# Patient Record
Sex: Male | Born: 1940 | Race: White | Hispanic: No | Smoking: Former smoker
Health system: Southern US, Community
[De-identification: ages and names within clinical notes are randomized; demographics above are authoritative.]

## PROBLEM LIST (undated history)

## (undated) DIAGNOSIS — I480 Paroxysmal atrial fibrillation: Secondary | ICD-10-CM

## (undated) DIAGNOSIS — E11319 Type 2 diabetes mellitus with unspecified diabetic retinopathy without macular edema: Secondary | ICD-10-CM

## (undated) DIAGNOSIS — I2510 Atherosclerotic heart disease of native coronary artery without angina pectoris: Secondary | ICD-10-CM

## (undated) DIAGNOSIS — I4891 Unspecified atrial fibrillation: Secondary | ICD-10-CM

## (undated) DIAGNOSIS — J986 Disorders of diaphragm: Secondary | ICD-10-CM

## (undated) DIAGNOSIS — N200 Calculus of kidney: Secondary | ICD-10-CM

## (undated) DIAGNOSIS — Z8739 Personal history of other diseases of the musculoskeletal system and connective tissue: Secondary | ICD-10-CM

## (undated) DIAGNOSIS — Z9989 Dependence on other enabling machines and devices: Secondary | ICD-10-CM

## (undated) DIAGNOSIS — I428 Other cardiomyopathies: Secondary | ICD-10-CM

## (undated) DIAGNOSIS — C61 Malignant neoplasm of prostate: Secondary | ICD-10-CM

## (undated) DIAGNOSIS — I10 Essential (primary) hypertension: Secondary | ICD-10-CM

## (undated) DIAGNOSIS — H269 Unspecified cataract: Secondary | ICD-10-CM

## (undated) DIAGNOSIS — C434 Malignant melanoma of scalp and neck: Secondary | ICD-10-CM

## (undated) DIAGNOSIS — M1990 Unspecified osteoarthritis, unspecified site: Secondary | ICD-10-CM

## (undated) DIAGNOSIS — E119 Type 2 diabetes mellitus without complications: Secondary | ICD-10-CM

## (undated) DIAGNOSIS — E785 Hyperlipidemia, unspecified: Secondary | ICD-10-CM

## (undated) DIAGNOSIS — G4733 Obstructive sleep apnea (adult) (pediatric): Secondary | ICD-10-CM

## (undated) DIAGNOSIS — H35039 Hypertensive retinopathy, unspecified eye: Secondary | ICD-10-CM

## (undated) DIAGNOSIS — I5032 Chronic diastolic (congestive) heart failure: Secondary | ICD-10-CM

## (undated) HISTORY — PX: LAPAROSCOPIC CHOLECYSTECTOMY: SUR755

## (undated) HISTORY — DX: Dependence on other enabling machines and devices: Z99.89

## (undated) HISTORY — DX: Type 2 diabetes mellitus with unspecified diabetic retinopathy without macular edema: E11.319

## (undated) HISTORY — PX: FINE NEEDLE ASPIRATION: SHX406

## (undated) HISTORY — DX: Chronic diastolic (congestive) heart failure: I50.32

## (undated) HISTORY — DX: Obstructive sleep apnea (adult) (pediatric): G47.33

## (undated) HISTORY — DX: Essential (primary) hypertension: I10

## (undated) HISTORY — PX: ABDOMINAL HERNIA REPAIR: SHX539

## (undated) HISTORY — PX: MELANOMA EXCISION: SHX5266

## (undated) HISTORY — DX: Unspecified cataract: H26.9

## (undated) HISTORY — DX: Hypertensive retinopathy, unspecified eye: H35.039

## (undated) HISTORY — PX: PROSTATECTOMY: SHX69

## (undated) HISTORY — PX: SHOULDER OPEN ROTATOR CUFF REPAIR: SHX2407

## (undated) HISTORY — PX: EXCISIONAL HEMORRHOIDECTOMY: SHX1541

## (undated) HISTORY — PX: HERNIA REPAIR: SHX51

## (undated) HISTORY — DX: Paroxysmal atrial fibrillation: I48.0

## (undated) HISTORY — DX: Unspecified atrial fibrillation: I48.91

---

## 2001-06-12 ENCOUNTER — Ambulatory Visit (HOSPITAL_COMMUNITY): Admission: RE | Admit: 2001-06-12 | Discharge: 2001-06-12 | Payer: Self-pay | Admitting: Orthopaedic Surgery

## 2001-06-12 ENCOUNTER — Encounter: Payer: Self-pay | Admitting: Orthopaedic Surgery

## 2003-03-15 ENCOUNTER — Encounter (HOSPITAL_COMMUNITY): Admission: RE | Admit: 2003-03-15 | Discharge: 2003-04-14 | Payer: Self-pay | Admitting: Orthopaedic Surgery

## 2003-03-15 ENCOUNTER — Encounter: Payer: Self-pay | Admitting: Orthopaedic Surgery

## 2003-09-28 ENCOUNTER — Other Ambulatory Visit: Admission: RE | Admit: 2003-09-28 | Discharge: 2003-09-28 | Payer: Self-pay | Admitting: Urology

## 2005-01-18 ENCOUNTER — Other Ambulatory Visit: Admission: RE | Admit: 2005-01-18 | Discharge: 2005-01-18 | Payer: Self-pay | Admitting: Urology

## 2005-09-20 ENCOUNTER — Other Ambulatory Visit: Admission: RE | Admit: 2005-09-20 | Discharge: 2005-09-20 | Payer: Self-pay | Admitting: Urology

## 2005-11-07 ENCOUNTER — Ambulatory Visit (HOSPITAL_COMMUNITY): Admission: RE | Admit: 2005-11-07 | Discharge: 2005-11-07 | Payer: Self-pay | Admitting: Family Medicine

## 2006-04-25 ENCOUNTER — Ambulatory Visit (HOSPITAL_COMMUNITY): Admission: RE | Admit: 2006-04-25 | Discharge: 2006-04-25 | Payer: Self-pay | Admitting: Family Medicine

## 2006-10-21 ENCOUNTER — Ambulatory Visit (HOSPITAL_COMMUNITY): Admission: RE | Admit: 2006-10-21 | Discharge: 2006-10-22 | Payer: Self-pay | Admitting: Surgery

## 2008-09-17 HISTORY — PX: OTHER SURGICAL HISTORY: SHX169

## 2009-05-25 HISTORY — PX: DOPPLER ECHOCARDIOGRAPHY: SHX263

## 2010-03-27 ENCOUNTER — Ambulatory Visit (HOSPITAL_COMMUNITY): Admission: RE | Admit: 2010-03-27 | Discharge: 2010-03-27 | Payer: Self-pay | Admitting: Family Medicine

## 2010-10-15 DIAGNOSIS — G4733 Obstructive sleep apnea (adult) (pediatric): Secondary | ICD-10-CM

## 2010-10-15 HISTORY — DX: Obstructive sleep apnea (adult) (pediatric): G47.33

## 2011-02-22 ENCOUNTER — Ambulatory Visit (HOSPITAL_COMMUNITY)
Admission: RE | Admit: 2011-02-22 | Discharge: 2011-02-22 | Disposition: A | Discharge status: Home / Self Care | Payer: Medicare Other | Source: Ambulatory Visit | Attending: Family Medicine | Admitting: Family Medicine

## 2011-02-22 ENCOUNTER — Other Ambulatory Visit (HOSPITAL_COMMUNITY): Payer: Self-pay | Admitting: Family Medicine

## 2011-02-22 DIAGNOSIS — W19XXXA Unspecified fall, initial encounter: Secondary | ICD-10-CM

## 2011-02-22 DIAGNOSIS — S2249XA Multiple fractures of ribs, unspecified side, initial encounter for closed fracture: Secondary | ICD-10-CM | POA: Insufficient documentation

## 2011-02-22 DIAGNOSIS — R0789 Other chest pain: Secondary | ICD-10-CM | POA: Insufficient documentation

## 2011-02-22 DIAGNOSIS — X58XXXA Exposure to other specified factors, initial encounter: Secondary | ICD-10-CM | POA: Insufficient documentation

## 2011-03-02 NOTE — Op Note (Signed)
NAME:  Lucas Dudley, Lucas Dudley NO.:  [id number]   MEDICAL RECORD NO.:  [id number]          PATIENT TYPE:  AMB   LOCATION:  DAY                          FACILITY:  [facility]   PHYSICIAN:  Velora Heckler, MD      DATE OF BIRTH:  [birth year]   DATE OF PROCEDURE:  10/21/2006  DATE OF DISCHARGE:                               OPERATIVE REPORT   PREOPERATIVE DIAGNOSIS:  Ventral incisional hernia.   POSTOPERATIVE DIAGNOSIS:  Ventral incisional hernia.   PROCEDURE:  Repair ventral incisional hernia with the Ethicon Proceed  mesh.   SURGEON:  Velora Heckler, MD, FACS   ASSISTANT:  Claud Kelp, MD, FACS   ANESTHESIA:  General.   ESTIMATED BLOOD LOSS:  Minimal.   PREPARATION:  Betadine.   COMPLICATIONS:  None.   INDICATIONS:  Patient is 70 year old white male from [place], [place].  The patient had undergone robotic prostatectomy in [place], [place], in [place].  He developed an incisional hernia  above the level of the umbilicus.  This has gradually enlarged.  He now  comes to surgery for repair.   DESCRIPTION OF PROCEDURE:  Procedure is done in OR #11 at the [facility].  The patient is brought to the operating room,  placed in supine position on the operating room table.  Following  administration of general anesthesia, the patient is prepped and draped  in the usual strict aseptic fashion.  After ascertaining that an  adequate level of anesthesia been obtained, a incision is made in the  left upper quadrant with a #15 blade.  Using an OptiVu port and a 10 mm  fiberoptic scope, the peritoneal cavity is entered without complication.  Abdomen is then insufflated with carbon dioxide.  Laparoscope was  reintroduced through the trocar and into the peritoneal cavity.  There  are adhesions to the site of hernia in the mid abdominal wall.  There  are adhesions in the left lower quadrant.  A 5 mm port is placed in the  left lower quadrant.   A 10 mm port is placed in the right lower quadrant  and a 5 mm port is placed in the right upper quadrant during the course  of the procedure.  Adhesions are taken down with blunt dissection and  hemostasis obtained with the electrocautery.  Entire abdominal wall is  cleared of adhesions.  Fascial defect is above the level of the  umbilicus.  The margins of the defect are marked and then a 4 cm overlap  is accounted for.  A 20 x 15 cm sheet of Ethicon Proceed mesh is  selected.  Eight 0 Novofil sutures are placed circumferentially.  Corresponding incisions are made on the skin.  Mesh is rolled and  inserted through a 10 mm trocar into the peritoneal cavity.  It is  unrolled and oriented properly.  Sutures are retrieved through stab  wounds at eight positions circumferentially around the margins of the  mesh.  Sutures are then pulled taut elevating the mesh against the  anterior abdominal wall with  nice approximation and no significant  redundancy.  Sutures are all tied securely and suture tails excised.  Mesh is secured to the abdominal wall with two concentric circles of  titanium tacks.  Good hemostasis is noted.  Ports are removed under  direct vision.  Good hemostasis is noted at port sites.  Pneumoperitoneum is released.  All ports are removed.  Port sites are  anesthetized with local anesthetic.  Port site wounds are closed with  interrupted 4-0 Monocryl subcuticular sutures.  All wounds are washed  and dried and Benzoin and Steri-Strips are applied to all wounds.  Dressings are applied.  The patient is awakened from anesthesia and  brought to the recovery room in stable condition.  The patient tolerated  the procedure well.      Velora Heckler, MD  Electronically Signed     TMG/MEDQ  D:  10/21/2006  T:  10/21/2006  Job:  [id number]   cc:   Marcha Solders, M.D.   Mila Homer. Sudie Bailey, M.D.  Fax: [phone]

## 2011-09-17 ENCOUNTER — Other Ambulatory Visit (HOSPITAL_COMMUNITY): Payer: Self-pay | Admitting: Family Medicine

## 2011-09-17 ENCOUNTER — Ambulatory Visit (HOSPITAL_COMMUNITY)
Admission: RE | Admit: 2011-09-17 | Discharge: 2011-09-17 | Disposition: A | Discharge status: Home / Self Care | Payer: Medicare Other | Source: Ambulatory Visit | Attending: Family Medicine | Admitting: Family Medicine

## 2011-09-17 DIAGNOSIS — M25529 Pain in unspecified elbow: Secondary | ICD-10-CM

## 2011-09-17 DIAGNOSIS — M25429 Effusion, unspecified elbow: Secondary | ICD-10-CM | POA: Insufficient documentation

## 2012-01-28 ENCOUNTER — Emergency Department (HOSPITAL_COMMUNITY)
Admission: EM | Admit: 2012-01-28 | Discharge: 2012-01-28 | Disposition: A | Discharge status: Home / Self Care | Payer: Medicare Other | Attending: Emergency Medicine | Admitting: Emergency Medicine

## 2012-01-28 ENCOUNTER — Encounter (HOSPITAL_COMMUNITY): Payer: Self-pay | Admitting: *Deleted

## 2012-01-28 DIAGNOSIS — H409 Unspecified glaucoma: Secondary | ICD-10-CM | POA: Insufficient documentation

## 2012-01-28 DIAGNOSIS — I10 Essential (primary) hypertension: Secondary | ICD-10-CM | POA: Insufficient documentation

## 2012-01-28 DIAGNOSIS — E119 Type 2 diabetes mellitus without complications: Secondary | ICD-10-CM | POA: Insufficient documentation

## 2012-01-28 DIAGNOSIS — E785 Hyperlipidemia, unspecified: Secondary | ICD-10-CM | POA: Insufficient documentation

## 2012-01-28 DIAGNOSIS — R0789 Other chest pain: Secondary | ICD-10-CM

## 2012-01-28 DIAGNOSIS — R071 Chest pain on breathing: Secondary | ICD-10-CM | POA: Insufficient documentation

## 2012-01-28 DIAGNOSIS — I252 Old myocardial infarction: Secondary | ICD-10-CM | POA: Insufficient documentation

## 2012-01-28 HISTORY — DX: Hyperlipidemia, unspecified: E78.5

## 2012-01-28 HISTORY — DX: Essential (primary) hypertension: I10

## 2012-01-28 LAB — CBC
MCH: 29 pg (ref 26.0–34.0)
MCHC: 33.1 g/dL (ref 30.0–36.0)
MCV: 87.8 fL (ref 78.0–100.0)
Platelets: 254 10*3/uL (ref 150–400)
RBC: 4.51 MIL/uL (ref 4.22–5.81)
RDW: 13.1 % (ref 11.5–15.5)

## 2012-01-28 LAB — DIFFERENTIAL
Basophils Relative: 0 % (ref 0–1)
Eosinophils Absolute: 0.2 10*3/uL (ref 0.0–0.7)
Eosinophils Relative: 3 % (ref 0–5)
Lymphs Abs: 1.6 10*3/uL (ref 0.7–4.0)

## 2012-01-28 LAB — POCT I-STAT TROPONIN I

## 2012-01-28 LAB — BASIC METABOLIC PANEL
Calcium: 9.3 mg/dL (ref 8.4–10.5)
GFR calc non Af Amer: 71 mL/min — ABNORMAL LOW (ref >90)
Glucose, Bld: 104 mg/dL — ABNORMAL HIGH (ref 70–99)
Sodium: 135 mEq/L (ref 135–145)

## 2012-01-28 MED ORDER — NITROGLYCERIN 0.4 MG SL SUBL
0.4000 mg | SUBLINGUAL_TABLET | Freq: Once | SUBLINGUAL | Status: AC
  Administered 2012-01-28: 0.4 mg via SUBLINGUAL

## 2012-01-28 MED ORDER — MORPHINE SULFATE 2 MG/ML IJ SOLN
2.0000 mg | Freq: Once | INTRAMUSCULAR | Status: DC
  Filled 2012-01-28: qty 1

## 2012-01-28 MED ORDER — NITROGLYCERIN 0.4 MG SL SUBL
0.4000 mg | SUBLINGUAL_TABLET | Freq: Once | SUBLINGUAL | Status: AC
  Administered 2012-01-28: 0.4 mg via SUBLINGUAL
  Filled 2012-01-28: qty 25

## 2012-01-28 MED ORDER — ONDANSETRON HCL 4 MG/2ML IJ SOLN
4.0000 mg | Freq: Once | INTRAMUSCULAR | Status: DC
  Filled 2012-01-28: qty 2

## 2012-01-28 NOTE — ED Notes (Signed)
Pt stated the pain is the same after ntg, when he presses a certain area on right chest wall, can make pain go away

## 2012-01-28 NOTE — ED Notes (Signed)
Pt rates pain 2/10 on scale.

## 2012-01-28 NOTE — ED Notes (Signed)
Pt declined wheelchair to car.

## 2012-01-28 NOTE — ED Notes (Signed)
Per patient's request (nurse approved) - given ice chips

## 2012-01-28 NOTE — ED Provider Notes (Addendum)
History     CSN: [id number]  Arrival date & time 01/28/12  1324   First MD Initiated Contact with Patient 01/28/12 1338      Chief Complaint  Patient presents with  . Chest Pain    (Consider location/radiation/quality/duration/timing/severity/associated sxs/prior treatment) HPI Lucas Dudley is a 71 y.o. male who presents to the Emergency Department complaining of  Chest pain x 2 days. States he has had pain across his anterior chest for two days intermittently. It is worse after eating.Denies fever, chills, nausea, vomiting, shortness of breath. He takes 2 aspirin daily and has taken his daily dose today.  PCP Dr. Sudie Bailey Past Medical History  Diagnosis Date  . Myocardial infarct   . Diabetes mellitus   . Hypertension   . Glaucoma   . Hyperlipidemia     Past Surgical History  Procedure Date  . Cholecystectomy   . Hernia repair   . Shoulder surgery     History reviewed. No pertinent family history.  History  Substance Use Topics  . Smoking status: Former Games developer  . Smokeless tobacco: Not on file  . Alcohol Use: No      Review of Systems  Constitutional: Negative for fever.       10 Systems reviewed and are negative for acute change except as noted in the HPI.  HENT: Negative for congestion.   Eyes: Negative for discharge and redness.  Respiratory: Negative for cough and shortness of breath.   Cardiovascular: Positive for chest pain.  Gastrointestinal: Negative for vomiting and abdominal pain.  Musculoskeletal: Negative for back pain.  Skin: Negative for rash.  Neurological: Negative for syncope, numbness and headaches.  Psychiatric/Behavioral:       No behavior change.    Allergies  Tape  Home Medications  No current outpatient prescriptions on file.  BP 143/70  Pulse 61  Temp(Src) 98.2 F (36.8 C) (Oral)  Resp 18  Ht 5\' 11"  (1.803 m)  Wt 152 lb (68.947 kg)  BMI 21.20 kg/m2  SpO2 96%  Physical Exam  Nursing note and vitals  reviewed. Constitutional:       Awake, alert, nontoxic appearance.  HENT:  Head: Atraumatic.  Eyes: Right eye exhibits no discharge. Left eye exhibits no discharge.  Neck: Neck supple.  Cardiovascular: Normal rate, normal heart sounds and intact distal pulses.   Pulmonary/Chest: Effort normal. He exhibits no tenderness.  Abdominal: Soft. There is no tenderness. There is no rebound.  Musculoskeletal: He exhibits no tenderness.       Baseline ROM, no obvious new focal weakness.  Neurological:       Mental status and motor strength appears baseline for patient and situation.  Skin: No rash noted.  Psychiatric: He has a normal mood and affect.    ED Course  Procedures (including critical care time) Results for orders placed during the hospital encounter of 01/28/12  CBC      Component Value Range   WBC 7.1  4.0 - 10.5 (K/uL)   RBC 4.51  4.22 - 5.81 (MIL/uL)   Hemoglobin 13.1  13.0 - 17.0 (g/dL)   HCT 40.9  81.1 - 91.4 (%)   MCV 87.8  78.0 - 100.0 (fL)   MCH 29.0  26.0 - 34.0 (pg)   MCHC 33.1  30.0 - 36.0 (g/dL)   RDW 78.2  95.6 - 21.3 (%)   Platelets 254  150 - 400 (K/uL)  DIFFERENTIAL      Component Value Range   Neutrophils Relative 67  43 - 77 (%)   Neutro Abs 4.7  1.7 - 7.7 (K/uL)   Lymphocytes Relative 23  12 - 46 (%)   Lymphs Abs 1.6  0.7 - 4.0 (K/uL)   Monocytes Relative 7  3 - 12 (%)   Monocytes Absolute 0.5  0.1 - 1.0 (K/uL)   Eosinophils Relative 3  0 - 5 (%)   Eosinophils Absolute 0.2  0.0 - 0.7 (K/uL)   Basophils Relative 0  0 - 1 (%)   Basophils Absolute 0.0  0.0 - 0.1 (K/uL)  BASIC METABOLIC PANEL      Component Value Range   Sodium 135  135 - 145 (mEq/L)   Potassium 4.6  3.5 - 5.1 (mEq/L)   Chloride 96  96 - 112 (mEq/L)   CO2 29  19 - 32 (mEq/L)   Glucose, Bld 104 (*) 70 - 99 (mg/dL)   BUN 26 (*) 6 - 23 (mg/dL)   Creatinine, Ser 0.45  0.50 - 1.35 (mg/dL)   Calcium 9.3  8.4 - 40.9 (mg/dL)   GFR calc non Af Amer 71 (*) >90 (mL/min)   GFR calc Af Amer 83  (*) >90 (mL/min)  POCT I-STAT TROPONIN I      Component Value Range   Troponin i, poc 0.01  0.00 - 0.08 (ng/mL)   Comment 3             No results found.  Date: 01/28/2012  1336  Rate: 58  Rhythm: sinus bradycardia and 1st degree AV block  QRS Axis: left  Intervals: normal  ST/T Wave abnormalities: nonspecific ST/T changes  Conduction Disutrbances:first-degree A-V block  and right bundle branch block  Narrative Interpretation:   Old EKG Reviewed: none available   MDM  Patient with 2 days of anterior chest pain. Pain has been worse with eating. No response to nitroglycerin while here in the ER.Patient refused morphine. Pain has remained a 1-2/10 since arrival. EKG unremarkable, troponin negative. Labs unremarkable. Discussed results with the patient. He will continue to use aleve for pain. He will follow up with his PCP, Dr. Sudie Bailey.Pt stable in ED with no significant deterioration in condition.The patient appears reasonably screened and/or stabilized for discharge and I doubt any other medical condition or other [facility] requiring further screening, evaluation, or treatment in the ED at this time prior to discharge.  MDM Reviewed: nursing note and vitals Interpretation: labs and ECG          Nicoletta Dress. Colon Branch, MD 01/28/12 1751  Nicoletta Dress. Colon Branch, MD 03/04/12 [phone]

## 2012-01-28 NOTE — ED Notes (Signed)
Pt refused meds ordered for pain and nausea, stated he was "ok and didn't need all that"

## 2012-01-28 NOTE — Discharge Instructions (Signed)
Your blood work, EKG, heart numbers were normal here tonight.You may apply heat for comfort, and continue to use Aleve. If you have recurrent pain or develop other symptoms such as fever chills nausea vomiting diarrhea, return to the emergency room or see Dr. Sudie Bailey.   Chest Wall Pain Chest wall pain is pain in or around the bones and muscles of your chest. It may take up to 6 weeks to get better. It may take longer if you must stay physically active in your work and activities.  CAUSES  Chest wall pain may happen on its own. However, it may be caused by:  A viral illness like the flu.   Injury.   Coughing.   Exercise.   Arthritis.   Fibromyalgia.   Shingles.  HOME CARE INSTRUCTIONS   Avoid overtiring physical activity. Try not to strain or perform activities that cause pain. This includes any activities using your chest or your abdominal and side muscles, especially if heavy weights are used.   Put ice on the sore area.   Put ice in a plastic bag.   Place a towel between your skin and the bag.   Leave the ice on for 15 to 20 minutes per hour while awake for the first 2 days.   Only take over-the-counter or prescription medicines for pain, discomfort, or fever as directed by your caregiver.  SEEK IMMEDIATE MEDICAL CARE IF:   Your pain increases, or you are very uncomfortable.   You have a fever.   Your chest pain becomes worse.   You have new, unexplained symptoms.   You have nausea or vomiting.   You feel sweaty or lightheaded.   You have a cough with phlegm (sputum), or you cough up blood.  MAKE SURE YOU:   Understand these instructions.   Will watch your condition.   Will get help right away if you are not doing well or get worse.  Document Released: 10/01/2005 Document Revised: 09/20/2011 Document Reviewed: 05/28/2011 [facility] Patient Information 2012 [place], [place].

## 2012-01-28 NOTE — ED Notes (Signed)
Chest pain for 2 days , worse today,No sob, No N/v

## 2012-02-21 HISTORY — PX: NM MYOCAR PERF WALL MOTION: HXRAD629

## 2012-05-07 ENCOUNTER — Telehealth (INDEPENDENT_AMBULATORY_CARE_PROVIDER_SITE_OTHER): Payer: Self-pay | Admitting: *Deleted

## 2012-05-07 NOTE — Telephone Encounter (Signed)
Lucas Dudley is asking when his next Colonoscopy is? He saw his PCP and he feels that the patient should have another one. Lucas Dudley may be reached at [phone]

## 2012-05-07 NOTE — Telephone Encounter (Signed)
TCS due 08/2016, patient aware

## 2012-12-29 ENCOUNTER — Ambulatory Visit (HOSPITAL_COMMUNITY)
Admission: RE | Admit: 2012-12-29 | Discharge: 2012-12-29 | Disposition: A | Discharge status: Home / Self Care | Payer: Medicare Other | Source: Ambulatory Visit | Attending: Family Medicine | Admitting: Family Medicine

## 2012-12-29 ENCOUNTER — Other Ambulatory Visit (HOSPITAL_COMMUNITY): Payer: Self-pay | Admitting: Family Medicine

## 2012-12-29 DIAGNOSIS — R059 Cough, unspecified: Secondary | ICD-10-CM | POA: Insufficient documentation

## 2012-12-29 DIAGNOSIS — J209 Acute bronchitis, unspecified: Secondary | ICD-10-CM

## 2013-07-09 ENCOUNTER — Encounter: Payer: Self-pay | Admitting: *Deleted

## 2013-07-10 ENCOUNTER — Ambulatory Visit (INDEPENDENT_AMBULATORY_CARE_PROVIDER_SITE_OTHER): Payer: Medicare Other | Admitting: Cardiovascular Disease

## 2013-07-10 ENCOUNTER — Encounter: Payer: Self-pay | Admitting: Cardiovascular Disease

## 2013-07-10 VITALS — BP 140/70 | HR 69 | Ht 71.0 in | Wt 244.0 lb

## 2013-07-10 DIAGNOSIS — E785 Hyperlipidemia, unspecified: Secondary | ICD-10-CM | POA: Insufficient documentation

## 2013-07-10 DIAGNOSIS — I10 Essential (primary) hypertension: Secondary | ICD-10-CM | POA: Insufficient documentation

## 2013-07-10 DIAGNOSIS — E119 Type 2 diabetes mellitus without complications: Secondary | ICD-10-CM

## 2013-07-10 NOTE — Assessment & Plan Note (Signed)
On statin therapy followed by his PCP 

## 2013-07-10 NOTE — Assessment & Plan Note (Signed)
Under good control on current medications 

## 2013-07-10 NOTE — Patient Instructions (Addendum)
Your physician wants you to follow-up in: 1 year with Dr Berry. You will receive a reminder letter in the mail two months in advance. If you don't receive a letter, please call our office to schedule the follow-up appointment.  

## 2013-07-10 NOTE — Progress Notes (Signed)
07/10/2013 Lucas Dudley   [birth year]  [id number]  Primary Physician Lucas Obey, MD Primary Cardiologist: Lucas Gess MD Lucas Dudley   HPI:  The patient is a 72 year old, mild to mildly overweight, married Caucasian male, father of 1, grandfather to 2 grandchildren who I last saw in the office in September of last year. He is retired from [employer]. His risk factors include hypertension, hyperlipidemia, as well as diabetes and remote tobacco abuse having quit 25 years ago. His mother did die of an MI at age 57. He has never had a heart attack or stroke. His last stress test was performed approximately 3 years ago and was nonischemic. He was recently seen in the ER with chest pain, rule out MI. It was thought that his pain was non-cardiac, though he had had chest pain for several days prior to that as well. He does have a history of having had a cholecystectomy in the past.  I saw him in the office 01/31/12. He's been completely asymptomatic since that time.   Current Outpatient Prescriptions  Medication Sig Dispense Refill  . aspirin EC 325 MG tablet Take 325 mg by mouth 2 (two) times daily with a meal.      . fish oil-omega-3 fatty acids 1000 MG capsule Take 1 g by mouth daily.      [name] gemfibrozil (LOPID) 600 MG tablet Take 600 mg by mouth 2 (two) times daily.      [name] glimepiride (AMARYL) 2 MG tablet Take 1 mg by mouth 2 (two) times daily.      [name] latanoprost (XALATAN) 0.005 % ophthalmic solution Place 1 drop into both eyes at bedtime.      [name] lisinopril (PRINIVIL,ZESTRIL) 20 MG tablet Take 20 mg by mouth daily.      . metFORMIN (GLUCOPHAGE) 500 MG tablet Take 1,000 mg by mouth 2 (two) times daily.      . propafenone (RYTHMOL SR) 325 MG 12 hr capsule Take 325 mg by mouth 2 (two) times daily.       No current facility-administered medications for this visit.    Allergies  Allergen Reactions  . Tape Other (See Comments)    Causes skin redness    History   Social  History  . Marital Status: Married    Spouse Name: N/A    Number of Children: N/A  . Years of Education: N/A   Occupational History  . Not on file.   Social History Main Topics  . Smoking status: Former Games developer  . Smokeless tobacco: Not on file  . Alcohol Use: No  . Drug Use: No  . Sexual Activity: Not on file   Other Topics Concern  . Not on file   Social History Narrative  . No narrative on file     Review of Systems: General: negative for chills, fever, night sweats or weight changes.  Cardiovascular: negative for chest pain, dyspnea on exertion, edema, orthopnea, palpitations, paroxysmal nocturnal dyspnea or shortness of breath Dermatological: negative for rash Respiratory: negative for cough or wheezing Urologic: negative for hematuria Abdominal: negative for nausea, vomiting, diarrhea, bright red blood per rectum, melena, or hematemesis Neurologic: negative for visual changes, syncope, or dizziness All other systems reviewed and are otherwise negative except as noted above.    Blood pressure 140/70, pulse 69, height 5\' 11"  (1.803 m), weight 244 lb (110.678 kg).  General appearance: alert and no distress Neck: no adenopathy, no carotid bruit, no JVD, supple, symmetrical, trachea midline and thyroid  not enlarged, symmetric, no tenderness/mass/nodules Lungs: clear to auscultation bilaterally Heart: regular rate and rhythm, S1, S2 normal, no murmur, click, rub or gallop  EKG sinus rhythm at 69 with incomplete right bundle branch block and left anterior fascicular block  ASSESSMENT AND PLAN:   Essential hypertension Under good control on current medications  Hyperlipidemia On statin therapy followed by his PCP      Lucas Gess MD [facility], [facility] 07/10/2013 2:10 PM

## 2013-07-13 ENCOUNTER — Ambulatory Visit: Payer: Medicare Other | Admitting: Cardiovascular Disease

## 2013-09-25 ENCOUNTER — Encounter: Payer: Self-pay | Admitting: Cardiovascular Disease

## 2013-10-28 ENCOUNTER — Telehealth (INDEPENDENT_AMBULATORY_CARE_PROVIDER_SITE_OTHER): Payer: Self-pay | Admitting: *Deleted

## 2013-10-28 NOTE — Telephone Encounter (Signed)
I have called the patient's home number, [phone], and left a message asking that he call me , so that I can get more information about his bleeding. I also called his cell number @ [phone] ,no answer and no message left.

## 2013-10-28 NOTE — Telephone Encounter (Signed)
His next Colonoscopy is sue 09/03/16.

## 2013-10-28 NOTE — Telephone Encounter (Signed)
Red Blood coming from rectum. Patient advised not apt available until April. Refused to see Terri. His return phone number is [phone].

## 2013-10-29 NOTE — Telephone Encounter (Signed)
Mr. Whidbee states that he sees blood when he has a bowel movement. It is in the toilet bowl and on the tissue paper. He states there has been no changes in his bowel pattern as he has never been regular, at times constipation.  Then he states that when he does go the first time , he will go  3-4 more times after that. Denies any abdominal pain , just states that there are times he feels uneasy on his stomach. Forwarded to Dr.Rehman to advise about office visit with him.

## 2013-10-29 NOTE — Telephone Encounter (Signed)
Patient was called and made aware to be here 10/30/13 @ 9:30 am.

## 2013-10-29 NOTE — Telephone Encounter (Signed)
OV in am. Please arrange.

## 2013-10-30 ENCOUNTER — Ambulatory Visit (INDEPENDENT_AMBULATORY_CARE_PROVIDER_SITE_OTHER): Payer: Medicare Other | Admitting: Internal Medicine

## 2013-10-30 ENCOUNTER — Encounter (INDEPENDENT_AMBULATORY_CARE_PROVIDER_SITE_OTHER): Payer: Self-pay | Admitting: Internal Medicine

## 2013-10-30 VITALS — BP 128/72 | HR 74 | Temp 98.2°F | Resp 18 | Ht 71.0 in | Wt 243.7 lb

## 2013-10-30 DIAGNOSIS — K921 Melena: Secondary | ICD-10-CM | POA: Insufficient documentation

## 2013-10-30 MED ORDER — DOCUSATE SODIUM 100 MG PO CAPS: 200.0000 mg | ORAL_CAPSULE | Freq: Every day | ORAL | Status: DC

## 2013-10-30 NOTE — Patient Instructions (Signed)
High fiber diet. Keep symptom diary as to frequency of rectal bleeding. Progress report in one month.

## 2013-10-30 NOTE — Progress Notes (Signed)
Presenting complaint;  Rectal bleeding.  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 He reports passing blood with his bowel movement 2 days ago. He recalls that his stool was hard and he strained. He noted blood on the tissue and also in the commode. It was bright red in color. He noted some anorectal pain but denies abdominal pain. He did not have a BM yesterday. He has intermittent constipation. Following this hemorrhoidectomy over 30 years ago he took Medrol for several years but stopped over 10 years ago as advised by Dr. Karie Kirks. He uses Dulcolax on an as-needed basis. When he takes 1 pill he generally has 3-4 bowel movements. He reports smaller caliber of stools and somewhat flat but assessment is patent for several years. He has good appetite and denies weight loss.   Current Medications: Current Outpatient Prescriptions  Medication Sig Dispense Refill  . aspirin EC 325 MG tablet Take 325 mg by mouth daily.       [name] docusate sodium (COLACE) 100 MG capsule Take 100 mg by mouth as needed for mild constipation.      [name] gemfibrozil (LOPID) 600 MG tablet Take 600 mg by mouth 2 (two) times daily.      [name] glimepiride (AMARYL) 2 MG tablet Take 1 mg by mouth 2 (two) times daily.      [name] latanoprost (XALATAN) 0.005 % ophthalmic solution Place 1 drop into both eyes at bedtime.      [name] lisinopril (PRINIVIL,ZESTRIL) 20 MG tablet Take 20 mg by mouth daily.      . metFORMIN (GLUCOPHAGE) 500 MG tablet Take 1,000 mg by mouth 2 (two) times daily.      . propafenone (RYTHMOL SR) 325 MG 12 hr capsule Take 325 mg by mouth 2 (two) times daily.       No current facility-administered medications for this visit.     Objective: Blood pressure 128/72, pulse 74, temperature 98.2 F (36.8 C),  temperature source Oral, resp. rate 18, height 5\' 11"  (1.803 m), weight 243 lb 11.2 oz (110.542 kg). Patient is alert and in no acute distress. Conjunctiva is pink. Sclera is nonicteric Oropharyngeal mucosa is normal. No neck masses or thyromegaly noted. Cardiac exam with regular rhythm normal S1 and S2. No murmur or gallop noted. Lungs are clear to auscultation. Abdomen is full but soft and nontender without organomegaly or masses. Rectal examination reveals no external abnormality. Similarly digital exam does not reveal anal stenosis or tenderness. Stool is brown and guaiac negative. No LE edema or clubbing noted.    Assessment:  #1. Recent episode of hematochezia most likely secondary to hemorrhoids in the setting of constipation. His last colonoscopy was in November 2010 as noted above. If hematochezia persist despite therapy for constipation will consider further evaluation.   Plan:  High-fiber diet. Patient advised not to take Dulcolax. He should take Colace 200 mg by mouth each bedtime. He will call with progress report in 4 weeks.

## 2014-06-14 DIAGNOSIS — C61 Malignant neoplasm of prostate: Secondary | ICD-10-CM | POA: Insufficient documentation

## 2014-06-14 DIAGNOSIS — N200 Calculus of kidney: Secondary | ICD-10-CM | POA: Insufficient documentation

## 2014-06-14 DIAGNOSIS — R109 Unspecified abdominal pain: Secondary | ICD-10-CM | POA: Insufficient documentation

## 2014-06-14 DIAGNOSIS — N529 Male erectile dysfunction, unspecified: Secondary | ICD-10-CM | POA: Insufficient documentation

## 2014-06-14 DIAGNOSIS — R319 Hematuria, unspecified: Secondary | ICD-10-CM | POA: Insufficient documentation

## 2014-06-14 DIAGNOSIS — E1169 Type 2 diabetes mellitus with other specified complication: Secondary | ICD-10-CM | POA: Insufficient documentation

## 2014-06-14 DIAGNOSIS — E119 Type 2 diabetes mellitus without complications: Secondary | ICD-10-CM | POA: Insufficient documentation

## 2014-06-14 DIAGNOSIS — E7800 Pure hypercholesterolemia, unspecified: Secondary | ICD-10-CM | POA: Insufficient documentation

## 2014-06-14 DIAGNOSIS — I10 Essential (primary) hypertension: Secondary | ICD-10-CM | POA: Insufficient documentation

## 2014-07-06 ENCOUNTER — Encounter: Payer: Self-pay | Admitting: Cardiovascular Disease

## 2014-07-06 ENCOUNTER — Ambulatory Visit (INDEPENDENT_AMBULATORY_CARE_PROVIDER_SITE_OTHER): Payer: Medicare Other | Admitting: Cardiovascular Disease

## 2014-07-06 VITALS — BP 132/83 | HR 68 | Ht 71.0 in | Wt 241.7 lb

## 2014-07-06 DIAGNOSIS — I480 Paroxysmal atrial fibrillation: Secondary | ICD-10-CM | POA: Insufficient documentation

## 2014-07-06 DIAGNOSIS — I4891 Unspecified atrial fibrillation: Secondary | ICD-10-CM

## 2014-07-06 DIAGNOSIS — I10 Essential (primary) hypertension: Secondary | ICD-10-CM

## 2014-07-06 DIAGNOSIS — E785 Hyperlipidemia, unspecified: Secondary | ICD-10-CM

## 2014-07-06 NOTE — Patient Instructions (Signed)
Dr Berry wants you to follow-up in 1 year . You will receive a reminder letter in the mail two months in advance. If you don't receive a letter, please call our office to schedule the follow-up appointment. 

## 2014-07-06 NOTE — Assessment & Plan Note (Signed)
In sinus rhythm controlled on Rythmol

## 2014-07-06 NOTE — Assessment & Plan Note (Signed)
On statin therapy followed by his PCP 

## 2014-07-06 NOTE — Progress Notes (Signed)
07/06/2014 Lucas Dudley   [birth year]  [id number]  Primary Physician Robert Bellow, MD Primary Cardiologist: Lorretta Harp MD [name]   HPI:  The patient is a 73 year old, mild to mildly overweight, married Caucasian male, father of 70, grandfather to 2 grandchildren who I last saw in the office in September of last year. He is retired from [employer]. His risk factors include hypertension, hyperlipidemia, as well as diabetes and remote tobacco abuse having quit 25 years ago. His mother did die of an MI at age 60. He has never had a heart attack or stroke. His last stress test was performed approximately 2 years ago was nonischemic. Since I saw him a year ago he denies chest pain or shortness of breath. Dr. Karie Kirks follows his lipid profile closely.   Current Outpatient Prescriptions  Medication Sig Dispense Refill  . aspirin EC 325 MG tablet Take 325 mg by mouth daily.       [name] docusate sodium (COLACE) 100 MG capsule Take 2 capsules (200 mg total) by mouth at bedtime.  10 capsule    . gemfibrozil (LOPID) 600 MG tablet Take 600 mg by mouth 2 (two) times daily.      [name] glimepiride (AMARYL) 2 MG tablet Take 1 mg by mouth 2 (two) times daily.      [name] latanoprost (XALATAN) 0.005 % ophthalmic solution Place 1 drop into both eyes at bedtime.      [name] lisinopril (PRINIVIL,ZESTRIL) 20 MG tablet Take 20 mg by mouth daily.      . metFORMIN (GLUCOPHAGE) 500 MG tablet Take 1,000 mg by mouth 2 (two) times daily.      . Omega-3 Fatty Acids (FISH OIL) 1000 MG CAPS Take 1,000 mg by mouth 2 (two) times daily.      . propafenone (RYTHMOL SR) 325 MG 12 hr capsule Take 325 mg by mouth 2 (two) times daily.       No current facility-administered medications for this visit.    Allergies  Allergen Reactions  . Tape Other (See Comments) and Rash    Causes skin redness    History   Social History  . Marital Status: Married    Spouse Name: N/A    Number of Children: N/A  . Years of  Education: N/A   Occupational History  . Not on file.   Social History Main Topics  . Smoking status: Former Smoker    Types: Cigarettes    Quit date: 10/31/1983  . Smokeless tobacco: Not on file     Comment: Patient smoked for 35 years,smoked 1 ppk to 1 1/2 pack a day  . Alcohol Use: No  . Drug Use: No  . Sexual Activity: Not on file   Other Topics Concern  . Not on file   Social History Narrative  . No narrative on file     Review of Systems: General: negative for chills, fever, night sweats or weight changes.  Cardiovascular: negative for chest pain, dyspnea on exertion, edema, orthopnea, palpitations, paroxysmal nocturnal dyspnea or shortness of breath Dermatological: negative for rash Respiratory: negative for cough or wheezing Urologic: negative for hematuria Abdominal: negative for nausea, vomiting, diarrhea, bright red blood per rectum, melena, or hematemesis Neurologic: negative for visual changes, syncope, or dizziness All other systems reviewed and are otherwise negative except as noted above.    Blood pressure 132/83, pulse 68, height 5\' 11"  (1.803 m), weight 241 lb 11.2 oz (109.634 kg).  General appearance: alert and no  distress Neck: no adenopathy, no carotid bruit, no JVD, supple, symmetrical, trachea midline and thyroid not enlarged, symmetric, no tenderness/mass/nodules Lungs: clear to auscultation bilaterally Heart: regular rate and rhythm, S1, S2 normal, no murmur, click, rub or gallop Extremities: extremities normal, atraumatic, no cyanosis or edema  EKG normal sinus rhythm at 68 without ST or T wave changes  ASSESSMENT AND PLAN:   Essential hypertension Controlled on current medications  Hyperlipidemia On statin therapy followed by his PCP  Paroxysmal atrial fibrillation In sinus rhythm controlled on Rythmol      Lorretta Harp MD [facility], [facility] 07/06/2014 2:01 PM

## 2014-07-06 NOTE — Assessment & Plan Note (Signed)
Controlled on current medications 

## 2014-12-08 ENCOUNTER — Telehealth: Payer: Self-pay | Admitting: Cardiovascular Disease

## 2014-12-08 DIAGNOSIS — Z79899 Other long term (current) drug therapy: Secondary | ICD-10-CM

## 2014-12-08 NOTE — Telephone Encounter (Signed)
Lasix 20 mg daily as needed for dyspea/edema; fu with Dr Gwenlyn Found; bmet 2 weeks with results to Dr Helayne Seminole

## 2014-12-08 NOTE — Telephone Encounter (Signed)
Mr. Burchill is calling because he is having some bad shortness of breath . Some days are better than other and when he lays down at night , that is when it bothers him the worst . Please call   Thanks

## 2014-12-08 NOTE — Telephone Encounter (Signed)
LMTCB

## 2014-12-08 NOTE — Telephone Encounter (Addendum)
Pt reports dyspnea x~1 month, typically worse at night. Asked him about recent weight gain, he denies. However, he does report swelling around legs during the day that resolves overnight that has been occurrent for "a long time".  He thinks historically he was on lisinopril-HCTZ but unsure. - currently not, taking just the lisinopril.  He is not on diuretic - when asked, states "if i took a fluid pill, i'd have to pee all the time". I did state that yes, this was the intended effect if he has fluid gain.  Denies cough. He did take ABs recently that were provided "a while back". Thinks these helped, but unsure. He has no recent PCP visit but has f/u this coming Monday.  Recommended he keep PCP appt and if he gets worse to call us. He did not express interest in taking diuretics.   Please advise.

## 2014-12-09 MED ORDER — FUROSEMIDE 20 MG PO TABS: 20.0000 mg | ORAL_TABLET | Freq: Every day | ORAL | Status: DC | PRN

## 2014-12-09 MED ORDER — FUROSEMIDE 20 MG PO TABS: 20.0000 mg | ORAL_TABLET | Freq: Every day | ORAL | Status: DC

## 2014-12-09 NOTE — Telephone Encounter (Signed)
Called patient, set up f/u appt w/ Dr. Gwenlyn Found, ordered Lasix 20mg  daily PRN, ordered BMET for 2 weeks out at [facility]. Pt voiced understanding of given instructions, told to call for any needs.

## 2015-01-18 ENCOUNTER — Encounter: Payer: Self-pay | Admitting: Cardiovascular Disease

## 2015-01-18 ENCOUNTER — Ambulatory Visit (INDEPENDENT_AMBULATORY_CARE_PROVIDER_SITE_OTHER): Payer: Medicare Other | Admitting: Cardiovascular Disease

## 2015-01-18 VITALS — BP 140/80 | HR 97 | Ht 70.0 in | Wt 242.0 lb

## 2015-01-18 DIAGNOSIS — R0609 Other forms of dyspnea: Secondary | ICD-10-CM | POA: Insufficient documentation

## 2015-01-18 DIAGNOSIS — R0602 Shortness of breath: Secondary | ICD-10-CM | POA: Diagnosis not present

## 2015-01-18 DIAGNOSIS — I480 Paroxysmal atrial fibrillation: Secondary | ICD-10-CM | POA: Diagnosis not present

## 2015-01-18 DIAGNOSIS — I10 Essential (primary) hypertension: Secondary | ICD-10-CM

## 2015-01-18 DIAGNOSIS — E785 Hyperlipidemia, unspecified: Secondary | ICD-10-CM

## 2015-01-18 MED ORDER — METOPROLOL SUCCINATE ER 25 MG PO TB24: 25.0000 mg | ORAL_TABLET | Freq: Every day | ORAL | Status: DC

## 2015-01-18 MED ORDER — APIXABAN 5 MG PO TABS
5.0000 mg | ORAL_TABLET | Freq: Two times a day (BID) | ORAL | Status: DC
Start: 2015-01-18 — End: 2015-01-19

## 2015-01-18 NOTE — Assessment & Plan Note (Signed)
History of hypertension blood pressure measures 140/80. He is on lisinopril. Continue current meds at current dosing

## 2015-01-18 NOTE — Assessment & Plan Note (Addendum)
History of PAF . He is in atrial fibrillation today on Rythmol. This may be the cause of shortness of breath. We discussed coagulation as well.  He will be started on Eliquis He may need to be cardioverted as an outpatient.

## 2015-01-18 NOTE — Assessment & Plan Note (Signed)
Recent history of shortness of breath several weeks ago as well as orthopnea improved with the addition of oral diuretics. I'm going to obtain a 2-D echo and exercise Myoview to rule out an ischemic etiology.

## 2015-01-18 NOTE — Progress Notes (Addendum)
01/18/2015 [name]   1941/09/09  [id number]  Primary Physician Robert Bellow, MD Primary Cardiologist: Lorretta Harp MD [name]   HPI:  The patient is a 74 year old, mild to mildly overweight, married Caucasian male, father of 73, grandfather to 2 grandchildren who I last saw in the office in September of last year. He is retired from [employer]. His risk factors include hypertension, hyperlipidemia, as well as diabetes and remote tobacco abuse having quit 25 years ago. His mother did die of an MI at age 42. He has never had a heart attack or stroke. His last stress test was performed approximately 3 years ago was nonischemic. Since I saw him 6 months ago he developed dyspnea on exertion and some orthopnea several weeks ago which has ultimately improved with the addition of an oral diuretic. He does get occasional chest tightness.   Current Outpatient Prescriptions  Medication Sig Dispense Refill  . aspirin EC 325 MG tablet Take 325 mg by mouth daily.     [name] docusate sodium (COLACE) 100 MG capsule Take 2 capsules (200 mg total) by mouth at bedtime. 10 capsule   . furosemide (LASIX) 20 MG tablet Take 1 tablet (20 mg total) by mouth daily as needed (for dyspnea or edema). (Patient taking differently: Take 20 mg by mouth daily. ) 30 tablet 3  . gemfibrozil (LOPID) 600 MG tablet Take 600 mg by mouth at bedtime.     [name] glimepiride (AMARYL) 2 MG tablet Take 1 mg by mouth 2 (two) times daily.    [name] latanoprost (XALATAN) 0.005 % ophthalmic solution Place 1 drop into both eyes at bedtime.    [name] lisinopril (PRINIVIL,ZESTRIL) 20 MG tablet Take 20 mg by mouth daily.    . metFORMIN (GLUCOPHAGE) 500 MG tablet Take 1,000 mg by mouth 2 (two) times daily.    . Omega-3 Fatty Acids (FISH OIL) 1000 MG CAPS Take 1,000 mg by mouth daily.     . propafenone (RYTHMOL SR) 325 MG 12 hr capsule Take 325 mg by mouth 2 (two) times daily.     No current facility-administered medications for this  visit.    Allergies  Allergen Reactions  . Tape Other (See Comments) and Rash    Causes skin redness    History   Social History  . Marital Status: Married    Spouse Name: N/A  . Number of Children: N/A  . Years of Education: N/A   Occupational History  . Not on file.   Social History Main Topics  . Smoking status: Former Smoker    Types: Cigarettes    Quit date: 10/31/1983  . Smokeless tobacco: Not on file     Comment: Patient smoked for 35 years,smoked 1 ppk to 1 1/2 pack a day  . Alcohol Use: No  . Drug Use: No  . Sexual Activity: Not on file   Other Topics Concern  . Not on file   Social History Narrative     Review of Systems: General: negative for chills, fever, night sweats or weight changes.  Cardiovascular: negative for chest pain, dyspnea on exertion, edema, orthopnea, palpitations, paroxysmal nocturnal dyspnea or shortness of breath Dermatological: negative for rash Respiratory: negative for cough or wheezing Urologic: negative for hematuria Abdominal: negative for nausea, vomiting, diarrhea, bright red blood per rectum, melena, or hematemesis Neurologic: negative for visual changes, syncope, or dizziness All other systems reviewed and are otherwise negative except as noted above.    Blood pressure 140/80,  pulse 97, height 5\' 10"  (1.778 m), weight 242 lb (109.77 kg).  General appearance: alert and no distress Neck: no adenopathy, no carotid bruit, no JVD, supple, symmetrical, trachea midline and thyroid not enlarged, symmetric, no tenderness/mass/nodules Lungs: clear to auscultation bilaterally Heart: regular rate and rhythm, S1, S2 normal, no murmur, click, rub or gallop Extremities: extremities normal, atraumatic, no cyanosis or edema  EKG AFIB with a VR of 97 within the lipoma branch block and left anterior fascicular block. I personally reviewed this EKG  ASSESSMENT AND PLAN:   Paroxysmal atrial fibrillation History of PAF maintaining sinus  rhythm on Rythmol   Hyperlipidemia History of hyperlipidemia on gemfibrozil followed by his PCP   Essential hypertension History of hypertension blood pressure measures 140/80. He is on lisinopril. Continue current meds at current dosing   Shortness of breath Recent history of shortness of breath several weeks ago as well as orthopnea improved with the addition of oral diuretics. I'm going to obtain a 2-D echo and exercise Myoview to rule out an ischemic etiology.       Lorretta Harp MD FACP,FACC,FAHA, [facility] 01/18/2015 4:24 PM

## 2015-01-18 NOTE — Patient Instructions (Addendum)
We will see you back in follow up in 4-6 weeks with Dr Gwenlyn Found.  Dr Gwenlyn Found has ordered: 1. Lexiscan Myoview- this is a test that looks at the blood flow to your heart muscle.  It takes approximately 2 1/2 hours. Please follow instruction sheet, as given.   2.  Echocardiogram. Echocardiography is a painless test that uses sound waves to create images of your heart. It provides your doctor with information about the size and shape of your heart and how well your heart's chambers and valves are working. This procedure takes approximately one hour. There are no restrictions for this procedure.   3. Start Eliquis 5mg  twice a day (the prescription has been sent to the pharmacy for you)  4. Have blood work done today on your way out.  5. Start Toprol XL 25mg  daily (the prescription has been sent to the pharmacy for you)  6. Your Doctor has ordered you to wear a heart monitor. You will wear this for 30 days.   TIPS -  REMINDERS 1. The sensor is the lanyard that is worn around your neck every day - this is powered by a battery that needs to be changed every day 2. The monitor is the device that allows you to record symptoms - this will need to be charged daily 3. The sensor & monitor need to be within 100 feet of each other at all times 4. The sensor connects to the electrodes (stickers) - these should be changed every 24-48 hours (you do not have to remove them when you bathe, just make sure they are dry when you connect it back to the sensor 5. If you need more supplies (electrodes, batteries), please call the 1-800 # on the back of the pamphlet and [employer] will mail you more supplies 6. If your skin becomes sensitive, please try the sample pack of sensitive skin electrodes (the white packet in your silver box) and call [employer] to have them mail you more of these type of electrodes 7. When you are finish wearing the monitor, please place all supplies back in the silver box, place the silver box  in the pre-packaged UPS bag and drop off at [employer] or call them so they can come pick it up   Cardiac Event Monitoring A cardiac event monitor is a small recording device used to help detect abnormal heart rhythms (arrhythmias). The monitor is used to record heart rhythm when noticeable symptoms such as the following occur:  Fast heartbeats (palpitations), such as heart racing or fluttering.  Dizziness.  Fainting or light-headedness.  Unexplained weakness. The monitor is wired to two electrodes placed on your chest. Electrodes are flat, sticky disks that attach to your skin. The monitor can be worn for up to 30 days. You will wear the monitor at all times, except when bathing.  HOW TO USE YOUR CARDIAC EVENT MONITOR A technician will prepare your chest for the electrode placement. The technician will show you how to place the electrodes, how to work the monitor, and how to replace the batteries. Take time to practice using the monitor before you leave the office. Make sure you understand how to send the information from the monitor to your health care provider. This requires a telephone with a landline, not a cell phone. You need to:  Wear your monitor at all times, except when you are in water:  Do not get the monitor wet.  Take the monitor off when bathing. Do not swim or use  a hot tub with it on.  Keep your skin clean. Do not put body lotion or moisturizer on your chest.  Change the electrodes daily or any time they stop sticking to your skin. You might need to use tape to keep them on.  It is possible that your skin under the electrodes could become irritated. To keep this from happening, try to put the electrodes in slightly different places on your chest. However, they must remain in the area under your left breast and in the upper right section of your chest.  Make sure the monitor is safely clipped to your clothing or in a location close to your body that your health care provider  recommends.  Press the button to record when you feel symptoms of heart trouble, such as dizziness, weakness, light-headedness, palpitations, thumping, shortness of breath, unexplained weakness, or a fluttering or racing heart. The monitor is always on and records what happened slightly before you pressed the button, so do not worry about being too late to get good information.  Keep a diary of your activities, such as walking, doing chores, and taking medicine. It is especially important to note what you were doing when you pushed the button to record your symptoms. This will help your health care provider determine what might be contributing to your symptoms. The information stored in your monitor will be reviewed by your health care provider alongside your diary entries.  Send the recorded information as recommended by your health care provider. It is important to understand that it will take some time for your health care provider to process the results.  Change the batteries as recommended by your health care provider. SEEK IMMEDIATE MEDICAL CARE IF:   You have chest pain.  You have extreme difficulty breathing or shortness of breath.  You develop a very fast heartbeat that persists.  You develop dizziness that does not go away.  You faint or constantly feel you are about to faint. Document Released: 07/10/2008 Document Revised: 02/15/2014 Document Reviewed: 03/30/2013 [facility] Patient Information 2015 [place], [place]. This information is not intended to replace advice given to you by your health care provider. Make sure you discuss any questions you have with your health care provider.

## 2015-01-18 NOTE — Assessment & Plan Note (Signed)
History of hyperlipidemia on gemfibrozil followed by his PCP

## 2015-01-19 ENCOUNTER — Telehealth: Payer: Self-pay | Admitting: Cardiovascular Disease

## 2015-01-19 MED ORDER — APIXABAN 5 MG PO TABS: 5.0000 mg | ORAL_TABLET | Freq: Two times a day (BID) | ORAL | Status: DC

## 2015-01-19 MED ORDER — METOPROLOL SUCCINATE ER 25 MG PO TB24: 25.0000 mg | ORAL_TABLET | Freq: Every day | ORAL | Status: DC

## 2015-01-19 NOTE — Telephone Encounter (Signed)
Pt calling back again,he says he wants his medicine called in to another pharmacy. He wants it called to [facility]-[phone], he needs new prescriptions for Eliquis and Metoprolol. He wants #90 and refills please.Please call him when you complete this.

## 2015-01-19 NOTE — Telephone Encounter (Signed)
E-sent to [facility] MAIL ORDER PATIENT AWARE

## 2015-01-19 NOTE — Telephone Encounter (Signed)
Patient needs to speak with a nurse regarding his medications----"I need to get these straightened out".

## 2015-01-20 ENCOUNTER — Other Ambulatory Visit: Payer: Self-pay | Admitting: Cardiovascular Disease

## 2015-01-21 LAB — BASIC METABOLIC PANEL
BUN/Creatinine Ratio: 27 — ABNORMAL HIGH (ref 10–22)
BUN: 37 mg/dL — ABNORMAL HIGH (ref 8–27)
CHLORIDE: 95 mmol/L — AB (ref 97–108)
CO2: 28 mmol/L (ref 18–29)
Calcium: 9.4 mg/dL (ref 8.6–10.2)
Creatinine, Ser: 1.35 mg/dL — ABNORMAL HIGH (ref 0.76–1.27)
GFR calc Af Amer: 60 mL/min/{1.73_m2} (ref >59)
GFR calc non Af Amer: 52 mL/min/{1.73_m2} — ABNORMAL LOW (ref >59)
GLUCOSE: 234 mg/dL — AB (ref 65–99)
POTASSIUM: 4.7 mmol/L (ref 3.5–5.2)
Sodium: 137 mmol/L (ref 134–144)

## 2015-02-02 ENCOUNTER — Telehealth: Payer: Self-pay | Admitting: Cardiovascular Disease

## 2015-02-02 NOTE — Telephone Encounter (Signed)
Spoke with Audrea Muscat who is trying to appeal the denial for the patient's event monitor. They need reason why a traditional holter was not ordered and Audrea Muscat was informed that since patient has PAF an event monitor was ordered as it has the flexibility of a longer duration to capture date of a extended period of time.

## 2015-02-02 NOTE — Telephone Encounter (Signed)
Lucas Dudley will get this information to their review team and contact patient/office with outcome

## 2015-02-02 NOTE — Telephone Encounter (Signed)
Lucas Dudley called in stating the his insurance denied his Cardiac Event Monitor . She needs some additional information in order to have this approved. Please call  Thanks

## 2015-02-04 ENCOUNTER — Telehealth: Payer: Self-pay | Admitting: Cardiovascular Disease

## 2015-02-04 ENCOUNTER — Telehealth (HOSPITAL_COMMUNITY): Payer: Self-pay

## 2015-02-04 NOTE — Telephone Encounter (Signed)
Maryann from [employer] called back regarding Card[employer] monitor.  Per her report, initial appeal denied, this is automatically elevated to next level of appeal processing for overturning/approval.  She explains pt would not be responsible for cost of monitor regardless as Card[employer] placed prior to approval.

## 2015-02-04 NOTE — Telephone Encounter (Signed)
Audrea Muscat is calling in to give the appeal outcome for the pt's cardiac event monitor. Please f/u with her   Thanks

## 2015-02-04 NOTE — Telephone Encounter (Signed)
Left message requesting  call back.

## 2015-02-04 NOTE — Telephone Encounter (Signed)
Encounter complete. 

## 2015-02-08 ENCOUNTER — Telehealth: Payer: Self-pay | Admitting: *Deleted

## 2015-02-08 ENCOUNTER — Telehealth (HOSPITAL_COMMUNITY): Payer: Self-pay

## 2015-02-08 NOTE — Telephone Encounter (Signed)
Re: appeal r/t monitor cost - coverage by insurance  Phone (Incoming) Lucas Dudley- Medicare Appeals (Other) [phone]   Lucas Dudley left message on my machine w/ further instructions r/t last note. States the appeal process has been elevated to outside agency - for considered coverage of monitor, they are needing pt's medical records from 10-15-14 to most recent date of service, & a signed & dated statement of medical necessity.  They request this before EOB on Friday.  Discussed w/ Lucas Dudley, she will draft letter, I will ready other documents.  Return Lucas Dudley's call to obtain fax number. [phone]

## 2015-02-08 NOTE — Telephone Encounter (Signed)
Encounter complete. 

## 2015-02-09 ENCOUNTER — Ambulatory Visit (HOSPITAL_COMMUNITY)
Admission: RE | Admit: 2015-02-09 | Discharge: 2015-02-09 | Disposition: A | Discharge status: Home / Self Care | Payer: Medicare Other | Source: Ambulatory Visit | Attending: Cardiology | Admitting: Cardiology

## 2015-02-09 DIAGNOSIS — E119 Type 2 diabetes mellitus without complications: Secondary | ICD-10-CM | POA: Diagnosis not present

## 2015-02-09 DIAGNOSIS — R0602 Shortness of breath: Secondary | ICD-10-CM

## 2015-02-09 DIAGNOSIS — I480 Paroxysmal atrial fibrillation: Secondary | ICD-10-CM | POA: Diagnosis not present

## 2015-02-09 DIAGNOSIS — I10 Essential (primary) hypertension: Secondary | ICD-10-CM

## 2015-02-09 DIAGNOSIS — Z87891 Personal history of nicotine dependence: Secondary | ICD-10-CM | POA: Insufficient documentation

## 2015-02-09 DIAGNOSIS — R0600 Dyspnea, unspecified: Secondary | ICD-10-CM | POA: Diagnosis present

## 2015-02-09 DIAGNOSIS — E785 Hyperlipidemia, unspecified: Secondary | ICD-10-CM

## 2015-02-09 MED ORDER — TECHNETIUM TC 99M SESTAMIBI GENERIC - CARDIOLITE
32.0000 | Freq: Once | INTRAVENOUS | Status: AC | PRN
  Administered 2015-02-09: 32 via INTRAVENOUS

## 2015-02-09 MED ORDER — TECHNETIUM TC 99M SESTAMIBI GENERIC - CARDIOLITE
10.8000 | Freq: Once | INTRAVENOUS | Status: AC | PRN
  Administered 2015-02-09: 11 via INTRAVENOUS

## 2015-02-09 MED ORDER — REGADENOSON 0.4 MG/5ML IV SOLN
0.4000 mg | Freq: Once | INTRAVENOUS | Status: AC
  Administered 2015-02-09: 0.4 mg via INTRAVENOUS

## 2015-02-09 NOTE — Procedures (Addendum)
[facility] [facility] [facility] [address] [place] [address] [phone]  Cardiology Nuclear Med Study  Lucas Dudley is a 74 y.o. male     MRN : [id number]     DOB: Nov 11, 1940  Procedure Date: 02/09/2015  Nuclear Med Background Indication for Stress Test:  Evaluation for Ischemia and Abnormal EKG History:  Asthma and CAD;MI;Last NUC MPI on 02/21/2012-normal;EF=61%;AFIB; Cardiac Risk Factors: Family History - CAD, History of Smoking, Hypertension, Lipids, NIDDM and Obesity  Symptoms:  Chest Pain, Dizziness, DOE, Fatigue, Light-Headedness and SOB   Nuclear Pre-Procedure Caffeine/Decaff Intake:  9:30pm NPO After: 5:30am   IV Site: R Forearm  IV 0.9% NS with Angio Cath:  22g  Chest Size (in):  46" IV Started by: Larene Beach, RN  Height: 5\' 10"  (1.778 m)  Cup Size: n/a  BMI:  Body mass index is 34.72 kg/(m^2). Weight:  242 lb (109.77 kg)   Tech Comments:  n/a    Nuclear Med Study 1 or 2 day study: 1 day  Stress Test Type:  [place] Provider:  Quay Burow, MD   Resting Radionuclide: Technetium 8m Sestamibi  Resting Radionuclide Dose: 10.8 mCi   Stress Radionuclide:  Technetium 48m Sestamibi  Stress Radionuclide Dose: 32.0 mCi           Stress Protocol Rest HR: 100 Stress HR: 110  Rest BP: 117/92 Stress BP: 116/92  Exercise Time (min): n/a METS: n/a          Dose of Adenosine (mg):  n/a Dose of Lexiscan: 0.4 mg  Dose of Atropine (mg): n/a Dose of Dobutamine: n/a mcg/kg/min (at max HR)  Stress Test Technologist: Leane Para, CCT Nuclear Technologist:Elizabeth Young,CNMT   Rest Procedure:  Myocardial perfusion imaging was performed at rest 45 minutes following the intravenous administration of Technetium 16m Sestamibi. Stress Procedure:  The patient received IV Lexiscan 0.4 mg over 15-seconds.  Technetium 21m Sestamibi injected IV at 30-seconds.  There were no significant changes with Lexiscan.   Quantitative spect images were obtained after a 45 minute delay.  Transient Ischemic Dilatation (Normal <1.22):  1.20  LV Ejection Fraction: Study not gated    Rest ECG: Atrial Fibrilliation; cannot R/O prior septal MI  Stress ECG: No significant ST segment change suggestive of ischemia.  QPS Raw Data Images:  Acquisition technically good; LVE. Stress Images:  There is decreased uptake in the inferior wall. Rest Images:  There is decreased uptake in the inferior wall, less prominent compared to the stress images. 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  Impression Exercise Capacity:  Lexiscan with no exercise. BP Response:  Normal blood pressure response. Clinical Symptoms:  There is dyspnea. ECG Impression:  No significant ST segment change suggestive of ischemia. Comparison with Prior Nuclear Study: Compared to 02/21/12, inferior infarct and ischemia new.  Overall Impression:  Low risk stress nuclear study with a large, severe, partially reversible inferior defect consistent with prior inferior MI and mild peri-infarct ischemia.  LV Wall Motion:  Study not gated due to atrial fibrillation.   Kirk Ruths, MD  02/09/2015 5:18 PM

## 2015-02-09 NOTE — Progress Notes (Signed)
2D Echo Performed 02/09/2015    Marygrace Drought, RCS

## 2015-02-10 NOTE — Telephone Encounter (Signed)
Letter created and awaiting Dr Kennon Holter signature

## 2015-02-10 NOTE — Progress Notes (Signed)
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

## 2015-02-11 NOTE — Telephone Encounter (Signed)
Letter and records faxed per request

## 2015-02-11 NOTE — Telephone Encounter (Signed)
Lucas Dudley is calling to check the status of the Medical Necessities form. Fax number is - [phone] Thanks

## 2015-02-14 ENCOUNTER — Telehealth: Payer: Self-pay | Admitting: Cardiovascular Disease

## 2015-02-14 MED ORDER — APIXABAN 5 MG PO TABS: 5.0000 mg | ORAL_TABLET | Freq: Two times a day (BID) | ORAL | Status: DC

## 2015-02-14 NOTE — Telephone Encounter (Signed)
Refill submitted to patient's preferred pharmacy. Informed patient. Pt voiced understanding, no other stated concerns at this time.  

## 2015-02-14 NOTE — Telephone Encounter (Signed)
Returning somebody's call from today,think it was about his test results.

## 2015-02-14 NOTE — Telephone Encounter (Signed)
Pt said his Eliquis was sent in for 45 days and it should have been 90 days. Please call this in again to [facility].[name]

## 2015-02-15 NOTE — Telephone Encounter (Signed)
I spoke with patient and reviewed test results.

## 2015-02-16 ENCOUNTER — Other Ambulatory Visit: Payer: Self-pay

## 2015-02-16 ENCOUNTER — Ambulatory Visit (INDEPENDENT_AMBULATORY_CARE_PROVIDER_SITE_OTHER): Payer: Medicare Other

## 2015-02-16 DIAGNOSIS — I480 Paroxysmal atrial fibrillation: Secondary | ICD-10-CM

## 2015-03-09 ENCOUNTER — Ambulatory Visit (INDEPENDENT_AMBULATORY_CARE_PROVIDER_SITE_OTHER): Payer: Medicare Other | Admitting: Cardiovascular Disease

## 2015-03-09 ENCOUNTER — Encounter: Payer: Self-pay | Admitting: Cardiovascular Disease

## 2015-03-09 VITALS — BP 146/88 | HR 83 | Ht 70.5 in | Wt 237.5 lb

## 2015-03-09 DIAGNOSIS — R9439 Abnormal result of other cardiovascular function study: Secondary | ICD-10-CM

## 2015-03-09 DIAGNOSIS — D689 Coagulation defect, unspecified: Secondary | ICD-10-CM

## 2015-03-09 DIAGNOSIS — R5383 Other fatigue: Secondary | ICD-10-CM

## 2015-03-09 DIAGNOSIS — I10 Essential (primary) hypertension: Secondary | ICD-10-CM

## 2015-03-09 DIAGNOSIS — R5381 Other malaise: Secondary | ICD-10-CM

## 2015-03-09 DIAGNOSIS — E785 Hyperlipidemia, unspecified: Secondary | ICD-10-CM

## 2015-03-09 DIAGNOSIS — Z01818 Encounter for other preprocedural examination: Secondary | ICD-10-CM | POA: Diagnosis not present

## 2015-03-09 NOTE — Assessment & Plan Note (Signed)
History of hypertension with blood pressure measured today at 146/88. He is on lisinopril and metoprolol. Continue current meds at current dosing

## 2015-03-09 NOTE — Assessment & Plan Note (Addendum)
History of PAF. He is on Rythmol and Eliquis . Recent event monitor showed sick sinus syndrome with PAF and positive up to 2.5 sec.

## 2015-03-09 NOTE — Addendum Note (Signed)
Addended by: Diana Eves on: 03/09/2015 05:58 PM   Modules accepted: Orders

## 2015-03-09 NOTE — Patient Instructions (Addendum)
Your physician has requested that you have a cardiac catheterization. Cardiac catheterization is used to diagnose and/or treat various heart conditions. Doctors may recommend this procedure for a number of different reasons. The most common reason is to evaluate chest pain. Chest pain can be a symptom of coronary artery disease (CAD), and cardiac catheterization can show whether plaque is narrowing or blocking your heart's arteries. This procedure is also used to evaluate the valves, as well as measure the blood flow and oxygen levels in different parts of your heart. For further information please visit [email or web address].   Following your catheterization, you will not be allowed to drive for 3 days.  No lifting, pushing, or pulling greater that 10 pounds is allowed for 1 week.  You will be required to have the following tests prior to the procedure:  1. Blood work-the blood work can be done no more than 7 days prior to the procedure.  It can be done at any [facility] lab.  There is one downstairs on the first floor of this building and one in the [place] ([address])  2. Chest Xray-the chest xray order has already been placed at the [facility].     **STOP ELIQUIS 2 DAYS PRIOR TO PROCEDURE**   Right radial  Medication samples have been provided to the patient. Drug name: Eliquis 2.5 mg Qty: 28 tabs LOT: [id number] Exp.Date: 08/2015  Donivan Scull 4:16 PM 03/09/2015

## 2015-03-09 NOTE — Assessment & Plan Note (Signed)
Lucas Dudley has had increasing dyspnea on exertion. Recent 2-D echo showed an EF of 40% which represents a decline it is EF from his 2010 echo which was 50 of 55%. In addition, his Myoview shows inferior scar with peri-infarct ischemia. Based on this, I decided to proceed with outpatient diagnostic coronary arteriography via the right radial approach. I thoroughly discussed the risks and benefits including stroke, heart attack and death.

## 2015-03-09 NOTE — Progress Notes (Signed)
03/09/2015 Lucas Dudley   1941-01-29  [id number]  Primary Physician Lucas Bellow, MD Primary Cardiologist: Lorretta Harp MD [name]   HPI:   The patient is a 74 year old, mild to mildly overweight, married Caucasian male, father of 53, grandfather to 2 grandchildren who I last saw in the office /5/16. He is retired from [employer]. His risk factors include hypertension, hyperlipidemia, as well as diabetes and remote tobacco abuse having quit 25 years ago. His mother did die of an MI at age 77. He has never had a heart attack or stroke. His last stress test was performed approximately 3 years ago was nonischemic. Since I saw him 6 months ago he developed dyspnea on exertion and some orthopnea several weeks ago which has ultimately improved with the addition of an oral diuretic. He does get occasional chest tightness. Recent Myoview stress test performed 02/09/15 showed inferior scar with moderate peri-infarct ischemia and a 2-D echo showed an EF of 40% which represents a decline from 50-55% by echo back in 2010. An event Monitor showed sick sinus syndrome/PAF with pauses up to 2.5 seconds.   Current Outpatient Prescriptions  Medication Sig Dispense Refill  . apixaban (ELIQUIS) 5 MG TABS tablet Take 1 tablet (5 mg total) by mouth 2 (two) times daily. 180 tablet 3  . aspirin EC 325 MG tablet Take 325 mg by mouth daily.     [name] docusate sodium (COLACE) 100 MG capsule Take 2 capsules (200 mg total) by mouth at bedtime. 10 capsule   . furosemide (LASIX) 20 MG tablet Take 1 tablet (20 mg total) by mouth daily as needed (for dyspnea or edema). (Patient taking differently: Take 20 mg by mouth daily. ) 30 tablet 3  . gemfibrozil (LOPID) 600 MG tablet Take 600 mg by mouth at bedtime.     [name] glimepiride (AMARYL) 2 MG tablet Take 1 mg by mouth 2 (two) times daily.    [name] latanoprost (XALATAN) 0.005 % ophthalmic solution Place 1 drop into both eyes at bedtime.    [name] lisinopril  (PRINIVIL,ZESTRIL) 20 MG tablet Take 20 mg by mouth daily.    . metFORMIN (GLUCOPHAGE) 500 MG tablet Take 1,000 mg by mouth 2 (two) times daily.    . metoprolol succinate (TOPROL XL) 25 MG 24 hr tablet Take 1 tablet (25 mg total) by mouth daily. 90 tablet 3  . Omega-3 Fatty Acids (FISH OIL) 1000 MG CAPS Take 1,000 mg by mouth daily.     . propafenone (RYTHMOL SR) 325 MG 12 hr capsule Take 325 mg by mouth 2 (two) times daily.     No current facility-administered medications for this visit.    Allergies  Allergen Reactions  . Tape Other (See Comments) and Rash    Causes skin redness    History   Social History  . Marital Status: Married    Spouse Name: N/A  . Number of Children: N/A  . Years of Education: N/A   Occupational History  . Not on file.   Social History Main Topics  . Smoking status: Former Smoker    Types: Cigarettes    Quit date: 10/31/1983  . Smokeless tobacco: Not on file     Comment: Patient smoked for 35 years,smoked 1 ppk to 1 1/2 pack a day  . Alcohol Use: No  . Drug Use: No  . Sexual Activity: Not on file   Other Topics Concern  . Not on file   Social History Narrative  Review of Systems: General: negative for chills, fever, night sweats or weight changes.  Cardiovascular: negative for chest pain, dyspnea on exertion, edema, orthopnea, palpitations, paroxysmal nocturnal dyspnea or shortness of breath Dermatological: negative for rash Respiratory: negative for cough or wheezing Urologic: negative for hematuria Abdominal: negative for nausea, vomiting, diarrhea, bright red blood per rectum, melena, or hematemesis Neurologic: negative for visual changes, syncope, or dizziness All other systems reviewed and are otherwise negative except as noted above.    Blood pressure 146/88, pulse 83, height 5' 10.5" (1.791 m), weight 237 lb 8 oz (107.729 kg).  General appearance: alert and no distress Neck: no adenopathy, no carotid bruit, no JVD, supple,  symmetrical, trachea midline and thyroid not enlarged, symmetric, no tenderness/mass/nodules Lungs: clear to auscultation bilaterally Heart: irregularly irregular rhythm Extremities: extremities normal, atraumatic, no cyanosis or edema  EKG not performed today  ASSESSMENT AND PLAN:   Shortness of breath [name] has had increasing dyspnea on exertion. Recent 2-D echo showed an EF of 40% which represents a decline it is EF from his 2010 echo which was 50 of 55%. In addition, his Myoview shows inferior scar with peri-infarct ischemia. Based on this, I decided to proceed with outpatient diagnostic coronary arteriography via the right radial approach. I thoroughly discussed the risks and benefits including stroke, heart attack and death.   Paroxysmal atrial fibrillation History of PAF. He is on Rythmol and Eliquis . Recent event monitor showed sick sinus syndrome with PAF and positive up to 2.5 sec.    Hyperlipidemia History of hyperlipidemia on gemfibrozil followed by his PCP   Essential hypertension History of hypertension with blood pressure measured today at 146/88. He is on lisinopril and metoprolol. Continue current meds at current dosing       Lorretta Harp MD [facility], [facility] 03/09/2015 4:05 PM

## 2015-03-09 NOTE — Assessment & Plan Note (Signed)
History of hyperlipidemia on gemfibrozil followed by his PCP

## 2015-03-10 ENCOUNTER — Other Ambulatory Visit: Payer: Self-pay | Admitting: *Deleted

## 2015-03-10 ENCOUNTER — Encounter: Payer: Self-pay | Admitting: Cardiovascular Disease

## 2015-03-10 DIAGNOSIS — Z01818 Encounter for other preprocedural examination: Secondary | ICD-10-CM

## 2015-03-10 DIAGNOSIS — R9439 Abnormal result of other cardiovascular function study: Secondary | ICD-10-CM

## 2015-03-12 LAB — BASIC METABOLIC PANEL
BUN: 26 mg/dL — AB (ref 6–23)
CO2: 28 mEq/L (ref 19–32)
Calcium: 9.1 mg/dL (ref 8.4–10.5)
Chloride: 98 mEq/L (ref 96–112)
Creat: 1.09 mg/dL (ref 0.50–1.35)
GLUCOSE: 201 mg/dL — AB (ref 70–99)
Potassium: 4.3 mEq/L (ref 3.5–5.3)
Sodium: 137 mEq/L (ref 135–145)

## 2015-03-12 LAB — TSH: TSH: 2.663 u[IU]/mL (ref 0.350–4.500)

## 2015-03-12 LAB — CBC
HEMATOCRIT: 42.9 % (ref 39.0–52.0)
HEMOGLOBIN: 14.7 g/dL (ref 13.0–17.0)
MCH: 29.5 pg (ref 26.0–34.0)
MCHC: 34.3 g/dL (ref 30.0–36.0)
MCV: 86.1 fL (ref 78.0–100.0)
MPV: 10 fL (ref 8.6–12.4)
Platelets: 233 10*3/uL (ref 150–400)
RBC: 4.98 MIL/uL (ref 4.22–5.81)
RDW: 14.6 % (ref 11.5–15.5)
WBC: 7.2 10*3/uL (ref 4.0–10.5)

## 2015-03-12 LAB — APTT: aPTT: 34 seconds (ref 24–37)

## 2015-03-12 LAB — PROTIME-INR
INR: 1.15 (ref <1.50)
Prothrombin Time: 14.7 seconds (ref 11.6–15.2)

## 2015-03-13 ENCOUNTER — Encounter: Payer: Self-pay | Admitting: *Deleted

## 2015-03-15 ENCOUNTER — Ambulatory Visit
Admission: RE | Admit: 2015-03-15 | Discharge: 2015-03-15 | Disposition: A | Discharge status: Home / Self Care | Payer: Medicare Other | Source: Ambulatory Visit | Attending: Cardiovascular Disease | Admitting: Cardiovascular Disease

## 2015-03-15 DIAGNOSIS — Z01818 Encounter for other preprocedural examination: Secondary | ICD-10-CM

## 2015-03-17 ENCOUNTER — Ambulatory Visit (HOSPITAL_COMMUNITY)
Admission: RE | Admit: 2015-03-17 | Discharge: 2015-03-17 | Disposition: A | Discharge status: Home / Self Care | Payer: Medicare Other | Source: Ambulatory Visit | Attending: Cardiology | Admitting: Cardiology

## 2015-03-17 ENCOUNTER — Encounter (HOSPITAL_COMMUNITY)
Admission: RE | Disposition: A | Discharge status: Home / Self Care | Payer: Medicare Other | Source: Ambulatory Visit | Attending: Cardiology

## 2015-03-17 DIAGNOSIS — E785 Hyperlipidemia, unspecified: Secondary | ICD-10-CM | POA: Insufficient documentation

## 2015-03-17 DIAGNOSIS — I10 Essential (primary) hypertension: Secondary | ICD-10-CM | POA: Diagnosis not present

## 2015-03-17 DIAGNOSIS — Z01818 Encounter for other preprocedural examination: Secondary | ICD-10-CM

## 2015-03-17 DIAGNOSIS — R0602 Shortness of breath: Secondary | ICD-10-CM | POA: Diagnosis present

## 2015-03-17 DIAGNOSIS — I2510 Atherosclerotic heart disease of native coronary artery without angina pectoris: Secondary | ICD-10-CM

## 2015-03-17 DIAGNOSIS — E119 Type 2 diabetes mellitus without complications: Secondary | ICD-10-CM | POA: Diagnosis not present

## 2015-03-17 DIAGNOSIS — Z87891 Personal history of nicotine dependence: Secondary | ICD-10-CM | POA: Diagnosis not present

## 2015-03-17 DIAGNOSIS — R9439 Abnormal result of other cardiovascular function study: Secondary | ICD-10-CM | POA: Insufficient documentation

## 2015-03-17 DIAGNOSIS — R0609 Other forms of dyspnea: Secondary | ICD-10-CM | POA: Diagnosis present

## 2015-03-17 DIAGNOSIS — I2582 Chronic total occlusion of coronary artery: Secondary | ICD-10-CM | POA: Diagnosis not present

## 2015-03-17 DIAGNOSIS — R931 Abnormal findings on diagnostic imaging of heart and coronary circulation: Secondary | ICD-10-CM | POA: Diagnosis present

## 2015-03-17 HISTORY — PX: CARDIAC CATHETERIZATION: SHX172

## 2015-03-17 LAB — GLUCOSE, CAPILLARY
GLUCOSE-CAPILLARY: 259 mg/dL — AB (ref 65–99)
Glucose-Capillary: 202 mg/dL — ABNORMAL HIGH (ref 65–99)

## 2015-03-17 SURGERY — LEFT HEART CATH AND CORONARY ANGIOGRAPHY
Anesthesia: LOCAL

## 2015-03-17 MED ORDER — SODIUM CHLORIDE 0.9 % IJ SOLN: 3.0000 mL | INTRAMUSCULAR | Status: DC | PRN

## 2015-03-17 MED ORDER — HEPARIN (PORCINE) IN NACL 2-0.9 UNIT/ML-% IJ SOLN
INTRAMUSCULAR | Status: AC
  Filled 2015-03-17: qty 1000

## 2015-03-17 MED ORDER — LIDOCAINE HCL (PF) 1 % IJ SOLN
INTRAMUSCULAR | Status: DC | PRN
  Administered 2015-03-17: 5 mL via INTRADERMAL

## 2015-03-17 MED ORDER — VERAPAMIL HCL 2.5 MG/ML IV SOLN
INTRAVENOUS | Status: AC
  Filled 2015-03-17: qty 2

## 2015-03-17 MED ORDER — SODIUM CHLORIDE 0.9 % WEIGHT BASED INFUSION: 1.0000 mL/kg/h | INTRAVENOUS | Status: DC

## 2015-03-17 MED ORDER — ASPIRIN 81 MG PO CHEW: 81.0000 mg | CHEWABLE_TABLET | ORAL | Status: DC

## 2015-03-17 MED ORDER — SODIUM CHLORIDE 0.9 % IJ SOLN: 3.0000 mL | Freq: Two times a day (BID) | INTRAMUSCULAR | Status: DC

## 2015-03-17 MED ORDER — SODIUM CHLORIDE 0.9 % WEIGHT BASED INFUSION: 3.0000 mL/kg/h | INTRAVENOUS | Status: DC

## 2015-03-17 MED ORDER — LIDOCAINE HCL (PF) 1 % IJ SOLN
INTRAMUSCULAR | Status: AC
  Filled 2015-03-17: qty 30

## 2015-03-17 MED ORDER — MORPHINE SULFATE 2 MG/ML IJ SOLN: 2.0000 mg | INTRAMUSCULAR | Status: DC | PRN

## 2015-03-17 MED ORDER — ONDANSETRON HCL 4 MG/2ML IJ SOLN: 4.0000 mg | Freq: Four times a day (QID) | INTRAMUSCULAR | Status: DC | PRN

## 2015-03-17 MED ORDER — NITROGLYCERIN 1 MG/10 ML FOR IR/CATH LAB
INTRA_ARTERIAL | Status: AC
  Filled 2015-03-17: qty 10

## 2015-03-17 MED ORDER — SODIUM CHLORIDE 0.9 % WEIGHT BASED INFUSION
3.0000 mL/kg/h | INTRAVENOUS | Status: DC
  Administered 2015-03-17: 3 mL/kg/h via INTRAVENOUS

## 2015-03-17 MED ORDER — HEPARIN SODIUM (PORCINE) 1000 UNIT/ML IJ SOLN
INTRAMUSCULAR | Status: DC | PRN
  Administered 2015-03-17: 5000 [IU] via INTRAVENOUS

## 2015-03-17 MED ORDER — ACETAMINOPHEN 325 MG PO TABS: 650.0000 mg | ORAL_TABLET | ORAL | Status: DC | PRN

## 2015-03-17 MED ORDER — SODIUM CHLORIDE 0.9 % IV SOLN: 250.0000 mL | INTRAVENOUS | Status: DC | PRN

## 2015-03-17 MED ORDER — IOHEXOL 350 MG/ML SOLN
INTRAVENOUS | Status: DC | PRN
  Administered 2015-03-17: 55 mL via INTRAVENOUS

## 2015-03-17 MED ORDER — HEPARIN SODIUM (PORCINE) 1000 UNIT/ML IJ SOLN
INTRAMUSCULAR | Status: AC
  Filled 2015-03-17: qty 1

## 2015-03-17 MED ORDER — VERAPAMIL HCL 2.5 MG/ML IV SOLN
INTRAVENOUS | Status: DC | PRN
  Administered 2015-03-17: 12:00:00 via INTRA_ARTERIAL

## 2015-03-17 SURGICAL SUPPLY — 12 items
CATH INFINITI 5FR ANG PIGTAIL (CATHETERS) ×2 IMPLANT
CATH OPTITORQUE TIG 4.0 5F (CATHETERS) ×2 IMPLANT
DEVICE RAD COMP TR BAND LRG (VASCULAR PRODUCTS) ×2 IMPLANT
GLIDESHEATH SLEND A-KIT 6F 22G (SHEATH) ×2 IMPLANT
KIT HEART LEFT (KITS) ×2 IMPLANT
PACK CARDIAC CATHETERIZATION (CUSTOM PROCEDURE TRAY) ×2 IMPLANT
SYR MEDRAD MARK V 150ML (SYRINGE) ×2 IMPLANT
TRANSDUCER W/STOPCOCK (MISCELLANEOUS) ×2 IMPLANT
TUBING CIL FLEX 10 FLL-RA (TUBING) ×2 IMPLANT
WIRE .035 3MM-J 145CM (WIRE) IMPLANT
WIRE HI TORQ VERSACORE-J 145CM (WIRE) ×2 IMPLANT
WIRE SAFE-T 1.5MM-J .035X260CM (WIRE) ×2 IMPLANT

## 2015-03-17 NOTE — Discharge Instructions (Signed)
Radial Site Care  NO METFORMIN FOR 2 DAYS   Refer to this sheet in the next few weeks. These instructions provide you with information on caring for yourself after your procedure. Your caregiver may also give you more specific instructions. Your treatment has been planned according to current medical practices, but problems sometimes occur. Call your caregiver if you have any problems or questions after your procedure. HOME CARE INSTRUCTIONS  You may shower the day after the procedure.Remove the bandage (dressing) and gently wash the site with plain soap and water.Gently pat the site dry.  Do not apply powder or lotion to the site.  Do not submerge the affected site in water for 3 to 5 days.  Inspect the site at least twice daily.  Do not flex or bend the affected arm for 24 hours.  No lifting over 5 pounds (2.3 kg) for 5 days after your procedure.  Do not drive home if you are discharged the same day of the procedure. Have someone else drive you.  You may drive 24 hours after the procedure unless otherwise instructed by your caregiver.  Do not operate machinery or power tools for 24 hours.  A responsible adult should be with you for the first 24 hours after you arrive home. What to expect:  Any bruising will usually fade within 1 to 2 weeks.  Blood that collects in the tissue (hematoma) may be painful to the touch. It should usually decrease in size and tenderness within 1 to 2 weeks. SEEK IMMEDIATE MEDICAL CARE IF:  You have unusual pain at the radial site.  You have redness, warmth, swelling, or pain at the radial site.  You have drainage (other than a small amount of blood on the dressing).  You have chills.  You have a fever or persistent symptoms for more than 72 hours.  You have a fever and your symptoms suddenly get worse.  Your arm becomes pale, cool, tingly, or numb.  You have heavy bleeding from the site. Hold pressure on the site. Document Released:  11/03/2010 Document Revised: 12/24/2011 Document Reviewed: 11/03/2010 [facility] Patient Information 2015 [place], [place]. This information is not intended to replace advice given to you by your health care provider. Make sure you discuss any questions you have with your health care provider.

## 2015-03-18 ENCOUNTER — Encounter (HOSPITAL_COMMUNITY): Payer: Self-pay | Admitting: Cardiovascular Disease

## 2015-03-18 MED FILL — Heparin Sodium (Porcine) 2 Unit/ML in Sodium Chloride 0.9%: INTRAMUSCULAR | Qty: 1000 | Status: AC

## 2015-03-24 ENCOUNTER — Encounter: Payer: Self-pay | Admitting: *Deleted

## 2015-03-28 ENCOUNTER — Encounter: Payer: Self-pay | Admitting: Nurse Practitioner

## 2015-03-28 ENCOUNTER — Ambulatory Visit (INDEPENDENT_AMBULATORY_CARE_PROVIDER_SITE_OTHER): Payer: Medicare Other | Admitting: Nurse Practitioner

## 2015-03-28 VITALS — BP 112/70 | HR 64 | Ht 71.0 in | Wt 236.6 lb

## 2015-03-28 DIAGNOSIS — I428 Other cardiomyopathies: Secondary | ICD-10-CM

## 2015-03-28 DIAGNOSIS — R9439 Abnormal result of other cardiovascular function study: Secondary | ICD-10-CM | POA: Diagnosis not present

## 2015-03-28 DIAGNOSIS — I429 Cardiomyopathy, unspecified: Secondary | ICD-10-CM

## 2015-03-28 DIAGNOSIS — I10 Essential (primary) hypertension: Secondary | ICD-10-CM | POA: Diagnosis not present

## 2015-03-28 DIAGNOSIS — Z9889 Other specified postprocedural states: Secondary | ICD-10-CM

## 2015-03-28 LAB — BASIC METABOLIC PANEL
BUN: 28 mg/dL — ABNORMAL HIGH (ref 6–23)
CO2: 30 mEq/L (ref 19–32)
Calcium: 9.5 mg/dL (ref 8.4–10.5)
Chloride: 94 mEq/L — ABNORMAL LOW (ref 96–112)
Creatinine, Ser: 1.22 mg/dL (ref 0.40–1.50)
GFR: 62.01 mL/min (ref >60.00)
Glucose, Bld: 270 mg/dL — ABNORMAL HIGH (ref 70–99)
Potassium: 4.8 mEq/L (ref 3.5–5.1)
Sodium: 133 mEq/L — ABNORMAL LOW (ref 135–145)

## 2015-03-28 MED ORDER — ASPIRIN EC 81 MG PO TBEC: 81.0000 mg | DELAYED_RELEASE_TABLET | Freq: Every day | ORAL | Status: DC

## 2015-03-28 MED ORDER — LISINOPRIL 20 MG PO TABS: 30.0000 mg | ORAL_TABLET | Freq: Every day | ORAL | Status: DC

## 2015-03-28 NOTE — Progress Notes (Signed)
CARDIOLOGY OFFICE NOTE  Date:  03/28/2015    Lucas Dudley Date of Birth: 1940/12/02 Medical Record #[id number]  PCP:  Robert Bellow, MD  Cardiologist:  Gwenlyn Found    Chief Complaint  Patient presents with  . Post cardiac cath visit    Seen for Dr. Gwenlyn Found    History of Present Illness: Lucas Dudley is a 74 y.o. male who presents today for a post cath visit. Seen for Dr. Gwenlyn Found. He has hypertension, hyperlipidemia, PAF - on Rythmol and Eliquis, as well as diabetes and remote tobacco abuse having quit 25 years ago.   He has developed dyspnea on exertion and some orthopnea several weeks ago which has ultimately improved with the addition of an oral diuretic. He does get occasional chest tightness. Recent Myoview stress test performed 02/09/15 showed inferior scar with moderate peri-infarct ischemia and a 2-D echo showed an EF of 40% which represents a decline from 50-55% by echo back in 2010. An event Monitor showed sick sinus syndrome/PAF with pauses up to 2.5 seconds.   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   Comes in today. Here with his wife and daughter joins [place] later for this visit. He is upset about being here in this office - wanting to know where Dr. Gwenlyn Found is. Wanting to know what "the plan" is for him. Says he thought he was seeing someone about his atrial fib. His breathing seems to be better. No chest pain. Not dizzy or lightheaded. Eats out most meals - probably gets too much salt.   Past Medical History  Diagnosis Date  . Myocardial infarct   . Diabetes mellitus   . Hypertension   . Glaucoma   . Hyperlipidemia   . Arrhythmia     paroxysmal afib on warfarin,rythmol  . OSA on CPAP 2012  . Chronic constipation   . Paroxysmal atrial fibrillation   . Abnormal nuclear stress test     Past Surgical History  Procedure Laterality Date  .  Cholecystectomy    . Hernia repair    . Shoulder surgery    . Nm myocar perf wall motion  02/21/2012    EF 61% ,EXERCISE 7 METS. exercise stopped due to wheezing and shortness of breathe  . Doppler echocardiography  05/25/2009    EF 50-55%,LA mildly dilated, LV function normal  . Carotid doppler  09/17/2008    rigt and left ICAs 0-49%;mildly  abnormal  . Cardiac catheterization N/A 03/17/2015    Procedure: Left Heart Cath and Coronary Angiography;  Surgeon: Lorretta Harp, MD;  Location: [facility];  Service: Cardiovascular;  Laterality: N/A;     Medications: Current Outpatient Prescriptions  Medication Sig Dispense Refill  . apixaban (ELIQUIS) 5 MG TABS tablet Take 1 tablet (5 mg total) by mouth 2 (two) times daily. 180 tablet 3  . docusate sodium (COLACE) 100 MG capsule Take 2 capsules (200 mg total) by mouth at bedtime. (Patient taking differently: Take 100 mg by mouth 2 (two) times daily. ) 10 capsule   . furosemide (LASIX) 20 MG tablet Take 1 tablet (20 mg total) by mouth daily as needed (for dyspnea or edema). (Patient taking differently: Take 20 mg by mouth daily. ) 30 tablet 3  . gemfibrozil (LOPID) 600 MG tablet Take 600 mg by mouth at bedtime.     [name]  glimepiride (AMARYL) 2 MG tablet Take 2 mg by mouth 2 (two) times daily.     [name] latanoprost (XALATAN) 0.005 % ophthalmic solution Place 1 drop into both eyes at bedtime.    [name] lisinopril (PRINIVIL,ZESTRIL) 20 MG tablet Take 1.5 tablets (30 mg total) by mouth daily. 135 tablet 3  . metFORMIN (GLUCOPHAGE) 500 MG tablet Take 1,000 mg by mouth 2 (two) times daily.    . metoprolol succinate (TOPROL XL) 25 MG 24 hr tablet Take 1 tablet (25 mg total) by mouth daily. 90 tablet 3  . Omega-3 Fatty Acids (FISH OIL) 1000 MG CAPS Take 1,000 mg by mouth daily.     . propafenone (RYTHMOL SR) 325 MG 12 hr capsule Take 325 mg by mouth 2 (two) times daily.    [name] aspirin EC 81 MG tablet Take 1 tablet (81 mg total) by mouth daily. 90 tablet 3    No current facility-administered medications for this visit.    Allergies: Allergies  Allergen Reactions  . Tape Other (See Comments) and Rash    Causes skin redness    Social History: The patient  reports that he quit smoking about 31 years ago. His smoking use included Cigarettes. He does not have any smokeless tobacco history on file. He reports that he does not drink alcohol or use illicit drugs.   Family History: The patient's family history includes Diabetes in his father; Healthy in his daughter; Heart attack (age of onset: 80) in his mother; Heart disease in his father and mother; Lung cancer in his mother; Stroke in his brother.   Review of Systems: Please see the history of present illness.   Otherwise, the review of systems is positive for none.   All other systems are reviewed and negative.   Physical Exam: VS:  BP 112/70 mmHg  Pulse 64  Ht 5\' 11"  (1.803 m)  Wt 236 lb 9.6 oz (107.321 kg)  BMI 33.01 kg/m2  SpO2 94% .  BMI Body mass index is 33.01 kg/(m^2).  Wt Readings from Last 3 Encounters:  03/28/15 236 lb 9.6 oz (107.321 kg)  03/17/15 243 lb (110.224 kg)  03/09/15 237 lb 8 oz (107.729 kg)    General: Pleasant. Well developed, well nourished and in no acute distress.  HEENT: Normal. Neck: Supple, no JVD, carotid bruits, or masses noted.  Cardiac: Irregular irregular today. No murmurs, rubs, or gallops. No edema.  Respiratory:  Lungs are clear to auscultation bilaterally with normal work of breathing.  GI: Soft and nontender.  MS: No deformity or atrophy. Gait and ROM intact. Skin: Warm and dry. Color is normal.  Neuro:  Strength and sensation are intact and no gross focal deficits noted.  Psych: Alert, appropriate and with normal affect.   LABORATORY DATA:  EKG:  EKG is not ordered today.  Lab Results  Component Value Date   WBC 7.2 03/11/2015   HGB 14.7 03/11/2015   HCT 42.9 03/11/2015   PLT 233 03/11/2015   GLUCOSE 201* 03/11/2015   NA 137  03/11/2015   K 4.3 03/11/2015   CL 98 03/11/2015   CREATININE 1.09 03/11/2015   BUN 26* 03/11/2015   CO2 28 03/11/2015   TSH 2.663 03/11/2015   INR 1.15 03/11/2015    BNP (last 3 results) No results for input(s): BNP in the last 8760 hours.  ProBNP (last 3 results) No results for input(s): PROBNP in the last 8760 hours.   Other Studies Reviewed Today:  Cardiac Cath Coronary Findings  Dominance: Right   Left Anterior Descending   . Prox LAD to Mid LAD lesion, 50% stenosed. The lesion is type non-C, tubular . The lesion was not previously treated. Pressure wire/FFR was not performed on the lesionIVUS was not performed on the lesion.   . First Diagonal Branch   . Ost 1st Diag lesion, 100% stenosed.     Right Coronary Artery   . Right Posterior Descending Artery   . RPDA lesion, 40% stenosed.     Left Ventricle The left ventricle is enlarged. There is severe left ventricular systolic dysfunction. The left ventricular ejection fraction is 25-35% by visual estimate. There are wall motion abnormalities in the left ventricle.  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 I think that his inferior perfusion abnormality was artifactual. I do not think his moderate proximal LAD lesion is hemostatically significant. 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 The patient received 5000 units of heparin intravenously. He received radial cocktail. The SideArm sheath. A total of 55 mL of contrast was used for the case. The sheath was removed and a TR band was placed on the right wrist to achieve patent hemostasis. The patient left the lab in stable condition. He'll be discharged home in 2 hours and will follow-up with me in the office in several weeks.  Quay Burow. MD, [facility] 03/17/2015 12:46 PM   Echo Study Conclusions from 01/2015  - Left ventricle: The cavity size was mildly dilated. Wall thickness was increased  in a pattern of mild LVH. The estimated ejection fraction was 40%. Diffuse hypokinesis. Indeterminant diastolic function (atrial fibrillation). - Aortic valve: There was no stenosis. - Mitral valve: There was trivial regurgitation. - Left atrium: The atrium was mildly dilated. - Right ventricle: The cavity size was normal. Systolic function was normal. - Pulmonary arteries: PA peak pressure: 15 mm Hg (S). - Inferior vena cava: The vessel was normal in size. The respirophasic diameter changes were in the normal range (= 50%), consistent with normal central venous pressure.  Impressions:  - The patient was in atrial fibrillation with RVR. Mildly dilated LV with mild LV hypertrophy. EF 40%, diffuse hypokinesis. Normal RV size and systolic function. No significant valvular abnormalities.  Event Monitor Conclusion     Paroxysmal atrial fib  The patient was in atrial fib for a significant amount of the monitored time. Many days he had atrial fib for almost the entire day.  He had several pauses of > 2 seconds.  No prolonged pauses that would cause syncope        Assessment/Plan: 1. S/P cardiac cath - non critical CAD - to manage medically. Cutting aspirin back to 81 mg.   2. NICM - needs titration of his HF medicines. Would plan on repeat echo in 3 months to recheck his EF - if remains </= to 40% - would refer on for ICD implant. Increasing ACE today. Needs to restrict his salt - this will be problematic due to eating out most meals. Check BMET today. May consider aldactone in the future.   3. HTN - BP looks good on current regimen. Hopefully he will tolerate increase in his ACE.   4. HLD  5. Systolic HF - EF of 62% by echo; 25 to 35% by cath - increasing ACE today.   6. PAF - he tells me he was to have an appointment in the AF clinic - I do not see that this has been  arranged and I do not see notation of this as well - nevertheless - his AF may be  contributing to his lower EF - will refer on today. His daughter has seen Dr. Lovena Le in the past. He remains on his Eliquis.   7. OSA - never treated - given his AF - this will compromise maintaining NSR - they will discuss with Dr. Gwenlyn Found - his last sleep study was 2 years ago per the patient.   Current medicines are reviewed with the patient today.  The patient does not have concerns regarding medicines other than what has been noted above.  The following changes have been made:  See above.  Labs/ tests ordered today include:    Orders Placed This Encounter  Procedures  . Basic metabolic panel     Disposition:   FU with Dr. Gwenlyn Found as planned. Plan as above.   Patient is agreeable to this plan and will call if any problems develop in the interim.   Signed: Burtis Junes, RN, ANP-C 03/28/2015 9:36 AM  [facility] [address] [place], [place]  [address] Phone: [phone] Fax: [phone]

## 2015-03-28 NOTE — Patient Instructions (Addendum)
We will be checking the following labs today - BMET   Medication Instructions:    Continue with your current medicines BUT  I am cutting the aspirin to 81 mg a day   I am increasing the Lisinopril to 30 mg a day - this is a pill and a half  - this has been sent to your mail order.     Testing/Procedures To Be Arranged:  N/A  Follow-Up:   See Dr. Gwenlyn Found in one month  Referring to the Atrial Fib [facility]/Dr. Allred - ? Friday pm  Call Dr. Gwenlyn Found about your sleep apnea - need to get your CPAP placed.     Other Special Instructions:   Salt restriction  Daily weights  Use extra Lasix if weight goes up 3 or more pounds overnight  Call the [facility] office at [phone] if you have any questions, problems or concerns.

## 2015-03-31 ENCOUNTER — Encounter (HOSPITAL_COMMUNITY): Payer: Self-pay | Admitting: Nurse Practitioner

## 2015-03-31 ENCOUNTER — Ambulatory Visit (HOSPITAL_COMMUNITY)
Admission: RE | Admit: 2015-03-31 | Discharge: 2015-03-31 | Disposition: A | Discharge status: Home / Self Care | Payer: Medicare Other | Source: Ambulatory Visit | Attending: Nurse Practitioner | Admitting: Nurse Practitioner

## 2015-03-31 VITALS — BP 130/82 | HR 90 | Ht 70.5 in | Wt 240.4 lb

## 2015-03-31 DIAGNOSIS — I4891 Unspecified atrial fibrillation: Secondary | ICD-10-CM | POA: Insufficient documentation

## 2015-03-31 DIAGNOSIS — I480 Paroxysmal atrial fibrillation: Secondary | ICD-10-CM

## 2015-03-31 DIAGNOSIS — I4510 Unspecified right bundle-branch block: Secondary | ICD-10-CM | POA: Insufficient documentation

## 2015-04-01 ENCOUNTER — Other Ambulatory Visit (HOSPITAL_COMMUNITY): Payer: Self-pay | Admitting: *Deleted

## 2015-04-01 ENCOUNTER — Encounter (HOSPITAL_COMMUNITY): Payer: Self-pay | Admitting: Nurse Practitioner

## 2015-04-01 NOTE — Progress Notes (Signed)
Patient ID: ISAIHA ASARE, male   DOB: 1941-03-06, 74 y.o.   MRN: [id number]     Primary Care Physician: Robert Bellow, MD Referring Physician: Dr. Gwenlyn Found Primary Cardiologist:Dr. Verlene Mayer is a 74 y.o. male  for evaluation in the afib clinic with a h/o CAD, NICM, EF 25-35% by recent LHC, DM, HTN,PAF x 4-5 years treated with propafenone, metoprolol and apixaban with a chadsvasc score of at least 5 . Recent holter monitor shows high afib burden with evidence for SSS with several pauses over 2 seconds but no prolonged pauses.Pt is in afib today but does not seem to be symptomatic with it. It is thought that pt may be benefit in the long run if he could maintain sinus rhythm for improvement in EF/CHF.   He denies alcohol use, no tobacco use,  has sleep apnea, not using cpap for intolerance of mask, does not exercise, obesity.  Today, he denies symptoms of palpitations, chest pain, shortness of breath, orthopnea, PND, lower extremity edema, dizziness, presyncope, syncope, or neurologic sequela. The patient is tolerating medications without difficulties and is otherwise without complaint today.   Past Medical History  Diagnosis Date  . Myocardial infarct   . Diabetes mellitus   . Hypertension   . Glaucoma   . Hyperlipidemia   . Arrhythmia     paroxysmal afib on warfarin,rythmol  . OSA on CPAP 2012  . Chronic constipation   . Paroxysmal atrial fibrillation   . Abnormal nuclear stress test    Past Surgical History  Procedure Laterality Date  . Cholecystectomy    . Hernia repair    . Shoulder surgery    . Nm myocar perf wall motion  02/21/2012    EF 61% ,EXERCISE 7 METS. exercise stopped due to wheezing and shortness of breathe  . Doppler echocardiography  05/25/2009    EF 50-55%,LA mildly dilated, LV function normal  . Carotid doppler  09/17/2008    rigt and left ICAs 0-49%;mildly  abnormal  . Cardiac catheterization N/A 03/17/2015    Procedure: Left Heart Cath and  Coronary Angiography;  Surgeon: Lorretta Harp, MD;  Location: [facility];  Service: Cardiovascular;  Laterality: N/A;    Current Outpatient Prescriptions  Medication Sig Dispense Refill  . apixaban (ELIQUIS) 5 MG TABS tablet Take 1 tablet (5 mg total) by mouth 2 (two) times daily. 180 tablet 3  . aspirin EC 325 MG tablet Take 325 mg by mouth daily.    [name] docusate sodium (COLACE) 100 MG capsule Take 2 capsules (200 mg total) by mouth at bedtime. (Patient taking differently: Take 100 mg by mouth 2 (two) times daily. ) 10 capsule   . furosemide (LASIX) 20 MG tablet Take 1 tablet (20 mg total) by mouth daily as needed (for dyspnea or edema). (Patient taking differently: Take 20 mg by mouth daily. ) 30 tablet 3  . gemfibrozil (LOPID) 600 MG tablet Take 600 mg by mouth at bedtime.     [name] glimepiride (AMARYL) 2 MG tablet Take 2 mg by mouth 2 (two) times daily.     [name] latanoprost (XALATAN) 0.005 % ophthalmic solution Place 1 drop into both eyes at bedtime.    [name] lisinopril (PRINIVIL,ZESTRIL) 20 MG tablet Take 1.5 tablets (30 mg total) by mouth daily. 135 tablet 3  . metFORMIN (GLUCOPHAGE) 500 MG tablet Take 1,000 mg by mouth 2 (two) times daily.    . metoprolol succinate (TOPROL XL) 25 MG 24 hr tablet  Take 1 tablet (25 mg total) by mouth daily. 90 tablet 3  . Omega-3 Fatty Acids (FISH OIL) 1000 MG CAPS Take 1,000 mg by mouth daily.     . propafenone (RYTHMOL SR) 325 MG 12 hr capsule Take 325 mg by mouth 2 (two) times daily.     No current facility-administered medications for this encounter.    Allergies  Allergen Reactions  . Tape Other (See Comments) and Rash    Causes skin redness    History   Social History  . Marital Status: Married    Spouse Name: N/A  . Number of Children: N/A  . Years of Education: N/A   Occupational History  . Not on file.   Social History Main Topics  . Smoking status: Former Smoker    Types: Cigarettes    Quit date: 10/31/1983  . Smokeless tobacco:  Not on file     Comment: Patient smoked for 35 years,smoked 1 ppk to 1 1/2 pack a day  . Alcohol Use: No  . Drug Use: No  . Sexual Activity: Not on file   Other Topics Concern  . Not on file   Social History Narrative    Family History  Problem Relation Age of Onset  . Heart disease Mother   . Lung cancer Mother   . Healthy Daughter   . Diabetes Father   . Heart disease Father   . Stroke Brother   . Heart attack Mother 87    ROS- All systems are reviewed and negative except as per the HPI above  Physical Exam: Filed Vitals:   03/31/15 1530  BP: 130/82  Pulse: 90  Height: 5' 10.5" (1.791 m)  Weight: 240 lb 6.4 oz (109.045 kg)    GEN- The patient is well appearing, alert and oriented x 3 today.   Head- normocephalic, atraumatic Eyes-  Sclera clear, conjunctiva pink Ears- hearing intact Oropharynx- clear Neck- supple, no JVP Lymph- no cervical lymphadenopathy Lungs- Clear to ausculation bilaterally, normal work of breathing Heart- Irregular rate and rhythm, no murmurs, rubs or gallops, PMI not laterally displaced GI- soft, NT, ND, + BS Extremities- no clubbing, cyanosis, or edema MS- no significant deformity or atrophy Skin- no rash or lesion Psych- euthymic mood, full affect Neuro- strength and sensation are intact  EKG- Afib with with LAD, IRBBB septal infarct, age undetermined, possible inferior infarct, age undetermined. QTS 108 ms, QTc 445 ms.  Echo-- Left ventricle: The cavity size was mildly dilated. Wall thickness was increased in a pattern of mild LVH. The estimated ejection fraction was 40%. Diffuse hypokinesis. Indeterminant diastolic function (atrial fibrillation). - Aortic valve: There was no stenosis. - Mitral valve: There was trivial regurgitation. - Left atrium: The atrium was mildly dilated. - Right ventricle: The cavity size was normal. Systolic function was normal. - Pulmonary arteries: PA peak pressure: 15 mm Hg (S). - Inferior  vena cava: The vessel was normal in size. The respirophasic diameter changes were in the normal range (= 50%), consistent with normal central venous pressure. Impressions: - The patient was in atrial fibrillation with RVR. Mildly dilated LV with mild LV hypertrophy. EF 40%, diffuse hypokinesis. Normal RV size and systolic function. No significant valvular abnormalities.  LHC-03/17/15 IMPRESSION:Mr. Elsass has noncritical CAD with severe LV dysfunction. I think that his inferior perfusion abnormality was artifactual. I do not think his moderate proximal LAD lesion is hemostatically significant. 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 The patient  received 5000 units of heparin intravenously. He received radial cocktail. The SideArm sheath. A total of 55 mL of contrast was used for the case. The sheath was removed and a TR band was placed on the right wrist to achieve patent hemostasis. The patient left the lab in stable condition. He'll be discharged home in 2 hours and will follow-up with me in the office in several weeks.  Cardiac monitor 03/02/15  Paroxysmal atrial fib  The patient was in atrial fib for a significant amount of the monitored time. Many days he had atrial fib for almost the entire day.  He had several pauses of > 2 seconds.  No prolonged pauses that would cause syncope   4d ago  3wk ago  7mo ago  37yr ago        Sodium 135 - 145 mEq/L 133 (L) 137 137R 135    Potassium 3.5 - 5.1 mEq/L 4.8 4.3R 4.7R 4.6    Chloride 96 - 112 mEq/L 94 (L) 98 95 (L)R 96    CO2 19 - 32 mEq/L 30 28 28R 29    Glucose, Bld 70 - 99 mg/dL 270 (H) 201 (H) 234 (H)R 104 (H)    BUN 6 - 23 mg/dL 28 (H) 26 (H) 37 (H)R 26 (H)    Creatinine, Ser 0.40 - 1.50 mg/dL 1.22 1.09R 1.35 (H)R 1.04R    Calcium 8.4 - 10.5 mg/dL 9.5 9.1 9.4R 9.3    GFR >60.00 mL/min 62.01              Assessment and Plan: 1. PAF Has failed propafenone and  this is not the most appropriate choice of antiarrythmic now with known CAD and LVD Will need an AAD that will not contribute to bradycardia with recent holter monitor showing early SSS with pauses.That would basically be tikosyn and this drug choice was discussed with pt as well as PVI. At this time, pt would like to pursue an appointment with Elberta Leatherwood, PharmD. He will need to stop propafenone to allow for wash out. He would like to arrange for hospitalization early next week because daughter will be leaving for vacation for two weeks on the 25th. Appointment has been made for 5/20 at 10:30 am.   2. HTN Stable  3. Sleep apnea Pt is not wearing cpap due to intolerance of mask. He was encouraged to revisit  Physician handling this.  4. Obesity Obtain weight loss

## 2015-04-04 ENCOUNTER — Ambulatory Visit (INDEPENDENT_AMBULATORY_CARE_PROVIDER_SITE_OTHER): Payer: PRIVATE HEALTH INSURANCE | Admitting: Pharmacist

## 2015-04-04 ENCOUNTER — Observation Stay (HOSPITAL_COMMUNITY)
Admission: AD | Admit: 2015-04-04 | Discharge: 2015-04-05 | DRG: 310 | Disposition: A | Discharge status: Home / Self Care | Payer: Medicare Other | Source: Ambulatory Visit | Attending: Cardiovascular Disease | Admitting: Cardiovascular Disease

## 2015-04-04 ENCOUNTER — Encounter (HOSPITAL_COMMUNITY): Payer: Self-pay | Admitting: General Practice

## 2015-04-04 DIAGNOSIS — I481 Persistent atrial fibrillation: Secondary | ICD-10-CM | POA: Diagnosis present

## 2015-04-04 DIAGNOSIS — G4733 Obstructive sleep apnea (adult) (pediatric): Secondary | ICD-10-CM | POA: Diagnosis not present

## 2015-04-04 DIAGNOSIS — I429 Cardiomyopathy, unspecified: Secondary | ICD-10-CM | POA: Diagnosis present

## 2015-04-04 DIAGNOSIS — I480 Paroxysmal atrial fibrillation: Principal | ICD-10-CM | POA: Diagnosis present

## 2015-04-04 DIAGNOSIS — Z79899 Other long term (current) drug therapy: Secondary | ICD-10-CM

## 2015-04-04 DIAGNOSIS — Z833 Family history of diabetes mellitus: Secondary | ICD-10-CM | POA: Diagnosis not present

## 2015-04-04 DIAGNOSIS — H409 Unspecified glaucoma: Secondary | ICD-10-CM | POA: Diagnosis present

## 2015-04-04 DIAGNOSIS — Z7982 Long term (current) use of aspirin: Secondary | ICD-10-CM

## 2015-04-04 DIAGNOSIS — I2510 Atherosclerotic heart disease of native coronary artery without angina pectoris: Secondary | ICD-10-CM | POA: Diagnosis present

## 2015-04-04 DIAGNOSIS — E119 Type 2 diabetes mellitus without complications: Secondary | ICD-10-CM | POA: Diagnosis not present

## 2015-04-04 DIAGNOSIS — Z8249 Family history of ischemic heart disease and other diseases of the circulatory system: Secondary | ICD-10-CM | POA: Diagnosis not present

## 2015-04-04 DIAGNOSIS — I252 Old myocardial infarction: Secondary | ICD-10-CM | POA: Diagnosis not present

## 2015-04-04 DIAGNOSIS — I10 Essential (primary) hypertension: Secondary | ICD-10-CM | POA: Diagnosis present

## 2015-04-04 DIAGNOSIS — Z7901 Long term (current) use of anticoagulants: Secondary | ICD-10-CM

## 2015-04-04 DIAGNOSIS — E785 Hyperlipidemia, unspecified: Secondary | ICD-10-CM | POA: Diagnosis present

## 2015-04-04 DIAGNOSIS — I4891 Unspecified atrial fibrillation: Secondary | ICD-10-CM | POA: Diagnosis not present

## 2015-04-04 DIAGNOSIS — I4819 Other persistent atrial fibrillation: Secondary | ICD-10-CM | POA: Diagnosis present

## 2015-04-04 HISTORY — DX: Other cardiomyopathies: I42.8

## 2015-04-04 HISTORY — DX: Atherosclerotic heart disease of native coronary artery without angina pectoris: I25.10

## 2015-04-04 LAB — MAGNESIUM
Magnesium: 1.6 mg/dL (ref 1.5–2.5)
Magnesium: 2 mg/dL (ref 1.7–2.4)

## 2015-04-04 LAB — BASIC METABOLIC PANEL
BUN: 26 mg/dL — ABNORMAL HIGH (ref 6–23)
CHLORIDE: 97 meq/L (ref 96–112)
CO2: 31 meq/L (ref 19–32)
CREATININE: 1.03 mg/dL (ref 0.40–1.50)
Calcium: 9.5 mg/dL (ref 8.4–10.5)
GFR: 75.39 mL/min (ref >60.00)
GLUCOSE: 205 mg/dL — AB (ref 70–99)
POTASSIUM: 4.4 meq/L (ref 3.5–5.1)
Sodium: 134 mEq/L — ABNORMAL LOW (ref 135–145)

## 2015-04-04 LAB — GLUCOSE, CAPILLARY
GLUCOSE-CAPILLARY: 198 mg/dL — AB (ref 65–99)
Glucose-Capillary: 190 mg/dL — ABNORMAL HIGH (ref 65–99)

## 2015-04-04 MED ORDER — METFORMIN HCL 500 MG PO TABS
1000.0000 mg | ORAL_TABLET | Freq: Two times a day (BID) | ORAL | Status: DC
  Administered 2015-04-04 – 2015-04-05 (×2): 1000 mg via ORAL
  Filled 2015-04-04 (×4): qty 2

## 2015-04-04 MED ORDER — INSULIN ASPART 100 UNIT/ML ~~LOC~~ SOLN
0.0000 [IU] | Freq: Three times a day (TID) | SUBCUTANEOUS | Status: DC
  Administered 2015-04-04: 2 [IU] via SUBCUTANEOUS
  Administered 2015-04-05: 5 [IU] via SUBCUTANEOUS

## 2015-04-04 MED ORDER — GLIMEPIRIDE 2 MG PO TABS
2.0000 mg | ORAL_TABLET | Freq: Two times a day (BID) | ORAL | Status: DC
  Administered 2015-04-04 – 2015-04-05 (×2): 2 mg via ORAL
  Filled 2015-04-04 (×4): qty 1

## 2015-04-04 MED ORDER — METOPROLOL SUCCINATE ER 25 MG PO TB24
25.0000 mg | ORAL_TABLET | Freq: Every day | ORAL | Status: DC
  Administered 2015-04-05: 25 mg via ORAL
  Filled 2015-04-04 (×2): qty 1

## 2015-04-04 MED ORDER — LATANOPROST 0.005 % OP SOLN
1.0000 [drp] | Freq: Every day | OPHTHALMIC | Status: DC
  Administered 2015-04-04: 1 [drp] via OPHTHALMIC
  Filled 2015-04-04: qty 2.5

## 2015-04-04 MED ORDER — ASPIRIN EC 81 MG PO TBEC
81.0000 mg | DELAYED_RELEASE_TABLET | Freq: Every day | ORAL | Status: DC
  Administered 2015-04-05: 81 mg via ORAL
  Filled 2015-04-04: qty 1

## 2015-04-04 MED ORDER — APIXABAN 5 MG PO TABS
5.0000 mg | ORAL_TABLET | Freq: Two times a day (BID) | ORAL | Status: DC
  Administered 2015-04-04 – 2015-04-05 (×2): 5 mg via ORAL
  Filled 2015-04-04 (×3): qty 1

## 2015-04-04 MED ORDER — GEMFIBROZIL 600 MG PO TABS
600.0000 mg | ORAL_TABLET | Freq: Every day | ORAL | Status: DC
  Administered 2015-04-04: 600 mg via ORAL
  Filled 2015-04-04 (×2): qty 1

## 2015-04-04 MED ORDER — LISINOPRIL 20 MG PO TABS
30.0000 mg | ORAL_TABLET | Freq: Every day | ORAL | Status: DC
  Administered 2015-04-05: 30 mg via ORAL
  Filled 2015-04-04: qty 1

## 2015-04-04 MED ORDER — DOFETILIDE 500 MCG PO CAPS: 500.0000 ug | ORAL_CAPSULE | Freq: Two times a day (BID) | ORAL | Status: DC

## 2015-04-04 MED ORDER — OMEGA-3-ACID ETHYL ESTERS 1 G PO CAPS
1.0000 g | ORAL_CAPSULE | Freq: Every day | ORAL | Status: DC
  Administered 2015-04-05: 1 g via ORAL
  Filled 2015-04-04: qty 1

## 2015-04-04 MED ORDER — INSULIN ASPART 100 UNIT/ML ~~LOC~~ SOLN: 0.0000 [IU] | Freq: Every day | SUBCUTANEOUS | Status: DC

## 2015-04-04 MED ORDER — SODIUM CHLORIDE 0.9 % IV SOLN: 250.0000 mL | INTRAVENOUS | Status: DC | PRN

## 2015-04-04 MED ORDER — SODIUM CHLORIDE 0.9 % IJ SOLN
3.0000 mL | Freq: Two times a day (BID) | INTRAMUSCULAR | Status: DC
  Administered 2015-04-04 (×2): 3 mL via INTRAVENOUS

## 2015-04-04 MED ORDER — MAGNESIUM SULFATE 2 GM/50ML IV SOLN
2.0000 g | Freq: Once | INTRAVENOUS | Status: AC
  Administered 2015-04-04: 2 g via INTRAVENOUS
  Filled 2015-04-04: qty 50

## 2015-04-04 MED ORDER — DOCUSATE SODIUM 100 MG PO CAPS
100.0000 mg | ORAL_CAPSULE | Freq: Two times a day (BID) | ORAL | Status: DC
  Administered 2015-04-04 – 2015-04-05 (×2): 100 mg via ORAL
  Filled 2015-04-04 (×4): qty 1

## 2015-04-04 MED ORDER — SODIUM CHLORIDE 0.9 % IJ SOLN: 3.0000 mL | INTRAMUSCULAR | Status: DC | PRN

## 2015-04-04 NOTE — Progress Notes (Signed)
Pharmacy Review for Dofetilide (Tikosyn) Initiation  Admit Complaint: 74 y.o. male admitted 04/04/2015 with atrial fibrillation to be initiated on dofetilide.   Assessment:  Patient Exclusion Criteria: If any screening criteria checked as "Yes", then  patient  should NOT receive dofetilide until criteria item is corrected. If "Yes" please indicate correction plan.  YES  NO Patient  Exclusion Criteria Correction Plan  []  [x]  Baseline QTc interval is greater than or equal to 440 msec. IF above YES box checked dofetilide contraindicated unless patient has ICD; then may proceed if QTc 500-550 msec or with known ventricular conduction abnormalities may proceed with QTc 550-600 msec. QTc = 397, per Dr. York Cerise review of EKG   [x]  []  Magnesium level is less than 1.8 mEq/l : Last magnesium:  Lab Results  Component Value Date   MG 1.6 04/04/2015       Patient is currently odered 2g of Mg with a plan to recheck Mg prior to tikosyn initiation  []  [x]  Potassium level is less than 4 mEq/l : Last potassium:  Lab Results  Component Value Date   K 4.4 04/04/2015         []  [x]  Patient is known or suspected to have a digoxin level greater than 2 ng/ml: No results found for: DIGOXIN    []  [x]  Creatinine clearance less than 20 ml/min (calculated using Cockcroft-Gault, actual body weight and serum creatinine): Estimated Creatinine Clearance: 80.2 mL/min (by C-G formula based on Cr of 1.03).    []  [x]  Patient has received drugs known to prolong the QT intervals within the last 48 hours (phenothiazines, tricyclics or tetracyclic antidepressants, erythromycin, H-1 antihistamines, cisapride, fluoroquinolones, azithromycin). Drugs not listed above may have an, as yet, undetected potential to prolong the QT interval, updated information on QT prolonging agents is available at this website:QT prolonging agents   []  [x]  Patient received a dose of hydrochlorothiazide (Oretic) alone or in any combination  including triamterene (Dyazide, Maxzide) in the last 48 hours.   []  [x]  Patient received a medication known to increase dofetilide plasma concentrations prior to initial dofetilide dose:  . Trimethoprim (Primsol, Proloprim) in the last 36 hours . Verapamil (Calan, Verelan) in the last 36 hours or a sustained release dose in the last 72 hours . Megestrol (Megace) in the last 5 days  . Cimetidine (Tagamet) in the last 6 hours . Ketoconazole (Nizoral) in the last 24 hours . Itraconazole (Sporanox) in the last 48 hours  . Prochlorperazine (Compazine) in the last 36 hours    []  [x]  Patient is known to have a history of torsades de pointes; congenital or acquired long QT syndromes.   []  [x]  Patient has received a Class 1 antiarrhythmic with less than 2 half-lives since last dose. (Disopyramide, Quinidine, Procainamide, Lidocaine, Mexiletine, Flecainide, Propafenone)  He took his last dose of propafenone in the AM on 6/17.   []  [x]  Patient has received amiodarone therapy in the past 3 months or amiodarone level is greater than 0.3 ng/ml.    Patient has been appropriately anticoagulated with Apixaban.  Ordering provider was confirmed at [email or web address] if they are not listed on the [facility] Prescribers list.  Goal of Therapy: Follow renal function, electrolytes, potential drug interactions, and dose adjustment. Provide education and 1 week supply at discharge Plan:  [x]   Physician selected initial dose within range recommended for patients level of renal function - will monitor for response.  []   Physician selected initial dose outside of range recommended for patients level  of renal function - will discuss if the dose should be altered at this time.   Select One Calculated CrCl  Dose q12h  [x]  > 60 ml/min 500 mcg  []  40-60 ml/min 250 mcg  []  20-40 ml/min 125 mcg   2. Follow up QTc after the first 5 doses, renal function, electrolytes (K & Mg) daily x 3     days, dose adjustment,  success of initiation and facilitate 1 week discharge supply as     clinically indicated.  3. Initiate Tikosyn education video (Call [phone] and ask for video # 116).  4. Place Enrollment Form on the chart for discharge supply of dofetilide.  Thank you for allowing pharmacy to be a part of this patients care team.  Rowe Robert Pharm.D., BCPS, AQ-Cardiology Clinical Pharmacist 04/04/2015 3:18 PM Pager: [phone] Phone: [phone]

## 2015-04-04 NOTE — Progress Notes (Signed)
Patient ID: Lucas Dudley, male   DOB: [birth year], 74 y.o.   MRN: [id number]     Primary Care Physician: Robert Bellow, MD Primary Cardiologist:Dr. Verlene Mayer is a 74 y.o. male seen today for Tikosyn initiation.  He has a h/o CAD, NICM, EF 25-35% by recent LHC, DM, HTN,PAF x 4-5 years treated with propafenone, metoprolol and apixaban with a chadsvasc score of at least 5 . He was evaluated in the afib clinic on 6/17 after wearing a monitor.  Recent holter monitor showed high afib burden with evidence for SSS with several pauses over 2 seconds but no prolonged pauses.  Pt does not seem to be symptomatic with his afib, but it is thought that pt may be benefit in the long run if he could maintain sinus rhythm for improvement in EF/CHF.  Given his history of CAD and cardiomyopathy, propafenone was stopped with plans to switch to Tikosyn given his lack of effect on HR compared to other class III agents.    Discussed potential side effects of Tikosyn with patient including QTc prolongation.  He is aware to call the office if he misses more than 2 doses in a row.  Reviewed pt's medication list.  He is currently not taking any QTc prolongating or contraindicated medications.  He took his last dose of propafenone in the AM on 6/17.  He is anticoagulated appropriately with Eliquis.    EKG reviewed by Dr. [name].  Atrial fibrillation with vent rate of 75 bpm. QTc 397 msec.   Past Medical History  Diagnosis Date  . Myocardial infarct   . Diabetes mellitus   . Hypertension   . Glaucoma   . Hyperlipidemia   . Arrhythmia     paroxysmal afib on warfarin,rythmol  . OSA on CPAP 2012  . Chronic constipation   . Paroxysmal atrial fibrillation   . Abnormal nuclear stress test    Past Surgical History  Procedure Laterality Date  . Cholecystectomy    . Hernia repair    . Shoulder surgery    . Nm myocar perf wall motion  02/21/2012    EF 61% ,EXERCISE 7 METS. exercise stopped due to wheezing  and shortness of breathe  . Doppler echocardiography  05/25/2009    EF 50-55%,LA mildly dilated, LV function normal  . Carotid doppler  09/17/2008    rigt and left ICAs 0-49%;mildly  abnormal  . Cardiac catheterization N/A 03/17/2015    Procedure: Left Heart Cath and Coronary Angiography;  Surgeon: Lorretta Harp, MD;  Location: [facility];  Service: Cardiovascular;  Laterality: N/A;    Current Outpatient Prescriptions  Medication Sig Dispense Refill  . apixaban (ELIQUIS) 5 MG TABS tablet Take 1 tablet (5 mg total) by mouth 2 (two) times daily. 180 tablet 3  . aspirin EC 325 MG tablet Take 325 mg by mouth daily.    [name] docusate sodium (COLACE) 100 MG capsule Take 2 capsules (200 mg total) by mouth at bedtime. (Patient taking differently: Take 100 mg by mouth 2 (two) times daily. ) 10 capsule   . furosemide (LASIX) 20 MG tablet Take 1 tablet (20 mg total) by mouth daily as needed (for dyspnea or edema). (Patient taking differently: Take 20 mg by mouth daily. ) 30 tablet 3  . gemfibrozil (LOPID) 600 MG tablet Take 600 mg by mouth at bedtime.     [name] glimepiride (AMARYL) 2 MG tablet Take 2 mg by mouth 2 (two) times daily.     [name]  latanoprost (XALATAN) 0.005 % ophthalmic solution Place 1 drop into both eyes at bedtime.    [name] lisinopril (PRINIVIL,ZESTRIL) 20 MG tablet Take 1.5 tablets (30 mg total) by mouth daily. 135 tablet 3  . metFORMIN (GLUCOPHAGE) 500 MG tablet Take 1,000 mg by mouth 2 (two) times daily.    . metoprolol succinate (TOPROL XL) 25 MG 24 hr tablet Take 1 tablet (25 mg total) by mouth daily. 90 tablet 3  . Omega-3 Fatty Acids (FISH OIL) 1000 MG CAPS Take 1,000 mg by mouth daily.      No current facility-administered medications for this visit.    Allergies  Allergen Reactions  . Tape Other (See Comments) and Rash    Causes skin redness    Assessment and Plan: 1. PAF Pt's QTc acceptable to start Tikosyn.  K good at 4.4 but Mg low at 1.6.  Discussed options with patient.   He is on a slight time crunch with his daughter going out of town next week.  Unsure that oral Mg would raise level enough overnight.  He is agreeable to go ahead and be admitted today and get IV mag this afternoon before starting Tikosyn.  He is aware to report to admitting.

## 2015-04-04 NOTE — Progress Notes (Signed)
Setup cpap and tried patient with a nasal mask; machine on for less than one minute and patient states he cannot wear it, he feels too uncomfortable. Encouraged pt to give it a few minutes and try to relax and adjust, pt refused and took the mask off. Encouraged to call if he wanted to try again at any point tonight. He communicates understanding.

## 2015-04-04 NOTE — H&P (Signed)
History and Physical   Patient ID: ADREAN HEITZ MRN: [id number], DOB/AGE: Nov 09, 1940 73 y.o. Date of Encounter: 04/04/2015  Primary Physician: Robert Bellow, MD Primary Cardiologist: Dr Gwenlyn Found  Chief Complaint:  Atrial fib  HPI: Lucas Dudley is a 74 y.o. male with a history of CAD, NICM, EF 25-35% by recent LHC, DM, HTN,PAF x 4-5 years treated with propafenone, metoprolol and apixaban with a chadsvasc score of at least 5.  Lucas Dudley has a poor activity level, but does not feel he has any recent change in his dyspnea on exertion. He denies palpitations and has not had shortness of breath, orthopnea, edema, presyncope or syncope. He wakes up in the night feeling that he needs to take a deep breath.   He had carries a diagnosis of sleep apnea but has not been able to tolerate the mask and so does use CPap. He has not had chest pain.   He was curious about his recent chest x-ray and was shown the images, in comparison with an old image showing that his elevated right hemidiaphragm has been there a long time. He wonders if this is secondary to previous abdominal surgeries.  Past Medical History  Diagnosis Date  . Myocardial infarct   . Hypertension   . Glaucoma   . Hyperlipidemia   . Arrhythmia     paroxysmal afib on warfarin,rythmol  . OSA on CPAP 2012  . Chronic constipation   . Paroxysmal atrial fibrillation   . Abnormal nuclear stress test   . Heart murmur   . Diabetes mellitus     TYPE 2  . Cancer     PROSTATE CANCER  . Chronic anticoagulation     Eliquis    Surgical History:  Past Surgical History  Procedure Laterality Date  . Cholecystectomy    . Hernia repair    . Shoulder surgery    . Nm myocar perf wall motion  02/21/2012    EF 61% ,EXERCISE 7 METS. exercise stopped due to wheezing and shortness of breathe  . Doppler echocardiography  05/25/2009    EF 50-55%,LA mildly dilated, LV function normal  . Carotid doppler  09/17/2008    rigt and left ICAs  0-49%;mildly  abnormal  . Cardiac catheterization N/A 03/17/2015    Procedure: Left Heart Cath and Coronary Angiography;  Surgeon: Lorretta Harp, MD;  Location: [facility];  Service: Cardiovascular;  Laterality: N/A;  . Prostatectomy       I have reviewed the patient's current medications. Medication Sig  apixaban (ELIQUIS) 5 MG TABS tablet Take 1 tablet (5 mg total) by mouth 2 (two) times daily.  aspirin EC 325 MG tablet Take 325 mg by mouth daily.  docusate sodium (COLACE) 100 MG capsule Take 2 capsules (200 mg total) by mouth at bedtime. Patient taking differently: Take 100 mg by mouth 2 (two) times daily.   furosemide (LASIX) 20 MG tablet Take 1 tablet (20 mg total) by mouth daily as needed (for dyspnea or edema). Patient taking differently: Take 20 mg by mouth daily.   gemfibrozil (LOPID) 600 MG tablet Take 600 mg by mouth at bedtime.   glimepiride (AMARYL) 2 MG tablet Take 2 mg by mouth 2 (two) times daily.   latanoprost (XALATAN) 0.005 % ophthalmic solution Place 1 drop into both eyes at bedtime.  lisinopril (PRINIVIL,ZESTRIL) 20 MG tablet Take 1.5 tablets (30 mg total) by mouth daily.  metFORMIN (GLUCOPHAGE) 500 MG tablet Take 1,000 mg by  mouth 2 (two) times daily.  metoprolol succinate (TOPROL XL) 25 MG 24 hr tablet Take 1 tablet (25 mg total) by mouth daily.  Omega-3 Fatty Acids (FISH OIL) 1000 MG CAPS Take 1,000 mg by mouth daily.    Allergies:  Allergies  Allergen Reactions  . Tape Other (See Comments) and Rash    Causes skin redness    History   Social History  . Marital Status: Married    Spouse Name: N/A  . Number of Children: N/A  . Years of Education: N/A   Occupational History  . Retired    Social History Main Topics  . Smoking status: Former Smoker    Types: Cigarettes    Quit date: 10/31/1983  . Smokeless tobacco: Never Used     Comment: Patient smoked for 35 years,smoked 1 ppk to 1 1/2 pack a day  . Alcohol Use: No  . Drug Use: No  .  Sexual Activity: Not on file   Other Topics Concern  . Not on file   Social History Narrative   Lives in [place], [place] with wife.    Family History  Problem Relation Age of Onset  . Heart disease Mother   . Lung cancer Mother   . Healthy Daughter   . Diabetes Father   . Heart disease Father   . Stroke Brother   . Heart attack Mother 45   Family Status  Relation Status Death Age  . Mother Deceased 40    MI  . Daughter Alive   . Sister Alive     Multiple health problems  . Brother Alive     Review of Systems:   Full 14-point review of systems otherwise negative except as noted above.  Physical Exam: Blood pressure 136/88, pulse 56, resp. rate 20, weight 236 lb 12.4 oz (107.4 kg), SpO2 97 %. General: Well developed, well nourished,male in no acute distress. Head: Normocephalic, atraumatic, sclera non-icteric, no xanthomas, nares are without discharge. Dentition: Poor Neck: No carotid bruits. JVD not elevated. No thyromegally Lungs: Good expansion bilaterally. without wheezes or rhonchi. Few dry rales Heart: IRRegular rate and rhythm with S1 S2.  No S3 or S4.  No murmur, no rubs, or gallops appreciated. Abdomen: Soft, non-tender, non-distended with normoactive bowel sounds. No hepatomegaly. No rebound/guarding. No obvious abdominal masses. Msk:  Strength and tone appear normal for age. No joint deformities or effusions, no spine or costo-vertebral angle tenderness. Extremities: No clubbing or cyanosis. No edema.  Distal pedal pulses are 2+ in 4 extrem Neuro: Alert and oriented X 3. Moves all extremities spontaneously. No focal deficits noted. Psych:  Responds to questions appropriately with a normal affect. Skin: No rashes or lesions noted. Chronic changes noted and some small areas of ecchymosis.  Labs:   Lab Results  Component Value Date   WBC 7.2 03/11/2015   HGB 14.7 03/11/2015   HCT 42.9 03/11/2015   MCV 86.1 03/11/2015   PLT 233 03/11/2015      Recent Labs Lab  04/04/15 1120  NA 134*  K 4.4  CL 97  CO2 31  BUN 26*  CREATININE 1.03  CALCIUM 9.5  GLUCOSE 205*   MAGNESIUM  Date Value Ref Range Status  04/04/2015 1.6 1.5 - 2.5 mg/dL Final    Cardiac Cath: 03/17/2015  Ost 1st Diag lesion, 100% stenosed.  Prox LAD to Mid LAD lesion, 50% stenosed.  RPDA lesion, 40% stenosed  The left ventricle is enlarged. There is severe left ventricular systolic dysfunction. The  left ventricular ejection fraction is 25-35% by visual estimate. There are wall motion abnormalities in the left ventricle IMPRESSION:Mr. Greenawalt has noncritical CAD with severe LV dysfunction. I think that his inferior perfusion abnormality was artifactual. I do not think his moderate proximal LAD lesion is hemostatically significant. 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  Echo: 02/09/2015 Study Conclusions - Left ventricle: The cavity size was mildly dilated. Wall thickness was increased in a pattern of mild LVH. The estimated ejection fraction was 40%. Diffuse hypokinesis. Indeterminant diastolic function (atrial fibrillation). - Aortic valve: There was no stenosis. - Mitral valve: There was trivial regurgitation. - Left atrium: The atrium was mildly dilated, 47 mm - Right ventricle: The cavity size was normal. Systolic function was normal. - Pulmonary arteries: PA peak pressure: 15 mm Hg (S). - Inferior vena cava: The vessel was normal in size. The respirophasic diameter changes were in the normal range (= 50%), consistent with normal central venous pressure. Impressions: - The patient was in atrial fibrillation with RVR. Mildly dilated LV with mild LV hypertrophy. EF 40%, diffuse hypokinesis. Normal RV size and systolic function. No significant valvular abnormalities.  ECG: 03/31/2015 Vent. rate 90 BPM PR interval * ms QRS duration 108 ms QT/QTc 364/445 ms (QTC 01/18/2015 was 446 ms) P-R-T axes *  -62 -10  ASSESSMENT AND PLAN:  Principal Problem:   Persistent atrial fibrillation - pt seen by D. Kayleen Memos, NP 06/17, seen by J. [name], PharmD, today - Mg is too low, supplement with 2 g IV and recheck this pm  - Consider DC CV   Otherwise, continue home meds plus SSI Active Problems:   Essential hypertension   Diabetes mellitus type 2, noninsulin dependent   Chronic anticoagulation - This patients CHA2DS2-VASc Score and unadjusted Ischemic Stroke Rate (% per year) is equal to 7.2 % stroke rate/year from a score of 5. Above score calculated as 1 point each if present [CHF, HTN, DM, Vascular=MI/PAD/Aortic Plaque, Age if 65-74, or Male], 2 points each if present [Age > 75, or Stroke/TIA/TE]   Lucas Dudley 04/04/2015 2:32 PM Beeper [phone]   The patient is unaware of his afib.  There is modest DOE but he notes no changes over months and this is different from before when he felt his atrial fibrillation presumably associated with a rapid rate. He also notes that he has never been cardioverted. ECG 9/15 was sinus.  His event recorder was concerning for pauses. 75% of them occurred during sleeping hours and the bulk of the rest of them occurred around his lunch time or his supper time. He apparently tends to fall asleep frequently during meals and watching television.  I don't think the pauses are of consequence out side of the need for driving him towards triggers for his sleep apnea.  I would recommend that we undertake cardioversion without drug support initially to make a determination as to whether the restoration of sinus rhythm no that he is well rate controlled is in fact associated with improvement in functional status.  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  I may be wrong about all of the above, but it seems that it is reasonable to pursue  cardioversion in the absence of drug first we will assess his symptoms thereafter to see if we can identify improved functional status.  We will  continue him on Ace inhibitors and beta blockers;  he would be reasonable to try him on Aldactone given his cardiomyopathy. He might also be a candidate for [employer].  In the event that his ejection fraction does not improve following restoration of sinus rhythm, I would recommend that we consider ICD implantation for primary prevention.  Also recommend he follow-up with a sleep physician as he is amenable to attempting sleep therapy as long as is not a full face mask

## 2015-04-05 ENCOUNTER — Encounter (HOSPITAL_COMMUNITY)
Admission: AD | Disposition: A | Discharge status: Home / Self Care | Payer: Self-pay | Source: Ambulatory Visit | Attending: Cardiovascular Disease

## 2015-04-05 ENCOUNTER — Encounter (HOSPITAL_COMMUNITY): Payer: Self-pay | Admitting: Physician Assistant

## 2015-04-05 DIAGNOSIS — I10 Essential (primary) hypertension: Secondary | ICD-10-CM

## 2015-04-05 DIAGNOSIS — I481 Persistent atrial fibrillation: Secondary | ICD-10-CM

## 2015-04-05 DIAGNOSIS — I480 Paroxysmal atrial fibrillation: Secondary | ICD-10-CM | POA: Diagnosis not present

## 2015-04-05 HISTORY — PX: ELECTROPHYSIOLOGIC STUDY: SHX172A

## 2015-04-05 LAB — GLUCOSE, CAPILLARY
GLUCOSE-CAPILLARY: 146 mg/dL — AB (ref 65–99)
Glucose-Capillary: 224 mg/dL — ABNORMAL HIGH (ref 65–99)
Glucose-Capillary: 133 mg/dL — ABNORMAL HIGH (ref 65–99)

## 2015-04-05 LAB — BASIC METABOLIC PANEL
ANION GAP: 9 (ref 5–15)
BUN: 23 mg/dL — ABNORMAL HIGH (ref 6–20)
CALCIUM: 8.9 mg/dL (ref 8.9–10.3)
CO2: 28 mmol/L (ref 22–32)
Chloride: 100 mmol/L — ABNORMAL LOW (ref 101–111)
Creatinine, Ser: 0.87 mg/dL (ref 0.61–1.24)
GLUCOSE: 214 mg/dL — AB (ref 65–99)
POTASSIUM: 4.7 mmol/L (ref 3.5–5.1)
Sodium: 137 mmol/L (ref 135–145)

## 2015-04-05 LAB — MAGNESIUM: Magnesium: 1.8 mg/dL (ref 1.7–2.4)

## 2015-04-05 SURGERY — CARDIOVERSION (CATH LAB)

## 2015-04-05 MED ORDER — MIDAZOLAM HCL 2 MG/2ML IJ SOLN
INTRAMUSCULAR | Status: AC
  Filled 2015-04-05: qty 2

## 2015-04-05 MED ORDER — SODIUM CHLORIDE 0.9 % IJ SOLN: 3.0000 mL | INTRAMUSCULAR | Status: DC | PRN

## 2015-04-05 MED ORDER — MAGNESIUM OXIDE 400 (241.3 MG) MG PO TABS
400.0000 mg | ORAL_TABLET | Freq: Every day | ORAL | Status: DC
  Administered 2015-04-05: 400 mg via ORAL
  Filled 2015-04-05: qty 1

## 2015-04-05 MED ORDER — DOCUSATE SODIUM 100 MG PO CAPS: 100.0000 mg | ORAL_CAPSULE | Freq: Two times a day (BID) | ORAL | Status: DC

## 2015-04-05 MED ORDER — SODIUM CHLORIDE 0.9 % IV SOLN: 250.0000 mL | INTRAVENOUS | Status: DC

## 2015-04-05 MED ORDER — SODIUM CHLORIDE 0.9 % IJ SOLN
3.0000 mL | Freq: Two times a day (BID) | INTRAMUSCULAR | Status: DC
  Administered 2015-04-05: 3 mL via INTRAVENOUS

## 2015-04-05 MED ORDER — MIDAZOLAM HCL 5 MG/5ML IJ SOLN
INTRAMUSCULAR | Status: DC | PRN
Start: 2015-04-05 — End: 2015-04-05
  Administered 2015-04-05 (×2): 2 mg via INTRAVENOUS

## 2015-04-05 MED ORDER — MAGNESIUM OXIDE 400 MG PO TABS: 400.0000 mg | ORAL_TABLET | Freq: Every day | ORAL | Status: DC

## 2015-04-05 MED ORDER — FENTANYL CITRATE (PF) 100 MCG/2ML IJ SOLN
INTRAMUSCULAR | Status: DC | PRN
  Administered 2015-04-05: 50 ug via INTRAVENOUS

## 2015-04-05 MED ORDER — FENTANYL CITRATE (PF) 100 MCG/2ML IJ SOLN
INTRAMUSCULAR | Status: AC
  Filled 2015-04-05: qty 2

## 2015-04-05 SURGICAL SUPPLY — 1 items: PAD DEFIB LIFELINK (PAD) ×2 IMPLANT

## 2015-04-05 NOTE — Progress Notes (Signed)
Report from cath lab.  Patient successfully cardioverted to normal sinus rhythm 78. BP=127/67.  Patient going to recovery area to be monitored. Payton Emerald, RN

## 2015-04-05 NOTE — Discharge Summary (Signed)
Discharge Summary   Patient ID: Lucas Dudley MRN: [id number], DOB/AGE: 11-15-1940 74 y.o. Admit date: 04/04/2015 D/C date:     04/05/2015  Primary Care Provider: Robert Bellow, MD Primary Cardiologist: Dr. Gwenlyn Found, seen by Dr. Caryl Comes this admission for EP  Primary Discharge Diagnoses:  1. Paroxysmal atrial fibrillation - s/p DCCV this admission 2. Borderline hypomagnesemia 3. NICM EF 25-35%  PMH:  Past Medical History  Diagnosis Date  . Myocardial infarct   . Hypertension   . Glaucoma   . Hyperlipidemia   . OSA on CPAP 2012  . Chronic constipation   . Paroxysmal atrial fibrillation   . Abnormal nuclear stress test   . Heart murmur   . Diabetes mellitus     TYPE 2  . Cancer     PROSTATE CANCER  . Chronic anticoagulation     Eliquis  . CAD (coronary artery disease)     a. Cath 03/17/15 showing 100% ostial D1, 50% prox LAD to mid LAD, 40% RPDA stenosis. Med rx.  [name] NICM (nonischemic cardiomyopathy)      Hospital Course: Lucas Dudley is a 74 y.o. male with a history of CAD, NICM, EF 25-35% by recent LHC, DM, HTN, PAF x 4-5 years, sleep apnea who presented to [facility] with atrial fibrillation.   Per history, he recently was noted to have decline in EF to 40% by echo which represented a decline from 50-55% by echo back in 2010. He underwent cath 03/17/15 showing 100% ostial D1, 50% prox LAD to mid LAD, 40% RPDA stenosis, EF 25-35%. Dr. Gwenlyn Found did not think his moderate prox LAD lesion was hemostatistically significant. He felt the patient had a NICM. He was evaluated in the afib clinic on 6/17 after wearing a monitor.An event Monitor showed sick sinus syndrome/PAF with pauses up to 2.5 seconds.Originally, given his history of CAD and cardiomyopathy, propafenone was stopped with plans to switch to Tikosyn given his lack of effect on HR compared to other class III agents.   He presented for admission and reported poor activity level, but did not feel he has any recent change  in his dyspnea on exertion. He denies palpitations and has not had shortness of breath, orthopnea, edema, presyncope or syncope. He wakes up in the night feeling that he needs to take a deep breath. He had carries a diagnosis of sleep apnea but has not been able to tolerate the mask. He has not had chest pain. He has been generally unaware of his atrial fibrillation. Dr. Caryl Comes reviewed the patient's chart. He noted that 75% of the pauses occurred during sleeping hours and the bulk of the rest of them occurred around his lunch time or his supper time. He apparently tends to fall asleep frequently during meals and watching television. Dr. Caryl Comes did not the pauses were of consequence outside of the need for driving him towards treatment for his sleep apnea. The patient reported he had never been cardioverted before. Dr. Caryl Comes recommended to undertake cardioversion without drug support initially to make a determination as to whether the restoration of sinus rhythm was associated with improvement in functional status. The patient did have periodic jumping of his HR to the 140s with activity, associated with palpitations; 70s-90s at rest. The decision was made to pursue DCCV which was successful today. The patient maintained NSR thereafter and felt better. Dr. Rayann Heman has seen and examined the patient today and feels he is stable for discharge. He recommended to continue home med  regimen, except discontinued the full dose aspirin he was on prior to admission since he is on Eliquis.   MagOx was added due to recent Mag levels that were low. The patient received 2g mag sulfate for a level of 1.6-2.0 on admission, and it was 1.8 today. He does not think his mail order will pay for his MagOx so they are aware they can pick this up OTC as long as they pay attention to the type of mag. I sent the rx in, just in case they will pay. Dr. Rayann Heman would like the patient to follow back up with Roderic Palau in 1 week and with Dr.  Caryl Comes in 4 weeks. I have sent a message to our [facility] office's scheduler requesting a follow-up appointment, and our office will call the patient with this information. Recommend recheck Mg level at f/u.  Discharge Vitals: Blood pressure 118/84, pulse 71, temperature 97.8 F (36.6 C), temperature source Oral, resp. rate 20, weight 236 lb 12.4 oz (107.4 kg), SpO2 94 %.  Labs: Lab Results  Component Value Date   WBC 7.2 03/11/2015   HGB 14.7 03/11/2015   HCT 42.9 03/11/2015   MCV 86.1 03/11/2015   PLT 233 03/11/2015    Recent Labs Lab 04/05/15 0352  NA 137  K 4.7  CL 100*  CO2 28  BUN 23*  CREATININE 0.87  CALCIUM 8.9  GLUCOSE 214*    Diagnostic Studies/Procedures   Dg Chest 2 View  03/15/2015   CLINICAL DATA:  Shortness of breath preop heart catheterization, former smoker  EXAM: CHEST  2 VIEW  COMPARISON:  12/29/2012  FINDINGS: Elevated right diaphragm similar to prior study. Moderate cardiac enlargement. Vascular pattern normal. No consolidation or effusion.  IMPRESSION: No active cardiopulmonary disease.   Electronically Signed   By: Skipper Cliche M.D.   On: 03/15/2015 17:47   DCCV. See report.  Discharge Medications   Current Discharge Medication List    START taking these medications   Details  magnesium oxide (MAG-OX) 400 MG tablet Take 1 tablet (400 mg total) by mouth daily. Qty: 30 tablet, Refills: 2      CONTINUE these medications which have CHANGED   Details  docusate sodium (COLACE) 100 MG capsule Take 1 capsule (100 mg total) by mouth 2 (two) times daily. This is how patient was taking at home.      CONTINUE these medications which have NOT CHANGED   Details  apixaban (ELIQUIS) 5 MG TABS tablet Take 1 tablet (5 mg total) by mouth 2 (two) times daily.     furosemide (LASIX) 20 MG tablet Take 1 tablet (20 mg total) by mouth daily as needed (for dyspnea or edema).     gemfibrozil (LOPID) 600 MG tablet Take 600 mg by mouth at bedtime.       glimepiride (AMARYL) 2 MG tablet Take 2 mg by mouth 2 (two) times daily.     latanoprost (XALATAN) 0.005 % ophthalmic solution Place 1 drop into both eyes at bedtime.    lisinopril (PRINIVIL,ZESTRIL) 20 MG tablet Take 1.5 tablets (30 mg total) by mouth daily.     metFORMIN (GLUCOPHAGE) 500 MG tablet Take 1,000 mg by mouth 2 (two) times daily.    metoprolol succinate (TOPROL XL) 25 MG 24 hr tablet Take 1 tablet (25 mg total) by mouth daily.     Omega-3 Fatty Acids (FISH OIL) 1000 MG CAPS Take 1,000 mg by mouth daily.       STOP taking these  medications     aspirin EC 325 MG tablet         Disposition   The patient will be discharged in stable condition to home. Discharge Instructions    Diet - low sodium heart healthy    Complete by:  As directed      Increase activity slowly    Complete by:  As directed   Stop taking aspirin. Start taking magnesium oxide (prescription).          Follow-up Information    Follow up with [facility].   Specialty:  Cardiology   Why:  Office will call you for your followup appointment. Call office if you have not heard back in 3 days.   Contact information:   [address], [place] [phone]        Duration of Discharge Encounter: Greater than 30 minutes including physician and PA time.  Raechel Ache PA-C 04/05/2015, 6:31 PM   Thompson Grayer MD, [facility] 04/05/2015 11:37 PM

## 2015-04-05 NOTE — Care Management Note (Signed)
Case Management Note  Patient Details  Name: Lucas Dudley MRN: [id number] Date of Birth: [birth year]  Subjective/Objective:   Pt admitted for persistent atrial fibrillation                 Action/Plan:  Pt is from home and is independent.  CM will continue to monitor for disposition needs.  Pt on Eliquis prior to admit   Expected Discharge Date:                  Expected Discharge Plan:  Home/Self Care  In-House Referral:     Discharge planning Services  CM Consult  Post Acute Care Choice:    Choice offered to:     DME Arranged:    DME Agency:     HH Arranged:    HH Agency:     Status of Service:  In process, will continue to follow  Medicare Important Message Given:  No Date Medicare IM Given:    Medicare IM give by:    Date Additional Medicare IM Given:    Additional Medicare Important Message give by:     If discussed at [place] of Stay Meetings, dates discussed:    Additional Comments:  Maryclare Labrador, RN 04/05/2015, 11:25 AM

## 2015-04-05 NOTE — Progress Notes (Signed)
Called to Trish to make aware that patient and family at bedside concerned about cardioversion.  Pt/wife were told it was scheduled for 13:30.  Pt states he is "starving".  Wannetta Sender said she will take care of this. Patient and family made aware. Pt resting with call bell within reach.  Will continue to monitor. Payton Emerald, RN

## 2015-04-05 NOTE — Progress Notes (Signed)
Patient Name: Lucas Dudley Date of Encounter: 04/05/2015   SUBJECTIVE  Denies chest pain, palpitation or SOB. Wife is afraid of cardioversion, I have given reassurance.   CURRENT MEDS . apixaban  5 mg Oral BID  . aspirin EC  81 mg Oral Daily  . docusate sodium  100 mg Oral BID  . gemfibrozil  600 mg Oral QHS  . glimepiride  2 mg Oral BID WC  . insulin aspart  0-15 Units Subcutaneous TID WC  . insulin aspart  0-5 Units Subcutaneous QHS  . latanoprost  1 drop Both Eyes QHS  . lisinopril  30 mg Oral Daily  . metFORMIN  1,000 mg Oral BID WC  . metoprolol succinate  25 mg Oral Q breakfast  . omega-3 acid ethyl esters  1 g Oral Daily  . sodium chloride  3 mL Intravenous Q12H    OBJECTIVE  Filed Vitals:   04/04/15 1351 04/04/15 1943 04/05/15 0359  BP: 136/88 124/97 125/61  Pulse: 56 78 49  Temp:  97.8 F (36.6 C) 97.8 F (36.6 C)  TempSrc:  Oral Oral  Resp: 20 18 18   Weight: 236 lb 12.4 oz (107.4 kg)    SpO2: 97% 93% 91%    Intake/Output Summary (Last 24 hours) at 04/05/15 0752 Last data filed at 04/04/15 1806  Gross per 24 hour  Intake    240 ml  Output    375 ml  Net   -135 ml   Filed Weights   04/04/15 1351  Weight: 236 lb 12.4 oz (107.4 kg)    PHYSICAL EXAM  General: Pleasant, NAD. Neuro: Alert and oriented X 3. Moves all extremities spontaneously. Psych: Normal affect. HEENT:  Normal  Neck: Supple without bruits or JVD. Lungs:  Resp regular and unlabored, CTA. Heart:irregularly rate and rhythm. no s3, s4, or murmurs. Abdomen: Soft, non-tender, non-distended, BS + x 4.  Extremities: No clubbing, cyanosis or edema. DP/PT/Radials 2+ and equal bilaterally.  Accessory Clinical Findings  CBC Basic Metabolic Panel  Recent Labs  04/04/15 1120 04/04/15 2008 04/05/15 0352  NA 134*  --  137  K 4.4  --  4.7  CL 97  --  100*  CO2 31  --  28  GLUCOSE 205*  --  214*  BUN 26*  --  23*  CREATININE 1.03  --  0.87  CALCIUM 9.5  --  8.9  MG 1.6 2.0 1.8     TELE  Afib at rate of 70s-100s. Occasional spike to 130s. Few missed beats, longest 2.25sec.   Radiology/Studies  Dg Chest 2 View  03/15/2015   CLINICAL DATA:  Shortness of breath preop heart catheterization, former smoker  EXAM: CHEST  2 VIEW  COMPARISON:  12/29/2012  FINDINGS: Elevated right diaphragm similar to prior study. Moderate cardiac enlargement. Vascular pattern normal. No consolidation or effusion.  IMPRESSION: No active cardiopulmonary disease.   Electronically Signed   By: Skipper Cliche M.D.   On: 03/15/2015 17:47    ASSESSMENT AND PLAN Principal Problem:   Persistent atrial fibrillation : CHA2DS2-VASc score is 5. Plan for cardioversion today. Rate jumps to 130-140s with activity. Started on Toprol xl 25mg  early morning around 5mg . At time rate still in 120s with activity. Rate in 70s-90s with rest. I have advised him to rest. Consider increasing to 50mg  if rate is still high. Continue ASA, eliquis, lisinopril.    Active Problems:   Essential hypertension   Diabetes mellitus type 2, noninsulin dependent   Chronic  anticoagulation      Signed, Bhagat,Bhavinkumar PA-C Pager [phone]  I have seen, examined the patient, and reviewed the above assessment and plan.  Changes to above are made where necessary.  Proceed with cardioversion later today.  Further management pending results of cardioversion.  Co Sign: Thompson Grayer, MD 04/05/2015 8:26 AM

## 2015-04-05 NOTE — Progress Notes (Addendum)
MD paged regarding pt HR intermittently jumping into 140's with activity. Orders given to administer metroprolol early. Pt states he feels his heart is racing. Pt educated and instructed to stay in bed.  Will continue to monitor.  Raliegh Ip RN

## 2015-04-05 NOTE — Progress Notes (Signed)
Loleta Dicker in cath lab to get update on patient coming for cardioversion.  Gaspar Bidding stated patient not on schedule but would find out and call back. Called Trish to ask that someone come and speak to family.  Family upset that patient was not cardioverted.  Pt resting with call bell within reach.  Will continue to monitor.

## 2015-04-05 NOTE — Progress Notes (Signed)
Pt discharged home with family per W/C. IV removed. Skin clean, dry and intact. Discharge instructions reviewed and pt verbalized understanding.   Raliegh Ip RN

## 2015-04-05 NOTE — Op Note (Signed)
   Successful DC cardioversion  Procedure: Electrical Cardioversion Indications: Atrial Fibrillation  Procedure Details:  Consent: Risks of procedure as well as the alternatives and risks of each were explained to the (patient/caregiver). Consent for procedure obtained.  Time Out: Verified patient identification, verified procedure, site/side was marked, verified correct patient position, special equipment/implants available, medications/allergies/relevent history reviewed, required imaging and test results available. Performed  Patient placed on cardiac monitor, pulse oximetry, supplemental oxygen as necessary.  Sedation given: Versed 4 mg and Fentanyl 50 mcg IV Pacer pads placed anterior and posterior chest.  Cardioverted 1 time(s).  Cardioversion with synchronized biphasic 150J shock.  Evaluation: Findings: Post procedure EKG shows: NSR Complications: None Patient did tolerate procedure well.  Time Spent Directly with the Patient:  60 minutes   Maliah Pyles 04/05/2015, 5:17 PM

## 2015-04-06 ENCOUNTER — Encounter (HOSPITAL_COMMUNITY): Payer: Self-pay | Admitting: Cardiovascular Disease

## 2015-04-06 ENCOUNTER — Encounter (HOSPITAL_COMMUNITY): Payer: Self-pay

## 2015-04-06 ENCOUNTER — Ambulatory Visit (HOSPITAL_COMMUNITY): Admit: 2015-04-06 | Payer: Self-pay | Admitting: Cardiology

## 2015-04-06 ENCOUNTER — Telehealth: Payer: Self-pay | Admitting: Cardiovascular Disease

## 2015-04-06 SURGERY — CARDIOVERSION
Anesthesia: Monitor Anesthesia Care

## 2015-04-06 NOTE — Telephone Encounter (Signed)
SPOKE TO PATIENT PATIENT AWARE OF APPOINTMENT WITH DR BERRY  Patient contacted regarding discharge from [facility] on 6/21//16.  Patient understands to follow up with provider -- [place] Patient understands discharge instructions? yes Patient understands medications and regiment? yes Patient understands to bring all medications to this visit? yes    PATIENT HAD QUESTION IF METOPROLOL NEED TO BE CONTINUE FROM HOSPITALIZATION. PER DISCHARGE SUMMARY - YES CONTINUE TAKING METOPROLOL  PER DISCHARGE SUMMARY FROM 04/05/15 - PATIENT NEEDS AN APPOINTMENT WITH DONNA CARROLL IN 1 WEEK AND DR Caryl Comes IN 4 WEEKS FORWARD TO SCHEDULER PATIENT WILL NEED ANOTHER TCM CALL

## 2015-04-06 NOTE — Telephone Encounter (Signed)
Pt needs a post hospital f/u call He will be following up with Dr. Gwenlyn Found on 7/27 at 3:00pm   Thanks

## 2015-04-08 ENCOUNTER — Encounter: Payer: Self-pay | Admitting: *Deleted

## 2015-04-13 ENCOUNTER — Encounter (HOSPITAL_COMMUNITY): Payer: Self-pay | Admitting: Nurse Practitioner

## 2015-04-13 ENCOUNTER — Ambulatory Visit (HOSPITAL_COMMUNITY)
Admission: RE | Admit: 2015-04-13 | Discharge: 2015-04-13 | Disposition: A | Discharge status: Home / Self Care | Payer: BLUE CROSS/BLUE SHIELD | Source: Ambulatory Visit | Attending: Nurse Practitioner | Admitting: Nurse Practitioner

## 2015-04-13 ENCOUNTER — Telehealth: Payer: Self-pay | Admitting: Cardiovascular Disease

## 2015-04-13 VITALS — BP 128/82 | HR 85 | Ht 70.5 in | Wt 239.6 lb

## 2015-04-13 DIAGNOSIS — I2510 Atherosclerotic heart disease of native coronary artery without angina pectoris: Secondary | ICD-10-CM | POA: Diagnosis not present

## 2015-04-13 DIAGNOSIS — I481 Persistent atrial fibrillation: Secondary | ICD-10-CM | POA: Insufficient documentation

## 2015-04-13 DIAGNOSIS — I4819 Other persistent atrial fibrillation: Secondary | ICD-10-CM

## 2015-04-13 DIAGNOSIS — I480 Paroxysmal atrial fibrillation: Secondary | ICD-10-CM

## 2015-04-13 DIAGNOSIS — G4730 Sleep apnea, unspecified: Secondary | ICD-10-CM | POA: Diagnosis not present

## 2015-04-13 DIAGNOSIS — I10 Essential (primary) hypertension: Secondary | ICD-10-CM | POA: Diagnosis not present

## 2015-04-13 NOTE — Progress Notes (Signed)
Patient ID: Lucas Dudley, male   DOB: 1941/05/06, 74 y.o.   MRN: [id number]      Primary Care Physician: Robert Bellow, MD Referring Physician: Dr. Gwenlyn Found Primary Cardiologist:Dr. Verlene Mayer is a 74 y.o. male  for evaluation in the afib clinic with a h/o CAD, NICM, EF 25-35% by recent LHC, DM, HTN,PAF x 4-5 years treated with propafenone, metoprolol and apixaban with a chadsvasc score of at least 5 . Recent holter monitor shows high afib burden with evidence for SSS with several pauses over 2 seconds but no prolonged pauses.Pt is in afib today but does not seem to be symptomatic with it. It is thought that pt may be benefit in the long run if he could maintain sinus rhythm for improvement in EF/CHF. He denies alcohol use, no tobacco use,  has sleep apnea, not using cpap for intolerance of mask, does not exercise, obesity.  Pt was admitted to the hospital recently for tikosyn load but it was thought that pt should be cardioverted  only to see if he felt better in SR. Pt did have a successful cardioversion but is back in afib today, rate controlled. He can not tell me when he returned to afib. He is off propafenone and just rate controlled at this point. ASA was stopped due to concerns with bleeding,  pt resumed ASA due to having chest pain when he is off ASA. States he has been taking ASA x 40 years and has not bothered his stomach. He does have sleep apnea, does not wear cpap, has an appointment to further discuss with Dr. Claiborne Billings 7/11. Is pending an appointment with Dr. Caryl Comes to further discuss LVD and persistent afib.  Today, he denies symptoms of palpitations, chest pain,  mild shortness of breath at times, orthopnea, PND, lower extremity edema, dizziness, presyncope, syncope, or neurologic sequela. The patient is tolerating medications without difficulties and is otherwise without complaint today.   Past Medical History  Diagnosis Date  . Myocardial infarct   . Hypertension   .  Glaucoma   . Hyperlipidemia   . OSA on CPAP 2012  . Chronic constipation   . Paroxysmal atrial fibrillation   . Abnormal nuclear stress test   . Heart murmur   . Diabetes mellitus     TYPE 2  . Cancer     PROSTATE CANCER  . Chronic anticoagulation     Eliquis  . CAD (coronary artery disease)     a. Cath 03/17/15 showing 100% ostial D1, 50% prox LAD to mid LAD, 40% RPDA stenosis. Med rx.  [name] NICM (nonischemic cardiomyopathy)    Past Surgical History  Procedure Laterality Date  . Cholecystectomy    . Hernia repair    . Shoulder surgery    . Nm myocar perf wall motion  02/21/2012    EF 61% ,EXERCISE 7 METS. exercise stopped due to wheezing and shortness of breathe  . Doppler echocardiography  05/25/2009    EF 50-55%,LA mildly dilated, LV function normal  . Carotid doppler  09/17/2008    rigt and left ICAs 0-49%;mildly  abnormal  . Cardiac catheterization N/A 03/17/2015    Procedure: Left Heart Cath and Coronary Angiography;  Surgeon: Lorretta Harp, MD;  Location: [facility];  Service: Cardiovascular;  Laterality: N/A;  . Prostatectomy    . Electrophysiologic study N/A 04/05/2015    Procedure: Cardioversion;  Surgeon: Sanda Klein, MD;  Location: [facility];  Service: Cardiovascular;  Laterality: N/A;    Current Outpatient Prescriptions  Medication Sig Dispense Refill  . apixaban (ELIQUIS) 5 MG TABS tablet Take 1 tablet (5 mg total) by mouth 2 (two) times daily. 180 tablet 3  . aspirin EC 325 MG tablet Take 325 mg by mouth daily.    [name] docusate sodium (COLACE) 100 MG capsule Take 1 capsule (100 mg total) by mouth 2 (two) times daily.    . furosemide (LASIX) 20 MG tablet Take 1 tablet (20 mg total) by mouth daily as needed (for dyspnea or edema). (Patient taking differently: Take 20 mg by mouth daily. ) 30 tablet 3  . gemfibrozil (LOPID) 600 MG tablet Take 600 mg by mouth at bedtime.     [name] glimepiride (AMARYL) 2 MG tablet Take 2 mg by mouth 2 (two) times daily.       [name] latanoprost (XALATAN) 0.005 % ophthalmic solution Place 1 drop into both eyes at bedtime.    [name] lisinopril (PRINIVIL,ZESTRIL) 20 MG tablet Take 1.5 tablets (30 mg total) by mouth daily. 135 tablet 3  . magnesium oxide (MAG-OX) 400 MG tablet Take 1 tablet (400 mg total) by mouth daily. 30 tablet 2  . metFORMIN (GLUCOPHAGE) 500 MG tablet Take 1,000 mg by mouth 2 (two) times daily.    . metoprolol succinate (TOPROL XL) 25 MG 24 hr tablet Take 1 tablet (25 mg total) by mouth daily. 90 tablet 3  . Omega-3 Fatty Acids (FISH OIL) 1000 MG CAPS Take 1,000 mg by mouth daily.      No current facility-administered medications for this encounter.    Allergies  Allergen Reactions  . Tape Other (See Comments) and Rash    Causes skin redness    History   Social History  . Marital Status: Married    Spouse Name: N/A  . Number of Children: N/A  . Years of Education: N/A   Occupational History  . Retired    Social History Main Topics  . Smoking status: Former Smoker    Types: Cigarettes    Quit date: 10/31/1983  . Smokeless tobacco: Never Used     Comment: Patient smoked for 35 years,smoked 1 ppk to 1 1/2 pack a day  . Alcohol Use: No  . Drug Use: No  . Sexual Activity: Not on file   Other Topics Concern  . Not on file   Social History Narrative   Lives in [place], [place] with wife.    Family History  Problem Relation Age of Onset  . Heart disease Mother   . Lung cancer Mother   . Healthy Daughter   . Diabetes Father   . Heart disease Father   . Stroke Brother   . Heart attack Mother 103    ROS- All systems are reviewed and negative except as per the HPI above  Physical Exam: Filed Vitals:   04/13/15 1342  BP: 128/82  Pulse: 85  Height: 5' 10.5" (1.791 m)  Weight: 239 lb 9.6 oz (108.682 kg)    GEN- The patient is well appearing, alert and oriented x 3 today.   Head- normocephalic, atraumatic Eyes-  Sclera clear, conjunctiva pink Ears- hearing intact Oropharynx-  clear Neck- supple, no JVP Lymph- no cervical lymphadenopathy Lungs- Clear to ausculation bilaterally, normal work of breathing Heart- Irregular rate and rhythm, no murmurs, rubs or gallops, PMI not laterally displaced GI- soft, NT, ND, + BS Extremities- no clubbing, cyanosis, or edema MS- no significant deformity or atrophy Skin- no rash or lesion  Psych- euthymic mood, full affect Neuro- strength and sensation are intact  EKG- Afib with with LAD, IRBBB septal infarct, age undetermined, QRS 90 ms, QTc 435 ms.  Echo-- Left ventricle: The cavity size was mildly dilated. Wall thickness was increased in a pattern of mild LVH. The estimated ejection fraction was 40%. Diffuse hypokinesis. Indeterminant diastolic function (atrial fibrillation). - Aortic valve: There was no stenosis. - Mitral valve: There was trivial regurgitation. - Left atrium: The atrium was mildly dilated. - Right ventricle: The cavity size was normal. Systolic function was normal. - Pulmonary arteries: PA peak pressure: 15 mm Hg (S). - Inferior vena cava: The vessel was normal in size. The respirophasic diameter changes were in the normal range (= 50%), consistent with normal central venous pressure. Impressions: - The patient was in atrial fibrillation with RVR. Mildly dilated LV with mild LV hypertrophy. EF 40%, diffuse hypokinesis. Normal RV size and systolic function. No significant valvular abnormalities.  LHC-03/17/15 IMPRESSION:Mr. Desa has noncritical CAD with severe LV dysfunction. I think that his inferior perfusion abnormality was artifactual. I do not think his moderate proximal LAD lesion is hemostatically significant. 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 The patient received 5000 units of heparin intravenously. He received radial cocktail. The SideArm sheath. A total of 55 mL of contrast was used for the case. The sheath was removed and a  TR band was placed on the right wrist to achieve patent hemostasis. The patient left the lab in stable condition. He'll be discharged home in 2 hours and will follow-up with me in the office in several weeks.  Cardiac monitor 03/02/15  Paroxysmal atrial fib  The patient was in atrial fib for a significant amount of the monitored time. Many days he had atrial fib for almost the entire day.  He had several pauses of > 2 seconds.  No prolonged pauses that would cause syncope     Assessment and Plan: 1. PAF Has failed propafenone and recent cardioversion with ERAF. F/u with Dr. Caryl Comes in 3 weeks as previously scheduled to further evaluate.  2. HTN Stable  3. Sleep apnea Pt is not wearing cpap due to intolerance of mask. He was encouraged to keep appointment with Dr. Claiborne Billings to further discuss management   4. CAD Stable  5. LVD 40 % by echo, severe LV dysfunction by LHC Can be further evaluated by Dr. Caryl Comes  F/u with Dr. Gwenlyn Found as scheduled 7/27 and afib clinic as needed

## 2015-04-13 NOTE — Patient Instructions (Signed)
Dr. Caryl Comes scheduler will call you with appt.

## 2015-04-13 NOTE — Telephone Encounter (Signed)
Records for appointment on 04/25/15 with Dr Claiborne Billings.  Records given to [facility] (medical records) for Dr Evette Georges schedule on 04/25/15.

## 2015-04-22 ENCOUNTER — Ambulatory Visit (INDEPENDENT_AMBULATORY_CARE_PROVIDER_SITE_OTHER): Payer: Medicare Other | Admitting: Internal Medicine

## 2015-04-22 ENCOUNTER — Encounter: Payer: Self-pay | Admitting: Internal Medicine

## 2015-04-22 VITALS — BP 104/70 | HR 63 | Ht 70.0 in | Wt 238.8 lb

## 2015-04-22 DIAGNOSIS — I481 Persistent atrial fibrillation: Secondary | ICD-10-CM | POA: Diagnosis not present

## 2015-04-22 DIAGNOSIS — I4819 Other persistent atrial fibrillation: Secondary | ICD-10-CM

## 2015-04-22 MED ORDER — LISINOPRIL 20 MG PO TABS: 20.0000 mg | ORAL_TABLET | Freq: Every day | ORAL | Status: DC

## 2015-04-22 NOTE — Progress Notes (Signed)
ELECTROPHYSIOLOGY Progress NOTE  Patient ID: Lucas Dudley, MRN: [id number], DOB/AGE: 1941-09-11 74 y.o. Admit date: (Not on file) Date of Consult: 04/22/2015  Primary Physician: Robert Bellow, MD Primary Cardiologist: [facility]  Chief Complaint: Afib   HPI Lucas Dudley is a 74 y.o. male  Seen following a hospital evaluation for atrial fibrillation at which time he been admitted for Tikosyn initiation. There were no clearly attributable symptoms and was elected to undertake drug-free cardioversion. He reverted shortly thereafter to atrial fibrillation. He has no palpitations. However, looking back over the last year, both he and his wife make the observation that he has less energy. He also has been sleeping in a chair because of orthopnea. He has not had peripheral edema.   Catheterization 6/16 demonstrated nonobstructive coronary disease apart from a single lesion in D1. Ejection fraction 25-35%. Myoview demonstrated ejection fraction of 40% some months earlier. Ejection fraction 2013 had been 55-60%   Left atrial dimension 47/1.99/31.7  Past Medical History  Diagnosis Date  . Myocardial infarct   . Hypertension   . Glaucoma   . Hyperlipidemia   . OSA on CPAP 2012  . Chronic constipation   . Paroxysmal atrial fibrillation   . Abnormal nuclear stress test   . Heart murmur   . Diabetes mellitus     TYPE 2  . Cancer     PROSTATE CANCER  . Chronic anticoagulation     Eliquis  . CAD (coronary artery disease)     a. Cath 03/17/15 showing 100% ostial D1, 50% prox LAD to mid LAD, 40% RPDA stenosis. Med rx.  [name] NICM (nonischemic cardiomyopathy)       Surgical History:  Past Surgical History  Procedure Laterality Date  . Cholecystectomy    . Hernia repair    . Shoulder surgery    . Nm myocar perf wall motion  02/21/2012    EF 61% ,EXERCISE 7 METS. exercise stopped due to wheezing and shortness of breathe  . Doppler echocardiography  05/25/2009    EF 50-55%,LA mildly dilated,  LV function normal  . Carotid doppler  09/17/2008    rigt and left ICAs 0-49%;mildly  abnormal  . Cardiac catheterization N/A 03/17/2015    Procedure: Left Heart Cath and Coronary Angiography;  Surgeon: Lorretta Harp, MD;  Location: [facility];  Service: Cardiovascular;  Laterality: N/A;  . Prostatectomy    . Electrophysiologic study N/A 04/05/2015    Procedure: Cardioversion;  Surgeon: Sanda Klein, MD;  Location: [facility];  Service: Cardiovascular;  Laterality: N/A;     Home Meds: Prior to Admission medications   Medication Sig Start Date End Date Taking? Authorizing Provider  apixaban (ELIQUIS) 5 MG TABS tablet Take 1 tablet (5 mg total) by mouth 2 (two) times daily. 02/14/15  Yes Lorretta Harp, MD  aspirin EC 325 MG tablet Take 325 mg by mouth daily.   Yes Historical Provider, MD  docusate sodium (COLACE) 100 MG capsule Take 1 capsule (100 mg total) by mouth 2 (two) times daily. 04/05/15  Yes Dayna N Dunn, PA-C  furosemide (LASIX) 20 MG tablet Take 20 mg by mouth daily.   Yes Historical Provider, MD  gemfibrozil (LOPID) 600 MG tablet Take 600 mg by mouth at bedtime.    Yes Historical Provider, MD  glimepiride (AMARYL) 2 MG tablet Take 2 mg by mouth 2 (two) times daily.    Yes Historical Provider, MD  latanoprost (XALATAN) 0.005 % ophthalmic solution Place  1 drop into both eyes at bedtime.   Yes Historical Provider, MD  lisinopril (PRINIVIL,ZESTRIL) 20 MG tablet Take 1.5 tablets (30 mg total) by mouth daily. 03/28/15  Yes Burtis Junes, NP  magnesium oxide (MAG-OX) 400 MG tablet Take 1 tablet (400 mg total) by mouth daily. 04/05/15  Yes Dayna N Dunn, PA-C  metFORMIN (GLUCOPHAGE) 500 MG tablet Take 1,000 mg by mouth 2 (two) times daily.   Yes Historical Provider, MD  metoprolol succinate (TOPROL XL) 25 MG 24 hr tablet Take 1 tablet (25 mg total) by mouth daily. 01/19/15  Yes Lorretta Harp, MD  Omega-3 Fatty Acids (FISH OIL) 1000 MG CAPS Take 1,000 mg by mouth daily.     Yes Historical Provider, MD      Allergies:  Allergies  Allergen Reactions  . Tape Other (See Comments) and Rash    Causes skin redness    History   Social History  . Marital Status: Married    Spouse Name: N/A  . Number of Children: N/A  . Years of Education: N/A   Occupational History  . Retired    Social History Main Topics  . Smoking status: Former Smoker    Types: Cigarettes    Quit date: 10/31/1983  . Smokeless tobacco: Never Used     Comment: Patient smoked for 35 years,smoked 1 ppk to 1 1/2 pack a day  . Alcohol Use: No  . Drug Use: No  . Sexual Activity: Not on file   Other Topics Concern  . Not on file   Social History Narrative   Lives in [place], [place] with wife.     Family History  Problem Relation Age of Onset  . Heart disease Mother   . Lung cancer Mother   . Healthy Daughter   . Diabetes Father   . Heart disease Father   . Stroke Brother   . Heart attack Mother 62     ROS:  Please see the history of present illness.     All other systems reviewed and negative.    Physical Exam:   Blood pressure 104/70, pulse 63, height 5\' 10"  (1.778 m), weight 238 lb 12.8 oz (108.319 kg). General: Well developed, well nourished male in no acute distress. Head: Normocephalic, atraumatic, sclera non-icteric, no xanthomas, nares are without discharge. EENT: normal Lymph Nodes:  none Back: without scoliosis/kyphosis  , no CVA tendersness Neck: Negative for carotid bruits. JVD not elevated. Lungs: Clear bilaterally to auscultation without wheezes, rales, or rhonchi. Breathing is unlabored. Heart:  Irregularly irregular rate and rhythm without murmur , rubs, or gallops appreciated. Abdomen: Soft, non-tender, non-distended with normoactive bowel sounds. No hepatomegaly. No rebound/guarding. No obvious abdominal masses. Msk:  Strength and tone appear normal for age. Extremities: No clubbing or cyanosis. 1+edema.  Distal pedal pulses are 2+ and equal  bilaterally. Skin: Warm and Dry Neuro: Alert and oriented X 3. CN III-XII intact Grossly normal sensory and motor function . Psych:  Responds to questions appropriately with a normal affect.      Labs: Cardiac Enzymes No results for input(s): CKTOTAL, CKMB, TROPONINI in the last 72 hours. CBC Lab Results  Component Value Date   WBC 7.2 03/11/2015   HGB 14.7 03/11/2015   HCT 42.9 03/11/2015   MCV 86.1 03/11/2015   PLT 233 03/11/2015   PROTIME: No results for input(s): LABPROT, INR in the last 72 hours. Chemistry No results for input(s): NA, K, CL, CO2, BUN, CREATININE, CALCIUM, PROT, BILITOT, ALKPHOS, ALT,  AST, GLUCOSE in the last 168 hours.  Invalid input(s): LABALBU Lipids No results found for: CHOL, HDL, LDLCALC, TRIG BNP No results found for: PROBNP Thyroid Function Tests: No results for input(s): TSH, T4TOTAL, T3FREE, THYROIDAB in the last 72 hours.  Invalid input(s): FREET3    Miscellaneous No results found for: DDIMER  Radiology/Studies:  No results found.  EKG: Afib   6/29  Heart rate 85 Intervals-/09/36 Axis left -45   Assessment and Plan:  Persistent atrial fibrillation  Nonischemic cardiac myopathy  Coronary artery disease  Nocturnal bradycardia  Sleep apnea  Orthostatic lightheadedness  Isolated coronary disease  The patient has persistent atrial fibrillation. It is increasingly clear that there are symptoms l ikely  attributable to his atrial fibrillation, hence, it is reasonableto undertake antiarrhythmics drug supported cardioversion. This was the intention at the last visit in the hospital; however, it was no ones impression including mine that there were clearly attributable symptoms.  We will begin him on dofetilide  We will discontinue his aspirin. We will maintain him on apixoban  He has nocturnal bradycardia;  This may be aggravated by amiodarone. Hopefully will be ameliorated by treatment of his sleep apnea.  As relates to his  orthostatic lightheadedness, we will decrease his lisinopril from 30--20 and had him take his dose at night.  We will need to reassess left ventricular function following restoration of sinus rhythm; if it remains depressed, we can consider ICD implantation  There is evidence of volume overload; we will increase his Lasix from 20--40 mg for one week.      Virl Axe

## 2015-04-22 NOTE — Patient Instructions (Addendum)
Medication Instructions:  Your physician has recommended you make the following change in your medication:  1) DECREASE Lisinopril to 20 mg daily 2) INCREASE Furosemide to 40 mg daily for one week -- then return to normal dosing of 20 mg daily  Labwork: None ordered  Testing/Procedures: Your physician has recommended that you be admitted to the hospital for Tikosyn loading. Gay Filler, our pharmacist, will contact you to arrange this.  Follow-Up: To be determined after Tikosyn loading.  Any Other Special Instructions Will Be Listed Below (If Applicable). Thank you for choosing [facility]!!

## 2015-04-25 ENCOUNTER — Telehealth: Payer: Self-pay | Admitting: Internal Medicine

## 2015-04-25 ENCOUNTER — Encounter: Payer: Self-pay | Admitting: Cardiovascular Disease

## 2015-04-25 ENCOUNTER — Ambulatory Visit (INDEPENDENT_AMBULATORY_CARE_PROVIDER_SITE_OTHER): Payer: PRIVATE HEALTH INSURANCE | Admitting: Cardiovascular Disease

## 2015-04-25 VITALS — BP 144/84 | HR 81 | Ht 70.5 in | Wt 235.0 lb

## 2015-04-25 DIAGNOSIS — I10 Essential (primary) hypertension: Secondary | ICD-10-CM | POA: Diagnosis not present

## 2015-04-25 DIAGNOSIS — I480 Paroxysmal atrial fibrillation: Secondary | ICD-10-CM | POA: Diagnosis not present

## 2015-04-25 DIAGNOSIS — Z7901 Long term (current) use of anticoagulants: Secondary | ICD-10-CM

## 2015-04-25 DIAGNOSIS — I481 Persistent atrial fibrillation: Secondary | ICD-10-CM

## 2015-04-25 DIAGNOSIS — I4819 Other persistent atrial fibrillation: Secondary | ICD-10-CM

## 2015-04-25 DIAGNOSIS — G4733 Obstructive sleep apnea (adult) (pediatric): Secondary | ICD-10-CM | POA: Diagnosis not present

## 2015-04-25 NOTE — Telephone Encounter (Signed)
New message     Pt is wanting to speak with Elberta Leatherwood regarding upcoming test.  Pt want to know if appt has been set up at the hospital.  Please call to advise

## 2015-04-25 NOTE — Telephone Encounter (Signed)
Informed patient Gay Filler out of the office today and she will call back when she returns. He understands.

## 2015-04-25 NOTE — Patient Instructions (Signed)
Your physician has recommended that you have a sleep study. This test records several body functions during sleep, including: brain activity, eye movement, oxygen and carbon dioxide blood levels, heart rate and rhythm, breathing rate and rhythm, the flow of air through your mouth and nose, snoring, body muscle movements, and chest and belly movement.   This will be scheduled at [facility].

## 2015-04-25 NOTE — Progress Notes (Signed)
Patient ID: Lucas Dudley, male   DOB: Jan 17, 1941, 74 y.o.   MRN: [id number]     HPI: Lucas Dudley, is a 74 y.o. male has a history of cardiomyopathy and persistent atrial fibrillation who is referred by Dr. Gwenlyn Dudley for a sleep evaluation.  Lucas Dudley has established, the artery disease and underwent recent cardiac catheterization by Dr. Gwenlyn Dudley on 03/17/2015 which showed 100% ostial diagonal 1 occlusion, 50% proximal LAD to mid LAD stenosis and 40% PDA stenosis for which medical therapy was recommended.  The patient has an ejection fraction of 25-35% and medical therapy was recommended.  He had issues with recurrent paroxysmal atrial fibrillation and recently underwent DC cardioversion which initially was successful.  Subsequent to the patient has developed recurrent atrial fibrillation.  He has seen Dr. Caryl Dudley in follow-up and will be scheduled to undergo [facility] therapy.  During his hospitalization was noted that 75% of his pulses occurred during the sleeping hours.  The patient has been previously diagnosed as having severe obstructive sleep apnea.  In April 2012.  A diagnostic polysomnogram demonstrated an HI of approximately 90/h.  He had severe positional component.  AHI during supine sleep at 111 per hour.  He had significant oxygen desaturation to 71% with non-REM sleep and 53% with ramp sleep and had evidence for loud snoring.  The patient apparently underwent a CPAP titration trial, which I do not have the results available presently.  He states he only very briefly attempted CPAP but had difficulty with the mask at the time and stop using treatment.  Presently, he oftentimes has to sleep in a recliner in order to sleep.  He typically goes to bed at approximately 10 PM and wakes up at 7 AM, but often is up 3-4 times per night.  At times when his wife wakes up.  She notices that he is in the living room because he is unable to sleep in the bed.  He does have witnessed apnea and snores loudly.  At  times he also notes his heart pounding at nighttime.  An Epworth Sleepiness Scale today was calculated and disc endorsed at 10 shown below.    Epworth Sleepiness Scale: Situation   Chance of Dozing/Sleeping (0 = never , 1 = slight chance , 2 = moderate chance , 3 = high chance )   sitting and reading 3   watching TV 2   sitting inactive in a public place 0   being a passenger in a motor vehicle for an hour or more 2   lying down in the afternoon 0   sitting and talking to someone 1   sitting quietly after lunch (no alcohol) 2   while stopped for a few minutes in traffic as the driver 0   Total Score  10    Past Medical History  Diagnosis Date  . Myocardial infarct   . Hypertension   . Glaucoma   . Hyperlipidemia   . OSA on CPAP 2012  . Chronic constipation   . Paroxysmal atrial fibrillation   . Abnormal nuclear stress test   . Heart murmur   . Diabetes mellitus     TYPE 2  . Cancer     PROSTATE CANCER  . Chronic anticoagulation     Eliquis  . CAD (coronary artery disease)     a. Cath 03/17/15 showing 100% ostial D1, 50% prox LAD to mid LAD, 40% RPDA stenosis. Med rx.  [name] NICM (nonischemic cardiomyopathy)  Past Surgical History  Procedure Laterality Date  . Cholecystectomy    . Hernia repair    . Shoulder surgery    . Nm myocar perf wall motion  02/21/2012    EF 61% ,EXERCISE 7 METS. exercise stopped due to wheezing and shortness of breathe  . Doppler echocardiography  05/25/2009    EF 50-55%,LA mildly dilated, LV function normal  . Carotid doppler  09/17/2008    rigt and left ICAs 0-49%;mildly  abnormal  . Cardiac catheterization N/A 03/17/2015    Procedure: Left Heart Cath and Coronary Angiography;  Surgeon: Lucas Harp, MD;  Location: [facility];  Service: Cardiovascular;  Laterality: N/A;  . Prostatectomy    . Electrophysiologic study N/A 04/05/2015    Procedure: Cardioversion;  Surgeon: Lucas Klein, MD;  Location: [facility];  Service:  Cardiovascular;  Laterality: N/A;    Allergies  Allergen Reactions  . Tape Other (See Comments) and Rash    Causes skin redness    Current Outpatient Prescriptions  Medication Sig Dispense Refill  . apixaban (ELIQUIS) 5 MG TABS tablet Take 1 tablet (5 mg total) by mouth 2 (two) times daily. 180 tablet 3  . docusate sodium (COLACE) 100 MG capsule Take 1 capsule (100 mg total) by mouth 2 (two) times daily.    . furosemide (LASIX) 20 MG tablet Take 20 mg by mouth daily.    [name] gemfibrozil (LOPID) 600 MG tablet Take 600 mg by mouth at bedtime.     [name] glimepiride (AMARYL) 2 MG tablet Take 2 mg by mouth 2 (two) times daily.     [name] latanoprost (XALATAN) 0.005 % ophthalmic solution Place 1 drop into both eyes at bedtime.    [name] lisinopril (PRINIVIL,ZESTRIL) 20 MG tablet Take 1 tablet (20 mg total) by mouth daily. 60 tablet 3  . magnesium oxide (MAG-OX) 400 MG tablet Take 1 tablet (400 mg total) by mouth daily. 30 tablet 2  . metFORMIN (GLUCOPHAGE) 500 MG tablet Take 1,000 mg by mouth 2 (two) times daily.    . metoprolol succinate (TOPROL XL) 25 MG 24 hr tablet Take 1 tablet (25 mg total) by mouth daily. 90 tablet 3  . Omega-3 Fatty Acids (FISH OIL) 1000 MG CAPS Take 1,000 mg by mouth daily.      No current facility-administered medications for this visit.    History   Social History  . Marital Status: Married    Spouse Name: N/A  . Number of Children: N/A  . Years of Education: N/A   Occupational History  . Retired    Social History Main Topics  . Smoking status: Former Smoker    Types: Cigarettes    Quit date: 10/31/1983  . Smokeless tobacco: Never Used     Comment: Patient smoked for 35 years,smoked 1 ppk to 1 1/2 pack a day  . Alcohol Use: No  . Drug Use: No  . Sexual Activity: Not on file   Other Topics Concern  . Not on file   Social History Narrative   Lives in [place], [place] with wife.    Family History  Problem Relation Age of Onset  . Heart disease Mother   . Lung  cancer Mother   . Healthy Daughter   . Diabetes Father   . Heart disease Father   . Stroke Brother   . Heart attack Mother 87     ROS General: Negative; No fevers, chills, or night sweats HEENT: Negative; No changes in vision  or hearing, sinus congestion, difficulty swallowing Pulmonary: Negative; No cough, wheezing, shortness of breath, hemoptysis Cardiovascular: Negative; No chest pain, presyncope, syncope, palpatations GI: Negative; No nausea, vomiting, diarrhea, or abdominal pain GU: Negative; No dysuria, hematuria, or difficulty voiding Musculoskeletal: Negative; no myalgias, joint pain, or weakness Hematologic: Negative; no easy bruising, bleeding Endocrine: Negative; no heat/cold intolerance Neuro: Negative; no changes in balance, headaches Skin: Negative; No rashes or skin lesions Psychiatric: Negative; No behavioral problems, depression Sleep: Positive for severe, currently untreated sleep apnea and daytime sleepiness; no bruxism, restless legs, hypnogognic hallucinations, no cataplexy   Physical Exam BP 144/84 mmHg  Pulse 81  Ht 5' 10.5" (1.791 m)  Wt 235 lb (106.595 kg)  BMI 33.23 kg/m2  Wt Readings from Last 3 Encounters:  04/25/15 235 lb (106.595 kg)  04/22/15 238 lb 12.8 oz (108.319 kg)  04/13/15 239 lb 9.6 oz (108.682 kg)   General: Alert, oriented, no distress.  Skin: normal turgor, no rashes HEENT: Normocephalic, atraumatic. Pupils round and reactive; sclera anicteric; extraocular muscles intact; Fundi without hemorrhages or exudates  Nose without nasal septal hypertrophy Mouth/Parynx benign; Mallinpatti scale 3 Neck: Thick neck No JVD, no carotid briuts Lungs: clear to ausculatation and percussion; no wheezing or rales  Chest wall: No tenderness to palpation Heart: Irregular regular rhythm in the 80s, compatible with atrial fibrillation.  Abdomen: soft, nontender; no hepatosplenomehaly, BS+; abdominal aorta nontender and not dilated by palpation. Back:  No CVA tenderness Pulses 2+ Extremities: Trace edema;  no clubbinbg cyanosis, Homan's sign negative  Neurologic: grossly nonfocal; cranial nerves intact. Psychological: Normal affect and mood.  ECG (independently read by me): Atrial fibrillation, 81 bpm.  Incomplete right bundle branch block.  Poor R wave progression  LABS:  BMP Latest Ref Rng 04/05/2015 04/04/2015 03/28/2015  Glucose 65 - 99 mg/dL 214(H) 205(H) 270(H)  BUN 6 - 20 mg/dL 23(H) 26(H) 28(H)  Creatinine 0.61 - 1.24 mg/dL 0.87 1.03 1.22  BUN/Creat Ratio 10 - 22 - - -  Sodium 135 - 145 mmol/L 137 134(L) 133(L)  Potassium 3.5 - 5.1 mmol/L 4.7 4.4 4.8  Chloride 101 - 111 mmol/L 100(L) 97 94(L)  CO2 22 - 32 mmol/L 28 31 30   Calcium 8.9 - 10.3 mg/dL 8.9 9.5 9.5    No flowsheet data Dudley.   CBC Latest Ref Rng 03/11/2015 01/28/2012  WBC 4.0 - 10.5 K/uL 7.2 7.1  Hemoglobin 13.0 - 17.0 g/dL 14.7 13.1  Hematocrit 39.0 - 52.0 % 42.9 39.6  Platelets 150 - 400 K/uL 233 254   Lab Results  Component Value Date   TSH 2.663 03/11/2015     Lipid Panel  No results Dudley for: CHOL, TRIG, HDL, CHOLHDL, VLDL, LDLCALC, LDLDIRECT   RADIOLOGY: No results Dudley.    ASSESSMENT AND PLAN: Mr. Rohnan Bartleson is a 74 year old male who has a history of coronary artery disease as documented at his recent catheterization and reduced LV function, felt to be a nonischemic cardiomyopathy.  He has had a history of paroxysmal atrial fibrillation which now is persistent with recurrent AF following recent DC cardioversion.  He was on eliquis anticoagulation and there are plans to initiate Tikosyn loading.  The patient has a history of severe obstructive sleep apnea which has been not treated over the past 4 years.  His initial sleep study in April 2012 demonstrated a severe sleep apnea with significant oxygen desaturation to a nadir of 53% during REM sleep.  He also has a history of hypertension, hyperlipidemia, type 2 diabetes  mellitus and recently has  had issues with hypomagnesemia.  I feel his sleep apnea has been a significant contributor to his recurrent atrial fibrillation and cardiovascular issues.  I reviewed his initial sleep study with him from 2012 at length and discussed with him the adverse consequences of untreated severe sleep apnea on his cardiovascular health.  I do not have available a CPAP titration trial, which  was done at that time.  I have recommended an expeditious scheduling of a split-night protocol to be done at [place].  Hopefully cancellation will occur, such that he can be worked in sooner rather than later since at present there is a 2 month waiting list.  I discussed with him the recent advancement in technology's in both the CPAP units, as well as the multiple mask types.  I will see him in follow-up of his sleep study in a sleep clinic for compliance and follow-up evaluation.    Troy Sine, MD, [facility]  04/25/2015 12:53 PM

## 2015-04-26 ENCOUNTER — Encounter: Payer: Self-pay | Admitting: Cardiovascular Disease

## 2015-04-26 NOTE — Telephone Encounter (Signed)
LMOM for pt to set up Tikosyn load.

## 2015-04-27 NOTE — Telephone Encounter (Signed)
Spoke with pt.  Will be admitted on 7/20 for Tikosyn.

## 2015-05-04 ENCOUNTER — Ambulatory Visit (INDEPENDENT_AMBULATORY_CARE_PROVIDER_SITE_OTHER): Payer: PRIVATE HEALTH INSURANCE | Admitting: Pharmacist

## 2015-05-04 ENCOUNTER — Encounter (HOSPITAL_COMMUNITY): Payer: Self-pay | Admitting: General Practice

## 2015-05-04 ENCOUNTER — Inpatient Hospital Stay (HOSPITAL_COMMUNITY)
Admission: AD | Admit: 2015-05-04 | Discharge: 2015-05-07 | DRG: 310 | Disposition: A | Discharge status: Home / Self Care | Payer: Medicare Other | Source: Ambulatory Visit | Attending: Internal Medicine | Admitting: Internal Medicine

## 2015-05-04 DIAGNOSIS — M109 Gout, unspecified: Secondary | ICD-10-CM | POA: Diagnosis present

## 2015-05-04 DIAGNOSIS — I420 Dilated cardiomyopathy: Secondary | ICD-10-CM

## 2015-05-04 DIAGNOSIS — E785 Hyperlipidemia, unspecified: Secondary | ICD-10-CM | POA: Diagnosis present

## 2015-05-04 DIAGNOSIS — Z9049 Acquired absence of other specified parts of digestive tract: Secondary | ICD-10-CM | POA: Diagnosis present

## 2015-05-04 DIAGNOSIS — I480 Paroxysmal atrial fibrillation: Secondary | ICD-10-CM

## 2015-05-04 DIAGNOSIS — I2510 Atherosclerotic heart disease of native coronary artery without angina pectoris: Secondary | ICD-10-CM | POA: Diagnosis present

## 2015-05-04 DIAGNOSIS — M1990 Unspecified osteoarthritis, unspecified site: Secondary | ICD-10-CM | POA: Diagnosis present

## 2015-05-04 DIAGNOSIS — I10 Essential (primary) hypertension: Secondary | ICD-10-CM | POA: Diagnosis present

## 2015-05-04 DIAGNOSIS — Z91048 Other nonmedicinal substance allergy status: Secondary | ICD-10-CM | POA: Diagnosis not present

## 2015-05-04 DIAGNOSIS — G8929 Other chronic pain: Secondary | ICD-10-CM | POA: Diagnosis present

## 2015-05-04 DIAGNOSIS — Z85820 Personal history of malignant melanoma of skin: Secondary | ICD-10-CM

## 2015-05-04 DIAGNOSIS — I481 Persistent atrial fibrillation: Principal | ICD-10-CM | POA: Diagnosis present

## 2015-05-04 DIAGNOSIS — G4733 Obstructive sleep apnea (adult) (pediatric): Secondary | ICD-10-CM | POA: Diagnosis present

## 2015-05-04 DIAGNOSIS — J42 Unspecified chronic bronchitis: Secondary | ICD-10-CM | POA: Diagnosis present

## 2015-05-04 DIAGNOSIS — H409 Unspecified glaucoma: Secondary | ICD-10-CM | POA: Diagnosis present

## 2015-05-04 DIAGNOSIS — E119 Type 2 diabetes mellitus without complications: Secondary | ICD-10-CM

## 2015-05-04 DIAGNOSIS — Z87442 Personal history of urinary calculi: Secondary | ICD-10-CM

## 2015-05-04 DIAGNOSIS — Z87891 Personal history of nicotine dependence: Secondary | ICD-10-CM | POA: Diagnosis not present

## 2015-05-04 DIAGNOSIS — I4510 Unspecified right bundle-branch block: Secondary | ICD-10-CM | POA: Diagnosis present

## 2015-05-04 DIAGNOSIS — M545 Low back pain: Secondary | ICD-10-CM | POA: Diagnosis present

## 2015-05-04 DIAGNOSIS — Z7901 Long term (current) use of anticoagulants: Secondary | ICD-10-CM

## 2015-05-04 DIAGNOSIS — Z8546 Personal history of malignant neoplasm of prostate: Secondary | ICD-10-CM

## 2015-05-04 DIAGNOSIS — Z9079 Acquired absence of other genital organ(s): Secondary | ICD-10-CM | POA: Diagnosis present

## 2015-05-04 DIAGNOSIS — I252 Old myocardial infarction: Secondary | ICD-10-CM

## 2015-05-04 DIAGNOSIS — I4819 Other persistent atrial fibrillation: Secondary | ICD-10-CM | POA: Diagnosis present

## 2015-05-04 HISTORY — DX: Malignant neoplasm of prostate: C61

## 2015-05-04 HISTORY — DX: Malignant melanoma of scalp and neck: C43.4

## 2015-05-04 HISTORY — DX: Calculus of kidney: N20.0

## 2015-05-04 HISTORY — DX: Type 2 diabetes mellitus without complications: E11.9

## 2015-05-04 HISTORY — DX: Unspecified osteoarthritis, unspecified site: M19.90

## 2015-05-04 HISTORY — DX: Personal history of other diseases of the musculoskeletal system and connective tissue: Z87.39

## 2015-05-04 LAB — GLUCOSE, CAPILLARY
GLUCOSE-CAPILLARY: 189 mg/dL — AB (ref 65–99)
Glucose-Capillary: 231 mg/dL — ABNORMAL HIGH (ref 65–99)
Glucose-Capillary: 192 mg/dL — ABNORMAL HIGH (ref 65–99)

## 2015-05-04 LAB — BASIC METABOLIC PANEL
BUN: 24 mg/dL — AB (ref 6–23)
CALCIUM: 9.4 mg/dL (ref 8.4–10.5)
CHLORIDE: 98 meq/L (ref 96–112)
CO2: 30 mEq/L (ref 19–32)
Creatinine, Ser: 1.02 mg/dL (ref 0.40–1.50)
GFR: 76.23 mL/min (ref >60.00)
GLUCOSE: 187 mg/dL — AB (ref 70–99)
Potassium: 4.5 mEq/L (ref 3.5–5.1)
SODIUM: 137 meq/L (ref 135–145)

## 2015-05-04 LAB — MAGNESIUM: MAGNESIUM: 1.8 mg/dL (ref 1.5–2.5)

## 2015-05-04 MED ORDER — SODIUM CHLORIDE 0.9 % IJ SOLN
3.0000 mL | Freq: Two times a day (BID) | INTRAMUSCULAR | Status: DC
Start: 2015-05-04 — End: 2015-05-07
  Administered 2015-05-04 – 2015-05-07 (×5): 3 mL via INTRAVENOUS

## 2015-05-04 MED ORDER — METOPROLOL SUCCINATE ER 25 MG PO TB24
25.0000 mg | ORAL_TABLET | Freq: Every day | ORAL | Status: DC
Start: 2015-05-05 — End: 2015-05-07
  Administered 2015-05-05 – 2015-05-07 (×3): 25 mg via ORAL
  Filled 2015-05-04 (×3): qty 1

## 2015-05-04 MED ORDER — LISINOPRIL 20 MG PO TABS
20.0000 mg | ORAL_TABLET | Freq: Every day | ORAL | Status: DC
  Administered 2015-05-04 – 2015-05-06 (×3): 20 mg via ORAL
  Filled 2015-05-04 (×3): qty 1

## 2015-05-04 MED ORDER — LATANOPROST 0.005 % OP SOLN
1.0000 [drp] | Freq: Every day | OPHTHALMIC | Status: DC
  Administered 2015-05-04 – 2015-05-06 (×3): 1 [drp] via OPHTHALMIC
  Filled 2015-05-04: qty 2.5

## 2015-05-04 MED ORDER — APIXABAN 5 MG PO TABS
5.0000 mg | ORAL_TABLET | Freq: Two times a day (BID) | ORAL | Status: DC
  Administered 2015-05-04 – 2015-05-07 (×6): 5 mg via ORAL
  Filled 2015-05-04 (×6): qty 1

## 2015-05-04 MED ORDER — FUROSEMIDE 20 MG PO TABS
20.0000 mg | ORAL_TABLET | Freq: Every day | ORAL | Status: DC
  Administered 2015-05-05 – 2015-05-07 (×3): 20 mg via ORAL
  Filled 2015-05-04 (×3): qty 1

## 2015-05-04 MED ORDER — SODIUM CHLORIDE 0.9 % IV SOLN: 250.0000 mL | INTRAVENOUS | Status: DC | PRN

## 2015-05-04 MED ORDER — OMEGA-3-ACID ETHYL ESTERS 1 G PO CAPS
1.0000 g | ORAL_CAPSULE | Freq: Every day | ORAL | Status: DC
  Administered 2015-05-05 – 2015-05-07 (×3): 1 g via ORAL
  Filled 2015-05-04 (×3): qty 1

## 2015-05-04 MED ORDER — GLIMEPIRIDE 1 MG PO TABS
2.0000 mg | ORAL_TABLET | Freq: Two times a day (BID) | ORAL | Status: DC
  Administered 2015-05-05 – 2015-05-07 (×4): 2 mg via ORAL
  Filled 2015-05-04 (×4): qty 2

## 2015-05-04 MED ORDER — MAGNESIUM OXIDE 400 (241.3 MG) MG PO TABS: 400.0000 mg | ORAL_TABLET | Freq: Every day | ORAL | Status: DC

## 2015-05-04 MED ORDER — SODIUM CHLORIDE 0.9 % IJ SOLN: 3.0000 mL | INTRAMUSCULAR | Status: DC | PRN

## 2015-05-04 MED ORDER — DOCUSATE SODIUM 100 MG PO CAPS
100.0000 mg | ORAL_CAPSULE | Freq: Every day | ORAL | Status: DC
  Administered 2015-05-05 – 2015-05-07 (×3): 100 mg via ORAL
  Filled 2015-05-04 (×3): qty 1

## 2015-05-04 MED ORDER — SALINE SPRAY 0.65 % NA SOLN
1.0000 | Freq: Every day | NASAL | Status: DC
  Administered 2015-05-04 – 2015-05-06 (×3): 1 via NASAL
  Filled 2015-05-04: qty 44

## 2015-05-04 MED ORDER — METFORMIN HCL 500 MG PO TABS
1000.0000 mg | ORAL_TABLET | Freq: Two times a day (BID) | ORAL | Status: DC
  Administered 2015-05-05 – 2015-05-07 (×4): 1000 mg via ORAL
  Filled 2015-05-04 (×4): qty 2

## 2015-05-04 MED ORDER — GEMFIBROZIL 600 MG PO TABS
600.0000 mg | ORAL_TABLET | Freq: Every day | ORAL | Status: DC
  Administered 2015-05-04 – 2015-05-06 (×3): 600 mg via ORAL
  Filled 2015-05-04 (×5): qty 1

## 2015-05-04 MED ORDER — INSULIN ASPART 100 UNIT/ML ~~LOC~~ SOLN
0.0000 [IU] | Freq: Three times a day (TID) | SUBCUTANEOUS | Status: DC
  Administered 2015-05-05: 1 [IU] via SUBCUTANEOUS
  Administered 2015-05-05: 7 [IU] via SUBCUTANEOUS
  Administered 2015-05-06: 2 [IU] via SUBCUTANEOUS

## 2015-05-04 MED ORDER — DOFETILIDE 500 MCG PO CAPS
500.0000 ug | ORAL_CAPSULE | Freq: Two times a day (BID) | ORAL | Status: DC
  Administered 2015-05-04: 500 ug via ORAL
  Filled 2015-05-04: qty 1

## 2015-05-04 NOTE — Progress Notes (Signed)
Patient ID: Lucas Dudley, male DOB: [birth year], 75 y.o. MRN: [id number]     Primary Care Physician: Robert Bellow, MD Primary Cardiologist: Dr. Verlene Mayer is a 74 y.o. male seen today for Tikosyn initiation. He has a h/o CAD, NICM, EF 25-35% by recent LHC, DM, HTN, PAF x 4-5 years treated with propafenone, metoprolol and apixaban with a chadsvasc score of at least 5 . He was evaluated in the afib clinic on 6/17 after wearing a monitor. Recent holter monitor showed high afib burden with evidence for SSS with several pauses over 2 seconds but no prolonged pauses. Pt does not seem to be symptomatic with his afib, but it is thought that pt may be benefit in the long run if he could maintain sinus rhythm for improvement in EF/CHF. Given his history of CAD and cardiomyopathy, propafenone was stopped with plans to switch to Tikosyn given his lack of effect on HR compared to other class III agents.Patient was admitted to the hospital on 6/20 for TIkosyn initiation. However, there were no clearly attributable symptoms and underwent cardioversion instead. He reverted shortly thereafter to atrial fibrillation and today complains of shortness of breath.  Discussed potential side effects of Tikosyn with patient including QTc prolongation. He is aware to call the office if he misses more than 2 doses in a row. Reviewed pt's medication list. He is currently not taking any QTc prolongating or contraindicated medications, was advised to avoid Benadryl. He is anticoagulated appropriately with Eliquis and reports no missed doses within the last 30 days.  EKG reviewed by Dr. Caryl Comes. Atrial fibrillation with RVR and vent rate of 104 bpm. QTc 462 msec.   Past Medical History  Diagnosis Date  . Myocardial infarct   . Hypertension   . Glaucoma   . Hyperlipidemia   . OSA on CPAP 2012  . Chronic constipation   . Paroxysmal atrial fibrillation   . Abnormal nuclear stress test   . Heart murmur     . Diabetes mellitus     TYPE 2  . Cancer     PROSTATE CANCER  . Chronic anticoagulation     Eliquis  . CAD (coronary artery disease)     a. Cath 03/17/15 showing 100% ostial D1, 50% prox LAD to mid LAD, 40% RPDA stenosis. Med rx.  [name] NICM (nonischemic cardiomyopathy)    Current Outpatient Prescriptions on File Prior to Visit  Medication Sig Dispense Refill  . apixaban (ELIQUIS) 5 MG TABS tablet Take 1 tablet (5 mg total) by mouth 2 (two) times daily. 180 tablet 3  . docusate sodium (COLACE) 100 MG capsule Take 1 capsule (100 mg total) by mouth 2 (two) times daily.    . furosemide (LASIX) 20 MG tablet Take 20 mg by mouth daily.    [name] gemfibrozil (LOPID) 600 MG tablet Take 600 mg by mouth at bedtime.     [name] glimepiride (AMARYL) 2 MG tablet Take 2 mg by mouth 2 (two) times daily.     [name] latanoprost (XALATAN) 0.005 % ophthalmic solution Place 1 drop into both eyes at bedtime.    [name] lisinopril (PRINIVIL,ZESTRIL) 20 MG tablet Take 1 tablet (20 mg total) by mouth daily. 60 tablet 3  . magnesium oxide (MAG-OX) 400 MG tablet Take 1 tablet (400 mg total) by mouth daily. 30 tablet 2  . metFORMIN (GLUCOPHAGE) 500 MG tablet Take 1,000 mg by mouth 2 (two) times daily.    . metoprolol succinate (TOPROL XL) 25 MG 24  hr tablet Take 1 tablet (25 mg total) by mouth daily. 90 tablet 3  . Omega-3 Fatty Acids (FISH OIL) 1000 MG CAPS Take 1,000 mg by mouth daily.      No current facility-administered medications on file prior to visit.   Allergies  Allergen Reactions  . Tape Other (See Comments) and Rash    Causes skin redness   Assessment and Plan: 1. PAF: Patient's QTc acceptable to start Tikosyn. K 4.5 and Mg 1.8, SCr 1.02 with CrCl 66mL/min . Anticipate starting dose of Tikosyn 550mcg BID. Patient is aware to report to admitting.

## 2015-05-04 NOTE — Progress Notes (Signed)
Pharmacy Review for Dofetilide (Tikosyn) Initiation  Admit Complaint: 74 y.o. male admitted 05/04/2015 with atrial fibrillation to be initiated on dofetilide.   Assessment:  Patient Exclusion Criteria: If any screening criteria checked as "Yes", then  patient  should NOT receive dofetilide until criteria item is corrected. If "Yes" please indicate correction plan.  YES  NO Patient  Exclusion Criteria Correction Plan  []  [x]  Baseline QTc interval is greater than or equal to 440 msec. IF above YES box checked dofetilide contraindicated unless patient has ICD; then may proceed if QTc 500-550 msec or with known ventricular conduction abnormalities may proceed with QTc 550-600 msec. QTc = 462   []  [x]  Magnesium level is less than 1.8 mEq/l : Last magnesium:  Lab Results  Component Value Date   MG 1.8 05/04/2015         []  [x]  Potassium level is less than 4 mEq/l : Last potassium:  Lab Results  Component Value Date   K 4.5 05/04/2015         []  [x]  Patient is known or suspected to have a digoxin level greater than 2 ng/ml: No results found for: DIGOXIN    []  [x]  Creatinine clearance less than 20 ml/min (calculated using Cockcroft-Gault, actual body weight and serum creatinine): Estimated Creatinine Clearance: 79.5 mL/min (by C-G formula based on Cr of 1.02).    []  [x]  Patient has received drugs known to prolong the QT intervals within the last 48 hours (phenothiazines, tricyclics or tetracyclic antidepressants, erythromycin, H-1 antihistamines, cisapride, fluoroquinolones, azithromycin). Drugs not listed above may have an, as yet, undetected potential to prolong the QT interval, updated information on QT prolonging agents is available at this website:QT prolonging agents   []  [x]  Patient received a dose of hydrochlorothiazide (Oretic) alone or in any combination including triamterene (Dyazide, Maxzide) in the last 48 hours.   []  [x]  Patient received a medication known to increase  dofetilide plasma concentrations prior to initial dofetilide dose:  . Trimethoprim (Primsol, Proloprim) in the last 36 hours . Verapamil (Calan, Verelan) in the last 36 hours or a sustained release dose in the last 72 hours . Megestrol (Megace) in the last 5 days  . Cimetidine (Tagamet) in the last 6 hours . Ketoconazole (Nizoral) in the last 24 hours . Itraconazole (Sporanox) in the last 48 hours  . Prochlorperazine (Compazine) in the last 36 hours    []  [x]  Patient is known to have a history of torsades de pointes; congenital or acquired long QT syndromes.   []  [x]  Patient has received a Class 1 antiarrhythmic with less than 2 half-lives since last dose. (Disopyramide, Quinidine, Procainamide, Lidocaine, Mexiletine, Flecainide, Propafenone)   []  [x]  Patient has received amiodarone therapy in the past 3 months or amiodarone level is greater than 0.3 ng/ml.    Patient has been appropriately anticoagulated with apixaban.  Ordering provider was confirmed at [email or web address] if they are not listed on the [place] Prescribers list.  Goal of Therapy: Follow renal function, electrolytes, potential drug interactions, and dose adjustment. Provide education and 1 week supply at discharge.  Plan:  [x]   Physician selected initial dose within range recommended for patients level of renal function - will monitor for response.  []   Physician selected initial dose outside of range recommended for patients level of renal function - will discuss if the dose should be altered at this time.   [facility] One Calculated CrCl  Dose q12h  [x]  > 60 ml/min 500 mcg  []  40-60  ml/min 250 mcg  []  20-40 ml/min 125 mcg   2. Follow up QTc after the first 5 doses, renal function, electrolytes (K & Mg) daily x 3     days, dose adjustment, success of initiation and facilitate 1 week discharge supply as     clinically indicated.  3. Initiate Tikosyn education video (Call [phone] and ask for video # 116).  4.  Place Enrollment Form on the chart for discharge supply of dofetilide.   Erin Hearing PharmD., BCPS Clinical Pharmacist Pager [phone] 05/04/2015 5:32 PM

## 2015-05-04 NOTE — H&P (Signed)
Lucas Dudley is an 74 y.o. male.    Primary Cardiologist: Dr. Gwenlyn Found Sleep: Dr. Claiborne Billings EP: Dr. Caryl Comes  OJJ:[id number] D, MD  Chief Complaint: persistent a fib with recurrent a fib after drug free DCCV, here for Tikosyn load.   HPI:  Lucas Dudley is a 74 y.o. male seen by Dr. Caryl Comes following a hospital evaluation for atrial fibrillation at which time he had been admitted for Tikosyn initiation. There were no clearly attributable symptoms and was elected to undertake drug-free cardioversion. He reverted shortly thereafter to atrial fibrillation. He has no palpitations. However, looking back over the last year, both he and his wife make the observation that he has less energy. He also has been sleeping in a chair because of orthopnea. He has not had peripheral edema.  Additional Hx. DM, HTN, PAF x 4-5 years treated with propafenone, metoprolol and apixaban with a chadsvasc score of at least 5 . He was evaluated in the afib clinic on 6/17 after wearing a monitor. Recent holter monitor showed high afib burden with evidence for SSS with several pauses over 2 seconds but no prolonged pauses.   Catheterization 6/16 demonstrated nonobstructive coronary disease apart from a single lesion in D1. Ejection fraction 25-35%. Myoview demonstrated ejection fraction of 40% some months earlier. Ejection fraction 2013 had been 55-60%   Left atrial dimension 47/1.99/31.7  He has been dx with sleep apnea and plans for a split study and to retry CPAP.  Some SOB mostly on exertion.  1 episode of chest pain brief and he took ASA.   Today K+ 4.5, mag 1.8  EKG reviewed by Dr. Lovena Le with a fib with RVR at 104, LAD, incomplete RBBB, QTc 462 ms    Past Medical History  Diagnosis Date  . Hypertension   . Glaucoma   . Hyperlipidemia   . OSA on CPAP 2012  . Chronic constipation   . Paroxysmal atrial fibrillation   . Abnormal nuclear stress test   . Chronic anticoagulation     Eliquis  .  CAD (coronary artery disease)     a. Cath 03/17/15 showing 100% ostial D1, 50% prox LAD to mid LAD, 40% RPDA stenosis. Med rx.  [name] NICM (nonischemic cardiomyopathy)   . Kidney stones     "found during prostate OR; still in there as far as I know" (05/04/2015)  . Prostate cancer   . 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 Heart murmur     "all my life"  . Myocardial infarct     "I was told 5-10 yr ago I'd had a MI in my 20's; now they say I didn't" (05/04/2015)  . Childhood asthma   . Pneumonia 1950's X 1  . Chronic bronchitis     "had it several times til I quit smoking"  . Type II diabetes mellitus   . Arthritis     "hands probably" (05/04/2015)  . Chronic lower back pain   . History of gout     Past Surgical History  Procedure Laterality Date  . Shoulder open rotator cuff repair Right X 2  . Nm myocar perf wall motion  02/21/2012    EF 61% ,EXERCISE 7 METS. exercise stopped due to wheezing and shortness of breathe  . Doppler echocardiography  05/25/2009    EF 50-55%,LA mildly dilated, LV function normal  . Carotid doppler  09/17/2008    rigt and  left ICAs 0-49%;mildly  abnormal  . Cardiac catheterization N/A 03/17/2015    Procedure: Left Heart Cath and Coronary Angiography;  Surgeon: Lorretta Harp, MD;  Location: [facility];  Service: Cardiovascular;  Laterality: N/A;  . Prostatectomy    . Electrophysiologic study N/A 04/05/2015    Procedure: Cardioversion;  Surgeon: Sanda Klein, MD;  Location: [facility];  Service: Cardiovascular;  Laterality: N/A;  . Laparoscopic cholecystectomy    . Hernia repair    . Abdominal hernia repair      w/mesh  . Excisional hemorrhoidectomy      "inside and out"  . Fine needle aspiration Right     knee; "drew ~ 1 quart off"  . Melanoma excision Right     "neck"    Family History  Problem Relation Age of Onset  . Heart disease Mother   . Lung cancer Mother   . Healthy Daughter   . Diabetes Father   .  Heart disease Father   . Stroke Brother   . Heart attack Mother 81   Social History:  reports that he quit smoking about 31 years ago. His smoking use included Cigarettes. He has a 54 pack-year smoking history. He has never used smokeless tobacco. He reports that he drinks alcohol. He reports that he does not use illicit drugs.  Allergies:  Allergies  Allergen Reactions  . Tape Other (See Comments) and Rash    Causes skin redness    Medications Prior to Admission  Medication Sig Dispense Refill  . apixaban (ELIQUIS) 5 MG TABS tablet Take 1 tablet (5 mg total) by mouth 2 (two) times daily. 180 tablet 3  . docusate sodium (COLACE) 100 MG capsule Take 1 capsule (100 mg total) by mouth 2 (two) times daily.    . furosemide (LASIX) 20 MG tablet Take 20 mg by mouth daily.    [name] gemfibrozil (LOPID) 600 MG tablet Take 600 mg by mouth at bedtime.     [name] glimepiride (AMARYL) 2 MG tablet Take 2 mg by mouth 2 (two) times daily.     [name] latanoprost (XALATAN) 0.005 % ophthalmic solution Place 1 drop into both eyes at bedtime.    [name] lisinopril (PRINIVIL,ZESTRIL) 20 MG tablet Take 1 tablet (20 mg total) by mouth daily. 60 tablet 3  . magnesium oxide (MAG-OX) 400 MG tablet Take 1 tablet (400 mg total) by mouth daily. 30 tablet 2  . metFORMIN (GLUCOPHAGE) 500 MG tablet Take 1,000 mg by mouth 2 (two) times daily.    . metoprolol succinate (TOPROL XL) 25 MG 24 hr tablet Take 1 tablet (25 mg total) by mouth daily. 90 tablet 3  . Omega-3 Fatty Acids (FISH OIL) 1000 MG CAPS Take 1,000 mg by mouth daily.       Results for orders placed or performed during the hospital encounter of 05/04/15 (from the past 48 hour(s))  Glucose, capillary     Status: Abnormal   Collection Time: 05/04/15  2:25 PM  Result Value Ref Range   Glucose-Capillary 231 (H) 65 - 99 mg/dL   No results found.  ROS: General:no colds or fevers, no weight changes Skin:no rashes or ulcers HEENT:no blurred vision, no congestion CV:see  HPI PUL:see HPI GI:no diarrhea constipation or melena, no indigestion GU:no hematuria, no dysuria MS:no joint pain, no claudication, some lower ext edema Neuro:no syncope, no lightheadedness Endo:no diabetes, no thyroid disease  Blood pressure 148/95, pulse 102, temperature 97.8 F (36.6 C), temperature source Oral, resp.  rate 16, height 5\' 10"  (1.778 m), weight 232 lb 1.6 oz (105.28 kg), SpO2 95 %. PE: General:Pleasant affect, NAD Skin:Warm and dry, brisk capillary refill HEENT:normocephalic, sclera clear, mucus membranes moist Neck:supple, no JVD, no bruits  Heart:S1S2 RRR without murmur, gallup, rub or click Lungs:clear without rales, rhonchi, or wheezes BMW:UXLK, non tender, + BS, do not palpate liver spleen or masses Ext:no lower ext edema, 2+ pedal pulses, 2+ radial pulses Neuro:alert and oriented, MAE, follows commands, + facial symmetry    Assessment/Plan Principal Problem:   Persistent atrial fibrillation Active Problems:   Essential hypertension   Diabetes mellitus type 2, noninsulin dependent   Chronic anticoagulation   Severe obstructive sleep apnea   Cardiomyopathy, dilated, nonischemic   Plan to begin Tikosyn, no missed dose of Eliquis. K+ and Mg+ are stable.  Dr. Caryl Comes to see.   Cr. Cl 97.44 mL/min, will begin Tikosyn 500 mg BID - will review with Dr. Caryl Comes.  He will need repeat echo with restoration of SR.   Cecilie Kicks R Nurse Practitioner Certified [place] Pager [phone] or after 5pm or weekends call [phone] 05/04/2015, 4:06 PM     As above  anticapte DCCV Friday if not cardioverted spontaneously  Risks reviewed  Sleep study pending as outpt

## 2015-05-05 ENCOUNTER — Encounter (HOSPITAL_COMMUNITY): Payer: Self-pay | Admitting: Certified Registered Nurse Anesthetist

## 2015-05-05 LAB — GLUCOSE, CAPILLARY
GLUCOSE-CAPILLARY: 146 mg/dL — AB (ref 65–99)
GLUCOSE-CAPILLARY: 106 mg/dL — AB (ref 65–99)
GLUCOSE-CAPILLARY: 177 mg/dL — AB (ref 65–99)
Glucose-Capillary: 315 mg/dL — ABNORMAL HIGH (ref 65–99)

## 2015-05-05 LAB — BASIC METABOLIC PANEL
ANION GAP: 9 (ref 5–15)
BUN: 21 mg/dL — ABNORMAL HIGH (ref 6–20)
CO2: 30 mmol/L (ref 22–32)
CREATININE: 1.04 mg/dL (ref 0.61–1.24)
Calcium: 9.3 mg/dL (ref 8.9–10.3)
Chloride: 101 mmol/L (ref 101–111)
GFR calc non Af Amer: >60 mL/min (ref >60)
GLUCOSE: 176 mg/dL — AB (ref 65–99)
POTASSIUM: 4.5 mmol/L (ref 3.5–5.1)
Sodium: 140 mmol/L (ref 135–145)

## 2015-05-05 LAB — MAGNESIUM: MAGNESIUM: 1.7 mg/dL (ref 1.7–2.4)

## 2015-05-05 MED ORDER — MAGNESIUM SULFATE 2 GM/50ML IV SOLN
2.0000 g | Freq: Once | INTRAVENOUS | Status: AC
  Administered 2015-05-05: 2 g via INTRAVENOUS
  Filled 2015-05-05: qty 50

## 2015-05-05 MED ORDER — MAGNESIUM SULFATE IN D5W 10-5 MG/ML-% IV SOLN: 1.0000 g | Freq: Once | INTRAVENOUS | Status: DC

## 2015-05-05 MED ORDER — DOFETILIDE 250 MCG PO CAPS
375.0000 ug | ORAL_CAPSULE | Freq: Two times a day (BID) | ORAL | Status: DC
  Administered 2015-05-05 – 2015-05-07 (×5): 375 ug via ORAL
  Filled 2015-05-05 (×9): qty 1

## 2015-05-05 MED ORDER — MAGNESIUM OXIDE 400 (241.3 MG) MG PO TABS
400.0000 mg | ORAL_TABLET | Freq: Two times a day (BID) | ORAL | Status: DC
  Administered 2015-05-05 – 2015-05-07 (×4): 400 mg via ORAL
  Filled 2015-05-05 (×4): qty 1

## 2015-05-05 NOTE — Discharge Instructions (Addendum)

## 2015-05-05 NOTE — Progress Notes (Signed)
SUBJECTIVE: The patient is doing well today.  At this time, he denies chest pain, shortness of breath, or any new concerns.  CURRENT MEDICATIONS: . apixaban  5 mg Oral BID  . docusate sodium  100 mg Oral Daily  . dofetilide  375 mcg Oral BID  . furosemide  20 mg Oral Daily  . gemfibrozil  600 mg Oral QHS  . glimepiride  2 mg Oral BID WC  . insulin aspart  0-9 Units Subcutaneous TID WC  . latanoprost  1 drop Both Eyes QHS  . lisinopril  20 mg Oral QHS  . magnesium oxide  400 mg Oral BID  . metFORMIN  1,000 mg Oral BID WC  . metoprolol succinate  25 mg Oral Daily  . omega-3 acid ethyl esters  1 g Oral Daily  . sodium chloride  1 spray Each Nare QHS  . sodium chloride  3 mL Intravenous Q12H      OBJECTIVE: Physical Exam: Filed Vitals:   05/04/15 1426 05/04/15 2033 05/05/15 0459  BP: 148/95 143/75 162/96  Pulse: 102 73   Temp: 97.8 F (36.6 C) 97.8 F (36.6 C) 98.4 F (36.9 C)  TempSrc: Oral Oral Oral  Resp: 16    Height: 5\' 10"  (1.778 m)    Weight: 232 lb 1.6 oz (105.28 kg)  233 lb (105.688 kg)  SpO2: 95% 97% 93%    Intake/Output Summary (Last 24 hours) at 05/05/15 0844 Last data filed at 05/04/15 1755  Gross per 24 hour  Intake    240 ml  Output      0 ml  Net    240 ml    Telemetry reveals atrial fibrillation  GEN- The patient is well appearing, alert and oriented x 3 today.   Head- normocephalic, atraumatic Eyes-  Sclera clear, conjunctiva pink Ears- hearing intact Oropharynx- clear Neck- supple  Lungs- Clear to ausculation bilaterally, normal work of breathing Heart- Irregular rate and rhythm  GI- soft, NT, ND, + BS Extremities- no clubbing, cyanosis, or edema Skin- no rash or lesion Psych- euthymic mood, full affect Neuro- strength and sensation are intact  LABS: Basic Metabolic Panel:  Recent Labs  05/04/15 1106 05/05/15 0326  NA 137 140  K 4.5 4.5  CL 98 101  CO2 30 30  GLUCOSE 187* 176*  BUN 24* 21*  CREATININE 1.02 1.04  CALCIUM  9.4 9.3  MG 1.8 1.7    ASSESSMENT AND PLAN:  Principal Problem:   Persistent atrial fibrillation Active Problems:   Essential hypertension   Diabetes mellitus type 2, noninsulin dependent   Chronic anticoagulation   Severe obstructive sleep apnea   Cardiomyopathy, dilated, nonischemic  1.  Persistent atrial fibrillation The patient has persistent atrial fibrillation and presented 05/04/15 for Tikosyn loading Mg low this morning and 2grams IV were given - will increase po Mag-ox at home to 400mg  twice daily Keep K >3.9, Mg>1.8 QTc a little long this morning.  Will decrease Tikosyn to 351mcg twice daily until reviewed by Dr Caryl Comes later today. Will need DCCV tomorrow if he remains in AF - I will arrange, NPO after midnight tonight Continue Eliquis for CHADS2VASC of at least 5  2.  Sleep apnea Scheduled for repeat split night sleep study 06/2015  3.  HTN BP elevated this morning Anti-hypertensives recently decreased 2/2 orthostasis Will defer to Dr Caryl Comes medication adjustment  4.  Non ischemic cardiomyopathy ?tachycardia mediated Will need reassessment of EF after 3 months of SR  Anticipate discharge  on Saturday - will need 1 and 4 week follow up in AF clinic and follow up with Dr Caryl Comes in 3 months (appts entered in AVS).   Chanetta Marshall, NP 05/05/2015 8:44 AM

## 2015-05-05 NOTE — Progress Notes (Signed)
Ekg at 2300 showed pt's Qtc increased from 482 to 505. On call MD notified Will continue to monitor.

## 2015-05-05 NOTE — Progress Notes (Signed)
Pt's EKG at 0000 showed pt's QTc increased from 418 to 503. Spoke with Dr. Jeffie Pollock; received orders to hold pt's morning Tikosyn dose until EP rounds.

## 2015-05-05 NOTE — Care Management Note (Addendum)
Case Management Note  Patient Details  Name: Lucas Dudley MRN: [id number] Date of Birth: [birth year]  Subjective/Objective:     Pt admitted for Tikosyn Load.                Action/Plan: Pt will need Rx for 7 day supply of Tikosyn no refills. CM will assist with the 7 day supply of Tikosyn once pt stable for d/c. Pt uses mail order supply via [facility] for 90 day supply. Pt would like a Rx for 90 day supply with refills.  Mail Order Rx's usually take 7-10 days for delivery. Local Pharamcy for first Rx for 30 day [facility] [place]. [facility] and medication can be [facility], none available in store.      Expected Discharge Date:                  Expected Discharge Plan:  Home/Self Care  In-House Referral:  NA  Discharge planning Services  CM Consult  Post Acute Care Choice:  NA Choice offered to:  NA  DME Arranged:  N/A DME Agency:  NA  HH Arranged:  NA HH Agency:  NA  Status of Service:  Completed, signed off  Medicare Important Message Given:    Date Medicare IM Given:    Medicare IM give by:    Date Additional Medicare IM Given:    Additional Medicare Important Message give by:     If discussed at [place] of Stay Meetings, dates discussed:    Additional Comments: 1035 05-06-15 Jacqlyn Krauss, RN, BSN [phone] S/W JESSICA @ [facility] # [phone]   TIKOSYN 500 MCG 30 DAY SUPPLY  COVER- YES  CO-PAY - $25.00 FOR 30 DAY          $ 75.00 FOR 90 DAY SUPPLY  PRIOR APPROVAL -NO  PHARMACY- [facility]   TIKOSYN 250 MCG , 125 MCG  SAME   TIKSOYN : 375 MCG -NOT ON FORMULARY    DOFETILIDE 500 MCG , 250 MCG , 125 MCG    CO- PAY - $ 30.00 FOR 90 DAY SUPPLY           $ 10.00 FOR 30 DAY SUPPLY  375 MCG ALSO NOT ON Gwyneth Sprout, RN 05/05/2015, 12:20 PM

## 2015-05-06 ENCOUNTER — Ambulatory Visit: Payer: PRIVATE HEALTH INSURANCE | Admitting: Pharmacist

## 2015-05-06 ENCOUNTER — Encounter (HOSPITAL_COMMUNITY)
Admission: AD | Disposition: A | Discharge status: Home / Self Care | Payer: Self-pay | Source: Ambulatory Visit | Attending: Internal Medicine

## 2015-05-06 ENCOUNTER — Encounter (HOSPITAL_COMMUNITY): Payer: Self-pay | Admitting: *Deleted

## 2015-05-06 LAB — GLUCOSE, CAPILLARY
GLUCOSE-CAPILLARY: 179 mg/dL — AB (ref 65–99)
GLUCOSE-CAPILLARY: 153 mg/dL — AB (ref 65–99)
GLUCOSE-CAPILLARY: 188 mg/dL — AB (ref 65–99)
Glucose-Capillary: 208 mg/dL — ABNORMAL HIGH (ref 65–99)

## 2015-05-06 LAB — MAGNESIUM: Magnesium: 1.8 mg/dL (ref 1.7–2.4)

## 2015-05-06 LAB — BASIC METABOLIC PANEL
ANION GAP: 10 (ref 5–15)
Anion gap: 8 (ref 5–15)
BUN: 16 mg/dL (ref 6–20)
BUN: 16 mg/dL (ref 6–20)
CALCIUM: 9.1 mg/dL (ref 8.9–10.3)
CO2: 27 mmol/L (ref 22–32)
CO2: 27 mmol/L (ref 22–32)
Calcium: 9.1 mg/dL (ref 8.9–10.3)
Chloride: 100 mmol/L — ABNORMAL LOW (ref 101–111)
Chloride: 101 mmol/L (ref 101–111)
Creatinine, Ser: 0.99 mg/dL (ref 0.61–1.24)
Creatinine, Ser: 1.02 mg/dL (ref 0.61–1.24)
GFR calc Af Amer: >60 mL/min (ref >60)
GLUCOSE: 157 mg/dL — AB (ref 65–99)
Glucose, Bld: 195 mg/dL — ABNORMAL HIGH (ref 65–99)
Potassium: 4.8 mmol/L (ref 3.5–5.1)
Potassium: 4.6 mmol/L (ref 3.5–5.1)
SODIUM: 136 mmol/L (ref 135–145)
Sodium: 137 mmol/L (ref 135–145)

## 2015-05-06 LAB — PROTIME-INR
INR: 1.3 (ref 0.00–1.49)
Prothrombin Time: 16.3 seconds — ABNORMAL HIGH (ref 11.6–15.2)

## 2015-05-06 SURGERY — CARDIOVERSION
Anesthesia: Monitor Anesthesia Care

## 2015-05-06 SURGERY — CARDIOVERSION (CATH LAB)

## 2015-05-06 MED ORDER — SODIUM CHLORIDE 0.9 % IJ SOLN: 3.0000 mL | Freq: Two times a day (BID) | INTRAMUSCULAR | Status: DC

## 2015-05-06 MED ORDER — ZOLPIDEM TARTRATE 5 MG PO TABS: 5.0000 mg | ORAL_TABLET | Freq: Every evening | ORAL | Status: DC | PRN

## 2015-05-06 MED ORDER — SODIUM CHLORIDE 0.9 % IV SOLN: 250.0000 mL | INTRAVENOUS | Status: DC

## 2015-05-06 MED ORDER — ONDANSETRON HCL 4 MG/2ML IJ SOLN: 4.0000 mg | Freq: Four times a day (QID) | INTRAMUSCULAR | Status: DC | PRN

## 2015-05-06 MED ORDER — SODIUM CHLORIDE 0.9 % IV SOLN
INTRAVENOUS | Status: AC
  Administered 2015-05-06: 10:00:00 via INTRAVENOUS

## 2015-05-06 MED ORDER — ALPRAZOLAM 0.25 MG PO TABS: 0.2500 mg | ORAL_TABLET | Freq: Two times a day (BID) | ORAL | Status: DC | PRN

## 2015-05-06 MED ORDER — ACETAMINOPHEN 325 MG PO TABS
650.0000 mg | ORAL_TABLET | ORAL | Status: DC | PRN
Start: 2015-05-06 — End: 2015-05-07
  Administered 2015-05-06: 650 mg via ORAL
  Filled 2015-05-06: qty 2

## 2015-05-06 MED ORDER — SODIUM CHLORIDE 0.9 % IJ SOLN: 3.0000 mL | INTRAMUSCULAR | Status: DC | PRN

## 2015-05-06 NOTE — Progress Notes (Signed)
SUBJECTIVE: The patient is doing well today.  At this time, he denies chest pain, shortness of breath, or any new concerns.  CURRENT MEDICATIONS: . apixaban  5 mg Oral BID  . docusate sodium  100 mg Oral Daily  . dofetilide  375 mcg Oral BID  . furosemide  20 mg Oral Daily  . gemfibrozil  600 mg Oral QHS  . glimepiride  2 mg Oral BID WC  . insulin aspart  0-9 Units Subcutaneous TID WC  . latanoprost  1 drop Both Eyes QHS  . lisinopril  20 mg Oral QHS  . magnesium oxide  400 mg Oral BID  . metFORMIN  1,000 mg Oral BID WC  . metoprolol succinate  25 mg Oral Daily  . omega-3 acid ethyl esters  1 g Oral Daily  . sodium chloride  1 spray Each Nare QHS  . sodium chloride  3 mL Intravenous Q12H  . sodium chloride  3 mL Intravenous Q12H   . sodium chloride    . sodium chloride      OBJECTIVE: Physical Exam: Filed Vitals:   05/05/15 1808 05/05/15 2031 05/06/15 0531 05/06/15 0816  BP: 132/89 136/87 115/80 117/83  Pulse: 74 44 48 122  Temp: 98.5 F (36.9 C) 97.8 F (36.6 C) 98.4 F (36.9 C)   TempSrc:  Oral Oral   Resp:      Height:      Weight:   231 lb 14.4 oz (105.189 kg)   SpO2: 94% 97% 96% 96%   No intake or output data in the 24 hours ending 05/06/15 0935  Telemetry reveals atrial fibrillation  GEN- The patient is well appearing, alert and oriented x 3 today.   Head- normocephalic, atraumatic Eyes-  Sclera clear, conjunctiva pink Ears- hearing intact Oropharynx- clear Neck- supple  Lungs- Clear to ausculation bilaterally, normal work of breathing Heart- Irregular rate and rhythm  GI- soft, NT, ND, + BS Extremities- no clubbing, cyanosis, or edema Skin- no rash or lesion Psych- euthymic mood, full affect Neuro- strength and sensation are intact  LABS: Basic Metabolic Panel:  Recent Labs  05/05/15 0326 05/06/15 0301  NA 140 137  K 4.5 4.8  CL 101 100*  CO2 30 27  GLUCOSE 176* 157*  BUN 21* 16  CREATININE 1.04 0.99  CALCIUM 9.3 9.1  MG 1.7 1.8      ASSESSMENT AND PLAN:  Principal Problem:   Persistent atrial fibrillation Active Problems:   Essential hypertension   Diabetes mellitus type 2, noninsulin dependent   Chronic anticoagulation   Severe obstructive sleep apnea   Cardiomyopathy, dilated, nonischemic  1.  Persistent atrial fibrillation The patient has persistent atrial fibrillation and presented 05/04/15 for Tikosyn loading Mg low this morning and 2grams IV were given - will increase po Mag-ox at home to 400mg  twice daily Keep K >3.9, Mg>1.8 QTc a little long this morning.    Tikosyn to 331mcg twice daily   Continue Eliquis for CHADS2VASC of at least 5  2.  Sleep apnea Scheduled for repeat split night sleep study 06/2015  Have tried to get changed to earlier date at [facility]  3.  HTN BP better    4.  Non ischemic cardiomyopathy ?tachycardia mediated Will need reassessment of EF after 3 months of SR  Anticipate discharge on Saturday - will need 1 and 4 week follow up in AF clinic and follow up with Dr Caryl Comes in 3 months (appts entered in AVS).   Chanetta Marshall,  NP 05/06/2015 9:35 AM  For cardioversion today Home in am if in sinus

## 2015-05-06 NOTE — Progress Notes (Signed)
Pt converted to NSR (w/1st degree block) at 1024 today. HR=60's.   Pt states he feels a bit better.  Family at bedside.  [name], [place] notified.

## 2015-05-07 ENCOUNTER — Encounter (HOSPITAL_COMMUNITY): Payer: Self-pay | Admitting: Nurse Practitioner

## 2015-05-07 LAB — BASIC METABOLIC PANEL
Anion gap: 8 (ref 5–15)
BUN: 17 mg/dL (ref 6–20)
CO2: 29 mmol/L (ref 22–32)
Calcium: 9 mg/dL (ref 8.9–10.3)
Chloride: 98 mmol/L — ABNORMAL LOW (ref 101–111)
Creatinine, Ser: 0.97 mg/dL (ref 0.61–1.24)
GFR calc non Af Amer: >60 mL/min (ref >60)
Glucose, Bld: 166 mg/dL — ABNORMAL HIGH (ref 65–99)
Potassium: 4.6 mmol/L (ref 3.5–5.1)
Sodium: 135 mmol/L (ref 135–145)

## 2015-05-07 LAB — GLUCOSE, CAPILLARY: Glucose-Capillary: 206 mg/dL — ABNORMAL HIGH (ref 65–99)

## 2015-05-07 LAB — MAGNESIUM: MAGNESIUM: 1.9 mg/dL (ref 1.7–2.4)

## 2015-05-07 MED ORDER — DOFETILIDE 125 MCG PO CAPS: 375.0000 ug | ORAL_CAPSULE | Freq: Two times a day (BID) | ORAL | Status: DC

## 2015-05-07 MED ORDER — MAGNESIUM OXIDE 400 MG PO TABS: 400.0000 mg | ORAL_TABLET | Freq: Two times a day (BID) | ORAL | Status: DC

## 2015-05-07 NOTE — Progress Notes (Signed)
Patient ID: RHET RORKE, male   DOB: March 16, 1941, 74 y.o.   MRN: [id number]    SUBJECTIVE: The patient is doing well today.  At this time, he denies chest pain, shortness of breath, or any new concerns.  [name] apixaban  5 mg Oral BID  . docusate sodium  100 mg Oral Daily  . dofetilide  375 mcg Oral BID  . furosemide  20 mg Oral Daily  . gemfibrozil  600 mg Oral QHS  . glimepiride  2 mg Oral BID WC  . insulin aspart  0-9 Units Subcutaneous TID WC  . latanoprost  1 drop Both Eyes QHS  . lisinopril  20 mg Oral QHS  . magnesium oxide  400 mg Oral BID  . metFORMIN  1,000 mg Oral BID WC  . metoprolol succinate  25 mg Oral Daily  . omega-3 acid ethyl esters  1 g Oral Daily  . sodium chloride  1 spray Each Nare QHS  . sodium chloride  3 mL Intravenous Q12H  . sodium chloride  3 mL Intravenous Q12H   . sodium chloride      OBJECTIVE: Physical Exam: Filed Vitals:   05/06/15 1006 05/06/15 2055 05/07/15 0536 05/07/15 0827  BP: 114/88 121/69 115/59 121/53  Pulse: 86 59 61 63  Temp:  97.6 F (36.4 C) 98.6 F (37 C)   TempSrc:  Oral Oral   Resp:  18 18   Height:      Weight:   233 lb (105.688 kg)   SpO2:  96% 93%     Intake/Output Summary (Last 24 hours) at 05/07/15 3300 Last data filed at 05/06/15 1031  Gross per 24 hour  Intake     10 ml  Output      0 ml  Net     10 ml    Telemetry reveals sinus rhythm  GEN- The patient is well appearing, alert and oriented x 3 today.   Neck- supple, no JVP Lungs- Clear to ausculation bilaterally, normal work of breathing Heart- Regular rate and rhythm, no murmurs, rubs or gallops, PMI not laterally displaced GI- soft, NT, ND, + BS Extremities- no clubbing, cyanosis, or edema Skin- no rash or lesion Psych- euthymic mood, full affect Neuro- strength and sensation are intact  LABS: Basic Metabolic Panel:  Recent Labs  05/06/15 0301 05/06/15 1040 05/07/15 0432  NA 137 136 135  K 4.8 4.6 4.6  CL 100* 101 98*  CO2 27 27 29   GLUCOSE  157* 195* 166*  BUN 16 16 17   CREATININE 0.99 1.02 0.97  CALCIUM 9.1 9.1 9.0  MG 1.8  --  1.9   Liver Function Tests: No results for input(s): AST, ALT, ALKPHOS, BILITOT, PROT, ALBUMIN in the last 72 hours. No results for input(s): LIPASE, AMYLASE in the last 72 hours. CBC: No results for input(s): WBC, NEUTROABS, HGB, HCT, MCV, PLT in the last 72 hours. Cardiac Enzymes: No results for input(s): CKTOTAL, CKMB, CKMBINDEX, TROPONINI in the last 72 hours. BNP: Invalid input(s): POCBNP D-Dimer: No results for input(s): DDIMER in the last 72 hours. Hemoglobin A1C: No results for input(s): HGBA1C in the last 72 hours. Fasting Lipid Panel: No results for input(s): CHOL, HDL, LDLCALC, TRIG, CHOLHDL, LDLDIRECT in the last 72 hours. Thyroid Function Tests: No results for input(s): TSH, T4TOTAL, T3FREE, THYROIDAB in the last 72 hours.  Invalid input(s): FREET3 Anemia Panel: No results for input(s): VITAMINB12, FOLATE, FERRITIN, TIBC, IRON, RETICCTPCT in the last 72 hours.  RADIOLOGY: No  results found.  ASSESSMENT AND PLAN:  Principal Problem:   Persistent atrial fibrillation Active Problems:   Essential hypertension   Diabetes mellitus type 2, noninsulin dependent   Chronic anticoagulation   Severe obstructive sleep apnea   Cardiomyopathy, dilated, nonischemic Admit for Tikosyn load Rec: he is maintaining NSR. He will continue his current medications. 12 lead ECG from last night ok. Tikosyn 375 q 12 at discharge and he will need a followup in a week with a 12 lead ECG and BMP and Mg level.  Cristopher Peru, MD 05/07/2015 9:22 AM

## 2015-05-07 NOTE — Discharge Summary (Signed)
Discharge Summary   Patient ID: ZAE KIRTZ,  MRN: [id number], DOB/AGE: 1941/06/19 74 y.o.  Admit date: 05/04/2015 Discharge date: 05/07/2015  Primary Care Provider: Robert Bellow Primary Cardiologist: Adora Fridge, MD / S. Caryl Comes, MD   Discharge Diagnoses Principal Problem:   Persistent atrial fibrillation  **s/p tikosyn loading and DCCV this admission.  Active Problems:   Essential hypertension   Diabetes mellitus type 2, noninsulin dependent   Cardiomyopathy, dilated, nonischemic   Hyperlipidemia   Chronic anticoagulation   Severe obstructive sleep apnea   Allergies Allergies  Allergen Reactions  . Tape Rash and Other (See Comments)    Causes skin redness, Use paper tape only.    Procedures  Direct Current Cardioversion 7.21.2016  Cardioversion with synchronized biphasic 150J shock.  Evaluation: Findings: Post procedure EKG shows: NSR Complications: None Patient did tolerate procedure well. _____________   History of Present Illness  74 year old male with a prior history of CAD and ICM, with an EF of 40%.  He also has a h/o PAF and was previously treated with propafenone.  He is on chronic anticoagulation with Eliquis.  He recently underwent DCCV, w/o antiarrhythmic support, and subsequently reverted back to Afib.  He was seen by Dr. Caryl Comes in clinic on July 20, and it given high A. fib burden noted on recent Holter monitoring, it was determined that he would benefit from Tikosyn loading and repeat cardioversion.  Hospital Course  Patient presented to [facility] on 7/20 for admission.  K and Mg were stable and based on creatinine clearance, he was placed on Tikosyn 500 mcg q 12h.  following his first dose of 57, his QTC widened to 503 ms. His dose was reduced from 500 g to 375 pg every 12 hours. On that dose, his QTC remained stable though he remained in atrial fibrillation. Due to hypomagnesemia, his oral magnesium supplementation dose was increased.  On 7/21, he  underwent successful DCCV with restoration of sinus rhythm.  Post-DCCV, he has remained in sinus rhythm and QTc remains stable.  He will be discharged home today in good condition with f/u arranged in Afib clinic for 8/1.  Discharge Vitals Blood pressure 121/53, pulse 63, temperature 98.6 F (37 C), temperature source Oral, resp. rate 18, height 5\' 10"  (1.778 m), weight 233 lb (105.688 kg), SpO2 93 %.  Filed Weights   05/05/15 0459 05/06/15 0531 05/07/15 0536  Weight: 233 lb (105.688 kg) 231 lb 14.4 oz (105.189 kg) 233 lb (105.688 kg)    Labs  Basic Metabolic Panel  Recent Labs  05/06/15 0301 05/06/15 1040 05/07/15 0432  NA 137 136 135  K 4.8 4.6 4.6  CL 100* 101 98*  CO2 27 27 29   GLUCOSE 157* 195* 166*  BUN 16 16 17   CREATININE 0.99 1.02 0.97  CALCIUM 9.1 9.1 9.0  MG 1.8  --  1.9   Disposition  Pt is being discharged home today in good condition.  Follow-up Plans & Appointments  Follow-up Information    Follow up with [facility] On 05/16/2015.   Why:  at [facility] for labs and EKG   Contact information:   [place] [phone]      Follow up with [facility] On 06/06/2015.   Why:  at 9:30AM   Contact information:   [place] [phone]      Follow up with Virl Axe, MD On 08/08/2015.   Specialty:  Cardiology   Why:  at 9:30AM   Contact information:   [address] [address] [phone]       Discharge Medications    Medication List    TAKE these medications        apixaban 5 MG Tabs tablet  Commonly known as:  ELIQUIS  Take 1 tablet (5 mg total) by mouth 2 (two) times daily.     docusate sodium 100 MG capsule  Commonly known as:  COLACE  Take 1 capsule (100 mg total) by mouth 2 (two) times daily.     dofetilide 125 MCG capsule  Commonly known as:  TIKOSYN  Take 3 capsules (375 mcg total) by mouth 2 (two) times  daily.     Fish Oil 1000 MG Caps  Take 1,000 mg by mouth daily.     furosemide 20 MG tablet  Commonly known as:  LASIX  Take 20 mg by mouth daily.     gemfibrozil 600 MG tablet  Commonly known as:  LOPID  Take 600 mg by mouth at bedtime.     glimepiride 2 MG tablet  Commonly known as:  AMARYL  Take 2 mg by mouth 2 (two) times daily.     latanoprost 0.005 % ophthalmic solution  Commonly known as:  XALATAN  Place 1 drop into both eyes at bedtime.     lisinopril 20 MG tablet  Commonly known as:  PRINIVIL,ZESTRIL  Take 1 tablet (20 mg total) by mouth daily.     magnesium oxide 400 MG tablet  Commonly known as:  MAG-OX  Take 1 tablet (400 mg total) by mouth 2 (two) times daily.     metFORMIN 500 MG tablet  Commonly known as:  GLUCOPHAGE  Take 1,000 mg by mouth 2 (two) times daily.     metoprolol succinate 25 MG 24 hr tablet  Commonly known as:  TOPROL XL  Take 1 tablet (25 mg total) by mouth daily.     sodium chloride 0.65 % Soln nasal spray  Commonly known as:  OCEAN  Place 1 spray into both nostrils at bedtime.       Outstanding Labs/Studies  Mg, BMET, and ECG on 8/1.  Duration of Discharge Encounter   Greater than 30 minutes including physician time.  Signed, Murray Hodgkins NP 05/07/2015, 10:49 AM    EP Attending  Patient seen and examined. Agree with above exam, assessment and plan. [place] for discharge home with followup as outlined above.  Mikle Bosworth.D.

## 2015-05-11 ENCOUNTER — Encounter: Payer: Self-pay | Admitting: Cardiovascular Disease

## 2015-05-11 ENCOUNTER — Ambulatory Visit (INDEPENDENT_AMBULATORY_CARE_PROVIDER_SITE_OTHER): Payer: Medicare Other | Admitting: Cardiovascular Disease

## 2015-05-11 VITALS — BP 132/98 | HR 80 | Ht 70.5 in | Wt 234.5 lb

## 2015-05-11 DIAGNOSIS — E785 Hyperlipidemia, unspecified: Secondary | ICD-10-CM

## 2015-05-11 DIAGNOSIS — I10 Essential (primary) hypertension: Secondary | ICD-10-CM | POA: Diagnosis not present

## 2015-05-11 DIAGNOSIS — G4733 Obstructive sleep apnea (adult) (pediatric): Secondary | ICD-10-CM | POA: Diagnosis not present

## 2015-05-11 DIAGNOSIS — I429 Cardiomyopathy, unspecified: Secondary | ICD-10-CM

## 2015-05-11 DIAGNOSIS — I481 Persistent atrial fibrillation: Secondary | ICD-10-CM | POA: Diagnosis not present

## 2015-05-11 DIAGNOSIS — I4819 Other persistent atrial fibrillation: Secondary | ICD-10-CM

## 2015-05-11 DIAGNOSIS — I420 Dilated cardiomyopathy: Secondary | ICD-10-CM

## 2015-05-11 MED ORDER — CARVEDILOL 6.25 MG PO TABS: 6.2500 mg | ORAL_TABLET | Freq: Two times a day (BID) | ORAL | Status: DC

## 2015-05-11 NOTE — Assessment & Plan Note (Signed)
History of nonischemic myopathy with recent cardiac catheterization performed by myself 03/17/15 revealing minimal CAD with EF of 25-35%.

## 2015-05-11 NOTE — Assessment & Plan Note (Signed)
History of hypertension with blood pressure measured today at 132/98. He is on metoprolol and lisinopril. Because of his significant LV dysfunction I'm going to transition him from metoprolol to carvedilol and will titrate him as an outpatient up to the maximum tolerated dose

## 2015-05-11 NOTE — Progress Notes (Signed)
05/11/2015 Lucas Dudley   05-20-1941  [id number]  Primary Physician Robert Bellow, MD Primary Cardiologist: Lorretta Harp MD [name]   HPI:  The patient is a 74 year old, mild to mildly overweight, married Caucasian male, father of 20, grandfather to 2 grandchildren who I last saw in the office  03/09/15.Lucas Dudley He is retired from [employer]. His risk factors include hypertension, hyperlipidemia, as well as diabetes and remote tobacco abuse having quit 25 years ago. His mother did die of an MI at age 76. He has never had a heart attack or stroke. His last stress test was performed approximately 3 years ago was nonischemic. Since I saw him 6 months ago he developed dyspnea on exertion and some orthopnea several weeks ago which has ultimately improved with the addition of an oral diuretic. He does get occasional chest tightness. Recent Myoview stress test performed 02/09/15 showed inferior scar with moderate peri-infarct ischemia and a 2-D echo showed an EF of 40% which represents a decline from 50-55% by echo back in 2010. An event Monitor showed sick sinus syndrome/PAF with pauses up to 2.5 seconds.. He underwent outpatient diagnostic coronary arteriography via the right radial approach by myself on 03/17/15 revealing minimal CAD with an EF of 25-35%. He was recently admitted for tachycardia slowed and he was shocked successfully to sinus rhythm which held briefly. He currently is in A. Fib with a ventricular response of 100.   Current Outpatient Prescriptions  Medication Sig Dispense Refill  . apixaban (ELIQUIS) 5 MG TABS tablet Take 1 tablet (5 mg total) by mouth 2 (two) times daily. 180 tablet 3  . docusate sodium (COLACE) 100 MG capsule Take 1 capsule (100 mg total) by mouth 2 (two) times daily. (Patient taking differently: Take 100 mg by mouth daily. )    . dofetilide (TIKOSYN) 125 MCG capsule Take 3 capsules (375 mcg total) by mouth 2 (two) times daily. 180 capsule 6  .  furosemide (LASIX) 20 MG tablet Take 20 mg by mouth daily.    Lucas Dudley gemfibrozil (LOPID) 600 MG tablet Take 600 mg by mouth at bedtime.     Lucas Dudley glimepiride (AMARYL) 2 MG tablet Take 2 mg by mouth 2 (two) times daily.     Lucas Dudley latanoprost (XALATAN) 0.005 % ophthalmic solution Place 1 drop into both eyes at bedtime.    Lucas Dudley lisinopril (PRINIVIL,ZESTRIL) 20 MG tablet Take 1 tablet (20 mg total) by mouth daily. (Patient taking differently: Take 20 mg by mouth at bedtime. ) 60 tablet 3  . magnesium oxide (MAG-OX) 400 MG tablet Take 1 tablet (400 mg total) by mouth 2 (two) times daily. 60 tablet 6  . metFORMIN (GLUCOPHAGE) 500 MG tablet Take 1,000 mg by mouth 2 (two) times daily.    . metoprolol succinate (TOPROL XL) 25 MG 24 hr tablet Take 1 tablet (25 mg total) by mouth daily. 90 tablet 3  . Omega-3 Fatty Acids (FISH OIL) 1000 MG CAPS Take 1,000 mg by mouth daily.     . sodium chloride (OCEAN) 0.65 % SOLN nasal spray Place 1 spray into both nostrils at bedtime.     No current facility-administered medications for this visit.    Allergies  Allergen Reactions  . Tape Rash and Other (See Comments)    Causes skin redness, Use paper tape only.    History   Social History  . Marital Status: Married    Spouse Name: N/A  . Number of Children: N/A  . Years of  Education: N/A   Occupational History  . Retired    Social History Main Topics  . Smoking status: Former Smoker -- 2.00 packs/day for 27 years    Types: Cigarettes    Quit date: 10/31/1983  . Smokeless tobacco: Never Used  . Alcohol Use: Yes     Comment: "used to drink; stopped ~ 2008"  . Drug Use: No  . Sexual Activity: Not Currently   Other Topics Concern  . Not on file   Social History Narrative   Lives in [place], [place] with wife.     Review of Systems: General: negative for chills, fever, night sweats or weight changes.  Cardiovascular: negative for chest pain, dyspnea on exertion, edema, orthopnea, palpitations, paroxysmal nocturnal  dyspnea or shortness of breath Dermatological: negative for rash Respiratory: negative for cough or wheezing Urologic: negative for hematuria Abdominal: negative for nausea, vomiting, diarrhea, bright red blood per rectum, melena, or hematemesis Neurologic: negative for visual changes, syncope, or dizziness All other systems reviewed and are otherwise negative except as noted above.    Blood pressure 132/98, pulse 80, height 5' 10.5" (1.791 m), weight 234 lb 8 oz (106.369 kg).  General appearance: alert and no distress Neck: no adenopathy, no carotid bruit, no JVD, supple, symmetrical, trachea midline and thyroid not enlarged, symmetric, no tenderness/mass/nodules Lungs: clear to auscultation bilaterally Heart: irregularly irregular rhythm Extremities: extremities normal, atraumatic, no cyanosis or edema  EKG A. Fib with ventricular response of 102 and nonspecific ST and T-wave changes. A 70 7 right bundle branch block with left axis deviation. I personally reviewed this EKG  ASSESSMENT AND PLAN:   Severe obstructive sleep apnea Scheduled to see Dr. Claiborne Billings for evaluation and outpatient sleep study  Persistent atrial fibrillation The patient is currently on an oral anticoagulant. He has A. Fib with mildly elevated ventricular response on metoprolol. He recently was admitted for Tikosyn load and had DC cardioversion which was transiently successful. He is currently in A. Fib. He says he feels better in sinus rhythm. He scheduled to see Roderic Palau in the A. Fib clinic on Monday. He may be a candidate for A. Fib ablation.  Hyperlipidemia History of hyperlipidemia on Lopid.  Essential hypertension History of hypertension with blood pressure measured today at 132/98. He is on metoprolol and lisinopril. Because of his significant LV dysfunction I'm going to transition him from metoprolol to carvedilol and will titrate him as an outpatient up to the maximum tolerated dose  Cardiomyopathy,  dilated, nonischemic History of nonischemic myopathy with recent cardiac catheterization performed by myself 03/17/15 revealing minimal CAD with EF of 25-35%.      Lorretta Harp MD FACP,FACC,FAHA, [facility] 05/11/2015 3:53 PM

## 2015-05-11 NOTE — Assessment & Plan Note (Signed)
History of hyperlipidemia on Lopid.

## 2015-05-11 NOTE — Assessment & Plan Note (Signed)
The patient is currently on an oral anticoagulant. He has A. Fib with mildly elevated ventricular response on metoprolol. He recently was admitted for Tikosyn load and had DC cardioversion which was transiently successful. He is currently in A. Fib. He says he feels better in sinus rhythm. He scheduled to see Roderic Palau in the A. Fib clinic on Monday. He may be a candidate for A. Fib ablation.

## 2015-05-11 NOTE — Assessment & Plan Note (Signed)
Scheduled to see Dr. Claiborne Billings for evaluation and outpatient sleep study

## 2015-05-11 NOTE — Patient Instructions (Signed)
  We will see you back in follow up in 2 weeks with Lucas Dudley for medication titration, an extender in 3 months and Dr Gwenlyn Found in 6 months.   Dr Gwenlyn Found has ordered: 1. Stop metoprolol  2. Start Coreg 6.25mg  twice a day

## 2015-05-16 ENCOUNTER — Encounter (HOSPITAL_COMMUNITY): Payer: PRIVATE HEALTH INSURANCE | Admitting: Nurse Practitioner

## 2015-05-16 ENCOUNTER — Encounter (HOSPITAL_COMMUNITY): Payer: Self-pay | Admitting: Nurse Practitioner

## 2015-05-16 ENCOUNTER — Ambulatory Visit (HOSPITAL_COMMUNITY)
Admission: RE | Admit: 2015-05-16 | Discharge: 2015-05-16 | Disposition: A | Discharge status: Home / Self Care | Payer: Medicare Other | Source: Ambulatory Visit | Attending: Nurse Practitioner | Admitting: Nurse Practitioner

## 2015-05-16 VITALS — BP 116/68 | HR 59 | Ht 70.0 in | Wt 235.0 lb

## 2015-05-16 DIAGNOSIS — I2510 Atherosclerotic heart disease of native coronary artery without angina pectoris: Secondary | ICD-10-CM | POA: Diagnosis not present

## 2015-05-16 DIAGNOSIS — I519 Heart disease, unspecified: Secondary | ICD-10-CM | POA: Diagnosis not present

## 2015-05-16 DIAGNOSIS — I481 Persistent atrial fibrillation: Secondary | ICD-10-CM | POA: Insufficient documentation

## 2015-05-16 DIAGNOSIS — Z87891 Personal history of nicotine dependence: Secondary | ICD-10-CM | POA: Insufficient documentation

## 2015-05-16 DIAGNOSIS — Z8546 Personal history of malignant neoplasm of prostate: Secondary | ICD-10-CM | POA: Diagnosis not present

## 2015-05-16 DIAGNOSIS — E119 Type 2 diabetes mellitus without complications: Secondary | ICD-10-CM | POA: Diagnosis not present

## 2015-05-16 DIAGNOSIS — Z7902 Long term (current) use of antithrombotics/antiplatelets: Secondary | ICD-10-CM | POA: Insufficient documentation

## 2015-05-16 DIAGNOSIS — G8929 Other chronic pain: Secondary | ICD-10-CM | POA: Diagnosis not present

## 2015-05-16 DIAGNOSIS — Z8249 Family history of ischemic heart disease and other diseases of the circulatory system: Secondary | ICD-10-CM | POA: Insufficient documentation

## 2015-05-16 DIAGNOSIS — Z79899 Other long term (current) drug therapy: Secondary | ICD-10-CM | POA: Diagnosis not present

## 2015-05-16 DIAGNOSIS — I10 Essential (primary) hypertension: Secondary | ICD-10-CM | POA: Insufficient documentation

## 2015-05-16 DIAGNOSIS — M545 Low back pain: Secondary | ICD-10-CM | POA: Insufficient documentation

## 2015-05-16 DIAGNOSIS — G4733 Obstructive sleep apnea (adult) (pediatric): Secondary | ICD-10-CM | POA: Insufficient documentation

## 2015-05-16 DIAGNOSIS — I429 Cardiomyopathy, unspecified: Secondary | ICD-10-CM | POA: Insufficient documentation

## 2015-05-16 DIAGNOSIS — E785 Hyperlipidemia, unspecified: Secondary | ICD-10-CM | POA: Insufficient documentation

## 2015-05-16 DIAGNOSIS — Z833 Family history of diabetes mellitus: Secondary | ICD-10-CM | POA: Diagnosis not present

## 2015-05-16 DIAGNOSIS — Z85820 Personal history of malignant melanoma of skin: Secondary | ICD-10-CM | POA: Diagnosis not present

## 2015-05-16 DIAGNOSIS — I4819 Other persistent atrial fibrillation: Secondary | ICD-10-CM

## 2015-05-16 LAB — BASIC METABOLIC PANEL
Anion gap: 6 (ref 5–15)
BUN: 27 mg/dL — ABNORMAL HIGH (ref 6–20)
CALCIUM: 9.5 mg/dL (ref 8.9–10.3)
CHLORIDE: 100 mmol/L — AB (ref 101–111)
CO2: 31 mmol/L (ref 22–32)
CREATININE: 1.06 mg/dL (ref 0.61–1.24)
GFR calc Af Amer: >60 mL/min (ref >60)
GFR calc non Af Amer: >60 mL/min (ref >60)
GLUCOSE: 212 mg/dL — AB (ref 65–99)
Potassium: 4.9 mmol/L (ref 3.5–5.1)
SODIUM: 137 mmol/L (ref 135–145)

## 2015-05-16 LAB — MAGNESIUM: MAGNESIUM: 1.7 mg/dL (ref 1.7–2.4)

## 2015-05-16 NOTE — Progress Notes (Signed)
Patient ID: Lucas Dudley, male   DOB: 21-Nov-1940, 74 y.o.   MRN: [id number]     Primary Care Physician: Robert Bellow, MD Referring Physician:[facility] F/U   Lucas Dudley is a 74 y.o. male with a h/o persistent afib that was in the hospital, 7/20-7/23, for tikosyn loading. Pt did have prolonged QTc and dose was reduced to 125 mcg q 12 hours. Qtc remained stable on that dose. QTc today by EKG at 443 ms, and SR. He did see Dr. Alvester Chou after d/c from hospital and he was in afib. He had eaten a big plate of chinese food the night before. He does feel improved energy and less dyspnea with exertion. Metoprolol was changed to carvedilol per Dr. Gwenlyn Found. He has no complaints today.  Today, he denies symptoms of palpitations, chest pain, shortness of breath, orthopnea, PND, lower extremity edema, dizziness, presyncope, syncope, or neurologic sequela. The patient is tolerating medications without difficulties and is otherwise without complaint today.   Past Medical History  Diagnosis Date  . Hypertension   . Glaucoma   . Hyperlipidemia   . OSA on CPAP 2012  . Chronic constipation   . Paroxysmal atrial fibrillation     a. 04/2015 Tikosyn loading, DCCV x 1;  b. CHA2DS2VASc = 5-->Eliquis.  . Abnormal nuclear stress test   . Chronic anticoagulation     Eliquis  . CAD (coronary artery disease)     a. Cath 03/17/15 showing 100% ostial D1, 50% prox LAD to mid LAD, 40% RPDA stenosis. Med rx.  [name] NICM (nonischemic cardiomyopathy)   . Kidney stones     "found during prostate OR; still in there as far as I know" (05/04/2015)  . Prostate cancer   . 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 Heart murmur     "all my life"  . Myocardial infarct     "I was told 5-10 yr ago I'd had a MI in my 20's; now they say I didn't" (05/04/2015)  . Childhood asthma   . Pneumonia 1950's X 1  . Chronic bronchitis     "had it several times til I quit smoking"  . Type II diabetes mellitus   . Arthritis     "hands  probably" (05/04/2015)  . Chronic lower back pain   . History of gout    Past Surgical History  Procedure Laterality Date  . Shoulder open rotator cuff repair Right X 2  . Nm myocar perf wall motion  02/21/2012    EF 61% ,EXERCISE 7 METS. exercise stopped due to wheezing and shortness of breathe  . Doppler echocardiography  05/25/2009    EF 50-55%,LA mildly dilated, LV function normal  . Carotid doppler  09/17/2008    rigt and left ICAs 0-49%;mildly  abnormal  . Cardiac catheterization N/A 03/17/2015    Procedure: Left Heart Cath and Coronary Angiography;  Surgeon: Lorretta Harp, MD;  Location: [facility];  Service: Cardiovascular;  Laterality: N/A;  . Prostatectomy    . Electrophysiologic study N/A 04/05/2015    Procedure: Cardioversion;  Surgeon: Sanda Klein, MD;  Location: [facility];  Service: Cardiovascular;  Laterality: N/A;  . Laparoscopic cholecystectomy    . Hernia repair    . Abdominal hernia repair      w/mesh  . Excisional hemorrhoidectomy      "inside and out"  . Fine needle aspiration Right     knee; "drew ~ 1  quart off"  . Melanoma excision Right     "neck"    Current Outpatient Prescriptions  Medication Sig Dispense Refill  . apixaban (ELIQUIS) 5 MG TABS tablet Take 1 tablet (5 mg total) by mouth 2 (two) times daily. 180 tablet 3  . carvedilol (COREG) 6.25 MG tablet Take 1 tablet (6.25 mg total) by mouth 2 (two) times daily. 60 tablet 6  . docusate sodium (COLACE) 100 MG capsule Take 1 capsule (100 mg total) by mouth 2 (two) times daily. (Patient taking differently: Take 100 mg by mouth daily. )    . dofetilide (TIKOSYN) 125 MCG capsule Take 3 capsules (375 mcg total) by mouth 2 (two) times daily. 180 capsule 6  . furosemide (LASIX) 20 MG tablet Take 20 mg by mouth daily.    [name] gemfibrozil (LOPID) 600 MG tablet Take 600 mg by mouth at bedtime.     [name] glimepiride (AMARYL) 2 MG tablet Take 2 mg by mouth 2 (two) times daily.     [name] latanoprost  (XALATAN) 0.005 % ophthalmic solution Place 1 drop into both eyes at bedtime.    [name] lisinopril (PRINIVIL,ZESTRIL) 20 MG tablet Take 1 tablet (20 mg total) by mouth daily. (Patient taking differently: Take 20 mg by mouth at bedtime. ) 60 tablet 3  . magnesium oxide (MAG-OX) 400 MG tablet Take 1 tablet (400 mg total) by mouth 2 (two) times daily. 60 tablet 6  . metFORMIN (GLUCOPHAGE) 500 MG tablet Take 1,000 mg by mouth 2 (two) times daily.    . Omega-3 Fatty Acids (FISH OIL) 1000 MG CAPS Take 1,000 mg by mouth daily.     . sodium chloride (OCEAN) 0.65 % SOLN nasal spray Place 1 spray into both nostrils at bedtime.     No current facility-administered medications for this encounter.    Allergies  Allergen Reactions  . Tape Rash and Other (See Comments)    Causes skin redness, Use paper tape only.    History   Social History  . Marital Status: Married    Spouse Name: N/A  . Number of Children: N/A  . Years of Education: N/A   Occupational History  . Retired    Social History Main Topics  . Smoking status: Former Smoker -- 2.00 packs/day for 27 years    Types: Cigarettes    Quit date: 10/31/1983  . Smokeless tobacco: Never Used  . Alcohol Use: Yes     Comment: "used to drink; stopped ~ 2008"  . Drug Use: No  . Sexual Activity: Not Currently   Other Topics Concern  . Not on file   Social History Narrative   Lives in [place], [place] with wife.    Family History  Problem Relation Age of Onset  . Heart disease Mother   . Lung cancer Mother   . Healthy Daughter   . Diabetes Father   . Heart disease Father   . Stroke Brother   . Heart attack Mother 12    ROS- All systems are reviewed and negative except as per the HPI above  Physical Exam: Filed Vitals:   05/16/15 1334  BP: 116/68  Pulse: 59  Height: 5\' 10"  (1.778 m)  Weight: 235 lb (106.595 kg)    GEN- The patient is well appearing, alert and oriented x 3 today.   Head- normocephalic, atraumatic Eyes-  Sclera  clear, conjunctiva pink Ears- hearing intact Oropharynx- clear Neck- supple, no JVP Lymph- no cervical lymphadenopathy Lungs- Clear to ausculation bilaterally, normal work  of breathing Heart- Regular rate and rhythm, no murmurs, rubs or gallops, PMI not laterally displaced GI- soft, NT, ND, + BS Extremities- no clubbing, cyanosis, or edema MS- no significant deformity or atrophy Skin- no rash or lesion Psych- euthymic mood, full affect Neuro- strength and sensation are intact  EKG-EKG sinus brady with first degree av block at 59 bpm, IRBBB, LAFB, PR 232 ms, QRS, 106 bpm, QTc ms, 443 ms. Epic records reviewed.   Assessment and Plan: 1. Persistent AFIB Maintaining SR with tikosyn Bmet/mag today QTc acceptable  2. LV dysfunction Continue carvedilol/lisinopril May benefit from repeat Echo after maintaining SR for several months to see if any improvement   He will f/u with Kristen pharmD as scheduled 9/11.  Afib clinic 8/22 Dr. Caryl Comes as scheduled 10/24

## 2015-05-17 ENCOUNTER — Other Ambulatory Visit (HOSPITAL_COMMUNITY): Payer: Self-pay | Admitting: *Deleted

## 2015-05-17 ENCOUNTER — Telehealth (HOSPITAL_COMMUNITY): Payer: Self-pay | Admitting: Nurse Practitioner

## 2015-05-17 DIAGNOSIS — I4819 Other persistent atrial fibrillation: Secondary | ICD-10-CM

## 2015-05-17 NOTE — Telephone Encounter (Signed)
Pt called back re low magnesium. He states he has a capsule of magnesium and he has noticed that often times it comes thru in the stool still intact. He went to the drugstore, and got a  magnesium tablet. He is going to start using this. He follows up with Cyril Mourning PharmD [facility] in 10 days and can have mag rechecked at that time.

## 2015-05-26 ENCOUNTER — Encounter: Payer: Self-pay | Admitting: Pharmacist Clinician (PhC)/ Clinical Pharmacy Specialist

## 2015-05-26 ENCOUNTER — Other Ambulatory Visit: Payer: Self-pay | Admitting: Cardiovascular Disease

## 2015-05-26 ENCOUNTER — Ambulatory Visit (INDEPENDENT_AMBULATORY_CARE_PROVIDER_SITE_OTHER): Payer: Medicare Other | Admitting: Pharmacist Clinician (PhC)/ Clinical Pharmacy Specialist

## 2015-05-26 VITALS — BP 112/62 | HR 66 | Ht 70.5 in | Wt 236.0 lb

## 2015-05-26 DIAGNOSIS — I420 Dilated cardiomyopathy: Secondary | ICD-10-CM

## 2015-05-26 DIAGNOSIS — I429 Cardiomyopathy, unspecified: Secondary | ICD-10-CM

## 2015-05-26 DIAGNOSIS — I4891 Unspecified atrial fibrillation: Secondary | ICD-10-CM | POA: Diagnosis not present

## 2015-05-26 DIAGNOSIS — I4819 Other persistent atrial fibrillation: Secondary | ICD-10-CM

## 2015-05-26 DIAGNOSIS — I481 Persistent atrial fibrillation: Secondary | ICD-10-CM

## 2015-05-26 LAB — MAGNESIUM: Magnesium: 1.8 mg/dL (ref 1.5–2.5)

## 2015-05-26 MED ORDER — CARVEDILOL 12.5 MG PO TABS: 12.5000 mg | ORAL_TABLET | Freq: Two times a day (BID) | ORAL | Status: DC

## 2015-05-26 NOTE — Progress Notes (Signed)
05/26/2015 Lucas Dudley 04-Jul-1941 [id number]   HPI:  Lucas Dudley is a 74 y.o. male patient of Dr Gwenlyn Found, who presents today for medication titration.  He has systolic HF with an EF of 25-35%.  He was recently started on TIkosyn and is doing well with it.  When he saw Dr. Gwenlyn Found on July 27, he was switched from metoprolol to carvedilol.  Today he is here for dose titration.  He has had no problems with the transition and is feeling well.  He has no SOB, edema, nausea, fatigue or noticeable change in HR.  His weight is consistent with last office visit.  He was seen in the A Fib clinic last week and noted to Lucas Dudley that he was passing full magnesium capsules in his stool.  His magnesium level was low, so she switched him to tablets.  It's possible that he was absorbing the magnesium and the capsules were filling with liquid waste from the body.  She asked that we send him to the lab today for a repeat magnesium level, as he did just start the Tikosyn   Current Outpatient Prescriptions  Medication Sig Dispense Refill  . apixaban (ELIQUIS) 5 MG TABS tablet Take 1 tablet (5 mg total) by mouth 2 (two) times daily. 180 tablet 3  . carvedilol (COREG) 12.5 MG tablet Take 1 tablet (12.5 mg total) by mouth 2 (two) times daily. 60 tablet 1  . docusate sodium (COLACE) 100 MG capsule Take 1 capsule (100 mg total) by mouth 2 (two) times daily. (Patient taking differently: Take 100 mg by mouth daily. )    . dofetilide (TIKOSYN) 125 MCG capsule Take 3 capsules (375 mcg total) by mouth 2 (two) times daily. 180 capsule 6  . furosemide (LASIX) 20 MG tablet Take 20 mg by mouth daily.    [name] gemfibrozil (LOPID) 600 MG tablet Take 600 mg by mouth at bedtime.     [name] glimepiride (AMARYL) 2 MG tablet Take 2 mg by mouth 2 (two) times daily.     [name] latanoprost (XALATAN) 0.005 % ophthalmic solution Place 1 drop into both eyes at bedtime.    [name] lisinopril (PRINIVIL,ZESTRIL) 20 MG tablet Take 1 tablet (20 mg total) by mouth  daily. (Patient taking differently: Take 20 mg by mouth at bedtime. ) 60 tablet 3  . magnesium oxide (MAG-OX) 400 MG tablet Take 1 tablet (400 mg total) by mouth 2 (two) times daily. 60 tablet 6  . metFORMIN (GLUCOPHAGE) 500 MG tablet Take 1,000 mg by mouth 2 (two) times daily.    . Omega-3 Fatty Acids (FISH OIL) 1000 MG CAPS Take 1,000 mg by mouth daily.     . sodium chloride (OCEAN) 0.65 % SOLN nasal spray Place 1 spray into both nostrils at bedtime.     No current facility-administered medications for this visit.    Allergies  Allergen Reactions  . Tape Rash and Other (See Comments)    Causes skin redness, Use paper tape only.    Past Medical History  Diagnosis Date  . Hypertension   . Glaucoma   . Hyperlipidemia   . OSA on CPAP 2012  . Chronic constipation   . Paroxysmal atrial fibrillation     a. 04/2015 Tikosyn loading, DCCV x 1;  b. CHA2DS2VASc = 5-->Eliquis.  . Abnormal nuclear stress test   . Chronic anticoagulation     Eliquis  . CAD (coronary artery disease)     a. Cath 03/17/15 showing 100% ostial D1,  50% prox LAD to mid LAD, 40% RPDA stenosis. Med rx.  [name] NICM (nonischemic cardiomyopathy)   . Kidney stones     "found during prostate OR; still in there as far as I know" (05/04/2015)  . Prostate cancer   . 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 Heart murmur     "all my life"  . Myocardial infarct     "I was told 5-10 yr ago I'd had a MI in my 20's; now they say I didn't" (05/04/2015)  . Childhood asthma   . Pneumonia 1950's X 1  . Chronic bronchitis     "had it several times til I quit smoking"  . Type II diabetes mellitus   . Arthritis     "hands probably" (05/04/2015)  . Chronic lower back pain   . History of gout     Blood pressure 112/62, pulse 66, height 5' 10.5" (1.791 m), weight 236 lb (107.049 kg).   ASSESSMENT AND PLAN:  [name] PharmD CPP [facility]

## 2015-05-26 NOTE — Patient Instructions (Signed)
Increase the carvedilol to 12.5 mg twice daily - take 2 tablets of your 6.25 mg carvedilol twice daily until it it gone  Get your magnesium level checked today.  Follow up with Roderic Palau on Aug 22

## 2015-05-26 NOTE — Assessment & Plan Note (Signed)
Today his BP and heart rate look good.  He is feeling well, so I will increase his carvedilol to 12.5 mg twice daily.  He has another appointment in the AF clinic in about 3 weeks.  I will have them increase his carvedilol to 25 mg twice daily, should he still be doing well.  I have advised him that if that appointment is cancelled, he will need to call me and we will see him again.  He understands the importance of getting this to the maximum tolerated dose.    Will also send him to the lab today for a repeat magnesium level.

## 2015-05-30 ENCOUNTER — Telehealth: Payer: Self-pay | Admitting: Pharmacist

## 2015-05-31 NOTE — Telephone Encounter (Signed)
Received phone call from patient with report that he's had more swelling in his feet since the dose of his Coreg was increased a few days ago. He also reports that he's up 4 lbs. I advised him to take an extra Lasix pill for the next few days to see if that helps to bring the swelling back down and routed the message to [name], PharmD who recently saw him in afib clinic for dose titration of beta blocker. She will contact him later this week for f/u.

## 2015-06-03 ENCOUNTER — Telehealth (HOSPITAL_COMMUNITY): Payer: Self-pay | Admitting: *Deleted

## 2015-06-03 ENCOUNTER — Telehealth: Payer: Self-pay | Admitting: Cardiovascular Disease

## 2015-06-03 NOTE — Telephone Encounter (Signed)
Patient c/o intermittent shortness of breath at rest.  When he gets up and walks around he is okay.  The symptom of shortness of breath started after doubling up on Coreg.  Weight today is 235 LB  Weight OV 8/11 236  Blood pressure and Pulse unknown, he doesn't feel his device is accurate.  No increase with night time elevation of head to sleep  Will route to [place] and Dr. Gwenlyn Found since he has had medication changes with Coreg and has been recently started on Tikosyn

## 2015-06-03 NOTE — Telephone Encounter (Signed)
Patient complains of SOB and fatigue with increase in dose of carvedilol.  States comes and goes, does not matter if he is sitting or active.   I asked that he decrease dose back to 12.5 mg bid.  I will contact him in another week to see if symptoms have resolved.  He has an appointment in the AF clinic on Monday, I advised him to let them know how he is feeling.

## 2015-06-03 NOTE — Telephone Encounter (Signed)
Pt called in stating he has been short of breath ever since increasing coreg. States taking extra lasix had no effect on his shortness of breath but he did report his weight normalizing after taking the extra dose.  Discussed with Roderic Palau, NP and since the coreg was being titrated by pharmacy at [facility] she encouraged patient to call and discuss issue with them.  Patient verbalized understanding and will call over to [facility] and speak with pharmacist in the office.

## 2015-06-03 NOTE — Telephone Encounter (Signed)
New message      Pt c/o medication issue:  1. Name of Medication: coreg  2. How are you currently taking this medication (dosage and times per day)? 12.5 mg bid 3. Are you having a reaction (difficulty breathing--STAT)? no 4. What is your medication issue?  Pt states 2-3 days after starting new dosage he get sob.  It is not all of the time.  He is not sob now.  Could it be from the medication?

## 2015-06-06 ENCOUNTER — Encounter (HOSPITAL_COMMUNITY): Payer: PRIVATE HEALTH INSURANCE | Admitting: Nurse Practitioner

## 2015-06-06 ENCOUNTER — Ambulatory Visit (HOSPITAL_COMMUNITY)
Admission: RE | Admit: 2015-06-06 | Discharge: 2015-06-06 | Disposition: A | Discharge status: Home / Self Care | Payer: Medicare Other | Source: Ambulatory Visit | Attending: Nurse Practitioner | Admitting: Nurse Practitioner

## 2015-06-06 VITALS — BP 128/80 | HR 102 | Ht 70.0 in | Wt 236.2 lb

## 2015-06-06 DIAGNOSIS — Z833 Family history of diabetes mellitus: Secondary | ICD-10-CM | POA: Insufficient documentation

## 2015-06-06 DIAGNOSIS — Z85820 Personal history of malignant melanoma of skin: Secondary | ICD-10-CM | POA: Insufficient documentation

## 2015-06-06 DIAGNOSIS — I519 Heart disease, unspecified: Secondary | ICD-10-CM | POA: Insufficient documentation

## 2015-06-06 DIAGNOSIS — I481 Persistent atrial fibrillation: Secondary | ICD-10-CM

## 2015-06-06 DIAGNOSIS — I10 Essential (primary) hypertension: Secondary | ICD-10-CM | POA: Diagnosis not present

## 2015-06-06 DIAGNOSIS — Z79899 Other long term (current) drug therapy: Secondary | ICD-10-CM | POA: Insufficient documentation

## 2015-06-06 DIAGNOSIS — I429 Cardiomyopathy, unspecified: Secondary | ICD-10-CM | POA: Insufficient documentation

## 2015-06-06 DIAGNOSIS — E785 Hyperlipidemia, unspecified: Secondary | ICD-10-CM | POA: Diagnosis not present

## 2015-06-06 DIAGNOSIS — I2510 Atherosclerotic heart disease of native coronary artery without angina pectoris: Secondary | ICD-10-CM | POA: Insufficient documentation

## 2015-06-06 DIAGNOSIS — Z87891 Personal history of nicotine dependence: Secondary | ICD-10-CM | POA: Insufficient documentation

## 2015-06-06 DIAGNOSIS — Z8546 Personal history of malignant neoplasm of prostate: Secondary | ICD-10-CM | POA: Diagnosis not present

## 2015-06-06 DIAGNOSIS — E119 Type 2 diabetes mellitus without complications: Secondary | ICD-10-CM | POA: Insufficient documentation

## 2015-06-06 DIAGNOSIS — I4819 Other persistent atrial fibrillation: Secondary | ICD-10-CM

## 2015-06-06 DIAGNOSIS — Z8249 Family history of ischemic heart disease and other diseases of the circulatory system: Secondary | ICD-10-CM | POA: Insufficient documentation

## 2015-06-06 DIAGNOSIS — G4733 Obstructive sleep apnea (adult) (pediatric): Secondary | ICD-10-CM | POA: Insufficient documentation

## 2015-06-06 DIAGNOSIS — Z7902 Long term (current) use of antithrombotics/antiplatelets: Secondary | ICD-10-CM | POA: Insufficient documentation

## 2015-06-07 ENCOUNTER — Encounter (HOSPITAL_COMMUNITY): Payer: Self-pay | Admitting: Nurse Practitioner

## 2015-06-07 NOTE — Progress Notes (Signed)
Patient ID: Lucas Dudley, male   DOB: February 02, 1941, 74 y.o.   MRN: [id number]     Primary Care Physician: Lucas Bellow, MD Referring Physician: Dr. Verlene Dudley is a 74 y.o. male with a h/o persistent afib that was in the hospital, 7/20-7/23, for tikosyn loading. Pt did have prolonged QTc and dose was reduced to 125 mcg q 12 hours. Qtc remained stable on that dose. QTc today by EKG at 443 ms, and SR. He did see Lucas Dudley after d/c from hospital and he was in afib. He had eaten a big plate of chinese food the night before. He does feel improved energy and less dyspnea with exertion. Metoprolol was changed to carvedilol per Lucas Dudley. He has been on an up titration of carvedilol for LV dysfunction but after placed on 12.5 mg bid he c/o of feeling bad and more short of breath. He was reduced back to 6.25 mg bid with improvement of symptoms.   Today in the afib clinic, he states he has not felt up to par all day. EKG shows afib at 102 bpm. With more rest, HR was down to 70-80 beats pr minute. He has been compliant with tikosyn. Pt feels he was in SR yesterday.  He denies symptoms of palpitations, slightly more short of breath, fatigue today, no chest pain, orthopnea, PND, lower extremity edema, dizziness, presyncope, syncope, or neurologic sequela.  Past Medical History  Diagnosis Date  . Hypertension   . Glaucoma   . Hyperlipidemia   . OSA on CPAP 2012  . Chronic constipation   . Paroxysmal atrial fibrillation     a. 04/2015 Tikosyn loading, DCCV x 1;  b. CHA2DS2VASc = 5-->Eliquis.  . Abnormal nuclear stress test   . Chronic anticoagulation     Eliquis  . CAD (coronary artery disease)     a. Cath 03/17/15 showing 100% ostial D1, 50% prox LAD to mid LAD, 40% RPDA stenosis. Med rx.  [name] NICM (nonischemic cardiomyopathy)   . Kidney stones     "Dudley during prostate OR; still in there as far as I know" (05/04/2015)  . Prostate cancer   . Melanoma of neck     "right; came back IV; no  chemo"  . Heart murmur     "all my life"  . Myocardial infarct     "I was told 5-10 yr ago I'd had a MI in my 20's; now they say I didn't" (05/04/2015)  . Childhood asthma   . Pneumonia 1950's X 1  . Chronic bronchitis     "had it several times til I quit smoking"  . Type II diabetes mellitus   . Arthritis     "hands probably" (05/04/2015)  . Chronic lower back pain   . History of gout    Past Surgical History  Procedure Laterality Date  . Shoulder open rotator cuff repair Right X 2  . Nm myocar perf wall motion  02/21/2012    EF 61% ,EXERCISE 7 METS. exercise stopped due to wheezing and shortness of breathe  . Doppler echocardiography  05/25/2009    EF 50-55%,LA mildly dilated, LV function normal  . Carotid doppler  09/17/2008    rigt and left ICAs 0-49%;mildly  abnormal  . Cardiac catheterization N/A 03/17/2015    Procedure: Left Heart Cath and Coronary Angiography;  Surgeon: Lucas Harp, MD;  Location: [place] [facility] [facility];  Service: Cardiovascular;  Laterality: N/A;  . Prostatectomy    .  Electrophysiologic study N/A 04/05/2015    Procedure: Cardioversion;  Surgeon: Lucas Klein, MD;  Location: [place] [facility];  Service: Cardiovascular;  Laterality: N/A;  . Laparoscopic cholecystectomy    . Hernia repair    . Abdominal hernia repair      w/mesh  . Excisional hemorrhoidectomy      "inside and out"  . Fine needle aspiration Right     knee; "drew ~ 1 quart off"  . Melanoma excision Right     "neck"    Current Outpatient Prescriptions  Medication Sig Dispense Refill  . apixaban (ELIQUIS) 5 MG TABS tablet Take 1 tablet (5 mg total) by mouth 2 (two) times daily. 180 tablet 3  . carvedilol (COREG) 12.5 MG tablet Take 1 tablet (12.5 mg total) by mouth 2 (two) times daily. 60 tablet 1  . docusate sodium (COLACE) 100 MG capsule Take 1 capsule (100 mg total) by mouth 2 (two) times daily. (Patient taking differently: Take 100 mg by mouth daily. )    . dofetilide (TIKOSYN) 125  MCG capsule Take 3 capsules (375 mcg total) by mouth 2 (two) times daily. 180 capsule 6  . furosemide (LASIX) 20 MG tablet Take 20 mg by mouth daily.    [name] gemfibrozil (LOPID) 600 MG tablet Take 600 mg by mouth at bedtime.     [name] glimepiride (AMARYL) 2 MG tablet Take 2 mg by mouth 2 (two) times daily.     [name] latanoprost (XALATAN) 0.005 % ophthalmic solution Place 1 drop into both eyes at bedtime.    [name] lisinopril (PRINIVIL,ZESTRIL) 20 MG tablet Take 1 tablet (20 mg total) by mouth daily. (Patient taking differently: Take 20 mg by mouth at bedtime. ) 60 tablet 3  . magnesium oxide (MAG-OX) 400 MG tablet Take 1 tablet (400 mg total) by mouth 2 (two) times daily. 60 tablet 6  . metFORMIN (GLUCOPHAGE) 500 MG tablet Take 1,000 mg by mouth 2 (two) times daily.    . Omega-3 Fatty Acids (FISH OIL) 1000 MG CAPS Take 1,000 mg by mouth daily.     . sodium chloride (OCEAN) 0.65 % SOLN nasal spray Place 1 spray into both nostrils at bedtime.     No current facility-administered medications for this encounter.    Allergies  Allergen Reactions  . Tape Rash and Other (See Comments)    Causes skin redness, Use paper tape only.    Social History   Social History  . Marital Status: Married    Spouse Name: N/A  . Number of Children: N/A  . Years of Education: N/A   Occupational History  . Retired    Social History Main Topics  . Smoking status: Former Smoker -- 2.00 packs/day for 27 years    Types: Cigarettes    Quit date: 10/31/1983  . Smokeless tobacco: Never Used  . Alcohol Use: Yes     Comment: "used to drink; stopped ~ 2008"  . Drug Use: No  . Sexual Activity: Not Currently   Other Topics Concern  . Not on file   Social History Narrative   Lives in [place], [place] with wife.    Family History  Problem Relation Age of Onset  . Heart disease Mother   . Lung cancer Mother   . Healthy Daughter   . Diabetes Father   . Heart disease Father   . Stroke Brother   . Heart attack Mother 15     ROS- All systems are reviewed and negative except as per  the HPI above  Physical Exam: Filed Vitals:   06/06/15 1451  BP: 128/80  Pulse: 102  Height: 5\' 10"  (1.778 m)  Weight: 236 lb 3.2 oz (107.14 kg)    GEN- The patient is well appearing, alert and oriented x 3 today.   Head- normocephalic, atraumatic Eyes-  Sclera clear, conjunctiva pink Ears- hearing intact Oropharynx- clear Neck- supple, no JVP Lymph- no cervical lymphadenopathy Lungs- Clear to ausculation bilaterally, normal work of breathing Heart- Irregular rate and rhythm, no murmurs, rubs or gallops, PMI not laterally displaced GI- soft, NT, ND, + BS Extremities- no clubbing, cyanosis, or edema MS- no significant deformity or atrophy Skin- no rash or lesion Psych- euthymic mood, full affect Neuro- strength and sensation are intact  EKG-Afib with RVR, IRBBB,LAFB 102 bpm Mag at 1.8 8/13. Kt 4.9 8/1, Creat1.09  Assessment and Plan: 1. Persistent AFIB Maintaining SR with tikosyn, but in afib today QTc acceptable  2. LV dysfunction Continue carvedilol/lisinopril Did not tolerate higher carvedilol dose, keep at 6.25 mg bid for now. May benefit from repeat Echo after maintaining SR for several months to see if any improvement   F/u with me in one week to further assess rhythm, pt will benefit going forward to maintain SR due to LV dysfunction May need appointment with Dr. Rayann Dudley to discuss PVI.  Geroge Baseman Brianny Soulliere, [facility] [address] [place], [place] [address] [phone]

## 2015-06-08 ENCOUNTER — Institutional Professional Consult (permissible substitution): Payer: Medicare Other | Admitting: Internal Medicine

## 2015-06-14 ENCOUNTER — Ambulatory Visit (HOSPITAL_COMMUNITY)
Admission: RE | Admit: 2015-06-14 | Discharge: 2015-06-14 | Disposition: A | Discharge status: Home / Self Care | Payer: Medicare Other | Source: Ambulatory Visit | Attending: Nurse Practitioner | Admitting: Nurse Practitioner

## 2015-06-14 VITALS — BP 128/80 | HR 92 | Ht 71.0 in | Wt 237.4 lb

## 2015-06-14 DIAGNOSIS — Z79899 Other long term (current) drug therapy: Secondary | ICD-10-CM | POA: Insufficient documentation

## 2015-06-14 DIAGNOSIS — Z833 Family history of diabetes mellitus: Secondary | ICD-10-CM | POA: Diagnosis not present

## 2015-06-14 DIAGNOSIS — G4733 Obstructive sleep apnea (adult) (pediatric): Secondary | ICD-10-CM | POA: Diagnosis not present

## 2015-06-14 DIAGNOSIS — I2510 Atherosclerotic heart disease of native coronary artery without angina pectoris: Secondary | ICD-10-CM | POA: Insufficient documentation

## 2015-06-14 DIAGNOSIS — E119 Type 2 diabetes mellitus without complications: Secondary | ICD-10-CM | POA: Insufficient documentation

## 2015-06-14 DIAGNOSIS — I4819 Other persistent atrial fibrillation: Secondary | ICD-10-CM

## 2015-06-14 DIAGNOSIS — Z7902 Long term (current) use of antithrombotics/antiplatelets: Secondary | ICD-10-CM | POA: Diagnosis not present

## 2015-06-14 DIAGNOSIS — I481 Persistent atrial fibrillation: Secondary | ICD-10-CM | POA: Diagnosis not present

## 2015-06-14 DIAGNOSIS — Z85820 Personal history of malignant melanoma of skin: Secondary | ICD-10-CM | POA: Diagnosis not present

## 2015-06-14 DIAGNOSIS — Z87891 Personal history of nicotine dependence: Secondary | ICD-10-CM | POA: Diagnosis not present

## 2015-06-14 DIAGNOSIS — Z8249 Family history of ischemic heart disease and other diseases of the circulatory system: Secondary | ICD-10-CM | POA: Diagnosis not present

## 2015-06-14 DIAGNOSIS — I519 Heart disease, unspecified: Secondary | ICD-10-CM | POA: Diagnosis not present

## 2015-06-14 DIAGNOSIS — E785 Hyperlipidemia, unspecified: Secondary | ICD-10-CM | POA: Insufficient documentation

## 2015-06-14 DIAGNOSIS — Z8639 Personal history of other endocrine, nutritional and metabolic disease: Secondary | ICD-10-CM | POA: Insufficient documentation

## 2015-06-14 DIAGNOSIS — I4891 Unspecified atrial fibrillation: Secondary | ICD-10-CM | POA: Diagnosis present

## 2015-06-14 DIAGNOSIS — I10 Essential (primary) hypertension: Secondary | ICD-10-CM | POA: Insufficient documentation

## 2015-06-14 DIAGNOSIS — Z8546 Personal history of malignant neoplasm of prostate: Secondary | ICD-10-CM | POA: Insufficient documentation

## 2015-06-14 MED ORDER — FUROSEMIDE 20 MG PO TABS: 20.0000 mg | ORAL_TABLET | Freq: Two times a day (BID) | ORAL | Status: DC

## 2015-06-14 MED ORDER — CARVEDILOL 12.5 MG PO TABS
ORAL_TABLET | ORAL | Status: DC
Start: 2015-06-14 — End: 2015-07-01

## 2015-06-14 NOTE — Patient Instructions (Signed)
Your physician has recommended you make the following change in your medication:  1)Increase coreg to 25mg  (2 tablets of the 12.5mg ) in the morning and 12.5mg  (1 tablet) in the evening.

## 2015-06-15 ENCOUNTER — Encounter (HOSPITAL_COMMUNITY): Payer: Self-pay | Admitting: Nurse Practitioner

## 2015-06-15 NOTE — Progress Notes (Signed)
Patient ID: Lucas Dudley, male   DOB: 1941-08-18, 74 y.o.   MRN: [id number]     Primary Care Physician: Robert Bellow, MD Referring Physician: Dr. Verlene Mayer is a 74 y.o. male with a h/o persistent afib that was in the hospital, 7/20-7/23, for tikosyn loading. Pt did have prolonged QTc and dose was reduced to 125 mcg q 12 hours. Qtc remained stable on that dose. [name] He did see Dr. Gwenlyn Found after d/c from hospital and he was in afib. [name] He does feel improved energy and less dyspnea with exertion in SR.. Metoprolol was changed to carvedilol per Dr. Leonard Downing LV dysfunction.. He has been on an up titration of carvedilol for LV dysfunction but after placed on 12.5 mg bid he c/o of feeling bad and more short of breath. He was reduced back to 6.25 mg bid with improvement of symptoms.   Last week in clinic, he states he has not felt up to par all day. EKG shows afib at 102 bpm. With more rest, HR was down to 70-80 beats pr minute. He has been compliant with tikosyn. Pt feels he was in SR yesterday.   He was brought back today to assess rhythm since he was found to be in afib last week with pt reporting in and out of rhythm. He remains in afib and feels like he has been in afib for several days. Pt was offered a cardioversion but he declines. He has an appointment set up within 2 weeks to see Dr. Rayann Heman to discuss possibility of ablation since he appears to be failing tikosyn. He now is on 12.5 mg carvedilol and tolerating and will increase am dose to 25 mg for better rate control.  He denies symptoms of palpitations, slightly more short of breath, fatigue today, no chest pain, orthopnea, PND, lower extremity edema, dizziness, presyncope, syncope, or neurologic sequela.  Past Medical History  Diagnosis Date  . Hypertension   . Glaucoma   . Hyperlipidemia   . OSA on CPAP 2012  . Chronic constipation   . Paroxysmal atrial fibrillation     a. 04/2015 Tikosyn loading, DCCV x 1;  b. CHA2DS2VASc =  5-->Eliquis.  . Abnormal nuclear stress test   . Chronic anticoagulation     Eliquis  . CAD (coronary artery disease)     a. Cath 03/17/15 showing 100% ostial D1, 50% prox LAD to mid LAD, 40% RPDA stenosis. Med rx.  [name] NICM (nonischemic cardiomyopathy)   . Kidney stones     "found during prostate OR; still in there as far as I know" (05/04/2015)  . Prostate cancer   . 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 Heart murmur     "all my life"  . Myocardial infarct     "I was told 5-10 yr ago I'd had a MI in my 20's; now they say I didn't" (05/04/2015)  . Childhood asthma   . Pneumonia 1950's X 1  . Chronic bronchitis     "had it several times til I quit smoking"  . Type II diabetes mellitus   . Arthritis     "hands probably" (05/04/2015)  . Chronic lower back pain   . History of gout    Past Surgical History  Procedure Laterality Date  . Shoulder open rotator cuff repair Right X 2  . Nm myocar perf wall motion  02/21/2012    EF 61% ,EXERCISE 7 METS. exercise  stopped due to wheezing and shortness of breathe  . Doppler echocardiography  05/25/2009    EF 50-55%,LA mildly dilated, LV function normal  . Carotid doppler  09/17/2008    rigt and left ICAs 0-49%;mildly  abnormal  . Cardiac catheterization N/A 03/17/2015    Procedure: Left Heart Cath and Coronary Angiography;  Surgeon: Lorretta Harp, MD;  Location: [facility];  Service: Cardiovascular;  Laterality: N/A;  . Prostatectomy    . Electrophysiologic study N/A 04/05/2015    Procedure: Cardioversion;  Surgeon: Sanda Klein, MD;  Location: [place] [facility];  Service: Cardiovascular;  Laterality: N/A;  . Laparoscopic cholecystectomy    . Hernia repair    . Abdominal hernia repair      w/mesh  . Excisional hemorrhoidectomy      "inside and out"  . Fine needle aspiration Right     knee; "drew ~ 1 quart off"  . Melanoma excision Right     "neck"    Current Outpatient Prescriptions  Medication Sig  Dispense Refill  . apixaban (ELIQUIS) 5 MG TABS tablet Take 1 tablet (5 mg total) by mouth 2 (two) times daily. 180 tablet 3  . carvedilol (COREG) 12.5 MG tablet Take 25mg  (2 tablets) in the AM and 12.5mg  (1 tablet) in the PM 60 tablet 1  . docusate sodium (COLACE) 100 MG capsule Take 1 capsule (100 mg total) by mouth 2 (two) times daily. (Patient taking differently: Take 100 mg by mouth daily. )    . dofetilide (TIKOSYN) 125 MCG capsule Take 3 capsules (375 mcg total) by mouth 2 (two) times daily. 180 capsule 6  . furosemide (LASIX) 20 MG tablet Take 1 tablet (20 mg total) by mouth 2 (two) times daily. 60 tablet 6  . gemfibrozil (LOPID) 600 MG tablet Take 600 mg by mouth at bedtime.     [name] glimepiride (AMARYL) 2 MG tablet Take 2 mg by mouth 2 (two) times daily.     [name] latanoprost (XALATAN) 0.005 % ophthalmic solution Place 1 drop into both eyes at bedtime.    [name] lisinopril (PRINIVIL,ZESTRIL) 20 MG tablet Take 1 tablet (20 mg total) by mouth daily. (Patient taking differently: Take 20 mg by mouth at bedtime. ) 60 tablet 3  . magnesium oxide (MAG-OX) 400 MG tablet Take 1 tablet (400 mg total) by mouth 2 (two) times daily. 60 tablet 6  . metFORMIN (GLUCOPHAGE) 500 MG tablet Take 1,000 mg by mouth 2 (two) times daily.    . Omega-3 Fatty Acids (FISH OIL) 1000 MG CAPS Take 1,000 mg by mouth daily.     . sodium chloride (OCEAN) 0.65 % SOLN nasal spray Place 1 spray into both nostrils at bedtime.     No current facility-administered medications for this encounter.    Allergies  Allergen Reactions  . Tape Rash and Other (See Comments)    Causes skin redness, Use paper tape only.    Social History   Social History  . Marital Status: Married    Spouse Name: N/A  . Number of Children: N/A  . Years of Education: N/A   Occupational History  . Retired    Social History Main Topics  . Smoking status: Former Smoker -- 2.00 packs/day for 27 years    Types: Cigarettes    Quit date: 10/31/1983  .  Smokeless tobacco: Never Used  . Alcohol Use: Yes     Comment: "used to drink; stopped ~ 2008"  . Drug Use: No  .  Sexual Activity: Not Currently   Other Topics Concern  . Not on file   Social History Narrative   Lives in [place], [place] with wife.    Family History  Problem Relation Age of Onset  . Heart disease Mother   . Lung cancer Mother   . Healthy Daughter   . Diabetes Father   . Heart disease Father   . Stroke Brother   . Heart attack Mother 60    ROS- All systems are reviewed and negative except as per the HPI above  Physical Exam: Filed Vitals:   06/14/15 1417  BP: 128/80  Pulse: 92  Height: 5\' 11"  (1.803 m)  Weight: 237 lb 6.4 oz (107.684 kg)    GEN- The patient is well appearing, alert and oriented x 3 today.   Head- normocephalic, atraumatic Eyes-  Sclera clear, conjunctiva pink Ears- hearing intact Oropharynx- clear Neck- supple, no JVP Lymph- no cervical lymphadenopathy Lungs- Clear to ausculation bilaterally, normal work of breathing Heart- Irregular rate and rhythm, no murmurs, rubs or gallops, PMI not laterally displaced GI- soft, NT, ND, + BS Extremities- no clubbing, cyanosis, or edema MS- no significant deformity or atrophy Skin- no rash or lesion Psych- euthymic mood, full affect Neuro- strength and sensation are intact  EKG-Afib at 92 bpm,, IRBBB,LAD 13 r/o old inferior/anteroseptal MI. Mag at 1.8 8/13. Kt 4.9 8/1, Creat1.09  Assessment and Plan: 1. Persistent AFIB Appears to be failing Tikosyn  QTc acceptable   2. LV dysfunction Continue carvedilol/lisinopril Currently tolerating carvedilol at 12.5 mg bid Increase am dose to 25 mg, for better rate control, continue pm dose for now 12.5 mg Would benefit pt to be in SR for improvement in LV dysfunction  Stay on tikosyn for now and f/u with Dr. Rayann Heman 9/16 to discuss possibility of ablation.   Geroge Baseman Talib Headley, [facility] [address] [place], [place] [address] [phone]

## 2015-07-01 ENCOUNTER — Ambulatory Visit (INDEPENDENT_AMBULATORY_CARE_PROVIDER_SITE_OTHER): Payer: Medicare Other | Admitting: Internal Medicine

## 2015-07-01 ENCOUNTER — Encounter: Payer: Self-pay | Admitting: *Deleted

## 2015-07-01 ENCOUNTER — Encounter: Payer: Self-pay | Admitting: Internal Medicine

## 2015-07-01 VITALS — BP 116/64 | HR 46 | Ht 70.5 in | Wt 238.8 lb

## 2015-07-01 DIAGNOSIS — I480 Paroxysmal atrial fibrillation: Secondary | ICD-10-CM

## 2015-07-01 DIAGNOSIS — Z7901 Long term (current) use of anticoagulants: Secondary | ICD-10-CM

## 2015-07-01 DIAGNOSIS — I519 Heart disease, unspecified: Secondary | ICD-10-CM

## 2015-07-01 DIAGNOSIS — I481 Persistent atrial fibrillation: Secondary | ICD-10-CM | POA: Diagnosis not present

## 2015-07-01 DIAGNOSIS — I10 Essential (primary) hypertension: Secondary | ICD-10-CM | POA: Diagnosis not present

## 2015-07-01 DIAGNOSIS — I4819 Other persistent atrial fibrillation: Secondary | ICD-10-CM

## 2015-07-01 NOTE — Patient Instructions (Signed)
Medication Instructions:  Your physician recommends that you continue on your current medications as directed. Please refer to the Current Medication list given to you today.   Labwork: Your physician recommends that you return for lab work on 07/28/15 at 10:00am   Testing/Procedures: Your physician has requested that you have a TEE. During a TEE, sound waves are used to create images of your heart. It provides your doctor with information about the size and shape of your heart and how well your heart's chambers and valves are working. In this test, a transducer is attached to the end of a flexible tube that's guided down your throat and into your esophagus (the tube leading from you mouth to your stomach) to get a more detailed image of your heart. You are not awake for the procedure. Please see the instruction sheet given to you today. For further information please visit [email or web address].    Your physician has recommended that you have an ablation. Catheter ablation is a medical procedure used to treat some cardiac arrhythmias (irregular heartbeats). During catheter ablation, a long, thin, flexible tube is put into a blood vessel in your groin (upper thigh), or neck. This tube is called an ablation catheter. It is then guided to your heart through the blood vessel. Radio frequency waves destroy small areas of heart tissue where abnormal heartbeats may cause an arrhythmia to start. Please see the instruction sheet given to you today.    Follow-Up: Your physician recommends that you schedule a follow-up appointment in: 4 weeks from 08/05/15 with Roderic Palau, NP and 3 months with Dr Rayann Heman   Any Other Special Instructions Will Be Listed Below (If Applicable).

## 2015-07-02 DIAGNOSIS — I519 Heart disease, unspecified: Secondary | ICD-10-CM | POA: Insufficient documentation

## 2015-07-02 NOTE — Progress Notes (Signed)
Electrophysiology Office Note   Date:  07/02/2015   ID:  Lucas Dudley, DOB 09-30-1941, MRN [id number]  PCP:  Robert Bellow, MD  Cardiologist:  Dr Gwenlyn Found Primary Electrophysiologist: Thompson Grayer, MD    Chief Complaint  Patient presents with  . Persistent AFIB     History of Present Illness: Lucas Dudley is a 74 y.o. male who presents today for electrophysiology evaluation.  He has a h/o persistent afib that was in the hospital, 7/20-7/23, for tikosyn loading. Pt did have prolonged QTc and dose was reduced to 125 mcg q 12 hours. Qtc remained stable on that dose. [name] He did see Dr. Gwenlyn Found after d/c from hospital and he was in afib.  He does feel improved energy and less dyspnea with exertion in SR. Metoprolol was changed to carvedilol per Dr. Leonard Downing LV dysfunction.. He has been on an up titration of carvedilol for LV dysfunction but after placed on 12.5 mg bid he c/o of feeling bad and more short of breath. He was reduced back to 6.25 mg bid with improvement of symptoms.   V rates remain elevated at times.  His EF is reduced.  With AF, he has SOB and fatigue.   Today, he denies symptoms of palpitations, chest pain,  orthopnea, PND, lower extremity edema, claudication, dizziness, presyncope, syncope, bleeding, or neurologic sequela. The patient is tolerating medications without difficulties and is otherwise without complaint today.    Past Medical History  Diagnosis Date  . Hypertension   . Glaucoma   . Hyperlipidemia   . OSA on CPAP 2012  . Chronic constipation   . Paroxysmal atrial fibrillation     a. 04/2015 Tikosyn loading, DCCV x 1;  b. CHA2DS2VASc = 5-->Eliquis.  . Abnormal nuclear stress test   . Chronic anticoagulation     Eliquis  . CAD (coronary artery disease)     a. Cath 03/17/15 showing 100% ostial D1, 50% prox LAD to mid LAD, 40% RPDA stenosis. Med rx.  [name] NICM (nonischemic cardiomyopathy)   . Kidney stones     "found during prostate OR; still in there as far as I  know" (05/04/2015)  . Prostate cancer   . 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 Heart murmur     "all my life"  . Myocardial infarct     "I was told 5-10 yr ago I'd had a MI in my 20's; now they say I didn't" (05/04/2015)  . Childhood asthma   . Pneumonia 1950's X 1  . Chronic bronchitis     "had it several times til I quit smoking"  . Type II diabetes mellitus   . Arthritis     "hands probably" (05/04/2015)  . Chronic lower back pain   . History of gout    Past Surgical History  Procedure Laterality Date  . Shoulder open rotator cuff repair Right X 2  . Nm myocar perf wall motion  02/21/2012    EF 61% ,EXERCISE 7 METS. exercise stopped due to wheezing and shortness of breathe  . Doppler echocardiography  05/25/2009    EF 50-55%,LA mildly dilated, LV function normal  . Carotid doppler  09/17/2008    rigt and left ICAs 0-49%;mildly  abnormal  . Cardiac catheterization N/A 03/17/2015    Procedure: Left Heart Cath and Coronary Angiography;  Surgeon: Lorretta Harp, MD;  Location: [facility];  Service: Cardiovascular;  Laterality: N/A;  . Prostatectomy    .  Electrophysiologic study N/A 04/05/2015    Procedure: Cardioversion;  Surgeon: Sanda Klein, MD;  Location: [facility];  Service: Cardiovascular;  Laterality: N/A;  . Laparoscopic cholecystectomy    . Hernia repair    . Abdominal hernia repair      w/mesh  . Excisional hemorrhoidectomy      "inside and out"  . Fine needle aspiration Right     knee; "drew ~ 1 quart off"  . Melanoma excision Right     "neck"     Current Outpatient Prescriptions  Medication Sig Dispense Refill  . apixaban (ELIQUIS) 5 MG TABS tablet Take 1 tablet (5 mg total) by mouth 2 (two) times daily. 180 tablet 3  . carvedilol (COREG) 12.5 MG tablet Take 25mg  (2 tablets) by mouth in the AM and 12.5mg  (1 tablet) in the PM    . docusate sodium (COLACE) 100 MG capsule Take 100 mg by mouth daily as needed (CONSTIPATION).      [name] dofetilide (TIKOSYN) 125 MCG capsule Take 3 capsules (375 mcg total) by mouth 2 (two) times daily. 180 capsule 6  . furosemide (LASIX) 20 MG tablet Take 1 tablet (20 mg total) by mouth 2 (two) times daily. 60 tablet 6  . gemfibrozil (LOPID) 600 MG tablet Take 600 mg by mouth at bedtime.     [name] glimepiride (AMARYL) 2 MG tablet Take 2 mg by mouth 2 (two) times daily.     [name] latanoprost (XALATAN) 0.005 % ophthalmic solution Place 1 drop into both eyes at bedtime.    [name] lisinopril (PRINIVIL,ZESTRIL) 20 MG tablet Take 20 mg by mouth at bedtime.    . magnesium oxide (MAG-OX) 400 MG tablet Take 1 tablet (400 mg total) by mouth 2 (two) times daily. 60 tablet 6  . metFORMIN (GLUCOPHAGE) 500 MG tablet Take 1,000 mg by mouth 2 (two) times daily.    . OMEGA-3 FATTY ACIDS PO Take 1,200 mg by mouth daily.    . sodium chloride (OCEAN) 0.65 % SOLN nasal spray Place 1 spray into both nostrils at bedtime as needed for congestion.      No current facility-administered medications for this visit.    Allergies:   Tape   Social History:  The patient  reports that he quit smoking about 31 years ago. His smoking use included Cigarettes. He has a 54 pack-year smoking history. He has never used smokeless tobacco. He reports that he drinks alcohol. He reports that he does not use illicit drugs.   Family History:  The patient's  family history includes Diabetes in his father; Healthy in his daughter; Heart attack (age of onset: 95) in his mother; Heart disease in his father and mother; Lung cancer in his mother; Stroke in his brother.    ROS:  Please see the history of present illness.   All other systems are reviewed and negative.    PHYSICAL EXAM: VS:  BP 116/64 mmHg  Pulse 46  Ht 5' 10.5" (1.791 m)  Wt 238 lb 12.8 oz (108.319 kg)  BMI 33.77 kg/m2 , BMI Body mass index is 33.77 kg/(m^2). GEN: Well nourished, well developed, in no acute distress HEENT: normal Neck: no JVD, carotid bruits, or masses Cardiac:  RRR; no murmurs, rubs, or gallops,no edema  Respiratory:  clear to auscultation bilaterally, normal work of breathing GI: soft, nontender, nondistended, + BS MS: no deformity or atrophy Skin: warm and dry  Neuro:  Strength and sensation are intact Psych: euthymic mood, full affect  EKG:  EKG today is reviewed  Recent Labs: 03/11/2015: Hemoglobin 14.7; Platelets 233; TSH 2.663 05/16/2015: BUN 27*; Creatinine, Ser 1.06; Potassium 4.9; Sodium 137 05/26/2015: Magnesium 1.8    Lipid Panel  No results found for: CHOL, TRIG, HDL, CHOLHDL, VLDL, LDLCALC, LDLDIRECT   Wt Readings from Last 3 Encounters:  07/01/15 238 lb 12.8 oz (108.319 kg)  06/14/15 237 lb 6.4 oz (107.684 kg)  06/06/15 236 lb 3.2 oz (107.14 kg)      Other studies Reviewed: Additional studies/ records that were reviewed today include: AF clinic notes, Dr Rachel Bo notes, prior cath and echo   ASSESSMENT AND PLAN:  1.  Persistent atrial fibrillation The patient has symptomatic atrial fibrillation.  He has failed medical therapy with tikosyn.  He has a depressed EF and will likely do better with sinus rhythm long term.  His chads2vasc score is at least 5.  He is appropriately anticoagulated with eliquis at this time. Therapeutic strategies for afib including medicine and ablation were discussed in detail with the patient and his wife/ daughter today. Risk, benefits, and alternatives to EP study and radiofrequency ablation for afib were also discussed in detail today. These risks include but are not limited to stroke, bleeding, vascular damage, tamponade, perforation, damage to the esophagus, lungs, and other structures, pulmonary vein stenosis, worsening renal function, and death. The patient understands these risk and wishes to proceed.  We will therefore proceed with catheter ablation at the next available time.  2. Nonischemic CM/ chronic systolic dysfunction Would favor ongoing medical therapy and then to reassess EF once in  sinus post ablation.  Would like to avoid ICD implantation if possible Continue current medical therapy  3. HTN Stable No change required today  4. CAD No ischemic symptoms  5. OSA Followed by Dr Claiborne Billings  Current medicines are reviewed at length with the patient today.   The patient does not have concerns regarding his medicines.  The following changes were made today:  none  Labs/ tests ordered today include:  Orders Placed This Encounter  Procedures  . Basic metabolic panel  . CBC with Differential  . EKG 12-Lead     Signed, Thompson Grayer, MD  07/02/2015 10:58 PM     [place] [address] [phone] (office) [phone] (fax)

## 2015-07-12 ENCOUNTER — Ambulatory Visit (HOSPITAL_BASED_OUTPATIENT_CLINIC_OR_DEPARTMENT_OTHER): Payer: Medicare Other | Attending: Cardiovascular Disease | Admitting: Radiology

## 2015-07-12 DIAGNOSIS — I493 Ventricular premature depolarization: Secondary | ICD-10-CM | POA: Diagnosis not present

## 2015-07-12 DIAGNOSIS — R0683 Snoring: Secondary | ICD-10-CM | POA: Diagnosis not present

## 2015-07-12 DIAGNOSIS — Z6834 Body mass index (BMI) 34.0-34.9, adult: Secondary | ICD-10-CM | POA: Diagnosis not present

## 2015-07-12 DIAGNOSIS — I10 Essential (primary) hypertension: Secondary | ICD-10-CM | POA: Diagnosis not present

## 2015-07-12 DIAGNOSIS — G4733 Obstructive sleep apnea (adult) (pediatric): Secondary | ICD-10-CM | POA: Diagnosis not present

## 2015-07-12 DIAGNOSIS — E669 Obesity, unspecified: Secondary | ICD-10-CM | POA: Diagnosis not present

## 2015-07-12 DIAGNOSIS — R5383 Other fatigue: Secondary | ICD-10-CM | POA: Insufficient documentation

## 2015-07-21 ENCOUNTER — Telehealth: Payer: Self-pay | Admitting: *Deleted

## 2015-07-21 NOTE — Telephone Encounter (Signed)
Spoke with patient and he only wants Dr Rayann Heman to do his procedure.  He is going to reschedule to 09/06/15 at 5:30am/7:30 for the ablation and the TEE on 09/05/15 with Dr Radford Pax at 9:00/10:30.  I will re do letter with different dates and get to him   Labs will be 08/30/15

## 2015-07-25 NOTE — Sleep Study (Signed)
Patient Name: Lucas Dudley, Lucas Dudley Date: 07/12/2015 Gender: Male D.O.B: September 19, 1941 Age (years): 74 Referring Provider: Shelva Majestic MD, ABSM Height (inches): 70 Interpreting Physician: Shelva Majestic MD, ABSM Weight (lbs): 236 RPSGT: Laren Everts BMI: 34 MRN: [id number] Neck Size: 17.50 CLINICAL INFORMATION Sleep Study Type: Split Night CPAP  Indication for sleep study: Excessive Daytime Sleepiness, Fatigue, Hypertension, Obesity, OSA, Re-Evaluation, Snoring, Witnessed Apneas  Epworth Sleepiness Score: 14  SLEEP STUDY TECHNIQUE As per the AASM Manual for the Scoring of Sleep and Associated Events v2.3 (April 2016) with a hypopnea requiring 4% desaturations.  The channels recorded and monitored were frontal, central and occipital EEG, electrooculogram (EOG), submentalis EMG (chin), nasal and oral airflow, thoracic and abdominal wall motion, anterior tibialis EMG, snore microphone, electrocardiogram, and pulse oximetry. Continuous positive airway pressure (CPAP) was initiated when the patient met split night criteria and was titrated according to treat sleep-disordered breathing.  MEDICATIONS  apixaban (ELIQUIS) 5 MG TABS tablet 5 mg, 2 times daily     carvedilol (COREG) 12.5 MG tablet      docusate sodium (COLACE) 100 MG capsule 100 mg, Daily PRN     dofetilide (TIKOSYN) 125 MCG capsule 375 mcg, 2 times daily     furosemide (LASIX) 20 MG tablet 20 mg, 2 times daily     gemfibrozil (LOPID) 600 MG tablet 600 mg, Daily at bedtime     glimepiride (AMARYL) 2 MG tablet 2 mg, 2 times daily     latanoprost (XALATAN) 0.005 % ophthalmic solution 1 drop, Daily at bedtime     lisinopril (PRINIVIL,ZESTRIL) 20 MG tablet 20 mg, Daily at bedtime     magnesium oxide (MAG-OX) 400 MG tablet 400 mg, 2 times daily     metFORMIN (GLUCOPHAGE) 500 MG tablet 1,000 mg, 2 times daily     OMEGA-3 FATTY ACIDS PO 1,200 mg, Daily     sodium chloride (OCEAN) 0.65 %   Medications administered by  patient during sleep study : No sleep medicine administered.  RESPIRATORY PARAMETERS Diagnostic  Total AHI (/hr): 71.5 RDI (/hr): 79.4 OA Index (/hr): 1.3 CA Index (/hr): 0.0 REM AHI (/hr): 72.0 NREM AHI (/hr): 71.5 Supine AHI (/hr): N/A Non-supine AHI (/hr): 71.50 Min O2 Sat (%): 83.00 Mean O2 (%): 92.11 Time below 88% (min): 18.3   Titration  Optimal Pressure (cm):  AHI at Optimal Pressure (/hr): N/A Min O2 at Optimal Pressure (%): 83.00 Supine % at Optimal (%): N/A Sleep % at Optimal (%): N/A    SLEEP ARCHITECTURE The recording time for the entire night was 381.7 minutes.  During a baseline period of 182.5 minutes, the patient slept for 143.5 minutes in REM and nonREM, yielding a sleep efficiency of 78.6%. Sleep onset after lights out was 4.2 minutes with a REM latency of 159.0 minutes. Wake after sleep onset (WASO) was 34.8 minutes. The patient spent 22.30% of the night in stage N1 sleep, 75.96% in stage N2 sleep, 0.00% in stage N3 and 1.74% in REM.  During the titration period of 194.0 minutes, the patient slept for 151.0 minutes in REM and nonREM, yielding a sleep efficiency of 77.9%. Sleep onset after CPAP initiation was 21.9 minutes with a REM latency of 54.5 minutes. The patient spent 15.23% of the night in stage N1 sleep, 48.34% in stage N2 sleep, 0.00% in stage N3 and 36.42% in REM.  The patient was titrated up to 10 cm water pressure.  Supine sleep was not achieved both on the diagnostic and CPAP titration  evaluation.    CARDIAC DATA The 2 lead EKG demonstrated sinus rhythm. The mean heart rate was 51.17 beats per minute. Other EKG findings include: PVCs.  LEG MOVEMENT DATA The total Periodic Limb Movements of Sleep (PLMS) were 211. The PLMS index was 42.99 .  IMPRESSIONS Severe obstructive sleep apnea occurred during the diagnostic portion of the study (AHI = 71.5/hour).  No significant central sleep apnea occurred during the diagnostic portion of the study (CAI =  0.0/hour). Abnormal sleep architecture with reduction in slow-wave sleep and prolonged latency to REM sleep. Moderate oxygen desaturation during the diagnostic portion of the study with an oxygen saturation nadir of 83.00%. Moderate snoring. EKG findings include PVCs. The arousal index was severely abnormal Periodic limb movements during sleep with an index of 42.99.  DIAGNOSIS Obstructive Sleep Apnea (327.23 [G47.33 ICD-10])  RECOMMENDATIONS Recommend initial CPAP Auto with C-Flex/EPR of 3 at 8-20 cm water pressure with heated humidification. Patient preferred a nasal pillow type mask; oral venting was noted and chin strap is recommended.  Avoid alcohol, sedatives and other CNS depressants that may worsen sleep apnea and disrupt normal sleep architecture. Sleep hygiene should be reviewed to assess factors that may improve sleep quality. Weight management and regular exercise should be initiated or continued. Recommend download in 30 days and sleep clinic evaluation.    Troy Sine, MD, [place], American Board of Sleep Medicine  ELECTRONICALLY SIGNED ON:  07/25/2015, 9:49 AM [place] PH: [phone]   FX: [phone] [place]

## 2015-07-26 ENCOUNTER — Telehealth: Payer: Self-pay | Admitting: *Deleted

## 2015-07-26 NOTE — Telephone Encounter (Signed)
-----   Message from Troy Sine, MD sent at 07/25/2015 10:22 AM EDT ----- Mariann Laster set up with DME for CPAP Auto and f/u sleep clinic

## 2015-07-26 NOTE — Telephone Encounter (Signed)
Left message to return a call to discuss sleep study results. 

## 2015-07-27 ENCOUNTER — Telehealth: Payer: Self-pay | Admitting: *Deleted

## 2015-07-27 NOTE — Telephone Encounter (Signed)
Called and notified patient of sleep study results and recommendations. Patient voiced understanding . Referral will be done to DME company.

## 2015-07-27 NOTE — Progress Notes (Signed)
CPAP set up referral sent to [facility].

## 2015-07-27 NOTE — Telephone Encounter (Signed)
-----   Message from Troy Sine, MD sent at 07/25/2015 10:22 AM EDT ----- Mariann Laster set up with DME for CPAP Auto and f/u sleep clinic

## 2015-07-28 ENCOUNTER — Other Ambulatory Visit: Payer: Medicare Other

## 2015-07-30 ENCOUNTER — Other Ambulatory Visit: Payer: Self-pay | Admitting: Cardiovascular Disease

## 2015-08-01 NOTE — Telephone Encounter (Signed)
Rx request sent to pharmacy.  

## 2015-08-03 NOTE — Telephone Encounter (Addendum)
Spoke with patient and will redo his letter for procedure and mail to him  Gave all times and dates on the phone

## 2015-08-03 NOTE — Telephone Encounter (Signed)
New Message  Pt request a call back to discuss when his bloodwork will be completed so that he can 'get it done" He says that it is for the ablation. Request a call back to discuss the ablation and the steps he will take leading up to it. Pt states that he was looking for it in the mail. Have not received it yet. Please call.

## 2015-08-08 ENCOUNTER — Encounter: Payer: PRIVATE HEALTH INSURANCE | Admitting: Internal Medicine

## 2015-08-10 ENCOUNTER — Encounter: Payer: Self-pay | Admitting: *Deleted

## 2015-08-10 ENCOUNTER — Telehealth: Payer: Self-pay | Admitting: Internal Medicine

## 2015-08-10 NOTE — Telephone Encounter (Signed)
Letter has been mailed today

## 2015-08-10 NOTE — Telephone Encounter (Signed)
New problem   Pt stated he still haven't received the paperwork that was suppose to be mailed to him concerning his ablation. Please advise pt.

## 2015-08-30 ENCOUNTER — Other Ambulatory Visit (INDEPENDENT_AMBULATORY_CARE_PROVIDER_SITE_OTHER): Payer: Medicare Other | Admitting: *Deleted

## 2015-08-30 DIAGNOSIS — I480 Paroxysmal atrial fibrillation: Secondary | ICD-10-CM

## 2015-08-30 DIAGNOSIS — I10 Essential (primary) hypertension: Secondary | ICD-10-CM | POA: Diagnosis not present

## 2015-08-30 DIAGNOSIS — E785 Hyperlipidemia, unspecified: Secondary | ICD-10-CM | POA: Diagnosis not present

## 2015-08-30 DIAGNOSIS — I4891 Unspecified atrial fibrillation: Secondary | ICD-10-CM

## 2015-08-30 DIAGNOSIS — I4819 Other persistent atrial fibrillation: Secondary | ICD-10-CM

## 2015-08-30 DIAGNOSIS — I481 Persistent atrial fibrillation: Secondary | ICD-10-CM

## 2015-08-30 LAB — CBC WITH DIFFERENTIAL/PLATELET
BASOS ABS: 0 10*3/uL (ref 0.0–0.1)
Basophils Relative: 0 % (ref 0–1)
EOS ABS: 0.1 10*3/uL (ref 0.0–0.7)
Eosinophils Relative: 2 % (ref 0–5)
HEMATOCRIT: 43.1 % (ref 39.0–52.0)
Hemoglobin: 15 g/dL (ref 13.0–17.0)
LYMPHS PCT: 27 % (ref 12–46)
Lymphs Abs: 1.8 10*3/uL (ref 0.7–4.0)
MCH: 30.5 pg (ref 26.0–34.0)
MCHC: 34.8 g/dL (ref 30.0–36.0)
MCV: 87.6 fL (ref 78.0–100.0)
MPV: 10.4 fL (ref 8.6–12.4)
Monocytes Absolute: 0.5 10*3/uL (ref 0.1–1.0)
Monocytes Relative: 7 % (ref 3–12)
Neutro Abs: 4.4 10*3/uL (ref 1.7–7.7)
Neutrophils Relative %: 64 % (ref 43–77)
PLATELETS: 233 10*3/uL (ref 150–400)
RBC: 4.92 MIL/uL (ref 4.22–5.81)
RDW: 13.9 % (ref 11.5–15.5)
WBC: 6.8 10*3/uL (ref 4.0–10.5)

## 2015-08-30 LAB — BASIC METABOLIC PANEL
BUN: 25 mg/dL (ref 7–25)
CO2: 28 mmol/L (ref 20–31)
Calcium: 9.2 mg/dL (ref 8.6–10.3)
Chloride: 96 mmol/L — ABNORMAL LOW (ref 98–110)
Creat: 1 mg/dL (ref 0.70–1.18)
Glucose, Bld: 274 mg/dL — ABNORMAL HIGH (ref 65–99)
POTASSIUM: 4.5 mmol/L (ref 3.5–5.3)
Sodium: 132 mmol/L — ABNORMAL LOW (ref 135–146)

## 2015-08-30 LAB — MAGNESIUM: MAGNESIUM: 1.6 mg/dL (ref 1.5–2.5)

## 2015-08-30 NOTE — Addendum Note (Signed)
Addended by: Eulis Foster on: 08/30/2015 10:05 AM   Modules accepted: Orders

## 2015-09-05 ENCOUNTER — Encounter (HOSPITAL_COMMUNITY): Payer: Self-pay | Admitting: *Deleted

## 2015-09-05 ENCOUNTER — Telehealth: Payer: Self-pay | Admitting: Internal Medicine

## 2015-09-05 ENCOUNTER — Ambulatory Visit (HOSPITAL_COMMUNITY)
Admission: RE | Admit: 2015-09-05 | Discharge: 2015-09-05 | Disposition: A | Discharge status: Home / Self Care | Payer: Medicare Other | Source: Ambulatory Visit | Attending: Cardiology | Admitting: Cardiology

## 2015-09-05 ENCOUNTER — Encounter (HOSPITAL_COMMUNITY)
Admission: RE | Disposition: A | Discharge status: Home / Self Care | Payer: Self-pay | Source: Ambulatory Visit | Attending: Cardiology

## 2015-09-05 ENCOUNTER — Ambulatory Visit (HOSPITAL_COMMUNITY): Admission: RE | Admit: 2015-09-05 | Payer: Medicare Other | Source: Ambulatory Visit | Admitting: Cardiology

## 2015-09-05 DIAGNOSIS — I428 Other cardiomyopathies: Secondary | ICD-10-CM | POA: Insufficient documentation

## 2015-09-05 DIAGNOSIS — I252 Old myocardial infarction: Secondary | ICD-10-CM | POA: Diagnosis not present

## 2015-09-05 DIAGNOSIS — Z7984 Long term (current) use of oral hypoglycemic drugs: Secondary | ICD-10-CM | POA: Insufficient documentation

## 2015-09-05 DIAGNOSIS — E785 Hyperlipidemia, unspecified: Secondary | ICD-10-CM | POA: Insufficient documentation

## 2015-09-05 DIAGNOSIS — Z8249 Family history of ischemic heart disease and other diseases of the circulatory system: Secondary | ICD-10-CM | POA: Diagnosis not present

## 2015-09-05 DIAGNOSIS — E119 Type 2 diabetes mellitus without complications: Secondary | ICD-10-CM | POA: Insufficient documentation

## 2015-09-05 DIAGNOSIS — Z833 Family history of diabetes mellitus: Secondary | ICD-10-CM | POA: Diagnosis not present

## 2015-09-05 DIAGNOSIS — Z85820 Personal history of malignant melanoma of skin: Secondary | ICD-10-CM | POA: Diagnosis not present

## 2015-09-05 DIAGNOSIS — I480 Paroxysmal atrial fibrillation: Secondary | ICD-10-CM | POA: Diagnosis not present

## 2015-09-05 DIAGNOSIS — Z823 Family history of stroke: Secondary | ICD-10-CM | POA: Diagnosis not present

## 2015-09-05 DIAGNOSIS — Z7901 Long term (current) use of anticoagulants: Secondary | ICD-10-CM | POA: Insufficient documentation

## 2015-09-05 DIAGNOSIS — Z79899 Other long term (current) drug therapy: Secondary | ICD-10-CM | POA: Insufficient documentation

## 2015-09-05 DIAGNOSIS — G8929 Other chronic pain: Secondary | ICD-10-CM | POA: Insufficient documentation

## 2015-09-05 DIAGNOSIS — J45909 Unspecified asthma, uncomplicated: Secondary | ICD-10-CM | POA: Diagnosis not present

## 2015-09-05 DIAGNOSIS — Z87891 Personal history of nicotine dependence: Secondary | ICD-10-CM | POA: Insufficient documentation

## 2015-09-05 DIAGNOSIS — Z8546 Personal history of malignant neoplasm of prostate: Secondary | ICD-10-CM | POA: Insufficient documentation

## 2015-09-05 DIAGNOSIS — I2510 Atherosclerotic heart disease of native coronary artery without angina pectoris: Secondary | ICD-10-CM | POA: Insufficient documentation

## 2015-09-05 DIAGNOSIS — M545 Low back pain: Secondary | ICD-10-CM | POA: Diagnosis not present

## 2015-09-05 DIAGNOSIS — M1990 Unspecified osteoarthritis, unspecified site: Secondary | ICD-10-CM | POA: Diagnosis not present

## 2015-09-05 DIAGNOSIS — G4733 Obstructive sleep apnea (adult) (pediatric): Secondary | ICD-10-CM | POA: Insufficient documentation

## 2015-09-05 DIAGNOSIS — K5909 Other constipation: Secondary | ICD-10-CM | POA: Diagnosis not present

## 2015-09-05 DIAGNOSIS — I10 Essential (primary) hypertension: Secondary | ICD-10-CM | POA: Insufficient documentation

## 2015-09-05 HISTORY — PX: TEE WITHOUT CARDIOVERSION: SHX5443

## 2015-09-05 LAB — GLUCOSE, CAPILLARY: GLUCOSE-CAPILLARY: 212 mg/dL — AB (ref 65–99)

## 2015-09-05 SURGERY — ECHOCARDIOGRAM, TRANSESOPHAGEAL
Anesthesia: Moderate Sedation

## 2015-09-05 MED ORDER — SODIUM CHLORIDE 0.9 % IV SOLN: INTRAVENOUS | Status: DC

## 2015-09-05 MED ORDER — MIDAZOLAM HCL 10 MG/2ML IJ SOLN
INTRAMUSCULAR | Status: DC | PRN
  Administered 2015-09-05 (×2): 2 mg via INTRAVENOUS

## 2015-09-05 MED ORDER — SODIUM CHLORIDE 0.9 % IV SOLN
INTRAVENOUS | Status: DC
  Administered 2015-09-05: 500 mL via INTRAVENOUS

## 2015-09-05 MED ORDER — FENTANYL CITRATE (PF) 100 MCG/2ML IJ SOLN
INTRAMUSCULAR | Status: AC
  Filled 2015-09-05: qty 2

## 2015-09-05 MED ORDER — FENTANYL CITRATE (PF) 100 MCG/2ML IJ SOLN
INTRAMUSCULAR | Status: DC | PRN
  Administered 2015-09-05: 12.5 ug via INTRAVENOUS
  Administered 2015-09-05: 25 ug via INTRAVENOUS

## 2015-09-05 MED ORDER — MIDAZOLAM HCL 5 MG/ML IJ SOLN
INTRAMUSCULAR | Status: AC
  Filled 2015-09-05: qty 2

## 2015-09-05 MED ORDER — BUTAMBEN-TETRACAINE-BENZOCAINE 2-2-14 % EX AERO
INHALATION_SPRAY | CUTANEOUS | Status: DC | PRN
  Administered 2015-09-05: 2 via TOPICAL

## 2015-09-05 NOTE — Progress Notes (Signed)
TEE procedure cancelled by Dr. Fransico Him secondary to "strong gag reflex" and drop in O2 Sats with light sedation secondary to OSA

## 2015-09-05 NOTE — H&P (Signed)
Expand All Collapse All         Date: 09/05/2015   ID: [name], DOB [birth year], MRN [id number]  PCP: Lucas Bellow, MD Cardiologist: Dr Gwenlyn Found Primary Electrophysiologist: Thompson Grayer, MD   Chief Complaint  Patient presents with  . Persistent AFIB    History of Present Illness: Lucas Dudley is a 74 y.o. male who presents today for elective TEE prior to undergoing afib ablation. He has a h/o persistent afib that was in the hospital, 7/20-7/23, for tikosyn loading. Pt did have prolonged QTc and dose was reduced to 125 mcg q 12 hours. Qtc remained stable on that dose. [name] He did see Dr. Gwenlyn Found after d/c from hospital and he was in afib. . Metoprolol was changed to carvedilol per Dr. Gwenlyn Found for LV dysfunction.[name] He was on an up titration of carvedilol for LV dysfunction but after placed on 12.5 mg bid he c/o of feeling bad and more short of breath. He was reduced back to 6.25 mg bid with improvement of symptoms. V rates remain elevated at times. His EF is reduced. With AF, he has SOB and fatigue.  He was recently evaluated by Dr. Rayann Heman for afib ablation and after evaluation and discussion of risks and benefits, decided to proceed. He is here today for TEE prior to undergoing afib ablation.  Past Medical History  Diagnosis Date  . Hypertension   . Glaucoma   . Hyperlipidemia   . OSA on CPAP 2012  . Chronic constipation   . Paroxysmal atrial fibrillation     a. 04/2015 Tikosyn loading, DCCV x 1; b. CHA2DS2VASc = 5-->Eliquis.  . Abnormal nuclear stress test   . Chronic anticoagulation     Eliquis  . CAD (coronary artery disease)     a. Cath 03/17/15 showing 100% ostial D1, 50% prox LAD to mid LAD, 40% RPDA stenosis. Med rx.  [name] NICM (nonischemic cardiomyopathy)   . Kidney stones     "found during prostate OR; still in there as far as I know" (05/04/2015)  . Prostate cancer   . 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 Heart murmur     "all my life"  . Myocardial infarct     "I was told 5-10 yr ago I'd had a MI in my 20's; now they say I didn't" (05/04/2015)  . Childhood asthma   . Pneumonia 1950's X 1  . Chronic bronchitis     "had it several times til I quit smoking"  . Type II diabetes mellitus   . Arthritis     "hands probably" (05/04/2015)  . Chronic lower back pain   . History of gout    Past Surgical History  Procedure Laterality Date  . Shoulder open rotator cuff repair Right X 2  . Nm myocar perf wall motion  02/21/2012    EF 61% ,EXERCISE 7 METS. exercise stopped due to wheezing and shortness of breathe  . Doppler echocardiography  05/25/2009    EF 50-55%,LA mildly dilated, LV function normal  . Carotid doppler  09/17/2008    rigt and left ICAs 0-49%;mildly abnormal  . Cardiac catheterization N/A 03/17/2015    Procedure: Left Heart Cath and Coronary Angiography; Surgeon: Lorretta Harp, MD; Location: [facility]; Service: Cardiovascular; Laterality: N/A;  . Prostatectomy    . Electrophysiologic study N/A 04/05/2015    Procedure: Cardioversion; Surgeon: Sanda Klein, MD; Location: [facility]; Service: Cardiovascular;  Laterality: N/A;  . Laparoscopic cholecystectomy    . Hernia repair    . Abdominal hernia repair      w/mesh  . Excisional hemorrhoidectomy      "inside and out"  . Fine needle aspiration Right     knee; "drew ~ 1 quart off"  . Melanoma excision Right     "neck"     Current Outpatient Prescriptions  Medication Sig Dispense Refill  . apixaban (ELIQUIS) 5 MG TABS tablet Take 1 tablet (5 mg total) by mouth 2 (two) times daily. 180 tablet 3  . carvedilol (COREG) 12.5 MG tablet Take 25mg  (2 tablets) by mouth in the AM and 12.5mg  (1 tablet) in the PM    .  docusate sodium (COLACE) 100 MG capsule Take 100 mg by mouth daily as needed (CONSTIPATION).     [name] dofetilide (TIKOSYN) 125 MCG capsule Take 3 capsules (375 mcg total) by mouth 2 (two) times daily. 180 capsule 6  . furosemide (LASIX) 20 MG tablet Take 1 tablet (20 mg total) by mouth 2 (two) times daily. 60 tablet 6  . gemfibrozil (LOPID) 600 MG tablet Take 600 mg by mouth at bedtime.     [name] glimepiride (AMARYL) 2 MG tablet Take 2 mg by mouth 2 (two) times daily.     [name] latanoprost (XALATAN) 0.005 % ophthalmic solution Place 1 drop into both eyes at bedtime.    [name] lisinopril (PRINIVIL,ZESTRIL) 20 MG tablet Take 20 mg by mouth at bedtime.    . magnesium oxide (MAG-OX) 400 MG tablet Take 1 tablet (400 mg total) by mouth 2 (two) times daily. 60 tablet 6  . metFORMIN (GLUCOPHAGE) 500 MG tablet Take 1,000 mg by mouth 2 (two) times daily.    . OMEGA-3 FATTY ACIDS PO Take 1,200 mg by mouth daily.    . sodium chloride (OCEAN) 0.65 % SOLN nasal spray Place 1 spray into both nostrils at bedtime as needed for congestion.      No current facility-administered medications for this visit.    Allergies: Tape   Social History: The patient  reports that he quit smoking about 31 years ago. His smoking use included Cigarettes. He has a 54 pack-year smoking history. He has never used smokeless tobacco. He reports that he drinks alcohol. He reports that he does not use illicit drugs.   Family History: The patient's family history includes Diabetes in his father; Healthy in his daughter; Heart attack (age of onset: 27) in his mother; Heart disease in his father and mother; Lung cancer in his mother; Stroke in his brother.    ROS: Please see the history of present illness. All other systems are reviewed and negative.    PHYSICAL EXAM: GEN: Well nourished, well developed, in no acute distress  HEENT: normal  Neck: no JVD, carotid bruits, or  masses Cardiac: irregularly irreguar; no murmurs, rubs, or gallops,no edema  Respiratory: clear to auscultation bilaterally, normal work of breathing GI: soft, nontender, nondistended, + BS MS: no deformity or atrophy  Skin: warm and dry  Neuro: Strength and sensation are intact Psych: euthymic mood, full affect   Recent Labs: 03/11/2015: Hemoglobin 14.7; Platelets 233; TSH 2.663 05/16/2015: BUN 27*; Creatinine, Ser 1.06; Potassium 4.9; Sodium 137 05/26/2015: Magnesium 1.8    Lipid Panel   Labs (Brief)    No results found for: CHOL, TRIG, HDL, CHOLHDL, VLDL, LDLCALC, LDLDIRECT     Wt Readings from Last 3 Encounters:  07/01/15 238 lb 12.8 oz (108.319 kg)  06/14/15 237 lb 6.4 oz (107.684 kg)  06/06/15 236 lb 3.2 oz (107.14 kg)      Other studies Reviewed: Additional studies/ records that were reviewed today include: AF clinic notes, Dr Rachel Bo notes, prior cath and echo   ASSESSMENT AND PLAN:  1. Persistent atrial fibrillation The patient has symptomatic atrial fibrillation. He has failed medical therapy with tikosyn. He has a depressed EF and will likely do better with sinus rhythm long term. His chads2vasc score is at least 5. He is appropriately anticoagulated with eliquis at this time.   2. Nonischemic CM/ chronic systolic dysfunction Would favor ongoing medical therapy and then to reassess EF once in sinus post ablation. Continue current medical therapy  3. HTN Stable No change required today  4. CAD No ischemic symptoms  5. OSA Followed by Dr Claiborne Billings  Current medicines are reviewed at length with the patient today.  The patient does not have concerns regarding his medicines. The following changes were made today: none        Signed: Fransico Him, MD [facility] 09/05/2015

## 2015-09-05 NOTE — Telephone Encounter (Signed)
Spoke with daughter and let her know Eliquis tonight and not tomorrow morning

## 2015-09-05 NOTE — CV Procedure (Signed)
    PROCEDURE NOTE:  Procedure:  Transesophageal echocardiogram Operator:  Fransico Him, MD Indications:  Atrial fibrillation pre ablation Complications: None IV Meds:Versed 4mg , Fentanyl 37.5mg  IV  Results: The patient was sedated to the degree that he was snoring.  He had episodes of oxygen desaturations as low as 90% on Oxygen at 2L [place].  Attempts at esophageal intubation were attempted but the patient had a very strong gag reflex and became extremely agitated.  His oxygen sats dropped to 88%.  The probe was removed and patient's saturations improved.  He was given another 12.5mg  of Fentayl and patient was sedated again snoring.  Oxygen saturations were in the low 90's.  Attempts as esophageal intubation were attempted again but patient became agitated again and could not pass probe.  Anesthesia was called to assist with sedation but were not available.  The procedure was stopped and plans for TEE in am under anesthesia prior to ablation.  Discussed with Dr. Rayann Heman.   The patient was transferred back to their room in stable condition.  Signed: Fransico Him, MD [facility]

## 2015-09-05 NOTE — Interval H&P Note (Signed)
History and Physical Interval Note:  09/05/2015 9:46 AM  Lucas Dudley  has presented today for surgery, with the diagnosis of AFIB  The various methods of treatment have been discussed with the patient and family. After consideration of risks, benefits and other options for treatment, the patient has consented to  Procedure(s): TRANSESOPHAGEAL ECHOCARDIOGRAM (TEE) (N/A) as a surgical intervention .  The patient's history has been reviewed, patient examined, no change in status, stable for surgery.  I have reviewed the patient's chart and labs.  Questions were answered to the patient's satisfaction.     Odai Wimmer R

## 2015-09-05 NOTE — Discharge Instructions (Signed)
Moderate Conscious Sedation, Adult, Care After  Refer to this sheet in the next few weeks. These instructions provide you with information on caring for yourself after your procedure. Your health care provider may also give you more specific instructions. Your treatment has been planned according to current medical practices, but problems sometimes occur. Call your health care provider if you have any problems or questions after your procedure.  WHAT TO EXPECT AFTER THE PROCEDURE  After your procedure:  You may feel sleepy, clumsy, and have poor balance for several hours.  HOME CARE INSTRUCTIONS  Do not participate in any activities where you could become injured for at least 24 hours. Do not:  Drive.  Swim.  Ride a bicycle.  Operate heavy machinery.  Cook.  Use power tools.  Climb ladders.  Work from a high place.  Do not make important decisions or sign legal documents until you are improved.  If you vomit, drink water, juice, or soup when you can drink without vomiting. Make sure you have little or no nausea before eating solid foods.  Only take over-the-counter or prescription medicines for pain, discomfort, or fever as directed by your health care provider.  Make sure you and your family fully understand everything about the medicines given to you, including what side effects may occur.  You should not drink alcohol, take sleeping pills, or take medicines that cause drowsiness for at least 24 hours.  If you smoke, do not smoke without supervision.  If you are feeling better, you may resume normal activities 24 hours after you were sedated.  Keep all appointments with your health care provider. SEEK MEDICAL CARE IF:  Your skin is pale or bluish in color.  You continue to feel nauseous or vomit.  Your pain is getting worse and is not helped by medicine.  You have bleeding or swelling.  You are still sleepy or feeling clumsy after 24 hours. SEEK IMMEDIATE  MEDICAL CARE IF:  You develop a rash.  You have difficulty breathing.  You develop any type of allergic problem.  You have a fever. MAKE SURE YOU:  Understand these instructions.  Will watch your condition.  Will get help right away if you are not doing well or get worse.   This information is not intended to replace advice given to you by your health care provider. Make sure you discuss any questions you have with your health care provider.   Document Released: 07/22/2013 Document Revised: 10/22/2014 Document Reviewed: 07/22/2013 Elsevier Interactive Patient Education [employer].

## 2015-09-05 NOTE — Anesthesia Preprocedure Evaluation (Addendum)
Anesthesia Evaluation  Patient identified by MRN, date of birth, ID band Patient awake    Reviewed: Allergy & Precautions, NPO status , Patient's Chart, lab work & pertinent test results  Airway Mallampati: II  TM Distance: <3 FB Neck ROM: Full    Dental no notable dental hx. (+) Edentulous Upper, Dental Advisory Given, Upper Dentures, Lower Dentures   Pulmonary neg pulmonary ROS, former smoker,    Pulmonary exam normal breath sounds clear to auscultation       Cardiovascular hypertension, Pt. on medications + CAD and + Past MI  Normal cardiovascular exam+ Valvular Problems/Murmurs  Rhythm:Regular Rate:Normal  Echo 01/2015 - Left ventricle: The cavity size was mildly dilated. Wall thickness was increased in a pattern of mild LVH. The estimatedejection fraction was 40%. Diffuse hypokinesis. Indeterminantdiastolic function (atrial fibrillation). - Aortic valve: There was no stenosis. - Mitral valve: There was trivial regurgitation. - Left atrium: The atrium was mildly dilated. - Right ventricle: The cavity size was normal. Systolic functionwas normal. - Pulmonary arteries: PA peak pressure: 15 mm Hg (S). - Inferior vena cava: The vessel was normal in size. The respirophasic diameter changes were in the normal range (= 50%),consistent with normal central venous pressure.  Impressions: - The patient was in atrial fibrillation with RVR. Mildly dilatedLV with mild LV hypertrophy. EF 40%, diffuse hypokinesis. NormalRV size and systolic function. No significant valvularabnormalities.    Neuro/Psych negative neurological ROS  negative psych ROS   GI/Hepatic negative GI ROS, Neg liver ROS,   Endo/Other  diabetes, Type 2  Renal/GU Renal disease  negative genitourinary   Musculoskeletal negative musculoskeletal ROS (+)   Abdominal   Peds  Hematology negative hematology ROS (+)   Anesthesia Other Findings    Reproductive/Obstetrics negative OB ROS                          Anesthesia Physical Anesthesia Plan  ASA: III  Anesthesia Plan: MAC   Post-op Pain Management:    Induction: Intravenous  Airway Management Planned:   Additional Equipment:   Intra-op Plan:   Post-operative Plan:   Informed Consent: I have reviewed the patients History and Physical, chart, labs and discussed the procedure including the risks, benefits and alternatives for the proposed anesthesia with the patient or authorized representative who has indicated his/her understanding and acceptance.   Dental advisory given  Plan Discussed with: CRNA  Anesthesia Plan Comments:         Anesthesia Quick Evaluation

## 2015-09-05 NOTE — Telephone Encounter (Signed)
New Message   What medications do the pt need to take tonight and in the am pertaining to his procedure

## 2015-09-06 ENCOUNTER — Ambulatory Visit (HOSPITAL_BASED_OUTPATIENT_CLINIC_OR_DEPARTMENT_OTHER): Payer: Medicare Other

## 2015-09-06 ENCOUNTER — Encounter (HOSPITAL_COMMUNITY)
Admission: RE | Disposition: A | Discharge status: Home / Self Care | Payer: Self-pay | Source: Ambulatory Visit | Attending: Internal Medicine

## 2015-09-06 ENCOUNTER — Encounter (HOSPITAL_COMMUNITY): Payer: Self-pay | Admitting: Anesthesiology

## 2015-09-06 ENCOUNTER — Ambulatory Visit (HOSPITAL_COMMUNITY): Payer: Medicare Other | Admitting: Anesthesiology

## 2015-09-06 ENCOUNTER — Ambulatory Visit (HOSPITAL_BASED_OUTPATIENT_CLINIC_OR_DEPARTMENT_OTHER)
Admission: RE | Admit: 2015-09-06 | Discharge: 2015-09-07 | Disposition: A | Discharge status: Home / Self Care | Payer: Medicare Other | Source: Ambulatory Visit | Attending: Internal Medicine | Admitting: Internal Medicine

## 2015-09-06 DIAGNOSIS — I340 Nonrheumatic mitral (valve) insufficiency: Secondary | ICD-10-CM | POA: Diagnosis not present

## 2015-09-06 DIAGNOSIS — I10 Essential (primary) hypertension: Secondary | ICD-10-CM | POA: Diagnosis not present

## 2015-09-06 DIAGNOSIS — I480 Paroxysmal atrial fibrillation: Secondary | ICD-10-CM | POA: Diagnosis not present

## 2015-09-06 DIAGNOSIS — I481 Persistent atrial fibrillation: Secondary | ICD-10-CM | POA: Diagnosis not present

## 2015-09-06 DIAGNOSIS — G4733 Obstructive sleep apnea (adult) (pediatric): Secondary | ICD-10-CM | POA: Diagnosis present

## 2015-09-06 DIAGNOSIS — I4819 Other persistent atrial fibrillation: Secondary | ICD-10-CM | POA: Diagnosis present

## 2015-09-06 DIAGNOSIS — E785 Hyperlipidemia, unspecified: Secondary | ICD-10-CM | POA: Diagnosis not present

## 2015-09-06 HISTORY — PX: ELECTROPHYSIOLOGIC STUDY: SHX172A

## 2015-09-06 LAB — BASIC METABOLIC PANEL
Anion gap: 8 (ref 5–15)
BUN: 15 mg/dL (ref 6–20)
CALCIUM: 8.6 mg/dL — AB (ref 8.9–10.3)
CO2: 28 mmol/L (ref 22–32)
CREATININE: 0.96 mg/dL (ref 0.61–1.24)
Chloride: 105 mmol/L (ref 101–111)
GFR calc non Af Amer: >60 mL/min (ref >60)
GLUCOSE: 168 mg/dL — AB (ref 65–99)
Potassium: 4.5 mmol/L (ref 3.5–5.1)
Sodium: 141 mmol/L (ref 135–145)

## 2015-09-06 LAB — MRSA PCR SCREENING: MRSA by PCR: NEGATIVE

## 2015-09-06 LAB — GLUCOSE, CAPILLARY
GLUCOSE-CAPILLARY: 139 mg/dL — AB (ref 65–99)
GLUCOSE-CAPILLARY: 165 mg/dL — AB (ref 65–99)
Glucose-Capillary: 158 mg/dL — ABNORMAL HIGH (ref 65–99)

## 2015-09-06 LAB — POCT ACTIVATED CLOTTING TIME
ACTIVATED CLOTTING TIME: 177 s
Activated Clotting Time: 239 seconds
Activated Clotting Time: 264 seconds

## 2015-09-06 LAB — MAGNESIUM: Magnesium: 1.6 mg/dL — ABNORMAL LOW (ref 1.7–2.4)

## 2015-09-06 SURGERY — ATRIAL FIBRILLATION ABLATION
Anesthesia: Monitor Anesthesia Care

## 2015-09-06 MED ORDER — SUCCINYLCHOLINE CHLORIDE 20 MG/ML IJ SOLN
INTRAMUSCULAR | Status: DC | PRN
  Administered 2015-09-06: 120 mg via INTRAVENOUS

## 2015-09-06 MED ORDER — PROMETHAZINE HCL 25 MG/ML IJ SOLN: 6.2500 mg | INTRAMUSCULAR | Status: DC | PRN

## 2015-09-06 MED ORDER — LISINOPRIL 20 MG PO TABS
20.0000 mg | ORAL_TABLET | Freq: Every day | ORAL | Status: DC
  Administered 2015-09-06: 20 mg via ORAL
  Filled 2015-09-06: qty 1

## 2015-09-06 MED ORDER — EPHEDRINE SULFATE 50 MG/ML IJ SOLN
INTRAMUSCULAR | Status: DC | PRN
  Administered 2015-09-06: 10 mg via INTRAVENOUS
  Administered 2015-09-06: 5 mg via INTRAVENOUS
  Administered 2015-09-06: 10 mg via INTRAVENOUS
  Administered 2015-09-06: 5 mg via INTRAVENOUS
  Administered 2015-09-06 (×2): 10 mg via INTRAVENOUS

## 2015-09-06 MED ORDER — HEPARIN SODIUM (PORCINE) 1000 UNIT/ML IJ SOLN
INTRAMUSCULAR | Status: DC | PRN
  Administered 2015-09-06 (×2): 4000 [IU] via INTRAVENOUS

## 2015-09-06 MED ORDER — HEPARIN SODIUM (PORCINE) 1000 UNIT/ML IJ SOLN
INTRAMUSCULAR | Status: AC
  Filled 2015-09-06: qty 1

## 2015-09-06 MED ORDER — SODIUM CHLORIDE 0.9 % IV SOLN: 250.0000 mL | INTRAVENOUS | Status: DC | PRN

## 2015-09-06 MED ORDER — DOFETILIDE 250 MCG PO CAPS
375.0000 ug | ORAL_CAPSULE | Freq: Two times a day (BID) | ORAL | Status: DC
  Administered 2015-09-06 – 2015-09-07 (×2): 375 ug via ORAL
  Filled 2015-09-06 (×4): qty 1

## 2015-09-06 MED ORDER — BUPIVACAINE HCL (PF) 0.25 % IJ SOLN
INTRAMUSCULAR | Status: AC
Start: 2015-09-06 — End: 2015-09-06
  Filled 2015-09-06: qty 30

## 2015-09-06 MED ORDER — LACTATED RINGERS IV SOLN
INTRAVENOUS | Status: DC | PRN
  Administered 2015-09-06: 11:00:00 via INTRAVENOUS

## 2015-09-06 MED ORDER — PROTAMINE SULFATE 10 MG/ML IV SOLN
INTRAVENOUS | Status: DC | PRN
  Administered 2015-09-06 (×3): 10 mg via INTRAVENOUS

## 2015-09-06 MED ORDER — LATANOPROST 0.005 % OP SOLN
1.0000 [drp] | Freq: Every day | OPHTHALMIC | Status: DC
  Administered 2015-09-06: 1 [drp] via OPHTHALMIC
  Filled 2015-09-06: qty 2.5

## 2015-09-06 MED ORDER — PROPOFOL 10 MG/ML IV BOLUS
INTRAVENOUS | Status: DC | PRN
  Administered 2015-09-06: 150 mg via INTRAVENOUS
  Administered 2015-09-06 (×2): 20 mg via INTRAVENOUS

## 2015-09-06 MED ORDER — HEPARIN SODIUM (PORCINE) 1000 UNIT/ML IJ SOLN
INTRAMUSCULAR | Status: DC | PRN
  Administered 2015-09-06: 12000 [IU] via INTRAVENOUS

## 2015-09-06 MED ORDER — SODIUM CHLORIDE 0.9 % IV SOLN
INTRAVENOUS | Status: DC
Start: 2015-09-06 — End: 2015-09-06
  Administered 2015-09-06 (×2): via INTRAVENOUS

## 2015-09-06 MED ORDER — MEPERIDINE HCL 25 MG/ML IJ SOLN: 6.2500 mg | INTRAMUSCULAR | Status: DC | PRN

## 2015-09-06 MED ORDER — HYDROMORPHONE HCL 1 MG/ML IJ SOLN: 0.2500 mg | INTRAMUSCULAR | Status: DC | PRN

## 2015-09-06 MED ORDER — ACETAMINOPHEN 325 MG PO TABS
650.0000 mg | ORAL_TABLET | ORAL | Status: DC | PRN
  Administered 2015-09-07: 650 mg via ORAL
  Filled 2015-09-06: qty 2

## 2015-09-06 MED ORDER — PHENYLEPHRINE HCL 10 MG/ML IJ SOLN
INTRAMUSCULAR | Status: DC | PRN
  Administered 2015-09-06: 40 ug via INTRAVENOUS

## 2015-09-06 MED ORDER — BUPIVACAINE HCL (PF) 0.25 % IJ SOLN
INTRAMUSCULAR | Status: DC | PRN
  Administered 2015-09-06: 15 mL

## 2015-09-06 MED ORDER — ONDANSETRON HCL 4 MG/2ML IJ SOLN
INTRAMUSCULAR | Status: DC | PRN
  Administered 2015-09-06: 4 mg via INTRAVENOUS

## 2015-09-06 MED ORDER — LIDOCAINE HCL (CARDIAC) 20 MG/ML IV SOLN
INTRAVENOUS | Status: DC | PRN
  Administered 2015-09-06: 60 mg via INTRAVENOUS

## 2015-09-06 MED ORDER — MIDAZOLAM HCL 5 MG/5ML IJ SOLN
INTRAMUSCULAR | Status: DC | PRN
  Administered 2015-09-06: 2 mg via INTRAVENOUS

## 2015-09-06 MED ORDER — ARTIFICIAL TEARS OP OINT
TOPICAL_OINTMENT | OPHTHALMIC | Status: DC | PRN
  Administered 2015-09-06: 1 via OPHTHALMIC

## 2015-09-06 MED ORDER — FUROSEMIDE 20 MG PO TABS
20.0000 mg | ORAL_TABLET | Freq: Two times a day (BID) | ORAL | Status: DC
  Administered 2015-09-06 – 2015-09-07 (×2): 20 mg via ORAL
  Filled 2015-09-06 (×2): qty 1

## 2015-09-06 MED ORDER — ONDANSETRON HCL 4 MG/2ML IJ SOLN: 4.0000 mg | Freq: Four times a day (QID) | INTRAMUSCULAR | Status: DC | PRN

## 2015-09-06 MED ORDER — APIXABAN 5 MG PO TABS
5.0000 mg | ORAL_TABLET | Freq: Two times a day (BID) | ORAL | Status: DC
  Administered 2015-09-06 – 2015-09-07 (×2): 5 mg via ORAL
  Filled 2015-09-06 (×2): qty 1

## 2015-09-06 MED ORDER — SODIUM CHLORIDE 0.9 % IJ SOLN
3.0000 mL | Freq: Two times a day (BID) | INTRAMUSCULAR | Status: DC
  Administered 2015-09-06: 3 mL via INTRAVENOUS

## 2015-09-06 MED ORDER — IOHEXOL 350 MG/ML SOLN
INTRAVENOUS | Status: DC | PRN
  Administered 2015-09-06: 114 mL via INTRACARDIAC

## 2015-09-06 MED ORDER — MAGNESIUM OXIDE 400 (241.3 MG) MG PO TABS
400.0000 mg | ORAL_TABLET | Freq: Two times a day (BID) | ORAL | Status: DC
  Administered 2015-09-06 – 2015-09-07 (×2): 400 mg via ORAL
  Filled 2015-09-06 (×2): qty 1

## 2015-09-06 MED ORDER — CARVEDILOL 12.5 MG PO TABS
12.5000 mg | ORAL_TABLET | Freq: Two times a day (BID) | ORAL | Status: DC
  Administered 2015-09-07: 12.5 mg via ORAL
  Filled 2015-09-06: qty 1

## 2015-09-06 MED ORDER — SODIUM CHLORIDE 0.9 % IJ SOLN: 3.0000 mL | INTRAMUSCULAR | Status: DC | PRN

## 2015-09-06 MED ORDER — GLIMEPIRIDE 2 MG PO TABS
2.0000 mg | ORAL_TABLET | Freq: Two times a day (BID) | ORAL | Status: DC
  Administered 2015-09-06 – 2015-09-07 (×2): 2 mg via ORAL
  Filled 2015-09-06 (×3): qty 1

## 2015-09-06 MED ORDER — FENTANYL CITRATE (PF) 100 MCG/2ML IJ SOLN
INTRAMUSCULAR | Status: DC | PRN
  Administered 2015-09-06 (×4): 50 ug via INTRAVENOUS

## 2015-09-06 MED ORDER — HYDROCODONE-ACETAMINOPHEN 5-325 MG PO TABS: 1.0000 | ORAL_TABLET | ORAL | Status: DC | PRN

## 2015-09-06 MED ORDER — MAGNESIUM SULFATE 2 GM/50ML IV SOLN
2.0000 g | Freq: Once | INTRAVENOUS | Status: AC
  Administered 2015-09-06: 2 g via INTRAVENOUS
  Filled 2015-09-06: qty 50

## 2015-09-06 SURGICAL SUPPLY — 20 items
BAG SNAP BAND KOVER 36X36 (MISCELLANEOUS) ×4 IMPLANT
BLANKET WARM UNDERBOD FULL ACC (MISCELLANEOUS) ×2 IMPLANT
CATH DIAG 6FR PIGTAIL (CATHETERS) ×2 IMPLANT
CATH NAVISTAR SMARTTOUCH DF (ABLATOR) ×2 IMPLANT
CATH VARIABLE LASSO NAV 2515 (CATHETERS) ×2 IMPLANT
CATH WEBSTER BI DIR CS D-F CRV (CATHETERS) ×2 IMPLANT
COVER SWIFTLINK CONNECTOR (BAG) ×2 IMPLANT
INTRODUCER SWARTZ SL2 8F (SHEATH) ×2 IMPLANT
NEEDLE TRANSEP BRK 71CM 407200 (NEEDLE) ×2 IMPLANT
PACK EP LATEX FREE (CUSTOM PROCEDURE TRAY) ×1
PACK EP LF (CUSTOM PROCEDURE TRAY) ×1 IMPLANT
PAD DEFIB LIFELINK (PAD) ×2 IMPLANT
PATCH CARTO3 (PAD) ×2 IMPLANT
SHEATH AVANTI 11F 11CM (SHEATH) ×2 IMPLANT
SHEATH PINNACLE 7F 10CM (SHEATH) ×4 IMPLANT
SHEATH PINNACLE 9F 10CM (SHEATH) ×2 IMPLANT
SHIELD RADPAD SCOOP 12X17 (MISCELLANEOUS) ×2 IMPLANT
SYR MEDRAD MARK V 150ML (SYRINGE) ×2 IMPLANT
TUBING CONTRAST HIGH PRESS 48 (TUBING) ×2 IMPLANT
TUBING SMART ABLATE COOLFLOW (TUBING) ×2 IMPLANT

## 2015-09-06 NOTE — Anesthesia Procedure Notes (Signed)
Procedure Name: Intubation Date/Time: 09/06/2015 7:40 AM Performed by: Suzy Bouchard Pre-anesthesia Checklist: Patient identified, Timeout performed, Emergency Drugs available, Suction available and Patient being monitored Patient Re-evaluated:Patient Re-evaluated prior to inductionOxygen Delivery Method: Circle system utilized Preoxygenation: Pre-oxygenation with 100% oxygen Intubation Type: IV induction Ventilation: Mask ventilation without difficulty Laryngoscope Size: Miller and 2 Grade View: Grade I Tube type: Oral Tube size: 7.5 mm Number of attempts: 1 Airway Equipment and Method: Stylet Secured at: 22 cm Tube secured with: Tape Dental Injury: Teeth and Oropharynx as per pre-operative assessment

## 2015-09-06 NOTE — Discharge Summary (Signed)
ELECTROPHYSIOLOGY PROCEDURE DISCHARGE SUMMARY    Patient ID: Lucas Dudley,  MRN: [id number], DOB/AGE: 1941/07/05 74 y.o.  Admit date: 09/06/2015 Discharge date: 09/07/2015  Primary Care Physician: Robert Bellow, MD Primary Cardiologist: Gwenlyn Found Electrophysiologist: Thomas Hoff, MD  Primary Discharge Diagnosis:  Persistent atrial fibrillation status post ablation this admission  Secondary Discharge Diagnosis:  1.  Hypertension 2.  CAD 3.  Prior NICM 4.  OSA 5.  Hyperlipidemia  Procedures This Admission:  1.  Electrophysiology study and radiofrequency catheter ablation on 09-06-15 by Dr Thompson Grayer.  This study demonstrated sinus rhythm upon presentation; rotational Angiography reveals a moderate sized left atrium with four separate pulmonary veins without evidence of pulmonary vein stenosis; successful electrical isolation and anatomical encircling of all four pulmonary veins with radiofrequency current. A WACA approach was used; additional left atrial ablation was performed to isolate the posterior wall of the left atrium; atrial fibrillation unsuccessfully cardioverted post ablation; no early apparent complications.    Brief HPI: Lucas Dudley is a 74 y.o. male with a history of persistent atrial fibrillation.  They have failed medical therapy with Tikosyn. Risks, benefits, and alternatives to catheter ablation of atrial fibrillation were reviewed with the patient who wished to proceed.  The patient underwent TEE prior to the procedure which demonstrated normal LV function and no LAA thrombus.    Hospital Course:  The patient was admitted and underwent EPS/RFCA of atrial fibrillation with details as outlined above.  They were monitored on telemetry overnight which demonstrated afib which has converted to sinus rhythm this morning.  Groin was without complication on the day of discharge.  The patient was examined and considered to be stable for discharge.  Wound care and  restrictions were reviewed with the patient.  The patient will be seen back by Roderic Palau, NP in 4 weeks and Dr Rayann Heman in 12 weeks for post ablation follow up.   This patients CHA2DS2-VASc Score and unadjusted Ischemic Stroke Rate (% per year) is equal to 4.8 % stroke rate/year from a score of 4 Above score calculated as 1 point each if present [CHF, HTN, DM, Vascular=MI/PAD/Aortic Plaque, Age if 65-74, or Male] Above score calculated as 2 points each if present [Age > 75, or Stroke/TIA/TE]   Physical Exam: Filed Vitals:   09/07/15 0330 09/07/15 0343 09/07/15 0400 09/07/15 0745  BP: 114/69   110/66  Pulse: 55 81  57  Temp:   98.7 F (37.1 C) 98.1 F (36.7 C)  TempSrc:   Oral Oral  Resp:      SpO2: 92% 95%  98%    GEN- The patient is well appearing, alert and oriented x 3 today.   HEENT: normocephalic, atraumatic; sclera clear, conjunctiva pink; hearing intact; oropharynx clear; neck supple  Lungs- Clear to ausculation bilaterally, normal work of breathing.  No wheezes, rales, rhonchi Heart- Regular rate and rhythm, no murmurs, rubs or gallops  GI- soft, non-tender, non-distended, bowel sounds present  Extremities- no clubbing, cyanosis, or edema; DP/PT/radial pulses 2+ bilaterally, groin without hematoma/bruit MS- no significant deformity or atrophy Skin- warm and dry, no rash or lesion Psych- euthymic mood, full affect Neuro- strength and sensation are intact   Labs:   Lab Results  Component Value Date   WBC 6.8 08/30/2015   HGB 15.0 08/30/2015   HCT 43.1 08/30/2015   MCV 87.6 08/30/2015   PLT 233 08/30/2015     Recent Labs Lab 09/07/15 0253  NA 139  K 3.8  CL  102  CO2 28  BUN 13  CREATININE 0.93  CALCIUM 8.5*  GLUCOSE 160*     Discharge Medications:    Medication List    TAKE these medications        apixaban 5 MG Tabs tablet  Commonly known as:  ELIQUIS  Take 1 tablet (5 mg total) by mouth 2 (two) times daily.     carvedilol 12.5 MG tablet    Commonly known as:  COREG  TAKE 1 TABLET (12.5 MG TOTAL) BY MOUTH 2 (TWO) TIMES DAILY.     docusate sodium 100 MG capsule  Commonly known as:  COLACE  Take 100 mg by mouth daily as needed (CONSTIPATION).     dofetilide 125 MCG capsule  Commonly known as:  TIKOSYN  Take 3 capsules (375 mcg total) by mouth 2 (two) times daily.     furosemide 20 MG tablet  Commonly known as:  LASIX  Take 1 tablet (20 mg total) by mouth 2 (two) times daily.     gemfibrozil 600 MG tablet  Commonly known as:  LOPID  Take 600 mg by mouth at bedtime.     glimepiride 2 MG tablet  Commonly known as:  AMARYL  Take 2 mg by mouth 2 (two) times daily.     latanoprost 0.005 % ophthalmic solution  Commonly known as:  XALATAN  Place 1 drop into both eyes at bedtime.     lisinopril 20 MG tablet  Commonly known as:  PRINIVIL,ZESTRIL  Take 20 mg by mouth at bedtime.     magnesium oxide 400 MG tablet  Commonly known as:  MAG-OX  Take 1 tablet (400 mg total) by mouth 2 (two) times daily.     metFORMIN 500 MG tablet  Commonly known as:  GLUCOPHAGE  Take 1,000 mg by mouth 2 (two) times daily.     OMEGA-3 FATTY ACIDS PO  Take 1,200 mg by mouth daily.     pantoprazole 40 MG tablet  Commonly known as:  PROTONIX  Take 1 tablet (40 mg total) by mouth daily.     sodium chloride 0.65 % Soln nasal spray  Commonly known as:  OCEAN  Place 1 spray into both nostrils at bedtime as needed for congestion.        Disposition:   Follow-up Information    Follow up with [facility] On 10/11/2015.   Specialty:  Cardiology   Why:  at 9:30AM   Contact information:   [id number] [place] [phone]      Follow up with Thompson Grayer, MD On 12/07/2015.   Specialty:  Cardiology   Why:  at 9:45AM   Contact information:   [place] [address] [phone]       Duration of Discharge Encounter: Greater than  30 minutes including physician time.  Army Fossa MD  09/07/2015 7:55 AM

## 2015-09-06 NOTE — Interval H&P Note (Signed)
History and Physical Interval Note:  09/06/2015 8:07 AM  Lucas Dudley  has presented today for surgery, with the diagnosis of afib  The various methods of treatment have been discussed with the patient and family. After consideration of risks, benefits and other options for treatment, the patient has consented to  Procedure(s): Atrial Fibrillation Ablation (N/A) as a surgical intervention .  The patient's history has been reviewed, patient examined, no change in status, stable for surgery.  I have reviewed the patient's chart and labs.  Questions were answered to the patient's satisfaction.     [place] [employer]

## 2015-09-06 NOTE — CV Procedure (Signed)
Procedure: TEE  Indication: Pre-atrial fibrillation ablation.  Sedation: General anesthesia  Findings: Please see echo section for full report.  Normal LV size with mild LV hypertrophy.  EF 55%.  There was mild left atrial enlargement.  No LA appendage thrombus.  Normal RV size and systolic function.  Normal right atrial size.  Mild MR, trivial TR, trivial PI.  The aortic valve was trileaflet with no AS or AI.  The aorta was normal in caliber with mild plaque in the descending thoracic aorta.   Will proceed on with ablation.   Loralie Champagne 09/06/2015 8:21 AM

## 2015-09-06 NOTE — Progress Notes (Signed)
Site area: 3 rt fv sheaths pulled and pressure held by [employer] Site Prior to Removal:  Level  0 Pressure Applied For:  20 minutes Manual:   yes Patient Status During Pull:  stable Post Pull Site:  Level  0 Post Pull Instructions Given:  yes Post Pull Pulses Present: yes Dressing Applied:  yes Bedrest begins @  1300 Comments:  IV saline locked

## 2015-09-06 NOTE — Progress Notes (Signed)
  Echocardiogram 2D Echocardiogram has been performed.  Lucas Dudley 09/06/2015, 9:18 AM

## 2015-09-06 NOTE — H&P (Signed)
Chief Complaint  Patient presents with  . Persistent AFIB    History of Present Illness: Lucas Dudley is a 74 y.o. male who presents today for afib ablation. He has a h/o persistent afib that was in the hospital, 7/20-7/23, for tikosyn loading. Pt did have prolonged QTc and dose was reduced to 125 mcg q 12 hours. Qtc remained stable on that dose. [name] He did see Dr. Gwenlyn Found after d/c from hospital and he was in afib. He does feel improved energy and less dyspnea with exertion in SR. Metoprolol was changed to carvedilol per Dr. Leonard Downing LV dysfunction.. He has been on an up titration of carvedilol for LV dysfunction but after placed on 12.5 mg bid he c/o of feeling bad and more short of breath. He was reduced back to 6.25 mg bid with improvement of symptoms. V rates remain elevated at times. His EF is reduced. With AF, he has SOB and fatigue. TEE was attempted yesterday but due to severe OSA and difficulty with sedation, the procedure could not be completed.  Today, he denies symptoms of palpitations, chest pain, orthopnea, PND, lower extremity edema, claudication, dizziness, presyncope, syncope, bleeding, or neurologic sequela. The patient is tolerating medications without difficulties and is otherwise without complaint today.    Past Medical History  Diagnosis Date  . Hypertension   . Glaucoma   . Hyperlipidemia   . OSA on CPAP 2012  . Chronic constipation   . Paroxysmal atrial fibrillation     a. 04/2015 Tikosyn loading, DCCV x 1; b. CHA2DS2VASc = 5-->Eliquis.  . Abnormal nuclear stress test   . Chronic anticoagulation     Eliquis  . CAD (coronary artery disease)     a. Cath 03/17/15 showing 100% ostial D1, 50% prox LAD to mid LAD, 40% RPDA stenosis. Med rx.  [name] NICM (nonischemic cardiomyopathy)   . Kidney stones     "found during prostate OR; still in there as far as I know" (05/04/2015)  . Prostate cancer   . 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 Heart murmur     "all my life"  . Myocardial infarct     "I was told 5-10 yr ago I'd had a MI in my 20's; now they say I didn't" (05/04/2015)  . Childhood asthma   . Pneumonia 1950's X 1  . Chronic bronchitis     "had it several times til I quit smoking"  . Type II diabetes mellitus   . Arthritis     "hands probably" (05/04/2015)  . Chronic lower back pain   . History of gout    Past Surgical History  Procedure Laterality Date  . Shoulder open rotator cuff repair Right X 2  . Nm myocar perf wall motion  02/21/2012    EF 61% ,EXERCISE 7 METS. exercise stopped due to wheezing and shortness of breathe  . Doppler echocardiography  05/25/2009    EF 50-55%,LA mildly dilated, LV function normal  . Carotid doppler  09/17/2008    rigt and left ICAs 0-49%;mildly abnormal  . Cardiac catheterization N/A 03/17/2015    Procedure: Left Heart Cath and Coronary Angiography; Surgeon: Lorretta Harp, MD; Location: [facility]; Service: Cardiovascular; Laterality: N/A;  . Prostatectomy    . Electrophysiologic study N/A 04/05/2015    Procedure: Cardioversion; Surgeon: Sanda Klein, MD; Location: [facility]; Service: Cardiovascular; Laterality: N/A;  . Laparoscopic cholecystectomy    . Hernia repair    .  Abdominal hernia repair      w/mesh  . Excisional hemorrhoidectomy      "inside and out"  . Fine needle aspiration Right     knee; "drew ~ 1 quart off"  . Melanoma excision Right     "neck"     Current Outpatient Prescriptions  Medication Sig Dispense Refill  . apixaban (ELIQUIS) 5 MG TABS tablet Take 1 tablet (5 mg total) by mouth 2 (two) times daily. 180 tablet 3  . carvedilol (COREG) 12.5 MG tablet Take 25mg  (2 tablets) by mouth in the AM and 12.5mg  (1 tablet) in the PM    .  docusate sodium (COLACE) 100 MG capsule Take 100 mg by mouth daily as needed (CONSTIPATION).     [name] dofetilide (TIKOSYN) 125 MCG capsule Take 3 capsules (375 mcg total) by mouth 2 (two) times daily. 180 capsule 6  . furosemide (LASIX) 20 MG tablet Take 1 tablet (20 mg total) by mouth 2 (two) times daily. 60 tablet 6  . gemfibrozil (LOPID) 600 MG tablet Take 600 mg by mouth at bedtime.     [name] glimepiride (AMARYL) 2 MG tablet Take 2 mg by mouth 2 (two) times daily.     [name] latanoprost (XALATAN) 0.005 % ophthalmic solution Place 1 drop into both eyes at bedtime.    [name] lisinopril (PRINIVIL,ZESTRIL) 20 MG tablet Take 20 mg by mouth at bedtime.    . magnesium oxide (MAG-OX) 400 MG tablet Take 1 tablet (400 mg total) by mouth 2 (two) times daily. 60 tablet 6  . metFORMIN (GLUCOPHAGE) 500 MG tablet Take 1,000 mg by mouth 2 (two) times daily.    . OMEGA-3 FATTY ACIDS PO Take 1,200 mg by mouth daily.    . sodium chloride (OCEAN) 0.65 % SOLN nasal spray Place 1 spray into both nostrils at bedtime as needed for congestion.      No current facility-administered medications for this visit.    Allergies: Tape   Social History: The patient  reports that he quit smoking about 31 years ago. His smoking use included Cigarettes. He has a 54 pack-year smoking history. He has never used smokeless tobacco. He reports that he drinks alcohol. He reports that he does not use illicit drugs.   Family History: The patient's family history includes Diabetes in his father; Healthy in his daughter; Heart attack (age of onset: 46) in his mother; Heart disease in his father and mother; Lung cancer in his mother; Stroke in his brother.    ROS: Please see the history of present illness. All other systems are reviewed and negative.    PHYSICAL EXAM: Vitals are pending at time of note GEN: Well nourished, well developed, in no acute distress  HEENT: normal  Neck: no  JVD, carotid bruits, or masses Cardiac: RRR; no murmurs, rubs, or gallops,no edema  Respiratory: clear to auscultation bilaterally, normal work of breathing GI: soft, nontender, nondistended, + BS MS: no deformity or atrophy  Skin: warm and dry  Neuro: Strength and sensation are intact Psych: euthymic mood, full affect   Lipid Panel   Labs (Brief)    No results found for: CHOL, TRIG, HDL, CHOLHDL, VLDL, LDLCALC, LDLDIRECT   ASSESSMENT AND PLAN:  1. Persistent atrial fibrillation The patient has symptomatic atrial fibrillation. He has failed medical therapy with tikosyn. He has a depressed EF and will likely do better with sinus rhythm long term. His chads2vasc score is 5. He is appropriately anticoagulated with eliquis at this time.  TEE  could not be performed yesterday due to severe OSA and difficulty with sedation.  He reports compliance with eliquis without interruption. Risk, benefits, and alternatives to Transesophageal echocardiogram (intra op) as well as EP study and radiofrequency ablation for afib were also discussed in detail today. These risks include but are not limited to stroke, bleeding, vascular damage, tamponade, perforation, damage to the esophagus, lungs, and other structures, pulmonary vein stenosis, worsening renal function, and death. The patient understands these risk and wishes to proceed.   2. Severe OSA Compliance with CPAP encouraged  Thompson Grayer MD, [facility] 09/06/2015 7:18 AM

## 2015-09-06 NOTE — Transfer of Care (Signed)
Immediate Anesthesia Transfer of Care Note  Patient: Lucas Dudley  Procedure(s) Performed: Procedure(s): Atrial Fibrillation Ablation (N/A)  Patient Location: Cath Lab  Anesthesia Type:General  Level of Consciousness: awake, alert  and oriented  Airway & Oxygen Therapy: Patient Spontanous Breathing and Patient connected to face mask oxygen  Post-op Assessment: Report given to RN, Post -op Vital signs reviewed and stable and Patient moving all extremities  Post vital signs: Reviewed and stable  Last Vitals: There were no vitals filed for this visit.  Complications: No apparent anesthesia complications

## 2015-09-06 NOTE — Anesthesia Postprocedure Evaluation (Signed)
Anesthesia Post Note  Patient: Lucas Dudley  Procedure(s) Performed: Procedure(s) (LRB): Atrial Fibrillation Ablation (N/A)  Patient location during evaluation: PACU Anesthesia Type: General Level of consciousness: sedated and patient cooperative Pain management: pain level controlled Vital Signs Assessment: post-procedure vital signs reviewed and stable Respiratory status: spontaneous breathing Cardiovascular status: stable Anesthetic complications: no    Last Vitals: There were no vitals filed for this visit.  Last Pain: There were no vitals filed for this visit.               Nolon Nations

## 2015-09-06 NOTE — Discharge Instructions (Signed)
You have an appointment set up with the Atrial Fibrillation Clinic.  Multiple studies have shown that being followed by a dedicated atrial fibrillation clinic in addition to the standard care you receive from your other physicians improves health. We believe that enrollment in the atrial fibrillation clinic will allow us to better care for you.  ° °The phone number to the Atrial Fibrillation Clinic is [phone]. The clinic is staffed Monday through Friday from 8:30am to 5pm. ° °Parking Directions: The clinic is located in the Heart and Vascular Building connected to [facility]. °1)From [address] turn on to [address] and go to the 3rd entrance  (Heart and Vascular entrance) on the right. °2)Look to the right for Heart &Vascular Parking Garage. °3)A code for the entrance is required please call the clinic to receive this.   °4)Take the elevators to the 1st floor. Registration is in the room with the glass walls at the end of the hallway. ° °If you have any trouble parking or locating the clinic, please don’t hesitate to call [phone]. ° °No driving for 4 days. No lifting over 5 lbs for 1 week. No sexual activity for 1 week. You may return to work in 1 week. Keep procedure site clean & dry. If you notice increased pain, swelling, bleeding or pus, call/return!  You may shower, but no soaking baths/hot tubs/pools for 1 week.  ° °

## 2015-09-07 DIAGNOSIS — E785 Hyperlipidemia, unspecified: Secondary | ICD-10-CM | POA: Diagnosis not present

## 2015-09-07 DIAGNOSIS — I481 Persistent atrial fibrillation: Secondary | ICD-10-CM

## 2015-09-07 DIAGNOSIS — I10 Essential (primary) hypertension: Secondary | ICD-10-CM | POA: Diagnosis not present

## 2015-09-07 DIAGNOSIS — I480 Paroxysmal atrial fibrillation: Secondary | ICD-10-CM | POA: Diagnosis not present

## 2015-09-07 DIAGNOSIS — G4733 Obstructive sleep apnea (adult) (pediatric): Secondary | ICD-10-CM | POA: Diagnosis not present

## 2015-09-07 LAB — BASIC METABOLIC PANEL
Anion gap: 9 (ref 5–15)
BUN: 13 mg/dL (ref 6–20)
CALCIUM: 8.5 mg/dL — AB (ref 8.9–10.3)
CO2: 28 mmol/L (ref 22–32)
CREATININE: 0.93 mg/dL (ref 0.61–1.24)
Chloride: 102 mmol/L (ref 101–111)
GFR calc Af Amer: >60 mL/min (ref >60)
GLUCOSE: 160 mg/dL — AB (ref 65–99)
Potassium: 3.8 mmol/L (ref 3.5–5.1)
SODIUM: 139 mmol/L (ref 135–145)

## 2015-09-07 LAB — GLUCOSE, CAPILLARY: Glucose-Capillary: 177 mg/dL — ABNORMAL HIGH (ref 65–99)

## 2015-09-07 LAB — MAGNESIUM: MAGNESIUM: 1.9 mg/dL (ref 1.7–2.4)

## 2015-09-07 MED ORDER — PANTOPRAZOLE SODIUM 40 MG PO TBEC: 40.0000 mg | DELAYED_RELEASE_TABLET | Freq: Every day | ORAL | Status: DC

## 2015-09-07 NOTE — Progress Notes (Signed)
Pt aox4, vss, no c/o of pain, instructions given to pt, wife and pt daughter to include meds, diet, site care, and fallow-up instructions. Pt and family verbalize understanding and agreement with d/c instructions.  Pt release to care of family in no apparent distress.

## 2015-09-19 ENCOUNTER — Telehealth (HOSPITAL_COMMUNITY): Payer: Self-pay | Admitting: *Deleted

## 2015-09-19 NOTE — Telephone Encounter (Signed)
Pt called in stating he was not feeling well "feels like he's been shot" he has no energy.  States his HR is staying in the 40s where it is normally 50-60s when in NSR.  States BP is 130/78 right now.  States he is taking coreg 12.5mg  in the AM and 6.25mg  in the evening (this is different than what is listed on MAR so I had patient read bottle to me over phone).  Instructed him to decrease coreg to 6.25mg  in the morning then call tomorrow afternoon to report symptoms, he will keep track of his BP/HR. Patient verbalized understanding.

## 2015-09-21 ENCOUNTER — Telehealth (HOSPITAL_COMMUNITY): Payer: Self-pay | Admitting: *Deleted

## 2015-09-21 MED ORDER — CARVEDILOL 12.5 MG PO TABS: 6.2500 mg | ORAL_TABLET | Freq: Two times a day (BID) | ORAL | Status: DC

## 2015-09-21 NOTE — Telephone Encounter (Signed)
Pt called back in today stating since decreasing dose of AM coreg to 6.25mg  (taking 6.25mg  bid) he has noticed feeling better - energy and shortness of breath are improved. He does note he went back into afib HR 80-105. He just took morning medications about 15 mins ago. He will call back tomorrow afternoon with trend of BP/HR and report  - as medications may need be adjusted in the interim while in recovery phase of ablation. Pt agreeable.

## 2015-10-11 ENCOUNTER — Ambulatory Visit (HOSPITAL_COMMUNITY)
Admission: RE | Admit: 2015-10-11 | Discharge: 2015-10-11 | Disposition: A | Discharge status: Home / Self Care | Payer: Medicare Other | Source: Ambulatory Visit | Attending: Nurse Practitioner | Admitting: Nurse Practitioner

## 2015-10-11 ENCOUNTER — Encounter (HOSPITAL_COMMUNITY): Payer: Self-pay | Admitting: Nurse Practitioner

## 2015-10-11 VITALS — BP 148/84 | HR 51 | Ht 70.0 in | Wt 243.0 lb

## 2015-10-11 DIAGNOSIS — R001 Bradycardia, unspecified: Secondary | ICD-10-CM | POA: Insufficient documentation

## 2015-10-11 DIAGNOSIS — I4891 Unspecified atrial fibrillation: Secondary | ICD-10-CM | POA: Diagnosis present

## 2015-10-11 DIAGNOSIS — I481 Persistent atrial fibrillation: Secondary | ICD-10-CM

## 2015-10-11 DIAGNOSIS — I4819 Other persistent atrial fibrillation: Secondary | ICD-10-CM

## 2015-10-11 LAB — BASIC METABOLIC PANEL
ANION GAP: 10 (ref 5–15)
BUN: 16 mg/dL (ref 6–20)
CO2: 31 mmol/L (ref 22–32)
Calcium: 9.1 mg/dL (ref 8.9–10.3)
Chloride: 99 mmol/L — ABNORMAL LOW (ref 101–111)
Creatinine, Ser: 1.12 mg/dL (ref 0.61–1.24)
GFR calc Af Amer: >60 mL/min (ref >60)
GFR calc non Af Amer: >60 mL/min (ref >60)
GLUCOSE: 243 mg/dL — AB (ref 65–99)
POTASSIUM: 4.1 mmol/L (ref 3.5–5.1)
Sodium: 140 mmol/L (ref 135–145)

## 2015-10-11 LAB — MAGNESIUM: Magnesium: 1.6 mg/dL — ABNORMAL LOW (ref 1.7–2.4)

## 2015-10-11 NOTE — Progress Notes (Signed)
Patient ID: Lucas Dudley, male   DOB: 10-21-1940, 75 y.o.   MRN: [id number]    Primary Care Physician: Robert Bellow, MD Referring Physician: Dr. Genevie Ann is a 74 y.o. male with a h/o persistent afib on tikosyn and  for one month f/u s/p ablation 11/22. He reports that he has good days and bad days, does not necessarily correlate with SR/afib.He is in SR today but last week called to the office because he was in SR with HR in the low 40's. Carvedilol was decreased and his heart rate has been in the 50's since then. He has had intermittent days of afib. No dysphagia or rt groin issues. No fluid retention issues.   Today, he denies symptoms of palpitations, chest pain, shortness of breath, orthopnea, PND, lower extremity edema, dizziness, presyncope, syncope, or neurologic sequela. The patient is tolerating medications without difficulties and is otherwise without complaint today.   Past Medical History  Diagnosis Date  . Hypertension   . Glaucoma   . Hyperlipidemia   . OSA on CPAP 2012  . Chronic constipation   . Paroxysmal atrial fibrillation (HCC)     a. 04/2015 Tikosyn loading, DCCV x 1;  b. CHA2DS2VASc = 5-->Eliquis.  [name] CAD (coronary artery disease)     a. Cath 03/17/15 showing 100% ostial D1, 50% prox LAD to mid LAD, 40% RPDA stenosis. Med rx.  [name] NICM (nonischemic cardiomyopathy) ([facility])   . Kidney stones     "found during prostate OR; still in there as far as I know" (05/04/2015)  . Prostate cancer ([place])   . Melanoma of neck ([facility])     "right; came back IV; no chemo"  . Childhood asthma   . Chronic bronchitis ([place])     "had it several times til I quit smoking"  . Type II diabetes mellitus ([place])   . Arthritis     "hands probably" (05/04/2015)  . Chronic lower back pain   . History of gout    Past Surgical History  Procedure Laterality Date  . Shoulder open rotator cuff repair Right X 2  . Nm myocar perf wall motion  02/21/2012    EF 61% ,EXERCISE 7 METS.  exercise stopped due to wheezing and shortness of breathe  . Doppler echocardiography  05/25/2009    EF 50-55%,LA mildly dilated, LV function normal  . Carotid doppler  09/17/2008    rigt and left ICAs 0-49%;mildly  abnormal  . Cardiac catheterization N/A 03/17/2015    Procedure: Left Heart Cath and Coronary Angiography;  Surgeon: Lorretta Harp, MD;  Location: [facility];  Service: Cardiovascular;  Laterality: N/A;  . Prostatectomy    . Electrophysiologic study N/A 04/05/2015    Procedure: Cardioversion;  Surgeon: Sanda Klein, MD;  Location: [facility];  Service: Cardiovascular;  Laterality: N/A;  . Laparoscopic cholecystectomy    . Hernia repair    . Abdominal hernia repair      w/mesh  . Excisional hemorrhoidectomy      "inside and out"  . Fine needle aspiration Right     knee; "drew ~ 1 quart off"  . Melanoma excision Right     "neck"  . Tee without cardioversion N/A 09/05/2015    Procedure: TRANSESOPHAGEAL ECHOCARDIOGRAM (TEE);  Surgeon: Sueanne Margarita, MD;  Location: [facility] ENDOSCOPY;  Service: Cardiovascular;  Laterality: N/A;  . Electrophysiologic study N/A 09/06/2015    Procedure: Atrial Fibrillation Ablation;  Surgeon: Thompson Grayer, MD;  Location: [facility];  Service: Cardiovascular;  Laterality: N/A;    Current Outpatient Prescriptions  Medication Sig Dispense Refill  . apixaban (ELIQUIS) 5 MG TABS tablet Take 1 tablet (5 mg total) by mouth 2 (two) times daily. 180 tablet 3  . carvedilol (COREG) 12.5 MG tablet Take 0.5 tablets (6.25 mg total) by mouth 2 (two) times daily with a meal. 60 tablet 2  . docusate sodium (COLACE) 100 MG capsule Take 100 mg by mouth daily as needed (CONSTIPATION).     [name] dofetilide (TIKOSYN) 125 MCG capsule Take 3 capsules (375 mcg total) by mouth 2 (two) times daily. 180 capsule 6  . furosemide (LASIX) 20 MG tablet Take 1 tablet (20 mg total) by mouth 2 (two) times daily. 60 tablet 6  . gemfibrozil (LOPID) 600 MG tablet Take  600 mg by mouth at bedtime.     [name] glimepiride (AMARYL) 2 MG tablet Take 2 mg by mouth 2 (two) times daily.     [name] latanoprost (XALATAN) 0.005 % ophthalmic solution Place 1 drop into both eyes at bedtime.    [name] lisinopril (PRINIVIL,ZESTRIL) 20 MG tablet Take 20 mg by mouth at bedtime.    . magnesium oxide (MAG-OX) 400 MG tablet Take 1 tablet (400 mg total) by mouth 2 (two) times daily. 60 tablet 6  . metFORMIN (GLUCOPHAGE) 500 MG tablet Take 1,000 mg by mouth 2 (two) times daily.    . OMEGA-3 FATTY ACIDS PO Take 1,200 mg by mouth daily.    . pantoprazole (PROTONIX) 40 MG tablet Take 1 tablet (40 mg total) by mouth daily. 45 tablet 0  . sodium chloride (OCEAN) 0.65 % SOLN nasal spray Place 1 spray into both nostrils at bedtime as needed for congestion.      No current facility-administered medications for this encounter.    Allergies  Allergen Reactions  . Tape Rash and Other (See Comments)    Causes skin redness, Use paper tape only.    Social History   Social History  . Marital Status: Married    Spouse Name: N/A  . Number of Children: N/A  . Years of Education: N/A   Occupational History  . Retired    Social History Main Topics  . Smoking status: Former Smoker -- 2.00 packs/day for 27 years    Types: Cigarettes    Quit date: 10/31/1983  . Smokeless tobacco: Never Used  . Alcohol Use: No     Comment: "used to drink; stopped ~ 2008"  . Drug Use: No  . Sexual Activity: Not Currently   Other Topics Concern  . Not on file   Social History Narrative   Lives in [place], [place] with wife.    Family History  Problem Relation Age of Onset  . Heart disease Mother   . Lung cancer Mother   . Healthy Daughter   . Diabetes Father   . Heart disease Father   . Stroke Brother   . Heart attack Mother 19    ROS- All systems are reviewed and negative except as per the HPI above  Physical Exam: Filed Vitals:   10/11/15 1345 10/11/15 1542  BP: 166/86 148/84  Pulse: 51   Height: 5'  10" (1.778 m)   Weight: 243 lb (110.224 kg)     GEN- The patient is well appearing, alert and oriented x 3 today.   Head- normocephalic, atraumatic Eyes-  Sclera clear, conjunctiva pink Ears- hearing intact Oropharynx- clear Neck- supple, no JVP Lymph- no  cervical lymphadenopathy Lungs- Clear to ausculation bilaterally, normal work of breathing Heart- Regular rate and rhythm, no murmurs, rubs or gallops, PMI not laterally displaced GI- soft, NT, ND, + BS Extremities- no clubbing, cyanosis, or edema MS- no significant deformity or atrophy Skin- no rash or lesion Psych- euthymic mood, full affect Neuro- strength and sensation are intact  EKG- Sinus brady at 51 bpm, with first degree AV block, LAFB, pr int 216 ms, QRS int 102 ms, Qtc 451 ms Epic records reviewed  Assessment and Plan: 1. Afib S/p ablation  Continue tikosyn Continue apixaban Bmet/mag today  2. Bradycardia Carvedilol decreased to 6.25 mg bid, imporved  F/u with Er. Allred as scheduled 2/22  Geroge Baseman. [place], [facility] [address] [place], [place] [address] [phone]

## 2015-10-20 ENCOUNTER — Ambulatory Visit (HOSPITAL_COMMUNITY)
Admission: RE | Admit: 2015-10-20 | Discharge: 2015-10-20 | Disposition: A | Discharge status: Home / Self Care | Payer: Medicare Other | Source: Ambulatory Visit | Attending: Nurse Practitioner | Admitting: Nurse Practitioner

## 2015-10-20 DIAGNOSIS — I480 Paroxysmal atrial fibrillation: Secondary | ICD-10-CM | POA: Diagnosis not present

## 2015-10-20 DIAGNOSIS — E119 Type 2 diabetes mellitus without complications: Secondary | ICD-10-CM | POA: Diagnosis present

## 2015-10-20 LAB — MAGNESIUM: Magnesium: 1.7 mg/dL (ref 1.7–2.4)

## 2015-10-28 ENCOUNTER — Ambulatory Visit (HOSPITAL_COMMUNITY)
Admission: RE | Admit: 2015-10-28 | Discharge: 2015-10-28 | Disposition: A | Discharge status: Home / Self Care | Payer: Medicare Other | Source: Ambulatory Visit | Attending: Nurse Practitioner | Admitting: Nurse Practitioner

## 2015-10-28 DIAGNOSIS — I480 Paroxysmal atrial fibrillation: Secondary | ICD-10-CM

## 2015-10-28 LAB — MAGNESIUM: Magnesium: 1.5 mg/dL — ABNORMAL LOW (ref 1.7–2.4)

## 2015-11-08 ENCOUNTER — Ambulatory Visit (HOSPITAL_COMMUNITY)
Admission: RE | Admit: 2015-11-08 | Discharge: 2015-11-08 | Disposition: A | Discharge status: Home / Self Care | Payer: Medicare Other | Source: Ambulatory Visit | Attending: Nurse Practitioner | Admitting: Nurse Practitioner

## 2015-11-08 DIAGNOSIS — I480 Paroxysmal atrial fibrillation: Secondary | ICD-10-CM | POA: Diagnosis not present

## 2015-11-08 LAB — MAGNESIUM: MAGNESIUM: 1.5 mg/dL — AB (ref 1.7–2.4)

## 2015-11-16 ENCOUNTER — Ambulatory Visit (INDEPENDENT_AMBULATORY_CARE_PROVIDER_SITE_OTHER): Payer: Medicare Other | Admitting: Internal Medicine

## 2015-11-16 ENCOUNTER — Encounter: Payer: Self-pay | Admitting: Internal Medicine

## 2015-11-16 VITALS — BP 128/82 | HR 64 | Ht 71.0 in | Wt 236.0 lb

## 2015-11-16 DIAGNOSIS — I420 Dilated cardiomyopathy: Secondary | ICD-10-CM

## 2015-11-16 DIAGNOSIS — I519 Heart disease, unspecified: Secondary | ICD-10-CM | POA: Diagnosis not present

## 2015-11-16 DIAGNOSIS — I481 Persistent atrial fibrillation: Secondary | ICD-10-CM

## 2015-11-16 DIAGNOSIS — I4819 Other persistent atrial fibrillation: Secondary | ICD-10-CM

## 2015-11-16 DIAGNOSIS — I429 Cardiomyopathy, unspecified: Secondary | ICD-10-CM | POA: Diagnosis not present

## 2015-11-16 NOTE — Progress Notes (Signed)
Electrophysiology Office Note   Date:  11/16/2015   ID:  Lucas Dudley, DOB 1940-10-29, MRN [id number]  PCP:  Robert Bellow, MD  Cardiologist:  Dr Gwenlyn Found Primary Electrophysiologist: Thompson Grayer, MD    Chief Complaint  Patient presents with  . Atrial Fibrillation     History of Present Illness: Lucas Dudley is a 75 y.o. male who presents today for electrophysiology evaluation.   He has done well since his recent ablation.  Unfortunately, he has returned to afib.  Not sure if it is intermittent or more persistent.  He was in sinus in the AF clinic in December.  Denies procedure related complications. Feels "pretty good" in afib.  Today, he denies symptoms of palpitations, chest pain,  orthopnea, PND, lower extremity edema, claudication, dizziness, presyncope, syncope, bleeding, or neurologic sequela. The patient is tolerating medications without difficulties and is otherwise without complaint today.    Past Medical History  Diagnosis Date  . Hypertension   . Glaucoma   . Hyperlipidemia   . OSA on CPAP 2012  . Chronic constipation   . Paroxysmal atrial fibrillation (HCC)     a. 04/2015 Tikosyn loading, DCCV x 1;  b. CHA2DS2VASc = 5-->Eliquis.  [name] CAD (coronary artery disease)     a. Cath 03/17/15 showing 100% ostial D1, 50% prox LAD to mid LAD, 40% RPDA stenosis. Med rx.  [name] NICM (nonischemic cardiomyopathy) ([place])   . Kidney stones     "found during prostate OR; still in there as far as I know" (05/04/2015)  . Prostate cancer ([place])   . Melanoma of neck ([place])     "right; came back IV; no chemo"  . Childhood asthma   . Chronic bronchitis ([place])     "had it several times til I quit smoking"  . Type II diabetes mellitus ([place])   . Arthritis     "hands probably" (05/04/2015)  . Chronic lower back pain   . History of gout    Past Surgical History  Procedure Laterality Date  . Shoulder open rotator cuff repair Right X 2  . Nm myocar perf wall motion  02/21/2012    EF 61%  ,EXERCISE 7 METS. exercise stopped due to wheezing and shortness of breathe  . Doppler echocardiography  05/25/2009    EF 50-55%,LA mildly dilated, LV function normal  . Carotid doppler  09/17/2008    rigt and left ICAs 0-49%;mildly  abnormal  . Cardiac catheterization N/A 03/17/2015    Procedure: Left Heart Cath and Coronary Angiography;  Surgeon: Lorretta Harp, MD;  Location: [facility];  Service: Cardiovascular;  Laterality: N/A;  . Prostatectomy    . Electrophysiologic study N/A 04/05/2015    Procedure: Cardioversion;  Surgeon: Sanda Klein, MD;  Location: [facility];  Service: Cardiovascular;  Laterality: N/A;  . Laparoscopic cholecystectomy    . Hernia repair    . Abdominal hernia repair      w/mesh  . Excisional hemorrhoidectomy      "inside and out"  . Fine needle aspiration Right     knee; "drew ~ 1 quart off"  . Melanoma excision Right     "neck"  . Tee without cardioversion N/A 09/05/2015    Procedure: TRANSESOPHAGEAL ECHOCARDIOGRAM (TEE);  Surgeon: Sueanne Margarita, MD;  Location: [facility] ENDOSCOPY;  Service: Cardiovascular;  Laterality: N/A;  . Electrophysiologic study N/A 09/06/2015    Procedure: Atrial Fibrillation Ablation;  Surgeon: Thompson Grayer, MD;  Location: [facility];  Service: Cardiovascular;  Laterality: N/A;     Current Outpatient Prescriptions  Medication Sig Dispense Refill  . apixaban (ELIQUIS) 5 MG TABS tablet Take 1 tablet (5 mg total) by mouth 2 (two) times daily. 180 tablet 3  . carvedilol (COREG) 12.5 MG tablet Take 0.5 tablets (6.25 mg total) by mouth 2 (two) times daily with a meal. 60 tablet 2  . docusate sodium (COLACE) 100 MG capsule Take 100 mg by mouth daily as needed (CONSTIPATION).     [name] dofetilide (TIKOSYN) 125 MCG capsule Take 3 capsules (375 mcg total) by mouth 2 (two) times daily. 180 capsule 6  . furosemide (LASIX) 20 MG tablet Take 20 mg by mouth daily.    [name] gemfibrozil (LOPID) 600 MG tablet Take 600 mg by mouth at  bedtime.     [name] glimepiride (AMARYL) 2 MG tablet Take 2 mg by mouth 2 (two) times daily.     [name] latanoprost (XALATAN) 0.005 % ophthalmic solution Place 1 drop into both eyes at bedtime.    [name] lisinopril (PRINIVIL,ZESTRIL) 20 MG tablet Take 20 mg by mouth at bedtime.    . magnesium oxide (MAG-OX) 400 MG tablet Take 1 tablet (400 mg total) by mouth 2 (two) times daily. 60 tablet 6  . metFORMIN (GLUCOPHAGE) 500 MG tablet Take 1,000 mg by mouth 2 (two) times daily.    . OMEGA-3 FATTY ACIDS PO Take 1,200 mg by mouth daily.    . pantoprazole (PROTONIX) 40 MG tablet Take 1 tablet (40 mg total) by mouth daily. 45 tablet 0   No current facility-administered medications for this visit.    Allergies:   Tape   Social History:  The patient  reports that he quit smoking about 32 years ago. His smoking use included Cigarettes. He has a 54 pack-year smoking history. He has never used smokeless tobacco. He reports that he does not drink alcohol or use illicit drugs.   Family History:  The patient's  family history includes Diabetes in his father; Healthy in his daughter; Heart attack (age of onset: 31) in his mother; Heart disease in his father and mother; Lung cancer in his mother; Stroke in his brother.    ROS:  Please see the history of present illness.   All other systems are reviewed and negative.    PHYSICAL EXAM: VS:  BP 128/82 mmHg  Pulse 64  Ht 5\' 11"  (1.803 m)  Wt 236 lb (107.049 kg)  BMI 32.93 kg/m2 , BMI Body mass index is 32.93 kg/(m^2). GEN: Well nourished, well developed, in no acute distress HEENT: normal Neck: no JVD, carotid bruits, or masses Cardiac: iRRR; no murmurs, rubs, or gallops,no edema  Respiratory:  clear to auscultation bilaterally, normal work of breathing GI: soft, nontender, nondistended, + BS MS: no deformity or atrophy Skin: warm and dry  Neuro:  Strength and sensation are intact Psych: euthymic mood, full affect  EKG:  EKG today reveals afib, V rate 104  bpm  Recent Labs: 03/11/2015: TSH 2.663 08/30/2015: Hemoglobin 15.0; Platelets 233 10/11/2015: BUN 16; Creatinine, Ser 1.12; Potassium 4.1; Sodium 140 11/08/2015: Magnesium 1.5*    Lipid Panel  No results found for: CHOL, TRIG, HDL, CHOLHDL, VLDL, LDLCALC, LDLDIRECT   Wt Readings from Last 3 Encounters:  11/16/15 236 lb (107.049 kg)  10/11/15 243 lb (110.224 kg)  09/05/15 238 lb (107.956 kg)      Other studies Reviewed: Additional studies/ records that were reviewed today include: AF clinic notes, prior TEE (EF 55%)  ASSESSMENT AND PLAN:  1.  Persistent atrial fibrillation Doing well s/p ablation Has had ERAF Not sure if this is persistent or intermittent Will have him return to the AF clinic next week for an ekg.  If still in AF, would consider amiodarone and cardioversion. I will see again in 6 weeks to determine whether he would benefit from additional ablation vs ongoing medical management  2. Nonischemic CM/ chronic systolic dysfunction Would favor ongoing medical therapy and then to reassess EF once in sinus.  Would like to avoid ICD implantation if possible Continue current medical therapy  3. HTN Stable No change required today  4. CAD No ischemic symptoms  5. OSA Followed by Dr Claiborne Billings  Current medicines are reviewed at length with the patient today.   The patient does not have concerns regarding his medicines.  The following changes were made today:  none  Signed, Thompson Grayer, MD  11/16/2015 9:36 AM     [facility] [address] [place] [address] [phone] (office) [phone] (fax)

## 2015-11-16 NOTE — Patient Instructions (Signed)
Medication Instructions:  Your physician recommends that you continue on your current medications as directed. Please refer to the Current Medication list given to you today.  Labwork: None ordered  Testing/Procedures: None  ordered  Follow-Up: Your physician recommends that you schedule a follow-up appointment with Roderic Palau, NP in the AFib Clinic on Tuesday 2/7 @ 2:00 pm. You will need an EKG at this visit.  Your physician recommends that you schedule a follow-up appointment in: 6 weeks with Dr. Rayann Heman.  If you need a refill on your cardiac medications before your next appointment, please call your pharmacy.  Thank you for choosing [facility]!!

## 2015-11-22 ENCOUNTER — Ambulatory Visit (HOSPITAL_COMMUNITY)
Admission: RE | Admit: 2015-11-22 | Discharge: 2015-11-22 | Disposition: A | Discharge status: Home / Self Care | Payer: Medicare Other | Source: Ambulatory Visit | Attending: Nurse Practitioner | Admitting: Nurse Practitioner

## 2015-11-22 VITALS — BP 120/82 | HR 111 | Ht 70.5 in | Wt 236.0 lb

## 2015-11-22 DIAGNOSIS — Z85820 Personal history of malignant melanoma of skin: Secondary | ICD-10-CM | POA: Insufficient documentation

## 2015-11-22 DIAGNOSIS — I2510 Atherosclerotic heart disease of native coronary artery without angina pectoris: Secondary | ICD-10-CM | POA: Insufficient documentation

## 2015-11-22 DIAGNOSIS — Z833 Family history of diabetes mellitus: Secondary | ICD-10-CM | POA: Insufficient documentation

## 2015-11-22 DIAGNOSIS — I428 Other cardiomyopathies: Secondary | ICD-10-CM | POA: Insufficient documentation

## 2015-11-22 DIAGNOSIS — Z79899 Other long term (current) drug therapy: Secondary | ICD-10-CM | POA: Insufficient documentation

## 2015-11-22 DIAGNOSIS — Z8546 Personal history of malignant neoplasm of prostate: Secondary | ICD-10-CM | POA: Diagnosis not present

## 2015-11-22 DIAGNOSIS — I10 Essential (primary) hypertension: Secondary | ICD-10-CM | POA: Insufficient documentation

## 2015-11-22 DIAGNOSIS — Z823 Family history of stroke: Secondary | ICD-10-CM | POA: Insufficient documentation

## 2015-11-22 DIAGNOSIS — Z8249 Family history of ischemic heart disease and other diseases of the circulatory system: Secondary | ICD-10-CM | POA: Insufficient documentation

## 2015-11-22 DIAGNOSIS — E785 Hyperlipidemia, unspecified: Secondary | ICD-10-CM | POA: Insufficient documentation

## 2015-11-22 DIAGNOSIS — Z7902 Long term (current) use of antithrombotics/antiplatelets: Secondary | ICD-10-CM | POA: Insufficient documentation

## 2015-11-22 DIAGNOSIS — G4733 Obstructive sleep apnea (adult) (pediatric): Secondary | ICD-10-CM | POA: Diagnosis not present

## 2015-11-22 DIAGNOSIS — Z87891 Personal history of nicotine dependence: Secondary | ICD-10-CM | POA: Diagnosis not present

## 2015-11-22 DIAGNOSIS — I481 Persistent atrial fibrillation: Secondary | ICD-10-CM

## 2015-11-22 DIAGNOSIS — Z7984 Long term (current) use of oral hypoglycemic drugs: Secondary | ICD-10-CM | POA: Insufficient documentation

## 2015-11-22 DIAGNOSIS — H409 Unspecified glaucoma: Secondary | ICD-10-CM | POA: Insufficient documentation

## 2015-11-22 DIAGNOSIS — E119 Type 2 diabetes mellitus without complications: Secondary | ICD-10-CM | POA: Diagnosis not present

## 2015-11-22 DIAGNOSIS — I4819 Other persistent atrial fibrillation: Secondary | ICD-10-CM

## 2015-11-22 LAB — BASIC METABOLIC PANEL
ANION GAP: 10 (ref 5–15)
BUN: 16 mg/dL (ref 6–20)
CHLORIDE: 98 mmol/L — AB (ref 101–111)
CO2: 29 mmol/L (ref 22–32)
Calcium: 9.2 mg/dL (ref 8.9–10.3)
Creatinine, Ser: 1.11 mg/dL (ref 0.61–1.24)
GFR calc non Af Amer: >60 mL/min (ref >60)
Glucose, Bld: 254 mg/dL — ABNORMAL HIGH (ref 65–99)
POTASSIUM: 4.3 mmol/L (ref 3.5–5.1)
Sodium: 137 mmol/L (ref 135–145)

## 2015-11-22 LAB — MAGNESIUM: MAGNESIUM: 1.7 mg/dL (ref 1.7–2.4)

## 2015-11-23 ENCOUNTER — Encounter (HOSPITAL_COMMUNITY): Payer: Self-pay | Admitting: Nurse Practitioner

## 2015-11-23 NOTE — Progress Notes (Signed)
Patient ID: Lucas Dudley, male   DOB: [birth year], 75 y.o.   MRN: [id number]      Primary Care Physician: Robert Bellow, MD Referring Physician: Dr. Genevie Ann is a 75 y.o. male with a h/o persistent afib, CAD, OSA, HTN, s/p ablation tikosyn and abaltion 11/16, He was seen by Dr. Rayann Heman few weeks ago, was in afib, fearing he is in afib more than SR, and was asked to f/u here. He continues to be in afib. Discussed options, cardioversion on tikosyn, stopping tikosyn and starting amiodarone with f/u cardioversion, if drug did not chemically convert to SR, or repeat ablation.   The pt states that he cannot tell when he is in afib and when he is in [place]. He feels about the same when he is in afib. He is opposed to anymore cardioversions or ablations. He would prefer to come off tiksoyn and be rate controlled. He did not like the possible side effects associated with amiodarone. His EF  echo 4/16 did show reduced EF at 40%, but EF with TEE at time of ablation was 55%.  Today, he denies symptoms of palpitations, chest pain, shortness of breath, orthopnea, PND, lower extremity edema, dizziness, presyncope, syncope, or neurologic sequela. The patient is tolerating medications without difficulties and is otherwise without complaint today.   Past Medical History  Diagnosis Date  . Hypertension   . Glaucoma   . Hyperlipidemia   . OSA on CPAP 2012  . Chronic constipation   . Paroxysmal atrial fibrillation (HCC)     a. 04/2015 Tikosyn loading, DCCV x 1;  b. CHA2DS2VASc = 5-->Eliquis.  [name] CAD (coronary artery disease)     a. Cath 03/17/15 showing 100% ostial D1, 50% prox LAD to mid LAD, 40% RPDA stenosis. Med rx.  [name] NICM (nonischemic cardiomyopathy) ([place])   . Kidney stones     "found during prostate OR; still in there as far as I know" (05/04/2015)  . Prostate cancer ([place])   . Melanoma of neck ([place])     "right; came back IV; no chemo"  . Childhood asthma   . Chronic bronchitis ([place])    "had it several times til I quit smoking"  . Type II diabetes mellitus ([place])   . Arthritis     "hands probably" (05/04/2015)  . Chronic lower back pain   . History of gout    Past Surgical History  Procedure Laterality Date  . Shoulder open rotator cuff repair Right X 2  . Nm myocar perf wall motion  02/21/2012    EF 61% ,EXERCISE 7 METS. exercise stopped due to wheezing and shortness of breathe  . Doppler echocardiography  05/25/2009    EF 50-55%,LA mildly dilated, LV function normal  . Carotid doppler  09/17/2008    rigt and left ICAs 0-49%;mildly  abnormal  . Cardiac catheterization N/A 03/17/2015    Procedure: Left Heart Cath and Coronary Angiography;  Surgeon: Lorretta Harp, MD;  Location: [facility];  Service: Cardiovascular;  Laterality: N/A;  . Prostatectomy    . Electrophysiologic study N/A 04/05/2015    Procedure: Cardioversion;  Surgeon: Sanda Klein, MD;  Location: [facility];  Service: Cardiovascular;  Laterality: N/A;  . Laparoscopic cholecystectomy    . Hernia repair    . Abdominal hernia repair      w/mesh  . Excisional hemorrhoidectomy      "inside and out"  . Fine needle aspiration Right  knee; "drew ~ 1 quart off"  . Melanoma excision Right     "neck"  . Tee without cardioversion N/A 09/05/2015    Procedure: TRANSESOPHAGEAL ECHOCARDIOGRAM (TEE);  Surgeon: Sueanne Margarita, MD;  Location: [facility] END[facility];  Service: Cardiovascular;  Laterality: N/A;  . Electrophysiologic study N/A 09/06/2015    Procedure: Atrial Fibrillation Ablation;  Surgeon: Thompson Grayer, MD;  Location: [place] [facility];  Service: Cardiovascular;  Laterality: N/A;    Current Outpatient Prescriptions  Medication Sig Dispense Refill  . apixaban (ELIQUIS) 5 MG TABS tablet Take 1 tablet (5 mg total) by mouth 2 (two) times daily. 180 tablet 3  . carvedilol (COREG) 12.5 MG tablet Take 0.5 tablets (6.25 mg total) by mouth 2 (two) times daily with a meal. 60 tablet 2  . docusate  sodium (COLACE) 100 MG capsule Take 100 mg by mouth daily as needed (CONSTIPATION).     [name] dofetilide (TIKOSYN) 125 MCG capsule Take 3 capsules (375 mcg total) by mouth 2 (two) times daily. 180 capsule 6  . furosemide (LASIX) 20 MG tablet Take 20 mg by mouth daily.    [name] gemfibrozil (LOPID) 600 MG tablet Take 600 mg by mouth at bedtime.     [name] glimepiride (AMARYL) 2 MG tablet Take 2 mg by mouth 2 (two) times daily.     [name] latanoprost (XALATAN) 0.005 % ophthalmic solution Place 1 drop into both eyes at bedtime.    [name] lisinopril (PRINIVIL,ZESTRIL) 20 MG tablet Take 20 mg by mouth at bedtime.    . magnesium oxide (MAG-OX) 400 MG tablet Take 1 tablet (400 mg total) by mouth 2 (two) times daily. 60 tablet 6  . metFORMIN (GLUCOPHAGE) 500 MG tablet Take 1,000 mg by mouth 2 (two) times daily.    . OMEGA-3 FATTY ACIDS PO Take 1,200 mg by mouth daily.    . pantoprazole (PROTONIX) 40 MG tablet Take 1 tablet (40 mg total) by mouth daily. 45 tablet 0   No current facility-administered medications for this encounter.    Allergies  Allergen Reactions  . Tape Rash and Other (See Comments)    Causes skin redness, Use paper tape only.    Social History   Social History  . Marital Status: Married    Spouse Name: N/A  . Number of Children: N/A  . Years of Education: N/A   Occupational History  . Retired    Social History Main Topics  . Smoking status: Former Smoker -- 2.00 packs/day for 27 years    Types: Cigarettes    Quit date: 10/31/1983  . Smokeless tobacco: Never Used  . Alcohol Use: No     Comment: "used to drink; stopped ~ 2008"  . Drug Use: No  . Sexual Activity: Not Currently   Other Topics Concern  . Not on file   Social History Narrative   Lives in [place], [place] with wife.    Family History  Problem Relation Age of Onset  . Heart disease Mother   . Lung cancer Mother   . Healthy Daughter   . Diabetes Father   . Heart disease Father   . Stroke Brother   . Heart attack Mother 61     ROS- All systems are reviewed and negative except as per the HPI above  Physical Exam: Filed Vitals:   11/22/15 1408  BP: 120/82  Pulse: 111  Height: 5' 10.5" (1.791 m)  Weight: 236 lb (107.049 kg)    GEN- The patient is well appearing, alert  and oriented x 3 today.   Head- normocephalic, atraumatic Eyes-  Sclera clear, conjunctiva pink Ears- hearing intact Oropharynx- clear Neck- supple, no JVP Lymph- no cervical lymphadenopathy Lungs- Clear to ausculation bilaterally, normal work of breathing Heart- Regular rate and rhythm, no murmurs, rubs or gallops, PMI not laterally displaced GI- soft, NT, ND, + BS Extremities- no clubbing, cyanosis, or edema MS- no significant deformity or atrophy Skin- no rash or lesion Psych- euthymic mood, full affect Neuro- strength and sensation are intact  EKG- Afib with v rate of 111 bpm, qrs int 94, qtc 495 ms, IRBBB Epic records reviewed  Assessment and Plan: 1. Discussed with Dr. Rayann Heman and let him know of pt's wishes of stopping tikosyn and being rate controlled. Dr. Rayann Heman is ok with this. Stop tikosyn Increase carvedilol to 18.75  mg bid Can stop magnesium supplement which pt believes to be causing diarrhea Continue apixaban  F/u with Dr. Rayann Heman 3/16 as scheduled afib clinic as needed  Butch Penny C. Naryiah Schley, [facility] [address] [place], [place] [address] [phone]

## 2015-11-24 ENCOUNTER — Telehealth (HOSPITAL_COMMUNITY): Payer: Self-pay | Admitting: *Deleted

## 2015-11-24 ENCOUNTER — Other Ambulatory Visit (HOSPITAL_COMMUNITY): Payer: Self-pay | Admitting: *Deleted

## 2015-11-24 MED ORDER — CARVEDILOL 12.5 MG PO TABS: 18.7500 mg | ORAL_TABLET | Freq: Two times a day (BID) | ORAL | Status: DC

## 2015-11-24 NOTE — Telephone Encounter (Signed)
Pt notified to stop tikosyn (per Dr. Rayann Heman recommendations as pt would prefer this) can also stop magnesium supplement. Increase Coreg to 18.75mg  twice a day. Patient verbalized understanding -- will call back if having difficulty tolerating increased dose of coreg

## 2015-11-24 NOTE — Progress Notes (Signed)
Pt notified to stop tikosyn (per Dr. Rayann Heman recommendations as pt would prefer this) can also stop magnesium supplement.  Increase Coreg to 18.75mg  twice a day. Patient verbalized understanding -- will call back if having difficulty tolerating increased dose of coreg.

## 2015-11-28 ENCOUNTER — Other Ambulatory Visit (HOSPITAL_COMMUNITY): Payer: Self-pay | Admitting: *Deleted

## 2015-11-28 MED ORDER — CARVEDILOL 12.5 MG PO TABS: 18.7500 mg | ORAL_TABLET | Freq: Two times a day (BID) | ORAL | Status: DC

## 2015-12-04 ENCOUNTER — Other Ambulatory Visit: Payer: Self-pay | Admitting: Nurse Practitioner

## 2015-12-05 NOTE — Telephone Encounter (Signed)
REFILL 

## 2015-12-07 ENCOUNTER — Encounter: Payer: Medicare Other | Admitting: Internal Medicine

## 2015-12-21 ENCOUNTER — Telehealth (INDEPENDENT_AMBULATORY_CARE_PROVIDER_SITE_OTHER): Payer: Self-pay | Admitting: *Deleted

## 2015-12-21 NOTE — Telephone Encounter (Signed)
Per Dr.Rehman - patient may have an appointment with him this month. Patient was called and told what he may do prior to his appointment.  Reminder to [employer].

## 2015-12-22 NOTE — Telephone Encounter (Signed)
Lucas Dudley is scheduled with Dr. Laural Golden on March 14th, 2017 at 1:45pm. Thank you.

## 2015-12-22 NOTE — Telephone Encounter (Signed)
Noted. Patient was also called and made aware that he could take Imodium 1-2 a day as needed for diarrhea.

## 2015-12-27 ENCOUNTER — Ambulatory Visit (INDEPENDENT_AMBULATORY_CARE_PROVIDER_SITE_OTHER): Payer: Medicare Other | Admitting: Internal Medicine

## 2015-12-27 ENCOUNTER — Encounter (INDEPENDENT_AMBULATORY_CARE_PROVIDER_SITE_OTHER): Payer: Self-pay | Admitting: Internal Medicine

## 2015-12-27 VITALS — BP 114/70 | HR 60 | Temp 97.8°F | Resp 18 | Ht 71.0 in | Wt 239.3 lb

## 2015-12-27 DIAGNOSIS — K589 Irritable bowel syndrome without diarrhea: Secondary | ICD-10-CM

## 2015-12-27 MED ORDER — BENEFIBER DRINK MIX PO PACK: 4.0000 g | PACK | Freq: Every day | ORAL | Status: DC

## 2015-12-27 MED ORDER — DOCUSATE SODIUM 100 MG PO CAPS: 100.0000 mg | ORAL_CAPSULE | Freq: Every day | ORAL | Status: DC

## 2015-12-27 NOTE — Patient Instructions (Signed)
Can increase Colace to 200 mg at bedtime after 2 weeks if no benefit noted with 100 mg daily. Call office with progress report in 2 months.

## 2015-12-27 NOTE — Progress Notes (Signed)
Presenting complaint;  Bowel problems.  Subjective:  Patient is 75 year old Caucasian male was here for scheduled visit. He was last seen in January 2015. He presents with irregular bowel movements. He states first bowel movement is hard and he may have to strain somewhat. First bowel movement generally occurs before breakfast. Second bowel movement may occur before or after breakfast and occasionally he has a third bowel movement later in the day. Most of his stools are thin caliber but every now and then he has normal stool. He denies abdominal pain melena or rectal bleeding or sense of incomplete evacuation. He has good appetite. He has lost 4 pounds since his visit over 2 years ago. He states he is been on metformin for more than 20 years. He believes he eats enough fiber in his diet. Last colonoscopy was in November 2010 revealing sigmoid colon diverticulosis external hemorrhoids and single small tubular adenoma. He is advised to have colonoscopy and 72 is which would be this fall.  Current Medications: Outpatient Encounter Prescriptions as of 12/27/2015  Medication Sig  . apixaban (ELIQUIS) 5 MG TABS tablet Take 1 tablet (5 mg total) by mouth 2 (two) times daily.  . calcium carbonate (TUMS) 500 MG chewable tablet Chew 1 tablet by mouth daily. Patient states that he uses as needed.  . carvedilol (COREG) 12.5 MG tablet Take 1.5 tablets (18.75 mg total) by mouth 2 (two) times daily with a meal.  . furosemide (LASIX) 20 MG tablet Take 20 mg by mouth daily.  [name] gemfibrozil (LOPID) 600 MG tablet Take 600 mg by mouth at bedtime.   [name] glimepiride (AMARYL) 2 MG tablet Take 2 mg by mouth 2 (two) times daily.   [name] latanoprost (XALATAN) 0.005 % ophthalmic solution Place 1 drop into both eyes at bedtime.  [name] lisinopril (PRINIVIL,ZESTRIL) 20 MG tablet Take 20 mg by mouth at bedtime.  . metFORMIN (GLUCOPHAGE) 500 MG tablet Take 1,000 mg by mouth 2 (two) times daily.  . OMEGA-3 FATTY ACIDS PO Take 1,200 mg  by mouth daily.  . [DISCONTINUED] docusate sodium (COLACE) 100 MG capsule Take 100 mg by mouth daily as needed (CONSTIPATION). Reported on 12/27/2015  . [DISCONTINUED] dofetilide (TIKOSYN) 125 MCG capsule TAKE 3 CAPSULES (375 MCG TOTAL) BY MOUTH 2 (TWO) TIMES DAILY. (Patient not taking: Reported on 12/27/2015)  . [DISCONTINUED] pantoprazole (PROTONIX) 40 MG tablet Take 1 tablet (40 mg total) by mouth daily. (Patient not taking: Reported on 12/27/2015)   No facility-administered encounter medications on file as of 12/27/2015.   Past Medical History  Diagnosis Date  . Hypertension   . Glaucoma   . Hyperlipidemia   . OSA on CPAP 2012  . Chronic constipation   . Paroxysmal atrial fibrillation (HCC)     a. 04/2015 Tikosyn loading, DCCV x 1;  b. CHA2DS2VASc = 5-->Eliquis.  [name] CAD (coronary artery disease)     a. Cath 03/17/15 showing 100% ostial D1, 50% prox LAD to mid LAD, 40% RPDA stenosis. Med rx.  [name] NICM (nonischemic cardiomyopathy) ([place])   . Kidney stones     "found during prostate OR; still in there as far as I know" (05/04/2015)  . Prostate cancer ([place])   . Melanoma of neck ([place])     "right; came back IV; no chemo"  . Childhood asthma   . Chronic bronchitis ([place])     "had it several times til I quit smoking"  . Type II diabetes mellitus ([place])   . Arthritis     "hands  probably" (05/04/2015)  . Chronic lower back pain   . History of gout    Past Surgical History  Procedure Laterality Date  . Shoulder open rotator cuff repair Right X 2  . Nm myocar perf wall motion  02/21/2012    EF 61% ,EXERCISE 7 METS. exercise stopped due to wheezing and shortness of breathe  . Doppler echocardiography  05/25/2009    EF 50-55%,LA mildly dilated, LV function normal  . Carotid doppler  09/17/2008    rigt and left ICAs 0-49%;mildly  abnormal  . Cardiac catheterization N/A 03/17/2015    Procedure: Left Heart Cath and Coronary Angiography;  Surgeon: Lorretta Harp, MD;  Location: [facility];   Service: Cardiovascular;  Laterality: N/A;  . Prostatectomy    . Electrophysiologic study N/A 04/05/2015    Procedure: Cardioversion;  Surgeon: Sanda Klein, MD;  Location: [facility];  Service: Cardiovascular;  Laterality: N/A;  . Laparoscopic cholecystectomy    . Hernia repair    . Abdominal hernia repair      w/mesh  . Excisional hemorrhoidectomy      "inside and out"  . Fine needle aspiration Right     knee; "drew ~ 1 quart off"  . Melanoma excision Right     "neck"  . Tee without cardioversion N/A 09/05/2015    Procedure: TRANSESOPHAGEAL ECHOCARDIOGRAM (TEE);  Surgeon: Sueanne Margarita, MD;  Location: [facility] ENDOSCOPY;  Service: Cardiovascular;  Laterality: N/A;  . Electrophysiologic study N/A 09/06/2015    Procedure: Atrial Fibrillation Ablation;  Surgeon: Thompson Grayer, MD;  Location: [facility];  Service: Cardiovascular;  Laterality: N/A;     Objective: Blood pressure 114/70, pulse 60, temperature 97.8 F (36.6 C), temperature source Oral, resp. rate 18, height 5\' 11"  (1.803 m), weight 239 lb 4.8 oz (108.546 kg). Patient is alert and in no acute distress. Conjunctiva is pink. Sclera is nonicteric Oropharyngeal mucosa is normal. No neck masses or thyromegaly noted. Cardiac exam with regular rhythm normal S1 and S2. No murmur or gallop noted. Lungs are clear to auscultation. Abdomen is protuberant. He has small umbilicus hernia. Bowel sounds are normal. On palpation abdomen is soft and nontender without organomegaly or masses. Rectal examination reveals normal sphincter tone. He has soft stool in the rectum and it is guaiac negative. He has trace to 1+ pitting edema around left ankle.   Assessment:  #1. 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 He had similar symptomatology when he underwent colonoscopy back in November 2010. If he does not respond to therapy may consider colonoscopy ahead of schedule.   Plan:  Colace 100-200 mg by mouth daily at  bedtime. Benefiber 4 g by mouth daily at bedtime. Patient will call with progress report in 2 months. If he does not feel better will proceed with colonoscopy otherwise she could wait until fall of this year.

## 2015-12-28 ENCOUNTER — Telehealth (INDEPENDENT_AMBULATORY_CARE_PROVIDER_SITE_OTHER): Payer: Self-pay | Admitting: *Deleted

## 2015-12-28 NOTE — Telephone Encounter (Signed)
   Diagnosis:    Result(s)   Card 1:  Negative:            Completed by:    HEMOCCULT SENSA DEVELOPER: LOT#: [id number]  EXPIRATION DATE: 9-17   HEMOCCULT SENSA CARD:  LOT#:  [id number] EXPIRATION DATE: 07-18   CARD CONTROL RESULTS:  POSITIVE: Positive NEGATIVE: Negative    ADDITIONAL COMMENTS: Patient is aware of the results, as test was preformed at time of office visit 12/27/2015.

## 2015-12-29 ENCOUNTER — Encounter: Payer: Self-pay | Admitting: Internal Medicine

## 2015-12-29 ENCOUNTER — Ambulatory Visit (INDEPENDENT_AMBULATORY_CARE_PROVIDER_SITE_OTHER): Payer: Medicare Other | Admitting: Internal Medicine

## 2015-12-29 VITALS — BP 144/72 | HR 46 | Ht 71.0 in | Wt 243.2 lb

## 2015-12-29 DIAGNOSIS — I10 Essential (primary) hypertension: Secondary | ICD-10-CM | POA: Diagnosis not present

## 2015-12-29 DIAGNOSIS — I481 Persistent atrial fibrillation: Secondary | ICD-10-CM | POA: Diagnosis not present

## 2015-12-29 DIAGNOSIS — I519 Heart disease, unspecified: Secondary | ICD-10-CM | POA: Diagnosis not present

## 2015-12-29 DIAGNOSIS — I4819 Other persistent atrial fibrillation: Secondary | ICD-10-CM

## 2015-12-29 DIAGNOSIS — R0602 Shortness of breath: Secondary | ICD-10-CM | POA: Diagnosis not present

## 2015-12-29 MED ORDER — CARVEDILOL 12.5 MG PO TABS: 12.5000 mg | ORAL_TABLET | Freq: Two times a day (BID) | ORAL | Status: DC

## 2015-12-29 NOTE — Patient Instructions (Signed)
Medication Instructions:  Your physician has recommended you make the following change in your medication:  1) Decrease Carvedilol to 12.5 mg twice daily   Labwork: None ordered   Testing/Procedures: Your physician has requested that you have an echocardiogram. Echocardiography is a painless test that uses sound waves to create images of your heart. It provides your doctor with information about the size and shape of your heart and how well your heart's chambers and valves are working. This procedure takes approximately one hour. There are no restrictions for this procedure.    Follow-Up: Your physician recommends that you schedule a follow-up appointment in: 3 months with Dr Rayann Heman   Any Other Special Instructions Will Be Listed Below (If Applicable).     If you need a refill on your cardiac medications before your next appointment, please call your pharmacy.

## 2015-12-29 NOTE — Progress Notes (Signed)
Electrophysiology Office Note   Date:  12/29/2015   ID:  Lucas Dudley, DOB [birth year], MRN [id number]  PCP:  Robert Bellow, MD  Cardiologist:  Dr Gwenlyn Found Primary Electrophysiologist: Thompson Grayer, MD    Chief Complaint  Patient presents with  . Atrial Fibrillation     History of Present Illness: Lucas Dudley is a 75 y.o. male who presents today for electrophysiology evaluation.  Back in sinus.  Unaware of when he is in afib.  Continues to have some SOB. Today, he denies symptoms of palpitations, chest pain,  orthopnea, PND, lower extremity edema, claudication, dizziness, presyncope, syncope, bleeding, or neurologic sequela. The patient is tolerating medications without difficulties and is otherwise without complaint today.    Past Medical History  Diagnosis Date  . Hypertension   . Glaucoma   . Hyperlipidemia   . OSA on CPAP 2012  . Chronic constipation   . Paroxysmal atrial fibrillation (HCC)     a. 04/2015 Tikosyn loading, DCCV x 1;  b. CHA2DS2VASc = 5-->Eliquis.  [name] CAD (coronary artery disease)     a. Cath 03/17/15 showing 100% ostial D1, 50% prox LAD to mid LAD, 40% RPDA stenosis. Med rx.  [name] NICM (nonischemic cardiomyopathy) ([place])   . Kidney stones     "found during prostate OR; still in there as far as I know" (05/04/2015)  . Prostate cancer ([place])   . Melanoma of neck ([place])     "right; came back IV; no chemo"  . Childhood asthma   . Chronic bronchitis ([place])     "had it several times til I quit smoking"  . Type II diabetes mellitus ([place])   . Arthritis     "hands probably" (05/04/2015)  . Chronic lower back pain   . History of gout    Past Surgical History  Procedure Laterality Date  . Shoulder open rotator cuff repair Right X 2  . Nm myocar perf wall motion  02/21/2012    EF 61% ,EXERCISE 7 METS. exercise stopped due to wheezing and shortness of breathe  . Doppler echocardiography  05/25/2009    EF 50-55%,LA mildly dilated, LV function normal  . Carotid  doppler  09/17/2008    rigt and left ICAs 0-49%;mildly  abnormal  . Cardiac catheterization N/A 03/17/2015    Procedure: Left Heart Cath and Coronary Angiography;  Surgeon: Lorretta Harp, MD;  Location: [facility];  Service: Cardiovascular;  Laterality: N/A;  . Prostatectomy    . Electrophysiologic study N/A 04/05/2015    Procedure: Cardioversion;  Surgeon: Sanda Klein, MD;  Location: [facility];  Service: Cardiovascular;  Laterality: N/A;  . Laparoscopic cholecystectomy    . Hernia repair    . Abdominal hernia repair      w/mesh  . Excisional hemorrhoidectomy      "inside and out"  . Fine needle aspiration Right     knee; "drew ~ 1 quart off"  . Melanoma excision Right     "neck"  . Tee without cardioversion N/A 09/05/2015    Procedure: TRANSESOPHAGEAL ECHOCARDIOGRAM (TEE);  Surgeon: Sueanne Margarita, MD;  Location: [facility] ENDOSCOPY;  Service: Cardiovascular;  Laterality: N/A;  . Electrophysiologic study N/A 09/06/2015    Procedure: Atrial Fibrillation Ablation;  Surgeon: Thompson Grayer, MD;  Location: [facility];  Service: Cardiovascular;  Laterality: N/A;     Current Outpatient Prescriptions  Medication Sig Dispense Refill  . apixaban (ELIQUIS) 5 MG TABS tablet Take 1 tablet (5 mg  total) by mouth 2 (two) times daily. 180 tablet 3  . calcium carbonate (TUMS) 500 MG chewable tablet Chew 1 tablet by mouth daily. Patient states that he uses as needed.    . carvedilol (COREG) 12.5 MG tablet Take 1.5 tablets (18.75 mg total) by mouth 2 (two) times daily with a meal. 270 tablet 3  . docusate sodium (COLACE) 100 MG capsule Take 1 capsule (100 mg total) by mouth at bedtime. 10 capsule 0  . furosemide (LASIX) 20 MG tablet Take 20 mg by mouth daily.    [name] gemfibrozil (LOPID) 600 MG tablet Take 600 mg by mouth at bedtime.     [name] glimepiride (AMARYL) 2 MG tablet Take 2 mg by mouth 2 (two) times daily.     [name] latanoprost (XALATAN) 0.005 % ophthalmic solution Place 1 drop into  both eyes at bedtime.    [name] lisinopril (PRINIVIL,ZESTRIL) 20 MG tablet Take 20 mg by mouth at bedtime.    . metFORMIN (GLUCOPHAGE) 500 MG tablet Take 1,000 mg by mouth 2 (two) times daily.    . OMEGA-3 FATTY ACIDS PO Take 1,200 mg by mouth daily.    . Wheat Dextrin (BENEFIBER) CHEW Chew 2 each by mouth at bedtime.     No current facility-administered medications for this visit.    Allergies:   Tape   Social History:  The patient  reports that he quit smoking about 32 years ago. His smoking use included Cigarettes. He has a 54 pack-year smoking history. He has never used smokeless tobacco. He reports that he does not drink alcohol or use illicit drugs.   Family History:  The patient's  family history includes Diabetes in his father; Healthy in his daughter; Heart attack (age of onset: 82) in his mother; Heart disease in his father and mother; Lung cancer in his mother; Stroke in his brother.    ROS:  Please see the history of present illness.   All other systems are reviewed and negative.    PHYSICAL EXAM: VS:  BP 144/72 mmHg  Pulse 46  Ht 5\' 11"  (1.803 m)  Wt 243 lb 3.2 oz (110.315 kg)  BMI 33.93 kg/m2 , BMI Body mass index is 33.93 kg/(m^2). GEN: Well nourished, well developed, in no acute distress HEENT: normal Neck: no JVD, carotid bruits, or masses Cardiac: RRR; no murmurs, rubs, or gallops,no edema  Respiratory:  clear to auscultation bilaterally, normal work of breathing GI: soft, nontender, nondistended, + BS MS: no deformity or atrophy Skin: warm and dry  Neuro:  Strength and sensation are intact Psych: euthymic mood, full affect  EKG:  EKG today reveals sinus rhythm 48 bpm, PR 240 msec, incomplete RBBB  Recent Labs: 03/11/2015: TSH 2.663 08/30/2015: Hemoglobin 15.0; Platelets 233 11/22/2015: BUN 16; Creatinine, Ser 1.11; Magnesium 1.7; Potassium 4.3; Sodium 137    Lipid Panel  No results found for: CHOL, TRIG, HDL, CHOLHDL, VLDL, LDLCALC, LDLDIRECT   Wt Readings  from Last 3 Encounters:  12/29/15 243 lb 3.2 oz (110.315 kg)  12/27/15 239 lb 4.8 oz (108.546 kg)  11/22/15 236 lb (107.049 kg)     ASSESSMENT AND PLAN:  1.  Persistent atrial fibrillation Now in sinus off of AAD therapy post ablation, though he was in AF last visit Will evaluate for GENETIC AF (he is very interested)  2. Nonischemic CM/ chronic systolic dysfunction Would favor ongoing medical therapy and then to reassess EF once in sinus.  Would like to avoid ICD implantation if possible Continue current  medical therapy GENETIC AF as above Reduce coreg today due to bradycardia Repeat echo at this time  3. HTN Stable No change required today  4. CAD No ischemic symptoms  5. OSA Followed by Dr Claiborne Billings  Current medicines are reviewed at length with the patient today.   The patient does not have concerns regarding his medicines.  The following changes were made today:  none  Signed, Thompson Grayer, MD  12/29/2015 2:06 PM     [place] [address] [phone] (office) [phone] (fax)

## 2016-01-04 ENCOUNTER — Ambulatory Visit (HOSPITAL_COMMUNITY): Payer: Medicare Other | Attending: Cardiology

## 2016-01-04 ENCOUNTER — Other Ambulatory Visit: Payer: Self-pay

## 2016-01-04 DIAGNOSIS — I340 Nonrheumatic mitral (valve) insufficiency: Secondary | ICD-10-CM | POA: Diagnosis not present

## 2016-01-04 DIAGNOSIS — I2510 Atherosclerotic heart disease of native coronary artery without angina pectoris: Secondary | ICD-10-CM | POA: Insufficient documentation

## 2016-01-04 DIAGNOSIS — I4891 Unspecified atrial fibrillation: Secondary | ICD-10-CM | POA: Insufficient documentation

## 2016-01-04 DIAGNOSIS — R0602 Shortness of breath: Secondary | ICD-10-CM | POA: Diagnosis not present

## 2016-01-04 DIAGNOSIS — I119 Hypertensive heart disease without heart failure: Secondary | ICD-10-CM | POA: Diagnosis not present

## 2016-01-04 DIAGNOSIS — R0600 Dyspnea, unspecified: Secondary | ICD-10-CM | POA: Diagnosis present

## 2016-01-05 NOTE — Addendum Note (Signed)
Addended by: Freada Bergeron on: 01/05/2016 05:42 PM   Modules accepted: Orders

## 2016-01-30 ENCOUNTER — Other Ambulatory Visit (HOSPITAL_COMMUNITY): Payer: Self-pay | Admitting: *Deleted

## 2016-01-30 ENCOUNTER — Telehealth: Payer: Self-pay | Admitting: *Deleted

## 2016-01-30 MED ORDER — MAGNESIUM OXIDE -MG SUPPLEMENT 400 (240 MG) MG PO TABS: 400.0000 mg | ORAL_TABLET | Freq: Every day | ORAL | Status: DC

## 2016-01-30 NOTE — Telephone Encounter (Signed)
Called Lucas Dudley with his [employer]. Magnesium level was 1.5 mg/dL reference range 1.6-2.6. Instructed [name] to start 400mg  Magnesium Oxide daily. He states he has it at home from his previous prescription. Genetic results for the trial are still pending.

## 2016-02-03 ENCOUNTER — Encounter: Payer: Self-pay | Admitting: *Deleted

## 2016-02-03 DIAGNOSIS — Z006 Encounter for examination for normal comparison and control in clinical research program: Secondary | ICD-10-CM

## 2016-02-03 NOTE — Progress Notes (Signed)
Called Lucas Dudley to inform him that he did not have the Arg/Arg gene for the Genetic AF research study.

## 2016-03-07 ENCOUNTER — Encounter: Payer: Self-pay | Admitting: *Deleted

## 2016-03-07 DIAGNOSIS — Z006 Encounter for examination for normal comparison and control in clinical research program: Secondary | ICD-10-CM

## 2016-03-07 NOTE — Progress Notes (Signed)
Late entry:    Genetic AF Informed Consent  Subject Name: Lucas Dudley  Subject met inclusion and exclusion criteria. The informed consent form, study requirements and expectations were reviewed with the subject and questions and concerns were addressed prior to the signing of the consent form. The subject verbalized understanding of the trail requirements. The subject agreed to participate in the Genetic AF trial and signed the informed consent. The informed consent was obtained prior to performance of any protocol-specific procedures for the subject. A copy of the signed informed consent was given to the subject and a copy was placed in the subject's medical record.   Jake Bathe, RN  01/27/2016 1400

## 2016-03-22 ENCOUNTER — Other Ambulatory Visit (HOSPITAL_COMMUNITY): Payer: Self-pay | Admitting: *Deleted

## 2016-03-22 MED ORDER — FUROSEMIDE 20 MG PO TABS: 20.0000 mg | ORAL_TABLET | Freq: Every day | ORAL | Status: DC

## 2016-04-02 ENCOUNTER — Ambulatory Visit (INDEPENDENT_AMBULATORY_CARE_PROVIDER_SITE_OTHER): Payer: Medicare Other | Admitting: Internal Medicine

## 2016-04-02 ENCOUNTER — Encounter: Payer: Self-pay | Admitting: Internal Medicine

## 2016-04-02 VITALS — BP 142/80 | HR 54 | Ht 69.0 in | Wt 241.4 lb

## 2016-04-02 DIAGNOSIS — I519 Heart disease, unspecified: Secondary | ICD-10-CM

## 2016-04-02 DIAGNOSIS — I4819 Other persistent atrial fibrillation: Secondary | ICD-10-CM

## 2016-04-02 DIAGNOSIS — I481 Persistent atrial fibrillation: Secondary | ICD-10-CM | POA: Diagnosis not present

## 2016-04-02 NOTE — Progress Notes (Signed)
Electrophysiology Office Note   Date:  04/02/2016   ID:  Lucas Dudley, DOB 19-Jul-1941, MRN [id number]  PCP:  Lucas Bellow, MD  Cardiologist:  Dr Gwenlyn Found Primary Electrophysiologist: Lucas Grayer, MD    Chief Complaint  Patient presents with  . Atrial Fibrillation     History of Present Illness: Lucas Dudley is a 75 y.o. male who presents today for electrophysiology evaluation.  Doing well presently.  Remains active.  Thinks he may have occasional afib but appears to be mostly in sinus rhythm.  Today, he denies symptoms of palpitations, chest pain,  orthopnea, PND, lower extremity edema, claudication, dizziness, presyncope, syncope, bleeding, or neurologic sequela. The patient is tolerating medications without difficulties and is otherwise without complaint today.    Past Medical History  Diagnosis Date  . Hypertension   . Glaucoma   . Hyperlipidemia   . OSA on CPAP 2012  . Chronic constipation   . Paroxysmal atrial fibrillation (HCC)     a. 04/2015 Tikosyn loading, DCCV x 1;  b. CHA2DS2VASc = 5-->Eliquis.  [name] CAD (coronary artery disease)     a. Cath 03/17/15 showing 100% ostial D1, 50% prox LAD to mid LAD, 40% RPDA stenosis. Med rx.  [name] NICM (nonischemic cardiomyopathy) ([place])   . Kidney stones     "found during prostate OR; still in there as far as I know" (05/04/2015)  . Prostate cancer ([place])   . Melanoma of neck ([place])     "right; came back IV; no chemo"  . Childhood asthma   . Chronic bronchitis ([place])     "had it several times til I quit smoking"  . Type II diabetes mellitus ([place])   . Arthritis     "hands probably" (05/04/2015)  . Chronic lower back pain   . History of gout    Past Surgical History  Procedure Laterality Date  . Shoulder open rotator cuff repair Right X 2  . Nm myocar perf wall motion  02/21/2012    EF 61% ,EXERCISE 7 METS. exercise stopped due to wheezing and shortness of breathe  . Doppler echocardiography  05/25/2009    EF 50-55%,LA mildly  dilated, LV function normal  . Carotid doppler  09/17/2008    rigt and left ICAs 0-49%;mildly  abnormal  . Cardiac catheterization N/A 03/17/2015    Procedure: Left Heart Cath and Coronary Angiography;  Surgeon: Lucas Harp, MD;  Location: [facility];  Service: Cardiovascular;  Laterality: N/A;  . Prostatectomy    . Electrophysiologic study N/A 04/05/2015    Procedure: Cardioversion;  Surgeon: Lucas Klein, MD;  Location: [facility];  Service: Cardiovascular;  Laterality: N/A;  . Laparoscopic cholecystectomy    . Hernia repair    . Abdominal hernia repair      w/mesh  . Excisional hemorrhoidectomy      "inside and out"  . Fine needle aspiration Right     knee; "drew ~ 1 quart off"  . Melanoma excision Right     "neck"  . Tee without cardioversion N/A 09/05/2015    Procedure: TRANSESOPHAGEAL ECHOCARDIOGRAM (TEE);  Surgeon: Lucas Margarita, MD;  Location: [facility] ENDOSCOPY;  Service: Cardiovascular;  Laterality: N/A;  . Electrophysiologic study N/A 09/06/2015    Procedure: Atrial Fibrillation Ablation;  Surgeon: Lucas Grayer, MD;  Location: [facility];  Service: Cardiovascular;  Laterality: N/A;     Current Outpatient Prescriptions  Medication Sig Dispense Refill  . apixaban (ELIQUIS) 5 MG TABS  tablet Take 1 tablet (5 mg total) by mouth 2 (two) times daily. 180 tablet 3  . calcium carbonate (TUMS) 500 MG chewable tablet Chew 1 tablet by mouth daily. Patient states that he uses as needed.    . carvedilol (COREG) 12.5 MG tablet Take 1 tablet (12.5 mg total) by mouth 2 (two) times daily with a meal. 180 tablet 3  . docusate sodium (COLACE) 100 MG capsule Take 1 capsule (100 mg total) by mouth at bedtime. 10 capsule 0  . furosemide (LASIX) 20 MG tablet Take 1 tablet (20 mg total) by mouth daily. 90 tablet 3  . gemfibrozil (LOPID) 600 MG tablet Take 600 mg by mouth at bedtime.     [name] glimepiride (AMARYL) 2 MG tablet Take 2 mg by mouth 2 (two) times daily.     [name]  latanoprost (XALATAN) 0.005 % ophthalmic solution Place 1 drop into both eyes at bedtime.    [name] lisinopril (PRINIVIL,ZESTRIL) 20 MG tablet Take 20 mg by mouth at bedtime.    . Magnesium Oxide 400 (240 Mg) MG TABS Take 400 mg by mouth daily. 30 tablet   . metFORMIN (GLUCOPHAGE) 500 MG tablet Take 1,000 mg by mouth 2 (two) times daily.    . NON FORMULARY Inject 22 mg into the skin daily. Diabetic medication - Pt unsure of name    . OMEGA-3 FATTY ACIDS PO Take 1,200 mg by mouth daily.    . Wheat Dextrin (BENEFIBER) CHEW Chew 2 each by mouth at bedtime.     No current facility-administered medications for this visit.    Allergies:   Tape   Social History:  The patient  reports that he quit smoking about 32 years ago. His smoking use included Cigarettes. He has a 54 pack-year smoking history. He has never used smokeless tobacco. He reports that he does not drink alcohol or use illicit drugs.   Family History:  The patient's  family history includes Diabetes in his father; Healthy in his daughter; Heart attack (age of onset: 64) in his mother; Heart disease in his father and mother; Lung cancer in his mother; Stroke in his brother.    ROS:  Please see the history of present illness.   All other systems are reviewed and negative.    PHYSICAL EXAM: VS:  BP 142/80 mmHg  Pulse 54  Ht 5\' 9"  (1.753 m)  Wt 241 lb 6.4 oz (109.498 kg)  BMI 35.63 kg/m2 , BMI Body mass index is 35.63 kg/(m^2). GEN: Well nourished, well developed, in no acute distress HEENT: normal Neck: no JVD, carotid bruits, or masses Cardiac: RRR; no murmurs, rubs, or gallops,no edema  Respiratory:  clear to auscultation bilaterally, normal work of breathing GI: soft, nontender, nondistended, + BS MS: no deformity or atrophy Skin: warm and dry  Neuro:  Strength and sensation are intact Psych: euthymic mood, full affect  EKG:  EKG today reveals sinus rhythm 54 bpm, PR 252 msec, incomplete RBBB  Recent Labs: 08/30/2015:  Hemoglobin 15.0; Platelets 233 11/22/2015: BUN 16; Creatinine, Ser 1.11; Magnesium 1.7; Potassium 4.3; Sodium 137    Lipid Panel  No results found for: CHOL, TRIG, HDL, CHOLHDL, VLDL, LDLCALC, LDLDIRECT   Wt Readings from Last 3 Encounters:  04/02/16 241 lb 6.4 oz (109.498 kg)  12/29/15 243 lb 3.2 oz (110.315 kg)  12/27/15 239 lb 4.8 oz (108.546 kg)     ASSESSMENT AND PLAN:  1.  Persistent atrial fibrillation Predominantly sinus rhythm Will continue current therapy Discussed repeat  ablation vs amiodarone as options Would consider implantable loop recorder for better characterization of arrhythmia prior to making any changes. At this time, he wishes to continue current plan.  2. Nonischemic CM/ chronic systolic dysfunction Repeat echo reviewed with patient EF has improved with sinus rhythm  3. HTN Stable No change required today  4. CAD No ischemic symptoms  5. OSA Followed by Dr Claiborne Billings  Current medicines are reviewed at length with the patient today.   The patient does not have concerns regarding his medicines.  The following changes were made today:  none  Return to see me in 62months  Signed, Lucas Grayer, MD  04/02/2016 4:53 PM     [address] [place] [address] [phone] (office) [phone] (fax)

## 2016-04-02 NOTE — Patient Instructions (Signed)

## 2016-04-26 ENCOUNTER — Other Ambulatory Visit: Payer: Self-pay | Admitting: Cardiovascular Disease

## 2016-05-08 ENCOUNTER — Encounter (HOSPITAL_COMMUNITY): Payer: Self-pay | Admitting: Emergency Medicine

## 2016-05-08 ENCOUNTER — Emergency Department (HOSPITAL_COMMUNITY)
Admission: EM | Admit: 2016-05-08 | Discharge: 2016-05-08 | Disposition: A | Discharge status: Home / Self Care | Payer: Medicare Other | Attending: Emergency Medicine | Admitting: Emergency Medicine

## 2016-05-08 ENCOUNTER — Emergency Department (HOSPITAL_COMMUNITY): Payer: Medicare Other

## 2016-05-08 DIAGNOSIS — R0602 Shortness of breath: Secondary | ICD-10-CM | POA: Insufficient documentation

## 2016-05-08 DIAGNOSIS — Z79899 Other long term (current) drug therapy: Secondary | ICD-10-CM | POA: Insufficient documentation

## 2016-05-08 DIAGNOSIS — M1990 Unspecified osteoarthritis, unspecified site: Secondary | ICD-10-CM | POA: Insufficient documentation

## 2016-05-08 DIAGNOSIS — I10 Essential (primary) hypertension: Secondary | ICD-10-CM | POA: Insufficient documentation

## 2016-05-08 DIAGNOSIS — E785 Hyperlipidemia, unspecified: Secondary | ICD-10-CM | POA: Insufficient documentation

## 2016-05-08 DIAGNOSIS — Z87891 Personal history of nicotine dependence: Secondary | ICD-10-CM | POA: Diagnosis not present

## 2016-05-08 DIAGNOSIS — I2510 Atherosclerotic heart disease of native coronary artery without angina pectoris: Secondary | ICD-10-CM | POA: Insufficient documentation

## 2016-05-08 LAB — COMPREHENSIVE METABOLIC PANEL
ALBUMIN: 4.2 g/dL (ref 3.5–5.0)
ALT: 13 U/L — ABNORMAL LOW (ref 17–63)
ANION GAP: 5 (ref 5–15)
AST: 15 U/L (ref 15–41)
Alkaline Phosphatase: 43 U/L (ref 38–126)
BUN: 20 mg/dL (ref 6–20)
CO2: 29 mmol/L (ref 22–32)
Calcium: 8.9 mg/dL (ref 8.9–10.3)
Chloride: 103 mmol/L (ref 101–111)
Creatinine, Ser: 0.98 mg/dL (ref 0.61–1.24)
GFR calc non Af Amer: >60 mL/min (ref >60)
GLUCOSE: 120 mg/dL — AB (ref 65–99)
POTASSIUM: 4.2 mmol/L (ref 3.5–5.1)
SODIUM: 137 mmol/L (ref 135–145)
Total Bilirubin: 1.7 mg/dL — ABNORMAL HIGH (ref 0.3–1.2)
Total Protein: 7.5 g/dL (ref 6.5–8.1)

## 2016-05-08 LAB — CBC WITH DIFFERENTIAL/PLATELET
BASOS PCT: 0 %
Basophils Absolute: 0 10*3/uL (ref 0.0–0.1)
EOS ABS: 0.2 10*3/uL (ref 0.0–0.7)
EOS PCT: 2 %
HCT: 40 % (ref 39.0–52.0)
HEMOGLOBIN: 13.7 g/dL (ref 13.0–17.0)
Lymphocytes Relative: 7 %
Lymphs Abs: 0.7 10*3/uL (ref 0.7–4.0)
MCH: 30.7 pg (ref 26.0–34.0)
MCHC: 34.3 g/dL (ref 30.0–36.0)
MCV: 89.7 fL (ref 78.0–100.0)
MONO ABS: 0.8 10*3/uL (ref 0.1–1.0)
MONOS PCT: 8 %
NEUTROS PCT: 83 %
Neutro Abs: 8.4 10*3/uL — ABNORMAL HIGH (ref 1.7–7.7)
PLATELETS: 183 10*3/uL (ref 150–400)
RBC: 4.46 MIL/uL (ref 4.22–5.81)
RDW: 13.3 % (ref 11.5–15.5)
WBC: 10.1 10*3/uL (ref 4.0–10.5)

## 2016-05-08 LAB — TROPONIN I
TROPONIN I: 0.03 ng/mL — AB (ref <0.03)
Troponin I: 0.03 ng/mL (ref <0.03)

## 2016-05-08 LAB — BRAIN NATRIURETIC PEPTIDE: B NATRIURETIC PEPTIDE 5: 336 pg/mL — AB (ref 0.0–100.0)

## 2016-05-08 LAB — D-DIMER, QUANTITATIVE (NOT AT ARMC): D DIMER QUANT: 0.46 ug{FEU}/mL (ref 0.00–0.50)

## 2016-05-08 MED ORDER — FUROSEMIDE 10 MG/ML IJ SOLN
40.0000 mg | Freq: Once | INTRAMUSCULAR | Status: AC
  Administered 2016-05-08: 40 mg via INTRAVENOUS
  Filled 2016-05-08: qty 4

## 2016-05-08 MED ORDER — RANOLAZINE ER 500 MG PO TB12
500.0000 mg | ORAL_TABLET | Freq: Once | ORAL | Status: AC
  Administered 2016-05-08: 500 mg via ORAL
  Filled 2016-05-08 (×2): qty 1

## 2016-05-08 MED ORDER — RANOLAZINE ER 500 MG PO TB12: 500.0000 mg | ORAL_TABLET | Freq: Two times a day (BID) | ORAL | 0 refills | Status: DC

## 2016-05-08 NOTE — ED Notes (Signed)
Pt continues to be agitated over wait time.  Explained ED process to pt.  Pt continues to talk on phone complaining about how long he has been here.  States he is getting hungry.  Advised pt we could get him some crackers, which he declined.

## 2016-05-08 NOTE — ED Notes (Signed)
CRITICAL VALUE ALERT  Critical value received:  Troponin 0.03  Date of notification:  05/08/16  Time of notification:  0824  Critical value read back:Yes.    Nurse who received alert:  Laurell Josephs RN  MD notified (1st page):  [place]  Time of first page:  0825  MD notified (2nd page):  Time of second page:  Responding MD:  Roderic Palau  Time MD responded:  [phone]

## 2016-05-08 NOTE — ED Provider Notes (Signed)
[facility] DEPT Provider Note   CSN: [id number] Arrival date & time: 05/08/16  [id number]  First Provider Contact:  First MD Initiated Contact with Patient 05/08/16 0757        History   Chief Complaint Chief Complaint  Patient presents with  . Shortness of Breath    HPI Lucas Dudley is a 75 y.o. male.  Patient complains of some shortness of breath. No chest pain   The history is provided by the patient.  Shortness of Breath  This is a new problem. The problem occurs frequently.The current episode started more than 2 days ago. The problem has not changed since onset.Pertinent negatives include no fever, no headaches, no cough, no chest pain, no abdominal pain and no rash. It is unknown what precipitated the problem. He has tried nothing for the symptoms. Associated medical issues include heart failure. Associated medical issues do not include asthma.    Past Medical History:  Diagnosis Date  . Arthritis    "hands probably" (05/04/2015)  . CAD (coronary artery disease)    a. Cath 03/17/15 showing 100% ostial D1, 50% prox LAD to mid LAD, 40% RPDA stenosis. Med rx.  . Childhood asthma   . Chronic bronchitis ([place])    "had it several times til I quit smoking"  . Chronic constipation   . Chronic lower back pain   . Glaucoma   . History of gout   . Hyperlipidemia   . Hypertension   . Kidney stones    "found during prostate OR; still in there as far as I know" (05/04/2015)  . Melanoma of neck ([place])    "right; came back IV; no chemo"  . NICM (nonischemic cardiomyopathy) ([place])   . OSA on CPAP 2012  . Paroxysmal atrial fibrillation (HCC)    a. 04/2015 Tikosyn loading, DCCV x 1;  b. CHA2DS2VASc = 5-->Eliquis.  . Prostate cancer ([place])   . Type II diabetes mellitus [facility])     Patient Active Problem List   Diagnosis Date Noted  . A-fib ([place]) 09/06/2015  . Chronic systolic dysfunction of left ventricle 07/02/2015  . Cardiomyopathy, dilated, nonischemic ([place]) 05/04/2015  . Severe  obstructive sleep apnea 04/25/2015  . Persistent atrial fibrillation ([place]) 04/04/2015  . Diabetes mellitus type 2, noninsulin dependent ([place])   . Chronic anticoagulation   . Abnormal stress test   . Shortness of breath 01/18/2015  . Paroxysmal atrial fibrillation ([place]) 07/06/2014  . Benign essential HTN 06/14/2014  . Calculus of kidney 06/14/2014  . Diabetes mellitus with no complication ([place]) [id number]  . Right flank pain 06/14/2014  . Blood in the urine 06/14/2014  . CA of prostate ([place]) 06/14/2014  . 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/14/2014  . Hypercholesterolemia without hypertriglyceridemia 06/14/2014  . Hematochezia 10/30/2013  . Essential hypertension 07/10/2013  . Hyperlipidemia 07/10/2013    Past Surgical History:  Procedure Laterality Date  . ABDOMINAL HERNIA REPAIR     w/mesh  . CARDIAC CATHETERIZATION N/A 03/17/2015   Procedure: Left Heart Cath and Coronary Angiography;  Surgeon: Lorretta Harp, MD;  Location: [facility];  Service: Cardiovascular;  Laterality: N/A;  . carotid doppler  09/17/2008   rigt and left ICAs 0-49%;mildly  abnormal  . DOPPLER ECHOCARDIOGRAPHY  05/25/2009   EF 50-55%,LA mildly dilated, LV function normal  . ELECTROPHYSIOLOGIC STUDY N/A 04/05/2015   Procedure: Cardioversion;  Surgeon: Sanda Klein, MD;  Location: [facility];  Service: Cardiovascular;  Laterality: N/A;  . ELECTROPHYSIOLOGIC STUDY  N/A 09/06/2015   Procedure: Atrial Fibrillation Ablation;  Surgeon: Thompson Grayer, MD;  Location: [facility];  Service: Cardiovascular;  Laterality: N/A;  . EXCISIONAL HEMORRHOIDECTOMY     "inside and out"  . FINE NEEDLE ASPIRATION Right    knee; "drew ~ 1 quart off"  . HERNIA REPAIR    . LAPAROSCOPIC CHOLECYSTECTOMY    . MELANOMA EXCISION Right    "neck"  . NM MYOCAR PERF WALL MOTION  02/21/2012   EF 61% ,EXERCISE 7 METS. exercise stopped due to wheezing and shortness of breathe  . PROSTATECTOMY    .  SHOULDER OPEN ROTATOR CUFF REPAIR Right X 2  . TEE WITHOUT CARDIOVERSION N/A 09/05/2015   Procedure: TRANSESOPHAGEAL ECHOCARDIOGRAM (TEE);  Surgeon: Sueanne Margarita, MD;  Location: [facility] ENDOSCOPY;  Service: Cardiovascular;  Laterality: N/A;       Home Medications    Prior to Admission medications   Medication Sig Start Date End Date Taking? Authorizing Provider  calcium carbonate (TUMS) 500 MG chewable tablet Chew 1 tablet by mouth daily. Patient states that he uses as needed.    Historical Provider, MD  carvedilol (COREG) 12.5 MG tablet Take 1 tablet (12.5 mg total) by mouth 2 (two) times daily with a meal. 12/29/15   Thompson Grayer, MD  docusate sodium (COLACE) 100 MG capsule Take 1 capsule (100 mg total) by mouth at bedtime. 12/27/15   Rogene Houston, MD  ELIQUIS 5 MG TABS tablet TAKE 1 TABLET TWICE A DAY 04/26/16   Lorretta Harp, MD  furosemide (LASIX) 20 MG tablet Take 1 tablet (20 mg total) by mouth daily. 03/22/16   Sherran Needs, NP  gemfibrozil (LOPID) 600 MG tablet Take 600 mg by mouth at bedtime.     Historical Provider, MD  glimepiride (AMARYL) 2 MG tablet Take 2 mg by mouth 2 (two) times daily.     Historical Provider, MD  latanoprost (XALATAN) 0.005 % ophthalmic solution Place 1 drop into both eyes at bedtime.    Historical Provider, MD  lisinopril (PRINIVIL,ZESTRIL) 20 MG tablet Take 20 mg by mouth at bedtime.    Historical Provider, MD  Magnesium Oxide 400 (240 Mg) MG TABS Take 400 mg by mouth daily. 01/30/16   Thompson Grayer, MD  metFORMIN (GLUCOPHAGE) 500 MG tablet Take 1,000 mg by mouth 2 (two) times daily.    Historical Provider, MD  NON FORMULARY Inject 22 mg into the skin daily. Diabetic medication - Pt unsure of name    Historical Provider, MD  OMEGA-3 FATTY ACIDS PO Take 1,200 mg by mouth daily.    Historical Provider, MD  ranolazine (RANEXA) 500 MG 12 hr tablet Take 1 tablet (500 mg total) by mouth 2 (two) times daily. 05/08/16   Milton Ferguson, MD  Wheat Dextrin (BENEFIBER)  CHEW Chew 2 each by mouth at bedtime.    Historical Provider, MD    Family History Family History  Problem Relation Age of Onset  . Heart disease Mother   . Lung cancer Mother   . Heart attack Mother 78  . Stroke Brother   . Healthy Daughter   . Diabetes Father   . Heart disease Father     Social History Social History  Substance Use Topics  . Smoking status: Former Smoker    Packs/day: 2.00    Years: 27.00    Types: Cigarettes    Quit date: 10/31/1983  . Smokeless tobacco: Never Used  . Alcohol use No  Comment: "used to drink; stopped ~ 2008"     Allergies   Tape   Review of Systems Review of Systems  Constitutional: Negative for appetite change, fatigue and fever.  HENT: Negative for congestion, ear discharge and sinus pressure.   Eyes: Negative for discharge.  Respiratory: Positive for shortness of breath. Negative for cough.   Cardiovascular: Negative for chest pain.  Gastrointestinal: Negative for abdominal pain and diarrhea.  Genitourinary: Negative for frequency and hematuria.  Musculoskeletal: Negative for back pain.  Skin: Negative for rash.  Neurological: Negative for seizures and headaches.  Psychiatric/Behavioral: Negative for hallucinations.     Physical Exam Updated Vital Signs BP 144/75   Pulse 69   Temp 98.5 F (36.9 C) (Oral)   Resp 16   Ht 5\' 10"  (1.778 m)   Wt 243 lb (110.2 kg)   SpO2 90%   BMI 34.87 kg/m   Physical Exam  Constitutional: He is oriented to person, place, and time. He appears well-developed.  HENT:  Head: Normocephalic.  Eyes: Conjunctivae and EOM are normal. No scleral icterus.  Neck: Neck supple. No thyromegaly present.  Cardiovascular: Normal rate and regular rhythm.  Exam reveals no gallop and no friction rub.   No murmur heard. Pulmonary/Chest: No stridor. He has no wheezes. He has no rales. He exhibits no tenderness.  Abdominal: He exhibits no distension. There is no tenderness. There is no rebound.    Musculoskeletal: Normal range of motion. He exhibits no edema.  Lymphadenopathy:    He has no cervical adenopathy.  Neurological: He is oriented to person, place, and time. He exhibits normal muscle tone. Coordination normal.  Skin: No rash noted. No erythema.  Psychiatric: He has a normal mood and affect. His behavior is normal.     ED Treatments / Results  Labs (all labs ordered are listed, but only abnormal results are displayed) Labs Reviewed  CBC WITH DIFFERENTIAL/PLATELET - Abnormal; Notable for the following:       Result Value   Neutro Abs 8.4 (*)    All other components within normal limits  COMPREHENSIVE METABOLIC PANEL - Abnormal; Notable for the following:    Glucose, Bld 120 (*)    ALT 13 (*)    Total Bilirubin 1.7 (*)    All other components within normal limits  TROPONIN I - Abnormal; Notable for the following:    Troponin I 0.03 (*)    All other components within normal limits  BRAIN NATRIURETIC PEPTIDE - Abnormal; Notable for the following:    B Natriuretic Peptide 336.0 (*)    All other components within normal limits  TROPONIN I - Abnormal; Notable for the following:    Troponin I 0.03 (*)    All other components within normal limits  D-DIMER, QUANTITATIVE (NOT AT [facility])    EKG  EKG Interpretation  Date/Time:  Tuesday May 08 2016 07:24:30 EDT Ventricular Rate:  78 PR Interval:    QRS Duration: 108 QT Interval:  449 QTC Calculation: 460 R Axis:   -42 Text Interpretation:  Sinus rhythm Supraventricular bigeminy Prolonged PR interval Incomplete RBBB and LAFB Confirmed by Kenzly Rogoff  MD, Bricelyn Freestone [phone]) on 05/08/2016 11:02:32 AM       Radiology Dg Chest 2 View  Result Date: 05/08/2016 CLINICAL DATA:  Shortness of breath on exertion and at rest. Symptoms are intermittent for 3 days. EXAM: CHEST  2 VIEW COMPARISON:  03/15/2015 FINDINGS: Chronic pleural thickening along the lateral right lower chest. Chronic elevation of the right hemidiaphragm.  Heart size  is within normal limits and stable. The trachea is midline. Lungs are clear without focal airspace disease or pulmonary edema. Evidence for an old fracture involving the left anterior seventh rib. IMPRESSION: No active cardiopulmonary disease. Electronically Signed   By: Markus Daft M.D.   On: 05/08/2016 08:03   Procedures Procedures (including critical care time)  Medications Ordered in ED Medications  ranolazine (RANEXA) 12 hr tablet 500 mg (not administered)  furosemide (LASIX) injection 40 mg (not administered)     Initial Impression / Assessment and Plan / ED Course  I have reviewed the triage vital signs and the nursing notes.  Pertinent labs & imaging results that were available during my care of the patient were reviewed by me and considered in my medical decision making (see chart for details).  Clinical Course   Patient's labs show mildly elevated troponin mildly elevated BNP. I spoke with cardiology and we will start a new medicine called Ranexa. Also patient's Lasix will be increased for a few days and he will follow-up with cardiology   Final Clinical Impressions(s) / ED Diagnoses   Final diagnoses:  SOB (shortness of breath)    New Prescriptions New Prescriptions   RANOLAZINE (RANEXA) 500 MG 12 HR TABLET    Take 1 tablet (500 mg total) by mouth 2 (two) times daily.     Milton Ferguson, MD 05/08/16 [phone]

## 2016-05-08 NOTE — ED Notes (Signed)
Pt provided with peanut butter crackers and diet gingerale.

## 2016-05-08 NOTE — ED Triage Notes (Signed)
PT c/o SOB on exertion and at rest intermittently x3 days. PT denies any chest pain.

## 2016-05-08 NOTE — ED Notes (Signed)
Pt extremely angry about how long he has been here.  Explained reasoning for repeat troponin level to pt, which made him a little calmer.  Dr. Roderic Palau made aware of pt's frustrations and will go speak with pt.  Pt made comment he thought he could just come over this morning and get a few tests ran.  Advised pt unfortunately wait times could be several hours for testing to be completed.

## 2016-05-08 NOTE — Discharge Instructions (Signed)
Follow up with your cardiologist or cardiology at [facility] next week.  Call and make the appointment

## 2016-05-08 NOTE — ED Notes (Signed)
Ambulated pt on RA.  Pt remains 90-91%

## 2016-05-08 NOTE — ED Notes (Signed)
Pt becoming very restless wanting to go home.  Talking on phone to multiple family members.  Advised pt his tests just resulted and the doctor would be in to talk with him shortly.

## 2016-05-16 ENCOUNTER — Encounter: Payer: Medicare Other | Admitting: Cardiology

## 2016-05-17 ENCOUNTER — Ambulatory Visit (INDEPENDENT_AMBULATORY_CARE_PROVIDER_SITE_OTHER): Payer: Medicare Other | Admitting: Cardiology

## 2016-05-17 ENCOUNTER — Encounter: Payer: Self-pay | Admitting: Cardiology

## 2016-05-17 VITALS — BP 130/80 | HR 84 | Ht 70.5 in | Wt 237.0 lb

## 2016-05-17 DIAGNOSIS — I480 Paroxysmal atrial fibrillation: Secondary | ICD-10-CM | POA: Diagnosis not present

## 2016-05-17 DIAGNOSIS — Z7901 Long term (current) use of anticoagulants: Secondary | ICD-10-CM

## 2016-05-17 DIAGNOSIS — I429 Cardiomyopathy, unspecified: Secondary | ICD-10-CM

## 2016-05-17 DIAGNOSIS — I25119 Atherosclerotic heart disease of native coronary artery with unspecified angina pectoris: Secondary | ICD-10-CM | POA: Insufficient documentation

## 2016-05-17 DIAGNOSIS — I5042 Chronic combined systolic (congestive) and diastolic (congestive) heart failure: Secondary | ICD-10-CM | POA: Insufficient documentation

## 2016-05-17 DIAGNOSIS — I2510 Atherosclerotic heart disease of native coronary artery without angina pectoris: Secondary | ICD-10-CM | POA: Insufficient documentation

## 2016-05-17 DIAGNOSIS — I5043 Acute on chronic combined systolic (congestive) and diastolic (congestive) heart failure: Secondary | ICD-10-CM | POA: Insufficient documentation

## 2016-05-17 DIAGNOSIS — I420 Dilated cardiomyopathy: Secondary | ICD-10-CM

## 2016-05-17 NOTE — Patient Instructions (Signed)
Your physician recommends that you schedule a follow-up appointment with Dr. Rayann Heman  Your physician recommends that you continue on your current medications as directed. Please refer to the Current Medication list given to you today.  Your physician recommends that you have lab work done. (BMP)   If you need a refill on your cardiac medications before your next appointment, please call your pharmacy.  Thank you for choosing [facility]!

## 2016-05-17 NOTE — Assessment & Plan Note (Signed)
His EF is OK as long as he is in NSR

## 2016-05-17 NOTE — Assessment & Plan Note (Signed)
Eliquis 

## 2016-05-17 NOTE — Assessment & Plan Note (Signed)
Seen in the ED 05/08/16/ He is improved with the addition of Ranexa and increased Lasix

## 2016-05-17 NOTE — Progress Notes (Signed)
05/17/2016 Lucas Dudley   1941-04-15  [id number]  Primary Physician Robert Bellow, MD Primary Cardiologist: Dr Gwenlyn Found Dr Rayann Heman  HPI:  75 y/o male with a history of moderate CAD and PAF. He does well when he is in NSR but has had CHF and depressed EF when in AF. He is followed by Dr Rayann Heman. He has had RFA and previous cardioversion. He had been doing well until a few weeks ago when he noted increasing DOE and orthopnea. He went to the ED and was noted to be in NSR with PACS. He was given one dose of Lasix IV and his daily dose was increased. Ranexa was also added. He is in the office today for follow up. He says he is doing better. EKG today does show AF with VR 90.    Current Outpatient Prescriptions  Medication Sig Dispense Refill  . calcium carbonate (TUMS) 500 MG chewable tablet Chew 1 tablet by mouth daily. Patient states that he uses as needed.    . carvedilol (COREG) 12.5 MG tablet Take 1 tablet (12.5 mg total) by mouth 2 (two) times daily with a meal. 180 tablet 3  . docusate sodium (COLACE) 100 MG capsule Take 1 capsule (100 mg total) by mouth at bedtime. 10 capsule 0  . ELIQUIS 5 MG TABS tablet TAKE 1 TABLET TWICE A DAY 180 tablet 3  . furosemide (LASIX) 20 MG tablet Take 1 tablet (20 mg total) by mouth daily. 90 tablet 3  . gemfibrozil (LOPID) 600 MG tablet Take 600 mg by mouth at bedtime.     [name] glimepiride (AMARYL) 2 MG tablet Take 2 mg by mouth 2 (two) times daily.     [name] latanoprost (XALATAN) 0.005 % ophthalmic solution Place 1 drop into both eyes at bedtime.    [name] lisinopril (PRINIVIL,ZESTRIL) 20 MG tablet Take 20 mg by mouth at bedtime.    . Magnesium Oxide 400 (240 Mg) MG TABS Take 400 mg by mouth daily. 30 tablet   . metFORMIN (GLUCOPHAGE) 500 MG tablet Take 1,000 mg by mouth 2 (two) times daily.    . OMEGA-3 FATTY ACIDS PO Take 1,200 mg by mouth daily.    . ranolazine (RANEXA) 500 MG 12 hr tablet Take 1 tablet (500 mg total) by mouth 2 (two) times daily. 60 tablet 0    . Wheat Dextrin (BENEFIBER) CHEW Chew 2 each by mouth at bedtime.     No current facility-administered medications for this visit.     Allergies  Allergen Reactions  . Tape Rash and Other (See Comments)    Causes skin redness, Use paper tape only.    Social History   Social History  . Marital status: Married    Spouse name: N/A  . Number of children: N/A  . Years of education: N/A   Occupational History  . Retired    Social History Main Topics  . Smoking status: Former Smoker    Packs/day: 2.00    Years: 27.00    Types: Cigarettes    Quit date: 10/31/1983  . Smokeless tobacco: Never Used  . Alcohol use No     Comment: "used to drink; stopped ~ 2008"  . Drug use: No  . Sexual activity: Not Currently   Other Topics Concern  . Not on file   Social History Narrative   Lives in [place], [place] with wife.     Review of Systems: General: negative for chills, fever, night sweats or weight changes.  Cardiovascular: negative for chest pain, dyspnea on exertion, edema, orthopnea, palpitations, paroxysmal nocturnal dyspnea or shortness of breath Dermatological: negative for rash Respiratory: negative for cough or wheezing Urologic: negative for hematuria Abdominal: negative for nausea, vomiting, diarrhea, bright red blood per rectum, melena, or hematemesis Neurologic: negative for visual changes, syncope, or dizziness All other systems reviewed and are otherwise negative except as noted above.    Blood pressure 130/80, pulse 84, height 5' 10.5" (1.791 m), weight 237 lb (107.5 kg), SpO2 97 %.  General appearance: alert, cooperative and no distress Neck: no carotid bruit and no JVD Lungs: clear to auscultation bilaterally Heart: irregularly irregular rhythm Abdomen: ventral hernia Extremities: no edema Skin: Skin color, texture, turgor normal. No rashes or lesions Neurologic: Grossly normal  EKG AF with VR 90  ASSESSMENT AND PLAN:   Acute on chronic combined systolic  and diastolic CHF (congestive heart failure) (HCC) Seen in the ED 05/08/16/ He is improved with the addition of Ranexa and increased Lasix  PAF (paroxysmal atrial fibrillation) (HCC) Back in AF today- (NSR with PACs when seen ion the ED 7/25)  Cardiomyopathy, dilated, nonischemic His EF is OK as long as he is in NSR  Chronic anticoagulation Eliquis  CAD in native artery Total Dx, 50% LAD at cath 2016   PLAN  [name] has an appointment with Dr Rayann Heman in Sept and he will keep that. I suggested he could stop his Ranexa-I doubt ischemic CAD playing a role here. I suspect he has been going in and out of AF and that led to his recent CHF episode. I did order a BMP today on the increased Lasix dose.   Kerin Ransom PA-C 05/17/2016 4:34 PM

## 2016-05-17 NOTE — Assessment & Plan Note (Signed)
Total Dx, 50% LAD at cath 2016

## 2016-05-17 NOTE — Assessment & Plan Note (Signed)
Back in AF today- (NSR with PACs when seen ion the ED 7/25)

## 2016-05-18 LAB — BASIC METABOLIC PANEL
BUN: 21 mg/dL (ref 7–25)
CO2: 27 mmol/L (ref 20–31)
Calcium: 9.6 mg/dL (ref 8.6–10.3)
Chloride: 99 mmol/L (ref 98–110)
Creat: 1.23 mg/dL — ABNORMAL HIGH (ref 0.70–1.18)
Glucose, Bld: 78 mg/dL (ref 65–99)
Potassium: 4.8 mmol/L (ref 3.5–5.3)
Sodium: 139 mmol/L (ref 135–146)

## 2016-07-04 ENCOUNTER — Encounter: Payer: Self-pay | Admitting: Internal Medicine

## 2016-07-04 ENCOUNTER — Ambulatory Visit (INDEPENDENT_AMBULATORY_CARE_PROVIDER_SITE_OTHER): Payer: Medicare Other | Admitting: Internal Medicine

## 2016-07-04 VITALS — BP 122/74 | HR 86 | Ht 70.5 in | Wt 241.0 lb

## 2016-07-04 DIAGNOSIS — Z7901 Long term (current) use of anticoagulants: Secondary | ICD-10-CM | POA: Diagnosis not present

## 2016-07-04 DIAGNOSIS — I481 Persistent atrial fibrillation: Secondary | ICD-10-CM | POA: Diagnosis not present

## 2016-07-04 DIAGNOSIS — I480 Paroxysmal atrial fibrillation: Secondary | ICD-10-CM

## 2016-07-04 DIAGNOSIS — I519 Heart disease, unspecified: Secondary | ICD-10-CM | POA: Diagnosis not present

## 2016-07-04 DIAGNOSIS — I4819 Other persistent atrial fibrillation: Secondary | ICD-10-CM

## 2016-07-04 NOTE — Patient Instructions (Signed)
Medication Instructions:  Your physician recommends that you continue on your current medications as directed. Please refer to the Current Medication list given to you today.    Labwork: Your physician recommends that you return for lab work on Fri 07/06/16   Testing/Procedures: Your physician has requested that you have cardiac CT. Cardiac computed tomography (CT) is a painless test that uses an x-ray machine to take clear, detailed pictures of your heart. For further information please visit [facility]. Please follow instruction sheet as given. Tues 07/10/16-- see instruction sheet   Your physician has recommended that you have an ablation. Catheter ablation is a medical procedure used to treat some cardiac arrhythmias (irregular heartbeats). During catheter ablation, a long, thin, flexible tube is put into a blood vessel in your groin (upper thigh), or neck. This tube is called an ablation catheter. It is then guided to your heart through the blood vessel. Radio frequency waves destroy small areas of heart tissue where abnormal heartbeats may cause an arrhythmia to start. Please see the instruction sheet given to you today.--07/12/16   Please arrive at [facility] at 5:30am Do not eat or drink after midnight the night prior to the procedure Do not take any medications the morning of the procedure Plan for one night stay    Follow-Up: Your physician recommends that you schedule a follow-up appointment in: 4 weeks from 07/12/16 with Roderic Palau, NP and 3 months from 07/12/16 with Dr Rayann Heman   Any Other Special Instructions Will Be Listed Below (If Applicable).     If you need a refill on your cardiac medications before your next appointment, please call your pharmacy.

## 2016-07-05 NOTE — Progress Notes (Signed)
Electrophysiology Office Note   Date:  07/05/2016   ID:  POLLARD NOU, DOB 04-13-1941, MRN [id number]  PCP:  Robert Bellow, MD  Cardiologist:  Dr Gwenlyn Found Primary Electrophysiologist: Thompson Grayer, MD    Chief Complaint  Patient presents with  . Atrial Fibrillation     History of Present Illness: Lucas Dudley is a 75 y.o. male who presents today for electrophysiology evaluation.  He has returned to afib  He has fatigue and decreased exercise tolerance.  Today, he denies symptoms of palpitations, chest pain,  orthopnea, PND, lower extremity edema, claudication, dizziness, presyncope, syncope, bleeding, or neurologic sequela. The patient is tolerating medications without difficulties and is otherwise without complaint today.    Past Medical History:  Diagnosis Date  . Arthritis    "hands probably" (05/04/2015)  . CAD (coronary artery disease)    a. Cath 03/17/15 showing 100% ostial D1, 50% prox LAD to mid LAD, 40% RPDA stenosis. Med rx.  . Childhood asthma   . Chronic bronchitis ([place])    "had it several times til I quit smoking"  . Chronic constipation   . Chronic lower back pain   . Glaucoma   . History of gout   . Hyperlipidemia   . Hypertension   . Kidney stones    "found during prostate OR; still in there as far as I know" (05/04/2015)  . Melanoma of neck ([place])    "right; came back IV; no chemo"  . NICM (nonischemic cardiomyopathy) ([place])   . OSA on CPAP 2012  . Paroxysmal atrial fibrillation (HCC)    a. 04/2015 Tikosyn loading, DCCV x 1;  b. CHA2DS2VASc = 5-->Eliquis.  . Prostate cancer ([place])   . Type II diabetes mellitus ([place])    Past Surgical History:  Procedure Laterality Date  . ABDOMINAL HERNIA REPAIR     w/mesh  . CARDIAC CATHETERIZATION N/A 03/17/2015   Procedure: Left Heart Cath and Coronary Angiography;  Surgeon: Lorretta Harp, MD;  Location: [facility];  Service: Cardiovascular;  Laterality: N/A;  . carotid doppler  09/17/2008   rigt and left  ICAs 0-49%;mildly  abnormal  . DOPPLER ECHOCARDIOGRAPHY  05/25/2009   EF 50-55%,LA mildly dilated, LV function normal  . ELECTROPHYSIOLOGIC STUDY N/A 04/05/2015   Procedure: Cardioversion;  Surgeon: Sanda Klein, MD;  Location: [facility];  Service: Cardiovascular;  Laterality: N/A;  . ELECTROPHYSIOLOGIC STUDY N/A 09/06/2015   Procedure: Atrial Fibrillation Ablation;  Surgeon: Thompson Grayer, MD;  Location: [facility];  Service: Cardiovascular;  Laterality: N/A;  . EXCISIONAL HEMORRHOIDECTOMY     "inside and out"  . FINE NEEDLE ASPIRATION Right    knee; "drew ~ 1 quart off"  . HERNIA REPAIR    . LAPAROSCOPIC CHOLECYSTECTOMY    . MELANOMA EXCISION Right    "neck"  . NM MYOCAR PERF WALL MOTION  02/21/2012   EF 61% ,EXERCISE 7 METS. exercise stopped due to wheezing and shortness of breathe  . PROSTATECTOMY    . SHOULDER OPEN ROTATOR CUFF REPAIR Right X 2  . TEE WITHOUT CARDIOVERSION N/A 09/05/2015   Procedure: TRANSESOPHAGEAL ECHOCARDIOGRAM (TEE);  Surgeon: Sueanne Margarita, MD;  Location[facility];  Service: Cardiovascular;  Laterality: N/A;     Current Outpatient Prescriptions  Medication Sig Dispense Refill  . apixaban (ELIQUIS) 5 MG TABS tablet Take 5 mg by mouth 2 (two) times daily.    . calcium carbonate (TUMS) 500 MG chewable tablet Chew 1 tablet by  mouth as directed. Use as needed    . carvedilol (COREG) 12.5 MG tablet Take 1 tablet (12.5 mg total) by mouth 2 (two) times daily with a meal. 180 tablet 3  . docusate sodium (COLACE) 100 MG capsule Take 100 mg by mouth daily as needed (constipation).    . furosemide (LASIX) 20 MG tablet Take 1 tablet (20 mg total) by mouth daily. 90 tablet 3  . gemfibrozil (LOPID) 600 MG tablet Take 600 mg by mouth at bedtime.     [name] glimepiride (AMARYL) 2 MG tablet Take 2 mg by mouth 2 (two) times daily.     . Insulin Glargine (LANTUS ) Inject 20 Units into the skin at bedtime.    [name] latanoprost (XALATAN) 0.005 % ophthalmic  solution Place 1 drop into both eyes at bedtime.    [name] lisinopril (PRINIVIL,ZESTRIL) 20 MG tablet Take 20 mg by mouth every morning.     . Magnesium Oxide 400 (240 Mg) MG TABS Take 400 mg by mouth daily. 30 tablet   . metFORMIN (GLUCOPHAGE) 500 MG tablet Take 1,000 mg by mouth 2 (two) times daily.     No current facility-administered medications for this visit.     Allergies:   Tape   Social History:  The patient  reports that he quit smoking about 32 years ago. His smoking use included Cigarettes. He has a 54.00 pack-year smoking history. He has never used smokeless tobacco. He reports that he does not drink alcohol or use drugs.   Family History:  The patient's  family history includes Diabetes in his father; Healthy in his daughter; Heart attack (age of onset: 54) in his mother; Heart disease in his father and mother; Lung cancer in his mother; Stroke in his brother.    ROS:  Please see the history of present illness.   All other systems are reviewed and negative.    PHYSICAL EXAM: VS:  BP 122/74   Pulse 86   Ht 5' 10.5" (1.791 m)   Wt 241 lb (109.3 kg)   BMI 34.09 kg/m  , BMI Body mass index is 34.09 kg/m. GEN: Well nourished, well developed, in no acute distress  HEENT: normal  Neck: no JVD, carotid bruits, or masses Cardiac: iRRR; no murmurs, rubs, or gallops,no edema  Respiratory:  clear to auscultation bilaterally, normal work of breathing GI: soft, nontender, nondistended, + BS MS: no deformity or atrophy  Skin: warm and dry  Neuro:  Strength and sensation are intact Psych: euthymic mood, full affect  EKG:  EKG today reveals sinus rhythm 86 bpm,  incomplete RBBB, LAHB  Recent Labs: 11/22/2015: Magnesium 1.7 05/08/2016: ALT 13; B Natriuretic Peptide 336.0; Hemoglobin 13.7; Platelets 183 05/17/2016: BUN 21; Creat 1.23; Potassium 4.8; Sodium 139    Lipid Panel  No results found for: CHOL, TRIG, HDL, CHOLHDL, VLDL, LDLCALC, LDLDIRECT   Wt Readings from Last 3  Encounters:  07/04/16 241 lb (109.3 kg)  05/17/16 237 lb (107.5 kg)  05/08/16 243 lb (110.2 kg)     ASSESSMENT AND PLAN:  1.  Persistent atrial fibrillation He has returned to atrial fibrillation. Therapeutic strategies for afib including medicine and repeat ablation were discussed in detail with the patient today. Risk, benefits, and alternatives to EP study and radiofrequency ablation for afib were also discussed in detail today. These risks include but are not limited to stroke, bleeding, vascular damage, tamponade, perforation, damage to the esophagus, lungs, and other structures, pulmonary vein stenosis, worsening renal function, and death.  The patient understands these risk and wishes to proceed.  We will therefore proceed with catheter ablation at the next available time.  Will obtain cardiac CT prior to ablation.  2. Nonischemic CM/ chronic systolic dysfunction EF previously improved with sinus  3. HTN Stable No change required today  4. CAD No ischemic symptoms  5. OSA Followed by Dr Claiborne Billings  Current medicines are reviewed at length with the patient today.   The patient does not have concerns regarding his medicines.  The following changes were made today:  none    Signed, Thompson Grayer, MD  07/05/2016 4:57 PM     [address] [place] [address] [phone] (office) [phone] (fax)

## 2016-07-06 ENCOUNTER — Other Ambulatory Visit (INDEPENDENT_AMBULATORY_CARE_PROVIDER_SITE_OTHER): Payer: Medicare Other

## 2016-07-06 DIAGNOSIS — I480 Paroxysmal atrial fibrillation: Secondary | ICD-10-CM

## 2016-07-06 LAB — CBC WITH DIFFERENTIAL/PLATELET
BASOS PCT: 0 %
Basophils Absolute: 0 cells/uL (ref 0–200)
EOS ABS: 244 {cells}/uL (ref 15–500)
Eosinophils Relative: 4 %
HCT: 41.9 % (ref 38.5–50.0)
Hemoglobin: 14.2 g/dL (ref 13.2–17.1)
LYMPHS PCT: 26 %
Lymphs Abs: 1586 cells/uL (ref 850–3900)
MCH: 29.5 pg (ref 27.0–33.0)
MCHC: 33.9 g/dL (ref 32.0–36.0)
MCV: 87.1 fL (ref 80.0–100.0)
MONO ABS: 610 {cells}/uL (ref 200–950)
MONOS PCT: 10 %
MPV: 10.8 fL (ref 7.5–12.5)
NEUTROS ABS: 3660 {cells}/uL (ref 1500–7800)
Neutrophils Relative %: 60 %
PLATELETS: 226 10*3/uL (ref 140–400)
RBC: 4.81 MIL/uL (ref 4.20–5.80)
RDW: 14.1 % (ref 11.0–15.0)
WBC: 6.1 10*3/uL (ref 3.8–10.8)

## 2016-07-07 LAB — BASIC METABOLIC PANEL
BUN: 21 mg/dL (ref 7–25)
CHLORIDE: 100 mmol/L (ref 98–110)
CO2: 27 mmol/L (ref 20–31)
Calcium: 9.2 mg/dL (ref 8.6–10.3)
Creat: 1.03 mg/dL (ref 0.70–1.18)
Glucose, Bld: 215 mg/dL — ABNORMAL HIGH (ref 65–99)
POTASSIUM: 4.4 mmol/L (ref 3.5–5.3)
Sodium: 139 mmol/L (ref 135–146)

## 2016-07-09 ENCOUNTER — Other Ambulatory Visit: Payer: Medicare Other

## 2016-07-10 ENCOUNTER — Ambulatory Visit (HOSPITAL_COMMUNITY)
Admission: RE | Admit: 2016-07-10 | Discharge: 2016-07-10 | Disposition: A | Discharge status: Home / Self Care | Payer: Medicare Other | Source: Ambulatory Visit | Attending: Internal Medicine | Admitting: Internal Medicine

## 2016-07-10 DIAGNOSIS — I480 Paroxysmal atrial fibrillation: Secondary | ICD-10-CM

## 2016-07-10 DIAGNOSIS — K760 Fatty (change of) liver, not elsewhere classified: Secondary | ICD-10-CM | POA: Insufficient documentation

## 2016-07-10 DIAGNOSIS — I700 Atherosclerosis of aorta: Secondary | ICD-10-CM | POA: Insufficient documentation

## 2016-07-10 MED ORDER — METOPROLOL TARTRATE 5 MG/5ML IV SOLN
INTRAVENOUS | Status: AC
Start: 2016-07-10 — End: 2016-07-10
  Administered 2016-07-10: 5 mg via INTRAVENOUS
  Filled 2016-07-10: qty 5

## 2016-07-10 MED ORDER — IOPAMIDOL (ISOVUE-370) INJECTION 76%
INTRAVENOUS | Status: AC
  Administered 2016-07-10: 80 mL
  Filled 2016-07-10: qty 100

## 2016-07-10 MED ORDER — METOPROLOL TARTRATE 5 MG/5ML IV SOLN
5.0000 mg | INTRAVENOUS | Status: DC | PRN
  Administered 2016-07-10 (×2): 5 mg via INTRAVENOUS
  Filled 2016-07-10: qty 5

## 2016-07-10 MED ORDER — NITROGLYCERIN 0.4 MG SL SUBL
SUBLINGUAL_TABLET | SUBLINGUAL | Status: AC
  Administered 2016-07-10: 0.4 mg via SUBLINGUAL
  Filled 2016-07-10: qty 1

## 2016-07-10 MED ORDER — NITROGLYCERIN 0.4 MG SL SUBL
0.4000 mg | SUBLINGUAL_TABLET | SUBLINGUAL | Status: DC | PRN
  Administered 2016-07-10: 0.4 mg via SUBLINGUAL
  Filled 2016-07-10: qty 25

## 2016-07-12 ENCOUNTER — Encounter (HOSPITAL_COMMUNITY): Payer: Self-pay | Admitting: Anesthesiology

## 2016-07-12 ENCOUNTER — Ambulatory Visit (HOSPITAL_COMMUNITY): Payer: Medicare Other | Admitting: Anesthesiology

## 2016-07-12 ENCOUNTER — Ambulatory Visit (HOSPITAL_COMMUNITY)
Admission: RE | Admit: 2016-07-12 | Discharge: 2016-07-13 | Disposition: A | Discharge status: Home / Self Care | Payer: Medicare Other | Source: Ambulatory Visit | Attending: Internal Medicine | Admitting: Internal Medicine

## 2016-07-12 ENCOUNTER — Encounter (HOSPITAL_COMMUNITY)
Admission: RE | Disposition: A | Discharge status: Home / Self Care | Payer: Self-pay | Source: Ambulatory Visit | Attending: Internal Medicine

## 2016-07-12 DIAGNOSIS — H409 Unspecified glaucoma: Secondary | ICD-10-CM | POA: Insufficient documentation

## 2016-07-12 DIAGNOSIS — E785 Hyperlipidemia, unspecified: Secondary | ICD-10-CM | POA: Insufficient documentation

## 2016-07-12 DIAGNOSIS — G8929 Other chronic pain: Secondary | ICD-10-CM | POA: Insufficient documentation

## 2016-07-12 DIAGNOSIS — M109 Gout, unspecified: Secondary | ICD-10-CM | POA: Insufficient documentation

## 2016-07-12 DIAGNOSIS — Z87891 Personal history of nicotine dependence: Secondary | ICD-10-CM | POA: Diagnosis not present

## 2016-07-12 DIAGNOSIS — I4891 Unspecified atrial fibrillation: Secondary | ICD-10-CM | POA: Diagnosis present

## 2016-07-12 DIAGNOSIS — Z823 Family history of stroke: Secondary | ICD-10-CM | POA: Diagnosis not present

## 2016-07-12 DIAGNOSIS — M545 Low back pain: Secondary | ICD-10-CM | POA: Insufficient documentation

## 2016-07-12 DIAGNOSIS — Z833 Family history of diabetes mellitus: Secondary | ICD-10-CM | POA: Diagnosis not present

## 2016-07-12 DIAGNOSIS — M1990 Unspecified osteoarthritis, unspecified site: Secondary | ICD-10-CM | POA: Diagnosis not present

## 2016-07-12 DIAGNOSIS — I428 Other cardiomyopathies: Secondary | ICD-10-CM | POA: Insufficient documentation

## 2016-07-12 DIAGNOSIS — Z87442 Personal history of urinary calculi: Secondary | ICD-10-CM | POA: Insufficient documentation

## 2016-07-12 DIAGNOSIS — J42 Unspecified chronic bronchitis: Secondary | ICD-10-CM | POA: Insufficient documentation

## 2016-07-12 DIAGNOSIS — G4733 Obstructive sleep apnea (adult) (pediatric): Secondary | ICD-10-CM | POA: Insufficient documentation

## 2016-07-12 DIAGNOSIS — Z7901 Long term (current) use of anticoagulants: Secondary | ICD-10-CM | POA: Diagnosis not present

## 2016-07-12 DIAGNOSIS — Z8546 Personal history of malignant neoplasm of prostate: Secondary | ICD-10-CM | POA: Diagnosis not present

## 2016-07-12 DIAGNOSIS — Z8249 Family history of ischemic heart disease and other diseases of the circulatory system: Secondary | ICD-10-CM | POA: Diagnosis not present

## 2016-07-12 DIAGNOSIS — Z7984 Long term (current) use of oral hypoglycemic drugs: Secondary | ICD-10-CM | POA: Insufficient documentation

## 2016-07-12 DIAGNOSIS — E119 Type 2 diabetes mellitus without complications: Secondary | ICD-10-CM | POA: Insufficient documentation

## 2016-07-12 DIAGNOSIS — I10 Essential (primary) hypertension: Secondary | ICD-10-CM | POA: Insufficient documentation

## 2016-07-12 DIAGNOSIS — Z794 Long term (current) use of insulin: Secondary | ICD-10-CM | POA: Insufficient documentation

## 2016-07-12 DIAGNOSIS — I2510 Atherosclerotic heart disease of native coronary artery without angina pectoris: Secondary | ICD-10-CM | POA: Diagnosis not present

## 2016-07-12 DIAGNOSIS — I481 Persistent atrial fibrillation: Secondary | ICD-10-CM | POA: Diagnosis not present

## 2016-07-12 DIAGNOSIS — Z85820 Personal history of malignant melanoma of skin: Secondary | ICD-10-CM | POA: Insufficient documentation

## 2016-07-12 DIAGNOSIS — K5909 Other constipation: Secondary | ICD-10-CM | POA: Diagnosis not present

## 2016-07-12 DIAGNOSIS — Z801 Family history of malignant neoplasm of trachea, bronchus and lung: Secondary | ICD-10-CM | POA: Diagnosis not present

## 2016-07-12 HISTORY — PX: ELECTROPHYSIOLOGIC STUDY: SHX172A

## 2016-07-12 LAB — POCT ACTIVATED CLOTTING TIME
ACTIVATED CLOTTING TIME: 241 s
ACTIVATED CLOTTING TIME: 274 s
Activated Clotting Time: 241 seconds
Activated Clotting Time: 180 seconds

## 2016-07-12 LAB — GLUCOSE, CAPILLARY
Glucose-Capillary: 175 mg/dL — ABNORMAL HIGH (ref 65–99)
Glucose-Capillary: 152 mg/dL — ABNORMAL HIGH (ref 65–99)
Glucose-Capillary: 169 mg/dL — ABNORMAL HIGH (ref 65–99)
Glucose-Capillary: 194 mg/dL — ABNORMAL HIGH (ref 65–99)

## 2016-07-12 LAB — MRSA PCR SCREENING: MRSA BY PCR: NEGATIVE

## 2016-07-12 SURGERY — ATRIAL FIBRILLATION ABLATION
Anesthesia: Monitor Anesthesia Care

## 2016-07-12 MED ORDER — PROMETHAZINE HCL 25 MG/ML IJ SOLN: 6.2500 mg | INTRAMUSCULAR | Status: DC | PRN

## 2016-07-12 MED ORDER — IOPAMIDOL (ISOVUE-370) INJECTION 76%
INTRAVENOUS | Status: DC | PRN
  Administered 2016-07-12: 5 mL via INTRAVENOUS

## 2016-07-12 MED ORDER — ACETAMINOPHEN 325 MG PO TABS
650.0000 mg | ORAL_TABLET | ORAL | Status: DC | PRN
  Administered 2016-07-13: 650 mg via ORAL
  Filled 2016-07-12: qty 2

## 2016-07-12 MED ORDER — PHENYLEPHRINE HCL 10 MG/ML IJ SOLN
INTRAMUSCULAR | Status: DC | PRN
  Administered 2016-07-12: 80 ug via INTRAVENOUS
  Administered 2016-07-12 (×2): 120 ug via INTRAVENOUS
  Administered 2016-07-12: 80 ug via INTRAVENOUS
  Administered 2016-07-12: 120 ug via INTRAVENOUS
  Administered 2016-07-12: 80 ug via INTRAVENOUS
  Administered 2016-07-12: 120 ug via INTRAVENOUS
  Administered 2016-07-12 (×3): 80 ug via INTRAVENOUS

## 2016-07-12 MED ORDER — LATANOPROST 0.005 % OP SOLN
1.0000 [drp] | Freq: Every day | OPHTHALMIC | Status: DC
  Administered 2016-07-12: 1 [drp] via OPHTHALMIC
  Filled 2016-07-12: qty 2.5

## 2016-07-12 MED ORDER — BUPIVACAINE HCL (PF) 0.25 % IJ SOLN
INTRAMUSCULAR | Status: AC
  Filled 2016-07-12: qty 60

## 2016-07-12 MED ORDER — MIDAZOLAM HCL 5 MG/5ML IJ SOLN
INTRAMUSCULAR | Status: DC | PRN
  Administered 2016-07-12: 2 mg via INTRAVENOUS

## 2016-07-12 MED ORDER — HEPARIN (PORCINE) IN NACL 2-0.9 UNIT/ML-% IJ SOLN
INTRAMUSCULAR | Status: AC
  Filled 2016-07-12: qty 500

## 2016-07-12 MED ORDER — EPHEDRINE SULFATE 50 MG/ML IJ SOLN
INTRAMUSCULAR | Status: DC | PRN
  Administered 2016-07-12 (×5): 10 mg via INTRAVENOUS

## 2016-07-12 MED ORDER — HEPARIN SODIUM (PORCINE) 1000 UNIT/ML IJ SOLN
INTRAMUSCULAR | Status: DC | PRN
  Administered 2016-07-12: 12000 [IU] via INTRAVENOUS
  Administered 2016-07-12: 3000 [IU] via INTRAVENOUS
  Administered 2016-07-12: 5000 [IU] via INTRAVENOUS
  Administered 2016-07-12: 4000 [IU] via INTRAVENOUS

## 2016-07-12 MED ORDER — HEPARIN SODIUM (PORCINE) 1000 UNIT/ML IJ SOLN
INTRAMUSCULAR | Status: DC | PRN
  Administered 2016-07-12: 2000 [IU] via INTRAVENOUS

## 2016-07-12 MED ORDER — ONDANSETRON HCL 4 MG/2ML IJ SOLN: 4.0000 mg | Freq: Four times a day (QID) | INTRAMUSCULAR | Status: DC | PRN

## 2016-07-12 MED ORDER — SODIUM CHLORIDE 0.9% FLUSH: 3.0000 mL | INTRAVENOUS | Status: DC | PRN

## 2016-07-12 MED ORDER — SODIUM CHLORIDE 0.9 % IV SOLN
INTRAVENOUS | Status: DC
  Administered 2016-07-12 (×3): via INTRAVENOUS

## 2016-07-12 MED ORDER — SODIUM CHLORIDE 0.9% FLUSH
3.0000 mL | Freq: Two times a day (BID) | INTRAVENOUS | Status: DC
  Administered 2016-07-12 (×2): 3 mL via INTRAVENOUS

## 2016-07-12 MED ORDER — GLIMEPIRIDE 2 MG PO TABS
2.0000 mg | ORAL_TABLET | Freq: Two times a day (BID) | ORAL | Status: DC
  Administered 2016-07-12 – 2016-07-13 (×2): 2 mg via ORAL
  Filled 2016-07-12 (×2): qty 1

## 2016-07-12 MED ORDER — LIDOCAINE HCL (CARDIAC) 20 MG/ML IV SOLN
INTRAVENOUS | Status: DC | PRN
  Administered 2016-07-12: 40 mg via INTRAVENOUS

## 2016-07-12 MED ORDER — IOPAMIDOL (ISOVUE-370) INJECTION 76%
INTRAVENOUS | Status: AC
Start: 2016-07-12 — End: 2016-07-12
  Filled 2016-07-12: qty 50

## 2016-07-12 MED ORDER — FENTANYL CITRATE (PF) 100 MCG/2ML IJ SOLN
INTRAMUSCULAR | Status: DC | PRN
  Administered 2016-07-12: 50 ug via INTRAVENOUS
  Administered 2016-07-12: 150 ug via INTRAVENOUS

## 2016-07-12 MED ORDER — CARVEDILOL 12.5 MG PO TABS
12.5000 mg | ORAL_TABLET | Freq: Two times a day (BID) | ORAL | Status: DC
  Administered 2016-07-12 – 2016-07-13 (×2): 12.5 mg via ORAL
  Filled 2016-07-12 (×2): qty 1

## 2016-07-12 MED ORDER — SODIUM CHLORIDE 0.9 % IV SOLN: 250.0000 mL | INTRAVENOUS | Status: DC | PRN

## 2016-07-12 MED ORDER — INFLUENZA VAC SPLIT QUAD 0.5 ML IM SUSY
0.5000 mL | PREFILLED_SYRINGE | INTRAMUSCULAR | Status: AC
  Administered 2016-07-13: 0.5 mL via INTRAMUSCULAR
  Filled 2016-07-12: qty 0.5

## 2016-07-12 MED ORDER — FUROSEMIDE 20 MG PO TABS
20.0000 mg | ORAL_TABLET | Freq: Every day | ORAL | Status: DC
  Administered 2016-07-13: 20 mg via ORAL
  Filled 2016-07-12 (×2): qty 1

## 2016-07-12 MED ORDER — HYDROMORPHONE HCL 1 MG/ML IJ SOLN: 0.2500 mg | INTRAMUSCULAR | Status: DC | PRN

## 2016-07-12 MED ORDER — HYDROCODONE-ACETAMINOPHEN 5-325 MG PO TABS: 1.0000 | ORAL_TABLET | ORAL | Status: DC | PRN

## 2016-07-12 MED ORDER — ISOPROTERENOL HCL 0.2 MG/ML IJ SOLN
INTRAVENOUS | Status: DC | PRN
  Administered 2016-07-12: 10 ug/min via INTRAVENOUS

## 2016-07-12 MED ORDER — ONDANSETRON HCL 4 MG/2ML IJ SOLN
INTRAMUSCULAR | Status: DC | PRN
  Administered 2016-07-12 (×2): 4 mg via INTRAVENOUS

## 2016-07-12 MED ORDER — PROPOFOL 10 MG/ML IV BOLUS
INTRAVENOUS | Status: DC | PRN
  Administered 2016-07-12: 130 mg via INTRAVENOUS

## 2016-07-12 MED ORDER — NEOSTIGMINE METHYLSULFATE 10 MG/10ML IV SOLN
INTRAVENOUS | Status: DC | PRN
  Administered 2016-07-12: 4 mg via INTRAVENOUS

## 2016-07-12 MED ORDER — ROCURONIUM BROMIDE 100 MG/10ML IV SOLN
INTRAVENOUS | Status: DC | PRN
  Administered 2016-07-12: 50 mg via INTRAVENOUS
  Administered 2016-07-12: 30 mg via INTRAVENOUS

## 2016-07-12 MED ORDER — GLYCOPYRROLATE 0.2 MG/ML IJ SOLN
INTRAMUSCULAR | Status: DC | PRN
  Administered 2016-07-12: 0.6 mg via INTRAVENOUS

## 2016-07-12 MED ORDER — APIXABAN 5 MG PO TABS
5.0000 mg | ORAL_TABLET | Freq: Two times a day (BID) | ORAL | Status: DC
  Administered 2016-07-12 – 2016-07-13 (×2): 5 mg via ORAL
  Filled 2016-07-12 (×3): qty 1

## 2016-07-12 MED ORDER — HEPARIN SODIUM (PORCINE) 1000 UNIT/ML IJ SOLN
INTRAMUSCULAR | Status: AC
  Filled 2016-07-12: qty 1

## 2016-07-12 MED ORDER — INSULIN GLARGINE 100 UNIT/ML ~~LOC~~ SOLN
20.0000 [IU] | Freq: Every day | SUBCUTANEOUS | Status: DC
Start: 2016-07-12 — End: 2016-07-13
  Administered 2016-07-12: 20 [IU] via SUBCUTANEOUS
  Filled 2016-07-12 (×2): qty 0.2

## 2016-07-12 MED ORDER — BUPIVACAINE HCL (PF) 0.25 % IJ SOLN
INTRAMUSCULAR | Status: DC | PRN
  Administered 2016-07-12: 30 mL

## 2016-07-12 MED ORDER — PROTAMINE SULFATE 10 MG/ML IV SOLN
INTRAVENOUS | Status: DC | PRN
  Administered 2016-07-12: 30 mg via INTRAVENOUS

## 2016-07-12 MED ORDER — LISINOPRIL 20 MG PO TABS
20.0000 mg | ORAL_TABLET | ORAL | Status: DC
  Administered 2016-07-13: 20 mg via ORAL
  Filled 2016-07-12: qty 1

## 2016-07-12 MED ORDER — ISOPROTERENOL HCL 0.2 MG/ML IJ SOLN
INTRAMUSCULAR | Status: AC
  Filled 2016-07-12: qty 5

## 2016-07-12 SURGICAL SUPPLY — 17 items
BAG SNAP BAND KOVER 36X36 (MISCELLANEOUS) ×2 IMPLANT
BLANKET WARM UNDERBOD FULL ACC (MISCELLANEOUS) ×2 IMPLANT
CATH NAVISTAR SMARTTOUCH DF (ABLATOR) ×2 IMPLANT
CATH SOUNDSTAR 3D IMAGING (CATHETERS) ×2 IMPLANT
CATH VARIABLE LASSO NAV 2515 (CATHETERS) ×2 IMPLANT
CATH WEBSTER BI DIR CS D-F CRV (CATHETERS) ×2 IMPLANT
NEEDLE TRANSEP BRK 71CM 407200 (NEEDLE) ×2 IMPLANT
PACK EP LATEX FREE (CUSTOM PROCEDURE TRAY) ×1
PACK EP LF (CUSTOM PROCEDURE TRAY) ×1 IMPLANT
PAD DEFIB LIFELINK (PAD) ×2 IMPLANT
PATCH CARTO3 (PAD) ×2 IMPLANT
SHEATH AVANTI 11F 11CM (SHEATH) ×2 IMPLANT
SHEATH PINNACLE 7F 10CM (SHEATH) ×4 IMPLANT
SHEATH PINNACLE 9F 10CM (SHEATH) ×2 IMPLANT
SHEATH SWARTZ TS SL2 63CM 8.5F (SHEATH) ×2 IMPLANT
SHIELD RADPAD SCOOP 12X17 (MISCELLANEOUS) ×2 IMPLANT
TUBING SMART ABLATE COOLFLOW (TUBING) ×2 IMPLANT

## 2016-07-12 NOTE — Interval H&P Note (Signed)
History and Physical Interval Note:  07/12/2016 7:47 AM  Lucas Dudley  has presented today for surgery, with the diagnosis of afib  The various methods of treatment have been discussed with the patient and family. After consideration of risks, benefits and other options for treatment, the patient has consented to  Procedure(s): Atrial Fibrillation Ablation (N/A) as a surgical intervention .  The patient's history has been reviewed, patient examined, no change in status, stable for surgery.  I have reviewed the patient's chart and labs.  Questions were answered to the patient's satisfaction.     Thompson Grayer

## 2016-07-12 NOTE — Progress Notes (Addendum)
Site area: 3 rt fv sheaths Site Prior to Removal:  Level 0 Pressure Applied For:  20  minutes   Manual:   yes Patient Status During Pull:  stable Post Pull Site:  Level  0 Post Pull Instructions Given:  yes Post Pull Pulses Present: yes Dressing Applied:  tegderm Bedrest begins @ [id number] Comments:  IV saline flushed and locked

## 2016-07-12 NOTE — Anesthesia Postprocedure Evaluation (Signed)
Anesthesia Post Note  Patient: Lucas Dudley  Procedure(s) Performed: Procedure(s) (LRB): Atrial Fibrillation Ablation (N/A)  Patient location during evaluation: PACU Anesthesia Type: General Level of consciousness: awake and alert Pain management: pain level controlled Vital Signs Assessment: post-procedure vital signs reviewed and stable Respiratory status: spontaneous breathing, nonlabored ventilation, respiratory function stable and patient connected to nasal cannula oxygen Cardiovascular status: blood pressure returned to baseline and stable Postop Assessment: no signs of nausea or vomiting Anesthetic complications: no    Last Vitals:  Vitals:   07/12/16 1140 07/12/16 1145  BP: 134/75 132/77  Pulse: (!) 36 (!) 59  Resp: 19 15  Temp:      Last Pain:  Vitals:   07/12/16 0558  TempSrc: Oral                 [place] Welcome,JAMES TERRILL

## 2016-07-12 NOTE — Anesthesia Preprocedure Evaluation (Signed)
Anesthesia Evaluation  Patient identified by MRN, date of birth, ID band Patient awake    Reviewed: Allergy & Precautions, NPO status , Patient's Chart, lab work & pertinent test results  History of Anesthesia Complications Negative for: history of anesthetic complications  Airway Mallampati: II  TM Distance: >3 FB Neck ROM: Full    Dental  (+) Teeth Intact, Dental Advisory Given   Pulmonary shortness of breath, asthma , sleep apnea , former smoker,    breath sounds clear to auscultation       Cardiovascular hypertension, + CAD and +CHF  + dysrhythmias Atrial Fibrillation  Rhythm:Irregular Rate:Normal     Neuro/Psych    GI/Hepatic negative GI ROS, Neg liver ROS,   Endo/Other  diabetes, Well Controlled, Type 1Morbid obesity  Renal/GU Renal InsufficiencyRenal disease     Musculoskeletal  (+) Arthritis ,   Abdominal (+) + obese,   Peds  Hematology   Anesthesia Other Findings   Reproductive/Obstetrics                             Anesthesia Physical Anesthesia Plan  ASA: III  Anesthesia Plan: MAC   Post-op Pain Management:    Induction: Intravenous  Airway Management Planned: Simple Face Mask and Natural Airway  Additional Equipment:   Intra-op Plan:   Post-operative Plan:   Informed Consent: I have reviewed the patients History and Physical, chart, labs and discussed the procedure including the risks, benefits and alternatives for the proposed anesthesia with the patient or authorized representative who has indicated his/her understanding and acceptance.   Dental advisory given  Plan Discussed with: CRNA  Anesthesia Plan Comments:         Anesthesia Quick Evaluation

## 2016-07-12 NOTE — Transfer of Care (Signed)
Immediate Anesthesia Transfer of Care Note  Patient: Lucas Dudley  Procedure(s) Performed: Procedure(s): Atrial Fibrillation Ablation (N/A)  Patient Location: PACU  Anesthesia Type:General  Level of Consciousness: awake, alert , oriented and patient cooperative  Airway & Oxygen Therapy: Patient Spontanous Breathing and Patient connected to nasal cannula oxygen  Post-op Assessment: Report given to RN and Post -op Vital signs reviewed and stable  Post vital signs: Reviewed and stable  Last Vitals:  Vitals:   07/12/16 1103 07/12/16 1105  BP:  136/78  Pulse:  63  Resp:  19  Temp: (!) 36 C     Last Pain:  Vitals:   07/12/16 0558  TempSrc: Oral         Complications: No apparent anesthesia complications

## 2016-07-12 NOTE — H&P (View-Only) (Signed)
Electrophysiology Office Note   Date:  07/05/2016   ID:  Lucas Dudley, DOB 1941/02/28, MRN [id number]  PCP:  Lucas Bellow, MD  Cardiologist:  Dr Lucas Dudley Primary Electrophysiologist: Lucas Grayer, MD    Chief Complaint  Patient presents with  . Atrial Fibrillation     History of Present Illness: Lucas Dudley is a 75 y.o. male who presents today for electrophysiology evaluation.  He has returned to afib  He has fatigue and decreased exercise tolerance.  Today, he denies symptoms of palpitations, chest pain,  orthopnea, PND, lower extremity edema, claudication, dizziness, presyncope, syncope, bleeding, or neurologic sequela. The patient is tolerating medications without difficulties and is otherwise without complaint today.    Past Medical History:  Diagnosis Date  . Arthritis    "hands probably" (05/04/2015)  . CAD (coronary artery disease)    a. Cath 03/17/15 showing 100% ostial D1, 50% prox LAD to mid LAD, 40% RPDA stenosis. Med rx.  . Childhood asthma   . Chronic bronchitis ([place])    "had it several times til I quit smoking"  . Chronic constipation   . Chronic lower back pain   . Glaucoma   . History of gout   . Hyperlipidemia   . Hypertension   . Kidney stones    "Dudley during prostate OR; still in there as far as I know" (05/04/2015)  . Melanoma of neck ([place])    "right; came back IV; no chemo"  . NICM (nonischemic cardiomyopathy) ([place])   . OSA on CPAP 2012  . Paroxysmal atrial fibrillation (HCC)    a. 04/2015 Tikosyn loading, DCCV x 1;  b. CHA2DS2VASc = 5-->Eliquis.  . Prostate cancer ([place])   . Type II diabetes mellitus ([place])    Past Surgical History:  Procedure Laterality Date  . ABDOMINAL HERNIA REPAIR     w/mesh  . CARDIAC CATHETERIZATION N/A 03/17/2015   Procedure: Left Heart Cath and Coronary Angiography;  Surgeon: Lucas Harp, MD;  Location: [facility];  Service: Cardiovascular;  Laterality: N/A;  . carotid doppler  09/17/2008   rigt and left  ICAs 0-49%;mildly  abnormal  . DOPPLER ECHOCARDIOGRAPHY  05/25/2009   EF 50-55%,LA mildly dilated, LV function normal  . ELECTROPHYSIOLOGIC STUDY N/A 04/05/2015   Procedure: Cardioversion;  Surgeon: Lucas Klein, MD;  Location: [facility];  Service: Cardiovascular;  Laterality: N/A;  . ELECTROPHYSIOLOGIC STUDY N/A 09/06/2015   Procedure: Atrial Fibrillation Ablation;  Surgeon: Lucas Grayer, MD;  Location: [facility];  Service: Cardiovascular;  Laterality: N/A;  . EXCISIONAL HEMORRHOIDECTOMY     "inside and out"  . FINE NEEDLE ASPIRATION Right    knee; "drew ~ 1 quart off"  . HERNIA REPAIR    . LAPAROSCOPIC CHOLECYSTECTOMY    . MELANOMA EXCISION Right    "neck"  . NM MYOCAR PERF WALL MOTION  02/21/2012   EF 61% ,EXERCISE 7 METS. exercise stopped due to wheezing and shortness of breathe  . PROSTATECTOMY    . SHOULDER OPEN ROTATOR CUFF REPAIR Right X 2  . TEE WITHOUT CARDIOVERSION N/A 09/05/2015   Procedure: TRANSESOPHAGEAL ECHOCARDIOGRAM (TEE);  Surgeon: Lucas Margarita, MD;  Location: [facility] ENDOSCOPY;  Service: Cardiovascular;  Laterality: N/A;     Current Outpatient Prescriptions  Medication Sig Dispense Refill  . apixaban (ELIQUIS) 5 MG TABS tablet Take 5 mg by mouth 2 (two) times daily.    . calcium carbonate (TUMS) 500 MG chewable tablet Chew 1 tablet by  mouth as directed. Use as needed    . carvedilol (COREG) 12.5 MG tablet Take 1 tablet (12.5 mg total) by mouth 2 (two) times daily with a meal. 180 tablet 3  . docusate sodium (COLACE) 100 MG capsule Take 100 mg by mouth daily as needed (constipation).    . furosemide (LASIX) 20 MG tablet Take 1 tablet (20 mg total) by mouth daily. 90 tablet 3  . gemfibrozil (LOPID) 600 MG tablet Take 600 mg by mouth at bedtime.     [name] glimepiride (AMARYL) 2 MG tablet Take 2 mg by mouth 2 (two) times daily.     . Insulin Glargine (LANTUS [place]) Inject 20 Units into the skin at bedtime.    [name] latanoprost (XALATAN) 0.005 % ophthalmic  solution Place 1 drop into both eyes at bedtime.    [name] lisinopril (PRINIVIL,ZESTRIL) 20 MG tablet Take 20 mg by mouth every morning.     . Magnesium Oxide 400 (240 Mg) MG TABS Take 400 mg by mouth daily. 30 tablet   . metFORMIN (GLUCOPHAGE) 500 MG tablet Take 1,000 mg by mouth 2 (two) times daily.     No current facility-administered medications for this visit.     Allergies:   Tape   Social History:  The patient  reports that he quit smoking about 32 years ago. His smoking use included Cigarettes. He has a 54.00 pack-year smoking history. He has never used smokeless tobacco. He reports that he does not drink alcohol or use drugs.   Family History:  The patient's  family history includes Diabetes in his father; Healthy in his daughter; Heart attack (age of onset: 6) in his mother; Heart disease in his father and mother; Lung cancer in his mother; Stroke in his brother.    ROS:  Please see the history of present illness.   All other systems are reviewed and negative.    PHYSICAL EXAM: VS:  BP 122/74   Pulse 86   Ht 5' 10.5" (1.791 m)   Wt 241 lb (109.3 kg)   BMI 34.09 kg/m  , BMI Body mass index is 34.09 kg/m. GEN: Well nourished, well developed, in no acute distress  HEENT: normal  Neck: no JVD, carotid bruits, or masses Cardiac: iRRR; no murmurs, rubs, or gallops,no edema  Respiratory:  clear to auscultation bilaterally, normal work of breathing GI: soft, nontender, nondistended, + BS MS: no deformity or atrophy  Skin: warm and dry  Neuro:  Strength and sensation are intact Psych: euthymic mood, full affect  EKG:  EKG today reveals sinus rhythm 86 bpm,  incomplete RBBB, LAHB  Recent Labs: 11/22/2015: Magnesium 1.7 05/08/2016: ALT 13; B Natriuretic Peptide 336.0; Hemoglobin 13.7; Platelets 183 05/17/2016: BUN 21; Creat 1.23; Potassium 4.8; Sodium 139    Lipid Panel  No results Dudley for: CHOL, TRIG, HDL, CHOLHDL, VLDL, LDLCALC, LDLDIRECT   Wt Readings from Last 3  Encounters:  07/04/16 241 lb (109.3 kg)  05/17/16 237 lb (107.5 kg)  05/08/16 243 lb (110.2 kg)     ASSESSMENT AND PLAN:  1.  Persistent atrial fibrillation He has returned to atrial fibrillation. Therapeutic strategies for afib including medicine and repeat ablation were discussed in detail with the patient today. Risk, benefits, and alternatives to EP study and radiofrequency ablation for afib were also discussed in detail today. These risks include but are not limited to stroke, bleeding, vascular damage, tamponade, perforation, damage to the esophagus, lungs, and other structures, pulmonary vein stenosis, worsening renal function, and death.  The patient understands these risk and wishes to proceed.  We will therefore proceed with catheter ablation at the next available time.  Will obtain cardiac CT prior to ablation.  2. Nonischemic CM/ chronic systolic dysfunction EF previously improved with sinus  3. HTN Stable No change required today  4. CAD No ischemic symptoms  5. OSA Followed by Dr Claiborne Billings  Current medicines are reviewed at length with the patient today.   The patient does not have concerns regarding his medicines.  The following changes were made today:  none    Signed, Lucas Grayer, MD  07/05/2016 4:57 PM     [place] [address] [phone] (office) [phone] (fax)

## 2016-07-12 NOTE — Discharge Summary (Signed)
ELECTROPHYSIOLOGY PROCEDURE DISCHARGE SUMMARY    Patient ID: Lucas Dudley,  MRN: [id number], DOB/AGE: November 16, 1940 75 y.o.  Admit date: 07/12/2016 Discharge date: 07/13/16  Primary Care Physician: Robert Bellow, MD  Primary Cardiologist: Dr. Gwenlyn Found Electrophysiologist: Thompson Grayer, MD  Primary Discharge Diagnosis:  1. Persistent AFib     CHA2DS2Vasc is at least 5, on  Eliquis  Secondary Discharge Diagnosis:  1. CAD 2. CM 3. HTN 4. DM 5. OSA  Procedures This Admission:  1.  Electrophysiology study and radiofrequency catheter ablation on 07/12/16 by Dr Thompson Grayer.  This study demonstrated    CONCLUSIONS: 1. Atrial fibrillation upon presentation.   2.  Intracardiac echo reveals a moderate sized left atrium with four separate pulmonary veins.  The left inferior pulmonary vein was small and had minor ostial narrowing without overt stenosis. 3.  Return of electrical activity within the right superior and right inferior pulmonary veins.  The lefts superior and inferior pulmonary veins were quiescent and remained isolated from the previous ablation procedure.  The right superior pulmonary vein was felt to be the culprit vein. 4. Successful re-isolate the right superior and inferior pulmonary veins using radiofrequency current  ([facility]).   5. Additional ablation was performed along the carina between the right inferior and superior PVs. A standard posterior "box" lesion was also performed with RV 6. No ablation was performed at the left PVs today. 7. Atrial fibrillation successfully cardioverted to sinus rhythm. 8. No early apparent complications.  Brief HPI: Lucas Dudley is a 75 y.o. male with a history of persistent atrial fibrillation.  Risks, benefits, and alternatives to catheter ablation of atrial fibrillation were reviewed with the patient who wished to proceed.  The patient underwent cardiac CT prior to the procedure which no LAA thrombus.    Hospital Course:  The patient  was admitted and underwent EPS/RFCA of atrial fibrillation with details as outlined above.  They were monitored on telemetry overnight which demonstrated SR.  Groin was without complication on the day of discharge.  The patient was examined by Dr. Rayann Heman and considered to be stable for discharge.  Wound care and restrictions were reviewed with the patient.  The patient will be seen back by Roderic Palau, NP in 4 weeks and Dr Rayann Heman in 12 weeks for post ablation follow up.   He has been instructed to increase his lasix to BID for 4 days given IVF with his procedure and recent AF with some evidence of mild fluid OL, then resume daily dosing, will give him protonix for 6 weeks.    Physical Exam: Vitals:   07/13/16 0700 07/13/16 0728 07/13/16 0754 07/13/16 0800  BP:   (!) 161/91 (!) 161/97  Pulse: (!) 58 60 (!) 59 (!) 59  Resp:      Temp:   98.1 F (36.7 C)   TempSrc:   Oral   SpO2: 95%  97% 96%  Weight:      Height:         GEN- The patient is well appearing, alert and oriented x 3 today.   HEENT: normocephalic, atraumatic; sclera clear, conjunctiva pink; hearing intact; oropharynx clear; neck supple  Lungs- Clear to ausculation bilaterally, normal work of breathing. Slightly diminished at the bases No wheezes, rales, rhonchi Heart- RRR, no murmurs, rubs or gallops  GI- soft, non-tender, non-distended Extremities- no clubbing, cyanosis, trace edema, R groin without hematoma/bruit MS- no significant deformity or atrophy Skin- warm and dry, no rash or lesion Psych-  euthymic mood, full affect Neuro- strength and sensation are intact   Labs:   Lab Results  Component Value Date   WBC 6.1 07/06/2016   HGB 14.2 07/06/2016   HCT 41.9 07/06/2016   MCV 87.1 07/06/2016   PLT 226 07/06/2016     Recent Labs Lab 07/06/16 1326  NA 139  K 4.4  CL 100  CO2 27  BUN 21  CREATININE 1.03  CALCIUM 9.2  GLUCOSE 215*     Discharge Medications:    Medication List    TAKE these  medications   carvedilol 12.5 MG tablet Commonly known as:  COREG Take 1 tablet (12.5 mg total) by mouth 2 (two) times daily with a meal.   ELIQUIS 5 MG Tabs tablet Generic drug:  apixaban Take 5 mg by mouth 2 (two) times daily.   furosemide 20 MG tablet Commonly known as:  LASIX Take 2 tablets (40 mg total) by mouth daily. Notes to patient:  Take twice daily for 4 days then resume daily dosing   gemfibrozil 600 MG tablet Commonly known as:  LOPID Take 600 mg by mouth at bedtime.   glimepiride 2 MG tablet Commonly known as:  AMARYL Take 2 mg by mouth 2 (two) times daily.   LANTUS [place] Inject 20 Units into the skin at bedtime.   latanoprost 0.005 % ophthalmic solution Commonly known as:  XALATAN Place 1 drop into both eyes at bedtime.   lisinopril 20 MG tablet Commonly known as:  PRINIVIL,ZESTRIL Take 20 mg by mouth every morning.   Magnesium Oxide 400 (240 Mg) MG Tabs Take 400 mg by mouth daily.   metFORMIN 500 MG tablet Commonly known as:  GLUCOPHAGE Take 1,000 mg by mouth 2 (two) times daily.   pantoprazole 40 MG tablet Commonly known as:  PROTONIX Take 1 tablet (40 mg total) by mouth daily. For 45 days       Disposition: HOME  Discharge Instructions    Diet - low sodium heart healthy    Complete by:  As directed    Increase activity slowly    Complete by:  As directed      Follow-up Information    [facility] Follow up on 08/10/2016.   Specialty:  Cardiology Why:  11:00AM Contact information: [address] [id number] [address] [phone]       Thompson Grayer, MD Follow up on 10/10/2016.   Specialty:  Cardiology Why:  1:45PM Contact information: [place] [address] [phone]           Duration of Discharge Encounter: Greater than 30 minutes including physician time.  Signed, Tommye Standard, PA-C 07/13/2016 8:32 AM   I have seen, examined the  patient, and reviewed the above assessment and plan.  Changes to above are made where necessary.   On exam, RRR.  Co Sign: Thompson Grayer, MD 07/13/2016 1:56 PM

## 2016-07-12 NOTE — Progress Notes (Signed)
RT NOTE:  Pt has home CPAP setup @ bedside. Pt will manage CPAP. RT will monitor.

## 2016-07-12 NOTE — Discharge Instructions (Signed)
No driving for 1 week. No lifting over 5 lbs for 1 week. No vigorous or sexual activity for 1 week. You may return to work on 07/19/16. Keep procedure site clean & dry. If you notice increased pain, swelling, bleeding or pus, call/return!  You may shower, but no soaking baths/hot tubs/pools for 1 week.      You have an appointment set up with the [facility].  Multiple studies have shown that being followed by a dedicated atrial fibrillation clinic in addition to the standard care you receive from your other physicians improves health. We believe that enrollment in the atrial fibrillation clinic will allow [place] to better care for you.   The phone number to the [facility] is [phone]. The clinic is staffed Monday through Friday from 8:30am to 5pm.  Parking Directions: The clinic is located in the Heart and Vascular Building connected to [facility]. 1)From [address] turn on to [address] and go to the 3rd entrance  (Heart and Vascular entrance) on the right. 2)Look to the right for Heart &Vascular Parking Garage. 3)A code for the entrance is required please call the clinic to receive this.   4)Take the elevators to the 1st floor. Registration is in the room with the glass walls at the end of the hallway.  If you have any trouble parking or locating the clinic, please dont hesitate to call [phone].

## 2016-07-13 ENCOUNTER — Encounter (HOSPITAL_COMMUNITY): Payer: Self-pay | Admitting: Internal Medicine

## 2016-07-13 ENCOUNTER — Telehealth: Payer: Self-pay | Admitting: Internal Medicine

## 2016-07-13 DIAGNOSIS — I481 Persistent atrial fibrillation: Secondary | ICD-10-CM | POA: Diagnosis not present

## 2016-07-13 DIAGNOSIS — I10 Essential (primary) hypertension: Secondary | ICD-10-CM | POA: Diagnosis not present

## 2016-07-13 DIAGNOSIS — I2510 Atherosclerotic heart disease of native coronary artery without angina pectoris: Secondary | ICD-10-CM | POA: Diagnosis not present

## 2016-07-13 DIAGNOSIS — G4733 Obstructive sleep apnea (adult) (pediatric): Secondary | ICD-10-CM | POA: Diagnosis not present

## 2016-07-13 LAB — GLUCOSE, CAPILLARY: Glucose-Capillary: 166 mg/dL — ABNORMAL HIGH (ref 65–99)

## 2016-07-13 MED ORDER — FUROSEMIDE 20 MG PO TABS: 40.0000 mg | ORAL_TABLET | Freq: Every day | ORAL | 3 refills | Status: DC

## 2016-07-13 MED ORDER — FUROSEMIDE 10 MG/ML IJ SOLN
INTRAMUSCULAR | Status: AC
  Filled 2016-07-13: qty 2

## 2016-07-13 MED ORDER — PANTOPRAZOLE SODIUM 40 MG PO TBEC: 40.0000 mg | DELAYED_RELEASE_TABLET | Freq: Every day | ORAL | 0 refills | Status: DC

## 2016-07-13 MED ORDER — FUROSEMIDE 10 MG/ML IJ SOLN
20.0000 mg | Freq: Once | INTRAMUSCULAR | Status: AC
  Administered 2016-07-13: 20 mg via INTRAVENOUS

## 2016-07-13 MED FILL — Heparin Sodium (Porcine) 2 Unit/ML in Sodium Chloride 0.9%: INTRAMUSCULAR | Qty: 1000 | Status: AC

## 2016-07-13 NOTE — Progress Notes (Signed)
Patient woke up this am complained of short of breath lungs diminished bilaterally and clear with occasional cough, O2 saturation 90-91% on RA. Patient requested diuretics po and wants to sit at the side of the bed to relieve from short of breath.    Dr. Marlowe Sax was notified of patients symptoms with orders made. Oxygen administered at 2l [place], Lasix 20mg . IV was given. Patient lay back to bed and also was given tylenol for headache. Will continue to monitor respiratory status and pain.

## 2016-07-13 NOTE — Telephone Encounter (Signed)
Patient stated that he had taken an Arthritis Strength Tylenol 650mg  - one tab earlier.  Pain has gone down slightly, now rating 5/10.  Does also c/o pain with taking a deep breath as well.    Discussed with Dr. Rayann Heman -  Informed patient that he can take Ibuprofen 400mg  every 8 hours over the weekend.  Pain should gradually get better day, by day.    Advised to call office back if not feeling better by Monday.  Patient verbalized understanding.

## 2016-07-13 NOTE — Telephone Encounter (Signed)
°  Lucas Dudley   - DOB   01/18/1941   [name] (daughter) Has called [facility] in regards to her father.  .  States father had ablation yesterday at [facility] now having CP   1-10 pain  6 being pain. Can you ask Dr. Rayann Heman  - discharged at 945am .

## 2016-07-19 ENCOUNTER — Telehealth (HOSPITAL_COMMUNITY): Payer: Self-pay | Admitting: *Deleted

## 2016-07-19 NOTE — Telephone Encounter (Addendum)
0900--Patient called in stating this morning his HR was around 101-105 and just not feeling well. Pt had afib ablation last week. Pt had not taken morning medications when he called. Told pt to call back this afternoon to report how HR is. 1500--pt states HR is now in the 80s still feels very tired. Reports no shortness of breath and is down 3lbs since being home. Does not feel he is fluid overloaded. Encouraged pt he just had ablation last week can be slow to recover energy. Pt to keep log of his BP/HR and call if HR continue to be elevated each day and will bring in sooner than 4 week appointment. Pt verbalized understanding.

## 2016-08-07 ENCOUNTER — Ambulatory Visit (HOSPITAL_COMMUNITY)
Admission: RE | Admit: 2016-08-07 | Discharge: 2016-08-07 | Disposition: A | Discharge status: Home / Self Care | Payer: Medicare Other | Source: Ambulatory Visit | Attending: Nurse Practitioner | Admitting: Nurse Practitioner

## 2016-08-07 ENCOUNTER — Encounter (HOSPITAL_COMMUNITY): Payer: Self-pay | Admitting: Nurse Practitioner

## 2016-08-07 VITALS — BP 150/80 | HR 52 | Ht 70.0 in | Wt 238.6 lb

## 2016-08-07 DIAGNOSIS — Z7901 Long term (current) use of anticoagulants: Secondary | ICD-10-CM | POA: Insufficient documentation

## 2016-08-07 DIAGNOSIS — Z87891 Personal history of nicotine dependence: Secondary | ICD-10-CM | POA: Insufficient documentation

## 2016-08-07 DIAGNOSIS — Z794 Long term (current) use of insulin: Secondary | ICD-10-CM | POA: Diagnosis not present

## 2016-08-07 DIAGNOSIS — Z79899 Other long term (current) drug therapy: Secondary | ICD-10-CM | POA: Insufficient documentation

## 2016-08-07 DIAGNOSIS — I4819 Other persistent atrial fibrillation: Secondary | ICD-10-CM

## 2016-08-07 DIAGNOSIS — E119 Type 2 diabetes mellitus without complications: Secondary | ICD-10-CM | POA: Insufficient documentation

## 2016-08-07 DIAGNOSIS — I481 Persistent atrial fibrillation: Secondary | ICD-10-CM | POA: Diagnosis present

## 2016-08-07 DIAGNOSIS — I2510 Atherosclerotic heart disease of native coronary artery without angina pectoris: Secondary | ICD-10-CM | POA: Insufficient documentation

## 2016-08-07 DIAGNOSIS — G4733 Obstructive sleep apnea (adult) (pediatric): Secondary | ICD-10-CM | POA: Diagnosis not present

## 2016-08-07 DIAGNOSIS — I10 Essential (primary) hypertension: Secondary | ICD-10-CM | POA: Insufficient documentation

## 2016-08-07 NOTE — Progress Notes (Signed)
Patient ID: Lucas Dudley, male   DOB: 1940/11/05, 75 y.o.   MRN: [id number]      Primary Care Physician: Robert Bellow, MD Referring Physician: Dr. Genevie Ann is a 75 y.o. male with a h/o persistent afib, CAD, OSA, HTN, s/p ablation and abaltion 11/16, He had recurrent afib and one month ago had a second ablation. H e reports that he has not had any irregular heartbeat that he can tell. No swallowing or groin issues. He has had a runny nose and sinus running down the back of his throat x 2-3 days. He is requesting levaquin. No fever or chills, clear drainage.  Today, he denies symptoms of palpitations, chest pain, shortness of breath, orthopnea, PND, lower extremity edema, dizziness, presyncope, syncope, or neurologic sequela. Positive for clear nasal drainage.The patient is tolerating medications without difficulties and is otherwise without complaint today.   Past Medical History:  Diagnosis Date  . Arthritis    "hands probably" (05/04/2015)  . CAD (coronary artery disease)    a. Cath 03/17/15 showing 100% ostial D1, 50% prox LAD to mid LAD, 40% RPDA stenosis. Med rx.  . Childhood asthma   . Chronic bronchitis ([place])    "had it several times til I quit smoking"  . Chronic constipation   . Chronic lower back pain   . Glaucoma   . History of gout   . Hyperlipidemia   . Hypertension   . Kidney stones    "found during prostate OR; still in there as far as I know" (05/04/2015)  . Melanoma of neck ([place])    "right; came back IV; no chemo"  . NICM (nonischemic cardiomyopathy) ([place])   . OSA on CPAP 2012  . Paroxysmal atrial fibrillation (HCC)    a. 04/2015 Tikosyn loading, DCCV x 1;  b. CHA2DS2VASc = 5-->Eliquis.  . Prostate cancer ([place])   . Type II diabetes mellitus ([place])    Past Surgical History:  Procedure Laterality Date  . ABDOMINAL HERNIA REPAIR     w/mesh  . CARDIAC CATHETERIZATION N/A 03/17/2015   Procedure: Left Heart Cath and Coronary Angiography;  Surgeon:  Lorretta Harp, MD;  Location: [place];  Service: Cardiovascular;  Laterality: N/A;  . carotid doppler  09/17/2008   rigt and left ICAs 0-49%;mildly  abnormal  . DOPPLER ECHOCARDIOGRAPHY  05/25/2009   EF 50-55%,LA mildly dilated, LV function normal  . ELECTROPHYSIOLOGIC STUDY N/A 04/05/2015   Procedure: Cardioversion;  Surgeon: Sanda Klein, MD;  Location: [facility];  Service: Cardiovascular;  Laterality: N/A;  . ELECTROPHYSIOLOGIC STUDY N/A 09/06/2015   Procedure: Atrial Fibrillation Ablation;  Surgeon: Thompson Grayer, MD;  Location: [facility];  Service: Cardiovascular;  Laterality: N/A;  . ELECTROPHYSIOLOGIC STUDY N/A 07/12/2016   Procedure: Atrial Fibrillation Ablation;  Surgeon: Thompson Grayer, MD;  Location: [facility];  Service: Cardiovascular;  Laterality: N/A;  . EXCISIONAL HEMORRHOIDECTOMY     "inside and out"  . FINE NEEDLE ASPIRATION Right    knee; "drew ~ 1 quart off"  . HERNIA REPAIR    . LAPAROSCOPIC CHOLECYSTECTOMY    . MELANOMA EXCISION Right    "neck"  . NM MYOCAR PERF WALL MOTION  02/21/2012   EF 61% ,EXERCISE 7 METS. exercise stopped due to wheezing and shortness of breathe  . PROSTATECTOMY    . SHOULDER OPEN ROTATOR CUFF REPAIR Right X 2  . TEE WITHOUT CARDIOVERSION N/A 09/05/2015   Procedure: TRANSESOPHAGEAL ECHOCARDIOGRAM (  TEE);  Surgeon: Sueanne Margarita, MD;  Location: [facility] ENDOSCOPY;  Service: Cardiovascular;  Laterality: N/A;    Current Outpatient Prescriptions  Medication Sig Dispense Refill  . apixaban (ELIQUIS) 5 MG TABS tablet Take 5 mg by mouth 2 (two) times daily.    . carvedilol (COREG) 12.5 MG tablet Take 1 tablet (12.5 mg total) by mouth 2 (two) times daily with a meal. 180 tablet 3  . furosemide (LASIX) 20 MG tablet Take 2 tablets (40 mg total) by mouth daily. 60 tablet 3  . gemfibrozil (LOPID) 600 MG tablet Take 600 mg by mouth at bedtime.     [name] glimepiride (AMARYL) 2 MG tablet Take 2 mg by mouth 2 (two) times daily.      . Insulin Glargine (LANTUS [place]) Inject 26 Units into the skin at bedtime.     [name] latanoprost (XALATAN) 0.005 % ophthalmic solution Place 1 drop into both eyes at bedtime.    [name] lisinopril (PRINIVIL,ZESTRIL) 20 MG tablet Take 20 mg by mouth every morning.     . Magnesium Oxide 400 (240 Mg) MG TABS Take 400 mg by mouth daily. 30 tablet   . metFORMIN (GLUCOPHAGE) 500 MG tablet Take 1,000 mg by mouth 2 (two) times daily.     No current facility-administered medications for this encounter.     Allergies  Allergen Reactions  . Tape Rash and Other (See Comments)    Causes skin redness, Use paper tape only.    Social History   Social History  . Marital status: Married    Spouse name: N/A  . Number of children: N/A  . Years of education: N/A   Occupational History  . Retired    Social History Main Topics  . Smoking status: Former Smoker    Packs/day: 2.00    Years: 27.00    Types: Cigarettes    Quit date: 10/31/1983  . Smokeless tobacco: Never Used  . Alcohol use No     Comment: "used to drink; stopped ~ 2008"  . Drug use: No  . Sexual activity: Not Currently   Other Topics Concern  . Not on file   Social History Narrative   Lives in [place], [place] with wife.    Family History  Problem Relation Age of Onset  . Heart disease Mother   . Lung cancer Mother   . Heart attack Mother 47  . Stroke Brother   . Healthy Daughter   . Diabetes Father   . Heart disease Father     ROS- All systems are reviewed and negative except as per the HPI above  Physical Exam: Vitals:   08/07/16 1415  BP: (!) 150/80  Pulse: (!) 52  Weight: 238 lb 9.6 oz (108.2 kg)  Height: 5\' 10"  (1.778 m)    GEN- The patient is well appearing, alert and oriented x 3 today.   Head- normocephalic, atraumatic Eyes-  Sclera clear, conjunctiva pink Ears- hearing intact Oropharynx- clear Neck- supple, no JVP Lymph- no cervical lymphadenopathy Lungs- Clear to ausculation bilaterally, normal work of  breathing Heart- Regular rate and rhythm, no murmurs, rubs or gallops, PMI not laterally displaced GI- soft, NT, ND, + BS Extremities- no clubbing, cyanosis, or edema MS- no significant deformity or atrophy Skin- no rash or lesion Psych- euthymic mood, full affect Neuro- strength and sensation are intact  EKG- sinus brady at 52 bpm, pr int 226 ms, qrs int 104 ms, qtc 427 ms Epic records reviewed  Assessment and Plan: 1. Persistent  afib  Ablation x 2 with last ablation 07/12/16 Failed tikosyn in the past Continue carvedilol 12.5 mg bid Continue eliquis 5 mg bid  2. Clear sinus drainage x 2-3 days Requesting Levaquin but does not seem to have infection Recommend antihistamine OTC If symptoms worsen, see pcp  F/u with Dr. Rayann Heman 12/27 as scheduled afib clinic as needed  [place]. Anthonella Klausner, [facility] [address] [place], [place] [address] [phone]

## 2016-08-10 ENCOUNTER — Inpatient Hospital Stay (HOSPITAL_COMMUNITY): Admit: 2016-08-10 | Payer: Medicare Other | Admitting: Nurse Practitioner

## 2016-08-29 ENCOUNTER — Encounter (INDEPENDENT_AMBULATORY_CARE_PROVIDER_SITE_OTHER): Payer: Self-pay | Admitting: *Deleted

## 2016-10-10 ENCOUNTER — Encounter: Payer: Self-pay | Admitting: Internal Medicine

## 2016-10-10 ENCOUNTER — Ambulatory Visit (INDEPENDENT_AMBULATORY_CARE_PROVIDER_SITE_OTHER): Payer: Medicare Other | Admitting: Internal Medicine

## 2016-10-10 VITALS — BP 142/62 | HR 56 | Ht 70.0 in | Wt 240.0 lb

## 2016-10-10 DIAGNOSIS — I480 Paroxysmal atrial fibrillation: Secondary | ICD-10-CM

## 2016-10-10 DIAGNOSIS — I2510 Atherosclerotic heart disease of native coronary artery without angina pectoris: Secondary | ICD-10-CM

## 2016-10-10 DIAGNOSIS — I429 Cardiomyopathy, unspecified: Secondary | ICD-10-CM | POA: Diagnosis not present

## 2016-10-10 DIAGNOSIS — I420 Dilated cardiomyopathy: Secondary | ICD-10-CM

## 2016-10-10 MED ORDER — CARVEDILOL 12.5 MG PO TABS: 6.2500 mg | ORAL_TABLET | Freq: Two times a day (BID) | ORAL | 3 refills | Status: DC

## 2016-10-10 NOTE — Patient Instructions (Signed)
Medication Instructions:  Your physician has recommended you make the following change in your medication:  --1. DECREASE Carvedilol - Take 0.5 tablet (6.25 mg) by mouth twice daily     Labwork: None Ordered  Testing/Procedures: None Ordered  Follow-Up: Your physician recommends that you schedule a follow-up appointment in: 3 MONTHS with Roderic Palau, NP  Your physician wants you to follow-up in 6 MONTHS with Dr. Rayann Heman. You will receive a reminder letter in the mail two months in advance. If you don't receive a letter, please call our office to schedule the follow-up appointment.   Any Other Special Instructions Will Be Listed Below (If Applicable).     If you need a refill on your cardiac medications before your next appointment, please call your pharmacy.

## 2016-10-10 NOTE — Progress Notes (Signed)
Electrophysiology Office Note   Date:  10/10/2016   ID:  Lucas Dudley, DOB April 02, 1941, MRN [id number]  PCP:  Robert Bellow, MD  Cardiologist:  Dr Gwenlyn Found Primary Electrophysiologist: Thompson Grayer, MD    Chief Complaint  Patient presents with  . Paroxysmal atrial fibrillation (HCC)     History of Present Illness: Lucas Dudley is a 75 y.o. male who presents today for electrophysiology evaluation.   He is doing well s/p afib ablation.  Maintaining sinus rhythm.  Denies procedure related complications.  Today, he denies symptoms of palpitations, chest pain,  orthopnea, PND, lower extremity edema, claudication, dizziness, presyncope, syncope, bleeding, or neurologic sequela. The patient is tolerating medications without difficulties and is otherwise without complaint today.    Past Medical History:  Diagnosis Date  . Arthritis    "hands probably" (05/04/2015)  . CAD (coronary artery disease)    a. Cath 03/17/15 showing 100% ostial D1, 50% prox LAD to mid LAD, 40% RPDA stenosis. Med rx.  . Childhood asthma   . Chronic bronchitis ([place])    "had it several times til I quit smoking"  . Chronic constipation   . Chronic lower back pain   . Glaucoma   . History of gout   . Hyperlipidemia   . Hypertension   . Kidney stones    "found during prostate OR; still in there as far as I know" (05/04/2015)  . Melanoma of neck ([place])    "right; came back IV; no chemo"  . NICM (nonischemic cardiomyopathy) ([place])   . OSA on CPAP 2012  . Paroxysmal atrial fibrillation (HCC)    a. 04/2015 Tikosyn loading, DCCV x 1;  b. CHA2DS2VASc = 5-->Eliquis.  . Prostate cancer ([place])   . Type II diabetes mellitus ([place])    Past Surgical History:  Procedure Laterality Date  . ABDOMINAL HERNIA REPAIR     w/mesh  . CARDIAC CATHETERIZATION N/A 03/17/2015   Procedure: Left Heart Cath and Coronary Angiography;  Surgeon: Lorretta Harp, MD;  Location: [facility];  Service: Cardiovascular;  Laterality: N/A;    . carotid doppler  09/17/2008   rigt and left ICAs 0-49%;mildly  abnormal  . DOPPLER ECHOCARDIOGRAPHY  05/25/2009   EF 50-55%,LA mildly dilated, LV function normal  . ELECTROPHYSIOLOGIC STUDY N/A 04/05/2015   Procedure: Cardioversion;  Surgeon: Sanda Klein, MD;  Location: [facility];  Service: Cardiovascular;  Laterality: N/A;  . ELECTROPHYSIOLOGIC STUDY N/A 09/06/2015   Procedure: Atrial Fibrillation Ablation;  Surgeon: Thompson Grayer, MD;  Location: [facility];  Service: Cardiovascular;  Laterality: N/A;  . ELECTROPHYSIOLOGIC STUDY N/A 07/12/2016   redo afib ablation by Dr Rayann Heman  . EXCISIONAL HEMORRHOIDECTOMY     "inside and out"  . FINE NEEDLE ASPIRATION Right    knee; "drew ~ 1 quart off"  . HERNIA REPAIR    . LAPAROSCOPIC CHOLECYSTECTOMY    . MELANOMA EXCISION Right    "neck"  . NM MYOCAR PERF WALL MOTION  02/21/2012   EF 61% ,EXERCISE 7 METS. exercise stopped due to wheezing and shortness of breathe  . PROSTATECTOMY    . SHOULDER OPEN ROTATOR CUFF REPAIR Right X 2  . TEE WITHOUT CARDIOVERSION N/A 09/05/2015   Procedure: TRANSESOPHAGEAL ECHOCARDIOGRAM (TEE);  Surgeon: Sueanne Margarita, MD;  Location: [facility]OS[facility];  Service: Cardiovascular;  Laterality: N/A;     Current Outpatient Prescriptions  Medication Sig Dispense Refill  . apixaban (ELIQUIS) 5 MG TABS tablet Take 5 mg  by mouth 2 (two) times daily.    . carvedilol (COREG) 12.5 MG tablet Take 1 tablet (12.5 mg total) by mouth 2 (two) times daily with a meal. 180 tablet 3  . furosemide (LASIX) 20 MG tablet Take 2 tablets (40 mg total) by mouth daily. 60 tablet 3  . gemfibrozil (LOPID) 600 MG tablet Take 600 mg by mouth at bedtime.     [name] glimepiride (AMARYL) 2 MG tablet Take 2 mg by mouth 2 (two) times daily.     . Insulin Glargine (LANTUS [place]) Inject 26 Units into the skin at bedtime.     [name] latanoprost (XALATAN) 0.005 % ophthalmic solution Place 1 drop into both eyes at bedtime.    [name] lisinopril  (PRINIVIL,ZESTRIL) 20 MG tablet Take 20 mg by mouth every morning.     . Magnesium Oxide 400 (240 Mg) MG TABS Take 400 mg by mouth daily. 30 tablet   . metFORMIN (GLUCOPHAGE) 500 MG tablet Take 1,000 mg by mouth 2 (two) times daily.     No current facility-administered medications for this visit.     Allergies:   Tape   Social History:  The patient  reports that he quit smoking about 32 years ago. His smoking use included Cigarettes. He has a 54.00 pack-year smoking history. He has never used smokeless tobacco. He reports that he does not drink alcohol or use drugs.   Family History:  The patient's  family history includes Diabetes in his father; Healthy in his daughter; Heart attack (age of onset: 26) in his mother; Heart disease in his father and mother; Lung cancer in his mother; Stroke in his brother.    ROS:  Please see the history of present illness.   All other systems are reviewed and negative.    PHYSICAL EXAM: VS:  BP (!) 142/62   Pulse (!) 56   Ht 5\' 10"  (1.778 m)   Wt 240 lb (108.9 kg)   SpO2 97%   BMI 34.44 kg/m  , BMI Body mass index is 34.44 kg/m. GEN: Well nourished, well developed, in no acute distress  HEENT: normal  Neck: no JVD, carotid bruits, or masses Cardiac: RRR; no murmurs, rubs, or gallops,no edema  Respiratory:  clear to auscultation bilaterally, normal work of breathing GI: soft, nontender, nondistended, + BS MS: no deformity or atrophy  Skin: warm and dry  Neuro:  Strength and sensation are intact Psych: euthymic mood, full affect  EKG:  EKG today reveals sinus rhythm 55 bpm, PR 222 msec, LAHB, iRBBB  Recent Labs: 11/22/2015: Magnesium 1.7 05/08/2016: ALT 13; B Natriuretic Peptide 336.0 07/06/2016: BUN 21; Creat 1.03; Hemoglobin 14.2; Platelets 226; Potassium 4.4; Sodium 139    Lipid Panel  No results found for: CHOL, TRIG, HDL, CHOLHDL, VLDL, LDLCALC, LDLDIRECT   Wt Readings from Last 3 Encounters:  10/10/16 240 lb (108.9 kg)  08/07/16 238  lb 9.6 oz (108.2 kg)  07/12/16 243 lb (110.2 kg)     ASSESSMENT AND PLAN:  1.  Persistent atrial fibrillation Maintaining sinus rhythm post ablation off AAD therapy Continue long term anticoagulation Reduce coreg to 6.25mg  BID today  2. Nonischemic CM/ chronic systolic dysfunction EF previously improved with sinus  3. HTN Stable No change required today  4. CAD No ischemic symptoms  5. OSA Followed by Dr Claiborne Billings  Follow-up with Butch Penny in AF clinic in 3 months I will see in 6 months Follow-up with Dr Gwenlyn Found as scheduled.    Signed, Thompson Grayer, MD  10/10/2016 2:12 PM     [place] [place] [address] [phone] (office) [phone] (fax)

## 2016-12-12 ENCOUNTER — Other Ambulatory Visit (HOSPITAL_COMMUNITY): Payer: Self-pay | Admitting: Nurse Practitioner

## 2017-01-09 ENCOUNTER — Ambulatory Visit (HOSPITAL_COMMUNITY)
Admission: RE | Admit: 2017-01-09 | Discharge: 2017-01-09 | Disposition: A | Discharge status: Home / Self Care | Payer: Medicare Other | Source: Ambulatory Visit | Attending: Nurse Practitioner | Admitting: Nurse Practitioner

## 2017-01-09 ENCOUNTER — Encounter (HOSPITAL_COMMUNITY): Payer: Self-pay | Admitting: Nurse Practitioner

## 2017-01-09 VITALS — BP 140/82 | HR 60 | Ht 70.0 in | Wt 241.0 lb

## 2017-01-09 DIAGNOSIS — Z801 Family history of malignant neoplasm of trachea, bronchus and lung: Secondary | ICD-10-CM | POA: Insufficient documentation

## 2017-01-09 DIAGNOSIS — I2510 Atherosclerotic heart disease of native coronary artery without angina pectoris: Secondary | ICD-10-CM | POA: Diagnosis not present

## 2017-01-09 DIAGNOSIS — I480 Paroxysmal atrial fibrillation: Secondary | ICD-10-CM | POA: Diagnosis not present

## 2017-01-09 DIAGNOSIS — G4733 Obstructive sleep apnea (adult) (pediatric): Secondary | ICD-10-CM | POA: Insufficient documentation

## 2017-01-09 DIAGNOSIS — Z8546 Personal history of malignant neoplasm of prostate: Secondary | ICD-10-CM | POA: Diagnosis not present

## 2017-01-09 DIAGNOSIS — E785 Hyperlipidemia, unspecified: Secondary | ICD-10-CM | POA: Insufficient documentation

## 2017-01-09 DIAGNOSIS — Z823 Family history of stroke: Secondary | ICD-10-CM | POA: Insufficient documentation

## 2017-01-09 DIAGNOSIS — Z794 Long term (current) use of insulin: Secondary | ICD-10-CM | POA: Insufficient documentation

## 2017-01-09 DIAGNOSIS — Z7901 Long term (current) use of anticoagulants: Secondary | ICD-10-CM | POA: Insufficient documentation

## 2017-01-09 DIAGNOSIS — Z79899 Other long term (current) drug therapy: Secondary | ICD-10-CM | POA: Insufficient documentation

## 2017-01-09 DIAGNOSIS — I481 Persistent atrial fibrillation: Secondary | ICD-10-CM | POA: Insufficient documentation

## 2017-01-09 DIAGNOSIS — Z833 Family history of diabetes mellitus: Secondary | ICD-10-CM | POA: Diagnosis not present

## 2017-01-09 DIAGNOSIS — I10 Essential (primary) hypertension: Secondary | ICD-10-CM | POA: Diagnosis not present

## 2017-01-09 DIAGNOSIS — E119 Type 2 diabetes mellitus without complications: Secondary | ICD-10-CM | POA: Diagnosis not present

## 2017-01-09 DIAGNOSIS — Z8249 Family history of ischemic heart disease and other diseases of the circulatory system: Secondary | ICD-10-CM | POA: Diagnosis not present

## 2017-01-09 DIAGNOSIS — I429 Cardiomyopathy, unspecified: Secondary | ICD-10-CM | POA: Diagnosis not present

## 2017-01-09 DIAGNOSIS — Z85820 Personal history of malignant melanoma of skin: Secondary | ICD-10-CM | POA: Insufficient documentation

## 2017-01-09 DIAGNOSIS — Z87891 Personal history of nicotine dependence: Secondary | ICD-10-CM | POA: Diagnosis not present

## 2017-01-09 NOTE — Progress Notes (Signed)
Patient ID: Lucas Dudley, male   DOB: 1941/02/20, 76 y.o.   MRN: [id number]      Primary Care Physician: Robert Bellow, MD Referring Physician: Dr. Genevie Ann is a 76 y.o. male with a h/o persistent afib, CAD, OSA, HTN, s/p ablation with  recurrent afib, failing tikosyn and had a repeat ablation 07/12/16. He reports that he has had very little afib since the second ablation. He feels well.  Today, he denies symptoms of palpitations, chest pain, shortness of breath, orthopnea, PND, lower extremity edema, dizziness, presyncope, syncope, or neurologic sequela. Positive for clear nasal drainage.The patient is tolerating medications without difficulties and is otherwise without complaint today.   Past Medical History:  Diagnosis Date  . Arthritis    "hands probably" (05/04/2015)  . CAD (coronary artery disease)    a. Cath 03/17/15 showing 100% ostial D1, 50% prox LAD to mid LAD, 40% RPDA stenosis. Med rx.  . Childhood asthma   . Chronic bronchitis ([place])    "had it several times til I quit smoking"  . Chronic constipation   . Chronic lower back pain   . Glaucoma   . History of gout   . Hyperlipidemia   . Hypertension   . Kidney stones    "found during prostate OR; still in there as far as I know" (05/04/2015)  . Melanoma of neck ([place])    "right; came back IV; no chemo"  . NICM (nonischemic cardiomyopathy) ([place])   . OSA on CPAP 2012  . Paroxysmal atrial fibrillation (HCC)    a. 04/2015 Tikosyn loading, DCCV x 1;  b. CHA2DS2VASc = 5-->Eliquis.  . Prostate cancer ([place])   . Type II diabetes mellitus ([place])    Past Surgical History:  Procedure Laterality Date  . ABDOMINAL HERNIA REPAIR     w/mesh  . CARDIAC CATHETERIZATION N/A 03/17/2015   Procedure: Left Heart Cath and Coronary Angiography;  Surgeon: Lorretta Harp, MD;  Location: [facility];  Service: Cardiovascular;  Laterality: N/A;  . carotid doppler  09/17/2008   rigt and left ICAs 0-49%;mildly  abnormal  .  DOPPLER ECHOCARDIOGRAPHY  05/25/2009   EF 50-55%,LA mildly dilated, LV function normal  . ELECTROPHYSIOLOGIC STUDY N/A 04/05/2015   Procedure: Cardioversion;  Surgeon: Sanda Klein, MD;  Location: [facility];  Service: Cardiovascular;  Laterality: N/A;  . ELECTROPHYSIOLOGIC STUDY N/A 09/06/2015   Procedure: Atrial Fibrillation Ablation;  Surgeon: Thompson Grayer, MD;  Location: [facility];  Service: Cardiovascular;  Laterality: N/A;  . ELECTROPHYSIOLOGIC STUDY N/A 07/12/2016   redo afib ablation by Dr Rayann Heman  . EXCISIONAL HEMORRHOIDECTOMY     "inside and out"  . FINE NEEDLE ASPIRATION Right    knee; "drew ~ 1 quart off"  . HERNIA REPAIR    . LAPAROSCOPIC CHOLECYSTECTOMY    . MELANOMA EXCISION Right    "neck"  . NM MYOCAR PERF WALL MOTION  02/21/2012   EF 61% ,EXERCISE 7 METS. exercise stopped due to wheezing and shortness of breathe  . PROSTATECTOMY    . SHOULDER OPEN ROTATOR CUFF REPAIR Right X 2  . TEE WITHOUT CARDIOVERSION N/A 09/05/2015   Procedure: TRANSESOPHAGEAL ECHOCARDIOGRAM (TEE);  Surgeon: Sueanne Margarita, MD;  Location: [facility] ENDOSCOPY;  Service: Cardiovascular;  Laterality: N/A;    Current Outpatient Prescriptions  Medication Sig Dispense Refill  . apixaban (ELIQUIS) 5 MG TABS tablet Take 5 mg by mouth 2 (two) times daily.    . carvedilol (  COREG) 12.5 MG tablet Take 0.5 tablets (6.25 mg total) by mouth 2 (two) times daily with a meal. 180 tablet 3  . furosemide (LASIX) 20 MG tablet Take 2 tablets (40 mg total) by mouth daily. 60 tablet 3  . gemfibrozil (LOPID) 600 MG tablet Take 600 mg by mouth at bedtime.     [name] glimepiride (AMARYL) 2 MG tablet Take 2 mg by mouth 2 (two) times daily.     . Insulin Glargine (LANTUS [place]) Inject 26 Units into the skin at bedtime.     [name] lisinopril (PRINIVIL,ZESTRIL) 20 MG tablet Take 20 mg by mouth every morning.     . Magnesium Oxide 400 (240 Mg) MG TABS Take 400 mg by mouth daily. 30 tablet   . metFORMIN (GLUCOPHAGE) 500 MG tablet  Take 1,000 mg by mouth 2 (two) times daily.    [name] latanoprost (XALATAN) 0.005 % ophthalmic solution Place 1 drop into both eyes at bedtime.     No current facility-administered medications for this encounter.     Allergies  Allergen Reactions  . Tape Rash and Other (See Comments)    Causes skin redness, Use paper tape only.    Social History   Social History  . Marital status: Married    Spouse name: N/A  . Number of children: N/A  . Years of education: N/A   Occupational History  . Retired    Social History Main Topics  . Smoking status: Former Smoker    Packs/day: 2.00    Years: 27.00    Types: Cigarettes    Quit date: 10/31/1983  . Smokeless tobacco: Never Used  . Alcohol use No     Comment: "used to drink; stopped ~ 2008"  . Drug use: No  . Sexual activity: Not Currently   Other Topics Concern  . Not on file   Social History Narrative   Lives in [place], [place] with wife.    Family History  Problem Relation Age of Onset  . Heart disease Mother   . Lung cancer Mother   . Heart attack Mother 29  . Stroke Brother   . Healthy Daughter   . Diabetes Father   . Heart disease Father     ROS- All systems are reviewed and negative except as per the HPI above  Physical Exam: Vitals:   01/09/17 1413  BP: 140/82  Pulse: 60  Weight: 241 lb (109.3 kg)  Height: 5\' 10"  (1.778 m)    GEN- The patient is well appearing, alert and oriented x 3 today.   Head- normocephalic, atraumatic Eyes-  Sclera clear, conjunctiva pink Ears- hearing intact Oropharynx- clear Neck- supple, no JVP Lymph- no cervical lymphadenopathy Lungs- Clear to ausculation bilaterally, normal work of breathing Heart- Regular rate and rhythm, no murmurs, rubs or gallops, PMI not laterally displaced GI- soft, NT, ND, + BS Extremities- no clubbing, cyanosis, or edema MS- no significant deformity or atrophy Skin- no rash or lesion Psych- euthymic mood, full affect Neuro- strength and sensation are  intact  EKG- Sinus brady with first degree AV block at 60 bpm, pr int 230 ms, qtc 420 ms Epic records reviewed  Assessment and Plan: 1. Persistent afib  Ablation x 2 with last ablation 07/12/16 Failed tikosyn in the past Continue carvedilol 12.5 mg bid Continue eliquis 5 mg bid  F/u with Dr. Rayann Heman  In 3 months as scheduled afib clinic as needed  Butch Penny C. Jayna Mulnix, [facility] [address]  [place], [place] [address] [phone]

## 2017-01-22 ENCOUNTER — Other Ambulatory Visit (INDEPENDENT_AMBULATORY_CARE_PROVIDER_SITE_OTHER): Payer: Self-pay | Admitting: *Deleted

## 2017-01-22 DIAGNOSIS — Z800 Family history of malignant neoplasm of digestive organs: Secondary | ICD-10-CM

## 2017-01-22 DIAGNOSIS — Z86010 Personal history of colonic polyps: Secondary | ICD-10-CM | POA: Insufficient documentation

## 2017-04-08 ENCOUNTER — Encounter: Payer: Self-pay | Admitting: Internal Medicine

## 2017-04-08 ENCOUNTER — Ambulatory Visit (INDEPENDENT_AMBULATORY_CARE_PROVIDER_SITE_OTHER): Payer: Medicare Other | Admitting: Internal Medicine

## 2017-04-08 VITALS — BP 142/72 | HR 63 | Ht 69.5 in | Wt 240.8 lb

## 2017-04-08 DIAGNOSIS — I4819 Other persistent atrial fibrillation: Secondary | ICD-10-CM

## 2017-04-08 DIAGNOSIS — I481 Persistent atrial fibrillation: Secondary | ICD-10-CM

## 2017-04-08 DIAGNOSIS — I519 Heart disease, unspecified: Secondary | ICD-10-CM

## 2017-04-08 DIAGNOSIS — I420 Dilated cardiomyopathy: Secondary | ICD-10-CM | POA: Diagnosis not present

## 2017-04-08 DIAGNOSIS — I480 Paroxysmal atrial fibrillation: Secondary | ICD-10-CM

## 2017-04-08 NOTE — Patient Instructions (Signed)

## 2017-04-08 NOTE — Progress Notes (Signed)
PCP: [name], MD Primary Cardiologist: Dr Verlene Mayer is a 76 y.o. male who presents today for routine electrophysiology followup.  Since last being seen in our clinic, the patient reports doing very well.  He has rare palpitations at night but feels that his AF is controlled.  Today, he denies symptoms of chest pain, shortness of breath,  lower extremity edema, dizziness, presyncope, or syncope.  The patient is otherwise without complaint today.   Past Medical History:  Diagnosis Date  . Arthritis    "hands probably" (05/04/2015)  . CAD (coronary artery disease)    a. Cath 03/17/15 showing 100% ostial D1, 50% prox LAD to mid LAD, 40% RPDA stenosis. Med rx.  . Childhood asthma   . Chronic bronchitis ([place])    "had it several times til I quit smoking"  . Chronic constipation   . Chronic lower back pain   . Glaucoma   . History of gout   . Hyperlipidemia   . Hypertension   . Kidney stones    "found during prostate OR; still in there as far as I know" (05/04/2015)  . Melanoma of neck ([place])    "right; came back IV; no chemo"  . NICM (nonischemic cardiomyopathy) ([place])   . OSA on CPAP 2012  . Paroxysmal atrial fibrillation (HCC)    a. 04/2015 Tikosyn loading, DCCV x 1;  b. CHA2DS2VASc = 5-->Eliquis.  . Prostate cancer ([place])   . Type II diabetes mellitus ([place])    Past Surgical History:  Procedure Laterality Date  . ABDOMINAL HERNIA REPAIR     w/mesh  . CARDIAC CATHETERIZATION N/A 03/17/2015   Procedure: Left Heart Cath and Coronary Angiography;  Surgeon: Lorretta Harp, MD;  Location: [facility];  Service: Cardiovascular;  Laterality: N/A;  . carotid doppler  09/17/2008   rigt and left ICAs 0-49%;mildly  abnormal  . DOPPLER ECHOCARDIOGRAPHY  05/25/2009   EF 50-55%,LA mildly dilated, LV function normal  . ELECTROPHYSIOLOGIC STUDY N/A 04/05/2015   Procedure: Cardioversion;  Surgeon: Sanda Klein, MD;  Location: [facility];  Service: Cardiovascular;   Laterality: N/A;  . ELECTROPHYSIOLOGIC STUDY N/A 09/06/2015   Procedure: Atrial Fibrillation Ablation;  Surgeon: Thompson Grayer, MD;  Location: [facility];  Service: Cardiovascular;  Laterality: N/A;  . ELECTROPHYSIOLOGIC STUDY N/A 07/12/2016   redo afib ablation by Dr Rayann Heman  . EXCISIONAL HEMORRHOIDECTOMY     "inside and out"  . FINE NEEDLE ASPIRATION Right    knee; "drew ~ 1 quart off"  . HERNIA REPAIR    . LAPAROSCOPIC CHOLECYSTECTOMY    . MELANOMA EXCISION Right    "neck"  . NM MYOCAR PERF WALL MOTION  02/21/2012   EF 61% ,EXERCISE 7 METS. exercise stopped due to wheezing and shortness of breathe  . PROSTATECTOMY    . SHOULDER OPEN ROTATOR CUFF REPAIR Right X 2  . TEE WITHOUT CARDIOVERSION N/A 09/05/2015   Procedure: TRANSESOPHAGEAL ECHOCARDIOGRAM (TEE);  Surgeon: Sueanne Margarita, MD;  Location: [facility] ENDOSCOPY;  Service: Cardiovascular;  Laterality: N/A;    ROS- all systems are reviewed and negatives except as per HPI above  Current Outpatient Prescriptions  Medication Sig Dispense Refill  . apixaban (ELIQUIS) 5 MG TABS tablet Take 5 mg by mouth 2 (two) times daily.    . carvedilol (COREG) 12.5 MG tablet Take 0.5 tablets (6.25 mg total) by mouth 2 (two) times daily with a meal. 180 tablet 3  . furosemide (LASIX) 20  MG tablet Take 2 tablets (40 mg total) by mouth daily. 60 tablet 3  . gemfibrozil (LOPID) 600 MG tablet Take 600 mg by mouth at bedtime.     [name] glimepiride (AMARYL) 2 MG tablet Take 2 mg by mouth 2 (two) times daily.     . Insulin Glargine (LANTUS [place]) Inject 26 Units into the skin at bedtime.     [name] latanoprost (XALATAN) 0.005 % ophthalmic solution Place 1 drop into both eyes at bedtime.    [name] lisinopril (PRINIVIL,ZESTRIL) 20 MG tablet Take 20 mg by mouth every morning.     . metFORMIN (GLUCOPHAGE) 500 MG tablet Take 1,000 mg by mouth 2 (two) times daily.     No current facility-administered medications for this visit.     Physical Exam: Vitals:   04/08/17 1431    BP: (!) 142/72  Pulse: 63  Weight: 240 lb 12.8 oz (109.2 kg)  Height: 5' 9.5" (1.765 m)    GEN- The patient is well appearing, alert and oriented x 3 today.   Head- normocephalic, atraumatic Eyes-  Sclera clear, conjunctiva pink Ears- hearing intact Oropharynx- clear Lungs- Clear to ausculation bilaterally, normal work of breathing Heart- Regular rate and rhythm, no murmurs, rubs or gallops, PMI not laterally displaced GI- soft, NT, ND, + BS Extremities- no clubbing, cyanosis, or edema  EKG tracing ordered today is personally reviewed and shows sinus rhythm, PR 226, incomplete RBBB, LAHB  Assessment and Plan:  1. Persistent afib Doing well post ablation off AAD therapy Continue long term anticoagulation  2. Nonischemic CM EF has recovered with sinus rhythm Consider repeat echo upon return  3. HTN Stable No change required today  4. CAD No ischemic symptoms  5. OSA Reports compliance with CPAP  Return to see me in 6 months unless problems arise  Thompson Grayer MD, [facility] 04/08/2017 2:44 PM

## 2017-04-12 ENCOUNTER — Other Ambulatory Visit: Payer: Self-pay | Admitting: Cardiovascular Disease

## 2017-04-16 ENCOUNTER — Telehealth: Payer: Self-pay | Admitting: Cardiovascular Disease

## 2017-04-16 ENCOUNTER — Telehealth (INDEPENDENT_AMBULATORY_CARE_PROVIDER_SITE_OTHER): Payer: Self-pay | Admitting: *Deleted

## 2017-04-16 ENCOUNTER — Encounter (INDEPENDENT_AMBULATORY_CARE_PROVIDER_SITE_OTHER): Payer: Self-pay | Admitting: *Deleted

## 2017-04-16 DIAGNOSIS — Z86010 Personal history of colonic polyps: Secondary | ICD-10-CM

## 2017-04-16 DIAGNOSIS — Z800 Family history of malignant neoplasm of digestive organs: Secondary | ICD-10-CM

## 2017-04-16 NOTE — Telephone Encounter (Addendum)
Patient needs trilyte 

## 2017-04-16 NOTE — Telephone Encounter (Signed)
Routed to Dr. Gwenlyn Found for review

## 2017-04-16 NOTE — Telephone Encounter (Signed)
Referring MD/PCP: [place]   Procedure: tcs  Reason/Indication:  Hx polyps, fam hx colon ca  Has patient had this procedure before?  Yes, 2010 -- paper chart  If so, when, by whom and where?    Is there a family history of colon cancer?  Yes, aunt  Who?  What age when diagnosed?    Is patient diabetic?   yes      Does patient have prosthetic heart valve or mechanical valve?  no  Do you have a pacemaker?  no  Has patient ever had endocarditis? no  Has patient had joint replacement within last 12 months?  no  Does patient tend to be constipated or take laxatives? no  Does patient have a history of alcohol/drug use?  no  Is patient on Coumadin, Plavix and/or Aspirin? yes  Medications: eliquis 2.5 mg bid, carvedilol 12.5 mg bid, metformin 500 mg 2 tab bid, gemfibrozil 600 mg daily, glimepiride 2 mg bid, furosemide 20 mg daily, lisinopril 20 mg daily  Allergies: nkda  Medication Adjustment per Dr Laural Golden: eliquis 2 days, hold metformin & glimepiride evening before & morning of  Procedure date & time: 05/16/17

## 2017-04-16 NOTE — Telephone Encounter (Signed)
New Message     [facility] Pre-operative Risk Assessment    Request for surgical clearance:  What type of surgery is being performed? Colonoscopy  1. When is this surgery scheduled? 8/2  2. Are there any medications that need to be held prior to surgery and how long? 2 days prior  3. Name of physician performing surgery? Dr. Laural Golden   4. What is your office phone and fax number? [phone] fax [phone]  Tamera Reason 04/16/2017, 10:22 AM  _________________________________________________________________   (provider comments below)

## 2017-04-17 NOTE — Telephone Encounter (Signed)
Pt on Eliquis for Afib with CHADS 3 and no history of stroke. Per protocol ok to hold 24 hour prior to procedure.

## 2017-04-18 MED ORDER — PEG 3350-KCL-NA BICARB-NACL 420 G PO SOLR: 4000.0000 mL | Freq: Once | ORAL | 0 refills | Status: AC

## 2017-04-18 NOTE — Telephone Encounter (Signed)
Clearance routed to number provided via EPIC. 

## 2017-04-18 NOTE — Telephone Encounter (Signed)
agree

## 2017-05-16 ENCOUNTER — Ambulatory Visit (HOSPITAL_COMMUNITY)
Admission: RE | Admit: 2017-05-16 | Discharge: 2017-05-16 | Disposition: A | Discharge status: Home / Self Care | Payer: Medicare Other | Source: Ambulatory Visit | Attending: Internal Medicine | Admitting: Internal Medicine

## 2017-05-16 ENCOUNTER — Encounter (HOSPITAL_COMMUNITY)
Admission: RE | Disposition: A | Discharge status: Home / Self Care | Payer: Self-pay | Source: Ambulatory Visit | Attending: Internal Medicine

## 2017-05-16 ENCOUNTER — Encounter (HOSPITAL_COMMUNITY): Payer: Self-pay

## 2017-05-16 DIAGNOSIS — Z87891 Personal history of nicotine dependence: Secondary | ICD-10-CM | POA: Insufficient documentation

## 2017-05-16 DIAGNOSIS — E1139 Type 2 diabetes mellitus with other diabetic ophthalmic complication: Secondary | ICD-10-CM | POA: Diagnosis not present

## 2017-05-16 DIAGNOSIS — Z09 Encounter for follow-up examination after completed treatment for conditions other than malignant neoplasm: Secondary | ICD-10-CM | POA: Diagnosis not present

## 2017-05-16 DIAGNOSIS — I480 Paroxysmal atrial fibrillation: Secondary | ICD-10-CM | POA: Diagnosis not present

## 2017-05-16 DIAGNOSIS — Z8546 Personal history of malignant neoplasm of prostate: Secondary | ICD-10-CM | POA: Insufficient documentation

## 2017-05-16 DIAGNOSIS — M109 Gout, unspecified: Secondary | ICD-10-CM | POA: Diagnosis not present

## 2017-05-16 DIAGNOSIS — Z85820 Personal history of malignant melanoma of skin: Secondary | ICD-10-CM | POA: Insufficient documentation

## 2017-05-16 DIAGNOSIS — K5730 Diverticulosis of large intestine without perforation or abscess without bleeding: Secondary | ICD-10-CM | POA: Diagnosis not present

## 2017-05-16 DIAGNOSIS — D123 Benign neoplasm of transverse colon: Secondary | ICD-10-CM | POA: Insufficient documentation

## 2017-05-16 DIAGNOSIS — D122 Benign neoplasm of ascending colon: Secondary | ICD-10-CM | POA: Insufficient documentation

## 2017-05-16 DIAGNOSIS — Z800 Family history of malignant neoplasm of digestive organs: Secondary | ICD-10-CM | POA: Diagnosis not present

## 2017-05-16 DIAGNOSIS — I2510 Atherosclerotic heart disease of native coronary artery without angina pectoris: Secondary | ICD-10-CM | POA: Insufficient documentation

## 2017-05-16 DIAGNOSIS — I10 Essential (primary) hypertension: Secondary | ICD-10-CM | POA: Diagnosis not present

## 2017-05-16 DIAGNOSIS — Z86010 Personal history of colonic polyps: Secondary | ICD-10-CM | POA: Diagnosis not present

## 2017-05-16 DIAGNOSIS — Z1211 Encounter for screening for malignant neoplasm of colon: Secondary | ICD-10-CM | POA: Diagnosis present

## 2017-05-16 DIAGNOSIS — Z79899 Other long term (current) drug therapy: Secondary | ICD-10-CM | POA: Insufficient documentation

## 2017-05-16 DIAGNOSIS — K644 Residual hemorrhoidal skin tags: Secondary | ICD-10-CM | POA: Diagnosis not present

## 2017-05-16 DIAGNOSIS — Z794 Long term (current) use of insulin: Secondary | ICD-10-CM | POA: Diagnosis not present

## 2017-05-16 DIAGNOSIS — E785 Hyperlipidemia, unspecified: Secondary | ICD-10-CM | POA: Insufficient documentation

## 2017-05-16 DIAGNOSIS — Z7902 Long term (current) use of antithrombotics/antiplatelets: Secondary | ICD-10-CM | POA: Insufficient documentation

## 2017-05-16 DIAGNOSIS — Z87442 Personal history of urinary calculi: Secondary | ICD-10-CM | POA: Insufficient documentation

## 2017-05-16 DIAGNOSIS — H42 Glaucoma in diseases classified elsewhere: Secondary | ICD-10-CM | POA: Insufficient documentation

## 2017-05-16 DIAGNOSIS — G4733 Obstructive sleep apnea (adult) (pediatric): Secondary | ICD-10-CM | POA: Diagnosis not present

## 2017-05-16 HISTORY — PX: POLYPECTOMY: SHX5525

## 2017-05-16 HISTORY — PX: COLONOSCOPY: SHX5424

## 2017-05-16 LAB — GLUCOSE, CAPILLARY: Glucose-Capillary: 201 mg/dL — ABNORMAL HIGH (ref 65–99)

## 2017-05-16 SURGERY — COLONOSCOPY
Anesthesia: Moderate Sedation

## 2017-05-16 MED ORDER — SODIUM CHLORIDE 0.9 % IV SOLN
INTRAVENOUS | Status: DC
  Administered 2017-05-16: 09:00:00 via INTRAVENOUS

## 2017-05-16 MED ORDER — ENALAPRILAT 1.25 MG/ML IV SOLN
INTRAVENOUS | Status: DC | PRN
  Administered 2017-05-16 (×2): 1.25 mg via INTRAVENOUS

## 2017-05-16 MED ORDER — MEPERIDINE HCL 50 MG/ML IJ SOLN
INTRAMUSCULAR | Status: AC
  Filled 2017-05-16: qty 1

## 2017-05-16 MED ORDER — ENALAPRILAT 1.25 MG/ML IV SOLN
INTRAVENOUS | Status: AC
  Filled 2017-05-16: qty 2

## 2017-05-16 MED ORDER — MIDAZOLAM HCL 5 MG/5ML IJ SOLN
INTRAMUSCULAR | Status: AC
  Filled 2017-05-16: qty 10

## 2017-05-16 MED ORDER — STERILE WATER FOR IRRIGATION IR SOLN
Status: DC | PRN
  Administered 2017-05-16: 2.5 mL

## 2017-05-16 NOTE — Op Note (Signed)
[facility] Patient Name: Lucas Dudley Procedure Date: 05/16/2017 9:02 AM MRN: [id number] Date of Birth: [birth year] Attending MD: Hildred Laser , MD CSN: [id number] Age: 76 Admit Type: Outpatient Procedure:                Colonoscopy Indications:              High risk colon cancer surveillance: Personal                            history of colonic polyps Providers:                Hildred Laser, MD, Otis Peak B. Sharon Seller, RN, Randa Spike, Technician Referring MD:             [name], MD Medicines:                enalapril 2.5 mg IV Complications:            No immediate complications. Estimated Blood Loss:     Estimated blood loss was minimal. Procedure:                Pre-Anesthesia Assessment:                           - Prior to the procedure, a History and Physical                            was performed, and patient medications and                            allergies were reviewed. The patient's tolerance of                            previous anesthesia was also reviewed. The risks                            and benefits of the procedure and the sedation                            options and risks were discussed with the patient.                            All questions were answered, and informed consent                            was obtained. Prior Anticoagulants: The patient                            last took Eliquis (apixaban) 2 days prior to the                            procedure. ASA Grade Assessment: III - A patient  with severe systemic disease. After reviewing the                            risks and benefits, the patient was deemed in                            satisfactory condition to undergo the procedure.                           After obtaining informed consent, the colonoscope                            was passed under direct vision. Throughout the                            procedure, the  patient's blood pressure, pulse, and                            oxygen saturations were monitored continuously. The                            [id number] ([id number]) scope was introduced through                            the anus and advanced to the the cecum, identified                            by appendiceal orifice and ileocecal valve. The                            colonoscopy was technically difficult and complex                            due to a tortuous colon. Successful completion of                            the procedure was aided by using manual pressure                            and withdrawing and reinserting the scope. The                            patient tolerated the procedure well. The quality                            of the bowel preparation was adequate. The                            ileocecal valve, appendiceal orifice, and rectum                            were photographed. Scope In: 9:28:29 AM Scope Out: 10:11:20 AM Scope Withdrawal Time: 0 hours 13 minutes 20 seconds  Total Procedure Duration:  0 hours 42 minutes 51 seconds  Findings:      The perianal and digital rectal examinations were normal.      Two sessile polyps were found in the transverse colon and ascending       colon. The polyps were small in size. These were biopsied with a cold       forceps for histology. The pathology specimen was placed into Bottle       Number 1.      Scattered medium-mouthed diverticula were found in the sigmoid colon.      External hemorrhoids were found during retroflexion. The hemorrhoids       were small. Impression:               - Two small polyps in the transverse colon and in                            the ascending colon. Biopsied.                           - Diverticulosis in the sigmoid colon.                           - External hemorrhoids. Moderate Sedation:      sedation not given Recommendation:           - Patient has a contact number available for                             emergencies. The signs and symptoms of potential                            delayed complications were discussed with the                            patient. Return to normal activities tomorrow.                            Written discharge instructions were provided to the                            patient.                           - High fiber diet today.                           - Continue present medications.                           - Resume Eliquis (apixaban) at prior dose tomorrow.                           - Await pathology results.                           - Repeat colonoscopy date to be determined after  pending pathology results are reviewed. Procedure Code(s):        --- Professional ---                           [phone], Colonoscopy, flexible; with biopsy, single                            or multiple Diagnosis Code(s):        --- Professional ---                           Z86.010, Personal history of colonic polyps                           D12.3, Benign neoplasm of transverse colon (hepatic                            flexure or splenic flexure)                           D12.2, Benign neoplasm of ascending colon                           K64.4, Residual hemorrhoidal skin tags                           K57.30, Diverticulosis of large intestine without                            perforation or abscess without bleeding CPT copyright 2016 [employer]. All rights reserved. The codes documented in this report are preliminary and upon coder review may  be revised to meet current compliance requirements. Hildred Laser, MD Hildred Laser, MD 05/16/2017 10:21:22 AM This report has been signed electronically. Number of Addenda: 0

## 2017-05-16 NOTE — H&P (Signed)
Lucas Dudley is an 76 y.o. male.   Chief Complaint: Patient is here for colonoscopy. HPI: Patient is 76 year old Caucasian male who was history of colonic adenomas and is in for surveillance colonoscopy. He denies abdominal pain change in bowel habits or rectal bleeding. Last colonoscopy was in 2010.  Family history is positive for colon carcinoma in maternal aunt who was in her 68s at the time of diagnosis. Patient has been off anticoagulant for 2 days.  Past Medical History:  Diagnosis Date  . Arthritis    "hands probably" (05/04/2015)  . CAD (coronary artery disease)    a. Cath 03/17/15 showing 100% ostial D1, 50% prox LAD to mid LAD, 40% RPDA stenosis. Med rx.  . Childhood asthma   . Chronic bronchitis ([place])    "had it several times til I quit smoking"  . Chronic constipation   . Chronic lower back pain   . Glaucoma   . History of gout   . Hyperlipidemia   . Hypertension   . Kidney stones    "found during prostate OR; still in there as far as I know" (05/04/2015)  . 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 NICM (nonischemic cardiomyopathy) ([place])   . OSA on CPAP 2012  . Paroxysmal atrial fibrillation ([facility])    a. 04/2015 Tikosyn loading, DCCV x 1;  b. CHA2DS2VASc = 5-->Eliquis.  . Prostate cancer ([place])   . Type II diabetes mellitus ([place])     Past Surgical History:  Procedure Laterality Date  . ABDOMINAL HERNIA REPAIR     w/mesh  . CARDIAC CATHETERIZATION N/A 03/17/2015   Procedure: Left Heart Cath and Coronary Angiography;  Surgeon: Lorretta Harp, MD;  Location: [place] [facility];  Service: Cardiovascular;  Laterality: N/A;  . carotid doppler  09/17/2008   rigt and left ICAs 0-49%;mildly  abnormal  . DOPPLER ECHOCARDIOGRAPHY  05/25/2009   EF 50-55%,LA mildly dilated, LV function normal  . ELECTROPHYSIOLOGIC STUDY N/A 04/05/2015   Procedure: Cardioversion;  Surgeon: Sanda Klein, MD;  Location: [place] [facility];  Service: Cardiovascular;  Laterality:  N/A;  . ELECTROPHYSIOLOGIC STUDY N/A 09/06/2015   Procedure: Atrial Fibrillation Ablation;  Surgeon: Thompson Grayer, MD;  Location: [place] [facility];  Service: Cardiovascular;  Laterality: N/A;  . ELECTROPHYSIOLOGIC STUDY N/A 07/12/2016   redo afib ablation by Dr Rayann Heman  . EXCISIONAL HEMORRHOIDECTOMY     "inside and out"  . FINE NEEDLE ASPIRATION Right    knee; "drew ~ 1 quart off"  . HERNIA REPAIR    . LAPAROSCOPIC CHOLECYSTECTOMY    . MELANOMA EXCISION Right    "neck"  . NM MYOCAR PERF WALL MOTION  02/21/2012   EF 61% ,EXERCISE 7 METS. exercise stopped due to wheezing and shortness of breathe  . PROSTATECTOMY    . SHOULDER OPEN ROTATOR CUFF REPAIR Right X 2  . TEE WITHOUT CARDIOVERSION N/A 09/05/2015   Procedure: TRANSESOPHAGEAL ECHOCARDIOGRAM (TEE);  Surgeon: Sueanne Margarita, MD;  Location: [facility];  Service: Cardiovascular;  Laterality: N/A;    Family History  Problem Relation Age of Onset  . Heart disease Mother   . Lung cancer Mother   . Heart attack Mother 72  . Stroke Brother   . Healthy Daughter   . Diabetes Father   . Heart disease Father    Social History:  reports that he quit smoking about 33 years ago. His smoking use included Cigarettes. He has a 54.00  pack-year smoking history. He has never used smokeless tobacco. He reports that he does not drink alcohol or use drugs.  Allergies:  Allergies  Allergen Reactions  . Tape Rash and Other (See Comments)    Causes skin redness, Use paper tape only.    Medications Prior to Admission  Medication Sig Dispense Refill  . acetaminophen (TYLENOL) 650 MG CR tablet Take 650 mg by mouth at bedtime.    . Carboxymethylcellul-Glycerin (LUBRICATING EYE DROPS OP) Apply 1 drop to eye daily as needed (itching eyes).    . carvedilol (COREG) 12.5 MG tablet Take 0.5 tablets (6.25 mg total) by mouth 2 (two) times daily with a meal. (Patient taking differently: Take 6.25-12.5 mg by mouth 2 (two) times daily with a meal. Take 6.25  mg in the morning and 12.5 at night) 180 tablet 3  . diphenhydramine-acetaminophen (TYLENOL PM EXTRA STRENGTH) 25-500 MG TABS tablet Take 2 tablets by mouth at bedtime.    [name] ELIQUIS 5 MG TABS tablet TAKE 1 TABLET TWICE A DAY 180 tablet 0  . furosemide (LASIX) 20 MG tablet Take 2 tablets (40 mg total) by mouth daily. 60 tablet 3  . gemfibrozil (LOPID) 600 MG tablet Take 600 mg by mouth at bedtime.     [name] glimepiride (AMARYL) 2 MG tablet Take 2 mg by mouth 2 (two) times daily.     . Insulin Glargine (BASAGLAR KWIKPEN) 100 UNIT/ML SOPN Inject 30 Units into the skin at bedtime.    [name] latanoprost (XALATAN) 0.005 % ophthalmic solution Place 1 drop into both eyes at bedtime.    [name] lisinopril (PRINIVIL,ZESTRIL) 20 MG tablet Take 20 mg by mouth every morning.     . Melatonin 5 MG TABS Take 5 mg by mouth at bedtime.    . metFORMIN (GLUCOPHAGE) 500 MG tablet Take 1,000 mg by mouth 2 (two) times daily.      No results found for this or any previous visit (from the past 48 hour(s)). No results found.  ROS  Blood pressure (!) 172/87, pulse 62, temperature 98.6 F (37 C), temperature source Oral, resp. rate 14, height 5\' 10"  (1.778 m), weight 235 lb (106.6 kg), SpO2 96 %. Physical Exam  Constitutional: He appears well-developed and well-nourished.  HENT:  Mouth/Throat: Oropharynx is clear and moist.  Eyes: Conjunctivae are normal. No scleral icterus.  Neck: No thyromegaly present.  Cardiovascular: Normal rate, regular rhythm and normal heart sounds.   No murmur heard. Respiratory: Effort normal and breath sounds normal.  GI:  Abdomen is full with small sub-from lateral scar. Umbilical hernia noted. It is completely reducible.  Musculoskeletal: He exhibits no edema.  Lymphadenopathy:    He has no cervical adenopathy.  Neurological: He is alert.  Skin: Skin is warm and dry.     Assessment/Plan History of colonic adenomas. Family history of CRC in second degree relative. Surveillance  colonoscopy.  Hildred Laser, MD 05/16/2017, 9:19 AM

## 2017-05-16 NOTE — Discharge Instructions (Signed)
Colon Polyps Polyps are tissue growths inside the body. Polyps can grow in many places, including the large intestine (colon). A polyp may be a round bump or a mushroom-shaped growth. You could have one polyp or several. Most colon polyps are noncancerous (benign). However, some colon polyps can become cancerous over time. What are the causes? The exact cause of colon polyps is not known. What increases the risk? This condition is more likely to develop in people who:  Have a family history of colon cancer or colon polyps.  Are older than 23 or older than 45 if they are African American.  Have inflammatory bowel disease, such as ulcerative colitis or Crohn disease.  Are overweight.  Smoke cigarettes.  Do not get enough exercise.  Drink too much alcohol.  Eat a diet that is: ? High in fat and red meat. ? Low in fiber.  Had childhood cancer that was treated with abdominal radiation.  What are the signs or symptoms? Most polyps do not cause symptoms. If you have symptoms, they may include:  Blood coming from your rectum when having a bowel movement.  Blood in your stool.The stool may look dark red or black.  A change in bowel habits, such as constipation or diarrhea.  How is this diagnosed? This condition is diagnosed with a colonoscopy. This is a procedure that uses a lighted, flexible scope to look at the inside of your colon. How is this treated? Treatment for this condition involves removing any polyps that are found. Those polyps will then be tested for cancer. If cancer is found, your health care provider will talk to you about options for colon cancer treatment. Follow these instructions at home: Diet  Eat plenty of fiber, such as fruits, vegetables, and whole grains.  Eat foods that are high in calcium and vitamin D, such as milk, cheese, yogurt, eggs, liver, fish, and broccoli.  Limit foods high in fat, red meats, and processed meats, such as hot dogs, sausage,  bacon, and lunch meats.  Maintain a healthy weight, or lose weight if recommended by your health care provider. General instructions  Do not smoke cigarettes.  Do not drink alcohol excessively.  Keep all follow-up visits as told by your health care provider. This is important. This includes keeping regularly scheduled colonoscopies. Talk to your health care provider about when you need a colonoscopy.  Exercise every day or as told by your health care provider. Contact a health care provider if:  You have new or worsening bleeding during a bowel movement.  You have new or increased blood in your stool.  You have a change in bowel habits.  You unexpectedly lose weight. This information is not intended to replace advice given to you by your health care provider. Make sure you discuss any questions you have with your health care provider. Document Released: 06/27/2004 Document Revised: 03/08/2016 Document Reviewed: 08/22/2015 Elsevier Interactive Patient Education  2018 [place].  High-Fiber Diet Fiber, also called dietary fiber, is a type of carbohydrate found in fruits, vegetables, whole grains, and beans. A high-fiber diet can have many health benefits. Your health care provider may recommend a high-fiber diet to help:  Prevent constipation. Fiber can make your bowel movements more regular.  Lower your cholesterol.  Relieve hemorrhoids, uncomplicated diverticulosis, or irritable bowel syndrome.  Prevent overeating as part of a weight-loss plan.  Prevent heart disease, type 2 diabetes, and certain cancers.  What is my plan? The recommended daily intake of fiber includes:  38 grams for men under age 38.  2 grams for men over age 78.  72 grams for women under age 34.  77 grams for women over age 44.  You can get the recommended daily intake of dietary fiber by eating a variety of fruits, vegetables, grains, and beans. Your health care provider may also recommend a  fiber supplement if it is not possible to get enough fiber through your diet. What do I need to know about a high-fiber diet?  Fiber supplements have not been widely studied for their effectiveness, so it is better to get fiber through food sources.  Always check the fiber content on thenutrition facts label of any prepackaged food. Look for foods that contain at least 5 grams of fiber per serving.  Ask your dietitian if you have questions about specific foods that are related to your condition, especially if those foods are not listed in the following section.  Increase your daily fiber consumption gradually. Increasing your intake of dietary fiber too quickly may cause bloating, cramping, or gas.  Drink plenty of water. Water helps you to digest fiber. What foods can I eat? Grains Whole-grain breads. Multigrain cereal. Oats and oatmeal. Brown rice. Barley. Bulgur wheat. Platter. Bran muffins. Popcorn. Rye wafer crackers. Vegetables Sweet potatoes. Spinach. Kale. Artichokes. Cabbage. Broccoli. Green peas. Carrots. Squash. Fruits Berries. Pears. Apples. Oranges. Avocados. Prunes and raisins. Dried figs. Meats and Other Protein Sources Navy, kidney, pinto, and soy beans. Split peas. Lentils. Nuts and seeds. Dairy Fiber-fortified yogurt. Beverages Fiber-fortified soy milk. Fiber-fortified orange juice. Other Fiber bars. The items listed above may not be a complete list of recommended foods or beverages. Contact your dietitian for more options. What foods are not recommended? Grains White bread. Pasta made with refined flour. White rice. Vegetables Fried potatoes. Canned vegetables. Well-cooked vegetables. Fruits Fruit juice. Cooked, strained fruit. Meats and Other Protein Sources Fatty cuts of meat. Fried Sales executive or fried fish. Dairy Milk. Yogurt. Cream cheese. Sour cream. Beverages Soft drinks. Other Cakes and pastries. Butter and oils. The items listed above may not be a  complete list of foods and beverages to avoid. Contact your dietitian for more information. What are some tips for including high-fiber foods in my diet?  Eat a wide variety of high-fiber foods.  Make sure that half of all grains consumed each day are whole grains.  Replace breads and cereals made from refined flour or white flour with whole-grain breads and cereals.  Replace white rice with brown rice, bulgur wheat, or millet.  Start the day with a breakfast that is high in fiber, such as a cereal that contains at least 5 grams of fiber per serving.  Use beans in place of meat in soups, salads, or pasta.  Eat high-fiber snacks, such as berries, raw vegetables, nuts, or popcorn. This information is not intended to replace advice given to you by your health care provider. Make sure you discuss any questions you have with your health care provider. Document Released: 10/01/2005 Document Revised: 03/08/2016 Document Reviewed: 03/16/2014 Elsevier Interactive Patient Education  2017 [employer]    Diverticulosis Diverticulosis is a condition that develops when small pouches (diverticula) form in the wall of the large intestine (colon). The colon is where water is absorbed and stool is formed. The pouches form when the inside layer of the colon pushes through weak spots in the outer layers of the colon. You may have a few pouches or many of them. What are the causes?  The cause of this condition is not known. What increases the risk? The following factors may make you more likely to develop this condition:  Being older than age 74. Your risk for this condition increases with age. Diverticulosis is rare among people younger than age 55. By age 61, many people have it.  Eating a low-fiber diet.  Having frequent constipation.  Being overweight.  Not getting enough exercise.  Smoking.  Taking over-the-counter pain medicines, like aspirin and ibuprofen.  Having a family history of  diverticulosis.  What are the signs or symptoms? In most people, there are no symptoms of this condition. If you do have symptoms, they may include:  Bloating.  Cramps in the abdomen.  Constipation or diarrhea.  Pain in the lower left side of the abdomen.  How is this diagnosed? This condition is most often diagnosed during an exam for other colon problems. Because diverticulosis usually has no symptoms, it often cannot be diagnosed independently. This condition may be diagnosed by:  Using a flexible scope to examine the colon (colonoscopy).  Taking an X-ray of the colon after dye has been put into the colon (barium enema).  Doing a CT scan.  How is this treated? You may not need treatment for this condition if you have never developed an infection related to diverticulosis. If you have had an infection before, treatment may include:  Eating a high-fiber diet. This may include eating more fruits, vegetables, and grains.  Taking a fiber supplement.  Taking a live bacteria supplement (probiotic).  Taking medicine to relax your colon.  Taking antibiotic medicines.  Follow these instructions at home:  Drink 6-8 glasses of water or more each day to prevent constipation.  Try not to strain when you have a bowel movement.  If you have had an infection before: ? Eat more fiber as directed by your health care provider or your diet and nutrition specialist (dietitian). ? Take a fiber supplement or probiotic, if your health care provider approves.  Take over-the-counter and prescription medicines only as told by your health care provider.  If you were prescribed an antibiotic, take it as told by your health care provider. Do not stop taking the antibiotic even if you start to feel better.  Keep all follow-up visits as told by your health care provider. This is important. Contact a health care provider if:  You have pain in your abdomen.  You have bloating.  You have  cramps.  You have not had a bowel movement in 3 days. Get help right away if:  Your pain gets worse.  Your bloating becomes very bad.  You have a fever or chills, and your symptoms suddenly get worse.  You vomit.  You have bowel movements that are bloody or black.  You have bleeding from your rectum. Summary  Diverticulosis is a condition that develops when small pouches (diverticula) form in the wall of the large intestine (colon).  You may have a few pouches or many of them.  This condition is most often diagnosed during an exam for other colon problems.  If you have had an infection related to diverticulosis, treatment may include increasing the fiber in your diet, taking supplements, or taking medicines. This information is not intended to replace advice given to you by your health care provider. Make sure you discuss any questions you have with your health care provider. Document Released: 06/28/2004 Document Revised: 08/20/2016 Document Reviewed: 08/20/2016 Elsevier Interactive Patient Education  2017 [employer].  Colonoscopy, Adult, Care After This sheet gives you information about how to care for yourself after your procedure. Your doctor may also give you more specific instructions. If you have problems or questions, call your doctor. Follow these instructions at home: General instructions   For the first 24 hours after the procedure: ? Do not drive or use machinery. ? Do not sign important documents. ? Do not drink alcohol. ? Do your daily activities more slowly than normal. ? Eat foods that are soft and easy to digest. ? Rest often.  Take over-the-counter or prescription medicines only as told by your doctor.  It is up to you to get the results of your procedure. Ask your doctor, or the department performing the procedure, when your results will be ready. To help cramping and bloating:  Try walking around.  Put heat on your belly (abdomen) as told  by your doctor. Use a heat source that your doctor recommends, such as a moist heat pack or a heating pad. ? Put a towel between your skin and the heat source. ? Leave the heat on for 20-30 minutes. ? Remove the heat if your skin turns bright red. This is especially important if you cannot feel pain, heat, or cold. You can get burned. Eating and drinking  Drink enough fluid to keep your pee (urine) clear or pale yellow.  Return to your normal diet as told by your doctor. Avoid heavy or fried foods that are hard to digest.  Avoid drinking alcohol for as long as told by your doctor. Contact a doctor if:  You have blood in your poop (stool) 2-3 days after the procedure. Get help right away if:  You have more than a small amount of blood in your poop.  You see large clumps of tissue (blood clots) in your poop.  Your belly is swollen.  You feel sick to your stomach (nauseous).  You throw up (vomit).  You have a fever.  You have belly pain that gets worse, and medicine does not help your pain. This information is not intended to replace advice given to you by your health care provider. Make sure you discuss any questions you have with your health care provider. Document Released: 11/03/2010 Document Revised: 06/25/2016 Document Reviewed: 06/25/2016 Elsevier Interactive Patient Education  2017 [place] tomorrow evening. Resume other medications as before. High fiber diet. Physician will call with biopsy results.

## 2017-05-20 ENCOUNTER — Encounter (HOSPITAL_COMMUNITY): Payer: Self-pay | Admitting: Internal Medicine

## 2017-07-10 ENCOUNTER — Other Ambulatory Visit: Payer: Self-pay

## 2017-07-10 MED ORDER — APIXABAN 5 MG PO TABS: 5.0000 mg | ORAL_TABLET | Freq: Two times a day (BID) | ORAL | 0 refills | Status: DC

## 2017-09-18 ENCOUNTER — Other Ambulatory Visit: Payer: Self-pay | Admitting: Cardiovascular Disease

## 2017-10-14 ENCOUNTER — Ambulatory Visit: Payer: Medicare Other | Admitting: Internal Medicine

## 2017-10-14 ENCOUNTER — Encounter: Payer: Self-pay | Admitting: Internal Medicine

## 2017-10-14 VITALS — BP 130/82 | HR 49 | Ht 70.0 in | Wt 234.0 lb

## 2017-10-14 DIAGNOSIS — I420 Dilated cardiomyopathy: Secondary | ICD-10-CM | POA: Diagnosis not present

## 2017-10-14 DIAGNOSIS — I10 Essential (primary) hypertension: Secondary | ICD-10-CM | POA: Diagnosis not present

## 2017-10-14 DIAGNOSIS — I481 Persistent atrial fibrillation: Secondary | ICD-10-CM | POA: Diagnosis not present

## 2017-10-14 DIAGNOSIS — I480 Paroxysmal atrial fibrillation: Secondary | ICD-10-CM

## 2017-10-14 DIAGNOSIS — I4819 Other persistent atrial fibrillation: Secondary | ICD-10-CM

## 2017-10-14 DIAGNOSIS — I2510 Atherosclerotic heart disease of native coronary artery without angina pectoris: Secondary | ICD-10-CM

## 2017-10-14 MED ORDER — CARVEDILOL 6.25 MG PO TABS: 6.2500 mg | ORAL_TABLET | Freq: Two times a day (BID) | ORAL | 3 refills | Status: DC

## 2017-10-14 NOTE — Progress Notes (Signed)
PCP: [name], MD Primary Cardiologist: Dr Gwenlyn Found Primary EP: Dr Genevie Ann is a 76 y.o. male who presents today for routine electrophysiology followup.  Since last being seen in our clinic, the patient reports doing very well.  No afib since his last visit. Today, he denies symptoms of palpitations, chest pain, shortness of breath,  lower extremity edema, dizziness, presyncope, or syncope.  The patient is otherwise without complaint today.   Past Medical History:  Diagnosis Date  . Arthritis    "hands probably" (05/04/2015)  . CAD (coronary artery disease)    a. Cath 03/17/15 showing 100% ostial D1, 50% prox LAD to mid LAD, 40% RPDA stenosis. Med rx.  . Childhood asthma   . Chronic bronchitis ([place])    "had it several times til I quit smoking"  . Chronic constipation   . Chronic lower back pain   . Glaucoma   . History of gout   . Hyperlipidemia   . Hypertension   . Kidney stones    "found during prostate OR; still in there as far as I know" (05/04/2015)  . 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 NICM (nonischemic cardiomyopathy) ([place])   . OSA on CPAP 2012  . Paroxysmal atrial fibrillation ([facility])    a. 04/2015 Tikosyn loading, DCCV x 1;  b. CHA2DS2VASc = 5-->Eliquis.  . Prostate cancer ([place])   . Type II diabetes mellitus ([place])    Past Surgical History:  Procedure Laterality Date  . ABDOMINAL HERNIA REPAIR     w/mesh  . CARDIAC CATHETERIZATION N/A 03/17/2015   Procedure: Left Heart Cath and Coronary Angiography;  Surgeon: Lorretta Harp, MD;  Location: [facility];  Service: Cardiovascular;  Laterality: N/A;  . carotid doppler  09/17/2008   rigt and left ICAs 0-49%;mildly  abnormal  . COLONOSCOPY N/A 05/16/2017   Procedure: COLONOSCOPY;  Surgeon: Rogene Houston, MD;  [facility];  Service: Endoscopy;  Laterality: N/A;  930  . DOPPLER ECHOCARDIOGRAPHY  05/25/2009   EF 50-55%,LA mildly dilated, LV function normal  .  ELECTROPHYSIOLOGIC STUDY N/A 04/05/2015   Procedure: Cardioversion;  Surgeon: Sanda Klein, MD;  Location: [facility];  Service: Cardiovascular;  Laterality: N/A;  . ELECTROPHYSIOLOGIC STUDY N/A 09/06/2015   Procedure: Atrial Fibrillation Ablation;  Surgeon: Thompson Grayer, MD;  Location: [facility];  Service: Cardiovascular;  Laterality: N/A;  . ELECTROPHYSIOLOGIC STUDY N/A 07/12/2016   redo afib ablation by Dr Rayann Heman  . EXCISIONAL HEMORRHOIDECTOMY     "inside and out"  . FINE NEEDLE ASPIRATION Right    knee; "drew ~ 1 quart off"  . HERNIA REPAIR    . LAPAROSCOPIC CHOLECYSTECTOMY    . MELANOMA EXCISION Right    "neck"  . NM MYOCAR PERF WALL MOTION  02/21/2012   EF 61% ,EXERCISE 7 METS. exercise stopped due to wheezing and shortness of breathe  . POLYPECTOMY  05/16/2017   Procedure: POLYPECTOMY;  Surgeon: Rogene Houston, MD;  [facility];  Service: Endoscopy;;  colon  . PROSTATECTOMY    . SHOULDER OPEN ROTATOR CUFF REPAIR Right X 2  . TEE WITHOUT CARDIOVERSION N/A 09/05/2015   Procedure: TRANSESOPHAGEAL ECHOCARDIOGRAM (TEE);  Surgeon: Sueanne Margarita, MD;  Location: [facility] ENDOS[facility];  Service: Cardiovascular;  Laterality: N/A;    ROS- all systems are reviewed and negatives except as per HPI above  Current Outpatient Medications  Medication Sig Dispense  Refill  . acetaminophen (TYLENOL) 650 MG CR tablet Take 650 mg by mouth at bedtime.    . Carboxymethylcellul-Glycerin (LUBRICATING EYE DROPS OP) Apply 1 drop to eye daily as needed (itching eyes).    . carvedilol (COREG) 12.5 MG tablet Take one-half tablet (6.25 mg) in the mornings and one tablet (12.5 mg) in the evenings by mouth daily.    . diphenhydramine-acetaminophen (TYLENOL PM EXTRA STRENGTH) 25-500 MG TABS tablet Take 2 tablets by mouth at bedtime.    [name] ELIQUIS 5 MG TABS tablet TAKE 1 TABLET TWICE A DAY 180 tablet 0  . furosemide (LASIX) 20 MG tablet Take 2 tablets (40 mg total) by mouth daily. 60 tablet 3    . gemfibrozil (LOPID) 600 MG tablet Take 600 mg by mouth at bedtime.     [name] glimepiride (AMARYL) 2 MG tablet Take 2 mg by mouth 2 (two) times daily.     . Insulin Glargine (BASAGLAR KWIKPEN) 100 UNIT/ML SOPN Inject 40 Units into the skin at bedtime.     [name] latanoprost (XALATAN) 0.005 % ophthalmic solution Place 1 drop into both eyes at bedtime.    [name] lisinopril (PRINIVIL,ZESTRIL) 20 MG tablet Take 20 mg by mouth every morning.     . Melatonin 5 MG TABS Take 5 mg by mouth at bedtime.    . metFORMIN (GLUCOPHAGE) 500 MG tablet Take 1,000 mg by mouth 2 (two) times daily.     No current facility-administered medications for this visit.     Physical Exam: Vitals:   10/14/17 1404  BP: 130/82  Pulse: (!) 49  Weight: 234 lb (106.1 kg)  Height: 5\' 10"  (1.778 m)    GEN- The patient is well appearing, alert and oriented x 3 today.   Head- normocephalic, atraumatic Eyes-  Sclera clear, conjunctiva pink Ears- hearing intact Oropharynx- clear Lungs- Clear to ausculation bilaterally, normal work of breathing Heart- Regular rate and rhythm, no murmurs, rubs or gallops, PMI not laterally displaced GI- soft, NT, ND, + BS Extremities- no clubbing, cyanosis, or edema  EKG tracing ordered today is personally reviewed and shows sinus bradycardia 49 bpm, PR 234 msec, QRS 102 msec, incomplete RBB, LAD  Assessment and Plan:  1. Persistent atrial fibrillation Doing well off AAD therapy Continue long term anticoagulation  2. Nonischemic CM EF has normalized  Will repeat echo at this time   3. HTN Stable No change required today  4. CAD No ischemic symptoms Reduce coreg to 6.25mg  BID due to bradycardia  5. OSA Reports compliance with CPAP  Return to AF clinic in 6 months I will see in a year  Thompson Grayer MD, [facility] 10/14/2017 2:08 PM

## 2017-10-14 NOTE — Patient Instructions (Signed)
Medication Instructions:  Your physician has recommended you make the following change in your medication:  1) Decrease Carvedilol to 6.25 mg twice daily   Labwork: None ordered   Testing/Procedures: Your physician has requested that you have an echocardiogram. Echocardiography is a painless test that uses sound waves to create images of your heart. It provides your doctor with information about the size and shape of your heart and how well your heart's chambers and valves are working. This procedure takes approximately one hour. There are no restrictions for this procedure.    Follow-Up: Your physician recommends that you schedule a follow-up appointment in: 6 months with Roderic Palau, NP and 12 months with Dr Rayann Heman   Any Other Special Instructions Will Be Listed Below (If Applicable).     If you need a refill on your cardiac medications before your next appointment, please call your pharmacy.

## 2017-10-21 ENCOUNTER — Ambulatory Visit (HOSPITAL_COMMUNITY): Payer: Medicare Other | Attending: Cardiology

## 2017-10-21 ENCOUNTER — Other Ambulatory Visit: Payer: Self-pay

## 2017-10-21 DIAGNOSIS — E119 Type 2 diabetes mellitus without complications: Secondary | ICD-10-CM | POA: Diagnosis not present

## 2017-10-21 DIAGNOSIS — I4891 Unspecified atrial fibrillation: Secondary | ICD-10-CM | POA: Diagnosis present

## 2017-10-21 DIAGNOSIS — J42 Unspecified chronic bronchitis: Secondary | ICD-10-CM | POA: Insufficient documentation

## 2017-10-21 DIAGNOSIS — I119 Hypertensive heart disease without heart failure: Secondary | ICD-10-CM | POA: Insufficient documentation

## 2017-10-21 DIAGNOSIS — I480 Paroxysmal atrial fibrillation: Secondary | ICD-10-CM | POA: Diagnosis not present

## 2017-10-21 DIAGNOSIS — I2510 Atherosclerotic heart disease of native coronary artery without angina pectoris: Secondary | ICD-10-CM | POA: Insufficient documentation

## 2017-10-21 DIAGNOSIS — I428 Other cardiomyopathies: Secondary | ICD-10-CM | POA: Diagnosis not present

## 2017-10-21 DIAGNOSIS — E785 Hyperlipidemia, unspecified: Secondary | ICD-10-CM | POA: Diagnosis not present

## 2017-12-19 ENCOUNTER — Other Ambulatory Visit (HOSPITAL_COMMUNITY): Payer: Self-pay | Admitting: Nurse Practitioner

## 2017-12-28 ENCOUNTER — Other Ambulatory Visit: Payer: Self-pay | Admitting: Cardiovascular Disease

## 2018-03-25 ENCOUNTER — Encounter (HOSPITAL_COMMUNITY): Payer: Self-pay | Admitting: Nurse Practitioner

## 2018-03-25 ENCOUNTER — Ambulatory Visit (HOSPITAL_COMMUNITY)
Admission: RE | Admit: 2018-03-25 | Discharge: 2018-03-25 | Disposition: A | Discharge status: Home / Self Care | Payer: Medicare Other | Source: Ambulatory Visit | Attending: Nurse Practitioner | Admitting: Nurse Practitioner

## 2018-03-25 VITALS — BP 152/76 | HR 61 | Ht 70.0 in | Wt 241.0 lb

## 2018-03-25 DIAGNOSIS — G4733 Obstructive sleep apnea (adult) (pediatric): Secondary | ICD-10-CM | POA: Diagnosis not present

## 2018-03-25 DIAGNOSIS — Z888 Allergy status to other drugs, medicaments and biological substances status: Secondary | ICD-10-CM | POA: Diagnosis not present

## 2018-03-25 DIAGNOSIS — Z85820 Personal history of malignant melanoma of skin: Secondary | ICD-10-CM | POA: Diagnosis not present

## 2018-03-25 DIAGNOSIS — Z794 Long term (current) use of insulin: Secondary | ICD-10-CM | POA: Diagnosis not present

## 2018-03-25 DIAGNOSIS — Z87891 Personal history of nicotine dependence: Secondary | ICD-10-CM | POA: Diagnosis not present

## 2018-03-25 DIAGNOSIS — E785 Hyperlipidemia, unspecified: Secondary | ICD-10-CM | POA: Insufficient documentation

## 2018-03-25 DIAGNOSIS — I2510 Atherosclerotic heart disease of native coronary artery without angina pectoris: Secondary | ICD-10-CM | POA: Insufficient documentation

## 2018-03-25 DIAGNOSIS — I481 Persistent atrial fibrillation: Secondary | ICD-10-CM | POA: Diagnosis not present

## 2018-03-25 DIAGNOSIS — Z8546 Personal history of malignant neoplasm of prostate: Secondary | ICD-10-CM | POA: Insufficient documentation

## 2018-03-25 DIAGNOSIS — Z7901 Long term (current) use of anticoagulants: Secondary | ICD-10-CM | POA: Diagnosis not present

## 2018-03-25 DIAGNOSIS — I10 Essential (primary) hypertension: Secondary | ICD-10-CM | POA: Diagnosis not present

## 2018-03-25 DIAGNOSIS — Z79899 Other long term (current) drug therapy: Secondary | ICD-10-CM | POA: Diagnosis not present

## 2018-03-25 DIAGNOSIS — I4819 Other persistent atrial fibrillation: Secondary | ICD-10-CM

## 2018-03-25 DIAGNOSIS — E119 Type 2 diabetes mellitus without complications: Secondary | ICD-10-CM | POA: Insufficient documentation

## 2018-03-25 DIAGNOSIS — Z9989 Dependence on other enabling machines and devices: Secondary | ICD-10-CM | POA: Diagnosis not present

## 2018-03-25 NOTE — Progress Notes (Signed)
Primary Care Physician: Lucas Evens, MD Primary Cardiologist: Lucas Dudley Primary Electrophysiologist: Lucas Dudley is a 77 y.o. male with a history of persistent atrial fibrillation who presents for follow up in the [facility].  Since last being seen in clinic, the patient reports doing very well. He has only had 1 episode of AF that he knows of that lasted about 5 minutes. No sustained AF. No bleeding issues on [place].  His BP is elevated today. He does not check at home.  Today, he  denies symptoms of palpitations, chest pain, shortness of breath, orthopnea, PND, lower extremity edema, dizziness, presyncope, syncope, snoring, daytime somnolence, bleeding, or neurologic sequela. The patient is tolerating medications without difficulties and is otherwise without complaint today.    Atrial Fibrillation Risk Factors:  he does have symptoms or diagnosis of sleep apnea. he is compliant with CPAP therapy.  he does not have a history of rheumatic fever.  he does not have a history of alcohol use.  he has a BMI of Body mass index is 34.58 kg/m.[name] Filed Weights   03/25/18 1331  Weight: 241 lb (109.3 kg)    LA size: 40   Atrial Fibrillation Management history:  Previous antiarrhythmic drugs: Tikosyn  Previous cardioversions: 2016  Previous ablations: 2016  CHADS2VASC score: 5  Anticoagulation history: Eliquis   Past Medical History:  Diagnosis Date  . Arthritis    "hands probably" (05/04/2015)  . CAD (coronary artery disease)    a. Cath 03/17/15 showing 100% ostial D1, 50% prox LAD to mid LAD, 40% RPDA stenosis. Med rx.  . Glaucoma   . History of gout   . Hyperlipidemia   . Hypertension   . Kidney stones    "Dudley during prostate OR; still in there as far as I know" (05/04/2015)  . Melanoma of neck (Lucas Dudley)    "right; came back IV; no chemo"  . NICM (nonischemic cardiomyopathy) (Lucas Dudley)   . OSA on CPAP 2012  . Paroxysmal atrial fibrillation  (HCC)    a. 04/2015 Tikosyn loading, DCCV x 1;  b. CHA2DS2VASc = 5-->Eliquis.  . Prostate cancer (Lucas Dudley)   . Type II diabetes mellitus (Lucas Dudley)    Past Surgical History:  Procedure Laterality Date  . ABDOMINAL HERNIA REPAIR     w/mesh  . CARDIAC CATHETERIZATION N/A 03/17/2015   Procedure: Left Heart Cath and Coronary Angiography;  Surgeon: Lucas Harp, MD;  Location: [facility];  Service: Cardiovascular;  Laterality: N/A;  . carotid doppler  09/17/2008   rigt and left ICAs 0-49%;mildly  abnormal  . COLONOSCOPY N/A 05/16/2017   Procedure: COLONOSCOPY;  Surgeon: Lucas Houston, MD;  [facility];  Service: Endoscopy;  Laterality: N/A;  930  . DOPPLER ECHOCARDIOGRAPHY  05/25/2009   EF 50-55%,LA mildly dilated, LV function normal  . ELECTROPHYSIOLOGIC STUDY N/A 04/05/2015   Procedure: Cardioversion;  Surgeon: Lucas Klein, MD;  Location: [facility];  Service: Cardiovascular;  Laterality: N/A;  . ELECTROPHYSIOLOGIC STUDY N/A 09/06/2015   Procedure: Atrial Fibrillation Ablation;  Surgeon: Lucas Grayer, MD;  Location: [facility];  Service: Cardiovascular;  Laterality: N/A;  . ELECTROPHYSIOLOGIC STUDY N/A 07/12/2016   redo afib ablation by Dr Lucas Dudley  . EXCISIONAL HEMORRHOIDECTOMY     "inside and out"  . FINE NEEDLE ASPIRATION Right    knee; "drew ~ 1 quart off"  . HERNIA REPAIR    . LAPAROSCOPIC CHOLECYSTECTOMY    . MELANOMA  EXCISION Right    "neck"  . NM MYOCAR PERF WALL MOTION  02/21/2012   EF 61% ,EXERCISE 7 METS. exercise stopped due to wheezing and shortness of breathe  . POLYPECTOMY  05/16/2017   Procedure: POLYPECTOMY;  Surgeon: Lucas Houston, MD;  [facility];  Service: Endoscopy;;  colon  . PROSTATECTOMY    . SHOULDER OPEN ROTATOR CUFF REPAIR Right X 2  . TEE WITHOUT CARDIOVERSION N/A 09/05/2015   Procedure: TRANSESOPHAGEAL ECHOCARDIOGRAM (TEE);  Surgeon: Lucas Margarita, MD;  Location: [facility] ENDOSCOPY;  Service: Cardiovascular;   Laterality: N/A;    Current Outpatient Medications  Medication Sig Dispense Refill  . acetaminophen (TYLENOL) 650 MG CR tablet Take 650 mg by mouth at bedtime.    . Carboxymethylcellul-Glycerin (LUBRICATING EYE DROPS OP) Apply 1 drop to eye daily as needed (itching eyes).    . carvedilol (COREG) 12.5 MG tablet Take 0.5 tablets (6.25 mg total) by mouth 2 (two) times daily with a meal. 90 tablet 3  . diphenhydramine-acetaminophen (TYLENOL PM EXTRA STRENGTH) 25-500 MG TABS tablet Take 2 tablets by mouth at bedtime.    [name] ELIQUIS 5 MG TABS tablet TAKE 1 TABLET TWICE A DAY 180 tablet 0  . furosemide (LASIX) 20 MG tablet Take 20 mg by mouth daily.    [name] gemfibrozil (LOPID) 600 MG tablet Take 600 mg by mouth at bedtime.     [name] glimepiride (AMARYL) 2 MG tablet Take 2 mg by mouth 2 (two) times daily.     . Insulin Glargine (BASAGLAR KWIKPEN) 100 UNIT/ML SOPN Inject 40 Units into the skin at bedtime.     [name] latanoprost (XALATAN) 0.005 % ophthalmic solution Place 1 drop into both eyes at bedtime.    [name] lisinopril (PRINIVIL,ZESTRIL) 20 MG tablet Take 20 mg by mouth every morning.     . Melatonin 5 MG TABS Take 5 mg by mouth at bedtime.    . metFORMIN (GLUCOPHAGE) 500 MG tablet Take 1,000 mg by mouth 2 (two) times daily.     No current facility-administered medications for this encounter.     Allergies  Allergen Reactions  . Tape Rash and Other (See Comments)    Causes skin redness, Use paper tape only.    Social History   Socioeconomic History  . Marital status: Married    Spouse name: Not on file  . Number of children: Not on file  . Years of education: Not on file  . Highest education level: Not on file  Occupational History  . Occupation: Retired  Scientific laboratory technician  . Financial resource strain: Not on file  . Food insecurity:    Worry: Not on file    Inability: Not on file  . Transportation needs:    Medical: Not on file    Non-medical: Not on file  Tobacco Use  . Smoking status: Former  Smoker    Packs/day: 2.00    Years: 27.00    Pack years: 54.00    Types: Cigarettes    Last attempt to quit: 10/31/1983    Years since quitting: 34.4  . Smokeless tobacco: Never Used  Substance and Sexual Activity  . Alcohol use: No    Alcohol/week: 0.0 oz    Comment: "used to drink; stopped ~ 2008"  . Drug use: No  . Sexual activity: Not Currently  Lifestyle  . Physical activity:    Days per week: Not on file    Minutes per session: Not on file  . Stress: Not  on file  Relationships  . Social connections:    Talks on phone: Not on file    Gets together: Not on file    Attends religious service: Not on file    Active member of club or organization: Not on file    Attends meetings of clubs or organizations: Not on file    Relationship status: Not on file  . Intimate partner violence:    Fear of current or ex partner: Not on file    Emotionally abused: Not on file    Physically abused: Not on file    Forced sexual activity: Not on file  Other Topics Concern  . Not on file  Social History Narrative   Lives in [place], [place] with wife.    Family History  Problem Relation Age of Onset  . Heart disease Mother   . Lung cancer Mother   . Heart attack Mother 20  . Stroke Brother   . Healthy Daughter   . Diabetes Father   . Heart disease Father     ROS- All systems are reviewed and negative except as per the HPI above.  Physical Exam: Vitals:   03/25/18 1331  BP: (!) 152/76  Pulse: 61  Weight: 241 lb (109.3 kg)  Height: 5\' 10"  (1.778 m)    GEN- The patient is well appearing, alert and oriented x 3 today.   Head- normocephalic, atraumatic Eyes-  Sclera clear, conjunctiva pink Ears- hearing intact Oropharynx- clear Neck- supple  Lungs- Clear to ausculation bilaterally, normal work of breathing Heart- Regular rate and rhythm  GI- soft, NT, ND, + BS Extremities- no clubbing, cyanosis, or edema MS- no significant deformity or atrophy Skin- no rash or lesion Psych-  euthymic mood, full affect Neuro- strength and sensation are intact  Wt Readings from Last 3 Encounters:  03/25/18 241 lb (109.3 kg)  10/14/17 234 lb (106.1 kg)  05/16/17 235 lb (106.6 kg)    EKG today demonstrates sinus rhythm, rate 61  Epic records are reviewed at length today  Assessment and Plan:  1. Persistent atrial fibrillation Doing well off AAD therapy No sustained AF Continue Eliquis for CHADS2VASC of 5  2. HTN BP elevated in clinic today I have asked him to check his blood pressure at home for the next few days and call if SBP averaging >140  3. Obstructive sleep apnea The importance of adequate treatment of sleep apnea was discussed today in order to improve our ability to maintain sinus rhythm long term. The patient reports compliance with CPAP   Follow up with Dr Lucas Dudley in 6 months as scheduled   Chanetta Marshall, NP 03/25/2018 2:34 PM

## 2018-03-28 ENCOUNTER — Other Ambulatory Visit: Payer: Self-pay | Admitting: Cardiovascular Disease

## 2018-03-28 NOTE — Telephone Encounter (Signed)
Rx request sent to pharmacy.  

## 2018-06-25 ENCOUNTER — Other Ambulatory Visit: Payer: Self-pay

## 2018-06-25 MED ORDER — APIXABAN 5 MG PO TABS: 5.0000 mg | ORAL_TABLET | Freq: Two times a day (BID) | ORAL | 0 refills | Status: DC

## 2018-09-22 ENCOUNTER — Other Ambulatory Visit: Payer: Self-pay | Admitting: Cardiovascular Disease

## 2018-10-15 HISTORY — PX: EYE SURGERY: SHX253

## 2018-10-15 HISTORY — PX: CATARACT EXTRACTION: SUR2

## 2018-10-18 ENCOUNTER — Emergency Department (HOSPITAL_COMMUNITY): Payer: Medicare Other

## 2018-10-18 ENCOUNTER — Emergency Department (HOSPITAL_COMMUNITY)
Admission: EM | Admit: 2018-10-18 | Discharge: 2018-10-18 | Disposition: A | Discharge status: Home / Self Care | Payer: Medicare Other | Source: Home / Self Care | Attending: Emergency Medicine | Admitting: Emergency Medicine

## 2018-10-18 ENCOUNTER — Emergency Department (HOSPITAL_COMMUNITY)
Admission: EM | Admit: 2018-10-18 | Discharge: 2018-10-18 | Discharge status: Against Medical Advice | Payer: Medicare Other | Attending: Emergency Medicine | Admitting: Emergency Medicine

## 2018-10-18 ENCOUNTER — Encounter (HOSPITAL_COMMUNITY): Payer: Self-pay | Admitting: *Deleted

## 2018-10-18 ENCOUNTER — Other Ambulatory Visit: Payer: Self-pay

## 2018-10-18 ENCOUNTER — Encounter (HOSPITAL_COMMUNITY): Payer: Self-pay | Admitting: Emergency Medicine

## 2018-10-18 DIAGNOSIS — M60271 Foreign body granuloma of soft tissue, not elsewhere classified, right ankle and foot: Secondary | ICD-10-CM | POA: Diagnosis not present

## 2018-10-18 DIAGNOSIS — R0602 Shortness of breath: Secondary | ICD-10-CM | POA: Diagnosis not present

## 2018-10-18 DIAGNOSIS — Z79899 Other long term (current) drug therapy: Secondary | ICD-10-CM

## 2018-10-18 DIAGNOSIS — I5042 Chronic combined systolic (congestive) and diastolic (congestive) heart failure: Secondary | ICD-10-CM | POA: Diagnosis not present

## 2018-10-18 DIAGNOSIS — Z23 Encounter for immunization: Secondary | ICD-10-CM | POA: Insufficient documentation

## 2018-10-18 DIAGNOSIS — B349 Viral infection, unspecified: Secondary | ICD-10-CM | POA: Insufficient documentation

## 2018-10-18 DIAGNOSIS — Y929 Unspecified place or not applicable: Secondary | ICD-10-CM

## 2018-10-18 DIAGNOSIS — I110 Hypertensive heart disease with heart failure: Secondary | ICD-10-CM | POA: Insufficient documentation

## 2018-10-18 DIAGNOSIS — I2510 Atherosclerotic heart disease of native coronary artery without angina pectoris: Secondary | ICD-10-CM | POA: Diagnosis not present

## 2018-10-18 DIAGNOSIS — R4182 Altered mental status, unspecified: Secondary | ICD-10-CM

## 2018-10-18 DIAGNOSIS — Z87891 Personal history of nicotine dependence: Secondary | ICD-10-CM | POA: Insufficient documentation

## 2018-10-18 DIAGNOSIS — I480 Paroxysmal atrial fibrillation: Secondary | ICD-10-CM | POA: Diagnosis not present

## 2018-10-18 DIAGNOSIS — E119 Type 2 diabetes mellitus without complications: Secondary | ICD-10-CM | POA: Diagnosis not present

## 2018-10-18 DIAGNOSIS — Y999 Unspecified external cause status: Secondary | ICD-10-CM | POA: Insufficient documentation

## 2018-10-18 DIAGNOSIS — S90851A Superficial foreign body, right foot, initial encounter: Secondary | ICD-10-CM | POA: Insufficient documentation

## 2018-10-18 DIAGNOSIS — Z7901 Long term (current) use of anticoagulants: Secondary | ICD-10-CM

## 2018-10-18 DIAGNOSIS — W25XXXA Contact with sharp glass, initial encounter: Secondary | ICD-10-CM

## 2018-10-18 DIAGNOSIS — Z1881 Retained glass fragments: Secondary | ICD-10-CM | POA: Insufficient documentation

## 2018-10-18 DIAGNOSIS — Y939 Activity, unspecified: Secondary | ICD-10-CM

## 2018-10-18 DIAGNOSIS — Z794 Long term (current) use of insulin: Secondary | ICD-10-CM | POA: Insufficient documentation

## 2018-10-18 DIAGNOSIS — Z7984 Long term (current) use of oral hypoglycemic drugs: Secondary | ICD-10-CM | POA: Insufficient documentation

## 2018-10-18 DIAGNOSIS — R509 Fever, unspecified: Secondary | ICD-10-CM

## 2018-10-18 DIAGNOSIS — S90859A Superficial foreign body, unspecified foot, initial encounter: Secondary | ICD-10-CM

## 2018-10-18 LAB — RAPID URINE DRUG SCREEN, HOSP PERFORMED
Amphetamines: NOT DETECTED
Barbiturates: NOT DETECTED
Benzodiazepines: NOT DETECTED
Cocaine: NOT DETECTED
Opiates: NOT DETECTED
Tetrahydrocannabinol: NOT DETECTED

## 2018-10-18 LAB — CBC WITH DIFFERENTIAL/PLATELET
Abs Immature Granulocytes: 0.11 10*3/uL — ABNORMAL HIGH (ref 0.00–0.07)
Basophils Absolute: 0 10*3/uL (ref 0.0–0.1)
Basophils Relative: 0 %
Eosinophils Absolute: 0 10*3/uL (ref 0.0–0.5)
Eosinophils Relative: 0 %
HCT: 43.1 % (ref 39.0–52.0)
Hemoglobin: 14.3 g/dL (ref 13.0–17.0)
Immature Granulocytes: 1 %
Lymphocytes Relative: 3 %
Lymphs Abs: 0.6 10*3/uL — ABNORMAL LOW (ref 0.7–4.0)
MCH: 31.5 pg (ref 26.0–34.0)
MCHC: 33.2 g/dL (ref 30.0–36.0)
MCV: 94.9 fL (ref 80.0–100.0)
Monocytes Absolute: 1 10*3/uL (ref 0.1–1.0)
Monocytes Relative: 5 %
Neutro Abs: 16.9 10*3/uL — ABNORMAL HIGH (ref 1.7–7.7)
Neutrophils Relative %: 91 %
Platelets: 222 10*3/uL (ref 150–400)
RBC: 4.54 MIL/uL (ref 4.22–5.81)
RDW: 13.3 % (ref 11.5–15.5)
WBC: 18.6 10*3/uL — ABNORMAL HIGH (ref 4.0–10.5)
nRBC: 0 % (ref 0.0–0.2)

## 2018-10-18 LAB — COMPREHENSIVE METABOLIC PANEL
ALT: 17 U/L (ref 0–44)
ANION GAP: 12 (ref 5–15)
AST: 21 U/L (ref 15–41)
Albumin: 4.5 g/dL (ref 3.5–5.0)
Alkaline Phosphatase: 52 U/L (ref 38–126)
BUN: 24 mg/dL — ABNORMAL HIGH (ref 8–23)
CO2: 25 mmol/L (ref 22–32)
Calcium: 9.1 mg/dL (ref 8.9–10.3)
Chloride: 98 mmol/L (ref 98–111)
Creatinine, Ser: 1.01 mg/dL (ref 0.61–1.24)
GFR calc Af Amer: >60 mL/min (ref >60)
GFR calc non Af Amer: >60 mL/min (ref >60)
Glucose, Bld: 217 mg/dL — ABNORMAL HIGH (ref 70–99)
Potassium: 4.1 mmol/L (ref 3.5–5.1)
Sodium: 135 mmol/L (ref 135–145)
Total Bilirubin: 0.6 mg/dL (ref 0.3–1.2)
Total Protein: 8.2 g/dL — ABNORMAL HIGH (ref 6.5–8.1)

## 2018-10-18 LAB — I-STAT CHEM 8, ED
BUN: 23 mg/dL (ref 8–23)
Calcium, Ion: 1.1 mmol/L — ABNORMAL LOW (ref 1.15–1.40)
Chloride: 99 mmol/L (ref 98–111)
Creatinine, Ser: 0.9 mg/dL (ref 0.61–1.24)
Glucose, Bld: 213 mg/dL — ABNORMAL HIGH (ref 70–99)
HEMATOCRIT: 44 % (ref 39.0–52.0)
Hemoglobin: 15 g/dL (ref 13.0–17.0)
POTASSIUM: 4.2 mmol/L (ref 3.5–5.1)
Sodium: 136 mmol/L (ref 135–145)
TCO2: 28 mmol/L (ref 22–32)

## 2018-10-18 LAB — I-STAT TROPONIN, ED: Troponin i, poc: 0 ng/mL (ref 0.00–0.08)

## 2018-10-18 LAB — URINALYSIS, ROUTINE W REFLEX MICROSCOPIC
Bacteria, UA: NONE SEEN
Bilirubin Urine: NEGATIVE
Glucose, UA: 50 mg/dL — AB
Ketones, ur: NEGATIVE mg/dL
Leukocytes, UA: NEGATIVE
Nitrite: NEGATIVE
Protein, ur: 100 mg/dL — AB
Specific Gravity, Urine: 1.012 (ref 1.005–1.030)
pH: 5 (ref 5.0–8.0)

## 2018-10-18 LAB — I-STAT CG4 LACTIC ACID, ED
Lactic Acid, Venous: 3.1 mmol/L (ref 0.5–1.9)
Lactic Acid, Venous: 1.87 mmol/L (ref 0.5–1.9)

## 2018-10-18 LAB — INFLUENZA PANEL BY PCR (TYPE A & B)
Influenza A By PCR: NEGATIVE
Influenza B By PCR: NEGATIVE

## 2018-10-18 LAB — APTT: aPTT: 31 seconds (ref 24–36)

## 2018-10-18 LAB — ETHANOL: Alcohol, Ethyl (B): <10 mg/dL (ref <10)

## 2018-10-18 LAB — PROTIME-INR
INR: 1.25
PROTHROMBIN TIME: 15.6 s — AB (ref 11.4–15.2)

## 2018-10-18 MED ORDER — SODIUM CHLORIDE 0.9 % IV SOLN
1.0000 g | Freq: Once | INTRAVENOUS | Status: AC
  Administered 2018-10-18: 1 g via INTRAVENOUS
  Filled 2018-10-18: qty 10

## 2018-10-18 MED ORDER — SODIUM CHLORIDE 0.9 % IV SOLN
Freq: Once | INTRAVENOUS | Status: AC
  Administered 2018-10-18: 18:00:00 via INTRAVENOUS

## 2018-10-18 MED ORDER — SODIUM CHLORIDE 0.9 % IV BOLUS
1000.0000 mL | Freq: Once | INTRAVENOUS | Status: AC
  Administered 2018-10-18: 1000 mL via INTRAVENOUS

## 2018-10-18 MED ORDER — CLINDAMYCIN HCL 150 MG PO CAPS
300.0000 mg | ORAL_CAPSULE | Freq: Once | ORAL | Status: AC
  Administered 2018-10-18: 300 mg via ORAL
  Filled 2018-10-18: qty 2

## 2018-10-18 MED ORDER — LIDOCAINE-EPINEPHRINE (PF) 2 %-1:200000 IJ SOLN
10.0000 mL | Freq: Once | INTRAMUSCULAR | Status: AC
  Administered 2018-10-18: 10 mL
  Filled 2018-10-18: qty 10

## 2018-10-18 MED ORDER — TETANUS-DIPHTH-ACELL PERTUSSIS 5-2.5-18.5 LF-MCG/0.5 IM SUSP
0.5000 mL | Freq: Once | INTRAMUSCULAR | Status: AC
  Administered 2018-10-18: 0.5 mL via INTRAMUSCULAR
  Filled 2018-10-18: qty 0.5

## 2018-10-18 MED ORDER — CLINDAMYCIN HCL 150 MG PO CAPS: 300.0000 mg | ORAL_CAPSULE | Freq: Four times a day (QID) | ORAL | 0 refills | Status: DC

## 2018-10-18 MED ORDER — ACETAMINOPHEN 500 MG PO TABS
1000.0000 mg | ORAL_TABLET | Freq: Once | ORAL | Status: AC
  Administered 2018-10-18: 1000 mg via ORAL
  Filled 2018-10-18: qty 2

## 2018-10-18 NOTE — ED Triage Notes (Signed)
Pt reports he stepped on a piece of glass about 3 weeks ago. Pt c/o pain to bottom of right foot. Pt concerned a piece of glass is still stuck in his foot. Pt's last tetanus shot 5-10 years ago.

## 2018-10-18 NOTE — ED Notes (Signed)
Went into patient's room. Patient states "I want to leave NOW." Advised patient need for head CT. Patient states "I am NOT going to have a scan and I am going to leave NOW." Advised Dr Lacinda Axon. Dr Lacinda Axon came to bedside and spoke with patient. Patient agreeable to have CT but repeatedly states "there's nothing wrong with me and I want to go home." Patient to CT with transporter and nurse x 2 at side.

## 2018-10-18 NOTE — ED Notes (Signed)
Had help from visit and security to get pt in car.  Daughter called pts phone while trying to get pt in car.  Security accompanied me and I asked pt several times to stay.  Daughter told pt not leave.  Pt left sitting in his car and on the telephone with his daughter.

## 2018-10-18 NOTE — Discharge Instructions (Addendum)
Tests were good.  Increase fluids.  Tylenol for fever.  Can soak your foot in warm water.  There may be a small amount of bleeding.  Suture out in approximately 1 week.  Tylenol for pain.  Elevate foot tonight.

## 2018-10-18 NOTE — ED Provider Notes (Signed)
Patient reports [facility] Provider Note   CSN: [id number] Arrival date & time: 10/18/18  1515     History   Chief Complaint No chief complaint on file.   HPI Lucas Dudley is a 78 y.o. male.  Level 5 caveat for altered mental status.  Patient reports fever and chills earlier today with altered mental status, per family history.  He initially presented to the emergency department for removal of foreign body from his right foot.  His behavior deteriorated here including aggressive belligerent conversation.  No prodromal illnesses.  No cough, dysuria, stiff neck.     Past Medical History:  Diagnosis Date  . Arthritis    "hands probably" (05/04/2015)  . CAD (coronary artery disease)    a. Cath 03/17/15 showing 100% ostial D1, 50% prox LAD to mid LAD, 40% RPDA stenosis. Med rx.  . Glaucoma   . History of gout   . Hyperlipidemia   . Hypertension   . Kidney stones    "found during prostate OR; still in there as far as I know" (05/04/2015)  . Melanoma of neck ([place])    "right; came back IV; no chemo"  . NICM (nonischemic cardiomyopathy) ([place])   . OSA on CPAP 2012  . Paroxysmal atrial fibrillation (HCC)    a. 04/2015 Tikosyn loading, DCCV x 1;  b. CHA2DS2VASc = 5-->Eliquis.  . Prostate cancer ([place])   . Type II diabetes mellitus [facility])     Patient Active Problem List   Diagnosis Date Noted  . History of colonic polyps 01/22/2017  . Family hx of colorectal cancer 01/22/2017  . A-fib ([place]) 07/12/2016  . Acute on chronic combined systolic and diastolic CHF (congestive heart failure) ([place]) 05/17/2016  . CAD in native artery 05/17/2016  . PAF (paroxysmal atrial fibrillation) ([place]) 09/06/2015  . Chronic systolic dysfunction of left ventricle 07/02/2015  . Cardiomyopathy, dilated, nonischemic ([place]) 05/04/2015  . Severe obstructive sleep apnea 04/25/2015  . Persistent atrial fibrillation ([place]) 04/04/2015  . Diabetes mellitus type 2, noninsulin dependent ([place])   .  Chronic anticoagulation   . Abnormal stress test   . Shortness of breath 01/18/2015  . Paroxysmal atrial fibrillation ([place]) 07/06/2014  . Benign essential HTN 06/14/2014  . Calculus of kidney 06/14/2014  . Diabetes mellitus with no complication ([place]) 22/11/5425  . Right flank pain 06/14/2014  . Blood in the urine 06/14/2014  . CA of prostate ([place]) 06/14/2014  . 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/14/2014  . Hypercholesterolemia without hypertriglyceridemia 06/14/2014  . Hematochezia 10/30/2013  . Essential hypertension 07/10/2013  . Hyperlipidemia 07/10/2013    Past Surgical History:  Procedure Laterality Date  . ABDOMINAL HERNIA REPAIR     w/mesh  . CARDIAC CATHETERIZATION N/A 03/17/2015   Procedure: Left Heart Cath and Coronary Angiography;  Surgeon: Lorretta Harp, MD;  Location: [facility];  Service: Cardiovascular;  Laterality: N/A;  . carotid doppler  09/17/2008   rigt and left ICAs 0-49%;mildly  abnormal  . COLONOSCOPY N/A 05/16/2017   Procedure: COLONOSCOPY;  Surgeon: Rogene Houston, MD;  [facility];  Service: Endoscopy;  Laterality: N/A;  930  . DOPPLER ECHOCARDIOGRAPHY  05/25/2009   EF 50-55%,LA mildly dilated, LV function normal  . ELECTROPHYSIOLOGIC STUDY N/A 04/05/2015   Procedure: Cardioversion;  Surgeon: Sanda Klein, MD;  Location: [facility];  Service: Cardiovascular;  Laterality: N/A;  . ELECTROPHYSIOLOGIC STUDY N/A 09/06/2015   Procedure: Atrial Fibrillation Ablation;  Surgeon: Thompson Grayer,  MD;  Location: [facility];  Service: Cardiovascular;  Laterality: N/A;  . ELECTROPHYSIOLOGIC STUDY N/A 07/12/2016   red[facility] afib ablation by Dr Rayann Heman  . EXCISIONAL HEMORRHOIDECTOMY     "inside and out"  . FINE NEEDLE ASPIRATION Right    knee; "drew ~ 1 quart off"  . HERNIA REPAIR    . LAPAROSCOPIC CHOLECYSTECTOMY    . MELANOMA EXCISION Right    "neck"  . NM MYOCAR PERF WALL MOTION  02/21/2012   EF 61% ,EXERCISE 7 METS.  exercise stopped due to wheezing and shortness of breathe  . POLYPECTOMY  05/16/2017   Procedure: POLYPECTOMY;  Surgeon: Rogene Houston, MD;  [facility];  Service: Endoscopy;;  colon  . PROSTATECTOMY    . SHOULDER OPEN ROTATOR CUFF REPAIR Right X 2  . TEE WITHOUT CARDIOVERSION N/A 09/05/2015   Procedure: TRANSESOPHAGEAL ECHOCARDIOGRAM (TEE);  Surgeon: Sueanne Margarita, MD;  Location: [facility] ENDOSCOPY;  Service: Cardiovascular;  Laterality: N/A;        Home Medications    Prior to Admission medications   Medication Sig Start Date End Date Taking? Authorizing Provider  acetaminophen (TYLENOL) 650 MG CR tablet Take 650 mg by mouth at bedtime.    [provider]  Carboxymethylcellul-Glycerin (LUBRICATING EYE DROPS OP) Apply 1 drop to eye daily as needed (itching eyes).    [provider]  carvedilol (COREG) 12.5 MG tablet Take 0.5 tablets (6.25 mg total) by mouth 2 (two) times daily with a meal. 12/19/17   Sherran Needs, NP  clindamycin (CLEOCIN) 150 MG capsule Take 2 capsules (300 mg total) by mouth every 6 (six) hours. 10/18/18   Idol, Almyra Free, PA-C  diphenhydramine-acetaminophen (TYLENOL PM EXTRA STRENGTH) 25-500 MG TABS tablet Take 2 tablets by mouth at bedtime.    [provider]  ELIQUIS 5 MG TABS tablet TAKE 1 TABLET TWICE A DAY. MAKE AN APPOINTMENT FOR    FURTHER REFILLS 09/24/18   Lorretta Harp, MD  furosemide (LASIX) 20 MG tablet Take 20 mg by mouth daily.    [provider]  gemfibrozil (LOPID) 600 MG tablet Take 600 mg by mouth at bedtime.     [provider]  glimepiride (AMARYL) 2 MG tablet Take 2 mg by mouth 2 (two) times daily.     [provider]  Insulin Glargine (BASAGLAR KWIKPEN) 100 UNIT/ML SOPN Inject 40 Units into the skin at bedtime.     [provider]  latanoprost (XALATAN) 0.005 % ophthalmic solution Place 1 drop into both eyes at bedtime.    [provider]  lisinopril (PRINIVIL,ZESTRIL)  20 MG tablet Take 20 mg by mouth every morning.     [provider]  Melatonin 5 MG TABS Take 5 mg by mouth at bedtime.    [provider]  metFORMIN (GLUCOPHAGE) 500 MG tablet Take 1,000 mg by mouth 2 (two) times daily.    [provider]    Family History Family History  Problem Relation Age of Onset  . Heart disease Mother   . Lung cancer Mother   . Heart attack Mother 82  . Stroke Brother   . Healthy Daughter   . Diabetes Father   . Heart disease Father     Social History Social History   Tobacco Use  . Smoking status: Former Smoker    Packs/day: 2.00    Years: 27.00    Pack years: 54.00    Types: Cigarettes    Last attempt  to quit: 10/31/1983    Years since quitting: 34.9  . Smokeless tobacco: Never Used  Substance Use Topics  . Alcohol use: No    Alcohol/week: 0.0 standard drinks    Comment: "used to drink; stopped ~ 2008"  . Drug use: No     Allergies   Tape   Review of Systems Review of Systems  Unable to perform ROS: Mental status change     Physical Exam Updated Vital Signs BP (!) 149/72 (BP Location: Left Arm)   Pulse 86   Temp 98.3 F (36.8 C) (Oral)   Resp (!) 24   Ht 5' 10.5" (1.791 m)   Wt 109 kg   SpO2 94%   BMI 33.99 kg/m   Physical Exam Vitals signs and nursing note reviewed.  Constitutional:      Appearance: He is well-developed.     Comments: Confused, did not know place or date.  HENT:     Head: Normocephalic and atraumatic.  Eyes:     Conjunctiva/sclera: Conjunctivae normal.  Neck:     Musculoskeletal: Neck supple.  Cardiovascular:     Rate and Rhythm: Normal rate and regular rhythm.  Pulmonary:     Effort: Pulmonary effort is normal.     Breath sounds: Normal breath sounds.  Abdominal:     General: Bowel sounds are normal.     Palpations: Abdomen is soft.  Musculoskeletal: Normal range of motion.  Skin:    Comments: Right foot: Indurated papular area at the MTP joint area of the second  digit, plantar aspect  Neurological:     Comments: Moving all extremities appropriately.  Psychiatric:     Comments: Confused      ED Treatments / Results  Labs (all labs ordered are listed, but only abnormal results are displayed) Labs Reviewed  PROTIME-INR - Abnormal; Notable for the following components:      Result Value   Prothrombin Time 15.6 (*)    All other components within normal limits  COMPREHENSIVE METABOLIC PANEL - Abnormal; Notable for the following components:   Glucose, Bld 217 (*)    BUN 24 (*)    Total Protein 8.2 (*)    All other components within normal limits  URINALYSIS, ROUTINE W REFLEX MICROSCOPIC - Abnormal; Notable for the following components:   Glucose, UA 50 (*)    Hgb urine dipstick MODERATE (*)    Protein, ur 100 (*)    All other components within normal limits  CBC WITH DIFFERENTIAL/PLATELET - Abnormal; Notable for the following components:   WBC 18.6 (*)    Neutro Abs 16.9 (*)    Lymphs Abs 0.6 (*)    Abs Immature Granulocytes 0.11 (*)    All other components within normal limits  I-STAT CHEM 8, ED - Abnormal; Notable for the following components:   Glucose, Bld 213 (*)    Calcium, Ion 1.10 (*)    All other components within normal limits  I-STAT CG4 LACTIC ACID, ED - Abnormal; Notable for the following components:   Lactic Acid, Venous 3.10 (*)    All other components within normal limits  ETHANOL  APTT  RAPID URINE DRUG SCREEN, HOSP PERFORMED  INFLUENZA PANEL BY PCR (TYPE A & B)  I-STAT TROPONIN, ED  I-STAT CG4 LACTIC ACID, ED    EKG None  Radiology Dg Chest 2 View  Result Date: 10/18/2018 CLINICAL DATA:  Shortness of breath with exertion. EXAM: CHEST - 2 VIEW COMPARISON:  05/08/2016, 03/15/2015 and CT 07/10/2016 FINDINGS:  Lungs are adequately inflated with stable elevation of the right hemidiaphragm. No focal airspace consolidation or effusion. Moderate stable cardiomegaly with mild interval progression. Remainder of the exam  is unchanged. IMPRESSION: No acute cardiopulmonary disease. Moderate cardiomegaly with interval worsening. Pericardial effusion is possible. Electronically Signed   By: Marin Olp M.D.   On: 10/18/2018 13:44   Ct Head Wo Contrast  Result Date: 10/18/2018 CLINICAL DATA:  Acute onset altered mental status today. EXAM: CT HEAD WITHOUT CONTRAST TECHNIQUE: Contiguous axial images were obtained from the base of the skull through the vertex without intravenous contrast. COMPARISON:  None. FINDINGS: Brain: No evidence of acute infarction, hemorrhage, hydrocephalus, extra-axial collection or mass lesion/mass effect. Mild cortical atrophy and chronic microvascular ischemic change noted. Vascular: Atherosclerotic vascular disease is seen. Skull: Intact.  No focal lesion. Sinuses/Orbits: Negative. Other: None. IMPRESSION: No acute abnormality. Mild atrophy and chronic microvascular ischemic change. Atherosclerosis. Electronically Signed   By: Inge Rise M.D.   On: 10/18/2018 17:09   Dg Foot Complete Right  Result Date: 10/18/2018 CLINICAL DATA:  Right foot pain since the patient stepped on a piece of glass today. Question retained foreign body. Initial encounter. EXAM: RIGHT FOOT COMPLETE - 3+ VIEW COMPARISON:  None. FINDINGS: A radiopaque foreign body measuring 0.4 cm in diameter is seen in the shallow subcutaneous tissues on the plantar surface of the foot. The foreign body is at the level of the distal metaphysis of the proximal phalanx of the third toe. No fracture or dislocation. Scattered mild degenerative disease noted. IMPRESSION: 0.4 cm in diameter radiopaque foreign body consistent with classes in the shallow subcutaneous plantar soft tissues at the level of the neck of the proximal phalanx of the third toe. Electronically Signed   By: Inge Rise M.D.   On: 10/18/2018 13:42    Procedures .Marland KitchenIncision and Drainage Date/Time: 10/18/2018 7:00 PM Performed by: Nat Christen, MD Authorized by: Nat Christen, MD   Consent:    Consent obtained:  Verbal   Consent given by:  Patient and guardian   Risks discussed:  Pain   Alternatives discussed:  No treatment and delayed treatment Universal protocol:    Procedure explained and questions answered to patient or proxy's satisfaction: yes     Imaging studies available: yes     Patient identity confirmed:  Verbally with patient Location:    Indications for incision and drainage: piece of glass.   Size:  4 mm   Location:  Lower extremity   Lower extremity location:  Foot   Foot location:  R foot Pre-procedure details:    Skin preparation:  Betadine Anesthesia (see MAR for exact dosages):    Anesthesia method:  Local infiltration   Local anesthetic:  Lidocaine 2% WITH epi Procedure type:    Complexity:  Complex Procedure details:    Incision types:  Stab incision   Scalpel blade:  11   Wound management:  Probed and deloculated and irrigated with saline   Drainage:  Serous Post-procedure details:    Patient tolerance of procedure:  Tolerated well, no immediate complications Comments:     Piece of glass 4 x 4 mm extracted without complication.   (including critical care time)  Medications Ordered in ED Medications  sodium chloride 0.9 % bolus 1,000 mL (0 mLs Intravenous Stopped 10/18/18 1712)  cefTRIAXone (ROCEPHIN) 1 g in sodium chloride 0.9 % 100 mL IVPB (0 g Intravenous Stopped 10/18/18 1712)  acetaminophen (TYLENOL) tablet 1,000 mg (1,000 mg Oral Given 10/18/18 1602)  0.9 %  sodium chloride infusion ( Intravenous Stopped 10/18/18 1845)  lidocaine-EPINEPHrine (XYLOCAINE W/EPI) 2 %-1:200000 (PF) injection 10 mL (10 mLs Infiltration Given by Other 10/18/18 1931)     Initial Impression / Assessment and Plan / ED Course  I have reviewed the triage vital signs and the nursing notes.  Pertinent labs & imaging results that were available during my care of the patient were reviewed by me and considered in my medical decision making (see chart  for details).     Patient presents with 2 distinct problems.  His initial concern was a foreign body in his right foot.  However, as time progressed, he became febrile with chills and confusion.  Work-up in the ED including labs, urinalysis, chest x-ray, CT head all show no acute findings.  He responded well to IV fluids and Tylenol.  Foreign body extracted via examiner.  Disc all test results with patient and his family.  Final Clinical Impressions(s) / ED Diagnoses   Final diagnoses:  Fever, unspecified fever cause  Altered mental status, unspecified altered mental status type  Viral syndrome  Foreign body in right foot, initial encounter    ED Discharge Orders    None       Nat Christen, MD 10/18/18 1958

## 2018-10-18 NOTE — ED Notes (Signed)
Last tetanus unknown

## 2018-10-18 NOTE — ED Notes (Signed)
Family with pt and they are refusing a Air cabin crew at this time.

## 2018-10-18 NOTE — ED Notes (Signed)
Pt states he wants to leave. RN notified.

## 2018-10-18 NOTE — ED Notes (Signed)
Lactic 3.10 I stat  Dr Lacinda Axon informed   NS 1 L to room

## 2018-10-18 NOTE — ED Notes (Signed)
Attempted several times to get patient to complete care needed.  Pt refuses to stay.  Pt says he, " I gotta get out of here."

## 2018-10-18 NOTE — ED Triage Notes (Signed)
Pt was here earlier for a foreign body in foot and left AMA.  Pt made it to his vehicle and became confused and could not leave.  Was removed from the vehicle by staff and police.  Pt was not altered on his first visit and was very compliant and oriented.

## 2018-10-18 NOTE — Discharge Instructions (Addendum)
As discussed, because you are on a blood thinner and because this foreign body has been in your foot for several weeks it would not be wise to try to remove this in the emergency department today but would be better treated by a foot specialist.  Call Dr. Caprice Beaver on Monday morning for an appointment early this week for further evaluation of this foreign body.  Also as discussed, you are short of breath today and I recommend further testing today given the symptom and your chills.  You are refusing this further evaluation at this time.  Be advised that if your symptoms persist or worsen you should return here immediately for further evaluation of your symptoms.  Start the antibiotics to help minimize infection in your foot surrounding this foreign body.  You have been given your first dose here, take 2 more doses of this antibiotic today.

## 2018-10-18 NOTE — ED Provider Notes (Signed)
[facility] EMERGENCY DEPARTMENT Provider Note   CSN: [id number] Arrival date & time: 10/18/18  1237     History   Chief Complaint Chief Complaint  Patient presents with  . Foot Injury    HPI Lucas Dudley is a 78 y.o. male with a history as outlined below, most significant for CAD, hypertension, type 2 diabetes, paroxysmal atrial fibrillation and cardiomyopathy presenting for evaluation of a suspected glass foreign body in his right foot.  He states he stepped on glass approximately 3 weeks ago and has persistent discomfort in the foot and is concerned that there may be a piece of glass embedded.  He additionally states that he has increased shortness of breath, worse since he woke this morning and states he is scheduled to see his PCP 2 days from now and is not interested in evaluation of this symptom at this time.  He has felt cold since waking this morning, denies any fevers or other complaints including chest pain, productive cough, wheezing, abdominal pain, nausea or vomiting.  He presents to the ED incontinent of urine, he reports this is a chronic condition since he "has no prostate" but chooses not to wear depends.  He does not check his CBGs routinely, states his A1c's are in the 7 range.  He denies drainage or swelling from the affected foot.  He has had no treatments prior to arrival for this foot complaint.  HPI  Past Medical History:  Diagnosis Date  . Arthritis    "hands probably" (05/04/2015)  . CAD (coronary artery disease)    a. Cath 03/17/15 showing 100% ostial D1, 50% prox LAD to mid LAD, 40% RPDA stenosis. Med rx.  . Glaucoma   . History of gout   . Hyperlipidemia   . Hypertension   . Kidney stones    "found during prostate OR; still in there as far as I know" (05/04/2015)  . Melanoma of neck ([place])    "right; came back IV; no chemo"  . NICM (nonischemic cardiomyopathy) ([place])   . OSA on CPAP 2012  . Paroxysmal atrial fibrillation (HCC)    a. 04/2015 Tikosyn  loading, DCCV x 1;  b. CHA2DS2VASc = 5-->Eliquis.  . Prostate cancer ([place])   . Type II diabetes mellitus [place])     Patient Active Problem List   Diagnosis Date Noted  . History of colonic polyps 01/22/2017  . Family hx of colorectal cancer 01/22/2017  . A-fib ([place]) 07/12/2016  . Acute on chronic combined systolic and diastolic CHF (congestive heart failure) ([place]) 05/17/2016  . CAD in native artery 05/17/2016  . PAF (paroxysmal atrial fibrillation) ([place]) 09/06/2015  . Chronic systolic dysfunction of left ventricle 07/02/2015  . Cardiomyopathy, dilated, nonischemic ([place]) 05/04/2015  . Severe obstructive sleep apnea 04/25/2015  . Persistent atrial fibrillation ([place]) 04/04/2015  . Diabetes mellitus type 2, noninsulin dependent ([place])   . Chronic anticoagulation   . Abnormal stress test   . Shortness of breath 01/18/2015  . Paroxysmal atrial fibrillation ([place]) 07/06/2014  . Benign essential HTN 06/14/2014  . Calculus of kidney 06/14/2014  . Diabetes mellitus with no complication ([place]) 64/40/3474  . Right flank pain 06/14/2014  . Blood in the urine 06/14/2014  . CA of prostate ([place]) 06/14/2014  . 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/14/2014  . Hypercholesterolemia without hypertriglyceridemia 06/14/2014  . Hematochezia 10/30/2013  . Essential hypertension 07/10/2013  . Hyperlipidemia 07/10/2013    Past Surgical History:  Procedure Laterality Date  . ABDOMINAL HERNIA  REPAIR     w/mesh  . CARDIAC CATHETERIZATION N/A 03/17/2015   Procedure: Left Heart Cath and Coronary Angiography;  Surgeon: Lorretta Harp, MD;  Location: [place] CV LAB;  Service: Cardiovascular;  Laterality: N/A;  . carotid doppler  09/17/2008   rigt and left ICAs 0-49%;mildly  abnormal  . COLONOSCOPY N/A 05/16/2017   Procedure: COLONOSCOPY;  Surgeon: Rogene Houston, MD;  [facility];  Service: Endoscopy;  Laterality: N/A;  930  . DOPPLER ECHOCARDIOGRAPHY  05/25/2009   EF 50-55%,LA  mildly dilated, LV function normal  . ELECTROPHYSIOLOGIC STUDY N/A 04/05/2015   Procedure: Cardioversion;  Surgeon: Sanda Klein, MD;  Location: [place] CV LAB;  Service: Cardiovascular;  Laterality: N/A;  . ELECTROPHYSIOLOGIC STUDY N/A 09/06/2015   Procedure: Atrial Fibrillation Ablation;  Surgeon: Thompson Grayer, MD;  Location: [place] CV LAB;  Service: Cardiovascular;  Laterality: N/A;  . ELECTROPHYSIOLOGIC STUDY N/A 07/12/2016   redo afib ablation by Dr Rayann Heman  . EXCISIONAL HEMORRHOIDECTOMY     "inside and out"  . FINE NEEDLE ASPIRATION Right    knee; "drew ~ 1 quart off"  . HERNIA REPAIR    . LAPAROSCOPIC CHOLECYSTECTOMY    . MELANOMA EXCISION Right    "neck"  . NM MYOCAR PERF WALL MOTION  02/21/2012   EF 61% ,EXERCISE 7 METS. exercise stopped due to wheezing and shortness of breathe  . POLYPECTOMY  05/16/2017   Procedure: POLYPECTOMY;  Surgeon: Rogene Houston, MD;  [facility];  Service: Endoscopy;;  colon  . PROSTATECTOMY    . SHOULDER OPEN ROTATOR CUFF REPAIR Right X 2  . TEE WITHOUT CARDIOVERSION N/A 09/05/2015   Procedure: TRANSESOPHAGEAL ECHOCARDIOGRAM (TEE);  Surgeon: Sueanne Margarita, MD;  Location: [facility] ENDOSCOPY;  Service: Cardiovascular;  Laterality: N/A;        Home Medications    Prior to Admission medications   Medication Sig Start Date End Date Taking? Authorizing Provider  acetaminophen (TYLENOL) 650 MG CR tablet Take 650 mg by mouth at bedtime.    [provider]  Carboxymethylcellul-Glycerin (LUBRICATING EYE DROPS OP) Apply 1 drop to eye daily as needed (itching eyes).    [provider]  carvedilol (COREG) 12.5 MG tablet Take 0.5 tablets (6.25 mg total) by mouth 2 (two) times daily with a meal. 12/19/17   Sherran Needs, NP  clindamycin (CLEOCIN) 150 MG capsule Take 2 capsules (300 mg total) by mouth every 6 (six) hours. 10/18/18   Rifky Lapre, Almyra Free, PA-C  diphenhydramine-acetaminophen (TYLENOL PM EXTRA STRENGTH) 25-500 MG TABS  tablet Take 2 tablets by mouth at bedtime.    [provider]  ELIQUIS 5 MG TABS tablet TAKE 1 TABLET TWICE A DAY. MAKE AN APPOINTMENT FOR    FURTHER REFILLS 09/24/18   Lorretta Harp, MD  furosemide (LASIX) 20 MG tablet Take 20 mg by mouth daily.    [provider]  gemfibrozil (LOPID) 600 MG tablet Take 600 mg by mouth at bedtime.     [provider]  glimepiride (AMARYL) 2 MG tablet Take 2 mg by mouth 2 (two) times daily.     [provider]  Insulin Glargine (BASAGLAR KWIKPEN) 100 UNIT/ML SOPN Inject 40 Units into the skin at bedtime.     [provider]  latanoprost (XALATAN) 0.005 % ophthalmic solution Place 1 drop into both eyes at bedtime.    [provider]  lisinopril (PRINIVIL,ZESTRIL) 20 MG tablet Take 20 mg by mouth every morning.  [provider]  Melatonin 5 MG TABS Take 5 mg by mouth at bedtime.    [provider]  metFORMIN (GLUCOPHAGE) 500 MG tablet Take 1,000 mg by mouth 2 (two) times daily.    [provider]    Family History Family History  Problem Relation Age of Onset  . Heart disease Mother   . Lung cancer Mother   . Heart attack Mother 74  . Stroke Brother   . Healthy Daughter   . Diabetes Father   . Heart disease Father     Social History Social History   Tobacco Use  . Smoking status: Former Smoker    Packs/day: 2.00    Years: 27.00    Pack years: 54.00    Types: Cigarettes    Last attempt to quit: 10/31/1983    Years since quitting: 34.9  . Smokeless tobacco: Never Used  Substance Use Topics  . Alcohol use: No    Alcohol/week: 0.0 standard drinks    Comment: "used to drink; stopped ~ 2008"  . Drug use: No     Allergies   Tape   Review of Systems Review of Systems  Constitutional: Positive for chills. Negative for fever.  Respiratory: Positive for shortness of breath.   Cardiovascular: Negative for chest pain.  Gastrointestinal: Negative for abdominal  pain, nausea and vomiting.  Musculoskeletal: Positive for arthralgias. Negative for joint swelling and myalgias.  Skin: Positive for wound.  Neurological: Negative for weakness and numbness.     Physical Exam Updated Vital Signs BP (!) 165/71   Pulse 84   Temp 98.2 F (36.8 C) (Oral)   Resp 16   Ht 5' 10.5" (1.791 m)   Wt 108.9 kg   SpO2 91%   BMI 33.95 kg/m   Physical Exam Vitals signs and nursing note reviewed.  Constitutional:      Appearance: He is well-developed. He is obese.  HENT:     Head: Normocephalic and atraumatic.  Cardiovascular:     Rate and Rhythm: Normal rate. Rhythm irregular.     Pulses:          Dorsalis pedis pulses are 2+ on the right side.  Musculoskeletal: Normal range of motion.        General: Swelling and tenderness present.  Skin:    General: Skin is warm and dry.     Comments: Indurated callused lesion of patient's right plantar foot between the third and fourth metatarsal heads.  Suspected foreign body, no erythema or drainage from the site.  No obvious visible FB.  Neurological:     Mental Status: He is alert.  Psychiatric:        Mood and Affect: Mood is anxious.        Speech: Speech normal.      ED Treatments / Results  Labs (all labs ordered are listed, but only abnormal results are displayed) Labs Reviewed - No data to display  EKG None  Radiology Dg Chest 2 View  Result Date: 10/18/2018 CLINICAL DATA:  Shortness of breath with exertion. EXAM: CHEST - 2 VIEW COMPARISON:  05/08/2016, 03/15/2015 and CT 07/10/2016 FINDINGS: Lungs are adequately inflated with stable elevation of the right hemidiaphragm. No focal airspace consolidation or effusion. Moderate stable cardiomegaly with mild interval progression. Remainder of the exam is unchanged. IMPRESSION: No acute cardiopulmonary disease. Moderate cardiomegaly with interval worsening. Pericardial effusion is possible. Electronically Signed   By: Marin Olp M.D.   On: 10/18/2018  13:44   Dg  Foot Complete Right  Result Date: 10/18/2018 CLINICAL DATA:  Right foot pain since the patient stepped on a piece of glass today. Question retained foreign body. Initial encounter. EXAM: RIGHT FOOT COMPLETE - 3+ VIEW COMPARISON:  None. FINDINGS: A radiopaque foreign body measuring 0.4 cm in diameter is seen in the shallow subcutaneous tissues on the plantar surface of the foot. The foreign body is at the level of the distal metaphysis of the proximal phalanx of the third toe. No fracture or dislocation. Scattered mild degenerative disease noted. IMPRESSION: 0.4 cm in diameter radiopaque foreign body consistent with classes in the shallow subcutaneous plantar soft tissues at the level of the neck of the proximal phalanx of the third toe. Electronically Signed   By: Inge Rise M.D.   On: 10/18/2018 13:42    Procedures Procedures (including critical care time)  Medications Ordered in ED Medications  Tdap (BOOSTRIX) injection 0.5 mL (0.5 mLs Intramuscular Given 10/18/18 1354)  clindamycin (CLEOCIN) capsule 300 mg (300 mg Oral Given 10/18/18 1420)     Initial Impression / Assessment and Plan / ED Course  I have reviewed the triage vital signs and the nursing notes.  Pertinent labs & imaging results that were available during my care of the patient were reviewed by me and considered in my medical decision making (see chart for details).     Patient with suspected glass foreign body in his right foot which has been there for a suspected 3 weeks.  He is on Xarelto.  There is no obvious infection at the site, however given his Xarelto status and the duration this foreign body has been in place, we felt it was best that a podiatrist retrieve this foreign body.  He was referred to Dr. Caprice Beaver for this.  He was placed on clindamycin to help prevent infection, first dose was given here.  Given patient's shortness of breath and his chills which he woke with this morning, it was felt patient  needed further evaluation of the symptoms.  Patient refused any further tests, although we were able to get a chest x-ray ruling out pneumonia, there was question of increased cardiomegaly on today's x-ray.  This was discussed with the patient including discussion of further test that he should have before he safely returns home.  Patient adamantly refuses any further tests.  He states that his wife is currently with friends, but is ill and he needs to pick her up and take care of her.  He refuses to stay but states he will see his PCP in 2 days.  Patient was signed out AMA, he was argumentative during this visit, upset that we would not remove the glass from his foot, but was otherwise alert and seemed capable of making the decision to leave.  He was advised to return at any time he feels like his symptoms are worsening.   Patient was seen by Dr. Lita Mains during this ED visit.  Final Clinical Impressions(s) / ED Diagnoses   Final diagnoses:  Foreign body in foot, initial encounter  Shortness of breath    ED Discharge Orders         Ordered    clindamycin (CLEOCIN) 150 MG capsule  Every 6 hours     10/18/18 1415           Evalee Jefferson, PA-C 10/18/18 1525    Julianne Rice, MD 10/20/18 1740

## 2018-10-18 NOTE — ED Notes (Signed)
pts family came out in hall and asked for help, pt attempting to get out of bed, me and a RN assisted pt back up in bed. O2 sats normal, sweating and increased respirations noticed, EKG captured at that time, pt urinated on himself, we cleaned him up and placed a condom catheter. Pt now resting in bed, family at bedside.

## 2018-10-18 NOTE — ED Notes (Signed)
Pt is SOB and incontinent of urine.  Pt SOB when flat in bed and on stretcher at this time.  Pt says he has a history of A-Fib and is scheduled to see PCP on Monday.  BP 169/76 HR 84, Sats 91-93% on RA.  HOB at 90 degrees.  Evalee Jefferson, PA informed.

## 2018-10-18 NOTE — ED Notes (Signed)
Patient given meal tray as requested and approved by Dr Lacinda Axon.

## 2018-10-20 ENCOUNTER — Ambulatory Visit: Payer: Medicare Other | Admitting: Internal Medicine

## 2018-10-20 ENCOUNTER — Encounter: Payer: Self-pay | Admitting: Internal Medicine

## 2018-10-20 VITALS — BP 138/80 | HR 98 | Ht 70.5 in | Wt 239.6 lb

## 2018-10-20 DIAGNOSIS — I10 Essential (primary) hypertension: Secondary | ICD-10-CM | POA: Diagnosis not present

## 2018-10-20 DIAGNOSIS — I420 Dilated cardiomyopathy: Secondary | ICD-10-CM | POA: Diagnosis not present

## 2018-10-20 DIAGNOSIS — I4819 Other persistent atrial fibrillation: Secondary | ICD-10-CM | POA: Diagnosis not present

## 2018-10-20 DIAGNOSIS — G4733 Obstructive sleep apnea (adult) (pediatric): Secondary | ICD-10-CM

## 2018-10-20 DIAGNOSIS — Z9989 Dependence on other enabling machines and devices: Secondary | ICD-10-CM

## 2018-10-20 DIAGNOSIS — I2510 Atherosclerotic heart disease of native coronary artery without angina pectoris: Secondary | ICD-10-CM

## 2018-10-20 LAB — CBG MONITORING, ED: Glucose-Capillary: 214 mg/dL — ABNORMAL HIGH (ref 70–99)

## 2018-10-20 NOTE — Progress Notes (Signed)
PCP: [name], MD Primary Cardiologist: previously Dr Gwenlyn Found Primary EP: Dr Genevie Ann is a 78 y.o. male who presents today for routine electrophysiology followup.  Since last being seen in our clinic, the patient reports doing reasonably well.  He did recently have issues with glass in his foot and developing AMS while in the ED.  This has resolved.  No symptoms of afib.  Today, he denies symptoms of palpitations, chest pain, shortness of breath,  lower extremity edema, dizziness, presyncope, or syncope.  The patient is otherwise without complaint today.   Past Medical History:  Diagnosis Date  . Arthritis    "hands probably" (05/04/2015)  . CAD (coronary artery disease)    a. Cath 03/17/15 showing 100% ostial D1, 50% prox LAD to mid LAD, 40% RPDA stenosis. Med rx.  . Glaucoma   . History of gout   . Hyperlipidemia   . Hypertension   . Kidney stones    "found during prostate OR; still in there as far as I know" (05/04/2015)  . Melanoma of neck ([place])    "right; came back IV; no chemo"  . NICM (nonischemic cardiomyopathy) ([place])   . OSA on CPAP 2012  . Paroxysmal atrial fibrillation (HCC)    a. 04/2015 Tikosyn loading, DCCV x 1;  b. CHA2DS2VASc = 5-->Eliquis.  . Prostate cancer ([place])   . Type II diabetes mellitus ([place])    Past Surgical History:  Procedure Laterality Date  . ABDOMINAL HERNIA REPAIR     w/mesh  . CARDIAC CATHETERIZATION N/A 03/17/2015   Procedure: Left Heart Cath and Coronary Angiography;  Surgeon: Lorretta Harp, MD;  Location: [place] CV LAB;  Service: Cardiovascular;  Laterality: N/A;  . carotid doppler  09/17/2008   rigt and left ICAs 0-49%;mildly  abnormal  . COLONOSCOPY N/A 05/16/2017   Procedure: COLONOSCOPY;  Surgeon: Rogene Houston, MD;  [facility];  Service: Endoscopy;  Laterality: N/A;  930  . DOPPLER ECHOCARDIOGRAPHY  05/25/2009   EF 50-55%,LA mildly dilated, LV function normal  . ELECTROPHYSIOLOGIC STUDY N/A 04/05/2015     Procedure: Cardioversion;  Surgeon: Sanda Klein, MD;  Location: [place] [facility] LAB;  Service: Cardiovascular;  Laterality: N/A;  . ELECTROPHYSIOLOGIC STUDY N/A 09/06/2015   Procedure: Atrial Fibrillation Ablation;  Surgeon: Thompson Grayer, MD;  Location: [place] CV LAB;  Service: Cardiovascular;  Laterality: N/A;  . ELECTROPHYSIOLOGIC STUDY N/A 07/12/2016   redo afib ablation by Dr Rayann Heman  . EXCISIONAL HEMORRHOIDECTOMY     "inside and out"  . FINE NEEDLE ASPIRATION Right    knee; "drew ~ 1 quart off"  . HERNIA REPAIR    . LAPAROSCOPIC CHOLECYSTECTOMY    . MELANOMA EXCISION Right    "neck"  . NM MYOCAR PERF WALL MOTION  02/21/2012   EF 61% ,EXERCISE 7 METS. exercise stopped due to wheezing and shortness of breathe  . POLYPECTOMY  05/16/2017   Procedure: POLYPECTOMY;  Surgeon: Rogene Houston, MD;  [facility];  Service: Endoscopy;;  colon  . PROSTATECTOMY    . SHOULDER OPEN ROTATOR CUFF REPAIR Right X 2  . TEE WITHOUT CARDIOVERSION N/A 09/05/2015   Procedure: TRANSESOPHAGEAL ECHOCARDIOGRAM (TEE);  Surgeon: Sueanne Margarita, MD;  Location: [facility] ENDOSCOPY;  Service: Cardiovascular;  Laterality: N/A;    ROS- all systems are reviewed and negatives except as per HPI above  Current Outpatient Medications  Medication Sig Dispense Refill  . acetaminophen (TYLENOL) 650 MG CR tablet Take  650 mg by mouth at bedtime.    . Carboxymethylcellul-Glycerin (LUBRICATING EYE DROPS OP) Apply 1 drop to eye daily as needed (itching eyes).    . carvedilol (COREG) 12.5 MG tablet Take 0.5 tablets (6.25 mg total) by mouth 2 (two) times daily with a meal. 90 tablet 3  . clindamycin (CLEOCIN) 150 MG capsule Take 2 capsules (300 mg total) by mouth every 6 (six) hours. 42 capsule 0  . diphenhydramine-acetaminophen (TYLENOL PM EXTRA STRENGTH) 25-500 MG TABS tablet Take 2 tablets by mouth at bedtime.    [name] ELIQUIS 5 MG TABS tablet TAKE 1 TABLET TWICE A DAY. MAKE AN APPOINTMENT FOR    FURTHER REFILLS 180  tablet 0  . furosemide (LASIX) 20 MG tablet Take 20 mg by mouth daily.    [name] gemfibrozil (LOPID) 600 MG tablet Take 600 mg by mouth at bedtime.     [name] glimepiride (AMARYL) 2 MG tablet Take 2 mg by mouth 2 (two) times daily.     . Insulin Glargine (BASAGLAR KWIKPEN) 100 UNIT/ML SOPN Inject 40 Units into the skin at bedtime.     [name] latanoprost (XALATAN) 0.005 % ophthalmic solution Place 1 drop into both eyes at bedtime.    [name] lisinopril (PRINIVIL,ZESTRIL) 20 MG tablet Take 20 mg by mouth every morning.     . Melatonin 5 MG TABS Take 5 mg by mouth at bedtime.    . metFORMIN (GLUCOPHAGE) 500 MG tablet Take 1,000 mg by mouth 2 (two) times daily.     No current facility-administered medications for this visit.     Physical Exam: Vitals:   10/20/18 1152  BP: 138/80  Pulse: 98  SpO2: 98%  Weight: 239 lb 9.6 oz (108.7 kg)  Height: 5' 10.5" (1.791 m)    GEN- The patient is well appearing, alert and oriented x 3 today.   Head- normocephalic, atraumatic Eyes-  Sclera clear, conjunctiva pink Ears- hearing intact Oropharynx- clear Lungs- Clear to ausculation bilaterally, normal work of breathing Heart- Regular rate and rhythm, no murmurs, rubs or gallops, PMI not laterally displaced GI- soft, NT, ND, + BS Extremities- no clubbing, cyanosis, or edema  Wt Readings from Last 3 Encounters:  10/20/18 239 lb 9.6 oz (108.7 kg)  10/18/18 240 lb 4.8 oz (109 kg)  10/18/18 240 lb (108.9 kg)   Echo 10/21/17 reveals EF 60% (previous tachy mediated CM resolved)  EKG tracing ordered today is personally reviewed and shows sinus rhythm 55 bpm, PR 218 msec, QRS 104 msec, Qtc 430 msec, LAD  Assessment and Plan:  1. Persistent afib Maintaining sinus rhythm off AAD therapy post ablation Continue anticoagulation  2. HTN Stable No change required today  3. CAD No ischemic symptoms Stable No change required today  4. OSA Compliant with CPAP  AF clinic ever year Follow-up with general cardiology is  overdue.  He requests to switch to Dr Meda Coffee (who sees his wife).  His daughter reports that given his age, it would be easier if they saw cardiologists in the same practice. I will see when needed  Thompson Grayer MD, [facility] 10/20/2018 12:11 PM

## 2018-10-20 NOTE — Patient Instructions (Addendum)
Medication Instructions:  Your physician recommends that you continue on your current medications as directed. Please refer to the Current Medication list given to you today.  Labwork: None ordered.  Testing/Procedures: None ordered.  Follow-Up: Your physician wants you to follow-up in: 6 months with Dr. Meda Coffee.  You will receive a recall letter.   Your physician wants you to follow-up in: 12 months with Roderic Palau NP at the afib clinic.   You will receive a reminder letter in the mail two months in advance. If you don't receive a letter, please call our office to schedule the follow-up appointment.  Any Other Special Instructions Will Be Listed Below (If Applicable).  If you need a refill on your cardiac medications before your next appointment, please call your pharmacy.

## 2018-11-11 ENCOUNTER — Telehealth: Payer: Self-pay | Admitting: *Deleted

## 2018-11-11 NOTE — Telephone Encounter (Signed)
   [facility] Pre-operative Risk Assessment    Request for surgical clearance:  1. What type of surgery is being performed? CATARACT REMOVAL W/INTRAOCULAR   2. When is this surgery scheduled? 11/19/18   3. What type of clearance is required (medical clearance vs. Pharmacy clearance to hold med vs. Both)? BOTH  4. Are there any medications that need to be held prior to surgery and how long? HOLD ELIQUIS X 2 DAYS PRIOR   5. Practice name and name of physician performing surgery? [facility] DR. Harrell Gave WEAVER,   6. What is your office phone number [phone]    7.   What is your office fax number [phone]  8.   Anesthesia type (None, local, MAC, general) ? LEFT MESSAGE FOR TYPE OF ANESTHESIA    Julaine Hua 11/11/2018, 10:37 AM  _________________________________________________________________   (provider comments below)

## 2018-11-11 NOTE — Telephone Encounter (Signed)
   Primary Cardiologist: Quay Burow, MD; Thompson Grayer, MD   Chart reviewed as part of pre-operative protocol coverage. Cataract extractions are recognized in guidelines as low risk surgeries that do not typically require specific preoperative testing or holding of blood thinner therapy. Therefore, given past medical history and time since last visit, based on ACC/AHA guidelines, Lucas Dudley would be at acceptable risk for the planned procedure without further cardiovascular testing.   Per pharmacy recommendations, patient may hold xarelto 1 day prior to his upcoming cataract surgery. Patient should restart xarelto when cleared to do so by Dr. Kathlen Mody.   I will route this recommendation to the requesting party via Epic fax function and remove from pre-op pool.  Please call with questions.  Abigail Butts, PA-C 11/11/2018, 1:07 PM

## 2018-11-11 NOTE — Telephone Encounter (Signed)
Lucas Dudley from Dr. Kathlen Mody returned your call. She stated the type of Anesthesia that is going to be used is local and only pharmacy clearance is needed.

## 2018-11-11 NOTE — Telephone Encounter (Signed)
Surgeon office called back and said anesthesia will be local. Also states they just need pharmacy clearance.

## 2018-11-11 NOTE — Telephone Encounter (Signed)
Patient with diagnosis of Afib on Eliquis for anticoagulation.    Procedure: cataract removal with intraocular lens replacement  Date of procedure: 11/19/18  CHADS2-VASc score of  6 (CHF, HTN, AGE, DM2, stroke/tia x 2, CAD, AGE, male)  CrCl 25ml/min  Per office protocol, patient can hold Eliquis for 24 hours prior to procedure.

## 2018-11-26 ENCOUNTER — Other Ambulatory Visit (HOSPITAL_COMMUNITY): Payer: Self-pay | Admitting: Nurse Practitioner

## 2018-12-15 ENCOUNTER — Other Ambulatory Visit: Payer: Self-pay | Admitting: Cardiovascular Disease

## 2019-01-06 ENCOUNTER — Encounter: Payer: Self-pay | Admitting: *Deleted

## 2019-01-09 ENCOUNTER — Telehealth: Payer: Self-pay | Admitting: Cardiology

## 2019-01-09 NOTE — Telephone Encounter (Signed)
Please reschedule his appointment for over 12 weeks unless he has new or worsening cardiac symptoms.  Ena Dawley, MD

## 2019-01-09 NOTE — Telephone Encounter (Signed)
I have reviewed patient's chart and his appointment can be postponed for over 12 weeks unless he has any new or worsening cardiac symptoms.  Ena Dawley, MD 01/09/2019

## 2019-01-09 NOTE — Telephone Encounter (Addendum)
   Primary Cardiologist:  Ena Dawley MD  Patient contacted.  History reviewed.  No symptoms to suggest any unstable cardiac conditions.  Based on discussion, with current pandemic situation, we will be postponing this appointment for Lucas Dudley with a plan for f/u in 12 wks or greater if feasible/necessary.  If symptoms change, he has been instructed to contact our office.   Routing to C19 CANCEL pool for tracking (P CV DIV CV19 CANCEL - reason for visit "other.") and assigning priority (1 = 4-6 wks, 2 = 6-12 wks, 3 = >12 wks).   Nuala Alpha, LPN  06/13/9406 68:08 PM   Pt states he is good on refills at this time.  Pt verbalized understanding and agrees with this plan.       [name]

## 2019-01-09 NOTE — Telephone Encounter (Signed)
SEE PREVIOUS TELEPHONE ENCOUNTER REGARDING THIS PT AND THIS ISSUE.

## 2019-01-15 ENCOUNTER — Ambulatory Visit: Payer: Medicare Other | Admitting: Cardiology

## 2019-02-10 ENCOUNTER — Telehealth: Payer: Self-pay | Admitting: Cardiology

## 2019-03-14 ENCOUNTER — Other Ambulatory Visit: Payer: Self-pay | Admitting: Cardiovascular Disease

## 2019-03-31 ENCOUNTER — Telehealth: Payer: Self-pay | Admitting: Cardiology

## 2019-03-31 NOTE — Telephone Encounter (Signed)
New message   Patient states that he will not be able to come to the 10:40 am appt on 04/27/19. He states that he will need an appt after 1:00 pm. Please advise.

## 2019-03-31 NOTE — Telephone Encounter (Signed)
Spoke with the pt and informed him that I changed his appt time on 7/13 with Dr Meda Coffee, to her last pt of the day at 3:40 pm.  Informed the pt of strict visitor restrictions, and that we would be calling him prior to his appt for screening.  Pt verbalized understanding and agrees with this plan.

## 2019-04-22 ENCOUNTER — Telehealth: Payer: Self-pay | Admitting: *Deleted

## 2019-04-22 NOTE — Telephone Encounter (Signed)
PATIENT WILL RETURN CALL TO THE OFFICE TO DO COVID PRESCREEN QUESTIONS.  ..    COVID-19 Pre-Screening Questions:  . In the past 7 to 10 days have you had a cough,  shortness of breath, headache, congestion, fever (100 or greater) body aches, chills, sore throat, or sudden loss of taste or sense of smell? . Have you been around anyone with known Covid 19. . Have you been around anyone who is awaiting Covid 19 test results in the past 7 to 10 days? . Have you been around anyone who has been exposed to Covid 19, or has mentioned symptoms of Covid 19 within the past 7 to 10 days?  If you have any concerns/questions about symptoms patients report during screening (either on the phone or at threshold). Contact the provider seeing the patient or DOD for further guidance.  If neither are available contact a member of the leadership team.

## 2019-04-23 NOTE — Telephone Encounter (Signed)
    COVID-19 Pre-Screening Questions:  . In the past 7 to 10 days have you had a cough,  shortness of breath, headache, congestion, fever (100 or greater) body aches, chills, sore throat, or sudden loss of taste or sense of smell?-NO . Have you been around anyone with known Covid 19.-NO . Have you been around anyone who is awaiting Covid 19 test results in the past 7 to 10 days?-NO . Have you been around anyone who has been exposed to Covid 19, or has mentioned symptoms of Covid 19 within the past 7 to 10 days?-NO     Covid pre-screening questions performed for pts upcoming visit with Dr Meda Coffee on 7/14 at 3:40 pm.  Pt is aware to wear his facial mask during the duration of the entire OV.  Pt is aware of our no visitor restriction policy. Pt verbalized understanding and agrees with this plan.

## 2019-04-27 ENCOUNTER — Ambulatory Visit: Payer: Medicare Other | Admitting: Cardiology

## 2019-04-27 ENCOUNTER — Encounter: Payer: Self-pay | Admitting: Cardiology

## 2019-04-27 ENCOUNTER — Other Ambulatory Visit: Payer: Self-pay

## 2019-04-27 VITALS — BP 164/74 | HR 70 | Ht 70.5 in | Wt 241.2 lb

## 2019-04-27 DIAGNOSIS — I10 Essential (primary) hypertension: Secondary | ICD-10-CM

## 2019-04-27 DIAGNOSIS — I480 Paroxysmal atrial fibrillation: Secondary | ICD-10-CM | POA: Diagnosis not present

## 2019-04-27 DIAGNOSIS — Z7901 Long term (current) use of anticoagulants: Secondary | ICD-10-CM | POA: Diagnosis not present

## 2019-04-27 DIAGNOSIS — G4733 Obstructive sleep apnea (adult) (pediatric): Secondary | ICD-10-CM | POA: Diagnosis not present

## 2019-04-27 DIAGNOSIS — Z9989 Dependence on other enabling machines and devices: Secondary | ICD-10-CM

## 2019-04-27 MED ORDER — LISINOPRIL 40 MG PO TABS: 40.0000 mg | ORAL_TABLET | Freq: Every day | ORAL | 2 refills | Status: DC

## 2019-04-27 NOTE — Progress Notes (Signed)
Cardiology Office Note:    Date:  04/27/2019   ID:  REMO KIRSCHENMANN, DOB Nov 20, 1940, MRN [id number]  PCP:  [name], MD  Cardiologist:  Quay Burow, MD  Electrophysiologist:  Thompson Grayer, MD   Referring MD: [name], MD   History of Present Illness:    Lucas Dudley is a 78 y.o. male with history of paroxysmal atrial fibrillation status post ablation by Dr. Rayann Heman on chronic Eliquis therapy, he is also being followed for hypertension and obstructive sleep apnea.  He is coming to my clinic to establish cardiology care, he is fairly active and denies any chest pain shortness of breath, no lower extremity edema orthopnea proximal nocturnal dyspnea he has been compliant with his medications and his no bleeding.  His blood pressure has been elevated lately.  He states that he needs a new CPAP machine.  Past Medical History:  Diagnosis Date  . Arthritis    "hands probably" (05/04/2015)  . CAD (coronary artery disease)    a. Cath 03/17/15 showing 100% ostial D1, 50% prox LAD to mid LAD, 40% RPDA stenosis. Med rx.  . Glaucoma   . History of gout   . Hyperlipidemia   . Hypertension   . Kidney stones    "found during prostate OR; still in there as far as I know" (05/04/2015)  . Melanoma of neck ([place])    "right; came back IV; no chemo"  . NICM (nonischemic cardiomyopathy) ([place])   . OSA on CPAP 2012  . Paroxysmal atrial fibrillation ([facility])    a. 04/2015 Tikosyn loading, DCCV x 1;  b. CHA2DS2VASc = 5-->Eliquis.  . Prostate cancer ([place])   . Type II diabetes mellitus ([place])     Past Surgical History:  Procedure Laterality Date  . ABDOMINAL HERNIA REPAIR     w/mesh  . CARDIAC CATHETERIZATION N/A 03/17/2015   Procedure: Left Heart Cath and Coronary Angiography;  Surgeon: Lorretta Harp, MD;  Location: [facility];  Service: Cardiovascular;  Laterality: N/A;  . carotid doppler  09/17/2008   rigt and left ICAs 0-49%;mildly  abnormal  . COLONOSCOPY N/A 05/16/2017   Procedure:  COLONOSCOPY;  Surgeon: Rogene Houston, MD;  [facility];  Service: Endoscopy;  Laterality: N/A;  930  . DOPPLER ECHOCARDIOGRAPHY  05/25/2009   EF 50-55%,LA mildly dilated, LV function normal  . ELECTROPHYSIOLOGIC STUDY N/A 04/05/2015   Procedure: Cardioversion;  Surgeon: Sanda Klein, MD;  Location: [facility];  Service: Cardiovascular;  Laterality: N/A;  . ELECTROPHYSIOLOGIC STUDY N/A 09/06/2015   Procedure: Atrial Fibrillation Ablation;  Surgeon: Thompson Grayer, MD;  Location: [facility];  Service: Cardiovascular;  Laterality: N/A;  . ELECTROPHYSIOLOGIC STUDY N/A 07/12/2016   redo afib ablation by Dr Rayann Heman  . EXCISIONAL HEMORRHOIDECTOMY     "inside and out"  . FINE NEEDLE ASPIRATION Right    knee; "drew ~ 1 quart off"  . HERNIA REPAIR    . LAPAROSCOPIC CHOLECYSTECTOMY    . MELANOMA EXCISION Right    "neck"  . NM MYOCAR PERF WALL MOTION  02/21/2012   EF 61% ,EXERCISE 7 METS. exercise stopped due to wheezing and shortness of breathe  . POLYPECTOMY  05/16/2017   Procedure: POLYPECTOMY;  Surgeon: Rogene Houston, MD;  [facility];  Service: Endoscopy;;  colon  . PROSTATECTOMY    . SHOULDER OPEN ROTATOR CUFF REPAIR Right X 2  . TEE WITHOUT CARDIOVERSION N/A 09/05/2015   Procedure: TRANSESOPHAGEAL ECHOCARDIOGRAM (TEE);  Surgeon: Sueanne Margarita, MD;  Location: [facility];  Service: Cardiovascular;  Laterality: N/A;    Current Medications: Current Meds  Medication Sig  . apixaban (ELIQUIS) 5 MG TABS tablet Take 1 tablet (5 mg total) by mouth 2 (two) times daily.  . Carboxymethylcellul-Glycerin (LUBRICATING EYE DROPS OP) Apply 1 drop to eye daily as needed (itching eyes).  . carvedilol (COREG) 12.5 MG tablet TAKE 1/2 TABLETS BY MOUTH TWICE DAILY WITH A MEAL  . clindamycin (CLEOCIN) 150 MG capsule Take 2 capsules (300 mg total) by mouth every 6 (six) hours.  . furosemide (LASIX) 20 MG tablet Take 20 mg by mouth daily.  [name] gemfibrozil (LOPID) 600 MG  tablet Take 600 mg by mouth at bedtime.   [name] glimepiride (AMARYL) 2 MG tablet Take 2 mg by mouth 2 (two) times daily.   . Ibuprofen-diphenhydrAMINE HCl (ADVIL PM) 200-25 MG CAPS Take 1 capsule by mouth at bedtime.  . Insulin Glargine (BASAGLAR KWIKPEN) 100 UNIT/ML SOPN Inject 40 Units into the skin at bedtime.   [name] latanoprost (XALATAN) 0.005 % ophthalmic solution Place 1 drop into both eyes at bedtime.  . Melatonin 5 MG TABS Take 5 mg by mouth at bedtime.  . metFORMIN (GLUCOPHAGE) 500 MG tablet Take 1,000 mg by mouth 2 (two) times daily.  . valACYclovir (VALTREX) 1000 MG tablet Take 1,000 mg by mouth daily.  . [DISCONTINUED] lisinopril (PRINIVIL,ZESTRIL) 20 MG tablet Take 20 mg by mouth every morning.      Allergies:   Tape   Social History   Socioeconomic History  . Marital status: Married    Spouse name: Not on file  . Number of children: Not on file  . Years of education: Not on file  . Highest education level: Not on file  Occupational History  . Occupation: Retired  Scientific laboratory technician  . Financial resource strain: Not on file  . Food insecurity    Worry: Not on file    Inability: Not on file  . Transportation needs    Medical: Not on file    Non-medical: Not on file  Tobacco Use  . Smoking status: Former Smoker    Packs/day: 2.00    Years: 27.00    Pack years: 54.00    Types: Cigarettes    Quit date: 10/31/1983    Years since quitting: 35.5  . Smokeless tobacco: Never Used  Substance and Sexual Activity  . Alcohol use: No    Alcohol/week: 0.0 standard drinks    Comment: "used to drink; stopped ~ 2008"  . Drug use: No  . Sexual activity: Not Currently  Lifestyle  . Physical activity    Days per week: Not on file    Minutes per session: Not on file  . Stress: Not on file  Relationships  . Social Herbalist on phone: Not on file    Gets together: Not on file    Attends religious service: Not on file    Active member of club or organization: Not on file     Attends meetings of clubs or organizations: Not on file    Relationship status: Not on file  Other Topics Concern  . Not on file  Social History Narrative   Lives in [place], [place] with wife.     Family History: The patient's family history includes Diabetes in his father; Healthy in his daughter; Heart attack (age of onset: 30) in his mother; Heart disease in his father and mother; Lung cancer in  his mother; Stroke in his brother.  ROS:   Please see the history of present illness.    All other systems reviewed and are negative.  EKGs/Labs/Other Studies Reviewed:    The following studies were reviewed today: EKG:  EKG is ordered today.  The ekg ordered today demonstrates normal sinus rhythm with first-degree AV block and PACs, incomplete right bundle branch block left anterior fascicular block.  This was personally reviewed.  Recent Labs: 10/18/2018: ALT 17; BUN 23; Creatinine, Ser 0.90; Hemoglobin 15.0; Platelets 222; Potassium 4.2; Sodium 136  Recent Lipid Panel No results found for: CHOL, TRIG, HDL, CHOLHDL, VLDL, LDLCALC, LDLDIRECT  Physical Exam:    VS:  BP (!) 164/74   Pulse 70   Ht 5' 10.5" (1.791 m)   Wt 241 lb 3.2 oz (109.4 kg)   SpO2 94%   BMI 34.12 kg/m     Wt Readings from Last 3 Encounters:  04/27/19 241 lb 3.2 oz (109.4 kg)  10/20/18 239 lb 9.6 oz (108.7 kg)  10/18/18 240 lb 4.8 oz (109 kg)     GEN:  Well nourished, well developed in no acute distress HEENT: Normal NECK: No JVD; No carotid bruits LYMPHATICS: No lymphadenopathy CARDIAC: RRR, no murmurs, rubs, gallops RESPIRATORY:  Clear to auscultation without rales, wheezing or rhonchi  ABDOMEN: Soft, non-tender, non-distended MUSCULOSKELETAL:  No edema; No deformity  SKIN: Warm and dry NEUROLOGIC:  Alert and oriented x 3 PSYCHIATRIC:  Normal affect   ASSESSMENT:    1. Essential hypertension   2. PAF (paroxysmal atrial fibrillation) ([place])   3. OSA on CPAP   4. Chronic anticoagulation    PLAN:     In order of problems listed above:  1. Persistent atrial fibrillation Doing well off AAD therapy No sustained AF Continue Eliquis for CHADS2VASC of 5  2. HTN BP elevated in clinic today I have asked him to check his blood pressure at home for the next few days and call if SBP averaging >140  3. Obstructive sleep apnea The importance of adequate treatment of sleep apnea was discussed today in order to improve our ability to maintain sinus rhythm long term. The patient reports compliance with CPAP    Medication Adjustments/Labs and Tests Ordered: Current medicines are reviewed at length with the patient today.  Concerns regarding medicines are outlined above.  Orders Placed This Encounter  Procedures  . EKG 12-Lead   Meds ordered this encounter  Medications  . lisinopril (ZESTRIL) 40 MG tablet    Sig: Take 1 tablet (40 mg total) by mouth daily.    Dispense:  90 tablet    Refill:  2    Patient Instructions  Medication Instructions:   INCREASE YOUR LISINOPRIL TO 40 MG ONCE DAILY  If you need a refill on your cardiac medications before your next appointment, please call your pharmacy.     Follow-Up: At [facility], you and your health needs are our priority.  As part of our continuing mission to provide you with exceptional heart care, we have created designated Provider Care Teams.  These Care Teams include your primary Cardiologist (physician) and Advanced Practice Providers (APPs -  Physician Assistants and Nurse Practitioners) who all work together to provide you with the care you need, when you need it. You will need a follow up appointment in 6 months Dr. Meda Coffee.  Please call our office 2 months in advance to schedule this appointment.  You may see Dr. Meda Coffee or one of the following Advanced  Practice Providers on your designated Care Team:   Lyda Jester, PA-C Melina Copa, [place] . Ermalinda Barrios, PA-C        Signed, Ena Dawley, MD  04/27/2019 11:04 PM     [place]

## 2019-04-27 NOTE — Patient Instructions (Signed)
Medication Instructions:   INCREASE YOUR LISINOPRIL TO 40 MG ONCE DAILY  If you need a refill on your cardiac medications before your next appointment, please call your pharmacy.     Follow-Up: At [facility], you and your health needs are our priority.  As part of our continuing mission to provide you with exceptional heart care, we have created designated Provider Care Teams.  These Care Teams include your primary Cardiologist (physician) and Advanced Practice Providers (APPs -  Physician Assistants and Nurse Practitioners) who all work together to provide you with the care you need, when you need it. You will need a follow up appointment in 6 months Dr. Meda Coffee.  Please call our office 2 months in advance to schedule this appointment.  You may see Dr. Meda Coffee or one of the following Advanced Practice Providers on your designated Care Team:   [place], PA-C Melina Copa, PA-C . Ermalinda Barrios, PA-C

## 2019-05-22 ENCOUNTER — Ambulatory Visit: Payer: Medicare Other | Admitting: Cardiovascular Disease

## 2019-08-27 ENCOUNTER — Telehealth: Payer: Self-pay | Admitting: Internal Medicine

## 2019-08-27 MED ORDER — CARVEDILOL 12.5 MG PO TABS: ORAL_TABLET | ORAL | 3 refills | Status: DC

## 2019-08-27 NOTE — Telephone Encounter (Signed)
New message   Pt c/o medication issue:  1. Name of Medication:carvedilol (COREG) 12.5 MG tablet  2. How are you currently taking this medication (dosage and times per day)? 1 tablet in morning and 1/2 in the evening  3. Are you having a reaction (difficulty breathing--STAT)?n/a  4. What is your medication issue? There is a discrepancy in the prescription per [facility]. Please call to discuss.

## 2019-08-27 NOTE — Telephone Encounter (Signed)
Spoke with the pt and with [facility] and pt says he has been on Coreg 12.5 mg 1/2 tab in the a.m. and 1 tablet in the p.m..... he discussed this with Dr. Rayann Heman at his 10/20/18 appt and he says he has been feeling well. New rx sent in per Dr. Rayann Heman approval.

## 2019-10-21 ENCOUNTER — Ambulatory Visit (HOSPITAL_COMMUNITY)
Admission: RE | Admit: 2019-10-21 | Discharge: 2019-10-21 | Disposition: A | Discharge status: Home / Self Care | Payer: Medicare Other | Source: Ambulatory Visit | Attending: Nurse Practitioner | Admitting: Nurse Practitioner

## 2019-10-21 ENCOUNTER — Other Ambulatory Visit: Payer: Self-pay

## 2019-10-21 ENCOUNTER — Encounter (HOSPITAL_COMMUNITY): Payer: Self-pay | Admitting: Nurse Practitioner

## 2019-10-21 VITALS — BP 150/74 | HR 57 | Ht 70.5 in | Wt 247.8 lb

## 2019-10-21 DIAGNOSIS — E785 Hyperlipidemia, unspecified: Secondary | ICD-10-CM | POA: Diagnosis not present

## 2019-10-21 DIAGNOSIS — Z888 Allergy status to other drugs, medicaments and biological substances status: Secondary | ICD-10-CM | POA: Diagnosis not present

## 2019-10-21 DIAGNOSIS — Z8249 Family history of ischemic heart disease and other diseases of the circulatory system: Secondary | ICD-10-CM | POA: Insufficient documentation

## 2019-10-21 DIAGNOSIS — I428 Other cardiomyopathies: Secondary | ICD-10-CM | POA: Diagnosis not present

## 2019-10-21 DIAGNOSIS — I444 Left anterior fascicular block: Secondary | ICD-10-CM | POA: Insufficient documentation

## 2019-10-21 DIAGNOSIS — Z791 Long term (current) use of non-steroidal anti-inflammatories (NSAID): Secondary | ICD-10-CM | POA: Insufficient documentation

## 2019-10-21 DIAGNOSIS — I10 Essential (primary) hypertension: Secondary | ICD-10-CM | POA: Diagnosis not present

## 2019-10-21 DIAGNOSIS — Z794 Long term (current) use of insulin: Secondary | ICD-10-CM | POA: Diagnosis not present

## 2019-10-21 DIAGNOSIS — H409 Unspecified glaucoma: Secondary | ICD-10-CM | POA: Insufficient documentation

## 2019-10-21 DIAGNOSIS — E119 Type 2 diabetes mellitus without complications: Secondary | ICD-10-CM | POA: Diagnosis not present

## 2019-10-21 DIAGNOSIS — I4819 Other persistent atrial fibrillation: Secondary | ICD-10-CM | POA: Diagnosis not present

## 2019-10-21 DIAGNOSIS — Z7901 Long term (current) use of anticoagulants: Secondary | ICD-10-CM | POA: Insufficient documentation

## 2019-10-21 DIAGNOSIS — I2510 Atherosclerotic heart disease of native coronary artery without angina pectoris: Secondary | ICD-10-CM | POA: Diagnosis not present

## 2019-10-21 DIAGNOSIS — Z8546 Personal history of malignant neoplasm of prostate: Secondary | ICD-10-CM | POA: Insufficient documentation

## 2019-10-21 DIAGNOSIS — M19049 Primary osteoarthritis, unspecified hand: Secondary | ICD-10-CM | POA: Diagnosis not present

## 2019-10-21 DIAGNOSIS — Z79899 Other long term (current) drug therapy: Secondary | ICD-10-CM | POA: Insufficient documentation

## 2019-10-21 DIAGNOSIS — I491 Atrial premature depolarization: Secondary | ICD-10-CM | POA: Insufficient documentation

## 2019-10-21 DIAGNOSIS — Z87891 Personal history of nicotine dependence: Secondary | ICD-10-CM | POA: Diagnosis not present

## 2019-10-21 DIAGNOSIS — Z823 Family history of stroke: Secondary | ICD-10-CM | POA: Diagnosis not present

## 2019-10-21 DIAGNOSIS — D6869 Other thrombophilia: Secondary | ICD-10-CM | POA: Diagnosis not present

## 2019-10-21 DIAGNOSIS — Z85820 Personal history of malignant melanoma of skin: Secondary | ICD-10-CM | POA: Insufficient documentation

## 2019-10-21 DIAGNOSIS — Z833 Family history of diabetes mellitus: Secondary | ICD-10-CM | POA: Insufficient documentation

## 2019-10-21 DIAGNOSIS — Z9079 Acquired absence of other genital organ(s): Secondary | ICD-10-CM | POA: Diagnosis not present

## 2019-10-21 DIAGNOSIS — Z801 Family history of malignant neoplasm of trachea, bronchus and lung: Secondary | ICD-10-CM | POA: Diagnosis not present

## 2019-10-21 DIAGNOSIS — R9431 Abnormal electrocardiogram [ECG] [EKG]: Secondary | ICD-10-CM | POA: Insufficient documentation

## 2019-10-21 DIAGNOSIS — G4733 Obstructive sleep apnea (adult) (pediatric): Secondary | ICD-10-CM | POA: Diagnosis not present

## 2019-10-21 NOTE — Progress Notes (Signed)
Primary Care Physician: [name], MD Primary Cardiologist: Gwenlyn Found Primary Electrophysiologist: Genevie Ann is a 79 y.o. male with a history of persistent atrial fibrillation who presents for follow up in the [facility].  Since last being seen in clinic, the patient reports doing very well. afib burden low. No sustained AF. No bleeding issues on [place].    Today, he  denies symptoms of palpitations, chest pain, shortness of breath, orthopnea, PND, lower extremity edema, dizziness, presyncope, syncope, snoring, daytime somnolence, bleeding, or neurologic sequela. The patient is tolerating medications without difficulties and is otherwise without complaint today.    Atrial Fibrillation Risk Factors:  he does have symptoms or diagnosis of sleep apnea. he is compliant with CPAP therapy.  he does not have a history of rheumatic fever.  he does not have a history of alcohol use.  he has a BMI of Body mass index is 35.05 kg/m.[name] Filed Weights   10/21/19 1404  Weight: 112.4 kg    LA size: 40   Atrial Fibrillation Management history:  Previous antiarrhythmic drugs: Tikosyn  Previous cardioversions: 2016  Previous ablations: 2016  CHADS2VASC score: 5  Anticoagulation history: Eliquis   Past Medical History:  Diagnosis Date  . Arthritis    "hands probably" (05/04/2015)  . CAD (coronary artery disease)    a. Cath 03/17/15 showing 100% ostial D1, 50% prox LAD to mid LAD, 40% RPDA stenosis. Med rx.  . Glaucoma   . History of gout   . Hyperlipidemia   . Hypertension   . Kidney stones    "found during prostate OR; still in there as far as I know" (05/04/2015)  . Melanoma of neck ([place])    "right; came back IV; no chemo"  . NICM (nonischemic cardiomyopathy) ([place])   . OSA on CPAP 2012  . Paroxysmal atrial fibrillation ([facility])    a. 04/2015 Tikosyn loading, DCCV x 1;  b. CHA2DS2VASc = 5-->Eliquis.  . Prostate cancer ([place])   . Type II  diabetes mellitus ([place])    Past Surgical History:  Procedure Laterality Date  . ABDOMINAL HERNIA REPAIR     w/mesh  . CARDIAC CATHETERIZATION N/A 03/17/2015   Procedure: Left Heart Cath and Coronary Angiography;  Surgeon: Lorretta Harp, MD;  Location: [facility];  Service: Cardiovascular;  Laterality: N/A;  . carotid doppler  09/17/2008   rigt and left ICAs 0-49%;mildly  abnormal  . COLONOSCOPY N/A 05/16/2017   Procedure: COLONOSCOPY;  Surgeon: Rogene Houston, MD;  [facility];  Service: Endoscopy;  Laterality: N/A;  930  . DOPPLER ECHOCARDIOGRAPHY  05/25/2009   EF 50-55%,LA mildly dilated, LV function normal  . ELECTROPHYSIOLOGIC STUDY N/A 04/05/2015   Procedure: Cardioversion;  Surgeon: Sanda Klein, MD;  Location: [facility];  Service: Cardiovascular;  Laterality: N/A;  . ELECTROPHYSIOLOGIC STUDY N/A 09/06/2015   Procedure: Atrial Fibrillation Ablation;  Surgeon: Thompson Grayer, MD;  Location: [facility];  Service: Cardiovascular;  Laterality: N/A;  . ELECTROPHYSIOLOGIC STUDY N/A 07/12/2016   redo afib ablation by Dr Rayann Heman  . EXCISIONAL HEMORRHOIDECTOMY     "inside and out"  . FINE NEEDLE ASPIRATION Right    knee; "drew ~ 1 quart off"  . HERNIA REPAIR    . LAPAROSCOPIC CHOLECYSTECTOMY    . MELANOMA EXCISION Right    "neck"  . NM MYOCAR PERF WALL MOTION  02/21/2012   EF 61% ,EXERCISE 7 METS. exercise stopped due to  wheezing and shortness of breathe  . POLYPECTOMY  05/16/2017   Procedure: POLYPECTOMY;  Surgeon: Rogene Houston, MD;  [facility];  Service: Endoscopy;;  colon  . PROSTATECTOMY    . SHOULDER OPEN ROTATOR CUFF REPAIR Right X 2  . TEE WITHOUT CARDIOVERSION N/A 09/05/2015   Procedure: TRANSESOPHAGEAL ECHOCARDIOGRAM (TEE);  Surgeon: Sueanne Margarita, MD;  Location: [facility];  Service: Cardiovascular;  Laterality: N/A;    Current Outpatient Medications  Medication Sig Dispense Refill  . apixaban (ELIQUIS) 5 MG TABS  tablet Take 1 tablet (5 mg total) by mouth 2 (two) times daily. 180 tablet 1  . Carboxymethylcellul-Glycerin (LUBRICATING EYE DROPS OP) Apply 1 drop to eye daily as needed (itching eyes).    . carvedilol (COREG) 12.5 MG tablet Take 1/2 tablet (6.25 mg) by mouth in the a.m. and 1 whole tablet (12.5 mg) in the p.m. 135 tablet 3  . furosemide (LASIX) 20 MG tablet Take 20 mg by mouth daily.    [name] gemfibrozil (LOPID) 600 MG tablet Take 600 mg by mouth at bedtime.     [name] glimepiride (AMARYL) 2 MG tablet Take 2 mg by mouth 2 (two) times daily.     . Ibuprofen-diphenhydrAMINE HCl (ADVIL PM) 200-25 MG CAPS Take 1 capsule by mouth at bedtime.    . Insulin Glargine (BASAGLAR KWIKPEN) 100 UNIT/ML SOPN Inject 40 Units into the skin at bedtime.     [name] latanoprost (XALATAN) 0.005 % ophthalmic solution Place 1 drop into both eyes at bedtime.    [name] lisinopril (ZESTRIL) 40 MG tablet Take 1 tablet (40 mg total) by mouth daily. 90 tablet 2  . metFORMIN (GLUCOPHAGE) 500 MG tablet Take 1,000 mg by mouth 2 (two) times daily.    . valACYclovir (VALTREX) 1000 MG tablet Take 1,000 mg by mouth daily.     No current facility-administered medications for this encounter.    Allergies  Allergen Reactions  . Tape Rash and Other (See Comments)    Causes skin redness, Use paper tape only.    Social History   Socioeconomic History  . Marital status: Married    Spouse name: Not on file  . Number of children: Not on file  . Years of education: Not on file  . Highest education level: Not on file  Occupational History  . Occupation: Retired  Tobacco Use  . Smoking status: Former Smoker    Packs/day: 2.00    Years: 27.00    Pack years: 54.00    Types: Cigarettes    Quit date: 10/31/1983    Years since quitting: 35.9  . Smokeless tobacco: Never Used  Substance and Sexual Activity  . Alcohol use: No    Alcohol/week: 0.0 standard drinks    Comment: "used to drink; stopped ~ 2008"  . Drug use: No  . Sexual activity:  Not Currently  Other Topics Concern  . Not on file  Social History Narrative   Lives in [place], [place] with wife.   Social Determinants of Health   Financial Resource Strain:   . Difficulty of Paying Living Expenses: Not on file  Food Insecurity:   . Worried About Charity fundraiser in the Last Year: Not on file  . Ran Out of Food in the Last Year: Not on file  Transportation Needs:   . Lack of Transportation (Medical): Not on file  . Lack of Transportation (Non-Medical): Not on file  Physical Activity:   . Days of Exercise per Week: Not  on file  . Minutes of Exercise per Session: Not on file  Stress:   . Feeling of Stress : Not on file  Social Connections:   . Frequency of Communication with Friends and Family: Not on file  . Frequency of Social Gatherings with Friends and Family: Not on file  . Attends Religious Services: Not on file  . Active Member of Clubs or Organizations: Not on file  . Attends Archivist Meetings: Not on file  . Marital Status: Not on file  Intimate Partner Violence:   . Fear of Current or Ex-Partner: Not on file  . Emotionally Abused: Not on file  . Physically Abused: Not on file  . Sexually Abused: Not on file    Family History  Problem Relation Age of Onset  . Heart disease Mother   . Lung cancer Mother   . Heart attack Mother 70  . Stroke Brother   . Healthy Daughter   . Diabetes Father   . Heart disease Father     ROS- All systems are reviewed and negative except as per the HPI above.  Physical Exam: Vitals:   10/21/19 1404  BP: (!) 150/74  Pulse: (!) 57  Weight: 112.4 kg  Height: 5' 10.5" (1.791 m)    GEN- The patient is well appearing, alert and oriented x 3 today.   Head- normocephalic, atraumatic Eyes-  Sclera clear, conjunctiva pink Ears- hearing intact Oropharynx- clear Neck- supple  Lungs- Clear to ausculation bilaterally, normal work of breathing Heart- Regular rate and rhythm  GI- soft, NT, ND, +  BS Extremities- no clubbing, cyanosis, or edema MS- no significant deformity or atrophy Skin- no rash or lesion Psych- euthymic mood, full affect Neuro- strength and sensation are intact  Wt Readings from Last 3 Encounters:  10/21/19 112.4 kg  04/27/19 109.4 kg  10/20/18 108.7 kg    EKG today demonstrates  Sinus brady at 57 bpm, with first degree block. PR int 248 ms, qrs int 108 ms, qtc 424 ms  Epic records are reviewed at length today  Assessment and Plan:  1. Persistent atrial fibrillation Doing well off AAD therapy No sustained AF Continue Eliquis for CHADS2VASC of 5  2. HTN Stable   3. Obstructive sleep apnea The importance of adequate treatment of sleep apnea was discussed today in order to improve our ability to maintain sinus rhythm long term. The patient reports compliance with CPAP   Follow up here in 6 months    Roderic Palau, NP 10/21/2019 3:13 PM

## 2019-10-27 NOTE — Progress Notes (Signed)
Cardiology Office Note    Date:  10/28/2019   ID:  Lucas Dudley, DOB May 15, 1941, MRN [id number]  PCP:  [name], MD  Cardiologist: Quay Burow, MD EPS: Thompson Grayer, MD  No chief complaint on file.   History of Present Illness:  Lucas Dudley is a 79 y.o. male with history of PAF CHA2DS2-VASc equals 5 on Eliquis status post ablation 2016, CAD cath in 2016 total ostial diagonal 1, 50% proximal to mid LAD, 40% RPDA treated medically, hypertension, HLD, OSA on CPAP.  Last office visit with Dr. Meda Coffee 04/2019 blood pressure was elevated and she asked him to check it at home and call if it stayed elevated.  Patient comes in for f/u. BP running high here and at home despite increase of lisinopril. Wife in hospital with severe dementia trying to adjust meds and then nursing home. He's not able to see her and under a lot of stress.Eats most meals out. He said 2 weeks ago he woke up with Afib going fast but eventually  Fell asleep. CPAP isn't working.     Past Medical History:  Diagnosis Date  . Arthritis    "hands probably" (05/04/2015)  . CAD (coronary artery disease)    a. Cath 03/17/15 showing 100% ostial D1, 50% prox LAD to mid LAD, 40% RPDA stenosis. Med rx.  . Glaucoma   . History of gout   . Hyperlipidemia   . Hypertension   . Kidney stones    "found during prostate OR; still in there as far as I know" (05/04/2015)  . Melanoma of neck ([place])    "right; came back IV; no chemo"  . NICM (nonischemic cardiomyopathy) ([place])   . OSA on CPAP 2012  . Paroxysmal atrial fibrillation (HCC)    a. 04/2015 Tikosyn loading, DCCV x 1;  b. CHA2DS2VASc = 5-->Eliquis.  . Prostate cancer ([place])   . Type II diabetes mellitus ([place])     Past Surgical History:  Procedure Laterality Date  . ABDOMINAL HERNIA REPAIR     w/mesh  . CARDIAC CATHETERIZATION N/A 03/17/2015   Procedure: Left Heart Cath and Coronary Angiography;  Surgeon: Lorretta Harp, MD;  Location: [facility];  Service:  Cardiovascular;  Laterality: N/A;  . carotid doppler  09/17/2008   rigt and left ICAs 0-49%;mildly  abnormal  . COLONOSCOPY N/A 05/16/2017   Procedure: COLONOSCOPY;  Surgeon: Rogene Houston, MD;  [facility];  Service: Endoscopy;  Laterality: N/A;  930  . DOPPLER ECHOCARDIOGRAPHY  05/25/2009   EF 50-55%,LA mildly dilated, LV function normal  . ELECTROPHYSIOLOGIC STUDY N/A 04/05/2015   Procedure: Cardioversion;  Surgeon: Sanda Klein, MD;  Location: [facility];  Service: Cardiovascular;  Laterality: N/A;  . ELECTROPHYSIOLOGIC STUDY N/A 09/06/2015   Procedure: Atrial Fibrillation Ablation;  Surgeon: Thompson Grayer, MD;  Location: [facility];  Service: Cardiovascular;  Laterality: N/A;  . ELECTROPHYSIOLOGIC STUDY N/A 07/12/2016   [facility] afib ablation by Dr Rayann Heman  . EXCISIONAL HEMORRHOIDECTOMY     "inside and out"  . FINE NEEDLE ASPIRATION Right    knee; "drew ~ 1 quart off"  . HERNIA REPAIR    . LAPAROSCOPIC CHOLECYSTECTOMY    . MELANOMA EXCISION Right    "neck"  . NM MYOCAR PERF WALL MOTION  02/21/2012   EF 61% ,EXERCISE 7 METS. exercise stopped due to wheezing and shortness of breathe  . POLYPECTOMY  05/16/2017   Procedure: POLYPECTOMY;  Surgeon: Rogene Houston,  MD;  [facility];  Service: Endoscopy;;  colon  . PROSTATECTOMY    . SHOULDER OPEN ROTATOR CUFF REPAIR Right X 2  . TEE WITHOUT CARDIOVERSION N/A 09/05/2015   Procedure: TRANSESOPHAGEAL ECHOCARDIOGRAM (TEE);  Surgeon: Sueanne Margarita, MD;  Location: [facility];  Service: Cardiovascular;  Laterality: N/A;    Current Medications: Current Meds  Medication Sig  . apixaban (ELIQUIS) 5 MG TABS tablet Take 1 tablet (5 mg total) by mouth 2 (two) times daily.  . Carboxymethylcellul-Glycerin (LUBRICATING EYE DROPS OP) Apply 1 drop to eye daily as needed (itching eyes).  . carvedilol (COREG) 12.5 MG tablet Take 1/2 tablet (6.25 mg) by mouth in the a.m. and 1 whole tablet (12.5 mg) in the p.m.  [name]  furosemide (LASIX) 20 MG tablet Take 20 mg by mouth daily.  [name] gemfibrozil (LOPID) 600 MG tablet Take 600 mg by mouth at bedtime.   [name] glimepiride (AMARYL) 2 MG tablet Take 2 mg by mouth 2 (two) times daily.   . Ibuprofen-diphenhydrAMINE HCl (ADVIL PM) 200-25 MG CAPS Take 1 capsule by mouth at bedtime.  . Insulin Glargine (BASAGLAR KWIKPEN) 100 UNIT/ML SOPN Inject 40 Units into the skin at bedtime.   [name] latanoprost (XALATAN) 0.005 % ophthalmic solution Place 1 drop into both eyes at bedtime.  [name] lisinopril (ZESTRIL) 40 MG tablet Take 1 tablet (40 mg total) by mouth daily.  . metFORMIN (GLUCOPHAGE) 500 MG tablet Take 1,000 mg by mouth 2 (two) times daily.  . valACYclovir (VALTREX) 1000 MG tablet Take 1,000 mg by mouth daily.     Allergies:   Tape   Social History   Socioeconomic History  . Marital status: Married    Spouse name: Not on file  . Number of children: Not on file  . Years of education: Not on file  . Highest education level: Not on file  Occupational History  . Occupation: Retired  Tobacco Use  . Smoking status: Former Smoker    Packs/day: 2.00    Years: 27.00    Pack years: 54.00    Types: Cigarettes    Quit date: 10/31/1983    Years since quitting: 36.0  . Smokeless tobacco: Never Used  Substance and Sexual Activity  . Alcohol use: No    Alcohol/week: 0.0 standard drinks    Comment: "used to drink; stopped ~ 2008"  . Drug use: No  . Sexual activity: Not Currently  Other Topics Concern  . Not on file  Social History Narrative   Lives in [place], [place] with wife.   Social Determinants of Health   Financial Resource Strain:   . Difficulty of Paying Living Expenses: Not on file  Food Insecurity:   . Worried About Charity fundraiser in the Last Year: Not on file  . Ran Out of Food in the Last Year: Not on file  Transportation Needs:   . Lack of Transportation (Medical): Not on file  . Lack of Transportation (Non-Medical): Not on file  Physical Activity:   . Days  of Exercise per Week: Not on file  . Minutes of Exercise per Session: Not on file  Stress:   . Feeling of Stress : Not on file  Social Connections:   . Frequency of Communication with Friends and Family: Not on file  . Frequency of Social Gatherings with Friends and Family: Not on file  . Attends Religious Services: Not on file  . Active Member of Clubs or Organizations: Not on file  .  Attends Archivist Meetings: Not on file  . Marital Status: Not on file     Family History:  The patient's   family history includes Diabetes in his father; Healthy in his daughter; Heart attack (age of onset: 19) in his mother; Heart disease in his father and mother; Lung cancer in his mother; Stroke in his brother.   ROS:   Please see the history of present illness.    ROS All other systems reviewed and are negative.   PHYSICAL EXAM:   VS:  BP (!) 164/80   Pulse 64   Ht 5' 10.5" (1.791 m)   Wt 245 lb (111.1 kg)   SpO2 91%   BMI 34.66 kg/m   Physical Exam  GEN: Obese, in no acute distress  Neck: no JVD, carotid bruits, or masses Cardiac:RRR; no murmurs, rubs, or gallops  Respiratory:  clear to auscultation bilaterally, normal work of breathing GI: soft, nontender, nondistended, + BS Ext: bilateral leg edema Neuro:  Alert and Oriented x 3 Psych: euthymic mood, full affect  Wt Readings from Last 3 Encounters:  10/28/19 245 lb (111.1 kg)  10/21/19 247 lb 12.8 oz (112.4 kg)  04/27/19 241 lb 3.2 oz (109.4 kg)      Studies/Labs Reviewed:   EKG:  EKG is not ordered today.    Recent Labs: No results found for requested labs within last 8760 hours.   Lipid Panel No results found for: CHOL, TRIG, HDL, CHOLHDL, VLDL, LDLCALC, LDLDIRECT  Additional studies/ records that were reviewed today include:  Echo 1/2019Study Conclusions   - Left ventricle: The cavity size was normal. Wall thickness was   increased in a pattern of severe LVH. Systolic function was   normal. The estimated  ejection fraction was in the range of 60%   to 65%. Wall motion was normal; there were no regional wall   motion abnormalities. Features are consistent with a pseudonormal   left ventricular filling pattern, with concomitant abnormal   relaxation and increased filling pressure (grade 2 diastolic   dysfunction).     ASSESSMENT:    1. Paroxysmal atrial fibrillation (HCC)   2. Coronary artery disease involving coronary bypass graft of native heart without angina pectoris   3. Essential hypertension   4. OSA on CPAP   5. Hyperlipidemia, unspecified hyperlipidemia type      PLAN:  In order of problems listed above:  Paroxysmal atrial fibrillation CHA2DS2-VASc equals 5 on Eliquis. Tikosyn stopped and followed closely by A. fib clinic-awakened with rapid afib recently. He'll try coreg12.5 mg bid again-has caused fatigue in past  CAD medical management see description above-no angina.   Essential hypertension BP high-probably a combination of stress, eating out, CPAP not working properly and weight. Try  To increase coreg and see if he tolerates. F/u in 3 weeks  OSA on CPAP but not working properly-refer back to Dr. Claiborne Billings  Hyperlipidemia  On lopid managed by PCP and blood work recently    Medication Adjustments/Labs and Tests Ordered: Current medicines are reviewed at length with the patient today.  Concerns regarding medicines are outlined above.  Medication changes, Labs and Tests ordered today are listed in the Patient Instructions below. Patient Instructions  Your physician has recommended you make the following change in your medication: INCREASE CARVEDILOL TO 12.5 MG TWICE DAILY  Your physician recommends that you return for lab work in:  [facility] recommends that you schedule a follow-up appointment in:  IN 1 -2 WEEKS WITH DR Claiborne Billings FOR CPAP ISSUES AND 3 WEEKS WITH DR Meda Coffee OR [place] IN VIRTUAL CLINIC   COVID-19 Vaccine Information can  be found at: ShippingScam.co.uk For questions related to vaccine distribution or appointments, please email [email or web address] or call [phone].     Low-Sodium Eating Plan Sodium, which is an element that makes up salt, helps you maintain a healthy balance of fluids in your body. Too much sodium can increase your blood pressure and cause fluid and waste to be held in your body. Your health care provider or dietitian may recommend following this plan if you have high blood pressure (hypertension), kidney disease, liver disease, or heart failure. Eating less sodium can help lower your blood pressure, reduce swelling, and protect your heart, liver, and kidneys. What are tips for following this plan? General guidelines  Most people on this plan should limit their sodium intake to 1,500-2,000 mg (milligrams) of sodium each day. Reading food labels   The Nutrition Facts label lists the amount of sodium in one serving of the food. If you eat more than one serving, you must multiply the listed amount of sodium by the number of servings.  Choose foods with less than 140 mg of sodium per serving.  Avoid foods with 300 mg of sodium or more per serving. Shopping  Look for lower-sodium products, often labeled as "low-sodium" or "no salt added."  Always check the sodium content even if foods are labeled as "unsalted" or "no salt added".  Buy fresh foods. ? Avoid canned foods and premade or frozen meals. ? Avoid canned, cured, or processed meats  Buy breads that have less than 80 mg of sodium per slice. Cooking  Eat more home-cooked food and less restaurant, buffet, and fast food.  Avoid adding salt when cooking. Use salt-free seasonings or herbs instead of table salt or sea salt. Check with your health care provider or pharmacist before using salt substitutes.  Cook with plant-based oils, such as canola, sunflower, or olive  oil. Meal planning  When eating at a restaurant, ask that your food be prepared with less salt or no salt, if possible.  Avoid foods that contain MSG (monosodium glutamate). MSG is sometimes added to [place] food, bouillon, and some canned foods. What foods are recommended? The items listed may not be a complete list. Talk with your dietitian about what dietary choices are best for you. Grains Low-sodium cereals, including oats, puffed wheat and rice, and shredded wheat. Low-sodium crackers. Unsalted rice. Unsalted pasta. Low-sodium bread. Whole-grain breads and whole-grain pasta. Vegetables Fresh or frozen vegetables. "No salt added" canned vegetables. "No salt added" tomato sauce and paste. Low-sodium or reduced-sodium tomato and vegetable juice. Fruits Fresh, frozen, or canned fruit. Fruit juice. Meats and other protein foods Fresh or frozen (no salt added) meat, poultry, seafood, and fish. Low-sodium canned tuna and salmon. Unsalted nuts. Dried peas, beans, and lentils without added salt. Unsalted canned beans. Eggs. Unsalted nut butters. Dairy Milk. Soy milk. Cheese that is naturally low in sodium, such as ricotta cheese, fresh mozzarella, or Swiss cheese Low-sodium or reduced-sodium cheese. Cream cheese. Yogurt. Fats and oils Unsalted butter. Unsalted margarine with no trans fat. Vegetable oils such as canola or olive oils. Seasonings and other foods Fresh and dried herbs and spices. Salt-free seasonings. Low-sodium mustard and ketchup. Sodium-free salad dressing. Sodium-free light mayonnaise. Fresh or refrigerated horseradish. Lemon juice. Vinegar. Homemade, reduced-sodium, or low-sodium soups. Unsalted popcorn and pretzels. Low-salt or salt-free chips. What foods  are not recommended? The items listed may not be a complete list. Talk with your dietitian about what dietary choices are best for you. Grains Instant hot cereals. Bread stuffing, pancake, and biscuit mixes. Croutons.  Seasoned rice or pasta mixes. Noodle soup cups. Boxed or frozen macaroni and cheese. Regular salted crackers. Self-rising flour. Vegetables Sauerkraut, pickled vegetables, and relishes. Olives. [place] fries. Onion rings. Regular canned vegetables (not low-sodium or reduced-sodium). Regular canned tomato sauce and paste (not low-sodium or reduced-sodium). Regular tomato and vegetable juice (not low-sodium or reduced-sodium). Frozen vegetables in sauces. Meats and other protein foods Meat or fish that is salted, canned, smoked, spiced, or pickled. Bacon, ham, sausage, hotdogs, corned beef, chipped beef, packaged lunch meats, salt pork, jerky, pickled herring, anchovies, regular canned tuna, sardines, salted nuts. Dairy Processed cheese and cheese spreads. Cheese curds. Blue cheese. Feta cheese. String cheese. Regular cottage cheese. Buttermilk. Canned milk. Fats and oils Salted butter. Regular margarine. Ghee. Bacon fat. Seasonings and other foods Onion salt, garlic salt, seasoned salt, table salt, and sea salt. Canned and packaged gravies. Worcestershire sauce. Tartar sauce. Barbecue sauce. Teriyaki sauce. Soy sauce, including reduced-sodium. Steak sauce. Fish sauce. Oyster sauce. Cocktail sauce. Horseradish that you find on the shelf. Regular ketchup and mustard. Meat flavorings and tenderizers. Bouillon cubes. Hot sauce and Tabasco sauce. Premade or packaged marinades. Premade or packaged taco seasonings. Relishes. Regular salad dressings. Salsa. Potato and tortilla chips. Corn chips and puffs. Salted popcorn and pretzels. Canned or dried soups. Pizza. Frozen entrees and pot pies. Summary  Eating less sodium can help lower your blood pressure, reduce swelling, and protect your heart, liver, and kidneys.  Most people on this plan should limit their sodium intake to 1,500-2,000 mg (milligrams) of sodium each day.  Canned, boxed, and frozen foods are high in sodium. Restaurant foods, fast foods, and  pizza are also very high in sodium. You also get sodium by adding salt to food.  Try to cook at home, eat more fresh fruits and vegetables, and eat less fast food, canned, processed, or prepared foods. This information is not intended to replace advice given to you by your health care provider. Make sure you discuss any questions you have with your health care provider. Document Revised: 09/13/2017 Document Reviewed: 09/24/2016 Elsevier Patient Education  2020 [place] B/P    Signed, Ermalinda Barrios, PA-C  10/28/2019 1:42 PM    [facility] [place], [place], [place]  [address] Phone: [phone]; Fax: [phone]

## 2019-10-28 ENCOUNTER — Other Ambulatory Visit: Payer: Self-pay

## 2019-10-28 ENCOUNTER — Ambulatory Visit: Payer: Medicare Other | Admitting: Physician Assistant

## 2019-10-28 ENCOUNTER — Encounter: Payer: Self-pay | Admitting: Physician Assistant

## 2019-10-28 ENCOUNTER — Telehealth: Payer: Self-pay

## 2019-10-28 VITALS — BP 164/80 | HR 64 | Ht 70.5 in | Wt 245.0 lb

## 2019-10-28 DIAGNOSIS — E785 Hyperlipidemia, unspecified: Secondary | ICD-10-CM

## 2019-10-28 DIAGNOSIS — I480 Paroxysmal atrial fibrillation: Secondary | ICD-10-CM

## 2019-10-28 DIAGNOSIS — G4733 Obstructive sleep apnea (adult) (pediatric): Secondary | ICD-10-CM

## 2019-10-28 DIAGNOSIS — I10 Essential (primary) hypertension: Secondary | ICD-10-CM | POA: Diagnosis not present

## 2019-10-28 DIAGNOSIS — I25810 Atherosclerosis of coronary artery bypass graft(s) without angina pectoris: Secondary | ICD-10-CM

## 2019-10-28 DIAGNOSIS — Z9989 Dependence on other enabling machines and devices: Secondary | ICD-10-CM

## 2019-10-28 MED ORDER — CARVEDILOL 12.5 MG PO TABS: ORAL_TABLET | ORAL | 3 refills | Status: DC

## 2019-10-28 NOTE — Patient Instructions (Addendum)
Your physician has recommended you make the following change in your medication: INCREASE CARVEDILOL TO 12.5 MG TWICE DAILY  Your physician recommends that you return for lab work in:  [place] physician recommends that you schedule a follow-up appointment in:  IN 1 -2 WEEKS WITH DR Claiborne Billings FOR CPAP ISSUES AND 3 WEEKS WITH DR [place]   COVID-19 Vaccine Information can be found at: ShippingScam.co.uk For questions related to vaccine distribution or appointments, please email [email or web address] or call [phone].     Low-Sodium Eating Plan Sodium, which is an element that makes up salt, helps you maintain a healthy balance of fluids in your body. Too much sodium can increase your blood pressure and cause fluid and waste to be held in your body. Your health care provider or dietitian may recommend following this plan if you have high blood pressure (hypertension), kidney disease, liver disease, or heart failure. Eating less sodium can help lower your blood pressure, reduce swelling, and protect your heart, liver, and kidneys. What are tips for following this plan? General guidelines  Most people on this plan should limit their sodium intake to 1,500-2,000 mg (milligrams) of sodium each day. Reading food labels   The Nutrition Facts label lists the amount of sodium in one serving of the food. If you eat more than one serving, you must multiply the listed amount of sodium by the number of servings.  Choose foods with less than 140 mg of sodium per serving.  Avoid foods with 300 mg of sodium or more per serving. Shopping  Look for lower-sodium products, often labeled as "low-sodium" or "no salt added."  Always check the sodium content even if foods are labeled as "unsalted" or "no salt added".  Buy fresh foods. ? Avoid canned foods and premade or frozen meals. ? Avoid  canned, cured, or processed meats  Buy breads that have less than 80 mg of sodium per slice. Cooking  Eat more home-cooked food and less restaurant, buffet, and fast food.  Avoid adding salt when cooking. Use salt-free seasonings or herbs instead of table salt or sea salt. Check with your health care provider or pharmacist before using salt substitutes.  Cook with plant-based oils, such as canola, sunflower, or olive oil. Meal planning  When eating at a restaurant, ask that your food be prepared with less salt or no salt, if possible.  Avoid foods that contain MSG (monosodium glutamate). MSG is sometimes added to [place] food, bouillon, and some canned foods. What foods are recommended? The items listed may not be a complete list. Talk with your dietitian about what dietary choices are best for you. Grains Low-sodium cereals, including oats, puffed wheat and rice, and shredded wheat. Low-sodium crackers. Unsalted rice. Unsalted pasta. Low-sodium bread. Whole-grain breads and whole-grain pasta. Vegetables Fresh or frozen vegetables. "No salt added" canned vegetables. "No salt added" tomato sauce and paste. Low-sodium or reduced-sodium tomato and vegetable juice. Fruits Fresh, frozen, or canned fruit. Fruit juice. Meats and other protein foods Fresh or frozen (no salt added) meat, poultry, seafood, and fish. Low-sodium canned tuna and salmon. Unsalted nuts. Dried peas, beans, and lentils without added salt. Unsalted canned beans. Eggs. Unsalted nut butters. Dairy Milk. Soy milk. Cheese that is naturally low in sodium, such as ricotta cheese, fresh mozzarella, or Swiss cheese Low-sodium or reduced-sodium cheese. Cream cheese. Yogurt. Fats and oils Unsalted butter. Unsalted margarine with no trans fat. Vegetable oils such as  canola or olive oils. Seasonings and other foods Fresh and dried herbs and spices. Salt-free seasonings. Low-sodium mustard and ketchup. Sodium-free salad dressing.  Sodium-free light mayonnaise. Fresh or refrigerated horseradish. Lemon juice. Vinegar. Homemade, reduced-sodium, or low-sodium soups. Unsalted popcorn and pretzels. Low-salt or salt-free chips. What foods are not recommended? The items listed may not be a complete list. Talk with your dietitian about what dietary choices are best for you. Grains Instant hot cereals. Bread stuffing, pancake, and biscuit mixes. Croutons. Seasoned rice or pasta mixes. Noodle soup cups. Boxed or frozen macaroni and cheese. Regular salted crackers. Self-rising flour. Vegetables Sauerkraut, pickled vegetables, and relishes. Olives. [place] fries. Onion rings. Regular canned vegetables (not low-sodium or reduced-sodium). Regular canned tomato sauce and paste (not low-sodium or reduced-sodium). Regular tomato and vegetable juice (not low-sodium or reduced-sodium). Frozen vegetables in sauces. Meats and other protein foods Meat or fish that is salted, canned, smoked, spiced, or pickled. Bacon, ham, sausage, hotdogs, corned beef, chipped beef, packaged lunch meats, salt pork, jerky, pickled herring, anchovies, regular canned tuna, sardines, salted nuts. Dairy Processed cheese and cheese spreads. Cheese curds. Blue cheese. Feta cheese. String cheese. Regular cottage cheese. Buttermilk. Canned milk. Fats and oils Salted butter. Regular margarine. Ghee. Bacon fat. Seasonings and other foods Onion salt, garlic salt, seasoned salt, table salt, and sea salt. Canned and packaged gravies. Worcestershire sauce. Tartar sauce. Barbecue sauce. Teriyaki sauce. Soy sauce, including reduced-sodium. Steak sauce. Fish sauce. Oyster sauce. Cocktail sauce. Horseradish that you find on the shelf. Regular ketchup and mustard. Meat flavorings and tenderizers. Bouillon cubes. Hot sauce and Tabasco sauce. Premade or packaged marinades. Premade or packaged taco seasonings. Relishes. Regular salad dressings. Salsa. Potato and tortilla chips. Corn chips  and puffs. Salted popcorn and pretzels. Canned or dried soups. Pizza. Frozen entrees and pot pies. Summary  Eating less sodium can help lower your blood pressure, reduce swelling, and protect your heart, liver, and kidneys.  Most people on this plan should limit their sodium intake to 1,500-2,000 mg (milligrams) of sodium each day.  Canned, boxed, and frozen foods are high in sodium. Restaurant foods, fast foods, and pizza are also very high in sodium. You also get sodium by adding salt to food.  Try to cook at home, eat more fresh fruits and vegetables, and eat less fast food, canned, processed, or prepared foods. This information is not intended to replace advice given to you by your health care provider. Make sure you discuss any questions you have with your health care provider. Document Revised: 09/13/2017 Document Reviewed: 09/24/2016 Elsevier Patient Education  [place] B/P

## 2019-10-29 LAB — BASIC METABOLIC PANEL
BUN/Creatinine Ratio: 20 (ref 10–24)
BUN: 21 mg/dL (ref 8–27)
CO2: 29 mmol/L (ref 20–29)
Calcium: 9.6 mg/dL (ref 8.6–10.2)
Chloride: 100 mmol/L (ref 96–106)
Creatinine, Ser: 1.05 mg/dL (ref 0.76–1.27)
GFR calc Af Amer: 79 mL/min/{1.73_m2} (ref >59)
GFR calc non Af Amer: 69 mL/min/{1.73_m2} (ref >59)
Glucose: 129 mg/dL — ABNORMAL HIGH (ref 65–99)
Potassium: 4.6 mmol/L (ref 3.5–5.2)
Sodium: 141 mmol/L (ref 134–144)

## 2019-10-29 LAB — CBC
Hematocrit: 42.7 % (ref 37.5–51.0)
Hemoglobin: 15.3 g/dL (ref 13.0–17.7)
MCH: 32.8 pg (ref 26.6–33.0)
MCHC: 35.8 g/dL — ABNORMAL HIGH (ref 31.5–35.7)
MCV: 91 fL (ref 79–97)
Platelets: 248 10*3/uL (ref 150–450)
RBC: 4.67 x10E6/uL (ref 4.14–5.80)
RDW: 14.4 % (ref 11.6–15.4)
WBC: 8.8 10*3/uL (ref 3.4–10.8)

## 2019-11-05 ENCOUNTER — Other Ambulatory Visit: Payer: Self-pay | Admitting: Internal Medicine

## 2019-11-05 NOTE — Telephone Encounter (Signed)
Prescription refill request for Eliquis received.  Last office visit: 10/28/2019, Lenze Scr: 1.05, 10/28/2019 Age: 79 y.o. Weight: 111.1 kg  Prescription refill sent.

## 2019-11-11 ENCOUNTER — Other Ambulatory Visit: Payer: Self-pay

## 2019-11-11 ENCOUNTER — Ambulatory Visit: Payer: Medicare Other | Admitting: Cardiovascular Disease

## 2019-11-11 ENCOUNTER — Encounter: Payer: Self-pay | Admitting: Cardiovascular Disease

## 2019-11-11 VITALS — BP 158/72 | HR 56 | Temp 97.7°F | Ht 70.5 in | Wt 242.4 lb

## 2019-11-11 DIAGNOSIS — I2510 Atherosclerotic heart disease of native coronary artery without angina pectoris: Secondary | ICD-10-CM | POA: Diagnosis not present

## 2019-11-11 DIAGNOSIS — I10 Essential (primary) hypertension: Secondary | ICD-10-CM

## 2019-11-11 DIAGNOSIS — I480 Paroxysmal atrial fibrillation: Secondary | ICD-10-CM | POA: Diagnosis not present

## 2019-11-11 DIAGNOSIS — G4733 Obstructive sleep apnea (adult) (pediatric): Secondary | ICD-10-CM

## 2019-11-11 DIAGNOSIS — Z7901 Long term (current) use of anticoagulants: Secondary | ICD-10-CM

## 2019-11-11 DIAGNOSIS — Z9989 Dependence on other enabling machines and devices: Secondary | ICD-10-CM

## 2019-11-11 MED ORDER — FUROSEMIDE 40 MG PO TABS: 40.0000 mg | ORAL_TABLET | Freq: Every day | ORAL | 3 refills | Status: DC

## 2019-11-11 NOTE — Progress Notes (Signed)
Patient ID: Lucas Dudley, male   DOB: Nov 07, 1940, 79 y.o.   MRN: [id number]     HPI: Lucas Dudley, is a 79 y.o. male who I had seen in 2016 for sleep evaluation.  I have not seen him since.  He is now referred back for sleep evaluation with recent CPAP machine malfunction.   [name] has established CAD  and underwent cardiac catheterization by Dr. Gwenlyn Found on 03/17/2015 which showed 100% ostial diagonal 1 occlusion, 50% proximal LAD to mid LAD stenosis and 40% PDA stenosis for which medical therapy was recommended.  The patient has an ejection fraction of 25-35% and medical therapy was recommended.  He had issues with recurrent paroxysmal atrial fibrillation and recently underwent DC cardioversion which initially was successful.  Subsequent to the patient has developed recurrent atrial fibrillation.  He has seen Dr. Caryl Comes in follow-up and will be scheduled to undergo [facility] therapy.  During his hospitalization was noted that 75% of his pulses occurred during the sleeping hours.  The patient has been previously diagnosed as having severe obstructive sleep apnea.  In April 2012 a diagnostic polysomnogram demonstrated an AHI of approximately 90/h.  He had severe positional component.  AHI during supine sleep was 111/h per hour.  He had significant oxygen desaturation to 71% with non-REM sleep and 53% with REM sleep and had evidence for loud snoring.  The patient apparently underwent a CPAP titration trial only very briefly attempted CPAP but had difficulty with the mask at the time and stop using treatment.  I saw the patient in July 2016 and at that time he was not on therapy.  I recommended reevaluation with a split-night protocol. In September 2016 he underwent a split-night sleep study and again was found to have severe sleep apnea with an AHI 71.5/h, RDI 79.5/h, AHI during REM sleep 72/h and with supine sleep 71.5/h.  Oxygen nadir was 83%.  He underwent CPAP titration and CPAP auto therapy was  recommended at a range of 8 to 20 m of water pressure with heated humidification.  He put a nasal pillow type mask.  Oral venting was noted and chinstrap was recommended.  At the time the patient often would have to sleep in a recliner.  He would go to bed at 10 PM and wake up at 7 AM but will typically have nocturia at least 3-4 times per night, was apnea and was snoring loudly.  He also had noticed nocturnal palpitations.  The patient has a history of PAF is status post ablation, and has a history of hypertension, CAD, hyperlipidemia in addition to his obstructive sleep apnea.  He had recently been evaluated by [name], PA-C and mentioned that his CPAP is not working.  He sees Dr. Meda Coffee for primary cardiology care.  He has noticed leg swelling left foot greater than right.  He states that he is CPAP machine has required multiple repairs and has had to glue and tape different portions of his machine.  A download was obtained from December 28 through November 10, 2019 which shows 100% compliance with 8 hours and 8 minutes of CPAP use per night.  AHI is 2.3 with a 95th percentile pressure at 10.4 with maximum average pressure 11.6.  He does have significant mask leak.  His previous DME company is out of business and may have been [facility].  He is in need for new supplies and new mask.  He now presents for follow-up sleep evaluation.  Past  Medical History:  Diagnosis Date  . Arthritis    "hands probably" (05/04/2015)  . CAD (coronary artery disease)    a. Cath 03/17/15 showing 100% ostial D1, 50% prox LAD to mid LAD, 40% RPDA stenosis. Med rx.  . Glaucoma   . History of gout   . Hyperlipidemia   . Hypertension   . Kidney stones    "found during prostate OR; still in there as far as I know" (05/04/2015)  . Melanoma of neck ([place])    "right; came back IV; no chemo"  . NICM (nonischemic cardiomyopathy) ([place])   . OSA on CPAP 2012  . Paroxysmal atrial fibrillation (HCC)    a. 04/2015 Tikosyn  loading, DCCV x 1;  b. CHA2DS2VASc = 5-->Eliquis.  . Prostate cancer ([place])   . Type II diabetes mellitus ([place])     Past Surgical History:  Procedure Laterality Date  . ABDOMINAL HERNIA REPAIR     w/mesh  . CARDIAC CATHETERIZATION N/A 03/17/2015   Procedure: Left Heart Cath and Coronary Angiography;  Surgeon: Lorretta Harp, MD;  Location: [place] [facility];  Service: Cardiovascular;  Laterality: N/A;  . carotid doppler  09/17/2008   rigt and left ICAs 0-49%;mildly  abnormal  . COLONOSCOPY N/A 05/16/2017   Procedure: COLONOSCOPY;  Surgeon: Rogene Houston, MD;  [facility];  Service: Endoscopy;  Laterality: N/A;  930  . DOPPLER ECHOCARDIOGRAPHY  05/25/2009   EF 50-55%,LA mildly dilated, LV function normal  . ELECTROPHYSIOLOGIC STUDY N/A 04/05/2015   Procedure: Cardioversion;  Surgeon: Sanda Klein, MD;  Location: [place] [facility];  Service: Cardiovascular;  Laterality: N/A;  . ELECTROPHYSIOLOGIC STUDY N/A 09/06/2015   Procedure: Atrial Fibrillation Ablation;  Surgeon: Thompson Grayer, MD;  Location: [place] [facility];  Service: Cardiovascular;  Laterality: N/A;  . ELECTROPHYSIOLOGIC STUDY N/A 07/12/2016   redo afib ablation by Dr Rayann Heman  . EXCISIONAL HEMORRHOIDECTOMY     "inside and out"  . FINE NEEDLE ASPIRATION Right    knee; "drew ~ 1 quart off"  . HERNIA REPAIR    . LAPAROSCOPIC CHOLECYSTECTOMY    . MELANOMA EXCISION Right    "neck"  . NM MYOCAR PERF WALL MOTION  02/21/2012   EF 61% ,EXERCISE 7 METS. exercise stopped due to wheezing and shortness of breathe  . POLYPECTOMY  05/16/2017   Procedure: POLYPECTOMY;  Surgeon: Rogene Houston, MD;  [facility];  Service: Endoscopy;;  colon  . PROSTATECTOMY    . SHOULDER OPEN ROTATOR CUFF REPAIR Right X 2  . TEE WITHOUT CARDIOVERSION N/A 09/05/2015   Procedure: TRANSESOPHAGEAL ECHOCARDIOGRAM (TEE);  Surgeon: Sueanne Margarita, MD;  Location[facility] ENDOSCOPY;  Service: Cardiovascular;  Laterality: N/A;    Allergies    Allergen Reactions  . Tape Rash and Other (See Comments)    Causes skin redness, Use paper tape only.    Current Outpatient Medications  Medication Sig Dispense Refill  . Carboxymethylcellul-Glycerin (LUBRICATING EYE DROPS OP) Apply 1 drop to eye daily as needed (itching eyes).    . carvedilol (COREG) 12.5 MG tablet 1 TAB TWICE DAILY 180 tablet 3  . ELIQUIS 5 MG TABS tablet TAKE 1 TABLET TWICE A DAY 180 tablet 1  . furosemide (LASIX) 40 MG tablet Take 1 tablet (40 mg total) by mouth daily. 90 tablet 3  . gemfibrozil (LOPID) 600 MG tablet Take 600 mg by mouth at bedtime.     [name] glimepiride (AMARYL) 2 MG tablet Take 2 mg by mouth  2 (two) times daily.     . Ibuprofen-diphenhydrAMINE HCl (ADVIL PM) 200-25 MG CAPS Take 1 capsule by mouth at bedtime.    . Insulin Glargine (BASAGLAR KWIKPEN) 100 UNIT/ML SOPN Inject 40 Units into the skin at bedtime.     [name] latanoprost (XALATAN) 0.005 % ophthalmic solution Place 1 drop into both eyes at bedtime.    [name] lisinopril (ZESTRIL) 40 MG tablet Take 1 tablet (40 mg total) by mouth daily. 90 tablet 2  . metFORMIN (GLUCOPHAGE) 500 MG tablet Take 1,000 mg by mouth 2 (two) times daily.    . valACYclovir (VALTREX) 1000 MG tablet Take 1,000 mg by mouth daily.     No current facility-administered medications for this visit.    Social History   Socioeconomic History  . Marital status: Married    Spouse name: Not on file  . Number of children: Not on file  . Years of education: Not on file  . Highest education level: Not on file  Occupational History  . Occupation: Retired  Tobacco Use  . Smoking status: Former Smoker    Packs/day: 2.00    Years: 27.00    Pack years: 54.00    Types: Cigarettes    Quit date: 10/31/1983    Years since quitting: 36.0  . Smokeless tobacco: Never Used  Substance and Sexual Activity  . Alcohol use: No    Alcohol/week: 0.0 standard drinks    Comment: "used to drink; stopped ~ 2008"  . Drug use: No  . Sexual activity: Not  Currently  Other Topics Concern  . Not on file  Social History Narrative   Lives in [place], [place] with wife.   Social Determinants of Health   Financial Resource Strain:   . Difficulty of Paying Living Expenses: Not on file  Food Insecurity:   . Worried About Charity fundraiser in the Last Year: Not on file  . Ran Out of Food in the Last Year: Not on file  Transportation Needs:   . Lack of Transportation (Medical): Not on file  . Lack of Transportation (Non-Medical): Not on file  Physical Activity:   . Days of Exercise per Week: Not on file  . Minutes of Exercise per Session: Not on file  Stress:   . Feeling of Stress : Not on file  Social Connections:   . Frequency of Communication with Friends and Family: Not on file  . Frequency of Social Gatherings with Friends and Family: Not on file  . Attends Religious Services: Not on file  . Active Member of Clubs or Organizations: Not on file  . Attends Archivist Meetings: Not on file  . Marital Status: Not on file  Intimate Partner Violence:   . Fear of Current or Ex-Partner: Not on file  . Emotionally Abused: Not on file  . Physically Abused: Not on file  . Sexually Abused: Not on file    Family History  Problem Relation Age of Onset  . Heart disease Mother   . Lung cancer Mother   . Heart attack Mother 71  . Stroke Brother   . Healthy Daughter   . Diabetes Father   . Heart disease Father      ROS General: Negative; No fevers, chills, or night sweats HEENT: Negative; No changes in vision or hearing, sinus congestion, difficulty swallowing Pulmonary: Negative; No cough, wheezing, shortness of breath, hemoptysis Cardiovascular: History of PAF, status post ablation; CAD leg swelling GI: Negative; No nausea, vomiting, diarrhea, or  abdominal pain GU: Negative; No dysuria, hematuria, or difficulty voiding Musculoskeletal: Negative; no myalgias, joint pain, or weakness Hematologic: Negative; no easy bruising,  bleeding Endocrine: Positive for diabetes mellitus Neuro: Negative; no changes in balance, headaches Skin: Negative; No rashes or skin lesions Psychiatric: Negative; No behavioral problems, depression Sleep: Positive for severe, currently untreated sleep apnea and daytime sleepiness; no bruxism, restless legs, hypnogognic hallucinations, no cataplexy   Physical Exam BP (!) 158/72   Pulse (!) 56   Temp 97.7 F (36.5 C)   Ht 5' 10.5" (1.791 m)   Wt 242 lb 6.4 oz (110 kg)   SpO2 94%   BMI 34.29 kg/m    Repeat blood pressure by me 156/78  Wt Readings from Last 3 Encounters:  11/11/19 242 lb 6.4 oz (110 kg)  10/28/19 245 lb (111.1 kg)  10/21/19 247 lb 12.8 oz (112.4 kg)   General: Alert, oriented, no distress.  Skin: normal turgor, no rashes, warm and dry HEENT: Normocephalic, atraumatic. Pupils equal round and reactive to light; sclera anicteric; extraocular muscles intact;  Nose without nasal septal hypertrophy Mouth/Parynx benign; Mallinpatti scale 3 Neck: No JVD, no carotid bruits; normal carotid upstroke Lungs: clear to ausculatation and percussion; no wheezing or rales Chest wall: without tenderness to palpitation Heart: PMI not displaced, RRR, s1 s2 normal, 1/6 systolic murmur, no diastolic murmur, no rubs, gallops, thrills, or heaves Abdomen: soft, nontender; no hepatosplenomehaly, BS+; abdominal aorta nontender and not dilated by palpation. Back: no CVA tenderness Pulses 2+ Musculoskeletal: full range of motion, normal strength, no joint deformities Extremities: Left foot edema no clubbing cyanosis or edema, Homan's sign negative  Neurologic: grossly nonfocal; Cranial nerves grossly wnl Psychologic: Normal mood and affect  ECG (independently read by me): Sinus bradycardia with sinus arrhythmia, PACs, left axis deviation, poor anterior R wave progression.  QTc interval 430 ms.  PR interval 232 ms consistent with first-degree AV block  July 2016 ECG (independently read  by me): Atrial fibrillation, 81 bpm.  Incomplete right bundle branch block.  Poor R wave progression  LABS:  BMP Latest Ref Rng & Units 10/28/2019 10/18/2018 10/18/2018  Glucose 65 - 99 mg/dL 129(H) 213(H) 217(H)  BUN 8 - 27 mg/dL 21 23 24(H)  Creatinine 0.76 - 1.27 mg/dL 1.05 0.90 1.01  BUN/Creat Ratio 10 - 24 20 - -  Sodium 134 - 144 mmol/L 141 136 135  Potassium 3.5 - 5.2 mmol/L 4.6 4.2 4.1  Chloride 96 - 106 mmol/L 100 99 98  CO2 20 - 29 mmol/L 29 - 25  Calcium 8.6 - 10.2 mg/dL 9.6 - 9.1    Hepatic Function Latest Ref Rng & Units 10/18/2018 05/08/2016  Total Protein 6.5 - 8.1 g/dL 8.2(H) 7.5  Albumin 3.5 - 5.0 g/dL 4.5 4.2  AST 15 - 41 U/L 21 15  ALT 0 - 44 U/L 17 13(L)  Alk Phosphatase 38 - 126 U/L 52 43  Total Bilirubin 0.3 - 1.2 mg/dL 0.6 1.7(H)     CBC Latest Ref Rng & Units 10/28/2019 10/18/2018 10/18/2018  WBC 3.4 - 10.8 x10E3/uL 8.8 - 18.6(H)  Hemoglobin 13.0 - 17.7 g/dL 15.3 15.0 14.3  Hematocrit 37.5 - 51.0 % 42.7 44.0 43.1  Platelets 150 - 450 x10E3/uL 248 - 222   Lab Results  Component Value Date   TSH 2.663 03/11/2015     Lipid Panel  No results found for: CHOL, TRIG, HDL, CHOLHDL, VLDL, LDLCALC, LDLDIRECT   RADIOLOGY: No results found.  IMPRESSION:  1. OSA on  CPAP   2. Paroxysmal atrial fibrillation (HCC)   3. Coronary artery disease involving native coronary artery of native heart without angina pectoris   4. Essential hypertension   5. CAD in native artery   6. Chronic anticoagulation     ASSESSMENT AND PLAN: Mr. Kurtis Anastasia is a 79 year old male who has a history of coronary artery disease and reduced LV function, felt to be a nonischemic cardiomyopathy, hypertension, hyperlipidemia, and type II diabetes mellitus.  He has a history of atrial fibrillation and apparently had undergone ablation. The patient has a history of severe obstructive sleep apnea was originally demonstrated in April 2012  with significant oxygen desaturation to a nadir of 53% during  REM sleep.  He did not use CPAP therapy for over 4 years but when I saw him in 2016 I felt his severe sleep apnea was playing a significant role in his atrial fibrillation.  He suddenly had undergone ablation.  He has been on CPAP therapy since November 2016 and initial DME company is no longer in business.  States he has had machine malfunction and has had to glue and tape his machine on multiple times.  He also has had issues with his current mask and has not been able to get new supplies.  He is using therapy and his most recent download confirms 100% compliance with a residual AHI of 2.3 at a 95th percentile pressure of 10.4.  I discussed new mask technology with him.  I will redirect him to [facility] for DME company and have recommended a trial of the ResMed AirFit N 30i mask.  In the past he did have some issues with oral venting and he may benefit from a chinstrap.  I will also recommend that he obtain a new CPAP machine particularly with his current machine having undergone multiple repairs.  I would recommend a ResMed air sense 10 CPAP auto unit with his current settings.  On exam, he does have significant left feet swelling.  I have recommended titration of his furosemide from 20 mg up to 40 mg.  He continues to be on carvedilol 12.5 mg twice a day in addition to lisinopril 40 mg daily.  Blood pressure today was elevated.  He may require additional medication if blood pressure continues to be elevated after his furosemide increased.  He is diabetic on glimepiride and Metformin.  He is on anticoagulation with Eliquis.  I have recommended follow-up cardiology evaluation with Dr. Meda Coffee.  I will see him in 3 months for follow-up sleep evaluation after hopefully he receives a new machine and for additional recommendations.  Time spent: 30 minutes  Troy Sine, MD, [facility]  11/13/2019 2:38 PM

## 2019-11-11 NOTE — Patient Instructions (Signed)
Medication Instructions:  INCREASE LASIX TO 40MG  DAILY *If you need a refill on your cardiac medications before your next appointment, please call your pharmacy*  Follow-Up: Door DR. Meda Coffee

## 2019-11-13 ENCOUNTER — Encounter: Payer: Self-pay | Admitting: Cardiovascular Disease

## 2019-11-17 ENCOUNTER — Telehealth: Payer: Self-pay | Admitting: *Deleted

## 2019-11-17 NOTE — Telephone Encounter (Signed)
Informed patient I have been working to get the needed documentation for his new CPAP machine. If I do not have everything needed he will need to have another sleep study. Patient was not happy about this, but voiced understanding. Records and order for replacement CPAP machine sent to [facility].

## 2019-11-20 ENCOUNTER — Other Ambulatory Visit: Payer: Self-pay

## 2019-11-20 ENCOUNTER — Encounter: Payer: Self-pay | Admitting: Cardiology

## 2019-11-20 ENCOUNTER — Ambulatory Visit: Payer: Medicare Other | Admitting: Cardiology

## 2019-11-20 VITALS — BP 152/82 | HR 54 | Ht 70.0 in | Wt 246.8 lb

## 2019-11-20 DIAGNOSIS — I480 Paroxysmal atrial fibrillation: Secondary | ICD-10-CM | POA: Diagnosis not present

## 2019-11-20 DIAGNOSIS — I10 Essential (primary) hypertension: Secondary | ICD-10-CM | POA: Diagnosis not present

## 2019-11-20 DIAGNOSIS — E785 Hyperlipidemia, unspecified: Secondary | ICD-10-CM

## 2019-11-20 NOTE — Patient Instructions (Signed)
Medication Instructions:   Your physician recommends that you continue on your current medications as directed. Please refer to the Current Medication list given to you today.  *If you need a refill on your cardiac medications before your next appointment, please call your pharmacy*    Follow-Up: At [facility], you and your health needs are our priority.  As part of our continuing mission to provide you with exceptional heart care, we have created designated Provider Care Teams.  These Care Teams include your primary Cardiologist (physician) and Advanced Practice Providers (APPs -  Physician Assistants and Nurse Practitioners) who all work together to provide you with the care you need, when you need it.  Your next appointment:   6 month(s)  The format for your next appointment:   In Person  Provider:   Katarina Nelson, MD    

## 2019-11-20 NOTE — Progress Notes (Signed)
Cardiology Office Note    Date:  11/20/2019   ID:  Lucas Dudley, DOB Jul 27, 1941, MRN [id number]  PCP:  [name], MD  Cardiologist: Quay Burow, MD EPS: Thompson Grayer, MD  Reason for visit: 2-week follow-up  History of Present Illness:  Lucas Dudley is a 80 y.o. male with history of PAF CHA2DS2-VASc equals 5 on Eliquis status post ablation 2016, CAD cath in 2016 total ostial diagonal 1, 50% proximal to mid LAD, 40% RPDA treated medically, hypertension, HLD, OSA on CPAP.  10/28/2019 patient comes in for f/u. BP running high here and at home despite increase of lisinopril. Wife in hospital with severe dementia trying to adjust meds and then nursing home. He's not able to see her and under a lot of stress.Eats most meals out. He said 2 weeks ago he woke up with Afib going fast but eventually  Fell asleep. CPAP isn't working.   11/20/2019 -this is 2-week follow-up, the patient is tolerating new medication well without any episodes of orthostatic hypotension, he denies any chest pain shortness of breath and has not had recent episodes of palpitations.  He gets mild lower extremity edema during today's results per morning.  Past Medical History:  Diagnosis Date  . Arthritis    "hands probably" (05/04/2015)  . CAD (coronary artery disease)    a. Cath 03/17/15 showing 100% ostial D1, 50% prox LAD to mid LAD, 40% RPDA stenosis. Med rx.  . Glaucoma   . History of gout   . Hyperlipidemia   . Hypertension   . Kidney stones    "found during prostate OR; still in there as far as I know" (05/04/2015)  . Melanoma of neck ([place])    "right; came back IV; no chemo"  . NICM (nonischemic cardiomyopathy) ([place])   . OSA on CPAP 2012  . Paroxysmal atrial fibrillation (HCC)    a. 04/2015 Tikosyn loading, DCCV x 1;  b. CHA2DS2VASc = 5-->Eliquis.  . Prostate cancer ([place])   . Type II diabetes mellitus ([place])     Past Surgical History:  Procedure Laterality Date  . ABDOMINAL HERNIA REPAIR     w/mesh    . CARDIAC CATHETERIZATION N/A 03/17/2015   Procedure: Left Heart Cath and Coronary Angiography;  Surgeon: Lorretta Harp, MD;  Location: [place] [facility];  Service: Cardiovascular;  Laterality: N/A;  . carotid doppler  09/17/2008   rigt and left ICAs 0-49%;mildly  abnormal  . COLONOSCOPY N/A 05/16/2017   Procedure: COLONOSCOPY;  Surgeon: Rogene Houston, MD;  [facility];  Service: Endoscopy;  Laterality: N/A;  930  . DOPPLER ECHOCARDIOGRAPHY  05/25/2009   EF 50-55%,LA mildly dilated, LV function normal  . ELECTROPHYSIOLOGIC STUDY N/A 04/05/2015   Procedure: Cardioversion;  Surgeon: Sanda Klein, MD;  Location: [place];  Service: Cardiovascular;  Laterality: N/A;  . ELECTROPHYSIOLOGIC STUDY N/A 09/06/2015   Procedure: Atrial Fibrillation Ablation;  Surgeon: Thompson Grayer, MD;  Location: [facility];  Service: Cardiovascular;  Laterality: N/A;  . ELECTROPHYSIOLOGIC STUDY N/A 07/12/2016   redo afib ablation by Dr Rayann Heman  . EXCISIONAL HEMORRHOIDECTOMY     "inside and out"  . FINE NEEDLE ASPIRATION Right    knee; "drew ~ 1 quart off"  . HERNIA REPAIR    . LAPAROSCOPIC CHOLECYSTECTOMY    . MELANOMA EXCISION Right    "neck"  . NM MYOCAR PERF WALL MOTION  02/21/2012   EF 61% ,EXERCISE 7 METS. exercise stopped due to  wheezing and shortness of breathe  . POLYPECTOMY  05/16/2017   Procedure: POLYPECTOMY;  Surgeon: Rogene Houston, MD;  [facility];  Service: Endoscopy;;  colon  . PROSTATECTOMY    . SHOULDER OPEN ROTATOR CUFF REPAIR Right X 2  . TEE WITHOUT CARDIOVERSION N/A 09/05/2015   Procedure: TRANSESOPHAGEAL ECHOCARDIOGRAM (TEE);  Surgeon: Sueanne Margarita, MD;  [facility];  Service: Cardiovascular;  Laterality: N/A;    Current Medications: Current Meds  Medication Sig  . Carboxymethylcellul-Glycerin (LUBRICATING EYE DROPS OP) Apply 1 drop to eye daily as needed (itching eyes).  . carvedilol (COREG) 12.5 MG tablet 1 TAB TWICE DAILY   . ELIQUIS 5 MG TABS tablet TAKE 1 TABLET TWICE A DAY  . furosemide (LASIX) 40 MG tablet Take 1 tablet (40 mg total) by mouth daily.  [name] gemfibrozil (LOPID) 600 MG tablet Take 600 mg by mouth at bedtime.   [name] glimepiride (AMARYL) 2 MG tablet Take 2 mg by mouth 2 (two) times daily.   . Ibuprofen-diphenhydrAMINE HCl (ADVIL PM) 200-25 MG CAPS Take 1 capsule by mouth at bedtime.  . Insulin Glargine (BASAGLAR KWIKPEN) 100 UNIT/ML SOPN Inject 40 Units into the skin at bedtime.   [name] latanoprost (XALATAN) 0.005 % ophthalmic solution Place 1 drop into both eyes at bedtime.  [name] lisinopril (ZESTRIL) 40 MG tablet Take 1 tablet (40 mg total) by mouth daily.  . metFORMIN (GLUCOPHAGE-XR) 500 MG 24 hr tablet Take 1,000 mg by mouth 2 (two) times daily.  . valACYclovir (VALTREX) 1000 MG tablet Take 1,000 mg by mouth daily.     Allergies:   Tape   Social History   Socioeconomic History  . Marital status: Married    Spouse name: Not on file  . Number of children: Not on file  . Years of education: Not on file  . Highest education level: Not on file  Occupational History  . Occupation: Retired  Tobacco Use  . Smoking status: Former Smoker    Packs/day: 2.00    Years: 27.00    Pack years: 54.00    Types: Cigarettes    Quit date: 10/31/1983    Years since quitting: 36.0  . Smokeless tobacco: Never Used  Substance and Sexual Activity  . Alcohol use: No    Alcohol/week: 0.0 standard drinks    Comment: "used to drink; stopped ~ 2008"  . Drug use: No  . Sexual activity: Not Currently  Other Topics Concern  . Not on file  Social History Narrative   Lives in [place], [place] with wife.   Social Determinants of Health   Financial Resource Strain:   . Difficulty of Paying Living Expenses: Not on file  Food Insecurity:   . Worried About Charity fundraiser in the Last Year: Not on file  . Ran Out of Food in the Last Year: Not on file  Transportation Needs:   . Lack of Transportation (Medical): Not on file   . Lack of Transportation (Non-Medical): Not on file  Physical Activity:   . Days of Exercise per Week: Not on file  . Minutes of Exercise per Session: Not on file  Stress:   . Feeling of Stress : Not on file  Social Connections:   . Frequency of Communication with Friends and Family: Not on file  . Frequency of Social Gatherings with Friends and Family: Not on file  . Attends Religious Services: Not on file  . Active Member of Clubs or Organizations: Not on file  .  Attends Archivist Meetings: Not on file  . Marital Status: Not on file     Family History:  The patient's   family history includes Diabetes in his father; Healthy in his daughter; Heart attack (age of onset: 72) in his mother; Heart disease in his father and mother; Lung cancer in his mother; Stroke in his brother.   ROS:   Please see the history of present illness.    ROS All other systems reviewed and are negative.   PHYSICAL EXAM:   VS:  BP (!) 152/82   Pulse (!) 54   Ht 5\' 10"  (1.778 m)   Wt 246 lb 12.8 oz (111.9 kg)   SpO2 96%   BMI 35.41 kg/m   Physical Exam  GEN: Obese, in no acute distress  Neck: no JVD, carotid bruits, or masses Cardiac:RRR; no murmurs, rubs, or gallops  Respiratory:  clear to auscultation bilaterally, normal work of breathing GI: soft, nontender, nondistended, + BS Ext: bilateral leg edema Neuro:  Alert and Oriented x 3 Psych: euthymic mood, full affect  Wt Readings from Last 3 Encounters:  11/20/19 246 lb 12.8 oz (111.9 kg)  11/11/19 242 lb 6.4 oz (110 kg)  10/28/19 245 lb (111.1 kg)    Studies/Labs Reviewed:   EKG:  EKG is not ordered today.     Recent Labs: 10/28/2019: BUN 21; Creatinine, Ser 1.05; Hemoglobin 15.3; Platelets 248; Potassium 4.6; Sodium 141   Lipid Panel No results found for: CHOL, TRIG, HDL, CHOLHDL, VLDL, LDLCALC, LDLDIRECT  Additional studies/ records that were reviewed today include:  Echo 1/2019Study Conclusions   - Left ventricle: The  cavity size was normal. Wall thickness was   increased in a pattern of severe LVH. Systolic function was   normal. The estimated ejection fraction was in the range of 60%   to 65%. Wall motion was normal; there were no regional wall   motion abnormalities. Features are consistent with a pseudonormal   left ventricular filling pattern, with concomitant abnormal   relaxation and increased filling pressure (grade 2 diastolic   dysfunction).   ASSESSMENT:    1. Paroxysmal atrial fibrillation (HCC)   2. Hyperlipidemia, unspecified hyperlipidemia type   3. Essential hypertension      PLAN:  In order of problems listed above:  Paroxysmal atrial fibrillation CHA2DS2-VASc equals 5 on Eliquis. Tikosyn stopped, he remains in sinus rhythm on carvedilol 12.5 mg p.o. twice daily.  CAD medical management see description above-no angina.   Essential hypertension BP high-probably a combination of stress, eating out, CPAP not working properly and weight.  His lisinopril and carvedilol were decreased, repeat blood pressure today 142/82, again she is advised to avoid salt.  We will continue same management for now.  Initiation of the new CPAP machine might help.  OSA on CPAP but not working properly-he has been just evaluated by Dr. Claiborne Billings new machine has been ordered.  Hyperlipidemia  On lopid managed by PCP and blood work recently He is tolerating it well.  Medication Adjustments/Labs and Tests Ordered: Current medicines are reviewed at length with the patient today.  Concerns regarding medicines are outlined above.  Medication changes, Labs and Tests ordered today are listed in the Patient Instructions below. Patient Instructions  Medication Instructions:   Your physician recommends that you continue on your current medications as directed. Please refer to the Current Medication list given to you today.  *If you need a refill on your cardiac medications before your next appointment, please  call  your pharmacy*    Follow-Up: At [facility], you and your health needs are our priority.  As part of our continuing mission to provide you with exceptional heart care, we have created designated Provider Care Teams.  These Care Teams include your primary Cardiologist (physician) and Advanced Practice Providers (APPs -  Physician Assistants and Nurse Practitioners) who all work together to provide you with the care you need, when you need it.  Your next appointment:   6 month(s)  The format for your next appointment:   In Person  Provider:   Ena Dawley, MD       Signed, Ena Dawley, MD  11/20/2019 1:56 PM    [address], [place], [place]  [address] Phone: [phone]; Fax: [phone]

## 2020-01-06 ENCOUNTER — Telehealth: Payer: Self-pay | Admitting: Cardiology

## 2020-01-06 NOTE — Telephone Encounter (Signed)
Pt calling in with shortness of breath. He states he has gained about 3 pounds overnight and is SOB at rest which worsens when he exerts himself. He has been SOB for the past two days. He says his left foot swells but that is normal for him. He denies any abnormal swelling at this time.   Pt denies any CP, lightheadedness, diaphoresis at this time.   Current  HR:58  O2 Sat: 92%   Will route to KN MD for advisement

## 2020-01-06 NOTE — Telephone Encounter (Signed)
Can you schedule him for a PA/NP visit this week? Thank you

## 2020-01-06 NOTE — Telephone Encounter (Signed)
Spoke with the pt and informed him that per Dr. Meda Coffee, she would like for him to be seen by an Extender in the office this week, for complaints mentioned. Informed Dr. Meda Coffee there was no availability this week with an APP, so she advised that open slot for next Tuesday was ok to add on [employer] PA-C schedule.  Scheduled the pt to come in and see Richardson Dopp PA-C for next Tuesday 3/30 at 3:15 pm.  Pt aware to arrive 15 mins prior to that appt.  Advised the pt to avoid salt all together until next week.  Advised pt to do a dry weight on himself, and log this.  Pt education provided on how to do this.  Pt states his weight today was 240 lbs, and that was after breakfast time.  Pts last documented weight in his chart was 246 lbs.  Advised the pt that if his symptoms worsen, he needs to go to the ER or call 911.  Alarming symptoms that warrant emergency care was discussed with the pt.  Pt verbalized understanding and agrees with this plan.

## 2020-01-06 NOTE — Telephone Encounter (Signed)
New message   Pt c/o Shortness Of Breath: STAT if SOB developed within the last 24 hours or pt is noticeably SOB on the phone  1. Are you currently SOB (can you hear that pt is SOB on the phone)?yes   2. How long have you been experiencing SOB? 2 to 3 days per patient  3. Are you SOB when sitting or when up moving around? Moving around   4. Are you currently experiencing any other symptoms? No

## 2020-01-12 ENCOUNTER — Ambulatory Visit: Payer: Medicare Other | Admitting: Physician Assistant

## 2020-01-12 ENCOUNTER — Encounter: Payer: Self-pay | Admitting: Physician Assistant

## 2020-01-12 ENCOUNTER — Other Ambulatory Visit: Payer: Self-pay

## 2020-01-12 VITALS — BP 140/80 | HR 58 | Ht 70.0 in | Wt 244.4 lb

## 2020-01-12 DIAGNOSIS — R0602 Shortness of breath: Secondary | ICD-10-CM

## 2020-01-12 DIAGNOSIS — I2510 Atherosclerotic heart disease of native coronary artery without angina pectoris: Secondary | ICD-10-CM

## 2020-01-12 DIAGNOSIS — I10 Essential (primary) hypertension: Secondary | ICD-10-CM

## 2020-01-12 DIAGNOSIS — R0789 Other chest pain: Secondary | ICD-10-CM

## 2020-01-12 DIAGNOSIS — I428 Other cardiomyopathies: Secondary | ICD-10-CM

## 2020-01-12 DIAGNOSIS — I480 Paroxysmal atrial fibrillation: Secondary | ICD-10-CM

## 2020-01-12 NOTE — Patient Instructions (Signed)
Medication Instructions:   Your physician recommends that you continue on your current medications as directed. Please refer to the Current Medication list given to you today.  *If you need a refill on your cardiac medications before your next appointment, please call your pharmacy*  Lab Work:  You will have labs drawn today: BMET/BNP/CBC  If you have labs (blood work) drawn today and your tests are completely normal, you will receive your results only by: [name] MyChart Message (if you have MyChart) OR . A paper copy in the mail If you have any lab test that is abnormal or we need to change your treatment, we will call you to review the results.  Testing/Procedures:  Your physician has requested that you have an echocardiogram. Echocardiography is a painless test that uses sound waves to create images of your heart. It provides your doctor with information about the size and shape of your heart and how well your heart's chambers and valves are working. This procedure takes approximately one hour. There are no restrictions for this procedure.  Your physician has requested that you have en exercise stress myoview. For further information please visit [email or web address]. Please follow instruction sheet, as given.  Follow-Up: At [facility], you and your health needs are our priority.  As part of our continuing mission to provide you with exceptional heart care, we have created designated Provider Care Teams.  These Care Teams include your primary Cardiologist (physician) and Advanced Practice Providers (APPs -  Physician Assistants and Nurse Practitioners) who all work together to provide you with the care you need, when you need it.  We recommend signing up for the patient portal called "MyChart".  Sign up information is provided on this After Visit Summary.  MyChart is used to connect with patients for Virtual Visits (Telemedicine).  Patients are able to view lab/test results, encounter notes,  upcoming appointments, etc.  Non-urgent messages can be sent to your provider as well.   To learn more about what you can do with MyChart, go to [email or web address].    Your next appointment:   3-4 week(s)  The format for your next appointment:   In Person  Provider:   Ena Dawley, MD

## 2020-01-12 NOTE — Progress Notes (Signed)
Cardiology Office Note:    Date:  01/12/2020   ID:  EMMONS LATON, DOB November 10, 1940, MRN [id number]  PCP:  [name], MD  Cardiologist:  Ena Dawley, MD   Electrophysiologist:  Thompson Grayer, MD   Referring MD: [name], MD   Chief Complaint:  Shortness of Breath and Chest Pain    Patient Profile:    Lucas Dudley is a 79 y.o. male with:   Paroxysmal atrial fibrillation   S/p PVI ablation 08/2015, 06/2016 (Dr. Rayann Heman)  Coronary artery disease   D1 occluded, mod dz in LAD, RPDA tx med  HFrEF with return of normal LVF  Nonischemic cardiomyopathy (tachycardia induced)  Echo 4/16: EF 40  Echo 10/2017: EF 60-65  Diabetes mellitus   Hypertension   Hyperlipidemia   OSA on CPAP  Prior CV studies:  Echocardiogram 10/21/2017 Severe LVH, EF 60-65, normal wall motion, grade 2 diastolic dysfunction  Cardiac catheterization 03/17/2015 LAD proximal 50; D1 ostial 100 RPDA 40 EF 25-35  Myoview 02/09/2015 Inferior infarct, mild peri-infarct ischemia; low risk  Echocardiogram 02/09/2015 Mild LVH, EF 40, diffuse HK, trivial MR, mild LAE, normal RV SF, PASP 15  History of Present Illness:    Mr. Sturgell was last seen by Dr. Meda Coffee in 11/2019.  He presents today for further evaluation of shortness of breath.  He has been short of breath for the past couple of months.  It has not gotten any worse.  He notes it with minimal activity.  He has not had shortness of breath at rest.  He has noted some left-sided chest discomfort.  This is not related to activity or associated shortness of breath or nausea.  He has not had to use nitroglycerin.  He has not had orthopnea or PND.  He has chronic left ankle and foot swelling without significant change.  He has not really noticed any significant weight change.  He has not had syncope.  He occasionally wheezes but has not had cough.  He has not had any melena, hematochezia, hematuria.     Past Medical History:  Diagnosis Date  .  Arthritis    "hands probably" (05/04/2015)  . CAD (coronary artery disease)    a. Cath 03/17/15 showing 100% ostial D1, 50% prox LAD to mid LAD, 40% RPDA stenosis. Med rx.  . Glaucoma   . History of gout   . Hyperlipidemia   . Hypertension   . Kidney stones    "found during prostate OR; still in there as far as I know" (05/04/2015)  . Melanoma of neck ([place])    "right; came back IV; no chemo"  . NICM (nonischemic cardiomyopathy) ([place])   . OSA on CPAP 2012  . Paroxysmal atrial fibrillation (HCC)    a. 04/2015 Tikosyn loading, DCCV x 1;  b. CHA2DS2VASc = 5-->Eliquis.  . Prostate cancer ([place])   . Type II diabetes mellitus (HCC)     Current Medications: Current Meds  Medication Sig  . Carboxymethylcellul-Glycerin (LUBRICATING EYE DROPS OP) Apply 1 drop to eye daily as needed (itching eyes).  . carvedilol (COREG) 12.5 MG tablet 1 TAB TWICE DAILY  . dorzolamide-timolol (COSOPT) 22.3-6.8 MG/ML ophthalmic solution Place 1 drop into the left eye 2 (two) times daily.  [name] ELIQUIS 5 MG TABS tablet TAKE 1 TABLET TWICE A DAY  . furosemide (LASIX) 40 MG tablet Take 1 tablet (40 mg total) by mouth daily.  [name] gemfibrozil (LOPID) 600 MG tablet Take 600 mg by mouth at bedtime.   [name]  glimepiride (AMARYL) 2 MG tablet Take 2 mg by mouth 2 (two) times daily.   . Insulin Glargine (BASAGLAR KWIKPEN) 100 UNIT/ML SOPN Inject 40 Units into the skin at bedtime.   [name] latanoprost (XALATAN) 0.005 % ophthalmic solution Place 1 drop into both eyes at bedtime.  [name] lisinopril (ZESTRIL) 40 MG tablet Take 1 tablet (40 mg total) by mouth daily.  . metFORMIN (GLUCOPHAGE-XR) 500 MG 24 hr tablet Take 1,000 mg by mouth 2 (two) times daily.  . valACYclovir (VALTREX) 1000 MG tablet Take 1,000 mg by mouth daily.     Allergies:   Tape   Social History   Tobacco Use  . Smoking status: Former Smoker    Packs/day: 2.00    Years: 27.00    Pack years: 54.00    Types: Cigarettes    Quit date: 10/31/1983    Years since quitting: 36.2    . Smokeless tobacco: Never Used  Substance Use Topics  . Alcohol use: No    Alcohol/week: 0.0 standard drinks    Comment: "used to drink; stopped ~ 2008"  . Drug use: No     Family Hx: The patient's family history includes Diabetes in his father; Healthy in his daughter; Heart attack (age of onset: 76) in his mother; Heart disease in his father and mother; Lung cancer in his mother; Stroke in his brother.  ROS   EKGs/Labs/Other Test Reviewed:    EKG:  EKG is   ordered today.  The ekg ordered today demonstrates sinus bradycardia, HR 58, left axis deviation, first-degree AV block, PR interval 248 ms, QTC 445 ms, no significant change since prior tracing  Recent Labs: 10/28/2019: BUN 21; Creatinine, Ser 1.05; Hemoglobin 15.3; Platelets 248; Potassium 4.6; Sodium 141   Recent Lipid Panel No results found for: CHOL, TRIG, HDL, CHOLHDL, LDLCALC, LDLDIRECT  Physical Exam:    VS:  BP 140/80   Pulse (!) 58   Ht 5\' 10"  (1.778 m)   Wt 244 lb 6.4 oz (110.9 kg)   SpO2 96%   BMI 35.07 kg/m     Wt Readings from Last 3 Encounters:  01/12/20 244 lb 6.4 oz (110.9 kg)  11/20/19 246 lb 12.8 oz (111.9 kg)  11/11/19 242 lb 6.4 oz (110 kg)     Constitutional:      Appearance: Healthy appearance. Not in distress.  Neck:     Vascular: JVD normal.  Pulmonary:     Effort: Pulmonary effort is normal.     Breath sounds: No wheezing. No rales.  Cardiovascular:     Normal rate. Regular rhythm. Normal S1. Normal S2.     Murmurs: There is no murmur.  Edema:    Pretibial: trace edema of the right pretibial area.    Ankle: trace edema of the left ankle and 1+ edema of the right ankle. Abdominal:     Palpations: Abdomen is soft. There is no hepatomegaly.  Skin:    General: Skin is warm and dry.  Neurological:     General: No focal deficit present.     Mental Status: Alert and oriented to person, place and time.       ASSESSMENT & PLAN:    1. Other chest pain 2. Shortness of breath 3.  CAD in native artery Etiology of his symptoms are not entirely clear.  He does note shortness of breath with minimal activity.  His exam does not suggest volume excess today.  He does have a history of reduced LV function in  the setting of atrial fibrillation in the past.  This seems to be related to tachycardia.  His ejection fraction did improve in sinus rhythm.  He has had some left-sided chest discomfort.  This is fairly atypical for ischemia.  He does have a history of coronary artery disease.  In 2016 he had an occluded first diagonal and moderate disease in LAD and PDA.  He has not had an evaluation for ischemia since then.  He does note that his heart rate is unchanged when he exerts himself.  Question if he may have chronotropic incompetence.  He is on fairly good dose of beta-blocker therapy.  If testing is suggestive of this, we may need to reduce his beta-blocker and have him see Dr. Rayann Heman for further evaluation.    -Obtain BMET, BNP, CBC  -Obtain echocardiogram to rule out LV dysfunction  -Obtain exercise Myoview to rule out ischemia and chronotropic incompetence  -If ETT + for chrono. incompetence, consider reducing Coreg/refer back to Dr. Rayann Heman  -Follow-up in 4 weeks  4. Paroxysmal atrial fibrillation (HCC) History of PVI ablation x2.  He is maintaining normal sinus rhythm.  Continue current dose of Apixaban.  Obtain BMET, CBC.  5. Nonischemic cardiomyopathy ([place]) History of reduced LV function in the setting of atrial fibrillation with RVR.  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  His LV function did return to normal in sinus rhythm.  Since he does note new onset shortness of breath with exertion, I have suggested proceeding with an echocardiogram to reassess LV function.  Continue carvedilol, lisinopril.  6. Essential hypertension Blood pressure somewhat above target.  Proceed with work-up as above.  Continue to monitor for now.   Dispo:  Return in about 4 weeks  (around 02/09/2020) for Follow up after testing with Dr. Meda Coffee, or PA/NP.   Medication Adjustments/Labs and Tests Ordered: Current medicines are reviewed at length with the patient today.  Concerns regarding medicines are outlined above.  Tests Ordered: Orders Placed This Encounter  Procedures  . Basic metabolic panel  . Pro b natriuretic peptide (BNP)  . CBC  . MYOCARDIAL PERFUSION IMAGING  . EKG 12-Lead  . ECHOCARDIOGRAM COMPLETE   Medication Changes: No orders of the defined types were placed in this encounter.   Signed, Richardson Dopp, PA-C  01/12/2020 4:31 PM    [facility] [place], [place], [place]  [address] Phone: [phone]; Fax: [phone]

## 2020-01-13 ENCOUNTER — Telehealth: Payer: Self-pay

## 2020-01-13 DIAGNOSIS — R0602 Shortness of breath: Secondary | ICD-10-CM

## 2020-01-13 LAB — CBC
Hematocrit: 43.6 % (ref 37.5–51.0)
Hemoglobin: 14.7 g/dL (ref 13.0–17.7)
MCH: 32.3 pg (ref 26.6–33.0)
MCHC: 33.7 g/dL (ref 31.5–35.7)
MCV: 96 fL (ref 79–97)
Platelets: 220 10*3/uL (ref 150–450)
RBC: 4.55 x10E6/uL (ref 4.14–5.80)
RDW: 13.1 % (ref 11.6–15.4)
WBC: 8 10*3/uL (ref 3.4–10.8)

## 2020-01-13 LAB — BASIC METABOLIC PANEL
BUN/Creatinine Ratio: 20 (ref 10–24)
BUN: 28 mg/dL — ABNORMAL HIGH (ref 8–27)
CO2: 26 mmol/L (ref 20–29)
Calcium: 9.5 mg/dL (ref 8.6–10.2)
Chloride: 99 mmol/L (ref 96–106)
Creatinine, Ser: 1.37 mg/dL — ABNORMAL HIGH (ref 0.76–1.27)
GFR calc Af Amer: 57 mL/min/{1.73_m2} — ABNORMAL LOW (ref >59)
GFR calc non Af Amer: 49 mL/min/{1.73_m2} — ABNORMAL LOW (ref >59)
Glucose: 200 mg/dL — ABNORMAL HIGH (ref 65–99)
Potassium: 4.7 mmol/L (ref 3.5–5.2)
Sodium: 141 mmol/L (ref 134–144)

## 2020-01-13 LAB — PRO B NATRIURETIC PEPTIDE: NT-Pro BNP: 1046 pg/mL — ABNORMAL HIGH (ref 0–486)

## 2020-01-13 NOTE — Telephone Encounter (Signed)
I called and spoke with patient, he stated that he was only taking 20 mg of Furosemide once a day. I spoke with Nicki Reaper, per Nicki Reaper change orders to 40 mg by mouth once a day for 3 days and then go back to 20 mg once a day. Patient verbalized understanding and will come back on 01/21/20 for repeat BMET/BNP.

## 2020-01-13 NOTE — Telephone Encounter (Signed)
-----   Message from Liliane Shi, [place] sent at 01/13/2020  2:07 PM EDT ----- Glucose high.  K+ normal. Creatinine elevated.  Hgb normal.  BNP elevated.  PLAN:   - Increase Furosemide to 40 mg twice daily x 3 days, then resume 40 mg once daily   - BMET, BNP 1 week Richardson Dopp, PA-C    01/13/2020 1:57 PM

## 2020-01-21 ENCOUNTER — Other Ambulatory Visit: Payer: Self-pay

## 2020-01-21 ENCOUNTER — Other Ambulatory Visit: Payer: Medicare Other | Admitting: *Deleted

## 2020-01-21 DIAGNOSIS — R0602 Shortness of breath: Secondary | ICD-10-CM

## 2020-01-22 LAB — BASIC METABOLIC PANEL
BUN/Creatinine Ratio: 23 (ref 10–24)
BUN: 22 mg/dL (ref 8–27)
CO2: 29 mmol/L (ref 20–29)
Calcium: 9.5 mg/dL (ref 8.6–10.2)
Chloride: 95 mmol/L — ABNORMAL LOW (ref 96–106)
Creatinine, Ser: 0.95 mg/dL (ref 0.76–1.27)
GFR calc Af Amer: 89 mL/min/{1.73_m2} (ref >59)
GFR calc non Af Amer: 77 mL/min/{1.73_m2} (ref >59)
Glucose: 203 mg/dL — ABNORMAL HIGH (ref 65–99)
Potassium: 4.3 mmol/L (ref 3.5–5.2)
Sodium: 138 mmol/L (ref 134–144)

## 2020-01-22 LAB — PRO B NATRIURETIC PEPTIDE: NT-Pro BNP: 777 pg/mL — ABNORMAL HIGH (ref 0–486)

## 2020-01-27 ENCOUNTER — Telehealth (HOSPITAL_COMMUNITY): Payer: Self-pay

## 2020-01-27 NOTE — Telephone Encounter (Signed)
Detailed instructions left on the patient's answering machine. Asked to call back with any questions. S.Malana Eberwein EMTP 

## 2020-01-29 ENCOUNTER — Other Ambulatory Visit (HOSPITAL_COMMUNITY)
Admission: RE | Admit: 2020-01-29 | Discharge: 2020-01-29 | Disposition: A | Discharge status: Home / Self Care | Payer: Medicare Other | Source: Ambulatory Visit | Attending: Physician Assistant | Admitting: Physician Assistant

## 2020-01-29 DIAGNOSIS — Z01812 Encounter for preprocedural laboratory examination: Secondary | ICD-10-CM | POA: Diagnosis present

## 2020-01-29 DIAGNOSIS — Z20822 Contact with and (suspected) exposure to covid-19: Secondary | ICD-10-CM | POA: Insufficient documentation

## 2020-01-29 LAB — SARS CORONAVIRUS 2 (TAT 6-24 HRS): SARS Coronavirus 2: NEGATIVE

## 2020-02-02 ENCOUNTER — Ambulatory Visit (HOSPITAL_BASED_OUTPATIENT_CLINIC_OR_DEPARTMENT_OTHER): Payer: Medicare Other

## 2020-02-02 ENCOUNTER — Other Ambulatory Visit: Payer: Self-pay

## 2020-02-02 ENCOUNTER — Ambulatory Visit (HOSPITAL_COMMUNITY): Payer: Medicare Other | Attending: Cardiovascular Disease

## 2020-02-02 DIAGNOSIS — I2510 Atherosclerotic heart disease of native coronary artery without angina pectoris: Secondary | ICD-10-CM

## 2020-02-02 DIAGNOSIS — R0602 Shortness of breath: Secondary | ICD-10-CM | POA: Diagnosis not present

## 2020-02-02 DIAGNOSIS — R0789 Other chest pain: Secondary | ICD-10-CM | POA: Diagnosis not present

## 2020-02-02 LAB — MYOCARDIAL PERFUSION IMAGING
Estimated workload: 3 METS
Exercise duration (min): 1 min
Exercise duration (sec): 9 s
LV dias vol: 235 mL (ref 62–150)
LV sys vol: 161 mL
MPHR: 121 {beats}/min
Peak HR: 83 {beats}/min
Percent HR: 58 %
RPE: 20
Rest HR: 65 {beats}/min
SDS: 2
SRS: 3
SSS: 4
TID: 1.04

## 2020-02-02 LAB — ECHOCARDIOGRAM COMPLETE
Height: 70 in
Weight: 3904 oz

## 2020-02-02 MED ORDER — REGADENOSON 0.4 MG/5ML IV SOLN
0.4000 mg | Freq: Once | INTRAVENOUS | Status: AC
  Administered 2020-02-02: 0.4 mg via INTRAVENOUS

## 2020-02-02 MED ORDER — TECHNETIUM TC 99M TETROFOSMIN IV KIT
9.5000 | PACK | Freq: Once | INTRAVENOUS | Status: AC | PRN
  Administered 2020-02-02: 9.5 via INTRAVENOUS
  Filled 2020-02-02: qty 10

## 2020-02-02 MED ORDER — TECHNETIUM TC 99M TETROFOSMIN IV KIT
31.2000 | PACK | Freq: Once | INTRAVENOUS | Status: AC | PRN
  Administered 2020-02-02: 31.2 via INTRAVENOUS
  Filled 2020-02-02: qty 32

## 2020-02-03 ENCOUNTER — Encounter: Payer: Self-pay | Admitting: Physician Assistant

## 2020-02-04 ENCOUNTER — Telehealth: Payer: Self-pay

## 2020-02-04 ENCOUNTER — Encounter: Payer: Self-pay | Admitting: Physician Assistant

## 2020-02-04 MED ORDER — AMLODIPINE BESYLATE 5 MG PO TABS: 5.0000 mg | ORAL_TABLET | Freq: Every day | ORAL | 3 refills | Status: DC

## 2020-02-04 MED ORDER — CARVEDILOL 6.25 MG PO TABS: 6.2500 mg | ORAL_TABLET | Freq: Two times a day (BID) | ORAL | 3 refills | Status: DC

## 2020-02-04 NOTE — Telephone Encounter (Signed)
-----   Message from Liliane Shi, [place] sent at 02/04/2020 10:31 AM EDT ----- Please call the patient. The stress test has a lot of artifact.  EF is low but was normal on recent echocardiogram.  I reviewed with Dr. Meda Coffee.  She reviewed the study and feels the it is low risk (ie - no significant change to suggest a significant blockage).   His HR was blunted on the treadmill and his BP did go very high.  PLAN:   - Decrease Carvedilol to 6.25 mg twice daily   - Start Amlodipine 5 mg once daily   - Keep follow up as planned   - Send Copy to PCP Richardson Dopp, PA-C    02/04/2020 10:22 AM

## 2020-02-04 NOTE — Telephone Encounter (Signed)
The patient has been notified of the result and verbalized understanding.  All questions (if any) were answered. Patient will decrease Carvedilol to 6.25 mg, 1 tablet by mouth twice a day and start Amlodipine 5 mg, 1 tablet by mouth once a day. Patient will come back on 03/07/20 for follow up. Copies of tests sent to patients PCP. Mady Haagensen, [facility] 02/04/2020 3:27 PM

## 2020-02-11 ENCOUNTER — Encounter: Payer: Self-pay | Admitting: Physician Assistant

## 2020-02-11 LAB — HM DIABETES EYE EXAM

## 2020-02-20 ENCOUNTER — Other Ambulatory Visit: Payer: Self-pay | Admitting: Cardiology

## 2020-02-20 DIAGNOSIS — I10 Essential (primary) hypertension: Secondary | ICD-10-CM

## 2020-03-06 NOTE — Progress Notes (Signed)
Cardiology Office Note:    Date:  03/07/2020   ID:  Lucas Dudley, DOB [birth year], MRN [id number]  PCP:  [name], MD  Cardiologist:  Ena Dawley, MD   Electrophysiologist:  Thompson Grayer, MD   Referring MD: [name], MD   Chief Complaint:  Follow-up (CHF)    Patient Profile:    Lucas Dudley is a 79 y.o. male with:   Paroxysmal atrial fibrillation  ? S/p PVI ablation 08/2015, 06/2016 (Dr. Rayann Heman)  Coronary artery disease  ? D1 occluded, mod dz in LAD, RPDA tx med  HFrEF with return of normal LVF ? Nonischemic cardiomyopathy (tachycardia induced) ? Echo 4/16: EF 40 ? Echo 10/2017: EF 60-65  Diabetes mellitus   Hypertension   Hyperlipidemia   OSA on CPAP  Prior CV studies: Echocardiogram 02/02/2020 EF 55-60, no RWMA, mod conc LVH, Gr 1 DD, normal RVSF, RVSP 28.2, mild MR  Myoview 02/02/2020 EF 31 (normal on echocardiogram); HR blunted, artifact, no ischemia (reviewed with Dr. Meda Coffee - low risk)  Echocardiogram 10/21/2017 Severe LVH, EF 60-65, normal wall motion, grade 2 diastolic dysfunction  Cardiac catheterization 03/17/2015 LAD proximal 50; D1 ostial 100 RPDA 40 EF 25-35  Myoview 02/09/2015 Inferior infarct, mild peri-infarct ischemia; low risk  Echocardiogram 02/09/2015 Mild LVH, EF 40, diffuse HK, trivial MR, mild LAE, normal RV SF, PASP 15  History of Present Illness:    Lucas Dudley was last seen in clinic 01/12/2020.  He was having symptoms of chest pain and shortness of breath.  His BNP was elevated and I adjusted his Furosemide.  He had an echocardiogram that demonstrated normal EF and a Myoview demonstrated no significant ischemia.  His HR did not increase with exercise and I reduced his beta-blocker dose.  He returns for follow up.  He is here alone.  Since last seen, he is feeling better.  His breathing is improved.  He continues to have some occasional chest pain on the right.  This does not seem to be related to exertion.  It is fairly  chronic and unchanged.  He has syncope.  He does have lower extremity swelling that gets worse throughout the day and improves with elevation     Past Medical History:  Diagnosis Date  . Arthritis    "hands probably" (05/04/2015)  . CAD (coronary artery disease)    a. Cath 03/17/15 showing 100% ostial D1, 50% prox LAD to mid LAD, 40% RPDA stenosis. Med rx. // Myoview 01/2020: EF 31 diffuse perfusion defect without reversibility (suspect artifact); reviewed with Dr. Aggie Cosier study felt to be low risk  . Chronic diastolic CHF (congestive heart failure) (HCC)    Echocardiogram 01/2020: EF 55-60, no RWMA, moderate LVH, GR 1 DD, normal RV SF, RVSP 28.2, mild MR, mild aortic valve sclerosis (no aortic stenosis)  . Glaucoma   . History of gout   . Hyperlipidemia   . Hypertension   . Kidney stones    "found during prostate OR; still in there as far as I know" (05/04/2015)  . Melanoma of neck ([place])    "right; came back IV; no chemo"  . NICM (nonischemic cardiomyopathy) ([place])   . OSA on CPAP 2012  . Paroxysmal atrial fibrillation (HCC)    a. 04/2015 Tikosyn loading, DCCV x 1;  b. CHA2DS2VASc = 5-->Eliquis.  . Prostate cancer ([place])   . Type II diabetes mellitus (HCC)     Current Medications: Current Meds  Medication Sig  . amLODipine (NORVASC) 10 MG tablet Take  1 tablet (10 mg total) by mouth daily.  . Carboxymethylcellul-Glycerin (LUBRICATING EYE DROPS OP) Apply 1 drop to eye daily as needed (itching eyes).  . carvedilol (COREG) 6.25 MG tablet Take 1 tablet (6.25 mg total) by mouth 2 (two) times daily.  . dorzolamide-timolol (COSOPT) 22.3-6.8 MG/ML ophthalmic solution Place 1 drop into the left eye 2 (two) times daily.  [name] ELIQUIS 5 MG TABS tablet TAKE 1 TABLET TWICE A DAY  . furosemide (LASIX) 40 MG tablet Take 1 tablet (40 mg total) by mouth daily.  [name] gemfibrozil (LOPID) 600 MG tablet Take 600 mg by mouth at bedtime.   [name] glimepiride (AMARYL) 2 MG tablet Take 2 mg by mouth 2 (two) times  daily.   . Insulin Glargine (BASAGLAR KWIKPEN) 100 UNIT/ML SOPN Inject 40 Units into the skin at bedtime.   [name] latanoprost (XALATAN) 0.005 % ophthalmic solution Place 1 drop into both eyes at bedtime.  [name] lisinopril (ZESTRIL) 40 MG tablet TAKE 1 TABLET BY MOUTH EVERY DAY  . metFORMIN (GLUCOPHAGE-XR) 500 MG 24 hr tablet Take 1,000 mg by mouth 2 (two) times daily.  . valACYclovir (VALTREX) 1000 MG tablet Take 1,000 mg by mouth daily.  . [DISCONTINUED] amLODipine (NORVASC) 5 MG tablet Take 1 tablet (5 mg total) by mouth daily.     Allergies:   Tape   Social History   Tobacco Use  . Smoking status: Former Smoker    Packs/day: 2.00    Years: 27.00    Pack years: 54.00    Types: Cigarettes    Quit date: 10/31/1983    Years since quitting: 36.3  . Smokeless tobacco: Never Used  Substance Use Topics  . Alcohol use: No    Alcohol/week: 0.0 standard drinks    Comment: "used to drink; stopped ~ 2008"  . Drug use: No     Family Hx: The patient's family history includes Diabetes in his father; Healthy in his daughter; Heart attack (age of onset: 73) in his mother; Heart disease in his father and mother; Lung cancer in his mother; Stroke in his brother.  ROS   EKGs/Labs/Other Test Reviewed:    EKG:  EKG is   ordered today.  The ekg ordered today demonstrates sinus bradycardia, HR 53, left axis deviation, first-degree AV block, PR interval 242, QTC 409, no change from prior tracing  Recent Labs: 01/12/2020: Hemoglobin 14.7; Platelets 220 01/21/2020: BUN 22; Creatinine, Ser 0.95; NT-Pro BNP 777; Potassium 4.3; Sodium 138   Recent Lipid Panel No results found for: CHOL, TRIG, HDL, CHOLHDL, LDLCALC, LDLDIRECT  Physical Exam:    VS:  BP (!) 152/60   Pulse (!) 53   Ht 5\' 10"  (1.778 m)   Wt 245 lb 9.6 oz (111.4 kg)   SpO2 96%   BMI 35.24 kg/m     Wt Readings from Last 3 Encounters:  03/07/20 245 lb 9.6 oz (111.4 kg)  02/02/20 244 lb (110.7 kg)  01/12/20 244 lb 6.4 oz (110.9 kg)      Constitutional:      Appearance: Healthy appearance. Not in distress.  Neck:     Thyroid: No thyromegaly.     Vascular: JVD normal.  Pulmonary:     Effort: Pulmonary effort is normal.     Breath sounds: No wheezing. No rales.  Cardiovascular:     Normal rate. Regular rhythm. Normal S1. Normal S2.     Murmurs: There is no murmur.  Edema:    Pretibial: bilateral trace edema of the pretibial  area.    Ankle: bilateral 1+ edema of the ankle. Abdominal:     Palpations: Abdomen is soft. There is no hepatomegaly.  Skin:    General: Skin is warm and dry.  Neurological:     General: No focal deficit present.     Mental Status: Alert and oriented to person, place and time.     Cranial Nerves: Cranial nerves are intact.      ASSESSMENT & PLAN:    1. Chronic diastolic CHF (congestive heart failure) (HCC) Recent echocardiogram with normal LV function and mild diastolic dysfunction.  He did have a minimally elevated BNP and does note improved symptoms after increasing his Lasix for several days.  We discussed the importance of daily weights and limiting salt.  I have advised him to take extra Lasix if his weight increases by 3 pounds or more in 1 day.  He does have lower extremity swelling it seems to be multifactorial and related to heart failure as well as venous insufficiency.  I have suggested he wear compression stockings to manage this.  2. Coronary artery disease involving native coronary artery of native heart without angina pectoris He is not having anginal symptoms.  Recent Myoview demonstrated no ischemia.  He is not on aspirin as he is on Apixaban.  Continue current therapy.  3. Paroxysmal atrial fibrillation (HCC) Maintaining sinus rhythm.  He is tolerating anticoagulation with Apixaban.  Continue current therapy.  4. Essential hypertension Blood pressure is uncontrolled.  Increase amlodipine to 10 mg daily.   Dispo:  Return in about 6 months (around 09/07/2020) for Routine  Follow Up w/ Dr. Meda Coffee.   Medication Adjustments/Labs and Tests Ordered: Current medicines are reviewed at length with the patient today.  Concerns regarding medicines are outlined above.  Tests Ordered: Orders Placed This Encounter  Procedures  . EKG 12-Lead   Medication Changes: Meds ordered this encounter  Medications  . amLODipine (NORVASC) 10 MG tablet    Sig: Take 1 tablet (10 mg total) by mouth daily.    Dispense:  90 tablet    Refill:  1    Signed, Richardson Dopp, PA-C  03/07/2020 1:14 PM    [facility] [address], [place], [place]  [address] Phone: [phone]; Fax: [phone]

## 2020-03-07 ENCOUNTER — Other Ambulatory Visit: Payer: Self-pay

## 2020-03-07 ENCOUNTER — Encounter: Payer: Self-pay | Admitting: Physician Assistant

## 2020-03-07 ENCOUNTER — Ambulatory Visit: Payer: Medicare Other | Admitting: Physician Assistant

## 2020-03-07 VITALS — BP 152/60 | HR 53 | Ht 70.0 in | Wt 245.6 lb

## 2020-03-07 DIAGNOSIS — I5032 Chronic diastolic (congestive) heart failure: Secondary | ICD-10-CM | POA: Diagnosis not present

## 2020-03-07 DIAGNOSIS — I2510 Atherosclerotic heart disease of native coronary artery without angina pectoris: Secondary | ICD-10-CM | POA: Diagnosis not present

## 2020-03-07 DIAGNOSIS — I480 Paroxysmal atrial fibrillation: Secondary | ICD-10-CM

## 2020-03-07 DIAGNOSIS — I10 Essential (primary) hypertension: Secondary | ICD-10-CM | POA: Diagnosis not present

## 2020-03-07 MED ORDER — AMLODIPINE BESYLATE 10 MG PO TABS: 10.0000 mg | ORAL_TABLET | Freq: Every day | ORAL | 1 refills | Status: DC

## 2020-03-07 NOTE — Patient Instructions (Signed)
Medication Instructions:   Your physician has recommended you make the following change in your medication:   1) Increase Amlodipine to 10 mg, 1 tablet by mouth once a day  *If you need a refill on your cardiac medications before your next appointment, please call your pharmacy*  Lab Work:  None ordered today  Testing/Procedures:  None ordered today  Follow-Up: At [facility], you and your health needs are our priority.  As part of our continuing mission to provide you with exceptional heart care, we have created designated Provider Care Teams.  These Care Teams include your primary Cardiologist (physician) and Advanced Practice Providers (APPs -  Physician Assistants and Nurse Practitioners) who all work together to provide you with the care you need, when you need it.  On 09/21/20 at 9:40AM with Ena Dawley, MD

## 2020-03-10 ENCOUNTER — Other Ambulatory Visit: Payer: Self-pay

## 2020-03-10 ENCOUNTER — Ambulatory Visit: Payer: Medicare Other

## 2020-03-10 ENCOUNTER — Encounter: Payer: Self-pay | Admitting: Orthopaedic Surgery

## 2020-03-10 ENCOUNTER — Ambulatory Visit: Payer: Medicare Other | Admitting: Orthopaedic Surgery

## 2020-03-10 VITALS — BP 148/68 | HR 50 | Ht 70.5 in | Wt 242.0 lb

## 2020-03-10 DIAGNOSIS — M545 Low back pain, unspecified: Secondary | ICD-10-CM

## 2020-03-10 DIAGNOSIS — M79605 Pain in left leg: Secondary | ICD-10-CM | POA: Diagnosis not present

## 2020-03-10 NOTE — Progress Notes (Signed)
Subjective:    Patient ID: Lucas Dudley, male    DOB: 1941/03/19, 79 y.o.   MRN: [id number]  HPI He has had pain in the lower back for a while but it got worse over the last two to three weeks.  He has pain on the left side of the lower back that runs down to the left hip and the left posterior thigh and past the left knee to the lateral left foot.  He has no trauma.  He has no spasm.  He has tried heat and Tylenol and Advil with minimal help.  He says the pain is intense at times, more at night.   Review of Systems  Constitutional: Positive for activity change.  Cardiovascular: Positive for palpitations and leg swelling.  Musculoskeletal: Positive for arthralgias and back pain.  All other systems reviewed and are negative.  For Review of Systems, all other systems reviewed and are negative.  The following is a summary of the past history medically, past history surgically, known current medicines, social history and family history.  This information is gathered electronically by the computer from prior information and documentation.  I review this each visit and have found including this information at this point in the chart is beneficial and informative.   Past Medical History:  Diagnosis Date  . Arthritis    "hands probably" (05/04/2015)  . CAD (coronary artery disease)    a. Cath 03/17/15 showing 100% ostial D1, 50% prox LAD to mid LAD, 40% RPDA stenosis. Med rx. // Myoview 01/2020: EF 31 diffuse perfusion defect without reversibility (suspect artifact); reviewed with Dr. Aggie Cosier study felt to be low risk  . Chronic diastolic CHF (congestive heart failure) (HCC)    Echocardiogram 01/2020: EF 55-60, no RWMA, moderate LVH, GR 1 DD, normal RV SF, RVSP 28.2, mild MR, mild aortic valve sclerosis (no aortic stenosis)  . Glaucoma   . History of gout   . Hyperlipidemia   . Hypertension   . Kidney stones    "found during prostate OR; still in there as far as I know" (05/04/2015)  .  Melanoma of neck ([place])    "right; came back IV; no chemo"  . NICM (nonischemic cardiomyopathy) ([place])   . OSA on CPAP 2012  . Paroxysmal atrial fibrillation (HCC)    a. 04/2015 Tikosyn loading, DCCV x 1;  b. CHA2DS2VASc = 5-->Eliquis.  . Prostate cancer ([place])   . Type II diabetes mellitus ([place])     Past Surgical History:  Procedure Laterality Date  . ABDOMINAL HERNIA REPAIR     w/mesh  . CARDIAC CATHETERIZATION N/A 03/17/2015   Procedure: Left Heart Cath and Coronary Angiography;  Surgeon: Lorretta Harp, MD;  Location: [facility];  Service: Cardiovascular;  Laterality: N/A;  . carotid doppler  09/17/2008   rigt and left ICAs 0-49%;mildly  abnormal  . COLONOSCOPY N/A 05/16/2017   Procedure: COLONOSCOPY;  Surgeon: Rogene Houston, MD;  [facility]: [facility];  Service: Endoscopy;  Laterality: N/A;  930  . DOPPLER ECHOCARDIOGRAPHY  05/25/2009   EF 50-55%,LA mildly dilated, LV function normal  . ELECTROPHYSIOLOGIC STUDY N/A 04/05/2015   Procedure: Cardioversion;  Surgeon: Sanda Klein, MD;  Location: [facility];  Service: Cardiovascular;  Laterality: N/A;  . ELECTROPHYSIOLOGIC STUDY N/A 09/06/2015   Procedure: Atrial Fibrillation Ablation;  Surgeon: Thompson Grayer, MD;  Location: [facility];  Service: Cardiovascular;  Laterality: N/A;  . ELECTROPHYSIOLOGIC STUDY N/A 07/12/2016   redo  afib ablation by Dr Rayann Heman  . EXCISIONAL HEMORRHOIDECTOMY     "inside and out"  . FINE NEEDLE ASPIRATION Right    knee; "drew ~ 1 quart off"  . HERNIA REPAIR    . LAPAROSCOPIC CHOLECYSTECTOMY    . MELANOMA EXCISION Right    "neck"  . NM MYOCAR PERF WALL MOTION  02/21/2012   EF 61% ,EXERCISE 7 METS. exercise stopped due to wheezing and shortness of breathe  . POLYPECTOMY  05/16/2017   Procedure: POLYPECTOMY;  Surgeon: Rogene Houston, MD;  [facility];  Service: Endoscopy;;  colon  . PROSTATECTOMY    . SHOULDER OPEN ROTATOR CUFF REPAIR Right X 2  . TEE WITHOUT  CARDIOVERSION N/A 09/05/2015   Procedure: TRANSESOPHAGEAL ECHOCARDIOGRAM (TEE);  Surgeon: Sueanne Margarita, MD;  Location: [facility] ENDOSCOPY;  Service: Cardiovascular;  Laterality: N/A;    Current Outpatient Medications on File Prior to Visit  Medication Sig Dispense Refill  . amLODipine (NORVASC) 10 MG tablet Take 1 tablet (10 mg total) by mouth daily. 90 tablet 1  . Carboxymethylcellul-Glycerin (LUBRICATING EYE DROPS OP) Apply 1 drop to eye daily as needed (itching eyes).    . carvedilol (COREG) 6.25 MG tablet Take 1 tablet (6.25 mg total) by mouth 2 (two) times daily. 180 tablet 3  . dorzolamide-timolol (COSOPT) 22.3-6.8 MG/ML ophthalmic solution Place 1 drop into the left eye 2 (two) times daily.    [name] ELIQUIS 5 MG TABS tablet TAKE 1 TABLET TWICE A DAY 180 tablet 1  . furosemide (LASIX) 40 MG tablet Take 1 tablet (40 mg total) by mouth daily. 90 tablet 3  . gemfibrozil (LOPID) 600 MG tablet Take 600 mg by mouth at bedtime.     [name] glimepiride (AMARYL) 2 MG tablet Take 2 mg by mouth 2 (two) times daily.     . Insulin Glargine (BASAGLAR KWIKPEN) 100 UNIT/ML SOPN Inject 40 Units into the skin at bedtime.     [name] latanoprost (XALATAN) 0.005 % ophthalmic solution Place 1 drop into both eyes at bedtime.    [name] lisinopril (ZESTRIL) 40 MG tablet TAKE 1 TABLET BY MOUTH EVERY DAY 90 tablet 2  . metFORMIN (GLUCOPHAGE-XR) 500 MG 24 hr tablet Take 1,000 mg by mouth 2 (two) times daily.    . valACYclovir (VALTREX) 1000 MG tablet Take 1,000 mg by mouth daily.     No current facility-administered medications on file prior to visit.    Social History   Socioeconomic History  . Marital status: Married    Spouse name: Not on file  . Number of children: Not on file  . Years of education: Not on file  . Highest education level: Not on file  Occupational History  . Occupation: Retired  Tobacco Use  . Smoking status: Former Smoker    Packs/day: 2.00    Years: 27.00    Pack years: 54.00    Types: Cigarettes     Quit date: 10/31/1983    Years since quitting: 36.3  . Smokeless tobacco: Never Used  Substance and Sexual Activity  . Alcohol use: No    Alcohol/week: 0.0 standard drinks    Comment: "used to drink; stopped ~ 2008"  . Drug use: No  . Sexual activity: Not Currently  Other Topics Concern  . Not on file  Social History Narrative   Lives in [place], [place] with wife.   Social Determinants of Health   Financial Resource Strain:   . Difficulty of Paying Living Expenses:   Food  Insecurity:   . Worried About Charity fundraiser in the Last Year:   . Arboriculturist in the Last Year:   Transportation Needs:   . Film/video editor (Medical):   [name] Lack of Transportation (Non-Medical):   Physical Activity:   . Days of Exercise per Week:   . Minutes of Exercise per Session:   Stress:   . Feeling of Stress :   Social Connections:   . Frequency of Communication with Friends and Family:   . Frequency of Social Gatherings with Friends and Family:   . Attends Religious Services:   . Active Member of Clubs or Organizations:   . Attends Archivist Meetings:   [name] Marital Status:   Intimate Partner Violence:   . Fear of Current or Ex-Partner:   . Emotionally Abused:   [name] Physically Abused:   . Sexually Abused:     Family History  Problem Relation Age of Onset  . Heart disease Mother   . Lung cancer Mother   . Heart attack Mother 69  . Stroke Brother   . Healthy Daughter   . Diabetes Father   . Heart disease Father     BP (!) 148/68   Pulse (!) 50   Ht 5' 10.5" (1.791 m)   Wt 242 lb (109.8 kg)   BMI 34.23 kg/m   Body mass index is 34.23 kg/m.      Objective:   Physical Exam Vitals and nursing note reviewed.  Constitutional:      Appearance: He is well-developed.  HENT:     Head: Normocephalic and atraumatic.  Eyes:     Conjunctiva/sclera: Conjunctivae normal.     Pupils: Pupils are equal, round, and reactive to light.  Cardiovascular:     Rate and Rhythm:  Normal rate and regular rhythm.  Pulmonary:     Effort: Pulmonary effort is normal.  Abdominal:     Palpations: Abdomen is soft.  Musculoskeletal:       Arms:     Cervical back: Normal range of motion and neck supple.  Skin:    General: Skin is warm and dry.  Neurological:     Mental Status: He is alert and oriented to person, place, and time.     Cranial Nerves: No cranial nerve deficit.     Motor: No abnormal muscle tone.     Coordination: Coordination normal.     Deep Tendon Reflexes: Reflexes are normal and symmetric. Reflexes normal.  Psychiatric:        Behavior: Behavior normal.        Thought Content: Thought content normal.        Judgment: Judgment normal.    X-rays were done of the lumbar spine, reported separately.        Assessment & Plan:   Encounter Diagnosis  Name Primary?  . Lumbar pain with radiation down left leg Yes   I would like to get MRI of the lumbar spine.  There is a questionable fracture of L3, diffuse degenerative changes.  He has positive SLR as well.  Return in two weeks.  Call if any problem.  Precautions discussed.   Electronically Signed Sanjuana Kava, MD 5/27/20213:54 PM

## 2020-03-31 ENCOUNTER — Ambulatory Visit: Payer: Medicare Other | Admitting: Orthopaedic Surgery

## 2020-04-08 ENCOUNTER — Ambulatory Visit
Admission: RE | Admit: 2020-04-08 | Discharge: 2020-04-08 | Disposition: A | Discharge status: Home / Self Care | Payer: Medicare Other | Source: Ambulatory Visit | Attending: Orthopaedic Surgery | Admitting: Orthopaedic Surgery

## 2020-04-08 DIAGNOSIS — M79605 Pain in left leg: Secondary | ICD-10-CM

## 2020-04-08 DIAGNOSIS — M545 Low back pain, unspecified: Secondary | ICD-10-CM

## 2020-04-13 ENCOUNTER — Other Ambulatory Visit: Payer: Medicare Other

## 2020-04-14 ENCOUNTER — Other Ambulatory Visit: Payer: Self-pay

## 2020-04-14 ENCOUNTER — Encounter: Payer: Self-pay | Admitting: Orthopaedic Surgery

## 2020-04-14 ENCOUNTER — Ambulatory Visit (INDEPENDENT_AMBULATORY_CARE_PROVIDER_SITE_OTHER): Payer: Medicare Other | Admitting: Orthopaedic Surgery

## 2020-04-14 VITALS — BP 147/68 | HR 57 | Ht 70.5 in | Wt 242.0 lb

## 2020-04-14 DIAGNOSIS — M79605 Pain in left leg: Secondary | ICD-10-CM

## 2020-04-14 DIAGNOSIS — M545 Low back pain, unspecified: Secondary | ICD-10-CM

## 2020-04-14 NOTE — Progress Notes (Signed)
Patient Lucas Dudley, male DOB:Dec 04, 1940, 79 y.o. CBJ:[id number]  Chief Complaint  Patient presents with   Back Pain    feels better     HPI  Lucas Dudley is a 79 y.o. male who has lower back pain.  He had MRI done which showed: IMPRESSION: 1. 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 2. Moderate narrowing of the lateral recesses and foramina bilaterally at L3-4 and L4-5. Foraminal narrowing is worst on the left at L3-4. 3. Moderate facet hypertrophy at L5-S1 without nerve root encroachment or significant spinal stenosis.  I have explained the findings to him.  I have independently reviewed the MRI.  He was told about possible epidural.  He wants to wait.  He said he got a new mattress and he is sleeping better and has less pain.    Body mass index is 34.23 kg/m.  ROS  Review of Systems  Constitutional: Positive for activity change.  Cardiovascular: Positive for palpitations and leg swelling.  Musculoskeletal: Positive for arthralgias and back pain.  All other systems reviewed and are negative.   All other systems reviewed and are negative.  The following is a summary of the past history medically, past history surgically, known current medicines, social history and family history.  This information is gathered electronically by the computer from prior information and documentation.  I review this each visit and have found including this information at this point in the chart is beneficial and informative.    Past Medical History:  Diagnosis Date   Arthritis    "hands probably" (05/04/2015)   CAD (coronary artery disease)    a. Cath 03/17/15 showing 100% ostial D1, 50% prox LAD to mid LAD, 40% RPDA stenosis. Med rx. // Myoview 01/2020: EF 31 diffuse perfusion defect without reversibility (suspect artifact); reviewed with  Dr. Aggie Cosier study felt to be low risk   Chronic diastolic CHF (congestive heart failure) ([place])    Echocardiogram 01/2020: EF 55-60, no RWMA, moderate LVH, GR 1 DD, normal RV SF, RVSP 28.2, mild MR, mild aortic valve sclerosis (no aortic stenosis)   Glaucoma    History of gout    Hyperlipidemia    Hypertension    Kidney stones    "found during prostate OR; still in there as far as I know" (05/04/2015)   Melanoma of neck ([place])    "right; came back IV; no chemo"   NICM (nonischemic cardiomyopathy) ([place])    OSA on CPAP 2012   Paroxysmal atrial fibrillation ([place])    a. 04/2015 Tikosyn loading, DCCV x 1;  b. CHA2DS2VASc = 5-->Eliquis.   Prostate cancer ([place])    Type II diabetes mellitus ([place])     Past Surgical History:  Procedure Laterality Date   ABDOMINAL HERNIA REPAIR     w/mesh   CARDIAC CATHETERIZATION N/A 03/17/2015   Procedure: Left Heart Cath and Coronary Angiography;  Surgeon: Lorretta Harp, MD;  Location: [facility];  Service: Cardiovascular;  Laterality: N/A;   carotid doppler  09/17/2008   rigt and left ICAs 0-49%;mildly  abnormal   COLONOSCOPY N/A 05/16/2017   Procedure: COLONOSCOPY;  Surgeon: Rogene Houston, MD;  [facility];  Service: Endoscopy;  Laterality: N/A;  [place]  05/25/2009   EF 50-55%,LA mildly dilated, LV function normal   ELECTROPHYSIOLOGIC STUDY N/A 04/05/2015   Procedure: Cardioversion;  Surgeon: Dani Gobble  Croitoru, MD;  Location: [facility];  Service: Cardiovascular;  Laterality: N/A;   ELECTROPHYSIOLOGIC STUDY N/A 09/06/2015   Procedure: Atrial Fibrillation Ablation;  Surgeon: Thompson Grayer, MD;  Location: [facility];  Service: Cardiovascular;  Laterality: N/A;   ELECTROPHYSIOLOGIC STUDY N/A 07/12/2016   redo afib ablation by Dr Rayann Heman   EXCISIONAL HEMORRHOIDECTOMY     "inside and out"   FINE NEEDLE ASPIRATION Right    knee; "drew ~ 1 quart off"   [place] Right    "neck"   NM MYOCAR PERF WALL MOTION  02/21/2012   EF 61% ,EXERCISE 7 METS. exercise stopped due to wheezing and shortness of breathe   POLYPECTOMY  05/16/2017   Procedure: POLYPECTOMY;  Surgeon: Rogene Houston, MD;  [facility];  Service: Endoscopy;;  colon   PROSTATECTOMY     SHOULDER OPEN ROTATOR CUFF REPAIR Right X 2   TEE WITHOUT CARDIOVERSION N/A 09/05/2015   Procedure: TRANSESOPHAGEAL ECHOCARDIOGRAM (TEE);  Surgeon: Sueanne Margarita, MD;  Location: [facility];  Service: Cardiovascular;  Laterality: N/A;    Family History  Problem Relation Age of Onset   Heart disease Mother    Lung cancer Mother    Heart attack Mother 67   Stroke Brother    Healthy Daughter    Diabetes Father    Heart disease Father     Social History Social History   Tobacco Use   Smoking status: Former Smoker    Packs/day: 2.00    Years: 27.00    Pack years: 54.00    Types: Cigarettes    Quit date: 10/31/1983    Years since quitting: 36.4   Smokeless tobacco: Never Used  Substance Use Topics   Alcohol use: No    Alcohol/week: 0.0 standard drinks    Comment: "used to drink; stopped ~ 2008"   Drug use: No    Allergies  Allergen Reactions   Tape Rash and Other (See Comments)    Causes skin redness, Use paper tape only.    Current Outpatient Medications  Medication Sig Dispense Refill   amLODipine (NORVASC) 10 MG tablet Take 1 tablet (10 mg total) by mouth daily. 90 tablet 1   Carboxymethylcellul-Glycerin (LUBRICATING EYE DROPS OP) Apply 1 drop to eye daily as needed (itching eyes).     carvedilol (COREG) 6.25 MG tablet Take 1 tablet (6.25 mg total) by mouth 2 (two) times daily. 180 tablet 3   dorzolamide-timolol (COSOPT) 22.3-6.8 MG/ML ophthalmic solution Place 1 drop into the left eye 2 (two) times daily.     ELIQUIS 5 MG TABS tablet TAKE 1 TABLET TWICE A DAY 180 tablet 1   furosemide (LASIX) 40  MG tablet Take 1 tablet (40 mg total) by mouth daily. 90 tablet 3   gemfibrozil (LOPID) 600 MG tablet Take 600 mg by mouth at bedtime.      glimepiride (AMARYL) 2 MG tablet Take 2 mg by mouth 2 (two) times daily.      Insulin Glargine (BASAGLAR KWIKPEN) 100 UNIT/ML SOPN Inject 40 Units into the skin at bedtime.      latanoprost (XALATAN) 0.005 % ophthalmic solution Place 1 drop into both eyes at bedtime.     lisinopril (ZESTRIL) 40 MG tablet TAKE 1 TABLET BY MOUTH EVERY DAY 90 tablet 2   metFORMIN (GLUCOPHAGE-XR) 500 MG 24 hr tablet Take 1,000 mg by mouth 2 (two) times daily.  valACYclovir (VALTREX) 1000 MG tablet Take 1,000 mg by mouth daily.     No current facility-administered medications for this visit.     Physical Exam  Blood pressure (!) 147/68, pulse (!) 57, height 5' 10.5" (1.791 m), weight 242 lb (109.8 kg).  Constitutional: overall normal hygiene, normal nutrition, well developed, normal grooming, normal body habitus. Assistive device:none  Musculoskeletal: gait and station Limp none, muscle tone and strength are normal, no tremors or atrophy is present.  .  Neurological: coordination overall normal.  Deep tendon reflex/nerve stretch intact.  Sensation normal.  Cranial nerves II-XII intact.   Skin:   Normal overall no scars, lesions, ulcers or rashes. No psoriasis.  Psychiatric: Alert and oriented x 3.  Recent memory intact, remote memory unclear.  Normal mood and affect. Well groomed.  Good eye contact.  Cardiovascular: overall no swelling, no varicosities, no edema bilaterally, normal temperatures of the legs and arms, no clubbing, cyanosis and good capillary refill.  Lymphatic: palpation is normal.  Spine/Pelvis examination:  Inspection:  Overall, sacoiliac joint benign and hips nontender; without crepitus or defects.   Thoracic spine inspection: Alignment normal without kyphosis present   Lumbar spine inspection:  Alignment  with normal lumbar lordosis,  without scoliosis apparent.   Thoracic spine palpation:  without tenderness of spinal processes   Lumbar spine palpation: without tenderness of lumbar area; without tightness of lumbar muscles    Range of Motion:   Lumbar flexion, forward flexion is normal without pain or tenderness    Lumbar extension is full without pain or tenderness   Left lateral bend is normal without pain or tenderness   Right lateral bend is normal without pain or tenderness   Straight leg raising is normal  Strength & tone: normal   Stability overall normal stability  All other systems reviewed and are negative   The patient has been educated about the nature of the problem(s) and counseled on treatment options.  The patient appeared to understand what I have discussed and is in agreement with it.  Encounter Diagnosis  Name Primary?   Lumbar pain with radiation down left leg Yes    PLAN Call if any problems.  Precautions discussed.  Continue current medications.   Return to clinic 1 month   Electronically Signed Sanjuana Kava, MD 7/1/202110:50 AM

## 2020-04-28 ENCOUNTER — Ambulatory Visit (HOSPITAL_COMMUNITY): Payer: Medicare Other | Admitting: Nurse Practitioner

## 2020-05-02 ENCOUNTER — Encounter (HOSPITAL_COMMUNITY): Payer: Self-pay | Admitting: Nurse Practitioner

## 2020-05-02 ENCOUNTER — Other Ambulatory Visit: Payer: Self-pay

## 2020-05-02 ENCOUNTER — Ambulatory Visit (HOSPITAL_COMMUNITY)
Admission: RE | Admit: 2020-05-02 | Discharge: 2020-05-02 | Disposition: A | Discharge status: Home / Self Care | Payer: Medicare Other | Source: Ambulatory Visit | Attending: Nurse Practitioner | Admitting: Nurse Practitioner

## 2020-05-02 VITALS — BP 130/60 | HR 57 | Ht 70.0 in | Wt 244.0 lb

## 2020-05-02 DIAGNOSIS — I5032 Chronic diastolic (congestive) heart failure: Secondary | ICD-10-CM | POA: Diagnosis not present

## 2020-05-02 DIAGNOSIS — Z7901 Long term (current) use of anticoagulants: Secondary | ICD-10-CM | POA: Insufficient documentation

## 2020-05-02 DIAGNOSIS — I4819 Other persistent atrial fibrillation: Secondary | ICD-10-CM | POA: Diagnosis not present

## 2020-05-02 DIAGNOSIS — I480 Paroxysmal atrial fibrillation: Secondary | ICD-10-CM

## 2020-05-02 DIAGNOSIS — Z794 Long term (current) use of insulin: Secondary | ICD-10-CM | POA: Diagnosis not present

## 2020-05-02 DIAGNOSIS — I2510 Atherosclerotic heart disease of native coronary artery without angina pectoris: Secondary | ICD-10-CM | POA: Insufficient documentation

## 2020-05-02 DIAGNOSIS — Z87891 Personal history of nicotine dependence: Secondary | ICD-10-CM | POA: Insufficient documentation

## 2020-05-02 DIAGNOSIS — E785 Hyperlipidemia, unspecified: Secondary | ICD-10-CM | POA: Insufficient documentation

## 2020-05-02 DIAGNOSIS — M1990 Unspecified osteoarthritis, unspecified site: Secondary | ICD-10-CM | POA: Diagnosis not present

## 2020-05-02 DIAGNOSIS — I428 Other cardiomyopathies: Secondary | ICD-10-CM | POA: Diagnosis not present

## 2020-05-02 DIAGNOSIS — D6869 Other thrombophilia: Secondary | ICD-10-CM

## 2020-05-02 DIAGNOSIS — E119 Type 2 diabetes mellitus without complications: Secondary | ICD-10-CM | POA: Insufficient documentation

## 2020-05-02 DIAGNOSIS — Z79899 Other long term (current) drug therapy: Secondary | ICD-10-CM | POA: Insufficient documentation

## 2020-05-02 DIAGNOSIS — Z9989 Dependence on other enabling machines and devices: Secondary | ICD-10-CM | POA: Diagnosis not present

## 2020-05-02 DIAGNOSIS — G4733 Obstructive sleep apnea (adult) (pediatric): Secondary | ICD-10-CM | POA: Insufficient documentation

## 2020-05-02 DIAGNOSIS — H409 Unspecified glaucoma: Secondary | ICD-10-CM | POA: Diagnosis not present

## 2020-05-02 DIAGNOSIS — Z8546 Personal history of malignant neoplasm of prostate: Secondary | ICD-10-CM | POA: Insufficient documentation

## 2020-05-02 DIAGNOSIS — Z87442 Personal history of urinary calculi: Secondary | ICD-10-CM | POA: Insufficient documentation

## 2020-05-02 DIAGNOSIS — Z833 Family history of diabetes mellitus: Secondary | ICD-10-CM | POA: Insufficient documentation

## 2020-05-02 DIAGNOSIS — Z9079 Acquired absence of other genital organ(s): Secondary | ICD-10-CM | POA: Insufficient documentation

## 2020-05-02 DIAGNOSIS — Z8249 Family history of ischemic heart disease and other diseases of the circulatory system: Secondary | ICD-10-CM | POA: Diagnosis not present

## 2020-05-02 DIAGNOSIS — I110 Hypertensive heart disease with heart failure: Secondary | ICD-10-CM | POA: Insufficient documentation

## 2020-05-02 NOTE — Progress Notes (Signed)
Primary Care Physician: [name], MD Primary Cardiologist: Gwenlyn Found Primary Electrophysiologist: Genevie Ann is a 79 y.o. male with a history of persistent atrial fibrillation who presents for follow up in the [facility].  Since last being seen in clinic, the patient reports doing very well. afib burden low. No sustained AF. No bleeding issues on [place]. He did lose his wife unexpectedly a couple of months ago and is still grieving over this. He has chronic LLE, which he feels is at his baseline . He states that the legs go  down by the morning and fluid accumulates during the day.   Today, he  denies symptoms of palpitations, chest pain, shortness of breath, orthopnea, PND, lower extremity edema, dizziness, presyncope, syncope, snoring, daytime somnolence, bleeding, or neurologic sequela. The patient is tolerating medications without difficulties and is otherwise without complaint today.    Atrial Fibrillation Risk Factors:  he does have symptoms or diagnosis of sleep apnea. he is compliant with CPAP therapy.  he does not have a history of rheumatic fever.  he does not have a history of alcohol use.  he has a BMI of Body mass index is 35.01 kg/m.[name] Filed Weights   05/02/20 1442  Weight: 110.7 kg    LA size: 40   Atrial Fibrillation Management history:  Previous antiarrhythmic drugs: Tikosyn  Previous cardioversions: 2016  Previous ablations: 2016  CHADS2VASC score: 5  Anticoagulation history: Eliquis   Past Medical History:  Diagnosis Date  . Arthritis    "hands probably" (05/04/2015)  . CAD (coronary artery disease)    a. Cath 03/17/15 showing 100% ostial D1, 50% prox LAD to mid LAD, 40% RPDA stenosis. Med rx. // Myoview 01/2020: EF 31 diffuse perfusion defect without reversibility (suspect artifact); reviewed with Dr. Aggie Cosier study felt to be low risk  . Chronic diastolic CHF (congestive heart failure) ([facility])     Echocardiogram 01/2020: EF 55-60, no RWMA, moderate LVH, GR 1 DD, normal RV SF, RVSP 28.2, mild MR, mild aortic valve sclerosis (no aortic stenosis)  . Glaucoma   . History of gout   . Hyperlipidemia   . Hypertension   . Kidney stones    "found during prostate OR; still in there as far as I know" (05/04/2015)  . Melanoma of neck ([place])    "right; came back IV; no chemo"  . NICM (nonischemic cardiomyopathy) ([place])   . OSA on CPAP 2012  . Paroxysmal atrial fibrillation ([facility])    a. 04/2015 Tikosyn loading, DCCV x 1;  b. CHA2DS2VASc = 5-->Eliquis.  . Prostate cancer ([place])   . Type II diabetes mellitus ([place])    Past Surgical History:  Procedure Laterality Date  . ABDOMINAL HERNIA REPAIR     w/mesh  . CARDIAC CATHETERIZATION N/A 03/17/2015   Procedure: Left Heart Cath and Coronary Angiography;  Surgeon: Lorretta Harp, MD;  Location: [facility];  Service: Cardiovascular;  Laterality: N/A;  . carotid doppler  09/17/2008   rigt and left ICAs 0-49%;mildly  abnormal  . COLONOSCOPY N/A 05/16/2017   Procedure: COLONOSCOPY;  Surgeon: Rogene Houston, MD;  [facility];  Service: Endoscopy;  Laterality: N/A;  930  . DOPPLER ECHOCARDIOGRAPHY  05/25/2009   EF 50-55%,LA mildly dilated, LV function normal  . ELECTROPHYSIOLOGIC STUDY N/A 04/05/2015   Procedure: Cardioversion;  Surgeon: Sanda Klein, MD;  Location: [facility];  Service: Cardiovascular;  Laterality: N/A;  . ELECTROPHYSIOLOGIC STUDY N/A 09/06/2015  Procedure: Atrial Fibrillation Ablation;  Surgeon: Thompson Grayer, MD;  Location: [facility];  Service: Cardiovascular;  Laterality: N/A;  . ELECTROPHYSIOLOGIC STUDY N/A 07/12/2016   redo afib ablation by Dr Rayann Heman  . EXCISIONAL HEMORRHOIDECTOMY     "inside and out"  . FINE NEEDLE ASPIRATION Right    knee; "drew ~ 1 quart off"  . HERNIA REPAIR    . LAPAROSCOPIC CHOLECYSTECTOMY    . MELANOMA EXCISION Right    "neck"  . NM MYOCAR PERF WALL MOTION  02/21/2012    EF 61% ,EXERCISE 7 METS. exercise stopped due to wheezing and shortness of breathe  . POLYPECTOMY  05/16/2017   Procedure: POLYPECTOMY;  Surgeon: Rogene Houston, MD;  [facility];  Service: Endoscopy;;  colon  . PROSTATECTOMY    . SHOULDER OPEN ROTATOR CUFF REPAIR Right X 2  . TEE WITHOUT CARDIOVERSION N/A 09/05/2015   Procedure: TRANSESOPHAGEAL ECHOCARDIOGRAM (TEE);  Surgeon: Sueanne Margarita, MD;  Location: [facility] ENDOSCOPY;  Service: Cardiovascular;  Laterality: N/A;    Current Outpatient Medications  Medication Sig Dispense Refill  . amLODipine (NORVASC) 10 MG tablet Take 1 tablet (10 mg total) by mouth daily. 90 tablet 1  . Carboxymethylcellul-Glycerin (LUBRICATING EYE DROPS OP) Apply 1 drop to eye daily as needed (itching eyes).    . carvedilol (COREG) 6.25 MG tablet Take 1 tablet (6.25 mg total) by mouth 2 (two) times daily. 180 tablet 3  . dorzolamide-timolol (COSOPT) 22.3-6.8 MG/ML ophthalmic solution Place 1 drop into the left eye 2 (two) times daily.    [name] ELIQUIS 5 MG TABS tablet TAKE 1 TABLET TWICE A DAY 180 tablet 1  . furosemide (LASIX) 40 MG tablet Take 1 tablet (40 mg total) by mouth daily. 90 tablet 3  . gemfibrozil (LOPID) 600 MG tablet Take 600 mg by mouth at bedtime.     [name] glimepiride (AMARYL) 2 MG tablet Take 2 mg by mouth 2 (two) times daily.     . Insulin Glargine (BASAGLAR KWIKPEN) 100 UNIT/ML SOPN Inject 40 Units into the skin at bedtime.     [name] latanoprost (XALATAN) 0.005 % ophthalmic solution Place 1 drop into both eyes at bedtime.    [name] lisinopril (ZESTRIL) 40 MG tablet TAKE 1 TABLET BY MOUTH EVERY DAY 90 tablet 2  . metFORMIN (GLUCOPHAGE-XR) 500 MG 24 hr tablet Take 1,000 mg by mouth 2 (two) times daily.    . valACYclovir (VALTREX) 1000 MG tablet Take 1,000 mg by mouth daily.      No current facility-administered medications for this encounter.    Allergies  Allergen Reactions  . Tape Rash and Other (See Comments)    Causes skin redness, Use paper  tape only.    Social History   Socioeconomic History  . Marital status: Married    Spouse name: Not on file  . Number of children: Not on file  . Years of education: Not on file  . Highest education level: Not on file  Occupational History  . Occupation: Retired  Tobacco Use  . Smoking status: Former Smoker    Packs/day: 2.00    Years: 27.00    Pack years: 54.00    Types: Cigarettes    Quit date: 10/31/1983    Years since quitting: 36.5  . Smokeless tobacco: Never Used  Substance and Sexual Activity  . Alcohol use: No    Alcohol/week: 0.0 standard drinks    Comment: "used to drink; stopped ~ 2008"  . Drug use: No  .  Sexual activity: Not Currently  Other Topics Concern  . Not on file  Social History Narrative   Lives in [place], [place] with wife.   Social Determinants of Health   Financial Resource Strain:   . Difficulty of Paying Living Expenses:   Food Insecurity:   . Worried About Charity fundraiser in the Last Year:   . Arboriculturist in the Last Year:   Transportation Needs:   . Film/video editor (Medical):   [name] Lack of Transportation (Non-Medical):   Physical Activity:   . Days of Exercise per Week:   . Minutes of Exercise per Session:   Stress:   . Feeling of Stress :   Social Connections:   . Frequency of Communication with Friends and Family:   . Frequency of Social Gatherings with Friends and Family:   . Attends Religious Services:   . Active Member of Clubs or Organizations:   . Attends Archivist Meetings:   [name] Marital Status:   Intimate Partner Violence:   . Fear of Current or Ex-Partner:   . Emotionally Abused:   [name] Physically Abused:   . Sexually Abused:     Family History  Problem Relation Age of Onset  . Heart disease Mother   . Lung cancer Mother   . Heart attack Mother 62  . Stroke Brother   . Healthy Daughter   . Diabetes Father   . Heart disease Father     ROS- All systems are reviewed and negative except as per the  HPI above.  Physical Exam: Vitals:   05/02/20 1442  BP: 130/60  Pulse: (!) 57  SpO2: 99%  Weight: 110.7 kg  Height: 5\' 10"  (1.778 m)    GEN- The patient is well appearing, alert and oriented x 3 today.   Head- normocephalic, atraumatic Eyes-  Sclera clear, conjunctiva pink Ears- hearing intact Oropharynx- clear Neck- supple  Lungs- Clear to ausculation bilaterally, normal work of breathing Heart- irregular rate and rhythm  GI- soft, NT, ND, + BS Extremities- no clubbing, cyanosis, or  2+ edema  MS- no significant deformity or atrophy Skin- no rash or lesion Psych- euthymic mood, full affect Neuro- strength and sensation are intact  Wt Readings from Last 3 Encounters:  05/02/20 110.7 kg  04/14/20 109.8 kg  03/10/20 109.8 kg    EKG today demonstrates  Sinus brady at 57 bpm, with first degree block. PR int 243  ms, qrs int 110 ms, qtc 420 ms  Epic records are reviewed at length today  Assessment and Plan:  1. Persistent atrial fibrillation Doing well off AAD therapy No sustained AF Continue Eliquis 5 mg bid  for CHADS2VASC of 5  2. HTN Stable   3. Obstructive sleep apnea The patient reports compliance with CPAP   4. Combined systolic /diastolic HF  Continue with current diuretic Pt states current leg swelling is his normal   Follow up here in 9  months    Roderic Palau, NP 05/02/2020 4:37 PM

## 2020-05-03 ENCOUNTER — Other Ambulatory Visit: Payer: Self-pay | Admitting: Internal Medicine

## 2020-05-03 NOTE — Telephone Encounter (Signed)
Pt last saw Roderic Palau, NP on 05/02/20, last labs 01/21/20 Creat 0.95, age 79, weight 110.7kg, based on specified criteria pt is on appropriate dosage of Eliquis 5mg  BID.  Will refill rx.

## 2020-05-06 NOTE — Progress Notes (Addendum)
[facility] Note  05/09/2020     CHIEF COMPLAINT Patient presents for Retina Evaluation   HISTORY OF PRESENT ILLNESS: Lucas Dudley is a 79 y.o. male who presents to the clinic today for:   HPI    Retina Evaluation    In both eyes.  I, the attending physician,  performed the HPI with the patient and updated documentation appropriately.          Comments    Dr. Kathlen Mody referred patient for Diabetic Retinopathy OU.  Vision has not been affected states patient.  DM2 x35 years  BS: 84 this am A1C: unsure Latanoprost qhs OD  Using a drop qd OS for shingles but unsure the name of it.         Last edited by Bernarda Caffey, MD on 05/09/2020  5:16 PM. (History)    Using Cosopt QD OS  Referring physician: Hortencia Pilar, MD [place],  [place] [address]  HISTORICAL INFORMATION:   Selected notes from the MEDICAL RECORD NUMBER Referred by Dr. Kathlen Mody for DEE   CURRENT MEDICATIONS: Current Outpatient Medications (Ophthalmic Drugs)  Medication Sig  . Carboxymethylcellul-Glycerin (LUBRICATING EYE DROPS OP) Apply 1 drop to eye daily as needed (itching eyes).  . dorzolamide-timolol (COSOPT) 22.3-6.8 MG/ML ophthalmic solution Place 1 drop into the left eye 2 (two) times daily.  [name] latanoprost (XALATAN) 0.005 % ophthalmic solution Place 1 drop into both eyes at bedtime.   No current facility-administered medications for this visit. (Ophthalmic Drugs)   Current Outpatient Medications (Other)  Medication Sig  . amLODipine (NORVASC) 10 MG tablet Take 1 tablet (10 mg total) by mouth daily.  . carvedilol (COREG) 6.25 MG tablet Take 1 tablet (6.25 mg total) by mouth 2 (two) times daily.  [name] ELIQUIS 5 MG TABS tablet TAKE 1 TABLET TWICE A DAY  . furosemide (LASIX) 40 MG tablet Take 1 tablet (40 mg total) by mouth daily.  [name] gemfibrozil (LOPID) 600 MG tablet Take 600 mg by mouth at bedtime.   [name] glimepiride (AMARYL) 2 MG tablet Take 2 mg by mouth  2 (two) times daily.   . Insulin Glargine (BASAGLAR KWIKPEN) 100 UNIT/ML SOPN Inject 40 Units into the skin at bedtime.   [name] lisinopril (ZESTRIL) 40 MG tablet TAKE 1 TABLET BY MOUTH EVERY DAY  . metFORMIN (GLUCOPHAGE-XR) 500 MG 24 hr tablet Take 1,000 mg by mouth 2 (two) times daily.  . valACYclovir (VALTREX) 1000 MG tablet Take 1,000 mg by mouth daily.    No current facility-administered medications for this visit. (Other)      REVIEW OF SYSTEMS: ROS    Positive for: Genitourinary, Endocrine, Cardiovascular, Eyes, Respiratory, Heme/Lymph   Negative for: Constitutional, Gastrointestinal, Neurological, Skin, Musculoskeletal, HENT, Psychiatric, Allergic/Imm   Last edited by Leonie Douglas, COA on 05/09/2020  1:44 PM. (History)       ALLERGIES Allergies  Allergen Reactions  . Tape Rash and Other (See Comments)    Causes skin redness, Use paper tape only.    PAST MEDICAL HISTORY Past Medical History:  Diagnosis Date  . Arthritis    "hands probably" (05/04/2015)  . CAD (coronary artery disease)    a. Cath 03/17/15 showing 100% ostial D1, 50% prox LAD to mid LAD, 40% RPDA stenosis. Med rx. // Myoview 01/2020: EF 31 diffuse perfusion defect without reversibility (suspect artifact); reviewed with Dr. Aggie Cosier study felt to be low risk  . Chronic diastolic CHF (congestive heart failure) ([place])  Echocardiogram 01/2020: EF 55-60, no RWMA, moderate LVH, GR 1 DD, normal RV SF, RVSP 28.2, mild MR, mild aortic valve sclerosis (no aortic stenosis)  . Glaucoma   . History of gout   . Hyperlipidemia   . Hypertension   . Kidney stones    "found during prostate OR; still in there as far as I know" (05/04/2015)  . Melanoma of neck ([facility])    "right; came back IV; no chemo"  . NICM (nonischemic cardiomyopathy) ([place])   . OSA on CPAP 2012  . Paroxysmal atrial fibrillation (HCC)    a. 04/2015 Tikosyn loading, DCCV x 1;  b. CHA2DS2VASc = 5-->Eliquis.  . Prostate cancer ([place])   . Type II diabetes  mellitus ([place])    Past Surgical History:  Procedure Laterality Date  . ABDOMINAL HERNIA REPAIR     w/mesh  . CARDIAC CATHETERIZATION N/A 03/17/2015   Procedure: Left Heart Cath and Coronary Angiography;  Surgeon: Lorretta Harp, MD;  Location: [facility];  Service: Cardiovascular;  Laterality: N/A;  . carotid doppler  09/17/2008   rigt and left ICAs 0-49%;mildly  abnormal  . COLONOSCOPY N/A 05/16/2017   Procedure: COLONOSCOPY;  Surgeon: Rogene Houston, MD;  [facility];  Service: Endoscopy;  Laterality: N/A;  930  . DOPPLER ECHOCARDIOGRAPHY  05/25/2009   EF 50-55%,LA mildly dilated, LV function normal  . ELECTROPHYSIOLOGIC STUDY N/A 04/05/2015   Procedure: Cardioversion;  Surgeon: Sanda Klein, MD;  Location: [facility];  Service: Cardiovascular;  Laterality: N/A;  . ELECTROPHYSIOLOGIC STUDY N/A 09/06/2015   Procedure: Atrial Fibrillation Ablation;  Surgeon: Thompson Grayer, MD;  Location: [facility];  Service: Cardiovascular;  Laterality: N/A;  . ELECTROPHYSIOLOGIC STUDY N/A 07/12/2016   redo afib ablation by Dr Rayann Heman  . EXCISIONAL HEMORRHOIDECTOMY     "inside and out"  . FINE NEEDLE ASPIRATION Right    knee; "drew ~ 1 quart off"  . HERNIA REPAIR    . LAPAROSCOPIC CHOLECYSTECTOMY    . MELANOMA EXCISION Right    "neck"  . NM MYOCAR PERF WALL MOTION  02/21/2012   EF 61% ,EXERCISE 7 METS. exercise stopped due to wheezing and shortness of breathe  . POLYPECTOMY  05/16/2017   Procedure: POLYPECTOMY;  Surgeon: Rogene Houston, MD;  [facility];  Service: Endoscopy;;  colon  . PROSTATECTOMY    . SHOULDER OPEN ROTATOR CUFF REPAIR Right X 2  . TEE WITHOUT CARDIOVERSION N/A 09/05/2015   Procedure: TRANSESOPHAGEAL ECHOCARDIOGRAM (TEE);  Surgeon: Sueanne Margarita, MD;  Location: [facility];  Service: Cardiovascular;  Laterality: N/A;    FAMILY HISTORY Family History  Problem Relation Age of Onset  . Heart disease Mother   . Lung cancer  Mother   . Heart attack Mother 9  . Stroke Brother   . Healthy Daughter   . Diabetes Father   . Heart disease Father     SOCIAL HISTORY Social History   Tobacco Use  . Smoking status: Former Smoker    Packs/day: 2.00    Years: 27.00    Pack years: 54.00    Types: Cigarettes    Quit date: 10/31/1983    Years since quitting: 36.5  . Smokeless tobacco: Never Used  Substance Use Topics  . Alcohol use: No    Alcohol/week: 0.0 standard drinks    Comment: "used to drink; stopped ~ 2008"  . Drug use: No         OPHTHALMIC EXAM:  Base Eye Exam    Visual Acuity (Snellen - Linear)      Right Left   Dist cc 20/40 -2 20/40 +2   Dist ph cc 20/30 -1 NI   Correction: Glasses       Tonometry (Tonopen, 2:06 PM)      Right Left   Pressure 23 21       Pupils      Dark Light Shape React APD   Right 3 2 Round Brisk None   Left 3 2 Round Brisk None       Visual Fields (Counting fingers)      Left Right    Full Full       Extraocular Movement      Right Left    Full Full       Neuro/Psych    Oriented x3: Yes   Mood/Affect: Normal       Dilation    Both eyes: 1.0% Mydriacyl, 2.5% Phenylephrine @ 2:06 PM        Slit Lamp and Fundus Exam    Slit Lamp Exam      Right Left   Lids/Lashes Dermato, mild MGD Dermato, mild MGD   Conjunctiva/Sclera Temporal pinguecula Temporal pinguecula   Cornea EBMD, mild haze, 1-2+ PEE EBMD, mild haze, 1-2+ PEE   Anterior Chamber Deep and clear; narrow temporal angle Deep and clear   Iris Round and dilated Round and dilated   Lens 2-3+ NS, 2-3+ cortical PCIOL, trace PCO   Vitreous Synerisis Synerisis       Fundus Exam      Right Left   Disc Pink, sharp, +cupping, central pallor Pink and sharp, +cupping, +PPA   C/D Ratio 0.7 0.6   Macula Flat, blunted foveal reflex, +central cystic changes, RPE mottling and clumping, rare MA Flat, blunted foveal reflex, clusters of fine MA nasal and superior to fovea.  +cystic changes   Vessels  Attenuated, mild tortuoisty Attenuated, mild av crossing changes   Periphery Focal dot heme below nerve (0600), attached, rare MA Attached.  No heme.        Refraction    Wearing Rx      Sphere Cylinder Axis Add   Right -0.75 +3.50 163 +2.50   Left -0.50 +1.25 170 +2.50       Manifest Refraction      Sphere Cylinder Axis Dist VA   Right -1.25 +2.25 160 20/30+1   Left -1.00 +1.75 180 20/30-1          IMAGING AND PROCEDURES  Imaging and Procedures for 05/09/2020  OCT, Retina - OU - Both Eyes       Right Eye Quality was good. Central Foveal Thickness: 320. Progression has no prior data. Findings include abnormal foveal contour, no SRF, vitreomacular adhesion , intraretinal fluid (Mild focal edema/IRF superior fovea).   Left Eye Quality was good. Central Foveal Thickness: 415. Progression has no prior data. Findings include abnormal foveal contour, intraretinal fluid, intraretinal hyper-reflective material (Focal IRF/edema nasal fovea/partial PVD).   Notes *Images captured and stored on drive  Diagnosis / Impression:  Mild DME OU (OS > OD) OD: Mild focal edema/IRF superior fovea OS: Focal IRF/edema nasal fovea; partial PVD  Clinical management:  See below  Abbreviations: NFP - Normal foveal profile. CME - cystoid macular edema. PED - pigment epithelial detachment. IRF - intraretinal fluid. SRF - subretinal fluid. EZ - ellipsoid zone. ERM - epiretinal membrane. ORA - outer retinal atrophy. ORT - outer retinal tubulation.  SRHM - subretinal hyper-reflective material. IRHM - intraretinal hyper-reflective material       Fluorescein Angiography Optos (Transit OS)       Right Eye   Progression has no prior data. Early phase findings include microaneurysm (Hazy images). Mid/Late phase findings include microaneurysm, leakage (Hazy images.  Single focal MA superior to fovea.).   Left Eye   Progression has no prior data. Early phase findings include delayed filling,  microaneurysm. Mid/Late phase findings include microaneurysm, leakage (Perifoveal Mas w/late leakage).   Notes **Images stored on drive**  Impression: Mild NPDR OU OD: Hazy images. Single focal MA superior to fovea. OS: Perifoveal Mas w/late leakage No NV OU        Intravitreal Injection, Pharmacologic Agent - OS - Left Eye       Time Out 05/09/2020. 4:20 PM. Confirmed correct patient, procedure, site, and patient consented.   Anesthesia Topical anesthesia was used. Anesthetic medications included Proparacaine 0.5%, Lidocaine 2%.   Procedure Preparation included 5% betadine to ocular surface, eyelid speculum. A supplied needle was used.   Injection:  1.25 mg Bevacizumab (AVASTIN) SOLN   NDC: 37048-889-16, Lot: [id number] , Expiration date: 06/08/2020   Route: Intravitreal, Site: Left Eye, Waste: 0 mL  Post-op Post injection exam found visual acuity of at least counting fingers. The patient tolerated the procedure well. There were no complications. The patient received written and verbal post procedure care education. Post injection medications were not given.                 ASSESSMENT/PLAN:    ICD-10-CM   1. 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, associated with type 2 diabetes mellitus (HCC)  06/21/2020 Fluorescein Angiography Optos (Transit OS)    Intravitreal Injection, Pharmacologic Agent - OS - Left Eye    Bevacizumab (AVASTIN) SOLN 1.25 mg  2. Retinal edema  H35.81 OCT, Retina - OU - Both Eyes  3. Essential hypertension  I10   4. Hypertensive retinopathy of both eyes  H35.033 Fluorescein Angiography Optos (Transit OS)  5. Combined forms of age-related cataract of right eye  H25.811   6. Pseudophakia of left eye  Z96.1   7. ABMD (anterior basement membrane dystrophy)  H18.529   8. History of herpes zoster of eye  Z86.19   9. Primary open angle glaucoma of both eyes, unspecified glaucoma stage  H40.1130     1,2.  Mild non-proliferative diabetic retinopathy OU (OS>OD) - The incidence, risk factors for progression, natural history and treatment options for diabetic retinopathy were discussed with patient.   - The need for close monitoring of blood glucose, blood pressure, and serum lipids, avoiding cigarette or any type of tobacco, and the need for long term follow up was also discussed with patient. - FA 7.26.21 OD: Hazy images.  Single focal MA superior to fovea.                                  OS: Perifoveal Mas w/late leakage - OCT shows diabetic macular edema OU (OS>OD) The natural history, pathology, and characteristics of diabetic macular edema discussed with patient.  A generalized discussion of the major clinical trials concerning treatment of diabetic macular edema (ETDRS, DCT, SCORE, RISE / RIDE, and ongoing DRCR net studies) was completed.  This discussion included mention of the various approaches to treating diabetic macular edema (observation, laser photocoagulation, anti-VEGF injections with lucentis / Avastin /  Eylea, steroid injections with Kenalog / Ozurdex, and intraocular surgery with vitrectomy).  The goal hemoglobin A1C of 6-7 was discussed, as well as importance of smoking cessation and hypertension control.  Need for ongoing treatment and monitoring were specifically discussed with reference to chronic nature of diabetic macular edema. - recommend IVA #1 OS today, 7.26.21 - pt wishes to proceed - RBA of procedure discussed, questions answered - informed consent obtained and signed - see procedure note - f/u in 4 wks -- DFE/OCT, possible injection  3,4. Hypertensive retinopathy OU - discussed importance of tight BP control - monitor  5. Mixed Cataract OD - The symptoms of cataract, surgical options, and treatments and risks were discussed with patient. - discussed diagnosis and progression - not yet visually significant - monitor for now  6. Pseudophakia OS  - s/p CE/IOL OS  (2020, Dr. Kathlen Mody)  - IOL in good position, doing well  - monitor  7. 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 Roby Lofts at [facility] W/S, who recommended SuperK prior to [place]  8. History of herpes zoster/iritis OS  - on valtrex 1g daily -- maintenance   9. POAG OU  - under the expert management of Dr. Kathlen Mody  - IOP 23, 21 today  - on latan qhs OD, cosopt BID OS   Ophthalmic Meds Ordered this visit:  Meds ordered this encounter  Medications  . Bevacizumab (AVASTIN) SOLN 1.25 mg       Return in about 4 weeks (around 06/06/2020) for 4 wk f/u for NPDR OU w/DFE&OCT.  There are no Patient Instructions on file for this visit.   Explained the diagnoses, plan, and follow up with the patient and they expressed understanding.  Patient expressed understanding of the importance of proper follow up care.  This document serves as a record of services personally performed by Gardiner Sleeper, MD, PhD. It was created on their behalf by Estill Bakes, COT an ophthalmic technician. The creation of this record is the provider's dictation and/or activities during the visit.    Electronically signed by: Estill Bakes, COT 05/06/20 @ 5:36 PM   This document serves as a record of services personally performed by Gardiner Sleeper, MD, PhD. It was created on their behalf by Estill Bakes, COT an ophthalmic technician. The creation of this record is the provider's dictation and/or activities during the visit.    Electronically signed by: Estill Bakes, COT 05/09/20 @ 5:36 PM   Gardiner Sleeper, M.D., Ph.D. Diseases & Surgery of the Retina and Vitreous [facility]  I have reviewed the above documentation for accuracy and completeness, and I agree with the above. Gardiner Sleeper, M.D., Ph.D. 05/09/20 5:36 PM    Abbreviations: M myopia (nearsighted); A astigmatism; H hyperopia (farsighted); P presbyopia; Mrx spectacle prescription;  CTL contact  lenses; OD right eye; OS left eye; OU both eyes  XT exotropia; ET esotropia; PEK punctate epithelial keratitis; PEE punctate epithelial erosions; DES dry eye syndrome; MGD meibomian gland dysfunction; ATs artificial tears; PFAT's preservative free artificial tears; [place] nuclear sclerotic cataract; PSC posterior subcapsular cataract; ERM epi-retinal membrane; PVD posterior vitreous detachment; RD retinal detachment; DM diabetes mellitus; DR diabetic retinopathy; NPDR non-proliferative diabetic retinopathy; PDR proliferative diabetic retinopathy; CSME clinically significant macular edema; DME diabetic macular edema; dbh dot blot hemorrhages; CWS cotton wool spot; POAG primary open angle glaucoma; C/D cup-to-disc ratio; HVF humphrey visual field; GVF  goldmann visual field; OCT optical coherence tomography; IOP intraocular pressure; BRVO Branch retinal vein occlusion; CRVO central retinal vein occlusion; CRAO central retinal artery occlusion; BRAO branch retinal artery occlusion; RT retinal tear; SB scleral buckle; PPV pars plana vitrectomy; VH Vitreous hemorrhage; PRP panretinal laser photocoagulation; IVK intravitreal kenalog; VMT vitreomacular traction; MH Macular hole;  NVD neovascularization of the disc; NVE neovascularization elsewhere; AREDS age related eye disease study; ARMD age related macular degeneration; POAG primary open angle glaucoma; EBMD epithelial/anterior basement membrane dystrophy; ACIOL anterior chamber intraocular lens; IOL intraocular lens; PCIOL posterior chamber intraocular lens; Phaco/IOL phacoemulsification with intraocular lens placement; [place] photorefractive keratectomy; LASIK laser assisted in situ keratomileusis; HTN hypertension; DM diabetes mellitus; COPD chronic obstructive pulmonary disease

## 2020-05-09 ENCOUNTER — Ambulatory Visit (INDEPENDENT_AMBULATORY_CARE_PROVIDER_SITE_OTHER): Payer: Medicare Other | Admitting: Ophthalmology

## 2020-05-09 ENCOUNTER — Other Ambulatory Visit: Payer: Self-pay

## 2020-05-09 ENCOUNTER — Encounter (INDEPENDENT_AMBULATORY_CARE_PROVIDER_SITE_OTHER): Payer: Self-pay | Admitting: Ophthalmology

## 2020-05-09 DIAGNOSIS — H3581 Retinal edema: Secondary | ICD-10-CM | POA: Diagnosis not present

## 2020-05-09 DIAGNOSIS — H35033 Hypertensive retinopathy, bilateral: Secondary | ICD-10-CM | POA: Diagnosis not present

## 2020-05-09 DIAGNOSIS — Z961 Presence of intraocular lens: Secondary | ICD-10-CM

## 2020-05-09 DIAGNOSIS — H401130 Primary open-angle glaucoma, bilateral, stage unspecified: Secondary | ICD-10-CM

## 2020-05-09 DIAGNOSIS — E113213 Type 2 diabetes mellitus with mild nonproliferative diabetic retinopathy with macular edema, bilateral: Secondary | ICD-10-CM

## 2020-05-09 DIAGNOSIS — Z8619 Personal history of other infectious and parasitic diseases: Secondary | ICD-10-CM

## 2020-05-09 DIAGNOSIS — I10 Essential (primary) hypertension: Secondary | ICD-10-CM | POA: Diagnosis not present

## 2020-05-09 DIAGNOSIS — H25811 Combined forms of age-related cataract, right eye: Secondary | ICD-10-CM

## 2020-05-09 DIAGNOSIS — H18529 Epithelial (juvenile) corneal dystrophy, unspecified eye: Secondary | ICD-10-CM

## 2020-05-09 MED ORDER — BEVACIZUMAB CHEMO INJECTION 1.25MG/0.05ML SYRINGE FOR KALEIDOSCOPE
1.2500 mg | INTRAVITREAL | Status: AC | PRN
  Administered 2020-05-09: 1.25 mg via INTRAVITREAL

## 2020-05-12 ENCOUNTER — Ambulatory Visit: Payer: Medicare Other | Admitting: Orthopaedic Surgery

## 2020-05-17 ENCOUNTER — Ambulatory Visit: Payer: Medicare Other | Admitting: Orthopaedic Surgery

## 2020-05-17 ENCOUNTER — Encounter: Payer: Self-pay | Admitting: Orthopaedic Surgery

## 2020-05-17 ENCOUNTER — Other Ambulatory Visit: Payer: Self-pay

## 2020-05-17 VITALS — BP 166/71 | HR 58 | Ht 70.0 in | Wt 240.0 lb

## 2020-05-17 DIAGNOSIS — M79605 Pain in left leg: Secondary | ICD-10-CM

## 2020-05-17 DIAGNOSIS — M545 Low back pain, unspecified: Secondary | ICD-10-CM

## 2020-05-17 NOTE — Progress Notes (Signed)
I am not hurting now  He has no lower back pain.  He is walking and doing well.  He did change his mattress and thinks that helped the most.  NV intact.  Encounter Diagnosis  Name Primary?  . Lumbar pain with radiation down left leg Yes   Discharge.  I will see as needed.  Call if any problem.  Precautions discussed.   Electronically Signed Sanjuana Kava, MD 8/3/20212:03 PM

## 2020-06-03 NOTE — Progress Notes (Signed)
[facility] Note  06/07/2020     CHIEF COMPLAINT Patient presents for Retina Follow Up   HISTORY OF PRESENT ILLNESS: Lucas Dudley is a 80 y.o. male who presents to the clinic today for:   HPI    Retina Follow Up    Patient presents with  Diabetic Retinopathy.  In both eyes.  This started weeks ago.  Severity is moderate.  Duration of weeks.  Since onset it is stable.  I, the attending physician,  performed the HPI with the patient and updated documentation appropriately.          Comments    Patient states his vision is about the same OU.  Patient denies eye pain or discomfort.  Patient wakes up in AM and has matter or crusting around OS but not around OD.  Patient wonders if this is related to Dorzolamide/Timolol use OS.  Patient denies any new or worsening floaters or fol OU.       Last edited by Bernarda Caffey, MD on 06/07/2020  1:49 PM. (History)    pt states he cannot tell any difference in vision since last visit  Referring physician: Hortencia Pilar, MD [place],  [place] [address]  HISTORICAL INFORMATION:   Selected notes from the MEDICAL RECORD NUMBER Referred by Dr. Kathlen Mody for DEE   CURRENT MEDICATIONS: Current Outpatient Medications (Ophthalmic Drugs)  Medication Sig  . Carboxymethylcellul-Glycerin (LUBRICATING EYE DROPS OP) Apply 1 drop to eye daily as needed (itching eyes).  . dorzolamide-timolol (COSOPT) 22.3-6.8 MG/ML ophthalmic solution Place 1 drop into both eyes 2 (two) times daily.  [name] latanoprost (XALATAN) 0.005 % ophthalmic solution Place 1 drop into both eyes at bedtime.   No current facility-administered medications for this visit. (Ophthalmic Drugs)   Current Outpatient Medications (Other)  Medication Sig  . amLODipine (NORVASC) 10 MG tablet Take 1 tablet (10 mg total) by mouth daily.  . carvedilol (COREG) 6.25 MG tablet Take 1 tablet (6.25 mg total) by mouth 2 (two) times daily.  [name] ELIQUIS 5  MG TABS tablet TAKE 1 TABLET TWICE A DAY  . furosemide (LASIX) 40 MG tablet Take 1 tablet (40 mg total) by mouth daily.  [name] gemfibrozil (LOPID) 600 MG tablet Take 600 mg by mouth at bedtime.   [name] glimepiride (AMARYL) 2 MG tablet Take 2 mg by mouth 2 (two) times daily.   . Insulin Glargine (BASAGLAR KWIKPEN) 100 UNIT/ML SOPN Inject 40 Units into the skin at bedtime.   [name] lisinopril (ZESTRIL) 40 MG tablet TAKE 1 TABLET BY MOUTH EVERY DAY  . metFORMIN (GLUCOPHAGE-XR) 500 MG 24 hr tablet Take 1,000 mg by mouth 2 (two) times daily.  . valACYclovir (VALTREX) 1000 MG tablet Take 1,000 mg by mouth daily.    No current facility-administered medications for this visit. (Other)   REVIEW OF SYSTEMS: ROS    Positive for: Genitourinary, Endocrine, Cardiovascular, Eyes, Respiratory, Heme/Lymph   Negative for: Constitutional, Gastrointestinal, Neurological, Skin, Musculoskeletal, HENT, Psychiatric, Allergic/Imm   Last edited by Doneen Poisson on 06/07/2020  1:24 PM. (History)     ALLERGIES Allergies  Allergen Reactions  . Tape Rash and Other (See Comments)    Causes skin redness, Use paper tape only.   PAST MEDICAL HISTORY Past Medical History:  Diagnosis Date  . Arthritis    "hands probably" (05/04/2015)  . CAD (coronary artery disease)    a. Cath 03/17/15 showing 100% ostial D1, 50% prox LAD to  mid LAD, 40% RPDA stenosis. Med rx. // Myoview 01/2020: EF 31 diffuse perfusion defect without reversibility (suspect artifact); reviewed with Dr. Aggie Cosier study felt to be low risk  . Chronic diastolic CHF (congestive heart failure) (HCC)    Echocardiogram 01/2020: EF 55-60, no RWMA, moderate LVH, GR 1 DD, normal RV SF, RVSP 28.2, mild MR, mild aortic valve sclerosis (no aortic stenosis)  . Glaucoma   . History of gout   . Hyperlipidemia   . Hypertension   . Kidney stones    "found during prostate OR; still in there as far as I know" (05/04/2015)  . Melanoma of neck ([place])    "right; came back IV; no  chemo"  . NICM (nonischemic cardiomyopathy) ([place])   . OSA on CPAP 2012  . Paroxysmal atrial fibrillation (HCC)    a. 04/2015 Tikosyn loading, DCCV x 1;  b. CHA2DS2VASc = 5-->Eliquis.  . Prostate cancer ([place])   . Type II diabetes mellitus ([place])    Past Surgical History:  Procedure Laterality Date  . ABDOMINAL HERNIA REPAIR     w/mesh  . CARDIAC CATHETERIZATION N/A 03/17/2015   Procedure: Left Heart Cath and Coronary Angiography;  Surgeon: Lorretta Harp, MD;  Location: [facility];  Service: Cardiovascular;  Laterality: N/A;  . carotid doppler  09/17/2008   rigt and left ICAs 0-49%;mildly  abnormal  . COLONOSCOPY N/A 05/16/2017   Procedure: COLONOSCOPY;  Surgeon: Rogene Houston, MD;  [facility];  Service: Endoscopy;  Laterality: N/A;  930  . DOPPLER ECHOCARDIOGRAPHY  05/25/2009   EF 50-55%,LA mildly dilated, LV function normal  . ELECTROPHYSIOLOGIC STUDY N/A 04/05/2015   Procedure: Cardioversion;  Surgeon: Sanda Klein, MD;  Location: [facility];  Service: Cardiovascular;  Laterality: N/A;  . ELECTROPHYSIOLOGIC STUDY N/A 09/06/2015   Procedure: Atrial Fibrillation Ablation;  Surgeon: Thompson Grayer, MD;  Location: [facility];  Service: Cardiovascular;  Laterality: N/A;  . ELECTROPHYSIOLOGIC STUDY N/A 07/12/2016   redo afib ablation by Dr Rayann Heman  . EXCISIONAL HEMORRHOIDECTOMY     "inside and out"  . FINE NEEDLE ASPIRATION Right    knee; "drew ~ 1 quart off"  . HERNIA REPAIR    . LAPAROSCOPIC CHOLECYSTECTOMY    . MELANOMA EXCISION Right    "neck"  . NM MYOCAR PERF WALL MOTION  02/21/2012   EF 61% ,EXERCISE 7 METS. exercise stopped due to wheezing and shortness of breathe  . POLYPECTOMY  05/16/2017   Procedure: POLYPECTOMY;  Surgeon: Rogene Houston, MD;  [facility];  Service: Endoscopy;;  colon  . PROSTATECTOMY    . SHOULDER OPEN ROTATOR CUFF REPAIR Right X 2  . TEE WITHOUT CARDIOVERSION N/A 09/05/2015   Procedure: TRANSESOPHAGEAL  ECHOCARDIOGRAM (TEE);  Surgeon: Sueanne Margarita, MD;  [facility];  Service: Cardiovascular;  Laterality: N/A;   FAMILY HISTORY Family History  Problem Relation Age of Onset  . Heart disease Mother   . Lung cancer Mother   . Heart attack Mother 81  . Stroke Brother   . Healthy Daughter   . Diabetes Father   . Heart disease Father    SOCIAL HISTORY Social History   Tobacco Use  . Smoking status: Former Smoker    Packs/day: 2.00    Years: 27.00    Pack years: 54.00    Types: Cigarettes    Quit date: 10/31/1983    Years since quitting: 36.6  . Smokeless tobacco: Never Used  Substance Use  Topics  . Alcohol use: No    Alcohol/week: 0.0 standard drinks    Comment: "used to drink; stopped ~ 2008"  . Drug use: No         OPHTHALMIC EXAM:  Base Eye Exam    Visual Acuity (Snellen - Linear)      Right Left   Dist cc 20/40 -2 20/30 +1   Dist ph cc 20/30 -2 NI   Correction: Glasses       Tonometry (Tonopen, 1:29 PM)      Right Left   Pressure 22 17       Pupils      Dark Light Shape React APD   Right 3 2 Round Brisk 0   Left 3 2 Round Brisk 0       Visual Fields      Left Right    Full Full       Extraocular Movement      Right Left    Full Full       Neuro/Psych    Oriented x3: Yes   Mood/Affect: Normal       Dilation    Both eyes: 1.0% Mydriacyl, 2.5% Phenylephrine @ 1:29 PM        Slit Lamp and Fundus Exam    Slit Lamp Exam      Right Left   Lids/Lashes Dermato, mild MGD Dermato, mild MGD   Conjunctiva/Sclera Temporal pinguecula Temporal pinguecula   Cornea EBMD, mild haze, 1-2+ PEE EBMD, mild haze, 1-2+ PEE   Anterior Chamber Deep and clear; narrow temporal angle Deep and clear   Iris Round and dilated Round and dilated   Lens 2-3+ NS, 2-3+ cortical PCIOL, trace PCO   Vitreous Synerisis Synerisis       Fundus Exam      Right Left   Disc Pink, sharp, +cupping, central pallor Pink and sharp, +cupping, +PPA   C/D Ratio 0.7 0.6    Macula Flat, blunted foveal reflex, +central cystic changes, RPE mottling and clumping, rare MA Flat, blunted foveal reflex, clusters of fine MA nasal and superior to fovea, +cystic changes -- mild improvement from prior, small CWS superior mac   Vessels Attenuated, mild tortuoisty Attenuated, mild av crossing changes   Periphery Focal dot heme below nerve (0600), attached, rare MA Attached.  No heme.        Refraction    Wearing Rx      Sphere Cylinder Axis Add   Right -0.75 +3.50 163 +2.50   Left -0.50 +1.25 170 +2.50          IMAGING AND PROCEDURES  Imaging and Procedures for 06/07/2020  OCT, Retina - OU - Both Eyes       Right Eye Quality was borderline. Central Foveal Thickness: 314. Progression has been stable. Findings include abnormal foveal contour, no SRF, vitreomacular adhesion , intraretinal fluid (Mild focal edema/IRF superior fovea).   Left Eye Quality was good. Central Foveal Thickness: 382. Progression has improved. Findings include abnormal foveal contour, intraretinal fluid, intraretinal hyper-reflective material (Mild interval improvement in focal IRF/edema nasal fovea; partial PVD).   Notes *Images captured and stored on drive  Diagnosis / Impression:  Mild DME OU (OS > OD) OD: Mild focal edema/IRF superior fovea OS: Mild interval improvement in focal IRF/edema nasal fovea; partial PVD  Clinical management:  See below  Abbreviations: NFP - Normal foveal profile. CME - cystoid macular edema. PED - pigment epithelial detachment. IRF - intraretinal fluid. SRF - subretinal  fluid. EZ - ellipsoid zone. ERM - epiretinal membrane. ORA - outer retinal atrophy. ORT - outer retinal tubulation. SRHM - subretinal hyper-reflective material. IRHM - intraretinal hyper-reflective material       Intravitreal Injection, Pharmacologic Agent - OS - Left Eye       Time Out 06/07/2020. 1:46 PM. Confirmed correct patient, procedure, site, and patient consented.    Anesthesia Topical anesthesia was used. Anesthetic medications included Proparacaine 0.5%, Lidocaine 2%.   Procedure Preparation included 5% betadine to ocular surface, eyelid speculum. A supplied needle was used.   Injection:  1.25 mg Bevacizumab (AVASTIN) SOLN   NDC: 09470-962-83, Lot: [id number] , Expiration date: 07/31/2020   Route: Intravitreal, Site: Left Eye, Waste: 0 mL  Post-op Post injection exam found visual acuity of at least counting fingers. The patient tolerated the procedure well. There were no complications. The patient received written and verbal post procedure care education. Post injection medications were not given.                 ASSESSMENT/PLAN:    ICD-10-CM   1. 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, associated with type 2 diabetes mellitus (HCC)  M62.9476 Intravitreal Injection, Pharmacologic Agent - OS - Left Eye    Bevacizumab (AVASTIN) SOLN 1.25 mg  2. Retinal edema  H35.81 OCT, Retina - OU - Both Eyes  3. Essential hypertension  I10   4. Hypertensive retinopathy of both eyes  H35.033   5. Combined forms of age-related cataract of right eye  H25.811   6. Pseudophakia of left eye  Z96.1   7. ABMD (anterior basement membrane dystrophy)  H18.529   8. History of herpes zoster of eye  Z86.19   9. Primary open angle glaucoma of both eyes, unspecified glaucoma stage  H40.1130     1,2. 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  Single focal MA superior to fovea.                                  OS: Perifoveal Mas w/late leakage - OCT shows diabetic macular edema OU (OS>OD) - s/p IVA OS # 1(07.26.21) - BCVA OS improved to 20/30 from 20/40\ - OCT shows interval improvement in IRF/DME OS - recommend IVA #2 OS today, 08.24.21 - pt wishes to proceed - RBA of procedure discussed, questions answered - informed consent obtained and signed - see procedure note -  f/u in 4 wks -- DFE/OCT, possible injection  3,4. Hypertensive retinopathy OU - discussed importance of tight BP control - monitor  5. Mixed Cataract OD - The symptoms of cataract, surgical options, and treatments and risks were discussed with patient. - discussed diagnosis and progression - under the expert management of Dr. Kathlen Mody  6. Pseudophakia OS  - s/p CE/IOL OS (2020, Dr. Kathlen Mody)  - IOL in good position, doing well  - monitor  7. 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 Roby Lofts at [facility]/S, who recommended SuperK prior to [place]  8. History of herpes zoster/iritis OS  - on valtrex 1g daily -- maintenance   9. POAG OU  - under the expert management of Dr. Kathlen Mody  - IOP 22,17 today  - on latan qhs OD, cosopt BID OS  - recommend adding cosopt BID to OD   Ophthalmic Meds Ordered this  visit:  Meds ordered this encounter  Medications  . dorzolamide-timolol (COSOPT) 22.3-6.8 MG/ML ophthalmic solution    Sig: Place 1 drop into both eyes 2 (two) times daily.    Dispense:  10 mL    Refill:  3  . Bevacizumab (AVASTIN) SOLN 1.25 mg      Return in about 4 weeks (around 07/05/2020) for f/u NPDR OU, DFE, OCT.  There are no Patient Instructions on file for this visit.  Explained the diagnoses, plan, and follow up with the patient and they expressed understanding.  Patient expressed understanding of the importance of proper follow up care.  This document serves as a record of services personally performed by Gardiner Sleeper, MD, PhD. It was created on their behalf by Estill Bakes, COT an ophthalmic technician. The creation of this record is the provider's dictation and/or activities during the visit.    Electronically signed by: Estill Bakes, COT 8.20.21 @ 4:01 PM   This document serves as a record of services personally performed by Gardiner Sleeper, MD, PhD. It was created on their behalf by San Jetty. Owens Shark, OA an ophthalmic technician.  The creation of this record is the provider's dictation and/or activities during the visit.    Electronically signed by: San Jetty. Owens Shark, [place] 08.24.2021 4:01 PM  Gardiner Sleeper, M.D., Ph.D. Diseases & Surgery of the Retina and [place] 06/07/2020   I have reviewed the above documentation for accuracy and completeness, and I agree with the above. Gardiner Sleeper, M.D., Ph.D. 06/07/20 4:01 PM    Abbreviations: M myopia (nearsighted); A astigmatism; H hyperopia (farsighted); P presbyopia; Mrx spectacle prescription;  CTL contact lenses; OD right eye; OS left eye; OU both eyes  XT exotropia; ET esotropia; PEK punctate epithelial keratitis; PEE punctate epithelial erosions; DES dry eye syndrome; MGD meibomian gland dysfunction; ATs artificial tears; PFAT's preservative free artificial tears; [place] nuclear sclerotic cataract; PSC posterior subcapsular cataract; ERM epi-retinal membrane; PVD posterior vitreous detachment; RD retinal detachment; DM diabetes mellitus; DR diabetic retinopathy; NPDR non-proliferative diabetic retinopathy; PDR proliferative diabetic retinopathy; CSME clinically significant macular edema; DME diabetic macular edema; dbh dot blot hemorrhages; CWS cotton wool spot; POAG primary open angle glaucoma; C/D cup-to-disc ratio; HVF humphrey visual field; GVF goldmann visual field; OCT optical coherence tomography; IOP intraocular pressure; BRVO Branch retinal vein occlusion; CRVO central retinal vein occlusion; CRAO central retinal artery occlusion; BRAO branch retinal artery occlusion; RT retinal tear; SB scleral buckle; PPV pars plana vitrectomy; VH Vitreous hemorrhage; PRP panretinal laser photocoagulation; IVK intravitreal kenalog; VMT vitreomacular traction; MH Macular hole;  NVD neovascularization of the disc; NVE neovascularization elsewhere; AREDS age related eye disease study; ARMD age related macular degeneration; POAG primary open angle glaucoma;  EBMD epithelial/anterior basement membrane dystrophy; ACIOL anterior chamber intraocular lens; IOL intraocular lens; PCIOL posterior chamber intraocular lens; Phaco/IOL phacoemulsification with intraocular lens placement; [place] photorefractive keratectomy; LASIK laser assisted in situ keratomileusis; HTN hypertension; DM diabetes mellitus; COPD chronic obstructive pulmonary disease

## 2020-06-07 ENCOUNTER — Encounter (INDEPENDENT_AMBULATORY_CARE_PROVIDER_SITE_OTHER): Payer: Self-pay | Admitting: Ophthalmology

## 2020-06-07 ENCOUNTER — Ambulatory Visit (INDEPENDENT_AMBULATORY_CARE_PROVIDER_SITE_OTHER): Payer: Medicare Other | Admitting: Ophthalmology

## 2020-06-07 ENCOUNTER — Other Ambulatory Visit: Payer: Self-pay

## 2020-06-07 DIAGNOSIS — E113213 Type 2 diabetes mellitus with mild nonproliferative diabetic retinopathy with macular edema, bilateral: Secondary | ICD-10-CM

## 2020-06-07 DIAGNOSIS — H35033 Hypertensive retinopathy, bilateral: Secondary | ICD-10-CM | POA: Diagnosis not present

## 2020-06-07 DIAGNOSIS — H3581 Retinal edema: Secondary | ICD-10-CM

## 2020-06-07 DIAGNOSIS — I10 Essential (primary) hypertension: Secondary | ICD-10-CM

## 2020-06-07 DIAGNOSIS — H18529 Epithelial (juvenile) corneal dystrophy, unspecified eye: Secondary | ICD-10-CM

## 2020-06-07 DIAGNOSIS — Z8619 Personal history of other infectious and parasitic diseases: Secondary | ICD-10-CM

## 2020-06-07 DIAGNOSIS — Z961 Presence of intraocular lens: Secondary | ICD-10-CM

## 2020-06-07 DIAGNOSIS — H401130 Primary open-angle glaucoma, bilateral, stage unspecified: Secondary | ICD-10-CM

## 2020-06-07 DIAGNOSIS — H25811 Combined forms of age-related cataract, right eye: Secondary | ICD-10-CM

## 2020-06-07 MED ORDER — BEVACIZUMAB CHEMO INJECTION 1.25MG/0.05ML SYRINGE FOR KALEIDOSCOPE
1.2500 mg | INTRAVITREAL | Status: AC | PRN
  Administered 2020-06-07: 1.25 mg via INTRAVITREAL

## 2020-06-07 MED ORDER — DORZOLAMIDE HCL-TIMOLOL MAL 2-0.5 % OP SOLN: 1.0000 [drp] | Freq: Two times a day (BID) | OPHTHALMIC | 3 refills | Status: DC

## 2020-07-04 NOTE — Progress Notes (Signed)
[facility] Note  07/05/2020     CHIEF COMPLAINT Patient presents for Retina Follow Up   HISTORY OF PRESENT ILLNESS: Lucas Dudley is a 79 y.o. male who presents to the clinic today for:   HPI    Retina Follow Up    Patient presents with  Diabetic Retinopathy.  In both eyes.  This started 4 weeks ago.  Severity is moderate.  I, the attending physician,  performed the HPI with the patient and updated documentation appropriately.          Comments    Patient here for 4 weeks retina follow up for NPDR OU. Patient states vision can't tell andy difference. OD getting worse-can't read from distance with both eye like used to. No eye pain. Hurts after has injection but went away. Uses drops Latanaprost QHS OD and Dorzolamide/Timolol QHS OU.       Last edited by Bernarda Caffey, MD on 07/05/2020  5:06 PM. (History)    pt states vision seems to be the same  Referring physician: Hortencia Pilar, MD [place],  [place] [id number]  HISTORICAL INFORMATION:   Selected notes from the MEDICAL RECORD NUMBER Referred by Dr. Kathlen Mody for DEE   CURRENT MEDICATIONS: Current Outpatient Medications (Ophthalmic Drugs)  Medication Sig  . Carboxymethylcellul-Glycerin (LUBRICATING EYE DROPS OP) Apply 1 drop to eye daily as needed (itching eyes).  . dorzolamide-timolol (COSOPT) 22.3-6.8 MG/ML ophthalmic solution Place 1 drop into both eyes 2 (two) times daily.  [name] latanoprost (XALATAN) 0.005 % ophthalmic solution Place 1 drop into both eyes at bedtime.   No current facility-administered medications for this visit. (Ophthalmic Drugs)   Current Outpatient Medications (Other)  Medication Sig  . amLODipine (NORVASC) 10 MG tablet Take 1 tablet (10 mg total) by mouth daily.  . carvedilol (COREG) 6.25 MG tablet Take 1 tablet (6.25 mg total) by mouth 2 (two) times daily.  [name] ELIQUIS 5 MG TABS tablet TAKE 1 TABLET TWICE A DAY  . furosemide (LASIX) 40 MG tablet  Take 1 tablet (40 mg total) by mouth daily.  [name] gemfibrozil (LOPID) 600 MG tablet Take 600 mg by mouth at bedtime.   [name] glimepiride (AMARYL) 2 MG tablet Take 2 mg by mouth 2 (two) times daily.   . Insulin Glargine (BASAGLAR KWIKPEN) 100 UNIT/ML SOPN Inject 40 Units into the skin at bedtime.   [name] lisinopril (ZESTRIL) 40 MG tablet TAKE 1 TABLET BY MOUTH EVERY DAY  . metFORMIN (GLUCOPHAGE-XR) 500 MG 24 hr tablet Take 1,000 mg by mouth 2 (two) times daily.  . valACYclovir (VALTREX) 1000 MG tablet Take 1,000 mg by mouth daily.    No current facility-administered medications for this visit. (Other)   REVIEW OF SYSTEMS: ROS    Positive for: Genitourinary, Endocrine, Cardiovascular, Eyes, Respiratory, Heme/Lymph   Negative for: Constitutional, Gastrointestinal, Neurological, Skin, Musculoskeletal, HENT, Psychiatric, Allergic/Imm   Last edited by Theodore Demark, COA on 07/05/2020  1:27 PM. (History)     ALLERGIES Allergies  Allergen Reactions  . Tape Rash and Other (See Comments)    Causes skin redness, Use paper tape only.   PAST MEDICAL HISTORY Past Medical History:  Diagnosis Date  . Arthritis    "hands probably" (05/04/2015)  . CAD (coronary artery disease)    a. Cath 03/17/15 showing 100% ostial D1, 50% prox LAD to mid LAD, 40% RPDA stenosis. Med rx. // Myoview 01/2020: EF 31 diffuse perfusion defect without reversibility (suspect  artifact); reviewed with Dr. Aggie Cosier study felt to be low risk  . Chronic diastolic CHF (congestive heart failure) (HCC)    Echocardiogram 01/2020: EF 55-60, no RWMA, moderate LVH, GR 1 DD, normal RV SF, RVSP 28.2, mild MR, mild aortic valve sclerosis (no aortic stenosis)  . Glaucoma   . History of gout   . Hyperlipidemia   . Hypertension   . Kidney stones    "found during prostate OR; still in there as far as I know" (05/04/2015)  . Melanoma of neck ([place])    "right; came back IV; no chemo"  . NICM (nonischemic cardiomyopathy) ([place])   . OSA on CPAP 2012   . Paroxysmal atrial fibrillation (HCC)    a. 04/2015 Tikosyn loading, DCCV x 1;  b. CHA2DS2VASc = 5-->Eliquis.  . Prostate cancer ([place])   . Type II diabetes mellitus ([place])    Past Surgical History:  Procedure Laterality Date  . ABDOMINAL HERNIA REPAIR     w/mesh  . CARDIAC CATHETERIZATION N/A 03/17/2015   Procedure: Left Heart Cath and Coronary Angiography;  Surgeon: Lorretta Harp, MD;  Location: [facility];  Service: Cardiovascular;  Laterality: N/A;  . carotid doppler  09/17/2008   rigt and left ICAs 0-49%;mildly  abnormal  . COLONOSCOPY N/A 05/16/2017   Procedure: COLONOSCOPY;  Surgeon: Rogene Houston, MD;  [facility];  Service: Endoscopy;  Laterality: N/A;  930  . DOPPLER ECHOCARDIOGRAPHY  05/25/2009   EF 50-55%,LA mildly dilated, LV function normal  . ELECTROPHYSIOLOGIC STUDY N/A 04/05/2015   Procedure: Cardioversion;  Surgeon: Sanda Klein, MD;  Location: [facility];  Service: Cardiovascular;  Laterality: N/A;  . ELECTROPHYSIOLOGIC STUDY N/A 09/06/2015   Procedure: Atrial Fibrillation Ablation;  Surgeon: Thompson Grayer, MD;  Location: [facility];  Service: Cardiovascular;  Laterality: N/A;  . ELECTROPHYSIOLOGIC STUDY N/A 07/12/2016   redo afib ablation by Dr Rayann Heman  . EXCISIONAL HEMORRHOIDECTOMY     "inside and out"  . FINE NEEDLE ASPIRATION Right    knee; "drew ~ 1 quart off"  . HERNIA REPAIR    . LAPAROSCOPIC CHOLECYSTECTOMY    . MELANOMA EXCISION Right    "neck"  . NM MYOCAR PERF WALL MOTION  02/21/2012   EF 61% ,EXERCISE 7 METS. exercise stopped due to wheezing and shortness of breathe  . POLYPECTOMY  05/16/2017   Procedure: POLYPECTOMY;  Surgeon: Rogene Houston, MD;  [facility];  Service: Endoscopy;;  colon  . PROSTATECTOMY    . SHOULDER OPEN ROTATOR CUFF REPAIR Right X 2  . TEE WITHOUT CARDIOVERSION N/A 09/05/2015   Procedure: TRANSESOPHAGEAL ECHOCARDIOGRAM (TEE);  Surgeon: Sueanne Margarita, MD;  Location: [facility];   Service: Cardiovascular;  Laterality: N/A;   FAMILY HISTORY Family History  Problem Relation Age of Onset  . Heart disease Mother   . Lung cancer Mother   . Heart attack Mother 28  . Stroke Brother   . Healthy Daughter   . Diabetes Father   . Heart disease Father    SOCIAL HISTORY Social History   Tobacco Use  . Smoking status: Former Smoker    Packs/day: 2.00    Years: 27.00    Pack years: 54.00    Types: Cigarettes    Quit date: 10/31/1983    Years since quitting: 36.7  . Smokeless tobacco: Never Used  Substance Use Topics  . Alcohol use: No    Alcohol/week: 0.0 standard drinks    Comment: "used  to drink; stopped ~ 2008"  . Drug use: No         OPHTHALMIC EXAM:  Base Eye Exam    Visual Acuity (Snellen - Linear)      Right Left   Dist cc 20/40 -2 20/30 -2   Dist ph cc 20/30 -2 NI   Correction: Glasses       Tonometry (Tonopen, 1:22 PM)      Right Left   Pressure 19 19       Pupils      Dark Light Shape React APD   Right 3 2 Round Brisk None   Left 3 2 Round Brisk None       Visual Fields (Counting fingers)      Left Right    Full Full       Extraocular Movement      Right Left    Full Full       Neuro/Psych    Oriented x3: Yes   Mood/Affect: Normal       Dilation    Both eyes: 1.0% Mydriacyl, 2.5% Phenylephrine @ 1:22 PM        Slit Lamp and Fundus Exam    Slit Lamp Exam      Right Left   Lids/Lashes Dermato, mild MGD Dermato, mild MGD   Conjunctiva/Sclera Temporal pinguecula Temporal pinguecula   Cornea EBMD, mild haze, 1-2+ PEE EBMD, mild haze, 1-2+ PEE   Anterior Chamber Deep and clear; narrow temporal angle Deep and clear   Iris Round and dilated Round and dilated   Lens 2-3+ NS, 2-3+ cortical PCIOL, trace PCO   Vitreous Synerisis Synerisis       Fundus Exam      Right Left   Disc Pink, sharp, +cupping, central pallor Pink and sharp, +cupping, +PPA   C/D Ratio 0.7 0.6   Macula Flat, blunted foveal reflex, +central cystic  changes - persistent, RPE mottling and clumping, rare MA Flat, blunted foveal reflex, clusters of fine MA nasal and superior to fovea, +cystic changes -- slightly increased from prior, small CWS superior mac -- improving   Vessels Attenuated, mild tortuoisty Attenuated, mild av crossing changes   Periphery Focal dot heme below nerve (0600), attached, rare MA Attached.  No heme.        Refraction    Wearing Rx      Sphere Cylinder Axis Add   Right -0.75 +3.50 163 +2.50   Left -0.50 +1.25 170 +2.50          IMAGING AND PROCEDURES  Imaging and Procedures for 07/05/2020  OCT, Retina - OU - Both Eyes       Right Eye Quality was borderline. Central Foveal Thickness: 304. Progression has been stable. Findings include abnormal foveal contour, no SRF, vitreomacular adhesion , intraretinal fluid (Persistent focal edema/IRF superior fovea).   Left Eye Quality was good. Central Foveal Thickness: 476. Progression has worsened. Findings include abnormal foveal contour, intraretinal fluid, intraretinal hyper-reflective material (Mild interval increase in focal IRF/edema nasal fovea; partial PVD).   Notes *Images captured and stored on drive  Diagnosis / Impression:  Mild DME OU (OS > OD) OD: Mild focal edema/IRF superior fovea OS: Mild interval increase in focal IRF/edema nasal fovea; partial PVD  Clinical management:  See below  Abbreviations: NFP - Normal foveal profile. CME - cystoid macular edema. PED - pigment epithelial detachment. IRF - intraretinal fluid. SRF - subretinal fluid. EZ - ellipsoid zone. ERM - epiretinal membrane. ORA - outer  retinal atrophy. ORT - outer retinal tubulation. SRHM - subretinal hyper-reflective material. IRHM - intraretinal hyper-reflective material       Intravitreal Injection, Pharmacologic Agent - OS - Left Eye       Time Out 07/05/2020. 1:53 PM. Confirmed correct patient, procedure, site, and patient consented.   Anesthesia Topical anesthesia  was used. Anesthetic medications included Proparacaine 0.5%, Lidocaine 2%.   Procedure Preparation included 5% betadine to ocular surface, eyelid speculum. A (32g) needle was used.   Injection:  1.25 mg Bevacizumab (AVASTIN) SOLN   NDC: 16109-604-54, Lot: [id number], Expiration date: 08/11/2020   Route: Intravitreal, Site: Left Eye, Waste: 0.05 mL  Post-op Post injection exam found visual acuity of at least counting fingers. The patient tolerated the procedure well. There were no complications. The patient received written and verbal post procedure care education. Post injection medications were not given.                 ASSESSMENT/PLAN:    ICD-10-CM   1. 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, associated with type 2 diabetes mellitus (HCC)  Y78.2956 Intravitreal Injection, Pharmacologic Agent - OS - Left Eye    Bevacizumab (AVASTIN) SOLN 1.25 mg  2. Retinal edema  H35.81 OCT, Retina - OU - Both Eyes  3. Essential hypertension  I10   4. Hypertensive retinopathy of both eyes  H35.033   5. Combined forms of age-related cataract of right eye  H25.811   6. Pseudophakia of left eye  Z96.1   7. ABMD (anterior basement membrane dystrophy)  H18.529   8. History of herpes zoster of eye  Z86.19   9. Primary open angle glaucoma of both eyes, unspecified glaucoma stage  H40.1130     1,2. 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  Single focal MA superior to fovea.                                  OS: Perifoveal Mas w/late leakage - OCT shows diabetic macular edema OU (OS>OD) - s/p IVA OS # 1(07.26.21), #2 (08.24.21) - BCVA OS improved to 20/30 from 20/40 - OCT shows interval increase in IRF/DME OS -- ?IVA resistance - recommend IVA #3 OS today, 09.21.21, but discussed possible switch in medication if edema continues to worsen on IVA - pt wishes to proceed w/ IVA OS #3 - RBA of procedure discussed,  questions answered - informed consent obtained and signed - see procedure note - [employer] benefits investigation started, 09.21.21 - f/u in 4 wks -- DFE/OCT, possible injection  3,4. Hypertensive retinopathy OU - discussed importance of tight BP control - monitor  5. Mixed Cataract OD - The symptoms of cataract, surgical options, and treatments and risks were discussed with patient. - discussed diagnosis and progression - under the expert management of Dr. Kathlen Mody  6. Pseudophakia OS  - s/p CE/IOL OS (2020, Dr. Kathlen Mody)  - IOL in good position, doing well  - monitor  7. 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 Roby Lofts at [facility], who recommended SuperK prior to [place]  8. History of herpes zoster/iritis OS  - on valtrex 1g daily -- maintenance   9. POAG OU  - under the expert management of Dr. Kathlen Mody  - IOP 19 OU today  - using latan qhs OD,  cosopt qdaily OU  (cosopt was previously OS only)   Ophthalmic Meds Ordered this visit:  Meds ordered this encounter  Medications  . Bevacizumab (AVASTIN) SOLN 1.25 mg      Return in about 4 weeks (around 08/02/2020) for DME - Dilated Exam, OCT, Possible Injxn.  There are no Patient Instructions on file for this visit.  Explained the diagnoses, plan, and follow up with the patient and they expressed understanding.  Patient expressed understanding of the importance of proper follow up care.  This document serves as a record of services personally performed by Gardiner Sleeper, MD, PhD. It was created on their behalf by Estill Bakes, COT an ophthalmic technician. The creation of this record is the provider's dictation and/or activities during the visit.    Electronically signed by: Estill Bakes, COT 9.20.21 @ 5:09 PM   This document serves as a record of services personally performed by Gardiner Sleeper, MD, PhD. It was created on their behalf by San Jetty. Owens Shark, OA an ophthalmic technician. The  creation of this record is the provider's dictation and/or activities during the visit.    Electronically signed by: San Jetty. Owens Shark, [place] 09.21.2021 5:09 PM  Gardiner Sleeper, M.D., Ph.D. Diseases & Surgery of the Retina and [place] 07/05/2020   I have reviewed the above documentation for accuracy and completeness, and I agree with the above. Gardiner Sleeper, M.D., Ph.D. 07/05/20 5:09 PM   Abbreviations: M myopia (nearsighted); A astigmatism; H hyperopia (farsighted); P presbyopia; Mrx spectacle prescription;  CTL contact lenses; OD right eye; OS left eye; OU both eyes  XT exotropia; ET esotropia; PEK punctate epithelial keratitis; PEE punctate epithelial erosions; DES dry eye syndrome; MGD meibomian gland dysfunction; ATs artificial tears; PFAT's preservative free artificial tears; [place] nuclear sclerotic cataract; PSC posterior subcapsular cataract; ERM epi-retinal membrane; PVD posterior vitreous detachment; RD retinal detachment; DM diabetes mellitus; DR diabetic retinopathy; NPDR non-proliferative diabetic retinopathy; PDR proliferative diabetic retinopathy; CSME clinically significant macular edema; DME diabetic macular edema; dbh dot blot hemorrhages; CWS cotton wool spot; POAG primary open angle glaucoma; C/D cup-to-disc ratio; HVF humphrey visual field; GVF goldmann visual field; OCT optical coherence tomography; IOP intraocular pressure; BRVO Branch retinal vein occlusion; CRVO central retinal vein occlusion; CRAO central retinal artery occlusion; BRAO branch retinal artery occlusion; RT retinal tear; SB scleral buckle; PPV pars plana vitrectomy; VH Vitreous hemorrhage; PRP panretinal laser photocoagulation; IVK intravitreal kenalog; VMT vitreomacular traction; MH Macular hole;  NVD neovascularization of the disc; NVE neovascularization elsewhere; AREDS age related eye disease study; ARMD age related macular degeneration; POAG primary open angle glaucoma; EBMD  epithelial/anterior basement membrane dystrophy; ACIOL anterior chamber intraocular lens; IOL intraocular lens; PCIOL posterior chamber intraocular lens; Phaco/IOL phacoemulsification with intraocular lens placement; [place] photorefractive keratectomy; LASIK laser assisted in situ keratomileusis; HTN hypertension; DM diabetes mellitus; COPD chronic obstructive pulmonary disease

## 2020-07-05 ENCOUNTER — Other Ambulatory Visit: Payer: Self-pay

## 2020-07-05 ENCOUNTER — Ambulatory Visit (INDEPENDENT_AMBULATORY_CARE_PROVIDER_SITE_OTHER): Payer: Medicare Other | Admitting: Ophthalmology

## 2020-07-05 ENCOUNTER — Encounter (INDEPENDENT_AMBULATORY_CARE_PROVIDER_SITE_OTHER): Payer: Self-pay | Admitting: Ophthalmology

## 2020-07-05 DIAGNOSIS — H18529 Epithelial (juvenile) corneal dystrophy, unspecified eye: Secondary | ICD-10-CM

## 2020-07-05 DIAGNOSIS — E113213 Type 2 diabetes mellitus with mild nonproliferative diabetic retinopathy with macular edema, bilateral: Secondary | ICD-10-CM | POA: Diagnosis not present

## 2020-07-05 DIAGNOSIS — H35033 Hypertensive retinopathy, bilateral: Secondary | ICD-10-CM

## 2020-07-05 DIAGNOSIS — I10 Essential (primary) hypertension: Secondary | ICD-10-CM | POA: Diagnosis not present

## 2020-07-05 DIAGNOSIS — H25811 Combined forms of age-related cataract, right eye: Secondary | ICD-10-CM

## 2020-07-05 DIAGNOSIS — Z961 Presence of intraocular lens: Secondary | ICD-10-CM

## 2020-07-05 DIAGNOSIS — H401130 Primary open-angle glaucoma, bilateral, stage unspecified: Secondary | ICD-10-CM

## 2020-07-05 DIAGNOSIS — H3581 Retinal edema: Secondary | ICD-10-CM | POA: Diagnosis not present

## 2020-07-05 DIAGNOSIS — Z8619 Personal history of other infectious and parasitic diseases: Secondary | ICD-10-CM

## 2020-07-05 MED ORDER — BEVACIZUMAB CHEMO INJECTION 1.25MG/0.05ML SYRINGE FOR KALEIDOSCOPE
1.2500 mg | INTRAVITREAL | Status: AC | PRN
  Administered 2020-07-05: 1.25 mg via INTRAVITREAL

## 2020-08-02 NOTE — Progress Notes (Signed)
[facility] Note  08/05/2020     CHIEF COMPLAINT Patient presents for Retina Follow Up   HISTORY OF PRESENT ILLNESS: Lucas Dudley is a 79 y.o. male who presents to the clinic today for:   HPI    Retina Follow Up    I, the attending physician,  performed the HPI with the patient and updated documentation appropriately.          Comments    4 week follow up NPDR OU- Fluctuating vision OD blurs then clears up.  OS doing well.  Cosopt qhs OU.   BS: 171 this am A1C: 7       Last edited by Bernarda Caffey, MD on 08/05/2020  2:09 PM. (History)    pt states states he has not seen Dr. Kathlen Mody in the last month, he thinks he has an appt with him in December  Referring physician: Hortencia Pilar, MD [place],  [place] [address]  HISTORICAL INFORMATION:   Selected notes from the MEDICAL RECORD NUMBER Referred by Dr. Kathlen Mody for DEE   CURRENT MEDICATIONS: Current Outpatient Medications (Ophthalmic Drugs)  Medication Sig  . Carboxymethylcellul-Glycerin (LUBRICATING EYE DROPS OP) Apply 1 drop to eye daily as needed (itching eyes).  . dorzolamide-timolol (COSOPT) 22.3-6.8 MG/ML ophthalmic solution Place 1 drop into both eyes 2 (two) times daily.  [name] latanoprost (XALATAN) 0.005 % ophthalmic solution Place 1 drop into both eyes at bedtime. (Patient not taking: Reported on 08/05/2020)   No current facility-administered medications for this visit. (Ophthalmic Drugs)   Current Outpatient Medications (Other)  Medication Sig  . amLODipine (NORVASC) 10 MG tablet Take 1 tablet (10 mg total) by mouth daily.  . carvedilol (COREG) 6.25 MG tablet Take 1 tablet (6.25 mg total) by mouth 2 (two) times daily.  [name] ELIQUIS 5 MG TABS tablet TAKE 1 TABLET TWICE A DAY  . furosemide (LASIX) 40 MG tablet Take 1 tablet (40 mg total) by mouth daily.  [name] gemfibrozil (LOPID) 600 MG tablet Take 600 mg by mouth at bedtime.   [name] glimepiride (AMARYL) 2 MG tablet  Take 2 mg by mouth 2 (two) times daily.   . Insulin Glargine (BASAGLAR KWIKPEN) 100 UNIT/ML SOPN Inject 40 Units into the skin at bedtime.   [name] lisinopril (ZESTRIL) 40 MG tablet TAKE 1 TABLET BY MOUTH EVERY DAY  . metFORMIN (GLUCOPHAGE-XR) 500 MG 24 hr tablet Take 1,000 mg by mouth 2 (two) times daily.  . valACYclovir (VALTREX) 1000 MG tablet Take 1,000 mg by mouth daily.    No current facility-administered medications for this visit. (Other)   REVIEW OF SYSTEMS: ROS    Positive for: Genitourinary, Endocrine, Cardiovascular, Eyes, Respiratory, Heme/Lymph   Negative for: Constitutional, Gastrointestinal, Neurological, Skin, Musculoskeletal, HENT, Psychiatric, Allergic/Imm   Last edited by Leonie Douglas, COA on 08/05/2020  1:43 PM. (History)     ALLERGIES Allergies  Allergen Reactions  . Tape Rash and Other (See Comments)    Causes skin redness, Use paper tape only.   PAST MEDICAL HISTORY Past Medical History:  Diagnosis Date  . Arthritis    "hands probably" (05/04/2015)  . CAD (coronary artery disease)    a. Cath 03/17/15 showing 100% ostial D1, 50% prox LAD to mid LAD, 40% RPDA stenosis. Med rx. // Myoview 01/2020: EF 31 diffuse perfusion defect without reversibility (suspect artifact); reviewed with Dr. Aggie Cosier study felt to be low risk  . Chronic diastolic CHF (congestive heart failure) ([place])  Echocardiogram 01/2020: EF 55-60, no RWMA, moderate LVH, GR 1 DD, normal RV SF, RVSP 28.2, mild MR, mild aortic valve sclerosis (no aortic stenosis)  . Glaucoma   . History of gout   . Hyperlipidemia   . Hypertension   . Kidney stones    "found during prostate OR; still in there as far as I know" (05/04/2015)  . Melanoma of neck ([place])    "right; came back IV; no chemo"  . NICM (nonischemic cardiomyopathy) ([place])   . OSA on CPAP 2012  . Paroxysmal atrial fibrillation (HCC)    a. 04/2015 Tikosyn loading, DCCV x 1;  b. CHA2DS2VASc = 5-->Eliquis.  . Prostate cancer ([place])   . Type II  diabetes mellitus ([place])    Past Surgical History:  Procedure Laterality Date  . ABDOMINAL HERNIA REPAIR     w/mesh  . CARDIAC CATHETERIZATION N/A 03/17/2015   Procedure: Left Heart Cath and Coronary Angiography;  Surgeon: Lorretta Harp, MD;  Location: [facility];  Service: Cardiovascular;  Laterality: N/A;  . carotid doppler  09/17/2008   rigt and left ICAs 0-49%;mildly  abnormal  . COLONOSCOPY N/A 05/16/2017   Procedure: COLONOSCOPY;  Surgeon: Rogene Houston, MD;  [facility];  Service: Endoscopy;  Laterality: N/A;  930  . DOPPLER ECHOCARDIOGRAPHY  05/25/2009   EF 50-55%,LA mildly dilated, LV function normal  . ELECTROPHYSIOLOGIC STUDY N/A 04/05/2015   Procedure: Cardioversion;  Surgeon: Sanda Klein, MD;  Location: [facility];  Service: Cardiovascular;  Laterality: N/A;  . ELECTROPHYSIOLOGIC STUDY N/A 09/06/2015   Procedure: Atrial Fibrillation Ablation;  Surgeon: Thompson Grayer, MD;  Location: [facility];  Service: Cardiovascular;  Laterality: N/A;  . ELECTROPHYSIOLOGIC STUDY N/A 07/12/2016   redo afib ablation by Dr Rayann Heman  . EXCISIONAL HEMORRHOIDECTOMY     "inside and out"  . FINE NEEDLE ASPIRATION Right    knee; "drew ~ 1 quart off"  . HERNIA REPAIR    . LAPAROSCOPIC CHOLECYSTECTOMY    . MELANOMA EXCISION Right    "neck"  . NM MYOCAR PERF WALL MOTION  02/21/2012   EF 61% ,EXERCISE 7 METS. exercise stopped due to wheezing and shortness of breathe  . POLYPECTOMY  05/16/2017   Procedure: POLYPECTOMY;  Surgeon: Rogene Houston, MD;  [facility];  Service: Endoscopy;;  colon  . PROSTATECTOMY    . SHOULDER OPEN ROTATOR CUFF REPAIR Right X 2  . TEE WITHOUT CARDIOVERSION N/A 09/05/2015   Procedure: TRANSESOPHAGEAL ECHOCARDIOGRAM (TEE);  Surgeon: Sueanne Margarita, MD;  Location[facility];  Service: Cardiovascular;  Laterality: N/A;   FAMILY HISTORY Family History  Problem Relation Age of Onset  . Heart disease Mother   . Lung  cancer Mother   . Heart attack Mother 26  . Stroke Brother   . Healthy Daughter   . Diabetes Father   . Heart disease Father    SOCIAL HISTORY Social History   Tobacco Use  . Smoking status: Former Smoker    Packs/day: 2.00    Years: 27.00    Pack years: 54.00    Types: Cigarettes    Quit date: 10/31/1983    Years since quitting: 36.7  . Smokeless tobacco: Never Used  Substance Use Topics  . Alcohol use: No    Alcohol/week: 0.0 standard drinks    Comment: "used to drink; stopped ~ 2008"  . Drug use: No         OPHTHALMIC EXAM:  Base Eye  Exam    Visual Acuity (Snellen - Linear)      Right Left   Dist cc 20/50 20/30 -1   Dist ph cc NI NI       Tonometry (Tonopen, 2:01 PM)      Right Left   Pressure 23 19       Pupils      Dark Light Shape React APD   Right 3 2 Round Brisk None   Left 3 2 Round Brisk None       Visual Fields (Counting fingers)      Left Right    Full Full       Extraocular Movement      Right Left    Full Full       Neuro/Psych    Oriented x3: Yes   Mood/Affect: Normal       Dilation    Both eyes: 1.0% Mydriacyl, 2.5% Phenylephrine @ 2:01 PM        Slit Lamp and Fundus Exam    Slit Lamp Exam      Right Left   Lids/Lashes Dermato, mild MGD Dermato, mild MGD   Conjunctiva/Sclera Temporal pinguecula Temporal pinguecula   Cornea EBMD, mild haze, 1-2+ PEE, mild Debris in tear film EBMD, mild haze, trace PEE, well healed temporal cataract wounds   Anterior Chamber Deep and clear; narrow temporal angle Deep and clear   Iris Round and dilated Round and moderately dilated to 5.21mm   Lens 3+ NS with brunesence, 2-3+ cortical PCIOL, trace PCO   Vitreous Synerisis Synerisis       Fundus Exam      Right Left   Disc Pink, sharp, +cupping, central pallor, Thin inferior rim Pink and sharp, +cupping, +PPA   C/D Ratio 0.7 0.6   Macula Flat, blunted foveal reflex, trace central cystic changes - persistent, RPE mottling and clumping, no  heme Flat, blunted foveal reflex, clusters of fine MA nasal and superior to fovea, +cystic changes -- slightly improved from prior, focal exudates superior to fovea   Vessels Attenuated, mild tortuoisty Attenuated, mild av crossing changes   Periphery Focal dot heme below nerve (0600), attached, rare MA Attached.  No heme.        Refraction    Wearing Rx      Sphere Cylinder Axis Add   Right -0.75 +3.50 163 +2.50   Left -1.00 +1.50 015 +2.50       Manifest Refraction      Sphere Cylinder Axis Dist VA   Right -0.75 +2.25 165 20/50+1   Left              IMAGING AND PROCEDURES  Imaging and Procedures for 08/05/2020  OCT, Retina - OU - Both Eyes       Right Eye Quality was borderline. Central Foveal Thickness: 303. Progression has been stable. Findings include abnormal foveal contour, no SRF, vitreomacular adhesion , intraretinal fluid (Persistent focal edema/IRF superior fovea).   Left Eye Quality was good. Central Foveal Thickness: 447. Progression has improved. Findings include abnormal foveal contour, intraretinal fluid, intraretinal hyper-reflective material (Mild interval improvement in central IRF/edema; partial PVD).   Notes *Images captured and stored on drive  Diagnosis / Impression:  Mild DME OU (OS > OD) OD: persistent focal edema/IRF superior fovea OS: Mild interval improvement in central IRF/edema; partial PVD  Clinical management:  See below  Abbreviations: NFP - Normal foveal profile. CME - cystoid macular edema. PED - pigment epithelial detachment. IRF -  intraretinal fluid. SRF - subretinal fluid. EZ - ellipsoid zone. ERM - epiretinal membrane. ORA - outer retinal atrophy. ORT - outer retinal tubulation. SRHM - subretinal hyper-reflective material. IRHM - intraretinal hyper-reflective material       Intravitreal Injection, Pharmacologic Agent - OS - Left Eye       Time Out 08/05/2020. 2:09 PM. Confirmed correct patient, procedure, site, and patient  consented.   Anesthesia Topical anesthesia was used. Anesthetic medications included Proparacaine 0.5%, Lidocaine 2%.   Procedure Preparation included 5% betadine to ocular surface, eyelid speculum. A (32g) needle was used.   Injection:  1.25 mg Bevacizumab (AVASTIN) SOLN   NDC: 06269-485-46, Lot: [id number], Expiration date: 09/30/2020   Route: Intravitreal, Site: Left Eye, Waste: 0.05 mL  Post-op Post injection exam found visual acuity of at least counting fingers. The patient tolerated the procedure well. There were no complications. The patient received written and verbal post procedure care education. Post injection medications were not given.                 ASSESSMENT/PLAN:    ICD-10-CM   1. 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 Retinal edema  H35.81 OCT, Retina - OU - Both Eyes  3. Essential hypertension  I10   4. Hypertensive retinopathy of both eyes  H35.033   5. Combined forms of age-related cataract of right eye  H25.811   6. 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 History of herpes zoster of eye  Z86.19   9. Primary open angle glaucoma of both eyes, unspecified glaucoma stage  H40.1130     1,2. 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  Single focal MA superior to fovea.                                  OS: Perifoveal Mas w/late leakage - OCT shows diabetic macular edema OU (OS>OD) - s/p IVA OS # 1(07.26.21), #2 (08.24.21), #3 (09.21.21) - BCVA OS stable at 20/30  - OCT shows mild interval improvement in IRF/DME OS -- ?IVA resistance previously - recommend IVA #4 OS today, 10.22.21, but discussed possible switch in medication if edema continues to worsen on  IVA - pt wishes to proceed  - RBA of procedure discussed, questions answered - informed consent obtained and signed - see procedure note - [email or web address] benefits investigation started, 09.21.21 -- approval still pending as of 10.22.21 - f/u in 4 wks -- DFE/OCT, possible injection  3,4. Hypertensive retinopathy OU - discussed importance of tight BP control - monitor  5. Mixed Cataract OD - The symptoms of cataract, surgical options, and treatments and risks were discussed with patient. - discussed diagnosis and progression - under the expert management of Dr. Kathlen Mody  6. Pseudophakia OS  - s/p CE/IOL OS (2020, Dr. Kathlen Mody)  - IOL in good position, doing well  - monitor  7. 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 Roby Lofts at [facility] W/S, who recommended SuperK prior to [place]  8. History of herpes zoster/iritis OS  - on valtrex 1g daily -- maintenance  9. POAG OU  - under the expert management of Dr. Kathlen Mody  - IOP 23,19 today  - cont latan qhs OD, cosopt qdaily OU  (cosopt was previously OS only)   Ophthalmic Meds Ordered this visit:  Meds ordered this encounter  Medications  . Bevacizumab (AVASTIN) SOLN 1.25 mg      Return in about 4 weeks (around 09/02/2020) for f/u NPDR OU, DFE, OCT.  There are no Patient Instructions on file for this visit.  Explained the diagnoses, plan, and follow up with the patient and they expressed understanding.  Patient expressed understanding of the importance of proper follow up care.  This document serves as a record of services personally performed by Gardiner Sleeper, MD, PhD. It was created on their behalf by San Jetty. Owens Shark, OA an ophthalmic technician. The creation of this record is the provider's dictation and/or activities during the visit.    Electronically signed by: San Jetty. [place], [place] 10.19.2021 3:08 PM  Gardiner Sleeper, M.D., Ph.D. Diseases & Surgery of the Retina and Vitreous [facility]  I have reviewed the above documentation for accuracy and completeness, and I agree with the above. Gardiner Sleeper, M.D., Ph.D. 08/05/20 3:08 PM   Abbreviations: M myopia (nearsighted); A astigmatism; H hyperopia (farsighted); P presbyopia; Mrx spectacle prescription;  CTL contact lenses; OD right eye; OS left eye; OU both eyes  XT exotropia; ET esotropia; PEK punctate epithelial keratitis; PEE punctate epithelial erosions; DES dry eye syndrome; MGD meibomian gland dysfunction; ATs artificial tears; PFAT's preservative free artificial tears; [place] nuclear sclerotic cataract; PSC posterior subcapsular cataract; ERM epi-retinal membrane; PVD posterior vitreous detachment; RD retinal detachment; DM diabetes mellitus; DR diabetic retinopathy; NPDR non-proliferative diabetic retinopathy; PDR proliferative diabetic retinopathy; CSME clinically significant macular edema; DME diabetic macular edema; dbh dot blot hemorrhages; CWS cotton wool spot; POAG primary open angle glaucoma; C/D cup-to-disc ratio; HVF humphrey visual field; GVF goldmann visual field; OCT optical coherence tomography; IOP intraocular pressure; BRVO Branch retinal vein occlusion; CRVO central retinal vein occlusion; CRAO central retinal artery occlusion; BRAO branch retinal artery occlusion; RT retinal tear; SB scleral buckle; PPV pars plana vitrectomy; VH Vitreous hemorrhage; PRP panretinal laser photocoagulation; IVK intravitreal kenalog; VMT vitreomacular traction; MH Macular hole;  NVD neovascularization of the disc; NVE neovascularization elsewhere; AREDS age related eye disease study; ARMD age related macular degeneration; POAG primary open angle glaucoma; EBMD epithelial/anterior basement membrane dystrophy; ACIOL anterior chamber intraocular lens; IOL intraocular lens; PCIOL posterior chamber intraocular lens; Phaco/IOL phacoemulsification with intraocular lens placement; [place] photorefractive keratectomy; LASIK laser  assisted in situ keratomileusis; HTN hypertension; DM diabetes mellitus; COPD chronic obstructive pulmonary disease

## 2020-08-05 ENCOUNTER — Other Ambulatory Visit: Payer: Self-pay

## 2020-08-05 ENCOUNTER — Ambulatory Visit (INDEPENDENT_AMBULATORY_CARE_PROVIDER_SITE_OTHER): Payer: Medicare Other | Admitting: Ophthalmology

## 2020-08-05 ENCOUNTER — Encounter (INDEPENDENT_AMBULATORY_CARE_PROVIDER_SITE_OTHER): Payer: Self-pay | Admitting: Ophthalmology

## 2020-08-05 DIAGNOSIS — H3581 Retinal edema: Secondary | ICD-10-CM | POA: Diagnosis not present

## 2020-08-05 DIAGNOSIS — I10 Essential (primary) hypertension: Secondary | ICD-10-CM | POA: Diagnosis not present

## 2020-08-05 DIAGNOSIS — E113213 Type 2 diabetes mellitus with mild nonproliferative diabetic retinopathy with macular edema, bilateral: Secondary | ICD-10-CM

## 2020-08-05 DIAGNOSIS — H25811 Combined forms of age-related cataract, right eye: Secondary | ICD-10-CM

## 2020-08-05 DIAGNOSIS — H35033 Hypertensive retinopathy, bilateral: Secondary | ICD-10-CM | POA: Diagnosis not present

## 2020-08-05 DIAGNOSIS — Z961 Presence of intraocular lens: Secondary | ICD-10-CM

## 2020-08-05 DIAGNOSIS — H401130 Primary open-angle glaucoma, bilateral, stage unspecified: Secondary | ICD-10-CM

## 2020-08-05 DIAGNOSIS — Z8619 Personal history of other infectious and parasitic diseases: Secondary | ICD-10-CM

## 2020-08-05 DIAGNOSIS — H18523 Epithelial (juvenile) corneal dystrophy, bilateral: Secondary | ICD-10-CM

## 2020-08-05 MED ORDER — BEVACIZUMAB CHEMO INJECTION 1.25MG/0.05ML SYRINGE FOR KALEIDOSCOPE
1.2500 mg | INTRAVITREAL | Status: AC | PRN
  Administered 2020-08-05: 1.25 mg via INTRAVITREAL

## 2020-09-01 NOTE — Progress Notes (Signed)
[facility] Note  09/05/2020     CHIEF COMPLAINT Patient presents for Retina Follow Up   HISTORY OF PRESENT ILLNESS: Lucas Dudley is a 79 y.o. male who presents to the clinic today for:   HPI    Retina Follow Up    Patient presents with  Diabetic Retinopathy.  In both eyes.  This started months ago.  Severity is moderate.  Duration of 4 weeks.  Since onset it is stable.  I, the attending physician,  performed the HPI with the patient and updated documentation appropriately.          Comments    79 y/o male pt here for 4 wk f/u for mild NPDR w/DME OU.  Feels [facility] OD is a bit blurrier due to cataract.  No change in [place] OS.  Denies pain, FOL, floaters.  BS 200 this a.m.  A1C 7.1.  Latanoprost QHS OD, Cosopt QD OU.       Last edited by Bernarda Caffey, MD on 09/05/2020  4:28 PM. (History)    pt states vision is stable, wants to move up appt with Dr. Kathlen Mody to see about cataract sx on OD  Referring physician: Hortencia Pilar, MD [place],  [place] [id number]  HISTORICAL INFORMATION:   Selected notes from the MEDICAL RECORD NUMBER Referred by Dr. Kathlen Mody for DEE   CURRENT MEDICATIONS: Current Outpatient Medications (Ophthalmic Drugs)  Medication Sig   Carboxymethylcellul-Glycerin (LUBRICATING EYE DROPS OP) Apply 1 drop to eye daily as needed (itching eyes).   dorzolamide-timolol (COSOPT) 22.3-6.8 MG/ML ophthalmic solution Place 1 drop into both eyes 2 (two) times daily.   latanoprost (XALATAN) 0.005 % ophthalmic solution Place 1 drop into both eyes at bedtime.    No current facility-administered medications for this visit. (Ophthalmic Drugs)   Current Outpatient Medications (Other)  Medication Sig   amLODipine (NORVASC) 10 MG tablet TAKE 1 TABLET BY MOUTH EVERY DAY   amLODipine (NORVASC) 5 MG tablet Take 5 mg by mouth daily.   carvedilol (COREG) 6.25 MG tablet Take 1 tablet (6.25 mg total) by mouth 2 (two) times daily.    ELIQUIS 5 MG TABS tablet TAKE 1 TABLET TWICE A DAY   furosemide (LASIX) 40 MG tablet Take 1 tablet (40 mg total) by mouth daily.   gemfibrozil (LOPID) 600 MG tablet Take 600 mg by mouth at bedtime.    glimepiride (AMARYL) 2 MG tablet Take 2 mg by mouth 2 (two) times daily.    Insulin Glargine (BASAGLAR KWIKPEN) 100 UNIT/ML SOPN Inject 40 Units into the skin at bedtime.    levofloxacin (LEVAQUIN) 500 MG tablet Take 500 mg by mouth daily.   lisinopril (ZESTRIL) 40 MG tablet TAKE 1 TABLET BY MOUTH EVERY DAY   metFORMIN (GLUCOPHAGE-XR) 500 MG 24 hr tablet Take 1,000 mg by mouth 2 (two) times daily.   valACYclovir (VALTREX) 1000 MG tablet Take 1,000 mg by mouth daily.    No current facility-administered medications for this visit. (Other)   REVIEW OF SYSTEMS: ROS    Positive for: Endocrine, Cardiovascular, Eyes, Respiratory   Negative for: Constitutional, Gastrointestinal, Neurological, Skin, Genitourinary, Musculoskeletal, HENT, Psychiatric, Allergic/Imm, Heme/Lymph   Last edited by Matthew Folks, COA on 09/05/2020  1:34 PM. (History)     ALLERGIES Allergies  Allergen Reactions   Tape Rash and Other (See Comments)    Causes skin redness, Use paper tape only.   PAST MEDICAL HISTORY Past Medical History:  Diagnosis Date   Arthritis    "hands probably" (05/04/2015)   CAD (coronary artery disease)    a. Cath 03/17/15 showing 100% ostial D1, 50% prox LAD to mid LAD, 40% RPDA stenosis. Med rx. // Myoview 01/2020: EF 31 diffuse perfusion defect without reversibility (suspect artifact); reviewed with Dr. Aggie Cosier study felt to be low risk   Cataract    Mixed form OD   Chronic diastolic CHF (congestive heart failure) ([place])    Echocardiogram 01/2020: EF 55-60, no RWMA, moderate LVH, GR 1 DD, normal RV SF, RVSP 28.2, mild MR, mild aortic valve sclerosis (no aortic stenosis)   Diabetic retinopathy (HCC)    NPDR OU   Glaucoma    POAG OU   History of gout     Hyperlipidemia    Hypertension    Hypertensive retinopathy    OU   Kidney stones    "found during prostate OR; still in there as far as I know" (05/04/2015)   Melanoma of neck ([place])    "right; came back IV; no chemo"   NICM (nonischemic cardiomyopathy) ([place])    OSA on CPAP 2012   Paroxysmal atrial fibrillation ([place])    a. 04/2015 Tikosyn loading, DCCV x 1;  b. CHA2DS2VASc = 5-->Eliquis.   Prostate cancer ([place])    Type II diabetes mellitus ([place])    Past Surgical History:  Procedure Laterality Date   ABDOMINAL HERNIA REPAIR     w/mesh   CARDIAC CATHETERIZATION N/A 03/17/2015   Procedure: Left Heart Cath and Coronary Angiography;  Surgeon: Lorretta Harp, MD;  Location: [place] [facility];  Service: Cardiovascular;  Laterality: N/A;   carotid doppler  09/17/2008   rigt and left ICAs 0-49%;mildly  abnormal   CATARACT EXTRACTION Left 2020   Dr. Kathlen Mody   COLONOSCOPY N/A 05/16/2017   Procedure: COLONOSCOPY;  Surgeon: Rogene Houston, MD;  [facility];  Service: Endoscopy;  Laterality: N/A;  [place]  05/25/2009   EF 50-55%,LA mildly dilated, LV function normal   ELECTROPHYSIOLOGIC STUDY N/A 04/05/2015   Procedure: Cardioversion;  Surgeon: Sanda Klein, MD;  Location: [place] [facility];  Service: Cardiovascular;  Laterality: N/A;   ELECTROPHYSIOLOGIC STUDY N/A 09/06/2015   Procedure: Atrial Fibrillation Ablation;  Surgeon: Thompson Grayer, MD;  Location: [place] [facility];  Service: Cardiovascular;  Laterality: N/A;   ELECTROPHYSIOLOGIC STUDY N/A 07/12/2016   redo afib ablation by Dr Rayann Heman   EXCISIONAL HEMORRHOIDECTOMY     "inside and out"   EYE SURGERY Left 2020   Cat Sx - Dr. Kathlen Mody   FINE NEEDLE ASPIRATION Right    knee; "drew ~ 1 quart off"   [place] Right    "neck"   NM MYOCAR PERF WALL MOTION  02/21/2012   EF 61% ,EXERCISE 7 METS. exercise stopped due to  wheezing and shortness of breathe   POLYPECTOMY  05/16/2017   Procedure: POLYPECTOMY;  Surgeon: Rogene Houston, MD;  [facility];  Service: Endoscopy;;  colon   PROSTATECTOMY     SHOULDER OPEN ROTATOR CUFF REPAIR Right X 2   TEE WITHOUT CARDIOVERSION N/A 09/05/2015   Procedure: TRANSESOPHAGEAL ECHOCARDIOGRAM (TEE);  Surgeon: Sueanne Margarita, MD;  Location: [facility];  Service: Cardiovascular;  Laterality: N/A;   FAMILY HISTORY Family History  Problem Relation Age of Onset   Heart disease Mother    Lung cancer Mother  Heart attack Mother 64   Stroke Brother    Healthy Daughter    Diabetes Father    Heart disease Father    SOCIAL HISTORY Social History   Tobacco Use   Smoking status: Former Smoker    Packs/day: 2.00    Years: 27.00    Pack years: 54.00    Types: Cigarettes    Quit date: 10/31/1983    Years since quitting: 36.8   Smokeless tobacco: Never Used  Substance Use Topics   Alcohol use: No    Alcohol/week: 0.0 standard drinks    Comment: "used to drink; stopped ~ 2008"   Drug use: No         OPHTHALMIC EXAM:  Base Eye Exam    Visual Acuity (Snellen - Linear)      Right Left   Dist cc 20/50 - 20/30 -   Dist ph cc NI NI   Correction: Glasses       Tonometry (Tonopen, 1:42 PM)      Right Left   Pressure 17 16       Pupils      Dark Light Shape React APD   Right 3 2 Round Brisk None   Left 3 2 Round Brisk None       Visual Fields (Counting fingers)      Left Right    Full Full       Extraocular Movement      Right Left    Full, Ortho Full, Ortho       Neuro/Psych    Oriented x3: Yes   Mood/Affect: Normal       Dilation    Both eyes: 1.0% Mydriacyl, 2.5% Phenylephrine @ 1:42 PM        Slit Lamp and Fundus Exam    Slit Lamp Exam      Right Left   Lids/Lashes Dermato, mild MGD Dermato, mild MGD   Conjunctiva/Sclera Temporal pinguecula Temporal pinguecula   Cornea EBMD, mild haze, 1-2+ PEE, mild Debris  in tear film EBMD, mild haze, trace PEE, well healed temporal cataract wounds, arcus   Anterior Chamber Deep and clear; narrow temporal angle Deep and clear   Iris Round and dilated, mild anterior bowing, No NVI Round and moderately dilated to 5.10mm   Lens 3+ NS with brunesence, 2-3+ cortical PCIOL, trace PCO - non central   Vitreous Synerisis Synerisis       Fundus Exam      Right Left   Disc Pink, sharp, +cupping, central pallor, Thin inferior rim Pink and sharp, +cupping, +PPA   C/D Ratio 0.7 0.6   Macula Flat, blunted foveal reflex, trace central cystic changes - persistent, RPE mottling and clumping, no heme Flat, blunted foveal reflex, clusters of fine MA nasal and superior to fovea, +cystic changes -- slightly improved from prior, focal exudates superior to fovea - improving    Vessels Attenuated, mild tortuoisty Attenuated, mild av crossing changes   Periphery Attached, Focal dot heme below nerve (0600) - improved, rare MA Attached.  No heme.          IMAGING AND PROCEDURES  Imaging and Procedures for 09/05/2020  OCT, Retina - OU - Both Eyes       Right Eye Quality was borderline. Central Foveal Thickness: 308. Progression has been stable. Findings include abnormal foveal contour, no SRF, vitreomacular adhesion , intraretinal fluid (Persistent focal edema/IRF superior fovea).   Left Eye Quality was good. Central Foveal Thickness: 436. Progression has improved.  Findings include abnormal foveal contour, intraretinal fluid, intraretinal hyper-reflective material (Mild interval improvement in IRF; partial PVD).   Notes *Images captured and stored on drive  Diagnosis / Impression:  Mild DME OU (OS > OD) OD: persistent focal edema/IRF superior fovea OS: Mild interval improvement in IRF; partial PVD  Clinical management:  See below  Abbreviations: NFP - Normal foveal profile. CME - cystoid macular edema. PED - pigment epithelial detachment. IRF - intraretinal fluid. SRF -  subretinal fluid. EZ - ellipsoid zone. ERM - epiretinal membrane. ORA - outer retinal atrophy. ORT - outer retinal tubulation. SRHM - subretinal hyper-reflective material. IRHM - intraretinal hyper-reflective material       Intravitreal Injection, Pharmacologic Agent - OS - Left Eye       Time Out 09/05/2020. 2:12 PM. Confirmed correct patient, procedure, site, and patient consented.   Anesthesia Topical anesthesia was used. Anesthetic medications included Proparacaine 0.5%, Lidocaine 2%.   Procedure Preparation included 5% betadine to ocular surface, eyelid speculum. A (32g) needle was used.   Injection:  2 mg aflibercept Alfonse Flavors) SOLN   NDC: A3590391, Lot: [id number], Expiration date: 11/15/2020   Route: Intravitreal, Site: Left Eye, Waste: 0.05 mL  Post-op Post injection exam found visual acuity of at least counting fingers. The patient tolerated the procedure well. There were no complications. The patient received written and verbal post procedure care education. Post injection medications were not given.                 ASSESSMENT/PLAN:    ICD-10-CM   1. 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, associated with type 2 diabetes mellitus (HCC)  W26.3785 Intravitreal Injection, Pharmacologic Agent - OS - Left Eye    aflibercept (EYLEA) SOLN 2 mg  2. Retinal edema  H35.81 OCT, Retina - OU - Both Eyes  3. Essential hypertension  I10   4. Hypertensive retinopathy of both eyes  H35.033   5. Combined forms of age-related cataract of right eye  H25.811   6. 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 History of herpes zoster of eye  Z86.19   9. Primary open angle glaucoma of both eyes, unspecified glaucoma stage  H40.1130     1,2. 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  Single focal MA superior to fovea.                                   OS: Perifoveal Mas w/late leakage - OCT shows diabetic macular edema OU (OS>OD) - s/p IVA OS # 1(07.26.21), #2 (08.24.21), #3 (09.21.21), #4 (10.22.21) -- IVA resistance - BCVA OS stable at 20/30  - OCT shows mild interval improvement in IRF OS -- IVA resistance - discussed possible switch in medication - recommend IVE #1 OS today, 11.22.21 - pt wishes to proceed with IVE OS #1 - RBA of procedure discussed, questions answered - IVA informed consent obtained and signed, 07.26.21 (OS) - IVE informed consent obtained and signed, 11.22.21 (OS) - see procedure note - Eylea4U benefits investigation started, 09.21.21 -- approved as of 11.22.21 - f/u in 4 wks -- DFE/OCT, possible injection  3,4. Hypertensive retinopathy OU - discussed importance of tight BP control - monitor  5. Mixed Cataract OD - The symptoms of cataract, surgical options, and treatments and risks were  discussed with patient. - discussed diagnosis and progression - under the expert management of Dr. Kathlen Mody  6. Pseudophakia OS  - s/p CE/IOL OS (2020, Dr. Kathlen Mody)  - IOL in good position, doing well  - monitor  7. 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 Roby Lofts at [facility], who recommended SuperK prior to [place]  8. History of herpes zoster/iritis OS  - on valtrex 1g daily -- maintenance   9. POAG OU  - under the expert management of Dr. Kathlen Mody  - IOP 17, 16 today  - cont latan qhs OD, cosopt qdaily OU  (cosopt was previously OS only)   Ophthalmic Meds Ordered this visit:  Meds ordered this encounter  Medications   aflibercept (EYLEA) SOLN 2 mg      Return in about 4 weeks (around 10/03/2020) for f/u NPDR OU, DFE, OCT.  There are no Patient Instructions on file for this visit.  This document serves as a record of services personally performed by Gardiner Sleeper, MD, PhD. It was created on their behalf by Leeann Must, [place], an ophthalmic technician. The  creation of this record is the provider's dictation and/or activities during the visit.    Electronically signed by: Leeann Must, [place] 11.18.2021 4:33 PM   This document serves as a record of services personally performed by Gardiner Sleeper, MD, PhD. It was created on their behalf by San Jetty. Owens Shark, OA an ophthalmic technician. The creation of this record is the provider's dictation and/or activities during the visit.    Electronically signed by: San Jetty. Marguerita Merles 11.22.2021 4:33 PM  Gardiner Sleeper, M.D., Ph.D. Diseases & Surgery of the Retina and [place] 09/05/2020   I have reviewed the above documentation for accuracy and completeness, and I agree with the above. Gardiner Sleeper, M.D., Ph.D. 09/05/20 4:33 PM   Abbreviations: M myopia (nearsighted); A astigmatism; H hyperopia (farsighted); P presbyopia; Mrx spectacle prescription;  CTL contact lenses; OD right eye; OS left eye; OU both eyes  XT exotropia; ET esotropia; PEK punctate epithelial keratitis; PEE punctate epithelial erosions; DES dry eye syndrome; MGD meibomian gland dysfunction; ATs artificial tears; PFAT's preservative free artificial tears; [place] nuclear sclerotic cataract; PSC posterior subcapsular cataract; ERM epi-retinal membrane; PVD posterior vitreous detachment; RD retinal detachment; DM diabetes mellitus; DR diabetic retinopathy; NPDR non-proliferative diabetic retinopathy; PDR proliferative diabetic retinopathy; CSME clinically significant macular edema; DME diabetic macular edema; dbh dot blot hemorrhages; CWS cotton wool spot; POAG primary open angle glaucoma; C/D cup-to-disc ratio; HVF humphrey visual field; GVF goldmann visual field; OCT optical coherence tomography; IOP intraocular pressure; BRVO Branch retinal vein occlusion; CRVO central retinal vein occlusion; CRAO central retinal artery occlusion; BRAO branch retinal artery occlusion; RT retinal tear; SB scleral buckle; PPV  pars plana vitrectomy; VH Vitreous hemorrhage; PRP panretinal laser photocoagulation; IVK intravitreal kenalog; VMT vitreomacular traction; MH Macular hole;  NVD neovascularization of the disc; NVE neovascularization elsewhere; AREDS age related eye disease study; ARMD age related macular degeneration; POAG primary open angle glaucoma; EBMD epithelial/anterior basement membrane dystrophy; ACIOL anterior chamber intraocular lens; IOL intraocular lens; PCIOL posterior chamber intraocular lens; Phaco/IOL phacoemulsification with intraocular lens placement; [place] photorefractive keratectomy; LASIK laser assisted in situ keratomileusis; HTN hypertension; DM diabetes mellitus; COPD chronic obstructive pulmonary disease

## 2020-09-02 ENCOUNTER — Other Ambulatory Visit: Payer: Self-pay | Admitting: Physician Assistant

## 2020-09-05 ENCOUNTER — Ambulatory Visit (INDEPENDENT_AMBULATORY_CARE_PROVIDER_SITE_OTHER): Payer: Medicare Other | Admitting: Ophthalmology

## 2020-09-05 ENCOUNTER — Encounter (INDEPENDENT_AMBULATORY_CARE_PROVIDER_SITE_OTHER): Payer: Self-pay | Admitting: Ophthalmology

## 2020-09-05 ENCOUNTER — Other Ambulatory Visit: Payer: Self-pay

## 2020-09-05 DIAGNOSIS — H25811 Combined forms of age-related cataract, right eye: Secondary | ICD-10-CM

## 2020-09-05 DIAGNOSIS — H3581 Retinal edema: Secondary | ICD-10-CM

## 2020-09-05 DIAGNOSIS — Z8619 Personal history of other infectious and parasitic diseases: Secondary | ICD-10-CM

## 2020-09-05 DIAGNOSIS — E113213 Type 2 diabetes mellitus with mild nonproliferative diabetic retinopathy with macular edema, bilateral: Secondary | ICD-10-CM

## 2020-09-05 DIAGNOSIS — H35033 Hypertensive retinopathy, bilateral: Secondary | ICD-10-CM

## 2020-09-05 DIAGNOSIS — H18523 Epithelial (juvenile) corneal dystrophy, bilateral: Secondary | ICD-10-CM

## 2020-09-05 DIAGNOSIS — I10 Essential (primary) hypertension: Secondary | ICD-10-CM

## 2020-09-05 DIAGNOSIS — H401130 Primary open-angle glaucoma, bilateral, stage unspecified: Secondary | ICD-10-CM

## 2020-09-05 DIAGNOSIS — Z961 Presence of intraocular lens: Secondary | ICD-10-CM

## 2020-09-05 MED ORDER — AFLIBERCEPT 2MG/0.05ML IZ SOLN FOR KALEIDOSCOPE
2.0000 mg | INTRAVITREAL | Status: AC | PRN
  Administered 2020-09-05: 2 mg via INTRAVITREAL

## 2020-09-21 ENCOUNTER — Ambulatory Visit: Payer: Medicare Other | Admitting: Cardiology

## 2020-09-21 ENCOUNTER — Other Ambulatory Visit: Payer: Self-pay

## 2020-09-21 ENCOUNTER — Encounter: Payer: Self-pay | Admitting: Cardiology

## 2020-09-21 VITALS — BP 152/70 | HR 94 | Ht 70.0 in | Wt 245.0 lb

## 2020-09-21 DIAGNOSIS — I2510 Atherosclerotic heart disease of native coronary artery without angina pectoris: Secondary | ICD-10-CM

## 2020-09-21 DIAGNOSIS — E785 Hyperlipidemia, unspecified: Secondary | ICD-10-CM

## 2020-09-21 DIAGNOSIS — I480 Paroxysmal atrial fibrillation: Secondary | ICD-10-CM | POA: Diagnosis not present

## 2020-09-21 DIAGNOSIS — I10 Essential (primary) hypertension: Secondary | ICD-10-CM

## 2020-09-21 DIAGNOSIS — I5033 Acute on chronic diastolic (congestive) heart failure: Secondary | ICD-10-CM

## 2020-09-21 NOTE — Patient Instructions (Signed)
Medication Instructions:  ° °Your physician recommends that you continue on your current medications as directed. Please refer to the Current Medication list given to you today. ° °*If you need a refill on your cardiac medications before your next appointment, please call your pharmacy* ° ° °Follow-Up: °At [facility], you and your health needs are our priority.  As part of our continuing mission to provide you with exceptional heart care, we have created designated Provider Care Teams.  These Care Teams include your primary Cardiologist (physician) and Advanced Practice Providers (APPs -  Physician Assistants and Nurse Practitioners) who all work together to provide you with the care you need, when you need it. ° °We recommend signing up for the patient portal called "MyChart".  Sign up information is provided on this After Visit Summary.  MyChart is used to connect with patients for Virtual Visits (Telemedicine).  Patients are able to view lab/test results, encounter notes, upcoming appointments, etc.  Non-urgent messages can be sent to your provider as well.   °To learn more about what you can do with MyChart, go to [email or web address].   ° °Your next appointment:   °6 month(s) ° °The format for your next appointment:   °In Person ° °Provider:   °Katarina Nelson, MD ° ° ° ° °

## 2020-09-21 NOTE — Progress Notes (Signed)
Cardiology Office Note:    Date:  09/21/2020   ID:  Lucas Dudley, DOB Dec 03, 1940, MRN [id number]  PCP:  [name], MD  Cardiologist:  Ena Dawley, MD   Electrophysiologist:  Thompson Grayer, MD   Referring MD: [name], MD   Chief Complaint: Chest pain  Patient Profile:    Lucas Dudley is a 79 y.o. male with:   Paroxysmal atrial fibrillation  ? S/p PVI ablation 08/2015, 06/2016 (Dr. Rayann Heman)  Coronary artery disease  ? D1 occluded, mod dz in LAD, RPDA tx med  HFrEF with return of normal LVF ? Nonischemic cardiomyopathy (tachycardia induced) ? Echo 4/16: EF 40 ? Echo 10/2017: EF 60-65  Diabetes mellitus   Hypertension   Hyperlipidemia   OSA on CPAP  Prior CV studies: Echocardiogram 02/02/2020 EF 55-60, no RWMA, mod conc LVH, Gr 1 DD, normal RVSF, RVSP 28.2, mild MR  Myoview 02/02/2020 EF 31 (normal on echocardiogram); HR blunted, artifact, no ischemia (reviewed with Dr. Meda Coffee - low risk)  Echocardiogram 10/21/2017 Severe LVH, EF 60-65, normal wall motion, grade 2 diastolic dysfunction  Cardiac catheterization 03/17/2015 LAD proximal 50; D1 ostial 100 RPDA 40 EF 25-35  History of Present Illness:    Lucas Dudley is a 79 year old male with prior medical history of CAD, prior MI, chronic combined systolic diastolic CHF, most recent LVEF 55-60% improved from 25 to 35% in the past.  The patient is coming for regular follow-up, he states that early in the mornings he feels left-sided sharp chest pain that resolves with walking around.  He denies any palpitations dizziness or syncope.  His lower extremity edema has improved with Lasix 40 mg daily and he has no orthopnea or proximal nocturnal dyspnea.  No episodes of orthostatic hypotension, no falls.  He has been compliant with his meds and tolerates them well.  Past Medical History:  Diagnosis Date  . Arthritis    "hands probably" (05/04/2015)  . CAD (coronary artery disease)    a. Cath 03/17/15 showing 100%  ostial D1, 50% prox LAD to mid LAD, 40% RPDA stenosis. Med rx. // Myoview 01/2020: EF 31 diffuse perfusion defect without reversibility (suspect artifact); reviewed with Dr. Aggie Cosier study felt to be low risk  . Cataract    Mixed form OD  . Chronic diastolic CHF (congestive heart failure) (HCC)    Echocardiogram 01/2020: EF 55-60, no RWMA, moderate LVH, GR 1 DD, normal RV SF, RVSP 28.2, mild MR, mild aortic valve sclerosis (no aortic stenosis)  . Diabetic retinopathy ([place])    NPDR OU  . Glaucoma    POAG OU  . History of gout   . Hyperlipidemia   . Hypertension   . Hypertensive retinopathy    OU  . Kidney stones    "found during prostate OR; still in there as far as I know" (05/04/2015)  . Melanoma of neck ([place])    "right; came back IV; no chemo"  . NICM (nonischemic cardiomyopathy) ([place])   . OSA on CPAP 2012  . Paroxysmal atrial fibrillation (HCC)    a. 04/2015 Tikosyn loading, DCCV x 1;  b. CHA2DS2VASc = 5-->Eliquis.  . Prostate cancer ([place])   . Type II diabetes mellitus (HCC)     Current Medications: Current Meds  Medication Sig  . amLODipine (NORVASC) 10 MG tablet TAKE 1 TABLET BY MOUTH EVERY DAY  . amLODipine (NORVASC) 5 MG tablet Take 5 mg by mouth daily.  . Carboxymethylcellul-Glycerin (LUBRICATING EYE DROPS OP) Apply 1 drop to eye  daily as needed (itching eyes).  . carvedilol (COREG) 6.25 MG tablet Takes 1/2 tablet by mouth in the AM and 1 tablet by mouth in the PM  . dorzolamide-timolol (COSOPT) 22.3-6.8 MG/ML ophthalmic solution Place 1 drop into both eyes 2 (two) times daily.  [name] ELDERBERRY PO Take by mouth daily.  [name] ELIQUIS 5 MG TABS tablet TAKE 1 TABLET TWICE A DAY  . furosemide (LASIX) 40 MG tablet Take 1 tablet (40 mg total) by mouth daily.  [name] gemfibrozil (LOPID) 600 MG tablet Take 600 mg by mouth at bedtime.   [name] glimepiride (AMARYL) 2 MG tablet Take 2 mg by mouth 2 (two) times daily.   . Ibuprofen-diphenhydrAMINE Cit (ADVIL PM PO) Take by mouth at bedtime.  .  Insulin Glargine (BASAGLAR KWIKPEN) 100 UNIT/ML SOPN Inject 40 Units into the skin at bedtime.   [name] latanoprost (XALATAN) 0.005 % ophthalmic solution Place 1 drop into both eyes at bedtime.   [name] levofloxacin (LEVAQUIN) 500 MG tablet Take 500 mg by mouth daily.  [name] lisinopril (ZESTRIL) 40 MG tablet TAKE 1 TABLET BY MOUTH EVERY DAY  . MAGNESIUM PO Take by mouth.  . MELATONIN PO Take by mouth.  . metFORMIN (GLUCOPHAGE-XR) 500 MG 24 hr tablet Take 1,000 mg by mouth 2 (two) times daily.  . Omega-3 Fatty Acids (FISH OIL PO) Take by mouth daily.  . valACYclovir (VALTREX) 1000 MG tablet Take 1,000 mg by mouth daily.     Allergies:   Tape   Social History   Tobacco Use  . Smoking status: Former Smoker    Packs/day: 2.00    Years: 27.00    Pack years: 54.00    Types: Cigarettes    Quit date: 10/31/1983    Years since quitting: 36.9  . Smokeless tobacco: Never Used  Substance Use Topics  . Alcohol use: No    Alcohol/week: 0.0 standard drinks    Comment: "used to drink; stopped ~ 2008"  . Drug use: No    Family Hx: The patient's family history includes Diabetes in his father; Healthy in his daughter; Heart attack (age of onset: 72) in his mother; Heart disease in his father and mother; Lung cancer in his mother; Stroke in his brother.  ROS   EKGs/Labs/Other Test Reviewed:    EKG:  EKG is   ordered today.  The ekg ordered today demonstrates sinus bradycardia, HR 53, left axis deviation, first-degree AV block, PR interval 242, QTC 409, no change from prior tracing  Recent Labs: 01/12/2020: Hemoglobin 14.7; Platelets 220 01/21/2020: BUN 22; Creatinine, Ser 0.95; NT-Pro BNP 777; Potassium 4.3; Sodium 138   Recent Lipid Panel No results found for: CHOL, TRIG, HDL, CHOLHDL, LDLCALC, LDLDIRECT  Physical Exam:    VS:  BP (!) 152/70   Pulse 94   Ht 5\' 10"  (1.778 m)   Wt 245 lb (111.1 kg)   SpO2 96%   BMI 35.15 kg/m     Wt Readings from Last 3 Encounters:  09/21/20 245 lb (111.1 kg)   05/17/20 240 lb (108.9 kg)  05/02/20 244 lb (110.7 kg)    Constitutional:      Appearance: Healthy appearance. Not in distress.  Neck:     Thyroid: No thyromegaly.     Vascular: JVD normal.  Pulmonary:     Effort: Pulmonary effort is normal.     Breath sounds: No wheezing. No rales.  Cardiovascular:     Normal rate. Regular rhythm. Normal S1. Normal S2.  Murmurs: There is no murmur.  Edema:    Pretibial: bilateral trace edema of the pretibial area.    Ankle: bilateral 1+ edema of the ankle. Abdominal:     Palpations: Abdomen is soft. There is no hepatomegaly.  Skin:    General: Skin is warm and dry.  Neurological:     General: No focal deficit present.     Mental Status: Alert and oriented to person, place and time.     Cranial Nerves: Cranial nerves are intact.     ASSESSMENT & PLAN:    1. Acute on Chronic diastolic CHF (congestive heart failure) ([place]), previously low LVEF 25-30%, most recent 55-60% He seems to be euvolemic on Lasix 40 mg daily.  He is advised to start walking daily starting with 10 minutes a day with 5-minute increments to build up to 30 minutes/day.  2. Coronary artery disease involving native coronary artery of native heart without angina pectoris He has very atypical chest pain, his EKG is unchanged from prior.  3. Paroxysmal atrial fibrillation (HCC) Maintaining sinus rhythm.  He is tolerating anticoagulation with Apixaban.  No bleeding.  4. Essential hypertension Repeat blood pressure 130/70 mmHg.  New same management.  Dispo: Follow-up in 6 months.  Medication Adjustments/Labs and Tests Ordered: Current medicines are reviewed at length with the patient today.  Concerns regarding medicines are outlined above.  Tests Ordered: No orders of the defined types were placed in this encounter.  Medication Changes: No orders of the defined types were placed in this encounter.  Signed, Ena Dawley, MD  09/21/2020 9:50 AM    [address], [place], [place]  [address] Phone: [phone]; Fax: [phone]

## 2020-09-30 NOTE — Progress Notes (Signed)
[facility] Note  10/04/2020     CHIEF COMPLAINT Patient presents for Retina Follow Up   HISTORY OF PRESENT ILLNESS: Lucas Dudley is a 79 y.o. male who presents to the clinic today for:   HPI    Retina Follow Up    Patient presents with  Diabetic Retinopathy.  In both eyes.  This started months ago.  Severity is moderate.  Duration of 4 weeks.  Since onset it is stable.  I, the attending physician,  performed the HPI with the patient and updated documentation appropriately.          Comments    79 y/o male pt here for 4 wk f/u for NPDR w/DME OU.  No change in [place] OU.  Denies pain, FOL, floaters.  Latanoprost BID OD.  Cosopt BID OS.  BS unknown.  A1C 6.8 5 mos ago.       Last edited by Bernarda Caffey, MD on 10/04/2020  9:42 PM. (History)      Referring physician: [name], MD [place],  [place] [address]  HISTORICAL INFORMATION:   Selected notes from the MEDICAL RECORD NUMBER Referred by Dr. Kathlen Mody for DEE   CURRENT MEDICATIONS: Current Outpatient Medications (Ophthalmic Drugs)  Medication Sig  . Carboxymethylcellul-Glycerin (LUBRICATING EYE DROPS OP) Apply 1 drop to eye daily as needed (itching eyes).  . dorzolamide-timolol (COSOPT) 22.3-6.8 MG/ML ophthalmic solution Place 1 drop into both eyes 2 (two) times daily.  [name] latanoprost (XALATAN) 0.005 % ophthalmic solution Place 1 drop into both eyes at bedtime.    No current facility-administered medications for this visit. (Ophthalmic Drugs)   Current Outpatient Medications (Other)  Medication Sig  . amLODipine (NORVASC) 10 MG tablet TAKE 1 TABLET BY MOUTH EVERY DAY  . amLODipine (NORVASC) 5 MG tablet Take 5 mg by mouth daily.  . carvedilol (COREG) 6.25 MG tablet Takes 1/2 tablet by mouth in the AM and 1 tablet by mouth in the PM  . ELDERBERRY PO Take by mouth daily.  [name] ELIQUIS 5 MG TABS tablet TAKE 1 TABLET TWICE A DAY  . furosemide (LASIX) 40 MG tablet Take 1 tablet (40  mg total) by mouth daily.  [name] gemfibrozil (LOPID) 600 MG tablet Take 600 mg by mouth at bedtime.   [name] glimepiride (AMARYL) 2 MG tablet Take 2 mg by mouth 2 (two) times daily.   . Ibuprofen-diphenhydrAMINE Cit (ADVIL PM PO) Take by mouth at bedtime.  . Insulin Glargine (BASAGLAR KWIKPEN) 100 UNIT/ML SOPN Inject 40 Units into the skin at bedtime.   [name] levofloxacin (LEVAQUIN) 500 MG tablet Take 500 mg by mouth daily.  [name] lisinopril (ZESTRIL) 40 MG tablet TAKE 1 TABLET BY MOUTH EVERY DAY  . MAGNESIUM PO Take by mouth.  . MELATONIN PO Take by mouth.  . metFORMIN (GLUCOPHAGE-XR) 500 MG 24 hr tablet Take 1,000 mg by mouth 2 (two) times daily.  . Omega-3 Fatty Acids (FISH OIL PO) Take by mouth daily.  . valACYclovir (VALTREX) 1000 MG tablet Take 1,000 mg by mouth daily.    No current facility-administered medications for this visit. (Other)   REVIEW OF SYSTEMS: ROS    Positive for: Endocrine, Cardiovascular, Eyes   Negative for: Constitutional, Gastrointestinal, Neurological, Skin, Genitourinary, Musculoskeletal, HENT, Respiratory, Psychiatric, Allergic/Imm, Heme/Lymph   Last edited by Matthew Folks, COA on 10/04/2020  1:54 PM. (History)     ALLERGIES Allergies  Allergen Reactions  . Tape Rash and Other (See Comments)  Causes skin redness, Use paper tape only.   PAST MEDICAL HISTORY Past Medical History:  Diagnosis Date  . Arthritis    "hands probably" (05/04/2015)  . CAD (coronary artery disease)    a. Cath 03/17/15 showing 100% ostial D1, 50% prox LAD to mid LAD, 40% RPDA stenosis. Med rx. // Myoview 01/2020: EF 31 diffuse perfusion defect without reversibility (suspect artifact); reviewed with Dr. Aggie Cosier study felt to be low risk  . Cataract    Mixed form OD  . Chronic diastolic CHF (congestive heart failure) (HCC)    Echocardiogram 01/2020: EF 55-60, no RWMA, moderate LVH, GR 1 DD, normal RV SF, RVSP 28.2, mild MR, mild aortic valve sclerosis (no aortic stenosis)  . 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 Glaucoma    POAG OU  . History of gout   . Hyperlipidemia   . Hypertension   . Hypertensive retinopathy    OU  . Kidney stones    "found during prostate OR; still in there as far as I know" (05/04/2015)  . Melanoma of neck ([place])    "right; came back IV; no chemo"  . NICM (nonischemic cardiomyopathy) ([place])   . OSA on CPAP 2012  . Paroxysmal atrial fibrillation (HCC)    a. 04/2015 Tikosyn loading, DCCV x 1;  b. CHA2DS2VASc = 5-->Eliquis.  . Prostate cancer ([place])   . Type II diabetes mellitus ([place])    Past Surgical History:  Procedure Laterality Date  . ABDOMINAL HERNIA REPAIR     w/mesh  . CARDIAC CATHETERIZATION N/A 03/17/2015   Procedure: Left Heart Cath and Coronary Angiography;  Surgeon: Lorretta Harp, MD;  Location: [facility];  Service: Cardiovascular;  Laterality: N/A;  . carotid doppler  09/17/2008   rigt and left ICAs 0-49%;mildly  abnormal  . CATARACT EXTRACTION Left 2020   Dr. Kathlen Mody  . COLONOSCOPY N/A 05/16/2017   Procedure: COLONOSCOPY;  Surgeon: Rogene Houston, MD;  [facility];  Service: Endoscopy;  Laterality: N/A;  930  . DOPPLER ECHOCARDIOGRAPHY  05/25/2009   EF 50-55%,LA mildly dilated, LV function normal  . ELECTROPHYSIOLOGIC STUDY N/A 04/05/2015   Procedure: Cardioversion;  Surgeon: Sanda Klein, MD;  Location: [facility];  Service: Cardiovascular;  Laterality: N/A;  . ELECTROPHYSIOLOGIC STUDY N/A 09/06/2015   Procedure: Atrial Fibrillation Ablation;  Surgeon: Thompson Grayer, MD;  Location: [facility];  Service: Cardiovascular;  Laterality: N/A;  . ELECTROPHYSIOLOGIC STUDY N/A 07/12/2016   redo afib ablation by Dr Rayann Heman  . EXCISIONAL HEMORRHOIDECTOMY     "inside and out"  . EYE SURGERY Left 2020   Cat Sx - Dr. Kathlen Mody  . FINE NEEDLE ASPIRATION Right    knee; "drew ~ 1 quart off"  . HERNIA REPAIR    . LAPAROSCOPIC CHOLECYSTECTOMY    . MELANOMA EXCISION Right    "neck"  . NM MYOCAR PERF  WALL MOTION  02/21/2012   EF 61% ,EXERCISE 7 METS. exercise stopped due to wheezing and shortness of breathe  . POLYPECTOMY  05/16/2017   Procedure: POLYPECTOMY;  Surgeon: Rogene Houston, MD;  [facility];  Service: Endoscopy;;  colon  . PROSTATECTOMY    . SHOULDER OPEN ROTATOR CUFF REPAIR Right X 2  . TEE WITHOUT CARDIOVERSION N/A 09/05/2015   Procedure: TRANSESOPHAGEAL ECHOCARDIOGRAM (TEE);  Surgeon: Sueanne Margarita, MD;  Location: [facility];  Service: Cardiovascular;  Laterality: N/A;   FAMILY HISTORY Family History  Problem Relation  Age of Onset  . Heart disease Mother   . Lung cancer Mother   . Heart attack Mother 72  . Stroke Brother   . Healthy Daughter   . Diabetes Father   . Heart disease Father    SOCIAL HISTORY Social History   Tobacco Use  . Smoking status: Former Smoker    Packs/day: 2.00    Years: 27.00    Pack years: 54.00    Types: Cigarettes    Quit date: 10/31/1983    Years since quitting: 36.9  . Smokeless tobacco: Never Used  Substance Use Topics  . Alcohol use: No    Alcohol/week: 0.0 standard drinks    Comment: "used to drink; stopped ~ 2008"  . Drug use: No         OPHTHALMIC EXAM:  Base Eye Exam    Visual Acuity (Snellen - Linear)      Right Left   Dist cc 20/50 - 20/40 -   Dist ph cc NI 20/30 -2   Correction: Glasses       Tonometry (Tonopen, 1:57 PM)      Right Left   Pressure 19 16       Pupils      Dark Light Shape React APD   Right 3 2 Round Brisk None   Left 3 2 Round Brisk None       Visual Fields (Counting fingers)      Left Right    Full Full       Extraocular Movement      Right Left    Full, Ortho Full, Ortho       Neuro/Psych    Oriented x3: Yes   Mood/Affect: Normal       Dilation    Both eyes: 1.0% Mydriacyl, 2.5% Phenylephrine @ 1:57 PM        Slit Lamp and Fundus Exam    Slit Lamp Exam      Right Left   Lids/Lashes Dermato, mild MGD Dermato, mild MGD   Conjunctiva/Sclera  Temporal pinguecula Temporal pinguecula   Cornea EBMD, mild haze, 1-2+ PEE, mild Debris in tear film EBMD, mild haze, trace PEE, well healed temporal cataract wounds, arcus   Anterior Chamber Deep and clear; narrow temporal angle Deep and clear   Iris Round and dilated, mild anterior bowing, No NVI Round and moderately dilated to 5.69mm   Lens 3+ NS with brunesence, 2-3+ cortical PCIOL, trace PCO - non central   Vitreous Synerisis Synerisis       Fundus Exam      Right Left   Disc Pink, sharp, +cupping, central pallor, Thin inferior rim Pink and sharp, +cupping, +PPA   C/D Ratio 0.7 0.6   Macula Flat, blunted foveal reflex, trace central cystic changes - persistent, RPE mottling and clumping, no heme Flat, blunted foveal reflex, clusters of fine MA nasal and superior to fovea, +cystic changes -- persistent, focal exudates superior to fovea - improving    Vessels Attenuated, mild tortuoisty Attenuated, mild av crossing changes, mild tortuousity   Periphery Attached, Focal dot heme below nerve (0600) - improved, rare MA Attached.  No heme.          IMAGING AND PROCEDURES  Imaging and Procedures for 10/04/2020  OCT, Retina - OU - Both Eyes       Right Eye Quality was borderline. Central Foveal Thickness: 308. Progression has been stable. Findings include abnormal foveal contour, no SRF, vitreomacular adhesion , intraretinal fluid (Persistent focal  edema/IRF superior fovea -- slightly improved).   Left Eye Quality was good. Central Foveal Thickness: 449. Progression has been stable. Findings include abnormal foveal contour, intraretinal fluid, intraretinal hyper-reflective material (persistent IRF; partial PVD).   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  CME - cystoid macular edema. PED - pigment epithelial detachment. IRF - intraretinal fluid. SRF - subretinal fluid. EZ - ellipsoid zone. ERM - epiretinal membrane. ORA - outer retinal atrophy. ORT - outer retinal tubulation. SRHM - subretinal hyper-reflective material. IRHM - intraretinal hyper-reflective material       Intravitreal Injection, Pharmacologic Agent - OS - Left Eye       Time Out 10/04/2020. 2:38 PM. Confirmed correct patient, procedure, site, and patient consented.   Anesthesia Topical anesthesia was used. Anesthetic medications included Proparacaine 0.5%, Lidocaine 2%.   Procedure Preparation included 5% betadine to ocular surface, eyelid speculum. A (32g) needle was used.   Injection:  2 mg aflibercept Alfonse Flavors) SOLN   NDC: A3590391, Lot: [id number], Expiration date: 12/13/2020   Route: Intravitreal, Site: Left Eye, Waste: 0.05 mL  Post-op Post injection exam found visual acuity of at least counting fingers. The patient tolerated the procedure well. There were no complications. The patient received written and verbal post procedure care education. Post injection medications were not given.                 ASSESSMENT/PLAN:    ICD-10-CM   1. 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, associated with type 2 diabetes mellitus (HCC)  U93.2355 Intravitreal Injection, Pharmacologic Agent - OS - Left Eye    aflibercept (EYLEA) SOLN 2 mg  2. Retinal edema  H35.81 OCT, Retina - OU - Both Eyes  3. Essential hypertension  I10   4. Hypertensive retinopathy of both eyes  H35.033   5. Combined forms of age-related cataract of right eye  H25.811   6. 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 History of herpes zoster of eye  Z86.19   9. Primary open angle glaucoma of both eyes, unspecified glaucoma stage  H40.1130     1,2. Mild non-proliferative diabetic retinopathy OU  (OS>OD) - FA 7.26.21 OD: Hazy images.  Single focal MA superior to fovea.                                  OS: Perifoveal Mas w/late leakage - OCT shows diabetic macular edema OU (OS>OD) - s/p IVA OS # 1(07.26.21), #2 (08.24.21), #3 (09.21.21), #4 (10.22.21) -- IVA resistance - IVE OS #1 (11.22.21) - BCVA OS stable at 20/30  - OCT shows persistent IRF OS  - recommend IVE OS #2 today, 12.21.21 - pt wishes to proceed with IVE OS #2 today, 12.21.21 - RBA of procedure discussed, questions answered - IVA informed consent obtained and signed, 07.26.21 (OS) - IVE informed consent obtained and signed, 11.22.21 (OS) - see procedure note - Eylea4U benefits investigation started, 09.21.21 -- approved as of 11.22.21 - f/u in 4 wks -- DFE/OCT, possible injection  3,4. Hypertensive retinopathy OU - discussed importance of tight BP control - monitor  5. Mixed  Cataract OD - The symptoms of cataract, surgical options, and treatments and risks were discussed with patient. - discussed diagnosis and progression - under the expert management of Dr. Kathlen Mody  6. Pseudophakia OS  - s/p CE/IOL OS (2020, Dr. Kathlen Mody)  - IOL in good position, doing well  - monitor  7. 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 Roby Lofts at [facility] W/S, who recommended SuperK prior to [place]  8. History of herpes zoster/iritis OS  - on valtrex 1g daily -- maintenance   9. POAG OU  - under the expert management of Dr. Kathlen Mody  - IOP 19,16 today  - cont latan qhs OD, cosopt qdaily OU  (cosopt was previously OS only)   Ophthalmic Meds Ordered this visit:  Meds ordered this encounter  Medications  . aflibercept (EYLEA) SOLN 2 mg      Return in about 4 weeks (around 11/01/2020) for f/u NPDR OU, DFE, OCT.  There are no Patient Instructions on file for this visit.  This document serves as a record of services personally performed by Gardiner Sleeper, MD, PhD. It was created on their behalf  by San Jetty. Owens Shark, OA an ophthalmic technician. The creation of this record is the provider's dictation and/or activities during the visit.    Electronically signed by: San Jetty. Marguerita Merles 12.21.2021 9:46 PM  Gardiner Sleeper, M.D., Ph.D. Diseases & Surgery of the Retina and [place] 10/04/2020   I have reviewed the above documentation for accuracy and completeness, and I agree with the above. Gardiner Sleeper, M.D., Ph.D. 10/04/20 9:46 PM   Abbreviations: M myopia (nearsighted); A astigmatism; H hyperopia (farsighted); P presbyopia; Mrx spectacle prescription;  CTL contact lenses; OD right eye; OS left eye; OU both eyes  XT exotropia; ET esotropia; PEK punctate epithelial keratitis; PEE punctate epithelial erosions; DES dry eye syndrome; MGD meibomian gland dysfunction; ATs artificial tears; PFAT's preservative free artificial tears; [place] nuclear sclerotic cataract; PSC posterior subcapsular cataract; ERM epi-retinal membrane; PVD posterior vitreous detachment; RD retinal detachment; DM diabetes mellitus; DR diabetic retinopathy; NPDR non-proliferative diabetic retinopathy; PDR proliferative diabetic retinopathy; CSME clinically significant macular edema; DME diabetic macular edema; dbh dot blot hemorrhages; CWS cotton wool spot; POAG primary open angle glaucoma; C/D cup-to-disc ratio; HVF humphrey visual field; GVF goldmann visual field; OCT optical coherence tomography; IOP intraocular pressure; BRVO Branch retinal vein occlusion; CRVO central retinal vein occlusion; CRAO central retinal artery occlusion; BRAO branch retinal artery occlusion; RT retinal tear; SB scleral buckle; PPV pars plana vitrectomy; VH Vitreous hemorrhage; PRP panretinal laser photocoagulation; IVK intravitreal kenalog; VMT vitreomacular traction; MH Macular hole;  NVD neovascularization of the disc; NVE neovascularization elsewhere; AREDS age related eye disease study; ARMD age related macular  degeneration; POAG primary open angle glaucoma; EBMD epithelial/anterior basement membrane dystrophy; ACIOL anterior chamber intraocular lens; IOL intraocular lens; PCIOL posterior chamber intraocular lens; Phaco/IOL phacoemulsification with intraocular lens placement; [place] photorefractive keratectomy; LASIK laser assisted in situ keratomileusis; HTN hypertension; DM diabetes mellitus; COPD chronic obstructive pulmonary disease

## 2020-10-04 ENCOUNTER — Other Ambulatory Visit: Payer: Self-pay

## 2020-10-04 ENCOUNTER — Ambulatory Visit (INDEPENDENT_AMBULATORY_CARE_PROVIDER_SITE_OTHER): Payer: Medicare Other | Admitting: Ophthalmology

## 2020-10-04 ENCOUNTER — Encounter (INDEPENDENT_AMBULATORY_CARE_PROVIDER_SITE_OTHER): Payer: Self-pay | Admitting: Ophthalmology

## 2020-10-04 DIAGNOSIS — E113213 Type 2 diabetes mellitus with mild nonproliferative diabetic retinopathy with macular edema, bilateral: Secondary | ICD-10-CM | POA: Diagnosis not present

## 2020-10-04 DIAGNOSIS — H18523 Epithelial (juvenile) corneal dystrophy, bilateral: Secondary | ICD-10-CM

## 2020-10-04 DIAGNOSIS — I10 Essential (primary) hypertension: Secondary | ICD-10-CM

## 2020-10-04 DIAGNOSIS — H401130 Primary open-angle glaucoma, bilateral, stage unspecified: Secondary | ICD-10-CM

## 2020-10-04 DIAGNOSIS — H3581 Retinal edema: Secondary | ICD-10-CM

## 2020-10-04 DIAGNOSIS — H35033 Hypertensive retinopathy, bilateral: Secondary | ICD-10-CM

## 2020-10-04 DIAGNOSIS — Z8619 Personal history of other infectious and parasitic diseases: Secondary | ICD-10-CM

## 2020-10-04 DIAGNOSIS — Z961 Presence of intraocular lens: Secondary | ICD-10-CM

## 2020-10-04 DIAGNOSIS — H25811 Combined forms of age-related cataract, right eye: Secondary | ICD-10-CM

## 2020-10-04 MED ORDER — AFLIBERCEPT 2MG/0.05ML IZ SOLN FOR KALEIDOSCOPE
2.0000 mg | INTRAVITREAL | Status: AC | PRN
  Administered 2020-10-04: 2 mg via INTRAVITREAL

## 2020-10-30 ENCOUNTER — Other Ambulatory Visit: Payer: Self-pay | Admitting: Internal Medicine

## 2020-10-30 DIAGNOSIS — I480 Paroxysmal atrial fibrillation: Secondary | ICD-10-CM

## 2020-10-31 NOTE — Telephone Encounter (Signed)
Prescription refill request for Eliquis received. Indication: PAF Last office visit: 09/21/20 Scr: 0.95 Age: 80 Weight: 111kg

## 2020-11-02 ENCOUNTER — Other Ambulatory Visit: Payer: Self-pay | Admitting: Cardiovascular Disease

## 2020-11-02 NOTE — Progress Notes (Signed)
[facility] Note  11/03/2020     CHIEF COMPLAINT Patient presents for Retina Follow Up   HISTORY OF PRESENT ILLNESS: Lucas Dudley is a 80 y.o. male who presents to the clinic today for:   HPI    Retina Follow Up    Patient presents with  Diabetic Retinopathy.  In both eyes.  This started months ago.  Severity is moderate.  Duration of 4 weeks.  Since onset it is stable.  I, the attending physician,  performed the HPI with the patient and updated documentation appropriately.          Comments    80 y/o male pt here for 4 wk f/u for mild NPDR OU.  No change in [place] OU.  Feels like he sees pretty well if he has plenty of light.  Denies pain, FOL, floaters.  Latanoprost QHS OD, Cosopt QD OS.  BS and A1C unknown.       Last edited by Bernarda Caffey, MD on 11/03/2020  1:17 PM. (History)    pt states vision is about the same, he states he needs a lot of light to see well  Referring physician: [name], MD [place],  [place] [address]  HISTORICAL INFORMATION:   Selected notes from the MEDICAL RECORD NUMBER Referred by Dr. Kathlen Mody for DEE   CURRENT MEDICATIONS: Current Outpatient Medications (Ophthalmic Drugs)  Medication Sig  . Carboxymethylcellul-Glycerin (LUBRICATING EYE DROPS OP) Apply 1 drop to eye daily as needed (itching eyes).  . dorzolamide-timolol (COSOPT) 22.3-6.8 MG/ML ophthalmic solution Place 1 drop into both eyes 2 (two) times daily.  [name] latanoprost (XALATAN) 0.005 % ophthalmic solution Place 1 drop into both eyes at bedtime.    No current facility-administered medications for this visit. (Ophthalmic Drugs)   Current Outpatient Medications (Other)  Medication Sig  . amLODipine (NORVASC) 10 MG tablet TAKE 1 TABLET BY MOUTH EVERY DAY  . amLODipine (NORVASC) 5 MG tablet Take 5 mg by mouth daily.  . carvedilol (COREG) 6.25 MG tablet Takes 1/2 tablet by mouth in the AM and 1 tablet by mouth in the PM  . ELDERBERRY PO Take  by mouth daily.  [name] ELIQUIS 5 MG TABS tablet TAKE 1 TABLET TWICE A DAY  . furosemide (LASIX) 40 MG tablet TAKE 1 TABLET BY MOUTH EVERY DAY  . gemfibrozil (LOPID) 600 MG tablet Take 600 mg by mouth at bedtime.   [name] glimepiride (AMARYL) 2 MG tablet Take 2 mg by mouth 2 (two) times daily.   . Ibuprofen-diphenhydrAMINE Cit (ADVIL PM PO) Take by mouth at bedtime.  . Insulin Glargine (BASAGLAR KWIKPEN) 100 UNIT/ML SOPN Inject 40 Units into the skin at bedtime.   [name] levofloxacin (LEVAQUIN) 500 MG tablet Take 500 mg by mouth daily.  [name] lisinopril (ZESTRIL) 40 MG tablet TAKE 1 TABLET BY MOUTH EVERY DAY  . MAGNESIUM PO Take by mouth.  . MELATONIN PO Take by mouth.  . metFORMIN (GLUCOPHAGE-XR) 500 MG 24 hr tablet Take 1,000 mg by mouth 2 (two) times daily.  . Omega-3 Fatty Acids (FISH OIL PO) Take by mouth daily.  . valACYclovir (VALTREX) 1000 MG tablet Take 1,000 mg by mouth daily.    No current facility-administered medications for this visit. (Other)   REVIEW OF SYSTEMS: ROS    Positive for: Endocrine, Cardiovascular, Eyes, Respiratory   Negative for: Constitutional, Gastrointestinal, Neurological, Skin, Genitourinary, Musculoskeletal, HENT, Psychiatric, Allergic/Imm, Heme/Lymph   Last edited by Matthew Folks, COA  on 11/03/2020  1:04 PM. (History)     ALLERGIES Allergies  Allergen Reactions  . Tape Rash and Other (See Comments)    Causes skin redness, Use paper tape only.   PAST MEDICAL HISTORY Past Medical History:  Diagnosis Date  . Arthritis    "hands probably" (05/04/2015)  . CAD (coronary artery disease)    a. Cath 03/17/15 showing 100% ostial D1, 50% prox LAD to mid LAD, 40% RPDA stenosis. Med rx. // Myoview 01/2020: EF 31 diffuse perfusion defect without reversibility (suspect artifact); reviewed with Dr. Aggie Cosier study felt to be low risk  . Cataract    Mixed form OD  . Chronic diastolic CHF (congestive heart failure) (HCC)    Echocardiogram 01/2020: EF 55-60, no RWMA, moderate  LVH, GR 1 DD, normal RV SF, RVSP 28.2, mild MR, mild aortic valve sclerosis (no aortic stenosis)  . Diabetic retinopathy ([place])    NPDR OU  . Glaucoma    POAG OU  . History of gout   . Hyperlipidemia   . Hypertension   . Hypertensive retinopathy    OU  . Kidney stones    "found during prostate OR; still in there as far as I know" (05/04/2015)  . Melanoma of neck ([place])    "right; came back IV; no chemo"  . NICM (nonischemic cardiomyopathy) ([place])   . OSA on CPAP 2012  . Paroxysmal atrial fibrillation (HCC)    a. 04/2015 Tikosyn loading, DCCV x 1;  b. CHA2DS2VASc = 5-->Eliquis.  . Prostate cancer ([place])   . Type II diabetes mellitus ([place])    Past Surgical History:  Procedure Laterality Date  . ABDOMINAL HERNIA REPAIR     w/mesh  . CARDIAC CATHETERIZATION N/A 03/17/2015   Procedure: Left Heart Cath and Coronary Angiography;  Surgeon: Lorretta Harp, MD;  Location: [facility];  Service: Cardiovascular;  Laterality: N/A;  . carotid doppler  09/17/2008   rigt and left ICAs 0-49%;mildly  abnormal  . CATARACT EXTRACTION Left 2020   Dr. Kathlen Mody  . COLONOSCOPY N/A 05/16/2017   Procedure: COLONOSCOPY;  Surgeon: Rogene Houston, MD;  [facility];  Service: Endoscopy;  Laterality: N/A;  930  . DOPPLER ECHOCARDIOGRAPHY  05/25/2009   EF 50-55%,LA mildly dilated, LV function normal  . ELECTROPHYSIOLOGIC STUDY N/A 04/05/2015   Procedure: Cardioversion;  Surgeon: Sanda Klein, MD;  Location: [facility];  Service: Cardiovascular;  Laterality: N/A;  . ELECTROPHYSIOLOGIC STUDY N/A 09/06/2015   Procedure: Atrial Fibrillation Ablation;  Surgeon: Thompson Grayer, MD;  Location: [facility];  Service: Cardiovascular;  Laterality: N/A;  . ELECTROPHYSIOLOGIC STUDY N/A 07/12/2016   redo afib ablation by Dr Rayann Heman  . EXCISIONAL HEMORRHOIDECTOMY     "inside and out"  . EYE SURGERY Left 2020   Cat Sx - Dr. Kathlen Mody  . FINE NEEDLE ASPIRATION Right    knee; "drew ~ 1 quart off"   . HERNIA REPAIR    . LAPAROSCOPIC CHOLECYSTECTOMY    . MELANOMA EXCISION Right    "neck"  . NM MYOCAR PERF WALL MOTION  02/21/2012   EF 61% ,EXERCISE 7 METS. exercise stopped due to wheezing and shortness of breathe  . POLYPECTOMY  05/16/2017   Procedure: POLYPECTOMY;  Surgeon: Rogene Houston, MD;  [facility];  Service: Endoscopy;;  colon  . PROSTATECTOMY    . SHOULDER OPEN ROTATOR CUFF REPAIR Right X 2  . TEE WITHOUT CARDIOVERSION N/A 09/05/2015   Procedure: TRANSESOPHAGEAL ECHOCARDIOGRAM (  TEE);  Surgeon: Sueanne Margarita, MD;  Location: [facility];  Service: Cardiovascular;  Laterality: N/A;   FAMILY HISTORY Family History  Problem Relation Age of Onset  . Heart disease Mother   . Lung cancer Mother   . Heart attack Mother 60  . Stroke Brother   . Healthy Daughter   . Diabetes Father   . Heart disease Father    SOCIAL HISTORY Social History   Tobacco Use  . Smoking status: Former Smoker    Packs/day: 2.00    Years: 27.00    Pack years: 54.00    Types: Cigarettes    Quit date: 10/31/1983    Years since quitting: 37.0  . Smokeless tobacco: Never Used  Substance Use Topics  . Alcohol use: No    Alcohol/week: 0.0 standard drinks    Comment: "used to drink; stopped ~ 2008"  . Drug use: No         OPHTHALMIC EXAM:  Base Eye Exam    Visual Acuity (Snellen - Linear)      Right Left   Dist cc 20/40 20/30 -2   Dist ph cc 20/30 -2 NI   Correction: Glasses       Tonometry (Tonopen, 1:07 PM)      Right Left   Pressure 18 17       Pupils      Dark Light Shape React APD   Right 3 2 Round Brisk None   Left 3 2 Round Brisk None       Visual Fields (Counting fingers)      Left Right    Full Full       Extraocular Movement      Right Left    Full, Ortho Full, Ortho       Neuro/Psych    Oriented x3: Yes   Mood/Affect: Normal       Dilation    Both eyes: 1.0% Mydriacyl, 2.5% Phenylephrine @ 1:07 PM        Slit Lamp and Fundus Exam     Slit Lamp Exam      Right Left   Lids/Lashes Dermato, mild MGD Dermato, mild MGD   Conjunctiva/Sclera Temporal pinguecula Temporal pinguecula   Cornea EBMD, mild haze, 1-2+ PEE, mild Debris in tear film EBMD, mild haze, trace PEE, well healed temporal cataract wounds, arcus   Anterior Chamber Deep and clear; narrow temporal angle Deep and clear   Iris Round and dilated, mild anterior bowing, No NVI Round and moderately dilated to 5.2mm   Lens 3+ NS with brunesence, 2-3+ cortical PCIOL, trace PCO - non central   Vitreous Synerisis Synerisis       Fundus Exam      Right Left   Disc Pink, sharp, +cupping, central pallor, Thin inferior rim Pink and sharp, +cupping, +PPA   C/D Ratio 0.7 0.6   Macula Flat, blunted foveal reflex, trace central cystic changes - persistent, stable from prior, RPE mottling and clumping, no heme Flat, blunted foveal reflex, clusters of fine MA nasal and superior to fovea -- improving, +cystic changes -- persistent, focal exudates superior to fovea - improving    Vessels Attenuated, mild tortuoisty Attenuated, mild av crossing changes, mild tortuousity   Periphery Attached, Focal dot heme below nerve (0600) - improved, rare MA Attached.  No heme.          IMAGING AND PROCEDURES  Imaging and Procedures for 11/03/2020  OCT, Retina - OU - Both Eyes  Right Eye Quality was borderline. Central Foveal Thickness: 304. Progression has been stable. Findings include abnormal foveal contour, no SRF, vitreomacular adhesion , intraretinal fluid (Persistent cystic changes superior fovea ).   Left Eye Quality was good. Central Foveal Thickness: 467. Progression has improved. Findings include abnormal foveal contour, intraretinal fluid, intraretinal hyper-reflective material (Mild interval improvement in IRF/cystic changes; partial PVD).   Notes *Images captured and stored on drive  Diagnosis / Impression:  Mild DME OU (OS > OD) OD: Persistent cystic changes superior  fovea  OS: Mild interval improvement in IRF/cystic changes; partial PVD  Clinical management:  See below  Abbreviations: NFP - Normal foveal profile. CME - cystoid macular edema. PED - pigment epithelial detachment. IRF - intraretinal fluid. SRF - subretinal fluid. EZ - ellipsoid zone. ERM - epiretinal membrane. ORA - outer retinal atrophy. ORT - outer retinal tubulation. SRHM - subretinal hyper-reflective material. IRHM - intraretinal hyper-reflective material       Intravitreal Injection, Pharmacologic Agent - OS - Left Eye       Time Out 11/03/2020. 1:11 PM. Confirmed correct patient, procedure, site, and patient consented.   Anesthesia Topical anesthesia was used. Anesthetic medications included Proparacaine 0.5%, Lidocaine 2%.   Procedure Preparation included 5% betadine to ocular surface, eyelid speculum. A (32g) needle was used.   Injection:  2 mg aflibercept Alfonse Flavors) SOLN   NDC: [id number], Lot: [id number], Expiration date: 02/11/2021   Route: Intravitreal, Site: Left Eye, Waste: 0.05 mL  Post-op Post injection exam found visual acuity of at least counting fingers. The patient tolerated the procedure well. There were no complications. The patient received written and verbal post procedure care education. Post injection medications were not given.                 ASSESSMENT/PLAN:    ICD-10-CM   1. 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, associated with type 2 diabetes mellitus (HCC)  [id number] Intravitreal Injection, Pharmacologic Agent - OS - Left Eye    aflibercept (EYLEA) SOLN 2 mg  2. Retinal edema  H35.81 OCT, Retina - OU - Both Eyes  3. Essential hypertension  I10   4. Hypertensive retinopathy of both eyes  H35.033   5. Combined forms of age-related cataract of right eye  H25.811   6. 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 History of herpes zoster of  eye  Z86.19   9. Primary open angle glaucoma of both eyes, unspecified glaucoma stage  H40.1130     1,2. 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  Single focal MA superior to fovea.                                  OS: Perifoveal Mas w/late leakage - OCT shows diabetic macular edema OU (OS>OD) - s/p IVA OS # 1(07.26.21), #2 (08.24.21), #3 (09.21.21), #4 (10.22.21) -- IVA resistance - IVE OS #1 (11.22.21), #2 (12.21.21) - BCVA OS stable at 20/30  - OCT shows persistent IRF OS  - recommend IVE OS #3 today, 01.20.22 - pt wishes to proceed with IVE OS #3 today, 01.20.22 - RBA of procedure discussed, questions answered - IVA informed consent obtained and signed, 07.26.21 (OS) - IVE informed consent obtained and signed, 11.22.21 (OS) - see procedure note - Eylea4U benefits investigation started,  09.21.21 -- approved as of 11.22.21 - f/u in 4 wks -- DFE/OCT, possible injection  3,4. Hypertensive retinopathy OU - discussed importance of tight BP control - monitor  5. Mixed Cataract OD - The symptoms of cataract, surgical options, and treatments and risks were discussed with patient. - discussed diagnosis and progression - under the expert management of Dr. Kathlen Mody  6. Pseudophakia OS  - s/p CE/IOL OS (2020, Dr. Kathlen Mody)  - IOL in good position, doing well  - monitor  7. 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 Roby Lofts at [facility], who recommended SuperK prior to [place]  8. History of herpes zoster/iritis OS  - on valtrex 1g daily -- maintenance    9. POAG OU  - under the expert management of Dr. Kathlen Mody  - IOP 18,17 today  - cont latan qhs OD, cosopt qdaily OU  (cosopt was previously OS only)   Ophthalmic Meds Ordered this visit:  Meds ordered this encounter  Medications  . aflibercept (EYLEA) SOLN 2 mg      Return in about 4 weeks (around 12/01/2020) for f/u NPDR OU, DFE, OCT.  There are no  Patient Instructions on file for this visit.  This document serves as a record of services personally performed by Gardiner Sleeper, MD, PhD. It was created on their behalf by Leonie Douglas, an ophthalmic technician. The creation of this record is the provider's dictation and/or activities during the visit.    Electronically signed by: Leonie Douglas COA, 11/03/20  1:52 PM   This document serves as a record of services personally performed by Gardiner Sleeper, MD, PhD. It was created on their behalf by San Jetty. Owens Shark, OA an ophthalmic technician. The creation of this record is the provider's dictation and/or activities during the visit.    Electronically signed by: San Jetty. [place], [place] 01.20.2022 1:52 PM   Gardiner Sleeper, M.D., Ph.D. Diseases & Surgery of the Retina and Vitreous [facility]  I have reviewed the above documentation for accuracy and completeness, and I agree with the above. Gardiner Sleeper, M.D., Ph.D. 11/03/20 1:52 PM   Abbreviations: M myopia (nearsighted); A astigmatism; H hyperopia (farsighted); P presbyopia; Mrx spectacle prescription;  CTL contact lenses; OD right eye; OS left eye; OU both eyes  XT exotropia; ET esotropia; PEK punctate epithelial keratitis; PEE punctate epithelial erosions; DES dry eye syndrome; MGD meibomian gland dysfunction; ATs artificial tears; PFAT's preservative free artificial tears; [place] nuclear sclerotic cataract; PSC posterior subcapsular cataract; ERM epi-retinal membrane; PVD posterior vitreous detachment; RD retinal detachment; DM diabetes mellitus; DR diabetic retinopathy; NPDR non-proliferative diabetic retinopathy; PDR proliferative diabetic retinopathy; CSME clinically significant macular edema; DME diabetic macular edema; dbh dot blot hemorrhages; CWS cotton wool spot; POAG primary open angle glaucoma; C/D cup-to-disc ratio; HVF humphrey visual field; GVF goldmann visual field; OCT optical coherence tomography; IOP  intraocular pressure; BRVO Branch retinal vein occlusion; CRVO central retinal vein occlusion; CRAO central retinal artery occlusion; BRAO branch retinal artery occlusion; RT retinal tear; SB scleral buckle; PPV pars plana vitrectomy; VH Vitreous hemorrhage; PRP panretinal laser photocoagulation; IVK intravitreal kenalog; VMT vitreomacular traction; MH Macular hole;  NVD neovascularization of the disc; NVE neovascularization elsewhere; AREDS age related eye disease study; ARMD age related macular degeneration; POAG primary open angle glaucoma; EBMD epithelial/anterior basement membrane dystrophy; ACIOL anterior chamber intraocular lens; IOL intraocular lens; PCIOL posterior chamber intraocular lens;  Phaco/IOL phacoemulsification with intraocular lens placement; [place] photorefractive keratectomy; LASIK laser assisted in situ keratomileusis; HTN hypertension; DM diabetes mellitus; COPD chronic obstructive pulmonary disease

## 2020-11-03 ENCOUNTER — Encounter (INDEPENDENT_AMBULATORY_CARE_PROVIDER_SITE_OTHER): Payer: Self-pay | Admitting: Ophthalmology

## 2020-11-03 ENCOUNTER — Other Ambulatory Visit: Payer: Self-pay

## 2020-11-03 ENCOUNTER — Ambulatory Visit (INDEPENDENT_AMBULATORY_CARE_PROVIDER_SITE_OTHER): Payer: Medicare Other | Admitting: Ophthalmology

## 2020-11-03 DIAGNOSIS — I10 Essential (primary) hypertension: Secondary | ICD-10-CM | POA: Diagnosis not present

## 2020-11-03 DIAGNOSIS — H35033 Hypertensive retinopathy, bilateral: Secondary | ICD-10-CM | POA: Diagnosis not present

## 2020-11-03 DIAGNOSIS — E113213 Type 2 diabetes mellitus with mild nonproliferative diabetic retinopathy with macular edema, bilateral: Secondary | ICD-10-CM | POA: Diagnosis not present

## 2020-11-03 DIAGNOSIS — H401130 Primary open-angle glaucoma, bilateral, stage unspecified: Secondary | ICD-10-CM

## 2020-11-03 DIAGNOSIS — H18523 Epithelial (juvenile) corneal dystrophy, bilateral: Secondary | ICD-10-CM

## 2020-11-03 DIAGNOSIS — Z8619 Personal history of other infectious and parasitic diseases: Secondary | ICD-10-CM

## 2020-11-03 DIAGNOSIS — H3581 Retinal edema: Secondary | ICD-10-CM

## 2020-11-03 DIAGNOSIS — Z961 Presence of intraocular lens: Secondary | ICD-10-CM

## 2020-11-03 DIAGNOSIS — H25811 Combined forms of age-related cataract, right eye: Secondary | ICD-10-CM

## 2020-11-03 MED ORDER — AFLIBERCEPT 2MG/0.05ML IZ SOLN FOR KALEIDOSCOPE
2.0000 mg | INTRAVITREAL | Status: AC | PRN
  Administered 2020-11-03: 2 mg via INTRAVITREAL

## 2020-11-04 ENCOUNTER — Encounter (INDEPENDENT_AMBULATORY_CARE_PROVIDER_SITE_OTHER): Payer: Medicare Other | Admitting: Ophthalmology

## 2020-11-22 ENCOUNTER — Other Ambulatory Visit: Payer: Self-pay | Admitting: Cardiology

## 2020-11-22 DIAGNOSIS — I10 Essential (primary) hypertension: Secondary | ICD-10-CM

## 2020-11-23 ENCOUNTER — Other Ambulatory Visit (HOSPITAL_COMMUNITY): Payer: Self-pay | Admitting: Family Medicine

## 2020-11-23 ENCOUNTER — Other Ambulatory Visit: Payer: Self-pay | Admitting: Physician Assistant

## 2020-11-23 DIAGNOSIS — I5032 Chronic diastolic (congestive) heart failure: Secondary | ICD-10-CM

## 2020-11-24 ENCOUNTER — Other Ambulatory Visit: Payer: Self-pay

## 2020-11-24 ENCOUNTER — Ambulatory Visit (HOSPITAL_COMMUNITY)
Admission: RE | Admit: 2020-11-24 | Discharge: 2020-11-24 | Disposition: A | Discharge status: Home / Self Care | Payer: Medicare Other | Source: Ambulatory Visit | Attending: Family Medicine | Admitting: Family Medicine

## 2020-11-24 DIAGNOSIS — I5032 Chronic diastolic (congestive) heart failure: Secondary | ICD-10-CM | POA: Insufficient documentation

## 2020-11-30 NOTE — Progress Notes (Signed)
[facility] Note  12/02/2020     CHIEF COMPLAINT Patient presents for Retina Follow Up   HISTORY OF PRESENT ILLNESS: Lucas Dudley is a 80 y.o. male who presents to the clinic today for:   HPI    Retina Follow Up    Patient presents with  Diabetic Retinopathy.  In both eyes.  This started weeks ago.  Severity is moderate.  Duration of weeks.  Since onset it is stable.  I, the attending physician,  performed the HPI with the patient and updated documentation appropriately.          Comments    Pt states vision is the same OU.  Pt complains of black spot in vision--doesn't know which eye.  Pt denies eye pain or discomfort.       Last edited by Bernarda Caffey, MD on 12/02/2020  2:19 PM. (History)    pt is concerned about his IOP today, he states he has been using latanoprost QHS OS and Cosopt Qdaily OD, he states no change in vision, pt states his A1c was 9.6 on Tuesday which was up from 7.0, he states his dr did not make any changes to his medications, but told him to change his diet  Referring physician: [name], MD [place],  [place] [phone]  HISTORICAL INFORMATION:   Selected notes from the MEDICAL RECORD NUMBER Referred by Dr. Kathlen Mody for DEE   CURRENT MEDICATIONS: Current Outpatient Medications (Ophthalmic Drugs)  Medication Sig  . Carboxymethylcellul-Glycerin (LUBRICATING EYE DROPS OP) Apply 1 drop to eye daily as needed (itching eyes).  . dorzolamide-timolol (COSOPT) 22.3-6.8 MG/ML ophthalmic solution Place 1 drop into both eyes 2 (two) times daily.  [name] latanoprost (XALATAN) 0.005 % ophthalmic solution Place 1 drop into both eyes at bedtime.    No current facility-administered medications for this visit. (Ophthalmic Drugs)   Current Outpatient Medications (Other)  Medication Sig  . amLODipine (NORVASC) 10 MG tablet TAKE 1 TABLET BY MOUTH EVERY DAY  . amLODipine (NORVASC) 5 MG tablet Take 5 mg by mouth daily.  .  carvedilol (COREG) 6.25 MG tablet Takes 1/2 tablet by mouth in the AM and 1 tablet by mouth in the PM  . ELDERBERRY PO Take by mouth daily.  [name] ELIQUIS 5 MG TABS tablet TAKE 1 TABLET TWICE A DAY  . furosemide (LASIX) 40 MG tablet TAKE 1 TABLET BY MOUTH EVERY DAY  . gemfibrozil (LOPID) 600 MG tablet Take 600 mg by mouth at bedtime.   [name] glimepiride (AMARYL) 2 MG tablet Take 2 mg by mouth 2 (two) times daily.   . Ibuprofen-diphenhydrAMINE Cit (ADVIL PM PO) Take by mouth at bedtime.  . Insulin Glargine (BASAGLAR KWIKPEN) 100 UNIT/ML SOPN Inject 40 Units into the skin at bedtime.   [name] levofloxacin (LEVAQUIN) 500 MG tablet Take 500 mg by mouth daily.  [name] lisinopril (ZESTRIL) 40 MG tablet TAKE 1 TABLET BY MOUTH EVERY DAY  . MAGNESIUM PO Take by mouth.  . MELATONIN PO Take by mouth.  . metFORMIN (GLUCOPHAGE-XR) 500 MG 24 hr tablet Take 1,000 mg by mouth 2 (two) times daily.  . Omega-3 Fatty Acids (FISH OIL PO) Take by mouth daily.  . valACYclovir (VALTREX) 1000 MG tablet Take 1,000 mg by mouth daily.    No current facility-administered medications for this visit. (Other)   REVIEW OF SYSTEMS: ROS    Positive for: Endocrine, Cardiovascular, Eyes, Respiratory   Negative for: Constitutional, Gastrointestinal, Neurological, Skin,  Genitourinary, Musculoskeletal, HENT, Psychiatric, Allergic/Imm, Heme/Lymph   Last edited by Doneen Poisson on 12/02/2020  1:09 PM. (History)     ALLERGIES Allergies  Allergen Reactions  . Tape Rash and Other (See Comments)    Causes skin redness, Use paper tape only.   PAST MEDICAL HISTORY Past Medical History:  Diagnosis Date  . Arthritis    "hands probably" (05/04/2015)  . CAD (coronary artery disease)    a. Cath 03/17/15 showing 100% ostial D1, 50% prox LAD to mid LAD, 40% RPDA stenosis. Med rx. // Myoview 01/2020: EF 31 diffuse perfusion defect without reversibility (suspect artifact); reviewed with Dr. Aggie Cosier study felt to be low risk  . Cataract    Mixed  form OD  . Chronic diastolic CHF (congestive heart failure) (HCC)    Echocardiogram 01/2020: EF 55-60, no RWMA, moderate LVH, GR 1 DD, normal RV SF, RVSP 28.2, mild MR, mild aortic valve sclerosis (no aortic stenosis)  . Diabetic retinopathy ([place])    NPDR OU  . Glaucoma    POAG OU  . History of gout   . Hyperlipidemia   . Hypertension   . Hypertensive retinopathy    OU  . Kidney stones    "found during prostate OR; still in there as far as I know" (05/04/2015)  . Melanoma of neck ([place])    "right; came back IV; no chemo"  . NICM (nonischemic cardiomyopathy) ([place])   . OSA on CPAP 2012  . Paroxysmal atrial fibrillation (HCC)    a. 04/2015 Tikosyn loading, DCCV x 1;  b. CHA2DS2VASc = 5-->Eliquis.  . Prostate cancer ([place])   . Type II diabetes mellitus ([place])    Past Surgical History:  Procedure Laterality Date  . ABDOMINAL HERNIA REPAIR     w/mesh  . CARDIAC CATHETERIZATION N/A 03/17/2015   Procedure: Left Heart Cath and Coronary Angiography;  Surgeon: Lorretta Harp, MD;  Location: [facility];  Service: Cardiovascular;  Laterality: N/A;  . carotid doppler  09/17/2008   rigt and left ICAs 0-49%;mildly  abnormal  . CATARACT EXTRACTION Left 2020   Dr. Kathlen Mody  . COLONOSCOPY N/A 05/16/2017   Procedure: COLONOSCOPY;  Surgeon: Rogene Houston, MD;  [facility];  Service: Endoscopy;  Laterality: N/A;  930  . DOPPLER ECHOCARDIOGRAPHY  05/25/2009   EF 50-55%,LA mildly dilated, LV function normal  . ELECTROPHYSIOLOGIC STUDY N/A 04/05/2015   Procedure: Cardioversion;  Surgeon: Sanda Klein, MD;  Location: [facility];  Service: Cardiovascular;  Laterality: N/A;  . ELECTROPHYSIOLOGIC STUDY N/A 09/06/2015   Procedure: Atrial Fibrillation Ablation;  Surgeon: Thompson Grayer, MD;  Location: [facility];  Service: Cardiovascular;  Laterality: N/A;  . ELECTROPHYSIOLOGIC STUDY N/A 07/12/2016   redo afib ablation by Dr Rayann Heman  . EXCISIONAL HEMORRHOIDECTOMY     "inside  and out"  . EYE SURGERY Left 2020   Cat Sx - Dr. Kathlen Mody  . FINE NEEDLE ASPIRATION Right    knee; "drew ~ 1 quart off"  . HERNIA REPAIR    . LAPAROSCOPIC CHOLECYSTECTOMY    . MELANOMA EXCISION Right    "neck"  . NM MYOCAR PERF WALL MOTION  02/21/2012   EF 61% ,EXERCISE 7 METS. exercise stopped due to wheezing and shortness of breathe  . POLYPECTOMY  05/16/2017   Procedure: POLYPECTOMY;  Surgeon: Rogene Houston, MD;  [facility];  Service: Endoscopy;;  colon  . PROSTATECTOMY    . SHOULDER OPEN ROTATOR CUFF REPAIR Right  X 2  . TEE WITHOUT CARDIOVERSION N/A 09/05/2015   Procedure: TRANSESOPHAGEAL ECHOCARDIOGRAM (TEE);  Surgeon: Sueanne Margarita, MD;  Location[facility];  Service: Cardiovascular;  Laterality: N/A;   FAMILY HISTORY Family History  Problem Relation Age of Onset  . Heart disease Mother   . Lung cancer Mother   . Heart attack Mother 34  . Stroke Brother   . Healthy Daughter   . Diabetes Father   . Heart disease Father    SOCIAL HISTORY Social History   Tobacco Use  . Smoking status: Former Smoker    Packs/day: 2.00    Years: 27.00    Pack years: 54.00    Types: Cigarettes    Quit date: 10/31/1983    Years since quitting: 37.1  . Smokeless tobacco: Never Used  Substance Use Topics  . Alcohol use: No    Alcohol/week: 0.0 standard drinks    Comment: "used to drink; stopped ~ 2008"  . Drug use: No         OPHTHALMIC EXAM:  Base Eye Exam    Visual Acuity (Snellen - Linear)      Right Left   Dist cc 20/40 -1 20/50 +1   Dist ph cc 20/30 -2 20/40 -2   Correction: Glasses       Tonometry (Tonopen, 1:15 PM)      Right Left   Pressure 26 20       Pupils      Dark Light Shape React APD   Right 3 2 Round Minimal 0   Left 3 2 Round Minimal 0       Visual Fields      Left Right    Full Full       Extraocular Movement      Right Left    Full Full       Neuro/Psych    Oriented x3: Yes   Mood/Affect: Normal       Dilation     Both eyes: 1.0% Mydriacyl, 2.5% Phenylephrine @ 1:15 PM        Slit Lamp and Fundus Exam    Slit Lamp Exam      Right Left   Lids/Lashes Dermato, mild MGD Dermato, mild MGD   Conjunctiva/Sclera Temporal pinguecula Temporal pinguecula   Cornea EBMD, mild haze, 1-2+ PEE, mild Debris in tear film EBMD, mild haze, trace PEE, well healed temporal cataract wounds, arcus   Anterior Chamber Deep and clear; narrow temporal angle Deep and clear   Iris Round and dilated, mild anterior bowing, No NVI Round and moderately dilated to 5.79mm   Lens 3+ NS with brunesence, 2-3+ cortical PCIOL, trace PCO - non central   Vitreous Synerisis Synerisis       Fundus Exam      Right Left   Disc Pink, sharp, +cupping, central pallor, Thin inferior rim Pink and sharp, +cupping, +PPA   C/D Ratio 0.7 0.6   Macula Flat, blunted foveal reflex, trace central cystic changes - persistent, RPE mottling and clumping, no heme Flat, blunted foveal reflex, clusters of fine MA nasal and superior to fovea -- improving, +cystic changes -- persistent, focal exudates superior to fovea - improving    Vessels attenuated, Tortuous Attenuated, mild av crossing changes, mild tortuousity   Periphery Attached, Focal dot heme below nerve (0600) - improved, rare MA Attached.  No heme.        Refraction    Wearing Rx      Sphere Cylinder Axis  Add   Right -0.75 +3.50 163 +2.50   Left -1.00 +1.50 015 +2.50          IMAGING AND PROCEDURES  Imaging and Procedures for 12/02/2020  OCT, Retina - OU - Both Eyes       Right Eye Quality was borderline. Central Foveal Thickness: 304. Progression has been stable. Findings include abnormal foveal contour, no SRF, vitreomacular adhesion , intraretinal fluid (Persistent cystic changes superior fovea ).   Left Eye Quality was good. Central Foveal Thickness: 393. Progression has improved. Findings include abnormal foveal contour, intraretinal fluid, intraretinal hyper-reflective material  (interval improvement in IRF/IRHM; partial PVD).   Notes *Images captured and stored on drive  Diagnosis / Impression:  Mild DME OU (OS > OD) OD: Persistent cystic changes superior fovea  OS: Mild interval improvement in IRF/cystic changes; partial PVD  Clinical management:  See below  Abbreviations: NFP - Normal foveal profile. CME - cystoid macular edema. PED - pigment epithelial detachment. IRF - intraretinal fluid. SRF - subretinal fluid. EZ - ellipsoid zone. ERM - epiretinal membrane. ORA - outer retinal atrophy. ORT - outer retinal tubulation. SRHM - subretinal hyper-reflective material. IRHM - intraretinal hyper-reflective material       Intravitreal Injection, Pharmacologic Agent - OS - Left Eye       Time Out 12/02/2020. 1:33 PM. Confirmed correct patient, procedure, site, and patient consented.   Anesthesia Topical anesthesia was used. Anesthetic medications included Proparacaine 0.5%, Lidocaine 2%.   Procedure Preparation included 5% betadine to ocular surface, eyelid speculum. A (32g) needle was used.   Injection:  2 mg aflibercept Alfonse Flavors) SOLN   NDC: A3590391, Lot: [id number], Expiration date: 03/13/2021   Route: Intravitreal, Site: Left Eye, Waste: 0.05 mL  Post-op Post injection exam found visual acuity of at least counting fingers. The patient tolerated the procedure well. There were no complications. The patient received written and verbal post procedure care education. Post injection medications were not given.                 ASSESSMENT/PLAN:    ICD-10-CM   1. 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, associated with type 2 diabetes mellitus (HCC)  U54.2706 Intravitreal Injection, Pharmacologic Agent - OS - Left Eye    aflibercept (EYLEA) SOLN 2 mg  2. Retinal edema  H35.81 OCT, Retina - OU - Both Eyes  3. Essential hypertension  I10   4. Hypertensive retinopathy of both eyes  H35.033   5. Combined  forms of age-related cataract of right eye  H25.811   6. 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 History of herpes zoster of eye  Z86.19   9. Primary open angle glaucoma of both eyes, unspecified glaucoma stage  H40.1130     1,2. 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 Single focal MA superior to fovea.                                  OS: Perifoveal Mas w/late leakage - OCT shows diabetic macular edema OU (OS>OD) - s/p IVA OS # 1(07.26.21), #2 (08.24.21), #3 (09.21.21), #4 (10.22.21) -- IVA resistance - IVE OS #1 (11.22.21), #2 (12.21.21), #3 (01.20.22) - BCVA OS decreased to 20/40 from 20/30  - OCT shows persistent IRF OS  - recommend IVE OS #  4 today, 02.18.22 - pt wishes to proceed with IVE OS #3 today, 01.20.22 - RBA of procedure discussed, questions answered - IVA informed consent obtained and signed, 07.26.21 (OS) - IVE informed consent obtained and signed, 11.22.21 (OS) - see procedure note - Eylea4U benefits investigation started, 09.21.21 -- approved as of 11.22.21 - f/u in 4 wks -- DFE/OCT, possible injection  3,4. Hypertensive retinopathy OU - discussed importance of tight BP control - monitor  5. Mixed Cataract OD - The symptoms of cataract, surgical options, and treatments and risks were discussed with patient. - discussed diagnosis and progression - under the expert management of Dr. Kathlen Mody  6. Pseudophakia OS  - s/p CE/IOL OS (2020, Dr. Kathlen Mody)  - IOL in good position, doing well  - monitor  7. 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 Roby Lofts at [facility], who recommended SuperK prior to [place]  8. History of herpes zoster/iritis OS  - on valtrex 1g daily -- maintenance    9. POAG OU  - under the expert management of Dr. Kathlen Mody  - IOP 26,20 today  - currently on latan qhs OD, cosopt qdaily OU  - recommend stopping  latan and increase Cosopt to BID OU   Ophthalmic Meds Ordered this visit:  Meds ordered this encounter  Medications  . dorzolamide-timolol (COSOPT) 22.3-6.8 MG/ML ophthalmic solution    Sig: Place 1 drop into both eyes 2 (two) times daily.    Dispense:  10 mL    Refill:  6  . aflibercept (EYLEA) SOLN 2 mg      Return in about 4 weeks (around 12/30/2020) for f/u NPDR OU, DFE, OCT.  There are no Patient Instructions on file for this visit.  This document serves as a record of services personally performed by Gardiner Sleeper, MD, PhD. It was created on their behalf by San Jetty. Owens Shark, OA an ophthalmic technician. The creation of this record is the provider's dictation and/or activities during the visit.    Electronically signed by: San Jetty. Owens Shark, [place] 02.16.2022 4:22 PM   Gardiner Sleeper, M.D., Ph.D. Diseases & Surgery of the Retina and Vitreous [facility]  I have reviewed the above documentation for accuracy and completeness, and I agree with the above. Gardiner Sleeper, M.D., Ph.D. 12/02/20 4:22 PM   Abbreviations: M myopia (nearsighted); A astigmatism; H hyperopia (farsighted); P presbyopia; Mrx spectacle prescription;  CTL contact lenses; OD right eye; OS left eye; OU both eyes  XT exotropia; ET esotropia; PEK punctate epithelial keratitis; PEE punctate epithelial erosions; DES dry eye syndrome; MGD meibomian gland dysfunction; ATs artificial tears; PFAT's preservative free artificial tears; [place] nuclear sclerotic cataract; PSC posterior subcapsular cataract; ERM epi-retinal membrane; PVD posterior vitreous detachment; RD retinal detachment; DM diabetes mellitus; DR diabetic retinopathy; NPDR non-proliferative diabetic retinopathy; PDR proliferative diabetic retinopathy; CSME clinically significant macular edema; DME diabetic macular edema; dbh dot blot hemorrhages; CWS cotton wool spot; POAG primary open angle glaucoma; C/D cup-to-disc ratio; HVF humphrey visual  field; GVF goldmann visual field; OCT optical coherence tomography; IOP intraocular pressure; BRVO Branch retinal vein occlusion; CRVO central retinal vein occlusion; CRAO central retinal artery occlusion; BRAO branch retinal artery occlusion; RT retinal tear; SB scleral buckle; PPV pars plana vitrectomy; VH Vitreous hemorrhage; PRP panretinal laser photocoagulation; IVK intravitreal kenalog; VMT vitreomacular traction; MH Macular hole;  NVD neovascularization of the disc; NVE neovascularization elsewhere; AREDS age  related eye disease study; ARMD age related macular degeneration; POAG primary open angle glaucoma; EBMD epithelial/anterior basement membrane dystrophy; ACIOL anterior chamber intraocular lens; IOL intraocular lens; PCIOL posterior chamber intraocular lens; Phaco/IOL phacoemulsification with intraocular lens placement; [place] photorefractive keratectomy; LASIK laser assisted in situ keratomileusis; HTN hypertension; DM diabetes mellitus; COPD chronic obstructive pulmonary disease

## 2020-12-02 ENCOUNTER — Encounter (INDEPENDENT_AMBULATORY_CARE_PROVIDER_SITE_OTHER): Payer: Self-pay | Admitting: Ophthalmology

## 2020-12-02 ENCOUNTER — Ambulatory Visit (INDEPENDENT_AMBULATORY_CARE_PROVIDER_SITE_OTHER): Payer: Medicare Other | Admitting: Ophthalmology

## 2020-12-02 ENCOUNTER — Other Ambulatory Visit: Payer: Self-pay

## 2020-12-02 DIAGNOSIS — H18523 Epithelial (juvenile) corneal dystrophy, bilateral: Secondary | ICD-10-CM

## 2020-12-02 DIAGNOSIS — H35033 Hypertensive retinopathy, bilateral: Secondary | ICD-10-CM | POA: Diagnosis not present

## 2020-12-02 DIAGNOSIS — H25811 Combined forms of age-related cataract, right eye: Secondary | ICD-10-CM

## 2020-12-02 DIAGNOSIS — H3581 Retinal edema: Secondary | ICD-10-CM

## 2020-12-02 DIAGNOSIS — I10 Essential (primary) hypertension: Secondary | ICD-10-CM | POA: Diagnosis not present

## 2020-12-02 DIAGNOSIS — H401130 Primary open-angle glaucoma, bilateral, stage unspecified: Secondary | ICD-10-CM

## 2020-12-02 DIAGNOSIS — E113213 Type 2 diabetes mellitus with mild nonproliferative diabetic retinopathy with macular edema, bilateral: Secondary | ICD-10-CM | POA: Diagnosis not present

## 2020-12-02 DIAGNOSIS — Z961 Presence of intraocular lens: Secondary | ICD-10-CM

## 2020-12-02 DIAGNOSIS — Z8619 Personal history of other infectious and parasitic diseases: Secondary | ICD-10-CM

## 2020-12-02 MED ORDER — DORZOLAMIDE HCL-TIMOLOL MAL 2-0.5 % OP SOLN: 1.0000 [drp] | Freq: Two times a day (BID) | OPHTHALMIC | 6 refills | Status: DC

## 2020-12-02 MED ORDER — AFLIBERCEPT 2MG/0.05ML IZ SOLN FOR KALEIDOSCOPE
2.0000 mg | INTRAVITREAL | Status: AC | PRN
Start: 2020-12-02 — End: 2020-12-02
  Administered 2020-12-02: 2 mg via INTRAVITREAL

## 2020-12-28 NOTE — Progress Notes (Signed)
[facility] Note  12/30/2020     CHIEF COMPLAINT Patient presents for Retina Follow Up   HISTORY OF PRESENT ILLNESS: Lucas Dudley is a 80 y.o. male who presents to the clinic today for:   HPI    Retina Follow Up    Patient presents with  Diabetic Retinopathy.  In both eyes.  This started 4 weeks ago.  I, the attending physician,  performed the HPI with the patient and updated documentation appropriately.          Comments    Patient here for 4 weeks retina follow up for NPDR OU. Patient states vision pretty good till puts drops in eyes in am. Everything blurs up. No eye pain.        Last edited by Bernarda Caffey, MD on 12/30/2020  3:23 PM. (History)    pt states when he woke up this morning, he could see very well, he states once he put Cosopt in, his vision became blurry, pt does not check his blood pressure at home, but states he takes his medication as directed   Referring physician: [name], MD [address],  [place] [address]  HISTORICAL INFORMATION:   Selected notes from the MEDICAL RECORD NUMBER Referred by Dr. Kathlen Mody for DEE   CURRENT MEDICATIONS: Current Outpatient Medications (Ophthalmic Drugs)  Medication Sig  . Carboxymethylcellul-Glycerin (LUBRICATING EYE DROPS OP) Apply 1 drop to eye daily as needed (itching eyes).  . dorzolamide-timolol (COSOPT) 22.3-6.8 MG/ML ophthalmic solution Place 1 drop into both eyes 2 (two) times daily.  [name] latanoprost (XALATAN) 0.005 % ophthalmic solution Place 1 drop into both eyes at bedtime.    No current facility-administered medications for this visit. (Ophthalmic Drugs)   Current Outpatient Medications (Other)  Medication Sig  . amLODipine (NORVASC) 10 MG tablet TAKE 1 TABLET BY MOUTH EVERY DAY  . amLODipine (NORVASC) 5 MG tablet Take 5 mg by mouth daily.  . carvedilol (COREG) 6.25 MG tablet Takes 1/2 tablet by mouth in the AM and 1 tablet by mouth in the PM  . ELDERBERRY PO  Take by mouth daily.  [name] ELIQUIS 5 MG TABS tablet TAKE 1 TABLET TWICE A DAY  . furosemide (LASIX) 40 MG tablet TAKE 1 TABLET BY MOUTH EVERY DAY  . gemfibrozil (LOPID) 600 MG tablet Take 600 mg by mouth at bedtime.   [name] glimepiride (AMARYL) 2 MG tablet Take 2 mg by mouth 2 (two) times daily.   . Ibuprofen-diphenhydrAMINE Cit (ADVIL PM PO) Take by mouth at bedtime.  . Insulin Glargine (BASAGLAR KWIKPEN) 100 UNIT/ML SOPN Inject 40 Units into the skin at bedtime.   [name] levofloxacin (LEVAQUIN) 500 MG tablet Take 500 mg by mouth daily.  [name] lisinopril (ZESTRIL) 40 MG tablet TAKE 1 TABLET BY MOUTH EVERY DAY  . MAGNESIUM PO Take by mouth.  . MELATONIN PO Take by mouth.  . metFORMIN (GLUCOPHAGE-XR) 500 MG 24 hr tablet Take 1,000 mg by mouth 2 (two) times daily.  . Omega-3 Fatty Acids (FISH OIL PO) Take by mouth daily.  . valACYclovir (VALTREX) 1000 MG tablet Take 1,000 mg by mouth daily.    No current facility-administered medications for this visit. (Other)   REVIEW OF SYSTEMS: ROS    Positive for: Endocrine, Cardiovascular, Eyes, Respiratory   Negative for: Constitutional, Gastrointestinal, Neurological, Skin, Genitourinary, Musculoskeletal, HENT, Psychiatric, Allergic/Imm, Heme/Lymph   Last edited by Theodore Demark, COA on 12/30/2020  3:03 PM. (History)  ALLERGIES Allergies  Allergen Reactions  . Tape Rash and Other (See Comments)    Causes skin redness, Use paper tape only.   PAST MEDICAL HISTORY Past Medical History:  Diagnosis Date  . Arthritis    "hands probably" (05/04/2015)  . CAD (coronary artery disease)    a. Cath 03/17/15 showing 100% ostial D1, 50% prox LAD to mid LAD, 40% RPDA stenosis. Med rx. // Myoview 01/2020: EF 31 diffuse perfusion defect without reversibility (suspect artifact); reviewed with Dr. Aggie Cosier study felt to be low risk  . Cataract    Mixed form OD  . Chronic diastolic CHF (congestive heart failure) (HCC)    Echocardiogram 01/2020: EF 55-60, no RWMA,  moderate LVH, GR 1 DD, normal RV SF, RVSP 28.2, mild MR, mild aortic valve sclerosis (no aortic stenosis)  . Diabetic retinopathy ([place])    NPDR OU  . Glaucoma    POAG OU  . History of gout   . Hyperlipidemia   . Hypertension   . Hypertensive retinopathy    OU  . Kidney stones    "found during prostate OR; still in there as far as I know" (05/04/2015)  . Melanoma of neck ([place])    "right; came back IV; no chemo"  . NICM (nonischemic cardiomyopathy) ([place])   . OSA on CPAP 2012  . Paroxysmal atrial fibrillation (HCC)    a. 04/2015 Tikosyn loading, DCCV x 1;  b. CHA2DS2VASc = 5-->Eliquis.  . Prostate cancer ([place])   . Type II diabetes mellitus ([place])    Past Surgical History:  Procedure Laterality Date  . ABDOMINAL HERNIA REPAIR     w/mesh  . CARDIAC CATHETERIZATION N/A 03/17/2015   Procedure: Left Heart Cath and Coronary Angiography;  Surgeon: Lorretta Harp, MD;  Location: [facility];  Service: Cardiovascular;  Laterality: N/A;  . carotid doppler  09/17/2008   rigt and left ICAs 0-49%;mildly  abnormal  . CATARACT EXTRACTION Left 2020   Dr. Kathlen Mody  . COLONOSCOPY N/A 05/16/2017   Procedure: COLONOSCOPY;  Surgeon: Rogene Houston, MD;  [facility];  Service: Endoscopy;  Laterality: N/A;  930  . DOPPLER ECHOCARDIOGRAPHY  05/25/2009   EF 50-55%,LA mildly dilated, LV function normal  . ELECTROPHYSIOLOGIC STUDY N/A 04/05/2015   Procedure: Cardioversion;  Surgeon: Sanda Klein, MD;  Location: [facility];  Service: Cardiovascular;  Laterality: N/A;  . ELECTROPHYSIOLOGIC STUDY N/A 09/06/2015   Procedure: Atrial Fibrillation Ablation;  Surgeon: Thompson Grayer, MD;  Location: [facility];  Service: Cardiovascular;  Laterality: N/A;  . ELECTROPHYSIOLOGIC STUDY N/A 07/12/2016   redo afib ablation by Dr Rayann Heman  . EXCISIONAL HEMORRHOIDECTOMY     "inside and out"  . EYE SURGERY Left 2020   Cat Sx - Dr. Kathlen Mody  . FINE NEEDLE ASPIRATION Right    knee; "drew ~ 1  quart off"  . HERNIA REPAIR    . LAPAROSCOPIC CHOLECYSTECTOMY    . MELANOMA EXCISION Right    "neck"  . NM MYOCAR PERF WALL MOTION  02/21/2012   EF 61% ,EXERCISE 7 METS. exercise stopped due to wheezing and shortness of breathe  . POLYPECTOMY  05/16/2017   Procedure: POLYPECTOMY;  Surgeon: Rogene Houston, MD;  [facility];  Service: Endoscopy;;  colon  . PROSTATECTOMY    . SHOULDER OPEN ROTATOR CUFF REPAIR Right X 2  . TEE WITHOUT CARDIOVERSION N/A 09/05/2015   Procedure: TRANSESOPHAGEAL ECHOCARDIOGRAM (TEE);  Surgeon: Sueanne Margarita, MD;  Location: [facility]  ENDOSCOPY;  Service: Cardiovascular;  Laterality: N/A;   FAMILY HISTORY Family History  Problem Relation Age of Onset  . Heart disease Mother   . Lung cancer Mother   . Heart attack Mother 36  . Stroke Brother   . Healthy Daughter   . Diabetes Father   . Heart disease Father    SOCIAL HISTORY Social History   Tobacco Use  . Smoking status: Former Smoker    Packs/day: 2.00    Years: 27.00    Pack years: 54.00    Types: Cigarettes    Quit date: 10/31/1983    Years since quitting: 37.1  . Smokeless tobacco: Never Used  Substance Use Topics  . Alcohol use: No    Alcohol/week: 0.0 standard drinks    Comment: "used to drink; stopped ~ 2008"  . Drug use: No         OPHTHALMIC EXAM:  Base Eye Exam    Visual Acuity (Snellen - Linear)      Right Left   Dist cc 20/40 -1 20/40   Dist ph cc 20/30 NI   Correction: Glasses       Tonometry (Tonopen, 3:00 PM)      Right Left   Pressure 21 17       Pupils      Dark Light Shape React APD   Right 3 2 Round Minimal None   Left 3 2 Round Minimal None       Visual Fields (Counting fingers)      Left Right    Full Full       Extraocular Movement      Right Left    Full Full       Neuro/Psych    Oriented x3: Yes   Mood/Affect: Normal       Dilation    Both eyes: 1.0% Mydriacyl, 2.5% Phenylephrine @ 3:00 PM        Slit Lamp and Fundus Exam     Slit Lamp Exam      Right Left   Lids/Lashes Dermato, mild MGD Dermato, mild MGD   Conjunctiva/Sclera Temporal pinguecula Temporal pinguecula   Cornea EBMD, mild haze, 1-2+ PEE, mild Debris in tear film EBMD, mild haze, trace PEE, well healed temporal cataract wounds, arcus   Anterior Chamber Deep and clear; narrow temporal angle Deep and clear   Iris Round and dilated, mild anterior bowing, No NVI Round and moderately dilated to 5.60mm   Lens 3+ NS with brunesence, 2-3+ cortical PCIOL, trace PCO - non central   Vitreous Synerisis Synerisis       Fundus Exam      Right Left   Disc Pink, sharp, +cupping, central pallor, Thin inferior rim Pink and sharp, +cupping, +PPA   C/D Ratio 0.7 0.6   Macula Flat, blunted foveal reflex, trace central cystic changes - slightly increased, RPE mottling and clumping, no heme Flat, blunted foveal reflex, clusters of fine MA nasal and superior to fovea -- persistent, +cystic changes -- persistent, focal exudates superior to fovea - improving    Vessels attenuated, Tortuous Attenuated, mild av crossing changes, mild tortuousity   Periphery Attached, Focal dot heme below nerve (0600) - improved, rare MA Attached.  No heme.        Refraction    Wearing Rx      Sphere Cylinder Axis Add   Right -0.75 +3.50 163 +2.50   Left -1.00 +1.50 015 +2.50  IMAGING AND PROCEDURES  Imaging and Procedures for 12/30/2020  OCT, Retina - OU - Both Eyes       Right Eye Quality was borderline. Central Foveal Thickness: 308. Progression has worsened. Findings include abnormal foveal contour, no SRF, vitreomacular adhesion , intraretinal fluid (Mild interval increase in IRF / cystic changes superior fovea ).   Left Eye Quality was good. Central Foveal Thickness: 394. Progression has improved. Findings include abnormal foveal contour, intraretinal fluid, intraretinal hyper-reflective material (interval improvement in IRF/IRHM; partial PVD).   Notes *Images  captured and stored on drive  Diagnosis / Impression:  Mild DME OU (OS > OD) OD: Persistent cystic changes superior fovea -- slightly increased OS: Mild interval improvement in IRF/cystic changes; partial PVD  Clinical management:  See below  Abbreviations: NFP - Normal foveal profile. CME - cystoid macular edema. PED - pigment epithelial detachment. IRF - intraretinal fluid. SRF - subretinal fluid. EZ - ellipsoid zone. ERM - epiretinal membrane. ORA - outer retinal atrophy. ORT - outer retinal tubulation. SRHM - subretinal hyper-reflective material. IRHM - intraretinal hyper-reflective material       Intravitreal Injection, Pharmacologic Agent - OS - Left Eye       Time Out 12/30/2020. 3:13 PM. Confirmed correct patient, procedure, site, and patient consented.   Anesthesia Topical anesthesia was used. Anesthetic medications included Proparacaine 0.5%, Lidocaine 2%.   Procedure Preparation included 5% betadine to ocular surface, eyelid speculum. A (32g) needle was used.   Injection:  2 mg aflibercept Alfonse Flavors) SOLN   NDC: A3590391, Lot: [id number], Expiration date: 07/14/2021   Route: Intravitreal, Site: Left Eye, Waste: 0.05 mL  Post-op Post injection exam found visual acuity of at least counting fingers. The patient tolerated the procedure well. There were no complications. The patient received written and verbal post procedure care education. Post injection medications were not given.                 ASSESSMENT/PLAN:    ICD-10-CM   1. 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, associated with type 2 diabetes mellitus (HCC)  B71.6967 Intravitreal Injection, Pharmacologic Agent - OS - Left Eye    aflibercept (EYLEA) SOLN 2 mg  2. Retinal edema  H35.81 OCT, Retina - OU - Both Eyes  3. Essential hypertension  I10   4. Hypertensive retinopathy of both eyes  H35.033   5. Combined forms of age-related cataract of right eye   H25.811   6. 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 History of herpes zoster of eye  Z86.19   9. Primary open angle glaucoma of both eyes, unspecified glaucoma stage  H40.1130     1,2. 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 Single focal MA superior to fovea.                                  OS: Perifoveal Mas w/late leakage - OCT shows diabetic macular edema OU (OS>OD) - s/p IVA OS # 1(07.26.21), #2 (08.24.21), #3 (09.21.21), #4 (10.22.21) -- IVA resistance - IVE OS #1 (11.22.21), #2 (12.21.21), #3 (01.20.22), #4 (02.18.22) - BCVA OS stable at 20/40  - OCT shows mild interval improvement in IRF/cystic changes OS  - recommend IVE OS #5 today, 03.18.22 - pt wishes to proceed with injection - RBA of procedure  discussed, questions answered - IVA informed consent obtained and signed, 07.26.21 (OS) - IVE informed consent obtained and signed, 11.22.21 (OS) - see procedure note - Eylea4U benefits investigation started, 09.21.21 -- approved as of 11.22.21 - f/u in 4 wks -- DFE/OCT, possible injection  3,4. Hypertensive retinopathy OU - discussed importance of tight BP control - monitor  5. Mixed Cataract OD - The symptoms of cataract, surgical options, and treatments and risks were discussed with patient. - discussed diagnosis and progression - under the expert management of Dr. Kathlen Mody  6. Pseudophakia OS  - s/p CE/IOL OS (2020, Dr. Kathlen Mody)  - IOL in good position, doing well  - monitor  7. 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 Roby Lofts at [facility], who recommended SuperK prior to [place]  8. History of herpes zoster/iritis OS  - on valtrex 1g daily -- maintenance    9. POAG OU  - under the expert management of Dr. Kathlen Mody  - IOP 21,17 today  - currently on Cosopt to BID OU   Ophthalmic Meds Ordered this visit:  Meds ordered this  encounter  Medications  . aflibercept (EYLEA) SOLN 2 mg      Return in about 4 weeks (around 01/27/2021) for DME -- , Dilated Exam, OCT, Possible Injxn(s).  There are no Patient Instructions on file for this visit.  This document serves as a record of services personally performed by Gardiner Sleeper, MD, PhD. It was created on their behalf by San Jetty. Owens Shark, OA an ophthalmic technician. The creation of this record is the provider's dictation and/or activities during the visit.    Electronically signed by: San Jetty. Owens Shark, [place] 03.16.2022 5:58 PM  Gardiner Sleeper, M.D., Ph.D. Diseases & Surgery of the Retina and Vitreous [facility]  I have reviewed the above documentation for accuracy and completeness, and I agree with the above. Gardiner Sleeper, M.D., Ph.D. 12/30/20 5:59 PM  Abbreviations: M myopia (nearsighted); A astigmatism; H hyperopia (farsighted); P presbyopia; Mrx spectacle prescription;  CTL contact lenses; OD right eye; OS left eye; OU both eyes  XT exotropia; ET esotropia; PEK punctate epithelial keratitis; PEE punctate epithelial erosions; DES dry eye syndrome; MGD meibomian gland dysfunction; ATs artificial tears; PFAT's preservative free artificial tears; [place] nuclear sclerotic cataract; PSC posterior subcapsular cataract; ERM epi-retinal membrane; PVD posterior vitreous detachment; RD retinal detachment; DM diabetes mellitus; DR diabetic retinopathy; NPDR non-proliferative diabetic retinopathy; PDR proliferative diabetic retinopathy; CSME clinically significant macular edema; DME diabetic macular edema; dbh dot blot hemorrhages; CWS cotton wool spot; POAG primary open angle glaucoma; C/D cup-to-disc ratio; HVF humphrey visual field; GVF goldmann visual field; OCT optical coherence tomography; IOP intraocular pressure; BRVO Branch retinal vein occlusion; CRVO central retinal vein occlusion; CRAO central retinal artery occlusion; BRAO branch retinal artery  occlusion; RT retinal tear; SB scleral buckle; PPV pars plana vitrectomy; VH Vitreous hemorrhage; PRP panretinal laser photocoagulation; IVK intravitreal kenalog; VMT vitreomacular traction; MH Macular hole;  NVD neovascularization of the disc; NVE neovascularization elsewhere; AREDS age related eye disease study; ARMD age related macular degeneration; POAG primary open angle glaucoma; EBMD epithelial/anterior basement membrane dystrophy; ACIOL anterior chamber intraocular lens; IOL intraocular lens; PCIOL posterior chamber intraocular lens; Phaco/IOL phacoemulsification with intraocular lens placement; [place] photorefractive keratectomy; LASIK laser assisted in situ keratomileusis; HTN hypertension; DM diabetes mellitus; COPD chronic obstructive pulmonary disease

## 2020-12-30 ENCOUNTER — Other Ambulatory Visit: Payer: Self-pay

## 2020-12-30 ENCOUNTER — Encounter (INDEPENDENT_AMBULATORY_CARE_PROVIDER_SITE_OTHER): Payer: Self-pay | Admitting: Ophthalmology

## 2020-12-30 ENCOUNTER — Ambulatory Visit (INDEPENDENT_AMBULATORY_CARE_PROVIDER_SITE_OTHER): Payer: Medicare Other | Admitting: Ophthalmology

## 2020-12-30 DIAGNOSIS — Z8619 Personal history of other infectious and parasitic diseases: Secondary | ICD-10-CM

## 2020-12-30 DIAGNOSIS — E113213 Type 2 diabetes mellitus with mild nonproliferative diabetic retinopathy with macular edema, bilateral: Secondary | ICD-10-CM

## 2020-12-30 DIAGNOSIS — H401130 Primary open-angle glaucoma, bilateral, stage unspecified: Secondary | ICD-10-CM

## 2020-12-30 DIAGNOSIS — H3581 Retinal edema: Secondary | ICD-10-CM

## 2020-12-30 DIAGNOSIS — I10 Essential (primary) hypertension: Secondary | ICD-10-CM

## 2020-12-30 DIAGNOSIS — H18523 Epithelial (juvenile) corneal dystrophy, bilateral: Secondary | ICD-10-CM

## 2020-12-30 DIAGNOSIS — H35033 Hypertensive retinopathy, bilateral: Secondary | ICD-10-CM | POA: Diagnosis not present

## 2020-12-30 DIAGNOSIS — H25811 Combined forms of age-related cataract, right eye: Secondary | ICD-10-CM

## 2020-12-30 DIAGNOSIS — Z961 Presence of intraocular lens: Secondary | ICD-10-CM

## 2020-12-30 MED ORDER — AFLIBERCEPT 2MG/0.05ML IZ SOLN FOR KALEIDOSCOPE
2.0000 mg | INTRAVITREAL | Status: AC | PRN
  Administered 2020-12-30: 2 mg via INTRAVITREAL

## 2021-01-25 NOTE — Progress Notes (Addendum)
[facility] Note  01/27/2021     CHIEF COMPLAINT Patient presents for Retina Follow Up   HISTORY OF PRESENT ILLNESS: Lucas Dudley is a 80 y.o. male who presents to the clinic today for:   HPI    Retina Follow Up    Patient presents with  Diabetic Retinopathy.  In both eyes.  This started months ago.  Severity is moderate.  Duration of 4 weeks.  Since onset it is stable.  I, the attending physician,  performed the HPI with the patient and updated documentation appropriately.          Comments    80 y/o male pt here for 4 wk f/u for NPDR OU.  No change in [place] OU.  Denies pain, FOL, floaters.  Cosopt BID OU.  BS 149 this a.m.  A1C unknown.       Last edited by Bernarda Caffey, MD on 01/29/2021 12:40 AM. (History)    pt states no change in vision   Referring physician: Hortencia Pilar, MD [place],  [place] [id number]  HISTORICAL INFORMATION:   Selected notes from the MEDICAL RECORD NUMBER Referred by Dr. Kathlen Mody for DEE   CURRENT MEDICATIONS: Current Outpatient Medications (Ophthalmic Drugs)  Medication Sig  . Carboxymethylcellul-Glycerin (LUBRICATING EYE DROPS OP) Apply 1 drop to eye daily as needed (itching eyes).  . dorzolamide-timolol (COSOPT) 22.3-6.8 MG/ML ophthalmic solution Place 1 drop into both eyes 2 (two) times daily.  [name] latanoprost (XALATAN) 0.005 % ophthalmic solution Place 1 drop into both eyes at bedtime.    No current facility-administered medications for this visit. (Ophthalmic Drugs)   Current Outpatient Medications (Other)  Medication Sig  . amLODipine (NORVASC) 10 MG tablet TAKE 1 TABLET BY MOUTH EVERY DAY  . amLODipine (NORVASC) 5 MG tablet Take 5 mg by mouth daily.  . carvedilol (COREG) 6.25 MG tablet Takes 1/2 tablet by mouth in the AM and 1 tablet by mouth in the PM  . ELDERBERRY PO Take by mouth daily.  [name] ELIQUIS 5 MG TABS tablet TAKE 1 TABLET TWICE A DAY  . furosemide (LASIX) 40 MG tablet TAKE  1 TABLET BY MOUTH EVERY DAY  . gemfibrozil (LOPID) 600 MG tablet Take 600 mg by mouth at bedtime.   [name] glimepiride (AMARYL) 2 MG tablet Take 2 mg by mouth 2 (two) times daily.   . Ibuprofen-diphenhydrAMINE Cit (ADVIL PM PO) Take by mouth at bedtime.  . Insulin Glargine (BASAGLAR KWIKPEN) 100 UNIT/ML SOPN Inject 40 Units into the skin at bedtime.   [name] levofloxacin (LEVAQUIN) 500 MG tablet Take 500 mg by mouth daily.  [name] lisinopril (ZESTRIL) 40 MG tablet TAKE 1 TABLET BY MOUTH EVERY DAY  . MAGNESIUM PO Take by mouth.  . MELATONIN PO Take by mouth.  . metFORMIN (GLUCOPHAGE-XR) 500 MG 24 hr tablet Take 1,000 mg by mouth 2 (two) times daily.  . Omega-3 Fatty Acids (FISH OIL PO) Take by mouth daily.  . valACYclovir (VALTREX) 1000 MG tablet Take 1,000 mg by mouth daily.    No current facility-administered medications for this visit. (Other)   REVIEW OF SYSTEMS: ROS    Positive for: Gastrointestinal, Endocrine, Cardiovascular, Eyes, Respiratory   Negative for: Constitutional, Neurological, Skin, Genitourinary, Musculoskeletal, HENT, Psychiatric, Allergic/Imm, Heme/Lymph   Last edited by Matthew Folks, COA on 01/27/2021  1:55 PM. (History)     ALLERGIES Allergies  Allergen Reactions  . Tape Rash and Other (See Comments)  Causes skin redness, Use paper tape only.   PAST MEDICAL HISTORY Past Medical History:  Diagnosis Date  . Arthritis    "hands probably" (05/04/2015)  . CAD (coronary artery disease)    a. Cath 03/17/15 showing 100% ostial D1, 50% prox LAD to mid LAD, 40% RPDA stenosis. Med rx. // Myoview 01/2020: EF 31 diffuse perfusion defect without reversibility (suspect artifact); reviewed with Dr. Aggie Cosier study felt to be low risk  . Cataract    Mixed form OD  . Chronic diastolic CHF (congestive heart failure) (HCC)    Echocardiogram 01/2020: EF 55-60, no RWMA, moderate LVH, GR 1 DD, normal RV SF, RVSP 28.2, mild MR, mild aortic valve sclerosis (no aortic stenosis)  . 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 Glaucoma    POAG OU  . History of gout   . Hyperlipidemia   . Hypertension   . Hypertensive retinopathy    OU  . Kidney stones    "found during prostate OR; still in there as far as I know" (05/04/2015)  . Melanoma of neck ([place])    "right; came back IV; no chemo"  . NICM (nonischemic cardiomyopathy) ([place])   . OSA on CPAP 2012  . Paroxysmal atrial fibrillation (HCC)    a. 04/2015 Tikosyn loading, DCCV x 1;  b. CHA2DS2VASc = 5-->Eliquis.  . Prostate cancer ([place])   . Type II diabetes mellitus ([place])    Past Surgical History:  Procedure Laterality Date  . ABDOMINAL HERNIA REPAIR     w/mesh  . CARDIAC CATHETERIZATION N/A 03/17/2015   Procedure: Left Heart Cath and Coronary Angiography;  Surgeon: Lorretta Harp, MD;  Location: [facility];  Service: Cardiovascular;  Laterality: N/A;  . carotid doppler  09/17/2008   rigt and left ICAs 0-49%;mildly  abnormal  . CATARACT EXTRACTION Left 2020   Dr. Kathlen Mody  . COLONOSCOPY N/A 05/16/2017   Procedure: COLONOSCOPY;  Surgeon: Rogene Houston, MD;  [facility];  Service: Endoscopy;  Laterality: N/A;  930  . DOPPLER ECHOCARDIOGRAPHY  05/25/2009   EF 50-55%,LA mildly dilated, LV function normal  . ELECTROPHYSIOLOGIC STUDY N/A 04/05/2015   Procedure: Cardioversion;  Surgeon: Sanda Klein, MD;  Location: [facility];  Service: Cardiovascular;  Laterality: N/A;  . ELECTROPHYSIOLOGIC STUDY N/A 09/06/2015   Procedure: Atrial Fibrillation Ablation;  Surgeon: Thompson Grayer, MD;  Location: [facility];  Service: Cardiovascular;  Laterality: N/A;  . ELECTROPHYSIOLOGIC STUDY N/A 07/12/2016   redo afib ablation by Dr Rayann Heman  . EXCISIONAL HEMORRHOIDECTOMY     "inside and out"  . EYE SURGERY Left 2020   Cat Sx - Dr. Kathlen Mody  . FINE NEEDLE ASPIRATION Right    knee; "drew ~ 1 quart off"  . HERNIA REPAIR    . LAPAROSCOPIC CHOLECYSTECTOMY    . MELANOMA EXCISION Right    "neck"  . NM MYOCAR PERF  WALL MOTION  02/21/2012   EF 61% ,EXERCISE 7 METS. exercise stopped due to wheezing and shortness of breathe  . POLYPECTOMY  05/16/2017   Procedure: POLYPECTOMY;  Surgeon: Rogene Houston, MD;  [facility];  Service: Endoscopy;;  colon  . PROSTATECTOMY    . SHOULDER OPEN ROTATOR CUFF REPAIR Right X 2  . TEE WITHOUT CARDIOVERSION N/A 09/05/2015   Procedure: TRANSESOPHAGEAL ECHOCARDIOGRAM (TEE);  Surgeon: Sueanne Margarita, MD;  Location: [facility];  Service: Cardiovascular;  Laterality: N/A;   FAMILY HISTORY Family History  Problem Relation  Age of Onset  . Heart disease Mother   . Lung cancer Mother   . Heart attack Mother 53  . Stroke Brother   . Healthy Daughter   . Diabetes Father   . Heart disease Father    SOCIAL HISTORY Social History   Tobacco Use  . Smoking status: Former Smoker    Packs/day: 2.00    Years: 27.00    Pack years: 54.00    Types: Cigarettes    Quit date: 10/31/1983    Years since quitting: 37.2  . Smokeless tobacco: Never Used  Substance Use Topics  . Alcohol use: No    Alcohol/week: 0.0 standard drinks    Comment: "used to drink; stopped ~ 2008"  . Drug use: No         OPHTHALMIC EXAM:  Base Eye Exam    Visual Acuity (Snellen - Linear)      Right Left   Dist cc 20/40 -2 20/40   Dist ph cc 20/40 + 20/30 -2   Correction: Glasses       Tonometry (Tonopen, 1:56 PM)      Right Left   Pressure 20 18       Pupils      Dark Light Shape React APD   Right 3 2 Round Minimal None   Left 3 2 Round Minimal None       Visual Fields (Counting fingers)      Left Right    Full Full       Extraocular Movement      Right Left    Full, Ortho Full, Ortho       Neuro/Psych    Oriented x3: Yes   Mood/Affect: Normal       Dilation    Both eyes: 1.0% Mydriacyl, 2.5% Phenylephrine @ 1:56 PM        Slit Lamp and Fundus Exam    Slit Lamp Exam      Right Left   Lids/Lashes Dermato, mild MGD Dermato, mild MGD   Conjunctiva/Sclera  Temporal pinguecula Temporal pinguecula   Cornea EBMD, mild haze, 1-2+ PEE, mild Debris in tear film EBMD, mild haze, trace PEE, well healed temporal cataract wounds, arcus   Anterior Chamber Deep and clear; narrow temporal angle Deep and clear   Iris Round and dilated, mild anterior bowing, No NVI Round and moderately dilated to 5.40mm   Lens 3+ NS with brunesence, 2-3+ cortical PCIOL, trace PCO - non central   Vitreous Synerisis Synerisis       Fundus Exam      Right Left   Disc Pink, sharp, +cupping, central pallor, Thin inferior rim Pink and sharp, +cupping, +PPA   C/D Ratio 0.7 0.6   Macula Flat, blunted foveal reflex, trace central cystic changes - slightly improved, RPE mottling and clumping, no heme Flat, blunted foveal reflex, clusters of fine MA nasal and superior to fovea -- improving, +cystic changes -- persistent, focal exudates superior to fovea - improving   Vessels attenuated, Tortuous Attenuated, mild av crossing changes, mild tortuousity   Periphery Attached, Focal dot heme below nerve (0600) - improved, rare MA Attached.  No heme.          IMAGING AND PROCEDURES  Imaging and Procedures for 01/27/2021  OCT, Retina - OU - Both Eyes       Right Eye Quality was borderline. Central Foveal Thickness: 310. Progression has improved. Findings include abnormal foveal contour, no SRF, vitreomacular adhesion , intraretinal fluid (Persistent RF /  cystic changes superior fovea -- slightly improved).   Left Eye Quality was good. Central Foveal Thickness: 419. Progression has worsened. Findings include abnormal foveal contour, intraretinal fluid, intraretinal hyper-reflective material (persistent IRF increased centrally; partial PVD).   Notes *Images captured and stored on drive  Diagnosis / Impression:  Mild DME OU (OS > OD) OD: Persistent cystic changes superior fovea -- slightly improved OS: Mild interval increase in IRF/cystic changes; partial PVD  Clinical management:   See below  Abbreviations: NFP - Normal foveal profile. CME - cystoid macular edema. PED - pigment epithelial detachment. IRF - intraretinal fluid. SRF - subretinal fluid. EZ - ellipsoid zone. ERM - epiretinal membrane. ORA - outer retinal atrophy. ORT - outer retinal tubulation. SRHM - subretinal hyper-reflective material. IRHM - intraretinal hyper-reflective material       Intravitreal Injection, Pharmacologic Agent - OS - Left Eye       Time Out 01/27/2021. 2:29 PM. Confirmed correct patient, procedure, site, and patient consented.   Anesthesia Topical anesthesia was used. Anesthetic medications included Proparacaine 0.5%, Lidocaine 2%.   Procedure Preparation included 5% betadine to ocular surface, eyelid speculum. A (32g) needle was used.   Injection:  2 mg aflibercept Alfonse Flavors) SOLN   NDC: A3590391, Lot: [id number], Expiration date: 10/13/2021   Route: Intravitreal, Site: Left Eye, Waste: 0.05 mL  Post-op Post injection exam found visual acuity of at least counting fingers. The patient tolerated the procedure well. There were no complications. The patient received written and verbal post procedure care education. Post injection medications were not given.                 ASSESSMENT/PLAN:    ICD-10-CM   1. 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, associated with type 2 diabetes mellitus (HCC)  W97.9892 Intravitreal Injection, Pharmacologic Agent - OS - Left Eye    aflibercept (EYLEA) SOLN 2 mg  2. Retinal edema  H35.81 OCT, Retina - OU - Both Eyes  3. Essential hypertension  I10   4. Hypertensive retinopathy of both eyes  H35.033   5. Combined forms of age-related cataract of right eye  H25.811   6. 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 History of herpes zoster of eye  Z86.19   9. Primary open angle glaucoma of both eyes, unspecified glaucoma stage  H40.1130      1,2. 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 Single focal MA superior to fovea.                                  OS: Perifoveal Mas w/late leakage - OCT shows diabetic macular edema OU (OS>OD) - s/p IVA OS # 1(07.26.21), #2 (08.24.21), #3 (09.21.21), #4 (10.22.21) -- IVA resistance - IVE OS #1 (11.22.21), #2 (12.21.21), #3 (01.20.22), #4 (02.18.22), #5 (03.18.22) - BCVA OS improved to 20/30 from 20/40 - OCT shows mild interval increase in IRF/cystic changes OS  - recommend IVE OS #6 today, 04.15.22 - pt wishes to proceed with injection - RBA of procedure discussed, questions answered - IVA informed consent obtained and signed, 07.26.21 (OS) - IVE informed consent obtained and signed, 11.22.21 (OS) - see procedure note - Eylea4U benefits investigation started, 09.21.21 -- approved as of 11.22.21 - f/u in 4 wks -- DFE/OCT, possible injection  3,4. Hypertensive  retinopathy OU - discussed importance of tight BP control - monitor  5. Mixed Cataract OD - The symptoms of cataract, surgical options, and treatments and risks were discussed with patient. - discussed diagnosis and progression - under the expert management of Dr. Kathlen Mody  6. Pseudophakia OS  - s/p CE/IOL OS (2020, Dr. Kathlen Mody)  - IOL in good position, doing well  - monitor  7. 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 Roby Lofts at [facility], who recommended SuperK prior to [place]  8. History of herpes zoster/iritis OS  - on valtrex 1g daily -- maintenance    9. POAG OU  - under the expert management of Dr. Kathlen Mody  - IOP 20,18 today  - currently on Cosopt to BID OU   Ophthalmic Meds Ordered this visit:  Meds ordered this encounter  Medications  . aflibercept (EYLEA) SOLN 2 mg      Return in about 4 weeks (around 02/24/2021) for f/u NPDR OU, DFE, OCT.  There are no Patient Instructions on file for this visit.  This document  serves as a record of services personally performed by Gardiner Sleeper, MD, PhD. It was created on their behalf by San Jetty. Owens Shark, OA an ophthalmic technician. The creation of this record is the provider's dictation and/or activities during the visit.    Electronically signed by: San Jetty. Owens Shark, [place] 03.16.2022 12:44 AM  Gardiner Sleeper, M.D., Ph.D. Diseases & Surgery of the Retina and Vitreous [facility]  I have reviewed the above documentation for accuracy and completeness, and I agree with the above. Gardiner Sleeper, M.D., Ph.D. 01/29/21 12:44 AM   Abbreviations: M myopia (nearsighted); A astigmatism; H hyperopia (farsighted); P presbyopia; Mrx spectacle prescription;  CTL contact lenses; OD right eye; OS left eye; OU both eyes  XT exotropia; ET esotropia; PEK punctate epithelial keratitis; PEE punctate epithelial erosions; DES dry eye syndrome; MGD meibomian gland dysfunction; ATs artificial tears; PFAT's preservative free artificial tears; [place] nuclear sclerotic cataract; PSC posterior subcapsular cataract; ERM epi-retinal membrane; PVD posterior vitreous detachment; RD retinal detachment; DM diabetes mellitus; DR diabetic retinopathy; NPDR non-proliferative diabetic retinopathy; PDR proliferative diabetic retinopathy; CSME clinically significant macular edema; DME diabetic macular edema; dbh dot blot hemorrhages; CWS cotton wool spot; POAG primary open angle glaucoma; C/D cup-to-disc ratio; HVF humphrey visual field; GVF goldmann visual field; OCT optical coherence tomography; IOP intraocular pressure; BRVO Branch retinal vein occlusion; CRVO central retinal vein occlusion; CRAO central retinal artery occlusion; BRAO branch retinal artery occlusion; RT retinal tear; SB scleral buckle; PPV pars plana vitrectomy; VH Vitreous hemorrhage; PRP panretinal laser photocoagulation; IVK intravitreal kenalog; VMT vitreomacular traction; MH Macular hole;  NVD neovascularization of the  disc; NVE neovascularization elsewhere; AREDS age related eye disease study; ARMD age related macular degeneration; POAG primary open angle glaucoma; EBMD epithelial/anterior basement membrane dystrophy; ACIOL anterior chamber intraocular lens; IOL intraocular lens; PCIOL posterior chamber intraocular lens; Phaco/IOL phacoemulsification with intraocular lens placement; [place] photorefractive keratectomy; LASIK laser assisted in situ keratomileusis; HTN hypertension; DM diabetes mellitus; COPD chronic obstructive pulmonary disease

## 2021-01-27 ENCOUNTER — Encounter (INDEPENDENT_AMBULATORY_CARE_PROVIDER_SITE_OTHER): Payer: Self-pay | Admitting: Ophthalmology

## 2021-01-27 ENCOUNTER — Other Ambulatory Visit: Payer: Self-pay

## 2021-01-27 ENCOUNTER — Ambulatory Visit (INDEPENDENT_AMBULATORY_CARE_PROVIDER_SITE_OTHER): Payer: Medicare Other | Admitting: Ophthalmology

## 2021-01-27 DIAGNOSIS — Z961 Presence of intraocular lens: Secondary | ICD-10-CM

## 2021-01-27 DIAGNOSIS — Z8619 Personal history of other infectious and parasitic diseases: Secondary | ICD-10-CM

## 2021-01-27 DIAGNOSIS — H35033 Hypertensive retinopathy, bilateral: Secondary | ICD-10-CM | POA: Diagnosis not present

## 2021-01-27 DIAGNOSIS — E113213 Type 2 diabetes mellitus with mild nonproliferative diabetic retinopathy with macular edema, bilateral: Secondary | ICD-10-CM | POA: Diagnosis not present

## 2021-01-27 DIAGNOSIS — H3581 Retinal edema: Secondary | ICD-10-CM

## 2021-01-27 DIAGNOSIS — I10 Essential (primary) hypertension: Secondary | ICD-10-CM | POA: Diagnosis not present

## 2021-01-27 DIAGNOSIS — H18523 Epithelial (juvenile) corneal dystrophy, bilateral: Secondary | ICD-10-CM

## 2021-01-27 DIAGNOSIS — H401130 Primary open-angle glaucoma, bilateral, stage unspecified: Secondary | ICD-10-CM

## 2021-01-27 DIAGNOSIS — H25811 Combined forms of age-related cataract, right eye: Secondary | ICD-10-CM

## 2021-01-29 ENCOUNTER — Encounter (INDEPENDENT_AMBULATORY_CARE_PROVIDER_SITE_OTHER): Payer: Self-pay | Admitting: Ophthalmology

## 2021-01-29 ENCOUNTER — Other Ambulatory Visit: Payer: Self-pay | Admitting: Physician Assistant

## 2021-01-29 DIAGNOSIS — E113213 Type 2 diabetes mellitus with mild nonproliferative diabetic retinopathy with macular edema, bilateral: Secondary | ICD-10-CM

## 2021-01-29 MED ORDER — AFLIBERCEPT 2MG/0.05ML IZ SOLN FOR KALEIDOSCOPE
2.0000 mg | INTRAVITREAL | Status: AC | PRN
Start: 2021-01-29 — End: 2021-01-29
  Administered 2021-01-29: 2 mg via INTRAVITREAL

## 2021-02-02 ENCOUNTER — Ambulatory Visit (HOSPITAL_COMMUNITY)
Admission: RE | Admit: 2021-02-02 | Discharge: 2021-02-02 | Disposition: A | Discharge status: Home / Self Care | Payer: Medicare Other | Source: Ambulatory Visit | Attending: Nurse Practitioner | Admitting: Nurse Practitioner

## 2021-02-02 ENCOUNTER — Other Ambulatory Visit: Payer: Self-pay

## 2021-02-02 ENCOUNTER — Encounter (HOSPITAL_COMMUNITY): Payer: Self-pay | Admitting: Nurse Practitioner

## 2021-02-02 VITALS — BP 134/82 | HR 71 | Ht 70.0 in | Wt 242.0 lb

## 2021-02-02 DIAGNOSIS — Z7901 Long term (current) use of anticoagulants: Secondary | ICD-10-CM | POA: Diagnosis not present

## 2021-02-02 DIAGNOSIS — Z9049 Acquired absence of other specified parts of digestive tract: Secondary | ICD-10-CM | POA: Insufficient documentation

## 2021-02-02 DIAGNOSIS — M7989 Other specified soft tissue disorders: Secondary | ICD-10-CM | POA: Insufficient documentation

## 2021-02-02 DIAGNOSIS — Z8249 Family history of ischemic heart disease and other diseases of the circulatory system: Secondary | ICD-10-CM | POA: Insufficient documentation

## 2021-02-02 DIAGNOSIS — Z888 Allergy status to other drugs, medicaments and biological substances status: Secondary | ICD-10-CM | POA: Diagnosis not present

## 2021-02-02 DIAGNOSIS — D6869 Other thrombophilia: Secondary | ICD-10-CM

## 2021-02-02 DIAGNOSIS — G4733 Obstructive sleep apnea (adult) (pediatric): Secondary | ICD-10-CM | POA: Insufficient documentation

## 2021-02-02 DIAGNOSIS — I110 Hypertensive heart disease with heart failure: Secondary | ICD-10-CM | POA: Insufficient documentation

## 2021-02-02 DIAGNOSIS — I4819 Other persistent atrial fibrillation: Secondary | ICD-10-CM | POA: Diagnosis not present

## 2021-02-02 DIAGNOSIS — Z87891 Personal history of nicotine dependence: Secondary | ICD-10-CM | POA: Diagnosis not present

## 2021-02-02 DIAGNOSIS — Z9079 Acquired absence of other genital organ(s): Secondary | ICD-10-CM | POA: Diagnosis not present

## 2021-02-02 DIAGNOSIS — I480 Paroxysmal atrial fibrillation: Secondary | ICD-10-CM

## 2021-02-02 DIAGNOSIS — Z79899 Other long term (current) drug therapy: Secondary | ICD-10-CM | POA: Insufficient documentation

## 2021-02-02 LAB — COMPREHENSIVE METABOLIC PANEL
ALT: 13 U/L (ref 0–44)
AST: 16 U/L (ref 15–41)
Albumin: 3.7 g/dL (ref 3.5–5.0)
Alkaline Phosphatase: 50 U/L (ref 38–126)
Anion gap: 8 (ref 5–15)
BUN: 20 mg/dL (ref 8–23)
CO2: 29 mmol/L (ref 22–32)
Calcium: 8.9 mg/dL (ref 8.9–10.3)
Chloride: 100 mmol/L (ref 98–111)
Creatinine, Ser: 1.12 mg/dL (ref 0.61–1.24)
GFR, Estimated: >60 mL/min (ref >60)
Glucose, Bld: 197 mg/dL — ABNORMAL HIGH (ref 70–99)
Potassium: 3.7 mmol/L (ref 3.5–5.1)
Sodium: 137 mmol/L (ref 135–145)
Total Bilirubin: 0.7 mg/dL (ref 0.3–1.2)
Total Protein: 7 g/dL (ref 6.5–8.1)

## 2021-02-02 LAB — CBC
HCT: 38.7 % — ABNORMAL LOW (ref 39.0–52.0)
Hemoglobin: 12.9 g/dL — ABNORMAL LOW (ref 13.0–17.0)
MCH: 31.1 pg (ref 26.0–34.0)
MCHC: 33.3 g/dL (ref 30.0–36.0)
MCV: 93.3 fL (ref 80.0–100.0)
Platelets: 241 10*3/uL (ref 150–400)
RBC: 4.15 MIL/uL — ABNORMAL LOW (ref 4.22–5.81)
RDW: 13.1 % (ref 11.5–15.5)
WBC: 6.8 10*3/uL (ref 4.0–10.5)
nRBC: 0 % (ref 0.0–0.2)

## 2021-02-02 NOTE — Progress Notes (Signed)
Primary Care Physician: [name], MD Primary Cardiologist: Gwenlyn Found Primary Electrophysiologist: Genevie Ann is a 80 y.o. male with a history of persistent atrial fibrillation who presents for follow up in the [facility].  Since last being seen in clinic, the patient reports doing very well. He is unaware of being in afib today, rate controlled. He feels well.  No bleeding issues on [place].[name] He has chronic LLE, which he feels is at his baseline . BP stable   Today, he  denies symptoms of palpitations, chest pain, shortness of breath, orthopnea, PND, lower extremity edema, dizziness, presyncope, syncope, snoring, daytime somnolence, bleeding, or neurologic sequela. The patient is tolerating medications without difficulties and is otherwise without complaint today.    Atrial Fibrillation Risk Factors:  he does have symptoms or diagnosis of sleep apnea. he is compliant with CPAP therapy.  he does not have a history of rheumatic fever.  he does not have a history of alcohol use.  he has a BMI of Body mass index is 34.72 kg/m.[name] Filed Weights   02/02/21 1347  Weight: 109.8 kg    LA size: 40   Atrial Fibrillation Management history:  Previous antiarrhythmic drugs: Tikosyn  Previous cardioversions: 2016  Previous ablations: 2016  CHADS2VASC score: 5  Anticoagulation history: Eliquis   Past Medical History:  Diagnosis Date  . Arthritis    "hands probably" (05/04/2015)  . CAD (coronary artery disease)    a. Cath 03/17/15 showing 100% ostial D1, 50% prox LAD to mid LAD, 40% RPDA stenosis. Med rx. // Myoview 01/2020: EF 31 diffuse perfusion defect without reversibility (suspect artifact); reviewed with Dr. Aggie Cosier study felt to be low risk  . Cataract    Mixed form OD  . Chronic diastolic CHF (congestive heart failure) (HCC)    Echocardiogram 01/2020: EF 55-60, no RWMA, moderate LVH, GR 1 DD, normal RV SF, RVSP 28.2, mild MR, mild  aortic valve sclerosis (no aortic stenosis)  . Diabetic retinopathy ([place])    NPDR OU  . Glaucoma    POAG OU  . History of gout   . Hyperlipidemia   . Hypertension   . Hypertensive retinopathy    OU  . Kidney stones    "found during prostate OR; still in there as far as I know" (05/04/2015)  . Melanoma of neck ([place])    "right; came back IV; no chemo"  . NICM (nonischemic cardiomyopathy) ([place])   . OSA on CPAP 2012  . Paroxysmal atrial fibrillation (HCC)    a. 04/2015 Tikosyn loading, DCCV x 1;  b. CHA2DS2VASc = 5-->Eliquis.  . Prostate cancer ([place])   . Type II diabetes mellitus ([place])    Past Surgical History:  Procedure Laterality Date  . ABDOMINAL HERNIA REPAIR     w/mesh  . CARDIAC CATHETERIZATION N/A 03/17/2015   Procedure: Left Heart Cath and Coronary Angiography;  Surgeon: Lorretta Harp, MD;  Location: [facility];  Service: Cardiovascular;  Laterality: N/A;  . carotid doppler  09/17/2008   rigt and left ICAs 0-49%;mildly  abnormal  . CATARACT EXTRACTION Left 2020   Dr. Kathlen Mody  . COLONOSCOPY N/A 05/16/2017   Procedure: COLONOSCOPY;  Surgeon: Rogene Houston, MD;  [facility];  Service: Endoscopy;  Laterality: N/A;  930  . DOPPLER ECHOCARDIOGRAPHY  05/25/2009   EF 50-55%,LA mildly dilated, LV function normal  . ELECTROPHYSIOLOGIC STUDY N/A 04/05/2015   Procedure: Cardioversion;  Surgeon: Sanda Klein, MD;  Location: [facility];  Service: Cardiovascular;  Laterality: N/A;  . ELECTROPHYSIOLOGIC STUDY N/A 09/06/2015   Procedure: Atrial Fibrillation Ablation;  Surgeon: Thompson Grayer, MD;  Location: [facility];  Service: Cardiovascular;  Laterality: N/A;  . ELECTROPHYSIOLOGIC STUDY N/A 07/12/2016   redo afib ablation by Dr Rayann Heman  . EXCISIONAL HEMORRHOIDECTOMY     "inside and out"  . EYE SURGERY Left 2020   Cat Sx - Dr. Kathlen Mody  . FINE NEEDLE ASPIRATION Right    knee; "drew ~ 1 quart off"  . HERNIA REPAIR    . LAPAROSCOPIC CHOLECYSTECTOMY     . MELANOMA EXCISION Right    "neck"  . NM MYOCAR PERF WALL MOTION  02/21/2012   EF 61% ,EXERCISE 7 METS. exercise stopped due to wheezing and shortness of breathe  . POLYPECTOMY  05/16/2017   Procedure: POLYPECTOMY;  Surgeon: Rogene Houston, MD;  [facility];  Service: Endoscopy;;  colon  . PROSTATECTOMY    . SHOULDER OPEN ROTATOR CUFF REPAIR Right X 2  . TEE WITHOUT CARDIOVERSION N/A 09/05/2015   Procedure: TRANSESOPHAGEAL ECHOCARDIOGRAM (TEE);  Surgeon: Sueanne Margarita, MD;  Location: [facility];  Service: Cardiovascular;  Laterality: N/A;    Current Outpatient Medications  Medication Sig Dispense Refill  . amLODipine (NORVASC) 5 MG tablet TAKE 1 TABLET BY MOUTH EVERY DAY 90 tablet 2  . APPLE CIDER VINEGAR PO Take by mouth.    . Carboxymethylcellul-Glycerin (LUBRICATING EYE DROPS OP) Apply 1 drop to eye daily as needed (itching eyes).    . carvedilol (COREG) 6.25 MG tablet Takes 1 tablet by mouth in the AM and 1 tablet by mouth in the PM    . dorzolamide-timolol (COSOPT) 22.3-6.8 MG/ML ophthalmic solution Place 1 drop into both eyes 2 (two) times daily. 10 mL 6  . ELDERBERRY PO Take by mouth daily.    [name] ELIQUIS 5 MG TABS tablet TAKE 1 TABLET TWICE A DAY 180 tablet 1  . furosemide (LASIX) 40 MG tablet TAKE 1 TABLET BY MOUTH EVERY DAY 90 tablet 3  . gemfibrozil (LOPID) 600 MG tablet Take 600 mg by mouth at bedtime.     [name] glimepiride (AMARYL) 2 MG tablet Take 2 mg by mouth 2 (two) times daily.     . Ibuprofen-diphenhydrAMINE Cit (ADVIL PM PO) Take by mouth at bedtime.    . Insulin Glargine (BASAGLAR KWIKPEN) 100 UNIT/ML SOPN Inject 40 Units into the skin at bedtime.     [name] lisinopril (ZESTRIL) 40 MG tablet TAKE 1 TABLET BY MOUTH EVERY DAY 90 tablet 3  . MELATONIN PO Take by mouth.    . metFORMIN (GLUCOPHAGE-XR) 500 MG 24 hr tablet Take 1,000 mg by mouth 2 (two) times daily.    . Omega-3 Fatty Acids (FISH OIL PO) Take by mouth daily.    . valACYclovir (VALTREX) 1000 MG  tablet Take 1,000 mg by mouth daily.     [name] zinc gluconate 50 MG tablet Take 50 mg by mouth daily.     No current facility-administered medications for this encounter.    Allergies  Allergen Reactions  . Tape Rash and Other (See Comments)    Causes skin redness, Use paper tape only.    Social History   Socioeconomic History  . Marital status: Married    Spouse name: Not on file  . Number of children: Not on file  . Years of education: Not on file  . Highest education level: Not on file  Occupational  History  . Occupation: Retired  Tobacco Use  . Smoking status: Former Smoker    Packs/day: 2.00    Years: 27.00    Pack years: 54.00    Types: Cigarettes    Quit date: 10/31/1983    Years since quitting: 37.2  . Smokeless tobacco: Never Used  Substance and Sexual Activity  . Alcohol use: No    Alcohol/week: 0.0 standard drinks    Comment: "used to drink; stopped ~ 2008"  . Drug use: No  . Sexual activity: Not Currently  Other Topics Concern  . Not on file  Social History Narrative   Lives in [place], [place] with wife.   Social Determinants of Health   Financial Resource Strain: Not on file  Food Insecurity: Not on file  Transportation Needs: Not on file  Physical Activity: Not on file  Stress: Not on file  Social Connections: Not on file  Intimate Partner Violence: Not on file    Family History  Problem Relation Age of Onset  . Heart disease Mother   . Lung cancer Mother   . Heart attack Mother 18  . Stroke Brother   . Healthy Daughter   . Diabetes Father   . Heart disease Father     ROS- All systems are reviewed and negative except as per the HPI above.  Physical Exam: Vitals:   02/02/21 1347  BP: 134/82  Pulse: 71  Weight: 109.8 kg  Height: 5\' 10"  (1.778 m)    GEN- The patient is well appearing, alert and oriented x 3 today.   Head- normocephalic, atraumatic Eyes-  Sclera clear, conjunctiva pink Ears- hearing intact Oropharynx- clear Neck- supple   Lungs- Clear to ausculation bilaterally, normal work of breathing Heart- irregular rate and rhythm  GI- soft, NT, ND, + BS Extremities- no clubbing, cyanosis, or  2+ edema  MS- no significant deformity or atrophy Skin- no rash or lesion Psych- euthymic mood, full affect Neuro- strength and sensation are intact  Wt Readings from Last 3 Encounters:  02/02/21 109.8 kg  09/21/20 111.1 kg  05/17/20 108.9 kg    EKG today demonstrates  atrial fibrillation at 71 bpm, qrs int 112 ms,  qtc 441 ms    Epic records are reviewed at length today  Assessment and Plan:  1. Persistent atrial fibrillation He has been maintaining  SR but I caught him in afib today with controlled v rates  He feels good, has not noted any difference Continue carvedilol 6.25 mg bid   Continue Eliquis 5 mg bid  for CHADS2VASC of 5 Cbc drawn today   2. HTN Stable   3. Obstructive sleep apnea The patient reports compliance with CPAP   4. Combined systolic /diastolic HF  Continue with current diuretic Pt states current leg swelling is as baseline   Follow up  In 10 days for ekg  to determine if he is still out of rhythm   Roderic Palau, NP 02/02/2021 2:07 PM

## 2021-02-14 ENCOUNTER — Encounter (HOSPITAL_COMMUNITY): Payer: Self-pay

## 2021-02-14 ENCOUNTER — Encounter (HOSPITAL_COMMUNITY): Payer: Medicare Other | Admitting: Nurse Practitioner

## 2021-02-16 ENCOUNTER — Other Ambulatory Visit: Payer: Self-pay

## 2021-02-16 ENCOUNTER — Ambulatory Visit (HOSPITAL_COMMUNITY)
Admission: RE | Admit: 2021-02-16 | Discharge: 2021-02-16 | Disposition: A | Discharge status: Home / Self Care | Payer: Medicare Other | Source: Ambulatory Visit | Attending: Nurse Practitioner | Admitting: Nurse Practitioner

## 2021-02-16 VITALS — BP 144/84 | HR 69

## 2021-02-16 DIAGNOSIS — I480 Paroxysmal atrial fibrillation: Secondary | ICD-10-CM | POA: Diagnosis present

## 2021-02-16 DIAGNOSIS — I4510 Unspecified right bundle-branch block: Secondary | ICD-10-CM | POA: Diagnosis not present

## 2021-02-16 NOTE — Progress Notes (Signed)
Pt is in clinic for repeat ekg as he was found to be in rate controlled afib last week on regular visit. Afib today at 69 bpm, qrs int 108 ms, qtc 417 ms. He is asymptomatic. I offered an appointment to discuss with Dr. Rayann Heman to discuss repeat ablation. He said that he was in no hurry and wanted an appointment in September to go back and discuss. No change in treatment today.

## 2021-02-20 ENCOUNTER — Encounter: Payer: Self-pay | Admitting: Cardiology

## 2021-02-20 NOTE — Progress Notes (Signed)
[facility] Note  02/21/2021     CHIEF COMPLAINT Patient presents for Retina Follow Up   HISTORY OF PRESENT ILLNESS: Lucas Dudley is a 80 y.o. male who presents to the clinic today for:   HPI    Retina Follow Up    Patient presents with  Diabetic Retinopathy.  In both eyes.  This started 4 weeks ago.  I, the attending physician,  performed the HPI with the patient and updated documentation appropriately.          Comments    Patient here for 4 weeks retina follow up for NPDR OU. Patient states vision does pretty good. When uses drops vision gets blurry for an hour. No eye pain.       Last edited by Bernarda Caffey, MD on 02/21/2021  5:36 PM. (History)      Referring physician: Hortencia Pilar, MD [place],  [place] [address]  HISTORICAL INFORMATION:   Selected notes from the MEDICAL RECORD NUMBER Referred by Dr. Kathlen Mody for DEE   CURRENT MEDICATIONS: Current Outpatient Medications (Ophthalmic Drugs)  Medication Sig  . Carboxymethylcellul-Glycerin (LUBRICATING EYE DROPS OP) Apply 1 drop to eye daily as needed (itching eyes).  . dorzolamide-timolol (COSOPT) 22.3-6.8 MG/ML ophthalmic solution Place 1 drop into both eyes 2 (two) times daily.   No current facility-administered medications for this visit. (Ophthalmic Drugs)   Current Outpatient Medications (Other)  Medication Sig  . amLODipine (NORVASC) 5 MG tablet TAKE 1 TABLET BY MOUTH EVERY DAY  . APPLE CIDER VINEGAR PO Take by mouth.  . carvedilol (COREG) 6.25 MG tablet Takes 1 tablet by mouth in the AM and 1 tablet by mouth in the PM  . ELDERBERRY PO Take by mouth daily.  [name] ELIQUIS 5 MG TABS tablet TAKE 1 TABLET TWICE A DAY  . furosemide (LASIX) 40 MG tablet TAKE 1 TABLET BY MOUTH EVERY DAY  . gemfibrozil (LOPID) 600 MG tablet Take 600 mg by mouth at bedtime.   [name] glimepiride (AMARYL) 2 MG tablet Take 2 mg by mouth 2 (two) times daily.   . Ibuprofen-diphenhydrAMINE  Cit (ADVIL PM PO) Take by mouth at bedtime.  . Insulin Glargine (BASAGLAR KWIKPEN) 100 UNIT/ML SOPN Inject 40 Units into the skin at bedtime.   [name] lisinopril (ZESTRIL) 40 MG tablet TAKE 1 TABLET BY MOUTH EVERY DAY  . MELATONIN PO Take by mouth.  . metFORMIN (GLUCOPHAGE-XR) 500 MG 24 hr tablet Take 1,000 mg by mouth 2 (two) times daily.  . Omega-3 Fatty Acids (FISH OIL PO) Take by mouth daily.  . valACYclovir (VALTREX) 1000 MG tablet Take 1,000 mg by mouth daily.   [name] zinc gluconate 50 MG tablet Take 50 mg by mouth daily.   No current facility-administered medications for this visit. (Other)   REVIEW OF SYSTEMS: ROS    Positive for: Gastrointestinal, Endocrine, Cardiovascular, Eyes, Respiratory   Negative for: Constitutional, Neurological, Skin, Genitourinary, Musculoskeletal, HENT, Psychiatric, Allergic/Imm, Heme/Lymph   Last edited by Theodore Demark, COA on 02/21/2021  2:02 PM. (History)     ALLERGIES Allergies  Allergen Reactions  . Tape Rash and Other (See Comments)    Causes skin redness, Use paper tape only.   PAST MEDICAL HISTORY Past Medical History:  Diagnosis Date  . Arthritis    "hands probably" (05/04/2015)  . CAD (coronary artery disease)    a. Cath 03/17/15 showing 100% ostial D1, 50% prox LAD to mid LAD, 40% RPDA  stenosis. Med rx. // Myoview 01/2020: EF 31 diffuse perfusion defect without reversibility (suspect artifact); reviewed with Dr. Aggie Cosier study felt to be low risk  . Cataract    Mixed form OD  . Chronic diastolic CHF (congestive heart failure) (HCC)    Echocardiogram 01/2020: EF 55-60, no RWMA, moderate LVH, GR 1 DD, normal RV SF, RVSP 28.2, mild MR, mild aortic valve sclerosis (no aortic stenosis)  . Diabetic retinopathy ([place])    NPDR OU  . Glaucoma    POAG OU  . History of gout   . Hyperlipidemia   . Hypertension   . Hypertensive retinopathy    OU  . Kidney stones    "found during prostate OR; still in there as far as I know" (05/04/2015)  .  Melanoma of neck ([place])    "right; came back IV; no chemo"  . NICM (nonischemic cardiomyopathy) ([place])   . OSA on CPAP 2012  . Paroxysmal atrial fibrillation (HCC)    a. 04/2015 Tikosyn loading, DCCV x 1;  b. CHA2DS2VASc = 5-->Eliquis.  . Prostate cancer ([place])   . Type II diabetes mellitus ([place])    Past Surgical History:  Procedure Laterality Date  . ABDOMINAL HERNIA REPAIR     w/mesh  . CARDIAC CATHETERIZATION N/A 03/17/2015   Procedure: Left Heart Cath and Coronary Angiography;  Surgeon: Lorretta Harp, MD;  Location: [facility];  Service: Cardiovascular;  Laterality: N/A;  . carotid doppler  09/17/2008   rigt and left ICAs 0-49%;mildly  abnormal  . CATARACT EXTRACTION Left 2020   Dr. Kathlen Mody  . COLONOSCOPY N/A 05/16/2017   Procedure: COLONOSCOPY;  Surgeon: Rogene Houston, MD;  [facility];  Service: Endoscopy;  Laterality: N/A;  930  . DOPPLER ECHOCARDIOGRAPHY  05/25/2009   EF 50-55%,LA mildly dilated, LV function normal  . ELECTROPHYSIOLOGIC STUDY N/A 04/05/2015   Procedure: Cardioversion;  Surgeon: Sanda Klein, MD;  Location: [facility];  Service: Cardiovascular;  Laterality: N/A;  . ELECTROPHYSIOLOGIC STUDY N/A 09/06/2015   Procedure: Atrial Fibrillation Ablation;  Surgeon: Thompson Grayer, MD;  Location: [facility];  Service: Cardiovascular;  Laterality: N/A;  . ELECTROPHYSIOLOGIC STUDY N/A 07/12/2016   redo afib ablation by Dr Rayann Heman  . EXCISIONAL HEMORRHOIDECTOMY     "inside and out"  . EYE SURGERY Left 2020   Cat Sx - Dr. Kathlen Mody  . FINE NEEDLE ASPIRATION Right    knee; "drew ~ 1 quart off"  . HERNIA REPAIR    . LAPAROSCOPIC CHOLECYSTECTOMY    . MELANOMA EXCISION Right    "neck"  . NM MYOCAR PERF WALL MOTION  02/21/2012   EF 61% ,EXERCISE 7 METS. exercise stopped due to wheezing and shortness of breathe  . POLYPECTOMY  05/16/2017   Procedure: POLYPECTOMY;  Surgeon: Rogene Houston, MD;  [facility];  Service: Endoscopy;;   colon  . PROSTATECTOMY    . SHOULDER OPEN ROTATOR CUFF REPAIR Right X 2  . TEE WITHOUT CARDIOVERSION N/A 09/05/2015   Procedure: TRANSESOPHAGEAL ECHOCARDIOGRAM (TEE);  Surgeon: Sueanne Margarita, MD;  Location[facility];  Service: Cardiovascular;  Laterality: N/A;   FAMILY HISTORY Family History  Problem Relation Age of Onset  . Heart disease Mother   . Lung cancer Mother   . Heart attack Mother 23  . Stroke Brother   . Healthy Daughter   . Diabetes Father   . Heart disease Father    SOCIAL HISTORY Social History   Tobacco  Use  . Smoking status: Former Smoker    Packs/day: 2.00    Years: 27.00    Pack years: 54.00    Types: Cigarettes    Quit date: 10/31/1983    Years since quitting: 37.3  . Smokeless tobacco: Never Used  Substance Use Topics  . Alcohol use: No    Alcohol/week: 0.0 standard drinks    Comment: "used to drink; stopped ~ 2008"  . Drug use: No         OPHTHALMIC EXAM:  Base Eye Exam    Visual Acuity (Snellen - Linear)      Right Left   Dist cc 20/40 -2 20/40   Dist ph cc NI NI   Correction: Glasses       Tonometry (Tonopen, 1:59 PM)      Right Left   Pressure 22 20       Pupils      Dark Light Shape React APD   Right 3 2 Round Minimal None   Left 3 2 Round Minimal None       Visual Fields (Counting fingers)      Left Right    Full Full       Extraocular Movement      Right Left    Full, Ortho Full, Ortho       Neuro/Psych    Oriented x3: Yes   Mood/Affect: Normal       Dilation    Both eyes: 1.0% Mydriacyl, 2.5% Phenylephrine @ 1:59 PM        Slit Lamp and Fundus Exam    Slit Lamp Exam      Right Left   Lids/Lashes Dermato, mild MGD Dermato, mild MGD   Conjunctiva/Sclera Temporal pinguecula Temporal pinguecula   Cornea EBMD, mild haze, 1-2+ PEE, mild Debris in tear film EBMD, mild haze, trace PEE, well healed temporal cataract wounds, arcus   Anterior Chamber Deep and clear; narrow temporal angle Deep and clear   Iris  Round and dilated, mild anterior bowing, No NVI Round and moderately dilated to 5.57mm   Lens 3+ NS with brunesence, 2-3+ cortical PCIOL, trace PCO - non central   Vitreous Synerisis Synerisis       Fundus Exam      Right Left   Disc Pink, sharp, +cupping, central pallor, Thin inferior rim Pink and sharp, +cupping, +PPA   C/D Ratio 0.7 0.6   Macula Flat, blunted foveal reflex, trace central cystic changes - slightly improved, RPE mottling and clumping, no heme Flat, blunted foveal reflex, clusters of fine MA nasal and superior to fovea -- improved, +cystic changes -- persistent/slightly improved, focal exudates superior to fovea - improving   Vessels attenuated, Tortuous Attenuated, mild av crossing changes, mild tortuousity   Periphery Attached, Focal dot heme below nerve (0600) - improved, rare MA Attached.  No heme.        Refraction    Wearing Rx      Sphere Cylinder Axis Add   Right -0.75 +3.50 163 +2.50   Left -1.00 +1.50 015 +2.50          IMAGING AND PROCEDURES  Imaging and Procedures for 02/21/2021  OCT, Retina - OU - Both Eyes       Right Eye Quality was borderline. Central Foveal Thickness: 302. Progression has improved. Findings include abnormal foveal contour, no SRF, vitreomacular adhesion , intraretinal fluid (cystic changes superior fovea -- mildly improved).   Left Eye Quality was good. Central Foveal Thickness: 417.  Progression has improved. Findings include abnormal foveal contour, intraretinal fluid, intraretinal hyper-reflective material (persistent IRF improved centrally; partial PVD).   Notes *Images captured and stored on drive  Diagnosis / Impression:  Mild DME OU (OS > OD) OD: cystic changes superior fovea -- mildly improved OS: persistent IRF improved centrally; partial PVD  Clinical management:  See below  Abbreviations: NFP - Normal foveal profile. CME - cystoid macular edema. PED - pigment epithelial detachment. IRF - intraretinal fluid.  SRF - subretinal fluid. EZ - ellipsoid zone. ERM - epiretinal membrane. ORA - outer retinal atrophy. ORT - outer retinal tubulation. SRHM - subretinal hyper-reflective material. IRHM - intraretinal hyper-reflective material       Intravitreal Injection, Pharmacologic Agent - OS - Left Eye       Time Out 02/21/2021. 3:02 PM. Confirmed correct patient, procedure, site, and patient consented.   Anesthesia Topical anesthesia was used. Anesthetic medications included Proparacaine 0.5%, Lidocaine 2%.   Procedure Preparation included 5% betadine to ocular surface, eyelid speculum. A (32g) needle was used.   Injection:  2 mg aflibercept Alfonse Flavors) SOLN   NDC: 59563-875-64, Lot: [id number], Expiration date: 05/14/2021   Route: Intravitreal, Site: Left Eye, Waste: 0.05 mL  Post-op Post injection exam found visual acuity of at least counting fingers. The patient tolerated the procedure well. There were no complications. The patient received written and verbal post procedure care education. Post injection medications were not given.   Notes **SAMPLE MEDICATION ADMINISTERED**                ASSESSMENT/PLAN:    ICD-10-CM   1. 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, associated with type 2 diabetes mellitus (HCC)  Y60.6301 Intravitreal Injection, Pharmacologic Agent - OS - Left Eye    aflibercept (EYLEA) SOLN 2 mg  2. Retinal edema  H35.81 OCT, Retina - OU - Both Eyes  3. Essential hypertension  I10   4. Hypertensive retinopathy of both eyes  H35.033   5. Combined forms of age-related cataract of right eye  H25.811   6. 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 History of herpes zoster of eye  Z86.19   9. Primary open angle glaucoma of both eyes, unspecified glaucoma stage  H40.1130     1,2. 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 Single  focal MA superior to fovea.                                  OS: Perifoveal Mas w/late leakage - OCT shows diabetic macular edema OU (OS>OD) - s/p IVA OS # 1(07.26.21), #2 (08.24.21), #3 (09.21.21), #4 (10.22.21) -- IVA resistance - IVE OS #1 (11.22.21), #2 (12.21.21), #3 (01.20.22), #4 (02.18.22), #5 (03.18.22), #6 (04.15.22) - BCVA OS down to 20/40 from 20/30 - OCT shows persistent IRF improved centrally; partial PVD  - recommend IVE OS #7 (sample) today, 05.10.22 - pt wishes to proceed with injection - RBA of procedure discussed, questions answered - IVA informed consent obtained and signed, 07.26.21 (OS) - IVE informed consent obtained and signed, 11.22.21 (OS) - see procedure note - Eylea4U benefits investigation started, 09.21.21 -- approved as of 11.22.21 - f/u in 4 wks -- DFE/OCT, possible injection  3,4. Hypertensive retinopathy OU - discussed importance of tight BP control - monitor  5. Mixed  Cataract OD - The symptoms of cataract, surgical options, and treatments and risks were discussed with patient. - discussed diagnosis and progression - under the expert management of Dr. Kathlen Mody  6. Pseudophakia OS  - s/p CE/IOL OS (2020, Dr. Kathlen Mody)  - IOL in good position, doing well  - monitor  7. 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 Roby Lofts at [facility] W/S, who recommended SuperK prior to [place]  8. History of herpes zoster/iritis OS  - on valtrex 1g daily -- maintenance    9. POAG OU  - under the expert management of Dr. Kathlen Mody  - IOP 22, 20 today  - currently on Cosopt to BID OU   Ophthalmic Meds Ordered this visit:  Meds ordered this encounter  Medications  . aflibercept (EYLEA) SOLN 2 mg      Return in about 4 weeks (around 03/21/2021) for 4 wk f/u for mild NPDR OU w/DFE/OCT/likely inj..  There are no Patient Instructions on file for this visit.  This document serves as a record of services personally performed by Gardiner Sleeper, MD, PhD. It was created on their behalf by Leeann Must, [place], an ophthalmic technician. The creation of this record is the provider's dictation and/or activities during the visit.    Electronically signed by: Leeann Must, COA @TODAY @ 5:38 PM  This document serves as a record of services personally performed by Gardiner Sleeper, MD, PhD. It was created on their behalf by Estill Bakes, COT an ophthalmic technician. The creation of this record is the provider's dictation and/or activities during the visit.    Electronically signed by: Estill Bakes, COT 5.10.22 @ 5:38 PM  Gardiner Sleeper, M.D., Ph.D. Diseases & Surgery of the Retina and [place] 02/21/2021   I have reviewed the above documentation for accuracy and completeness, and I agree with the above. Gardiner Sleeper, M.D., Ph.D. 02/21/21 5:38 PM  Abbreviations: M myopia (nearsighted); A astigmatism; H hyperopia (farsighted); P presbyopia; Mrx spectacle prescription;  CTL contact lenses; OD right eye; OS left eye; OU both eyes  XT exotropia; ET esotropia; PEK punctate epithelial keratitis; PEE punctate epithelial erosions; DES dry eye syndrome; MGD meibomian gland dysfunction; ATs artificial tears; PFAT's preservative free artificial tears; [place] nuclear sclerotic cataract; PSC posterior subcapsular cataract; ERM epi-retinal membrane; PVD posterior vitreous detachment; RD retinal detachment; DM diabetes mellitus; DR diabetic retinopathy; NPDR non-proliferative diabetic retinopathy; PDR proliferative diabetic retinopathy; CSME clinically significant macular edema; DME diabetic macular edema; dbh dot blot hemorrhages; CWS cotton wool spot; POAG primary open angle glaucoma; C/D cup-to-disc ratio; HVF humphrey visual field; GVF goldmann visual field; OCT optical coherence tomography; IOP intraocular pressure; BRVO Branch retinal vein occlusion; CRVO central retinal vein occlusion; CRAO central retinal artery  occlusion; BRAO branch retinal artery occlusion; RT retinal tear; SB scleral buckle; PPV pars plana vitrectomy; VH Vitreous hemorrhage; PRP panretinal laser photocoagulation; IVK intravitreal kenalog; VMT vitreomacular traction; MH Macular hole;  NVD neovascularization of the disc; NVE neovascularization elsewhere; AREDS age related eye disease study; ARMD age related macular degeneration; POAG primary open angle glaucoma; EBMD epithelial/anterior basement membrane dystrophy; ACIOL anterior chamber intraocular lens; IOL intraocular lens; PCIOL posterior chamber intraocular lens; Phaco/IOL phacoemulsification with intraocular lens placement; [place] photorefractive keratectomy; LASIK laser assisted in situ keratomileusis; HTN hypertension; DM diabetes mellitus; COPD chronic obstructive pulmonary disease

## 2021-02-21 ENCOUNTER — Ambulatory Visit (INDEPENDENT_AMBULATORY_CARE_PROVIDER_SITE_OTHER): Payer: Medicare Other | Admitting: Ophthalmology

## 2021-02-21 ENCOUNTER — Encounter (INDEPENDENT_AMBULATORY_CARE_PROVIDER_SITE_OTHER): Payer: Self-pay | Admitting: Ophthalmology

## 2021-02-21 ENCOUNTER — Other Ambulatory Visit: Payer: Self-pay

## 2021-02-21 DIAGNOSIS — E113213 Type 2 diabetes mellitus with mild nonproliferative diabetic retinopathy with macular edema, bilateral: Secondary | ICD-10-CM | POA: Diagnosis not present

## 2021-02-21 DIAGNOSIS — H25811 Combined forms of age-related cataract, right eye: Secondary | ICD-10-CM

## 2021-02-21 DIAGNOSIS — Z961 Presence of intraocular lens: Secondary | ICD-10-CM

## 2021-02-21 DIAGNOSIS — I10 Essential (primary) hypertension: Secondary | ICD-10-CM | POA: Diagnosis not present

## 2021-02-21 DIAGNOSIS — H3581 Retinal edema: Secondary | ICD-10-CM | POA: Diagnosis not present

## 2021-02-21 DIAGNOSIS — H401130 Primary open-angle glaucoma, bilateral, stage unspecified: Secondary | ICD-10-CM

## 2021-02-21 DIAGNOSIS — H35033 Hypertensive retinopathy, bilateral: Secondary | ICD-10-CM

## 2021-02-21 DIAGNOSIS — H18523 Epithelial (juvenile) corneal dystrophy, bilateral: Secondary | ICD-10-CM

## 2021-02-21 DIAGNOSIS — Z8619 Personal history of other infectious and parasitic diseases: Secondary | ICD-10-CM

## 2021-02-21 MED ORDER — AFLIBERCEPT 2MG/0.05ML IZ SOLN FOR KALEIDOSCOPE
2.0000 mg | INTRAVITREAL | Status: AC | PRN
  Administered 2021-02-21: 2 mg via INTRAVITREAL

## 2021-02-24 ENCOUNTER — Encounter (INDEPENDENT_AMBULATORY_CARE_PROVIDER_SITE_OTHER): Payer: Medicare Other | Admitting: Ophthalmology

## 2021-03-02 ENCOUNTER — Encounter: Payer: Self-pay | Admitting: Cardiology

## 2021-03-02 NOTE — Progress Notes (Signed)
Cardiology Office Note  Date: 03/03/2021   ID: Lucas Dudley, Lucas Dudley July 30, 1941, MRN [id number]  PCP:  [name], MD  Cardiologist:  Rozann Lesches, MD Electrophysiologist:  Thompson Grayer, MD   Chief Complaint  Patient presents with  . Cardiac follow-up    History of Present Illness: Lucas Dudley is a 80 y.o. male former patient of Dr. Meda Coffee now presenting to establish follow-up with me.  I reviewed his records and updated the chart.  He was last seen in the atrial fibrillation clinic in April.  He is here for a routine visit.  Reports dyspnea exertion has been stable, largely NYHA class II, no angina symptoms.  Has to rest when he pushes his trash can out to the street.  Cardiac structural and ischemic testing from April of last year reviewed and outlined below.  Myoview showed regions of myocardial scarring with sparing of the mid lateral wall, no active ischemia however.  LVEF falsely reduced by that study and found to be normal by echocardiogram the same day in the range of 55 to 60%.  Reviewed his medications.  He reports a history of statin intolerance, recalls significant leg pain and weakness on Lipitor.  He has been on gemfibrozil per Dr. Karie Kirks.  We are requesting his most recent lab work.  Past Medical History:  Diagnosis Date  . Arthritis   . Atrial fibrillation ([place])   . CAD (coronary artery disease)    a. Cath 03/17/15 showing 100% ostial D1, 50% prox LAD to mid LAD, 40% RPDA stenosis. Med rx. // Myoview 01/2020: EF 31 diffuse perfusion defect without reversibility (suspect artifact); reviewed with Dr. Aggie Cosier study felt to be low risk  . Cataract    Mixed form OD  . Chronic diastolic CHF (congestive heart failure) ([place])   . Diabetic retinopathy ([place])    NPDR OU  . Essential hypertension   . Glaucoma    POAG OU  . History of gout   . Hyperlipidemia   . Hypertensive retinopathy    OU  . Kidney stones   . Melanoma of neck ([place])   . NICM  (nonischemic cardiomyopathy) ([place])   . OSA on CPAP 2012  . Prostate cancer ([place])   . Type II diabetes mellitus ([place])     Past Surgical History:  Procedure Laterality Date  . ABDOMINAL HERNIA REPAIR     w/mesh  . CARDIAC CATHETERIZATION N/A 03/17/2015   Procedure: Left Heart Cath and Coronary Angiography;  Surgeon: Lorretta Harp, MD;  Location: [facility];  Service: Cardiovascular;  Laterality: N/A;  . carotid doppler  09/17/2008   rigt and left ICAs 0-49%;mildly  abnormal  . CATARACT EXTRACTION Left 2020   Dr. Kathlen Mody  . COLONOSCOPY N/A 05/16/2017   Procedure: COLONOSCOPY;  Surgeon: Rogene Houston, MD;  [facility];  Service: Endoscopy;  Laterality: N/A;  930  . DOPPLER ECHOCARDIOGRAPHY  05/25/2009   EF 50-55%,LA mildly dilated, LV function normal  . ELECTROPHYSIOLOGIC STUDY N/A 04/05/2015   Procedure: Cardioversion;  Surgeon: Sanda Klein, MD;  Location: [facility];  Service: Cardiovascular;  Laterality: N/A;  . ELECTROPHYSIOLOGIC STUDY N/A 09/06/2015   Procedure: Atrial Fibrillation Ablation;  Surgeon: Thompson Grayer, MD;  Location: [facility];  Service: Cardiovascular;  Laterality: N/A;  . ELECTROPHYSIOLOGIC STUDY N/A 07/12/2016   redo afib ablation by Dr Rayann Heman  . EXCISIONAL HEMORRHOIDECTOMY     "inside and out"  . EYE SURGERY Left 2020  Cat Sx - Dr. Kathlen Mody  . FINE NEEDLE ASPIRATION Right    knee; "drew ~ 1 quart off"  . HERNIA REPAIR    . LAPAROSCOPIC CHOLECYSTECTOMY    . MELANOMA EXCISION Right    "neck"  . NM MYOCAR PERF WALL MOTION  02/21/2012   EF 61% ,EXERCISE 7 METS. exercise stopped due to wheezing and shortness of breathe  . POLYPECTOMY  05/16/2017   Procedure: POLYPECTOMY;  Surgeon: Rogene Houston, MD;  [facility];  Service: Endoscopy;;  colon  . PROSTATECTOMY    . SHOULDER OPEN ROTATOR CUFF REPAIR Right X 2  . TEE WITHOUT CARDIOVERSION N/A 09/05/2015   Procedure: TRANSESOPHAGEAL ECHOCARDIOGRAM (TEE);  Surgeon:  Sueanne Margarita, MD;  Location: [facility]OSCOPY;  Service: Cardiovascular;  Laterality: N/A;    Current Outpatient Medications  Medication Sig Dispense Refill  . amLODipine (NORVASC) 5 MG tablet TAKE 1 TABLET BY MOUTH EVERY DAY 90 tablet 2  . Carboxymethylcellul-Glycerin (LUBRICATING EYE DROPS OP) Apply 1 drop to eye daily as needed (itching eyes).    . carvedilol (COREG) 6.25 MG tablet Takes 1 tablet by mouth in the AM and 1 tablet by mouth in the PM    . dorzolamide-timolol (COSOPT) 22.3-6.8 MG/ML ophthalmic solution Place 1 drop into both eyes 2 (two) times daily. 10 mL 6  . ELIQUIS 5 MG TABS tablet TAKE 1 TABLET TWICE A DAY 180 tablet 1  . furosemide (LASIX) 40 MG tablet TAKE 1 TABLET BY MOUTH EVERY DAY 90 tablet 3  . gemfibrozil (LOPID) 600 MG tablet Take 600 mg by mouth at bedtime.     [name] glimepiride (AMARYL) 2 MG tablet Take 2 mg by mouth 2 (two) times daily.     . Ibuprofen-diphenhydrAMINE Cit (ADVIL PM PO) Take by mouth at bedtime.    . Insulin Glargine (BASAGLAR KWIKPEN) 100 UNIT/ML SOPN Inject 40 Units into the skin at bedtime.     [name] lisinopril (ZESTRIL) 40 MG tablet TAKE 1 TABLET BY MOUTH EVERY DAY 90 tablet 3  . Melatonin 10 MG TABS Take 1 tablet by mouth at bedtime.    . metFORMIN (GLUCOPHAGE-XR) 500 MG 24 hr tablet Take 1,000 mg by mouth 2 (two) times daily.    . pioglitazone (ACTOS) 15 MG tablet Take 15 mg by mouth every morning.    . valACYclovir (VALTREX) 1000 MG tablet Take 1,000 mg by mouth daily.      No current facility-administered medications for this visit.   Allergies:  Tape   ROS: No sense of palpitations, no syncope.  Physical Exam: VS:  BP 120/76   Pulse (!) 57   Ht 5\' 10"  (1.778 m)   Wt 247 lb (112 kg)   SpO2 98%   BMI 35.44 kg/m , BMI Body mass index is 35.44 kg/m.  Wt Readings from Last 3 Encounters:  03/03/21 247 lb (112 kg)  02/02/21 242 lb (109.8 kg)  09/21/20 245 lb (111.1 kg)    General: Patient appears comfortable at rest. HEENT: Conjunctiva  and lids normal, wearing a mask. Neck: Supple, no elevated JVP or carotid bruits, no thyromegaly. Lungs: Clear to auscultation, nonlabored breathing at rest. Cardiac: Irregularly irregular, no S3, 1/6 systolic murmur, no pericardial rub. Extremities: No pitting edema.  ECG:  An ECG dated 02/16/2021 was personally reviewed today and demonstrated:  Rate controlled atrial fibrillation with incomplete right bundle branch block and left anterior fascicular block.  Recent Labwork: 02/02/2021: ALT 13; AST 16; BUN 20; Creatinine, Ser 1.12;  Hemoglobin 12.9; Platelets 241; Potassium 3.7; Sodium 137   Other Studies Reviewed Today:  Echocardiogram 02/02/2020: 1. Left ventricular ejection fraction, by estimation, is 55 to 60%. The  left ventricle has normal function. The left ventricle has no regional  wall motion abnormalities. There is moderate concentric left ventricular  hypertrophy. Left ventricular  diastolic parameters are consistent with Grade I diastolic dysfunction  (impaired relaxation).  2. Right ventricular systolic function is normal. The right ventricular  size is normal. There is normal pulmonary artery systolic pressure. The  estimated right ventricular systolic pressure is 74.2 mmHg.  3. The mitral valve is grossly normal. Mild mitral valve regurgitation.  No evidence of mitral stenosis.  4. The aortic valve is tricuspid. Aortic valve regurgitation is not  visualized. Mild aortic valve sclerosis is present, with no evidence of  aortic valve stenosis.  5. The inferior vena cava is normal in size with greater than 50%  respiratory variability, suggesting right atrial pressure of 3 mmHg.   Lexiscan Myoview 02/02/2020:  Nuclear stress EF: 31%.  There was no ST segment deviation noted during stress.  Findings consistent with prior myocardial infarction.  This is a high risk study.  The left ventricular ejection fraction is moderately decreased (30-44%).   Low exercise  tolerance (3 METs), unable to reach target heart rate, so study converted to pharmacologic. Baseline blood pressure very elevated at baseline (198/96). There are diffuse perfusion defects at rest and with stress, sparing only the mid-lateral wall. There is no significant reversibility on this study. Based on distribution, would be concerned that this is artifact. However, with low EF, suggests diffuse areas of poor perfusion without significant reversibility.  Assessment and Plan:  1.  Persistent atrial fibrillation with CHA2DS2-VASc score of 5.  Plan to continue strategy of heart rate control and anticoagulation.  Rhythm control strategy has not been successful long-term despite Tikosyn with previous cardioversion and ablation.  Heart rate is controlled today.  2.  CAD as outlined above, managed medically.  Follow-up Myoview last year showed fairly diffuse regions of scar without active ischemia and his LVEF was normal at 55 to 60% by concurrent echocardiogram.  No definite angina reported.  Continue Coreg, Norvasc, gemfibrozil, and lisinopril.  3.  Hypertension, blood pressure is well controlled today on current regimen.  Medication Adjustments/Labs and Tests Ordered: Current medicines are reviewed at length with the patient today.  Concerns regarding medicines are outlined above.   Tests Ordered: No orders of the defined types were placed in this encounter.   Medication Changes: No orders of the defined types were placed in this encounter.   Disposition:  Follow up 6 months.  Signed, Satira Sark, MD, [facility] 03/03/2021 11:24 AM    [facility], [place], [place] [address] Phone: [phone]; Fax: [phone]

## 2021-03-03 ENCOUNTER — Other Ambulatory Visit: Payer: Self-pay

## 2021-03-03 ENCOUNTER — Encounter: Payer: Self-pay | Admitting: Cardiology

## 2021-03-03 ENCOUNTER — Ambulatory Visit: Payer: Medicare Other | Admitting: Cardiology

## 2021-03-03 ENCOUNTER — Encounter: Payer: Self-pay | Admitting: *Deleted

## 2021-03-03 VITALS — BP 120/76 | HR 57 | Ht 70.0 in | Wt 247.0 lb

## 2021-03-03 DIAGNOSIS — I10 Essential (primary) hypertension: Secondary | ICD-10-CM | POA: Diagnosis not present

## 2021-03-03 DIAGNOSIS — I4819 Other persistent atrial fibrillation: Secondary | ICD-10-CM | POA: Diagnosis not present

## 2021-03-03 DIAGNOSIS — I25119 Atherosclerotic heart disease of native coronary artery with unspecified angina pectoris: Secondary | ICD-10-CM

## 2021-03-03 NOTE — Patient Instructions (Addendum)
Medication Instructions:  Continue all current medications.   Labwork: none  Testing/Procedures: none  Follow-Up: 6 months   Any Other Special Instructions Will Be Listed Below (If Applicable).   If you need a refill on your cardiac medications before your next appointment, please call your pharmacy.  

## 2021-03-21 ENCOUNTER — Ambulatory Visit: Payer: Medicare Other | Admitting: Cardiology

## 2021-03-21 NOTE — Progress Notes (Signed)
[facility] Note  03/22/2021     CHIEF COMPLAINT Patient presents for Retina Follow Up   HISTORY OF PRESENT ILLNESS: Lucas Dudley is a 80 y.o. male who presents to the clinic today for:   HPI    Retina Follow Up    Patient presents with  Diabetic Retinopathy.  In both eyes.  This started weeks ago.  Severity is moderate.  Duration of weeks.  Since onset it is stable.  I, the attending physician,  performed the HPI with the patient and updated documentation appropriately.          Comments    Pt states vision is the same OU.  Pt denies eye pain or discomfort and denies any new or worsening floaters or fol OU.       Last edited by Bernarda Caffey, MD on 03/22/2021  3:57 PM. (History)    pt states his last A1c was between 9-10, so his Dr gave him Actos, which has dropped his blood sugar and makes him feel bad, pt is having a corneal procedure to smooth out his cornea with Dr. Edilia Bo in August  Referring physician: [name], MD [place],  [place] [address]  HISTORICAL INFORMATION:   Selected notes from the MEDICAL RECORD NUMBER Referred by Dr. Kathlen Mody for DEE   CURRENT MEDICATIONS: Current Outpatient Medications (Ophthalmic Drugs)  Medication Sig  . Carboxymethylcellul-Glycerin (LUBRICATING EYE DROPS OP) Apply 1 drop to eye daily as needed (itching eyes).  . dorzolamide-timolol (COSOPT) 22.3-6.8 MG/ML ophthalmic solution Place 1 drop into both eyes 2 (two) times daily.   No current facility-administered medications for this visit. (Ophthalmic Drugs)   Current Outpatient Medications (Other)  Medication Sig  . amLODipine (NORVASC) 5 MG tablet TAKE 1 TABLET BY MOUTH EVERY DAY  . carvedilol (COREG) 6.25 MG tablet Takes 1 tablet by mouth in the AM and 1 tablet by mouth in the PM  . ELIQUIS 5 MG TABS tablet TAKE 1 TABLET TWICE A DAY  . furosemide (LASIX) 40 MG tablet TAKE 1 TABLET BY MOUTH EVERY DAY  . gemfibrozil (LOPID) 600 MG  tablet Take 600 mg by mouth at bedtime.   [name] glimepiride (AMARYL) 2 MG tablet Take 2 mg by mouth 2 (two) times daily.   . Ibuprofen-diphenhydrAMINE Cit (ADVIL PM PO) Take by mouth at bedtime.  . Insulin Glargine (BASAGLAR KWIKPEN) 100 UNIT/ML SOPN Inject 40 Units into the skin at bedtime.   [name] lisinopril (ZESTRIL) 40 MG tablet TAKE 1 TABLET BY MOUTH EVERY DAY  . Melatonin 10 MG TABS Take 1 tablet by mouth at bedtime.  . metFORMIN (GLUCOPHAGE-XR) 500 MG 24 hr tablet Take 1,000 mg by mouth 2 (two) times daily.  . pioglitazone (ACTOS) 15 MG tablet Take 15 mg by mouth every morning.  . valACYclovir (VALTREX) 1000 MG tablet Take 1,000 mg by mouth daily.    No current facility-administered medications for this visit. (Other)   REVIEW OF SYSTEMS: ROS    Positive for: Gastrointestinal, Endocrine, Cardiovascular, Eyes, Respiratory   Negative for: Constitutional, Neurological, Skin, Genitourinary, Musculoskeletal, HENT, Psychiatric, Allergic/Imm, Heme/Lymph   Last edited by Doneen Poisson on 03/22/2021  1:50 PM. (History)     ALLERGIES Allergies  Allergen Reactions  . Tape Rash and Other (See Comments)    Causes skin redness, Use paper tape only.   PAST MEDICAL HISTORY Past Medical History:  Diagnosis Date  . Arthritis   . Atrial fibrillation ([place])   .  CAD (coronary artery disease)    a. Cath 03/17/15 showing 100% ostial D1, 50% prox LAD to mid LAD, 40% RPDA stenosis. Med rx. // Myoview 01/2020: EF 31 diffuse perfusion defect without reversibility (suspect artifact); reviewed with Dr. Aggie Cosier study felt to be low risk  . Cataract    Mixed form OD  . Chronic diastolic CHF (congestive heart failure) ([place])   . Diabetic retinopathy ([place])    NPDR OU  . Essential hypertension   . Glaucoma    POAG OU  . History of gout   . Hyperlipidemia   . Hypertensive retinopathy    OU  . Kidney stones   . Melanoma of neck ([place])   . NICM (nonischemic cardiomyopathy) ([place])   . OSA on CPAP 2012  .  Prostate cancer ([place])   . Type II diabetes mellitus ([place])    Past Surgical History:  Procedure Laterality Date  . ABDOMINAL HERNIA REPAIR     w/mesh  . CARDIAC CATHETERIZATION N/A 03/17/2015   Procedure: Left Heart Cath and Coronary Angiography;  Surgeon: Lorretta Harp, MD;  Location: [facility];  Service: Cardiovascular;  Laterality: N/A;  . carotid doppler  09/17/2008   rigt and left ICAs 0-49%;mildly  abnormal  . CATARACT EXTRACTION Left 2020   Dr. Kathlen Mody  . COLONOSCOPY N/A 05/16/2017   Procedure: COLONOSCOPY;  Surgeon: Rogene Houston, MD;  [facility];  Service: Endoscopy;  Laterality: N/A;  930  . DOPPLER ECHOCARDIOGRAPHY  05/25/2009   EF 50-55%,LA mildly dilated, LV function normal  . ELECTROPHYSIOLOGIC STUDY N/A 04/05/2015   Procedure: Cardioversion;  Surgeon: Sanda Klein, MD;  Location: [facility];  Service: Cardiovascular;  Laterality: N/A;  . ELECTROPHYSIOLOGIC STUDY N/A 09/06/2015   Procedure: Atrial Fibrillation Ablation;  Surgeon: Thompson Grayer, MD;  Location: [facility];  Service: Cardiovascular;  Laterality: N/A;  . ELECTROPHYSIOLOGIC STUDY N/A 07/12/2016   red[facility] afib ablation by Dr Rayann Heman  . EXCISIONAL HEMORRHOIDECTOMY     "inside and out"  . EYE SURGERY Left 2020   Cat Sx - Dr. Kathlen Mody  . FINE NEEDLE ASPIRATION Right    knee; "drew ~ 1 quart off"  . HERNIA REPAIR    . LAPAROSCOPIC CHOLECYSTECTOMY    . MELANOMA EXCISION Right    "neck"  . NM MYOCAR PERF WALL MOTION  02/21/2012   EF 61% ,EXERCISE 7 METS. exercise stopped due to wheezing and shortness of breathe  . POLYPECTOMY  05/16/2017   Procedure: POLYPECTOMY;  Surgeon: Rogene Houston, MD;  [facility];  Service: Endoscopy;;  colon  . PROSTATECTOMY    . SHOULDER OPEN ROTATOR CUFF REPAIR Right X 2  . TEE WITHOUT CARDIOVERSION N/A 09/05/2015   Procedure: TRANSESOPHAGEAL ECHOCARDIOGRAM (TEE);  Surgeon: Sueanne Margarita, MD;  Location: [facility];  Service:  Cardiovascular;  Laterality: N/A;   FAMILY HISTORY Family History  Problem Relation Age of Onset  . Heart disease Mother   . Lung cancer Mother   . Heart attack Mother 5  . Stroke Brother   . Healthy Daughter   . Diabetes Father   . Heart disease Father    SOCIAL HISTORY Social History   Tobacco Use  . Smoking status: Former Smoker    Packs/day: 2.00    Years: 27.00    Pack years: 54.00    Types: Cigarettes    Quit date: 10/31/1983    Years since quitting: 37.4  . Smokeless tobacco: Never Used  Substance Use Topics  . Alcohol use: No    Alcohol/week: 0.0 standard drinks    Comment: "used to drink; stopped ~ 2008"  . Drug use: No         OPHTHALMIC EXAM:  Base Eye Exam    Visual Acuity (Snellen - Linear)      Right Left   Dist cc 20/50 -2 20/30 -2   Dist ph cc 20/40 NI   Correction: Glasses       Tonometry (Tonopen, 1:58 PM)      Right Left   Pressure 21 18       Pupils      Dark Light Shape React APD   Right 2 1 Round Minimal 0   Left 2 1 Round Minimal 0       Visual Fields      Left Right    Full Full       Extraocular Movement      Right Left    Full Full       Neuro/Psych    Oriented x3: Yes   Mood/Affect: Normal       Dilation    Both eyes: 1.0% Mydriacyl, 2.5% Phenylephrine @ 1:58 PM        Slit Lamp and Fundus Exam    Slit Lamp Exam      Right Left   Lids/Lashes Dermato, mild MGD Dermato, mild MGD   Conjunctiva/Sclera Temporal pinguecula Temporal pinguecula   Cornea EBMD, mild haze, 1-2+ PEE, mild Debris in tear film EBMD, mild haze, trace PEE, well healed temporal cataract wounds, arcus   Anterior Chamber Deep and clear; narrow temporal angle Deep and clear   Iris Round and dilated, mild anterior bowing, No NVI Round and moderately dilated to 5.58mm   Lens 3+ NS with brunesence, 2-3+ cortical PCIOL, trace PCO - non central   Vitreous Synerisis Synerisis       Fundus Exam      Right Left   Disc Pink, sharp, +cupping,  central pallor, Thin inferior rim Pink and sharp, +cupping, +PPA   C/D Ratio 0.7 0.6   Macula Flat, blunted foveal reflex, trace central cystic changes, RPE mottling and clumping, no heme Flat, blunted foveal reflex, clusters of fine MA nasal and superior to fovea -- improved, +cystic changes -- slightly improved, focal exudates superior to fovea - improving   Vessels attenuated, Tortuous Attenuated, mild av crossing changes, mild tortuousity   Periphery Attached, Focal dot heme below nerve (0600) - improved, rare MA Attached.  No heme.        Refraction    Wearing Rx      Sphere Cylinder Axis Add   Right -0.75 +3.50 163 +2.50   Left -1.00 +1.50 015 +2.50          IMAGING AND PROCEDURES  Imaging and Procedures for 03/22/2021  OCT, Retina - OU - Both Eyes       Right Eye Quality was good. Central Foveal Thickness: 303. Progression has been stable. Findings include abnormal foveal contour, no SRF, vitreomacular adhesion , intraretinal fluid (Trace persistent cystic changes ).   Left Eye Quality was good. Central Foveal Thickness: 417. Progression has improved. Findings include abnormal foveal contour, intraretinal fluid, intraretinal hyper-reflective material (Interval improvement in IRF/cystic changes; partial PVD).   Notes *Images captured and stored on drive  Diagnosis / Impression:  Mild DME OU (OS > OD) OD: Trace persistent cystic changes  OS: Interval improvement in IRF/cystic changes; partial PVD  Clinical management:  See below  Abbreviations: NFP - Normal foveal profile. CME - cystoid macular edema. PED - pigment epithelial detachment. IRF - intraretinal fluid. SRF - subretinal fluid. EZ - ellipsoid zone. ERM - epiretinal membrane. ORA - outer retinal atrophy. ORT - outer retinal tubulation. SRHM - subretinal hyper-reflective material. IRHM - intraretinal hyper-reflective material       Intravitreal Injection, Pharmacologic Agent - OS - Left Eye       Time  Out 03/22/2021. 2:33 PM. Confirmed correct patient, procedure, site, and patient consented.   Anesthesia Topical anesthesia was used. Anesthetic medications included Proparacaine 0.5%, Lidocaine 2%.   Procedure Preparation included 5% betadine to ocular surface, eyelid speculum. A (32g) needle was used.   Injection:  2 mg aflibercept Alfonse Flavors) SOLN   NDC: [id number], Lot: [id number], Expiration date: 11/15/2021   Route: Intravitreal, Site: Left Eye, Waste: 0.05 mL  Post-op Post injection exam found visual acuity of at least counting fingers. The patient tolerated the procedure well. There were no complications. The patient received written and verbal post procedure care education. Post injection medications were not given.                 ASSESSMENT/PLAN:    ICD-10-CM   1. 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, associated with type 2 diabetes mellitus (HCC)  X79.3903 Intravitreal Injection, Pharmacologic Agent - OS - Left Eye    aflibercept (EYLEA) SOLN 2 mg  2. Retinal edema  H35.81 OCT, Retina - OU - Both Eyes  3. Essential hypertension  I10   4. Hypertensive retinopathy of both eyes  H35.033   5. Combined forms of age-related cataract of right eye  H25.811   6. 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 History of herpes zoster of eye  Z86.19   9. Primary open angle glaucoma of both eyes, unspecified glaucoma stage  H40.1130     1,2. 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 Single focal MA superior to fovea.                                  OS: Perifoveal Mas w/late leakage - OCT shows diabetic macular edema OU (OS>OD) - s/p IVA OS # 1(07.26.21), #2 (08.24.21), #3 (09.21.21), #4 (10.22.21) -- IVA resistance - IVE OS #1 (11.22.21), #2 (12.21.21), #3 (01.20.22), #4 (02.18.22), #5 (03.18.22), #6 (04.15.22), #7 (05.10.22) --  sample - BCVA OS improved to 20/30 from 20/40  - OCT shows  OD: Trace persistent cystic changes ; OS: Interval improvement in IRF/cystic changes; partial PVD - recommend IVE OS #8 today, 06.08.22 - pt wishes to proceed with injection - RBA of procedure discussed, questions answered - IVA informed consent obtained and signed, 07.26.21 (OS) - IVE informed consent obtained and signed, 11.22.21 (OS) - see procedure note - [employer] Days approved Eylea until 10/14/2021. - [employer]BS approved Eylea until 09/04/2021 - f/u in 4 wks -- DFE/OCT, possible injection  3,4. Hypertensive retinopathy OU - discussed importance of tight BP control - monitor  5. Mixed Cataract OD - The symptoms of cataract, surgical options, and treatments and risks were discussed with patient. - discussed diagnosis and progression - under the expert management of Dr. Kathlen Mody  6. Pseudophakia OS  - s/p CE/IOL OS (2020, Dr. Kathlen Mody)  - IOL in  good position, doing well  - monitor  7. 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 Roby Lofts at [facility], who recommended SuperK prior to [place]  - saw Dr. Susa Simmonds 6.6.22 -- procedure scheduled for August  8. History of herpes zoster/iritis OS  - on valrex 1g daily---maintenance   9. POAG OU  - under the expert management of Dr. Kathlen Mody  - IOP 21, 18 today  - currently on cosopt BID OU   Ophthalmic Meds Ordered this visit:  Meds ordered this encounter  Medications  . aflibercept (EYLEA) SOLN 2 mg      Return in about 4 weeks (around 04/19/2021) for f/u NPDR OU, DFE, OCT.  There are no Patient Instructions on file for this visit.  This document serves as a record of services personally performed by Gardiner Sleeper, MD, PhD. It was created on their behalf by Roselee Nova, COMT. The creation of this record is the provider's dictation and/or activities during the visit.  Electronically signed by: Roselee Nova, COMT 03/22/21 4:02 PM  This  document serves as a record of services personally performed by Gardiner Sleeper, MD, PhD. It was created on their behalf by San Jetty. Owens Shark, OA an ophthalmic technician. The creation of this record is the provider's dictation and/or activities during the visit.    Electronically signed by: San Jetty. Owens Shark, [place] 06.08.2022 4:02 PM   Gardiner Sleeper, M.D., Ph.D. Diseases & Surgery of the Retina and Vitreous [facility]  I have reviewed the above documentation for accuracy and completeness, and I agree with the above. Gardiner Sleeper, M.D., Ph.D. 03/22/21 4:02 PM   Abbreviations: M myopia (nearsighted); A astigmatism; H hyperopia (farsighted); P presbyopia; Mrx spectacle prescription;  CTL contact lenses; OD right eye; OS left eye; OU both eyes  XT exotropia; ET esotropia; PEK punctate epithelial keratitis; PEE punctate epithelial erosions; DES dry eye syndrome; MGD meibomian gland dysfunction; ATs artificial tears; PFAT's preservative free artificial tears; [place] nuclear sclerotic cataract; PSC posterior subcapsular cataract; ERM epi-retinal membrane; PVD posterior vitreous detachment; RD retinal detachment; DM diabetes mellitus; DR diabetic retinopathy; NPDR non-proliferative diabetic retinopathy; PDR proliferative diabetic retinopathy; CSME clinically significant macular edema; DME diabetic macular edema; dbh dot blot hemorrhages; CWS cotton wool spot; POAG primary open angle glaucoma; C/D cup-to-disc ratio; HVF humphrey visual field; GVF goldmann visual field; OCT optical coherence tomography; IOP intraocular pressure; BRVO Branch retinal vein occlusion; CRVO central retinal vein occlusion; CRAO central retinal artery occlusion; BRAO branch retinal artery occlusion; RT retinal tear; SB scleral buckle; PPV pars plana vitrectomy; VH Vitreous hemorrhage; PRP panretinal laser photocoagulation; IVK intravitreal kenalog; VMT vitreomacular traction; MH Macular hole;  NVD neovascularization  of the disc; NVE neovascularization elsewhere; AREDS age related eye disease study; ARMD age related macular degeneration; POAG primary open angle glaucoma; EBMD epithelial/anterior basement membrane dystrophy; ACIOL anterior chamber intraocular lens; IOL intraocular lens; PCIOL posterior chamber intraocular lens; Phaco/IOL phacoemulsification with intraocular lens placement; [place] photorefractive keratectomy; LASIK laser assisted in situ keratomileusis; HTN hypertension; DM diabetes mellitus; COPD chronic obstructive pulmonary disease

## 2021-03-22 ENCOUNTER — Ambulatory Visit (INDEPENDENT_AMBULATORY_CARE_PROVIDER_SITE_OTHER): Payer: Medicare Other | Admitting: Ophthalmology

## 2021-03-22 ENCOUNTER — Other Ambulatory Visit: Payer: Self-pay

## 2021-03-22 ENCOUNTER — Encounter (INDEPENDENT_AMBULATORY_CARE_PROVIDER_SITE_OTHER): Payer: Self-pay | Admitting: Ophthalmology

## 2021-03-22 ENCOUNTER — Telehealth: Payer: Self-pay | Admitting: Internal Medicine

## 2021-03-22 DIAGNOSIS — I10 Essential (primary) hypertension: Secondary | ICD-10-CM | POA: Diagnosis not present

## 2021-03-22 DIAGNOSIS — H25811 Combined forms of age-related cataract, right eye: Secondary | ICD-10-CM

## 2021-03-22 DIAGNOSIS — E113213 Type 2 diabetes mellitus with mild nonproliferative diabetic retinopathy with macular edema, bilateral: Secondary | ICD-10-CM

## 2021-03-22 DIAGNOSIS — H18523 Epithelial (juvenile) corneal dystrophy, bilateral: Secondary | ICD-10-CM

## 2021-03-22 DIAGNOSIS — H401130 Primary open-angle glaucoma, bilateral, stage unspecified: Secondary | ICD-10-CM

## 2021-03-22 DIAGNOSIS — Z8619 Personal history of other infectious and parasitic diseases: Secondary | ICD-10-CM

## 2021-03-22 DIAGNOSIS — H35033 Hypertensive retinopathy, bilateral: Secondary | ICD-10-CM | POA: Diagnosis not present

## 2021-03-22 DIAGNOSIS — Z961 Presence of intraocular lens: Secondary | ICD-10-CM

## 2021-03-22 DIAGNOSIS — H3581 Retinal edema: Secondary | ICD-10-CM

## 2021-03-22 MED ORDER — AFLIBERCEPT 2MG/0.05ML IZ SOLN FOR KALEIDOSCOPE
2.0000 mg | INTRAVITREAL | Status: AC | PRN
  Administered 2021-03-22: 2 mg via INTRAVITREAL

## 2021-03-22 NOTE — Telephone Encounter (Signed)
Speaking to the daughter [name] who is with the patient. He has swelling in his lower legs and feet. Weight 248 pounds this morning. Had his daughter test pitting edema, there is indention based on time of indention this sounds like it could be 2 + or more. Patient eats out often and may get increased salt from this. He is getting SOB only with exertion. He has not missed any medications. Based on controlled HR in Afib it is unknown what rhythm he is current in. Of note patient has persistent AF with controlled rate, which he was in at his visit in April and May. This does not seem to be the change that has occurred. Therefore will send this message to his general cardiologist for possible fluid management and advisement.   ED precautions give. Daughter and patient in agreement with plan and verbalized understanding.

## 2021-03-22 NOTE — Telephone Encounter (Addendum)
Daughter, [name] informed and says patient has been taking lasix 40 mg daily vs every other day. Advised to take 80 mg daily for the next few days and contact our office on Friday with an update on his symptoms. Per daughter, patient does weigh daily and weight has not changed. Verbalized understanding of plan.

## 2021-03-22 NOTE — Telephone Encounter (Signed)
Patient c/o Palpitations:  High priority if patient c/o lightheadedness, shortness of breath, or chest pain  1) How long have you had palpitations/irregular HR/ Afib? Are you having the symptoms now? About a month.   2) Are you currently experiencing lightheadedness, SOB or CP? SOB  3) Do you have a history of afib (atrial fibrillation) or irregular heart rhythm? yes  4) Have you checked your BP or HR? (document readings if available):  03/20/21: 130/72 HR 68  5) Are you experiencing any other symptoms? Fatigued    Daughter called. The patient has been in Afib since his appt with Butch Penny at the Afib clinic 02/16/21. Daughter wanted to know what to do.

## 2021-03-22 NOTE — Telephone Encounter (Signed)
Thank you for the update.  I just met him a few weeks ago for the first time.  If he is having worsening leg swelling, would suggest taking Lasix daily instead of every other day, for at least a few days to see if symptoms improve.  His baseline dose may need to be uptitrated from there.  Would have him check back in with the nursing.

## 2021-04-14 ENCOUNTER — Other Ambulatory Visit: Payer: Self-pay | Admitting: Internal Medicine

## 2021-04-14 ENCOUNTER — Ambulatory Visit: Payer: Medicare Other | Admitting: Internal Medicine

## 2021-04-14 ENCOUNTER — Encounter: Payer: Self-pay | Admitting: *Deleted

## 2021-04-14 ENCOUNTER — Encounter: Payer: Self-pay | Admitting: Internal Medicine

## 2021-04-14 ENCOUNTER — Telehealth: Payer: Self-pay | Admitting: *Deleted

## 2021-04-14 VITALS — BP 126/70 | HR 72 | Ht 70.0 in | Wt 257.4 lb

## 2021-04-14 DIAGNOSIS — I10 Essential (primary) hypertension: Secondary | ICD-10-CM | POA: Diagnosis not present

## 2021-04-14 DIAGNOSIS — R0602 Shortness of breath: Secondary | ICD-10-CM

## 2021-04-14 DIAGNOSIS — I5033 Acute on chronic diastolic (congestive) heart failure: Secondary | ICD-10-CM

## 2021-04-14 DIAGNOSIS — I4891 Unspecified atrial fibrillation: Secondary | ICD-10-CM

## 2021-04-14 DIAGNOSIS — I4819 Other persistent atrial fibrillation: Secondary | ICD-10-CM | POA: Diagnosis not present

## 2021-04-14 DIAGNOSIS — D6869 Other thrombophilia: Secondary | ICD-10-CM | POA: Diagnosis not present

## 2021-04-14 MED ORDER — FUROSEMIDE 40 MG PO TABS: 60.0000 mg | ORAL_TABLET | Freq: Every day | ORAL | 3 refills | Status: DC

## 2021-04-14 NOTE — Telephone Encounter (Signed)
Picked ablation date and set up preop labs. Will meet with the patient on lab day to give CT and ablation instructions.Patient agreeable and verbalized understanding.

## 2021-04-14 NOTE — Patient Instructions (Addendum)
Medication Instructions:  Increase Lasix to 60mg  daily.  Continue all other medications.    Labwork: CBC, BMET, ProBNP - orders given today.  Office will contact with results via phone or letter.    Testing/Procedures: Your physician has requested that you have an echocardiogram. Echocardiography is a painless test that uses sound waves to create images of your heart. It provides your doctor with information about the size and shape of your heart and how well your heart's chambers and valves are working. This procedure takes approximately one hour. There are no restrictions for this procedure. Your physician has recommended that you have an afib ablation. Catheter ablation is a medical procedure used to treat some cardiac arrhythmias (irregular heartbeats). During catheter ablation, a long, thin, flexible tube is put into a blood vessel in your groin (upper thigh), or neck. This tube is called an ablation catheter. It is then guided to your heart through the blood vessel. Radio frequency waves destroy small areas of heart tissue where abnormal heartbeats may cause an arrhythmia to start. Please see the instruction sheet given to you today. Otila Kluver from [facility] office will call give you date & time.   Follow-Up: To be determined after procedure   Any Other Special Instructions Will Be Listed Below (If Applicable).  If you need a refill on your cardiac medications before your next appointment, please call your pharmacy.

## 2021-04-14 NOTE — Progress Notes (Signed)
PCP: [name], MD Primary Cardiologist: Dr [name] Primary EP: Dr Genevie Ann is a 80 y.o. male who presents today for routine electrophysiology followup.  I have not seen him in quite some time.  Unfortunately, he has returned to persistent afib.  Cardioversion has not been pursued.  He is rate controlled but has developed worsening CHF. + SOB with minimal activity and worsening of edema.  He has increased his lasix to 60mg  without much benefit.  + fatigue.  Today, he denies symptoms of palpitations, chest pain,  dizziness, presyncope, or syncope.  The patient is otherwise without complaint today.   Past Medical History:  Diagnosis Date   Arthritis    Atrial fibrillation (HCC)    CAD (coronary artery disease)    a. Cath 03/17/15 showing 100% ostial D1, 50% prox LAD to mid LAD, 40% RPDA stenosis. Med rx. // Myoview 01/2020: EF 31 diffuse perfusion defect without reversibility (suspect artifact); reviewed with Dr. Aggie Cosier study felt to be low risk   Cataract    Mixed form OD   Chronic diastolic CHF (congestive heart failure) (HCC)    Diabetic retinopathy ([place])    NPDR OU   Essential hypertension    Glaucoma    POAG OU   History of gout    Hyperlipidemia    Hypertensive retinopathy    OU   Kidney stones    Melanoma of neck (HCC)    NICM (nonischemic cardiomyopathy) ([place])    OSA on CPAP 2012   Prostate cancer ([place])    Type II diabetes mellitus ([place])    Past Surgical History:  Procedure Laterality Date   ABDOMINAL HERNIA REPAIR     w/mesh   CARDIAC CATHETERIZATION N/A 03/17/2015   Procedure: Left Heart Cath and Coronary Angiography;  Surgeon: Lorretta Harp, MD;  Location: [facility];  Service: Cardiovascular;  Laterality: N/A;   carotid doppler  09/17/2008   rigt and left ICAs 0-49%;mildly  abnormal   CATARACT EXTRACTION Left 2020   Dr. Kathlen Mody   COLONOSCOPY N/A 05/16/2017   Procedure: COLONOSCOPY;  Surgeon: Rogene Houston, MD;  [facility];  Service: Endoscopy;  Laterality: N/A;  [place]  05/25/2009   EF 50-55%,LA mildly dilated, LV function normal   ELECTROPHYSIOLOGIC STUDY N/A 04/05/2015   Procedure: Cardioversion;  Surgeon: Sanda Klein, MD;  Location: [facility];  Service: Cardiovascular;  Laterality: N/A;   ELECTROPHYSIOLOGIC STUDY N/A 09/06/2015   Procedure: Atrial Fibrillation Ablation;  Surgeon: Thompson Grayer, MD;  Location: [facility];  Service: Cardiovascular;  Laterality: N/A;   ELECTROPHYSIOLOGIC STUDY N/A 07/12/2016   [facility] afib ablation by Dr Rayann Heman   EXCISIONAL HEMORRHOIDECTOMY     "inside and out"   EYE SURGERY Left 2020   Cat Sx - Dr. Kathlen Mody   FINE NEEDLE ASPIRATION Right    knee; "drew ~ 1 quart off"   [place] Right    "neck"   NM MYOCAR PERF WALL MOTION  02/21/2012   EF 61% ,EXERCISE 7 METS. exercise stopped due to wheezing and shortness of breathe   POLYPECTOMY  05/16/2017   Procedure: POLYPECTOMY;  Surgeon: Rogene Houston, MD;  [facility];  Service: Endoscopy;;  colon   PROSTATECTOMY     SHOULDER OPEN ROTATOR CUFF REPAIR Right X 2   TEE WITHOUT CARDIOVERSION N/A 09/05/2015   Procedure:  TRANSESOPHAGEAL ECHOCARDIOGRAM (TEE);  Surgeon: Sueanne Margarita, MD;  Location[facility] ENDOSCOPY;  Service: Cardiovascular;  Laterality: N/A;    ROS- all systems are reviewed and negatives except as per HPI above  Current Outpatient Medications  Medication Sig Dispense Refill   amLODipine (NORVASC) 10 MG tablet Take 10 mg by mouth daily.     Carboxymethylcellul-Glycerin (LUBRICATING EYE DROPS OP) Apply 1 drop to eye daily as needed (itching eyes).     carvedilol (COREG) 6.25 MG tablet Takes 1 tablet by mouth in the AM and 1 tablet by mouth in the PM     dorzolamide-timolol (COSOPT) 22.3-6.8 MG/ML ophthalmic solution Place 1 drop into both eyes 2 (two) times daily. 10 mL 6   ELIQUIS 5 MG TABS tablet  TAKE 1 TABLET TWICE A DAY 180 tablet 1   furosemide (LASIX) 40 MG tablet TAKE 1 TABLET BY MOUTH EVERY DAY 90 tablet 3   gemfibrozil (LOPID) 600 MG tablet Take 600 mg by mouth at bedtime.      glimepiride (AMARYL) 2 MG tablet Take 2 mg by mouth 2 (two) times daily.      Ibuprofen-diphenhydrAMINE Cit (ADVIL PM PO) Take by mouth at bedtime.     Insulin Glargine (BASAGLAR KWIKPEN) 100 UNIT/ML SOPN Inject 40 Units into the skin at bedtime.      lisinopril (ZESTRIL) 40 MG tablet TAKE 1 TABLET BY MOUTH EVERY DAY 90 tablet 3   Melatonin 10 MG TABS Take 1 tablet by mouth at bedtime.     metFORMIN (GLUCOPHAGE) 500 MG tablet Take by mouth.     metFORMIN (GLUCOPHAGE-XR) 500 MG 24 hr tablet Take 1,000 mg by mouth 2 (two) times daily.     pioglitazone (ACTOS) 15 MG tablet Take 15 mg by mouth every morning.     valACYclovir (VALTREX) 1000 MG tablet Take 1,000 mg by mouth daily.      lisinopril (ZESTRIL) 40 MG tablet Take 1 tablet by mouth daily.     No current facility-administered medications for this visit.    Physical Exam: Vitals:   04/14/21 1033  BP: 126/70  Pulse: 72  SpO2: 98%  Weight: 257 lb 6.4 oz (116.8 kg)  Height: 5\' 10"  (1.778 m)    GEN- The patient is overweight appearing, alert and oriented x 3 today.   Head- normocephalic, atraumatic Eyes-  Sclera clear, conjunctiva pink Ears- hearing intact Oropharynx- clear Lungs- + bibasilar rales, normal work of breathing Heart- irregular rate and rhythm, no murmurs, rubs or gallops, PMI not laterally displaced GI- soft, NT, ND, + BS Extremities- no clubbing, cyanosis, +2 edema  Wt Readings from Last 3 Encounters:  04/14/21 257 lb 6.4 oz (116.8 kg)  03/03/21 247 lb (112 kg)  02/02/21 242 lb (109.8 kg)   Echo 02/02/20- EF 55%, moderate LVH, LA 4.7cm   EKG tracing ordered today is personally reviewed and shows rate controlled afib (V rates 60s)  AF clinic notes, Dr Thera Flake notes, my prior notes, and Dr Chuck Hint notes are  reviewed  Assessment and Plan:  Persistent afib He did very well post ablation after failing tikosyn.  Now has recurrence of his afib. Chads2vasc score is 5.  He is on eliquis   Therapeutic strategies for afib including medicine (tikosyn, amiodarone) and repeat ablation were discussed in detail with the patient today. Risk, benefits, and alternatives to EP study and radiofrequency ablation for afib were also discussed in detail today. These risks include but are not limited to stroke, bleeding, vascular damage, tamponade,  perforation, damage to the esophagus, lungs, and other structures, pulmonary vein stenosis, worsening renal function, and death. The patient understands these risk and wishes to proceed.  We will therefore proceed with catheter ablation at the next available time.  Carto, ICE, anesthesia are requested for the procedure.  Will also obtain cardiac CT prior to the procedure to exclude LAA thrombus and further evaluate atrial anatomy. Cbc and bmet today  2. Acute on chronic diastolic dysfunction EF previously normal Will repeat echo Currently NYHA Class III with SOB and edema Continue increased lasix 60mg  daily Check pro BNP and bmet today He would be very interested in Alleviate HF trial if he is a candidate  3. CAD No ischemic symptoms No changes  4. OSA  Compliance with CPAP advised  5. HTN Continue lisinopril 40mg  dialy Increase lasix Echo, bmet I suspect that his BLE edema is worsened by norvasc.  Eventually, we may need to adjust this  6. Obesity Body mass index is 36.93 kg/m. Unfortunately, lifestyle modification is not ideal   Risks, benefits and potential toxicities for medications prescribed and/or refilled reviewed with patient today.   Thompson Grayer MD, [facility] 04/14/2021 10:43 AM

## 2021-04-14 NOTE — Telephone Encounter (Signed)
-----   Message from Thompson Grayer, MD sent at 04/14/2021 11:05 AM EDT ----- Please schedule afib ablation C/I/A and cardiac CT  Ok to be third case of the day.

## 2021-04-14 NOTE — Addendum Note (Signed)
Addended by: Levonne Hubert on: 04/14/2021 12:21 PM   Modules accepted: Orders

## 2021-04-15 LAB — CBC
HCT: 36.8 % — ABNORMAL LOW (ref 38.5–50.0)
Hemoglobin: 12 g/dL — ABNORMAL LOW (ref 13.2–17.1)
MCH: 29.8 pg (ref 27.0–33.0)
MCHC: 32.6 g/dL (ref 32.0–36.0)
MCV: 91.3 fL (ref 80.0–100.0)
MPV: 11.4 fL (ref 7.5–12.5)
Platelets: 205 10*3/uL (ref 140–400)
RBC: 4.03 10*6/uL — ABNORMAL LOW (ref 4.20–5.80)
RDW: 14 % (ref 11.0–15.0)
WBC: 6.5 10*3/uL (ref 3.8–10.8)

## 2021-04-15 LAB — BASIC METABOLIC PANEL WITH GFR
BUN/Creatinine Ratio: 17 (calc) (ref 6–22)
BUN: 21 mg/dL (ref 7–25)
CO2: 32 mmol/L (ref 20–32)
Calcium: 9.1 mg/dL (ref 8.6–10.3)
Chloride: 101 mmol/L (ref 98–110)
Creat: 1.27 mg/dL — ABNORMAL HIGH (ref 0.70–1.18)
GFR, Est African American: 62 mL/min/{1.73_m2} (ref >=60)
GFR, Est Non African American: 54 mL/min/{1.73_m2} — ABNORMAL LOW (ref >=60)
Glucose, Bld: 194 mg/dL — ABNORMAL HIGH (ref 65–139)
Potassium: 4.3 mmol/L (ref 3.5–5.3)
Sodium: 140 mmol/L (ref 135–146)

## 2021-04-19 LAB — CARDIO IQ® NT PROBNP: NT PROBNP: 628 pg/mL — ABNORMAL HIGH (ref <253)

## 2021-04-21 ENCOUNTER — Other Ambulatory Visit: Payer: Self-pay | Admitting: *Deleted

## 2021-04-21 DIAGNOSIS — I480 Paroxysmal atrial fibrillation: Secondary | ICD-10-CM

## 2021-04-21 MED ORDER — APIXABAN 5 MG PO TABS: 5.0000 mg | ORAL_TABLET | Freq: Two times a day (BID) | ORAL | 1 refills | Status: DC

## 2021-04-21 NOTE — Telephone Encounter (Signed)
Prescription refill request for Eliquis received. Indication: Atrial fib Last office visit: 04/14/21  Allred MD Scr: 1.27 on 04/14/21 Age: 80 Weight: 116.8kg  Based on above findings Eliquis 5mg  twice daily is the appropriate dose.  Refill approved.

## 2021-04-25 ENCOUNTER — Encounter (INDEPENDENT_AMBULATORY_CARE_PROVIDER_SITE_OTHER): Payer: Self-pay | Admitting: Ophthalmology

## 2021-04-25 ENCOUNTER — Ambulatory Visit (INDEPENDENT_AMBULATORY_CARE_PROVIDER_SITE_OTHER): Payer: Medicare Other | Admitting: Ophthalmology

## 2021-04-25 ENCOUNTER — Other Ambulatory Visit: Payer: Self-pay

## 2021-04-25 DIAGNOSIS — I10 Essential (primary) hypertension: Secondary | ICD-10-CM

## 2021-04-25 DIAGNOSIS — Z8619 Personal history of other infectious and parasitic diseases: Secondary | ICD-10-CM

## 2021-04-25 DIAGNOSIS — H35033 Hypertensive retinopathy, bilateral: Secondary | ICD-10-CM | POA: Diagnosis not present

## 2021-04-25 DIAGNOSIS — Z961 Presence of intraocular lens: Secondary | ICD-10-CM

## 2021-04-25 DIAGNOSIS — H3581 Retinal edema: Secondary | ICD-10-CM

## 2021-04-25 DIAGNOSIS — H25811 Combined forms of age-related cataract, right eye: Secondary | ICD-10-CM | POA: Diagnosis not present

## 2021-04-25 DIAGNOSIS — E113213 Type 2 diabetes mellitus with mild nonproliferative diabetic retinopathy with macular edema, bilateral: Secondary | ICD-10-CM | POA: Diagnosis not present

## 2021-04-25 DIAGNOSIS — H401130 Primary open-angle glaucoma, bilateral, stage unspecified: Secondary | ICD-10-CM

## 2021-04-25 DIAGNOSIS — H18523 Epithelial (juvenile) corneal dystrophy, bilateral: Secondary | ICD-10-CM

## 2021-04-25 MED ORDER — AFLIBERCEPT 2MG/0.05ML IZ SOLN FOR KALEIDOSCOPE
2.0000 mg | INTRAVITREAL | Status: AC | PRN
  Administered 2021-04-25: 2 mg via INTRAVITREAL

## 2021-04-25 NOTE — Progress Notes (Signed)
[facility] Note  04/25/2021     CHIEF COMPLAINT Patient presents for Retina Follow Up   HISTORY OF PRESENT ILLNESS: Lucas Dudley is a 80 y.o. male who presents to the clinic today for:    HPI     Retina Follow Up   Patient presents with  Diabetic Retinopathy.  In both eyes.  This started weeks ago.  Severity is moderate.  Duration of weeks.  Since onset it is stable.  I, the attending physician,  performed the HPI with the patient and updated documentation appropriately.        Comments   Pt states vision is the same OU.  Pt denies eye pain or discomfort and denies any new or worsening floaters or fol OU.      Last edited by Bernarda Caffey, MD on 04/25/2021 10:44 PM.    Referring physician: Hortencia Pilar, MD [place],  [place] [address]  HISTORICAL INFORMATION:   Selected notes from the MEDICAL RECORD NUMBER Referred by Dr. Kathlen Mody for DEE   CURRENT MEDICATIONS: Current Outpatient Medications (Ophthalmic Drugs)  Medication Sig   Carboxymethylcellul-Glycerin (LUBRICATING EYE DROPS OP) Apply 1 drop to eye daily as needed (itching eyes).   dorzolamide-timolol (COSOPT) 22.3-6.8 MG/ML ophthalmic solution Place 1 drop into both eyes 2 (two) times daily.   No current facility-administered medications for this visit. (Ophthalmic Drugs)   Current Outpatient Medications (Other)  Medication Sig   amLODipine (NORVASC) 10 MG tablet Take 10 mg by mouth daily.   apixaban (ELIQUIS) 5 MG TABS tablet Take 1 tablet (5 mg total) by mouth 2 (two) times daily.   carvedilol (COREG) 6.25 MG tablet Takes 1 tablet by mouth in the AM and 1 tablet by mouth in the PM   furosemide (LASIX) 40 MG tablet Take 1.5 tablets (60 mg total) by mouth daily.   gemfibrozil (LOPID) 600 MG tablet Take 600 mg by mouth at bedtime.    glimepiride (AMARYL) 2 MG tablet Take 2 mg by mouth 2 (two) times daily.    Ibuprofen-diphenhydrAMINE Cit (ADVIL PM PO)  Take by mouth at bedtime.   Insulin Glargine (BASAGLAR KWIKPEN) 100 UNIT/ML SOPN Inject 40 Units into the skin at bedtime.    lisinopril (ZESTRIL) 40 MG tablet Take 1 tablet by mouth daily.   Melatonin 10 MG TABS Take 1 tablet by mouth at bedtime.   metFORMIN (GLUCOPHAGE-XR) 500 MG 24 hr tablet Take 1,000 mg by mouth 2 (two) times daily.   pioglitazone (ACTOS) 15 MG tablet Take 15 mg by mouth every morning.   valACYclovir (VALTREX) 1000 MG tablet Take 1,000 mg by mouth daily.    No current facility-administered medications for this visit. (Other)   REVIEW OF SYSTEMS: ROS   Positive for: Gastrointestinal, Endocrine, Cardiovascular, Eyes, Respiratory Negative for: Constitutional, Neurological, Skin, Genitourinary, Musculoskeletal, HENT, Psychiatric, Allergic/Imm, Heme/Lymph Last edited by Doneen Poisson on 04/25/2021  1:49 PM.      ALLERGIES Allergies  Allergen Reactions   Tape Rash and Other (See Comments)    Causes skin redness, Use paper tape only.   PAST MEDICAL HISTORY Past Medical History:  Diagnosis Date   Arthritis    Atrial fibrillation (HCC)    CAD (coronary artery disease)    a. Cath 03/17/15 showing 100% ostial D1, 50% prox LAD to mid LAD, 40% RPDA stenosis. Med rx. // Myoview 01/2020: EF 31 diffuse perfusion defect without reversibility (suspect artifact); reviewed with Dr. Aggie Cosier  study felt to be low risk   Cataract    Mixed form OD   Chronic diastolic CHF (congestive heart failure) (HCC)    Diabetic retinopathy ([place])    NPDR OU   Essential hypertension    Glaucoma    POAG OU   History of gout    Hyperlipidemia    Hypertensive retinopathy    OU   Kidney stones    Melanoma of neck (HCC)    NICM (nonischemic cardiomyopathy) ([place])    OSA on CPAP 2012   Prostate cancer ([place])    Type II diabetes mellitus ([place])    Past Surgical History:  Procedure Laterality Date   ABDOMINAL HERNIA REPAIR     w/mesh   CARDIAC CATHETERIZATION N/A 03/17/2015    Procedure: Left Heart Cath and Coronary Angiography;  Surgeon: Lorretta Harp, MD;  Location: [facility];  Service: Cardiovascular;  Laterality: N/A;   carotid doppler  09/17/2008   rigt and left ICAs 0-49%;mildly  abnormal   CATARACT EXTRACTION Left 2020   Dr. Kathlen Mody   COLONOSCOPY N/A 05/16/2017   Procedure: COLONOSCOPY;  Surgeon: Rogene Houston, MD;  [facility];  Service: Endoscopy;  Laterality: N/A;  [place]  05/25/2009   EF 50-55%,LA mildly dilated, LV function normal   ELECTROPHYSIOLOGIC STUDY N/A 04/05/2015   Procedure: Cardioversion;  Surgeon: Sanda Klein, MD;  Location: [facility];  Service: Cardiovascular;  Laterality: N/A;   ELECTROPHYSIOLOGIC STUDY N/A 09/06/2015   Procedure: Atrial Fibrillation Ablation;  Surgeon: Thompson Grayer, MD;  Location: [facility];  Service: Cardiovascular;  Laterality: N/A;   ELECTROPHYSIOLOGIC STUDY N/A 07/12/2016   [facility] afib ablation by Dr Rayann Heman   EXCISIONAL HEMORRHOIDECTOMY     "inside and out"   EYE SURGERY Left 2020   Cat Sx - Dr. Kathlen Mody   FINE NEEDLE ASPIRATION Right    knee; "drew ~ 1 quart off"   [place] Right    "neck"   NM MYOCAR PERF WALL MOTION  02/21/2012   EF 61% ,EXERCISE 7 METS. exercise stopped due to wheezing and shortness of breathe   POLYPECTOMY  05/16/2017   Procedure: POLYPECTOMY;  Surgeon: Rogene Houston, MD;  [facility];  Service: Endoscopy;;  colon   PROSTATECTOMY     SHOULDER OPEN ROTATOR CUFF REPAIR Right X 2   TEE WITHOUT CARDIOVERSION N/A 09/05/2015   Procedure: TRANSESOPHAGEAL ECHOCARDIOGRAM (TEE);  Surgeon: Sueanne Margarita, MD;  Location: [facility] ENDOSCOPY;  Service: Cardiovascular;  Laterality: N/A;   FAMILY HISTORY Family History  Problem Relation Age of Onset   Heart disease Mother    Lung cancer Mother    Heart attack Mother 44   Stroke Brother    Healthy Daughter    Diabetes  Father    Heart disease Father    SOCIAL HISTORY Social History   Tobacco Use   Smoking status: Former    Packs/day: 2.00    Years: 27.00    Pack years: 54.00    Types: Cigarettes    Quit date: 10/31/1983    Years since quitting: 37.5   Smokeless tobacco: Never  Vaping Use   Vaping Use: Never used  Substance Use Topics   Alcohol use: No    Alcohol/week: 0.0 standard drinks    Comment: "used to drink; stopped ~ 2008"   Drug use: No  OPHTHALMIC EXAM:  Base Eye Exam     Visual Acuity (Snellen - Linear)       Right Left   Dist cc 20/60 -2 20/60 -2   Dist ph cc 20/50 -2 NI    Correction: Glasses         Tonometry (Tonopen, 1:56 PM)       Right Left   Pressure 19 16         Pupils       Dark Light Shape React APD   Right 2 1 Round Minimal 0   Left 2 1 Round Minimal 0         Visual Fields       Left Right    Full Full         Extraocular Movement       Right Left    Full Full         Neuro/Psych     Oriented x3: Yes   Mood/Affect: Normal         Dilation     Both eyes: 1.0% Mydriacyl, 2.5% Phenylephrine @ 1:59 PM           Slit Lamp and Fundus Exam     Slit Lamp Exam       Right Left   Lids/Lashes Dermato, mild MGD Dermato, mild MGD   Conjunctiva/Sclera Temporal pinguecula Temporal pinguecula   Cornea EBMD, mild haze, 1-2+ PEE, mild Debris in tear film EBMD, mild haze, trace PEE, well healed temporal cataract wounds, arcus   Anterior Chamber Deep and clear; narrow temporal angle Deep and clear   Iris Round and dilated, mild anterior bowing, No NVI Round and moderately dilated to 5.7mm   Lens 3+ NS with brunesence, 2-3+ cortical PCIOL, trace PCO - non central   Vitreous Synerisis Synerisis         Fundus Exam       Right Left   Disc Pink, sharp, +cupping, central pallor, Thin inferior rim Pink and sharp, +cupping, +PPA   C/D Ratio 0.7 0.6   Macula Flat, blunted foveal reflex, trace central cystic changes,  RPE mottling and clumping, no heme Flat, blunted foveal reflex, clusters of fine MA nasal and superior to fovea -- improved, +cystic changes -- slightly improved, focal exudates superior to fovea - improving   Vessels attenuated, Tortuous Attenuated, mild av crossing changes, mild tortuousity   Periphery Attached, Focal dot heme below nerve (0600) - improved, rare MA Attached.  No heme.           Refraction     Wearing Rx       Sphere Cylinder Axis Add   Right -0.75 +3.50 163 +2.50   Left -1.00 +1.50 015 +2.50            IMAGING AND PROCEDURES  Imaging and Procedures for 04/25/2021  OCT, Retina - OU - Both Eyes       Right Eye Quality was good. Central Foveal Thickness: 305. Progression has been stable. Findings include abnormal foveal contour, no SRF, vitreomacular adhesion , intraretinal fluid (Trace persistent cystic changes superior to fovea).   Left Eye Quality was good. Central Foveal Thickness: 375. Progression has improved. Findings include abnormal foveal contour, intraretinal fluid, intraretinal hyper-reflective material (Interval improvement in IRF/cystic changes; partial PVD).   Notes *Images captured and stored on drive  Diagnosis / Impression:  Mild DME OU (OS > OD) OD: Trace persistent cystic changes superior to fovea OS: Interval improvement in IRF/cystic changes;  partial PVD  Clinical management:  See below  Abbreviations: NFP - Normal foveal profile. CME - cystoid macular edema. PED - pigment epithelial detachment. IRF - intraretinal fluid. SRF - subretinal fluid. EZ - ellipsoid zone. ERM - epiretinal membrane. ORA - outer retinal atrophy. ORT - outer retinal tubulation. SRHM - subretinal hyper-reflective material. IRHM - intraretinal hyper-reflective material     Intravitreal Injection, Pharmacologic Agent - OS - Left Eye       Time Out 04/25/2021. 2:34 PM. Confirmed correct patient, procedure, site, and patient consented.   Anesthesia Topical  anesthesia was used. Anesthetic medications included Proparacaine 0.5%, Lidocaine 2%.   Procedure Preparation included 5% betadine to ocular surface, eyelid speculum. A (32g) needle was used.   Injection: 2 mg aflibercept 2 MG/0.05ML   Route: Intravitreal, Site: Left Eye   NDC: [id number], Lot: [id number], Expiration date: 01/13/2022, Waste: 0.05 mL   Post-op Post injection exam found visual acuity of at least counting fingers. The patient tolerated the procedure well. There were no complications. The patient received written and verbal post procedure care education. Post injection medications were not given.            ASSESSMENT/PLAN:    ICD-10-CM   1. 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, associated with type 2 diabetes mellitus (HCC)  K53.9767 Intravitreal Injection, Pharmacologic Agent - OS - Left Eye    aflibercept (EYLEA) SOLN 2 mg    2. Retinal edema  H35.81 OCT, Retina - OU - Both Eyes    3. Essential hypertension  I10     4. Hypertensive retinopathy of both eyes  H35.033     5. Combined forms of age-related cataract of right eye  H25.811     6. 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 History of herpes zoster of eye  Z86.19     9. Primary open angle glaucoma of both eyes, unspecified glaucoma stage  H40.1130      1,2. 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 Single focal MA superior to fovea.                                  OS: Perifoveal Mas w/late leakage - OCT shows diabetic macular edema OU (OS>OD) - s/p IVA OS # 1(07.26.21), #2 (08.24.21), #3 (09.21.21), #4 (10.22.21) -- IVA resistance - s/p IVE OS #1 (11.22.21), #2 (12.21.21), #3 (01.20.22), #4 (02.18.22), #5 (03.18.22), #6 (04.15.22), #7 (05.10.22) -- sample, #8 (06.08.22) - BCVA OS improved to 20/30 from 20/40  - OCT shows  OD: Trace persistent  cystic changes ; OS: Interval improvement in IRF/cystic changes at 5 wks - recommend IVE OS #9 today, 07.12.22 - pt wishes to proceed with injection - RBA of procedure discussed, questions answered - IVA informed consent obtained and signed, 07.26.21 (OS) - IVE informed consent obtained and signed, 11.22.21 (OS) - see procedure note - Good Days approved Eylea until 10/14/2021. - [employer] approved Eylea until 09/04/2021 - f/u in 5 wks -- DFE/OCT, possible injection  3,4. Hypertensive retinopathy OU - discussed importance of tight BP control - monitor  5. Mixed Cataract OD - The symptoms of cataract, surgical options, and treatments and risks were discussed with patient. - discussed diagnosis and progression - under the expert management of Dr.  Weaver  6. Pseudophakia OS  - s/p CE/IOL OS (2020, Dr. Kathlen Mody)  - IOL in good position, doing well  - monitor  7. 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 Roby Lofts at [facility], who recommended SuperK prior to [place]  - saw Dr. Susa Simmonds 6.6.22 -- procedure scheduled for August  8. History of herpes zoster/iritis OS  - on valrex 1g daily---maintenance   9. POAG OU  - under the expert management of Dr. Kathlen Mody  - IOP 19,16 today  - currently on cosopt BID OU   Ophthalmic Meds Ordered this visit:  Meds ordered this encounter  Medications   aflibercept (EYLEA) SOLN 2 mg       Return in about 5 weeks (around 05/30/2021) for f/u NPDR OU, DFE, OCT.  There are no Patient Instructions on file for this visit.  This document serves as a record of services personally performed by Gardiner Sleeper, MD, PhD. It was created on their behalf by Estill Bakes, COT an ophthalmic technician. The creation of this record is the provider's dictation and/or activities during the visit.    Electronically signed by: Estill Bakes, COT 7.12.22 @ 10:53 PM   This document serves as a record of services personally performed  by Gardiner Sleeper, MD, PhD. It was created on their behalf by San Jetty. Owens Shark, OA an ophthalmic technician. The creation of this record is the provider's dictation and/or activities during the visit.    Electronically signed by: San Jetty. Marguerita Merles 07.12.2022 10:53 PM  Gardiner Sleeper, M.D., Ph.D. Diseases & Surgery of the Retina and Vitreous [facility]  I have reviewed the above documentation for accuracy and completeness, and I agree with the above. Gardiner Sleeper, M.D., Ph.D. 04/25/21 10:53 PM  Abbreviations: M myopia (nearsighted); A astigmatism; H hyperopia (farsighted); P presbyopia; Mrx spectacle prescription;  CTL contact lenses; OD right eye; OS left eye; OU both eyes  XT exotropia; ET esotropia; PEK punctate epithelial keratitis; PEE punctate epithelial erosions; DES dry eye syndrome; MGD meibomian gland dysfunction; ATs artificial tears; PFAT's preservative free artificial tears; [place] nuclear sclerotic cataract; PSC posterior subcapsular cataract; ERM epi-retinal membrane; PVD posterior vitreous detachment; RD retinal detachment; DM diabetes mellitus; DR diabetic retinopathy; NPDR non-proliferative diabetic retinopathy; PDR proliferative diabetic retinopathy; CSME clinically significant macular edema; DME diabetic macular edema; dbh dot blot hemorrhages; CWS cotton wool spot; POAG primary open angle glaucoma; C/D cup-to-disc ratio; HVF humphrey visual field; GVF goldmann visual field; OCT optical coherence tomography; IOP intraocular pressure; BRVO Branch retinal vein occlusion; CRVO central retinal vein occlusion; CRAO central retinal artery occlusion; BRAO branch retinal artery occlusion; RT retinal tear; SB scleral buckle; PPV pars plana vitrectomy; VH Vitreous hemorrhage; PRP panretinal laser photocoagulation; IVK intravitreal kenalog; VMT vitreomacular traction; MH Macular hole;  NVD neovascularization of the disc; NVE neovascularization elsewhere; AREDS age  related eye disease study; ARMD age related macular degeneration; POAG primary open angle glaucoma; EBMD epithelial/anterior basement membrane dystrophy; ACIOL anterior chamber intraocular lens; IOL intraocular lens; PCIOL posterior chamber intraocular lens; Phaco/IOL phacoemulsification with intraocular lens placement; [place] photorefractive keratectomy; LASIK laser assisted in situ keratomileusis; HTN hypertension; DM diabetes mellitus; COPD chronic obstructive pulmonary disease

## 2021-04-27 ENCOUNTER — Ambulatory Visit (HOSPITAL_COMMUNITY): Admission: RE | Admit: 2021-04-27 | Payer: Medicare Other | Source: Ambulatory Visit

## 2021-04-27 NOTE — Telephone Encounter (Signed)
Left message to call back  

## 2021-04-29 ENCOUNTER — Other Ambulatory Visit: Payer: Self-pay | Admitting: Internal Medicine

## 2021-05-01 ENCOUNTER — Telehealth: Payer: Self-pay | Admitting: Cardiology

## 2021-05-01 ENCOUNTER — Telehealth: Payer: Self-pay | Admitting: Internal Medicine

## 2021-05-01 NOTE — Telephone Encounter (Signed)
See 7/1 phone note

## 2021-05-01 NOTE — Telephone Encounter (Signed)
Moved lab date to August 4. That way I will be able to meet with him in office to give instructions.

## 2021-05-01 NOTE — Telephone Encounter (Signed)
Lucas Dudley is calling in regards to this requesting she be called back with the recommendations as to what the Doctor advises due to her father forgetting things. Also requested Dr. Rayann Heman give his recommendation as well. Please advise.

## 2021-05-01 NOTE — Telephone Encounter (Signed)
Patient was returning call. Please advise ?

## 2021-05-01 NOTE — Telephone Encounter (Signed)
Left message to call back  

## 2021-05-01 NOTE — Telephone Encounter (Signed)
Reports SOB is worse with activity Reports swelling in legs and feet are about the same Reports when he wakes up in the morning, his legs and feet are not swollen Does not weigh at home  Denies chest pain or dizziness Currently taking lasix 60 mg daily Advised to weigh daily, keep legs elevated Advised message would be sent to provider and if he develops worsening SOB to go to the ED for an evaluation Verbalized understanding of plan

## 2021-05-01 NOTE — Telephone Encounter (Signed)
Our system was down this morning so we were unable to get into Epic. [name] called the office reporting symptoms of swollen legs, SOB, not able to eat and AFIB. I called the patient back once our system came back on to get more information but LVM for him to call back. Just FYI

## 2021-05-02 MED ORDER — AMLODIPINE BESYLATE 2.5 MG PO TABS: 2.5000 mg | ORAL_TABLET | Freq: Every day | ORAL | 1 refills | Status: DC

## 2021-05-02 NOTE — Telephone Encounter (Signed)
Patient's daughter [name] informed and verbalized understanding of plan. She will communicate plan to patient.

## 2021-05-02 NOTE — Telephone Encounter (Signed)
Satira Sark, MD  Merlene Laughter, RN; Thompson Grayer, MD Caller: Unspecified Lucas Dudley, 10:58 AM) Chart reviewed, patient seen recently by Dr. Rayann Heman on July 1.  Call noted regarding leg swelling.  Lasix was already increased to 60 mg daily by Dr. Rayann Heman, plan also for atrial fibrillation ablation which may help heart failure symptoms as well.  Might consider reducing Norvasc to 2.5 mg daily in case this is contributing to leg swelling, presuming his blood pressure is reasonably controlled.

## 2021-05-02 NOTE — Telephone Encounter (Signed)
Advised that there was not a sooner opening for ablation at this time. I would contact them if anything changed.  Verbalized understanding.

## 2021-05-04 ENCOUNTER — Encounter: Payer: Self-pay | Admitting: *Deleted

## 2021-05-10 ENCOUNTER — Ambulatory Visit (HOSPITAL_COMMUNITY)
Admission: RE | Admit: 2021-05-10 | Discharge: 2021-05-10 | Disposition: A | Discharge status: Home / Self Care | Payer: Medicare Other | Source: Ambulatory Visit | Attending: Internal Medicine | Admitting: Internal Medicine

## 2021-05-10 ENCOUNTER — Other Ambulatory Visit: Payer: Self-pay

## 2021-05-10 DIAGNOSIS — R0602 Shortness of breath: Secondary | ICD-10-CM | POA: Insufficient documentation

## 2021-05-10 LAB — ECHOCARDIOGRAM COMPLETE
AR max vel: 1.93 cm2
AV Area VTI: 1.94 cm2
AV Area mean vel: 1.81 cm2
AV Mean grad: 3.2 mmHg
AV Peak grad: 6 mmHg
Ao pk vel: 1.22 m/s
Area-P 1/2: 3.85 cm2
S' Lateral: 3.3 cm

## 2021-05-10 NOTE — Progress Notes (Signed)
*  PRELIMINARY RESULTS* Echocardiogram 2D Echocardiogram has been performed.  Samuel Germany 05/10/2021, 12:46 PM

## 2021-05-11 ENCOUNTER — Encounter: Payer: Self-pay | Admitting: *Deleted

## 2021-05-16 ENCOUNTER — Other Ambulatory Visit: Payer: Medicare Other

## 2021-05-16 ENCOUNTER — Other Ambulatory Visit: Payer: Medicare Other | Admitting: *Deleted

## 2021-05-16 ENCOUNTER — Other Ambulatory Visit: Payer: Self-pay

## 2021-05-16 DIAGNOSIS — I4891 Unspecified atrial fibrillation: Secondary | ICD-10-CM

## 2021-05-16 LAB — CBC WITH DIFFERENTIAL/PLATELET
Basophils Absolute: 0 10*3/uL (ref 0.0–0.2)
Basos: 0 %
EOS (ABSOLUTE): 0.1 10*3/uL (ref 0.0–0.4)
Eos: 2 %
Hematocrit: 34.5 % — ABNORMAL LOW (ref 37.5–51.0)
Hemoglobin: 11.5 g/dL — ABNORMAL LOW (ref 13.0–17.7)
Lymphocytes Absolute: 1.2 10*3/uL (ref 0.7–3.1)
Lymphs: 18 %
MCH: 29.2 pg (ref 26.6–33.0)
MCHC: 33.3 g/dL (ref 31.5–35.7)
MCV: 88 fL (ref 79–97)
Monocytes Absolute: 0.7 10*3/uL (ref 0.1–0.9)
Monocytes: 10 %
Neutrophils Absolute: 4.9 10*3/uL (ref 1.4–7.0)
Neutrophils: 70 %
Platelets: 206 10*3/uL (ref 150–450)
RBC: 3.94 x10E6/uL — ABNORMAL LOW (ref 4.14–5.80)
RDW: 15.8 % — ABNORMAL HIGH (ref 11.6–15.4)
WBC: 7 10*3/uL (ref 3.4–10.8)

## 2021-05-16 LAB — BASIC METABOLIC PANEL
BUN/Creatinine Ratio: 19 (ref 10–24)
BUN: 19 mg/dL (ref 8–27)
CO2: 33 mmol/L — ABNORMAL HIGH (ref 20–29)
Calcium: 8.8 mg/dL (ref 8.6–10.2)
Chloride: 101 mmol/L (ref 96–106)
Creatinine, Ser: 1.02 mg/dL (ref 0.76–1.27)
Glucose: 124 mg/dL — ABNORMAL HIGH (ref 65–99)
Potassium: 3.8 mmol/L (ref 3.5–5.2)
Sodium: 140 mmol/L (ref 134–144)
eGFR: 75 mL/min/{1.73_m2} (ref >59)

## 2021-05-18 ENCOUNTER — Other Ambulatory Visit: Payer: Medicare Other

## 2021-05-25 NOTE — Progress Notes (Signed)
[facility] Note  05/26/2021     CHIEF COMPLAINT Patient presents for Retina Follow Up   HISTORY OF PRESENT ILLNESS: Lucas Dudley is a 80 y.o. male who presents to the clinic today for:    HPI     Retina Follow Up   Patient presents with  Diabetic Retinopathy.  In both eyes.  This started 5 weeks ago.  I, the attending physician,  performed the HPI with the patient and updated documentation appropriately.        Comments   Patient here for 5 weeks retina follow up for NPDR OU. Patient states vision not good. Everything blurred after 5 feet. No eye pain.       Last edited by Bernarda Caffey, MD on 05/28/2021 12:45 AM.    Patient is scheduled for corneal surgery OD 08.15.22 w/ [facility]   Referring physician: [name], MD [place],  [place] [address]  HISTORICAL INFORMATION:   Selected notes from the MEDICAL RECORD NUMBER Referred by Dr. Kathlen Mody for DEE   CURRENT MEDICATIONS: Current Outpatient Medications (Ophthalmic Drugs)  Medication Sig   Carboxymethylcellul-Glycerin (LUBRICATING EYE DROPS OP) Apply 1 drop to eye daily as needed (itching eyes).   dorzolamide-timolol (COSOPT) 22.3-6.8 MG/ML ophthalmic solution Place 1 drop into both eyes 2 (two) times daily.   No current facility-administered medications for this visit. (Ophthalmic Drugs)   Current Outpatient Medications (Other)  Medication Sig   amLODipine (NORVASC) 2.5 MG tablet Take 1 tablet (2.5 mg total) by mouth daily.   apixaban (ELIQUIS) 5 MG TABS tablet Take 1 tablet (5 mg total) by mouth 2 (two) times daily.   carvedilol (COREG) 6.25 MG tablet Takes 1 tablet by mouth in the AM and 1 tablet by mouth in the PM   furosemide (LASIX) 40 MG tablet Take 1.5 tablets (60 mg total) by mouth daily.   gemfibrozil (LOPID) 600 MG tablet Take 600 mg by mouth at bedtime.    glimepiride (AMARYL) 2 MG tablet Take 2 mg by mouth 2 (two) times daily.     Ibuprofen-diphenhydrAMINE Cit (ADVIL PM PO) Take by mouth at bedtime.   Insulin Glargine (BASAGLAR KWIKPEN) 100 UNIT/ML SOPN Inject 40 Units into the skin at bedtime.    lisinopril (ZESTRIL) 40 MG tablet Take 1 tablet by mouth daily.   Melatonin 10 MG TABS Take 1 tablet by mouth at bedtime.   metFORMIN (GLUCOPHAGE-XR) 500 MG 24 hr tablet Take 1,000 mg by mouth 2 (two) times daily.   pioglitazone (ACTOS) 15 MG tablet Take 15 mg by mouth every morning.   valACYclovir (VALTREX) 1000 MG tablet Take 1,000 mg by mouth daily.    No current facility-administered medications for this visit. (Other)   REVIEW OF SYSTEMS: ROS   Positive for: Gastrointestinal, Endocrine, Cardiovascular, Eyes, Respiratory Negative for: Constitutional, Neurological, Skin, Genitourinary, Musculoskeletal, HENT, Psychiatric, Allergic/Imm, Heme/Lymph Last edited by Theodore Demark, COA on 05/26/2021  3:05 PM.     ALLERGIES Allergies  Allergen Reactions   Tape Rash and Other (See Comments)    Causes skin redness, Use paper tape only.   PAST MEDICAL HISTORY Past Medical History:  Diagnosis Date   Arthritis    Atrial fibrillation (HCC)    CAD (coronary artery disease)    a. Cath 03/17/15 showing 100% ostial D1, 50% prox LAD to mid LAD, 40% RPDA stenosis. Med rx. // Myoview 01/2020: EF 31 diffuse perfusion defect without reversibility (suspect artifact); reviewed with Dr.  Nelson-overall study felt to be low risk   Cataract    Mixed form OD   Chronic diastolic CHF (congestive heart failure) (HCC)    Diabetic retinopathy (HCC)    NPDR OU   Essential hypertension    Glaucoma    POAG OU   History of gout    Hyperlipidemia    Hypertensive retinopathy    OU   Kidney stones    Melanoma of neck (HCC)    NICM (nonischemic cardiomyopathy) ([place])    OSA on CPAP 2012   Prostate cancer ([place])    Type II diabetes mellitus ([place])    Past Surgical History:  Procedure Laterality Date   ABDOMINAL HERNIA REPAIR     w/mesh    CARDIAC CATHETERIZATION N/A 03/17/2015   Procedure: Left Heart Cath and Coronary Angiography;  Surgeon: Lorretta Harp, MD;  Location: [facility];  Service: Cardiovascular;  Laterality: N/A;   carotid doppler  09/17/2008   rigt and left ICAs 0-49%;mildly  abnormal   CATARACT EXTRACTION Left 2020   Dr. Kathlen Mody   COLONOSCOPY N/A 05/16/2017   Procedure: COLONOSCOPY;  Surgeon: Rogene Houston, MD;  [facility];  Service: Endoscopy;  Laterality: N/A;  [place]  05/25/2009   EF 50-55%,LA mildly dilated, LV function normal   ELECTROPHYSIOLOGIC STUDY N/A 04/05/2015   Procedure: Cardioversion;  Surgeon: Sanda Klein, MD;  Location: [facility];  Service: Cardiovascular;  Laterality: N/A;   ELECTROPHYSIOLOGIC STUDY N/A 09/06/2015   Procedure: Atrial Fibrillation Ablation;  Surgeon: Thompson Grayer, MD;  Location: [facility];  Service: Cardiovascular;  Laterality: N/A;   ELECTROPHYSIOLOGIC STUDY N/A 07/12/2016   redo afib ablation by Dr Rayann Heman   EXCISIONAL HEMORRHOIDECTOMY     "inside and out"   EYE SURGERY Left 2020   Cat Sx - Dr. Kathlen Mody   FINE NEEDLE ASPIRATION Right    knee; "drew ~ 1 quart off"   Taylor Right    "neck"   NM MYOCAR PERF WALL MOTION  02/21/2012   EF 61% ,EXERCISE 7 METS. exercise stopped due to wheezing and shortness of breathe   POLYPECTOMY  05/16/2017   Procedure: POLYPECTOMY;  Surgeon: Rogene Houston, MD;  [facility];  Service: Endoscopy;;  colon   PROSTATECTOMY     SHOULDER OPEN ROTATOR CUFF REPAIR Right X 2   TEE WITHOUT CARDIOVERSION N/A 09/05/2015   Procedure: TRANSESOPHAGEAL ECHOCARDIOGRAM (TEE);  Surgeon: Sueanne Margarita, MD;  Location: [facility];  Service: Cardiovascular;  Laterality: N/A;   FAMILY HISTORY Family History  Problem Relation Age of Onset   Heart disease Mother    Lung cancer Mother    Heart attack Mother 67   Stroke  Brother    Healthy Daughter    Diabetes Father    Heart disease Father    SOCIAL HISTORY Social History   Tobacco Use   Smoking status: Former    Packs/day: 2.00    Years: 27.00    Pack years: 54.00    Types: Cigarettes    Quit date: 10/31/1983    Years since quitting: 37.6   Smokeless tobacco: Never  Vaping Use   Vaping Use: Never used  Substance Use Topics   Alcohol use: No    Alcohol/week: 0.0 standard drinks    Comment: "used to drink; stopped ~ 2008"   Drug use: No  OPHTHALMIC EXAM:  Base Eye Exam     Visual Acuity (Snellen - Linear)       Right Left   Dist cc 20/50 -2 20/50 -2   Dist ph cc 20/40 -2 20/50 +2    Correction: Glasses         Tonometry (Tonopen, 3:03 PM)       Right Left   Pressure 18 15         Pupils       Dark Light Shape React APD   Right 2 1 Round Minimal None   Left 2 1 Round Minimal None         Visual Fields (Counting fingers)       Left Right    Full Full         Extraocular Movement       Right Left    Full Full         Neuro/Psych     Oriented x3: Yes   Mood/Affect: Normal         Dilation     Both eyes: 1.0% Mydriacyl, 2.5% Phenylephrine @ 3:03 PM           Slit Lamp and Fundus Exam     Slit Lamp Exam       Right Left   Lids/Lashes Dermato, mild MGD Dermato, mild MGD   Conjunctiva/Sclera Temporal pinguecula Temporal pinguecula   Cornea EBMD, mild haze, 2+ PEE, mild Debris in tear film EBMD, mild haze, 1+ PEE, well healed temporal cataract wounds, arcus   Anterior Chamber Deep and clear; narrow temporal angle Deep and clear   Iris Round and dilated, mild anterior bowing, No NVI Round and moderately dilated to 5.65m   Lens 2-3+ NS with brunesence, 2-3+ cortical PCIOL, 1+ PCO - non central   Vitreous Synerisis Synerisis         Fundus Exam       Right Left   Disc Pink, sharp, +cupping, central pallor, Thin inferior rim Pink and sharp, +cupping, +PPA   C/D Ratio 0.7 0.6    Macula Flat, blunted foveal reflex, trace central cystic changes, RPE mottling and clumping, no heme Flat, blunted foveal reflex, clusters of fine MA nasal and superior to fovea -- improved, +cystic changes -- slightly improved, focal exudates superior to fovea - improving   Vessels attenuated, Tortuous Attenuated, mild av crossing changes, mild tortuousity   Periphery Attached, rare MA Attached. No heme.           Refraction     Wearing Rx       Sphere Cylinder Axis Add   Right -0.75 +3.50 163 +2.50   Left -1.00 +1.50 015 +2.50            IMAGING AND PROCEDURES  Imaging and Procedures for 05/26/2021  OCT, Retina - OU - Both Eyes       Right Eye Quality was good. Central Foveal Thickness: 304. Progression has been stable. Findings include abnormal foveal contour, no SRF, vitreomacular adhesion , intraretinal fluid (Persistent cystic changes superior to fovea).   Left Eye Quality was good. Central Foveal Thickness: 374. Progression has improved. Findings include abnormal foveal contour, intraretinal fluid, intraretinal hyper-reflective material (Mild interval improvement in IRF/cystic changes; partial PVD).   Notes *Images captured and stored on drive  Diagnosis / Impression:  Mild DME OU (OS > OD) OD: Persistent cystic changes superior to fovea OS: Mild Interval improvement in IRF/cystic changes; partial PVD  Clinical management:  See below  Abbreviations: NFP - Normal foveal profile. CME - cystoid macular edema. PED - pigment epithelial detachment. IRF - intraretinal fluid. SRF - subretinal fluid. EZ - ellipsoid zone. ERM - epiretinal membrane. ORA - outer retinal atrophy. ORT - outer retinal tubulation. SRHM - subretinal hyper-reflective material. IRHM - intraretinal hyper-reflective material     Intravitreal Injection, Pharmacologic Agent - OS - Left Eye       Time Out 05/26/2021. 4:09 PM. Confirmed correct patient, procedure, site, and patient consented.    Anesthesia Topical anesthesia was used. Anesthetic medications included Lidocaine 2%, Proparacaine 0.5%.   Procedure Preparation included 5% betadine to ocular surface, eyelid speculum. A supplied (32g) needle was used.   Injection: 2 mg aflibercept 2 MG/0.05ML   Route: Intravitreal, Site: Left Eye   NDC: [id number], Lot: [id number], Expiration date: 03/13/2022, Waste: 0.05 mL   Post-op Post injection exam found visual acuity of at least counting fingers. The patient tolerated the procedure well. There were no complications. The patient received written and verbal post procedure care education.            ASSESSMENT/PLAN:    ICD-10-CM   1. 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, associated with type 2 diabetes mellitus (HCC)  [id number] Intravitreal Injection, Pharmacologic Agent - OS - Left Eye    aflibercept (EYLEA) SOLN 2 mg    2. Retinal edema  H35.81 OCT, Retina - OU - Both Eyes    3. Essential hypertension  I10     4. Hypertensive retinopathy of both eyes  H35.033     5. Combined forms of age-related cataract of right eye  H25.811     6. 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 History of herpes zoster of eye  Z86.19     9. Primary open angle glaucoma of both eyes, unspecified glaucoma stage  H40.1130      1,2. 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 Single focal MA superior to fovea.                                  OS: Perifoveal Mas w/late leakage - OCT shows diabetic macular edema OU (OS>OD) - s/p IVA OS # 1(07.26.21), #2 (08.24.21), #3 (09.21.21), #4 (10.22.21) -- IVA resistance - s/p IVE OS #1 (11.22.21), #2 (12.21.21), #3 (01.20.22), #4 (02.18.22), #5 (03.18.22), #6 (04.15.22), #7 (05.10.22) -- sample, #8 (06.08.22), #9 (07.12.22) - BCVA OS 20/50  - OCT shows  OD: Persistent cystic changes ; OS: Mild  Interval improvement in IRF/cystic changes at 5 wks - recommend IVE OS #10 today, 08.12.22 - pt wishes to proceed with injection - RBA of procedure discussed, questions answered - IVA informed consent obtained and signed, 07.26.21 (OS) - IVE informed consent obtained and signed, 11.22.21 (OS) - see procedure note - Good Days approved Eylea until 10/14/2021. - BCBS approved Eylea until 09/04/2021 - f/u in 5 wks -- DFE/OCT, possible injection  3,4. Hypertensive retinopathy OU - discussed importance of tight BP control - monitor  5. Mixed Cataract OD - The symptoms of cataract, surgical options, and treatments and risks were discussed with patient. - discussed diagnosis and progression - under the expert management of Dr. Kathlen Mody  6. Pseudophakia OS  - s/p CE/IOL OS (2020, Dr. Kathlen Mody)  -  IOL in good position, doing well  - monitor  7. 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 Roby Lofts at [facility], who recommended SuperK prior to [place]  - saw Dr. Susa Simmonds 6.6.22 -- procedure scheduled for August  8. History of herpes zoster/iritis OS  - on valrex 1g daily---maintenance   9. POAG OU  - under the expert management of Dr. Kathlen Mody  - IOP 18,15 today  - currently on cosopt BID OU   Ophthalmic Meds Ordered this visit:  Meds ordered this encounter  Medications   aflibercept (EYLEA) SOLN 2 mg       Return for Return in 5 weeks DFE, OCT .  There are no Patient Instructions on file for this visit.  This document serves as a record of services personally performed by Gardiner Sleeper, MD, PhD. It was created on their behalf by Leonie Douglas, an ophthalmic technician. The creation of this record is the provider's dictation and/or activities during the visit.    Electronically signed by: Leonie Douglas COA, 05/28/21  12:49 AM  Gardiner Sleeper, M.D., Ph.D. Diseases & Surgery of the Retina and Vitreous [facility] 05/26/2021   I have reviewed the above documentation for accuracy and completeness, and I agree with the above. Gardiner Sleeper, M.D., Ph.D. 05/28/21 12:49 AM   Abbreviations: M myopia (nearsighted); A astigmatism; H hyperopia (farsighted); P presbyopia; Mrx spectacle prescription;  CTL contact lenses; OD right eye; OS left eye; OU both eyes  XT exotropia; ET esotropia; PEK punctate epithelial keratitis; PEE punctate epithelial erosions; DES dry eye syndrome; MGD meibomian gland dysfunction; ATs artificial tears; PFAT's preservative free artificial tears; [place] nuclear sclerotic cataract; PSC posterior subcapsular cataract; ERM epi-retinal membrane; PVD posterior vitreous detachment; RD retinal detachment; DM diabetes mellitus; DR diabetic retinopathy; NPDR non-proliferative diabetic retinopathy; PDR proliferative diabetic retinopathy; CSME clinically significant macular edema; DME diabetic macular edema; dbh dot blot hemorrhages; CWS cotton wool spot; POAG primary open angle glaucoma; C/D cup-to-disc ratio; HVF humphrey visual field; GVF goldmann visual field; OCT optical coherence tomography; IOP intraocular pressure; BRVO Branch retinal vein occlusion; CRVO central retinal vein occlusion; CRAO central retinal artery occlusion; BRAO branch retinal artery occlusion; RT retinal tear; SB scleral buckle; PPV pars plana vitrectomy; VH Vitreous hemorrhage; PRP panretinal laser photocoagulation; IVK intravitreal kenalog; VMT vitreomacular traction; MH Macular hole;  NVD neovascularization of the disc; NVE neovascularization elsewhere; AREDS age related eye disease study; ARMD age related macular degeneration; POAG primary open angle glaucoma; EBMD epithelial/anterior basement membrane dystrophy; ACIOL anterior chamber intraocular lens; IOL intraocular lens; PCIOL posterior chamber intraocular lens; Phaco/IOL phacoemulsification with intraocular lens placement; [place] photorefractive keratectomy; LASIK laser  assisted in situ keratomileusis; HTN hypertension; DM diabetes mellitus; COPD chronic obstructive pulmonary disease

## 2021-05-26 ENCOUNTER — Encounter (INDEPENDENT_AMBULATORY_CARE_PROVIDER_SITE_OTHER): Payer: Self-pay | Admitting: Ophthalmology

## 2021-05-26 ENCOUNTER — Other Ambulatory Visit: Payer: Self-pay

## 2021-05-26 ENCOUNTER — Ambulatory Visit (INDEPENDENT_AMBULATORY_CARE_PROVIDER_SITE_OTHER): Payer: Medicare Other | Admitting: Ophthalmology

## 2021-05-26 DIAGNOSIS — E113213 Type 2 diabetes mellitus with mild nonproliferative diabetic retinopathy with macular edema, bilateral: Secondary | ICD-10-CM

## 2021-05-26 DIAGNOSIS — I10 Essential (primary) hypertension: Secondary | ICD-10-CM | POA: Diagnosis not present

## 2021-05-26 DIAGNOSIS — Z8619 Personal history of other infectious and parasitic diseases: Secondary | ICD-10-CM

## 2021-05-26 DIAGNOSIS — H35033 Hypertensive retinopathy, bilateral: Secondary | ICD-10-CM

## 2021-05-26 DIAGNOSIS — Z961 Presence of intraocular lens: Secondary | ICD-10-CM

## 2021-05-26 DIAGNOSIS — H18523 Epithelial (juvenile) corneal dystrophy, bilateral: Secondary | ICD-10-CM

## 2021-05-26 DIAGNOSIS — H25811 Combined forms of age-related cataract, right eye: Secondary | ICD-10-CM

## 2021-05-26 DIAGNOSIS — H401130 Primary open-angle glaucoma, bilateral, stage unspecified: Secondary | ICD-10-CM

## 2021-05-26 DIAGNOSIS — H3581 Retinal edema: Secondary | ICD-10-CM

## 2021-05-28 ENCOUNTER — Encounter (INDEPENDENT_AMBULATORY_CARE_PROVIDER_SITE_OTHER): Payer: Self-pay | Admitting: Ophthalmology

## 2021-05-28 MED ORDER — AFLIBERCEPT 2MG/0.05ML IZ SOLN FOR KALEIDOSCOPE
2.0000 mg | INTRAVITREAL | Status: AC | PRN
  Administered 2021-05-26: 2 mg via INTRAVITREAL

## 2021-05-30 ENCOUNTER — Other Ambulatory Visit: Payer: Self-pay | Admitting: *Deleted

## 2021-05-30 ENCOUNTER — Telehealth (HOSPITAL_COMMUNITY): Payer: Self-pay | Admitting: *Deleted

## 2021-05-30 MED ORDER — CARVEDILOL 6.25 MG PO TABS: 6.2500 mg | ORAL_TABLET | Freq: Two times a day (BID) | ORAL | 3 refills | Status: DC

## 2021-05-30 NOTE — Telephone Encounter (Signed)
Reaching out to patient to offer assistance regarding upcoming cardiac imaging study; pt verbalizes understanding of appt date/time, parking situation and where to check in, pre-test NPO status  and verified current allergies; name and call back number provided for further questions should they arise ? ?Adarius Tigges RN Navigator Cardiac Imaging ?[facility] ?[phone] office ?[phone] cell ? ?

## 2021-05-31 ENCOUNTER — Other Ambulatory Visit: Payer: Self-pay

## 2021-05-31 ENCOUNTER — Ambulatory Visit (HOSPITAL_COMMUNITY)
Admission: RE | Admit: 2021-05-31 | Discharge: 2021-05-31 | Disposition: A | Discharge status: Home / Self Care | Payer: Medicare Other | Source: Ambulatory Visit | Attending: Internal Medicine | Admitting: Internal Medicine

## 2021-05-31 ENCOUNTER — Encounter (HOSPITAL_COMMUNITY): Payer: Self-pay

## 2021-05-31 DIAGNOSIS — I4891 Unspecified atrial fibrillation: Secondary | ICD-10-CM | POA: Insufficient documentation

## 2021-05-31 MED ORDER — IOHEXOL 350 MG/ML SOLN
100.0000 mL | Freq: Once | INTRAVENOUS | Status: AC | PRN
  Administered 2021-05-31: 100 mL via INTRAVENOUS

## 2021-06-01 ENCOUNTER — Telehealth: Payer: Self-pay | Admitting: *Deleted

## 2021-06-01 NOTE — Telephone Encounter (Signed)
Cardiac ct overread done. Dr. Jobe Igo did overread: asks that IMPRESSION #3 be reviewed primarily.  3. Moderate thoracic adenopathy. Given concurrent trace bilateral pleural fluid, this could be secondary to a component of congestive heart failure or fluid overload. Consider diagnostic contrast enhanced chest CT follow-up at 3-6 months to confirm resolution.   Will route to Dr.Allred to review.

## 2021-06-05 NOTE — Pre-Procedure Instructions (Signed)
Attempted to call patient regarding procedure instructions  Left voice mail on the following items: Arrival time 1130 Nothing to eat or drink after midnight No meds AM of procedure Responsible person to drive you home and stay with you for 24 hrs  Have you missed any doses of anti-coagulant Eliquis- take both doses today, don't take any in the morning

## 2021-06-06 ENCOUNTER — Ambulatory Visit (HOSPITAL_COMMUNITY)
Admission: RE | Admit: 2021-06-06 | Discharge: 2021-06-06 | Disposition: A | Discharge status: Home / Self Care | Payer: Medicare Other | Attending: Internal Medicine | Admitting: Internal Medicine

## 2021-06-06 ENCOUNTER — Ambulatory Visit (HOSPITAL_COMMUNITY): Payer: Medicare Other | Admitting: Certified Registered Nurse Anesthetist

## 2021-06-06 ENCOUNTER — Encounter (HOSPITAL_COMMUNITY): Payer: Self-pay | Admitting: Internal Medicine

## 2021-06-06 ENCOUNTER — Encounter (HOSPITAL_COMMUNITY)
Admission: RE | Disposition: A | Discharge status: Home / Self Care | Payer: Medicare Other | Source: Home / Self Care | Attending: Internal Medicine

## 2021-06-06 ENCOUNTER — Other Ambulatory Visit: Payer: Self-pay

## 2021-06-06 DIAGNOSIS — Z9049 Acquired absence of other specified parts of digestive tract: Secondary | ICD-10-CM | POA: Diagnosis not present

## 2021-06-06 DIAGNOSIS — Z9079 Acquired absence of other genital organ(s): Secondary | ICD-10-CM | POA: Insufficient documentation

## 2021-06-06 DIAGNOSIS — I428 Other cardiomyopathies: Secondary | ICD-10-CM | POA: Diagnosis not present

## 2021-06-06 DIAGNOSIS — G4733 Obstructive sleep apnea (adult) (pediatric): Secondary | ICD-10-CM | POA: Diagnosis not present

## 2021-06-06 DIAGNOSIS — I4819 Other persistent atrial fibrillation: Secondary | ICD-10-CM | POA: Diagnosis not present

## 2021-06-06 DIAGNOSIS — I110 Hypertensive heart disease with heart failure: Secondary | ICD-10-CM | POA: Diagnosis not present

## 2021-06-06 DIAGNOSIS — E1136 Type 2 diabetes mellitus with diabetic cataract: Secondary | ICD-10-CM | POA: Diagnosis not present

## 2021-06-06 DIAGNOSIS — Z7984 Long term (current) use of oral hypoglycemic drugs: Secondary | ICD-10-CM | POA: Diagnosis not present

## 2021-06-06 DIAGNOSIS — I2510 Atherosclerotic heart disease of native coronary artery without angina pectoris: Secondary | ICD-10-CM | POA: Insufficient documentation

## 2021-06-06 DIAGNOSIS — H409 Unspecified glaucoma: Secondary | ICD-10-CM | POA: Insufficient documentation

## 2021-06-06 DIAGNOSIS — Z85820 Personal history of malignant melanoma of skin: Secondary | ICD-10-CM | POA: Diagnosis not present

## 2021-06-06 DIAGNOSIS — E785 Hyperlipidemia, unspecified: Secondary | ICD-10-CM | POA: Insufficient documentation

## 2021-06-06 DIAGNOSIS — I5032 Chronic diastolic (congestive) heart failure: Secondary | ICD-10-CM | POA: Diagnosis not present

## 2021-06-06 DIAGNOSIS — Z8546 Personal history of malignant neoplasm of prostate: Secondary | ICD-10-CM | POA: Diagnosis not present

## 2021-06-06 DIAGNOSIS — I4510 Unspecified right bundle-branch block: Secondary | ICD-10-CM | POA: Diagnosis not present

## 2021-06-06 DIAGNOSIS — Z794 Long term (current) use of insulin: Secondary | ICD-10-CM | POA: Insufficient documentation

## 2021-06-06 DIAGNOSIS — Z87442 Personal history of urinary calculi: Secondary | ICD-10-CM | POA: Diagnosis not present

## 2021-06-06 DIAGNOSIS — E113293 Type 2 diabetes mellitus with mild nonproliferative diabetic retinopathy without macular edema, bilateral: Secondary | ICD-10-CM | POA: Insufficient documentation

## 2021-06-06 DIAGNOSIS — Z7901 Long term (current) use of anticoagulants: Secondary | ICD-10-CM | POA: Insufficient documentation

## 2021-06-06 DIAGNOSIS — Z79899 Other long term (current) drug therapy: Secondary | ICD-10-CM | POA: Insufficient documentation

## 2021-06-06 HISTORY — PX: ATRIAL FIBRILLATION ABLATION: EP1191

## 2021-06-06 LAB — GLUCOSE, CAPILLARY: Glucose-Capillary: 107 mg/dL — ABNORMAL HIGH (ref 70–99)

## 2021-06-06 LAB — POCT ACTIVATED CLOTTING TIME: Activated Clotting Time: 254 seconds

## 2021-06-06 SURGERY — ATRIAL FIBRILLATION ABLATION
Anesthesia: General

## 2021-06-06 MED ORDER — PANTOPRAZOLE SODIUM 40 MG PO TBEC: 40.0000 mg | DELAYED_RELEASE_TABLET | Freq: Every day | ORAL | 0 refills | Status: DC

## 2021-06-06 MED ORDER — HYDROCODONE-ACETAMINOPHEN 5-325 MG PO TABS: 1.0000 | ORAL_TABLET | ORAL | Status: DC | PRN

## 2021-06-06 MED ORDER — APIXABAN 5 MG PO TABS
5.0000 mg | ORAL_TABLET | Freq: Once | ORAL | Status: AC
  Administered 2021-06-06: 5 mg via ORAL
  Filled 2021-06-06: qty 1

## 2021-06-06 MED ORDER — SODIUM CHLORIDE 0.9 % IV SOLN: INTRAVENOUS | Status: DC

## 2021-06-06 MED ORDER — LIDOCAINE 2% (20 MG/ML) 5 ML SYRINGE
INTRAMUSCULAR | Status: DC | PRN
  Administered 2021-06-06: 60 mg via INTRAVENOUS

## 2021-06-06 MED ORDER — PROTAMINE SULFATE 10 MG/ML IV SOLN
INTRAVENOUS | Status: DC | PRN
  Administered 2021-06-06: 40 mg via INTRAVENOUS

## 2021-06-06 MED ORDER — HEPARIN (PORCINE) IN NACL 1000-0.9 UT/500ML-% IV SOLN
INTRAVENOUS | Status: DC | PRN
  Administered 2021-06-06 (×3): 500 mL

## 2021-06-06 MED ORDER — HEPARIN SODIUM (PORCINE) 1000 UNIT/ML IJ SOLN
INTRAMUSCULAR | Status: AC
  Filled 2021-06-06: qty 2

## 2021-06-06 MED ORDER — HEPARIN (PORCINE) IN NACL 2000-0.9 UNIT/L-% IV SOLN
INTRAVENOUS | Status: DC | PRN
  Administered 2021-06-06: 1000 mL

## 2021-06-06 MED ORDER — ACETAMINOPHEN 325 MG PO TABS
650.0000 mg | ORAL_TABLET | ORAL | Status: DC | PRN
  Filled 2021-06-06: qty 2

## 2021-06-06 MED ORDER — HEPARIN (PORCINE) IN NACL 1000-0.9 UT/500ML-% IV SOLN
INTRAVENOUS | Status: AC
  Filled 2021-06-06: qty 500

## 2021-06-06 MED ORDER — ROCURONIUM BROMIDE 10 MG/ML (PF) SYRINGE
PREFILLED_SYRINGE | INTRAVENOUS | Status: DC | PRN
  Administered 2021-06-06: 60 mg via INTRAVENOUS

## 2021-06-06 MED ORDER — HEPARIN SODIUM (PORCINE) 1000 UNIT/ML IJ SOLN
INTRAMUSCULAR | Status: DC | PRN
  Administered 2021-06-06: 15000 [IU] via INTRAVENOUS

## 2021-06-06 MED ORDER — DEXAMETHASONE SODIUM PHOSPHATE 10 MG/ML IJ SOLN
INTRAMUSCULAR | Status: DC | PRN
Start: 2021-06-06 — End: 2021-06-06
  Administered 2021-06-06: 5 mg via INTRAVENOUS

## 2021-06-06 MED ORDER — ONDANSETRON HCL 4 MG/2ML IJ SOLN: 4.0000 mg | Freq: Four times a day (QID) | INTRAMUSCULAR | Status: DC | PRN

## 2021-06-06 MED ORDER — EPHEDRINE SULFATE-NACL 50-0.9 MG/10ML-% IV SOSY
PREFILLED_SYRINGE | INTRAVENOUS | Status: DC | PRN
  Administered 2021-06-06: 5 mg via INTRAVENOUS

## 2021-06-06 MED ORDER — PROPOFOL 10 MG/ML IV BOLUS
INTRAVENOUS | Status: DC | PRN
  Administered 2021-06-06: 100 mg via INTRAVENOUS

## 2021-06-06 MED ORDER — PHENYLEPHRINE 40 MCG/ML (10ML) SYRINGE FOR IV PUSH (FOR BLOOD PRESSURE SUPPORT)
PREFILLED_SYRINGE | INTRAVENOUS | Status: DC | PRN
  Administered 2021-06-06 (×2): 80 ug via INTRAVENOUS

## 2021-06-06 MED ORDER — SODIUM CHLORIDE 0.9 % IV SOLN: 250.0000 mL | INTRAVENOUS | Status: DC | PRN

## 2021-06-06 MED ORDER — SUGAMMADEX SODIUM 200 MG/2ML IV SOLN
INTRAVENOUS | Status: DC | PRN
Start: 2021-06-06 — End: 2021-06-06
  Administered 2021-06-06: 300 mg via INTRAVENOUS

## 2021-06-06 MED ORDER — SODIUM CHLORIDE 0.9% FLUSH: 3.0000 mL | Freq: Two times a day (BID) | INTRAVENOUS | Status: DC

## 2021-06-06 MED ORDER — PHENYLEPHRINE HCL-NACL 20-0.9 MG/250ML-% IV SOLN
INTRAVENOUS | Status: DC | PRN
  Administered 2021-06-06: 30 ug/min via INTRAVENOUS

## 2021-06-06 MED ORDER — FENTANYL CITRATE (PF) 100 MCG/2ML IJ SOLN
INTRAMUSCULAR | Status: DC | PRN
  Administered 2021-06-06: 100 ug via INTRAVENOUS

## 2021-06-06 MED ORDER — ACETAMINOPHEN 500 MG PO TABS
1000.0000 mg | ORAL_TABLET | Freq: Once | ORAL | Status: AC
  Administered 2021-06-06: 1000 mg via ORAL
  Filled 2021-06-06: qty 2

## 2021-06-06 MED ORDER — HEPARIN SODIUM (PORCINE) 1000 UNIT/ML IJ SOLN
INTRAMUSCULAR | Status: DC | PRN
  Administered 2021-06-06: 3000 [IU] via INTRAVENOUS

## 2021-06-06 MED ORDER — ONDANSETRON HCL 4 MG/2ML IJ SOLN
INTRAMUSCULAR | Status: DC | PRN
  Administered 2021-06-06: 4 mg via INTRAVENOUS

## 2021-06-06 MED ORDER — SODIUM CHLORIDE 0.9% FLUSH: 3.0000 mL | INTRAVENOUS | Status: DC | PRN

## 2021-06-06 MED ORDER — FENTANYL CITRATE (PF) 250 MCG/5ML IJ SOLN: INTRAMUSCULAR | Status: DC | PRN

## 2021-06-06 SURGICAL SUPPLY — 18 items
BLANKET WARM UNDERBOD FULL ACC (MISCELLANEOUS) ×2 IMPLANT
CATH 8FR REPROCESSED SOUNDSTAR (CATHETERS) ×2 IMPLANT
CATH OCTARAY 2.0 F 3-3-3-3-3 (CATHETERS) ×2 IMPLANT
CATH SMTCH THERMOCOOL SF DF (CATHETERS) ×2 IMPLANT
CATH WEBSTER BI DIR CS D-F CRV (CATHETERS) ×2 IMPLANT
CLOSURE PERCLOSE PROSTYLE (VASCULAR PRODUCTS) ×6 IMPLANT
COVER SWIFTLINK CONNECTOR (BAG) ×2 IMPLANT
MAT PREVALON FULL STRYKER (MISCELLANEOUS) ×2 IMPLANT
NEEDLE BAYLIS TRANSSEPTAL 71CM (NEEDLE) ×2 IMPLANT
PACK EP LATEX FREE (CUSTOM PROCEDURE TRAY) ×2
PACK EP LF (CUSTOM PROCEDURE TRAY) ×1 IMPLANT
PAD PRO RADIOLUCENT 2001M-C (PAD) ×2 IMPLANT
PATCH CARTO3 (PAD) ×2 IMPLANT
SHEATH PINNACLE 7F 10CM (SHEATH) ×4 IMPLANT
SHEATH PINNACLE 9F 10CM (SHEATH) ×2 IMPLANT
SHEATH PROBE COVER 6X72 (BAG) ×2 IMPLANT
SHEATH SWARTZ TS SL2 63CM 8.5F (SHEATH) ×2 IMPLANT
TUBING SMART ABLATE COOLFLOW (TUBING) ×2 IMPLANT

## 2021-06-06 NOTE — Anesthesia Procedure Notes (Signed)
Procedure Name: Intubation Date/Time: 06/06/2021 1:35 PM Performed by: Dorthea Cove, CRNA Pre-anesthesia Checklist: Patient identified, Emergency Drugs available, Suction available and Patient being monitored Patient Re-evaluated:Patient Re-evaluated prior to induction Oxygen Delivery Method: Circle system utilized Preoxygenation: Pre-oxygenation with 100% oxygen Induction Type: IV induction Ventilation: Mask ventilation without difficulty and Oral airway inserted - appropriate to patient size Laryngoscope Size: Mac and 4 Grade View: Grade I Tube type: Oral Tube size: 7.5 mm Number of attempts: 1 Airway Equipment and Method: Stylet and Oral airway Placement Confirmation: ETT inserted through vocal cords under direct vision, positive ETCO2 and breath sounds checked- equal and bilateral Secured at: 22 cm Tube secured with: Tape Dental Injury: Teeth and Oropharynx as per pre-operative assessment

## 2021-06-06 NOTE — Discharge Instructions (Signed)
Post procedure care instructions No driving for 4 days. No lifting over 5 lbs for 1 week. No vigorous or sexual activity for 1 week. You may return to work/your usual activities on 06/14/21. Keep procedure site clean & dry. If you notice increased pain, swelling, bleeding or pus, call/return!  You may shower after 24 hours, but no soaking in baths/hot tubs/pools for 1 week.    You have an appointment set up with the Atrial Fibrillation Clinic.  Multiple studies have shown that being followed by a dedicated atrial fibrillation clinic in addition to the standard care you receive from your other physicians improves health. We believe that enrollment in the atrial fibrillation clinic will allow us to better care for you.   The phone number to the Atrial Fibrillation Clinic is [phone]. The clinic is staffed Monday through Friday from 8:30am to 5pm.  Parking Directions: The clinic is located in the Heart and Vascular Building connected to [facility]. 1)From [address] turn on to [address] and go to the 3rd entrance  (Heart and Vascular entrance) on the right. 2)Look to the right for Heart &Vascular Parking Garage. 3)A code for the entrance is required, for Sept is 4455.   4)Take the elevators to the 1st floor. Registration is in the room with the glass walls at the end of the hallway.  If you have any trouble parking or locating the clinic, please don't hesitate to call [phone].  

## 2021-06-06 NOTE — H&P (Signed)
PCP: [name], MD Primary Cardiologist: Dr [name] Primary EP: Dr Genevie Ann is a 80 y.o. male who presents today for electrophysiology study and ablation for afib.He has returned to persistent afib.  Cardioversion has not been pursued.  He is rate controlled but has developed worsening CHF. + SOB with minimal activity and worsening of edema.  He has increased his lasix to '60mg'$  without much benefit.  + fatigue.  Today, he denies symptoms of palpitations, chest pain,  dizziness, presyncope, or syncope.  The patient is otherwise without complaint today.        Past Medical History:  Diagnosis Date   Arthritis     Atrial fibrillation (HCC)     CAD (coronary artery disease)      a. Cath 03/17/15 showing 100% ostial D1, 50% prox LAD to mid LAD, 40% RPDA stenosis. Med rx. // Myoview 01/2020: EF 31 diffuse perfusion defect without reversibility (suspect artifact); reviewed with Dr. Aggie Cosier study felt to be low risk   Cataract      Mixed form OD   Chronic diastolic CHF (congestive heart failure) (HCC)     Diabetic retinopathy ([place])      NPDR OU   Essential hypertension     Glaucoma      POAG OU   History of gout     Hyperlipidemia     Hypertensive retinopathy      OU   Kidney stones     Melanoma of neck (HCC)     NICM (nonischemic cardiomyopathy) ([place])     OSA on CPAP 2012   Prostate cancer ([place])     Type II diabetes mellitus ([place])           Past Surgical History:  Procedure Laterality Date   ABDOMINAL HERNIA REPAIR        w/mesh   CARDIAC CATHETERIZATION N/A 03/17/2015    Procedure: Left Heart Cath and Coronary Angiography;  Surgeon: Lorretta Harp, MD;  Location: [facility];  Service: Cardiovascular;  Laterality: N/A;   carotid doppler   09/17/2008    rigt and left ICAs 0-49%;mildly  abnormal   CATARACT EXTRACTION Left 2020    Dr. Kathlen Mody   COLONOSCOPY N/A 05/16/2017    Procedure: COLONOSCOPY;  Surgeon: Rogene Houston, MD;  [facility];  Service: Endoscopy;  Laterality: N/A;  [place]   05/25/2009    EF 50-55%,LA mildly dilated, LV function normal   ELECTROPHYSIOLOGIC STUDY N/A 04/05/2015    Procedure: Cardioversion;  Surgeon: Sanda Klein, MD;  Location: [facility];  Service: Cardiovascular;  Laterality: N/A;   ELECTROPHYSIOLOGIC STUDY N/A 09/06/2015    Procedure: Atrial Fibrillation Ablation;  Surgeon: Thompson Grayer, MD;  Location: [facility];  Service: Cardiovascular;  Laterality: N/A;   ELECTROPHYSIOLOGIC STUDY N/A 07/12/2016    [facility] afib ablation by Dr Rayann Heman   EXCISIONAL HEMORRHOIDECTOMY        "inside and out"   EYE SURGERY Left 2020    Cat Sx - Dr. Kathlen Mody   FINE NEEDLE ASPIRATION Right      knee; "drew ~ 1 quart off"   Brady Right      "neck"   NM MYOCAR PERF WALL MOTION   02/21/2012    EF 61% ,EXERCISE 7 METS. exercise stopped due to wheezing and shortness of breathe  POLYPECTOMY   05/16/2017    Procedure: POLYPECTOMY;  Surgeon: Rogene Houston, MD;  [facility];  Service: Endoscopy;;  colon   PROSTATECTOMY       SHOULDER OPEN ROTATOR CUFF REPAIR Right X 2   TEE WITHOUT CARDIOVERSION N/A 09/05/2015    Procedure: TRANSESOPHAGEAL ECHOCARDIOGRAM (TEE);  Surgeon: Sueanne Margarita, MD;  Location: [facility];  Service: Cardiovascular;  Laterality: N/A;      ROS- all systems are reviewed and negatives except as per HPI above         Current Outpatient Medications  Medication Sig Dispense Refill   amLODipine (NORVASC) 10 MG tablet Take 10 mg by mouth daily.       Carboxymethylcellul-Glycerin (LUBRICATING EYE DROPS OP) Apply 1 drop to eye daily as needed (itching eyes).       carvedilol (COREG) 6.25 MG tablet Takes 1 tablet by mouth in the AM and 1 tablet by mouth in the PM       dorzolamide-timolol (COSOPT) 22.3-6.8 MG/ML ophthalmic solution Place 1 drop into both eyes 2 (two) times daily.  10 mL 6   ELIQUIS 5 MG TABS tablet TAKE 1 TABLET TWICE A DAY 180 tablet 1   furosemide (LASIX) 40 MG tablet TAKE 1 TABLET BY MOUTH EVERY DAY 90 tablet 3   gemfibrozil (LOPID) 600 MG tablet Take 600 mg by mouth at bedtime.        glimepiride (AMARYL) 2 MG tablet Take 2 mg by mouth 2 (two) times daily.        Ibuprofen-diphenhydrAMINE Cit (ADVIL PM PO) Take by mouth at bedtime.       Insulin Glargine (BASAGLAR KWIKPEN) 100 UNIT/ML SOPN Inject 40 Units into the skin at bedtime.        lisinopril (ZESTRIL) 40 MG tablet TAKE 1 TABLET BY MOUTH EVERY DAY 90 tablet 3   Melatonin 10 MG TABS Take 1 tablet by mouth at bedtime.       metFORMIN (GLUCOPHAGE) 500 MG tablet Take by mouth.       metFORMIN (GLUCOPHAGE-XR) 500 MG 24 hr tablet Take 1,000 mg by mouth 2 (two) times daily.       pioglitazone (ACTOS) 15 MG tablet Take 15 mg by mouth every morning.       valACYclovir (VALTREX) 1000 MG tablet Take 1,000 mg by mouth daily.        lisinopril (ZESTRIL) 40 MG tablet Take 1 tablet by mouth daily.        No current facility-administered medications for this visit.      Physical Exam: Vitals:   06/06/21 1157  BP: (!) 162/96  Pulse: 91  Resp: 18  Temp: 98.4 F (36.9 C)  SpO2: 98%    GEN- The patient is overweight appearing, alert and oriented x 3 today.   Head- normocephalic, atraumatic Eyes-  Sclera clear, conjunctiva pink Ears- hearing intact Oropharynx- clear Lungs- + bibasilar rales, normal work of breathing Heart- irregular rate and rhythm, no murmurs, rubs or gallops, PMI not laterally displaced GI- soft, NT, ND, + BS Extremities- no clubbing, cyanosis, +2 edema      Wt Readings from Last 3 Encounters:  04/14/21 257 lb 6.4 oz (116.8 kg)  03/03/21 247 lb (112 kg)  02/02/21 242 lb (109.8 kg)    Echo 02/02/20- EF 55%, moderate LVH, LA 4.7cm     EKG tracing ordered today is personally reviewed and shows rate controlled afib (V rates 60s)   AF clinic notes, Dr Thera Flake  notes, my prior  notes, and Dr Chuck Hint notes are reviewed   Assessment and Plan:   Persistent afib He did very well post ablation after failing tikosyn.  Now has recurrence of his afib. Chads2vasc score is 5.  He is on eliquis   Risk, benefits, and alternatives to EP study and radiofrequency ablation for afib were again discussed in detail today. These risks include but are not limited to stroke, bleeding, vascular damage, tamponade, perforation, damage to the esophagus, lungs, and other structures, pulmonary vein stenosis, worsening renal function, and death. The patient understands these risk and wishes to proceed.    Cardiac CT reviewed at length with the patient today.  he reports compliance with [place] without interruption.  Thompson Grayer MD, [place] 06/06/2021 12:40 PM

## 2021-06-06 NOTE — Anesthesia Preprocedure Evaluation (Addendum)
Anesthesia Evaluation  Patient identified by MRN, date of birth, ID band Patient awake    Reviewed: Allergy & Precautions, NPO status , Patient's Chart, lab work & pertinent test results, reviewed documented beta blocker date and time   History of Anesthesia Complications Negative for: history of anesthetic complications  Airway Mallampati: II  TM Distance: >3 FB Neck ROM: Full    Dental  (+) Dental Advisory Given, Missing   Pulmonary shortness of breath, sleep apnea and Continuous Positive Airway Pressure Ventilation , former smoker,    breath sounds clear to auscultation       Cardiovascular hypertension, Pt. on medications and Pt. on home beta blockers (-) angina+ CAD and + Past MI   Rhythm:Irregular Rate:Normal  Myoview 01/2020: EF 31 diffuse perfusion defect without reversibility  04/2021 ECHO: EF 60-65%, normal LVF, mod LVH, no significant valvular abnormalities   Neuro/Psych glaucoma    GI/Hepatic negative GI ROS, Neg liver ROS,   Endo/Other  diabetes, Oral Hypoglycemic Agents, Insulin DependentMorbid obesity  Renal/GU stones     Musculoskeletal   Abdominal (+) + obese,   Peds  Hematology eliquis   Anesthesia Other Findings   Reproductive/Obstetrics                            Anesthesia Physical Anesthesia Plan  ASA: 3  Anesthesia Plan: General   Post-op Pain Management:    Induction: Intravenous  PONV Risk Score and Plan: 2 and Ondansetron and Dexamethasone  Airway Management Planned: Oral ETT  Additional Equipment: None  Intra-op Plan:   Post-operative Plan: Extubation in OR  Informed Consent: I have reviewed the patients History and Physical, chart, labs and discussed the procedure including the risks, benefits and alternatives for the proposed anesthesia with the patient or authorized representative who has indicated his/her understanding and acceptance.     Dental  advisory given  Plan Discussed with: CRNA and Surgeon  Anesthesia Plan Comments:        Anesthesia Quick Evaluation

## 2021-06-06 NOTE — Anesthesia Postprocedure Evaluation (Signed)
Anesthesia Post Note  Patient: Lucas Dudley  Procedure(s) Performed: ATRIAL FIBRILLATION ABLATION     Patient location during evaluation: PACU Anesthesia Type: General Level of consciousness: awake and alert, patient cooperative and oriented Pain management: pain level controlled Vital Signs Assessment: post-procedure vital signs reviewed and stable Respiratory status: spontaneous breathing, nonlabored ventilation and respiratory function stable Cardiovascular status: blood pressure returned to baseline and stable Postop Assessment: no apparent nausea or vomiting and adequate PO intake Anesthetic complications: no   No notable events documented.  Last Vitals:  Vitals:   06/06/21 1627 06/06/21 1630  BP: (!) 151/70 (!) 149/67  Pulse: 67 70  Resp: 13 18  Temp:    SpO2: 96% 94%    Last Pain:  Vitals:   06/06/21 1536  TempSrc:   PainSc: 0-No pain                 Rifka Ramey,E. Dera Vanaken

## 2021-06-06 NOTE — Transfer of Care (Signed)
Immediate Anesthesia Transfer of Care Note  Patient: REEDER PROA  Procedure(s) Performed: ATRIAL FIBRILLATION ABLATION  Patient Location: Cath Lab  Anesthesia Type:General  Level of Consciousness: awake, alert  and oriented  Airway & Oxygen Therapy: Patient Spontanous Breathing and Patient connected to nasal cannula oxygen  Post-op Assessment: Report given to RN and Post -op Vital signs reviewed and stable  Post vital signs: Reviewed and stable  Last Vitals:  Vitals Value Taken Time  BP 143/74 06/06/21 1543  Temp    Pulse 66 06/06/21 1544  Resp 19 06/06/21 1544  SpO2 92 % 06/06/21 1544  Vitals shown include unvalidated device data.  Last Pain:  Vitals:   06/06/21 1258  TempSrc:   PainSc: 0-No pain         Complications: No notable events documented.

## 2021-06-07 ENCOUNTER — Encounter (HOSPITAL_COMMUNITY): Payer: Self-pay | Admitting: Internal Medicine

## 2021-06-22 ENCOUNTER — Telehealth: Payer: Self-pay | Admitting: Cardiovascular Disease

## 2021-06-22 NOTE — Telephone Encounter (Signed)
What problem are you experiencing? CPAP is leaking water   Who is your medical equipment company? Doesn't know will call him to figure it out    Please route to the sleep study assistant.

## 2021-06-26 NOTE — Telephone Encounter (Signed)
Patient's daughter calling back to follow up. She says to call her cell phone: [phone].

## 2021-06-27 NOTE — Telephone Encounter (Signed)
Returned a call to patient's daughter. She states that the CPAP machine is "leaking." After reviewing his chart I see where he was ordered a new machine in February 2021. I told her to let me speak with the DME company to see what happened with that order. And call her back. After speaking with Ivin Booty at [employer] I was informed that he did not qualify for a new machine until November 2021. Ivin Booty asked for me to resend another order as well as a recent office visit and she can get him a new machine. Another call was made to his daughter. I had to leave her a message. Message left informing her that [employer] will be working on getting him a new machine. If she has not heard from them after a couple of weeks she was instructed to give either them or myself a call. They will have to send to his insurance for benefits. I recommended in the meantime while he waits on another machine he contacts [employer] to maybe see if they can tell him what to do about his current leak.

## 2021-06-27 NOTE — Telephone Encounter (Signed)
-----   Message from [place] sent at 06/26/2021  2:45 PM EDT ----- Regarding: CPAP leaking Patient's daughter calling back to follow up on call last Thursday about the patient's CPAP leaking. She says to call her cell phone: [phone].

## 2021-06-28 NOTE — Telephone Encounter (Signed)
Spoke with Lucas Dudley at [facility] today she advises me that she has spoken with the patient and they are being set up with a new CPAP machine.

## 2021-06-30 NOTE — Progress Notes (Signed)
[facility] Note  07/03/2021     CHIEF COMPLAINT Patient presents for Retina Follow Up   HISTORY OF PRESENT ILLNESS: Lucas Dudley is a 80 y.o. male who presents to the clinic today for:  HPI     Retina Follow Up   Patient presents with  Diabetic Retinopathy.  In both eyes.  Duration of 5 weeks.  Since onset it is stable.  I, the attending physician,  performed the HPI with the patient and updated documentation appropriately.        Comments   5 week follow up NPDR OU-  Patient had an ablation since he was here last.  Vision appears the same OU.  BS 138 this am A1C 7.8  He has been having low sugars in the am that is waking him up.  Last night he didn't take his medication correctly which is the reason why he it was high this morning.  Only using Dorz/Timolol qhs OU. Patient is having cataract sx OD with Dr. Kathlen Mody 07/05/2021.  Dr. Kathlen Mody started him on Omnipred BID OU.      Last edited by Bernarda Caffey, MD on 07/03/2021  9:02 PM.    Pt has cataract surgery OD scheduled with Dr. Kathlen Mody on Wednesday, pt had Super K OD with Dr. Edilia Bo on 08.15.22, pt states vision OD has improved since then   Referring physician: Hortencia Pilar, MD [place],  [place] [id number]  HISTORICAL INFORMATION:   Selected notes from the MEDICAL RECORD NUMBER Referred by Dr. Kathlen Mody for DEE   CURRENT MEDICATIONS: Current Outpatient Medications (Ophthalmic Drugs)  Medication Sig   Carboxymethylcellul-Glycerin (LUBRICATING EYE DROPS OP) Apply 1 drop to eye at bedtime.   dorzolamide-timolol (COSOPT) 22.3-6.8 MG/ML ophthalmic solution Place 1 drop into both eyes 2 (two) times daily.   prednisoLONE acetate (PRED FORTE) 1 % ophthalmic suspension Place 1 drop into both eyes in the morning and at bedtime.   moxifloxacin (VIGAMOX) 0.5 % ophthalmic solution Place 1 drop into the right eye in the morning, at noon, in the evening, and at bedtime. (Patient  not taking: Reported on 07/03/2021)   prednisoLONE acetate (PRED FORTE) 1 % ophthalmic suspension Place 1 drop into the right eye in the morning, at noon, in the evening, and at bedtime. (Patient not taking: Reported on 07/03/2021)   No current facility-administered medications for this visit. (Ophthalmic Drugs)   Current Outpatient Medications (Other)  Medication Sig   amLODipine (NORVASC) 2.5 MG tablet Take 1 tablet (2.5 mg total) by mouth daily. (Patient taking differently: Take 2.5 mg by mouth at bedtime.)   apixaban (ELIQUIS) 5 MG TABS tablet Take 1 tablet (5 mg total) by mouth 2 (two) times daily.   carvedilol (COREG) 6.25 MG tablet Take 1 tablet (6.25 mg total) by mouth 2 (two) times daily with a meal. Takes 1 tablet by mouth in the AM and 1 tablet by mouth in the PM (Patient taking differently: Take 12.5 mg by mouth 2 (two) times daily with a meal.)   diphenhydramine-acetaminophen (TYLENOL PM) 25-500 MG TABS tablet Take 2 tablets by mouth at bedtime.   Docusate Calcium (STOOL SOFTENER PO) Take 600 mg by mouth daily.   furosemide (LASIX) 40 MG tablet Take 1.5 tablets (60 mg total) by mouth daily.   gemfibrozil (LOPID) 600 MG tablet Take 600 mg by mouth at bedtime.    glimepiride (AMARYL) 2 MG tablet Take 2 mg by mouth 2 (two)  times daily.    Insulin Glargine (BASAGLAR KWIKPEN) 100 UNIT/ML SOPN Inject 38 Units into the skin at bedtime.   lisinopril (ZESTRIL) 40 MG tablet Take 40 mg by mouth daily.   Melatonin 10 MG TABS Take 10 tablets by mouth at bedtime.   metFORMIN (GLUCOPHAGE-XR) 500 MG 24 hr tablet Take 1,000 mg by mouth 2 (two) times daily.   mineral oil liquid Take 15 mLs by mouth daily as needed for moderate constipation.   Multiple Vitamins-Minerals (MULTIVITAMIN WITH MINERALS) tablet Take 1 tablet by mouth daily. Centrum men 50+   Omega-3 1000 MG CAPS Take 1,000 mg by mouth daily.   pantoprazole (PROTONIX) 40 MG tablet Take 1 tablet (40 mg total) by mouth daily.   pioglitazone  (ACTOS) 15 MG tablet Take 15 mg by mouth every morning.   valACYclovir (VALTREX) 1000 MG tablet Take 1,000 mg by mouth at bedtime.   No current facility-administered medications for this visit. (Other)   REVIEW OF SYSTEMS: ROS   Positive for: Gastrointestinal, Endocrine, Cardiovascular, Eyes, Respiratory Negative for: Constitutional, Neurological, Skin, Genitourinary, Musculoskeletal, HENT, Psychiatric, Allergic/Imm, Heme/Lymph Last edited by Leonie Douglas, COA on 07/03/2021  2:30 PM.      ALLERGIES Allergies  Allergen Reactions   Tape Rash and Other (See Comments)    Causes skin redness, Use paper tape only.   PAST MEDICAL HISTORY Past Medical History:  Diagnosis Date   Arthritis    Atrial fibrillation (HCC)    CAD (coronary artery disease)    a. Cath 03/17/15 showing 100% ostial D1, 50% prox LAD to mid LAD, 40% RPDA stenosis. Med rx. // Myoview 01/2020: EF 31 diffuse perfusion defect without reversibility (suspect artifact); reviewed with Dr. Aggie Cosier study felt to be low risk   Cataract    Mixed form OD   Chronic diastolic CHF (congestive heart failure) (HCC)    Diabetic retinopathy ([place])    NPDR OU   Essential hypertension    Glaucoma    POAG OU   History of gout    Hyperlipidemia    Hypertensive retinopathy    OU   Kidney stones    Melanoma of neck (HCC)    NICM (nonischemic cardiomyopathy) ([place])    OSA on CPAP 2012   Prostate cancer ([place])    Type II diabetes mellitus ([place])    Past Surgical History:  Procedure Laterality Date   ABDOMINAL HERNIA REPAIR     w/mesh   ATRIAL FIBRILLATION ABLATION N/A 06/06/2021   Procedure: ATRIAL FIBRILLATION ABLATION;  Surgeon: Thompson Grayer, MD;  Location: [facility];  Service: Cardiovascular;  Laterality: N/A;   CARDIAC CATHETERIZATION N/A 03/17/2015   Procedure: Left Heart Cath and Coronary Angiography;  Surgeon: Lorretta Harp, MD;  Location: [facility];  Service: Cardiovascular;  Laterality: N/A;   carotid  doppler  09/17/2008   rigt and left ICAs 0-49%;mildly  abnormal   CATARACT EXTRACTION Left 2020   Dr. Kathlen Mody   COLONOSCOPY N/A 05/16/2017   Procedure: COLONOSCOPY;  Surgeon: Rogene Houston, MD;  [facility];  Service: Endoscopy;  Laterality: N/A;  [place]  05/25/2009   EF 50-55%,LA mildly dilated, LV function normal   ELECTROPHYSIOLOGIC STUDY N/A 04/05/2015   Procedure: Cardioversion;  Surgeon: Sanda Klein, MD;  Location: [facility];  Service: Cardiovascular;  Laterality: N/A;   ELECTROPHYSIOLOGIC STUDY N/A 09/06/2015   Procedure: Atrial Fibrillation Ablation;  Surgeon: Thompson Grayer, MD;  Location: [facility];  Service: Cardiovascular;  Laterality: N/A;   ELECTROPHYSIOLOGIC STUDY N/A 07/12/2016   red[place] afib ablation by Dr Rayann Heman   EXCISIONAL HEMORRHOIDECTOMY     "inside and out"   EYE SURGERY Left 2020   Cat Sx - Dr. Kathlen Mody   FINE NEEDLE ASPIRATION Right    knee; "drew ~ 1 quart off"   [place] Right    "neck"   NM MYOCAR PERF WALL MOTION  02/21/2012   EF 61% ,EXERCISE 7 METS. exercise stopped due to wheezing and shortness of breathe   POLYPECTOMY  05/16/2017   Procedure: POLYPECTOMY;  Surgeon: Rogene Houston, MD;  [facility];  Service: Endoscopy;;  colon   PROSTATECTOMY     SHOULDER OPEN ROTATOR CUFF REPAIR Right X 2   TEE WITHOUT CARDIOVERSION N/A 09/05/2015   Procedure: TRANSESOPHAGEAL ECHOCARDIOGRAM (TEE);  Surgeon: Sueanne Margarita, MD;  Location: [facility];  Service: Cardiovascular;  Laterality: N/A;   FAMILY HISTORY Family History  Problem Relation Age of Onset   Heart disease Mother    Lung cancer Mother    Heart attack Mother 46   Stroke Brother    Healthy Daughter    Diabetes Father    Heart disease Father    SOCIAL HISTORY Social History   Tobacco Use   Smoking status: Former    Packs/day: 2.00    Years: 27.00    Pack years: 54.00     Types: Cigarettes    Quit date: 10/31/1983    Years since quitting: 37.6   Smokeless tobacco: Never  Vaping Use   Vaping Use: Never used  Substance Use Topics   Alcohol use: No    Alcohol/week: 0.0 standard drinks    Comment: "used to drink; stopped ~ 2008"   Drug use: No         OPHTHALMIC EXAM:  Base Eye Exam     Visual Acuity (Snellen - Linear)       Right Left   Dist cc 20/80 +2 20/100 -2   Dist ph cc 20/60 20/80         Tonometry (Tonopen, 2:48 PM)       Right Left   Pressure 22 21         Pupils       Dark Light Shape React APD   Right 2 1 Round Minimal None   Left 2 1 Round Minimal None         Visual Fields (Counting fingers)       Left Right    Full Full         Extraocular Movement       Right Left    Full Full         Neuro/Psych     Oriented x3: Yes   Mood/Affect: Normal         Dilation     Both eyes: 1.0% Mydriacyl, 2.5% Phenylephrine @ 2:48 PM           Slit Lamp and Fundus Exam     Slit Lamp Exam       Right Left   Lids/Lashes Dermato, mild MGD Dermato, mild MGD   Conjunctiva/Sclera Temporal pinguecula Temporal pinguecula   Cornea EBMD, mild haze, 2+ PEE, mild Debris in tear film EBMD, mild haze, 1+ PEE, well healed temporal cataract wounds, arcus   Anterior Chamber Deep and clear; narrow temporal angle Deep and clear  Iris Round and dilated, mild anterior bowing, No NVI Round and moderately dilated to 5.63m   Lens 2-3+ NS with brunesence, 2-3+ cortical PCIOL, 1+ PCO - non central   Vitreous Synerisis Synerisis         Fundus Exam       Right Left   Disc Pink, sharp, +cupping, central pallor, Thin inferior rim Pink and sharp, +cupping, +PPA   C/D Ratio 0.7 0.6   Macula Flat, blunted foveal reflex, trace central cystic changes, RPE mottling and clumping, no heme Flat, blunted foveal reflex, clusters of fine MA nasal and superior to fovea, +cystic changes -- slightly increased, focal exudates  superior to fovea - improving   Vessels attenuated, Tortuous Attenuated, mild av crossing changes, mild tortuousity   Periphery Attached, rare MA Attached. No heme.           Refraction     Wearing Rx       Sphere Cylinder Axis Add   Right -0.75 +3.25 165 +2.50   Left -1.25 +1.25 015 +2.50         Manifest Refraction       Sphere Cylinder Axis Dist VA   Right [place] +2.25 025 20/50-2   Left -1.25 +1.00 015 20/60-2            IMAGING AND PROCEDURES  Imaging and Procedures for 07/03/2021  OCT, Retina - OU - Both Eyes       Right Eye Quality was good. Central Foveal Thickness: 304. Progression has been stable. Findings include abnormal foveal contour, no SRF, vitreomacular adhesion , intraretinal fluid (Persistent cystic changes superior to fovea).   Left Eye Quality was good. Central Foveal Thickness: 405. Progression has worsened. Findings include abnormal foveal contour, intraretinal fluid, intraretinal hyper-reflective material (Mild interval increase in IRF/cystic changes; partial PVD).   Notes *Images captured and stored on drive  Diagnosis / Impression:  Mild DME OU (OS > OD) OD: Persistent cystic changes superior to fovea OS: Mild interval increase in IRF/cystic changes; partial PVD  Clinical management:  See below  Abbreviations: NFP - Normal foveal profile. CME - cystoid macular edema. PED - pigment epithelial detachment. IRF - intraretinal fluid. SRF - subretinal fluid. EZ - ellipsoid zone. ERM - epiretinal membrane. ORA - outer retinal atrophy. ORT - outer retinal tubulation. SRHM - subretinal hyper-reflective material. IRHM - intraretinal hyper-reflective material     Intravitreal Injection, Pharmacologic Agent - OS - Left Eye       Time Out 07/03/2021. 4:08 PM. Confirmed correct patient, procedure, site, and patient consented.   Anesthesia Topical anesthesia was used. Anesthetic medications included Lidocaine 2%, Proparacaine 0.5%.    Procedure Preparation included 5% betadine to ocular surface, eyelid speculum. A (32g) needle was used.   Injection: 2 mg aflibercept 2 MG/0.05ML   Route: Intravitreal, Site: Left Eye   NDC: [id number] Lot: [id number] Expiration date: 04/13/2022, Waste: 0.05 mL   Post-op Post injection exam found visual acuity of at least counting fingers. The patient tolerated the procedure well. There were no complications. The patient received written and verbal post procedure care education. Post injection medications were not given.            ASSESSMENT/PLAN:    ICD-10-CM   1. 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, associated with type 2 diabetes mellitus (HCC)  [id number] Injection, Pharmacologic Agent - OS - Left Eye    aflibercept (EYLEA) SOLN 2 mg    2. Retinal edema  H35.81 OCT, Retina - OU - Both Eyes    3. Essential hypertension  I10     4. Hypertensive retinopathy of both eyes  H35.033     5. Combined forms of age-related cataract of right eye  H25.811     6. 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 History of herpes zoster of eye  Z86.19     9. Primary open angle glaucoma of both eyes, unspecified glaucoma stage  H40.1130       1,2. 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 Single focal MA superior to fovea.                                  OS: Perifoveal Mas w/late leakage - OCT shows diabetic macular edema OU (OS>OD) - s/p IVA OS # 1(07.26.21), #2 (08.24.21), #3 (09.21.21), #4 (10.22.21) -- IVA resistance - s/p IVE OS #1 (11.22.21), #2 (12.21.21), #3 (01.20.22), #4 (02.18.22), #5 (03.18.22), #6 (04.15.22), #7 (05.10.22) -- sample, #8 (06.08.22), #9 (07.12.22), #10 (08.12.22) - BCVA OS 20/80 -- down from 20/50  - OCT shows  OD: Persistent cystic changes ; OS: Mild Interval increase in IRF/cystic changes  at 5 wks - recommend IVE OS #11 today, 09.16.22 - pt wishes to proceed with injection - RBA of procedure discussed, questions answered - IVA informed consent obtained and signed, 07.26.21 (OS) - IVE informed consent obtained and signed, 11.22.21 (OS) - see procedure note - [employer] Days approved Eylea until 10/14/2021. - BCBS approved Eylea until 09/04/2021 - f/u in 4-5 wks -- DFE/OCT, possible injection  3,4. Hypertensive retinopathy OU - discussed importance of tight BP control - monitor  5. Mixed Cataract OD - The symptoms of cataract, surgical options, and treatments and risks were discussed with patient. - discussed diagnosis and progression - under the expert management of Dr. Kathlen Mody - scheduled for surgery OD this week  6. Pseudophakia OS  - s/p CE/IOL OS (2020, Dr. Kathlen Mody)  - IOL in good position, doing well  - monitor  7. 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 Susa Simmonds in August 2022  - OS to undergo SuperK at some point w/ [place]  8. History of herpes zoster/iritis OS  - on valrex 1g daily---maintenance   9. POAG OU  - under the expert management of Dr. Kathlen Mody  - IOP 18,15 today  - currently on cosopt BID OU  Ophthalmic Meds Ordered this visit:  Meds ordered this encounter  Medications   aflibercept (EYLEA) SOLN 2 mg       Return for f/u 4-5 weeks, NPDR OU, DFE, OCT.  There are no Patient Instructions on file for this visit.  This document serves as a record of services personally performed by Gardiner Sleeper, MD, PhD. It was created on their behalf by Estill Bakes, COT an ophthalmic technician. The creation of this record is the provider's dictation and/or activities during the visit.    Electronically signed by: Estill Bakes, [place] 9.16.22 @ 9:33 PM   Gardiner Sleeper, M.D., Ph.D. Diseases & Surgery of the Retina and Vitreous [facility]  I have reviewed the above documentation for  accuracy and completeness, and I agree with the above. Aaron Edelman  Antony Haste, M.D., Ph.D. 07/03/21 9:33 PM  Abbreviations: M myopia (nearsighted); A astigmatism; H hyperopia (farsighted); P presbyopia; Mrx spectacle prescription;  CTL contact lenses; OD right eye; OS left eye; OU both eyes  XT exotropia; ET esotropia; PEK punctate epithelial keratitis; PEE punctate epithelial erosions; DES dry eye syndrome; MGD meibomian gland dysfunction; ATs artificial tears; PFAT's preservative free artificial tears; [place] nuclear sclerotic cataract; PSC posterior subcapsular cataract; ERM epi-retinal membrane; PVD posterior vitreous detachment; RD retinal detachment; DM diabetes mellitus; DR diabetic retinopathy; NPDR non-proliferative diabetic retinopathy; PDR proliferative diabetic retinopathy; CSME clinically significant macular edema; DME diabetic macular edema; dbh dot blot hemorrhages; CWS cotton wool spot; POAG primary open angle glaucoma; C/D cup-to-disc ratio; HVF humphrey visual field; GVF goldmann visual field; OCT optical coherence tomography; IOP intraocular pressure; BRVO Branch retinal vein occlusion; CRVO central retinal vein occlusion; CRAO central retinal artery occlusion; BRAO branch retinal artery occlusion; RT retinal tear; SB scleral buckle; PPV pars plana vitrectomy; VH Vitreous hemorrhage; PRP panretinal laser photocoagulation; IVK intravitreal kenalog; VMT vitreomacular traction; MH Macular hole;  NVD neovascularization of the disc; NVE neovascularization elsewhere; AREDS age related eye disease study; ARMD age related macular degeneration; POAG primary open angle glaucoma; EBMD epithelial/anterior basement membrane dystrophy; ACIOL anterior chamber intraocular lens; IOL intraocular lens; PCIOL posterior chamber intraocular lens; Phaco/IOL phacoemulsification with intraocular lens placement; [place] photorefractive keratectomy; LASIK laser assisted in situ keratomileusis; HTN hypertension; DM diabetes mellitus;  COPD chronic obstructive pulmonary disease

## 2021-07-03 ENCOUNTER — Other Ambulatory Visit: Payer: Self-pay

## 2021-07-03 ENCOUNTER — Encounter (INDEPENDENT_AMBULATORY_CARE_PROVIDER_SITE_OTHER): Payer: Self-pay | Admitting: Ophthalmology

## 2021-07-03 ENCOUNTER — Ambulatory Visit (INDEPENDENT_AMBULATORY_CARE_PROVIDER_SITE_OTHER): Payer: Medicare Other | Admitting: Ophthalmology

## 2021-07-03 DIAGNOSIS — I10 Essential (primary) hypertension: Secondary | ICD-10-CM | POA: Diagnosis not present

## 2021-07-03 DIAGNOSIS — H18523 Epithelial (juvenile) corneal dystrophy, bilateral: Secondary | ICD-10-CM

## 2021-07-03 DIAGNOSIS — H401130 Primary open-angle glaucoma, bilateral, stage unspecified: Secondary | ICD-10-CM

## 2021-07-03 DIAGNOSIS — H35033 Hypertensive retinopathy, bilateral: Secondary | ICD-10-CM

## 2021-07-03 DIAGNOSIS — E113213 Type 2 diabetes mellitus with mild nonproliferative diabetic retinopathy with macular edema, bilateral: Secondary | ICD-10-CM | POA: Diagnosis not present

## 2021-07-03 DIAGNOSIS — Z961 Presence of intraocular lens: Secondary | ICD-10-CM

## 2021-07-03 DIAGNOSIS — H25811 Combined forms of age-related cataract, right eye: Secondary | ICD-10-CM | POA: Diagnosis not present

## 2021-07-03 DIAGNOSIS — Z8619 Personal history of other infectious and parasitic diseases: Secondary | ICD-10-CM

## 2021-07-03 DIAGNOSIS — H3581 Retinal edema: Secondary | ICD-10-CM

## 2021-07-03 MED ORDER — AFLIBERCEPT 2MG/0.05ML IZ SOLN FOR KALEIDOSCOPE
2.0000 mg | INTRAVITREAL | Status: AC | PRN
  Administered 2021-07-03: 2 mg via INTRAVITREAL

## 2021-07-04 ENCOUNTER — Ambulatory Visit (HOSPITAL_COMMUNITY)
Admission: RE | Admit: 2021-07-04 | Discharge: 2021-07-04 | Disposition: A | Discharge status: Home / Self Care | Payer: Medicare Other | Source: Ambulatory Visit | Attending: Nurse Practitioner | Admitting: Nurse Practitioner

## 2021-07-04 VITALS — BP 144/78 | HR 65 | Ht 70.0 in | Wt 254.6 lb

## 2021-07-04 DIAGNOSIS — Z888 Allergy status to other drugs, medicaments and biological substances status: Secondary | ICD-10-CM | POA: Diagnosis not present

## 2021-07-04 DIAGNOSIS — I5042 Chronic combined systolic (congestive) and diastolic (congestive) heart failure: Secondary | ICD-10-CM | POA: Diagnosis not present

## 2021-07-04 DIAGNOSIS — I110 Hypertensive heart disease with heart failure: Secondary | ICD-10-CM | POA: Insufficient documentation

## 2021-07-04 DIAGNOSIS — G4733 Obstructive sleep apnea (adult) (pediatric): Secondary | ICD-10-CM | POA: Diagnosis not present

## 2021-07-04 DIAGNOSIS — Z7901 Long term (current) use of anticoagulants: Secondary | ICD-10-CM | POA: Insufficient documentation

## 2021-07-04 DIAGNOSIS — Z79899 Other long term (current) drug therapy: Secondary | ICD-10-CM | POA: Diagnosis not present

## 2021-07-04 DIAGNOSIS — D6869 Other thrombophilia: Secondary | ICD-10-CM | POA: Diagnosis not present

## 2021-07-04 DIAGNOSIS — I4819 Other persistent atrial fibrillation: Secondary | ICD-10-CM | POA: Insufficient documentation

## 2021-07-04 DIAGNOSIS — Z8249 Family history of ischemic heart disease and other diseases of the circulatory system: Secondary | ICD-10-CM | POA: Insufficient documentation

## 2021-07-04 DIAGNOSIS — Z09 Encounter for follow-up examination after completed treatment for conditions other than malignant neoplasm: Secondary | ICD-10-CM | POA: Diagnosis not present

## 2021-07-04 DIAGNOSIS — Z87891 Personal history of nicotine dependence: Secondary | ICD-10-CM | POA: Diagnosis not present

## 2021-07-04 MED ORDER — AMLODIPINE BESYLATE 10 MG PO TABS: 5.0000 mg | ORAL_TABLET | Freq: Every evening | ORAL | Status: DC

## 2021-07-04 MED ORDER — POTASSIUM CHLORIDE CRYS ER 20 MEQ PO TBCR: 20.0000 meq | EXTENDED_RELEASE_TABLET | Freq: Every day | ORAL | 3 refills | Status: DC

## 2021-07-04 MED ORDER — TORSEMIDE 40 MG PO TABS: ORAL_TABLET | ORAL | 3 refills | Status: DC

## 2021-07-04 MED ORDER — CARVEDILOL 6.25 MG PO TABS: ORAL_TABLET | ORAL | Status: DC

## 2021-07-04 NOTE — Progress Notes (Signed)
Primary Care Physician: [name], MD Primary Cardiologist: Gwenlyn Found Primary Electrophysiologist: Genevie Ann is a 80 y.o. male with a history of persistent atrial fibrillation who presents for follow up in the [facility].  Since last being seen in clinic, the patient reports doing very well. afib burden low. No sustained AF. No bleeding issues on [place]. He did lose his wife unexpectedly a couple of months ago and is still grieving over this. He has chronic LLE, which he feels is at his baseline . He states that the legs go  down by the morning and fluid accumulates during the day.   F/u in afib clinic, 07/04/21. He underwent repeat ablation one month ago. He is in rate controlled afib. He states that he went to his PCP a few days after ablation and was in afib then and has persisted  since then. He feels like his weight is up 10 lbs, some from food consumption but feels his abdomen is swollen. He eats out almost every meal since his wife died. He wnet into persistent afib in the spring but was not ready for repeat ablation. He is not favorable to a cardioversion today. He feels very winded. +LLE. Previously failed tikosyn.   Today, he  denies symptoms of palpitations, chest pain, +for shortness of breath,  and lower extremity edema, neg for dizziness, presyncope, syncope, snoring, daytime somnolence, bleeding, or neurologic sequela. The patient is tolerating medications without difficulties and is otherwise without complaint today.    Atrial Fibrillation Risk Factors:  he does have symptoms or diagnosis of sleep apnea. he is compliant with CPAP therapy.  he does not have a history of rheumatic fever.  he does not have a history of alcohol use.  he has a BMI of Body mass index is 36.53 kg/m.[name] Filed Weights   07/04/21 1336  Weight: 115.5 kg    LA size: 40   Atrial Fibrillation Management history:  Previous antiarrhythmic drugs:  Tikosyn  Previous cardioversions: 2016  Previous ablations: 2016  CHADS2VASC score: 5  Anticoagulation history: Eliquis   Past Medical History:  Diagnosis Date   Arthritis    Atrial fibrillation (HCC)    CAD (coronary artery disease)    a. Cath 03/17/15 showing 100% ostial D1, 50% prox LAD to mid LAD, 40% RPDA stenosis. Med rx. // Myoview 01/2020: EF 31 diffuse perfusion defect without reversibility (suspect artifact); reviewed with Dr. Aggie Cosier study felt to be low risk   Cataract    Mixed form OD   Chronic diastolic CHF (congestive heart failure) (HCC)    Diabetic retinopathy ([place])    NPDR OU   Essential hypertension    Glaucoma    POAG OU   History of gout    Hyperlipidemia    Hypertensive retinopathy    OU   Kidney stones    Melanoma of neck (HCC)    NICM (nonischemic cardiomyopathy) ([place])    OSA on CPAP 2012   Prostate cancer ([place])    Type II diabetes mellitus ([place])    Past Surgical History:  Procedure Laterality Date   ABDOMINAL HERNIA REPAIR     w/mesh   ATRIAL FIBRILLATION ABLATION N/A 06/06/2021   Procedure: ATRIAL FIBRILLATION ABLATION;  Surgeon: Thompson Grayer, MD;  Location: [facility];  Service: Cardiovascular;  Laterality: N/A;   CARDIAC CATHETERIZATION N/A 03/17/2015   Procedure: Left Heart Cath and Coronary Angiography;  Surgeon: Lorretta Harp, MD;  Location: [facility];  Service: Cardiovascular;  Laterality: N/A;   carotid doppler  09/17/2008   rigt and left ICAs 0-49%;mildly  abnormal   CATARACT EXTRACTION Left 2020   Dr. Kathlen Mody   COLONOSCOPY N/A 05/16/2017   Procedure: COLONOSCOPY;  Surgeon: Rogene Houston, MD;  [facility];  Service: Endoscopy;  Laterality: N/A;  [place]  05/25/2009   EF 50-55%,LA mildly dilated, LV function normal   ELECTROPHYSIOLOGIC STUDY N/A 04/05/2015   Procedure: Cardioversion;  Surgeon: Sanda Klein, MD;  Location: [facility];  Service: Cardiovascular;   Laterality: N/A;   ELECTROPHYSIOLOGIC STUDY N/A 09/06/2015   Procedure: Atrial Fibrillation Ablation;  Surgeon: Thompson Grayer, MD;  Location: [facility];  Service: Cardiovascular;  Laterality: N/A;   ELECTROPHYSIOLOGIC STUDY N/A 07/12/2016   redo afib ablation by Dr Rayann Heman   EXCISIONAL HEMORRHOIDECTOMY     "inside and out"   EYE SURGERY Left 2020   Cat Sx - Dr. Kathlen Mody   FINE NEEDLE ASPIRATION Right    knee; "drew ~ 1 quart off"   [place] Right    "neck"   NM MYOCAR PERF WALL MOTION  02/21/2012   EF 61% ,EXERCISE 7 METS. exercise stopped due to wheezing and shortness of breathe   POLYPECTOMY  05/16/2017   Procedure: POLYPECTOMY;  Surgeon: Rogene Houston, MD;  [facility];  Service: Endoscopy;;  colon   PROSTATECTOMY     SHOULDER OPEN ROTATOR CUFF REPAIR Right X 2   TEE WITHOUT CARDIOVERSION N/A 09/05/2015   Procedure: TRANSESOPHAGEAL ECHOCARDIOGRAM (TEE);  Surgeon: Sueanne Margarita, MD;  [facility];  Service: Cardiovascular;  Laterality: N/A;    Current Outpatient Medications  Medication Sig Dispense Refill   apixaban (ELIQUIS) 5 MG TABS tablet Take 1 tablet (5 mg total) by mouth 2 (two) times daily. 180 tablet 1   Carboxymethylcellul-Glycerin (LUBRICATING EYE DROPS OP) Apply 1 drop to eye at bedtime.     diphenhydramine-acetaminophen (TYLENOL PM) 25-500 MG TABS tablet Take 2 tablets by mouth at bedtime.     Docusate Calcium (STOOL SOFTENER PO) Take 100 mg by mouth every evening.     dorzolamide-timolol (COSOPT) 22.3-6.8 MG/ML ophthalmic solution Place 1 drop into both eyes 2 (two) times daily. 10 mL 6   gemfibrozil (LOPID) 600 MG tablet Take 600 mg by mouth at bedtime.      glimepiride (AMARYL) 2 MG tablet Take 2 mg by mouth 2 (two) times daily.      Insulin Glargine (BASAGLAR KWIKPEN) 100 UNIT/ML SOPN Inject 38 Units into the skin at bedtime.     lisinopril (ZESTRIL) 40 MG tablet Take 40 mg by  mouth daily.     Melatonin 10 MG TABS Take 10 tablets by mouth at bedtime.     metFORMIN (GLUCOPHAGE-XR) 500 MG 24 hr tablet Take 1,000 mg by mouth every morning.     mineral oil liquid Take 15 mLs by mouth daily as needed for moderate constipation.     Multiple Vitamins-Minerals (MULTIVITAMIN WITH MINERALS) tablet Take 1 tablet by mouth daily. Centrum men 50+     Omega-3 1000 MG CAPS Take 1,000 mg by mouth daily.     pioglitazone (ACTOS) 15 MG tablet Take 15 mg by mouth every morning.     valACYclovir (VALTREX) 1000 MG tablet Take 1,000 mg by mouth at bedtime.     amLODipine (NORVASC) 10 MG tablet  Take 0.5 tablets (5 mg total) by mouth at bedtime.     carvedilol (COREG) 6.25 MG tablet Takes 1 tablet by mouth in the AM and 1 tablet by mouth in the PM     prednisoLONE acetate (PRED FORTE) 1 % ophthalmic suspension Place 1 drop into the right eye in the morning, at noon, in the evening, and at bedtime. (Patient not taking: No sig reported)     prednisoLONE acetate (PRED FORTE) 1 % ophthalmic suspension Place 1 drop into both eyes in the morning and at bedtime. (Patient not taking: Reported on 07/04/2021)     No current facility-administered medications for this encounter.    Allergies  Allergen Reactions   Tape Rash and Other (See Comments)    Causes skin redness, Use paper tape only.    Social History   Socioeconomic History   Marital status: Married    Spouse name: Not on file   Number of children: Not on file   Years of education: Not on file   Highest education level: Not on file  Occupational History   Occupation: Retired  Tobacco Use   Smoking status: Former    Packs/day: 2.00    Years: 27.00    Pack years: 54.00    Types: Cigarettes    Quit date: 10/31/1983    Years since quitting: 37.7   Smokeless tobacco: Never  Vaping Use   Vaping Use: Never used  Substance and Sexual Activity   Alcohol use: No    Alcohol/week: 0.0 standard drinks    Comment: "used to drink;  stopped ~ 2008"   Drug use: No   Sexual activity: Not on file  Other Topics Concern   Not on file  Social History Narrative   Lives in [place], [place] with wife.   Social Determinants of Health   Financial Resource Strain: Not on file  Food Insecurity: Not on file  Transportation Needs: Not on file  Physical Activity: Not on file  Stress: Not on file  Social Connections: Not on file  Intimate Partner Violence: Not on file    Family History  Problem Relation Age of Onset   Heart disease Mother    Lung cancer Mother    Heart attack Mother 54   Stroke Brother    Healthy Daughter    Diabetes Father    Heart disease Father     ROS- All systems are reviewed and negative except as per the HPI above.  Physical Exam: Vitals:   07/04/21 1336  BP: (!) 144/78  Pulse: 65  Weight: 115.5 kg  Height: 5\' 10"  (1.778 m)    GEN- The patient is well appearing, alert and oriented x 3 today.   Head- normocephalic, atraumatic Eyes-  Sclera clear, conjunctiva pink Ears- hearing intact Oropharynx- clear Neck- supple  Lungs- Clear to ausculation bilaterally, normal work of breathing Heart- irregular rate and rhythm  GI- soft, NT, ND, + BS Extremities- no clubbing, cyanosis, or  2+ edema  MS- no significant deformity or atrophy Skin- no rash or lesion Psych- euthymic mood, full affect Neuro- strength and sensation are intact  Wt Readings from Last 3 Encounters:  07/04/21 115.5 kg  06/06/21 115.2 kg  04/14/21 116.8 kg    EKG today demonstrates  afib at 65 bpm,   qrs int 106 ms, qtc 434 ms  Epic records are reviewed at length today  Assessment and Plan:  1. Persistent atrial fibrillation  S/p ablation one month ago and SR only lasted  a few days He does not favor a cardioversion at this time  He is concerned re fluid   He has been on 60 mg lasix for some time Will change to torsemide 40 mg a day for better diuresis  Add 20 meq K+ daily Weigh daily Continue Eliquis 5 mg bid   for CHADS2VASC of 5  2. HTN Stable   3. Obstructive sleep apnea The patient reports compliance with CPAP   4. Combined systolic /diastolic HF   Change in diuretic as above   Follow up here in one week  Roderic Palau, NP 07/04/2021 2:48 PM

## 2021-07-04 NOTE — Patient Instructions (Signed)
Stop Lasix Start Torsemide- 40mg - Take one tablet by mouth daily Start [place] 45meq- Take one tablet by mouth daily

## 2021-07-12 ENCOUNTER — Encounter (HOSPITAL_COMMUNITY): Payer: Self-pay | Admitting: Nurse Practitioner

## 2021-07-12 ENCOUNTER — Other Ambulatory Visit: Payer: Self-pay

## 2021-07-12 ENCOUNTER — Ambulatory Visit (HOSPITAL_COMMUNITY)
Admission: RE | Admit: 2021-07-12 | Discharge: 2021-07-12 | Disposition: A | Discharge status: Home / Self Care | Payer: Medicare Other | Source: Ambulatory Visit | Attending: Nurse Practitioner | Admitting: Nurse Practitioner

## 2021-07-12 VITALS — BP 136/66 | HR 64 | Ht 70.0 in | Wt 256.2 lb

## 2021-07-12 DIAGNOSIS — Z79899 Other long term (current) drug therapy: Secondary | ICD-10-CM | POA: Diagnosis not present

## 2021-07-12 DIAGNOSIS — I5032 Chronic diastolic (congestive) heart failure: Secondary | ICD-10-CM | POA: Insufficient documentation

## 2021-07-12 DIAGNOSIS — Z794 Long term (current) use of insulin: Secondary | ICD-10-CM | POA: Insufficient documentation

## 2021-07-12 DIAGNOSIS — Z9079 Acquired absence of other genital organ(s): Secondary | ICD-10-CM | POA: Diagnosis not present

## 2021-07-12 DIAGNOSIS — I4819 Other persistent atrial fibrillation: Secondary | ICD-10-CM | POA: Diagnosis present

## 2021-07-12 DIAGNOSIS — D6869 Other thrombophilia: Secondary | ICD-10-CM

## 2021-07-12 DIAGNOSIS — I110 Hypertensive heart disease with heart failure: Secondary | ICD-10-CM | POA: Insufficient documentation

## 2021-07-12 DIAGNOSIS — G4733 Obstructive sleep apnea (adult) (pediatric): Secondary | ICD-10-CM | POA: Insufficient documentation

## 2021-07-12 DIAGNOSIS — Z888 Allergy status to other drugs, medicaments and biological substances status: Secondary | ICD-10-CM | POA: Diagnosis not present

## 2021-07-12 DIAGNOSIS — Z7984 Long term (current) use of oral hypoglycemic drugs: Secondary | ICD-10-CM | POA: Diagnosis not present

## 2021-07-12 DIAGNOSIS — Z8249 Family history of ischemic heart disease and other diseases of the circulatory system: Secondary | ICD-10-CM | POA: Insufficient documentation

## 2021-07-12 DIAGNOSIS — Z7901 Long term (current) use of anticoagulants: Secondary | ICD-10-CM | POA: Insufficient documentation

## 2021-07-12 LAB — BASIC METABOLIC PANEL
Anion gap: 9 (ref 5–15)
BUN: 19 mg/dL (ref 8–23)
CO2: 30 mmol/L (ref 22–32)
Calcium: 8.8 mg/dL — ABNORMAL LOW (ref 8.9–10.3)
Chloride: 97 mmol/L — ABNORMAL LOW (ref 98–111)
Creatinine, Ser: 1.38 mg/dL — ABNORMAL HIGH (ref 0.61–1.24)
GFR, Estimated: 52 mL/min — ABNORMAL LOW (ref >60)
Glucose, Bld: 254 mg/dL — ABNORMAL HIGH (ref 70–99)
Potassium: 4 mmol/L (ref 3.5–5.1)
Sodium: 136 mmol/L (ref 135–145)

## 2021-07-12 MED ORDER — FUROSEMIDE 40 MG PO TABS: 40.0000 mg | ORAL_TABLET | Freq: Two times a day (BID) | ORAL | 2 refills | Status: DC

## 2021-07-12 NOTE — Progress Notes (Signed)
Primary Care Physician: [name], MD Primary Cardiologist: Gwenlyn Found Primary Electrophysiologist: Genevie Ann is a 80 y.o. male with a history of persistent atrial fibrillation who presents for follow up in the [facility].  Since last being seen in clinic, the patient reports doing very well. afib burden low. No sustained AF. No bleeding issues on [place]. He did lose his wife unexpectedly a couple of months ago and is still grieving over this. He has chronic LLE, which he feels is at his baseline . He states that the legs go  down by the morning and fluid accumulates during the day.   F/u in afib clinic, 07/04/21. He underwent repeat ablation one month ago. He is in rate controlled afib. He states that he went to his PCP a few days after ablation and was in afib then and has persisted  since then. He feels like his weight is up 10 lbs, some from food consumption but feels his abdomen is swollen. He eats out almost every meal since his wife died. He wnet into persistent afib in the spring but was not ready for repeat ablation. He is not favorable to a cardioversion today. He feels very winded. +LLE. Previously failed tikosyn.   F/u in afib clinic, 07/12/21. He remains in rate controlled afib. He still does not want a cardioversion. I switched him to torsemide on last visit for some fluid/weight gain. Previously;y on 60 mg lasix daily. He states that it was expensive, his weight is up another 2 lbs and he feels that he did better on lasix. He states that his legs are much less swollen  in the am, and he feels good.   Today, he  denies symptoms of palpitations, chest pain, +for shortness of breath,  and lower extremity edema, neg for dizziness, presyncope, syncope, snoring, daytime somnolence, bleeding, or neurologic sequela. The patient is tolerating medications without difficulties and is otherwise without complaint today.    Atrial Fibrillation Risk  Factors:  he does have symptoms or diagnosis of sleep apnea. he is compliant with CPAP therapy.  he does not have a history of rheumatic fever.  he does not have a history of alcohol use.  he has a BMI of Body mass index is 36.76 kg/m.[name] Filed Weights   07/12/21 1346  Weight: 116.2 kg    LA size: 40   Atrial Fibrillation Management history:  Previous antiarrhythmic drugs: Tikosyn  Previous cardioversions: 2016   Previous ablations: 2016. 06/06/21  CHADS2VASC score: 5  Anticoagulation history: Eliquis   Past Medical History:  Diagnosis Date   Arthritis    Atrial fibrillation (HCC)    CAD (coronary artery disease)    a. Cath 03/17/15 showing 100% ostial D1, 50% prox LAD to mid LAD, 40% RPDA stenosis. Med rx. // Myoview 01/2020: EF 31 diffuse perfusion defect without reversibility (suspect artifact); reviewed with Dr. Aggie Cosier study felt to be low risk   Cataract    Mixed form OD   Chronic diastolic CHF (congestive heart failure) (HCC)    Diabetic retinopathy ([place])    NPDR OU   Essential hypertension    Glaucoma    POAG OU   History of gout    Hyperlipidemia    Hypertensive retinopathy    OU   Kidney stones    Melanoma of neck (HCC)    NICM (nonischemic cardiomyopathy) ([place])    OSA on CPAP 2012   Prostate cancer ([place])    Type  II diabetes mellitus ([place])    Past Surgical History:  Procedure Laterality Date   ABDOMINAL HERNIA REPAIR     w/mesh   ATRIAL FIBRILLATION ABLATION N/A 06/06/2021   Procedure: ATRIAL FIBRILLATION ABLATION;  Surgeon: Thompson Grayer, MD;  Location: [facility];  Service: Cardiovascular;  Laterality: N/A;   CARDIAC CATHETERIZATION N/A 03/17/2015   Procedure: Left Heart Cath and Coronary Angiography;  Surgeon: Lorretta Harp, MD;  Location: [facility];  Service: Cardiovascular;  Laterality: N/A;   carotid doppler  09/17/2008   rigt and left ICAs 0-49%;mildly  abnormal   CATARACT EXTRACTION Left 2020   Dr. Kathlen Mody    COLONOSCOPY N/A 05/16/2017   Procedure: COLONOSCOPY;  Surgeon: Rogene Houston, MD;  [facility];  Service: Endoscopy;  Laterality: N/A;  [place]  05/25/2009   EF 50-55%,LA mildly dilated, LV function normal   ELECTROPHYSIOLOGIC STUDY N/A 04/05/2015   Procedure: Cardioversion;  Surgeon: Sanda Klein, MD;  Location: [facility];  Service: Cardiovascular;  Laterality: N/A;   ELECTROPHYSIOLOGIC STUDY N/A 09/06/2015   Procedure: Atrial Fibrillation Ablation;  Surgeon: Thompson Grayer, MD;  Location: [facility];  Service: Cardiovascular;  Laterality: N/A;   ELECTROPHYSIOLOGIC STUDY N/A 07/12/2016   redo afib ablation by Dr Rayann Heman   EXCISIONAL HEMORRHOIDECTOMY     "inside and out"   EYE SURGERY Left 2020   Cat Sx - Dr. Kathlen Mody   FINE NEEDLE ASPIRATION Right    knee; "drew ~ 1 quart off"   [place] Right    "neck"   NM MYOCAR PERF WALL MOTION  02/21/2012   EF 61% ,EXERCISE 7 METS. exercise stopped due to wheezing and shortness of breathe   POLYPECTOMY  05/16/2017   Procedure: POLYPECTOMY;  Surgeon: Rogene Houston, MD;  [facility];  Service: Endoscopy;;  colon   PROSTATECTOMY     SHOULDER OPEN ROTATOR CUFF REPAIR Right X 2   TEE WITHOUT CARDIOVERSION N/A 09/05/2015   Procedure: TRANSESOPHAGEAL ECHOCARDIOGRAM (TEE);  Surgeon: Sueanne Margarita, MD;  Location: [facility] ENDOSCOPY;  Service: Cardiovascular;  Laterality: N/A;    Current Outpatient Medications  Medication Sig Dispense Refill   amLODipine (NORVASC) 10 MG tablet Take 0.5 tablets (5 mg total) by mouth at bedtime. (Patient taking differently: Take 10 mg by mouth at bedtime.)     apixaban (ELIQUIS) 5 MG TABS tablet Take 1 tablet (5 mg total) by mouth 2 (two) times daily. 180 tablet 1   Carboxymethylcellul-Glycerin (LUBRICATING EYE DROPS OP) Apply 1 drop to eye at bedtime.     carvedilol (COREG) 6.25 MG tablet Takes 1 tablet by  mouth in the AM and 1 tablet by mouth in the PM     diphenhydramine-acetaminophen (TYLENOL PM) 25-500 MG TABS tablet Take 2 tablets by mouth at bedtime.     Docusate Calcium (STOOL SOFTENER PO) Take 100 mg by mouth every evening.     dorzolamide-timolol (COSOPT) 22.3-6.8 MG/ML ophthalmic solution Place 1 drop into both eyes 2 (two) times daily. 10 mL 6   gemfibrozil (LOPID) 600 MG tablet Take 600 mg by mouth at bedtime.      glimepiride (AMARYL) 2 MG tablet Take 2 mg by mouth 2 (two) times daily.      Insulin Glargine (BASAGLAR KWIKPEN) 100 UNIT/ML SOPN Inject 38 Units into the skin at bedtime.     lisinopril (ZESTRIL) 40  MG tablet Take 40 mg by mouth daily.     Melatonin 10 MG TABS Take 10 tablets by mouth at bedtime.     metFORMIN (GLUCOPHAGE-XR) 500 MG 24 hr tablet Take 1,000 mg by mouth every morning.     mineral oil liquid Take 15 mLs by mouth daily as needed for moderate constipation.     Multiple Vitamins-Minerals (MULTIVITAMIN WITH MINERALS) tablet Take 1 tablet by mouth daily. Centrum men 50+     Omega-3 1000 MG CAPS Take 1,000 mg by mouth daily.     pioglitazone (ACTOS) 15 MG tablet Take 15 mg by mouth every morning.     potassium chloride SA (KLOR-CON M20) 20 MEQ tablet Take 1 tablet (20 mEq total) by mouth daily. 30 tablet 3   prednisoLONE acetate (PRED FORTE) 1 % ophthalmic suspension Place 1 drop into the right eye in the morning, at noon, in the evening, and at bedtime.     prednisoLONE acetate (PRED FORTE) 1 % ophthalmic suspension Place 1 drop into both eyes in the morning and at bedtime.     valACYclovir (VALTREX) 1000 MG tablet Take 1,000 mg by mouth at bedtime.     furosemide (LASIX) 40 MG tablet Take 1 tablet (40 mg total) by mouth 2 (two) times daily. 60 tablet 2   No current facility-administered medications for this encounter.    Allergies  Allergen Reactions   Tape Rash and Other (See Comments)    Causes skin redness, Use paper tape only.    Social History    Socioeconomic History   Marital status: Married    Spouse name: Not on file   Number of children: Not on file   Years of education: Not on file   Highest education level: Not on file  Occupational History   Occupation: Retired  Tobacco Use   Smoking status: Former    Packs/day: 2.00    Years: 27.00    Pack years: 54.00    Types: Cigarettes    Quit date: 10/31/1983    Years since quitting: 37.7   Smokeless tobacco: Never  Vaping Use   Vaping Use: Never used  Substance and Sexual Activity   Alcohol use: No    Alcohol/week: 0.0 standard drinks    Comment: "used to drink; stopped ~ 2008"   Drug use: No   Sexual activity: Not on file  Other Topics Concern   Not on file  Social History Narrative   Lives in [place], [place] with wife.   Social Determinants of Health   Financial Resource Strain: Not on file  Food Insecurity: Not on file  Transportation Needs: Not on file  Physical Activity: Not on file  Stress: Not on file  Social Connections: Not on file  Intimate Partner Violence: Not on file    Family History  Problem Relation Age of Onset   Heart disease Mother    Lung cancer Mother    Heart attack Mother 79   Stroke Brother    Healthy Daughter    Diabetes Father    Heart disease Father     ROS- All systems are reviewed and negative except as per the HPI above.  Physical Exam: Vitals:   07/12/21 1346  BP: 136/66  Pulse: 64  Weight: 116.2 kg  Height: 5\' 10"  (1.778 m)    GEN- The patient is well appearing, alert and oriented x 3 today.   Head- normocephalic, atraumatic Eyes-  Sclera clear, conjunctiva pink Ears- hearing intact Oropharynx- clear Neck-  supple  Lungs- Clear to ausculation bilaterally, normal work of breathing Heart- irregular rate and rhythm  GI- soft, NT, ND, + BS Extremities- no clubbing, cyanosis, or  2+ edema  MS- no significant deformity or atrophy Skin- no rash or lesion Psych- euthymic mood, full affect Neuro- strength and  sensation are intact  Wt Readings from Last 3 Encounters:  07/12/21 116.2 kg  07/04/21 115.5 kg  06/06/21 115.2 kg    EKG today demonstrates  afib at 65 bpm,   qrs int 106 ms, qtc 434 ms  Epic records are reviewed at length today  Assessment and Plan:  1. Persistent atrial fibrillation  S/p ablation one month ago and SR only lasted a few days He does not favor a cardioversion at this time  He is concerned re fluid   He has been on 60 mg lasix for some time I changed to torsemide 40 mg a day for better diuresis and  added 20 meq K+ daily He did not see any improvement and in fact his seight is increased by 2 lbs He states that torsemide is expensive and he wants to go back to lasix I will increase to 40 mg bid and stay on current K+ Weigh daily Avoid salt  Continue Eliquis 5 mg bid  for CHADS2VASC of 5 Bmet today   2. HTN Stable   3. Obstructive sleep apnea The patient reports compliance with CPAP   4. Combined systolic /diastolic HF   Change in diuretic as above   Follow up with Dr.Allred as pt with recent ablation with ERAF, rate controlled, but does not want to pursue cardioversion   Roderic Palau, NP 07/12/2021 4:57 PM

## 2021-07-12 NOTE — Patient Instructions (Addendum)
Stop torsemide   Start Lasix (furosemide) 40mg  twice a day  Continue potassium

## 2021-07-26 ENCOUNTER — Ambulatory Visit: Payer: Medicare Other | Admitting: Internal Medicine

## 2021-07-26 ENCOUNTER — Other Ambulatory Visit: Payer: Self-pay

## 2021-07-26 VITALS — BP 118/64 | HR 71 | Ht 70.0 in | Wt 260.4 lb

## 2021-07-26 DIAGNOSIS — I5032 Chronic diastolic (congestive) heart failure: Secondary | ICD-10-CM

## 2021-07-26 DIAGNOSIS — G4733 Obstructive sleep apnea (adult) (pediatric): Secondary | ICD-10-CM | POA: Diagnosis not present

## 2021-07-26 DIAGNOSIS — Z9989 Dependence on other enabling machines and devices: Secondary | ICD-10-CM

## 2021-07-26 DIAGNOSIS — I10 Essential (primary) hypertension: Secondary | ICD-10-CM

## 2021-07-26 DIAGNOSIS — I4819 Other persistent atrial fibrillation: Secondary | ICD-10-CM | POA: Diagnosis not present

## 2021-07-26 DIAGNOSIS — I25119 Atherosclerotic heart disease of native coronary artery with unspecified angina pectoris: Secondary | ICD-10-CM | POA: Diagnosis not present

## 2021-07-26 NOTE — Patient Instructions (Signed)
Medication Instructions:  Your physician recommends that you continue on your current medications as directed. Please refer to the Current Medication list given to you today. *If you need a refill on your cardiac medications before your next appointment, please call your pharmacy*  Lab Work: None. If you have labs (blood work) drawn today and your tests are completely normal, you will receive your results only by: [place] (if you have MyChart) OR A paper copy in the mail If you have any lab test that is abnormal or we need to change your treatment, we will call you to review the results.  Testing/Procedures: None.  Follow-Up: At [facility], you and your health needs are our priority.  As part of our continuing mission to provide you with exceptional heart care, we have created designated Provider Care Teams.  These Care Teams include your primary Cardiologist (physician) and Advanced Practice Providers (APPs -  Physician Assistants and Nurse Practitioners) who all work together to provide you with the care you need, when you need it.  Your physician wants you to follow-up in: Cardioversion on Oct 26, then follow up with the Afib Clinic. They will contact you to schedule.   We recommend signing up for the patient portal called "MyChart".  Sign up information is provided on this After Visit Summary.  MyChart is used to connect with patients for Virtual Visits (Telemedicine).  Patients are able to view lab/test results, encounter notes, upcoming appointments, etc.  Non-urgent messages can be sent to your provider as well.   To learn more about what you can do with MyChart, go to [email or web address].    Any Other Special Instructions Will Be Listed Below (If Applicable).  You are scheduled for a Cardioversion on August 09, 2021 with Dr. Oval Linsey.  Please arrive at the [facility] (Main Entrance A) at [facility]: [address] [place], [place] [address] at 9 am.  DIET:  Nothing to eat or drink after midnight except a sip of water with medications (see medication instructions below)  FYI: For your safety, and to allow [place] to monitor your vital signs accurately during the surgery/procedure we request that   if you have artificial nails, gel coating, SNS etc. Please have those removed prior to your surgery/procedure. Not having the nail coverings /polish removed may result in cancellation or delay of your surgery/procedure.   Medication Instructions: Hold Lasix, Metformin, and Glimepiride  Continue your anticoagulant: Eliquis You will need to continue your anticoagulant after your procedure until you  are told by your  Provider that it is safe to stop  You must have a responsible person to drive you home and stay in the waiting area during your procedure. Failure to do so could result in cancellation.  Bring your insurance cards.  *Special Note: Every effort is made to have your procedure done on time. Occasionally there are emergencies that occur at the hospital that may cause delays. Please be patient if a delay does occur.

## 2021-07-26 NOTE — H&P (View-Only) (Signed)
PCP: [name], MD Primary Cardiologist: Dr Gwenlyn Found Primary EP: Dr Genevie Ann is a 80 y.o. male who presents today for routine electrophysiology followup.  Since his last visit to the AF clinic, the patient reports doing reasonably well.  He has had ERAF.  + edema.  SOB with moderate activity.  Today, he denies symptoms of palpitations, chest pain, dizziness, presyncope, or syncope.  The patient is otherwise without complaint today.   Past Medical History:  Diagnosis Date   Arthritis    Atrial fibrillation (HCC)    CAD (coronary artery disease)    a. Cath 03/17/15 showing 100% ostial D1, 50% prox LAD to mid LAD, 40% RPDA stenosis. Med rx. // Myoview 01/2020: EF 31 diffuse perfusion defect without reversibility (suspect artifact); reviewed with Dr. Aggie Cosier study felt to be low risk   Cataract    Mixed form OD   Chronic diastolic CHF (congestive heart failure) (HCC)    Diabetic retinopathy ([place])    NPDR OU   Essential hypertension    Glaucoma    POAG OU   History of gout    Hyperlipidemia    Hypertensive retinopathy    OU   Kidney stones    Melanoma of neck (HCC)    NICM (nonischemic cardiomyopathy) ([place])    OSA on CPAP 2012   Prostate cancer ([place])    Type II diabetes mellitus ([place])    Past Surgical History:  Procedure Laterality Date   ABDOMINAL HERNIA REPAIR     w/mesh   ATRIAL FIBRILLATION ABLATION N/A 06/06/2021   Procedure: ATRIAL FIBRILLATION ABLATION;  Surgeon: Thompson Grayer, MD;  Location: [place] [facility];  Service: Cardiovascular;  Laterality: N/A;   CARDIAC CATHETERIZATION N/A 03/17/2015   Procedure: Left Heart Cath and Coronary Angiography;  Surgeon: Lorretta Harp, MD;  Location: [place] [facility];  Service: Cardiovascular;  Laterality: N/A;   carotid doppler  09/17/2008   rigt and left ICAs 0-49%;mildly  abnormal   CATARACT EXTRACTION Left 2020   Dr. Kathlen Mody   COLONOSCOPY N/A 05/16/2017   Procedure: COLONOSCOPY;  Surgeon: Rogene Houston,  MD;  [facility];  Service: Endoscopy;  Laterality: N/A;  [place]  05/25/2009   EF 50-55%,LA mildly dilated, LV function normal   ELECTROPHYSIOLOGIC STUDY N/A 04/05/2015   Procedure: Cardioversion;  Surgeon: Sanda Klein, MD;  Location: [place] [facility];  Service: Cardiovascular;  Laterality: N/A;   ELECTROPHYSIOLOGIC STUDY N/A 09/06/2015   Procedure: Atrial Fibrillation Ablation;  Surgeon: Thompson Grayer, MD;  Location: [place] [facility];  Service: Cardiovascular;  Laterality: N/A;   ELECTROPHYSIOLOGIC STUDY N/A 07/12/2016   [facility] afib ablation by Dr Rayann Heman   EXCISIONAL HEMORRHOIDECTOMY     "inside and out"   EYE SURGERY Left 2020   Cat Sx - Dr. Kathlen Mody   FINE NEEDLE ASPIRATION Right    knee; "drew ~ 1 quart off"   [place] Right    "neck"   NM MYOCAR PERF WALL MOTION  02/21/2012   EF 61% ,EXERCISE 7 METS. exercise stopped due to wheezing and shortness of breathe   POLYPECTOMY  05/16/2017   Procedure: POLYPECTOMY;  Surgeon: Rogene Houston, MD;  [facility];  Service: Endoscopy;;  colon   PROSTATECTOMY     SHOULDER OPEN ROTATOR CUFF REPAIR Right X 2   TEE WITHOUT CARDIOVERSION N/A 09/05/2015   Procedure:  TRANSESOPHAGEAL ECHOCARDIOGRAM (TEE);  Surgeon: Sueanne Margarita, MD;  Location: [facility];  Service: Cardiovascular;  Laterality: N/A;    ROS- all systems are reviewed and negatives except as per HPI above  Current Outpatient Medications  Medication Sig Dispense Refill   amLODipine (NORVASC) 10 MG tablet Take 0.5 tablets (5 mg total) by mouth at bedtime. (Patient taking differently: Take 10 mg by mouth at bedtime.)     apixaban (ELIQUIS) 5 MG TABS tablet Take 1 tablet (5 mg total) by mouth 2 (two) times daily. 180 tablet 1   Carboxymethylcellul-Glycerin (LUBRICATING EYE DROPS OP) Apply 1 drop to eye at bedtime.     carvedilol (COREG) 6.25 MG tablet Takes 1 tablet by mouth in  the AM and 1 tablet by mouth in the PM     diphenhydramine-acetaminophen (TYLENOL PM) 25-500 MG TABS tablet Take 2 tablets by mouth at bedtime.     Docusate Calcium (STOOL SOFTENER PO) Take 100 mg by mouth every evening.     dorzolamide-timolol (COSOPT) 22.3-6.8 MG/ML ophthalmic solution Place 1 drop into both eyes 2 (two) times daily. 10 mL 6   furosemide (LASIX) 40 MG tablet Take 1 tablet (40 mg total) by mouth 2 (two) times daily. 60 tablet 2   gemfibrozil (LOPID) 600 MG tablet Take 600 mg by mouth at bedtime.      glimepiride (AMARYL) 2 MG tablet Take 2 mg by mouth 2 (two) times daily.      Insulin Glargine (BASAGLAR KWIKPEN) 100 UNIT/ML SOPN Inject 38 Units into the skin at bedtime.     lisinopril (ZESTRIL) 40 MG tablet Take 40 mg by mouth daily.     Melatonin 10 MG TABS Take 10 tablets by mouth at bedtime.     metFORMIN (GLUCOPHAGE-XR) 500 MG 24 hr tablet Take 1,000 mg by mouth every morning.     mineral oil liquid Take 15 mLs by mouth daily as needed for moderate constipation.     Multiple Vitamins-Minerals (MULTIVITAMIN WITH MINERALS) tablet Take 1 tablet by mouth daily. Centrum men 50+     Omega-3 1000 MG CAPS Take 1,000 mg by mouth daily.     pioglitazone (ACTOS) 15 MG tablet Take 15 mg by mouth every morning.     potassium chloride SA (KLOR-CON M20) 20 MEQ tablet Take 1 tablet (20 mEq total) by mouth daily. 30 tablet 3   prednisoLONE acetate (PRED FORTE) 1 % ophthalmic suspension Place 1 drop into the right eye in the morning, at noon, in the evening, and at bedtime.     prednisoLONE acetate (PRED FORTE) 1 % ophthalmic suspension Place 1 drop into both eyes in the morning and at bedtime.     valACYclovir (VALTREX) 1000 MG tablet Take 1,000 mg by mouth at bedtime.     No current facility-administered medications for this visit.    Physical Exam: Vitals:   07/26/21 1054  BP: 118/64  Pulse: 71  SpO2: 92%  Weight: 260 lb 6.4 oz (118.1 kg)  Height: 5\' 10"  (1.778 m)    GEN- The  patient is well appearing, alert and oriented x 3 today.   Head- normocephalic, atraumatic Eyes-  Sclera clear, conjunctiva pink Ears- hearing intact Oropharynx- clear Lungs-  normal work of breathing Heart- iRRR GI- soft, NT, ND, + BS Extremities- no clubbing, cyanosis, + edema  Wt Readings from Last 3 Encounters:  07/26/21 260 lb 6.4 oz (118.1 kg)  07/12/21 256 lb 3.2 oz (116.2 kg)  07/04/21 254 lb 9.6 oz (115.5  kg)    EKG tracing ordered today is personally reviewed and shows afib  Assessment and Plan:  Persistent afib He has had ERAF post ablation I have advised cardioversion.  He is now willing to reconsider.  If he fails cardioversion, we will need to then consider AAD options.  Risks and benefits to [facility] discussed with the patient who wishes to proceed. Continue eliquis  2. Acute on chronic diastolic dysfunction I have advised that he take lasix 40mg  BID Sodium restriction advised Would screen for alleviate HF on return to AF clinic Pro bnp recent elevated  3. CAD No ischemic symptoms  4. HTN Stable No change required today  5. OSA Compliance with CPAP advised  Risks, benefits and potential toxicities for medications prescribed and/or refilled reviewed with patient today.   Very complicated patient.  A high level of decision making was required for the visit today.  He is at risk for worsening CHF and hospitalization.  Thompson Grayer MD, [facility] 07/26/2021 11:05 AM

## 2021-07-26 NOTE — Progress Notes (Signed)
PCP: [name], MD Primary Cardiologist: Dr Gwenlyn Found Primary EP: Dr Genevie Ann is a 80 y.o. male who presents today for routine electrophysiology followup.  Since his last visit to the AF clinic, the patient reports doing reasonably well.  He has had ERAF.  + edema.  SOB with moderate activity.  Today, he denies symptoms of palpitations, chest pain, dizziness, presyncope, or syncope.  The patient is otherwise without complaint today.   Past Medical History:  Diagnosis Date   Arthritis    Atrial fibrillation (HCC)    CAD (coronary artery disease)    a. Cath 03/17/15 showing 100% ostial D1, 50% prox LAD to mid LAD, 40% RPDA stenosis. Med rx. // Myoview 01/2020: EF 31 diffuse perfusion defect without reversibility (suspect artifact); reviewed with Dr. Aggie Cosier study felt to be low risk   Cataract    Mixed form OD   Chronic diastolic CHF (congestive heart failure) (HCC)    Diabetic retinopathy ([place])    NPDR OU   Essential hypertension    Glaucoma    POAG OU   History of gout    Hyperlipidemia    Hypertensive retinopathy    OU   Kidney stones    Melanoma of neck (HCC)    NICM (nonischemic cardiomyopathy) ([place])    OSA on CPAP 2012   Prostate cancer ([place])    Type II diabetes mellitus ([place])    Past Surgical History:  Procedure Laterality Date   ABDOMINAL HERNIA REPAIR     w/mesh   ATRIAL FIBRILLATION ABLATION N/A 06/06/2021   Procedure: ATRIAL FIBRILLATION ABLATION;  Surgeon: Thompson Grayer, MD;  Location: [place] [facility];  Service: Cardiovascular;  Laterality: N/A;   CARDIAC CATHETERIZATION N/A 03/17/2015   Procedure: Left Heart Cath and Coronary Angiography;  Surgeon: Lorretta Harp, MD;  Location: [place] [facility];  Service: Cardiovascular;  Laterality: N/A;   carotid doppler  09/17/2008   rigt and left ICAs 0-49%;mildly  abnormal   CATARACT EXTRACTION Left 2020   Dr. Kathlen Mody   COLONOSCOPY N/A 05/16/2017   Procedure: COLONOSCOPY;  Surgeon: Rogene Houston,  MD;  [facility];  Service: Endoscopy;  Laterality: N/A;  [place]  05/25/2009   EF 50-55%,LA mildly dilated, LV function normal   ELECTROPHYSIOLOGIC STUDY N/A 04/05/2015   Procedure: Cardioversion;  Surgeon: Sanda Klein, MD;  Location: [place] [facility];  Service: Cardiovascular;  Laterality: N/A;   ELECTROPHYSIOLOGIC STUDY N/A 09/06/2015   Procedure: Atrial Fibrillation Ablation;  Surgeon: Thompson Grayer, MD;  Location: [place] [facility];  Service: Cardiovascular;  Laterality: N/A;   ELECTROPHYSIOLOGIC STUDY N/A 07/12/2016   [facility] afib ablation by Dr Rayann Heman   EXCISIONAL HEMORRHOIDECTOMY     "inside and out"   EYE SURGERY Left 2020   Cat Sx - Dr. Kathlen Mody   FINE NEEDLE ASPIRATION Right    knee; "drew ~ 1 quart off"   [place] Right    "neck"   NM MYOCAR PERF WALL MOTION  02/21/2012   EF 61% ,EXERCISE 7 METS. exercise stopped due to wheezing and shortness of breathe   POLYPECTOMY  05/16/2017   Procedure: POLYPECTOMY;  Surgeon: Rogene Houston, MD;  [facility];  Service: Endoscopy;;  colon   PROSTATECTOMY     SHOULDER OPEN ROTATOR CUFF REPAIR Right X 2   TEE WITHOUT CARDIOVERSION N/A 09/05/2015   Procedure:  TRANSESOPHAGEAL ECHOCARDIOGRAM (TEE);  Surgeon: Sueanne Margarita, MD;  Location: [facility] ENDOSCOPY;  Service: Cardiovascular;  Laterality: N/A;    ROS- all systems are reviewed and negatives except as per HPI above  Current Outpatient Medications  Medication Sig Dispense Refill   amLODipine (NORVASC) 10 MG tablet Take 0.5 tablets (5 mg total) by mouth at bedtime. (Patient taking differently: Take 10 mg by mouth at bedtime.)     apixaban (ELIQUIS) 5 MG TABS tablet Take 1 tablet (5 mg total) by mouth 2 (two) times daily. 180 tablet 1   Carboxymethylcellul-Glycerin (LUBRICATING EYE DROPS OP) Apply 1 drop to eye at bedtime.     carvedilol (COREG) 6.25 MG tablet Takes 1 tablet by mouth in  the AM and 1 tablet by mouth in the PM     diphenhydramine-acetaminophen (TYLENOL PM) 25-500 MG TABS tablet Take 2 tablets by mouth at bedtime.     Docusate Calcium (STOOL SOFTENER PO) Take 100 mg by mouth every evening.     dorzolamide-timolol (COSOPT) 22.3-6.8 MG/ML ophthalmic solution Place 1 drop into both eyes 2 (two) times daily. 10 mL 6   furosemide (LASIX) 40 MG tablet Take 1 tablet (40 mg total) by mouth 2 (two) times daily. 60 tablet 2   gemfibrozil (LOPID) 600 MG tablet Take 600 mg by mouth at bedtime.      glimepiride (AMARYL) 2 MG tablet Take 2 mg by mouth 2 (two) times daily.      Insulin Glargine (BASAGLAR KWIKPEN) 100 UNIT/ML SOPN Inject 38 Units into the skin at bedtime.     lisinopril (ZESTRIL) 40 MG tablet Take 40 mg by mouth daily.     Melatonin 10 MG TABS Take 10 tablets by mouth at bedtime.     metFORMIN (GLUCOPHAGE-XR) 500 MG 24 hr tablet Take 1,000 mg by mouth every morning.     mineral oil liquid Take 15 mLs by mouth daily as needed for moderate constipation.     Multiple Vitamins-Minerals (MULTIVITAMIN WITH MINERALS) tablet Take 1 tablet by mouth daily. Centrum men 50+     Omega-3 1000 MG CAPS Take 1,000 mg by mouth daily.     pioglitazone (ACTOS) 15 MG tablet Take 15 mg by mouth every morning.     potassium chloride SA (KLOR-CON M20) 20 MEQ tablet Take 1 tablet (20 mEq total) by mouth daily. 30 tablet 3   prednisoLONE acetate (PRED FORTE) 1 % ophthalmic suspension Place 1 drop into the right eye in the morning, at noon, in the evening, and at bedtime.     prednisoLONE acetate (PRED FORTE) 1 % ophthalmic suspension Place 1 drop into both eyes in the morning and at bedtime.     valACYclovir (VALTREX) 1000 MG tablet Take 1,000 mg by mouth at bedtime.     No current facility-administered medications for this visit.    Physical Exam: Vitals:   07/26/21 1054  BP: 118/64  Pulse: 71  SpO2: 92%  Weight: 260 lb 6.4 oz (118.1 kg)  Height: 5\' 10"  (1.778 m)    GEN- The  patient is well appearing, alert and oriented x 3 today.   Head- normocephalic, atraumatic Eyes-  Sclera clear, conjunctiva pink Ears- hearing intact Oropharynx- clear Lungs-  normal work of breathing Heart- iRRR GI- soft, NT, ND, + BS Extremities- no clubbing, cyanosis, + edema  Wt Readings from Last 3 Encounters:  07/26/21 260 lb 6.4 oz (118.1 kg)  07/12/21 256 lb 3.2 oz (116.2 kg)  07/04/21 254 lb 9.6 oz (115.5  kg)    EKG tracing ordered today is personally reviewed and shows afib  Assessment and Plan:  Persistent afib He has had ERAF post ablation I have advised cardioversion.  He is now willing to reconsider.  If he fails cardioversion, we will need to then consider AAD options.  Risks and benefits to [facility] discussed with the patient who wishes to proceed. Continue eliquis  2. Acute on chronic diastolic dysfunction I have advised that he take lasix 40mg  BID Sodium restriction advised Would screen for alleviate HF on return to AF clinic Pro bnp recent elevated  3. CAD No ischemic symptoms  4. HTN Stable No change required today  5. OSA Compliance with CPAP advised  Risks, benefits and potential toxicities for medications prescribed and/or refilled reviewed with patient today.   Very complicated patient.  A high level of decision making was required for the visit today.  He is at risk for worsening CHF and hospitalization.  Thompson Grayer MD, [facility] 07/26/2021 11:05 AM

## 2021-07-31 ENCOUNTER — Encounter (HOSPITAL_COMMUNITY): Payer: Self-pay | Admitting: Cardiovascular Disease

## 2021-08-02 NOTE — Progress Notes (Signed)
[facility] Note  08/04/2021     CHIEF COMPLAINT Patient presents for Retina Follow Up   HISTORY OF PRESENT ILLNESS: Lucas Dudley is a 80 y.o. male who presents to the clinic today for:  HPI     Retina Follow Up   Patient presents with  Diabetic Retinopathy.  In both eyes.  This started 4.5 weeks ago.  I, the attending physician,  performed the HPI with the patient and updated documentation appropriately.        Comments   Patient here for 4 1/2 weeks retina follow up for NPDR OU.Patient states vision not good. Has new glasses on order. No eye pain.       Last edited by Bernarda Caffey, MD on 08/04/2021  4:29 PM.    Pt can tell some difference following most recent cat sx w/Dr. Kathlen Mody.  Referring physician: Hortencia Pilar, MD [place],  [place] [address]  HISTORICAL INFORMATION:   Selected notes from the MEDICAL RECORD NUMBER Referred by Dr. Kathlen Mody for DEE   CURRENT MEDICATIONS: Current Outpatient Medications (Ophthalmic Drugs)  Medication Sig   Carboxymethylcellul-Glycerin (LUBRICATING EYE DROPS OP) Apply 1 drop to eye at bedtime.   dorzolamide-timolol (COSOPT) 22.3-6.8 MG/ML ophthalmic solution Place 1 drop into both eyes 2 (two) times daily.   prednisoLONE acetate (PRED FORTE) 1 % ophthalmic suspension Place 1 drop into the right eye in the morning, at noon, in the evening, and at bedtime.   prednisoLONE acetate (PRED FORTE) 1 % ophthalmic suspension Place 1 drop into both eyes in the morning and at bedtime.   No current facility-administered medications for this visit. (Ophthalmic Drugs)   Current Outpatient Medications (Other)  Medication Sig   amLODipine (NORVASC) 10 MG tablet Take 0.5 tablets (5 mg total) by mouth at bedtime. (Patient taking differently: Take 10 mg by mouth at bedtime.)   apixaban (ELIQUIS) 5 MG TABS tablet Take 1 tablet (5 mg total) by mouth 2 (two) times daily.   carvedilol (COREG) 6.25  MG tablet Takes 1 tablet by mouth in the AM and 1 tablet by mouth in the PM   diphenhydramine-acetaminophen (TYLENOL PM) 25-500 MG TABS tablet Take 2 tablets by mouth at bedtime.   Docusate Calcium (STOOL SOFTENER PO) Take 100 mg by mouth every evening.   furosemide (LASIX) 40 MG tablet Take 1 tablet (40 mg total) by mouth 2 (two) times daily.   gemfibrozil (LOPID) 600 MG tablet Take 600 mg by mouth at bedtime.    glimepiride (AMARYL) 2 MG tablet Take 2 mg by mouth 2 (two) times daily.    Insulin Glargine (BASAGLAR KWIKPEN) 100 UNIT/ML SOPN Inject 38 Units into the skin at bedtime.   lisinopril (ZESTRIL) 40 MG tablet Take 40 mg by mouth daily.   Melatonin 10 MG TABS Take 10 tablets by mouth at bedtime.   metFORMIN (GLUCOPHAGE-XR) 500 MG 24 hr tablet Take 1,000 mg by mouth every morning.   mineral oil liquid Take 15 mLs by mouth daily as needed for moderate constipation.   Multiple Vitamins-Minerals (MULTIVITAMIN WITH MINERALS) tablet Take 1 tablet by mouth daily. Centrum men 50+   Omega-3 1000 MG CAPS Take 1,000 mg by mouth daily.   pioglitazone (ACTOS) 15 MG tablet Take 15 mg by mouth every morning.   potassium chloride SA (KLOR-CON M20) 20 MEQ tablet Take 1 tablet (20 mEq total) by mouth daily.   valACYclovir (VALTREX) 1000 MG tablet Take 1,000 mg  by mouth at bedtime.   No current facility-administered medications for this visit. (Other)   REVIEW OF SYSTEMS: ROS   Positive for: Gastrointestinal, Endocrine, Cardiovascular, Eyes, Respiratory Negative for: Constitutional, Neurological, Skin, Genitourinary, Musculoskeletal, HENT, Psychiatric, Allergic/Imm, Heme/Lymph Last edited by Theodore Demark, COA on 08/04/2021  1:42 PM.     ALLERGIES Allergies  Allergen Reactions   Tape Rash and Other (See Comments)    Causes skin redness, Use paper tape only.   PAST MEDICAL HISTORY Past Medical History:  Diagnosis Date   Arthritis    Atrial fibrillation (HCC)    CAD (coronary artery  disease)    a. Cath 03/17/15 showing 100% ostial D1, 50% prox LAD to mid LAD, 40% RPDA stenosis. Med rx. // Myoview 01/2020: EF 31 diffuse perfusion defect without reversibility (suspect artifact); reviewed with Dr. Aggie Cosier study felt to be low risk   Cataract    Mixed form OD   Chronic diastolic CHF (congestive heart failure) (HCC)    Diabetic retinopathy ([place])    NPDR OU   Essential hypertension    Glaucoma    POAG OU   History of gout    Hyperlipidemia    Hypertensive retinopathy    OU   Kidney stones    Melanoma of neck (HCC)    NICM (nonischemic cardiomyopathy) ([place])    OSA on CPAP 2012   Prostate cancer ([place])    Type II diabetes mellitus ([place])    Past Surgical History:  Procedure Laterality Date   ABDOMINAL HERNIA REPAIR     w/mesh   ATRIAL FIBRILLATION ABLATION N/A 06/06/2021   Procedure: ATRIAL FIBRILLATION ABLATION;  Surgeon: Thompson Grayer, MD;  Location: [facility];  Service: Cardiovascular;  Laterality: N/A;   CARDIAC CATHETERIZATION N/A 03/17/2015   Procedure: Left Heart Cath and Coronary Angiography;  Surgeon: Lorretta Harp, MD;  Location: [facility];  Service: Cardiovascular;  Laterality: N/A;   carotid doppler  09/17/2008   rigt and left ICAs 0-49%;mildly  abnormal   CATARACT EXTRACTION Left 2020   Dr. Kathlen Mody   COLONOSCOPY N/A 05/16/2017   Procedure: COLONOSCOPY;  Surgeon: Rogene Houston, MD;  [facility];  Service: Endoscopy;  Laterality: N/A;  [place]  05/25/2009   EF 50-55%,LA mildly dilated, LV function normal   ELECTROPHYSIOLOGIC STUDY N/A 04/05/2015   Procedure: Cardioversion;  Surgeon: Sanda Klein, MD;  Location: [facility];  Service: Cardiovascular;  Laterality: N/A;   ELECTROPHYSIOLOGIC STUDY N/A 09/06/2015   Procedure: Atrial Fibrillation Ablation;  Surgeon: Thompson Grayer, MD;  Location: [facility];  Service: Cardiovascular;  Laterality: N/A;   ELECTROPHYSIOLOGIC STUDY N/A  07/12/2016   redo afib ablation by Dr Rayann Heman   EXCISIONAL HEMORRHOIDECTOMY     "inside and out"   EYE SURGERY Left 2020   Cat Sx - Dr. Kathlen Mody   FINE NEEDLE ASPIRATION Right    knee; "drew ~ 1 quart off"   [place] Right    "neck"   NM MYOCAR PERF WALL MOTION  02/21/2012   EF 61% ,EXERCISE 7 METS. exercise stopped due to wheezing and shortness of breathe   POLYPECTOMY  05/16/2017   Procedure: POLYPECTOMY;  Surgeon: Rogene Houston, MD;  [facility];  Service: Endoscopy;;  colon   PROSTATECTOMY     SHOULDER OPEN ROTATOR CUFF REPAIR Right X 2   TEE WITHOUT CARDIOVERSION  N/A 09/05/2015   Procedure: TRANSESOPHAGEAL ECHOCARDIOGRAM (TEE);  Surgeon: Sueanne Margarita, MD;  Location: [facility];  Service: Cardiovascular;  Laterality: N/A;   FAMILY HISTORY Family History  Problem Relation Age of Onset   Heart disease Mother    Lung cancer Mother    Heart attack Mother 66   Stroke Brother    Healthy Daughter    Diabetes Father    Heart disease Father    SOCIAL HISTORY Social History   Tobacco Use   Smoking status: Former    Packs/day: 2.00    Years: 27.00    Pack years: 54.00    Types: Cigarettes    Quit date: 10/31/1983    Years since quitting: 37.7   Smokeless tobacco: Never  Vaping Use   Vaping Use: Never used  Substance Use Topics   Alcohol use: No    Alcohol/week: 0.0 standard drinks    Comment: "used to drink; stopped ~ 2008"   Drug use: No       OPHTHALMIC EXAM: Base Eye Exam     Visual Acuity (Snellen - Linear)       Right Left   Dist cc 20/80 -1 20/50 -2   Dist ph cc 20/40 -2 NI    Correction: Glasses         Tonometry (Tonopen, 1:39 PM)       Right Left   Pressure 20 19         Pupils       Dark Light Shape React APD   Right 2 1 Round Minimal None   Left 2 1 Round Minimal None         Visual Fields (Counting fingers)       Left Right    Full Full          Extraocular Movement       Right Left    Full Full         Neuro/Psych     Oriented x3: Yes   Mood/Affect: Normal         Dilation     Both eyes: 1.0% Mydriacyl, 2.5% Phenylephrine @ 1:39 PM           Slit Lamp and Fundus Exam     Slit Lamp Exam       Right Left   Lids/Lashes Dermato, mild MGD Dermato, mild MGD   Conjunctiva/Sclera Temporal pinguecula Temporal pinguecula   Cornea EBMD, mild haze, 2+ PEE, mild Debris in tear film EBMD, mild haze, 1+ PEE, well healed temporal cataract wounds, arcus   Anterior Chamber Deep and clear; narrow temporal angle Deep and clear   Iris Round and dilated, mild anterior bowing, No NVI Round and moderately dilated to 5.66mm   Lens PCIOL in good position PCIOL, 1+ PCO - non central   Vitreous Synerisis Synerisis         Fundus Exam       Right Left   Disc Mild pallor, sharp, +cupping, thin inferior rim Mild pallor and sharp, +cupping, +PPA   C/D Ratio 0.7 0.6   Macula Flat, blunted foveal reflex, trace central cystic changes, RPE mottling and clumping, no heme Flat, blunted foveal reflex, clusters of fine MA nasal and superior to fovea--improved, +cystic changes -- slightly improved, focal exudates superior to fovea - improving   Vessels attenuated, Tortuous Attenuated, mild av crossing changes, mild tortuousity   Periphery Attached, rare MA Attached. No heme.  Refraction     Wearing Rx       Sphere Cylinder Axis Add   Right -0.75 +3.25 165 +2.50   Left -1.25 +1.25 015 +2.50           IMAGING AND PROCEDURES  Imaging and Procedures for 08/04/2021  OCT, Retina - OU - Both Eyes       Right Eye Quality was good. Central Foveal Thickness: 307. Progression has been stable. Findings include abnormal foveal contour, no SRF, vitreomacular adhesion , intraretinal fluid (Persistent cystic changes superior to fovea).   Left Eye Quality was good. Central Foveal Thickness: 328. Progression has improved.  Findings include abnormal foveal contour, intraretinal fluid, intraretinal hyper-reflective material (Mild interval improvement in IRF/cystic changes; partial PVD).   Notes *Images captured and stored on drive  Diagnosis / Impression:  Mild DME OU (OS > OD) OD: Persistent cystic changes superior to fovea OS: Mild interval improvement in IRF/cystic changes; partial PVD  Clinical management:  See below  Abbreviations: NFP - Normal foveal profile. CME - cystoid macular edema. PED - pigment epithelial detachment. IRF - intraretinal fluid. SRF - subretinal fluid. EZ - ellipsoid zone. ERM - epiretinal membrane. ORA - outer retinal atrophy. ORT - outer retinal tubulation. SRHM - subretinal hyper-reflective material. IRHM - intraretinal hyper-reflective material     Intravitreal Injection, Pharmacologic Agent - OS - Left Eye       Time Out 08/04/2021. 2:29 PM. Confirmed correct patient, procedure, site, and patient consented.   Anesthesia Topical anesthesia was used. Anesthetic medications included Lidocaine 2%, Proparacaine 0.5%.   Procedure Preparation included 5% betadine to ocular surface, eyelid speculum. A (32g) needle was used.   Injection: 2 mg aflibercept 2 MG/0.05ML   Route: Intravitreal, Site: Left Eye   NDC: [id number], Lot: [id number], Expiration date: 06/13/2022, Waste: 0.05 mL   Post-op Post injection exam found visual acuity of at least counting fingers. The patient tolerated the procedure well. There were no complications. The patient received written and verbal post procedure care education. Post injection medications were not given.            ASSESSMENT/PLAN:   ICD-10-CM   1. 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, associated with type 2 diabetes mellitus (HCC)  I33.8250 Intravitreal Injection, Pharmacologic Agent - OS - Left Eye    aflibercept (EYLEA) SOLN 2 mg    2. Retinal edema  H35.81 OCT, Retina - OU - Both  Eyes    3. Essential hypertension  I10     4. Hypertensive retinopathy of both eyes  H35.033     5. Pseudophakia of both eyes  Z96.1     6. 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 History of herpes zoster of eye  Z86.19     8. Primary open angle glaucoma of both eyes, unspecified glaucoma stage  H40.1130      1,2. 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 Single focal MA superior to fovea.                                  OS: Perifoveal Mas w/late leakage - OCT shows diabetic macular edema OU (OS>OD) - s/p IVA OS # 1(07.26.21), #2 (08.24.21), #3 (09.21.21), #4 (10.22.21) -- IVA resistance - s/p IVE OS #1 (11.22.21), #2 (12.21.21), #3 (01.20.22), #4 (02.18.22), #5 (03.18.22), #6 (04.15.22), #  7 (05.10.22) -- sample, #8 (06.08.22), #9 (07.12.22), #10 (08.12.22), #11 (09.16.22) - BCVA OS stable at 20/50  - OCT shows  OD: Persistent cystic changes; OS: Mild interval improvement in IRF/cystic changes; partial PVD - recommend IVE OS #12 today, 10.21.22 - pt wishes to proceed with injection - RBA of procedure discussed, questions answered - IVA informed consent obtained and signed, 07.26.21 (OS) - IVE informed consent obtained and signed, 11.22.21 (OS) - see procedure note - Good Days approved Eylea until 10/14/2021. - [employer] approved Eylea until 09/04/2021 - f/u in 4-5 wks -- DFE/OCT, possible injection  3,4. Hypertensive retinopathy OU - discussed importance of tight BP control - monitor   5. Pseudophakia OU  - s/p CE/IOL OS (2020, Dr. Kathlen Mody)             - s/p CE/IOL OD (2022, Dr. Kathlen Mody)  - IOLs in good position, doing well  - monitor   6. 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 Susa Simmonds in August 2022  - OS to undergo SuperK at some point w/ [place]  7. History of herpes zoster/iritis OS  - on valrex 1g daily---maintenance   8. POAG OU  - under the expert  management of Dr. Kathlen Mody  - IOP 20, 77 today  - currently on cosopt BID OU  Ophthalmic Meds Ordered this visit:  Meds ordered this encounter  Medications   aflibercept (EYLEA) SOLN 2 mg     Return for 4-5 wk f/u for mild NPDROU w/DFE/OCT/likely inj. OS.  There are no Patient Instructions on file for this visit.  This document serves as a record of services personally performed by Gardiner Sleeper, MD, PhD. It was created on their behalf by Leonie Douglas, an ophthalmic technician. The creation of this record is the provider's dictation and/or activities during the visit.    Electronically signed by: Leonie Douglas COA, 08/04/21  4:30 PM  Gardiner Sleeper, M.D., Ph.D. Diseases & Surgery of the Retina and [place] 08/04/2021  I have reviewed the above documentation for accuracy and completeness, and I agree with the above. Gardiner Sleeper, M.D., Ph.D. 08/04/21 4:31 PM  Abbreviations: M myopia (nearsighted); A astigmatism; H hyperopia (farsighted); P presbyopia; Mrx spectacle prescription;  CTL contact lenses; OD right eye; OS left eye; OU both eyes  XT exotropia; ET esotropia; PEK punctate epithelial keratitis; PEE punctate epithelial erosions; DES dry eye syndrome; MGD meibomian gland dysfunction; ATs artificial tears; PFAT's preservative free artificial tears; Harvey nuclear sclerotic cataract; PSC posterior subcapsular cataract; ERM epi-retinal membrane; PVD posterior vitreous detachment; RD retinal detachment; DM diabetes mellitus; DR diabetic retinopathy; NPDR non-proliferative diabetic retinopathy; PDR proliferative diabetic retinopathy; CSME clinically significant macular edema; DME diabetic macular edema; dbh dot blot hemorrhages; CWS cotton wool spot; POAG primary open angle glaucoma; C/D cup-to-disc ratio; HVF humphrey visual field; GVF goldmann visual field; OCT optical coherence tomography; IOP intraocular pressure; BRVO Branch retinal vein occlusion;  CRVO central retinal vein occlusion; CRAO central retinal artery occlusion; BRAO branch retinal artery occlusion; RT retinal tear; SB scleral buckle; PPV pars plana vitrectomy; VH Vitreous hemorrhage; PRP panretinal laser photocoagulation; IVK intravitreal kenalog; VMT vitreomacular traction; MH Macular hole;  NVD neovascularization of the disc; NVE neovascularization elsewhere; AREDS age related eye disease study; ARMD age related macular degeneration; POAG primary open angle glaucoma; EBMD epithelial/anterior basement membrane dystrophy; ACIOL anterior chamber intraocular lens; IOL intraocular lens; PCIOL  posterior chamber intraocular lens; Phaco/IOL phacoemulsification with intraocular lens placement; PRK photorefractive keratectomy; LASIK laser assisted in situ keratomileusis; HTN hypertension; DM diabetes mellitus; COPD chronic obstructive pulmonary disease 

## 2021-08-04 ENCOUNTER — Encounter (INDEPENDENT_AMBULATORY_CARE_PROVIDER_SITE_OTHER): Payer: Self-pay | Admitting: Ophthalmology

## 2021-08-04 ENCOUNTER — Ambulatory Visit (INDEPENDENT_AMBULATORY_CARE_PROVIDER_SITE_OTHER): Payer: Medicare Other | Admitting: Ophthalmology

## 2021-08-04 ENCOUNTER — Other Ambulatory Visit: Payer: Self-pay

## 2021-08-04 DIAGNOSIS — Z961 Presence of intraocular lens: Secondary | ICD-10-CM | POA: Diagnosis not present

## 2021-08-04 DIAGNOSIS — I10 Essential (primary) hypertension: Secondary | ICD-10-CM | POA: Diagnosis not present

## 2021-08-04 DIAGNOSIS — E113213 Type 2 diabetes mellitus with mild nonproliferative diabetic retinopathy with macular edema, bilateral: Secondary | ICD-10-CM | POA: Diagnosis not present

## 2021-08-04 DIAGNOSIS — H18523 Epithelial (juvenile) corneal dystrophy, bilateral: Secondary | ICD-10-CM

## 2021-08-04 DIAGNOSIS — Z8619 Personal history of other infectious and parasitic diseases: Secondary | ICD-10-CM

## 2021-08-04 DIAGNOSIS — H3581 Retinal edema: Secondary | ICD-10-CM

## 2021-08-04 DIAGNOSIS — H25811 Combined forms of age-related cataract, right eye: Secondary | ICD-10-CM

## 2021-08-04 DIAGNOSIS — H35033 Hypertensive retinopathy, bilateral: Secondary | ICD-10-CM

## 2021-08-04 DIAGNOSIS — H401130 Primary open-angle glaucoma, bilateral, stage unspecified: Secondary | ICD-10-CM

## 2021-08-04 MED ORDER — AFLIBERCEPT 2MG/0.05ML IZ SOLN FOR KALEIDOSCOPE
2.0000 mg | INTRAVITREAL | Status: AC | PRN
  Administered 2021-08-04: 2 mg via INTRAVITREAL

## 2021-08-09 ENCOUNTER — Ambulatory Visit (HOSPITAL_COMMUNITY): Payer: Medicare Other | Admitting: Anesthesiology

## 2021-08-09 ENCOUNTER — Encounter (HOSPITAL_COMMUNITY): Payer: Self-pay | Admitting: Cardiovascular Disease

## 2021-08-09 ENCOUNTER — Telehealth (HOSPITAL_COMMUNITY): Payer: Self-pay | Admitting: *Deleted

## 2021-08-09 ENCOUNTER — Emergency Department (HOSPITAL_COMMUNITY): Payer: Medicare Other

## 2021-08-09 ENCOUNTER — Encounter (HOSPITAL_COMMUNITY)
Admission: RE | Disposition: A | Discharge status: Home / Self Care | Payer: Self-pay | Source: Home / Self Care | Attending: Cardiovascular Disease

## 2021-08-09 ENCOUNTER — Ambulatory Visit (HOSPITAL_BASED_OUTPATIENT_CLINIC_OR_DEPARTMENT_OTHER)
Admission: RE | Admit: 2021-08-09 | Discharge: 2021-08-09 | Disposition: A | Discharge status: Home / Self Care | Payer: Medicare Other | Source: Home / Self Care | Attending: Cardiovascular Disease | Admitting: Cardiovascular Disease

## 2021-08-09 ENCOUNTER — Other Ambulatory Visit: Payer: Self-pay

## 2021-08-09 ENCOUNTER — Inpatient Hospital Stay (HOSPITAL_COMMUNITY)
Admission: EM | Admit: 2021-08-09 | Discharge: 2021-08-15 | DRG: 291 | Disposition: A | Discharge status: Home Health | Payer: Medicare Other | Attending: Internal Medicine | Admitting: Internal Medicine

## 2021-08-09 DIAGNOSIS — I480 Paroxysmal atrial fibrillation: Secondary | ICD-10-CM

## 2021-08-09 DIAGNOSIS — R109 Unspecified abdominal pain: Secondary | ICD-10-CM

## 2021-08-09 DIAGNOSIS — I428 Other cardiomyopathies: Secondary | ICD-10-CM | POA: Diagnosis present

## 2021-08-09 DIAGNOSIS — E1139 Type 2 diabetes mellitus with other diabetic ophthalmic complication: Secondary | ICD-10-CM | POA: Insufficient documentation

## 2021-08-09 DIAGNOSIS — H42 Glaucoma in diseases classified elsewhere: Secondary | ICD-10-CM | POA: Insufficient documentation

## 2021-08-09 DIAGNOSIS — N529 Male erectile dysfunction, unspecified: Secondary | ICD-10-CM

## 2021-08-09 DIAGNOSIS — I509 Heart failure, unspecified: Secondary | ICD-10-CM | POA: Diagnosis not present

## 2021-08-09 DIAGNOSIS — Z8546 Personal history of malignant neoplasm of prostate: Secondary | ICD-10-CM | POA: Insufficient documentation

## 2021-08-09 DIAGNOSIS — E119 Type 2 diabetes mellitus without complications: Secondary | ICD-10-CM

## 2021-08-09 DIAGNOSIS — Z794 Long term (current) use of insulin: Secondary | ICD-10-CM | POA: Insufficient documentation

## 2021-08-09 DIAGNOSIS — Z7984 Long term (current) use of oral hypoglycemic drugs: Secondary | ICD-10-CM

## 2021-08-09 DIAGNOSIS — R9439 Abnormal result of other cardiovascular function study: Secondary | ICD-10-CM

## 2021-08-09 DIAGNOSIS — I2510 Atherosclerotic heart disease of native coronary artery without angina pectoris: Secondary | ICD-10-CM

## 2021-08-09 DIAGNOSIS — Z91048 Other nonmedicinal substance allergy status: Secondary | ICD-10-CM

## 2021-08-09 DIAGNOSIS — I519 Heart disease, unspecified: Secondary | ICD-10-CM

## 2021-08-09 DIAGNOSIS — I10 Essential (primary) hypertension: Secondary | ICD-10-CM | POA: Diagnosis present

## 2021-08-09 DIAGNOSIS — I5020 Unspecified systolic (congestive) heart failure: Secondary | ICD-10-CM

## 2021-08-09 DIAGNOSIS — Z79899 Other long term (current) drug therapy: Secondary | ICD-10-CM | POA: Insufficient documentation

## 2021-08-09 DIAGNOSIS — G4733 Obstructive sleep apnea (adult) (pediatric): Secondary | ICD-10-CM

## 2021-08-09 DIAGNOSIS — C61 Malignant neoplasm of prostate: Secondary | ICD-10-CM

## 2021-08-09 DIAGNOSIS — E7800 Pure hypercholesterolemia, unspecified: Secondary | ICD-10-CM

## 2021-08-09 DIAGNOSIS — J9601 Acute respiratory failure with hypoxia: Secondary | ICD-10-CM | POA: Diagnosis not present

## 2021-08-09 DIAGNOSIS — Z7901 Long term (current) use of anticoagulants: Secondary | ICD-10-CM

## 2021-08-09 DIAGNOSIS — Z85820 Personal history of malignant melanoma of skin: Secondary | ICD-10-CM

## 2021-08-09 DIAGNOSIS — I110 Hypertensive heart disease with heart failure: Secondary | ICD-10-CM | POA: Diagnosis not present

## 2021-08-09 DIAGNOSIS — U071 COVID-19: Secondary | ICD-10-CM | POA: Diagnosis present

## 2021-08-09 DIAGNOSIS — Z86010 Personal history of colonic polyps: Secondary | ICD-10-CM

## 2021-08-09 DIAGNOSIS — E1169 Type 2 diabetes mellitus with other specified complication: Secondary | ICD-10-CM

## 2021-08-09 DIAGNOSIS — Z800 Family history of malignant neoplasm of digestive organs: Secondary | ICD-10-CM

## 2021-08-09 DIAGNOSIS — E11319 Type 2 diabetes mellitus with unspecified diabetic retinopathy without macular edema: Secondary | ICD-10-CM | POA: Insufficient documentation

## 2021-08-09 DIAGNOSIS — N200 Calculus of kidney: Secondary | ICD-10-CM

## 2021-08-09 DIAGNOSIS — M109 Gout, unspecified: Secondary | ICD-10-CM | POA: Diagnosis present

## 2021-08-09 DIAGNOSIS — K921 Melena: Secondary | ICD-10-CM

## 2021-08-09 DIAGNOSIS — Z833 Family history of diabetes mellitus: Secondary | ICD-10-CM

## 2021-08-09 DIAGNOSIS — I5033 Acute on chronic diastolic (congestive) heart failure: Secondary | ICD-10-CM | POA: Insufficient documentation

## 2021-08-09 DIAGNOSIS — Z87891 Personal history of nicotine dependence: Secondary | ICD-10-CM

## 2021-08-09 DIAGNOSIS — H401130 Primary open-angle glaucoma, bilateral, stage unspecified: Secondary | ICD-10-CM | POA: Diagnosis present

## 2021-08-09 DIAGNOSIS — E1165 Type 2 diabetes mellitus with hyperglycemia: Secondary | ICD-10-CM | POA: Diagnosis present

## 2021-08-09 DIAGNOSIS — E669 Obesity, unspecified: Secondary | ICD-10-CM | POA: Diagnosis present

## 2021-08-09 DIAGNOSIS — I5043 Acute on chronic combined systolic (congestive) and diastolic (congestive) heart failure: Secondary | ICD-10-CM | POA: Diagnosis present

## 2021-08-09 DIAGNOSIS — I4819 Other persistent atrial fibrillation: Secondary | ICD-10-CM | POA: Insufficient documentation

## 2021-08-09 DIAGNOSIS — E785 Hyperlipidemia, unspecified: Secondary | ICD-10-CM | POA: Diagnosis present

## 2021-08-09 DIAGNOSIS — E876 Hypokalemia: Secondary | ICD-10-CM | POA: Diagnosis present

## 2021-08-09 DIAGNOSIS — Z8249 Family history of ischemic heart disease and other diseases of the circulatory system: Secondary | ICD-10-CM

## 2021-08-09 DIAGNOSIS — R0602 Shortness of breath: Secondary | ICD-10-CM

## 2021-08-09 DIAGNOSIS — E113293 Type 2 diabetes mellitus with mild nonproliferative diabetic retinopathy without macular edema, bilateral: Secondary | ICD-10-CM | POA: Diagnosis present

## 2021-08-09 DIAGNOSIS — I4891 Unspecified atrial fibrillation: Secondary | ICD-10-CM | POA: Diagnosis present

## 2021-08-09 DIAGNOSIS — I420 Dilated cardiomyopathy: Secondary | ICD-10-CM

## 2021-08-09 HISTORY — PX: CARDIOVERSION: SHX1299

## 2021-08-09 LAB — COMPREHENSIVE METABOLIC PANEL
ALT: 16 U/L (ref 0–44)
AST: 20 U/L (ref 15–41)
Albumin: 4.3 g/dL (ref 3.5–5.0)
Alkaline Phosphatase: 60 U/L (ref 38–126)
Anion gap: 10 (ref 5–15)
BUN: 23 mg/dL (ref 8–23)
CO2: 28 mmol/L (ref 22–32)
Calcium: 9 mg/dL (ref 8.9–10.3)
Chloride: 98 mmol/L (ref 98–111)
Creatinine, Ser: 1.09 mg/dL (ref 0.61–1.24)
GFR, Estimated: >60 mL/min (ref >60)
Glucose, Bld: 152 mg/dL — ABNORMAL HIGH (ref 70–99)
Potassium: 3.6 mmol/L (ref 3.5–5.1)
Sodium: 136 mmol/L (ref 135–145)
Total Bilirubin: 1.3 mg/dL — ABNORMAL HIGH (ref 0.3–1.2)
Total Protein: 8.6 g/dL — ABNORMAL HIGH (ref 6.5–8.1)

## 2021-08-09 LAB — CBC WITH DIFFERENTIAL/PLATELET
Abs Immature Granulocytes: 0.02 10*3/uL (ref 0.00–0.07)
Basophils Absolute: 0 10*3/uL (ref 0.0–0.1)
Basophils Relative: 0 %
Eosinophils Absolute: 0.1 10*3/uL (ref 0.0–0.5)
Eosinophils Relative: 1 %
HCT: 40.9 % (ref 39.0–52.0)
Hemoglobin: 12.8 g/dL — ABNORMAL LOW (ref 13.0–17.0)
Immature Granulocytes: 0 %
Lymphocytes Relative: 4 %
Lymphs Abs: 0.3 10*3/uL — ABNORMAL LOW (ref 0.7–4.0)
MCH: 29 pg (ref 26.0–34.0)
MCHC: 31.3 g/dL (ref 30.0–36.0)
MCV: 92.5 fL (ref 80.0–100.0)
Monocytes Absolute: 0.6 10*3/uL (ref 0.1–1.0)
Monocytes Relative: 8 %
Neutro Abs: 6.5 10*3/uL (ref 1.7–7.7)
Neutrophils Relative %: 87 %
Platelets: 179 10*3/uL (ref 150–400)
RBC: 4.42 MIL/uL (ref 4.22–5.81)
RDW: 17.2 % — ABNORMAL HIGH (ref 11.5–15.5)
WBC: 7.5 10*3/uL (ref 4.0–10.5)
nRBC: 0 % (ref 0.0–0.2)

## 2021-08-09 LAB — POCT I-STAT, CHEM 8
BUN: 21 mg/dL (ref 8–23)
Calcium, Ion: 1.09 mmol/L — ABNORMAL LOW (ref 1.15–1.40)
Chloride: 102 mmol/L (ref 98–111)
Creatinine, Ser: 1.1 mg/dL (ref 0.61–1.24)
Glucose, Bld: 97 mg/dL (ref 70–99)
HCT: 37 % — ABNORMAL LOW (ref 39.0–52.0)
Hemoglobin: 12.6 g/dL — ABNORMAL LOW (ref 13.0–17.0)
Potassium: 3.9 mmol/L (ref 3.5–5.1)
Sodium: 141 mmol/L (ref 135–145)
TCO2: 28 mmol/L (ref 22–32)

## 2021-08-09 LAB — TROPONIN I (HIGH SENSITIVITY)
Troponin I (High Sensitivity): 16 ng/L (ref <18)
Troponin I (High Sensitivity): 18 ng/L — ABNORMAL HIGH (ref <18)

## 2021-08-09 LAB — BRAIN NATRIURETIC PEPTIDE: B Natriuretic Peptide: 240 pg/mL — ABNORMAL HIGH (ref 0.0–100.0)

## 2021-08-09 LAB — RESP PANEL BY RT-PCR (FLU A&B, COVID) ARPGX2
Influenza A by PCR: NEGATIVE
Influenza B by PCR: NEGATIVE
SARS Coronavirus 2 by RT PCR: POSITIVE — AB

## 2021-08-09 LAB — CBG MONITORING, ED: Glucose-Capillary: 150 mg/dL — ABNORMAL HIGH (ref 70–99)

## 2021-08-09 SURGERY — CARDIOVERSION
Anesthesia: General

## 2021-08-09 MED ORDER — PROPOFOL 10 MG/ML IV BOLUS
INTRAVENOUS | Status: DC | PRN
  Administered 2021-08-09: 70 mg via INTRAVENOUS

## 2021-08-09 MED ORDER — CARVEDILOL 3.125 MG PO TABS
6.2500 mg | ORAL_TABLET | Freq: Two times a day (BID) | ORAL | Status: DC
  Administered 2021-08-10 – 2021-08-15 (×11): 6.25 mg via ORAL
  Filled 2021-08-09 (×11): qty 2

## 2021-08-09 MED ORDER — INSULIN ASPART 100 UNIT/ML IJ SOLN
0.0000 [IU] | Freq: Every day | INTRAMUSCULAR | Status: DC
  Administered 2021-08-11: 3 [IU] via SUBCUTANEOUS

## 2021-08-09 MED ORDER — AMLODIPINE BESYLATE 5 MG PO TABS: 10.0000 mg | ORAL_TABLET | Freq: Every day | ORAL | Status: DC

## 2021-08-09 MED ORDER — MOLNUPIRAVIR EUA 200MG CAPSULE
4.0000 | ORAL_CAPSULE | Freq: Two times a day (BID) | ORAL | Status: DC
  Administered 2021-08-09: 800 mg via ORAL
  Filled 2021-08-09 (×2): qty 4

## 2021-08-09 MED ORDER — CARVEDILOL 3.125 MG PO TABS: 6.2500 mg | ORAL_TABLET | Freq: Two times a day (BID) | ORAL | Status: DC

## 2021-08-09 MED ORDER — LISINOPRIL 10 MG PO TABS
40.0000 mg | ORAL_TABLET | Freq: Every day | ORAL | Status: DC
  Administered 2021-08-10 – 2021-08-15 (×6): 40 mg via ORAL
  Filled 2021-08-09 (×6): qty 4

## 2021-08-09 MED ORDER — INSULIN DETEMIR 100 UNIT/ML ~~LOC~~ SOLN
25.0000 [IU] | Freq: Every day | SUBCUTANEOUS | Status: DC
  Administered 2021-08-10 – 2021-08-11 (×2): 25 [IU] via SUBCUTANEOUS
  Filled 2021-08-09 (×5): qty 0.25

## 2021-08-09 MED ORDER — SODIUM CHLORIDE 0.9 % IV SOLN: INTRAVENOUS | Status: DC | PRN

## 2021-08-09 MED ORDER — APIXABAN 5 MG PO TABS
5.0000 mg | ORAL_TABLET | Freq: Two times a day (BID) | ORAL | Status: DC
  Administered 2021-08-09 – 2021-08-15 (×12): 5 mg via ORAL
  Filled 2021-08-09 (×12): qty 1

## 2021-08-09 MED ORDER — DIPHENHYDRAMINE HCL 25 MG PO CAPS: 50.0000 mg | ORAL_CAPSULE | Freq: Every evening | ORAL | Status: DC | PRN

## 2021-08-09 MED ORDER — FUROSEMIDE 10 MG/ML IJ SOLN
40.0000 mg | Freq: Once | INTRAMUSCULAR | Status: AC
  Administered 2021-08-09: 40 mg via INTRAVENOUS
  Filled 2021-08-09: qty 4

## 2021-08-09 MED ORDER — ACETAMINOPHEN 325 MG PO TABS
650.0000 mg | ORAL_TABLET | Freq: Four times a day (QID) | ORAL | Status: DC | PRN
  Administered 2021-08-10: 650 mg via ORAL
  Filled 2021-08-09: qty 2

## 2021-08-09 MED ORDER — NITROGLYCERIN 2 % TD OINT
1.0000 [in_us] | TOPICAL_OINTMENT | Freq: Once | TRANSDERMAL | Status: AC
  Administered 2021-08-09: 1 [in_us] via TOPICAL
  Filled 2021-08-09: qty 1

## 2021-08-09 MED ORDER — OXYCODONE HCL 5 MG PO TABS: 5.0000 mg | ORAL_TABLET | ORAL | Status: DC | PRN

## 2021-08-09 MED ORDER — MELATONIN 3 MG PO TABS
6.0000 mg | ORAL_TABLET | Freq: Every day | ORAL | Status: DC
  Administered 2021-08-09 – 2021-08-14 (×6): 6 mg via ORAL
  Filled 2021-08-09 (×8): qty 2

## 2021-08-09 MED ORDER — FUROSEMIDE 10 MG/ML IJ SOLN
40.0000 mg | Freq: Two times a day (BID) | INTRAMUSCULAR | Status: DC
  Administered 2021-08-09 – 2021-08-11 (×4): 40 mg via INTRAVENOUS
  Filled 2021-08-09 (×5): qty 4

## 2021-08-09 MED ORDER — NIRMATRELVIR/RITONAVIR (PAXLOVID)TABLET
3.0000 | ORAL_TABLET | Freq: Two times a day (BID) | ORAL | Status: DC
Start: 2021-08-09 — End: 2021-08-09

## 2021-08-09 MED ORDER — ONDANSETRON HCL 4 MG PO TABS: 4.0000 mg | ORAL_TABLET | Freq: Four times a day (QID) | ORAL | Status: DC | PRN

## 2021-08-09 MED ORDER — ONDANSETRON HCL 4 MG/2ML IJ SOLN: 4.0000 mg | Freq: Four times a day (QID) | INTRAMUSCULAR | Status: DC | PRN

## 2021-08-09 MED ORDER — LIDOCAINE 2% (20 MG/ML) 5 ML SYRINGE
INTRAMUSCULAR | Status: DC | PRN
Start: 2021-08-09 — End: 2021-08-09
  Administered 2021-08-09: 100 mg via INTRAVENOUS

## 2021-08-09 MED ORDER — AMLODIPINE BESYLATE 5 MG PO TABS
10.0000 mg | ORAL_TABLET | Freq: Every day | ORAL | Status: DC
  Administered 2021-08-10 – 2021-08-14 (×5): 10 mg via ORAL
  Filled 2021-08-09 (×5): qty 2

## 2021-08-09 MED ORDER — INSULIN ASPART 100 UNIT/ML IJ SOLN
0.0000 [IU] | Freq: Three times a day (TID) | INTRAMUSCULAR | Status: DC
  Administered 2021-08-10 – 2021-08-11 (×3): 3 [IU] via SUBCUTANEOUS
  Administered 2021-08-11: 5 [IU] via SUBCUTANEOUS
  Administered 2021-08-12: 11 [IU] via SUBCUTANEOUS
  Administered 2021-08-12: 5 [IU] via SUBCUTANEOUS
  Administered 2021-08-12: 11 [IU] via SUBCUTANEOUS
  Filled 2021-08-09 (×2): qty 1

## 2021-08-09 MED ORDER — ACETAMINOPHEN 650 MG RE SUPP: 650.0000 mg | Freq: Four times a day (QID) | RECTAL | Status: DC | PRN

## 2021-08-09 NOTE — CV Procedure (Signed)
Electrical Cardioversion Procedure Note Lucas Dudley [id number] 25-Jan-1941  Procedure: Electrical Cardioversion Indications:  Atrial Fibrillation  Procedure Details Consent: Risks of procedure as well as the alternatives and risks of each were explained to the (patient/caregiver).  Consent for procedure obtained. Time Out: Verified patient identification, verified procedure, site/side was marked, verified correct patient position, special equipment/implants available, medications/allergies/relevent history reviewed, required imaging and test results available.  Performed  Patient placed on cardiac monitor, pulse oximetry, supplemental oxygen as necessary.  Sedation given:  propofol Pacer pads placed anterior and posterior chest.  Cardioverted 1 time(s).  Cardioverted at [place].  Evaluation Findings: Post procedure EKG shows: NSR Complications: None Patient did tolerate procedure well.   [name], MD 08/09/2021, 10:38 AM

## 2021-08-09 NOTE — ED Provider Notes (Signed)
[facility] EMERGENCY DEPARTMENT Provider Note   CSN: [id number] Arrival date & time: 08/09/21  1637     History Chief Complaint  Patient presents with   Shortness of Breath    Lucas Dudley is a 80 y.o. male.  Patient has cardiomyopathy and congestive heart failure.  He was in atrial for but had cardioversion today and is in normal sinus.  He has been having lots of problems with swelling in his legs and shortness of breath that became worse this afternoon  The history is provided by the patient and medical records. No language interpreter was used.  Shortness of Breath Severity:  Moderate Onset quality:  Gradual Timing:  Constant Progression:  Worsening Chronicity:  Recurrent Context: activity   Relieved by:  Nothing Worsened by:  Nothing Ineffective treatments:  None tried Associated symptoms: no abdominal pain, no chest pain, no cough, no headaches and no rash       Past Medical History:  Diagnosis Date   Arthritis    Atrial fibrillation (HCC)    CAD (coronary artery disease)    a. Cath 03/17/15 showing 100% ostial D1, 50% prox LAD to mid LAD, 40% RPDA stenosis. Med rx. // Myoview 01/2020: EF 31 diffuse perfusion defect without reversibility (suspect artifact); reviewed with Dr. 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[place])    OSA on CPAP 2012   Prostate cancer ([place])    Type II diabetes mellitus ([place])     Patient Active Problem List   Diagnosis Date Noted   CHF exacerbation ([place]) 08/09/2021   History of colonic polyps 01/22/2017   Family hx of colorectal cancer 01/22/2017   A-fib ([place]) 07/12/2016   Acute on chronic combined systolic  and diastolic CHF (congestive heart failure) ([place]) 05/17/2016   CAD in native artery 05/17/2016   PAF (paroxysmal atrial fibrillation) ([place]) 75/07/2584   Chronic systolic dysfunction of left ventricle 07/02/2015   Cardiomyopathy, dilated, nonischemic ([place]) 05/04/2015   Severe obstructive sleep apnea 04/25/2015   Persistent atrial fibrillation ([place]) 04/04/2015   Diabetes mellitus type 2, noninsulin dependent (HCC)    Chronic anticoagulation    Abnormal stress test    Shortness of breath 01/18/2015   Paroxysmal atrial fibrillation ([place]) 07/06/2014   Benign essential HTN 06/14/2014   Calculus of kidney 06/14/2014   Diabetes mellitus with no complication ([place]) 27/78/2423   Right flank pain 06/14/2014   Blood in the urine 06/14/2014   CA of prostate ([place]) 06/14/2014   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/14/2014   Hypercholesterolemia without hypertriglyceridemia 06/14/2014   Hematochezia 10/30/2013   Essential hypertension 07/10/2013   Hyperlipidemia 07/10/2013    Past Surgical History:  Procedure Laterality Date   ABDOMINAL HERNIA REPAIR     w/mesh   ATRIAL FIBRILLATION ABLATION N/A 06/06/2021   Procedure: ATRIAL FIBRILLATION ABLATION;  Surgeon: Thompson Grayer, MD;  Location: [place] CV LAB;  Service: Cardiovascular;  Laterality: N/A;   CARDIAC CATHETERIZATION N/A 03/17/2015   Procedure: Left Heart Cath and Coronary Angiography;  Surgeon: Lorretta Harp, MD;  Location: [place] [facility];  Service: Cardiovascular;  Laterality: N/A;   carotid doppler  09/17/2008   rigt and left ICAs 0-49%;mildly  abnormal   CATARACT EXTRACTION Left 2020   Dr. Kathlen Mody   COLONOSCOPY N/A 05/16/2017   Procedure: COLONOSCOPY;  Surgeon: Rogene Houston, MD;  [facility];  Service: Endoscopy;  Laterality: N/A;  [place]  05/25/2009   EF 50-55%,LA mildly dilated, LV function normal   ELECTROPHYSIOLOGIC STUDY N/A 04/05/2015   Procedure: Cardioversion;  Surgeon:  Sanda Klein, MD;  Location: [place] [facility];  Service: Cardiovascular;  Laterality: N/A;   ELECTROPHYSIOLOGIC STUDY N/A 09/06/2015   Procedure: Atrial Fibrillation Ablation;  Surgeon: Thompson Grayer, MD;  Location: [place] [facility];  Service: Cardiovascular;  Laterality: N/A;   ELECTROPHYSIOLOGIC STUDY N/A 07/12/2016   redo afib ablation by Dr Rayann Heman   EXCISIONAL HEMORRHOIDECTOMY     "inside and out"   EYE SURGERY Left 2020   Cat Sx - Dr. Kathlen Mody   FINE NEEDLE ASPIRATION Right    knee; "drew ~ 1 quart off"   [place] Right    "neck"   NM MYOCAR PERF WALL MOTION  02/21/2012   EF 61% ,EXERCISE 7 METS. exercise stopped due to wheezing and shortness of breathe   POLYPECTOMY  05/16/2017   Procedure: POLYPECTOMY;  Surgeon: Rogene Houston, MD;  [facility];  Service: Endoscopy;;  colon   PROSTATECTOMY     SHOULDER OPEN ROTATOR CUFF REPAIR Right X 2   TEE WITHOUT CARDIOVERSION N/A 09/05/2015   Procedure: TRANSESOPHAGEAL ECHOCARDIOGRAM (TEE);  Surgeon: Sueanne Margarita, MD;  Location: [facility] ENDOSCOPY;  Service: Cardiovascular;  Laterality: N/A;       Family History  Problem Relation Age of Onset   Heart disease Mother    Lung cancer Mother    Heart attack Mother 65   Stroke Brother    Healthy Daughter    Diabetes Father    Heart disease Father     Social History   Tobacco Use   Smoking status: Former    Packs/day: 2.00    Years: 27.00    Pack years: 54.00    Types: Cigarettes    Quit date: 10/31/1983    Years since quitting: 37.8   Smokeless tobacco: Never  Vaping Use   Vaping Use: Never used  Substance Use Topics   Alcohol use: No    Alcohol/week: 0.0 standard drinks    Comment: "used to drink; stopped ~ 2008"   Drug use: No    Home Medications Prior to Admission medications   Medication Sig Start Date End Date Taking? Authorizing Provider  amLODipine (NORVASC) 10 MG tablet Take 0.5 tablets (5  mg total) by mouth at bedtime. Patient taking differently: Take 10 mg by mouth at bedtime. 07/04/21   Sherran Needs, NP  apixaban (ELIQUIS) 5 MG TABS tablet Take 1 tablet (5 mg total) by mouth 2 (two) times daily. 04/21/21   Allred, Jeneen Rinks, MD  carvedilol (COREG) 6.25 MG tablet Takes 1 tablet by mouth in the AM and 1 tablet by mouth in the PM 07/04/21   Sherran Needs, NP  COMBIGAN 0.2-0.5 % ophthalmic solution Apply 1 drop to eye 2 (two) times daily. 08/03/21   [provider]  diphenhydramine-acetaminophen (TYLENOL PM) 25-500 MG TABS tablet Take 2 tablets by mouth at bedtime.    [provider]  Docusate Calcium (STOOL SOFTENER PO) Take 100 mg by mouth every evening.    [provider]  dorzolamide-timolol (COSOPT) 22.3-6.8 MG/ML ophthalmic solution Place 1 drop into both eyes 2 (two) times daily. Patient taking differently: Place 1 drop into both eyes at bedtime. 12/02/20   Bernarda Caffey, MD  furosemide (LASIX) 40 MG tablet Take 1 tablet (40 mg total) by mouth 2 (two) times daily. 07/12/21   Sherran Needs, NP  gemfibrozil (LOPID) 600 MG tablet Take 600 mg by mouth at bedtime.     [provider]  glimepiride (AMARYL) 2 MG tablet Take 2 mg by mouth 2 (two) times daily.     [provider]  Insulin Glargine (BASAGLAR KWIKPEN) 100 UNIT/ML SOPN Inject 35 Units into the skin at bedtime.    [provider]  lisinopril (ZESTRIL) 40 MG tablet Take 40 mg by mouth daily. 11/22/20   [provider]  Melatonin 10 MG TABS Take 10 mg by mouth at bedtime.    [provider]  metFORMIN (GLUCOPHAGE-XR) 500 MG 24 hr tablet Take 1,000 mg by mouth in the morning and at bedtime. 11/11/19   [provider]  mineral oil liquid Take 15 mLs by mouth daily as needed for moderate constipation.    [provider]  Multiple Vitamins-Minerals (MULTIVITAMIN WITH MINERALS) tablet Take 1 tablet by mouth daily. Centrum men 50+    [provider]  Omega-3 1000 MG CAPS Take 1,000 mg by mouth daily.    [provider]  pioglitazone (ACTOS) 15 MG tablet Take 15 mg by mouth every morning. 02/22/21   [provider]  potassium chloride SA (KLOR-CON M20) 20 MEQ tablet Take 1 tablet (20 mEq total) by mouth daily. Patient not taking: No sig reported 07/04/21   Sherran Needs, NP  prednisoLONE acetate (PRED FORTE) 1 % ophthalmic suspension Place 1 drop into the left eye at bedtime.    [provider]  valACYclovir (VALTREX) 1000 MG tablet Take 1,000 mg by mouth at bedtime. 02/12/19   [provider]    Allergies    Tape  Review of Systems   Review of Systems  Constitutional:  Negative for appetite change and fatigue.  HENT:  Negative for congestion, ear discharge and sinus pressure.   Eyes:  Negative for discharge.  Respiratory:  Positive for shortness of breath. Negative for cough.   Cardiovascular:  Negative for chest pain.  Gastrointestinal:  Negative for abdominal pain and diarrhea.  Genitourinary:  Negative for frequency and hematuria.  Musculoskeletal:  Negative for back pain.  Skin:  Negative for rash.  Neurological:  Negative for seizures and headaches.  Psychiatric/Behavioral:  Negative for hallucinations.    Physical Exam Updated Vital Signs BP (!) 150/81   Pulse 92   Temp 98.4 F (36.9 C) (Oral)   Resp (!) 21   Ht 5\' 10"  (1.778 m)   Wt 116.1 kg   SpO2 94%   BMI 36.73 kg/m   Physical Exam Vitals and nursing note reviewed.  Constitutional:      Appearance: He is well-developed.  HENT:     Head: Normocephalic.     Mouth/Throat:     Mouth: Mucous membranes are moist.  Eyes:     General: No scleral icterus.    Conjunctiva/sclera: Conjunctivae normal.  Neck:     Thyroid: No thyromegaly.  Cardiovascular:     Rate and Rhythm: Normal rate and regular rhythm.     Heart sounds: No  murmur heard.   No friction rub. No gallop.  Pulmonary:     Breath sounds: No  stridor. No wheezing or rales.  Chest:     Chest wall: No tenderness.  Abdominal:     General: There is no distension.     Tenderness: There is no abdominal tenderness. There is no rebound.  Musculoskeletal:        General: Normal range of motion.     Cervical back: Neck supple.     Comments: 2+ edema up to his knees in both legs  Lymphadenopathy:     Cervical: No cervical adenopathy.  Skin:    Findings: No erythema or rash.  Neurological:     Mental Status: He is alert and oriented to person, place, and time.     Motor: No abnormal muscle tone.     Coordination: Coordination normal.  Psychiatric:        Behavior: Behavior normal.    ED Results / Procedures / Treatments   Labs (all labs ordered are listed, but only abnormal results are displayed) Labs Reviewed  CBC WITH DIFFERENTIAL/PLATELET - Abnormal; Notable for the following components:      Result Value   Hemoglobin 12.8 (*)    RDW 17.2 (*)    Lymphs Abs 0.3 (*)    All other components within normal limits  COMPREHENSIVE METABOLIC PANEL - Abnormal; Notable for the following components:   Glucose, Bld 152 (*)    Total Protein 8.6 (*)    Total Bilirubin 1.3 (*)    All other components within normal limits  BRAIN NATRIURETIC PEPTIDE - Abnormal; Notable for the following components:   B Natriuretic Peptide 240.0 (*)    All other components within normal limits  RESP PANEL BY RT-PCR (FLU A&B, COVID) ARPGX2  TROPONIN I (HIGH SENSITIVITY)  TROPONIN I (HIGH SENSITIVITY)    EKG None  Radiology DG Chest Port 1 View  Result Date: 08/09/2021 CLINICAL DATA:  Shortness of breath. EXAM: PORTABLE CHEST 1 VIEW COMPARISON:  Chest x-ray 11/24/2020. CT heart 05/31/2021. FINDINGS: The heart is enlarged. There are diffuse reticular opacities throughout both lungs increased from prior. There is no pleural effusion or pneumothorax. Prominent pleural fat in the lower right hemithorax appears unchanged. Heart is enlarged, unchanged. No  acute fractures are seen. There are postsurgical changes in the right shoulder. IMPRESSION: 1. Cardiomegaly. 2. Diffuse interstitial opacities worrisome for mild to moderate pulmonary edema. Infection is not excluded. Electronically Signed   By: Ronney Asters M.D.   On: 08/09/2021 18:08    Procedures Procedures   Medications Ordered in ED Medications  nitroGLYCERIN (NITROGLYN) 2 % ointment 1 inch (1 inch Topical Given 08/09/21 1747)  furosemide (LASIX) injection 40 mg (40 mg Intravenous Given 08/09/21 1751)    ED Course  I have reviewed the triage vital signs and the nursing notes.  Pertinent labs & imaging results that were available during my care of the patient were reviewed by me and considered in my medical decision making (see chart for details).   CRITICAL CARE Performed by: Milton Ferguson Total critical care time: 40 minutes Critical care time was exclusive of separately billable procedures and treating other patients. Critical care was necessary to treat or prevent imminent or life-threatening deterioration. Critical care was time spent personally by me on the following activities: development of treatment plan with patient and/or surrogate as well as nursing, discussions with consultants, evaluation of patient's response to treatment, examination of patient, obtaining history from patient or  surrogate, ordering and performing treatments and interventions, ordering and review of laboratory studies, ordering and review of radiographic studies, pulse oximetry and re-evaluation of patient's condition.  MDM Rules/Calculators/A&P                           Patient with hypoxia and congestive heart failure.  He will be admitted to the hospitalist service with possible cardiology consult tomorrow Final Clinical Impression(s) / ED Diagnoses Final diagnoses:  Systolic congestive heart failure, unspecified HF chronicity ([place])    Rx / DC Orders ED Discharge Orders     None         Milton Ferguson, MD 08/12/21 1308

## 2021-08-09 NOTE — Transfer of Care (Signed)
Immediate Anesthesia Transfer of Care Note  Patient: Lucas Dudley  Procedure(s) Performed: CARDIOVERSION  Patient Location: Endoscopy Unit  Anesthesia Type:General  Level of Consciousness: drowsy  Airway & Oxygen Therapy: Patient Spontanous Breathing and Patient connected to nasal cannula oxygen  Post-op Assessment: Report given to RN and Post -op Vital signs reviewed and stable  Post vital signs: Reviewed and stable  Last Vitals:  Vitals Value Taken Time  BP 145/72 08/09/21 1041  Temp    Pulse 72 08/09/21 1042  Resp 26 08/09/21 1042  SpO2 96 % 08/09/21 1042  Vitals shown include unvalidated device data.  Last Pain:  Vitals:   08/09/21 0957  TempSrc: Temporal         Complications: No notable events documented.

## 2021-08-09 NOTE — Telephone Encounter (Signed)
Patient daughter called in stating since coming home from cardioversion he is having increased shortness of breath and cough. Pt did not take his morning dose of lasix until 1pm today (he took 60mg ). Discussed with Roderic Palau NP instructed pt to take another 20mg  of lasix now and call with update in the morning. ER precautions were reviewed as well should symptoms worsen overnight. Pt and daughter verbalize understanding.

## 2021-08-09 NOTE — H&P (Addendum)
History and Physical  Carpenter TDV:[id number] DOB: April 15, 1941 DOA: 08/09/2021  PCP: [name], MD  Patient coming from: Home Level of care: Telemetry   Chief Complaint: CHF exacerbation  HPI: Lucas Dudley is a 80 y.o. male with medical history significant for HTN,  CHF and A. fib s/p scheduled cardioversion 08/09/21 who presented to the ED with SOB and cough that started after after returning home from his cardioversion.  Patient states that he called the cardioversion clinic and was instructed to take his Lasix; patient took 80 mg at home without improvement.  Patient was instructed by the clinic to come to the emergency department.  On presentation to the ED, patient's oxygen saturation at 84-85 on room air.  Patient does not use oxygen at home, but in the ED patient is requiring 3L.  Chest x-ray showed cardiomegaly and diffuse interstitial opacities worrisome of mild to moderate pulmonary edema.  EKGs showing sinus rhythm, heart rate 98 bpm, and QTc of 439.  Patient troponins slightly elevated, but remained stable at 16 and 18 respectively.  BNP elevated at 240.  Patient was given an additional 40 mg of IV Lasix. Patient does endorse improvement in breathing since receiving additional Lasix.  Patient denies chest pain, palpitations, fever, chills, headaches or nausea/vomiting. Patient tested positive for SARS COVID-19.  Patient hospitalization necessary for management and observation of CHF exacerbation and acute respiratory failure with hypoxia.  ED Course: While in the ED patient was given 40 mg of IV Lasix and nitroglycerin ointment.  Patient O2 duration at 84-85 on room air; patient was given supplemental oxygen.  Chest x-ray showed cardiomegaly and diffuse interstitial opacities worrisome for mild to moderate pulmonary edema.  Review of Systems: Review of Systems  Constitutional:  Negative for chills, fever and weight loss.  HENT: Negative.    Respiratory:   Positive for cough and shortness of breath.   Cardiovascular:  Positive for leg swelling. Negative for chest pain and palpitations.  Gastrointestinal: Negative.   Genitourinary: Negative.   Musculoskeletal: Negative.   Skin: Negative.   Neurological: Negative.   Endo/Heme/Allergies: Negative.   Psychiatric/Behavioral: Negative.      Past Medical History:  Diagnosis Date   Arthritis    Atrial fibrillation (HCC)    CAD (coronary artery disease)    a. Cath 03/17/15 showing 100% ostial D1, 50% prox LAD to mid LAD, 40% RPDA stenosis. Med rx. // Myoview 01/2020: EF 31 diffuse perfusion defect without reversibility (suspect artifact); reviewed with Dr. Aggie Cosier study felt to be low risk   Cataract    Mixed form OD   Chronic diastolic CHF (congestive heart failure) (HCC)    Diabetic retinopathy ([place])    NPDR OU   Essential hypertension    Glaucoma    POAG OU   History of gout    Hyperlipidemia    Hypertensive retinopathy    OU   Kidney stones    Melanoma of neck (HCC)    NICM (nonischemic cardiomyopathy) ([place])    OSA on CPAP 2012   Prostate cancer ([place])    Type II diabetes mellitus ([place])     Past Surgical History:  Procedure Laterality Date   ABDOMINAL HERNIA REPAIR     w/mesh   ATRIAL FIBRILLATION ABLATION N/A 06/06/2021   Procedure: ATRIAL FIBRILLATION ABLATION;  Surgeon: Thompson Grayer, MD;  Location: [place] [facility];  Service: Cardiovascular;  Laterality: N/A;   CARDIAC CATHETERIZATION N/A 03/17/2015   Procedure: Left Heart  Cath and Coronary Angiography;  Surgeon: Lorretta Harp, MD;  Location: [place] [facility];  Service: Cardiovascular;  Laterality: N/A;   carotid doppler  09/17/2008   rigt and left ICAs 0-49%;mildly  abnormal   CATARACT EXTRACTION Left 2020   Dr. Kathlen Mody   COLONOSCOPY N/A 05/16/2017   Procedure: COLONOSCOPY;  Surgeon: Rogene Houston, MD;  [facility];  Service: Endoscopy;  Laterality: N/A;  [place]  05/25/2009    EF 50-55%,LA mildly dilated, LV function normal   ELECTROPHYSIOLOGIC STUDY N/A 04/05/2015   Procedure: Cardioversion;  Surgeon: Sanda Klein, MD;  Location: [place] [facility];  Service: Cardiovascular;  Laterality: N/A;   ELECTROPHYSIOLOGIC STUDY N/A 09/06/2015   Procedure: Atrial Fibrillation Ablation;  Surgeon: Thompson Grayer, MD;  Location: [place] [facility];  Service: Cardiovascular;  Laterality: N/A;   ELECTROPHYSIOLOGIC STUDY N/A 07/12/2016   [facility] afib ablation by Dr Rayann Heman   EXCISIONAL HEMORRHOIDECTOMY     "inside and out"   EYE SURGERY Left 2020   Cat Sx - Dr. Kathlen Mody   FINE NEEDLE ASPIRATION Right    knee; "drew ~ 1 quart off"   [place] Right    "neck"   NM MYOCAR PERF WALL MOTION  02/21/2012   EF 61% ,EXERCISE 7 METS. exercise stopped due to wheezing and shortness of breathe   POLYPECTOMY  05/16/2017   Procedure: POLYPECTOMY;  Surgeon: Rogene Houston, MD;  [facility];  Service: Endoscopy;;  colon   PROSTATECTOMY     SHOULDER OPEN ROTATOR CUFF REPAIR Right X 2   TEE WITHOUT CARDIOVERSION N/A 09/05/2015   Procedure: TRANSESOPHAGEAL ECHOCARDIOGRAM (TEE);  Surgeon: Sueanne Margarita, MD;  Location[facility];  Service: Cardiovascular;  Laterality: N/A;     reports that he quit smoking about 37 years ago. His smoking use included cigarettes. He has a 54.00 pack-year smoking history. He has never used smokeless tobacco. He reports that he does not drink alcohol and does not use drugs.  Allergies  Allergen Reactions   Tape Rash and Other (See Comments)    Causes skin redness, Use paper tape only.    Family History  Problem Relation Age of Onset   Heart disease Mother    Lung cancer Mother    Heart attack Mother 50   Stroke Brother    Healthy Daughter    Diabetes Father    Heart disease Father     Prior to Admission medications   Medication Sig Start Date End Date Taking? Authorizing Provider   amLODipine (NORVASC) 10 MG tablet Take 0.5 tablets (5 mg total) by mouth at bedtime. Patient taking differently: Take 10 mg by mouth at bedtime. 07/04/21   Sherran Needs, NP  apixaban (ELIQUIS) 5 MG TABS tablet Take 1 tablet (5 mg total) by mouth 2 (two) times daily. 04/21/21   Allred, Jeneen Rinks, MD  carvedilol (COREG) 6.25 MG tablet Takes 1 tablet by mouth in the AM and 1 tablet by mouth in the PM 07/04/21   Sherran Needs, NP  diphenhydramine-acetaminophen (TYLENOL PM) 25-500 MG TABS tablet Take 2 tablets by mouth at bedtime.    [provider]  Docusate Calcium (STOOL SOFTENER PO) Take 100 mg by mouth every evening.    [provider]  dorzolamide-timolol (COSOPT) 22.3-6.8 MG/ML ophthalmic solution Place 1 drop into both eyes 2 (two) times daily. Patient taking differently: Place 1  drop into both eyes at bedtime. 12/02/20   Bernarda Caffey, MD  furosemide (LASIX) 40 MG tablet Take 1 tablet (40 mg total) by mouth 2 (two) times daily. 07/12/21   Sherran Needs, NP  gemfibrozil (LOPID) 600 MG tablet Take 600 mg by mouth at bedtime.     [provider]  glimepiride (AMARYL) 2 MG tablet Take 2 mg by mouth 2 (two) times daily.     [provider]  Insulin Glargine (BASAGLAR KWIKPEN) 100 UNIT/ML SOPN Inject 35 Units into the skin at bedtime.    [provider]  lisinopril (ZESTRIL) 40 MG tablet Take 40 mg by mouth daily. 11/22/20   [provider]  Melatonin 10 MG TABS Take 10 mg by mouth at bedtime.    [provider]  metFORMIN (GLUCOPHAGE-XR) 500 MG 24 hr tablet Take 1,000 mg by mouth in the morning and at bedtime. 11/11/19   [provider]  mineral oil liquid Take 15 mLs by mouth daily as needed for moderate constipation.    [provider]  Multiple Vitamins-Minerals (MULTIVITAMIN WITH MINERALS) tablet Take 1 tablet by mouth daily. Centrum men 50+    [provider]  Omega-3 1000 MG CAPS Take 1,000 mg by mouth  daily.    [provider]  pioglitazone (ACTOS) 15 MG tablet Take 15 mg by mouth every morning. 02/22/21   [provider]  potassium chloride SA (KLOR-CON M20) 20 MEQ tablet Take 1 tablet (20 mEq total) by mouth daily. Patient not taking: No sig reported 07/04/21   Sherran Needs, NP  prednisoLONE acetate (PRED FORTE) 1 % ophthalmic suspension Place 1 drop into the left eye at bedtime.    [provider]  valACYclovir (VALTREX) 1000 MG tablet Take 1,000 mg by mouth at bedtime. 02/12/19   [provider]    Physical Exam: Vitals:   08/09/21 1830 08/09/21 1854 08/09/21 1900 08/09/21 1930  BP: (!) 147/72  (!) 155/67 (!) 150/81  Pulse: 89 92 92 92  Resp: (!) 27 17 (!) 23 (!) 21  Temp:      TempSrc:      SpO2: 93% 92% (!) 89% 94%  Weight:      Height:        Constitutional: Obese male lying in bed, calm, No acute distress.   Eyes: PERRL, lids and conjunctivae normal ENMT: Mucous membranes are moist. Posterior pharynx clear of any exudate or lesions.Normal dentition.  Neck: normal, supple, no masses, no thyromegaly Respiratory: Faint crackles heard bilaterally, cough present. no wheezing.  Slightly increased respiratory effort. No accessory muscle use.  Cardiovascular: normal s1, s2 sounds, no murmurs / rubs / gallops. 2+ edema.  No carotid bruits.  Abdomen: no tenderness, no masses palpated. No hepatosplenomegaly. Bowel sounds positive.  Musculoskeletal: no clubbing / cyanosis. No joint deformity upper and lower extremities. Good ROM, no contractures. Normal muscle tone.  Skin: no rashes, lesions, ulcers. No induration Neurologic: CN 2-12 grossly intact. Sensation intact, DTR normal. Strength 5/5 in all 4.  Psychiatric: Normal judgment and insight. Alert and oriented x 3. Normal mood.   Labs on Admission: I have personally reviewed following labs and imaging studies  CBC: Recent Labs  Lab 08/09/21 1016 08/09/21 1753  WBC  --  7.5  NEUTROABS  --   6.5  HGB 12.6* 12.8*  HCT 37.0* 40.9  MCV  --  92.5  PLT  --  631   Basic Metabolic Panel: Recent Labs  Lab 08/09/21  1016 08/09/21 1753  NA 141 136  K 3.9 3.6  CL 102 98  CO2  --  28  GLUCOSE 97 152*  BUN 21 23  CREATININE 1.10 1.09  CALCIUM  --  9.0   GFR: Estimated Creatinine Clearance: 71.3 mL/min (by C-G formula based on SCr of 1.09 mg/dL). Liver Function Tests: Recent Labs  Lab 08/09/21 1753  AST 20  ALT 16  ALKPHOS 60  BILITOT 1.3*  PROT 8.6*  ALBUMIN 4.3   No results for input(s): LIPASE, AMYLASE in the last 168 hours. No results for input(s): AMMONIA in the last 168 hours. Coagulation Profile: No results for input(s): INR, PROTIME in the last 168 hours. Cardiac Enzymes: No results for input(s): CKTOTAL, CKMB, CKMBINDEX, TROPONINI in the last 168 hours. BNP (last 3 results) No results for input(s): PROBNP in the last 8760 hours. HbA1C: No results for input(s): HGBA1C in the last 72 hours. CBG: No results for input(s): GLUCAP in the last 168 hours. Lipid Profile: No results for input(s): CHOL, HDL, LDLCALC, TRIG, CHOLHDL, LDLDIRECT in the last 72 hours. Thyroid Function Tests: No results for input(s): TSH, T4TOTAL, FREET4, T3FREE, THYROIDAB in the last 72 hours. Anemia Panel: No results for input(s): VITAMINB12, FOLATE, FERRITIN, TIBC, IRON, RETICCTPCT in the last 72 hours. Urine analysis:    Component Value Date/Time   COLORURINE YELLOW 10/18/2018 1525   APPEARANCEUR CLEAR 10/18/2018 1525   LABSPEC 1.012 10/18/2018 1525   PHURINE 5.0 10/18/2018 1525   GLUCOSEU 50 (A) 10/18/2018 1525   HGBUR MODERATE (A) 10/18/2018 1525   BILIRUBINUR NEGATIVE 10/18/2018 1525   KETONESUR NEGATIVE 10/18/2018 1525   PROTEINUR 100 (A) 10/18/2018 1525   NITRITE NEGATIVE 10/18/2018 [place] 10/18/2018 1525    Radiological Exams on Admission: DG Chest Port 1 View  Result Date: 08/09/2021 CLINICAL DATA:  Shortness of breath. EXAM: PORTABLE CHEST  1 VIEW COMPARISON:  Chest x-ray 11/24/2020. CT heart 05/31/2021. FINDINGS: The heart is enlarged. There are diffuse reticular opacities throughout both lungs increased from prior. There is no pleural effusion or pneumothorax. Prominent pleural fat in the lower right hemithorax appears unchanged. Heart is enlarged, unchanged. No acute fractures are seen. There are postsurgical changes in the right shoulder. IMPRESSION: 1. Cardiomegaly. 2. Diffuse interstitial opacities worrisome for mild to moderate pulmonary edema. Infection is not excluded. Electronically Signed   By: Ronney Asters M.D.   On: 08/09/2021 18:08    EKG: Independently reviewed.   Assessment/Plan Active Problems:   Benign essential HTN   Diabetes mellitus with no complication (HCC)   OSA (obstructive sleep apnea)   A-fib (HCC)   CHF exacerbation (HCC)   Acute respiratory failure with hypoxia (HCC)     1) CHF exacerbation/ Acute Respiratory Failure with Hypoxia - Continue Lasix 40 mg twice daily - Continue supplemental oxygen. Wean oxygen as patient tolerates.  2) COVID-19 -Patient positive for SARS COVID-19 -Continue supplemental oxygen as stated above.  As stated above.  3) Essential Hypertension/ A. fib - Continue home amlodipine 10 mg - Continue home carvedilol 6.25 mg twice daily -Continue home lisinopril 40 mg  4) T2DM -Hold home metformin while hospitalized. -SSI per hospital protocol.   DVT prophylaxis: Continue home Eliquis Code Status: Full Family Communication: Patient's daughter at bedside Consults called: N/A Admission status: Observation Level of care: Telemetry  Valentino Nose Sweet MS4  08/09/2021, 9:02 PM    Attestation signed by Carlynn Purl B, DO at 08/09/2021 11:25 PM (Updated)   Was present  with the medical student for the service.  I personally verified the history of present illness and performed a physical examination and medical decision making.  I have verified all of the medical  student's documentation for this encounter.   Lucas Dudley is a 80 year old gentleman who presented today for shortness of breath.  He had scheduled cardioversion for atrial fibrillation on the same day's presentation.  Daughter at bedside helps with history, and reports that patient had had increasing peripheral edema for several months, but it was much worse than normal recently.  She reports the edema needs to stay at his ankles, now was as high as his thighs and possibly even his abdomen.  Patient was persistently in atrial fibrillation despite previous ablation.  According to the daughter it was decided to do cardioversion to abort the A. fib, and control the peripheral edema another signs of CHF, including orthopnea.  After the cardioversion patient became acutely short of breath.  He reports no chest pain, no palpitations, no dizziness, no diaphoresis, no nausea.  Daughter reports that patient was tripoding from 2 PM until they left for the hospital at 4 PM.  In that interim they had called the cardio clinic who advised them to take extra dose of Lasix, resulting in 40 mg of p.o. Lasix.  When this did not offer any relief there was sent into the ED.  Of note patient is a former smoker who quit 30 years ago.  He does not drink alcohol, does not use illicit drugs.  He is vaccinated for COVID.  Patient is full code.  Patient does have an incidental finding of COVID-positive today.  He has been started on molnupiravir, as paxlovid would have interfered with his Eliquis.         Assessment and plan:   Acute congestive heart failure exacerbation Major Criteria Fine crackles, cardiomegaly, orthopnea, and pulm edema on CXR  Minor criteria BL leg edema, dyspnea on exertion IV diuresis initiated with 40mg  IV lasix, continue with 40mg  IV lasix BID Last Echo 05/05/21 - 60 - 65% EF with indeterminate diastolic parameters Daily weights, fluid restriction, strict Is and Os Continue lisinopril and Coreg, home  meds for medical optimization - patient has myalgias with lipitor, and believes that lipitor caused his brother to have ALS - so he declines statin Atrial fibrillation Cardioverted today EKG shows sinus rhythm Continue Eliquis and Coreg Acute hypoxic respiratory failure With oxygen saturations down to the low 80s Initially requiring 6 L nasal cannula at presentation to the ED, but after Lasix now requiring 3 L nasal cannula Most likely secondary to #1 Anticipation of improvement with diuresis Continue oxygen supplementation, but wean off as tolerated Continue to monitor Diabetes mellitus type 2 Takes 35 units of long-acting insulin at home Reduced basal insulin while in the hospital, sliding scale coverage Hemoglobin A1c pending Continue to monitor Hypertension Continue Norvasc, lisinopril, Coreg Patient is currently on Nitropatch, also starting Norvasc on the morning of the 27th to avoid over correcting hypertension Hyperlipidemia Patient is not on a statin secondary to myalgias and the believe that statin may have caused brother to have terminal disease Resume gemfibrozil at discharge Obstructive sleep apnea CPAP nightly   Somalia Zierle Orlin Hilding DO 08/09/21 23:24

## 2021-08-09 NOTE — ED Notes (Signed)
Date and time results received: 08/09/21 8:50 PM  Test: COVID Critical Value: Positive   Name of Provider Notified: Dr.Z  Orders Received? Or Actions Taken?:

## 2021-08-09 NOTE — Discharge Instructions (Signed)

## 2021-08-09 NOTE — ED Triage Notes (Signed)
Pt brought in by daughter due to [facility] since about 12pm today. Called clinic and told to take fluid pills. Has been having increased difficulty since. Was seen this am for cardioversion.

## 2021-08-09 NOTE — Interval H&P Note (Signed)
History and Physical Interval Note:  08/09/2021 10:33 AM  Lucas Dudley  has presented today for surgery, with the diagnosis of AFIB.  The various methods of treatment have been discussed with the patient and family. After consideration of risks, benefits and other options for treatment, the patient has consented to  Procedure(s): CARDIOVERSION (N/A) as a surgical intervention.  The patient's history has been reviewed, patient examined, no change in status, stable for surgery.  I have reviewed the patient's chart and labs.  Questions were answered to the patient's satisfaction.     [name], MD

## 2021-08-09 NOTE — ED Notes (Signed)
Ambulated pt to nurses station 02 hovering around 84-85 room air

## 2021-08-10 ENCOUNTER — Observation Stay (HOSPITAL_COMMUNITY): Payer: Medicare Other

## 2021-08-10 ENCOUNTER — Encounter (HOSPITAL_COMMUNITY): Payer: Self-pay | Admitting: Cardiovascular Disease

## 2021-08-10 DIAGNOSIS — J9601 Acute respiratory failure with hypoxia: Secondary | ICD-10-CM

## 2021-08-10 DIAGNOSIS — I428 Other cardiomyopathies: Secondary | ICD-10-CM | POA: Diagnosis present

## 2021-08-10 DIAGNOSIS — I4819 Other persistent atrial fibrillation: Secondary | ICD-10-CM | POA: Diagnosis not present

## 2021-08-10 DIAGNOSIS — Z7984 Long term (current) use of oral hypoglycemic drugs: Secondary | ICD-10-CM | POA: Diagnosis not present

## 2021-08-10 DIAGNOSIS — I480 Paroxysmal atrial fibrillation: Secondary | ICD-10-CM | POA: Diagnosis present

## 2021-08-10 DIAGNOSIS — I5033 Acute on chronic diastolic (congestive) heart failure: Secondary | ICD-10-CM | POA: Diagnosis not present

## 2021-08-10 DIAGNOSIS — I10 Essential (primary) hypertension: Secondary | ICD-10-CM

## 2021-08-10 DIAGNOSIS — I509 Heart failure, unspecified: Secondary | ICD-10-CM | POA: Diagnosis present

## 2021-08-10 DIAGNOSIS — Z85820 Personal history of malignant melanoma of skin: Secondary | ICD-10-CM | POA: Diagnosis not present

## 2021-08-10 DIAGNOSIS — E876 Hypokalemia: Secondary | ICD-10-CM | POA: Diagnosis present

## 2021-08-10 DIAGNOSIS — E669 Obesity, unspecified: Secondary | ICD-10-CM | POA: Diagnosis present

## 2021-08-10 DIAGNOSIS — Z91048 Other nonmedicinal substance allergy status: Secondary | ICD-10-CM | POA: Diagnosis not present

## 2021-08-10 DIAGNOSIS — I5043 Acute on chronic combined systolic (congestive) and diastolic (congestive) heart failure: Secondary | ICD-10-CM | POA: Diagnosis present

## 2021-08-10 DIAGNOSIS — Z8546 Personal history of malignant neoplasm of prostate: Secondary | ICD-10-CM | POA: Diagnosis not present

## 2021-08-10 DIAGNOSIS — I110 Hypertensive heart disease with heart failure: Secondary | ICD-10-CM | POA: Diagnosis present

## 2021-08-10 DIAGNOSIS — Z87891 Personal history of nicotine dependence: Secondary | ICD-10-CM | POA: Diagnosis not present

## 2021-08-10 DIAGNOSIS — E113293 Type 2 diabetes mellitus with mild nonproliferative diabetic retinopathy without macular edema, bilateral: Secondary | ICD-10-CM | POA: Diagnosis present

## 2021-08-10 DIAGNOSIS — Z833 Family history of diabetes mellitus: Secondary | ICD-10-CM | POA: Diagnosis not present

## 2021-08-10 DIAGNOSIS — U071 COVID-19: Secondary | ICD-10-CM | POA: Diagnosis present

## 2021-08-10 DIAGNOSIS — M109 Gout, unspecified: Secondary | ICD-10-CM | POA: Diagnosis present

## 2021-08-10 DIAGNOSIS — G4733 Obstructive sleep apnea (adult) (pediatric): Secondary | ICD-10-CM | POA: Diagnosis present

## 2021-08-10 DIAGNOSIS — Z79899 Other long term (current) drug therapy: Secondary | ICD-10-CM | POA: Diagnosis not present

## 2021-08-10 DIAGNOSIS — E119 Type 2 diabetes mellitus without complications: Secondary | ICD-10-CM

## 2021-08-10 DIAGNOSIS — Z8249 Family history of ischemic heart disease and other diseases of the circulatory system: Secondary | ICD-10-CM | POA: Diagnosis not present

## 2021-08-10 DIAGNOSIS — I2510 Atherosclerotic heart disease of native coronary artery without angina pectoris: Secondary | ICD-10-CM | POA: Diagnosis present

## 2021-08-10 DIAGNOSIS — E1165 Type 2 diabetes mellitus with hyperglycemia: Secondary | ICD-10-CM | POA: Diagnosis present

## 2021-08-10 DIAGNOSIS — Z7901 Long term (current) use of anticoagulants: Secondary | ICD-10-CM | POA: Diagnosis not present

## 2021-08-10 DIAGNOSIS — E785 Hyperlipidemia, unspecified: Secondary | ICD-10-CM | POA: Diagnosis present

## 2021-08-10 LAB — CBG MONITORING, ED
Glucose-Capillary: 169 mg/dL — ABNORMAL HIGH (ref 70–99)
Glucose-Capillary: 151 mg/dL — ABNORMAL HIGH (ref 70–99)
Glucose-Capillary: 162 mg/dL — ABNORMAL HIGH (ref 70–99)
Glucose-Capillary: 109 mg/dL — ABNORMAL HIGH (ref 70–99)

## 2021-08-10 LAB — ECHOCARDIOGRAM COMPLETE
AR max vel: 2.57 cm2
AV Area VTI: 2.57 cm2
AV Area mean vel: 2.17 cm2
AV Mean grad: 3.5 mmHg
AV Peak grad: 7 mmHg
Ao pk vel: 1.32 m/s
Area-P 1/2: 5.23 cm2
Height: 70 in
MV VTI: 2.35 cm2
S' Lateral: 2.8 cm
Weight: 4096 oz

## 2021-08-10 LAB — COMPREHENSIVE METABOLIC PANEL
ALT: 14 U/L (ref 0–44)
AST: 18 U/L (ref 15–41)
Albumin: 4 g/dL (ref 3.5–5.0)
Alkaline Phosphatase: 51 U/L (ref 38–126)
Anion gap: 11 (ref 5–15)
BUN: 21 mg/dL (ref 8–23)
CO2: 29 mmol/L (ref 22–32)
Calcium: 8.5 mg/dL — ABNORMAL LOW (ref 8.9–10.3)
Chloride: 94 mmol/L — ABNORMAL LOW (ref 98–111)
Creatinine, Ser: 0.98 mg/dL (ref 0.61–1.24)
GFR, Estimated: >60 mL/min (ref >60)
Glucose, Bld: 177 mg/dL — ABNORMAL HIGH (ref 70–99)
Potassium: 3 mmol/L — ABNORMAL LOW (ref 3.5–5.1)
Sodium: 134 mmol/L — ABNORMAL LOW (ref 135–145)
Total Bilirubin: 1.5 mg/dL — ABNORMAL HIGH (ref 0.3–1.2)
Total Protein: 7.5 g/dL (ref 6.5–8.1)

## 2021-08-10 LAB — HEMOGLOBIN A1C
Hgb A1c MFr Bld: 7.8 % — ABNORMAL HIGH (ref 4.8–5.6)
Mean Plasma Glucose: 177.16 mg/dL

## 2021-08-10 LAB — CBC
HCT: 36.5 % — ABNORMAL LOW (ref 39.0–52.0)
Hemoglobin: 11.2 g/dL — ABNORMAL LOW (ref 13.0–17.0)
MCH: 28.1 pg (ref 26.0–34.0)
MCHC: 30.7 g/dL (ref 30.0–36.0)
MCV: 91.7 fL (ref 80.0–100.0)
Platelets: 154 10*3/uL (ref 150–400)
RBC: 3.98 MIL/uL — ABNORMAL LOW (ref 4.22–5.81)
RDW: 17 % — ABNORMAL HIGH (ref 11.5–15.5)
WBC: 5.8 10*3/uL (ref 4.0–10.5)
nRBC: 0 % (ref 0.0–0.2)

## 2021-08-10 LAB — C-REACTIVE PROTEIN: CRP: 3.8 mg/dL — ABNORMAL HIGH (ref <1.0)

## 2021-08-10 LAB — TSH: TSH: 0.932 u[IU]/mL (ref 0.350–4.500)

## 2021-08-10 LAB — GLUCOSE, CAPILLARY: Glucose-Capillary: 168 mg/dL — ABNORMAL HIGH (ref 70–99)

## 2021-08-10 LAB — MAGNESIUM: Magnesium: 1.5 mg/dL — ABNORMAL LOW (ref 1.7–2.4)

## 2021-08-10 LAB — D-DIMER, QUANTITATIVE: D-Dimer, Quant: 0.9 ug/mL-FEU — ABNORMAL HIGH (ref 0.00–0.50)

## 2021-08-10 MED ORDER — FOLIC ACID 1 MG PO TABS
1.0000 mg | ORAL_TABLET | Freq: Every day | ORAL | Status: DC
  Administered 2021-08-10 – 2021-08-15 (×6): 1 mg via ORAL
  Filled 2021-08-10 (×6): qty 1

## 2021-08-10 MED ORDER — ASCORBIC ACID 500 MG PO TABS
500.0000 mg | ORAL_TABLET | Freq: Every day | ORAL | Status: DC
  Administered 2021-08-10 – 2021-08-15 (×6): 500 mg via ORAL
  Filled 2021-08-10 (×6): qty 1

## 2021-08-10 MED ORDER — MAGNESIUM SULFATE 4 GM/100ML IV SOLN
4.0000 g | Freq: Once | INTRAVENOUS | Status: AC
  Administered 2021-08-10: 4 g via INTRAVENOUS
  Filled 2021-08-10: qty 100

## 2021-08-10 MED ORDER — THIAMINE HCL 100 MG PO TABS
100.0000 mg | ORAL_TABLET | Freq: Every day | ORAL | Status: DC
  Administered 2021-08-10 – 2021-08-15 (×6): 100 mg via ORAL
  Filled 2021-08-10 (×6): qty 1

## 2021-08-10 MED ORDER — POTASSIUM CHLORIDE CRYS ER 20 MEQ PO TBCR
40.0000 meq | EXTENDED_RELEASE_TABLET | ORAL | Status: AC
  Administered 2021-08-10 (×3): 40 meq via ORAL
  Filled 2021-08-10 (×3): qty 2

## 2021-08-10 MED ORDER — ZINC SULFATE 220 (50 ZN) MG PO CAPS
220.0000 mg | ORAL_CAPSULE | Freq: Every day | ORAL | Status: DC
  Administered 2021-08-10 – 2021-08-15 (×6): 220 mg via ORAL
  Filled 2021-08-10 (×6): qty 1

## 2021-08-10 MED ORDER — ADULT MULTIVITAMIN W/MINERALS CH
1.0000 | ORAL_TABLET | Freq: Every day | ORAL | Status: DC
  Administered 2021-08-10 – 2021-08-15 (×6): 1 via ORAL
  Filled 2021-08-10 (×6): qty 1

## 2021-08-10 MED ORDER — ORAL CARE MOUTH RINSE
15.0000 mL | Freq: Two times a day (BID) | OROMUCOSAL | Status: DC
  Administered 2021-08-11 – 2021-08-15 (×6): 15 mL via OROMUCOSAL

## 2021-08-10 NOTE — Anesthesia Postprocedure Evaluation (Signed)
Anesthesia Post Note  Patient: Lucas Dudley  Procedure(s) Performed: CARDIOVERSION     Patient location during evaluation: PACU Anesthesia Type: General Level of consciousness: awake and alert and oriented Pain management: pain level controlled Vital Signs Assessment: post-procedure vital signs reviewed and stable Respiratory status: spontaneous breathing, nonlabored ventilation and respiratory function stable Cardiovascular status: blood pressure returned to baseline Postop Assessment: no apparent nausea or vomiting Anesthetic complications: no   No notable events documented.  Last Vitals:  Vitals:   08/09/21 1054 08/09/21 1104  BP: (!) 147/75 (!) 149/55  Pulse: 72 73  Resp: 18 19  Temp: 36.5 C   SpO2: 91% 92%    Last Pain:  Vitals:   08/09/21 1104  TempSrc:   PainSc: 0-No pain                 Marthenia Rolling

## 2021-08-10 NOTE — ED Notes (Signed)
Pt missed urinal and urinated in the floor. Socks changed and assisted to get back in the bed.

## 2021-08-10 NOTE — ED Notes (Signed)
Meal given

## 2021-08-10 NOTE — Anesthesia Preprocedure Evaluation (Signed)
Anesthesia Evaluation  Patient identified by MRN, date of birth, ID band Patient awake    Reviewed: Allergy & Precautions, NPO status , Patient's Chart, lab work & pertinent test results  History of Anesthesia Complications Negative for: history of anesthetic complications  Airway Mallampati: II  TM Distance: >3 FB Neck ROM: Full    Dental no notable dental hx.    Pulmonary sleep apnea , former smoker,    Pulmonary exam normal        Cardiovascular hypertension, Pt. on medications + CAD and +CHF  Normal cardiovascular exam+ dysrhythmias Atrial Fibrillation   TTE 04/2021: EF 60-65%, moderate LVH, moderate LAE/RAE, valves ok   Neuro/Psych negative neurological ROS  negative psych ROS   GI/Hepatic negative GI ROS, Neg liver ROS,   Endo/Other  diabetes, Type 2, Oral Hypoglycemic Agents  Renal/GU negative Renal ROS  negative genitourinary   Musculoskeletal  (+) Arthritis ,   Abdominal   Peds  Hematology negative hematology ROS (+)   Anesthesia Other Findings Day of surgery medications reviewed with patient.  Reproductive/Obstetrics negative OB ROS                             Anesthesia Physical Anesthesia Plan  ASA: 3  Anesthesia Plan: General   Post-op Pain Management:    Induction: Intravenous  PONV Risk Score and Plan: Treatment may vary due to age or medical condition and Propofol infusion  Airway Management Planned: Mask  Additional Equipment: None  Intra-op Plan:   Post-operative Plan:   Informed Consent: I have reviewed the patients History and Physical, chart, labs and discussed the procedure including the risks, benefits and alternatives for the proposed anesthesia with the patient or authorized representative who has indicated his/her understanding and acceptance.       Plan Discussed with: CRNA  Anesthesia Plan Comments:         Anesthesia Quick  Evaluation

## 2021-08-10 NOTE — Progress Notes (Signed)
Patient declined CPAP since we do not have the mask he uses at home. States he cannot tolerate the two options we have here. Patient is doing well on nasal cannula and stated that worked last night while he was in the ED. Suggested to family member that she bring his home unit in tomorrow for use (if he is still admitted to the hospital). Told patient to call if he needed anything from me tonight or changed his mind about the CPAP. No unit in room at this time.

## 2021-08-10 NOTE — ED Notes (Signed)
pt stated that he wanted to go home.  I explained to pt that he is on 5 lpm o2 via [place] and that he has no 02 at home.  I took pt off o2 to see if he dropped.  pt o2 sats dropped to 89% with in 2 min.  I told him that the fluid combined with covid would not be a good out come considering how his cxr looks and that he would probably would have to return to the hospital.

## 2021-08-10 NOTE — Progress Notes (Signed)
PROGRESS NOTE    Lucas Dudley  GHW:[id number] DOB: 01/30/1941 DOA: 08/09/2021 PCP: [name], MD    Brief Narrative:  80 year old male who presented to the emergency room with shortness of breath.  He underwent cardioversion for atrial fibrillation on the same day of presentation.  He was noted to be in decompensated CHF.  Admitted for IV diuretics.  Also found to be incidentally positive for COVID.  Patient has been vaccinated.  He is requiring supplemental oxygen due to CHF exacerbation.   Assessment & Plan:   Active Problems:   Benign essential HTN   Diabetes mellitus with no complication (HCC)   OSA (obstructive sleep apnea)   A-fib (HCC)   CHF exacerbation (HCC)   Acute respiratory failure with hypoxia (HCC)   Acute on chronic diastolic congestive heart failure -Continues to have evidence of volume overload -Appears to be having good urine output with IV Lasix -He is already on beta-blocker and ACE inhibitor -Continue current treatments  Acute respiratory failure with hypoxia -He was noted to be hypoxic on room air and has been having significant orthopnea -He is not normally on any oxygen -We will continue to try and wean off oxygen as he diuresis  COVID-19 infection -He did test positive for COVID-19 -Started on molnupiravir -Has been refusing medication since he does feel that it is making his shortness of breath worse -I discussed the option of transitioning to remdesivir, but patient's daughter does not wish for him to receive this -Continue to follow inflammatory markers, currently minimally elevated -If they do start to trend up, would consider starting on steroids.  Although currently I suspect that his shortness of breath is related to CHF exacerbation  Paroxysmal atrial fibrillation -Patient underwent cardioversion on 10/26 -Continue on Coreg -Anticoagulated with Eliquis  Obstructive sleep apnea -Continue on  CPAP  Hypokalemia/hypomagnesemia -Replace  Diabetes -Blood sugars currently stable -On sliding scale and Levemir   DVT prophylaxis: SCDs Start: 08/09/21 2013 apixaban (ELIQUIS) tablet 5 mg  Code Status: Full code Family Communication: Updated patient's daughter, [name] over the phone Disposition Plan: Status is: Inpatient  Remains inpatient appropriate because: He is continued IV diuresis         Consultants:    Procedures:  Echo with EF 60-65%  Antimicrobials:      Subjective: Still having shortness of breath and significant lower extremity edema.  No chest pain.  Objective: Vitals:   08/10/21 1151 08/10/21 1500 08/10/21 1600 08/10/21 1700  BP: 119/71 130/72 (!) 142/87 125/66  Pulse: 83 81 62 73  Resp: (!) 22 (!) 22 18 18   Temp:    98.7 F (37.1 C)  TempSrc:      SpO2: 99% 96% 92% 99%  Weight:      Height:        Intake/Output Summary (Last 24 hours) at 08/10/2021 1740 Last data filed at 08/10/2021 1436 Gross per 24 hour  Intake 98.99 ml  Output 1350 ml  Net -1251.01 ml   Filed Weights   08/09/21 1651  Weight: 116.1 kg    Examination:  General exam: Appears calm and comfortable  Respiratory system: crackles at bases Respiratory effort normal. Cardiovascular system: S1 & S2 heard, RRR. No JVD, murmurs, rubs, gallops or clicks. 2+ pedal edema. Gastrointestinal system: Abdomen is nondistended, soft and nontender. No organomegaly or masses felt. Normal bowel sounds heard. Central nervous system: Alert and oriented. No focal neurological deficits. Extremities: Symmetric 5 x 5 power. Skin: No rashes, lesions or ulcers Psychiatry:  Judgement and insight appear normal. Mood & affect appropriate.     Data Reviewed: I have personally reviewed following labs and imaging studies  CBC: Recent Labs  Lab 08/09/21 1016 08/09/21 1753 08/10/21 0343  WBC  --  7.5 5.8  NEUTROABS  --  6.5  --   HGB 12.6* 12.8* 11.2*  HCT 37.0* 40.9 36.5*  MCV  --   92.5 91.7  PLT  --  179 034   Basic Metabolic Panel: Recent Labs  Lab 08/09/21 1016 08/09/21 1753 08/10/21 0343  NA 141 136 134*  K 3.9 3.6 3.0*  CL 102 98 94*  CO2  --  28 29  GLUCOSE 97 152* 177*  BUN 21 23 21   CREATININE 1.10 1.09 0.98  CALCIUM  --  9.0 8.5*  MG  --   --  1.5*   GFR: Estimated Creatinine Clearance: 79.3 mL/min (by C-G formula based on SCr of 0.98 mg/dL). Liver Function Tests: Recent Labs  Lab 08/09/21 1753 08/10/21 0343  AST 20 18  ALT 16 14  ALKPHOS 60 51  BILITOT 1.3* 1.5*  PROT 8.6* 7.5  ALBUMIN 4.3 4.0   No results for input(s): LIPASE, AMYLASE in the last 168 hours. No results for input(s): AMMONIA in the last 168 hours. Coagulation Profile: No results for input(s): INR, PROTIME in the last 168 hours. Cardiac Enzymes: No results for input(s): CKTOTAL, CKMB, CKMBINDEX, TROPONINI in the last 168 hours. BNP (last 3 results) No results for input(s): PROBNP in the last 8760 hours. HbA1C: Recent Labs    08/10/21 0343  HGBA1C 7.8*   CBG: Recent Labs  Lab 08/09/21 2116 08/10/21 0614 08/10/21 0810 08/10/21 1222 08/10/21 1624  GLUCAP 150* 169* 151* 162* 109*   Lipid Profile: No results for input(s): CHOL, HDL, LDLCALC, TRIG, CHOLHDL, LDLDIRECT in the last 72 hours. Thyroid Function Tests: Recent Labs    08/10/21 0343  TSH 0.932   Anemia Panel: No results for input(s): VITAMINB12, FOLATE, FERRITIN, TIBC, IRON, RETICCTPCT in the last 72 hours. Sepsis Labs: No results for input(s): PROCALCITON, LATICACIDVEN in the last 168 hours.  Recent Results (from the past 240 hour(s))  Resp Panel by RT-PCR (Flu A&B, Covid) Nasopharyngeal Swab     Status: Abnormal   Collection Time: 08/09/21  7:39 PM   Specimen: Nasopharyngeal Swab; Nasopharyngeal(NP) swabs in vial transport medium  Result Value Ref Range Status   SARS Coronavirus 2 by RT PCR POSITIVE (A) NEGATIVE Final    Comment: RESULT CALLED TO, READ BACK BY AND VERIFIED  WITH: Nichols,K@2116   by Matthews,B 10.26.2022 (NOTE) SARS-CoV-2 target nucleic acids are DETECTED.  The SARS-CoV-2 RNA is generally detectable in upper respiratory specimens during the acute phase of infection. Positive results are indicative of the presence of the identified virus, but do not rule out bacterial infection or co-infection with other pathogens not detected by the test. Clinical correlation with patient history and other diagnostic information is necessary to determine patient infection status. The expected result is Negative.  Fact Sheet for Patients: EntrepreneurPulse.com.au  Fact Sheet for Healthcare Providers: IncredibleEmployment.be  This test is not yet approved or cleared by the [place] FDA and  has been authorized for detection and/or diagnosis of SARS-CoV-2 by FDA under an Emergency Use Authorization (EUA).  This EUA will remain in effect (meaning this test  can be used) for the duration of  the COVID-19 declaration under Section 564(b)(1) of the Act, 21 U.S.C. section 360bbb-3(b)(1), unless the authorization is terminated or revoked sooner.  Influenza A by PCR NEGATIVE NEGATIVE Final   Influenza B by PCR NEGATIVE NEGATIVE Final    Comment: (NOTE) The Xpert Xpress SARS-CoV-2/FLU/RSV plus assay is intended as an aid in the diagnosis of influenza from Nasopharyngeal swab specimens and should not be used as a sole basis for treatment. Nasal washings and aspirates are unacceptable for Xpert Xpress SARS-CoV-2/FLU/RSV testing.  Fact Sheet for Patients: EntrepreneurPulse.com.au  Fact Sheet for Healthcare Providers: IncredibleEmployment.be  This test is not yet approved or cleared by the [place] FDA and has been authorized for detection and/or diagnosis of SARS-CoV-2 by FDA under an Emergency Use Authorization (EUA). This EUA will remain in effect (meaning this test can  be used) for the duration of the COVID-19 declaration under Section 564(b)(1) of the Act, 21 U.S.C. section 360bbb-3(b)(1), unless the authorization is terminated or revoked.  Performed at [facility], [address], [place], [place] [address]          Radiology Studies: [facility] 1 View  Result Date: 08/09/2021 CLINICAL DATA:  Shortness of breath. EXAM: PORTABLE CHEST 1 VIEW COMPARISON:  Chest x-ray 11/24/2020. CT heart 05/31/2021. FINDINGS: The heart is enlarged. There are diffuse reticular opacities throughout both lungs increased from prior. There is no pleural effusion or pneumothorax. Prominent pleural fat in the lower right hemithorax appears unchanged. Heart is enlarged, unchanged. No acute fractures are seen. There are postsurgical changes in the right shoulder. IMPRESSION: 1. Cardiomegaly. 2. Diffuse interstitial opacities worrisome for mild to moderate pulmonary edema. Infection is not excluded. Electronically Signed   By: Ronney Asters M.D.   On: 08/09/2021 18:08   ECHOCARDIOGRAM COMPLETE  Result Date: 08/10/2021    ECHOCARDIOGRAM REPORT   Patient Name:   Lucas Dudley Date of Exam: 08/10/2021 Medical Rec #:  [id number]     Height:       70.0 in Accession #:    [id number]    Weight:       256.0 lb Date of Birth:  [birth year]      BSA:          2.318 m Patient Age:    55 years      BP:           126/68 mmHg Patient Gender: M             HR:           82 bpm. Exam Location:  [facility] Procedure: 2D Echo, Cardiac Doppler and Color Doppler Indications:    CHF  History:        Patient has prior history of Echocardiogram examinations, most                 recent 05/10/2021. CHF, CAD, Arrythmias:Atrial Fibrillation,                 Signs/Symptoms:Shortness of Breath; Risk Factors:Hypertension,                 Diabetes and Dyslipidemia.  Sonographer:    Wenda Low Referring Phys: [id number] ASIA B [place]  Sonographer Comments: COVID + IMPRESSIONS  1. Left ventricular ejection  fraction, by estimation, is 60 to 65%. The left ventricle has normal function. Left ventricular endocardial border not optimally defined to evaluate regional wall motion. There is mild left ventricular hypertrophy. Left ventricular diastolic parameters are indeterminate.  2. Right ventricular systolic function is normal. The right ventricular size is normal. Tricuspid regurgitation signal is inadequate for assessing PA pressure.  3. The  mitral valve is abnormal. Mild mitral valve regurgitation. No evidence of mitral stenosis.  4. The aortic valve was not well visualized. There is mild calcification of the aortic valve. There is mild thickening of the aortic valve. Aortic valve regurgitation is not visualized. No aortic stenosis is present. FINDINGS  Left Ventricle: Left ventricular ejection fraction, by estimation, is 60 to 65%. The left ventricle has normal function. Left ventricular endocardial border not optimally defined to evaluate regional wall motion. The left ventricular internal cavity size was normal in size. There is mild left ventricular hypertrophy. Left ventricular diastolic parameters are indeterminate. Right Ventricle: The right ventricular size is normal. No increase in right ventricular wall thickness. Right ventricular systolic function is normal. Tricuspid regurgitation signal is inadequate for assessing PA pressure. Left Atrium: Left atrial size was normal in size. Right Atrium: Right atrial size was normal in size. Pericardium: There is no evidence of pericardial effusion. Mitral Valve: The mitral valve is abnormal. Mild mitral valve regurgitation. No evidence of mitral valve stenosis. MV peak gradient, 5.3 mmHg. The mean mitral valve gradient is 1.0 mmHg. Tricuspid Valve: The tricuspid valve is not well visualized. Tricuspid valve regurgitation is not demonstrated. No evidence of tricuspid stenosis. Aortic Valve: The aortic valve was not well visualized. There is mild calcification of the  aortic valve. There is mild thickening of the aortic valve. There is mild aortic valve annular calcification. Aortic valve regurgitation is not visualized. No aortic stenosis is present. Aortic valve mean gradient measures 3.5 mmHg. Aortic valve peak gradient measures 7.0 mmHg. Aortic valve area, by VTI measures 2.57 cm. Pulmonic Valve: The pulmonic valve was not well visualized. Pulmonic valve regurgitation is not visualized. No evidence of pulmonic stenosis. Aorta: The aortic root is normal in size and structure. Venous: The inferior vena cava was not well visualized. IAS/Shunts: The interatrial septum was not well visualized.  LEFT VENTRICLE PLAX 2D LVIDd:         5.10 cm   Diastology LVIDs:         2.80 cm   LV e' lateral:   13.20 cm/s LV PW:         1.20 cm   LV E/e' lateral: 7.7 LV IVS:        1.20 cm LVOT diam:     2.10 cm LV SV:         64 LV SV Index:   27 LVOT Area:     3.46 cm  RIGHT VENTRICLE RV Basal diam:  4.70 cm RV Mid diam:    3.80 cm RV S prime:     12.90 cm/s TAPSE (M-mode): 2.3 cm LEFT ATRIUM             Index        RIGHT ATRIUM           Index LA diam:        4.30 cm 1.85 cm/m   RA Area:     16.90 cm LA Vol (A2C):   76.8 ml 33.13 ml/m  RA Volume:   43.20 ml  18.64 ml/m LA Vol (A4C):   77.1 ml 33.26 ml/m LA Biplane Vol: 80.6 ml 34.77 ml/m  AORTIC VALVE                    PULMONIC VALVE AV Area (Vmax):    2.57 cm     PV Vmax:       0.80 m/s AV Area (Vmean):   2.17 cm  PV Peak grad:  2.5 mmHg AV Area (VTI):     2.57 cm AV Vmax:           132.00 cm/s AV Vmean:          91.450 cm/s AV VTI:            0.248 m AV Peak Grad:      7.0 mmHg AV Mean Grad:      3.5 mmHg LVOT Vmax:         97.90 cm/s LVOT Vmean:        57.300 cm/s LVOT VTI:          0.184 m LVOT/AV VTI ratio: 0.74  AORTA Ao Root diam: 3.30 cm MITRAL VALVE MV Area (PHT): 5.23 cm     SHUNTS MV Area VTI:   2.35 cm     Systemic VTI:  0.18 m MV Peak grad:  5.3 mmHg     Systemic Diam: 2.10 cm MV Mean grad:  1.0 mmHg MV Vmax:        1.15 m/s MV Vmean:      37.5 cm/s MV Decel Time: 145 msec MV E velocity: 101.00 cm/s Carlyle Dolly MD Electronically signed by Carlyle Dolly MD Signature Date/Time: 08/10/2021/2:54:24 PM    Final         Scheduled Meds:  amLODipine  10 mg Oral QHS   apixaban  5 mg Oral BID   vitamin C  500 mg Oral Daily   carvedilol  6.25 mg Oral BID WC   folic acid  1 mg Oral Daily   furosemide  40 mg Intravenous Q12H   insulin aspart  0-15 Units Subcutaneous TID WC   insulin aspart  0-5 Units Subcutaneous QHS   insulin detemir  25 Units Subcutaneous QHS   lisinopril  40 mg Oral Daily   melatonin  6 mg Oral QHS   molnupiravir EUA  4 capsule Oral BID   multivitamin with minerals  1 tablet Oral Daily   potassium chloride  40 mEq Oral Q4H   thiamine  100 mg Oral Daily   zinc sulfate  220 mg Oral Daily   Continuous Infusions:   LOS: 0 days    Time spent: 3mins    Kathie Dike, MD [facility]   If 7PM-7AM, please contact night-coverage [email or web address]  08/10/2021, 5:40 PM

## 2021-08-10 NOTE — ED Notes (Signed)
Pt had a restless night. Pt had to sit on the side of the bed to breathe better several times throughout the night. Pt wanted his CHG checked; pt stated "check my sugar, it feels like it's off" Pt cbg was 160

## 2021-08-10 NOTE — ED Notes (Signed)
Pt was SOB (O2 sat 88%), placed O2 [place] at 5 L. Pt more comfortable and O2 sat came up to 94%

## 2021-08-10 NOTE — Progress Notes (Signed)
*  PRELIMINARY RESULTS* Echocardiogram 2D Echocardiogram has been performed.  Lucas Dudley 08/10/2021, 2:20 PM

## 2021-08-11 DIAGNOSIS — J9601 Acute respiratory failure with hypoxia: Secondary | ICD-10-CM | POA: Diagnosis not present

## 2021-08-11 DIAGNOSIS — E119 Type 2 diabetes mellitus without complications: Secondary | ICD-10-CM | POA: Diagnosis not present

## 2021-08-11 DIAGNOSIS — I4819 Other persistent atrial fibrillation: Secondary | ICD-10-CM | POA: Diagnosis not present

## 2021-08-11 DIAGNOSIS — I10 Essential (primary) hypertension: Secondary | ICD-10-CM | POA: Diagnosis not present

## 2021-08-11 LAB — GLUCOSE, CAPILLARY
Glucose-Capillary: 105 mg/dL — ABNORMAL HIGH (ref 70–99)
Glucose-Capillary: 180 mg/dL — ABNORMAL HIGH (ref 70–99)
Glucose-Capillary: 217 mg/dL — ABNORMAL HIGH (ref 70–99)
Glucose-Capillary: 295 mg/dL — ABNORMAL HIGH (ref 70–99)

## 2021-08-11 LAB — COMPREHENSIVE METABOLIC PANEL
ALT: 16 U/L (ref 0–44)
AST: 34 U/L (ref 15–41)
Albumin: 3.5 g/dL (ref 3.5–5.0)
Alkaline Phosphatase: 49 U/L (ref 38–126)
Anion gap: 4 — ABNORMAL LOW (ref 5–15)
BUN: 22 mg/dL (ref 8–23)
CO2: 35 mmol/L — ABNORMAL HIGH (ref 22–32)
Calcium: 8.1 mg/dL — ABNORMAL LOW (ref 8.9–10.3)
Chloride: 96 mmol/L — ABNORMAL LOW (ref 98–111)
Creatinine, Ser: 0.89 mg/dL (ref 0.61–1.24)
GFR, Estimated: >60 mL/min (ref >60)
Glucose, Bld: 125 mg/dL — ABNORMAL HIGH (ref 70–99)
Potassium: 3.6 mmol/L (ref 3.5–5.1)
Sodium: 135 mmol/L (ref 135–145)
Total Bilirubin: 1.3 mg/dL — ABNORMAL HIGH (ref 0.3–1.2)
Total Protein: 6.9 g/dL (ref 6.5–8.1)

## 2021-08-11 LAB — D-DIMER, QUANTITATIVE: D-Dimer, Quant: 0.69 ug/mL-FEU — ABNORMAL HIGH (ref 0.00–0.50)

## 2021-08-11 LAB — C-REACTIVE PROTEIN: CRP: 9.4 mg/dL — ABNORMAL HIGH (ref <1.0)

## 2021-08-11 LAB — FERRITIN: Ferritin: 99 ng/mL (ref 24–336)

## 2021-08-11 LAB — MAGNESIUM: Magnesium: 1.9 mg/dL (ref 1.7–2.4)

## 2021-08-11 MED ORDER — GUAIFENESIN-DM 100-10 MG/5ML PO SYRP: 10.0000 mL | ORAL_SOLUTION | ORAL | Status: DC | PRN

## 2021-08-11 MED ORDER — PREDNISONE 20 MG PO TABS
50.0000 mg | ORAL_TABLET | Freq: Every day | ORAL | Status: DC
  Administered 2021-08-14 – 2021-08-15 (×2): 50 mg via ORAL
  Filled 2021-08-11: qty 2
  Filled 2021-08-11 (×2): qty 1

## 2021-08-11 MED ORDER — METHYLPREDNISOLONE SODIUM SUCC 125 MG IJ SOLR
0.5000 mg/kg | Freq: Two times a day (BID) | INTRAMUSCULAR | Status: AC
  Administered 2021-08-11 – 2021-08-14 (×6): 56.875 mg via INTRAVENOUS
  Filled 2021-08-11 (×6): qty 2

## 2021-08-11 MED ORDER — FUROSEMIDE 10 MG/ML IJ SOLN
40.0000 mg | Freq: Two times a day (BID) | INTRAMUSCULAR | Status: DC
  Administered 2021-08-11 – 2021-08-14 (×6): 40 mg via INTRAVENOUS
  Filled 2021-08-11 (×8): qty 4

## 2021-08-11 MED ORDER — HYDROCOD POLST-CPM POLST ER 10-8 MG/5ML PO SUER: 5.0000 mL | Freq: Two times a day (BID) | ORAL | Status: DC | PRN

## 2021-08-11 NOTE — Progress Notes (Signed)
PROGRESS NOTE    Lucas Dudley  UMP:[id number] DOB: December 06, 1940 DOA: 08/09/2021 PCP: [name], MD    Brief Narrative:  80 year old male who presented to the emergency room with shortness of breath.  He underwent cardioversion for atrial fibrillation on the same day of presentation.  He was noted to be in decompensated CHF.  Admitted for IV diuretics.  Also found to be incidentally positive for COVID.  Patient has been vaccinated.  He is requiring supplemental oxygen due to CHF exacerbation.   Assessment & Plan:   Active Problems:   Benign essential HTN   Diabetes mellitus with no complication (HCC)   OSA (obstructive sleep apnea)   A-fib (HCC)   CHF exacerbation (HCC)   Acute respiratory failure with hypoxia (HCC)   Acute on chronic diastolic congestive heart failure -Continues to have evidence of volume overload -Appears to be having good urine output with IV Lasix -He is already on beta-blocker and ACE inhibitor -Continue current treatments  Acute respiratory failure with hypoxia -He was noted to be hypoxic on room air and has been having significant orthopnea -He is not normally on any oxygen -Currently on 5 L of oxygen, will try to wean down to 3 L today  COVID-19 infection -He did test positive for COVID-19 -Started on molnupiravir -Has been refusing medication since he does feel that it is making his shortness of breath worse -I discussed the option of transitioning to remdesivir, but patient's daughter does not wish for him to receive this -His CRP has started to trend up and he is developed a productive cough -Since he has hypoxia requiring oxygen, will start on steroids -If respiratory status/hypoxia worsen, can consider baricitinib  Paroxysmal atrial fibrillation -Patient underwent cardioversion on 10/26 -Unfortunately, appears to be back in atrial fibrillation -Heart rate is controlled -Continue on Coreg -Anticoagulated with Eliquis  Obstructive sleep  apnea -Continue on CPAP  Hypokalemia/hypomagnesemia -Replace  Diabetes -Blood sugars currently stable -On sliding scale and Levemir   DVT prophylaxis: SCDs Start: 08/09/21 2013 apixaban (ELIQUIS) tablet 5 mg  Code Status: Full code Family Communication: Updated patient's daughter, [name] over the phone 10/28 Disposition Plan: Status is: Inpatient  Remains inpatient appropriate because: He needs continued IV diuresis         Consultants:    Procedures:  Echo with EF 60-65%  Antimicrobials:      Subjective: Starting to have productive cough, still short of breath  Objective: Vitals:   08/11/21 0841 08/11/21 1358 08/11/21 1613 08/11/21 1953  BP: 121/78 99/63 (!) 126/58 122/70  Pulse: 77 70  90  Resp: 20 18  19   Temp:  99.3 F (37.4 C)  98.3 F (36.8 C)  TempSrc:  Oral  Oral  SpO2: 100% 97%  97%  Weight: 114.3 kg     Height: 5\' 10"  (1.778 m)       Intake/Output Summary (Last 24 hours) at 08/11/2021 2004 Last data filed at 08/11/2021 1900 Gross per 24 hour  Intake 480 ml  Output 1700 ml  Net -1220 ml   Filed Weights   08/10/21 1831 08/11/21 0500 08/11/21 0841  Weight: 116.1 kg 116 kg 114.3 kg    Examination:  General exam: Appears calm and comfortable  Respiratory system: Scattered rhonchi respiratory effort normal. Cardiovascular system: S1 & S2 heard, irregular no JVD, murmurs, rubs, gallops or clicks.  Gastrointestinal system: Abdomen is nondistended, soft and nontender. No organomegaly or masses felt. Normal bowel sounds heard. Central nervous system: Alert and oriented.  No focal neurological deficits. Extremities: 2+ edema bilaterally Skin: No rashes, lesions or ulcers Psychiatry: Judgement and insight appear normal. Mood & affect appropriate.     Data Reviewed: I have personally reviewed following labs and imaging studies  CBC: Recent Labs  Lab 08/09/21 1016 08/09/21 1753 08/10/21 0343  WBC  --  7.5 5.8  NEUTROABS  --  6.5  --    HGB 12.6* 12.8* 11.2*  HCT 37.0* 40.9 36.5*  MCV  --  92.5 91.7  PLT  --  179 696   Basic Metabolic Panel: Recent Labs  Lab 08/09/21 1016 08/09/21 1753 08/10/21 0343 08/11/21 0536  NA 141 136 134* 135  K 3.9 3.6 3.0* 3.6  CL 102 98 94* 96*  CO2  --  28 29 35*  GLUCOSE 97 152* 177* 125*  BUN 21 23 21 22   CREATININE 1.10 1.09 0.98 0.89  CALCIUM  --  9.0 8.5* 8.1*  MG  --   --  1.5* 1.9   GFR: Estimated Creatinine Clearance: 86.6 mL/min (by C-G formula based on SCr of 0.89 mg/dL). Liver Function Tests: Recent Labs  Lab 08/09/21 1753 08/10/21 0343 08/11/21 0536  AST 20 18 34  ALT 16 14 16   ALKPHOS 60 51 49  BILITOT 1.3* 1.5* 1.3*  PROT 8.6* 7.5 6.9  ALBUMIN 4.3 4.0 3.5   No results for input(s): LIPASE, AMYLASE in the last 168 hours. No results for input(s): AMMONIA in the last 168 hours. Coagulation Profile: No results for input(s): INR, PROTIME in the last 168 hours. Cardiac Enzymes: No results for input(s): CKTOTAL, CKMB, CKMBINDEX, TROPONINI in the last 168 hours. BNP (last 3 results) No results for input(s): PROBNP in the last 8760 hours. HbA1C: Recent Labs    08/10/21 0343  HGBA1C 7.8*   CBG: Recent Labs  Lab 08/10/21 1624 08/10/21 2037 08/11/21 0801 08/11/21 1112 08/11/21 1609  GLUCAP 109* 168* 105* 180* 217*   Lipid Profile: No results for input(s): CHOL, HDL, LDLCALC, TRIG, CHOLHDL, LDLDIRECT in the last 72 hours. Thyroid Function Tests: Recent Labs    08/10/21 0343  TSH 0.932   Anemia Panel: Recent Labs    08/11/21 0536  FERRITIN 99   Sepsis Labs: No results for input(s): PROCALCITON, LATICACIDVEN in the last 168 hours.  Recent Results (from the past 240 hour(s))  Resp Panel by RT-PCR (Flu A&B, Covid) Nasopharyngeal Swab     Status: Abnormal   Collection Time: 08/09/21  7:39 PM   Specimen: Nasopharyngeal Swab; Nasopharyngeal(NP) swabs in vial transport medium  Result Value Ref Range Status   SARS Coronavirus 2 by RT PCR  POSITIVE (A) NEGATIVE Final    Comment: RESULT CALLED TO, READ BACK BY AND VERIFIED WITH: Nichols,K@2116   by Matthews,B 10.26.2022 (NOTE) SARS-CoV-2 target nucleic acids are DETECTED.  The SARS-CoV-2 RNA is generally detectable in upper respiratory specimens during the acute phase of infection. Positive results are indicative of the presence of the identified virus, but do not rule out bacterial infection or co-infection with other pathogens not detected by the test. Clinical correlation with patient history and other diagnostic information is necessary to determine patient infection status. The expected result is Negative.  Fact Sheet for Patients: EntrepreneurPulse.com.au  Fact Sheet for Healthcare Providers: IncredibleEmployment.be  This test is not yet approved or cleared by the [place] FDA and  has been authorized for detection and/or diagnosis of SARS-CoV-2 by FDA under an Emergency Use Authorization (EUA).  This EUA will remain in effect (meaning this  test  can be used) for the duration of  the COVID-19 declaration under Section 564(b)(1) of the Act, 21 U.S.C. section 360bbb-3(b)(1), unless the authorization is terminated or revoked sooner.     Influenza A by PCR NEGATIVE NEGATIVE Final   Influenza B by PCR NEGATIVE NEGATIVE Final    Comment: (NOTE) The Xpert Xpress SARS-CoV-2/FLU/RSV plus assay is intended as an aid in the diagnosis of influenza from Nasopharyngeal swab specimens and should not be used as a sole basis for treatment. Nasal washings and aspirates are unacceptable for Xpert Xpress SARS-CoV-2/FLU/RSV testing.  Fact Sheet for Patients: EntrepreneurPulse.com.au  Fact Sheet for Healthcare Providers: IncredibleEmployment.be  This test is not yet approved or cleared by the [place] FDA and has been authorized for detection and/or diagnosis of SARS-CoV-2 by FDA under an  Emergency Use Authorization (EUA). This EUA will remain in effect (meaning this test can be used) for the duration of the COVID-19 declaration under Section 564(b)(1) of the Act, 21 U.S.C. section 360bbb-3(b)(1), unless the authorization is terminated or revoked.  Performed at [facility], [address], [place], [place] [address]          Radiology Studies: ECHOCARDIOGRAM COMPLETE  Result Date: 08/10/2021    ECHOCARDIOGRAM REPORT   Patient Name:   Lucas Dudley Date of Exam: 08/10/2021 Medical Rec #:  [id number]     Height:       70.0 in Accession #:    [id number]    Weight:       256.0 lb Date of Birth:  Apr 03, 1941      BSA:          2.318 m Patient Age:    89 years      BP:           126/68 mmHg Patient Gender: M             HR:           82 bpm. Exam Location:  [facility] Procedure: 2D Echo, Cardiac Doppler and Color Doppler Indications:    CHF  History:        Patient has prior history of Echocardiogram examinations, most                 recent 05/10/2021. CHF, CAD, Arrythmias:Atrial Fibrillation,                 Signs/Symptoms:Shortness of Breath; Risk Factors:Hypertension,                 Diabetes and Dyslipidemia.  Sonographer:    Wenda Low Referring Phys: [id number] ASIA B Santiago  Sonographer Comments: COVID + IMPRESSIONS  1. Left ventricular ejection fraction, by estimation, is 60 to 65%. The left ventricle has normal function. Left ventricular endocardial border not optimally defined to evaluate regional wall motion. There is mild left ventricular hypertrophy. Left ventricular diastolic parameters are indeterminate.  2. Right ventricular systolic function is normal. The right ventricular size is normal. Tricuspid regurgitation signal is inadequate for assessing PA pressure.  3. The mitral valve is abnormal. Mild mitral valve regurgitation. No evidence of mitral stenosis.  4. The aortic valve was not well visualized. There is mild calcification of the aortic valve. There is mild  thickening of the aortic valve. Aortic valve regurgitation is not visualized. No aortic stenosis is present. FINDINGS  Left Ventricle: Left ventricular ejection fraction, by estimation, is 60 to 65%. The left ventricle has normal function. Left ventricular endocardial border not optimally defined to  evaluate regional wall motion. The left ventricular internal cavity size was normal in size. There is mild left ventricular hypertrophy. Left ventricular diastolic parameters are indeterminate. Right Ventricle: The right ventricular size is normal. No increase in right ventricular wall thickness. Right ventricular systolic function is normal. Tricuspid regurgitation signal is inadequate for assessing PA pressure. Left Atrium: Left atrial size was normal in size. Right Atrium: Right atrial size was normal in size. Pericardium: There is no evidence of pericardial effusion. Mitral Valve: The mitral valve is abnormal. Mild mitral valve regurgitation. No evidence of mitral valve stenosis. MV peak gradient, 5.3 mmHg. The mean mitral valve gradient is 1.0 mmHg. Tricuspid Valve: The tricuspid valve is not well visualized. Tricuspid valve regurgitation is not demonstrated. No evidence of tricuspid stenosis. Aortic Valve: The aortic valve was not well visualized. There is mild calcification of the aortic valve. There is mild thickening of the aortic valve. There is mild aortic valve annular calcification. Aortic valve regurgitation is not visualized. No aortic stenosis is present. Aortic valve mean gradient measures 3.5 mmHg. Aortic valve peak gradient measures 7.0 mmHg. Aortic valve area, by VTI measures 2.57 cm. Pulmonic Valve: The pulmonic valve was not well visualized. Pulmonic valve regurgitation is not visualized. No evidence of pulmonic stenosis. Aorta: The aortic root is normal in size and structure. Venous: The inferior vena cava was not well visualized. IAS/Shunts: The interatrial septum was not well visualized.  LEFT  VENTRICLE PLAX 2D LVIDd:         5.10 cm   Diastology LVIDs:         2.80 cm   LV e' lateral:   13.20 cm/s LV PW:         1.20 cm   LV E/e' lateral: 7.7 LV IVS:        1.20 cm LVOT diam:     2.10 cm LV SV:         64 LV SV Index:   27 LVOT Area:     3.46 cm  RIGHT VENTRICLE RV Basal diam:  4.70 cm RV Mid diam:    3.80 cm RV S prime:     12.90 cm/s TAPSE (M-mode): 2.3 cm LEFT ATRIUM             Index        RIGHT ATRIUM           Index LA diam:        4.30 cm 1.85 cm/m   RA Area:     16.90 cm LA Vol (A2C):   76.8 ml 33.13 ml/m  RA Volume:   43.20 ml  18.64 ml/m LA Vol (A4C):   77.1 ml 33.26 ml/m LA Biplane Vol: 80.6 ml 34.77 ml/m  AORTIC VALVE                    PULMONIC VALVE AV Area (Vmax):    2.57 cm     PV Vmax:       0.80 m/s AV Area (Vmean):   2.17 cm     PV Peak grad:  2.5 mmHg AV Area (VTI):     2.57 cm AV Vmax:           132.00 cm/s AV Vmean:          91.450 cm/s AV VTI:            0.248 m AV Peak Grad:      7.0 mmHg AV Mean Grad:  3.5 mmHg LVOT Vmax:         97.90 cm/s LVOT Vmean:        57.300 cm/s LVOT VTI:          0.184 m LVOT/AV VTI ratio: 0.74  AORTA Ao Root diam: 3.30 cm MITRAL VALVE MV Area (PHT): 5.23 cm     SHUNTS MV Area VTI:   2.35 cm     Systemic VTI:  0.18 m MV Peak grad:  5.3 mmHg     Systemic Diam: 2.10 cm MV Mean grad:  1.0 mmHg MV Vmax:       1.15 m/s MV Vmean:      37.5 cm/s MV Decel Time: 145 msec MV E velocity: 101.00 cm/s Carlyle Dolly MD Electronically signed by Carlyle Dolly MD Signature Date/Time: 08/10/2021/2:54:24 PM    Final         Scheduled Meds:  amLODipine  10 mg Oral QHS   apixaban  5 mg Oral BID   vitamin C  500 mg Oral Daily   carvedilol  6.25 mg Oral BID WC   folic acid  1 mg Oral Daily   furosemide  40 mg Intravenous BID   insulin aspart  0-15 Units Subcutaneous TID WC   insulin aspart  0-5 Units Subcutaneous QHS   insulin detemir  25 Units Subcutaneous QHS   lisinopril  40 mg Oral Daily   mouth rinse  15 mL Mouth Rinse BID    melatonin  6 mg Oral QHS   methylPREDNISolone (SOLU-MEDROL) injection  0.5 mg/kg Intravenous Q12H   Followed by   Derrill Memo ON 08/14/2021] predniSONE  50 mg Oral Daily   molnupiravir EUA  4 capsule Oral BID   multivitamin with minerals  1 tablet Oral Daily   thiamine  100 mg Oral Daily   zinc sulfate  220 mg Oral Daily   Continuous Infusions:   LOS: 1 day    Time spent: 41mins    Kathie Dike, MD [facility]   If 7PM-7AM, please contact night-coverage [email or web address]  08/11/2021, 8:04 PM

## 2021-08-12 DIAGNOSIS — J9601 Acute respiratory failure with hypoxia: Secondary | ICD-10-CM | POA: Diagnosis not present

## 2021-08-12 DIAGNOSIS — I10 Essential (primary) hypertension: Secondary | ICD-10-CM | POA: Diagnosis not present

## 2021-08-12 DIAGNOSIS — E119 Type 2 diabetes mellitus without complications: Secondary | ICD-10-CM | POA: Diagnosis not present

## 2021-08-12 DIAGNOSIS — I4819 Other persistent atrial fibrillation: Secondary | ICD-10-CM | POA: Diagnosis not present

## 2021-08-12 LAB — COMPREHENSIVE METABOLIC PANEL
ALT: 20 U/L (ref 0–44)
AST: 30 U/L (ref 15–41)
Albumin: 3.3 g/dL — ABNORMAL LOW (ref 3.5–5.0)
Alkaline Phosphatase: 50 U/L (ref 38–126)
Anion gap: 8 (ref 5–15)
BUN: 26 mg/dL — ABNORMAL HIGH (ref 8–23)
CO2: 32 mmol/L (ref 22–32)
Calcium: 8.2 mg/dL — ABNORMAL LOW (ref 8.9–10.3)
Chloride: 94 mmol/L — ABNORMAL LOW (ref 98–111)
Creatinine, Ser: 0.92 mg/dL (ref 0.61–1.24)
GFR, Estimated: >60 mL/min (ref >60)
Glucose, Bld: 239 mg/dL — ABNORMAL HIGH (ref 70–99)
Potassium: 3.6 mmol/L (ref 3.5–5.1)
Sodium: 134 mmol/L — ABNORMAL LOW (ref 135–145)
Total Bilirubin: 1.1 mg/dL (ref 0.3–1.2)
Total Protein: 6.6 g/dL (ref 6.5–8.1)

## 2021-08-12 LAB — GLUCOSE, CAPILLARY
Glucose-Capillary: 206 mg/dL — ABNORMAL HIGH (ref 70–99)
Glucose-Capillary: 308 mg/dL — ABNORMAL HIGH (ref 70–99)
Glucose-Capillary: 332 mg/dL — ABNORMAL HIGH (ref 70–99)
Glucose-Capillary: 306 mg/dL — ABNORMAL HIGH (ref 70–99)

## 2021-08-12 LAB — C-REACTIVE PROTEIN: CRP: 6.5 mg/dL — ABNORMAL HIGH (ref <1.0)

## 2021-08-12 LAB — FERRITIN: Ferritin: 112 ng/mL (ref 24–336)

## 2021-08-12 LAB — D-DIMER, QUANTITATIVE: D-Dimer, Quant: 0.49 ug/mL-FEU (ref 0.00–0.50)

## 2021-08-12 LAB — MAGNESIUM: Magnesium: 1.9 mg/dL (ref 1.7–2.4)

## 2021-08-12 MED ORDER — INSULIN DETEMIR 100 UNIT/ML ~~LOC~~ SOLN
30.0000 [IU] | Freq: Every day | SUBCUTANEOUS | Status: DC
  Filled 2021-08-12 (×2): qty 0.3

## 2021-08-12 MED ORDER — DOCUSATE SODIUM 100 MG PO CAPS
100.0000 mg | ORAL_CAPSULE | Freq: Two times a day (BID) | ORAL | Status: DC
  Administered 2021-08-12 – 2021-08-13 (×3): 100 mg via ORAL
  Filled 2021-08-12 (×3): qty 1

## 2021-08-12 MED ORDER — INSULIN ASPART 100 UNIT/ML IJ SOLN
0.0000 [IU] | Freq: Three times a day (TID) | INTRAMUSCULAR | Status: DC
  Administered 2021-08-13: 15 [IU] via SUBCUTANEOUS
  Administered 2021-08-13: 4 [IU] via SUBCUTANEOUS
  Administered 2021-08-13: 7 [IU] via SUBCUTANEOUS
  Administered 2021-08-14: 11 [IU] via SUBCUTANEOUS
  Administered 2021-08-14: 4 [IU] via SUBCUTANEOUS
  Administered 2021-08-14: 11 [IU] via SUBCUTANEOUS
  Administered 2021-08-15: 3 [IU] via SUBCUTANEOUS

## 2021-08-12 MED ORDER — INSULIN ASPART 100 UNIT/ML IJ SOLN
0.0000 [IU] | Freq: Every day | INTRAMUSCULAR | Status: DC
  Administered 2021-08-12: 4 [IU] via SUBCUTANEOUS
  Administered 2021-08-13: 3 [IU] via SUBCUTANEOUS
  Administered 2021-08-14: 4 [IU] via SUBCUTANEOUS

## 2021-08-12 MED ORDER — POTASSIUM CHLORIDE CRYS ER 20 MEQ PO TBCR
40.0000 meq | EXTENDED_RELEASE_TABLET | ORAL | Status: AC
  Administered 2021-08-12 (×2): 40 meq via ORAL
  Filled 2021-08-12 (×2): qty 2

## 2021-08-12 MED ORDER — INSULIN ASPART 100 UNIT/ML IJ SOLN
5.0000 [IU] | Freq: Three times a day (TID) | INTRAMUSCULAR | Status: DC
  Administered 2021-08-13 – 2021-08-15 (×7): 5 [IU] via SUBCUTANEOUS

## 2021-08-12 MED ORDER — INSULIN DETEMIR 100 UNIT/ML ~~LOC~~ SOLN
35.0000 [IU] | Freq: Every day | SUBCUTANEOUS | Status: DC
  Administered 2021-08-12 – 2021-08-14 (×3): 35 [IU] via SUBCUTANEOUS
  Filled 2021-08-12 (×4): qty 0.35

## 2021-08-12 MED ORDER — MAGNESIUM SULFATE 2 GM/50ML IV SOLN
2.0000 g | Freq: Once | INTRAVENOUS | Status: AC
  Administered 2021-08-12: 2 g via INTRAVENOUS
  Filled 2021-08-12: qty 50

## 2021-08-12 MED ORDER — BISACODYL 5 MG PO TBEC
10.0000 mg | DELAYED_RELEASE_TABLET | Freq: Once | ORAL | Status: AC
  Administered 2021-08-12: 10 mg via ORAL
  Filled 2021-08-12: qty 2

## 2021-08-12 NOTE — Progress Notes (Addendum)
PROGRESS NOTE    Lucas Dudley  BOF:[id number] DOB: 06-Dec-1940 DOA: 08/09/2021 PCP: [name], MD    Brief Narrative:  80 year old male who presented to the emergency room with shortness of breath.  He underwent cardioversion for atrial fibrillation on the same day of presentation.  He was noted to be in decompensated CHF.  Admitted for IV diuretics.  Also found to be incidentally positive for COVID.  Patient has been vaccinated.  He is requiring supplemental oxygen due to CHF exacerbation.   Assessment & Plan:   Active Problems:   Benign essential HTN   Diabetes mellitus with no complication (HCC)   OSA (obstructive sleep apnea)   A-fib (HCC)   CHF exacerbation (HCC)   Acute respiratory failure with hypoxia (HCC)   Acute on chronic diastolic congestive heart failure -Continues to have evidence of volume overload -Appears to be having good urine output with IV Lasix -Net volume status -4.6 L today -He is already on beta-blocker and ACE inhibitor -Continue current treatments  Acute respiratory failure with hypoxia -He was noted to be hypoxic on room air and has been having significant orthopnea -He is not normally on any oxygen -Currently on 3 L of oxygen, will try to wean off oxygen as tolerated  COVID-19 infection -He did test positive for COVID-19 -Started on molnupiravir -Has been refusing medication since he does feel that it is making his shortness of breath worse -Started on steroids due to rising inflammatory markers -CRP now trending down and appears to be clinically improving -If respiratory status/hypoxia worsen, can consider baricitinib  Paroxysmal atrial fibrillation -Patient underwent cardioversion on 10/26 -Unfortunately, appears to be back in atrial fibrillation -Heart rate is controlled -Continue on Coreg -Anticoagulated with Eliquis  Obstructive sleep apnea -Continue on CPAP  Hypokalemia/hypomagnesemia -Replace  Diabetes type II,  uncontrolled with hyperglycemia -Blood sugars elevated in the setting of steroids -On sliding scale and Levemir   DVT prophylaxis: SCDs Start: 08/09/21 2013 apixaban (ELIQUIS) tablet 5 mg  Code Status: Full code Family Communication: Updated patient's daughter, [name] over the phone 10/28 Disposition Plan: Status is: Inpatient  Remains inpatient appropriate because: He needs continued IV diuresis         Consultants:    Procedures:  Echo with EF 60-65%  Antimicrobials:      Subjective: Continues to have productive cough.  Overall feels her breathing is improving.  Objective: Vitals:   08/12/21 0440 08/12/21 0454 08/12/21 1002 08/12/21 1518  BP: 130/79  127/70 (!) 148/82  Pulse: 85   68  Resp: 18   20  Temp: 97.9 F (36.6 C)   97.9 F (36.6 C)  TempSrc: Axillary   Oral  SpO2: 98%   100%  Weight:  114 kg    Height:        Intake/Output Summary (Last 24 hours) at 08/12/2021 1924 Last data filed at 08/12/2021 1811 Gross per 24 hour  Intake 606.07 ml  Output 2450 ml  Net -1843.93 ml   Filed Weights   08/11/21 0500 08/11/21 0841 08/12/21 0454  Weight: 116 kg 114.3 kg 114 kg    Examination:  General exam: Appears calm and comfortable  Respiratory system: Scattered rhonchi respiratory effort normal. Cardiovascular system: S1 & S2 heard, irregular no JVD, murmurs, rubs, gallops or clicks.  Gastrointestinal system: Abdomen is nondistended, soft and nontender. No organomegaly or masses felt. Normal bowel sounds heard. Central nervous system: Alert and oriented. No focal neurological deficits. Extremities: 2+ edema bilaterally Skin: No rashes,  lesions or ulcers Psychiatry: Judgement and insight appear normal. Mood & affect appropriate.     Data Reviewed: I have personally reviewed following labs and imaging studies  CBC: Recent Labs  Lab 08/09/21 1016 08/09/21 1753 08/10/21 0343  WBC  --  7.5 5.8  NEUTROABS  --  6.5  --   HGB 12.6* 12.8* 11.2*   HCT 37.0* 40.9 36.5*  MCV  --  92.5 91.7  PLT  --  179 962   Basic Metabolic Panel: Recent Labs  Lab 08/09/21 1016 08/09/21 1753 08/10/21 0343 08/11/21 0536 08/12/21 0422  NA 141 136 134* 135 134*  K 3.9 3.6 3.0* 3.6 3.6  CL 102 98 94* 96* 94*  CO2  --  28 29 35* 32  GLUCOSE 97 152* 177* 125* 239*  BUN 21 23 21 22  26*  CREATININE 1.10 1.09 0.98 0.89 0.92  CALCIUM  --  9.0 8.5* 8.1* 8.2*  MG  --   --  1.5* 1.9 1.9   GFR: Estimated Creatinine Clearance: 83.7 mL/min (by C-G formula based on SCr of 0.92 mg/dL). Liver Function Tests: Recent Labs  Lab 08/09/21 1753 08/10/21 0343 08/11/21 0536 08/12/21 0422  AST 20 18 34 30  ALT 16 14 16 20   ALKPHOS 60 51 49 50  BILITOT 1.3* 1.5* 1.3* 1.1  PROT 8.6* 7.5 6.9 6.6  ALBUMIN 4.3 4.0 3.5 3.3*   No results for input(s): LIPASE, AMYLASE in the last 168 hours. No results for input(s): AMMONIA in the last 168 hours. Coagulation Profile: No results for input(s): INR, PROTIME in the last 168 hours. Cardiac Enzymes: No results for input(s): CKTOTAL, CKMB, CKMBINDEX, TROPONINI in the last 168 hours. BNP (last 3 results) No results for input(s): PROBNP in the last 8760 hours. HbA1C: Recent Labs    08/10/21 0343  HGBA1C 7.8*   CBG: Recent Labs  Lab 08/11/21 1609 08/11/21 2056 08/12/21 0840 08/12/21 1135 08/12/21 1548  GLUCAP 217* 295* 206* 308* 332*   Lipid Profile: No results for input(s): CHOL, HDL, LDLCALC, TRIG, CHOLHDL, LDLDIRECT in the last 72 hours. Thyroid Function Tests: Recent Labs    08/10/21 0343  TSH 0.932   Anemia Panel: Recent Labs    08/11/21 0536 08/12/21 0422  FERRITIN 99 112   Sepsis Labs: No results for input(s): PROCALCITON, LATICACIDVEN in the last 168 hours.  Recent Results (from the past 240 hour(s))  Resp Panel by RT-PCR (Flu A&B, Covid) Nasopharyngeal Swab     Status: Abnormal   Collection Time: 08/09/21  7:39 PM   Specimen: Nasopharyngeal Swab; Nasopharyngeal(NP) swabs in vial  transport medium  Result Value Ref Range Status   SARS Coronavirus 2 by RT PCR POSITIVE (A) NEGATIVE Final    Comment: RESULT CALLED TO, READ BACK BY AND VERIFIED WITH: Nichols,K@2116   by Matthews,B 10.26.2022 (NOTE) SARS-CoV-2 target nucleic acids are DETECTED.  The SARS-CoV-2 RNA is generally detectable in upper respiratory specimens during the acute phase of infection. Positive results are indicative of the presence of the identified virus, but do not rule out bacterial infection or co-infection with other pathogens not detected by the test. Clinical correlation with patient history and other diagnostic information is necessary to determine patient infection status. The expected result is Negative.  Fact Sheet for Patients: EntrepreneurPulse.com.au  Fact Sheet for Healthcare Providers: IncredibleEmployment.be  This test is not yet approved or cleared by the [place] FDA and  has been authorized for detection and/or diagnosis of SARS-CoV-2 by FDA under an Emergency Use  Authorization (EUA).  This EUA will remain in effect (meaning this test  can be used) for the duration of  the COVID-19 declaration under Section 564(b)(1) of the Act, 21 U.S.C. section 360bbb-3(b)(1), unless the authorization is terminated or revoked sooner.     Influenza A by PCR NEGATIVE NEGATIVE Final   Influenza B by PCR NEGATIVE NEGATIVE Final    Comment: (NOTE) The Xpert Xpress SARS-CoV-2/FLU/RSV plus assay is intended as an aid in the diagnosis of influenza from Nasopharyngeal swab specimens and should not be used as a sole basis for treatment. Nasal washings and aspirates are unacceptable for Xpert Xpress SARS-CoV-2/FLU/RSV testing.  Fact Sheet for Patients: EntrepreneurPulse.com.au  Fact Sheet for Healthcare Providers: IncredibleEmployment.be  This test is not yet approved or cleared by the [place] FDA and has  been authorized for detection and/or diagnosis of SARS-CoV-2 by FDA under an Emergency Use Authorization (EUA). This EUA will remain in effect (meaning this test can be used) for the duration of the COVID-19 declaration under Section 564(b)(1) of the Act, 21 U.S.C. section 360bbb-3(b)(1), unless the authorization is terminated or revoked.  Performed at [facility], [address] [place], [place] [address]          Radiology Studies: No results found.      Scheduled Meds:  amLODipine  10 mg Oral QHS   apixaban  5 mg Oral BID   vitamin C  500 mg Oral Daily   carvedilol  6.25 mg Oral BID WC   docusate sodium  100 mg Oral BID   folic acid  1 mg Oral Daily   furosemide  40 mg Intravenous BID   [START ON 08/13/2021] insulin aspart  0-20 Units Subcutaneous TID WC   insulin aspart  0-5 Units Subcutaneous QHS   [START ON 08/13/2021] insulin aspart  5 Units Subcutaneous TID WC   insulin detemir  35 Units Subcutaneous QHS   lisinopril  40 mg Oral Daily   mouth rinse  15 mL Mouth Rinse BID   melatonin  6 mg Oral QHS   methylPREDNISolone (SOLU-MEDROL) injection  0.5 mg/kg Intravenous Q12H   Followed by   Derrill Memo ON 08/14/2021] predniSONE  50 mg Oral Daily   multivitamin with minerals  1 tablet Oral Daily   potassium chloride  40 mEq Oral Q3H   thiamine  100 mg Oral Daily   zinc sulfate  220 mg Oral Daily   Continuous Infusions:   LOS: 2 days    Time spent: 78mins    Kathie Dike, MD [facility]   If 7PM-7AM, please contact night-coverage [email or web address]  08/12/2021, 7:24 PM

## 2021-08-12 NOTE — Progress Notes (Signed)
Tele called patient had a 2.01 pause. Patient reports no complaints of dizziness or being lightheaded. Patient had just awaken from a nap. MD Memon aware.

## 2021-08-12 NOTE — Plan of Care (Signed)

## 2021-08-13 DIAGNOSIS — I10 Essential (primary) hypertension: Secondary | ICD-10-CM | POA: Diagnosis not present

## 2021-08-13 DIAGNOSIS — I4819 Other persistent atrial fibrillation: Secondary | ICD-10-CM | POA: Diagnosis not present

## 2021-08-13 DIAGNOSIS — J9601 Acute respiratory failure with hypoxia: Secondary | ICD-10-CM | POA: Diagnosis not present

## 2021-08-13 DIAGNOSIS — E119 Type 2 diabetes mellitus without complications: Secondary | ICD-10-CM | POA: Diagnosis not present

## 2021-08-13 LAB — C-REACTIVE PROTEIN: CRP: 3.2 mg/dL — ABNORMAL HIGH (ref <1.0)

## 2021-08-13 LAB — COMPREHENSIVE METABOLIC PANEL
ALT: 20 U/L (ref 0–44)
AST: 26 U/L (ref 15–41)
Albumin: 3.4 g/dL — ABNORMAL LOW (ref 3.5–5.0)
Alkaline Phosphatase: 47 U/L (ref 38–126)
Anion gap: 6 (ref 5–15)
BUN: 28 mg/dL — ABNORMAL HIGH (ref 8–23)
CO2: 33 mmol/L — ABNORMAL HIGH (ref 22–32)
Calcium: 8.6 mg/dL — ABNORMAL LOW (ref 8.9–10.3)
Chloride: 96 mmol/L — ABNORMAL LOW (ref 98–111)
Creatinine, Ser: 0.75 mg/dL (ref 0.61–1.24)
GFR, Estimated: >60 mL/min (ref >60)
Glucose, Bld: 209 mg/dL — ABNORMAL HIGH (ref 70–99)
Potassium: 4.2 mmol/L (ref 3.5–5.1)
Sodium: 135 mmol/L (ref 135–145)
Total Bilirubin: 0.7 mg/dL (ref 0.3–1.2)
Total Protein: 6.6 g/dL (ref 6.5–8.1)

## 2021-08-13 LAB — MAGNESIUM: Magnesium: 2 mg/dL (ref 1.7–2.4)

## 2021-08-13 LAB — GLUCOSE, CAPILLARY
Glucose-Capillary: 199 mg/dL — ABNORMAL HIGH (ref 70–99)
Glucose-Capillary: 303 mg/dL — ABNORMAL HIGH (ref 70–99)
Glucose-Capillary: 235 mg/dL — ABNORMAL HIGH (ref 70–99)
Glucose-Capillary: 263 mg/dL — ABNORMAL HIGH (ref 70–99)

## 2021-08-13 LAB — FERRITIN: Ferritin: 127 ng/mL (ref 24–336)

## 2021-08-13 LAB — D-DIMER, QUANTITATIVE: D-Dimer, Quant: 0.32 ug/mL-FEU (ref 0.00–0.50)

## 2021-08-13 MED ORDER — DORZOLAMIDE HCL-TIMOLOL MAL 2-0.5 % OP SOLN
1.0000 [drp] | Freq: Every day | OPHTHALMIC | Status: DC
  Administered 2021-08-13 – 2021-08-14 (×2): 1 [drp] via OPHTHALMIC
  Filled 2021-08-13: qty 10

## 2021-08-13 MED ORDER — DOCUSATE SODIUM 100 MG PO CAPS: 100.0000 mg | ORAL_CAPSULE | Freq: Two times a day (BID) | ORAL | Status: DC | PRN

## 2021-08-13 MED ORDER — GEMFIBROZIL 600 MG PO TABS
600.0000 mg | ORAL_TABLET | Freq: Every day | ORAL | Status: DC
  Administered 2021-08-13 – 2021-08-15 (×3): 600 mg via ORAL
  Filled 2021-08-13 (×3): qty 1

## 2021-08-13 MED ORDER — PREDNISOLONE ACETATE 1 % OP SUSP
1.0000 [drp] | Freq: Every day | OPHTHALMIC | Status: DC
  Filled 2021-08-13: qty 5
  Filled 2021-08-13: qty 1

## 2021-08-13 MED ORDER — VALACYCLOVIR HCL 500 MG PO TABS
1000.0000 mg | ORAL_TABLET | Freq: Every day | ORAL | Status: DC
  Administered 2021-08-13 – 2021-08-14 (×2): 1000 mg via ORAL
  Filled 2021-08-13 (×2): qty 2

## 2021-08-13 NOTE — Progress Notes (Signed)
PROGRESS NOTE    Lucas Dudley  GNF:[id number] DOB: 09-04-1941 DOA: 08/09/2021 PCP: [name], MD    Brief Narrative:  80 year old male who presented to the emergency room with shortness of breath.  He underwent cardioversion for atrial fibrillation on the same day of presentation.  He was noted to be in decompensated CHF.  Admitted for IV diuretics.  Also found to be incidentally positive for COVID.  Patient has been vaccinated.  He is requiring supplemental oxygen due to CHF exacerbation.   Assessment & Plan:   Active Problems:   Benign essential HTN   Diabetes mellitus with no complication (HCC)   OSA (obstructive sleep apnea)   A-fib (HCC)   CHF exacerbation (HCC)   Acute respiratory failure with hypoxia (HCC)   Acute on chronic diastolic congestive heart failure -Continues to have evidence of volume overload -Appears to be having good urine output with IV Lasix -Net volume status -5.4 L today -He is already on beta-blocker and ACE inhibitor -Continue current treatments  Acute respiratory failure with hypoxia -He was noted to be hypoxic on room air and has been having significant orthopnea -He is not normally on any oxygen -Was previously requiring up to 5 L of oxygen -Now has been weaned down to room air  COVID-19 infection -He did test positive for COVID-19 -Started on molnupiravir -Has been refusing medication since he does feel that it is making his shortness of breath worse -Started on steroids due to rising inflammatory markers -CRP now trending down and appears to be clinically improving -If respiratory status/hypoxia worsen, can consider baricitinib  Paroxysmal atrial fibrillation -Patient underwent cardioversion on 10/26 -Unfortunately, appears to be back in atrial fibrillation -Heart rate is controlled -Continue on Coreg -Anticoagulated with Eliquis  Obstructive sleep apnea -Continue on CPAP  Hypokalemia/hypomagnesemia -Replace  Diabetes  type II, uncontrolled with hyperglycemia -Blood sugars elevated in the setting of steroids -On sliding scale and Levemir   DVT prophylaxis: SCDs Start: 08/09/21 2013 apixaban (ELIQUIS) tablet 5 mg  Code Status: Full code Family Communication: Updated patient's daughter, Lucas Dudley over the phone 10/28 Disposition Plan: Status is: Inpatient  Remains inpatient appropriate because: He needs continued IV diuresis         Consultants:    Procedures:  Echo with EF 60-65%  Antimicrobials:      Subjective: Continues to have nonproductive cough.  Reports good urine output.  Objective: Vitals:   08/13/21 0542 08/13/21 0914 08/13/21 1532 08/13/21 2027  BP: 108/81 133/79 (!) 147/66 135/78  Pulse: (!) 51  92 84  Resp: 20  20 20   Temp: 98.4 F (36.9 C)  98.1 F (36.7 C) 98.2 F (36.8 C)  TempSrc: Oral  Oral   SpO2: 98%  95% 97%  Weight: 114.8 kg     Height: 5\' 10"  (1.778 m)       Intake/Output Summary (Last 24 hours) at 08/13/2021 2129 Last data filed at 08/13/2021 1700 Gross per 24 hour  Intake 600 ml  Output 750 ml  Net -150 ml   Filed Weights   08/11/21 0841 08/12/21 0454 08/13/21 0542  Weight: 114.3 kg 114 kg 114.8 kg    Examination:  General exam: Appears calm and comfortable  Respiratory system: Clear bilaterally Cardiovascular system: S1 & S2 heard, irregular no JVD, murmurs, rubs, gallops or clicks.  Gastrointestinal system: Abdomen is nondistended, soft and nontender. No organomegaly or masses felt. Normal bowel sounds heard. Central nervous system: Alert and oriented. No focal neurological deficits. Extremities: 1+  edema bilaterally Skin: No rashes, lesions or ulcers Psychiatry: Judgement and insight appear normal. Mood & affect appropriate.     Data Reviewed: I have personally reviewed following labs and imaging studies  CBC: Recent Labs  Lab 08/09/21 1016 08/09/21 1753 08/10/21 0343  WBC  --  7.5 5.8  NEUTROABS  --  6.5  --   HGB 12.6*  12.8* 11.2*  HCT 37.0* 40.9 36.5*  MCV  --  92.5 91.7  PLT  --  179 832   Basic Metabolic Panel: Recent Labs  Lab 08/09/21 1753 08/10/21 0343 08/11/21 0536 08/12/21 0422 08/13/21 0510  NA 136 134* 135 134* 135  K 3.6 3.0* 3.6 3.6 4.2  CL 98 94* 96* 94* 96*  CO2 28 29 35* 32 33*  GLUCOSE 152* 177* 125* 239* 209*  BUN 23 21 22  26* 28*  CREATININE 1.09 0.98 0.89 0.92 0.75  CALCIUM 9.0 8.5* 8.1* 8.2* 8.6*  MG  --  1.5* 1.9 1.9 2.0   GFR: Estimated Creatinine Clearance: 96.6 mL/min (by C-G formula based on SCr of 0.75 mg/dL). Liver Function Tests: Recent Labs  Lab 08/09/21 1753 08/10/21 0343 08/11/21 0536 08/12/21 0422 08/13/21 0510  AST 20 18 34 30 26  ALT 16 14 16 20 20   ALKPHOS 60 51 49 50 47  BILITOT 1.3* 1.5* 1.3* 1.1 0.7  PROT 8.6* 7.5 6.9 6.6 6.6  ALBUMIN 4.3 4.0 3.5 3.3* 3.4*   No results for input(s): LIPASE, AMYLASE in the last 168 hours. No results for input(s): AMMONIA in the last 168 hours. Coagulation Profile: No results for input(s): INR, PROTIME in the last 168 hours. Cardiac Enzymes: No results for input(s): CKTOTAL, CKMB, CKMBINDEX, TROPONINI in the last 168 hours. BNP (last 3 results) No results for input(s): PROBNP in the last 8760 hours. HbA1C: No results for input(s): HGBA1C in the last 72 hours.  CBG: Recent Labs  Lab 08/12/21 2124 08/13/21 0740 08/13/21 1153 08/13/21 1634 08/13/21 2026  GLUCAP 306* 199* 303* 235* 263*   Lipid Profile: No results for input(s): CHOL, HDL, LDLCALC, TRIG, CHOLHDL, LDLDIRECT in the last 72 hours. Thyroid Function Tests: No results for input(s): TSH, T4TOTAL, FREET4, T3FREE, THYROIDAB in the last 72 hours.  Anemia Panel: Recent Labs    08/12/21 0422 08/13/21 0510  FERRITIN 112 127   Sepsis Labs: No results for input(s): PROCALCITON, LATICACIDVEN in the last 168 hours.  Recent Results (from the past 240 hour(s))  Resp Panel by RT-PCR (Flu A&B, Covid) Nasopharyngeal Swab     Status: Abnormal    Collection Time: 08/09/21  7:39 PM   Specimen: Nasopharyngeal Swab; Nasopharyngeal(NP) swabs in vial transport medium  Result Value Ref Range Status   SARS Coronavirus 2 by RT PCR POSITIVE (A) NEGATIVE Final    Comment: RESULT CALLED TO, READ BACK BY AND VERIFIED WITH: Nichols,K@2116   by Matthews,B 10.26.2022 (NOTE) SARS-CoV-2 target nucleic acids are DETECTED.  The SARS-CoV-2 RNA is generally detectable in upper respiratory specimens during the acute phase of infection. Positive results are indicative of the presence of the identified virus, but do not rule out bacterial infection or co-infection with other pathogens not detected by the test. Clinical correlation with patient history and other diagnostic information is necessary to determine patient infection status. The expected result is Negative.  Fact Sheet for Patients: EntrepreneurPulse.com.au  Fact Sheet for Healthcare Providers: IncredibleEmployment.be  This test is not yet approved or cleared by the [place] FDA and  has been authorized for detection and/or  diagnosis of SARS-CoV-2 by FDA under an Emergency Use Authorization (EUA).  This EUA will remain in effect (meaning this test  can be used) for the duration of  the COVID-19 declaration under Section 564(b)(1) of the Act, 21 U.S.C. section 360bbb-3(b)(1), unless the authorization is terminated or revoked sooner.     Influenza A by PCR NEGATIVE NEGATIVE Final   Influenza B by PCR NEGATIVE NEGATIVE Final    Comment: (NOTE) The Xpert Xpress SARS-CoV-2/FLU/RSV plus assay is intended as an aid in the diagnosis of influenza from Nasopharyngeal swab specimens and should not be used as a sole basis for treatment. Nasal washings and aspirates are unacceptable for Xpert Xpress SARS-CoV-2/FLU/RSV testing.  Fact Sheet for Patients: EntrepreneurPulse.com.au  Fact Sheet for Healthcare  Providers: IncredibleEmployment.be  This test is not yet approved or cleared by the [place] FDA and has been authorized for detection and/or diagnosis of SARS-CoV-2 by FDA under an Emergency Use Authorization (EUA). This EUA will remain in effect (meaning this test can be used) for the duration of the COVID-19 declaration under Section 564(b)(1) of the Act, 21 U.S.C. section 360bbb-3(b)(1), unless the authorization is terminated or revoked.  Performed at [facility], [address], [place],  [address]          Radiology Studies: No results found.      Scheduled Meds:  amLODipine  10 mg Oral QHS   apixaban  5 mg Oral BID   vitamin C  500 mg Oral Daily   carvedilol  6.25 mg Oral BID WC   dorzolamide-timolol  1 drop Both Eyes QHS   folic acid  1 mg Oral Daily   furosemide  40 mg Intravenous BID   gemfibrozil  600 mg Oral Daily   insulin aspart  0-20 Units Subcutaneous TID WC   insulin aspart  0-5 Units Subcutaneous QHS   insulin aspart  5 Units Subcutaneous TID WC   insulin detemir  35 Units Subcutaneous QHS   lisinopril  40 mg Oral Daily   mouth rinse  15 mL Mouth Rinse BID   melatonin  6 mg Oral QHS   methylPREDNISolone (SOLU-MEDROL) injection  0.5 mg/kg Intravenous Q12H   Followed by   Derrill Memo ON 08/14/2021] predniSONE  50 mg Oral Daily   multivitamin with minerals  1 tablet Oral Daily   prednisoLONE acetate  1 drop Left Eye QHS   thiamine  100 mg Oral Daily   valACYclovir  1,000 mg Oral QHS   zinc sulfate  220 mg Oral Daily   Continuous Infusions:   LOS: 3 days    Time spent: 73mins    Kathie Dike, MD [facility]   If 7PM-7AM, please contact night-coverage [email or web address]  08/13/2021, 9:29 PM

## 2021-08-13 NOTE — Progress Notes (Signed)
Patient has been on Room air this shift, oxygen saturation has been between the 95-97%. Patient has no complaints of shortness or breath or discomfort. MD Memon aware.

## 2021-08-14 DIAGNOSIS — J9601 Acute respiratory failure with hypoxia: Secondary | ICD-10-CM | POA: Diagnosis not present

## 2021-08-14 DIAGNOSIS — I10 Essential (primary) hypertension: Secondary | ICD-10-CM | POA: Diagnosis not present

## 2021-08-14 DIAGNOSIS — E119 Type 2 diabetes mellitus without complications: Secondary | ICD-10-CM | POA: Diagnosis not present

## 2021-08-14 DIAGNOSIS — I4819 Other persistent atrial fibrillation: Secondary | ICD-10-CM | POA: Diagnosis not present

## 2021-08-14 LAB — COMPREHENSIVE METABOLIC PANEL
ALT: 23 U/L (ref 0–44)
AST: 24 U/L (ref 15–41)
Albumin: 3.6 g/dL (ref 3.5–5.0)
Alkaline Phosphatase: 48 U/L (ref 38–126)
Anion gap: 9 (ref 5–15)
BUN: 32 mg/dL — ABNORMAL HIGH (ref 8–23)
CO2: 32 mmol/L (ref 22–32)
Calcium: 8.6 mg/dL — ABNORMAL LOW (ref 8.9–10.3)
Chloride: 92 mmol/L — ABNORMAL LOW (ref 98–111)
Creatinine, Ser: 0.88 mg/dL (ref 0.61–1.24)
GFR, Estimated: >60 mL/min (ref >60)
Glucose, Bld: 187 mg/dL — ABNORMAL HIGH (ref 70–99)
Potassium: 4 mmol/L (ref 3.5–5.1)
Sodium: 133 mmol/L — ABNORMAL LOW (ref 135–145)
Total Bilirubin: 0.6 mg/dL (ref 0.3–1.2)
Total Protein: 6.9 g/dL (ref 6.5–8.1)

## 2021-08-14 LAB — CBC
HCT: 38.2 % — ABNORMAL LOW (ref 39.0–52.0)
Hemoglobin: 12.1 g/dL — ABNORMAL LOW (ref 13.0–17.0)
MCH: 28.2 pg (ref 26.0–34.0)
MCHC: 31.7 g/dL (ref 30.0–36.0)
MCV: 89 fL (ref 80.0–100.0)
Platelets: 166 10*3/uL (ref 150–400)
RBC: 4.29 MIL/uL (ref 4.22–5.81)
RDW: 16.2 % — ABNORMAL HIGH (ref 11.5–15.5)
WBC: 8.7 10*3/uL (ref 4.0–10.5)
nRBC: 0 % (ref 0.0–0.2)

## 2021-08-14 LAB — GLUCOSE, CAPILLARY
Glucose-Capillary: 168 mg/dL — ABNORMAL HIGH (ref 70–99)
Glucose-Capillary: 294 mg/dL — ABNORMAL HIGH (ref 70–99)
Glucose-Capillary: 274 mg/dL — ABNORMAL HIGH (ref 70–99)
Glucose-Capillary: 327 mg/dL — ABNORMAL HIGH (ref 70–99)

## 2021-08-14 LAB — D-DIMER, QUANTITATIVE: D-Dimer, Quant: <0.27 ug/mL-FEU (ref 0.00–0.50)

## 2021-08-14 LAB — MAGNESIUM: Magnesium: 1.9 mg/dL (ref 1.7–2.4)

## 2021-08-14 LAB — C-REACTIVE PROTEIN: CRP: 1.7 mg/dL — ABNORMAL HIGH (ref <1.0)

## 2021-08-14 LAB — FERRITIN: Ferritin: 111 ng/mL (ref 24–336)

## 2021-08-14 MED ORDER — FUROSEMIDE 40 MG PO TABS
40.0000 mg | ORAL_TABLET | Freq: Two times a day (BID) | ORAL | Status: DC
  Administered 2021-08-14 – 2021-08-15 (×2): 40 mg via ORAL
  Filled 2021-08-14 (×2): qty 1

## 2021-08-14 MED ORDER — BENZONATATE 100 MG PO CAPS: 200.0000 mg | ORAL_CAPSULE | Freq: Three times a day (TID) | ORAL | Status: DC | PRN

## 2021-08-14 NOTE — Progress Notes (Signed)
Inpatient Diabetes Program Recommendations  AACE/ADA: New Consensus Statement on Inpatient Glycemic Control (2015)  Target Ranges:  Prepandial:   less than 140 mg/dL      Peak postprandial:   less than 180 mg/dL (1-2 hours)      Critically ill patients:  140 - 180 mg/dL   Lab Results  Component Value Date   GLUCAP 294 (H) 08/14/2021   HGBA1C 7.8 (H) 08/10/2021    Review of Glycemic Control Results for Lucas Dudley, Lucas Dudley (MRN [id number]) as of 08/14/2021 11:32  Ref. Range 08/13/2021 07:40 08/13/2021 11:53 08/13/2021 16:34 08/13/2021 20:26 08/14/2021 07:17 08/14/2021 11:08  Glucose-Capillary Latest Ref Range: 70 - 99 mg/dL 199 (H) 303 (H) 235 (H) 263 (H) 168 (H) 294 (H)   Diabetes history: DM 2 Outpatient Diabetes medications: Amaryl 2 mg Daily, Basaglar 35 units qhs, metformin 1000 mg bid, Actos 15 mg Daily Current orders for Inpatient glycemic control:  Levemir 35 units qhs Novolog 0-20 units tid + hs Novolog 5 units tid meal coverage  PO prednisone 50 mg Daily  Inpatient Diabetes Program Recommendations:    -  Increase Novolog meal coverage to 8 units tid  Thanks,  Tama Headings RN, MSN, BC-ADM Inpatient Diabetes Coordinator Team Pager [phone] (8a-5p)

## 2021-08-14 NOTE — Progress Notes (Signed)
PROGRESS NOTE    Lucas Dudley  XIP:[id number] DOB: 08-03-1941 DOA: 08/09/2021 PCP: [name], MD    Brief Narrative:  80 year old male who presented to the emergency room with shortness of breath.  He underwent cardioversion for atrial fibrillation on the same day of presentation.  He was noted to be in decompensated CHF.  Admitted for IV diuretics.  Also found to be incidentally positive for COVID.  Patient has been vaccinated.  He is requiring supplemental oxygen due to CHF exacerbation.   Assessment & Plan:   Active Problems:   Benign essential HTN   Diabetes mellitus with no complication (HCC)   OSA (obstructive sleep apnea)   A-fib (HCC)   CHF exacerbation (HCC)   Acute respiratory failure with hypoxia (HCC)   Acute on chronic diastolic congestive heart failure -Continues to have evidence of volume overload -Appears to be having good urine output with IV Lasix -Net volume status -6.3L thus far -He is already on beta-blocker and ACE inhibitor -We will transition IV Lasix to oral Lasix and monitor urine output -If he continues to have good urine output on oral Lasix, suspected to discharge home in a.m.  Acute respiratory failure with hypoxia -He was noted to be hypoxic on room air and has been having significant orthopnea -He is not normally on any oxygen -Was previously requiring up to 5 L of oxygen -Now has been weaned down to room air  COVID-19 infection -He did test positive for COVID-19 -Started on molnupiravir -Has been refusing medication since he does feel that it is making his shortness of breath worse -Started on steroids due to rising inflammatory markers -CRP now trending down and appears to be clinically improving -Solu-Medrol transition to prednisone  Paroxysmal atrial fibrillation -Patient underwent cardioversion on 10/26 -Unfortunately, appears to be back in atrial fibrillation -Heart rate is controlled -Continue on Coreg -Anticoagulated with  Eliquis -Plans are to follow-up with A. fib clinic to discuss further management of A. fib  Obstructive sleep apnea -Continue on CPAP  Hypokalemia/hypomagnesemia -Replace  Diabetes type II, uncontrolled with hyperglycemia -Blood sugars elevated in the setting of steroids -On sliding scale and Levemir   DVT prophylaxis: SCDs Start: 08/09/21 2013 apixaban (ELIQUIS) tablet 5 mg  Code Status: Full code Family Communication: Updated patient's daughter, [name] over the phone 10/28 Disposition Plan: Status is: Inpatient  Remains inpatient appropriate because: He needs continued IV diuresis         Consultants:    Procedures:  Echo with EF 60-65%  Antimicrobials:      Subjective: Continues to have cough.  Feels her breathing is improving.  Objective: Vitals:   08/13/21 2027 08/14/21 0434 08/14/21 0500 08/14/21 1241  BP: 135/78 (!) 109/50  (!) 148/79  Pulse: 84 89  84  Resp: 20 19  18   Temp: 98.2 F (36.8 C) 98.2 F (36.8 C)  97.7 F (36.5 C)  TempSrc:    Oral  SpO2: 97% 97%  97%  Weight:   115.1 kg   Height:        Intake/Output Summary (Last 24 hours) at 08/14/2021 1925 Last data filed at 08/14/2021 1700 Gross per 24 hour  Intake 720 ml  Output 1600 ml  Net -880 ml   Filed Weights   08/12/21 0454 08/13/21 0542 08/14/21 0500  Weight: 114 kg 114.8 kg 115.1 kg    Examination:  General exam: Appears calm and comfortable  Respiratory system: Clear bilaterally Cardiovascular system: S1 & S2 heard, irregular no JVD,  murmurs, rubs, gallops or clicks.  Gastrointestinal system: Abdomen is nondistended, soft and nontender. No organomegaly or masses felt. Normal bowel sounds heard. Central nervous system: Alert and oriented. No focal neurological deficits. Extremities: 1+ edema bilaterally Skin: No rashes, lesions or ulcers Psychiatry: Judgement and insight appear normal. Mood & affect appropriate.     Data Reviewed: I have personally reviewed following  labs and imaging studies  CBC: Recent Labs  Lab 08/09/21 1016 08/09/21 1753 08/10/21 0343 08/14/21 0535  WBC  --  7.5 5.8 8.7  NEUTROABS  --  6.5  --   --   HGB 12.6* 12.8* 11.2* 12.1*  HCT 37.0* 40.9 36.5* 38.2*  MCV  --  92.5 91.7 89.0  PLT  --  179 154 654   Basic Metabolic Panel: Recent Labs  Lab 08/10/21 0343 08/11/21 0536 08/12/21 0422 08/13/21 0510 08/14/21 0535  NA 134* 135 134* 135 133*  K 3.0* 3.6 3.6 4.2 4.0  CL 94* 96* 94* 96* 92*  CO2 29 35* 32 33* 32  GLUCOSE 177* 125* 239* 209* 187*  BUN 21 22 26* 28* 32*  CREATININE 0.98 0.89 0.92 0.75 0.88  CALCIUM 8.5* 8.1* 8.2* 8.6* 8.6*  MG 1.5* 1.9 1.9 2.0 1.9   GFR: Estimated Creatinine Clearance: 87.9 mL/min (by C-G formula based on SCr of 0.88 mg/dL). Liver Function Tests: Recent Labs  Lab 08/10/21 0343 08/11/21 0536 08/12/21 0422 08/13/21 0510 08/14/21 0535  AST 18 34 30 26 24   ALT 14 16 20 20 23   ALKPHOS 51 49 50 47 48  BILITOT 1.5* 1.3* 1.1 0.7 0.6  PROT 7.5 6.9 6.6 6.6 6.9  ALBUMIN 4.0 3.5 3.3* 3.4* 3.6   No results for input(s): LIPASE, AMYLASE in the last 168 hours. No results for input(s): AMMONIA in the last 168 hours. Coagulation Profile: No results for input(s): INR, PROTIME in the last 168 hours. Cardiac Enzymes: No results for input(s): CKTOTAL, CKMB, CKMBINDEX, TROPONINI in the last 168 hours. BNP (last 3 results) No results for input(s): PROBNP in the last 8760 hours. HbA1C: No results for input(s): HGBA1C in the last 72 hours.  CBG: Recent Labs  Lab 08/13/21 1634 08/13/21 2026 08/14/21 0717 08/14/21 1108 08/14/21 1619  GLUCAP 235* 263* 168* 294* 274*   Lipid Profile: No results for input(s): CHOL, HDL, LDLCALC, TRIG, CHOLHDL, LDLDIRECT in the last 72 hours. Thyroid Function Tests: No results for input(s): TSH, T4TOTAL, FREET4, T3FREE, THYROIDAB in the last 72 hours.  Anemia Panel: Recent Labs    08/13/21 0510 08/14/21 0535  FERRITIN 127 111   Sepsis Labs: No  results for input(s): PROCALCITON, LATICACIDVEN in the last 168 hours.  Recent Results (from the past 240 hour(s))  Resp Panel by RT-PCR (Flu A&B, Covid) Nasopharyngeal Swab     Status: Abnormal   Collection Time: 08/09/21  7:39 PM   Specimen: Nasopharyngeal Swab; Nasopharyngeal(NP) swabs in vial transport medium  Result Value Ref Range Status   SARS Coronavirus 2 by RT PCR POSITIVE (A) NEGATIVE Final    Comment: RESULT CALLED TO, READ BACK BY AND VERIFIED WITH: Nichols,K@2116   by Matthews,B 10.26.2022 (NOTE) SARS-CoV-2 target nucleic acids are DETECTED.  The SARS-CoV-2 RNA is generally detectable in upper respiratory specimens during the acute phase of infection. Positive results are indicative of the presence of the identified virus, but do not rule out bacterial infection or co-infection with other pathogens not detected by the test. Clinical correlation with patient history and other diagnostic information is necessary to determine  patient infection status. The expected result is Negative.  Fact Sheet for Patients: EntrepreneurPulse.com.au  Fact Sheet for Healthcare Providers: IncredibleEmployment.be  This test is not yet approved or cleared by the [place] FDA and  has been authorized for detection and/or diagnosis of SARS-CoV-2 by FDA under an Emergency Use Authorization (EUA).  This EUA will remain in effect (meaning this test  can be used) for the duration of  the COVID-19 declaration under Section 564(b)(1) of the Act, 21 U.S.C. section 360bbb-3(b)(1), unless the authorization is terminated or revoked sooner.     Influenza A by PCR NEGATIVE NEGATIVE Final   Influenza B by PCR NEGATIVE NEGATIVE Final    Comment: (NOTE) The Xpert Xpress SARS-CoV-2/FLU/RSV plus assay is intended as an aid in the diagnosis of influenza from Nasopharyngeal swab specimens and should not be used as a sole basis for treatment. Nasal washings  and aspirates are unacceptable for Xpert Xpress SARS-CoV-2/FLU/RSV testing.  Fact Sheet for Patients: EntrepreneurPulse.com.au  Fact Sheet for Healthcare Providers: IncredibleEmployment.be  This test is not yet approved or cleared by the [place] FDA and has been authorized for detection and/or diagnosis of SARS-CoV-2 by FDA under an Emergency Use Authorization (EUA). This EUA will remain in effect (meaning this test can be used) for the duration of the COVID-19 declaration under Section 564(b)(1) of the Act, 21 U.S.C. section 360bbb-3(b)(1), unless the authorization is terminated or revoked.  Performed at [facility], [address], [place], [place] [address]          Radiology Studies: No results found.      Scheduled Meds:  amLODipine  10 mg Oral QHS   apixaban  5 mg Oral BID   vitamin C  500 mg Oral Daily   carvedilol  6.25 mg Oral BID WC   dorzolamide-timolol  1 drop Both Eyes QHS   folic acid  1 mg Oral Daily   furosemide  40 mg Oral BID   gemfibrozil  600 mg Oral Daily   insulin aspart  0-20 Units Subcutaneous TID WC   insulin aspart  0-5 Units Subcutaneous QHS   insulin aspart  5 Units Subcutaneous TID WC   insulin detemir  35 Units Subcutaneous QHS   lisinopril  40 mg Oral Daily   mouth rinse  15 mL Mouth Rinse BID   melatonin  6 mg Oral QHS   multivitamin with minerals  1 tablet Oral Daily   predniSONE  50 mg Oral Daily   thiamine  100 mg Oral Daily   valACYclovir  1,000 mg Oral QHS   zinc sulfate  220 mg Oral Daily   Continuous Infusions:   LOS: 4 days    Time spent: 37mins    Kathie Dike, MD [facility]   If 7PM-7AM, please contact night-coverage [email or web address]  08/14/2021, 7:25 PM

## 2021-08-14 NOTE — Care Management Important Message (Signed)
Important Message  Patient Details  Name: Lucas Dudley MRN: [id number] Date of Birth: [birth year]   Medicare Important Message Given:  Yes     [name] 08/14/2021, 2:07 PM

## 2021-08-14 NOTE — Plan of Care (Signed)

## 2021-08-15 DIAGNOSIS — J9601 Acute respiratory failure with hypoxia: Secondary | ICD-10-CM | POA: Diagnosis not present

## 2021-08-15 DIAGNOSIS — I4819 Other persistent atrial fibrillation: Secondary | ICD-10-CM | POA: Diagnosis not present

## 2021-08-15 DIAGNOSIS — E119 Type 2 diabetes mellitus without complications: Secondary | ICD-10-CM | POA: Diagnosis not present

## 2021-08-15 DIAGNOSIS — I10 Essential (primary) hypertension: Secondary | ICD-10-CM | POA: Diagnosis not present

## 2021-08-15 LAB — COMPREHENSIVE METABOLIC PANEL
ALT: 23 U/L (ref 0–44)
AST: 22 U/L (ref 15–41)
Albumin: 3.7 g/dL (ref 3.5–5.0)
Alkaline Phosphatase: 50 U/L (ref 38–126)
Anion gap: 8 (ref 5–15)
BUN: 33 mg/dL — ABNORMAL HIGH (ref 8–23)
CO2: 33 mmol/L — ABNORMAL HIGH (ref 22–32)
Calcium: 8.8 mg/dL — ABNORMAL LOW (ref 8.9–10.3)
Chloride: 92 mmol/L — ABNORMAL LOW (ref 98–111)
Creatinine, Ser: 0.82 mg/dL (ref 0.61–1.24)
GFR, Estimated: >60 mL/min (ref >60)
Glucose, Bld: 119 mg/dL — ABNORMAL HIGH (ref 70–99)
Potassium: 3.3 mmol/L — ABNORMAL LOW (ref 3.5–5.1)
Sodium: 133 mmol/L — ABNORMAL LOW (ref 135–145)
Total Bilirubin: 0.8 mg/dL (ref 0.3–1.2)
Total Protein: 7.2 g/dL (ref 6.5–8.1)

## 2021-08-15 LAB — MAGNESIUM: Magnesium: 1.9 mg/dL (ref 1.7–2.4)

## 2021-08-15 LAB — GLUCOSE, CAPILLARY: Glucose-Capillary: 124 mg/dL — ABNORMAL HIGH (ref 70–99)

## 2021-08-15 MED ORDER — POTASSIUM CHLORIDE CRYS ER 20 MEQ PO TBCR
40.0000 meq | EXTENDED_RELEASE_TABLET | ORAL | Status: DC
  Administered 2021-08-15: 40 meq via ORAL
  Filled 2021-08-15: qty 2

## 2021-08-15 MED ORDER — AMLODIPINE BESYLATE 10 MG PO TABS: 10.0000 mg | ORAL_TABLET | Freq: Every day | ORAL | Status: DC

## 2021-08-15 MED ORDER — POTASSIUM CHLORIDE CRYS ER 20 MEQ PO TBCR: 20.0000 meq | EXTENDED_RELEASE_TABLET | Freq: Every day | ORAL | 3 refills | Status: DC

## 2021-08-15 MED ORDER — BASAGLAR KWIKPEN 100 UNIT/ML ~~LOC~~ SOPN
35.0000 [IU] | PEN_INJECTOR | Freq: Every day | SUBCUTANEOUS | Status: DC
Start: 2021-08-15 — End: 2022-07-24

## 2021-08-15 MED ORDER — BENZONATATE 200 MG PO CAPS
200.0000 mg | ORAL_CAPSULE | Freq: Three times a day (TID) | ORAL | 0 refills | Status: DC | PRN
Start: 2021-08-15 — End: 2021-09-27

## 2021-08-15 MED ORDER — PREDNISONE 20 MG PO TABS: 40.0000 mg | ORAL_TABLET | Freq: Every day | ORAL | 0 refills | Status: DC

## 2021-08-15 NOTE — Discharge Summary (Signed)
Physician Discharge Summary  Lucas Dudley ENI:[id number] DOB: 03/08/1941 DOA: 08/09/2021  PCP: [name], MD  Admit date: 08/09/2021 Discharge date: 08/15/2021  Admitted From: home Disposition:  home  Recommendations for Outpatient Follow-up:  Follow up with PCP in 1-2 weeks Please obtain BMP/CBC in one week Patient already has follow up set up with EP next week  Home Health: Equipment/Devices:  Discharge Condition: stable CODE STATUS:full code Diet recommendation: heart healthy, carb modified  Brief/Interim Summary: 80 year old male who presented to the emergency room with shortness of breath.  He underwent cardioversion for atrial fibrillation on the same day of presentation.  He was noted to be in decompensated CHF.  Admitted for IV diuretics.  Also found to be incidentally positive for COVID.  Patient has been vaccinated.  He is requiring supplemental oxygen due to CHF exacerbation.  Discharge Diagnoses:  Active Problems:   Benign essential HTN   Diabetes mellitus with no complication (HCC)   OSA (obstructive sleep apnea)   A-fib (HCC)   CHF exacerbation (HCC)   Acute respiratory failure with hypoxia (HCC)  Acute on chronic diastolic congestive heart failure -Echo with normal EF -Appears to be having good urine output with IV Lasix -Net volume status -6.3L thus far -He is already on beta-blocker and ACE inhibitor -he was transitioned back to home dose of lasix -patient did admit that he was not taking his lasix at home BID. He was only taking it once daily. He was advised to take lasix as prescribed by his cardiology team as BID. This dose can be further adjusted when he follows up next week   Acute respiratory failure with hypoxia -He was noted to be hypoxic on room air and has been having significant orthopnea -He is not normally on any oxygen -Was previously requiring up to 5 L of oxygen -Now has been weaned down to room air and is able to ambulate without  difficulty   COVID-19 infection -He did test positive for COVID-19 -did not want to take molnupiravir -Started on steroids due to rising inflammatory markers -CRP now trending down and appears to be clinically improving -Solu-Medrol transitioned to prednisone, will complete 10 day course of steroids -continue antitussives prn   Paroxysmal atrial fibrillation -Patient underwent cardioversion on 10/26 -Unfortunately, appears to be back in atrial fibrillation -Heart rate is controlled -Continue on Coreg -Anticoagulated with Eliquis -Plans are to follow-up with A. fib clinic to discuss further management of A. fib   Obstructive sleep apnea -Continue on CPAP   Hypokalemia/hypomagnesemia -Replace   Diabetes type II, uncontrolled with hyperglycemia -Blood sugars elevated in the setting of steroids -will continue insulin glargine on discharge -resume glimepiride and metformin -will discontinue pioglitazone due to risk of volume overload -anticipate blood sugars should improve as steroids are tapered.  Discharge Instructions  Discharge Instructions     Diet - low sodium heart healthy   Complete by: As directed    Increase activity slowly   Complete by: As directed    No wound care   Complete by: As directed       Allergies as of 08/15/2021       Reactions   Tape Rash, Other (Dudley Comments)   Causes skin redness, Use paper tape only.        Medication List     STOP taking these medications    Combigan 0.2-0.5 % ophthalmic solution Generic drug: brimonidine-timolol   pioglitazone 15 MG tablet Commonly known as: ACTOS  TAKE these medications    amLODipine 10 MG tablet Commonly known as: NORVASC Take 1 tablet (10 mg total) by mouth at bedtime.   apixaban 5 MG Tabs tablet Commonly known as: Eliquis Take 1 tablet (5 mg total) by mouth 2 (two) times daily.   Basaglar KwikPen 100 UNIT/ML Inject 35 Units into the skin at bedtime.   benzonatate 200 MG  capsule Commonly known as: TESSALON Take 1 capsule (200 mg total) by mouth 3 (three) times daily as needed for cough.   carvedilol 6.25 MG tablet Commonly known as: COREG Takes 1 tablet by mouth in the AM and 1 tablet by mouth in the PM   diphenhydramine-acetaminophen 25-500 MG Tabs tablet Commonly known as: TYLENOL PM Take 2 tablets by mouth at bedtime.   dorzolamide-timolol 22.3-6.8 MG/ML ophthalmic solution Commonly known as: COSOPT Place 1 drop into both eyes 2 (two) times daily. What changed: when to take this   furosemide 40 MG tablet Commonly known as: LASIX Take 1 tablet (40 mg total) by mouth 2 (two) times daily.   gemfibrozil 600 MG tablet Commonly known as: LOPID Take 600 mg by mouth at bedtime.   glimepiride 2 MG tablet Commonly known as: AMARYL Take 2 mg by mouth 2 (two) times daily.   lisinopril 40 MG tablet Commonly known as: ZESTRIL Take 40 mg by mouth daily.   Melatonin 10 MG Tabs Take 10 mg by mouth at bedtime.   metFORMIN 500 MG 24 hr tablet Commonly known as: GLUCOPHAGE-XR Take 1,000 mg by mouth in the morning and at bedtime.   mineral oil liquid Take 15 mLs by mouth daily as needed for moderate constipation.   multivitamin with minerals tablet Take 1 tablet by mouth daily. Centrum men 50+   Omega-3 1000 MG Caps Take 1,000 mg by mouth daily.   potassium chloride SA 20 MEQ tablet Commonly known as: Klor-Con M20 Take 1 tablet (20 mEq total) by mouth daily.   prednisoLONE acetate 1 % ophthalmic suspension Commonly known as: PRED FORTE Place 1 drop into the left eye at bedtime.   predniSONE 20 MG tablet Commonly known as: DELTASONE Take 2 tablets (40 mg total) by mouth daily. Start taking on: August 16, 2021   STOOL SOFTENER PO Take 100 mg by mouth every evening.   valACYclovir 1000 MG tablet Commonly known as: VALTREX Take 1,000 mg by mouth at bedtime.        Allergies  Allergen Reactions   Tape Rash and Other (Dudley Comments)     Causes skin redness, Use paper tape only.    Consultations:    Procedures/Studies: DG Chest Port 1 View  Result Date: 08/09/2021 CLINICAL DATA:  Shortness of breath. EXAM: PORTABLE CHEST 1 VIEW COMPARISON:  Chest x-ray 11/24/2020. CT heart 05/31/2021. FINDINGS: The heart is enlarged. There are diffuse reticular opacities throughout both lungs increased from prior. There is no pleural effusion or pneumothorax. Prominent pleural fat in the lower right hemithorax appears unchanged. Heart is enlarged, unchanged. No acute fractures are seen. There are postsurgical changes in the right shoulder. IMPRESSION: 1. Cardiomegaly. 2. Diffuse interstitial opacities worrisome for mild to moderate pulmonary edema. Infection is not excluded. Electronically Signed   By: Ronney Asters M.D.   On: 08/09/2021 18:08   ECHOCARDIOGRAM COMPLETE  Result Date: 08/10/2021    ECHOCARDIOGRAM REPORT   Patient Name:   Lucas Dudley Date of Exam: 08/10/2021 Medical Rec #:  [id number]     Height:  70.0 in Accession #:    [id number]    Weight:       256.0 lb Date of Birth:  1940-12-02      BSA:          2.318 m Patient Age:    65 years      BP:           126/68 mmHg Patient Gender: M             HR:           82 bpm. Exam Location:  [facility] Procedure: 2D Echo, Cardiac Doppler and Color Doppler Indications:    CHF  History:        Patient has prior history of Echocardiogram examinations, most                 recent 05/10/2021. CHF, CAD, Arrythmias:Atrial Fibrillation,                 Signs/Symptoms:Shortness of Breath; Risk Factors:Hypertension,                 Diabetes and Dyslipidemia.  Sonographer:    Wenda Low Referring Phys: [id number] ASIA B [place]  Sonographer Comments: COVID + IMPRESSIONS  1. Left ventricular ejection fraction, by estimation, is 60 to 65%. The left ventricle has normal function. Left ventricular endocardial border not optimally defined to evaluate regional wall motion. There is mild left  ventricular hypertrophy. Left ventricular diastolic parameters are indeterminate.  2. Right ventricular systolic function is normal. The right ventricular size is normal. Tricuspid regurgitation signal is inadequate for assessing PA pressure.  3. The mitral valve is abnormal. Mild mitral valve regurgitation. No evidence of mitral stenosis.  4. The aortic valve was not well visualized. There is mild calcification of the aortic valve. There is mild thickening of the aortic valve. Aortic valve regurgitation is not visualized. No aortic stenosis is present. FINDINGS  Left Ventricle: Left ventricular ejection fraction, by estimation, is 60 to 65%. The left ventricle has normal function. Left ventricular endocardial border not optimally defined to evaluate regional wall motion. The left ventricular internal cavity size was normal in size. There is mild left ventricular hypertrophy. Left ventricular diastolic parameters are indeterminate. Right Ventricle: The right ventricular size is normal. No increase in right ventricular wall thickness. Right ventricular systolic function is normal. Tricuspid regurgitation signal is inadequate for assessing PA pressure. Left Atrium: Left atrial size was normal in size. Right Atrium: Right atrial size was normal in size. Pericardium: There is no evidence of pericardial effusion. Mitral Valve: The mitral valve is abnormal. Mild mitral valve regurgitation. No evidence of mitral valve stenosis. MV peak gradient, 5.3 mmHg. The mean mitral valve gradient is 1.0 mmHg. Tricuspid Valve: The tricuspid valve is not well visualized. Tricuspid valve regurgitation is not demonstrated. No evidence of tricuspid stenosis. Aortic Valve: The aortic valve was not well visualized. There is mild calcification of the aortic valve. There is mild thickening of the aortic valve. There is mild aortic valve annular calcification. Aortic valve regurgitation is not visualized. No aortic stenosis is present. Aortic  valve mean gradient measures 3.5 mmHg. Aortic valve peak gradient measures 7.0 mmHg. Aortic valve area, by VTI measures 2.57 cm. Pulmonic Valve: The pulmonic valve was not well visualized. Pulmonic valve regurgitation is not visualized. No evidence of pulmonic stenosis. Aorta: The aortic root is normal in size and structure. Venous: The inferior vena cava was not well visualized. IAS/Shunts: The interatrial septum was not  well visualized.  LEFT VENTRICLE PLAX 2D LVIDd:         5.10 cm   Diastology LVIDs:         2.80 cm   LV e' lateral:   13.20 cm/s LV PW:         1.20 cm   LV E/e' lateral: 7.7 LV IVS:        1.20 cm LVOT diam:     2.10 cm LV SV:         64 LV SV Index:   27 LVOT Area:     3.46 cm  RIGHT VENTRICLE RV Basal diam:  4.70 cm RV Mid diam:    3.80 cm RV S prime:     12.90 cm/s TAPSE (M-mode): 2.3 cm LEFT ATRIUM             Index        RIGHT ATRIUM           Index LA diam:        4.30 cm 1.85 cm/m   RA Area:     16.90 cm LA Vol (A2C):   76.8 ml 33.13 ml/m  RA Volume:   43.20 ml  18.64 ml/m LA Vol (A4C):   77.1 ml 33.26 ml/m LA Biplane Vol: 80.6 ml 34.77 ml/m  AORTIC VALVE                    PULMONIC VALVE AV Area (Vmax):    2.57 cm     PV Vmax:       0.80 m/s AV Area (Vmean):   2.17 cm     PV Peak grad:  2.5 mmHg AV Area (VTI):     2.57 cm AV Vmax:           132.00 cm/s AV Vmean:          91.450 cm/s AV VTI:            0.248 m AV Peak Grad:      7.0 mmHg AV Mean Grad:      3.5 mmHg LVOT Vmax:         97.90 cm/s LVOT Vmean:        57.300 cm/s LVOT VTI:          0.184 m LVOT/AV VTI ratio: 0.74  AORTA Ao Root diam: 3.30 cm MITRAL VALVE MV Area (PHT): 5.23 cm     SHUNTS MV Area VTI:   2.35 cm     Systemic VTI:  0.18 m MV Peak grad:  5.3 mmHg     Systemic Diam: 2.10 cm MV Mean grad:  1.0 mmHg MV Vmax:       1.15 m/s MV Vmean:      37.5 cm/s MV Decel Time: 145 msec MV E velocity: 101.00 cm/s Carlyle Dolly MD Electronically signed by Carlyle Dolly MD Signature Date/Time: 08/10/2021/2:54:24 PM     Final    Intravitreal Injection, Pharmacologic Agent - OS - Left Eye  Result Date: 08/04/2021 Time Out 08/04/2021. 2:29 PM. Confirmed correct patient, procedure, site, and patient consented. Anesthesia Topical anesthesia was used. Anesthetic medications included Lidocaine 2%, Proparacaine 0.5%. Procedure Preparation included 5% betadine to ocular surface, eyelid speculum. A (32g) needle was used. Injection: 2 mg aflibercept 2 MG/0.05ML   Route: Intravitreal, Site: Left Eye   NDC: [id number], Lot: [id number], Expiration date: 06/13/2022, Waste: 0.05 mL Post-op Post injection exam found visual acuity of at least counting fingers. The patient tolerated the procedure  well. There were no complications. The patient received written and verbal post procedure care education. Post injection medications were not given.   OCT, Retina - OU - Both Eyes  Result Date: 08/04/2021 Right Eye Quality was good. Central Foveal Thickness: 307. Progression has been stable. Findings include abnormal foveal contour, no SRF, vitreomacular adhesion , intraretinal fluid (Persistent cystic changes superior to fovea). Left Eye Quality was good. Central Foveal Thickness: 328. Progression has improved. Findings include abnormal foveal contour, intraretinal fluid, intraretinal hyper-reflective material (Mild interval improvement in IRF/cystic changes; partial PVD). 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 CME - cystoid macular edema. PED - pigment epithelial detachment. IRF - intraretinal fluid. SRF - subretinal fluid. EZ - ellipsoid zone. ERM - epiretinal membrane. ORA - outer retinal atrophy. ORT - outer retinal tubulation. SRHM - subretinal hyper-reflective material. IRHM - intraretinal hyper-reflective  material     Subjective: He is feeling better today. Shortness of breath improved. He still has non productive cough  Discharge Exam: Vitals:   08/14/21 0500 08/14/21 1241 08/14/21 2058 08/15/21 0500  BP:  (!) 148/79 122/75 140/67  Pulse:  84 62 68  Resp:  18 20 18   Temp:  97.7 F (36.5 C) 98.3 F (36.8 C) 98 F (36.7 C)  TempSrc:  Oral  Oral  SpO2:  97% 95% 94%  Weight: 115.1 kg   110.4 kg  Height:        General: Pt is alert, awake, not in acute distress Cardiovascular: RRR, S1/S2 +, no rubs, no gallops Respiratory: CTA bilaterally, no wheezing, no rhonchi Abdominal: Soft, NT, ND, bowel sounds + Extremities: 1+ edema, no cyanosis    The results of significant diagnostics from this hospitalization (including imaging, microbiology, ancillary and laboratory) are listed below for reference.     Microbiology: Recent Results (from the past 240 hour(s))  Resp Panel by RT-PCR (Flu A&B, Covid) Nasopharyngeal Swab     Status: Abnormal   Collection Time: 08/09/21  7:39 PM   Specimen: Nasopharyngeal Swab; Nasopharyngeal(NP) swabs in vial transport medium  Result Value Ref Range Status   SARS Coronavirus 2 by RT PCR POSITIVE (A) NEGATIVE Final    Comment: RESULT CALLED TO, READ BACK BY AND VERIFIED WITH: Nichols,K@[id number]   by Matthews,B 10.26.2022 (NOTE) SARS-CoV-2 target nucleic acids are DETECTED.  The SARS-CoV-2 RNA is generally detectable in upper respiratory specimens during the acute phase of infection. Positive results are indicative of the presence of the identified virus, but do not rule out bacterial infection or co-infection with other pathogens not detected by the test. Clinical correlation with patient history and other diagnostic information is necessary to determine patient infection status. The expected result is Negative.  Fact Sheet for Patients: EntrepreneurPulse.com.au  Fact Sheet for Healthcare  Providers: IncredibleEmployment.be  This test is not yet approved or cleared by the [place] FDA and  has been authorized for detection and/or diagnosis of SARS-CoV-2 by FDA under an Emergency Use Authorization (EUA).  This EUA will remain in effect (meaning this test  can be used) for the duration of  the COVID-19 declaration under Section 564(b)(1) of the Act, 21 U.S.C. section 360bbb-3(b)(1), unless the authorization is terminated or revoked sooner.     Influenza A by PCR NEGATIVE NEGATIVE Final   Influenza B by PCR NEGATIVE  NEGATIVE Final    Comment: (NOTE) The Xpert Xpress SARS-CoV-2/FLU/RSV plus assay is intended as an aid in the diagnosis of influenza from Nasopharyngeal swab specimens and should not be used as a sole basis for treatment. Nasal washings and aspirates are unacceptable for Xpert Xpress SARS-CoV-2/FLU/RSV testing.  Fact Sheet for Patients: EntrepreneurPulse.com.au  Fact Sheet for Healthcare Providers: IncredibleEmployment.be  This test is not yet approved or cleared by the [place] FDA and has been authorized for detection and/or diagnosis of SARS-CoV-2 by FDA under an Emergency Use Authorization (EUA). This EUA will remain in effect (meaning this test can be used) for the duration of the COVID-19 declaration under Section 564(b)(1) of the Act, 21 U.S.C. section 360bbb-3(b)(1), unless the authorization is terminated or revoked.  Performed at [facility], [address] [place], [place] [address]      Labs: BNP (last 3 results) Recent Labs    08/09/21 1753  BNP 889.1*   Basic Metabolic Panel: Recent Labs  Lab 08/11/21 0536 08/12/21 0422 08/13/21 0510 08/14/21 0535 08/15/21 0623  NA 135 134* 135 133* 133*  K 3.6 3.6 4.2 4.0 3.3*  CL 96* 94* 96* 92* 92*  CO2 35* 32 33* 32 33*  GLUCOSE 125* 239* 209* 187* 119*  BUN 22 26* 28* 32* 33*  CREATININE 0.89 0.92 0.75 0.88 0.82   CALCIUM 8.1* 8.2* 8.6* 8.6* 8.8*  MG 1.9 1.9 2.0 1.9 1.9   Liver Function Tests: Recent Labs  Lab 08/11/21 0536 08/12/21 0422 08/13/21 0510 08/14/21 0535 08/15/21 0623  AST 34 30 26 24 22   ALT 16 20 20 23 23   ALKPHOS 49 50 47 48 50  BILITOT 1.3* 1.1 0.7 0.6 0.8  PROT 6.9 6.6 6.6 6.9 7.2  ALBUMIN 3.5 3.3* 3.4* 3.6 3.7   No results for input(s): LIPASE, AMYLASE in the last 168 hours. No results for input(s): AMMONIA in the last 168 hours. CBC: Recent Labs  Lab 08/09/21 1016 08/09/21 1753 08/10/21 0343 08/14/21 0535  WBC  --  7.5 5.8 8.7  NEUTROABS  --  6.5  --   --   HGB 12.6* 12.8* 11.2* 12.1*  HCT 37.0* 40.9 36.5* 38.2*  MCV  --  92.5 91.7 89.0  PLT  --  179 154 166   Cardiac Enzymes: No results for input(s): CKTOTAL, CKMB, CKMBINDEX, TROPONINI in the last 168 hours. BNP: Invalid input(s): POCBNP CBG: Recent Labs  Lab 08/14/21 0717 08/14/21 1108 08/14/21 1619 08/14/21 2056 08/15/21 0735  GLUCAP 168* 294* 274* 327* 124*   D-Dimer Recent Labs    08/13/21 0510 08/14/21 0535  DDIMER 0.32 <0.27   Hgb A1c No results for input(s): HGBA1C in the last 72 hours. Lipid Profile No results for input(s): CHOL, HDL, LDLCALC, TRIG, CHOLHDL, LDLDIRECT in the last 72 hours. Thyroid function studies No results for input(s): TSH, T4TOTAL, T3FREE, THYROIDAB in the last 72 hours.  Invalid input(s): FREET3 Anemia work up Recent Labs    08/13/21 0510 08/14/21 0535  FERRITIN 127 111   Urinalysis    Component Value Date/Time   COLORURINE YELLOW 10/18/2018 1525   APPEARANCEUR CLEAR 10/18/2018 1525   LABSPEC 1.012 10/18/2018 1525   PHURINE 5.0 10/18/2018 1525   GLUCOSEU 50 (A) 10/18/2018 1525   HGBUR MODERATE (A) 10/18/2018 1525   BILIRUBINUR NEGATIVE 10/18/2018 1525   KETONESUR NEGATIVE 10/18/2018 1525   PROTEINUR 100 (A) 10/18/2018 1525   NITRITE NEGATIVE 10/18/2018 [place] 10/18/2018 1525   Sepsis Labs Invalid input(s):  PROCALCITONIN,  WBC,  LACTICIDVEN Microbiology Recent Results (from the past 240 hour(s))  Resp Panel by RT-PCR (Flu A&B, Covid) Nasopharyngeal Swab     Status: Abnormal   Collection Time: 08/09/21  7:39 PM   Specimen: Nasopharyngeal Swab; Nasopharyngeal(NP) swabs in vial transport medium  Result Value Ref Range Status   SARS Coronavirus 2 by RT PCR POSITIVE (A) NEGATIVE Final    Comment: RESULT CALLED TO, READ BACK BY AND VERIFIED WITH: Nichols,K@[id number]   by Matthews,B 10.26.2022 (NOTE) SARS-CoV-2 target nucleic acids are DETECTED.  The SARS-CoV-2 RNA is generally detectable in upper respiratory specimens during the acute phase of infection. Positive results are indicative of the presence of the identified virus, but do not rule out bacterial infection or co-infection with other pathogens not detected by the test. Clinical correlation with patient history and other diagnostic information is necessary to determine patient infection status. The expected result is Negative.  Fact Sheet for Patients: EntrepreneurPulse.com.au  Fact Sheet for Healthcare Providers: IncredibleEmployment.be  This test is not yet approved or cleared by the [place] FDA and  has been authorized for detection and/or diagnosis of SARS-CoV-2 by FDA under an Emergency Use Authorization (EUA).  This EUA will remain in effect (meaning this test  can be used) for the duration of  the COVID-19 declaration under Section 564(b)(1) of the Act, 21 U.S.C. section 360bbb-3(b)(1), unless the authorization is terminated or revoked sooner.     Influenza A by PCR NEGATIVE NEGATIVE Final   Influenza B by PCR NEGATIVE NEGATIVE Final    Comment: (NOTE) The Xpert Xpress SARS-CoV-2/FLU/RSV plus assay is intended as an aid in the diagnosis of influenza from Nasopharyngeal swab specimens and should not be used as a sole basis for treatment. Nasal washings and aspirates are unacceptable for  Xpert Xpress SARS-CoV-2/FLU/RSV testing.  Fact Sheet for Patients: EntrepreneurPulse.com.au  Fact Sheet for Healthcare Providers: IncredibleEmployment.be  This test is not yet approved or cleared by the [place] FDA and has been authorized for detection and/or diagnosis of SARS-CoV-2 by FDA under an Emergency Use Authorization (EUA). This EUA will remain in effect (meaning this test can be used) for the duration of the COVID-19 declaration under Section 564(b)(1) of the Act, 21 U.S.C. section 360bbb-3(b)(1), unless the authorization is terminated or revoked.  Performed at [facility], [address], [place], [place] [address]      Time coordinating discharge: 51mins  SIGNED:   Kathie Dike, MD  [facility] 08/15/2021, 11:04 AM   If 7PM-7AM, please contact night-coverage [email or web address]

## 2021-08-15 NOTE — TOC Transition Note (Signed)
Transition of Care [facility]) - CM/SW Discharge Note   Patient Details  Name: Lucas Dudley MRN: [id number] Date of Birth: 09-20-1941  [facility]) CM/SW Contact:  Ihor Gully, LCSW Phone Number: 08/15/2021, 1:57 PM   Clinical Narrative:    Patient states that he tries to eat a heart healthy diet. He stopped taking daily weights but plans to start back taking daily weights. He is agreeable to [facility]. Tommi Rumps with [facility] is accepting [facility].     Final next level of care: [place] Barriers to Discharge: No Barriers Identified   Patient Goals and CMS Choice Patient states their goals for this hospitalization and ongoing recovery are:: return home      Discharge Placement                       Discharge Plan and Services                          HH Arranged: RN [facility] Agency: [place] Date [place]: 08/15/21 Time [place]: [id number] Representative spoke with at [place]: [place] ([place]) Interventions     Readmission Risk Interventions No flowsheet data found.

## 2021-08-22 ENCOUNTER — Other Ambulatory Visit: Payer: Self-pay

## 2021-08-22 ENCOUNTER — Ambulatory Visit (HOSPITAL_COMMUNITY)
Admission: RE | Admit: 2021-08-22 | Discharge: 2021-08-22 | Disposition: A | Discharge status: Home / Self Care | Payer: Medicare Other | Source: Ambulatory Visit | Attending: Nurse Practitioner | Admitting: Nurse Practitioner

## 2021-08-22 ENCOUNTER — Encounter (HOSPITAL_COMMUNITY): Payer: Self-pay | Admitting: Nurse Practitioner

## 2021-08-22 VITALS — BP 130/66 | HR 69 | Ht 70.0 in | Wt 246.4 lb

## 2021-08-22 DIAGNOSIS — Z7901 Long term (current) use of anticoagulants: Secondary | ICD-10-CM | POA: Diagnosis not present

## 2021-08-22 DIAGNOSIS — G4733 Obstructive sleep apnea (adult) (pediatric): Secondary | ICD-10-CM | POA: Insufficient documentation

## 2021-08-22 DIAGNOSIS — J9601 Acute respiratory failure with hypoxia: Secondary | ICD-10-CM | POA: Insufficient documentation

## 2021-08-22 DIAGNOSIS — I4819 Other persistent atrial fibrillation: Secondary | ICD-10-CM | POA: Diagnosis present

## 2021-08-22 DIAGNOSIS — I5042 Chronic combined systolic (congestive) and diastolic (congestive) heart failure: Secondary | ICD-10-CM | POA: Diagnosis not present

## 2021-08-22 DIAGNOSIS — I110 Hypertensive heart disease with heart failure: Secondary | ICD-10-CM | POA: Diagnosis not present

## 2021-08-22 DIAGNOSIS — Z794 Long term (current) use of insulin: Secondary | ICD-10-CM | POA: Diagnosis not present

## 2021-08-22 DIAGNOSIS — Z8616 Personal history of COVID-19: Secondary | ICD-10-CM | POA: Diagnosis not present

## 2021-08-22 DIAGNOSIS — Z8249 Family history of ischemic heart disease and other diseases of the circulatory system: Secondary | ICD-10-CM | POA: Insufficient documentation

## 2021-08-22 DIAGNOSIS — Z87891 Personal history of nicotine dependence: Secondary | ICD-10-CM | POA: Insufficient documentation

## 2021-08-22 DIAGNOSIS — D6869 Other thrombophilia: Secondary | ICD-10-CM | POA: Diagnosis not present

## 2021-08-22 LAB — BASIC METABOLIC PANEL
Anion gap: 10 (ref 5–15)
BUN: 20 mg/dL (ref 8–23)
CO2: 28 mmol/L (ref 22–32)
Calcium: 8.5 mg/dL — ABNORMAL LOW (ref 8.9–10.3)
Chloride: 98 mmol/L (ref 98–111)
Creatinine, Ser: 1.19 mg/dL (ref 0.61–1.24)
GFR, Estimated: >60 mL/min (ref >60)
Glucose, Bld: 260 mg/dL — ABNORMAL HIGH (ref 70–99)
Potassium: 4.3 mmol/L (ref 3.5–5.1)
Sodium: 136 mmol/L (ref 135–145)

## 2021-08-22 LAB — CBC
HCT: 41.7 % (ref 39.0–52.0)
Hemoglobin: 12.9 g/dL — ABNORMAL LOW (ref 13.0–17.0)
MCH: 28 pg (ref 26.0–34.0)
MCHC: 30.9 g/dL (ref 30.0–36.0)
MCV: 90.5 fL (ref 80.0–100.0)
Platelets: 277 10*3/uL (ref 150–400)
RBC: 4.61 MIL/uL (ref 4.22–5.81)
RDW: 17.2 % — ABNORMAL HIGH (ref 11.5–15.5)
WBC: 11.4 10*3/uL — ABNORMAL HIGH (ref 4.0–10.5)
nRBC: 0 % (ref 0.0–0.2)

## 2021-08-22 NOTE — Progress Notes (Addendum)
Primary Care Physician: [name], MD Primary Cardiologist: Gwenlyn Found Primary Electrophysiologist: Genevie Ann is a 80 y.o. male with a history of persistent atrial fibrillation who presents for follow up in the [facility].  Since last being seen in clinic, the patient reports doing very well. afib burden low. No sustained AF. No bleeding issues on [place]. He did lose his wife unexpectedly a couple of months ago and is still grieving over this. He has chronic LLE, which he feels is at his baseline . He states that the legs go  down by the morning and fluid accumulates during the day.   F/u in afib clinic, 07/04/21. He underwent repeat ablation one month ago. He is in rate controlled afib. He states that he went to his PCP a few days after ablation and was in afib then and has persisted  since then. He feels like his weight is up 10 lbs, some from food consumption but feels his abdomen is swollen. He eats out almost every meal since his wife died. He wnet into persistent afib in the spring but was not ready for repeat ablation. He is not favorable to a cardioversion today. He feels very winded. +LLE. Previously failed tikosyn.   F/u in afib clinic, 07/12/21. He remains in rate controlled afib. He still does not want a cardioversion. I switched him to torsemide on last visit for some fluid/weight gain. Previously on 60 mg lasix daily. He states that it was expensive, his weight is up another 2 lbs and he feels that he did better on lasix. He states that his legs are much less swollen  in the am, and he feels good.   F/u in afib clinic 08/23/21. He had a successful cardioversion, 10/26 but presented later in the day to [facility] ER  with systolic CHF and admitted. He also  was found to be  Covid positive. He was initially in [place]. He was diuresed but returned to afib prior to d/c.he remains in afib rate controlled today.He appears normovolemic.  Today, he  denies symptoms of  palpitations, chest pain, +for shortness of breath,  and lower extremity edema, neg for dizziness, presyncope, syncope, snoring, daytime somnolence, bleeding, or neurologic sequela. The patient is tolerating medications without difficulties and is otherwise without complaint today.    Atrial Fibrillation Risk Factors:  he does have symptoms or diagnosis of sleep apnea. he is compliant with CPAP therapy.  he does not have a history of rheumatic fever.  he does not have a history of alcohol use.  he has a BMI of Body mass index is 35.35 kg/m.[name] Filed Weights   08/22/21 1404  Weight: 111.8 kg    LA size: 40   Atrial Fibrillation Management history:  Previous antiarrhythmic drugs: Tikosyn  Previous cardioversions: 2016   Previous ablations: 2016. 06/06/21  CHADS2VASC score: 5  Anticoagulation history: Eliquis   Past Medical History:  Diagnosis Date   Arthritis    Atrial fibrillation (HCC)    CAD (coronary artery disease)    a. Cath 03/17/15 showing 100% ostial D1, 50% prox LAD to mid LAD, 40% RPDA stenosis. Med rx. // Myoview 01/2020: EF 31 diffuse perfusion defect without reversibility (suspect artifact); reviewed with Dr. Aggie Cosier study felt to be low risk   Cataract    Mixed form OD   Chronic diastolic CHF (congestive heart failure) (HCC)    Diabetic retinopathy ([place])    NPDR OU   Essential hypertension  Glaucoma    POAG OU   History of gout    Hyperlipidemia    Hypertensive retinopathy    OU   Kidney stones    Melanoma of neck ([place])    NICM (nonischemic cardiomyopathy) ([place])    OSA on CPAP 2012   Prostate cancer ([place])    Type II diabetes mellitus ([place])    Past Surgical History:  Procedure Laterality Date   ABDOMINAL HERNIA REPAIR     w/mesh   ATRIAL FIBRILLATION ABLATION N/A 06/06/2021   Procedure: ATRIAL FIBRILLATION ABLATION;  Surgeon: Thompson Grayer, MD;  Location: [place] [facility];  Service: Cardiovascular;  Laterality: N/A;   CARDIAC  CATHETERIZATION N/A 03/17/2015   Procedure: Left Heart Cath and Coronary Angiography;  Surgeon: Lorretta Harp, MD;  Location: [place] [facility];  Service: Cardiovascular;  Laterality: N/A;   CARDIOVERSION N/A 08/09/2021   Procedure: CARDIOVERSION;  Surgeon: [name], MD;  Location: [place];  Service: Cardiovascular;  Laterality: N/A;   carotid doppler  09/17/2008   rigt and left ICAs 0-49%;mildly  abnormal   CATARACT EXTRACTION Left 2020   Dr. Kathlen Mody   COLONOSCOPY N/A 05/16/2017   Procedure: COLONOSCOPY;  Surgeon: Rogene Houston, MD;  [facility];  Service: Endoscopy;  Laterality: N/A;  [place]  05/25/2009   EF 50-55%,LA mildly dilated, LV function normal   ELECTROPHYSIOLOGIC STUDY N/A 04/05/2015   Procedure: Cardioversion;  Surgeon: Sanda Klein, MD;  Location: [place] [facility];  Service: Cardiovascular;  Laterality: N/A;   ELECTROPHYSIOLOGIC STUDY N/A 09/06/2015   Procedure: Atrial Fibrillation Ablation;  Surgeon: Thompson Grayer, MD;  Location: [place] [facility];  Service: Cardiovascular;  Laterality: N/A;   ELECTROPHYSIOLOGIC STUDY N/A 07/12/2016   redo afib ablation by Dr Rayann Heman   EXCISIONAL HEMORRHOIDECTOMY     "inside and out"   EYE SURGERY Left 2020   Cat Sx - Dr. Kathlen Mody   FINE NEEDLE ASPIRATION Right    knee; "drew ~ 1 quart off"   [name] Right    "neck"   NM MYOCAR PERF WALL MOTION  02/21/2012   EF 61% ,EXERCISE 7 METS. exercise stopped due to wheezing and shortness of breathe   POLYPECTOMY  05/16/2017   Procedure: POLYPECTOMY;  Surgeon: Rogene Houston, MD;  [facility];  Service: Endoscopy;;  colon   PROSTATECTOMY     SHOULDER OPEN ROTATOR CUFF REPAIR Right X 2   TEE WITHOUT CARDIOVERSION N/A 09/05/2015   Procedure: TRANSESOPHAGEAL ECHOCARDIOGRAM (TEE);  Surgeon: Sueanne Margarita, MD;  Location[facility]OSCOPY;  Service: Cardiovascular;  Laterality:  N/A;    Current Outpatient Medications  Medication Sig Dispense Refill   amLODipine (NORVASC) 10 MG tablet Take 1 tablet (10 mg total) by mouth at bedtime.     apixaban (ELIQUIS) 5 MG TABS tablet Take 1 tablet (5 mg total) by mouth 2 (two) times daily. 180 tablet 1   benzonatate (TESSALON) 200 MG capsule Take 1 capsule (200 mg total) by mouth 3 (three) times daily as needed for cough. 20 capsule 0   carvedilol (COREG) 6.25 MG tablet Takes 1 tablet by mouth in the AM and 1 tablet by mouth in the PM     diphenhydramine-acetaminophen (TYLENOL PM) 25-500 MG TABS tablet Take 2 tablets by mouth at bedtime.     Docusate Calcium (STOOL SOFTENER PO) Take 100 mg by mouth every evening.  dorzolamide-timolol (COSOPT) 22.3-6.8 MG/ML ophthalmic solution Place 1 drop into both eyes 2 (two) times daily. (Patient taking differently: Place 1 drop into both eyes at bedtime.) 10 mL 6   furosemide (LASIX) 40 MG tablet Take 1 tablet (40 mg total) by mouth 2 (two) times daily. 60 tablet 2   gemfibrozil (LOPID) 600 MG tablet Take 600 mg by mouth at bedtime.      glimepiride (AMARYL) 2 MG tablet Take 2 mg by mouth 2 (two) times daily.      Insulin Glargine (BASAGLAR KWIKPEN) 100 UNIT/ML Inject 35 Units into the skin at bedtime. 40 mL    lisinopril (ZESTRIL) 40 MG tablet Take 40 mg by mouth daily.     Melatonin 10 MG TABS Take 10 mg by mouth at bedtime.     metFORMIN (GLUCOPHAGE-XR) 500 MG 24 hr tablet Take 1,000 mg by mouth in the morning and at bedtime.     mineral oil liquid Take 15 mLs by mouth daily as needed for moderate constipation.     Multiple Vitamins-Minerals (MULTIVITAMIN WITH MINERALS) tablet Take 1 tablet by mouth daily. Centrum men 50+     Omega-3 1000 MG CAPS Take 1,000 mg by mouth daily.     potassium chloride SA (KLOR-CON M20) 20 MEQ tablet Take 1 tablet (20 mEq total) by mouth daily. 30 tablet 3   prednisoLONE acetate (PRED FORTE) 1 % ophthalmic suspension Place 1 drop into the left eye at  bedtime.     valACYclovir (VALTREX) 1000 MG tablet Take 1,000 mg by mouth at bedtime.     No current facility-administered medications for this encounter.    Allergies  Allergen Reactions   Tape Rash and Other (See Comments)    Causes skin redness, Use paper tape only.    Social History   Socioeconomic History   Marital status: Married    Spouse name: Not on file   Number of children: Not on file   Years of education: Not on file   Highest education level: Not on file  Occupational History   Occupation: Retired  Tobacco Use   Smoking status: Former    Packs/day: 2.00    Years: 27.00    Pack years: 54.00    Types: Cigarettes    Quit date: 10/31/1983    Years since quitting: 37.8   Smokeless tobacco: Never  Vaping Use   Vaping Use: Never used  Substance and Sexual Activity   Alcohol use: No    Alcohol/week: 0.0 standard drinks    Comment: "used to drink; stopped ~ 2008"   Drug use: No   Sexual activity: Not on file  Other Topics Concern   Not on file  Social History Narrative   Lives in [place], [place] with wife.   Social Determinants of Health   Financial Resource Strain: Not on file  Food Insecurity: Not on file  Transportation Needs: Not on file  Physical Activity: Not on file  Stress: Not on file  Social Connections: Not on file  Intimate Partner Violence: Not on file    Family History  Problem Relation Age of Onset   Heart disease Mother    Lung cancer Mother    Heart attack Mother 27   Stroke Brother    Healthy Daughter    Diabetes Father    Heart disease Father     ROS- All systems are reviewed and negative except as per the HPI above.  Physical Exam: Vitals:   08/22/21 1404  Weight: 111.8  kg  Height: 5\' 10"  (1.778 m)    GEN- The patient is well appearing, alert and oriented x 3 today.   Head- normocephalic, atraumatic Eyes-  Sclera clear, conjunctiva pink Ears- hearing intact Oropharynx- clear Neck- supple  Lungs- Clear to ausculation  bilaterally, normal work of breathing Heart- irregular rate and rhythm  GI- soft, NT, ND, + BS Extremities- no clubbing, cyanosis, or  2+ edema  MS- no significant deformity or atrophy Skin- no rash or lesion Psych- euthymic mood, full affect Neuro- strength and sensation are intact  Wt Readings from Last 3 Encounters:  08/22/21 111.8 kg  08/15/21 110.4 kg  08/09/21 117.5 kg    EKG today demonstrates  afib at 69 bpm,  qrs int 104 ms, qtc 417  ms  Epic records are reviewed at length today  Assessment and Plan:  1. Persistent atrial fibrillation  S/p ablation August 2022  and SR only lasted a few days He  had a  successful cardioversion and had issues with fluid overload and shortness of breath within a few hours  Was admitted with CHF and  for diuresis same day of cardioversion  Discharged in afib rate controlled  Discussed antiarrythmic's today, Tikosyn vrs amiodarone, he failed Tikosyn several years ago,and now that he has had an ablation, may work better, but he does not want hospitalization to get on drug. With his  h/o HF, it would benefit him to be able to maintain SR.  I will discuss further with Dr. Rayann Heman later today and let pt know tomorrow his preference.  Continue Eliquis 5 mg bid  for CHADS2VASC of 5 Bmet/cbc today as f/u recent hospital stay  2. HTN Stable   3. Obstructive sleep apnea The patient reports compliance with CPAP   4.  CHF exacerbation with  acute respiratory failure with hypoxia    Recent admission with systolic HF, diuresed 56+ pounds   I will f/u with pt to further discuss plan to resume SR   Addendum-08/23/21- I discussed with Dr. Rayann Heman and he would prefer amiodarone with tikosyn in the past failing to maintain SR. I will call pt and discuss starting the drug. Risk vrs benefit explained. Pt wants to start amiodarone 200 mg bid the Monday after Thanksgiving to allow him to recover from Covid a while longer. I will see back on 12/4 with EKG on  amiodarone 200 mg bid.  Geroge Baseman Neko Mcgeehan, [facility] [address] [place], [place] [address] [phone]

## 2021-08-23 NOTE — Addendum Note (Signed)
Encounter addended by: Sherran Needs, NP on: 08/23/2021 8:44 AM  Actions taken: Clinical Note Signed

## 2021-08-24 ENCOUNTER — Other Ambulatory Visit (HOSPITAL_COMMUNITY): Payer: Self-pay | Admitting: *Deleted

## 2021-08-24 MED ORDER — AMIODARONE HCL 200 MG PO TABS: ORAL_TABLET | ORAL | 0 refills | Status: DC

## 2021-08-24 NOTE — Addendum Note (Signed)
Encounter addended by: Sherran Needs, NP on: 08/24/2021 10:42 AM  Actions taken: Clinical Note Signed

## 2021-08-31 NOTE — Progress Notes (Signed)
[facility] Note  09/01/2021     CHIEF COMPLAINT Patient presents for Retina Follow Up   HISTORY OF PRESENT ILLNESS: Lucas Dudley is a 80 y.o. male who presents to the clinic today for:  HPI     Retina Follow Up   Patient presents with  Diabetic Retinopathy.  In both eyes.  This started 4 weeks ago.  I, the attending physician,  performed the HPI with the patient and updated documentation appropriately.        Comments   Patient here for 4 weeks retina follow up for NPDR OU.  Patient states vision a little better. Has new glasses. No eye pain. Lost 20 lbs of fluid. Was in hospital for 1 week. Has a new pressure drop. Name ? BID OU.      Last edited by Bernarda Caffey, MD on 09/01/2021 11:56 PM.    Pt had a cardioversion on 10.26.22 and then had to go back to the ED the same day for CHF, pt spent a week in the hospital and had 20lbs of fluid removed  Referring physician: [name], MD [place],  [place] [address]  HISTORICAL INFORMATION:   Selected notes from the MEDICAL RECORD NUMBER Referred by Dr. Kathlen Mody for DEE   CURRENT MEDICATIONS: Current Outpatient Medications (Ophthalmic Drugs)  Medication Sig   dorzolamide-timolol (COSOPT) 22.3-6.8 MG/ML ophthalmic solution Place 1 drop into both eyes 2 (two) times daily. (Patient taking differently: Place 1 drop into both eyes at bedtime.)   prednisoLONE acetate (PRED FORTE) 1 % ophthalmic suspension Place 1 drop into the left eye at bedtime. (Patient not taking: Reported on 09/01/2021)   No current facility-administered medications for this visit. (Ophthalmic Drugs)   Current Outpatient Medications (Other)  Medication Sig   amiodarone (PACERONE) 200 MG tablet Take 1 tablet by mouth twice a day for 1 month then reduce to 1 tablet daily   amLODipine (NORVASC) 10 MG tablet Take 1 tablet (10 mg total) by mouth at bedtime.   apixaban (ELIQUIS) 5 MG TABS tablet Take 1 tablet (5 mg  total) by mouth 2 (two) times daily.   carvedilol (COREG) 6.25 MG tablet Takes 1 tablet by mouth in the AM and 1 tablet by mouth in the PM   diphenhydramine-acetaminophen (TYLENOL PM) 25-500 MG TABS tablet Take 2 tablets by mouth at bedtime.   Docusate Calcium (STOOL SOFTENER PO) Take 100 mg by mouth every evening.   furosemide (LASIX) 40 MG tablet Take 1 tablet (40 mg total) by mouth 2 (two) times daily.   gemfibrozil (LOPID) 600 MG tablet Take 600 mg by mouth at bedtime.    glimepiride (AMARYL) 2 MG tablet Take 2 mg by mouth 2 (two) times daily.    Insulin Glargine (BASAGLAR KWIKPEN) 100 UNIT/ML Inject 35 Units into the skin at bedtime.   lisinopril (ZESTRIL) 40 MG tablet Take 40 mg by mouth daily.   Melatonin 10 MG TABS Take 10 mg by mouth at bedtime.   metFORMIN (GLUCOPHAGE-XR) 500 MG 24 hr tablet Take 1,000 mg by mouth in the morning and at bedtime.   mineral oil liquid Take 15 mLs by mouth daily as needed for moderate constipation.   Multiple Vitamins-Minerals (MULTIVITAMIN WITH MINERALS) tablet Take 1 tablet by mouth daily. Centrum men 50+   Omega-3 1000 MG CAPS Take 1,000 mg by mouth daily.   potassium chloride SA (KLOR-CON M20) 20 MEQ tablet Take 1 tablet (20 mEq total) by  mouth daily.   valACYclovir (VALTREX) 1000 MG tablet Take 1,000 mg by mouth at bedtime.   benzonatate (TESSALON) 200 MG capsule Take 1 capsule (200 mg total) by mouth 3 (three) times daily as needed for cough. (Patient not taking: Reported on 09/01/2021)   No current facility-administered medications for this visit. (Other)   REVIEW OF SYSTEMS: ROS   Positive for: Gastrointestinal, Endocrine, Cardiovascular, Eyes, Respiratory Negative for: Constitutional, Neurological, Skin, Genitourinary, Musculoskeletal, HENT, Psychiatric, Allergic/Imm, Heme/Lymph Last edited by Theodore Demark, COA on 09/01/2021  2:11 PM.      ALLERGIES Allergies  Allergen Reactions   Tape Rash and Other (See Comments)    Causes skin  redness, Use paper tape only.   PAST MEDICAL HISTORY Past Medical History:  Diagnosis Date   Arthritis    Atrial fibrillation (HCC)    CAD (coronary artery disease)    a. Cath 03/17/15 showing 100% ostial D1, 50% prox LAD to mid LAD, 40% RPDA stenosis. Med rx. // Myoview 01/2020: EF 31 diffuse perfusion defect without reversibility (suspect artifact); reviewed with Dr. Aggie Cosier study felt to be low risk   Cataract    Mixed form OD   Chronic diastolic CHF (congestive heart failure) (HCC)    Diabetic retinopathy ([place])    NPDR OU   Essential hypertension    Glaucoma    POAG OU   History of gout    Hyperlipidemia    Hypertensive retinopathy    OU   Kidney stones    Melanoma of neck (HCC)    NICM (nonischemic cardiomyopathy) ([place])    OSA on CPAP 2012   Prostate cancer ([place])    Type II diabetes mellitus ([place])    Past Surgical History:  Procedure Laterality Date   ABDOMINAL HERNIA REPAIR     w/mesh   ATRIAL FIBRILLATION ABLATION N/A 06/06/2021   Procedure: ATRIAL FIBRILLATION ABLATION;  Surgeon: Thompson Grayer, MD;  Location: [facility];  Service: Cardiovascular;  Laterality: N/A;   CARDIAC CATHETERIZATION N/A 03/17/2015   Procedure: Left Heart Cath and Coronary Angiography;  Surgeon: Lorretta Harp, MD;  Location: [facility];  Service: Cardiovascular;  Laterality: N/A;   CARDIOVERSION N/A 08/09/2021   Procedure: CARDIOVERSION;  Surgeon: [name], MD;  Location: [facility];  Service: Cardiovascular;  Laterality: N/A;   carotid doppler  09/17/2008   rigt and left ICAs 0-49%;mildly  abnormal   CATARACT EXTRACTION Left 2020   Dr. Kathlen Mody   COLONOSCOPY N/A 05/16/2017   Procedure: COLONOSCOPY;  Surgeon: Rogene Houston, MD;  [facility];  Service: Endoscopy;  Laterality: N/A;  [place]  05/25/2009   EF 50-55%,LA mildly dilated, LV function normal   ELECTROPHYSIOLOGIC STUDY N/A 04/05/2015   Procedure: Cardioversion;   Surgeon: Sanda Klein, MD;  Location: [facility];  Service: Cardiovascular;  Laterality: N/A;   ELECTROPHYSIOLOGIC STUDY N/A 09/06/2015   Procedure: Atrial Fibrillation Ablation;  Surgeon: Thompson Grayer, MD;  Location: [facility];  Service: Cardiovascular;  Laterality: N/A;   ELECTROPHYSIOLOGIC STUDY N/A 07/12/2016   [facility] afib ablation by Dr Rayann Heman   EXCISIONAL HEMORRHOIDECTOMY     "inside and out"   EYE SURGERY Left 2020   Cat Sx - Dr. Kathlen Mody   FINE NEEDLE ASPIRATION Right    knee; "drew ~ 1 quart off"   HERNIA REPAIR     LAPAROSCOPIC CHOLECYSTECTOMY     MELANOMA EXCISION Right    "neck"   NM MYOCAR  PERF WALL MOTION  02/21/2012   EF 61% ,EXERCISE 7 METS. exercise stopped due to wheezing and shortness of breathe   POLYPECTOMY  05/16/2017   Procedure: POLYPECTOMY;  Surgeon: Rogene Houston, MD;  [facility];  Service: Endoscopy;;  colon   PROSTATECTOMY     SHOULDER OPEN ROTATOR CUFF REPAIR Right X 2   TEE WITHOUT CARDIOVERSION N/A 09/05/2015   Procedure: TRANSESOPHAGEAL ECHOCARDIOGRAM (TEE);  Surgeon: Sueanne Margarita, MD;  [facility];  Service: Cardiovascular;  Laterality: N/A;   FAMILY HISTORY Family History  Problem Relation Age of Onset   Heart disease Mother    Lung cancer Mother    Heart attack Mother 35   Stroke Brother    Healthy Daughter    Diabetes Father    Heart disease Father    SOCIAL HISTORY Social History   Tobacco Use   Smoking status: Former    Packs/day: 2.00    Years: 27.00    Pack years: 54.00    Types: Cigarettes    Quit date: 10/31/1983    Years since quitting: 37.8   Smokeless tobacco: Never  Vaping Use   Vaping Use: Never used  Substance Use Topics   Alcohol use: No    Alcohol/week: 0.0 standard drinks    Comment: "used to drink; stopped ~ 2008"   Drug use: No       OPHTHALMIC EXAM: Base Eye Exam     Visual Acuity (Snellen - Linear)       Right Left   Dist cc 20/60 -2 20/100   Dist ph cc 20/30  -1 20/80    Correction: Glasses         Tonometry (Tonopen, 2:08 PM)       Right Left   Pressure 21 16         Pupils       Dark Light Shape React APD   Right 2 1 Round Minimal None   Left 2 1 Round Minimal None         Visual Fields (Counting fingers)       Left Right    Full Full         Extraocular Movement       Right Left    Full, Ortho Full, Ortho         Neuro/Psych     Oriented x3: Yes   Mood/Affect: Normal         Dilation     Both eyes: 1.0% Mydriacyl, 2.5% Phenylephrine @ 2:08 PM           Slit Lamp and Fundus Exam     Slit Lamp Exam       Right Left   Lids/Lashes Dermato, mild MGD Dermato, mild MGD   Conjunctiva/Sclera Temporal pinguecula Temporal pinguecula   Cornea EBMD, mild haze, trace PEE, mild Debris in tear film EBMD, mild haze, 1+ PEE, well healed temporal cataract wounds, arcus   Anterior Chamber Deep and clear; narrow temporal angle Deep and clear   Iris Round and dilated, mild anterior bowing, No NVI Round and moderately dilated to 5.12mm   Lens PCIOL in good position PCIOL, 1+ PCO - non central   Anterior Vitreous Synerisis Synerisis         Fundus Exam       Right Left   Disc Mild pallor, sharp, +cupping, thin inferior rim Mild pallor and sharp, +cupping, +PPA   C/D Ratio 0.7 0.6   Macula Flat,  blunted foveal reflex, trace central cystic changes, RPE mottling and clumping, no heme Flat, blunted foveal reflex, clusters of fine MA nasal and superior to fovea--improved, +cystic changes -- slightly improved, focal exudates superior to fovea - improving   Vessels attenuated, Tortuous Attenuated, mild av crossing changes, mild tortuousity   Periphery Attached, rare MA Attached. No heme.           Refraction     Wearing Rx       Sphere Cylinder Axis Add   Right -0.25 +2.00 158 +2.50   Left -1.00 +1.25 008 +2.50           IMAGING AND PROCEDURES  Imaging and Procedures for 09/01/2021  OCT, Retina -  OU - Both Eyes       Right Eye Quality was good. Central Foveal Thickness: 301. Progression has been stable. Findings include abnormal foveal contour, no SRF, vitreomacular adhesion , intraretinal fluid (Persistent cystic changes superior to fovea).   Left Eye Quality was good. Central Foveal Thickness: 341. Progression has improved. Findings include abnormal foveal contour, intraretinal fluid, intraretinal hyper-reflective material (Persistent IRF/IRHM - slightly improved; partial PVD).   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 CME - cystoid macular edema. PED - pigment epithelial detachment. IRF - intraretinal fluid. SRF - subretinal fluid. EZ - ellipsoid zone. ERM - epiretinal membrane. ORA - outer retinal atrophy. ORT - outer retinal tubulation. SRHM - subretinal hyper-reflective material. IRHM - intraretinal hyper-reflective material     Intravitreal Injection, Pharmacologic Agent - OS - Left Eye       Time Out 09/01/2021. 2:34 PM. Confirmed correct patient, procedure, site, and patient consented.   Anesthesia Topical anesthesia was used. Anesthetic medications included Lidocaine 2%, Proparacaine 0.5%.   Procedure Preparation included 5% betadine to ocular surface, eyelid speculum. A (32g) needle was used.   Injection: 2 mg aflibercept 2 MG/0.05ML   Route: Intravitreal, Site: Left Eye   NDC: [id number], Lot: [id number], Expiration date: 07/14/2022, Waste: 0.05 mL   Post-op Post injection exam found visual acuity of at least counting fingers. The patient tolerated the procedure well. There were no complications. The patient received written and verbal post procedure care education. Post injection medications were not given.             ASSESSMENT/PLAN:   ICD-10-CM   1. 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, associated with type 2 diabetes mellitus (HCC)  E72.0947 Intravitreal Injection, Pharmacologic Agent - OS - Left Eye    aflibercept (EYLEA) SOLN 2 mg    2. Retinal edema  H35.81 OCT, Retina - OU - Both Eyes    3. Essential hypertension  I10     4. Hypertensive retinopathy of both eyes  H35.033     5. Pseudophakia of both eyes  Z96.1     6. 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 History of herpes zoster of eye  Z86.19     8. Primary open angle glaucoma of both eyes, unspecified glaucoma stage  H40.1130      1,2. 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 Single focal MA superior to fovea.  OS: Perifoveal Mas w/late leakage - OCT shows diabetic macular edema OU (OS>OD) - s/p IVA OS # 1(07.26.21), #2 (08.24.21), #3 (09.21.21), #4 (10.22.21) -- IVA resistance - s/p IVE OS #1 (11.22.21), #2 (12.21.21), #3 (01.20.22), #4 (02.18.22), #5 (03.18.22), #6 (04.15.22), #7 (05.10.22) -- sample, #8 (06.08.22), #9 (07.12.22), #10 (08.12.22), #11 (09.16.22), #12 (10.21.22) - BCVA OS 20/80  - OCT shows  OD: Persistent cystic changes; OS: Mild interval improvement in IRF/cystic changes; partial PVD - recommend IVE OS #13 today, 11.18.22 - pt wishes to proceed with injection - RBA of procedure discussed, questions answered - IVA informed consent obtained and signed, 07.26.21 (OS) - IVE informed consent obtained and signed, 11.22.21 (OS) - see procedure note - Good Days approved Eylea until 10/14/2021. - [employer]BS approved Eylea until 09/04/2021 - f/u in 4-5 wks -- DFE/OCT, possible injection  3,4. Hypertensive retinopathy OU - discussed importance of tight BP control - monitor   5. Pseudophakia OU  - s/p CE/IOL OS (2020, Dr. Kathlen Mody)             - s/p CE/IOL OD (2022, Dr.  Kathlen Mody)  - IOLs in good position, doing well  - monitor   6. 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 Susa Simmonds in August 2022  - OS to undergo SuperK on September 25, 2021  7. History of herpes zoster/iritis OS  - on valrex 1g daily---maintenance   8. POAG OU  - under the expert management of Dr. Kathlen Mody  - IOP 21,16 today  - currently on cosopt BID OU  Ophthalmic Meds Ordered this visit:  Meds ordered this encounter  Medications   aflibercept (EYLEA) SOLN 2 mg     Return for f/u 4-5 weeks, NPDR OU, DFE, OCT.  There are no Patient Instructions on file for this visit.  This document serves as a record of services personally performed by Gardiner Sleeper, MD, PhD. It was created on their behalf by Leonie Douglas, an ophthalmic technician. The creation of this record is the provider's dictation and/or activities during the visit.    Electronically signed by: Leonie Douglas COA, 09/02/21  12:07 AM  This document serves as a record of services personally performed by Gardiner Sleeper, MD, PhD. It was created on their behalf by San Jetty. Owens Shark, OA an ophthalmic technician. The creation of this record is the provider's dictation and/or activities during the visit.    Electronically signed by: San Jetty. Owens Shark, [place] 11.18.2022 12:07 AM  Gardiner Sleeper, M.D., Ph.D. Diseases & Surgery of the Retina and [place] 09/01/2021  I have reviewed the above documentation for accuracy and completeness, and I agree with the above. Gardiner Sleeper, M.D., Ph.D. 09/02/21 12:07 AM   Abbreviations: M myopia (nearsighted); A astigmatism; H hyperopia (farsighted); P presbyopia; Mrx spectacle prescription;  CTL contact lenses; OD right eye; OS left eye; OU both eyes  XT exotropia; ET esotropia; PEK punctate epithelial keratitis; PEE punctate epithelial erosions; DES dry eye syndrome; MGD meibomian gland dysfunction; ATs artificial tears; PFAT's preservative free artificial  tears; [place] nuclear sclerotic cataract; PSC posterior subcapsular cataract; ERM epi-retinal membrane; PVD posterior vitreous detachment; RD retinal detachment; DM diabetes mellitus; DR diabetic retinopathy; NPDR non-proliferative diabetic retinopathy; PDR proliferative diabetic retinopathy; CSME clinically significant macular edema; DME diabetic macular edema; dbh dot blot hemorrhages; CWS cotton wool spot; POAG primary open angle glaucoma; C/D cup-to-disc ratio; HVF humphrey visual field; GVF goldmann visual field; OCT  optical coherence tomography; IOP intraocular pressure; BRVO Branch retinal vein occlusion; CRVO central retinal vein occlusion; CRAO central retinal artery occlusion; BRAO branch retinal artery occlusion; RT retinal tear; SB scleral buckle; PPV pars plana vitrectomy; VH Vitreous hemorrhage; PRP panretinal laser photocoagulation; IVK intravitreal kenalog; VMT vitreomacular traction; MH Macular hole;  NVD neovascularization of the disc; NVE neovascularization elsewhere; AREDS age related eye disease study; ARMD age related macular degeneration; POAG primary open angle glaucoma; EBMD epithelial/anterior basement membrane dystrophy; ACIOL anterior chamber intraocular lens; IOL intraocular lens; PCIOL posterior chamber intraocular lens; Phaco/IOL phacoemulsification with intraocular lens placement; [place] photorefractive keratectomy; LASIK laser assisted in situ keratomileusis; HTN hypertension; DM diabetes mellitus; COPD chronic obstructive pulmonary disease

## 2021-09-01 ENCOUNTER — Ambulatory Visit (INDEPENDENT_AMBULATORY_CARE_PROVIDER_SITE_OTHER): Payer: Medicare Other | Admitting: Ophthalmology

## 2021-09-01 ENCOUNTER — Encounter (INDEPENDENT_AMBULATORY_CARE_PROVIDER_SITE_OTHER): Payer: Self-pay | Admitting: Ophthalmology

## 2021-09-01 ENCOUNTER — Other Ambulatory Visit: Payer: Self-pay

## 2021-09-01 DIAGNOSIS — H35033 Hypertensive retinopathy, bilateral: Secondary | ICD-10-CM

## 2021-09-01 DIAGNOSIS — E113213 Type 2 diabetes mellitus with mild nonproliferative diabetic retinopathy with macular edema, bilateral: Secondary | ICD-10-CM

## 2021-09-01 DIAGNOSIS — H401130 Primary open-angle glaucoma, bilateral, stage unspecified: Secondary | ICD-10-CM

## 2021-09-01 DIAGNOSIS — Z961 Presence of intraocular lens: Secondary | ICD-10-CM | POA: Diagnosis not present

## 2021-09-01 DIAGNOSIS — H3581 Retinal edema: Secondary | ICD-10-CM

## 2021-09-01 DIAGNOSIS — H18523 Epithelial (juvenile) corneal dystrophy, bilateral: Secondary | ICD-10-CM

## 2021-09-01 DIAGNOSIS — I10 Essential (primary) hypertension: Secondary | ICD-10-CM | POA: Diagnosis not present

## 2021-09-01 DIAGNOSIS — Z8619 Personal history of other infectious and parasitic diseases: Secondary | ICD-10-CM

## 2021-09-01 MED ORDER — AFLIBERCEPT 2MG/0.05ML IZ SOLN FOR KALEIDOSCOPE
2.0000 mg | INTRAVITREAL | Status: AC | PRN
  Administered 2021-09-01: 2 mg via INTRAVITREAL

## 2021-09-06 ENCOUNTER — Ambulatory Visit: Payer: Medicare Other | Admitting: Internal Medicine

## 2021-09-18 ENCOUNTER — Other Ambulatory Visit: Payer: Self-pay

## 2021-09-18 ENCOUNTER — Ambulatory Visit (HOSPITAL_COMMUNITY)
Admission: RE | Admit: 2021-09-18 | Discharge: 2021-09-18 | Disposition: A | Discharge status: Home / Self Care | Payer: Medicare Other | Source: Ambulatory Visit | Attending: Physician Assistant | Admitting: Physician Assistant

## 2021-09-18 DIAGNOSIS — D6869 Other thrombophilia: Secondary | ICD-10-CM | POA: Diagnosis not present

## 2021-09-18 DIAGNOSIS — I4819 Other persistent atrial fibrillation: Secondary | ICD-10-CM | POA: Diagnosis present

## 2021-09-18 MED ORDER — AMIODARONE HCL 200 MG PO TABS: 200.0000 mg | ORAL_TABLET | Freq: Every day | ORAL | 0 refills | Status: DC

## 2021-09-18 NOTE — Patient Instructions (Signed)
Decrease amiodarone to 200mg once a day 

## 2021-09-18 NOTE — Progress Notes (Signed)
Patient returns for ECG after starting amiodarone. ECG shows afib HR 80, PVC, QRS 104, QTc 438. We discussed DCCV to restore SR. Patient hesitant to schedule at this time. He would like time to think about his options. Will decrease amiodarone to 200 mg daily. F/u in the AF clinic in 1-2 weeks to revisit DCCV.

## 2021-09-24 ENCOUNTER — Other Ambulatory Visit (HOSPITAL_COMMUNITY): Payer: Self-pay | Admitting: Nurse Practitioner

## 2021-09-27 ENCOUNTER — Ambulatory Visit (HOSPITAL_COMMUNITY)
Admission: RE | Admit: 2021-09-27 | Discharge: 2021-09-27 | Disposition: A | Discharge status: Home / Self Care | Payer: Medicare Other | Source: Ambulatory Visit | Attending: Nurse Practitioner | Admitting: Nurse Practitioner

## 2021-09-27 ENCOUNTER — Other Ambulatory Visit: Payer: Self-pay | Admitting: Physician Assistant

## 2021-09-27 ENCOUNTER — Encounter (HOSPITAL_COMMUNITY): Payer: Self-pay | Admitting: Nurse Practitioner

## 2021-09-27 ENCOUNTER — Other Ambulatory Visit: Payer: Self-pay

## 2021-09-27 VITALS — BP 150/78 | HR 78 | Ht 70.0 in | Wt 244.0 lb

## 2021-09-27 DIAGNOSIS — G4733 Obstructive sleep apnea (adult) (pediatric): Secondary | ICD-10-CM | POA: Diagnosis not present

## 2021-09-27 DIAGNOSIS — I110 Hypertensive heart disease with heart failure: Secondary | ICD-10-CM | POA: Diagnosis not present

## 2021-09-27 DIAGNOSIS — Z79899 Other long term (current) drug therapy: Secondary | ICD-10-CM | POA: Insufficient documentation

## 2021-09-27 DIAGNOSIS — I4892 Unspecified atrial flutter: Secondary | ICD-10-CM

## 2021-09-27 DIAGNOSIS — I4819 Other persistent atrial fibrillation: Secondary | ICD-10-CM | POA: Diagnosis not present

## 2021-09-27 DIAGNOSIS — I5040 Unspecified combined systolic (congestive) and diastolic (congestive) heart failure: Secondary | ICD-10-CM | POA: Diagnosis not present

## 2021-09-27 DIAGNOSIS — D6869 Other thrombophilia: Secondary | ICD-10-CM

## 2021-09-27 DIAGNOSIS — Z7901 Long term (current) use of anticoagulants: Secondary | ICD-10-CM | POA: Insufficient documentation

## 2021-09-27 NOTE — Progress Notes (Signed)
Primary Care Physician: [name], MD Primary Cardiologist: Gwenlyn Found Primary Electrophysiologist: Genevie Ann is a 80 y.o. male with a history of persistent atrial fibrillation who presents for follow up in the [facility].  Since last being seen in clinic, the patient reports doing very well. afib burden low. No sustained AF. No bleeding issues on [place]. He did lose his wife unexpectedly a couple of months ago and is still grieving over this. He has chronic LLE, which he feels is at his baseline . He states that the legs go  down by the morning and fluid accumulates during the day.   F/u in afib clinic, 07/04/21. He underwent repeat ablation one month ago. He is in rate controlled afib. He states that he went to his PCP a few days after ablation and was in afib then and has persisted  since then. He feels like his weight is up 10 lbs, some from food consumption but feels his abdomen is swollen. He eats out almost every meal since his wife died. He wnet into persistent afib in the spring but was not ready for repeat ablation. He is not favorable to a cardioversion today. He feels very winded. +LLE. Previously failed tikosyn.   F/u in afib clinic, 07/12/21. He remains in rate controlled afib. He still does not want a cardioversion. I switched him to torsemide on last visit for some fluid/weight gain. Previously;y on 60 mg lasix daily. He states that it was expensive, his weight is up another 2 lbs and he feels that he did better on lasix. He states that his legs are much less swollen  in the am, and he feels good.   F/u in afib clinic, 12/14. He has been loading on amiodarone for one month and is ready for cardioversion. Inially, he said he did  not want another cardioversion but explained to pt that amiodarone is not achieving full effectiveness to restore SR  unless followed by cardioversion or else stop the drug. He is agreeable now to pursue cardioversion. No  missed anticoagulation. His fluid weight is staying normal.   Today, he  denies symptoms of palpitations, chest pain, +for shortness of breath,  and lower extremity edema, neg for dizziness, presyncope, syncope, snoring, daytime somnolence, bleeding, or neurologic sequela. The patient is tolerating medications without difficulties and is otherwise without complaint today.    Atrial Fibrillation Risk Factors:  he does have symptoms or diagnosis of sleep apnea. he is compliant with CPAP therapy.  he does not have a history of rheumatic fever.  he does not have a history of alcohol use.  he has a BMI of Body mass index is 35.01 kg/m.[name] Filed Weights   09/27/21 1345  Weight: 110.7 kg    LA size: 40   Atrial Fibrillation Management history:  Previous antiarrhythmic drugs: Tikosyn  Previous cardioversions: 2016   Previous ablations: 2016. 06/06/21  CHADS2VASC score: 5  Anticoagulation history: Eliquis   Past Medical History:  Diagnosis Date   Arthritis    Atrial fibrillation (HCC)    CAD (coronary artery disease)    a. Cath 03/17/15 showing 100% ostial D1, 50% prox LAD to mid LAD, 40% RPDA stenosis. Med rx. // Myoview 01/2020: EF 31 diffuse perfusion defect without reversibility (suspect artifact); reviewed with Dr. Aggie Cosier study felt to be low risk   Cataract    Mixed form OD   Chronic diastolic CHF (congestive heart failure) (HCC)    Diabetic retinopathy (  [place])    NPDR OU   Essential hypertension    Glaucoma    POAG OU   History of gout    Hyperlipidemia    Hypertensive retinopathy    OU   Kidney stones    Melanoma of neck (HCC)    NICM (nonischemic cardiomyopathy) ([place])    OSA on CPAP 2012   Prostate cancer ([place])    Type II diabetes mellitus ([place])    Past Surgical History:  Procedure Laterality Date   ABDOMINAL HERNIA REPAIR     w/mesh   ATRIAL FIBRILLATION ABLATION N/A 06/06/2021   Procedure: ATRIAL FIBRILLATION ABLATION;  Surgeon: Thompson Grayer, MD;   Location: [facility];  Service: Cardiovascular;  Laterality: N/A;   CARDIAC CATHETERIZATION N/A 03/17/2015   Procedure: Left Heart Cath and Coronary Angiography;  Surgeon: Lorretta Harp, MD;  Location: [facility];  Service: Cardiovascular;  Laterality: N/A;   CARDIOVERSION N/A 08/09/2021   Procedure: CARDIOVERSION;  Surgeon: [name], MD;  Location: [facility];  Service: Cardiovascular;  Laterality: N/A;   carotid doppler  09/17/2008   rigt and left ICAs 0-49%;mildly  abnormal   CATARACT EXTRACTION Left 2020   Dr. Kathlen Mody   COLONOSCOPY N/A 05/16/2017   Procedure: COLONOSCOPY;  Surgeon: Rogene Houston, MD;  [facility];  Service: Endoscopy;  Laterality: N/A;  [place]  05/25/2009   EF 50-55%,LA mildly dilated, LV function normal   ELECTROPHYSIOLOGIC STUDY N/A 04/05/2015   Procedure: Cardioversion;  Surgeon: Sanda Klein, MD;  Location: [facility];  Service: Cardiovascular;  Laterality: N/A;   ELECTROPHYSIOLOGIC STUDY N/A 09/06/2015   Procedure: Atrial Fibrillation Ablation;  Surgeon: Thompson Grayer, MD;  Location: [facility];  Service: Cardiovascular;  Laterality: N/A;   ELECTROPHYSIOLOGIC STUDY N/A 07/12/2016   redo afib ablation by Dr Rayann Heman   EXCISIONAL HEMORRHOIDECTOMY     "inside and out"   EYE SURGERY Left 2020   Cat Sx - Dr. Kathlen Mody   FINE NEEDLE ASPIRATION Right    knee; "drew ~ 1 quart off"   [place] Right    "neck"   NM MYOCAR PERF WALL MOTION  02/21/2012   EF 61% ,EXERCISE 7 METS. exercise stopped due to wheezing and shortness of breathe   POLYPECTOMY  05/16/2017   Procedure: POLYPECTOMY;  Surgeon: Rogene Houston, MD;  [facility];  Service: Endoscopy;;  colon   PROSTATECTOMY     SHOULDER OPEN ROTATOR CUFF REPAIR Right X 2   TEE WITHOUT CARDIOVERSION N/A 09/05/2015   Procedure: TRANSESOPHAGEAL ECHOCARDIOGRAM (TEE);  Surgeon:  Sueanne Margarita, MD;  Location[facility];  Service: Cardiovascular;  Laterality: N/A;    Current Outpatient Medications  Medication Sig Dispense Refill   amiodarone (PACERONE) 200 MG tablet Take 1 tablet (200 mg total) by mouth daily. 30 tablet 3   amLODipine (NORVASC) 10 MG tablet Take 1 tablet (10 mg total) by mouth at bedtime. 90 tablet 3   apixaban (ELIQUIS) 5 MG TABS tablet Take 1 tablet (5 mg total) by mouth 2 (two) times daily. 180 tablet 1   carvedilol (COREG) 6.25 MG tablet Takes 1 tablet by mouth in the AM and 1 tablet by mouth in the PM     diphenhydramine-acetaminophen (TYLENOL PM) 25-500 MG TABS tablet Take 2 tablets by mouth at bedtime.     Docusate Calcium (STOOL SOFTENER PO) Take  100 mg by mouth every evening.     dorzolamide-timolol (COSOPT) 22.3-6.8 MG/ML ophthalmic solution Place 1 drop into both eyes 2 (two) times daily. 10 mL 6   furosemide (LASIX) 40 MG tablet Take 1 tablet (40 mg total) by mouth 2 (two) times daily. 60 tablet 2   gemfibrozil (LOPID) 600 MG tablet Take 600 mg by mouth at bedtime.      glimepiride (AMARYL) 2 MG tablet Take 2 mg by mouth 2 (two) times daily.      Insulin Glargine (BASAGLAR KWIKPEN) 100 UNIT/ML Inject 35 Units into the skin at bedtime. 40 mL    lisinopril (ZESTRIL) 40 MG tablet Take 40 mg by mouth daily.     Melatonin 10 MG TABS Take 10 mg by mouth at bedtime.     metFORMIN (GLUCOPHAGE-XR) 500 MG 24 hr tablet Take 1,000 mg by mouth in the morning and at bedtime.     mineral oil liquid Take 15 mLs by mouth daily as needed for moderate constipation.     moxifloxacin (VIGAMOX) 0.5 % ophthalmic solution Apply to eye.     Multiple Vitamins-Minerals (MULTIVITAMIN WITH MINERALS) tablet Take 1 tablet by mouth daily. Centrum men 50+     Omega-3 1000 MG CAPS Take 1,000 mg by mouth daily.     potassium chloride SA (KLOR-CON M20) 20 MEQ tablet Take 1 tablet (20 mEq total) by mouth daily. 30 tablet 3   prednisoLONE acetate (PRED FORTE) 1 % ophthalmic  suspension Place 1 drop into the left eye at bedtime.     valACYclovir (VALTREX) 1000 MG tablet Take 1,000 mg by mouth at bedtime.     No current facility-administered medications for this encounter.    Allergies  Allergen Reactions   Tape Rash and Other (See Comments)    Causes skin redness, Use paper tape only.    Social History   Socioeconomic History   Marital status: Married    Spouse name: Not on file   Number of children: Not on file   Years of education: Not on file   Highest education level: Not on file  Occupational History   Occupation: Retired  Tobacco Use   Smoking status: Former    Packs/day: 2.00    Years: 27.00    Pack years: 54.00    Types: Cigarettes    Quit date: 10/31/1983    Years since quitting: 37.9   Smokeless tobacco: Never   Tobacco comments:    Former smoker 09/27/21  Vaping Use   Vaping Use: Never used  Substance and Sexual Activity   Alcohol use: No    Alcohol/week: 0.0 standard drinks    Comment: "used to drink; stopped ~ 2008"   Drug use: No   Sexual activity: Not on file  Other Topics Concern   Not on file  Social History Narrative   Lives in [place], [place] with wife.   Social Determinants of Health   Financial Resource Strain: Not on file  Food Insecurity: Not on file  Transportation Needs: Not on file  Physical Activity: Not on file  Stress: Not on file  Social Connections: Not on file  Intimate Partner Violence: Not on file    Family History  Problem Relation Age of Onset   Heart disease Mother    Lung cancer Mother    Heart attack Mother 56   Stroke Brother    Healthy Daughter    Diabetes Father    Heart disease Father     ROS- All  systems are reviewed and negative except as per the HPI above.  Physical Exam: Vitals:   09/27/21 1345  BP: (!) 150/78  Pulse: 78  Weight: 110.7 kg  Height: 5\' 10"  (1.778 m)    GEN- The patient is well appearing, alert and oriented x 3 today.   Head- normocephalic,  atraumatic Eyes-  Sclera clear, conjunctiva pink Ears- hearing intact Oropharynx- clear Neck- supple  Lungs- Clear to ausculation bilaterally, normal work of breathing Heart- irregular rate and rhythm  GI- soft, NT, ND, + BS Extremities- no clubbing, cyanosis, or  2+ edema  MS- no significant deformity or atrophy Skin- no rash or lesion Psych- euthymic mood, full affect Neuro- strength and sensation are intact  Wt Readings from Last 3 Encounters:  09/27/21 110.7 kg  08/22/21 111.8 kg  08/15/21 110.4 kg    EKG today demonstrates  afib at 78  bpm,  qrs int 112 ms, qtc 478 ms  Epic records are reviewed at length today  Assessment and Plan:  1. Persistent atrial fibrillation  S/p ablation one month ago and SR only lasted a few days He started amiodarone 11/8 to pursue cardioversion and restore SR He initially did not want to f/u with cardioversion but has now consented Risk vrs benefit discussed   Watch fluid status and inform office if he feels he is holding fluid prior to cardioversion  Continue Eliquis 5 mg bid  for CHADS2VASC of 5,states no missed doses for at least 21 days  Cbc/bmet prior to cardioversion   2. HTN Stable   3. Obstructive sleep apnea The patient reports compliance with CPAP   4. Combined systolic /diastolic HF   Normovolemic  Continue with current lasix dose Avoid salt daily weights  F/u here 1-2 weeks after ablation   Roderic Palau, NP 09/27/2021 2:02 PM

## 2021-09-27 NOTE — Patient Instructions (Signed)
Cardioversion scheduled for Tuesday, January 3rd  - Come to afib clinic for labs at [place] at the [facility] and go to admitting at 12PM  - Do not eat or drink anything after midnight the night prior to your procedure.  - Take all your morning medication (except diabetic medications) with a sip of water prior to arrival.  - You will not be able to drive home after your procedure.  - Do NOT miss any doses of your blood thinner - if you should miss a dose please notify our office immediately.  - If you feel as if you go back into normal rhythm prior to scheduled cardioversion, please notify our office immediately. If your procedure is canceled in the cardioversion suite you will be charged a cancellation fee. Patients will be asked to: to mask in public and hand hygiene (no longer quarantine) in the 3 days prior to surgery, to report if any COVID-19-like illness or household contacts to COVID-19 to determine need for testing

## 2021-09-27 NOTE — Addendum Note (Signed)
Encounter addended by: Sherran Needs, NP on: 09/27/2021 2:21 PM  Actions taken: Level of Service modified

## 2021-09-29 ENCOUNTER — Encounter (INDEPENDENT_AMBULATORY_CARE_PROVIDER_SITE_OTHER): Payer: Medicare Other | Admitting: Ophthalmology

## 2021-10-02 NOTE — Progress Notes (Addendum)
[facility] Note  10/04/2021     CHIEF COMPLAINT Patient presents for Retina Follow Up    HISTORY OF PRESENT ILLNESS: Lucas Dudley is a 80 y.o. male who presents to the clinic today for:  HPI     Retina Follow Up   Patient presents with  Diabetic Retinopathy.  In both eyes.  I, the attending physician,  performed the HPI with the patient and updated documentation appropriately.        Comments   Patient here for 5 weeks retina follow up for NPDR OU. Patient states vision pretty good. Had a procedure done to get wrinkles cleared off at a different doctors office.. Were sand blasted. No eye pain.       Last edited by Bernarda Caffey, MD on 10/04/2021  4:14 PM.    Pt had a "procedure" with Dr. Edilia Bo last Monday  Referring physician: [name], MD [place],  [place] [id number]  HISTORICAL INFORMATION:   Selected notes from the MEDICAL RECORD NUMBER Referred by Dr. Kathlen Mody for DEE   CURRENT MEDICATIONS: Current Outpatient Medications (Ophthalmic Drugs)  Medication Sig   dorzolamide-timolol (COSOPT) 22.3-6.8 MG/ML ophthalmic solution Place 1 drop into both eyes 2 (two) times daily.   moxifloxacin (VIGAMOX) 0.5 % ophthalmic solution Apply to eye.   prednisoLONE acetate (PRED FORTE) 1 % ophthalmic suspension Place 1 drop into the left eye at bedtime.   No current facility-administered medications for this visit. (Ophthalmic Drugs)   Current Outpatient Medications (Other)  Medication Sig   amiodarone (PACERONE) 200 MG tablet Take 1 tablet (200 mg total) by mouth daily.   amLODipine (NORVASC) 10 MG tablet Take 1 tablet (10 mg total) by mouth at bedtime.   apixaban (ELIQUIS) 5 MG TABS tablet Take 1 tablet (5 mg total) by mouth 2 (two) times daily.   carvedilol (COREG) 6.25 MG tablet Takes 1 tablet by mouth in the AM and 1 tablet by mouth in the PM   diphenhydramine-acetaminophen (TYLENOL PM) 25-500 MG TABS tablet Take 2  tablets by mouth at bedtime.   Docusate Calcium (STOOL SOFTENER PO) Take 100 mg by mouth every evening.   furosemide (LASIX) 40 MG tablet Take 1 tablet (40 mg total) by mouth 2 (two) times daily.   gemfibrozil (LOPID) 600 MG tablet Take 600 mg by mouth at bedtime.    glimepiride (AMARYL) 2 MG tablet Take 2 mg by mouth 2 (two) times daily.    Insulin Glargine (BASAGLAR KWIKPEN) 100 UNIT/ML Inject 35 Units into the skin at bedtime.   lisinopril (ZESTRIL) 40 MG tablet Take 40 mg by mouth daily.   Melatonin 10 MG TABS Take 10 mg by mouth at bedtime.   metFORMIN (GLUCOPHAGE-XR) 500 MG 24 hr tablet Take 1,000 mg by mouth in the morning and at bedtime.   mineral oil liquid Take 15 mLs by mouth daily as needed for moderate constipation.   Multiple Vitamins-Minerals (MULTIVITAMIN WITH MINERALS) tablet Take 1 tablet by mouth daily. Centrum men 50+   Omega-3 1000 MG CAPS Take 1,000 mg by mouth daily.   potassium chloride SA (KLOR-CON M20) 20 MEQ tablet Take 1 tablet (20 mEq total) by mouth daily.   valACYclovir (VALTREX) 1000 MG tablet Take 1,000 mg by mouth at bedtime.   No current facility-administered medications for this visit. (Other)   REVIEW OF SYSTEMS: ROS   Positive for: Gastrointestinal, Endocrine, Cardiovascular, Eyes, Respiratory Negative for: Constitutional, Neurological, Skin, Genitourinary, Musculoskeletal, HENT,  Psychiatric, Allergic/Imm, Heme/Lymph Last edited by Theodore Demark, COA on 10/04/2021  2:23 PM.       ALLERGIES Allergies  Allergen Reactions   Tape Rash and Other (See Comments)    Causes skin redness, Use paper tape only.   PAST MEDICAL HISTORY Past Medical History:  Diagnosis Date   Arthritis    Atrial fibrillation (HCC)    CAD (coronary artery disease)    a. Cath 03/17/15 showing 100% ostial D1, 50% prox LAD to mid LAD, 40% RPDA stenosis. Med rx. // Myoview 01/2020: EF 31 diffuse perfusion defect without reversibility (suspect artifact); reviewed with Dr.  Aggie Cosier study felt to be low risk   Cataract    Mixed form OD   Chronic diastolic CHF (congestive heart failure) (HCC)    Diabetic retinopathy ([place])    NPDR OU   Essential hypertension    Glaucoma    POAG OU   History of gout    Hyperlipidemia    Hypertensive retinopathy    OU   Kidney stones    Melanoma of neck (HCC)    NICM (nonischemic cardiomyopathy) ([place])    OSA on CPAP 2012   Prostate cancer ([place])    Type II diabetes mellitus ([place])    Past Surgical History:  Procedure Laterality Date   ABDOMINAL HERNIA REPAIR     w/mesh   ATRIAL FIBRILLATION ABLATION N/A 06/06/2021   Procedure: ATRIAL FIBRILLATION ABLATION;  Surgeon: Thompson Grayer, MD;  Location: [facility];  Service: Cardiovascular;  Laterality: N/A;   CARDIAC CATHETERIZATION N/A 03/17/2015   Procedure: Left Heart Cath and Coronary Angiography;  Surgeon: Lorretta Harp, MD;  Location: [facility];  Service: Cardiovascular;  Laterality: N/A;   CARDIOVERSION N/A 08/09/2021   Procedure: CARDIOVERSION;  Surgeon: [name], MD;  Location: [facility];  Service: Cardiovascular;  Laterality: N/A;   carotid doppler  09/17/2008   rigt and left ICAs 0-49%;mildly  abnormal   CATARACT EXTRACTION Left 2020   Dr. Kathlen Mody   COLONOSCOPY N/A 05/16/2017   Procedure: COLONOSCOPY;  Surgeon: Rogene Houston, MD;  [facility];  Service: Endoscopy;  Laterality: N/A;  [place]  05/25/2009   EF 50-55%,LA mildly dilated, LV function normal   ELECTROPHYSIOLOGIC STUDY N/A 04/05/2015   Procedure: Cardioversion;  Surgeon: Sanda Klein, MD;  Location: [facility];  Service: Cardiovascular;  Laterality: N/A;   ELECTROPHYSIOLOGIC STUDY N/A 09/06/2015   Procedure: Atrial Fibrillation Ablation;  Surgeon: Thompson Grayer, MD;  Location: [facility];  Service: Cardiovascular;  Laterality: N/A;   ELECTROPHYSIOLOGIC STUDY N/A 07/12/2016   [facility] afib ablation by Dr Rayann Heman   EXCISIONAL  HEMORRHOIDECTOMY     "inside and out"   EYE SURGERY Left 2020   Cat Sx - Dr. Kathlen Mody   FINE NEEDLE ASPIRATION Right    knee; "drew ~ 1 quart off"   [place] Right    "neck"   NM MYOCAR PERF WALL MOTION  02/21/2012   EF 61% ,EXERCISE 7 METS. exercise stopped due to wheezing and shortness of breathe   POLYPECTOMY  05/16/2017   Procedure: POLYPECTOMY;  Surgeon: Rogene Houston, MD;  [facility];  Service: Endoscopy;;  colon   PROSTATECTOMY     SHOULDER OPEN ROTATOR CUFF REPAIR Right X 2   TEE WITHOUT CARDIOVERSION N/A 09/05/2015   Procedure: TRANSESOPHAGEAL ECHOCARDIOGRAM (TEE);  Surgeon: Tressia Miners  Remonia Richter, MD;  [facility];  Service: Cardiovascular;  Laterality: N/A;   FAMILY HISTORY Family History  Problem Relation Age of Onset   Heart disease Mother    Lung cancer Mother    Heart attack Mother 18   Stroke Brother    Healthy Daughter    Diabetes Father    Heart disease Father    SOCIAL HISTORY Social History   Tobacco Use   Smoking status: Former    Packs/day: 2.00    Years: 27.00    Pack years: 54.00    Types: Cigarettes    Quit date: 10/31/1983    Years since quitting: 37.9   Smokeless tobacco: Never   Tobacco comments:    Former smoker 09/27/21  Vaping Use   Vaping Use: Never used  Substance Use Topics   Alcohol use: No    Alcohol/week: 0.0 standard drinks    Comment: "used to drink; stopped ~ 2008"   Drug use: No       OPHTHALMIC EXAM: Base Eye Exam     Visual Acuity (Snellen - Linear)       Right Left   Dist cc 20/30 -2 20/70 -2   Dist ph cc NI NI    Correction: Glasses         Tonometry (Tonopen, 2:20 PM)       Right Left   Pressure 21 20  Used drops today.        Pupils       Dark Light Shape React APD   Right 2 1 Round Minimal None   Left 2 1 Round Minimal None         Visual Fields (Counting fingers)       Left Right    Full Full          Extraocular Movement       Right Left    Full, Ortho Full, Ortho         Neuro/Psych     Oriented x3: Yes   Mood/Affect: Normal         Dilation     Both eyes: 1.0% Mydriacyl, 2.5% Phenylephrine @ 2:20 PM           Slit Lamp and Fundus Exam     Slit Lamp Exam       Right Left   Lids/Lashes Dermato, mild MGD Dermato, mild MGD   Conjunctiva/Sclera Temporal pinguecula Temporal pinguecula   Cornea EBMD, mild haze, 1+ PEE, mild Debris in tear film trace haze, 1+ PEE, well healed temporal cataract wounds, arcus   Anterior Chamber Deep and clear; narrow temporal angle Deep and clear   Iris Round and dilated, mild anterior bowing, No NVI Round and moderately dilated to 5.72mm   Lens PCIOL in good position PCIOL, 1+ PCO - non central   Anterior Vitreous Synerisis Synerisis         Fundus Exam       Right Left   Disc Mild pallor, sharp, +cupping, thin inferior rim Mild pallor, sharp rim, +cupping, +PPA   C/D Ratio 0.7 0.6   Macula Flat, blunted foveal reflex, trace central cystic changes, RPE mottling and clumping, no heme Flat, blunted foveal reflex, clusters of fine MA nasal and superior to fovea--improved, +cystic changes -- slightly improved, focal exudates superior to fovea - improving   Vessels attenuated, Tortuous Attenuated, mild av crossing changes, mild tortuousity   Periphery Attached, rare MA Attached. No heme.  Refraction     Wearing Rx       Sphere Cylinder Axis Add   Right -0.25 +2.00 158 +2.50   Left -1.00 +1.25 008 +2.50           IMAGING AND PROCEDURES  Imaging and Procedures for 10/04/2021  OCT, Retina - OU - Both Eyes       Right Eye Quality was good. Central Foveal Thickness: 307. Progression has been stable. Findings include abnormal foveal contour, no SRF, vitreomacular adhesion , intraretinal fluid (Persistent cystic changes superior to fovea).   Left Eye Quality was good. Central Foveal Thickness: 320. Progression  has improved. Findings include abnormal foveal contour, intraretinal fluid, intraretinal hyper-reflective material (Mild interval improvement in IRF/IRHM; partial PVD).   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 CME - cystoid macular edema. PED - pigment epithelial detachment. IRF - intraretinal fluid. SRF - subretinal fluid. EZ - ellipsoid zone. ERM - epiretinal membrane. ORA - outer retinal atrophy. ORT - outer retinal tubulation. SRHM - subretinal hyper-reflective material. IRHM - intraretinal hyper-reflective material     Intravitreal Injection, Pharmacologic Agent - OS - Left Eye       Time Out 10/04/2021. 2:45 PM. Confirmed correct patient, procedure, site, and patient consented.   Anesthesia Topical anesthesia was used. Anesthetic medications included Lidocaine 2%, Proparacaine 0.5%.   Procedure Preparation included 5% betadine to ocular surface, eyelid speculum. A (32g) needle was used.   Injection: 2 mg aflibercept 2 MG/0.05ML   Route: Intravitreal, Site: Left Eye   NDC: [id number], Lot: [id number], Expiration date: 08/15/2022, Waste: 0.05 mL   Post-op Post injection exam found visual acuity of at least counting fingers. The patient tolerated the procedure well. There were no complications. The patient received written and verbal post procedure care education. Post injection medications were not given.            ASSESSMENT/PLAN:   ICD-10-CM   1. 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, associated with type 2 diabetes mellitus (HCC)  B09.6283 OCT, Retina - OU - Both Eyes    Intravitreal Injection, Pharmacologic Agent - OS - Left Eye    aflibercept (EYLEA) SOLN 2 mg    2. Essential  hypertension  I10     3. Hypertensive retinopathy of both eyes  H35.033     4. Pseudophakia of both eyes  Z96.1     5. 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 History of herpes zoster of eye  Z86.19     7. Primary open angle glaucoma of both eyes, unspecified glaucoma stage  H40.1130       1. 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 Single focal MA superior to fovea.                                  OS: Perifoveal Mas w/late leakage - OCT shows diabetic macular edema OU (OS>OD) - s/p IVA OS # 1(07.26.21), #2 (08.24.21), #3 (09.21.21), #4 (10.22.21) -- IVA resistance - s/p IVE OS #1 (11.22.21), #2 (12.21.21), #3 (01.20.22), #4 (02.18.22), #5 (03.18.22), #6 (04.15.22), #7 (05.10.22) -- sample, #8 (06.08.22), #  9 (07.12.22), #10 (08.12.22), #11 (09.16.22), #12 (10.21.22), #13 (11.18.22) - BCVA OS improved to 20/70 from 20/80  - OCT shows OD: Persistent cystic changes superior to fovea; OS: Persistent IRF/IRHM - slightly improved; partial PVD - recommend IVE OS #14 today, 12.21.22 - pt wishes to proceed with injection - RBA of procedure discussed, questions answered - IVA informed consent obtained and signed, 07.26.21 (OS) - IVE informed consent obtained and signed, 11.22.21 (OS) - ssee procedure note - Good Days approved Eylea until 10/14/2021. - [employer] approved Eylea until 09/04/2021 - f/u in 4 wks -- DFE/OCT, possible injection  2,3. Hypertensive retinopathy OU - discussed importance of tight BP control - monitor   4. Pseudophakia OU  - s/p CE/IOL OS (2020, Dr. Kathlen Mody)             - s/p CE/IOL OD (2022, Dr. Kathlen Mody)  - IOLs in good position, doing well  - monitor   5. 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 Susa Simmonds in August 2022  - s/p SuperK OS on September 25, 2021  6. History of herpes zoster/iritis OS  - on valtrex 1g daily---maintenance   7. POAG OU  - was under the expert management of Dr.  Kathlen Mody  - IOP 21,20 today  - currently on cosopt BID OU  Ophthalmic Meds Ordered this visit:  Meds ordered this encounter  Medications   aflibercept (EYLEA) SOLN 2 mg     Return in about 4 weeks (around 11/01/2021) for f/u NPDR OU, DFE, OCT.  There are no Patient Instructions on file for this visit.  This document serves as a record of services personally performed by Gardiner Sleeper, MD, PhD. It was created on their behalf by Orvan Falconer, an ophthalmic technician. The creation of this record is the provider's dictation and/or activities during the visit.    Electronically signed by: Orvan Falconer, OA, 10/04/21  4:18 PM  This document serves as a record of services personally performed by Gardiner Sleeper, MD, PhD. It was created on their behalf by San Jetty. Owens Shark, OA an ophthalmic technician. The creation of this record is the provider's dictation and/or activities during the visit.    Electronically signed by: San Jetty. Owens Shark, OA 12. 4:18 PM  Gardiner Sleeper, M.D., Ph.D. Diseases & Surgery of the Retina and Vitreous [facility]  I have reviewed the above documentation for accuracy and completeness, and I agree with the above. Gardiner Sleeper, M.D., Ph.D. 10/04/21 4:18 PM   Abbreviations: M myopia (nearsighted); A astigmatism; H hyperopia (farsighted); P presbyopia; Mrx spectacle prescription;  CTL contact lenses; OD right eye; OS left eye; OU both eyes  XT exotropia; ET esotropia; PEK punctate epithelial keratitis; PEE punctate epithelial erosions; DES dry eye syndrome; MGD meibomian gland dysfunction; ATs artificial tears; PFAT's preservative free artificial tears; [place] nuclear sclerotic cataract; PSC posterior subcapsular cataract; ERM epi-retinal membrane; PVD posterior vitreous detachment; RD retinal detachment; DM diabetes mellitus; DR diabetic retinopathy; NPDR non-proliferative diabetic retinopathy; PDR proliferative diabetic retinopathy; CSME  clinically significant macular edema; DME diabetic macular edema; dbh dot blot hemorrhages; CWS cotton wool spot; POAG primary open angle glaucoma; C/D cup-to-disc ratio; HVF humphrey visual field; GVF goldmann visual field; OCT optical coherence tomography; IOP intraocular pressure; BRVO Branch retinal vein occlusion; CRVO central retinal vein occlusion; CRAO central retinal artery occlusion; BRAO branch retinal artery occlusion; RT retinal tear; SB scleral buckle; PPV pars plana vitrectomy; VH Vitreous hemorrhage; PRP  panretinal laser photocoagulation; IVK intravitreal kenalog; VMT vitreomacular traction; MH Macular hole;  NVD neovascularization of the disc; NVE neovascularization elsewhere; AREDS age related eye disease study; ARMD age related macular degeneration; POAG primary open angle glaucoma; EBMD epithelial/anterior basement membrane dystrophy; ACIOL anterior chamber intraocular lens; IOL intraocular lens; PCIOL posterior chamber intraocular lens; Phaco/IOL phacoemulsification with intraocular lens placement; [place] photorefractive keratectomy; LASIK laser assisted in situ keratomileusis; HTN hypertension; DM diabetes mellitus; COPD chronic obstructive pulmonary disease

## 2021-10-03 ENCOUNTER — Encounter (HOSPITAL_COMMUNITY): Payer: Self-pay | Admitting: Cardiovascular Disease

## 2021-10-04 ENCOUNTER — Other Ambulatory Visit: Payer: Self-pay

## 2021-10-04 ENCOUNTER — Ambulatory Visit (INDEPENDENT_AMBULATORY_CARE_PROVIDER_SITE_OTHER): Payer: Medicare Other | Admitting: Ophthalmology

## 2021-10-04 ENCOUNTER — Encounter (INDEPENDENT_AMBULATORY_CARE_PROVIDER_SITE_OTHER): Payer: Self-pay | Admitting: Ophthalmology

## 2021-10-04 DIAGNOSIS — Z8619 Personal history of other infectious and parasitic diseases: Secondary | ICD-10-CM

## 2021-10-04 DIAGNOSIS — Z961 Presence of intraocular lens: Secondary | ICD-10-CM | POA: Diagnosis not present

## 2021-10-04 DIAGNOSIS — I10 Essential (primary) hypertension: Secondary | ICD-10-CM

## 2021-10-04 DIAGNOSIS — H35033 Hypertensive retinopathy, bilateral: Secondary | ICD-10-CM | POA: Diagnosis not present

## 2021-10-04 DIAGNOSIS — E113213 Type 2 diabetes mellitus with mild nonproliferative diabetic retinopathy with macular edema, bilateral: Secondary | ICD-10-CM

## 2021-10-04 DIAGNOSIS — H401130 Primary open-angle glaucoma, bilateral, stage unspecified: Secondary | ICD-10-CM

## 2021-10-04 DIAGNOSIS — H18523 Epithelial (juvenile) corneal dystrophy, bilateral: Secondary | ICD-10-CM

## 2021-10-04 MED ORDER — AFLIBERCEPT 2MG/0.05ML IZ SOLN FOR KALEIDOSCOPE
2.0000 mg | INTRAVITREAL | Status: AC | PRN
Start: 2021-10-04 — End: 2021-10-04
  Administered 2021-10-04: 16:00:00 2 mg via INTRAVITREAL

## 2021-10-11 ENCOUNTER — Other Ambulatory Visit (HOSPITAL_COMMUNITY): Payer: Self-pay | Admitting: Nurse Practitioner

## 2021-10-17 ENCOUNTER — Other Ambulatory Visit (HOSPITAL_COMMUNITY): Payer: Medicare Other | Admitting: Physician Assistant

## 2021-10-17 ENCOUNTER — Ambulatory Visit (HOSPITAL_COMMUNITY): Admission: RE | Admit: 2021-10-17 | Payer: Medicare Other | Source: Ambulatory Visit | Admitting: Cardiovascular Disease

## 2021-10-17 SURGERY — CARDIOVERSION
Anesthesia: Monitor Anesthesia Care

## 2021-10-18 ENCOUNTER — Other Ambulatory Visit (HOSPITAL_COMMUNITY): Payer: Self-pay | Admitting: *Deleted

## 2021-10-18 ENCOUNTER — Encounter (HOSPITAL_COMMUNITY): Payer: Self-pay | Admitting: *Deleted

## 2021-10-22 ENCOUNTER — Other Ambulatory Visit: Payer: Self-pay | Admitting: Internal Medicine

## 2021-10-22 DIAGNOSIS — I480 Paroxysmal atrial fibrillation: Secondary | ICD-10-CM

## 2021-10-23 NOTE — Telephone Encounter (Signed)
Eliquis 5mg  refill request received. Patient is 81 years old, weight-110.7kg, Crea-1.19 on 08/22/2021, Diagnosis-Afib, and last seen by Roderic Palau on 09/27/2021. Dose is appropriate based on dosing criteria. Will send in refill to requested pharmacy.

## 2021-11-01 ENCOUNTER — Ambulatory Visit (HOSPITAL_COMMUNITY): Payer: Medicare Other | Admitting: Nurse Practitioner

## 2021-11-01 ENCOUNTER — Encounter (HOSPITAL_COMMUNITY): Payer: Self-pay | Admitting: Cardiology

## 2021-11-03 ENCOUNTER — Other Ambulatory Visit: Payer: Self-pay

## 2021-11-03 ENCOUNTER — Ambulatory Visit (INDEPENDENT_AMBULATORY_CARE_PROVIDER_SITE_OTHER): Payer: Medicare Other | Admitting: Ophthalmology

## 2021-11-03 ENCOUNTER — Encounter (INDEPENDENT_AMBULATORY_CARE_PROVIDER_SITE_OTHER): Payer: Self-pay | Admitting: Ophthalmology

## 2021-11-03 DIAGNOSIS — H35033 Hypertensive retinopathy, bilateral: Secondary | ICD-10-CM | POA: Diagnosis not present

## 2021-11-03 DIAGNOSIS — Z961 Presence of intraocular lens: Secondary | ICD-10-CM

## 2021-11-03 DIAGNOSIS — E113213 Type 2 diabetes mellitus with mild nonproliferative diabetic retinopathy with macular edema, bilateral: Secondary | ICD-10-CM | POA: Diagnosis not present

## 2021-11-03 DIAGNOSIS — H401130 Primary open-angle glaucoma, bilateral, stage unspecified: Secondary | ICD-10-CM

## 2021-11-03 DIAGNOSIS — I10 Essential (primary) hypertension: Secondary | ICD-10-CM | POA: Diagnosis not present

## 2021-11-03 DIAGNOSIS — Z8619 Personal history of other infectious and parasitic diseases: Secondary | ICD-10-CM

## 2021-11-03 DIAGNOSIS — H18523 Epithelial (juvenile) corneal dystrophy, bilateral: Secondary | ICD-10-CM

## 2021-11-03 MED ORDER — AFLIBERCEPT 2MG/0.05ML IZ SOLN FOR KALEIDOSCOPE
2.0000 mg | INTRAVITREAL | Status: AC | PRN
  Administered 2021-11-03: 2 mg via INTRAVITREAL

## 2021-11-03 NOTE — Progress Notes (Signed)
[facility] Note  11/03/2021     CHIEF COMPLAINT Patient presents for Retina Follow Up   HISTORY OF PRESENT ILLNESS: Lucas Dudley is a 81 y.o. male who presents to the clinic today for:  HPI     Retina Follow Up   Patient presents with  Diabetic Retinopathy.  In both eyes.  Severity is moderate.  Duration of 4 weeks.  Since onset it is stable.  I, the attending physician,  performed the HPI with the patient and updated documentation appropriately.        Comments   Patient states vision improved some. Using combigan bid OU. BS was 135 this am.       Last edited by Bernarda Caffey, MD on 11/03/2021  3:08 PM.      Referring physician: [name], MD [place],  [place] [id number]  HISTORICAL INFORMATION:   Selected notes from the MEDICAL RECORD NUMBER Referred by Dr. Kathlen Mody for DEE   CURRENT MEDICATIONS: Current Outpatient Medications (Ophthalmic Drugs)  Medication Sig   COMBIGAN 0.2-0.5 % ophthalmic solution Apply 1 drop to eye 2 (two) times daily.   No current facility-administered medications for this visit. (Ophthalmic Drugs)   Current Outpatient Medications (Other)  Medication Sig   albuterol (VENTOLIN HFA) 108 (90 Base) MCG/ACT inhaler Inhale 2 puffs into the lungs every 4 (four) hours as needed for wheezing or shortness of breath.   amiodarone (PACERONE) 200 MG tablet Take 1 tablet (200 mg total) by mouth daily.   amLODipine (NORVASC) 10 MG tablet Take 1 tablet (10 mg total) by mouth at bedtime.   carvedilol (COREG) 6.25 MG tablet Takes 1 tablet by mouth in the AM and 1 tablet by mouth in the PM   Cholecalciferol (VITAMIN D-3 PO) Take 1,000 mg by mouth daily.   diphenhydramine-acetaminophen (TYLENOL PM) 25-500 MG TABS tablet Take 2 tablets by mouth at bedtime.   Docusate Calcium (STOOL SOFTENER PO) Take 100 mg by mouth every evening.   ELIQUIS 5 MG TABS tablet TAKE 1 TABLET BY MOUTH TWICE A DAY   furosemide (LASIX)  40 MG tablet TAKE 1 TABLET BY MOUTH TWICE A DAY   gemfibrozil (LOPID) 600 MG tablet Take 600 mg by mouth at bedtime.    glimepiride (AMARYL) 2 MG tablet Take 2 mg by mouth 2 (two) times daily.    Insulin Glargine (BASAGLAR KWIKPEN) 100 UNIT/ML Inject 35 Units into the skin at bedtime. (Patient taking differently: Inject 40 Units into the skin at bedtime.)   lisinopril (ZESTRIL) 40 MG tablet Take 40 mg by mouth daily.   Melatonin 10 MG TABS Take 10 mg by mouth at bedtime.   metFORMIN (GLUCOPHAGE-XR) 500 MG 24 hr tablet Take 1,000 mg by mouth in the morning and at bedtime.   mineral oil liquid Take 15 mLs by mouth daily as needed for moderate constipation.   Multiple Vitamins-Minerals (MULTIVITAMIN WITH MINERALS) tablet Take 1 tablet by mouth daily. Centrum men 50+   Omega-3 1000 MG CAPS Take 1,000 mg by mouth daily.   potassium chloride SA (KLOR-CON M20) 20 MEQ tablet Take 1 tablet (20 mEq total) by mouth daily.   valACYclovir (VALTREX) 1000 MG tablet Take 1,000 mg by mouth at bedtime.   No current facility-administered medications for this visit. (Other)   REVIEW OF SYSTEMS: ROS   Positive for: Gastrointestinal, Endocrine, Cardiovascular, Eyes, Respiratory Negative for: Constitutional, Neurological, Skin, Genitourinary, Musculoskeletal, HENT, Psychiatric, Allergic/Imm, Heme/Lymph Last edited by  Roselee Nova D, COT on 11/03/2021  1:50 PM.     ALLERGIES Allergies  Allergen Reactions   Tape Rash and Other (See Comments)    Causes skin redness, Use paper tape only.   PAST MEDICAL HISTORY Past Medical History:  Diagnosis Date   Arthritis    Atrial fibrillation (HCC)    CAD (coronary artery disease)    a. Cath 03/17/15 showing 100% ostial D1, 50% prox LAD to mid LAD, 40% RPDA stenosis. Med rx. // Myoview 01/2020: EF 31 diffuse perfusion defect without reversibility (suspect artifact); reviewed with Dr. Aggie Cosier study felt to be low risk   Cataract    Mixed form OD   Chronic  diastolic CHF (congestive heart failure) (HCC)    Diabetic retinopathy ([place])    NPDR OU   Essential hypertension    Glaucoma    POAG OU   History of gout    Hyperlipidemia    Hypertensive retinopathy    OU   Kidney stones    Melanoma of neck (HCC)    NICM (nonischemic cardiomyopathy) ([place])    OSA on CPAP 2012   Prostate cancer ([place])    Type II diabetes mellitus ([place])    Past Surgical History:  Procedure Laterality Date   ABDOMINAL HERNIA REPAIR     w/mesh   ATRIAL FIBRILLATION ABLATION N/A 06/06/2021   Procedure: ATRIAL FIBRILLATION ABLATION;  Surgeon: Thompson Grayer, MD;  Location: [facility];  Service: Cardiovascular;  Laterality: N/A;   CARDIAC CATHETERIZATION N/A 03/17/2015   Procedure: Left Heart Cath and Coronary Angiography;  Surgeon: Lorretta Harp, MD;  Location: [facility];  Service: Cardiovascular;  Laterality: N/A;   CARDIOVERSION N/A 08/09/2021   Procedure: CARDIOVERSION;  Surgeon: [name], MD;  Location: [facility];  Service: Cardiovascular;  Laterality: N/A;   carotid doppler  09/17/2008   rigt and left ICAs 0-49%;mildly  abnormal   CATARACT EXTRACTION Left 2020   Dr. Kathlen Mody   COLONOSCOPY N/A 05/16/2017   Procedure: COLONOSCOPY;  Surgeon: Rogene Houston, MD;  [facility];  Service: Endoscopy;  Laterality: N/A;  Otho  05/25/2009   EF 50-55%,LA mildly dilated, LV function normal   ELECTROPHYSIOLOGIC STUDY N/A 04/05/2015   Procedure: Cardioversion;  Surgeon: Sanda Klein, MD;  Location: [facility];  Service: Cardiovascular;  Laterality: N/A;   ELECTROPHYSIOLOGIC STUDY N/A 09/06/2015   Procedure: Atrial Fibrillation Ablation;  Surgeon: Thompson Grayer, MD;  Location: [facility];  Service: Cardiovascular;  Laterality: N/A;   ELECTROPHYSIOLOGIC STUDY N/A 07/12/2016   redo afib ablation by Dr Rayann Heman   EXCISIONAL HEMORRHOIDECTOMY     "inside and out"   EYE SURGERY Left 2020   Cat Sx - Dr.  Kathlen Mody   FINE NEEDLE ASPIRATION Right    knee; "drew ~ 1 quart off"   Garner Right    "neck"   NM MYOCAR PERF WALL MOTION  02/21/2012   EF 61% ,EXERCISE 7 METS. exercise stopped due to wheezing and shortness of breathe   POLYPECTOMY  05/16/2017   Procedure: POLYPECTOMY;  Surgeon: Rogene Houston, MD;  [facility];  Service: Endoscopy;;  colon   PROSTATECTOMY     SHOULDER OPEN ROTATOR CUFF REPAIR Right X 2   TEE WITHOUT CARDIOVERSION N/A 09/05/2015   Procedure: TRANSESOPHAGEAL ECHOCARDIOGRAM (TEE);  Surgeon: Sueanne Margarita, MD;  Location: [facility];  Service: Cardiovascular;  Laterality: N/A;   FAMILY HISTORY Family History  Problem Relation Age of Onset   Heart disease Mother    Lung cancer Mother    Heart attack Mother 81   Stroke Brother    Healthy Daughter    Diabetes Father    Heart disease Father    SOCIAL HISTORY Social History   Tobacco Use   Smoking status: Former    Packs/day: 2.00    Years: 27.00    Pack years: 54.00    Types: Cigarettes    Quit date: 10/31/1983    Years since quitting: 38.0   Smokeless tobacco: Never   Tobacco comments:    Former smoker 09/27/21  Vaping Use   Vaping Use: Never used  Substance Use Topics   Alcohol use: No    Alcohol/week: 0.0 standard drinks    Comment: "used to drink; stopped ~ 2008"   Drug use: No       OPHTHALMIC EXAM: Base Eye Exam     Visual Acuity (Snellen - Linear)       Right Left   Dist cc 20/40 20/70   Dist ph cc 20/40 +1 NI    Correction: Glasses         Tonometry (Tonopen, 2:00 PM)       Right Left   Pressure 19 18         Pupils       Dark Light Shape React APD   Right 2 1 Round Minimal None   Left 2 1 Round Minimal None         Visual Fields (Counting fingers)       Left Right    Full Full         Extraocular Movement       Right Left    Full, Ortho Full, Ortho         Neuro/Psych      Oriented x3: Yes   Mood/Affect: Normal         Dilation     Both eyes: 1.0% Mydriacyl, 2.5% Phenylephrine @ 2:00 PM           Slit Lamp and Fundus Exam     Slit Lamp Exam       Right Left   Lids/Lashes Dermato, mild MGD Dermato, mild MGD   Conjunctiva/Sclera Temporal pinguecula Temporal pinguecula   Cornea EBMD, mild haze, trace PEE, mild Debris in tear film, well haled cataract wound trace haze, trace PEE, well healed temporal cataract wounds, arcus   Anterior Chamber Deep and clear; narrow temporal angle Deep and clear   Iris Round and dilated, mild anterior bowing, No NVI Round and moderately dilated to 5.26mm   Lens PCIOL in good position, trace PCO PCIOL, 2+ PCO   Anterior Vitreous Synerisis Synerisis         Fundus Exam       Right Left   Disc Mild pallor, sharp, +cupping, thin inferior rim Mild pallor, sharp rim, +cupping, +PPA   C/D Ratio 0.7 0.6   Macula Flat, blunted foveal reflex, trace central cystic changes, RPE mottling and clumping, no heme Flat, blunted foveal reflex, clusters of fine MA nasal and superior to fovea--improved, +cystic changes -- slightly improved, focal exudates superior to fovea - improving   Vessels attenuated, Tortuous Attenuated, mild av crossing changes, mild tortuousity   Periphery Attached, rare MA Attached. No heme.           Refraction  Wearing Rx       Sphere Cylinder Axis Add   Right -0.25 +2.00 158 +2.50   Left -1.00 +1.25 008 +2.50           IMAGING AND PROCEDURES  Imaging and Procedures for 11/03/2021  OCT, Retina - OU - Both Eyes       Right Eye Quality was good. Central Foveal Thickness: 306. Progression has improved. Findings include abnormal foveal contour, no SRF, vitreomacular adhesion , intraretinal fluid (Persistent cystic changes superior to fovea--slightly improved).   Left Eye Quality was good. Central Foveal Thickness: 327. Progression has improved. Findings include abnormal foveal contour,  intraretinal fluid, intraretinal hyper-reflective material (Mild interval improvement in IRF/IRHM; partial PVD).   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 CME - cystoid macular edema. PED - pigment epithelial detachment. IRF - intraretinal fluid. SRF - subretinal fluid. EZ - ellipsoid zone. ERM - epiretinal membrane. ORA - outer retinal atrophy. ORT - outer retinal tubulation. SRHM - subretinal hyper-reflective material. IRHM - intraretinal hyper-reflective material     Intravitreal Injection, Pharmacologic Agent - OS - Left Eye       Time Out 11/03/2021. 2:26 PM. Confirmed correct patient, procedure, site, and patient consented.   Anesthesia Topical anesthesia was used. Anesthetic medications included Lidocaine 2%, Proparacaine 0.5%.   Procedure Preparation included 5% betadine to ocular surface, eyelid speculum. A (32g) needle was used.   Injection: 2 mg aflibercept 2 MG/0.05ML   Route: Intravitreal, Site: Left Eye   NDC: A3590391, Lot: [id number], Expiration date: 09/13/2022, Waste: 0.05 mL   Post-op Post injection exam found visual acuity of at least counting fingers. The patient tolerated the procedure well. There were no complications. The patient received written and verbal post procedure care education. Post injection medications were not given.            ASSESSMENT/PLAN:   ICD-10-CM   1. 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, associated with type 2 diabetes mellitus (HCC)  Y85.0277 OCT, Retina - OU - Both Eyes    Intravitreal Injection, Pharmacologic Agent - OS - Left Eye    aflibercept (EYLEA) SOLN 2 mg    2. Essential hypertension  I10     3. Hypertensive  retinopathy of both eyes  H35.033     4. Pseudophakia of both eyes  Z96.1     5. 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 History of herpes zoster of eye  Z86.19     7. Primary open angle glaucoma of both eyes, unspecified glaucoma stage  H40.1130      1. 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 Single focal MA superior to fovea.                                  OS: Perifoveal Mas w/late leakage - OCT shows diabetic macular edema OU (OS>OD) - s/p IVA OS # 1(07.26.21), #2 (08.24.21), #3 (09.21.21), #4 (10.22.21) -- IVA resistance - s/p IVE OS #1 (11.22.21), #2 (12.21.21), #3 (01.20.22), #4 (02.18.22), #5 (03.18.22), #6 (04.15.22), #7 (05.10.22) -- sample, #8 (06.08.22), #9 (07.12.22), #10 (08.12.22), #11 (  09.16.22), #12 (10.21.22), #13 (11.18.22), #14 (12.21.22) - BCVA OS stable at 20/70  - OCT shows OD: Persistent cystic changes superior to fovea--slightly improved; OS: Mild interval improvement in IRF/IRHM; partial PVD - recommend IVE OS #15 today, 01.20.23 - pt wishes to proceed with injection - RBA of procedure discussed, questions answered - IVA informed consent obtained and signed, 07.26.21 (OS) - IVE informed consent obtained and signed, 11.22.21 (OS) - ssee procedure note - [employer] Days approved Eylea until 10/14/2021. - [employer] approved Eylea until 09/04/2021 - f/u in 4 wks -- DFE/OCT, possible injection  2,3. Hypertensive retinopathy OU - discussed importance of tight BP control - monitor   4. Pseudophakia OU  - s/p CE/IOL OS (2020, Dr. Kathlen Mody)             - s/p CE/IOL OD (2022, Dr. Kathlen Mody)  - IOLs in good position, doing well  - monitor   5. 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 Susa Simmonds in August 2022  - s/p SuperK OS on September 25, 2021  6. History of herpes zoster/iritis OS  - on valtrex 1g daily---maintenance    7. POAG OU  - was under the expert management of Dr. Kathlen Mody  - IOP 19,18  today  - currently on cosopt BID OU  Ophthalmic Meds Ordered this visit:  Meds ordered this encounter  Medications   aflibercept (EYLEA) SOLN 2 mg     Return in about 4 weeks (around 12/01/2021) for Mild NPDR OU w/DFE/OCT/likely IVE OS.  There are no Patient Instructions on file for this visit.  This document serves as a record of services personally performed by Gardiner Sleeper, MD, PhD. It was created on their behalf by Leonie Douglas, an ophthalmic technician. The creation of this record is the provider's dictation and/or activities during the visit.    Electronically signed by: Leonie Douglas COA, 11/03/21  3:12 PM  This document serves as a record of services personally performed by Gardiner Sleeper, MD, PhD. It was created on their behalf by Estill Bakes, COT an ophthalmic technician. The creation of this record is the provider's dictation and/or activities during the visit.    Electronically signed by: Estill Bakes, COT 1.20.23 @ 3:12 PM   Gardiner Sleeper, M.D., Ph.D. Diseases & Surgery of the Retina and [place] 11/03/2021  I have reviewed the above documentation for accuracy and completeness, and I agree with the above. Gardiner Sleeper, M.D., Ph.D. 11/03/21 3:12 PM   Abbreviations: M myopia (nearsighted); A astigmatism; H hyperopia (farsighted); P presbyopia; Mrx spectacle prescription;  CTL contact lenses; OD right eye; OS left eye; OU both eyes  XT exotropia; ET esotropia; PEK punctate epithelial keratitis; PEE punctate epithelial erosions; DES dry eye syndrome; MGD meibomian gland dysfunction; ATs artificial tears; PFAT's preservative free artificial tears; [place] nuclear sclerotic cataract; PSC posterior subcapsular cataract; ERM epi-retinal membrane; PVD posterior vitreous detachment; RD retinal detachment; DM diabetes mellitus; DR diabetic retinopathy; NPDR non-proliferative diabetic retinopathy; PDR proliferative diabetic retinopathy; CSME  clinically significant macular edema; DME diabetic macular edema; dbh dot blot hemorrhages; CWS cotton wool spot; POAG primary open angle glaucoma; C/D cup-to-disc ratio; HVF humphrey visual field; GVF goldmann visual field; OCT optical coherence tomography; IOP intraocular pressure; BRVO Branch retinal vein occlusion; CRVO central retinal vein occlusion; CRAO central retinal artery occlusion; BRAO branch retinal artery occlusion; RT retinal tear; SB scleral buckle; PPV pars plana vitrectomy; VH Vitreous hemorrhage; PRP panretinal  laser photocoagulation; IVK intravitreal kenalog; VMT vitreomacular traction; MH Macular hole;  NVD neovascularization of the disc; NVE neovascularization elsewhere; AREDS age related eye disease study; ARMD age related macular degeneration; POAG primary open angle glaucoma; EBMD epithelial/anterior basement membrane dystrophy; ACIOL anterior chamber intraocular lens; IOL intraocular lens; PCIOL posterior chamber intraocular lens; Phaco/IOL phacoemulsification with intraocular lens placement; [place] photorefractive keratectomy; LASIK laser assisted in situ keratomileusis; HTN hypertension; DM diabetes mellitus; COPD chronic obstructive pulmonary disease

## 2021-11-10 ENCOUNTER — Ambulatory Visit (HOSPITAL_COMMUNITY): Payer: Medicare Other | Admitting: Anesthesiology

## 2021-11-10 ENCOUNTER — Encounter (HOSPITAL_COMMUNITY): Payer: Self-pay | Admitting: Nurse Practitioner

## 2021-11-10 ENCOUNTER — Encounter (HOSPITAL_COMMUNITY)
Admission: RE | Disposition: A | Discharge status: Home / Self Care | Payer: Self-pay | Source: Ambulatory Visit | Attending: Cardiology

## 2021-11-10 ENCOUNTER — Encounter (HOSPITAL_COMMUNITY): Payer: Self-pay | Admitting: Cardiology

## 2021-11-10 ENCOUNTER — Ambulatory Visit (HOSPITAL_BASED_OUTPATIENT_CLINIC_OR_DEPARTMENT_OTHER)
Admission: RE | Admit: 2021-11-10 | Discharge: 2021-11-10 | Disposition: A | Discharge status: Home / Self Care | Payer: Medicare Other | Source: Ambulatory Visit | Attending: Nurse Practitioner | Admitting: Nurse Practitioner

## 2021-11-10 ENCOUNTER — Other Ambulatory Visit: Payer: Self-pay

## 2021-11-10 ENCOUNTER — Ambulatory Visit (HOSPITAL_COMMUNITY)
Admission: RE | Admit: 2021-11-10 | Discharge: 2021-11-10 | Disposition: A | Discharge status: Home / Self Care | Payer: Medicare Other | Source: Ambulatory Visit | Attending: Cardiology | Admitting: Cardiology

## 2021-11-10 VITALS — BP 132/88 | HR 70 | Ht 70.0 in | Wt 251.0 lb

## 2021-11-10 DIAGNOSIS — D6869 Other thrombophilia: Secondary | ICD-10-CM

## 2021-11-10 DIAGNOSIS — Z6836 Body mass index (BMI) 36.0-36.9, adult: Secondary | ICD-10-CM | POA: Diagnosis not present

## 2021-11-10 DIAGNOSIS — I2510 Atherosclerotic heart disease of native coronary artery without angina pectoris: Secondary | ICD-10-CM | POA: Diagnosis not present

## 2021-11-10 DIAGNOSIS — I4891 Unspecified atrial fibrillation: Secondary | ICD-10-CM | POA: Diagnosis present

## 2021-11-10 DIAGNOSIS — Z7901 Long term (current) use of anticoagulants: Secondary | ICD-10-CM | POA: Diagnosis not present

## 2021-11-10 DIAGNOSIS — I4819 Other persistent atrial fibrillation: Secondary | ICD-10-CM

## 2021-11-10 DIAGNOSIS — Z87891 Personal history of nicotine dependence: Secondary | ICD-10-CM | POA: Insufficient documentation

## 2021-11-10 DIAGNOSIS — I5032 Chronic diastolic (congestive) heart failure: Secondary | ICD-10-CM | POA: Insufficient documentation

## 2021-11-10 DIAGNOSIS — H42 Glaucoma in diseases classified elsewhere: Secondary | ICD-10-CM | POA: Diagnosis not present

## 2021-11-10 DIAGNOSIS — E1139 Type 2 diabetes mellitus with other diabetic ophthalmic complication: Secondary | ICD-10-CM | POA: Insufficient documentation

## 2021-11-10 DIAGNOSIS — Z79899 Other long term (current) drug therapy: Secondary | ICD-10-CM | POA: Insufficient documentation

## 2021-11-10 DIAGNOSIS — I4510 Unspecified right bundle-branch block: Secondary | ICD-10-CM | POA: Insufficient documentation

## 2021-11-10 DIAGNOSIS — Z794 Long term (current) use of insulin: Secondary | ICD-10-CM | POA: Diagnosis not present

## 2021-11-10 DIAGNOSIS — Z7984 Long term (current) use of oral hypoglycemic drugs: Secondary | ICD-10-CM | POA: Insufficient documentation

## 2021-11-10 DIAGNOSIS — E113293 Type 2 diabetes mellitus with mild nonproliferative diabetic retinopathy without macular edema, bilateral: Secondary | ICD-10-CM | POA: Diagnosis not present

## 2021-11-10 DIAGNOSIS — G4733 Obstructive sleep apnea (adult) (pediatric): Secondary | ICD-10-CM | POA: Diagnosis not present

## 2021-11-10 DIAGNOSIS — I110 Hypertensive heart disease with heart failure: Secondary | ICD-10-CM | POA: Diagnosis not present

## 2021-11-10 HISTORY — PX: CARDIOVERSION: SHX1299

## 2021-11-10 LAB — BASIC METABOLIC PANEL
Anion gap: 10 (ref 5–15)
BUN: 18 mg/dL (ref 8–23)
CO2: 28 mmol/L (ref 22–32)
Calcium: 9.1 mg/dL (ref 8.9–10.3)
Chloride: 99 mmol/L (ref 98–111)
Creatinine, Ser: 1.12 mg/dL (ref 0.61–1.24)
GFR, Estimated: >60 mL/min (ref >60)
Glucose, Bld: 143 mg/dL — ABNORMAL HIGH (ref 70–99)
Potassium: 4.3 mmol/L (ref 3.5–5.1)
Sodium: 137 mmol/L (ref 135–145)

## 2021-11-10 LAB — CBC
HCT: 41 % (ref 39.0–52.0)
Hemoglobin: 13.1 g/dL (ref 13.0–17.0)
MCH: 29.2 pg (ref 26.0–34.0)
MCHC: 32 g/dL (ref 30.0–36.0)
MCV: 91.3 fL (ref 80.0–100.0)
Platelets: 288 10*3/uL (ref 150–400)
RBC: 4.49 MIL/uL (ref 4.22–5.81)
RDW: 15.7 % — ABNORMAL HIGH (ref 11.5–15.5)
WBC: 6.3 10*3/uL (ref 4.0–10.5)
nRBC: 0 % (ref 0.0–0.2)

## 2021-11-10 LAB — GLUCOSE, CAPILLARY: Glucose-Capillary: 119 mg/dL — ABNORMAL HIGH (ref 70–99)

## 2021-11-10 SURGERY — CARDIOVERSION
Anesthesia: General

## 2021-11-10 MED ORDER — SODIUM CHLORIDE 0.9 % IV SOLN: INTRAVENOUS | Status: DC

## 2021-11-10 MED ORDER — LIDOCAINE 2% (20 MG/ML) 5 ML SYRINGE
INTRAMUSCULAR | Status: DC | PRN
  Administered 2021-11-10: 60 mg via INTRAVENOUS

## 2021-11-10 MED ORDER — PROPOFOL 10 MG/ML IV BOLUS
INTRAVENOUS | Status: DC | PRN
  Administered 2021-11-10: 50 mg via INTRAVENOUS

## 2021-11-10 NOTE — Progress Notes (Signed)
Primary Care Physician: [name], MD Primary Cardiologist: Gwenlyn Found Primary Electrophysiologist: Genevie Ann is a 81 y.o. male with a history of persistent atrial fibrillation who presents for follow up in the [facility].  Since last being seen in clinic, the patient reports doing very well. afib burden low. No sustained AF. No bleeding issues on [place]. He did lose his wife unexpectedly a couple of months ago and is still grieving over this. He has chronic LLE, which he feels is at his baseline . He states that the legs go  down by the morning and fluid accumulates during the day.   F/u in afib clinic, 07/04/21. He underwent repeat ablation one month ago. He is in rate controlled afib. He states that he went to his PCP a few days after ablation and was in afib then and has persisted  since then. He feels like his weight is up 10 lbs, some from food consumption but feels his abdomen is swollen. He eats out almost every meal since his wife died. He wnet into persistent afib in the spring but was not ready for repeat ablation. He is not favorable to a cardioversion today. He feels very winded. +LLE. Previously failed tikosyn.   F/u in afib clinic, 07/12/21. He remains in rate controlled afib. He still does not want a cardioversion. I switched him to torsemide on last visit for some fluid/weight gain. Previously;y on 60 mg lasix daily. He states that it was expensive, his weight is up another 2 lbs and he feels that he did better on lasix. He states that his legs are much less swollen  in the am, and he feels good.   F/u in afib clinic, 12/14. He has been loading on amiodarone for one month and is ready for cardioversion. Inially, he said he did  not want another cardioversion but explained to pt that amiodarone is not achieving full effectiveness to restore SR  unless followed by cardioversion or else stop the drug. He is agreeable now to pursue cardioversion. No  missed anticoagulation. His fluid weight is staying normal.   F/u in afib clinic. 11/10/21, for pre procedural note for cardioversion after loading on amiodarone x the last several months. Pt did not initially want a repeat cardioversion but on last visit it was explained to pt we should stop amiodarone if he did not want to go thru with cardioversion to restore SR. He is her with his daughter today. They are both nervous regarding him developing acute pulmonary edema that happened after the cardioversion last fall, that resulted in admission  to the hospital. Today, he weighs 254 lbs dressed, but feels 5 lbs of this is shoes/clothes. He was 260 lbs when he went into the last cardioversion and he was much more short of breath going into the procedure. He was 241 when  he came out of the hospital  last fall, weight has climbed to the upper 240's gradually. No recent sudden weight gain.  He is rate controlled today. After discussing this, they are both willing to go thru with cardioversion. No missed anticoagulation.    Today, he  denies symptoms of palpitations, chest pain, +for shortness of breath,  and lower extremity edema, neg for dizziness, presyncope, syncope, snoring, daytime somnolence, bleeding, or neurologic sequela. The patient is tolerating medications without difficulties and is otherwise without complaint today.    Atrial Fibrillation Risk Factors:  he does have symptoms or diagnosis of sleep  apnea. he is compliant with CPAP therapy.  he does not have a history of rheumatic fever.  he does not have a history of alcohol use.  he has a BMI of Body mass index is 36.01 kg/m.[name] Filed Weights   11/10/21 1102  Weight: 113.9 kg    LA size: 40   Atrial Fibrillation Management history:  Previous antiarrhythmic drugs: Tikosyn  Previous cardioversions: 2016   Previous ablations: 2016. 06/06/21  CHADS2VASC score: 5  Anticoagulation history: Eliquis   Past Medical History:   Diagnosis Date   Arthritis    Atrial fibrillation (HCC)    CAD (coronary artery disease)    a. Cath 03/17/15 showing 100% ostial D1, 50% prox LAD to mid LAD, 40% RPDA stenosis. Med rx. // Myoview 01/2020: EF 31 diffuse perfusion defect without reversibility (suspect artifact); reviewed with Dr. 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[place])    OSA on CPAP 2012   Prostate cancer ([place])    Type II diabetes mellitus ([place])    Past Surgical History:  Procedure Laterality Date   ABDOMINAL HERNIA REPAIR     w/mesh   ATRIAL FIBRILLATION ABLATION N/A 06/06/2021   Procedure: ATRIAL FIBRILLATION ABLATION;  Surgeon: Thompson Grayer, MD;  Location: [facility];  Service: Cardiovascular;  Laterality: N/A;   CARDIAC CATHETERIZATION N/A 03/17/2015   Procedure: Left Heart Cath and Coronary Angiography;  Surgeon: Lorretta Harp, MD;  Location: [facility];  Service: Cardiovascular;  Laterality: N/A;   CARDIOVERSION N/A 08/09/2021   Procedure: CARDIOVERSION;  Surgeon: [name], MD;  Location: [facility];  Service: Cardiovascular;  Laterality: N/A;   carotid doppler  09/17/2008   rigt and left ICAs 0-49%;mildly  abnormal   CATARACT EXTRACTION Left 2020   Dr. Kathlen Mody   COLONOSCOPY N/A 05/16/2017   Procedure: COLONOSCOPY;  Surgeon: Rogene Houston, MD;  [facility];  Service: Endoscopy;  Laterality: N/A;  [place]  05/25/2009   EF 50-55%,LA mildly dilated, LV function normal   ELECTROPHYSIOLOGIC STUDY N/A 04/05/2015   Procedure: Cardioversion;  Surgeon: Sanda Klein, MD;  Location: [facility];  Service: Cardiovascular;   Laterality: N/A;   ELECTROPHYSIOLOGIC STUDY N/A 09/06/2015   Procedure: Atrial Fibrillation Ablation;  Surgeon: Thompson Grayer, MD;  Location: [facility];  Service: Cardiovascular;  Laterality: N/A;   ELECTROPHYSIOLOGIC STUDY N/A 07/12/2016   [facility] afib ablation by Dr Rayann Heman   EXCISIONAL HEMORRHOIDECTOMY     "inside and out"   EYE SURGERY Left 2020   Cat Sx - Dr. Kathlen Mody   FINE NEEDLE ASPIRATION Right    knee; "drew ~ 1 quart off"   [place] Right    "neck"   NM MYOCAR PERF WALL MOTION  02/21/2012   EF 61% ,EXERCISE 7 METS. exercise stopped due to wheezing and shortness of breathe   POLYPECTOMY  05/16/2017   Procedure: POLYPECTOMY;  Surgeon: Rogene Houston, MD;  [facility];  Service: Endoscopy;;  colon   PROSTATECTOMY     SHOULDER OPEN ROTATOR CUFF REPAIR Right X 2   TEE WITHOUT CARDIOVERSION N/A 09/05/2015   Procedure: TRANSESOPHAGEAL ECHOCARDIOGRAM (TEE);  Surgeon: Sueanne Margarita, MD;  Location: [facility];  Service: Cardiovascular;  Laterality: N/A;    Current Outpatient Medications  Medication Sig Dispense Refill   albuterol (VENTOLIN HFA) 108 (90 Base) MCG/ACT inhaler Inhale 2 puffs into the lungs every 4 (four) hours as needed for wheezing or shortness of breath.     amiodarone (PACERONE) 200 MG tablet Take 1 tablet (200 mg total) by mouth daily. 30 tablet 3   amLODipine (NORVASC) 10 MG tablet Take 1 tablet (10 mg total) by mouth at bedtime. 90 tablet 3   carvedilol (COREG) 6.25 MG tablet Takes 1 tablet by mouth in the AM and 1 tablet by mouth in the PM     Cholecalciferol (VITAMIN D-3 PO) Take 1,000 mg by mouth daily.     COMBIGAN 0.2-0.5 % ophthalmic solution Apply 1 drop to eye 2 (two) times daily.     diphenhydramine-acetaminophen (TYLENOL PM) 25-500 MG TABS tablet Take 2 tablets by mouth at bedtime.     Docusate Calcium (STOOL SOFTENER PO) Take 100 mg by mouth every evening.     ELIQUIS 5 MG  TABS tablet TAKE 1 TABLET BY MOUTH TWICE A DAY 180 tablet 1   furosemide (LASIX) 40 MG tablet TAKE 1 TABLET BY MOUTH TWICE A DAY 180 tablet 2   gemfibrozil (LOPID) 600 MG tablet Take 600 mg by mouth at bedtime.      glimepiride (AMARYL) 2 MG tablet Take 2 mg by mouth 2 (two) times daily.      Insulin Glargine (BASAGLAR KWIKPEN) 100 UNIT/ML Inject 35 Units into the skin at bedtime. (Patient taking differently: Inject 40 Units into the skin at bedtime.) 40 mL    lisinopril (ZESTRIL) 40 MG tablet Take 40 mg by mouth daily.     Melatonin 10 MG TABS Take 10 mg by mouth at bedtime.     metFORMIN (GLUCOPHAGE-XR) 500 MG 24 hr tablet Take 1,000 mg by mouth in the morning and at bedtime.     mineral oil liquid Take 15 mLs by mouth daily as needed for moderate constipation.     Multiple Vitamins-Minerals (MULTIVITAMIN WITH MINERALS) tablet Take 1 tablet by mouth daily. Centrum men 50+     Omega-3 1000 MG CAPS Take 1,000 mg by mouth daily.     potassium chloride SA (KLOR-CON M20) 20 MEQ tablet Take 1 tablet (20 mEq total) by mouth daily. 30 tablet 3   valACYclovir (VALTREX) 1000 MG tablet Take 1,000 mg by mouth at bedtime.     No current facility-administered medications for this encounter.    Allergies  Allergen Reactions   Tape Rash and Other (See Comments)    Causes skin redness, Use paper tape only.    Social History   Socioeconomic History   Marital status: Married    Spouse name: Not on file   Number of children: Not on file   Years of education: Not on file   Highest education level: Not on file  Occupational History   Occupation: Retired  Tobacco Use   Smoking status: Former    Packs/day: 2.00    Years: 27.00    Pack years: 54.00    Types: Cigarettes    Quit date: 10/31/1983    Years since quitting: 38.0   Smokeless tobacco: Never  Tobacco comments:    Former smoker 09/27/21  Vaping Use   Vaping Use: Never used  Substance and Sexual Activity   Alcohol use: No     Alcohol/week: 0.0 standard drinks    Comment: "used to drink; stopped ~ 2008"   Drug use: No   Sexual activity: Not on file  Other Topics Concern   Not on file  Social History Narrative   Lives in [place], [place] with wife.   Social Determinants of Health   Financial Resource Strain: Not on file  Food Insecurity: Not on file  Transportation Needs: Not on file  Physical Activity: Not on file  Stress: Not on file  Social Connections: Not on file  Intimate Partner Violence: Not on file    Family History  Problem Relation Age of Onset   Heart disease Mother    Lung cancer Mother    Heart attack Mother 62   Stroke Brother    Healthy Daughter    Diabetes Father    Heart disease Father     ROS- All systems are reviewed and negative except as per the HPI above.  Physical Exam: Vitals:   11/10/21 1102  BP: 132/88  Pulse: 70  Weight: 113.9 kg  Height: 5\' 10"  (1.778 m)    GEN- The patient is well appearing, alert and oriented x 3 today.   Head- normocephalic, atraumatic Eyes-  Sclera clear, conjunctiva pink Ears- hearing intact Oropharynx- clear Neck- supple  Lungs- Clear to ausculation bilaterally, normal work of breathing Heart- irregular rate and rhythm  GI- soft, NT, ND, + BS Extremities- no clubbing, cyanosis, or  2+ edema  MS- no significant deformity or atrophy Skin- no rash or lesion Psych- euthymic mood, full affect Neuro- strength and sensation are intact  Wt Readings from Last 3 Encounters:  11/10/21 113.9 kg  09/27/21 110.7 kg  08/22/21 111.8 kg    EKG today demonstrates  afib  at 70 bpm, qrs int 108 ms,s qtc 451 ms   Epic records are reviewed at length today  Assessment and Plan:  1. Persistent atrial fibrillation  S/p ablation second ablation in August 2022 and SR only lasted a few days He started amiodarone 11/8 to pursue cardioversion and restore SR He initially did not want to f/u with cardioversion but has now consented He is scheduled for  this am  Risk vrs benefit discussed   He had flsh pulmonary edema after last cardioversion but fluid status seems to be close to his baseline today., weight upper 240's at home, with clothes today, 25 4lbs.  He did not take 40 mg lasix this am but has his 40 mg lasix in his pocket to take after cardioversion, he has been compliant taking 40 mg bid.  Continue Eliquis 5 mg bid  for CHADS2VASC of 5,states no missed doses for at least 21 days  Cbc/bmet done today   2. HTN Stable   3. Obstructive sleep apnea The patient reports compliance with CPAP   4. Combined systolic /diastolic HF   Normovolemic  Continue with current lasix dose Avoid salt, daily weights  F/u here 1-2 weeks after cardioversion   Roderic Palau, NP 11/10/2021 11:10 AM

## 2021-11-10 NOTE — Transfer of Care (Signed)
Immediate Anesthesia Transfer of Care Note  Patient: [name]  Procedure(s) Performed: CARDIOVERSION  Patient Location: Endoscopy Unit  Anesthesia Type:General  Level of Consciousness: awake, alert  and oriented  Airway & Oxygen Therapy: Patient Spontanous Breathing  Post-op Assessment: Report given to RN  Post vital signs: Reviewed and stable  Last Vitals:  Vitals Value Taken Time  BP 129/64   Temp    Pulse 60   Resp 17   SpO2 98     Last Pain:  Vitals:   11/10/21 1206  TempSrc: Temporal  PainSc: 0-No pain         Complications: No notable events documented.

## 2021-11-10 NOTE — Interval H&P Note (Signed)
History and Physical Interval Note:  11/10/2021 12:07 PM  Lucas Dudley  has presented today for surgery, with the diagnosis of AFIB.  The various methods of treatment have been discussed with the patient and family. After consideration of risks, benefits and other options for treatment, the patient has consented to  Procedure(s): CARDIOVERSION (N/A) as a surgical intervention.  The patient's history has been reviewed, patient examined, no change in status, stable for surgery.  I have reviewed the patient's chart and labs.  Questions were answered to the patient's satisfaction.     [name]

## 2021-11-10 NOTE — Discharge Instructions (Signed)

## 2021-11-10 NOTE — H&P (View-Only) (Signed)
Primary Care Physician: [name], MD Primary Cardiologist: Gwenlyn Found Primary Electrophysiologist: Genevie Ann is a 81 y.o. male with a history of persistent atrial fibrillation who presents for follow up in the [facility].  Since last being seen in clinic, the patient reports doing very well. afib burden low. No sustained AF. No bleeding issues on [place]. He did lose his wife unexpectedly a couple of months ago and is still grieving over this. He has chronic LLE, which he feels is at his baseline . He states that the legs go  down by the morning and fluid accumulates during the day.   F/u in afib clinic, 07/04/21. He underwent repeat ablation one month ago. He is in rate controlled afib. He states that he went to his PCP a few days after ablation and was in afib then and has persisted  since then. He feels like his weight is up 10 lbs, some from food consumption but feels his abdomen is swollen. He eats out almost every meal since his wife died. He wnet into persistent afib in the spring but was not ready for repeat ablation. He is not favorable to a cardioversion today. He feels very winded. +LLE. Previously failed tikosyn.   F/u in afib clinic, 07/12/21. He remains in rate controlled afib. He still does not want a cardioversion. I switched him to torsemide on last visit for some fluid/weight gain. Previously;y on 60 mg lasix daily. He states that it was expensive, his weight is up another 2 lbs and he feels that he did better on lasix. He states that his legs are much less swollen  in the am, and he feels good.   F/u in afib clinic, 12/14. He has been loading on amiodarone for one month and is ready for cardioversion. Inially, he said he did  not want another cardioversion but explained to pt that amiodarone is not achieving full effectiveness to restore SR  unless followed by cardioversion or else stop the drug. He is agreeable now to pursue cardioversion. No  missed anticoagulation. His fluid weight is staying normal.   F/u in afib clinic. 11/10/21, for pre procedural note for cardioversion after loading on amiodarone x the last several months. Pt did not initially want a repeat cardioversion but on last visit it was explained to pt we should stop amiodarone if he did not want to go thru with cardioversion to restore SR. He is her with his daughter today. They are both nervous regarding him developing acute pulmonary edema that happened after the cardioversion last fall, that resulted in admission  to the hospital. Today, he weighs 254 lbs dressed, but feels 5 lbs of this is shoes/clothes. He was 260 lbs when he went into the last cardioversion and he was much more short of breath going into the procedure. He was 241 when  he came out of the hospital  last fall, weight has climbed to the upper 240's gradually. No recent sudden weight gain.  He is rate controlled today. After discussing this, they are both willing to go thru with cardioversion. No missed anticoagulation.    Today, he  denies symptoms of palpitations, chest pain, +for shortness of breath,  and lower extremity edema, neg for dizziness, presyncope, syncope, snoring, daytime somnolence, bleeding, or neurologic sequela. The patient is tolerating medications without difficulties and is otherwise without complaint today.    Atrial Fibrillation Risk Factors:  he does have symptoms or diagnosis of sleep  apnea. he is compliant with CPAP therapy.  he does not have a history of rheumatic fever.  he does not have a history of alcohol use.  he has a BMI of Body mass index is 36.01 kg/m.[name] Filed Weights   11/10/21 1102  Weight: 113.9 kg    LA size: 40   Atrial Fibrillation Management history:  Previous antiarrhythmic drugs: Tikosyn  Previous cardioversions: 2016   Previous ablations: 2016. 06/06/21  CHADS2VASC score: 5  Anticoagulation history: Eliquis   Past Medical History:   Diagnosis Date   Arthritis    Atrial fibrillation (HCC)    CAD (coronary artery disease)    a. Cath 03/17/15 showing 100% ostial D1, 50% prox LAD to mid LAD, 40% RPDA stenosis. Med rx. // Myoview 01/2020: EF 31 diffuse perfusion defect without reversibility (suspect artifact); reviewed with Dr. Aggie Cosier study felt to be low risk   Cataract    Mixed form OD   Chronic diastolic CHF (congestive heart failure) (HCC)    Diabetic retinopathy ([place])    NPDR OU   Essential hypertension    Glaucoma    POAG OU   History of gout    Hyperlipidemia    Hypertensive retinopathy    OU   Kidney stones    Melanoma of neck (HCC)    NICM (nonischemic cardiomyopathy) ([place])    OSA on CPAP 2012   Prostate cancer ([place])    Type II diabetes mellitus ([place])    Past Surgical History:  Procedure Laterality Date   ABDOMINAL HERNIA REPAIR     w/mesh   ATRIAL FIBRILLATION ABLATION N/A 06/06/2021   Procedure: ATRIAL FIBRILLATION ABLATION;  Surgeon: Thompson Grayer, MD;  Location: [facility];  Service: Cardiovascular;  Laterality: N/A;   CARDIAC CATHETERIZATION N/A 03/17/2015   Procedure: Left Heart Cath and Coronary Angiography;  Surgeon: Lorretta Harp, MD;  Location: [facility];  Service: Cardiovascular;  Laterality: N/A;   CARDIOVERSION N/A 08/09/2021   Procedure: CARDIOVERSION;  Surgeon: [name], MD;  Location: [facility];  Service: Cardiovascular;  Laterality: N/A;   carotid doppler  09/17/2008   rigt and left ICAs 0-49%;mildly  abnormal   CATARACT EXTRACTION Left 2020   Dr. Kathlen Mody   COLONOSCOPY N/A 05/16/2017   Procedure: COLONOSCOPY;  Surgeon: Rogene Houston, MD;  [facility];  Service: Endoscopy;  Laterality: N/A;  [place]  05/25/2009   EF 50-55%,LA mildly dilated, LV function normal   ELECTROPHYSIOLOGIC STUDY N/A 04/05/2015   Procedure: Cardioversion;  Surgeon: Sanda Klein, MD;  Location: [facility];  Service: Cardiovascular;   Laterality: N/A;   ELECTROPHYSIOLOGIC STUDY N/A 09/06/2015   Procedure: Atrial Fibrillation Ablation;  Surgeon: Thompson Grayer, MD;  Location: [facility];  Service: Cardiovascular;  Laterality: N/A;   ELECTROPHYSIOLOGIC STUDY N/A 07/12/2016   redo afib ablation by Dr Rayann Heman   EXCISIONAL HEMORRHOIDECTOMY     "inside and out"   EYE SURGERY Left 2020   Cat Sx - Dr. Kathlen Mody   FINE NEEDLE ASPIRATION Right    knee; "drew ~ 1 quart off"   [place] Right    "neck"   NM MYOCAR PERF WALL MOTION  02/21/2012   EF 61% ,EXERCISE 7 METS. exercise stopped due to wheezing and shortness of breathe   POLYPECTOMY  05/16/2017   Procedure: POLYPECTOMY;  Surgeon: Rogene Houston, MD;  [facility];  Service: Endoscopy;;  colon   PROSTATECTOMY     SHOULDER OPEN ROTATOR CUFF REPAIR Right X 2   TEE WITHOUT CARDIOVERSION N/A 09/05/2015   Procedure: TRANSESOPHAGEAL ECHOCARDIOGRAM (TEE);  Surgeon: Sueanne Margarita, MD;  [facility];  Service: Cardiovascular;  Laterality: N/A;    Current Outpatient Medications  Medication Sig Dispense Refill   albuterol (VENTOLIN HFA) 108 (90 Base) MCG/ACT inhaler Inhale 2 puffs into the lungs every 4 (four) hours as needed for wheezing or shortness of breath.     amiodarone (PACERONE) 200 MG tablet Take 1 tablet (200 mg total) by mouth daily. 30 tablet 3   amLODipine (NORVASC) 10 MG tablet Take 1 tablet (10 mg total) by mouth at bedtime. 90 tablet 3   carvedilol (COREG) 6.25 MG tablet Takes 1 tablet by mouth in the AM and 1 tablet by mouth in the PM     Cholecalciferol (VITAMIN D-3 PO) Take 1,000 mg by mouth daily.     COMBIGAN 0.2-0.5 % ophthalmic solution Apply 1 drop to eye 2 (two) times daily.     diphenhydramine-acetaminophen (TYLENOL PM) 25-500 MG TABS tablet Take 2 tablets by mouth at bedtime.     Docusate Calcium (STOOL SOFTENER PO) Take 100 mg by mouth every evening.     ELIQUIS 5 MG  TABS tablet TAKE 1 TABLET BY MOUTH TWICE A DAY 180 tablet 1   furosemide (LASIX) 40 MG tablet TAKE 1 TABLET BY MOUTH TWICE A DAY 180 tablet 2   gemfibrozil (LOPID) 600 MG tablet Take 600 mg by mouth at bedtime.      glimepiride (AMARYL) 2 MG tablet Take 2 mg by mouth 2 (two) times daily.      Insulin Glargine (BASAGLAR KWIKPEN) 100 UNIT/ML Inject 35 Units into the skin at bedtime. (Patient taking differently: Inject 40 Units into the skin at bedtime.) 40 mL    lisinopril (ZESTRIL) 40 MG tablet Take 40 mg by mouth daily.     Melatonin 10 MG TABS Take 10 mg by mouth at bedtime.     metFORMIN (GLUCOPHAGE-XR) 500 MG 24 hr tablet Take 1,000 mg by mouth in the morning and at bedtime.     mineral oil liquid Take 15 mLs by mouth daily as needed for moderate constipation.     Multiple Vitamins-Minerals (MULTIVITAMIN WITH MINERALS) tablet Take 1 tablet by mouth daily. Centrum men 50+     Omega-3 1000 MG CAPS Take 1,000 mg by mouth daily.     potassium chloride SA (KLOR-CON M20) 20 MEQ tablet Take 1 tablet (20 mEq total) by mouth daily. 30 tablet 3   valACYclovir (VALTREX) 1000 MG tablet Take 1,000 mg by mouth at bedtime.     No current facility-administered medications for this encounter.    Allergies  Allergen Reactions   Tape Rash and Other (See Comments)    Causes skin redness, Use paper tape only.    Social History   Socioeconomic History   Marital status: Married    Spouse name: Not on file   Number of children: Not on file   Years of education: Not on file   Highest education level: Not on file  Occupational History   Occupation: Retired  Tobacco Use   Smoking status: Former    Packs/day: 2.00    Years: 27.00    Pack years: 54.00    Types: Cigarettes    Quit date: 10/31/1983    Years since quitting: 38.0   Smokeless tobacco: Never  Tobacco comments:    Former smoker 09/27/21  Vaping Use   Vaping Use: Never used  Substance and Sexual Activity   Alcohol use: No     Alcohol/week: 0.0 standard drinks    Comment: "used to drink; stopped ~ 2008"   Drug use: No   Sexual activity: Not on file  Other Topics Concern   Not on file  Social History Narrative   Lives in [place], [place] with wife.   Social Determinants of Health   Financial Resource Strain: Not on file  Food Insecurity: Not on file  Transportation Needs: Not on file  Physical Activity: Not on file  Stress: Not on file  Social Connections: Not on file  Intimate Partner Violence: Not on file    Family History  Problem Relation Age of Onset   Heart disease Mother    Lung cancer Mother    Heart attack Mother 34   Stroke Brother    Healthy Daughter    Diabetes Father    Heart disease Father     ROS- All systems are reviewed and negative except as per the HPI above.  Physical Exam: Vitals:   11/10/21 1102  BP: 132/88  Pulse: 70  Weight: 113.9 kg  Height: 5\' 10"  (1.778 m)    GEN- The patient is well appearing, alert and oriented x 3 today.   Head- normocephalic, atraumatic Eyes-  Sclera clear, conjunctiva pink Ears- hearing intact Oropharynx- clear Neck- supple  Lungs- Clear to ausculation bilaterally, normal work of breathing Heart- irregular rate and rhythm  GI- soft, NT, ND, + BS Extremities- no clubbing, cyanosis, or  2+ edema  MS- no significant deformity or atrophy Skin- no rash or lesion Psych- euthymic mood, full affect Neuro- strength and sensation are intact  Wt Readings from Last 3 Encounters:  11/10/21 113.9 kg  09/27/21 110.7 kg  08/22/21 111.8 kg    EKG today demonstrates  afib  at 70 bpm, qrs int 108 ms,s qtc 451 ms   Epic records are reviewed at length today  Assessment and Plan:  1. Persistent atrial fibrillation  S/p ablation second ablation in August 2022 and SR only lasted a few days He started amiodarone 11/8 to pursue cardioversion and restore SR He initially did not want to f/u with cardioversion but has now consented He is scheduled for  this am  Risk vrs benefit discussed   He had flsh pulmonary edema after last cardioversion but fluid status seems to be close to his baseline today., weight upper 240's at home, with clothes today, 25 4lbs.  He did not take 40 mg lasix this am but has his 40 mg lasix in his pocket to take after cardioversion, he has been compliant taking 40 mg bid.  Continue Eliquis 5 mg bid  for CHADS2VASC of 5,states no missed doses for at least 21 days  Cbc/bmet done today   2. HTN Stable   3. Obstructive sleep apnea The patient reports compliance with CPAP   4. Combined systolic /diastolic HF   Normovolemic  Continue with current lasix dose Avoid salt, daily weights  F/u here 1-2 weeks after cardioversion   Roderic Palau, NP 11/10/2021 11:10 AM

## 2021-11-10 NOTE — Anesthesia Postprocedure Evaluation (Signed)
Anesthesia Post Note  Patient: Lucas Dudley  Procedure(s) Performed: CARDIOVERSION     Patient location during evaluation: Endoscopy Anesthesia Type: General Level of consciousness: awake and alert Pain management: pain level controlled Vital Signs Assessment: post-procedure vital signs reviewed and stable Respiratory status: spontaneous breathing, nonlabored ventilation and respiratory function stable Cardiovascular status: blood pressure returned to baseline and stable Postop Assessment: no apparent nausea or vomiting Anesthetic complications: no   No notable events documented.  Last Vitals:  Vitals:   11/10/21 1322 11/10/21 1330  BP: (!) 126/54 137/60  Pulse: (!) 58 62  Resp: 19 20  Temp:    SpO2: 93% 97%    Last Pain:  Vitals:   11/10/21 1310  TempSrc: Temporal  PainSc: 0-No pain                 [name],[name]

## 2021-11-10 NOTE — Anesthesia Preprocedure Evaluation (Addendum)
Anesthesia Evaluation  Patient identified by MRN, date of birth, ID band Patient awake    Reviewed: Allergy & Precautions, H&P , NPO status , Patient's Chart, lab work & pertinent test results, reviewed documented beta blocker date and time   Airway Mallampati: II  TM Distance: >3 FB Neck ROM: Full    Dental no notable dental hx. (+) Edentulous Upper, Edentulous Lower, Dental Advisory Given   Pulmonary sleep apnea , former smoker,    Pulmonary exam normal breath sounds clear to auscultation       Cardiovascular hypertension, Pt. on medications and Pt. on home beta blockers + CAD and +CHF  + dysrhythmias Atrial Fibrillation  Rhythm:Irregular Rate:Normal     Neuro/Psych negative neurological ROS  negative psych ROS   GI/Hepatic negative GI ROS, Neg liver ROS,   Endo/Other  diabetes, Type 2, Oral Hypoglycemic AgentsMorbid obesity  Renal/GU Renal InsufficiencyRenal disease  negative genitourinary   Musculoskeletal  (+) Arthritis , Osteoarthritis,    Abdominal   Peds  Hematology negative hematology ROS (+)   Anesthesia Other Findings   Reproductive/Obstetrics negative OB ROS                            Anesthesia Physical Anesthesia Plan  ASA: 3  Anesthesia Plan: General   Post-op Pain Management: Minimal or no pain anticipated   Induction: Intravenous  PONV Risk Score and Plan: 2 and Propofol infusion and Treatment may vary due to age or medical condition  Airway Management Planned: Mask and Natural Airway  Additional Equipment:   Intra-op Plan:   Post-operative Plan:   Informed Consent: I have reviewed the patients History and Physical, chart, labs and discussed the procedure including the risks, benefits and alternatives for the proposed anesthesia with the patient or authorized representative who has indicated his/her understanding and acceptance.     Dental advisory  given  Plan Discussed with: CRNA  Anesthesia Plan Comments:         Anesthesia Quick Evaluation

## 2021-11-10 NOTE — CV Procedure (Signed)
° °  Electrical Cardioversion Procedure Note Lucas Dudley [id number] [birth year]  Procedure: Electrical Cardioversion Indications:  Atrial Fibrillation  Time Out: Verified patient identification, verified procedure,medications/allergies/relevent history reviewed, required imaging and test results available.  Performed  Procedure Details  The patient signed informed consent.   The patient was NPO past midnight. Has had therapeutic anticoagulation with Eliquis greater than 3 weeks. The patient denies any interruption of anticoagulation.  Anesthesia was administered by the anesthesiology team.  Adequate airway was maintained throughout and vital followed per protocol.  He was cardioverted x 1 with 200 J of biphasic synchronized energy.  He converted to NSR.  There were no apparent complications.  The patient tolerated the procedure well and had normal neuro status and respiratory status post procedure with vitals stable as recorded elsewhere.     IMPRESSION:  Successful cardioversion of atrial fibrillation to sinus bradycardia.   Follow up:  We will arrange follow up with primary cardiologist  He will continue on current medical therapy.  The patient advised to continue anticoagulation.  Lucas Dudley 11/10/2021, 1:07 PM

## 2021-11-11 ENCOUNTER — Encounter (HOSPITAL_COMMUNITY): Payer: Self-pay | Admitting: Cardiology

## 2021-11-14 ENCOUNTER — Telehealth (HOSPITAL_COMMUNITY): Payer: Self-pay | Admitting: *Deleted

## 2021-11-14 NOTE — Telephone Encounter (Signed)
Patient daughter called in stating increased shortness of breath since cardioversion only worsening. He took an extra 20mg  of lasix over weekend with some relief but symptoms returned. Denied weight gain but having abdominal bloating. Discussed with Roderic Palau NP will increase lasix to 60mg  BID today and tomorrow with follow up Thursday. Pt daughter in agreement.

## 2021-11-16 ENCOUNTER — Ambulatory Visit (HOSPITAL_COMMUNITY)
Admission: RE | Admit: 2021-11-16 | Discharge: 2021-11-16 | Disposition: A | Discharge status: Home / Self Care | Payer: Medicare Other | Source: Ambulatory Visit | Attending: Nurse Practitioner | Admitting: Nurse Practitioner

## 2021-11-16 ENCOUNTER — Other Ambulatory Visit: Payer: Self-pay

## 2021-11-16 VITALS — BP 136/64 | HR 52 | Ht 70.0 in | Wt 252.6 lb

## 2021-11-16 DIAGNOSIS — G4733 Obstructive sleep apnea (adult) (pediatric): Secondary | ICD-10-CM | POA: Insufficient documentation

## 2021-11-16 DIAGNOSIS — Z79899 Other long term (current) drug therapy: Secondary | ICD-10-CM | POA: Diagnosis not present

## 2021-11-16 DIAGNOSIS — I110 Hypertensive heart disease with heart failure: Secondary | ICD-10-CM | POA: Diagnosis not present

## 2021-11-16 DIAGNOSIS — D6869 Other thrombophilia: Secondary | ICD-10-CM | POA: Diagnosis not present

## 2021-11-16 DIAGNOSIS — I4819 Other persistent atrial fibrillation: Secondary | ICD-10-CM

## 2021-11-16 DIAGNOSIS — Z7901 Long term (current) use of anticoagulants: Secondary | ICD-10-CM | POA: Insufficient documentation

## 2021-11-16 DIAGNOSIS — I5032 Chronic diastolic (congestive) heart failure: Secondary | ICD-10-CM | POA: Diagnosis not present

## 2021-11-16 LAB — BASIC METABOLIC PANEL
Anion gap: 14 (ref 5–15)
BUN: 19 mg/dL (ref 8–23)
CO2: 28 mmol/L (ref 22–32)
Calcium: 8.9 mg/dL (ref 8.9–10.3)
Chloride: 97 mmol/L — ABNORMAL LOW (ref 98–111)
Creatinine, Ser: 0.99 mg/dL (ref 0.61–1.24)
GFR, Estimated: >60 mL/min (ref >60)
Glucose, Bld: 171 mg/dL — ABNORMAL HIGH (ref 70–99)
Potassium: 3.9 mmol/L (ref 3.5–5.1)
Sodium: 139 mmol/L (ref 135–145)

## 2021-11-16 NOTE — Progress Notes (Signed)
Primary Care Physician: [name], MD Primary Cardiologist: Gwenlyn Found Primary Electrophysiologist: Genevie Ann is a 81 y.o. male with a history of persistent atrial fibrillation who presents for follow up in the [facility].  Since last being seen in clinic, the patient reports doing very well. afib burden low. No sustained AF. No bleeding issues on [place]. He did lose his wife unexpectedly a couple of months ago and is still grieving over this. He has chronic LLE, which he feels is at his baseline . He states that the legs go  down by the morning and fluid accumulates during the day.   F/u in afib clinic, 07/04/21. He underwent repeat ablation one month ago. He is in rate controlled afib. He states that he went to his PCP a few days after ablation and was in afib then and has persisted  since then. He feels like his weight is up 10 lbs, some from food consumption but feels his abdomen is swollen. He eats out almost every meal since his wife died. He wnet into persistent afib in the spring but was not ready for repeat ablation. He is not favorable to a cardioversion today. He feels very winded. +LLE. Previously failed tikosyn.   F/u in afib clinic, 07/12/21. He remains in rate controlled afib. He still does not want a cardioversion. I switched him to torsemide on last visit for some fluid/weight gain. Previously;y on 60 mg lasix daily. He states that it was expensive, his weight is up another 2 lbs and he feels that he did better on lasix. He states that his legs are much less swollen  in the am, and he feels good.   F/u in afib clinic, 12/14. He has been loading on amiodarone for one month and is ready for cardioversion. Inially, he said he did  not want another cardioversion but explained to pt that amiodarone is not achieving full effectiveness to restore SR  unless followed by cardioversion or else stop the drug. He is agreeable now to pursue cardioversion. No  missed anticoagulation. His fluid weight is staying normal.   F/u in afib clinic. 11/10/21, for pre procedural note for cardioversion after loading on amiodarone x the last several months. Pt did not initially want a repeat cardioversion but on last visit it was explained to pt we should stop amiodarone if he did not want to go thru with cardioversion to restore SR. He is her with his daughter today. They are both nervous regarding him developing acute pulmonary edema that happened after the cardioversion last fall, that resulted in admission  to the hospital. Today, he weighs 254 lbs dressed, but feels 5 lbs of this is shoes/clothes. He was 260 lbs when he went into the last cardioversion and he was much more short of breath going into the procedure. He was 241 when  he came out of the hospital  last fall, weight has climbed to the upper 240's gradually. No recent sudden weight gain.  He is rate controlled today. After discussing this, they are both willing to go thru with cardioversion. No missed anticoagulation.    F/u in afib clinic,s/p successful cardioversion, after loading on amiodarone for over one month. He remains in SR. He called after cardioversion feeling like he was retaining some fluid. I increased lasix to 60 mg bid for several days and he's weight is down 2 lbs today and he feels better. He will go back to Lasix 40 mg  bid tomorrow. Bmet today.   Today, he  denies symptoms of palpitations, chest pain, +for shortness of breath,  and lower extremity edema, neg for dizziness, presyncope, syncope, snoring, daytime somnolence, bleeding, or neurologic sequela. The patient is tolerating medications without difficulties and is otherwise without complaint today.    Atrial Fibrillation Risk Factors:  he does have symptoms or diagnosis of sleep apnea. he is compliant with CPAP therapy.  he does not have a history of rheumatic fever.  he does not have a history of alcohol use.  he has a BMI of  Body mass index is 36.24 kg/m.[name] Filed Weights   11/16/21 1059  Weight: 114.6 kg    LA size: 40   Atrial Fibrillation Management history:  Previous antiarrhythmic drugs: Tikosyn  Previous cardioversions: 2016   Previous ablations: 2016. 06/06/21  CHADS2VASC score: 5  Anticoagulation history: Eliquis   Past Medical History:  Diagnosis Date   Arthritis    Atrial fibrillation (HCC)    CAD (coronary artery disease)    a. Cath 03/17/15 showing 100% ostial D1, 50% prox LAD to mid LAD, 40% RPDA stenosis. Med rx. // Myoview 01/2020: EF 31 diffuse perfusion defect without reversibility (suspect artifact); reviewed with Dr. Aggie Cosier study felt to be low risk   Cataract    Mixed form OD   Chronic diastolic CHF (congestive heart failure) (HCC)    Diabetic retinopathy ([place])    NPDR OU   Essential hypertension    Glaucoma    POAG OU   History of gout    Hyperlipidemia    Hypertensive retinopathy    OU   Kidney stones    Melanoma of neck (HCC)    NICM (nonischemic cardiomyopathy) ([place])    OSA on CPAP 2012   Prostate cancer ([place])    Type II diabetes mellitus ([place])    Past Surgical History:  Procedure Laterality Date   ABDOMINAL HERNIA REPAIR     w/mesh   ATRIAL FIBRILLATION ABLATION N/A 06/06/2021   Procedure: ATRIAL FIBRILLATION ABLATION;  Surgeon: Thompson Grayer, MD;  Location: [facility] [facility];  Service: Cardiovascular;  Laterality: N/A;   CARDIAC CATHETERIZATION N/A 03/17/2015   Procedure: Left Heart Cath and Coronary Angiography;  Surgeon: Lorretta Harp, MD;  Location: [facility];  Service: Cardiovascular;  Laterality: N/A;   CARDIOVERSION N/A 08/09/2021   Procedure: CARDIOVERSION;  Surgeon: [name], MD;  Location: [facility];  Service: Cardiovascular;  Laterality: N/A;   CARDIOVERSION N/A 11/10/2021   Procedure: CARDIOVERSION;  Surgeon: Berniece Salines, DO;  Location: [facility];  Service: Cardiovascular;  Laterality: N/A;   carotid doppler   09/17/2008   rigt and left ICAs 0-49%;mildly  abnormal   CATARACT EXTRACTION Left 2020   Dr. Kathlen Mody   COLONOSCOPY N/A 05/16/2017   Procedure: COLONOSCOPY;  Surgeon: Rogene Houston, MD;  [facility];  Service: Endoscopy;  Laterality: N/A;  [place]  05/25/2009   EF 50-55%,LA mildly dilated, LV function normal   ELECTROPHYSIOLOGIC STUDY N/A 04/05/2015   Procedure: Cardioversion;  Surgeon: Sanda Klein, MD;  Location: [facility];  Service: Cardiovascular;  Laterality: N/A;   ELECTROPHYSIOLOGIC STUDY N/A 09/06/2015   Procedure: Atrial Fibrillation Ablation;  Surgeon: Thompson Grayer, MD;  Location: [place];  Service: Cardiovascular;  Laterality: N/A;   ELECTROPHYSIOLOGIC STUDY N/A 07/12/2016   redo afib ablation by Dr Rayann Heman   EXCISIONAL HEMORRHOIDECTOMY     "inside and out"   EYE SURGERY  Left 2020   Cat Sx - Dr. Kathlen Mody   FINE NEEDLE ASPIRATION Right    knee; "drew ~ 1 quart off"   HERNIA REPAIR     LAPAROSCOPIC CHOLECYSTECTOMY     MELANOMA EXCISION Right    "neck"   NM MYOCAR PERF WALL MOTION  02/21/2012   EF 61% ,EXERCISE 7 METS. exercise stopped due to wheezing and shortness of breathe   POLYPECTOMY  05/16/2017   Procedure: POLYPECTOMY;  Surgeon: Rogene Houston, MD;  [facility];  Service: Endoscopy;;  colon   PROSTATECTOMY     SHOULDER OPEN ROTATOR CUFF REPAIR Right X 2   TEE WITHOUT CARDIOVERSION N/A 09/05/2015   Procedure: TRANSESOPHAGEAL ECHOCARDIOGRAM (TEE);  Surgeon: Sueanne Margarita, MD;  Location: [facility];  Service: Cardiovascular;  Laterality: N/A;    Current Outpatient Medications  Medication Sig Dispense Refill   albuterol (VENTOLIN HFA) 108 (90 Base) MCG/ACT inhaler Inhale 2 puffs into the lungs every 4 (four) hours as needed for wheezing or shortness of breath.     amiodarone (PACERONE) 200 MG tablet Take 1 tablet (200 mg total) by mouth daily. 30 tablet 3   amLODipine (NORVASC) 10 MG tablet Take 1  tablet (10 mg total) by mouth at bedtime. 90 tablet 3   carvedilol (COREG) 6.25 MG tablet Takes 1 tablet by mouth in the AM and 1 tablet by mouth in the PM     Cholecalciferol (VITAMIN D-3 PO) Take 1,000 mg by mouth daily.     COMBIGAN 0.2-0.5 % ophthalmic solution Apply 1 drop to eye 2 (two) times daily.     diphenhydramine-acetaminophen (TYLENOL PM) 25-500 MG TABS tablet Take 2 tablets by mouth at bedtime.     Docusate Calcium (STOOL SOFTENER PO) Take 100 mg by mouth every evening.     ELIQUIS 5 MG TABS tablet TAKE 1 TABLET BY MOUTH TWICE A DAY 180 tablet 1   furosemide (LASIX) 40 MG tablet TAKE 1 TABLET BY MOUTH TWICE A DAY 180 tablet 2   gemfibrozil (LOPID) 600 MG tablet Take 600 mg by mouth at bedtime.      glimepiride (AMARYL) 2 MG tablet Take 2 mg by mouth 2 (two) times daily.      Insulin Glargine (BASAGLAR KWIKPEN) 100 UNIT/ML Inject 35 Units into the skin at bedtime. (Patient taking differently: Inject 40 Units into the skin at bedtime.) 40 mL    lisinopril (ZESTRIL) 40 MG tablet Take 40 mg by mouth daily.     Melatonin 10 MG TABS Take 10 mg by mouth at bedtime.     metFORMIN (GLUCOPHAGE-XR) 500 MG 24 hr tablet Take 1,000 mg by mouth in the morning and at bedtime.     mineral oil liquid Take 15 mLs by mouth daily as needed for moderate constipation.     Multiple Vitamins-Minerals (MULTIVITAMIN WITH MINERALS) tablet Take 1 tablet by mouth daily. Centrum men 50+     Omega-3 1000 MG CAPS Take 1,000 mg by mouth daily.     potassium chloride SA (KLOR-CON M20) 20 MEQ tablet Take 1 tablet (20 mEq total) by mouth daily. 30 tablet 3   valACYclovir (VALTREX) 1000 MG tablet Take 1,000 mg by mouth at bedtime.     No current facility-administered medications for this encounter.    Allergies  Allergen Reactions   Azithromycin Nausea Only   Tape Rash and Other (See Comments)    Causes skin redness, Use paper tape only.    Social History  Socioeconomic History   Marital status: Married     Spouse name: Not on file   Number of children: Not on file   Years of education: Not on file   Highest education level: Not on file  Occupational History   Occupation: Retired  Tobacco Use   Smoking status: Former    Packs/day: 2.00    Years: 27.00    Pack years: 54.00    Types: Cigarettes    Quit date: 10/31/1983    Years since quitting: 38.0   Smokeless tobacco: Never   Tobacco comments:    Former smoker 09/27/21  Vaping Use   Vaping Use: Never used  Substance and Sexual Activity   Alcohol use: No    Alcohol/week: 0.0 standard drinks    Comment: "used to drink; stopped ~ 2008"   Drug use: No   Sexual activity: Not on file  Other Topics Concern   Not on file  Social History Narrative   Lives in [place], [place] with wife.   Social Determinants of Health   Financial Resource Strain: Not on file  Food Insecurity: Not on file  Transportation Needs: Not on file  Physical Activity: Not on file  Stress: Not on file  Social Connections: Not on file  Intimate Partner Violence: Not on file    Family History  Problem Relation Age of Onset   Heart disease Mother    Lung cancer Mother    Heart attack Mother 41   Stroke Brother    Healthy Daughter    Diabetes Father    Heart disease Father     ROS- All systems are reviewed and negative except as per the HPI above.  Physical Exam: Vitals:   11/16/21 1059  BP: 136/64  Pulse: (!) 52  Weight: 114.6 kg  Height: 5\' 10"  (1.778 m)    GEN- The patient is well appearing, alert and oriented x 3 today.   Head- normocephalic, atraumatic Eyes-  Sclera clear, conjunctiva pink Ears- hearing intact Oropharynx- clear Neck- supple  Lungs- Clear to ausculation bilaterally, normal work of breathing Heart- regular rate and rhythm  GI- soft, NT, ND, + BS Extremities- no clubbing, cyanosis, or  2+ edema  MS- no significant deformity or atrophy Skin- no rash or lesion Psych- euthymic mood, full affect Neuro- strength and sensation  are intact  Wt Readings from Last 3 Encounters:  11/16/21 114.6 kg  11/10/21 113.9 kg  09/27/21 110.7 kg    EKG today demonstrates Vent. rate 52 BPM PR interval 280 ms QRS duration 94 ms QT/QTcB 456/424 ms P-R-T axes 70 -59 65 Sinus bradycardia with 1st degree A-V block Left axis deviation Possible Inferior infarct , age undetermined Anteroseptal infarct , age undetermined Abnormal ECG When compared with ECG of 10-Nov-2021 13:16, PREVIOUS ECG IS PRESENT  Epic records are reviewed at length today  Assessment and Plan:  1. Persistent atrial fibrillation  S/p ablation second ablation in August 2022 and SR only lasted a few days He started amiodarone 11/8 to pursue cardioversion and restore SR He initially did not want to f/u with cardioversion but eventually consented He is now s/p successful cardioversion and is in sinus brday today  Continue Eliquis 5 mg bid  for CHADS2VASC of 5    bmet done today   2. HTN Stable   3. Obstructive sleep apnea The patient reports compliance with CPAP   4. Combined systolic /diastolic HF   Normovolemic  Continue with current lasix dose Avoid salt, daily weights  Felt he had extra fluid after cardioversion  I increased the lasix to 60 mg bid  for the last few days and his weights at home are better by 2 lbs Go back to usual lasix dose tomorrow  I instructed how to adjust the lasix at home based on his daily weights to stay ahead of extra fluid retention   F/u here  3 months with TSH, cmet for  amiodarone monitoring   Roderic Palau, NP 11/16/2021 11:34 AM

## 2021-11-21 ENCOUNTER — Ambulatory Visit (HOSPITAL_COMMUNITY): Payer: Medicare Other | Admitting: Nurse Practitioner

## 2021-11-23 NOTE — Progress Notes (Signed)
[facility] Note  12/01/2021    CHIEF COMPLAINT Patient presents for Retina Follow Up  HISTORY OF PRESENT ILLNESS: Lucas Dudley is a 81 y.o. male who presents to the clinic today for:  HPI     Retina Follow Up   Patient presents with  Diabetic Retinopathy.  In both eyes.  Severity is moderate.  Duration of 4 weeks.  Since onset it is stable.  I, the attending physician,  performed the HPI with the patient and updated documentation appropriately.        Comments   Patient states vision the same OU.      Last edited by Bernarda Caffey, MD on 12/01/2021  3:49 PM.     Referring physician: [name], MD [place],  [place] [id number]  HISTORICAL INFORMATION:   Selected notes from the MEDICAL RECORD NUMBER Referred by Dr. Kathlen Mody for DEE   CURRENT MEDICATIONS: Current Outpatient Medications (Ophthalmic Drugs)  Medication Sig   dorzolamide-timolol (COSOPT) 22.3-6.8 MG/ML ophthalmic solution Place 1 drop into both eyes 2 (two) times daily.   COMBIGAN 0.2-0.5 % ophthalmic solution Apply 1 drop to eye 2 (two) times daily. (Patient not taking: Reported on 12/01/2021)   No current facility-administered medications for this visit. (Ophthalmic Drugs)   Current Outpatient Medications (Other)  Medication Sig   albuterol (VENTOLIN HFA) 108 (90 Base) MCG/ACT inhaler Inhale 2 puffs into the lungs every 4 (four) hours as needed for wheezing or shortness of breath.   amiodarone (PACERONE) 200 MG tablet Take 1 tablet (200 mg total) by mouth daily.   amLODipine (NORVASC) 10 MG tablet Take 1 tablet (10 mg total) by mouth at bedtime.   carvedilol (COREG) 6.25 MG tablet Takes 1 tablet by mouth in the AM and 1 tablet by mouth in the PM   Cholecalciferol (VITAMIN D-3 PO) Take 1,000 mg by mouth daily.   diphenhydramine-acetaminophen (TYLENOL PM) 25-500 MG TABS tablet Take 2 tablets by mouth at bedtime.   Docusate Calcium (STOOL SOFTENER PO) Take 100 mg  by mouth every evening.   ELIQUIS 5 MG TABS tablet TAKE 1 TABLET BY MOUTH TWICE A DAY   furosemide (LASIX) 40 MG tablet TAKE 1 TABLET BY MOUTH TWICE A DAY   glimepiride (AMARYL) 2 MG tablet Take 2 mg by mouth 2 (two) times daily.    Insulin Glargine (BASAGLAR KWIKPEN) 100 UNIT/ML Inject 35 Units into the skin at bedtime. (Patient taking differently: Inject 40 Units into the skin at bedtime.)   lisinopril (ZESTRIL) 40 MG tablet Take 40 mg by mouth daily.   Melatonin 10 MG TABS Take 10 mg by mouth at bedtime.   metFORMIN (GLUCOPHAGE-XR) 500 MG 24 hr tablet Take 1,000 mg by mouth in the morning and at bedtime.   mineral oil liquid Take 15 mLs by mouth daily as needed for moderate constipation.   Multiple Vitamins-Minerals (MULTIVITAMIN WITH MINERALS) tablet Take 1 tablet by mouth daily. Centrum men 50+   Omega-3 1000 MG CAPS Take 1,000 mg by mouth daily.   potassium chloride SA (KLOR-CON M20) 20 MEQ tablet Take 1 tablet (20 mEq total) by mouth daily.   valACYclovir (VALTREX) 1000 MG tablet Take 1,000 mg by mouth at bedtime.   gemfibrozil (LOPID) 600 MG tablet Take 600 mg by mouth at bedtime.    No current facility-administered medications for this visit. (Other)   REVIEW OF SYSTEMS: ROS   Positive for: Gastrointestinal, Endocrine, Cardiovascular, Eyes, Respiratory Negative for:  Constitutional, Neurological, Skin, Genitourinary, Musculoskeletal, HENT, Psychiatric, Allergic/Imm, Heme/Lymph Last edited by Roselee Nova D, COT on 12/01/2021  1:47 PM.      ALLERGIES Allergies  Allergen Reactions   Azithromycin Nausea Only   Tape Rash and Other (See Comments)    Causes skin redness, Use paper tape only.   PAST MEDICAL HISTORY Past Medical History:  Diagnosis Date   Arthritis    Atrial fibrillation (HCC)    CAD (coronary artery disease)    a. Cath 03/17/15 showing 100% ostial D1, 50% prox LAD to mid LAD, 40% RPDA stenosis. Med rx. // Myoview 01/2020: EF 31 diffuse perfusion defect without  reversibility (suspect artifact); reviewed with Dr. Aggie Cosier study felt to be low risk   Cataract    Mixed form OD   Chronic diastolic CHF (congestive heart failure) (HCC)    Diabetic retinopathy ([place])    NPDR OU   Essential hypertension    Glaucoma    POAG OU   History of gout    Hyperlipidemia    Hypertensive retinopathy    OU   Kidney stones    Melanoma of neck (HCC)    NICM (nonischemic cardiomyopathy) ([place])    OSA on CPAP 2012   Prostate cancer ([place])    Type II diabetes mellitus ([place])    Past Surgical History:  Procedure Laterality Date   ABDOMINAL HERNIA REPAIR     w/mesh   ATRIAL FIBRILLATION ABLATION N/A 06/06/2021   Procedure: ATRIAL FIBRILLATION ABLATION;  Surgeon: Thompson Grayer, MD;  Location: [facility];  Service: Cardiovascular;  Laterality: N/A;   CARDIAC CATHETERIZATION N/A 03/17/2015   Procedure: Left Heart Cath and Coronary Angiography;  Surgeon: Lorretta Harp, MD;  Location: [facility];  Service: Cardiovascular;  Laterality: N/A;   CARDIOVERSION N/A 08/09/2021   Procedure: CARDIOVERSION;  Surgeon: [name], MD;  Location: [facility];  Service: Cardiovascular;  Laterality: N/A;   CARDIOVERSION N/A 11/10/2021   Procedure: CARDIOVERSION;  Surgeon: Berniece Salines, DO;  Location: [facility];  Service: Cardiovascular;  Laterality: N/A;   carotid doppler  09/17/2008   rigt and left ICAs 0-49%;mildly  abnormal   CATARACT EXTRACTION Left 2020   Dr. Kathlen Mody   COLONOSCOPY N/A 05/16/2017   Procedure: COLONOSCOPY;  Surgeon: Rogene Houston, MD;  [facility];  Service: Endoscopy;  Laterality: N/A;  [place]  05/25/2009   EF 50-55%,LA mildly dilated, LV function normal   ELECTROPHYSIOLOGIC STUDY N/A 04/05/2015   Procedure: Cardioversion;  Surgeon: Sanda Klein, MD;  Location: [facility];  Service: Cardiovascular;  Laterality: N/A;   ELECTROPHYSIOLOGIC STUDY N/A 09/06/2015   Procedure: Atrial  Fibrillation Ablation;  Surgeon: Thompson Grayer, MD;  Location: [facility];  Service: Cardiovascular;  Laterality: N/A;   ELECTROPHYSIOLOGIC STUDY N/A 07/12/2016   [facility] afib ablation by Dr Rayann Heman   EXCISIONAL HEMORRHOIDECTOMY     "inside and out"   EYE SURGERY Left 2020   Cat Sx - Dr. Kathlen Mody   FINE NEEDLE ASPIRATION Right    knee; "drew ~ 1 quart off"   [place] Right    "neck"   NM MYOCAR PERF WALL MOTION  02/21/2012   EF 61% ,EXERCISE 7 METS. exercise stopped due to wheezing and shortness of breathe   POLYPECTOMY  05/16/2017   Procedure: POLYPECTOMY;  Surgeon: Rogene Houston, MD;  [facility];  Service:  Endoscopy;;  colon   PROSTATECTOMY     SHOULDER OPEN ROTATOR CUFF REPAIR Right X 2   TEE WITHOUT CARDIOVERSION N/A 09/05/2015   Procedure: TRANSESOPHAGEAL ECHOCARDIOGRAM (TEE);  Surgeon: Sueanne Margarita, MD;  Location[facility];  Service: Cardiovascular;  Laterality: N/A;   FAMILY HISTORY Family History  Problem Relation Age of Onset   Heart disease Mother    Lung cancer Mother    Heart attack Mother 63   Stroke Brother    Healthy Daughter    Diabetes Father    Heart disease Father    SOCIAL HISTORY Social History   Tobacco Use   Smoking status: Former    Packs/day: 2.00    Years: 27.00    Pack years: 54.00    Types: Cigarettes    Quit date: 10/31/1983    Years since quitting: 38.1   Smokeless tobacco: Never   Tobacco comments:    Former smoker 09/27/21  Vaping Use   Vaping Use: Never used  Substance Use Topics   Alcohol use: No    Alcohol/week: 0.0 standard drinks    Comment: "used to drink; stopped ~ 2008"   Drug use: No       OPHTHALMIC EXAM: Base Eye Exam     Visual Acuity (Snellen - Linear)       Right Left   Dist cc 20/40 +1 20/80 -1   Dist ph cc 20/30 NI    Correction: Glasses         Tonometry (Tonopen, 1:57 PM)       Right Left   Pressure 19 15          Pupils       Dark Light Shape React APD   Right 2 1 Round Brisk None   Left 2 1 Round Brisk None         Visual Fields (Counting fingers)       Left Right    Full Full         Extraocular Movement       Right Left    Full, Ortho Full, Ortho         Neuro/Psych     Oriented x3: Yes   Mood/Affect: Normal         Dilation     Both eyes: 1.0% Mydriacyl, 2.5% Phenylephrine @ 1:57 PM           Slit Lamp and Fundus Exam     Slit Lamp Exam       Right Left   Lids/Lashes Dermato, mild MGD Dermato, mild MGD   Conjunctiva/Sclera Temporal pinguecula Temporal pinguecula   Cornea EBMD, mild haze, trace PEE, mild Debris in tear film, well healed cataract wound trace haze, trace PEE, well healed temporal cataract wounds, arcus   Anterior Chamber Deep and clear; narrow temporal angle Deep and clear   Iris Round and dilated, mild anterior bowing, No NVI Round and moderately dilated to 5.67mm   Lens PCIOL in good position, trace PCO PCIOL, 2+ PCO   Anterior Vitreous Synerisis Synerisis         Fundus Exam       Right Left   Disc Mild pallor, sharp, +cupping, thin inferior rim Mild pallor, sharp rim, +cupping, +PPA   C/D Ratio 0.7 0.6   Macula Flat, blunted foveal reflex, trace central cystic changes, RPE mottling and clumping, no heme Flat, blunted foveal reflex, clusters of fine MA nasal and superior to fovea--improved, +cystic changes -- persistent, focal  exudates superior to fovea - improving   Vessels attenuated, Tortuous Attenuated, mild av crossing changes, mild tortuousity   Periphery Attached, rare MA Attached. No heme.           Refraction     Wearing Rx       Sphere Cylinder Axis Add   Right -0.25 +2.00 158 +2.50   Left -1.00 +1.25 008 +2.50           IMAGING AND PROCEDURES  Imaging and Procedures for 12/01/2021  OCT, Retina - OU - Both Eyes       Right Eye Quality was good. Central Foveal Thickness: 322. Progression has worsened.  Findings include abnormal foveal contour, no SRF, vitreomacular adhesion , intraretinal fluid (Mild interval increase in IRF superior fovea and macula).   Left Eye Quality was borderline. Central Foveal Thickness: 315. Progression has been stable. Findings include abnormal foveal contour, intraretinal fluid, intraretinal hyper-reflective material (Persistent cystic changes - ?slightly increased; partial PVD).   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 CME - cystoid macular edema. PED - pigment epithelial detachment. IRF - intraretinal fluid. SRF - subretinal fluid. EZ - ellipsoid zone. ERM - epiretinal membrane. ORA - outer retinal atrophy. ORT - outer retinal tubulation. SRHM - subretinal hyper-reflective material. IRHM - intraretinal hyper-reflective material     Intravitreal Injection, Pharmacologic Agent - OS - Left Eye       Time Out 12/01/2021. 2:59 PM. Confirmed correct patient, procedure, site, and patient consented.   Anesthesia Topical anesthesia was used. Anesthetic medications included Lidocaine 2%, Proparacaine 0.5%.   Procedure Preparation included 5% betadine to ocular surface, eyelid speculum. A (32g) needle was used.   Injection: 2 mg aflibercept 2 MG/0.05ML   Route: Intravitreal, Site: Left Eye   NDC: A3590391, Lot: [id number], Expiration date: 09/14/2022, Waste: 0.05 mL   Post-op Post injection exam found visual acuity of at least counting fingers. The patient tolerated the procedure well. There were no complications. The patient received written and verbal post procedure care education. Post injection medications were not given.             ASSESSMENT/PLAN:   ICD-10-CM   1. Both eyes affected by  mild nonproliferative diabetic retinopathy with macular edema, associated with type 2 diabetes mellitus (HCC)  U76.5465 OCT, Retina - OU - Both Eyes    Intravitreal Injection, Pharmacologic Agent - OS - Left Eye    aflibercept (EYLEA) SOLN 2 mg    2. Essential hypertension  I10     3. Hypertensive retinopathy of both eyes  H35.033     4. Pseudophakia of both eyes  Z96.1     5. 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 History of herpes zoster of eye  Z86.19     7. Primary open angle glaucoma of both eyes, unspecified glaucoma stage  H40.1130      1. 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 Single focal MA superior to fovea.                                  OS: Perifoveal Mas w/late leakage -  OCT shows diabetic macular edema OU (OS>OD) - s/p IVA OS # 1(07.26.21), #2 (08.24.21), #3 (09.21.21), #4 (10.22.21) -- IVA resistance - s/p IVE OS #1 (11.22.21), #2 (12.21.21), #3 (01.20.22), #4 (02.18.22), #5 (03.18.22), #6 (04.15.22), #7 (05.10.22) -- sample, #8 (06.08.22), #9 (07.12.22), #10 (08.12.22), #11 (09.16.22), #12 (10.21.22), #13 (11.18.22), #14 (12.21.22), #15 (01.20.23) - BCVA 20/30 OD; OS decreased to 20/80 from 20/70  - OCT shows OD: Mild interval increase in IRF superior fovea and macula; OS: Persistent cystic changes - ?slightly increased; partial PVD - recommend IVE OS #16 today, 02.17.23 - pt wishes to proceed with injection - RBA of procedure discussed, questions answered - IVA informed consent obtained and signed, 07.26.21 (OS) - IVE informed consent obtained and signed, 11.22.21 (OS) - ssee procedure note - [employer] approved Eylea until 10/14/2021. - [employer] approved Eylea until 09/04/2021 - f/u in 4 wks -- DFE/OCT, possible injection  2,3. Hypertensive retinopathy OU - discussed importance of tight BP control - monitor   4. Pseudophakia OU  - s/p CE/IOL OS (2020, Dr. Kathlen Mody)             - s/p CE/IOL  OD (2022, Dr. Kathlen Mody)  - IOLs in good position, doing well  - monitor   5. 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 Susa Simmonds in August 2022  - s/p SuperK OS on September 25, 2021  6. History of herpes zoster/iritis OS  - on valtrex 1g daily---maintenance    7. POAG OU  - was under the expert management of Dr. Kathlen Mody  - IOP 19,15 today  - currently on cosopt BID OU  Ophthalmic Meds Ordered this visit:  Meds ordered this encounter  Medications   aflibercept (EYLEA) SOLN 2 mg     Return in about 4 weeks (around 12/29/2021) for f/u exu ARMD OU, DFE, OCT.  There are no Patient Instructions on file for this visit.  This document serves as a record of services personally performed by Gardiner Sleeper, MD, PhD. It was created on their behalf by Leonie Douglas, an ophthalmic technician. The creation of this record is the provider's dictation and/or activities during the visit.    Electronically signed by: Leonie Douglas COA, 12/01/21  3:51 PM  This document serves as a record of services personally performed by Gardiner Sleeper, MD, PhD. It was created on their behalf by San Jetty. Owens Shark, OA an ophthalmic technician. The creation of this record is the provider's dictation and/or activities during the visit.    Electronically signed by: San Jetty. Owens Shark, [place] 02.17.2023 3:51 PM  Gardiner Sleeper, M.D., Ph.D. Diseases & Surgery of the Retina and Vitreous [facility]  I have reviewed the above documentation for accuracy and completeness, and I agree with the above. Gardiner Sleeper, M.D., Ph.D. 12/01/21 3:52 PM   Abbreviations: M myopia (nearsighted); A astigmatism; H hyperopia (farsighted); P presbyopia; Mrx spectacle prescription;  CTL contact lenses; OD right eye; OS left eye; OU both eyes  XT exotropia; ET esotropia; PEK punctate epithelial keratitis; PEE punctate epithelial erosions; DES dry eye syndrome; MGD meibomian gland dysfunction; ATs artificial tears; PFAT's  preservative free artificial tears; [place] nuclear sclerotic cataract; PSC posterior subcapsular cataract; ERM epi-retinal membrane; PVD posterior vitreous detachment; RD retinal detachment; DM diabetes mellitus; DR diabetic retinopathy; NPDR non-proliferative diabetic retinopathy; PDR proliferative diabetic retinopathy; CSME clinically significant macular edema; DME diabetic macular edema; dbh dot blot hemorrhages; CWS cotton wool spot; POAG  primary open angle glaucoma; C/D cup-to-disc ratio; HVF humphrey visual field; GVF goldmann visual field; OCT optical coherence tomography; IOP intraocular pressure; BRVO Branch retinal vein occlusion; CRVO central retinal vein occlusion; CRAO central retinal artery occlusion; BRAO branch retinal artery occlusion; RT retinal tear; SB scleral buckle; PPV pars plana vitrectomy; VH Vitreous hemorrhage; PRP panretinal laser photocoagulation; IVK intravitreal kenalog; VMT vitreomacular traction; MH Macular hole;  NVD neovascularization of the disc; NVE neovascularization elsewhere; AREDS age related eye disease study; ARMD age related macular degeneration; POAG primary open angle glaucoma; EBMD epithelial/anterior basement membrane dystrophy; ACIOL anterior chamber intraocular lens; IOL intraocular lens; PCIOL posterior chamber intraocular lens; Phaco/IOL phacoemulsification with intraocular lens placement; [place] photorefractive keratectomy; LASIK laser assisted in situ keratomileusis; HTN hypertension; DM diabetes mellitus; COPD chronic obstructive pulmonary disease

## 2021-12-01 ENCOUNTER — Ambulatory Visit (INDEPENDENT_AMBULATORY_CARE_PROVIDER_SITE_OTHER): Payer: Medicare Other | Admitting: Ophthalmology

## 2021-12-01 ENCOUNTER — Encounter (INDEPENDENT_AMBULATORY_CARE_PROVIDER_SITE_OTHER): Payer: Self-pay | Admitting: Ophthalmology

## 2021-12-01 ENCOUNTER — Other Ambulatory Visit: Payer: Self-pay

## 2021-12-01 DIAGNOSIS — E113213 Type 2 diabetes mellitus with mild nonproliferative diabetic retinopathy with macular edema, bilateral: Secondary | ICD-10-CM | POA: Diagnosis not present

## 2021-12-01 DIAGNOSIS — I10 Essential (primary) hypertension: Secondary | ICD-10-CM

## 2021-12-01 DIAGNOSIS — Z961 Presence of intraocular lens: Secondary | ICD-10-CM | POA: Diagnosis not present

## 2021-12-01 DIAGNOSIS — H35033 Hypertensive retinopathy, bilateral: Secondary | ICD-10-CM | POA: Diagnosis not present

## 2021-12-01 DIAGNOSIS — H18523 Epithelial (juvenile) corneal dystrophy, bilateral: Secondary | ICD-10-CM

## 2021-12-01 DIAGNOSIS — Z8619 Personal history of other infectious and parasitic diseases: Secondary | ICD-10-CM

## 2021-12-01 DIAGNOSIS — H401130 Primary open-angle glaucoma, bilateral, stage unspecified: Secondary | ICD-10-CM

## 2021-12-01 MED ORDER — AFLIBERCEPT 2MG/0.05ML IZ SOLN FOR KALEIDOSCOPE
2.0000 mg | INTRAVITREAL | Status: AC | PRN
  Administered 2021-12-01: 2 mg via INTRAVITREAL

## 2021-12-21 ENCOUNTER — Other Ambulatory Visit (HOSPITAL_COMMUNITY): Payer: Self-pay | Admitting: Nurse Practitioner

## 2022-01-02 NOTE — Progress Notes (Signed)
?[facility] Note ? ?01/05/2022 ?  ? ?CHIEF COMPLAINT ?Patient presents for Retina Follow Up ? ?HISTORY OF PRESENT ILLNESS: ?Lucas Dudley is a 81 y.o. male who presents to the clinic today for:  ?HPI   ? ? Retina Follow Up   ?Patient presents with  Diabetic Retinopathy.  In both eyes.  This started 5 weeks ago.  I, the attending physician,  performed the HPI with the patient and updated documentation appropriately. ? ?  ?  ? ? Comments   ?Patient here for 5 weeks retina follow up for NPDR OU. Patient states vision about the same. OS really blurry. No eye pain. Had a stabbing pain the other day. Went away in about 10 minutes.  ? ?  ?  ?Last edited by Bernarda Caffey, MD on 01/06/2022  1:28 AM.  ?  ? ? ?Referring physician: ?[name], MD ?[address] ?[place],  [place] [address] ? ?HISTORICAL INFORMATION:  ? ?Selected notes from the [facility] ?Referred by Dr. Kathlen Mody for [place]  ? ?CURRENT MEDICATIONS: ?Current Outpatient Medications (Ophthalmic Drugs)  ?Medication Sig  ? dorzolamide-timolol (COSOPT) 22.3-6.8 MG/ML ophthalmic solution Place 1 drop into both eyes 2 (two) times daily.  ? COMBIGAN 0.2-0.5 % ophthalmic solution Apply 1 drop to eye 2 (two) times daily. (Patient not taking: Reported on 12/01/2021)  ? ?No current facility-administered medications for this visit. (Ophthalmic Drugs)  ? ?Current Outpatient Medications (Other)  ?Medication Sig  ? albuterol (VENTOLIN HFA) 108 (90 Base) MCG/ACT inhaler Inhale 2 puffs into the lungs every 4 (four) hours as needed for wheezing or shortness of breath.  ? amiodarone (PACERONE) 200 MG tablet TAKE 1 TABLET BY MOUTH EVERY DAY  ? amLODipine (NORVASC) 10 MG tablet Take 1 tablet (10 mg total) by mouth at bedtime.  ? carvedilol (COREG) 6.25 MG tablet Takes 1 tablet by mouth in the AM and 1 tablet by mouth in the PM  ? Cholecalciferol (VITAMIN D-3 PO) Take 1,000 mg by mouth daily.  ? diphenhydramine-acetaminophen (TYLENOL PM) 25-500 MG  TABS tablet Take 2 tablets by mouth at bedtime.  ? Docusate Calcium (STOOL SOFTENER PO) Take 100 mg by mouth every evening.  ? ELIQUIS 5 MG TABS tablet TAKE 1 TABLET BY MOUTH TWICE A DAY  ? furosemide (LASIX) 40 MG tablet TAKE 1 TABLET BY MOUTH TWICE A DAY  ? gemfibrozil (LOPID) 600 MG tablet Take 600 mg by mouth at bedtime.   ? glimepiride (AMARYL) 2 MG tablet Take 2 mg by mouth 2 (two) times daily.   ? Insulin Glargine (BASAGLAR KWIKPEN) 100 UNIT/ML Inject 35 Units into the skin at bedtime. (Patient taking differently: Inject 40 Units into the skin at bedtime.)  ? KLOR-CON M20 20 MEQ tablet TAKE 1 TABLET BY MOUTH EVERY DAY  ? lisinopril (ZESTRIL) 40 MG tablet Take 40 mg by mouth daily.  ? Melatonin 10 MG TABS Take 10 mg by mouth at bedtime.  ? metFORMIN (GLUCOPHAGE-XR) 500 MG 24 hr tablet Take 1,000 mg by mouth in the morning and at bedtime.  ? mineral oil liquid Take 15 mLs by mouth daily as needed for moderate constipation.  ? Multiple Vitamins-Minerals (MULTIVITAMIN WITH MINERALS) tablet Take 1 tablet by mouth daily. Centrum men 50+  ? Omega-3 1000 MG CAPS Take 1,000 mg by mouth daily.  ? valACYclovir (VALTREX) 1000 MG tablet Take 1,000 mg by mouth at bedtime.  ? ?No current facility-administered medications for this visit. (Other)  ? ?  REVIEW OF SYSTEMS: ?ROS   ?Positive for: Gastrointestinal, Endocrine, Cardiovascular, Eyes, Respiratory ?Negative for: Constitutional, Neurological, Skin, Genitourinary, Musculoskeletal, HENT, Psychiatric, Allergic/Imm, Heme/Lymph ?Last edited by Theodore Demark, COA on 01/05/2022  1:54 PM.  ?  ? ?ALLERGIES ?Allergies  ?Allergen Reactions  ? Azithromycin Nausea Only  ? Tape Rash and Other (See Comments)  ?  Causes skin redness, Use paper tape only.  ? ?PAST MEDICAL HISTORY ?Past Medical History:  ?Diagnosis Date  ? Arthritis   ? Atrial fibrillation ([place])   ? CAD (coronary artery disease)   ? a. Cath 03/17/15 showing 100% ostial D1, 50% prox LAD to mid LAD, 40% RPDA stenosis. Med  rx. // Myoview 01/2020: EF 31 diffuse perfusion defect without reversibility (suspect artifact); reviewed with Dr. Aggie Cosier study felt to be low risk  ? Cataract   ? Mixed form OD  ? Chronic diastolic CHF (congestive heart failure) ([place])   ? Diabetic retinopathy ([place])   ? NPDR OU  ? Essential hypertension   ? Glaucoma   ? POAG OU  ? History of gout   ? Hyperlipidemia   ? Hypertensive retinopathy   ? OU  ? Kidney stones   ? Melanoma of neck ([place])   ? NICM (nonischemic cardiomyopathy) ([place])   ? OSA on CPAP 2012  ? Prostate cancer ([place])   ? Type II diabetes mellitus ([place])   ? ?Past Surgical History:  ?Procedure Laterality Date  ? ABDOMINAL HERNIA REPAIR    ? w/mesh  ? ATRIAL FIBRILLATION ABLATION N/A 06/06/2021  ? Procedure: ATRIAL FIBRILLATION ABLATION;  Surgeon: Thompson Grayer, MD;  Location: [facility];  Service: Cardiovascular;  Laterality: N/A;  ? CARDIAC CATHETERIZATION N/A 03/17/2015  ? Procedure: Left Heart Cath and Coronary Angiography;  Surgeon: Lorretta Harp, MD;  Location: [facility];  Service: Cardiovascular;  Laterality: N/A;  ? CARDIOVERSION N/A 08/09/2021  ? Procedure: CARDIOVERSION;  Surgeon: [name], MD;  Location: [facility];  Service: Cardiovascular;  Laterality: N/A;  ? CARDIOVERSION N/A 11/10/2021  ? Procedure: CARDIOVERSION;  Surgeon: Berniece Salines, DO;  [facility];  Service: Cardiovascular;  Laterality: N/A;  ? carotid doppler  09/17/2008  ? rigt and left ICAs 0-49%;mildly  abnormal  ? CATARACT EXTRACTION Left 2020  ? Dr. Kathlen Mody  ? COLONOSCOPY N/A 05/16/2017  ? Procedure: COLONOSCOPY;  Surgeon: Rogene Houston, MD;  [facility];  Service: Endoscopy;  Laterality: N/A;  930  ? DOPPLER ECHOCARDIOGRAPHY  05/25/2009  ? EF 50-55%,LA mildly dilated, LV function normal  ? ELECTROPHYSIOLOGIC STUDY N/A 04/05/2015  ? Procedure: Cardioversion;  Surgeon: Sanda Klein, MD;  Location: [facility];  Service: Cardiovascular;  Laterality: N/A;  ?  ELECTROPHYSIOLOGIC STUDY N/A 09/06/2015  ? Procedure: Atrial Fibrillation Ablation;  Surgeon: Thompson Grayer, MD;  Location: [facility];  Service: Cardiovascular;  Laterality: N/A;  ? ELECTROPHYSIOLOGIC STUDY N/A 07/12/2016  ? redo afib ablation by Dr Rayann Heman  ? EXCISIONAL HEMORRHOIDECTOMY    ? "inside and out"  ? EYE SURGERY Left 2020  ? Cat Sx - Dr. Kathlen Mody  ? FINE NEEDLE ASPIRATION Right   ? knee; "drew ~ 1 quart off"  ? HERNIA REPAIR    ? LAPAROSCOPIC CHOLECYSTECTOMY    ? MELANOMA EXCISION Right   ? "neck"  ? NM MYOCAR PERF WALL MOTION  02/21/2012  ? EF 61% ,EXERCISE 7 METS. exercise stopped due to wheezing and shortness of breathe  ? POLYPECTOMY  05/16/2017  ? Procedure:  POLYPECTOMY;  Surgeon: Rogene Houston, MD;  [facility];  Service: Endoscopy;;  colon  ? PROSTATECTOMY    ? SHOULDER OPEN ROTATOR CUFF REPAIR Right X 2  ? TEE WITHOUT CARDIOVERSION N/A 09/05/2015  ? Procedure: TRANSESOPHAGEAL ECHOCARDIOGRAM (TEE);  Surgeon: Sueanne Margarita, MD;  Location: [facility];  Service: Cardiovascular;  Laterality: N/A;  ? ?FAMILY HISTORY ?Family History  ?Problem Relation Age of Onset  ? Heart disease Mother   ? Lung cancer Mother   ? Heart attack Mother 60  ? Stroke Brother   ? Healthy Daughter   ? Diabetes Father   ? Heart disease Father   ? ?SOCIAL HISTORY ?Social History  ? ?Tobacco Use  ? Smoking status: Former  ?  Packs/day: 2.00  ?  Years: 27.00  ?  Pack years: 54.00  ?  Types: Cigarettes  ?  Quit date: 10/31/1983  ?  Years since quitting: 38.2  ? Smokeless tobacco: Never  ? Tobacco comments:  ?  Former smoker 09/27/21  ?Vaping Use  ? Vaping Use: Never used  ?Substance Use Topics  ? Alcohol use: No  ?  Alcohol/week: 0.0 standard drinks  ?  Comment: "used to drink; stopped ~ 2008"  ? Drug use: No  ?  ? ?  ?OPHTHALMIC EXAM: ?Base Eye Exam   ? ? Visual Acuity (Snellen - Linear)   ? ?   Right Left  ? Dist cc 20/40 -2 20/100  ? Dist ph cc 20/30 -1 NI  ? ? Correction: Glasses  ? ?  ?  ? ? Tonometry  (Tonopen, 1:52 PM)   ? ?   Right Left  ? Pressure 10 11  ? ?  ?  ? ? Pupils   ? ?   Dark Light Shape React APD  ? Right 2 1 Round Brisk None  ? Left 2 1 Round Brisk None  ? ?  ?  ? ? Visual Fields (Counting finge

## 2022-01-05 ENCOUNTER — Ambulatory Visit (INDEPENDENT_AMBULATORY_CARE_PROVIDER_SITE_OTHER): Payer: Medicare Other | Admitting: Ophthalmology

## 2022-01-05 ENCOUNTER — Encounter (INDEPENDENT_AMBULATORY_CARE_PROVIDER_SITE_OTHER): Payer: Self-pay | Admitting: Ophthalmology

## 2022-01-05 ENCOUNTER — Other Ambulatory Visit: Payer: Self-pay

## 2022-01-05 DIAGNOSIS — H35033 Hypertensive retinopathy, bilateral: Secondary | ICD-10-CM

## 2022-01-05 DIAGNOSIS — Z961 Presence of intraocular lens: Secondary | ICD-10-CM | POA: Diagnosis not present

## 2022-01-05 DIAGNOSIS — H26492 Other secondary cataract, left eye: Secondary | ICD-10-CM

## 2022-01-05 DIAGNOSIS — I10 Essential (primary) hypertension: Secondary | ICD-10-CM | POA: Diagnosis not present

## 2022-01-05 DIAGNOSIS — E113213 Type 2 diabetes mellitus with mild nonproliferative diabetic retinopathy with macular edema, bilateral: Secondary | ICD-10-CM

## 2022-01-05 DIAGNOSIS — H401130 Primary open-angle glaucoma, bilateral, stage unspecified: Secondary | ICD-10-CM

## 2022-01-05 DIAGNOSIS — Z8619 Personal history of other infectious and parasitic diseases: Secondary | ICD-10-CM

## 2022-01-05 DIAGNOSIS — H18523 Epithelial (juvenile) corneal dystrophy, bilateral: Secondary | ICD-10-CM

## 2022-01-06 ENCOUNTER — Encounter (INDEPENDENT_AMBULATORY_CARE_PROVIDER_SITE_OTHER): Payer: Self-pay | Admitting: Ophthalmology

## 2022-01-06 DIAGNOSIS — E113213 Type 2 diabetes mellitus with mild nonproliferative diabetic retinopathy with macular edema, bilateral: Secondary | ICD-10-CM | POA: Diagnosis not present

## 2022-01-06 MED ORDER — AFLIBERCEPT 2MG/0.05ML IZ SOLN FOR KALEIDOSCOPE
2.0000 mg | INTRAVITREAL | Status: AC | PRN
  Administered 2022-01-06: 2 mg via INTRAVITREAL

## 2022-01-10 NOTE — Progress Notes (Signed)
?[facility] Note ? ?01/16/2022 ?  ? ?CHIEF COMPLAINT ?Patient presents for Retina Follow Up ? ?HISTORY OF PRESENT ILLNESS: ?Lucas Dudley is a 81 y.o. male who presents to the clinic today for:  ?HPI   ? ? Retina Follow Up   ?Patient presents with  Diabetic Retinopathy.  In both eyes.  This started 1.5 weeks ago.  I, the attending physician,  performed the HPI with the patient and updated documentation appropriately. ? ?  ?  ? ? Comments   ?Patient here for 1.5 weeks retina follow up for NPDR OU / Yag OS. Patient states vision about the same. No eye pain.  ? ?  ?  ?Last edited by Bernarda Caffey, MD on 01/16/2022  3:48 PM.  ?  ?Pt here for yag os ? ? ?Referring physician: ?[name], MD ?[address] ?[place],  [place] [address] ? ?HISTORICAL INFORMATION:  ? ?Selected notes from the [facility] ?Referred by Dr. Kathlen Mody for [place]  ? ?CURRENT MEDICATIONS: ?Current Outpatient Medications (Ophthalmic Drugs)  ?Medication Sig  ? dorzolamide-timolol (COSOPT) 22.3-6.8 MG/ML ophthalmic solution Place 1 drop into both eyes 2 (two) times daily.  ? COMBIGAN 0.2-0.5 % ophthalmic solution Apply 1 drop to eye 2 (two) times daily. (Patient not taking: Reported on 12/01/2021)  ? ?No current facility-administered medications for this visit. (Ophthalmic Drugs)  ? ?Current Outpatient Medications (Other)  ?Medication Sig  ? albuterol (VENTOLIN HFA) 108 (90 Base) MCG/ACT inhaler Inhale 2 puffs into the lungs every 4 (four) hours as needed for wheezing or shortness of breath.  ? amiodarone (PACERONE) 200 MG tablet TAKE 1 TABLET BY MOUTH EVERY DAY  ? amLODipine (NORVASC) 10 MG tablet Take 1 tablet (10 mg total) by mouth at bedtime.  ? carvedilol (COREG) 6.25 MG tablet Takes 1 tablet by mouth in the AM and 1 tablet by mouth in the PM  ? Cholecalciferol (VITAMIN D-3 PO) Take 1,000 mg by mouth daily.  ? diphenhydramine-acetaminophen (TYLENOL PM) 25-500 MG TABS tablet Take 2 tablets by mouth at bedtime.  ?  Docusate Calcium (STOOL SOFTENER PO) Take 100 mg by mouth every evening.  ? ELIQUIS 5 MG TABS tablet TAKE 1 TABLET BY MOUTH TWICE A DAY  ? furosemide (LASIX) 40 MG tablet TAKE 1 TABLET BY MOUTH TWICE A DAY  ? gemfibrozil (LOPID) 600 MG tablet Take 600 mg by mouth at bedtime.   ? glimepiride (AMARYL) 2 MG tablet Take 2 mg by mouth 2 (two) times daily.   ? Insulin Glargine (BASAGLAR KWIKPEN) 100 UNIT/ML Inject 35 Units into the skin at bedtime. (Patient taking differently: Inject 40 Units into the skin at bedtime.)  ? KLOR-CON M20 20 MEQ tablet TAKE 1 TABLET BY MOUTH EVERY DAY  ? lisinopril (ZESTRIL) 40 MG tablet Take 40 mg by mouth daily.  ? Melatonin 10 MG TABS Take 10 mg by mouth at bedtime.  ? metFORMIN (GLUCOPHAGE-XR) 500 MG 24 hr tablet Take 1,000 mg by mouth in the morning and at bedtime.  ? mineral oil liquid Take 15 mLs by mouth daily as needed for moderate constipation.  ? Multiple Vitamins-Minerals (MULTIVITAMIN WITH MINERALS) tablet Take 1 tablet by mouth daily. Centrum men 50+  ? Omega-3 1000 MG CAPS Take 1,000 mg by mouth daily.  ? valACYclovir (VALTREX) 1000 MG tablet Take 1,000 mg by mouth at bedtime.  ? ?No current facility-administered medications for this visit. (Other)  ? ?REVIEW OF SYSTEMS: ?ROS   ?Positive  for: Gastrointestinal, Endocrine, Cardiovascular, Eyes, Respiratory ?Negative for: Constitutional, Neurological, Skin, Genitourinary, Musculoskeletal, HENT, Psychiatric, Allergic/Imm, Heme/Lymph ?Last edited by Theodore Demark, COA on 01/16/2022  2:27 PM.  ?  ? ?ALLERGIES ?Allergies  ?Allergen Reactions  ? Azithromycin Nausea Only  ? Tape Rash and Other (See Comments)  ?  Causes skin redness, Use paper tape only.  ? ?PAST MEDICAL HISTORY ?Past Medical History:  ?Diagnosis Date  ? Arthritis   ? Atrial fibrillation ([place])   ? CAD (coronary artery disease)   ? a. Cath 03/17/15 showing 100% ostial D1, 50% prox LAD to mid LAD, 40% RPDA stenosis. Med rx. // Myoview 01/2020: EF 31 diffuse perfusion defect  without reversibility (suspect artifact); reviewed with Dr. Aggie Cosier study felt to be low risk  ? Cataract   ? Mixed form OD  ? Chronic diastolic CHF (congestive heart failure) ([place])   ? Diabetic retinopathy ([place])   ? NPDR OU  ? Essential hypertension   ? Glaucoma   ? POAG OU  ? History of gout   ? Hyperlipidemia   ? Hypertensive retinopathy   ? OU  ? Kidney stones   ? Melanoma of neck ([place])   ? NICM (nonischemic cardiomyopathy) ([place])   ? OSA on CPAP 2012  ? Prostate cancer ([place])   ? Type II diabetes mellitus ([place])   ? ?Past Surgical History:  ?Procedure Laterality Date  ? ABDOMINAL HERNIA REPAIR    ? w/mesh  ? ATRIAL FIBRILLATION ABLATION N/A 06/06/2021  ? Procedure: ATRIAL FIBRILLATION ABLATION;  Surgeon: Thompson Grayer, MD;  Location: [facility];  Service: Cardiovascular;  Laterality: N/A;  ? CARDIAC CATHETERIZATION N/A 03/17/2015  ? Procedure: Left Heart Cath and Coronary Angiography;  Surgeon: Lorretta Harp, MD;  Location: [facility];  Service: Cardiovascular;  Laterality: N/A;  ? CARDIOVERSION N/A 08/09/2021  ? Procedure: CARDIOVERSION;  Surgeon: [name], MD;  Location: [facility];  Service: Cardiovascular;  Laterality: N/A;  ? CARDIOVERSION N/A 11/10/2021  ? Procedure: CARDIOVERSION;  Surgeon: Berniece Salines, DO;  [facility];  Service: Cardiovascular;  Laterality: N/A;  ? carotid doppler  09/17/2008  ? rigt and left ICAs 0-49%;mildly  abnormal  ? CATARACT EXTRACTION Left 2020  ? Dr. Kathlen Mody  ? COLONOSCOPY N/A 05/16/2017  ? Procedure: COLONOSCOPY;  Surgeon: Rogene Houston, MD;  [facility];  Service: Endoscopy;  Laterality: N/A;  930  ? DOPPLER ECHOCARDIOGRAPHY  05/25/2009  ? EF 50-55%,LA mildly dilated, LV function normal  ? ELECTROPHYSIOLOGIC STUDY N/A 04/05/2015  ? Procedure: Cardioversion;  Surgeon: Sanda Klein, MD;  Location: [facility];  Service: Cardiovascular;  Laterality: N/A;  ? ELECTROPHYSIOLOGIC STUDY N/A 09/06/2015  ? Procedure: Atrial  Fibrillation Ablation;  Surgeon: Thompson Grayer, MD;  Location: [facility];  Service: Cardiovascular;  Laterality: N/A;  ? ELECTROPHYSIOLOGIC STUDY N/A 07/12/2016  ? redo afib ablation by Dr Rayann Heman  ? EXCISIONAL HEMORRHOIDECTOMY    ? "inside and out"  ? EYE SURGERY Left 2020  ? Cat Sx - Dr. Kathlen Mody  ? FINE NEEDLE ASPIRATION Right   ? knee; "drew ~ 1 quart off"  ? HERNIA REPAIR    ? LAPAROSCOPIC CHOLECYSTECTOMY    ? MELANOMA EXCISION Right   ? "neck"  ? NM MYOCAR PERF WALL MOTION  02/21/2012  ? EF 61% ,EXERCISE 7 METS. exercise stopped due to wheezing and shortness of breathe  ? POLYPECTOMY  05/16/2017  ? Procedure: POLYPECTOMY;  Surgeon: Rogene Houston, MD;  [facility];  Service: Endoscopy;;  colon  ? PROSTATECTOMY    ? SHOULDER OPEN ROTATOR CUFF REPAIR Right X 2  ? TEE WITHOUT CARDIOVERSION N/A 09/05/2015  ? Procedure: TRANSESOPHAGEAL ECHOCARDIOGRAM (TEE);  Surgeon: Sueanne Margarita, MD;  Location: [facility];  Service: Cardiovascular;  Laterality: N/A;  ? ?FAMILY HISTORY ?Family History  ?Problem Relation Age of Onset  ? Heart disease Mother   ? Lung cancer Mother   ? Heart attack Mother 65  ? Stroke Brother   ? Healthy Daughter   ? Diabetes Father   ? Heart disease Father   ? ?SOCIAL HISTORY ?Social History  ? ?Tobacco Use  ? Smoking status: Former  ?  Packs/day: 2.00  ?  Years: 27.00  ?  Pack years: 54.00  ?  Types: Cigarettes  ?  Quit date: 10/31/1983  ?  Years since quitting: 38.2  ? Smokeless tobacco: Never  ? Tobacco comments:  ?  Former smoker 09/27/21  ?Vaping Use  ? Vaping Use: Never used  ?Substance Use Topics  ? Alcohol use: No  ?  Alcohol/week: 0.0 standard drinks  ?  Comment: "used to drink; stopped ~ 2008"  ? Drug use: No  ?  ? ?  ?OPHTHALMIC EXAM: ?Base Eye Exam   ? ? Visual Acuity (Snellen - Linear)   ? ?   Right Left  ? Dist cc 20/40 -1 20/150 -2  ? Dist ph cc 20/25 NI  ? ? Correction: Glasses  ? ?  ?  ? ? Tonometry (Tonopen, 2:24 PM)   ? ?   Right Left  ? Pressure 13 14  ? ?  ?   ? ? Pupils   ? ?   Dark Light Shape React APD  ? Right 2 1 Round Brisk None  ? Left 2 1 Round Brisk None  ? ?  ?  ? ? Visual Fields (Counting fingers)   ? ?   Left Right  ?  Full Full  ? ?  ?  ? ? Extr

## 2022-01-16 ENCOUNTER — Encounter (INDEPENDENT_AMBULATORY_CARE_PROVIDER_SITE_OTHER): Payer: Self-pay | Admitting: Ophthalmology

## 2022-01-16 ENCOUNTER — Ambulatory Visit (INDEPENDENT_AMBULATORY_CARE_PROVIDER_SITE_OTHER): Payer: Medicare Other | Admitting: Ophthalmology

## 2022-01-16 DIAGNOSIS — I10 Essential (primary) hypertension: Secondary | ICD-10-CM

## 2022-01-16 DIAGNOSIS — H26492 Other secondary cataract, left eye: Secondary | ICD-10-CM | POA: Diagnosis not present

## 2022-01-16 DIAGNOSIS — H35033 Hypertensive retinopathy, bilateral: Secondary | ICD-10-CM

## 2022-01-16 DIAGNOSIS — E113213 Type 2 diabetes mellitus with mild nonproliferative diabetic retinopathy with macular edema, bilateral: Secondary | ICD-10-CM | POA: Diagnosis not present

## 2022-01-16 DIAGNOSIS — H18523 Epithelial (juvenile) corneal dystrophy, bilateral: Secondary | ICD-10-CM

## 2022-01-16 DIAGNOSIS — Z961 Presence of intraocular lens: Secondary | ICD-10-CM

## 2022-01-16 DIAGNOSIS — H401130 Primary open-angle glaucoma, bilateral, stage unspecified: Secondary | ICD-10-CM

## 2022-01-16 DIAGNOSIS — Z8619 Personal history of other infectious and parasitic diseases: Secondary | ICD-10-CM

## 2022-01-31 NOTE — Progress Notes (Signed)
?[facility] Note ? ?02/02/2022 ?  ? ?CHIEF COMPLAINT ?Patient presents for Retina Follow Up ? ?HISTORY OF PRESENT ILLNESS: ?Lucas Dudley is a 81 y.o. male who presents to the clinic today for:  ?HPI   ? ? Retina Follow Up   ?Patient presents with  Diabetic Retinopathy.  In both eyes.  This started 4 weeks ago.  I, the attending physician,  performed the HPI with the patient and updated documentation appropriately. ? ?  ?  ? ? Comments   ?Patient here for 4 weeks retina follow up for NPDR OU. Patient states vision is a little better since done laser. No eye pain. ? ?  ?  ?Last edited by Bernarda Caffey, MD on 02/02/2022 11:44 PM.  ?  ? ?Pt states Yag helped his left eye vision, no problems after the laser, no new or worsening fol or floaters ? ? ?Referring physician: ?[name], MD ?[address] ?[place],  [place] [address] ? ?HISTORICAL INFORMATION:  ? ?Selected notes from the [place] ?Referred by Dr. Kathlen Mody for [place]  ? ?CURRENT MEDICATIONS: ?Current Outpatient Medications (Ophthalmic Drugs)  ?Medication Sig  ? dorzolamide-timolol (COSOPT) 22.3-6.8 MG/ML ophthalmic solution Place 1 drop into both eyes 2 (two) times daily.  ? COMBIGAN 0.2-0.5 % ophthalmic solution Apply 1 drop to eye 2 (two) times daily. (Patient not taking: Reported on 12/01/2021)  ? ?No current facility-administered medications for this visit. (Ophthalmic Drugs)  ? ?Current Outpatient Medications (Other)  ?Medication Sig  ? albuterol (VENTOLIN HFA) 108 (90 Base) MCG/ACT inhaler Inhale 2 puffs into the lungs every 4 (four) hours as needed for wheezing or shortness of breath.  ? amiodarone (PACERONE) 200 MG tablet TAKE 1 TABLET BY MOUTH EVERY DAY  ? amLODipine (NORVASC) 10 MG tablet Take 1 tablet (10 mg total) by mouth at bedtime.  ? carvedilol (COREG) 6.25 MG tablet Takes 1 tablet by mouth in the AM and 1 tablet by mouth in the PM  ? Cholecalciferol (VITAMIN D-3 PO) Take 1,000 mg by mouth daily.  ?  diphenhydramine-acetaminophen (TYLENOL PM) 25-500 MG TABS tablet Take 2 tablets by mouth at bedtime.  ? Docusate Calcium (STOOL SOFTENER PO) Take 100 mg by mouth every evening.  ? ELIQUIS 5 MG TABS tablet TAKE 1 TABLET BY MOUTH TWICE A DAY  ? furosemide (LASIX) 40 MG tablet TAKE 1 TABLET BY MOUTH TWICE A DAY  ? gemfibrozil (LOPID) 600 MG tablet Take 600 mg by mouth at bedtime.   ? glimepiride (AMARYL) 2 MG tablet Take 2 mg by mouth 2 (two) times daily.   ? Insulin Glargine (BASAGLAR KWIKPEN) 100 UNIT/ML Inject 35 Units into the skin at bedtime. (Patient taking differently: Inject 40 Units into the skin at bedtime.)  ? lisinopril (ZESTRIL) 40 MG tablet Take 40 mg by mouth daily.  ? Melatonin 10 MG TABS Take 10 mg by mouth at bedtime.  ? metFORMIN (GLUCOPHAGE-XR) 500 MG 24 hr tablet Take 1,000 mg by mouth in the morning and at bedtime.  ? mineral oil liquid Take 15 mLs by mouth daily as needed for moderate constipation.  ? Multiple Vitamins-Minerals (MULTIVITAMIN WITH MINERALS) tablet Take 1 tablet by mouth daily. Centrum men 50+  ? Omega-3 1000 MG CAPS Take 1,000 mg by mouth daily.  ? valACYclovir (VALTREX) 1000 MG tablet Take 1,000 mg by mouth at bedtime.  ? KLOR-CON M20 20 MEQ tablet TAKE 1 TABLET BY MOUTH EVERY DAY  ? ?No current  facility-administered medications for this visit. (Other)  ? ?REVIEW OF SYSTEMS: ?ROS   ?Positive for: Gastrointestinal, Endocrine, Cardiovascular, Eyes, Respiratory ?Negative for: Constitutional, Neurological, Skin, Genitourinary, Musculoskeletal, HENT, Psychiatric, Allergic/Imm, Heme/Lymph ?Last edited by Theodore Demark, COA on 02/02/2022  2:42 PM.  ?  ? ? ?ALLERGIES ?Allergies  ?Allergen Reactions  ? Azithromycin Nausea Only  ? Tape Rash and Other (See Comments)  ?  Causes skin redness, Use paper tape only.  ? ?PAST MEDICAL HISTORY ?Past Medical History:  ?Diagnosis Date  ? Arthritis   ? Atrial fibrillation ([place])   ? CAD (coronary artery disease)   ? a. Cath 03/17/15 showing 100%  ostial D1, 50% prox LAD to mid LAD, 40% RPDA stenosis. Med rx. // Myoview 01/2020: EF 31 diffuse perfusion defect without reversibility (suspect artifact); reviewed with Dr. Aggie Cosier study felt to be low risk  ? Cataract   ? Mixed form OD  ? Chronic diastolic CHF (congestive heart failure) ([place])   ? Diabetic retinopathy ([place])   ? NPDR OU  ? Essential hypertension   ? Glaucoma   ? POAG OU  ? History of gout   ? Hyperlipidemia   ? Hypertensive retinopathy   ? OU  ? Kidney stones   ? Melanoma of neck ([place])   ? NICM (nonischemic cardiomyopathy) ([place])   ? OSA on CPAP 2012  ? Prostate cancer ([place])   ? Type II diabetes mellitus ([place])   ? ?Past Surgical History:  ?Procedure Laterality Date  ? ABDOMINAL HERNIA REPAIR    ? w/mesh  ? ATRIAL FIBRILLATION ABLATION N/A 06/06/2021  ? Procedure: ATRIAL FIBRILLATION ABLATION;  Surgeon: Thompson Grayer, MD;  Location: [facility];  Service: Cardiovascular;  Laterality: N/A;  ? CARDIAC CATHETERIZATION N/A 03/17/2015  ? Procedure: Left Heart Cath and Coronary Angiography;  Surgeon: Lorretta Harp, MD;  Location: [facility];  Service: Cardiovascular;  Laterality: N/A;  ? CARDIOVERSION N/A 08/09/2021  ? Procedure: CARDIOVERSION;  Surgeon: [name], MD;  Location: [facility];  Service: Cardiovascular;  Laterality: N/A;  ? CARDIOVERSION N/A 11/10/2021  ? Procedure: CARDIOVERSION;  Surgeon: Berniece Salines, DO;  [facility];  Service: Cardiovascular;  Laterality: N/A;  ? carotid doppler  09/17/2008  ? rigt and left ICAs 0-49%;mildly  abnormal  ? CATARACT EXTRACTION Left 2020  ? Dr. Kathlen Mody  ? COLONOSCOPY N/A 05/16/2017  ? Procedure: COLONOSCOPY;  Surgeon: Rogene Houston, MD;  [facility];  Service: Endoscopy;  Laterality: N/A;  930  ? DOPPLER ECHOCARDIOGRAPHY  05/25/2009  ? EF 50-55%,LA mildly dilated, LV function normal  ? ELECTROPHYSIOLOGIC STUDY N/A 04/05/2015  ? Procedure: Cardioversion;  Surgeon: Sanda Klein, MD;  Location: [facility];  Service: Cardiovascular;  Laterality: N/A;  ? ELECTROPHYSIOLOGIC STUDY N/A 09/06/2015  ? Procedure: Atrial Fibrillation Ablation;  Surgeon: Thompson Grayer, MD;  Location: [facility];  Service: Cardiovascular;  Laterality: N/A;  ? ELECTROPHYSIOLOGIC STUDY N/A 07/12/2016  ? redo afib ablation by Dr Rayann Heman  ? EXCISIONAL HEMORRHOIDECTOMY    ? "inside and out"  ? EYE SURGERY Left 2020  ? Cat Sx - Dr. Kathlen Mody  ? FINE NEEDLE ASPIRATION Right   ? knee; "drew ~ 1 quart off"  ? HERNIA REPAIR    ? LAPAROSCOPIC CHOLECYSTECTOMY    ? MELANOMA EXCISION Right   ? "neck"  ? NM MYOCAR PERF WALL MOTION  02/21/2012  ? EF 61% ,EXERCISE 7 METS. exercise stopped due to wheezing and shortness of  breathe  ? POLYPECTOMY  05/16/2017  ? Procedure: POLYPECTOMY;  Surgeon: Rogene Houston, MD;  [facility];  Service: Endoscopy;;  colon  ? PROSTATECTOMY    ? SHOULDER OPEN ROTATOR CUFF REPAIR Right X 2  ? TEE WITHOUT CARDIOVERSION N/A 09/05/2015  ? Procedure: TRANSESOPHAGEAL ECHOCARDIOGRAM (TEE);  Surgeon: Sueanne Margarita, MD;  Location: [facility];  Service: Cardiovascular;  Laterality: N/A;  ? ?FAMILY HISTORY ?Family History  ?Problem Relation Age of Onset  ? Heart disease Mother   ? Lung cancer Mother   ? Heart attack Mother 18  ? Stroke Brother   ? Healthy Daughter   ? Diabetes Father   ? Heart disease Father   ? ?SOCIAL HISTORY ?Social History  ? ?Tobacco Use  ? Smoking status: Former  ?  Packs/day: 2.00  ?  Years: 27.00  ?  Pack years: 54.00  ?  Types: Cigarettes  ?  Quit date: 10/31/1983  ?  Years since quitting: 38.2  ? Smokeless tobacco: Never  ? Tobacco comments:  ?  Former smoker 09/27/21  ?Vaping Use  ? Vaping Use: Never used  ?Substance Use Topics  ? Alcohol use: No  ?  Alcohol/week: 0.0 standard drinks  ?  Comment: "used to drink; stopped ~ 2008"  ? Drug use: No  ?  ? ?  ?OPHTHALMIC EXAM: ?Base Eye Exam   ? ? Visual Acuity (Snellen - Linear)   ? ?   Right Left  ? Dist cc 20/25 -2 20/50 -2  ? Dist ph cc 20/20 -2  20/30  ? ? Correction: Glasses  ? ?  ?  ? ? Tonometry (Tonopen, 2:40 PM)   ? ?   Right Left  ? Pressure 15 16  ? ?  ?  ? ? Pupils   ? ?   Dark Light Shape React APD  ? Right 2 1 Round Brisk None  ? Left 2 1 Round

## 2022-02-02 ENCOUNTER — Encounter (INDEPENDENT_AMBULATORY_CARE_PROVIDER_SITE_OTHER): Payer: Self-pay | Admitting: Ophthalmology

## 2022-02-02 ENCOUNTER — Ambulatory Visit (INDEPENDENT_AMBULATORY_CARE_PROVIDER_SITE_OTHER): Payer: Medicare Other | Admitting: Ophthalmology

## 2022-02-02 DIAGNOSIS — H35033 Hypertensive retinopathy, bilateral: Secondary | ICD-10-CM | POA: Diagnosis not present

## 2022-02-02 DIAGNOSIS — Z8619 Personal history of other infectious and parasitic diseases: Secondary | ICD-10-CM

## 2022-02-02 DIAGNOSIS — H26492 Other secondary cataract, left eye: Secondary | ICD-10-CM

## 2022-02-02 DIAGNOSIS — I10 Essential (primary) hypertension: Secondary | ICD-10-CM

## 2022-02-02 DIAGNOSIS — E113213 Type 2 diabetes mellitus with mild nonproliferative diabetic retinopathy with macular edema, bilateral: Secondary | ICD-10-CM

## 2022-02-02 DIAGNOSIS — H18523 Epithelial (juvenile) corneal dystrophy, bilateral: Secondary | ICD-10-CM

## 2022-02-02 DIAGNOSIS — H401130 Primary open-angle glaucoma, bilateral, stage unspecified: Secondary | ICD-10-CM

## 2022-02-02 DIAGNOSIS — Z961 Presence of intraocular lens: Secondary | ICD-10-CM

## 2022-02-02 MED ORDER — AFLIBERCEPT 2MG/0.05ML IZ SOLN FOR KALEIDOSCOPE
2.0000 mg | INTRAVITREAL | Status: AC | PRN
  Administered 2022-02-02: 2 mg via INTRAVITREAL

## 2022-02-15 ENCOUNTER — Ambulatory Visit (HOSPITAL_COMMUNITY)
Admission: RE | Admit: 2022-02-15 | Discharge: 2022-02-15 | Disposition: A | Discharge status: Home / Self Care | Payer: Medicare Other | Source: Ambulatory Visit | Attending: Nurse Practitioner | Admitting: Nurse Practitioner

## 2022-02-15 VITALS — BP 144/64 | HR 54 | Ht 70.0 in | Wt 250.4 lb

## 2022-02-15 DIAGNOSIS — Z634 Disappearance and death of family member: Secondary | ICD-10-CM | POA: Insufficient documentation

## 2022-02-15 DIAGNOSIS — Z8249 Family history of ischemic heart disease and other diseases of the circulatory system: Secondary | ICD-10-CM | POA: Insufficient documentation

## 2022-02-15 DIAGNOSIS — D6869 Other thrombophilia: Secondary | ICD-10-CM

## 2022-02-15 DIAGNOSIS — I440 Atrioventricular block, first degree: Secondary | ICD-10-CM | POA: Diagnosis not present

## 2022-02-15 DIAGNOSIS — R9431 Abnormal electrocardiogram [ECG] [EKG]: Secondary | ICD-10-CM | POA: Insufficient documentation

## 2022-02-15 DIAGNOSIS — Z7901 Long term (current) use of anticoagulants: Secondary | ICD-10-CM | POA: Diagnosis not present

## 2022-02-15 DIAGNOSIS — R001 Bradycardia, unspecified: Secondary | ICD-10-CM | POA: Insufficient documentation

## 2022-02-15 DIAGNOSIS — I4510 Unspecified right bundle-branch block: Secondary | ICD-10-CM | POA: Insufficient documentation

## 2022-02-15 DIAGNOSIS — Z6835 Body mass index (BMI) 35.0-35.9, adult: Secondary | ICD-10-CM | POA: Diagnosis not present

## 2022-02-15 DIAGNOSIS — Z9889 Other specified postprocedural states: Secondary | ICD-10-CM | POA: Insufficient documentation

## 2022-02-15 DIAGNOSIS — G4733 Obstructive sleep apnea (adult) (pediatric): Secondary | ICD-10-CM | POA: Diagnosis not present

## 2022-02-15 DIAGNOSIS — I4819 Other persistent atrial fibrillation: Secondary | ICD-10-CM | POA: Diagnosis not present

## 2022-02-15 DIAGNOSIS — I110 Hypertensive heart disease with heart failure: Secondary | ICD-10-CM | POA: Diagnosis not present

## 2022-02-15 DIAGNOSIS — Z79899 Other long term (current) drug therapy: Secondary | ICD-10-CM | POA: Insufficient documentation

## 2022-02-15 DIAGNOSIS — I5042 Chronic combined systolic (congestive) and diastolic (congestive) heart failure: Secondary | ICD-10-CM | POA: Insufficient documentation

## 2022-02-15 LAB — COMPREHENSIVE METABOLIC PANEL
ALT: 17 U/L (ref 0–44)
AST: 22 U/L (ref 15–41)
Albumin: 3.8 g/dL (ref 3.5–5.0)
Alkaline Phosphatase: 58 U/L (ref 38–126)
Anion gap: 12 (ref 5–15)
BUN: 20 mg/dL (ref 8–23)
CO2: 29 mmol/L (ref 22–32)
Calcium: 8.9 mg/dL (ref 8.9–10.3)
Chloride: 97 mmol/L — ABNORMAL LOW (ref 98–111)
Creatinine, Ser: 1.42 mg/dL — ABNORMAL HIGH (ref 0.61–1.24)
GFR, Estimated: 50 mL/min — ABNORMAL LOW (ref >60)
Glucose, Bld: 240 mg/dL — ABNORMAL HIGH (ref 70–99)
Potassium: 4.7 mmol/L (ref 3.5–5.1)
Sodium: 138 mmol/L (ref 135–145)
Total Bilirubin: 0.6 mg/dL (ref 0.3–1.2)
Total Protein: 7.3 g/dL (ref 6.5–8.1)

## 2022-02-15 LAB — TSH: TSH: 3.266 u[IU]/mL (ref 0.350–4.500)

## 2022-02-15 NOTE — Progress Notes (Signed)
? ? ?Primary Care Physician: [name], MD ?Primary Cardiologist: Gwenlyn Found ?Primary Electrophysiologist: [place] ? ?Lucas Dudley is a 81 y.o. male with a history of persistent atrial fibrillation who presents for follow up in the [facility].  Since last being seen in clinic, the patient reports doing very well. afib burden low. No sustained AF. No bleeding issues on [place]. He did lose his wife unexpectedly a couple of months ago and is still grieving over this. He has chronic LLE, which he feels is at his baseline . He states that the legs go  down by the morning and fluid accumulates during the day.  ? ?F/u in afib clinic, 07/04/21. He underwent repeat ablation one month ago. He is in rate controlled afib. He states that he went to his PCP a few days after ablation and was in afib then and has persisted  since then. He feels like his weight is up 10 lbs, some from food consumption but feels his abdomen is swollen. He eats out almost every meal since his wife died. He wnet into persistent afib in the spring but was not ready for repeat ablation. He is not favorable to a cardioversion today. He feels very winded. +LLE. Previously failed tikosyn.  ? ?F/u in afib clinic, 07/12/21. He remains in rate controlled afib. He still does not want a cardioversion. I switched him to torsemide on last visit for some fluid/weight gain. Previously;y on 60 mg lasix daily. He states that it was expensive, his weight is up another 2 lbs and he feels that he did better on lasix. He states that his legs are much less swollen  in the am, and he feels good.  ? ?F/u in afib clinic, 12/14. He has been loading on amiodarone for one month and is ready for cardioversion. Inially, he said he did  not want another cardioversion but explained to pt that amiodarone is not achieving full effectiveness to restore SR  unless followed by cardioversion or else stop the drug. He is agreeable now to pursue cardioversion. No  missed anticoagulation. His fluid weight is staying normal.  ? ?F/u in afib clinic. 11/10/21, for pre procedural note for cardioversion after loading on amiodarone x the last several months. Pt did not initially want a repeat cardioversion but on last visit it was explained to pt we should stop amiodarone if he did not want to go thru with cardioversion to restore SR. He is her with his daughter today. They are both nervous regarding him developing acute pulmonary edema that happened after the cardioversion last fall, that resulted in admission  to the hospital. Today, he weighs 254 lbs dressed, but feels 5 lbs of this is shoes/clothes. He was 260 lbs when he went into the last cardioversion and he was much more short of breath going into the procedure. He was 241 when  he came out of the hospital  last fall, weight has climbed to the upper 240's gradually. No recent sudden weight gain.  He is rate controlled today. After discussing this, they are both willing to go thru with cardioversion. No missed anticoagulation.   ? ?F/u in afib clinic,s/p successful cardioversion, after loading on amiodarone for over one month. He remains in SR. He called after cardioversion feeling like he was retaining some fluid. I increased lasix to 60 mg bid for several days and he's weight is down 2 lbs today and he feels better. He will go back to Lasix 40 mg  bid tomorrow. Bmet today.  ? ?F/u in afib clinic, 02/15/22. He is in SR today and feels he is staying in rhythm. His weight is stable and he feels his fluid has been  manageable with current lasix dose. O complaints voiced.  ? ?Today, he  denies symptoms of palpitations, chest pain, +for shortness of breath,  and lower extremity edema, neg for dizziness, presyncope, syncope, snoring, daytime somnolence, bleeding, or neurologic sequela. The patient is tolerating medications without difficulties and is otherwise without complaint today.  ? ? ?Atrial Fibrillation Risk Factors: ? ?he does  have symptoms or diagnosis of sleep apnea. ?he is compliant with CPAP therapy. ? ?he does not have a history of rheumatic fever. ? ?he does not have a history of alcohol use. ? ?he has a BMI of Body mass index is 35.93 kg/m?Marland KitchenMarland Kitchen ?Filed Weights  ? 02/15/22 1427  ?Weight: 113.6 kg  ? ? ?LA size: 40 ? ? ?Atrial Fibrillation Management history: ? ?Previous antiarrhythmic drugs: Tikosyn ? ?Previous cardioversions: 2016  ? ?Previous ablations: 2016. 06/06/21 ? ?CHADS2VASC score: 5 ? ?Anticoagulation history: Eliquis ? ? ?Past Medical History:  ?Diagnosis Date  ? Arthritis   ? Atrial fibrillation ([place])   ? CAD (coronary artery disease)   ? a. Cath 03/17/15 showing 100% ostial D1, 50% prox LAD to mid LAD, 40% RPDA stenosis. Med rx. // Myoview 01/2020: EF 31 diffuse perfusion defect without reversibility (suspect artifact); reviewed with Dr. Aggie Cosier study felt to be low risk  ? Cataract   ? Mixed form OD  ? Chronic diastolic CHF (congestive heart failure) ([place])   ? Diabetic retinopathy ([place])   ? NPDR OU  ? Essential hypertension   ? Glaucoma   ? POAG OU  ? History of gout   ? Hyperlipidemia   ? Hypertensive retinopathy   ? OU  ? Kidney stones   ? Melanoma of neck ([place])   ? NICM (nonischemic cardiomyopathy) ([place])   ? OSA on CPAP 2012  ? Prostate cancer ([place])   ? Type II diabetes mellitus ([place])   ? ?Past Surgical History:  ?Procedure Laterality Date  ? ABDOMINAL HERNIA REPAIR    ? w/mesh  ? ATRIAL FIBRILLATION ABLATION N/A 06/06/2021  ? Procedure: ATRIAL FIBRILLATION ABLATION;  Surgeon: Thompson Grayer, MD;  Location: [facility];  Service: Cardiovascular;  Laterality: N/A;  ? CARDIAC CATHETERIZATION N/A 03/17/2015  ? Procedure: Left Heart Cath and Coronary Angiography;  Surgeon: Lorretta Harp, MD;  Location: [facility];  Service: Cardiovascular;  Laterality: N/A;  ? CARDIOVERSION N/A 08/09/2021  ? Procedure: CARDIOVERSION;  Surgeon: [name], MD;  Location: [facility];  Service: Cardiovascular;   Laterality: N/A;  ? CARDIOVERSION N/A 11/10/2021  ? Procedure: CARDIOVERSION;  Surgeon: Berniece Salines, DO;  [facility];  Service: Cardiovascular;  Laterality: N/A;  ? carotid doppler  09/17/2008  ? rigt and left ICAs 0-49%;mildly  abnormal  ? CATARACT EXTRACTION Left 2020  ? Dr. Kathlen Mody  ? COLONOSCOPY N/A 05/16/2017  ? Procedure: COLONOSCOPY;  Surgeon: Rogene Houston, MD;  [facility];  Service: Endoscopy;  Laterality: N/A;  930  ? DOPPLER ECHOCARDIOGRAPHY  05/25/2009  ? EF 50-55%,LA mildly dilated, LV function normal  ? ELECTROPHYSIOLOGIC STUDY N/A 04/05/2015  ? Procedure: Cardioversion;  Surgeon: Sanda Klein, MD;  Location: [facility];  Service: Cardiovascular;  Laterality: N/A;  ? ELECTROPHYSIOLOGIC STUDY N/A 09/06/2015  ? Procedure: Atrial Fibrillation Ablation;  Surgeon: Thompson Grayer, MD;  Location:  [facility];  Service: Cardiovascular;  Laterality: N/A;  ? ELECTROPHYSIOLOGIC STUDY N/A 07/12/2016  ? redo afib ablation by Dr Rayann Heman  ? EXCISIONAL HEMORRHOIDECTOMY    ? "inside and out"  ? EYE SURGERY Left 2020  ? Cat Sx - Dr. Kathlen Mody  ? FINE NEEDLE ASPIRATION Right   ? knee; "drew ~ 1 quart off"  ? HERNIA REPAIR    ? LAPAROSCOPIC CHOLECYSTECTOMY    ? MELANOMA EXCISION Right   ? "neck"  ? NM MYOCAR PERF WALL MOTION  02/21/2012  ? EF 61% ,EXERCISE 7 METS. exercise stopped due to wheezing and shortness of breathe  ? POLYPECTOMY  05/16/2017  ? Procedure: POLYPECTOMY;  Surgeon: Rogene Houston, MD;  [facility];  Service: Endoscopy;;  colon  ? PROSTATECTOMY    ? SHOULDER OPEN ROTATOR CUFF REPAIR Right X 2  ? TEE WITHOUT CARDIOVERSION N/A 09/05/2015  ? Procedure: TRANSESOPHAGEAL ECHOCARDIOGRAM (TEE);  Surgeon: Sueanne Margarita, MD;  Location: [facility];  Service: Cardiovascular;  Laterality: N/A;  ? ? ?Current Outpatient Medications  ?Medication Sig Dispense Refill  ? albuterol (VENTOLIN HFA) 108 (90 Base) MCG/ACT inhaler Inhale 2 puffs into the lungs every 4 (four) hours  as needed for wheezing or shortness of breath.    ? amiodarone (PACERONE) 200 MG tablet TAKE 1 TABLET BY MOUTH EVERY DAY 90 tablet 1  ? amLODipine (NORVASC) 10 MG tablet Take 1 tablet (10 mg total) by mout

## 2022-02-26 ENCOUNTER — Ambulatory Visit: Payer: Medicare Other | Admitting: Cardiology

## 2022-02-26 ENCOUNTER — Encounter: Payer: Self-pay | Admitting: Cardiology

## 2022-02-26 VITALS — BP 112/50 | HR 58 | Ht 70.0 in | Wt 249.0 lb

## 2022-02-26 DIAGNOSIS — I4819 Other persistent atrial fibrillation: Secondary | ICD-10-CM

## 2022-02-26 DIAGNOSIS — I10 Essential (primary) hypertension: Secondary | ICD-10-CM | POA: Diagnosis not present

## 2022-02-26 DIAGNOSIS — I25119 Atherosclerotic heart disease of native coronary artery with unspecified angina pectoris: Secondary | ICD-10-CM | POA: Diagnosis not present

## 2022-02-26 NOTE — Patient Instructions (Addendum)

## 2022-02-26 NOTE — Progress Notes (Signed)
? ? ?Cardiology Office Note ? ?Date: 02/26/2022  ? ?ID: Lucas Dudley, DOB 05/07/1941, MRN [id number] ? ?PCP:  [name], MD  ?Cardiologist:  Rozann Lesches, MD ?Electrophysiologist:  Thompson Grayer, MD  ? ?Chief Complaint  ?Patient presents with  ? Cardiac follow-up  ? ? ?History of Present Illness: ?Lucas Dudley is a 81 y.o. male last seen in May 2022.  He has had recent interval follow-up in the atrial fibrillation clinic, including earlier this month.  I reviewed the notes. ? ?He reports no recent palpitations and states that he has been doing well on his current regimen. ? ?CHA2DS2-VASc score is 5.  He is status post atrial fibrillation ablation, remains on amiodarone for rhythm suppression as well.  I reviewed his recent lab work.  He does not report any spontaneous bleeding problems.  Does bruise easily. ? ?Past Medical History:  ?Diagnosis Date  ? Arthritis   ? Atrial fibrillation ([place])   ? CAD (coronary artery disease)   ? a. Cath 03/17/15 showing 100% ostial D1, 50% prox LAD to mid LAD, 40% RPDA stenosis. Med rx. // Myoview 01/2020: EF 31 diffuse perfusion defect without reversibility (suspect artifact); reviewed with Dr. Aggie Cosier study felt to be low risk  ? Cataract   ? Mixed form OD  ? Chronic diastolic CHF (congestive heart failure) ([place])   ? Diabetic retinopathy ([place])   ? NPDR OU  ? Essential hypertension   ? Glaucoma   ? POAG OU  ? History of gout   ? Hyperlipidemia   ? Hypertensive retinopathy   ? OU  ? Kidney stones   ? Melanoma of neck ([place])   ? NICM (nonischemic cardiomyopathy) ([place])   ? OSA on CPAP 2012  ? Prostate cancer ([place])   ? Type II diabetes mellitus ([place])   ? ? ?Past Surgical History:  ?Procedure Laterality Date  ? ABDOMINAL HERNIA REPAIR    ? w/mesh  ? ATRIAL FIBRILLATION ABLATION N/A 06/06/2021  ? Procedure: ATRIAL FIBRILLATION ABLATION;  Surgeon: Thompson Grayer, MD;  Location: [facility];  Service: Cardiovascular;  Laterality: N/A;  ? CARDIAC CATHETERIZATION N/A 03/17/2015   ? Procedure: Left Heart Cath and Coronary Angiography;  Surgeon: Lorretta Harp, MD;  Location: [facility];  Service: Cardiovascular;  Laterality: N/A;  ? CARDIOVERSION N/A 08/09/2021  ? Procedure: CARDIOVERSION;  Surgeon: [name], MD;  Location: [facility];  Service: Cardiovascular;  Laterality: N/A;  ? CARDIOVERSION N/A 11/10/2021  ? Procedure: CARDIOVERSION;  Surgeon: Berniece Salines, DO;  Location: [facility];  Service: Cardiovascular;  Laterality: N/A;  ? carotid doppler  09/17/2008  ? rigt and left ICAs 0-49%;mildly  abnormal  ? CATARACT EXTRACTION Left 2020  ? Dr. Kathlen Mody  ? COLONOSCOPY N/A 05/16/2017  ? Procedure: COLONOSCOPY;  Surgeon: Rogene Houston, MD;  [facility]: [facility];  Service: Endoscopy;  Laterality: N/A;  930  ? DOPPLER ECHOCARDIOGRAPHY  05/25/2009  ? EF 50-55%,LA mildly dilated, LV function normal  ? ELECTROPHYSIOLOGIC STUDY N/A 04/05/2015  ? Procedure: Cardioversion;  Surgeon: Sanda Klein, MD;  Location: [facility];  Service: Cardiovascular;  Laterality: N/A;  ? ELECTROPHYSIOLOGIC STUDY N/A 09/06/2015  ? Procedure: Atrial Fibrillation Ablation;  Surgeon: Thompson Grayer, MD;  Location: [facility];  Service: Cardiovascular;  Laterality: N/A;  ? ELECTROPHYSIOLOGIC STUDY N/A 07/12/2016  ? redo afib ablation by Dr Rayann Heman  ? EXCISIONAL HEMORRHOIDECTOMY    ? "inside and out"  ? EYE SURGERY Left 2020  ?  Cat Sx - Dr. Kathlen Mody  ? FINE NEEDLE ASPIRATION Right   ? knee; "drew ~ 1 quart off"  ? HERNIA REPAIR    ? LAPAROSCOPIC CHOLECYSTECTOMY    ? MELANOMA EXCISION Right   ? "neck"  ? NM MYOCAR PERF WALL MOTION  02/21/2012  ? EF 61% ,EXERCISE 7 METS. exercise stopped due to wheezing and shortness of breathe  ? POLYPECTOMY  05/16/2017  ? Procedure: POLYPECTOMY;  Surgeon: Rogene Houston, MD;  [facility];  Service: Endoscopy;;  colon  ? PROSTATECTOMY    ? SHOULDER OPEN ROTATOR CUFF REPAIR Right X 2  ? TEE WITHOUT CARDIOVERSION N/A 09/05/2015  ? Procedure:  TRANSESOPHAGEAL ECHOCARDIOGRAM (TEE);  Surgeon: Sueanne Margarita, MD;  Location: [facility];  Service: Cardiovascular;  Laterality: N/A;  ? ? ?Current Outpatient Medications  ?Medication Sig Dispense Refill  ? amiodarone (PACERONE) 200 MG tablet TAKE 1 TABLET BY MOUTH EVERY DAY 90 tablet 1  ? amLODipine (NORVASC) 10 MG tablet Take 1 tablet (10 mg total) by mouth at bedtime. 90 tablet 3  ? carvedilol (COREG) 6.25 MG tablet Takes 1 tablet by mouth in the AM and 1 tablet by mouth in the PM    ? Cholecalciferol (VITAMIN D-3 PO) Take 1,000 mg by mouth daily.    ? COMBIGAN 0.2-0.5 % ophthalmic solution Apply 1 drop to eye 2 (two) times daily.    ? CRANBERRY PO Take 1 tablet by mouth daily.    ? diphenhydramine-acetaminophen (TYLENOL PM) 25-500 MG TABS tablet Take 2 tablets by mouth at bedtime.    ? Docusate Calcium (STOOL SOFTENER PO) Take 100 mg by mouth every evening.    ? dorzolamide-timolol (COSOPT) 22.3-6.8 MG/ML ophthalmic solution Place 1 drop into both eyes 2 (two) times daily.    ? ELIQUIS 5 MG TABS tablet TAKE 1 TABLET BY MOUTH TWICE A DAY 180 tablet 1  ? furosemide (LASIX) 40 MG tablet TAKE 1 TABLET BY MOUTH TWICE A DAY 180 tablet 2  ? gemfibrozil (LOPID) 600 MG tablet Take 600 mg by mouth at bedtime.     ? glimepiride (AMARYL) 2 MG tablet Take 2 mg by mouth 2 (two) times daily.     ? Insulin Glargine (BASAGLAR KWIKPEN) 100 UNIT/ML Inject 35 Units into the skin at bedtime. (Patient taking differently: Inject 40 Units into the skin at bedtime.) 40 mL   ? KLOR-CON M20 20 MEQ tablet TAKE 1 TABLET BY MOUTH EVERY DAY 90 tablet 1  ? lisinopril (ZESTRIL) 40 MG tablet Take 40 mg by mouth daily.    ? Melatonin 10 MG TABS Take 10 mg by mouth at bedtime.    ? metFORMIN (GLUCOPHAGE-XR) 500 MG 24 hr tablet Take 1,000 mg by mouth in the morning and at bedtime.    ? mineral oil liquid Take 15 mLs by mouth daily as needed for moderate constipation.    ? Multiple Vitamins-Minerals (MULTIVITAMIN WITH MINERALS) tablet Take 1  tablet by mouth daily. Centrum men 50+    ? Omega-3 1000 MG CAPS Take 1,000 mg by mouth daily.    ? OZEMPIC, 1 MG/DOSE, 4 MG/3ML SOPN Inject 1 mg into the skin once a week.    ? valACYclovir (VALTREX) 1000 MG tablet Take 1,000 mg by mouth at bedtime.    ? albuterol (VENTOLIN HFA) 108 (90 Base) MCG/ACT inhaler Inhale 2 puffs into the lungs every 4 (four) hours as needed for wheezing or shortness of breath. (Patient not taking: Reported on 02/26/2022)    ? ?  No current facility-administered medications for this visit.  ? ?Allergies:  Azithromycin and Tape  ? ?ROS: No chest pain.  Occasional orthostatic lightheadedness, no syncope. ? ?Physical Exam: ?VS:  BP (!) 112/50 (BP Location: Right Arm, Patient Position: Sitting, Cuff Size: Large)   Pulse (!) 58   Ht '5\' 10"'$  (1.778 m)   Wt 249 lb (112.9 kg)   SpO2 98%   BMI 35.73 kg/m? , BMI Body mass index is 35.73 kg/m?. ? ?Wt Readings from Last 3 Encounters:  ?02/26/22 249 lb (112.9 kg)  ?02/15/22 250 lb 6.4 oz (113.6 kg)  ?11/16/21 252 lb 9.6 oz (114.6 kg)  ?  ?General: Patient appears comfortable at rest. ?HEENT: Conjunctiva and lids normal, oropharynx clear. ?Neck: Supple, no elevated JVP or carotid bruits, no thyromegaly. ?Lungs: Clear to auscultation, nonlabored breathing at rest. ?Cardiac: Regular rate and rhythm, no S3, 1/6 significant systolic murmur. ?Extremities: No pitting edema. ? ?ECG:  An ECG dated 02/15/2022 was personally reviewed today and demonstrated:  Sinus bradycardia with prolonged PR interval, decreased R wave progression, left anterior fascicular block. ? ?Recent Labwork: ?08/09/2021: B Natriuretic Peptide 240.0 ?08/15/2021: Magnesium 1.9 ?11/10/2021: Hemoglobin 13.1; Platelets 288 ?02/15/2022: ALT 17; AST 22; BUN 20; Creatinine, Ser 1.42; Potassium 4.7; Sodium 138; TSH 3.266  ? ?Other Studies Reviewed Today: ? ?Echocardiogram 08/10/2021: ? 1. Left ventricular ejection fraction, by estimation, is 60 to 65%. The  ?left ventricle has normal function. Left  ventricular endocardial border  ?not optimally defined to evaluate regional wall motion. There is mild left  ?ventricular hypertrophy. Left  ?ventricular diastolic parameters are indeterminate.  ? 2. Right ventricular

## 2022-03-07 NOTE — Progress Notes (Signed)
[facility] Note  03/09/2022    CHIEF COMPLAINT Patient presents for Retina Follow Up  HISTORY OF PRESENT ILLNESS: Lucas Dudley is a 81 y.o. male who presents to the clinic today for:  HPI     Retina Follow Up   Patient presents with  Diabetic Retinopathy.  In both eyes.  This started 5 weeks ago.  I, the attending physician,  performed the HPI with the patient and updated documentation appropriately.        Comments   Patient here for 5 weeks retina follow up for NPDR OU. Patient states vision some days distance is blurry, but up close is clearer. No eye pain.       Last edited by Bernarda Caffey, MD on 03/13/2022 12:53 AM.     Referring physician: [name], MD [place],  [place] [address]  HISTORICAL INFORMATION:   Selected notes from the MEDICAL RECORD NUMBER Referred by Dr. Kathlen Mody for DEE   CURRENT MEDICATIONS: Current Outpatient Medications (Ophthalmic Drugs)  Medication Sig   COMBIGAN 0.2-0.5 % ophthalmic solution Apply 1 drop to eye 2 (two) times daily.   dorzolamide-timolol (COSOPT) 22.3-6.8 MG/ML ophthalmic solution Place 1 drop into both eyes 2 (two) times daily.   No current facility-administered medications for this visit. (Ophthalmic Drugs)   Current Outpatient Medications (Other)  Medication Sig   amiodarone (PACERONE) 200 MG tablet TAKE 1 TABLET BY MOUTH EVERY DAY   amLODipine (NORVASC) 10 MG tablet Take 1 tablet (10 mg total) by mouth at bedtime.   carvedilol (COREG) 6.25 MG tablet Takes 1 tablet by mouth in the AM and 1 tablet by mouth in the PM   Cholecalciferol (VITAMIN D-3 PO) Take 1,000 mg by mouth daily.   CRANBERRY PO Take 1 tablet by mouth daily.   diphenhydramine-acetaminophen (TYLENOL PM) 25-500 MG TABS tablet Take 2 tablets by mouth at bedtime.   Docusate Calcium (STOOL SOFTENER PO) Take 100 mg by mouth every evening.   ELIQUIS 5 MG TABS tablet TAKE 1 TABLET BY MOUTH TWICE A DAY   furosemide  (LASIX) 40 MG tablet TAKE 1 TABLET BY MOUTH TWICE A DAY   gemfibrozil (LOPID) 600 MG tablet Take 600 mg by mouth at bedtime.    glimepiride (AMARYL) 2 MG tablet Take 2 mg by mouth 2 (two) times daily.    Insulin Glargine (BASAGLAR KWIKPEN) 100 UNIT/ML Inject 35 Units into the skin at bedtime. (Patient taking differently: Inject 40 Units into the skin at bedtime.)   KLOR-CON M20 20 MEQ tablet TAKE 1 TABLET BY MOUTH EVERY DAY   lisinopril (ZESTRIL) 40 MG tablet Take 40 mg by mouth daily.   Melatonin 10 MG TABS Take 10 mg by mouth at bedtime.   metFORMIN (GLUCOPHAGE-XR) 500 MG 24 hr tablet Take 1,000 mg by mouth in the morning and at bedtime.   mineral oil liquid Take 15 mLs by mouth daily as needed for moderate constipation.   Multiple Vitamins-Minerals (MULTIVITAMIN WITH MINERALS) tablet Take 1 tablet by mouth daily. Centrum men 50+   Omega-3 1000 MG CAPS Take 1,000 mg by mouth daily.   OZEMPIC, 1 MG/DOSE, 4 MG/3ML SOPN Inject 1 mg into the skin once a week.   valACYclovir (VALTREX) 1000 MG tablet Take 1,000 mg by mouth at bedtime.   albuterol (VENTOLIN HFA) 108 (90 Base) MCG/ACT inhaler Inhale 2 puffs into the lungs every 4 (four) hours as needed for wheezing or shortness of breath. (Patient  not taking: Reported on 02/26/2022)   No current facility-administered medications for this visit. (Other)   REVIEW OF SYSTEMS: ROS   Positive for: Gastrointestinal, Endocrine, Cardiovascular, Eyes, Respiratory Negative for: Constitutional, Neurological, Skin, Genitourinary, Musculoskeletal, HENT, Psychiatric, Allergic/Imm, Heme/Lymph Last edited by Theodore Demark, COA on 03/09/2022  2:35 PM.     ALLERGIES Allergies  Allergen Reactions   Azithromycin Nausea Only   Tape Rash and Other (See Comments)    Causes skin redness, Use paper tape only.   PAST MEDICAL HISTORY Past Medical History:  Diagnosis Date   Arthritis    Atrial fibrillation (HCC)    CAD (coronary artery disease)    a. Cath  03/17/15 showing 100% ostial D1, 50% prox LAD to mid LAD, 40% RPDA stenosis. Med rx. // Myoview 01/2020: EF 31 diffuse perfusion defect without reversibility (suspect artifact); reviewed with Dr. Aggie Cosier study felt to be low risk   Cataract    Mixed form OD   Chronic diastolic CHF (congestive heart failure) (HCC)    Diabetic retinopathy ([place])    NPDR OU   Essential hypertension    Glaucoma    POAG OU   History of gout    Hyperlipidemia    Hypertensive retinopathy    OU   Kidney stones    Melanoma of neck (HCC)    NICM (nonischemic cardiomyopathy) ([place])    OSA on CPAP 2012   Prostate cancer ([place])    Type II diabetes mellitus ([place])    Past Surgical History:  Procedure Laterality Date   ABDOMINAL HERNIA REPAIR     w/mesh   ATRIAL FIBRILLATION ABLATION N/A 06/06/2021   Procedure: ATRIAL FIBRILLATION ABLATION;  Surgeon: Thompson Grayer, MD;  Location: [facility];  Service: Cardiovascular;  Laterality: N/A;   CARDIAC CATHETERIZATION N/A 03/17/2015   Procedure: Left Heart Cath and Coronary Angiography;  Surgeon: Lorretta Harp, MD;  Location: [facility];  Service: Cardiovascular;  Laterality: N/A;   CARDIOVERSION N/A 08/09/2021   Procedure: CARDIOVERSION;  Surgeon: [name], MD;  Location: [facility];  Service: Cardiovascular;  Laterality: N/A;   CARDIOVERSION N/A 11/10/2021   Procedure: CARDIOVERSION;  Surgeon: Berniece Salines, DO;  Location: [facility];  Service: Cardiovascular;  Laterality: N/A;   carotid doppler  09/17/2008   rigt and left ICAs 0-49%;mildly  abnormal   CATARACT EXTRACTION Left 2020   Dr. Kathlen Mody   COLONOSCOPY N/A 05/16/2017   Procedure: COLONOSCOPY;  Surgeon: Rogene Houston, MD;  [facility];  Service: Endoscopy;  Laterality: N/A;  [place]  05/25/2009   EF 50-55%,LA mildly dilated, LV function normal   ELECTROPHYSIOLOGIC STUDY N/A 04/05/2015   Procedure: Cardioversion;  Surgeon: Sanda Klein, MD;   Location: [facility];  Service: Cardiovascular;  Laterality: N/A;   ELECTROPHYSIOLOGIC STUDY N/A 09/06/2015   Procedure: Atrial Fibrillation Ablation;  Surgeon: Thompson Grayer, MD;  Location: [facility];  Service: Cardiovascular;  Laterality: N/A;   ELECTROPHYSIOLOGIC STUDY N/A 07/12/2016   [place] afib ablation by Dr Rayann Heman   EXCISIONAL HEMORRHOIDECTOMY     "inside and out"   EYE SURGERY Left 2020   Cat Sx - Dr. Kathlen Mody   FINE NEEDLE ASPIRATION Right    knee; "drew ~ 1 quart off"   [place] Right    "neck"   NM MYOCAR PERF WALL MOTION  02/21/2012   EF 61% ,EXERCISE 7 METS.  exercise stopped due to wheezing and shortness of breathe   POLYPECTOMY  05/16/2017   Procedure: POLYPECTOMY;  Surgeon: Rogene Houston, MD;  [facility];  Service: Endoscopy;;  colon   PROSTATECTOMY     SHOULDER OPEN ROTATOR CUFF REPAIR Right X 2   TEE WITHOUT CARDIOVERSION N/A 09/05/2015   Procedure: TRANSESOPHAGEAL ECHOCARDIOGRAM (TEE);  Surgeon: Sueanne Margarita, MD;  [facility];  Service: Cardiovascular;  Laterality: N/A;   FAMILY HISTORY Family History  Problem Relation Age of Onset   Heart disease Mother    Lung cancer Mother    Heart attack Mother 56   Stroke Brother    Healthy Daughter    Diabetes Father    Heart disease Father    SOCIAL HISTORY Social History   Tobacco Use   Smoking status: Former    Packs/day: 2.00    Years: 27.00    Pack years: 54.00    Types: Cigarettes    Quit date: 10/31/1983    Years since quitting: 38.3   Smokeless tobacco: Never   Tobacco comments:    Former smoker 09/27/21  Vaping Use   Vaping Use: Never used  Substance Use Topics   Alcohol use: No    Alcohol/week: 0.0 standard drinks    Comment: "used to drink; stopped ~ 2008"   Drug use: No       OPHTHALMIC EXAM: Base Eye Exam     Visual Acuity (Snellen - Linear)       Right Left   Dist cc 20/25 -2 20/40 -2    Dist ph cc 20/20 -2 20/25 -2    Correction: Glasses         Tonometry (Tonopen, 2:31 PM)       Right Left   Pressure 14 15         Pupils       Dark Light Shape React APD   Right 2 1 Round Brisk None   Left 2 1 Round Brisk None         Visual Fields (Counting fingers)       Left Right    Full Full         Extraocular Movement       Right Left    Full, Ortho Full, Ortho         Neuro/Psych     Oriented x3: Yes   Mood/Affect: Normal         Dilation     Both eyes: 1.0% Mydriacyl, 2.5% Phenylephrine @ 2:31 PM           Slit Lamp and Fundus Exam     Slit Lamp Exam       Right Left   Lids/Lashes Dermato, mild MGD Dermato, mild MGD   Conjunctiva/Sclera Temporal pinguecula Temporal pinguecula   Cornea EBMD, mild haze, trace PEE, mild Debris in tear film, well healed cataract wound trace haze, trace PEE, well healed temporal cataract wounds, arcus   Anterior Chamber Deep and clear; narrow temporal angle Deep and clear   Iris Round and dilated, mild anterior bowing, No NVI Round and moderately dilated to 5.49m   Lens PCIOL in good position, trace PCO PC IOL in good position with open PC   Anterior Vitreous Synerisis Synerisis         Fundus Exam       Right Left   Disc Mild pallor, sharp, +cupping, thin inferior rim Mild pallor, sharp rim, +cupping, +PPA   C/D  Ratio 0.7 0.6   Macula Flat, blunted foveal reflex, trace central cystic changes - persistent, RPE mottling and clumping, no heme Flat blunted foveal reflex, clusters of fine MA nasal and superior to fovea--improved, +cystic changes -- persistent, focal punctate exudates superior to fovea - improving   Vessels attenuated, Tortuous Attenuated, mild av crossing changes, mild tortuousity   Periphery Attached, rare MA Attached. No heme.           Refraction     Wearing Rx       Sphere Cylinder Axis Add   Right -0.25 +2.00 158 +2.50   Left -1.00 +1.25 008 +2.50            IMAGING AND PROCEDURES  Imaging and Procedures for 03/09/2022  OCT, Retina - OU - Both Eyes       Right Eye Quality was good. Central Foveal Thickness: 336. Progression has improved. Findings include abnormal foveal contour, no SRF, vitreomacular adhesion , intraretinal fluid (persistent IRF superior and temporal fovea and macula -- slightly improved).   Left Eye Quality was good. Central Foveal Thickness: 313. Progression has been stable. Findings include abnormal foveal contour, intraretinal fluid, intraretinal hyper-reflective material (Persistent IRF/cystic changes; partial PVD).   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 CME - cystoid macular edema. PED - pigment epithelial detachment. IRF - intraretinal fluid. SRF - subretinal fluid. EZ - ellipsoid zone. ERM - epiretinal membrane. ORA - outer retinal atrophy. ORT - outer retinal tubulation. SRHM - subretinal hyper-reflective material. IRHM - intraretinal hyper-reflective material     Intravitreal Injection, Pharmacologic Agent - OS - Left Eye       Time Out 03/09/2022. 3:15 PM. Confirmed correct patient, procedure, site, and patient consented.   Anesthesia Topical anesthesia was used. Anesthetic medications included Lidocaine 2%, Proparacaine 0.5%.   Procedure Preparation included 5% betadine to ocular surface, eyelid speculum. A (32 g) needle was used.   Injection: 2 mg aflibercept 2 MG/0.05ML   Route: Intravitreal, Site: Left Eye   NDC: A3590391, Lot: [id number], Expiration date: 01/13/2023, Waste: 0 mL   Post-op Post injection exam found visual acuity of at least counting fingers. The patient tolerated the procedure well. There were no complications. The  patient received written and verbal post procedure care education. Post injection medications were not given.            ASSESSMENT/PLAN:   ICD-10-CM   1. 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, associated with type 2 diabetes mellitus (HCC)  L87.5643 OCT, Retina - OU - Both Eyes    Intravitreal Injection, Pharmacologic Agent - OS - Left Eye    aflibercept (EYLEA) SOLN 2 mg    2. Essential hypertension  I10     3. Hypertensive retinopathy of both eyes  H35.033     4. Pseudophakia of both eyes  Z96.1     5. 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 History of herpes zoster of eye  Z86.19     7. Primary open angle glaucoma of both eyes, unspecified glaucoma stage  H40.1130     8. PCO (posterior capsular opacification), left  H26.492      1. Mild non-proliferative diabetic retinopathy OU (  OS>OD) - FA 7.26.21 OD: Hazy images. Single focal MA superior to fovea.                                  OS: Perifoveal Mas w/late leakage - OCT shows diabetic macular edema OU (OS>OD) - s/p IVA OS # 1(07.26.21), #2 (08.24.21), #3 (09.21.21), #4 (10.22.21) -- IVA resistance - s/p IVE OS #1 (11.22.21), #2 (12.21.21), #3 (01.20.22), #4 (02.18.22), #5 (03.18.22), #6 (04.15.22), #7 (05.10.22) -- sample, #8 (06.08.22), #9 (07.12.22), #10 (08.12.22), #11 (09.16.22), #12 (10.21.22), #13 (11.18.22), #14 (12.21.22), #15 (01.20.23), #16 (02.17.23), #17 (03.24.23), #18 (04.21.23) - BCVA 20/20 OD; OS 20/25 from 20/150 (s/p YAG)  - OCT shows OD: persistent IRF superior and temporal fovea and macula - slightly improved; OS: persistent IRF/cystic changes; partial PVD at 5 wks  - recommend IVE OS #19 today, 05.26.23  - pt wishes to proceed with injection  - RBA of procedure discussed, questions answered - see procedure note - IVA informed consent obtained and signed, 07.26.21 (OS) - IVE informed consent obtained and signed, 04.21.23 (OS) -  Good Days approved Eylea until 10/14/22 - BCBS approved Eylea until 10/14/22 - f/u 5 weeks -- DFE/OCT, possible injection  2,3. Hypertensive retinopathy OU - discussed importance of tight BP control - monitor   4. Pseudophakia OU  - s/p CE/IOL OS (2020, Dr. Kathlen Mody)             - s/p CE/IOL OD (2022, Dr. Kathlen Mody)  - IOLs in good position, doing well  - monitor   5. 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 Susa Simmonds in August 2022  - s/p SuperK OS on September 25, 2021  6. History of herpes zoster/iritis OS  - on valtrex 1g daily---maintenance   7. POAG OU  - was under the expert management of Dr. Kathlen Mody  - IOP 10,11 today  - currently on cosopt BID OU  8. PCO OS  - s/p YAG cap OS (04.04.23) - completed Lotemax QID OS x5-7 days - BCVA 20/25 from 20/150   Ophthalmic Meds Ordered this visit:  Meds ordered this encounter  Medications   aflibercept (EYLEA) SOLN 2 mg     Return in about 5 weeks (around 04/13/2022) for DFE, OCT, possible injection.  There are no Patient Instructions on file for this visit.  This document serves as a record of services personally performed by Gardiner Sleeper, MD, PhD. It was created on their behalf by Leonie Douglas, an ophthalmic technician. The creation of this record is the provider's dictation and/or activities during the visit.    Electronically signed by: Leonie Douglas COA, 03/13/22  12:56 AM  Gardiner Sleeper, M.D., Ph.D. Diseases & Surgery of the Retina and Vitreous [facility]  I have reviewed the above documentation for accuracy and completeness, and I agree with the above. Gardiner Sleeper, M.D., Ph.D. 03/13/22 12:56 AM  Abbreviations: M myopia (nearsighted); A astigmatism; H hyperopia (farsighted); P presbyopia; Mrx spectacle prescription;  CTL contact lenses; OD right eye; OS left eye; OU both eyes  XT exotropia; ET esotropia; PEK punctate epithelial keratitis; PEE punctate epithelial erosions; DES dry eye  syndrome; MGD meibomian gland dysfunction; ATs artificial tears; PFAT's preservative free artificial tears; [place] nuclear sclerotic cataract; PSC posterior subcapsular cataract; ERM epi-retinal membrane; PVD posterior vitreous detachment; RD retinal detachment; DM diabetes mellitus; DR diabetic retinopathy; NPDR non-proliferative  diabetic retinopathy; PDR proliferative diabetic retinopathy; CSME clinically significant macular edema; DME diabetic macular edema; dbh dot blot hemorrhages; CWS cotton wool spot; POAG primary open angle glaucoma; C/D cup-to-disc ratio; HVF humphrey visual field; GVF goldmann visual field; OCT optical coherence tomography; IOP intraocular pressure; BRVO Branch retinal vein occlusion; CRVO central retinal vein occlusion; CRAO central retinal artery occlusion; BRAO branch retinal artery occlusion; RT retinal tear; SB scleral buckle; PPV pars plana vitrectomy; VH Vitreous hemorrhage; PRP panretinal laser photocoagulation; IVK intravitreal kenalog; VMT vitreomacular traction; MH Macular hole;  NVD neovascularization of the disc; NVE neovascularization elsewhere; AREDS age related eye disease study; ARMD age related macular degeneration; POAG primary open angle glaucoma; EBMD epithelial/anterior basement membrane dystrophy; ACIOL anterior chamber intraocular lens; IOL intraocular lens; PCIOL posterior chamber intraocular lens; Phaco/IOL phacoemulsification with intraocular lens placement; [place] photorefractive keratectomy; LASIK laser assisted in situ keratomileusis; HTN hypertension; DM diabetes mellitus; COPD chronic obstructive pulmonary disease

## 2022-03-09 ENCOUNTER — Encounter (INDEPENDENT_AMBULATORY_CARE_PROVIDER_SITE_OTHER): Payer: Self-pay | Admitting: Ophthalmology

## 2022-03-09 ENCOUNTER — Ambulatory Visit (INDEPENDENT_AMBULATORY_CARE_PROVIDER_SITE_OTHER): Payer: Medicare Other | Admitting: Ophthalmology

## 2022-03-09 DIAGNOSIS — H18523 Epithelial (juvenile) corneal dystrophy, bilateral: Secondary | ICD-10-CM | POA: Diagnosis not present

## 2022-03-09 DIAGNOSIS — Z961 Presence of intraocular lens: Secondary | ICD-10-CM

## 2022-03-09 DIAGNOSIS — H35033 Hypertensive retinopathy, bilateral: Secondary | ICD-10-CM | POA: Diagnosis not present

## 2022-03-09 DIAGNOSIS — H26492 Other secondary cataract, left eye: Secondary | ICD-10-CM

## 2022-03-09 DIAGNOSIS — H401130 Primary open-angle glaucoma, bilateral, stage unspecified: Secondary | ICD-10-CM

## 2022-03-09 DIAGNOSIS — E113213 Type 2 diabetes mellitus with mild nonproliferative diabetic retinopathy with macular edema, bilateral: Secondary | ICD-10-CM

## 2022-03-09 DIAGNOSIS — I10 Essential (primary) hypertension: Secondary | ICD-10-CM

## 2022-03-09 DIAGNOSIS — Z8619 Personal history of other infectious and parasitic diseases: Secondary | ICD-10-CM

## 2022-03-13 ENCOUNTER — Emergency Department (HOSPITAL_COMMUNITY)
Admission: EM | Admit: 2022-03-13 | Discharge: 2022-03-13 | Disposition: A | Discharge status: Home / Self Care | Payer: Medicare Other | Attending: Emergency Medicine | Admitting: Emergency Medicine

## 2022-03-13 ENCOUNTER — Emergency Department (HOSPITAL_COMMUNITY): Payer: Medicare Other

## 2022-03-13 ENCOUNTER — Other Ambulatory Visit: Payer: Self-pay

## 2022-03-13 ENCOUNTER — Encounter (INDEPENDENT_AMBULATORY_CARE_PROVIDER_SITE_OTHER): Payer: Self-pay | Admitting: Ophthalmology

## 2022-03-13 ENCOUNTER — Encounter (HOSPITAL_COMMUNITY): Payer: Self-pay | Admitting: Emergency Medicine

## 2022-03-13 DIAGNOSIS — Z79899 Other long term (current) drug therapy: Secondary | ICD-10-CM | POA: Diagnosis not present

## 2022-03-13 DIAGNOSIS — W01198A Fall on same level from slipping, tripping and stumbling with subsequent striking against other object, initial encounter: Secondary | ICD-10-CM | POA: Diagnosis not present

## 2022-03-13 DIAGNOSIS — Z7901 Long term (current) use of anticoagulants: Secondary | ICD-10-CM | POA: Insufficient documentation

## 2022-03-13 DIAGNOSIS — S2241XA Multiple fractures of ribs, right side, initial encounter for closed fracture: Secondary | ICD-10-CM | POA: Diagnosis not present

## 2022-03-13 DIAGNOSIS — Z794 Long term (current) use of insulin: Secondary | ICD-10-CM | POA: Diagnosis not present

## 2022-03-13 DIAGNOSIS — S299XXA Unspecified injury of thorax, initial encounter: Secondary | ICD-10-CM | POA: Diagnosis present

## 2022-03-13 MED ORDER — AFLIBERCEPT 2MG/0.05ML IZ SOLN FOR KALEIDOSCOPE
2.0000 mg | INTRAVITREAL | Status: AC | PRN
  Administered 2022-03-13: 2 mg via INTRAVITREAL

## 2022-03-13 MED ORDER — NAPROXEN 500 MG PO TABS: 500.0000 mg | ORAL_TABLET | Freq: Two times a day (BID) | ORAL | 0 refills | Status: DC

## 2022-03-13 MED ORDER — NAPROXEN 250 MG PO TABS
500.0000 mg | ORAL_TABLET | Freq: Once | ORAL | Status: AC
Start: 2022-03-13 — End: 2022-03-13
  Administered 2022-03-13: 500 mg via ORAL
  Filled 2022-03-13: qty 2

## 2022-03-13 NOTE — ED Provider Notes (Signed)
Right arm [facility] EMERGENCY DEPARTMENT Provider Note   CSN: [id number] Arrival date & time: 03/13/22  1128     History  Chief Complaint  Patient presents with   Lucas Dudley    Lucas Dudley is a 81 y.o. male.   Fall   This patient is a 81 year old male presenting to the hospital with a complaint of right shoulder and rib pain after a fall that occurred several days ago.  He reports that he was stepping down a cement step when he lost his footing and fell which impacted his right ribs.  He bumped his head but really does not have any headache.  He states that after sleeping last night he woke up this morning with increasing pain in the right rib cage.  Worse with taking a deep breath  Home Medications Prior to Admission medications   Medication Sig Start Date End Date Taking? Authorizing Provider  naproxen (NAPROSYN) 500 MG tablet Take 1 tablet (500 mg total) by mouth 2 (two) times daily with a meal. 03/13/22  Yes Noemi Chapel, MD  albuterol (VENTOLIN HFA) 108 (90 Base) MCG/ACT inhaler Inhale 2 puffs into the lungs every 4 (four) hours as needed for wheezing or shortness of breath. Patient not taking: Reported on 02/26/2022 10/27/21   [provider]  amiodarone (PACERONE) 200 MG tablet TAKE 1 TABLET BY MOUTH EVERY DAY 12/21/21   Sherran Needs, NP  amLODipine (NORVASC) 10 MG tablet Take 1 tablet (10 mg total) by mouth at bedtime. 09/27/21   Allred, Jeneen Rinks, MD  carvedilol (COREG) 6.25 MG tablet Takes 1 tablet by mouth in the AM and 1 tablet by mouth in the PM 07/04/21   Sherran Needs, NP  Cholecalciferol (VITAMIN D-3 PO) Take 1,000 mg by mouth daily.    [provider]  COMBIGAN 0.2-0.5 % ophthalmic solution Apply 1 drop to eye 2 (two) times daily. 10/16/21   [provider]  CRANBERRY PO Take 1 tablet by mouth daily.    [provider]  diphenhydramine-acetaminophen (TYLENOL PM) 25-500 MG TABS tablet Take 2 tablets by mouth at bedtime.    [provider]  Docusate Calcium (STOOL SOFTENER PO) Take 100 mg by mouth every evening.    [provider]  dorzolamide-timolol (COSOPT) 22.3-6.8 MG/ML ophthalmic solution Place 1 drop into both eyes 2 (two) times daily.    [provider]  ELIQUIS 5 MG TABS tablet TAKE 1 TABLET BY MOUTH TWICE A DAY 10/23/21   Sherran Needs, NP  furosemide (LASIX) 40 MG tablet TAKE 1 TABLET BY MOUTH TWICE A DAY 10/11/21   Sherran Needs, NP  gemfibrozil (LOPID) 600 MG tablet Take 600 mg by mouth at bedtime.     [provider]  glimepiride (AMARYL) 2 MG tablet Take 2 mg by mouth 2 (two) times daily.     [provider]  Insulin Glargine (BASAGLAR KWIKPEN) 100 UNIT/ML Inject 35 Units into the skin at bedtime. Patient taking differently: Inject 40 Units into the skin at bedtime. 08/15/21   Kathie Dike, MD  KLOR-CON M20 20 MEQ tablet TAKE 1 TABLET BY MOUTH EVERY DAY 12/21/21   Sherran Needs, NP  lisinopril (ZESTRIL) 40 MG tablet Take 40 mg by mouth daily. 11/22/20   [provider]  Melatonin 10 MG TABS Take 10 mg by mouth at bedtime.    [provider]  metFORMIN (GLUCOPHAGE-XR) 500 MG 24 hr tablet Take 1,000 mg by mouth in the morning  and at bedtime. 11/11/19   [provider]  mineral oil liquid Take 15 mLs by mouth daily as needed for moderate constipation.    [provider]  Multiple Vitamins-Minerals (MULTIVITAMIN WITH MINERALS) tablet Take 1 tablet by mouth daily. Centrum men 50+    [provider]  Omega-3 1000 MG CAPS Take 1,000 mg by mouth daily.    [provider]  OZEMPIC, 1 MG/DOSE, 4 MG/3ML SOPN Inject 1 mg into the skin once a week. 02/07/22   [provider]  valACYclovir (VALTREX) 1000 MG tablet Take 1,000 mg by mouth at bedtime. 02/12/19   [provider]      Allergies    Azithromycin and Tape    Review of Systems   Review of Systems  All other systems reviewed and are  negative.  Physical Exam Updated Vital Signs BP 128/83 (BP Location: Right Arm)   Pulse (!) 52   Temp 98.1 F (36.7 C) (Oral)   Ht 1.753 m ('5\' 9"'$ )   Wt 111.1 kg   SpO2 90%   BMI 36.18 kg/m  Physical Exam Vitals and nursing note reviewed.  Constitutional:      General: He is not in acute distress.    Appearance: He is well-developed.  HENT:     Head: Normocephalic and atraumatic.     Mouth/Throat:     Pharynx: No oropharyngeal exudate.  Eyes:     General: No scleral icterus.       Right eye: No discharge.        Left eye: No discharge.     Conjunctiva/sclera: Conjunctivae normal.     Pupils: Pupils are equal, round, and reactive to light.  Neck:     Thyroid: No thyromegaly.     Vascular: No JVD.  Cardiovascular:     Rate and Rhythm: Normal rate and regular rhythm.     Heart sounds: Normal heart sounds. No murmur heard.   No friction rub. No gallop.  Pulmonary:     Effort: Pulmonary effort is normal. No respiratory distress.     Breath sounds: Normal breath sounds. No wheezing or rales.     Comments: Tenderness over the right anterolateral ribs distally Chest:     Chest wall: Tenderness present.  Abdominal:     General: Bowel sounds are normal. There is no distension.     Palpations: Abdomen is soft. There is no mass.     Tenderness: There is no abdominal tenderness.  Musculoskeletal:        General: Tenderness present. Normal range of motion.     Cervical back: Normal range of motion and neck supple.     Comments: Tenderness over the right shoulder but able to fully range of motion the shoulder to both internal and external rotation as well as abduction and abduction although with some pain  Lymphadenopathy:     Cervical: No cervical adenopathy.  Skin:    General: Skin is warm and dry.     Findings: No erythema or rash.  Neurological:     Mental Status: He is alert.     Coordination: Coordination normal.     Comments: No numbness or weakness of the right upper  extremity  Psychiatric:        Behavior: Behavior normal.    ED Results / Procedures / Treatments   Labs (all labs ordered are listed, but only abnormal results are displayed) Labs Reviewed - No data to display  EKG EKG Interpretation  Date/Time:  Tuesday Mar 13 2022 11:58:53 EDT Ventricular Rate:  63 PR Interval:  294 QRS Duration: 96 QT Interval:  420 QTC Calculation: 429 R Axis:   -55 Text Interpretation: Sinus rhythm with 1st degree A-V block Left axis deviation Incomplete right bundle branch block Anteroseptal infarct (cited on or before 12-Aug-2021) Abnormal ECG When compared with ECG of 15-Feb-2022 14:43, Criteria for Inferior infarct are no longer Present Confirmed by Noemi Chapel [phone]) on 03/13/2022 1:15:34 PM  Radiology DG Ribs Unilateral W/Chest Right  Result Date: 03/13/2022 CLINICAL DATA:  [place].  Right rib pain. EXAM: RIGHT RIBS AND CHEST - 3+ VIEW COMPARISON:  Chest CT 05/31/2021 FINDINGS: The heart is enlarged but appears stable. Moderate eventration of the right hemidiaphragm. No pneumothorax. Small right pleural effusion. Acute nondisplaced right tenth anterior rib fracture noted. Remote healed right lower rib fractures are also noted. IMPRESSION: 1. Acute nondisplaced right tenth anterior rib fracture. 2. Small right pleural effusion. 3. No pneumothorax. Electronically Signed   By: Marijo Sanes M.D.   On: 03/13/2022 12:25   DG Shoulder Right  Result Date: 03/13/2022 CLINICAL DATA:  [place].  Right shoulder pain. EXAM: RIGHT SHOULDER - 2+ VIEW COMPARISON:  Prior chest x-rays. FINDINGS: Surgical changes noted involving the right shoulder with multiple bone anchor screws. Prior distal clavicle resection. No acute fracture or dislocation. IMPRESSION: Postoperative changes. No acute bony findings. Electronically Signed   By: Marijo Sanes M.D.   On: 03/13/2022 12:27   Procedures Procedures    Medications Ordered in ED Medications  naproxen (NAPROSYN) tablet 500 mg  (has no administration in time range)    ED Course/ Medical Decision Making/ A&P                           Medical Decision Making Amount and/or Complexity of Data Reviewed Radiology: ordered.  Risk Prescription drug management.   The patient's exam is consistent with a contusion of the shoulder, of note he has had a couple of prior rotator cuff repairs making this an injury that may be very well related to the rotator cuff or it could just be a contusion given the impact point.  He also has what appears to be a single nondisplaced fracture of the rib on the 10th rib on the right side.  This is consistent with where he is having pain.  He has an incentive spirometer at home and does not want a new one, he is agreeable to some anti-inflammatories, this has been prescribed, he is stable for discharge, he is agreeable to return should symptoms worsen.        Final Clinical Impression(s) / ED Diagnoses Final diagnoses:  Closed fracture of multiple ribs of right side, initial encounter    Rx / DC Orders ED Discharge Orders          Ordered    naproxen (NAPROSYN) 500 MG tablet  2 times daily with meals        03/13/22 1347              Noemi Chapel, MD 03/13/22 1358

## 2022-03-13 NOTE — ED Triage Notes (Signed)
Pt had fall on Saturday 03/10/22 miss stepping off curb, landed on right side striking head, right shoulder and right ribcage.

## 2022-03-13 NOTE — Discharge Instructions (Signed)
Please take Naprosyn, '500mg'$  by mouth twice daily as needed for pain - this in an antiinflammatory medicine (NSAID) and is similar to ibuprofen - many people feel that it is stronger than ibuprofen and it is easier to take since it is a smaller pill.  Please use this only for 1 week - if your pain persists, you will need to follow up with your doctor in the office for ongoing guidance and pain control.    You have broken ribs it sometimes can take several weeks to heal, take the Naprosyn twice a day, use the incentive spirometer breathing machine that you got when you had COVID 6 months ago every single day and see your doctor in 1 week, ER for severe or worsening difficulty breathing fever or pain

## 2022-03-13 NOTE — ED Notes (Signed)
Pt ambulated to the restroom.

## 2022-04-03 NOTE — Progress Notes (Shared)
[facility] Note  04/06/2022    CHIEF COMPLAINT Patient presents for No chief complaint on file.  HISTORY OF PRESENT ILLNESS: Lucas Dudley is a 81 y.o. male who presents to the clinic today for:    Referring physician: [name], MD [place],  [place] [id number]  HISTORICAL INFORMATION:   Selected notes from the MEDICAL RECORD NUMBER Referred by Dr. Kathlen Mody for DEE   CURRENT MEDICATIONS: Current Outpatient Medications (Ophthalmic Drugs)  Medication Sig   COMBIGAN 0.2-0.5 % ophthalmic solution Apply 1 drop to eye 2 (two) times daily.   dorzolamide-timolol (COSOPT) 22.3-6.8 MG/ML ophthalmic solution Place 1 drop into both eyes 2 (two) times daily.   No current facility-administered medications for this visit. (Ophthalmic Drugs)   Current Outpatient Medications (Other)  Medication Sig   albuterol (VENTOLIN HFA) 108 (90 Base) MCG/ACT inhaler Inhale 2 puffs into the lungs every 4 (four) hours as needed for wheezing or shortness of breath. (Patient not taking: Reported on 02/26/2022)   amiodarone (PACERONE) 200 MG tablet TAKE 1 TABLET BY MOUTH EVERY DAY   amLODipine (NORVASC) 10 MG tablet Take 1 tablet (10 mg total) by mouth at bedtime.   carvedilol (COREG) 6.25 MG tablet Takes 1 tablet by mouth in the AM and 1 tablet by mouth in the PM   Cholecalciferol (VITAMIN D-3 PO) Take 1,000 mg by mouth daily.   CRANBERRY PO Take 1 tablet by mouth daily.   diphenhydramine-acetaminophen (TYLENOL PM) 25-500 MG TABS tablet Take 2 tablets by mouth at bedtime.   Docusate Calcium (STOOL SOFTENER PO) Take 100 mg by mouth every evening.   ELIQUIS 5 MG TABS tablet TAKE 1 TABLET BY MOUTH TWICE A DAY   furosemide (LASIX) 40 MG tablet TAKE 1 TABLET BY MOUTH TWICE A DAY   gemfibrozil (LOPID) 600 MG tablet Take 600 mg by mouth at bedtime.    glimepiride (AMARYL) 2 MG tablet Take 2 mg by mouth 2 (two) times daily.    Insulin Glargine (BASAGLAR KWIKPEN) 100  UNIT/ML Inject 35 Units into the skin at bedtime. (Patient taking differently: Inject 40 Units into the skin at bedtime.)   KLOR-CON M20 20 MEQ tablet TAKE 1 TABLET BY MOUTH EVERY DAY   lisinopril (ZESTRIL) 40 MG tablet Take 40 mg by mouth daily.   Melatonin 10 MG TABS Take 10 mg by mouth at bedtime.   metFORMIN (GLUCOPHAGE-XR) 500 MG 24 hr tablet Take 1,000 mg by mouth in the morning and at bedtime.   mineral oil liquid Take 15 mLs by mouth daily as needed for moderate constipation.   Multiple Vitamins-Minerals (MULTIVITAMIN WITH MINERALS) tablet Take 1 tablet by mouth daily. Centrum men 50+   naproxen (NAPROSYN) 500 MG tablet Take 1 tablet (500 mg total) by mouth 2 (two) times daily with a meal.   Omega-3 1000 MG CAPS Take 1,000 mg by mouth daily.   OZEMPIC, 1 MG/DOSE, 4 MG/3ML SOPN Inject 1 mg into the skin once a week.   valACYclovir (VALTREX) 1000 MG tablet Take 1,000 mg by mouth at bedtime.   No current facility-administered medications for this visit. (Other)   REVIEW OF SYSTEMS:   ALLERGIES Allergies  Allergen Reactions   Azithromycin Nausea Only   Tape Rash and Other (See Comments)    Causes skin redness, Use paper tape only.   PAST MEDICAL HISTORY Past Medical History:  Diagnosis Date   Arthritis    Atrial fibrillation ([place])  CAD (coronary artery disease)    a. Cath 03/17/15 showing 100% ostial D1, 50% prox LAD to mid LAD, 40% RPDA stenosis. Med rx. // Myoview 01/2020: EF 31 diffuse perfusion defect without reversibility (suspect artifact); reviewed with Dr. Aggie Cosier study felt to be low risk   Cataract    Mixed form OD   Chronic diastolic CHF (congestive heart failure) (HCC)    Diabetic retinopathy ([place])    NPDR OU   Essential hypertension    Glaucoma    POAG OU   History of gout    Hyperlipidemia    Hypertensive retinopathy    OU   Kidney stones    Melanoma of neck (HCC)    NICM (nonischemic cardiomyopathy) ([place])    OSA on CPAP 2012   Prostate cancer  ([place])    Type II diabetes mellitus ([place])    Past Surgical History:  Procedure Laterality Date   ABDOMINAL HERNIA REPAIR     w/mesh   ATRIAL FIBRILLATION ABLATION N/A 06/06/2021   Procedure: ATRIAL FIBRILLATION ABLATION;  Surgeon: Thompson Grayer, MD;  Location: [facility] [place] [facility];  Service: Cardiovascular;  Laterality: N/A;   CARDIAC CATHETERIZATION N/A 03/17/2015   Procedure: Left Heart Cath and Coronary Angiography;  Surgeon: Lorretta Harp, MD;  Location: [place] [facility];  Service: Cardiovascular;  Laterality: N/A;   CARDIOVERSION N/A 08/09/2021   Procedure: CARDIOVERSION;  Surgeon: [name], MD;  Location: [facility];  Service: Cardiovascular;  Laterality: N/A;   CARDIOVERSION N/A 11/10/2021   Procedure: CARDIOVERSION;  Surgeon: Berniece Salines, DO;  Location: [facility];  Service: Cardiovascular;  Laterality: N/A;   carotid doppler  09/17/2008   rigt and left ICAs 0-49%;mildly  abnormal   CATARACT EXTRACTION Left 2020   Dr. Kathlen Mody   COLONOSCOPY N/A 05/16/2017   Procedure: COLONOSCOPY;  Surgeon: Rogene Houston, MD;  [facility];  Service: Endoscopy;  Laterality: N/A;  [place]  05/25/2009   EF 50-55%,LA mildly dilated, LV function normal   ELECTROPHYSIOLOGIC STUDY N/A 04/05/2015   Procedure: Cardioversion;  Surgeon: Sanda Klein, MD;  Location: [place] [facility];  Service: Cardiovascular;  Laterality: N/A;   ELECTROPHYSIOLOGIC STUDY N/A 09/06/2015   Procedure: Atrial Fibrillation Ablation;  Surgeon: Thompson Grayer, MD;  Location: [facility];  Service: Cardiovascular;  Laterality: N/A;   ELECTROPHYSIOLOGIC STUDY N/A 07/12/2016   [place] afib ablation by Dr Rayann Heman   EXCISIONAL HEMORRHOIDECTOMY     "inside and out"   EYE SURGERY Left 2020   [facility] Sx - Dr. Kathlen Mody   FINE NEEDLE ASPIRATION Right    knee; "drew ~ 1 quart off"   [place] Right    "neck"   NM MYOCAR PERF WALL  MOTION  02/21/2012   EF 61% ,EXERCISE 7 METS. exercise stopped due to wheezing and shortness of breathe   POLYPECTOMY  05/16/2017   Procedure: POLYPECTOMY;  Surgeon: Rogene Houston, MD;  [facility];  Service: Endoscopy;;  colon   PROSTATECTOMY     SHOULDER OPEN ROTATOR CUFF REPAIR Right X 2   TEE WITHOUT CARDIOVERSION N/A 09/05/2015   Procedure: TRANSESOPHAGEAL ECHOCARDIOGRAM (TEE);  Surgeon: Sueanne Margarita, MD;  Location[facility];  Service: Cardiovascular;  Laterality: N/A;   FAMILY HISTORY Family History  Problem Relation Age of Onset   Heart disease Mother    Lung cancer Mother    Heart attack Mother 55  Stroke Brother    Healthy Daughter    Diabetes Father    Heart disease Father    SOCIAL HISTORY Social History   Tobacco Use   Smoking status: Former    Packs/day: 2.00    Years: 27.00    Total pack years: 54.00    Types: Cigarettes    Quit date: 10/31/1983    Years since quitting: 38.4   Smokeless tobacco: Never   Tobacco comments:    Former smoker 09/27/21  Vaping Use   Vaping Use: Never used  Substance Use Topics   Alcohol use: No    Alcohol/week: 0.0 standard drinks of alcohol    Comment: "used to drink; stopped ~ 2008"   Drug use: No       OPHTHALMIC EXAM: Not recorded    IMAGING AND PROCEDURES  Imaging and Procedures for 04/06/2022          ASSESSMENT/PLAN:   ICD-10-CM   1. 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, associated with type 2 diabetes mellitus ([place])  V42.5956     2. Essential hypertension  I10     3. Hypertensive retinopathy of both eyes  H35.033     4. Pseudophakia of both eyes  Z96.1     5. 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 History of herpes zoster of eye  Z86.19     7. Primary open angle glaucoma of both eyes, unspecified glaucoma stage  H40.1130     8. PCO (posterior capsular opacification), left  H26.492       1. Mild  non-proliferative diabetic retinopathy OU (OS>OD) - FA 7.26.21 OD: Hazy images. Single focal MA superior to fovea; OS: Perifoveal Mas w/late leakage - OCT shows diabetic macular edema OU (OS>OD) - s/p IVA OS # 1(07.26.21), #2 (08.24.21), #3 (09.21.21), #4 (10.22.21) -- IVA resistance - s/p IVE OS #1 (11.22.21), #2 (12.21.21), #3 (01.20.22), #4 (02.18.22), #5 (03.18.22), #6 (04.15.22), #7 (05.10.22) -- sample, #8 (06.08.22), #9 (07.12.22), #10 (08.12.22), #11 (09.16.22), #12 (10.21.22), #13 (11.18.22), #14 (12.21.22), #15 (01.20.23), #16 (02.17.23), #17 (03.24.23), #18 (04.21.23), #19 (05.26.23) - BCVA 20/20 OD; OS 20/25 from 20/150 (s/p YAG)  - OCT shows OD: persistent IRF superior and temporal fovea and macula - slightly improved; OS: persistent IRF/cystic changes; partial PVD at 5 wks  - recommend IVE OS #20 today, 06.23.23  - pt wishes to proceed with injection  - RBA of procedure discussed, questions answered - see procedure note - IVA informed consent obtained and signed, 07.26.21 (OS) - IVE informed consent obtained and signed, 04.21.23 (OS) - Good Days approved [place] until 10/14/22 - [employer] approved [place] until 10/14/22 - f/u 5 weeks -- DFE/OCT, possible injection  2,3. Hypertensive retinopathy OU - discussed importance of tight BP control - monitor   4. Pseudophakia OU  - s/p CE/IOL OS (2020, Dr. Kathlen Mody)             - s/p CE/IOL OD (2022, Dr. Kathlen Mody)  - IOLs in good position, doing well  - monitor   5. 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 Susa Simmonds in August 2022  - s/p SuperK OS on September 25, 2021  6. History of herpes zoster/iritis OS  - on valtrex 1g daily---maintenance   7. POAG OU  - was under the expert management of Dr. Kathlen Mody   - IOP 10,11 today  - currently on cosopt BID OU  8.  PCO OS  - s/p YAG cap OS (04.04.23) - BCVA 20/25 from 20/150   Ophthalmic Meds Ordered this visit:  No orders of the defined types were placed in this encounter.    No  follow-ups on file.  There are no Patient Instructions on file for this visit.  This document serves as a record of services personally performed by Gardiner Sleeper, MD, PhD. It was created on their behalf by Leonie Douglas, an ophthalmic technician. The creation of this record is the provider's dictation and/or activities during the visit.    Electronically signed by: Leonie Douglas COA, 04/03/22  12:08 PM  Gardiner Sleeper, M.D., Ph.D. Diseases & Surgery of the Retina and Vitreous [facility]: M myopia (nearsighted); A astigmatism; H hyperopia (farsighted); P presbyopia; Mrx spectacle prescription;  CTL contact lenses; OD right eye; OS left eye; OU both eyes  XT exotropia; ET esotropia; PEK punctate epithelial keratitis; PEE punctate epithelial erosions; DES dry eye syndrome; MGD meibomian gland dysfunction; ATs artificial tears; PFAT's preservative free artificial tears; Elim nuclear sclerotic cataract; PSC posterior subcapsular cataract; ERM epi-retinal membrane; PVD posterior vitreous detachment; RD retinal detachment; DM diabetes mellitus; DR diabetic retinopathy; NPDR non-proliferative diabetic retinopathy; PDR proliferative diabetic retinopathy; CSME clinically significant macular edema; DME diabetic macular edema; dbh dot blot hemorrhages; CWS cotton wool spot; POAG primary open angle glaucoma; C/D cup-to-disc ratio; HVF humphrey visual field; GVF goldmann visual field; OCT optical coherence tomography; IOP intraocular pressure; BRVO Branch retinal vein occlusion; CRVO central retinal vein occlusion; CRAO central retinal artery occlusion; BRAO branch retinal artery occlusion; RT retinal tear; SB scleral buckle; PPV pars plana vitrectomy; VH Vitreous hemorrhage; PRP panretinal laser photocoagulation; IVK intravitreal kenalog; VMT vitreomacular traction; MH Macular hole;  NVD neovascularization of the disc; NVE neovascularization elsewhere; AREDS age related  eye disease study; ARMD age related macular degeneration; POAG primary open angle glaucoma; EBMD epithelial/anterior basement membrane dystrophy; ACIOL anterior chamber intraocular lens; IOL intraocular lens; PCIOL posterior chamber intraocular lens; Phaco/IOL phacoemulsification with intraocular lens placement; [place] photorefractive keratectomy; LASIK laser assisted in situ keratomileusis; HTN hypertension; DM diabetes mellitus; COPD chronic obstructive pulmonary disease

## 2022-04-06 ENCOUNTER — Ambulatory Visit (INDEPENDENT_AMBULATORY_CARE_PROVIDER_SITE_OTHER): Payer: Medicare Other | Admitting: Ophthalmology

## 2022-04-06 ENCOUNTER — Encounter (INDEPENDENT_AMBULATORY_CARE_PROVIDER_SITE_OTHER): Payer: Self-pay | Admitting: Ophthalmology

## 2022-04-06 DIAGNOSIS — Z961 Presence of intraocular lens: Secondary | ICD-10-CM | POA: Diagnosis not present

## 2022-04-06 DIAGNOSIS — E113213 Type 2 diabetes mellitus with mild nonproliferative diabetic retinopathy with macular edema, bilateral: Secondary | ICD-10-CM

## 2022-04-06 DIAGNOSIS — I10 Essential (primary) hypertension: Secondary | ICD-10-CM | POA: Diagnosis not present

## 2022-04-06 DIAGNOSIS — Z8619 Personal history of other infectious and parasitic diseases: Secondary | ICD-10-CM

## 2022-04-06 DIAGNOSIS — H401130 Primary open-angle glaucoma, bilateral, stage unspecified: Secondary | ICD-10-CM

## 2022-04-06 DIAGNOSIS — H35033 Hypertensive retinopathy, bilateral: Secondary | ICD-10-CM

## 2022-04-06 DIAGNOSIS — H26492 Other secondary cataract, left eye: Secondary | ICD-10-CM

## 2022-04-06 DIAGNOSIS — H18523 Epithelial (juvenile) corneal dystrophy, bilateral: Secondary | ICD-10-CM

## 2022-04-09 ENCOUNTER — Encounter (INDEPENDENT_AMBULATORY_CARE_PROVIDER_SITE_OTHER): Payer: Self-pay | Admitting: Ophthalmology

## 2022-04-09 MED ORDER — AFLIBERCEPT 2MG/0.05ML IZ SOLN FOR KALEIDOSCOPE
2.0000 mg | INTRAVITREAL | Status: AC | PRN
  Administered 2022-04-09: 2 mg via INTRAVITREAL

## 2022-04-26 ENCOUNTER — Other Ambulatory Visit: Payer: Self-pay | Admitting: Nurse Practitioner

## 2022-04-26 DIAGNOSIS — I480 Paroxysmal atrial fibrillation: Secondary | ICD-10-CM

## 2022-04-30 NOTE — Progress Notes (Signed)
[facility] Note  05/04/2022    CHIEF COMPLAINT Patient presents for Retina Follow Up  HISTORY OF PRESENT ILLNESS: Lucas Dudley is a 81 y.o. male who presents to the clinic today for:  HPI     Retina Follow Up   Patient presents with  Diabetic Retinopathy.  In both eyes.  Duration of 4 weeks.  Since onset it is stable.  I, the attending physician,  performed the HPI with the patient and updated documentation appropriately.        Comments   4 1/2 week follow up NPDR OU-  Vision appears stable OU.       Last edited by Bernarda Caffey, MD on 05/06/2022  4:39 AM.     Patient states vision the same OU.  Referring physician: [name], MD [place],  [place] [address]  HISTORICAL INFORMATION:   Selected notes from the MEDICAL RECORD NUMBER Referred by Dr. Kathlen Mody for DEE   CURRENT MEDICATIONS: Current Outpatient Medications (Ophthalmic Drugs)  Medication Sig   COMBIGAN 0.2-0.5 % ophthalmic solution Apply 1 drop to eye 2 (two) times daily.   dorzolamide-timolol (COSOPT) 22.3-6.8 MG/ML ophthalmic solution Place 1 drop into both eyes 2 (two) times daily.   No current facility-administered medications for this visit. (Ophthalmic Drugs)   Current Outpatient Medications (Other)  Medication Sig   amiodarone (PACERONE) 200 MG tablet TAKE 1 TABLET BY MOUTH EVERY DAY   amLODipine (NORVASC) 10 MG tablet Take 1 tablet (10 mg total) by mouth at bedtime.   carvedilol (COREG) 6.25 MG tablet Takes 1 tablet by mouth in the AM and 1 tablet by mouth in the PM   Cholecalciferol (VITAMIN D-3 PO) Take 1,000 mg by mouth daily.   CRANBERRY PO Take 1 tablet by mouth daily.   diphenhydramine-acetaminophen (TYLENOL PM) 25-500 MG TABS tablet Take 2 tablets by mouth at bedtime.   Docusate Calcium (STOOL SOFTENER PO) Take 100 mg by mouth every evening.   ELIQUIS 5 MG TABS tablet TAKE 1 TABLET BY MOUTH TWICE A DAY   furosemide (LASIX) 40 MG tablet TAKE 1  TABLET BY MOUTH TWICE A DAY   gemfibrozil (LOPID) 600 MG tablet Take 600 mg by mouth at bedtime.    glimepiride (AMARYL) 2 MG tablet Take 2 mg by mouth 2 (two) times daily.    Insulin Glargine (BASAGLAR KWIKPEN) 100 UNIT/ML Inject 35 Units into the skin at bedtime. (Patient taking differently: Inject 40 Units into the skin at bedtime.)   KLOR-CON M20 20 MEQ tablet TAKE 1 TABLET BY MOUTH EVERY DAY   lisinopril (ZESTRIL) 40 MG tablet Take 40 mg by mouth daily.   Melatonin 10 MG TABS Take 10 mg by mouth at bedtime.   metFORMIN (GLUCOPHAGE-XR) 500 MG 24 hr tablet Take 1,000 mg by mouth in the morning and at bedtime.   mineral oil liquid Take 15 mLs by mouth daily as needed for moderate constipation.   Multiple Vitamins-Minerals (MULTIVITAMIN WITH MINERALS) tablet Take 1 tablet by mouth daily. Centrum men 50+   naproxen (NAPROSYN) 500 MG tablet Take 1 tablet (500 mg total) by mouth 2 (two) times daily with a meal.   Omega-3 1000 MG CAPS Take 1,000 mg by mouth daily.   OZEMPIC, 1 MG/DOSE, 4 MG/3ML SOPN Inject 1 mg into the skin once a week.   valACYclovir (VALTREX) 1000 MG tablet Take 1,000 mg by mouth at bedtime.   albuterol (VENTOLIN HFA) 108 (90 Base) MCG/ACT  inhaler Inhale 2 puffs into the lungs every 4 (four) hours as needed for wheezing or shortness of breath. (Patient not taking: Reported on 02/26/2022)   No current facility-administered medications for this visit. (Other)   REVIEW OF SYSTEMS: ROS   Positive for: Gastrointestinal, Endocrine, Cardiovascular, Eyes, Respiratory Negative for: Constitutional, Neurological, Skin, Genitourinary, Musculoskeletal, HENT, Psychiatric, Allergic/Imm, Heme/Lymph Last edited by Leonie Douglas, COA on 05/04/2022  1:53 PM.      ALLERGIES Allergies  Allergen Reactions   Azithromycin Nausea Only   Tape Rash and Other (See Comments)    Causes skin redness, Use paper tape only.   PAST MEDICAL HISTORY Past Medical History:  Diagnosis Date   Arthritis     Atrial fibrillation (HCC)    CAD (coronary artery disease)    a. Cath 03/17/15 showing 100% ostial D1, 50% prox LAD to mid LAD, 40% RPDA stenosis. Med rx. // Myoview 01/2020: EF 31 diffuse perfusion defect without reversibility (suspect artifact); reviewed with Dr. Aggie Cosier study felt to be low risk   Cataract    Mixed form OD   Chronic diastolic CHF (congestive heart failure) (HCC)    Diabetic retinopathy ([place])    NPDR OU   Essential hypertension    Glaucoma    POAG OU   History of gout    Hyperlipidemia    Hypertensive retinopathy    OU   Kidney stones    Melanoma of neck (HCC)    NICM (nonischemic cardiomyopathy) ([place])    OSA on CPAP 2012   Prostate cancer ([place])    Type II diabetes mellitus ([place])    Past Surgical History:  Procedure Laterality Date   ABDOMINAL HERNIA REPAIR     w/mesh   ATRIAL FIBRILLATION ABLATION N/A 06/06/2021   Procedure: ATRIAL FIBRILLATION ABLATION;  Surgeon: Thompson Grayer, MD;  Location: [facility];  Service: Cardiovascular;  Laterality: N/A;   CARDIAC CATHETERIZATION N/A 03/17/2015   Procedure: Left Heart Cath and Coronary Angiography;  Surgeon: Lorretta Harp, MD;  Location: [facility];  Service: Cardiovascular;  Laterality: N/A;   CARDIOVERSION N/A 08/09/2021   Procedure: CARDIOVERSION;  Surgeon: [name], MD;  Location: [facility];  Service: Cardiovascular;  Laterality: N/A;   CARDIOVERSION N/A 11/10/2021   Procedure: CARDIOVERSION;  Surgeon: Berniece Salines, DO;  Location: [facility];  Service: Cardiovascular;  Laterality: N/A;   carotid doppler  09/17/2008   rigt and left ICAs 0-49%;mildly  abnormal   CATARACT EXTRACTION Left 2020   Dr. Kathlen Mody   COLONOSCOPY N/A 05/16/2017   Procedure: COLONOSCOPY;  Surgeon: Rogene Houston, MD;  [facility];  Service: Endoscopy;  Laterality: N/A;  [place]  05/25/2009   EF 50-55%,LA mildly dilated, LV function normal   ELECTROPHYSIOLOGIC STUDY N/A  04/05/2015   Procedure: Cardioversion;  Surgeon: Sanda Klein, MD;  Location: [facility];  Service: Cardiovascular;  Laterality: N/A;   ELECTROPHYSIOLOGIC STUDY N/A 09/06/2015   Procedure: Atrial Fibrillation Ablation;  Surgeon: Thompson Grayer, MD;  Location: [facility];  Service: Cardiovascular;  Laterality: N/A;   ELECTROPHYSIOLOGIC STUDY N/A 07/12/2016   [facility] afib ablation by Dr Rayann Heman   EXCISIONAL HEMORRHOIDECTOMY     "inside and out"   EYE SURGERY Left 2020   Cat Sx - Dr. Kathlen Mody   FINE NEEDLE ASPIRATION Right    knee; "drew ~ 1 quart off"   [place] Right    "  neck"   NM MYOCAR PERF WALL MOTION  02/21/2012   EF 61% ,EXERCISE 7 METS. exercise stopped due to wheezing and shortness of breathe   POLYPECTOMY  05/16/2017   Procedure: POLYPECTOMY;  Surgeon: Rogene Houston, MD;  [facility];  Service: Endoscopy;;  colon   PROSTATECTOMY     SHOULDER OPEN ROTATOR CUFF REPAIR Right X 2   TEE WITHOUT CARDIOVERSION N/A 09/05/2015   Procedure: TRANSESOPHAGEAL ECHOCARDIOGRAM (TEE);  Surgeon: Sueanne Margarita, MD;  Location: [facility];  Service: Cardiovascular;  Laterality: N/A;   FAMILY HISTORY Family History  Problem Relation Age of Onset   Heart disease Mother    Lung cancer Mother    Heart attack Mother 18   Stroke Brother    Healthy Daughter    Diabetes Father    Heart disease Father    SOCIAL HISTORY Social History   Tobacco Use   Smoking status: Former    Packs/day: 2.00    Years: 27.00    Total pack years: 54.00    Types: Cigarettes    Quit date: 10/31/1983    Years since quitting: 38.5   Smokeless tobacco: Never   Tobacco comments:    Former smoker 09/27/21  Vaping Use   Vaping Use: Never used  Substance Use Topics   Alcohol use: No    Alcohol/week: 0.0 standard drinks of alcohol    Comment: "used to drink; stopped ~ 2008"   Drug use: No       OPHTHALMIC EXAM: Base Eye Exam      Visual Acuity (Snellen - Linear)       Right Left   Dist cc 20/30- 20/50+   Dist ph cc NI 20/40+    Correction: Glasses         Tonometry (Tonopen, 2:01 PM)       Right Left   Pressure 14 14         Pupils       Dark Light Shape React APD   Right 2 1 Round Brisk None   Left 2 1 Round Brisk None         Visual Fields (Counting fingers)       Left Right    Full Full         Extraocular Movement       Right Left    Full Full         Neuro/Psych     Oriented x3: Yes   Mood/Affect: Normal         Dilation     Both eyes: 1.0% Mydriacyl, 2.5% Phenylephrine @ 2:01 PM           Slit Lamp and Fundus Exam     Slit Lamp Exam       Right Left   Lids/Lashes Dermato, mild MGD Dermato, mild MGD   Conjunctiva/Sclera Temporal pinguecula, mild inferior sub conj heme Temporal pinguecula   Cornea EBMD, mild haze, trace PEE, mild Debris in tear film, well healed cataract wound trace haze, trace PEE, well healed temporal cataract wounds, arcus   Anterior Chamber Deep and clear; narrow temporal angle Deep and clear   Iris Round and dilated, mild anterior bowing, No NVI Round and moderately dilated to 5.56m   Lens PCIOL in good position, trace PCO PC IOL in good position with open PC   Anterior Vitreous Synerisis Synerisis         Fundus Exam       Right Left  Disc Mild pallor, sharp, +cupping, thin inferior rim Mild pallor, sharp rim, +cupping, +PPA   C/D Ratio 0.7 0.6   Macula Flat, blunted foveal reflex, trace central cystic changes - slightly increased, RPE mottling and clumping, no heme Flat, blunted foveal reflex, clusters of fine MA nasal and superior to fovea--persistent, +cystic changes -- persistent, focal punctate exudates superior to fovea   Vessels attenuated, Tortuous Attenuated, mild av crossing changes, mild tortuousity   Periphery Attached, rare MA Attached. No heme.           Refraction     Wearing Rx       Sphere Cylinder  Axis Add   Right -0.25 +2.00 158 +2.50   Left -1.00 +1.25 008 +2.50           IMAGING AND PROCEDURES  Imaging and Procedures for 05/04/2022  OCT, Retina - OU - Both Eyes       Right Eye Quality was good. Central Foveal Thickness: 360. Progression has worsened. Findings include no SRF, abnormal foveal contour, intraretinal fluid, vitreomacular adhesion (Mild interval improvement in IRF superior and temporal fovea and macula ).   Left Eye Quality was good. Central Foveal Thickness: 307. Progression has been stable. Findings include abnormal foveal contour, intraretinal hyper-reflective material, intraretinal fluid (Persistent IRF/cystic changes; partial PVD).   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 CME - cystoid macular edema. PED - pigment epithelial detachment. IRF - intraretinal fluid. SRF - subretinal fluid. EZ - ellipsoid zone. ERM - epiretinal membrane. ORA - outer retinal atrophy. ORT - outer retinal tubulation. SRHM - subretinal hyper-reflective material. IRHM - intraretinal hyper-reflective material     Intravitreal Injection, Pharmacologic Agent - OS - Left Eye       Time Out 05/04/2022. 3:00 PM. Confirmed correct patient, procedure, site, and patient consented.   Anesthesia Topical anesthesia was used. Anesthetic medications included Lidocaine 2%, Proparacaine 0.5%.   Procedure Preparation included 5% betadine to ocular surface, eyelid speculum. A (32g) needle was used.   Injection: 6 mg faricimab-svoa 6 MG/0.05ML   Route: Intravitreal, Site: Left Eye   NDC: [id number], Lot: [id number], Expiration date: 05/15/2023, Waste: 0 mL   Post-op Post injection exam found visual acuity of at least counting fingers. The  patient tolerated the procedure well. There were no complications. The patient received written and verbal post procedure care education. Post injection medications were not given.            ASSESSMENT/PLAN:   ICD-10-CM   1. 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, associated with type 2 diabetes mellitus (HCC)  F64.3329 OCT, Retina - OU - Both Eyes    Intravitreal Injection, Pharmacologic Agent - OS - Left Eye    faricimab-svoa (VABYSMO) '6mg'$ /0.1m intravitreal injection    2. Essential hypertension  I10     3. Hypertensive retinopathy of both eyes  H35.033     4. Pseudophakia of both eyes  Z96.1     5. 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 History of herpes zoster of eye  Z86.19     7. Primary open angle glaucoma of both eyes, unspecified glaucoma stage  H40.1130        1.  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 Single focal MA superior to fovea; OS: Perifoveal Mas w/late leakage - OCT shows diabetic macular edema OU (OS>OD) - s/p IVA OS # 1(07.26.21), #2 (08.24.21), #3 (09.21.21), #4 (10.22.21) -- IVA resistance - s/p IVE OS #1 (11.22.21), #2 (12.21.21), #3 (01.20.22), #4 (02.18.22), #5 (03.18.22), #6 (04.15.22), #7 (05.10.22) -- sample, #8 (06.08.22), #9 (07.12.22), #10 (08.12.22), #11 (09.16.22), #12 (10.21.22), #13 (11.18.22), #14 (12.21.22), #15 (01.20.23), #16 (02.17.23), #17 (03.24.23), #18 (04.21.23), #19 (05.26.23), #20 (06.23.23) - BCVA 20/30 OD (slightly decreased from 20/20); OS 20/40 (slightly decreased from 20/30)  - OCT shows OD: Mild interval increase in IRF superior and temporal fovea and macula; OS: Persistent IRF/cystic changes; partial PVD  - discussed possible resistance to IVE  - recommend switching OS from eylea to vabysmso OS #1 today, 07.21.23  - discussed possible injection OD -- pt wishes to defer for now  - pt wishes to proceed with  IVV OS #1 today  - RBA of procedure discussed, questions answered  - Vabysmo informed consent obtained and signed, 07.21.23 (OS) - see procedure note - IVA informed consent obtained and signed, 07.26.21 (OS) - IVE informed consent obtained and signed, 04.21.23 (OS) - Good Days approved until 10/14/22 - [employer] approved Vabysmo - f/u 4-5 weeks -- DFE/OCT, possible injection  2,3. Hypertensive retinopathy OU - discussed importance of tight BP control - monitor   4. Pseudophakia OU  - s/p CE/IOL OS (2020, Dr. Kathlen Mody)             - s/p CE/IOL OD (2022, Dr. Kathlen Mody)  - s/p YAG cap OS (04.04.23) - BCVA 20/25 from 20/150  - IOLs in good position, doing well  - monitor   5. 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 Susa Simmonds in August 2022  - s/p SuperK OS on September 25, 2021  6. History of herpes zoster/iritis OS  - on valtrex 1g daily---maintenance   7. POAG OU  - was under the expert management of Dr. Kathlen Mody   - IOP 14,14 today  - currently on Cosopt BID OU  Ophthalmic Meds Ordered this visit:  Meds ordered this encounter  Medications   faricimab-svoa (VABYSMO) '6mg'$ /0.79m intravitreal injection     Return 4-5 weeks, for DFE, OCT.  There are no Patient Instructions on file for this visit.  This document serves as a record of services personally performed by BGardiner Sleeper MD, PhD. It was created on their behalf by RLeonie Douglas an ophthalmic technician. The creation of this record is the provider's dictation and/or activities during the visit.    Electronically signed by: RLeonie DouglasCOA, 05/06/22  4:41 AM  This document serves as a record of services personally performed by BGardiner Sleeper MD, PhD. It was created on their behalf by DRoselee Nova COMT. The creation of this record is the provider's dictation and/or activities during the visit.  Electronically signed by: DRoselee Nova COMT 05/06/22 4:41 AM  BGardiner Sleeper M.D., Ph.D. Diseases & Surgery of the Retina  and Vitreous [facility] I have reviewed the above documentation for accuracy and completeness, and I agree with the above. BGardiner Sleeper M.D., Ph.D. 05/06/22 4:44 AM   Abbreviations: M myopia (nearsighted); A astigmatism; H hyperopia (farsighted); P presbyopia; Mrx spectacle prescription;  CTL contact lenses; OD right eye; OS left eye; OU both eyes  XT exotropia; ET esotropia; PEK punctate epithelial  keratitis; PEE punctate epithelial erosions; DES dry eye syndrome; MGD meibomian gland dysfunction; ATs artificial tears; PFAT's preservative free artificial tears; [place] nuclear sclerotic cataract; PSC posterior subcapsular cataract; ERM epi-retinal membrane; PVD posterior vitreous detachment; RD retinal detachment; DM diabetes mellitus; DR diabetic retinopathy; NPDR non-proliferative diabetic retinopathy; PDR proliferative diabetic retinopathy; CSME clinically significant macular edema; DME diabetic macular edema; dbh dot blot hemorrhages; CWS cotton wool spot; POAG primary open angle glaucoma; C/D cup-to-disc ratio; HVF humphrey visual field; GVF goldmann visual field; OCT optical coherence tomography; IOP intraocular pressure; BRVO Branch retinal vein occlusion; CRVO central retinal vein occlusion; CRAO central retinal artery occlusion; BRAO branch retinal artery occlusion; RT retinal tear; SB scleral buckle; PPV pars plana vitrectomy; VH Vitreous hemorrhage; PRP panretinal laser photocoagulation; IVK intravitreal kenalog; VMT vitreomacular traction; MH Macular hole;  NVD neovascularization of the disc; NVE neovascularization elsewhere; AREDS age related eye disease study; ARMD age related macular degeneration; POAG primary open angle glaucoma; EBMD epithelial/anterior basement membrane dystrophy; ACIOL anterior chamber intraocular lens; IOL intraocular lens; PCIOL posterior chamber intraocular lens; Phaco/IOL phacoemulsification with intraocular lens placement; [place] photorefractive  keratectomy; LASIK laser assisted in situ keratomileusis; HTN hypertension; DM diabetes mellitus; COPD chronic obstructive pulmonary disease

## 2022-05-04 ENCOUNTER — Encounter (INDEPENDENT_AMBULATORY_CARE_PROVIDER_SITE_OTHER): Payer: Self-pay | Admitting: *Deleted

## 2022-05-04 ENCOUNTER — Encounter (INDEPENDENT_AMBULATORY_CARE_PROVIDER_SITE_OTHER): Payer: Self-pay | Admitting: Ophthalmology

## 2022-05-04 ENCOUNTER — Ambulatory Visit (INDEPENDENT_AMBULATORY_CARE_PROVIDER_SITE_OTHER): Payer: Medicare Other | Admitting: Ophthalmology

## 2022-05-04 DIAGNOSIS — E113213 Type 2 diabetes mellitus with mild nonproliferative diabetic retinopathy with macular edema, bilateral: Secondary | ICD-10-CM | POA: Diagnosis not present

## 2022-05-04 DIAGNOSIS — H35033 Hypertensive retinopathy, bilateral: Secondary | ICD-10-CM | POA: Diagnosis not present

## 2022-05-04 DIAGNOSIS — I10 Essential (primary) hypertension: Secondary | ICD-10-CM | POA: Diagnosis not present

## 2022-05-04 DIAGNOSIS — Z961 Presence of intraocular lens: Secondary | ICD-10-CM

## 2022-05-04 DIAGNOSIS — H401130 Primary open-angle glaucoma, bilateral, stage unspecified: Secondary | ICD-10-CM

## 2022-05-04 DIAGNOSIS — Z8619 Personal history of other infectious and parasitic diseases: Secondary | ICD-10-CM

## 2022-05-04 DIAGNOSIS — H18523 Epithelial (juvenile) corneal dystrophy, bilateral: Secondary | ICD-10-CM

## 2022-05-06 ENCOUNTER — Encounter (INDEPENDENT_AMBULATORY_CARE_PROVIDER_SITE_OTHER): Payer: Self-pay | Admitting: Ophthalmology

## 2022-05-06 MED ORDER — FARICIMAB-SVOA 6 MG/0.05ML IZ SOLN
6.0000 mg | INTRAVITREAL | Status: AC | PRN
  Administered 2022-05-06: 6 mg via INTRAVITREAL

## 2022-05-18 ENCOUNTER — Telehealth (HOSPITAL_COMMUNITY): Payer: Self-pay | Admitting: *Deleted

## 2022-05-18 NOTE — Telephone Encounter (Signed)
Patient's daughter called in stating pt thinks he is back in AF - short of breath, fatigue and feels as though he is holding onto fluid. Discussed with Adline Peals PA pt can take extra '40mg'$  of lasix this morning. Appt made for Monday.

## 2022-05-21 ENCOUNTER — Other Ambulatory Visit: Payer: Self-pay

## 2022-05-21 ENCOUNTER — Ambulatory Visit (HOSPITAL_BASED_OUTPATIENT_CLINIC_OR_DEPARTMENT_OTHER)
Admission: RE | Admit: 2022-05-21 | Discharge: 2022-05-21 | Disposition: A | Discharge status: Home / Self Care | Payer: Medicare Other | Source: Ambulatory Visit | Attending: Nurse Practitioner | Admitting: Nurse Practitioner

## 2022-05-21 ENCOUNTER — Ambulatory Visit (HOSPITAL_COMMUNITY)
Admission: RE | Admit: 2022-05-21 | Discharge: 2022-05-21 | Disposition: A | Discharge status: Home / Self Care | Payer: Medicare Other | Source: Ambulatory Visit | Attending: Nurse Practitioner | Admitting: Nurse Practitioner

## 2022-05-21 ENCOUNTER — Encounter (HOSPITAL_COMMUNITY): Payer: Self-pay | Admitting: Nurse Practitioner

## 2022-05-21 ENCOUNTER — Encounter (HOSPITAL_COMMUNITY): Payer: Self-pay

## 2022-05-21 ENCOUNTER — Inpatient Hospital Stay (HOSPITAL_COMMUNITY)
Admission: EM | Admit: 2022-05-21 | Discharge: 2022-06-01 | DRG: 377 | Disposition: A | Discharge status: Home Health | Payer: Medicare Other | Attending: Internal Medicine | Admitting: Internal Medicine

## 2022-05-21 ENCOUNTER — Emergency Department (HOSPITAL_COMMUNITY): Payer: Medicare Other

## 2022-05-21 VITALS — BP 124/56 | HR 63 | Ht 69.0 in | Wt 247.6 lb

## 2022-05-21 DIAGNOSIS — Z8249 Family history of ischemic heart disease and other diseases of the circulatory system: Secondary | ICD-10-CM

## 2022-05-21 DIAGNOSIS — Z79899 Other long term (current) drug therapy: Secondary | ICD-10-CM | POA: Insufficient documentation

## 2022-05-21 DIAGNOSIS — I5042 Chronic combined systolic (congestive) and diastolic (congestive) heart failure: Secondary | ICD-10-CM | POA: Insufficient documentation

## 2022-05-21 DIAGNOSIS — Z7901 Long term (current) use of anticoagulants: Secondary | ICD-10-CM | POA: Insufficient documentation

## 2022-05-21 DIAGNOSIS — I4819 Other persistent atrial fibrillation: Secondary | ICD-10-CM | POA: Insufficient documentation

## 2022-05-21 DIAGNOSIS — E1169 Type 2 diabetes mellitus with other specified complication: Secondary | ICD-10-CM

## 2022-05-21 DIAGNOSIS — Z8546 Personal history of malignant neoplasm of prostate: Secondary | ICD-10-CM

## 2022-05-21 DIAGNOSIS — D6832 Hemorrhagic disorder due to extrinsic circulating anticoagulants: Secondary | ICD-10-CM | POA: Diagnosis present

## 2022-05-21 DIAGNOSIS — E119 Type 2 diabetes mellitus without complications: Secondary | ICD-10-CM

## 2022-05-21 DIAGNOSIS — T45515A Adverse effect of anticoagulants, initial encounter: Secondary | ICD-10-CM | POA: Diagnosis present

## 2022-05-21 DIAGNOSIS — D6869 Other thrombophilia: Secondary | ICD-10-CM | POA: Diagnosis not present

## 2022-05-21 DIAGNOSIS — G4733 Obstructive sleep apnea (adult) (pediatric): Secondary | ICD-10-CM | POA: Insufficient documentation

## 2022-05-21 DIAGNOSIS — D62 Acute posthemorrhagic anemia: Secondary | ICD-10-CM | POA: Diagnosis present

## 2022-05-21 DIAGNOSIS — Z794 Long term (current) use of insulin: Secondary | ICD-10-CM

## 2022-05-21 DIAGNOSIS — Z823 Family history of stroke: Secondary | ICD-10-CM

## 2022-05-21 DIAGNOSIS — I110 Hypertensive heart disease with heart failure: Secondary | ICD-10-CM | POA: Diagnosis present

## 2022-05-21 DIAGNOSIS — J189 Pneumonia, unspecified organism: Secondary | ICD-10-CM

## 2022-05-21 DIAGNOSIS — I4891 Unspecified atrial fibrillation: Secondary | ICD-10-CM | POA: Diagnosis present

## 2022-05-21 DIAGNOSIS — K5521 Angiodysplasia of colon with hemorrhage: Secondary | ICD-10-CM | POA: Diagnosis not present

## 2022-05-21 DIAGNOSIS — K635 Polyp of colon: Secondary | ICD-10-CM | POA: Diagnosis present

## 2022-05-21 DIAGNOSIS — K5900 Constipation, unspecified: Secondary | ICD-10-CM | POA: Diagnosis present

## 2022-05-21 DIAGNOSIS — E876 Hypokalemia: Secondary | ICD-10-CM | POA: Diagnosis present

## 2022-05-21 DIAGNOSIS — I5043 Acute on chronic combined systolic (congestive) and diastolic (congestive) heart failure: Secondary | ICD-10-CM | POA: Diagnosis present

## 2022-05-21 DIAGNOSIS — K449 Diaphragmatic hernia without obstruction or gangrene: Secondary | ICD-10-CM | POA: Diagnosis present

## 2022-05-21 DIAGNOSIS — Z6836 Body mass index (BMI) 36.0-36.9, adult: Secondary | ICD-10-CM

## 2022-05-21 DIAGNOSIS — I10 Essential (primary) hypertension: Secondary | ICD-10-CM | POA: Diagnosis present

## 2022-05-21 DIAGNOSIS — K5520 Angiodysplasia of colon without hemorrhage: Secondary | ICD-10-CM

## 2022-05-21 DIAGNOSIS — Z7984 Long term (current) use of oral hypoglycemic drugs: Secondary | ICD-10-CM

## 2022-05-21 DIAGNOSIS — Z87891 Personal history of nicotine dependence: Secondary | ICD-10-CM

## 2022-05-21 DIAGNOSIS — I2510 Atherosclerotic heart disease of native coronary artery without angina pectoris: Secondary | ICD-10-CM | POA: Diagnosis present

## 2022-05-21 DIAGNOSIS — E66812 Obesity, class 2: Secondary | ICD-10-CM | POA: Diagnosis present

## 2022-05-21 DIAGNOSIS — D649 Anemia, unspecified: Principal | ICD-10-CM

## 2022-05-21 DIAGNOSIS — Z9049 Acquired absence of other specified parts of digestive tract: Secondary | ICD-10-CM | POA: Insufficient documentation

## 2022-05-21 DIAGNOSIS — Z85820 Personal history of malignant melanoma of skin: Secondary | ICD-10-CM

## 2022-05-21 DIAGNOSIS — I519 Heart disease, unspecified: Secondary | ICD-10-CM | POA: Diagnosis present

## 2022-05-21 DIAGNOSIS — E1165 Type 2 diabetes mellitus with hyperglycemia: Secondary | ICD-10-CM | POA: Diagnosis present

## 2022-05-21 DIAGNOSIS — Z801 Family history of malignant neoplasm of trachea, bronchus and lung: Secondary | ICD-10-CM

## 2022-05-21 DIAGNOSIS — I428 Other cardiomyopathies: Secondary | ICD-10-CM | POA: Diagnosis present

## 2022-05-21 DIAGNOSIS — I480 Paroxysmal atrial fibrillation: Secondary | ICD-10-CM

## 2022-05-21 DIAGNOSIS — E785 Hyperlipidemia, unspecified: Secondary | ICD-10-CM | POA: Diagnosis present

## 2022-05-21 DIAGNOSIS — Z833 Family history of diabetes mellitus: Secondary | ICD-10-CM

## 2022-05-21 DIAGNOSIS — J9601 Acute respiratory failure with hypoxia: Secondary | ICD-10-CM | POA: Diagnosis present

## 2022-05-21 DIAGNOSIS — K5730 Diverticulosis of large intestine without perforation or abscess without bleeding: Secondary | ICD-10-CM | POA: Diagnosis present

## 2022-05-21 DIAGNOSIS — R0602 Shortness of breath: Secondary | ICD-10-CM | POA: Diagnosis not present

## 2022-05-21 LAB — BASIC METABOLIC PANEL
Anion gap: 12 (ref 5–15)
BUN: 25 mg/dL — ABNORMAL HIGH (ref 8–23)
CO2: 28 mmol/L (ref 22–32)
Calcium: 8.1 mg/dL — ABNORMAL LOW (ref 8.9–10.3)
Chloride: 99 mmol/L (ref 98–111)
Creatinine, Ser: 1.47 mg/dL — ABNORMAL HIGH (ref 0.61–1.24)
GFR, Estimated: 48 mL/min — ABNORMAL LOW (ref >60)
Glucose, Bld: 185 mg/dL — ABNORMAL HIGH (ref 70–99)
Potassium: 4.2 mmol/L (ref 3.5–5.1)
Sodium: 139 mmol/L (ref 135–145)

## 2022-05-21 LAB — CBC
HCT: 26 % — ABNORMAL LOW (ref 39.0–52.0)
HCT: 24.3 % — ABNORMAL LOW (ref 39.0–52.0)
Hemoglobin: 7.6 g/dL — ABNORMAL LOW (ref 13.0–17.0)
Hemoglobin: 7.8 g/dL — ABNORMAL LOW (ref 13.0–17.0)
MCH: 24.8 pg — ABNORMAL LOW (ref 26.0–34.0)
MCH: 26.8 pg (ref 26.0–34.0)
MCHC: 29.2 g/dL — ABNORMAL LOW (ref 30.0–36.0)
MCHC: 32.1 g/dL (ref 30.0–36.0)
MCV: 85 fL (ref 80.0–100.0)
MCV: 83.5 fL (ref 80.0–100.0)
Platelets: 305 10*3/uL (ref 150–400)
Platelets: 331 10*3/uL (ref 150–400)
RBC: 3.06 MIL/uL — ABNORMAL LOW (ref 4.22–5.81)
RBC: 2.91 MIL/uL — ABNORMAL LOW (ref 4.22–5.81)
RDW: 15.8 % — ABNORMAL HIGH (ref 11.5–15.5)
RDW: 16 % — ABNORMAL HIGH (ref 11.5–15.5)
WBC: 10 10*3/uL (ref 4.0–10.5)
WBC: 10.5 10*3/uL (ref 4.0–10.5)
nRBC: 0.3 % — ABNORMAL HIGH (ref 0.0–0.2)
nRBC: 0.3 % — ABNORMAL HIGH (ref 0.0–0.2)

## 2022-05-21 LAB — COMPREHENSIVE METABOLIC PANEL
ALT: 14 U/L (ref 0–44)
AST: 17 U/L (ref 15–41)
Albumin: 3.4 g/dL — ABNORMAL LOW (ref 3.5–5.0)
Alkaline Phosphatase: 64 U/L (ref 38–126)
Anion gap: 13 (ref 5–15)
BUN: 20 mg/dL (ref 8–23)
CO2: 25 mmol/L (ref 22–32)
Calcium: 8.3 mg/dL — ABNORMAL LOW (ref 8.9–10.3)
Chloride: 99 mmol/L (ref 98–111)
Creatinine, Ser: 1.21 mg/dL (ref 0.61–1.24)
GFR, Estimated: >60 mL/min (ref >60)
Glucose, Bld: 138 mg/dL — ABNORMAL HIGH (ref 70–99)
Potassium: 4.1 mmol/L (ref 3.5–5.1)
Sodium: 137 mmol/L (ref 135–145)
Total Bilirubin: 0.7 mg/dL (ref 0.3–1.2)
Total Protein: 6.9 g/dL (ref 6.5–8.1)

## 2022-05-21 LAB — BRAIN NATRIURETIC PEPTIDE: B Natriuretic Peptide: 166.2 pg/mL — ABNORMAL HIGH (ref 0.0–100.0)

## 2022-05-21 LAB — ABO/RH: ABO/RH(D): A POS

## 2022-05-21 LAB — POC OCCULT BLOOD, ED: Fecal Occult Bld: POSITIVE — AB

## 2022-05-21 LAB — HEMOGLOBIN A1C
Hgb A1c MFr Bld: 8 % — ABNORMAL HIGH (ref 4.8–5.6)
Mean Plasma Glucose: 182.9 mg/dL

## 2022-05-21 LAB — GLUCOSE, CAPILLARY: Glucose-Capillary: 181 mg/dL — ABNORMAL HIGH (ref 70–99)

## 2022-05-21 LAB — PREPARE RBC (CROSSMATCH)

## 2022-05-21 LAB — TSH: TSH: 3.037 u[IU]/mL (ref 0.350–4.500)

## 2022-05-21 MED ORDER — ONDANSETRON HCL 4 MG/2ML IJ SOLN: 4.0000 mg | Freq: Four times a day (QID) | INTRAMUSCULAR | Status: DC | PRN

## 2022-05-21 MED ORDER — FUROSEMIDE 10 MG/ML IJ SOLN
40.0000 mg | Freq: Once | INTRAMUSCULAR | Status: AC
  Administered 2022-05-21: 40 mg via INTRAVENOUS
  Filled 2022-05-21: qty 4

## 2022-05-21 MED ORDER — MELATONIN 3 MG PO TABS
6.0000 mg | ORAL_TABLET | Freq: Every day | ORAL | Status: DC
  Administered 2022-05-21 – 2022-05-31 (×11): 6 mg via ORAL
  Filled 2022-05-21 (×12): qty 2

## 2022-05-21 MED ORDER — SODIUM CHLORIDE 0.9 % IV SOLN: 10.0000 mL/h | Freq: Once | INTRAVENOUS | Status: DC

## 2022-05-21 MED ORDER — SODIUM CHLORIDE 0.9 % IV SOLN
500.0000 mg | INTRAVENOUS | Status: DC
  Filled 2022-05-21: qty 5

## 2022-05-21 MED ORDER — ACETAMINOPHEN 325 MG PO TABS
650.0000 mg | ORAL_TABLET | Freq: Four times a day (QID) | ORAL | Status: DC | PRN
  Administered 2022-05-21 – 2022-05-25 (×2): 650 mg via ORAL
  Filled 2022-05-21 (×2): qty 2

## 2022-05-21 MED ORDER — SODIUM CHLORIDE 0.9 % IV SOLN
2.0000 g | INTRAVENOUS | Status: AC
  Administered 2022-05-21 – 2022-05-25 (×5): 2 g via INTRAVENOUS
  Filled 2022-05-21 (×5): qty 20

## 2022-05-21 MED ORDER — AMLODIPINE BESYLATE 5 MG PO TABS
10.0000 mg | ORAL_TABLET | Freq: Every day | ORAL | Status: DC
  Administered 2022-05-21 – 2022-05-27 (×7): 10 mg via ORAL
  Filled 2022-05-21 (×7): qty 2

## 2022-05-21 MED ORDER — MELATONIN 3 MG PO TABS: 6.0000 mg | ORAL_TABLET | Freq: Every day | ORAL | Status: DC

## 2022-05-21 MED ORDER — POLYETHYLENE GLYCOL 3350 17 G PO PACK
17.0000 g | PACK | Freq: Two times a day (BID) | ORAL | Status: AC
  Administered 2022-05-21 – 2022-05-22 (×2): 17 g via ORAL
  Filled 2022-05-21 (×3): qty 1

## 2022-05-21 MED ORDER — ONDANSETRON HCL 4 MG PO TABS: 4.0000 mg | ORAL_TABLET | Freq: Four times a day (QID) | ORAL | Status: DC | PRN

## 2022-05-21 MED ORDER — FUROSEMIDE 40 MG PO TABS
40.0000 mg | ORAL_TABLET | Freq: Two times a day (BID) | ORAL | Status: DC
  Administered 2022-05-22 (×2): 40 mg via ORAL
  Filled 2022-05-21 (×2): qty 1

## 2022-05-21 MED ORDER — INSULIN ASPART 100 UNIT/ML IJ SOLN
0.0000 [IU] | Freq: Four times a day (QID) | INTRAMUSCULAR | Status: DC
  Administered 2022-05-21 – 2022-05-23 (×2): 3 [IU] via SUBCUTANEOUS
  Administered 2022-05-24: 5 [IU] via SUBCUTANEOUS
  Administered 2022-05-25: 3 [IU] via SUBCUTANEOUS

## 2022-05-21 MED ORDER — MELATONIN 5 MG PO TABS
10.0000 mg | ORAL_TABLET | Freq: Every day | ORAL | Status: DC
  Filled 2022-05-21 (×2): qty 2

## 2022-05-21 MED ORDER — AMIODARONE HCL 200 MG PO TABS
200.0000 mg | ORAL_TABLET | Freq: Every day | ORAL | Status: DC
  Administered 2022-05-24 – 2022-06-01 (×9): 200 mg via ORAL
  Filled 2022-05-21 (×10): qty 1

## 2022-05-21 MED ORDER — GEMFIBROZIL 600 MG PO TABS
600.0000 mg | ORAL_TABLET | Freq: Every day | ORAL | Status: DC
  Administered 2022-05-21 – 2022-05-31 (×11): 600 mg via ORAL
  Filled 2022-05-21 (×11): qty 1

## 2022-05-21 MED ORDER — GUAIFENESIN-DM 100-10 MG/5ML PO SYRP
10.0000 mL | ORAL_SOLUTION | Freq: Four times a day (QID) | ORAL | Status: DC | PRN
Start: 2022-05-21 — End: 2022-06-01
  Administered 2022-05-22 – 2022-05-26 (×3): 10 mL via ORAL
  Filled 2022-05-21 (×4): qty 10

## 2022-05-21 MED ORDER — ACETAMINOPHEN 650 MG RE SUPP: 650.0000 mg | Freq: Four times a day (QID) | RECTAL | Status: DC | PRN

## 2022-05-21 MED ORDER — PANTOPRAZOLE SODIUM 40 MG IV SOLR
40.0000 mg | Freq: Two times a day (BID) | INTRAVENOUS | Status: DC
  Administered 2022-05-21 – 2022-06-01 (×22): 40 mg via INTRAVENOUS
  Filled 2022-05-21 (×22): qty 10

## 2022-05-21 MED ORDER — VALACYCLOVIR HCL 500 MG PO TABS
1000.0000 mg | ORAL_TABLET | Freq: Every day | ORAL | Status: DC
Start: 2022-05-21 — End: 2022-06-01
  Administered 2022-05-21 – 2022-05-31 (×11): 1000 mg via ORAL
  Filled 2022-05-21 (×11): qty 2

## 2022-05-21 MED ORDER — BRIMONIDINE TARTRATE-TIMOLOL 0.2-0.5 % OP SOLN
1.0000 [drp] | Freq: Two times a day (BID) | OPHTHALMIC | Status: DC
  Filled 2022-05-21: qty 5

## 2022-05-21 MED ORDER — SODIUM CHLORIDE 0.9 % IV SOLN
100.0000 mg | Freq: Two times a day (BID) | INTRAVENOUS | Status: DC
  Administered 2022-05-21 – 2022-05-26 (×9): 100 mg via INTRAVENOUS
  Filled 2022-05-21 (×16): qty 100

## 2022-05-21 MED ORDER — LISINOPRIL 10 MG PO TABS
40.0000 mg | ORAL_TABLET | Freq: Every day | ORAL | Status: DC
  Administered 2022-05-22 – 2022-05-26 (×4): 40 mg via ORAL
  Filled 2022-05-21 (×4): qty 4

## 2022-05-21 MED ORDER — CARVEDILOL 3.125 MG PO TABS
6.2500 mg | ORAL_TABLET | Freq: Two times a day (BID) | ORAL | Status: DC
  Administered 2022-05-22 – 2022-06-01 (×18): 6.25 mg via ORAL
  Filled 2022-05-21 (×20): qty 2

## 2022-05-21 NOTE — Assessment & Plan Note (Signed)
Stable. -Resume Norvasc, carvedilol, Lasix, lisinopril 40 daily,

## 2022-05-21 NOTE — Assessment & Plan Note (Signed)
Reports history of hypoglycemia, recent insulin dose adjustments.  He is also on metformin, weekly Ozempic, and glimepiride and Lantus 30 units daily. - SSI- M q6h -Hold metformin Ozempic and glimepiride and Lantus - HgbA1c

## 2022-05-21 NOTE — Assessment & Plan Note (Addendum)
Stable and compensated.  BNP stable at 166.  Last echo 07/2021, EF of 60 to 65% with indeterminate LV diastolic parameters. -IV Lasix 40 mg x 1 with transfusion, then resume 40 twice daily -Resume carvedilol

## 2022-05-21 NOTE — Assessment & Plan Note (Addendum)
Symptomatic with productive cough, dyspnea which may be secondary to pneumonia versus anemia.  Rules out for sepsis.  Afebrile.  WBC 10.5.  With acute respiratory failure O2 sats 89% on room air.  Chest x-ray shows right base opacity atelectasis versus pneumonia, with small right pleural effusion. -IV ceftriaxone and doxycycline ( family declined azithromycin - nausea allergy). -Mucolytic's as needed

## 2022-05-21 NOTE — Progress Notes (Signed)
Patient declined CPAP because we do not have pillows type mask for him to use. Patient's family member to bring his mask from home tomorrow. Told patient we could provide machine. Patient just wants to wear nasal cannula tonight, but told him to call if he changed his mind.

## 2022-05-21 NOTE — ED Notes (Signed)
Pt taken to bathroom via wheelchair and attempted to have a BM; pt unsuccessful and EDP made aware

## 2022-05-21 NOTE — H&P (Addendum)
History and Physical    CLYDE ZARRELLA UUV:[id number] DOB: April 11, 1941 DOA: 05/21/2022  PCP: [name], MD   Patient coming from: Home  I have personally briefly reviewed patient's old medical records in [facility]  Chief Complaint: Abnormal Labs  HPI: Lucas Dudley is a 81 y.o. male with medical history significant for atrial fibrillation on chronic anticoagulation, systolic CHF, hypertension, diabetes. Patient went to see his cardiologist today, had blood work drawn, which showed hemoglobin of 7.6, was referred to the ED.  He denies abdominal pain, no vomiting.  He does not look at his stool she is unsure if he has had any melena.  He is on Eliquis, and his last dose was this morning.  Reports fatigue over the past few weeks, and difficulty breathing. Also reports a cough productive of brownish sputum over the past few days, congestion.  No fevers or chills.  ED Course: Temp 97.9.  Heart rate 60s.  Respiratory 14-18.  Blood pressure 126-134.  O2 sats 89% on room air, placed on 2 L sats now 96%. Hemoglobin 7.8.  Chest x-ray done earlier today by cardiology showed small right pleural effusion with streaky right base opacity which may represent atelectasis or pneumonia. GI consulted.  1 units PRBC ordered.   Review of Systems: As per HPI all other systems reviewed and negative.  Past Medical History:  Diagnosis Date   Arthritis    Atrial fibrillation (HCC)    CAD (coronary artery disease)    a. Cath 03/17/15 showing 100% ostial D1, 50% prox LAD to mid LAD, 40% RPDA stenosis. Med rx. // Myoview 01/2020: EF 31 diffuse perfusion defect without reversibility (suspect artifact); reviewed with Dr. Aggie Cosier study felt to be low risk   Cataract    Mixed form OD   Chronic diastolic CHF (congestive heart failure) (HCC)    Diabetic retinopathy ([place])    NPDR OU   Essential hypertension    Glaucoma    POAG OU   History of gout    Hyperlipidemia    Hypertensive retinopathy    OU    Kidney stones    Melanoma of neck (HCC)    NICM (nonischemic cardiomyopathy) ([place])    OSA on CPAP 2012   Prostate cancer ([place])    Type II diabetes mellitus ([place])     Past Surgical History:  Procedure Laterality Date   ABDOMINAL HERNIA REPAIR     w/mesh   ATRIAL FIBRILLATION ABLATION N/A 06/06/2021   Procedure: ATRIAL FIBRILLATION ABLATION;  Surgeon: Thompson Grayer, MD;  Location: [facility];  Service: Cardiovascular;  Laterality: N/A;   CARDIAC CATHETERIZATION N/A 03/17/2015   Procedure: Left Heart Cath and Coronary Angiography;  Surgeon: Lorretta Harp, MD;  Location: [facility];  Service: Cardiovascular;  Laterality: N/A;   CARDIOVERSION N/A 08/09/2021   Procedure: CARDIOVERSION;  Surgeon: [name], MD;  Location: [facility];  Service: Cardiovascular;  Laterality: N/A;   CARDIOVERSION N/A 11/10/2021   Procedure: CARDIOVERSION;  Surgeon: Berniece Salines, DO;  Location: [facility];  Service: Cardiovascular;  Laterality: N/A;   carotid doppler  09/17/2008   rigt and left ICAs 0-49%;mildly  abnormal   CATARACT EXTRACTION Left 2020   Dr. Kathlen Mody   COLONOSCOPY N/A 05/16/2017   Procedure: COLONOSCOPY;  Surgeon: Rogene Houston, MD;  [facility];  Service: Endoscopy;  Laterality: N/A;  [place]  05/25/2009   EF 50-55%,LA mildly dilated, LV function normal   ELECTROPHYSIOLOGIC  STUDY N/A 04/05/2015   Procedure: Cardioversion;  Surgeon: Sanda Klein, MD;  Location: [facility];  Service: Cardiovascular;  Laterality: N/A;   ELECTROPHYSIOLOGIC STUDY N/A 09/06/2015   Procedure: Atrial Fibrillation Ablation;  Surgeon: Thompson Grayer, MD;  Location: [facility];  Service: Cardiovascular;  Laterality: N/A;   ELECTROPHYSIOLOGIC STUDY N/A 07/12/2016   [facility] afib ablation by Dr Rayann Heman   EXCISIONAL HEMORRHOIDECTOMY     "inside and out"   EYE SURGERY Left 2020   Cat Sx - Dr. Kathlen Mody   FINE NEEDLE ASPIRATION Right    knee; "drew ~  1 quart off"   [place] Right    "neck"   NM MYOCAR PERF WALL MOTION  02/21/2012   EF 61% ,EXERCISE 7 METS. exercise stopped due to wheezing and shortness of breathe   POLYPECTOMY  05/16/2017   Procedure: POLYPECTOMY;  Surgeon: Rogene Houston, MD;  [facility];  Service: Endoscopy;;  colon   PROSTATECTOMY     SHOULDER OPEN ROTATOR CUFF REPAIR Right X 2   TEE WITHOUT CARDIOVERSION N/A 09/05/2015   Procedure: TRANSESOPHAGEAL ECHOCARDIOGRAM (TEE);  Surgeon: Sueanne Margarita, MD;  Location[facility];  Service: Cardiovascular;  Laterality: N/A;     reports that he quit smoking about 38 years ago. His smoking use included cigarettes. He has a 54.00 pack-year smoking history. He has never used smokeless tobacco. He reports that he does not drink alcohol and does not use drugs.  Allergies  Allergen Reactions   Azithromycin Nausea Only   Tape Rash and Other (See Comments)    Causes skin redness, Use paper tape only.    Family History  Problem Relation Age of Onset   Heart disease Mother    Lung cancer Mother    Heart attack Mother 35   Stroke Brother    Healthy Daughter    Diabetes Father    Heart disease Father    Prior to Admission medications   Medication Sig Start Date End Date Taking? Authorizing Provider  albuterol (VENTOLIN HFA) 108 (90 Base) MCG/ACT inhaler Inhale 2 puffs into the lungs every 4 (four) hours as needed for wheezing or shortness of breath. 10/27/21  Yes [provider]  amiodarone (PACERONE) 200 MG tablet TAKE 1 TABLET BY MOUTH EVERY DAY 12/21/21  Yes Sherran Needs, NP  amLODipine (NORVASC) 10 MG tablet Take 1 tablet (10 mg total) by mouth at bedtime. 09/27/21  Yes Allred, Jeneen Rinks, MD  carvedilol (COREG) 6.25 MG tablet Takes 1 tablet by mouth in the AM and 1 tablet by mouth in the PM 07/04/21  Yes Sherran Needs, NP  Cholecalciferol (VITAMIN D-3 PO) Take 1,000 mg by mouth daily.    Yes [provider]  COMBIGAN 0.2-0.5 % ophthalmic solution Apply 1 drop to eye 2 (two) times daily. 10/16/21  Yes [provider]  CRANBERRY PO Take 1 tablet by mouth daily.   Yes [provider]  diphenhydramine-acetaminophen (TYLENOL PM) 25-500 MG TABS tablet Take 2 tablets by mouth at bedtime.   Yes [provider]  Docusate Calcium (STOOL SOFTENER PO) Take 200 mg by mouth daily.   Yes [provider]  ELIQUIS 5 MG TABS tablet TAKE 1 TABLET BY MOUTH TWICE A DAY 04/26/22  Yes Sherran Needs, NP  furosemide (LASIX) 40 MG tablet TAKE 1 TABLET BY MOUTH TWICE A DAY 10/11/21  Yes Sherran Needs, NP  gemfibrozil (LOPID) 600 MG tablet Take 600 mg by mouth at bedtime.    Yes [provider]  glimepiride (AMARYL) 2 MG tablet Take 2 mg by mouth daily with lunch.   Yes [provider]  Insulin Glargine (BASAGLAR KWIKPEN) 100 UNIT/ML Inject 35 Units into the skin at bedtime. Patient taking differently: Inject 30 Units into the skin at bedtime. 08/15/21  Yes Kathie Dike, MD  KLOR-CON M20 20 MEQ tablet TAKE 1 TABLET BY MOUTH EVERY DAY 12/21/21  Yes Sherran Needs, NP  lisinopril (ZESTRIL) 40 MG tablet Take 40 mg by mouth daily. 11/22/20  Yes [provider]  Melatonin 10 MG TABS Take 10 mg by mouth at bedtime.   Yes [provider]  metFORMIN (GLUCOPHAGE-XR) 500 MG 24 hr tablet Take 1,000 mg by mouth in the morning and at bedtime. 11/11/19  Yes [provider]  mineral oil liquid Take 15 mLs by mouth daily as needed for moderate constipation.   Yes [provider]  Multiple Vitamins-Minerals (MULTIVITAMIN WITH MINERALS) tablet Take 1 tablet by mouth daily. Centrum men 50+   Yes [provider]  Omega-3 1000 MG CAPS Take 1,000 mg by mouth daily.   Yes [provider]  OZEMPIC, 1 MG/DOSE, 4 MG/3ML SOPN Inject 1 mg into the skin once a week. 02/07/22  Yes [provider]  valACYclovir  (VALTREX) 1000 MG tablet Take 1,000 mg by mouth at bedtime. 02/12/19  Yes [provider]    Physical Exam: Vitals:   05/21/22 1418 05/21/22 1421 05/21/22 1430 05/21/22 1600  BP:  (!) 126/53 (!) 127/59 (!) 134/59  Pulse:  63 62 62  Resp:  '18 18 14  '$ Temp:  97.9 F (36.6 C)    TempSrc:  Oral    SpO2:  (!) 89% 96% 96%  Weight: 112 kg     Height: '5\' 9"'$  (1.753 m)       Constitutional: NAD, calm, comfortable Vitals:   05/21/22 1418 05/21/22 1421 05/21/22 1430 05/21/22 1600  BP:  (!) 126/53 (!) 127/59 (!) 134/59  Pulse:  63 62 62  Resp:  '18 18 14  '$ Temp:  97.9 F (36.6 C)    TempSrc:  Oral    SpO2:  (!) 89% 96% 96%  Weight: 112 kg     Height: '5\' 9"'$  (1.753 m)      Eyes: PERRL, lids and conjunctivae normal ENMT: Mucous membranes are moist.  Neck: normal, supple, no masses, no thyromegaly Respiratory: clear to auscultation bilaterally, no wheezing, no crackles. Normal respiratory effort. No accessory muscle use.  Cardiovascular: Regular rate and rhythm, no murmurs / rubs / gallops.  Trace bilateral extremity edema. 2+ pedal pulses.  Abdomen: no tenderness, no masses palpated. No hepatosplenomegaly. Bowel sounds positive.  Musculoskeletal: no clubbing / cyanosis. No joint deformity upper and lower extremities. Good ROM, no contractures.  Skin: no rashes, lesions, ulcers. No induration Neurologic: No apparent cranial nerve abnormality, moving all extremities spontaneously.  Psychiatric: Normal judgment and insight. Alert and oriented x 3. Normal mood.   Labs on Admission: I have personally reviewed following labs and imaging studies  CBC: Recent Labs  Lab 05/21/22 1221 05/21/22 1449  WBC 10.0 10.5  HGB 7.6* 7.8*  HCT 26.0* 24.3*  MCV 85.0 83.5  PLT 305 673   Basic Metabolic Panel: Recent Labs  Lab 05/21/22 1221 05/21/22 1449  NA 137 139  K 4.1 4.2  CL 99 99  CO2 25 28  GLUCOSE 138* 185*  BUN 20 25*  CREATININE 1.21 1.47*  CALCIUM 8.3* 8.1*    GFR: Estimated Creatinine Clearance: 50.3 mL/min (A) (by C-G formula based on SCr of 1.47 mg/dL (H)). Liver Function Tests: Recent Labs  Lab 05/21/22 1221  AST 17  ALT 14  ALKPHOS 64  BILITOT 0.7  PROT 6.9  ALBUMIN 3.4*   Thyroid Function Tests: Recent Labs    05/21/22 1221  TSH 3.037   Radiological Exams on Admission: DG Chest 2 View  Result Date: 05/21/2022 CLINICAL DATA:  Shortness of breath EXAM: CHEST - 2 VIEW COMPARISON:  03/13/2022 FINDINGS: Globular enlargement of the cardiopericardial silhouette, similar in appearance to prior. Aortic atherosclerosis. Chronic elevation of the right hemidiaphragm with streaky right basilar opacity. Small right pleural effusion. No pneumothorax. Minimally displaced subacute right ninth and tenth rib fractures. IMPRESSION: 1. Small right pleural effusion with streaky right basilar opacity, which may represent atelectasis versus pneumonia. 2. Globular enlargement of the cardiopericardial silhouette, similar to prior. Underlying pericardial effusion not excluded. Electronically Signed   By: Davina Poke D.O.   On: 05/21/2022 15:14    EKG: Independently reviewed.  Sinus rhythm rate 63, QTc 446.  Assessment/Plan Principal Problem:   Acute anemia Active Problems:   Pneumonia   Acute respiratory failure with hypoxia (HCC)   Essential hypertension   Diabetes mellitus with no complication (HCC)   OSA (obstructive sleep apnea)   Chronic systolic dysfunction of left ventricle   A-fib (HCC)    Assessment and Plan: * Acute anemia Acute symptomatic anemia, hemoglobin down to 7.8, 12-13 last checked about 6 months ago.  He is on anticoagulation with Eliquis.  Stool FOBT positive.  No hematochezia, unsure if any melena history.  No EGD on file.  Last colonoscopy 2018 by Dr. Percival Spanish 2 polyps , diverticulosis and external hemorrhoids. -Transfuse 1 unit PRBC -IV Lasix 40 mg x 1 with transfusion -GI to see -Clear liquid diet, n.p.o.  midnight - IV Protonix 40 BID -Hold Eliquis  Pneumonia Symptomatic with productive cough, dyspnea which may be secondary to pneumonia versus anemia.  Rules out for sepsis.  Afebrile.  WBC 10.5.  With acute respiratory failure O2 sats 89% on room air.  Chest x-ray shows right base opacity atelectasis versus pneumonia, with small right pleural effusion. -IV ceftriaxone and doxycycline ( family declined azithromycin - nausea allergy). -Mucolytic's as needed  Acute respiratory failure with hypoxia (HCC) O2 sats down to 89% on room air, currently on 2 L sats 96%.  Chest x-ray suggesting pneumonia out of atelectasis.  He has been on Eliquis without PE.  A-fib ([place]) Rate controlled, on amiodarone and carvedilol and on anticoagulation with Eliquis -Resume amiodarone and carvedilol -Hold Eliquis  Chronic systolic dysfunction of left ventricle Stable and compensated.  BNP stable at 166.  Last echo 07/2021, EF of 60 to 65% with indeterminate LV diastolic parameters. -IV Lasix 40 mg x 1 with transfusion, then resume 40 twice daily -Resume carvedilol  OSA (obstructive sleep apnea) CPAP nightly  Diabetes mellitus with no complication (HCC) Reports history of hypoglycemia, recent insulin dose adjustments.  He is also on metformin, weekly Ozempic, and glimepiride and Lantus 30 units daily. - SSI- M q6h -Hold metformin Ozempic and glimepiride and Lantus - HgbA1c  Essential hypertension Stable. -Resume Norvasc, carvedilol, Lasix, lisinopril 40 daily,   DVT prophylaxis: SCDs Code Status: Full code Family Communication: Daughter and friend at bedside Disposition Plan: ~ 2 days Consults called: GI  admission status: Inpatient, telemetry I certify that at  the point of admission it is my clinical judgment that the patient will require inpatient hospital care spanning beyond 2 midnights from the point of admission due to high intensity of service, high risk for further deterioration and high  frequency of surveillance required.   Author: Bethena Roys, MD 05/21/2022 6:46 PM  For on call review [email or web address].

## 2022-05-21 NOTE — Assessment & Plan Note (Addendum)
Acute symptomatic anemia, hemoglobin down to 7.8, 12-13 last checked about 6 months ago.  He is on anticoagulation with Eliquis.  Stool FOBT positive.  No hematochezia, unsure if any melena history.  No EGD on file.  Last colonoscopy 2018 by Dr. Percival Spanish 2 polyps , diverticulosis and external hemorrhoids. -Transfuse 1 unit PRBC -IV Lasix 40 mg x 1 with transfusion -GI to see -Clear liquid diet, n.p.o. midnight - IV Protonix 40 BID -Hold Eliquis

## 2022-05-21 NOTE — Assessment & Plan Note (Addendum)
CPAP nightly

## 2022-05-21 NOTE — Assessment & Plan Note (Signed)
Rate controlled, on amiodarone and carvedilol and on anticoagulation with Eliquis -Resume amiodarone and carvedilol -Hold Eliquis

## 2022-05-21 NOTE — Assessment & Plan Note (Addendum)
O2 sats down to 89% on room air, currently on 2 L sats 96%.  Chest x-ray suggesting pneumonia out of atelectasis.  He has been on Eliquis without PE.

## 2022-05-21 NOTE — ED Notes (Signed)
Report given to [facility]

## 2022-05-21 NOTE — Progress Notes (Signed)
Primary Care Physician: [name], MD Primary Cardiologist: Gwenlyn Found Primary Electrophysiologist: Genevie Ann is a 81 y.o. male with a history of persistent atrial fibrillation who presents for follow up in the [facility].  Since last being seen in clinic, the patient reports doing very well. afib burden low. No sustained AF. No bleeding issues on [place]. He did lose his wife unexpectedly a couple of months ago and is still grieving over this. He has chronic LLE, which he feels is at his baseline . He states that the legs go  down by the morning and fluid accumulates during the day.   F/u in afib clinic, 07/04/21. He underwent repeat ablation one month ago. He is in rate controlled afib. He states that he went to his PCP a few days after ablation and was in afib then and has persisted  since then. He feels like his weight is up 10 lbs, some from food consumption but feels his abdomen is swollen. He eats out almost every meal since his wife died. He wnet into persistent afib in the spring but was not ready for repeat ablation. He is not favorable to a cardioversion today. He feels very winded. +LLE. Previously failed tikosyn.   F/u in afib clinic, 07/12/21. He remains in rate controlled afib. He still does not want a cardioversion. I switched him to torsemide on last visit for some fluid/weight gain. Previously;y on 60 mg lasix daily. He states that it was expensive, his weight is up another 2 lbs and he feels that he did better on lasix. He states that his legs are much less swollen  in the am, and he feels good.   F/u in afib clinic, 12/14. He has been loading on amiodarone for one month and is ready for cardioversion. Inially, he said he did  not want another cardioversion but explained to pt that amiodarone is not achieving full effectiveness to restore SR  unless followed by cardioversion or else stop the drug. He is agreeable now to pursue cardioversion. No  missed anticoagulation. His fluid weight is staying normal.  F/u in afib clinic. 11/10/21, for pre procedural note for cardioversion after loading on amiodarone x the last several months. Pt did not initially want a repeat cardioversion but on last visit it was explained to pt we should stop amiodarone if he did not want to go thru with cardioversion to restore SR. He is her with his daughter today. They are both nervous regarding him developing acute pulmonary edema that happened after the cardioversion last fall, that resulted in admission  to the hospital. Today, he weighs 254 lbs dressed, but feels 5 lbs of this is shoes/clothes. He was 260 lbs when he went into the last cardioversion and he was much more short of breath going into the procedure. He was 241 when  he came out of the hospital  last fall, weight has climbed to the upper 240's gradually. No recent sudden weight gain.  He is rate controlled today. After discussing this, they are both willing to go thru with cardioversion. No missed anticoagulation.    F/u in afib clinic,s/p successful cardioversion, after loading on amiodarone for over one month. He remains in SR. He called after cardioversion feeling like he was retaining some fluid. I increased lasix to 60 mg bid for several days and he's weight is down 2 lbs today and he feels better. He will go back to Lasix 40 mg bid  tomorrow. Bmet today.   F/u in afib clinic, 02/15/22. He is in SR today and feels he is staying in rhythm. His weight is stable and he feels his fluid has been  manageable with current lasix dose. O complaints voiced.   F/u in afib clinic, 05/21/22 as pt felt  he may be back in afib as he has felt more tired, short of breath since the end of last week. He states that this started with UR symptoms at that time. He has noted cough with brown d/c, and runny nose. He had chronic shortness of breath but has worsened since that time. No fever or chills, has not tested for covid. His PO  on room was 95-96%. EKG shows SR and his weight is unchanged form last visit here. He does not feel he is carrying extra fluid. He is wearing a mask today. Being compliant with Cpap.  Today, he  denies symptoms of palpitations, chest pain, +for shortness of breath,  and lower extremity edema, neg for dizziness, presyncope, syncope, snoring, daytime somnolence, bleeding, or neurologic sequela. The patient is tolerating medications without difficulties and is otherwise without complaint today.    Atrial Fibrillation Risk Factors:  he does have symptoms or diagnosis of sleep apnea. he is compliant with CPAP therapy.  he does not have a history of rheumatic fever.  he does not have a history of alcohol use.  he has a BMI of Body mass index is 36.56 kg/m.[name] Filed Weights   05/21/22 1052  Weight: 112.3 kg    LA size: 40   Atrial Fibrillation Management history:  Previous antiarrhythmic drugs: Tikosyn  Previous cardioversions: 2016   Previous ablations: 2016. 06/06/21  CHADS2VASC score: 5  Anticoagulation history: Eliquis   Past Medical History:  Diagnosis Date   Arthritis    Atrial fibrillation (HCC)    CAD (coronary artery disease)    a. Cath 03/17/15 showing 100% ostial D1, 50% prox LAD to mid LAD, 40% RPDA stenosis. Med rx. // Myoview 01/2020: EF 31 diffuse perfusion defect without reversibility (suspect artifact); reviewed with Dr. Aggie Cosier study felt to be low risk   Cataract    Mixed form OD   Chronic diastolic CHF (congestive heart failure) (HCC)    Diabetic retinopathy ([place])    NPDR OU   Essential hypertension    Glaucoma    POAG OU   History of gout    Hyperlipidemia    Hypertensive retinopathy    OU   Kidney stones    Melanoma of neck (HCC)    NICM (nonischemic cardiomyopathy) ([place])    OSA on CPAP 2012   Prostate cancer ([place])    Type II diabetes mellitus ([place])    Past Surgical History:  Procedure Laterality Date   ABDOMINAL HERNIA REPAIR      w/mesh   ATRIAL FIBRILLATION ABLATION N/A 06/06/2021   Procedure: ATRIAL FIBRILLATION ABLATION;  Surgeon: Thompson Grayer, MD;  Location: [facility];  Service: Cardiovascular;  Laterality: N/A;   CARDIAC CATHETERIZATION N/A 03/17/2015   Procedure: Left Heart Cath and Coronary Angiography;  Surgeon: Lorretta Harp, MD;  Location: [facility];  Service: Cardiovascular;  Laterality: N/A;   CARDIOVERSION N/A 08/09/2021   Procedure: CARDIOVERSION;  Surgeon: [name], MD;  Location: [facility];  Service: Cardiovascular;  Laterality: N/A;   CARDIOVERSION N/A 11/10/2021   Procedure: CARDIOVERSION;  Surgeon: Berniece Salines, DO;  Location: [facility];  Service: Cardiovascular;  Laterality: N/A;   carotid doppler  09/17/2008   rigt and left ICAs 0-49%;mildly  abnormal   CATARACT EXTRACTION Left 2020   Dr. Kathlen Mody   COLONOSCOPY N/A 05/16/2017   Procedure: COLONOSCOPY;  Surgeon: Rogene Houston, MD;  [facility];  Service: Endoscopy;  Laterality: N/A;  [place]  05/25/2009   EF 50-55%,LA mildly dilated, LV function normal   ELECTROPHYSIOLOGIC STUDY N/A 04/05/2015   Procedure: Cardioversion;  Surgeon: Sanda Klein, MD;  Location: [facility];  Service: Cardiovascular;  Laterality: N/A;   ELECTROPHYSIOLOGIC STUDY N/A 09/06/2015   Procedure: Atrial Fibrillation Ablation;  Surgeon: Thompson Grayer, MD;  Location: [facility];  Service: Cardiovascular;  Laterality: N/A;   ELECTROPHYSIOLOGIC STUDY N/A 07/12/2016   redo afib ablation by Dr Rayann Heman   EXCISIONAL HEMORRHOIDECTOMY     "inside and out"   EYE SURGERY Left 2020   Cat Sx - Dr. Kathlen Mody   FINE NEEDLE ASPIRATION Right    knee; "drew ~ 1 quart off"   [place] Right    "neck"   NM MYOCAR PERF WALL MOTION  02/21/2012   EF 61% ,EXERCISE 7 METS. exercise stopped due to wheezing and shortness of breathe   POLYPECTOMY  05/16/2017    Procedure: POLYPECTOMY;  Surgeon: Rogene Houston, MD;  [facility];  Service: Endoscopy;;  colon   PROSTATECTOMY     SHOULDER OPEN ROTATOR CUFF REPAIR Right X 2   TEE WITHOUT CARDIOVERSION N/A 09/05/2015   Procedure: TRANSESOPHAGEAL ECHOCARDIOGRAM (TEE);  Surgeon: Sueanne Margarita, MD;  [facility];  Service: Cardiovascular;  Laterality: N/A;    Current Outpatient Medications  Medication Sig Dispense Refill   albuterol (VENTOLIN HFA) 108 (90 Base) MCG/ACT inhaler Inhale 2 puffs into the lungs every 4 (four) hours as needed for wheezing or shortness of breath.     amiodarone (PACERONE) 200 MG tablet TAKE 1 TABLET BY MOUTH EVERY DAY 90 tablet 1   amLODipine (NORVASC) 10 MG tablet Take 1 tablet (10 mg total) by mouth at bedtime. 90 tablet 3   carvedilol (COREG) 6.25 MG tablet Takes 1 tablet by mouth in the AM and 1 tablet by mouth in the PM     Cholecalciferol (VITAMIN D-3 PO) Take 1,000 mg by mouth daily.     COMBIGAN 0.2-0.5 % ophthalmic solution Apply 1 drop to eye 2 (two) times daily.     CRANBERRY PO Take 1 tablet by mouth daily.     diphenhydramine-acetaminophen (TYLENOL PM) 25-500 MG TABS tablet Take 2 tablets by mouth at bedtime.     Docusate Calcium (STOOL SOFTENER PO) Take 100 mg by mouth every evening.     dorzolamide-timolol (COSOPT) 22.3-6.8 MG/ML ophthalmic solution Place 1 drop into both eyes 2 (two) times daily.     ELIQUIS 5 MG TABS tablet TAKE 1 TABLET BY MOUTH TWICE A DAY 180 tablet 1   furosemide (LASIX) 40 MG tablet TAKE 1 TABLET BY MOUTH TWICE A DAY 180 tablet 2   gemfibrozil (LOPID) 600 MG tablet Take 600 mg by mouth at bedtime.      glimepiride (AMARYL) 2 MG tablet Take 2 mg by mouth daily with lunch.     Insulin Glargine (BASAGLAR KWIKPEN) 100 UNIT/ML Inject 35 Units into the skin at bedtime. (Patient taking differently: Inject 40 Units into the skin at bedtime.) 40 mL    KLOR-CON M20 20 MEQ tablet TAKE 1 TABLET BY  MOUTH EVERY DAY 90 tablet 1    lisinopril (ZESTRIL) 40 MG tablet Take 40 mg by mouth daily.     Melatonin 10 MG TABS Take 10 mg by mouth at bedtime.     metFORMIN (GLUCOPHAGE-XR) 500 MG 24 hr tablet Take 1,000 mg by mouth in the morning and at bedtime.     mineral oil liquid Take 15 mLs by mouth daily as needed for moderate constipation.     Multiple Vitamins-Minerals (MULTIVITAMIN WITH MINERALS) tablet Take 1 tablet by mouth daily. Centrum men 50+     Omega-3 1000 MG CAPS Take 1,000 mg by mouth daily.     OZEMPIC, 1 MG/DOSE, 4 MG/3ML SOPN Inject 1 mg into the skin once a week.     valACYclovir (VALTREX) 1000 MG tablet Take 1,000 mg by mouth at bedtime.     No current facility-administered medications for this encounter.    Allergies  Allergen Reactions   Azithromycin Nausea Only   Tape Rash and Other (See Comments)    Causes skin redness, Use paper tape only.    Social History   Socioeconomic History   Marital status: Married    Spouse name: Not on file   Number of children: Not on file   Years of education: Not on file   Highest education level: Not on file  Occupational History   Occupation: Retired  Tobacco Use   Smoking status: Former    Packs/day: 2.00    Years: 27.00    Total pack years: 54.00    Types: Cigarettes    Quit date: 10/31/1983    Years since quitting: 38.5   Smokeless tobacco: Never   Tobacco comments:    Former smoker 09/27/21  Vaping Use   Vaping Use: Never used  Substance and Sexual Activity   Alcohol use: No    Alcohol/week: 0.0 standard drinks of alcohol    Comment: "used to drink; stopped ~ 2008"   Drug use: No   Sexual activity: Not on file  Other Topics Concern   Not on file  Social History Narrative   Lives in [place], [place] with wife.   Social Determinants of Health   Financial Resource Strain: Not on file  Food Insecurity: Not on file  Transportation Needs: Not on file  Physical Activity: Not on file  Stress: Not on file  Social Connections: Not on file   Intimate Partner Violence: Not on file    Family History  Problem Relation Age of Onset   Heart disease Mother    Lung cancer Mother    Heart attack Mother 78   Stroke Brother    Healthy Daughter    Diabetes Father    Heart disease Father     ROS- All systems are reviewed and negative except as per the HPI above.  Physical Exam: Vitals:   05/21/22 1052  BP: (!) 124/56  Pulse: 63  SpO2: 96%  Weight: 112.3 kg  Height: '5\' 9"'$  (1.753 m)    GEN- The patient is well appearing, alert and oriented x 3 today.   Head- normocephalic, atraumatic Eyes-  Sclera clear, conjunctiva pink Ears- hearing intact Oropharynx- clear Neck- supple  Lungs- Clear to ausculation bilaterally, normal work of breathing Heart- regular rate and rhythm  GI- soft, NT, ND, + BS Extremities- no clubbing, cyanosis, or  2+ edema  MS- no significant deformity or atrophy Skin- no rash or lesion Psych- euthymic mood, full affect Neuro- strength and sensation are intact  Wt Readings  from Last 3 Encounters:  05/21/22 112.3 kg  03/13/22 111.1 kg  02/26/22 112.9 kg    EKG today  Vent. rate 54 BPM PR interval 312 ms QRS duration 102 ms QT/QTcB 438/415 ms P-R-T axes 88 -70 36 Sinus bradycardia with 1st degree A-V block Left axis deviation Incomplete right bundle branch block Inferior infarct , age undetermined Anteroseptal infarct , age undetermined Abnormal ECG When compared with ECG of 16-Nov-2021 11:14, Previous  ekg is present   Epic records are reviewed at length today  Assessment and Plan:  1. Persistent atrial fibrillation  S/p second ablation in August 2022 and SR only lasted a few days He started amiodarone 08/22/21 to pursue cardioversion and restore SR He initially did not want to f/u with cardioversion but eventually consented He has been maintaining  SR until end of last week which correlates with UR symptoms  His symptoms have improved sine the end of last week Recommend covid  testing at home CXR today Cmet,tsh,BNP, Cbc today  Continue amiodarone 200 mg daily  Continue Eliquis 5 mg bid  for CHADS2VASC of 5    2. HTN Stable   3. Obstructive sleep apnea The patient reports compliance with CPAP   4. Combined systolic /diastolic HF   Normovolemic  Continue with current lasix dose   Further treatment plans will depend on above tests If no explanation form above, I will move appointment up with Dr. [name] to see if echo/ stress test are indicated   IfURI symptoms continue, may need to see PCP  Roderic Palau, NP 05/21/2022 11:32 AM

## 2022-05-21 NOTE — ED Triage Notes (Signed)
Hgb 7 from PCP. Pt not feeling well and went to PCP for checkup.

## 2022-05-21 NOTE — ED Provider Notes (Signed)
[facility] EMERGENCY DEPARTMENT Provider Note   CSN: [id number] Arrival date & time: 05/21/22  1414     History  Chief Complaint  Patient presents with   Abnormal Lab    Low hgb    Lucas Dudley is a 81 y.o. male with past medical history significant for hypertension, hyperlipidemia, hematochezia, paroxysmal A-fib is currently on anticoagulation with Eliquis, CAD, CHF who presents with concern for hemoglobin of 7.6 today at primary care doctor.  Patient was being seen and evaluated for A-fib, was not in A-fib at time of evaluation.  He is symptomatic with anemia, having some shortness of breath and fatigue.  He had desaturation on arrival today to 89% on room air placed on 2 L nasal cannula.  He denies any hematochezia, hematuria, hematemesis, or other known source of blood loss.   Abnormal Lab      Home Medications Prior to Admission medications   Medication Sig Start Date End Date Taking? Authorizing Provider  albuterol (VENTOLIN HFA) 108 (90 Base) MCG/ACT inhaler Inhale 2 puffs into the lungs every 4 (four) hours as needed for wheezing or shortness of breath. 10/27/21  Yes [provider]  amiodarone (PACERONE) 200 MG tablet TAKE 1 TABLET BY MOUTH EVERY DAY 12/21/21  Yes Sherran Needs, NP  amLODipine (NORVASC) 10 MG tablet Take 1 tablet (10 mg total) by mouth at bedtime. 09/27/21  Yes Allred, Jeneen Rinks, MD  carvedilol (COREG) 6.25 MG tablet Takes 1 tablet by mouth in the AM and 1 tablet by mouth in the PM 07/04/21  Yes Sherran Needs, NP  Cholecalciferol (VITAMIN D-3 PO) Take 1,000 mg by mouth daily.   Yes [provider]  COMBIGAN 0.2-0.5 % ophthalmic solution Apply 1 drop to eye 2 (two) times daily. 10/16/21  Yes [provider]  CRANBERRY PO Take 1 tablet by mouth daily.   Yes [provider]  diphenhydramine-acetaminophen (TYLENOL PM) 25-500 MG TABS tablet Take 2 tablets by mouth at bedtime.   Yes [provider]  Docusate Calcium  (STOOL SOFTENER PO) Take 200 mg by mouth daily.   Yes [provider]  ELIQUIS 5 MG TABS tablet TAKE 1 TABLET BY MOUTH TWICE A DAY 04/26/22  Yes Sherran Needs, NP  furosemide (LASIX) 40 MG tablet TAKE 1 TABLET BY MOUTH TWICE A DAY 10/11/21  Yes Sherran Needs, NP  gemfibrozil (LOPID) 600 MG tablet Take 600 mg by mouth at bedtime.    Yes [provider]  glimepiride (AMARYL) 2 MG tablet Take 2 mg by mouth daily with lunch.   Yes [provider]  Insulin Glargine (BASAGLAR KWIKPEN) 100 UNIT/ML Inject 35 Units into the skin at bedtime. Patient taking differently: Inject 30 Units into the skin at bedtime. 08/15/21  Yes Kathie Dike, MD  KLOR-CON M20 20 MEQ tablet TAKE 1 TABLET BY MOUTH EVERY DAY 12/21/21  Yes Sherran Needs, NP  lisinopril (ZESTRIL) 40 MG tablet Take 40 mg by mouth daily. 11/22/20  Yes [provider]  Melatonin 10 MG TABS Take 10 mg by mouth at bedtime.   Yes [provider]  metFORMIN (GLUCOPHAGE-XR) 500 MG 24 hr tablet Take 1,000 mg by mouth in the morning and at bedtime. 11/11/19  Yes [provider]  mineral oil liquid Take 15 mLs by mouth daily as needed for moderate constipation.   Yes [provider]  Multiple Vitamins-Minerals (MULTIVITAMIN WITH MINERALS) tablet Take 1 tablet by mouth daily. Centrum men 50+  Yes [provider]  Omega-3 1000 MG CAPS Take 1,000 mg by mouth daily.   Yes [provider]  OZEMPIC, 1 MG/DOSE, 4 MG/3ML SOPN Inject 1 mg into the skin once a week. 02/07/22  Yes [provider]  valACYclovir (VALTREX) 1000 MG tablet Take 1,000 mg by mouth at bedtime. 02/12/19  Yes [provider]      Allergies    Azithromycin and Tape    Review of Systems   Review of Systems  Respiratory:  Positive for shortness of breath.   All other systems reviewed and are negative.   Physical Exam Updated Vital Signs BP (!) 134/59   Pulse 62   Temp 97.9 F (36.6 C)  (Oral)   Resp 14   Ht '5\' 9"'$  (1.753 m)   Wt 112 kg   SpO2 96%   BMI 36.48 kg/m  Physical Exam Vitals and nursing note reviewed.  Constitutional:      General: He is not in acute distress.    Appearance: Normal appearance.  HENT:     Head: Normocephalic and atraumatic.  Eyes:     General:        Right eye: No discharge.        Left eye: No discharge.  Cardiovascular:     Rate and Rhythm: Normal rate and regular rhythm.     Heart sounds: No murmur heard.    No friction rub. No gallop.  Pulmonary:     Effort: Pulmonary effort is normal.     Breath sounds: Normal breath sounds.  Abdominal:     General: Bowel sounds are normal.     Palpations: Abdomen is soft.  Genitourinary:    Comments: Normal appearance of external rectum, no significantly brown or black stool on physical exam. but Hemoccult positive. Skin:    General: Skin is warm and dry.     Capillary Refill: Capillary refill takes less than 2 seconds.  Neurological:     Mental Status: He is alert and oriented to person, place, and time.  Psychiatric:        Mood and Affect: Mood normal.        Behavior: Behavior normal.     ED Results / Procedures / Treatments   Labs (all labs ordered are listed, but only abnormal results are displayed) Labs Reviewed  CBC - Abnormal; Notable for the following components:      Result Value   RBC 2.91 (*)    Hemoglobin 7.8 (*)    HCT 24.3 (*)    RDW 16.0 (*)    nRBC 0.3 (*)    All other components within normal limits  BASIC METABOLIC PANEL - Abnormal; Notable for the following components:   Glucose, Bld 185 (*)    BUN 25 (*)    Creatinine, Ser 1.47 (*)    Calcium 8.1 (*)    GFR, Estimated 48 (*)    All other components within normal limits  POC OCCULT BLOOD, ED - Abnormal; Notable for the following components:   Fecal Occult Bld POSITIVE (*)    All other components within normal limits  TYPE AND SCREEN  PREPARE RBC (CROSSMATCH)    EKG None  Radiology DG Chest 2  View  Result Date: 05/21/2022 CLINICAL DATA:  Shortness of breath EXAM: CHEST - 2 VIEW COMPARISON:  03/13/2022 FINDINGS: Globular enlargement of the cardiopericardial silhouette, similar in appearance to prior. Aortic atherosclerosis. Chronic elevation of the right hemidiaphragm with streaky right basilar opacity. Small right  pleural effusion. No pneumothorax. Minimally displaced subacute right ninth and tenth rib fractures. IMPRESSION: 1. Small right pleural effusion with streaky right basilar opacity, which may represent atelectasis versus pneumonia. 2. Globular enlargement of the cardiopericardial silhouette, similar to prior. Underlying pericardial effusion not excluded. Electronically Signed   By: Davina Poke D.O.   On: 05/21/2022 15:14    Procedures .Critical Care  Performed by: Anselmo Pickler, PA-C Authorized by: Anselmo Pickler, PA-C   Critical care provider statement:    Critical care time (minutes):  32   Critical care was necessary to treat or prevent imminent or life-threatening deterioration of the following conditions:  Circulatory failure (Symptomatic anemia requiring transfusion)   Critical care was time spent personally by me on the following activities:  Development of treatment plan with patient or surrogate, discussions with consultants, evaluation of patient's response to treatment, examination of patient, ordering and review of laboratory studies, ordering and review of radiographic studies, ordering and performing treatments and interventions, pulse oximetry, re-evaluation of patient's condition and review of old charts   Care discussed with: admitting provider       Medications Ordered in ED Medications  0.9 %  sodium chloride infusion (has no administration in time range)    ED Course/ Medical Decision Making/ A&P                           Medical Decision Making Amount and/or Complexity of Data Reviewed Labs: ordered.  Risk Prescription drug  management. Decision regarding hospitalization.   This patient is a 81 y.o. male who presents to the ED for concern of mid globin, fatigue, shortness of breath, this involves an extensive number of treatment options, and is a complaint that carries with it a high risk of complications and morbidity. The emergent differential diagnosis prior to evaluation includes, but is not limited to, symptomatic anemia, CVA, asthma exacerbation, pneumonia, electrolyte abnormality, CHF exacerbation is other.  This is not an exhaustive differential.   Past Medical History / Co-morbidities / Social History: hypertension, hyperlipidemia, hematochezia, paroxysmal A-fib is currently on anticoagulation with Eliquis, CAD, CHF  Additional history: Chart reviewed. Pertinent results include: Reviewed lab work from outpatient cardiology appointment just prior to arrival including hemoglobin of 7.6.  Reviewed lab work, imaging previous emergency department visits.  Physical Exam: Physical exam performed. The pertinent findings include: On arrival, 89% on room air.  Examination of the rectal vault reveals some soft brown stool, no overt melanotic appearance.  Lab Tests: I ordered, and personally interpreted labs.  The pertinent results include: Anemia fairly stable compared to earlier value with hemoglobin of 7.8.  Hemoccult is positive at this time.  BMP shows elevated blood glucose at 185.  BUN and creatinine mildly elevated even from lab work earlier today, some mild dehydration to be likely.  Cardiac Monitoring:  The patient was maintained on a cardiac monitor.  My attending physician Dr. Kathrynn Humble viewed and interpreted the cardiac monitored which showed an underlying rhythm of: NSR. I agree with this interpretation.   Medications: I ordered medication including blood transfusion for symptomatic anemia.  Patient will require reevaluation during hospitalization.  Consultations Obtained: I requested consultation  with the gastroenterologist, will discuss with Dr. Abbey Chatters to consult on this patient during admission, spoke with hospitalist, Dr. Denton Brick and discussed lab and imaging findings as well as pertinent plan - they recommend: Admission for symptomatic anemia, transfusion, GI bleed on blood thinner   Disposition: After  consideration of the diagnostic results and the patients response to treatment, I feel that patient would benefit from admission, and needs blood transfusion at this time for symptomatic anemia.   I discussed this case with my attending physician Dr. Kathrynn Humble who cosigned this note including patient's presenting symptoms, physical exam, and planned diagnostics and interventions. Attending physician stated agreement with plan or made changes to plan which were implemented.    Final Clinical Impression(s) / ED Diagnoses Final diagnoses:  Symptomatic anemia    Rx / DC Orders ED Discharge Orders     None         Anselmo Pickler, PA-C 05/21/22 1629    Varney Biles, MD 05/21/22 1640

## 2022-05-22 ENCOUNTER — Encounter (HOSPITAL_COMMUNITY): Payer: Self-pay | Admitting: Internal Medicine

## 2022-05-22 DIAGNOSIS — Z7901 Long term (current) use of anticoagulants: Secondary | ICD-10-CM | POA: Diagnosis not present

## 2022-05-22 DIAGNOSIS — R195 Other fecal abnormalities: Secondary | ICD-10-CM | POA: Diagnosis not present

## 2022-05-22 DIAGNOSIS — I4819 Other persistent atrial fibrillation: Secondary | ICD-10-CM | POA: Diagnosis present

## 2022-05-22 DIAGNOSIS — J9601 Acute respiratory failure with hypoxia: Secondary | ICD-10-CM

## 2022-05-22 DIAGNOSIS — G4733 Obstructive sleep apnea (adult) (pediatric): Secondary | ICD-10-CM | POA: Diagnosis present

## 2022-05-22 DIAGNOSIS — E119 Type 2 diabetes mellitus without complications: Secondary | ICD-10-CM

## 2022-05-22 DIAGNOSIS — K5900 Constipation, unspecified: Secondary | ICD-10-CM

## 2022-05-22 DIAGNOSIS — D649 Anemia, unspecified: Secondary | ICD-10-CM | POA: Diagnosis present

## 2022-05-22 DIAGNOSIS — I2510 Atherosclerotic heart disease of native coronary artery without angina pectoris: Secondary | ICD-10-CM | POA: Diagnosis present

## 2022-05-22 DIAGNOSIS — I110 Hypertensive heart disease with heart failure: Secondary | ICD-10-CM | POA: Diagnosis present

## 2022-05-22 DIAGNOSIS — R194 Change in bowel habit: Secondary | ICD-10-CM | POA: Diagnosis not present

## 2022-05-22 DIAGNOSIS — K5730 Diverticulosis of large intestine without perforation or abscess without bleeding: Secondary | ICD-10-CM | POA: Diagnosis present

## 2022-05-22 DIAGNOSIS — I509 Heart failure, unspecified: Secondary | ICD-10-CM | POA: Diagnosis not present

## 2022-05-22 DIAGNOSIS — D62 Acute posthemorrhagic anemia: Secondary | ICD-10-CM | POA: Diagnosis present

## 2022-05-22 DIAGNOSIS — K5731 Diverticulosis of large intestine without perforation or abscess with bleeding: Secondary | ICD-10-CM | POA: Diagnosis not present

## 2022-05-22 DIAGNOSIS — K449 Diaphragmatic hernia without obstruction or gangrene: Secondary | ICD-10-CM | POA: Diagnosis present

## 2022-05-22 DIAGNOSIS — K921 Melena: Secondary | ICD-10-CM | POA: Diagnosis not present

## 2022-05-22 DIAGNOSIS — E66812 Obesity, class 2: Secondary | ICD-10-CM | POA: Diagnosis present

## 2022-05-22 DIAGNOSIS — Z7984 Long term (current) use of oral hypoglycemic drugs: Secondary | ICD-10-CM | POA: Diagnosis not present

## 2022-05-22 DIAGNOSIS — J189 Pneumonia, unspecified organism: Secondary | ICD-10-CM | POA: Diagnosis present

## 2022-05-22 DIAGNOSIS — E876 Hypokalemia: Secondary | ICD-10-CM | POA: Diagnosis present

## 2022-05-22 DIAGNOSIS — I5043 Acute on chronic combined systolic (congestive) and diastolic (congestive) heart failure: Secondary | ICD-10-CM | POA: Diagnosis present

## 2022-05-22 DIAGNOSIS — K635 Polyp of colon: Secondary | ICD-10-CM | POA: Diagnosis present

## 2022-05-22 DIAGNOSIS — Z79899 Other long term (current) drug therapy: Secondary | ICD-10-CM | POA: Diagnosis not present

## 2022-05-22 DIAGNOSIS — E1165 Type 2 diabetes mellitus with hyperglycemia: Secondary | ICD-10-CM | POA: Diagnosis present

## 2022-05-22 DIAGNOSIS — Z794 Long term (current) use of insulin: Secondary | ICD-10-CM | POA: Diagnosis not present

## 2022-05-22 DIAGNOSIS — I10 Essential (primary) hypertension: Secondary | ICD-10-CM

## 2022-05-22 DIAGNOSIS — I519 Heart disease, unspecified: Secondary | ICD-10-CM

## 2022-05-22 DIAGNOSIS — I428 Other cardiomyopathies: Secondary | ICD-10-CM | POA: Diagnosis present

## 2022-05-22 DIAGNOSIS — K5521 Angiodysplasia of colon with hemorrhage: Secondary | ICD-10-CM | POA: Diagnosis present

## 2022-05-22 DIAGNOSIS — E785 Hyperlipidemia, unspecified: Secondary | ICD-10-CM | POA: Diagnosis present

## 2022-05-22 DIAGNOSIS — K31819 Angiodysplasia of stomach and duodenum without bleeding: Secondary | ICD-10-CM | POA: Diagnosis not present

## 2022-05-22 DIAGNOSIS — D6832 Hemorrhagic disorder due to extrinsic circulating anticoagulants: Secondary | ICD-10-CM | POA: Diagnosis present

## 2022-05-22 DIAGNOSIS — K922 Gastrointestinal hemorrhage, unspecified: Secondary | ICD-10-CM | POA: Diagnosis not present

## 2022-05-22 LAB — CBC
HCT: 28 % — ABNORMAL LOW (ref 39.0–52.0)
Hemoglobin: 8.5 g/dL — ABNORMAL LOW (ref 13.0–17.0)
MCH: 25.7 pg — ABNORMAL LOW (ref 26.0–34.0)
MCHC: 30.4 g/dL (ref 30.0–36.0)
MCV: 84.6 fL (ref 80.0–100.0)
Platelets: 299 10*3/uL (ref 150–400)
RBC: 3.31 MIL/uL — ABNORMAL LOW (ref 4.22–5.81)
RDW: 15.9 % — ABNORMAL HIGH (ref 11.5–15.5)
WBC: 9.6 10*3/uL (ref 4.0–10.5)
nRBC: 0.4 % — ABNORMAL HIGH (ref 0.0–0.2)

## 2022-05-22 LAB — BASIC METABOLIC PANEL
Anion gap: 10 (ref 5–15)
BUN: 28 mg/dL — ABNORMAL HIGH (ref 8–23)
CO2: 28 mmol/L (ref 22–32)
Calcium: 7.7 mg/dL — ABNORMAL LOW (ref 8.9–10.3)
Chloride: 98 mmol/L (ref 98–111)
Creatinine, Ser: 1.34 mg/dL — ABNORMAL HIGH (ref 0.61–1.24)
GFR, Estimated: 54 mL/min — ABNORMAL LOW (ref >60)
Glucose, Bld: 188 mg/dL — ABNORMAL HIGH (ref 70–99)
Potassium: 3.6 mmol/L (ref 3.5–5.1)
Sodium: 136 mmol/L (ref 135–145)

## 2022-05-22 LAB — BPAM RBC
Blood Product Expiration Date: 202308192359
ISSUE DATE / TIME: 202308072013
Unit Type and Rh: 6200

## 2022-05-22 LAB — GLUCOSE, CAPILLARY
Glucose-Capillary: 135 mg/dL — ABNORMAL HIGH (ref 70–99)
Glucose-Capillary: 150 mg/dL — ABNORMAL HIGH (ref 70–99)
Glucose-Capillary: 156 mg/dL — ABNORMAL HIGH (ref 70–99)
Glucose-Capillary: 124 mg/dL — ABNORMAL HIGH (ref 70–99)
Glucose-Capillary: 105 mg/dL — ABNORMAL HIGH (ref 70–99)

## 2022-05-22 LAB — TYPE AND SCREEN
ABO/RH(D): A POS
Antibody Screen: NEGATIVE
Unit division: 0

## 2022-05-22 LAB — HEMOGLOBIN AND HEMATOCRIT, BLOOD
HCT: 29.1 % — ABNORMAL LOW (ref 39.0–52.0)
Hemoglobin: 8.6 g/dL — ABNORMAL LOW (ref 13.0–17.0)

## 2022-05-22 MED ORDER — PEG 3350-KCL-NA BICARB-NACL 420 G PO SOLR
4000.0000 mL | Freq: Once | ORAL | Status: AC
Start: 2022-05-22 — End: 2022-05-22
  Administered 2022-05-22: 4000 mL via ORAL

## 2022-05-22 MED ORDER — BRIMONIDINE TARTRATE 0.2 % OP SOLN
1.0000 [drp] | Freq: Two times a day (BID) | OPHTHALMIC | Status: DC
  Administered 2022-05-22 – 2022-06-01 (×19): 1 [drp] via OPHTHALMIC
  Filled 2022-05-22 (×3): qty 5

## 2022-05-22 MED ORDER — TIMOLOL MALEATE 0.5 % OP SOLN
1.0000 [drp] | Freq: Two times a day (BID) | OPHTHALMIC | Status: DC
  Administered 2022-05-22 – 2022-06-01 (×19): 1 [drp] via OPHTHALMIC
  Filled 2022-05-22 (×3): qty 5

## 2022-05-22 NOTE — Assessment & Plan Note (Signed)
-  Body mass index is 36.48 kg/m. -Low calorie diet, portion control and increase physical activity discussed with patient.

## 2022-05-22 NOTE — Consult Note (Addendum)
$'@LOGO'n$ @   Referring Provider: [facility] hospitalist Primary Care Physician:  [name], MD Primary Gastroenterologist:  Dr. Laural Golden previously  Date of Admission:  Date of Consultation:   Reason for Consultation: Acute symptomatic anemia, on chronic anticoagulation  HPI:  Lucas Dudley is a 81 y.o. year old male with medical history significant for atrial fibrillation on chronic anticoagulation, systolic CHF, hypertension, diabetes, who presented to the emergency room 8/7 after having follow-up with cardiology with CBC showing hemoglobin of 7.6, down from 13.1, 6 months ago.  Patient reported associated fatigue over the last few weeks and difficulty breathing.  ED course: Vital signs stable aside from O2 saturation 89% on room air, placed on 2 L nasal cannula with improvement. Repeat hemoglobin 7.8. Stool heme positive. BUN elevated at 25, creatinine elevated at 1.47. Chest x-ray completed by cardiologist earlier same day showed small right pleural effusion with streaky right base opacity which may represent atelectasis or pneumonia.  1 unit PRBCs ordered.  He was started on IV PPI twice daily, Eliquis placed on hold.  Also started on Rocephin and doxycycline for pneumonia.  Repeat hemoglobin this morning 8.5.  BUN increased to 28, creatinine improved to 1.34.  Consult:  Noticed increased fatigue and shortness of breath for the last few weeks. Thought it was his A fib. No brbpr. Occasional black streaks in his stools, but only noticed a completely black stool after taking pepto bismol. Reports few months of lower abdominal discomfort and constipation. Taking 2 stool softeners. Was taking 1 daily, but this wasn't helping. Has also tried mineral oil. Used to have a BM every day. Bowels are moving every 2-3 days. Stools are mushy. First part of the stool is hard. May have to sit for 45 minutes. Lower abdominal pain improves with a BM.  Had a hemorrhoid operation years ago.   No upper  abdominal pain, nausea, vomiting. No heartburn, but has been burping a lot. No dysphagia. Every now and then will have burning in the epigastric area which he will take tums. This is not routine.   Started Ozempic a few months ago.   Started Advil, 2 nightly, 2 weeks ago.   No weight loss.   Last dose of Eliquis: Morning of 8/7.   SOB improved. Little increased cough recently. No fever.    No prior EGD.   Last colonoscopy in August 2018 with 2 polyps in the transverse and ascending colon biopsied, diverticulosis in sigmoid colon, external hemorrhoids.  Pathology with tubular adenomas, recommended 5-year surveillance.  Past Medical History:  Diagnosis Date   Arthritis    Atrial fibrillation (HCC)    CAD (coronary artery disease)    a. Cath 03/17/15 showing 100% ostial D1, 50% prox LAD to mid LAD, 40% RPDA stenosis. Med rx. // Myoview 01/2020: EF 31 diffuse perfusion defect without reversibility (suspect artifact); reviewed with Dr. Aggie Cosier study felt to be low risk   Cataract    Mixed form OD   Chronic diastolic CHF (congestive heart failure) (HCC)    Diabetic retinopathy ([place])    NPDR OU   Essential hypertension    Glaucoma    POAG OU   History of gout    Hyperlipidemia    Hypertensive retinopathy    OU   Kidney stones    Melanoma of neck ([place])    NICM (nonischemic cardiomyopathy) ([place])    OSA on CPAP 2012   Prostate cancer ([place])    Type II diabetes mellitus ([place])  Past Surgical History:  Procedure Laterality Date   ABDOMINAL HERNIA REPAIR     w/mesh   ATRIAL FIBRILLATION ABLATION N/A 06/06/2021   Procedure: ATRIAL FIBRILLATION ABLATION;  Surgeon: Thompson Grayer, MD;  Location: [facility];  Service: Cardiovascular;  Laterality: N/A;   CARDIAC CATHETERIZATION N/A 03/17/2015   Procedure: Left Heart Cath and Coronary Angiography;  Surgeon: Lorretta Harp, MD;  Location: [facility];  Service: Cardiovascular;  Laterality: N/A;   CARDIOVERSION N/A  08/09/2021   Procedure: CARDIOVERSION;  Surgeon: [name], MD;  Location: [place];  Service: Cardiovascular;  Laterality: N/A;   CARDIOVERSION N/A 11/10/2021   Procedure: CARDIOVERSION;  Surgeon: Berniece Salines, DO;  Location: [place];  Service: Cardiovascular;  Laterality: N/A;   carotid doppler  09/17/2008   rigt and left ICAs 0-49%;mildly  abnormal   CATARACT EXTRACTION Left 2020   Dr. Kathlen Mody   COLONOSCOPY N/A 05/16/2017   Procedure: COLONOSCOPY;  Surgeon: Rogene Houston, MD;  [facility];  Service: Endoscopy;  Laterality: N/A;  [place]  05/25/2009   EF 50-55%,LA mildly dilated, LV function normal   ELECTROPHYSIOLOGIC STUDY N/A 04/05/2015   Procedure: Cardioversion;  Surgeon: Sanda Klein, MD;  Location: [facility];  Service: Cardiovascular;  Laterality: N/A;   ELECTROPHYSIOLOGIC STUDY N/A 09/06/2015   Procedure: Atrial Fibrillation Ablation;  Surgeon: Thompson Grayer, MD;  Location: [facility];  Service: Cardiovascular;  Laterality: N/A;   ELECTROPHYSIOLOGIC STUDY N/A 07/12/2016   redo afib ablation by Dr Rayann Heman   EXCISIONAL HEMORRHOIDECTOMY     "inside and out"   EYE SURGERY Left 2020   Cat Sx - Dr. Kathlen Mody   FINE NEEDLE ASPIRATION Right    knee; "drew ~ 1 quart off"   Foster Right    "neck"   NM MYOCAR PERF WALL MOTION  02/21/2012   EF 61% ,EXERCISE 7 METS. exercise stopped due to wheezing and shortness of breathe   POLYPECTOMY  05/16/2017   Procedure: POLYPECTOMY;  Surgeon: Rogene Houston, MD;  [facility];  Service: Endoscopy;;  colon   PROSTATECTOMY     SHOULDER OPEN ROTATOR CUFF REPAIR Right X 2   TEE WITHOUT CARDIOVERSION N/A 09/05/2015   Procedure: TRANSESOPHAGEAL ECHOCARDIOGRAM (TEE);  Surgeon: Sueanne Margarita, MD;  Location: [facility];  Service: Cardiovascular;  Laterality: N/A;    Prior to Admission medications   Medication  Sig Start Date End Date Taking? Authorizing Provider  albuterol (VENTOLIN HFA) 108 (90 Base) MCG/ACT inhaler Inhale 2 puffs into the lungs every 4 (four) hours as needed for wheezing or shortness of breath. 10/27/21  Yes [provider]  amiodarone (PACERONE) 200 MG tablet TAKE 1 TABLET BY MOUTH EVERY DAY 12/21/21  Yes Sherran Needs, NP  amLODipine (NORVASC) 10 MG tablet Take 1 tablet (10 mg total) by mouth at bedtime. 09/27/21  Yes Allred, Jeneen Rinks, MD  carvedilol (COREG) 6.25 MG tablet Takes 1 tablet by mouth in the AM and 1 tablet by mouth in the PM 07/04/21  Yes Sherran Needs, NP  Cholecalciferol (VITAMIN D-3 PO) Take 1,000 mg by mouth daily.   Yes [provider]  COMBIGAN 0.2-0.5 % ophthalmic solution Apply 1 drop to eye 2 (two) times daily. 10/16/21  Yes [provider]  CRANBERRY PO Take 1 tablet by mouth daily.   Yes [provider]  diphenhydramine-acetaminophen (TYLENOL  PM) 25-500 MG TABS tablet Take 2 tablets by mouth at bedtime.   Yes [provider]  Docusate Calcium (STOOL SOFTENER PO) Take 200 mg by mouth daily.   Yes [provider]  ELIQUIS 5 MG TABS tablet TAKE 1 TABLET BY MOUTH TWICE A DAY 04/26/22  Yes Sherran Needs, NP  furosemide (LASIX) 40 MG tablet TAKE 1 TABLET BY MOUTH TWICE A DAY 10/11/21  Yes Sherran Needs, NP  gemfibrozil (LOPID) 600 MG tablet Take 600 mg by mouth at bedtime.    Yes [provider]  glimepiride (AMARYL) 2 MG tablet Take 2 mg by mouth daily with lunch.   Yes [provider]  Insulin Glargine (BASAGLAR KWIKPEN) 100 UNIT/ML Inject 35 Units into the skin at bedtime. Patient taking differently: Inject 30 Units into the skin at bedtime. 08/15/21  Yes Kathie Dike, MD  KLOR-CON M20 20 MEQ tablet TAKE 1 TABLET BY MOUTH EVERY DAY 12/21/21  Yes Sherran Needs, NP  lisinopril (ZESTRIL) 40 MG tablet Take 40 mg by mouth daily. 11/22/20  Yes [provider]  Melatonin 10 MG TABS Take  10 mg by mouth at bedtime.   Yes [provider]  metFORMIN (GLUCOPHAGE-XR) 500 MG 24 hr tablet Take 1,000 mg by mouth in the morning and at bedtime. 11/11/19  Yes [provider]  mineral oil liquid Take 15 mLs by mouth daily as needed for moderate constipation.   Yes [provider]  Multiple Vitamins-Minerals (MULTIVITAMIN WITH MINERALS) tablet Take 1 tablet by mouth daily. Centrum men 50+   Yes [provider]  Omega-3 1000 MG CAPS Take 1,000 mg by mouth daily.   Yes [provider]  OZEMPIC, 1 MG/DOSE, 4 MG/3ML SOPN Inject 1 mg into the skin once a week. 02/07/22  Yes [provider]  valACYclovir (VALTREX) 1000 MG tablet Take 1,000 mg by mouth at bedtime. 02/12/19  Yes [provider]    Current Facility-Administered Medications  Medication Dose Route Frequency Provider Last Rate Last Admin   acetaminophen (TYLENOL) tablet 650 mg  650 mg Oral Q6H PRN Emokpae, Ejiroghene E, MD   650 mg at 05/21/22 2241   Or   acetaminophen (TYLENOL) suppository 650 mg  650 mg Rectal Q6H PRN Emokpae, Ejiroghene E, MD       amiodarone (PACERONE) tablet 200 mg  200 mg Oral Daily Emokpae, Ejiroghene E, MD       amLODipine (NORVASC) tablet 10 mg  10 mg Oral QHS Emokpae, Ejiroghene E, MD   10 mg at 05/21/22 2157   brimonidine-timolol (COMBIGAN) 0.2-0.5 % ophthalmic solution 1 drop  1 drop Ophthalmic BID Emokpae, Ejiroghene E, MD       carvedilol (COREG) tablet 6.25 mg  6.25 mg Oral BID WC Emokpae, Ejiroghene E, MD       cefTRIAXone (ROCEPHIN) 2 g in sodium chloride 0.9 % 100 mL IVPB  2 g Intravenous Q24H Emokpae, Ejiroghene E, MD   Stopped at 05/21/22 1843   doxycycline (VIBRAMYCIN) 100 mg in sodium chloride 0.9 % 250 mL IVPB  100 mg Intravenous Q12H Emokpae, Ejiroghene E, MD   Stopped at 05/22/22 0139   furosemide (LASIX) tablet 40 mg  40 mg Oral BID Emokpae, Ejiroghene E, MD       gemfibrozil (LOPID) tablet 600 mg  600 mg Oral QHS Emokpae, Ejiroghene  E, MD   600 mg at 05/21/22 2159   guaiFENesin-dextromethorphan (ROBITUSSIN DM) 100-10 MG/5ML syrup 10 mL  10 mL Oral  Q6H PRN Emokpae, Ejiroghene E, MD       insulin aspart (novoLOG) injection 0-15 Units  0-15 Units Subcutaneous Q6H Emokpae, Ejiroghene E, MD   3 Units at 05/21/22 2158   lisinopril (ZESTRIL) tablet 40 mg  40 mg Oral Daily Emokpae, Ejiroghene E, MD       melatonin tablet 6 mg  6 mg Oral QHS Emokpae, Ejiroghene E, MD   6 mg at 05/21/22 2317   ondansetron (ZOFRAN) tablet 4 mg  4 mg Oral Q6H PRN Emokpae, Ejiroghene E, MD       Or   ondansetron (ZOFRAN) injection 4 mg  4 mg Intravenous Q6H PRN Emokpae, Ejiroghene E, MD       pantoprazole (PROTONIX) injection 40 mg  40 mg Intravenous Q12H Emokpae, Ejiroghene E, MD   40 mg at 05/21/22 2317   polyethylene glycol (MIRALAX / GLYCOLAX) packet 17 g  17 g Oral BID Emokpae, Ejiroghene E, MD   17 g at 05/21/22 2157   valACYclovir (VALTREX) tablet 1,000 mg  1,000 mg Oral QHS Emokpae, Ejiroghene E, MD   1,000 mg at 05/21/22 2157    Allergies as of 05/21/2022 - Review Complete 05/21/2022  Allergen Reaction Noted   Azithromycin Nausea Only 11/10/2021   Tape Rash and Other (See Comments) 01/28/2012    Family History  Problem Relation Age of Onset   Heart disease Mother    Lung cancer Mother    Heart attack Mother 73   Stroke Brother    Healthy Daughter    Diabetes Father    Heart disease Father     Social History   Socioeconomic History   Marital status: Married    Spouse name: Not on file   Number of children: Not on file   Years of education: Not on file   Highest education level: Not on file  Occupational History   Occupation: Retired  Tobacco Use   Smoking status: Former    Packs/day: 2.00    Years: 27.00    Total pack years: 54.00    Types: Cigarettes    Quit date: 10/31/1983    Years since quitting: 38.5   Smokeless tobacco: Never   Tobacco comments:    Former smoker 09/27/21  Vaping Use   Vaping Use: Never used   Substance and Sexual Activity   Alcohol use: No    Alcohol/week: 0.0 standard drinks of alcohol    Comment: "used to drink; stopped ~ 2008"   Drug use: No   Sexual activity: Not on file  Other Topics Concern   Not on file  Social History Narrative   Lives in [place], [place] with wife.   Social Determinants of Health   Financial Resource Strain: Not on file  Food Insecurity: Not on file  Transportation Needs: Not on file  Physical Activity: Not on file  Stress: Not on file  Social Connections: Not on file  Intimate Partner Violence: Not on file    Review of Systems: Gen: Denies fever, chills, pre-syncope, or syncope.  CV: Denies chest pain, heart palpitations. Resp: Denies shortness of breath, cough.  GI: See HPI GU : Denies urinary burning, urinary frequency, urinary incontinence.  MS: Denies joint pain,swelling, cramping Derm: Denies rash. Psych: Denies depression, anxiety. Heme: See HPI  Physical Exam: Vital signs in last 24 hours: Temp:  [97.9 F (36.6 C)-100.4 F (38 C)] 98.3 F (36.8 C) (08/08 0243) Pulse Rate:  [55-68] 68 (08/08 0243) Resp:  [14-20] 19 (08/08 0243) BP: (122-143)/(41-71)  122/41 (08/08 0243) SpO2:  [89 %-99 %] 97 % (08/08 0243) Weight:  [924 kg-112.3 kg] 112 kg (08/07 1418)   General:   Alert,  Well-developed, well-nourished, pleasant and cooperative in NAD Head:  Normocephalic and atraumatic. Eyes:  Sclera clear, no icterus.   Conjunctiva pink. Ears:  Normal auditory acuity. Lungs:  Clear throughout to auscultation.   No wheezes, crackles, or rhonchi. No acute distress. Heart:  Regular rate and rhythm; no murmurs, clicks, rubs,  or gallops. Abdomen:  Soft and nondistended. Mild TTP in LLQ. No masses, hepatosplenomegaly or hernias noted. Normal bowel sounds, without guarding, and without rebound.   Rectal:  Deferred.  Msk:  Symmetrical without gross deformities. Normal posture. Extremities:  With trace LE edema. Neurologic:  Alert and  oriented  x4;  grossly normal neurologically. Skin:  Intact without significant lesions or rashes. Psych:  Normal mood and affect.  Intake/Output from previous day: 08/07 0701 - 08/08 0700 In: 897.6 [P.O.:240; Blood:315; IV Piggyback:342.6] Out: 1500 [Urine:1500] Intake/Output this shift: No intake/output data recorded.  Lab Results: Recent Labs    05/21/22 1221 05/21/22 1449 05/22/22 0005  WBC 10.0 10.5 9.6  HGB 7.6* 7.8* 8.6*  8.5*  HCT 26.0* 24.3* 29.1*  28.0*  PLT 305 331 299   BMET Recent Labs    05/21/22 1221 05/21/22 1449 05/22/22 0005  NA 137 139 136  K 4.1 4.2 3.6  CL 99 99 98  CO2 '25 28 28  '$ GLUCOSE 138* 185* 188*  BUN 20 25* 28*  CREATININE 1.21 1.47* 1.34*  CALCIUM 8.3* 8.1* 7.7*   LFT Recent Labs    05/21/22 1221  PROT 6.9  ALBUMIN 3.4*  AST 17  ALT 14  ALKPHOS 64  BILITOT 0.7    Studies/Results: DG Chest 2 View  Result Date: 05/21/2022 CLINICAL DATA:  Shortness of breath EXAM: CHEST - 2 VIEW COMPARISON:  03/13/2022 FINDINGS: Globular enlargement of the cardiopericardial silhouette, similar in appearance to prior. Aortic atherosclerosis. Chronic elevation of the right hemidiaphragm with streaky right basilar opacity. Small right pleural effusion. No pneumothorax. Minimally displaced subacute right ninth and tenth rib fractures. IMPRESSION: 1. Small right pleural effusion with streaky right basilar opacity, which may represent atelectasis versus pneumonia. 2. Globular enlargement of the cardiopericardial silhouette, similar to prior. Underlying pericardial effusion not excluded. Electronically Signed   By: Davina Poke D.O.   On: 05/21/2022 15:14    Impression: 81 y.o. year old male with medical history significant for atrial fibrillation on chronic anticoagulation, systolic CHF, hypertension, diabetes, who presented to the emergency room 8/7 after having follow-up with cardiology with CBC showing hemoglobin of 7.6, down from 13.1, 6 months ago, associated  fatigue and SOB. In the ED, he was found to be heme positive. Also found to have possible pneumonia on CXR. He was started on antibiotics and GI consulted for anemia.   Symptomatic anemia: Hemoglobin 7.8 on admission, down from 13.1, 6 months ago.  Stool heme positive.  BUN elevated at 25 on admission creatinine also elevated at 1.47.  Now s/p 1 unit PRBCs with hemoglobin improved to 8.5. He reports occasional streaks of black in his stools, but no solid black stools aside from when taking Pepto-Bismol.  No BRBPR.  He has noted intermittent lower abdominal discomfort and constipation/change in bowel habits over the last few months, now taking 2 stool softeners daily.  Also notes some increased belching recently and intermittent epigastric burning for which she will use Tums though this is not routine.  No other significant GI symptoms.  No unintentional weight loss.  Recently started taking 2 Advil nightly, 2 weeks ago.  Last colonoscopy in August 2018 with tubular adenomas, recommended 5-year surveillance.  No prior EGD. Last dose of Eliquis was morning of 8/7.  Differential of acute, symptomatic anemia is broad including gastritis, duodenitis, PUD, small bowel AVMs, colon polyps, and can't rule out malignancy. I have recommended EGD and colonoscopy to further evaluate his significant decline in hemoglobin, heme positive stool, and change in bowel habits.    Regarding his pneumonia, he is on ceftriaxone and doxycycline reports he is feeling improved today.  No longer requiring supplemental O2.  Plan: EGD + colonoscopy with propofol with Dr. Gala Romney tomorrow.  The risks, benefits, and alternatives have been discussed with the patient in detail. The patient states understanding and desires to proceed.  Continue to hold Eliquis. (Last dose morning of 8/7) Clear liquid diet today. N.p.o. at midnight. IV PPI twice daily. Continue to monitor H&H, transfuse as needed. Monitor for overt GI  bleeding. Continue antibiotics per hospitalist for pneumonia.   LOS: 0 days    05/22/2022, 7:39 AM   Aliene Altes, PA-C [facility]

## 2022-05-22 NOTE — H&P (View-Only) (Signed)
$'@LOGO'm$ @   Referring Provider: [facility] hospitalist Primary Care Physician:  [name], MD Primary Gastroenterologist:  Dr. Laural Golden previously  Date of Admission:  Date of Consultation:   Reason for Consultation: Acute symptomatic anemia, on chronic anticoagulation  HPI:  Lucas Dudley is a 81 y.o. year old male with medical history significant for atrial fibrillation on chronic anticoagulation, systolic CHF, hypertension, diabetes, who presented to the emergency room 8/7 after having follow-up with cardiology with CBC showing hemoglobin of 7.6, down from 13.1, 6 months ago.  Patient reported associated fatigue over the last few weeks and difficulty breathing.  ED course: Vital signs stable aside from O2 saturation 89% on room air, placed on 2 L nasal cannula with improvement. Repeat hemoglobin 7.8. Stool heme positive. BUN elevated at 25, creatinine elevated at 1.47. Chest x-ray completed by cardiologist earlier same day showed small right pleural effusion with streaky right base opacity which may represent atelectasis or pneumonia.  1 unit PRBCs ordered.  He was started on IV PPI twice daily, Eliquis placed on hold.  Also started on Rocephin and doxycycline for pneumonia.  Repeat hemoglobin this morning 8.5.  BUN increased to 28, creatinine improved to 1.34.  Consult:  Noticed increased fatigue and shortness of breath for the last few weeks. Thought it was his A fib. No brbpr. Occasional black streaks in his stools, but only noticed a completely black stool after taking pepto bismol. Reports few months of lower abdominal discomfort and constipation. Taking 2 stool softeners. Was taking 1 daily, but this wasn't helping. Has also tried mineral oil. Used to have a BM every day. Bowels are moving every 2-3 days. Stools are mushy. First part of the stool is hard. May have to sit for 45 minutes. Lower abdominal pain improves with a BM.  Had a hemorrhoid operation years ago.   No upper  abdominal pain, nausea, vomiting. No heartburn, but has been burping a lot. No dysphagia. Every now and then will have burning in the epigastric area which he will take tums. This is not routine.   Started Ozempic a few months ago.   Started Advil, 2 nightly, 2 weeks ago.   No weight loss.   Last dose of Eliquis: Morning of 8/7.   SOB improved. Little increased cough recently. No fever.    No prior EGD.   Last colonoscopy in August 2018 with 2 polyps in the transverse and ascending colon biopsied, diverticulosis in sigmoid colon, external hemorrhoids.  Pathology with tubular adenomas, recommended 5-year surveillance.  Past Medical History:  Diagnosis Date   Arthritis    Atrial fibrillation (HCC)    CAD (coronary artery disease)    a. Cath 03/17/15 showing 100% ostial D1, 50% prox LAD to mid LAD, 40% RPDA stenosis. Med rx. // Myoview 01/2020: EF 31 diffuse perfusion defect without reversibility (suspect artifact); reviewed with Dr. 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[place])    OSA on CPAP 2012   Prostate cancer ([place])    Type II diabetes mellitus ([place])  Past Surgical History:  Procedure Laterality Date   ABDOMINAL HERNIA REPAIR     w/mesh   ATRIAL FIBRILLATION ABLATION N/A 06/06/2021   Procedure: ATRIAL FIBRILLATION ABLATION;  Surgeon: Thompson Grayer, MD;  Location: [place] [facility];  Service: Cardiovascular;  Laterality: N/A;   CARDIAC CATHETERIZATION N/A 03/17/2015   Procedure: Left Heart Cath and Coronary Angiography;  Surgeon: Lorretta Harp, MD;  Location: [place] [facility];  Service: Cardiovascular;  Laterality: N/A;   CARDIOVERSION N/A  08/09/2021   Procedure: CARDIOVERSION;  Surgeon: [name], MD;  Location: [place];  Service: Cardiovascular;  Laterality: N/A;   CARDIOVERSION N/A 11/10/2021   Procedure: CARDIOVERSION;  Surgeon: Berniece Salines, DO;  Location: [place];  Service: Cardiovascular;  Laterality: N/A;   carotid doppler  09/17/2008   rigt and left ICAs 0-49%;mildly  abnormal   CATARACT EXTRACTION Left 2020   Dr. Kathlen Mody   COLONOSCOPY N/A 05/16/2017   Procedure: COLONOSCOPY;  Surgeon: Rogene Houston, MD;  [facility];  Service: Endoscopy;  Laterality: N/A;  [place]  05/25/2009   EF 50-55%,LA mildly dilated, LV function normal   ELECTROPHYSIOLOGIC STUDY N/A 04/05/2015   Procedure: Cardioversion;  Surgeon: Sanda Klein, MD;  Location: [place] [facility];  Service: Cardiovascular;  Laterality: N/A;   ELECTROPHYSIOLOGIC STUDY N/A 09/06/2015   Procedure: Atrial Fibrillation Ablation;  Surgeon: Thompson Grayer, MD;  Location: [place] [facility];  Service: Cardiovascular;  Laterality: N/A;   ELECTROPHYSIOLOGIC STUDY N/A 07/12/2016   redo afib ablation by Dr Rayann Heman   EXCISIONAL HEMORRHOIDECTOMY     "inside and out"   EYE SURGERY Left 2020   Cat Sx - Dr. Kathlen Mody   FINE NEEDLE ASPIRATION Right    knee; "drew ~ 1 quart off"   [place] Right    "neck"   NM MYOCAR PERF WALL MOTION  02/21/2012   EF 61% ,EXERCISE 7 METS. exercise stopped due to wheezing and shortness of breathe   POLYPECTOMY  05/16/2017   Procedure: POLYPECTOMY;  Surgeon: Rogene Houston, MD;  [facility];  Service: Endoscopy;;  colon   PROSTATECTOMY     SHOULDER OPEN ROTATOR CUFF REPAIR Right X 2   TEE WITHOUT CARDIOVERSION N/A 09/05/2015   Procedure: TRANSESOPHAGEAL ECHOCARDIOGRAM (TEE);  Surgeon: Sueanne Margarita, MD;  Location: [facility];  Service: Cardiovascular;  Laterality: N/A;    Prior to Admission medications   Medication  Sig Start Date End Date Taking? Authorizing Provider  albuterol (VENTOLIN HFA) 108 (90 Base) MCG/ACT inhaler Inhale 2 puffs into the lungs every 4 (four) hours as needed for wheezing or shortness of breath. 10/27/21  Yes [provider]  amiodarone (PACERONE) 200 MG tablet TAKE 1 TABLET BY MOUTH EVERY DAY 12/21/21  Yes Sherran Needs, NP  amLODipine (NORVASC) 10 MG tablet Take 1 tablet (10 mg total) by mouth at bedtime. 09/27/21  Yes Allred, Jeneen Rinks, MD  carvedilol (COREG) 6.25 MG tablet Takes 1 tablet by mouth in the AM and 1 tablet by mouth in the PM 07/04/21  Yes Sherran Needs, NP  Cholecalciferol (VITAMIN D-3 PO) Take 1,000 mg by mouth daily.   Yes [provider]  COMBIGAN 0.2-0.5 % ophthalmic solution Apply 1 drop to eye 2 (two) times daily. 10/16/21  Yes [provider]  CRANBERRY PO Take 1 tablet by mouth daily.   Yes [provider]  diphenhydramine-acetaminophen (TYLENOL  PM) 25-500 MG TABS tablet Take 2 tablets by mouth at bedtime.   Yes [provider]  Docusate Calcium (STOOL SOFTENER PO) Take 200 mg by mouth daily.   Yes [provider]  ELIQUIS 5 MG TABS tablet TAKE 1 TABLET BY MOUTH TWICE A DAY 04/26/22  Yes Sherran Needs, NP  furosemide (LASIX) 40 MG tablet TAKE 1 TABLET BY MOUTH TWICE A DAY 10/11/21  Yes Sherran Needs, NP  gemfibrozil (LOPID) 600 MG tablet Take 600 mg by mouth at bedtime.    Yes [provider]  glimepiride (AMARYL) 2 MG tablet Take 2 mg by mouth daily with lunch.   Yes [provider]  Insulin Glargine (BASAGLAR KWIKPEN) 100 UNIT/ML Inject 35 Units into the skin at bedtime. Patient taking differently: Inject 30 Units into the skin at bedtime. 08/15/21  Yes Kathie Dike, MD  KLOR-CON M20 20 MEQ tablet TAKE 1 TABLET BY MOUTH EVERY DAY 12/21/21  Yes Sherran Needs, NP  lisinopril (ZESTRIL) 40 MG tablet Take 40 mg by mouth daily. 11/22/20  Yes [provider]  Melatonin 10 MG TABS Take  10 mg by mouth at bedtime.   Yes [provider]  metFORMIN (GLUCOPHAGE-XR) 500 MG 24 hr tablet Take 1,000 mg by mouth in the morning and at bedtime. 11/11/19  Yes [provider]  mineral oil liquid Take 15 mLs by mouth daily as needed for moderate constipation.   Yes [provider]  Multiple Vitamins-Minerals (MULTIVITAMIN WITH MINERALS) tablet Take 1 tablet by mouth daily. Centrum men 50+   Yes [provider]  Omega-3 1000 MG CAPS Take 1,000 mg by mouth daily.   Yes [provider]  OZEMPIC, 1 MG/DOSE, 4 MG/3ML SOPN Inject 1 mg into the skin once a week. 02/07/22  Yes [provider]  valACYclovir (VALTREX) 1000 MG tablet Take 1,000 mg by mouth at bedtime. 02/12/19  Yes [provider]    Current Facility-Administered Medications  Medication Dose Route Frequency Provider Last Rate Last Admin   acetaminophen (TYLENOL) tablet 650 mg  650 mg Oral Q6H PRN Emokpae, Ejiroghene E, MD   650 mg at 05/21/22 2241   Or   acetaminophen (TYLENOL) suppository 650 mg  650 mg Rectal Q6H PRN Emokpae, Ejiroghene E, MD       amiodarone (PACERONE) tablet 200 mg  200 mg Oral Daily Emokpae, Ejiroghene E, MD       amLODipine (NORVASC) tablet 10 mg  10 mg Oral QHS Emokpae, Ejiroghene E, MD   10 mg at 05/21/22 2157   brimonidine-timolol (COMBIGAN) 0.2-0.5 % ophthalmic solution 1 drop  1 drop Ophthalmic BID Emokpae, Ejiroghene E, MD       carvedilol (COREG) tablet 6.25 mg  6.25 mg Oral BID WC Emokpae, Ejiroghene E, MD       cefTRIAXone (ROCEPHIN) 2 g in sodium chloride 0.9 % 100 mL IVPB  2 g Intravenous Q24H Emokpae, Ejiroghene E, MD   Stopped at 05/21/22 1843   doxycycline (VIBRAMYCIN) 100 mg in sodium chloride 0.9 % 250 mL IVPB  100 mg Intravenous Q12H Emokpae, Ejiroghene E, MD   Stopped at 05/22/22 0139   furosemide (LASIX) tablet 40 mg  40 mg Oral BID Emokpae, Ejiroghene E, MD       gemfibrozil (LOPID) tablet 600 mg  600 mg Oral QHS Emokpae, Ejiroghene  E, MD   600 mg at 05/21/22 2159   guaiFENesin-dextromethorphan (ROBITUSSIN DM) 100-10 MG/5ML syrup 10 mL  10 mL Oral  Q6H PRN Emokpae, Ejiroghene E, MD       insulin aspart (novoLOG) injection 0-15 Units  0-15 Units Subcutaneous Q6H Emokpae, Ejiroghene E, MD   3 Units at 05/21/22 2158   lisinopril (ZESTRIL) tablet 40 mg  40 mg Oral Daily Emokpae, Ejiroghene E, MD       melatonin tablet 6 mg  6 mg Oral QHS Emokpae, Ejiroghene E, MD   6 mg at 05/21/22 2317   ondansetron (ZOFRAN) tablet 4 mg  4 mg Oral Q6H PRN Emokpae, Ejiroghene E, MD       Or   ondansetron (ZOFRAN) injection 4 mg  4 mg Intravenous Q6H PRN Emokpae, Ejiroghene E, MD       pantoprazole (PROTONIX) injection 40 mg  40 mg Intravenous Q12H Emokpae, Ejiroghene E, MD   40 mg at 05/21/22 2317   polyethylene glycol (MIRALAX / GLYCOLAX) packet 17 g  17 g Oral BID Emokpae, Ejiroghene E, MD   17 g at 05/21/22 2157   valACYclovir (VALTREX) tablet 1,000 mg  1,000 mg Oral QHS Emokpae, Ejiroghene E, MD   1,000 mg at 05/21/22 2157    Allergies as of 05/21/2022 - Review Complete 05/21/2022  Allergen Reaction Noted   Azithromycin Nausea Only 11/10/2021   Tape Rash and Other (See Comments) 01/28/2012    Family History  Problem Relation Age of Onset   Heart disease Mother    Lung cancer Mother    Heart attack Mother 2   Stroke Brother    Healthy Daughter    Diabetes Father    Heart disease Father     Social History   Socioeconomic History   Marital status: Married    Spouse name: Not on file   Number of children: Not on file   Years of education: Not on file   Highest education level: Not on file  Occupational History   Occupation: Retired  Tobacco Use   Smoking status: Former    Packs/day: 2.00    Years: 27.00    Total pack years: 54.00    Types: Cigarettes    Quit date: 10/31/1983    Years since quitting: 38.5   Smokeless tobacco: Never   Tobacco comments:    Former smoker 09/27/21  Vaping Use   Vaping Use: Never used   Substance and Sexual Activity   Alcohol use: No    Alcohol/week: 0.0 standard drinks of alcohol    Comment: "used to drink; stopped ~ 2008"   Drug use: No   Sexual activity: Not on file  Other Topics Concern   Not on file  Social History Narrative   Lives in [place], [place] with wife.   Social Determinants of Health   Financial Resource Strain: Not on file  Food Insecurity: Not on file  Transportation Needs: Not on file  Physical Activity: Not on file  Stress: Not on file  Social Connections: Not on file  Intimate Partner Violence: Not on file    Review of Systems: Gen: Denies fever, chills, pre-syncope, or syncope.  CV: Denies chest pain, heart palpitations. Resp: Denies shortness of breath, cough.  GI: See HPI GU : Denies urinary burning, urinary frequency, urinary incontinence.  MS: Denies joint pain,swelling, cramping Derm: Denies rash. Psych: Denies depression, anxiety. Heme: See HPI  Physical Exam: Vital signs in last 24 hours: Temp:  [97.9 F (36.6 C)-100.4 F (38 C)] 98.3 F (36.8 C) (08/08 0243) Pulse Rate:  [55-68] 68 (08/08 0243) Resp:  [14-20] 19 (08/08 0243) BP: (122-143)/(41-71)  122/41 (08/08 0243) SpO2:  [89 %-99 %] 97 % (08/08 0243) Weight:  [166 kg-112.3 kg] 112 kg (08/07 1418)   General:   Alert,  Well-developed, well-nourished, pleasant and cooperative in NAD Head:  Normocephalic and atraumatic. Eyes:  Sclera clear, no icterus.   Conjunctiva pink. Ears:  Normal auditory acuity. Lungs:  Clear throughout to auscultation.   No wheezes, crackles, or rhonchi. No acute distress. Heart:  Regular rate and rhythm; no murmurs, clicks, rubs,  or gallops. Abdomen:  Soft and nondistended. Mild TTP in LLQ. No masses, hepatosplenomegaly or hernias noted. Normal bowel sounds, without guarding, and without rebound.   Rectal:  Deferred.  Msk:  Symmetrical without gross deformities. Normal posture. Extremities:  With trace LE edema. Neurologic:  Alert and  oriented  x4;  grossly normal neurologically. Skin:  Intact without significant lesions or rashes. Psych:  Normal mood and affect.  Intake/Output from previous day: 08/07 0701 - 08/08 0700 In: 897.6 [P.O.:240; Blood:315; IV Piggyback:342.6] Out: 1500 [Urine:1500] Intake/Output this shift: No intake/output data recorded.  Lab Results: Recent Labs    05/21/22 1221 05/21/22 1449 05/22/22 0005  WBC 10.0 10.5 9.6  HGB 7.6* 7.8* 8.6*  8.5*  HCT 26.0* 24.3* 29.1*  28.0*  PLT 305 331 299   BMET Recent Labs    05/21/22 1221 05/21/22 1449 05/22/22 0005  NA 137 139 136  K 4.1 4.2 3.6  CL 99 99 98  CO2 '25 28 28  '$ GLUCOSE 138* 185* 188*  BUN 20 25* 28*  CREATININE 1.21 1.47* 1.34*  CALCIUM 8.3* 8.1* 7.7*   LFT Recent Labs    05/21/22 1221  PROT 6.9  ALBUMIN 3.4*  AST 17  ALT 14  ALKPHOS 64  BILITOT 0.7    Studies/Results: DG Chest 2 View  Result Date: 05/21/2022 CLINICAL DATA:  Shortness of breath EXAM: CHEST - 2 VIEW COMPARISON:  03/13/2022 FINDINGS: Globular enlargement of the cardiopericardial silhouette, similar in appearance to prior. Aortic atherosclerosis. Chronic elevation of the right hemidiaphragm with streaky right basilar opacity. Small right pleural effusion. No pneumothorax. Minimally displaced subacute right ninth and tenth rib fractures. IMPRESSION: 1. Small right pleural effusion with streaky right basilar opacity, which may represent atelectasis versus pneumonia. 2. Globular enlargement of the cardiopericardial silhouette, similar to prior. Underlying pericardial effusion not excluded. Electronically Signed   By: Davina Poke D.O.   On: 05/21/2022 15:14    Impression: 81 y.o. year old male with medical history significant for atrial fibrillation on chronic anticoagulation, systolic CHF, hypertension, diabetes, who presented to the emergency room 8/7 after having follow-up with cardiology with CBC showing hemoglobin of 7.6, down from 13.1, 6 months ago, associated  fatigue and SOB. In the ED, he was found to be heme positive. Also found to have possible pneumonia on CXR. He was started on antibiotics and GI consulted for anemia.   Symptomatic anemia: Hemoglobin 7.8 on admission, down from 13.1, 6 months ago.  Stool heme positive.  BUN elevated at 25 on admission creatinine also elevated at 1.47.  Now s/p 1 unit PRBCs with hemoglobin improved to 8.5. He reports occasional streaks of black in his stools, but no solid black stools aside from when taking Pepto-Bismol.  No BRBPR.  He has noted intermittent lower abdominal discomfort and constipation/change in bowel habits over the last few months, now taking 2 stool softeners daily.  Also notes some increased belching recently and intermittent epigastric burning for which she will use Tums though this is not routine.  No other significant GI symptoms.  No unintentional weight loss.  Recently started taking 2 Advil nightly, 2 weeks ago.  Last colonoscopy in August 2018 with tubular adenomas, recommended 5-year surveillance.  No prior EGD. Last dose of Eliquis was morning of 8/7.  Differential of acute, symptomatic anemia is broad including gastritis, duodenitis, PUD, small bowel AVMs, colon polyps, and can't rule out malignancy. I have recommended EGD and colonoscopy to further evaluate his significant decline in hemoglobin, heme positive stool, and change in bowel habits.    Regarding his pneumonia, he is on ceftriaxone and doxycycline reports he is feeling improved today.  No longer requiring supplemental O2.  Plan: EGD + colonoscopy with propofol with Dr. Gala Romney tomorrow.  The risks, benefits, and alternatives have been discussed with the patient in detail. The patient states understanding and desires to proceed.  Continue to hold Eliquis. (Last dose morning of 8/7) Clear liquid diet today. N.p.o. at midnight. IV PPI twice daily. Continue to monitor H&H, transfuse as needed. Monitor for overt GI  bleeding. Continue antibiotics per hospitalist for pneumonia.   LOS: 0 days    05/22/2022, 7:39 AM   Aliene Altes, PA-C [facility]

## 2022-05-22 NOTE — TOC Initial Note (Signed)
Transition of Care [facility]) - Initial/Assessment Note    Patient Details  Name: Lucas Dudley MRN: [id number] Date of Birth: [birth year]  Transition of Care [facility]) CM/SW Contact:    Boneta Lucks, RN Phone Number: 05/22/2022, 12:45 PM  Clinical Narrative:     Patient admitted with acute anemia. TOC spoke with his daugher. Patient lives home alone, walks without assistance of any aids,drives himself to appointments. Family checks on him regularly. At this time no new needs. Patient is having further testing tomorrow. Daughter want to wait until closer to discharge to see if he will need home health. TOC to follow.    Expected Discharge Plan: [place] Barriers to Discharge: Continued Medical Work up   Patient Goals and CMS Choice   CMS Medicare.gov Compare Post Acute Care list provided to:: Patient Represenative (must comment) Choice offered to / list presented to : Adult Children  Expected Discharge Plan and Services Expected Discharge Plan: [place] arrangements for the past 2 months: Single Family Home                    Prior Living Arrangements/Services Living arrangements for the past 2 months: Single Family Home Lives with:: Self          Activities of Daily Living Home Assistive Devices/Equipment: Dentures (specify type), Eyeglasses, CBG Meter, CPAP ADL Screening (condition at time of admission) Patient's cognitive ability adequate to safely complete daily activities?: Yes Is the patient deaf or have difficulty hearing?: Yes Does the patient have difficulty seeing, even when wearing glasses/contacts?: No Does the patient have difficulty concentrating, remembering, or making decisions?: Yes Patient able to express need for assistance with ADLs?: Yes Does the patient have difficulty dressing or bathing?: No Independently performs ADLs?: Yes (appropriate for developmental age) Does the patient have difficulty walking or  climbing stairs?: Yes Weakness of Legs: None Weakness of Arms/Hands: None  Permission Sought/Granted           Permission granted to share info w Relationship: Daughter     Emotional Assessment    Alcohol / Substance Use: Not Applicable Psych Involvement: No (comment)  Admission diagnosis:  Symptomatic anemia [D64.9] Acute anemia [D64.9] Patient Active Problem List   Diagnosis Date Noted   Heme positive stool    Change in bowel habits    Constipation    Acute anemia 05/21/2022   Pneumonia 05/21/2022   Secondary hypercoagulable state ([place]) 09/18/2021   CHF exacerbation ([place]) 08/09/2021   Acute respiratory failure with hypoxia ([place]) 08/09/2021   History of colonic polyps 01/22/2017   Family hx of colorectal cancer 01/22/2017   A-fib ([place]) 07/12/2016   Acute on chronic combined systolic and diastolic CHF (congestive heart failure) ([place]) 05/17/2016   CAD in native artery 05/17/2016   PAF (paroxysmal atrial fibrillation) ([place]) 09/32/3557   Chronic systolic dysfunction of left ventricle 07/02/2015   Cardiomyopathy, dilated, nonischemic ([place]) 05/04/2015   OSA (obstructive sleep apnea) 04/25/2015   Persistent atrial fibrillation ([place]) 04/04/2015   Chronic anticoagulation    Abnormal stress test    Shortness of breath 01/18/2015   Paroxysmal atrial fibrillation ([place]) 07/06/2014   Benign essential HTN 06/14/2014   Calculus of kidney 06/14/2014   Diabetes mellitus with no complication ([place]) 32/20/2542   Right flank pain 06/14/2014   Blood in the urine 06/14/2014   CA of prostate ([place]) 06/14/2014   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

## 2022-05-22 NOTE — Progress Notes (Signed)
Patient decided to wear CPAP after all. Set patient up with hospital unit with small nasal mask. Patient doing well at this time.

## 2022-05-22 NOTE — Progress Notes (Signed)
Pt received one unit of PRBCs, tolerated well. VS wnl, no distress noted. Call bell in reach. Bed in lowest position.

## 2022-05-22 NOTE — Progress Notes (Signed)
Aliene Altes, PA ,with gastro, advised that if patient has finished bowel prep and is not clear by midnight that nurse may contact hospitalist and have him order half of an additional prep and to be npo after 0400 if has to drink the additional prep.  Patient has currently drunk about 1/3 of original bottle and has had no bm

## 2022-05-22 NOTE — Progress Notes (Signed)
On Progress Note   Patient: Lucas Dudley FUX:[id number] DOB: 24-May-1941 DOA: 05/21/2022     0 DOS: the patient was seen and examined on 05/22/2022   Brief hospital admission course: As per H&P written by Dr. Denton Brick on 05/21/2022  RONAN DUECKER is a 81 y.o. male with medical history significant for atrial fibrillation on chronic anticoagulation, systolic CHF, hypertension, diabetes. Patient went to see his cardiologist today, had blood work drawn, which showed hemoglobin of 7.6, was referred to the ED.  He denies abdominal pain, no vomiting.  He does not look at his stool she is unsure if he has had any melena.  He is on Eliquis, and his last dose was this morning.  Reports fatigue over the past few weeks, and difficulty breathing. Also reports a cough productive of brownish sputum over the past few days, congestion.  No fevers or chills.   ED Course: Temp 97.9.  Heart rate 60s.  Respiratory 14-18.  Blood pressure 126-134.  O2 sats 89% on room air, placed on 2 L sats now 96%. Hemoglobin 7.8.  Chest x-ray done earlier today by cardiology showed small right pleural effusion with streaky right base opacity which may represent atelectasis or pneumonia. GI consulted.  1 units PRBC ordered.  Assessment and Plan: * Acute anemia -Acute symptomatic anemia, hemoglobin down to 7.8, 12-13 last checked about 6 months ago. -Patient is chronically on Eliquis and reported no frank overt bleeding appreciated.  -Fecal occult blood test was positive.  -After discussing with gastroenterology service plan is for endoscopy and colonoscopy on 05/23/2022 after giving 48 hours for Eliquis washout.  -Continue to follow hemoglobin trend -Continue PPI -N.p.o. after midnight.    Pneumonia -Symptomatic with productive cough with dyspnea at time of admission (which may be secondary to pneumonia versus anemia).   -Sepsis rule out  -Normal WBCs currently  -No requiring oxygen supplementation  -Reports still intermittent  coughing spells (nonproductive).   -Follow final culture results; continue current antibiotics and start flutter valve.   -Continue as needed mucolytic/antitussive medication. -Patient expressed dyspnea is better already after transfusion.  Acute respiratory failure with hypoxia (HCC) -O2 sats down to 89% on room air at time of admission -Chest x-ray suggested pneumonia versus atelectasis -Decrease in patient's oxygen saturation could be related with anemia. -After transfusion good saturation appreciated on room air and patient expressing improvement in his shortness of breath symptoms. -Empirically receiving treatment for possible bronchitis/pneumonia. -Follow clinical response  Obesity, Class III, BMI 40-49.9 (morbid obesity) ([place]) -Body mass index is 36.48 kg/m. -Low calorie diet, portion control and increase physical activity discussed with patient.  A-fib ([place]) -Rate controlled, on amiodarone and carvedilol  -Chronically on Eliquis for secondary prevention.   -Continue holding Eliquis at this moment. -Follow GI service clearance for resumption.   -Continue telemetry monitoring.    Chronic systolic dysfunction of left ventricle -Stable and overall compensated.   -BNP stable at 166.  -Last echo 07/2021, EF of 60 to 65% with indeterminate LV diastolic parameters. -Continue beta-blocker and current dose of Lasix. -Follow daily weights and strict I's and O's.  OSA (obstructive sleep apnea) -Continue the use of CPAP nightly  Diabetes mellitus with no complication ([place]) -Reports history of hypoglycemia, with recent insulin dose adjusted by PCP. -Holding oral hypoglycemic agents and Ozempic while inpatient -Continue sliding scale insulin -Follow CBGs and further adjust hypoglycemic management as needed. -A1c 8.0  Essential hypertension - Stable overall -Continue current antihypertensive regimen. -Heart healthy diet discussed with  patient.    Subjective:  No chest pain,  no nausea vomiting.  No overt bleeding appreciated.  Patient reports feeling tired and fatigue and being hungry.  Intermittent nonproductive coughing spells appreciated throughout examination.  Physical Exam: Vitals:   05/22/22 0023 05/22/22 0243 05/22/22 0818 05/22/22 1431  BP:  (!) 122/41 119/63 129/60  Pulse: 66 68 68 64  Resp: '17 19  20  '$ Temp:  98.3 F (36.8 C)  97.8 F (36.6 C)  TempSrc:    Oral  SpO2: 96% 97%  94%  Weight:      Height:       General exam: Alert, awake, oriented x 3; no chest pain, no nausea, no vomiting.  Reports ongoing intermittent coughing spells and chills feeling tired and fatigued.  No overt bleeding appreciated. Respiratory system: Positive scattered rhonchi appreciated bilaterally; no using accessory muscles.  Good saturation on room air. Cardiovascular system: Rate controlled, no rubs, no gallops, no JVD. Gastrointestinal system: Abdomen is obese, nondistended, soft and nontender on palpation. No organomegaly or masses felt. Normal bowel sounds heard. Central nervous system: Alert and oriented. No focal neurological deficits. Extremities: No cyanosis or clubbing. Skin: No petechiae. Psychiatry: Judgement and insight appear normal. Mood & affect appropriate.   Data Reviewed: Basic metabolic panel: Sodium 615, potassium 3.6, chloride 98, bicarb 28, BUN 28, creatinine 1.34, anion gap 10 and GFR 54 CBC: WBCs 9.6, hemoglobin 8.5>> 8.6 (on subsequently followed H&H), platelet count 299 K.  MCV 84.6  Family Communication: Daughter and granddaughter at bedside.  Disposition: Status is: Inpatient Remains inpatient appropriate because: Remains at high risk for bleeding given chronic anticoagulation, dropping hemoglobin at time of admission of over 5 g and is in need of endoscopic evaluation.   Planned Discharge Destination: Home   Author: Barton Dubois, MD 05/22/2022 5:40 PM  For on call review [email or web address].

## 2022-05-23 ENCOUNTER — Other Ambulatory Visit: Payer: Self-pay | Admitting: Internal Medicine

## 2022-05-23 DIAGNOSIS — D649 Anemia, unspecified: Secondary | ICD-10-CM | POA: Diagnosis not present

## 2022-05-23 LAB — GLUCOSE, CAPILLARY
Glucose-Capillary: 124 mg/dL — ABNORMAL HIGH (ref 70–99)
Glucose-Capillary: 80 mg/dL (ref 70–99)
Glucose-Capillary: 196 mg/dL — ABNORMAL HIGH (ref 70–99)
Glucose-Capillary: 167 mg/dL — ABNORMAL HIGH (ref 70–99)
Glucose-Capillary: 170 mg/dL — ABNORMAL HIGH (ref 70–99)

## 2022-05-23 MED ORDER — FUROSEMIDE 40 MG PO TABS
40.0000 mg | ORAL_TABLET | Freq: Once | ORAL | Status: AC
  Administered 2022-05-23: 40 mg via ORAL
  Filled 2022-05-23: qty 1

## 2022-05-23 MED ORDER — PEG 3350-KCL-NA BICARB-NACL 420 G PO SOLR
4000.0000 mL | Freq: Once | ORAL | Status: AC
  Administered 2022-05-23: 4000 mL via ORAL

## 2022-05-23 NOTE — Progress Notes (Signed)
PROGRESS NOTE    Lucas Dudley  UVO:[id number] DOB: 1941-03-18 DOA: 05/21/2022 PCP: [name], MD   Brief Narrative:    Lucas Dudley is a 81 y.o. male with medical history significant for atrial fibrillation on chronic anticoagulation, systolic CHF, hypertension, diabetes. Patient went to see his cardiologist today, had blood work drawn, which showed hemoglobin of 7.6, was referred to the ED. he has been admitted with acute anemia and upper and lower endoscopy pending per GI once prep is adequately completed.  Assessment & Plan:   Principal Problem:   Acute anemia Active Problems:   Pneumonia   Acute respiratory failure with hypoxia (HCC)   Essential hypertension   Diabetes mellitus with no complication (HCC)   OSA (obstructive sleep apnea)   Chronic systolic dysfunction of left ventricle   A-fib (HCC)   Obesity, Class III, BMI 40-49.9 (morbid obesity) (HCC)  Assessment and Plan:   Acute anemia currently stable -Acute symptomatic anemia, hemoglobin down to 7.8, 12-13 last checked about 6 months ago. -Patient is chronically on Eliquis and reported no frank overt bleeding appreciated.  -Fecal occult blood test was positive.  -After discussing with gastroenterology service plan is for endoscopy and colonoscopy on 05/23/2022 after giving 48 hours for Eliquis washout.  -Continue to follow hemoglobin trend -Continue PPI -Repeat n.p.o. after midnight.   -Recheck CBC in a.m.   Pneumonia -Symptomatic with productive cough with dyspnea at time of admission (which may be secondary to pneumonia versus anemia).   -Sepsis rule out  -Normal WBCs currently  -No requiring oxygen supplementation  -Reports still intermittent coughing spells (nonproductive).   -Follow final culture results; continue current antibiotics and start flutter valve.   -Continue as needed mucolytic/antitussive medication. -Patient expressed dyspnea is better already after transfusion.   Acute respiratory  failure with hypoxia (HCC) -O2 sats down to 89% on room air at time of admission -Chest x-ray suggested pneumonia versus atelectasis -Decrease in patient's oxygen saturation could be related with anemia. -After transfusion good saturation appreciated on room air and patient expressing improvement in his shortness of breath symptoms. -Empirically receiving treatment for possible bronchitis/pneumonia. -Follow clinical response   Obesity, Class III, BMI 40-49.9 (morbid obesity) ([place]) -Body mass index is 36.48 kg/m. -Low calorie diet, portion control and increase physical activity discussed with patient.   A-fib ([place]) -Rate controlled, on amiodarone and carvedilol  -Chronically on Eliquis for secondary prevention.   -Continue holding Eliquis at this moment until endoscopy completed 8/10 -Follow GI service clearance for resumption.   -Continue telemetry monitoring.     Chronic systolic dysfunction of left ventricle -Stable and overall compensated.   -BNP stable at 166.  -Last echo 07/2021, EF of 60 to 65% with indeterminate LV diastolic parameters. -Continue beta-blocker and current dose of Lasix. -Follow daily weights and strict I's and O's.   OSA (obstructive sleep apnea) -Continue the use of CPAP nightly   Diabetes mellitus with no complication ([place]) -Reports history of hypoglycemia, with recent insulin dose adjusted by PCP. -Holding oral hypoglycemic agents and Ozempic while inpatient -Continue sliding scale insulin -Follow CBGs and further adjust hypoglycemic management as needed. -A1c 8.0   Essential hypertension - Stable overall -Continue current antihypertensive regimen. -Heart healthy diet discussed with patient.    DVT prophylaxis: SCDs Code Status: Full Family Communication: None at bedside Disposition Plan:  Status is: Inpatient Remains inpatient appropriate because: Need for inpatient procedure   Consultants:  GI  Procedures:  None  Antimicrobials:   Anti-infectives (From  admission, onward)    Start     Dose/Rate Route Frequency Ordered Stop   05/21/22 2200  valACYclovir (VALTREX) tablet 1,000 mg        1,000 mg Oral Daily at bedtime 05/21/22 1859     05/21/22 2000  doxycycline (VIBRAMYCIN) 100 mg in sodium chloride 0.9 % 250 mL IVPB        100 mg 125 mL/hr over 120 Minutes Intravenous Every 12 hours 05/21/22 1845     05/21/22 1830  azithromycin (ZITHROMAX) 500 mg in sodium chloride 0.9 % 250 mL IVPB  Status:  Discontinued        500 mg 250 mL/hr over 60 Minutes Intravenous Every 24 hours 05/21/22 1731 05/21/22 1845   05/21/22 1800  cefTRIAXone (ROCEPHIN) 2 g in sodium chloride 0.9 % 100 mL IVPB        2 g 200 mL/hr over 30 Minutes Intravenous Every 24 hours 05/21/22 1731 05/26/22 1759       Subjective: Patient seen and evaluated today with no new acute complaints or concerns.  He did not have significant bowel movements with prep and therefore endoscopy has been moved to 8/10.  Objective: Vitals:   05/22/22 2152 05/22/22 2309 05/23/22 0419 05/23/22 1341  BP: 130/65  127/66 (!) 142/67  Pulse: (!) 59 (!) 59 70 71  Resp: '16 18 20 20  '$ Temp: (!) 97.3 F (36.3 C)  98.7 F (37.1 C) 98.4 F (36.9 C)  TempSrc:    Oral  SpO2: 95% 94% 97% 95%  Weight:      Height:        Intake/Output Summary (Last 24 hours) at 05/23/2022 1344 Last data filed at 05/23/2022 0900 Gross per 24 hour  Intake 600 ml  Output 475 ml  Net 125 ml   Filed Weights   05/21/22 1418  Weight: 112 kg    Examination:  General exam: Appears calm and comfortable  Respiratory system: Clear to auscultation. Respiratory effort normal. Cardiovascular system: S1 & S2 heard, RRR.  Gastrointestinal system: Abdomen is soft Central nervous system: Alert and awake Extremities: No edema Skin: No significant lesions noted Psychiatry: Flat affect.    Data Reviewed: I have personally reviewed following labs and imaging studies  CBC: Recent Labs  Lab  05/21/22 1221 05/21/22 1449 05/22/22 0005  WBC 10.0 10.5 9.6  HGB 7.6* 7.8* 8.6*  8.5*  HCT 26.0* 24.3* 29.1*  28.0*  MCV 85.0 83.5 84.6  PLT 305 331 935   Basic Metabolic Panel: Recent Labs  Lab 05/21/22 1221 05/21/22 1449 05/22/22 0005  NA 137 139 136  K 4.1 4.2 3.6  CL 99 99 98  CO2 '25 28 28  '$ GLUCOSE 138* 185* 188*  BUN 20 25* 28*  CREATININE 1.21 1.47* 1.34*  CALCIUM 8.3* 8.1* 7.7*   GFR: Estimated Creatinine Clearance: 55.1 mL/min (A) (by C-G formula based on SCr of 1.34 mg/dL (H)). Liver Function Tests: Recent Labs  Lab 05/21/22 1221  AST 17  ALT 14  ALKPHOS 64  BILITOT 0.7  PROT 6.9  ALBUMIN 3.4*   No results for input(s): "LIPASE", "AMYLASE" in the last 168 hours. No results for input(s): "AMMONIA" in the last 168 hours. Coagulation Profile: No results for input(s): "INR", "PROTIME" in the last 168 hours. Cardiac Enzymes: No results for input(s): "CKTOTAL", "CKMB", "CKMBINDEX", "TROPONINI" in the last 168 hours. BNP (last 3 results) No results for input(s): "PROBNP" in the last 8760 hours. HbA1C: Recent Labs    05/21/22 1449  HGBA1C 8.0*   CBG: Recent Labs  Lab 05/22/22 1115 05/22/22 1615 05/22/22 2228 05/23/22 0624 05/23/22 1126  GLUCAP 156* 124* 105* 124* 80   Lipid Profile: No results for input(s): "CHOL", "HDL", "LDLCALC", "TRIG", "CHOLHDL", "LDLDIRECT" in the last 72 hours. Thyroid Function Tests: Recent Labs    05/21/22 1221  TSH 3.037   Anemia Panel: No results for input(s): "VITAMINB12", "FOLATE", "FERRITIN", "TIBC", "IRON", "RETICCTPCT" in the last 72 hours. Sepsis Labs: No results for input(s): "PROCALCITON", "LATICACIDVEN" in the last 168 hours.  No results found for this or any previous visit (from the past 240 hour(s)).       Radiology Studies: No results found.      Scheduled Meds:  amiodarone  200 mg Oral Daily   amLODipine  10 mg Oral QHS   brimonidine  1 drop Both Eyes BID   carvedilol  6.25 mg Oral  BID WC   gemfibrozil  600 mg Oral QHS   insulin aspart  0-15 Units Subcutaneous Q6H   lisinopril  40 mg Oral Daily   melatonin  6 mg Oral QHS   pantoprazole (PROTONIX) IV  40 mg Intravenous Q12H   timolol  1 drop Both Eyes BID   valACYclovir  1,000 mg Oral QHS   Continuous Infusions:  cefTRIAXone (ROCEPHIN)  IV 2 g (05/22/22 1736)   doxycycline (VIBRAMYCIN) IV 100 mg (05/23/22 0910)     LOS: 1 day    Time spent: 35 minutes    Lucas Dudley Darleen Crocker, DO [facility]  If 7PM-7AM, please contact night-coverage [email or web address] 05/23/2022, 1:44 PM

## 2022-05-23 NOTE — Progress Notes (Addendum)
Two tap water enemas given this afternoon before scheduled endoscopy and both enemas yielded yellow output with small amount of loose brown stool in bottom of toilet bowl and one small solid floater.  Procedure postponed until tomorrow and started on another [place] this afternoon. Daughter is at bedside and Dr. Gala Romney talked with patient and daughter in room . Clear liquid diet ordered and to be npo after midnight

## 2022-05-24 ENCOUNTER — Encounter (HOSPITAL_COMMUNITY)
Admission: EM | Disposition: A | Discharge status: Home / Self Care | Payer: Self-pay | Source: Home / Self Care | Attending: Internal Medicine

## 2022-05-24 ENCOUNTER — Inpatient Hospital Stay (HOSPITAL_COMMUNITY): Payer: Medicare Other | Admitting: Anesthesiology

## 2022-05-24 ENCOUNTER — Encounter (HOSPITAL_COMMUNITY): Payer: Self-pay | Admitting: Family Medicine

## 2022-05-24 DIAGNOSIS — K5731 Diverticulosis of large intestine without perforation or abscess with bleeding: Secondary | ICD-10-CM

## 2022-05-24 DIAGNOSIS — K635 Polyp of colon: Secondary | ICD-10-CM | POA: Diagnosis not present

## 2022-05-24 DIAGNOSIS — R195 Other fecal abnormalities: Secondary | ICD-10-CM | POA: Diagnosis not present

## 2022-05-24 DIAGNOSIS — K449 Diaphragmatic hernia without obstruction or gangrene: Secondary | ICD-10-CM | POA: Diagnosis not present

## 2022-05-24 DIAGNOSIS — I2510 Atherosclerotic heart disease of native coronary artery without angina pectoris: Secondary | ICD-10-CM | POA: Diagnosis not present

## 2022-05-24 DIAGNOSIS — Z87891 Personal history of nicotine dependence: Secondary | ICD-10-CM

## 2022-05-24 DIAGNOSIS — K921 Melena: Secondary | ICD-10-CM

## 2022-05-24 DIAGNOSIS — D649 Anemia, unspecified: Secondary | ICD-10-CM | POA: Diagnosis not present

## 2022-05-24 HISTORY — PX: COLONOSCOPY WITH PROPOFOL: SHX5780

## 2022-05-24 HISTORY — PX: POLYPECTOMY: SHX5525

## 2022-05-24 HISTORY — PX: BIOPSY: SHX5522

## 2022-05-24 HISTORY — PX: ESOPHAGOGASTRODUODENOSCOPY (EGD) WITH PROPOFOL: SHX5813

## 2022-05-24 LAB — CBC
HCT: 28.9 % — ABNORMAL LOW (ref 39.0–52.0)
Hemoglobin: 8.5 g/dL — ABNORMAL LOW (ref 13.0–17.0)
MCH: 25 pg — ABNORMAL LOW (ref 26.0–34.0)
MCHC: 29.4 g/dL — ABNORMAL LOW (ref 30.0–36.0)
MCV: 85 fL (ref 80.0–100.0)
Platelets: 327 10*3/uL (ref 150–400)
RBC: 3.4 MIL/uL — ABNORMAL LOW (ref 4.22–5.81)
RDW: 15.9 % — ABNORMAL HIGH (ref 11.5–15.5)
WBC: 9 10*3/uL (ref 4.0–10.5)
nRBC: 0.2 % (ref 0.0–0.2)

## 2022-05-24 LAB — GLUCOSE, CAPILLARY
Glucose-Capillary: 133 mg/dL — ABNORMAL HIGH (ref 70–99)
Glucose-Capillary: 142 mg/dL — ABNORMAL HIGH (ref 70–99)
Glucose-Capillary: 146 mg/dL — ABNORMAL HIGH (ref 70–99)
Glucose-Capillary: 141 mg/dL — ABNORMAL HIGH (ref 70–99)
Glucose-Capillary: 142 mg/dL — ABNORMAL HIGH (ref 70–99)
Glucose-Capillary: 215 mg/dL — ABNORMAL HIGH (ref 70–99)

## 2022-05-24 LAB — BASIC METABOLIC PANEL
Anion gap: 8 (ref 5–15)
BUN: 11 mg/dL (ref 8–23)
CO2: 28 mmol/L (ref 22–32)
Calcium: 8.2 mg/dL — ABNORMAL LOW (ref 8.9–10.3)
Chloride: 103 mmol/L (ref 98–111)
Creatinine, Ser: 0.92 mg/dL (ref 0.61–1.24)
GFR, Estimated: >60 mL/min (ref >60)
Glucose, Bld: 144 mg/dL — ABNORMAL HIGH (ref 70–99)
Potassium: 3.5 mmol/L (ref 3.5–5.1)
Sodium: 139 mmol/L (ref 135–145)

## 2022-05-24 LAB — MAGNESIUM: Magnesium: 1.4 mg/dL — ABNORMAL LOW (ref 1.7–2.4)

## 2022-05-24 SURGERY — ESOPHAGOGASTRODUODENOSCOPY (EGD) WITH PROPOFOL
Anesthesia: General

## 2022-05-24 MED ORDER — PROPOFOL 10 MG/ML IV BOLUS
INTRAVENOUS | Status: DC | PRN
  Administered 2022-05-24: 110 mg via INTRAVENOUS

## 2022-05-24 MED ORDER — LACTATED RINGERS IV SOLN
INTRAVENOUS | Status: DC
Start: 2022-05-24 — End: 2022-05-24

## 2022-05-24 MED ORDER — SODIUM CHLORIDE 0.9 % IV SOLN: INTRAVENOUS | Status: DC

## 2022-05-24 MED ORDER — LIDOCAINE 2% (20 MG/ML) 5 ML SYRINGE
INTRAMUSCULAR | Status: DC | PRN
  Administered 2022-05-24: 50 mg via INTRAVENOUS

## 2022-05-24 MED ORDER — PROPOFOL 500 MG/50ML IV EMUL
INTRAVENOUS | Status: DC | PRN
  Administered 2022-05-24: 150 ug/kg/min via INTRAVENOUS

## 2022-05-24 MED ORDER — MAGNESIUM SULFATE 2 GM/50ML IV SOLN
2.0000 g | Freq: Once | INTRAVENOUS | Status: AC
Start: 2022-05-24 — End: 2022-05-24
  Administered 2022-05-24: 2 g via INTRAVENOUS
  Filled 2022-05-24: qty 50

## 2022-05-24 NOTE — Care Management Important Message (Signed)
Important Message  Patient Details  Name: Lucas Dudley MRN: [id number] Date of Birth: 1941/02/25   Medicare Important Message Given:  Yes (copy left in room)     [name] 05/24/2022, 1:44 PM

## 2022-05-24 NOTE — Op Note (Signed)
[facility] Patient Name: Lucas Dudley Procedure Date: 05/24/2022 3:12 PM MRN: [id number] Date of Birth: 26-Oct-1940 Attending MD: Norvel Richards , MD CSN: [id number] Age: 81 Admit Type: Inpatient Procedure:                Colonoscopy Indications:              Heme positive stool Providers:                Norvel Richards, MD, Lambert Mody,                            Crystal Page Referring MD:              Medicines:                Propofol per Anesthesia Complications:            No immediate complications. Estimated Blood Loss:     Estimated blood loss was minimal. Procedure:                Pre-Anesthesia Assessment:                           - Prior to the procedure, a History and Physical                            was performed, and patient medications and                            allergies were reviewed. The patient's tolerance of                            previous anesthesia was also reviewed. The risks                            and benefits of the procedure and the sedation                            options and risks were discussed with the patient.                            All questions were answered, and informed consent                            was obtained. Prior Anticoagulants: The patient has                            taken no previous anticoagulant or antiplatelet                            agents. ASA Grade Assessment: III - A patient with                            severe systemic disease. After reviewing the risks  and benefits, the patient was deemed in                            satisfactory condition to undergo the procedure.                           After obtaining informed consent, the colonoscope                            was passed under direct vision. Throughout the                            procedure, the patient's blood pressure, pulse, and                            oxygen saturations were monitored  continuously. The                            [id number]) scope was introduced through the                            anus and advanced to the the cecum, identified by                            appendiceal orifice and ileocecal valve. The                            colonoscopy was performed without difficulty. The                            patient tolerated the procedure well. The quality                            of the bowel preparation was adequate. Scope In: 3:36:18 PM Scope Out: 4:03:49 PM Scope Withdrawal Time: 0 hours 8 minutes 28 seconds  Total Procedure Duration: 0 hours 27 minutes 31 seconds  Findings:      The perianal and digital rectal examinations were normal. Redundant and       elongated colon requiring changing of the patient's position and       external abdominal pressure to reach the cecum.      A 5 mm polyp was found in the sigmoid colon. The polyp was sessile. The       polyp was removed with a cold snare. Resection and retrieval were       complete. Estimated blood loss was minimal.      Multiple small and large-mouthed diverticula were found in the       descending colon and splenic flexure.      The exam was otherwise without abnormality on direct and retroflexion       views. Please note, no blood or clot in the lower GI tract. Impression:               - One 5 mm polyp in the sigmoid colon, removed with  a cold snare. Resected and retrieved.                           - Diverticulosis in the descending colon and at the                            splenic flexure. Redundant and elongated colon.                           - The examination was otherwise normal on direct                            and retroflexion views. No blood or clot in the                            lower GI tract. Given negative evaluation today,                            small bowel evaluation warranted. Moderate Sedation:      Moderate (conscious)  sedation was personally administered by an       anesthesia professional. The following parameters were monitored: oxygen       saturation, heart rate, blood pressure, respiratory rate, EKG, adequacy       of pulmonary ventilation, and response to care. Recommendation:           - Clear liquid diet. Plan for video capsule                            endoscopy of the small bowel tomorrow. I discussed                            this approach with [name], patient's daughter,                            [phone]. I discussed the technical aspects of                            the procedure and the potential risk including                            capsule retention. Her questions were answered she                            is agreeable. Will plan to get the capsule deployed                            first thing tomorrow morning. See EGD report.                            Follow-up on pathology.                           - Return patient to hospital ward for ongoing care. Procedure Code(s):        ---  Professional ---                           [phone], Colonoscopy, flexible; with removal of                            tumor(s), polyp(s), or other lesion(s) by snare                            technique Diagnosis Code(s):        --- Professional ---                           K63.5, Polyp of colon                           R19.5, Other fecal abnormalities                           K57.30, Diverticulosis of large intestine without                            perforation or abscess without bleeding CPT copyright 2019 [employer]. All rights reserved. The codes documented in this report are preliminary and upon coder review may  be revised to meet current compliance requirements. Cristopher Estimable. Lynnel Zanetti, MD Norvel Richards, MD 05/24/2022 4:27:17 PM This report has been signed electronically. Number of Addenda: 0

## 2022-05-24 NOTE — Interval H&P Note (Signed)
History and Physical Interval Note:  05/24/2022 3:14 PM  Lucas Dudley  has presented today for surgery, with the diagnosis of Symptomatic anemia, heme positive stool, change in bowel habits, epigastric burning, intermittent dark stool.  The various methods of treatment have been discussed with the patient and family. After consideration of risks, benefits and other options for treatment, the patient has consented to  Procedure(s): ESOPHAGOGASTRODUODENOSCOPY (EGD) WITH PROPOFOL (N/A) COLONOSCOPY WITH PROPOFOL (N/A) as a surgical intervention.  The patient's history has been reviewed, patient examined, no change in status, stable for surgery.  I have reviewed the patient's chart and labs.  Questions were answered to the patient's satisfaction.      Patient stable overnight.  Hemoglobin 8.5 this morning.  Agree with need for both EGD and colonoscopy. The risks, benefits, limitations, imponderables and alternatives regarding both EGD and colonoscopy have been reviewed with the patient. Questions have been answered. All parties agreeable.     Manus Rudd

## 2022-05-24 NOTE — Progress Notes (Signed)
PROGRESS NOTE    Lucas Dudley  EXN:[id number] DOB: February 09, 1941 DOA: 05/21/2022 PCP: [name], MD   Brief Narrative:    Lucas Dudley is a 81 y.o. male with medical history significant for atrial fibrillation on chronic anticoagulation, systolic CHF, hypertension, diabetes. Patient went to see his cardiologist today, had blood work drawn, which showed hemoglobin of 7.6, was referred to the ED. he has been admitted with acute anemia and upper and lower endoscopy pending per GI once prep is adequately completed.  Assessment & Plan:   Principal Problem:   Acute anemia Active Problems:   Pneumonia   Acute respiratory failure with hypoxia (HCC)   Essential hypertension   Diabetes mellitus with no complication (HCC)   OSA (obstructive sleep apnea)   Chronic systolic dysfunction of left ventricle   A-fib (HCC)   Obesity, Class III, BMI 40-49.9 (morbid obesity) (HCC)  Assessment and Plan:   Acute anemia currently stable -Acute symptomatic anemia, hemoglobin down to 7.8, 12-13 last checked about 6 months ago. -Patient is chronically on Eliquis and reported no frank overt bleeding appreciated.  -Fecal occult blood test was positive.  -s/p EGD and colonoscopy 8/10 with plans for video capsule in am -Continue to follow hemoglobin trend -Continue PPI -Repeat n.p.o. after midnight.   -Recheck CBC in a.m.   Pneumonia -Symptomatic with productive cough with dyspnea at time of admission (which may be secondary to pneumonia versus anemia).   -Sepsis rule out  -Normal WBCs currently  -No requiring oxygen supplementation  -Reports still intermittent coughing spells (nonproductive).   -Follow final culture results; continue current antibiotics and start flutter valve.   -Continue as needed mucolytic/antitussive medication. -Patient expressed dyspnea is better already after transfusion.   Acute respiratory failure with hypoxia (HCC) -O2 sats down to 89% on room air at time of  admission -Chest x-ray suggested pneumonia versus atelectasis -Decrease in patient's oxygen saturation could be related with anemia. -After transfusion good saturation appreciated on room air and patient expressing improvement in his shortness of breath symptoms. -Empirically receiving treatment for possible bronchitis/pneumonia. -Follow clinical response   Obesity, Class III, BMI 40-49.9 (morbid obesity) ([place]) -Body mass index is 36.48 kg/m. -Low calorie diet, portion control and increase physical activity discussed with patient.   A-fib ([place]) -Rate controlled, on amiodarone and carvedilol  -Chronically on Eliquis for secondary prevention.   -Continue holding Eliquis at this moment until endoscopy completed 8/10 -Follow GI service clearance for resumption.   -Continue telemetry monitoring.     Chronic systolic dysfunction of left ventricle -Stable and overall compensated.   -BNP stable at 166.  -Last echo 07/2021, EF of 60 to 65% with indeterminate LV diastolic parameters. -Continue beta-blocker and current dose of Lasix. -Follow daily weights and strict I's and O's.   OSA (obstructive sleep apnea) -Continue the use of CPAP nightly   Diabetes mellitus with no complication ([place]) -Reports history of hypoglycemia, with recent insulin dose adjusted by PCP. -Holding oral hypoglycemic agents and Ozempic while inpatient -Continue sliding scale insulin -Follow CBGs and further adjust hypoglycemic management as needed. -A1c 8.0   Essential hypertension - Stable overall -Continue current antihypertensive regimen. -Heart healthy diet discussed with patient.     DVT prophylaxis: SCDs Code Status: Full Family Communication: None at bedside Disposition Plan:  Status is: Inpatient Remains inpatient appropriate because: Need for inpatient procedure     Consultants:  GI   Procedures:  EGD and colonoscopy 8/10    Antimicrobials:  Anti-infectives (From admission,  onward)     Start     Dose/Rate Route Frequency Ordered Stop   05/21/22 2200  [MAR Hold]  valACYclovir (VALTREX) tablet 1,000 mg        (MAR Hold since Thu 05/24/2022 at 1356.Hold Reason: Transfer to a Procedural area)   1,000 mg Oral Daily at bedtime 05/21/22 1859     05/21/22 2000  [MAR Hold]  doxycycline (VIBRAMYCIN) 100 mg in sodium chloride 0.9 % 250 mL IVPB        (MAR Hold since Thu 05/24/2022 at 1357.Hold Reason: Transfer to a Procedural area)   100 mg 125 mL/hr over 120 Minutes Intravenous Every 12 hours 05/21/22 1845     05/21/22 1830  azithromycin (ZITHROMAX) 500 mg in sodium chloride 0.9 % 250 mL IVPB  Status:  Discontinued        500 mg 250 mL/hr over 60 Minutes Intravenous Every 24 hours 05/21/22 1731 05/21/22 1845   05/21/22 1800  [MAR Hold]  cefTRIAXone (ROCEPHIN) 2 g in sodium chloride 0.9 % 100 mL IVPB        (MAR Hold since Thu 05/24/2022 at 1357.Hold Reason: Transfer to a Procedural area)   2 g 200 mL/hr over 30 Minutes Intravenous Every 24 hours 05/21/22 1731 05/26/22 1759       Subjective: Patient seen and evaluated today with no new acute complaints or concerns. No acute concerns or events noted overnight. He has completed his prep.  Objective: Vitals:   05/24/22 1345 05/24/22 1615 05/24/22 1619 05/24/22 1630  BP: (!) 143/63 (!) 121/96    Pulse: (!) 59 63 66   Resp: 19 (!) 23 18 (!) 23  Temp: 98.7 F (37.1 C) 97.7 F (36.5 C)    TempSrc: Oral     SpO2: 94% 97% 90%   Weight: 109.8 kg     Height: '5\' 10"'$  (1.778 m)       Intake/Output Summary (Last 24 hours) at 05/24/2022 1640 Last data filed at 05/24/2022 1607 Gross per 24 hour  Intake 2184.03 ml  Output 750 ml  Net 1434.03 ml   Filed Weights   05/21/22 1418 05/24/22 1345  Weight: 112 kg 109.8 kg    Examination:  General exam: Appears calm and comfortable  Respiratory system: Clear to auscultation. Respiratory effort normal. Cardiovascular system: S1 & S2 heard, RRR.  Gastrointestinal system: Abdomen is  soft Central nervous system: Alert and awake Extremities: No edema Skin: No significant lesions noted Psychiatry: Flat affect.    Data Reviewed: I have personally reviewed following labs and imaging studies  CBC: Recent Labs  Lab 05/21/22 1221 05/21/22 1449 05/22/22 0005 05/24/22 0503  WBC 10.0 10.5 9.6 9.0  HGB 7.6* 7.8* 8.6*  8.5* 8.5*  HCT 26.0* 24.3* 29.1*  28.0* 28.9*  MCV 85.0 83.5 84.6 85.0  PLT 305 331 299 701   Basic Metabolic Panel: Recent Labs  Lab 05/21/22 1221 05/21/22 1449 05/22/22 0005 05/24/22 0503  NA 137 139 136 139  K 4.1 4.2 3.6 3.5  CL 99 99 98 103  CO2 '25 28 28 28  '$ GLUCOSE 138* 185* 188* 144*  BUN 20 25* 28* 11  CREATININE 1.21 1.47* 1.34* 0.92  CALCIUM 8.3* 8.1* 7.7* 8.2*  MG  --   --   --  1.4*   GFR: Estimated Creatinine Clearance: 80.8 mL/min (by C-G formula based on SCr of 0.92 mg/dL). Liver Function Tests: Recent Labs  Lab 05/21/22 1221  AST 17  ALT 14  ALKPHOS 64  BILITOT  0.7  PROT 6.9  ALBUMIN 3.4*   No results for input(s): "LIPASE", "AMYLASE" in the last 168 hours. No results for input(s): "AMMONIA" in the last 168 hours. Coagulation Profile: No results for input(s): "INR", "PROTIME" in the last 168 hours. Cardiac Enzymes: No results for input(s): "CKTOTAL", "CKMB", "CKMBINDEX", "TROPONINI" in the last 168 hours. BNP (last 3 results) No results for input(s): "PROBNP" in the last 8760 hours. HbA1C: No results for input(s): "HGBA1C" in the last 72 hours. CBG: Recent Labs  Lab 05/23/22 2021 05/24/22 0551 05/24/22 0650 05/24/22 1152 05/24/22 1619  GLUCAP 170* 133* 142* 146* 141*   Lipid Profile: No results for input(s): "CHOL", "HDL", "LDLCALC", "TRIG", "CHOLHDL", "LDLDIRECT" in the last 72 hours. Thyroid Function Tests: No results for input(s): "TSH", "T4TOTAL", "FREET4", "T3FREE", "THYROIDAB" in the last 72 hours. Anemia Panel: No results for input(s): "VITAMINB12", "FOLATE", "FERRITIN", "TIBC", "IRON",  "RETICCTPCT" in the last 72 hours. Sepsis Labs: No results for input(s): "PROCALCITON", "LATICACIDVEN" in the last 168 hours.  No results found for this or any previous visit (from the past 240 hour(s)).       Radiology Studies: No results found.      Scheduled Meds:  [MAR Hold] amiodarone  200 mg Oral Daily   [MAR Hold] amLODipine  10 mg Oral QHS   [MAR Hold] brimonidine  1 drop Both Eyes BID   [MAR Hold] carvedilol  6.25 mg Oral BID WC   [MAR Hold] gemfibrozil  600 mg Oral QHS   [MAR Hold] insulin aspart  0-15 Units Subcutaneous Q6H   [MAR Hold] lisinopril  40 mg Oral Daily   [MAR Hold] melatonin  6 mg Oral QHS   [MAR Hold] pantoprazole (PROTONIX) IV  40 mg Intravenous Q12H   [MAR Hold] timolol  1 drop Both Eyes BID   [MAR Hold] valACYclovir  1,000 mg Oral QHS   Continuous Infusions:  sodium chloride     [MAR Hold] cefTRIAXone (ROCEPHIN)  IV 2 g (05/23/22 1849)   [MAR Hold] doxycycline (VIBRAMYCIN) IV 100 mg (05/24/22 1026)   lactated ringers 50 mL/hr at 05/24/22 1519     LOS: 2 days    Time spent: 35 minutes    Owynn Mosqueda D Manuella Ghazi, DO [facility]  If 7PM-7AM, please contact night-coverage [email or web address] 05/24/2022, 4:40 PM

## 2022-05-24 NOTE — Anesthesia Preprocedure Evaluation (Signed)
Anesthesia Evaluation  Patient identified by MRN, date of birth, ID band Patient awake    Reviewed: Allergy & Precautions, NPO status , Patient's Chart, lab work & pertinent test results  Airway Mallampati: II  TM Distance: >3 FB Neck ROM: Full    Dental  (+) Dental Advisory Given, Edentulous Upper, Missing   Pulmonary shortness of breath and with exertion, sleep apnea , pneumonia, resolved, former smoker,    Pulmonary exam normal breath sounds clear to auscultation       Cardiovascular hypertension, Pt. on medications + CAD and +CHF  Normal cardiovascular exam+ dysrhythmias Atrial Fibrillation  Rhythm:Regular Rate:Normal  1. Left ventricular ejection fraction, by estimation, is 60 to 65%. The left ventricle has normal function. Left ventricular endocardial border  not optimally defined to evaluate regional wall motion. There is mild left ventricular hypertrophy. Left  ventricular diastolic parameters are indeterminate.  2. Right ventricular systolic function is normal. The right ventricular size is normal. Tricuspid regurgitation signal is inadequate for assessing PA pressure.  3. The mitral valve is abnormal. Mild mitral valve regurgitation. No evidence of mitral stenosis.  4. The aortic valve was not well visualized. There is mild calcification of the aortic valve. There is mild thickening of the aortic valve. Aortic valve regurgitation is not visualized. No aortic stenosis is present.   Neuro/Psych negative neurological ROS  negative psych ROS   GI/Hepatic negative GI ROS, Neg liver ROS,   Endo/Other  diabetes, Well Controlled, Type 2, Oral Hypoglycemic Agents, Insulin Dependent  Renal/GU Renal disease Bladder dysfunction (prostate cancer)      Musculoskeletal  (+) Arthritis , Osteoarthritis,    Abdominal   Peds negative pediatric ROS (+)  Hematology  (+) Blood dyscrasia, anemia ,   Anesthesia Other  Findings The stress test has a lot of artifact. EF is low but was normal on recent echocardiogram. I reviewed with Dr. Meda Coffee. She reviewed the study and feels the it is low risk (ie - no significant change to suggest a significant blockage).  His HR was blunted on the treadmill and his BP did go very high.    PLAN:  - Decrease Carvedilol to 6.25 mg twice daily  - Start Amlodipine 5 mg once daily  - Keep follow up as planned  - Send Copy to PCP Richardson Dopp, PA-C   02/04/2020 10:22 AM   Reproductive/Obstetrics negative OB ROS                           Anesthesia Physical Anesthesia Plan  ASA: 3  Anesthesia Plan: General   Post-op Pain Management: Minimal or no pain anticipated   Induction: Intravenous  PONV Risk Score and Plan:   Airway Management Planned: Nasal Cannula and Natural Airway  Additional Equipment:   Intra-op Plan:   Post-operative Plan:   Informed Consent: I have reviewed the patients History and Physical, chart, labs and discussed the procedure including the risks, benefits and alternatives for the proposed anesthesia with the patient or authorized representative who has indicated his/her understanding and acceptance.     Dental advisory given  Plan Discussed with: CRNA and Surgeon  Anesthesia Plan Comments:        Anesthesia Quick Evaluation

## 2022-05-24 NOTE — Anesthesia Postprocedure Evaluation (Signed)
Anesthesia Post Note  Patient: Lucas Dudley  Procedure(s) Performed: ESOPHAGOGASTRODUODENOSCOPY (EGD) WITH PROPOFOL COLONOSCOPY WITH PROPOFOL BIOPSY POLYPECTOMY  Patient location during evaluation: PACU Anesthesia Type: General Level of consciousness: awake and alert and oriented Pain management: pain level controlled Vital Signs Assessment: post-procedure vital signs reviewed and stable Respiratory status: spontaneous breathing, nonlabored ventilation and respiratory function stable Cardiovascular status: blood pressure returned to baseline and stable Postop Assessment: no apparent nausea or vomiting Anesthetic complications: no   No notable events documented.   Last Vitals:  Vitals:   05/24/22 1630 05/24/22 1656  BP:  (!) 143/64  Pulse:  66  Resp: (!) 23 20  Temp:  36.7 C  SpO2:  93%    Last Pain:  Vitals:   05/24/22 1656  TempSrc: Oral  PainSc:                  Keenen Roessner C Antonin Meininger

## 2022-05-24 NOTE — Transfer of Care (Signed)
Immediate Anesthesia Transfer of Care Note  Patient: Lucas Dudley  Procedure(s) Performed: ESOPHAGOGASTRODUODENOSCOPY (EGD) WITH PROPOFOL COLONOSCOPY WITH PROPOFOL BIOPSY POLYPECTOMY  Patient Location: PACU  Anesthesia Type:General  Level of Consciousness: awake, sedated and drowsy  Airway & Oxygen Therapy: Patient Spontanous Breathing and Patient connected to nasal cannula oxygen  Post-op Assessment: Report given to RN and Post -op Vital signs reviewed and stable  Post vital signs: Reviewed and stable  Last Vitals:  Vitals Value Taken Time  BP 121/96 05/24/22 1615  Temp 97.7 05/24/22 1615  Pulse 67 05/24/22 1618  Resp 19 05/24/22 1618  SpO2 90 % 05/24/22 1618  Vitals shown include unvalidated device data.  Last Pain:  Vitals:   05/24/22 1535  TempSrc:   PainSc: 0-No pain         Complications: No notable events documented.

## 2022-05-24 NOTE — Op Note (Signed)
[facility] Patient Name: Lucas Dudley Procedure Date: 05/24/2022 3:12 PM MRN: [id number] Date of Birth: Aug 27, 1941 Attending MD: Norvel Richards , MD CSN: [id number] Age: 81 Admit Type: Inpatient Procedure:                Upper GI endoscopy Indications:              Heme positive stool, Melena Providers:                Norvel Richards, MD, Lambert Mody,                            Crystal Page Referring MD:              Medicines:                Propofol per Anesthesia Complications:            No immediate complications. Estimated Blood Loss:     Estimated blood loss was minimal. Procedure:                Pre-Anesthesia Assessment:                           - Prior to the procedure, a History and Physical                            was performed, and patient medications and                            allergies were reviewed. The patient's tolerance of                            previous anesthesia was also reviewed. The risks                            and benefits of the procedure and the sedation                            options and risks were discussed with the patient.                            All questions were answered, and informed consent                            was obtained. Prior Anticoagulants: The patient has                            taken no previous anticoagulant or antiplatelet                            agents. ASA Grade Assessment: III - A patient with                            severe systemic disease. After reviewing the risks  and benefits, the patient was deemed in                            satisfactory condition to undergo the procedure.                           After obtaining informed consent, the endoscope was                            passed under direct vision. Throughout the                            procedure, the patient's blood pressure, pulse, and                            oxygen saturations  were monitored continuously. The                            [id number]-[id number] ([id number]) scope was introduced through the                            mouth, and advanced to the second part of duodenum.                            The upper GI endoscopy was accomplished without                            difficulty. The patient tolerated the procedure                            well. Scope In: 3:26:53 PM Scope Out: 3:31:29 PM Total Procedure Duration: 0 hours 4 minutes 36 seconds  Findings:      The examined esophagus was normal.      A small hiatal hernia was present. Gastric cavity empty. Touch       friability of the gastric mucosa present. Couple of tiny erosions       involving the gastric body mucosa. No ulcer or infiltrating process.       Pylorus patent.      The duodenal bulb, second portion of the duodenum and third portion of       the duodenum were normal. Biopsies of the antrum and body taken for       histologic study Impression:               - Normal esophagus.                           - Small hiatal hernia. Friable gastric mucosa.                            Couple of tiny gastric erosions the significance of                            which is uncertain. Status post gastric biopsy                           -  Normal duodenal bulb, second portion of the                            duodenum and third portion of the duodenum. Moderate Sedation:      Moderate (conscious) sedation was personally administered by an       anesthesia professional. The following parameters were monitored: oxygen       saturation, heart rate, blood pressure, respiratory rate, EKG, adequacy       of pulmonary ventilation, and response to care. Recommendation:           - Patient has a contact number available for                            emergencies. The signs and symptoms of potential                            delayed complications were discussed with the                            patient. Return to  normal activities tomorrow.                            Written discharge instructions were provided to the                            patient.                           - Return patient to hospital ward for capsule                            evaluation in AM. See colonoscopy report.                           - Advance diet as tolerated.                           - Continue present medications. Procedure Code(s):        --- Professional ---                           [phone], Esophagogastroduodenoscopy, flexible,                            transoral; diagnostic, including collection of                            specimen(s) by brushing or washing, when performed                            (separate procedure) Diagnosis Code(s):        --- Professional ---                           K44.9, Diaphragmatic hernia without obstruction or  gangrene                           R19.5, Other fecal abnormalities                           K92.1, Melena (includes Hematochezia) CPT copyright 2019 [employer]. All rights reserved. The codes documented in this report are preliminary and upon coder review may  be revised to meet current compliance requirements. Cristopher Estimable. Kolette Vey, MD Norvel Richards, MD 05/24/2022 4:21:49 PM This report has been signed electronically. Number of Addenda: 0

## 2022-05-24 NOTE — Progress Notes (Signed)
Patient has not finished bowel prep for colonoscopy scheduled later today. I explained in depth with patient the reason for the prep and  importance of why it should  have been finished before now. Colonoscopy was already postponed from yesterday for the same reason. States he can not stomach anymore and he will just finish it in the morning. I explained that that was not going to work. Patients stools are watery but still green with small amount of particles.  Also, IV in [place] continues to beep while antibiotics are running. Nurse was going to start a new IV in forearm, patient refused, he said absolutely not.

## 2022-05-25 ENCOUNTER — Encounter (HOSPITAL_COMMUNITY): Payer: Self-pay | Admitting: Family Medicine

## 2022-05-25 ENCOUNTER — Inpatient Hospital Stay (HOSPITAL_COMMUNITY): Payer: Medicare Other

## 2022-05-25 ENCOUNTER — Encounter (HOSPITAL_COMMUNITY)
Admission: EM | Disposition: A | Discharge status: Home / Self Care | Payer: Self-pay | Source: Home / Self Care | Attending: Internal Medicine

## 2022-05-25 DIAGNOSIS — D649 Anemia, unspecified: Secondary | ICD-10-CM | POA: Diagnosis not present

## 2022-05-25 HISTORY — PX: GIVENS CAPSULE STUDY: SHX5432

## 2022-05-25 LAB — BASIC METABOLIC PANEL
Anion gap: 10 (ref 5–15)
BUN: 9 mg/dL (ref 8–23)
CO2: 25 mmol/L (ref 22–32)
Calcium: 8.6 mg/dL — ABNORMAL LOW (ref 8.9–10.3)
Chloride: 102 mmol/L (ref 98–111)
Creatinine, Ser: 0.87 mg/dL (ref 0.61–1.24)
GFR, Estimated: >60 mL/min (ref >60)
Glucose, Bld: 178 mg/dL — ABNORMAL HIGH (ref 70–99)
Potassium: 3.7 mmol/L (ref 3.5–5.1)
Sodium: 137 mmol/L (ref 135–145)

## 2022-05-25 LAB — CBC
HCT: 31.3 % — ABNORMAL LOW (ref 39.0–52.0)
Hemoglobin: 9 g/dL — ABNORMAL LOW (ref 13.0–17.0)
MCH: 24.5 pg — ABNORMAL LOW (ref 26.0–34.0)
MCHC: 28.8 g/dL — ABNORMAL LOW (ref 30.0–36.0)
MCV: 85.3 fL (ref 80.0–100.0)
Platelets: 365 10*3/uL (ref 150–400)
RBC: 3.67 MIL/uL — ABNORMAL LOW (ref 4.22–5.81)
RDW: 16.1 % — ABNORMAL HIGH (ref 11.5–15.5)
WBC: 14.7 10*3/uL — ABNORMAL HIGH (ref 4.0–10.5)
nRBC: 0.2 % (ref 0.0–0.2)

## 2022-05-25 LAB — GLUCOSE, CAPILLARY
Glucose-Capillary: 145 mg/dL — ABNORMAL HIGH (ref 70–99)
Glucose-Capillary: 154 mg/dL — ABNORMAL HIGH (ref 70–99)
Glucose-Capillary: 172 mg/dL — ABNORMAL HIGH (ref 70–99)
Glucose-Capillary: 191 mg/dL — ABNORMAL HIGH (ref 70–99)
Glucose-Capillary: 265 mg/dL — ABNORMAL HIGH (ref 70–99)

## 2022-05-25 LAB — MAGNESIUM: Magnesium: 1.9 mg/dL (ref 1.7–2.4)

## 2022-05-25 SURGERY — IMAGING PROCEDURE, GI TRACT, INTRALUMINAL, VIA CAPSULE

## 2022-05-25 MED ORDER — INSULIN ASPART 100 UNIT/ML IJ SOLN
0.0000 [IU] | Freq: Four times a day (QID) | INTRAMUSCULAR | Status: DC
  Administered 2022-05-25: 3 [IU] via SUBCUTANEOUS
  Administered 2022-05-25: 8 [IU] via SUBCUTANEOUS
  Administered 2022-05-26: 5 [IU] via SUBCUTANEOUS
  Administered 2022-05-26: 8 [IU] via SUBCUTANEOUS
  Administered 2022-05-26: 5 [IU] via SUBCUTANEOUS
  Administered 2022-05-26: 11 [IU] via SUBCUTANEOUS
  Administered 2022-05-26: 3 [IU] via SUBCUTANEOUS
  Administered 2022-05-27: 11 [IU] via SUBCUTANEOUS
  Administered 2022-05-27: 8 [IU] via SUBCUTANEOUS
  Administered 2022-05-27: 3 [IU] via SUBCUTANEOUS

## 2022-05-25 MED ORDER — FUROSEMIDE 10 MG/ML IJ SOLN
40.0000 mg | Freq: Two times a day (BID) | INTRAMUSCULAR | Status: DC
  Administered 2022-05-25 – 2022-05-26 (×3): 40 mg via INTRAVENOUS
  Filled 2022-05-25 (×3): qty 4

## 2022-05-25 NOTE — Progress Notes (Signed)
RN went to patient's room, pt c/o SOB. Pt sitting on side of bed with CPAP off. O2 sat 87%.RA. RN advised pt to put on CPAP or [place]. O2 sat 91% after wearing CPAP for 10 minutes. RN increased CPAP O2 to 4L. O2 sats 94% on CPAP at 4L. RT notified of change.

## 2022-05-25 NOTE — Progress Notes (Signed)
PROGRESS NOTE    MAZIAH KEELING  WNU:[id number] DOB: 13-Jul-1941 DOA: 05/21/2022 PCP: [name], MD   Brief Narrative:    Lucas Dudley is a 81 y.o. male with medical history significant for atrial fibrillation on chronic anticoagulation, systolic CHF, hypertension, diabetes. Patient went to see his cardiologist today, had blood work drawn, which showed hemoglobin of 7.6, was referred to the ED. he has been admitted with acute anemia and upper and lower endoscopy completed on 8/10 with no significant findings.  He is currently undergoing a capsule study.  Assessment & Plan:   Principal Problem:   Acute anemia Active Problems:   Pneumonia   Acute respiratory failure with hypoxia (HCC)   Essential hypertension   Diabetes mellitus with no complication (HCC)   OSA (obstructive sleep apnea)   Chronic systolic dysfunction of left ventricle   A-fib (HCC)   Obesity, Class III, BMI 40-49.9 (morbid obesity) (HCC)  Assessment and Plan:  Acute anemia currently stable -Acute symptomatic anemia, hemoglobin down to 7.8, 12-13 last checked about 6 months ago. -Patient is chronically on Eliquis and reported no frank overt bleeding appreciated.  -Fecal occult blood test was positive.  -s/p EGD and colonoscopy 8/10 with ongoing video capsule today -Continue to follow hemoglobin trend -Continue PPI -Repeat n.p.o. after midnight.   -Recheck CBC in a.m.   Pneumonia -Symptomatic with productive cough with dyspnea at time of admission (which may be secondary to pneumonia versus anemia).   -Sepsis rule out  -Normal WBCs currently  -No requiring oxygen supplementation  -Reports still intermittent coughing spells (nonproductive).   -Follow final culture results; continue current antibiotics and start flutter valve.   -Continue as needed mucolytic/antitussive medication. -Patient expressed dyspnea is better already after transfusion.   Acute respiratory failure with hypoxia (HCC) -O2 sats down  to 89% on room air at time of admission -Chest x-ray suggested pneumonia versus atelectasis -Decrease in patient's oxygen saturation could be related with anemia. -After transfusion good saturation appreciated on room air and patient expressing improvement in his shortness of breath symptoms. -Empirically receiving treatment for possible bronchitis/pneumonia. -Follow clinical response -Noted to have volume overload 8/11 on chest x-ray and started on IV Lasix twice daily   Obesity, Class III, BMI 40-49.9 (morbid obesity) ([place]) -Body mass index is 36.48 kg/m. -Low calorie diet, portion control and increase physical activity discussed with patient.   A-fib ([place]) -Rate controlled, on amiodarone and carvedilol  -Chronically on Eliquis for secondary prevention.   -Continue holding Eliquis at this moment until capsule study completed -Follow GI service clearance for resumption.   -Continue telemetry monitoring.     Chronic systolic dysfunction of left ventricle -Stable and overall compensated.   -BNP stable at 166.  -Last echo 07/2021, EF of 60 to 65% with indeterminate LV diastolic parameters. -Continue beta-blocker and current dose of Lasix. -Follow daily weights and strict I's and O's.   OSA (obstructive sleep apnea) -Continue the use of CPAP nightly   Diabetes mellitus with no complication ([place]) -Reports history of hypoglycemia, with recent insulin dose adjusted by PCP. -Holding oral hypoglycemic agents and Ozempic while inpatient -Continue sliding scale insulin -Follow CBGs and further adjust hypoglycemic management as needed. -A1c 8.0   Essential hypertension - Stable overall -Continue current antihypertensive regimen. -Heart healthy diet discussed with patient.     DVT prophylaxis: SCDs Code Status: Full Family Communication: Discussed with daughter on phone 8/11 Disposition Plan:  Status is: Inpatient Remains inpatient appropriate because: Need for inpatient  procedure     Consultants:  GI   Procedures:  EGD and colonoscopy 8/10   Antimicrobials:  Anti-infectives (From admission, onward)    Start     Dose/Rate Route Frequency Ordered Stop   05/21/22 2200  valACYclovir (VALTREX) tablet 1,000 mg        1,000 mg Oral Daily at bedtime 05/21/22 1859     05/21/22 2000  doxycycline (VIBRAMYCIN) 100 mg in sodium chloride 0.9 % 250 mL IVPB        100 mg 125 mL/hr over 120 Minutes Intravenous Every 12 hours 05/21/22 1845     05/21/22 1830  azithromycin (ZITHROMAX) 500 mg in sodium chloride 0.9 % 250 mL IVPB  Status:  Discontinued        500 mg 250 mL/hr over 60 Minutes Intravenous Every 24 hours 05/21/22 1731 05/21/22 1845   05/21/22 1800  cefTRIAXone (ROCEPHIN) 2 g in sodium chloride 0.9 % 100 mL IVPB        2 g 200 mL/hr over 30 Minutes Intravenous Every 24 hours 05/21/22 1731 05/26/22 1759       Subjective: Patient seen and evaluated today with worsening hypoxemia and mild dyspnea noted.  He also has some mild congestion and cough.  Lower extremity edema noted.  Objective: Vitals:   05/24/22 1656 05/24/22 2019 05/25/22 0405 05/25/22 0527  BP: (!) 143/64 (!) 143/60 (!) 146/65 (!) 151/68  Pulse: 66 67 80 73  Resp: '20 20 20   '$ Temp: 98 F (36.7 C) 98.7 F (37.1 C) 100.2 F (37.9 C) 100 F (37.8 C)  TempSrc: Oral  Oral Oral  SpO2: 93% 92% 92% 91%  Weight:      Height:        Intake/Output Summary (Last 24 hours) at 05/25/2022 1127 Last data filed at 05/25/2022 0900 Gross per 24 hour  Intake 1501.01 ml  Output 850 ml  Net 651.01 ml   Filed Weights   05/21/22 1418 05/24/22 1345  Weight: 112 kg 109.8 kg    Examination:  General exam: Appears calm and comfortable  Respiratory system:Respiratory effort normal.  Currently on nasal cannula oxygen with congestion bilaterally. Cardiovascular system: S1 & S2 heard, RRR.  Gastrointestinal system: Abdomen is soft Central nervous system: Alert and awake Extremities: 1-2+ pitting  edema bilaterally Skin: No significant lesions noted Psychiatry: Flat affect.    Data Reviewed: I have personally reviewed following labs and imaging studies  CBC: Recent Labs  Lab 05/21/22 1221 05/21/22 1449 05/22/22 0005 05/24/22 0503 05/25/22 0635  WBC 10.0 10.5 9.6 9.0 14.7*  HGB 7.6* 7.8* 8.6*  8.5* 8.5* 9.0*  HCT 26.0* 24.3* 29.1*  28.0* 28.9* 31.3*  MCV 85.0 83.5 84.6 85.0 85.3  PLT 305 331 299 327 818   Basic Metabolic Panel: Recent Labs  Lab 05/21/22 1221 05/21/22 1449 05/22/22 0005 05/24/22 0503 05/25/22 0635  NA 137 139 136 139 137  K 4.1 4.2 3.6 3.5 3.7  CL 99 99 98 103 102  CO2 '25 28 28 28 25  '$ GLUCOSE 138* 185* 188* 144* 178*  BUN 20 25* 28* 11 9  CREATININE 1.21 1.47* 1.34* 0.92 0.87  CALCIUM 8.3* 8.1* 7.7* 8.2* 8.6*  MG  --   --   --  1.4* 1.9   GFR: Estimated Creatinine Clearance: 85.4 mL/min (by C-G formula based on SCr of 0.87 mg/dL). Liver Function Tests: Recent Labs  Lab 05/21/22 1221  AST 17  ALT 14  ALKPHOS 64  BILITOT 0.7  PROT 6.9  ALBUMIN 3.4*   No results for input(s): "LIPASE", "AMYLASE" in the last 168 hours. No results for input(s): "AMMONIA" in the last 168 hours. Coagulation Profile: No results for input(s): "INR", "PROTIME" in the last 168 hours. Cardiac Enzymes: No results for input(s): "CKTOTAL", "CKMB", "CKMBINDEX", "TROPONINI" in the last 168 hours. BNP (last 3 results) No results for input(s): "PROBNP" in the last 8760 hours. HbA1C: No results for input(s): "HGBA1C" in the last 72 hours. CBG: Recent Labs  Lab 05/24/22 1652 05/24/22 2117 05/25/22 0034 05/25/22 0154 05/25/22 1119  GLUCAP 142* 215* 145* 154* 191*   Lipid Profile: No results for input(s): "CHOL", "HDL", "LDLCALC", "TRIG", "CHOLHDL", "LDLDIRECT" in the last 72 hours. Thyroid Function Tests: No results for input(s): "TSH", "T4TOTAL", "FREET4", "T3FREE", "THYROIDAB" in the last 72 hours. Anemia Panel: No results for input(s): "VITAMINB12",  "FOLATE", "FERRITIN", "TIBC", "IRON", "RETICCTPCT" in the last 72 hours. Sepsis Labs: No results for input(s): "PROCALCITON", "LATICACIDVEN" in the last 168 hours.  No results found for this or any previous visit (from the past 240 hour(s)).       Radiology Studies: DG CHEST PORT 1 VIEW  Result Date: 05/25/2022 CLINICAL DATA:  History. EXAM: PORTABLE CHEST 1 VIEW COMPARISON:  Prior chest radiographs 05/21/2022 and earlier. FINDINGS: Enlarged appearance of the cardiopericardial silhouette. Aortic atherosclerosis. Chronic elevation of the right hemidiaphragm. Streaky and ill-defined opacities within the right mid and lower lung field, progressed from the prior examination. Persistent small right pleural effusion. No evidence of pneumothorax. Known subacute, displaced fractures of the right lateral ninth and tenth ribs. Soft tissue anchors within the proximal right humerus. IMPRESSION: Enlarged appearance of the cardiopericardial silhouette, which may reflect cardiomegaly and/or the presence of a pericardial effusion. Streaky and ill-defined opacities within the right mid and lower lung field, progressed. Findings may reflect atelectasis or pneumonia. Small right pleural effusion. Chronic elevation of the right hemidiaphragm. Aortic Atherosclerosis (ICD10-I70.0). Electronically Signed   By: Kellie Simmering D.O.   On: 05/25/2022 08:33        Scheduled Meds:  amiodarone  200 mg Oral Daily   amLODipine  10 mg Oral QHS   brimonidine  1 drop Both Eyes BID   carvedilol  6.25 mg Oral BID WC   furosemide  40 mg Intravenous Q12H   gemfibrozil  600 mg Oral QHS   insulin aspart  0-15 Units Subcutaneous Q6H   lisinopril  40 mg Oral Daily   melatonin  6 mg Oral QHS   pantoprazole (PROTONIX) IV  40 mg Intravenous Q12H   timolol  1 drop Both Eyes BID   valACYclovir  1,000 mg Oral QHS   Continuous Infusions:  cefTRIAXone (ROCEPHIN)  IV 2 g (05/24/22 1810)   doxycycline (VIBRAMYCIN) IV Stopped (05/25/22  0011)     LOS: 3 days    Time spent: 35 minutes    Anairis Knick Darleen Crocker, DO [facility]  If 7PM-7AM, please contact night-coverage [email or web address] 05/25/2022, 11:27 AM

## 2022-05-25 NOTE — Interval H&P Note (Signed)
History and Physical Interval Note:  05/25/2022 7:37 AM  Lucas Dudley  has presented today for surgery, with the diagnosis of symptomatic anemia, obscure GI bleeding.  The various methods of treatment have been discussed with the patient and family. After consideration of risks, benefits and other options for treatment, the patient has consented to  Procedure(s): GIVENS CAPSULE STUDY (N/A) as a surgical intervention.  The patient's history has been reviewed, patient examined, no change in status, stable for surgery.  I have reviewed the patient's chart and labs.  Questions were answered to the patient's satisfaction.     Eloise Harman

## 2022-05-25 NOTE — Progress Notes (Addendum)
[facility] Note  06/08/2022    CHIEF COMPLAINT Patient presents for Retina Follow Up  HISTORY OF PRESENT ILLNESS: Lucas Dudley is a 81 y.o. male who presents to the clinic today for:  HPI     Retina Follow Up   Patient presents with  Diabetic Retinopathy.  In both eyes.  This started 5 weeks ago.  I, the attending physician,  performed the HPI with the patient and updated documentation appropriately.        Comments   Patient here for 5 weeks retina follow up for NPDR OU. Patient states vision about the same. No eye pain. Got new RX for glasses. Not filled yet. Was in hospital for 12 days. Has internal bleeding-  watching. Taken off Eliquis.      Last edited by Bernarda Caffey, MD on 06/08/2022  4:00 PM.    Patient states that he was in the hospital for 12 days. 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 A growth was found in the small intestines and it was biopsied. The results of the biopsy were negative.  He had a blood transfusion and lost 20 lbs.   Referring physician: [name], MD [place],  [place] [id number]  HISTORICAL INFORMATION:   Selected notes from the MEDICAL RECORD NUMBER Referred by Dr. Kathlen Mody for DEE   CURRENT MEDICATIONS: Current Outpatient Medications (Ophthalmic Drugs)  Medication Sig   COMBIGAN 0.2-0.5 % ophthalmic solution Apply 1 drop to eye 2 (two) times daily.   No current facility-administered medications for this visit. (Ophthalmic Drugs)   Current Outpatient Medications (Other)  Medication Sig   albuterol (VENTOLIN HFA) 108 (90 Base) MCG/ACT inhaler Inhale 2 puffs into the lungs every 4 (four) hours as needed for wheezing or shortness of breath.   amiodarone (PACERONE) 200 MG tablet TAKE 1 TABLET BY MOUTH EVERY DAY   carvedilol (COREG) 3.125 MG tablet Take 1 tablet (3.125 mg total) by mouth 2 (two) times daily.   CRANBERRY PO Take 1 tablet by mouth daily.    diphenhydramine-acetaminophen (TYLENOL PM) 25-500 MG TABS tablet Take 2 tablets by mouth at bedtime.   docusate sodium (COLACE) 100 MG capsule Take 1 capsule (100 mg total) by mouth daily.   ferrous sulfate 325 (65 FE) MG tablet Take 1 tablet (325 mg total) by mouth daily with breakfast.   gemfibrozil (LOPID) 600 MG tablet Take 600 mg by mouth at bedtime.    glimepiride (AMARYL) 2 MG tablet Take 2 mg by mouth at bedtime.   Insulin Glargine (BASAGLAR KWIKPEN) 100 UNIT/ML Inject 35 Units into the skin at bedtime. (Patient taking differently: Inject 30 Units into the skin at bedtime.)   KLOR-CON M20 20 MEQ tablet TAKE 1 TABLET BY MOUTH EVERY DAY   lisinopril (ZESTRIL) 20 MG tablet Take 1 tablet (20 mg total) by mouth daily.   Melatonin 10 MG TABS Take 10 mg by mouth at bedtime.   metFORMIN (GLUCOPHAGE-XR) 500 MG 24 hr tablet Take 1,000 mg by mouth in the morning and at bedtime.   mineral oil liquid Take 15 mLs by mouth daily as needed for moderate constipation.   Multiple Vitamins-Minerals (MULTIVITAMIN WITH MINERALS) tablet Take 1 tablet by mouth daily. Centrum men 50+   Omega-3 1000 MG CAPS Take 1,000 mg by mouth daily.   OZEMPIC, 1 MG/DOSE, 4 MG/3ML SOPN Inject 1 mg into the skin once a week.   pantoprazole (PROTONIX) 40 MG  tablet Take 1 tablet (40 mg total) by mouth 2 (two) times daily.   valACYclovir (VALTREX) 1000 MG tablet Take 1,000 mg by mouth at bedtime.   torsemide (DEMADEX) 20 MG tablet Take 1 tablet (20 mg total) by mouth 2 (two) times daily. (Patient not taking: Reported on 06/07/2022)   No current facility-administered medications for this visit. (Other)   REVIEW OF SYSTEMS: ROS   Positive for: Gastrointestinal, Endocrine, Cardiovascular, Eyes, Respiratory Negative for: Constitutional, Neurological, Skin, Genitourinary, Musculoskeletal, HENT, Psychiatric, Allergic/Imm, Heme/Lymph Last edited by Theodore Demark, COA on 06/08/2022  2:25 PM.     ALLERGIES Allergies  Allergen  Reactions   Azithromycin Nausea Only   Tape Rash and Other (See Comments)    Causes skin redness, Use paper tape only.   PAST MEDICAL HISTORY Past Medical History:  Diagnosis Date   Arthritis    Atrial fibrillation (HCC)    CAD (coronary artery disease)    a. Cath 03/17/15 showing 100% ostial D1, 50% prox LAD to mid LAD, 40% RPDA stenosis. Med rx. // Myoview 01/2020: EF 31 diffuse perfusion defect without reversibility (suspect artifact); reviewed with Dr. Aggie Cosier study felt to be low risk   Cataract    Mixed form OD   Chronic diastolic CHF (congestive heart failure) (HCC)    Diabetic retinopathy ([place])    NPDR OU   Essential hypertension    Glaucoma    POAG OU   History of gout    Hyperlipidemia    Hypertensive retinopathy    OU   Kidney stones    Melanoma of neck (HCC)    NICM (nonischemic cardiomyopathy) ([place])    OSA on CPAP 2012   Prostate cancer ([place])    Type II diabetes mellitus ([place])    Past Surgical History:  Procedure Laterality Date   ABDOMINAL HERNIA REPAIR     w/mesh   ATRIAL FIBRILLATION ABLATION N/A 06/06/2021   Procedure: ATRIAL FIBRILLATION ABLATION;  Surgeon: Thompson Grayer, MD;  Location: [facility];  Service: Cardiovascular;  Laterality: N/A;   BIOPSY  05/24/2022   Procedure: BIOPSY;  Surgeon: Daneil Dolin, MD;  [facility];  Service: Endoscopy;;   BIOPSY  05/30/2022   Procedure: BIOPSY;  Surgeon: Daneil Dolin, MD;  [facility];  Service: Endoscopy;;   CARDIAC CATHETERIZATION N/A 03/17/2015   Procedure: Left Heart Cath and Coronary Angiography;  Surgeon: Lorretta Harp, MD;  Location: [facility];  Service: Cardiovascular;  Laterality: N/A;   CARDIOVERSION N/A 08/09/2021   Procedure: CARDIOVERSION;  Surgeon: [name], MD;  Location: [facility];  Service: Cardiovascular;  Laterality: N/A;   CARDIOVERSION N/A 11/10/2021   Procedure: CARDIOVERSION;  Surgeon: Berniece Salines, DO;  Location: [facility];   Service: Cardiovascular;  Laterality: N/A;   carotid doppler  09/17/2008   rigt and left ICAs 0-49%;mildly  abnormal   CATARACT EXTRACTION Left 2020   Dr. Kathlen Mody   COLONOSCOPY N/A 05/16/2017   Procedure: COLONOSCOPY;  Surgeon: Rogene Houston, MD;  [facility];  Service: Endoscopy;  Laterality: N/A;  930   COLONOSCOPY WITH PROPOFOL N/A 05/24/2022   Procedure: COLONOSCOPY WITH PROPOFOL;  Surgeon: Daneil Dolin, MD;  [facility];  Service: Endoscopy;  Laterality: N/A;   DOPPLER ECHOCARDIOGRAPHY  05/25/2009   EF 50-55%,LA mildly dilated, LV function normal   ELECTROPHYSIOLOGIC STUDY N/A 04/05/2015   Procedure: Cardioversion;  Surgeon: Sanda Klein, MD;  Location: [facility];  Service:  Cardiovascular;  Laterality: N/A;   ELECTROPHYSIOLOGIC STUDY N/A 09/06/2015   Procedure: Atrial Fibrillation Ablation;  Surgeon: Thompson Grayer, MD;  Location: [facility];  Service: Cardiovascular;  Laterality: N/A;   ELECTROPHYSIOLOGIC STUDY N/A 07/12/2016   redo afib ablation by Dr Rayann Heman   ENTEROSCOPY N/A 05/30/2022   Procedure: ENTEROSCOPY;  Surgeon: Daneil Dolin, MD;  [facility];  Service: Endoscopy;  Laterality: N/A;   ESOPHAGOGASTRODUODENOSCOPY (EGD) WITH PROPOFOL N/A 05/24/2022   Procedure: ESOPHAGOGASTRODUODENOSCOPY (EGD) WITH PROPOFOL;  Surgeon: Daneil Dolin, MD;  [facility];  Service: Endoscopy;  Laterality: N/A;   EXCISIONAL HEMORRHOIDECTOMY     "inside and out"   EYE SURGERY Left 2020   Cat Sx - Dr. Kathlen Mody   FINE NEEDLE ASPIRATION Right    knee; "drew ~ 1 quart off"   GIVENS CAPSULE STUDY N/A 05/25/2022   Procedure: GIVENS CAPSULE STUDY;  Surgeon: Eloise Harman, DO;  [facility];  Service: Endoscopy;  Laterality: N/A;   HERNIA REPAIR     LAPAROSCOPIC CHOLECYSTECTOMY     MELANOMA EXCISION Right    "neck"   NM MYOCAR PERF WALL MOTION  02/21/2012   EF 61% ,EXERCISE 7 METS. exercise stopped due to wheezing and  shortness of breathe   POLYPECTOMY  05/16/2017   Procedure: POLYPECTOMY;  Surgeon: Rogene Houston, MD;  [facility];  Service: Endoscopy;;  colon   POLYPECTOMY  05/24/2022   Procedure: POLYPECTOMY;  Surgeon: Daneil Dolin, MD;  [facility];  Service: Endoscopy;;   PROSTATECTOMY     SHOULDER OPEN ROTATOR CUFF REPAIR Right X 2   SUBMUCOSAL TATTOO INJECTION  05/30/2022   Procedure: SUBMUCOSAL TATTOO INJECTION;  Surgeon: Daneil Dolin, MD;  [facility];  Service: Endoscopy;;   TEE WITHOUT CARDIOVERSION N/A 09/05/2015   Procedure: TRANSESOPHAGEAL ECHOCARDIOGRAM (TEE);  Surgeon: Sueanne Margarita, MD;  [facility];  Service: Cardiovascular;  Laterality: N/A;   FAMILY HISTORY Family History  Problem Relation Age of Onset   Heart disease Mother    Lung cancer Mother    Heart attack Mother 51   Diabetes Father    Heart disease Father    Stroke Brother    Healthy Daughter    Colon cancer Maternal Aunt        34s   SOCIAL HISTORY Social History   Tobacco Use   Smoking status: Former    Packs/day: 2.00    Years: 27.00    Total pack years: 54.00    Types: Cigarettes    Quit date: 10/31/1983    Years since quitting: 38.6   Smokeless tobacco: Never   Tobacco comments:    Former smoker 09/27/21  Vaping Use   Vaping Use: Never used  Substance Use Topics   Alcohol use: No    Alcohol/week: 0.0 standard drinks of alcohol    Comment: "used to drink; stopped ~ 2008"   Drug use: No       OPHTHALMIC EXAM: Base Eye Exam     Visual Acuity (Snellen - Linear)       Right Left   Dist cc 20/30 +2 20/50 -1   Dist ph cc NI 20/30 -2    Correction: Glasses         Tonometry (Tonopen, 2:22 PM)       Right Left   Pressure 10 11         Pupils  Dark Light Shape React APD   Right 2 1 Round Brisk None   Left 2 1 Round Brisk None         Visual Fields (Counting fingers)       Left Right    Full Full         Extraocular  Movement       Right Left    Full, Ortho Full, Ortho         Neuro/Psych     Oriented x3: Yes   Mood/Affect: Normal         Dilation     Both eyes: 1.0% Mydriacyl, 2.5% Phenylephrine @ 2:22 PM           Slit Lamp and Fundus Exam     Slit Lamp Exam       Right Left   Lids/Lashes Dermato, mild MGD Dermato, mild MGD   Conjunctiva/Sclera Temporal pinguecula, mild inferior sub conj heme Temporal pinguecula   Cornea EBMD, mild haze, trace PEE, mild Debris in tear film, well healed cataract wound trace haze, trace PEE, well healed temporal cataract wounds, arcus   Anterior Chamber Deep and clear; narrow temporal angle Deep and clear   Iris Round and dilated, mild anterior bowing, No NVI Round and moderately dilated to 5.61m   Lens PCIOL in good position, trace PCO PC IOL in good position with open PC   Anterior Vitreous Synerisis Synerisis         Fundus Exam       Right Left   Disc Mild pallor, sharp, +cupping, thin inferior rim Mild pallor, sharp rim, +cupping, +PPA   C/D Ratio 0.7 0.6   Macula Flat, blunted foveal reflex, trace central cystic changes - slightly increased, RPE mottling and clumping, no heme Flat, blunted foveal reflex, clusters of fine MA nasal and superior to fovea--improved, +cystic changes -- slightly increased   Vessels attenuated, Tortuous Attenuated, mild av crossing changes, mild tortuousity   Periphery Attached, rare MA Attached. No heme.           Refraction     Wearing Rx       Sphere Cylinder Axis Add   Right -0.25 +2.00 158 +2.50   Left -1.00 +1.25 008 +2.50           IMAGING AND PROCEDURES  Imaging and Procedures for 06/08/2022  OCT, Retina - OU - Both Eyes       Right Eye Quality was good. Central Foveal Thickness: 357. Progression has worsened. Findings include no SRF, abnormal foveal contour, intraretinal fluid, vitreomacular adhesion (Mild interval increase in central cyst; improved temporally ).   Left  Eye Quality was good. Central Foveal Thickness: 315. Progression has worsened. Findings include abnormal foveal contour, intraretinal hyper-reflective material, intraretinal fluid (Mild interval increase in IRF/cystic changes; partial PVD).   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 CME - cystoid macular edema. PED - pigment epithelial detachment. IRF - intraretinal fluid. SRF - subretinal fluid. EZ - ellipsoid zone. ERM - epiretinal membrane. ORA - outer retinal atrophy. ORT - outer retinal tubulation. SRHM - subretinal hyper-reflective material. IRHM - intraretinal hyper-reflective material     Intravitreal Injection, Pharmacologic Agent - OS - Left Eye  Time Out 06/08/2022. 3:45 PM. Confirmed correct patient, procedure, site, and patient consented.   Anesthesia Topical anesthesia was used. Anesthetic medications included Lidocaine 2%, Proparacaine 0.5%.   Procedure Preparation included 5% betadine to ocular surface, eyelid speculum. A (32g) needle was used.   Injection: 6 mg faricimab-svoa 6 MG/0.05ML   Route: Intravitreal, Site: Left Eye   NDC: 62952-841-32, Lot: [id number], Expiration date: 04/13/2024, Waste: 0 mL   Post-op Post injection exam found visual acuity of at least counting fingers. The patient tolerated the procedure well. There were no complications. The patient received written and verbal post procedure care education. Post injection medications were not given.            ASSESSMENT/PLAN:   ICD-10-CM   1. 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, associated with type 2 diabetes mellitus (HCC)  Z36.6440 OCT, Retina - OU - Both Eyes    Intravitreal Injection,  Pharmacologic Agent - OS - Left Eye    faricimab-svoa (VABYSMO) '6mg'$ /0.64m intravitreal injection    2. Essential hypertension  I10     3. Hypertensive retinopathy of both eyes  H35.033     4. Pseudophakia of both eyes  Z96.1     5. 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 History of herpes zoster of eye  Z86.19     7. Primary open angle glaucoma of both eyes, unspecified glaucoma stage  H40.1130      1. 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 Single focal MA superior to fovea; OS: Perifoveal Mas w/ late leakage - OCT shows diabetic macular edema OU (OS>OD) - s/p IVA OS # 1(07.26.21), #2 (08.24.21), #3 (09.21.21), #4 (10.22.21) -- IVA resistance - s/p IVE OS #1 (11.22.21), #2 (12.21.21), #3 (01.20.22), #4 (02.18.22), #5 (03.18.22), #6 (04.15.22), #7 (05.10.22) -- sample, #8 (06.08.22), #9 (07.12.22), #10 (08.12.22), #11 (09.16.22), #12 (10.21.22), #13 (11.18.22), #14 (12.21.22), #15 (01.20.23), #16 (02.17.23), #17 (03.24.23), #18 (04.21.23), #19 (05.26.23), #20 (06.23.23) -- IVE resistance - s/p IVV OS #1 (07.21.23) - BCVA 20/30 OD (stable); OS 20/0 (improved from 20/40)  - OCT shows OD: Mild interval increase in central cyst; improved temporally; OS: Mild interval increase in IRF/cystic changes; partial PVD  - recommend IVV OS #2 today, 08.25.23  - pt wishes to proceed with IVV OS #2 today  - RBA of procedure discussed, questions answered  - Vabysmo informed consent obtained and signed, 07.21.23 (OS) - see procedure note - IVA informed consent obtained and signed, 07.26.21 (OS) - IVE informed consent obtained and signed, 04.21.23 (OS) - Good Days approved until 10/14/22 - [employer] approved Vabysmo - f/u 4-5 weeks -- DFE/OCT, possible injection  2,3. Hypertensive retinopathy OU - discussed importance of tight BP control - Continue to monitor   4. Pseudophakia OU  - s/p CE/IOL OS (2020, Dr. WKathlen Mody             -  s/p CE/IOL OD (2022, Dr. WKathlen Mody  - s/p YAG cap OS (04.04.23) - BCVA 20/25 from 20/150  - IOLs in good position, doing well  - Continue to monitor   5. 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 GSusa Simmondsin August 2022  - s/p SuperK OS on September 25, 2021  6. History of herpes zoster/iritis OS  - on valtrex 1g daily---maintenance   7. POAG OU  - was under the expert management of Dr. WKathlen Mody  -  IOP 10,11 today  - Continue Cosopt BID OU  Ophthalmic Meds Ordered this visit:  Meds ordered this encounter  Medications   faricimab-svoa (VABYSMO) '6mg'$ /0.87m intravitreal injection     Return in about 4 weeks (around 07/06/2022) for f/u NPDR OU, DFE, OCT, Possible IVV OS.  There are no Patient Instructions on file for this visit.  This document serves as a record of services personally performed by BGardiner Sleeper MD, PhD. It was created on their behalf by CRenaldo Reel CRandlettan ophthalmic technician. The creation of this record is the provider's dictation and/or activities during the visit.    Electronically signed by:  CRenaldo Reel COT  05/25/22 4:00 PM  BGardiner Sleeper M.D., Ph.D. Diseases & Surgery of the Retina and Vitreous [facility] I have reviewed the above documentation for accuracy and completeness, and I agree with the above. BGardiner Sleeper M.D., Ph.D. 06/08/22 4:04 PM   Abbreviations: M myopia (nearsighted); A astigmatism; H hyperopia (farsighted); P presbyopia; Mrx spectacle prescription;  CTL contact lenses; OD right eye; OS left eye; OU both eyes  XT exotropia; ET esotropia; PEK punctate epithelial keratitis; PEE punctate epithelial erosions; DES dry eye syndrome; MGD meibomian gland dysfunction; ATs artificial tears; PFAT's preservative free artificial tears; N[place]woodnuclear sclerotic cataract; PSC posterior subcapsular cataract; ERM epi-retinal membrane; PVD posterior vitreous detachment; RD retinal detachment; DM diabetes mellitus;  DR diabetic retinopathy; NPDR non-proliferative diabetic retinopathy; PDR proliferative diabetic retinopathy; CSME clinically significant macular edema; DME diabetic macular edema; dbh dot blot hemorrhages; CWS cotton wool spot; POAG primary open angle glaucoma; C/D cup-to-disc ratio; HVF humphrey visual field; GVF goldmann visual field; OCT optical coherence tomography; IOP intraocular pressure; BRVO Branch retinal vein occlusion; CRVO central retinal vein occlusion; CRAO central retinal artery occlusion; BRAO branch retinal artery occlusion; RT retinal tear; SB scleral buckle; PPV pars plana vitrectomy; VH Vitreous hemorrhage; PRP panretinal laser photocoagulation; IVK intravitreal kenalog; VMT vitreomacular traction; MH Macular hole;  NVD neovascularization of the disc; NVE neovascularization elsewhere; AREDS age related eye disease study; ARMD age related macular degeneration; POAG primary open angle glaucoma; EBMD epithelial/anterior basement membrane dystrophy; ACIOL anterior chamber intraocular lens; IOL intraocular lens; PCIOL posterior chamber intraocular lens; Phaco/IOL phacoemulsification with intraocular lens placement; PMiddleburg Heightsphotorefractive keratectomy; LASIK laser assisted in situ keratomileusis; HTN hypertension; DM diabetes mellitus; COPD chronic obstructive pulmonary disease

## 2022-05-25 NOTE — Care Management Important Message (Signed)
Important Message  Patient Details  Name: Lucas Dudley MRN: [id number] Date of Birth: 11-Jul-1941   Medicare Important Message Given:  Yes     [name] 05/25/2022, 1:37 PM

## 2022-05-26 ENCOUNTER — Telehealth: Payer: Self-pay | Admitting: Internal Medicine

## 2022-05-26 DIAGNOSIS — D649 Anemia, unspecified: Secondary | ICD-10-CM | POA: Diagnosis not present

## 2022-05-26 LAB — CBC
HCT: 30.4 % — ABNORMAL LOW (ref 39.0–52.0)
Hemoglobin: 8.9 g/dL — ABNORMAL LOW (ref 13.0–17.0)
MCH: 24.7 pg — ABNORMAL LOW (ref 26.0–34.0)
MCHC: 29.3 g/dL — ABNORMAL LOW (ref 30.0–36.0)
MCV: 84.2 fL (ref 80.0–100.0)
Platelets: 337 10*3/uL (ref 150–400)
RBC: 3.61 MIL/uL — ABNORMAL LOW (ref 4.22–5.81)
RDW: 16.1 % — ABNORMAL HIGH (ref 11.5–15.5)
WBC: 13.4 10*3/uL — ABNORMAL HIGH (ref 4.0–10.5)
nRBC: 0.2 % (ref 0.0–0.2)

## 2022-05-26 LAB — BASIC METABOLIC PANEL
Anion gap: 11 (ref 5–15)
BUN: 12 mg/dL (ref 8–23)
CO2: 27 mmol/L (ref 22–32)
Calcium: 8.5 mg/dL — ABNORMAL LOW (ref 8.9–10.3)
Chloride: 100 mmol/L (ref 98–111)
Creatinine, Ser: 1.01 mg/dL (ref 0.61–1.24)
GFR, Estimated: >60 mL/min (ref >60)
Glucose, Bld: 206 mg/dL — ABNORMAL HIGH (ref 70–99)
Potassium: 3.5 mmol/L (ref 3.5–5.1)
Sodium: 138 mmol/L (ref 135–145)

## 2022-05-26 LAB — GLUCOSE, CAPILLARY
Glucose-Capillary: 207 mg/dL — ABNORMAL HIGH (ref 70–99)
Glucose-Capillary: 312 mg/dL — ABNORMAL HIGH (ref 70–99)
Glucose-Capillary: 277 mg/dL — ABNORMAL HIGH (ref 70–99)
Glucose-Capillary: 246 mg/dL — ABNORMAL HIGH (ref 70–99)

## 2022-05-26 LAB — MAGNESIUM: Magnesium: 1.7 mg/dL (ref 1.7–2.4)

## 2022-05-26 MED ORDER — APIXABAN 5 MG PO TABS
5.0000 mg | ORAL_TABLET | Freq: Two times a day (BID) | ORAL | Status: DC
  Administered 2022-05-26 – 2022-05-27 (×4): 5 mg via ORAL
  Filled 2022-05-26 (×5): qty 1

## 2022-05-26 MED ORDER — FERROUS SULFATE 325 (65 FE) MG PO TABS
325.0000 mg | ORAL_TABLET | Freq: Every day | ORAL | Status: DC
  Administered 2022-05-26 – 2022-06-01 (×7): 325 mg via ORAL
  Filled 2022-05-26 (×7): qty 1

## 2022-05-26 MED ORDER — FUROSEMIDE 40 MG PO TABS: 40.0000 mg | ORAL_TABLET | Freq: Two times a day (BID) | ORAL | Status: DC

## 2022-05-26 MED ORDER — DOCUSATE SODIUM 100 MG PO CAPS
100.0000 mg | ORAL_CAPSULE | Freq: Every day | ORAL | Status: DC
  Administered 2022-05-26 – 2022-06-01 (×7): 100 mg via ORAL
  Filled 2022-05-26 (×7): qty 1

## 2022-05-26 NOTE — Telephone Encounter (Signed)
Please arrange hospital follow-up visit with either Dr. Jenetta Downer or [facility] in 3 to 4 weeks.  Thank you

## 2022-05-26 NOTE — Procedures (Signed)
Small Bowel [place]ivens Capsule Study  Procedure date:  05/25/22  PCP:  Dr. 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  No active bleeding.  Capsule did not reach cecum.  Okay to resume anticoagulation.  Recommend p.o. iron supplementation upon discharge.  Repeat CBC in 1 week.  Follow-up with GI in 3 to 4 weeks.

## 2022-05-26 NOTE — Progress Notes (Signed)
PROGRESS NOTE    Lucas Dudley  GYB:[id number] DOB: 06-12-1941 DOA: 05/21/2022 PCP: [name], MD   Brief Narrative:    [name] is a 81 y.o. male with medical history significant for atrial fibrillation on chronic anticoagulation, systolic CHF, hypertension, diabetes. Patient went to see his cardiologist today, had blood work drawn, which showed hemoglobin of 7.6, was referred to the ED. he has been admitted with acute anemia and upper and lower endoscopy completed on 8/10 with no significant findings.  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  Assessment & Plan:   Principal Problem:   Symptomatic anemia Active Problems:   Pneumonia   Acute respiratory failure with hypoxia (HCC)   Essential hypertension   Diabetes mellitus with no complication (HCC)   OSA (obstructive sleep apnea)   Chronic systolic dysfunction of left ventricle   A-fib (HCC)   Obesity, Class III, BMI 40-49.9 (morbid obesity) (HCC)  Assessment and Plan:   Acute anemia currently stable -Acute symptomatic anemia, hemoglobin down to 7.8, 12-13 last checked about 6 months ago. -Patient is chronically on Eliquis and reported no frank overt bleeding appreciated.  -Fecal occult blood test was positive.  -s/p EGD and colonoscopy 8/10 with ongoing video capsule today -Continue to follow hemoglobin trend -Continue PPI -Follow CBC and resume Eliquis 8/12 -Start iron supplementation with stool softener   Pneumonia -Symptomatic with productive cough with dyspnea at time of admission (which may be secondary to pneumonia versus anemia).   -Sepsis rule out  -Normal WBCs currently  -No requiring oxygen supplementation  -Reports still intermittent coughing spells (nonproductive).   -Follow final culture results; continue current antibiotics and start flutter valve.   -Continue as needed mucolytic/antitussive medication. -Patient expressed dyspnea is  better already after transfusion.   Acute respiratory failure with hypoxia (HCC) -O2 sats down to 89% on room air at time of admission -Chest x-ray suggested pneumonia versus atelectasis -Decrease in patient's oxygen saturation could be related with anemia. -After transfusion good saturation appreciated on room air and patient expressing improvement in his shortness of breath symptoms. -Empirically receiving treatment for possible bronchitis/pneumonia. -Follow clinical response -Noted to have volume overload 8/11 on chest x-ray and started on IV Lasix twice daily   Obesity, Class III, BMI 40-49.9 (morbid obesity) ([place]) -Body mass index is 36.48 kg/m. -Low calorie diet, portion control and increase physical activity discussed with patient.   A-fib ([place]) -Rate controlled, on amiodarone and carvedilol  -Chronically on Eliquis for secondary prevention.   -Okay to resume Eliquis per GI 8/12 -Follow GI service clearance for resumption.   -Continue telemetry monitoring.     Chronic systolic dysfunction of left ventricle -Stable and overall compensated.   -BNP stable at 166.  -Last echo 07/2021, EF of 60 to 65% with indeterminate LV diastolic parameters. -Continue beta-blocker and current dose of Lasix. -Follow daily weights and strict I's and O's.   OSA (obstructive sleep apnea) -Continue the use of CPAP nightly   Diabetes mellitus with no complication ([name]) -Reports history of hypoglycemia, with recent insulin dose adjusted by PCP. -Holding oral hypoglycemic agents and Ozempic while inpatient -Continue sliding scale insulin -Follow CBGs and further adjust hypoglycemic management as needed. -A1c 8.0   Essential hypertension - Stable overall -Continue current antihypertensive regimen. -Heart healthy diet discussed with patient.     DVT prophylaxis: Eliquis Code Status: Full Family Communication: Discussed with daughter at bedside 8/12 Disposition Plan:  Status is:  Inpatient Remains inpatient appropriate because: Need for inpatient procedure     Consultants:  GI   Procedures:  EGD and colonoscopy 8/10 Capsule study 8/11  Antimicrobials:  Anti-infectives (From admission, onward)    Start     Dose/Rate Route Frequency Ordered Stop   05/21/22 2200  valACYclovir (VALTREX) tablet 1,000 mg        1,000 mg Oral Daily at bedtime 05/21/22 1859     05/21/22 2000  doxycycline (VIBRAMYCIN) 100 mg in sodium chloride 0.9 % 250 mL IVPB        100 mg 125 mL/hr over 120 Minutes Intravenous Every 12 hours 05/21/22 1845     05/21/22 1830  azithromycin (ZITHROMAX) 500 mg in sodium chloride 0.9 % 250 mL IVPB  Status:  Discontinued        500 mg 250 mL/hr over 60 Minutes Intravenous Every 24 hours 05/21/22 1731 05/21/22 1845   05/21/22 1800  cefTRIAXone (ROCEPHIN) 2 g in sodium chloride 0.9 % 100 mL IVPB        2 g 200 mL/hr over 30 Minutes Intravenous Every 24 hours 05/21/22 1731 05/25/22 1901      Subjective: Patient seen and evaluated today with no new acute complaints or concerns. No acute concerns or events noted overnight.  He continues to remain hypoxemic and has lower extremity edema with chest congestion.  Objective: Vitals:   05/25/22 1320 05/25/22 1956 05/26/22 0332 05/26/22 0704  BP: (!) 153/62 (!) 130/56 (!) 111/44   Pulse: 72 70 71   Resp: '18 16 18   '$ Temp: 99.3 F (37.4 C) (!) 101.1 F (38.4 C) 98.3 F (36.8 C)   TempSrc: Oral     SpO2: (!) 89% 92% 92%   Weight:    109 kg  Height:        Intake/Output Summary (Last 24 hours) at 05/26/2022 1243 Last data filed at 05/26/2022 0900 Gross per 24 hour  Intake 1200 ml  Output 450 ml  Net 750 ml   Filed Weights   05/21/22 1418 05/24/22 1345 05/26/22 0704  Weight: 112 kg 109.8 kg 109 kg    Examination:  General exam: Appears calm and comfortable  Respiratory system: Clear to auscultation. Respiratory effort normal.  3 L nasal cannula oxygen Cardiovascular system: S1 & S2 heard, RRR.   Gastrointestinal system: Abdomen is soft Central nervous system: Alert and awake Extremities: Scant bilateral edema Skin: No significant lesions noted Psychiatry: Flat affect.    Data Reviewed: I have personally reviewed following labs and imaging studies  CBC: Recent Labs  Lab 05/21/22 1449 05/22/22 0005 05/24/22 0503 05/25/22 0635 05/26/22 0556  WBC 10.5 9.6 9.0 14.7* 13.4*  HGB 7.8* 8.6*  8.5* 8.5* 9.0* 8.9*  HCT 24.3* 29.1*  28.0* 28.9* 31.3* 30.4*  MCV 83.5 84.6 85.0 85.3 84.2  PLT 331 299 327 365 161   Basic Metabolic Panel: Recent Labs  Lab 05/21/22 1449 05/22/22 0005 05/24/22 0503 05/25/22 0635 05/26/22 0556  NA 139 136 139 137 138  K 4.2 3.6 3.5 3.7 3.5  CL 99 98 103 102 100  CO2 '28 28 28 25 27  '$ GLUCOSE 185* 188* 144* 178* 206*  BUN 25* 28* '11 9 12  '$ CREATININE 1.47* 1.34* 0.92 0.87 1.01  CALCIUM 8.1* 7.7* 8.2* 8.6* 8.5*  MG  --   --  1.4* 1.9 1.7   GFR: Estimated Creatinine Clearance: 73.3 mL/min (by C-G formula based on SCr of 1.01 mg/dL). Liver Function Tests: Recent  Labs  Lab 05/21/22 1221  AST 17  ALT 14  ALKPHOS 64  BILITOT 0.7  PROT 6.9  ALBUMIN 3.4*   No results for input(s): "LIPASE", "AMYLASE" in the last 168 hours. No results for input(s): "AMMONIA" in the last 168 hours. Coagulation Profile: No results for input(s): "INR", "PROTIME" in the last 168 hours. Cardiac Enzymes: No results for input(s): "CKTOTAL", "CKMB", "CKMBINDEX", "TROPONINI" in the last 168 hours. BNP (last 3 results) No results for input(s): "PROBNP" in the last 8760 hours. HbA1C: No results for input(s): "HGBA1C" in the last 72 hours. CBG: Recent Labs  Lab 05/25/22 1119 05/25/22 1818 05/25/22 2333 05/26/22 0637 05/26/22 1105  GLUCAP 191* 265* 172* 207* 312*   Lipid Profile: No results for input(s): "CHOL", "HDL", "LDLCALC", "TRIG", "CHOLHDL", "LDLDIRECT" in the last 72 hours. Thyroid Function Tests: No results for input(s): "TSH", "T4TOTAL",  "FREET4", "T3FREE", "THYROIDAB" in the last 72 hours. Anemia Panel: No results for input(s): "VITAMINB12", "FOLATE", "FERRITIN", "TIBC", "IRON", "RETICCTPCT" in the last 72 hours. Sepsis Labs: No results for input(s): "PROCALCITON", "LATICACIDVEN" in the last 168 hours.  No results found for this or any previous visit (from the past 240 hour(s)).       Radiology Studies: DG CHEST PORT 1 VIEW  Result Date: 05/25/2022 CLINICAL DATA:  History. EXAM: PORTABLE CHEST 1 VIEW COMPARISON:  Prior chest radiographs 05/21/2022 and earlier. FINDINGS: Enlarged appearance of the cardiopericardial silhouette. Aortic atherosclerosis. Chronic elevation of the right hemidiaphragm. Streaky and ill-defined opacities within the right mid and lower lung field, progressed from the prior examination. Persistent small right pleural effusion. No evidence of pneumothorax. Known subacute, displaced fractures of the right lateral ninth and tenth ribs. Soft tissue anchors within the proximal right humerus. IMPRESSION: Enlarged appearance of the cardiopericardial silhouette, which may reflect cardiomegaly and/or the presence of a pericardial effusion. Streaky and ill-defined opacities within the right mid and lower lung field, progressed. Findings may reflect atelectasis or pneumonia. Small right pleural effusion. Chronic elevation of the right hemidiaphragm. Aortic Atherosclerosis (ICD10-I70.0). Electronically Signed   By: Kellie Simmering D.O.   On: 05/25/2022 08:33        Scheduled Meds:  amiodarone  200 mg Oral Daily   amLODipine  10 mg Oral QHS   apixaban  5 mg Oral BID   brimonidine  1 drop Both Eyes BID   carvedilol  6.25 mg Oral BID WC   gemfibrozil  600 mg Oral QHS   insulin aspart  0-15 Units Subcutaneous Q6H   lisinopril  40 mg Oral Daily   melatonin  6 mg Oral QHS   pantoprazole (PROTONIX) IV  40 mg Intravenous Q12H   timolol  1 drop Both Eyes BID   valACYclovir  1,000 mg Oral QHS   Continuous  Infusions:  doxycycline (VIBRAMYCIN) IV 100 mg (05/26/22 1140)     LOS: 4 days    Time spent: 35 minutes    Evangelynn Lochridge Darleen Crocker, DO [facility]  If 7PM-7AM, please contact night-coverage [email or web address] 05/26/2022, 12:43 PM

## 2022-05-27 DIAGNOSIS — D649 Anemia, unspecified: Secondary | ICD-10-CM | POA: Diagnosis not present

## 2022-05-27 LAB — BASIC METABOLIC PANEL
Anion gap: 10 (ref 5–15)
BUN: 15 mg/dL (ref 8–23)
CO2: 27 mmol/L (ref 22–32)
Calcium: 8.4 mg/dL — ABNORMAL LOW (ref 8.9–10.3)
Chloride: 99 mmol/L (ref 98–111)
Creatinine, Ser: 0.97 mg/dL (ref 0.61–1.24)
GFR, Estimated: >60 mL/min (ref >60)
Glucose, Bld: 192 mg/dL — ABNORMAL HIGH (ref 70–99)
Potassium: 3.4 mmol/L — ABNORMAL LOW (ref 3.5–5.1)
Sodium: 136 mmol/L (ref 135–145)

## 2022-05-27 LAB — CBC
HCT: 26.4 % — ABNORMAL LOW (ref 39.0–52.0)
Hemoglobin: 7.9 g/dL — ABNORMAL LOW (ref 13.0–17.0)
MCH: 24.7 pg — ABNORMAL LOW (ref 26.0–34.0)
MCHC: 29.9 g/dL — ABNORMAL LOW (ref 30.0–36.0)
MCV: 82.5 fL (ref 80.0–100.0)
Platelets: 339 10*3/uL (ref 150–400)
RBC: 3.2 MIL/uL — ABNORMAL LOW (ref 4.22–5.81)
RDW: 16.2 % — ABNORMAL HIGH (ref 11.5–15.5)
WBC: 13.2 10*3/uL — ABNORMAL HIGH (ref 4.0–10.5)
nRBC: 0.3 % — ABNORMAL HIGH (ref 0.0–0.2)

## 2022-05-27 LAB — GLUCOSE, CAPILLARY
Glucose-Capillary: 191 mg/dL — ABNORMAL HIGH (ref 70–99)
Glucose-Capillary: 186 mg/dL — ABNORMAL HIGH (ref 70–99)
Glucose-Capillary: 295 mg/dL — ABNORMAL HIGH (ref 70–99)
Glucose-Capillary: 261 mg/dL — ABNORMAL HIGH (ref 70–99)
Glucose-Capillary: 323 mg/dL — ABNORMAL HIGH (ref 70–99)
Glucose-Capillary: 249 mg/dL — ABNORMAL HIGH (ref 70–99)

## 2022-05-27 LAB — HEMOGLOBIN AND HEMATOCRIT, BLOOD
HCT: 25.3 % — ABNORMAL LOW (ref 39.0–52.0)
Hemoglobin: 7.6 g/dL — ABNORMAL LOW (ref 13.0–17.0)

## 2022-05-27 LAB — MAGNESIUM: Magnesium: 1.7 mg/dL (ref 1.7–2.4)

## 2022-05-27 MED ORDER — POTASSIUM CHLORIDE CRYS ER 20 MEQ PO TBCR
40.0000 meq | EXTENDED_RELEASE_TABLET | Freq: Once | ORAL | Status: AC
  Administered 2022-05-27: 40 meq via ORAL
  Filled 2022-05-27: qty 2

## 2022-05-27 MED ORDER — INSULIN ASPART 100 UNIT/ML IJ SOLN
0.0000 [IU] | Freq: Every day | INTRAMUSCULAR | Status: DC
  Administered 2022-05-27: 2 [IU] via SUBCUTANEOUS

## 2022-05-27 MED ORDER — POTASSIUM CHLORIDE 10 MEQ/100ML IV SOLN
10.0000 meq | INTRAVENOUS | Status: DC
  Filled 2022-05-27: qty 100

## 2022-05-27 MED ORDER — POTASSIUM CHLORIDE CRYS ER 20 MEQ PO TBCR
40.0000 meq | EXTENDED_RELEASE_TABLET | Freq: Three times a day (TID) | ORAL | Status: DC
  Administered 2022-05-27 – 2022-05-28 (×3): 40 meq via ORAL
  Filled 2022-05-27 (×3): qty 2

## 2022-05-27 MED ORDER — FUROSEMIDE 10 MG/ML IJ SOLN
80.0000 mg | Freq: Two times a day (BID) | INTRAMUSCULAR | Status: DC
  Administered 2022-05-27 – 2022-05-30 (×7): 80 mg via INTRAVENOUS
  Filled 2022-05-27 (×8): qty 8

## 2022-05-27 MED ORDER — INSULIN ASPART 100 UNIT/ML IJ SOLN
0.0000 [IU] | Freq: Three times a day (TID) | INTRAMUSCULAR | Status: DC
  Administered 2022-05-28: 11 [IU] via SUBCUTANEOUS
  Administered 2022-05-28: 3 [IU] via SUBCUTANEOUS
  Administered 2022-05-28: 8 [IU] via SUBCUTANEOUS
  Administered 2022-05-29: 11 [IU] via SUBCUTANEOUS
  Administered 2022-05-29: 5 [IU] via SUBCUTANEOUS
  Administered 2022-05-29: 3 [IU] via SUBCUTANEOUS
  Administered 2022-05-30: 5 [IU] via SUBCUTANEOUS
  Administered 2022-05-30: 8 [IU] via SUBCUTANEOUS
  Administered 2022-05-30: 5 [IU] via SUBCUTANEOUS
  Administered 2022-05-31: 2 [IU] via SUBCUTANEOUS
  Administered 2022-05-31: 3 [IU] via SUBCUTANEOUS
  Administered 2022-05-31 – 2022-06-01 (×2): 5 [IU] via SUBCUTANEOUS

## 2022-05-27 MED ORDER — FUROSEMIDE 10 MG/ML IJ SOLN
40.0000 mg | Freq: Two times a day (BID) | INTRAMUSCULAR | Status: DC
  Filled 2022-05-27: qty 4

## 2022-05-27 MED ORDER — FUROSEMIDE 10 MG/ML IJ SOLN
80.0000 mg | Freq: Once | INTRAMUSCULAR | Status: AC
  Administered 2022-05-27: 80 mg via INTRAVENOUS
  Filled 2022-05-27: qty 8

## 2022-05-27 NOTE — Progress Notes (Signed)
PROGRESS NOTE    Lucas Dudley  TFT:[id number] DOB: 18-Sep-1941 DOA: 05/21/2022 PCP: [name], MD   Brief Narrative:    Lucas Dudley is a 81 y.o. male with medical history significant for atrial fibrillation on chronic anticoagulation, systolic CHF, hypertension, diabetes. Patient went to see his cardiologist today, had blood work drawn, which showed hemoglobin of 7.6, was referred to the ED. he has been admitted with acute anemia and upper and lower endoscopy completed on 8/10 with no significant findings.  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  Assessment & Plan:   Principal Problem:   Symptomatic anemia Active Problems:   Pneumonia   Acute respiratory failure with hypoxia (HCC)   Essential hypertension   Diabetes mellitus with no complication (HCC)   OSA (obstructive sleep apnea)   Chronic systolic dysfunction of left ventricle   A-fib (HCC)   Obesity, Class III, BMI 40-49.9 (morbid obesity) (HCC)  Assessment and Plan:   Acute anemia  -Acute symptomatic anemia, hemoglobin down to 7.8, 12-13 last checked about 6 months ago. -Patient is chronically on Eliquis and reported no frank overt bleeding appreciated.  -Fecal occult blood test was positive.  -s/p EGD and colonoscopy 8/10 with ongoing video capsule today -Continue to follow hemoglobin trend -Continue PPI -Follow CBC and resume Eliquis 8/12 -Start iron supplementation with stool softener -Recheck H&H today as hemoglobin noted to be downtrending, but no overt bleeding   Pneumonia -Symptomatic with productive cough with dyspnea at time of admission (which may be secondary to pneumonia versus anemia).   -Sepsis rule out  -Normal WBCs currently  -No requiring oxygen supplementation  -Reports still intermittent coughing spells (nonproductive).   -Follow final culture results; continue current antibiotics and start flutter valve.   -Continue as  needed mucolytic/antitussive medication. -Patient expressed dyspnea is better already after transfusion. -Completed course of antibiotics   Acute respiratory failure with hypoxia (HCC) -O2 sats down to 89% on room air at time of admission -Chest x-ray suggested pneumonia versus atelectasis -Decrease in patient's oxygen saturation could be related with anemia. -After transfusion good saturation appreciated on room air and patient expressing improvement in his shortness of breath symptoms. -Empirically receiving treatment for possible bronchitis/pneumonia. -Follow clinical response -Noted to have volume overload 8/11 on chest x-ray and started on IV Lasix twice daily   Obesity, Class III, BMI 40-49.9 (morbid obesity) ([place]) -Body mass index is 36.48 kg/m. -Low calorie diet, portion control and increase physical activity discussed with patient.   A-fib ([place]) -Rate controlled, on amiodarone and carvedilol  -Chronically on Eliquis for secondary prevention.   -Okay to resume Eliquis per GI 8/12 -Follow GI service clearance for resumption.   -Continue telemetry monitoring.     Acute on chronic systolic dysfunction of left ventricle -BNP stable at 166.  -Last echo 07/2021, EF of 60 to 65% with indeterminate LV diastolic parameters. -Continue beta-blocker and IV Lasix twice daily 80 mg with noted increase -Follow daily weights and strict I's and O's.   OSA (obstructive sleep apnea) -Continue the use of CPAP nightly   Diabetes mellitus with no complication (HCC) -Reports history of hypoglycemia, with recent insulin dose adjusted by PCP. -Holding oral hypoglycemic agents and Ozempic while inpatient -Continue sliding scale insulin -Follow CBGs and further adjust hypoglycemic management as needed. -A1c 8.0   Essential hypertension - Stable overall -Continue aggressive IV diuresis and hold lisinopril 8/13 -Heart healthy diet discussed with  patient.  Hypokalemia -Replete p.o. and IV in  the setting of aggressive IV diuresis -Monitor in a.m.     DVT prophylaxis: Eliquis Code Status: Full Family Communication: Discussed with daughter on phone 8/13 Disposition Plan:  Status is: Inpatient Remains inpatient appropriate because: Need for inpatient procedure     Consultants:  GI   Procedures:  EGD and colonoscopy 8/10 Capsule study 8/11    Antimicrobials:  Anti-infectives (From admission, onward)    Start     Dose/Rate Route Frequency Ordered Stop   05/21/22 2200  valACYclovir (VALTREX) tablet 1,000 mg        1,000 mg Oral Daily at bedtime 05/21/22 1859     05/21/22 2000  doxycycline (VIBRAMYCIN) 100 mg in sodium chloride 0.9 % 250 mL IVPB  Status:  Discontinued        100 mg 125 mL/hr over 120 Minutes Intravenous Every 12 hours 05/21/22 1845 05/27/22 0747   05/21/22 1830  azithromycin (ZITHROMAX) 500 mg in sodium chloride 0.9 % 250 mL IVPB  Status:  Discontinued        500 mg 250 mL/hr over 60 Minutes Intravenous Every 24 hours 05/21/22 1731 05/21/22 1845   05/21/22 1800  cefTRIAXone (ROCEPHIN) 2 g in sodium chloride 0.9 % 100 mL IVPB        2 g 200 mL/hr over 30 Minutes Intravenous Every 24 hours 05/21/22 1731 05/25/22 1901      Subjective: Patient seen and evaluated today with ongoing chest congestion, cough, and edema.  He does not appear to have significant diuresis with current dose of IV Lasix.  No overt bleeding noted overnight.  Objective: Vitals:   05/26/22 1300 05/26/22 1500 05/26/22 2148 05/27/22 0518  BP: (!) 134/54  (!) 127/54 137/63  Pulse: 68  65 65  Resp: '18  18 20  '$ Temp: 97.7 F (36.5 C)  99 F (37.2 C) 99.6 F (37.6 C)  TempSrc: Oral  Oral Oral  SpO2: 93%  99% 97%  Weight:  109 kg  110.5 kg  Height:    '5\' 10"'$  (1.778 m)    Intake/Output Summary (Last 24 hours) at 05/27/2022 1040 Last data filed at 05/27/2022 0500 Gross per 24 hour  Intake 240 ml  Output 400 ml  Net -160 ml   Filed Weights   05/26/22 0704 05/26/22 1500  05/27/22 0518  Weight: 109 kg 109 kg 110.5 kg    Examination:  General exam: Appears calm and comfortable  Respiratory system: Clear to auscultation. Respiratory effort normal.  Nasal cannula oxygen Cardiovascular system: S1 & S2 heard, RRR.  Gastrointestinal system: Abdomen is soft Central nervous system: Alert and awake Extremities: 1+ edema Skin: No significant lesions noted Psychiatry: Flat affect.    Data Reviewed: I have personally reviewed following labs and imaging studies  CBC: Recent Labs  Lab 05/22/22 0005 05/24/22 0503 05/25/22 0635 05/26/22 0556 05/27/22 0535  WBC 9.6 9.0 14.7* 13.4* 13.2*  HGB 8.6*  8.5* 8.5* 9.0* 8.9* 7.9*  HCT 29.1*  28.0* 28.9* 31.3* 30.4* 26.4*  MCV 84.6 85.0 85.3 84.2 82.5  PLT 299 327 365 337 161   Basic Metabolic Panel: Recent Labs  Lab 05/22/22 0005 05/24/22 0503 05/25/22 0635 05/26/22 0556 05/27/22 0535  NA 136 139 137 138 136  K 3.6 3.5 3.7 3.5 3.4*  CL 98 103 102 100 99  CO2 '28 28 25 27 27  '$ GLUCOSE 188* 144* 178* 206* 192*  BUN 28* '11 9 12 15  '$ CREATININE 1.34* 0.92  0.87 1.01 0.97  CALCIUM 7.7* 8.2* 8.6* 8.5* 8.4*  MG  --  1.4* 1.9 1.7 1.7   GFR: Estimated Creatinine Clearance: 76.9 mL/min (by C-G formula based on SCr of 0.97 mg/dL). Liver Function Tests: Recent Labs  Lab 05/21/22 1221  AST 17  ALT 14  ALKPHOS 64  BILITOT 0.7  PROT 6.9  ALBUMIN 3.4*   No results for input(s): "LIPASE", "AMYLASE" in the last 168 hours. No results for input(s): "AMMONIA" in the last 168 hours. Coagulation Profile: No results for input(s): "INR", "PROTIME" in the last 168 hours. Cardiac Enzymes: No results for input(s): "CKTOTAL", "CKMB", "CKMBINDEX", "TROPONINI" in the last 168 hours. BNP (last 3 results) No results for input(s): "PROBNP" in the last 8760 hours. HbA1C: No results for input(s): "HGBA1C" in the last 72 hours. CBG: Recent Labs  Lab 05/26/22 1105 05/26/22 1813 05/26/22 2325 05/27/22 0520  05/27/22 0718  GLUCAP 312* 277* 246* 191* 186*   Lipid Profile: No results for input(s): "CHOL", "HDL", "LDLCALC", "TRIG", "CHOLHDL", "LDLDIRECT" in the last 72 hours. Thyroid Function Tests: No results for input(s): "TSH", "T4TOTAL", "FREET4", "T3FREE", "THYROIDAB" in the last 72 hours. Anemia Panel: No results for input(s): "VITAMINB12", "FOLATE", "FERRITIN", "TIBC", "IRON", "RETICCTPCT" in the last 72 hours. Sepsis Labs: No results for input(s): "PROCALCITON", "LATICACIDVEN" in the last 168 hours.  No results found for this or any previous visit (from the past 240 hour(s)).       Radiology Studies: No results found.      Scheduled Meds:  amiodarone  200 mg Oral Daily   amLODipine  10 mg Oral QHS   apixaban  5 mg Oral BID   brimonidine  1 drop Both Eyes BID   carvedilol  6.25 mg Oral BID WC   docusate sodium  100 mg Oral Daily   ferrous sulfate  325 mg Oral Q breakfast   furosemide  80 mg Intravenous Q12H   gemfibrozil  600 mg Oral QHS   insulin aspart  0-15 Units Subcutaneous Q6H   melatonin  6 mg Oral QHS   pantoprazole (PROTONIX) IV  40 mg Intravenous Q12H   timolol  1 drop Both Eyes BID   valACYclovir  1,000 mg Oral QHS   Continuous Infusions:  potassium chloride       LOS: 5 days    Time spent: 35 minutes    Rica Heather Darleen Crocker, DO [facility]  If 7PM-7AM, please contact night-coverage [email or web address] 05/27/2022, 10:40 AM

## 2022-05-28 ENCOUNTER — Inpatient Hospital Stay (HOSPITAL_COMMUNITY): Payer: Medicare Other

## 2022-05-28 DIAGNOSIS — D649 Anemia, unspecified: Secondary | ICD-10-CM | POA: Diagnosis not present

## 2022-05-28 LAB — BASIC METABOLIC PANEL
Anion gap: 6 (ref 5–15)
BUN: 19 mg/dL (ref 8–23)
CO2: 30 mmol/L (ref 22–32)
Calcium: 8.6 mg/dL — ABNORMAL LOW (ref 8.9–10.3)
Chloride: 100 mmol/L (ref 98–111)
Creatinine, Ser: 1.04 mg/dL (ref 0.61–1.24)
GFR, Estimated: >60 mL/min (ref >60)
Glucose, Bld: 217 mg/dL — ABNORMAL HIGH (ref 70–99)
Potassium: 4.4 mmol/L (ref 3.5–5.1)
Sodium: 136 mmol/L (ref 135–145)

## 2022-05-28 LAB — MAGNESIUM: Magnesium: 1.5 mg/dL — ABNORMAL LOW (ref 1.7–2.4)

## 2022-05-28 LAB — GLUCOSE, CAPILLARY
Glucose-Capillary: 198 mg/dL — ABNORMAL HIGH (ref 70–99)
Glucose-Capillary: 289 mg/dL — ABNORMAL HIGH (ref 70–99)
Glucose-Capillary: 309 mg/dL — ABNORMAL HIGH (ref 70–99)
Glucose-Capillary: 154 mg/dL — ABNORMAL HIGH (ref 70–99)

## 2022-05-28 LAB — CBC
HCT: 25.2 % — ABNORMAL LOW (ref 39.0–52.0)
Hemoglobin: 7.4 g/dL — ABNORMAL LOW (ref 13.0–17.0)
MCH: 24.7 pg — ABNORMAL LOW (ref 26.0–34.0)
MCHC: 29.4 g/dL — ABNORMAL LOW (ref 30.0–36.0)
MCV: 84.3 fL (ref 80.0–100.0)
Platelets: 336 10*3/uL (ref 150–400)
RBC: 2.99 MIL/uL — ABNORMAL LOW (ref 4.22–5.81)
RDW: 16.3 % — ABNORMAL HIGH (ref 11.5–15.5)
WBC: 11 10*3/uL — ABNORMAL HIGH (ref 4.0–10.5)
nRBC: 0.3 % — ABNORMAL HIGH (ref 0.0–0.2)

## 2022-05-28 LAB — HEMOGLOBIN AND HEMATOCRIT, BLOOD
HCT: 28.8 % — ABNORMAL LOW (ref 39.0–52.0)
Hemoglobin: 8.7 g/dL — ABNORMAL LOW (ref 13.0–17.0)

## 2022-05-28 LAB — SURGICAL PATHOLOGY

## 2022-05-28 LAB — PREPARE RBC (CROSSMATCH)

## 2022-05-28 MED ORDER — INSULIN ASPART 100 UNIT/ML IJ SOLN
4.0000 [IU] | Freq: Three times a day (TID) | INTRAMUSCULAR | Status: DC
  Administered 2022-05-28 – 2022-05-29 (×3): 4 [IU] via SUBCUTANEOUS

## 2022-05-28 MED ORDER — INSULIN GLARGINE-YFGN 100 UNIT/ML ~~LOC~~ SOLN
5.0000 [IU] | Freq: Every day | SUBCUTANEOUS | Status: DC
  Administered 2022-05-28: 5 [IU] via SUBCUTANEOUS
  Filled 2022-05-28 (×3): qty 0.05

## 2022-05-28 MED ORDER — IOHEXOL 9 MG/ML PO SOLN
ORAL | Status: AC
  Filled 2022-05-28: qty 1000

## 2022-05-28 MED ORDER — MAGNESIUM SULFATE 2 GM/50ML IV SOLN
2.0000 g | Freq: Once | INTRAVENOUS | Status: AC
Start: 2022-05-28 — End: 2022-05-28
  Administered 2022-05-28: 2 g via INTRAVENOUS
  Filled 2022-05-28: qty 50

## 2022-05-28 MED ORDER — IOHEXOL 300 MG/ML  SOLN
100.0000 mL | Freq: Once | INTRAMUSCULAR | Status: AC | PRN
  Administered 2022-05-28: 100 mL via INTRAVENOUS

## 2022-05-28 MED ORDER — SODIUM CHLORIDE 0.9% IV SOLUTION: Freq: Once | INTRAVENOUS | Status: AC

## 2022-05-28 MED ORDER — SALINE SPRAY 0.65 % NA SOLN
1.0000 | NASAL | Status: DC | PRN
  Administered 2022-05-28: 1 via NASAL
  Filled 2022-05-28: qty 44

## 2022-05-28 NOTE — Progress Notes (Signed)
PROGRESS NOTE    Lucas Dudley  YTK:[id number] DOB: [birth year] DOA: 05/21/2022 PCP: [name], MD   Brief Narrative:    Lucas Dudley is a 81 y.o. male with medical history significant for atrial fibrillation on chronic anticoagulation, systolic CHF, hypertension, diabetes. Patient went to see his cardiologist today, had blood work drawn, which showed hemoglobin of 7.6, was referred to the ED. he has been admitted with acute anemia and upper and lower endoscopy completed on 8/10 with no significant findings.  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  He continued to have declining hemoglobin levels and required another 1 unit PRBC transfusion and Eliquis was held.  He was noted to be volume overloaded and required aggressive IV diuresis.  Assessment & Plan:   Principal Problem:   Symptomatic anemia Active Problems:   Pneumonia   Acute respiratory failure with hypoxia (HCC)   Essential hypertension   Diabetes mellitus with no complication (HCC)   OSA (obstructive sleep apnea)   Chronic systolic dysfunction of left ventricle   A-fib (HCC)   Obesity, Class III, BMI 40-49.9 (morbid obesity) (HCC)  Assessment and Plan:   Acute anemia-worsening -Acute symptomatic anemia, hemoglobin down to 7.8, 12-13 last checked about 6 months ago. -Patient is chronically on Eliquis and reported no frank overt bleeding appreciated.  -Fecal occult blood test was positive.  -s/p EGD and colonoscopy 8/10 with ongoing video capsule today -Continue to follow hemoglobin trend -Continue PPI -Follow CBC and discontinue Eliquis for now after discussion with GI and cardiology -Start iron supplementation with stool softener -Transfuse 1 unit PRBC 8/14 and follow CBC in a.m., no overt bleeding noted   Pneumonia-resolved -Symptomatic with productive cough with dyspnea at time of admission (which may be secondary to pneumonia versus anemia).    -Sepsis rule out  -Normal WBCs currently  -No requiring oxygen supplementation  -Reports still intermittent coughing spells (nonproductive).   -Follow final culture results; continue current antibiotics and start flutter valve.   -Continue as needed mucolytic/antitussive medication. -Patient expressed dyspnea is better already after transfusion. -Completed course of antibiotics   Acute respiratory failure with hypoxia (HCC) -O2 sats down to 89% on room air at time of admission -Chest x-ray suggested pneumonia versus atelectasis -Decrease in patient's oxygen saturation could be related with anemia. -After transfusion good saturation appreciated on room air and patient expressing improvement in his shortness of breath symptoms. -Empirically receiving treatment for possible bronchitis/pneumonia. -Follow clinical response -Noted to have volume overload 8/11 on chest x-ray and started on IV Lasix twice daily   Obesity, Class III, BMI 40-49.9 (morbid obesity) ([place]) -Body mass index is 36.48 kg/m. -Low calorie diet, portion control and increase physical activity discussed with patient.   A-fib ([place]) -Rate controlled, on amiodarone and carvedilol  -Now holding Eliquis again due to worsening anemia, discussed with cardiology -Follow GI service clearance for resumption.   -Continue telemetry monitoring.     Acute on chronic systolic dysfunction of left ventricle -BNP stable at 166.  -Last echo 07/2021, EF of 60 to 65% with indeterminate LV diastolic parameters. -Continue beta-blocker and IV Lasix twice daily 80 mg with noted increase -Follow daily weights and strict I's and O's.   OSA (obstructive sleep apnea) -Continue the use of CPAP nightly   Diabetes mellitus with hyperglycemia -Reports history of hypoglycemia, with recent insulin dose adjusted by PCP. -Holding oral hypoglycemic agents and Ozempic while inpatient -Continue sliding  scale insulin -Follow CBGs and further adjust  hypoglycemic management as needed. -Added Semglee and mealtime insulin 8/14 per coordinator recommendations -A1c 8.0   Essential hypertension - Stable overall -Continue aggressive IV diuresis and hold lisinopril 8/13 -Heart healthy diet discussed with patient.   Hypokalemia -Repleted; monitor     DVT prophylaxis: Eliquis Code Status: Full Family Communication: Discussed with daughter on phone 8/14 Disposition Plan:  Status is: Inpatient Remains inpatient appropriate because: Need for inpatient procedure     Consultants:  GI   Procedures:  EGD and colonoscopy 8/10 Capsule study 8/11    Antimicrobials:  Anti-infectives (From admission, onward)    Start     Dose/Rate Route Frequency Ordered Stop   05/21/22 2200  valACYclovir (VALTREX) tablet 1,000 mg        1,000 mg Oral Daily at bedtime 05/21/22 1859     05/21/22 2000  doxycycline (VIBRAMYCIN) 100 mg in sodium chloride 0.9 % 250 mL IVPB  Status:  Discontinued        100 mg 125 mL/hr over 120 Minutes Intravenous Every 12 hours 05/21/22 1845 05/27/22 0747   05/21/22 1830  azithromycin (ZITHROMAX) 500 mg in sodium chloride 0.9 % 250 mL IVPB  Status:  Discontinued        500 mg 250 mL/hr over 60 Minutes Intravenous Every 24 hours 05/21/22 1731 05/21/22 1845   05/21/22 1800  cefTRIAXone (ROCEPHIN) 2 g in sodium chloride 0.9 % 100 mL IVPB        2 g 200 mL/hr over 30 Minutes Intravenous Every 24 hours 05/21/22 1731 05/25/22 1901       Subjective: Patient seen and evaluated today with no overt bleeding noted overnight, but hemoglobin downtrending.  He has been diuresing well with IV Lasix.  Notes less chest congestion, but has persistent lower extremity edema.  Objective: Vitals:   05/27/22 1040 05/27/22 1242 05/27/22 1900 05/28/22 0507  BP:  (!) 116/52 (!) 129/58 (!) 130/49  Pulse:  (!) 56 (!) 59 (!) 58  Resp:  '19 20 19  '$ Temp:  98.8 F (37.1 C) 99.2 F (37.3 C) 98.3 F (36.8 C)  TempSrc:  Oral Oral   SpO2: 90% 97%  98% 95%  Weight:    110.4 kg  Height:        Intake/Output Summary (Last 24 hours) at 05/28/2022 1036 Last data filed at 05/28/2022 0200 Gross per 24 hour  Intake 720 ml  Output 2430 ml  Net -1710 ml   Filed Weights   05/26/22 1500 05/27/22 0518 05/28/22 0507  Weight: 109 kg 110.5 kg 110.4 kg    Examination:  General exam: Appears calm and comfortable  Respiratory system: Clear to auscultation. Respiratory effort normal.  4 L nasal cannula Cardiovascular system: S1 & S2 heard, RRR.  Gastrointestinal system: Abdomen is soft Central nervous system: Alert and awake Extremities: Per distant 2+ edema bilaterally Skin: No significant lesions noted Psychiatry: Flat affect.    Data Reviewed: I have personally reviewed following labs and imaging studies  CBC: Recent Labs  Lab 05/24/22 0503 05/25/22 0635 05/26/22 0556 05/27/22 0535 05/27/22 1059 05/28/22 0534  WBC 9.0 14.7* 13.4* 13.2*  --  11.0*  HGB 8.5* 9.0* 8.9* 7.9* 7.6* 7.4*  HCT 28.9* 31.3* 30.4* 26.4* 25.3* 25.2*  MCV 85.0 85.3 84.2 82.5  --  84.3  PLT 327 365 337 339  --  035   Basic Metabolic Panel: Recent Labs  Lab 05/24/22 0503 05/25/22 4656 05/26/22 0556 05/27/22 0535 05/28/22 0534  NA 139 137 138 136 136  K 3.5 3.7 3.5 3.4* 4.4  CL 103 102 100 99 100  CO2 '28 25 27 27 30  '$ GLUCOSE 144* 178* 206* 192* 217*  BUN '11 9 12 15 19  '$ CREATININE 0.92 0.87 1.01 0.97 1.04  CALCIUM 8.2* 8.6* 8.5* 8.4* 8.6*  MG 1.4* 1.9 1.7 1.7 1.5*   GFR: Estimated Creatinine Clearance: 71.7 mL/min (by C-G formula based on SCr of 1.04 mg/dL). Liver Function Tests: Recent Labs  Lab 05/21/22 1221  AST 17  ALT 14  ALKPHOS 64  BILITOT 0.7  PROT 6.9  ALBUMIN 3.4*   No results for input(s): "LIPASE", "AMYLASE" in the last 168 hours. No results for input(s): "AMMONIA" in the last 168 hours. Coagulation Profile: No results for input(s): "INR", "PROTIME" in the last 168 hours. Cardiac Enzymes: No results for input(s):  "CKTOTAL", "CKMB", "CKMBINDEX", "TROPONINI" in the last 168 hours. BNP (last 3 results) No results for input(s): "PROBNP" in the last 8760 hours. HbA1C: No results for input(s): "HGBA1C" in the last 72 hours. CBG: Recent Labs  Lab 05/27/22 1115 05/27/22 1606 05/27/22 1806 05/27/22 2121 05/28/22 0744  GLUCAP 295* 261* 323* 249* 198*   Lipid Profile: No results for input(s): "CHOL", "HDL", "LDLCALC", "TRIG", "CHOLHDL", "LDLDIRECT" in the last 72 hours. Thyroid Function Tests: No results for input(s): "TSH", "T4TOTAL", "FREET4", "T3FREE", "THYROIDAB" in the last 72 hours. Anemia Panel: No results for input(s): "VITAMINB12", "FOLATE", "FERRITIN", "TIBC", "IRON", "RETICCTPCT" in the last 72 hours. Sepsis Labs: No results for input(s): "PROCALCITON", "LATICACIDVEN" in the last 168 hours.  No results found for this or any previous visit (from the past 240 hour(s)).       Radiology Studies: No results found.      Scheduled Meds:  sodium chloride   Intravenous Once   amiodarone  200 mg Oral Daily   amLODipine  10 mg Oral QHS   brimonidine  1 drop Both Eyes BID   carvedilol  6.25 mg Oral BID WC   docusate sodium  100 mg Oral Daily   ferrous sulfate  325 mg Oral Q breakfast   furosemide  80 mg Intravenous Q12H   gemfibrozil  600 mg Oral QHS   insulin aspart  0-15 Units Subcutaneous TID WC   insulin aspart  0-5 Units Subcutaneous QHS   insulin aspart  4 Units Subcutaneous TID WC   insulin glargine-yfgn  5 Units Subcutaneous Daily   melatonin  6 mg Oral QHS   pantoprazole (PROTONIX) IV  40 mg Intravenous Q12H   timolol  1 drop Both Eyes BID   valACYclovir  1,000 mg Oral QHS     LOS: 6 days    Time spent: 35 minutes    Raynald Rouillard Darleen Crocker, DO [facility]  If 7PM-7AM, please contact night-coverage [email or web address] 05/28/2022, 10:36 AM

## 2022-05-28 NOTE — Progress Notes (Signed)
Patients daughter at bedside requesting to speak with GI. MD Abbey Chatters made aware.

## 2022-05-28 NOTE — Progress Notes (Signed)
Patient completed blood transfusion with no adverse effects noted. Vital signs: T-98.6, P-57, R-18, BP-108/46, O2-96% at 4 liters. Md Manuella Ghazi made aware regarding vitals and blood transfusion being completed. New orders placed.

## 2022-05-28 NOTE — Inpatient Diabetes Management (Signed)
Inpatient Diabetes Program Recommendations  AACE/ADA: New Consensus Statement on Inpatient Glycemic Control   Target Ranges:  Prepandial:   less than 140 mg/dL      Peak postprandial:   less than 180 mg/dL (1-2 hours)      Critically ill patients:  140 - 180 mg/dL    Latest Reference Range & Units 05/27/22 07:18 05/27/22 11:15 05/27/22 16:06 05/27/22 18:06 05/27/22 21:21 05/28/22 07:44  Glucose-Capillary 70 - 99 mg/dL 186 (H) 295 (H) 261 (H) 323 (H) 249 (H) 198 (H)   Review of Glycemic Control  Diabetes history: DM2 Outpatient Diabetes medications: Amaryl 2 mg daily, Basaglar 30 units QHS, Metformin XR 1000 mg BID, Ozempic 1 mg Qweek Current orders for Inpatient glycemic control: Novolog 0-15 units TID with meals, Novolog 0-5 units QHS  Inpatient Diabetes Program Recommendations:    Insulin: Please consider ordering Semglee 5 units Q24H and Novolog 4 units TID with meals for meal coverage if patient eats at least 50% of meals.  Thanks, Barnie Alderman, RN, MSN, [place] Diabetes Coordinator Inpatient Diabetes Program [phone] (Team Pager from 8am to [place])

## 2022-05-28 NOTE — Progress Notes (Addendum)
Patients 15 minute post blood start vitals T-97.5, P-49, R-17, BP-109/50, O2-100% at 4 liters. Patient had ice chips while receiving blood. Patient reports no s/s of adverse effects. MD Manuella Ghazi made aware.

## 2022-05-29 ENCOUNTER — Encounter (HOSPITAL_COMMUNITY): Payer: Self-pay | Admitting: Internal Medicine

## 2022-05-29 ENCOUNTER — Encounter: Payer: Self-pay | Admitting: Internal Medicine

## 2022-05-29 DIAGNOSIS — D649 Anemia, unspecified: Secondary | ICD-10-CM | POA: Diagnosis not present

## 2022-05-29 LAB — TYPE AND SCREEN
ABO/RH(D): A POS
Antibody Screen: NEGATIVE
Unit division: 0

## 2022-05-29 LAB — BILIRUBIN, FRACTIONATED(TOT/DIR/INDIR)
Bilirubin, Direct: 0.1 mg/dL (ref 0.0–0.2)
Indirect Bilirubin: 0.5 mg/dL (ref 0.3–0.9)
Total Bilirubin: 0.6 mg/dL (ref 0.3–1.2)

## 2022-05-29 LAB — BASIC METABOLIC PANEL
Anion gap: 8 (ref 5–15)
BUN: 20 mg/dL (ref 8–23)
CO2: 29 mmol/L (ref 22–32)
Calcium: 8.5 mg/dL — ABNORMAL LOW (ref 8.9–10.3)
Chloride: 98 mmol/L (ref 98–111)
Creatinine, Ser: 1.02 mg/dL (ref 0.61–1.24)
GFR, Estimated: >60 mL/min (ref >60)
Glucose, Bld: 189 mg/dL — ABNORMAL HIGH (ref 70–99)
Potassium: 3.7 mmol/L (ref 3.5–5.1)
Sodium: 135 mmol/L (ref 135–145)

## 2022-05-29 LAB — CBC
HCT: 26.2 % — ABNORMAL LOW (ref 39.0–52.0)
Hemoglobin: 8 g/dL — ABNORMAL LOW (ref 13.0–17.0)
MCH: 25.5 pg — ABNORMAL LOW (ref 26.0–34.0)
MCHC: 30.5 g/dL (ref 30.0–36.0)
MCV: 83.4 fL (ref 80.0–100.0)
Platelets: 320 10*3/uL (ref 150–400)
RBC: 3.14 MIL/uL — ABNORMAL LOW (ref 4.22–5.81)
RDW: 16.2 % — ABNORMAL HIGH (ref 11.5–15.5)
WBC: 9.4 10*3/uL (ref 4.0–10.5)
nRBC: 0.2 % (ref 0.0–0.2)

## 2022-05-29 LAB — GLUCOSE, CAPILLARY
Glucose-Capillary: 210 mg/dL — ABNORMAL HIGH (ref 70–99)
Glucose-Capillary: 303 mg/dL — ABNORMAL HIGH (ref 70–99)
Glucose-Capillary: 163 mg/dL — ABNORMAL HIGH (ref 70–99)

## 2022-05-29 LAB — TECHNOLOGIST SMEAR REVIEW

## 2022-05-29 LAB — HEMOGLOBIN AND HEMATOCRIT, BLOOD
HCT: 28.4 % — ABNORMAL LOW (ref 39.0–52.0)
Hemoglobin: 8.7 g/dL — ABNORMAL LOW (ref 13.0–17.0)

## 2022-05-29 LAB — BPAM RBC
Blood Product Expiration Date: 202309052359
ISSUE DATE / TIME: 202308141105
Unit Type and Rh: 6200

## 2022-05-29 LAB — DIFFERENTIAL
Abs Immature Granulocytes: 0.05 10*3/uL (ref 0.00–0.07)
Basophils Absolute: 0 10*3/uL (ref 0.0–0.1)
Basophils Relative: 0 %
Eosinophils Absolute: 0.3 10*3/uL (ref 0.0–0.5)
Eosinophils Relative: 3 %
Immature Granulocytes: 1 %
Lymphocytes Relative: 11 %
Lymphs Abs: 1 10*3/uL (ref 0.7–4.0)
Monocytes Absolute: 0.8 10*3/uL (ref 0.1–1.0)
Monocytes Relative: 8 %
Neutro Abs: 7.5 10*3/uL (ref 1.7–7.7)
Neutrophils Relative %: 77 %

## 2022-05-29 LAB — MAGNESIUM: Magnesium: 1.5 mg/dL — ABNORMAL LOW (ref 1.7–2.4)

## 2022-05-29 LAB — LACTATE DEHYDROGENASE: LDH: 162 U/L (ref 98–192)

## 2022-05-29 MED ORDER — INSULIN ASPART 100 UNIT/ML IJ SOLN
7.0000 [IU] | Freq: Three times a day (TID) | INTRAMUSCULAR | Status: DC
  Administered 2022-05-29 – 2022-06-01 (×7): 7 [IU] via SUBCUTANEOUS

## 2022-05-29 MED ORDER — INSULIN GLARGINE-YFGN 100 UNIT/ML ~~LOC~~ SOLN
8.0000 [IU] | Freq: Every day | SUBCUTANEOUS | Status: DC
  Administered 2022-05-29 – 2022-05-31 (×3): 8 [IU] via SUBCUTANEOUS
  Filled 2022-05-29 (×4): qty 0.08

## 2022-05-29 MED ORDER — METOLAZONE 5 MG PO TABS
2.5000 mg | ORAL_TABLET | Freq: Once | ORAL | Status: AC
  Administered 2022-05-29: 2.5 mg via ORAL
  Filled 2022-05-29: qty 1

## 2022-05-29 MED ORDER — MAGNESIUM SULFATE 2 GM/50ML IV SOLN
2.0000 g | Freq: Once | INTRAVENOUS | Status: AC
  Administered 2022-05-29: 2 g via INTRAVENOUS
  Filled 2022-05-29: qty 50

## 2022-05-29 NOTE — Progress Notes (Signed)
Patient on CPAP for the night in Auto mode with 10lpm bleed in, pt tolerating well with no distress

## 2022-05-29 NOTE — Inpatient Diabetes Management (Signed)
Inpatient Diabetes Program Recommendations  AACE/ADA: New Consensus Statement on Inpatient Glycemic Control   Target Ranges:  Prepandial:   less than 140 mg/dL      Peak postprandial:   less than 180 mg/dL (1-2 hours)      Critically ill patients:  140 - 180 mg/dL    Latest Reference Range & Units 05/28/22 07:44 05/28/22 11:40 05/28/22 16:33 05/28/22 20:29 05/29/22 07:06  Glucose-Capillary 70 - 99 mg/dL 198 (H) 289 (H) 309 (H) 154 (H) 210 (H)   Review of Glycemic Control  Diabetes history: DM2 Outpatient Diabetes medications: Amaryl 2 mg daily, Basaglar 30 units QHS, Metformin XR 1000 mg BID, Ozempic 1 mg Qweek Current orders for Inpatient glycemic control: Semglee 5 units daily, Novolog 0-15 units TID with meals, Novolog 0-5 units QHS, Novolog 4 units TID with meals   Inpatient Diabetes Program Recommendations:    Insulin: Please consider increasing Semglee to 8 units daily and meal coverage to Novolog 7 units TID with meals if patient eats at least 50% of meals.  Thanks, Barnie Alderman, RN, MSN, [place] Diabetes Coordinator Inpatient Diabetes Program [phone] (Team Pager from 8am to [place])

## 2022-05-29 NOTE — Progress Notes (Signed)
Subjective: Denies brbpr or melena. Having soft, mushy Bms, 1-2 a day. No abdominal pain, nausea, vomiting. Eating well though doesn't have a great appetite. Still has SOB but feels fine as long as he is in O2, currently on 4L. Daughter states he has been on 4L since Thursday. States he was off his diuretics and became volume overloaded and hasn't been able to get back to his normal yet. Diuretics have been resumed.   Continues to be sleepy as he isn't able to sleep well. Sleeps maybe 2 or 3 hours.   Objective: Vital signs in last 24 hours: Temp:  [97.5 F (36.4 C)-99.1 F (37.3 C)] 98.4 F (36.9 C) (08/15 0453) Pulse Rate:  [49-63] 63 (08/15 0453) Resp:  [17-19] 18 (08/15 0453) BP: (108-139)/(46-64) 139/64 (08/15 0453) SpO2:  [93 %-100 %] 99 % (08/15 0453) Weight:  [110.8 kg] 110.8 kg (08/15 0425) Last BM Date : 05/26/22 General:   Alert and oriented, pleasant, NAD, in bedside recliner.  Head:  Normocephalic and atraumatic. Eyes:  No icterus, sclera clear. Conjuctiva pink.  Abdomen:  Bowel sounds present, soft, non-tender, non-distended. No HSM or hernias noted. No rebound or guarding. No masses appreciated  Msk:  Symmetrical without gross deformities. Normal posture. Extremities:  With 2-3+ pitting edema bilaterally in LE. Neurologic:  Alert and  oriented x4;  grossly normal neurologically. Skin:  Warm and dry, intact without significant lesions.  Psych: Normal mood and affect.  Intake/Output from previous day: 08/14 0701 - 08/15 0700 In: 586 [P.O.:240; Blood:346] Out: 1820 [Urine:1820] Intake/Output this shift: Total I/O In: 240 [P.O.:240] Out: -   Lab Results: Recent Labs    05/27/22 0535 05/27/22 1059 05/28/22 0534 05/28/22 1638 05/29/22 0423  WBC 13.2*  --  11.0*  --  9.4  HGB 7.9*   < > 7.4* 8.7* 8.0*  HCT 26.4*   < > 25.2* 28.8* 26.2*  PLT 339  --  336  --  320   < > = values in this interval not displayed.   BMET Recent Labs    05/27/22 0535  05/28/22 0534 05/29/22 0423  NA 136 136 135  K 3.4* 4.4 3.7  CL 99 100 98  CO2 '27 30 29  '$ GLUCOSE 192* 217* 189*  BUN '15 19 20  '$ CREATININE 0.97 1.04 1.02  CALCIUM 8.4* 8.6* 8.5*    Studies/Results: CT ABDOMEN PELVIS W CONTRAST  Result Date: 05/28/2022 CLINICAL DATA:  Anemia. Intermittent diarrhea and constipation. Abdominal pain. EXAM: CT ABDOMEN AND PELVIS WITH CONTRAST TECHNIQUE: Multidetector CT imaging of the abdomen and pelvis was performed using the standard protocol following bolus administration of intravenous contrast. RADIATION DOSE REDUCTION: This exam was performed according to the departmental dose-optimization program which includes automated exposure control, adjustment of the mA and/or kV according to patient size and/or use of iterative reconstruction technique. CONTRAST:  162m OMNIPAQUE IOHEXOL 300 MG/ML  SOLN COMPARISON:  Report from noncontrast abdominal CT from 06/18/2012, and cardiac CT from 05/31/2021 FINDINGS: Lower chest: Prominence of fatty tissues in the anterior mediastinum/pericardial space, potentially mediastinal lipomatosis or a lipoma in the pericardial adipose tissues. Mild cardiomegaly. Small right pleural effusion. Patchy ground-glass and airspace opacity in the right lower lobe suspicious for pneumonia. Mild mosaic attenuation at the lung bases and mild atelectasis along the right hemidiaphragm in the right middle lobe. Coronary artery and descending thoracic aortic atherosclerosis. Suspected subcarinal adenopathy, 2.1 cm in short axis on image 1 series 3. Hepatobiliary: Cholecystectomy.  Otherwise unremarkable. Pancreas: Unremarkable  Spleen: Unremarkable Adrenals/Urinary Tract: Both adrenal glands appear normal. 1.1 cm in diameter Bosniak category 1 exophytic cyst from the right kidney upper pole. 1.8 by 1.3 cm Bosniak category 1 cyst of the right kidney lower pole. 1.7 by 0.9 cm Bosniak category 1 cyst of the left mid kidney. None of these require further  imaging follow up. 0.8 cm left kidney lower pole nonobstructive renal calculus. No other urinary tract calculi are observed. No hydronephrosis or hydroureter. Stomach/Bowel: Unremarkable Vascular/Lymphatic: Atherosclerosis is present, including aortoiliac atherosclerotic disease. Reproductive: Prostatectomy. Mild fatty prominence along the right spermatic cord. Other: No supplemental non-categorized findings. Musculoskeletal: Ventral hernia mesh noted. Subacute healing fractures of the right lateral seventh, eighth, ninth, and tenth ribs. Additional older healed rib fractures are noted. 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 Loss of intervertebral disc height and Schmorl's nodes noted at all levels between L2 and L5. IMPRESSION: 1. Airspace opacity in the right lower lobe suspicious for pneumonia, with mild mosaic attenuation at the lung bases. Small right pleural effusion. 2. Early healing response of fractures of the right lateral seventh, eighth, ninth, and tenth ribs. 3. Stable subcarinal adenopathy at 2.1 cm in short axis, cause uncertain. 4. Other imaging findings of potential clinical significance: Mediastinal lipomatosis. Mild cardiomegaly. Aortic Atherosclerosis (ICD10-I70.0). Coronary atherosclerosis. Nonobstructive left nephrolithiasis. Prostatectomy. Ventral hernia mesh. Lumbar impingement at L3-4, L4-5, and L5-S1. Electronically Signed   By: Van Clines M.D.   On: 05/28/2022 18:26    Assessment: 81 y.o. year old male with medical history significant for atrial fibrillation on chronic anticoagulation, systolic CHF, hypertension, diabetes, who presented to the emergency room 8/7 after having follow-up with cardiology with CBC showing hemoglobin of 7.6, down from 13.1, 6 months ago, associated fatigue and SOB. In the  ED, he was found to be heme positive. Also found to have possible pneumonia on CXR. He was started on antibiotics and GI consulted for anemia.   Symptomatic anemia: Hemoglobin 7.8 on admission, down from 13.1, 6 months ago.  Stool heme positive.  Colonoscopy and EGD this admission.  EGD with small hiatal hernia, friable gastric mucosa, couple of tiny gastric erosions biopsied (minimal chronic gastritis, negative for H. pylori).  Colonoscopy with 5 mm hyperplastic polyp removed, diverticulosis in the descending colon and splenic flexure.  Capsule study completed 8/12 revealed multiple (3-4) AVMs in the small bowel, distal duodenum/jejunum with no active bleeding.  Capsule did not reach the cecum.  He received 1 unit PRBCs on 8/7.  Hemoglobin improved up to 9.0 on 8/11.  Eliquis was resumed 8/12.  Hemoglobin declined to 7.6 on 8/13.  Received a second unit of PRBCs on 8/14 with hemoglobin improved to 8.7, but back down to 8.0 this morning. Patient denies overt GI bleeding or any significant GI symptoms at this time.   Suspect he may have bleeding from small bowel AMVs in the setting of anticoagulation. Recommended push enteroscopy to see if we can reach AMVs that were noted on capsule endoscopy. Patient is agreeable to this.   Last dose of Eliquis was evening of 8/13.  Of note, I also spoke with Dr. Manuella Ghazi who is planning to start hemolysis workup as well.    Plan: Small bowel enteroscopy with propofol tomorrow with Dr. Gala Romney as long as respiratory status is stable.  Continue to hold Eliquis. Last dose was evening of 8/13.  NPO at midnight.  Check iron panel, B12, and folate Continue PPI BID Agree with starting hemolysis work up as well. Continue to monitor H/H and for overt GI bleeding. Transfuse as necessary.    LOS: 7 days    05/29/2022, 10:13 AM   Aliene Altes, [facility]

## 2022-05-29 NOTE — Progress Notes (Addendum)
PROGRESS NOTE    Lucas Dudley  ENI:[id number] DOB: [birth year] DOA: 05/21/2022 PCP: [name], MD   Brief Narrative:    Lucas Dudley is a 81 y.o. male with medical history significant for atrial fibrillation on chronic anticoagulation, systolic CHF, hypertension, diabetes. Patient went to see his cardiologist today, had blood work drawn, which showed hemoglobin of 7.6, was referred to the ED. he has been admitted with acute anemia and upper and lower endoscopy completed on 8/10 with no significant findings.  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  He continued to have declining hemoglobin levels and required another 1 unit PRBC transfusion and Eliquis was held.  He was noted to be volume overloaded and required aggressive IV diuresis.  Assessment & Plan:   Principal Problem:   Symptomatic anemia Active Problems:   Pneumonia   Acute respiratory failure with hypoxia (HCC)   Essential hypertension   Diabetes mellitus with no complication (HCC)   OSA (obstructive sleep apnea)   Chronic systolic dysfunction of left ventricle   A-fib (HCC)   Obesity, Class III, BMI 40-49.9 (morbid obesity) (HCC)  Assessment and Plan:   Acute anemia-worsening -Acute symptomatic anemia, hemoglobin downtrending yet again after PRBC transfusion 8/14 12-13 last checked about 6 months ago. -Patient is chronically on Eliquis and reported no frank overt bleeding appreciated.  -Fecal occult blood test was positive.  -s/p EGD and colonoscopy 8/10 with ongoing video capsule demonstrating some AVMs, but no acute bleeding -Continue to follow hemoglobin trend -Continue PPI -Follow CBC and discontinue Eliquis for now after discussion with GI and cardiology -Started iron supplementation with stool softener -No overt bleeding noted, but appreciate GI evaluation for possible enteroscopy given known AVMs -Hemolysis work-up ordered 8/15 with work-up  pending   Pneumonia-resolved -Symptomatic with productive cough with dyspnea at time of admission (which may be secondary to pneumonia versus anemia).   -Sepsis rule out  -Normal WBCs currently  -No requiring oxygen supplementation  -Reports still intermittent coughing spells (nonproductive).   -Follow final culture results; continue current antibiotics and start flutter valve.   -Continue as needed mucolytic/antitussive medication. -Patient expressed dyspnea is better already after transfusion. -Completed course of antibiotics -CT abdomen pelvis with findings of right-sided rib fractures as well as pleural effusion and pneumonia 8/15.  No other acute findings noted   Acute respiratory failure with hypoxia (HCC) -O2 sats down to 89% on room air at time of admission -Chest x-ray suggested pneumonia versus atelectasis -Decrease in patient's oxygen saturation could be related with anemia. -After transfusion good saturation appreciated on room air and patient expressing improvement in his shortness of breath symptoms. -Empirically receiving treatment for possible bronchitis/pneumonia. -Follow clinical response -Noted to have volume overload 8/11 on chest x-ray and started on IV Lasix twice daily   Obesity, Class III, BMI 40-49.9 (morbid obesity) ([place]) -Body mass index is 36.48 kg/m. -Low calorie diet, portion control and increase physical activity discussed with patient.   A-fib ([place]) -Rate controlled, on amiodarone and carvedilol  -Now holding Eliquis again due to worsening anemia, discussed with cardiology on 8/14 -Follow GI service clearance for resumption.   -Continue telemetry monitoring.     Acute on chronic systolic dysfunction of left ventricle -BNP stable at 166.  -Last echo 07/2021, EF of 60 to 65% with indeterminate LV diastolic parameters. -Continue beta-blocker and IV Lasix twice daily 80 mg with noted increase -Follow daily weights and strict  I's and O's.   OSA  (obstructive sleep apnea) -Continue the use of CPAP nightly   Diabetes mellitus with hyperglycemia -Reports history of hypoglycemia, with recent insulin dose adjusted by PCP. -Holding oral hypoglycemic agents and Ozempic while inpatient -Continue sliding scale insulin -Follow CBGs and further adjust hypoglycemic management as needed. -Added Semglee and mealtime insulin 8/14 per coordinator recommendations -A1c 8.0   Essential hypertension - Stable overall -Continue aggressive IV diuresis and hold lisinopril 8/13 -Heart healthy diet discussed with patient.   Hypomagnesemia -Repleted; monitor     DVT prophylaxis: SCDs Code Status: Full Family Communication: Discussed with daughter on phone 8/15 Disposition Plan:  Status is: Inpatient Remains inpatient appropriate because: Need for inpatient procedure     Consultants:  GI   Procedures:  EGD and colonoscopy 8/10 Capsule study 8/11   Antimicrobials:  Anti-infectives (From admission, onward)    Start     Dose/Rate Route Frequency Ordered Stop   05/21/22 2200  valACYclovir (VALTREX) tablet 1,000 mg        1,000 mg Oral Daily at bedtime 05/21/22 1859     05/21/22 2000  doxycycline (VIBRAMYCIN) 100 mg in sodium chloride 0.9 % 250 mL IVPB  Status:  Discontinued        100 mg 125 mL/hr over 120 Minutes Intravenous Every 12 hours 05/21/22 1845 05/27/22 0747   05/21/22 1830  azithromycin (ZITHROMAX) 500 mg in sodium chloride 0.9 % 250 mL IVPB  Status:  Discontinued        500 mg 250 mL/hr over 60 Minutes Intravenous Every 24 hours 05/21/22 1731 05/21/22 1845   05/21/22 1800  cefTRIAXone (ROCEPHIN) 2 g in sodium chloride 0.9 % 100 mL IVPB        2 g 200 mL/hr over 30 Minutes Intravenous Every 24 hours 05/21/22 1731 05/25/22 1901      Subjective: Patient seen and evaluated today with no new acute complaints or concerns. No acute concerns or events noted overnight.  He appears to be diuresing well and received PRBC transfusion  yesterday with no significant issues.  No overt bleeding noted or dark stools.  Objective: Vitals:   05/28/22 2032 05/28/22 2335 05/29/22 0425 05/29/22 0453  BP: 127/63   139/64  Pulse: (!) 51   63  Resp: 19   18  Temp: 99.1 F (37.3 C)   98.4 F (36.9 C)  TempSrc: Oral   Oral  SpO2: 99% 93%  99%  Weight:   110.8 kg   Height:        Intake/Output Summary (Last 24 hours) at 05/29/2022 1121 Last data filed at 05/29/2022 0900 Gross per 24 hour  Intake 826 ml  Output 1820 ml  Net -994 ml   Filed Weights   05/27/22 0518 05/28/22 0507 05/29/22 0425  Weight: 110.5 kg 110.4 kg 110.8 kg    Examination:  General exam: Appears calm and comfortable  Respiratory system: Clear to auscultation. Respiratory effort normal.  Nasal cannula oxygen Cardiovascular system: S1 & S2 heard, RRR.  Gastrointestinal system: Abdomen is soft Central nervous system: Alert and awake Extremities: Persistent, scant edema Skin: No significant lesions noted Psychiatry: Flat affect.    Data Reviewed: I have personally reviewed following labs and imaging studies  CBC: Recent Labs  Lab 05/25/22 0635 05/26/22 0556 05/27/22 0535 05/27/22 1059 05/28/22 0534 05/28/22 1638 05/29/22 0423  WBC 14.7* 13.4* 13.2*  --  11.0*  --  9.4  HGB 9.0* 8.9* 7.9* 7.6* 7.4* 8.7* 8.0*  HCT 31.3*  30.4* 26.4* 25.3* 25.2* 28.8* 26.2*  MCV 85.3 84.2 82.5  --  84.3  --  83.4  PLT 365 337 339  --  336  --  833   Basic Metabolic Panel: Recent Labs  Lab 05/25/22 0635 05/26/22 0556 05/27/22 0535 05/28/22 0534 05/29/22 0423  NA 137 138 136 136 135  K 3.7 3.5 3.4* 4.4 3.7  CL 102 100 99 100 98  CO2 '25 27 27 30 29  '$ GLUCOSE 178* 206* 192* 217* 189*  BUN '9 12 15 19 20  '$ CREATININE 0.87 1.01 0.97 1.04 1.02  CALCIUM 8.6* 8.5* 8.4* 8.6* 8.5*  MG 1.9 1.7 1.7 1.5* 1.5*   GFR: Estimated Creatinine Clearance: 73.2 mL/min (by C-G formula based on SCr of 1.02 mg/dL). Liver Function Tests: No results for input(s): "AST",  "ALT", "ALKPHOS", "BILITOT", "PROT", "ALBUMIN" in the last 168 hours. No results for input(s): "LIPASE", "AMYLASE" in the last 168 hours. No results for input(s): "AMMONIA" in the last 168 hours. Coagulation Profile: No results for input(s): "INR", "PROTIME" in the last 168 hours. Cardiac Enzymes: No results for input(s): "CKTOTAL", "CKMB", "CKMBINDEX", "TROPONINI" in the last 168 hours. BNP (last 3 results) No results for input(s): "PROBNP" in the last 8760 hours. HbA1C: No results for input(s): "HGBA1C" in the last 72 hours. CBG: Recent Labs  Lab 05/28/22 1140 05/28/22 1633 05/28/22 2029 05/29/22 0706 05/29/22 1100  GLUCAP 289* 309* 154* 210* 303*   Lipid Profile: No results for input(s): "CHOL", "HDL", "LDLCALC", "TRIG", "CHOLHDL", "LDLDIRECT" in the last 72 hours. Thyroid Function Tests: No results for input(s): "TSH", "T4TOTAL", "FREET4", "T3FREE", "THYROIDAB" in the last 72 hours. Anemia Panel: No results for input(s): "VITAMINB12", "FOLATE", "FERRITIN", "TIBC", "IRON", "RETICCTPCT" in the last 72 hours. Sepsis Labs: No results for input(s): "PROCALCITON", "LATICACIDVEN" in the last 168 hours.  No results found for this or any previous visit (from the past 240 hour(s)).       Radiology Studies: CT ABDOMEN PELVIS W CONTRAST  Result Date: 05/28/2022 CLINICAL DATA:  Anemia. Intermittent diarrhea and constipation. Abdominal pain. EXAM: CT ABDOMEN AND PELVIS WITH CONTRAST TECHNIQUE: Multidetector CT imaging of the abdomen and pelvis was performed using the standard protocol following bolus administration of intravenous contrast. RADIATION DOSE REDUCTION: This exam was performed according to the departmental dose-optimization program which includes automated exposure control, adjustment of the mA and/or kV according to patient size and/or use of iterative reconstruction technique. CONTRAST:  153m OMNIPAQUE IOHEXOL 300 MG/ML  SOLN COMPARISON:  Report from noncontrast abdominal  CT from 06/18/2012, and cardiac CT from 05/31/2021 FINDINGS: Lower chest: Prominence of fatty tissues in the anterior mediastinum/pericardial space, potentially mediastinal lipomatosis or a lipoma in the pericardial adipose tissues. Mild cardiomegaly. Small right pleural effusion. Patchy ground-glass and airspace opacity in the right lower lobe suspicious for pneumonia. Mild mosaic attenuation at the lung bases and mild atelectasis along the right hemidiaphragm in the right middle lobe. Coronary artery and descending thoracic aortic atherosclerosis. Suspected subcarinal adenopathy, 2.1 cm in short axis on image 1 series 3. Hepatobiliary: Cholecystectomy.  Otherwise unremarkable. Pancreas: Unremarkable Spleen: Unremarkable Adrenals/Urinary Tract: Both adrenal glands appear normal. 1.1 cm in diameter Bosniak category 1 exophytic cyst from the right kidney upper pole. 1.8 by 1.3 cm Bosniak category 1 cyst of the right kidney lower pole. 1.7 by 0.9 cm Bosniak category 1 cyst of the left mid kidney. None of these require further imaging follow up. 0.8 cm left kidney lower pole nonobstructive renal calculus. No other urinary tract  calculi are observed. No hydronephrosis or hydroureter. Stomach/Bowel: Unremarkable Vascular/Lymphatic: Atherosclerosis is present, including aortoiliac atherosclerotic disease. Reproductive: Prostatectomy. Mild fatty prominence along the right spermatic cord. Other: No supplemental non-categorized findings. Musculoskeletal: Ventral hernia mesh noted. Subacute healing fractures of the right lateral seventh, eighth, ninth, and tenth ribs. Additional older healed rib fractures are noted. 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 Loss of intervertebral disc height and Schmorl's nodes noted at all levels between L2 and L5.  IMPRESSION: 1. Airspace opacity in the right lower lobe suspicious for pneumonia, with mild mosaic attenuation at the lung bases. Small right pleural effusion. 2. Early healing response of fractures of the right lateral seventh, eighth, ninth, and tenth ribs. 3. Stable subcarinal adenopathy at 2.1 cm in short axis, cause uncertain. 4. Other imaging findings of potential clinical significance: Mediastinal lipomatosis. Mild cardiomegaly. Aortic Atherosclerosis (ICD10-I70.0). Coronary atherosclerosis. Nonobstructive left nephrolithiasis. Prostatectomy. Ventral hernia mesh. Lumbar impingement at L3-4, L4-5, and L5-S1. Electronically Signed   By: Van Clines M.D.   On: 05/28/2022 18:26        Scheduled Meds:  amiodarone  200 mg Oral Daily   brimonidine  1 drop Both Eyes BID   carvedilol  6.25 mg Oral BID WC   docusate sodium  100 mg Oral Daily   ferrous sulfate  325 mg Oral Q breakfast   furosemide  80 mg Intravenous Q12H   gemfibrozil  600 mg Oral QHS   insulin aspart  0-15 Units Subcutaneous TID WC   insulin aspart  0-5 Units Subcutaneous QHS   insulin aspart  7 Units Subcutaneous TID WC   insulin glargine-yfgn  8 Units Subcutaneous Daily   melatonin  6 mg Oral QHS   pantoprazole (PROTONIX) IV  40 mg Intravenous Q12H   timolol  1 drop Both Eyes BID   valACYclovir  1,000 mg Oral QHS   Continuous Infusions:  magnesium sulfate bolus IVPB       LOS: 7 days    Time spent: 35 minutes    Maili Shutters Darleen Crocker, DO [facility]  If 7PM-7AM, please contact night-coverage [email or web address] 05/29/2022, 11:21 AM

## 2022-05-29 NOTE — Plan of Care (Signed)
  Problem: Activity: Goal: Ability to tolerate increased activity will improve Outcome: Progressing   Problem: Clinical Measurements: Goal: Ability to maintain a body temperature in the normal range will improve Outcome: Progressing   Problem: Respiratory: Goal: Ability to maintain adequate ventilation will improve Outcome: Progressing Goal: Ability to maintain a clear airway will improve Outcome: Progressing   Problem: Coping: Goal: Ability to adjust to condition or change in health will improve Outcome: Progressing   Problem: Fluid Volume: Goal: Ability to maintain a balanced intake and output will improve Outcome: Progressing   Problem: Health Behavior/Discharge Planning: Goal: Ability to identify and utilize available resources and services will improve Outcome: Progressing Goal: Ability to manage health-related needs will improve Outcome: Progressing

## 2022-05-30 ENCOUNTER — Other Ambulatory Visit: Payer: Self-pay

## 2022-05-30 ENCOUNTER — Encounter (HOSPITAL_COMMUNITY)
Admission: EM | Disposition: A | Discharge status: Home / Self Care | Payer: Self-pay | Source: Home / Self Care | Attending: Internal Medicine

## 2022-05-30 ENCOUNTER — Inpatient Hospital Stay (HOSPITAL_COMMUNITY): Payer: Medicare Other | Admitting: Anesthesiology

## 2022-05-30 ENCOUNTER — Encounter (HOSPITAL_COMMUNITY): Payer: Self-pay | Admitting: Family Medicine

## 2022-05-30 DIAGNOSIS — K922 Gastrointestinal hemorrhage, unspecified: Secondary | ICD-10-CM | POA: Diagnosis not present

## 2022-05-30 DIAGNOSIS — D649 Anemia, unspecified: Secondary | ICD-10-CM | POA: Diagnosis not present

## 2022-05-30 DIAGNOSIS — I2510 Atherosclerotic heart disease of native coronary artery without angina pectoris: Secondary | ICD-10-CM

## 2022-05-30 DIAGNOSIS — K31819 Angiodysplasia of stomach and duodenum without bleeding: Secondary | ICD-10-CM

## 2022-05-30 DIAGNOSIS — I110 Hypertensive heart disease with heart failure: Secondary | ICD-10-CM | POA: Diagnosis not present

## 2022-05-30 DIAGNOSIS — I509 Heart failure, unspecified: Secondary | ICD-10-CM

## 2022-05-30 DIAGNOSIS — Z87891 Personal history of nicotine dependence: Secondary | ICD-10-CM

## 2022-05-30 HISTORY — PX: SUBMUCOSAL TATTOO INJECTION: SHX6856

## 2022-05-30 HISTORY — PX: ENTEROSCOPY: SHX5533

## 2022-05-30 HISTORY — PX: BIOPSY: SHX5522

## 2022-05-30 LAB — GLUCOSE, CAPILLARY
Glucose-Capillary: 205 mg/dL — ABNORMAL HIGH (ref 70–99)
Glucose-Capillary: 224 mg/dL — ABNORMAL HIGH (ref 70–99)
Glucose-Capillary: 265 mg/dL — ABNORMAL HIGH (ref 70–99)
Glucose-Capillary: 170 mg/dL — ABNORMAL HIGH (ref 70–99)

## 2022-05-30 LAB — CBC
HCT: 27.7 % — ABNORMAL LOW (ref 39.0–52.0)
Hemoglobin: 8.5 g/dL — ABNORMAL LOW (ref 13.0–17.0)
MCH: 25.5 pg — ABNORMAL LOW (ref 26.0–34.0)
MCHC: 30.7 g/dL (ref 30.0–36.0)
MCV: 83.2 fL (ref 80.0–100.0)
Platelets: 341 10*3/uL (ref 150–400)
RBC: 3.33 MIL/uL — ABNORMAL LOW (ref 4.22–5.81)
RDW: 16.4 % — ABNORMAL HIGH (ref 11.5–15.5)
WBC: 8.7 10*3/uL (ref 4.0–10.5)
nRBC: 0 % (ref 0.0–0.2)

## 2022-05-30 LAB — MAGNESIUM: Magnesium: 1.6 mg/dL — ABNORMAL LOW (ref 1.7–2.4)

## 2022-05-30 LAB — BASIC METABOLIC PANEL
Anion gap: 11 (ref 5–15)
BUN: 22 mg/dL (ref 8–23)
CO2: 33 mmol/L — ABNORMAL HIGH (ref 22–32)
Calcium: 8.9 mg/dL (ref 8.9–10.3)
Chloride: 92 mmol/L — ABNORMAL LOW (ref 98–111)
Creatinine, Ser: 1.13 mg/dL (ref 0.61–1.24)
GFR, Estimated: >60 mL/min (ref >60)
Glucose, Bld: 197 mg/dL — ABNORMAL HIGH (ref 70–99)
Potassium: 3.4 mmol/L — ABNORMAL LOW (ref 3.5–5.1)
Sodium: 136 mmol/L (ref 135–145)

## 2022-05-30 LAB — IRON AND TIBC
Iron: 18 ug/dL — ABNORMAL LOW (ref 45–182)
Saturation Ratios: 5 % — ABNORMAL LOW (ref 17.9–39.5)
TIBC: 344 ug/dL (ref 250–450)
UIBC: 326 ug/dL

## 2022-05-30 LAB — VITAMIN B12: Vitamin B-12: 624 pg/mL (ref 180–914)

## 2022-05-30 LAB — FOLATE: Folate: 13.5 ng/mL (ref >5.9)

## 2022-05-30 LAB — FERRITIN: Ferritin: 42 ng/mL (ref 24–336)

## 2022-05-30 LAB — HAPTOGLOBIN: Haptoglobin: 354 mg/dL (ref 34–355)

## 2022-05-30 SURGERY — ENTEROSCOPY
Anesthesia: General

## 2022-05-30 MED ORDER — SPOT INK MARKER SYRINGE KIT
PACK | SUBMUCOSAL | Status: DC | PRN
  Administered 2022-05-30: 1 mL via SUBMUCOSAL

## 2022-05-30 MED ORDER — LIDOCAINE VISCOUS HCL 2 % MT SOLN
OROMUCOSAL | Status: AC
  Administered 2022-05-30: 15 mL via OROMUCOSAL
  Filled 2022-05-30: qty 15

## 2022-05-30 MED ORDER — PROPOFOL 10 MG/ML IV BOLUS
INTRAVENOUS | Status: DC | PRN
  Administered 2022-05-30: 20 mg via INTRAVENOUS
  Administered 2022-05-30: 30 mg via INTRAVENOUS
  Administered 2022-05-30 (×2): 20 mg via INTRAVENOUS
  Administered 2022-05-30: 50 mg via INTRAVENOUS
  Administered 2022-05-30: 20 mg via INTRAVENOUS
  Administered 2022-05-30: 50 mg via INTRAVENOUS
  Administered 2022-05-30: 20 mg via INTRAVENOUS
  Administered 2022-05-30: 30 mg via INTRAVENOUS

## 2022-05-30 MED ORDER — LIDOCAINE VISCOUS HCL 2 % MT SOLN: 15.0000 mL | Freq: Once | OROMUCOSAL | Status: AC

## 2022-05-30 MED ORDER — POTASSIUM CHLORIDE CRYS ER 20 MEQ PO TBCR
40.0000 meq | EXTENDED_RELEASE_TABLET | Freq: Once | ORAL | Status: AC
Start: 2022-05-30 — End: 2022-05-30
  Administered 2022-05-30: 40 meq via ORAL
  Filled 2022-05-30: qty 2

## 2022-05-30 MED ORDER — LIDOCAINE HCL 1 % IJ SOLN
INTRAMUSCULAR | Status: DC | PRN
  Administered 2022-05-30: 50 mg via INTRADERMAL

## 2022-05-30 MED ORDER — SODIUM CHLORIDE 0.9 % IV SOLN: INTRAVENOUS | Status: DC

## 2022-05-30 MED ORDER — LACTATED RINGERS IV SOLN: INTRAVENOUS | Status: DC

## 2022-05-30 MED ORDER — MAGNESIUM SULFATE 2 GM/50ML IV SOLN
2.0000 g | Freq: Once | INTRAVENOUS | Status: AC
  Administered 2022-05-30: 2 g via INTRAVENOUS
  Filled 2022-05-30: qty 50

## 2022-05-30 MED ORDER — STERILE WATER FOR IRRIGATION IR SOLN
Status: DC | PRN
  Administered 2022-05-30: .6 mL

## 2022-05-30 NOTE — Transfer of Care (Signed)
Immediate Anesthesia Transfer of Care Note  Patient: Lucas Dudley  Procedure(s) Performed: ENTEROSCOPY BIOPSY SUBMUCOSAL TATTOO INJECTION  Patient Location: PACU  Anesthesia Type:General  Level of Consciousness: awake  Airway & Oxygen Therapy: Patient Spontanous Breathing  Post-op Assessment: Report given to RN  Post vital signs: Reviewed and stable  Last Vitals:  Vitals Value Taken Time  BP 114/58 05/30/22 1402  Temp 36.7 C 05/30/22 1402  Pulse 57 05/30/22 1402  Resp 12 05/30/22 1402  SpO2 93 % 05/30/22 1402    Last Pain:  Vitals:   05/30/22 1255  TempSrc: Oral  PainSc: 0-No pain      Patients Stated Pain Goal: 7 (08/11/89 2284)  Complications: No notable events documented.

## 2022-05-30 NOTE — Progress Notes (Signed)
PROGRESS NOTE    Lucas Dudley  GEZ:[id number] DOB: [birth year] DOA: 05/21/2022 PCP: [name], MD   Brief Narrative:    Lucas Dudley is a 81 y.o. male with medical history significant for atrial fibrillation on chronic anticoagulation, systolic CHF, hypertension, diabetes. Patient went to see his cardiologist today, had blood work drawn, which showed hemoglobin of 7.6, was referred to the ED. he has been admitted with acute anemia and upper and lower endoscopy completed on 8/10 with no significant findings.  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  He continued to have declining hemoglobin levels and required another 1 unit PRBC transfusion and Eliquis was held.  He was noted to be volume overloaded and required aggressive IV diuresis.  Assessment & Plan:   Principal Problem:   Symptomatic anemia Active Problems:   Pneumonia   Acute respiratory failure with hypoxia (HCC)   Essential hypertension   Diabetes mellitus with no complication (HCC)   OSA (obstructive sleep apnea)   Chronic systolic dysfunction of left ventricle   A-fib (HCC)   Obesity, Class III, BMI 40-49.9 (morbid obesity) ([place])  Assessment and Plan:   Acute anemia -Acute symptomatic anemia, hemoglobin downtrending yet again after PRBC transfusion 8/14 12-13 last checked about 6 months ago. -Patient is chronically on Eliquis and reported no frank overt bleeding appreciated.  -Fecal occult blood test was positive.  -s/p EGD and colonoscopy 8/10 with ongoing video capsule demonstrating some AVMs, but no acute bleeding -Continue to follow hemoglobin trend -Continue PPI -Follow CBC and discontinue Eliquis for now after discussion with GI and cardiology -Started iron supplementation with stool softener -No overt bleeding noted, but appreciate GI evaluation for enteroscopy today -Hemolysis work-up ordered 8/15 with work-up pending    Pneumonia-resolved -Symptomatic with productive cough with dyspnea at time of admission (which may be secondary to pneumonia versus anemia).   -Sepsis rule out  -Normal WBCs currently  -No requiring oxygen supplementation  -Reports still intermittent coughing spells (nonproductive).   -Follow final culture results; continue current antibiotics and start flutter valve.   -Continue as needed mucolytic/antitussive medication. -Patient expressed dyspnea is better already after transfusion. -Completed course of antibiotics -CT abdomen pelvis with findings of right-sided rib fractures as well as pleural effusion and pneumonia 8/15.  No other acute findings noted   Acute respiratory failure with hypoxia (HCC) -O2 sats down to 89% on room air at time of admission -Chest x-ray suggested pneumonia versus atelectasis -Decrease in patient's oxygen saturation could be related with anemia. -After transfusion good saturation appreciated on room air and patient expressing improvement in his shortness of breath symptoms. -Empirically receiving treatment for possible bronchitis/pneumonia. -Follow clinical response -Noted to have volume overload 8/11 on chest x-ray and started on IV Lasix twice daily   Obesity, Class III, BMI 40-49.9 (morbid obesity) ([place]) -Body mass index is 36.48 kg/m. -Low calorie diet, portion control and increase physical activity discussed with patient.   A-fib ([place]) -Rate controlled, on amiodarone and carvedilol  -Now holding Eliquis again due to worsening anemia, discussed with cardiology on 8/14 -Follow GI service clearance for resumption.   -Continue telemetry monitoring.     Acute on chronic systolic dysfunction of left ventricle -BNP stable at 166.  -Last echo 07/2021, EF of 60 to 65% with indeterminate LV diastolic parameters. -Continue beta-blocker and IV Lasix twice daily 80 mg with noted increase -Follow daily weights and strict I's and O's.  OSA (obstructive  sleep apnea) -Continue the use of CPAP nightly   Diabetes mellitus with hyperglycemia -Reports history of hypoglycemia, with recent insulin dose adjusted by PCP. -Holding oral hypoglycemic agents and Ozempic while inpatient -Continue sliding scale insulin -Follow CBGs and further adjust hypoglycemic management as needed. -Added Semglee and mealtime insulin 8/14 per coordinator recommendations -A1c 8.0   Essential hypertension - Stable overall -Continue aggressive IV diuresis and hold lisinopril 8/13 -Heart healthy diet discussed with patient.   Hypomagnesemia -Repleted; monitor     DVT prophylaxis: SCDs Code Status: Full Family Communication: Discussed with daughter on phone 8/16 Disposition Plan:  Status is: Inpatient Remains inpatient appropriate because: Need for inpatient procedure     Consultants:  GI   Procedures:  EGD and colonoscopy 8/10 Capsule study 8/11   Antimicrobials:  Anti-infectives (From admission, onward)    Start     Dose/Rate Route Frequency Ordered Stop   05/21/22 2200  valACYclovir (VALTREX) tablet 1,000 mg        1,000 mg Oral Daily at bedtime 05/21/22 1859     05/21/22 2000  doxycycline (VIBRAMYCIN) 100 mg in sodium chloride 0.9 % 250 mL IVPB  Status:  Discontinued        100 mg 125 mL/hr over 120 Minutes Intravenous Every 12 hours 05/21/22 1845 05/27/22 0747   05/21/22 1830  azithromycin (ZITHROMAX) 500 mg in sodium chloride 0.9 % 250 mL IVPB  Status:  Discontinued        500 mg 250 mL/hr over 60 Minutes Intravenous Every 24 hours 05/21/22 1731 05/21/22 1845   05/21/22 1800  cefTRIAXone (ROCEPHIN) 2 g in sodium chloride 0.9 % 100 mL IVPB        2 g 200 mL/hr over 30 Minutes Intravenous Every 24 hours 05/21/22 1731 05/25/22 1901      Subjective: Patient seen and evaluated today with no new acute complaints or concerns. No acute concerns or events noted overnight.  He is awaiting enteroscopy today.  No overt bleeding  noted.  Objective: Vitals:   05/29/22 0453 05/29/22 1353 05/29/22 2131 05/30/22 0527  BP: 139/64 (!) 134/56 134/66 (!) 147/54  Pulse: 63 (!) 58 (!) 57 66  Resp: '18 18 19 20  '$ Temp: 98.4 F (36.9 C) 98 F (36.7 C) 97.9 F (36.6 C) 97.7 F (36.5 C)  TempSrc: Oral Oral    SpO2: 99% 100% 91% 96%  Weight:    107.7 kg  Height:        Intake/Output Summary (Last 24 hours) at 05/30/2022 0910 Last data filed at 05/30/2022 0900 Gross per 24 hour  Intake 524.41 ml  Output 3375 ml  Net -2850.59 ml   Filed Weights   05/28/22 0507 05/29/22 0425 05/30/22 0527  Weight: 110.4 kg 110.8 kg 107.7 kg    Examination:  General exam: Appears calm and comfortable  Respiratory system: Clear to auscultation. Respiratory effort normal.  4 L nasal cannula Cardiovascular system: S1 & S2 heard, RRR.  Gastrointestinal system: Abdomen is soft Central nervous system: Alert and awake Extremities: Bilateral lower extremity edema Skin: No significant lesions noted Psychiatry: Flat affect.    Data Reviewed: I have personally reviewed following labs and imaging studies  CBC: Recent Labs  Lab 05/26/22 0556 05/27/22 0535 05/27/22 1059 05/28/22 0534 05/28/22 1638 05/29/22 0423 05/29/22 1221 05/30/22 0428  WBC 13.4* 13.2*  --  11.0*  --  9.4  --  8.7  NEUTROABS  --   --   --   --   --   --  7.5  --   HGB 8.9* 7.9*   < > 7.4* 8.7* 8.0* 8.7* 8.5*  HCT 30.4* 26.4*   < > 25.2* 28.8* 26.2* 28.4* 27.7*  MCV 84.2 82.5  --  84.3  --  83.4  --  83.2  PLT 337 339  --  336  --  320  --  341   < > = values in this interval not displayed.   Basic Metabolic Panel: Recent Labs  Lab 05/26/22 0556 05/27/22 0535 05/28/22 0534 05/29/22 0423 05/30/22 0428  NA 138 136 136 135 136  K 3.5 3.4* 4.4 3.7 3.4*  CL 100 99 100 98 92*  CO2 '27 27 30 29 '$ 33*  GLUCOSE 206* 192* 217* 189* 197*  BUN '12 15 19 20 22  '$ CREATININE 1.01 0.97 1.04 1.02 1.13  CALCIUM 8.5* 8.4* 8.6* 8.5* 8.9  MG 1.7 1.7 1.5* 1.5* 1.6*    GFR: Estimated Creatinine Clearance: 65.2 mL/min (by C-G formula based on SCr of 1.13 mg/dL). Liver Function Tests: Recent Labs  Lab 05/29/22 1221  BILITOT 0.6   No results for input(s): "LIPASE", "AMYLASE" in the last 168 hours. No results for input(s): "AMMONIA" in the last 168 hours. Coagulation Profile: No results for input(s): "INR", "PROTIME" in the last 168 hours. Cardiac Enzymes: No results for input(s): "CKTOTAL", "CKMB", "CKMBINDEX", "TROPONINI" in the last 168 hours. BNP (last 3 results) No results for input(s): "PROBNP" in the last 8760 hours. HbA1C: No results for input(s): "HGBA1C" in the last 72 hours. CBG: Recent Labs  Lab 05/28/22 2029 05/29/22 0706 05/29/22 1100 05/29/22 1631 05/30/22 0718  GLUCAP 154* 210* 303* 163* 205*   Lipid Profile: No results for input(s): "CHOL", "HDL", "LDLCALC", "TRIG", "CHOLHDL", "LDLDIRECT" in the last 72 hours. Thyroid Function Tests: No results for input(s): "TSH", "T4TOTAL", "FREET4", "T3FREE", "THYROIDAB" in the last 72 hours. Anemia Panel: Recent Labs    05/30/22 0428  VITAMINB12 624  FOLATE 13.5  FERRITIN 42  TIBC 344  IRON 18*   Sepsis Labs: No results for input(s): "PROCALCITON", "LATICACIDVEN" in the last 168 hours.  No results found for this or any previous visit (from the past 240 hour(s)).       Radiology Studies: CT ABDOMEN PELVIS W CONTRAST  Result Date: 05/28/2022 CLINICAL DATA:  Anemia. Intermittent diarrhea and constipation. Abdominal pain. EXAM: CT ABDOMEN AND PELVIS WITH CONTRAST TECHNIQUE: Multidetector CT imaging of the abdomen and pelvis was performed using the standard protocol following bolus administration of intravenous contrast. RADIATION DOSE REDUCTION: This exam was performed according to the departmental dose-optimization program which includes automated exposure control, adjustment of the mA and/or kV according to patient size and/or use of iterative reconstruction technique.  CONTRAST:  122m OMNIPAQUE IOHEXOL 300 MG/ML  SOLN COMPARISON:  Report from noncontrast abdominal CT from 06/18/2012, and cardiac CT from 05/31/2021 FINDINGS: Lower chest: Prominence of fatty tissues in the anterior mediastinum/pericardial space, potentially mediastinal lipomatosis or a lipoma in the pericardial adipose tissues. Mild cardiomegaly. Small right pleural effusion. Patchy ground-glass and airspace opacity in the right lower lobe suspicious for pneumonia. Mild mosaic attenuation at the lung bases and mild atelectasis along the right hemidiaphragm in the right middle lobe. Coronary artery and descending thoracic aortic atherosclerosis. Suspected subcarinal adenopathy, 2.1 cm in short axis on image 1 series 3. Hepatobiliary: Cholecystectomy.  Otherwise unremarkable. Pancreas: Unremarkable Spleen: Unremarkable Adrenals/Urinary Tract: Both adrenal glands appear normal. 1.1 cm in diameter Bosniak category 1 exophytic cyst from the right kidney upper pole. 1.8 by  1.3 cm Bosniak category 1 cyst of the right kidney lower pole. 1.7 by 0.9 cm Bosniak category 1 cyst of the left mid kidney. None of these require further imaging follow up. 0.8 cm left kidney lower pole nonobstructive renal calculus. No other urinary tract calculi are observed. No hydronephrosis or hydroureter. Stomach/Bowel: Unremarkable Vascular/Lymphatic: Atherosclerosis is present, including aortoiliac atherosclerotic disease. Reproductive: Prostatectomy. Mild fatty prominence along the right spermatic cord. Other: No supplemental non-categorized findings. Musculoskeletal: Ventral hernia mesh noted. Subacute healing fractures of the right lateral seventh, eighth, ninth, and tenth ribs. Additional older healed rib fractures are noted. Grade 1 degenerative retrolisthesis at L3-4 with congenitally short pedicles and prominent lumbar epidural adipose tissues as well as intervertebral and facet spurring resulting in impingement at L3-4, L4-5, and  L5-S1. Loss of intervertebral disc height and Schmorl's nodes noted at all levels between L2 and L5. IMPRESSION: 1. Airspace opacity in the right lower lobe suspicious for pneumonia, with mild mosaic attenuation at the lung bases. Small right pleural effusion. 2. Early healing response of fractures of the right lateral seventh, eighth, ninth, and tenth ribs. 3. Stable subcarinal adenopathy at 2.1 cm in short axis, cause uncertain. 4. Other imaging findings of potential clinical significance: Mediastinal lipomatosis. Mild cardiomegaly. Aortic Atherosclerosis (ICD10-I70.0). Coronary atherosclerosis. Nonobstructive left nephrolithiasis. Prostatectomy. Ventral hernia mesh. Lumbar impingement at L3-4, L4-5, and L5-S1. Electronically Signed   By: Van Clines M.D.   On: 05/28/2022 18:26        Scheduled Meds:  amiodarone  200 mg Oral Daily   brimonidine  1 drop Both Eyes BID   carvedilol  6.25 mg Oral BID WC   docusate sodium  100 mg Oral Daily   ferrous sulfate  325 mg Oral Q breakfast   furosemide  80 mg Intravenous Q12H   gemfibrozil  600 mg Oral QHS   insulin aspart  0-15 Units Subcutaneous TID WC   insulin aspart  0-5 Units Subcutaneous QHS   insulin aspart  7 Units Subcutaneous TID WC   insulin glargine-yfgn  8 Units Subcutaneous Daily   melatonin  6 mg Oral QHS   pantoprazole (PROTONIX) IV  40 mg Intravenous Q12H   timolol  1 drop Both Eyes BID   valACYclovir  1,000 mg Oral QHS   Continuous Infusions:  magnesium sulfate bolus IVPB 2 g (05/30/22 0824)     LOS: 8 days    Time spent: 35 minutes    Eloni Darius Darleen Crocker, DO [facility]  If 7PM-7AM, please contact night-coverage [email or web address] 05/30/2022, 9:10 AM

## 2022-05-30 NOTE — Care Management Important Message (Signed)
Important Message  Patient Details  Name: Lucas Dudley MRN: [id number] Date of Birth: November 10, 1940   Medicare Important Message Given:  Yes     [name] 05/30/2022, 12:10 PM

## 2022-05-30 NOTE — Inpatient Diabetes Management (Signed)
Inpatient Diabetes Program Recommendations  AACE/ADA: New Consensus Statement on Inpatient Glycemic Control   Target Ranges:  Prepandial:   less than 140 mg/dL      Peak postprandial:   less than 180 mg/dL (1-2 hours)      Critically ill patients:  140 - 180 mg/dL    Latest Reference Range & Units 05/29/22 07:06 05/29/22 11:00 05/29/22 16:31 05/30/22 07:18  Glucose-Capillary 70 - 99 mg/dL 210 (H) 303 (H) 163 (H) 205 (H)   Review of Glycemic Control  Diabetes history: DM2 Outpatient Diabetes medications: Amaryl 2 mg daily, Basaglar 30 units QHS, Metformin XR 1000 mg BID, Ozempic 1 mg Qweek Current orders for Inpatient glycemic control: Semglee 8 units daily, Novolog 0-15 units TID with meals, Novolog 0-5 units QHS, Novolog 7 units TID with meals   Inpatient Diabetes Program Recommendations:     Insulin: Please consider increasing Semglee to 12 units daily.  Thanks, Barnie Alderman, RN, MSN, [place] Diabetes Coordinator Inpatient Diabetes Program [phone] (Team Pager from 8am to [place])

## 2022-05-30 NOTE — H&P (Signed)
patient seen and examined in short stay.  He is clinically  stable hemoglobin 8.5 this morning.   no overt bleeding today.  Agree with need for push enteroscopy  given clinical scenario/capsule findings.  I discussed this approach with the patient along with the potential risk benefits and limitations.  He has done good with his GI evaluation thus far.  Explained to him that all of the AVMs that are present in his small bowel may not be treatable today.  Goal is to treat what we find.  Patient's questions have been answered.  He is agreeable.  Further recommendations to follow.

## 2022-05-30 NOTE — Anesthesia Preprocedure Evaluation (Addendum)
Anesthesia Evaluation  Patient identified by MRN, date of birth, ID band Patient awake    Reviewed: Allergy & Precautions, NPO status , Patient's Chart, lab work & pertinent test results  Airway Mallampati: II  TM Distance: >3 FB Neck ROM: Full    Dental  (+) Dental Advisory Given, Edentulous Upper, Missing   Pulmonary shortness of breath (on oxygen) and with exertion, sleep apnea and Continuous Positive Airway Pressure Ventilation , pneumonia, resolved, former smoker,    Pulmonary exam normal breath sounds clear to auscultation       Cardiovascular hypertension, Pt. on medications + CAD and +CHF  Normal cardiovascular exam+ dysrhythmias Atrial Fibrillation  Rhythm:Regular Rate:Normal  1. Left ventricular ejection fraction, by estimation, is 60 to 65%. The left ventricle has normal function. Left ventricular endocardial border  not optimally defined to evaluate regional wall motion. There is mild left ventricular hypertrophy. Left  ventricular diastolic parameters are indeterminate.  2. Right ventricular systolic function is normal. The right ventricular size is normal. Tricuspid regurgitation signal is inadequate for assessing PA pressure.  3. The mitral valve is abnormal. Mild mitral valve regurgitation. No evidence of mitral stenosis.  4. The aortic valve was not well visualized. There is mild calcification of the aortic valve. There is mild thickening of the aortic valve. Aortic valve regurgitation is not visualized. No aortic stenosis is present.   Neuro/Psych negative neurological ROS  negative psych ROS   GI/Hepatic negative GI ROS, Neg liver ROS,   Endo/Other  diabetes, Well Controlled, Type 2, Oral Hypoglycemic Agents, Insulin Dependent  Renal/GU Renal disease Bladder dysfunction (prostate cancer)      Musculoskeletal  (+) Arthritis , Osteoarthritis,    Abdominal   Peds negative pediatric ROS (+)   Hematology  (+) Blood dyscrasia, anemia ,   Anesthesia Other Findings The stress test has a lot of artifact. EF is low but was normal on recent echocardiogram. I reviewed with Dr. Meda Coffee. She reviewed the study and feels the it is low risk (ie - no significant change to suggest a significant blockage).  His HR was blunted on the treadmill and his BP did go very high.    PLAN:  - Decrease Carvedilol to 6.25 mg twice daily  - Start Amlodipine 5 mg once daily  - Keep follow up as planned  - Send Copy to PCP Richardson Dopp, PA-C   02/04/2020 10:22 AM   Reproductive/Obstetrics negative OB ROS                            Anesthesia Physical  Anesthesia Plan  ASA: 4  Anesthesia Plan: General   Post-op Pain Management: Minimal or no pain anticipated   Induction: Intravenous  PONV Risk Score and Plan:   Airway Management Planned: Nasal Cannula and Natural Airway  Additional Equipment:   Intra-op Plan:   Post-operative Plan:   Informed Consent: I have reviewed the patients History and Physical, chart, labs and discussed the procedure including the risks, benefits and alternatives for the proposed anesthesia with the patient or authorized representative who has indicated his/her understanding and acceptance.     Dental advisory given  Plan Discussed with: CRNA and Surgeon  Anesthesia Plan Comments:        Anesthesia Quick Evaluation

## 2022-05-30 NOTE — Anesthesia Postprocedure Evaluation (Signed)
Anesthesia Post Note  Patient: Lucas Dudley  Procedure(s) Performed: ENTEROSCOPY BIOPSY SUBMUCOSAL TATTOO INJECTION  Patient location during evaluation: PACU Anesthesia Type: General Level of consciousness: awake and alert Pain management: pain level controlled Vital Signs Assessment: post-procedure vital signs reviewed and stable Respiratory status: spontaneous breathing Cardiovascular status: blood pressure returned to baseline Postop Assessment: no apparent nausea or vomiting Anesthetic complications: no   No notable events documented.   Last Vitals:  Vitals:   05/30/22 1255 05/30/22 1402  BP: 137/65 (!) 114/58  Pulse: (!) 59 (!) 57  Resp: (!) 24 12  Temp:  36.7 C  SpO2: (!) 82% 93%    Last Pain:  Vitals:   05/30/22 1255  TempSrc: Oral  PainSc: 0-No pain                 Rubylee Zamarripa

## 2022-05-30 NOTE — Op Note (Signed)
[facility] Patient Name: Lucas Dudley Procedure Date: 05/30/2022 12:42 PM MRN: [id number] Date of Birth: 01-06-1941 Attending MD: Norvel Richards , MD CSN: [id number] Age: 81 Admit Type: Inpatient Procedure:                Small bowel enteroscopy Indications:              Obscure gastrointestinal bleeding; abnormal VCE Providers:                Norvel Richards, MD, Janeece Riggers, RN, Dereck Leep, Technician, Ladoris Gene Technician,                            Technician Referring MD:              Medicines:                Propofol per Anesthesia Complications:            No immediate complications. Estimated Blood Loss:     Estimated blood loss was minimal. Procedure:                Pre-Anesthesia Assessment:                           - Prior to the procedure, a History and Physical                            was performed, and patient medications and                            allergies were reviewed. The patient's tolerance of                            previous anesthesia was also reviewed. The risks                            and benefits of the procedure and the sedation                            options and risks were discussed with the patient.                            All questions were answered, and informed consent                            was obtained. Prior Anticoagulants: The patient                            last took Eliquis (apixaban) 7 days prior to the                            procedure. ASA Grade Assessment: III - A patient  with severe systemic disease. After reviewing the                            risks and benefits, the patient was deemed in                            satisfactory condition to undergo the procedure.                           After obtaining informed consent, the endoscope was                            passed under direct vision. Throughout the                             procedure, the patient's blood pressure, pulse, and                            oxygen saturations were monitored continuously. The                            [id number]-[id number] ([id number]) scope was introduced through                            the mouth and advanced to the mid-jejunum. The                            small bowel enteroscopy was accomplished without                            difficulty. The patient tolerated the procedure                            well. Scope In: 1:22:21 PM Scope Out: 1:56:48 PM Total Procedure Duration: 0 hours 34 minutes 27 seconds  Findings:      Esophagus appeared normal. Stomach empty. Gastric mucosa appeared       normal. Pylorus easily traversed. Examination of the small bowel ensued       utilizing the pediatric colonoscope. I believe the mid jejunum was       reached. The patient had 3 small innocent appearing AVMs in the distal       duodenum. They were ablated with APC circular probe 20 J each       application. In the approximate junction between the duodenum and the       jejunum there was a 2 to 2.5 cm irregular at least partially submucosal       mass. Please see photos. Biopsies of this nodule were taken. Multiple       specimen submitted. This lesion was demarcated with ink injected       submucosally approximately 3 cm distal to the lesion and 2 cm proximal       to it. Impression:               - Duodenal AVMs appeared innocent?"not very  impressive. Status post ablation. Small bowel                            nodule -junction distal duodenum/jejunum. Irregular                            with a submucosal component. Suspicious in                            appearance. Status post biopsy and inking Moderate Sedation:      Moderate (conscious) sedation was personally administered by an       anesthesia professional. The following parameters were monitored: oxygen       saturation, heart rate, blood pressure,  respiratory rate, EKG, adequacy       of pulmonary ventilation, and response to care. Recommendation:           - Clear liquid diet. Return to the floor for                            ongoing observation. Trend H&H. Follow-up on                            pathology. Depending on path findings, may benefit                            from undergoing a CTE. At patient request, I called                            [name] at [phone] and updated her on findings                            and recommendations today. Procedure Code(s):        --- Professional ---                           [phone], Small intestinal endoscopy, enteroscopy                            beyond second portion of duodenum, not including                            ileum; diagnostic, including collection of                            specimen(s) by brushing or washing, when performed                            (separate procedure) Diagnosis Code(s):        --- Professional ---                           K92.2, Gastrointestinal hemorrhage, unspecified CPT copyright 2019 American Medical Association. All rights reserved. The codes documented in this report are preliminary and upon coder review may  be revised to meet current compliance requirements. Cristopher Estimable. Gala Romney, MD Santo Held  Davy Westmoreland, MD 05/30/2022 2:30:05 PM This report has been signed electronically. Number of Addenda: 0

## 2022-05-31 DIAGNOSIS — K5520 Angiodysplasia of colon without hemorrhage: Secondary | ICD-10-CM

## 2022-05-31 DIAGNOSIS — D649 Anemia, unspecified: Secondary | ICD-10-CM | POA: Diagnosis not present

## 2022-05-31 DIAGNOSIS — K5521 Angiodysplasia of colon with hemorrhage: Principal | ICD-10-CM

## 2022-05-31 LAB — GLUCOSE, CAPILLARY
Glucose-Capillary: 184 mg/dL — ABNORMAL HIGH (ref 70–99)
Glucose-Capillary: 243 mg/dL — ABNORMAL HIGH (ref 70–99)
Glucose-Capillary: 214 mg/dL — ABNORMAL HIGH (ref 70–99)
Glucose-Capillary: 194 mg/dL — ABNORMAL HIGH (ref 70–99)

## 2022-05-31 LAB — CBC
HCT: 30.5 % — ABNORMAL LOW (ref 39.0–52.0)
Hemoglobin: 9.2 g/dL — ABNORMAL LOW (ref 13.0–17.0)
MCH: 25.3 pg — ABNORMAL LOW (ref 26.0–34.0)
MCHC: 30.2 g/dL (ref 30.0–36.0)
MCV: 84 fL (ref 80.0–100.0)
Platelets: 392 10*3/uL (ref 150–400)
RBC: 3.63 MIL/uL — ABNORMAL LOW (ref 4.22–5.81)
RDW: 16.5 % — ABNORMAL HIGH (ref 11.5–15.5)
WBC: 10.4 10*3/uL (ref 4.0–10.5)
nRBC: 0 % (ref 0.0–0.2)

## 2022-05-31 LAB — BASIC METABOLIC PANEL
Anion gap: 11 (ref 5–15)
BUN: 18 mg/dL (ref 8–23)
CO2: 38 mmol/L — ABNORMAL HIGH (ref 22–32)
Calcium: 8.8 mg/dL — ABNORMAL LOW (ref 8.9–10.3)
Chloride: 87 mmol/L — ABNORMAL LOW (ref 98–111)
Creatinine, Ser: 1.2 mg/dL (ref 0.61–1.24)
GFR, Estimated: >60 mL/min (ref >60)
Glucose, Bld: 147 mg/dL — ABNORMAL HIGH (ref 70–99)
Potassium: 3.4 mmol/L — ABNORMAL LOW (ref 3.5–5.1)
Sodium: 136 mmol/L (ref 135–145)

## 2022-05-31 LAB — SURGICAL PATHOLOGY

## 2022-05-31 LAB — MAGNESIUM: Magnesium: 1.7 mg/dL (ref 1.7–2.4)

## 2022-05-31 MED ORDER — TORSEMIDE 20 MG PO TABS
20.0000 mg | ORAL_TABLET | Freq: Two times a day (BID) | ORAL | Status: DC
  Administered 2022-05-31 (×2): 20 mg via ORAL
  Filled 2022-05-31 (×2): qty 1

## 2022-05-31 MED ORDER — POTASSIUM CHLORIDE CRYS ER 20 MEQ PO TBCR
40.0000 meq | EXTENDED_RELEASE_TABLET | Freq: Two times a day (BID) | ORAL | Status: DC
  Administered 2022-05-31 – 2022-06-01 (×3): 40 meq via ORAL
  Filled 2022-05-31 (×3): qty 2

## 2022-05-31 MED ORDER — INSULIN GLARGINE-YFGN 100 UNIT/ML ~~LOC~~ SOLN
12.0000 [IU] | Freq: Every day | SUBCUTANEOUS | Status: DC
  Administered 2022-06-01: 12 [IU] via SUBCUTANEOUS
  Filled 2022-05-31 (×2): qty 0.12

## 2022-05-31 NOTE — Progress Notes (Signed)
PROGRESS NOTE    Lucas Dudley  DPO:[id number] DOB: 13-Jun-1941 DOA: 05/21/2022 PCP: [name], MD   Brief Narrative:    Lucas Dudley is a 81 y.o. male with medical history significant for atrial fibrillation on chronic anticoagulation, systolic CHF, hypertension, diabetes. Patient went to see his cardiologist today, had blood work drawn, which showed hemoglobin of 7.6, was referred to the ED. he has been admitted with acute anemia and upper and lower endoscopy completed on 8/10 with no significant findings.  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  He continued to have declining hemoglobin levels and required another 1 unit PRBC transfusion and Eliquis was held.  He was noted to be volume overloaded and required aggressive IV diuresis with overall improvement in volume status.  He did undergo push enteroscopy on 8/16 with some small AVMs noted and biopsy obtained with no significant findings.  His hemoglobin levels remained stable and continue to improve.  Assessment & Plan:   Principal Problem:   Symptomatic anemia Active Problems:   Pneumonia   Acute respiratory failure with hypoxia (HCC)   Essential hypertension   Diabetes mellitus with no complication (HCC)   OSA (obstructive sleep apnea)   Chronic systolic dysfunction of left ventricle   A-fib (HCC)   Obesity, Class III, BMI 40-49.9 (morbid obesity) ([place])   AVM (arteriovenous malformation) of small bowel, acquired with hemorrhage  Assessment and Plan:   Acute anemia-improved -Acute symptomatic anemia, hemoglobin downtrending yet again after PRBC transfusion 8/14 12-13 last checked about 6 months ago. -Patient is chronically on Eliquis and reported no frank overt bleeding appreciated.  -Fecal occult blood test was positive.  -s/p EGD and colonoscopy 8/10 with ongoing video capsule demonstrating some AVMs, but no acute bleeding -Continue to follow hemoglobin  trend -Continue PPI -Follow CBC and discontinue Eliquis for now after discussion with GI and cardiology -Started iron supplementation with stool softener -No overt bleeding noted, GI I had performed push enteroscopy 8/16 with APC of small AVMs and biopsy with benign findings.  Advance diet today. -Hemolysis work-up ordered 8/15 and no need to be negative   Pneumonia-resolved -Symptomatic with productive cough with dyspnea at time of admission (which may be secondary to pneumonia versus anemia).   -Sepsis rule out  -Normal WBCs currently  -No requiring oxygen supplementation  -Reports still intermittent coughing spells (nonproductive).   -Follow final culture results; continue current antibiotics and start flutter valve.   -Continue as needed mucolytic/antitussive medication. -Patient expressed dyspnea is better already after transfusion. -Completed course of antibiotics -CT abdomen pelvis with findings of right-sided rib fractures as well as pleural effusion and pneumonia 8/15.  No other acute findings noted   Acute respiratory failure with hypoxia (HCC) -O2 sats down to 89% on room air at time of admission -Chest x-ray suggested pneumonia versus atelectasis -Decrease in patient's oxygen saturation could be related with anemia. -After transfusion good saturation appreciated on room air and patient expressing improvement in his shortness of breath symptoms. -Empirically receiving treatment for possible bronchitis/pneumonia. -Follow clinical response -Noted to have volume overload 8/11 on chest x-ray and started on IV Lasix twice daily   Obesity, Class III, BMI 40-49.9 (morbid obesity) ([place]) -Body mass index is 36.48 kg/m. -Low calorie diet, portion control and increase physical activity discussed with patient.   A-fib ([place]) -Rate controlled, on amiodarone and carvedilol  -Now holding Eliquis again due to worsening anemia, discussed with  cardiology on 8/14 -Follow GI service  clearance for resumption.   -Continue telemetry monitoring.     Acute on chronic systolic dysfunction of left ventricle -BNP stable at 166.  -Last echo 07/2021, EF of 60 to 65% with indeterminate LV diastolic parameters. -Continue beta-blocker and IV Lasix changed to oral torsemide 20 mg twice daily -Follow daily weights and strict I's and O's.   OSA (obstructive sleep apnea) -Continue the use of CPAP nightly   Diabetes mellitus with hyperglycemia -Reports history of hypoglycemia, with recent insulin dose adjusted by PCP. -Holding oral hypoglycemic agents and Ozempic while inpatient -Continue sliding scale insulin -Follow CBGs and further adjust hypoglycemic management as needed. -Added Semglee and mealtime insulin 8/14 per coordinator recommendations -A1c 8.0   Essential hypertension - Stable overall -Continue now on torsemide 20 mg daily -Heart healthy diet discussed with patient.   Hypokalemia -Repleted; monitor     DVT prophylaxis: SCDs Code Status: Full Family Communication: Discussed with daughter on phone 8/17 Disposition Plan:  Status is: Inpatient Remains inpatient appropriate because: Need for inpatient procedure     Consultants:  GI   Procedures:  EGD and colonoscopy 8/10 Capsule study 8/11   Antimicrobials:  Anti-infectives (From admission, onward)    Start     Dose/Rate Route Frequency Ordered Stop   05/21/22 2200  valACYclovir (VALTREX) tablet 1,000 mg        1,000 mg Oral Daily at bedtime 05/21/22 1859     05/21/22 2000  doxycycline (VIBRAMYCIN) 100 mg in sodium chloride 0.9 % 250 mL IVPB  Status:  Discontinued        100 mg 125 mL/hr over 120 Minutes Intravenous Every 12 hours 05/21/22 1845 05/27/22 0747   05/21/22 1830  azithromycin (ZITHROMAX) 500 mg in sodium chloride 0.9 % 250 mL IVPB  Status:  Discontinued        500 mg 250 mL/hr over 60 Minutes Intravenous Every 24 hours 05/21/22 1731 05/21/22 1845   05/21/22 1800  cefTRIAXone (ROCEPHIN) 2  g in sodium chloride 0.9 % 100 mL IVPB        2 g 200 mL/hr over 30 Minutes Intravenous Every 24 hours 05/21/22 1731 05/25/22 1901      Subjective: Patient seen and evaluated today with no new acute complaints or concerns. No acute concerns or events noted overnight.  Objective: Vitals:   05/30/22 2103 05/31/22 0454 05/31/22 0815 05/31/22 1100  BP: (!) 133/53 138/61 125/77   Pulse: (!) 50 (!) 57 (!) 58   Resp: 20 20    Temp: 97.9 F (36.6 C) 98.6 F (37 C)    TempSrc: Oral     SpO2: 95% 100% 100% 95%  Weight:      Height:        Intake/Output Summary (Last 24 hours) at 05/31/2022 1219 Last data filed at 05/31/2022 1100 Gross per 24 hour  Intake 880 ml  Output 1675 ml  Net -795 ml   Filed Weights   05/28/22 0507 05/29/22 0425 05/30/22 0527  Weight: 110.4 kg 110.8 kg 107.7 kg    Examination:  General exam: Appears calm and comfortable  Respiratory system: Clear to auscultation. Respiratory effort normal.  Currently on nasal cannula oxygen. Cardiovascular system: S1 & S2 heard, RRR.  Gastrointestinal system: Abdomen is soft Central nervous system: Alert and awake Extremities: No edema Skin: No significant lesions noted Psychiatry: Flat affect.    Data Reviewed: I have personally reviewed following labs and imaging studies  CBC: Recent Labs  Lab 05/27/22 0535 05/27/22 1059 05/28/22 0534 05/28/22 1638 05/29/22 0423 05/29/22 1221 05/30/22 0428 05/31/22 0357  WBC 13.2*  --  11.0*  --  9.4  --  8.7 10.4  NEUTROABS  --   --   --   --   --  7.5  --   --   HGB 7.9*   < > 7.4* 8.7* 8.0* 8.7* 8.5* 9.2*  HCT 26.4*   < > 25.2* 28.8* 26.2* 28.4* 27.7* 30.5*  MCV 82.5  --  84.3  --  83.4  --  83.2 84.0  PLT 339  --  336  --  320  --  341 392   < > = values in this interval not displayed.   Basic Metabolic Panel: Recent Labs  Lab 05/27/22 0535 05/28/22 0534 05/29/22 0423 05/30/22 0428 05/31/22 0357  NA 136 136 135 136 136  K 3.4* 4.4 3.7 3.4* 3.4*  CL 99  100 98 92* 87*  CO2 '27 30 29 '$ 33* 38*  GLUCOSE 192* 217* 189* 197* 147*  BUN '15 19 20 22 18  '$ CREATININE 0.97 1.04 1.02 1.13 1.20  CALCIUM 8.4* 8.6* 8.5* 8.9 8.8*  MG 1.7 1.5* 1.5* 1.6* 1.7   GFR: Estimated Creatinine Clearance: 61.4 mL/min (by C-G formula based on SCr of 1.2 mg/dL). Liver Function Tests: Recent Labs  Lab 05/29/22 1221  BILITOT 0.6   No results for input(s): "LIPASE", "AMYLASE" in the last 168 hours. No results for input(s): "AMMONIA" in the last 168 hours. Coagulation Profile: No results for input(s): "INR", "PROTIME" in the last 168 hours. Cardiac Enzymes: No results for input(s): "CKTOTAL", "CKMB", "CKMBINDEX", "TROPONINI" in the last 168 hours. BNP (last 3 results) No results for input(s): "PROBNP" in the last 8760 hours. HbA1C: No results for input(s): "HGBA1C" in the last 72 hours. CBG: Recent Labs  Lab 05/30/22 1100 05/30/22 1653 05/30/22 2105 05/31/22 0707 05/31/22 1103  GLUCAP 224* 265* 170* 184* 243*   Lipid Profile: No results for input(s): "CHOL", "HDL", "LDLCALC", "TRIG", "CHOLHDL", "LDLDIRECT" in the last 72 hours. Thyroid Function Tests: No results for input(s): "TSH", "T4TOTAL", "FREET4", "T3FREE", "THYROIDAB" in the last 72 hours. Anemia Panel: Recent Labs    05/30/22 0428  VITAMINB12 624  FOLATE 13.5  FERRITIN 42  TIBC 344  IRON 18*   Sepsis Labs: No results for input(s): "PROCALCITON", "LATICACIDVEN" in the last 168 hours.  No results found for this or any previous visit (from the past 240 hour(s)).       Radiology Studies: No results found.      Scheduled Meds:  amiodarone  200 mg Oral Daily   brimonidine  1 drop Both Eyes BID   carvedilol  6.25 mg Oral BID WC   docusate sodium  100 mg Oral Daily   ferrous sulfate  325 mg Oral Q breakfast   gemfibrozil  600 mg Oral QHS   insulin aspart  0-15 Units Subcutaneous TID WC   insulin aspart  0-5 Units Subcutaneous QHS   insulin aspart  7 Units Subcutaneous TID WC    [START ON 06/01/2022] insulin glargine-yfgn  12 Units Subcutaneous Daily   melatonin  6 mg Oral QHS   pantoprazole (PROTONIX) IV  40 mg Intravenous Q12H   potassium chloride  40 mEq Oral BID   timolol  1 drop Both Eyes BID   torsemide  20 mg Oral BID   valACYclovir  1,000 mg Oral QHS     LOS: 9 days  Time spent: 35 minutes    Lucas Neidlinger Darleen Crocker, DO [facility]  If 7PM-7AM, please contact night-coverage [email or web address] 05/31/2022, 12:19 PM

## 2022-05-31 NOTE — Inpatient Diabetes Management (Signed)
Inpatient Diabetes Program Recommendations  AACE/ADA: New Consensus Statement on Inpatient Glycemic Control   Target Ranges:  Prepandial:   less than 140 mg/dL      Peak postprandial:   less than 180 mg/dL (1-2 hours)      Critically ill patients:  140 - 180 mg/dL    Review of Glycemic Control  Latest Reference Range & Units 05/30/22 07:18 05/30/22 11:00 05/30/22 16:53 05/30/22 21:05 05/31/22 07:07 05/31/22 11:03  Glucose-Capillary 70 - 99 mg/dL 205 (H) 224 (H) 265 (H) 170 (H) 184 (H) 243 (H)   Diabetes history: DM2 Outpatient Diabetes medications: Amaryl 2 mg daily, Basaglar 30 units QHS, Metformin XR 1000 mg BID, Ozempic 1 mg Qweek Current orders for Inpatient glycemic control: Semglee 8 units daily, Novolog 0-15 units TID with meals, Novolog 0-5 units QHS, Novolog 7 units TID with meals   Inpatient Diabetes Program Recommendations:     Insulin: Please consider increasing Semglee to 12 units daily.  Thanks, Barnie Alderman, RN, MSN, CDCES Tama Headings RN, MSN, BC-ADM Inpatient Diabetes Coordinator Team Pager [phone] (8a-5p)

## 2022-05-31 NOTE — Progress Notes (Signed)
Gastroenterology Progress Note   Referring Provider: No ref. provider found Primary Care Physician:  [name], MD Primary Gastroenterologist:  previously Dr. Laural Golden  Patient ID: Lucas Dudley; [id number]; [birth year]   Subjective:    Large dark stool after procedure yesterday. Still on clear liquid diet. Not really hungry but would like to have solid food. No brbpr. No abdominal pain.  Objective:   Vital signs in last 24 hours: Temp:  [97.9 F (36.6 C)-98.7 F (37.1 C)] 98.6 F (37 C) (08/17 0454) Pulse Rate:  [47-59] 57 (08/17 0454) Resp:  [12-24] 20 (08/17 0454) BP: (114-143)/(49-65) 138/61 (08/17 0454) SpO2:  [82 %-100 %] 100 % (08/17 0454) Last BM Date : 05/31/22 General:   Alert,  Well-developed, well-nourished, pleasant and cooperative in NAD Head:  Normocephalic and atraumatic. Eyes:  Sclera clear, no icterus.  Abdomen:  Soft, nontender and nondistended. Normal bowel sounds, without guarding, and without rebound.   Extremities:  1+ edema bilaterally. Neurologic:  Alert and  oriented x4;  grossly normal neurologically. Skin:  Intact without significant lesions or rashes. Psych:  Alert and cooperative. Normal mood and affect.  Intake/Output from previous day: 08/16 0701 - 08/17 0700 In: 880 [P.O.:480; I.V.:400] Out: 1750 [Urine:1750] Intake/Output this shift: No intake/output data recorded.  Lab Results: CBC Recent Labs    05/29/22 0423 05/29/22 1221 05/30/22 0428 05/31/22 0357  WBC 9.4  --  8.7 10.4  HGB 8.0* 8.7* 8.5* 9.2*  HCT 26.2* 28.4* 27.7* 30.5*  MCV 83.4  --  83.2 84.0  PLT 320  --  341 392   BMET Recent Labs    05/29/22 0423 05/30/22 0428 05/31/22 0357  NA 135 136 136  K 3.7 3.4* 3.4*  CL 98 92* 87*  CO2 29 33* 38*  GLUCOSE 189* 197* 147*  BUN '20 22 18  '$ CREATININE 1.02 1.13 1.20  CALCIUM 8.5* 8.9 8.8*   LFTs Recent Labs    05/29/22 1221  BILITOT 0.6  BILIDIR 0.1  IBILI 0.5   No results for input(s): "LIPASE" in the  last 72 hours. PT/INR No results for input(s): "LABPROT", "INR" in the last 72 hours.       Imaging Studies: CT ABDOMEN PELVIS W CONTRAST  Result Date: 05/28/2022 CLINICAL DATA:  Anemia. Intermittent diarrhea and constipation. Abdominal pain. EXAM: CT ABDOMEN AND PELVIS WITH CONTRAST TECHNIQUE: Multidetector CT imaging of the abdomen and pelvis was performed using the standard protocol following bolus administration of intravenous contrast. RADIATION DOSE REDUCTION: This exam was performed according to the departmental dose-optimization program which includes automated exposure control, adjustment of the mA and/or kV according to patient size and/or use of iterative reconstruction technique. CONTRAST:  126m OMNIPAQUE IOHEXOL 300 MG/ML  SOLN COMPARISON:  Report from noncontrast abdominal CT from 06/18/2012, and cardiac CT from 05/31/2021 FINDINGS: Lower chest: Prominence of fatty tissues in the anterior mediastinum/pericardial space, potentially mediastinal lipomatosis or a lipoma in the pericardial adipose tissues. Mild cardiomegaly. Small right pleural effusion. Patchy ground-glass and airspace opacity in the right lower lobe suspicious for pneumonia. Mild mosaic attenuation at the lung bases and mild atelectasis along the right hemidiaphragm in the right middle lobe. Coronary artery and descending thoracic aortic atherosclerosis. Suspected subcarinal adenopathy, 2.1 cm in short axis on image 1 series 3. Hepatobiliary: Cholecystectomy.  Otherwise unremarkable. Pancreas: Unremarkable Spleen: Unremarkable Adrenals/Urinary Tract: Both adrenal glands appear normal. 1.1 cm in diameter Bosniak category 1 exophytic cyst from the right kidney upper pole. 1.8 by 1.3 cm Bosniak category  1 cyst of the right kidney lower pole. 1.7 by 0.9 cm Bosniak category 1 cyst of the left mid kidney. None of these require further imaging follow up. 0.8 cm left kidney lower pole nonobstructive renal calculus. No other urinary  tract calculi are observed. No hydronephrosis or hydroureter. Stomach/Bowel: Unremarkable Vascular/Lymphatic: Atherosclerosis is present, including aortoiliac atherosclerotic disease. Reproductive: Prostatectomy. Mild fatty prominence along the right spermatic cord. Other: No supplemental non-categorized findings. Musculoskeletal: Ventral hernia mesh noted. Subacute healing fractures of the right lateral seventh, eighth, ninth, and tenth ribs. Additional older healed rib fractures are noted. 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 Loss of intervertebral disc height and Schmorl's nodes noted at all levels between L2 and L5. IMPRESSION: 1. Airspace opacity in the right lower lobe suspicious for pneumonia, with mild mosaic attenuation at the lung bases. Small right pleural effusion. 2. Early healing response of fractures of the right lateral seventh, eighth, ninth, and tenth ribs. 3. Stable subcarinal adenopathy at 2.1 cm in short axis, cause uncertain. 4. Other imaging findings of potential clinical significance: Mediastinal lipomatosis. Mild cardiomegaly. Aortic Atherosclerosis (ICD10-I70.0). Coronary atherosclerosis. Nonobstructive left nephrolithiasis. Prostatectomy. Ventral hernia mesh. Lumbar impingement at L3-4, L4-5, and L5-S1. Electronically Signed   By: Van Clines M.D.   On: 05/28/2022 18:26   DG CHEST PORT 1 VIEW  Result Date: 05/25/2022 CLINICAL DATA:  History. EXAM: PORTABLE CHEST 1 VIEW COMPARISON:  Prior chest radiographs 05/21/2022 and earlier. FINDINGS: Enlarged appearance of the cardiopericardial silhouette. Aortic atherosclerosis. Chronic elevation of the right hemidiaphragm. Streaky and ill-defined opacities within the right mid and lower lung field, progressed from the prior examination. Persistent small right  pleural effusion. No evidence of pneumothorax. Known subacute, displaced fractures of the right lateral ninth and tenth ribs. Soft tissue anchors within the proximal right humerus. IMPRESSION: Enlarged appearance of the cardiopericardial silhouette, which may reflect cardiomegaly and/or the presence of a pericardial effusion. Streaky and ill-defined opacities within the right mid and lower lung field, progressed. Findings may reflect atelectasis or pneumonia. Small right pleural effusion. Chronic elevation of the right hemidiaphragm. Aortic Atherosclerosis (ICD10-I70.0). Electronically Signed   By: Kellie Simmering D.O.   On: 05/25/2022 08:33   DG Chest 2 View  Result Date: 05/21/2022 CLINICAL DATA:  Shortness of breath EXAM: CHEST - 2 VIEW COMPARISON:  03/13/2022 FINDINGS: Globular enlargement of the cardiopericardial silhouette, similar in appearance to prior. Aortic atherosclerosis. Chronic elevation of the right hemidiaphragm with streaky right basilar opacity. Small right pleural effusion. No pneumothorax. Minimally displaced subacute right ninth and tenth rib fractures. IMPRESSION: 1. Small right pleural effusion with streaky right basilar opacity, which may represent atelectasis versus pneumonia. 2. Globular enlargement of the cardiopericardial silhouette, similar to prior. Underlying pericardial effusion not excluded. Electronically Signed   By: Davina Poke D.O.   On: 05/21/2022 15:14   Intravitreal Injection, Pharmacologic Agent - OS - Left Eye  Result Date: 05/06/2022 Time Out 05/04/2022. 3:00 PM. Confirmed correct patient, procedure, site, and patient consented. Anesthesia Topical anesthesia was used. Anesthetic medications included Lidocaine 2%, Proparacaine 0.5%. Procedure Preparation included 5% betadine to ocular surface, eyelid speculum. A (32g) needle was used. Injection: 6 mg faricimab-svoa 6 MG/0.05ML   Route: Intravitreal, Site: Left Eye   NDC: [id number], Lot: [id number], Expiration date:  05/15/2023, Waste: 0 mL Post-op Post injection exam found visual acuity of at least counting  fingers. The patient tolerated the procedure well. There were no complications. The patient received written and verbal post procedure care education. Post injection medications were not given.   OCT, Retina - OU - Both Eyes  Result Date: 05/06/2022 Right Eye Quality was good. Central Foveal Thickness: 360. Progression has worsened. Findings include no SRF, abnormal foveal contour, intraretinal fluid, vitreomacular adhesion (Mild interval improvement in IRF superior and temporal fovea and macula ). Left Eye Quality was good. Central Foveal Thickness: 307. Progression has been stable. Findings include abnormal foveal contour, intraretinal hyper-reflective material, intraretinal fluid (Persistent IRF/cystic changes; partial PVD). 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 CME - cystoid macular edema. PED - pigment epithelial detachment. IRF - intraretinal fluid. SRF - subretinal fluid. EZ - ellipsoid zone. ERM - epiretinal membrane. ORA - outer retinal atrophy. ORT - outer retinal tubulation. SRHM - subretinal hyper-reflective material. IRHM - intraretinal hyper-reflective material [2 weeks]  Assessment:   81 y/o male with Afib on chronic anticoagulation, systolic CHF, DM who presented to ED 8/7 for Hgb of 7.6, down from 13.1 (6 months prior), associated with fatigue/SOB. In ED, heme + stool, possible PNA on CXR.  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 Colonoscopy unremarkable. 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 Capsule did not reach cecum. Small bowel Enteroscopy 05/30/22: duodenal AVMs appeared innocent s/p ablation. Small bowel nodule-junction distal duodenum/jejunum. Irregular with submucosal component. Suspicious in appearance. S/p biopsy and inking. Pathology with benign findings.   CT A/P with contrast: right lower lobe airspace disease ?PNA. Early healing of fractures of right lateral 7,8,9,10th ribs. No mention of retained capsule.  He has received 2 units of prbcs this admission. Current Hgb 9.2. B12, folate, ferritin normal. Serum iron and iron sats low.  Plan:   Advance diet to carb modified. May require CTE, entire small bowel not seen via capsule. Can be done as outpatient.  Continue PPI BID.  Keep upcoming appointment with Scherrie Gerlach, NP.   LOS: 9 days   Laureen Ochs. Bernarda Caffey [facility] [phone] 8/17/20237:09 AM

## 2022-05-31 NOTE — Progress Notes (Signed)
Patient using hospital CPAP independently. Checked to ensure machine was setup correctly and in working order. Patient told to call if he needed help.

## 2022-05-31 NOTE — Care Management Important Message (Signed)
Important Message  Patient Details  Name: Lucas Dudley MRN: [id number] Date of Birth: 1941-04-17   Medicare Important Message Given:  Yes     Lucas Dudley 05/31/2022, 3:43 PM

## 2022-05-31 NOTE — Progress Notes (Signed)
Patient slept thorough the night only waking when this nurse took him his scheduled medications and when vitals need to be taken.

## 2022-05-31 NOTE — Progress Notes (Signed)
O2 sats on room air in low 90's.  Encouraged to deep breath, cough and use incentive.  Held coreg for pulse 57.  Tolerating carb mod diet.  Reports not having bm today.

## 2022-06-01 ENCOUNTER — Telehealth: Payer: Self-pay | Admitting: Cardiology

## 2022-06-01 DIAGNOSIS — D649 Anemia, unspecified: Secondary | ICD-10-CM | POA: Diagnosis not present

## 2022-06-01 LAB — BASIC METABOLIC PANEL
Anion gap: 11 (ref 5–15)
BUN: 22 mg/dL (ref 8–23)
CO2: 34 mmol/L — ABNORMAL HIGH (ref 22–32)
Calcium: 8.8 mg/dL — ABNORMAL LOW (ref 8.9–10.3)
Chloride: 90 mmol/L — ABNORMAL LOW (ref 98–111)
Creatinine, Ser: 1.36 mg/dL — ABNORMAL HIGH (ref 0.61–1.24)
GFR, Estimated: 53 mL/min — ABNORMAL LOW (ref >60)
Glucose, Bld: 191 mg/dL — ABNORMAL HIGH (ref 70–99)
Potassium: 3.6 mmol/L (ref 3.5–5.1)
Sodium: 135 mmol/L (ref 135–145)

## 2022-06-01 LAB — CBC
HCT: 30.6 % — ABNORMAL LOW (ref 39.0–52.0)
Hemoglobin: 9.2 g/dL — ABNORMAL LOW (ref 13.0–17.0)
MCH: 25.1 pg — ABNORMAL LOW (ref 26.0–34.0)
MCHC: 30.1 g/dL (ref 30.0–36.0)
MCV: 83.4 fL (ref 80.0–100.0)
Platelets: 382 10*3/uL (ref 150–400)
RBC: 3.67 MIL/uL — ABNORMAL LOW (ref 4.22–5.81)
RDW: 16.7 % — ABNORMAL HIGH (ref 11.5–15.5)
WBC: 7.8 10*3/uL (ref 4.0–10.5)
nRBC: 0 % (ref 0.0–0.2)

## 2022-06-01 LAB — GLUCOSE, CAPILLARY
Glucose-Capillary: 204 mg/dL — ABNORMAL HIGH (ref 70–99)
Glucose-Capillary: 251 mg/dL — ABNORMAL HIGH (ref 70–99)

## 2022-06-01 LAB — MAGNESIUM: Magnesium: 1.6 mg/dL — ABNORMAL LOW (ref 1.7–2.4)

## 2022-06-01 MED ORDER — MAGNESIUM SULFATE 2 GM/50ML IV SOLN
2.0000 g | Freq: Once | INTRAVENOUS | Status: AC
  Administered 2022-06-01: 2 g via INTRAVENOUS
  Filled 2022-06-01: qty 50

## 2022-06-01 MED ORDER — DOCUSATE SODIUM 100 MG PO CAPS: 100.0000 mg | ORAL_CAPSULE | Freq: Every day | ORAL | 2 refills | Status: AC

## 2022-06-01 MED ORDER — TORSEMIDE 20 MG PO TABS: 20.0000 mg | ORAL_TABLET | Freq: Two times a day (BID) | ORAL | 2 refills | Status: DC

## 2022-06-01 MED ORDER — FERROUS SULFATE 325 (65 FE) MG PO TABS: 325.0000 mg | ORAL_TABLET | Freq: Every day | ORAL | 0 refills | Status: DC

## 2022-06-01 MED ORDER — PANTOPRAZOLE SODIUM 40 MG PO TBEC: 40.0000 mg | DELAYED_RELEASE_TABLET | Freq: Two times a day (BID) | ORAL | 0 refills | Status: DC

## 2022-06-01 NOTE — TOC Transition Note (Signed)
Transition of Care [facility]) - CM/SW Discharge Note   Patient Details  Name: Lucas Dudley MRN: [id number] Date of Birth: 10/25/1940  Transition of Care [facility]) CM/SW Contact:  Boneta Lucks, RN Phone Number: 06/01/2022, 11:02 AM   Clinical Narrative:   Patient discharging home. PT is recommending HHPT. Patient is agreeable.  Marjory Lies with [facility] accepted the referral. MD aware to order.    Final next level of care: [place] Barriers to Discharge: Barriers Resolved  Patient Goals and CMS Choice Patient states their goals for this hospitalization and ongoing recovery are:: to go home . CMS Medicare.gov Compare Post Acute Care list provided to:: Patient Choice offered to / list presented to : Patient  Discharge Placement          Patient and family notified of of transfer: 06/01/22  Discharge Plan and Services    HH Arranged: PT [facility] Agency: [place] Date [place]: 06/01/22 Time [place]: 1100 Representative spoke with at [place]: Marjory Lies  Readmission Risk Interventions    06/01/2022   11:02 AM  Readmission Risk Prevention Plan  Transportation Screening Complete  PCP or Specialist Appt within 3-5 Days Complete  HRI or [place] Complete  Social Work Consult for [place] Planning/Counseling Complete  Palliative Care Screening Complete  Medication Review [employer]) Complete

## 2022-06-01 NOTE — Discharge Summary (Signed)
Physician Discharge Summary  Lucas Dudley ZOX:[id number] DOB: 08/25/1941 DOA: 05/21/2022  PCP: Lucas Evens, MD  Admit date: 05/21/2022  Discharge date: 06/01/2022  Admitted From:Home  Disposition:  Home  Recommendations for Outpatient Follow-up:  Follow up with PCP in 1-2 weeks Recheck hemoglobin and BMP/magnesium in 5-7 days Follow-up with GI as scheduled on 8/25 and continue PPI twice daily as prescribed Follow-up with cardiology Dr. Domenic Dudley as scheduled 11/14 with referral sent to see if earlier visit could be arranged to determine appropriateness to resume home anticoagulation for atrial fibrillation Continue to hold Eliquis for now Discontinue Lasix and start on torsemide 20 mg twice daily Hold amlodipine and lisinopril for now given controlled blood pressure readings during hospitalization with aggressive diuresis Continue other home medications as prior  Home Health: None  Equipment/Devices: None  Discharge Condition:Stable  CODE STATUS: Full  Diet recommendation: Heart Healthy/carb modified  Brief/Interim Summary: Lucas Dudley is a 81 y.o. male with medical history significant for atrial fibrillation on chronic anticoagulation, systolic CHF, hypertension, diabetes. Patient went to see his cardiologist today, had blood work drawn, which showed hemoglobin of 7.6, was referred to the ED. he has been admitted with acute anemia and upper and lower endoscopy completed on 8/10 with no significant findings.  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  He continued to have declining hemoglobin levels and required another 1 unit PRBC transfusion and Eliquis was held.  He was noted to be volume overloaded and required aggressive IV diuresis with overall improvement in volume status.  He did undergo push enteroscopy on 8/16 with some small AVMs noted and biopsy obtained with no significant findings.  His hemoglobin levels  remained stable and continue to improve.  His overall volume status has improved as well and he is now euvolemic and no longer requiring oxygen.  He is in stable condition for discharge with plans to continue to hold anticoagulation and remain on PPI twice daily with follow-up to GI as noted above.  No other acute events noted throughout the course of this admission.  Discharge Diagnoses:  Principal Problem:   Symptomatic anemia Active Problems:   Pneumonia   Acute respiratory failure with hypoxia (HCC)   Essential hypertension   Diabetes mellitus with no complication (HCC)   OSA (obstructive sleep apnea)   Chronic systolic dysfunction of left ventricle   A-fib (HCC)   Obesity, Class III, BMI 40-49.9 (morbid obesity) ([place])   AVM (arteriovenous malformation) of small bowel, acquired with hemorrhage  Principal discharge diagnosis: Acute symptomatic blood loss anemia requiring PRBC transfusion in the setting of AVMs and chronic anticoagulation for atrial fibrillation.  Acute hypoxemic respiratory failure-multifactorial along with acute on chronic systolic CHF exacerbation requiring prolonged diuresis.  Discharge Instructions  Discharge Instructions     Ambulatory referral to Cardiology   Complete by: As directed    Diet - low sodium heart healthy   Complete by: As directed    Increase activity slowly   Complete by: As directed    No wound care   Complete by: As directed       Allergies as of 06/01/2022       Reactions   Azithromycin Nausea Only   Tape Rash, Other (See Comments)   Causes skin redness, Use paper tape only.        Medication List     STOP taking these medications    amLODipine 10 MG tablet  Commonly known as: NORVASC   Eliquis 5 MG Tabs tablet Generic drug: apixaban   furosemide 40 MG tablet Commonly known as: LASIX   lisinopril 40 MG tablet Commonly known as: ZESTRIL   VITAMIN D-3 PO       TAKE these medications    albuterol 108 (90 Base)  MCG/ACT inhaler Commonly known as: VENTOLIN HFA Inhale 2 puffs into the lungs every 4 (four) hours as needed for wheezing or shortness of breath.   amiodarone 200 MG tablet Commonly known as: PACERONE TAKE 1 TABLET BY MOUTH EVERY DAY   Basaglar KwikPen 100 UNIT/ML Inject 35 Units into the skin at bedtime. What changed: how much to take   carvedilol 6.25 MG tablet Commonly known as: COREG Takes 1 tablet by mouth in the AM and 1 tablet by mouth in the PM   Combigan 0.2-0.5 % ophthalmic solution Generic drug: brimonidine-timolol Apply 1 drop to eye 2 (two) times daily.   CRANBERRY PO Take 1 tablet by mouth daily.   diphenhydramine-acetaminophen 25-500 MG Tabs tablet Commonly known as: TYLENOL PM Take 2 tablets by mouth at bedtime.   docusate sodium 100 MG capsule Commonly known as: Colace Take 1 capsule (100 mg total) by mouth daily.   ferrous sulfate 325 (65 FE) MG tablet Take 1 tablet (325 mg total) by mouth daily with breakfast. Start taking on: June 02, 2022   gemfibrozil 600 MG tablet Commonly known as: LOPID Take 600 mg by mouth at bedtime.   glimepiride 2 MG tablet Commonly known as: AMARYL Take 2 mg by mouth daily with lunch.   Klor-Con M20 20 MEQ tablet Generic drug: potassium chloride SA TAKE 1 TABLET BY MOUTH EVERY DAY   Melatonin 10 MG Tabs Take 10 mg by mouth at bedtime.   metFORMIN 500 MG 24 hr tablet Commonly known as: GLUCOPHAGE-XR Take 1,000 mg by mouth in the morning and at bedtime.   mineral oil liquid Take 15 mLs by mouth daily as needed for moderate constipation.   multivitamin with minerals tablet Take 1 tablet by mouth daily. Centrum men 50+   Omega-3 1000 MG Caps Take 1,000 mg by mouth daily.   Ozempic (1 MG/DOSE) 4 MG/3ML Sopn Generic drug: Semaglutide (1 MG/DOSE) Inject 1 mg into the skin once a week.   pantoprazole 40 MG tablet Commonly known as: Protonix Take 1 tablet (40 mg total) by mouth 2 (two) times daily.   STOOL  SOFTENER PO Take 200 mg by mouth daily.   torsemide 20 MG tablet Commonly known as: DEMADEX Take 1 tablet (20 mg total) by mouth 2 (two) times daily.   valACYclovir 1000 MG tablet Commonly known as: VALTREX Take 1,000 mg by mouth at bedtime.        Follow-up Information     Lucas Evens, MD. Schedule an appointment as soon as possible for a visit in 1 week(s).   Specialty: Family Medicine Contact information: [place] [address] [phone]         Satira Sark, MD. Go to.   Specialty: Cardiology Contact information: [place] [address] [phone]         [place]. Go to.   Contact information: [place] [address] [phone]               Allergies  Allergen Reactions   Azithromycin Nausea Only   Tape Rash and Other (See Comments)    Causes  skin redness, Use paper tape only.    Consultations: GI   Procedures/Studies: CT ABDOMEN PELVIS W CONTRAST  Result Date: 05/28/2022 CLINICAL DATA:  Anemia. Intermittent diarrhea and constipation. Abdominal pain. EXAM: CT ABDOMEN AND PELVIS WITH CONTRAST TECHNIQUE: Multidetector CT imaging of the abdomen and pelvis was performed using the standard protocol following bolus administration of intravenous contrast. RADIATION DOSE REDUCTION: This exam was performed according to the departmental dose-optimization program which includes automated exposure control, adjustment of the mA and/or kV according to patient size and/or use of iterative reconstruction technique. CONTRAST:  156m OMNIPAQUE IOHEXOL 300 MG/ML  SOLN COMPARISON:  Report from noncontrast abdominal CT from 06/18/2012, and cardiac CT from 05/31/2021 FINDINGS: Lower chest: Prominence of fatty tissues in the anterior mediastinum/pericardial space, potentially mediastinal lipomatosis or a lipoma in the pericardial adipose tissues. Mild  cardiomegaly. Small right pleural effusion. Patchy ground-glass and airspace opacity in the right lower lobe suspicious for pneumonia. Mild mosaic attenuation at the lung bases and mild atelectasis along the right hemidiaphragm in the right middle lobe. Coronary artery and descending thoracic aortic atherosclerosis. Suspected subcarinal adenopathy, 2.1 cm in short axis on image 1 series 3. Hepatobiliary: Cholecystectomy.  Otherwise unremarkable. Pancreas: Unremarkable Spleen: Unremarkable Adrenals/Urinary Tract: Both adrenal glands appear normal. 1.1 cm in diameter Bosniak category 1 exophytic cyst from the right kidney upper pole. 1.8 by 1.3 cm Bosniak category 1 cyst of the right kidney lower pole. 1.7 by 0.9 cm Bosniak category 1 cyst of the left mid kidney. None of these require further imaging follow up. 0.8 cm left kidney lower pole nonobstructive renal calculus. No other urinary tract calculi are observed. No hydronephrosis or hydroureter. Stomach/Bowel: Unremarkable Vascular/Lymphatic: Atherosclerosis is present, including aortoiliac atherosclerotic disease. Reproductive: Prostatectomy. Mild fatty prominence along the right spermatic cord. Other: No supplemental non-categorized findings. Musculoskeletal: Ventral hernia mesh noted. Subacute healing fractures of the right lateral seventh, eighth, ninth, and tenth ribs. Additional older healed rib fractures are noted. 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 Loss of intervertebral disc height and Schmorl's nodes noted at all levels between L2 and L5. IMPRESSION: 1. Airspace opacity in the right lower lobe suspicious for pneumonia, with mild mosaic attenuation at the lung bases. Small right pleural effusion. 2. Early healing response of fractures of the right lateral seventh, eighth,  ninth, and tenth ribs. 3. Stable subcarinal adenopathy at 2.1 cm in short axis, cause uncertain. 4. Other imaging findings of potential clinical significance: Mediastinal lipomatosis. Mild cardiomegaly. Aortic Atherosclerosis (ICD10-I70.0). Coronary atherosclerosis. Nonobstructive left nephrolithiasis. Prostatectomy. Ventral hernia mesh. Lumbar impingement at L3-4, L4-5, and L5-S1. Electronically Signed   By: WVan ClinesM.D.   On: 05/28/2022 18:26   DG CHEST PORT 1 VIEW  Result Date: 05/25/2022 CLINICAL DATA:  History. EXAM: PORTABLE CHEST 1 VIEW COMPARISON:  Prior chest radiographs 05/21/2022 and earlier. FINDINGS: Enlarged appearance of the cardiopericardial silhouette. Aortic atherosclerosis. Chronic elevation of the right hemidiaphragm. Streaky and ill-defined opacities within the right mid and lower lung field, progressed from the prior examination. Persistent small right pleural effusion. No evidence of pneumothorax. Known subacute, displaced fractures of the right lateral ninth and tenth ribs. Soft tissue anchors within the proximal right humerus. IMPRESSION: Enlarged appearance of the cardiopericardial silhouette, which may reflect cardiomegaly and/or the presence of a pericardial effusion. Streaky and ill-defined opacities within the right mid and lower lung field,  progressed. Findings may reflect atelectasis or pneumonia. Small right pleural effusion. Chronic elevation of the right hemidiaphragm. Aortic Atherosclerosis (ICD10-I70.0). Electronically Signed   By: Kellie Simmering D.O.   On: 05/25/2022 08:33   DG Chest 2 View  Result Date: 05/21/2022 CLINICAL DATA:  Shortness of breath EXAM: CHEST - 2 VIEW COMPARISON:  03/13/2022 FINDINGS: Globular enlargement of the cardiopericardial silhouette, similar in appearance to prior. Aortic atherosclerosis. Chronic elevation of the right hemidiaphragm with streaky right basilar opacity. Small right pleural effusion. No pneumothorax. Minimally displaced  subacute right ninth and tenth rib fractures. IMPRESSION: 1. Small right pleural effusion with streaky right basilar opacity, which may represent atelectasis versus pneumonia. 2. Globular enlargement of the cardiopericardial silhouette, similar to prior. Underlying pericardial effusion not excluded. Electronically Signed   By: Davina Poke D.O.   On: 05/21/2022 15:14   Intravitreal Injection, Pharmacologic Agent - OS - Left Eye  Result Date: 05/06/2022 Time Out 05/04/2022. 3:00 PM. Confirmed correct patient, procedure, site, and patient consented. Anesthesia Topical anesthesia was used. Anesthetic medications included Lidocaine 2%, Proparacaine 0.5%. Procedure Preparation included 5% betadine to ocular surface, eyelid speculum. A (32g) needle was used. Injection: 6 mg faricimab-svoa 6 MG/0.05ML   Route: Intravitreal, Site: Left Eye   NDC: [id number], Lot: [id number], Expiration date: 05/15/2023, Waste: 0 mL Post-op Post injection exam found visual acuity of at least counting fingers. The patient tolerated the procedure well. There were no complications. The patient received written and verbal post procedure care education. Post injection medications were not given.   OCT, Retina - OU - Both Eyes  Result Date: 05/06/2022 Right Eye Quality was good. Central Foveal Thickness: 360. Progression has worsened. Findings include no SRF, abnormal foveal contour, intraretinal fluid, vitreomacular adhesion (Mild interval improvement in IRF superior and temporal fovea and macula ). Left Eye Quality was good. Central Foveal Thickness: 307. Progression has been stable. Findings include abnormal foveal contour, intraretinal hyper-reflective material, intraretinal fluid (Persistent IRF/cystic changes; partial PVD). 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 CME - cystoid macular edema. PED - pigment epithelial detachment. IRF - intraretinal fluid. SRF - subretinal fluid. EZ - ellipsoid zone. ERM - epiretinal membrane. ORA - outer retinal atrophy. ORT - outer retinal tubulation. SRHM - subretinal hyper-reflective material. IRHM - intraretinal hyper-reflective material    Discharge Exam: Vitals:   06/01/22 0546 06/01/22 0800  BP: (!) 135/52   Pulse: (!) 59 61  Resp: 20   Temp: 97.8 F (36.6 C)   SpO2: 98%    Vitals:   05/31/22 1715 05/31/22 2102 06/01/22 0546 06/01/22 0800  BP:  (!) 135/51 (!) 135/52   Pulse: (!) 57 (!) 52 (!) 59 61  Resp:  20 20   Temp:  98.6 F (37 C) 97.8 F (36.6 C)   TempSrc:  Oral Oral   SpO2:  91% 98%   Weight:   104.1 kg   Height:   '5\' 10"'$  (1.778 m)     General: Pt is alert, awake, not in acute distress Cardiovascular: RRR, S1/S2 +, no rubs, no gallops Respiratory: CTA bilaterally, no wheezing, no rhonchi Abdominal: Soft, NT, ND, bowel sounds + Extremities: no edema, no cyanosis    The results of significant diagnostics from this hospitalization (including imaging, microbiology, ancillary and laboratory) are  listed below for reference.     Microbiology: No results found for this or any previous visit (from the past 240 hour(s)).   Labs: BNP (last 3 results) Recent Labs    08/09/21 1753 05/21/22 1222  BNP 240.0* 580.9*   Basic Metabolic Panel: Recent Labs  Lab 05/28/22 0534 05/29/22 0423 05/30/22 0428 05/31/22 0357 06/01/22 0433  NA 136 135 136 136 135  K 4.4 3.7 3.4* 3.4* 3.6  CL 100 98 92* 87* 90*  CO2 30 29 33* 38* 34*  GLUCOSE 217* 189* 197* 147* 191*  BUN '19 20 22 18 22  '$ CREATININE 1.04 1.02 1.13 1.20 1.36*  CALCIUM 8.6* 8.5* 8.9 8.8* 8.8*  MG 1.5* 1.5* 1.6* 1.7 1.6*   Liver Function Tests: Recent Labs  Lab 05/29/22 1221  BILITOT 0.6   No results for input(s): "LIPASE", "AMYLASE" in the last 168  hours. No results for input(s): "AMMONIA" in the last 168 hours. CBC: Recent Labs  Lab 05/28/22 0534 05/28/22 1638 05/29/22 0423 05/29/22 1221 05/30/22 0428 05/31/22 0357 06/01/22 0433  WBC 11.0*  --  9.4  --  8.7 10.4 7.8  NEUTROABS  --   --   --  7.5  --   --   --   HGB 7.4*   < > 8.0* 8.7* 8.5* 9.2* 9.2*  HCT 25.2*   < > 26.2* 28.4* 27.7* 30.5* 30.6*  MCV 84.3  --  83.4  --  83.2 84.0 83.4  PLT 336  --  320  --  341 392 382   < > = values in this interval not displayed.   Cardiac Enzymes: No results for input(s): "CKTOTAL", "CKMB", "CKMBINDEX", "TROPONINI" in the last 168 hours. BNP: Invalid input(s): "POCBNP" CBG: Recent Labs  Lab 05/31/22 0707 05/31/22 1103 05/31/22 1614 05/31/22 2103 06/01/22 0705  GLUCAP 184* 243* 214* 194* 204*   D-Dimer No results for input(s): "DDIMER" in the last 72 hours. Hgb A1c No results for input(s): "HGBA1C" in the last 72 hours. Lipid Profile No results for input(s): "CHOL", "HDL", "LDLCALC", "TRIG", "CHOLHDL", "LDLDIRECT" in the last 72 hours. Thyroid function studies No results for input(s): "TSH", "T4TOTAL", "T3FREE", "THYROIDAB" in the last 72 hours.  Invalid input(s): "FREET3" Anemia work up Recent Labs    05/30/22 0428  VITAMINB12 624  FOLATE 13.5  FERRITIN 42  TIBC 344  IRON 18*   Urinalysis    Component Value Date/Time   COLORURINE YELLOW 10/18/2018 1525   APPEARANCEUR CLEAR 10/18/2018 1525   LABSPEC 1.012 10/18/2018 1525   PHURINE 5.0 10/18/2018 1525   GLUCOSEU 50 (A) 10/18/2018 1525   HGBUR MODERATE (A) 10/18/2018 1525   BILIRUBINUR NEGATIVE 10/18/2018 [place] 10/18/2018 1525   PROTEINUR 100 (A) 10/18/2018 1525   NITRITE NEGATIVE 10/18/2018 [place] 10/18/2018 1525   Sepsis Labs Recent Labs  Lab 05/29/22 0423 05/30/22 0428 05/31/22 0357 06/01/22 0433  WBC 9.4 8.7 10.4 7.8   Microbiology No results found for this or any previous visit (from the past 240  hour(s)).   Time coordinating discharge: 35 minutes  SIGNED:   Rodena Goldmann, DO [facility] 06/01/2022, 9:20 AM  If 7PM-7AM, please contact night-coverage [email or web address]

## 2022-06-01 NOTE — Telephone Encounter (Signed)
Pt daughter called stating that pt was seen in hospital and told to f/u with Dr. [name] in a week. Informed her that is booked until the following year. She states they do not want to see APP. Please advise.

## 2022-06-01 NOTE — Progress Notes (Signed)
Patient slept the whole shift only waking to have vitals taken. No complaints of pain.

## 2022-06-01 NOTE — Telephone Encounter (Signed)
Scheduled to see [name] on 06/04/2022 '@3'$ :00 pm Patient informed and verbalized understanding of plan.

## 2022-06-01 NOTE — Progress Notes (Signed)
Nsg Discharge Note  Admit Date:  05/21/2022 Discharge date: 06/01/2022   [name] to be D/C'd Home per MD order.  AVS completed.  Copy for chart, and copy for patient signed, and dated. Patient/caregiver able to verbalize understanding.  Discharge Medication: Allergies as of 06/01/2022       Reactions   Azithromycin Nausea Only   Tape Rash, Other (See Comments)   Causes skin redness, Use paper tape only.        Medication List     STOP taking these medications    amLODipine 10 MG tablet Commonly known as: NORVASC   Eliquis 5 MG Tabs tablet Generic drug: apixaban   furosemide 40 MG tablet Commonly known as: LASIX   lisinopril 40 MG tablet Commonly known as: ZESTRIL   VITAMIN D-3 PO       TAKE these medications    albuterol 108 (90 Base) MCG/ACT inhaler Commonly known as: VENTOLIN HFA Inhale 2 puffs into the lungs every 4 (four) hours as needed for wheezing or shortness of breath.   amiodarone 200 MG tablet Commonly known as: PACERONE TAKE 1 TABLET BY MOUTH EVERY DAY   Basaglar KwikPen 100 UNIT/ML Inject 35 Units into the skin at bedtime. What changed: how much to take   carvedilol 6.25 MG tablet Commonly known as: COREG Takes 1 tablet by mouth in the AM and 1 tablet by mouth in the PM   Combigan 0.2-0.5 % ophthalmic solution Generic drug: brimonidine-timolol Apply 1 drop to eye 2 (two) times daily.   CRANBERRY PO Take 1 tablet by mouth daily.   diphenhydramine-acetaminophen 25-500 MG Tabs tablet Commonly known as: TYLENOL PM Take 2 tablets by mouth at bedtime.   docusate sodium 100 MG capsule Commonly known as: Colace Take 1 capsule (100 mg total) by mouth daily.   ferrous sulfate 325 (65 FE) MG tablet Take 1 tablet (325 mg total) by mouth daily with breakfast. Start taking on: June 02, 2022   gemfibrozil 600 MG tablet Commonly known as: LOPID Take 600 mg by mouth at bedtime.   glimepiride 2 MG tablet Commonly known as: AMARYL Take  2 mg by mouth daily with lunch.   Klor-Con M20 20 MEQ tablet Generic drug: potassium chloride SA TAKE 1 TABLET BY MOUTH EVERY DAY   Melatonin 10 MG Tabs Take 10 mg by mouth at bedtime.   metFORMIN 500 MG 24 hr tablet Commonly known as: GLUCOPHAGE-XR Take 1,000 mg by mouth in the morning and at bedtime.   mineral oil liquid Take 15 mLs by mouth daily as needed for moderate constipation.   multivitamin with minerals tablet Take 1 tablet by mouth daily. Centrum men 50+   Omega-3 1000 MG Caps Take 1,000 mg by mouth daily.   Ozempic (1 MG/DOSE) 4 MG/3ML Sopn Generic drug: Semaglutide (1 MG/DOSE) Inject 1 mg into the skin once a week.   pantoprazole 40 MG tablet Commonly known as: Protonix Take 1 tablet (40 mg total) by mouth 2 (two) times daily.   STOOL SOFTENER PO Take 200 mg by mouth daily.   torsemide 20 MG tablet Commonly known as: DEMADEX Take 1 tablet (20 mg total) by mouth 2 (two) times daily.   valACYclovir 1000 MG tablet Commonly known as: VALTREX Take 1,000 mg by mouth at bedtime.        Discharge Assessment: Vitals:   06/01/22 0546 06/01/22 0800  BP: (!) 135/52   Pulse: (!) 59 61  Resp: 20   Temp: 97.8 F (  36.6 C)   SpO2: 98%    Skin clean, dry and intact without evidence of skin break down, no evidence of skin tears noted. IV catheter discontinued intact. Site without signs and symptoms of complications - no redness or edema noted at insertion site, patient denies c/o pain - only slight tenderness at site.  Dressing with slight pressure applied.  D/c Instructions-Education: Discharge instructions given to patient/family with verbalized understanding. D/c education completed with patient/family including follow up instructions, medication list, d/c activities limitations if indicated, with other d/c instructions as indicated by MD - patient able to verbalize understanding, all questions fully answered. Patient instructed to return to ED, call 911, or  call MD for any changes in condition.  Patient escorted via [place], and D/C home via private auto.  Clovis Fredrickson, LPN 7/94/4461 90:12 AM

## 2022-06-04 ENCOUNTER — Ambulatory Visit: Payer: Medicare Other | Admitting: Cardiology

## 2022-06-04 ENCOUNTER — Encounter: Payer: Self-pay | Admitting: Cardiology

## 2022-06-04 ENCOUNTER — Encounter (HOSPITAL_COMMUNITY): Payer: Self-pay | Admitting: Internal Medicine

## 2022-06-04 VITALS — BP 115/58 | HR 52 | Ht 70.0 in | Wt 235.6 lb

## 2022-06-04 DIAGNOSIS — I25119 Atherosclerotic heart disease of native coronary artery with unspecified angina pectoris: Secondary | ICD-10-CM | POA: Diagnosis not present

## 2022-06-04 DIAGNOSIS — Z8719 Personal history of other diseases of the digestive system: Secondary | ICD-10-CM | POA: Diagnosis not present

## 2022-06-04 DIAGNOSIS — R0602 Shortness of breath: Secondary | ICD-10-CM

## 2022-06-04 DIAGNOSIS — I480 Paroxysmal atrial fibrillation: Secondary | ICD-10-CM | POA: Diagnosis not present

## 2022-06-04 MED ORDER — LISINOPRIL 20 MG PO TABS: 20.0000 mg | ORAL_TABLET | Freq: Every day | ORAL | 1 refills | Status: DC

## 2022-06-04 MED ORDER — CARVEDILOL 3.125 MG PO TABS
3.1250 mg | ORAL_TABLET | Freq: Two times a day (BID) | ORAL | 1 refills | Status: DC
Start: 2022-06-04 — End: 2022-07-24

## 2022-06-04 NOTE — Progress Notes (Signed)
Cardiology Office Note  Date: 06/04/2022   ID: Abdo, Denault October 22, 1940, MRN [id number]  PCP:  [name], MD  Cardiologist:  Rozann Lesches, MD Electrophysiologist:  Thompson Grayer, MD   Chief Complaint  Patient presents with   Cardiac follow-up    History of Present Illness: Lucas Dudley is a 81 y.o. male seen recently in the atrial fibrillation clinic by Ms. Kayleen Memos NP on August 7, I reviewed the note.  He was sent for chest x-ray and follow-up lab work at that time in the setting of shortness of breath.  He was found to be anemic with hemoglobin 7.6 down from 13.1 and was ultimately admitted for further evaluation.  He was seen by GI with EGD and colonoscopy not showing any acute bleeding source.  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  Eliquis was held and not resumed at discharge.  Last hemoglobin was up to 9.2.  He has scheduled follow-up with GI.  He is here today with his daughter.  Reports NYHA class II-III dyspnea on exertion, no palpitations or chest pain.  Also lightheaded at times but no syncope.  I reviewed his discharge summary part.  He was to hold Norvasc and lisinopril due to low blood pressures.  In talking with him today, he has actually been taking lisinopril 40 mg daily but just cut it back to 20 mg today.  His blood pressure is low normal range.  Also heart rate in the low 50s and sinus bradycardia by ECG.  We went over his medications in detail.  CHA2DS2-VASc score is 5.  Plan is for him to stay off Eliquis for now.  I did talk with him about referral to our structural heart team to see if he is a candidate for Watchman device.  I do think he could be on anticoagulation temporarily in that case with close follow-up.  He also needs an updated echocardiogram to ensure no change in LVEF since October 2022.  Past Medical History:  Diagnosis Date   Arthritis    Atrial fibrillation (HCC)     CAD (coronary artery disease)    a. Cath 03/17/15 showing 100% ostial D1, 50% prox LAD to mid LAD, 40% RPDA stenosis. Med rx. // Myoview 01/2020: EF 31 diffuse perfusion defect without reversibility (suspect artifact); reviewed with Dr. Aggie Cosier study felt to be low risk   Cataract    Mixed form OD   Chronic diastolic CHF (congestive heart failure) (HCC)    Diabetic retinopathy ([place])    NPDR OU   Essential hypertension    Glaucoma    POAG OU   History of gout    Hyperlipidemia    Hypertensive retinopathy    OU   Kidney stones    Melanoma of neck (HCC)    NICM (nonischemic cardiomyopathy) ([place])    OSA on CPAP 2012   Prostate cancer ([place])    Type II diabetes mellitus ([place])     Past Surgical History:  Procedure Laterality Date   ABDOMINAL HERNIA REPAIR     w/mesh   ATRIAL FIBRILLATION ABLATION N/A 06/06/2021   Procedure: ATRIAL FIBRILLATION ABLATION;  Surgeon: Thompson Grayer, MD;  Location: [facility];  Service: Cardiovascular;  Laterality: N/A;   BIOPSY  05/24/2022   Procedure: BIOPSY;  Surgeon: Daneil Dolin, MD;  [facility];  Service: Endoscopy;;   BIOPSY  05/30/2022   Procedure: BIOPSY;  Surgeon: Daneil Dolin, MD;  [facility];  Service: Endoscopy;;   CARDIAC CATHETERIZATION N/A 03/17/2015   Procedure: Left Heart Cath and Coronary Angiography;  Surgeon: Lorretta Harp, MD;  Location: [facility];  Service: Cardiovascular;  Laterality: N/A;   CARDIOVERSION N/A 08/09/2021   Procedure: CARDIOVERSION;  Surgeon: [name], MD;  Location: [facility];  Service: Cardiovascular;  Laterality: N/A;   CARDIOVERSION N/A 11/10/2021   Procedure: CARDIOVERSION;  Surgeon: Berniece Salines, DO;  Location: [facility];  Service: Cardiovascular;  Laterality: N/A;   carotid doppler  09/17/2008   rigt and left ICAs 0-49%;mildly  abnormal   CATARACT EXTRACTION Left 2020   Dr. Kathlen Mody   COLONOSCOPY N/A 05/16/2017   Procedure: COLONOSCOPY;   Surgeon: Rogene Houston, MD;  [facility];  Service: Endoscopy;  Laterality: N/A;  930   COLONOSCOPY WITH PROPOFOL N/A 05/24/2022   Procedure: COLONOSCOPY WITH PROPOFOL;  Surgeon: Daneil Dolin, MD;  [facility];  Service: Endoscopy;  Laterality: N/A;   DOPPLER ECHOCARDIOGRAPHY  05/25/2009   EF 50-55%,LA mildly dilated, LV function normal   ELECTROPHYSIOLOGIC STUDY N/A 04/05/2015   Procedure: Cardioversion;  Surgeon: Sanda Klein, MD;  Location: [facility];  Service: Cardiovascular;  Laterality: N/A;   ELECTROPHYSIOLOGIC STUDY N/A 09/06/2015   Procedure: Atrial Fibrillation Ablation;  Surgeon: Thompson Grayer, MD;  Location: [facility];  Service: Cardiovascular;  Laterality: N/A;   ELECTROPHYSIOLOGIC STUDY N/A 07/12/2016   redo afib ablation by Dr Rayann Heman   ENTEROSCOPY N/A 05/30/2022   Procedure: ENTEROSCOPY;  Surgeon: Daneil Dolin, MD;  [facility];  Service: Endoscopy;  Laterality: N/A;   ESOPHAGOGASTRODUODENOSCOPY (EGD) WITH PROPOFOL N/A 05/24/2022   Procedure: ESOPHAGOGASTRODUODENOSCOPY (EGD) WITH PROPOFOL;  Surgeon: Daneil Dolin, MD;  [facility];  Service: Endoscopy;  Laterality: N/A;   EXCISIONAL HEMORRHOIDECTOMY     "inside and out"   EYE SURGERY Left 2020   Cat Sx - Dr. Kathlen Mody   FINE NEEDLE ASPIRATION Right    knee; "drew ~ 1 quart off"   GIVENS CAPSULE STUDY N/A 05/25/2022   Procedure: GIVENS CAPSULE STUDY;  Surgeon: Eloise Harman, DO;  [facility];  Service: Endoscopy;  Laterality: N/A;   HERNIA REPAIR     LAPAROSCOPIC CHOLECYSTECTOMY     MELANOMA EXCISION Right    "neck"   NM MYOCAR PERF WALL MOTION  02/21/2012   EF 61% ,EXERCISE 7 METS. exercise stopped due to wheezing and shortness of breathe   POLYPECTOMY  05/16/2017   Procedure: POLYPECTOMY;  Surgeon: Rogene Houston, MD;  [facility];  Service: Endoscopy;;  colon   POLYPECTOMY  05/24/2022   Procedure: POLYPECTOMY;  Surgeon:  Daneil Dolin, MD;  [facility];  Service: Endoscopy;;   PROSTATECTOMY     SHOULDER OPEN ROTATOR CUFF REPAIR Right X 2   SUBMUCOSAL TATTOO INJECTION  05/30/2022   Procedure: SUBMUCOSAL TATTOO INJECTION;  Surgeon: Daneil Dolin, MD;  [facility];  Service: Endoscopy;;   TEE WITHOUT CARDIOVERSION N/A 09/05/2015   Procedure: TRANSESOPHAGEAL ECHOCARDIOGRAM (TEE);  Surgeon: Sueanne Margarita, MD;  Location: [facility];  Service: Cardiovascular;  Laterality: N/A;    Current Outpatient Medications  Medication Sig Dispense Refill   albuterol (VENTOLIN HFA) 108 (90 Base) MCG/ACT inhaler Inhale 2 puffs into the lungs every 4 (four) hours as needed for wheezing or shortness of breath.     amiodarone (  PACERONE) 200 MG tablet TAKE 1 TABLET BY MOUTH EVERY DAY 90 tablet 1   carvedilol (COREG) 3.125 MG tablet Take 1 tablet (3.125 mg total) by mouth 2 (two) times daily. 180 tablet 1   COMBIGAN 0.2-0.5 % ophthalmic solution Apply 1 drop to eye 2 (two) times daily.     CRANBERRY PO Take 1 tablet by mouth daily.     diphenhydramine-acetaminophen (TYLENOL PM) 25-500 MG TABS tablet Take 2 tablets by mouth at bedtime.     docusate sodium (COLACE) 100 MG capsule Take 1 capsule (100 mg total) by mouth daily. 60 capsule 2   ferrous sulfate 325 (65 FE) MG tablet Take 1 tablet (325 mg total) by mouth daily with breakfast. 60 tablet 0   gemfibrozil (LOPID) 600 MG tablet Take 600 mg by mouth at bedtime.      glimepiride (AMARYL) 2 MG tablet Take 2 mg by mouth at bedtime.     Insulin Glargine (BASAGLAR KWIKPEN) 100 UNIT/ML Inject 35 Units into the skin at bedtime. (Patient taking differently: Inject 30 Units into the skin at bedtime.) 40 mL    KLOR-CON M20 20 MEQ tablet TAKE 1 TABLET BY MOUTH EVERY DAY 90 tablet 1   lisinopril (ZESTRIL) 20 MG tablet Take 1 tablet (20 mg total) by mouth daily. 90 tablet 1   Melatonin 10 MG TABS Take 10 mg by mouth at bedtime.     metFORMIN (GLUCOPHAGE-XR) 500 MG  24 hr tablet Take 1,000 mg by mouth in the morning and at bedtime.     mineral oil liquid Take 15 mLs by mouth daily as needed for moderate constipation.     Multiple Vitamins-Minerals (MULTIVITAMIN WITH MINERALS) tablet Take 1 tablet by mouth daily. Centrum men 50+     Omega-3 1000 MG CAPS Take 1,000 mg by mouth daily.     OZEMPIC, 1 MG/DOSE, 4 MG/3ML SOPN Inject 1 mg into the skin once a week.     pantoprazole (PROTONIX) 40 MG tablet Take 1 tablet (40 mg total) by mouth 2 (two) times daily. 60 tablet 0   torsemide (DEMADEX) 20 MG tablet Take 1 tablet (20 mg total) by mouth 2 (two) times daily. 30 tablet 2   valACYclovir (VALTREX) 1000 MG tablet Take 1,000 mg by mouth at bedtime.     No current facility-administered medications for this visit.   Allergies:  Azithromycin and Tape   Social History: The patient  reports that he quit smoking about 38 years ago. His smoking use included cigarettes. He has a 54.00 pack-year smoking history. He has never used smokeless tobacco. He reports that he does not drink alcohol and does not use drugs.   Family History: The patient's family history includes Colon cancer in his maternal aunt; Diabetes in his father; Healthy in his daughter; Heart attack (age of onset: 65) in his mother; Heart disease in his father and mother; Lung cancer in his mother; Stroke in his brother.   ROS: No frank syncope.  Hearing loss.  Physical Exam: VS:  BP (!) 115/58   Pulse (!) 52   Ht '5\' 10"'$  (1.778 m)   Wt 235 lb 9.6 oz (106.9 kg)   SpO2 95%   BMI 33.81 kg/m , BMI Body mass index is 33.81 kg/m.  Wt Readings from Last 3 Encounters:  06/04/22 235 lb 9.6 oz (106.9 kg)  06/01/22 229 lb 9.6 oz (104.1 kg)  05/21/22 247 lb 9.6 oz (112.3 kg)    General: Patient appears comfortable at rest.  HEENT: Conjunctiva and lids normal. Neck: Supple, no elevated JVP or carotid bruits, no thyromegaly. Lungs: Decreased breath sounds at the bases, nonlabored breathing at  rest. Cardiac: Regular rate and rhythm, no S3, 1.6 systolic murmur, no pericardial rub. Abdomen: Soft, bowel sounds present. Extremities: Mild ankle edema, distal pulses 2+. Skin: Warm and dry. Musculoskeletal: No kyphosis. Neuropsychiatric: Alert and oriented x3, affect grossly appropriate.  ECG:  An ECG dated 05/21/2022 was personally reviewed today and demonstrated:  Sinus rhythm with prolonged PR interval, left anterior fascicular block, poor R wave progression rule out old anterior infarct pattern.  Recent Labwork: 05/21/2022: ALT 14; AST 17; B Natriuretic Peptide 166.2; TSH 3.037 06/01/2022: BUN 22; Creatinine, Ser 1.36; Hemoglobin 9.2; Magnesium 1.6; Platelets 382; Potassium 3.6; Sodium 135   Other Studies Reviewed Today:  Echocardiogram 08/10/2021:  1. Left ventricular ejection fraction, by estimation, is 60 to 65%. The  left ventricle has normal function. Left ventricular endocardial border  not optimally defined to evaluate regional wall motion. There is mild left  ventricular hypertrophy. Left  ventricular diastolic parameters are indeterminate.   2. Right ventricular systolic function is normal. The right ventricular  size is normal. Tricuspid regurgitation signal is inadequate for assessing  PA pressure.   3. The mitral valve is abnormal. Mild mitral valve regurgitation. No  evidence of mitral stenosis.   4. The aortic valve was not well visualized. There is mild calcification  of the aortic valve. There is mild thickening of the aortic valve. Aortic  valve regurgitation is not visualized. No aortic stenosis is present.   Assessment and Plan:  [place] Referral for Left Atrial Appendage Closure with Non-Valvular Atrial Fibrillation   KENYEN CANDY is a 81 y.o. male is being referred to the [facility] Team for evaluation for Left Atrial Appendage Closure with Watchman device for the management of stroke risk resulting form non-valvular  atrial fibrillation.    Base upon Mr. 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  The patient has a HAS-BLED score of 4 indicating a Yearly Major Bleeding Risk of 8.7%.   His CHADS2-VASc Score is 5 with an unadjusted Ischemic Stroke Rate (% per year) of 7.2%.   His stroke risk necessitates a strategy of stroke prevention with either long-term oral anticoagulation or left atrial appendage occlusion therapy. We have discussed their bleeding risk in the context of their comorbid medical problems, as well as the rationale for referral for evaluation of Watchman left atrial appendage occlusion therapy. While the patient is at high long-term bleeding risk, they may be appropriate for short-term anticoagulation. Based on this individual patient's stroke and bleeding risk, a shared decision has been made to refer the patient for consideration of Watchman left atrial appendage closure utilizing the [employer] of Cardiology shared decision tool.   1.  Persistent atrial fibrillation with CHA2DS2-VASc score of 5.  He is in sinus bradycardia today by ECG on low-dose amiodarone.  Plan to cut Coreg back to 3.125 mg twice daily with heart rate in the low 50s at this time.  He is off Eliquis per above discussion with concern for recurrent GI bleeding risk being fairly high in the setting of multiple small bowel AVMs by recent work-up.  We will refer him for Watchman device evaluation candidacy.  2.  Essential hypertension, blood pressure low normal at this point.  Agree with discontinuation of Norvasc, he did inadvertently resume lisinopril 40 mg daily which will be cut back to 20 mg daily.  May need to reduce or discontinue altogether depending on how he does.  3.  CAD with history of moderate LAD disease and branch vessel disease being  managed medically.  He is on low-dose beta-blocker and Lopid at this point.  We will get an echocardiogram to ensure no change in LVEF.  Medication Adjustments/Labs and Tests Ordered: Current medicines are reviewed at length with the patient today.  Concerns regarding medicines are outlined above.   Tests Ordered: Orders Placed This Encounter  Procedures   Ambulatory referral to Cardiac Electrophysiology   EKG 12-Lead   ECHOCARDIOGRAM COMPLETE    Medication Changes: Meds ordered this encounter  Medications   lisinopril (ZESTRIL) 20 MG tablet    Sig: Take 1 tablet (20 mg total) by mouth daily.    Dispense:  90 tablet    Refill:  1   carvedilol (COREG) 3.125 MG tablet    Sig: Take 1 tablet (3.125 mg total) by mouth 2 (two) times daily.    Dispense:  180 tablet    Refill:  1    06/04/2022 dose decrease    Disposition:  Follow up  6 to 8 weeks.  Signed, Satira Sark, MD, [facility] 06/04/2022 3:39 PM    [facility] [place], [place], [place] [address] Phone: [phone]; Fax: [phone]

## 2022-06-04 NOTE — Patient Instructions (Addendum)
Medication Instructions:  Your physician has recommended you make the following change in your medication:  Change lisinopril to 20 mg daily Decrease carvedilol to 3.125 mg twice daily Continue other medications the same  Labwork: none  Testing/Procedures: Your physician has requested that you have an echocardiogram. Echocardiography is a painless test that uses sound waves to create images of your heart. It provides your doctor with information about the size and shape of your heart and how well your heart's chambers and valves are working. This procedure takes approximately one hour. There are no restrictions for this procedure.  Follow-Up: Your physician recommends that you schedule a follow-up appointment in: 6-8 weeks  Any Other Special Instructions Will Be Listed Below (If Applicable). You have been referred to EP for a Watchman evaluation   If you need a refill on your cardiac medications before your next appointment, please call your pharmacy.

## 2022-06-06 ENCOUNTER — Ambulatory Visit (INDEPENDENT_AMBULATORY_CARE_PROVIDER_SITE_OTHER): Payer: Medicare Other

## 2022-06-06 DIAGNOSIS — R0602 Shortness of breath: Secondary | ICD-10-CM | POA: Diagnosis not present

## 2022-06-06 LAB — ECHOCARDIOGRAM COMPLETE
AR max vel: 1.97 cm2
AV Peak grad: 10.6 mmHg
Ao pk vel: 1.63 m/s
Area-P 1/2: 3.89 cm2
Calc EF: 74.5 %
MV M vel: 2.48 m/s
MV Peak grad: 24.5 mmHg
S' Lateral: 3.21 cm
Single Plane A2C EF: 71.2 %
Single Plane A4C EF: 77.6 %

## 2022-06-07 ENCOUNTER — Telehealth: Payer: Self-pay | Admitting: *Deleted

## 2022-06-07 ENCOUNTER — Encounter: Payer: Self-pay | Admitting: *Deleted

## 2022-06-07 NOTE — Telephone Encounter (Signed)
Patient informed and Copy sent to PCP Reports starting lasix 40 mg daily today based on most recent kidney functioning showing creatnine of 2.?, which was done by PCP on 06/04/22. Lab result request from PCP office. Says he will repeat lab work next week.

## 2022-06-07 NOTE — Telephone Encounter (Signed)
-----   Message from Satira Sark, MD sent at 06/06/2022  5:00 PM EDT ----- Results reviewed.  Follow-up echocardiogram shows normal LVEF at 60 to 65%.  Estimated RV systolic pressure is moderately elevated.  He is already on Demadex with potassium supplement.  Please make sure that he takes an additional 20 mg Demadex if his weight goes up 2 pounds in 24 hours or 5 pounds in a week.  He can take the higher dose for 2 to 3 days in a row depending on response and weight loss.

## 2022-06-08 ENCOUNTER — Encounter (INDEPENDENT_AMBULATORY_CARE_PROVIDER_SITE_OTHER): Payer: Self-pay | Admitting: Gastroenterology

## 2022-06-08 ENCOUNTER — Encounter (INDEPENDENT_AMBULATORY_CARE_PROVIDER_SITE_OTHER): Payer: Self-pay | Admitting: Ophthalmology

## 2022-06-08 ENCOUNTER — Ambulatory Visit (INDEPENDENT_AMBULATORY_CARE_PROVIDER_SITE_OTHER): Payer: Medicare Other | Admitting: Ophthalmology

## 2022-06-08 ENCOUNTER — Ambulatory Visit (INDEPENDENT_AMBULATORY_CARE_PROVIDER_SITE_OTHER): Payer: Medicare Other | Admitting: Gastroenterology

## 2022-06-08 VITALS — BP 118/54 | HR 55 | Temp 97.9°F | Ht 70.0 in | Wt 236.0 lb

## 2022-06-08 DIAGNOSIS — K5520 Angiodysplasia of colon without hemorrhage: Secondary | ICD-10-CM

## 2022-06-08 DIAGNOSIS — D500 Iron deficiency anemia secondary to blood loss (chronic): Secondary | ICD-10-CM | POA: Diagnosis not present

## 2022-06-08 DIAGNOSIS — I10 Essential (primary) hypertension: Secondary | ICD-10-CM | POA: Diagnosis not present

## 2022-06-08 DIAGNOSIS — E113213 Type 2 diabetes mellitus with mild nonproliferative diabetic retinopathy with macular edema, bilateral: Secondary | ICD-10-CM

## 2022-06-08 DIAGNOSIS — Z8619 Personal history of other infectious and parasitic diseases: Secondary | ICD-10-CM

## 2022-06-08 DIAGNOSIS — Z961 Presence of intraocular lens: Secondary | ICD-10-CM | POA: Diagnosis not present

## 2022-06-08 DIAGNOSIS — H18523 Epithelial (juvenile) corneal dystrophy, bilateral: Secondary | ICD-10-CM

## 2022-06-08 DIAGNOSIS — H35033 Hypertensive retinopathy, bilateral: Secondary | ICD-10-CM

## 2022-06-08 DIAGNOSIS — H401130 Primary open-angle glaucoma, bilateral, stage unspecified: Secondary | ICD-10-CM

## 2022-06-08 MED ORDER — FARICIMAB-SVOA 6 MG/0.05ML IZ SOLN
6.0000 mg | INTRAVITREAL | Status: AC | PRN
  Administered 2022-06-08: 6 mg via INTRAVITREAL

## 2022-06-08 NOTE — Patient Instructions (Addendum)
Please continue your iron pill daily Continue pantoprazole '40mg'$  twice a day We will recheck blood counts and iron levels today  I will discuss the previously discovered lesion in the small bowel with Dr. Jenetta Downer, biopsy was benign but I will make sure he does not feel any further evaluation is warranted, I will make you aware if we need to investigate this further  Please let me know if you develop rectal bleeding, black stools, worsening fatigue, shortness of breath, dizziness or have any episodes where you pass out.   For your constipation, Start taking Miralax 1 capful every day for one week. If bowel movements do not improve, increase to 1 capful every 12 hours. If after two weeks there is no improvement, increase to 1 capful every 8 hours   Follow up 3 months

## 2022-06-08 NOTE — Progress Notes (Signed)
Referring Provider: [name], MD Primary Care Physician:  [name], MD Primary GI Physician: Jenetta Downer  Chief Complaint  Patient presents with   Hospitalization Follow-up    Patient here today due to a hospital follow up from 05/21/2022. Patient says he was seen for Anemia and pneumonia.He says he had a Egd and a Tcs while in patient. Patient denies any blood seen but states his stools are dark,but always have been. He does take oral Fe daily. He does have some dizziness and fatigue.   HPI:   Lucas Dudley is a 81 y.o. male with past medical history of  atrial fibrillation on chronic anticoagulation, systolic CHF, hypertension, diabetes  Patient presenting today for hospital follow up   Admitted to [facility] on 05/21/22 with worsening fatigue and sob on exertion. Reported some dark stools but no clear cut melena. Denied BRBPR, abdominal pain, nausea or vomiting. hgb 7.8 on admission, 7.4 at lowest , stool heme positive. Received 2 units PRBCs during admission. B12, folate and ferritin normal, serum iron and iron sat 18 and 5%,respectively. 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 Colonoscopy unremarkable. 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 Capsule did not reach cecum. Small bowel Enteroscopy 05/30/22: duodenal AVMs appeared innocent s/p ablation. Small bowel nodule-junction distal duodenum/jejunum. Irregular with submucosal component. Suspicious in appearance. S/p biopsy and inking. Pathology with benign findings. patient started on PO iron, hgb 9.2 on discharge 06/01/22.  Present:  Patient states he is doing okay today. Still having fatigue and weakness. He is having some dizziness upon standing and some shortness of breath on exertion. He is taking iron pills daily. No rectal bleeding or melena. He denies abdominal pain,  nausea or vomiting. Has hx of constipation, feels it is worsened by previous hemorrhoid surgery. He takes stool softener, usually has a BM every other day with the need to strain when he goes. Reports he Cannot drink much water due to cardiac issues. States appetite is at baseline, 1-2 large meals per day. Weight is stable today.   Saw Dr. [name] with cardiology, he is being referred for watchman procedure with consult on 8/30. Remains off of his Eliquis. BP was low in office with cards so lisinopril was decreased to '20mg'$  daily and norvasc was stopped during hospitalization.    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 Status post ablation. Small bowel nodule -junction distal duodenum/jejunum. Irregular with a submucosal component. Suspicious in appearance. 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  No active bleeding.  Capsule did not reach cecum. Last Colonoscopy:05/24/22- One 5 mm polyp in the sigmoid colon-hyperplastic - Diverticulosis in the descending colon and at the splenic flexure. Redundant and elongated colon. - The examination was otherwise normal on direct and retroflexion views. No blood or clot in the lower GI tract. Given negative evaluation today, small bowel evaluation warranted. Last Endoscopy:05/24/22- Normal esophagus. - Small hiatal hernia. Friable gastric mucosa. Couple of tiny gastric erosions the significance of which is uncertain. Status post gastric biopsy - Normal duodenal bulb, second portion of the duodenum and third portion of the duodenum.  Recommendations:    Past Medical History:  Diagnosis Date   Arthritis    Atrial fibrillation (HCC)    CAD (coronary artery disease)    a. Cath 03/17/15  showing 100% ostial D1, 50% prox LAD to mid LAD, 40% RPDA stenosis. Med rx. // Myoview 01/2020: EF 31 diffuse perfusion defect  without reversibility (suspect artifact); reviewed with Dr. Aggie Cosier study felt to be low risk   Cataract    Mixed form OD   Chronic diastolic CHF (congestive heart failure) (HCC)    Diabetic retinopathy ([place])    NPDR OU   Essential hypertension    Glaucoma    POAG OU   History of gout    Hyperlipidemia    Hypertensive retinopathy    OU   Kidney stones    Melanoma of neck (HCC)    NICM (nonischemic cardiomyopathy) ([place])    OSA on CPAP 2012   Prostate cancer ([place])    Type II diabetes mellitus ([place])     Past Surgical History:  Procedure Laterality Date   ABDOMINAL HERNIA REPAIR     w/mesh   ATRIAL FIBRILLATION ABLATION N/A 06/06/2021   Procedure: ATRIAL FIBRILLATION ABLATION;  Surgeon: Thompson Grayer, MD;  Location: [facility];  Service: Cardiovascular;  Laterality: N/A;   BIOPSY  05/24/2022   Procedure: BIOPSY;  Surgeon: Daneil Dolin, MD;  [facility];  Service: Endoscopy;;   BIOPSY  05/30/2022   Procedure: BIOPSY;  Surgeon: Daneil Dolin, MD;  [facility];  Service: Endoscopy;;   CARDIAC CATHETERIZATION N/A 03/17/2015   Procedure: Left Heart Cath and Coronary Angiography;  Surgeon: Lorretta Harp, MD;  Location: [facility];  Service: Cardiovascular;  Laterality: N/A;   CARDIOVERSION N/A 08/09/2021   Procedure: CARDIOVERSION;  Surgeon: [name], MD;  Location: [facility];  Service: Cardiovascular;  Laterality: N/A;   CARDIOVERSION N/A 11/10/2021   Procedure: CARDIOVERSION;  Surgeon: Berniece Salines, DO;  Location: [facility];  Service: Cardiovascular;  Laterality: N/A;   carotid doppler  09/17/2008   rigt and left ICAs 0-49%;mildly  abnormal   CATARACT EXTRACTION Left 2020   Dr. Kathlen Mody   COLONOSCOPY N/A 05/16/2017   Procedure: COLONOSCOPY;  Surgeon: Rogene Houston, MD;  [facility];  Service: Endoscopy;  Laterality: N/A;  930   COLONOSCOPY WITH PROPOFOL N/A 05/24/2022   Procedure: COLONOSCOPY WITH PROPOFOL;   Surgeon: Daneil Dolin, MD;  [facility];  Service: Endoscopy;  Laterality: N/A;   DOPPLER ECHOCARDIOGRAPHY  05/25/2009   EF 50-55%,LA mildly dilated, LV function normal   ELECTROPHYSIOLOGIC STUDY N/A 04/05/2015   Procedure: Cardioversion;  Surgeon: Sanda Klein, MD;  Location: [facility];  Service: Cardiovascular;  Laterality: N/A;   ELECTROPHYSIOLOGIC STUDY N/A 09/06/2015   Procedure: Atrial Fibrillation Ablation;  Surgeon: Thompson Grayer, MD;  Location: [facility];  Service: Cardiovascular;  Laterality: N/A;   ELECTROPHYSIOLOGIC STUDY N/A 07/12/2016   redo afib ablation by Dr Rayann Heman   ENTEROSCOPY N/A 05/30/2022   Procedure: ENTEROSCOPY;  Surgeon: Daneil Dolin, MD;  [facility];  Service: Endoscopy;  Laterality: N/A;   ESOPHAGOGASTRODUODENOSCOPY (EGD) WITH PROPOFOL N/A 05/24/2022   Procedure: ESOPHAGOGASTRODUODENOSCOPY (EGD) WITH PROPOFOL;  Surgeon: Daneil Dolin, MD;  [facility];  Service: Endoscopy;  Laterality: N/A;   EXCISIONAL HEMORRHOIDECTOMY     "inside and out"   EYE SURGERY Left 2020   Cat Sx - Dr. Kathlen Mody   FINE NEEDLE ASPIRATION Right    knee; "drew ~ 1 quart off"   GIVENS CAPSULE STUDY N/A 05/25/2022   Procedure: GIVENS CAPSULE STUDY;  Surgeon: Eloise Harman, DO;  [facility];  Service: Endoscopy;  Laterality: N/A;   HERNIA REPAIR     LAPAROSCOPIC CHOLECYSTECTOMY     MELANOMA EXCISION Right    "neck"   NM MYOCAR PERF WALL MOTION  02/21/2012   EF 61% ,EXERCISE 7 METS. exercise stopped due to wheezing and shortness of breathe   POLYPECTOMY  05/16/2017   Procedure: POLYPECTOMY;  Surgeon: Rogene Houston, MD;  [facility];  Service: Endoscopy;;  colon   POLYPECTOMY  05/24/2022   Procedure: POLYPECTOMY;  Surgeon: Daneil Dolin, MD;  [facility];  Service: Endoscopy;;   PROSTATECTOMY     SHOULDER OPEN ROTATOR CUFF REPAIR Right X 2   SUBMUCOSAL TATTOO INJECTION  05/30/2022    Procedure: SUBMUCOSAL TATTOO INJECTION;  Surgeon: Daneil Dolin, MD;  [facility];  Service: Endoscopy;;   TEE WITHOUT CARDIOVERSION N/A 09/05/2015   Procedure: TRANSESOPHAGEAL ECHOCARDIOGRAM (TEE);  Surgeon: Sueanne Margarita, MD;  Location[facility];  Service: Cardiovascular;  Laterality: N/A;    Current Outpatient Medications  Medication Sig Dispense Refill   albuterol (VENTOLIN HFA) 108 (90 Base) MCG/ACT inhaler Inhale 2 puffs into the lungs every 4 (four) hours as needed for wheezing or shortness of breath.     amiodarone (PACERONE) 200 MG tablet TAKE 1 TABLET BY MOUTH EVERY DAY 90 tablet 1   carvedilol (COREG) 3.125 MG tablet Take 1 tablet (3.125 mg total) by mouth 2 (two) times daily. 180 tablet 1   COMBIGAN 0.2-0.5 % ophthalmic solution Apply 1 drop to eye 2 (two) times daily.     CRANBERRY PO Take 1 tablet by mouth daily.     diphenhydramine-acetaminophen (TYLENOL PM) 25-500 MG TABS tablet Take 2 tablets by mouth at bedtime.     docusate sodium (COLACE) 100 MG capsule Take 1 capsule (100 mg total) by mouth daily. 60 capsule 2   ferrous sulfate 325 (65 FE) MG tablet Take 1 tablet (325 mg total) by mouth daily with breakfast. 60 tablet 0   gemfibrozil (LOPID) 600 MG tablet Take 600 mg by mouth at bedtime.      glimepiride (AMARYL) 2 MG tablet Take 2 mg by mouth at bedtime.     Insulin Glargine (BASAGLAR KWIKPEN) 100 UNIT/ML Inject 35 Units into the skin at bedtime. (Patient taking differently: Inject 30 Units into the skin at bedtime.) 40 mL    KLOR-CON M20 20 MEQ tablet TAKE 1 TABLET BY MOUTH EVERY DAY 90 tablet 1   lisinopril (ZESTRIL) 20 MG tablet Take 1 tablet (20 mg total) by mouth daily. 90 tablet 1   Melatonin 10 MG TABS Take 10 mg by mouth at bedtime.     metFORMIN (GLUCOPHAGE-XR) 500 MG 24 hr tablet Take 1,000 mg by mouth in the morning and at bedtime.     mineral oil liquid Take 15 mLs by mouth daily as needed for moderate constipation.     Multiple  Vitamins-Minerals (MULTIVITAMIN WITH MINERALS) tablet Take 1 tablet by mouth daily. Centrum men 50+     Omega-3 1000 MG CAPS Take 1,000 mg by mouth daily.     OZEMPIC, 1 MG/DOSE, 4 MG/3ML SOPN Inject 1 mg into the skin once a week.     pantoprazole (PROTONIX) 40 MG tablet Take 1 tablet (40 mg total) by mouth 2 (two) times daily. 60 tablet 0   valACYclovir (VALTREX) 1000 MG tablet Take 1,000 mg by mouth at bedtime.     torsemide (DEMADEX) 20 MG tablet Take  1 tablet (20 mg total) by mouth 2 (two) times daily. (Patient not taking: Reported on 06/07/2022) 30 tablet 2   No current facility-administered medications for this visit.    Allergies as of 06/08/2022 - Review Complete 06/08/2022  Allergen Reaction Noted   Azithromycin Nausea Only 11/10/2021   Tape Rash and Other (See Comments) 01/28/2012    Family History  Problem Relation Age of Onset   Heart disease Mother    Lung cancer Mother    Heart attack Mother 42   Diabetes Father    Heart disease Father    Stroke Brother    Healthy Daughter    Colon cancer Maternal Aunt        3s    Social History   Socioeconomic History   Marital status: Married    Spouse name: Not on file   Number of children: Not on file   Years of education: Not on file   Highest education level: Not on file  Occupational History   Occupation: Retired  Tobacco Use   Smoking status: Former    Packs/day: 2.00    Years: 27.00    Total pack years: 54.00    Types: Cigarettes    Quit date: 10/31/1983    Years since quitting: 38.6   Smokeless tobacco: Never   Tobacco comments:    Former smoker 09/27/21  Vaping Use   Vaping Use: Never used  Substance and Sexual Activity   Alcohol use: No    Alcohol/week: 0.0 standard drinks of alcohol    Comment: "used to drink; stopped ~ 2008"   Drug use: No   Sexual activity: Not on file  Other Topics Concern   Not on file  Social History Narrative   Lives in [place], [place] with wife.   Social Determinants of  Health   Financial Resource Strain: Not on file  Food Insecurity: Not on file  Transportation Needs: Not on file  Physical Activity: Not on file  Stress: Not on file  Social Connections: Not on file   Review of systems General: negative for malaise, night sweats, fever, chills, weight loss Neck: Negative for lumps, goiter, pain and significant neck swelling Resp: Negative for cough, wheezing, dyspnea at rest CV: Negative for chest pain, leg swelling, palpitations, +SOB  GI: denies melena, hematochezia, nausea, vomiting, diarrhea, dysphagia, odyonophagia, early satiety or unintentional weight loss. +constipation  MSK: Negative for joint pain or swelling, back pain, and muscle pain. Derm: Negative for itching or rash Psych: Denies depression, anxiety, memory loss, confusion. No homicidal or suicidal ideation.  Heme: Negative for prolonged bleeding, bruising easily, and swollen nodes. Endocrine: Negative for cold or heat intolerance, polyuria, polydipsia and goiter. Neuro: negative for tremor, gait imbalance, syncope and seizures +dizziness The remainder of the review of systems is noncontributory.  Physical Exam: BP (!) 118/54 (BP Location: Left Arm, Patient Position: Sitting, Cuff Size: Large)   Pulse (!) 55   Temp 97.9 F (36.6 C) (Oral)   Ht '5\' 10"'$  (1.778 m)   Wt 236 lb (107 kg)   BMI 33.86 kg/m  General:   Alert and oriented. No distress noted. Pleasant and cooperative.  Head:  Normocephalic and atraumatic. Eyes:  Conjuctiva clear without scleral icterus. Mouth:  Oral mucosa pink and moist. Good dentition. No lesions. Heart: Normal rate and rhythm, s1 and s2 heart sounds present.  Lungs: Clear lung sounds in all lobes. Respirations equal and unlabored. Abdomen:  +BS, soft, non-tender and non-distended. No rebound or guarding. No  HSM or masses noted. Derm: No palmar erythema or jaundice Msk:  Symmetrical without gross deformities. Normal posture. Extremities:  Without  edema. Neurologic:  Alert and  oriented x4 Psych:  Alert and cooperative. Normal mood and affect.  Invalid input(s): "6 MONTHS"   ASSESSMENT: Lucas Dudley is a 81 y.o. male presenting today for follow up after recent hospitalization for anemia.   Hgb 7.8 on admission with total of 2 units PRBCs transfused. Patient underwent EGD, Colonoscopy, capsule study and small bowel enteroscopy with most significant findings of innocent appearing AVMs and submucosal nodule in small bowel for which biopsy was benign. No obvious source of bleeding identified. Last hgb was 9.32 on 06/01/22. Patient remains on PO iron pills. Denies rectal bleeding, melena, n/v or abdominal pain. He continues to feel fatigued, having some SOB on exertion and some dizziness upon standing. It remains to be seen if all of these symptoms are secondary to his anemia or from superimposed A fib/hypotension. He remains off of his Eliquis with upcoming consult for Watchman's procedure for his A fib. We will recheck hemoglobin and iron studies today. There was mention of further evaluation of small bowel nodule via EUS but no definitive recommendations for this, I will discuss with Dr. Jenetta Downer if there is a need to pursue further evaluation. For now will continue with PPI BID and Daily Iron pills. Patient to make me aware of worsening SOB, dizziness, or syncope, as well as rectal bleeding or melena.   In regards to constipation, this is baseline, likely some worse with PO Iron, will start miralax with dosing as provided. He will make me aware if this does not improve.  PLAN:  Continue PO iron and PPI BID 2. Repeat CBC/iron studies  3. Discuss need for EUS with Dr. Jenetta Downer 4. Start taking Miralax 1 capful every day for one week. If bowel movements do not improve, increase to 1 capful every 12 hours. If after two weeks there is no improvement, increase to 1 capful every 8 hours 5. Pt to make me aware of rectal bleeding, melena, worsening  dizziness, sob or syncope  All questions were answered, patient verbalized understanding and is in agreement with plan as outlined above.   Follow Up: 3 months   Asante Ritacco L. Alver Sorrow, MSN, APRN, AGNP-C Adult-Gerontology Nurse Practitioner [facility]

## 2022-06-09 LAB — IRON,TIBC AND FERRITIN PANEL
%SAT: 6 % (calc) — ABNORMAL LOW (ref 20–48)
Ferritin: 26 ng/mL (ref 24–380)
Iron: 25 ug/dL — ABNORMAL LOW (ref 50–180)
TIBC: 399 mcg/dL (calc) (ref 250–425)

## 2022-06-09 LAB — HEMOGLOBIN AND HEMATOCRIT, BLOOD
HCT: 32.3 % — ABNORMAL LOW (ref 38.5–50.0)
Hemoglobin: 9.7 g/dL — ABNORMAL LOW (ref 13.2–17.1)

## 2022-06-11 ENCOUNTER — Telehealth: Payer: Self-pay

## 2022-06-11 NOTE — Telephone Encounter (Signed)
Per [employer] review of 06/05/2022 cCT, "Watchman CT ([employer]) report for [facility]rm  Broccoli morphology  Ostium of 33m AVG and 248mMAX Depth to around 206mInferior (SI) and mid (AP) stick, a double curve sheath, and a 70m13mtchman FLX"

## 2022-06-12 ENCOUNTER — Other Ambulatory Visit (INDEPENDENT_AMBULATORY_CARE_PROVIDER_SITE_OTHER): Payer: Self-pay | Admitting: Gastroenterology

## 2022-06-12 MED ORDER — FERROUS SULFATE 325 (65 FE) MG PO TABS: 325.0000 mg | ORAL_TABLET | Freq: Two times a day (BID) | ORAL | 1 refills | Status: DC

## 2022-06-13 ENCOUNTER — Other Ambulatory Visit: Payer: Self-pay

## 2022-06-13 ENCOUNTER — Ambulatory Visit: Payer: Medicare Other | Attending: Cardiovascular Disease | Admitting: Cardiovascular Disease

## 2022-06-13 ENCOUNTER — Encounter: Payer: Self-pay | Admitting: Cardiovascular Disease

## 2022-06-13 VITALS — BP 140/80 | HR 55 | Ht 70.0 in | Wt 239.0 lb

## 2022-06-13 DIAGNOSIS — Z01810 Encounter for preprocedural cardiovascular examination: Secondary | ICD-10-CM | POA: Diagnosis not present

## 2022-06-13 DIAGNOSIS — I480 Paroxysmal atrial fibrillation: Secondary | ICD-10-CM

## 2022-06-13 MED ORDER — SODIUM CHLORIDE 0.9 % IV SOLN: INTRAVENOUS | Status: DC

## 2022-06-13 MED ORDER — APIXABAN 2.5 MG PO TABS: ORAL_TABLET | ORAL | 5 refills | Status: DC

## 2022-06-13 NOTE — Progress Notes (Signed)
Watchman Consult Note   Date:  06/13/2022   ID:  Lucas Dudley, DOB 1940/10/26, MRN [id number]  PCP:  [name], MD  Cardiologist:  Dr [name]  Primary Electrophysiologist: Dr. Rayann Heman Referring Physician: Dr [name]   CC: to discuss Watchman implant    History of Present Illness: Lucas Dudley is a 81 y.o. male referred by Dr [name] for evaluation of atrial fibrillation and stroke prevention. He has persistent atrial fibrillation.  The patient has been evaluated by their referring physician and is felt to be a poor candidate for long term [place] due to GI bleeding.  He therefore presents today for Watchman evaluation.   The patient has persistent atrial fibrillation and he has been managed with a strategy of rhythm control using amiodarone and anticoagulation with apixaban.  He was recently diagnosed with anemia with hemoglobin 7.6 and referred to the emergency department.  He was hospitalized with acute anemia and underwent upper and lower endoscopy studies which showed no significant bleeding source.  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  He required packed red blood cell transfusions and his apixaban was held throughout his hospitalization.  He required IV diuresis for volume overload.  He improved clinically with diuresis and his hemoglobin ultimately stabilized.  He has stayed off of anticoagulation since discharge.  He followed up in the clinic with Dr. [name] who has referred him for consideration of left atrial appendage closure as an alternative to anticoagulation in the context of high recurrent bleeding risk.  Today, he denies symptoms of palpitations, chest pain, shortness of breath, orthopnea, PND, lower extremity edema, claudication, dizziness, presyncope, syncope, bleeding, or neurologic sequela. The patient is tolerating medications without difficulties and is otherwise without complaint today.    Past Medical  History:  Diagnosis Date   Arthritis    Atrial fibrillation (HCC)    CAD (coronary artery disease)    a. Cath 03/17/15 showing 100% ostial D1, 50% prox LAD to mid LAD, 40% RPDA stenosis. Med rx. // Myoview 01/2020: EF 31 diffuse perfusion defect without reversibility (suspect artifact); reviewed with Dr. 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[place])    OSA on CPAP 2012   Prostate cancer ([place])    Type II diabetes mellitus ([place])    Past Surgical History:  Procedure Laterality Date   ABDOMINAL HERNIA REPAIR     w/mesh   ATRIAL FIBRILLATION ABLATION N/A 06/06/2021   Procedure: ATRIAL FIBRILLATION ABLATION;  Surgeon: Thompson Grayer, MD;  Location: [facility];  Service: Cardiovascular;  Laterality: N/A;   BIOPSY  05/24/2022   Procedure: BIOPSY;  Surgeon: Daneil Dolin, MD;  [facility];  Service: Endoscopy;;   BIOPSY  05/30/2022   Procedure: BIOPSY;  Surgeon: Daneil Dolin, MD;  [facility];  Service: Endoscopy;;   CARDIAC CATHETERIZATION N/A 03/17/2015   Procedure: Left Heart Cath and Coronary Angiography;  Surgeon: Lorretta Harp, MD;  Location: [facility];  Service: Cardiovascular;  Laterality: N/A;  CARDIOVERSION N/A 08/09/2021   Procedure: CARDIOVERSION;  Surgeon: [name], MD;  Location: [facility];  Service: Cardiovascular;  Laterality: N/A;   CARDIOVERSION N/A 11/10/2021   Procedure: CARDIOVERSION;  Surgeon: Berniece Salines, DO;  Location: [facility];  Service: Cardiovascular;  Laterality: N/A;   carotid doppler  09/17/2008   rigt and left ICAs 0-49%;mildly  abnormal   CATARACT EXTRACTION Left 2020   Dr.  Kathlen Mody   COLONOSCOPY N/A 05/16/2017   Procedure: COLONOSCOPY;  Surgeon: Rogene Houston, MD;  [facility];  Service: Endoscopy;  Laterality: N/A;  930   COLONOSCOPY WITH PROPOFOL N/A 05/24/2022   Procedure: COLONOSCOPY WITH PROPOFOL;  Surgeon: Daneil Dolin, MD;  [facility];  Service: Endoscopy;  Laterality: N/A;   DOPPLER ECHOCARDIOGRAPHY  05/25/2009   EF 50-55%,LA mildly dilated, LV function normal   ELECTROPHYSIOLOGIC STUDY N/A 04/05/2015   Procedure: Cardioversion;  Surgeon: Sanda Klein, MD;  Location: [facility];  Service: Cardiovascular;  Laterality: N/A;   ELECTROPHYSIOLOGIC STUDY N/A 09/06/2015   Procedure: Atrial Fibrillation Ablation;  Surgeon: Thompson Grayer, MD;  Location: [facility];  Service: Cardiovascular;  Laterality: N/A;   ELECTROPHYSIOLOGIC STUDY N/A 07/12/2016   redo afib ablation by Dr Rayann Heman   ENTEROSCOPY N/A 05/30/2022   Procedure: ENTEROSCOPY;  Surgeon: Daneil Dolin, MD;  [facility];  Service: Endoscopy;  Laterality: N/A;   ESOPHAGOGASTRODUODENOSCOPY (EGD) WITH PROPOFOL N/A 05/24/2022   Procedure: ESOPHAGOGASTRODUODENOSCOPY (EGD) WITH PROPOFOL;  Surgeon: Daneil Dolin, MD;  [facility];  Service: Endoscopy;  Laterality: N/A;   EXCISIONAL HEMORRHOIDECTOMY     "inside and out"   EYE SURGERY Left 2020   Cat Sx - Dr. Kathlen Mody   FINE NEEDLE ASPIRATION Right    knee; "drew ~ 1 quart off"   GIVENS CAPSULE STUDY N/A 05/25/2022   Procedure: GIVENS CAPSULE STUDY;  Surgeon: Eloise Harman, DO;  [facility];  Service: Endoscopy;  Laterality: N/A;   HERNIA REPAIR     LAPAROSCOPIC CHOLECYSTECTOMY     MELANOMA EXCISION Right    "neck"   NM MYOCAR PERF WALL MOTION  02/21/2012   EF 61% ,EXERCISE 7 METS. exercise stopped due to wheezing and shortness of breathe   POLYPECTOMY  05/16/2017   Procedure: POLYPECTOMY;  Surgeon: Rogene Houston, MD;  [facility];  Service: Endoscopy;;  colon    POLYPECTOMY  05/24/2022   Procedure: POLYPECTOMY;  Surgeon: Daneil Dolin, MD;  [facility];  Service: Endoscopy;;   PROSTATECTOMY     SHOULDER OPEN ROTATOR CUFF REPAIR Right X 2   SUBMUCOSAL TATTOO INJECTION  05/30/2022   Procedure: SUBMUCOSAL TATTOO INJECTION;  Surgeon: Daneil Dolin, MD;  [facility];  Service: Endoscopy;;   TEE WITHOUT CARDIOVERSION N/A 09/05/2015   Procedure: TRANSESOPHAGEAL ECHOCARDIOGRAM (TEE);  Surgeon: Sueanne Margarita, MD;  [facility];  Service: Cardiovascular;  Laterality: N/A;     Current Outpatient Medications  Medication Sig Dispense Refill   amiodarone (PACERONE) 200 MG tablet TAKE 1 TABLET BY MOUTH EVERY DAY 90 tablet 1   carvedilol (COREG) 3.125 MG tablet Take 1 tablet (3.125 mg total) by mouth 2 (two) times daily. 180 tablet 1   COMBIGAN 0.2-0.5 % ophthalmic solution Apply 1 drop to eye 2 (two) times daily.     CRANBERRY PO Take 1 tablet by mouth daily.     diphenhydramine-acetaminophen (TYLENOL PM) 25-500 MG TABS tablet Take 2  tablets by mouth at bedtime.     docusate sodium (COLACE) 100 MG capsule Take 1 capsule (100 mg total) by mouth daily. 60 capsule 2   ferrous sulfate 325 (65 FE) MG tablet Take 1 tablet (325 mg total) by mouth 2 (two) times daily with a meal. 120 tablet 1   gemfibrozil (LOPID) 600 MG tablet Take 600 mg by mouth at bedtime.      glimepiride (AMARYL) 2 MG tablet Take 2 mg by mouth at bedtime.     Insulin Glargine (BASAGLAR KWIKPEN) 100 UNIT/ML Inject 35 Units into the skin at bedtime. (Patient taking differently: Inject 30 Units into the skin at bedtime.) 40 mL    KLOR-CON M20 20 MEQ tablet TAKE 1 TABLET BY MOUTH EVERY DAY 90 tablet 1   lisinopril (ZESTRIL) 20 MG tablet Take 1 tablet (20 mg total) by mouth daily. 90 tablet 1   Melatonin 10 MG TABS Take 10 mg by mouth at bedtime.     metFORMIN (GLUCOPHAGE-XR) 500 MG 24 hr tablet Take 1,000 mg by mouth in the morning and at bedtime.     mineral oil  liquid Take 15 mLs by mouth daily as needed for moderate constipation.     Multiple Vitamins-Minerals (MULTIVITAMIN WITH MINERALS) tablet Take 1 tablet by mouth daily. Centrum men 50+     Omega-3 1000 MG CAPS Take 1,000 mg by mouth daily.     OZEMPIC, 1 MG/DOSE, 4 MG/3ML SOPN Inject 1 mg into the skin once a week.     pantoprazole (PROTONIX) 40 MG tablet Take 1 tablet (40 mg total) by mouth 2 (two) times daily. 60 tablet 0   torsemide (DEMADEX) 20 MG tablet Take 1 tablet (20 mg total) by mouth 2 (two) times daily. 30 tablet 2   valACYclovir (VALTREX) 1000 MG tablet Take 1,000 mg by mouth at bedtime.     albuterol (VENTOLIN HFA) 108 (90 Base) MCG/ACT inhaler Inhale 2 puffs into the lungs every 4 (four) hours as needed for wheezing or shortness of breath. (Patient not taking: Reported on 06/13/2022)     No current facility-administered medications for this visit.    Allergies:   Azithromycin and Tape   Social History:  The patient  reports that he quit smoking about 38 years ago. His smoking use included cigarettes. He has a 54.00 pack-year smoking history. He has never used smokeless tobacco. He reports that he does not drink alcohol and does not use drugs.   Family History:  The patient's  family history includes Colon cancer in his maternal aunt; Diabetes in his father; Healthy in his daughter; Heart attack (age of onset: 22) in his mother; Heart disease in his father and mother; Lung cancer in his mother; Stroke in his brother.    ROS:  Please see the history of present illness.   All other systems are reviewed and negative.    PHYSICAL EXAM: VS:  BP (!) 140/80   Pulse (!) 55   Ht '5\' 10"'  (1.778 m)   Wt 239 lb (108.4 kg)   SpO2 97%   BMI 34.29 kg/m  , BMI Body mass index is 34.29 kg/m. GEN: Well nourished, well developed, in no acute distress  HEENT: normal  Neck: no JVD, carotid bruits, or masses Cardiac: RRR; no murmurs, rubs, or gallops Respiratory:  clear to auscultation  bilaterally, normal work of breathing GI: soft, nontender, nondistended, + BS MS: no deformity or atrophy  Skin: warm and dry.  Trace bilateral pretibial edema  Neuro:  Strength and sensation are intact Psych: euthymic mood, full affect  EKG:  EKG is not ordered today.   Recent Labs: 05/21/2022: ALT 14; B Natriuretic Peptide 166.2; TSH 3.037 06/01/2022: BUN 22; Creatinine, Ser 1.36; Magnesium 1.6; Platelets 382; Potassium 3.6; Sodium 135 06/08/2022: Hemoglobin 9.7    Lipid Panel  No results found for: "CHOL", "TRIG", "HDL", "CHOLHDL", "VLDL", "LDLCALC", "LDLDIRECT"   Wt Readings from Last 3 Encounters:  06/13/22 239 lb (108.4 kg)  06/08/22 236 lb (107 kg)  06/04/22 235 lb 9.6 oz (106.9 kg)      Other studies Reviewed: Additional studies/ records that were reviewed today include:  2D echocardiogram 06/06/2022: 1. Left ventricular ejection fraction, by estimation, is 60 to 65%. The  left ventricle has normal function. The left ventricle has no regional  wall motion abnormalities. There is mild left ventricular hypertrophy.  Left ventricular diastolic parameters  were normal. The average left ventricular global longitudinal strain is  -17.7 %. The global longitudinal strain is normal.   2. Right ventricular systolic function is low normal. The right  ventricular size is normal. There is moderately elevated pulmonary artery  systolic pressure. The estimated right ventricular systolic pressure is  50.0 mmHg.   3. Left atrial size was mildly dilated.   4. The mitral valve is grossly normal. Trivial mitral valve  regurgitation.   5. The aortic valve is tricuspid. Aortic valve regurgitation is not  visualized. Aortic valve sclerosis/calcification is present, without any  evidence of aortic stenosis.   6. The inferior vena cava is normal in size with greater than 50%  respiratory variability, suggesting right atrial pressure of 3 mmHg.     ASSESSMENT AND PLAN:  1.  Persistent  atrial fibrillation I have seen FABIO WAH is a 81 y.o. male in the office today who has been referred for a Watchman left atrial appendage closure device.  He has a history of persistent atrial fibrillation.    Patient's atrial fibrillation-related risk scores are below:  CHA2DS2-VASc Score = 5   This indicates a 7.2% annual risk of stroke. The patient's score is based upon: CHF History: 0 HTN History: 1 Diabetes History: 1 Stroke History: 0 Vascular Disease History: 1 Age Score: 2 Gender Score: 0       HAS-BLED score  Hypertension No  Abnormal renal and liver function (Dialysis, transplant, Cr >2.26 mg/dL /Cirrhosis or Bilirubin >2x Normal or AST/ALT/AP >3x Normal) No  Stroke No  Bleeding Yes  Labile INR (Unstable/high INR) No  Elderly (>65) Yes  Drugs or alcohol (? 8 drinks/week, anti-plt or NSAID) No   Unfortunately, He is not felt to be a long term anticoagulation candidate secondary to gastrointestinal blood loss from small bowel AVMs.  The patients chart has been reviewed and I along with their referring cardiologist feel that they would be a candidate for short term oral anticoagulation.  Procedural risks for the Watchman implant have been reviewed with the patient including a 1% risk of stroke, 1% risk of perforation or pericardial effusion, 0.1% risk of device embolization.  Given the patient's poor candidacy for long-term oral anticoagulation, ability to tolerate short term oral anticoagulation, I have recommended the Watchman FLX left atrial appendage closure device through a shared decision making conversation with the patient.   The patient has undergone previous cardiac CTA studies demonstrating suitable left atrial appendage anatomy for implantation of a watchman FLXdevice.  We will plan to minimize contrast during the procedure because of the patient's  reduced GFR.  We discussed anticoagulation strategies, and because of his excessive bleeding risk, he will be  treated with low-dose apixaban 2.5 mg twice daily to start 5 days before the procedure.  He will hold apixaban the morning of the procedure only if he has an afternoon case.  We will hold his torsemide the day of the procedure and we will hold his lisinopril 48 hours before the procedure.  The patient will spend 1 night in the hospital because of his chronic kidney disease and diastolic heart failure.  They report that he has had problems with volume overload in the past when he has procedures.  In reviewing his labs, he had a recent bump in his creatinine from a baseline of about 1.3 now up to 2.37.  We have recommended that he hold lisinopril until after the procedure.  We are going to repeat a be met today to reassess and we may need to adjust his torsemide dosing as well.   Current medicines are reviewed at length with the patient today.   The patient does not have concerns regarding his medicines.  The following changes were made today:  none  Labs/ tests ordered today include:  No orders of the defined types were placed in this encounter.    Signed, Sherren Mocha, MD 06/13/2022  1:53 PM     [place] [id number] [phone] (office) [phone] (fax)

## 2022-06-13 NOTE — H&P (View-Only) (Signed)
Watchman Consult Note   Date:  06/13/2022   ID:  Lucas Dudley, DOB 19-Mar-1941, MRN [id number]  PCP:  [name], MD  Cardiologist:  Dr [name]  Primary Electrophysiologist: Dr. Rayann Heman Referring Physician: Dr [name]   CC: to discuss Watchman implant    History of Present Illness: Lucas Dudley is a 81 y.o. male referred by Dr [name] for evaluation of atrial fibrillation and stroke prevention. He has persistent atrial fibrillation.  The patient has been evaluated by their referring physician and is felt to be a poor candidate for long term [place] due to GI bleeding.  He therefore presents today for Watchman evaluation.   The patient has persistent atrial fibrillation and he has been managed with a strategy of rhythm control using amiodarone and anticoagulation with apixaban.  He was recently diagnosed with anemia with hemoglobin 7.6 and referred to the emergency department.  He was hospitalized with acute anemia and underwent upper and lower endoscopy studies which showed no significant bleeding source.  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  He required packed red blood cell transfusions and his apixaban was held throughout his hospitalization.  He required IV diuresis for volume overload.  He improved clinically with diuresis and his hemoglobin ultimately stabilized.  He has stayed off of anticoagulation since discharge.  He followed up in the clinic with Dr. [name] who has referred him for consideration of left atrial appendage closure as an alternative to anticoagulation in the context of high recurrent bleeding risk.  Today, he denies symptoms of palpitations, chest pain, shortness of breath, orthopnea, PND, lower extremity edema, claudication, dizziness, presyncope, syncope, bleeding, or neurologic sequela. The patient is tolerating medications without difficulties and is otherwise without complaint today.    Past Medical  History:  Diagnosis Date   Arthritis    Atrial fibrillation (HCC)    CAD (coronary artery disease)    a. Cath 03/17/15 showing 100% ostial D1, 50% prox LAD to mid LAD, 40% RPDA stenosis. Med rx. // Myoview 01/2020: EF 31 diffuse perfusion defect without reversibility (suspect artifact); reviewed with Dr. Aggie Cosier study felt to be low risk   Cataract    Mixed form OD   Chronic diastolic CHF (congestive heart failure) (HCC)    Diabetic retinopathy ([place])    NPDR OU   Essential hypertension    Glaucoma    POAG OU   History of gout    Hyperlipidemia    Hypertensive retinopathy    OU   Kidney stones    Melanoma of neck (HCC)    NICM (nonischemic cardiomyopathy) ([place])    OSA on CPAP 2012   Prostate cancer ([place])    Type II diabetes mellitus ([place])    Past Surgical History:  Procedure Laterality Date   ABDOMINAL HERNIA REPAIR     w/mesh   ATRIAL FIBRILLATION ABLATION N/A 06/06/2021   Procedure: ATRIAL FIBRILLATION ABLATION;  Surgeon: Thompson Grayer, MD;  Location: [facility];  Service: Cardiovascular;  Laterality: N/A;   BIOPSY  05/24/2022   Procedure: BIOPSY;  Surgeon: Daneil Dolin, MD;  [facility];  Service: Endoscopy;;   BIOPSY  05/30/2022   Procedure: BIOPSY;  Surgeon: Daneil Dolin, MD;  [facility];  Service: Endoscopy;;   CARDIAC CATHETERIZATION N/A 03/17/2015   Procedure: Left Heart Cath and Coronary Angiography;  Surgeon: Lorretta Harp, MD;  Location: [facility] [facility];  Service: Cardiovascular;  Laterality: N/A;  CARDIOVERSION N/A 08/09/2021   Procedure: CARDIOVERSION;  Surgeon: [name], MD;  Location: [facility];  Service: Cardiovascular;  Laterality: N/A;   CARDIOVERSION N/A 11/10/2021   Procedure: CARDIOVERSION;  Surgeon: Berniece Salines, DO;  Location: [facility];  Service: Cardiovascular;  Laterality: N/A;   carotid doppler  09/17/2008   rigt and left ICAs 0-49%;mildly  abnormal   CATARACT EXTRACTION Left 2020   Dr.  Kathlen Mody   COLONOSCOPY N/A 05/16/2017   Procedure: COLONOSCOPY;  Surgeon: Rogene Houston, MD;  [facility];  Service: Endoscopy;  Laterality: N/A;  930   COLONOSCOPY WITH PROPOFOL N/A 05/24/2022   Procedure: COLONOSCOPY WITH PROPOFOL;  Surgeon: Daneil Dolin, MD;  [facility];  Service: Endoscopy;  Laterality: N/A;   DOPPLER ECHOCARDIOGRAPHY  05/25/2009   EF 50-55%,LA mildly dilated, LV function normal   ELECTROPHYSIOLOGIC STUDY N/A 04/05/2015   Procedure: Cardioversion;  Surgeon: Sanda Klein, MD;  Location: [place];  Service: Cardiovascular;  Laterality: N/A;   ELECTROPHYSIOLOGIC STUDY N/A 09/06/2015   Procedure: Atrial Fibrillation Ablation;  Surgeon: Thompson Grayer, MD;  Location: [facility];  Service: Cardiovascular;  Laterality: N/A;   ELECTROPHYSIOLOGIC STUDY N/A 07/12/2016   redo afib ablation by Dr Rayann Heman   ENTEROSCOPY N/A 05/30/2022   Procedure: ENTEROSCOPY;  Surgeon: Daneil Dolin, MD;  [facility];  Service: Endoscopy;  Laterality: N/A;   ESOPHAGOGASTRODUODENOSCOPY (EGD) WITH PROPOFOL N/A 05/24/2022   Procedure: ESOPHAGOGASTRODUODENOSCOPY (EGD) WITH PROPOFOL;  Surgeon: Daneil Dolin, MD;  [facility];  Service: Endoscopy;  Laterality: N/A;   EXCISIONAL HEMORRHOIDECTOMY     "inside and out"   EYE SURGERY Left 2020   Cat Sx - Dr. Kathlen Mody   FINE NEEDLE ASPIRATION Right    knee; "drew ~ 1 quart off"   GIVENS CAPSULE STUDY N/A 05/25/2022   Procedure: GIVENS CAPSULE STUDY;  Surgeon: Eloise Harman, DO;  [facility];  Service: Endoscopy;  Laterality: N/A;   HERNIA REPAIR     LAPAROSCOPIC CHOLECYSTECTOMY     MELANOMA EXCISION Right    "neck"   NM MYOCAR PERF WALL MOTION  02/21/2012   EF 61% ,EXERCISE 7 METS. exercise stopped due to wheezing and shortness of breathe   POLYPECTOMY  05/16/2017   Procedure: POLYPECTOMY;  Surgeon: Rogene Houston, MD;  [facility];  Service: Endoscopy;;  colon    POLYPECTOMY  05/24/2022   Procedure: POLYPECTOMY;  Surgeon: Daneil Dolin, MD;  [facility];  Service: Endoscopy;;   PROSTATECTOMY     SHOULDER OPEN ROTATOR CUFF REPAIR Right X 2   SUBMUCOSAL TATTOO INJECTION  05/30/2022   Procedure: SUBMUCOSAL TATTOO INJECTION;  Surgeon: Daneil Dolin, MD;  [facility];  Service: Endoscopy;;   TEE WITHOUT CARDIOVERSION N/A 09/05/2015   Procedure: TRANSESOPHAGEAL ECHOCARDIOGRAM (TEE);  Surgeon: Sueanne Margarita, MD;  Location[facility];  Service: Cardiovascular;  Laterality: N/A;     Current Outpatient Medications  Medication Sig Dispense Refill   amiodarone (PACERONE) 200 MG tablet TAKE 1 TABLET BY MOUTH EVERY DAY 90 tablet 1   carvedilol (COREG) 3.125 MG tablet Take 1 tablet (3.125 mg total) by mouth 2 (two) times daily. 180 tablet 1   COMBIGAN 0.2-0.5 % ophthalmic solution Apply 1 drop to eye 2 (two) times daily.     CRANBERRY PO Take 1 tablet by mouth daily.     diphenhydramine-acetaminophen (TYLENOL PM) 25-500 MG TABS tablet Take 2  tablets by mouth at bedtime.     docusate sodium (COLACE) 100 MG capsule Take 1 capsule (100 mg total) by mouth daily. 60 capsule 2   ferrous sulfate 325 (65 FE) MG tablet Take 1 tablet (325 mg total) by mouth 2 (two) times daily with a meal. 120 tablet 1   gemfibrozil (LOPID) 600 MG tablet Take 600 mg by mouth at bedtime.      glimepiride (AMARYL) 2 MG tablet Take 2 mg by mouth at bedtime.     Insulin Glargine (BASAGLAR KWIKPEN) 100 UNIT/ML Inject 35 Units into the skin at bedtime. (Patient taking differently: Inject 30 Units into the skin at bedtime.) 40 mL    KLOR-CON M20 20 MEQ tablet TAKE 1 TABLET BY MOUTH EVERY DAY 90 tablet 1   lisinopril (ZESTRIL) 20 MG tablet Take 1 tablet (20 mg total) by mouth daily. 90 tablet 1   Melatonin 10 MG TABS Take 10 mg by mouth at bedtime.     metFORMIN (GLUCOPHAGE-XR) 500 MG 24 hr tablet Take 1,000 mg by mouth in the morning and at bedtime.     mineral oil  liquid Take 15 mLs by mouth daily as needed for moderate constipation.     Multiple Vitamins-Minerals (MULTIVITAMIN WITH MINERALS) tablet Take 1 tablet by mouth daily. Centrum men 50+     Omega-3 1000 MG CAPS Take 1,000 mg by mouth daily.     OZEMPIC, 1 MG/DOSE, 4 MG/3ML SOPN Inject 1 mg into the skin once a week.     pantoprazole (PROTONIX) 40 MG tablet Take 1 tablet (40 mg total) by mouth 2 (two) times daily. 60 tablet 0   torsemide (DEMADEX) 20 MG tablet Take 1 tablet (20 mg total) by mouth 2 (two) times daily. 30 tablet 2   valACYclovir (VALTREX) 1000 MG tablet Take 1,000 mg by mouth at bedtime.     albuterol (VENTOLIN HFA) 108 (90 Base) MCG/ACT inhaler Inhale 2 puffs into the lungs every 4 (four) hours as needed for wheezing or shortness of breath. (Patient not taking: Reported on 06/13/2022)     No current facility-administered medications for this visit.    Allergies:   Azithromycin and Tape   Social History:  The patient  reports that he quit smoking about 38 years ago. His smoking use included cigarettes. He has a 54.00 pack-year smoking history. He has never used smokeless tobacco. He reports that he does not drink alcohol and does not use drugs.   Family History:  The patient's  family history includes Colon cancer in his maternal aunt; Diabetes in his father; Healthy in his daughter; Heart attack (age of onset: 57) in his mother; Heart disease in his father and mother; Lung cancer in his mother; Stroke in his brother.    ROS:  Please see the history of present illness.   All other systems are reviewed and negative.    PHYSICAL EXAM: VS:  BP (!) 140/80   Pulse (!) 55   Ht '5\' 10"'  (1.778 m)   Wt 239 lb (108.4 kg)   SpO2 97%   BMI 34.29 kg/m  , BMI Body mass index is 34.29 kg/m. GEN: Well nourished, well developed, in no acute distress  HEENT: normal  Neck: no JVD, carotid bruits, or masses Cardiac: RRR; no murmurs, rubs, or gallops Respiratory:  clear to auscultation  bilaterally, normal work of breathing GI: soft, nontender, nondistended, + BS MS: no deformity or atrophy  Skin: warm and dry.  Trace bilateral pretibial edema  Neuro:  Strength and sensation are intact Psych: euthymic mood, full affect  EKG:  EKG is not ordered today.   Recent Labs: 05/21/2022: ALT 14; B Natriuretic Peptide 166.2; TSH 3.037 06/01/2022: BUN 22; Creatinine, Ser 1.36; Magnesium 1.6; Platelets 382; Potassium 3.6; Sodium 135 06/08/2022: Hemoglobin 9.7    Lipid Panel  No results found for: "CHOL", "TRIG", "HDL", "CHOLHDL", "VLDL", "LDLCALC", "LDLDIRECT"   Wt Readings from Last 3 Encounters:  06/13/22 239 lb (108.4 kg)  06/08/22 236 lb (107 kg)  06/04/22 235 lb 9.6 oz (106.9 kg)      Other studies Reviewed: Additional studies/ records that were reviewed today include:  2D echocardiogram 06/06/2022: 1. Left ventricular ejection fraction, by estimation, is 60 to 65%. The  left ventricle has normal function. The left ventricle has no regional  wall motion abnormalities. There is mild left ventricular hypertrophy.  Left ventricular diastolic parameters  were normal. The average left ventricular global longitudinal strain is  -17.7 %. The global longitudinal strain is normal.   2. Right ventricular systolic function is low normal. The right  ventricular size is normal. There is moderately elevated pulmonary artery  systolic pressure. The estimated right ventricular systolic pressure is  10.1 mmHg.   3. Left atrial size was mildly dilated.   4. The mitral valve is grossly normal. Trivial mitral valve  regurgitation.   5. The aortic valve is tricuspid. Aortic valve regurgitation is not  visualized. Aortic valve sclerosis/calcification is present, without any  evidence of aortic stenosis.   6. The inferior vena cava is normal in size with greater than 50%  respiratory variability, suggesting right atrial pressure of 3 mmHg.     ASSESSMENT AND PLAN:  1.  Persistent  atrial fibrillation I have seen Lucas Dudley is a 81 y.o. male in the office today who has been referred for a Watchman left atrial appendage closure device.  He has a history of persistent atrial fibrillation.    Patient's atrial fibrillation-related risk scores are below:  CHA2DS2-VASc Score = 5   This indicates a 7.2% annual risk of stroke. The patient's score is based upon: CHF History: 0 HTN History: 1 Diabetes History: 1 Stroke History: 0 Vascular Disease History: 1 Age Score: 2 Gender Score: 0       HAS-BLED score  Hypertension No  Abnormal renal and liver function (Dialysis, transplant, Cr >2.26 mg/dL /Cirrhosis or Bilirubin >2x Normal or AST/ALT/AP >3x Normal) No  Stroke No  Bleeding Yes  Labile INR (Unstable/high INR) No  Elderly (>65) Yes  Drugs or alcohol (? 8 drinks/week, anti-plt or NSAID) No   Unfortunately, He is not felt to be a long term anticoagulation candidate secondary to gastrointestinal blood loss from small bowel AVMs.  The patients chart has been reviewed and I along with their referring cardiologist feel that they would be a candidate for short term oral anticoagulation.  Procedural risks for the Watchman implant have been reviewed with the patient including a 1% risk of stroke, 1% risk of perforation or pericardial effusion, 0.1% risk of device embolization.  Given the patient's poor candidacy for long-term oral anticoagulation, ability to tolerate short term oral anticoagulation, I have recommended the Watchman FLX left atrial appendage closure device through a shared decision making conversation with the patient.   The patient has undergone previous cardiac CTA studies demonstrating suitable left atrial appendage anatomy for implantation of a watchman FLXdevice.  We will plan to minimize contrast during the procedure because of the patient's  reduced GFR.  We discussed anticoagulation strategies, and because of his excessive bleeding risk, he will be  treated with low-dose apixaban 2.5 mg twice daily to start 5 days before the procedure.  He will hold apixaban the morning of the procedure only if he has an afternoon case.  We will hold his torsemide the day of the procedure and we will hold his lisinopril 48 hours before the procedure.  The patient will spend 1 night in the hospital because of his chronic kidney disease and diastolic heart failure.  They report that he has had problems with volume overload in the past when he has procedures.  In reviewing his labs, he had a recent bump in his creatinine from a baseline of about 1.3 now up to 2.37.  We have recommended that he hold lisinopril until after the procedure.  We are going to repeat a be met today to reassess and we may need to adjust his torsemide dosing as well.   Current medicines are reviewed at length with the patient today.   The patient does not have concerns regarding his medicines.  The following changes were made today:  none  Labs/ tests ordered today include:  No orders of the defined types were placed in this encounter.    Signed, Sherren Mocha, MD 06/13/2022  1:53 PM     [address] [place] [address] [phone] (office) [phone] (fax)

## 2022-06-13 NOTE — Patient Instructions (Signed)
Medication Instructions:  HOLD Lisinopril until after the procedure HOLD Torsemide the day of procedure START Eliquis on 06/23/22 (5 days prior to procedure) and hold the night prior and day of procedure *If you need a refill on your cardiac medications before your next appointment, please call your pharmacy*   Lab Work: BMET today (will get repeat labs upon arrival to hospital) If you have labs (blood work) drawn today and your tests are completely normal, you will receive your results only by: [place] (if you have MyChart) OR A paper copy in the mail If you have any lab test that is abnormal or we need to change your treatment, we will call you to review the results.   Testing/Procedures: Watchman/LAAO procedure   Follow-Up: At [facility], you and your health needs are our priority.  As part of our continuing mission to provide you with exceptional heart care, we have created designated Provider Care Teams.  These Care Teams include your primary Cardiologist (physician) and Advanced Practice Providers (APPs -  Physician Assistants and Nurse Practitioners) who all work together to provide you with the care you need, when you need it.  We recommend signing up for the patient portal called "MyChart".  Sign up information is provided on this After Visit Summary.  MyChart is used to connect with patients for Virtual Visits (Telemedicine).  Patients are able to view lab/test results, encounter notes, upcoming appointments, etc.  Non-urgent messages can be sent to your provider as well.   To learn more about what you can do with MyChart, go to [email or web address].    Your next appointment:   Structural Team will follow-up: 7-day, 45 day, 6 months, 1 year, 2 years  The format for your next appointment:   In Person  Provider:   Legrand Como Cooper/Structural Team    Other Instructions Watchman procedure instruction letter (separate)  Important Information About  [place]

## 2022-06-14 LAB — BASIC METABOLIC PANEL
BUN/Creatinine Ratio: 14 (ref 10–24)
BUN: 15 mg/dL (ref 8–27)
CO2: 24 mmol/L (ref 20–29)
Calcium: 9.5 mg/dL (ref 8.6–10.2)
Chloride: 98 mmol/L (ref 96–106)
Creatinine, Ser: 1.1 mg/dL (ref 0.76–1.27)
Glucose: 134 mg/dL — ABNORMAL HIGH (ref 70–99)
Potassium: 4.9 mmol/L (ref 3.5–5.2)
Sodium: 140 mmol/L (ref 134–144)
eGFR: 68 mL/min/{1.73_m2} (ref >59)

## 2022-06-15 ENCOUNTER — Telehealth: Payer: Self-pay

## 2022-06-15 NOTE — Telephone Encounter (Signed)
-----   Message from Irving Copas., MD sent at 06/15/2022  3:11 AM EDT ----- Regarding: RE: EUS [place] [place], Patient can be offered enteroscopy/EUS with miniprobe in the coming months and most certainly should occur after he has healed up from his Watchman procedure.   Please put him down for EUS in the next 2 to 3 months.  DCM and CLC, Patient has had a negative CT scan and with the location of this lesion being in the distal duodenum/jejunal transition, our ability to use our typical echoendoscopes to visualize the area will be quite difficult.  Tissue acquisition will also be quite difficult if possible at all.  A potential use of enteroscopy and miniprobe EUS could be considered to see if we can better define the lesion based on the wall layers but again would not be the easiest or most simple.  This may be something that is just monitored. Thanks. GM ----- Message ----- From: Timothy Lasso, RN Sent: 06/11/2022  11:41 AM EDT To: Irving Copas., MD Subject: FW: EUS                                         ----- Message ----- From: Worthy Keeler Sent: 06/11/2022  11:34 AM EDT To: Timothy Lasso, RN; # Subject: EUS                                            Per Dr Jenetta Downer patient needs EUS for Dx: distal duodenal submucosal nodule. Thanks

## 2022-06-15 NOTE — Telephone Encounter (Signed)
Recall has been entered for 3 month EUS

## 2022-06-19 ENCOUNTER — Telehealth: Payer: Self-pay

## 2022-06-19 NOTE — Telephone Encounter (Signed)
-----   Message from Harvel Quale, MD sent at 06/15/2022  4:23 PM EDT ----- Regarding: RE: EUS Thanks Valarie Merino, I agree hopefully can be reached an evaluated but if not we may need to just keep an eye on it. Thanks again! ----- Message ----- From: Irving Copas., MD Sent: 06/15/2022   3:17 AM EDT To: Timothy Lasso, RN; Gabriel Rung, NP; # Subject: RE: EUS                                        Aleigh Grunden, Patient can be offered enteroscopy/EUS with miniprobe in the coming months and most certainly should occur after he has healed up from his Watchman procedure.   Please put him down for EUS in the next 2 to 3 months.  DCM and CLC, Patient has had a negative CT scan and with the location of this lesion being in the distal duodenum/jejunal transition, our ability to use our typical echoendoscopes to visualize the area will be quite difficult.  Tissue acquisition will also be quite difficult if possible at all.  A potential use of enteroscopy and miniprobe EUS could be considered to see if we can better define the lesion based on the wall layers but again would not be the easiest or most simple.  This may be something that is just monitored. Thanks. GM ----- Message ----- From: Timothy Lasso, RN Sent: 06/11/2022  11:41 AM EDT To: Irving Copas., MD Subject: FW: EUS                                         ----- Message ----- From: Worthy Keeler Sent: 06/11/2022  11:34 AM EDT To: Timothy Lasso, RN; # Subject: EUS                                            Per Dr Jenetta Downer patient needs EUS for Dx: distal duodenal submucosal nodule. Thanks

## 2022-06-26 ENCOUNTER — Telehealth: Payer: Self-pay

## 2022-06-26 NOTE — Telephone Encounter (Signed)
Confirmed procedure date of 06/28/2022. Confirmed new arrival time of 8:00AM for procedure time at 10:30AM. Reviewed pre-procedure instructions with patient. He will hold Eliquis the night before and morning of procedure (since his procedure is in the AM).  Per preference, he will hold his Lantus the night before the procedure. The morning of the procedure, he will only take carvedilol (he takes amiofdarone at night). The patient understands to call if questions/concerns arise prior to procedure. He was grateful for call and agrees with plan.

## 2022-06-27 ENCOUNTER — Other Ambulatory Visit: Payer: Self-pay

## 2022-06-27 DIAGNOSIS — I480 Paroxysmal atrial fibrillation: Secondary | ICD-10-CM

## 2022-06-28 ENCOUNTER — Encounter (HOSPITAL_COMMUNITY): Payer: Self-pay | Admitting: Cardiovascular Disease

## 2022-06-28 ENCOUNTER — Inpatient Hospital Stay (HOSPITAL_COMMUNITY): Payer: Medicare Other

## 2022-06-28 ENCOUNTER — Other Ambulatory Visit: Payer: Self-pay

## 2022-06-28 ENCOUNTER — Encounter (HOSPITAL_COMMUNITY)
Admission: RE | Disposition: A | Discharge status: Home / Self Care | Payer: Self-pay | Source: Home / Self Care | Attending: Cardiovascular Disease

## 2022-06-28 ENCOUNTER — Inpatient Hospital Stay (HOSPITAL_COMMUNITY): Payer: Medicare Other | Admitting: Anesthesiology

## 2022-06-28 ENCOUNTER — Inpatient Hospital Stay (HOSPITAL_COMMUNITY)
Admission: RE | Admit: 2022-06-28 | Discharge: 2022-06-29 | DRG: 274 | Disposition: A | Discharge status: Home / Self Care | Payer: Medicare Other | Attending: Cardiovascular Disease | Admitting: Cardiovascular Disease

## 2022-06-28 DIAGNOSIS — Z9049 Acquired absence of other specified parts of digestive tract: Secondary | ICD-10-CM | POA: Diagnosis not present

## 2022-06-28 DIAGNOSIS — E119 Type 2 diabetes mellitus without complications: Secondary | ICD-10-CM

## 2022-06-28 DIAGNOSIS — Z9079 Acquired absence of other genital organ(s): Secondary | ICD-10-CM

## 2022-06-28 DIAGNOSIS — I10 Essential (primary) hypertension: Secondary | ICD-10-CM | POA: Diagnosis present

## 2022-06-28 DIAGNOSIS — I2510 Atherosclerotic heart disease of native coronary artery without angina pectoris: Secondary | ICD-10-CM | POA: Diagnosis present

## 2022-06-28 DIAGNOSIS — G4733 Obstructive sleep apnea (adult) (pediatric): Secondary | ICD-10-CM | POA: Diagnosis present

## 2022-06-28 DIAGNOSIS — Z87891 Personal history of nicotine dependence: Secondary | ICD-10-CM

## 2022-06-28 DIAGNOSIS — M109 Gout, unspecified: Secondary | ICD-10-CM | POA: Diagnosis present

## 2022-06-28 DIAGNOSIS — I4819 Other persistent atrial fibrillation: Secondary | ICD-10-CM

## 2022-06-28 DIAGNOSIS — I5043 Acute on chronic combined systolic (congestive) and diastolic (congestive) heart failure: Secondary | ICD-10-CM | POA: Diagnosis not present

## 2022-06-28 DIAGNOSIS — I5032 Chronic diastolic (congestive) heart failure: Secondary | ICD-10-CM | POA: Diagnosis present

## 2022-06-28 DIAGNOSIS — Z8546 Personal history of malignant neoplasm of prostate: Secondary | ICD-10-CM | POA: Diagnosis not present

## 2022-06-28 DIAGNOSIS — Z95818 Presence of other cardiac implants and grafts: Secondary | ICD-10-CM

## 2022-06-28 DIAGNOSIS — Z006 Encounter for examination for normal comparison and control in clinical research program: Secondary | ICD-10-CM

## 2022-06-28 DIAGNOSIS — I252 Old myocardial infarction: Secondary | ICD-10-CM

## 2022-06-28 DIAGNOSIS — Z794 Long term (current) use of insulin: Secondary | ICD-10-CM

## 2022-06-28 DIAGNOSIS — I428 Other cardiomyopathies: Secondary | ICD-10-CM | POA: Diagnosis present

## 2022-06-28 DIAGNOSIS — H401130 Primary open-angle glaucoma, bilateral, stage unspecified: Secondary | ICD-10-CM | POA: Diagnosis present

## 2022-06-28 DIAGNOSIS — I4891 Unspecified atrial fibrillation: Secondary | ICD-10-CM

## 2022-06-28 DIAGNOSIS — Z85820 Personal history of malignant melanoma of skin: Secondary | ICD-10-CM

## 2022-06-28 DIAGNOSIS — E785 Hyperlipidemia, unspecified: Secondary | ICD-10-CM | POA: Diagnosis present

## 2022-06-28 DIAGNOSIS — I110 Hypertensive heart disease with heart failure: Secondary | ICD-10-CM | POA: Diagnosis present

## 2022-06-28 DIAGNOSIS — Z7984 Long term (current) use of oral hypoglycemic drugs: Secondary | ICD-10-CM

## 2022-06-28 DIAGNOSIS — M1990 Unspecified osteoarthritis, unspecified site: Secondary | ICD-10-CM | POA: Diagnosis present

## 2022-06-28 DIAGNOSIS — Z7902 Long term (current) use of antithrombotics/antiplatelets: Secondary | ICD-10-CM | POA: Diagnosis not present

## 2022-06-28 DIAGNOSIS — Z833 Family history of diabetes mellitus: Secondary | ICD-10-CM | POA: Diagnosis not present

## 2022-06-28 DIAGNOSIS — D6869 Other thrombophilia: Secondary | ICD-10-CM | POA: Diagnosis present

## 2022-06-28 DIAGNOSIS — Z8249 Family history of ischemic heart disease and other diseases of the circulatory system: Secondary | ICD-10-CM

## 2022-06-28 DIAGNOSIS — Z7985 Long-term (current) use of injectable non-insulin antidiabetic drugs: Secondary | ICD-10-CM | POA: Diagnosis not present

## 2022-06-28 DIAGNOSIS — Z79899 Other long term (current) drug therapy: Secondary | ICD-10-CM

## 2022-06-28 DIAGNOSIS — Z7901 Long term (current) use of anticoagulants: Secondary | ICD-10-CM

## 2022-06-28 DIAGNOSIS — E66812 Obesity, class 2: Secondary | ICD-10-CM | POA: Diagnosis present

## 2022-06-28 DIAGNOSIS — E113293 Type 2 diabetes mellitus with mild nonproliferative diabetic retinopathy without macular edema, bilateral: Secondary | ICD-10-CM | POA: Diagnosis present

## 2022-06-28 DIAGNOSIS — I480 Paroxysmal atrial fibrillation: Principal | ICD-10-CM | POA: Diagnosis present

## 2022-06-28 DIAGNOSIS — E1169 Type 2 diabetes mellitus with other specified complication: Secondary | ICD-10-CM

## 2022-06-28 DIAGNOSIS — I25119 Atherosclerotic heart disease of native coronary artery with unspecified angina pectoris: Secondary | ICD-10-CM | POA: Diagnosis present

## 2022-06-28 HISTORY — PX: LEFT ATRIAL APPENDAGE OCCLUSION: EP1229

## 2022-06-28 HISTORY — PX: TEE WITHOUT CARDIOVERSION: SHX5443

## 2022-06-28 LAB — TYPE AND SCREEN
ABO/RH(D): A POS
Antibody Screen: NEGATIVE

## 2022-06-28 LAB — CBC
HCT: 34.9 % — ABNORMAL LOW (ref 39.0–52.0)
Hemoglobin: 10.9 g/dL — ABNORMAL LOW (ref 13.0–17.0)
MCH: 27.7 pg (ref 26.0–34.0)
MCHC: 31.2 g/dL (ref 30.0–36.0)
MCV: 88.8 fL (ref 80.0–100.0)
Platelets: 214 10*3/uL (ref 150–400)
RBC: 3.93 MIL/uL — ABNORMAL LOW (ref 4.22–5.81)
RDW: 23.4 % — ABNORMAL HIGH (ref 11.5–15.5)
WBC: 7.1 10*3/uL (ref 4.0–10.5)
nRBC: 0 % (ref 0.0–0.2)

## 2022-06-28 LAB — BASIC METABOLIC PANEL
Anion gap: 14 (ref 5–15)
BUN: 23 mg/dL (ref 8–23)
CO2: 26 mmol/L (ref 22–32)
Calcium: 9.3 mg/dL (ref 8.9–10.3)
Chloride: 100 mmol/L (ref 98–111)
Creatinine, Ser: 1.15 mg/dL (ref 0.61–1.24)
GFR, Estimated: >60 mL/min (ref >60)
Glucose, Bld: 198 mg/dL — ABNORMAL HIGH (ref 70–99)
Potassium: 3.8 mmol/L (ref 3.5–5.1)
Sodium: 140 mmol/L (ref 135–145)

## 2022-06-28 LAB — SURGICAL PCR SCREEN
MRSA, PCR: NEGATIVE
Staphylococcus aureus: NEGATIVE

## 2022-06-28 LAB — GLUCOSE, CAPILLARY
Glucose-Capillary: 195 mg/dL — ABNORMAL HIGH (ref 70–99)
Glucose-Capillary: 174 mg/dL — ABNORMAL HIGH (ref 70–99)
Glucose-Capillary: 163 mg/dL — ABNORMAL HIGH (ref 70–99)
Glucose-Capillary: 169 mg/dL — ABNORMAL HIGH (ref 70–99)
Glucose-Capillary: 285 mg/dL — ABNORMAL HIGH (ref 70–99)

## 2022-06-28 SURGERY — LEFT ATRIAL APPENDAGE OCCLUSION
Anesthesia: General

## 2022-06-28 MED ORDER — EPHEDRINE SULFATE-NACL 50-0.9 MG/10ML-% IV SOSY
PREFILLED_SYRINGE | INTRAVENOUS | Status: DC | PRN
  Administered 2022-06-28: 2.5 mg via INTRAVENOUS

## 2022-06-28 MED ORDER — DEXAMETHASONE SODIUM PHOSPHATE 10 MG/ML IJ SOLN
INTRAMUSCULAR | Status: DC | PRN
  Administered 2022-06-28: 5 mg via INTRAVENOUS

## 2022-06-28 MED ORDER — PHENYLEPHRINE HCL-NACL 20-0.9 MG/250ML-% IV SOLN
INTRAVENOUS | Status: DC | PRN
  Administered 2022-06-28: 20 ug/min via INTRAVENOUS

## 2022-06-28 MED ORDER — SODIUM CHLORIDE 0.9% FLUSH
3.0000 mL | Freq: Two times a day (BID) | INTRAVENOUS | Status: DC
  Administered 2022-06-28: 3 mL via INTRAVENOUS

## 2022-06-28 MED ORDER — CEFAZOLIN SODIUM-DEXTROSE 2-4 GM/100ML-% IV SOLN
2.0000 g | INTRAVENOUS | Status: AC
  Administered 2022-06-28: 2 g via INTRAVENOUS

## 2022-06-28 MED ORDER — GEMFIBROZIL 600 MG PO TABS
600.0000 mg | ORAL_TABLET | Freq: Every day | ORAL | Status: DC
  Administered 2022-06-28: 600 mg via ORAL
  Filled 2022-06-28: qty 1

## 2022-06-28 MED ORDER — HEPARIN SODIUM (PORCINE) 5000 UNIT/ML IJ SOLN: 5000.0000 [IU] | Freq: Three times a day (TID) | INTRAMUSCULAR | Status: DC

## 2022-06-28 MED ORDER — FENTANYL CITRATE (PF) 100 MCG/2ML IJ SOLN
INTRAMUSCULAR | Status: DC | PRN
  Administered 2022-06-28: 100 ug via INTRAVENOUS

## 2022-06-28 MED ORDER — TORSEMIDE 20 MG PO TABS
20.0000 mg | ORAL_TABLET | Freq: Two times a day (BID) | ORAL | Status: DC
  Administered 2022-06-28 – 2022-06-29 (×2): 20 mg via ORAL
  Filled 2022-06-28 (×2): qty 1

## 2022-06-28 MED ORDER — IOHEXOL 350 MG/ML SOLN
INTRAVENOUS | Status: DC | PRN
  Administered 2022-06-28 (×2): 5 mL

## 2022-06-28 MED ORDER — HEPARIN (PORCINE) IN NACL 1000-0.9 UT/500ML-% IV SOLN
INTRAVENOUS | Status: DC | PRN
  Administered 2022-06-28: 500 mL

## 2022-06-28 MED ORDER — AMIODARONE HCL 200 MG PO TABS
200.0000 mg | ORAL_TABLET | Freq: Every day | ORAL | Status: DC
  Administered 2022-06-28 – 2022-06-29 (×2): 200 mg via ORAL
  Filled 2022-06-28 (×3): qty 1

## 2022-06-28 MED ORDER — PHENYLEPHRINE 80 MCG/ML (10ML) SYRINGE FOR IV PUSH (FOR BLOOD PRESSURE SUPPORT)
PREFILLED_SYRINGE | INTRAVENOUS | Status: DC | PRN
  Administered 2022-06-28 (×2): 80 ug via INTRAVENOUS

## 2022-06-28 MED ORDER — SODIUM CHLORIDE 0.9 % IV SOLN: 250.0000 mL | INTRAVENOUS | Status: DC | PRN

## 2022-06-28 MED ORDER — SODIUM CHLORIDE 0.9% FLUSH: 3.0000 mL | INTRAVENOUS | Status: DC | PRN

## 2022-06-28 MED ORDER — HEPARIN SODIUM (PORCINE) 1000 UNIT/ML IJ SOLN
INTRAMUSCULAR | Status: DC | PRN
  Administered 2022-06-28: 15000 [IU] via INTRAVENOUS
  Administered 2022-06-28: 3000 [IU] via INTRAVENOUS

## 2022-06-28 MED ORDER — PROPOFOL 10 MG/ML IV BOLUS
INTRAVENOUS | Status: DC | PRN
  Administered 2022-06-28: 100 mg via INTRAVENOUS

## 2022-06-28 MED ORDER — INSULIN ASPART 100 UNIT/ML IJ SOLN
INTRAMUSCULAR | Status: AC
  Filled 2022-06-28: qty 1

## 2022-06-28 MED ORDER — ACETAMINOPHEN 325 MG PO TABS: 650.0000 mg | ORAL_TABLET | ORAL | Status: DC | PRN

## 2022-06-28 MED ORDER — BRIMONIDINE TARTRATE-TIMOLOL 0.2-0.5 % OP SOLN: 1.0000 [drp] | Freq: Two times a day (BID) | OPHTHALMIC | Status: DC

## 2022-06-28 MED ORDER — SODIUM CHLORIDE 0.9 % IV SOLN: INTRAVENOUS | Status: DC

## 2022-06-28 MED ORDER — LIDOCAINE 2% (20 MG/ML) 5 ML SYRINGE
INTRAMUSCULAR | Status: DC | PRN
  Administered 2022-06-28: 60 mg via INTRAVENOUS

## 2022-06-28 MED ORDER — LACTATED RINGERS IV SOLN: INTRAVENOUS | Status: DC | PRN

## 2022-06-28 MED ORDER — APIXABAN 2.5 MG PO TABS
2.5000 mg | ORAL_TABLET | Freq: Two times a day (BID) | ORAL | Status: DC
  Administered 2022-06-28 – 2022-06-29 (×2): 2.5 mg via ORAL
  Filled 2022-06-28 (×2): qty 1

## 2022-06-28 MED ORDER — LISINOPRIL 20 MG PO TABS
20.0000 mg | ORAL_TABLET | Freq: Every day | ORAL | Status: DC
  Administered 2022-06-28 – 2022-06-29 (×2): 20 mg via ORAL
  Filled 2022-06-28 (×3): qty 1

## 2022-06-28 MED ORDER — CARVEDILOL 3.125 MG PO TABS
3.1250 mg | ORAL_TABLET | Freq: Two times a day (BID) | ORAL | Status: DC
  Administered 2022-06-28 – 2022-06-29 (×2): 3.125 mg via ORAL
  Filled 2022-06-28 (×4): qty 1

## 2022-06-28 MED ORDER — CHLORHEXIDINE GLUCONATE 0.12 % MT SOLN
OROMUCOSAL | Status: AC
  Administered 2022-06-28: 15 mL
  Filled 2022-06-28: qty 15

## 2022-06-28 MED ORDER — CEFAZOLIN SODIUM-DEXTROSE 2-4 GM/100ML-% IV SOLN
INTRAVENOUS | Status: AC
  Filled 2022-06-28: qty 100

## 2022-06-28 MED ORDER — HEPARIN (PORCINE) IN NACL 2000-0.9 UNIT/L-% IV SOLN
INTRAVENOUS | Status: DC | PRN
  Administered 2022-06-28: 1000 mL

## 2022-06-28 MED ORDER — ONDANSETRON HCL 4 MG/2ML IJ SOLN: 4.0000 mg | Freq: Four times a day (QID) | INTRAMUSCULAR | Status: DC | PRN

## 2022-06-28 MED ORDER — HEPARIN (PORCINE) IN NACL 1000-0.9 UT/500ML-% IV SOLN
INTRAVENOUS | Status: AC
  Filled 2022-06-28: qty 500

## 2022-06-28 MED ORDER — INSULIN ASPART 100 UNIT/ML IJ SOLN: 0.0000 [IU] | INTRAMUSCULAR | Status: DC | PRN

## 2022-06-28 MED ORDER — ROCURONIUM BROMIDE 10 MG/ML (PF) SYRINGE
PREFILLED_SYRINGE | INTRAVENOUS | Status: DC | PRN
  Administered 2022-06-28: 40 mg via INTRAVENOUS
  Administered 2022-06-28: 60 mg via INTRAVENOUS

## 2022-06-28 MED ORDER — ONDANSETRON HCL 4 MG/2ML IJ SOLN
INTRAMUSCULAR | Status: DC | PRN
  Administered 2022-06-28: 4 mg via INTRAVENOUS

## 2022-06-28 MED ORDER — DOCUSATE SODIUM 100 MG PO CAPS
100.0000 mg | ORAL_CAPSULE | Freq: Every day | ORAL | Status: DC
  Administered 2022-06-29: 100 mg via ORAL
  Filled 2022-06-28: qty 1

## 2022-06-28 MED ORDER — INSULIN ASPART 100 UNIT/ML IJ SOLN
0.0000 [IU] | Freq: Three times a day (TID) | INTRAMUSCULAR | Status: DC
  Administered 2022-06-28: 3 [IU] via SUBCUTANEOUS
  Administered 2022-06-29: 2 [IU] via SUBCUTANEOUS

## 2022-06-28 MED ORDER — FENTANYL CITRATE (PF) 100 MCG/2ML IJ SOLN
INTRAMUSCULAR | Status: AC
  Filled 2022-06-28: qty 2

## 2022-06-28 MED ORDER — BRIMONIDINE TARTRATE 0.15 % OP SOLN
1.0000 [drp] | Freq: Two times a day (BID) | OPHTHALMIC | Status: DC
  Administered 2022-06-28 – 2022-06-29 (×2): 1 [drp] via OPHTHALMIC
  Filled 2022-06-28: qty 5

## 2022-06-28 MED ORDER — SUGAMMADEX SODIUM 200 MG/2ML IV SOLN
INTRAVENOUS | Status: DC | PRN
  Administered 2022-06-28: 400 mg via INTRAVENOUS

## 2022-06-28 MED ORDER — LACTATED RINGERS IV SOLN: INTRAVENOUS | Status: DC

## 2022-06-28 MED ORDER — TIMOLOL MALEATE 0.5 % OP SOLN
1.0000 [drp] | Freq: Two times a day (BID) | OPHTHALMIC | Status: DC
  Administered 2022-06-28 – 2022-06-29 (×2): 1 [drp] via OPHTHALMIC
  Filled 2022-06-28: qty 5

## 2022-06-28 SURGICAL SUPPLY — 22 items
BLANKET WARM UNDERBOD FULL ACC (MISCELLANEOUS) ×1 IMPLANT
CATH INFINITI 5FR ANG PIGTAIL (CATHETERS) IMPLANT
CLOSURE PERCLOSE PROSTYLE (VASCULAR PRODUCTS) IMPLANT
DEVICE WATCHMAN FLX PROC (KITS) IMPLANT
DILATOR VESSEL 38 20CM 11FR (INTRODUCER) IMPLANT
KIT HEART LEFT (KITS) ×1 IMPLANT
KIT MICROPUNCTURE NIT STIFF (SHEATH) IMPLANT
KIT SHEA VERSACROSS LAAC CONNE (KITS) IMPLANT
MAT PREVALON FULL STRYKER (MISCELLANEOUS) IMPLANT
PACK CARDIAC CATHETERIZATION (CUSTOM PROCEDURE TRAY) ×1 IMPLANT
PAD DEFIB RADIO PHYSIO CONN (PAD) ×1 IMPLANT
SHEATH PERFORMER 16FR 30 (SHEATH) IMPLANT
SHEATH PINNACLE 8F 10CM (SHEATH) IMPLANT
SHEATH PROBE COVER 6X72 (BAG) ×1 IMPLANT
SHIELD RADPAD SCOOP 12X17 (MISCELLANEOUS) ×1 IMPLANT
SYS WATCHMAN FXD DBL (SHEATH) ×1
SYSTEM WATCHMAN FXD DBL (SHEATH) IMPLANT
TRANSDUCER W/STOPCOCK (MISCELLANEOUS) ×1 IMPLANT
TUBING CIL FLEX 10 FLL-RA (TUBING) ×1 IMPLANT
WATCHMAN FLX 31 (Prosthesis & Implant Heart) IMPLANT
WATCHMAN FLX PROCEDURE DEVICE (KITS) ×1 IMPLANT
WATCHMAN PROCED TRUSEAL ACCESS (SHEATH) IMPLANT

## 2022-06-28 NOTE — Progress Notes (Signed)
Patient refused CPAP for the night. Stated he wouldn't be able to tolerate either mask. RN aware.

## 2022-06-28 NOTE — Discharge Summary (Incomplete)
[facility]    Patient ID: Lucas Dudley,  MRN: [id number], DOB/AGE: Jan 09, 1941 81 y.o.  Admit date: 06/28/2022 Discharge date: 06/29/2022  Primary Care Physician: Lucas Evens, MD  Primary Cardiologist: Lucas Lesches, MD  Electrophysiologist: Lucas Grayer, MD  Primary Discharge Diagnosis:  Persistent Atrial Fibrillation Poor candidacy for long term anticoagulation due to h/o GI bleeding  Secondary Discharge Diagnosis:  -CAD with moderate non-obstructive disease  -Chronic diastolic CHF -HTN -HLD -OSA on CPAP -DM2  Procedures This Admission:  Transeptal Puncture Intra-procedural TEE which showed no LAA thrombus Left atrial appendage occlusive device placement on 06/28/22 by Dr. Burt Dudley.   Brief HPI: Lucas Dudley is a 81 y.o. male with a history of persistent atrial fibrillation, hx of GI bleeding, anemia, HTN, OSA on CPAP, and DM2 who was referred by Dr. Domenic Dudley for the evaluation of Lucas Dudley closure.   [name] has a known hx of persistent atrial fibrillation and he has been managed with a rhythm control strategy using amiodarone and anticoagulation with apixaban. He was recently diagnosed with anemia with hemoglobin 7.6 and was referred to the emergency department found to have acute anemia and underwent upper and lower endoscopy studies which showed no significant bleeding source. 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 He required multiple PRBC transfusions and his apixaban was held throughout his hospitalization. He improved clinically with diuresis and his hemoglobin ultimately stabilized. At discharge, his anticoagulation was held. He followed up in the clinic with Dr. Domenic Dudley who then referred him for consideration of left atrial appendage closure as an alternative to anticoagulation in the context of high recurrent bleeding risk. He was seen by Dr. Burt Dudley 06/13/22 and felt to be a good candidate for Watchman  implant.   He underwent CT imaging which showed anatomy suitable for implant and he was therefore scheduled 06/28/22 for his procedure.   Hospital Course:  The patient was admitted and underwent left atrial appendage occlusive device placement with a 79m Watchman FLX device.  He was monitored on telemetry overnight which demonstrated sinus.  Groin site has been stable without complication. The patient was examined and considered to be stable for discharge. Wound care and restrictions were reviewed with the patient. The patient has been scheduled for post procedure follow up with JKathyrn Drown NP in 1 month. He was started on low-dose apixaban 2.5 mg twice daily 5 days before the procedure and has tolerated this well. He was restarted on this the evening of his procedure. Plan for Eliquis 2.'5mg'$  BID at discharge and then transition to plavix monotherapy at 6 weeks for a total of 6 months out from procedure date. Will obtain CT imaging at 8 weeks post op.    Physical Exam: Vitals:   06/28/22 2000 06/29/22 0000 06/29/22 0330 06/29/22 0739  BP: (!) 155/79 (!) 142/51 (!) 141/69 (!) 144/63  Pulse: 74 70 65 62  Resp:  20 (!) 21 19  Temp:  98.4 F (36.9 C) 98.2 F (36.8 C) 98.3 F (36.8 C)  TempSrc:  Oral Oral Oral  SpO2:  92% 97% 97%  Weight:      Height:       General: Well developed, well nourished, NAD Skin: Warm, dry, intact  Head: Normocephalic, atraumatic, sclera non-icteric, no xanthomas, clear, moist mucus membranes. Neck: Negative for carotid bruits. No JVD Lungs:Clear to ausculation bilaterally. No wheezes, rales, or rhonchi. Breathing is unlabored. Cardiovascular: RRR with S1 S2. No murmurs,  rubs, gallops, or LV heave appreciated. Abdomen: Soft, non-tender, non-distended with normoactive bowel sounds. No hepatomegaly, No rebound/guarding. No obvious abdominal masses. MSK: Strength and tone appear normal for age. 5/5 in all extremities Extremities: No edema. No clubbing or cyanosis.  DP/PT pulses 2+ bilaterally Neuro: Alert and oriented. No focal deficits. No facial asymmetry. MAE spontaneously. Psych: Responds to questions appropriately with normal affect.    Labs: Lab Results  Component Value Date   WBC 7.1 06/28/2022   HGB 10.9 (L) 06/28/2022   HCT 34.9 (L) 06/28/2022   MCV 88.8 06/28/2022   PLT 214 06/28/2022    Recent Labs  Lab 06/28/22 0826  NA 140  K 3.8  CL 100  CO2 26  BUN 23  CREATININE 1.15  CALCIUM 9.3  GLUCOSE 198*    Discharge Medications:  Allergies as of 06/29/2022       Reactions   Azithromycin Nausea Only   Tape Rash, Other (See Comments)   Causes skin redness, Use paper tape only.        Medication List     TAKE these medications    albuterol 108 (90 Base) MCG/ACT inhaler Commonly known as: VENTOLIN HFA Inhale 2 puffs into the lungs every 4 (four) hours as needed for wheezing or shortness of breath.   amiodarone 200 MG tablet Commonly known as: PACERONE TAKE 1 TABLET BY MOUTH EVERY DAY   Anti-Itch lotion Generic drug: camphor-menthol Apply 1 Application topically daily as needed for itching.   apixaban 2.5 MG Tabs tablet Commonly known as: ELIQUIS *START 5 DAYS PRIOR TO PROCEDURE* Take 1 tablet by mouth twice daily What changed:  how much to take how to take this when to take this   Basaglar KwikPen 100 UNIT/ML Inject 35 Units into the skin at bedtime. What changed: how much to take   carvedilol 3.125 MG tablet Commonly known as: COREG Take 1 tablet (3.125 mg total) by mouth 2 (two) times daily. What changed: how much to take   Combigan 0.2-0.5 % ophthalmic solution Generic drug: brimonidine-timolol Apply 1 drop to eye 2 (two) times daily.   CRANBERRY PO Take 1 tablet by mouth daily.   diphenhydramine-acetaminophen 25-500 MG Tabs tablet Commonly known as: TYLENOL PM Take 2 tablets by mouth at bedtime.   docusate sodium 100 MG capsule Commonly known as: Colace Take 1 capsule (100 mg total) by  mouth daily.   ferrous sulfate 325 (65 FE) MG tablet Take 1 tablet (325 mg total) by mouth 2 (two) times daily with a meal.   gemfibrozil 600 MG tablet Commonly known as: LOPID Take 600 mg by mouth at bedtime.   glimepiride 2 MG tablet Commonly known as: AMARYL Take 2 mg by mouth at bedtime.   Klor-Con M20 20 MEQ tablet Generic drug: potassium chloride SA TAKE 1 TABLET BY MOUTH EVERY DAY   lisinopril 20 MG tablet Commonly known as: ZESTRIL Take 1 tablet (20 mg total) by mouth daily.   Melatonin 10 MG Tabs Take 10 mg by mouth at bedtime.   metFORMIN 500 MG 24 hr tablet Commonly known as: GLUCOPHAGE-XR Take 1,000 mg by mouth in the morning and at bedtime.   mineral oil liquid Take 15 mLs by mouth daily as needed for moderate constipation.   multivitamin with minerals tablet Take 1 tablet by mouth daily. Centrum men 50+   Omega-3 1000 MG Caps Take 1,000 mg by mouth daily.   Ozempic (1 MG/DOSE) 4 MG/3ML Sopn Generic drug: Semaglutide (1 MG/DOSE) Inject  1 mg into the skin once a week. Saturday   pantoprazole 40 MG tablet Commonly known as: Protonix Take 1 tablet (40 mg total) by mouth 2 (two) times daily.   torsemide 20 MG tablet Commonly known as: DEMADEX Take 1 tablet (20 mg total) by mouth 2 (two) times daily.   valACYclovir 1000 MG tablet Commonly known as: VALTREX Take 1,000 mg by mouth at bedtime.        Disposition:  Home    Follow-up Information     Tommie Raymond, NP Follow up on 08/01/2022.   Specialty: Cardiology Why: @ 1045am. Please arrive to your appointment at 10:30am. Contact information: [place] [address] [phone]                 Duration of Discharge Encounter: Greater than 30 minutes including physician time.  Signed, Angelena Form, PA-C  06/29/2022 9:20 AM   Patient seen, examined. Available data reviewed. Agree with findings, assessment, and plan as outlined by Nell Range,  PA-C.  The patient is independently interviewed and examined.  He is alert, oriented, in no distress.  HEENT is normal, lungs are clear bilaterally, heart is regular rate and rhythm no murmur gallop, abdomen soft nontender, his right groin site is clear with no hematoma or ecchymosis, there is no lower extremity edema.  The patient is done very well with watchman implantation and has had no complication.  He is medically stable for discharge today.  We will plan on discharging him on apixaban 2.5 mg twice daily through 6 weeks then transition him to clopidogrel 75 mg daily through 6 months.  He will have a follow-up gated cardiac CTA in 8 weeks for post implant assessment.  Sherren Mocha, M.D. 06/29/2022 10:01 AM

## 2022-06-28 NOTE — Anesthesia Procedure Notes (Signed)
Procedure Name: Intubation Date/Time: 06/28/2022 11:56 AM  Performed by: Lorie Phenix, CRNAPre-anesthesia Checklist: Patient identified, Emergency Drugs available, Suction available and Patient being monitored Patient Re-evaluated:Patient Re-evaluated prior to induction Oxygen Delivery Method: Circle system utilized Preoxygenation: Pre-oxygenation with 100% oxygen Induction Type: IV induction Ventilation: Mask ventilation without difficulty and Oral airway inserted - appropriate to patient size Laryngoscope Size: Mac and 4 Grade View: Grade I Tube type: Oral Tube size: 7.5 mm Number of attempts: 1 Airway Equipment and Method: Stylet Placement Confirmation: ETT inserted through vocal cords under direct vision, positive ETCO2 and breath sounds checked- equal and bilateral Secured at: 24 cm Tube secured with: Tape Dental Injury: Teeth and Oropharynx as per pre-operative assessment

## 2022-06-28 NOTE — Op Note (Signed)
  [facility]   MULTIDISCIPLINARY HEART VALVE TEAM  SURGEON: Sherren Mocha, MD    TEE:  Sanda Klein, MD  PREPROCEDURE DIAGNOSIS: 1. Persistent Atrial fibrillation 2. GI bleed   POSTPROCEDURE DIAGNOSIS:   1. Persistent Atrial fibrillation 2. GI bleed   PROCEDURES:  1. Transseptal puncture with RF energy 2. Transesophageal echocardiogram 3. Left atrial appendage occlusive device placement.  INTRODUCTION:  Lucas Dudley is a 81 y.o. male with a history of atrial fibrillation who presents for left atrial appendage occlusive device placement.  The patient was evaluated by Dr [name] who feels that he is a poor candidate for long term anticoagulation secondary to GI AVM's and GI bleeding.  The patient therefore presents today for watchman device placement.  DESCRIPTION OF PROCEDURE: Informed written consent was obtained, and the patient was brought to the hybrid catheterization lab in a fasting state. The patient received general anesthesia as outlined in the anesthesia report.  TEE was performed which revealed no pericardial effusion or LAA thrombus.  Measurements of the atrial appendage OS revealed maximal diameter of 25 mm and depth of 25 mm.  Using direct US guidance, an 16-French sheath was placed into the right common femoral vein.  [place] images are captured and digitally stored in the patient's chart. A Baylis Versacross transseptal sheath is advanced through the RCFV into the SVC.  Using fluroscopic guidance and TEE, transseptal puncture was performed which the puncture made in the inferior and posterior portion of the intraatrial septum.  Transseptal puncture was confirmed by LA pressure measurement and TEE under fluoroscopic guidance. Once transseptal access was achieved heparin was administered for adequate anticoagulation during the procedure.  The Versacross dilator was exchanged for a [employer] double curve 30F access system (lot [id number]). A 6 F  pigtail was introduced through the access guide and advanced under fluoroscopy and TEE guidance into the left atrial appendage.  An angiogram was then performed by hand injection of nonionic contrast into the left atrial appendage.  The pigtail and access guide were then advanced to the tip of the appendage.  The pigtail was then removed and exchanged for a [employer] Flex Device 31 mm (LOT #[id number]) device.  The device is released using standard technique. Good position of the device was noted.  1 recapture and repositioning was required to try to lessen the mitral shoulder.  PASS Criteria: Final position of the WATCHMAN device revealed that it was located at the LAA ostium.  Tug test was performed and adequate.  Measurements by TEE revealed compression of 17%.  Doppler revealed no leak.  Fluoroscopy with angiogram also revealed no leak.  The device was deployed into the LAA using a standard technique.     The access system was then removed from the body and the 16 French sheath was removed, fully deployed and the Perclose sutures with complete hemostasis at the completion of the procedure.  There were no early apparent complications.   CONCLUSIONS:  1.Successful implantation of a WATCHMAN left atrial appendage occlusive device   2. No early apparent complications.   Anticoagulation Recommendations: Apixaban 2.5 mg twice daily through 6 weeks, then clopidogrel 75 mg daily through 6 months.  Gated cardiac CTA for follow-up in 8 weeks.  Sherren Mocha MD 06/28/2022 3:38 PM

## 2022-06-28 NOTE — Progress Notes (Signed)
  [facility]   MULTIDISCIPLINARY HEART VALVE TEAM  Patient doing well s/p LAAO. He is hemodynamically stable. Groin site is stable. ECG with NSR and no high grade block. Plan for early ambulation after bedrest completed and hopeful discharge over the next 24 hours.   Kathyrn Drown NP-C Structural Heart Team  Pager: [phone] Phone: [phone]

## 2022-06-28 NOTE — Anesthesia Postprocedure Evaluation (Signed)
Anesthesia Post Note  Patient: Lucas Dudley  Procedure(s) Performed: LEFT ATRIAL APPENDAGE OCCLUSION TRANSESOPHAGEAL ECHOCARDIOGRAM (TEE)     Patient location during evaluation: Cath Lab Anesthesia Type: General Level of consciousness: awake Pain management: pain level controlled Vital Signs Assessment: post-procedure vital signs reviewed and stable Respiratory status: spontaneous breathing, nonlabored ventilation, respiratory function stable and patient connected to nasal cannula oxygen Cardiovascular status: blood pressure returned to baseline and stable Postop Assessment: no apparent nausea or vomiting Anesthetic complications: no   No notable events documented.  Last Vitals:  Vitals:   06/28/22 1502 06/28/22 1515  BP: (!) 152/66 (!) 142/69  Pulse: 63 63  Resp: 18 19  Temp:    SpO2: 95% 93%    Last Pain:  Vitals:   06/28/22 1500  TempSrc: Oral  PainSc:                  Natthew Marlatt P Quamir Willemsen

## 2022-06-28 NOTE — Transfer of Care (Signed)
Immediate Anesthesia Transfer of Care Note  Patient: Lucas Dudley  Procedure(s) Performed: LEFT ATRIAL APPENDAGE OCCLUSION TRANSESOPHAGEAL ECHOCARDIOGRAM (TEE)  Patient Location: Cath Lab  Anesthesia Type:General  Level of Consciousness: awake and alert   Airway & Oxygen Therapy: Patient Spontanous Breathing and Patient connected to face mask oxygen  Post-op Assessment: Report given to RN and Post -op Vital signs reviewed and stable  Post vital signs: Reviewed and stable  Last Vitals:  Vitals Value Taken Time  BP 167/61 06/28/22 1319  Temp 36.8 C 06/28/22 1315  Pulse 64 06/28/22 1323  Resp 17 06/28/22 1323  SpO2 98 % 06/28/22 1323  Vitals shown include unvalidated device data.  Last Pain:  Vitals:   06/28/22 1315  TempSrc: Temporal  PainSc: Asleep         Complications: No notable events documented.

## 2022-06-28 NOTE — Discharge Instructions (Signed)
[place] Procedure, Care After  Procedure MD: Dr. Armandina Stammer Clinical Coordinator: Lenice Llamas, RN  This sheet gives you information about how to care for yourself after your procedure. Your health care provider may also give you more specific instructions. If you have problems or questions, contact your health care provider.  What can I expect after the procedure? After the procedure, it is common to have: Bruising around your puncture site. Tenderness around your puncture site. Tiredness (fatigue).  Medication instructions It is very important to continue to take your blood thinner as directed by your doctor after the Watchman procedure. Call your procedure doctor's office with question or concerns. If you are on Coumadin (warfarin), you will have your INR checked the week after your procedure, with a goal INR of 2.0 - 3.0. Please follow your medication instructions on your discharge summary. Only take the medications listed on your discharge paperwork.  Follow up You will be seen in 1 month after your procedure You will have another CT approximately 8 weeks after your procedure mark to check your device You will follow up the MD/APP who performed your procedure 6 months after your procedure The Watchman Clinical Coordinator will check in with you from time to time, including 1 and 2 years after your procedure.    Follow these instructions at home: Puncture site care  Follow instructions from your health care provider about how to take care of your puncture site. Make sure you: If present, leave stitches (sutures), skin glue, or adhesive strips in place.  If a large square bandage is present, this may be removed 24 hours after surgery.  Check your puncture site every day for signs of infection. Check for: Redness, swelling, or pain. Fluid or blood. If your puncture site starts to bleed, lie down on your back, apply firm pressure to the area, and contact your health care  provider. Warmth. Pus or a bad smell. Driving Do not drive yourself home if you received sedation Do not drive for at least 4 days after your procedure or however long your health care provider recommends. (Do not resume driving if you have previously been instructed not to drive for other health reasons.) Do not spend greater than 1 hour at a time in a car for the first 3 days. Stop and take a break with a 5 minute walk at least every hour.  Do not drive or use heavy machinery while taking prescription pain medicine.  Activity Avoid activities that take a lot of effort, including exercise, for at least 7 days after your procedure. For the first 3 days, avoid sitting for longer than one hour at a time.  Avoid alcoholic beverages, signing paperwork, or participating in legal proceedings for 24 hours after receiving sedation Do not lift anything that is heavier than 10 lb (4.5 kg) for one week.  No sexual activity for 1 week.  Return to your normal activities as told by your health care provider. Ask your health care provider what activities are safe for you. General instructions Take over-the-counter and prescription medicines only as told by your health care provider. Do not use any products that contain nicotine or tobacco, such as cigarettes and e-cigarettes. If you need help quitting, ask your health care provider. You may shower after 24 hours, but Do not take baths, swim, or use a hot tub for 1 week.  Do not drink alcohol for 24 hours after your procedure. Keep all follow-up visits as told by your health care  provider. This is important. Dental Work: You will require antibiotics prior to any dental work, including cleanings, for 6 months after your Watchman implantation to help protect you from infection. After 6 months, antibiotics are no longer required. Contact a health care provider if: You have redness, mild swelling, or pain around your puncture site. You have soreness in your  throat or at your puncture site that does not improve after several days You have fluid or blood coming from your puncture site that stops after applying firm pressure to the area. Your puncture site feels warm to the touch. You have pus or a bad smell coming from your puncture site. You have a fever. You have chest pain or discomfort that spreads to your neck, jaw, or arm. You are sweating a lot. You feel nauseous. You have a fast or irregular heartbeat. You have shortness of breath. You are dizzy or light-headed and feel the need to lie down. You have pain or numbness in the arm or leg closest to your puncture site. Get help right away if: Your puncture site suddenly swells. Your puncture site is bleeding and the bleeding does not stop after applying firm pressure to the area. These symptoms may represent a serious problem that is an emergency. Do not wait to see if the symptoms will go away. Get medical help right away. Call your local emergency services (911 in the [place] Do not drive yourself to the hospital. Summary After the procedure, it is normal to have bruising and tenderness at the puncture site in your groin, neck, or forearm. Check your puncture site every day for signs of infection. Get help right away if your puncture site is bleeding and the bleeding does not stop after applying firm pressure to the area. This is a medical emergency.  This information is not intended to replace advice given to you by your health care provider. Make sure you discuss any questions you have with your health care provider.

## 2022-06-28 NOTE — Anesthesia Procedure Notes (Signed)
Arterial Line Insertion Start/End9/14/2023 10:20 AM Performed by: Trinna Post., CRNA, CRNA  Patient location: Pre-op. Preanesthetic checklist: patient identified, IV checked, site marked, risks and benefits discussed, surgical consent, monitors and equipment checked, pre-op evaluation, timeout performed and anesthesia consent Lidocaine 1% used for infiltration Right, radial was placed Catheter size: 20 G Hand hygiene performed  and maximum sterile barriers used   Attempts: 1 Procedure performed without using ultrasound guided technique. Following insertion, Biopatch and dressing applied. Post procedure assessment: normal  Patient tolerated the procedure well with no immediate complications.

## 2022-06-28 NOTE — TOC Progression Note (Signed)
Transition of Care [facility]) - Progression Note    Patient Details  Name: Lucas Dudley MRN: [id number] Date of Birth: Dec 06, 1940  Transition of Care [facility]) CM/SW [place], RN Phone Number:[phone]  06/28/2022, 3:34 PM  Clinical Narrative:     Transition of Care [facility]) Screening Note   Patient Details  Name: Lucas Dudley Date of Birth: 1940-11-27   Transition of Care [facility]) CM/SW Contact:    Angelita Ingles, RN Phone Number: 06/28/2022, 3:34 PM    Transition of Care Department [facility]) has reviewed patient and no TOC needs have been identified at this time. We will continue to monitor patient advancement through interdisciplinary progression rounds. If new patient transition needs arise, please place a TOC consult.          Expected Discharge Plan and Services                                                 Social Determinants of Health (SDOH) Interventions    Readmission Risk Interventions    06/01/2022   11:02 AM  Readmission Risk Prevention Plan  Transportation Screening Complete  PCP or Specialist Appt within 3-5 Days Complete  HRI or [place] Complete  Social Work Consult for [place] Planning/Counseling Complete  Palliative Care Screening Complete  Medication Review [employer]) Complete

## 2022-06-28 NOTE — Anesthesia Preprocedure Evaluation (Addendum)
Anesthesia Evaluation  Patient identified by MRN, date of birth, ID band Patient awake    Reviewed: Allergy & Precautions, NPO status , Patient's Chart, lab work & pertinent test results  Airway Mallampati: II  TM Distance: >3 FB Neck ROM: Full    Dental  (+) Edentulous Upper, Missing   Pulmonary sleep apnea and Continuous Positive Airway Pressure Ventilation , former smoker,    Pulmonary exam normal        Cardiovascular hypertension, Pt. on home beta blockers and Pt. on medications + CAD, + Past MI and +CHF  Normal cardiovascular exam Rhythm:Regular  ECHO: Left ventricular ejection fraction, by estimation, is 60 to 65%. The left ventricle has normal function. The left ventricle has no regional wall motion abnormalities. There is mild left ventricular hypertrophy. Left ventricular diastolic parameters were normal. The average left ventricular global longitudinal strain is -17.7 %. The global longitudinal strain is normal. 1. Right ventricular systolic function is low normal. The right ventricular size is normal. There is moderately elevated pulmonary artery systolic pressure. The estimated right ventricular systolic pressure is 37.9 mmHg. Left atrial size was mildly dilated. The mitral valve is grossly normal. Trivial mitral valve regurgitation. The aortic valve is tricuspid. Aortic valve regurgitation is not visualized. Aortic valve sclerosis/calcification is present, without any evidence of aortic stenosis. The inferior vena cava is normal in size with greater than 50% respiratory variability, suggesting right atrial pressure of 3 mmHg.   Neuro/Psych negative neurological ROS  negative psych ROS   GI/Hepatic Neg liver ROS, GERD  Medicated and Controlled,  Endo/Other  diabetes, Insulin Dependent, Oral Hypoglycemic Agents  Renal/GU Renal disease     Musculoskeletal  (+) Arthritis ,   Abdominal (+) + obese,   Peds   Hematology  (+) Blood dyscrasia, anemia ,   Anesthesia Other Findings Atrial fibrillation  Reproductive/Obstetrics                            Anesthesia Physical Anesthesia Plan  ASA: 4  Anesthesia Plan: General   Post-op Pain Management:    Induction: Intravenous  PONV Risk Score and Plan: 2 and Ondansetron, Dexamethasone and Treatment may vary due to age or medical condition  Airway Management Planned: Oral ETT  Additional Equipment: Arterial line and TEE  Intra-op Plan:   Post-operative Plan: Extubation in OR  Informed Consent: I have reviewed the patients History and Physical, chart, labs and discussed the procedure including the risks, benefits and alternatives for the proposed anesthesia with the patient or authorized representative who has indicated his/her understanding and acceptance.     Dental advisory given  Plan Discussed with: CRNA  Anesthesia Plan Comments: (TEE probe placement for cardiology )       Anesthesia Quick Evaluation

## 2022-06-28 NOTE — Interval H&P Note (Signed)
History and Physical Interval Note:  06/28/2022 9:57 AM  Lucas Dudley  has presented today for surgery, with the diagnosis of afib.  The various methods of treatment have been discussed with the patient and family. After consideration of risks, benefits and other options for treatment, the patient has consented to  Procedure(s): LEFT ATRIAL APPENDAGE OCCLUSION (N/A) TRANSESOPHAGEAL ECHOCARDIOGRAM (TEE) (N/A) as a surgical intervention.  The patient's history has been reviewed, patient examined, no change in status, stable for surgery.  I have reviewed the patient's chart and labs.  Questions were answered to the patient's satisfaction.     Sherren Mocha

## 2022-06-29 DIAGNOSIS — I4819 Other persistent atrial fibrillation: Secondary | ICD-10-CM | POA: Diagnosis not present

## 2022-06-29 LAB — GLUCOSE, CAPILLARY: Glucose-Capillary: 145 mg/dL — ABNORMAL HIGH (ref 70–99)

## 2022-06-29 NOTE — Progress Notes (Signed)
Rn removed PIVs and went over d/c summary pt. Belongings w/ pt. Nt transporting pt to d/c lounge where pt's daughter will pick him up

## 2022-07-02 ENCOUNTER — Encounter (HOSPITAL_COMMUNITY): Payer: Self-pay | Admitting: Cardiovascular Disease

## 2022-07-02 ENCOUNTER — Telehealth: Payer: Self-pay | Admitting: Physician Assistant

## 2022-07-02 LAB — POCT ACTIVATED CLOTTING TIME
Activated Clotting Time: 245 seconds
Activated Clotting Time: 245 seconds

## 2022-07-02 NOTE — Telephone Encounter (Signed)
  [facility]   Watchman Team  Contacted the patient regarding discharge from [facility] on 9/15  The patient understands to follow up with Kathyrn Drown NP on 10/18.  The patient understands discharge instructions? Yes  The patient understands medications and regimen? Yes   The patient reports groin site looks stable.  The patient understands to call with any questions or concerns prior to scheduled visit.

## 2022-07-04 ENCOUNTER — Other Ambulatory Visit (HOSPITAL_COMMUNITY): Payer: Self-pay | Admitting: Nurse Practitioner

## 2022-07-10 NOTE — Progress Notes (Signed)
[facility] Note  07/13/2022    CHIEF COMPLAINT Patient presents for Retina Follow Up  HISTORY OF PRESENT ILLNESS: Lucas Dudley is a 81 y.o. male who presents to the clinic today for:  HPI     Retina Follow Up   Patient presents with  Diabetic Retinopathy.  In both eyes.  Severity is moderate.  Duration of 4 weeks.  Since onset it is stable.  I, the attending physician,  performed the HPI with the patient and updated documentation appropriately.        Comments   PT here for 4 wk ret f/u NPDR OU. Pt state [facility] the same but has experienced an odd visual sensation in OS twice in the past 4 wks. Seems like multiple lines/haze that floats in then out.       Last edited by Bernarda Caffey, MD on 07/13/2022  2:48 PM.     Patient states   Referring physician: [name], MD [place],  [place] [address]  HISTORICAL INFORMATION:   Selected notes from the MEDICAL RECORD NUMBER Referred by Dr. Kathlen Mody for DEE   CURRENT MEDICATIONS: Current Outpatient Medications (Ophthalmic Drugs)  Medication Sig   COMBIGAN 0.2-0.5 % ophthalmic solution Apply 1 drop to eye 2 (two) times daily.   No current facility-administered medications for this visit. (Ophthalmic Drugs)   Current Outpatient Medications (Other)  Medication Sig   albuterol (VENTOLIN HFA) 108 (90 Base) MCG/ACT inhaler Inhale 2 puffs into the lungs every 4 (four) hours as needed for wheezing or shortness of breath.   amiodarone (PACERONE) 200 MG tablet TAKE 1 TABLET BY MOUTH EVERY DAY   apixaban (ELIQUIS) 2.5 MG TABS tablet *START 5 DAYS PRIOR TO PROCEDURE* Take 1 tablet by mouth twice daily (Patient taking differently: Take 2.5 mg by mouth 2 (two) times daily. *START 5 DAYS PRIOR TO PROCEDURE* Take 1 tablet by mouth twice daily)   camphor-menthol (ANTI-ITCH) lotion Apply 1 Application topically daily as needed for itching.   carvedilol (COREG) 3.125 MG tablet Take 1 tablet (3.125 mg  total) by mouth 2 (two) times daily. (Patient taking differently: Take 700 mg by mouth 2 (two) times daily.)   CRANBERRY PO Take 1 tablet by mouth daily.   diphenhydramine-acetaminophen (TYLENOL PM) 25-500 MG TABS tablet Take 2 tablets by mouth at bedtime.   docusate sodium (COLACE) 100 MG capsule Take 1 capsule (100 mg total) by mouth daily.   ferrous sulfate 325 (65 FE) MG tablet Take 1 tablet (325 mg total) by mouth 2 (two) times daily with a meal.   gemfibrozil (LOPID) 600 MG tablet Take 600 mg by mouth at bedtime.    glimepiride (AMARYL) 2 MG tablet Take 2 mg by mouth at bedtime.   Insulin Glargine (BASAGLAR KWIKPEN) 100 UNIT/ML Inject 35 Units into the skin at bedtime. (Patient taking differently: Inject 30 Units into the skin at bedtime.)   KLOR-CON M20 20 MEQ tablet TAKE 1 TABLET BY MOUTH EVERY DAY   lisinopril (ZESTRIL) 20 MG tablet Take 1 tablet (20 mg total) by mouth daily.   Melatonin 10 MG TABS Take 10 mg by mouth at bedtime.   metFORMIN (GLUCOPHAGE-XR) 500 MG 24 hr tablet Take 1,000 mg by mouth in the morning and at bedtime.   mineral oil liquid Take 15 mLs by mouth daily as needed for moderate constipation.   Multiple Vitamins-Minerals (MULTIVITAMIN WITH MINERALS) tablet Take 1 tablet by mouth daily. Centrum men 50+  Omega-3 1000 MG CAPS Take 1,000 mg by mouth daily.   OZEMPIC, 1 MG/DOSE, 4 MG/3ML SOPN Inject 1 mg into the skin once a week. Saturday   torsemide (DEMADEX) 20 MG tablet Take 1 tablet (20 mg total) by mouth 2 (two) times daily.   valACYclovir (VALTREX) 1000 MG tablet Take 1,000 mg by mouth at bedtime.   pantoprazole (PROTONIX) 40 MG tablet Take 1 tablet (40 mg total) by mouth 2 (two) times daily.   Current Facility-Administered Medications (Other)  Medication Route   0.9 %  sodium chloride infusion Intravenous   0.9 %  sodium chloride infusion Intravenous   REVIEW OF SYSTEMS: ROS   Positive for: Gastrointestinal, Endocrine, Cardiovascular, Eyes,  Respiratory Negative for: Constitutional, Neurological, Skin, Genitourinary, Musculoskeletal, HENT, Psychiatric, Allergic/Imm, Heme/Lymph Last edited by Kingsley Spittle, COT on 07/13/2022  1:36 PM.     ALLERGIES Allergies  Allergen Reactions   Azithromycin Nausea Only   Tape Rash and Other (See Comments)    Causes skin redness, Use paper tape only.   PAST MEDICAL HISTORY Past Medical History:  Diagnosis Date   Arthritis    Atrial fibrillation (HCC)    CAD (coronary artery disease)    a. Cath 03/17/15 showing 100% ostial D1, 50% prox LAD to mid LAD, 40% RPDA stenosis. Med rx. // Myoview 01/2020: EF 31 diffuse perfusion defect without reversibility (suspect artifact); reviewed with Dr. Aggie Cosier study felt to be low risk   Cataract    Mixed form OD   Chronic diastolic CHF (congestive heart failure) (HCC)    Diabetic retinopathy ([place])    NPDR OU   Essential hypertension    Glaucoma    POAG OU   History of gout    Hyperlipidemia    Hypertensive retinopathy    OU   Kidney stones    Melanoma of neck (HCC)    NICM (nonischemic cardiomyopathy) ([place])    OSA on CPAP 2012   Prostate cancer ([place])    Type II diabetes mellitus ([place])    Past Surgical History:  Procedure Laterality Date   ABDOMINAL HERNIA REPAIR     w/mesh   ATRIAL FIBRILLATION ABLATION N/A 06/06/2021   Procedure: ATRIAL FIBRILLATION ABLATION;  Surgeon: Thompson Grayer, MD;  Location: [facility];  Service: Cardiovascular;  Laterality: N/A;   BIOPSY  05/24/2022   Procedure: BIOPSY;  Surgeon: Daneil Dolin, MD;  [facility];  Service: Endoscopy;;   BIOPSY  05/30/2022   Procedure: BIOPSY;  Surgeon: Daneil Dolin, MD;  [facility];  Service: Endoscopy;;   CARDIAC CATHETERIZATION N/A 03/17/2015   Procedure: Left Heart Cath and Coronary Angiography;  Surgeon: Lorretta Harp, MD;  Location: [facility];  Service: Cardiovascular;  Laterality: N/A;   CARDIOVERSION N/A 08/09/2021    Procedure: CARDIOVERSION;  Surgeon: [name], MD;  Location: [facility];  Service: Cardiovascular;  Laterality: N/A;   CARDIOVERSION N/A 11/10/2021   Procedure: CARDIOVERSION;  Surgeon: Berniece Salines, DO;  Location: [facility];  Service: Cardiovascular;  Laterality: N/A;   carotid doppler  09/17/2008   rigt and left ICAs 0-49%;mildly  abnormal   CATARACT EXTRACTION Left 2020   Dr. Kathlen Mody   COLONOSCOPY N/A 05/16/2017   Procedure: COLONOSCOPY;  Surgeon: Rogene Houston, MD;  [facility];  Service: Endoscopy;  Laterality: N/A;  930   COLONOSCOPY WITH PROPOFOL N/A 05/24/2022   Procedure: COLONOSCOPY WITH PROPOFOL;  Surgeon: Daneil Dolin, MD;  [facility];  Service: Endoscopy;  Laterality: N/A;   DOPPLER ECHOCARDIOGRAPHY  05/25/2009   EF 50-55%,LA mildly dilated, LV function normal   ELECTROPHYSIOLOGIC STUDY N/A 04/05/2015   Procedure: Cardioversion;  Surgeon: Sanda Klein, MD;  Location: [facility];  Service: Cardiovascular;  Laterality: N/A;   ELECTROPHYSIOLOGIC STUDY N/A 09/06/2015   Procedure: Atrial Fibrillation Ablation;  Surgeon: Thompson Grayer, MD;  Location: [facility];  Service: Cardiovascular;  Laterality: N/A;   ELECTROPHYSIOLOGIC STUDY N/A 07/12/2016   redo afib ablation by Dr Rayann Heman   ENTEROSCOPY N/A 05/30/2022   Procedure: ENTEROSCOPY;  Surgeon: Daneil Dolin, MD;  [facility];  Service: Endoscopy;  Laterality: N/A;   ESOPHAGOGASTRODUODENOSCOPY (EGD) WITH PROPOFOL N/A 05/24/2022   Procedure: ESOPHAGOGASTRODUODENOSCOPY (EGD) WITH PROPOFOL;  Surgeon: Daneil Dolin, MD;  [facility];  Service: Endoscopy;  Laterality: N/A;   EXCISIONAL HEMORRHOIDECTOMY     "inside and out"   EYE SURGERY Left 2020   Cat Sx - Dr. Kathlen Mody   FINE NEEDLE ASPIRATION Right    knee; "drew ~ 1 quart off"   GIVENS CAPSULE STUDY N/A 05/25/2022   Procedure: GIVENS CAPSULE STUDY;  Surgeon: Eloise Harman, DO;  [facility];   Service: Endoscopy;  Laterality: N/A;   HERNIA REPAIR     LAPAROSCOPIC CHOLECYSTECTOMY     LEFT ATRIAL APPENDAGE OCCLUSION N/A 06/28/2022   Procedure: LEFT ATRIAL APPENDAGE OCCLUSION;  Surgeon: Sherren Mocha, MD;  Location: [facility];  Service: Cardiovascular;  Laterality: N/A;   MELANOMA EXCISION Right    "neck"   NM MYOCAR PERF WALL MOTION  02/21/2012   EF 61% ,EXERCISE 7 METS. exercise stopped due to wheezing and shortness of breathe   POLYPECTOMY  05/16/2017   Procedure: POLYPECTOMY;  Surgeon: Rogene Houston, MD;  [facility];  Service: Endoscopy;;  colon   POLYPECTOMY  05/24/2022   Procedure: POLYPECTOMY;  Surgeon: Daneil Dolin, MD;  [facility];  Service: Endoscopy;;   PROSTATECTOMY     SHOULDER OPEN ROTATOR CUFF REPAIR Right X 2   SUBMUCOSAL TATTOO INJECTION  05/30/2022   Procedure: SUBMUCOSAL TATTOO INJECTION;  Surgeon: Daneil Dolin, MD;  [facility];  Service: Endoscopy;;   TEE WITHOUT CARDIOVERSION N/A 09/05/2015   Procedure: TRANSESOPHAGEAL ECHOCARDIOGRAM (TEE);  Surgeon: Sueanne Margarita, MD;  [facility];  Service: Cardiovascular;  Laterality: N/A;   TEE WITHOUT CARDIOVERSION N/A 06/28/2022   Procedure: TRANSESOPHAGEAL ECHOCARDIOGRAM (TEE);  Surgeon: Sherren Mocha, MD;  Location: [facility];  Service: Cardiovascular;  Laterality: N/A;   FAMILY HISTORY Family History  Problem Relation Age of Onset   Heart disease Mother    Lung cancer Mother    Heart attack Mother 61   Diabetes Father    Heart disease Father    Stroke Brother    Healthy Daughter    Colon cancer Maternal Aunt        51s   SOCIAL HISTORY Social History   Tobacco Use   Smoking status: Former    Packs/day: 2.00    Years: 27.00    Total pack years: 54.00    Types: Cigarettes    Quit date: 1992    Years since quitting: 31.7   Smokeless tobacco: Never   Tobacco comments:    Former smoker 09/27/21  Vaping Use   Vaping Use: Never  used  Substance Use Topics   Alcohol use: No    Alcohol/week:  0.0 standard drinks of alcohol    Comment: "used to drink; stopped ~ 2008"   Drug use: No       OPHTHALMIC EXAM: Base Eye Exam     Visual Acuity (Snellen - Linear)       Right Left   Dist cc 20/60 +1 20/40 -1   Dist ph cc NI 20/30 +2    Correction: Glasses         Tonometry (Tonopen, 1:56 PM)       Right Left   Pressure 17 14         Pupils       Dark Light Shape React APD   Right 2 1 Round Brisk None   Left 2 1 Round Brisk None         Visual Fields (Counting fingers)       Left Right    Full Full         Extraocular Movement       Right Left    Full, Ortho Full, Ortho         Neuro/Psych     Oriented x3: Yes   Mood/Affect: Normal         Dilation     Both eyes: 1.0% Mydriacyl, 2.5% Phenylephrine @ 1:57 PM           Slit Lamp and Fundus Exam     Slit Lamp Exam       Right Left   Lids/Lashes Dermato, mild MGD Dermato, mild MGD   Conjunctiva/Sclera Temporal pinguecula, mild inferior sub conj heme Temporal pinguecula   Cornea EBMD, mild haze, trace PEE, mild Debris in tear film, well healed cataract wound trace haze, trace PEE, well healed temporal cataract wounds, arcus   Anterior Chamber Deep and clear; narrow temporal angle Deep and clear   Iris Round and dilated, mild anterior bowing, No NVI Round and moderately dilated to 5.20m   Lens PCIOL in good position, trace PCO PC IOL in good position with open PC   Anterior Vitreous Synerisis Synerisis         Fundus Exam       Right Left   Disc Mild pallor, sharp, +cupping, thin inferior rim Mild pallor, sharp rim, +cupping, +PPA   C/D Ratio 0.7 0.6   Macula Flat, blunted foveal reflex, cystic changes - increased, RPE mottling and clumping, no heme Flat, blunted foveal reflex, clusters of fine MA nasal and superior to fovea -- improved, +cystic changes -- persistent   Vessels attenuated, Tortuous Attenuated, mild av  crossing changes, mild tortuousity   Periphery Attached, rare MA Attached. No heme.           Refraction     Wearing Rx       Sphere Cylinder Axis Add   Right -0.25 +2.00 158 +2.50   Left -1.00 +1.25 008 +2.50         Manifest Refraction       Sphere Cylinder Axis Dist VA   Right -1.00 +1.75 175 20/60   Left               IMAGING AND PROCEDURES  Imaging and Procedures for 07/13/2022  OCT, Retina - OU - Both Eyes       Right Eye Quality was good. Central Foveal Thickness: 528. Progression has worsened. Findings include no SRF, abnormal foveal contour, intraretinal fluid, vitreomacular adhesion (interval increase in central IRF).   Left Eye Quality was good. Central Foveal Thickness: 317. Progression  has been stable. Findings include abnormal foveal contour, intraretinal hyper-reflective material, intraretinal fluid (Persistent IRF/IRHM; partial PVD).   Notes *Images captured and stored on drive  Diagnosis / Impression:  DME OU OD: interval increase in central IRF OS: Persistent IRF/IRHM; partial PVD  Clinical management:  See below  Abbreviations: NFP - Normal foveal profile. CME - cystoid macular edema. PED - pigment epithelial detachment. IRF - intraretinal fluid. SRF - subretinal fluid. EZ - ellipsoid zone. ERM - epiretinal membrane. ORA - outer retinal atrophy. ORT - outer retinal tubulation. SRHM - subretinal hyper-reflective material. IRHM - intraretinal hyper-reflective material     Intravitreal Injection, Pharmacologic Agent - OS - Left Eye       Time Out 07/13/2022. 2:04 PM. Confirmed correct patient, procedure, site, and patient consented.   Anesthesia Topical anesthesia was used. Anesthetic medications included Lidocaine 2%, Proparacaine 0.5%.   Procedure Preparation included 5% betadine to ocular surface, eyelid speculum. A (32g) needle was used.   Injection: 6 mg faricimab-svoa 6 MG/0.05ML   Route: Intravitreal, Site: Left Eye   NDC:  21975-883-25, Lot: [id number], Expiration date: 05/14/2024, Waste: 0 mL   Post-op Post injection exam found visual acuity of at least counting fingers. The patient tolerated the procedure well. There were no complications. The patient received written and verbal post procedure care education. Post injection medications were not given.      Intravitreal Injection, Pharmacologic Agent - OD - Right Eye       Time Out 07/13/2022. 2:15 PM. Confirmed correct patient, procedure, site, and patient consented.   Anesthesia Topical anesthesia was used. Anesthetic medications included Lidocaine 2%, Proparacaine 0.5%.   Procedure Preparation included 5% betadine to ocular surface, eyelid speculum. A (32g) needle was used.   Injection: 2 mg aflibercept 2 MG/0.05ML   Route: Intravitreal, Site: Right Eye   NDC: A3590391, Lot: [id number], Expiration date: 07/15/2023, Waste: 0 mL   Post-op Post injection exam found visual acuity of at least counting fingers. The patient tolerated the procedure well. There were no complications. The patient received written and verbal post procedure care education.            ASSESSMENT/PLAN:   ICD-10-CM   1. 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, associated with type 2 diabetes mellitus (HCC)  S81.1031 OCT, Retina - OU - Both Eyes    Intravitreal Injection, Pharmacologic Agent - OS - Left Eye    Intravitreal Injection, Pharmacologic Agent - OD - Right Eye    faricimab-svoa (VABYSMO) 61m/0.05mL intravitreal injection    aflibercept (EYLEA) SOLN 2 mg    2. Essential hypertension  I10     3. Hypertensive retinopathy of both eyes  H35.033     4. Pseudophakia of both eyes  Z96.1     5. 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 History of herpes zoster of eye  Z86.19     7. Primary open angle glaucoma of both eyes, unspecified glaucoma stage  H40.1130     8. PCO (posterior capsular  opacification), left  H26.492      1. 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 Single focal MA superior to fovea; OS: Perifoveal Mas w/ late leakage - s/p IVA OS # 1(07.26.21), #2 (08.24.21), #3 (09.21.21), #4 (10.22.21) -- IVA resistance - s/p IVE OS #1 (11.22.21), #2 (12.21.21), #3 (01.20.22), #4 (02.18.22), #5 (03.18.22), #6 (04.15.22), #7 (05.10.22) -- sample, #8 (06.08.22), #9 (  07.12.22), #10 (08.12.22), #11 (09.16.22), #12 (10.21.22), #13 (11.18.22), #14 (12.21.22), #15 (01.20.23), #16 (02.17.23), #17 (03.24.23), #18 (04.21.23), #19 (05.26.23), #20 (06.23.23) -- IVE resistance - s/p IVV OS #1 (07.21.23), #2 (08.25.23) - BCVA 20/60 OD (dec from 20/30); OS 20/30 (improved from 20/40)  - OCT shows OD: interval increase in central IRF; OS: Persistent IRF/IRHM; partial PVD at 5 weeks  - recommend starting injections in OD today due to increase in IRF and decreased BCVA  - recommend IVE OD #1 and IVV OS #3 today, 09.29.23 with f/u back to 4 weeks  - pt wishes to proceed with injections  - RBA of procedure discussed, questions answered  - Vabysmo informed consent obtained and signed, 07.21.23 (OS) - see procedure note - IVA informed consent obtained and signed, 07.26.21 (OS) - IVE informed consent obtained and signed, 04.21.23 (OS) - IVE informed consent obtained and signed, 09.29.23 (OD) - Good Days approved until 10/14/22 - [employer] approved Vabysmo - f/u 4 weeks -- DFE/OCT, possible injection  2,3. Hypertensive retinopathy OU - discussed importance of tight BP control - Continue to monitor   4. Pseudophakia OU  - s/p CE/IOL OS (2020, Dr. Kathlen Mody)             - s/p CE/IOL OD (2022, Dr. Kathlen Mody)  - s/p YAG cap OS (04.04.23) - BCVA 20/25 from 20/150  - IOLs in good position, doing well  - Continue to monitor   5. 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 Susa Simmonds in August 2022  - s/p SuperK OS on September 25, 2021  6. History of herpes  zoster/iritis OS  - on valtrex 1g daily---maintenance   7. POAG OU  - was under the expert management of Dr. Kathlen Mody   - IOP 10,11 today  - Continue Cosopt BID OU  Ophthalmic Meds Ordered this visit:  Meds ordered this encounter  Medications   faricimab-svoa (VABYSMO) 55m/0.05mL intravitreal injection   aflibercept (EYLEA) SOLN 2 mg     Return in about 4 weeks (around 08/10/2022) for f/u NPDR OU, DFE, OCT.  There are no Patient Instructions on file for this visit.  BGardiner Sleeper M.D., Ph.D. Diseases & Surgery of the Retina and Vitreous [facility] I have reviewed the above documentation for accuracy and completeness, and I agree with the above. BGardiner Sleeper M.D., Ph.D. 07/13/22 2:52 PM   Abbreviations: M myopia (nearsighted); A astigmatism; H hyperopia (farsighted); P presbyopia; Mrx spectacle prescription;  CTL contact lenses; OD right eye; OS left eye; OU both eyes  XT exotropia; ET esotropia; PEK punctate epithelial keratitis; PEE punctate epithelial erosions; DES dry eye syndrome; MGD meibomian gland dysfunction; ATs artificial tears; PFAT's preservative free artificial tears; NSt. Marynuclear sclerotic cataract; PSC posterior subcapsular cataract; ERM epi-retinal membrane; PVD posterior vitreous detachment; RD retinal detachment; DM diabetes mellitus; DR diabetic retinopathy; NPDR non-proliferative diabetic retinopathy; PDR proliferative diabetic retinopathy; CSME clinically significant macular edema; DME diabetic macular edema; dbh dot blot hemorrhages; CWS cotton wool spot; POAG primary open angle glaucoma; C/D cup-to-disc ratio; HVF humphrey visual field; GVF goldmann visual field; OCT optical coherence tomography; IOP intraocular pressure; BRVO Branch retinal vein occlusion; CRVO central retinal vein occlusion; CRAO central retinal artery occlusion; BRAO branch retinal artery occlusion; RT retinal tear; SB scleral buckle; PPV pars plana vitrectomy; VH  Vitreous hemorrhage; PRP panretinal laser photocoagulation; IVK intravitreal kenalog; VMT vitreomacular traction; MH Macular hole;  NVD neovascularization of the disc; NVE neovascularization  elsewhere; AREDS age related eye disease study; ARMD age related macular degeneration; POAG primary open angle glaucoma; EBMD epithelial/anterior basement membrane dystrophy; ACIOL anterior chamber intraocular lens; IOL intraocular lens; PCIOL posterior chamber intraocular lens; Phaco/IOL phacoemulsification with intraocular lens placement; PRK photorefractive keratectomy; LASIK laser assisted in situ keratomileusis; HTN hypertension; DM diabetes mellitus; COPD chronic obstructive pulmonary disease 

## 2022-07-13 ENCOUNTER — Encounter (INDEPENDENT_AMBULATORY_CARE_PROVIDER_SITE_OTHER): Payer: Self-pay | Admitting: Ophthalmology

## 2022-07-13 ENCOUNTER — Encounter (INDEPENDENT_AMBULATORY_CARE_PROVIDER_SITE_OTHER): Payer: Medicare Other | Admitting: Ophthalmology

## 2022-07-13 ENCOUNTER — Ambulatory Visit (INDEPENDENT_AMBULATORY_CARE_PROVIDER_SITE_OTHER): Payer: Medicare Other | Admitting: Ophthalmology

## 2022-07-13 DIAGNOSIS — Z8619 Personal history of other infectious and parasitic diseases: Secondary | ICD-10-CM

## 2022-07-13 DIAGNOSIS — E113213 Type 2 diabetes mellitus with mild nonproliferative diabetic retinopathy with macular edema, bilateral: Secondary | ICD-10-CM

## 2022-07-13 DIAGNOSIS — H35033 Hypertensive retinopathy, bilateral: Secondary | ICD-10-CM

## 2022-07-13 DIAGNOSIS — H18523 Epithelial (juvenile) corneal dystrophy, bilateral: Secondary | ICD-10-CM

## 2022-07-13 DIAGNOSIS — I10 Essential (primary) hypertension: Secondary | ICD-10-CM

## 2022-07-13 DIAGNOSIS — H26492 Other secondary cataract, left eye: Secondary | ICD-10-CM

## 2022-07-13 DIAGNOSIS — H401130 Primary open-angle glaucoma, bilateral, stage unspecified: Secondary | ICD-10-CM

## 2022-07-13 DIAGNOSIS — Z961 Presence of intraocular lens: Secondary | ICD-10-CM

## 2022-07-13 MED ORDER — AFLIBERCEPT 2MG/0.05ML IZ SOLN FOR KALEIDOSCOPE
2.0000 mg | INTRAVITREAL | Status: AC | PRN
  Administered 2022-07-13: 2 mg via INTRAVITREAL

## 2022-07-13 MED ORDER — FARICIMAB-SVOA 6 MG/0.05ML IZ SOLN
6.0000 mg | INTRAVITREAL | Status: AC | PRN
  Administered 2022-07-13: 6 mg via INTRAVITREAL

## 2022-07-17 ENCOUNTER — Other Ambulatory Visit: Payer: Self-pay | Admitting: Cardiology

## 2022-07-24 ENCOUNTER — Ambulatory Visit: Payer: Medicare Other | Attending: Cardiology | Admitting: Cardiology

## 2022-07-24 ENCOUNTER — Encounter: Payer: Self-pay | Admitting: Cardiology

## 2022-07-24 VITALS — BP 142/80 | HR 69 | Ht 70.0 in | Wt 238.8 lb

## 2022-07-24 DIAGNOSIS — D649 Anemia, unspecified: Secondary | ICD-10-CM | POA: Diagnosis not present

## 2022-07-24 DIAGNOSIS — I4819 Other persistent atrial fibrillation: Secondary | ICD-10-CM

## 2022-07-24 DIAGNOSIS — Z79899 Other long term (current) drug therapy: Secondary | ICD-10-CM

## 2022-07-24 DIAGNOSIS — I25119 Atherosclerotic heart disease of native coronary artery with unspecified angina pectoris: Secondary | ICD-10-CM

## 2022-07-24 MED ORDER — TORSEMIDE 20 MG PO TABS: 20.0000 mg | ORAL_TABLET | Freq: Two times a day (BID) | ORAL | 1 refills | Status: DC

## 2022-07-24 NOTE — Patient Instructions (Addendum)
Medication Instructions:  Your physician recommends that you continue on your current medications as directed. Please refer to the Current Medication list given to you today.  Labwork: CBC tomorrow at [facility]  Testing/Procedures: none  Follow-Up: Your physician recommends that you schedule a follow-up appointment in: 3 months  Any Other Special Instructions Will Be Listed Below (If Applicable).  If you need a refill on your cardiac medications before your next appointment, please call your pharmacy.

## 2022-07-24 NOTE — Progress Notes (Signed)
Cardiology Office Note  Date: 07/24/2022   ID: Lucas Dudley, DOB Apr 12, 1941, MRN [id number]  PCP:  [name], MD  Cardiologist:  Rozann Lesches, MD Electrophysiologist:  Thompson Grayer, MD   Chief Complaint  Patient presents with   Cardiac follow-up    History of Present Illness: Lucas Dudley is a 81 y.o. male last seen in August.  He had interval evaluation by Dr. [name] and underwent placement of Watchman device in September.  He is here today with his daughter for a follow-up visit.  States that he has been doing well, although today felt more short of breath taking out his trash can.  No palpitations.  He does not report any significant change in his stools necessarily.  He is on low-dose Eliquis temporarily following Watchman device implantation.  Last hemoglobin was 10.9 in September.  I reviewed his medications which are stable and outlined below.  No significant weight gain on current dose of Demadex.  I personally reviewed his ECG today which shows sinus rhythm with prolonged PR interval, leftward axis and decreased R wave progression.  Past Medical History:  Diagnosis Date   Arthritis    Atrial fibrillation (HCC)    CAD (coronary artery disease)    a. Cath 03/17/15 showing 100% ostial D1, 50% prox LAD to mid LAD, 40% RPDA stenosis. Med rx. // Myoview 01/2020: EF 31 diffuse perfusion defect without reversibility (suspect artifact); reviewed with Dr. Aggie Cosier study felt to be low risk   Cataract    Mixed form OD   Chronic diastolic CHF (congestive heart failure) (HCC)    Diabetic retinopathy ([place])    NPDR OU   Essential hypertension    Glaucoma    POAG OU   History of gout    Hyperlipidemia    Hypertensive retinopathy    OU   Kidney stones    Melanoma of neck (HCC)    NICM (nonischemic cardiomyopathy) ([place])    OSA on CPAP 2012   Prostate cancer ([place])    Type II diabetes mellitus ([place])     Past Surgical History:  Procedure Laterality Date    ABDOMINAL HERNIA REPAIR     w/mesh   ATRIAL FIBRILLATION ABLATION N/A 06/06/2021   Procedure: ATRIAL FIBRILLATION ABLATION;  Surgeon: Thompson Grayer, MD;  Location: [facility] [facility];  Service: Cardiovascular;  Laterality: N/A;   BIOPSY  05/24/2022   Procedure: BIOPSY;  Surgeon: Daneil Dolin, MD;  [facility];  Service: Endoscopy;;   BIOPSY  05/30/2022   Procedure: BIOPSY;  Surgeon: Daneil Dolin, MD;  [facility];  Service: Endoscopy;;   CARDIAC CATHETERIZATION N/A 03/17/2015   Procedure: Left Heart Cath and Coronary Angiography;  Surgeon: Lorretta Harp, MD;  Location: [facility];  Service: Cardiovascular;  Laterality: N/A;   CARDIOVERSION N/A 08/09/2021   Procedure: CARDIOVERSION;  Surgeon: [name], MD;  Location: [place];  Service: Cardiovascular;  Laterality: N/A;   CARDIOVERSION N/A 11/10/2021   Procedure: CARDIOVERSION;  Surgeon: Berniece Salines, DO;  Location: [place];  Service: Cardiovascular;  Laterality: N/A;   carotid doppler  09/17/2008   rigt and left ICAs 0-49%;mildly  abnormal   CATARACT EXTRACTION Left 2020   Dr. Kathlen Mody   COLONOSCOPY N/A 05/16/2017   Procedure: COLONOSCOPY;  Surgeon: Rogene Houston, MD;  [facility];  Service: Endoscopy;  Laterality: N/A;  930   COLONOSCOPY WITH PROPOFOL N/A 05/24/2022   Procedure: COLONOSCOPY WITH PROPOFOL;  Surgeon: Daneil Dolin, MD;  [facility];  Service: Endoscopy;  Laterality: N/A;   DOPPLER ECHOCARDIOGRAPHY  05/25/2009   EF 50-55%,LA mildly dilated, LV function normal   ELECTROPHYSIOLOGIC STUDY N/A 04/05/2015   Procedure: Cardioversion;  Surgeon: Sanda Klein, MD;  Location: [facility] [facility];  Service: Cardiovascular;  Laterality: N/A;   ELECTROPHYSIOLOGIC STUDY N/A 09/06/2015   Procedure: Atrial Fibrillation Ablation;  Surgeon: Thompson Grayer, MD;  Location: [facility];  Service: Cardiovascular;  Laterality: N/A;   ELECTROPHYSIOLOGIC STUDY N/A  07/12/2016   redo afib ablation by Dr Rayann Heman   ENTEROSCOPY N/A 05/30/2022   Procedure: ENTEROSCOPY;  Surgeon: Daneil Dolin, MD;  [facility];  Service: Endoscopy;  Laterality: N/A;   ESOPHAGOGASTRODUODENOSCOPY (EGD) WITH PROPOFOL N/A 05/24/2022   Procedure: ESOPHAGOGASTRODUODENOSCOPY (EGD) WITH PROPOFOL;  Surgeon: Daneil Dolin, MD;  [facility];  Service: Endoscopy;  Laterality: N/A;   EXCISIONAL HEMORRHOIDECTOMY     "inside and out"   EYE SURGERY Left 2020   Cat Sx - Dr. Kathlen Mody   FINE NEEDLE ASPIRATION Right    knee; "drew ~ 1 quart off"   GIVENS CAPSULE STUDY N/A 05/25/2022   Procedure: GIVENS CAPSULE STUDY;  Surgeon: Eloise Harman, DO;  [facility];  Service: Endoscopy;  Laterality: N/A;   HERNIA REPAIR     LAPAROSCOPIC CHOLECYSTECTOMY     LEFT ATRIAL APPENDAGE OCCLUSION N/A 06/28/2022   Procedure: LEFT ATRIAL APPENDAGE OCCLUSION;  Surgeon: Sherren Mocha, MD;  Location: [facility] [facility];  Service: Cardiovascular;  Laterality: N/A;   MELANOMA EXCISION Right    "neck"   NM MYOCAR PERF WALL MOTION  02/21/2012   EF 61% ,EXERCISE 7 METS. exercise stopped due to wheezing and shortness of breathe   POLYPECTOMY  05/16/2017   Procedure: POLYPECTOMY;  Surgeon: Rogene Houston, MD;  [facility];  Service: Endoscopy;;  colon   POLYPECTOMY  05/24/2022   Procedure: POLYPECTOMY;  Surgeon: Daneil Dolin, MD;  [facility];  Service: Endoscopy;;   PROSTATECTOMY     SHOULDER OPEN ROTATOR CUFF REPAIR Right X 2   SUBMUCOSAL TATTOO INJECTION  05/30/2022   Procedure: SUBMUCOSAL TATTOO INJECTION;  Surgeon: Daneil Dolin, MD;  [facility];  Service: Endoscopy;;   TEE WITHOUT CARDIOVERSION N/A 09/05/2015   Procedure: TRANSESOPHAGEAL ECHOCARDIOGRAM (TEE);  Surgeon: Sueanne Margarita, MD;  [facility];  Service: Cardiovascular;  Laterality: N/A;   TEE WITHOUT CARDIOVERSION N/A 06/28/2022   Procedure: TRANSESOPHAGEAL  ECHOCARDIOGRAM (TEE);  Surgeon: Sherren Mocha, MD;  Location: [place] [facility];  Service: Cardiovascular;  Laterality: N/A;    Current Outpatient Medications  Medication Sig Dispense Refill   albuterol (VENTOLIN HFA) 108 (90 Base) MCG/ACT inhaler Inhale 2 puffs into the lungs every 4 (four) hours as needed for wheezing or shortness of breath.     amiodarone (PACERONE) 200 MG tablet TAKE 1 TABLET BY MOUTH EVERY DAY 90 tablet 1   apixaban (ELIQUIS) 2.5 MG TABS tablet Take 2.5 mg by mouth 2 (two) times daily.     camphor-menthol (ANTI-ITCH) lotion Apply 1 Application topically daily as needed for itching.     carvedilol (COREG) 3.125 MG tablet Take 3.125 mg by mouth 2 (two) times daily with a meal.     COMBIGAN 0.2-0.5 % ophthalmic solution Apply 1 drop to eye 2 (two) times daily.     CRANBERRY PO Take 1 tablet by mouth daily.  diphenhydramine-acetaminophen (TYLENOL PM) 25-500 MG TABS tablet Take 2 tablets by mouth at bedtime.     docusate sodium (COLACE) 100 MG capsule Take 1 capsule (100 mg total) by mouth daily. 60 capsule 2   ferrous sulfate 325 (65 FE) MG tablet Take 1 tablet (325 mg total) by mouth 2 (two) times daily with a meal. 120 tablet 1   gemfibrozil (LOPID) 600 MG tablet Take 600 mg by mouth at bedtime.      glimepiride (AMARYL) 2 MG tablet Take 2 mg by mouth at bedtime.     Insulin Glargine (BASAGLAR KWIKPEN) 100 UNIT/ML Inject 32 Units into the skin daily.     KLOR-CON M20 20 MEQ tablet TAKE 1 TABLET BY MOUTH EVERY DAY 90 tablet 1   lisinopril (ZESTRIL) 20 MG tablet Take 1 tablet (20 mg total) by mouth daily. 90 tablet 1   Melatonin 10 MG TABS Take 10 mg by mouth at bedtime.     metFORMIN (GLUCOPHAGE-XR) 500 MG 24 hr tablet Take 1,000 mg by mouth in the morning and at bedtime.     mineral oil liquid Take 15 mLs by mouth daily as needed for moderate constipation.     Multiple Vitamins-Minerals (MULTIVITAMIN WITH MINERALS) tablet Take 1 tablet by mouth daily. Centrum men 50+      Omega-3 1000 MG CAPS Take 1,000 mg by mouth daily.     OZEMPIC, 1 MG/DOSE, 4 MG/3ML SOPN Inject 1 mg into the skin once a week. Saturday     pantoprazole (PROTONIX) 40 MG tablet Take 1 tablet (40 mg total) by mouth 2 (two) times daily. 60 tablet 0   valACYclovir (VALTREX) 1000 MG tablet Take 1,000 mg by mouth at bedtime.     torsemide (DEMADEX) 20 MG tablet Take 1 tablet (20 mg total) by mouth 2 (two) times daily. 180 tablet 1   Current Facility-Administered Medications  Medication Dose Route Frequency Provider Last Rate Last Admin   0.9 %  sodium chloride infusion   Intravenous Continuous Sherren Mocha, MD       0.9 %  sodium chloride infusion   Intravenous Continuous Sherren Mocha, MD       Allergies:  Azithromycin and Tape   ROS: No palpitations or syncope.  Physical Exam: VS:  BP (!) 142/80 (BP Location: Left Arm, Patient Position: Sitting, Cuff Size: Normal)   Pulse 69   Ht '5\' 10"'$  (1.778 m)   Wt 238 lb 12.8 oz (108.3 kg)   SpO2 92%   BMI 34.26 kg/m , BMI Body mass index is 34.26 kg/m.  Wt Readings from Last 3 Encounters:  07/24/22 238 lb 12.8 oz (108.3 kg)  06/28/22 236 lb (107 kg)  06/13/22 239 lb (108.4 kg)    General: Patient appears comfortable at rest. HEENT: Conjunctiva and lids normal. Neck: Supple, no elevated JVP or carotid bruits. Lungs: Clear to auscultation, nonlabored breathing at rest. Cardiac: Regular rate and rhythm, no S3, 1/6 systolic murmur. Abdomen: Soft, bowel sounds present. Extremities: Mild ankle edema.  ECG:  An ECG dated 06/29/2022 was personally reviewed today and demonstrated:  Sinus rhythm with prolonged PR interval, leftward axis, incomplete right bundle branch block.  Recent Labwork: 05/21/2022: ALT 14; AST 17; B Natriuretic Peptide 166.2; TSH 3.037 06/01/2022: Magnesium 1.6 06/28/2022: BUN 23; Creatinine, Ser 1.15; Hemoglobin 10.9; Platelets 214; Potassium 3.8; Sodium 140   Other Studies Reviewed Today:  TEE 06/28/2022:  1. Left  ventricular ejection fraction, by estimation, is 55 to 60%. The  left ventricle  has normal function. The left ventricle has no regional  wall motion abnormalities.   2. Right ventricular systolic function is normal. The right ventricular  size is normal. Tricuspid regurgitation signal is inadequate for assessing  PA pressure.   3. After the procedure there is a well-seated 31 mm Watchman FLX  appendage occlusion device, with adequate compression and no evidence of  peridevice leak. After the procedure there is a tiny atrial septal defect  in the fossa ovalis, with exclusively  left-to-right shunt. Left atrial size was mild to moderately dilated. No  left atrial/left atrial appendage thrombus was detected.   4. Right atrial size was mildly dilated.   5. The mitral valve is normal in structure. Mild mitral valve  regurgitation.   6. The aortic valve is tricuspid. Aortic valve regurgitation is not  visualized. No aortic stenosis is present.   7. There is mild (Grade II) layered plaque.  Assessment and Plan:  1.  Persistent atrial fibrillation with CHA2DS2-VASc score of 5 and significant risk of bleeding in light of known history of recurrent GI bleed and multiple small bowel AVMs.  He is now status post Watchman device implantation by Dr. [name], temporarily on Eliquis 2.5 mg twice daily.  He is in sinus rhythm today.  Continue amiodarone.  Check CBC to reassess hemoglobin in light of his recent shortness of breath.  He has follow-up in the structural heart clinic soon.  2.  CAD with moderate LAD disease and branch vessel disease that is being managed medically.  No angina at this time.  He is on Coreg and Lopid at this time.  3.  Essential hypertension, plan to continue Coreg and lisinopril.  4.  Intermittent leg edema, better off Norvasc and also continues on Demadex.  Medication Adjustments/Labs and Tests Ordered: Current medicines are reviewed at length with the patient today.   Concerns regarding medicines are outlined above.   Tests Ordered: Orders Placed This Encounter  Procedures   CBC   EKG 12-Lead    Medication Changes: Meds ordered this encounter  Medications   torsemide (DEMADEX) 20 MG tablet    Sig: Take 1 tablet (20 mg total) by mouth 2 (two) times daily.    Dispense:  180 tablet    Refill:  1    Disposition:  Follow up  3 months.  Signed, Satira Sark, MD, [facility] 07/24/2022 3:21 PM    [facility] [place], [place] [place], [place] [address] Phone: [phone]; Fax: [phone]

## 2022-07-25 ENCOUNTER — Other Ambulatory Visit: Payer: Self-pay | Admitting: Cardiology

## 2022-07-25 LAB — CBC WITH DIFFERENTIAL/PLATELET
Absolute Monocytes: 703 cells/uL (ref 200–950)
Basophils Absolute: 45 cells/uL (ref 0–200)
Basophils Relative: 0.5 %
Eosinophils Absolute: 240 cells/uL (ref 15–500)
Eosinophils Relative: 2.7 %
HCT: 32.6 % — ABNORMAL LOW (ref 38.5–50.0)
Hemoglobin: 10.5 g/dL — ABNORMAL LOW (ref 13.2–17.1)
Lymphs Abs: 908 cells/uL (ref 850–3900)
MCH: 29.6 pg (ref 27.0–33.0)
MCHC: 32.2 g/dL (ref 32.0–36.0)
MCV: 91.8 fL (ref 80.0–100.0)
MPV: 11.7 fL (ref 7.5–12.5)
Monocytes Relative: 7.9 %
Neutro Abs: 7004 cells/uL (ref 1500–7800)
Neutrophils Relative %: 78.7 %
Platelets: 272 10*3/uL (ref 140–400)
RBC: 3.55 10*6/uL — ABNORMAL LOW (ref 4.20–5.80)
RDW: 22.3 % — ABNORMAL HIGH (ref 11.0–15.0)
Total Lymphocyte: 10.2 %
WBC: 8.9 10*3/uL (ref 3.8–10.8)

## 2022-07-25 LAB — CBC MORPHOLOGY

## 2022-08-01 ENCOUNTER — Other Ambulatory Visit: Payer: Self-pay | Admitting: Cardiology

## 2022-08-01 ENCOUNTER — Encounter: Payer: Self-pay | Admitting: Cardiology

## 2022-08-01 ENCOUNTER — Other Ambulatory Visit: Payer: Self-pay

## 2022-08-01 ENCOUNTER — Ambulatory Visit: Payer: Medicare Other | Attending: Cardiology | Admitting: Cardiology

## 2022-08-01 VITALS — BP 144/64 | HR 52 | Ht 70.0 in | Wt 238.2 lb

## 2022-08-01 DIAGNOSIS — I4819 Other persistent atrial fibrillation: Secondary | ICD-10-CM

## 2022-08-01 DIAGNOSIS — I10 Essential (primary) hypertension: Secondary | ICD-10-CM

## 2022-08-01 DIAGNOSIS — I480 Paroxysmal atrial fibrillation: Secondary | ICD-10-CM

## 2022-08-01 DIAGNOSIS — I25119 Atherosclerotic heart disease of native coronary artery with unspecified angina pectoris: Secondary | ICD-10-CM

## 2022-08-01 DIAGNOSIS — Z79899 Other long term (current) drug therapy: Secondary | ICD-10-CM

## 2022-08-01 MED ORDER — CLOPIDOGREL BISULFATE 75 MG PO TABS: 75.0000 mg | ORAL_TABLET | Freq: Every day | ORAL | 3 refills | Status: DC

## 2022-08-01 NOTE — Patient Instructions (Signed)
Medication Instructions:  TAKE LAST DOSE OF ELIQUIS ON 10/28 PM START CLOPIDOGREL 75 MG DAILY ON 10/29 *If you need a refill on your cardiac medications before your next appointment, please call your pharmacy*   Lab Work: TODAY A1C CBC AND BMET  If you have labs (blood work) drawn today and your tests are completely normal, you will receive your results only by: [place] (if you have MyChart) OR A paper copy in the mail If you have any lab test that is abnormal or we need to change your treatment, we will call you to review the results.   Testing/Procedures: AS PLANNED   Follow-Up: At [facility], you and your health needs are our priority.  As part of our continuing mission to provide you with exceptional heart care, we have created designated Provider Care Teams.  These Care Teams include your primary Cardiologist (physician) and Advanced Practice Providers (APPs -  Physician Assistants and Nurse Practitioners) who all work together to provide you with the care you need, when you need it.  We recommend signing up for the patient portal called "MyChart".  Sign up information is provided on this After Visit Summary.  MyChart is used to connect with patients for Virtual Visits (Telemedicine).  Patients are able to view lab/test results, encounter notes, upcoming appointments, etc.  Non-urgent messages can be sent to your provider as well.   To learn more about what you can do with MyChart, go to [email or web address].    Your next appointment:      The format for your next appointment:     Provider:       Other Instructions   Important Information About Sugar

## 2022-08-01 NOTE — Progress Notes (Signed)
[facility]                                     Cardiology Office Note:    Date:  08/01/2022   ID:  Lucas Dudley, DOB [birth year], MRN [id number]  PCP:  [name], MD  [facility] Cardiologist:  Rozann Lesches, MD  [facility] Electrophysiologist:  Thompson Grayer, MD   Referring MD: [name], MD   Chief Complaint  Patient presents with   Follow-up    S/p LAAO    History of Present Illness:    Lucas Dudley is a 81 y.o. male with a hx of persistent atrial fibrillation, hx of GI bleeding, anemia, HTN, OSA on CPAP, and DM2 who was referred by Dr. [name] for the evaluation of [name] closure.    Lucas Dudley has a known hx of persistent atrial fibrillation and he has been managed with a rhythm control strategy using amiodarone and anticoagulation with apixaban. He was recently diagnosed with anemia with hemoglobin 7.6 and was referred to the emergency department found to have acute anemia and underwent upper and lower endoscopy studies which showed no significant bleeding source. 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 He required multiple PRBC transfusions and his apixaban was held throughout his hospitalization. He improved clinically with diuresis and his hemoglobin ultimately stabilized. At discharge, his anticoagulation was held. He followed up in the clinic with Dr. [name] who then referred him for consideration of left atrial appendage closure as an alternative to anticoagulation in the context of high recurrent bleeding risk. He was seen by Dr. [name] 06/13/22 and felt to be a good candidate for Watchman implant.    He underwent CT imaging which showed anatomy suitable for implant and he was therefore scheduled 06/28/22 for his procedure. He is now s/p left atrial appendage occlusive device placement with a 60m Watchman FLX device. Plan was for Eliquis 2.56mBID at discharge and then transition to Plavix  monotherapy at 6 weeks for a total of 6 months (10/28) out from procedure date.   He is here today with his daughter. He has been feeling well from a CV standpoint although states that he was having a lot of GI symptoms while taking Ozempic and is no longer on this. He feels that he is getting back to his self. He did have one incident with chest pain several weeks ago however feels this was before he stopped Ozempic. Symptoms have not recurred. He denies SOB, palpitations, LE edema, orthopnea, dizziness, syncope, or bleeding in stool or urine.   Past Medical History:  Diagnosis Date   Arthritis    Atrial fibrillation (HCC)    CAD (coronary artery disease)    a. Cath 03/17/15 showing 100% ostial D1, 50% prox LAD to mid LAD, 40% RPDA stenosis. Med rx. // Myoview 01/2020: EF 31 diffuse perfusion defect without reversibility (suspect artifact); reviewed with Dr. NeAggie Cosiertudy felt to be low risk   Cataract    Mixed form OD   Chronic diastolic CHF (congestive heart failure) (HCC)    Diabetic retinopathy ([place]   NPDR OU   Essential hypertension    Glaucoma    POAG OU   History of gout    Hyperlipidemia    Hypertensive retinopathy    OU   Kidney stones    Melanoma of neck ([place]  NICM (nonischemic cardiomyopathy) ([place])    OSA on CPAP 2012   Prostate cancer ([place])    Type II diabetes mellitus ([place])     Past Surgical History:  Procedure Laterality Date   ABDOMINAL HERNIA REPAIR     w/mesh   ATRIAL FIBRILLATION ABLATION N/A 06/06/2021   Procedure: ATRIAL FIBRILLATION ABLATION;  Surgeon: Thompson Grayer, MD;  Location: [place] [facility];  Service: Cardiovascular;  Laterality: N/A;   BIOPSY  05/24/2022   Procedure: BIOPSY;  Surgeon: Daneil Dolin, MD;  [facility];  Service: Endoscopy;;   BIOPSY  05/30/2022   Procedure: BIOPSY;  Surgeon: Daneil Dolin, MD;  [facility];  Service: Endoscopy;;   CARDIAC CATHETERIZATION N/A 03/17/2015   Procedure: Left Heart Cath  and Coronary Angiography;  Surgeon: Lorretta Harp, MD;  Location: [place] [facility];  Service: Cardiovascular;  Laterality: N/A;   CARDIOVERSION N/A 08/09/2021   Procedure: CARDIOVERSION;  Surgeon: [name], MD;  Location: [place];  Service: Cardiovascular;  Laterality: N/A;   CARDIOVERSION N/A 11/10/2021   Procedure: CARDIOVERSION;  Surgeon: Berniece Salines, DO;  Location: [place];  Service: Cardiovascular;  Laterality: N/A;   carotid doppler  09/17/2008   rigt and left ICAs 0-49%;mildly  abnormal   CATARACT EXTRACTION Left 2020   Dr. Kathlen Mody   COLONOSCOPY N/A 05/16/2017   Procedure: COLONOSCOPY;  Surgeon: Rogene Houston, MD;  [facility];  Service: Endoscopy;  Laterality: N/A;  930   COLONOSCOPY WITH PROPOFOL N/A 05/24/2022   Procedure: COLONOSCOPY WITH PROPOFOL;  Surgeon: Daneil Dolin, MD;  [facility];  Service: Endoscopy;  Laterality: N/A;   DOPPLER ECHOCARDIOGRAPHY  05/25/2009   EF 50-55%,LA mildly dilated, LV function normal   ELECTROPHYSIOLOGIC STUDY N/A 04/05/2015   Procedure: Cardioversion;  Surgeon: Sanda Klein, MD;  Location: [place] [facility];  Service: Cardiovascular;  Laterality: N/A;   ELECTROPHYSIOLOGIC STUDY N/A 09/06/2015   Procedure: Atrial Fibrillation Ablation;  Surgeon: Thompson Grayer, MD;  Location: [place] [facility];  Service: Cardiovascular;  Laterality: N/A;   ELECTROPHYSIOLOGIC STUDY N/A 07/12/2016   redo afib ablation by Dr Rayann Heman   ENTEROSCOPY N/A 05/30/2022   Procedure: ENTEROSCOPY;  Surgeon: Daneil Dolin, MD;  [facility];  Service: Endoscopy;  Laterality: N/A;   ESOPHAGOGASTRODUODENOSCOPY (EGD) WITH PROPOFOL N/A 05/24/2022   Procedure: ESOPHAGOGASTRODUODENOSCOPY (EGD) WITH PROPOFOL;  Surgeon: Daneil Dolin, MD;  [facility];  Service: Endoscopy;  Laterality: N/A;   EXCISIONAL HEMORRHOIDECTOMY     "inside and out"   EYE SURGERY Left 2020   Cat Sx - Dr. Kathlen Mody   FINE NEEDLE ASPIRATION  Right    knee; "drew ~ 1 quart off"   GIVENS CAPSULE STUDY N/A 05/25/2022   Procedure: GIVENS CAPSULE STUDY;  Surgeon: Eloise Harman, DO;  [facility];  Service: Endoscopy;  Laterality: N/A;   HERNIA REPAIR     LAPAROSCOPIC CHOLECYSTECTOMY     LEFT ATRIAL APPENDAGE OCCLUSION N/A 06/28/2022   Procedure: LEFT ATRIAL APPENDAGE OCCLUSION;  Surgeon: Sherren Mocha, MD;  Location: [place] [facility];  Service: Cardiovascular;  Laterality: N/A;   MELANOMA EXCISION Right    "neck"   NM MYOCAR PERF WALL MOTION  02/21/2012   EF 61% ,EXERCISE 7 METS. exercise stopped due to wheezing and shortness of breathe   POLYPECTOMY  05/16/2017   Procedure: POLYPECTOMY;  Surgeon: Rogene Houston, MD;  [facility];  Service: Endoscopy;;  colon   POLYPECTOMY  05/24/2022   Procedure: POLYPECTOMY;  Surgeon: Daneil Dolin, MD;  [facility];  Service: Endoscopy;;   PROSTATECTOMY     SHOULDER OPEN ROTATOR CUFF REPAIR Right X 2   SUBMUCOSAL TATTOO INJECTION  05/30/2022   Procedure: SUBMUCOSAL TATTOO INJECTION;  Surgeon: Daneil Dolin, MD;  [facility];  Service: Endoscopy;;   TEE WITHOUT CARDIOVERSION N/A 09/05/2015   Procedure: TRANSESOPHAGEAL ECHOCARDIOGRAM (TEE);  Surgeon: Sueanne Margarita, MD;  [facility];  Service: Cardiovascular;  Laterality: N/A;   TEE WITHOUT CARDIOVERSION N/A 06/28/2022   Procedure: TRANSESOPHAGEAL ECHOCARDIOGRAM (TEE);  Surgeon: Sherren Mocha, MD;  Location: [place] [facility];  Service: Cardiovascular;  Laterality: N/A;    Current Medications: Current Meds  Medication Sig   clopidogrel (PLAVIX) 75 MG tablet Take 1 tablet (75 mg total) by mouth daily. START 10/29 AFTER TAKING LAST DOSE OF ELIQUIS 2.5 MG ON 10/28   Current Facility-Administered Medications for the 08/01/22 encounter (Office Visit) with [facility]  Medication   0.9 %  sodium chloride infusion   0.9 %  sodium chloride infusion      Allergies:   Azithromycin and Tape   Social History   Socioeconomic History   Marital status: Widowed    Spouse name: Not on file   Number of children: 1   Years of education: Not on file   Highest education level: Not on file  Occupational History   Occupation: Retired  Tobacco Use   Smoking status: Former    Packs/day: 2.00    Years: 27.00    Total pack years: 54.00    Types: Cigarettes    Quit date: 1992    Years since quitting: 31.8   Smokeless tobacco: Never   Tobacco comments:    Former smoker 09/27/21  Vaping Use   Vaping Use: Never used  Substance and Sexual Activity   Alcohol use: No    Alcohol/week: 0.0 standard drinks of alcohol    Comment: "used to drink; stopped ~ 2008"   Drug use: No   Sexual activity: Not Currently  Other Topics Concern   Not on file  Social History Narrative   Lives in [place], [place] with wife.   Social Determinants of Health   Financial Resource Strain: Not on file  Food Insecurity: No Food Insecurity (06/29/2022)   Hunger Vital Sign    Worried About Running Out of Food in the Last Year: Never true    Ran Out of Food in the Last Year: Never true  Transportation Needs: No Transportation Needs (06/29/2022)   PRAPARE - Hydrologist (Medical): No    Lack of Transportation (Non-Medical): No  Physical Activity: Not on file  Stress: Not on file  Social Connections: Not on file     Family History: The patient's family history includes Colon cancer in his maternal aunt; Diabetes in his father; Healthy in his daughter; Heart attack (age of onset: 82) in his mother; Heart disease in his father and mother; Lung cancer in his mother; Stroke in his brother.  ROS:   Please see the history of present illness.    All other systems reviewed and are negative.  EKGs/Labs/Other Studies Reviewed:    The following studies were reviewed today:  LAAO closure 06/28/22:  Procedures This Admission:  Transeptal  Puncture Intra-procedural TEE which showed no LAA thrombus Left atrial appendage occlusive device placement on 06/28/22 by Dr. [name].  Recent Labs: 05/21/2022: ALT 14; B Natriuretic Peptide 166.2; TSH 3.037 06/01/2022: Magnesium 1.6 06/28/2022: BUN 23; Creatinine, Ser 1.15; Potassium 3.8; Sodium 140 07/25/2022: Hemoglobin 10.5; Platelets 272   Recent Lipid Panel No results found for: "CHOL", "TRIG", "HDL", "CHOLHDL", "VLDL", "LDLCALC", "LDLDIRECT"  Physical Exam:    VS:  BP (!) 144/64   Pulse (!) 52   Ht '5\' 10"'  (1.778 m)   Wt 238 lb 3.2 oz (108 kg)   SpO2 97%   BMI 34.18 kg/m     Wt Readings from Last 3 Encounters:  08/01/22 238 lb 3.2 oz (108 kg)  07/24/22 238 lb 12.8 oz (108.3 kg)  06/28/22 236 lb (107 kg)    General: Well developed, well nourished, NAD Lungs:Clear to ausculation bilaterally. No wheezes, rales, or rhonchi. Breathing is unlabored. Cardiovascular: RRR with S1 S2. No murmurs Extremities: No edema.  Neuro: Alert and oriented. No focal deficits. No facial asymmetry. MAE spontaneously. Psych: Responds to questions appropriately with normal affect.    ASSESSMENT/PLAN:    Persistent atrial fibrillation: s/p LAAO closure with a 20m Watchman FLX device. He was restarted on low-dose apixaban 2.5 mg twice daily 5 days before the procedure and has tolerated this well. He will stop Eliquis 10/28 and start Plavix 764mQD 10/29 for a duration of 6 months (12/27/22). No SBE needed as he has full upper and lower dentures. Plan post op CT 11/13. Instructions reviewed with understanding. Obtain CMET, CBC. He was seen by PCP yesterday with plans for labwork. Will also obtain Hb A1c.I will send labs to PCP. Plan to follow with our team 12/2022. This has been scheduled.  HTN: Elevated above baseline today. No changes. Continue to monitor   Medication Adjustments/Labs and Tests Ordered: Current medicines are reviewed at length with the patient today.  Concerns regarding medicines  are outlined above.  Orders Placed This Encounter  Procedures   CBC   Comp Met (CMET)   HgB A1c   Meds ordered this encounter  Medications   clopidogrel (PLAVIX) 75 MG tablet    Sig: Take 1 tablet (75 mg total) by mouth daily. START 10/29 AFTER TAKING LAST DOSE OF ELIQUIS 2.5 MG ON 10/28    Dispense:  90 tablet    Refill:  3    Patient Instructions  Medication Instructions:  TAKE LAST DOSE OF ELIQUIS ON 10/28 PM START CLOPIDOGREL 75 MG DAILY ON 10/29 *If you need a refill on your cardiac medications before your next appointment, please call your pharmacy*   Lab Work: TODAY A1C CBC AND BMET  If you have labs (blood work) drawn today and your tests are completely normal, you will receive your results only by: MyFollettif you have MyChart) OR A paper copy in the mail If you have any lab test that is abnormal or we need to change your treatment, we will call you to review the results.   Testing/Procedures: AS PLANNED   Follow-Up: [facility] are our [facility].  As part of our continuing mission to provide you with exceptional heart care, we have created designated Provider Care Teams.  These Care Teams include your primary Cardiologist (physician) and Advanced Practice Providers (APPs -  Physician Assistants and Nurse Practitioners) who all work together to provide you with the care you need, when you need it.  We recommend signing up for the patient portal called "MyChart".  Sign up information is provided on this After Visit Summary.  MyChart  is used to connect with patients for Virtual Visits (Telemedicine).  Patients are able to view lab/test results, encounter notes, upcoming appointments, etc.  Non-urgent messages can be sent to your provider as well.   To learn more about what you can do with MyChart, go to [email or web address].    Your next appointment:      The format for your next appointment:     Provider:        Other Instructions   Important Information About Sugar         Signed, Kathyrn Drown, NP  08/01/2022 11:15 AM    [place]

## 2022-08-02 LAB — CBC
Hematocrit: 32.6 % — ABNORMAL LOW (ref 37.5–51.0)
Hemoglobin: 10.5 g/dL — ABNORMAL LOW (ref 13.0–17.7)
MCH: 29.8 pg (ref 26.6–33.0)
MCHC: 32.2 g/dL (ref 31.5–35.7)
MCV: 93 fL (ref 79–97)
Platelets: 313 10*3/uL (ref 150–450)
RBC: 3.52 x10E6/uL — ABNORMAL LOW (ref 4.14–5.80)
RDW: 19.9 % — ABNORMAL HIGH (ref 11.6–15.4)
WBC: 6.4 10*3/uL (ref 3.4–10.8)

## 2022-08-02 LAB — COMPREHENSIVE METABOLIC PANEL
ALT: 11 IU/L (ref 0–44)
AST: 15 IU/L (ref 0–40)
Albumin/Globulin Ratio: 1.6 (ref 1.2–2.2)
Albumin: 4.2 g/dL (ref 3.8–4.8)
Alkaline Phosphatase: 75 IU/L (ref 44–121)
BUN/Creatinine Ratio: 17 (ref 10–24)
BUN: 22 mg/dL (ref 8–27)
Bilirubin Total: <0.2 mg/dL (ref 0.0–1.2)
CO2: 32 mmol/L — ABNORMAL HIGH (ref 20–29)
Calcium: 9.4 mg/dL (ref 8.6–10.2)
Chloride: 98 mmol/L (ref 96–106)
Creatinine, Ser: 1.32 mg/dL — ABNORMAL HIGH (ref 0.76–1.27)
Globulin, Total: 2.7 g/dL (ref 1.5–4.5)
Glucose: 218 mg/dL — ABNORMAL HIGH (ref 70–99)
Potassium: 5.2 mmol/L (ref 3.5–5.2)
Sodium: 142 mmol/L (ref 134–144)
Total Protein: 6.9 g/dL (ref 6.0–8.5)
eGFR: 55 mL/min/{1.73_m2} — ABNORMAL LOW (ref >59)

## 2022-08-02 LAB — HEMOGLOBIN A1C
Est. average glucose Bld gHb Est-mCnc: 146 mg/dL
Hgb A1c MFr Bld: 6.7 % — ABNORMAL HIGH (ref 4.8–5.6)

## 2022-08-09 NOTE — Progress Notes (Signed)
[facility] Note  08/13/2022    CHIEF COMPLAINT Patient presents for Retina Follow Up  HISTORY OF PRESENT ILLNESS: Lucas Dudley is a 81 y.o. male who presents to the clinic today for:  HPI     Retina Follow Up   Patient presents with  Diabetic Retinopathy.  In both eyes.  Severity is moderate.  Duration of 4 weeks.  Since onset it is stable.  I, the attending physician,  performed the HPI with the patient and updated documentation appropriately.        Comments   Retina follow up 4 weeks NPDR OU IVE OD IVV OS pt reports vision seems better pt bs 150 this am A1C 8 range few weeks       Last edited by Bernarda Caffey, MD on 08/13/2022  4:45 PM.    Patient states vision seems better.   Referring physician: [name], MD [place],  [place] [address]  HISTORICAL INFORMATION:   Selected notes from the MEDICAL RECORD NUMBER Referred by Dr. Kathlen Mody for DEE   CURRENT MEDICATIONS: Current Outpatient Medications (Ophthalmic Drugs)  Medication Sig   COMBIGAN 0.2-0.5 % ophthalmic solution Apply 1 drop to eye 2 (two) times daily.   No current facility-administered medications for this visit. (Ophthalmic Drugs)   Current Outpatient Medications (Other)  Medication Sig   albuterol (VENTOLIN HFA) 108 (90 Base) MCG/ACT inhaler Inhale 2 puffs into the lungs every 4 (four) hours as needed for wheezing or shortness of breath.   amiodarone (PACERONE) 200 MG tablet TAKE 1 TABLET BY MOUTH EVERY DAY   apixaban (ELIQUIS) 2.5 MG TABS tablet Take 2.5 mg by mouth 2 (two) times daily.   camphor-menthol (ANTI-ITCH) lotion Apply 1 Application topically daily as needed for itching.   carvedilol (COREG) 3.125 MG tablet Take 3.125 mg by mouth 2 (two) times daily with a meal.   clopidogrel (PLAVIX) 75 MG tablet Take 1 tablet (75 mg total) by mouth daily. START 10/29 AFTER TAKING LAST DOSE OF ELIQUIS 2.5 MG ON 10/28   CRANBERRY PO Take 1 tablet by mouth  daily.   diphenhydramine-acetaminophen (TYLENOL PM) 25-500 MG TABS tablet Take 2 tablets by mouth at bedtime.   docusate sodium (COLACE) 100 MG capsule Take 1 capsule (100 mg total) by mouth daily. (Patient taking differently: Take 100 mg by mouth 2 (two) times daily.)   ferrous sulfate 325 (65 FE) MG tablet Take 1 tablet (325 mg total) by mouth 2 (two) times daily with a meal.   gemfibrozil (LOPID) 600 MG tablet Take 600 mg by mouth at bedtime.    glimepiride (AMARYL) 2 MG tablet Take 2 mg by mouth at bedtime.   Insulin Glargine (BASAGLAR KWIKPEN) 100 UNIT/ML Inject 32 Units into the skin daily.   KLOR-CON M20 20 MEQ tablet TAKE 1 TABLET BY MOUTH EVERY DAY   lisinopril (ZESTRIL) 20 MG tablet Take 1 tablet (20 mg total) by mouth daily.   Melatonin 10 MG TABS Take 10 mg by mouth at bedtime.   metFORMIN (GLUCOPHAGE-XR) 500 MG 24 hr tablet Take 1,000 mg by mouth in the morning and at bedtime.   mineral oil liquid Take 15 mLs by mouth daily as needed for moderate constipation.   Multiple Vitamins-Minerals (MULTIVITAMIN WITH MINERALS) tablet Take 1 tablet by mouth daily. Centrum men 50+   Omega-3 1000 MG CAPS Take 1,000 mg by mouth daily.   OZEMPIC, 1 MG/DOSE, 4 MG/3ML SOPN Inject 1 mg into  the skin once a week. Saturday   torsemide (DEMADEX) 20 MG tablet Take 1 tablet (20 mg total) by mouth 2 (two) times daily.   valACYclovir (VALTREX) 1000 MG tablet Take 1,000 mg by mouth at bedtime.   Current Facility-Administered Medications (Other)  Medication Route   0.9 %  sodium chloride infusion Intravenous   0.9 %  sodium chloride infusion Intravenous   REVIEW OF SYSTEMS: ROS   Positive for: Gastrointestinal, Endocrine, Cardiovascular, Eyes, Respiratory Negative for: Constitutional, Neurological, Skin, Genitourinary, Musculoskeletal, HENT, Psychiatric, Allergic/Imm, Heme/Lymph Last edited by Kingsley Spittle, COT on 08/13/2022  3:15 PM.      ALLERGIES Allergies  Allergen Reactions    Azithromycin Nausea Only   Tape Rash and Other (See Comments)    Causes skin redness, Use paper tape only.   PAST MEDICAL HISTORY Past Medical History:  Diagnosis Date   Arthritis    Atrial fibrillation (HCC)    CAD (coronary artery disease)    a. Cath 03/17/15 showing 100% ostial D1, 50% prox LAD to mid LAD, 40% RPDA stenosis. Med rx. // Myoview 01/2020: EF 31 diffuse perfusion defect without reversibility (suspect artifact); reviewed with Dr. Aggie Cosier study felt to be low risk   Cataract    Mixed form OD   Chronic diastolic CHF (congestive heart failure) (HCC)    Diabetic retinopathy ([place])    NPDR OU   Essential hypertension    Glaucoma    POAG OU   History of gout    Hyperlipidemia    Hypertensive retinopathy    OU   Kidney stones    Melanoma of neck (HCC)    NICM (nonischemic cardiomyopathy) ([place])    OSA on CPAP 2012   Prostate cancer ([place])    Type II diabetes mellitus ([place])    Past Surgical History:  Procedure Laterality Date   ABDOMINAL HERNIA REPAIR     w/mesh   ATRIAL FIBRILLATION ABLATION N/A 06/06/2021   Procedure: ATRIAL FIBRILLATION ABLATION;  Surgeon: Thompson Grayer, MD;  Location: [facility];  Service: Cardiovascular;  Laterality: N/A;   BIOPSY  05/24/2022   Procedure: BIOPSY;  Surgeon: Daneil Dolin, MD;  [facility];  Service: Endoscopy;;   BIOPSY  05/30/2022   Procedure: BIOPSY;  Surgeon: Daneil Dolin, MD;  [facility];  Service: Endoscopy;;   CARDIAC CATHETERIZATION N/A 03/17/2015   Procedure: Left Heart Cath and Coronary Angiography;  Surgeon: Lorretta Harp, MD;  Location: [facility];  Service: Cardiovascular;  Laterality: N/A;   CARDIOVERSION N/A 08/09/2021   Procedure: CARDIOVERSION;  Surgeon: [name], MD;  Location: [facility];  Service: Cardiovascular;  Laterality: N/A;   CARDIOVERSION N/A 11/10/2021   Procedure: CARDIOVERSION;  Surgeon: Berniece Salines, DO;  Location: [facility];  Service:  Cardiovascular;  Laterality: N/A;   carotid doppler  09/17/2008   rigt and left ICAs 0-49%;mildly  abnormal   CATARACT EXTRACTION Left 2020   Dr. Kathlen Mody   COLONOSCOPY N/A 05/16/2017   Procedure: COLONOSCOPY;  Surgeon: Rogene Houston, MD;  [facility];  Service: Endoscopy;  Laterality: N/A;  930   COLONOSCOPY WITH PROPOFOL N/A 05/24/2022   Procedure: COLONOSCOPY WITH PROPOFOL;  Surgeon: Daneil Dolin, MD;  [facility];  Service: Endoscopy;  Laterality: N/A;   DOPPLER ECHOCARDIOGRAPHY  05/25/2009   EF 50-55%,LA mildly dilated, LV function normal   ELECTROPHYSIOLOGIC STUDY N/A 04/05/2015   Procedure: Cardioversion;  Surgeon: Sanda Klein, MD;  Location: [facility]  INVASIVE [facility];  Service: Cardiovascular;  Laterality: N/A;   ELECTROPHYSIOLOGIC STUDY N/A 09/06/2015   Procedure: Atrial Fibrillation Ablation;  Surgeon: Thompson Grayer, MD;  Location: [facility];  Service: Cardiovascular;  Laterality: N/A;   ELECTROPHYSIOLOGIC STUDY N/A 07/12/2016   redo afib ablation by Dr Rayann Heman   ENTEROSCOPY N/A 05/30/2022   Procedure: ENTEROSCOPY;  Surgeon: Daneil Dolin, MD;  [facility];  Service: Endoscopy;  Laterality: N/A;   ESOPHAGOGASTRODUODENOSCOPY (EGD) WITH PROPOFOL N/A 05/24/2022   Procedure: ESOPHAGOGASTRODUODENOSCOPY (EGD) WITH PROPOFOL;  Surgeon: Daneil Dolin, MD;  [facility];  Service: Endoscopy;  Laterality: N/A;   EXCISIONAL HEMORRHOIDECTOMY     "inside and out"   EYE SURGERY Left 2020   Cat Sx - Dr. Kathlen Mody   FINE NEEDLE ASPIRATION Right    knee; "drew ~ 1 quart off"   GIVENS CAPSULE STUDY N/A 05/25/2022   Procedure: GIVENS CAPSULE STUDY;  Surgeon: Eloise Harman, DO;  [facility];  Service: Endoscopy;  Laterality: N/A;   HERNIA REPAIR     LAPAROSCOPIC CHOLECYSTECTOMY     LEFT ATRIAL APPENDAGE OCCLUSION N/A 06/28/2022   Procedure: LEFT ATRIAL APPENDAGE OCCLUSION;  Surgeon: Sherren Mocha, MD;  Location: [facility];   Service: Cardiovascular;  Laterality: N/A;   MELANOMA EXCISION Right    "neck"   NM MYOCAR PERF WALL MOTION  02/21/2012   EF 61% ,EXERCISE 7 METS. exercise stopped due to wheezing and shortness of breathe   POLYPECTOMY  05/16/2017   Procedure: POLYPECTOMY;  Surgeon: Rogene Houston, MD;  [facility];  Service: Endoscopy;;  colon   POLYPECTOMY  05/24/2022   Procedure: POLYPECTOMY;  Surgeon: Daneil Dolin, MD;  [facility];  Service: Endoscopy;;   PROSTATECTOMY     SHOULDER OPEN ROTATOR CUFF REPAIR Right X 2   SUBMUCOSAL TATTOO INJECTION  05/30/2022   Procedure: SUBMUCOSAL TATTOO INJECTION;  Surgeon: Daneil Dolin, MD;  [facility];  Service: Endoscopy;;   TEE WITHOUT CARDIOVERSION N/A 09/05/2015   Procedure: TRANSESOPHAGEAL ECHOCARDIOGRAM (TEE);  Surgeon: Sueanne Margarita, MD;  Location: [facility];  Service: Cardiovascular;  Laterality: N/A;   TEE WITHOUT CARDIOVERSION N/A 06/28/2022   Procedure: TRANSESOPHAGEAL ECHOCARDIOGRAM (TEE);  Surgeon: Sherren Mocha, MD;  Location: [facility];  Service: Cardiovascular;  Laterality: N/A;   FAMILY HISTORY Family History  Problem Relation Age of Onset   Heart disease Mother    Lung cancer Mother    Heart attack Mother 62   Diabetes Father    Heart disease Father    Stroke Brother    Healthy Daughter    Colon cancer Maternal Aunt        13s   SOCIAL HISTORY Social History   Tobacco Use   Smoking status: Former    Packs/day: 2.00    Years: 27.00    Total pack years: 54.00    Types: Cigarettes    Quit date: 1992    Years since quitting: 31.8   Smokeless tobacco: Never   Tobacco comments:    Former smoker 09/27/21  Vaping Use   Vaping Use: Never used  Substance Use Topics   Alcohol use: No    Alcohol/week: 0.0 standard drinks of alcohol    Comment: "used to drink; stopped ~ 2008"   Drug use: No       OPHTHALMIC EXAM: Base Eye Exam     Visual Acuity (Snellen - Linear)  Right Left   Dist cc 20/30 -2 20/30 -2   Dist ph cc NI NI    Correction: Glasses         Tonometry (Tonopen, 2:34 PM)       Right Left   Pressure 16 16         Pupils       Pupils Dark Light Shape React APD   Right PERRL 2 1 Round Brisk None   Left PERRL 2 1 Round Brisk None         Visual Fields       Left Right    Full Full         Extraocular Movement       Right Left    Full, Ortho Full, Ortho         Neuro/Psych     Oriented x3: Yes   Mood/Affect: Normal           Slit Lamp and Fundus Exam     Slit Lamp Exam       Right Left   Lids/Lashes Dermato, mild MGD Dermato, mild MGD   Conjunctiva/Sclera Temporal pinguecula, mild inferior sub conj heme Temporal pinguecula   Cornea EBMD, mild haze, trace PEE, mild Debris in tear film, well healed cataract wound trace haze, trace PEE, well healed temporal cataract wounds, arcus   Anterior Chamber Deep and clear; narrow temporal angle Deep and clear   Iris Round and dilated, mild anterior bowing, No NVI Round and moderately dilated to 5.47m   Lens PCIOL in good position, trace PCO PC IOL in good position with open PC   Anterior Vitreous Synerisis Synerisis         Fundus Exam       Right Left   Disc Mild pallor, sharp rim, +cupping, thin inferior rim Mild pallor, sharp rim, +cupping, +PPA   C/D Ratio 0.7 0.6   Macula Flat, blunted foveal reflex, cystic changes - decreased, RPE mottling and clumping, no heme Flat, blunted foveal reflex, clusters of fine MA nasal and superior to fovea -- improved, +cystic changes -- slightly improved   Vessels attenuated, Tortuous Attenuated, mild av crossing changes, mild tortuousity   Periphery Attached, rare MA, forcal DBH nasal to disc Attached. No heme.           Refraction     Wearing Rx       Sphere Cylinder Axis Add   Right -0.25 +2.00 158 +2.50   Left -1.00 +1.25 008 +2.50           IMAGING AND PROCEDURES  Imaging and Procedures for  08/13/2022  OCT, Retina - OU - Both Eyes       Right Eye Quality was good. Central Foveal Thickness: 434. Progression has improved. Findings include no SRF, abnormal foveal contour, intraretinal fluid, vitreomacular adhesion (interval decrease in central IRF).   Left Eye Quality was good. Central Foveal Thickness: 318. Progression has improved. Findings include no SRF, abnormal foveal contour, intraretinal hyper-reflective material, intraretinal fluid (Mild interval improvement in IRF/IRHM superior fovea; partial PVD).   Notes *Images captured and stored on drive  Diagnosis / Impression:  DME OU OD: interval decrease in central IRF OS: mild interval improvement in IRF/IRHM superior fovea; partial PVD  Clinical management:  See below  Abbreviations: NFP - Normal foveal profile. CME - cystoid macular edema. PED - pigment epithelial detachment. IRF - intraretinal fluid. SRF - subretinal fluid. EZ - ellipsoid zone. ERM - epiretinal membrane. ORA -  outer retinal atrophy. ORT - outer retinal tubulation. SRHM - subretinal hyper-reflective material. IRHM - intraretinal hyper-reflective material     Intravitreal Injection, Pharmacologic Agent - OD - Right Eye       Time Out 08/13/2022. 3:30 PM. Confirmed correct patient, procedure, site, and patient consented.   Anesthesia Topical anesthesia was used. Anesthetic medications included Lidocaine 2%, Proparacaine 0.5%.   Procedure Preparation included 5% betadine to ocular surface, eyelid speculum. A (32g) needle was used.   Injection: 2 mg aflibercept 2 MG/0.05ML   Route: Intravitreal, Site: Right Eye   NDC: A3590391, Lot: [id number], Expiration date: 11/15/2023, Waste: 0 mL   Post-op Post injection exam found visual acuity of at least counting fingers. The patient tolerated the procedure well. There were no complications. The patient received written and verbal post procedure care education.      Intravitreal Injection,  Pharmacologic Agent - OS - Left Eye       Time Out 08/13/2022. 3:31 PM. Confirmed correct patient, procedure, site, and patient consented.   Anesthesia Topical anesthesia was used. Anesthetic medications included Lidocaine 2%, Proparacaine 0.5%.   Procedure Preparation included 5% betadine to ocular surface, eyelid speculum. A (32g) needle was used.   Injection: 6 mg faricimab-svoa 6 MG/0.05ML   Route: Intravitreal, Site: Left Eye   NDC: S6832610, Lot: [id number], Expiration date: 09/13/2024, Waste: 0 mL   Post-op Post injection exam found visual acuity of at least counting fingers. The patient tolerated the procedure well. There were no complications. The patient received written and verbal post procedure care education. Post injection medications were not given.            ASSESSMENT/PLAN:   ICD-10-CM   1. 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, associated with type 2 diabetes mellitus (HCC)  M54.6503 OCT, Retina - OU - Both Eyes    Intravitreal Injection, Pharmacologic Agent - OD - Right Eye    Intravitreal Injection, Pharmacologic Agent - OS - Left Eye    faricimab-svoa (VABYSMO) '6mg'$ /0.79m intravitreal injection    aflibercept (EYLEA) SOLN 2 mg    2. Essential hypertension  I10     3. Hypertensive retinopathy of both eyes  H35.033     4. Pseudophakia of both eyes  Z96.1     5. 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 History of herpes zoster of eye  Z86.19     7. Primary open angle glaucoma of both eyes, unspecified glaucoma stage  H40.1130      1. 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 Single focal MA superior to fovea; OS: Perifoveal Mas w/ late leakage - s/p IVA OS # 1(07.26.21), #2 (08.24.21), #3 (09.21.21), #4 (10.22.21) -- IVA resistance - s/p IVE OS #1 (11.22.21), #2 (12.21.21), #3 (01.20.22), #4 (02.18.22), #5 (03.18.22), #6 (04.15.22), #7  (05.10.22) -- sample, #8 (06.08.22), #9 (07.12.22), #10 (08.12.22), #11 (09.16.22), #12 (10.21.22), #13 (11.18.22), #14 (12.21.22), #15 (01.20.23), #16 (02.17.23), #17 (03.24.23), #18 (04.21.23), #19 (05.26.23), #20 (06.23.23) -- IVE resistance - s/p IVV OS #1 (07.21.23), #2 (08.25.23), #3 (09.29.23) - s/p IVE OD #1 (09.29.23) - BCVA OD: improved from 20/60 to 20/30-2; OS stable at 20/30-2  - OCT shows OD: interval improvement in central IRF; OS: interval improvement in IRF/IRHM superior fovea, partial PVD at 4 weeks  - recommend IVE OD #2 and IVV OS #4 today, 10.30.23 with f/u in 4 weeks  - pt wishes  to proceed with injections  - RBA of procedure discussed, questions answered  - Vabysmo informed consent obtained and signed, 07.21.23 (OS) - see procedure note - IVA informed consent obtained and signed, 07.26.21 (OS) - IVE informed consent obtained and signed, 04.21.23 (OS) - IVE informed consent obtained and signed, 09.29.23 (OD) - Good Days approved until 10/14/22 - [employer] approved Vabysmo - f/u 4 weeks -- DFE/OCT, possible injection  2,3. Hypertensive retinopathy OU - discussed importance of tight BP control - Continue to monitor   4. Pseudophakia OU  - s/p CE/IOL OS (2020, Dr. Kathlen Mody)             - s/p CE/IOL OD (2022, Dr. Kathlen Mody)  - s/p YAG cap OS (04.04.23) - BCVA 20/25 from 20/150  - IOLs in good position, doing well  - Continue to monitor   5. 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 Susa Simmonds in August 2022  - s/p SuperK OS on September 25, 2021  6. History of herpes zoster/iritis OS  - on valtrex 1g daily---maintenance   7. POAG OU  - was under the expert management of Dr. Kathlen Mody   - IOP 16 OU today, 10.30.23  - Continue Cosopt BID OU  Ophthalmic Meds Ordered this visit:  Meds ordered this encounter  Medications   faricimab-svoa (VABYSMO) '6mg'$ /0.66m intravitreal injection   aflibercept (EYLEA) SOLN 2 mg     Return in about 4 weeks (around 09/10/2022) for DFE,  OCT.  There are no Patient Instructions on file for this visit.  This document serves as a record of services personally performed by BGardiner Sleeper MD, PhD. It was created on their behalf by DRoselee Nova COMT. The creation of this record is the provider's dictation and/or activities during the visit.  Electronically signed by: DRoselee Nova COMT 08/14/22 12:04 AM  BGardiner Sleeper M.D., Ph.D. Diseases & Surgery of the Retina and Vitreous [facility] I have reviewed the above documentation for accuracy and completeness, and I agree with the above. BGardiner Sleeper M.D., Ph.D. 08/14/22 12:26 AM   Abbreviations: M myopia (nearsighted); A astigmatism; H hyperopia (farsighted); P presbyopia; Mrx spectacle prescription;  CTL contact lenses; OD right eye; OS left eye; OU both eyes  XT exotropia; ET esotropia; PEK punctate epithelial keratitis; PEE punctate epithelial erosions; DES dry eye syndrome; MGD meibomian gland dysfunction; ATs artificial tears; PFAT's preservative free artificial tears; [place] sclerotic cataract; PSC posterior subcapsular cataract; ERM epi-retinal membrane; PVD posterior vitreous detachment; RD retinal detachment; DM diabetes mellitus; DR diabetic retinopathy; NPDR non-proliferative diabetic retinopathy; PDR proliferative diabetic retinopathy; CSME clinically significant macular edema; DME diabetic macular edema; dbh dot blot hemorrhages; CWS cotton wool spot; POAG primary open angle glaucoma; C/D cup-to-disc ratio; HVF humphrey visual field; GVF goldmann visual field; OCT optical coherence tomography; IOP intraocular pressure; BRVO Branch retinal vein occlusion; CRVO central retinal vein occlusion; CRAO central retinal artery occlusion; BRAO branch retinal artery occlusion; RT retinal tear; SB scleral buckle; PPV pars plana vitrectomy; VH Vitreous hemorrhage; PRP panretinal laser photocoagulation; IVK intravitreal kenalog; VMT vitreomacular  traction; MH Macular hole;  NVD neovascularization of the disc; NVE neovascularization elsewhere; AREDS age related eye disease study; ARMD age related macular degeneration; POAG primary open angle glaucoma; EBMD epithelial/anterior basement membrane dystrophy; ACIOL anterior chamber intraocular lens; IOL intraocular lens; PCIOL posterior chamber intraocular lens; Phaco/IOL phacoemulsification with intraocular lens placement; PRK photorefractive keratectomy; LASIK laser assisted in situ keratomileusis; HTN hypertension;  DM diabetes mellitus; COPD chronic obstructive pulmonary disease

## 2022-08-13 ENCOUNTER — Ambulatory Visit (INDEPENDENT_AMBULATORY_CARE_PROVIDER_SITE_OTHER): Payer: Medicare Other | Admitting: Ophthalmology

## 2022-08-13 ENCOUNTER — Encounter (INDEPENDENT_AMBULATORY_CARE_PROVIDER_SITE_OTHER): Payer: Self-pay | Admitting: Ophthalmology

## 2022-08-13 DIAGNOSIS — H18523 Epithelial (juvenile) corneal dystrophy, bilateral: Secondary | ICD-10-CM

## 2022-08-13 DIAGNOSIS — Z961 Presence of intraocular lens: Secondary | ICD-10-CM

## 2022-08-13 DIAGNOSIS — I10 Essential (primary) hypertension: Secondary | ICD-10-CM

## 2022-08-13 DIAGNOSIS — Z8619 Personal history of other infectious and parasitic diseases: Secondary | ICD-10-CM

## 2022-08-13 DIAGNOSIS — E113213 Type 2 diabetes mellitus with mild nonproliferative diabetic retinopathy with macular edema, bilateral: Secondary | ICD-10-CM | POA: Diagnosis not present

## 2022-08-13 DIAGNOSIS — H35033 Hypertensive retinopathy, bilateral: Secondary | ICD-10-CM | POA: Diagnosis not present

## 2022-08-13 DIAGNOSIS — H401130 Primary open-angle glaucoma, bilateral, stage unspecified: Secondary | ICD-10-CM

## 2022-08-13 MED ORDER — AFLIBERCEPT 2MG/0.05ML IZ SOLN FOR KALEIDOSCOPE
2.0000 mg | INTRAVITREAL | Status: AC | PRN
  Administered 2022-08-13: 2 mg via INTRAVITREAL

## 2022-08-13 MED ORDER — FARICIMAB-SVOA 6 MG/0.05ML IZ SOLN
6.0000 mg | INTRAVITREAL | Status: AC | PRN
  Administered 2022-08-13: 6 mg via INTRAVITREAL

## 2022-08-27 ENCOUNTER — Encounter (INDEPENDENT_AMBULATORY_CARE_PROVIDER_SITE_OTHER): Payer: Self-pay | Admitting: Gastroenterology

## 2022-08-27 ENCOUNTER — Ambulatory Visit (INDEPENDENT_AMBULATORY_CARE_PROVIDER_SITE_OTHER): Payer: Medicare Other | Admitting: Gastroenterology

## 2022-08-27 ENCOUNTER — Telehealth: Payer: Self-pay

## 2022-08-27 VITALS — BP 107/42 | HR 45 | Temp 97.3°F | Ht 70.0 in | Wt 238.4 lb

## 2022-08-27 DIAGNOSIS — K5900 Constipation, unspecified: Secondary | ICD-10-CM

## 2022-08-27 DIAGNOSIS — D500 Iron deficiency anemia secondary to blood loss (chronic): Secondary | ICD-10-CM

## 2022-08-27 LAB — IRON, TOTAL/TOTAL IRON BINDING CAP
%SAT: 12 % (calc) — ABNORMAL LOW (ref 20–48)
Iron: 46 ug/dL — ABNORMAL LOW (ref 50–180)
TIBC: 380 mcg/dL (calc) (ref 250–425)

## 2022-08-27 MED ORDER — PANTOPRAZOLE SODIUM 40 MG PO TBEC: 40.0000 mg | DELAYED_RELEASE_TABLET | Freq: Every day | ORAL | 3 refills | Status: DC

## 2022-08-27 NOTE — Patient Instructions (Signed)
We will recheck your iron levels today Continue with twice daily iron pills for now Will restart protonix '40mg'$  daily (for acid reflux and previous GI bleeding) For constipation, Start taking Miralax 1 capful every day for one week. If bowel movements do not improve, increase to 1 capful every 12 hours. If after two weeks there is no improvement, increase to 1 capful every 8 hours Please let me know if you begin having worsening issues with hemorrhoids, try to avoid straining and limit toilet time to no more than 5 minutes We will get you referred to [facility] in [place] for further evaluation of the nodule previously found on your endoscopy, they should reach out to you soon to set up an appt  Follow up 3 months

## 2022-08-27 NOTE — Addendum Note (Signed)
Addended by: Harvel Quale on: 08/27/2022 01:06 PM   Modules accepted: Level of Service

## 2022-08-27 NOTE — Progress Notes (Addendum)
Referring Provider: [name], MD Primary Care Physician:  [name], MD Primary GI Physician: Jenetta Downer  Chief Complaint  Patient presents with   Anemia    3 month follow up on anemia and constipation. Takes laxatives every day and two stool softners, and half a tsp of miralax daily. Has a stool every 1 -2 days. Has dark stools. Takes iron twice daily.    HPI:   Lucas Dudley is a 81 y.o. male with past medical history of  atrial fibrillation on chronic anticoagulation, systolic CHF, hypertension, diabetes   Patient presenting today for follow up of IDA and constipation   History: Admitted to [facility] on 05/21/22 with worsening fatigue and sob on exertion. Reported some dark stools but no clear cut melena. Denied BRBPR, abdominal pain, nausea or vomiting. hgb 7.8 on admission, 7.4 at lowest , stool heme positive. Received 2 units PRBCs during admission. B12, folate and ferritin normal, serum iron and iron sat 18 and 5%,respectively. EGD during admission as below. Colonoscopy unremarkable. Capsule study as below, Small bowel nodule-junction distal duodenum/jejunum. Irregular with submucosal component. Suspicious in appearance. patient started on PO iron, hgb 9.2 on discharge 06/01/22.  At last visit in August, Patient states he is doing okay. Still having fatigue and weakness. having some dizziness upon standing and some shortness of breath on exertion. taking iron pills daily. No rectal bleeding or melena. denied abdominal pain, nausea or vomiting. taking stool softener, usually has a BM every other day with the need to strain when he goes. Cannot drink much water due to cardiac issues. Weight is stable.   Recommended to continue PO iron, PPI BID, CBC/Iron study repeat, start miralax, refer for EUS.  Plan per [facility] was to wait 2-3 months s/p Watchman procedure to perform EUS/enteroscopy.  Iron 25, TIBC 399, Sat 6% on 06/08/22, advised to increase iron to BID Most recent CBC 10/18  with hgb 10.5   Present:  Patient states his BP had been running in 180s at home, therefore he increased his lisinopril. BP is low today at 107/42, he reports some fatigue but denies dizziness.  He is still having some issues with constipation and feels that he is having to strain a lot still to have a BM. He is taking 2 stool softeners and 1/2 tsp. He is worried about taking more miralax as he does not want to have diarrhea. He is having a BM every other day, states that initially stools are hard initially but are soft after the initial stool passes. He states that stools are large in volume, dark green in color but denies rectal bleeding. No abdominal pain. States he may sit in the restroom for about an hour sometimes due to continued defecation. Denies any rectal pain, itching or burning. He reports that he thinks he may have some hemorrhoids that he is having to push back inside of him after his BMs. Weight is stable today. Appetite is decent, eats twice a day but feels that he is eating good. States he stopped protonix due to concern for side effects, though states he is having a lot of belching. Denies heartburn, no dysphagia or odynophagia.    Patient did watchman procedure in September, doing well since this. Currently on plavix.   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 Status post ablation. Small bowel nodule -junction distal duodenum/jejunum. Irregular with a submucosal component. Suspicious in appearance. 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  No active bleeding.  Capsule did not reach cecum. Last Colonoscopy:05/24/22- One 5 mm polyp in the sigmoid colon-hyperplastic - Diverticulosis in the descending colon and at the splenic flexure. Redundant and elongated colon. - The examination was otherwise normal on direct and retroflexion views. No  blood or clot in the lower GI tract. Given negative evaluation today, small bowel evaluation warranted. Last EGD 05/24/22- Normal esophagus. - Small hiatal hernia. Friable gastric mucosa. Couple of tiny gastric erosions the significance of which is uncertain. Status post gastric biopsy - Normal duodenal bulb, second portion of the duodenum and third portion of the duodenum.   Recommendations:    Past Medical History:  Diagnosis Date   Arthritis    Atrial fibrillation (HCC)    CAD (coronary artery disease)    a. Cath 03/17/15 showing 100% ostial D1, 50% prox LAD to mid LAD, 40% RPDA stenosis. Med rx. // Myoview 01/2020: EF 31 diffuse perfusion defect without reversibility (suspect artifact); reviewed with Dr. Aggie Cosier study felt to be low risk   Cataract    Mixed form OD   Chronic diastolic CHF (congestive heart failure) (HCC)    Diabetic retinopathy ([place])    NPDR OU   Essential hypertension    Glaucoma    POAG OU   History of gout    Hyperlipidemia    Hypertensive retinopathy    OU   Kidney stones    Melanoma of neck (HCC)    NICM (nonischemic cardiomyopathy) ([place])    OSA on CPAP 2012   Prostate cancer ([place])    Type II diabetes mellitus ([place])     Past Surgical History:  Procedure Laterality Date   ABDOMINAL HERNIA REPAIR     w/mesh   ATRIAL FIBRILLATION ABLATION N/A 06/06/2021   Procedure: ATRIAL FIBRILLATION ABLATION;  Surgeon: Lucas Grayer, MD;  Location: [place] [facility];  Service: Cardiovascular;  Laterality: N/A;   BIOPSY  05/24/2022   Procedure: BIOPSY;  Surgeon: Daneil Dolin, MD;  [facility];  Service: Endoscopy;;   BIOPSY  05/30/2022   Procedure: BIOPSY;  Surgeon: Daneil Dolin, MD;  [facility];  Service: Endoscopy;;   CARDIAC CATHETERIZATION N/A 03/17/2015   Procedure: Left Heart Cath and Coronary Angiography;  Surgeon: Lorretta Harp, MD;  Location: [place] [facility];  Service: Cardiovascular;  Laterality: N/A;    CARDIOVERSION N/A 08/09/2021   Procedure: CARDIOVERSION;  Surgeon: [name], MD;  Location: [place];  Service: Cardiovascular;  Laterality: N/A;   CARDIOVERSION N/A 11/10/2021   Procedure: CARDIOVERSION;  Surgeon: Berniece Salines, DO;  Location: [place];  Service: Cardiovascular;  Laterality: N/A;   carotid doppler  09/17/2008   rigt and left ICAs 0-49%;mildly  abnormal   CATARACT EXTRACTION Left 2020   Dr. Kathlen Mody   COLONOSCOPY N/A 05/16/2017   Procedure: COLONOSCOPY;  Surgeon: Rogene Houston, MD;  [facility];  Service: Endoscopy;  Laterality: N/A;  930   COLONOSCOPY WITH PROPOFOL N/A 05/24/2022   Procedure: COLONOSCOPY WITH PROPOFOL;  Surgeon: Daneil Dolin, MD;  [facility];  Service: Endoscopy;  Laterality: N/A;   DOPPLER ECHOCARDIOGRAPHY  05/25/2009   EF 50-55%,LA mildly dilated, LV function normal   ELECTROPHYSIOLOGIC STUDY N/A 04/05/2015   Procedure: Cardioversion;  Surgeon: Sanda Klein, MD;  Location: [place] [facility];  Service: Cardiovascular;  Laterality: N/A;   ELECTROPHYSIOLOGIC STUDY N/A 09/06/2015   Procedure: Atrial Fibrillation Ablation;  Surgeon: Lucas Grayer, MD;  Location: [place] [facility];  Service: Cardiovascular;  Laterality: N/A;   ELECTROPHYSIOLOGIC STUDY N/A 07/12/2016   redo afib ablation by Dr Rayann Heman   ENTEROSCOPY N/A 05/30/2022   Procedure: ENTEROSCOPY;  Surgeon: Daneil Dolin, MD;  [facility];  Service: Endoscopy;  Laterality: N/A;   ESOPHAGOGASTRODUODENOSCOPY (EGD) WITH PROPOFOL N/A 05/24/2022   Procedure: ESOPHAGOGASTRODUODENOSCOPY (EGD) WITH PROPOFOL;  Surgeon: Daneil Dolin, MD;  [facility];  Service: Endoscopy;  Laterality: N/A;   EXCISIONAL HEMORRHOIDECTOMY     "inside and out"   EYE SURGERY Left 2020   Cat Sx - Dr. Kathlen Mody   FINE NEEDLE ASPIRATION Right    knee; "drew ~ 1 quart off"   GIVENS CAPSULE STUDY N/A 05/25/2022   Procedure: GIVENS CAPSULE STUDY;  Surgeon: Eloise Harman, DO;  [facility];  Service: Endoscopy;  Laterality: N/A;   HERNIA REPAIR     LAPAROSCOPIC CHOLECYSTECTOMY     LEFT ATRIAL APPENDAGE OCCLUSION N/A 06/28/2022   Procedure: LEFT ATRIAL APPENDAGE OCCLUSION;  Surgeon: Sherren Mocha, MD;  Location: [place] [facility];  Service: Cardiovascular;  Laterality: N/A;   MELANOMA EXCISION Right    "neck"   NM MYOCAR PERF WALL MOTION  02/21/2012   EF 61% ,EXERCISE 7 METS. exercise stopped due to wheezing and shortness of breathe   POLYPECTOMY  05/16/2017   Procedure: POLYPECTOMY;  Surgeon: Rogene Houston, MD;  [facility];  Service: Endoscopy;;  colon   POLYPECTOMY  05/24/2022   Procedure: POLYPECTOMY;  Surgeon: Daneil Dolin, MD;  [facility];  Service: Endoscopy;;   PROSTATECTOMY     SHOULDER OPEN ROTATOR CUFF REPAIR Right X 2   SUBMUCOSAL TATTOO INJECTION  05/30/2022   Procedure: SUBMUCOSAL TATTOO INJECTION;  Surgeon: Daneil Dolin, MD;  [facility];  Service: Endoscopy;;   TEE WITHOUT CARDIOVERSION N/A 09/05/2015   Procedure: TRANSESOPHAGEAL ECHOCARDIOGRAM (TEE);  Surgeon: Sueanne Margarita, MD;  Location[facility];  Service: Cardiovascular;  Laterality: N/A;   TEE WITHOUT CARDIOVERSION N/A 06/28/2022   Procedure: TRANSESOPHAGEAL ECHOCARDIOGRAM (TEE);  Surgeon: Sherren Mocha, MD;  Location: [place] [facility];  Service: Cardiovascular;  Laterality: N/A;    Current Outpatient Medications  Medication Sig Dispense Refill   amiodarone (PACERONE) 200 MG tablet TAKE 1 TABLET BY MOUTH EVERY DAY 90 tablet 1   camphor-menthol (ANTI-ITCH) lotion Apply 1 Application topically daily as needed for itching.     carvedilol (COREG) 3.125 MG tablet Take 3.125 mg by mouth 2 (two) times daily with a meal.     clopidogrel (PLAVIX) 75 MG tablet Take 1 tablet (75 mg total) by mouth daily. START 10/29 AFTER TAKING LAST DOSE OF ELIQUIS 2.5 MG ON 10/28 90 tablet 3   COMBIGAN 0.2-0.5 % ophthalmic solution Apply 1 drop  to eye 2 (two) times daily.     CRANBERRY PO Take 1 tablet by mouth daily.     diphenhydramine-acetaminophen (TYLENOL PM) 25-500 MG TABS tablet Take 2 tablets by mouth at bedtime.     docusate sodium (COLACE) 100 MG capsule Take 1 capsule (100 mg total) by mouth daily. (Patient taking differently: Take 100 mg by mouth 2 (two) times daily.) 60 capsule 2   ferrous sulfate 325 (65 FE) MG tablet Take 1 tablet (325 mg total) by mouth 2 (two) times daily with a meal. 120 tablet 1   gemfibrozil (LOPID) 600 MG tablet Take 600 mg by  mouth at bedtime.      glimepiride (AMARYL) 2 MG tablet Take 2 mg by mouth at bedtime.     Insulin Glargine (BASAGLAR KWIKPEN) 100 UNIT/ML Inject 32 Units into the skin daily.     KLOR-CON M20 20 MEQ tablet TAKE 1 TABLET BY MOUTH EVERY DAY 90 tablet 1   lisinopril (ZESTRIL) 20 MG tablet Take 1 tablet (20 mg total) by mouth daily. 90 tablet 1   albuterol (VENTOLIN HFA) 108 (90 Base) MCG/ACT inhaler Inhale 2 puffs into the lungs every 4 (four) hours as needed for wheezing or shortness of breath. (Patient not taking: Reported on 08/27/2022)     apixaban (ELIQUIS) 2.5 MG TABS tablet Take 2.5 mg by mouth 2 (two) times daily. (Patient not taking: Reported on 08/27/2022)     Melatonin 10 MG TABS Take 10 mg by mouth at bedtime.     metFORMIN (GLUCOPHAGE-XR) 500 MG 24 hr tablet Take 1,000 mg by mouth in the morning and at bedtime.     mineral oil liquid Take 15 mLs by mouth daily as needed for moderate constipation.     Multiple Vitamins-Minerals (MULTIVITAMIN WITH MINERALS) tablet Take 1 tablet by mouth daily. Centrum men 50+     Omega-3 1000 MG CAPS Take 1,000 mg by mouth daily.     OZEMPIC, 1 MG/DOSE, 4 MG/3ML SOPN Inject 1 mg into the skin once a week. Saturday     torsemide (DEMADEX) 20 MG tablet Take 1 tablet (20 mg total) by mouth 2 (two) times daily. 180 tablet 1   valACYclovir (VALTREX) 1000 MG tablet Take 1,000 mg by mouth at bedtime.     Current Facility-Administered  Medications  Medication Dose Route Frequency Provider Last Rate Last Admin   0.9 %  sodium chloride infusion   Intravenous Continuous Sherren Mocha, MD       0.9 %  sodium chloride infusion   Intravenous Continuous Sherren Mocha, MD        Allergies as of 08/27/2022 - Review Complete 08/27/2022  Allergen Reaction Noted   Azithromycin Nausea Only 11/10/2021   Tape Rash and Other (See Comments) 01/28/2012    Family History  Problem Relation Age of Onset   Heart disease Mother    Lung cancer Mother    Heart attack Mother 8   Diabetes Father    Heart disease Father    Stroke Brother    Healthy Daughter    Colon cancer Maternal Aunt        25s    Social History   Socioeconomic History   Marital status: Widowed    Spouse name: Not on file   Number of children: 1   Years of education: Not on file   Highest education level: Not on file  Occupational History   Occupation: Retired  Tobacco Use   Smoking status: Former    Packs/day: 2.00    Years: 27.00    Total pack years: 54.00    Types: Cigarettes    Quit date: 1992    Years since quitting: 31.8   Smokeless tobacco: Never   Tobacco comments:    Former smoker 09/27/21  Vaping Use   Vaping Use: Never used  Substance and Sexual Activity   Alcohol use: No    Alcohol/week: 0.0 standard drinks of alcohol    Comment: "used to drink; stopped ~ 2008"   Drug use: No   Sexual activity: Not Currently  Other Topics Concern   Not on file  Social History  Narrative   Lives in Home, [place] with wife.   Social Determinants of Health   Financial Resource Strain: Not on file  Food Insecurity: No Food Insecurity (06/29/2022)   Hunger Vital Sign    Worried About Running Out of Food in the Last Year: Never true    Ran Out of Food in the Last Year: Never true  Transportation Needs: No Transportation Needs (06/29/2022)   PRAPARE - Hydrologist (Medical): No    Lack of Transportation (Non-Medical): No   Physical Activity: Not on file  Stress: Not on file  Social Connections: Not on file    Review of systems General: negative for malaise, night sweats, fever, chills, weight loss Neck: Negative for lumps, goiter, pain and significant neck swelling Resp: Negative for cough, wheezing, dyspnea at rest CV: Negative for chest pain, leg swelling, palpitations, orthopnea GI: denies melena, hematochezia, nausea, vomiting, diarrhea, dysphagia, odyonophagia, early satiety or unintentional weight loss. +constipation +possible hemorrhoids (grade 3) MSK: Negative for joint pain or swelling, back pain, and muscle pain. Derm: Negative for itching or rash Psych: Denies depression, anxiety, memory loss, confusion. No homicidal or suicidal ideation.  Heme: Negative for prolonged bleeding, bruising easily, and swollen nodes. Endocrine: Negative for cold or heat intolerance, polyuria, polydipsia and goiter. Neuro: negative for tremor, gait imbalance, syncope and seizures. The remainder of the review of systems is noncontributory.  Physical Exam: There were no vitals taken for this visit. General:   Alert and oriented. No distress noted. Pleasant and cooperative.  Head:  Normocephalic and atraumatic. Eyes:  Conjuctiva clear without scleral icterus. Mouth:  Oral mucosa pink and moist. Good dentition. No lesions. Heart: Normal rate and rhythm, s1 and s2 heart sounds present.  Lungs: Clear lung sounds in all lobes. Respirations equal and unlabored. Abdomen:  +BS, soft, non-tender and non-distended. No rebound or guarding. No HSM or masses noted. Rectal: patient declined rectal exam today  Derm: No palmar erythema or jaundice Msk:  Symmetrical without gross deformities. Normal posture. Extremities:  Without edema. Neurologic:  Alert and  oriented x4 Psych:  Alert and cooperative. Normal mood and affect.  Invalid input(s): "6 MONTHS"   ASSESSMENT: Lucas Dudley is a 81 y.o. male presenting today for  follow up of IDA and constipation  Constipation:previously advised to start miralax for constipation, currently doing 2 stool softeners daily and 1 tsp of miralax, he is having a BM usually every other day, but notes inititally needing to strain to get bowels moving, will have a large, soft BM thereafter, does note sitting on the toilet for quite some time and having what sounds like hemorrhoids prolapsing, these require manual reducing, patient did decline rectal for further evaluation. He denies rectal bleeding, itching, pain or burning. I recommend he stop stool softeners and Start taking Miralax 1 capful every day for one week. If bowel movements do not improve, increase to 1 capful every 12 hours. If after two weeks there is no improvement, increase to 1 capful every 8 hours. He should avoid straining, and limit toilet time to no more than 5 minutes. He will make me aware if hemorrhoids worsen.   IDA/Hx AVMs: last iron studies in August Iron 25, TIBC 399, Sat 6%, advised to increase iron to BID Most recent hgb 10.5 on 10/18. He has no rectal bleeding or melena, does note some SOB on exertion but unsure if this is related to IDA or in combination with known cardiac issues. He did  stop his PPI previously due to concern for side effects. Patient was re-assured regarding PPI use and safety of when appropriate indications in question. Most recent studies on PPI therapy that association of symptoms is not equivalent to causation and overall association with for example osteoporosis is weak and based on observational studies. When PPI use is indicated, it is safe to proceed with therapy and titrate dosing/use based on symptom response. Recommend repeat Iron studies, restart PPI daily and continue PO IRON BID for now.   Submucosal duodenal nodule:  Previously identified submucosal nodule on endoscopic evaluation earlier this year. Case was discussed by Dr. Jenetta Downer with Dr. Rush Landmark with [facility] who  recommended EUS/possible enteroscopy 2-3 months after watchman procedure which was done in September. Will place referral to [facility] for EUS/Possible Enteroscopy for further evaluation of this, ASAP.   Notably, BP was low at 107/42 today. Patient denied dizziness or lightheadedness, reportedly had some elevated BPs at home and had increased his lisinopril, I encouraged him to reach out to Dr. [name] for further evaluation/guidance on BP management.   PLAN:  Recheck iron levels  2. Continue with iron pills BID  3. Referral for EUS with [facility] 4. Start taking Miralax 1 capful every day for one week. If bowel movements do not improve, increase to 1 capful every 12 hours. If after two weeks there is no improvement, increase to 1 capful every 8 hours 5. Rx Protonix '40mg'$  daily  6. Avoid straining, limit toilet time, pt to make me aware of worsening issue with hemorrhoids  All questions were answered, patient verbalized understanding and is in agreement with plan as outlined above.   Follow Up: 3 months   Josiah Wojtaszek L. Alver Sorrow, MSN, APRN, AGNP-C Adult-Gerontology Nurse Practitioner [facility]  I have reviewed the note and agree with the APP's assessment as described in this progress note  Case previously discussed with Dr. Rush Landmark for evaluation of SB nodule with enteroscope and mini EUS probe.  Will resend referral for evaluation. Patient already underwent Watchman device placement.  Maylon Peppers, MD Gastroenterology and Hepatology [facility]

## 2022-08-27 NOTE — Telephone Encounter (Signed)
-----   Message from Irving Copas., MD sent at 08/27/2022  4:29 PM EST ----- Please schedule for December/January.  EGD/EUS.  Will need to get Plavix hold.  Thanks. GM  ----- Message ----- From: Timothy Lasso, RN Sent: 08/27/2022  10:36 AM EST To: Narda Amber., MD   ----- Message ----- From: Worthy Keeler Sent: 08/27/2022  10:22 AM EST To: Timothy Lasso, RN  Per Scherrie Gerlach, NP/Dr Montez Morita patient needs EUS for duodenal ulcer ASAP. Dr Rush Landmark is aware of the patient

## 2022-08-28 ENCOUNTER — Other Ambulatory Visit: Payer: Self-pay

## 2022-08-28 ENCOUNTER — Ambulatory Visit: Payer: Medicare Other | Admitting: Cardiology

## 2022-08-28 ENCOUNTER — Telehealth: Payer: Self-pay

## 2022-08-28 ENCOUNTER — Telehealth (HOSPITAL_COMMUNITY): Payer: Self-pay | Admitting: *Deleted

## 2022-08-28 DIAGNOSIS — K269 Duodenal ulcer, unspecified as acute or chronic, without hemorrhage or perforation: Secondary | ICD-10-CM

## 2022-08-28 NOTE — Telephone Encounter (Signed)
[facility] Pre-operative Risk Assessment     Request for surgical clearance:     Endoscopy Procedure  What type of surgery is being performed?     EGD EUS   When is this surgery scheduled?     10/01/22  What type of clearance is required ?   Pharmacy  Are there any medications that need to be held prior to surgery and how long? Plavix  Practice name and name of physician performing surgery?      [facility]  What is your office phone and fax number?      Phone- [phone]  [phone]  Anesthesia type (None, local, MAC, general) ?       MAC

## 2022-08-28 NOTE — Telephone Encounter (Signed)
EGD EUS has been scheduled for 10/01/22 at 12 pm at [facility] with GM   Left message on machine to call back   Plavix letter sent

## 2022-08-28 NOTE — Telephone Encounter (Signed)
Reaching out to patient to offer assistance regarding upcoming cardiac imaging study; pt verbalizes understanding of appt date/time, parking situation and where to check in, and verified current allergies; name and call back number provided for further questions should they arise  Gordy Clement RN Navigator Cardiac Imaging [facility] [phone] office [phone] cell  Patient to take his AM carvedilol two hours prior to his cardiac CT scan.  He is aware to arrive at 9:30am.

## 2022-08-29 ENCOUNTER — Ambulatory Visit (HOSPITAL_COMMUNITY)
Admission: RE | Admit: 2022-08-29 | Discharge: 2022-08-29 | Disposition: A | Discharge status: Home / Self Care | Payer: Medicare Other | Source: Ambulatory Visit | Attending: Cardiology | Admitting: Cardiology

## 2022-08-29 DIAGNOSIS — I480 Paroxysmal atrial fibrillation: Secondary | ICD-10-CM | POA: Diagnosis not present

## 2022-08-29 MED ORDER — IOHEXOL 350 MG/ML SOLN
100.0000 mL | Freq: Once | INTRAVENOUS | Status: AC | PRN
  Administered 2022-08-29: 100 mL via INTRAVENOUS

## 2022-08-29 MED ORDER — NITROGLYCERIN 0.4 MG SL SUBL: 0.8000 mg | SUBLINGUAL_TABLET | Freq: Once | SUBLINGUAL | Status: DC

## 2022-08-29 NOTE — Telephone Encounter (Signed)
EUS scheduled, pt instructed and medications reviewed.  Patient instructions mailed to home.  Patient to call with any questions or concerns.  The pt aware to let us if he does not hear from [place] in regards to plavix at least 2 weeks prior to the appt.

## 2022-08-29 NOTE — Telephone Encounter (Signed)
Thanks for reaching out. I would not hold his Plavix quite yet. He had his post Watchman CT scan that unfortunately showed a  2.2 mm medial gap. I am waiting for Dr. Quentin Ore to review and let me know what the antiplatelet or anticoagulation plan will be for him. Looks like he not scheduled until 12/18 for endo.    Thanks! Sharee Pimple

## 2022-09-05 NOTE — Progress Notes (Signed)
[facility] Note  09/10/2022    CHIEF COMPLAINT Patient presents for Retina Follow Up  HISTORY OF PRESENT ILLNESS: Lucas Dudley is a 81 y.o. male who presents to the clinic today for:  HPI     Retina Follow Up   Patient presents with  Diabetic Retinopathy.  In both eyes.  This started 4 weeks ago.  Severity is moderate.  Duration of 4 weeks.  Since onset it is stable.  I, the attending physician,  performed the HPI with the patient and updated documentation appropriately.        Comments   4 week DFE OCT pt states no vision changes noticed pt has floaters that are without changes he denies flashes of light pt last blood sugar 199 this am       Last edited by Bernarda Caffey, MD on 09/11/2022  8:56 AM.    Patient states the last 3 days his blood sugar has been high  Referring physician: [name], MD [place],  [place] [address]  HISTORICAL INFORMATION:   Selected notes from the MEDICAL RECORD NUMBER Referred by Dr. Kathlen Mody for DEE   CURRENT MEDICATIONS: Current Outpatient Medications (Ophthalmic Drugs)  Medication Sig   COMBIGAN 0.2-0.5 % ophthalmic solution Apply 1 drop to eye 2 (two) times daily.   No current facility-administered medications for this visit. (Ophthalmic Drugs)   Current Outpatient Medications (Other)  Medication Sig   albuterol (VENTOLIN HFA) 108 (90 Base) MCG/ACT inhaler Inhale 2 puffs into the lungs every 4 (four) hours as needed for wheezing or shortness of breath. (Patient not taking: Reported on 08/27/2022)   amiodarone (PACERONE) 200 MG tablet TAKE 1 TABLET BY MOUTH EVERY DAY   camphor-menthol (ANTI-ITCH) lotion Apply 1 Application topically daily as needed for itching.   carvedilol (COREG) 3.125 MG tablet Take 3.125 mg by mouth 2 (two) times daily with a meal.   clopidogrel (PLAVIX) 75 MG tablet Take 1 tablet (75 mg total) by mouth daily. START 10/29 AFTER TAKING LAST DOSE OF ELIQUIS 2.5 MG ON  10/28   CRANBERRY PO Take 1 tablet by mouth daily.   diphenhydramine-acetaminophen (TYLENOL PM) 25-500 MG TABS tablet Take 2 tablets by mouth at bedtime.   docusate sodium (COLACE) 100 MG capsule Take 1 capsule (100 mg total) by mouth daily. (Patient taking differently: Take 100 mg by mouth 2 (two) times daily.)   ferrous sulfate 325 (65 FE) MG tablet Take 1 tablet (325 mg total) by mouth 2 (two) times daily with a meal.   gemfibrozil (LOPID) 600 MG tablet Take 600 mg by mouth at bedtime.    glimepiride (AMARYL) 2 MG tablet Take 2 mg by mouth at bedtime.   Insulin Glargine (BASAGLAR KWIKPEN) 100 UNIT/ML Inject 32 Units into the skin daily.   KLOR-CON M20 20 MEQ tablet TAKE 1 TABLET BY MOUTH EVERY DAY   lisinopril (ZESTRIL) 20 MG tablet Take 1 tablet (20 mg total) by mouth daily.   Melatonin 10 MG TABS Take 10 mg by mouth at bedtime.   metFORMIN (GLUCOPHAGE-XR) 500 MG 24 hr tablet Take 1,000 mg by mouth in the morning and at bedtime.   mineral oil liquid Take 15 mLs by mouth daily as needed for moderate constipation.   Multiple Vitamins-Minerals (MULTIVITAMIN WITH MINERALS) tablet Take 1 tablet by mouth daily. Centrum men 50+   Omega-3 1000 MG CAPS Take 1,000 mg by mouth daily.   pantoprazole (PROTONIX) 40 MG tablet Take  1 tablet (40 mg total) by mouth daily.   torsemide (DEMADEX) 20 MG tablet Take 1 tablet (20 mg total) by mouth 2 (two) times daily.   valACYclovir (VALTREX) 1000 MG tablet Take 1,000 mg by mouth at bedtime.   Current Facility-Administered Medications (Other)  Medication Route   0.9 %  sodium chloride infusion Intravenous   0.9 %  sodium chloride infusion Intravenous   REVIEW OF SYSTEMS:    ALLERGIES Allergies  Allergen Reactions   Azithromycin Nausea Only   Tape Rash and Other (See Comments)    Causes skin redness, Use paper tape only.   PAST MEDICAL HISTORY Past Medical History:  Diagnosis Date   Arthritis    Atrial fibrillation (HCC)    CAD (coronary artery  disease)    a. Cath 03/17/15 showing 100% ostial D1, 50% prox LAD to mid LAD, 40% RPDA stenosis. Med rx. // Myoview 01/2020: EF 31 diffuse perfusion defect without reversibility (suspect artifact); reviewed with Dr. Aggie Cosier study felt to be low risk   Cataract    Mixed form OD   Chronic diastolic CHF (congestive heart failure) (HCC)    Diabetic retinopathy ([place])    NPDR OU   Essential hypertension    Glaucoma    POAG OU   History of gout    Hyperlipidemia    Hypertensive retinopathy    OU   Kidney stones    Melanoma of neck (HCC)    NICM (nonischemic cardiomyopathy) ([place])    OSA on CPAP 2012   Prostate cancer ([place])    Type II diabetes mellitus ([place])    Past Surgical History:  Procedure Laterality Date   ABDOMINAL HERNIA REPAIR     w/mesh   ATRIAL FIBRILLATION ABLATION N/A 06/06/2021   Procedure: ATRIAL FIBRILLATION ABLATION;  Surgeon: Thompson Grayer, MD;  Location: [facility];  Service: Cardiovascular;  Laterality: N/A;   BIOPSY  05/24/2022   Procedure: BIOPSY;  Surgeon: Daneil Dolin, MD;  [facility];  Service: Endoscopy;;   BIOPSY  05/30/2022   Procedure: BIOPSY;  Surgeon: Daneil Dolin, MD;  [facility];  Service: Endoscopy;;   CARDIAC CATHETERIZATION N/A 03/17/2015   Procedure: Left Heart Cath and Coronary Angiography;  Surgeon: Lorretta Harp, MD;  Location: [facility];  Service: Cardiovascular;  Laterality: N/A;   CARDIOVERSION N/A 08/09/2021   Procedure: CARDIOVERSION;  Surgeon: [name], MD;  Location: [facility];  Service: Cardiovascular;  Laterality: N/A;   CARDIOVERSION N/A 11/10/2021   Procedure: CARDIOVERSION;  Surgeon: Berniece Salines, DO;  Location: [facility];  Service: Cardiovascular;  Laterality: N/A;   carotid doppler  09/17/2008   rigt and left ICAs 0-49%;mildly  abnormal   CATARACT EXTRACTION Left 2020   Dr. Kathlen Mody   COLONOSCOPY N/A 05/16/2017   Procedure: COLONOSCOPY;  Surgeon: Rogene Houston, MD;   [facility];  Service: Endoscopy;  Laterality: N/A;  930   COLONOSCOPY WITH PROPOFOL N/A 05/24/2022   Procedure: COLONOSCOPY WITH PROPOFOL;  Surgeon: Daneil Dolin, MD;  [facility];  Service: Endoscopy;  Laterality: N/A;   DOPPLER ECHOCARDIOGRAPHY  05/25/2009   EF 50-55%,LA mildly dilated, LV function normal   ELECTROPHYSIOLOGIC STUDY N/A 04/05/2015   Procedure: Cardioversion;  Surgeon: Sanda Klein, MD;  Location: [facility];  Service: Cardiovascular;  Laterality: N/A;   ELECTROPHYSIOLOGIC STUDY N/A 09/06/2015   Procedure: Atrial Fibrillation Ablation;  Surgeon: Thompson Grayer, MD;  Location: [facility];  Service: Cardiovascular;  Laterality: N/A;   ELECTROPHYSIOLOGIC STUDY N/A 07/12/2016   redo afib ablation by Dr Rayann Heman   ENTEROSCOPY N/A 05/30/2022   Procedure: ENTEROSCOPY;  Surgeon: Daneil Dolin, MD;  [facility];  Service: Endoscopy;  Laterality: N/A;   ESOPHAGOGASTRODUODENOSCOPY (EGD) WITH PROPOFOL N/A 05/24/2022   Procedure: ESOPHAGOGASTRODUODENOSCOPY (EGD) WITH PROPOFOL;  Surgeon: Daneil Dolin, MD;  [facility];  Service: Endoscopy;  Laterality: N/A;   EXCISIONAL HEMORRHOIDECTOMY     "inside and out"   EYE SURGERY Left 2020   Cat Sx - Dr. Kathlen Mody   FINE NEEDLE ASPIRATION Right    knee; "drew ~ 1 quart off"   GIVENS CAPSULE STUDY N/A 05/25/2022   Procedure: GIVENS CAPSULE STUDY;  Surgeon: Eloise Harman, DO;  [facility];  Service: Endoscopy;  Laterality: N/A;   HERNIA REPAIR     LAPAROSCOPIC CHOLECYSTECTOMY     LEFT ATRIAL APPENDAGE OCCLUSION N/A 06/28/2022   Procedure: LEFT ATRIAL APPENDAGE OCCLUSION;  Surgeon: Sherren Mocha, MD;  Location: [facility];  Service: Cardiovascular;  Laterality: N/A;   MELANOMA EXCISION Right    "neck"   NM MYOCAR PERF WALL MOTION  02/21/2012   EF 61% ,EXERCISE 7 METS. exercise stopped due to wheezing and shortness of breathe   POLYPECTOMY  05/16/2017    Procedure: POLYPECTOMY;  Surgeon: Rogene Houston, MD;  [facility];  Service: Endoscopy;;  colon   POLYPECTOMY  05/24/2022   Procedure: POLYPECTOMY;  Surgeon: Daneil Dolin, MD;  [facility];  Service: Endoscopy;;   PROSTATECTOMY     SHOULDER OPEN ROTATOR CUFF REPAIR Right X 2   SUBMUCOSAL TATTOO INJECTION  05/30/2022   Procedure: SUBMUCOSAL TATTOO INJECTION;  Surgeon: Daneil Dolin, MD;  [facility];  Service: Endoscopy;;   TEE WITHOUT CARDIOVERSION N/A 09/05/2015   Procedure: TRANSESOPHAGEAL ECHOCARDIOGRAM (TEE);  Surgeon: Sueanne Margarita, MD;  Location: [facility];  Service: Cardiovascular;  Laterality: N/A;   TEE WITHOUT CARDIOVERSION N/A 06/28/2022   Procedure: TRANSESOPHAGEAL ECHOCARDIOGRAM (TEE);  Surgeon: Sherren Mocha, MD;  Location: [facility];  Service: Cardiovascular;  Laterality: N/A;   FAMILY HISTORY Family History  Problem Relation Age of Onset   Heart disease Mother    Lung cancer Mother    Heart attack Mother 93   Diabetes Father    Heart disease Father    Stroke Brother    Healthy Daughter    Colon cancer Maternal Aunt        39s   SOCIAL HISTORY Social History   Tobacco Use   Smoking status: Former    Packs/day: 2.00    Years: 27.00    Total pack years: 54.00    Types: Cigarettes    Quit date: 1992    Years since quitting: 31.9    Passive exposure: Current   Smokeless tobacco: Never   Tobacco comments:    Former smoker 09/27/21  Vaping Use   Vaping Use: Never used  Substance Use Topics   Alcohol use: No    Alcohol/week: 0.0 standard drinks of alcohol    Comment: "used to drink; stopped ~ 2008"   Drug use: No       OPHTHALMIC EXAM: Base Eye Exam     Visual Acuity (Snellen - Linear)       Right Left   Dist cc 20/40 -3 20/40 -1   Dist ph cc 20/40 -1 NI    Correction:  Glasses         Tonometry (Tonopen, 3:15 PM)       Right Left   Pressure 17 18         Pupils       Pupils Dark  Light Shape React APD   Right PERRL 2 1 Round Minimal None   Left PERRL 2 1 Round Minimal None         Visual Fields       Left Right    Full Full         Extraocular Movement       Right Left    Full, Ortho Full, Ortho         Neuro/Psych     Oriented x3: Yes   Mood/Affect: Normal         Dilation     Both eyes: 2.5% Phenylephrine @ 3:15 PM           Slit Lamp and Fundus Exam     Slit Lamp Exam       Right Left   Lids/Lashes Dermato, mild MGD Dermato, mild MGD   Conjunctiva/Sclera Temporal pinguecula, mild inferior sub conj heme Temporal pinguecula   Cornea EBMD, mild haze, trace PEE, mild Debris in tear film, well healed cataract wound trace haze, trace PEE, well healed temporal cataract wounds, arcus   Anterior Chamber Deep and clear; narrow temporal angle Deep and clear   Iris Round and dilated, mild anterior bowing, No NVI Round and moderately dilated to 5.54m   Lens PCIOL in good position, trace PCO PC IOL in good position with open PC   Anterior Vitreous Synerisis Synerisis         Fundus Exam       Right Left   Disc Mild pallor, sharp rim, +cupping, thin inferior rim Mild pallor, sharp rim, +cupping, +PPA   C/D Ratio 0.7 0.6   Macula Flat, blunted foveal reflex, cystic changes - persistent, RPE mottling and clumping, no heme Flat, blunted foveal reflex, clusters of fine MA nasal and superior to fovea -- improved, +cystic changes -- slightly improved   Vessels attenuated, Tortuous Attenuated, mild av crossing changes, mild tortuousity   Periphery Attached, rare MA, forcal DBH nasal to disc Attached. No heme.           Refraction     Wearing Rx       Sphere Cylinder Axis Add   Right -0.25 +2.00 158 +2.50   Left -1.00 +1.25 008 +2.50           IMAGING AND PROCEDURES  Imaging and Procedures for 09/10/2022  OCT, Retina - OU - Both Eyes       Right Eye Quality was good. Central Foveal Thickness: 442. Progression has been  stable. Findings include no SRF, abnormal foveal contour, intraretinal fluid, vitreomacular adhesion (Persistent central IRF -- minimal improvement).   Left Eye Quality was good. Central Foveal Thickness: 318. Progression has improved. Findings include no SRF, abnormal foveal contour, intraretinal hyper-reflective material, intraretinal fluid (Persistent IRF/IRHM centrally, slight improvement superiorly; partial PVD).   Notes *Images captured and stored on drive  Diagnosis / Impression:  DME OU OD: Persistent central IRF -- minimal improvement OS: Persistent IRF/IRHM centrally, slight improvement superiorly; partial PVD  Clinical management:  See below  Abbreviations: NFP - Normal foveal profile. CME - cystoid macular edema. PED - pigment epithelial detachment. IRF - intraretinal fluid. SRF - subretinal fluid. EZ - ellipsoid zone. ERM - epiretinal membrane. ORA -  outer retinal atrophy. ORT - outer retinal tubulation. SRHM - subretinal hyper-reflective material. IRHM - intraretinal hyper-reflective material     Intravitreal Injection, Pharmacologic Agent - OD - Right Eye       Time Out 09/10/2022. 3:45 PM. Confirmed correct patient, procedure, site, and patient consented.   Anesthesia Topical anesthesia was used. Anesthetic medications included Lidocaine 2%, Proparacaine 0.5%.   Procedure Preparation included 5% betadine to ocular surface, eyelid speculum. A (32g) needle was used.   Injection: 2 mg aflibercept 2 MG/0.05ML   Route: Intravitreal, Site: Right Eye   NDC: 83151-761-60, Lot: [id number], Expiration date: 01/12/2023, Waste: 0 mL   Post-op Post injection exam found visual acuity of at least counting fingers. The patient tolerated the procedure well. There were no complications. The patient received written and verbal post procedure care education.   Notes **SAMPLE MEDICATION ADMINISTERED**     Intravitreal Injection, Pharmacologic Agent - OS - Left Eye        Time Out 09/10/2022. 3:45 PM. Confirmed correct patient, procedure, site, and patient consented.   Anesthesia Topical anesthesia was used. Anesthetic medications included Lidocaine 2%, Proparacaine 0.5%.   Procedure Preparation included 5% betadine to ocular surface, eyelid speculum. A (32g) needle was used.   Injection: 6 mg faricimab-svoa 6 MG/0.05ML   Route: Intravitreal, Site: Left Eye   NDC: 54627-035-00, Lot: [id number], Expiration date: 09/13/2024, Waste: 0 mL   Post-op Post injection exam found visual acuity of at least counting fingers. The patient tolerated the procedure well. There were no complications. The patient received written and verbal post procedure care education. Post injection medications were not given.             ASSESSMENT/PLAN:   ICD-10-CM   1. 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 Essential hypertension  I10     3. Hypertensive retinopathy of both eyes  H35.033     4. Pseudophakia of both eyes  Z96.1     5. 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 History of herpes zoster of eye  Z86.19     7. Primary open angle glaucoma of both eyes, unspecified glaucoma stage  H40.1130       1. 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 Single focal MA superior to fovea; OS: Perifoveal Mas w/ late leakage - s/p IVA OS # 1(07.26.21), #2 (08.24.21), #3 (09.21.21), #4 (10.22.21) -- IVA resistance - s/p IVE OS #1 (11.22.21), #2 (12.21.21), #3 (01.20.22), #4 (02.18.22), #5 (03.18.22), #6 (04.15.22), #7 (05.10.22) -- sample, #8 (06.08.22),  #9 (07.12.22), #10 (08.12.22), #11 (09.16.22), #12 (10.21.22), #13 (11.18.22), #14 (12.21.22), #15 (01.20.23), #16 (02.17.23), #17 (03.24.23), #18 (04.21.23), #19 (05.26.23), #20 (06.23.23) -- IVE resistance - s/p IVV OS #1 (07.21.23), #2 (08.25.23), #3 (09.29.23), #4 (10.30.23) - s/p IVE OD #1 (09.29.23), #2 (10.30.23) - BCVA 20/40 OU -- decreased from 20/30 OU  - OCT shows OD: Persistent central IRF -- minimal improvement; OS: Persistent IRF/IRHM centrally, slight improvement superiorly; partial PVD at 4 wks  - recommend IVE OD #3 (sample due to expired insurance auth)  and IVV OS #5 today, 11.27.23 with f/u in 4 weeks  - pt wishes to proceed with injections  - RBA of procedure discussed, questions answered  - Vabysmo informed consent obtained and signed, 07.21.23 (OS) - see procedure note - IVA informed consent obtained and signed, 07.26.21 (OS) - IVE informed consent obtained and signed, 04.21.23 (OS) - IVE informed consent obtained and signed, 09.29.23 (OD) - Good Days approved until 10/14/22 - [employer] approved Vabysmo - need to get authorization for Eylea renewed - f/u 4 weeks -- DFE/OCT, possible injection  2,3. Hypertensive retinopathy OU - discussed importance of tight BP control - continue to monitor  4. Pseudophakia OU  - s/p CE/IOL OS (2020, Dr. Kathlen Mody)             - s/p CE/IOL OD (2022, Dr. Kathlen Mody)  - s/p YAG cap OS (04.04.23) - BCVA 20/25 from 20/150  - IOLs in good position, doing well  - Continue to monitor  5. 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 Susa Simmonds in August 2022  - s/p SuperK OS on 12.12.2022  6. History of herpes zoster/iritis OS  - on valtrex 1 g daily---maintenance  7. POAG OU  - was under the expert management of Dr. Kathlen Mody   - IOP 17,18 today, 11.27.23  - continue cosopt bid OU  Ophthalmic Meds Ordered this visit:  Meds ordered this encounter  Medications   faricimab-svoa (VABYSMO) '6mg'$ /0.6m intravitreal injection   aflibercept (EYLEA)  SOLN 2 mg     Return in about 4 weeks (around 10/08/2022) for f/u NPDR OU, DFE, OCT.  There are no Patient Instructions on file for this visit.  This document serves as a record of services personally performed by BGardiner Sleeper MD, PhD. It was created on their behalf by DRoselee Nova COMT. The creation of this record is the provider's dictation and/or activities during the visit.  Electronically signed by: DRoselee Nova COMT 09/11/22 8:57 AM  This document serves as a record of services personally performed by BGardiner Sleeper MD, PhD. It was created on their behalf by ASan Jetty BOwens Shark OA an ophthalmic technician. The creation of this record is the provider's dictation and/or activities during the visit.    Electronically signed by: ASan Jetty BOwens Shark OA '@TODAY'$ @ 8:57 AM   BGardiner Sleeper M.D., Ph.D. Diseases & Surgery of the Retina and Vitreous [facility] I have reviewed the above documentation for accuracy and completeness, and I agree with the above. BGardiner Sleeper M.D., Ph.D. 09/11/22 8:58 AM   Abbreviations: M myopia (nearsighted); A astigmatism; H hyperopia (farsighted); P presbyopia; Mrx spectacle prescription;  CTL contact lenses; OD right eye; OS left eye; OU both eyes  XT exotropia; ET esotropia; PEK punctate epithelial keratitis; PEE punctate epithelial erosions; DES dry eye syndrome; MGD meibomian gland dysfunction; ATs artificial tears; PFAT's preservative free artificial tears; NDadevillenuclear sclerotic cataract; PSC posterior subcapsular cataract; ERM epi-retinal membrane; PVD posterior vitreous detachment; RD retinal detachment; DM diabetes mellitus; DR diabetic retinopathy; NPDR non-proliferative diabetic retinopathy; PDR proliferative diabetic retinopathy; CSME clinically significant macular edema; DME diabetic macular edema; dbh dot blot hemorrhages; CWS cotton wool spot; POAG primary open angle glaucoma; C/D cup-to-disc ratio; HVF humphrey visual  field; GVF goldmann visual field; OCT optical coherence tomography; IOP intraocular pressure; BRVO Branch retinal vein occlusion; CRVO central retinal vein occlusion; CRAO central retinal artery occlusion; BRAO branch retinal artery occlusion; RT retinal tear; SB scleral buckle;  PPV pars plana vitrectomy; VH Vitreous hemorrhage; PRP panretinal laser photocoagulation; IVK intravitreal kenalog; VMT vitreomacular traction; MH Macular hole;  NVD neovascularization of the disc; NVE neovascularization elsewhere; AREDS age related eye disease study; ARMD age related macular degeneration; POAG primary open angle glaucoma; EBMD epithelial/anterior basement membrane dystrophy; ACIOL anterior chamber intraocular lens; IOL intraocular lens; PCIOL posterior chamber intraocular lens; Phaco/IOL phacoemulsification with intraocular lens placement; Anselmo photorefractive keratectomy; LASIK laser assisted in situ keratomileusis; HTN hypertension; DM diabetes mellitus; COPD chronic obstructive pulmonary disease

## 2022-09-10 ENCOUNTER — Encounter (INDEPENDENT_AMBULATORY_CARE_PROVIDER_SITE_OTHER): Payer: Self-pay | Admitting: Ophthalmology

## 2022-09-10 ENCOUNTER — Ambulatory Visit (INDEPENDENT_AMBULATORY_CARE_PROVIDER_SITE_OTHER): Payer: Medicare Other | Admitting: Ophthalmology

## 2022-09-10 DIAGNOSIS — E113213 Type 2 diabetes mellitus with mild nonproliferative diabetic retinopathy with macular edema, bilateral: Secondary | ICD-10-CM

## 2022-09-10 DIAGNOSIS — H18523 Epithelial (juvenile) corneal dystrophy, bilateral: Secondary | ICD-10-CM

## 2022-09-10 DIAGNOSIS — Z8619 Personal history of other infectious and parasitic diseases: Secondary | ICD-10-CM

## 2022-09-10 DIAGNOSIS — I10 Essential (primary) hypertension: Secondary | ICD-10-CM

## 2022-09-10 DIAGNOSIS — H401130 Primary open-angle glaucoma, bilateral, stage unspecified: Secondary | ICD-10-CM

## 2022-09-10 DIAGNOSIS — Z961 Presence of intraocular lens: Secondary | ICD-10-CM

## 2022-09-10 DIAGNOSIS — H35033 Hypertensive retinopathy, bilateral: Secondary | ICD-10-CM | POA: Diagnosis not present

## 2022-09-10 MED ORDER — FARICIMAB-SVOA 6 MG/0.05ML IZ SOLN
6.0000 mg | INTRAVITREAL | Status: AC | PRN
  Administered 2022-09-10: 6 mg via INTRAVITREAL

## 2022-09-10 MED ORDER — AFLIBERCEPT 2MG/0.05ML IZ SOLN FOR KALEIDOSCOPE
2.0000 mg | INTRAVITREAL | Status: AC | PRN
  Administered 2022-09-10: 2 mg via INTRAVITREAL

## 2022-09-10 NOTE — Telephone Encounter (Signed)
Hi Jill, I'm helping with preop box today. Can you please let us know if you have an update from your discussion of when he could hold Plavix, so we can relay an update to GI of when they might need to think about r/s to? His next f/u is otherwise not until 11/2022. Please reply to P [facility] PREOP. Thank you.

## 2022-09-11 ENCOUNTER — Encounter: Payer: Self-pay | Admitting: Hematology

## 2022-09-11 ENCOUNTER — Inpatient Hospital Stay: Payer: Medicare Other

## 2022-09-11 ENCOUNTER — Inpatient Hospital Stay: Payer: Medicare Other | Attending: Hematology | Admitting: Hematology

## 2022-09-11 VITALS — BP 143/63 | HR 55 | Temp 98.6°F | Resp 16 | Ht 70.0 in | Wt 239.5 lb

## 2022-09-11 DIAGNOSIS — R195 Other fecal abnormalities: Secondary | ICD-10-CM | POA: Insufficient documentation

## 2022-09-11 DIAGNOSIS — Z800 Family history of malignant neoplasm of digestive organs: Secondary | ICD-10-CM | POA: Diagnosis not present

## 2022-09-11 DIAGNOSIS — Z79899 Other long term (current) drug therapy: Secondary | ICD-10-CM | POA: Insufficient documentation

## 2022-09-11 DIAGNOSIS — I10 Essential (primary) hypertension: Secondary | ICD-10-CM | POA: Insufficient documentation

## 2022-09-11 DIAGNOSIS — Z87891 Personal history of nicotine dependence: Secondary | ICD-10-CM | POA: Diagnosis not present

## 2022-09-11 DIAGNOSIS — Z808 Family history of malignant neoplasm of other organs or systems: Secondary | ICD-10-CM | POA: Diagnosis not present

## 2022-09-11 DIAGNOSIS — D649 Anemia, unspecified: Secondary | ICD-10-CM | POA: Diagnosis present

## 2022-09-11 DIAGNOSIS — Z8249 Family history of ischemic heart disease and other diseases of the circulatory system: Secondary | ICD-10-CM | POA: Insufficient documentation

## 2022-09-11 DIAGNOSIS — Z801 Family history of malignant neoplasm of trachea, bronchus and lung: Secondary | ICD-10-CM | POA: Insufficient documentation

## 2022-09-11 DIAGNOSIS — Z823 Family history of stroke: Secondary | ICD-10-CM | POA: Insufficient documentation

## 2022-09-11 DIAGNOSIS — Z833 Family history of diabetes mellitus: Secondary | ICD-10-CM

## 2022-09-11 LAB — CBC WITH DIFFERENTIAL/PLATELET
Abs Immature Granulocytes: 0.03 10*3/uL (ref 0.00–0.07)
Basophils Absolute: 0 10*3/uL (ref 0.0–0.1)
Basophils Relative: 0 %
Eosinophils Absolute: 0.1 10*3/uL (ref 0.0–0.5)
Eosinophils Relative: 2 %
HCT: 36.5 % — ABNORMAL LOW (ref 39.0–52.0)
Hemoglobin: 11.9 g/dL — ABNORMAL LOW (ref 13.0–17.0)
Immature Granulocytes: 0 %
Lymphocytes Relative: 13 %
Lymphs Abs: 0.9 10*3/uL (ref 0.7–4.0)
MCH: 32.2 pg (ref 26.0–34.0)
MCHC: 32.6 g/dL (ref 30.0–36.0)
MCV: 98.9 fL (ref 80.0–100.0)
Monocytes Absolute: 0.5 10*3/uL (ref 0.1–1.0)
Monocytes Relative: 7 %
Neutro Abs: 5.2 10*3/uL (ref 1.7–7.7)
Neutrophils Relative %: 78 %
Platelets: 254 10*3/uL (ref 150–400)
RBC: 3.69 MIL/uL — ABNORMAL LOW (ref 4.22–5.81)
RDW: 14 % (ref 11.5–15.5)
WBC: 6.8 10*3/uL (ref 4.0–10.5)
nRBC: 0.6 % — ABNORMAL HIGH (ref 0.0–0.2)

## 2022-09-11 LAB — VITAMIN B12: Vitamin B-12: 162 pg/mL — ABNORMAL LOW (ref 180–914)

## 2022-09-11 LAB — IRON AND TIBC
Iron: 68 ug/dL (ref 45–182)
Saturation Ratios: 17 % — ABNORMAL LOW (ref 17.9–39.5)
TIBC: 391 ug/dL (ref 250–450)
UIBC: 323 ug/dL

## 2022-09-11 LAB — DIRECT ANTIGLOBULIN TEST (NOT AT ARMC)
DAT, IgG: NEGATIVE
DAT, complement: NEGATIVE

## 2022-09-11 LAB — LACTATE DEHYDROGENASE: LDH: 124 U/L (ref 98–192)

## 2022-09-11 LAB — RETICULOCYTES
Immature Retic Fract: 14.1 % (ref 2.3–15.9)
RBC.: 3.75 MIL/uL — ABNORMAL LOW (ref 4.22–5.81)
Retic Count, Absolute: 58.1 10*3/uL (ref 19.0–186.0)
Retic Ct Pct: 1.6 % (ref 0.4–3.1)

## 2022-09-11 LAB — FERRITIN: Ferritin: 33 ng/mL (ref 24–336)

## 2022-09-11 NOTE — Progress Notes (Signed)
CONSULT NOTE  Patient Care Team: [name], MD as PCP - General (Family Medicine) Thompson Grayer, MD as PCP - Electrophysiology (Cardiology) Satira Sark, MD as PCP - Cardiology (Cardiology)  CHIEF COMPLAINTS/PURPOSE OF CONSULTATION:  Normocytic anemia  HISTORY OF PRESENTING ILLNESS:  Lucas Dudley 81 y.o. male is seen at the request of Scherrie Gerlach, NP for further workup and management of normocytic anemia.  Patient was hospitalized in August and was found to have a hemoglobin of 7.6.  He had received 2 units of PRBC.  At that time he underwent EGD, colonoscopy which did not clearly show evidence of bleeding.  He also had enteroscopy which again did not identify a clear source of bleeding.  Upon discharge, he was started on iron tablet twice daily.  Recently started taking it with vitamin C.  He take stool softener and MiraLAX as it causes constipation.  He had 1 episode of black tarry stool about a month ago.  Otherwise he denies any bleeding per rectum or melena.  He was on Eliquis for 40 years until September when he had Watchman procedure done and is currently on Plavix.  He lives at home by himself and is independent of ADLs and IADLs.   MEDICAL HISTORY:  Past Medical History:  Diagnosis Date   Arthritis    Atrial fibrillation (HCC)    CAD (coronary artery disease)    a. Cath 03/17/15 showing 100% ostial D1, 50% prox LAD to mid LAD, 40% RPDA stenosis. Med rx. // Myoview 01/2020: EF 31 diffuse perfusion defect without reversibility (suspect artifact); reviewed with Dr. 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[place])    OSA on CPAP 2012    Prostate cancer ([place])    Type II diabetes mellitus ([place])     SURGICAL HISTORY: Past Surgical History:  Procedure Laterality Date   ABDOMINAL HERNIA REPAIR     w/mesh   ATRIAL FIBRILLATION ABLATION N/A 06/06/2021   Procedure: ATRIAL FIBRILLATION ABLATION;  Surgeon: Thompson Grayer, MD;  Location: [facility];  Service: Cardiovascular;  Laterality: N/A;   BIOPSY  05/24/2022   Procedure: BIOPSY;  Surgeon: Daneil Dolin, MD;  [facility];  Service: Endoscopy;;   BIOPSY  05/30/2022   Procedure: BIOPSY;  Surgeon: Daneil Dolin, MD;  [facility];  Service: Endoscopy;;   CARDIAC CATHETERIZATION N/A 03/17/2015   Procedure: Left Heart Cath and Coronary Angiography;  Surgeon: Lorretta Harp, MD;  Location: [facility];  Service: Cardiovascular;  Laterality: N/A;   CARDIOVERSION N/A 08/09/2021   Procedure: CARDIOVERSION;  Surgeon: [name], MD;  Location: [facility];  Service: Cardiovascular;  Laterality: N/A;   CARDIOVERSION N/A 11/10/2021   Procedure: CARDIOVERSION;  Surgeon: Berniece Salines, DO;  Location: [facility];  Service: Cardiovascular;  Laterality: N/A;   carotid doppler  09/17/2008   rigt and left ICAs 0-49%;mildly  abnormal   CATARACT EXTRACTION Left 2020   Dr. Kathlen Mody   COLONOSCOPY N/A 05/16/2017   Procedure: COLONOSCOPY;  Surgeon: Rogene Houston,  MD;  [facility];  Service: Endoscopy;  Laterality: N/A;  930   COLONOSCOPY WITH PROPOFOL N/A 05/24/2022   Procedure: COLONOSCOPY WITH PROPOFOL;  Surgeon: Daneil Dolin, MD;  [facility];  Service: Endoscopy;  Laterality: N/A;   DOPPLER ECHOCARDIOGRAPHY  05/25/2009   EF 50-55%,LA mildly dilated, LV function normal   ELECTROPHYSIOLOGIC STUDY N/A 04/05/2015   Procedure: Cardioversion;  Surgeon: Sanda Klein, MD;  Location: [facility];  Service: Cardiovascular;  Laterality: N/A;   ELECTROPHYSIOLOGIC STUDY N/A 09/06/2015   Procedure: Atrial Fibrillation Ablation;   Surgeon: Thompson Grayer, MD;  Location: [facility];  Service: Cardiovascular;  Laterality: N/A;   ELECTROPHYSIOLOGIC STUDY N/A 07/12/2016   redo afib ablation by Dr Rayann Heman   ENTEROSCOPY N/A 05/30/2022   Procedure: ENTEROSCOPY;  Surgeon: Daneil Dolin, MD;  [facility];  Service: Endoscopy;  Laterality: N/A;   ESOPHAGOGASTRODUODENOSCOPY (EGD) WITH PROPOFOL N/A 05/24/2022   Procedure: ESOPHAGOGASTRODUODENOSCOPY (EGD) WITH PROPOFOL;  Surgeon: Daneil Dolin, MD;  [facility];  Service: Endoscopy;  Laterality: N/A;   EXCISIONAL HEMORRHOIDECTOMY     "inside and out"   EYE SURGERY Left 2020   Cat Sx - Dr. Kathlen Mody   FINE NEEDLE ASPIRATION Right    knee; "drew ~ 1 quart off"   GIVENS CAPSULE STUDY N/A 05/25/2022   Procedure: GIVENS CAPSULE STUDY;  Surgeon: Eloise Harman, DO;  [facility];  Service: Endoscopy;  Laterality: N/A;   HERNIA REPAIR     LAPAROSCOPIC CHOLECYSTECTOMY     LEFT ATRIAL APPENDAGE OCCLUSION N/A 06/28/2022   Procedure: LEFT ATRIAL APPENDAGE OCCLUSION;  Surgeon: Sherren Mocha, MD;  Location: [facility];  Service: Cardiovascular;  Laterality: N/A;   MELANOMA EXCISION Right    "neck"   NM MYOCAR PERF WALL MOTION  02/21/2012   EF 61% ,EXERCISE 7 METS. exercise stopped due to wheezing and shortness of breathe   POLYPECTOMY  05/16/2017   Procedure: POLYPECTOMY;  Surgeon: Rogene Houston, MD;  [facility];  Service: Endoscopy;;  colon   POLYPECTOMY  05/24/2022   Procedure: POLYPECTOMY;  Surgeon: Daneil Dolin, MD;  [facility];  Service: Endoscopy;;   PROSTATECTOMY     SHOULDER OPEN ROTATOR CUFF REPAIR Right X 2   SUBMUCOSAL TATTOO INJECTION  05/30/2022   Procedure: SUBMUCOSAL TATTOO INJECTION;  Surgeon: Daneil Dolin, MD;  [facility];  Service: Endoscopy;;   TEE WITHOUT CARDIOVERSION N/A 09/05/2015   Procedure: TRANSESOPHAGEAL ECHOCARDIOGRAM (TEE);  Surgeon: Sueanne Margarita, MD;  Location:  [facility];  Service: Cardiovascular;  Laterality: N/A;   TEE WITHOUT CARDIOVERSION N/A 06/28/2022   Procedure: TRANSESOPHAGEAL ECHOCARDIOGRAM (TEE);  Surgeon: Sherren Mocha, MD;  Location: [facility];  Service: Cardiovascular;  Laterality: N/A;    SOCIAL HISTORY: Social History   Socioeconomic History   Marital status: Widowed    Spouse name: Not on file   Number of children: 1   Years of education: Not on file   Highest education level: Not on file  Occupational History   Occupation: Retired  Tobacco Use   Smoking status: Former    Packs/day: 2.00    Years: 27.00    Total pack years: 54.00    Types: Cigarettes    Quit date: 1992    Years since quitting: 31.9    Passive exposure: Current   Smokeless tobacco: Never   Tobacco comments:    Former  smoker 09/27/21  Vaping Use   Vaping Use: Never used  Substance and Sexual Activity   Alcohol use: No    Alcohol/week: 0.0 standard drinks of alcohol    Comment: "used to drink; stopped ~ 2008"   Drug use: No   Sexual activity: Not Currently  Other Topics Concern   Not on file  Social History Narrative   Lives in [place], [place] with wife.   Social Determinants of Health   Financial Resource Strain: Not on file  Food Insecurity: No Food Insecurity (09/11/2022)   Hunger Vital Sign    Worried About Running Out of Food in the Last Year: Never true    Ran Out of Food in the Last Year: Never true  Transportation Needs: No Transportation Needs (09/11/2022)   PRAPARE - Hydrologist (Medical): No    Lack of Transportation (Non-Medical): No  Physical Activity: Not on file  Stress: Not on file  Social Connections: Not on file  Intimate Partner Violence: Not At Risk (09/11/2022)   Humiliation, Afraid, Rape, and Kick questionnaire    Fear of Current or Ex-Partner: No    Emotionally Abused: No    Physically Abused: No    Sexually Abused: No    FAMILY HISTORY: Family History  Problem Relation  Age of Onset   Heart disease Mother    Lung cancer Mother    Heart attack Mother 60   Diabetes Father    Heart disease Father    Stroke Brother    Healthy Daughter    Colon cancer Maternal Aunt        59s    ALLERGIES:  is allergic to azithromycin and tape.  MEDICATIONS:  Current Outpatient Medications  Medication Sig Dispense Refill   albuterol (VENTOLIN HFA) 108 (90 Base) MCG/ACT inhaler Inhale 2 puffs into the lungs every 4 (four) hours as needed for wheezing or shortness of breath.     amiodarone (PACERONE) 200 MG tablet TAKE 1 TABLET BY MOUTH EVERY DAY 90 tablet 1   amLODipine (NORVASC) 10 MG tablet Take 10 mg by mouth at bedtime.     camphor-menthol (ANTI-ITCH) lotion Apply 1 Application topically daily as needed for itching.     carvedilol (COREG) 3.125 MG tablet Take 3.125 mg by mouth 2 (two) times daily with a meal.     clopidogrel (PLAVIX) 75 MG tablet Take 1 tablet (75 mg total) by mouth daily. START 10/29 AFTER TAKING LAST DOSE OF ELIQUIS 2.5 MG ON 10/28 90 tablet 3   COMBIGAN 0.2-0.5 % ophthalmic solution Apply 1 drop to eye 2 (two) times daily.     CRANBERRY PO Take 1 tablet by mouth daily.     diphenhydramine-acetaminophen (TYLENOL PM) 25-500 MG TABS tablet Take 2 tablets by mouth at bedtime.     docusate sodium (COLACE) 100 MG capsule Take 1 capsule (100 mg total) by mouth daily. (Patient taking differently: Take 100 mg by mouth 2 (two) times daily.) 60 capsule 2   ferrous sulfate 325 (65 FE) MG tablet Take 1 tablet (325 mg total) by mouth 2 (two) times daily with a meal. 120 tablet 1   gemfibrozil (LOPID) 600 MG tablet Take 600 mg by mouth at bedtime.      glimepiride (AMARYL) 2 MG tablet Take 2 mg by mouth at bedtime.     Insulin Glargine (BASAGLAR KWIKPEN) 100 UNIT/ML Inject 32 Units into the skin daily.     KLOR-CON M20 20 MEQ tablet TAKE 1  TABLET BY MOUTH EVERY DAY 90 tablet 1   lisinopril (ZESTRIL) 20 MG tablet Take 1 tablet (20 mg total) by mouth daily. 90  tablet 1   Melatonin 10 MG TABS Take 10 mg by mouth at bedtime.     metFORMIN (GLUCOPHAGE-XR) 500 MG 24 hr tablet Take 1,000 mg by mouth in the morning and at bedtime.     mineral oil liquid Take 15 mLs by mouth daily as needed for moderate constipation.     Multiple Vitamins-Minerals (MULTIVITAMIN WITH MINERALS) tablet Take 1 tablet by mouth daily. Centrum men 50+     Omega-3 1000 MG CAPS Take 1,000 mg by mouth daily.     pantoprazole (PROTONIX) 40 MG tablet Take 1 tablet (40 mg total) by mouth daily. 90 tablet 3   torsemide (DEMADEX) 20 MG tablet Take 1 tablet (20 mg total) by mouth 2 (two) times daily. 180 tablet 1   valACYclovir (VALTREX) 1000 MG tablet Take 1,000 mg by mouth at bedtime.     Current Facility-Administered Medications  Medication Dose Route Frequency Provider Last Rate Last Admin   0.9 %  sodium chloride infusion   Intravenous Continuous Sherren Mocha, MD       0.9 %  sodium chloride infusion   Intravenous Continuous Sherren Mocha, MD        REVIEW OF SYSTEMS:   Constitutional: Denies fevers, chills or abnormal night sweats Eyes: Denies blurriness of vision, double vision or watery eyes Ears, nose, mouth, throat, and face: Denies mucositis or sore throat Respiratory: Denies cough, dyspnea or wheezes Cardiovascular: Denies palpitation, chest discomfort or lower extremity swelling Gastrointestinal: Positive for constipation from iron intake. Skin: Denies abnormal skin rashes Lymphatics: Denies new lymphadenopathy or easy bruising Neurological:Denies numbness, tingling or new weaknesses Behavioral/Psych: Mood is stable, no new changes  All other systems were reviewed with the patient and are negative.  PHYSICAL EXAMINATION: ECOG PERFORMANCE STATUS: 1 - Symptomatic but completely ambulatory  Vitals:   09/11/22 1326  BP: (!) 143/63  Pulse: (!) 55  Resp: 16  Temp: 98.6 F (37 C)  SpO2: 96%   Filed Weights   09/11/22 1326  Weight: 239 lb 8 oz (108.6 kg)     GENERAL:alert, no distress and comfortable SKIN: skin color, texture, turgor are normal, no rashes or significant lesions EYES: normal, conjunctiva are pink and non-injected, sclera clear OROPHARYNX:no exudate, no erythema and lips, buccal mucosa, and tongue normal  NECK: supple, thyroid normal size, non-tender, without nodularity LYMPH:  no palpable lymphadenopathy in the cervical, axillary or inguinal LUNGS: clear to auscultation and percussion with normal breathing effort HEART: regular rate & rhythm and no murmurs and no lower extremity edema ABDOMEN:abdomen soft, non-tender and normal bowel sounds Musculoskeletal:no cyanosis of digits and no clubbing  PSYCH: alert & oriented x 3 with fluent speech NEURO: no focal motor/sensory deficits  LABORATORY DATA:  I have reviewed the data as listed Recent Results (from the past 2160 hour(s))  Basic metabolic panel     Status: Abnormal   Collection Time: 06/28/22  8:26 AM  Result Value Ref Range   Sodium 140 135 - 145 mmol/L   Potassium 3.8 3.5 - 5.1 mmol/L   Chloride 100 98 - 111 mmol/L   CO2 26 22 - 32 mmol/L   Glucose, Bld 198 (H) 70 - 99 mg/dL    Comment: Glucose reference range applies only to samples taken after fasting for at least 8 hours.   BUN 23 8 - 23 mg/dL  Creatinine, Ser 1.15 0.61 - 1.24 mg/dL   Calcium 9.3 8.9 - 10.3 mg/dL   GFR, Estimated >60 >60 mL/min    Comment: (NOTE) Calculated using the CKD-EPI Creatinine Equation (2021)    Anion gap 14 5 - 15    Comment: Performed at [facility] [address], [place], [place] [address]  CBC     Status: Abnormal   Collection Time: 06/28/22  8:26 AM  Result Value Ref Range   WBC 7.1 4.0 - 10.5 K/uL   RBC 3.93 (L) 4.22 - 5.81 MIL/uL   Hemoglobin 10.9 (L) 13.0 - 17.0 g/dL   HCT 34.9 (L) 39.0 - 52.0 %   MCV 88.8 80.0 - 100.0 fL   MCH 27.7 26.0 - 34.0 pg   MCHC 31.2 30.0 - 36.0 g/dL   RDW 23.4 (H) 11.5 - 15.5 %   Platelets 214 150 - 400 K/uL   nRBC 0.0 0.0 -  0.2 %    Comment: Performed at [facility], [place] [address], [place], [place] [address]  Glucose, capillary     Status: Abnormal   Collection Time: 06/28/22  8:44 AM  Result Value Ref Range   Glucose-Capillary 195 (H) 70 - 99 mg/dL    Comment: Glucose reference range applies only to samples taken after fasting for at least 8 hours.   Comment 1 Notify RN    Comment 2 Document in Chart   Type and screen     Status: None   Collection Time: 06/28/22  9:01 AM  Result Value Ref Range   ABO/RH(D) A POS    Antibody Screen NEG    Sample Expiration      07/01/2022,2359 Performed at [facility], [place] [address], [place], [place] [address]   Surgical pcr screen     Status: None   Collection Time: 06/28/22  9:50 AM   Specimen: Nasal Mucosa; Nasal Swab  Result Value Ref Range   MRSA, PCR NEGATIVE NEGATIVE   Staphylococcus aureus NEGATIVE NEGATIVE    Comment: (NOTE) The Xpert SA Assay (FDA approved for NASAL specimens in patients 32 years of age and older), is one component of a comprehensive surveillance program. It is not intended to diagnose infection nor to guide or monitor treatment. Performed at [facility], [place] [name] [place], [place] [address]   Glucose, capillary     Status: Abnormal   Collection Time: 06/28/22 11:08 AM  Result Value Ref Range   Glucose-Capillary 174 (H) 70 - 99 mg/dL    Comment: Glucose reference range applies only to samples taken after fasting for at least 8 hours.   Comment 1 Notify RN    Comment 2 Document in Chart   POCT Activated clotting time     Status: None   Collection Time: 06/28/22 12:34 PM  Result Value Ref Range   Activated Clotting Time 245 seconds    Comment: Reference range 74-137 seconds for patients not on anticoagulant therapy.  POCT Activated clotting time     Status: None   Collection Time: 06/28/22 12:46 PM  Result Value Ref Range   Activated Clotting Time 245 seconds    Comment: Reference range 74-137 seconds  for patients not on anticoagulant therapy.  Glucose, capillary     Status: Abnormal   Collection Time: 06/28/22  1:48 PM  Result Value Ref Range   Glucose-Capillary 163 (H) 70 - 99 mg/dL    Comment: Glucose reference range applies only to  samples taken after fasting for at least 8 hours.  Glucose, capillary     Status: Abnormal   Collection Time: 06/28/22  3:56 PM  Result Value Ref Range   Glucose-Capillary 169 (H) 70 - 99 mg/dL    Comment: Glucose reference range applies only to samples taken after fasting for at least 8 hours.  Glucose, capillary     Status: Abnormal   Collection Time: 06/28/22  9:55 PM  Result Value Ref Range   Glucose-Capillary 285 (H) 70 - 99 mg/dL    Comment: Glucose reference range applies only to samples taken after fasting for at least 8 hours.   Comment 1 Notify RN   Glucose, capillary     Status: Abnormal   Collection Time: 06/29/22  6:38 AM  Result Value Ref Range   Glucose-Capillary 145 (H) 70 - 99 mg/dL    Comment: Glucose reference range applies only to samples taken after fasting for at least 8 hours.   Comment 1 Notify RN   CBC with Differential/Platelet     Status: Abnormal   Collection Time: 07/25/22 11:05 AM  Result Value Ref Range   WBC 8.9 3.8 - 10.8 Thousand/uL   RBC 3.55 (L) 4.20 - 5.80 Million/uL   Hemoglobin 10.5 (L) 13.2 - 17.1 g/dL   HCT 32.6 (L) 38.5 - 50.0 %   MCV 91.8 80.0 - 100.0 fL   MCH 29.6 27.0 - 33.0 pg   MCHC 32.2 32.0 - 36.0 g/dL   RDW 22.3 (H) 11.0 - 15.0 %   Platelets 272 140 - 400 Thousand/uL   MPV 11.7 7.5 - 12.5 fL   Neutro Abs 7,004 1,500 - 7,800 cells/uL   Lymphs Abs 908 850 - 3,900 cells/uL   Absolute Monocytes 703 200 - 950 cells/uL   Eosinophils Absolute 240 15 - 500 cells/uL   Basophils Absolute 45 0 - 200 cells/uL   Neutrophils Relative % 78.7 %   Total Lymphocyte 10.2 %   Monocytes Relative 7.9 %   Eosinophils Relative 2.7 %   Basophils Relative 0.5 %  CBC MORPHOLOGY     Status: None   Collection Time:  07/25/22 11:05 AM  Result Value Ref Range   CBC MORPHOLOGY  NORMAL    Comment: Anisocytosis 1 + Macrocytosis 1 + Hypochromasia 1 +   CBC     Status: Abnormal   Collection Time: 08/01/22 11:23 AM  Result Value Ref Range   WBC 6.4 3.4 - 10.8 x10E3/uL   RBC 3.52 (L) 4.14 - 5.80 x10E6/uL   Hemoglobin 10.5 (L) 13.0 - 17.7 g/dL   Hematocrit 32.6 (L) 37.5 - 51.0 %   MCV 93 79 - 97 fL   MCH 29.8 26.6 - 33.0 pg   MCHC 32.2 31.5 - 35.7 g/dL   RDW 19.9 (H) 11.6 - 15.4 %   Platelets 313 150 - 450 x10E3/uL  Comp Met (CMET)     Status: Abnormal   Collection Time: 08/01/22 11:23 AM  Result Value Ref Range   Glucose 218 (H) 70 - 99 mg/dL   BUN 22 8 - 27 mg/dL   Creatinine, Ser 1.32 (H) 0.76 - 1.27 mg/dL   eGFR 55 (L) >59 mL/min/1.73   BUN/Creatinine Ratio 17 10 - 24   Sodium 142 134 - 144 mmol/L   Potassium 5.2 3.5 - 5.2 mmol/L   Chloride 98 96 - 106 mmol/L   CO2 32 (H) 20 - 29 mmol/L   Calcium 9.4 8.6 - 10.2 mg/dL  Total Protein 6.9 6.0 - 8.5 g/dL   Albumin 4.2 3.8 - 4.8 g/dL   Globulin, Total 2.7 1.5 - 4.5 g/dL   Albumin/Globulin Ratio 1.6 1.2 - 2.2   Bilirubin Total <0.2 0.0 - 1.2 mg/dL   Alkaline Phosphatase 75 44 - 121 IU/L   AST 15 0 - 40 IU/L   ALT 11 0 - 44 IU/L  HgB A1c     Status: Abnormal   Collection Time: 08/01/22 11:23 AM  Result Value Ref Range   Hgb A1c MFr Bld 6.7 (H) 4.8 - 5.6 %    Comment:          Prediabetes: 5.7 - 6.4          Diabetes: >6.4          Glycemic control for adults with diabetes: <7.0    Est. average glucose Bld gHb Est-mCnc 146 mg/dL  Iron, Total/Total Iron Binding Cap     Status: Abnormal   Collection Time: 08/27/22 10:27 AM  Result Value Ref Range   Iron 46 (L) 50 - 180 mcg/dL   TIBC 380 250 - 425 mcg/dL (calc)   %SAT 12 (L) 20 - 48 % (calc)  CBC with Differential/Platelet     Status: Abnormal   Collection Time: 09/11/22  2:27 PM  Result Value Ref Range   WBC 6.8 4.0 - 10.5 K/uL   RBC 3.69 (L) 4.22 - 5.81 MIL/uL   Hemoglobin 11.9 (L)  13.0 - 17.0 g/dL   HCT 36.5 (L) 39.0 - 52.0 %   MCV 98.9 80.0 - 100.0 fL   MCH 32.2 26.0 - 34.0 pg   MCHC 32.6 30.0 - 36.0 g/dL   RDW 14.0 11.5 - 15.5 %   Platelets 254 150 - 400 K/uL   nRBC 0.6 (H) 0.0 - 0.2 %   Neutrophils Relative % 78 %   Neutro Abs 5.2 1.7 - 7.7 K/uL   Lymphocytes Relative 13 %   Lymphs Abs 0.9 0.7 - 4.0 K/uL   Monocytes Relative 7 %   Monocytes Absolute 0.5 0.1 - 1.0 K/uL   Eosinophils Relative 2 %   Eosinophils Absolute 0.1 0.0 - 0.5 K/uL   Basophils Relative 0 %   Basophils Absolute 0.0 0.0 - 0.1 K/uL   Immature Granulocytes 0 %   Abs Immature Granulocytes 0.03 0.00 - 0.07 K/uL    Comment: Performed at [facility], [address] [place], [place] [address]  Lactate dehydrogenase     Status: None   Collection Time: 09/11/22  2:27 PM  Result Value Ref Range   LDH 124 98 - 192 U/L    Comment: Performed at [facility], [address], [place], [place] [address]  Reticulocytes     Status: Abnormal   Collection Time: 09/11/22  2:27 PM  Result Value Ref Range   Retic Ct Pct 1.6 0.4 - 3.1 %   RBC. 3.75 (L) 4.22 - 5.81 MIL/uL   Retic Count, Absolute 58.1 19.0 - 186.0 K/uL   Immature Retic Fract 14.1 2.3 - 15.9 %    Comment: Performed at [facility], [address], [place], [place] [address]  Direct antiglobulin test     Status: None (Preliminary result)   Collection Time: 09/11/22  2:27 PM  Result Value Ref Range   DAT, complement PENDING    DAT, IgG      NEG Performed at [facility], [address], [place], [place] [address]     RADIOGRAPHIC STUDIES: I  have personally reviewed the radiological images as listed and agreed with the findings in the report. Intravitreal Injection, Pharmacologic Agent - OD - Right Eye  Result Date: 09/10/2022 Time Out 09/10/2022. 3:45 PM. Confirmed correct patient, procedure, site, and patient consented. Anesthesia Topical anesthesia was used. Anesthetic medications included Lidocaine 2%, Proparacaine 0.5%. Procedure  Preparation included 5% betadine to ocular surface, eyelid speculum. A (32g) needle was used. Injection: 2 mg aflibercept 2 MG/0.05ML   Route: Intravitreal, Site: Right Eye   NDC: 23762-831-51, Lot: [id number], Expiration date: 01/12/2023, Waste: 0 mL Post-op Post injection exam found visual acuity of at least counting fingers. The patient tolerated the procedure well. There were no complications. The patient received written and verbal post procedure care education. Notes **SAMPLE MEDICATION ADMINISTERED**  Intravitreal Injection, Pharmacologic Agent - OS - Left Eye  Result Date: 09/10/2022 Time Out 09/10/2022. 3:45 PM. Confirmed correct patient, procedure, site, and patient consented. Anesthesia Topical anesthesia was used. Anesthetic medications included Lidocaine 2%, Proparacaine 0.5%. Procedure Preparation included 5% betadine to ocular surface, eyelid speculum. A (32g) needle was used. Injection: 6 mg faricimab-svoa 6 MG/0.05ML   Route: Intravitreal, Site: Left Eye   NDC: 26948-546-27, Lot: [id number], Expiration date: 09/13/2024, Waste: 0 mL Post-op Post injection exam found visual acuity of at least counting fingers. The patient tolerated the procedure well. There were no complications. The patient received written and verbal post procedure care education. Post injection medications were not given.   OCT, Retina - OU - Both Eyes  Result Date: 09/10/2022 Right Eye Quality was good. Central Foveal Thickness: 442. Progression has been stable. Findings include no SRF, abnormal foveal contour, intraretinal fluid, vitreomacular adhesion (Persistent central IRF -- minimal improvement). Left Eye Quality was good. Central Foveal Thickness: 318. Progression has improved. Findings include no SRF, abnormal foveal contour, intraretinal hyper-reflective material, intraretinal fluid (Persistent IRF/IRHM centrally, slight improvement superiorly; partial PVD). Notes *Images captured and stored on drive Diagnosis /  Impression: DME OU OD: Persistent central IRF -- minimal improvement OS: Persistent IRF/IRHM centrally, slight improvement superiorly; partial PVD Clinical management: See below Abbreviations: NFP - Normal foveal profile. CME - cystoid macular edema. PED - pigment epithelial detachment. IRF - intraretinal fluid. SRF - subretinal fluid. EZ - ellipsoid zone. ERM - epiretinal membrane. ORA - outer retinal atrophy. ORT - outer retinal tubulation. SRHM - subretinal hyper-reflective material. IRHM - intraretinal hyper-reflective material  CT CARDIAC MORPH/PULM VEIN W/CM&W/O CA SCORE  Addendum Date: 08/29/2022   ADDENDUM REPORT: 08/29/2022 12:52 EXAM: OVER-READ INTERPRETATION CARDIAC CT CHEST The following report is an over-read performed by radiologist Dr. Van Clines of [facility], [place] on 08/29/2022. This over-read does not include interpretation of cardiac or coronary anatomy or pathology. The coronary CTA interpretation by the cardiologist is attached. COMPARISON:  05/31/2021 FINDINGS: Extracardiac Vascular: Descending thoracic aortic atherosclerotic vascular disease. Mediastinum: Prominence of fatty tissues in the anterior mediastinum and pericardial region compatible with mediastinal lipomatosis. Conglomerate subcarinal adenopathy 1.6 cm in short axis on image 20 series 10, mildly prominent, previously 2.1 cm on 05/31/2021. Lung: Scattered mild scarring or atelectasis and peripheral subpleural reticulation unchanged from prior, cannot exclude mild fibrosis. Included Upper Abdomen: Unremarkable Musculoskeletal: Multiple subacute right lateral rib fractures are healing. Old left lateral lower rib fractures. IMPRESSION: 1. Mildly prominent subcarinal lymph node, 1.6 cm in short axis, previously 2.1 cm on 05/31/2021. This is nonspecific but could be reactive. 2. Mild peripheral subpleural reticulation in the lungs, cannot exclude mild fibrosis. 3. Healing subacute right lateral rib fractures. Old  left  lateral lower rib fractures. 4. Scattered mild scarring or atelectasis and peripheral subpleural reticulation, unchanged from prior, cannot exclude mild fibrosis. 5. Descending thoracic aortic atherosclerotic vascular disease. 6. Mild peripheral scarring or atelectasis in both lungs. 7. Mild mediastinal lipomatosis. This is a benign incidental finding. 8. Aortic atherosclerosis. Aortic Atherosclerosis (ICD10-I70.0). Electronically Signed   By: Van Clines M.D.   On: 08/29/2022 12:52   Result Date: 08/29/2022 CLINICAL DATA:  Post Watchman EXAM: Cardiac Gated CTA TECHNIQUE: The patient was scanned on a Siemens Force 578 slice scanner. Gantry rotation speed was 250 msec with a temporal resolution of 66 msec. A prospective scan was triggered in the ascending thoracic aorta at 140 HU's Data sets were reconstructed with full mA between 35% and 75% of the R-R interval Images were reviewed using VRT, MIP and MPR modes. Double oblique images were used to measure the PV diameter and areas. The patient received 80 cc of contrast at 5 cc/sec CONTRAST:  Isovue 370 total 80 cc COMPARISON:  06/01/21 FINDINGS: Mild bi atrial enlargement No residual PFO/ASD post trans septal puncture No pericardial effusion Normal ascending thoracic aorta 3.4 cm. Normal PV anatomy There is a 31 mm Watchman FLX in the LAA. There is a 1/3 rd medial shoulder. The surface is well endothelialized. There is a 2.2 mm gap along the medial edge Of the device. The distal third of the device and LAA accepts contrast without full thrombosis of the appendage Average compression of the device is 15% IMPRESSION: 1.  Mild bi atrial enlargement 2.  Trans septal puncture site sealed with no shunt 3. 31 mm Watchman FLX with 1/3 medial shoulder and 2.2 mm medial gap. Device is well endothelialized at surface. Average compression 15% Distal 1/3 of device and LAA not thrombosed and accepts contrast 4.  No pericardial effusion 5.  Normal ascending thoracic aorta  3.4 cm 6.  Normal PV anatomy Electronically Signed: By: Jenkins Rouge M.D. On: 08/29/2022 11:24   Intravitreal Injection, Pharmacologic Agent - OD - Right Eye  Result Date: 08/13/2022 Time Out 08/13/2022. 3:30 PM. Confirmed correct patient, procedure, site, and patient consented. Anesthesia Topical anesthesia was used. Anesthetic medications included Lidocaine 2%, Proparacaine 0.5%. Procedure Preparation included 5% betadine to ocular surface, eyelid speculum. A (32g) needle was used. Injection: 2 mg aflibercept 2 MG/0.05ML   Route: Intravitreal, Site: Right Eye   NDC: A3590391, Lot: [id number], Expiration date: 11/15/2023, Waste: 0 mL Post-op Post injection exam found visual acuity of at least counting fingers. The patient tolerated the procedure well. There were no complications. The patient received written and verbal post procedure care education.   Intravitreal Injection, Pharmacologic Agent - OS - Left Eye  Result Date: 08/13/2022 Time Out 08/13/2022. 3:31 PM. Confirmed correct patient, procedure, site, and patient consented. Anesthesia Topical anesthesia was used. Anesthetic medications included Lidocaine 2%, Proparacaine 0.5%. Procedure Preparation included 5% betadine to ocular surface, eyelid speculum. A (32g) needle was used. Injection: 6 mg faricimab-svoa 6 MG/0.05ML   Route: Intravitreal, Site: Left Eye   NDC: [id number], Lot: [id number], Expiration date: 09/13/2024, Waste: 0 mL Post-op Post injection exam found visual acuity of at least counting fingers. The patient tolerated the procedure well. There were no complications. The patient received written and verbal post procedure care education. Post injection medications were not given.   OCT, Retina - OU - Both Eyes  Result Date: 08/13/2022 Right Eye Quality was good. Central Foveal Thickness: 434. Progression has improved. Findings include no SRF, abnormal foveal  contour, intraretinal fluid, vitreomacular adhesion (interval decrease  in central IRF). Left Eye Quality was good. Central Foveal Thickness: 318. Progression has improved. Findings include no SRF, abnormal foveal contour, intraretinal hyper-reflective material, intraretinal fluid (Mild interval improvement in IRF/IRHM superior fovea; partial PVD). 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 CME - cystoid macular edema. PED - pigment epithelial detachment. IRF - intraretinal fluid. SRF - subretinal fluid. EZ - ellipsoid zone. ERM - epiretinal membrane. ORA - outer retinal atrophy. ORT - outer retinal tubulation. SRHM - subretinal hyper-reflective material. IRHM - intraretinal hyper-reflective material   ASSESSMENT:  1.  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  Small bowel nodule-junction distal duodenum/jejunum.  Irregular with a submucosal component, suspicious in appearance.  Pathology was benign. - Colonoscopy (05/24/2022): 5 mm polyp in the sigmoid colon, diverticulosis in the descending colon and at the splenic flexure.  Pathology-hyperplastic polyp. - EGD (05/24/2022): Normal esophagus, small hiatal hernia, friable gastric mucosa, couple of tiny gastric erosions, normal duodenal bulb and second part of duodenum. - He was on Eliquis for about 4 years until September 2023 when he had Watchman procedure done.  Since then he is on Plavix daily. - He reports 1 episode of tarry stool in October 2023. - He is taking iron tablet twice daily with vitamin C since his discharge from the hospital in August 2023.  2.  Social/family history: - Lives at home by himself.  He is seen today with his daughter [name].  He is independent of ADLs and IADLs.  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  He has exposure to torrent chemicals.  He smoked 1 pack/day for 35 years and quit smoking in 1992. - No family history of significant anemia. - Mother had mesothelioma.  Maternal aunt had colon cancer.  Maternal uncle had adrenal gland cancer.   PLAN:  1.  Normocytic anemia: - We have reviewed all his previous hospitalization records and endoscopy records. - Would recommend repeating his CBC today.  Will check for nutritional deficiencies including ferritin, iron panel, B12, folate, MMA, copper.  Will check for hemolysis with haptoglobin, LDH, reticulocyte count and Coombs test. - Will check for bone marrow infiltrative process with SPEP and free light chains.  Will also check stool for occult blood x 3. - RTC 2 weeks for follow-up.  If he continues to have low iron, will consider parenteral iron therapy.   All questions were answered. The patient knows to call the clinic with any problems, questions or concerns.      Lucas Jack, MD 09/11/22 4:23 PM

## 2022-09-11 NOTE — Patient Instructions (Addendum)
[facility]  Discharge Instructions  You were seen and examined today by Dr. Delton Coombes. He is a Merchandiser, retail meaning that he specializes in the treatment of blood disorders and cancers. He discussed your medical history and the events that led up to you being here today.  You were referred to Dr. Delton Coombes due to anemia. Dr. Delton Coombes has recommended additional labs today for further assessment.  Dr. Delton Coombes has also requested stool samples.  Follow-up as scheduled.  Thank you for choosing [facility] to provide your oncology and hematology care.   To afford each patient quality time with our provider, please arrive at least 15 minutes before your scheduled appointment time. You may need to reschedule your appointment if you arrive late (10 or more minutes). Arriving late affects you and other patients whose appointments are after yours.  Also, if you miss three or more appointments without notifying the office, you may be dismissed from the clinic at the provider's discretion.    Again, thank you for choosing [facility].  Our hope is that these requests will decrease the amount of time that you wait before being seen by our physicians.   If you have a lab appointment with the [facility] please come in thru the Main Entrance and check in at the main information desk.           _____________________________________________________________  Should you have questions after your visit to [facility], please contact our office at [phone] and follow the prompts.  Our office hours are 8:00 a.m. to 4:30 p.m. Monday - Thursday and 8:00 a.m. to 2:30 p.m. Friday.  Please note that voicemails left after 4:00 p.m. may not be returned until the following business day.  We are closed weekends and all major holidays.  You do have access to a nurse 24-7, just call the main number to the clinic  [phone] and do not press any options, hold on the line and a nurse will answer the phone.    For prescription refill requests, have your pharmacy contact our office and allow 72 hours.    Masks are optional in the cancer centers. If you would like for your care team to wear a mask while they are taking care of you, please let them know. You may have one support person who is at least 79 years old accompany you for your appointments.

## 2022-09-12 ENCOUNTER — Other Ambulatory Visit: Payer: Self-pay

## 2022-09-12 ENCOUNTER — Telehealth: Payer: Self-pay | Admitting: Cardiology

## 2022-09-12 DIAGNOSIS — D649 Anemia, unspecified: Secondary | ICD-10-CM

## 2022-09-12 LAB — KAPPA/LAMBDA LIGHT CHAINS
Kappa free light chain: 41.1 mg/L — ABNORMAL HIGH (ref 3.3–19.4)
Kappa, lambda light chain ratio: 1.93 — ABNORMAL HIGH (ref 0.26–1.65)
Lambda free light chains: 21.3 mg/L (ref 5.7–26.3)

## 2022-09-12 LAB — OCCULT BLOOD X 1 CARD TO LAB, STOOL
Fecal Occult Bld: POSITIVE — AB
Fecal Occult Bld: POSITIVE — AB

## 2022-09-12 NOTE — Telephone Encounter (Signed)
Kathyrn Drown D, NP6 hours ago (10:56 AM)    Per Dr. Quentin Ore regarding Lucas Dudley holding AC/Plavix:   "Yes. I would like him to wait for holding AC/Plavix if not urgent procedure. If they think procedure is urgent, then proceed with brief interruption."   Kathyrn Drown NP-C Structural Heart Team  Pager: [phone] Phone: [phone]      See previous notes

## 2022-09-12 NOTE — Telephone Encounter (Signed)
Per Dr. Quentin Ore regarding Lucas Dudley holding AC/Plavix:  "Yes. I would like him to wait for holding AC/Plavix if not urgent procedure. If they think procedure is urgent, then proceed with brief interruption."  Kathyrn Drown NP-C Structural Heart Team  Pager: [phone] Phone: [phone]

## 2022-09-13 ENCOUNTER — Telehealth: Payer: Self-pay

## 2022-09-13 LAB — PROTEIN ELECTROPHORESIS, SERUM
A/G Ratio: 1.1 (ref 0.7–1.7)
Albumin ELP: 3.5 g/dL (ref 2.9–4.4)
Alpha-1-Globulin: 0.3 g/dL (ref 0.0–0.4)
Alpha-2-Globulin: 0.8 g/dL (ref 0.4–1.0)
Beta Globulin: 1 g/dL (ref 0.7–1.3)
Gamma Globulin: 1 g/dL (ref 0.4–1.8)
Globulin, Total: 3.1 g/dL (ref 2.2–3.9)
Total Protein ELP: 6.6 g/dL (ref 6.0–8.5)

## 2022-09-13 LAB — METHYLMALONIC ACID, SERUM: Methylmalonic Acid, Quantitative: 353 nmol/L (ref 0–378)

## 2022-09-13 LAB — HAPTOGLOBIN: Haptoglobin: 173 mg/dL (ref 34–355)

## 2022-09-13 NOTE — Telephone Encounter (Signed)
DCM and CLC, The cardiology team would like to wait before Plavix and anticoagulation hold for a little while longer.  Based on the way this lesion looked as well as the negative CT scan at the time of finding this in August, I think it is reasonable to wait at least another 2 months so that cardiology can feel better about antiplatelet and anticoagulation hold. But I will defer to you both, as you know this patient and his GI issues, if you feel he needs to have this done still urgently at which point cardiology has given the okay but they have asked for only a brief interruption, which may not allow attempt at sampling (if necessary). Let us know what you think and then we can work on rescheduling or maintaining the procedure based on your guys thoughts. Thanks. GM

## 2022-09-13 NOTE — Telephone Encounter (Signed)
Thanks for the update [place]. I agree is reasonable to wait, this was more of an incidental finding and his anemia was related to the AVMs, which were successfully ablated. Last Hb has been stable/better. Can wait a couple of months and be rescheduled. Thanks for your help!

## 2022-09-13 NOTE — Telephone Encounter (Signed)
     Primary Cardiologist: Rozann Lesches, MD  Chart reviewed as part of pre-operative protocol coverage. Given past medical history and time since last visit, based on ACC/AHA guidelines, CLIFFTON SPRADLEY would be at acceptable risk for the planned procedure without further cardiovascular testing.   Per Dr. Quentin Ore regarding Lynden Oxford holding AC/Plavix:   "Yes. I would like him to wait for holding AC/Plavix if not urgent procedure. If they think procedure is urgent, then proceed with brief interruption."    I will route this recommendation to the requesting party via [facility] fax function and remove from pre-op pool.  Please call with questions.  Jossie Ng. Tesla Keeler NP-C     09/13/2022, 7:24 AM [place] [address] Office [phone] Fax [phone]

## 2022-09-13 NOTE — Telephone Encounter (Signed)
Dr Rush Landmark see the response from cardiology and advise

## 2022-09-13 NOTE — Telephone Encounter (Signed)
Discussed CT results with the patient. He understands he will continue Plavix until the 6 month mark when he sees Sharee Pimple in clinic in March. His states EGD has been cancelled and will be rescheduled once 6 month mark is met. He was grateful for call and agreed with plan.

## 2022-09-13 NOTE — Telephone Encounter (Signed)
DCM, Perfect. GM  CTL and JDM, Would you be OK for anti-PLT and anticoagulation hold to occur in February? Please let us know and we will reschedule procedure. Thanks. GM  Patty, Please go ahead and remove this patient and put him back in the queue. We will hear back from Cardiology for timing of rescheduling. Hold that slot. GM

## 2022-09-13 NOTE — Telephone Encounter (Signed)
-----  Message from Sherren Mocha, MD sent at 09/05/2022  2:45 PM EST ----- Thx. Criteria met for discontinuation of Simpson. Continue clopidogrel through 6 months, then ok to transition to ASA 81 mg daily.  ----- Message ----- From: Theodoro Parma, RN Sent: 09/05/2022   2:39 PM EST To: Sherren Mocha, MD

## 2022-09-13 NOTE — Telephone Encounter (Signed)
The pt has been cancelled and recall entered for Feb. 2024. The pt has been advised

## 2022-09-14 NOTE — Telephone Encounter (Addendum)
CTL, Thanks for the follow-up. We will get him set up for February/March procedure. Thanks. GM

## 2022-09-19 LAB — COPPER, SERUM: Copper: 139 ug/dL — ABNORMAL HIGH (ref 69–132)

## 2022-09-24 NOTE — Progress Notes (Signed)
Lucas Dudley, [place] [id number]   CLINIC:  Medical Oncology/Hematology  PCP:  Lucas Evens, MD [place] [address] [phone]   REASON FOR VISIT:  Follow-up for normocytic anemia  CURRENT THERAPY: Iron tablet twice daily with vitamin C  INTERVAL HISTORY:  Lucas Dudley 81 y.o. male returns for routine follow-up of his normocytic anemia.  He was seen for initial consultation by Dr. Delton Coombes on 09/11/2022.  Patient seen at the request of gastroenterology [facility], NP) for further workup and management of normocytic anemia.  Patient was hospitalized in August 2023 and found to have Hgb 7.6, received 2 units PRBC.  He underwent EGD and colonoscopy which did not show any evidence of bleeding.  Subsequent enteroscopy again did not show a clear source of bleeding.  He has been taking iron tablet twice daily with vitamin C since August 2023.  This causes him constipation, so he is also taking stool softener and MiraLAX.   He reports 1 episode of black tarry stool about a month ago.  Otherwise no rectal bleeding or melena. He was previously on Eliquis until September 2023, when he underwent Watchman procedure, now on Plavix.  His energy is significantly improved, but he still has some residual fatigue and is not yet back to normal.  He reports that he "loves to eat ice," but states that this has been ongoing for many years.  He has occasional chest pain as well as orthopnea, and follows with cardiology for congestive heart failure.  He denies any headaches, lightheadedness, or syncope.  He has 50% energy and 100% appetite. He endorses that he is maintaining a stable weight.   REVIEW OF SYSTEMS:  Review of Systems  Constitutional:  Positive for fatigue. Negative for appetite change, chills, diaphoresis, fever and unexpected weight change.  HENT:   Negative for lump/mass and nosebleeds.   Eyes:  Negative for eye problems.   Respiratory:  Positive for cough (bronchitis). Negative for hemoptysis and shortness of breath.   Cardiovascular:  Positive for chest pain. Negative for leg swelling and palpitations.  Gastrointestinal:  Positive for constipation. Negative for abdominal pain, blood in stool, diarrhea, nausea and vomiting.  Genitourinary:  Negative for hematuria.   Skin: Negative.   Neurological:  Positive for numbness. Negative for dizziness, headaches and light-headedness.  Hematological:  Does not bruise/bleed easily.      PAST MEDICAL/SURGICAL HISTORY:  Past Medical History:  Diagnosis Date   Arthritis    Atrial fibrillation (HCC)    CAD (coronary artery disease)    a. Cath 03/17/15 showing 100% ostial D1, 50% prox LAD to mid LAD, 40% RPDA stenosis. Med rx. // Myoview 01/2020: EF 31 diffuse perfusion defect without reversibility (suspect artifact); reviewed with Dr. Aggie Cosier study felt to be low risk   Cataract    Mixed form OD   Chronic diastolic CHF (congestive heart failure) (HCC)    Diabetic retinopathy ([place])    NPDR OU   Essential hypertension    Glaucoma    POAG OU   History of gout    Hyperlipidemia    Hypertensive retinopathy    OU   Kidney stones    Melanoma of neck (HCC)    NICM (nonischemic cardiomyopathy) ([place])    OSA on CPAP 2012   Prostate cancer ([employer])    Type II diabetes mellitus ([place])    Past Surgical History:  Procedure Laterality Date   ABDOMINAL HERNIA REPAIR  w/mesh   ATRIAL FIBRILLATION ABLATION N/A 06/06/2021   Procedure: ATRIAL FIBRILLATION ABLATION;  Surgeon: Thompson Grayer, MD;  Location: [place] [facility];  Service: Cardiovascular;  Laterality: N/A;   BIOPSY  05/24/2022   Procedure: BIOPSY;  Surgeon: Daneil Dolin, MD;  [facility];  Service: Endoscopy;;   BIOPSY  05/30/2022   Procedure: BIOPSY;  Surgeon: Daneil Dolin, MD;  [facility];  Service: Endoscopy;;   CARDIAC CATHETERIZATION N/A 03/17/2015   Procedure: Left Heart Cath  and Coronary Angiography;  Surgeon: Lorretta Harp, MD;  Location: [place] [facility];  Service: Cardiovascular;  Laterality: N/A;   CARDIOVERSION N/A 08/09/2021   Procedure: CARDIOVERSION;  Surgeon: [name], MD;  Location: [place];  Service: Cardiovascular;  Laterality: N/A;   CARDIOVERSION N/A 11/10/2021   Procedure: CARDIOVERSION;  Surgeon: Berniece Salines, DO;  Location: [place];  Service: Cardiovascular;  Laterality: N/A;   carotid doppler  09/17/2008   rigt and left ICAs 0-49%;mildly  abnormal   CATARACT EXTRACTION Left 2020   Dr. Kathlen Mody   COLONOSCOPY N/A 05/16/2017   Procedure: COLONOSCOPY;  Surgeon: Rogene [place], MD;  [facility];  Service: Endoscopy;  Laterality: N/A;  930   COLONOSCOPY WITH PROPOFOL N/A 05/24/2022   Procedure: COLONOSCOPY WITH PROPOFOL;  Surgeon: Daneil Dolin, MD;  [facility];  Service: Endoscopy;  Laterality: N/A;   DOPPLER ECHOCARDIOGRAPHY  05/25/2009   EF 50-55%,LA mildly dilated, LV function normal   ELECTROPHYSIOLOGIC STUDY N/A 04/05/2015   Procedure: Cardioversion;  Surgeon: Sanda Klein, MD;  Location: [place] [facility];  Service: Cardiovascular;  Laterality: N/A;   ELECTROPHYSIOLOGIC STUDY N/A 09/06/2015   Procedure: Atrial Fibrillation Ablation;  Surgeon: Thompson Grayer, MD;  Location: [place] [facility];  Service: Cardiovascular;  Laterality: N/A;   ELECTROPHYSIOLOGIC STUDY N/A 07/12/2016   redo afib ablation by Dr Rayann Heman   ENTEROSCOPY N/A 05/30/2022   Procedure: ENTEROSCOPY;  Surgeon: Daneil Dolin, MD;  [facility];  Service: Endoscopy;  Laterality: N/A;   ESOPHAGOGASTRODUODENOSCOPY (EGD) WITH PROPOFOL N/A 05/24/2022   Procedure: ESOPHAGOGASTRODUODENOSCOPY (EGD) WITH PROPOFOL;  Surgeon: Daneil Dolin, MD;  [facility];  Service: Endoscopy;  Laterality: N/A;   EXCISIONAL HEMORRHOIDECTOMY     "inside and out"   EYE SURGERY Left 2020   Cat Sx - Dr. Kathlen Mody   FINE NEEDLE ASPIRATION  Right    knee; "drew ~ 1 quart off"   GIVENS CAPSULE STUDY N/A 05/25/2022   Procedure: GIVENS CAPSULE STUDY;  Surgeon: Eloise Harman, DO;  [facility];  Service: Endoscopy;  Laterality: N/A;   HERNIA REPAIR     LAPAROSCOPIC CHOLECYSTECTOMY     LEFT ATRIAL APPENDAGE OCCLUSION N/A 06/28/2022   Procedure: LEFT ATRIAL APPENDAGE OCCLUSION;  Surgeon: Sherren Mocha, MD;  Location: [place] [facility];  Service: Cardiovascular;  Laterality: N/A;   MELANOMA EXCISION Right    "neck"   NM MYOCAR PERF WALL MOTION  02/21/2012   EF 61% ,EXERCISE 7 METS. exercise stopped due to wheezing and shortness of breathe   POLYPECTOMY  05/16/2017   Procedure: POLYPECTOMY;  Surgeon: Rogene [place], MD;  [facility];  Service: Endoscopy;;  colon   POLYPECTOMY  05/24/2022   Procedure: POLYPECTOMY;  Surgeon: Daneil Dolin, MD;  [facility];  Service: Endoscopy;;   PROSTATECTOMY     SHOULDER OPEN ROTATOR CUFF REPAIR Right X 2   SUBMUCOSAL TATTOO  INJECTION  05/30/2022   Procedure: SUBMUCOSAL TATTOO INJECTION;  Surgeon: Daneil Dolin, MD;  [facility];  Service: Endoscopy;;   TEE WITHOUT CARDIOVERSION N/A 09/05/2015   Procedure: TRANSESOPHAGEAL ECHOCARDIOGRAM (TEE);  Surgeon: Sueanne Margarita, MD;  Location: [facility];  Service: Cardiovascular;  Laterality: N/A;   TEE WITHOUT CARDIOVERSION N/A 06/28/2022   Procedure: TRANSESOPHAGEAL ECHOCARDIOGRAM (TEE);  Surgeon: Sherren Mocha, MD;  Location: [place] [facility];  Service: Cardiovascular;  Laterality: N/A;     SOCIAL HISTORY:  Social History   Socioeconomic History   Marital status: Widowed    Spouse name: Not on file   Number of children: 1   Years of education: Not on file   Highest education level: Not on file  Occupational History   Occupation: Retired  Tobacco Use   Smoking status: Former    Packs/day: 2.00    Years: 27.00    Total pack years: 54.00    Types: Cigarettes    Quit date: 1992     Years since quitting: 31.9    Passive exposure: Current   Smokeless tobacco: Never   Tobacco comments:    Former smoker 09/27/21  Vaping Use   Vaping Use: Never used  Substance and Sexual Activity   Alcohol use: No    Alcohol/week: 0.0 standard drinks of alcohol    Comment: "used to drink; stopped ~ 2008"   Drug use: No   Sexual activity: Not Currently  Other Topics Concern   Not on file  Social History Narrative   Lives in [place], [place] with wife.   Social Determinants of Health   Financial Resource Strain: Not on file  Food Insecurity: No Food Insecurity (09/11/2022)   Hunger Vital Sign    Worried About Running Out of Food in the Last Year: Never true    Ran Out of Food in the Last Year: Never true  Transportation Needs: No Transportation Needs (09/11/2022)   PRAPARE - Hydrologist (Medical): No    Lack of Transportation (Non-Medical): No  Physical Activity: Not on file  Stress: Not on file  Social Connections: Not on file  Intimate Partner Violence: Not At Risk (09/11/2022)   Humiliation, Afraid, Rape, and Kick questionnaire    Fear of Current or Ex-Partner: No    Emotionally Abused: No    Physically Abused: No    Sexually Abused: No    FAMILY HISTORY:  Family History  Problem Relation Age of Onset   Heart disease Mother    Lung cancer Mother    Heart attack Mother 53   Diabetes Father    Heart disease Father    Stroke Brother    Healthy Daughter    Colon cancer Maternal Aunt        70s    CURRENT MEDICATIONS:  Outpatient Encounter Medications as of 09/25/2022  Medication Sig Note   albuterol (VENTOLIN HFA) 108 (90 Base) MCG/ACT inhaler Inhale 2 puffs into the lungs every 4 (four) hours as needed for wheezing or shortness of breath.    amiodarone (PACERONE) 200 MG tablet TAKE 1 TABLET BY MOUTH EVERY DAY    amLODipine (NORVASC) 10 MG tablet Take 10 mg by mouth at bedtime.    camphor-menthol (ANTI-ITCH) lotion Apply 1 Application  topically daily as needed for itching.    carvedilol (COREG) 3.125 MG tablet Take 3.125 mg by mouth 2 (two) times daily with a meal.    clopidogrel (PLAVIX) 75 MG tablet Take  1 tablet (75 mg total) by mouth daily. START 10/29 AFTER TAKING LAST DOSE OF ELIQUIS 2.5 MG ON 10/28    COMBIGAN 0.2-0.5 % ophthalmic solution Apply 1 drop to eye 2 (two) times daily.    CRANBERRY PO Take 1 tablet by mouth daily.    diphenhydramine-acetaminophen (TYLENOL PM) 25-500 MG TABS tablet Take 2 tablets by mouth at bedtime.    docusate sodium (COLACE) 100 MG capsule Take 1 capsule (100 mg total) by mouth daily. (Patient taking differently: Take 100 mg by mouth 2 (two) times daily.) 08/27/2022: 2 daily   ferrous sulfate 325 (65 FE) MG tablet Take 1 tablet (325 mg total) by mouth 2 (two) times daily with a meal.    gemfibrozil (LOPID) 600 MG tablet Take 600 mg by mouth at bedtime.     glimepiride (AMARYL) 2 MG tablet Take 2 mg by mouth at bedtime. 08/27/2022: Takes in the afternoon   Insulin Glargine (BASAGLAR KWIKPEN) 100 UNIT/ML Inject 32 Units into the skin daily.    KLOR-CON M20 20 MEQ tablet TAKE 1 TABLET BY MOUTH EVERY DAY    lisinopril (ZESTRIL) 20 MG tablet Take 1 tablet (20 mg total) by mouth daily. 08/27/2022: One bid   Melatonin 10 MG TABS Take 10 mg by mouth at bedtime.    metFORMIN (GLUCOPHAGE-XR) 500 MG 24 hr tablet Take 1,000 mg by mouth in the morning and at bedtime.    mineral oil liquid Take 15 mLs by mouth daily as needed for moderate constipation.    Multiple Vitamins-Minerals (MULTIVITAMIN WITH MINERALS) tablet Take 1 tablet by mouth daily. Centrum men 50+    Omega-3 1000 MG CAPS Take 1,000 mg by mouth daily.    pantoprazole (PROTONIX) 40 MG tablet Take 1 tablet (40 mg total) by mouth daily.    torsemide (DEMADEX) 20 MG tablet Take 1 tablet (20 mg total) by mouth 2 (two) times daily.    valACYclovir (VALTREX) 1000 MG tablet Take 1,000 mg by mouth at bedtime.    Facility-Administered Encounter  Medications as of 09/25/2022  Medication   0.9 %  sodium chloride infusion   0.9 %  sodium chloride infusion    ALLERGIES:  Allergies  Allergen Reactions   Azithromycin Nausea Only   Tape Rash and Other (See Comments)    Causes skin redness, Use paper tape only.     PHYSICAL EXAM:  ECOG PERFORMANCE STATUS: 1 - Symptomatic but completely ambulatory  There were no vitals filed for this visit. There were no vitals filed for this visit. Physical Exam Constitutional:      Appearance: Normal appearance. He is obese.  HENT:     Head: Normocephalic and atraumatic.     Mouth/Throat:     Mouth: Mucous membranes are moist.  Eyes:     Extraocular Movements: Extraocular movements intact.     Pupils: Pupils are equal, round, and reactive to light.  Cardiovascular:     Rate and Rhythm: Normal rate and regular rhythm.     Pulses: Normal pulses.     Heart sounds: Normal heart sounds.  Pulmonary:     Effort: Pulmonary effort is normal.     Breath sounds: Normal breath sounds.  Abdominal:     General: Bowel sounds are normal.     Palpations: Abdomen is soft.     Tenderness: There is no abdominal tenderness.  Musculoskeletal:        General: No swelling.     Right lower leg: No edema.  Left lower leg: No edema.  Lymphadenopathy:     Cervical: No cervical adenopathy.  Skin:    General: Skin is warm and dry.  Neurological:     General: No focal deficit present.     Mental Status: He is alert and oriented to person, place, and time.  Psychiatric:        Mood and Affect: Mood normal.        Behavior: Behavior normal.      LABORATORY DATA:  I have reviewed the labs as listed.  CBC    Component Value Date/Time   WBC 6.8 09/11/2022 1427   RBC 3.69 (L) 09/11/2022 1427   RBC 3.75 (L) 09/11/2022 1427   HGB 11.9 (L) 09/11/2022 1427   HGB 10.5 (L) 08/01/2022 1123   HCT 36.5 (L) 09/11/2022 1427   HCT 32.6 (L) 08/01/2022 1123   PLT 254 09/11/2022 1427   PLT 313 08/01/2022  1123   MCV 98.9 09/11/2022 1427   MCV 93 08/01/2022 1123   MCH 32.2 09/11/2022 1427   MCHC 32.6 09/11/2022 1427   RDW 14.0 09/11/2022 1427   RDW 19.9 (H) 08/01/2022 1123   LYMPHSABS 0.9 09/11/2022 1427   LYMPHSABS 1.2 05/16/2021 1130   MONOABS 0.5 09/11/2022 1427   EOSABS 0.1 09/11/2022 1427   EOSABS 0.1 05/16/2021 1130   BASOSABS 0.0 09/11/2022 1427   BASOSABS 0.0 05/16/2021 1130      Latest Ref Rng & Units 08/01/2022   11:23 AM 06/28/2022    8:26 AM 06/13/2022    3:14 PM  CMP  Glucose 70 - 99 mg/dL 218  198  134   BUN 8 - 27 mg/dL '22  23  15   '$ Creatinine 0.76 - 1.27 mg/dL 1.32  1.15  1.10   Sodium 134 - 144 mmol/L 142  140  140   Potassium 3.5 - 5.2 mmol/L 5.2  3.8  4.9   Chloride 96 - 106 mmol/L 98  100  98   CO2 20 - 29 mmol/L 32  26  24   Calcium 8.6 - 10.2 mg/dL 9.4  9.3  9.5   Total Protein 6.0 - 8.5 g/dL 6.9     Total Bilirubin 0.0 - 1.2 mg/dL <0.2     Alkaline Phos 44 - 121 IU/L 75     AST 0 - 40 IU/L 15     ALT 0 - 44 IU/L 11       DIAGNOSTIC IMAGING:  I have independently reviewed the relevant imaging and discussed with the patient.  ASSESSMENT & PLAN: 1.  Normocytic anemia: - Patient seen at the request of Scherrie Gerlach, NP -Hospitalized in August 2023 and found to have Hgb 7.6, received 2 units PRBC.  He underwent EGD and colonoscopy which did not show any evidence of bleeding.  Subsequent enteroscopy again did not show a clear source of bleeding. 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  Small bowel nodule - junction distal duodenum/jejunum.  Irregular with a submucosal component, suspicious in appearance.  Pathology was benign. - Colonoscopy (05/24/2022): 5 mm polyp in the sigmoid colon, diverticulosis in the descending colon and at the splenic flexure.  Pathology - hyperplastic polyp. - EGD (05/24/2022): Normal esophagus, small hiatal hernia, friable gastric mucosa, couple of tiny gastric erosions, normal  duodenal bulb and second part of duodenum. - He was on Eliquis for about 4 years until September 2023 when he had Watchman procedure done.  Since then he is on  Plavix daily. - He reports 1 episode of tarry stool in October 2023. - He is taking iron tablet twice daily with vitamin C since his discharge from the hospital in August 2023.  (Taking stool softeners due to iron related constipation.) - Hematology workup (09/03/2022): Hemoccult stool positive x 2 Normal reticulocytes, haptoglobin, DAT/Coombs. Persistent but improved iron deficiency with ferritin 33, iron saturation 17% Vitamin B12 deficiency with low B12 162, normal MMA.  Normal copper. SPEP negative.  Minimal elevations in free light chains with kappa 41.1, normal lambda 21.3, and mildly elevated ratio 1.93. Labs from 08/01/2022 show evidence of mild CKD stage IIIa (creatinine 1.32/GFR 55) - Most recent CBC (09/11/2022): Hgb 11.9/MCV 98.9. - DIFFERENTIAL DIAGNOSIS: Suspect iron deficiency anemia in the setting of occult GI bleeding as well as vitamin B12 deficiency.  May also have some small aspect of anemia related to his CKD. - PLAN: Hemoglobin has improved and iron stores are slowly trending upward.  Continue oral iron supplementation. - Vitamin B12 injection to be given in clinic today.  Recommend the patient start vitamin B12 1000 mcg daily.  Will recheck B12/MMA in 3 months. - We will check serum immunofixation with next lab draw due to mild elevation in free light chains. - Labs and RTC in 3 months.  2.  Social/family history: - Lives at home by himself.  He is seen today with his daughter [name].  He is independent of ADLs and IADLs.  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  He has exposure to torrent chemicals.  He smoked 1 pack/day for 35 years and quit smoking in 1992. - No family history of significant anemia. - Mother had mesothelioma.  Maternal aunt had colon cancer.  Maternal uncle had adrenal  gland cancer.   PLAN SUMMARY: >> Vitamin B12 injection x 1, given TODAY >> Labs in 3 months (CBC/D, ferritin, iron/TIBC, B12, MMA, immunofixation) >> OFFICE visit 1 week after labs   All questions were answered. The patient knows to call the clinic with any problems, questions or concerns.  Medical decision making: Moderate  Time spent on visit: I spent 20 minutes counseling the patient face to face. The total time spent in the appointment was 30 minutes and more than 50% was on counseling.   Harriett Rush, PA-C  09/25/2022 9:58 PM

## 2022-09-25 ENCOUNTER — Inpatient Hospital Stay: Payer: Medicare Other | Attending: Hematology | Admitting: Physician Assistant

## 2022-09-25 ENCOUNTER — Inpatient Hospital Stay: Payer: Medicare Other

## 2022-09-25 VITALS — BP 136/57 | HR 44 | Temp 97.4°F | Resp 16 | Wt 236.0 lb

## 2022-09-25 DIAGNOSIS — I110 Hypertensive heart disease with heart failure: Secondary | ICD-10-CM | POA: Diagnosis not present

## 2022-09-25 DIAGNOSIS — Z8249 Family history of ischemic heart disease and other diseases of the circulatory system: Secondary | ICD-10-CM | POA: Diagnosis not present

## 2022-09-25 DIAGNOSIS — Z8546 Personal history of malignant neoplasm of prostate: Secondary | ICD-10-CM | POA: Insufficient documentation

## 2022-09-25 DIAGNOSIS — I4891 Unspecified atrial fibrillation: Secondary | ICD-10-CM | POA: Insufficient documentation

## 2022-09-25 DIAGNOSIS — Z888 Allergy status to other drugs, medicaments and biological substances status: Secondary | ICD-10-CM | POA: Insufficient documentation

## 2022-09-25 DIAGNOSIS — Z881 Allergy status to other antibiotic agents status: Secondary | ICD-10-CM | POA: Diagnosis not present

## 2022-09-25 DIAGNOSIS — R5383 Other fatigue: Secondary | ICD-10-CM | POA: Diagnosis not present

## 2022-09-25 DIAGNOSIS — K5900 Constipation, unspecified: Secondary | ICD-10-CM | POA: Diagnosis not present

## 2022-09-25 DIAGNOSIS — D500 Iron deficiency anemia secondary to blood loss (chronic): Secondary | ICD-10-CM

## 2022-09-25 DIAGNOSIS — Z9049 Acquired absence of other specified parts of digestive tract: Secondary | ICD-10-CM | POA: Insufficient documentation

## 2022-09-25 DIAGNOSIS — Z7902 Long term (current) use of antithrombotics/antiplatelets: Secondary | ICD-10-CM | POA: Diagnosis not present

## 2022-09-25 DIAGNOSIS — Z87891 Personal history of nicotine dependence: Secondary | ICD-10-CM | POA: Diagnosis not present

## 2022-09-25 DIAGNOSIS — Z87442 Personal history of urinary calculi: Secondary | ICD-10-CM | POA: Diagnosis not present

## 2022-09-25 DIAGNOSIS — E538 Deficiency of other specified B group vitamins: Secondary | ICD-10-CM | POA: Diagnosis not present

## 2022-09-25 DIAGNOSIS — I5032 Chronic diastolic (congestive) heart failure: Secondary | ICD-10-CM | POA: Insufficient documentation

## 2022-09-25 DIAGNOSIS — Z823 Family history of stroke: Secondary | ICD-10-CM | POA: Diagnosis not present

## 2022-09-25 DIAGNOSIS — R079 Chest pain, unspecified: Secondary | ICD-10-CM | POA: Insufficient documentation

## 2022-09-25 DIAGNOSIS — R768 Other specified abnormal immunological findings in serum: Secondary | ICD-10-CM | POA: Diagnosis not present

## 2022-09-25 DIAGNOSIS — I2510 Atherosclerotic heart disease of native coronary artery without angina pectoris: Secondary | ICD-10-CM | POA: Diagnosis not present

## 2022-09-25 DIAGNOSIS — Z7901 Long term (current) use of anticoagulants: Secondary | ICD-10-CM | POA: Insufficient documentation

## 2022-09-25 DIAGNOSIS — Z801 Family history of malignant neoplasm of trachea, bronchus and lung: Secondary | ICD-10-CM | POA: Diagnosis not present

## 2022-09-25 DIAGNOSIS — Z833 Family history of diabetes mellitus: Secondary | ICD-10-CM | POA: Diagnosis not present

## 2022-09-25 DIAGNOSIS — D649 Anemia, unspecified: Secondary | ICD-10-CM | POA: Diagnosis present

## 2022-09-25 DIAGNOSIS — Z79899 Other long term (current) drug therapy: Secondary | ICD-10-CM | POA: Insufficient documentation

## 2022-09-25 DIAGNOSIS — Z9079 Acquired absence of other genital organ(s): Secondary | ICD-10-CM | POA: Diagnosis not present

## 2022-09-25 DIAGNOSIS — R200 Anesthesia of skin: Secondary | ICD-10-CM | POA: Insufficient documentation

## 2022-09-25 DIAGNOSIS — R059 Cough, unspecified: Secondary | ICD-10-CM | POA: Insufficient documentation

## 2022-09-25 DIAGNOSIS — Z800 Family history of malignant neoplasm of digestive organs: Secondary | ICD-10-CM | POA: Insufficient documentation

## 2022-09-25 MED ORDER — VITAMIN B-12 1000 MCG PO TABS: 1000.0000 ug | ORAL_TABLET | Freq: Every day | ORAL | Status: DC

## 2022-09-25 MED ORDER — FERROUS SULFATE 325 (65 FE) MG PO TABS: 325.0000 mg | ORAL_TABLET | Freq: Every day | ORAL | 1 refills | Status: AC

## 2022-09-25 MED ORDER — CYANOCOBALAMIN 1000 MCG/ML IJ SOLN
1000.0000 ug | Freq: Once | INTRAMUSCULAR | Status: AC
  Administered 2022-09-25: 1000 ug via INTRAMUSCULAR

## 2022-09-25 NOTE — Progress Notes (Signed)
B 12 injection given in left deltoid per order.  Patient tolerated without incident.  Discharged home ambulatory in stable condition with daughter.

## 2022-09-25 NOTE — Patient Instructions (Signed)

## 2022-09-25 NOTE — Patient Instructions (Signed)
[facility] **   You were seen today by Tarri Abernethy PA-C for your anemia.    IRON DEFICIENCY ANEMIA This is most likely caused by difficult to find blood loss in your stomach and intestines.  Continue to follow-up with gastroenterology and seek immediate medical attention if you notice any signs of major blood loss (bright red blood in the toilet or black/tarry bowel movements). Continue to take iron tablet with vitamin C.  Take this ONCE daily in the morning.  Take your acid reflux medication later in the day, and not at the same time as your iron medication.  B12 DEFICIENCY We gave you a vitamin B12 injection during your visit today. I would like you to start taking vitamin B12 over-the-counter supplement 1000 mcg daily.  LABS: Return in 3 months for repeat labs   FOLLOW-UP APPOINTMENT: Office visit in 3 months, 1 week after labs  ** Thank you for trusting me with your healthcare!  I strive to provide all of my patients with quality care at each visit.  If you receive a survey for this visit, I would be so grateful to you for taking the time to provide feedback.  Thank you in advance!  ~ Mckinze Poirier                   Dr. Derek Jack   &   Tarri Abernethy, PA-C   - - - - - - - - - - - - - - - - - -    Thank you for choosing [place] [facility] to provide your oncology and hematology care.  To afford each patient quality time with our provider, please arrive at least 15 minutes before your scheduled appointment time.   If you have a lab appointment with the [facility] please come in thru the Main Entrance and check in at the main information desk.  You need to re-schedule your appointment should you arrive 10 or more minutes late.  We strive to give you quality time with our providers, and arriving late affects you and other patients whose appointments are after  yours.  Also, if you no show three or more times for appointments you may be dismissed from the clinic at the providers discretion.     Again, thank you for choosing [facility].  Our hope is that these requests will decrease the amount of time that you wait before being seen by our physicians.       _____________________________________________________________  Should you have questions after your visit to [facility], please contact our office at [phone] and follow the prompts.  Our office hours are 8:00 a.m. and 4:30 p.m. Monday - Friday.  Please note that voicemails left after 4:00 p.m. may not be returned until the following business day.  We are closed weekends and major holidays.  You do have access to a nurse 24-7, just call the main number to the clinic [phone] and do not press any options, hold on the line and a nurse will answer the phone.    For prescription refill requests, have your pharmacy contact our office and allow 72 hours.

## 2022-09-27 ENCOUNTER — Other Ambulatory Visit: Payer: Self-pay

## 2022-09-27 DIAGNOSIS — E538 Deficiency of other specified B group vitamins: Secondary | ICD-10-CM

## 2022-09-27 DIAGNOSIS — D500 Iron deficiency anemia secondary to blood loss (chronic): Secondary | ICD-10-CM

## 2022-09-27 DIAGNOSIS — R768 Other specified abnormal immunological findings in serum: Secondary | ICD-10-CM

## 2022-09-27 DIAGNOSIS — D649 Anemia, unspecified: Secondary | ICD-10-CM

## 2022-10-01 ENCOUNTER — Ambulatory Visit (HOSPITAL_COMMUNITY): Admit: 2022-10-01 | Payer: Medicare Other | Admitting: Gastroenterology

## 2022-10-01 ENCOUNTER — Encounter (HOSPITAL_COMMUNITY): Payer: Self-pay

## 2022-10-01 SURGERY — ESOPHAGOGASTRODUODENOSCOPY (EGD) WITH PROPOFOL
Anesthesia: Monitor Anesthesia Care

## 2022-10-06 ENCOUNTER — Other Ambulatory Visit: Payer: Self-pay | Admitting: Internal Medicine

## 2022-10-11 NOTE — Progress Notes (Addendum)
[facility] Note  10/12/2022    CHIEF COMPLAINT Patient presents for Retina Follow Up  HISTORY OF PRESENT ILLNESS: Lucas Dudley is a 81 y.o. male who presents to the clinic today for:  HPI     Retina Follow Up   Patient presents with  Diabetic Retinopathy.  In both eyes.  This started 4.5 weeks ago.  I, the attending physician,  performed the HPI with the patient and updated documentation appropriately.        Comments   Patient here for 4.5 weeks retina follow up for NPDR OU. Patient states vision about the same. No eye pain. Still using combigan BID OU. Used this am.       Last edited by Bernarda Caffey, MD on 10/12/2022  3:22 PM.     Referring physician: [name], MD [place],  [place] [address]  HISTORICAL INFORMATION:   Selected notes from the MEDICAL RECORD NUMBER Referred by Dr. Kathlen Mody for DEE   CURRENT MEDICATIONS: Current Outpatient Medications (Ophthalmic Drugs)  Medication Sig   COMBIGAN 0.2-0.5 % ophthalmic solution Apply 1 drop to eye 2 (two) times daily.   No current facility-administered medications for this visit. (Ophthalmic Drugs)   Current Outpatient Medications (Other)  Medication Sig   albuterol (VENTOLIN HFA) 108 (90 Base) MCG/ACT inhaler Inhale 2 puffs into the lungs every 4 (four) hours as needed for wheezing or shortness of breath.   amiodarone (PACERONE) 200 MG tablet TAKE 1 TABLET BY MOUTH EVERY DAY   amLODipine (NORVASC) 10 MG tablet TAKE 1 TABLET BY MOUTH EVERYDAY AT BEDTIME   Ascorbic Acid (VITAMIN C PO) Take by mouth.   camphor-menthol (ANTI-ITCH) lotion Apply 1 Application topically daily as needed for itching.   carvedilol (COREG) 3.125 MG tablet Take 3.125 mg by mouth 2 (two) times daily with a meal.   cefdinir (OMNICEF) 300 MG capsule Take 300 mg by mouth 2 (two) times daily.   clopidogrel (PLAVIX) 75 MG tablet Take 1 tablet (75 mg total) by mouth daily. START 10/29 AFTER TAKING LAST  DOSE OF ELIQUIS 2.5 MG ON 10/28   CRANBERRY PO Take 1 tablet by mouth daily.   cyanocobalamin (VITAMIN B12) 1000 MCG tablet Take 1 tablet (1,000 mcg total) by mouth daily.   diphenhydramine-acetaminophen (TYLENOL PM) 25-500 MG TABS tablet Take 2 tablets by mouth at bedtime.   docusate sodium (COLACE) 100 MG capsule Take 1 capsule (100 mg total) by mouth daily. (Patient taking differently: Take 100 mg by mouth 2 (two) times daily.)   ELDERBERRY PO Take by mouth.   ferrous sulfate 325 (65 FE) MG tablet Take 1 tablet (325 mg total) by mouth daily with breakfast.   gemfibrozil (LOPID) 600 MG tablet Take 600 mg by mouth at bedtime.    glimepiride (AMARYL) 2 MG tablet Take 2 mg by mouth at bedtime.   Insulin Glargine (BASAGLAR KWIKPEN) 100 UNIT/ML Inject 32 Units into the skin daily.   KLOR-CON M20 20 MEQ tablet TAKE 1 TABLET BY MOUTH EVERY DAY   lisinopril (ZESTRIL) 20 MG tablet Take 1 tablet (20 mg total) by mouth daily.   Melatonin 10 MG TABS Take 10 mg by mouth at bedtime.   metFORMIN (GLUCOPHAGE-XR) 500 MG 24 hr tablet Take 1,000 mg by mouth in the morning and at bedtime.   mineral oil liquid Take 15 mLs by mouth daily as needed for moderate constipation.   Multiple Vitamins-Minerals (MULTIVITAMIN WITH MINERALS) tablet Take 1  tablet by mouth daily. Centrum men 50+   Multiple Vitamins-Minerals (ZINC PO) Take by mouth.   Omega-3 1000 MG CAPS Take 1,000 mg by mouth daily.   pantoprazole (PROTONIX) 40 MG tablet Take 1 tablet (40 mg total) by mouth daily.   torsemide (DEMADEX) 20 MG tablet Take 1 tablet (20 mg total) by mouth 2 (two) times daily.   valACYclovir (VALTREX) 1000 MG tablet Take 1,000 mg by mouth at bedtime.   Current Facility-Administered Medications (Other)  Medication Route   0.9 %  sodium chloride infusion Intravenous   0.9 %  sodium chloride infusion Intravenous   REVIEW OF SYSTEMS: ROS   Positive for: Gastrointestinal, Endocrine, Cardiovascular, Eyes, Respiratory Negative  for: Constitutional, Neurological, Skin, Genitourinary, Musculoskeletal, HENT, Psychiatric, Allergic/Imm, Heme/Lymph Last edited by Theodore Demark, COA on 10/12/2022  2:38 PM.     ALLERGIES Allergies  Allergen Reactions   Azithromycin Nausea Only   Tape Rash and Other (See Comments)    Causes skin redness, Use paper tape only.   PAST MEDICAL HISTORY Past Medical History:  Diagnosis Date   Arthritis    Atrial fibrillation (HCC)    CAD (coronary artery disease)    a. Cath 03/17/15 showing 100% ostial D1, 50% prox LAD to mid LAD, 40% RPDA stenosis. Med rx. // Myoview 01/2020: EF 31 diffuse perfusion defect without reversibility (suspect artifact); reviewed with Dr. Aggie Cosier study felt to be low risk   Cataract    Mixed form OD   Chronic diastolic CHF (congestive heart failure) (HCC)    Diabetic retinopathy ([place])    NPDR OU   Essential hypertension    Glaucoma    POAG OU   History of gout    Hyperlipidemia    Hypertensive retinopathy    OU   Kidney stones    Melanoma of neck (HCC)    NICM (nonischemic cardiomyopathy) ([place])    OSA on CPAP 2012   Prostate cancer ([place])    Type II diabetes mellitus ([place])    Past Surgical History:  Procedure Laterality Date   ABDOMINAL HERNIA REPAIR     w/mesh   ATRIAL FIBRILLATION ABLATION N/A 06/06/2021   Procedure: ATRIAL FIBRILLATION ABLATION;  Surgeon: Thompson Grayer, MD;  Location: [facility];  Service: Cardiovascular;  Laterality: N/A;   BIOPSY  05/24/2022   Procedure: BIOPSY;  Surgeon: Daneil Dolin, MD;  [facility];  Service: Endoscopy;;   BIOPSY  05/30/2022   Procedure: BIOPSY;  Surgeon: Daneil Dolin, MD;  [facility];  Service: Endoscopy;;   CARDIAC CATHETERIZATION N/A 03/17/2015   Procedure: Left Heart Cath and Coronary Angiography;  Surgeon: Lorretta Harp, MD;  Location: [facility];  Service: Cardiovascular;  Laterality: N/A;   CARDIOVERSION N/A 08/09/2021   Procedure:  CARDIOVERSION;  Surgeon: [name], MD;  Location: [facility];  Service: Cardiovascular;  Laterality: N/A;   CARDIOVERSION N/A 11/10/2021   Procedure: CARDIOVERSION;  Surgeon: Berniece Salines, DO;  Location: [facility];  Service: Cardiovascular;  Laterality: N/A;   carotid doppler  09/17/2008   rigt and left ICAs 0-49%;mildly  abnormal   CATARACT EXTRACTION Left 2020   Dr. Kathlen Mody   COLONOSCOPY N/A 05/16/2017   Procedure: COLONOSCOPY;  Surgeon: Rogene Houston, MD;  [facility];  Service: Endoscopy;  Laterality: N/A;  930   COLONOSCOPY WITH PROPOFOL N/A 05/24/2022   Procedure: COLONOSCOPY WITH PROPOFOL;  Surgeon: Daneil Dolin, MD;  [facility];  Service:  Endoscopy;  Laterality: N/A;   DOPPLER ECHOCARDIOGRAPHY  05/25/2009   EF 50-55%,LA mildly dilated, LV function normal   ELECTROPHYSIOLOGIC STUDY N/A 04/05/2015   Procedure: Cardioversion;  Surgeon: Sanda Klein, MD;  Location: [facility];  Service: Cardiovascular;  Laterality: N/A;   ELECTROPHYSIOLOGIC STUDY N/A 09/06/2015   Procedure: Atrial Fibrillation Ablation;  Surgeon: Thompson Grayer, MD;  Location: [facility];  Service: Cardiovascular;  Laterality: N/A;   ELECTROPHYSIOLOGIC STUDY N/A 07/12/2016   redo afib ablation by Dr Rayann Heman   ENTEROSCOPY N/A 05/30/2022   Procedure: ENTEROSCOPY;  Surgeon: Daneil Dolin, MD;  [facility];  Service: Endoscopy;  Laterality: N/A;   ESOPHAGOGASTRODUODENOSCOPY (EGD) WITH PROPOFOL N/A 05/24/2022   Procedure: ESOPHAGOGASTRODUODENOSCOPY (EGD) WITH PROPOFOL;  Surgeon: Daneil Dolin, MD;  [facility];  Service: Endoscopy;  Laterality: N/A;   EXCISIONAL HEMORRHOIDECTOMY     "inside and out"   EYE SURGERY Left 2020   Cat Sx - Dr. Kathlen Mody   FINE NEEDLE ASPIRATION Right    knee; "drew ~ 1 quart off"   GIVENS CAPSULE STUDY N/A 05/25/2022   Procedure: GIVENS CAPSULE STUDY;  Surgeon: Eloise Harman, DO;  [facility];  Service:  Endoscopy;  Laterality: N/A;   HERNIA REPAIR     LAPAROSCOPIC CHOLECYSTECTOMY     LEFT ATRIAL APPENDAGE OCCLUSION N/A 06/28/2022   Procedure: LEFT ATRIAL APPENDAGE OCCLUSION;  Surgeon: Sherren Mocha, MD;  Location: [facility];  Service: Cardiovascular;  Laterality: N/A;   MELANOMA EXCISION Right    "neck"   NM MYOCAR PERF WALL MOTION  02/21/2012   EF 61% ,EXERCISE 7 METS. exercise stopped due to wheezing and shortness of breathe   POLYPECTOMY  05/16/2017   Procedure: POLYPECTOMY;  Surgeon: Rogene Houston, MD;  [facility];  Service: Endoscopy;;  colon   POLYPECTOMY  05/24/2022   Procedure: POLYPECTOMY;  Surgeon: Daneil Dolin, MD;  [facility];  Service: Endoscopy;;   PROSTATECTOMY     SHOULDER OPEN ROTATOR CUFF REPAIR Right X 2   SUBMUCOSAL TATTOO INJECTION  05/30/2022   Procedure: SUBMUCOSAL TATTOO INJECTION;  Surgeon: Daneil Dolin, MD;  [facility];  Service: Endoscopy;;   TEE WITHOUT CARDIOVERSION N/A 09/05/2015   Procedure: TRANSESOPHAGEAL ECHOCARDIOGRAM (TEE);  Surgeon: Sueanne Margarita, MD;  Location: [facility];  Service: Cardiovascular;  Laterality: N/A;   TEE WITHOUT CARDIOVERSION N/A 06/28/2022   Procedure: TRANSESOPHAGEAL ECHOCARDIOGRAM (TEE);  Surgeon: Sherren Mocha, MD;  Location: [facility];  Service: Cardiovascular;  Laterality: N/A;   FAMILY HISTORY Family History  Problem Relation Age of Onset   Heart disease Mother    Lung cancer Mother    Heart attack Mother 73   Diabetes Father    Heart disease Father    Stroke Brother    Healthy Daughter    Colon cancer Maternal Aunt        72s   SOCIAL HISTORY Social History   Tobacco Use   Smoking status: Former    Packs/day: 2.00    Years: 27.00    Total pack years: 54.00    Types: Cigarettes    Quit date: 1992    Years since quitting: 32.0    Passive exposure: Current   Smokeless tobacco: Never   Tobacco comments:    Former smoker 09/27/21  Vaping Use    Vaping Use: Never used  Substance Use Topics   Alcohol use: No  Alcohol/week: 0.0 standard drinks of alcohol    Comment: "used to drink; stopped ~ 2008"   Drug use: No       OPHTHALMIC EXAM: Base Eye Exam     Visual Acuity (Snellen - Linear)       Right Left   Dist cc 20/30 -2 20/25 -1   Dist ph cc 20/25 -2 20/25 +1    Correction: Glasses         Tonometry (Tonopen, 2:35 PM)       Right Left   Pressure 19 17         Pupils       Dark Light Shape React APD   Right 2 1 Round Minimal None   Left 2 1 Round Minimal None         Visual Fields (Counting fingers)       Left Right    Full Full         Extraocular Movement       Right Left    Full, Ortho Full, Ortho         Neuro/Psych     Oriented x3: Yes   Mood/Affect: Normal         Dilation     Both eyes: 1.0% Mydriacyl, 2.5% Phenylephrine @ 2:35 PM           Slit Lamp and Fundus Exam     Slit Lamp Exam       Right Left   Lids/Lashes Dermato, mild MGD Dermato, mild MGD   Conjunctiva/Sclera Temporal pinguecula, mild inferior sub conj heme Temporal pinguecula   Cornea EBMD, mild haze, trace PEE, mild Debris in tear film, well healed cataract wound trace haze, trace PEE, well healed temporal cataract wounds, arcus   Anterior Chamber Deep and clear; narrow temporal angle Deep and clear   Iris Round and dilated, mild anterior bowing, No NVI Round and moderately dilated to 5.40m   Lens PCIOL in good position, trace PCO PC IOL in good position with open PC   Anterior Vitreous Synerisis Synerisis         Fundus Exam       Right Left   Disc Mild pallor, sharp rim, +cupping, thin inferior rim Mild pallor, sharp rim, +cupping, +PPA   C/D Ratio 0.7 0.6   Macula Flat, blunted foveal reflex, cystic changes - persistent, RPE mottling and clumping, no heme, Drusen, punctate Exudates Flat, blunted foveal reflex, clusters of fine MA nasal and superior to fovea -- improved, +cystic changes --  slightly improved   Vessels attenuated, Tortuous Attenuated, mild av crossing changes, mild tortuousity   Periphery Attached, rare MA, forcal DBH nasal to disc Attached. No heme.           Refraction     Wearing Rx       Sphere Cylinder Axis Add   Right -0.25 +2.00 158 +2.50   Left -1.00 +1.25 008 +2.50           IMAGING AND PROCEDURES  Imaging and Procedures for 10/12/2022  OCT, Retina - OU - Both Eyes       Right Eye Quality was good. Central Foveal Thickness: 449. Progression has worsened. Findings include no SRF, abnormal foveal contour, intraretinal fluid, vitreomacular adhesion (Persistent IRF/IRHM centrally, interval progression of VMA -- persistent attachment centrally).   Left Eye Quality was good. Central Foveal Thickness: 304. Progression has improved. Findings include no SRF, abnormal foveal contour, intraretinal hyper-reflective material, intraretinal fluid (Interval improvement in  IRF/cystic changes centrally; partial PVD).   Notes *Images captured and stored on drive  Diagnosis / Impression:  DME OU OD: Persistent IRF/IRHM centrally, interval progression of VMA -- persistent attachment centrally OS: Interval improvement in IRF/cystic changes centrally; partial PVD  Clinical management:  See below  Abbreviations: NFP - Normal foveal profile. CME - cystoid macular edema. PED - pigment epithelial detachment. IRF - intraretinal fluid. SRF - subretinal fluid. EZ - ellipsoid zone. ERM - epiretinal membrane. ORA - outer retinal atrophy. ORT - outer retinal tubulation. SRHM - subretinal hyper-reflective material. IRHM - intraretinal hyper-reflective material     Intravitreal Injection, Pharmacologic Agent - OD - Right Eye       Time Out 10/12/2022. 2:55 PM. Confirmed correct patient, procedure, site, and patient consented.   Anesthesia Topical anesthesia was used. Anesthetic medications included Lidocaine 2%, Proparacaine 0.5%.   Procedure Preparation  included 5% betadine to ocular surface, eyelid speculum. A (32g) needle was used.   Injection: 2 mg aflibercept 2 MG/0.05ML   Route: Intravitreal, Site: Right Eye   NDC: A3590391, Lot: [id number], Expiration date: 12/13/2023, Waste: 0 mL   Post-op Post injection exam found visual acuity of at least counting fingers. The patient tolerated the procedure well. There were no complications. The patient received written and verbal post procedure care education.      Intravitreal Injection, Pharmacologic Agent - OS - Left Eye       Time Out 10/12/2022. 2:55 PM. Confirmed correct patient, procedure, site, and patient consented.   Anesthesia Topical anesthesia was used. Anesthetic medications included Lidocaine 2%, Proparacaine 0.5%.   Procedure Preparation included 5% betadine to ocular surface, eyelid speculum. A (32g) needle was used.   Injection: 6 mg faricimab-svoa 6 MG/0.05ML   Route: Intravitreal, Site: Left Eye   NDC: S6832610, Lot: [id number], Expiration date: 10/14/2024, Waste: 0 mL   Post-op Post injection exam found visual acuity of at least counting fingers. The patient tolerated the procedure well. There were no complications. The patient received written and verbal post procedure care education. Post injection medications were not given.            ASSESSMENT/PLAN:   ICD-10-CM   1. 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 Essential hypertension  I10     3. Hypertensive retinopathy of both eyes  H35.033     4. Pseudophakia of both eyes  Z96.1     5. 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 History of herpes zoster of eye  Z86.19     7. Primary open angle glaucoma of both eyes, unspecified glaucoma stage  H40.1130      1. 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 Single focal MA superior to fovea; OS: Perifoveal Mas w/ late leakage - s/p IVA OS # 1(07.26.21), #2 (08.24.21), #3 (09.21.21), #4 (10.22.21) -- IVA resistance - s/p IVE OS #1 (11.22.21), #2 (12.21.21), #3 (01.20.22), #4 (02.18.22), #5 (03.18.22), #6 (04.15.22), #7 (05.10.22) -- sample, #8 (06.08.22), #9 (07.12.22), #10 (08.12.22), #11 (09.16.22), #12 (10.21.22), #13 (11.18.22), #14 (12.21.22), #15 (01.20.23), #16 (  02.17.23), #17 (03.24.23), #18 (04.21.23), #19 (05.26.23), #20 (06.23.23) -- IVE resistance - s/p IVV OS #1 (07.21.23), #2 (08.25.23), #3 (09.29.23), #4 (10.30.23), #5 (11.27.23) - s/p IVE OD #1 (09.29.23), #2 (10.30.23), #3 (SAMPLE) (11.27.23) - BCVA 20/25 OU -- improved from 20/40 OU  - OCT shows OD: Persistent IRF/IRHM centrally, interval progression of VMA -- persistent attachment centrally; OS: Interval improvement in IRF/cystic changes centrally; partial PVD  - recommend IVE OD #4 and IVV OS #6 today, 12.29.23 with f/u in 4 weeks  - pt wishes to proceed with injections  - RBA of procedure discussed, questions answered  - Vabysmo informed consent obtained and signed, 07.21.23 (OS) - see procedure note - IVA informed consent obtained and signed, 07.26.21 (OS) - IVE informed consent obtained and signed, 04.21.23 (OS) - IVE informed consent obtained and signed, 09.29.23 (OD) - Eylea approved until 10/04/23 - [employer] approved Vabysmo - f/u 4 weeks -- DFE/OCT, possible injection  2,3. Hypertensive retinopathy OU - discussed importance of tight BP control - continue to monitor  4. Pseudophakia OU  - s/p CE/IOL OS (2020, Dr. Kathlen Mody)             - s/p CE/IOL OD (2022, Dr. Kathlen Mody)  - s/p YAG cap OS (04.04.23) - BCVA 20/25 from 20/150  - IOLs in  good position, doing well  - continue to monitor  5. 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 Susa Simmonds in August 2022  - s/p SuperK OS on 12.12.2022  6. History of herpes zoster/iritis OS  - on valtrex 1 g daily---maintenance  7. POAG OU  - was under the expert management of Dr. Kathlen Mody, now follows with Dr. Lucianne Lei  - IOP 19,17 today, 12.29.23  - continue cosopt bid OU  Ophthalmic Meds Ordered this visit:  Meds ordered this encounter  Medications   faricimab-svoa (VABYSMO) '6mg'$ /0.40m intravitreal injection   aflibercept (EYLEA) SOLN 2 mg     Return in about 4 weeks (around 11/09/2022) for f/u NPDR OU, DFE, OCT.  There are no Patient Instructions on file for this visit.  This document serves as a record of services personally performed by BGardiner Sleeper MD, PhD. It was created on their behalf by MOrvan Falconer an ophthalmic technician. The creation of this record is the provider's dictation and/or activities during the visit.    Electronically signed by: MOrvan Falconer OA, 10/12/22  3:27 PM  BGardiner Sleeper M.D., Ph.D. Diseases & Surgery of the Retina and Vitreous [facility] I have reviewed the above documentation for accuracy and completeness, and I agree with the above. BGardiner Sleeper M.D., Ph.D. 10/12/22 3:27 PM  Abbreviations: M myopia (nearsighted); A astigmatism; H hyperopia (farsighted); P presbyopia; Mrx spectacle prescription;  CTL contact lenses; OD right eye; OS left eye; OU both eyes  XT exotropia; ET esotropia; PEK punctate epithelial keratitis; PEE punctate epithelial erosions; DES dry eye syndrome; MGD meibomian gland dysfunction; ATs artificial tears; PFAT's preservative free artificial tears; NSummersvillenuclear sclerotic cataract; PSC posterior subcapsular cataract; ERM epi-retinal membrane; PVD posterior vitreous detachment; RD retinal detachment; DM diabetes mellitus; DR diabetic retinopathy; NPDR non-proliferative diabetic  retinopathy; PDR proliferative diabetic retinopathy; CSME clinically significant macular edema; DME diabetic macular edema; dbh dot blot hemorrhages; CWS cotton wool spot; POAG primary open angle glaucoma; C/D cup-to-disc ratio; HVF humphrey visual field; GVF goldmann visual field; OCT optical coherence tomography; IOP intraocular pressure; BRVO Branch retinal vein occlusion; CRVO central retinal vein  occlusion; CRAO central retinal artery occlusion; BRAO branch retinal artery occlusion; RT retinal tear; SB scleral buckle; PPV pars plana vitrectomy; VH Vitreous hemorrhage; PRP panretinal laser photocoagulation; IVK intravitreal kenalog; VMT vitreomacular traction; MH Macular hole;  NVD neovascularization of the disc; NVE neovascularization elsewhere; AREDS age related eye disease study; ARMD age related macular degeneration; POAG primary open angle glaucoma; EBMD epithelial/anterior basement membrane dystrophy; ACIOL anterior chamber intraocular lens; IOL intraocular lens; PCIOL posterior chamber intraocular lens; Phaco/IOL phacoemulsification with intraocular lens placement; Plato photorefractive keratectomy; LASIK laser assisted in situ keratomileusis; HTN hypertension; DM diabetes mellitus; COPD chronic obstructive pulmonary disease

## 2022-10-12 ENCOUNTER — Encounter (INDEPENDENT_AMBULATORY_CARE_PROVIDER_SITE_OTHER): Payer: Self-pay | Admitting: Ophthalmology

## 2022-10-12 ENCOUNTER — Ambulatory Visit (INDEPENDENT_AMBULATORY_CARE_PROVIDER_SITE_OTHER): Payer: Medicare Other | Admitting: Ophthalmology

## 2022-10-12 DIAGNOSIS — H35033 Hypertensive retinopathy, bilateral: Secondary | ICD-10-CM

## 2022-10-12 DIAGNOSIS — H401130 Primary open-angle glaucoma, bilateral, stage unspecified: Secondary | ICD-10-CM

## 2022-10-12 DIAGNOSIS — I10 Essential (primary) hypertension: Secondary | ICD-10-CM

## 2022-10-12 DIAGNOSIS — Z961 Presence of intraocular lens: Secondary | ICD-10-CM | POA: Diagnosis not present

## 2022-10-12 DIAGNOSIS — Z8619 Personal history of other infectious and parasitic diseases: Secondary | ICD-10-CM

## 2022-10-12 DIAGNOSIS — H26492 Other secondary cataract, left eye: Secondary | ICD-10-CM

## 2022-10-12 DIAGNOSIS — E113213 Type 2 diabetes mellitus with mild nonproliferative diabetic retinopathy with macular edema, bilateral: Secondary | ICD-10-CM | POA: Diagnosis not present

## 2022-10-12 DIAGNOSIS — H18523 Epithelial (juvenile) corneal dystrophy, bilateral: Secondary | ICD-10-CM

## 2022-10-12 MED ORDER — AFLIBERCEPT 2MG/0.05ML IZ SOLN FOR KALEIDOSCOPE
2.0000 mg | INTRAVITREAL | Status: AC | PRN
  Administered 2022-10-12: 2 mg via INTRAVITREAL

## 2022-10-12 MED ORDER — FARICIMAB-SVOA 6 MG/0.05ML IZ SOLN
6.0000 mg | INTRAVITREAL | Status: AC | PRN
  Administered 2022-10-12: 6 mg via INTRAVITREAL

## 2022-10-25 IMAGING — CR DG RIBS W/ CHEST 3+V*R*
5 series · 5 of 5 positions shown · non-contrast
Comparison: Chest CT 05/31/2021

CLINICAL DATA: Fell.  Right rib pain.

EXAM:
RIGHT RIBS AND CHEST - 3+ VIEW

[x chest ap (1 of 5)]
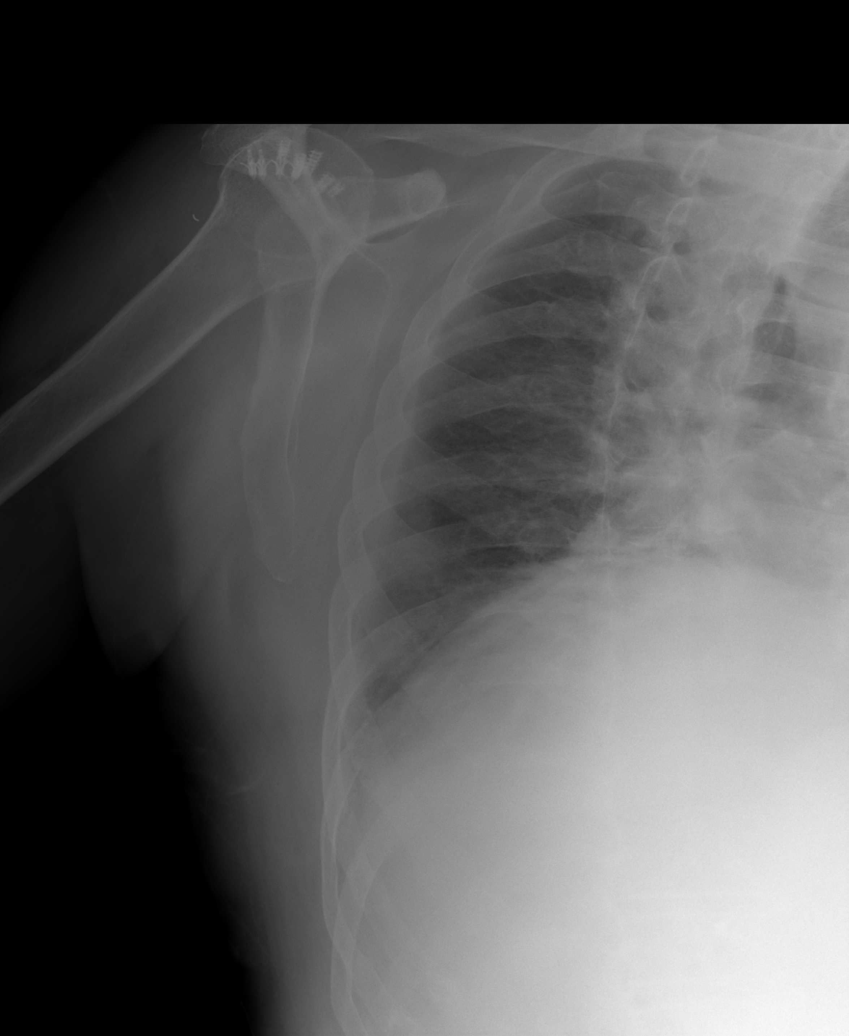

[x chest ap (2 of 5)]
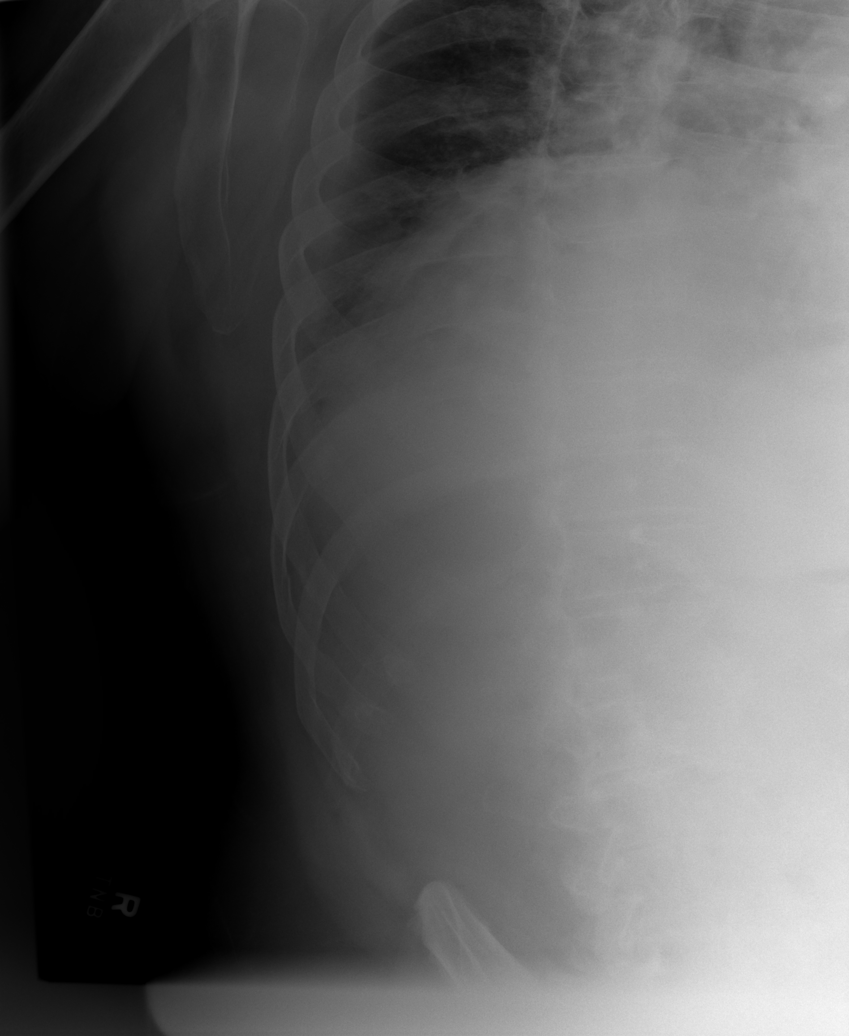

[x chest ap (3 of 5)]
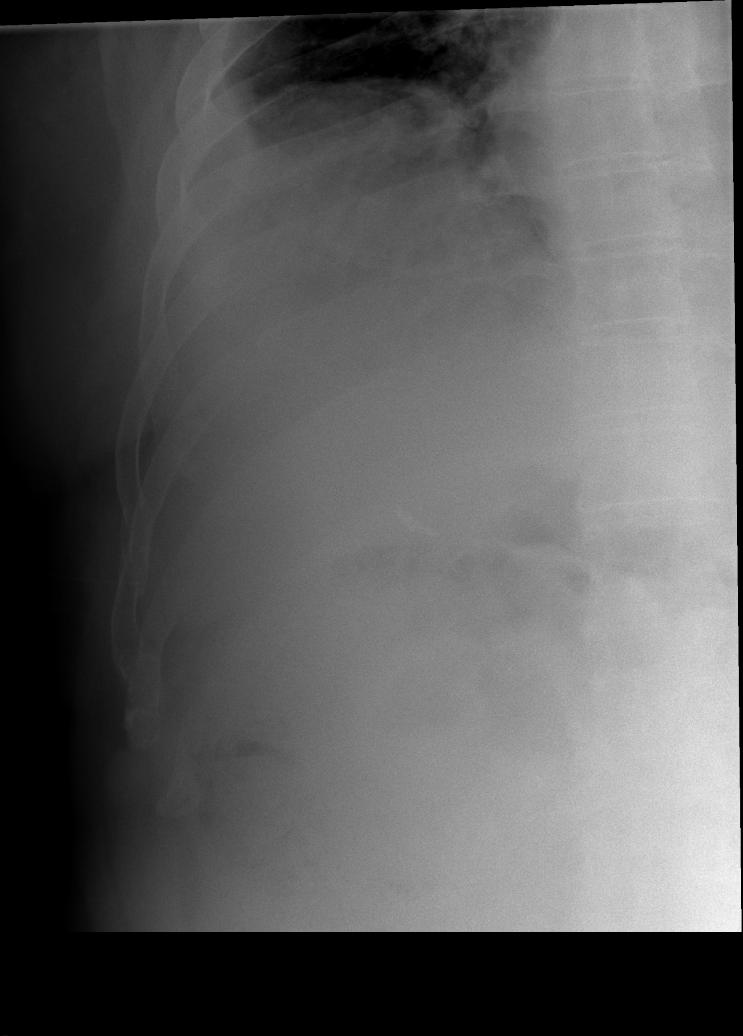

[x chest ap (4 of 5)]
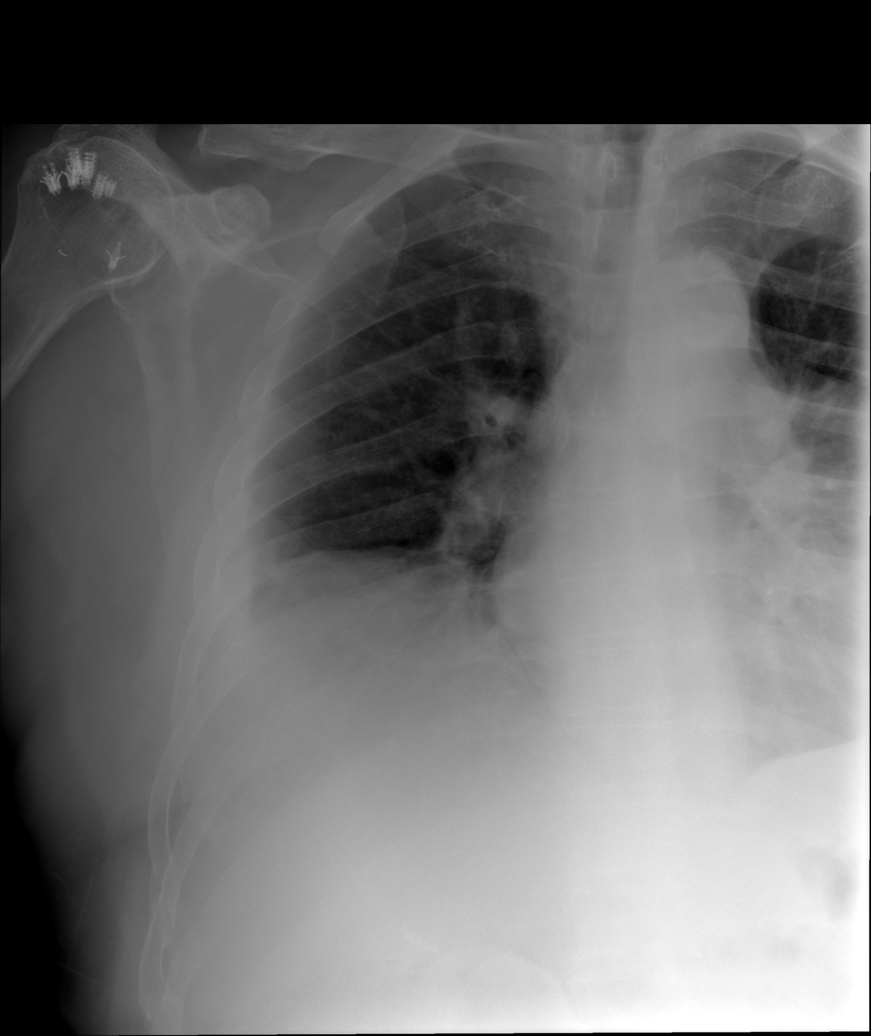

[x chest ap (5 of 5)]
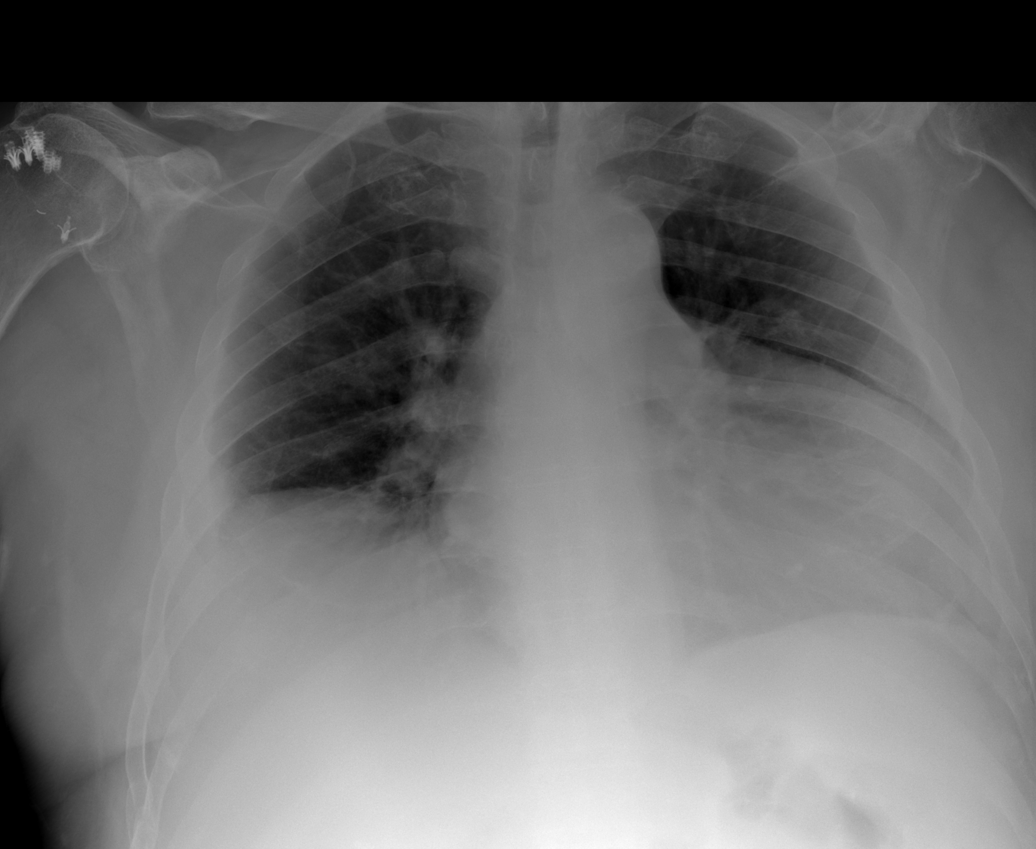

[5 of 5 positions shown; findings below may reference images not displayed]

FINDINGS: The heart is enlarged but appears stable. Moderate eventration of
the right hemidiaphragm. No pneumothorax. Small right pleural
effusion.

Acute nondisplaced right tenth anterior rib fracture noted. Remote
healed right lower rib fractures are also noted.
IMPRESSION: 1. Acute nondisplaced right tenth anterior rib fracture.
2. Small right pleural effusion.
3. No pneumothorax.

## 2022-10-25 IMAGING — CR DG SHOULDER 2+V*R*
2 series · 2 of 2 positions shown · non-contrast
Comparison: Prior chest x-rays.

CLINICAL DATA: Fell.  Right shoulder pain.

EXAM:
RIGHT SHOULDER - 2+ VIEW

[x axillary right (1 of 2)]
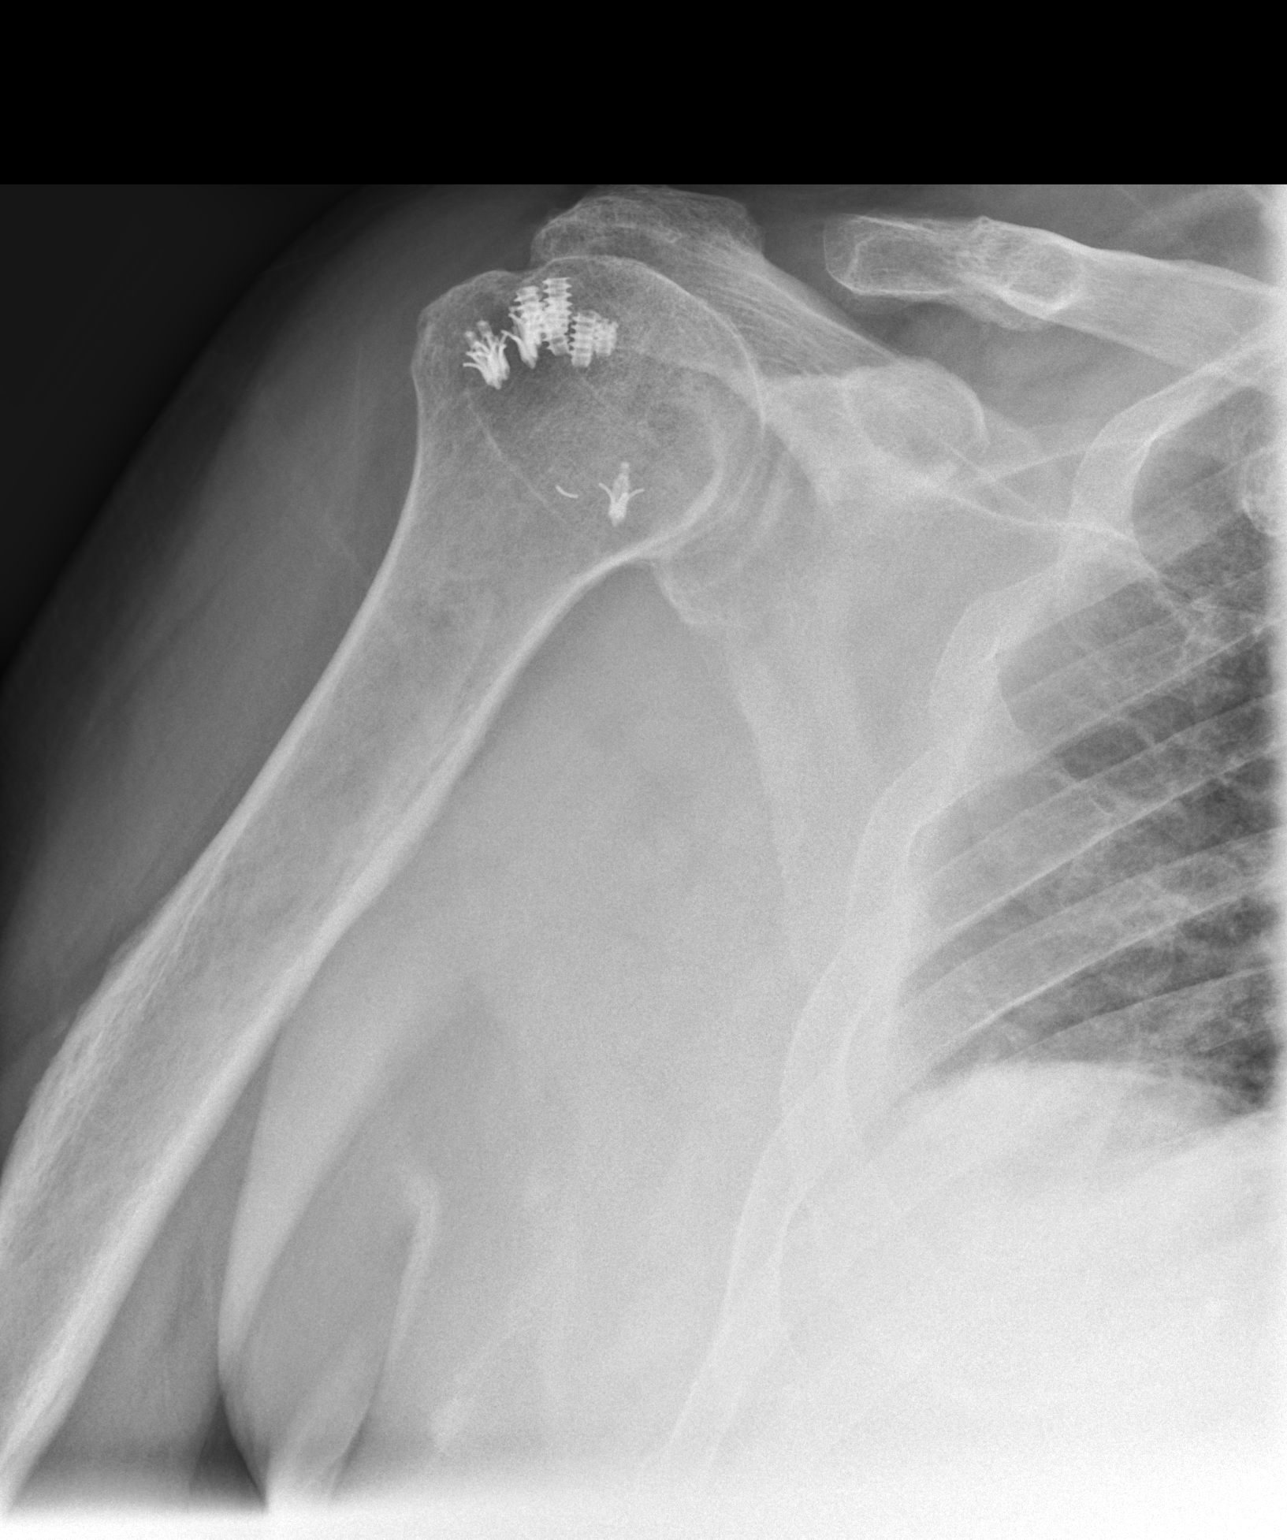

[x axillary right (2 of 2)]
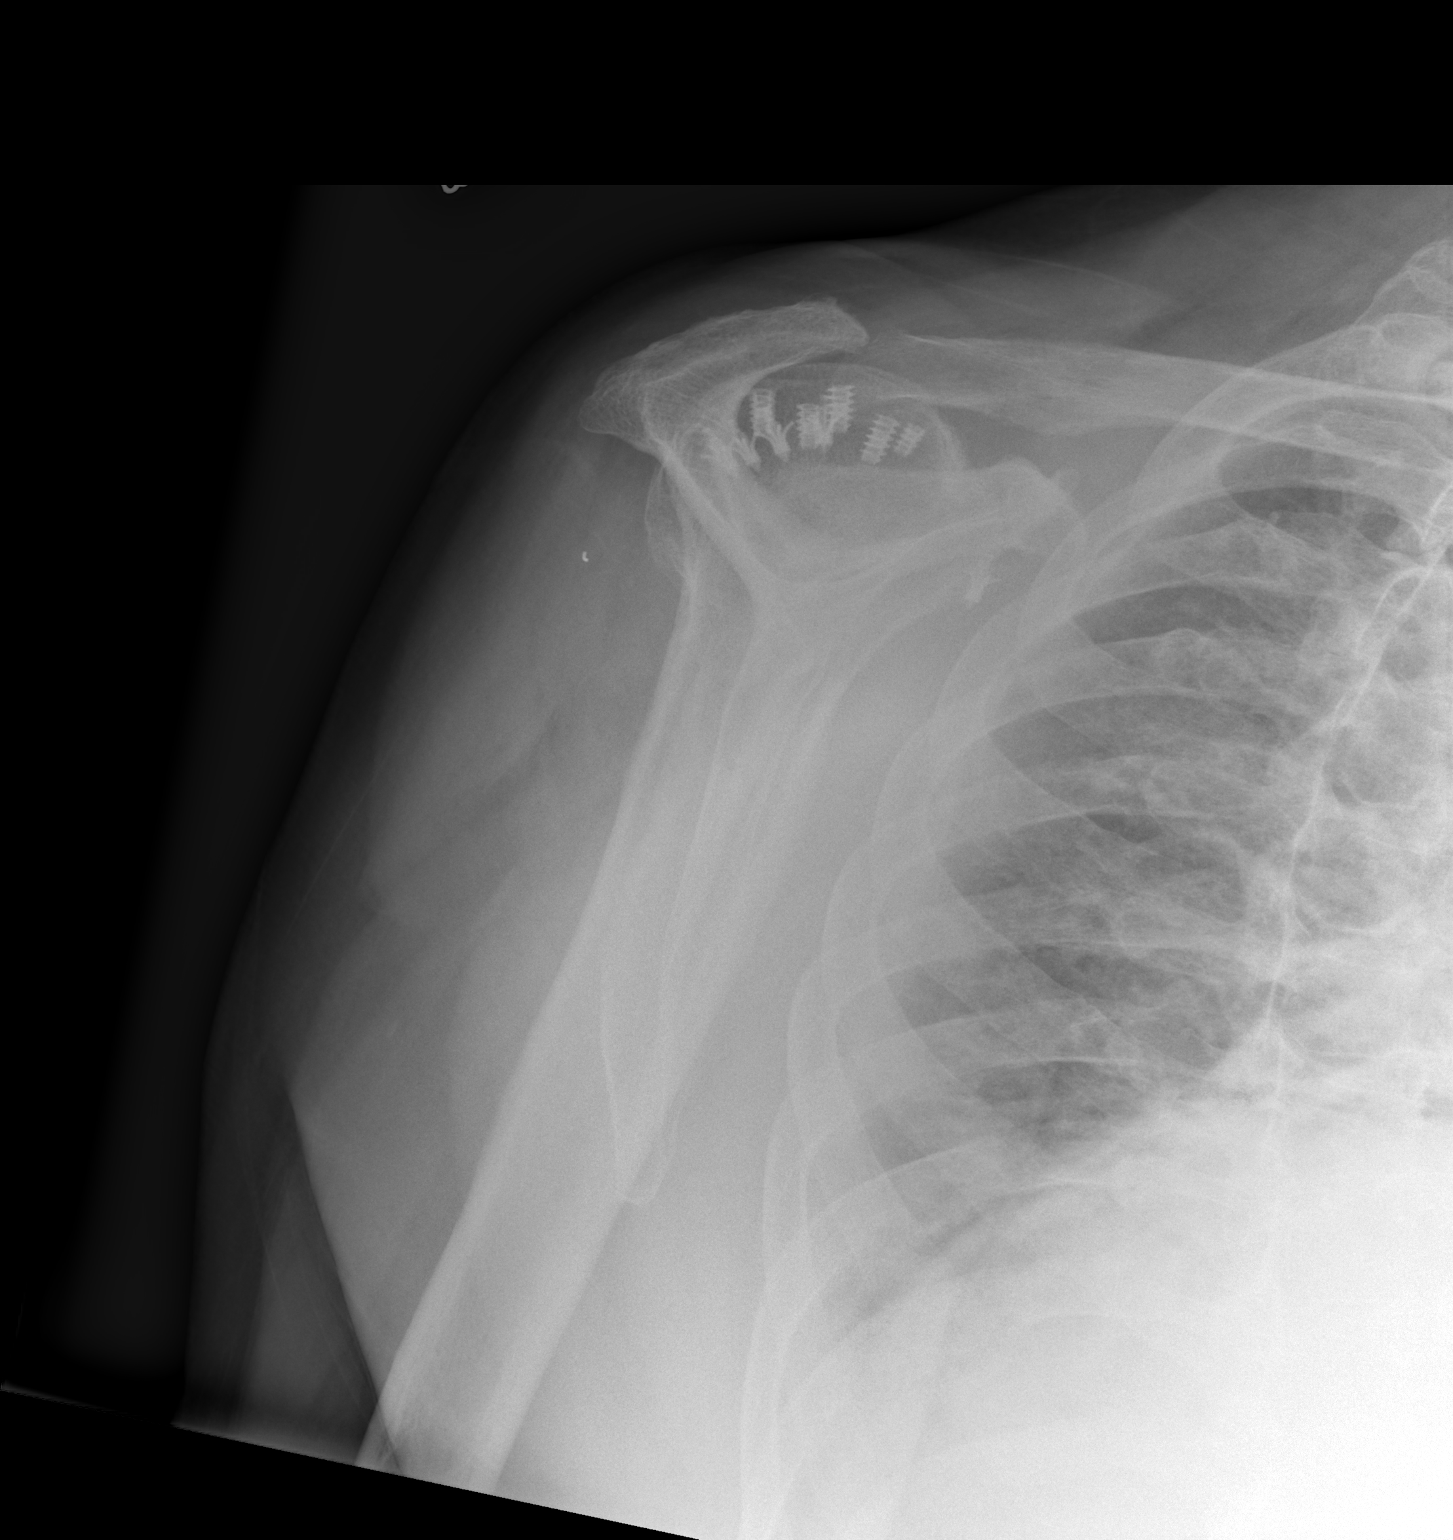

[2 of 2 positions shown; findings below may reference images not displayed]

FINDINGS: Surgical changes noted involving the right shoulder with multiple
bone anchor screws. Prior distal clavicle resection. No acute
fracture or dislocation.
IMPRESSION: Postoperative changes.

No acute bony findings.

## 2022-11-08 NOTE — Progress Notes (Signed)
[facility] Note  11/09/2022    CHIEF COMPLAINT Patient presents for Retina Follow Up  HISTORY OF PRESENT ILLNESS: Lucas Dudley is a 82 y.o. male who presents to the clinic today for:  HPI     Retina Follow Up   Patient presents with  Diabetic Retinopathy.  In both eyes.  This started 4 weeks ago.  I, the attending physician,  performed the HPI with the patient and updated documentation appropriately.        Comments   Patient here for 4 weeks retina follow up for NPDR OU. Patient states vision not doing too good in OS. In the AM can't hardly see. Like there's a film over the eye and eye gets crusty in the AM. Blinking helps. Uses drops helps.       Last edited by Bernarda Caffey, MD on 11/10/2022  1:48 AM.    Patient states has film over vision, especially in am.  Referring physician: [name], MD [place],  [place] [address]  HISTORICAL INFORMATION:   Selected notes from the MEDICAL RECORD NUMBER Referred by Dr. Kathlen Mody for DEE   CURRENT MEDICATIONS: Current Outpatient Medications (Ophthalmic Drugs)  Medication Sig   COMBIGAN 0.2-0.5 % ophthalmic solution Apply 1 drop to eye 2 (two) times daily.   No current facility-administered medications for this visit. (Ophthalmic Drugs)   Current Outpatient Medications (Other)  Medication Sig   albuterol (VENTOLIN HFA) 108 (90 Base) MCG/ACT inhaler Inhale 2 puffs into the lungs every 4 (four) hours as needed for wheezing or shortness of breath.   amiodarone (PACERONE) 200 MG tablet TAKE 1 TABLET BY MOUTH EVERY DAY   amLODipine (NORVASC) 10 MG tablet TAKE 1 TABLET BY MOUTH EVERYDAY AT BEDTIME   Ascorbic Acid (VITAMIN C PO) Take by mouth.   camphor-menthol (ANTI-ITCH) lotion Apply 1 Application topically daily as needed for itching.   carvedilol (COREG) 3.125 MG tablet Take 3.125 mg by mouth 2 (two) times daily with a meal.   cefdinir (OMNICEF) 300 MG capsule Take 300 mg by mouth 2  (two) times daily.   clopidogrel (PLAVIX) 75 MG tablet Take 1 tablet (75 mg total) by mouth daily. START 10/29 AFTER TAKING LAST DOSE OF ELIQUIS 2.5 MG ON 10/28   CRANBERRY PO Take 1 tablet by mouth daily.   cyanocobalamin (VITAMIN B12) 1000 MCG tablet Take 1 tablet (1,000 mcg total) by mouth daily.   diphenhydramine-acetaminophen (TYLENOL PM) 25-500 MG TABS tablet Take 2 tablets by mouth at bedtime.   docusate sodium (COLACE) 100 MG capsule Take 1 capsule (100 mg total) by mouth daily. (Patient taking differently: Take 100 mg by mouth 2 (two) times daily.)   ELDERBERRY PO Take by mouth.   ferrous sulfate 325 (65 FE) MG tablet Take 1 tablet (325 mg total) by mouth daily with breakfast.   gemfibrozil (LOPID) 600 MG tablet Take 600 mg by mouth at bedtime.    glimepiride (AMARYL) 2 MG tablet Take 2 mg by mouth at bedtime.   Insulin Glargine (BASAGLAR KWIKPEN) 100 UNIT/ML Inject 32 Units into the skin daily.   KLOR-CON M20 20 MEQ tablet TAKE 1 TABLET BY MOUTH EVERY DAY   lisinopril (ZESTRIL) 20 MG tablet Take 1 tablet (20 mg total) by mouth daily.   Melatonin 10 MG TABS Take 10 mg by mouth at bedtime.   metFORMIN (GLUCOPHAGE-XR) 500 MG 24 hr tablet Take 1,000 mg by mouth in the morning and at bedtime.  mineral oil liquid Take 15 mLs by mouth daily as needed for moderate constipation.   Multiple Vitamins-Minerals (MULTIVITAMIN WITH MINERALS) tablet Take 1 tablet by mouth daily. Centrum men 50+   Multiple Vitamins-Minerals (ZINC PO) Take by mouth.   Omega-3 1000 MG CAPS Take 1,000 mg by mouth daily.   pantoprazole (PROTONIX) 40 MG tablet Take 1 tablet (40 mg total) by mouth daily.   torsemide (DEMADEX) 20 MG tablet Take 1 tablet (20 mg total) by mouth 2 (two) times daily.   valACYclovir (VALTREX) 1000 MG tablet Take 1,000 mg by mouth at bedtime.   Current Facility-Administered Medications (Other)  Medication Route   0.9 %  sodium chloride infusion Intravenous   0.9 %  sodium chloride infusion  Intravenous   REVIEW OF SYSTEMS: ROS   Positive for: Gastrointestinal, Endocrine, Cardiovascular, Eyes, Respiratory Negative for: Constitutional, Neurological, Skin, Genitourinary, Musculoskeletal, HENT, Psychiatric, Allergic/Imm, Heme/Lymph Last edited by Theodore Demark, COA on 11/09/2022  2:00 PM.     ALLERGIES Allergies  Allergen Reactions   Azithromycin Nausea Only   Tape Rash and Other (See Comments)    Causes skin redness, Use paper tape only.   PAST MEDICAL HISTORY Past Medical History:  Diagnosis Date   Arthritis    Atrial fibrillation (HCC)    CAD (coronary artery disease)    a. Cath 03/17/15 showing 100% ostial D1, 50% prox LAD to mid LAD, 40% RPDA stenosis. Med rx. // Myoview 01/2020: EF 31 diffuse perfusion defect without reversibility (suspect artifact); reviewed with Dr. Aggie Cosier study felt to be low risk   Cataract    Mixed form OD   Chronic diastolic CHF (congestive heart failure) (HCC)    Diabetic retinopathy ([place])    NPDR OU   Essential hypertension    Glaucoma    POAG OU   History of gout    Hyperlipidemia    Hypertensive retinopathy    OU   Kidney stones    Melanoma of neck (HCC)    NICM (nonischemic cardiomyopathy) ([place])    OSA on CPAP 2012   Prostate cancer ([place])    Type II diabetes mellitus ([place])    Past Surgical History:  Procedure Laterality Date   ABDOMINAL HERNIA REPAIR     w/mesh   ATRIAL FIBRILLATION ABLATION N/A 06/06/2021   Procedure: ATRIAL FIBRILLATION ABLATION;  Surgeon: Thompson Grayer, MD;  Location: [facility];  Service: Cardiovascular;  Laterality: N/A;   BIOPSY  05/24/2022   Procedure: BIOPSY;  Surgeon: Daneil Dolin, MD;  [facility];  Service: Endoscopy;;   BIOPSY  05/30/2022   Procedure: BIOPSY;  Surgeon: Daneil Dolin, MD;  [facility];  Service: Endoscopy;;   CARDIAC CATHETERIZATION N/A 03/17/2015   Procedure: Left Heart Cath and Coronary Angiography;  Surgeon: Lorretta Harp, MD;   Location: [facility];  Service: Cardiovascular;  Laterality: N/A;   CARDIOVERSION N/A 08/09/2021   Procedure: CARDIOVERSION;  Surgeon: [name], MD;  Location: [facility];  Service: Cardiovascular;  Laterality: N/A;   CARDIOVERSION N/A 11/10/2021   Procedure: CARDIOVERSION;  Surgeon: Berniece Salines, DO;  Location: [facility];  Service: Cardiovascular;  Laterality: N/A;   carotid doppler  09/17/2008   rigt and left ICAs 0-49%;mildly  abnormal   CATARACT EXTRACTION Left 2020   Dr. Kathlen Mody   COLONOSCOPY N/A 05/16/2017   Procedure: COLONOSCOPY;  Surgeon: Rogene Houston, MD;  [facility];  Service: Endoscopy;  Laterality: N/A;  930  COLONOSCOPY WITH PROPOFOL N/A 05/24/2022   Procedure: COLONOSCOPY WITH PROPOFOL;  Surgeon: Daneil Dolin, MD;  [facility];  Service: Endoscopy;  Laterality: N/A;   DOPPLER ECHOCARDIOGRAPHY  05/25/2009   EF 50-55%,LA mildly dilated, LV function normal   ELECTROPHYSIOLOGIC STUDY N/A 04/05/2015   Procedure: Cardioversion;  Surgeon: Sanda Klein, MD;  Location: [facility];  Service: Cardiovascular;  Laterality: N/A;   ELECTROPHYSIOLOGIC STUDY N/A 09/06/2015   Procedure: Atrial Fibrillation Ablation;  Surgeon: Thompson Grayer, MD;  Location: [facility];  Service: Cardiovascular;  Laterality: N/A;   ELECTROPHYSIOLOGIC STUDY N/A 07/12/2016   redo afib ablation by Dr Rayann Heman   ENTEROSCOPY N/A 05/30/2022   Procedure: ENTEROSCOPY;  Surgeon: Daneil Dolin, MD;  [facility];  Service: Endoscopy;  Laterality: N/A;   ESOPHAGOGASTRODUODENOSCOPY (EGD) WITH PROPOFOL N/A 05/24/2022   Procedure: ESOPHAGOGASTRODUODENOSCOPY (EGD) WITH PROPOFOL;  Surgeon: Daneil Dolin, MD;  [facility];  Service: Endoscopy;  Laterality: N/A;   EXCISIONAL HEMORRHOIDECTOMY     "inside and out"   EYE SURGERY Left 2020   Cat Sx - Dr. Kathlen Mody   FINE NEEDLE ASPIRATION Right    knee; "drew ~ 1 quart off"   GIVENS CAPSULE STUDY  N/A 05/25/2022   Procedure: GIVENS CAPSULE STUDY;  Surgeon: Eloise Harman, DO;  [facility];  Service: Endoscopy;  Laterality: N/A;   HERNIA REPAIR     LAPAROSCOPIC CHOLECYSTECTOMY     LEFT ATRIAL APPENDAGE OCCLUSION N/A 06/28/2022   Procedure: LEFT ATRIAL APPENDAGE OCCLUSION;  Surgeon: Sherren Mocha, MD;  Location: [facility];  Service: Cardiovascular;  Laterality: N/A;   MELANOMA EXCISION Right    "neck"   NM MYOCAR PERF WALL MOTION  02/21/2012   EF 61% ,EXERCISE 7 METS. exercise stopped due to wheezing and shortness of breathe   POLYPECTOMY  05/16/2017   Procedure: POLYPECTOMY;  Surgeon: Rogene Houston, MD;  [facility];  Service: Endoscopy;;  colon   POLYPECTOMY  05/24/2022   Procedure: POLYPECTOMY;  Surgeon: Daneil Dolin, MD;  [facility];  Service: Endoscopy;;   PROSTATECTOMY     SHOULDER OPEN ROTATOR CUFF REPAIR Right X 2   SUBMUCOSAL TATTOO INJECTION  05/30/2022   Procedure: SUBMUCOSAL TATTOO INJECTION;  Surgeon: Daneil Dolin, MD;  [facility];  Service: Endoscopy;;   TEE WITHOUT CARDIOVERSION N/A 09/05/2015   Procedure: TRANSESOPHAGEAL ECHOCARDIOGRAM (TEE);  Surgeon: Sueanne Margarita, MD;  [facility];  Service: Cardiovascular;  Laterality: N/A;   TEE WITHOUT CARDIOVERSION N/A 06/28/2022   Procedure: TRANSESOPHAGEAL ECHOCARDIOGRAM (TEE);  Surgeon: Sherren Mocha, MD;  Location: [facility];  Service: Cardiovascular;  Laterality: N/A;   FAMILY HISTORY Family History  Problem Relation Age of Onset   Heart disease Mother    Lung cancer Mother    Heart attack Mother 7   Diabetes Father    Heart disease Father    Stroke Brother    Healthy Daughter    Colon cancer Maternal Aunt        53s   SOCIAL HISTORY Social History   Tobacco Use   Smoking status: Former    Packs/day: 2.00    Years: 27.00    Total pack years: 54.00    Types: Cigarettes    Quit date: 1992    Years since quitting: 32.0     Passive exposure: Current   Smokeless tobacco: Never   Tobacco comments:  Former smoker 09/27/21  Vaping Use   Vaping Use: Never used  Substance Use Topics   Alcohol use: No    Alcohol/week: 0.0 standard drinks of alcohol    Comment: "used to drink; stopped ~ 2008"   Drug use: No       OPHTHALMIC EXAM: Base Eye Exam     Visual Acuity (Snellen - Linear)       Right Left   Dist cc 20/30 -2 20/25 -1   Dist ph cc NI          Tonometry (Tonopen, 1:57 PM)       Right Left   Pressure 17 13         Pupils       Dark Light Shape React APD   Right 2 1 Round Minimal None   Left 2 1 Round Minimal None         Visual Fields (Counting fingers)       Left Right    Full Full         Extraocular Movement       Right Left    Full, Ortho Full, Ortho         Neuro/Psych     Oriented x3: Yes   Mood/Affect: Normal         Dilation     Both eyes: 1.0% Mydriacyl, 2.5% Phenylephrine @ 1:57 PM           Slit Lamp and Fundus Exam     Slit Lamp Exam       Right Left   Lids/Lashes Dermato, mild MGD Dermato, mild MGD   Conjunctiva/Sclera Temporal pinguecula, mild inferior sub conj heme Temporal pinguecula   Cornea EBMD, mild haze, trace PEE, mild Debris in tear film, well healed cataract wound trace haze, trace PEE, well healed temporal cataract wounds, arcus   Anterior Chamber Deep and clear; narrow temporal angle Deep and clear   Iris Round and dilated, mild anterior bowing, No NVI Round and moderately dilated to 5.23m   Lens PCIOL in good position, trace PCO PC IOL in good position with open PC   Anterior Vitreous Synerisis Synerisis         Fundus Exam       Right Left   Disc Mild pallor, sharp rim, +cupping, thin inferior rim Mild pallor, sharp rim, +cupping, +PPA   C/D Ratio 0.7 0.6   Macula Flat, blunted foveal reflex, cystic changes - improved, RPE mottling and clumping, no heme, Drusen, punctate Exudates Flat, blunted foveal reflex,  clusters of fine MA nasal and superior to fovea -- improved, +cystic changes -- slightly improved   Vessels attenuated, Tortuous Attenuated, mild av crossing changes, mild tortuousity   Periphery Attached, rare MA, forcal DBH nasal to disc--improved Attached. No heme.           Refraction     Wearing Rx       Sphere Cylinder Axis Add   Right -0.25 +2.00 158 +2.50   Left -1.00 +1.25 008 +2.50           IMAGING AND PROCEDURES  Imaging and Procedures for 11/09/2022  OCT, Retina - OU - Both Eyes       Right Eye Quality was good. Central Foveal Thickness: 404. Progression has improved. Findings include no SRF, abnormal foveal contour, intraretinal fluid, vitreomacular adhesion (interval improvement in IRF/IRHM centrally and foveal contour, interval release of VMA to partial PVD).   Left Eye Quality was good. Central  Foveal Thickness: 300. Progression has improved. Findings include no SRF, abnormal foveal contour, intraretinal hyper-reflective material, intraretinal fluid (interval improvement in IRF/cystic changes centrally; partial PVD).   Notes *Images captured and stored on drive  Diagnosis / Impression:  DME OU OD: interval improvement in IRF/IRHM centrally and foveal contour, interval release of VMA to partial PVD OS: interval improvement in IRF/cystic changes centrally; partial PVD  Clinical management:  See below  Abbreviations: NFP - Normal foveal profile. CME - cystoid macular edema. PED - pigment epithelial detachment. IRF - intraretinal fluid. SRF - subretinal fluid. EZ - ellipsoid zone. ERM - epiretinal membrane. ORA - outer retinal atrophy. ORT - outer retinal tubulation. SRHM - subretinal hyper-reflective material. IRHM - intraretinal hyper-reflective material     Intravitreal Injection, Pharmacologic Agent - OD - Right Eye       Time Out 11/09/2022. 3:02 PM. Confirmed correct patient, procedure, site, and patient consented.   Anesthesia Topical  anesthesia was used. Anesthetic medications included Lidocaine 2%, Proparacaine 0.5%.   Procedure Preparation included 5% betadine to ocular surface, eyelid speculum. A (32g) needle was used.   Injection: 2 mg aflibercept 2 MG/0.05ML   Route: Intravitreal, Site: Right Eye   NDC: A3590391, Lot: [id number], Expiration date: 02/12/2023, Waste: 0 mL   Post-op Post injection exam found visual acuity of at least counting fingers. The patient tolerated the procedure well. There were no complications. The patient received written and verbal post procedure care education.      Intravitreal Injection, Pharmacologic Agent - OS - Left Eye       Time Out 11/09/2022. 3:02 PM. Confirmed correct patient, procedure, site, and patient consented.   Anesthesia Topical anesthesia was used. Anesthetic medications included Lidocaine 2%, Proparacaine 0.5%.   Procedure Preparation included 5% betadine to ocular surface, eyelid speculum. A (32g) needle was used.   Injection: 6 mg faricimab-svoa 6 MG/0.05ML   Route: Intravitreal, Site: Left Eye   NDC: S6832610, Lot: [id number], Expiration date: 01/12/2025, Waste: 0 mL   Post-op Post injection exam found visual acuity of at least counting fingers. The patient tolerated the procedure well. There were no complications. The patient received written and verbal post procedure care education. Post injection medications were not given.            ASSESSMENT/PLAN:   ICD-10-CM   1. 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 Essential hypertension  I10     3. Hypertensive retinopathy of both eyes   H35.033     4. Pseudophakia of both eyes  Z96.1     5. 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 History of herpes zoster of eye  Z86.19     7. Primary open angle glaucoma of both eyes, unspecified glaucoma stage  H40.1130       1. 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 Single focal MA superior to fovea; OS: Perifoveal Mas w/ late leakage - s/p IVA OS # 1(07.26.21), #2 (08.24.21), #3 (09.21.21), #4 (10.22.21) -- IVA resistance - s/p IVE OS #1 (11.22.21), #2 (12.21.21), #3 (01.20.22), #4 (  02.18.22), #5 (03.18.22), #6 (04.15.22), #7 (05.10.22) -- sample, #8 (06.08.22), #9 (07.12.22), #10 (08.12.22), #11 (09.16.22), #12 (10.21.22), #13 (11.18.22), #14 (12.21.22), #15 (01.20.23), #16 (02.17.23), #17 (03.24.23), #18 (04.21.23), #19 (05.26.23), #20 (06.23.23) -- IVE resistance - s/p IVV OS #1 (07.21.23), #2 (08.25.23), #3 (09.29.23), #4 (10.30.23), #5 (11.27.23) #6 (12.29.2023) - s/p IVE OD #1 (09.29.23), #2 (10.30.23), #3 (SAMPLE) (11.27.23) #4 (12.29.23) - BCVA 20/30 OD (dec from 202/25), 20/25-2 OS -- stable  - OCT shows OD: interval improvement in IRF/IRHM and foveal contour, interval release of VMA to partial PVD; OS: Interval improvement in IRF/cystic changes centrally; partial PVD  - recommend IVE OD #5 and IVV OS #7 today, 01.26.24 with f/u in 4 weeks  - pt wishes to proceed with injections  - RBA of procedure discussed, questions answered  - Vabysmo informed consent obtained and signed, 07.21.23 (OS) - see procedure note - IVA informed consent obtained and signed, 07.26.21 (OS) - IVE informed consent obtained and signed, 04.21.23 (OS) - IVE informed consent obtained and signed, 09.29.23 (OD) - Eylea approved until 10/04/23 - [employer] approved Vabysmo - f/u 4 weeks -- DFE/OCT, possible injection  2,3. Hypertensive retinopathy OU - discussed importance of tight BP control - continue to monitor  4. Pseudophakia  OU  - s/p CE/IOL OS (2020, Dr. Kathlen Mody)             - s/p CE/IOL OD (2022, Dr. Kathlen Mody)  - s/p YAG cap OS (04.04.23) - BCVA 20/30 OD and 20/25 OS from 20/150  - IOLs in good position, doing well  - continue to monitor  5. 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 Susa Simmonds in August 2022  - s/p SuperK OS on 12.12.2022  6. History of herpes zoster/iritis OS  - on valtrex 1 g daily---maintenance  7. POAG OU  - was under the expert management of Dr. Kathlen Mody, now follows with Dr. Lucianne Lei  - IOP 17,13 today, 01.26.24  - continue cosopt bid OU  Ophthalmic Meds Ordered this visit:  Meds ordered this encounter  Medications   faricimab-svoa (VABYSMO) '6mg'$ /0.36m intravitreal injection   aflibercept (EYLEA) SOLN 2 mg     Return in about 4 weeks (around 12/07/2022) for DME OU - DFE, OCT, Possible Injxn.  There are no Patient Instructions on file for this visit.  This document serves as a record of services personally performed by BGardiner Sleeper MD, PhD. It was created on their behalf by DRoselee Nova COMT. The creation of this record is the provider's dictation and/or activities during the visit.  Electronically signed by: DRoselee Nova COMT 11/10/22 1:48 AM  BGardiner Sleeper M.D., Ph.D. Diseases & Surgery of the Retina and Vitreous [facility] I have reviewed the above documentation for accuracy and completeness, and I agree with the above. BGardiner Sleeper M.D., Ph.D. 11/10/22 1:50 AM   Abbreviations: M myopia (nearsighted); A astigmatism; H hyperopia (farsighted); P presbyopia; Mrx spectacle prescription;  CTL contact lenses; OD right eye; OS left eye; OU both eyes  XT exotropia; ET esotropia; PEK punctate epithelial keratitis; PEE punctate epithelial erosions; DES dry eye syndrome; MGD meibomian gland dysfunction; ATs artificial tears; PFAT's preservative free artificial tears; NMariemontnuclear sclerotic cataract; PSC posterior subcapsular cataract; ERM epi-retinal  membrane; PVD posterior vitreous detachment; RD retinal detachment; DM diabetes mellitus; DR diabetic retinopathy; NPDR non-proliferative diabetic retinopathy; PDR proliferative diabetic retinopathy; CSME clinically significant macular edema; DME diabetic macular edema; dbh dot blot  hemorrhages; CWS cotton wool spot; POAG primary open angle glaucoma; C/D cup-to-disc ratio; HVF humphrey visual field; GVF goldmann visual field; OCT optical coherence tomography; IOP intraocular pressure; BRVO Branch retinal vein occlusion; CRVO central retinal vein occlusion; CRAO central retinal artery occlusion; BRAO branch retinal artery occlusion; RT retinal tear; SB scleral buckle; PPV pars plana vitrectomy; VH Vitreous hemorrhage; PRP panretinal laser photocoagulation; IVK intravitreal kenalog; VMT vitreomacular traction; MH Macular hole;  NVD neovascularization of the disc; NVE neovascularization elsewhere; AREDS age related eye disease study; ARMD age related macular degeneration; POAG primary open angle glaucoma; EBMD epithelial/anterior basement membrane dystrophy; ACIOL anterior chamber intraocular lens; IOL intraocular lens; PCIOL posterior chamber intraocular lens; Phaco/IOL phacoemulsification with intraocular lens placement; [place] photorefractive keratectomy; LASIK laser assisted in situ keratomileusis; HTN hypertension; DM diabetes mellitus; COPD chronic obstructive pulmonary disease

## 2022-11-09 ENCOUNTER — Ambulatory Visit (INDEPENDENT_AMBULATORY_CARE_PROVIDER_SITE_OTHER): Payer: Medicare Other | Admitting: Ophthalmology

## 2022-11-09 ENCOUNTER — Encounter (INDEPENDENT_AMBULATORY_CARE_PROVIDER_SITE_OTHER): Payer: Self-pay | Admitting: Ophthalmology

## 2022-11-09 DIAGNOSIS — Z8619 Personal history of other infectious and parasitic diseases: Secondary | ICD-10-CM

## 2022-11-09 DIAGNOSIS — I10 Essential (primary) hypertension: Secondary | ICD-10-CM | POA: Diagnosis not present

## 2022-11-09 DIAGNOSIS — E113213 Type 2 diabetes mellitus with mild nonproliferative diabetic retinopathy with macular edema, bilateral: Secondary | ICD-10-CM | POA: Diagnosis not present

## 2022-11-09 DIAGNOSIS — H18523 Epithelial (juvenile) corneal dystrophy, bilateral: Secondary | ICD-10-CM

## 2022-11-09 DIAGNOSIS — H401130 Primary open-angle glaucoma, bilateral, stage unspecified: Secondary | ICD-10-CM

## 2022-11-09 DIAGNOSIS — H26492 Other secondary cataract, left eye: Secondary | ICD-10-CM

## 2022-11-09 DIAGNOSIS — Z961 Presence of intraocular lens: Secondary | ICD-10-CM

## 2022-11-09 DIAGNOSIS — H35033 Hypertensive retinopathy, bilateral: Secondary | ICD-10-CM | POA: Diagnosis not present

## 2022-11-09 DIAGNOSIS — H25811 Combined forms of age-related cataract, right eye: Secondary | ICD-10-CM

## 2022-11-09 MED ORDER — FARICIMAB-SVOA 6 MG/0.05ML IZ SOLN
6.0000 mg | INTRAVITREAL | Status: AC | PRN
  Administered 2022-11-09: 6 mg via INTRAVITREAL

## 2022-11-09 MED ORDER — AFLIBERCEPT 2MG/0.05ML IZ SOLN FOR KALEIDOSCOPE
2.0000 mg | INTRAVITREAL | Status: AC | PRN
  Administered 2022-11-09: 2 mg via INTRAVITREAL

## 2022-11-12 ENCOUNTER — Other Ambulatory Visit: Payer: Self-pay

## 2022-11-12 DIAGNOSIS — K269 Duodenal ulcer, unspecified as acute or chronic, without hemorrhage or perforation: Secondary | ICD-10-CM

## 2022-11-12 NOTE — Telephone Encounter (Signed)
EUS scheduled, pt instructed and medications reviewed.  Patient instructions mailed to home.  Patient to call with any questions or concerns.  The pt has also been advised that he should stop plavix 5 days prior to the EUS.

## 2022-11-12 NOTE — Telephone Encounter (Signed)
EGD EUS has been scheduled for 01/10/23 at 1015 am at [facility] with GM

## 2022-11-21 ENCOUNTER — Ambulatory Visit: Payer: Medicare Other | Attending: Cardiology | Admitting: Cardiology

## 2022-11-21 ENCOUNTER — Encounter: Payer: Self-pay | Admitting: Cardiology

## 2022-11-21 VITALS — BP 124/64 | HR 60 | Ht 70.0 in | Wt 235.0 lb

## 2022-11-21 DIAGNOSIS — G4733 Obstructive sleep apnea (adult) (pediatric): Secondary | ICD-10-CM | POA: Diagnosis not present

## 2022-11-21 DIAGNOSIS — I4819 Other persistent atrial fibrillation: Secondary | ICD-10-CM

## 2022-11-21 DIAGNOSIS — I25119 Atherosclerotic heart disease of native coronary artery with unspecified angina pectoris: Secondary | ICD-10-CM | POA: Diagnosis not present

## 2022-11-21 MED ORDER — CLOPIDOGREL BISULFATE 75 MG PO TABS: 75.0000 mg | ORAL_TABLET | Freq: Every day | ORAL | 0 refills | Status: DC

## 2022-11-21 NOTE — Patient Instructions (Addendum)
Medication Instructions:  Your physician has recommended you make the following change in your medication:  Stop clopidogrel on 12/27/2022 Continue other medications the same  Labwork: none  Testing/Procedures: none  Follow-Up: Your physician recommends that you schedule a follow-up appointment in: 6 months  Any Other Special Instructions Will Be Listed Below (If Applicable). Your physician recommends that you schedule a follow-up appointment in: with Dr. Claiborne Billings.  If you need a refill on your cardiac medications before your next appointment, please call your pharmacy.

## 2022-11-21 NOTE — Progress Notes (Signed)
Cardiology Office Note  Date: 11/21/2022   ID: Lucas Dudley, DOB August 25, 1941, MRN [id number]  PCP:  [name], MD  Cardiologist:  Rozann Lesches, MD Electrophysiologist:  Thompson Grayer, MD (Inactive)   Chief Complaint  Patient presents with   Cardiac follow-up    History of Present Illness: Lucas Dudley is a 82 y.o. male last seen in October 2023 with interval follow-up noted in the structural heart clinic.  He is here for a follow-up visit.  His daughter Lucas Dudley was on the phone during our discussion today.  He mentions a few concerns.  One is a "streaking" left-sided thoracic discomfort that occurs while he is eating, after swallowing.  Not necessarily reproducible dysphagia.  This is not exertional and does not sound anginal based on description.  Also has had some daytime somnolence around midday.  He has known OSA on CPAP, not seen in the sleep medicine clinic for a few years however.  We went over his medications.  As per the last structural heart clinic note, Plavix is to be discontinued in mid March.  I reviewed his most recent lab work, hemoglobin had gone up to 11.9 in late November.  He is still following with gastroenterology and has an EGD scheduled in March.  Remainder of his cardiac regimen is otherwise stable as well.  He does not report any sense of palpitations.  Past Medical History:  Diagnosis Date   Arthritis    Atrial fibrillation (HCC)    CAD (coronary artery disease)    a. Cath 03/17/15 showing 100% ostial D1, 50% prox LAD to mid LAD, 40% RPDA stenosis. Med rx. // Myoview 01/2020: EF 31 diffuse perfusion defect without reversibility (suspect artifact); reviewed with Dr. Aggie Cosier study felt to be low risk   Cataract    Mixed form OD   Chronic diastolic CHF (congestive heart failure) (HCC)    Diabetic retinopathy ([place])    NPDR OU   Essential hypertension    Glaucoma    POAG OU   History of gout    Hyperlipidemia    Hypertensive retinopathy    OU    Kidney stones    Melanoma of neck (HCC)    NICM (nonischemic cardiomyopathy) ([place])    OSA on CPAP 2012   Prostate cancer (HCC)    Type II diabetes mellitus (HCC)     Current Outpatient Medications  Medication Sig Dispense Refill   albuterol (VENTOLIN HFA) 108 (90 Base) MCG/ACT inhaler Inhale 2 puffs into the lungs every 4 (four) hours as needed for wheezing or shortness of breath.     amiodarone (PACERONE) 200 MG tablet TAKE 1 TABLET BY MOUTH EVERY DAY 90 tablet 1   Ascorbic Acid (VITAMIN C PO) Take by mouth.     camphor-menthol (ANTI-ITCH) lotion Apply 1 Application topically daily as needed for itching.     carvedilol (COREG) 3.125 MG tablet Take 3.125 mg by mouth 2 (two) times daily with a meal.     COMBIGAN 0.2-0.5 % ophthalmic solution Apply 1 drop to eye 2 (two) times daily.     CRANBERRY PO Take 1 tablet by mouth daily.     cyanocobalamin (VITAMIN B12) 1000 MCG tablet Take 1 tablet (1,000 mcg total) by mouth daily.     diphenhydramine-acetaminophen (TYLENOL PM) 25-500 MG TABS tablet Take 2 tablets by mouth at bedtime.     docusate sodium (COLACE) 100 MG capsule Take 1 capsule (100 mg total) by mouth daily. (Patient taking differently: Take  100 mg by mouth 2 (two) times daily.) 60 capsule 2   ELDERBERRY PO Take by mouth.     ferrous sulfate 325 (65 FE) MG tablet Take 1 tablet (325 mg total) by mouth daily with breakfast. 120 tablet 1   gemfibrozil (LOPID) 600 MG tablet Take 600 mg by mouth at bedtime.      glimepiride (AMARYL) 2 MG tablet Take 2 mg by mouth at bedtime.     Insulin Glargine (BASAGLAR KWIKPEN) 100 UNIT/ML Inject 32 Units into the skin daily.     KLOR-CON M20 20 MEQ tablet TAKE 1 TABLET BY MOUTH EVERY DAY 90 tablet 1   lisinopril (ZESTRIL) 20 MG tablet Take 1 tablet (20 mg total) by mouth daily. 90 tablet 1   Melatonin 10 MG TABS Take 10 mg by mouth at bedtime.     metFORMIN (GLUCOPHAGE-XR) 500 MG 24 hr tablet Take 1,000 mg by mouth in the morning and at bedtime.      mineral oil liquid Take 15 mLs by mouth daily as needed for moderate constipation.     Multiple Vitamins-Minerals (MULTIVITAMIN WITH MINERALS) tablet Take 1 tablet by mouth daily. Centrum men 50+     Omega-3 1000 MG CAPS Take 1,000 mg by mouth daily.     pantoprazole (PROTONIX) 40 MG tablet Take 1 tablet (40 mg total) by mouth daily. 90 tablet 3   torsemide (DEMADEX) 20 MG tablet Take 1 tablet (20 mg total) by mouth 2 (two) times daily. 180 tablet 1   triamcinolone cream (KENALOG) 0.1 % Apply topically 2 (two) times daily as needed.     valACYclovir (VALTREX) 1000 MG tablet Take 1,000 mg by mouth at bedtime.     clopidogrel (PLAVIX) 75 MG tablet Take 1 tablet (75 mg total) by mouth daily. START 10/29 AFTER TAKING LAST DOSE OF ELIQUIS 2.5 MG ON 10/28 36 tablet 0   Current Facility-Administered Medications  Medication Dose Route Frequency Provider Last Rate Last Admin   0.9 %  sodium chloride infusion   Intravenous Continuous Sherren Mocha, MD       0.9 %  sodium chloride infusion   Intravenous Continuous Sherren Mocha, MD       Allergies:  Azithromycin and Tape   ROS: No orthopnea or PND.  No syncope.  Physical Exam: VS:  BP 124/64   Pulse 60   Ht '5\' 10"'$  (1.778 m)   Wt 235 lb (106.6 kg)   SpO2 96%   BMI 33.72 kg/m , BMI Body mass index is 33.72 kg/m.  Wt Readings from Last 3 Encounters:  11/21/22 235 lb (106.6 kg)  09/25/22 236 lb (107 kg)  09/11/22 239 lb 8 oz (108.6 kg)    General: Patient appears comfortable at rest. HEENT: Conjunctiva and lids normal. Neck: Supple, no elevated JVP or carotid bruits. Lungs: Clear to auscultation, nonlabored breathing at rest. Cardiac: Regular rate and rhythm, no S3, 1/6 systolic murmur. Extremities: No pitting edema.  ECG:  An ECG dated 07/24/2022 was personally reviewed today and demonstrated:  Sinus rhythm with prolonged PR interval and left anterior fascicular block, decreased R wave progression.  Recent Labwork: 05/21/2022: B  Natriuretic Peptide 166.2; TSH 3.037 06/01/2022: Magnesium 1.6 08/01/2022: ALT 11; AST 15; BUN 22; Creatinine, Ser 1.32; Potassium 5.2; Sodium 142 09/11/2022: Hemoglobin 11.9; Platelets 254   Other Studies Reviewed Today:  TEE 06/28/2022:  1. Left ventricular ejection fraction, by estimation, is 55 to 60%. The  left ventricle has normal function. The left ventricle has  no regional  wall motion abnormalities.   2. Right ventricular systolic function is normal. The right ventricular  size is normal. Tricuspid regurgitation signal is inadequate for assessing  PA pressure.   3. After the procedure there is a well-seated 31 mm Watchman FLX  appendage occlusion device, with adequate compression and no evidence of  peridevice leak. After the procedure there is a tiny atrial septal defect  in the fossa ovalis, with exclusively  left-to-right shunt. Left atrial size was mild to moderately dilated. No  left atrial/left atrial appendage thrombus was detected.   4. Right atrial size was mildly dilated.   5. The mitral valve is normal in structure. Mild mitral valve  regurgitation.   6. The aortic valve is tricuspid. Aortic valve regurgitation is not  visualized. No aortic stenosis is present.   7. There is mild (Grade II) layered plaque.  Assessment and Plan:  1.  Persistent atrial fibrillation with CHA2DS2-VASc score of 5.  He is status post Watchman device implantation by Dr. [name] and is nearing end of 29-monthtreatment course on Plavix (to stop on March 14 per structural heart clinic note).  He does not report any substantial palpitations on amiodarone otherwise.  I reviewed his most recent lab work.  Last TSH and LFTs were normal.  2.  History of recurrent GI bleeding with prior documented multiple small bowel AVMs.  He continues to follow with gastroenterology.  3.  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  Recent  reported fleeting thoracic discomfort does not sound clearly anginal based on description, more likely GI related.  Continue Coreg and Lopid.  4.  OSA on CPAP, referring back to the sleep medicine clinic given reported daytime somnolence.  May need follow-up testing or adjustment in therapy.  Medication Adjustments/Labs and Tests Ordered: Current medicines are reviewed at length with the patient today.  Concerns regarding medicines are outlined above.   Tests Ordered: No orders of the defined types were placed in this encounter.   Medication Changes: Meds ordered this encounter  Medications   clopidogrel (PLAVIX) 75 MG tablet    Sig: Take 1 tablet (75 mg total) by mouth daily. START 10/29 AFTER TAKING LAST DOSE OF ELIQUIS 2.5 MG ON 10/28    Dispense:  36 tablet    Refill:  0    Stop on 12/27/2022    Disposition:  Follow up  6 months.  Signed, SSatira Sark MD, FGood [facility]2/04/2023 3:27 PM    [address] [place] [place] [id number]: [phone] Fax: [phone]

## 2022-11-22 ENCOUNTER — Encounter (HOSPITAL_COMMUNITY): Payer: Self-pay | Admitting: *Deleted

## 2022-11-26 ENCOUNTER — Other Ambulatory Visit: Payer: Self-pay | Admitting: Cardiology

## 2022-11-27 ENCOUNTER — Ambulatory Visit (INDEPENDENT_AMBULATORY_CARE_PROVIDER_SITE_OTHER): Payer: Medicare Other | Admitting: Gastroenterology

## 2022-11-27 ENCOUNTER — Encounter (INDEPENDENT_AMBULATORY_CARE_PROVIDER_SITE_OTHER): Payer: Self-pay | Admitting: Gastroenterology

## 2022-11-27 VITALS — BP 138/70 | HR 76 | Temp 97.6°F | Ht 70.0 in | Wt 236.6 lb

## 2022-11-27 DIAGNOSIS — K5900 Constipation, unspecified: Secondary | ICD-10-CM | POA: Diagnosis not present

## 2022-11-27 DIAGNOSIS — K5520 Angiodysplasia of colon without hemorrhage: Secondary | ICD-10-CM | POA: Diagnosis not present

## 2022-11-27 DIAGNOSIS — D500 Iron deficiency anemia secondary to blood loss (chronic): Secondary | ICD-10-CM | POA: Diagnosis not present

## 2022-11-27 NOTE — Progress Notes (Unsigned)
Referring Provider: [name], MD Primary Care Physician:  [name], MD Primary GI Physician: Jenetta Downer   Chief Complaint  Patient presents with   Gastroesophageal Reflux    Follow up on GERD, constipation and IDA.    HPI:   Lucas Dudley is a 82 y.o. male with past medical history of atrial fibrillation on chronic anticoagulation, systolic CHF, hypertension, diabetes   Patient presenting today for follow up of anemia, constipation.  Last seen November 2023, at that time having some issues with constipation and feels that he is having to strain a lot still to have a BM. He is taking 2 stool softeners and 1/2 tsp. He is worried about taking more miralax as he does not want to have diarrhea. He is having a BM every other day, states that initially stools are hard initially but are soft after the initial stool passes. He states that stools are large in volume, dark green in color but denies rectal bleeding. He reports that he thinks he may have some hemorrhoids that he is having to push back inside of him after his BMs. Weight is stable today. stopped protonix due to concern for side effects, having a lot of belching.    Patient did watchman procedure in September, Currently on plavix. Plan per [facility] was to wait 2-3 months s/p Watchman procedure to perform EUS/enteroscopy. After consult with cardiology, was recommended to wait for procedures until feb/march time to be able to safely hold plavix.    Recommended recheck iron levels, continue PO Iron BID, referral to [facility] for EUS/enteroscopy for duodenal nodule, start miralax, Rx protonix 55m daily.  Present:  Patient reports ongoing intermittent fatigue. Feeling very tired today. Has some SOB with exertion. Denies rectal bleeding or melena. Denies dizziness. denies GERD symptoms he is taking protonix 434monce daily and doing well on this. Denies dysphagia or odynophagia. No nausea or vomiting.   Normocytic anemia: hgb 11.9,  iron 68, TIBC 391, saturation 17% ferritin 33 in November, following with hematology. At most recent visit, advised to continue PO iron supplement, start B12 supplementation due to low B12 levels.   Constipation: having a BM maybe every other day. If he has a larger BM one day he likely will not go the next day. Doing about 1t of miralax and stool softener per day. He is having to sit on the toilet for a little while at times. Notes if he waits until he really feels ready to go to the restroom, he does not have to strain and passes stool without issue.  He is still noting some protrusion of hemorrhoids out of his rectum that he has to manually push back in at times. he denies pain, itching or bleeding. Uses wet wipes to get himself clean.   He has EUS scheduled on 3/28 with Dr. 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 Status post ablation. Small bowel nodule -junction distal duodenum/jejunum. Irregular with a submucosal component. Suspicious in appearance. 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  No active bleeding.  Capsule did not reach cecum. Last Colonoscopy:05/24/22- One 5 mm polyp in the sigmoid colon-hyperplastic - Diverticulosis in the descending colon and at the splenic flexure. Redundant and elongated colon. - The examination was otherwise normal on direct and retroflexion views. No blood or clot in the lower GI tract. Given negative evaluation today, small bowel evaluation warranted.  Last EGD 05/24/22- Normal esophagus. - Small hiatal hernia. Friable gastric mucosa. Couple of tiny gastric erosions the significance of which is uncertain. Status post gastric biopsy - Normal duodenal bulb, second portion of the duodenum and third portion of the duodenum.    Past Medical History:  Diagnosis Date   Arthritis    Atrial  fibrillation (HCC)    CAD (coronary artery disease)    a. Cath 03/17/15 showing 100% ostial D1, 50% prox LAD to mid LAD, 40% RPDA stenosis. Med rx. // Myoview 01/2020: EF 31 diffuse perfusion defect without reversibility (suspect artifact); reviewed with Dr. Aggie Cosier study felt to be low risk   Cataract    Mixed form OD   Chronic diastolic CHF (congestive heart failure) (HCC)    Diabetic retinopathy ([place])    NPDR OU   Essential hypertension    Glaucoma    POAG OU   History of gout    Hyperlipidemia    Hypertensive retinopathy    OU   Kidney stones    Melanoma of neck (HCC)    NICM (nonischemic cardiomyopathy) ([place])    OSA on CPAP 2012   Prostate cancer ([place])    Type II diabetes mellitus ([place])     Past Surgical History:  Procedure Laterality Date   ABDOMINAL HERNIA REPAIR     w/mesh   ATRIAL FIBRILLATION ABLATION N/A 06/06/2021   Procedure: ATRIAL FIBRILLATION ABLATION;  Surgeon: Thompson Grayer, MD;  Location: [facility] [facility];  Service: Cardiovascular;  Laterality: N/A;   BIOPSY  05/24/2022   Procedure: BIOPSY;  Surgeon: Daneil Dolin, MD;  [facility];  Service: Endoscopy;;   BIOPSY  05/30/2022   Procedure: BIOPSY;  Surgeon: Daneil Dolin, MD;  [facility];  Service: Endoscopy;;   CARDIAC CATHETERIZATION N/A 03/17/2015   Procedure: Left Heart Cath and Coronary Angiography;  Surgeon: Lorretta Harp, MD;  Location: [facility];  Service: Cardiovascular;  Laterality: N/A;   CARDIOVERSION N/A 08/09/2021   Procedure: CARDIOVERSION;  Surgeon: [name], MD;  Location: [facility];  Service: Cardiovascular;  Laterality: N/A;   CARDIOVERSION N/A 11/10/2021   Procedure: CARDIOVERSION;  Surgeon: Berniece Salines, DO;  Location: [facility];  Service: Cardiovascular;  Laterality: N/A;   carotid doppler  09/17/2008   rigt and left ICAs 0-49%;mildly  abnormal   CATARACT EXTRACTION Left 2020   Dr. Kathlen Mody   COLONOSCOPY N/A 05/16/2017   Procedure:  COLONOSCOPY;  Surgeon: Rogene Houston, MD;  [facility];  Service: Endoscopy;  Laterality: N/A;  930   COLONOSCOPY WITH PROPOFOL N/A 05/24/2022   Procedure: COLONOSCOPY WITH PROPOFOL;  Surgeon: Daneil Dolin, MD;  [facility];  Service: Endoscopy;  Laterality: N/A;   DOPPLER ECHOCARDIOGRAPHY  05/25/2009   EF 50-55%,LA mildly dilated, LV function normal   ELECTROPHYSIOLOGIC STUDY N/A 04/05/2015   Procedure: Cardioversion;  Surgeon: Sanda Klein, MD;  Location: [facility];  Service: Cardiovascular;  Laterality: N/A;   ELECTROPHYSIOLOGIC STUDY N/A 09/06/2015   Procedure: Atrial Fibrillation Ablation;  Surgeon: Thompson Grayer, MD;  Location: [facility];  Service: Cardiovascular;  Laterality: N/A;   ELECTROPHYSIOLOGIC STUDY N/A 07/12/2016   redo afib ablation by Dr Rayann Heman   ENTEROSCOPY N/A 05/30/2022   Procedure: ENTEROSCOPY;  Surgeon: Daneil Dolin, MD;  [facility];  Service: Endoscopy;  Laterality: N/A;   ESOPHAGOGASTRODUODENOSCOPY (EGD) WITH PROPOFOL N/A 05/24/2022   Procedure: ESOPHAGOGASTRODUODENOSCOPY (EGD) WITH PROPOFOL;  Surgeon: Gala Romney,  Cristopher Estimable, MD;  [facility];  Service: Endoscopy;  Laterality: N/A;   EXCISIONAL HEMORRHOIDECTOMY     "inside and out"   EYE SURGERY Left 2020   Cat Sx - Dr. Kathlen Mody   FINE NEEDLE ASPIRATION Right    knee; "drew ~ 1 quart off"   GIVENS CAPSULE STUDY N/A 05/25/2022   Procedure: GIVENS CAPSULE STUDY;  Surgeon: Eloise Harman, DO;  [facility];  Service: Endoscopy;  Laterality: N/A;   HERNIA REPAIR     LAPAROSCOPIC CHOLECYSTECTOMY     LEFT ATRIAL APPENDAGE OCCLUSION N/A 06/28/2022   Procedure: LEFT ATRIAL APPENDAGE OCCLUSION;  Surgeon: Sherren Mocha, MD;  Location: [facility];  Service: Cardiovascular;  Laterality: N/A;   MELANOMA EXCISION Right    "neck"   NM MYOCAR PERF WALL MOTION  02/21/2012   EF 61% ,EXERCISE 7 METS. exercise stopped due to wheezing and shortness  of breathe   POLYPECTOMY  05/16/2017   Procedure: POLYPECTOMY;  Surgeon: Rogene Houston, MD;  [facility];  Service: Endoscopy;;  colon   POLYPECTOMY  05/24/2022   Procedure: POLYPECTOMY;  Surgeon: Daneil Dolin, MD;  [facility];  Service: Endoscopy;;   PROSTATECTOMY     SHOULDER OPEN ROTATOR CUFF REPAIR Right X 2   SUBMUCOSAL TATTOO INJECTION  05/30/2022   Procedure: SUBMUCOSAL TATTOO INJECTION;  Surgeon: Daneil Dolin, MD;  [facility];  Service: Endoscopy;;   TEE WITHOUT CARDIOVERSION N/A 09/05/2015   Procedure: TRANSESOPHAGEAL ECHOCARDIOGRAM (TEE);  Surgeon: Sueanne Margarita, MD;  [facility];  Service: Cardiovascular;  Laterality: N/A;   TEE WITHOUT CARDIOVERSION N/A 06/28/2022   Procedure: TRANSESOPHAGEAL ECHOCARDIOGRAM (TEE);  Surgeon: Sherren Mocha, MD;  Location: [place];  Service: Cardiovascular;  Laterality: N/A;    Current Outpatient Medications  Medication Sig Dispense Refill   albuterol (VENTOLIN HFA) 108 (90 Base) MCG/ACT inhaler Inhale 2 puffs into the lungs every 4 (four) hours as needed for wheezing or shortness of breath.     amiodarone (PACERONE) 200 MG tablet TAKE 1 TABLET BY MOUTH EVERY DAY 90 tablet 1   Ascorbic Acid (VITAMIN C PO) Take by mouth.     camphor-menthol (ANTI-ITCH) lotion Apply 1 Application topically daily as needed for itching.     carvedilol (COREG) 3.125 MG tablet TAKE 1 TABLET BY MOUTH 2 TIMES DAILY. 180 tablet 3   clopidogrel (PLAVIX) 75 MG tablet Take 1 tablet (75 mg total) by mouth daily. START 10/29 AFTER TAKING LAST DOSE OF ELIQUIS 2.5 MG ON 10/28 36 tablet 0   COMBIGAN 0.2-0.5 % ophthalmic solution Apply 1 drop to eye 2 (two) times daily.     CRANBERRY PO Take 1 tablet by mouth daily.     cyanocobalamin (VITAMIN B12) 1000 MCG tablet Take 1 tablet (1,000 mcg total) by mouth daily.     diphenhydramine-acetaminophen (TYLENOL PM) 25-500 MG TABS tablet Take 2 tablets by mouth at bedtime.      docusate sodium (COLACE) 100 MG capsule Take 1 capsule (100 mg total) by mouth daily. (Patient taking differently: Take 100 mg by mouth 2 (two) times daily.) 60 capsule 2   ELDERBERRY PO Take by mouth.     ferrous sulfate 325 (65 FE) MG tablet Take 1 tablet (325 mg total) by mouth daily with breakfast. 120 tablet 1   gemfibrozil (LOPID) 600 MG tablet Take 600 mg by mouth at bedtime.      glimepiride (AMARYL) 2  MG tablet Take 2 mg by mouth at bedtime.     Insulin Glargine (BASAGLAR KWIKPEN) 100 UNIT/ML Inject 32 Units into the skin daily.     KLOR-CON M20 20 MEQ tablet TAKE 1 TABLET BY MOUTH EVERY DAY 90 tablet 1   lisinopril (ZESTRIL) 20 MG tablet Take 1 tablet (20 mg total) by mouth daily. 90 tablet 1   Melatonin 10 MG TABS Take 10 mg by mouth at bedtime.     metFORMIN (GLUCOPHAGE-XR) 500 MG 24 hr tablet Take 1,000 mg by mouth in the morning and at bedtime.     mineral oil liquid Take 15 mLs by mouth daily as needed for moderate constipation.     Multiple Vitamins-Minerals (MULTIVITAMIN WITH MINERALS) tablet Take 1 tablet by mouth daily. Centrum men 50+     Omega-3 1000 MG CAPS Take 1,000 mg by mouth daily.     pantoprazole (PROTONIX) 40 MG tablet Take 1 tablet (40 mg total) by mouth daily. 90 tablet 3   torsemide (DEMADEX) 20 MG tablet Take 1 tablet (20 mg total) by mouth 2 (two) times daily. 180 tablet 1   triamcinolone cream (KENALOG) 0.1 % Apply topically 2 (two) times daily as needed.     valACYclovir (VALTREX) 1000 MG tablet Take 1,000 mg by mouth at bedtime.     Current Facility-Administered Medications  Medication Dose Route Frequency Provider Last Rate Last Admin   0.9 %  sodium chloride infusion   Intravenous Continuous Sherren Mocha, MD       0.9 %  sodium chloride infusion   Intravenous Continuous Sherren Mocha, MD        Allergies as of 11/27/2022 - Review Complete 11/27/2022  Allergen Reaction Noted   Azithromycin Nausea Only 11/10/2021   Tape Rash and Other (See  Comments) 01/28/2012    Family History  Problem Relation Age of Onset   Heart disease Mother    Lung cancer Mother    Heart attack Mother 76   Diabetes Father    Heart disease Father    Stroke Brother    Healthy Daughter    Colon cancer Maternal Aunt        71s    Social History   Socioeconomic History   Marital status: Widowed    Spouse name: Not on file   Number of children: 1   Years of education: Not on file   Highest education level: Not on file  Occupational History   Occupation: Retired  Tobacco Use   Smoking status: Former    Packs/day: 2.00    Years: 27.00    Total pack years: 54.00    Types: Cigarettes    Quit date: 1992    Years since quitting: 32.1    Passive exposure: Current   Smokeless tobacco: Never   Tobacco comments:    Former smoker 09/27/21  Vaping Use   Vaping Use: Never used  Substance and Sexual Activity   Alcohol use: No    Alcohol/week: 0.0 standard drinks of alcohol    Comment: "used to drink; stopped ~ 2008"   Drug use: No   Sexual activity: Not Currently  Other Topics Concern   Not on file  Social History Narrative   Lives in [place], [place] with wife.   Social Determinants of Health   Financial Resource Strain: Not on file  Food Insecurity: No Food Insecurity (09/11/2022)   Hunger Vital Sign    Worried About Running Out of Food in the Last Year: Never true  Ran Out of Food in the Last Year: Never true  Transportation Needs: No Transportation Needs (09/11/2022)   PRAPARE - Hydrologist (Medical): No    Lack of Transportation (Non-Medical): No  Physical Activity: Not on file  Stress: Not on file  Social Connections: Not on file    Review of systems General: negative for night sweats, fever, chills, weight loss +fatigue  Neck: Negative for lumps, goiter, pain and significant neck swelling Resp: Negative for cough, wheezing, dyspnea at rest CV: Negative for chest pain, leg swelling, palpitations,  orthopnea GI: denies melena, hematochezia, nausea, vomiting, diarrhea, dysphagia, odyonophagia, early satiety or unintentional weight loss. +constipation  MSK: Negative for joint pain or swelling, back pain, and muscle pain. Derm: Negative for itching or rash Psych: Denies depression, anxiety, memory loss, confusion. No homicidal or suicidal ideation.  Heme: Negative for prolonged bleeding, bruising easily, and swollen nodes. Endocrine: Negative for cold or heat intolerance, polyuria, polydipsia and goiter. Neuro: negative for tremor, gait imbalance, syncope and seizures. The remainder of the review of systems is noncontributory.  Physical Exam: BP 138/70 (BP Location: Left Arm, Patient Position: Sitting, Cuff Size: Large)   Pulse 76   Temp 97.6 F (36.4 C) (Oral)   Ht 5' 10"$  (1.778 m)   Wt 236 lb 9.6 oz (107.3 kg)   BMI 33.95 kg/m  General:   Alert and oriented. No distress noted. Pleasant and cooperative.  Head:  Normocephalic and atraumatic. Eyes:  Conjuctiva clear without scleral icterus. Mouth:  Oral mucosa pink and moist. Good dentition. No lesions. Heart: Normal rate and rhythm, s1 and s2 heart sounds present.  Lungs: Clear lung sounds in all lobes. Respirations equal and unlabored. Abdomen:  +BS, soft, non-tender and non-distended. No rebound or guarding. No HSM or masses noted. Derm: No palmar erythema or jaundice Msk:  Symmetrical without gross deformities. Normal posture. Extremities:  Without edema. Neurologic:  Alert and  oriented x4 Psych:  Alert and cooperative. Normal mood and affect.  Invalid input(s): "6 MONTHS"   ASSESSMENT: MARLONE PARADISO is a 82 y.o. male presenting today for follow up of GERD, IDA, constipation.  Normocytic Anemia: multifactorial. Previous extensive endoscopic evaluation with presence of AVMs.  recent B12 levels quite low. Iron studies normal other than slightly low saturation of 17%. Hgb 11.9.  Maintained on PO iron and received B12  injection in November, advised to take PO B12 by Hematology. Denies rectal bleeding or melena. Has ongoing fatigue, likely also multifactorial in setting of IDA and from low B12 levels. Taking PPI daily prophylactically. Should continue with current regimen and continue to follow with hematology. He will make me aware of any rectal bleeding or melena.  Notably,  duodenal nodule found on previous endoscopic evaluations to be followed up on via EGD/EUS with Dr. Rush Landmark of [facility] in march.   Constipation: doing stool softener and small amount of miralax daily. He is concerned about increasing miralax due to worry for diarrhea. Having to strain some though reports if he waits until he really has the urge to defecate, he does not. Feels that current regimen is working well for him. Does continue to report what sounds like prolapsing hemorrhoids though declines rectal exam. I did encourage him to have good water intake, increase fruits, veggies and whole grains in diet. He can try increasing miralax to 1 capful per day. Should avoid straining and limit toilet time.    PLAN:  Continue protonix 28m daily  2. Continue  PO iron pill  3. Increase miralax to 1 capful per day 4. Good water intake, diet high in fruits, veggies and whole grains 5. Continue to follow with hematology 6. Keep EGD/EUS appt on march 28th  All questions were answered, patient verbalized understanding and is in agreement with plan as outlined above.    Follow Up: 6 months    Lucas Dudley L. Alver Sorrow, MSN, APRN, AGNP-C Adult-Gerontology Nurse Practitioner [facility]  I have reviewed the note and agree with the APP's assessment as described in this progress note  Lucas Peppers, MD Gastroenterology and Hepatology [facility]

## 2022-11-27 NOTE — Patient Instructions (Addendum)
Continue protonix 20m daily  Continue iron pill daily If stools are hard or you are having to strain, please increase miralax to 1 capful per day Continue with good water intake, diet high in fruits, veggies and whole grains Continue to follow with hematology (blood doctor) upcoming appt is listed below  Keep EGD/EUS procedure appt on march 28th with Dr. MRush Landmark Follow up 6 months  It was a pleasure to see you today. I want to create trusting relationships with patients and provide genuine, compassionate, and quality care. I truly value your feedback! please be on the lookout for a survey regarding your visit with me today. I appreciate your input about our visit and your time in completing this!    Lucas Dudley L. CAlver Sorrow MSN, APRN, AGNP-C Adult-Gerontology Nurse Practitioner [facility]

## 2022-11-28 ENCOUNTER — Other Ambulatory Visit: Payer: Self-pay | Admitting: Cardiology

## 2022-11-28 NOTE — Addendum Note (Signed)
Addended by: Harvel Quale on: 11/28/2022 01:40 PM   Modules accepted: Level of Service

## 2022-12-06 NOTE — Progress Notes (Signed)
[facility] Note  12/07/2022    CHIEF COMPLAINT Patient presents for Retina Follow Up  HISTORY OF PRESENT ILLNESS: Lucas Dudley is a 82 y.o. male who presents to the clinic today for:  HPI     Retina Follow Up   Patient presents with  Diabetic Retinopathy.  In both eyes.  This started 4 weeks ago.  I, the attending physician,  performed the HPI with the patient and updated documentation appropriately.        Comments   Patient here for 4 weeks retina follow up for NPDR OU. Patient states vision OD good. OS not so good. OS gets dry. Uses drops for dry eyes. OS feels like something in it. Don't hurt.      Last edited by Bernarda Caffey, MD on 12/07/2022  3:45 PM.    Patient states his left eye is "giving me a fit", he states it is very dry even though he is using Systane  Referring physician: [name], MD [place],  [place] [address]  HISTORICAL INFORMATION:   Selected notes from the MEDICAL RECORD NUMBER Referred by Dr. Kathlen Mody for DEE   CURRENT MEDICATIONS: Current Outpatient Medications (Ophthalmic Drugs)  Medication Sig   COMBIGAN 0.2-0.5 % ophthalmic solution Apply 1 drop to eye 2 (two) times daily.   No current facility-administered medications for this visit. (Ophthalmic Drugs)   Current Outpatient Medications (Other)  Medication Sig   albuterol (VENTOLIN HFA) 108 (90 Base) MCG/ACT inhaler Inhale 2 puffs into the lungs every 4 (four) hours as needed for wheezing or shortness of breath.   amiodarone (PACERONE) 200 MG tablet TAKE 1 TABLET BY MOUTH EVERY DAY   Ascorbic Acid (VITAMIN C PO) Take by mouth.   camphor-menthol (ANTI-ITCH) lotion Apply 1 Application topically daily as needed for itching.   carvedilol (COREG) 3.125 MG tablet TAKE 1 TABLET BY MOUTH 2 TIMES DAILY.   clopidogrel (PLAVIX) 75 MG tablet Take 1 tablet (75 mg total) by mouth daily. START 10/29 AFTER TAKING LAST DOSE OF ELIQUIS 2.5 MG ON 10/28    CRANBERRY PO Take 1 tablet by mouth daily.   cyanocobalamin (VITAMIN B12) 1000 MCG tablet Take 1 tablet (1,000 mcg total) by mouth daily.   diphenhydramine-acetaminophen (TYLENOL PM) 25-500 MG TABS tablet Take 2 tablets by mouth at bedtime.   docusate sodium (COLACE) 100 MG capsule Take 1 capsule (100 mg total) by mouth daily. (Patient taking differently: Take 100 mg by mouth 2 (two) times daily.)   ELDERBERRY PO Take by mouth.   ferrous sulfate 325 (65 FE) MG tablet Take 1 tablet (325 mg total) by mouth daily with breakfast.   gemfibrozil (LOPID) 600 MG tablet Take 600 mg by mouth at bedtime.    glimepiride (AMARYL) 2 MG tablet Take 2 mg by mouth at bedtime.   Insulin Glargine (BASAGLAR KWIKPEN) 100 UNIT/ML Inject 32 Units into the skin daily.   KLOR-CON M20 20 MEQ tablet TAKE 1 TABLET BY MOUTH EVERY DAY   lisinopril (ZESTRIL) 20 MG tablet TAKE 1 TABLET BY MOUTH EVERY DAY   Melatonin 10 MG TABS Take 10 mg by mouth at bedtime.   metFORMIN (GLUCOPHAGE-XR) 500 MG 24 hr tablet Take 1,000 mg by mouth in the morning and at bedtime.   mineral oil liquid Take 15 mLs by mouth daily as needed for moderate constipation.   Multiple Vitamins-Minerals (MULTIVITAMIN WITH MINERALS) tablet Take 1 tablet by mouth daily. Centrum men 50+  Omega-3 1000 MG CAPS Take 1,000 mg by mouth daily.   pantoprazole (PROTONIX) 40 MG tablet Take 1 tablet (40 mg total) by mouth daily.   torsemide (DEMADEX) 20 MG tablet Take 1 tablet (20 mg total) by mouth 2 (two) times daily.   triamcinolone cream (KENALOG) 0.1 % Apply topically 2 (two) times daily as needed.   valACYclovir (VALTREX) 1000 MG tablet Take 1,000 mg by mouth at bedtime.   Current Facility-Administered Medications (Other)  Medication Route   0.9 %  sodium chloride infusion Intravenous   0.9 %  sodium chloride infusion Intravenous   REVIEW OF SYSTEMS: ROS   Positive for: Gastrointestinal, Endocrine, Cardiovascular, Eyes, Respiratory Negative for:  Constitutional, Neurological, Skin, Genitourinary, Musculoskeletal, HENT, Psychiatric, Allergic/Imm, Heme/Lymph Last edited by Theodore Demark, COA on 12/07/2022  1:36 PM.      ALLERGIES Allergies  Allergen Reactions   Azithromycin Nausea Only   Tape Rash and Other (See Comments)    Causes skin redness, Use paper tape only.   PAST MEDICAL HISTORY Past Medical History:  Diagnosis Date   Arthritis    Atrial fibrillation (HCC)    CAD (coronary artery disease)    a. Cath 03/17/15 showing 100% ostial D1, 50% prox LAD to mid LAD, 40% RPDA stenosis. Med rx. // Myoview 01/2020: EF 31 diffuse perfusion defect without reversibility (suspect artifact); reviewed with Dr. Aggie Cosier study felt to be low risk   Cataract    Mixed form OD   Chronic diastolic CHF (congestive heart failure) (HCC)    Diabetic retinopathy ([place])    NPDR OU   Essential hypertension    Glaucoma    POAG OU   History of gout    Hyperlipidemia    Hypertensive retinopathy    OU   Kidney stones    Melanoma of neck (HCC)    NICM (nonischemic cardiomyopathy) ([place])    OSA on CPAP 2012   Prostate cancer ([place])    Type II diabetes mellitus ([place])    Past Surgical History:  Procedure Laterality Date   ABDOMINAL HERNIA REPAIR     w/mesh   ATRIAL FIBRILLATION ABLATION N/A 06/06/2021   Procedure: ATRIAL FIBRILLATION ABLATION;  Surgeon: Thompson Grayer, MD;  Location: [facility];  Service: Cardiovascular;  Laterality: N/A;   BIOPSY  05/24/2022   Procedure: BIOPSY;  Surgeon: Daneil Dolin, MD;  [facility];  Service: Endoscopy;;   BIOPSY  05/30/2022   Procedure: BIOPSY;  Surgeon: Daneil Dolin, MD;  [facility];  Service: Endoscopy;;   CARDIAC CATHETERIZATION N/A 03/17/2015   Procedure: Left Heart Cath and Coronary Angiography;  Surgeon: Lorretta Harp, MD;  Location: [facility];  Service: Cardiovascular;  Laterality: N/A;   CARDIOVERSION N/A 08/09/2021   Procedure: CARDIOVERSION;   Surgeon: [name], MD;  Location: [facility];  Service: Cardiovascular;  Laterality: N/A;   CARDIOVERSION N/A 11/10/2021   Procedure: CARDIOVERSION;  Surgeon: Berniece Salines, DO;  Location: [facility];  Service: Cardiovascular;  Laterality: N/A;   carotid doppler  09/17/2008   rigt and left ICAs 0-49%;mildly  abnormal   CATARACT EXTRACTION Left 2020   Dr. Kathlen Mody   COLONOSCOPY N/A 05/16/2017   Procedure: COLONOSCOPY;  Surgeon: Rogene Houston, MD;  [facility];  Service: Endoscopy;  Laterality: N/A;  930   COLONOSCOPY WITH PROPOFOL N/A 05/24/2022   Procedure: COLONOSCOPY WITH PROPOFOL;  Surgeon: Daneil Dolin, MD;  [facility];  Service: Endoscopy;  Laterality: N/A;   DOPPLER ECHOCARDIOGRAPHY  05/25/2009   EF 50-55%,LA mildly dilated, LV function normal   ELECTROPHYSIOLOGIC STUDY N/A 04/05/2015   Procedure: Cardioversion;  Surgeon: Sanda Klein, MD;  Location: [facility];  Service: Cardiovascular;  Laterality: N/A;   ELECTROPHYSIOLOGIC STUDY N/A 09/06/2015   Procedure: Atrial Fibrillation Ablation;  Surgeon: Thompson Grayer, MD;  Location: [facility];  Service: Cardiovascular;  Laterality: N/A;   ELECTROPHYSIOLOGIC STUDY N/A 07/12/2016   redo afib ablation by Dr Rayann Heman   ENTEROSCOPY N/A 05/30/2022   Procedure: ENTEROSCOPY;  Surgeon: Daneil Dolin, MD;  [facility];  Service: Endoscopy;  Laterality: N/A;   ESOPHAGOGASTRODUODENOSCOPY (EGD) WITH PROPOFOL N/A 05/24/2022   Procedure: ESOPHAGOGASTRODUODENOSCOPY (EGD) WITH PROPOFOL;  Surgeon: Daneil Dolin, MD;  [facility];  Service: Endoscopy;  Laterality: N/A;   EXCISIONAL HEMORRHOIDECTOMY     "inside and out"   EYE SURGERY Left 2020   Cat Sx - Dr. Kathlen Mody   FINE NEEDLE ASPIRATION Right    knee; "drew ~ 1 quart off"   GIVENS CAPSULE STUDY N/A 05/25/2022   Procedure: GIVENS CAPSULE STUDY;  Surgeon: Eloise Harman, DO;  [facility];  Service: Endoscopy;   Laterality: N/A;   HERNIA REPAIR     LAPAROSCOPIC CHOLECYSTECTOMY     LEFT ATRIAL APPENDAGE OCCLUSION N/A 06/28/2022   Procedure: LEFT ATRIAL APPENDAGE OCCLUSION;  Surgeon: Sherren Mocha, MD;  Location: [facility];  Service: Cardiovascular;  Laterality: N/A;   MELANOMA EXCISION Right    "neck"   NM MYOCAR PERF WALL MOTION  02/21/2012   EF 61% ,EXERCISE 7 METS. exercise stopped due to wheezing and shortness of breathe   POLYPECTOMY  05/16/2017   Procedure: POLYPECTOMY;  Surgeon: Rogene Houston, MD;  [facility];  Service: Endoscopy;;  colon   POLYPECTOMY  05/24/2022   Procedure: POLYPECTOMY;  Surgeon: Daneil Dolin, MD;  [facility];  Service: Endoscopy;;   PROSTATECTOMY     SHOULDER OPEN ROTATOR CUFF REPAIR Right X 2   SUBMUCOSAL TATTOO INJECTION  05/30/2022   Procedure: SUBMUCOSAL TATTOO INJECTION;  Surgeon: Daneil Dolin, MD;  [facility];  Service: Endoscopy;;   TEE WITHOUT CARDIOVERSION N/A 09/05/2015   Procedure: TRANSESOPHAGEAL ECHOCARDIOGRAM (TEE);  Surgeon: Sueanne Margarita, MD;  Location: [facility];  Service: Cardiovascular;  Laterality: N/A;   TEE WITHOUT CARDIOVERSION N/A 06/28/2022   Procedure: TRANSESOPHAGEAL ECHOCARDIOGRAM (TEE);  Surgeon: Sherren Mocha, MD;  Location: [facility];  Service: Cardiovascular;  Laterality: N/A;   FAMILY HISTORY Family History  Problem Relation Age of Onset   Heart disease Mother    Lung cancer Mother    Heart attack Mother 75   Diabetes Father    Heart disease Father    Stroke Brother    Healthy Daughter    Colon cancer Maternal Aunt        73s   SOCIAL HISTORY Social History   Tobacco Use   Smoking status: Former    Packs/day: 2.00    Years: 27.00    Total pack years: 54.00    Types: Cigarettes    Quit date: 1992    Years since quitting: 32.1    Passive exposure: Current   Smokeless tobacco: Never   Tobacco comments:    Former smoker 09/27/21  Vaping Use   Vaping  Use: Never used  Substance Use Topics   Alcohol use: No  Alcohol/week: 0.0 standard drinks of alcohol    Comment: "used to drink; stopped ~ 2008"   Drug use: No       OPHTHALMIC EXAM: Base Eye Exam     Visual Acuity (Snellen - Linear)       Right Left   Dist cc 20/30 20/100   Dist ph cc 20/25 -1 20/40 -1    Correction: Glasses         Tonometry (Tonopen, 1:33 PM)       Right Left   Pressure 19 15         Pupils       Dark Light Shape React APD   Right 2 1 Round Minimal None   Left 2 1 Round Minimal None         Visual Fields (Counting fingers)       Left Right    Full Full         Extraocular Movement       Right Left    Full, Ortho Full, Ortho         Neuro/Psych     Oriented x3: Yes   Mood/Affect: Normal         Dilation     Both eyes: 1.0% Mydriacyl, 2.5% Phenylephrine @ 1:33 PM           Slit Lamp and Fundus Exam     Slit Lamp Exam       Right Left   Lids/Lashes Dermato, mild MGD Dermato, mild MGD   Conjunctiva/Sclera Temporal pinguecula, mild inferior sub conj heme Temporal pinguecula   Cornea EBMD, mild haze, trace PEE, mild Debris in tear film, well healed cataract wound trace haze, trace PEE, well healed temporal cataract wounds, arcus   Anterior Chamber Deep and clear; narrow temporal angle Deep and clear   Iris Round and dilated, mild anterior bowing, No NVI Round and moderately dilated to 5.48m   Lens PCIOL in good position, trace PCO PC IOL in good position with open PC   Anterior Vitreous Synerisis Synerisis         Fundus Exam       Right Left   Disc Mild pallor, sharp rim, +cupping, thin inferior rim Mild pallor, sharp rim, +cupping, +PPA   C/D Ratio 0.7 0.6   Macula Flat, blunted foveal reflex, cystic changes - persistent, RPE mottling and clumping, no heme, Dr[place], punctate Exudates Flat, blunted foveal reflex, clusters of fine MA nasal and superior to fovea -- improved, +cystic changes -- slightly  improved   Vessels attenuated, Tortuous Attenuated, mild av crossing changes, mild tortuousity   Periphery Attached, rare MA, forcal DBH nasal to disc--improved Attached. No heme.           Refraction     Wearing Rx       Sphere Cylinder Axis Add   Right -0.25 +2.00 158 +2.50   Left -1.00 +1.25 008 +2.50           IMAGING AND PROCEDURES  Imaging and Procedures for 12/07/2022  OCT, Retina - OU - Both Eyes       Right Eye Quality was good. Central Foveal Thickness: 409. Progression has been stable. Findings include no SRF, abnormal foveal contour, intraretinal fluid, vitreomacular adhesion (Persistent IRF/IRHM centrally, stable release of VMA to partial PVD).   Left Eye Quality was good. Central Foveal Thickness: 268. Progression has improved. Findings include no SRF, abnormal foveal contour, intraretinal hyper-reflective material, intraretinal fluid (interval improvement in IRF/cystic changes  centrally; partial PVD).   Notes *Images captured and stored on drive  Diagnosis / Impression:  DME OU OD: Persistent IRF/IRHM centrally, stable release of VMA to partial PVD OS: interval improvement in IRF/cystic changes centrally; partial PVD  Clinical management:  See below  Abbreviations: NFP - Normal foveal profile. CME - cystoid macular edema. PED - pigment epithelial detachment. IRF - intraretinal fluid. SRF - subretinal fluid. EZ - ellipsoid zone. ERM - epiretinal membrane. ORA - outer retinal atrophy. ORT - outer retinal tubulation. SRHM - subretinal hyper-reflective material. IRHM - intraretinal hyper-reflective material     Intravitreal Injection, Pharmacologic Agent - OD - Right Eye       Time Out 12/07/2022. 2:22 PM. Confirmed correct patient, procedure, site, and patient consented.   Anesthesia Topical anesthesia was used. Anesthetic medications included Lidocaine 2%, Proparacaine 0.5%.   Procedure Preparation included 5% betadine to ocular surface, eyelid  speculum. A (32g) needle was used.   Injection: 2 mg aflibercept 2 MG/0.05ML   Route: Intravitreal, Site: Right Eye   NDC: [id number], Lot: [id number], Expiration date: 12/13/2023, Waste: 0 mL   Post-op Post injection exam found visual acuity of at least counting fingers. The patient tolerated the procedure well. There were no complications. The patient received written and verbal post procedure care education.      Intravitreal Injection, Pharmacologic Agent - OS - Left Eye       Time Out 12/07/2022. 2:22 PM. Confirmed correct patient, procedure, site, and patient consented.   Anesthesia Topical anesthesia was used. Anesthetic medications included Lidocaine 2%, Proparacaine 0.5%.   Procedure Preparation included 5% betadine to ocular surface, eyelid speculum. A (32g) needle was used.   Injection: 6 mg faricimab-svoa 6 MG/0.05ML   Route: Intravitreal, Site: Left Eye   NDCBD:[id number], Lot: [id number], Expiration date: 04/13/2024, Waste: 0 mL   Post-op Post injection exam found visual acuity of at least counting fingers. The patient tolerated the procedure well. There were no complications. The patient received written and verbal post procedure care education. Post injection medications were not given.            ASSESSMENT/PLAN:   ICD-10-CM   1. 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, associated with type 2 diabetes mellitus (HCC)  [id number] OCT, Retina - OU - Both Eyes    Intravitreal Injection, Pharmacologic Agent - OD - Right Eye    Intravitreal Injection, Pharmacologic Agent - OS - Left Eye    faricimab-svoa (VABYSMO) 24m/0.05mL intravitreal injection    aflibercept (EYLEA) SOLN 2 mg    2. Essential hypertension  I10     3. Hypertensive retinopathy of both eyes  H35.033     4. Pseudophakia of both eyes  Z96.1     5. 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 History of herpes zoster of eye   Z86.19     7. Primary open angle glaucoma of both eyes, unspecified glaucoma stage  H40.1130      1. 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 Single focal MA superior to fovea; OS: Perifoveal Mas w/ late leakage - s/p IVA OS # 1(07.26.21), #2 (08.24.21), #3 (09.21.21), #4 (10.22.21) -- IVA resistance - s/p IVE OS #1 (11.22.21), #2 (12.21.21), #3 (01.20.22), #4 (02.18.22), #5 (03.18.22), #6 (04.15.22), #7 (05.10.22) -- sample, #8 (06.08.22), #9 (07.12.22), #10 (08.12.22), #11 (09.16.22), #12 (10.21.22), #13 (11.18.22), #14 (12.21.22), #15 (01.20.23), #16 (02.17.23), #17 (03.24.23), #  18 (04.21.23), #19 (05.26.23), #20 (06.23.23) -- IVE resistance - s/p IVV OS #1 (07.21.23), #2 (08.25.23), #3 (09.29.23), #4 (10.30.23), #5 (11.27.23) #6 (12.29.23), #7 (01.26.23) - s/p IVE OD #1 (09.29.23), #2 (10.30.23), #3 (SAMPLE) (11.27.23) #4 (12.29.23) #5 (01.26.24) - BCVA 20/25 -- improved, 20/40 OS - decreased - OCT shows OD: Persistent IRF/IRHM centrally, stable release of VMA to partial PVD; OS: interval improvement in IRF/cystic changes centrally; partial PVD at 4 weeks  - recommend IVE OD #6 and IVV OS #8 today, 02.23.24 with f/u in 4-5 weeks  - pt wishes to proceed with injections  - RBA of procedure discussed, questions answered  - Vabysmo informed consent obtained and signed, 07.21.23 (OS) - IVA informed consent obtained and signed, 07.26.21 (OS) - IVE informed consent obtained and signed, 04.21.23 (OS) - IVE informed consent obtained and signed, 09.29.23 (OD) - Eylea approved until 10/04/23 - [employer] approved Vabysmo - see procedure note - f/u 4-5 weeks -- DFE/OCT, possible injections  2,3. Hypertensive retinopathy OU - discussed importance of tight BP control - continue to monitor  4. Pseudophakia OU  - s/p CE/IOL OS (2020, Dr. Kathlen Mody)             - s/p CE/IOL OD (2022, Dr. Kathlen Mody)  - s/p YAG cap OS (04.04.23) - BCVA 20/30 OD and 20/25 OS from  20/150  - IOLs in good position, doing well  - continue to monitor  5. 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 Susa Simmonds in August 2022  - s/p SuperK OS on 12.12.2022  6. History of herpes zoster/iritis OS  - on valtrex 1 g daily---maintenance  7. POAG OU  - was under the expert management of Dr. Kathlen Mody, now follows with Dr. Lucianne Lei  - IOP 19, 15   - continue Cosopt bid OU  Ophthalmic Meds Ordered this visit:  Meds ordered this encounter  Medications   faricimab-svoa (VABYSMO) 55m/0.05mL intravitreal injection   aflibercept (EYLEA) SOLN 2 mg     This document serves as a record of services personally performed by BGardiner Sleeper MD, PhD. It was created on their behalf by JJoetta MannersCOT, an ophthalmic technician. The creation of this record is the provider's dictation and/or activities during the visit.    Electronically signed by: JJoetta MannersCOT 02.22.24 1:30 AM  This document serves as a record of services personally performed by BGardiner Sleeper MD, PhD. It was created on their behalf by CRenaldo Reel CBlanchesteran ophthalmic technician. The creation of this record is the provider's dictation and/or activities during the visit.    Electronically signed by:  CRenaldo Reel COT  02.23.24 1:30 AM  BGardiner Sleeper M.D., Ph.D. Diseases & Surgery of the Retina and VParcelas Penuel[place]02/23/2024   I have reviewed the above documentation for accuracy and completeness, and I agree with the above. BGardiner Sleeper M.D., Ph.D. 12/08/22 1:34 AM   Abbreviations: M myopia (nearsighted); A astigmatism; H hyperopia (farsighted); P presbyopia; Mrx spectacle prescription;  CTL contact lenses; OD right eye; OS left eye; OU both eyes  XT exotropia; ET esotropia; PEK punctate epithelial keratitis; PEE punctate epithelial erosions; DES dry eye syndrome; MGD meibomian gland dysfunction; ATs artificial tears; PFAT's preservative free artificial tears;  NAlfalfanuclear sclerotic cataract; PSC posterior subcapsular cataract; ERM epi-retinal membrane; PVD posterior vitreous detachment; RD retinal detachment; DM diabetes mellitus; DR diabetic retinopathy; NPDR non-proliferative diabetic retinopathy; PDR proliferative diabetic retinopathy; CSME clinically significant  macular edema; DME diabetic macular edema; dbh dot blot hemorrhages; CWS cotton wool spot; POAG primary open angle glaucoma; C/D cup-to-disc ratio; HVF humphrey visual field; GVF goldmann visual field; OCT optical coherence tomography; IOP intraocular pressure; BRVO Branch retinal vein occlusion; CRVO central retinal vein occlusion; CRAO central retinal artery occlusion; BRAO branch retinal artery occlusion; RT retinal tear; SB scleral buckle; PPV pars plana vitrectomy; VH Vitreous hemorrhage; PRP panretinal laser photocoagulation; IVK intravitreal kenalog; VMT vitreomacular traction; MH Macular hole;  NVD neovascularization of the disc; NVE neovascularization elsewhere; AREDS age related eye disease study; ARMD age related macular degeneration; POAG primary open angle glaucoma; EBMD epithelial/anterior basement membrane dystrophy; ACIOL anterior chamber intraocular lens; IOL intraocular lens; PCIOL posterior chamber intraocular lens; Phaco/IOL phacoemulsification with intraocular lens placement; [place] photorefractive keratectomy; LASIK laser assisted in situ keratomileusis; HTN hypertension; DM diabetes mellitus; COPD chronic obstructive pulmonary disease

## 2022-12-07 ENCOUNTER — Encounter (INDEPENDENT_AMBULATORY_CARE_PROVIDER_SITE_OTHER): Payer: Self-pay | Admitting: Ophthalmology

## 2022-12-07 ENCOUNTER — Ambulatory Visit (INDEPENDENT_AMBULATORY_CARE_PROVIDER_SITE_OTHER): Payer: Medicare Other | Admitting: Ophthalmology

## 2022-12-07 DIAGNOSIS — E113213 Type 2 diabetes mellitus with mild nonproliferative diabetic retinopathy with macular edema, bilateral: Secondary | ICD-10-CM | POA: Diagnosis not present

## 2022-12-07 DIAGNOSIS — I10 Essential (primary) hypertension: Secondary | ICD-10-CM

## 2022-12-07 DIAGNOSIS — H18523 Epithelial (juvenile) corneal dystrophy, bilateral: Secondary | ICD-10-CM

## 2022-12-07 DIAGNOSIS — H35033 Hypertensive retinopathy, bilateral: Secondary | ICD-10-CM

## 2022-12-07 DIAGNOSIS — H401130 Primary open-angle glaucoma, bilateral, stage unspecified: Secondary | ICD-10-CM

## 2022-12-07 DIAGNOSIS — Z961 Presence of intraocular lens: Secondary | ICD-10-CM

## 2022-12-07 DIAGNOSIS — Z8619 Personal history of other infectious and parasitic diseases: Secondary | ICD-10-CM

## 2022-12-07 MED ORDER — FARICIMAB-SVOA 6 MG/0.05ML IZ SOLN
6.0000 mg | INTRAVITREAL | Status: AC | PRN
  Administered 2022-12-07: 6 mg via INTRAVITREAL

## 2022-12-07 MED ORDER — AFLIBERCEPT 2MG/0.05ML IZ SOLN FOR KALEIDOSCOPE
2.0000 mg | INTRAVITREAL | Status: AC | PRN
  Administered 2022-12-07: 2 mg via INTRAVITREAL

## 2022-12-25 ENCOUNTER — Inpatient Hospital Stay: Payer: Medicare Other | Attending: Hematology

## 2022-12-25 DIAGNOSIS — E538 Deficiency of other specified B group vitamins: Secondary | ICD-10-CM

## 2022-12-25 DIAGNOSIS — R768 Other specified abnormal immunological findings in serum: Secondary | ICD-10-CM

## 2022-12-25 DIAGNOSIS — N189 Chronic kidney disease, unspecified: Secondary | ICD-10-CM | POA: Insufficient documentation

## 2022-12-25 DIAGNOSIS — Z9049 Acquired absence of other specified parts of digestive tract: Secondary | ICD-10-CM | POA: Diagnosis not present

## 2022-12-25 DIAGNOSIS — K635 Polyp of colon: Secondary | ICD-10-CM | POA: Insufficient documentation

## 2022-12-25 DIAGNOSIS — K5730 Diverticulosis of large intestine without perforation or abscess without bleeding: Secondary | ICD-10-CM | POA: Diagnosis not present

## 2022-12-25 DIAGNOSIS — K449 Diaphragmatic hernia without obstruction or gangrene: Secondary | ICD-10-CM | POA: Insufficient documentation

## 2022-12-25 DIAGNOSIS — K31819 Angiodysplasia of stomach and duodenum without bleeding: Secondary | ICD-10-CM | POA: Insufficient documentation

## 2022-12-25 DIAGNOSIS — Z7902 Long term (current) use of antithrombotics/antiplatelets: Secondary | ICD-10-CM | POA: Insufficient documentation

## 2022-12-25 DIAGNOSIS — Z808 Family history of malignant neoplasm of other organs or systems: Secondary | ICD-10-CM | POA: Insufficient documentation

## 2022-12-25 DIAGNOSIS — Z79899 Other long term (current) drug therapy: Secondary | ICD-10-CM | POA: Insufficient documentation

## 2022-12-25 DIAGNOSIS — I129 Hypertensive chronic kidney disease with stage 1 through stage 4 chronic kidney disease, or unspecified chronic kidney disease: Secondary | ICD-10-CM | POA: Diagnosis not present

## 2022-12-25 DIAGNOSIS — Z800 Family history of malignant neoplasm of digestive organs: Secondary | ICD-10-CM | POA: Insufficient documentation

## 2022-12-25 DIAGNOSIS — D509 Iron deficiency anemia, unspecified: Secondary | ICD-10-CM | POA: Insufficient documentation

## 2022-12-25 DIAGNOSIS — Z9079 Acquired absence of other genital organ(s): Secondary | ICD-10-CM | POA: Insufficient documentation

## 2022-12-25 DIAGNOSIS — Z8546 Personal history of malignant neoplasm of prostate: Secondary | ICD-10-CM | POA: Insufficient documentation

## 2022-12-25 DIAGNOSIS — D649 Anemia, unspecified: Secondary | ICD-10-CM

## 2022-12-25 DIAGNOSIS — I4891 Unspecified atrial fibrillation: Secondary | ICD-10-CM | POA: Diagnosis not present

## 2022-12-25 DIAGNOSIS — Z87891 Personal history of nicotine dependence: Secondary | ICD-10-CM | POA: Insufficient documentation

## 2022-12-25 DIAGNOSIS — D500 Iron deficiency anemia secondary to blood loss (chronic): Secondary | ICD-10-CM

## 2022-12-25 LAB — CBC WITH DIFFERENTIAL/PLATELET
Abs Immature Granulocytes: 0.02 10*3/uL (ref 0.00–0.07)
Basophils Absolute: 0 10*3/uL (ref 0.0–0.1)
Basophils Relative: 1 %
Eosinophils Absolute: 0.1 10*3/uL (ref 0.0–0.5)
Eosinophils Relative: 2 %
HCT: 32.4 % — ABNORMAL LOW (ref 39.0–52.0)
Hemoglobin: 10.1 g/dL — ABNORMAL LOW (ref 13.0–17.0)
Immature Granulocytes: 0 %
Lymphocytes Relative: 19 %
Lymphs Abs: 1 10*3/uL (ref 0.7–4.0)
MCH: 28.6 pg (ref 26.0–34.0)
MCHC: 31.2 g/dL (ref 30.0–36.0)
MCV: 91.8 fL (ref 80.0–100.0)
Monocytes Absolute: 0.5 10*3/uL (ref 0.1–1.0)
Monocytes Relative: 10 %
Neutro Abs: 3.7 10*3/uL (ref 1.7–7.7)
Neutrophils Relative %: 68 %
Platelets: 276 10*3/uL (ref 150–400)
RBC: 3.53 MIL/uL — ABNORMAL LOW (ref 4.22–5.81)
RDW: 14.2 % (ref 11.5–15.5)
WBC: 5.4 10*3/uL (ref 4.0–10.5)
nRBC: 0 % (ref 0.0–0.2)

## 2022-12-25 LAB — IRON AND TIBC
Iron: 22 ug/dL — ABNORMAL LOW (ref 45–182)
Saturation Ratios: 5 % — ABNORMAL LOW (ref 17.9–39.5)
TIBC: 433 ug/dL (ref 250–450)
UIBC: 411 ug/dL

## 2022-12-25 LAB — VITAMIN B12: Vitamin B-12: 301 pg/mL (ref 180–914)

## 2022-12-25 LAB — FERRITIN: Ferritin: 14 ng/mL — ABNORMAL LOW (ref 24–336)

## 2022-12-25 NOTE — Progress Notes (Deleted)
[facility]                                     Cardiology Office Note:    Date:  12/25/2022   ID:  [name], DOB [birth year], MRN [id number]  PCP:  [name], MD  [facility] Cardiologist:  Rozann Lesches, MD  [facility] Electrophysiologist:  Thompson Grayer, MD (Inactive)   Referring MD: [name], MD   No chief complaint on file. ***  History of Present Illness:    Lucas Dudley is a 82 y.o. male with a hx of persistent atrial fibrillation, hx of GI bleeding, anemia, HTN, OSA on CPAP, and DM2 who was referred by Dr. [name] for the evaluation of Lucas Dudley closure.    Lucas Dudley has a known hx of persistent atrial fibrillation and he has been managed with a rhythm control strategy using amiodarone and anticoagulation with apixaban. He was recently diagnosed with anemia with hemoglobin 7.6 and was referred to the emergency department found to have acute anemia and underwent upper and lower endoscopy studies which showed no significant bleeding source. 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 He required multiple PRBC transfusions and his apixaban was held throughout his hospitalization. He improved clinically with diuresis and his hemoglobin ultimately stabilized. At discharge, his anticoagulation was held. He followed up in the clinic with Dr. [name] who then referred him for consideration of left atrial appendage closure as an alternative to anticoagulation in the context of high recurrent bleeding risk. He was seen by Dr. [name] 06/13/22 and felt to be a good candidate for Watchman implant.    He underwent CT imaging which showed anatomy suitable for implant and he was therefore scheduled 06/28/22 for his procedure. He is now s/p left atrial appendage occlusive device placement with a 89m Watchman FLX device. Plan was for Eliquis 2.'5mg'$  BID at discharge and then transition to Plavix monotherapy at 6 weeks for a  total of 6 months (10/28) out from procedure date.    He is here today with his daughter. He has been feeling well from a CV standpoint although states that he was having a lot of GI symptoms while taking Ozempic and is no longer on this. He feels that he is getting back to his self. He did have one incident with chest pain several weeks ago however feels this was before he stopped Ozempic. Symptoms have not recurred. He denies SOB, palpitations, LE edema, orthopnea, dizziness, syncope, or bleeding in stool or urine.     Persistent atrial fibrillation: s/p LAAO closure with a 374mWatchman FLX device. He was restarted on low-dose apixaban 2.5 mg twice daily 5 days before the procedure and has tolerated this well. He will stop Eliquis 10/28 and start Plavix '75mg'$  QD 10/29 for a duration of 6 months (12/27/22). No SBE needed as he has full upper and lower dentures. Plan post op CT 11/13. Instructions reviewed with understanding. Obtain CMET, CBC. He was seen by PCP yesterday with plans for labwork. Will also obtain Hb A1c.I will send labs to PCP. Plan to follow with our team 12/2022. This has been scheduled.   HTN: Elevated above baseline today. No changes. Continue to monitor   .  Persistent atrial fibrillation with CHA2DS2-VASc score of 5.  He is status post Watchman device implantation by Dr. CoBurt Knacknd is nearing end  of 42-monthtreatment course on Plavix (to stop on March 14 per structural heart clinic note).  He does not report any substantial palpitations on amiodarone otherwise.  I reviewed his most recent lab work.  Last TSH and LFTs were normal.   2.  History of recurrent GI bleeding with prior documented multiple small bowel AVMs.  He continues to follow with gastroenterology.   3.  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  Recent reported fleeting thoracic discomfort does not sound clearly anginal based on description, more  likely GI related.  Continue Coreg and Lopid.   4.  OSA on CPAP, referring back to the sleep medicine clinic given reported daytime somnolence.  May need follow-up testing or adjustment in therapy.   Past Medical History:  Diagnosis Date   Arthritis    Atrial fibrillation (HCC)    CAD (coronary artery disease)    a. Cath 03/17/15 showing 100% ostial D1, 50% prox LAD to mid LAD, 40% RPDA stenosis. Med rx. // Myoview 01/2020: EF 31 diffuse perfusion defect without reversibility (suspect artifact); reviewed with Dr. NAggie Cosierstudy felt to be low risk   Cataract    Mixed form OD   Chronic diastolic CHF (congestive heart failure) (HCC)    Diabetic retinopathy ([place]    NPDR OU   Essential hypertension    Glaucoma    POAG OU   History of gout    Hyperlipidemia    Hypertensive retinopathy    OU   Kidney stones    Melanoma of neck (HCC)    [place]IC[place]mic [place]) [place]    OSA on CPAP 2012   [place] cancer [place]    Type II diabetes mellitus [place]     Past Surgical History:  Procedure Laterality Date   ABDOMINAL HERNIA REPAIR     w/mesh   ATRIAL FIBRILLATION ABLATION N/A 06/06/2021   Procedure: ATRIAL FIBRILLATION ABLATION;  Surgeon: AThompson Grayer MD;  [place];  Service: Cardiovascular;  Laterality: N/A;   BIOPSY  05/24/2022   Procedure: BIOPSY;  Surgeon: RDaneil Dolin MD;  [facility];  Service: Endoscopy;;   BIOPSY  05/30/2022   Procedure: BIOPSY;  Surgeon: RDaneil Dolin MD;  [facility];  Service: Endoscopy;;   CARDIAC CATHETERIZATION N/A 03/17/2015   Procedure: Left Heart Cath and Coronary Angiography;  Surgeon: JLorretta Harp MD;  [place];  Service: Cardiovascular;  Laterality: N/A;   CARDIOVERSION N/A 08/09/2021   Procedure: CARDIOVERSION;  Surgeon: RSkeet Latch MD;  [place]  Service: Cardiovascular;  Laterality: N/A;   CARDIOVERSION N/A 11/10/2021   Procedure:  CARDIOVERSION;  Surgeon: TBerniece Salines DO;  [place]  Service: Cardiovascular;  Laterality: N/A;   carotid doppler  09/17/2008   rigt and left ICAs 0-49%;mildly  abnormal   CATARACT EXTRACTION Left 2020   Dr. WKathlen Mody  COLONOSCOPY N/A 05/16/2017   Procedure: COLONOSCOPY;  Surgeon: RRogene Houston MD;  [facility];  Service: Endoscopy;  Laterality: N/A;  930   COLONOSCOPY WITH PROPOFOL N/A 05/24/2022   Procedure: COLONOSCOPY WITH PROPOFOL;  Surgeon: RDaneil Dolin MD;  [facility];  Service: Endoscopy;  Laterality: N/A;   DOPPLER ECHOCARDIOGRAPHY  05/25/2009   EF 50-55%,LA mildly dilated, LV function normal   ELECTROPHYSIOLOGIC STUDY N/A 04/05/2015   Procedure: Cardioversion;  Surgeon: MSanda Klein MD;  [facility];  Service: Cardiovascular;  Laterality: N/A;   ELECTROPHYSIOLOGIC STUDY N/A 09/06/2015   Procedure: Atrial Fibrillation Ablation;  Surgeon: Thompson Grayer, MD;  Location: [place] [facility];  Service: Cardiovascular;  Laterality: N/A;   ELECTROPHYSIOLOGIC STUDY N/A 07/12/2016   redo afib ablation by Dr Rayann Heman   ENTEROSCOPY N/A 05/30/2022   Procedure: ENTEROSCOPY;  Surgeon: Daneil Dolin, MD;  [facility];  Service: Endoscopy;  Laterality: N/A;   ESOPHAGOGASTRODUODENOSCOPY (EGD) WITH PROPOFOL N/A 05/24/2022   Procedure: ESOPHAGOGASTRODUODENOSCOPY (EGD) WITH PROPOFOL;  Surgeon: Daneil Dolin, MD;  [facility];  Service: Endoscopy;  Laterality: N/A;   EXCISIONAL HEMORRHOIDECTOMY     "inside and out"   EYE SURGERY Left 2020   Cat Sx - Dr. Kathlen Mody   FINE NEEDLE ASPIRATION Right    knee; "drew ~ 1 quart off"   GIVENS CAPSULE STUDY N/A 05/25/2022   Procedure: GIVENS CAPSULE STUDY;  Surgeon: Eloise Harman, DO;  [facility];  Service: Endoscopy;  Laterality: N/A;   HERNIA REPAIR     LAPAROSCOPIC CHOLECYSTECTOMY     LEFT ATRIAL APPENDAGE OCCLUSION N/A 06/28/2022   Procedure: LEFT ATRIAL  APPENDAGE OCCLUSION;  Surgeon: Sherren Mocha, MD;  Location: [place] [facility];  Service: Cardiovascular;  Laterality: N/A;   MELANOMA EXCISION Right    "neck"   NM MYOCAR PERF WALL MOTION  02/21/2012   EF 61% ,EXERCISE 7 METS. exercise stopped due to wheezing and shortness of breathe   POLYPECTOMY  05/16/2017   Procedure: POLYPECTOMY;  Surgeon: Rogene Houston, MD;  [facility];  Service: Endoscopy;;  colon   POLYPECTOMY  05/24/2022   Procedure: POLYPECTOMY;  Surgeon: Daneil Dolin, MD;  [facility];  Service: Endoscopy;;   PROSTATECTOMY     SHOULDER OPEN ROTATOR CUFF REPAIR Right X 2   SUBMUCOSAL TATTOO INJECTION  05/30/2022   Procedure: SUBMUCOSAL TATTOO INJECTION;  Surgeon: Daneil Dolin, MD;  [facility];  Service: Endoscopy;;   TEE WITHOUT CARDIOVERSION N/A 09/05/2015   Procedure: TRANSESOPHAGEAL ECHOCARDIOGRAM (TEE);  Surgeon: Sueanne Margarita, MD;  [facility];  Service: Cardiovascular;  Laterality: N/A;   TEE WITHOUT CARDIOVERSION N/A 06/28/2022   Procedure: TRANSESOPHAGEAL ECHOCARDIOGRAM (TEE);  Surgeon: Sherren Mocha, MD;  Location: [place] [facility];  Service: Cardiovascular;  Laterality: N/A;    Current Medications: No outpatient medications have been marked as taking for the 12/26/22 encounter (Appointment) with [facility].   Current Facility-Administered Medications for the 12/26/22 encounter (Appointment) with [facility]  Medication   0.9 %  sodium chloride infusion   0.9 %  sodium chloride infusion     Allergies:   Azithromycin and Tape   Social History   Socioeconomic History   Marital status: Widowed    Spouse name: Not on file   Number of children: 1   Years of education: Not on file   Highest education level: Not on file  Occupational History   Occupation: Retired  Tobacco Use   Smoking status: Former    Packs/day: 2.00    Years: 27.00    Total pack years: 54.00     Types: Cigarettes    Quit date: 1992    Years since quitting: 32.2    Passive exposure: Current   Smokeless tobacco: Never   Tobacco comments:    Former smoker 09/27/21  Vaping Use   Vaping Use: Never used  Substance and Sexual Activity  Alcohol use: No    Alcohol/week: 0.0 standard drinks of alcohol    Comment: "used to drink; stopped ~ 2008"   Drug use: No   Sexual activity: Not Currently  Other Topics Concern   Not on file  Social History Narrative   Lives in [place], [place] with wife.   Social Determinants of Health   Financial Resource Strain: Not on file  Food Insecurity: No Food Insecurity (09/11/2022)   Hunger Vital Sign    Worried About Running Out of Food in the Last Year: Never true    Ran Out of Food in the Last Year: Never true  Transportation Needs: No Transportation Needs (09/11/2022)   PRAPARE - Hydrologist (Medical): No    Lack of Transportation (Non-Medical): No  Physical Activity: Not on file  Stress: Not on file  Social Connections: Not on file     Family History: The patient's ***family history includes Colon cancer in his maternal aunt; Diabetes in his father; Healthy in his daughter; Heart attack (age of onset: 33) in his mother; Heart disease in his father and mother; Lung cancer in his mother; Stroke in his brother.  ROS:   Please see the history of present illness.    All other systems reviewed and are negative.  EKGs/Labs/Other Studies Reviewed:    The following studies were reviewed today:  Post LAAO CT 08/29/22:   IMPRESSION: 1.  Mild bi atrial enlargement   2.  Trans septal puncture site sealed with no shunt   3. 31 mm Watchman FLX with 1/3 medial shoulder and 2.2 mm medial gap. Device is well endothelialized at surface. Average compression 15% Distal 1/3 of device and LAA not thrombosed and accepts contrast   4.  No pericardial effusion   5.  Normal ascending thoracic aorta 3.4 cm   6.  Normal  PV anatomy   LAAO closure 06/28/22:   Procedures This Admission:  Transeptal Puncture Intra-procedural TEE which showed no LAA thrombus Left atrial appendage occlusive device placement on 06/28/22 by Dr. [name].     EKG:  EKG is *** ordered today.  The ekg ordered today demonstrates ***  Recent Labs: 05/21/2022: B Natriuretic Peptide 166.2; TSH 3.037 06/01/2022: Magnesium 1.6 08/01/2022: ALT 11; BUN 22; Creatinine, Ser 1.32; Potassium 5.2; Sodium 142 09/11/2022: Hemoglobin 11.9; Platelets 254  Recent Lipid Panel No results found for: "CHOL", "TRIG", "HDL", "CHOLHDL", "VLDL", "LDLCALC", "LDLDIRECT"   Risk Assessment/Calculations:   {Does this patient have ATRIAL FIBRILLATION?[phone]}   CHA2DS2-VASc Score = 5 [CHF History: 0, HTN History: 1, Diabetes History: 1, Stroke History: 0, Vascular Disease History: 1, Age Score: 2, Gender Score: 0].  Therefore, the patient's annual risk of stroke is 7.2 %.        HAS-BLED score *** Hypertension (Uncontrolled in 30 days)  {YES/NO:21197} Abnormal renal and liver function (Dialysis, transplant, Cr >2.26 mg/dL /Cirrhosis or Bilirubin >2x Normal or AST/ALT/AP >3x Normal) {YES/NO:21197} Stroke {YES/NO:21197} Bleeding {YES/NO:21197} Labile INR (Unstable/high INR) {YES/NO:21197} Elderly (>65) {YES/NO:21197} Drugs or alcohol (? 8 drinks/week, anti-plt or NSAID) {YES/NO:21197}   Physical Exam:    VS:  There were no vitals taken for this visit.    Wt Readings from Last 3 Encounters:  11/27/22 236 lb 9.6 oz (107.3 kg)  11/21/22 235 lb (106.6 kg)  09/25/22 236 lb (107 kg)     GEN: *** Well nourished, well developed in no acute distress HEENT: Normal NECK: No JVD; No carotid bruits LYMPHATICS: No lymphadenopathy CARDIAC: ***  RRR, no murmurs, rubs, gallops RESPIRATORY:  Clear to auscultation without rales, wheezing or rhonchi  ABDOMEN: Soft, non-tender, non-distended MUSCULOSKELETAL:  No edema; No deformity  SKIN: Warm and  dry NEUROLOGIC:  Alert and oriented x 3 PSYCHIATRIC:  Normal affect   ASSESSMENT:    No diagnosis found. PLAN:    In order of problems listed above:       {Are you ordering a CV Procedure (e.g. stress test, cath, DCCV, TEE, etc)?   Press F2        :[id number]    Medication Adjustments/Labs and Tests Ordered: Current medicines are reviewed at length with the patient today.  Concerns regarding medicines are outlined above.  No orders of the defined types were placed in this encounter.  No orders of the defined types were placed in this encounter.   There are no Patient Instructions on file for this visit.   Signed, Kathyrn Drown, NP  12/25/2022 7:46 AM    [place]

## 2022-12-26 ENCOUNTER — Telehealth: Payer: Self-pay

## 2022-12-26 ENCOUNTER — Ambulatory Visit: Payer: Medicare Other

## 2022-12-26 NOTE — Telephone Encounter (Signed)
I spoke with the patient and made him aware that we can cancel his appointment today with the structural heart team. I discussed the needed medication changes and the pt has already taken his plavix today.  I advised him that he can now stop plavix and beginning tomorrow he should start ASA '81mg'$  daily.  The pt states that he has been having issues with his stomach and has an upcoming EGD on 3/28.  The pt said he cannot do enteric coated ASA and plans to take an '81mg'$  chewable ASA daily with food.  I will update his medication list.

## 2022-12-26 NOTE — Telephone Encounter (Signed)
The patient is scheduled for 6 mo LAAO visit today.  He has had several appointments lately (including one with Cardiology). Called to cancel appointment and to tell him to STOP PLAVIX and START ASA 81 mg daily (per Dr. Antionette Char 09/13/22 phone note).  Left message to call back.

## 2022-12-29 LAB — METHYLMALONIC ACID, SERUM: Methylmalonic Acid, Quantitative: 220 nmol/L (ref 0–378)

## 2022-12-31 ENCOUNTER — Encounter: Payer: Self-pay | Admitting: Hematology

## 2022-12-31 ENCOUNTER — Inpatient Hospital Stay: Payer: Medicare Other | Admitting: Hematology

## 2022-12-31 VITALS — BP 160/65 | HR 67 | Temp 97.1°F | Resp 18 | Wt 237.3 lb

## 2022-12-31 DIAGNOSIS — D500 Iron deficiency anemia secondary to blood loss (chronic): Secondary | ICD-10-CM

## 2022-12-31 DIAGNOSIS — D509 Iron deficiency anemia, unspecified: Secondary | ICD-10-CM | POA: Diagnosis not present

## 2022-12-31 LAB — IMMUNOFIXATION ELECTROPHORESIS
IgA: 258 mg/dL (ref 61–437)
IgG (Immunoglobin G), Serum: 1179 mg/dL (ref 603–1613)
IgM (Immunoglobulin M), Srm: 69 mg/dL (ref 15–143)
Total Protein ELP: 7 g/dL (ref 6.0–8.5)

## 2022-12-31 NOTE — Progress Notes (Signed)
[facility] [address] [place] [address]    Clinic Day:  12/31/2022  Referring physician: [name], MD  Patient Care Team: [name], MD as PCP - General (Family Medicine) Thompson Grayer, MD (Inactive) as PCP - Electrophysiology (Cardiology) Satira Sark, MD as PCP - Cardiology (Cardiology)   ASSESSMENT & PLAN:   Assessment: 1.  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  Small bowel nodule-junction distal duodenum/jejunum.  Irregular with a submucosal component, suspicious in appearance.  Pathology was benign. - Colonoscopy (05/24/2022): 5 mm polyp in the sigmoid colon, diverticulosis in the descending colon and at the splenic flexure.  Pathology-hyperplastic polyp. - EGD (05/24/2022): Normal esophagus, small hiatal hernia, friable gastric mucosa, couple of tiny gastric erosions, normal duodenal bulb and second part of duodenum. - He was on Eliquis for about 4 years until September 2023 when he had Watchman procedure done.  Since then he is on Plavix daily. - He reports 1 episode of tarry stool in October 2023. - He is taking iron tablet twice daily with vitamin C since his discharge from the hospital in August 2023.   2.  Social/family history: - Lives at home by himself.  He is seen today with his daughter [name].  He is independent of ADLs and IADLs.  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  He has exposure to torrent chemicals.  He smoked 1 pack/day for 35 years and quit smoking in 1992. - No family history of significant anemia. - Mother had mesothelioma.  Maternal aunt had colon cancer.  Maternal uncle had adrenal gland cancer.  Plan: 1.  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 - He reports a decrease in energy levels. - Plavix was stopped on  12/26/2022.  He is taking baby aspirin daily. - He has a endoscopy scheduled end of this month for follow-up of irregular small bowel nodule. - We reviewed labs from 12/25/2022 which showed ferritin of 14.  Percent saturation is 5.  Hemoglobin dropped to 10.1 from 11.9 previously. - Serum immunofixation was normal.  Free light chain ratio is mildly elevated commensurate with CKD. - I have recommended parenteral iron therapy with Feraheme weekly x 2. - We discussed side effects of Feraheme in detail. - Continue vitamin B12 daily.  Vitamin B12 level was normal. - RTC 3 months for follow-up with repeat CBC, ferritin, iron panel, B12.  Orders Placed This Encounter  Procedures   CBC with Differential/Platelet    Standing Status:   Future    Standing Expiration Date:   12/31/2023    Order Specific Question:   Release to patient    Answer:   Immediate   Ferritin    Standing Status:   Future    Standing Expiration Date:   12/31/2023    Order Specific Question:   Release to patient    Answer:   Immediate   Iron and TIBC    Standing Status:   Future    Standing Expiration Date:   12/31/2023    Order Specific Question:   Release to patient    Answer:   Immediate   Vitamin B12    Standing Status:   Future    Standing Expiration Date:   12/31/2023    Order Specific Question:   Release to patient  Answer:   Immediate   Immunofixation electrophoresis    Standing Status:   Future    Standing Expiration Date:   12/31/2023    Order Specific Question:   Release to patient    Answer:   Immediate    I,Alexis Herring,acting as a scribe for Derek Jack, MD.,have documented all relevant documentation on the behalf of Derek Jack, MD,as directed by  Derek Jack, MD while in the presence of Derek Jack, MD.   I, Derek Jack MD, have reviewed the above documentation for accuracy and completeness, and I agree with the above.   Derek Jack, MD    3/18/20245:01 PM  CHIEF COMPLAINT:   Diagnosis: Normocytic anemia    Cancer Staging  No matching staging information was found for the patient.   Prior Therapy: Oral iron and B12 supplements  Current Therapy: Feraheme x 2   HISTORY OF PRESENT ILLNESS:   Oncology History   No history exists.     INTERVAL HISTORY:   Lucas Dudley is a 82 y.o. male presenting to clinic today for follow up of Normocytic anemia. He was last seen by me on 09/11/22 for consult.  Today, he states that he is doing well overall. His appetite level is at 100%. His energy level is at 0%. He denies any bleeding per rectum or melena.   PAST MEDICAL HISTORY:   Past Medical History: Past Medical History:  Diagnosis Date   Arthritis    Atrial fibrillation (HCC)    CAD (coronary artery disease)    a. Cath 03/17/15 showing 100% ostial D1, 50% prox LAD to mid LAD, 40% RPDA stenosis. Med rx. // Myoview 01/2020: EF 31 diffuse perfusion defect without reversibility (suspect artifact); reviewed with Dr. Aggie Cosier study felt to be low risk   Cataract    Mixed form OD   Chronic diastolic CHF (congestive heart failure) (HCC)    Diabetic retinopathy ([place])    NPDR OU   Essential hypertension    Glaucoma    POAG OU   History of gout    Hyperlipidemia    Hypertensive retinopathy    OU   Kidney stones    Melanoma of neck (HCC)    NICM (nonischemic cardiomyopathy) ([facility] [place])    OSA on CPAP 2012   Prostate cancer ([facility])    Type II diabetes mellitus ([facility])     Surgical History: Past Surgical History:  Procedure Laterality Date   ABDOMINAL HERNIA REPAIR     w/mesh   ATRIAL FIBRILLATION ABLATION N/A 06/06/2021   Procedure: ATRIAL FIBRILLATION ABLATION;  Surgeon: Thompson Grayer, MD;  Location: [facility];  Service: Cardiovascular;  Laterality: N/A;   BIOPSY  05/24/2022   Procedure: BIOPSY;  Surgeon: Daneil Dolin, MD;  [facility];  Service: Endoscopy;;   BIOPSY  05/30/2022   Procedure: BIOPSY;   Surgeon: Daneil Dolin, MD;  [facility];  Service: Endoscopy;;   CARDIAC CATHETERIZATION N/A 03/17/2015   Procedure: Left Heart Cath and Coronary Angiography;  Surgeon: Lorretta Harp, MD;  Location: [facility];  Service: Cardiovascular;  Laterality: N/A;   CARDIOVERSION N/A 08/09/2021   Procedure: CARDIOVERSION;  Surgeon: [name], MD;  Location: [facility];  Service: Cardiovascular;  Laterality: N/A;   CARDIOVERSION N/A 11/10/2021   Procedure: CARDIOVERSION;  Surgeon: Berniece Salines, DO;  Location: [facility];  Service: Cardiovascular;  Laterality: N/A;   carotid doppler  09/17/2008   rigt and left ICAs 0-49%;mildly  abnormal   CATARACT  EXTRACTION Left 2020   Dr. Kathlen Mody   COLONOSCOPY N/A 05/16/2017   Procedure: COLONOSCOPY;  Surgeon: Rogene Houston, MD;  [facility];  Service: Endoscopy;  Laterality: N/A;  930   COLONOSCOPY WITH PROPOFOL N/A 05/24/2022   Procedure: COLONOSCOPY WITH PROPOFOL;  Surgeon: Daneil Dolin, MD;  [facility];  Service: Endoscopy;  Laterality: N/A;   DOPPLER ECHOCARDIOGRAPHY  05/25/2009   EF 50-55%,LA mildly dilated, LV function normal   ELECTROPHYSIOLOGIC STUDY N/A 04/05/2015   Procedure: Cardioversion;  Surgeon: Sanda Klein, MD;  Location: [facility];  Service: Cardiovascular;  Laterality: N/A;   ELECTROPHYSIOLOGIC STUDY N/A 09/06/2015   Procedure: Atrial Fibrillation Ablation;  Surgeon: Thompson Grayer, MD;  Location: [facility];  Service: Cardiovascular;  Laterality: N/A;   ELECTROPHYSIOLOGIC STUDY N/A 07/12/2016   redo afib ablation by Dr Rayann Heman   ENTEROSCOPY N/A 05/30/2022   Procedure: ENTEROSCOPY;  Surgeon: Daneil Dolin, MD;  [facility];  Service: Endoscopy;  Laterality: N/A;   ESOPHAGOGASTRODUODENOSCOPY (EGD) WITH PROPOFOL N/A 05/24/2022   Procedure: ESOPHAGOGASTRODUODENOSCOPY (EGD) WITH PROPOFOL;  Surgeon: Daneil Dolin, MD;  [facility];  Service:  Endoscopy;  Laterality: N/A;   EXCISIONAL HEMORRHOIDECTOMY     "inside and out"   EYE SURGERY Left 2020   Cat Sx - Dr. Kathlen Mody   FINE NEEDLE ASPIRATION Right    knee; "drew ~ 1 quart off"   GIVENS CAPSULE STUDY N/A 05/25/2022   Procedure: GIVENS CAPSULE STUDY;  Surgeon: Eloise Harman, DO;  [facility];  Service: Endoscopy;  Laterality: N/A;   HERNIA REPAIR     LAPAROSCOPIC CHOLECYSTECTOMY     LEFT ATRIAL APPENDAGE OCCLUSION N/A 06/28/2022   Procedure: LEFT ATRIAL APPENDAGE OCCLUSION;  Surgeon: Sherren Mocha, MD;  Location: [facility];  Service: Cardiovascular;  Laterality: N/A;   MELANOMA EXCISION Right    "neck"   NM MYOCAR PERF WALL MOTION  02/21/2012   EF 61% ,EXERCISE 7 METS. exercise stopped due to wheezing and shortness of breathe   POLYPECTOMY  05/16/2017   Procedure: POLYPECTOMY;  Surgeon: Rogene Houston, MD;  [facility];  Service: Endoscopy;;  colon   POLYPECTOMY  05/24/2022   Procedure: POLYPECTOMY;  Surgeon: Daneil Dolin, MD;  [facility];  Service: Endoscopy;;   PROSTATECTOMY     SHOULDER OPEN ROTATOR CUFF REPAIR Right X 2   SUBMUCOSAL TATTOO INJECTION  05/30/2022   Procedure: SUBMUCOSAL TATTOO INJECTION;  Surgeon: Daneil Dolin, MD;  [facility];  Service: Endoscopy;;   TEE WITHOUT CARDIOVERSION N/A 09/05/2015   Procedure: TRANSESOPHAGEAL ECHOCARDIOGRAM (TEE);  Surgeon: Sueanne Margarita, MD;  Location: [facility];  Service: Cardiovascular;  Laterality: N/A;   TEE WITHOUT CARDIOVERSION N/A 06/28/2022   Procedure: TRANSESOPHAGEAL ECHOCARDIOGRAM (TEE);  Surgeon: Sherren Mocha, MD;  Location: [facility];  Service: Cardiovascular;  Laterality: N/A;    Social History: Social History   Socioeconomic History   Marital status: Widowed    Spouse name: Not on file   Number of children: 1   Years of education: Not on file   Highest education level: Not on file  Occupational History   Occupation: Retired   Tobacco Use   Smoking status: Former    Packs/day: 2.00    Years: 27.00    Additional pack years: 0.00    Total pack years: 54.00    Types: Cigarettes    Quit  date: 23    Years since quitting: 32.2    Passive exposure: Current   Smokeless tobacco: Never   Tobacco comments:    Former smoker 09/27/21  Vaping Use   Vaping Use: Never used  Substance and Sexual Activity   Alcohol use: No    Alcohol/week: 0.0 standard drinks of alcohol    Comment: "used to drink; stopped ~ 2008"   Drug use: No   Sexual activity: Not Currently  Other Topics Concern   Not on file  Social History Narrative   Lives in [place], [place] with wife.   Social Determinants of Health   Financial Resource Strain: Not on file  Food Insecurity: No Food Insecurity (09/11/2022)   Hunger Vital Sign    Worried About Running Out of Food in the Last Year: Never true    Ran Out of Food in the Last Year: Never true  Transportation Needs: No Transportation Needs (09/11/2022)   PRAPARE - Hydrologist (Medical): No    Lack of Transportation (Non-Medical): No  Physical Activity: Not on file  Stress: Not on file  Social Connections: Not on file  Intimate Partner Violence: Not At Risk (09/11/2022)   Humiliation, Afraid, Rape, and Kick questionnaire    Fear of Current or Ex-Partner: No    Emotionally Abused: No    Physically Abused: No    Sexually Abused: No    Family History: Family History  Problem Relation Age of Onset   Heart disease Mother    Lung cancer Mother    Heart attack Mother 40   Diabetes Father    Heart disease Father    Stroke Brother    Healthy Daughter    Colon cancer Maternal Aunt        70s    Current Medications:  Current Outpatient Medications:    albuterol (VENTOLIN HFA) 108 (90 Base) MCG/ACT inhaler, Inhale 2 puffs into the lungs every 4 (four) hours as needed for wheezing or shortness of breath., Disp: , Rfl:    amiodarone (PACERONE) 200 MG tablet,  TAKE 1 TABLET BY MOUTH EVERY DAY, Disp: 90 tablet, Rfl: 1   Ascorbic Acid (VITAMIN C PO), Take by mouth., Disp: , Rfl:    aspirin 81 MG chewable tablet, Chew 81 mg by mouth daily., Disp: , Rfl:    camphor-menthol (ANTI-ITCH) lotion, Apply 1 Application topically daily as needed for itching., Disp: , Rfl:    carvedilol (COREG) 3.125 MG tablet, TAKE 1 TABLET BY MOUTH 2 TIMES DAILY., Disp: 180 tablet, Rfl: 3   COMBIGAN 0.2-0.5 % ophthalmic solution, Apply 1 drop to eye 2 (two) times daily., Disp: , Rfl:    CRANBERRY PO, Take 1 tablet by mouth daily., Disp: , Rfl:    cyanocobalamin (VITAMIN B12) 1000 MCG tablet, Take 1 tablet (1,000 mcg total) by mouth daily., Disp: , Rfl:    diphenhydramine-acetaminophen (TYLENOL PM) 25-500 MG TABS tablet, Take 2 tablets by mouth at bedtime., Disp: , Rfl:    docusate sodium (COLACE) 100 MG capsule, Take 1 capsule (100 mg total) by mouth daily. (Patient taking differently: Take 100 mg by mouth 2 (two) times daily.), Disp: 60 capsule, Rfl: 2   ELDERBERRY PO, Take by mouth., Disp: , Rfl:    ferrous sulfate 325 (65 FE) MG tablet, Take 1 tablet (325 mg total) by mouth daily with breakfast., Disp: 120 tablet, Rfl: 1   gemfibrozil (LOPID) 600 MG tablet, Take 600 mg by mouth at bedtime. , Disp: ,  Rfl:    glimepiride (AMARYL) 2 MG tablet, Take 2 mg by mouth at bedtime., Disp: , Rfl:    Insulin Glargine (BASAGLAR KWIKPEN) 100 UNIT/ML, Inject 32 Units into the skin daily., Disp: , Rfl:    KLOR-CON M20 20 MEQ tablet, TAKE 1 TABLET BY MOUTH EVERY DAY, Disp: 90 tablet, Rfl: 1   lisinopril (ZESTRIL) 20 MG tablet, TAKE 1 TABLET BY MOUTH EVERY DAY, Disp: 90 tablet, Rfl: 1   Melatonin 10 MG TABS, Take 10 mg by mouth at bedtime., Disp: , Rfl:    metFORMIN (GLUCOPHAGE-XR) 500 MG 24 hr tablet, Take 1,000 mg by mouth in the morning and at bedtime., Disp: , Rfl:    mineral oil liquid, Take 15 mLs by mouth daily as needed for moderate constipation., Disp: , Rfl:    Multiple  Vitamins-Minerals (MULTIVITAMIN WITH MINERALS) tablet, Take 1 tablet by mouth daily. Centrum men 50+, Disp: , Rfl:    pantoprazole (PROTONIX) 40 MG tablet, Take 1 tablet (40 mg total) by mouth daily., Disp: 90 tablet, Rfl: 3   torsemide (DEMADEX) 20 MG tablet, Take 1 tablet (20 mg total) by mouth 2 (two) times daily., Disp: 180 tablet, Rfl: 1   triamcinolone cream (KENALOG) 0.1 %, Apply topically 2 (two) times daily as needed., Disp: , Rfl:    valACYclovir (VALTREX) 1000 MG tablet, Take 1,000 mg by mouth at bedtime., Disp: , Rfl:    Omega-3 1000 MG CAPS, Take 1,000 mg by mouth daily., Disp: , Rfl:   Current Facility-Administered Medications:    0.9 %  sodium chloride infusion, , Intravenous, Continuous, Sherren Mocha, MD   0.9 %  sodium chloride infusion, , Intravenous, Continuous, Sherren Mocha, MD   Allergies: Allergies  Allergen Reactions   Azithromycin Nausea Only   Tape Rash and Other (See Comments)    Causes skin redness, Use paper tape only.    REVIEW OF SYSTEMS:   Review of Systems  Constitutional:  Negative for chills, fatigue and fever.  HENT:   Negative for lump/mass, mouth sores, nosebleeds, sore throat and trouble swallowing.   Eyes:  Negative for eye problems.  Respiratory:  Negative for cough and shortness of breath.   Cardiovascular:  Negative for chest pain, leg swelling and palpitations.  Gastrointestinal:  Positive for constipation. Negative for abdominal pain, diarrhea, nausea and vomiting.  Genitourinary:  Negative for bladder incontinence, difficulty urinating, dysuria, frequency, hematuria and nocturia.   Musculoskeletal:  Negative for arthralgias, back pain, flank pain, myalgias and neck pain.  Skin:  Negative for itching and rash.  Neurological:  Negative for dizziness, headaches and numbness.  Hematological:  Does not bruise/bleed easily.  Psychiatric/Behavioral:  Positive for sleep disturbance. Negative for depression and suicidal ideas. The patient is  not nervous/anxious.   All other systems reviewed and are negative.    VITALS:   Blood pressure (!) 160/65, pulse 67, temperature (!) 97.1 F (36.2 C), temperature source Oral, resp. rate 18, weight 237 lb 4.8 oz (107.6 kg), SpO2 96 %.  Wt Readings from Last 3 Encounters:  12/31/22 237 lb 4.8 oz (107.6 kg)  11/27/22 236 lb 9.6 oz (107.3 kg)  11/21/22 235 lb (106.6 kg)    Body mass index is 34.05 kg/m.  Performance status (ECOG): 1 - Symptomatic but completely ambulatory  PHYSICAL EXAM:   Physical Exam Vitals and nursing note reviewed. Exam conducted with a chaperone present.  Constitutional:      Appearance: Normal appearance.  Cardiovascular:     Rate and Rhythm:  Normal rate and regular rhythm.     Pulses: Normal pulses.     Heart sounds: Normal heart sounds.  Pulmonary:     Effort: Pulmonary effort is normal.     Breath sounds: Normal breath sounds.  Abdominal:     Palpations: Abdomen is soft. There is no hepatomegaly, splenomegaly or mass.     Tenderness: There is no abdominal tenderness.  Musculoskeletal:     Right lower leg: No edema.     Left lower leg: No edema.  Lymphadenopathy:     Cervical: No cervical adenopathy.     Right cervical: No superficial, deep or posterior cervical adenopathy.    Left cervical: No superficial, deep or posterior cervical adenopathy.     Upper Body:     Right upper body: No supraclavicular or axillary adenopathy.     Left upper body: No supraclavicular or axillary adenopathy.  Neurological:     General: No focal deficit present.     Mental Status: He is alert and oriented to person, place, and time.  Psychiatric:        Mood and Affect: Mood normal.        Behavior: Behavior normal.     LABS:      Latest Ref Rng & Units 12/25/2022   11:19 AM 09/11/2022    2:27 PM 08/01/2022   11:23 AM  CBC  WBC 4.0 - 10.5 K/uL 5.4  6.8  6.4   Hemoglobin 13.0 - 17.0 g/dL 10.1  11.9  10.5   Hematocrit 39.0 - 52.0 % 32.4  36.5  32.6    Platelets 150 - 400 K/uL 276  254  313       Latest Ref Rng & Units 08/01/2022   11:23 AM 06/28/2022    8:26 AM 06/13/2022    3:14 PM  CMP  Glucose 70 - 99 mg/dL 218  198  134   BUN 8 - 27 mg/dL 22  23  15    Creatinine 0.76 - 1.27 mg/dL 1.32  1.15  1.10   Sodium 134 - 144 mmol/L 142  140  140   Potassium 3.5 - 5.2 mmol/L 5.2  3.8  4.9   Chloride 96 - 106 mmol/L 98  100  98   CO2 20 - 29 mmol/L 32  26  24   Calcium 8.6 - 10.2 mg/dL 9.4  9.3  9.5   Total Protein 6.0 - 8.5 g/dL 6.9     Total Bilirubin 0.0 - 1.2 mg/dL <0.2     Alkaline Phos 44 - 121 IU/L 75     AST 0 - 40 IU/L 15     ALT 0 - 44 IU/L 11        No results found for: "CEA1", "CEA" / No results found for: "CEA1", "CEA" No results found for: "PSA1" No results found for: "CAN199" No results found for: "CAN125"  Lab Results  Component Value Date   TOTALPROTELP 7.0 12/25/2022   ALBUMINELP 3.5 09/11/2022   A1GS 0.3 09/11/2022   A2GS 0.8 09/11/2022   BETS 1.0 09/11/2022   GAMS 1.0 09/11/2022   MSPIKE Not Observed 09/11/2022   SPEI Comment 09/11/2022   Lab Results  Component Value Date   TIBC 433 12/25/2022   TIBC 391 09/11/2022   TIBC 380 08/27/2022   FERRITIN 14 (L) 12/25/2022   FERRITIN 33 09/11/2022   FERRITIN 26 06/08/2022   IRONPCTSAT 5 (L) 12/25/2022   IRONPCTSAT 17 (L) 09/11/2022   IRONPCTSAT 12 (L)  08/27/2022   Lab Results  Component Value Date   LDH 124 09/11/2022   LDH 162 05/29/2022     STUDIES:   Intravitreal Injection, Pharmacologic Agent - OD - Right Eye  Result Date: 12/07/2022 Time Out 12/07/2022. 2:22 PM. Confirmed correct patient, procedure, site, and patient consented. Anesthesia Topical anesthesia was used. Anesthetic medications included Lidocaine 2%, Proparacaine 0.5%. Procedure Preparation included 5% betadine to ocular surface, eyelid speculum. A (32g) needle was used. Injection: 2 mg aflibercept 2 MG/0.05ML   Route: Intravitreal, Site: Right Eye   NDC: [id number], Lot:  [id number], Expiration date: 12/13/2023, Waste: 0 mL Post-op Post injection exam found visual acuity of at least counting fingers. The patient tolerated the procedure well. There were no complications. The patient received written and verbal post procedure care education.   Intravitreal Injection, Pharmacologic Agent - OS - Left Eye  Result Date: 12/07/2022 Time Out 12/07/2022. 2:22 PM. Confirmed correct patient, procedure, site, and patient consented. Anesthesia Topical anesthesia was used. Anesthetic medications included Lidocaine 2%, Proparacaine 0.5%. Procedure Preparation included 5% betadine to ocular surface, eyelid speculum. A (32g) needle was used. Injection: 6 mg faricimab-svoa 6 MG/0.05ML   Route: Intravitreal, Site: Left Eye   NDCYD:[id number], Lot: [id number], Expiration date: 04/13/2024, Waste: 0 mL Post-op Post injection exam found visual acuity of at least counting fingers. The patient tolerated the procedure well. There were no complications. The patient received written and verbal post procedure care education. Post injection medications were not given.   OCT, Retina - OU - Both Eyes  Result Date: 12/07/2022 Right Eye Quality was good. Central Foveal Thickness: 409. Progression has been stable. Findings include no SRF, abnormal foveal contour, intraretinal fluid, vitreomacular adhesion (Persistent IRF/IRHM centrally, stable release of VMA to partial PVD). Left Eye Quality was good. Central Foveal Thickness: 268. Progression has improved. Findings include no SRF, abnormal foveal contour, intraretinal hyper-reflective material, intraretinal fluid (interval improvement in IRF/cystic changes centrally; partial PVD). 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 CME - cystoid macular edema. PED - pigment epithelial detachment. IRF - intraretinal fluid. SRF - subretinal fluid. EZ - ellipsoid zone. ERM - epiretinal membrane. ORA - outer retinal atrophy. ORT - outer retinal tubulation. SRHM - subretinal hyper-reflective material. IRHM - intraretinal hyper-reflective material

## 2022-12-31 NOTE — Patient Instructions (Addendum)
[facility]  Discharge Instructions  You were seen and examined today by Dr. Delton Coombes.  Dr. Delton Coombes discussed your most recent lab work which revealed that your iron levels are low.  Dr. Delton Coombes recommends having IV Iron infusion 2 doses.  Continue taking vitamin B12.  Follow-up as scheduled.    Thank you for choosing [facility] to provide your oncology and hematology care.   To afford each patient quality time with our provider, please arrive at least 15 minutes before your scheduled appointment time. You may need to reschedule your appointment if you arrive late (10 or more minutes). Arriving late affects you and other patients whose appointments are after yours.  Also, if you miss three or more appointments without notifying the office, you may be dismissed from the clinic at the provider's discretion.    Again, thank you for choosing [facility].  Our hope is that these requests will decrease the amount of time that you wait before being seen by our physicians.   If you have a lab appointment with the [facility] please come in thru the Main Entrance and check in at the main information desk.           _____________________________________________________________  Should you have questions after your visit to [facility], please contact our office at [phone] and follow the prompts.  Our office hours are 8:00 a.m. to 4:30 p.m. Monday - Thursday and 8:00 a.m. to 2:30 p.m. Friday.  Please note that voicemails left after 4:00 p.m. may not be returned until the following business day.  We are closed weekends and all major holidays.  You do have access to a nurse 24-7, just call the main number to the clinic [phone] and do not press any options, hold on the line and a nurse will answer the phone.    For prescription refill requests, have your pharmacy contact our office and allow 72  hours.    Masks are optional in the cancer centers. If you would like for your care team to wear a mask while they are taking care of you, please let them know. You may have one support person who is at least 80 years old accompany you for your appointments.

## 2023-01-01 ENCOUNTER — Ambulatory Visit: Payer: Medicare Other | Admitting: Physician Assistant

## 2023-01-02 ENCOUNTER — Inpatient Hospital Stay: Payer: Medicare Other

## 2023-01-02 VITALS — BP 166/72 | HR 61 | Temp 97.4°F | Resp 18

## 2023-01-02 DIAGNOSIS — D500 Iron deficiency anemia secondary to blood loss (chronic): Secondary | ICD-10-CM

## 2023-01-02 DIAGNOSIS — D509 Iron deficiency anemia, unspecified: Secondary | ICD-10-CM | POA: Diagnosis not present

## 2023-01-02 MED ORDER — ACETAMINOPHEN 325 MG PO TABS
650.0000 mg | ORAL_TABLET | Freq: Once | ORAL | Status: AC
  Administered 2023-01-02: 650 mg via ORAL
  Filled 2023-01-02: qty 2

## 2023-01-02 MED ORDER — SODIUM CHLORIDE 0.9 % IV SOLN: Freq: Once | INTRAVENOUS | Status: AC

## 2023-01-02 MED ORDER — SODIUM CHLORIDE 0.9 % IV SOLN
510.0000 mg | Freq: Once | INTRAVENOUS | Status: AC
  Administered 2023-01-02: 510 mg via INTRAVENOUS
  Filled 2023-01-02: qty 510

## 2023-01-02 MED ORDER — CETIRIZINE HCL 10 MG PO TABS
10.0000 mg | ORAL_TABLET | Freq: Once | ORAL | Status: AC
  Administered 2023-01-02: 10 mg via ORAL
  Filled 2023-01-02: qty 1

## 2023-01-02 NOTE — Progress Notes (Signed)
Patient presents today for iron infusion. Patient is in satisfactory condition with no new complaints voiced.  Vital signs are stable.  We will proceed with infusion per provider orders.    Peripheral IV started with good blood return pre and post infusion.  Feraheme given today per MD orders. Tolerated infusion without adverse affects. Vital signs stable. No complaints at this time. Discharged from clinic ambulatory in stable condition. Alert and oriented x 3. F/U with [facility] as scheduled.

## 2023-01-02 NOTE — Patient Instructions (Signed)
[facility] [place] [facility]  Discharge Instructions: Thank you for choosing [facility] to provide your oncology and hematology care.  If you have a lab appointment with the Cancer Center, please come in thru the Main Entrance and check in at the main information desk.  Wear comfortable clothing and clothing appropriate for easy access to any Portacath or PICC line.   We strive to give you quality time with your provider. You may need to reschedule your appointment if you arrive late (15 or more minutes).  Arriving late affects you and other patients whose appointments are after yours.  Also, if you miss three or more appointments without notifying the office, you may be dismissed from the clinic at the provider's discretion.      For prescription refill requests, have your pharmacy contact our office and allow 72 hours for refills to be completed.    Today you received Feraheme IV iron infusion.     BELOW ARE SYMPTOMS THAT SHOULD BE REPORTED IMMEDIATELY: *FEVER GREATER THAN 100.4 F (38 C) OR HIGHER *CHILLS OR SWEATING *NAUSEA AND VOMITING THAT IS NOT CONTROLLED WITH YOUR NAUSEA MEDICATION *UNUSUAL SHORTNESS OF BREATH *UNUSUAL BRUISING OR BLEEDING *URINARY PROBLEMS (pain or burning when urinating, or frequent urination) *BOWEL PROBLEMS (unusual diarrhea, constipation, pain near the anus) TENDERNESS IN MOUTH AND THROAT WITH OR WITHOUT PRESENCE OF ULCERS (sore throat, sores in mouth, or a toothache) UNUSUAL RASH, SWELLING OR PAIN  UNUSUAL VAGINAL DISCHARGE OR ITCHING   Items with * indicate a potential emergency and should be followed up as soon as possible or go to the Emergency Department if any problems should occur.  Please show the CHEMOTHERAPY ALERT CARD or IMMUNOTHERAPY ALERT CARD at check-in to the Emergency Department and triage nurse.  Should you have questions after your visit or need to cancel or reschedule your appointment, please contact [facility] [phone]  and follow the prompts.  Office hours are 8:00 a.m. to 4:30 p.m. Monday - Friday. Please note that voicemails left after 4:00 p.m. may not be returned until the following business day.  We are closed weekends and major holidays. You have access to a nurse at all times for urgent questions. Please call the main number to the clinic [phone] and follow the prompts.  For any non-urgent questions, you may also contact your provider using MyChart. We now offer e-Visits for anyone 18 and older to request care online for non-urgent symptoms. For details visit [email or web address].   Also download the MyChart app! Go to the app store, search "MyChart", open the app, select [facility], and log in with your MyChart username and password.   

## 2023-01-03 ENCOUNTER — Encounter (HOSPITAL_COMMUNITY): Payer: Self-pay | Admitting: Gastroenterology

## 2023-01-03 NOTE — Progress Notes (Signed)
[facility] Note  01/04/2023    CHIEF COMPLAINT Patient presents for Retina Follow Up  HISTORY OF PRESENT ILLNESS: Lucas Dudley is a 82 y.o. male who presents to the clinic today for:  HPI     Retina Follow Up   Patient presents with  Diabetic Retinopathy.  In both eyes.  This started years ago.  Duration of 4 weeks.  I, the attending physician,  performed the HPI with the patient and updated documentation appropriately.        Comments   Patient states that he can not notice a change in his vision because the vision is so bad. He is using Cosopt OU BID and AT's OU PRN. His blood sugar was 109.      Last edited by Bernarda Caffey, MD on 01/04/2023  3:33 PM.     Patient states his left eye is "giving me a fit", he states it is very dry even though he is using Systane  Referring physician: [name], MD [place],  [place] [address]  HISTORICAL INFORMATION:   Selected notes from the MEDICAL RECORD NUMBER Referred by Dr. Kathlen Mody for DEE   CURRENT MEDICATIONS: Current Outpatient Medications (Ophthalmic Drugs)  Medication Sig   COMBIGAN 0.2-0.5 % ophthalmic solution Apply 1 drop to eye 2 (two) times daily.   No current facility-administered medications for this visit. (Ophthalmic Drugs)   Current Outpatient Medications (Other)  Medication Sig   albuterol (VENTOLIN HFA) 108 (90 Base) MCG/ACT inhaler Inhale 2 puffs into the lungs every 4 (four) hours as needed for wheezing or shortness of breath.   amiodarone (PACERONE) 200 MG tablet TAKE 1 TABLET BY MOUTH EVERY DAY   Ascorbic Acid (VITAMIN C PO) Take by mouth.   aspirin 81 MG chewable tablet Chew 81 mg by mouth daily.   camphor-menthol (ANTI-ITCH) lotion Apply 1 Application topically daily as needed for itching.   carvedilol (COREG) 3.125 MG tablet TAKE 1 TABLET BY MOUTH 2 TIMES DAILY.   CRANBERRY PO Take 1 tablet by mouth daily.   cyanocobalamin (VITAMIN B12) 1000 MCG tablet  Take 1 tablet (1,000 mcg total) by mouth daily.   diphenhydramine-acetaminophen (TYLENOL PM) 25-500 MG TABS tablet Take 2 tablets by mouth at bedtime.   docusate sodium (COLACE) 100 MG capsule Take 1 capsule (100 mg total) by mouth daily. (Patient taking differently: Take 100 mg by mouth 2 (two) times daily.)   ELDERBERRY PO Take by mouth.   ferrous sulfate 325 (65 FE) MG tablet Take 1 tablet (325 mg total) by mouth daily with breakfast.   gemfibrozil (LOPID) 600 MG tablet Take 600 mg by mouth at bedtime.    glimepiride (AMARYL) 2 MG tablet Take 2 mg by mouth at bedtime.   Insulin Glargine (BASAGLAR KWIKPEN) 100 UNIT/ML Inject 32 Units into the skin daily.   KLOR-CON M20 20 MEQ tablet TAKE 1 TABLET BY MOUTH EVERY DAY   lisinopril (ZESTRIL) 20 MG tablet TAKE 1 TABLET BY MOUTH EVERY DAY   Melatonin 10 MG TABS Take 10 mg by mouth at bedtime.   metFORMIN (GLUCOPHAGE-XR) 500 MG 24 hr tablet Take 1,000 mg by mouth in the morning and at bedtime.   mineral oil liquid Take 15 mLs by mouth daily as needed for moderate constipation.   Multiple Vitamins-Minerals (MULTIVITAMIN WITH MINERALS) tablet Take 1 tablet by mouth daily. Centrum men 50+   Omega-3 1000 MG CAPS Take 1,000 mg by mouth daily.  pantoprazole (PROTONIX) 40 MG tablet Take 1 tablet (40 mg total) by mouth daily.   torsemide (DEMADEX) 20 MG tablet Take 1 tablet (20 mg total) by mouth 2 (two) times daily.   triamcinolone cream (KENALOG) 0.1 % Apply topically 2 (two) times daily as needed.   valACYclovir (VALTREX) 1000 MG tablet Take 1,000 mg by mouth at bedtime.   Current Facility-Administered Medications (Other)  Medication Route   0.9 %  sodium chloride infusion Intravenous   0.9 %  sodium chloride infusion Intravenous   REVIEW OF SYSTEMS: ROS   Positive for: Gastrointestinal, Endocrine, Cardiovascular, Eyes, Respiratory Negative for: Constitutional, Neurological, Skin, Genitourinary, Musculoskeletal, HENT, Psychiatric, Allergic/Imm,  Heme/Lymph Last edited by Annie Paras, COT on 01/04/2023  1:54 PM.       ALLERGIES Allergies  Allergen Reactions   Azithromycin Nausea Only   Tape Rash and Other (See Comments)    Causes skin redness, Use paper tape only.   PAST MEDICAL HISTORY Past Medical History:  Diagnosis Date   Arthritis    Atrial fibrillation (HCC)    CAD (coronary artery disease)    a. Cath 03/17/15 showing 100% ostial D1, 50% prox LAD to mid LAD, 40% RPDA stenosis. Med rx. // Myoview 01/2020: EF 31 diffuse perfusion defect without reversibility (suspect artifact); reviewed with Dr. Aggie Cosier study felt to be low risk   Cataract    Mixed form OD   Chronic diastolic CHF (congestive heart failure) (HCC)    Diabetic retinopathy ([place])    NPDR OU   Essential hypertension    Glaucoma    POAG OU   History of gout    Hyperlipidemia    Hypertensive retinopathy    OU   Kidney stones    Melanoma of neck (HCC)    NICM (nonischemic cardiomyopathy) ([place])    OSA on CPAP 2012   Prostate cancer ([place])    Type II diabetes mellitus ([place])    Past Surgical History:  Procedure Laterality Date   ABDOMINAL HERNIA REPAIR     w/mesh   ATRIAL FIBRILLATION ABLATION N/A 06/06/2021   Procedure: ATRIAL FIBRILLATION ABLATION;  Surgeon: Thompson Grayer, MD;  Location: [facility];  Service: Cardiovascular;  Laterality: N/A;   BIOPSY  05/24/2022   Procedure: BIOPSY;  Surgeon: Daneil Dolin, MD;  [facility];  Service: Endoscopy;;   BIOPSY  05/30/2022   Procedure: BIOPSY;  Surgeon: Daneil Dolin, MD;  [facility];  Service: Endoscopy;;   CARDIAC CATHETERIZATION N/A 03/17/2015   Procedure: Left Heart Cath and Coronary Angiography;  Surgeon: Lorretta Harp, MD;  Location: [facility];  Service: Cardiovascular;  Laterality: N/A;   CARDIOVERSION N/A 08/09/2021   Procedure: CARDIOVERSION;  Surgeon: [name], MD;  Location: [facility];  Service: Cardiovascular;  Laterality:  N/A;   CARDIOVERSION N/A 11/10/2021   Procedure: CARDIOVERSION;  Surgeon: Berniece Salines, DO;  Location: [facility];  Service: Cardiovascular;  Laterality: N/A;   carotid doppler  09/17/2008   rigt and left ICAs 0-49%;mildly  abnormal   CATARACT EXTRACTION Left 2020   Dr. Kathlen Mody   COLONOSCOPY N/A 05/16/2017   Procedure: COLONOSCOPY;  Surgeon: Rogene Houston, MD;  [facility];  Service: Endoscopy;  Laterality: N/A;  930   COLONOSCOPY WITH PROPOFOL N/A 05/24/2022   Procedure: COLONOSCOPY WITH PROPOFOL;  Surgeon: Daneil Dolin, MD;  [facility];  Service: Endoscopy;  Laterality: N/A;   DOPPLER ECHOCARDIOGRAPHY  05/25/2009   EF  50-55%,LA mildly dilated, LV function normal   ELECTROPHYSIOLOGIC STUDY N/A 04/05/2015   Procedure: Cardioversion;  Surgeon: Sanda Klein, MD;  Location: [facility];  Service: Cardiovascular;  Laterality: N/A;   ELECTROPHYSIOLOGIC STUDY N/A 09/06/2015   Procedure: Atrial Fibrillation Ablation;  Surgeon: Thompson Grayer, MD;  Location: [facility];  Service: Cardiovascular;  Laterality: N/A;   ELECTROPHYSIOLOGIC STUDY N/A 07/12/2016   redo afib ablation by Dr Rayann Heman   ENTEROSCOPY N/A 05/30/2022   Procedure: ENTEROSCOPY;  Surgeon: Daneil Dolin, MD;  [facility];  Service: Endoscopy;  Laterality: N/A;   ESOPHAGOGASTRODUODENOSCOPY (EGD) WITH PROPOFOL N/A 05/24/2022   Procedure: ESOPHAGOGASTRODUODENOSCOPY (EGD) WITH PROPOFOL;  Surgeon: Daneil Dolin, MD;  [facility];  Service: Endoscopy;  Laterality: N/A;   EXCISIONAL HEMORRHOIDECTOMY     "inside and out"   EYE SURGERY Left 2020   Cat Sx - Dr. Kathlen Mody   FINE NEEDLE ASPIRATION Right    knee; "drew ~ 1 quart off"   GIVENS CAPSULE STUDY N/A 05/25/2022   Procedure: GIVENS CAPSULE STUDY;  Surgeon: Eloise Harman, DO;  [facility];  Service: Endoscopy;  Laterality: N/A;   HERNIA REPAIR     LAPAROSCOPIC CHOLECYSTECTOMY     LEFT ATRIAL APPENDAGE  OCCLUSION N/A 06/28/2022   Procedure: LEFT ATRIAL APPENDAGE OCCLUSION;  Surgeon: Sherren Mocha, MD;  Location: [facility];  Service: Cardiovascular;  Laterality: N/A;   MELANOMA EXCISION Right    "neck"   NM MYOCAR PERF WALL MOTION  02/21/2012   EF 61% ,EXERCISE 7 METS. exercise stopped due to wheezing and shortness of breathe   POLYPECTOMY  05/16/2017   Procedure: POLYPECTOMY;  Surgeon: Rogene Houston, MD;  [facility];  Service: Endoscopy;;  colon   POLYPECTOMY  05/24/2022   Procedure: POLYPECTOMY;  Surgeon: Daneil Dolin, MD;  [facility];  Service: Endoscopy;;   PROSTATECTOMY     SHOULDER OPEN ROTATOR CUFF REPAIR Right X 2   SUBMUCOSAL TATTOO INJECTION  05/30/2022   Procedure: SUBMUCOSAL TATTOO INJECTION;  Surgeon: Daneil Dolin, MD;  [facility];  Service: Endoscopy;;   TEE WITHOUT CARDIOVERSION N/A 09/05/2015   Procedure: TRANSESOPHAGEAL ECHOCARDIOGRAM (TEE);  Surgeon: Sueanne Margarita, MD;  Location[facility];  Service: Cardiovascular;  Laterality: N/A;   TEE WITHOUT CARDIOVERSION N/A 06/28/2022   Procedure: TRANSESOPHAGEAL ECHOCARDIOGRAM (TEE);  Surgeon: Sherren Mocha, MD;  Location: [facility];  Service: Cardiovascular;  Laterality: N/A;   FAMILY HISTORY Family History  Problem Relation Age of Onset   Heart disease Mother    Lung cancer Mother    Heart attack Mother 44   Diabetes Father    Heart disease Father    Stroke Brother    Healthy Daughter    Colon cancer Maternal Aunt        101s   SOCIAL HISTORY Social History   Tobacco Use   Smoking status: Former    Packs/day: 2.00    Years: 27.00    Additional pack years: 0.00    Total pack years: 54.00    Types: Cigarettes    Quit date: 1992    Years since quitting: 32.2    Passive exposure: Current   Smokeless tobacco: Never   Tobacco comments:    Former smoker 09/27/21  Vaping Use   Vaping Use: Never used  Substance Use Topics   Alcohol use: No     Alcohol/week: 0.0 standard drinks  of alcohol    Comment: "used to drink; stopped ~ 2008"   Drug use: No       OPHTHALMIC EXAM: Base Eye Exam     Visual Acuity (Snellen - Linear)       Right Left   Dist cc 20/30 +2 20/25 +2   Dist ph cc NI     Correction: Glasses         Tonometry (Tonopen, 1:58 PM)       Right Left   Pressure 18 15         Pupils       Dark Light Shape React APD   Right 2 1 Round Brisk None   Left 2 1 Round Brisk None         Visual Fields       Left Right    Full Full         Extraocular Movement       Right Left    Full, Ortho Full, Ortho         Neuro/Psych     Oriented x3: Yes   Mood/Affect: Normal         Dilation     Both eyes: 1.0% Mydriacyl, 2.5% Phenylephrine @ 1:54 PM           Slit Lamp and Fundus Exam     Slit Lamp Exam       Right Left   Lids/Lashes Dermato, mild MGD Dermato, mild MGD   Conjunctiva/Sclera Temporal pinguecula, mild inferior sub conj heme Temporal pinguecula   Cornea EBMD, mild haze, trace PEE, mild Debris in tear film, well healed cataract wound trace haze, trace PEE, well healed temporal cataract wounds, arcus   Anterior Chamber Deep and clear; narrow temporal angle Deep and clear   Iris Round and dilated, mild anterior bowing, No NVI Round and moderately dilated to 5.62mm   Lens PCIOL in good position, trace PCO PC IOL in good position with open PC   Anterior Vitreous Synerisis Synerisis         Fundus Exam       Right Left   Disc Mild pallor, sharp rim, +cupping, thin inferior rim Mild pallor, sharp rim, +cupping, +PPA   C/D Ratio 0.7 0.6   Macula Flat, blunted foveal reflex, cystic changes - slightly improved, RPE mottling and clumping, no heme, Drusen, punctate Exudates Flat, blunted foveal reflex, clusters of fine MA nasal and superior to fovea -- stably improved, cystic changes -- resolved   Vessels mild attenuation, mild tortuosity Attenuated, mild av crossing changes, mild  tortuousity   Periphery Attached, rare MA, forcal DBH nasal to disc--improved Attached. No heme.           Refraction     Wearing Rx       Sphere Cylinder Axis Add   Right -0.25 +2.00 158 +2.50   Left -1.00 +1.25 008 +2.50           IMAGING AND PROCEDURES  Imaging and Procedures for 01/04/2023  OCT, Retina - OU - Both Eyes       Right Eye Quality was good. Central Foveal Thickness: 383. Progression has improved. Findings include no SRF, abnormal foveal contour, intraretinal fluid, vitreomacular adhesion (Mild interval improvement in IRF/IRHM and foveal contour, partial PVD).   Left Eye Quality was good. Central Foveal Thickness: 271. Progression has improved. Findings include normal foveal contour, no IRF, no SRF, intraretinal hyper-reflective material (interval improvement in IRF/cystic changes centrally -- just trace  IRHM remains; partial PVD).   Notes *Images captured and stored on drive  Diagnosis / Impression:  DME OU OD: Mild interval improvement in IRF/IRHM and foveal contour, partial PVD OS: interval improvement in IRF/cystic changes centrally -- just trace IRHM remains; partial PVD  Clinical management:  See below  Abbreviations: NFP - Normal foveal profile. CME - cystoid macular edema. PED - pigment epithelial detachment. IRF - intraretinal fluid. SRF - subretinal fluid. EZ - ellipsoid zone. ERM - epiretinal membrane. ORA - outer retinal atrophy. ORT - outer retinal tubulation. SRHM - subretinal hyper-reflective material. IRHM - intraretinal hyper-reflective material     Intravitreal Injection, Pharmacologic Agent - OD - Right Eye       Time Out 01/04/2023. 3:10 PM. Confirmed correct patient, procedure, site, and patient consented.   Anesthesia Topical anesthesia was used. Anesthetic medications included Lidocaine 2%, Proparacaine 0.5%.   Procedure Preparation included 5% betadine to ocular surface, eyelid speculum. A (32g) needle was used.    Injection: 6 mg faricimab-svoa 6 MG/0.05ML   Route: Intravitreal, Site: Right Eye   NDC: [id number], Lot: [id number], Expiration date: 11/14/2024, Waste: 0 mL   Post-op Post injection exam found visual acuity of at least counting fingers. The patient tolerated the procedure well. There were no complications. The patient received written and verbal post procedure care education.      Intravitreal Injection, Pharmacologic Agent - OS - Left Eye       Time Out 01/04/2023. 3:11 PM. Confirmed correct patient, procedure, site, and patient consented.   Anesthesia Topical anesthesia was used. Anesthetic medications included Lidocaine 2%, Proparacaine 0.5%.   Procedure Preparation included 5% betadine to ocular surface, eyelid speculum. A (32g) needle was used.   Injection: 6 mg faricimab-svoa 6 MG/0.05ML   Route: Intravitreal, Site: Left Eye   NDCYD:[id number], Lot: [id number], Expiration date: 12/12/2024, Waste: 0 mL   Post-op Post injection exam found visual acuity of at least counting fingers. The patient tolerated the procedure well. There were no complications. The patient received written and verbal post procedure care education. Post injection medications were not given.             ASSESSMENT/PLAN:   ICD-10-CM   1. 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 Essential hypertension  I10     3. Hypertensive retinopathy of both eyes  H35.033     4. Pseudophakia of both eyes  Z96.1     5. 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 History of herpes zoster of eye  Z86.19      7. Primary open angle glaucoma of both eyes, unspecified glaucoma stage  H40.1130       1. 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 Single focal MA superior to fovea; OS: Perifoveal Mas w/ late leakage - s/p IVA OS # 1(07.26.21), #2 (08.24.21), #3 (09.21.21), #4 (10.22.21) -- IVA resistance - s/p IVE OS #1 (11.22.21), #2 (12.21.21), #3 (01.20.22), #4 (02.18.22), #5 (03.18.22), #6 (04.15.22), #7 (05.10.22) -- sample, #8 (06.08.22), #9 (07.12.22), #10 (08.12.22), #11 (09.16.22), #12 (10.21.22), #13 (11.18.22), #  14 (12.21.22), #15 (01.20.23), #16 (02.17.23), #17 (03.24.23), #18 (04.21.23), #19 (05.26.23), #20 (06.23.23) -- IVE resistance - s/p IVV OS #1 (07.21.23), #2 (08.25.23), #3 (09.29.23), #4 (10.30.23), #5 (11.27.23) #6 (12.29.23), #7 (01.26.23) #8(02.23.2024) - s/p IVE OD #1 (09.29.23), #2 (10.30.23), #3 (SAMPLE) (11.27.23) #4 (12.29.23) #5 (01.26.24) #6 (02.23.24) - BCVA 20/30, 20/25 OS  - OCT shows OD: Mild interval improvement in IRF/IRHM and foveal contour, partial PVD; OS: interval improvement in IRF/cystic changes centrally -- just trace IRHM remains; partial PVD  - recommend switching to IVV OD #1 and IVV OS #9 today, 03.22.24 with f/u in 4 weeks  - pt wishes to proceed with injections  - RBA of procedure discussed, questions answered  - Vabysmo informed consent obtained and signed, 03.22.24 (OD)  - Vabysmo informed consent obtained and signed, 07.21.23 (OS) - IVA informed consent obtained and signed, 07.26.21 (OS) - IVE informed consent obtained and signed, 04.21.23 (OS) - IVE informed consent obtained and signed, 09.29.23 (OD) - Eylea approved until 10/04/23 - [employer] approved Vabysmo - see procedure note - f/u 4 weeks -- DFE/OCT, possible injections  2,3. Hypertensive retinopathy OU - discussed importance of tight BP control - continue to monitor  4. Pseudophakia OU  - s/p CE/IOL OS (2020, Dr. Kathlen Mody)             - s/p CE/IOL  OD (2022, Dr. Kathlen Mody)  - s/p YAG cap OS (04.04.23) - BCVA 20/30 OD and 20/25 OS from 20/150  - IOLs in good position, doing well  - continue to monitor  5. 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 Susa Simmonds in August 2022  - s/p SuperK OS on 12.12.2022  6. History of herpes zoster/iritis OS  - on valtrex 1 g daily---maintenance  7. POAG OU  - was under the expert management of Dr. Kathlen Mody, now follows with Dr. Lucianne Lei  - IOP 18, 15   - continue Cosopt bid OU  Ophthalmic Meds Ordered this visit:  Meds ordered this encounter  Medications   faricimab-svoa (VABYSMO) 6mg /0.38mL intravitreal injection   faricimab-svoa (VABYSMO) 6mg /0.62mL intravitreal injection     This document serves as a record of services personally performed by Gardiner Sleeper, MD, PhD. It was created on their behalf by Joetta Manners COT, an ophthalmic technician. The creation of this record is the provider's dictation and/or activities during the visit.    Electronically signed by: Joetta Manners COT 03.21.2024 3:35 PM  This document serves as a record of services personally performed by Gardiner Sleeper, MD, PhD. It was created on their behalf by San Jetty. Owens Shark, OA an ophthalmic technician. The creation of this record is the provider's dictation and/or activities during the visit.    Electronically signed by: San Jetty. Owens Shark, [place] 03.22.2024 3:35 PM  Gardiner Sleeper, M.D., Ph.D. Diseases & Surgery of the Retina and [place] 01/04/2023   I have reviewed the above documentation for accuracy and completeness, and I agree with the above. Gardiner Sleeper, M.D., Ph.D. 01/04/23 3:36 PM   Abbreviations: M myopia (nearsighted); A astigmatism; H hyperopia (farsighted); P presbyopia; Mrx spectacle prescription;  CTL contact lenses; OD right eye; OS left eye; OU both eyes  XT exotropia; ET esotropia; PEK punctate epithelial keratitis; PEE punctate epithelial erosions; DES dry  eye syndrome; MGD meibomian gland dysfunction; ATs artificial tears; PFAT's preservative free artificial tears; [place] nuclear sclerotic cataract; PSC posterior subcapsular cataract; ERM epi-retinal membrane; PVD posterior  vitreous detachment; RD retinal detachment; DM diabetes mellitus; DR diabetic retinopathy; NPDR non-proliferative diabetic retinopathy; PDR proliferative diabetic retinopathy; CSME clinically significant macular edema; DME diabetic macular edema; dbh dot blot hemorrhages; CWS cotton wool spot; POAG primary open angle glaucoma; C/D cup-to-disc ratio; HVF humphrey visual field; GVF goldmann visual field; OCT optical coherence tomography; IOP intraocular pressure; BRVO Branch retinal vein occlusion; CRVO central retinal vein occlusion; CRAO central retinal artery occlusion; BRAO branch retinal artery occlusion; RT retinal tear; SB scleral buckle; PPV pars plana vitrectomy; VH Vitreous hemorrhage; PRP panretinal laser photocoagulation; IVK intravitreal kenalog; VMT vitreomacular traction; MH Macular hole;  NVD neovascularization of the disc; NVE neovascularization elsewhere; AREDS age related eye disease study; ARMD age related macular degeneration; POAG primary open angle glaucoma; EBMD epithelial/anterior basement membrane dystrophy; ACIOL anterior chamber intraocular lens; IOL intraocular lens; PCIOL posterior chamber intraocular lens; Phaco/IOL phacoemulsification with intraocular lens placement; [place] photorefractive keratectomy; LASIK laser assisted in situ keratomileusis; HTN hypertension; DM diabetes mellitus; COPD chronic obstructive pulmonary disease

## 2023-01-04 ENCOUNTER — Encounter (INDEPENDENT_AMBULATORY_CARE_PROVIDER_SITE_OTHER): Payer: Self-pay | Admitting: Ophthalmology

## 2023-01-04 ENCOUNTER — Ambulatory Visit (INDEPENDENT_AMBULATORY_CARE_PROVIDER_SITE_OTHER): Payer: Medicare Other | Admitting: Ophthalmology

## 2023-01-04 DIAGNOSIS — E113213 Type 2 diabetes mellitus with mild nonproliferative diabetic retinopathy with macular edema, bilateral: Secondary | ICD-10-CM

## 2023-01-04 DIAGNOSIS — H401130 Primary open-angle glaucoma, bilateral, stage unspecified: Secondary | ICD-10-CM | POA: Diagnosis not present

## 2023-01-04 DIAGNOSIS — Z961 Presence of intraocular lens: Secondary | ICD-10-CM

## 2023-01-04 DIAGNOSIS — I10 Essential (primary) hypertension: Secondary | ICD-10-CM | POA: Diagnosis not present

## 2023-01-04 DIAGNOSIS — Z8619 Personal history of other infectious and parasitic diseases: Secondary | ICD-10-CM

## 2023-01-04 DIAGNOSIS — H35033 Hypertensive retinopathy, bilateral: Secondary | ICD-10-CM

## 2023-01-04 DIAGNOSIS — H18523 Epithelial (juvenile) corneal dystrophy, bilateral: Secondary | ICD-10-CM

## 2023-01-04 MED ORDER — FARICIMAB-SVOA 6 MG/0.05ML IZ SOLN
6.0000 mg | INTRAVITREAL | Status: AC | PRN
  Administered 2023-01-04: 6 mg via INTRAVITREAL

## 2023-01-08 ENCOUNTER — Other Ambulatory Visit (HOSPITAL_COMMUNITY): Payer: Self-pay | Admitting: Nurse Practitioner

## 2023-01-09 ENCOUNTER — Inpatient Hospital Stay: Payer: Medicare Other

## 2023-01-09 ENCOUNTER — Other Ambulatory Visit (HOSPITAL_COMMUNITY): Payer: Self-pay | Admitting: Nurse Practitioner

## 2023-01-09 VITALS — BP 158/66 | HR 59 | Temp 97.0°F | Resp 20

## 2023-01-09 DIAGNOSIS — D509 Iron deficiency anemia, unspecified: Secondary | ICD-10-CM | POA: Diagnosis not present

## 2023-01-09 DIAGNOSIS — D500 Iron deficiency anemia secondary to blood loss (chronic): Secondary | ICD-10-CM

## 2023-01-09 MED ORDER — SODIUM CHLORIDE 0.9 % IV SOLN
510.0000 mg | Freq: Once | INTRAVENOUS | Status: AC
  Administered 2023-01-09: 510 mg via INTRAVENOUS
  Filled 2023-01-09: qty 510

## 2023-01-09 MED ORDER — CETIRIZINE HCL 10 MG PO TABS
10.0000 mg | ORAL_TABLET | Freq: Once | ORAL | Status: AC
  Administered 2023-01-09: 10 mg via ORAL
  Filled 2023-01-09: qty 1

## 2023-01-09 MED ORDER — ACETAMINOPHEN 325 MG PO TABS
650.0000 mg | ORAL_TABLET | Freq: Once | ORAL | Status: DC
  Filled 2023-01-09: qty 2

## 2023-01-09 MED ORDER — SODIUM CHLORIDE 0.9 % IV SOLN: Freq: Once | INTRAVENOUS | Status: AC

## 2023-01-09 NOTE — Patient Instructions (Signed)
[facility]  Discharge Instructions: Thank you for choosing [facility] to provide your oncology and hematology care.  If you have a lab appointment with the Cancer Center, please come in thru the Main Entrance and check in at the main information desk.  Wear comfortable clothing and clothing appropriate for easy access to any Portacath or PICC line.   We strive to give you quality time with your provider. You may need to reschedule your appointment if you arrive late (15 or more minutes).  Arriving late affects you and other patients whose appointments are after yours.  Also, if you miss three or more appointments without notifying the office, you may be dismissed from the clinic at the provider's discretion.      For prescription refill requests, have your pharmacy contact our office and allow 72 hours for refills to be completed.    Today you received the following chemotherapy and/or immunotherapy agents Feraheme      To help prevent nausea and vomiting after your treatment, we encourage you to take your nausea medication as directed.  BELOW ARE SYMPTOMS THAT SHOULD BE REPORTED IMMEDIATELY: *FEVER GREATER THAN 100.4 F (38 C) OR HIGHER *CHILLS OR SWEATING *NAUSEA AND VOMITING THAT IS NOT CONTROLLED WITH YOUR NAUSEA MEDICATION *UNUSUAL SHORTNESS OF BREATH *UNUSUAL BRUISING OR BLEEDING *URINARY PROBLEMS (pain or burning when urinating, or frequent urination) *BOWEL PROBLEMS (unusual diarrhea, constipation, pain near the anus) TENDERNESS IN MOUTH AND THROAT WITH OR WITHOUT PRESENCE OF ULCERS (sore throat, sores in mouth, or a toothache) UNUSUAL RASH, SWELLING OR PAIN  UNUSUAL VAGINAL DISCHARGE OR ITCHING   Items with * indicate a potential emergency and should be followed up as soon as possible or go to the Emergency Department if any problems should occur.  Please show the CHEMOTHERAPY ALERT CARD or IMMUNOTHERAPY ALERT CARD at check-in to the  Emergency Department and triage nurse.  Should you have questions after your visit or need to cancel or reschedule your appointment, please contact [facility] [phone]  and follow the prompts.  Office hours are 8:00 a.m. to 4:30 p.m. Monday - Friday. Please note that voicemails left after 4:00 p.m. may not be returned until the following business day.  We are closed weekends and major holidays. You have access to a nurse at all times for urgent questions. Please call the main number to the clinic [phone] and follow the prompts.  For any non-urgent questions, you may also contact your provider using MyChart. We now offer e-Visits for anyone 18 and older to request care online for non-urgent symptoms. For details visit [email or web address].   Also download the MyChart app! Go to the app store, search "MyChart", open the app, select [facility], and log in with your MyChart username and password.   

## 2023-01-09 NOTE — Progress Notes (Signed)
Patient presents today for Feraheme infusion per providers order.  Vital signs WNL.  Patient has no new complaints at this time.  Peripheral IV started and blood return noted pre and post infusion.  Stable during infusion without adverse affects.  Vital signs stable.  No complaints at this time.  Discharge from clinic ambulatory in stable condition.  Alert and oriented X 3.  Follow up with [facility] as scheduled.  

## 2023-01-10 ENCOUNTER — Ambulatory Visit (HOSPITAL_BASED_OUTPATIENT_CLINIC_OR_DEPARTMENT_OTHER): Payer: Medicare Other | Admitting: Anesthesiology

## 2023-01-10 ENCOUNTER — Encounter (HOSPITAL_COMMUNITY)
Admission: RE | Disposition: A | Discharge status: Home / Self Care | Payer: Self-pay | Source: Home / Self Care | Attending: Gastroenterology

## 2023-01-10 ENCOUNTER — Ambulatory Visit (HOSPITAL_COMMUNITY)
Admission: RE | Admit: 2023-01-10 | Discharge: 2023-01-10 | Disposition: A | Discharge status: Home / Self Care | Payer: Medicare Other | Attending: Gastroenterology | Admitting: Gastroenterology

## 2023-01-10 ENCOUNTER — Other Ambulatory Visit: Payer: Self-pay

## 2023-01-10 ENCOUNTER — Encounter (HOSPITAL_COMMUNITY): Payer: Self-pay | Admitting: Gastroenterology

## 2023-01-10 ENCOUNTER — Ambulatory Visit (HOSPITAL_COMMUNITY): Payer: Medicare Other | Admitting: Anesthesiology

## 2023-01-10 DIAGNOSIS — Z87891 Personal history of nicotine dependence: Secondary | ICD-10-CM | POA: Insufficient documentation

## 2023-01-10 DIAGNOSIS — K269 Duodenal ulcer, unspecified as acute or chronic, without hemorrhage or perforation: Secondary | ICD-10-CM

## 2023-01-10 DIAGNOSIS — K31819 Angiodysplasia of stomach and duodenum without bleeding: Secondary | ICD-10-CM | POA: Diagnosis not present

## 2023-01-10 DIAGNOSIS — K317 Polyp of stomach and duodenum: Secondary | ICD-10-CM

## 2023-01-10 DIAGNOSIS — G4733 Obstructive sleep apnea (adult) (pediatric): Secondary | ICD-10-CM | POA: Diagnosis not present

## 2023-01-10 DIAGNOSIS — I2510 Atherosclerotic heart disease of native coronary artery without angina pectoris: Secondary | ICD-10-CM | POA: Insufficient documentation

## 2023-01-10 DIAGNOSIS — I4891 Unspecified atrial fibrillation: Secondary | ICD-10-CM | POA: Insufficient documentation

## 2023-01-10 DIAGNOSIS — K2950 Unspecified chronic gastritis without bleeding: Secondary | ICD-10-CM | POA: Insufficient documentation

## 2023-01-10 DIAGNOSIS — K3189 Other diseases of stomach and duodenum: Secondary | ICD-10-CM | POA: Diagnosis present

## 2023-01-10 DIAGNOSIS — I5032 Chronic diastolic (congestive) heart failure: Secondary | ICD-10-CM | POA: Insufficient documentation

## 2023-01-10 DIAGNOSIS — K449 Diaphragmatic hernia without obstruction or gangrene: Secondary | ICD-10-CM | POA: Insufficient documentation

## 2023-01-10 DIAGNOSIS — K2980 Duodenitis without bleeding: Secondary | ICD-10-CM | POA: Insufficient documentation

## 2023-01-10 DIAGNOSIS — K5520 Angiodysplasia of colon without hemorrhage: Secondary | ICD-10-CM | POA: Diagnosis not present

## 2023-01-10 DIAGNOSIS — K2289 Other specified disease of esophagus: Secondary | ICD-10-CM | POA: Diagnosis not present

## 2023-01-10 DIAGNOSIS — K2990 Gastroduodenitis, unspecified, without bleeding: Secondary | ICD-10-CM | POA: Diagnosis not present

## 2023-01-10 DIAGNOSIS — E119 Type 2 diabetes mellitus without complications: Secondary | ICD-10-CM | POA: Diagnosis not present

## 2023-01-10 DIAGNOSIS — M1990 Unspecified osteoarthritis, unspecified site: Secondary | ICD-10-CM

## 2023-01-10 DIAGNOSIS — I110 Hypertensive heart disease with heart failure: Secondary | ICD-10-CM | POA: Diagnosis not present

## 2023-01-10 DIAGNOSIS — Z833 Family history of diabetes mellitus: Secondary | ICD-10-CM | POA: Insufficient documentation

## 2023-01-10 DIAGNOSIS — D1339 Benign neoplasm of other parts of small intestine: Secondary | ICD-10-CM

## 2023-01-10 DIAGNOSIS — Z8249 Family history of ischemic heart disease and other diseases of the circulatory system: Secondary | ICD-10-CM | POA: Diagnosis not present

## 2023-01-10 HISTORY — PX: POLYPECTOMY: SHX5525

## 2023-01-10 HISTORY — PX: HOT HEMOSTASIS: SHX5433

## 2023-01-10 HISTORY — PX: BIOPSY: SHX5522

## 2023-01-10 HISTORY — PX: EUS: SHX5427

## 2023-01-10 HISTORY — PX: ESOPHAGOGASTRODUODENOSCOPY (EGD) WITH PROPOFOL: SHX5813

## 2023-01-10 HISTORY — PX: SUBMUCOSAL TATTOO INJECTION: SHX6856

## 2023-01-10 LAB — GLUCOSE, CAPILLARY
Glucose-Capillary: 242 mg/dL — ABNORMAL HIGH (ref 70–99)
Glucose-Capillary: 212 mg/dL — ABNORMAL HIGH (ref 70–99)

## 2023-01-10 SURGERY — ESOPHAGOGASTRODUODENOSCOPY (EGD) WITH PROPOFOL
Anesthesia: Monitor Anesthesia Care

## 2023-01-10 MED ORDER — LACTATED RINGERS IV SOLN: INTRAVENOUS | Status: DC

## 2023-01-10 MED ORDER — SPOT INK MARKER SYRINGE KIT
PACK | SUBMUCOSAL | Status: AC
  Filled 2023-01-10: qty 5

## 2023-01-10 MED ORDER — PROPOFOL 1000 MG/100ML IV EMUL
INTRAVENOUS | Status: AC
  Filled 2023-01-10: qty 100

## 2023-01-10 MED ORDER — PROPOFOL 500 MG/50ML IV EMUL
INTRAVENOUS | Status: DC | PRN
  Administered 2023-01-10: 125 ug/kg/min via INTRAVENOUS

## 2023-01-10 MED ORDER — SPOT INK MARKER SYRINGE KIT
PACK | SUBMUCOSAL | Status: DC | PRN
  Administered 2023-01-10: 2 mL via SUBMUCOSAL

## 2023-01-10 MED ORDER — PANTOPRAZOLE SODIUM 40 MG PO TBEC: 40.0000 mg | DELAYED_RELEASE_TABLET | Freq: Two times a day (BID) | ORAL | 12 refills | Status: DC

## 2023-01-10 MED ORDER — PROPOFOL 10 MG/ML IV BOLUS
INTRAVENOUS | Status: DC | PRN
  Administered 2023-01-10: 20 mg via INTRAVENOUS
  Administered 2023-01-10: 10 mg via INTRAVENOUS

## 2023-01-10 MED ORDER — GLYCOPYRROLATE 0.2 MG/ML IJ SOLN
INTRAMUSCULAR | Status: DC | PRN
  Administered 2023-01-10: .2 mg via INTRAVENOUS

## 2023-01-10 MED ORDER — SODIUM CHLORIDE 0.9 % IV SOLN: INTRAVENOUS | Status: DC

## 2023-01-10 MED ORDER — LIDOCAINE 2% (20 MG/ML) 5 ML SYRINGE
INTRAMUSCULAR | Status: DC | PRN
  Administered 2023-01-10: 100 mg via INTRAVENOUS

## 2023-01-10 SURGICAL SUPPLY — 15 items

## 2023-01-10 NOTE — Discharge Instructions (Signed)
YOU HAD AN ENDOSCOPIC PROCEDURE TODAY: Refer to the procedure report and other information in the discharge instructions given to you for any specific questions about what was found during the examination. If this information does not answer your questions, please call [place] office at [phone] to clarify.   YOU SHOULD EXPECT: Some feelings of bloating in the abdomen. Passage of more gas than usual. Walking can help get rid of the air that was put into your GI tract during the procedure and reduce the bloating. If you had a lower endoscopy (such as a colonoscopy or flexible sigmoidoscopy) you may notice spotting of blood in your stool or on the toilet paper. Some abdominal soreness may be present for a day or two, also.  DIET: Your first meal following the procedure should be a light meal and then it is ok to progress to your normal diet. A half-sandwich or bowl of soup is an example of a good first meal. Heavy or fried foods are harder to digest and may make you feel nauseous or bloated. Drink plenty of fluids but you should avoid alcoholic beverages for 24 hours. If you had a esophageal dilation, please see attached instructions for diet.    ACTIVITY: Your care partner should take you home directly after the procedure. You should plan to take it easy, moving slowly for the rest of the day. You can resume normal activity the day after the procedure however YOU SHOULD NOT DRIVE, use power tools, machinery or perform tasks that involve climbing or major physical exertion for 24 hours (because of the sedation medicines used during the test).   SYMPTOMS TO REPORT IMMEDIATELY: A gastroenterologist can be reached at any hour. Please call [phone]  for any of the following symptoms:   Following upper endoscopy (EGD, EUS, ERCP, esophageal dilation) Vomiting of blood or coffee ground material  New, significant abdominal pain  New, significant chest pain or pain under the shoulder blades  Painful or  persistently difficult swallowing  New shortness of breath  Black, tarry-looking or red, bloody stools  FOLLOW UP:  If any biopsies were taken you will be contacted by phone or by letter within the next 1-3 weeks. Call [phone]  if you have not heard about the biopsies in 3 weeks.  Please also call with any specific questions about appointments or follow up tests.  

## 2023-01-10 NOTE — Op Note (Signed)
[facility] Patient Name: Lucas Dudley Procedure Date: 01/10/2023 MRN: [id number] Attending MD: Justice Britain , MD, [id number] Date of Birth: Jun 12, 1941 CSN: [id number] Age: 82 Admit Type: Outpatient Procedure:                Upper EUS Indications:              Duodenal/jejunal deformity on                            endoscopy/Subepithelial tumor vs. extrinsic                            compression, history of AVM small bowel Providers:                Justice Britain, MD, Jeanella Cara, RN,                            Cletis Athens, Technician Referring MD:             Norvel Richards, MD, Maylon Peppers, [name] Medicines:                Monitored Anesthesia Care Complications:            No immediate complications. Estimated Blood Loss:     Estimated blood loss was minimal. Procedure:                Pre-Anesthesia Assessment:                           - Prior to the procedure, a History and Physical                            was performed, and patient medications and                            allergies were reviewed. The patient's tolerance of                            previous anesthesia was also reviewed. The risks                            and benefits of the procedure and the sedation                            options and risks were discussed with the patient.                            All questions were answered, and informed consent                            was obtained. Prior Anticoagulants: The patient has                            taken no anticoagulant or  antiplatelet agents                            except for aspirin. ASA Grade Assessment: III - A                            patient with severe systemic disease. After                            reviewing the risks and benefits, the patient was                            deemed in satisfactory condition to undergo the                             procedure.                           After obtaining informed consent, the endoscope was                            passed under direct vision. Throughout the                            procedure, the patient's blood pressure, pulse, and                            oxygen saturations were monitored continuously. The                            [id number]-[id number] [id number]) Olympus colonoscope was                            introduced through the mouth, and advanced to the                            second part of duodenum. The was introduced through                            the mouth, and advanced to the proximal jejunum for                            ultrasound examination. After obtaining informed                            consent, the endoscope was passed under direct                            vision. Throughout the procedure, the patient's                            blood pressure, pulse, and oxygen saturations were  monitored continuously.The upper GI endoscopy was                            accomplished without difficulty. The patient                            tolerated the procedure. Scope In: Scope Out: Findings:      ENDOSCOPIC FINDING: :      No gross lesions were noted in the entire esophagus.      The Z-line was irregular and was found 41 cm from the incisors.      A 1 cm hiatal hernia was present.      Patchy moderate mucosal changes characterized by congestion and       nodularity were found in the gastric antrum. Biopsies were taken with a       cold forceps for histology.      No gross lesions were noted in the entire examined stomach.      A single 5 mm sessile polyp was found in the duodenal bulb. The polyp       was removed with a cold snare. Resection and retrieval were complete.      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 Fulguration to ablate the       lesion to  prevent bleeding by argon plasma (right colon settings) was       successful.      2 tattoos were seen in the very proximal jejunum (previously placed       elsewhere).      Between the 2 previously placed tattoos, a 10 mm       Subepithelial/submucosal lesion with no bleeding was found in the       jejunum. It was not hard to palpation.      A single diminutive angiodysplastic lesion with typical arborization was       found in the proximal jejunum. Fulguration to ablate the lesion to       prevent bleeding by argon plasma (right colon settings) was successful.      Normal mucosa was found in the rest of the visualized proximal jejunum.       Area was tattooed with an injection of Spot (carbon black) to demarcate       the distal extent of today's SBE.      ENDOSONOGRAPHIC FINDING: :      A lobulated intramural (subepithelial) lesion was found in the jejunum       (noted on mini probe EUS today). The lesion was hypoechoic.       Endosonographically, the lesion appeared to originate from within the       deep mucosa (Layer 2). The lesion measured 9 mm (in maximum thickness).       The lesion also measured 6 mm in diameter. The outer margins were well       defined. Impression:               EGD impression:                           - No gross lesions in the entire esophagus. Z-line  irregular, 41 cm from the incisors.                           - 1 cm hiatal hernia.                           - Congested and nodular mucosa in the antrum.                            Biopsied.                           - No other gross lesions in the entire stomach.                           - A single duodenal polyp in the bulb. Resected and                            retrieved.                           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                            Treated with argon plasma coagulation (APC).                           - 2 tattoos were seen in the  proximal jejunum.                           - Within the 2 tattoos, a subepithelial lesion was                            noted.                           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                            Treated with argon plasma coagulation (APC).                           - Normal mucosa was found in the rest of the                            visualized proximal jejunum. Tattooed the distal                            extent of today's SBE.                           EUS impression:                           - An intramural (subepithelial) lesion was found.  The lesion appeared to originate from within the                            deep mucosa (Layer 2). Tissue has not been                            obtained. However, the endosonographic appearance                            could be concerning for a duplication cyst versus                            leiomyoma versus GIST. Moderate Sedation:      Not Applicable - Patient had care per Anesthesia. Recommendation:           - The patient will be observed post-procedure,                            until all discharge criteria are met.                           - Discharge patient to home.                           - Patient has a contact number available for                            emergencies. The signs and symptoms of potential                            delayed complications were discussed with the                            patient. Return to normal activities tomorrow.                            Written discharge instructions were provided to the                            patient.                           - Resume previous diet.                           - Increase PPI to 40 mg twice daily for 1 month                            then may decrease back to once daily.                           - Continue present medications otherwise.                           - Await path  results.                           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 I am not                            sure that any further follow-up needs to be                            performed at this patient's age and with his other                            medical comorbidities unless something else                            develops or changes. Will discuss this with the                            patient's primary gastroenterologist in [place].                            A true CT enterography could be considered in the                            future.                           - The findings and recommendations were discussed                            with the patient.                           - The findings and recommendations were discussed                            with the patient's family. Procedure Code(s):        --- Professional ---                           [id number], 59, Esophagogastroduodenoscopy, flexible,  transoral; with control of bleeding, any method                           43251, Esophagogastroduodenoscopy, flexible,                            transoral; with removal of tumor(s), polyp(s), or                            other lesion(s) by snare technique                           43237, Esophagogastroduodenoscopy, flexible,                            transoral; with endoscopic ultrasound examination                            limited to the esophagus, stomach or duodenum, and                            adjacent structures                           43239, 59,  Esophagogastroduodenoscopy, flexible,                            transoral; with biopsy, single or multiple Diagnosis Code(s):        --- Professional ---                           K22.89, Other specified disease of esophagus                           K44.9, Diaphragmatic hernia without obstruction or                            gangrene                           K31.89, Other diseases of stomach and duodenum                           K31.7, Polyp of stomach and duodenum                           K31.819, Angiodysplasia of stomach and duodenum                            without bleeding                           D13.39, Benign neoplasm of other parts of small                            intestine  K55.20, Angiodysplasia of colon without hemorrhage CPT copyright 2022 American Medical Association. All rights reserved. The codes documented in this report are preliminary and upon coder review may  be revised to meet current compliance requirements. Justice Britain, MD 01/10/2023 11:10:22 AM Number of Addenda: 0

## 2023-01-10 NOTE — Transfer of Care (Signed)
Immediate Anesthesia Transfer of Care Note  Patient: Lucas Dudley  Procedure(s) Performed: ESOPHAGOGASTRODUODENOSCOPY (EGD) WITH PROPOFOL UPPER ENDOSCOPIC ULTRASOUND (EUS) RADIAL SUBMUCOSAL TATTOO INJECTION HOT HEMOSTASIS (ARGON PLASMA COAGULATION/BICAP) BIOPSY POLYPECTOMY  Patient Location: PACU  Anesthesia Type:MAC  Level of Consciousness: sedated  Airway & Oxygen Therapy: Patient Spontanous Breathing and Patient connected to face mask oxygen  Post-op Assessment: Report given to RN and Post -op Vital signs reviewed and stable  Post vital signs: Reviewed and stable  Last Vitals:  Vitals Value Taken Time  BP    Temp    Pulse 47 01/10/23 1053  Resp 18 01/10/23 1053  SpO2 92 % 01/10/23 1053  Vitals shown include unvalidated device data.  Last Pain:  Vitals:   01/10/23 0916  TempSrc: Temporal  PainSc: 0-No pain         Complications: No notable events documented.

## 2023-01-10 NOTE — H&P (Signed)
GASTROENTEROLOGY PROCEDURE H&P NOTE   Primary Care Physician: [name], MD  HPI: Lucas Dudley is a 82 y.o. male who presents for SBE/EUS to evaluate a distal duodenal/proximal jejunal SEL.  Past Medical History:  Diagnosis Date   Arthritis    Atrial fibrillation (HCC)    CAD (coronary artery disease)    a. Cath 03/17/15 showing 100% ostial D1, 50% prox LAD to mid LAD, 40% RPDA stenosis. Med rx. // Myoview 01/2020: EF 31 diffuse perfusion defect without reversibility (suspect artifact); reviewed with Dr. Aggie Cosier study felt to be low risk   Cataract    Mixed form OD   Chronic diastolic CHF (congestive heart failure) (HCC)    Diabetic retinopathy ([place])    NPDR OU   Essential hypertension    Glaucoma    POAG OU   History of gout    Hyperlipidemia    Hypertensive retinopathy    OU   Kidney stones    Melanoma of neck (HCC)    NICM (nonischemic cardiomyopathy) ([place])    OSA on CPAP 2012   Prostate cancer ([place])    Type II diabetes mellitus ([place])    Past Surgical History:  Procedure Laterality Date   ABDOMINAL HERNIA REPAIR     w/mesh   ATRIAL FIBRILLATION ABLATION N/A 06/06/2021   Procedure: ATRIAL FIBRILLATION ABLATION;  Surgeon: Thompson Grayer, MD;  Location: [place] [facility];  Service: Cardiovascular;  Laterality: N/A;   BIOPSY  05/24/2022   Procedure: BIOPSY;  Surgeon: Daneil Dolin, MD;  [facility];  Service: Endoscopy;;   BIOPSY  05/30/2022   Procedure: BIOPSY;  Surgeon: Daneil Dolin, MD;  [facility];  Service: Endoscopy;;   CARDIAC CATHETERIZATION N/A 03/17/2015   Procedure: Left Heart Cath and Coronary Angiography;  Surgeon: Lorretta Harp, MD;  Location: [place] [facility];  Service: Cardiovascular;  Laterality: N/A;   CARDIOVERSION N/A 08/09/2021   Procedure: CARDIOVERSION;  Surgeon: [name], MD;  Location: [place];  Service: Cardiovascular;  Laterality: N/A;   CARDIOVERSION N/A 11/10/2021   Procedure:  CARDIOVERSION;  Surgeon: Berniece Salines, DO;  Location: [place];  Service: Cardiovascular;  Laterality: N/A;   carotid doppler  09/17/2008   rigt and left ICAs 0-49%;mildly  abnormal   CATARACT EXTRACTION Left 2020   Dr. Kathlen Mody   COLONOSCOPY N/A 05/16/2017   Procedure: COLONOSCOPY;  Surgeon: Rogene Houston, MD;  [facility];  Service: Endoscopy;  Laterality: N/A;  930   COLONOSCOPY WITH PROPOFOL N/A 05/24/2022   Procedure: COLONOSCOPY WITH PROPOFOL;  Surgeon: Daneil Dolin, MD;  [facility];  Service: Endoscopy;  Laterality: N/A;   DOPPLER ECHOCARDIOGRAPHY  05/25/2009   EF 50-55%,LA mildly dilated, LV function normal   ELECTROPHYSIOLOGIC STUDY N/A 04/05/2015   Procedure: Cardioversion;  Surgeon: Sanda Klein, MD;  Location: [place] [facility];  Service: Cardiovascular;  Laterality: N/A;   ELECTROPHYSIOLOGIC STUDY N/A 09/06/2015   Procedure: Atrial Fibrillation Ablation;  Surgeon: Thompson Grayer, MD;  Location: [place] [facility];  Service: Cardiovascular;  Laterality: N/A;   ELECTROPHYSIOLOGIC STUDY N/A 07/12/2016   redo afib ablation by Dr Rayann Heman   ENTEROSCOPY N/A 05/30/2022   Procedure: ENTEROSCOPY;  Surgeon: Daneil Dolin, MD;  [facility];  Service: Endoscopy;  Laterality: N/A;   ESOPHAGOGASTRODUODENOSCOPY (EGD) WITH PROPOFOL N/A 05/24/2022   Procedure: ESOPHAGOGASTRODUODENOSCOPY (EGD) WITH PROPOFOL;  Surgeon: Daneil Dolin, MD;  [facility];  Service: Endoscopy;  Laterality: N/A;   EXCISIONAL HEMORRHOIDECTOMY     "inside and out"   EYE SURGERY Left 2020   Cat Sx - Dr. Kathlen Mody   FINE NEEDLE ASPIRATION Right    knee; "drew ~ 1 quart off"   GIVENS CAPSULE STUDY N/A 05/25/2022   Procedure: GIVENS CAPSULE STUDY;  Surgeon: Eloise Harman, DO;  [facility];  Service: Endoscopy;  Laterality: N/A;   HERNIA REPAIR     LAPAROSCOPIC CHOLECYSTECTOMY     LEFT ATRIAL APPENDAGE OCCLUSION N/A 06/28/2022   Procedure: LEFT ATRIAL  APPENDAGE OCCLUSION;  Surgeon: Sherren Mocha, MD;  Location: [place] [facility];  Service: Cardiovascular;  Laterality: N/A;   MELANOMA EXCISION Right    "neck"   NM MYOCAR PERF WALL MOTION  02/21/2012   EF 61% ,EXERCISE 7 METS. exercise stopped due to wheezing and shortness of breathe   POLYPECTOMY  05/16/2017   Procedure: POLYPECTOMY;  Surgeon: Rogene Houston, MD;  [facility];  Service: Endoscopy;;  colon   POLYPECTOMY  05/24/2022   Procedure: POLYPECTOMY;  Surgeon: Daneil Dolin, MD;  [facility];  Service: Endoscopy;;   PROSTATECTOMY     SHOULDER OPEN ROTATOR CUFF REPAIR Right X 2   SUBMUCOSAL TATTOO INJECTION  05/30/2022   Procedure: SUBMUCOSAL TATTOO INJECTION;  Surgeon: Daneil Dolin, MD;  [facility];  Service: Endoscopy;;   TEE WITHOUT CARDIOVERSION N/A 09/05/2015   Procedure: TRANSESOPHAGEAL ECHOCARDIOGRAM (TEE);  Surgeon: Sueanne Margarita, MD;  Location: [facility];  Service: Cardiovascular;  Laterality: N/A;   TEE WITHOUT CARDIOVERSION N/A 06/28/2022   Procedure: TRANSESOPHAGEAL ECHOCARDIOGRAM (TEE);  Surgeon: Sherren Mocha, MD;  Location: [place] [facility];  Service: Cardiovascular;  Laterality: N/A;   Current Facility-Administered Medications  Medication Dose Route Frequency Provider Last Rate Last Admin   0.9 %  sodium chloride infusion   Intravenous Continuous Mansouraty, Telford Nab., MD       lactated ringers infusion   Intravenous Continuous Mansouraty, Telford Nab., MD 50 mL/hr at 01/10/23 0930 New Bag at 01/10/23 0930    Current Facility-Administered Medications:    0.9 %  sodium chloride infusion, , Intravenous, Continuous, Mansouraty, Telford Nab., MD   lactated ringers infusion, , Intravenous, Continuous, Mansouraty, Telford Nab., MD, Last Rate: 50 mL/hr at 01/10/23 0930, New Bag at 01/10/23 0930 Allergies  Allergen Reactions   Azithromycin Nausea Only   Tape Rash and Other (See Comments)    Causes skin redness, Use paper  tape only.   Family History  Problem Relation Age of Onset   Heart disease Mother    Lung cancer Mother    Heart attack Mother 20   Diabetes Father    Heart disease Father    Stroke Brother    Healthy Daughter    Colon cancer Maternal Aunt        36s   Social History   Socioeconomic History   Marital status: Widowed    Spouse name: Not on file   Number of children: 1   Years of education: Not on file   Highest education level: Not on file  Occupational History   Occupation: Retired  Tobacco Use   Smoking status: Former    Packs/day: 2.00    Years: 27.00    Additional pack years: 0.00    Total pack years: 54.00    Types: Cigarettes    Quit date: 1992    Years since quitting: 32.2    Passive exposure: Current  Smokeless tobacco: Never   Tobacco comments:    Former smoker 09/27/21  Vaping Use   Vaping Use: Never used  Substance and Sexual Activity   Alcohol use: No    Alcohol/week: 0.0 standard drinks of alcohol    Comment: "used to drink; stopped ~ 2008"   Drug use: No   Sexual activity: Not Currently  Other Topics Concern   Not on file  Social History Narrative   Lives in [place], [place] with wife.   Social Determinants of Health   Financial Resource Strain: Not on file  Food Insecurity: No Food Insecurity (09/11/2022)   Hunger Vital Sign    Worried About Running Out of Food in the Last Year: Never true    Ran Out of Food in the Last Year: Never true  Transportation Needs: No Transportation Needs (09/11/2022)   PRAPARE - Hydrologist (Medical): No    Lack of Transportation (Non-Medical): No  Physical Activity: Not on file  Stress: Not on file  Social Connections: Not on file  Intimate Partner Violence: Not At Risk (09/11/2022)   Humiliation, Afraid, Rape, and Kick questionnaire    Fear of Current or Ex-Partner: No    Emotionally Abused: No    Physically Abused: No    Sexually Abused: No    Physical Exam: Today's Vitals    01/10/23 0916  BP: (!) 149/52  Pulse: (!) 50  Resp: 20  Temp: (!) 97.4 F (36.3 C)  TempSrc: Temporal  SpO2: 95%  Weight: 105.7 kg  Height: 5\' 11"  (1.803 m)  PainSc: 0-No pain   Body mass index is 32.5 kg/m. GEN: NAD EYE: Sclerae anicteric ENT: MMM CV: Non-tachycardic GI: Soft, NT/ND NEURO:  Alert & Oriented x 3  Lab Results: No results for input(s): "WBC", "HGB", "HCT", "PLT" in the last 72 hours. BMET No results for input(s): "NA", "K", "CL", "CO2", "GLUCOSE", "BUN", "CREATININE", "CALCIUM" in the last 72 hours. LFT No results for input(s): "PROT", "ALBUMIN", "AST", "ALT", "ALKPHOS", "BILITOT", "BILIDIR", "IBILI" in the last 72 hours. PT/INR No results for input(s): "LABPROT", "INR" in the last 72 hours.   Impression / Plan: This is a 82 y.o.male who presents for SBE/EUS to evaluate a distal duodenal/proximal jejunal SEL.  The risks of an EUS including intestinal perforation, bleeding, infection, aspiration, and medication effects were discussed as was the possibility it may not give a definitive diagnosis if a biopsy is performed.  When a biopsy of the pancreas is done as part of the EUS, there is an additional risk of pancreatitis at the rate of about 1-2%.  It was explained that procedure related pancreatitis is typically mild, although it can be severe and even life threatening, which is why we do not perform random pancreatic biopsies and only biopsy a lesion/area we feel is concerning enough to warrant the risk.   The risks and benefits of endoscopic evaluation/treatment were discussed with the patient and/or family; these include but are not limited to the risk of perforation, infection, bleeding, missed lesions, lack of diagnosis, severe illness requiring hospitalization, as well as anesthesia and sedation related illnesses.  The patient's history has been reviewed, patient examined, no change in status, and deemed stable for procedure.  The patient and/or family is  agreeable to proceed.    Justice Britain, MD [facility] # [id number]

## 2023-01-10 NOTE — Anesthesia Postprocedure Evaluation (Signed)
Anesthesia Post Note  Patient: Lucas Dudley  Procedure(s) Performed: ESOPHAGOGASTRODUODENOSCOPY (EGD) WITH PROPOFOL UPPER ENDOSCOPIC ULTRASOUND (EUS) RADIAL SUBMUCOSAL TATTOO INJECTION HOT HEMOSTASIS (ARGON PLASMA COAGULATION/BICAP) BIOPSY POLYPECTOMY     Patient location during evaluation: PACU Anesthesia Type: MAC Level of consciousness: awake and alert Pain management: pain level controlled Vital Signs Assessment: post-procedure vital signs reviewed and stable Respiratory status: spontaneous breathing, nonlabored ventilation, respiratory function stable and patient connected to nasal cannula oxygen Cardiovascular status: stable and blood pressure returned to baseline Postop Assessment: no apparent nausea or vomiting Anesthetic complications: no   No notable events documented.  Last Vitals:  Vitals:   01/10/23 1150 01/10/23 1156  BP: (!) 123/51   Pulse: (!) 57 (!) 56  Resp: 19 15  Temp:    SpO2: 96% 97%    Last Pain:  Vitals:   01/10/23 1150  TempSrc:   PainSc: 0-No pain                 Belenda Cruise P Makila Colombe

## 2023-01-10 NOTE — Anesthesia Preprocedure Evaluation (Addendum)
Anesthesia Evaluation  Patient identified by MRN, date of birth, ID band Patient awake    Reviewed: Allergy & Precautions, NPO status , Patient's Chart, lab work & pertinent test results  Airway Mallampati: II  TM Distance: >3 FB Neck ROM: Full    Dental  (+) Upper Dentures, Lower Dentures   Pulmonary sleep apnea and Continuous Positive Airway Pressure Ventilation , former smoker   Pulmonary exam normal        Cardiovascular hypertension, Pt. on medications and Pt. on home beta blockers + CAD and +CHF  + dysrhythmias Atrial Fibrillation  Rhythm:Regular Rate:Normal  ECHO 09/23:  1. Left ventricular ejection fraction, by estimation, is 55 to 60%. The  left ventricle has normal function. The left ventricle has no regional  wall motion abnormalities.   2. Right ventricular systolic function is normal. The right ventricular  size is normal. Tricuspid regurgitation signal is inadequate for assessing  PA pressure.   3. After the procedure there is a well-seated 31 mm Watchman FLX  appendage occlusion device, with adequate compression and no evidence of  peridevice leak. After the procedure there is a tiny atrial septal defect  in the fossa ovalis, with exclusively  left-to-right shunt. Left atrial size was mild to moderately dilated. No  left atrial/left atrial appendage thrombus was detected.   4. Right atrial size was mildly dilated.   5. The mitral valve is normal in structure. Mild mitral valve  regurgitation.   6. The aortic valve is tricuspid. Aortic valve regurgitation is not  visualized. No aortic stenosis is present.   7. There is mild (Grade II) layered plaque.     Neuro/Psych negative neurological ROS  negative psych ROS   GI/Hepatic negative GI ROS, Neg liver ROS,,,  Endo/Other  diabetes, Type 2, Oral Hypoglycemic Agents, Insulin Dependent    Renal/GU   negative genitourinary   Musculoskeletal  (+) Arthritis  , Osteoarthritis,    Abdominal Normal abdominal exam  (+)   Peds  Hematology  (+) Blood dyscrasia, anemia   Anesthesia Other Findings   Reproductive/Obstetrics                             Anesthesia Physical Anesthesia Plan  ASA: 3  Anesthesia Plan: MAC   Post-op Pain Management:    Induction: Intravenous  PONV Risk Score and Plan: 1 and Propofol infusion and Treatment may vary due to age or medical condition  Airway Management Planned: Simple Face Mask and Nasal Cannula  Additional Equipment: None  Intra-op Plan:   Post-operative Plan:   Informed Consent: I have reviewed the patients History and Physical, chart, labs and discussed the procedure including the risks, benefits and alternatives for the proposed anesthesia with the patient or authorized representative who has indicated his/her understanding and acceptance.     Dental advisory given  Plan Discussed with: CRNA  Anesthesia Plan Comments:        Anesthesia Quick Evaluation

## 2023-01-14 ENCOUNTER — Encounter (HOSPITAL_COMMUNITY): Payer: Self-pay | Admitting: Gastroenterology

## 2023-01-14 ENCOUNTER — Telehealth (INDEPENDENT_AMBULATORY_CARE_PROVIDER_SITE_OTHER): Payer: Self-pay | Admitting: Gastroenterology

## 2023-01-14 LAB — SURGICAL PATHOLOGY

## 2023-01-14 NOTE — Telephone Encounter (Signed)
Lucas Quale, MD  Mansouraty, Telford Nab., MD; Gabriel Rung, NP; Vicente Males, LPN Thanks a lot [place], very useful. I do agree that given his comorbidities, it may not be ideal to start a very aggressive workup, especially as the lesion does not have any highly concerning features.Thanks for the update regarding the IDA status.  Hi [name],  Can you please schedule a capsule endoscopy? Dx: IDA.  Thanks,  Maylon Peppers, MD Gastroenterology and Hepatology [facility] Gastroenterology   Givens capsule scheduled for 01/23/23. Arrive at 8 am. Instructions read to patient and will also mail them.

## 2023-01-16 ENCOUNTER — Other Ambulatory Visit: Payer: Self-pay | Admitting: Cardiology

## 2023-01-18 ENCOUNTER — Encounter: Payer: Self-pay | Admitting: Gastroenterology

## 2023-01-23 ENCOUNTER — Ambulatory Visit (HOSPITAL_COMMUNITY)
Admission: RE | Admit: 2023-01-23 | Discharge: 2023-01-23 | Disposition: A | Discharge status: Home / Self Care | Payer: Medicare Other | Attending: Gastroenterology | Admitting: Gastroenterology

## 2023-01-23 ENCOUNTER — Encounter (HOSPITAL_COMMUNITY)
Admission: RE | Disposition: A | Discharge status: Home / Self Care | Payer: Self-pay | Source: Home / Self Care | Attending: Gastroenterology

## 2023-01-23 DIAGNOSIS — K259 Gastric ulcer, unspecified as acute or chronic, without hemorrhage or perforation: Secondary | ICD-10-CM | POA: Diagnosis not present

## 2023-01-23 DIAGNOSIS — K6389 Other specified diseases of intestine: Secondary | ICD-10-CM | POA: Diagnosis not present

## 2023-01-23 DIAGNOSIS — Z9889 Other specified postprocedural states: Secondary | ICD-10-CM

## 2023-01-23 DIAGNOSIS — K5520 Angiodysplasia of colon without hemorrhage: Secondary | ICD-10-CM

## 2023-01-23 DIAGNOSIS — D500 Iron deficiency anemia secondary to blood loss (chronic): Secondary | ICD-10-CM | POA: Diagnosis not present

## 2023-01-23 HISTORY — PX: GIVENS CAPSULE STUDY: SHX5432

## 2023-01-23 SURGERY — IMAGING PROCEDURE, GI TRACT, INTRALUMINAL, VIA CAPSULE

## 2023-01-24 NOTE — Procedures (Signed)
Small Bowel G[facility]ens Capsule Study Procedure date: 01/23/2023  Referring Provider:  PCP PCP:  Dr. Gareth Morgan, MD  Indication for procedure: Recurrent iron-deficiency anemia     Findings:  Study was adequate as capsule reached the cecum and the bowel preparation was adequate in the small bowel. There was presence of superficial erosion in the stomach possibly a result of previous biopsies.  There was focal erythema x 2 in the proximal small bowel.  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  First Gastric image:  00:00:39 First Duodenal image: 00:37:59 First Cecal image: 05:03:47 Gastric Passage time: 0h 49m Small Bowel Passage time:  4h 44m  Summary & Recommendations: There was presence of superficial erosion in the stomach possibly a result of previous biopsies.  There was focal erythema x 2 in the proximal small bowel.  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  We may try to reach the distal AVMs through colonoscopy.  The patient reported at this point he is not interested in pursuing this as he is feeling better after he received the IV iron infusion.  He has a follow-up with hematology in June with repeat blood workup.  If interested in pursuing repeat colonoscopy with terminal ileum intubation, he will call us back to schedule this.  -Continue follow-up with hematology and repeat blood workup in June 2024 -Consider repeat colonoscopy with terminal ileum intubation, patient will need to reach [place] to schedule this  I personally communicated these recommendations to the patient  Katrinka Blazing, MD Gastroenterology and Hepatology [facility] Gastroenterology

## 2023-01-25 ENCOUNTER — Encounter (HOSPITAL_COMMUNITY): Payer: Self-pay | Admitting: Gastroenterology

## 2023-01-31 NOTE — Progress Notes (Signed)
[facility] - Clinic Note  02/01/2023    CHIEF COMPLAINT Patient presents for Retina Follow Up  HISTORY OF PRESENT ILLNESS: Lucas Dudley is a 82 y.o. male who presents to the clinic today for:  HPI     Retina Follow Up   Patient presents with  Diabetic Retinopathy.  In both eyes.  Severity is moderate.  Duration of 4 weeks.  Since onset it is stable.  I, the attending physician,  performed the HPI with the patient and updated documentation appropriately.        Comments   Pt here for 4 wk ret f/u NPDR OU. Pt states VA the same, he stopped using artificial tears. States the gtts were making his eyes cloudy. Now he's waking up w/ matted eye lids, watering OS.        Last edited by Rennis Chris, MD on 02/01/2023  3:31 PM.      Referring physician: Gareth Morgan, MD [address],  [place] [address]  HISTORICAL INFORMATION:   Selected notes from the MEDICAL RECORD NUMBER Referred by Dr. Alben Spittle for DEE   CURRENT MEDICATIONS: Current Outpatient Medications (Ophthalmic Drugs)  Medication Sig   Artificial Tear Ointment (DRY EYES OP) Place 1 drop into both eyes daily as needed (Dry eye).   COMBIGAN 0.2-0.5 % ophthalmic solution Place 1 drop into both eyes 2 (two) times daily.   No current facility-administered medications for this visit. (Ophthalmic Drugs)   Current Outpatient Medications (Other)  Medication Sig   acetaminophen (TYLENOL) 650 MG CR tablet Take 1,300 mg by mouth every 8 (eight) hours as needed for pain.   albuterol (VENTOLIN HFA) 108 (90 Base) MCG/ACT inhaler Inhale 2 puffs into the lungs every 4 (four) hours as needed for wheezing or shortness of breath.   amiodarone (PACERONE) 200 MG tablet TAKE 1 TABLET BY MOUTH EVERY DAY   Ascorbic Acid (VITAMIN C PO) Take 500 mg by mouth daily.   aspirin 81 MG chewable tablet Chew 81 mg by mouth daily.   camphor-menthol (ANTI-ITCH) lotion Apply 1 Application topically daily as needed for  itching.   carvedilol (COREG) 3.125 MG tablet TAKE 1 TABLET BY MOUTH 2 TIMES DAILY.   CRANBERRY PO Take 1 tablet by mouth daily.   cyanocobalamin (VITAMIN B12) 1000 MCG tablet Take 1 tablet (1,000 mcg total) by mouth daily.   docusate sodium (COLACE) 100 MG capsule Take 1 capsule (100 mg total) by mouth daily.   ELDERBERRY PO Take 2 tablets by mouth daily. Chewable   gemfibrozil (LOPID) 600 MG tablet Take 600 mg by mouth at bedtime.    glimepiride (AMARYL) 2 MG tablet Take 2 mg by mouth daily in the afternoon.   Insulin Glargine (BASAGLAR KWIKPEN) 100 UNIT/ML Inject 34 Units into the skin at bedtime.   KLOR-CON M20 20 MEQ tablet TAKE 1 TABLET BY MOUTH EVERY DAY   lisinopril (ZESTRIL) 20 MG tablet TAKE 1 TABLET BY MOUTH EVERY DAY   magnesium gluconate (MAGONATE) 500 MG tablet Take 500 mg by mouth daily.   metFORMIN (GLUCOPHAGE-XR) 500 MG 24 hr tablet Take 1,000 mg by mouth in the morning and at bedtime.   mineral oil liquid Take 15 mLs by mouth daily as needed for moderate constipation.   Multiple Vitamins-Minerals (MULTIVITAMIN WITH MINERALS) tablet Take 1 tablet by mouth daily. Centrum men 50+   Omega-3 1000 MG CAPS Take 1,000 mg by mouth daily.   OVER THE COUNTER MEDICATION Take 1 tablet  by mouth daily. Beets Chew   pantoprazole (PROTONIX) 40 MG tablet Take 1 tablet (40 mg total) by mouth 2 (two) times daily before a meal. Twice daily for 1 month then may decrease back to once daily.   torsemide (DEMADEX) 20 MG tablet TAKE 1 TABLET BY MOUTH TWICE A DAY   triamcinolone cream (KENALOG) 0.1 % Apply 1 Application topically 2 (two) times daily as needed (itch).   valACYclovir (VALTREX) 1000 MG tablet Take 1,000 mg by mouth at bedtime.   ferrous sulfate 325 (65 FE) MG tablet Take 1 tablet (325 mg total) by mouth daily with breakfast. (Patient taking differently: Take 325 mg by mouth 2 (two) times daily with a meal.)   Current Facility-Administered Medications (Other)  Medication Route   0.9 %   sodium chloride infusion Intravenous   0.9 %  sodium chloride infusion Intravenous   REVIEW OF SYSTEMS: ROS   Positive for: Gastrointestinal, Endocrine, Cardiovascular, Eyes, Respiratory Negative for: Constitutional, Neurological, Skin, Genitourinary, Musculoskeletal, HENT, Psychiatric, Allergic/Imm, Heme/Lymph Last edited by Thompson Grayer, COT on 02/01/2023  1:51 PM.     ALLERGIES Allergies  Allergen Reactions   Azithromycin Nausea Only   Tape Rash and Other (See Comments)    Causes skin redness, Use paper tape only.   PAST MEDICAL HISTORY Past Medical History:  Diagnosis Date   Arthritis    Atrial fibrillation    CAD (coronary artery disease)    a. Cath 03/17/15 showing 100% ostial D1, 50% prox LAD to mid LAD, 40% RPDA stenosis. Med rx. // Myoview 01/2020: EF 31 diffuse perfusion defect without reversibility (suspect artifact); reviewed with Dr. Tama High study felt to be low risk   Cataract    Mixed form OD   Chronic diastolic CHF (congestive heart failure)    Diabetic retinopathy    NPDR OU   Essential hypertension    Glaucoma    POAG OU   History of gout    Hyperlipidemia    Hypertensive retinopathy    OU   Kidney stones    Melanoma of neck    NICM (nonischemic cardiomyopathy)    OSA on CPAP 2012   Prostate cancer    Type II diabetes mellitus    Past Surgical History:  Procedure Laterality Date   ABDOMINAL HERNIA REPAIR     w/mesh   ATRIAL FIBRILLATION ABLATION N/A 06/06/2021   Procedure: ATRIAL FIBRILLATION ABLATION;  Surgeon: Hillis Range, MD;  Location: [facility] INVASIVE CV LAB;  Service: Cardiovascular;  Laterality: N/A;   BIOPSY  05/24/2022   Procedure: BIOPSY;  Surgeon: Corbin Ade, MD;  [facility]: [facility];  Service: Endoscopy;;   BIOPSY  05/30/2022   Procedure: BIOPSY;  Surgeon: Corbin Ade, MD;  [facility]: [facility];  Service: Endoscopy;;   BIOPSY  01/10/2023   Procedure: BIOPSY;  Surgeon: Lemar Lofty., MD;  Location: [facility]  ENDOS[facility];  Service: Gastroenterology;;   CARDIAC CATHETERIZATION N/A 03/17/2015   Procedure: Left Heart Cath and Coronary Angiography;  Surgeon: Runell Gess, MD;  Location: [facility] INVASIVE CV LAB;  Service: Cardiovascular;  Laterality: N/A;   CARDIOVERSION N/A 08/09/2021   Procedure: CARDIOVERSION;  Surgeon: Chilton Si, MD;  Location: [facility] ENDOSCOPY;  Service: Cardiovascular;  Laterality: N/A;   CARDIOVERSION N/A 11/10/2021   Procedure: CARDIOVERSION;  Surgeon: Thomasene Ripple, DO;  Location: [facility] ENDOSCOPY;  Service: Cardiovascular;  Laterality: N/A;   carotid doppler  09/17/2008   rigt and left ICAs 0-49%;mildly  abnormal   CATARACT EXTRACTION Left  2020   Dr. Alben Spittle   COLONOSCOPY N/A 05/16/2017   Procedure: COLONOSCOPY;  Surgeon: Malissa Hippo, MD;  [facility]: [facility];  Service: Endoscopy;  Laterality: N/A;  930   COLONOSCOPY WITH PROPOFOL N/A 05/24/2022   Procedure: COLONOSCOPY WITH PROPOFOL;  Surgeon: Corbin Ade, MD;  [facility];  Service: Endoscopy;  Laterality: N/A;   DOPPLER ECHOCARDIOGRAPHY  05/25/2009   EF 50-55%,LA mildly dilated, LV function normal   ELECTROPHYSIOLOGIC STUDY N/A 04/05/2015   Procedure: Cardioversion;  Surgeon: Thurmon Fair, MD;  Location: [facility] CV LAB;  Service: Cardiovascular;  Laterality: N/A;   ELECTROPHYSIOLOGIC STUDY N/A 09/06/2015   Procedure: Atrial Fibrillation Ablation;  Surgeon: Hillis Range, MD;  Location: [facility] INVASIVE CV LAB;  Service: Cardiovascular;  Laterality: N/A;   ELECTROPHYSIOLOGIC STUDY N/A 07/12/2016   redo afib ablation by Dr Johney Frame   ENTEROSCOPY N/A 05/30/2022   Procedure: ENTEROSCOPY;  Surgeon: Corbin Ade, MD;  [facility];  Service: Endoscopy;  Laterality: N/A;   ESOPHAGOGASTRODUODENOSCOPY (EGD) WITH PROPOFOL N/A 05/24/2022   Procedure: ESOPHAGOGASTRODUODENOSCOPY (EGD) WITH PROPOFOL;  Surgeon: Corbin Ade, MD;  [facility];  Service: Endoscopy;  Laterality: N/A;    ESOPHAGOGASTRODUODENOSCOPY (EGD) WITH PROPOFOL N/A 01/10/2023   Procedure: ESOPHAGOGASTRODUODENOSCOPY (EGD) WITH PROPOFOL;  Surgeon: Meridee Score Netty Starring., MD;  Location: [facility] ENDOSCOPY;  Service: Gastroenterology;  Laterality: N/A;   EUS N/A 01/10/2023   Procedure: UPPER ENDOSCOPIC ULTRASOUND (EUS) RADIAL;  Surgeon: Lemar Lofty., MD;  Location: [facility] ENDOSCOPY;  Service: Gastroenterology;  Laterality: N/A;   EXCISIONAL HEMORRHOIDECTOMY     "inside and out"   EYE SURGERY Left 2020   Cat Sx - Dr. Alben Spittle   FINE NEEDLE ASPIRATION Right    knee; "drew ~ 1 quart off"   GIVENS CAPSULE STUDY N/A 05/25/2022   Procedure: GIVENS CAPSULE STUDY;  Surgeon: Lanelle Bal, DO;  [facility];  Service: Endoscopy;  Laterality: N/A;   GIVENS CAPSULE STUDY N/A 01/23/2023   Procedure: GIVENS CAPSULE STUDY;  Surgeon: Dolores Frame, MD;  [facility];  Service: Gastroenterology;  Laterality: N/A;  8:30am   HERNIA REPAIR     HOT HEMOSTASIS N/A 01/10/2023   Procedure: HOT HEMOSTASIS (ARGON PLASMA COAGULATION/BICAP);  Surgeon: Lemar Lofty., MD;  Location: [facility];  Service: Gastroenterology;  Laterality: N/A;   LAPAROSCOPIC CHOLECYSTECTOMY     LEFT ATRIAL APPENDAGE OCCLUSION N/A 06/28/2022   Procedure: LEFT ATRIAL APPENDAGE OCCLUSION;  Surgeon: Tonny Bollman, MD;  Location: [facility] INVASIVE CV LAB;  Service: Cardiovascular;  Laterality: N/A;   MELANOMA EXCISION Right    "neck"   NM MYOCAR PERF WALL MOTION  02/21/2012   EF 61% ,EXERCISE 7 METS. exercise stopped due to wheezing and shortness of breathe   POLYPECTOMY  05/16/2017   Procedure: POLYPECTOMY;  Surgeon: Malissa Hippo, MD;  [facility];  Service: Endoscopy;;  colon   POLYPECTOMY  05/24/2022   Procedure: POLYPECTOMY;  Surgeon: Corbin Ade, MD;  [facility];  Service: Endoscopy;;   POLYPECTOMY  01/10/2023   Procedure: POLYPECTOMY;  Surgeon: Meridee Score Netty Starring., MD;   Location: [facility]OS[facility];  Service: Gastroenterology;;   PROSTATECTOMY     SHOULDER OPEN ROTATOR CUFF REPAIR Right X 2   SUBMUCOSAL TATTOO INJECTION  05/30/2022   Procedure: SUBMUCOSAL TATTOO INJECTION;  Surgeon: Corbin Ade, MD;  [facility];  Service: Endoscopy;;   SUBMUCOSAL TATTOO INJECTION  01/10/2023   Procedure: Sunnie Nielsen  TATTOO INJECTION;  Surgeon: Lemar Lofty., MD;  Location: [facility];  Service: Gastroenterology;;   TEE WITHOUT CARDIOVERSION N/A 09/05/2015   Procedure: TRANSESOPHAGEAL ECHOCARDIOGRAM (TEE);  Surgeon: Quintella Reichert, MD;  Location[facility];  Service: Cardiovascular;  Laterality: N/A;   TEE WITHOUT CARDIOVERSION N/A 06/28/2022   Procedure: TRANSESOPHAGEAL ECHOCARDIOGRAM (TEE);  Surgeon: Tonny Bollman, MD;  Location: [facility] INVASIVE CV LAB;  Service: Cardiovascular;  Laterality: N/A;   FAMILY HISTORY Family History  Problem Relation Age of Onset   Heart disease Mother    Lung cancer Mother    Heart attack Mother 43   Diabetes Father    Heart disease Father    Stroke Brother    Healthy Daughter    Colon cancer Maternal Aunt        76s   SOCIAL HISTORY Social History   Tobacco Use   Smoking status: Former    Packs/day: 2.00    Years: 27.00    Additional pack years: 0.00    Total pack years: 54.00    Types: Cigarettes    Quit date: 1992    Years since quitting: 32.3    Passive exposure: Current   Smokeless tobacco: Never   Tobacco comments:    Former smoker 09/27/21  Vaping Use   Vaping Use: Never used  Substance Use Topics   Alcohol use: No    Alcohol/week: 0.0 standard drinks of alcohol    Comment: "used to drink; stopped ~ 2008"   Drug use: No       OPHTHALMIC EXAM: Base Eye Exam     Visual Acuity (Snellen - Linear)       Right Left   Dist cc 20/30 +2 20/40   Dist ph cc  20/25    Correction: Glasses         Tonometry (Tonopen, 1:58 PM)       Right Left   Pressure 12 17         Pupils        Pupils Dark Light Shape React APD   Right PERRL 2 1 Round Brisk None   Left PERRL 2 1 Round Brisk None         Visual Fields (Counting fingers)       Left Right    Full Full         Extraocular Movement       Right Left    Full, Ortho Full, Ortho         Neuro/Psych     Oriented x3: Yes   Mood/Affect: Normal         Dilation     Both eyes: 1.0% Mydriacyl, 2.5% Phenylephrine @ 1:59 PM           Slit Lamp and Fundus Exam     Slit Lamp Exam       Right Left   Lids/Lashes Dermato, mild MGD Dermato, mild MGD   Conjunctiva/Sclera Temporal pinguecula, mild inferior sub conj heme Temporal pinguecula   Cornea EBMD, mild haze, trace PEE, mild Debris in tear film, well healed cataract wound trace haze, trace PEE, well healed temporal cataract wounds, arcus   Anterior Chamber Deep and clear; narrow temporal angle Deep and clear   Iris Round and dilated, mild anterior bowing, No NVI Round and moderately dilated to 5.29mm   Lens PCIOL in good position, trace PCO PC IOL in good position with open PC   Anterior Vitreous Synerisis Synerisis  Fundus Exam       Right Left   Disc Mild pallor, sharp rim, +cupping, thin inferior rim Mild pallor, sharp rim, +cupping, +PPA   C/D Ratio 0.7 0.6   Macula Flat, blunted foveal reflex, cystic changes - slightly improved, RPE mottling and clumping, no heme, Drusen, punctate Exudates -- slightly improved Flat, blunted foveal reflex, clusters of fine MA nasal and superior to fovea -- stably improved, cystic changes -- resolved   Vessels mild attenuation, mild tortuosity Attenuated, mild av crossing changes, mild tortuousity   Periphery Attached, rare MA, forcal DBH nasal to disc--improved Attached. No heme.           Refraction     Wearing Rx       Sphere Cylinder Axis Add   Right -0.25 +2.00 158 +2.50   Left -1.00 +1.25 008 +2.50           IMAGING AND PROCEDURES  Imaging and Procedures for 02/01/2023  OCT,  Retina - OU - Both Eyes       Right Eye Quality was good. Central Foveal Thickness: 360. Progression has improved. Findings include no SRF, abnormal foveal contour, intraretinal fluid, vitreomacular adhesion (interval improvement in IRF/IRHM and foveal contour, partial PVD).   Left Eye Quality was good. Central Foveal Thickness: 272. Progression has improved. Findings include normal foveal contour, no IRF, no SRF, intraretinal hyper-reflective material (stable improvement in IRF/cystic changes centrally; interval improvement in trace IRHM; partial PVD).   Notes *Images captured and stored on drive  Diagnosis / Impression:  DME OU OD: interval improvement in IRF/IRHM and foveal contour, partial PVD OS: stable improvement in IRF/cystic changes centrally; interval improvement in trace IRHM; partial PVD  Clinical management:  See below  Abbreviations: NFP - Normal foveal profile. CME - cystoid macular edema. PED - pigment epithelial detachment. IRF - intraretinal fluid. SRF - subretinal fluid. EZ - ellipsoid zone. ERM - epiretinal membrane. ORA - outer retinal atrophy. ORT - outer retinal tubulation. SRHM - subretinal hyper-reflective material. IRHM - intraretinal hyper-reflective material     Intravitreal Injection, Pharmacologic Agent - OD - Right Eye       Time Out 02/01/2023. 2:41 PM. Confirmed correct patient, procedure, site, and patient consented.   Anesthesia Topical anesthesia was used. Anesthetic medications included Lidocaine 2%, Proparacaine 0.5%.   Procedure Preparation included 5% betadine to ocular surface, eyelid speculum. A (32g) needle was used.   Injection: 6 mg faricimab-svoa 6 MG/0.05ML   Route: Intravitreal, Site: Right Eye   NDC: O8010301, Lot: [id number], Expiration date: 11/13/2024, Waste: 0 mL   Post-op Post injection exam found visual acuity of at least counting fingers. The patient tolerated the procedure well. There were no complications. The patient  received written and verbal post procedure care education.      Intravitreal Injection, Pharmacologic Agent - OS - Left Eye       Time Out 02/01/2023. 2:41 PM. Confirmed correct patient, procedure, site, and patient consented.   Anesthesia Topical anesthesia was used. Anesthetic medications included Lidocaine 2%, Proparacaine 0.5%.   Procedure Preparation included 5% betadine to ocular surface, eyelid speculum. A (32g) needle was used.   Injection: 6 mg faricimab-svoa 6 MG/0.05ML   Route: Intravitreal, Site: Left Eye   NDC: O8010301, Lot: [id number], Expiration date: 01/12/2025, Waste: 0 mL   Post-op Post injection exam found visual acuity of at least counting fingers. The patient tolerated the procedure well. There were no complications. The patient received written and verbal post procedure care education.  Post injection medications were not given.            ASSESSMENT/PLAN:   ICD-10-CM   1. 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 Essential hypertension  I10     3. Hypertensive retinopathy of both eyes  H35.033     4. Pseudophakia of both eyes  Z96.1     5. 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 History of herpes zoster of eye  Z86.19     7. Primary open angle glaucoma of both eyes, unspecified glaucoma stage  H40.1130      1. 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 Single focal MA superior to fovea; OS: Perifoveal Mas w/ late leakage - s/p IVA OS # 1(07.26.21), #2 (08.24.21), #3 (09.21.21), #4 (10.22.21) -- IVA  resistance - s/p IVE OS #1 (11.22.21), #2 (12.21.21), #3 (01.20.22), #4 (02.18.22), #5 (03.18.22), #6 (04.15.22), #7 (05.10.22) -- sample, #8 (06.08.22), #9 (07.12.22), #10 (08.12.22), #11 (09.16.22), #12 (10.21.22), #13 (11.18.22), #14 (12.21.22), #15 (01.20.23), #16 (02.17.23), #17 (03.24.23), #18 (04.21.23), #19 (05.26.23), #20 (06.23.23) -- IVE resistance - s/p IVV OS #1 (07.21.23), #2 (08.25.23), #3 (09.29.23), #4 (10.30.23), #5 (11.27.23) #6 (12.29.23), #7 (01.26.23) #8(02.23.2024) #9(003.22.24) - s/p IVE OD #1 (09.29.23), #2 (10.30.23), #3 (SAMPLE) (11.27.23) #4 (12.29.23) #5 (01.26.24) #6 (02.23.24) -- IVE resistance - s/p IVV OD #1 (03.22.2024) - BCVA 20/30, 20/25 OS -- stable - OCT shows OD: interval improvement in IRF/IRHM and foveal contour, partial PVD; [id number] improvement in IRF/cystic changes centrally; interval improvement in trace IRHM; partial PVD at 4 wks - recommend IVV OU (OD #2 and OS #10) today, 04.19.24 with f/u in 4 weeks  - pt wishes to proceed with injections  - RBA of procedure discussed, questions answered  - Vabysmo informed consent obtained and signed, 03.22.24 (OD)  - Vabysmo informed consent obtained and signed, 07.21.23 (OS) - IVA informed consent obtained and signed, 07.26.21 (OS) - IVE informed consent obtained and signed, 04.21.23 (OS) - IVE informed consent obtained and signed, 09.29.23 (OD) - Eylea approved until 10/04/23 - [employer] approved Vabysmo - see procedure note - f/u 4 weeks -- DFE/OCT, possible injections  2,3. Hypertensive retinopathy OU - discussed importance of tight BP control - continue to monitor  4. Pseudophakia OU  - s/p CE/IOL OS (2020, Dr. Alben Spittle)             - s/p CE/IOL OD (2022, Dr. Alben Spittle)  - s/p YAG cap OS (04.04.23) - BCVA 20/30 OD and 20/25 OS from 20/150  - IOLs in good position, doing well  - continue to monitor  5. 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 Valere Dross in August 2022  - s/p SuperK OS on  12.12.2022  6. History of herpes zoster/iritis OS  - on valtrex 1 g daily---maintenance  7. POAG OU  - was under the expert management of Dr. Alben Spittle, now follows with Dr. Zenaida Niece  - IOP 12,17  - continue Cosopt bid OU  Ophthalmic Meds Ordered this visit:  Meds ordered this encounter  Medications  faricimab-svoa (VABYSMO) 6mg /0.34mL intravitreal injection   faricimab-svoa (VABYSMO) 6mg /0.55mL intravitreal injection     This document serves as a record of services personally performed by Karie Chimera, MD, PhD. It was created on their behalf by Berlin Hun COT, an ophthalmic technician. The creation of this record is the provider's dictation and/or activities during the visit.    Electronically signed by: Berlin Hun COT 01/31/2023 2:12 AM  This document serves as a record of services personally performed by Karie Chimera, MD, PhD. It was created on their behalf by Glee Arvin. Manson Passey, OA an ophthalmic technician. The creation of this record is the provider's dictation and/or activities during the visit.    Electronically signed by: Glee Arvin. Manson Passey, [place] 04.19.2024 2:12 AM  Karie Chimera, M.D., Ph.D. Diseases & Surgery of the Retina and Vitreous [facility] 02/01/2023   I have reviewed the above documentation for accuracy and completeness, and I agree with the above. Karie Chimera, M.D., Ph.D. 02/03/23 2:14 AM  Abbreviations: M myopia (nearsighted); A astigmatism; H hyperopia (farsighted); P presbyopia; Mrx spectacle prescription;  CTL contact lenses; OD right eye; OS left eye; OU both eyes  XT exotropia; ET esotropia; PEK punctate epithelial keratitis; PEE punctate epithelial erosions; DES dry eye syndrome; MGD meibomian gland dysfunction; ATs artificial tears; PFAT's preservative free artificial tears; NSC nuclear sclerotic cataract; PSC posterior subcapsular cataract; ERM epi-retinal membrane; PVD posterior vitreous detachment; RD retinal  detachment; DM diabetes mellitus; DR diabetic retinopathy; NPDR non-proliferative diabetic retinopathy; PDR proliferative diabetic retinopathy; CSME clinically significant macular edema; DME diabetic macular edema; dbh dot blot hemorrhages; CWS cotton wool spot; POAG primary open angle glaucoma; C/D cup-to-disc ratio; HVF humphrey visual field; GVF goldmann visual field; OCT optical coherence tomography; IOP intraocular pressure; BRVO Branch retinal vein occlusion; CRVO central retinal vein occlusion; CRAO central retinal artery occlusion; BRAO branch retinal artery occlusion; RT retinal tear; SB scleral buckle; PPV pars plana vitrectomy; VH Vitreous hemorrhage; PRP panretinal laser photocoagulation; IVK intravitreal kenalog; VMT vitreomacular traction; MH Macular hole;  NVD neovascularization of the disc; NVE neovascularization elsewhere; AREDS age related eye disease study; ARMD age related macular degeneration; POAG primary open angle glaucoma; EBMD epithelial/anterior basement membrane dystrophy; ACIOL anterior chamber intraocular lens; IOL intraocular lens; PCIOL posterior chamber intraocular lens; Phaco/IOL phacoemulsification with intraocular lens placement; PRK photorefractive keratectomy; LASIK laser assisted in situ keratomileusis; HTN hypertension; DM diabetes mellitus; COPD chronic obstructive pulmonary disease

## 2023-02-01 ENCOUNTER — Encounter (INDEPENDENT_AMBULATORY_CARE_PROVIDER_SITE_OTHER): Payer: Self-pay | Admitting: Ophthalmology

## 2023-02-01 ENCOUNTER — Ambulatory Visit (INDEPENDENT_AMBULATORY_CARE_PROVIDER_SITE_OTHER): Payer: Medicare Other | Admitting: Ophthalmology

## 2023-02-01 DIAGNOSIS — Z8619 Personal history of other infectious and parasitic diseases: Secondary | ICD-10-CM

## 2023-02-01 DIAGNOSIS — I10 Essential (primary) hypertension: Secondary | ICD-10-CM | POA: Diagnosis not present

## 2023-02-01 DIAGNOSIS — H18523 Epithelial (juvenile) corneal dystrophy, bilateral: Secondary | ICD-10-CM

## 2023-02-01 DIAGNOSIS — E113213 Type 2 diabetes mellitus with mild nonproliferative diabetic retinopathy with macular edema, bilateral: Secondary | ICD-10-CM

## 2023-02-01 DIAGNOSIS — Z961 Presence of intraocular lens: Secondary | ICD-10-CM | POA: Diagnosis not present

## 2023-02-01 DIAGNOSIS — H401130 Primary open-angle glaucoma, bilateral, stage unspecified: Secondary | ICD-10-CM

## 2023-02-01 DIAGNOSIS — H35033 Hypertensive retinopathy, bilateral: Secondary | ICD-10-CM | POA: Diagnosis not present

## 2023-02-01 MED ORDER — FARICIMAB-SVOA 6 MG/0.05ML IZ SOLN
6.0000 mg | INTRAVITREAL | Status: AC | PRN
  Administered 2023-02-01: 6 mg via INTRAVITREAL

## 2023-02-26 NOTE — Progress Notes (Signed)
[facility] - Clinic Note  03/01/2023    CHIEF COMPLAINT Patient presents for Retina Follow Up  HISTORY OF PRESENT ILLNESS: Lucas Dudley is a 82 y.o. male who presents to the clinic today for:  HPI     Retina Follow Up   Patient presents with  Diabetic Retinopathy.  In both eyes.  This started 4.  I, the attending physician,  performed the HPI with the patient and updated documentation appropriately.        Comments   Patient here for 4 weeks retina follow up for NPDR OU. Patient state vision fairly well. No eye pain. Has a few streaks of pain on occasion after puts in drops.      Last edited by Rennis Chris, MD on 03/01/2023  3:14 PM.    Patient states that he fell getting the trash cans. He was not hurt. His BP is spiking up.   Referring physician: Gareth Morgan, MD [address] [place],  [place] [address]  HISTORICAL INFORMATION:   Selected notes from the MEDICAL RECORD NUMBER Referred by Dr. Alben Spittle for DEE   CURRENT MEDICATIONS: Current Outpatient Medications (Ophthalmic Drugs)  Medication Sig   Artificial Tear Ointment (DRY EYES OP) Place 1 drop into both eyes daily as needed (Dry eye).   COMBIGAN 0.2-0.5 % ophthalmic solution Place 1 drop into both eyes 2 (two) times daily.   No current facility-administered medications for this visit. (Ophthalmic Drugs)   Current Outpatient Medications (Other)  Medication Sig   acetaminophen (TYLENOL) 650 MG CR tablet Take 1,300 mg by mouth every 8 (eight) hours as needed for pain.   albuterol (VENTOLIN HFA) 108 (90 Base) MCG/ACT inhaler Inhale 2 puffs into the lungs every 4 (four) hours as needed for wheezing or shortness of breath.   amiodarone (PACERONE) 200 MG tablet TAKE 1 TABLET BY MOUTH EVERY DAY   Ascorbic Acid (VITAMIN C PO) Take 500 mg by mouth daily.   aspirin 81 MG chewable tablet Chew 81 mg by mouth daily.   camphor-menthol (ANTI-ITCH) lotion Apply 1 Application topically daily as needed  for itching.   carvedilol (COREG) 3.125 MG tablet TAKE 1 TABLET BY MOUTH 2 TIMES DAILY.   CRANBERRY PO Take 1 tablet by mouth daily.   cyanocobalamin (VITAMIN B12) 1000 MCG tablet Take 1 tablet (1,000 mcg total) by mouth daily.   docusate sodium (COLACE) 100 MG capsule Take 1 capsule (100 mg total) by mouth daily.   ELDERBERRY PO Take 2 tablets by mouth daily. Chewable   gemfibrozil (LOPID) 600 MG tablet Take 600 mg by mouth at bedtime.    glimepiride (AMARYL) 2 MG tablet Take 2 mg by mouth daily in the afternoon.   Insulin Glargine (BASAGLAR KWIKPEN) 100 UNIT/ML Inject 34 Units into the skin at bedtime.   KLOR-CON M20 20 MEQ tablet TAKE 1 TABLET BY MOUTH EVERY DAY   lisinopril (ZESTRIL) 20 MG tablet TAKE 1 TABLET BY MOUTH EVERY DAY   magnesium gluconate (MAGONATE) 500 MG tablet Take 500 mg by mouth daily.   metFORMIN (GLUCOPHAGE-XR) 500 MG 24 hr tablet Take 1,000 mg by mouth in the morning and at bedtime.   mineral oil liquid Take 15 mLs by mouth daily as needed for moderate constipation.   Multiple Vitamins-Minerals (MULTIVITAMIN WITH MINERALS) tablet Take 1 tablet by mouth daily. Centrum men 50+   Omega-3 1000 MG CAPS Take 1,000 mg by mouth daily.   OVER THE COUNTER MEDICATION Take 1 tablet by  mouth daily. Beets Chew   pantoprazole (PROTONIX) 40 MG tablet Take 1 tablet (40 mg total) by mouth 2 (two) times daily before a meal. Twice daily for 1 month then may decrease back to once daily.   torsemide (DEMADEX) 20 MG tablet TAKE 1 TABLET BY MOUTH TWICE A DAY   triamcinolone cream (KENALOG) 0.1 % Apply 1 Application topically 2 (two) times daily as needed (itch).   valACYclovir (VALTREX) 1000 MG tablet Take 1,000 mg by mouth at bedtime.   ferrous sulfate 325 (65 FE) MG tablet Take 1 tablet (325 mg total) by mouth daily with breakfast. (Patient taking differently: Take 325 mg by mouth 2 (two) times daily with a meal.)   Current Facility-Administered Medications (Other)  Medication Route   0.9  %  sodium chloride infusion Intravenous   0.9 %  sodium chloride infusion Intravenous   REVIEW OF SYSTEMS: ROS   Positive for: Gastrointestinal, Endocrine, Cardiovascular, Eyes, Respiratory Negative for: Constitutional, Neurological, Skin, Genitourinary, Musculoskeletal, HENT, Psychiatric, Allergic/Imm, Heme/Lymph Last edited by Laddie Aquas, COA on 03/01/2023  1:55 PM.      ALLERGIES Allergies  Allergen Reactions   Azithromycin Nausea Only   Tape Rash and Other (See Comments)    Causes skin redness, Use paper tape only.   PAST MEDICAL HISTORY Past Medical History:  Diagnosis Date   Arthritis    Atrial fibrillation (HCC)    CAD (coronary artery disease)    a. Cath 03/17/15 showing 100% ostial D1, 50% prox LAD to mid LAD, 40% RPDA stenosis. Med rx. // Myoview 01/2020: EF 31 diffuse perfusion defect without reversibility (suspect artifact); reviewed with Dr. Tama High study felt to be low risk   Cataract    Mixed form OD   Chronic diastolic CHF (congestive heart failure) (HCC)    Diabetic retinopathy (HCC)    NPDR OU   Essential hypertension    Glaucoma    POAG OU   History of gout    Hyperlipidemia    Hypertensive retinopathy    OU   Kidney stones    Melanoma of neck (HCC)    NICM (nonischemic cardiomyopathy) (HCC)    OSA on CPAP 2012   Prostate cancer (HCC)    Type II diabetes mellitus (HCC)    Past Surgical History:  Procedure Laterality Date   ABDOMINAL HERNIA REPAIR     w/mesh   ATRIAL FIBRILLATION ABLATION N/A 06/06/2021   Procedure: ATRIAL FIBRILLATION ABLATION;  Surgeon: Hillis Range, MD;  Location: [facility] INVASIVE CV LAB;  Service: Cardiovascular;  Laterality: N/A;   BIOPSY  05/24/2022   Procedure: BIOPSY;  Surgeon: Corbin Ade, MD;  Location: [facility];  Service: Endoscopy;;   BIOPSY  05/30/2022   Procedure: BIOPSY;  Surgeon: Corbin Ade, MD;  Location: [facility];  Service: Endoscopy;;   BIOPSY  01/10/2023   Procedure: BIOPSY;  Surgeon:  Lemar Lofty., MD;  Location: [facility];  Service: Gastroenterology;;   CARDIAC CATHETERIZATION N/A 03/17/2015   Procedure: Left Heart Cath and Coronary Angiography;  Surgeon: Runell Gess, MD;  [facility]VAS[facility] LAB;  Service: Cardiovascular;  Laterality: N/A;   CARDIOVERSION N/A 08/09/2021   Procedure: CARDIOVERSION;  Surgeon: Chilton Si, MD;  Location: [facility] ENDOSCOPY;  Service: Cardiovascular;  Laterality: N/A;   CARDIOVERSION N/A 11/10/2021   Procedure: CARDIOVERSION;  Surgeon: Thomasene Ripple, DO;  Location: [facility] ENDOSCOPY;  Service: Cardiovascular;  Laterality: N/A;   carotid doppler  09/17/2008   rigt and left ICAs 0-49%;mildly  abnormal   CATARACT EXTRACTION Left 2020   Dr. Alben Spittle   COLONOSCOPY N/A 05/16/2017   Procedure: COLONOSCOPY;  Surgeon: Malissa Hippo, MD;  [facility]: [facility];  Service: Endoscopy;  Laterality: N/A;  930   COLONOSCOPY WITH PROPOFOL N/A 05/24/2022   Procedure: COLONOSCOPY WITH PROPOFOL;  Surgeon: Corbin Ade, MD;  [facility]: [facility];  Service: Endoscopy;  Laterality: N/A;   DOPPLER ECHOCARDIOGRAPHY  05/25/2009   EF 50-55%,LA mildly dilated, LV function normal   ELECTROPHYSIOLOGIC STUDY N/A 04/05/2015   Procedure: Cardioversion;  Surgeon: Thurmon Fair, MD;  Location: [facility] LAB;  Service: Cardiovascular;  Laterality: N/A;   ELECTROPHYSIOLOGIC STUDY N/A 09/06/2015   Procedure: Atrial Fibrillation Ablation;  Surgeon: Hillis Range, MD;  Location: [facility] INVASIVE CV LAB;  Service: Cardiovascular;  Laterality: N/A;   ELECTROPHYSIOLOGIC STUDY N/A 07/12/2016   redo afib ablation by Dr Johney Frame   ENTEROSCOPY N/A 05/30/2022   Procedure: ENTEROSCOPY;  Surgeon: Corbin Ade, MD;  [facility]: [facility];  Service: Endoscopy;  Laterality: N/A;   ESOPHAGOGASTRODUODENOSCOPY (EGD) WITH PROPOFOL N/A 05/24/2022   Procedure: ESOPHAGOGASTRODUODENOSCOPY (EGD) WITH PROPOFOL;  Surgeon: Corbin Ade, MD;  [facility];  Service:  Endoscopy;  Laterality: N/A;   ESOPHAGOGASTRODUODENOSCOPY (EGD) WITH PROPOFOL N/A 01/10/2023   Procedure: ESOPHAGOGASTRODUODENOSCOPY (EGD) WITH PROPOFOL;  Surgeon: Meridee Score Netty Starring., MD;  Location: [facility] ENDOSCOPY;  Service: Gastroenterology;  Laterality: N/A;   EUS N/A 01/10/2023   Procedure: UPPER ENDOSCOPIC ULTRASOUND (EUS) RADIAL;  Surgeon: Lemar Lofty., MD;  Location: [facility] ENDOSCOPY;  Service: Gastroenterology;  Laterality: N/A;   EXCISIONAL HEMORRHOIDECTOMY     "inside and out"   EYE SURGERY Left 2020   Cat Sx - Dr. Alben Spittle   FINE NEEDLE ASPIRATION Right    knee; "drew ~ 1 quart off"   GIVENS CAPSULE STUDY N/A 05/25/2022   Procedure: GIVENS CAPSULE STUDY;  Surgeon: Lanelle Bal, DO;  Location: [facility];  Service: Endoscopy;  Laterality: N/A;   GIVENS CAPSULE STUDY N/A 01/23/2023   Procedure: GIVENS CAPSULE STUDY;  Surgeon: Dolores Frame, MD;  [facility]: [facility];  Service: Gastroenterology;  Laterality: N/A;  8:30am   HERNIA REPAIR     HOT HEMOSTASIS N/A 01/10/2023   Procedure: HOT HEMOSTASIS (ARGON PLASMA COAGULATION/BICAP);  Surgeon: Lemar Lofty., MD;  Location: [facility];  Service: Gastroenterology;  Laterality: N/A;   LAPAROSCOPIC CHOLECYSTECTOMY     LEFT ATRIAL APPENDAGE OCCLUSION N/A 06/28/2022   Procedure: LEFT ATRIAL APPENDAGE OCCLUSION;  Surgeon: Tonny Bollman, MD;  Location: [facility] INVASIVE CV LAB;  Service: Cardiovascular;  Laterality: N/A;   MELANOMA EXCISION Right    "neck"   NM MYOCAR PERF WALL MOTION  02/21/2012   EF 61% ,EXERCISE 7 METS. exercise stopped due to wheezing and shortness of breathe   POLYPECTOMY  05/16/2017   Procedure: POLYPECTOMY;  Surgeon: Malissa Hippo, MD;  [facility];  Service: Endoscopy;;  colon   POLYPECTOMY  05/24/2022   Procedure: POLYPECTOMY;  Surgeon: Corbin Ade, MD;  [facility];  Service: Endoscopy;;   POLYPECTOMY  01/10/2023   Procedure: POLYPECTOMY;  Surgeon:  Meridee Score Netty Starring., MD;  Location: [facility];  Service: Gastroenterology;;   PROSTATECTOMY     SHOULDER OPEN ROTATOR CUFF REPAIR Right X 2   SUBMUCOSAL TATTOO INJECTION  05/30/2022   Procedure: SUBMUCOSAL TATTOO INJECTION;  Surgeon: Corbin Ade, MD;  Location: [facility];  Service: Endoscopy;;   SUBMUCOSAL TATTOO INJECTION  01/10/2023   Procedure: SUBMUCOSAL TATTOO INJECTION;  Surgeon: Lemar Lofty., MD;  Location: [facility];  Service: Gastroenterology;;   TEE WITHOUT CARDIOVERSION N/A 09/05/2015   Procedure: TRANSESOPHAGEAL ECHOCARDIOGRAM (TEE);  Surgeon: Quintella Reichert, MD;  Location: [facility] ENDOSCOPY;  Service: Cardiovascular;  Laterality: N/A;   TEE WITHOUT CARDIOVERSION N/A 06/28/2022   Procedure: TRANSESOPHAGEAL ECHOCARDIOGRAM (TEE);  Surgeon: Tonny Bollman, MD;  Location: [facility] INVASIVE CV LAB;  Service: Cardiovascular;  Laterality: N/A;   FAMILY HISTORY Family History  Problem Relation Age of Onset   Heart disease Mother    Lung cancer Mother    Heart attack Mother 74   Diabetes Father    Heart disease Father    Stroke Brother    Healthy Daughter    Colon cancer Maternal Aunt        58s   SOCIAL HISTORY Social History   Tobacco Use   Smoking status: Former    Packs/day: 2.00    Years: 27.00    Additional pack years: 0.00    Total pack years: 54.00    Types: Cigarettes    Quit date: 1992    Years since quitting: 32.3    Passive exposure: Current   Smokeless tobacco: Never   Tobacco comments:    Former smoker 09/27/21  Vaping Use   Vaping Use: Never used  Substance Use Topics   Alcohol use: No    Alcohol/week: 0.0 standard drinks of alcohol    Comment: "used to drink; stopped ~ 2008"   Drug use: No       OPHTHALMIC EXAM: Base Eye Exam     Visual Acuity (Snellen - Linear)       Right Left   Dist cc 20/25 +2 20/25   Dist ph cc NI NI    Correction: Glasses         Tonometry (Tonopen, 1:53 PM)       Right Left   Pressure 20 15          Pupils       Dark Light Shape React APD   Right 2 1 Round Brisk None   Left 2 1 Round Brisk None         Visual Fields (Counting fingers)       Left Right    Full Full         Extraocular Movement       Right Left    Full, Ortho Full, Ortho         Neuro/Psych     Oriented x3: Yes   Mood/Affect: Normal         Dilation     Both eyes: 1.0% Mydriacyl, 2.5% Phenylephrine @ 1:52 PM           Slit Lamp and Fundus Exam     Slit Lamp Exam       Right Left   Lids/Lashes Dermato, mild MGD Dermato, mild MGD   Conjunctiva/Sclera Temporal pinguecula, mild inferior sub conj heme Temporal pinguecula   Cornea EBMD, mild haze, trace PEE, mild Debris in tear film, well healed cataract wound trace haze, trace PEE, well healed temporal cataract wounds, arcus   Anterior Chamber Deep and clear; narrow temporal angle Deep and clear   Iris Round and dilated, mild anterior bowing, No NVI Round and moderately dilated to 5.39mm   Lens PCIOL in good position, trace PCO PC IOL in good position with open PC   Anterior Vitreous Synerisis Synerisis  Fundus Exam       Right Left   Disc Mild pallor, sharp rim, +cupping, thin inferior rim Mild pallor, sharp rim, +cupping, +PPA   C/D Ratio 0.7 0.6   Macula Flat, blunted foveal reflex, cystic changes - slightly improved, RPE mottling and clumping, no heme, Drusen, punctate Exudates -- slightly improved Flat, blunted foveal reflex, no heme, cystic changes -- resolved   Vessels mild attenuation, mild tortuosity Attenuated, mild av crossing changes, mild tortuousity   Periphery Attached, rare MA, forcal DBH nasal to disc--improved Attached. No heme.           Refraction     Wearing Rx       Sphere Cylinder Axis Add   Right -0.25 +2.00 158 +2.50   Left -1.00 +1.25 008 +2.50           IMAGING AND PROCEDURES  Imaging and Procedures for 03/01/2023  OCT, Retina - OU - Both Eyes       Right Eye Quality  was good. Central Foveal Thickness: 340. Progression has improved. Findings include no SRF, abnormal foveal contour, intraretinal fluid, vitreomacular adhesion (Mild interval improvement in IRF/IRHM and foveal contour, partial PVD).   Left Eye Quality was good. Central Foveal Thickness: 275. Progression has improved. Findings include normal foveal contour, no IRF, no SRF, intraretinal hyper-reflective material (stable improvement in IRF/cystic changes centrally; interval improvement in trace IRHM; partial PVD).   Notes *Images captured and stored on drive  Diagnosis / Impression:  +DME OU OD: Mild interval improvement in IRF/IRHM and foveal contour, partial PVD OS: stable improvement in IRF/cystic changes centrally; interval improvement in trace IRHM; partial PVD  Clinical management:  See below  Abbreviations: NFP - Normal foveal profile. CME - cystoid macular edema. PED - pigment epithelial detachment. IRF - intraretinal fluid. SRF - subretinal fluid. EZ - ellipsoid zone. ERM - epiretinal membrane. ORA - outer retinal atrophy. ORT - outer retinal tubulation. SRHM - subretinal hyper-reflective material. IRHM - intraretinal hyper-reflective material     Intravitreal Injection, Pharmacologic Agent - OD - Right Eye       Time Out 03/01/2023. 2:31 PM. Confirmed correct patient, procedure, site, and patient consented.   Anesthesia Topical anesthesia was used. Anesthetic medications included Lidocaine 2%, Proparacaine 0.5%.   Procedure Preparation included 5% betadine to ocular surface, eyelid speculum. A (32g) needle was used.   Injection: 6 mg faricimab-svoa 6 MG/0.05ML   Route: Intravitreal, Site: Right Eye   NDC: O8010301, Lot: [id number], Expiration date: 01/12/2025, Waste: 0 mL   Post-op Post injection exam found visual acuity of at least counting fingers. The patient tolerated the procedure well. There were no complications. The patient received written and verbal post  procedure care education.      Intravitreal Injection, Pharmacologic Agent - OS - Left Eye       Time Out 03/01/2023. 2:32 PM. Confirmed correct patient, procedure, site, and patient consented.   Anesthesia Topical anesthesia was used. Anesthetic medications included Lidocaine 2%, Proparacaine 0.5%.   Procedure Preparation included 5% betadine to ocular surface, eyelid speculum. A (32g) needle was used.   Injection: 6 mg faricimab-svoa 6 MG/0.05ML   Route: Intravitreal, Site: Left Eye   NDC: O8010301, Lot: [id number], Expiration date: 01/12/2025, Waste: 0 mL   Post-op Post injection exam found visual acuity of at least counting fingers. The patient tolerated the procedure well. There were no complications. The patient received written and verbal post procedure care education. Post injection medications were not given.  ASSESSMENT/PLAN:   ICD-10-CM   1. 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 Current use of insulin (HCC)  Z79.4     3. Long term (current) use of oral hypoglycemic drugs  Z79.84     4. Essential hypertension  I10     5. Hypertensive retinopathy of both eyes  H35.033     6. Pseudophakia of both eyes  Z96.1     7. 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 History of herpes zoster of eye  Z86.19     9. Primary open angle glaucoma of both eyes, unspecified glaucoma stage  H40.1130      1-3. 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 Single focal MA superior to fovea; OS: Perifoveal Mas w/ late  leakage - s/p IVA OS # 1(07.26.21), #2 (08.24.21), #3 (09.21.21), #4 (10.22.21) -- IVA resistance ============================================================= - s/p IVE OD #1 (09.29.23), #2 (10.30.23), #3 (SAMPLE) (11.27.23) #4 (12.29.23) #5 (01.26.24) #6 (02.23.24) -- IVE resistance - s/p IVE OS #1 (11.22.21), #2 (12.21.21), #3 (01.20.22), #4 (02.18.22), #5 (03.18.22), #6 (04.15.22), #7 (05.10.22) -- sample, #8 (06.08.22), #9 (07.12.22), #10 (08.12.22), #11 (09.16.22), #12 (10.21.22), #13 (11.18.22), #14 (12.21.22), #15 (01.20.23), #16 (02.17.23), #17 (03.24.23), #18 (04.21.23), #19 (05.26.23), #20 (06.23.23) -- IVE resistance ============================================================= - s/p IVV OD #1 (03.22.24), #2 (04.19.24) - s/p IVV OS #1 (07.21.23), #2 (08.25.23), #3 (09.29.23), #4 (10.30.23), #5 (11.27.23) #6 (12.29.23), #7 (01.26.23), #8 (02.23.24), #9 (03.22.24), #10(04.19.24) - BCVA 20/25 OU - OCT shows OD: mild interval improvement in IRF/IRHM and foveal contour, partial PVD; [id number] improvement in IRF/cystic changes centrally; interval improvement in trace IRHM; partial PVD at 4 wks - recommend IVV OU (OD #3 and OS #11) today, 05.17.24 with f/u in 4 weeks  - pt wishes to proceed with injections  - RBA of procedure discussed, questions answered  - Vabysmo informed consent obtained and signed, 03.22.24 (OD)  - Vabysmo informed consent obtained and signed, 07.21.23 (OS) - IVA informed consent obtained and signed, 07.26.21 (OS) - IVE informed consent obtained and signed, 04.21.23 (OS) - IVE informed consent obtained and signed, 09.29.23 (OD) - Eylea approved until 10/04/23 - [employer] approved Vabysmo - see procedure note - f/u 4 weeks -- DFE/OCT, possible injections  4,5. Hypertensive retinopathy OU - discussed importance of tight BP control - continue to monitor  6. Pseudophakia OU  - s/p CE/IOL OS (2020, Dr. Alben Spittle)             - s/p CE/IOL OD (2022, Dr. Alben Spittle)  - s/p YAG  cap OS (04.04.23) - BCVA 20/30 OD and 20/25 OS from 20/150  - IOLs in good position, doing well  - continue to monitor  7. 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 Valere Dross in August 2022  - s/p SuperK OS on 12.12.2022  8. History of herpes zoster/iritis OS  - on valtrex 1 g daily---maintenance  9. POAG OU  - was under the expert management of Dr. Alben Spittle, now follows with Dr. Zenaida Niece  - IOP 20, 15  -  continue Cosopt bid OU  Ophthalmic Meds Ordered this visit:  Meds ordered this encounter  Medications   faricimab-svoa (VABYSMO) 6mg /0.1mL intravitreal injection   faricimab-svoa (VABYSMO) 6mg /0.35mL intravitreal injection     This document serves as a record of services personally performed by Karie Chimera, MD, PhD. It was created on their behalf by Berlin Hun COT, an ophthalmic technician. The creation of this record is the provider's dictation and/or activities during the visit.    Electronically signed by: Berlin Hun COT 5.14.20243:16 PM  This document serves as a record of services personally performed by Karie Chimera, MD, PhD. It was created on their behalf by Gerilyn Nestle, COT an ophthalmic technician. The creation of this record is the provider's dictation and/or activities during the visit.    Electronically signed by:  Gerilyn Nestle, COT 05.17.24 3:16 PM  Karie Chimera, M.D., Ph.D. Diseases & Surgery of the Retina and Vitreous [facility] 03/01/2023   I have reviewed the above documentation for accuracy and completeness, and I agree with the above. Karie Chimera, M.D., Ph.D. 03/01/23 3:20 PM   Abbreviations: M myopia (nearsighted); A astigmatism; H hyperopia (farsighted); P presbyopia; Mrx spectacle prescription;  CTL contact lenses; OD right eye; OS left eye; OU both eyes  XT exotropia; ET esotropia; PEK punctate epithelial keratitis; PEE punctate epithelial erosions; DES dry eye syndrome; MGD meibomian  gland dysfunction; ATs artificial tears; PFAT's preservative free artificial tears; NSC nuclear sclerotic cataract; PSC posterior subcapsular cataract; ERM epi-retinal membrane; PVD posterior vitreous detachment; RD retinal detachment; DM diabetes mellitus; DR diabetic retinopathy; NPDR non-proliferative diabetic retinopathy; PDR proliferative diabetic retinopathy; CSME clinically significant macular edema; DME diabetic macular edema; dbh dot blot hemorrhages; CWS cotton wool spot; POAG primary open angle glaucoma; C/D cup-to-disc ratio; HVF humphrey visual field; GVF goldmann visual field; OCT optical coherence tomography; IOP intraocular pressure; BRVO Branch retinal vein occlusion; CRVO central retinal vein occlusion; CRAO central retinal artery occlusion; BRAO branch retinal artery occlusion; RT retinal tear; SB scleral buckle; PPV pars plana vitrectomy; VH Vitreous hemorrhage; PRP panretinal laser photocoagulation; IVK intravitreal kenalog; VMT vitreomacular traction; MH Macular hole;  NVD neovascularization of the disc; NVE neovascularization elsewhere; AREDS age related eye disease study; ARMD age related macular degeneration; POAG primary open angle glaucoma; EBMD epithelial/anterior basement membrane dystrophy; ACIOL anterior chamber intraocular lens; IOL intraocular lens; PCIOL posterior chamber intraocular lens; Phaco/IOL phacoemulsification with intraocular lens placement; PRK photorefractive keratectomy; LASIK laser assisted in situ keratomileusis; HTN hypertension; DM diabetes mellitus; COPD chronic obstructive pulmonary disease

## 2023-03-01 ENCOUNTER — Ambulatory Visit (INDEPENDENT_AMBULATORY_CARE_PROVIDER_SITE_OTHER): Payer: Medicare Other | Admitting: Ophthalmology

## 2023-03-01 ENCOUNTER — Encounter (INDEPENDENT_AMBULATORY_CARE_PROVIDER_SITE_OTHER): Payer: Self-pay | Admitting: Ophthalmology

## 2023-03-01 DIAGNOSIS — E113213 Type 2 diabetes mellitus with mild nonproliferative diabetic retinopathy with macular edema, bilateral: Secondary | ICD-10-CM | POA: Diagnosis not present

## 2023-03-01 DIAGNOSIS — Z8619 Personal history of other infectious and parasitic diseases: Secondary | ICD-10-CM

## 2023-03-01 DIAGNOSIS — Z7984 Long term (current) use of oral hypoglycemic drugs: Secondary | ICD-10-CM

## 2023-03-01 DIAGNOSIS — I10 Essential (primary) hypertension: Secondary | ICD-10-CM | POA: Diagnosis not present

## 2023-03-01 DIAGNOSIS — H35033 Hypertensive retinopathy, bilateral: Secondary | ICD-10-CM

## 2023-03-01 DIAGNOSIS — H401130 Primary open-angle glaucoma, bilateral, stage unspecified: Secondary | ICD-10-CM

## 2023-03-01 DIAGNOSIS — H18523 Epithelial (juvenile) corneal dystrophy, bilateral: Secondary | ICD-10-CM

## 2023-03-01 DIAGNOSIS — Z794 Long term (current) use of insulin: Secondary | ICD-10-CM | POA: Diagnosis not present

## 2023-03-01 DIAGNOSIS — Z961 Presence of intraocular lens: Secondary | ICD-10-CM

## 2023-03-01 MED ORDER — FARICIMAB-SVOA 6 MG/0.05ML IZ SOLN
6.0000 mg | INTRAVITREAL | Status: AC | PRN
  Administered 2023-03-01: 6 mg via INTRAVITREAL

## 2023-03-27 NOTE — Progress Notes (Signed)
[facility] - Clinic Note  04/02/2023    CHIEF COMPLAINT Patient presents for Retina Follow Up  HISTORY OF PRESENT ILLNESS: Lucas Dudley is a 82 y.o. male who presents to the clinic today for:  HPI     Retina Follow Up   Patient presents with  Diabetic Retinopathy.  In both eyes.  This started 4 weeks ago.  I, the attending physician,  performed the HPI with the patient and updated documentation appropriately.        Comments   Patient here for 4 weeks retina follow up for NPDR OU. Patient states vision doing pretty good. No eye pain.       Last edited by Rennis Chris, MD on 04/02/2023  5:24 PM.      Referring physician: Gareth Morgan, MD [address],  [place] [address]  HISTORICAL INFORMATION:   Selected notes from the MEDICAL RECORD NUMBER Referred by Dr. Alben Spittle for DEE   CURRENT MEDICATIONS: Current Outpatient Medications (Ophthalmic Drugs)  Medication Sig   Artificial Tear Ointment (DRY EYES OP) Place 1 drop into both eyes daily as needed (Dry eye).   COMBIGAN 0.2-0.5 % ophthalmic solution Place 1 drop into both eyes 2 (two) times daily.   No current facility-administered medications for this visit. (Ophthalmic Drugs)   Current Outpatient Medications (Other)  Medication Sig   acetaminophen (TYLENOL) 650 MG CR tablet Take 1,300 mg by mouth every 8 (eight) hours as needed for pain.   albuterol (VENTOLIN HFA) 108 (90 Base) MCG/ACT inhaler Inhale 2 puffs into the lungs every 4 (four) hours as needed for wheezing or shortness of breath.   amiodarone (PACERONE) 200 MG tablet TAKE 1 TABLET BY MOUTH EVERY DAY   Ascorbic Acid (VITAMIN C PO) Take 500 mg by mouth daily.   aspirin 81 MG chewable tablet Chew 81 mg by mouth daily.   camphor-menthol (ANTI-ITCH) lotion Apply 1 Application topically daily as needed for itching.   carvedilol (COREG) 3.125 MG tablet TAKE 1 TABLET BY MOUTH 2 TIMES DAILY.   CRANBERRY PO Take 1 tablet by mouth daily.    cyanocobalamin (VITAMIN B12) 1000 MCG tablet Take 1 tablet (1,000 mcg total) by mouth daily.   docusate sodium (COLACE) 100 MG capsule Take 1 capsule (100 mg total) by mouth daily.   ELDERBERRY PO Take 2 tablets by mouth daily. Chewable   gemfibrozil (LOPID) 600 MG tablet Take 600 mg by mouth at bedtime.    glimepiride (AMARYL) 2 MG tablet Take 2 mg by mouth daily in the afternoon.   Insulin Glargine (BASAGLAR KWIKPEN) 100 UNIT/ML Inject 34 Units into the skin at bedtime.   KLOR-CON M20 20 MEQ tablet TAKE 1 TABLET BY MOUTH EVERY DAY   lisinopril (ZESTRIL) 20 MG tablet TAKE 1 TABLET BY MOUTH EVERY DAY   magnesium gluconate (MAGONATE) 500 MG tablet Take 500 mg by mouth daily.   metFORMIN (GLUCOPHAGE-XR) 500 MG 24 hr tablet Take 1,000 mg by mouth in the morning and at bedtime.   mineral oil liquid Take 15 mLs by mouth daily as needed for moderate constipation.   Multiple Vitamins-Minerals (MULTIVITAMIN WITH MINERALS) tablet Take 1 tablet by mouth daily. Centrum men 50+   Omega-3 1000 MG CAPS Take 1,000 mg by mouth daily.   OVER THE COUNTER MEDICATION Take 1 tablet by mouth daily. Beets Chew   pantoprazole (PROTONIX) 40 MG tablet Take 1 tablet (40 mg total) by mouth 2 (two) times daily before a meal.  Twice daily for 1 month then may decrease back to once daily.   torsemide (DEMADEX) 20 MG tablet TAKE 1 TABLET BY MOUTH TWICE A DAY   triamcinolone cream (KENALOG) 0.1 % Apply 1 Application topically 2 (two) times daily as needed (itch).   valACYclovir (VALTREX) 1000 MG tablet Take 1,000 mg by mouth at bedtime.   ferrous sulfate 325 (65 FE) MG tablet Take 1 tablet (325 mg total) by mouth daily with breakfast. (Patient taking differently: Take 325 mg by mouth 2 (two) times daily with a meal.)   Current Facility-Administered Medications (Other)  Medication Route   0.9 %  sodium chloride infusion Intravenous   0.9 %  sodium chloride infusion Intravenous   REVIEW OF SYSTEMS: ROS   Positive for:  Gastrointestinal, Endocrine, Cardiovascular, Eyes, Respiratory Negative for: Constitutional, Neurological, Skin, Genitourinary, Musculoskeletal, HENT, Psychiatric, Allergic/Imm, Heme/Lymph Last edited by Laddie Aquas, COA on 04/02/2023  2:58 PM.       ALLERGIES Allergies  Allergen Reactions   Azithromycin Nausea Only   Tape Rash and Other (See Comments)    Causes skin redness, Use paper tape only.   PAST MEDICAL HISTORY Past Medical History:  Diagnosis Date   Arthritis    Atrial fibrillation (HCC)    CAD (coronary artery disease)    a. Cath 03/17/15 showing 100% ostial D1, 50% prox LAD to mid LAD, 40% RPDA stenosis. Med rx. // Myoview 01/2020: EF 31 diffuse perfusion defect without reversibility (suspect artifact); reviewed with Dr. Tama High study felt to be low risk   Cataract    Mixed form OD   Chronic diastolic CHF (congestive heart failure) (HCC)    Diabetic retinopathy (HCC)    NPDR OU   Essential hypertension    Glaucoma    POAG OU   History of gout    Hyperlipidemia    Hypertensive retinopathy    OU   Kidney stones    Melanoma of neck (HCC)    NICM (nonischemic cardiomyopathy) (HCC)    OSA on CPAP 2012   Prostate cancer (HCC)    Type II diabetes mellitus (HCC)    Past Surgical History:  Procedure Laterality Date   ABDOMINAL HERNIA REPAIR     w/mesh   ATRIAL FIBRILLATION ABLATION N/A 06/06/2021   Procedure: ATRIAL FIBRILLATION ABLATION;  Surgeon: Hillis Range, MD;  Location: [facility] INVASIVE CV LAB;  Service: Cardiovascular;  Laterality: N/A;   BIOPSY  05/24/2022   Procedure: BIOPSY;  Surgeon: Corbin Ade, MD;  Location: [facility];  Service: Endoscopy;;   BIOPSY  05/30/2022   Procedure: BIOPSY;  Surgeon: Corbin Ade, MD;  Location: [facility];  Service: Endoscopy;;   BIOPSY  01/10/2023   Procedure: BIOPSY;  Surgeon: Lemar Lofty., MD;  Location: [facility];  Service: Gastroenterology;;   CARDIAC CATHETERIZATION N/A 03/17/2015    Procedure: Left Heart Cath and Coronary Angiography;  Surgeon: Runell Gess, MD;  Location: [facility] INVASIVE CV LAB;  Service: Cardiovascular;  Laterality: N/A;   CARDIOVERSION N/A 08/09/2021   Procedure: CARDIOVERSION;  Surgeon: Chilton Si, MD;  Location: [facility] ENDOSCOPY;  Service: Cardiovascular;  Laterality: N/A;   CARDIOVERSION N/A 11/10/2021   Procedure: CARDIOVERSION;  Surgeon: Thomasene Ripple, DO;  Location: [facility] ENDOSCOPY;  Service: Cardiovascular;  Laterality: N/A;   carotid doppler  09/17/2008   rigt and left ICAs 0-49%;mildly  abnormal   CATARACT EXTRACTION Left 2020   Dr. Alben Spittle   COLONOSCOPY N/A 05/16/2017   Procedure: COLONOSCOPY;  Surgeon: Lionel December  U, MD;  Location: [facility];  Service: Endoscopy;  Laterality: N/A;  930   COLONOSCOPY WITH PROPOFOL N/A 05/24/2022   Procedure: COLONOSCOPY WITH PROPOFOL;  Surgeon: Corbin Ade, MD;  Location: [facility];  Service: Endoscopy;  Laterality: N/A;   DOPPLER ECHOCARDIOGRAPHY  05/25/2009   EF 50-55%,LA mildly dilated, LV function normal   ELECTROPHYSIOLOGIC STUDY N/A 04/05/2015   Procedure: Cardioversion;  Surgeon: Thurmon Fair, MD;  Location: [facility] CV LAB;  Service: Cardiovascular;  Laterality: N/A;   ELECTROPHYSIOLOGIC STUDY N/A 09/06/2015   Procedure: Atrial Fibrillation Ablation;  Surgeon: Hillis Range, MD;  Location: [facility] INVASIVE CV LAB;  Service: Cardiovascular;  Laterality: N/A;   ELECTROPHYSIOLOGIC STUDY N/A 07/12/2016   redo afib ablation by Dr Johney Frame   ENTEROSCOPY N/A 05/30/2022   Procedure: ENTEROSCOPY;  Surgeon: Corbin Ade, MD;  [facility]: [facility];  Service: Endoscopy;  Laterality: N/A;   ESOPHAGOGASTRODUODENOSCOPY (EGD) WITH PROPOFOL N/A 05/24/2022   Procedure: ESOPHAGOGASTRODUODENOSCOPY (EGD) WITH PROPOFOL;  Surgeon: Corbin Ade, MD;  [facility];  Service: Endoscopy;  Laterality: N/A;   ESOPHAGOGASTRODUODENOSCOPY (EGD) WITH PROPOFOL N/A 01/10/2023   Procedure:  ESOPHAGOGASTRODUODENOSCOPY (EGD) WITH PROPOFOL;  Surgeon: Meridee Score Netty Starring., MD;  Location: [facility] ENDOSCOPY;  Service: Gastroenterology;  Laterality: N/A;   EUS N/A 01/10/2023   Procedure: UPPER ENDOSCOPIC ULTRASOUND (EUS) RADIAL;  Surgeon: Lemar Lofty., MD;  Location: [facility] ENDOSCOPY;  Service: Gastroenterology;  Laterality: N/A;   EXCISIONAL HEMORRHOIDECTOMY     "inside and out"   EYE SURGERY Left 2020   Cat Sx - Dr. Alben Spittle   FINE NEEDLE ASPIRATION Right    knee; "drew ~ 1 quart off"   GIVENS CAPSULE STUDY N/A 05/25/2022   Procedure: GIVENS CAPSULE STUDY;  Surgeon: Lanelle Bal, DO;  Location: [facility];  Service: Endoscopy;  Laterality: N/A;   GIVENS CAPSULE STUDY N/A 01/23/2023   Procedure: GIVENS CAPSULE STUDY;  Surgeon: Dolores Frame, MD;  Location: [facility];  Service: Gastroenterology;  Laterality: N/A;  8:30am   HERNIA REPAIR     HOT HEMOSTASIS N/A 01/10/2023   Procedure: HOT HEMOSTASIS (ARGON PLASMA COAGULATION/BICAP);  Surgeon: Lemar Lofty., MD;  Location: [facility];  Service: Gastroenterology;  Laterality: N/A;   LAPAROSCOPIC CHOLECYSTECTOMY     LEFT ATRIAL APPENDAGE OCCLUSION N/A 06/28/2022   Procedure: LEFT ATRIAL APPENDAGE OCCLUSION;  Surgeon: Tonny Bollman, MD;  Location: [facility] INVASIVE CV LAB;  Service: Cardiovascular;  Laterality: N/A;   MELANOMA EXCISION Right    "neck"   NM MYOCAR PERF WALL MOTION  02/21/2012   EF 61% ,EXERCISE 7 METS. exercise stopped due to wheezing and shortness of breathe   POLYPECTOMY  05/16/2017   Procedure: POLYPECTOMY;  Surgeon: Malissa Hippo, MD;  [facility];  Service: Endoscopy;;  colon   POLYPECTOMY  05/24/2022   Procedure: POLYPECTOMY;  Surgeon: Corbin Ade, MD;  [facility];  Service: Endoscopy;;   POLYPECTOMY  01/10/2023   Procedure: POLYPECTOMY;  Surgeon: Meridee Score Netty Starring., MD;  Location: [facility];  Service: Gastroenterology;;   PROSTATECTOMY     SHOULDER  OPEN ROTATOR CUFF REPAIR Right X 2   SUBMUCOSAL TATTOO INJECTION  05/30/2022   Procedure: SUBMUCOSAL TATTOO INJECTION;  Surgeon: Corbin Ade, MD;  Location: [facility];  Service: Endoscopy;;   SUBMUCOSAL TATTOO INJECTION  01/10/2023   Procedure: SUBMUCOSAL TATTOO INJECTION;  Surgeon: Lemar Lofty., MD;  Location: [facility];  Service: Gastroenterology;;   TEE  WITHOUT CARDIOVERSION N/A 09/05/2015   Procedure: TRANSESOPHAGEAL ECHOCARDIOGRAM (TEE);  Surgeon: Quintella Reichert, MD;  Location: [facility] ENDOSCOPY;  Service: Cardiovascular;  Laterality: N/A;   TEE WITHOUT CARDIOVERSION N/A 06/28/2022   Procedure: TRANSESOPHAGEAL ECHOCARDIOGRAM (TEE);  Surgeon: Tonny Bollman, MD;  Location: [facility] INVASIVE CV LAB;  Service: Cardiovascular;  Laterality: N/A;   FAMILY HISTORY Family History  Problem Relation Age of Onset   Heart disease Mother    Lung cancer Mother    Heart attack Mother 11   Diabetes Father    Heart disease Father    Stroke Brother    Healthy Daughter    Colon cancer Maternal Aunt        85s   SOCIAL HISTORY Social History   Tobacco Use   Smoking status: Former    Packs/day: 2.00    Years: 27.00    Additional pack years: 0.00    Total pack years: 54.00    Types: Cigarettes    Quit date: 1992    Years since quitting: 32.4    Passive exposure: Current   Smokeless tobacco: Never   Tobacco comments:    Former smoker 09/27/21  Vaping Use   Vaping Use: Never used  Substance Use Topics   Alcohol use: No    Alcohol/week: 0.0 standard drinks of alcohol    Comment: "used to drink; stopped ~ 2008"   Drug use: No       OPHTHALMIC EXAM: Base Eye Exam     Visual Acuity (Snellen - Linear)       Right Left   Dist cc 20/30 20/20   Dist ph cc 20/20 -1     Correction: Glasses         Tonometry (Tonopen, 2:56 PM)       Right Left   Pressure 18 20         Pupils       Dark Light Shape React APD   Right 2 1 Round Brisk None   Left 2 1 Round Brisk None          Visual Fields (Counting fingers)       Left Right    Full Full         Extraocular Movement       Right Left    Full, Ortho Full, Ortho         Neuro/Psych     Oriented x3: Yes   Mood/Affect: Normal         Dilation     Both eyes: 1.0% Mydriacyl, 2.5% Phenylephrine @ 2:56 PM           Slit Lamp and Fundus Exam     Slit Lamp Exam       Right Left   Lids/Lashes Dermato, mild MGD Dermato, mild MGD   Conjunctiva/Sclera Temporal pinguecula, mild inferior sub conj heme Temporal pinguecula   Cornea EBMD, mild haze, trace PEE, mild Debris in tear film, well healed cataract wound trace haze, trace PEE, well healed temporal cataract wounds, arcus   Anterior Chamber Deep and clear; narrow temporal angle Deep and clear   Iris Round and dilated, mild anterior bowing, No NVI Round and moderately dilated to 5.36mm   Lens PCIOL in good position, trace PCO PC IOL in good position with open PC   Anterior Vitreous Synerisis Synerisis         Fundus Exam       Right Left   Disc Mild pallor, sharp rim, +cupping, thin  inferior rim Mild pallor, sharp rim, +cupping, +PPA   C/D Ratio 0.7 0.6   Macula Flat, blunted foveal reflex, cystic changes - slightly improved, RPE mottling and clumping, no heme, Drusen, punctate Exudates -- slightly improved Flat, blunted foveal reflex, no heme, cystic changes -- stably improved   Vessels mild attenuation, mild tortuosity Attenuated, mild av crossing changes, mild tortuousity   Periphery Attached, rare MA, forcal DBH nasal to disc--improved Attached. No heme.           Refraction     Wearing Rx       Sphere Cylinder Axis Add   Right -0.25 +2.00 158 +2.50   Left -1.00 +1.25 008 +2.50           IMAGING AND PROCEDURES  Imaging and Procedures for 04/02/2023  OCT, Retina - OU - Both Eyes       Right Eye Quality was good. Central Foveal Thickness: 346. Progression has improved. Findings include no SRF, abnormal  foveal contour, intraretinal fluid, vitreomacular adhesion (Mild interval improvement in IRF/IRHM and foveal contour, partial PVD).   Left Eye Quality was good. Central Foveal Thickness: 272. Progression has been stable. Findings include normal foveal contour, no IRF, no SRF, intraretinal hyper-reflective material (stable improvement in IRF/cystic changes centrally; interval improvement in trace IRHM; partial PVD).   Notes *Images captured and stored on drive  Diagnosis / Impression:  +DME OU OD: Mild interval improvement in IRF/IRHM and foveal contour, partial PVD OS: stable improvement in IRF/cystic changes centrally; interval improvement in trace IRHM; partial PVD  Clinical management:  See below  Abbreviations: NFP - Normal foveal profile. CME - cystoid macular edema. PED - pigment epithelial detachment. IRF - intraretinal fluid. SRF - subretinal fluid. EZ - ellipsoid zone. ERM - epiretinal membrane. ORA - outer retinal atrophy. ORT - outer retinal tubulation. SRHM - subretinal hyper-reflective material. IRHM - intraretinal hyper-reflective material     Intravitreal Injection, Pharmacologic Agent - OD - Right Eye       Time Out 04/02/2023. 3:22 PM. Confirmed correct patient, procedure, site, and patient consented.   Anesthesia Topical anesthesia was used. Anesthetic medications included Lidocaine 2%, Proparacaine 0.5%.   Procedure Preparation included 5% betadine to ocular surface, eyelid speculum. A (32g) needle was used.   Injection: 6 mg faricimab-svoa 6 MG/0.05ML   Route: Intravitreal, Site: Right Eye   NDC: [phone], Lot: [id number], Expiration date: 02/11/2025, Waste: 0 mL   Post-op Post injection exam found visual acuity of at least counting fingers. The patient tolerated the procedure well. There were no complications. The patient received written and verbal post procedure care education.            ASSESSMENT/PLAN:   ICD-10-CM   1. Both eyes affected by  mild nonproliferative diabetic retinopathy with macular edema, associated with type 2 diabetes mellitus (HCC)  E11.3213 OCT, Retina - OU - Both Eyes    Intravitreal Injection, Pharmacologic Agent - OD - Right Eye    faricimab-svoa (VABYSMO) 6mg /0.53mL intravitreal injection    CANCELED: Intravitreal Injection, Pharmacologic Agent - OS - Left Eye    2. Current use of insulin (HCC)  Z79.4     3. Long term (current) use of oral hypoglycemic drugs  Z79.84     4. Essential hypertension  I10     5. Hypertensive retinopathy of both eyes  H35.033     6. Pseudophakia of both eyes  Z96.1     7. 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 History of herpes zoster of eye  Z86.19     9. Primary open angle glaucoma of both eyes, unspecified glaucoma stage  H40.1130      1-3. 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 Single focal MA superior to fovea; OS: Perifoveal Mas w/ late leakage - s/p IVA OS # 1(07.26.21), #2 (08.24.21), #3 (09.21.21), #4 (10.22.21) -- IVA resistance ============================================================= - s/p IVE OD #1 (09.29.23), #2 (10.30.23), #3 (SAMPLE) (11.27.23) #4 (12.29.23) #5 (01.26.24) #6 (02.23.24) -- IVE resistance - s/p IVE OS #1 (11.22.21), #2 (12.21.21), #3 (01.20.22), #4 (02.18.22), #5 (03.18.22), #6 (04.15.22), #7 (05.10.22) -- sample, #8 (06.08.22), #9 (07.12.22), #10 (08.12.22), #11 (09.16.22), #12 (10.21.22), #13 (11.18.22), #14 (12.21.22), #15 (01.20.23), #16 (02.17.23), #17 (03.24.23), #18 (04.21.23), #19 (05.26.23), #20 (06.23.23) -- IVE resistance ============================================================= - s/p IVV OD #1 (03.22.24), #2 (04.19.24), #3 (5.17.24) - s/p IVV OS #1 (07.21.23), #2 (08.25.23), #3 (09.29.23), #4 (10.30.23), #5 (11.27.23) #6 (12.29.23), #7 (01.26.23), #8 (02.23.24), #9 (03.22.24), #10(04.19.24), #11 (5.17.24) - BCVA 20/20 OU! - OCT shows OD: mild  interval improvement in IRF/IRHM and foveal contour, partial PVD; LK:GMWNUU improvement in IRF/cystic changes centrally; interval improvement in trace IRHM; partial PVD at 4 wks - recommend IVV OD #4  today, 06.18.24 with ext f/u to 5 weeks - recommend holding injection OS today -- pt in agreement  - pt wishes to proceed with injection OD  - RBA of procedure discussed, questions answered  - Vabysmo informed consent obtained and signed, 03.22.24 (OD)  - Vabysmo informed consent obtained and signed, 07.21.23 (OS) - IVA informed consent obtained and signed, 07.26.21 (OS) - IVE informed consent obtained and signed, 04.21.23 (OS) - IVE informed consent obtained and signed, 09.29.23 (OD) - Eylea approved until 10/04/23 - [employer] approved Vabysmo - see procedure note - f/u 5 weeks -- DFE/OCT, possible injection(s)  4,5. Hypertensive retinopathy OU - discussed importance of tight BP control - continue to monitor  6. Pseudophakia OU  - s/p CE/IOL OS (2020, Dr. Alben Spittle)             - s/p CE/IOL OD (2022, Dr. Alben Spittle)  - s/p YAG cap OS (04.04.23) - BCVA 20/30 OD and 20/25 OS from 20/150  - IOLs in good position, doing well  - continue to monitor  7. 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 Valere Dross in August 2022  - s/p SuperK OS on 12.12.2022  8. History of herpes zoster/iritis OS  - on Valtrex 1 g daily---maintenance  9. POAG OU  - was under the expert management of Dr. Alben Spittle, now follows with Dr. Zenaida Niece  - IOP 18,20  - continue Cosopt bid OU  Ophthalmic Meds Ordered this visit:  Meds ordered this encounter  Medications   faricimab-svoa (VABYSMO) 6mg /0.14mL intravitreal injection     This document serves as a record of services personally performed by Karie Chimera, MD, PhD. It was created on their behalf by Gerilyn Nestle, COT an ophthalmic technician. The creation of this record is the provider's dictation and/or activities during the visit.    Electronically signed by:   Charlette Caffey, COT  04/02/23 5:26 PM  This document serves as a record of services personally performed by Karie Chimera, MD, PhD. It was created on their behalf by Gerilyn Nestle, COT an ophthalmic technician. The creation of this record is the provider's dictation and/or activities during the visit.    Electronically signed by:  Wynona Canes  Esmond Camper, COT  04/02/23 5:26 PM  Karie Chimera, M.D., Ph.D. Diseases & Surgery of the Retina and Vitreous [facility]  I have reviewed the above documentation for accuracy and completeness, and I agree with the above. Karie Chimera, M.D., Ph.D. 04/02/23 5:28 PM   Abbreviations: M myopia (nearsighted); A astigmatism; H hyperopia (farsighted); P presbyopia; Mrx spectacle prescription;  CTL contact lenses; OD right eye; OS left eye; OU both eyes  XT exotropia; ET esotropia; PEK punctate epithelial keratitis; PEE punctate epithelial erosions; DES dry eye syndrome; MGD meibomian gland dysfunction; ATs artificial tears; PFAT's preservative free artificial tears; NSC nuclear sclerotic cataract; PSC posterior subcapsular cataract; ERM epi-retinal membrane; PVD posterior vitreous detachment; RD retinal detachment; DM diabetes mellitus; DR diabetic retinopathy; NPDR non-proliferative diabetic retinopathy; PDR proliferative diabetic retinopathy; CSME clinically significant macular edema; DME diabetic macular edema; dbh dot blot hemorrhages; CWS cotton wool spot; POAG primary open angle glaucoma; C/D cup-to-disc ratio; HVF humphrey visual field; GVF goldmann visual field; OCT optical coherence tomography; IOP intraocular pressure; BRVO Branch retinal vein occlusion; CRVO central retinal vein occlusion; CRAO central retinal artery occlusion; BRAO branch retinal artery occlusion; RT retinal tear; SB scleral buckle; PPV pars plana vitrectomy; VH Vitreous hemorrhage; PRP panretinal laser photocoagulation; IVK intravitreal kenalog; VMT  vitreomacular traction; MH Macular hole;  NVD neovascularization of the disc; NVE neovascularization elsewhere; AREDS age related eye disease study; ARMD age related macular degeneration; POAG primary open angle glaucoma; EBMD epithelial/anterior basement membrane dystrophy; ACIOL anterior chamber intraocular lens; IOL intraocular lens; PCIOL posterior chamber intraocular lens; Phaco/IOL phacoemulsification with intraocular lens placement; PRK photorefractive keratectomy; LASIK laser assisted in situ keratomileusis; HTN hypertension; DM diabetes mellitus; COPD chronic obstructive pulmonary disease

## 2023-03-29 ENCOUNTER — Inpatient Hospital Stay: Payer: Medicare Other | Attending: Hematology

## 2023-03-29 DIAGNOSIS — I10 Essential (primary) hypertension: Secondary | ICD-10-CM | POA: Diagnosis not present

## 2023-03-29 DIAGNOSIS — Z9049 Acquired absence of other specified parts of digestive tract: Secondary | ICD-10-CM | POA: Diagnosis not present

## 2023-03-29 DIAGNOSIS — E538 Deficiency of other specified B group vitamins: Secondary | ICD-10-CM | POA: Diagnosis not present

## 2023-03-29 DIAGNOSIS — K5730 Diverticulosis of large intestine without perforation or abscess without bleeding: Secondary | ICD-10-CM | POA: Diagnosis not present

## 2023-03-29 DIAGNOSIS — Z87891 Personal history of nicotine dependence: Secondary | ICD-10-CM | POA: Diagnosis not present

## 2023-03-29 DIAGNOSIS — K449 Diaphragmatic hernia without obstruction or gangrene: Secondary | ICD-10-CM | POA: Diagnosis not present

## 2023-03-29 DIAGNOSIS — Z79899 Other long term (current) drug therapy: Secondary | ICD-10-CM | POA: Diagnosis not present

## 2023-03-29 DIAGNOSIS — Z808 Family history of malignant neoplasm of other organs or systems: Secondary | ICD-10-CM | POA: Insufficient documentation

## 2023-03-29 DIAGNOSIS — Z800 Family history of malignant neoplasm of digestive organs: Secondary | ICD-10-CM | POA: Diagnosis not present

## 2023-03-29 DIAGNOSIS — D509 Iron deficiency anemia, unspecified: Secondary | ICD-10-CM | POA: Diagnosis present

## 2023-03-29 DIAGNOSIS — Z7902 Long term (current) use of antithrombotics/antiplatelets: Secondary | ICD-10-CM | POA: Diagnosis not present

## 2023-03-29 DIAGNOSIS — K635 Polyp of colon: Secondary | ICD-10-CM | POA: Diagnosis not present

## 2023-03-29 DIAGNOSIS — D500 Iron deficiency anemia secondary to blood loss (chronic): Secondary | ICD-10-CM

## 2023-03-29 DIAGNOSIS — Z9079 Acquired absence of other genital organ(s): Secondary | ICD-10-CM | POA: Insufficient documentation

## 2023-03-29 LAB — IRON AND TIBC
Iron: 69 ug/dL (ref 45–182)
Saturation Ratios: 18 % (ref 17.9–39.5)
TIBC: 383 ug/dL (ref 250–450)
UIBC: 314 ug/dL

## 2023-03-29 LAB — VITAMIN B12: Vitamin B-12: 519 pg/mL (ref 180–914)

## 2023-03-29 LAB — FERRITIN: Ferritin: 31 ng/mL (ref 24–336)

## 2023-04-02 ENCOUNTER — Encounter (INDEPENDENT_AMBULATORY_CARE_PROVIDER_SITE_OTHER): Payer: Self-pay | Admitting: Ophthalmology

## 2023-04-02 ENCOUNTER — Ambulatory Visit (INDEPENDENT_AMBULATORY_CARE_PROVIDER_SITE_OTHER): Payer: Medicare Other | Admitting: Ophthalmology

## 2023-04-02 DIAGNOSIS — H35033 Hypertensive retinopathy, bilateral: Secondary | ICD-10-CM

## 2023-04-02 DIAGNOSIS — Z794 Long term (current) use of insulin: Secondary | ICD-10-CM

## 2023-04-02 DIAGNOSIS — Z961 Presence of intraocular lens: Secondary | ICD-10-CM

## 2023-04-02 DIAGNOSIS — H18523 Epithelial (juvenile) corneal dystrophy, bilateral: Secondary | ICD-10-CM

## 2023-04-02 DIAGNOSIS — H401130 Primary open-angle glaucoma, bilateral, stage unspecified: Secondary | ICD-10-CM

## 2023-04-02 DIAGNOSIS — E113213 Type 2 diabetes mellitus with mild nonproliferative diabetic retinopathy with macular edema, bilateral: Secondary | ICD-10-CM | POA: Diagnosis not present

## 2023-04-02 DIAGNOSIS — Z8619 Personal history of other infectious and parasitic diseases: Secondary | ICD-10-CM

## 2023-04-02 DIAGNOSIS — Z7984 Long term (current) use of oral hypoglycemic drugs: Secondary | ICD-10-CM | POA: Diagnosis not present

## 2023-04-02 DIAGNOSIS — I10 Essential (primary) hypertension: Secondary | ICD-10-CM | POA: Diagnosis not present

## 2023-04-02 MED ORDER — FARICIMAB-SVOA 6 MG/0.05ML IZ SOLN
6.0000 mg | INTRAVITREAL | Status: AC | PRN
Start: 2023-04-02 — End: 2023-04-02
  Administered 2023-04-02: 6 mg via INTRAVITREAL

## 2023-04-03 ENCOUNTER — Other Ambulatory Visit: Payer: Self-pay

## 2023-04-03 DIAGNOSIS — D500 Iron deficiency anemia secondary to blood loss (chronic): Secondary | ICD-10-CM

## 2023-04-03 DIAGNOSIS — E538 Deficiency of other specified B group vitamins: Secondary | ICD-10-CM

## 2023-04-04 ENCOUNTER — Inpatient Hospital Stay: Payer: Medicare Other

## 2023-04-04 ENCOUNTER — Inpatient Hospital Stay: Payer: Medicare Other | Admitting: Hematology

## 2023-04-04 VITALS — BP 131/60 | HR 59 | Temp 98.0°F | Resp 18 | Ht 70.0 in | Wt 241.1 lb

## 2023-04-04 DIAGNOSIS — E538 Deficiency of other specified B group vitamins: Secondary | ICD-10-CM

## 2023-04-04 DIAGNOSIS — D500 Iron deficiency anemia secondary to blood loss (chronic): Secondary | ICD-10-CM

## 2023-04-04 DIAGNOSIS — D509 Iron deficiency anemia, unspecified: Secondary | ICD-10-CM | POA: Diagnosis not present

## 2023-04-04 LAB — IRON AND TIBC
Iron: 61 ug/dL (ref 45–182)
Saturation Ratios: 15 % — ABNORMAL LOW (ref 17.9–39.5)
TIBC: 404 ug/dL (ref 250–450)
UIBC: 343 ug/dL

## 2023-04-04 LAB — CBC WITH DIFFERENTIAL/PLATELET
Abs Immature Granulocytes: 0.02 10*3/uL (ref 0.00–0.07)
Basophils Absolute: 0 10*3/uL (ref 0.0–0.1)
Basophils Relative: 0 %
Eosinophils Absolute: 0.2 10*3/uL (ref 0.0–0.5)
Eosinophils Relative: 2 %
HCT: 40.5 % (ref 39.0–52.0)
Hemoglobin: 13.5 g/dL (ref 13.0–17.0)
Immature Granulocytes: 0 %
Lymphocytes Relative: 15 %
Lymphs Abs: 1.1 10*3/uL (ref 0.7–4.0)
MCH: 32.8 pg (ref 26.0–34.0)
MCHC: 33.3 g/dL (ref 30.0–36.0)
MCV: 98.5 fL (ref 80.0–100.0)
Monocytes Absolute: 0.6 10*3/uL (ref 0.1–1.0)
Monocytes Relative: 8 %
Neutro Abs: 5.1 10*3/uL (ref 1.7–7.7)
Neutrophils Relative %: 75 %
Platelets: 224 10*3/uL (ref 150–400)
RBC: 4.11 MIL/uL — ABNORMAL LOW (ref 4.22–5.81)
RDW: 14.8 % (ref 11.5–15.5)
WBC: 6.9 10*3/uL (ref 4.0–10.5)
nRBC: 0 % (ref 0.0–0.2)

## 2023-04-04 LAB — FERRITIN: Ferritin: 26 ng/mL (ref 24–336)

## 2023-04-04 LAB — VITAMIN B12: Vitamin B-12: 505 pg/mL (ref 180–914)

## 2023-04-04 NOTE — Patient Instructions (Addendum)
[facility] - [facility]  Discharge Instructions  You were seen and examined today by Dr. Ellin Saba.  Dr. Ellin Saba discussed your most recent lab work which revealed that your iron is low again.  Dr. Ellin Saba will get you scheduled for IV iron.   Continue taking the vitamin B 12 and the Iron once daily in the morning.  Follow-up as scheduled in 4 months.    Thank you for choosing [facility] to provide your oncology and hematology care.   To afford each patient quality time with our provider, please arrive at least 15 minutes before your scheduled appointment time. You may need to reschedule your appointment if you arrive late (10 or more minutes). Arriving late affects you and other patients whose appointments are after yours.  Also, if you miss three or more appointments without notifying the office, you may be dismissed from the clinic at the provider's discretion.    Again, thank you for choosing [facility].  Our hope is that these requests will decrease the amount of time that you wait before being seen by our physicians.   If you have a lab appointment with the Cancer Center - please note that after April 8th, all labs will be drawn in the cancer center.  You do not have to check in or register with the main entrance as you have in the past but will complete your check-in at the cancer center.            _____________________________________________________________  Should you have questions after your visit to [facility], please contact our office at [phone] and follow the prompts.  Our office hours are 8:00 a.m. to 4:30 p.m. Monday - Thursday and 8:00 a.m. to 2:30 p.m. Friday.  Please note that voicemails left after 4:00 p.m. may not be returned until the following business day.  We are closed weekends and all major holidays.  You do have access to a nurse 24-7, just call the main number to the clinic  [phone] and do not press any options, hold on the line and a nurse will answer the phone.    For prescription refill requests, have your pharmacy contact our office and allow 72 hours.    Masks are no longer required in the cancer centers. If you would like for your care team to wear a mask while they are taking care of you, please let them know. You may have one support person who is at least 80 years old accompany you for your appointments.

## 2023-04-04 NOTE — Progress Notes (Signed)
[facility] [address], [place] [address]    Clinic Day:  04/04/2023  Referring physician: Gareth Morgan, MD  Patient Care Team: Gareth Morgan, MD as PCP - General (Family Medicine) Hillis Range, MD (Inactive) as PCP - Electrophysiology (Cardiology) Jonelle Sidle, MD as PCP - Cardiology (Cardiology)   ASSESSMENT & PLAN:   Assessment: 1.  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  Small bowel nodule-junction distal duodenum/jejunum.  Irregular with a submucosal component, suspicious in appearance.  Pathology was benign. - Colonoscopy (05/24/2022): 5 mm polyp in the sigmoid colon, diverticulosis in the descending colon and at the splenic flexure.  Pathology-hyperplastic polyp. - EGD (05/24/2022): Normal esophagus, small hiatal hernia, friable gastric mucosa, couple of tiny gastric erosions, normal duodenal bulb and second part of duodenum. - He was on Eliquis for about 4 years until September 2023 when he had Watchman procedure done.  Since then he is on Plavix daily. - He reports 1 episode of tarry stool in October 2023. - He is taking iron tablet twice daily with vitamin C since his discharge from the hospital in August 2023.   2.  Social/family history: - Lives at home by himself.  He is seen today with his daughter Lucas Dudley.  He is independent of ADLs and IADLs.  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  He has exposure to torrent chemicals.  He smoked 1 pack/day for 35 years and quit smoking in 1992. - No family history of significant anemia. - Mother had mesothelioma.  Maternal aunt had colon cancer.  Maternal uncle had adrenal gland cancer.    Plan: 1.  Normocytic anemia from blood loss: - Received Feraheme on 01/02/2023 and 01/09/2023. - Taking iron twice daily. - Reviewed labs from 04/04/2023.  Ferritin is 26  and percent saturation 15.  Hemoglobin is 13.5. - He complains of feeling tired.  Obviously the oral iron is not helping.  He had improvement in energy levels after last iron infusions.  Hence I have recommended 2 more infusions of Feraheme.  He may decrease his iron to 1 tablet daily and take it with vitamin C. - Recommend RTC 4 months with repeat labs.  2.  B12 deficiency: - Continue B12 1 mg tablet daily.  B12 level is normal at 505.    Orders Placed This Encounter  Procedures   CBC    Standing Status:   Future    Standing Expiration Date:   04/03/2024   Ferritin    Standing Status:   Future    Standing Expiration Date:   04/03/2024    Order Specific Question:   Release to patient    Answer:   Immediate   Iron and TIBC    Standing Status:   Future    Standing Expiration Date:   04/03/2024    Order Specific Question:   Release to patient    Answer:   Immediate      I,Katie Daubenspeck,acting as a scribe for Doreatha Massed, MD.,have documented all relevant documentation on the behalf of Doreatha Massed, MD,as directed by  Doreatha Massed, MD while in the presence of Doreatha Massed, MD.   I, Doreatha Massed MD, have reviewed the above documentation for accuracy and completeness, and I agree with the above.   Doreatha Massed, MD   6/20/20246:04 PM  CHIEF COMPLAINT:   Diagnosis:  Normocytic anemia    Cancer Staging  No matching staging information was found for the patient.   Prior Therapy: Feraheme x2 12/2022  Current Therapy:  Oral iron and B12 supplements    HISTORY OF PRESENT ILLNESS:   Oncology History   No history exists.     INTERVAL HISTORY:   Lucas Dudley is a 82 y.o. male presenting to clinic today for follow up of Normocytic anemia. He was last seen by me on 12/31/22.  Today, he states that he is doing well overall. His appetite level is at 75%. His energy level is at 40%.  PAST MEDICAL HISTORY:   Past Medical History: Past Medical  History:  Diagnosis Date   Arthritis    Atrial fibrillation (HCC)    CAD (coronary artery disease)    a. Cath 03/17/15 showing 100% ostial D1, 50% prox LAD to mid LAD, 40% RPDA stenosis. Med rx. // Myoview 01/2020: EF 31 diffuse perfusion defect without reversibility (suspect artifact); reviewed with Dr. Tama High study felt to be low risk   Cataract    Mixed form OD   Chronic diastolic CHF (congestive heart failure) (HCC)    Diabetic retinopathy (HCC)    NPDR OU   Essential hypertension    Glaucoma    POAG OU   History of gout    Hyperlipidemia    Hypertensive retinopathy    OU   Kidney stones    Melanoma of neck (HCC)    NICM (nonischemic cardiomyopathy) (HCC)    OSA on CPAP 2012   Prostate cancer (HCC)    Type II diabetes mellitus (HCC)     Surgical History: Past Surgical History:  Procedure Laterality Date   ABDOMINAL HERNIA REPAIR     w/mesh   ATRIAL FIBRILLATION ABLATION N/A 06/06/2021   Procedure: ATRIAL FIBRILLATION ABLATION;  Surgeon: Hillis Range, MD;  Location: [facility] INVASIVE CV LAB;  Service: Cardiovascular;  Laterality: N/A;   BIOPSY  05/24/2022   Procedure: BIOPSY;  Surgeon: Corbin Ade, MD;  Location: [facility];  Service: Endoscopy;;   BIOPSY  05/30/2022   Procedure: BIOPSY;  Surgeon: Corbin Ade, MD;  Location: [facility];  Service: Endoscopy;;   BIOPSY  01/10/2023   Procedure: BIOPSY;  Surgeon: Lemar Lofty., MD;  Location: [facility];  Service: Gastroenterology;;   CARDIAC CATHETERIZATION N/A 03/17/2015   Procedure: Left Heart Cath and Coronary Angiography;  Surgeon: Runell Gess, MD;  Location: [facility] INVASIVE CV LAB;  Service: Cardiovascular;  Laterality: N/A;   CARDIOVERSION N/A 08/09/2021   Procedure: CARDIOVERSION;  Surgeon: Chilton Si, MD;  Location: [facility] ENDOSCOPY;  Service: Cardiovascular;  Laterality: N/A;   CARDIOVERSION N/A 11/10/2021   Procedure: CARDIOVERSION;  Surgeon: Thomasene Ripple, DO;  Location: [facility] ENDOSCOPY;   Service: Cardiovascular;  Laterality: N/A;   carotid doppler  09/17/2008   rigt and left ICAs 0-49%;mildly  abnormal   CATARACT EXTRACTION Left 2020   Dr. Alben Spittle   COLONOSCOPY N/A 05/16/2017   Procedure: COLONOSCOPY;  Surgeon: Malissa Hippo, MD;  Location: [facility];  Service: Endoscopy;  Laterality: N/A;  930   COLONOSCOPY WITH PROPOFOL N/A 05/24/2022   Procedure: COLONOSCOPY WITH PROPOFOL;  Surgeon: Corbin Ade, MD;  Location: [facility];  Service: Endoscopy;  Laterality: N/A;   DOPPLER ECHOCARDIOGRAPHY  05/25/2009   EF 50-55%,LA mildly dilated, LV function normal   ELECTROPHYSIOLOGIC STUDY N/A 04/05/2015   Procedure: Cardioversion;  Surgeon: Thurmon Fair, MD;  Location: [facility]AS[facility] CV LAB;  Service:  Cardiovascular;  Laterality: N/A;   ELECTROPHYSIOLOGIC STUDY N/A 09/06/2015   Procedure: Atrial Fibrillation Ablation;  Surgeon: Hillis Range, MD;  Location: [facility] INVASIVE CV LAB;  Service: Cardiovascular;  Laterality: N/A;   ELECTROPHYSIOLOGIC STUDY N/A 07/12/2016   redo afib ablation by Dr Johney Frame   ENTEROSCOPY N/A 05/30/2022   Procedure: ENTEROSCOPY;  Surgeon: Corbin Ade, MD;  [facility]: [facility];  Service: Endoscopy;  Laterality: N/A;   ESOPHAGOGASTRODUODENOSCOPY (EGD) WITH PROPOFOL N/A 05/24/2022   Procedure: ESOPHAGOGASTRODUODENOSCOPY (EGD) WITH PROPOFOL;  Surgeon: Corbin Ade, MD;  [facility]: [facility];  Service: Endoscopy;  Laterality: N/A;   ESOPHAGOGASTRODUODENOSCOPY (EGD) WITH PROPOFOL N/A 01/10/2023   Procedure: ESOPHAGOGASTRODUODENOSCOPY (EGD) WITH PROPOFOL;  Surgeon: Meridee Score Netty Starring., MD;  Location: [facility] ENDOSCOPY;  Service: Gastroenterology;  Laterality: N/A;   EUS N/A 01/10/2023   Procedure: UPPER ENDOSCOPIC ULTRASOUND (EUS) RADIAL;  Surgeon: Lemar Lofty., MD;  Location: [facility] ENDOSCOPY;  Service: Gastroenterology;  Laterality: N/A;   EXCISIONAL HEMORRHOIDECTOMY     "inside and out"   EYE SURGERY Left 2020   Cat Sx - Dr. Alben Spittle   FINE NEEDLE  ASPIRATION Right    knee; "drew ~ 1 quart off"   GIVENS CAPSULE STUDY N/A 05/25/2022   Procedure: GIVENS CAPSULE STUDY;  Surgeon: Lanelle Bal, DO;  Location: [facility];  Service: Endoscopy;  Laterality: N/A;   GIVENS CAPSULE STUDY N/A 01/23/2023   Procedure: GIVENS CAPSULE STUDY;  Surgeon: Dolores Frame, MD;  Location: [facility];  Service: Gastroenterology;  Laterality: N/A;  8:30am   HERNIA REPAIR     HOT HEMOSTASIS N/A 01/10/2023   Procedure: HOT HEMOSTASIS (ARGON PLASMA COAGULATION/BICAP);  Surgeon: Lemar Lofty., MD;  Location: [facility];  Service: Gastroenterology;  Laterality: N/A;   LAPAROSCOPIC CHOLECYSTECTOMY     LEFT ATRIAL APPENDAGE OCCLUSION N/A 06/28/2022   Procedure: LEFT ATRIAL APPENDAGE OCCLUSION;  Surgeon: Tonny Bollman, MD;  Location: [facility] INVASIVE CV LAB;  Service: Cardiovascular;  Laterality: N/A;   MELANOMA EXCISION Right    "neck"   NM MYOCAR PERF WALL MOTION  02/21/2012   EF 61% ,EXERCISE 7 METS. exercise stopped due to wheezing and shortness of breathe   POLYPECTOMY  05/16/2017   Procedure: POLYPECTOMY;  Surgeon: Malissa Hippo, MD;  [facility]: [facility];  Service: Endoscopy;;  colon   POLYPECTOMY  05/24/2022   Procedure: POLYPECTOMY;  Surgeon: Corbin Ade, MD;  [facility];  Service: Endoscopy;;   POLYPECTOMY  01/10/2023   Procedure: POLYPECTOMY;  Surgeon: Meridee Score Netty Starring., MD;  Location: [facility];  Service: Gastroenterology;;   PROSTATECTOMY     SHOULDER OPEN ROTATOR CUFF REPAIR Right X 2   SUBMUCOSAL TATTOO INJECTION  05/30/2022   Procedure: SUBMUCOSAL TATTOO INJECTION;  Surgeon: Corbin Ade, MD;  Location: [facility];  Service: Endoscopy;;   SUBMUCOSAL TATTOO INJECTION  01/10/2023   Procedure: SUBMUCOSAL TATTOO INJECTION;  Surgeon: Lemar Lofty., MD;  Location: [facility]OS[facility];  Service: Gastroenterology;;   TEE WITHOUT CARDIOVERSION N/A 09/05/2015   Procedure: TRANSESOPHAGEAL  ECHOCARDIOGRAM (TEE);  Surgeon: Quintella Reichert, MD;  Location: [facility] ENDOSCOPY;  Service: Cardiovascular;  Laterality: N/A;   TEE WITHOUT CARDIOVERSION N/A 06/28/2022   Procedure: TRANSESOPHAGEAL ECHOCARDIOGRAM (TEE);  Surgeon: Tonny Bollman, MD;  Location: [facility] INVASIVE CV LAB;  Service: Cardiovascular;  Laterality: N/A;    Social History: Social History   Socioeconomic History   Marital status: Widowed    Spouse name: Not on file   Number  of children: 1   Years of education: Not on file   Highest education level: Not on file  Occupational History   Occupation: Retired  Tobacco Use   Smoking status: Former    Packs/day: 2.00    Years: 27.00    Additional pack years: 0.00    Total pack years: 54.00    Types: Cigarettes    Quit date: 1992    Years since quitting: 32.4    Passive exposure: Current   Smokeless tobacco: Never   Tobacco comments:    Former smoker 09/27/21  Vaping Use   Vaping Use: Never used  Substance and Sexual Activity   Alcohol use: No    Alcohol/week: 0.0 standard drinks of alcohol    Comment: "used to drink; stopped ~ 2008"   Drug use: No   Sexual activity: Not Currently  Other Topics Concern   Not on file  Social History Narrative   Lives in [place], [place] with wife.   Social Determinants of Health   Financial Resource Strain: Not on file  Food Insecurity: No Food Insecurity (09/11/2022)   Hunger Vital Sign    Worried About Running Out of Food in the Last Year: Never true    Ran Out of Food in the Last Year: Never true  Transportation Needs: No Transportation Needs (09/11/2022)   PRAPARE - Administrator, Civil Service (Medical): No    Lack of Transportation (Non-Medical): No  Physical Activity: Not on file  Stress: Not on file  Social Connections: Not on file  Intimate Partner Violence: Not At Risk (09/11/2022)   Humiliation, Afraid, Rape, and Kick questionnaire    Fear of Current or Ex-Partner: No    Emotionally Abused: No     Physically Abused: No    Sexually Abused: No    Family History: Family History  Problem Relation Age of Onset   Heart disease Mother    Lung cancer Mother    Heart attack Mother 93   Diabetes Father    Heart disease Father    Stroke Brother    Healthy Daughter    Colon cancer Maternal Aunt        70s    Current Medications:  Current Outpatient Medications:    acetaminophen (TYLENOL) 650 MG CR tablet, Take 1,300 mg by mouth every 8 (eight) hours as needed for pain., Disp: , Rfl:    albuterol (VENTOLIN HFA) 108 (90 Base) MCG/ACT inhaler, Inhale 2 puffs into the lungs every 4 (four) hours as needed for wheezing or shortness of breath., Disp: , Rfl:    amiodarone (PACERONE) 200 MG tablet, TAKE 1 TABLET BY MOUTH EVERY DAY, Disp: 90 tablet, Rfl: 1   Artificial Tear Ointment (DRY EYES OP), Place 1 drop into both eyes daily as needed (Dry eye)., Disp: , Rfl:    Ascorbic Acid (VITAMIN C PO), Take 500 mg by mouth daily., Disp: , Rfl:    aspirin 81 MG chewable tablet, Chew 81 mg by mouth daily., Disp: , Rfl:    camphor-menthol (ANTI-ITCH) lotion, Apply 1 Application topically daily as needed for itching., Disp: , Rfl:    carvedilol (COREG) 3.125 MG tablet, TAKE 1 TABLET BY MOUTH 2 TIMES DAILY., Disp: 180 tablet, Rfl: 3   COMBIGAN 0.2-0.5 % ophthalmic solution, Place 1 drop into both eyes 2 (two) times daily., Disp: , Rfl:    CRANBERRY PO, Take 1 tablet by mouth daily., Disp: , Rfl:    cyanocobalamin (VITAMIN B12) 1000 MCG tablet,  Take 1 tablet (1,000 mcg total) by mouth daily., Disp: , Rfl:    docusate sodium (COLACE) 100 MG capsule, Take 1 capsule (100 mg total) by mouth daily., Disp: 60 capsule, Rfl: 2   ELDERBERRY PO, Take 2 tablets by mouth daily. Chewable, Disp: , Rfl:    gemfibrozil (LOPID) 600 MG tablet, Take 600 mg by mouth at bedtime. , Disp: , Rfl:    glimepiride (AMARYL) 2 MG tablet, Take 2 mg by mouth daily in the afternoon., Disp: , Rfl:    Insulin Glargine (BASAGLAR KWIKPEN)  100 UNIT/ML, Inject 34 Units into the skin at bedtime., Disp: , Rfl:    KLOR-CON M20 20 MEQ tablet, TAKE 1 TABLET BY MOUTH EVERY DAY, Disp: 90 tablet, Rfl: 1   lisinopril (ZESTRIL) 20 MG tablet, TAKE 1 TABLET BY MOUTH EVERY DAY, Disp: 90 tablet, Rfl: 1   magnesium gluconate (MAGONATE) 500 MG tablet, Take 500 mg by mouth daily., Disp: , Rfl:    metFORMIN (GLUCOPHAGE-XR) 500 MG 24 hr tablet, Take 1,000 mg by mouth in the morning and at bedtime., Disp: , Rfl:    mineral oil liquid, Take 15 mLs by mouth daily as needed for moderate constipation., Disp: , Rfl:    Multiple Vitamins-Minerals (MULTIVITAMIN WITH MINERALS) tablet, Take 1 tablet by mouth daily. Centrum men 50+, Disp: , Rfl:    Omega-3 1000 MG CAPS, Take 1,000 mg by mouth daily., Disp: , Rfl:    OVER THE COUNTER MEDICATION, Take 1 tablet by mouth daily. Beets Chew, Disp: , Rfl:    pantoprazole (PROTONIX) 40 MG tablet, Take 1 tablet (40 mg total) by mouth 2 (two) times daily before a meal. Twice daily for 1 month then may decrease back to once daily., Disp: 60 tablet, Rfl: 12   torsemide (DEMADEX) 20 MG tablet, TAKE 1 TABLET BY MOUTH TWICE A DAY, Disp: 180 tablet, Rfl: 1   triamcinolone cream (KENALOG) 0.1 %, Apply 1 Application topically 2 (two) times daily as needed (itch)., Disp: , Rfl:    valACYclovir (VALTREX) 1000 MG tablet, Take 1,000 mg by mouth at bedtime., Disp: , Rfl:    ferrous sulfate 325 (65 FE) MG tablet, Take 1 tablet (325 mg total) by mouth daily with breakfast. (Patient taking differently: Take 325 mg by mouth 2 (two) times daily with a meal.), Disp: 120 tablet, Rfl: 1  Current Facility-Administered Medications:    0.9 %  sodium chloride infusion, , Intravenous, Continuous, Tonny Bollman, MD   0.9 %  sodium chloride infusion, , Intravenous, Continuous, Tonny Bollman, MD   Allergies: Allergies  Allergen Reactions   Azithromycin Nausea Only   Tape Rash and Other (See Comments)    Causes skin redness, Use paper tape  only.    REVIEW OF SYSTEMS:   Review of Systems  Constitutional:  Negative for chills, fatigue and fever.  HENT:   Negative for lump/mass, mouth sores, nosebleeds, sore throat and trouble swallowing.   Eyes:  Negative for eye problems.  Respiratory:  Positive for shortness of breath. Negative for cough.   Cardiovascular:  Negative for chest pain, leg swelling and palpitations.  Gastrointestinal:  Positive for constipation. Negative for abdominal pain, diarrhea, nausea and vomiting.  Genitourinary:  Negative for bladder incontinence, difficulty urinating, dysuria, frequency, hematuria and nocturia.   Musculoskeletal:  Negative for arthralgias, back pain, flank pain, myalgias and neck pain.  Skin:  Negative for itching and rash.  Neurological:  Negative for dizziness, headaches and numbness.  Hematological:  Does not bruise/bleed  easily.  Psychiatric/Behavioral:  Negative for depression, sleep disturbance and suicidal ideas. The patient is not nervous/anxious.   All other systems reviewed and are negative.    VITALS:   Blood pressure 131/60, pulse (!) 59, temperature 98 F (36.7 C), temperature source Tympanic, resp. rate 18, height 5\' 10"  (1.778 m), weight 241 lb 1.6 oz (109.4 kg), SpO2 93 %.  Wt Readings from Last 3 Encounters:  04/04/23 241 lb 1.6 oz (109.4 kg)  01/23/23 230 lb (104.3 kg)  01/10/23 233 lb (105.7 kg)    Body mass index is 34.59 kg/m.  Performance status (ECOG): 1 - Symptomatic but completely ambulatory  PHYSICAL EXAM:   Physical Exam Vitals and nursing note reviewed. Exam conducted with a chaperone present.  Constitutional:      Appearance: Normal appearance.  Cardiovascular:     Rate and Rhythm: Normal rate and regular rhythm.     Pulses: Normal pulses.     Heart sounds: Normal heart sounds.  Pulmonary:     Effort: Pulmonary effort is normal.     Breath sounds: Normal breath sounds.  Abdominal:     Palpations: Abdomen is soft. There is no  hepatomegaly, splenomegaly or mass.     Tenderness: There is no abdominal tenderness.  Musculoskeletal:     Right lower leg: No edema.     Left lower leg: No edema.  Lymphadenopathy:     Cervical: No cervical adenopathy.     Right cervical: No superficial, deep or posterior cervical adenopathy.    Left cervical: No superficial, deep or posterior cervical adenopathy.     Upper Body:     Right upper body: No supraclavicular or axillary adenopathy.     Left upper body: No supraclavicular or axillary adenopathy.  Neurological:     General: No focal deficit present.     Mental Status: He is alert and oriented to person, place, and time.  Psychiatric:        Mood and Affect: Mood normal.        Behavior: Behavior normal.     LABS:      Latest Ref Rng & Units 04/04/2023   12:28 PM 12/25/2022   11:19 AM 09/11/2022    2:27 PM  CBC  WBC 4.0 - 10.5 K/uL 6.9  5.4  6.8   Hemoglobin 13.0 - 17.0 g/dL 73.7  10.6  26.9   Hematocrit 39.0 - 52.0 % 40.5  32.4  36.5   Platelets 150 - 400 K/uL 224  276  254       Latest Ref Rng & Units 08/01/2022   11:23 AM 06/28/2022    8:26 AM 06/13/2022    3:14 PM  CMP  Glucose 70 - 99 mg/dL 485  462  703   BUN 8 - 27 mg/dL 22  23  15    Creatinine 0.76 - 1.27 mg/dL 5.00  9.38  1.82   Sodium 134 - 144 mmol/L 142  140  140   Potassium 3.5 - 5.2 mmol/L 5.2  3.8  4.9   Chloride 96 - 106 mmol/L 98  100  98   CO2 20 - 29 mmol/L 32  26  24   Calcium 8.6 - 10.2 mg/dL 9.4  9.3  9.5   Total Protein 6.0 - 8.5 g/dL 6.9     Total Bilirubin 0.0 - 1.2 mg/dL <9.9     Alkaline Phos 44 - 121 IU/L 75     AST 0 - 40 IU/L 15  ALT 0 - 44 IU/L 11        No results found for: "CEA1", "CEA" / No results found for: "CEA1", "CEA" No results found for: "PSA1" No results found for: "CAN199" No results found for: "CAN125"  Lab Results  Component Value Date   TOTALPROTELP 7.0 12/25/2022   ALBUMINELP 3.5 09/11/2022   A1GS 0.3 09/11/2022   A2GS 0.8 09/11/2022   BETS 1.0  09/11/2022   GAMS 1.0 09/11/2022   MSPIKE Not Observed 09/11/2022   SPEI Comment 09/11/2022   Lab Results  Component Value Date   TIBC 404 04/04/2023   TIBC 383 03/29/2023   TIBC 433 12/25/2022   FERRITIN 26 04/04/2023   FERRITIN 31 03/29/2023   FERRITIN 14 (L) 12/25/2022   IRONPCTSAT 15 (L) 04/04/2023   IRONPCTSAT 18 03/29/2023   IRONPCTSAT 5 (L) 12/25/2022   Lab Results  Component Value Date   LDH 124 09/11/2022   LDH 162 05/29/2022     STUDIES:   Intravitreal Injection, Pharmacologic Agent - OD - Right Eye  Result Date: 04/02/2023 Time Out 04/02/2023. 3:22 PM. Confirmed correct patient, procedure, site, and patient consented. Anesthesia Topical anesthesia was used. Anesthetic medications included Lidocaine 2%, Proparacaine 0.5%. Procedure Preparation included 5% betadine to ocular surface, eyelid speculum. A (32g) needle was used. Injection: 6 mg faricimab-svoa 6 MG/0.05ML   Route: Intravitreal, Site: Right Eye   NDC: [phone], Lot: [id number], Expiration date: 02/11/2025, Waste: 0 mL Post-op Post injection exam found visual acuity of at least counting fingers. The patient tolerated the procedure well. There were no complications. The patient received written and verbal post procedure care education.   OCT, Retina - OU - Both Eyes  Result Date: 04/02/2023 Right Eye Quality was good. Central Foveal Thickness: 346. Progression has improved. Findings include no SRF, abnormal foveal contour, intraretinal fluid, vitreomacular adhesion (Mild interval improvement in IRF/IRHM and foveal contour, partial PVD). Left Eye Quality was good. Central Foveal Thickness: 272. Progression has been stable. Findings include normal foveal contour, no IRF, no SRF, intraretinal hyper-reflective material (stable improvement in IRF/cystic changes centrally; interval improvement in trace IRHM; partial PVD). Notes *Images captured and stored on drive Diagnosis / Impression: +DME OU OD: Mild interval  improvement in IRF/IRHM and foveal contour, partial PVD OS: stable improvement in IRF/cystic changes centrally; interval improvement in trace IRHM; partial PVD Clinical management: See below Abbreviations: NFP - Normal foveal profile. CME - cystoid macular edema. PED - pigment epithelial detachment. IRF - intraretinal fluid. SRF - subretinal fluid. EZ - ellipsoid zone. ERM - epiretinal membrane. ORA - outer retinal atrophy. ORT - outer retinal tubulation. SRHM - subretinal hyper-reflective material. IRHM - intraretinal hyper-reflective material

## 2023-04-05 LAB — IMMUNOFIXATION ELECTROPHORESIS
IgA: 252 mg/dL (ref 61–437)
IgG (Immunoglobin G), Serum: 1167 mg/dL (ref 603–1613)
IgM (Immunoglobulin M), Srm: 71 mg/dL (ref 15–143)
Total Protein ELP: 7 g/dL (ref 6.0–8.5)

## 2023-04-11 ENCOUNTER — Inpatient Hospital Stay: Payer: Medicare Other

## 2023-04-11 VITALS — BP 162/62 | HR 51 | Temp 98.0°F | Resp 18

## 2023-04-11 DIAGNOSIS — D509 Iron deficiency anemia, unspecified: Secondary | ICD-10-CM | POA: Diagnosis not present

## 2023-04-11 DIAGNOSIS — D500 Iron deficiency anemia secondary to blood loss (chronic): Secondary | ICD-10-CM

## 2023-04-11 MED ORDER — EPINEPHRINE 0.3 MG/0.3ML IJ SOAJ: 0.3000 mg | Freq: Once | INTRAMUSCULAR | Status: DC | PRN

## 2023-04-11 MED ORDER — HEPARIN SOD (PORK) LOCK FLUSH 100 UNIT/ML IV SOLN: 250.0000 [IU] | Freq: Once | INTRAVENOUS | Status: DC | PRN

## 2023-04-11 MED ORDER — METHYLPREDNISOLONE SODIUM SUCC 125 MG IJ SOLR: 125.0000 mg | Freq: Once | INTRAMUSCULAR | Status: DC | PRN

## 2023-04-11 MED ORDER — HEPARIN SOD (PORK) LOCK FLUSH 100 UNIT/ML IV SOLN: 500.0000 [IU] | Freq: Once | INTRAVENOUS | Status: DC | PRN

## 2023-04-11 MED ORDER — FAMOTIDINE IN NACL 20-0.9 MG/50ML-% IV SOLN: 20.0000 mg | Freq: Once | INTRAVENOUS | Status: DC | PRN

## 2023-04-11 MED ORDER — SODIUM CHLORIDE 0.9 % IV SOLN: Freq: Once | INTRAVENOUS | Status: AC

## 2023-04-11 MED ORDER — ALTEPLASE 2 MG IJ SOLR: 2.0000 mg | Freq: Once | INTRAMUSCULAR | Status: DC | PRN

## 2023-04-11 MED ORDER — CETIRIZINE HCL 10 MG PO TABS
10.0000 mg | ORAL_TABLET | Freq: Once | ORAL | Status: AC
  Administered 2023-04-11: 10 mg via ORAL
  Filled 2023-04-11: qty 1

## 2023-04-11 MED ORDER — SODIUM CHLORIDE 0.9 % IV SOLN: Freq: Once | INTRAVENOUS | Status: DC | PRN

## 2023-04-11 MED ORDER — DIPHENHYDRAMINE HCL 50 MG/ML IJ SOLN: 50.0000 mg | Freq: Once | INTRAMUSCULAR | Status: DC | PRN

## 2023-04-11 MED ORDER — ALBUTEROL SULFATE HFA 108 (90 BASE) MCG/ACT IN AERS: 2.0000 | INHALATION_SPRAY | Freq: Once | RESPIRATORY_TRACT | Status: DC | PRN

## 2023-04-11 MED ORDER — SODIUM CHLORIDE 0.9 % IV SOLN
510.0000 mg | Freq: Once | INTRAVENOUS | Status: AC
  Administered 2023-04-11: 510 mg via INTRAVENOUS
  Filled 2023-04-11: qty 17

## 2023-04-11 MED ORDER — SODIUM CHLORIDE 0.9% FLUSH: 10.0000 mL | Freq: Once | INTRAVENOUS | Status: DC | PRN

## 2023-04-11 MED ORDER — ACETAMINOPHEN 325 MG PO TABS
650.0000 mg | ORAL_TABLET | Freq: Once | ORAL | Status: AC
  Administered 2023-04-11: 650 mg via ORAL
  Filled 2023-04-11: qty 2

## 2023-04-11 MED ORDER — SODIUM CHLORIDE 0.9% FLUSH: 3.0000 mL | Freq: Once | INTRAVENOUS | Status: DC | PRN

## 2023-04-11 NOTE — Patient Instructions (Signed)
[facility]  Discharge Instructions: Thank you for choosing [facility] to provide your oncology and hematology care.  If you have a lab appointment with the Cancer Center - please note that after April 8th, 2024, all labs will be drawn in the cancer center.  You do not have to check in or register with the main entrance as you have in the past but will complete your check-in in the cancer center.  Wear comfortable clothing and clothing appropriate for easy access to any Portacath or PICC line.   We strive to give you quality time with your provider. You may need to reschedule your appointment if you arrive late (15 or more minutes).  Arriving late affects you and other patients whose appointments are after yours.  Also, if you miss three or more appointments without notifying the office, you may be dismissed from the clinic at the provider's discretion.      For prescription refill requests, have your pharmacy contact our office and allow 72 hours for refills to be completed.    Today you received the following Feraheme, return as scheduled.   To help prevent nausea and vomiting after your treatment, we encourage you to take your nausea medication as directed.  BELOW ARE SYMPTOMS THAT SHOULD BE REPORTED IMMEDIATELY: *FEVER GREATER THAN 100.4 F (38 C) OR HIGHER *CHILLS OR SWEATING *NAUSEA AND VOMITING THAT IS NOT CONTROLLED WITH YOUR NAUSEA MEDICATION *UNUSUAL SHORTNESS OF BREATH *UNUSUAL BRUISING OR BLEEDING *URINARY PROBLEMS (pain or burning when urinating, or frequent urination) *BOWEL PROBLEMS (unusual diarrhea, constipation, pain near the anus) TENDERNESS IN MOUTH AND THROAT WITH OR WITHOUT PRESENCE OF ULCERS (sore throat, sores in mouth, or a toothache) UNUSUAL RASH, SWELLING OR PAIN  UNUSUAL VAGINAL DISCHARGE OR ITCHING   Items with * indicate a potential emergency and should be followed up as soon as possible or go to the Emergency Department if  any problems should occur.  Please show the CHEMOTHERAPY ALERT CARD or IMMUNOTHERAPY ALERT CARD at check-in to the Emergency Department and triage nurse.  Should you have questions after your visit or need to cancel or reschedule your appointment, please contact [facility] [phone]  and follow the prompts.  Office hours are 8:00 a.m. to 4:30 p.m. Monday - Friday. Please note that voicemails left after 4:00 p.m. may not be returned until the following business day.  We are closed weekends and major holidays. You have access to a nurse at all times for urgent questions. Please call the main number to the clinic [phone] and follow the prompts.  For any non-urgent questions, you may also contact your provider using MyChart. We now offer e-Visits for anyone 18 and older to request care online for non-urgent symptoms. For details visit [email or web address].   Also download the MyChart app! Go to the app store, search "MyChart", open the app, select [facility], and log in with your MyChart username and password.   

## 2023-04-11 NOTE — Progress Notes (Signed)
Patient presents today for iron infusion.  Patient is in satisfactory condition with no new complaints voiced.  Vital signs are stable.  We will proceed with infusion per provider orders. 

## 2023-04-11 NOTE — Progress Notes (Signed)
Patient tolerated iron infusion with no complaints voiced.  Peripheral IV site clean and dry with good blood return noted before and after infusion.  Band aid applied.  VSS with discharge and left in satisfactory condition with no s/s of distress noted.   

## 2023-04-16 ENCOUNTER — Encounter (INDEPENDENT_AMBULATORY_CARE_PROVIDER_SITE_OTHER): Payer: Self-pay | Admitting: *Deleted

## 2023-04-19 ENCOUNTER — Inpatient Hospital Stay: Payer: Medicare Other | Attending: Hematology

## 2023-04-19 VITALS — BP 153/76 | HR 61 | Temp 97.7°F | Resp 18

## 2023-04-19 DIAGNOSIS — E538 Deficiency of other specified B group vitamins: Secondary | ICD-10-CM | POA: Diagnosis not present

## 2023-04-19 DIAGNOSIS — Z833 Family history of diabetes mellitus: Secondary | ICD-10-CM | POA: Diagnosis not present

## 2023-04-19 DIAGNOSIS — Z823 Family history of stroke: Secondary | ICD-10-CM | POA: Insufficient documentation

## 2023-04-19 DIAGNOSIS — Z79899 Other long term (current) drug therapy: Secondary | ICD-10-CM | POA: Diagnosis not present

## 2023-04-19 DIAGNOSIS — Z800 Family history of malignant neoplasm of digestive organs: Secondary | ICD-10-CM | POA: Diagnosis not present

## 2023-04-19 DIAGNOSIS — D500 Iron deficiency anemia secondary to blood loss (chronic): Secondary | ICD-10-CM

## 2023-04-19 DIAGNOSIS — Z87891 Personal history of nicotine dependence: Secondary | ICD-10-CM | POA: Insufficient documentation

## 2023-04-19 DIAGNOSIS — R0602 Shortness of breath: Secondary | ICD-10-CM | POA: Insufficient documentation

## 2023-04-19 DIAGNOSIS — Z801 Family history of malignant neoplasm of trachea, bronchus and lung: Secondary | ICD-10-CM | POA: Diagnosis not present

## 2023-04-19 DIAGNOSIS — Z8249 Family history of ischemic heart disease and other diseases of the circulatory system: Secondary | ICD-10-CM | POA: Insufficient documentation

## 2023-04-19 DIAGNOSIS — D509 Iron deficiency anemia, unspecified: Secondary | ICD-10-CM | POA: Insufficient documentation

## 2023-04-19 MED ORDER — SODIUM CHLORIDE 0.9 % IV SOLN
510.0000 mg | Freq: Once | INTRAVENOUS | Status: AC
  Administered 2023-04-19: 510 mg via INTRAVENOUS
  Filled 2023-04-19: qty 510

## 2023-04-19 MED ORDER — ACETAMINOPHEN 325 MG PO TABS: 650.0000 mg | ORAL_TABLET | Freq: Once | ORAL | Status: DC

## 2023-04-19 MED ORDER — SODIUM CHLORIDE 0.9 % IV SOLN: Freq: Once | INTRAVENOUS | Status: AC

## 2023-04-19 MED ORDER — CETIRIZINE HCL 10 MG PO TABS: 10.0000 mg | ORAL_TABLET | Freq: Once | ORAL | Status: DC

## 2023-04-19 NOTE — Progress Notes (Signed)
Patient presents today for iron infusion.  Patient is in satisfactory condition with no new complaints voiced.  Vital signs are stable.  IV placed in L arm.  IV flushed well with good blood return noted.  Pre-medications were taken at 1230 at home prior to visit.  We will proceed with infusion per provider orders.

## 2023-04-19 NOTE — Progress Notes (Signed)
Patient tolerated treatment well with no complaints voiced.  Patient left ambulatory in stable condition.  Vital signs stable at discharge.  Follow up as scheduled.    

## 2023-04-19 NOTE — Patient Instructions (Signed)
[facility]  Discharge Instructions: Thank you for choosing [facility] to provide your oncology and hematology care.  If you have a lab appointment with the Cancer Center - please note that after April 8th, 2024, all labs will be drawn in the cancer center.  You do not have to check in or register with the main entrance as you have in the past but will complete your check-in in the cancer center.  Wear comfortable clothing and clothing appropriate for easy access to any Portacath or PICC line.   We strive to give you quality time with your provider. You may need to reschedule your appointment if you arrive late (15 or more minutes).  Arriving late affects you and other patients whose appointments are after yours.  Also, if you miss three or more appointments without notifying the office, you may be dismissed from the clinic at the provider's discretion.      For prescription refill requests, have your pharmacy contact our office and allow 72 hours for refills to be completed.    Today you received the following Feraheme.  Ferumoxytol Injection What is this medication? FERUMOXYTOL (FER ue MOX i tol) treats low levels of iron in your body (iron deficiency anemia). Iron is a mineral that plays an important role in making red blood cells, which carry oxygen from your lungs to the rest of your body. This medicine may be used for other purposes; ask your health care provider or pharmacist if you have questions. COMMON BRAND NAME(S): Feraheme What should I tell my care team before I take this medication? They need to know if you have any of these conditions: Anemia not caused by low iron levels High levels of iron in the blood Magnetic resonance imaging (MRI) test scheduled An unusual or allergic reaction to iron, other medications, foods, dyes, or preservatives Pregnant or trying to get pregnant Breastfeeding How should I use this medication? This medication  is injected into a vein. It is given by your care team in a hospital or clinic setting. Talk to your care team the use of this medication in children. Special care may be needed. Overdosage: If you think you have taken too much of this medicine contact a poison control center or emergency room at once. NOTE: This medicine is only for you. Do not share this medicine with others. What if I miss a dose? It is important not to miss your dose. Call your care team if you are unable to keep an appointment. What may interact with this medication? Other iron products This list may not describe all possible interactions. Give your health care provider a list of all the medicines, herbs, non-prescription drugs, or dietary supplements you use. Also tell them if you smoke, drink alcohol, or use illegal drugs. Some items may interact with your medicine. What should I watch for while using this medication? Visit your care team regularly. Tell your care team if your symptoms do not start to get better or if they get worse. You may need blood work done while you are taking this medication. You may need to follow a special diet. Talk to your care team. Foods that contain iron include: whole grains/cereals, dried fruits, beans, or peas, leafy green vegetables, and organ meats (liver, kidney). What side effects may I notice from receiving this medication? Side effects that you should report to your care team as soon as possible: Allergic reactions--skin rash, itching, hives, swelling of the face,   lips, tongue, or throat Low blood pressure--dizziness, feeling faint or lightheaded, blurry vision Shortness of breath Side effects that usually do not require medical attention (report to your care team if they continue or are bothersome): Flushing Headache Joint pain Muscle pain Nausea Pain, redness, or irritation at injection site This list may not describe all possible side effects. Call your doctor for medical  advice about side effects. You may report side effects to FDA at 1-800-FDA-1088. Where should I keep my medication? This medication is given in a hospital or clinic and will not be stored at home. NOTE: This sheet is a summary. It may not cover all possible information. If you have questions about this medicine, talk to your doctor, pharmacist, or health care provider.  2024 Elsevier/Gold Standard (2022-04-09 00:00:00)    To help prevent nausea and vomiting after your treatment, we encourage you to take your nausea medication as directed.  BELOW ARE SYMPTOMS THAT SHOULD BE REPORTED IMMEDIATELY: *FEVER GREATER THAN 100.4 F (38 C) OR HIGHER *CHILLS OR SWEATING *NAUSEA AND VOMITING THAT IS NOT CONTROLLED WITH YOUR NAUSEA MEDICATION *UNUSUAL SHORTNESS OF BREATH *UNUSUAL BRUISING OR BLEEDING *URINARY PROBLEMS (pain or burning when urinating, or frequent urination) *BOWEL PROBLEMS (unusual diarrhea, constipation, pain near the anus) TENDERNESS IN MOUTH AND THROAT WITH OR WITHOUT PRESENCE OF ULCERS (sore throat, sores in mouth, or a toothache) UNUSUAL RASH, SWELLING OR PAIN  UNUSUAL VAGINAL DISCHARGE OR ITCHING   Items with * indicate a potential emergency and should be followed up as soon as possible or go to the Emergency Department if any problems should occur.  Please show the CHEMOTHERAPY ALERT CARD or IMMUNOTHERAPY ALERT CARD at check-in to the Emergency Department and triage nurse.  Should you have questions after your visit or need to cancel or reschedule your appointment, please contact [facility] [phone]  and follow the prompts.  Office hours are 8:00 a.m. to 4:30 p.m. Monday - Friday. Please note that voicemails left after 4:00 p.m. may not be returned until the following business day.  We are closed weekends and major holidays. You have access to a nurse at all times for urgent questions. Please call the main number to the clinic [phone] and follow the  prompts.  For any non-urgent questions, you may also contact your provider using MyChart. We now offer e-Visits for anyone 18 and older to request care online for non-urgent symptoms. For details visit [email or web address].   Also download the MyChart app! Go to the app store, search "MyChart", open the app, select [facility], and log in with your MyChart username and password.   

## 2023-04-25 NOTE — Progress Notes (Signed)
[facility] - Clinic Note  05/03/2023    CHIEF COMPLAINT Patient presents for Retina Follow Up  HISTORY OF PRESENT ILLNESS: Lucas Dudley is a 82 y.o. male who presents to the clinic today for:  HPI     Retina Follow Up   Patient presents with  Diabetic Retinopathy.  In both eyes.  This started 5 weeks ago.  I, the attending physician,  performed the HPI with the patient and updated documentation appropriately.        Comments   Patient here for 5 weeks retina follow up for NPDR OU. Patient states vision fairly well. OS been watering lately. No eye pain. Using pressure drop blue top BID OU. And saline prn.      Last edited by Rennis Chris, MD on 05/03/2023  4:19 PM.    Pt states his left eye is watering a lot   Referring physician: Gareth Morgan, MD [address] [place],  [place] [address]  HISTORICAL INFORMATION:   Selected notes from the MEDICAL RECORD NUMBER Referred by Dr. Alben Spittle for DEE   CURRENT MEDICATIONS: Current Outpatient Medications (Ophthalmic Drugs)  Medication Sig   Artificial Tear Ointment (DRY EYES OP) Place 1 drop into both eyes daily as needed (Dry eye).   COMBIGAN 0.2-0.5 % ophthalmic solution Place 1 drop into both eyes 2 (two) times daily.   No current facility-administered medications for this visit. (Ophthalmic Drugs)   Current Outpatient Medications (Other)  Medication Sig   acetaminophen (TYLENOL) 650 MG CR tablet Take 1,300 mg by mouth every 8 (eight) hours as needed for pain.   albuterol (VENTOLIN HFA) 108 (90 Base) MCG/ACT inhaler Inhale 2 puffs into the lungs every 4 (four) hours as needed for wheezing or shortness of breath.   amiodarone (PACERONE) 200 MG tablet TAKE 1 TABLET BY MOUTH EVERY DAY   Ascorbic Acid (VITAMIN C PO) Take 500 mg by mouth daily.   aspirin 81 MG chewable tablet Chew 81 mg by mouth daily.   camphor-menthol (ANTI-ITCH) lotion Apply 1 Application topically daily as needed for itching.    carvedilol (COREG) 3.125 MG tablet TAKE 1 TABLET BY MOUTH 2 TIMES DAILY.   CRANBERRY PO Take 1 tablet by mouth daily.   cyanocobalamin (VITAMIN B12) 1000 MCG tablet Take 1 tablet (1,000 mcg total) by mouth daily.   docusate sodium (COLACE) 100 MG capsule Take 1 capsule (100 mg total) by mouth daily.   ELDERBERRY PO Take 2 tablets by mouth daily. Chewable   gemfibrozil (LOPID) 600 MG tablet Take 600 mg by mouth at bedtime.    glimepiride (AMARYL) 2 MG tablet Take 2 mg by mouth daily in the afternoon.   Insulin Glargine (BASAGLAR KWIKPEN) 100 UNIT/ML Inject 34 Units into the skin at bedtime.   KLOR-CON M20 20 MEQ tablet TAKE 1 TABLET BY MOUTH EVERY DAY   lisinopril (ZESTRIL) 20 MG tablet TAKE 1 TABLET BY MOUTH EVERY DAY (Patient taking differently: Take 20 mg by mouth in the morning and at bedtime.)   magnesium gluconate (MAGONATE) 500 MG tablet Take 500 mg by mouth daily.   metFORMIN (GLUCOPHAGE-XR) 500 MG 24 hr tablet Take 1,000 mg by mouth in the morning and at bedtime.   mineral oil liquid Take 15 mLs by mouth daily as needed for moderate constipation.   Multiple Vitamins-Minerals (MULTIVITAMIN WITH MINERALS) tablet Take 1 tablet by mouth daily. Centrum men 50+   Omega-3 1000 MG CAPS Take 1,000 mg by mouth daily.  OVER THE COUNTER MEDICATION Take 1 tablet by mouth daily. Beets Chew   pantoprazole (PROTONIX) 40 MG tablet Take 1 tablet (40 mg total) by mouth 2 (two) times daily before a meal. Twice daily for 1 month then may decrease back to once daily. (Patient taking differently: Take 40 mg by mouth daily. Twice daily for 1 month then may decrease back to once daily.)   torsemide (DEMADEX) 20 MG tablet TAKE 1 TABLET BY MOUTH TWICE A DAY   triamcinolone cream (KENALOG) 0.1 % Apply 1 Application topically 2 (two) times daily as needed (itch).   valACYclovir (VALTREX) 1000 MG tablet Take 1,000 mg by mouth at bedtime.   ferrous sulfate 325 (65 FE) MG tablet Take 1 tablet (325 mg total) by mouth  daily with breakfast.   Current Facility-Administered Medications (Other)  Medication Route   0.9 %  sodium chloride infusion Intravenous   0.9 %  sodium chloride infusion Intravenous   REVIEW OF SYSTEMS: ROS   Positive for: Gastrointestinal, Endocrine, Cardiovascular, Eyes, Respiratory Negative for: Constitutional, Neurological, Skin, Genitourinary, Musculoskeletal, HENT, Psychiatric, Allergic/Imm, Heme/Lymph Last edited by Laddie Aquas, COA on 05/03/2023  1:49 PM.        ALLERGIES Allergies  Allergen Reactions   Azithromycin Nausea Only   Tape Rash and Other (See Comments)    Causes skin redness, Use paper tape only.   PAST MEDICAL HISTORY Past Medical History:  Diagnosis Date   Arthritis    Atrial fibrillation (HCC)    CAD (coronary artery disease)    a. Cath 03/17/15 showing 100% ostial D1, 50% prox LAD to mid LAD, 40% RPDA stenosis. Med rx. // Myoview 01/2020: EF 31 diffuse perfusion defect without reversibility (suspect artifact); reviewed with Dr. Tama High study felt to be low risk   Cataract    Mixed form OD   Chronic diastolic CHF (congestive heart failure) (HCC)    Diabetic retinopathy (HCC)    NPDR OU   Essential hypertension    Glaucoma    POAG OU   History of gout    Hyperlipidemia    Hypertensive retinopathy    OU   Kidney stones    Melanoma of neck (HCC)    NICM (nonischemic cardiomyopathy) (HCC)    OSA on CPAP 2012   Prostate cancer (HCC)    Type II diabetes mellitus (HCC)    Past Surgical History:  Procedure Laterality Date   ABDOMINAL HERNIA REPAIR     w/mesh   ATRIAL FIBRILLATION ABLATION N/A 06/06/2021   Procedure: ATRIAL FIBRILLATION ABLATION;  Surgeon: Hillis Range, MD;  Location: [facility] INVASIVE CV LAB;  Service: Cardiovascular;  Laterality: N/A;   BIOPSY  05/24/2022   Procedure: BIOPSY;  Surgeon: Corbin Ade, MD;  Location: [facility];  Service: Endoscopy;;   BIOPSY  05/30/2022   Procedure: BIOPSY;  Surgeon: Corbin Ade,  MD;  Location: [facility];  Service: Endoscopy;;   BIOPSY  01/10/2023   Procedure: BIOPSY;  Surgeon: Lemar Lofty., MD;  Location: [facility];  Service: Gastroenterology;;   CARDIAC CATHETERIZATION N/A 03/17/2015   Procedure: Left Heart Cath and Coronary Angiography;  Surgeon: Runell Gess, MD;  Location: [facility] INVASIVE CV LAB;  Service: Cardiovascular;  Laterality: N/A;   CARDIOVERSION N/A 08/09/2021   Procedure: CARDIOVERSION;  Surgeon: Chilton Si, MD;  Location: [facility] ENDOSCOPY;  Service: Cardiovascular;  Laterality: N/A;   CARDIOVERSION N/A 11/10/2021   Procedure: CARDIOVERSION;  Surgeon: Thomasene Ripple, DO;  Location: [facility] ENDOSCOPY;  Service: Cardiovascular;  Laterality: N/A;   carotid doppler  09/17/2008   rigt and left ICAs 0-49%;mildly  abnormal   CATARACT EXTRACTION Left 2020   Dr. Alben Spittle   COLONOSCOPY N/A 05/16/2017   Procedure: COLONOSCOPY;  Surgeon: Malissa Hippo, MD;  Location: [facility];  Service: Endoscopy;  Laterality: N/A;  930   COLONOSCOPY WITH PROPOFOL N/A 05/24/2022   Procedure: COLONOSCOPY WITH PROPOFOL;  Surgeon: Corbin Ade, MD;  [facility]: [facility];  Service: Endoscopy;  Laterality: N/A;   DOPPLER ECHOCARDIOGRAPHY  05/25/2009   EF 50-55%,LA mildly dilated, LV function normal   ELECTROPHYSIOLOGIC STUDY N/A 04/05/2015   Procedure: Cardioversion;  Surgeon: Thurmon Fair, MD;  Location: [facility];  Service: Cardiovascular;  Laterality: N/A;   ELECTROPHYSIOLOGIC STUDY N/A 09/06/2015   Procedure: Atrial Fibrillation Ablation;  Surgeon: Hillis Range, MD;  Location: [facility] INVASIVE CV LAB;  Service: Cardiovascular;  Laterality: N/A;   ELECTROPHYSIOLOGIC STUDY N/A 07/12/2016   redo afib ablation by Dr Johney Frame   ENTEROSCOPY N/A 05/30/2022   Procedure: ENTEROSCOPY;  Surgeon: Corbin Ade, MD;  [facility]: [facility];  Service: Endoscopy;  Laterality: N/A;   ESOPHAGOGASTRODUODENOSCOPY (EGD) WITH PROPOFOL N/A 05/24/2022   Procedure:  ESOPHAGOGASTRODUODENOSCOPY (EGD) WITH PROPOFOL;  Surgeon: Corbin Ade, MD;  [facility]: [facility];  Service: Endoscopy;  Laterality: N/A;   ESOPHAGOGASTRODUODENOSCOPY (EGD) WITH PROPOFOL N/A 01/10/2023   Procedure: ESOPHAGOGASTRODUODENOSCOPY (EGD) WITH PROPOFOL;  Surgeon: Meridee Score Netty Starring., MD;  Location: [facility] ENDOSCOPY;  Service: Gastroenterology;  Laterality: N/A;   EUS N/A 01/10/2023   Procedure: UPPER ENDOSCOPIC ULTRASOUND (EUS) RADIAL;  Surgeon: Lemar Lofty., MD;  Location: [facility]OS[facility];  Service: Gastroenterology;  Laterality: N/A;   EXCISIONAL HEMORRHOIDECTOMY     "inside and out"   EYE SURGERY Left 2020   Cat Sx - Dr. Alben Spittle   FINE NEEDLE ASPIRATION Right    knee; "drew ~ 1 quart off"   GIVENS CAPSULE STUDY N/A 05/25/2022   Procedure: GIVENS CAPSULE STUDY;  Surgeon: Lanelle Bal, DO;  Location: [facility];  Service: Endoscopy;  Laterality: N/A;   GIVENS CAPSULE STUDY N/A 01/23/2023   Procedure: GIVENS CAPSULE STUDY;  Surgeon: Dolores Frame, MD;  Location: [facility];  Service: Gastroenterology;  Laterality: N/A;  8:30am   HERNIA REPAIR     HOT HEMOSTASIS N/A 01/10/2023   Procedure: HOT HEMOSTASIS (ARGON PLASMA COAGULATION/BICAP);  Surgeon: Lemar Lofty., MD;  Location: [facility];  Service: Gastroenterology;  Laterality: N/A;   LAPAROSCOPIC CHOLECYSTECTOMY     LEFT ATRIAL APPENDAGE OCCLUSION N/A 06/28/2022   Procedure: LEFT ATRIAL APPENDAGE OCCLUSION;  Surgeon: Tonny Bollman, MD;  Location: [facility] INVASIVE CV LAB;  Service: Cardiovascular;  Laterality: N/A;   MELANOMA EXCISION Right    "neck"   NM MYOCAR PERF WALL MOTION  02/21/2012   EF 61% ,EXERCISE 7 METS. exercise stopped due to wheezing and shortness of breathe   POLYPECTOMY  05/16/2017   Procedure: POLYPECTOMY;  Surgeon: Malissa Hippo, MD;  [facility]: [facility];  Service: Endoscopy;;  colon   POLYPECTOMY  05/24/2022   Procedure: POLYPECTOMY;  Surgeon: Corbin Ade, MD;   [facility];  Service: Endoscopy;;   POLYPECTOMY  01/10/2023   Procedure: POLYPECTOMY;  Surgeon: Meridee Score Netty Starring., MD;  Location: [facility];  Service: Gastroenterology;;   PROSTATECTOMY     SHOULDER OPEN ROTATOR CUFF REPAIR Right X 2   SUBMUCOSAL TATTOO INJECTION  05/30/2022   Procedure: SUBMUCOSAL TATTOO INJECTION;  Surgeon: Jena Gauss,  Gerrit Friends, MD;  Location: [facility];  Service: Endoscopy;;   SUBMUCOSAL TATTOO INJECTION  01/10/2023   Procedure: SUBMUCOSAL TATTOO INJECTION;  Surgeon: Lemar Lofty., MD;  Location: [facility];  Service: Gastroenterology;;   TEE WITHOUT CARDIOVERSION N/A 09/05/2015   Procedure: TRANSESOPHAGEAL ECHOCARDIOGRAM (TEE);  Surgeon: Quintella Reichert, MD;  Location: [facility] ENDOSCOPY;  Service: Cardiovascular;  Laterality: N/A;   TEE WITHOUT CARDIOVERSION N/A 06/28/2022   Procedure: TRANSESOPHAGEAL ECHOCARDIOGRAM (TEE);  Surgeon: Tonny Bollman, MD;  Location: [facility] INVASIVE CV LAB;  Service: Cardiovascular;  Laterality: N/A;   FAMILY HISTORY Family History  Problem Relation Age of Onset   Heart disease Mother    Lung cancer Mother    Heart attack Mother 86   Diabetes Father    Heart disease Father    Stroke Brother    Healthy Daughter    Colon cancer Maternal Aunt        45s   SOCIAL HISTORY Social History   Tobacco Use   Smoking status: Former    Current packs/day: 0.00    Average packs/day: 2.0 packs/day for 27.0 years (54.0 ttl pk-yrs)    Types: Cigarettes    Start date: 47    Quit date: 1992    Years since quitting: 32.5    Passive exposure: Current   Smokeless tobacco: Never   Tobacco comments:    Former smoker 09/27/21  Vaping Use   Vaping status: Never Used  Substance Use Topics   Alcohol use: No    Alcohol/week: 0.0 standard drinks of alcohol    Comment: "used to drink; stopped ~ 2008"   Drug use: No       OPHTHALMIC EXAM: Base Eye Exam     Visual Acuity (Snellen - Linear)       Right Left   Dist cc  20/25 -1 20/20 -2   Dist ph cc 20/25 +1     Correction: Glasses         Tonometry (Tonopen, 1:47 PM)       Right Left   Pressure 20 17         Pupils       Dark Light Shape React APD   Right 2 1 Round Brisk None   Left 2 1 Round Brisk None         Visual Fields (Counting fingers)       Left Right    Full Full         Extraocular Movement       Right Left    Full, Ortho Full, Ortho         Neuro/Psych     Oriented x3: Yes   Mood/Affect: Normal         Dilation     Both eyes: 1.0% Mydriacyl, 2.5% Phenylephrine @ 1:46 PM           Slit Lamp and Fundus Exam     Slit Lamp Exam       Right Left   Lids/Lashes Dermato, mild MGD Dermato, mild MGD   Conjunctiva/Sclera Temporal pinguecula, mild inferior sub conj heme Temporal pinguecula   Cornea EBMD, mild haze, trace PEE, mild Debris in tear film, well healed cataract wound trace haze, trace PEE, well healed temporal cataract wounds, arcus   Anterior Chamber Deep and clear; narrow temporal angle Deep and clear   Iris Round and dilated, mild anterior bowing, No NVI Round and moderately dilated to 5.15mm   Lens PCIOL in good position,  trace PCO PC IOL in good position with open PC   Anterior Vitreous Synerisis Synerisis         Fundus Exam       Right Left   Disc Mild pallor, sharp rim, +cupping, thin inferior rim Mild pallor, sharp rim, +cupping, +PPA   C/D Ratio 0.7 0.6   Macula Flat, blunted foveal reflex, cystic changes - slightly improved, RPE mottling and clumping, no heme, Drusen, punctate Exudates -- slightly improved Flat, good foveal reflex, no heme, cystic changes -- stably improved   Vessels mild attenuation, mild tortuosity Attenuated, mild av crossing changes, mild tortuousity   Periphery Attached, rare MA, forcal DBH nasal to disc--improved Attached. No heme.           Refraction     Wearing Rx       Sphere Cylinder Axis Add   Right -0.25 +2.00 158 +2.50   Left -1.00  +1.25 008 +2.50           IMAGING AND PROCEDURES  Imaging and Procedures for 05/03/2023  OCT, Retina - OU - Both Eyes       Right Eye Quality was good. Central Foveal Thickness: 336. Progression has improved. Findings include no SRF, abnormal foveal contour, intraretinal fluid, vitreomacular adhesion (Persistent IRF/IRHM -- slightly improved, partial PVD).   Left Eye Quality was good. Central Foveal Thickness: 274. Progression has been stable. Findings include normal foveal contour, no IRF, no SRF, intraretinal hyper-reflective material (stable improvement in IRF/cystic changes centrally; punctate IRHM; partial PVD).   Notes *Images captured and stored on drive  Diagnosis / Impression:  +DME OU OD: persistent IRF/IRHM and foveal contour -- slightly improved, partial PVD OS: stable improvement in IRF/cystic changes centrally; punctate IRHM; partial PVD  Clinical management:  See below  Abbreviations: NFP - Normal foveal profile. CME - cystoid macular edema. PED - pigment epithelial detachment. IRF - intraretinal fluid. SRF - subretinal fluid. EZ - ellipsoid zone. ERM - epiretinal membrane. ORA - outer retinal atrophy. ORT - outer retinal tubulation. SRHM - subretinal hyper-reflective material. IRHM - intraretinal hyper-reflective material     Intravitreal Injection, Pharmacologic Agent - OD - Right Eye       Time Out 05/03/2023. 2:32 PM. Confirmed correct patient, procedure, site, and patient consented.   Anesthesia Topical anesthesia was used. Anesthetic medications included Lidocaine 2%, Proparacaine 0.5%.   Procedure Preparation included 5% betadine to ocular surface, eyelid speculum. A (32g) needle was used.   Injection: 6 mg faricimab-svoa 6 MG/0.05ML   Route: Intravitreal, Site: Right Eye   NDC: O8010301, Lot: [id number], Expiration date: 04/13/2025, Waste: 0 mL   Post-op Post injection exam found visual acuity of at least counting fingers. The patient  tolerated the procedure well. There were no complications. The patient received written and verbal post procedure care education.             ASSESSMENT/PLAN:   ICD-10-CM   1. 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 Current use of insulin (HCC)  Z79.4     3. Long term (current) use of oral hypoglycemic drugs  Z79.84     4. Essential hypertension  I10     5. Hypertensive retinopathy of both eyes  H35.033  6. Pseudophakia of both eyes  Z96.1     7. 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 History of herpes zoster of eye  Z86.19     9. Primary open angle glaucoma of both eyes, unspecified glaucoma stage  H40.1130       1-3. 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 Single focal MA superior to fovea; OS: Perifoveal Mas w/ late leakage - s/p IVA OS # 1(07.26.21), #2 (08.24.21), #3 (09.21.21), #4 (10.22.21) -- IVA resistance ============================================================= - s/p IVE OD #1 (09.29.23), #2 (10.30.23), #3 (SAMPLE) (11.27.23) #4 (12.29.23) #5 (01.26.24) #6 (02.23.24) -- IVE resistance - s/p IVE OS #1 (11.22.21), #2 (12.21.21), #3 (01.20.22), #4 (02.18.22), #5 (03.18.22), #6 (04.15.22), #7 (05.10.22) -- sample, #8 (06.08.22), #9 (07.12.22), #10 (08.12.22), #11 (09.16.22), #12 (10.21.22), #13 (11.18.22), #14 (12.21.22), #15 (01.20.23), #16 (02.17.23), #17 (03.24.23), #18 (04.21.23), #19 (05.26.23), #20 (06.23.23) -- IVE resistance ============================================================= - s/p IVV OD #1 (03.22.24), #2 (04.19.24), #3 (5.17.24) #4 (6.18.24) - s/p IVV OS #1 (07.21.23), #2 (08.25.23), #3 (09.29.23), #4 (10.30.23),  #5 (11.27.23) #6 (12.29.23), #7 (01.26.23), #8 (02.23.24), #9 (03.22.24), #10(04.19.24), #11 (05.17.24) - BCVA OD 20/25 from 20/20; OS 20/20 - stable - OCT shows OD: persistent IRF/IRHM and foveal contour -- slightly improved, partial PVD; OS: stable improvement in IRF/cystic changes centrally; punctate IRHM; partial PVD at 5 weeks - recommend IVV OD #5  today, 07.19.24 with f/u back to 4-5 weeks - recommend holding injection OS today -- pt in agreement  - pt wishes to proceed with injection OD  - RBA of procedure discussed, questions answered  - Vabysmo informed consent obtained and signed, 03.22.24 (OD)  - Vabysmo informed consent obtained and signed, 07.21.23 (OS) - Eylea approved until 10/04/23 - [employer] approved Vabysmo - see procedure note - f/u 4-5 weeks -- DFE/OCT, possible injection(s)  4,5. Hypertensive retinopathy OU - discussed importance of tight BP control - continue to monitor  6. Pseudophakia OU  - s/p CE/IOL OS (2020, Dr. Alben Spittle)             - s/p CE/IOL OD (2022, Dr. Alben Spittle)  - s/p YAG cap OS (04.04.23) - BCVA 20/30 OD and 20/25 OS from 20/150  - IOLs in good position, doing well  - continue to monitor  7. 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 Valere Dross in August 2022  - s/p SuperK OS on 12.12.2022  8. History of herpes zoster/iritis OS  - on Valtrex 1 g daily---maintenance  9. POAG OU  - was under the expert management of Dr. Alben Spittle, now follows with Dr. Zenaida Niece  - IOP 20,17  - continue Cosopt bid OU  Ophthalmic Meds Ordered this visit:  Meds ordered this encounter  Medications   faricimab-svoa (VABYSMO) 6mg /0.58mL intravitreal injection     This document serves as a record of services personally performed by Karie Chimera, MD, PhD. It was created on their behalf by Berlin Hun COT, an ophthalmic technician. The creation of this record is the provider's dictation and/or activities during the visit.    Electronically signed by: Berlin Hun COT 07.11.24 4:20 PM  This document serves as a record of services personally performed by Karie Chimera, MD, PhD. It was created on their behalf by Glee Arvin. Manson Passey, OA an ophthalmic technician. The creation of this record is the provider's dictation and/or activities during the visit.    Electronically signed  by: Glee Arvin Manson Passey, OA 05/03/23 4:20 PM  Karie Chimera, M.D., Ph.D. Diseases & Surgery of the Retina and Vitreous [facility] 05/03/2023   I have reviewed the above documentation for accuracy and completeness, and I agree with the above. Karie Chimera, M.D., Ph.D. 05/03/23 4:21 PM   Abbreviations: M myopia (nearsighted); A astigmatism; H hyperopia (farsighted); P presbyopia; Mrx spectacle prescription;  CTL contact lenses; OD right eye; OS left eye; OU both eyes  XT exotropia; ET esotropia; PEK punctate epithelial keratitis; PEE punctate epithelial erosions; DES dry eye syndrome; MGD meibomian gland dysfunction; ATs artificial tears; PFAT's preservative free artificial tears; NSC nuclear sclerotic cataract; PSC posterior subcapsular cataract; ERM epi-retinal membrane; PVD posterior vitreous detachment; RD retinal detachment; DM diabetes mellitus; DR diabetic retinopathy; NPDR non-proliferative diabetic retinopathy; PDR proliferative diabetic retinopathy; CSME clinically significant macular edema; DME diabetic macular edema; dbh dot blot hemorrhages; CWS cotton wool spot; POAG primary open angle glaucoma; C/D cup-to-disc ratio; HVF humphrey visual field; GVF goldmann visual field; OCT optical coherence tomography; IOP intraocular pressure; BRVO Branch retinal vein occlusion; CRVO central retinal vein occlusion; CRAO central retinal artery occlusion; BRAO branch retinal artery occlusion; RT retinal tear; SB scleral buckle; PPV pars plana vitrectomy; VH Vitreous hemorrhage; PRP panretinal laser photocoagulation; IVK intravitreal kenalog; VMT vitreomacular  traction; MH Macular hole;  NVD neovascularization of the disc; NVE neovascularization elsewhere; AREDS age related eye disease study; ARMD age related macular degeneration; POAG primary open angle glaucoma; EBMD epithelial/anterior basement membrane dystrophy; ACIOL anterior chamber intraocular lens; IOL intraocular lens; PCIOL posterior chamber intraocular lens; Phaco/IOL phacoemulsification with intraocular lens placement; PRK photorefractive keratectomy; LASIK laser assisted in situ keratomileusis; HTN hypertension; DM diabetes mellitus; COPD chronic obstructive pulmonary disease

## 2023-05-03 ENCOUNTER — Encounter (INDEPENDENT_AMBULATORY_CARE_PROVIDER_SITE_OTHER): Payer: Self-pay | Admitting: Ophthalmology

## 2023-05-03 ENCOUNTER — Ambulatory Visit (INDEPENDENT_AMBULATORY_CARE_PROVIDER_SITE_OTHER): Payer: Medicare Other | Admitting: Ophthalmology

## 2023-05-03 DIAGNOSIS — I10 Essential (primary) hypertension: Secondary | ICD-10-CM

## 2023-05-03 DIAGNOSIS — H401130 Primary open-angle glaucoma, bilateral, stage unspecified: Secondary | ICD-10-CM

## 2023-05-03 DIAGNOSIS — H35033 Hypertensive retinopathy, bilateral: Secondary | ICD-10-CM

## 2023-05-03 DIAGNOSIS — H18523 Epithelial (juvenile) corneal dystrophy, bilateral: Secondary | ICD-10-CM

## 2023-05-03 DIAGNOSIS — Z7984 Long term (current) use of oral hypoglycemic drugs: Secondary | ICD-10-CM

## 2023-05-03 DIAGNOSIS — E113213 Type 2 diabetes mellitus with mild nonproliferative diabetic retinopathy with macular edema, bilateral: Secondary | ICD-10-CM | POA: Diagnosis not present

## 2023-05-03 DIAGNOSIS — Z961 Presence of intraocular lens: Secondary | ICD-10-CM

## 2023-05-03 DIAGNOSIS — Z794 Long term (current) use of insulin: Secondary | ICD-10-CM | POA: Diagnosis not present

## 2023-05-03 DIAGNOSIS — Z8619 Personal history of other infectious and parasitic diseases: Secondary | ICD-10-CM

## 2023-05-03 MED ORDER — FARICIMAB-SVOA 6 MG/0.05ML IZ SOLN
6.0000 mg | INTRAVITREAL | Status: AC | PRN
Start: 2023-05-03 — End: 2023-05-03
  Administered 2023-05-03: 6 mg via INTRAVITREAL

## 2023-05-27 ENCOUNTER — Encounter (INDEPENDENT_AMBULATORY_CARE_PROVIDER_SITE_OTHER): Payer: Self-pay | Admitting: Gastroenterology

## 2023-05-27 ENCOUNTER — Ambulatory Visit (INDEPENDENT_AMBULATORY_CARE_PROVIDER_SITE_OTHER): Payer: Medicare Other | Admitting: Gastroenterology

## 2023-05-27 VITALS — BP 144/77 | HR 52 | Temp 97.5°F | Ht 70.0 in | Wt 239.4 lb

## 2023-05-27 DIAGNOSIS — K319 Disease of stomach and duodenum, unspecified: Secondary | ICD-10-CM

## 2023-05-27 DIAGNOSIS — K5520 Angiodysplasia of colon without hemorrhage: Secondary | ICD-10-CM

## 2023-05-27 DIAGNOSIS — K5900 Constipation, unspecified: Secondary | ICD-10-CM

## 2023-05-27 DIAGNOSIS — D500 Iron deficiency anemia secondary to blood loss (chronic): Secondary | ICD-10-CM | POA: Diagnosis not present

## 2023-05-27 NOTE — Progress Notes (Addendum)
Referring Provider: Gareth Morgan, MD Primary Care Physician:  Gareth Morgan, MD Primary GI Physician: Dr. Levon Hedger   Chief Complaint  Patient presents with   Follow-up    Patient here today for a follow up on IDA. Patient denies any sight of blood in stools. Patient says he is having shortness of breath, fatigue, chest pain.  04/04/2023 hgb 13.5 and Fe Sat % was 15. He says he did receive IV Fe after. He is taking an oral fe pill daily.   HPI:   Lucas Dudley is a 82 y.o. male with past medical history of atrial fibrillation s/p watchman procedure now on chronic ASA, systolic CHF, hypertension, diabetes   Patient presenting today for follow up of IDA, constipation and duodenal/jejunal lesion  Last seen February 2024, at that time reporting intermittent fatigue, sob. No rectal bleeding or melena. Taking protonix 40mg  daily. Hgb 11.9 iron 68, TIBC 391, ferritin 33, sat 17% in November 2023, on PO iron and B12 supplements. Having 1 BM every other day, using small amount of miralax. Had EUS scheduled with Dr Meridee Score in march 2024.   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 He had 2 iron infusions last month. He feels sometimes that the way he sits causes his stomach to push up and make breathing difficult. He states appetite is about the same, is not really hungry. He has not lost any weight. No rectal bleeding or melena. No nausea or vomiting. Constipation is well managed with  miralax and stools softener, he feels he is going to the restroom easily with this combination. He is still taking one Iron pill per day, hematology had advised him to continue with this. He Is still taking protonix 40mg  daily.   He notes that he has hemorrhoids that he can push back in, occasionally will see some blood. Notes no pain, itching burning. He is not using any cream for these. He has past history of hemorrhoidectomy in the past that he notes did not improve much for him. He again declines rectal exam today.   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 Z-line irregular, 41 cm from the incisors. - 1 cm hiatal hernia.  - Congested and nodular mucosa in the antrum. Biopsied.- A single duodenal polyp in the bulb.  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 Treated with APC  - 2 tattoos were seen in the proximal jejunum. - Within the 2 tattoos, a subepithelial lesion was  noted. 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 Treated with APC - Normal mucosa was found in the rest of the visualized proximal jejunum. Tattooed the distal  extent of today's SBE.  EUS impression: - An intramural (subepithelial) lesion was found. The lesion appeared to originate from within the  deep mucosa (Layer 2). Tissue has not been obtained. However, the endosonographic appearance  could be concerning for a  duplication cyst versus  leiomyoma versus GIST.  Path: dudoenal mucosa with prominent brunners gland and focal foveolar metaplasia-chronic peptic duodenitis  STOMACH, ANTRUM, BIOPSY:  -  Predominantly antral type mucosa with features of both  chemical/reactive gastropathy with focal erosion and moderate chronic  focally active gastritis.  -  An immunohistochemical stain for Helicobacter pylori organisms is  negative.  - An immunohistochemical stain (CK AE1/AE3 is performed and  highlights  surface epithelium.   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 Status post ablation. Small bowel nodule -junction distal duodenum/jejunum. Irregular with a submucosal component. Suspicious in appearance. 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  No active bleeding.  Capsule did not reach cecum. Last Colonoscopy:05/24/22- One 5 mm polyp in the sigmoid colon-hyperplastic - Diverticulosis in the descending colon and at the splenic flexure. Redundant and elongated colon. - The examination was otherwise normal on direct and retroflexion views. No blood or clot in the lower GI tract. Given negative evaluation today, small bowel evaluation warranted. Last EGD 05/24/22- Normal esophagus. - Small hiatal hernia. Friable gastric mucosa. Couple of tiny gastric erosions the significance of which is uncertain. Status post gastric biopsy - Normal duodenal bulb, second portion of the duodenum and third portion of the duodenum.   Past Medical History:  Diagnosis Date   Arthritis    Atrial fibrillation (HCC)    CAD (coronary artery disease)    a. Cath 03/17/15 showing 100% ostial D1, 50% prox LAD to mid LAD, 40% RPDA stenosis. Med rx. // Myoview 01/2020: EF 31 diffuse perfusion defect without reversibility (suspect artifact); reviewed with Dr. Tama High study felt to be low risk   Cataract    Mixed form OD   Chronic diastolic CHF (congestive heart failure) (HCC)    Diabetic retinopathy (HCC)    NPDR OU   Essential hypertension    Glaucoma    POAG OU   History of gout    Hyperlipidemia    Hypertensive retinopathy    OU   Kidney stones    Melanoma of neck (HCC)    NICM (nonischemic cardiomyopathy) (HCC)    OSA on CPAP 2012   Prostate cancer (HCC)    Type II diabetes mellitus (HCC)     Past Surgical History:  Procedure Laterality Date    ABDOMINAL HERNIA REPAIR     w/mesh   ATRIAL FIBRILLATION ABLATION N/A 06/06/2021   Procedure: ATRIAL FIBRILLATION ABLATION;  Surgeon: Hillis Range, MD;  Location: [facility];  Service: Cardiovascular;  Laterality: N/A;   BIOPSY  05/24/2022   Procedure: BIOPSY;  Surgeon: Corbin Ade, MD;  [facility]: [facility];  Service: Endoscopy;;   BIOPSY  05/30/2022   Procedure: BIOPSY;  Surgeon: Corbin Ade, MD;  [facility]: [facility];  Service: Endoscopy;;   BIOPSY  01/10/2023   Procedure: BIOPSY;  Surgeon: Lemar Lofty., MD;  Location: [facility];  Service: Gastroenterology;;   CARDIAC CATHETERIZATION N/A 03/17/2015   Procedure: Left Heart Cath and Coronary Angiography;  Surgeon: Runell Gess, MD;  Location[facility] INVASIVE CV LAB;  Service: Cardiovascular;  Laterality: N/A;   CARDIOVERSION N/A 08/09/2021   Procedure: CARDIOVERSION;  Surgeon: Chilton Si, MD;  Location: [facility] ENDOSCOPY;  Service: Cardiovascular;  Laterality: N/A;   CARDIOVERSION N/A 11/10/2021   Procedure: CARDIOVERSION;  Surgeon: Thomasene Ripple, DO;  Location: [facility] ENDOSCOPY;  Service: Cardiovascular;  Laterality: N/A;   carotid  doppler  09/17/2008   rigt and left ICAs 0-49%;mildly  abnormal   CATARACT EXTRACTION Left 2020   Dr. Alben Spittle   COLONOSCOPY N/A 05/16/2017   Procedure: COLONOSCOPY;  Surgeon: Malissa Hippo, MD;  [facility]: [facility];  Service: Endoscopy;  Laterality: N/A;  930   COLONOSCOPY WITH PROPOFOL N/A 05/24/2022   Procedure: COLONOSCOPY WITH PROPOFOL;  Surgeon: Corbin Ade, MD;  [facility];  Service: Endoscopy;  Laterality: N/A;   DOPPLER ECHOCARDIOGRAPHY  05/25/2009   EF 50-55%,LA mildly dilated, LV function normal   ELECTROPHYSIOLOGIC STUDY N/A 04/05/2015   Procedure: Cardioversion;  Surgeon: Thurmon Fair, MD;  Location: [facility];  Service: Cardiovascular;  Laterality: N/A;   ELECTROPHYSIOLOGIC STUDY N/A 09/06/2015   Procedure: Atrial Fibrillation Ablation;   Surgeon: Hillis Range, MD;  Location: [facility] INVASIVE CV LAB;  Service: Cardiovascular;  Laterality: N/A;   ELECTROPHYSIOLOGIC STUDY N/A 07/12/2016   redo afib ablation by Dr Johney Frame   ENTEROSCOPY N/A 05/30/2022   Procedure: ENTEROSCOPY;  Surgeon: Corbin Ade, MD;  [facility];  Service: Endoscopy;  Laterality: N/A;   ESOPHAGOGASTRODUODENOSCOPY (EGD) WITH PROPOFOL N/A 05/24/2022   Procedure: ESOPHAGOGASTRODUODENOSCOPY (EGD) WITH PROPOFOL;  Surgeon: Corbin Ade, MD;  [facility];  Service: Endoscopy;  Laterality: N/A;   ESOPHAGOGASTRODUODENOSCOPY (EGD) WITH PROPOFOL N/A 01/10/2023   Procedure: ESOPHAGOGASTRODUODENOSCOPY (EGD) WITH PROPOFOL;  Surgeon: Meridee Score Netty Starring., MD;  Location: [facility] ENDOSCOPY;  Service: Gastroenterology;  Laterality: N/A;   EUS N/A 01/10/2023   Procedure: UPPER ENDOSCOPIC ULTRASOUND (EUS) RADIAL;  Surgeon: Lemar Lofty., MD;  Location: [facility] ENDOSCOPY;  Service: Gastroenterology;  Laterality: N/A;   EXCISIONAL HEMORRHOIDECTOMY     "inside and out"   EYE SURGERY Left 2020   Cat Sx - Dr. Alben Spittle   FINE NEEDLE ASPIRATION Right    knee; "drew ~ 1 quart off"   GIVENS CAPSULE STUDY N/A 05/25/2022   Procedure: GIVENS CAPSULE STUDY;  Surgeon: Lanelle Bal, DO;  [facility];  Service: Endoscopy;  Laterality: N/A;   GIVENS CAPSULE STUDY N/A 01/23/2023   Procedure: GIVENS CAPSULE STUDY;  Surgeon: Dolores Frame, MD;  [facility];  Service: Gastroenterology;  Laterality: N/A;  8:30am   HERNIA REPAIR     HOT HEMOSTASIS N/A 01/10/2023   Procedure: HOT HEMOSTASIS (ARGON PLASMA COAGULATION/BICAP);  Surgeon: Lemar Lofty., MD;  Location: [facility];  Service: Gastroenterology;  Laterality: N/A;   LAPAROSCOPIC CHOLECYSTECTOMY     LEFT ATRIAL APPENDAGE OCCLUSION N/A 06/28/2022   Procedure: LEFT ATRIAL APPENDAGE OCCLUSION;  Surgeon: Tonny Bollman, MD;  Location: [facility] INVASIVE CV LAB;  Service: Cardiovascular;   Laterality: N/A;   MELANOMA EXCISION Right    "neck"   NM MYOCAR PERF WALL MOTION  02/21/2012   EF 61% ,EXERCISE 7 METS. exercise stopped due to wheezing and shortness of breathe   POLYPECTOMY  05/16/2017   Procedure: POLYPECTOMY;  Surgeon: Malissa Hippo, MD;  [facility];  Service: Endoscopy;;  colon   POLYPECTOMY  05/24/2022   Procedure: POLYPECTOMY;  Surgeon: Corbin Ade, MD;  [facility];  Service: Endoscopy;;   POLYPECTOMY  01/10/2023   Procedure: POLYPECTOMY;  Surgeon: Meridee Score Netty Starring., MD;  Location: [facility];  Service: Gastroenterology;;   PROSTATECTOMY     SHOULDER OPEN ROTATOR CUFF REPAIR Right X 2   SUBMUCOSAL TATTOO INJECTION  05/30/2022   Procedure: SUBMUCOSAL TATTOO INJECTION;  Surgeon: Corbin Ade, MD;  [facility];  Service: Endoscopy;;   SUBMUCOSAL TATTOO INJECTION  01/10/2023   Procedure: SUBMUCOSAL TATTOO INJECTION;  Surgeon: Lemar Lofty., MD;  Location: [facility];  Service: Gastroenterology;;   TEE WITHOUT CARDIOVERSION N/A 09/05/2015   Procedure: TRANSESOPHAGEAL ECHOCARDIOGRAM (TEE);  Surgeon: Quintella Reichert, MD;  Location: [facility] ENDOSCOPY;  Service: Cardiovascular;  Laterality: N/A;   TEE WITHOUT CARDIOVERSION N/A 06/28/2022   Procedure: TRANSESOPHAGEAL ECHOCARDIOGRAM (TEE);  Surgeon: Tonny Bollman, MD;  Location: [facility] INVASIVE CV LAB;  Service: Cardiovascular;  Laterality: N/A;    Current Outpatient Medications  Medication Sig Dispense Refill   acetaminophen (TYLENOL) 650 MG CR tablet Take 1,300 mg by mouth every 8 (eight) hours as needed for pain.     albuterol (VENTOLIN HFA) 108 (90 Base) MCG/ACT inhaler Inhale 2 puffs into the lungs every 4 (four) hours as needed for wheezing or shortness of breath.     Artificial Tear Ointment (DRY EYES OP) Place 1 drop into both eyes daily as needed (Dry eye).     Ascorbic Acid (VITAMIN C PO) Take 500 mg by mouth daily.     Ascorbic Acid (VITAMIN C) 100 MG tablet  Take 100 mg by mouth daily.     aspirin 81 MG chewable tablet Chew 81 mg by mouth daily.     BERBERINE CHLORIDE PO Take by mouth daily.     camphor-menthol (ANTI-ITCH) lotion Apply 1 Application topically daily as needed for itching.     carvedilol (COREG) 3.125 MG tablet TAKE 1 TABLET BY MOUTH 2 TIMES DAILY. 180 tablet 3   COMBIGAN 0.2-0.5 % ophthalmic solution Place 1 drop into both eyes 2 (two) times daily.     CRANBERRY PO Take 1 tablet by mouth daily.     cyanocobalamin (VITAMIN B12) 1000 MCG tablet Take 1 tablet (1,000 mcg total) by mouth daily.     docusate sodium (COLACE) 100 MG capsule Take 1 capsule (100 mg total) by mouth daily. 60 capsule 2   ELDERBERRY PO Take 2 tablets by mouth daily. Chewable     ferrous sulfate 325 (65 FE) MG tablet Take 1 tablet (325 mg total) by mouth daily with breakfast. 120 tablet 1   gemfibrozil (LOPID) 600 MG tablet Take 600 mg by mouth at bedtime.      glimepiride (AMARYL) 2 MG tablet Take 2 mg by mouth daily in the afternoon.     Insulin Glargine (BASAGLAR KWIKPEN) 100 UNIT/ML Inject 38 Units into the skin at bedtime.     KLOR-CON M20 20 MEQ tablet TAKE 1 TABLET BY MOUTH EVERY DAY 90 tablet 1   lisinopril (ZESTRIL) 20 MG tablet TAKE 1 TABLET BY MOUTH EVERY DAY (Patient taking differently: Take 20 mg by mouth in the morning and at bedtime.) 90 tablet 1   magnesium gluconate (MAGONATE) 500 MG tablet Take 500 mg by mouth daily.     metFORMIN (GLUCOPHAGE-XR) 500 MG 24 hr tablet Take 1,000 mg by mouth in the morning and at bedtime.     mineral oil liquid Take 15 mLs by mouth daily as needed for moderate constipation.     Multiple Vitamins-Minerals (MULTIVITAMIN WITH MINERALS) tablet Take 1 tablet by mouth daily. Centrum men 50+     Omega-3 1000 MG CAPS Take 1,000 mg by mouth daily.     OVER THE COUNTER MEDICATION Take 1 tablet by mouth daily. Beets Chew     pantoprazole (PROTONIX) 40 MG tablet Take 1 tablet (40 mg total) by mouth 2 (two) times daily  before a  meal. Twice daily for 1 month then may decrease back to once daily. (Patient taking differently: Take 40 mg by mouth daily. Twice daily for 1 month then may decrease back to once daily.) 60 tablet 12   torsemide (DEMADEX) 20 MG tablet TAKE 1 TABLET BY MOUTH TWICE A DAY 180 tablet 1   triamcinolone cream (KENALOG) 0.1 % Apply 1 Application topically 2 (two) times daily as needed (itch).     valACYclovir (VALTREX) 1000 MG tablet Take 1,000 mg by mouth at bedtime.     amiodarone (PACERONE) 200 MG tablet TAKE 1 TABLET BY MOUTH EVERY DAY 90 tablet 1   Current Facility-Administered Medications  Medication Dose Route Frequency Provider Last Rate Last Admin   0.9 %  sodium chloride infusion   Intravenous Continuous Tonny Bollman, MD       0.9 %  sodium chloride infusion   Intravenous Continuous Tonny Bollman, MD        Allergies as of 05/27/2023 - Review Complete 05/27/2023  Allergen Reaction Noted   Azithromycin Nausea Only 11/10/2021   Tape Rash and Other (See Comments) 01/28/2012    Family History  Problem Relation Age of Onset   Heart disease Mother    Lung cancer Mother    Heart attack Mother 38   Diabetes Father    Heart disease Father    Stroke Brother    Healthy Daughter    Colon cancer Maternal Aunt        107s    Social History   Socioeconomic History   Marital status: Widowed    Spouse name: Not on file   Number of children: 1   Years of education: Not on file   Highest education level: Not on file  Occupational History   Occupation: Retired  Tobacco Use   Smoking status: Former    Current packs/day: 0.00    Average packs/day: 2.0 packs/day for 27.0 years (54.0 ttl pk-yrs)    Types: Cigarettes    Start date: 19    Quit date: 1992    Years since quitting: 32.6    Passive exposure: Current   Smokeless tobacco: Never   Tobacco comments:    Former smoker 09/27/21  Vaping Use   Vaping status: Never Used  Substance and Sexual Activity   Alcohol use: No     Alcohol/week: 0.0 standard drinks of alcohol    Comment: "used to drink; stopped ~ 2008"   Drug use: No   Sexual activity: Not Currently  Other Topics Concern   Not on file  Social History Narrative   Lives in [place], [place] with wife.   Social Determinants of Health   Financial Resource Strain: Not on file  Food Insecurity: No Food Insecurity (09/11/2022)   Hunger Vital Sign    Worried About Running Out of Food in the Last Year: Never true    Ran Out of Food in the Last Year: Never true  Transportation Needs: No Transportation Needs (09/11/2022)   PRAPARE - Administrator, Civil Service (Medical): No    Lack of Transportation (Non-Medical): No  Physical Activity: Not on file  Stress: Not on file  Social Connections: Not on file   Review of systems General: negative for malaise, night sweats, fever, chills, weight loss Neck: Negative for lumps, goiter, pain and significant neck swelling Resp: Negative for cough, wheezing, +SOB CV: Negative for chest pain, leg swelling, palpitations, orthopnea GI: denies melena, hematochezia, nausea, vomiting, diarrhea, constipation, dysphagia,  odyonophagia, early satiety or unintentional weight loss.  MSK: Negative for joint pain or swelling, back pain, and muscle pain. Derm: Negative for itching or rash Psych: Denies depression, anxiety, memory loss, confusion. No homicidal or suicidal ideation.  Heme: Negative for prolonged bleeding, bruising easily, and swollen nodes. Endocrine: Negative for cold or heat intolerance, polyuria, polydipsia and goiter. Neuro: negative for tremor, gait imbalance, syncope and seizures. The remainder of the review of systems is noncontributory.  Physical Exam: BP (!) 144/77 (BP Location: Right Arm, Patient Position: Sitting, Cuff Size: Large)   Pulse (!) 52   Temp (!) 97.5 F (36.4 C) (Temporal)   Ht 5\' 10"  (1.778 m)   Wt 239 lb 6.4 oz (108.6 kg)   BMI 34.35 kg/m  General:   Alert and oriented. No  distress noted. Pleasant and cooperative.  Head:  Normocephalic and atraumatic. Eyes:  Conjuctiva clear without scleral icterus. Mouth:  Oral mucosa pink and moist. Good dentition. No lesions. Heart: Normal rate and rhythm, s1 and s2 heart sounds present.  Lungs: Clear lung sounds in all lobes. Respirations equal and unlabored. Abdomen:  +BS, soft, non-tender and non-distended. No rebound or guarding. No HSM or masses noted. Rectal: patient declined Derm: No palmar erythema or jaundice Msk:  Symmetrical without gross deformities. Normal posture. Extremities:  Without edema. Neurologic:  Alert and  oriented x4 Psych:  Alert and cooperative. Normal mood and affect.  Invalid input(s): "6 MONTHS"   ASSESSMENT: Lucas Dudley is a 82 y.o. male presenting today for follow up of IDA and duodenal lesion   Ongoing IDA with extensive workup as above, last Capsule study earlier this year suggestive of Possible distal SB AVMs, patient declined further investigation via colonoscopy. He has no rectal bleeding or melena.  He is maintained on p.o. iron daily and continues to follow with hematology, he had 2 iron infusions in June, iron studies prior to that were within normal limits.  Last hemoglobin was 13.5.  At this time recommend continuing daily p.o. iron, he should continue to follow with hematology and their recommendations for any further iron infusions.  He will let me know if he has any rectal bleeding or melena.  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 Can discuss potential CT enterography at follow up visit.   Constipation well controlled with miralax, will continue with current regimen  Patient with continued  reports of what sounds like hemorrhoids that prolapse and are able to be pushed back in though he again declined rectal exam today. He has no pain, itching, burning, occasional toilet tissue hematochezia. I advised him to make me aware if he develops new/worsening symptoms or desires further evaluation/treatment of suspected hemorrhoids   PLAN:  Continue to follow with hematology   2. Continue PO iron pills  3. Patient to make me aware of worsening hemorrhoids  4. Continue miralax daily and protonix 40mg  daily  5. Consider CT enterography for duodenal lesion early 2025  All questions were answered, patient verbalized understanding and is in agreement with plan as outlined above.   Follow Up: 6 months    L. Jeanmarie Hubert, MSN, APRN, AGNP-C Adult-Gerontology Nurse Practitioner [facility]  I have reviewed the note and agree with the  APP's assessment as described in this progress note  Katrinka Blazing, MD Gastroenterology and Hepatology [facility]

## 2023-05-27 NOTE — Patient Instructions (Signed)
Please continue with daily iron pill and following with Dr. Ellin Saba with hematology for any further iron infusions as needed If hemorrhoids worsen, please let me know Continue protonix 40mg  once daily Continue with miralax for constipation  Follow up 6 months  It was a pleasure to see you today. I want to create trusting relationships with patients and provide genuine, compassionate, and quality care. I truly value your feedback! please be on the lookout for a survey regarding your visit with me today. I appreciate your input about our visit and your time in completing this!     L. Jeanmarie Hubert, MSN, APRN, AGNP-C Adult-Gerontology Nurse Practitioner [facility]astroenterology at [facility]

## 2023-05-28 ENCOUNTER — Encounter: Payer: Self-pay | Admitting: Cardiology

## 2023-05-28 ENCOUNTER — Ambulatory Visit: Payer: Medicare Other | Attending: Cardiology | Admitting: Cardiology

## 2023-05-28 VITALS — BP 120/66 | HR 59 | Ht 70.0 in | Wt 242.6 lb

## 2023-05-28 DIAGNOSIS — I4819 Other persistent atrial fibrillation: Secondary | ICD-10-CM | POA: Diagnosis not present

## 2023-05-28 DIAGNOSIS — I25119 Atherosclerotic heart disease of native coronary artery with unspecified angina pectoris: Secondary | ICD-10-CM | POA: Diagnosis not present

## 2023-05-28 DIAGNOSIS — I10 Essential (primary) hypertension: Secondary | ICD-10-CM

## 2023-05-28 NOTE — Patient Instructions (Addendum)

## 2023-05-28 NOTE — Progress Notes (Signed)
Cardiology Office Note  Date: 05/28/2023   ID: Lucas Dudley, DOB 27-Mar-1941, MRN [id number]  History of Present Illness: Lucas Dudley is an 82 y.o. male last seen in February.  He is here for a routine visit.  Reports no increasing angina or sense of palpitations since last encounter.  Still has to pace himself with activity, but indicates that he is able to walk out to his mailbox at the street and back to his house without stopping which is an improvement compared to last year.  I reviewed his medications.  Cardiac regimen includes amiodarone, Coreg, Lopid, lisinopril and Demadex with potassium supplement.  He continues to follow regularly with gastroenterology and hematology, I reviewed the recent office notes.  Lab work is reviewed below.  He reports no major bleeding episodes and his most recent hemoglobin was 13.5.  Physical Exam: VS:  BP 120/66   Pulse (!) 59   Ht 5\' 10"  (1.778 m)   Wt 242 lb 9.6 oz (110 kg)   SpO2 95%   BMI 34.81 kg/m , BMI Body mass index is 34.81 kg/m.  Wt Readings from Last 3 Encounters:  05/28/23 242 lb 9.6 oz (110 kg)  05/27/23 239 lb 6.4 oz (108.6 kg)  04/04/23 241 lb 1.6 oz (109.4 kg)    General: Patient appears comfortable at rest. HEENT: Conjunctiva and lids normal. Neck: Supple, no elevated JVP or carotid bruits. Lungs: Clear to auscultation, nonlabored breathing at rest. Cardiac: Regular rate and rhythm, no S3, 1/6 systolic murmur. Extremities: No pitting edema.  ECG:  An ECG dated 07/24/2022 was personally reviewed today and demonstrated:  Sinus rhythm with prolonged PR interval and left anterior fascicular block, decreased R wave progression.  Labwork: 06/01/2022: Magnesium 1.6 08/01/2022: ALT 11; AST 15; BUN 22; Creatinine, Ser 1.32; Potassium 5.2; Sodium 142 04/04/2023: Hemoglobin 13.5; Platelets 224   Other Studies Reviewed Today:  Echocardiogram 06/06/2022:  1. Left ventricular ejection fraction, by estimation, is 60 to 65%. The   left ventricle has normal function. The left ventricle has no regional  wall motion abnormalities. There is mild left ventricular hypertrophy.  Left ventricular diastolic parameters  were normal. The average left ventricular global longitudinal strain is  -17.7 %. The global longitudinal strain is normal.   2. Right ventricular systolic function is low normal. The right  ventricular size is normal. There is moderately elevated pulmonary artery  systolic pressure. The estimated right ventricular systolic pressure is  49.5 mmHg.   3. Left atrial size was mildly dilated.   4. The mitral valve is grossly normal. Trivial mitral valve  regurgitation.   5. The aortic valve is tricuspid. Aortic valve regurgitation is not  visualized. Aortic valve sclerosis/calcification is present, without any  evidence of aortic stenosis.   6. The inferior vena cava is normal in size with greater than 50%  respiratory variability, suggesting right atrial pressure of 3 mmHg.   Assessment and Plan:  1.  CAD with occluded ostial first diagonal and otherwise moderate LAD and PDA disease managed medically.  He reports no progressive angina on medical therapy.  Continue Coreg, lisinopril, and Lopid.  2.  Persistent atrial fibrillation with CHA2DS2-VASc score of 5 status post Watchman implantation by Dr. Excell Seltzer.  Completed course of Plavix and no longer on anticoagulation.  3.  Essential hypertension.  Pressures well-controlled today.  No changes made to current regimen.  4.  Mixed hyperlipidemia.  Continue Lopid.  5.  OSA on CPAP.  Disposition:  Follow up  6 months, sooner if needed.  Signed, Jonelle Sidle, M.D., F.A.C.C. [facility]

## 2023-06-04 NOTE — Progress Notes (Signed)
[facility] - Clinic Note  06/07/2023    CHIEF COMPLAINT Patient presents for Retina Follow Up  HISTORY OF PRESENT ILLNESS: Lucas Dudley is a 82 y.o. male who presents to the clinic today for:  HPI     Retina Follow Up   Patient presents with  Diabetic Retinopathy.  In both eyes.  This started 5 weeks ago.  Duration of 5 weeks.  Since onset it is stable.  I, the attending physician,  performed the HPI with the patient and updated documentation appropriately.        Comments   5 week retina follow up NPDR OU pt denies any vision changes noticed he does have floaters but denies any flashes pt last reading 103 A1C not done in over a year       Last edited by Rennis Chris, MD on 06/07/2023  3:32 PM.    Pt states   Referring physician: Gareth Morgan, MD [address] [place] [address]  HISTORICAL INFORMATION:   Selected notes from the MEDICAL RECORD NUMBER Referred by Dr. Alben Spittle for DEE   CURRENT MEDICATIONS: Current Outpatient Medications (Ophthalmic Drugs)  Medication Sig   Artificial Tear Ointment (DRY EYES OP) Place 1 drop into both eyes daily as needed (Dry eye).   COMBIGAN 0.2-0.5 % ophthalmic solution Place 1 drop into both eyes 2 (two) times daily.   No current facility-administered medications for this visit. (Ophthalmic Drugs)   Current Outpatient Medications (Other)  Medication Sig   acetaminophen (TYLENOL) 650 MG CR tablet Take 1,300 mg by mouth every 8 (eight) hours as needed for pain.   amiodarone (PACERONE) 200 MG tablet TAKE 1 TABLET BY MOUTH EVERY DAY   Ascorbic Acid (VITAMIN C) 100 MG tablet Take 100 mg by mouth daily.   aspirin 81 MG chewable tablet Chew 81 mg by mouth daily.   BERBERINE CHLORIDE PO Take by mouth daily.   camphor-menthol (ANTI-ITCH) lotion Apply 1 Application topically daily as needed for itching.   carvedilol (COREG) 3.125 MG tablet TAKE 1 TABLET BY MOUTH 2 TIMES DAILY.   CRANBERRY PO Take 1 tablet  by mouth daily.   cyanocobalamin (VITAMIN B12) 1000 MCG tablet Take 1 tablet (1,000 mcg total) by mouth daily.   ELDERBERRY PO Take 2 tablets by mouth daily. Chewable   ferrous sulfate 325 (65 FE) MG tablet Take 1 tablet (325 mg total) by mouth daily with breakfast.   gemfibrozil (LOPID) 600 MG tablet Take 600 mg by mouth at bedtime.    glimepiride (AMARYL) 2 MG tablet Take 2 mg by mouth daily in the afternoon.   Insulin Glargine (BASAGLAR KWIKPEN) 100 UNIT/ML Inject 38 Units into the skin at bedtime.   KLOR-CON M20 20 MEQ tablet TAKE 1 TABLET BY MOUTH EVERY DAY   lisinopril (ZESTRIL) 20 MG tablet TAKE 1 TABLET BY MOUTH EVERY DAY (Patient taking differently: Take 20 mg by mouth in the morning and at bedtime.)   magnesium gluconate (MAGONATE) 500 MG tablet Take 500 mg by mouth daily.   metFORMIN (GLUCOPHAGE-XR) 500 MG 24 hr tablet Take 1,000 mg by mouth in the morning and at bedtime.   mineral oil liquid Take 15 mLs by mouth daily as needed for moderate constipation.   Multiple Vitamins-Minerals (MULTIVITAMIN WITH MINERALS) tablet Take 1 tablet by mouth daily. Centrum men 50+   Omega-3 1000 MG CAPS Take 1,000 mg by mouth daily.   OVER THE COUNTER MEDICATION Take 1 tablet by mouth  daily. Beets Chew   pantoprazole (PROTONIX) 40 MG tablet Take 1 tablet (40 mg total) by mouth 2 (two) times daily before a meal. Twice daily for 1 month then may decrease back to once daily. (Patient taking differently: Take 40 mg by mouth daily. Twice daily for 1 month then may decrease back to once daily.)   torsemide (DEMADEX) 20 MG tablet TAKE 1 TABLET BY MOUTH TWICE A DAY   triamcinolone cream (KENALOG) 0.1 % Apply 1 Application topically 2 (two) times daily as needed (itch).   valACYclovir (VALTREX) 1000 MG tablet Take 1,000 mg by mouth at bedtime.   Current Facility-Administered Medications (Other)  Medication Route   0.9 %  sodium chloride infusion Intravenous   0.9 %  sodium chloride infusion Intravenous    REVIEW OF SYSTEMS: ROS   Positive for: Gastrointestinal, Endocrine, Cardiovascular, Eyes, Respiratory Negative for: Constitutional, Neurological, Skin, Genitourinary, Musculoskeletal, HENT, Psychiatric, Allergic/Imm, Heme/Lymph Last edited by Etheleen Mayhew, COT on 06/07/2023  1:51 PM.     ALLERGIES Allergies  Allergen Reactions   Azithromycin Nausea Only   Tape Rash and Other (See Comments)    Causes skin redness, Use paper tape only.   PAST MEDICAL HISTORY Past Medical History:  Diagnosis Date   Arthritis    Atrial fibrillation (HCC)    CAD (coronary artery disease)    a. Cath 03/17/15 showing 100% ostial D1, 50% prox LAD to mid LAD, 40% RPDA stenosis. Med rx. // Myoview 01/2020: EF 31 diffuse perfusion defect without reversibility (suspect artifact); reviewed with Dr. Tama High study felt to be low risk   Cataract    Mixed form OD   Chronic diastolic CHF (congestive heart failure) (HCC)    Diabetic retinopathy (HCC)    NPDR OU   Essential hypertension    Glaucoma    POAG OU   History of gout    Hyperlipidemia    Hypertensive retinopathy    OU   Kidney stones    Melanoma of neck (HCC)    NICM (nonischemic cardiomyopathy) (HCC)    OSA on CPAP 2012   Prostate cancer (HCC)    Type II diabetes mellitus (HCC)    Past Surgical History:  Procedure Laterality Date   ABDOMINAL HERNIA REPAIR     w/mesh   ATRIAL FIBRILLATION ABLATION N/A 06/06/2021   Procedure: ATRIAL FIBRILLATION ABLATION;  Surgeon: Hillis Range, MD;  Location: [facility] INVASIVE CV LAB;  Service: Cardiovascular;  Laterality: N/A;   BIOPSY  05/24/2022   Procedure: BIOPSY;  Surgeon: Corbin Ade, MD;  Location: [facility];  Service: Endoscopy;;   BIOPSY  05/30/2022   Procedure: BIOPSY;  Surgeon: Corbin Ade, MD;  Location: [facility];  Service: Endoscopy;;   BIOPSY  01/10/2023   Procedure: BIOPSY;  Surgeon: Lemar Lofty., MD;  Location: [facility];  Service: Gastroenterology;;    CARDIAC CATHETERIZATION N/A 03/17/2015   Procedure: Left Heart Cath and Coronary Angiography;  Surgeon: Runell Gess, MD;  Location: [facility] INVASIVE CV LAB;  Service: Cardiovascular;  Laterality: N/A;   CARDIOVERSION N/A 08/09/2021   Procedure: CARDIOVERSION;  Surgeon: Chilton Si, MD;  Location: [facility] ENDOSCOPY;  Service: Cardiovascular;  Laterality: N/A;   CARDIOVERSION N/A 11/10/2021   Procedure: CARDIOVERSION;  Surgeon: Thomasene Ripple, DO;  Location: [facility] ENDOSCOPY;  Service: Cardiovascular;  Laterality: N/A;   carotid doppler  09/17/2008   rigt and left ICAs 0-49%;mildly  abnormal   CATARACT EXTRACTION Left 2020   Dr. Alben Spittle   COLONOSCOPY N/A  05/16/2017   Procedure: COLONOSCOPY;  Surgeon: Malissa Hippo, MD;  Location: [facility];  Service: Endoscopy;  Laterality: N/A;  930   COLONOSCOPY WITH PROPOFOL N/A 05/24/2022   Procedure: COLONOSCOPY WITH PROPOFOL;  Surgeon: Corbin Ade, MD;  [facility]: [facility];  Service: Endoscopy;  Laterality: N/A;   DOPPLER ECHOCARDIOGRAPHY  05/25/2009   EF 50-55%,LA mildly dilated, LV function normal   ELECTROPHYSIOLOGIC STUDY N/A 04/05/2015   Procedure: Cardioversion;  Surgeon: Thurmon Fair, MD;  Location: [facility] CV LAB;  Service: Cardiovascular;  Laterality: N/A;   ELECTROPHYSIOLOGIC STUDY N/A 09/06/2015   Procedure: Atrial Fibrillation Ablation;  Surgeon: Hillis Range, MD;  Location: [facility] INVASIVE CV LAB;  Service: Cardiovascular;  Laterality: N/A;   ELECTROPHYSIOLOGIC STUDY N/A 07/12/2016   redo afib ablation by Dr Johney Frame   ENTEROSCOPY N/A 05/30/2022   Procedure: ENTEROSCOPY;  Surgeon: Corbin Ade, MD;  [facility];  Service: Endoscopy;  Laterality: N/A;   ESOPHAGOGASTRODUODENOSCOPY (EGD) WITH PROPOFOL N/A 05/24/2022   Procedure: ESOPHAGOGASTRODUODENOSCOPY (EGD) WITH PROPOFOL;  Surgeon: Corbin Ade, MD;  [facility];  Service: Endoscopy;  Laterality: N/A;   ESOPHAGOGASTRODUODENOSCOPY (EGD) WITH PROPOFOL N/A  01/10/2023   Procedure: ESOPHAGOGASTRODUODENOSCOPY (EGD) WITH PROPOFOL;  Surgeon: Meridee Score Netty Starring., MD;  Location: [facility] ENDOSCOPY;  Service: Gastroenterology;  Laterality: N/A;   EUS N/A 01/10/2023   Procedure: UPPER ENDOSCOPIC ULTRASOUND (EUS) RADIAL;  Surgeon: Lemar Lofty., MD;  Location: [facility] ENDOSCOPY;  Service: Gastroenterology;  Laterality: N/A;   EXCISIONAL HEMORRHOIDECTOMY     "inside and out"   EYE SURGERY Left 2020   Cat Sx - Dr. Alben Spittle   FINE NEEDLE ASPIRATION Right    knee; "drew ~ 1 quart off"   GIVENS CAPSULE STUDY N/A 05/25/2022   Procedure: GIVENS CAPSULE STUDY;  Surgeon: Lanelle Bal, DO;  Location: [facility];  Service: Endoscopy;  Laterality: N/A;   GIVENS CAPSULE STUDY N/A 01/23/2023   Procedure: GIVENS CAPSULE STUDY;  Surgeon: Dolores Frame, MD;  [facility]: [facility];  Service: Gastroenterology;  Laterality: N/A;  8:30am   HERNIA REPAIR     HOT HEMOSTASIS N/A 01/10/2023   Procedure: HOT HEMOSTASIS (ARGON PLASMA COAGULATION/BICAP);  Surgeon: Lemar Lofty., MD;  Location: [facility];  Service: Gastroenterology;  Laterality: N/A;   LAPAROSCOPIC CHOLECYSTECTOMY     LEFT ATRIAL APPENDAGE OCCLUSION N/A 06/28/2022   Procedure: LEFT ATRIAL APPENDAGE OCCLUSION;  Surgeon: Tonny Bollman, MD;  Location: [facility] INVASIVE CV LAB;  Service: Cardiovascular;  Laterality: N/A;   MELANOMA EXCISION Right    "neck"   NM MYOCAR PERF WALL MOTION  02/21/2012   EF 61% ,EXERCISE 7 METS. exercise stopped due to wheezing and shortness of breathe   POLYPECTOMY  05/16/2017   Procedure: POLYPECTOMY;  Surgeon: Malissa Hippo, MD;  [facility]: [facility];  Service: Endoscopy;;  colon   POLYPECTOMY  05/24/2022   Procedure: POLYPECTOMY;  Surgeon: Corbin Ade, MD;  [facility];  Service: Endoscopy;;   POLYPECTOMY  01/10/2023   Procedure: POLYPECTOMY;  Surgeon: Meridee Score Netty Starring., MD;  Location: [facility]OS[facility];  Service: Gastroenterology;;    PROSTATECTOMY     SHOULDER OPEN ROTATOR CUFF REPAIR Right X 2   SUBMUCOSAL TATTOO INJECTION  05/30/2022   Procedure: SUBMUCOSAL TATTOO INJECTION;  Surgeon: Corbin Ade, MD;  Location: [facility];  Service: Endoscopy;;   SUBMUCOSAL TATTOO INJECTION  01/10/2023   Procedure: SUBMUCOSAL TATTOO INJECTION;  Surgeon: Lemar Lofty., MD;  Location: [facility]OS[facility];  Service: Gastroenterology;;   TEE WITHOUT CARDIOVERSION N/A 09/05/2015   Procedure: TRANSESOPHAGEAL ECHOCARDIOGRAM (TEE);  Surgeon: Quintella Reichert, MD;  Location: [facility] ENDOSCOPY;  Service: Cardiovascular;  Laterality: N/A;   TEE WITHOUT CARDIOVERSION N/A 06/28/2022   Procedure: TRANSESOPHAGEAL ECHOCARDIOGRAM (TEE);  Surgeon: Tonny Bollman, MD;  Location: [facility] INVASIVE CV LAB;  Service: Cardiovascular;  Laterality: N/A;   FAMILY HISTORY Family History  Problem Relation Age of Onset   Heart disease Mother    Lung cancer Mother    Heart attack Mother 56   Diabetes Father    Heart disease Father    Stroke Brother    Healthy Daughter    Colon cancer Maternal Aunt        67s   SOCIAL HISTORY Social History   Tobacco Use   Smoking status: Former    Current packs/day: 0.00    Average packs/day: 2.0 packs/day for 27.0 years (54.0 ttl pk-yrs)    Types: Cigarettes    Start date: 66    Quit date: 1992    Years since quitting: 32.6    Passive exposure: Current   Smokeless tobacco: Never   Tobacco comments:    Former smoker 09/27/21  Vaping Use   Vaping status: Never Used  Substance Use Topics   Alcohol use: No    Alcohol/week: 0.0 standard drinks of alcohol    Comment: "used to drink; stopped ~ 2008"   Drug use: No       OPHTHALMIC EXAM: Base Eye Exam     Visual Acuity (Snellen - Linear)       Right Left   Dist cc 20/30 -2 20/25 -2   Dist ph cc 20/25 -2 20/20 -2    Correction: Glasses         Tonometry (Tonopen, 1:59 PM)       Right Left   Pressure 17 19         Pupils       Pupils Dark Light  Shape React APD   Right PERRL 2 1 Round Brisk None   Left PERRL 2 1 Round Brisk None         Visual Fields       Left Right    Full Full         Extraocular Movement       Right Left    Full, Ortho Full, Ortho         Neuro/Psych     Oriented x3: Yes   Mood/Affect: Normal         Dilation     Both eyes: 2.5% Phenylephrine @ 1:59 PM           Slit Lamp and Fundus Exam     Slit Lamp Exam       Right Left   Lids/Lashes Dermato, mild MGD Dermato, mild MGD   Conjunctiva/Sclera Temporal pinguecula, mild inferior sub conj heme Temporal pinguecula   Cornea EBMD, mild haze, trace PEE, mild Debris in tear film, well healed cataract wound trace haze, trace PEE, well healed temporal cataract wounds, arcus   Anterior Chamber Deep and clear; narrow temporal angle Deep and clear   Iris Round and dilated, mild anterior bowing, No NVI Round and moderately dilated to 5.78mm   Lens PCIOL in good position, trace PCO PC IOL in good position with open PC   Anterior Vitreous Synerisis Synerisis         Fundus Exam  Right Left   Disc Mild pallor, sharp rim, +cupping, thin inferior rim Mild pallor, sharp rim, +cupping, +PPA   C/D Ratio 0.7 0.6   Macula Flat, blunted foveal reflex, cystic changes - slightly improved, RPE mottling and clumping, no heme, Drusen, punctate Exudates -- slightly improved Flat, good foveal reflex, no heme, cystic changes -- stably improved   Vessels mild attenuation, mild tortuosity Attenuated, mild av crossing changes, mild tortuousity   Periphery Attached, rare MA, forcal DBH nasal to disc--improved Attached. No heme.           Refraction     Wearing Rx       Sphere Cylinder Axis Add   Right -0.25 +2.00 158 +2.50   Left -1.00 +1.25 008 +2.50           IMAGING AND PROCEDURES  Imaging and Procedures for 06/07/2023  OCT, Retina - OU - Both Eyes       Right Eye Quality was good. Central Foveal Thickness: 321. Progression has  improved. Findings include no SRF, abnormal foveal contour, intraretinal fluid, vitreomacular adhesion (Interval improvement in IRF/IRHM, partial PVD).   Left Eye Quality was good. Central Foveal Thickness: 295. Progression has been stable. Findings include normal foveal contour, no IRF, no SRF, intraretinal hyper-reflective material (Mild interval increase in cystic changes nasal fovea; punctate IRHM; partial PVD).   Notes *Images captured and stored on drive  Diagnosis / Impression:  +DME OU OD: Interval improvement in IRF/IRHM, partial PVD OS: Mild interval increase in cystic changes nasal fovea; punctate IRHM; partial PVD  Clinical management:  See below  Abbreviations: NFP - Normal foveal profile. CME - cystoid macular edema. PED - pigment epithelial detachment. IRF - intraretinal fluid. SRF - subretinal fluid. EZ - ellipsoid zone. ERM - epiretinal membrane. ORA - outer retinal atrophy. ORT - outer retinal tubulation. SRHM - subretinal hyper-reflective material. IRHM - intraretinal hyper-reflective material     Intravitreal Injection, Pharmacologic Agent - OD - Right Eye       Time Out 06/07/2023. 2:42 PM. Confirmed correct patient, procedure, site, and patient consented.   Anesthesia Topical anesthesia was used. Anesthetic medications included Lidocaine 2%, Proparacaine 0.5%.   Procedure Preparation included 5% betadine to ocular surface, eyelid speculum. A (32g) needle was used.   Injection: 6 mg faricimab-svoa 6 MG/0.05ML   Route: Intravitreal, Site: Right Eye   NDC: O8010301, Lot: [id number], Expiration date: 06/14/2025, Waste: 0 mL   Post-op Post injection exam found visual acuity of at least counting fingers. The patient tolerated the procedure well. There were no complications. The patient received written and verbal post procedure care education.            ASSESSMENT/PLAN:   ICD-10-CM   1. 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 Current use of insulin (HCC)  Z79.4     3. Long term (current) use of oral hypoglycemic drugs  Z79.84     4. Essential hypertension  I10     5. Hypertensive retinopathy of both eyes  H35.033     6. Pseudophakia of both eyes  Z96.1     7. 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 History of herpes zoster of eye  Z86.19     9. Primary open angle glaucoma of both eyes, unspecified glaucoma stage  H40.1130      1-3. 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 Single focal MA superior to fovea; OS: Perifoveal Mas w/ late leakage - s/p IVA OS # 1(07.26.21), #2 (08.24.21), #3 (09.21.21), #4 (10.22.21) -- IVA resistance ========================================================== - s/p IVE OD #1 (09.29.23), #2 (10.30.23), #3 (SAMPLE) (11.27.23) #4 (12.29.23) #5 (01.26.24) #6 (02.23.24) -- IVE resistance - s/p IVE OS #1 (11.22.21), #2 (12.21.21), #3 (01.20.22), #4 (02.18.22), #5 (03.18.22), #6 (04.15.22), #7 (05.10.22) -- sample, #8 (06.08.22), #9 (07.12.22), #10 (08.12.22), #11 (09.16.22), #12 (10.21.22), #13 (11.18.22), #14 (12.21.22), #15 (01.20.23), #16 (02.17.23), #17 (03.24.23), #18 (04.21.23), #19 (05.26.23), #20 (06.23.23) -- IVE resistance =========================================================== - s/p IVV OD #1 (03.22.24), #2 (04.19.24), #3 (05.17.24), #4 (06.18.24), #5 (07.19.24) - s/p IVV OS #1 (07.21.23), #2 (08.25.23), #3 (09.29.23), #4 (10.30.23), #5 (11.27.23) #6 (12.29.23), #7 (01.26.23), #8 (02.23.24), #9 (03.22.24), #10(04.19.24), #11 (05.17.24) - BCVA OD 20/25; OS 20/20 - stable - OCT shows OD: Interval  improvement in IRF/IRHM, partial PVD; OS: Mild interval increase in cystic changes nasal fovea; punctate IRHM; partial PVD at 5 weeks - recommend IVV OD #6  today, 08.23.24 with f/u in 4-5 weeks - recommend holding injection OS again today -- pt in agreement  - pt wishes to proceed with injection OD  - RBA of procedure discussed, questions answered  - Vabysmo informed consent obtained and signed, 03.22.24 (OD)  - Vabysmo informed consent obtained and signed, 07.21.23 (OS) - Eylea approved until 10/04/23 - [employer] approved Vabysmo - see procedure note - f/u 4-5 weeks -- DFE/OCT, possible injection(s)  4,5. Hypertensive retinopathy OU - discussed importance of tight BP control - continue to monitor  6. Pseudophakia OU  - s/p CE/IOL OS (2020, Dr. Alben Spittle)             - s/p CE/IOL OD (2022, Dr. Alben Spittle)  - s/p YAG cap OS (04.04.23) - BCVA 20/30 OD and 20/25 OS from 20/150  - IOLs in good position, doing well  - continue to monitor  7. 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 Valere Dross in August 2022  - s/p SuperK OS on 12.12.2022  8. History of herpes zoster/iritis OS  - on Valtrex 1 g daily---maintenance  9. POAG OU  - was under the expert management of Dr. Alben Spittle, now follows with Dr. Zenaida Niece  - IOP 17, 19  - continue Cosopt bid OU  Ophthalmic Meds Ordered this visit:  Meds ordered this encounter  Medications   faricimab-svoa (VABYSMO) 6mg /0.82mL intravitreal injection     This document serves as a record of services personally performed by Karie Chimera, MD, PhD. It was created on their behalf by Glee Arvin. Manson Passey, OA an ophthalmic technician. The creation of this record is the provider's dictation and/or activities during the visit.    Electronically signed by: Glee Arvin. Manson Passey, OA 06/07/23 3:33 PM  This document serves as a record of services personally performed by Karie Chimera, MD, PhD. It was created on their behalf by Glee Arvin. Manson Passey, OA an ophthalmic technician. The  creation of this record is the provider's dictation and/or activities during the visit.    Electronically signed by: Glee Arvin. Manson Passey, OA 06/07/23 3:33 PM  Karie Chimera, M.D., Ph.D. Diseases & Surgery of the Retina  and Vitreous [facility] 06/07/2023   I have reviewed the above documentation for accuracy and completeness, and I agree with the above. Karie Chimera, M.D., Ph.D. 06/07/23 3:34 PM  Abbreviations: M myopia (nearsighted); A astigmatism; H hyperopia (farsighted); P presbyopia; Mrx spectacle prescription;  CTL contact lenses; OD right eye; OS left eye; OU both eyes  XT exotropia; ET esotropia; PEK punctate epithelial keratitis; PEE punctate epithelial erosions; DES dry eye syndrome; MGD meibomian gland dysfunction; ATs artificial tears; PFAT's preservative free artificial tears; NSC nuclear sclerotic cataract; PSC posterior subcapsular cataract; ERM epi-retinal membrane; PVD posterior vitreous detachment; RD retinal detachment; DM diabetes mellitus; DR diabetic retinopathy; NPDR non-proliferative diabetic retinopathy; PDR proliferative diabetic retinopathy; CSME clinically significant macular edema; DME diabetic macular edema; dbh dot blot hemorrhages; CWS cotton wool spot; POAG primary open angle glaucoma; C/D cup-to-disc ratio; HVF humphrey visual field; GVF goldmann visual field; OCT optical coherence tomography; IOP intraocular pressure; BRVO Branch retinal vein occlusion; CRVO central retinal vein occlusion; CRAO central retinal artery occlusion; BRAO branch retinal artery occlusion; RT retinal tear; SB scleral buckle; PPV pars plana vitrectomy; VH Vitreous hemorrhage; PRP panretinal laser photocoagulation; IVK intravitreal kenalog; VMT vitreomacular traction; MH Macular hole;  NVD neovascularization of the disc; NVE neovascularization elsewhere; AREDS age related eye disease study; ARMD age related macular degeneration; POAG primary open angle glaucoma; EBMD  epithelial/anterior basement membrane dystrophy; ACIOL anterior chamber intraocular lens; IOL intraocular lens; PCIOL posterior chamber intraocular lens; Phaco/IOL phacoemulsification with intraocular lens placement; PRK photorefractive keratectomy; LASIK laser assisted in situ keratomileusis; HTN hypertension; DM diabetes mellitus; COPD chronic obstructive pulmonary disease

## 2023-06-07 ENCOUNTER — Ambulatory Visit (INDEPENDENT_AMBULATORY_CARE_PROVIDER_SITE_OTHER): Payer: Medicare Other | Admitting: Ophthalmology

## 2023-06-07 ENCOUNTER — Encounter (INDEPENDENT_AMBULATORY_CARE_PROVIDER_SITE_OTHER): Payer: Self-pay | Admitting: Ophthalmology

## 2023-06-07 DIAGNOSIS — Z794 Long term (current) use of insulin: Secondary | ICD-10-CM | POA: Diagnosis not present

## 2023-06-07 DIAGNOSIS — E113213 Type 2 diabetes mellitus with mild nonproliferative diabetic retinopathy with macular edema, bilateral: Secondary | ICD-10-CM

## 2023-06-07 DIAGNOSIS — H35033 Hypertensive retinopathy, bilateral: Secondary | ICD-10-CM

## 2023-06-07 DIAGNOSIS — Z7984 Long term (current) use of oral hypoglycemic drugs: Secondary | ICD-10-CM

## 2023-06-07 DIAGNOSIS — H401130 Primary open-angle glaucoma, bilateral, stage unspecified: Secondary | ICD-10-CM

## 2023-06-07 DIAGNOSIS — Z961 Presence of intraocular lens: Secondary | ICD-10-CM

## 2023-06-07 DIAGNOSIS — I10 Essential (primary) hypertension: Secondary | ICD-10-CM | POA: Diagnosis not present

## 2023-06-07 DIAGNOSIS — H18523 Epithelial (juvenile) corneal dystrophy, bilateral: Secondary | ICD-10-CM

## 2023-06-07 DIAGNOSIS — Z8619 Personal history of other infectious and parasitic diseases: Secondary | ICD-10-CM

## 2023-06-07 MED ORDER — FARICIMAB-SVOA 6 MG/0.05ML IZ SOLN
6.0000 mg | INTRAVITREAL | Status: AC | PRN
Start: 2023-06-07 — End: 2023-06-07
  Administered 2023-06-07: 6 mg via INTRAVITREAL

## 2023-07-04 ENCOUNTER — Other Ambulatory Visit (HOSPITAL_COMMUNITY): Payer: Self-pay | Admitting: Cardiology

## 2023-07-04 LAB — HM DIABETES EYE EXAM

## 2023-07-07 ENCOUNTER — Other Ambulatory Visit (INDEPENDENT_AMBULATORY_CARE_PROVIDER_SITE_OTHER): Payer: Self-pay | Admitting: Gastroenterology

## 2023-07-08 NOTE — Progress Notes (Signed)
[facility] - Clinic Note  07/12/2023    CHIEF COMPLAINT Patient presents for Retina Follow Up  HISTORY OF PRESENT ILLNESS: Lucas Dudley is a 82 y.o. male who presents to the clinic today for:  HPI     Retina Follow Up   Patient presents with  Diabetic Retinopathy.  In both eyes.  This started 5 weeks ago.  Duration of 5 weeks.  Since onset it is stable.  I, the attending physician,  performed the HPI with the patient and updated documentation appropriately.        Comments   5 week retina follow up NPDR and IVV OD pt is reporting no vision changes noticed he saw Dr Zenaida Niece a week ago and was told his vision had not really changed he has some floaters but denies flashes he is taking [place] bid ou       Last edited by Rennis Chris, MD on 07/12/2023  3:28 PM.      Referring physician: Anabel Halon, MD [address] [place],  [place] [address]  HISTORICAL INFORMATION:   Selected notes from the MEDICAL RECORD NUMBER Referred by Dr. Alben Spittle for DEE   CURRENT MEDICATIONS: Current Outpatient Medications (Ophthalmic Drugs)  Medication Sig   Artificial Tear Ointment (DRY EYES OP) Place 1 drop into both eyes daily as needed (Dry eye).   COMBIGAN 0.2-0.5 % ophthalmic solution Place 1 drop into both eyes 2 (two) times daily.   No current facility-administered medications for this visit. (Ophthalmic Drugs)   Current Outpatient Medications (Other)  Medication Sig   acetaminophen (TYLENOL) 650 MG CR tablet Take 1,300 mg by mouth every 8 (eight) hours as needed for pain.   amiodarone (PACERONE) 200 MG tablet TAKE 1 TABLET BY MOUTH EVERY DAY   Ascorbic Acid (VITAMIN C) 100 MG tablet Take 100 mg by mouth daily.   aspirin 81 MG chewable tablet Chew 81 mg by mouth daily.   BERBERINE CHLORIDE PO Take by mouth daily.   camphor-menthol (ANTI-ITCH) lotion Apply 1 Application topically daily as needed for itching.   carvedilol (COREG) 3.125 MG tablet TAKE 1 TABLET BY  MOUTH 2 TIMES DAILY.   CRANBERRY PO Take 1 tablet by mouth daily.   cyanocobalamin (VITAMIN B12) 1000 MCG tablet Take 1 tablet (1,000 mcg total) by mouth daily.   ELDERBERRY PO Take 2 tablets by mouth daily. Chewable   ferrous sulfate 325 (65 FE) MG tablet Take 1 tablet (325 mg total) by mouth daily with breakfast.   gemfibrozil (LOPID) 600 MG tablet Take 600 mg by mouth at bedtime.    glimepiride (AMARYL) 2 MG tablet Take 2 mg by mouth daily in the afternoon.   Insulin Glargine (BASAGLAR KWIKPEN) 100 UNIT/ML Inject 38 Units into the skin at bedtime.   KLOR-CON M20 20 MEQ tablet TAKE 1 TABLET BY MOUTH EVERY DAY   lisinopril (ZESTRIL) 20 MG tablet TAKE 1 TABLET BY MOUTH EVERY DAY (Patient taking differently: Take 20 mg by mouth in the morning and at bedtime.)   magnesium gluconate (MAGONATE) 500 MG tablet Take 500 mg by mouth daily.   metFORMIN (GLUCOPHAGE-XR) 500 MG 24 hr tablet Take 1,000 mg by mouth in the morning and at bedtime.   mineral oil liquid Take 15 mLs by mouth daily as needed for moderate constipation.   Multiple Vitamins-Minerals (MULTIVITAMIN WITH MINERALS) tablet Take 1 tablet by mouth daily. Centrum men 50+   Omega-3 1000 MG CAPS Take 1,000 mg by mouth  daily.   OVER THE COUNTER MEDICATION Take 1 tablet by mouth daily. Beets Chew   pantoprazole (PROTONIX) 40 MG tablet TAKE 1 TABLET BY MOUTH EVERY DAY   torsemide (DEMADEX) 20 MG tablet TAKE 1 TABLET BY MOUTH TWICE A DAY   triamcinolone cream (KENALOG) 0.1 % Apply 1 Application topically 2 (two) times daily as needed (itch).   valACYclovir (VALTREX) 1000 MG tablet Take 1,000 mg by mouth at bedtime.   Current Facility-Administered Medications (Other)  Medication Route   0.9 %  sodium chloride infusion Intravenous   0.9 %  sodium chloride infusion Intravenous   REVIEW OF SYSTEMS: ROS   Positive for: Gastrointestinal, Endocrine, Cardiovascular, Eyes, Respiratory Negative for: Constitutional, Neurological, Skin, Genitourinary,  Musculoskeletal, HENT, Psychiatric, Allergic/Imm, Heme/Lymph Last edited by Etheleen Mayhew, COT on 07/12/2023  1:30 PM.      ALLERGIES Allergies  Allergen Reactions   Azithromycin Nausea Only   Tape Rash and Other (See Comments)    Causes skin redness, Use paper tape only.   PAST MEDICAL HISTORY Past Medical History:  Diagnosis Date   Arthritis    Atrial fibrillation (HCC)    CAD (coronary artery disease)    a. Cath 03/17/15 showing 100% ostial D1, 50% prox LAD to mid LAD, 40% RPDA stenosis. Med rx. // Myoview 01/2020: EF 31 diffuse perfusion defect without reversibility (suspect artifact); reviewed with Dr. Tama High study felt to be low risk   Cataract    Mixed form OD   Chronic diastolic CHF (congestive heart failure) (HCC)    Diabetic retinopathy (HCC)    NPDR OU   Essential hypertension    Glaucoma    POAG OU   History of gout    Hyperlipidemia    Hypertensive retinopathy    OU   Kidney stones    Melanoma of neck (HCC)    NICM (nonischemic cardiomyopathy) (HCC)    OSA on CPAP 2012   Prostate cancer (HCC)    Type II diabetes mellitus (HCC)    Past Surgical History:  Procedure Laterality Date   ABDOMINAL HERNIA REPAIR     w/mesh   ATRIAL FIBRILLATION ABLATION N/A 06/06/2021   Procedure: ATRIAL FIBRILLATION ABLATION;  Surgeon: Hillis Range, MD;  Location: [facility] INVASIVE CV LAB;  Service: Cardiovascular;  Laterality: N/A;   BIOPSY  05/24/2022   Procedure: BIOPSY;  Surgeon: Corbin Ade, MD;  Location: [facility];  Service: Endoscopy;;   BIOPSY  05/30/2022   Procedure: BIOPSY;  Surgeon: Corbin Ade, MD;  [facility]: [facility];  Service: Endoscopy;;   BIOPSY  01/10/2023   Procedure: BIOPSY;  Surgeon: Lemar Lofty., MD;  Location: [facility];  Service: Gastroenterology;;   CARDIAC CATHETERIZATION N/A 03/17/2015   Procedure: Left Heart Cath and Coronary Angiography;  Surgeon: Runell Gess, MD;  Location: [facility] INVASIVE CV LAB;  Service:  Cardiovascular;  Laterality: N/A;   CARDIOVERSION N/A 08/09/2021   Procedure: CARDIOVERSION;  Surgeon: Chilton Si, MD;  Location: [facility] ENDOSCOPY;  Service: Cardiovascular;  Laterality: N/A;   CARDIOVERSION N/A 11/10/2021   Procedure: CARDIOVERSION;  Surgeon: Thomasene Ripple, DO;  Location: [facility] ENDOSCOPY;  Service: Cardiovascular;  Laterality: N/A;   carotid doppler  09/17/2008   rigt and left ICAs 0-49%;mildly  abnormal   CATARACT EXTRACTION Left 2020   Dr. Alben Spittle   COLONOSCOPY N/A 05/16/2017   Procedure: COLONOSCOPY;  Surgeon: Malissa Hippo, MD;  Location: [facility];  Service: Endoscopy;  Laterality: N/A;  930   COLONOSCOPY WITH  PROPOFOL N/A 05/24/2022   Procedure: COLONOSCOPY WITH PROPOFOL;  Surgeon: Corbin Ade, MD;  [facility]: [facility];  Service: Endoscopy;  Laterality: N/A;   DOPPLER ECHOCARDIOGRAPHY  05/25/2009   EF 50-55%,LA mildly dilated, LV function normal   ELECTROPHYSIOLOGIC STUDY N/A 04/05/2015   Procedure: Cardioversion;  Surgeon: Thurmon Fair, MD;  Location: [facility] LAB;  Service: Cardiovascular;  Laterality: N/A;   ELECTROPHYSIOLOGIC STUDY N/A 09/06/2015   Procedure: Atrial Fibrillation Ablation;  Surgeon: Hillis Range, MD;  Location: [facility] INVASIVE CV LAB;  Service: Cardiovascular;  Laterality: N/A;   ELECTROPHYSIOLOGIC STUDY N/A 07/12/2016   redo afib ablation by Dr Johney Frame   ENTEROSCOPY N/A 05/30/2022   Procedure: ENTEROSCOPY;  Surgeon: Corbin Ade, MD;  [facility]: [facility];  Service: Endoscopy;  Laterality: N/A;   ESOPHAGOGASTRODUODENOSCOPY (EGD) WITH PROPOFOL N/A 05/24/2022   Procedure: ESOPHAGOGASTRODUODENOSCOPY (EGD) WITH PROPOFOL;  Surgeon: Corbin Ade, MD;  [facility]: [facility];  Service: Endoscopy;  Laterality: N/A;   ESOPHAGOGASTRODUODENOSCOPY (EGD) WITH PROPOFOL N/A 01/10/2023   Procedure: ESOPHAGOGASTRODUODENOSCOPY (EGD) WITH PROPOFOL;  Surgeon: Meridee Score Netty Starring., MD;  Location: [facility] ENDOSCOPY;  Service: Gastroenterology;   Laterality: N/A;   EUS N/A 01/10/2023   Procedure: UPPER ENDOSCOPIC ULTRASOUND (EUS) RADIAL;  Surgeon: Lemar Lofty., MD;  Location: [facility] ENDOSCOPY;  Service: Gastroenterology;  Laterality: N/A;   EXCISIONAL HEMORRHOIDECTOMY     "inside and out"   EYE SURGERY Left 2020   Cat Sx - Dr. Alben Spittle   FINE NEEDLE ASPIRATION Right    knee; "drew ~ 1 quart off"   GIVENS CAPSULE STUDY N/A 05/25/2022   Procedure: GIVENS CAPSULE STUDY;  Surgeon: Lanelle Bal, DO;  Location: [facility];  Service: Endoscopy;  Laterality: N/A;   GIVENS CAPSULE STUDY N/A 01/23/2023   Procedure: GIVENS CAPSULE STUDY;  Surgeon: Dolores Frame, MD;  Location: [facility];  Service: Gastroenterology;  Laterality: N/A;  8:30am   HERNIA REPAIR     HOT HEMOSTASIS N/A 01/10/2023   Procedure: HOT HEMOSTASIS (ARGON PLASMA COAGULATION/BICAP);  Surgeon: Lemar Lofty., MD;  Location: [facility];  Service: Gastroenterology;  Laterality: N/A;   LAPAROSCOPIC CHOLECYSTECTOMY     LEFT ATRIAL APPENDAGE OCCLUSION N/A 06/28/2022   Procedure: LEFT ATRIAL APPENDAGE OCCLUSION;  Surgeon: Tonny Bollman, MD;  Location: [facility] INVASIVE CV LAB;  Service: Cardiovascular;  Laterality: N/A;   MELANOMA EXCISION Right    "neck"   NM MYOCAR PERF WALL MOTION  02/21/2012   EF 61% ,EXERCISE 7 METS. exercise stopped due to wheezing and shortness of breathe   POLYPECTOMY  05/16/2017   Procedure: POLYPECTOMY;  Surgeon: Malissa Hippo, MD;  [facility];  Service: Endoscopy;;  colon   POLYPECTOMY  05/24/2022   Procedure: POLYPECTOMY;  Surgeon: Corbin Ade, MD;  [facility];  Service: Endoscopy;;   POLYPECTOMY  01/10/2023   Procedure: POLYPECTOMY;  Surgeon: Meridee Score Netty Starring., MD;  Location: [facility];  Service: Gastroenterology;;   PROSTATECTOMY     SHOULDER OPEN ROTATOR CUFF REPAIR Right X 2   SUBMUCOSAL TATTOO INJECTION  05/30/2022   Procedure: SUBMUCOSAL TATTOO INJECTION;  Surgeon: Corbin Ade, MD;  Location: [facility];  Service: Endoscopy;;   SUBMUCOSAL TATTOO INJECTION  01/10/2023   Procedure: SUBMUCOSAL TATTOO INJECTION;  Surgeon: Lemar Lofty., MD;  Location: [facility];  Service: Gastroenterology;;   TEE WITHOUT CARDIOVERSION N/A 09/05/2015   Procedure: TRANSESOPHAGEAL ECHOCARDIOGRAM (TEE);  Surgeon: Quintella Reichert, MD;  Location: [facility]  ENDOSCOPY;  Service: Cardiovascular;  Laterality: N/A;   TEE WITHOUT CARDIOVERSION N/A 06/28/2022   Procedure: TRANSESOPHAGEAL ECHOCARDIOGRAM (TEE);  Surgeon: Tonny Bollman, MD;  Location: [facility] INVASIVE CV LAB;  Service: Cardiovascular;  Laterality: N/A;   FAMILY HISTORY Family History  Problem Relation Age of Onset   Heart disease Mother    Lung cancer Mother    Heart attack Mother 66   Diabetes Father    Heart disease Father    Stroke Brother    Healthy Daughter    Colon cancer Maternal Aunt        26s   SOCIAL HISTORY Social History   Tobacco Use   Smoking status: Former    Current packs/day: 0.00    Average packs/day: 2.0 packs/day for 27.0 years (54.0 ttl pk-yrs)    Types: Cigarettes    Start date: 64    Quit date: 1992    Years since quitting: 32.7    Passive exposure: Current   Smokeless tobacco: Never   Tobacco comments:    Former smoker 09/27/21  Vaping Use   Vaping status: Never Used  Substance Use Topics   Alcohol use: No    Alcohol/week: 0.0 standard drinks of alcohol    Comment: "used to drink; stopped ~ 2008"   Drug use: No       OPHTHALMIC EXAM: Base Eye Exam     Visual Acuity (Snellen - Linear)       Right Left   Dist cc 20/30 -2 20/25 -2   Dist ph cc 20/25 -3 NI    Correction: Glasses         Tonometry (Tonopen, 1:36 PM)       Right Left   Pressure 19 18         Pupils       Pupils Dark Light Shape React APD   Right PERRL 2 1 Round Brisk None   Left PERRL 2 1 Round Brisk None         Visual Fields       Left Right    Full Full         Extraocular  Movement       Right Left    Full, Ortho Full, Ortho         Neuro/Psych     Oriented x3: Yes   Mood/Affect: Normal         Dilation     Both eyes: 2.5% Phenylephrine @ 1:36 PM           Slit Lamp and Fundus Exam     Slit Lamp Exam       Right Left   Lids/Lashes Dermato, mild MGD Dermato, mild MGD   Conjunctiva/Sclera Temporal pinguecula, mild inferior sub conj heme Temporal pinguecula   Cornea EBMD, mild haze, trace PEE, mild Debris in tear film, well healed cataract wound trace haze, trace PEE, well healed temporal cataract wounds, arcus   Anterior Chamber Deep and clear; narrow temporal angle Deep and clear   Iris Round and dilated, mild anterior bowing, No NVI Round and moderately dilated to 5.61mm   Lens PCIOL in good position, trace PCO PC IOL in good position with open PC   Anterior Vitreous Synerisis Synerisis         Fundus Exam       Right Left   Disc Mild pallor, sharp rim, +cupping, thin inferior rim Mild pallor, sharp rim, +cupping, +PPA, punctate heme at 0900   C/D Ratio 0.7  0.6   Macula Flat, blunted foveal reflex, cystic changes - slightly improved, RPE mottling and clumping, no heme, Drusen, punctate Exudates -- slightly improved Flat, good foveal reflex, no heme, cystic changes -- stably improved   Vessels mild attenuation, mild tortuosity Attenuated, mild av crossing changes, mild tortuousity   Periphery Attached, rare MA, forcal DBH nasal to disc--improved Attached. No heme.           Refraction     Wearing Rx       Sphere Cylinder Axis Add   Right -0.25 +2.00 158 +2.50   Left -1.00 +1.25 008 +2.50           IMAGING AND PROCEDURES  Imaging and Procedures for 07/12/2023  OCT, Retina - OU - Both Eyes       Right Eye Quality was good. Central Foveal Thickness: 298. Progression has improved. Findings include no SRF, abnormal foveal contour, intraretinal fluid, vitreomacular adhesion (persistent IRF/IRHM -- slightly improved,  partial PVD).   Left Eye Quality was good. Central Foveal Thickness: 303. Progression has been stable. Findings include normal foveal contour, no IRF, no SRF, intraretinal hyper-reflective material (Stable improvement in cystic changes and punctate IRHM nasal fovea; partial PVD).   Notes *Images captured and stored on drive  Diagnosis / Impression:  +DME OU OD: persistent IRF/IRHM -- slightly improved, partial PVD OS: Stable improvement in cystic changes and punctate IRHM nasal fovea; partial PVD  Clinical management:  See below  Abbreviations: NFP - Normal foveal profile. CME - cystoid macular edema. PED - pigment epithelial detachment. IRF - intraretinal fluid. SRF - subretinal fluid. EZ - ellipsoid zone. ERM - epiretinal membrane. ORA - outer retinal atrophy. ORT - outer retinal tubulation. SRHM - subretinal hyper-reflective material. IRHM - intraretinal hyper-reflective material     Intravitreal Injection, Pharmacologic Agent - OD - Right Eye       Time Out 07/12/2023. 2:41 PM. Confirmed correct patient, procedure, site, and patient consented.   Anesthesia Topical anesthesia was used. Anesthetic medications included Lidocaine 2%, Proparacaine 0.5%.   Procedure Preparation included 5% betadine to ocular surface, eyelid speculum. A supplied (32g) needle was used.   Injection: 6 mg faricimab-svoa 6 MG/0.05ML   Route: Intravitreal, Site: Right Eye   NDC: 78295-621-30, Lot: [id number], Expiration date: 07/14/2024, Waste: 0 mL   Post-op Post injection exam found visual acuity of at least counting fingers. The patient tolerated the procedure well. There were no complications. The patient received written and verbal post procedure care education.            ASSESSMENT/PLAN:   ICD-10-CM   1. 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, associated with type 2 diabetes mellitus (HCC)  E11.3213 OCT, Retina - OU - Both Eyes    Intravitreal  Injection, Pharmacologic Agent - OD - Right Eye    faricimab-svoa (VABYSMO) 6mg /0.53mL intravitreal injection    2. Current use of insulin (HCC)  Z79.4     3. Long term (current) use of oral hypoglycemic drugs  Z79.84     4. Essential hypertension  I10     5. Hypertensive retinopathy of both eyes  H35.033     6. Pseudophakia of both eyes  Z96.1     7. 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 History of herpes zoster of eye  Z86.19     9. Primary open angle glaucoma of both eyes, unspecified glaucoma stage  H40.1130  1-3. 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 Single focal MA superior to fovea; OS: Perifoveal Mas w/ late leakage - s/p IVA OS # 1(07.26.21), #2 (08.24.21), #3 (09.21.21), #4 (10.22.21) -- IVA resistance ========================================================== - s/p IVE OD #1 (09.29.23), #2 (10.30.23), #3 (SAMPLE) (11.27.23) #4 (12.29.23) #5 (01.26.24) #6 (02.23.24) -- IVE resistance - s/p IVE OS #1 (11.22.21), #2 (12.21.21), #3 (01.20.22), #4 (02.18.22), #5 (03.18.22), #6 (04.15.22), #7 (05.10.22) -- sample, #8 (06.08.22), #9 (07.12.22), #10 (08.12.22), #11 (09.16.22), #12 (10.21.22), #13 (11.18.22), #14 (12.21.22), #15 (01.20.23), #16 (02.17.23), #17 (03.24.23), #18 (04.21.23), #19 (05.26.23), #20 (06.23.23) -- IVE resistance =========================================================== - s/p IVV OD #1 (03.22.24), #2 (04.19.24), #3 (05.17.24), #4 (06.18.24), #5 (07.19.24), #6 (08.23.24) - s/p IVV OS #1 (07.21.23), #2 (08.25.23), #3 (09.29.23), #4 (10.30.23), #5 (11.27.23) #6 (12.29.23), #7 (01.26.23), #8 (02.23.24), #9 (03.22.24), #10(04.19.24), #11 (05.17.24) - BCVA 20/25 OU - OCT shows OD: persistent IRF/IRHM -- slightly improved, partial PVD; OS: Stable improvement in cystic changes and punctate IRHM nasal fovea; partial PVD at 5 weeks - recommend IVV OD #7  today, 09.27.24 with f/u in 5  weeks - recommend holding injection OS again today -- pt in agreement  - pt wishes to proceed with injection OD  - RBA of procedure discussed, questions answered  - Vabysmo informed consent obtained and signed, 03.22.24 (OD)  - Vabysmo informed consent obtained and signed, 07.21.23 (OS) - Eylea approved until 10/04/23 - [employer] approved Vabysmo - see procedure note - f/u 5 weeks -- DFE/OCT, possible injection(s)  4,5. Hypertensive retinopathy OU - discussed importance of tight BP control - continue to monitor  6. Pseudophakia OU  - s/p CE/IOL OS (2020, Dr. Alben Spittle)             - s/p CE/IOL OD (2022, Dr. Alben Spittle)  - s/p YAG cap OS (04.04.23) - BCVA 20/30 OD and 20/25 OS from 20/150  - IOLs in good position, doing well  - continue to monitor  7. 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 Valere Dross in August 2022  - s/p SuperK OS on 12.12.2022  8. History of herpes zoster/iritis OS  - on Valtrex 1 g daily---maintenance  9. POAG OU  - was under the expert management of Dr. Alben Spittle, now follows with Dr. Zenaida Niece  - IOP 19,18  - continue Cosopt bid OU  Ophthalmic Meds Ordered this visit:  Meds ordered this encounter  Medications   faricimab-svoa (VABYSMO) 6mg /0.47mL intravitreal injection     This document serves as a record of services personally performed by Karie Chimera, MD, PhD. It was created on their behalf by Charlette Caffey, COT an ophthalmic technician. The creation of this record is the provider's dictation and/or activities during the visit.    Electronically signed by:  Charlette Caffey, COT  07/12/23 3:30 PM  This document serves as a record of services personally performed by Karie Chimera, MD, PhD. It was created on their behalf by Glee Arvin. Manson Passey, OA an ophthalmic technician. The creation of this record is the provider's dictation and/or activities during the visit.    Electronically signed by: Glee Arvin. Manson Passey, OA 07/12/23 3:30 PM  Karie Chimera, M.D.,  Ph.D. Diseases & Surgery of the Retina and Vitreous [facility]  I have reviewed the above documentation for accuracy and completeness, and I agree with the above. Karie Chimera, M.D., Ph.D. 07/12/23 3:31 PM   Abbreviations:  M myopia (nearsighted); A astigmatism; H hyperopia (farsighted); P presbyopia; Mrx spectacle prescription;  CTL contact lenses; OD right eye; OS left eye; OU both eyes  XT exotropia; ET esotropia; PEK punctate epithelial keratitis; PEE punctate epithelial erosions; DES dry eye syndrome; MGD meibomian gland dysfunction; ATs artificial tears; PFAT's preservative free artificial tears; NSC nuclear sclerotic cataract; PSC posterior subcapsular cataract; ERM epi-retinal membrane; PVD posterior vitreous detachment; RD retinal detachment; DM diabetes mellitus; DR diabetic retinopathy; NPDR non-proliferative diabetic retinopathy; PDR proliferative diabetic retinopathy; CSME clinically significant macular edema; DME diabetic macular edema; dbh dot blot hemorrhages; CWS cotton wool spot; POAG primary open angle glaucoma; C/D cup-to-disc ratio; HVF humphrey visual field; GVF goldmann visual field; OCT optical coherence tomography; IOP intraocular pressure; BRVO Branch retinal vein occlusion; CRVO central retinal vein occlusion; CRAO central retinal artery occlusion; BRAO branch retinal artery occlusion; RT retinal tear; SB scleral buckle; PPV pars plana vitrectomy; VH Vitreous hemorrhage; PRP panretinal laser photocoagulation; IVK intravitreal kenalog; VMT vitreomacular traction; MH Macular hole;  NVD neovascularization of the disc; NVE neovascularization elsewhere; AREDS age related eye disease study; ARMD age related macular degeneration; POAG primary open angle glaucoma; EBMD epithelial/anterior basement membrane dystrophy; ACIOL anterior chamber intraocular lens; IOL intraocular lens; PCIOL posterior chamber intraocular lens; Phaco/IOL phacoemulsification with  intraocular lens placement; PRK photorefractive keratectomy; LASIK laser assisted in situ keratomileusis; HTN hypertension; DM diabetes mellitus; COPD chronic obstructive pulmonary disease

## 2023-07-12 ENCOUNTER — Ambulatory Visit (INDEPENDENT_AMBULATORY_CARE_PROVIDER_SITE_OTHER): Payer: Medicare Other | Admitting: Ophthalmology

## 2023-07-12 ENCOUNTER — Encounter (INDEPENDENT_AMBULATORY_CARE_PROVIDER_SITE_OTHER): Payer: Self-pay | Admitting: Ophthalmology

## 2023-07-12 DIAGNOSIS — H401130 Primary open-angle glaucoma, bilateral, stage unspecified: Secondary | ICD-10-CM

## 2023-07-12 DIAGNOSIS — E113213 Type 2 diabetes mellitus with mild nonproliferative diabetic retinopathy with macular edema, bilateral: Secondary | ICD-10-CM

## 2023-07-12 DIAGNOSIS — Z7984 Long term (current) use of oral hypoglycemic drugs: Secondary | ICD-10-CM

## 2023-07-12 DIAGNOSIS — Z794 Long term (current) use of insulin: Secondary | ICD-10-CM | POA: Diagnosis not present

## 2023-07-12 DIAGNOSIS — H35033 Hypertensive retinopathy, bilateral: Secondary | ICD-10-CM

## 2023-07-12 DIAGNOSIS — I10 Essential (primary) hypertension: Secondary | ICD-10-CM | POA: Diagnosis not present

## 2023-07-12 DIAGNOSIS — H18523 Epithelial (juvenile) corneal dystrophy, bilateral: Secondary | ICD-10-CM

## 2023-07-12 DIAGNOSIS — Z961 Presence of intraocular lens: Secondary | ICD-10-CM

## 2023-07-12 DIAGNOSIS — Z8619 Personal history of other infectious and parasitic diseases: Secondary | ICD-10-CM

## 2023-07-12 MED ORDER — FARICIMAB-SVOA 6 MG/0.05ML IZ SOSY
6.0000 mg | PREFILLED_SYRINGE | INTRAVITREAL | Status: AC | PRN
Start: 2023-07-12 — End: 2023-07-12
  Administered 2023-07-12: 6 mg via INTRAVITREAL

## 2023-07-18 ENCOUNTER — Ambulatory Visit: Payer: Medicare Other | Admitting: Internal Medicine

## 2023-07-18 ENCOUNTER — Encounter: Payer: Self-pay | Admitting: Internal Medicine

## 2023-07-18 VITALS — BP 152/74 | HR 65 | Ht 70.0 in | Wt 245.6 lb

## 2023-07-18 DIAGNOSIS — I2510 Atherosclerotic heart disease of native coronary artery without angina pectoris: Secondary | ICD-10-CM

## 2023-07-18 DIAGNOSIS — D500 Iron deficiency anemia secondary to blood loss (chronic): Secondary | ICD-10-CM

## 2023-07-18 DIAGNOSIS — I5042 Chronic combined systolic (congestive) and diastolic (congestive) heart failure: Secondary | ICD-10-CM

## 2023-07-18 DIAGNOSIS — Z23 Encounter for immunization: Secondary | ICD-10-CM | POA: Diagnosis not present

## 2023-07-18 DIAGNOSIS — E782 Mixed hyperlipidemia: Secondary | ICD-10-CM

## 2023-07-18 DIAGNOSIS — E1169 Type 2 diabetes mellitus with other specified complication: Secondary | ICD-10-CM | POA: Diagnosis not present

## 2023-07-18 DIAGNOSIS — I480 Paroxysmal atrial fibrillation: Secondary | ICD-10-CM

## 2023-07-18 DIAGNOSIS — G720 Drug-induced myopathy: Secondary | ICD-10-CM

## 2023-07-18 DIAGNOSIS — J309 Allergic rhinitis, unspecified: Secondary | ICD-10-CM | POA: Insufficient documentation

## 2023-07-18 DIAGNOSIS — Z794 Long term (current) use of insulin: Secondary | ICD-10-CM

## 2023-07-18 DIAGNOSIS — E113213 Type 2 diabetes mellitus with mild nonproliferative diabetic retinopathy with macular edema, bilateral: Secondary | ICD-10-CM

## 2023-07-18 DIAGNOSIS — I10 Essential (primary) hypertension: Secondary | ICD-10-CM

## 2023-07-18 DIAGNOSIS — T466X5A Adverse effect of antihyperlipidemic and antiarteriosclerotic drugs, initial encounter: Secondary | ICD-10-CM | POA: Insufficient documentation

## 2023-07-18 MED ORDER — FLUTICASONE PROPIONATE 50 MCG/ACT NA SUSP
2.0000 | Freq: Every day | NASAL | 1 refills | Status: DC
Start: 2023-07-18 — End: 2023-08-12

## 2023-07-18 NOTE — Assessment & Plan Note (Signed)
On Aspirin and Lopid On Coreg Does not tolerate statin Followed by Cardiology

## 2023-07-18 NOTE — Assessment & Plan Note (Signed)
Due to Eliquis in the past Has had iron transfusions Followed by Hematology

## 2023-07-18 NOTE — Assessment & Plan Note (Addendum)
Lab Results  Component Value Date   HGBA1C 6.7 (H) 08/01/2022   Well-controlled Associated with diabetic retinopathy, HTN, CAD and HLD On Lantus 38 U at bedtime, Metformin 1000 mg BID and Glimepiride 2 mg once daily Does not want to start GLP1 agonist Advised to follow diabetic diet On ACEi F/u CMP and lipid panel Diabetic eye exam: Advised to follow up with Ophthalmology for diabetic eye exam

## 2023-07-18 NOTE — Assessment & Plan Note (Signed)
Followed by Dr. Vanessa Barbara

## 2023-07-18 NOTE — Assessment & Plan Note (Signed)
BMI Readings from Last 3 Encounters:  07/18/23 35.24 kg/m  05/28/23 34.81 kg/m  05/27/23 34.35 kg/m   Associated with HTN, DM, OSA Advised to follow low carb diet

## 2023-07-18 NOTE — Assessment & Plan Note (Signed)
On Coreg and lisinopril Has chronic leg swelling and orthopnea On Torsemide 20 mg BID - advised to take additional tablet for 2 days due to recent worsening of leg swelling and orthopnea

## 2023-07-18 NOTE — Assessment & Plan Note (Signed)
Chronic nasal congestion Prescribed Flonase

## 2023-07-18 NOTE — Assessment & Plan Note (Signed)
BP Readings from Last 1 Encounters:  07/18/23 (!) 152/74   Uncontrolled with lisinopril 20 mg BID, Coreg 3.125 mg BID and Torsemide 20 mg BID Appears to be volume overloaded, advised to take additional dose of Torsemide If persistently elevated BP, can add Hydralazine Counseled for compliance with the medications Advised DASH diet and moderate exercise/walking, at least 150 mins/week

## 2023-07-18 NOTE — Assessment & Plan Note (Signed)
Currently in sinus rhythm Rate controlled with Coreg 3.125 mg BID Has watchman device in place, had GI bleeding due to Eliquis

## 2023-07-18 NOTE — Assessment & Plan Note (Signed)
Has had myalgias with statin

## 2023-07-18 NOTE — Progress Notes (Addendum)
New Patient Office Visit  Subjective:  Patient ID: Lucas Dudley, male    DOB: 03-11-1941  Age: 82 y.o. MRN: [id number]  CC:  Chief Complaint  Patient presents with   Establish Care    HPI Lucas Dudley is a 82 y.o. male with past medical history of an HTN, CAD, HFrEF, A-fib s/p watchman device, type II DM with retinopathy, HLD, OSA, prostate ca. and morbid obesity who presents for establishing care.  HTN, CAD, A Fib and CHF: His BP was elevated today. He takes lisinopril 20 mg BID, Coreg 3.125 mg BID and Torsemide 20 mg BID. He also takes Amiodarone 200 mg BID for A Fib. He has watchman device in place. Takes Aspirin for h/o CAD. Followed by Cardiology. He reports chronic leg swelling and orthopnea.  Type 2 DM: He takes Basaglar 94 U-39 U on alternate days (?), Metformin 1000 mg BID and Glimepiride 2 mg at bedtime. He has tried Ozempic in the past, but had supply issues. He denies trying Ozempic again. He checks blood glucose regularly, which have been mostly around 100-140. Denies any recent hypoglycemia. He had myalgias with statin, takes Gemfibrozil currently. He is followed by Retina specialist for diabetic retinopathy.  He uses CPAP regularly for OSA.  He states that he has had pneumococcal vaccine.    Past Medical History:  Diagnosis Date   Arthritis    Atrial fibrillation (HCC)    CAD (coronary artery disease)    a. Cath 03/17/15 showing 100% ostial D1, 50% prox LAD to mid LAD, 40% RPDA stenosis. Med rx. // Myoview 01/2020: EF 31 diffuse perfusion defect without reversibility (suspect artifact); reviewed with Dr. Tama High study felt to be low risk   Cataract    Mixed form OD   Chronic diastolic CHF (congestive heart failure) (HCC)    Diabetic retinopathy (HCC)    NPDR OU   Essential hypertension    Glaucoma    POAG OU   History of gout    Hyperlipidemia    Hypertensive retinopathy    OU   Kidney stones    Melanoma of neck (HCC)    NICM (nonischemic  cardiomyopathy) (HCC)    OSA on CPAP 2012   Prostate cancer (HCC)    Type II diabetes mellitus (HCC)     Past Surgical History:  Procedure Laterality Date   ABDOMINAL HERNIA REPAIR     w/mesh   ATRIAL FIBRILLATION ABLATION N/A 06/06/2021   Procedure: ATRIAL FIBRILLATION ABLATION;  Surgeon: Hillis Range, MD;  Location: [facility];  Service: Cardiovascular;  Laterality: N/A;   BIOPSY  05/24/2022   Procedure: BIOPSY;  Surgeon: Corbin Ade, MD;  Location: [facility];  Service: Endoscopy;;   BIOPSY  05/30/2022   Procedure: BIOPSY;  Surgeon: Corbin Ade, MD;  Location: [facility];  Service: Endoscopy;;   BIOPSY  01/10/2023   Procedure: BIOPSY;  Surgeon: Lemar Lofty., MD;  Location: [facility];  Service: Gastroenterology;;   CARDIAC CATHETERIZATION N/A 03/17/2015   Procedure: Left Heart Cath and Coronary Angiography;  Surgeon: Runell Gess, MD;  Location: [facility] INVASIVE CV LAB;  Service: Cardiovascular;  Laterality: N/A;   CARDIOVERSION N/A 08/09/2021   Procedure: CARDIOVERSION;  Surgeon: Chilton Si, MD;  Location: [facility] ENDOSCOPY;  Service: Cardiovascular;  Laterality: N/A;   CARDIOVERSION N/A 11/10/2021   Procedure: CARDIOVERSION;  Surgeon: Thomasene Ripple, DO;  Location: [facility]OS[facility];  Service: Cardiovascular;  Laterality: N/A;   carotid doppler  09/17/2008  rigt and left ICAs 0-49%;mildly  abnormal   CATARACT EXTRACTION Left 2020   Dr. Alben Spittle   COLONOSCOPY N/A 05/16/2017   Procedure: COLONOSCOPY;  Surgeon: Malissa Hippo, MD;  Location: [facility];  Service: Endoscopy;  Laterality: N/A;  930   COLONOSCOPY WITH PROPOFOL N/A 05/24/2022   Procedure: COLONOSCOPY WITH PROPOFOL;  Surgeon: Corbin Ade, MD;  Location: [facility];  Service: Endoscopy;  Laterality: N/A;   DOPPLER ECHOCARDIOGRAPHY  05/25/2009   EF 50-55%,LA mildly dilated, LV function normal   ELECTROPHYSIOLOGIC STUDY N/A 04/05/2015   Procedure: Cardioversion;  Surgeon: Thurmon Fair, MD;   Location: [facility];  Service: Cardiovascular;  Laterality: N/A;   ELECTROPHYSIOLOGIC STUDY N/A 09/06/2015   Procedure: Atrial Fibrillation Ablation;  Surgeon: Hillis Range, MD;  Location: [facility] INVASIVE CV LAB;  Service: Cardiovascular;  Laterality: N/A;   ELECTROPHYSIOLOGIC STUDY N/A 07/12/2016   redo afib ablation by Dr Johney Frame   ENTEROSCOPY N/A 05/30/2022   Procedure: ENTEROSCOPY;  Surgeon: Corbin Ade, MD;  Location: [facility];  Service: Endoscopy;  Laterality: N/A;   ESOPHAGOGASTRODUODENOSCOPY (EGD) WITH PROPOFOL N/A 05/24/2022   Procedure: ESOPHAGOGASTRODUODENOSCOPY (EGD) WITH PROPOFOL;  Surgeon: Corbin Ade, MD;  [facility]: [facility];  Service: Endoscopy;  Laterality: N/A;   ESOPHAGOGASTRODUODENOSCOPY (EGD) WITH PROPOFOL N/A 01/10/2023   Procedure: ESOPHAGOGASTRODUODENOSCOPY (EGD) WITH PROPOFOL;  Surgeon: Meridee Score Netty Starring., MD;  Location: [facility] ENDOSCOPY;  Service: Gastroenterology;  Laterality: N/A;   EUS N/A 01/10/2023   Procedure: UPPER ENDOSCOPIC ULTRASOUND (EUS) RADIAL;  Surgeon: Lemar Lofty., MD;  Location: [facility] ENDOSCOPY;  Service: Gastroenterology;  Laterality: N/A;   EXCISIONAL HEMORRHOIDECTOMY     "inside and out"   EYE SURGERY Left 2020   Cat Sx - Dr. Alben Spittle   FINE NEEDLE ASPIRATION Right    knee; "drew ~ 1 quart off"   GIVENS CAPSULE STUDY N/A 05/25/2022   Procedure: GIVENS CAPSULE STUDY;  Surgeon: Lanelle Bal, DO;  Location: [facility];  Service: Endoscopy;  Laterality: N/A;   GIVENS CAPSULE STUDY N/A 01/23/2023   Procedure: GIVENS CAPSULE STUDY;  Surgeon: Dolores Frame, MD;  Location: [facility];  Service: Gastroenterology;  Laterality: N/A;  8:30am   HERNIA REPAIR     HOT HEMOSTASIS N/A 01/10/2023   Procedure: HOT HEMOSTASIS (ARGON PLASMA COAGULATION/BICAP);  Surgeon: Lemar Lofty., MD;  Location: [facility];  Service: Gastroenterology;  Laterality: N/A;   LAPAROSCOPIC CHOLECYSTECTOMY     LEFT ATRIAL  APPENDAGE OCCLUSION N/A 06/28/2022   Procedure: LEFT ATRIAL APPENDAGE OCCLUSION;  Surgeon: Tonny Bollman, MD;  Location: [facility] INVASIVE CV LAB;  Service: Cardiovascular;  Laterality: N/A;   MELANOMA EXCISION Right    "neck"   NM MYOCAR PERF WALL MOTION  02/21/2012   EF 61% ,EXERCISE 7 METS. exercise stopped due to wheezing and shortness of breathe   POLYPECTOMY  05/16/2017   Procedure: POLYPECTOMY;  Surgeon: Malissa Hippo, MD;  [facility]: [facility];  Service: Endoscopy;;  colon   POLYPECTOMY  05/24/2022   Procedure: POLYPECTOMY;  Surgeon: Corbin Ade, MD;  [facility]: [facility];  Service: Endoscopy;;   POLYPECTOMY  01/10/2023   Procedure: POLYPECTOMY;  Surgeon: Meridee Score Netty Starring., MD;  Location: [facility];  Service: Gastroenterology;;   PROSTATECTOMY     SHOULDER OPEN ROTATOR CUFF REPAIR Right X 2   SUBMUCOSAL TATTOO INJECTION  05/30/2022   Procedure: SUBMUCOSAL TATTOO INJECTION;  Surgeon: Corbin Ade, MD;  Location: [facility];  Service:  Endoscopy;;   SUBMUCOSAL TATTOO INJECTION  01/10/2023   Procedure: SUBMUCOSAL TATTOO INJECTION;  Surgeon: Lemar Lofty., MD;  Location: [facility];  Service: Gastroenterology;;   TEE WITHOUT CARDIOVERSION N/A 09/05/2015   Procedure: TRANSESOPHAGEAL ECHOCARDIOGRAM (TEE);  Surgeon: Quintella Reichert, MD;  Location: [facility] ENDOSCOPY;  Service: Cardiovascular;  Laterality: N/A;   TEE WITHOUT CARDIOVERSION N/A 06/28/2022   Procedure: TRANSESOPHAGEAL ECHOCARDIOGRAM (TEE);  Surgeon: Tonny Bollman, MD;  Location: [facility] INVASIVE CV LAB;  Service: Cardiovascular;  Laterality: N/A;    Family History  Problem Relation Age of Onset   Heart disease Mother    Lung cancer Mother    Heart attack Mother 49   Diabetes Father    Heart disease Father    Stroke Brother    Healthy Daughter    Colon cancer Maternal Aunt        56s    Social History   Socioeconomic History   Marital status: Widowed    Spouse name: Not on file   Number of  children: 1   Years of education: Not on file   Highest education level: Not on file  Occupational History   Occupation: Retired  Tobacco Use   Smoking status: Former    Current packs/day: 0.00    Average packs/day: 2.0 packs/day for 27.0 years (54.0 ttl pk-yrs)    Types: Cigarettes    Start date: 3    Quit date: 1992    Years since quitting: 32.7    Passive exposure: Current   Smokeless tobacco: Never   Tobacco comments:    Former smoker 09/27/21  Vaping Use   Vaping status: Never Used  Substance and Sexual Activity   Alcohol use: No    Alcohol/week: 0.0 standard drinks of alcohol    Comment: "used to drink; stopped ~ 2008"   Drug use: No   Sexual activity: Not Currently  Other Topics Concern   Not on file  Social History Narrative   Lives in [place], [place] with wife.   Social Determinants of Health   Financial Resource Strain: Not on file  Food Insecurity: No Food Insecurity (09/11/2022)   Hunger Vital Sign    Worried About Running Out of Food in the Last Year: Never true    Ran Out of Food in the Last Year: Never true  Transportation Needs: No Transportation Needs (09/11/2022)   PRAPARE - Administrator, Civil Service (Medical): No    Lack of Transportation (Non-Medical): No  Physical Activity: Not on file  Stress: Not on file  Social Connections: Not on file  Intimate Partner Violence: Not At Risk (09/11/2022)   Humiliation, Afraid, Rape, and Kick questionnaire    Fear of Current or Ex-Partner: No    Emotionally Abused: No    Physically Abused: No    Sexually Abused: No    ROS Review of Systems  Constitutional:  Negative for chills and fever.  HENT:  Positive for congestion. Negative for sore throat.   Eyes:  Negative for pain and discharge.  Respiratory:  Positive for shortness of breath. Negative for cough.   Cardiovascular:  Positive for leg swelling. Negative for chest pain and palpitations.  Gastrointestinal:  Negative for diarrhea, nausea  and vomiting.  Endocrine: Negative for polydipsia and polyuria.  Genitourinary:  Negative for dysuria and hematuria.  Musculoskeletal:  Negative for neck pain and neck stiffness.  Skin:  Negative for rash.  Neurological:  Negative for dizziness and weakness.  Psychiatric/Behavioral:  Negative for agitation  and behavioral problems.     Objective:   Today's Vitals: BP (!) 152/74 (BP Location: Right Arm, Patient Position: Sitting, Cuff Size: Large)   Pulse 65   Ht 5\' 10"  (1.778 m)   Wt 245 lb 9.6 oz (111.4 kg)   SpO2 90%   BMI 35.24 kg/m   Physical Exam Vitals reviewed.  Constitutional:      General: He is not in acute distress.    Appearance: He is obese. He is not diaphoretic.  HENT:     Head: Normocephalic and atraumatic.     Nose: Nose normal.     Mouth/Throat:     Mouth: Mucous membranes are moist.  Eyes:     General: No scleral icterus.    Extraocular Movements: Extraocular movements intact.  Cardiovascular:     Rate and Rhythm: Normal rate and regular rhythm.     Heart sounds: Normal heart sounds. No murmur heard. Pulmonary:     Breath sounds: Normal breath sounds. No wheezing or rales.  Musculoskeletal:     Cervical back: Neck supple. No tenderness.     Right lower leg: Edema (2+) present.     Left lower leg: Edema (2+) present.  Skin:    General: Skin is warm.     Findings: No rash.  Neurological:     General: No focal deficit present.     Mental Status: He is alert and oriented to person, place, and time.  Psychiatric:        Mood and Affect: Mood normal.        Behavior: Behavior normal.     Assessment & Plan:   Problem List Items Addressed This Visit       Cardiovascular and Mediastinum   Essential hypertension    BP Readings from Last 1 Encounters:  07/18/23 (!) 152/74   Uncontrolled with lisinopril 20 mg BID, Coreg 3.125 mg BID and Torsemide 20 mg BID Appears to be volume overloaded, advised to take additional dose of Torsemide If  persistently elevated BP, can add Hydralazine Counseled for compliance with the medications Advised DASH diet and moderate exercise/walking, at least 150 mins/week       Paroxysmal atrial fibrillation (HCC)    Currently in sinus rhythm Rate controlled with Coreg 3.125 mg BID Has watchman device in place, had GI bleeding due to Eliquis      Chronic combined systolic and diastolic CHF (congestive heart failure) (HCC)    On Coreg and lisinopril Has chronic leg swelling and orthopnea On Torsemide 20 mg BID - advised to take additional tablet for 2 days due to recent worsening of leg swelling and orthopnea      CAD in native artery    On Aspirin and Lopid On Coreg Does not tolerate statin Followed by Cardiology        Respiratory   Allergic rhinitis    Chronic nasal congestion Prescribed Flonase       Relevant Medications   fluticasone (FLONASE) 50 MCG/ACT nasal spray     Endocrine   Type 2 diabetes mellitus with other specified complication (HCC) - Primary    Lab Results  Component Value Date   HGBA1C 6.7 (H) 08/01/2022   Well-controlled Associated with diabetic retinopathy, HTN, CAD and HLD On Lantus 38 U at bedtime, Metformin 1000 mg BID and Glimepiride 2 mg once daily Does not want to start GLP1 agonist Advised to follow diabetic diet On ACEi F/u CMP and lipid panel Diabetic eye exam: Advised to follow up  with Ophthalmology for diabetic eye exam       Relevant Orders   CMP14+EGFR   Hemoglobin A1c   Urine Microalbumin w/creat. 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 Vanessa Barbara        Musculoskeletal and Integument   Statin myopathy    Has had myalgias with statin        Other   Obesity, Class III, BMI 40-49.9 (morbid obesity) (HCC) (Chronic)    BMI Readings from Last 3 Encounters:  07/18/23 35.24 kg/m  05/28/23 34.81 kg/m  05/27/23  34.35 kg/m   Associated with HTN, DM, OSA Advised to follow low carb diet      Hyperlipidemia    On Lopid Check lipid profile      Relevant Orders   Lipid Profile   Anemia due to chronic blood loss    Due to Eliquis in the past Has had iron transfusions Followed by Hematology      Other Visit Diagnoses     Encounter for immunization       Relevant Orders   Flu Vaccine Trivalent High Dose (Fluad) (Completed)       Outpatient Encounter Medications as of 07/18/2023  Medication Sig   acetaminophen (TYLENOL) 650 MG CR tablet Take 1,300 mg by mouth every 8 (eight) hours as needed for pain.   amiodarone (PACERONE) 200 MG tablet TAKE 1 TABLET BY MOUTH EVERY DAY   Artificial Tear Ointment (DRY EYES OP) Place 1 drop into both eyes daily as needed (Dry eye).   Ascorbic Acid (VITAMIN C) 100 MG tablet Take 100 mg by mouth daily.   BERBERINE CHLORIDE PO Take by mouth daily.   camphor-menthol (ANTI-ITCH) lotion Apply 1 Application topically daily as needed for itching.   carvedilol (COREG) 3.125 MG tablet TAKE 1 TABLET BY MOUTH 2 TIMES DAILY.   COMBIGAN 0.2-0.5 % ophthalmic solution Place 1 drop into both eyes 2 (two) times daily.   CRANBERRY PO Take 1 tablet by mouth daily.   cyanocobalamin (VITAMIN B12) 1000 MCG tablet Take 1 tablet (1,000 mcg total) by mouth daily.   ELDERBERRY PO Take 2 tablets by mouth daily. Chewable   fluticasone (FLONASE) 50 MCG/ACT nasal spray Place 2 sprays into both nostrils daily.   gemfibrozil (LOPID) 600 MG tablet Take 600 mg by mouth at bedtime.    glimepiride (AMARYL) 2 MG tablet Take 2 mg by mouth daily in the afternoon.   Insulin Glargine (BASAGLAR KWIKPEN) 100 UNIT/ML Inject 38 Units into the skin at bedtime.   KLOR-CON M20 20 MEQ tablet TAKE 1 TABLET BY MOUTH EVERY DAY   magnesium gluconate (MAGONATE) 500 MG tablet Take 500 mg by mouth daily.   metFORMIN (GLUCOPHAGE-XR) 500 MG 24 hr tablet Take 1,000 mg by mouth in the morning and at bedtime.    mineral oil liquid Take 15 mLs by mouth daily as needed for moderate constipation.   Multiple Vitamins-Minerals (MULTIVITAMIN WITH MINERALS) tablet Take 1 tablet by mouth daily. Centrum men 50+   Omega-3 1000 MG CAPS Take 1,000 mg by mouth daily.   OVER THE COUNTER MEDICATION Take 1 tablet by mouth daily. Beets Chew   pantoprazole (PROTONIX) 40 MG tablet TAKE 1 TABLET BY MOUTH EVERY DAY   torsemide (DEMADEX) 20 MG tablet TAKE 1 TABLET BY MOUTH TWICE A DAY   triamcinolone cream (KENALOG) 0.1 % Apply 1 Application topically 2 (two)  times daily as needed (itch).   valACYclovir (VALTREX) 1000 MG tablet Take 1,000 mg by mouth at bedtime.   aspirin 81 MG chewable tablet Chew 81 mg by mouth daily.   ferrous sulfate 325 (65 FE) MG tablet Take 1 tablet (325 mg total) by mouth daily with breakfast.   lisinopril (ZESTRIL) 20 MG tablet TAKE 1 TABLET BY MOUTH EVERY DAY (Patient taking differently: Take 20 mg by mouth in the morning and at bedtime.)   Facility-Administered Encounter Medications as of 07/18/2023  Medication   0.9 %  sodium chloride infusion   0.9 %  sodium chloride infusion    Follow-up: Return in about 3 weeks (around 08/08/2023) for Annual physical.   Anabel Halon, MD

## 2023-07-18 NOTE — Patient Instructions (Addendum)
Please take Torsemide 20 mg additional tablet for next 2 days - 2 tablets in the morning and 1 tablet in the evening.  Please continue to take other medications as prescribed.  Please continue to follow low carb diet and ambulate as tolerated.  Please perform leg elevation as tolerated.

## 2023-07-18 NOTE — Addendum Note (Signed)
Addended byTrena Platt on: 07/18/2023 09:09 PM   Modules accepted: Orders

## 2023-07-18 NOTE — Assessment & Plan Note (Signed)
On Lopid Check lipid profile

## 2023-07-19 ENCOUNTER — Other Ambulatory Visit: Payer: Self-pay | Admitting: Internal Medicine

## 2023-07-24 LAB — MICROALBUMIN / CREATININE URINE RATIO
Creatinine, Urine: 141.8 mg/dL
Microalb/Creat Ratio: 74 mg/g{creat} — ABNORMAL HIGH (ref 0–29)
Microalbumin, Urine: 105.1 ug/mL

## 2023-07-24 LAB — CMP14+EGFR
ALT: 14 [IU]/L (ref 0–44)
AST: 16 [IU]/L (ref 0–40)
Albumin: 4.1 g/dL (ref 3.8–4.8)
Alkaline Phosphatase: 73 [IU]/L (ref 44–121)
BUN/Creatinine Ratio: 22 (ref 10–24)
BUN: 27 mg/dL (ref 8–27)
Bilirubin Total: 0.4 mg/dL (ref 0.0–1.2)
CO2: 27 mmol/L (ref 20–29)
Calcium: 9.3 mg/dL (ref 8.6–10.2)
Chloride: 96 mmol/L (ref 96–106)
Creatinine, Ser: 1.22 mg/dL (ref 0.76–1.27)
Globulin, Total: 2.6 g/dL (ref 1.5–4.5)
Glucose: 188 mg/dL — ABNORMAL HIGH (ref 70–99)
Potassium: 5.4 mmol/L — ABNORMAL HIGH (ref 3.5–5.2)
Sodium: 140 mmol/L (ref 134–144)
Total Protein: 6.7 g/dL (ref 6.0–8.5)
eGFR: 60 mL/min/{1.73_m2} (ref >59)

## 2023-07-24 LAB — LIPID PANEL
Chol/HDL Ratio: 6.1 {ratio} — ABNORMAL HIGH (ref 0.0–5.0)
Cholesterol, Total: 147 mg/dL (ref 100–199)
HDL: 24 mg/dL — ABNORMAL LOW (ref >39)
LDL Chol Calc (NIH): 87 mg/dL (ref 0–99)
Triglycerides: 213 mg/dL — ABNORMAL HIGH (ref 0–149)
VLDL Cholesterol Cal: 36 mg/dL (ref 5–40)

## 2023-07-24 LAB — HEMOGLOBIN A1C
Est. average glucose Bld gHb Est-mCnc: 237 mg/dL
Hgb A1c MFr Bld: 9.9 % — ABNORMAL HIGH (ref 4.8–5.6)

## 2023-08-06 ENCOUNTER — Inpatient Hospital Stay: Payer: Medicare Other | Attending: Hematology

## 2023-08-06 DIAGNOSIS — D500 Iron deficiency anemia secondary to blood loss (chronic): Secondary | ICD-10-CM

## 2023-08-06 DIAGNOSIS — Z9079 Acquired absence of other genital organ(s): Secondary | ICD-10-CM | POA: Insufficient documentation

## 2023-08-06 DIAGNOSIS — I130 Hypertensive heart and chronic kidney disease with heart failure and stage 1 through stage 4 chronic kidney disease, or unspecified chronic kidney disease: Secondary | ICD-10-CM | POA: Diagnosis not present

## 2023-08-06 DIAGNOSIS — K5900 Constipation, unspecified: Secondary | ICD-10-CM | POA: Diagnosis not present

## 2023-08-06 DIAGNOSIS — E669 Obesity, unspecified: Secondary | ICD-10-CM | POA: Diagnosis not present

## 2023-08-06 DIAGNOSIS — Z823 Family history of stroke: Secondary | ICD-10-CM | POA: Insufficient documentation

## 2023-08-06 DIAGNOSIS — Z800 Family history of malignant neoplasm of digestive organs: Secondary | ICD-10-CM | POA: Insufficient documentation

## 2023-08-06 DIAGNOSIS — Z833 Family history of diabetes mellitus: Secondary | ICD-10-CM | POA: Insufficient documentation

## 2023-08-06 DIAGNOSIS — Z9049 Acquired absence of other specified parts of digestive tract: Secondary | ICD-10-CM | POA: Insufficient documentation

## 2023-08-06 DIAGNOSIS — R200 Anesthesia of skin: Secondary | ICD-10-CM | POA: Diagnosis not present

## 2023-08-06 DIAGNOSIS — N1831 Chronic kidney disease, stage 3a: Secondary | ICD-10-CM | POA: Insufficient documentation

## 2023-08-06 DIAGNOSIS — Z87891 Personal history of nicotine dependence: Secondary | ICD-10-CM | POA: Diagnosis not present

## 2023-08-06 DIAGNOSIS — R42 Dizziness and giddiness: Secondary | ICD-10-CM | POA: Insufficient documentation

## 2023-08-06 DIAGNOSIS — F5089 Other specified eating disorder: Secondary | ICD-10-CM | POA: Insufficient documentation

## 2023-08-06 DIAGNOSIS — Z8546 Personal history of malignant neoplasm of prostate: Secondary | ICD-10-CM | POA: Diagnosis not present

## 2023-08-06 DIAGNOSIS — R0602 Shortness of breath: Secondary | ICD-10-CM | POA: Insufficient documentation

## 2023-08-06 DIAGNOSIS — E1122 Type 2 diabetes mellitus with diabetic chronic kidney disease: Secondary | ICD-10-CM | POA: Diagnosis not present

## 2023-08-06 DIAGNOSIS — I2510 Atherosclerotic heart disease of native coronary artery without angina pectoris: Secondary | ICD-10-CM | POA: Insufficient documentation

## 2023-08-06 DIAGNOSIS — E785 Hyperlipidemia, unspecified: Secondary | ICD-10-CM | POA: Insufficient documentation

## 2023-08-06 DIAGNOSIS — I5032 Chronic diastolic (congestive) heart failure: Secondary | ICD-10-CM | POA: Diagnosis not present

## 2023-08-06 DIAGNOSIS — Z79899 Other long term (current) drug therapy: Secondary | ICD-10-CM | POA: Diagnosis not present

## 2023-08-06 DIAGNOSIS — R5383 Other fatigue: Secondary | ICD-10-CM | POA: Diagnosis not present

## 2023-08-06 DIAGNOSIS — Z8249 Family history of ischemic heart disease and other diseases of the circulatory system: Secondary | ICD-10-CM | POA: Insufficient documentation

## 2023-08-06 DIAGNOSIS — Z801 Family history of malignant neoplasm of trachea, bronchus and lung: Secondary | ICD-10-CM | POA: Insufficient documentation

## 2023-08-06 DIAGNOSIS — D509 Iron deficiency anemia, unspecified: Secondary | ICD-10-CM | POA: Diagnosis present

## 2023-08-06 DIAGNOSIS — Z881 Allergy status to other antibiotic agents status: Secondary | ICD-10-CM | POA: Diagnosis not present

## 2023-08-06 DIAGNOSIS — I4891 Unspecified atrial fibrillation: Secondary | ICD-10-CM | POA: Insufficient documentation

## 2023-08-06 DIAGNOSIS — R197 Diarrhea, unspecified: Secondary | ICD-10-CM | POA: Diagnosis not present

## 2023-08-06 DIAGNOSIS — E538 Deficiency of other specified B group vitamins: Secondary | ICD-10-CM | POA: Insufficient documentation

## 2023-08-06 LAB — CBC
HCT: 36.7 % — ABNORMAL LOW (ref 39.0–52.0)
Hemoglobin: 11.2 g/dL — ABNORMAL LOW (ref 13.0–17.0)
MCH: 28.8 pg (ref 26.0–34.0)
MCHC: 30.5 g/dL (ref 30.0–36.0)
MCV: 94.3 fL (ref 80.0–100.0)
Platelets: 304 10*3/uL (ref 150–400)
RBC: 3.89 MIL/uL — ABNORMAL LOW (ref 4.22–5.81)
RDW: 14.2 % (ref 11.5–15.5)
WBC: 7.5 10*3/uL (ref 4.0–10.5)
nRBC: 0 % (ref 0.0–0.2)

## 2023-08-06 LAB — IRON AND TIBC
Iron: 31 ug/dL — ABNORMAL LOW (ref 45–182)
Saturation Ratios: 7 % — ABNORMAL LOW (ref 17.9–39.5)
TIBC: 458 ug/dL — ABNORMAL HIGH (ref 250–450)
UIBC: 427 ug/dL

## 2023-08-06 LAB — FERRITIN: Ferritin: 19 ng/mL — ABNORMAL LOW (ref 24–336)

## 2023-08-08 ENCOUNTER — Ambulatory Visit (INDEPENDENT_AMBULATORY_CARE_PROVIDER_SITE_OTHER): Payer: Medicare Other | Admitting: Internal Medicine

## 2023-08-08 ENCOUNTER — Encounter: Payer: Self-pay | Admitting: Internal Medicine

## 2023-08-08 VITALS — BP 148/60 | HR 62 | Ht 70.0 in | Wt 246.4 lb

## 2023-08-08 DIAGNOSIS — Z0001 Encounter for general adult medical examination with abnormal findings: Secondary | ICD-10-CM | POA: Diagnosis not present

## 2023-08-08 DIAGNOSIS — K257 Chronic gastric ulcer without hemorrhage or perforation: Secondary | ICD-10-CM | POA: Insufficient documentation

## 2023-08-08 DIAGNOSIS — I10 Essential (primary) hypertension: Secondary | ICD-10-CM | POA: Diagnosis not present

## 2023-08-08 DIAGNOSIS — I480 Paroxysmal atrial fibrillation: Secondary | ICD-10-CM | POA: Diagnosis not present

## 2023-08-08 DIAGNOSIS — D6869 Other thrombophilia: Secondary | ICD-10-CM

## 2023-08-08 DIAGNOSIS — E1169 Type 2 diabetes mellitus with other specified complication: Secondary | ICD-10-CM

## 2023-08-08 DIAGNOSIS — D500 Iron deficiency anemia secondary to blood loss (chronic): Secondary | ICD-10-CM

## 2023-08-08 DIAGNOSIS — Z794 Long term (current) use of insulin: Secondary | ICD-10-CM

## 2023-08-08 MED ORDER — BASAGLAR KWIKPEN 100 UNIT/ML ~~LOC~~ SOPN: 45.0000 [IU] | PEN_INJECTOR | Freq: Every day | SUBCUTANEOUS | 3 refills | Status: DC

## 2023-08-08 MED ORDER — LISINOPRIL 40 MG PO TABS: 40.0000 mg | ORAL_TABLET | Freq: Every day | ORAL | 1 refills | Status: DC

## 2023-08-08 MED ORDER — GLIMEPIRIDE 2 MG PO TABS: 2.0000 mg | ORAL_TABLET | Freq: Two times a day (BID) | ORAL | 3 refills | Status: DC

## 2023-08-08 NOTE — Assessment & Plan Note (Signed)
Physical exam as documented. Fasting blood tests reviewed today.

## 2023-08-08 NOTE — Assessment & Plan Note (Addendum)
Due to Eliquis in the past Has had iron transfusions Continue oral iron supplement Followed by Hematology - may need iron transfusion again

## 2023-08-08 NOTE — Assessment & Plan Note (Addendum)
Currently in sinus rhythm On Amiodarone 200 mg QD Rate controlled with Coreg 3.125 mg BID Has watchman device in place, had GI bleeding due to Eliquis

## 2023-08-08 NOTE — Assessment & Plan Note (Signed)
Noted in EGD On pantoprazole 40 mg QD Considering recent drop in Hb and epigastric discomfort, advised to take pantoprazole twice daily for 2 weeks Contact GI clinic if persistent symptoms

## 2023-08-08 NOTE — Assessment & Plan Note (Signed)
BMI Readings from Last 3 Encounters:  08/08/23 35.35 kg/m  07/18/23 35.24 kg/m  05/28/23 34.81 kg/m   Associated with HTN, DM, OSA Advised to follow low carb diet

## 2023-08-08 NOTE — Progress Notes (Signed)
Established Patient Office Visit  Subjective:  Patient ID: Lucas Dudley, male    DOB: Jun 12, 1941  Age: 82 y.o. MRN: [id number]  CC:  Chief Complaint  Patient presents with   Annual Exam   Anemia    Patient feels he is having low iron     HPI Lucas Dudley is a 82 y.o. male with past medical history of HTN, CAD, HFrEF, A-fib s/p watchman device, type II DM with retinopathy, HLD, OSA, prostate ca. and morbid obesity who presents for annual physical.  HTN, CAD, A Fib and CHF: His BP was elevated today. He takes lisinopril 20 mg BID (although has been prescribed once daily), Coreg 3.125 mg BID and Torsemide 20 mg BID. He also takes Amiodarone 200 mg BID for A Fib. He has watchman device in place. His BP stays around 150s/60s at home as well. Takes Aspirin for h/o CAD. Followed by Cardiology. He reports chronic leg swelling and orthopnea.   Type 2 DM: He takes Basaglar 45 U qHS, Metformin 1000 mg BID and Glimepiride 2 mg at bedtime. He has tried Ozempic in the past, but had supply issues. He denies trying Ozempic again. He checks blood glucose regularly in AM, which have been mostly around 100-140. Denies any recent hypoglycemia. He had myalgias with statin, takes Gemfibrozil currently. He is followed by Retina specialist for diabetic retinopathy.   He uses CPAP regularly for OSA.  Iron deficiency anemia: He has history of GI bleeding due to Eliquis, leading to iron deficiency anemia.  He has had iron transfusions in the past.  He currently reports worsening of fatigue for the last 2 weeks.  Of note, his Hb has dropped from 13.5 (06/24) to 11.2 (10/24).  He denies melena or hematochezia currently.  He takes half tablet of iron supplement as he has bloating and constipation with whole tablet.  He takes pantoprazole 40 mg QD for GERD.  He reports bloating and epigastric discomfort after eating.  He states that he has had pneumococcal vaccine - details unknown.  Past Medical History:  Diagnosis  Date   Arthritis    Atrial fibrillation (HCC)    CAD (coronary artery disease)    a. Cath 03/17/15 showing 100% ostial D1, 50% prox LAD to mid LAD, 40% RPDA stenosis. Med rx. // Myoview 01/2020: EF 31 diffuse perfusion defect without reversibility (suspect artifact); reviewed with Dr. Tama High study felt to be low risk   Cataract    Mixed form OD   Chronic diastolic CHF (congestive heart failure) (HCC)    Diabetic retinopathy (HCC)    NPDR OU   Essential hypertension    Glaucoma    POAG OU   History of gout    Hyperlipidemia    Hypertensive retinopathy    OU   Kidney stones    Melanoma of neck (HCC)    NICM (nonischemic cardiomyopathy) (HCC)    OSA on CPAP 2012   Prostate cancer (HCC)    Type II diabetes mellitus (HCC)     Past Surgical History:  Procedure Laterality Date   ABDOMINAL HERNIA REPAIR     w/mesh   ATRIAL FIBRILLATION ABLATION N/A 06/06/2021   Procedure: ATRIAL FIBRILLATION ABLATION;  Surgeon: Hillis Range, MD;  Location: [facility];  Service: Cardiovascular;  Laterality: N/A;   BIOPSY  05/24/2022   Procedure: BIOPSY;  Surgeon: Corbin Ade, MD;  Location: [facility];  Service: Endoscopy;;   BIOPSY  05/30/2022   Procedure: BIOPSY;  Surgeon: Corbin Ade, MD;  Location: [facility];  Service: Endoscopy;;   BIOPSY  01/10/2023   Procedure: BIOPSY;  Surgeon: Lemar Lofty., MD;  Location: [facility];  Service: Gastroenterology;;   CARDIAC CATHETERIZATION N/A 03/17/2015   Procedure: Left Heart Cath and Coronary Angiography;  Surgeon: Runell Gess, MD;  Location: [facility] INVASIVE CV LAB;  Service: Cardiovascular;  Laterality: N/A;   CARDIOVERSION N/A 08/09/2021   Procedure: CARDIOVERSION;  Surgeon: Chilton Si, MD;  Location: [facility] ENDOSCOPY;  Service: Cardiovascular;  Laterality: N/A;   CARDIOVERSION N/A 11/10/2021   Procedure: CARDIOVERSION;  Surgeon: Thomasene Ripple, DO;  Location: [facility]OSCOPY;  Service: Cardiovascular;  Laterality: N/A;    carotid doppler  09/17/2008   rigt and left ICAs 0-49%;mildly  abnormal   CATARACT EXTRACTION Left 2020   Dr. Alben Spittle   COLONOSCOPY N/A 05/16/2017   Procedure: COLONOSCOPY;  Surgeon: Malissa Hippo, MD;  Location: [facility];  Service: Endoscopy;  Laterality: N/A;  930   COLONOSCOPY WITH PROPOFOL N/A 05/24/2022   Procedure: COLONOSCOPY WITH PROPOFOL;  Surgeon: Corbin Ade, MD;  Location: [facility];  Service: Endoscopy;  Laterality: N/A;   DOPPLER ECHOCARDIOGRAPHY  05/25/2009   EF 50-55%,LA mildly dilated, LV function normal   ELECTROPHYSIOLOGIC STUDY N/A 04/05/2015   Procedure: Cardioversion;  Surgeon: Thurmon Fair, MD;  Location: [facility];  Service: Cardiovascular;  Laterality: N/A;   ELECTROPHYSIOLOGIC STUDY N/A 09/06/2015   Procedure: Atrial Fibrillation Ablation;  Surgeon: Hillis Range, MD;  Location: [facility] INVASIVE CV LAB;  Service: Cardiovascular;  Laterality: N/A;   ELECTROPHYSIOLOGIC STUDY N/A 07/12/2016   redo afib ablation by Dr Johney Frame   ENTEROSCOPY N/A 05/30/2022   Procedure: ENTEROSCOPY;  Surgeon: Corbin Ade, MD;  Location: [facility];  Service: Endoscopy;  Laterality: N/A;   ESOPHAGOGASTRODUODENOSCOPY (EGD) WITH PROPOFOL N/A 05/24/2022   Procedure: ESOPHAGOGASTRODUODENOSCOPY (EGD) WITH PROPOFOL;  Surgeon: Corbin Ade, MD;  [facility]: [facility];  Service: Endoscopy;  Laterality: N/A;   ESOPHAGOGASTRODUODENOSCOPY (EGD) WITH PROPOFOL N/A 01/10/2023   Procedure: ESOPHAGOGASTRODUODENOSCOPY (EGD) WITH PROPOFOL;  Surgeon: Meridee Score Netty Starring., MD;  Location: [facility] ENDOSCOPY;  Service: Gastroenterology;  Laterality: N/A;   EUS N/A 01/10/2023   Procedure: UPPER ENDOSCOPIC ULTRASOUND (EUS) RADIAL;  Surgeon: Lemar Lofty., MD;  Location: [facility] ENDOSCOPY;  Service: Gastroenterology;  Laterality: N/A;   EXCISIONAL HEMORRHOIDECTOMY     "inside and out"   EYE SURGERY Left 2020   Cat Sx - Dr. Alben Spittle   FINE NEEDLE ASPIRATION Right    knee; "drew ~ 1 quart  off"   GIVENS CAPSULE STUDY N/A 05/25/2022   Procedure: GIVENS CAPSULE STUDY;  Surgeon: Lanelle Bal, DO;  Location: [facility];  Service: Endoscopy;  Laterality: N/A;   GIVENS CAPSULE STUDY N/A 01/23/2023   Procedure: GIVENS CAPSULE STUDY;  Surgeon: Dolores Frame, MD;  Location: [facility];  Service: Gastroenterology;  Laterality: N/A;  8:30am   HERNIA REPAIR     HOT HEMOSTASIS N/A 01/10/2023   Procedure: HOT HEMOSTASIS (ARGON PLASMA COAGULATION/BICAP);  Surgeon: Lemar Lofty., MD;  Location: [facility];  Service: Gastroenterology;  Laterality: N/A;   LAPAROSCOPIC CHOLECYSTECTOMY     LEFT ATRIAL APPENDAGE OCCLUSION N/A 06/28/2022   Procedure: LEFT ATRIAL APPENDAGE OCCLUSION;  Surgeon: Tonny Bollman, MD;  Location: [facility] INVASIVE CV LAB;  Service: Cardiovascular;  Laterality: N/A;   MELANOMA EXCISION Right    "neck"   NM MYOCAR PERF WALL MOTION  02/21/2012   EF  61% ,EXERCISE 7 METS. exercise stopped due to wheezing and shortness of breathe   POLYPECTOMY  05/16/2017   Procedure: POLYPECTOMY;  Surgeon: Malissa Hippo, MD;  [facility]: [facility];  Service: Endoscopy;;  colon   POLYPECTOMY  05/24/2022   Procedure: POLYPECTOMY;  Surgeon: Corbin Ade, MD;  [facility]: [facility];  Service: Endoscopy;;   POLYPECTOMY  01/10/2023   Procedure: POLYPECTOMY;  Surgeon: Meridee Score Netty Starring., MD;  Location: [facility];  Service: Gastroenterology;;   PROSTATECTOMY     SHOULDER OPEN ROTATOR CUFF REPAIR Right X 2   SUBMUCOSAL TATTOO INJECTION  05/30/2022   Procedure: SUBMUCOSAL TATTOO INJECTION;  Surgeon: Corbin Ade, MD;  Location: [facility];  Service: Endoscopy;;   SUBMUCOSAL TATTOO INJECTION  01/10/2023   Procedure: SUBMUCOSAL TATTOO INJECTION;  Surgeon: Lemar Lofty., MD;  Location: [facility];  Service: Gastroenterology;;   TEE WITHOUT CARDIOVERSION N/A 09/05/2015   Procedure: TRANSESOPHAGEAL ECHOCARDIOGRAM (TEE);  Surgeon: Quintella Reichert, MD;   Location: [facility] ENDOSCOPY;  Service: Cardiovascular;  Laterality: N/A;   TEE WITHOUT CARDIOVERSION N/A 06/28/2022   Procedure: TRANSESOPHAGEAL ECHOCARDIOGRAM (TEE);  Surgeon: Tonny Bollman, MD;  Location: [facility] INVASIVE CV LAB;  Service: Cardiovascular;  Laterality: N/A;    Family History  Problem Relation Age of Onset   Heart disease Mother    Lung cancer Mother    Heart attack Mother 41   Diabetes Father    Heart disease Father    Stroke Brother    Healthy Daughter    Colon cancer Maternal Aunt        28s    Social History   Socioeconomic History   Marital status: Widowed    Spouse name: Not on file   Number of children: 1   Years of education: Not on file   Highest education level: Not on file  Occupational History   Occupation: Retired  Tobacco Use   Smoking status: Former    Current packs/day: 0.00    Average packs/day: 2.0 packs/day for 27.0 years (54.0 ttl pk-yrs)    Types: Cigarettes    Start date: 23    Quit date: 1992    Years since quitting: 32.8    Passive exposure: Current   Smokeless tobacco: Never   Tobacco comments:    Former smoker 09/27/21  Vaping Use   Vaping status: Never Used  Substance and Sexual Activity   Alcohol use: No    Alcohol/week: 0.0 standard drinks of alcohol    Comment: "used to drink; stopped ~ 2008"   Drug use: No   Sexual activity: Not Currently  Other Topics Concern   Not on file  Social History Narrative   Lives in [place], [place] with wife.   Social Determinants of Health   Financial Resource Strain: Not on file  Food Insecurity: No Food Insecurity (09/11/2022)   Hunger Vital Sign    Worried About Running Out of Food in the Last Year: Never true    Ran Out of Food in the Last Year: Never true  Transportation Needs: No Transportation Needs (09/11/2022)   PRAPARE - Administrator, Civil Service (Medical): No    Lack of Transportation (Non-Medical): No  Physical Activity: Not on file  Stress: Not on file  Social  Connections: Not on file  Intimate Partner Violence: Not At Risk (09/11/2022)   Humiliation, Afraid, Rape, and Kick questionnaire    Fear of Current or Ex-Partner: No    Emotionally Abused: No  Physically Abused: No    Sexually Abused: No    Outpatient Medications Prior to Visit  Medication Sig Dispense Refill   acetaminophen (TYLENOL) 650 MG CR tablet Take 1,300 mg by mouth every 8 (eight) hours as needed for pain.     amiodarone (PACERONE) 200 MG tablet TAKE 1 TABLET BY MOUTH EVERY DAY 90 tablet 1   Artificial Tear Ointment (DRY EYES OP) Place 1 drop into both eyes daily as needed (Dry eye).     Ascorbic Acid (VITAMIN C) 100 MG tablet Take 100 mg by mouth daily.     aspirin 81 MG chewable tablet Chew 81 mg by mouth daily.     BERBERINE CHLORIDE PO Take by mouth daily.     camphor-menthol (ANTI-ITCH) lotion Apply 1 Application topically daily as needed for itching.     carvedilol (COREG) 3.125 MG tablet TAKE 1 TABLET BY MOUTH 2 TIMES DAILY. 180 tablet 3   COMBIGAN 0.2-0.5 % ophthalmic solution Place 1 drop into both eyes 2 (two) times daily.     CRANBERRY PO Take 1 tablet by mouth daily.     cyanocobalamin (VITAMIN B12) 1000 MCG tablet Take 1 tablet (1,000 mcg total) by mouth daily.     ELDERBERRY PO Take 2 tablets by mouth daily. Chewable     ferrous sulfate 325 (65 FE) MG tablet Take 1 tablet (325 mg total) by mouth daily with breakfast. 120 tablet 1   fluticasone (FLONASE) 50 MCG/ACT nasal spray Place 2 sprays into both nostrils daily. 16 g 1   gemfibrozil (LOPID) 600 MG tablet Take 600 mg by mouth at bedtime.      magnesium gluconate (MAGONATE) 500 MG tablet Take 500 mg by mouth daily.     metFORMIN (GLUCOPHAGE-XR) 500 MG 24 hr tablet Take 1,000 mg by mouth in the morning and at bedtime.     mineral oil liquid Take 15 mLs by mouth daily as needed for moderate constipation.     Multiple Vitamins-Minerals (MULTIVITAMIN WITH MINERALS) tablet Take 1 tablet by mouth daily. Centrum men  50+     Omega-3 1000 MG CAPS Take 1,000 mg by mouth daily.     OVER THE COUNTER MEDICATION Take 1 tablet by mouth daily. Beets Chew     pantoprazole (PROTONIX) 40 MG tablet TAKE 1 TABLET BY MOUTH EVERY DAY 90 tablet 1   torsemide (DEMADEX) 20 MG tablet TAKE 1 TABLET BY MOUTH TWICE A DAY 180 tablet 1   triamcinolone cream (KENALOG) 0.1 % Apply 1 Application topically 2 (two) times daily as needed (itch).     valACYclovir (VALTREX) 1000 MG tablet Take 1,000 mg by mouth at bedtime.     glimepiride (AMARYL) 2 MG tablet Take 2 mg by mouth daily in the afternoon.     Insulin Glargine (BASAGLAR KWIKPEN) 100 UNIT/ML Inject 38 Units into the skin at bedtime.     KLOR-CON M20 20 MEQ tablet TAKE 1 TABLET BY MOUTH EVERY DAY 90 tablet 1   lisinopril (ZESTRIL) 20 MG tablet TAKE 1 TABLET BY MOUTH EVERY DAY (Patient taking differently: Take 20 mg by mouth in the morning and at bedtime.) 90 tablet 1   0.9 %  sodium chloride infusion      0.9 %  sodium chloride infusion      No facility-administered medications prior to visit.    Allergies  Allergen Reactions   Azithromycin Nausea Only   Tape Rash and Other (See Comments)    Causes skin redness, Use paper  tape only.    ROS Review of Systems  Constitutional:  Positive for fatigue. Negative for chills and fever.  HENT:  Positive for congestion. Negative for sore throat.   Eyes:  Negative for pain and discharge.  Respiratory:  Negative for cough and shortness of breath.   Cardiovascular:  Negative for chest pain and palpitations.  Gastrointestinal:  Negative for diarrhea, nausea and vomiting.  Endocrine: Negative for polydipsia and polyuria.  Genitourinary:  Negative for dysuria and hematuria.  Musculoskeletal:  Negative for neck pain and neck stiffness.  Skin:  Negative for rash.  Neurological:  Negative for dizziness and weakness.  Psychiatric/Behavioral:  Negative for agitation and behavioral problems.       Objective:    Physical  Exam Vitals reviewed.  Constitutional:      General: He is not in acute distress.    Appearance: He is obese. He is not diaphoretic.  HENT:     Head: Normocephalic and atraumatic.     Nose: Nose normal.     Mouth/Throat:     Mouth: Mucous membranes are moist.  Eyes:     General: No scleral icterus.    Extraocular Movements: Extraocular movements intact.  Cardiovascular:     Rate and Rhythm: Normal rate and regular rhythm.     Heart sounds: Normal heart sounds. No murmur heard. Pulmonary:     Breath sounds: Normal breath sounds. No wheezing or rales.  Abdominal:     Palpations: Abdomen is soft.     Tenderness: There is abdominal tenderness (Mild, epigastric).  Musculoskeletal:     Cervical back: Neck supple. No tenderness.     Right lower leg: Edema (1+) present.     Left lower leg: Edema (1+) present.  Skin:    General: Skin is warm.     Findings: No rash.  Neurological:     General: No focal deficit present.     Mental Status: He is alert and oriented to person, place, and time.     Cranial Nerves: No cranial nerve deficit.     Sensory: No sensory deficit.     Motor: No weakness.  Psychiatric:        Mood and Affect: Mood normal.        Behavior: Behavior normal.     BP (!) 148/60 (BP Location: Right Arm)   Pulse 62   Ht 5\' 10"  (1.778 m)   Wt 246 lb 6.4 oz (111.8 kg)   SpO2 91%   BMI 35.35 kg/m  Wt Readings from Last 3 Encounters:  08/08/23 246 lb 6.4 oz (111.8 kg)  07/18/23 245 lb 9.6 oz (111.4 kg)  05/28/23 242 lb 9.6 oz (110 kg)    Lab Results  Component Value Date   TSH 3.037 05/21/2022   Lab Results  Component Value Date   WBC 7.5 08/06/2023   HGB 11.2 (L) 08/06/2023   HCT 36.7 (L) 08/06/2023   MCV 94.3 08/06/2023   PLT 304 08/06/2023   Lab Results  Component Value Date   NA 140 07/22/2023   K 5.4 (H) 07/22/2023   CO2 27 07/22/2023   GLUCOSE 188 (H) 07/22/2023   BUN 27 07/22/2023   CREATININE 1.22 07/22/2023   BILITOT 0.4 07/22/2023    ALKPHOS 73 07/22/2023   AST 16 07/22/2023   ALT 14 07/22/2023   PROT 6.7 07/22/2023   ALBUMIN 4.1 07/22/2023   CALCIUM 9.3 07/22/2023   ANIONGAP 14 06/28/2022   EGFR 60 07/22/2023   GFR 76.23  05/04/2015   Lab Results  Component Value Date   CHOL 147 07/22/2023   Lab Results  Component Value Date   HDL 24 (L) 07/22/2023   Lab Results  Component Value Date   LDLCALC 87 07/22/2023   Lab Results  Component Value Date   TRIG 213 (H) 07/22/2023   Lab Results  Component Value Date   CHOLHDL 6.1 (H) 07/22/2023   Lab Results  Component Value Date   HGBA1C 9.9 (H) 07/22/2023      Assessment & Plan:   Problem List Items Addressed This Visit       Cardiovascular and Mediastinum   Essential hypertension    BP Readings from Last 1 Encounters:  08/08/23 (!) 148/60   Uncontrolled with lisinopril 20 mg QD/BID, Coreg 3.125 mg BID and Torsemide 20 mg BID Increase dose of lisinopril to 40 mg QD If persistently elevated BP, can add Hydralazine Counseled for compliance with the medications Advised DASH diet and moderate exercise/walking, at least 150 mins/week      Relevant Medications   lisinopril (ZESTRIL) 40 MG tablet   Paroxysmal atrial fibrillation (HCC)    Currently in sinus rhythm On Amiodarone 200 mg QD Rate controlled with Coreg 3.125 mg BID Has watchman device in place, had GI bleeding due to Eliquis      Relevant Medications   lisinopril (ZESTRIL) 40 MG tablet     Digestive   Chronic gastric erosion    Noted in EGD On pantoprazole 40 mg QD Considering recent drop in Hb and epigastric discomfort, advised to take pantoprazole twice daily for 2 weeks Contact GI clinic if persistent symptoms        Endocrine   Type 2 diabetes mellitus with other specified complication (HCC)    Lab Results  Component Value Date   HGBA1C 9.9 (H) 07/22/2023   Uncontrolled Associated with diabetic retinopathy, HTN, CAD and HLD On Basaglar 45 U at bedtime, Metformin 1000  mg BID and Glimepiride 2 mg once daily Recently increased dose of Basaglar to 45 units from 38 U at bedtime Increased dose of glimepiride to 2 mg BID today Does not want to start GLP1 agonist Advised to follow diabetic diet On ACEi F/u CMP and lipid panel Diabetic eye exam: Advised to follow up with Ophthalmology for diabetic eye exam      Relevant Medications   Insulin Glargine (BASAGLAR KWIKPEN) 100 UNIT/ML   glimepiride (AMARYL) 2 MG tablet   lisinopril (ZESTRIL) 40 MG tablet     Other   Obesity, Class III, BMI 40-49.9 (morbid obesity) (HCC) (Chronic)    BMI Readings from Last 3 Encounters:  08/08/23 35.35 kg/m  07/18/23 35.24 kg/m  05/28/23 34.81 kg/m   Associated with HTN, DM, OSA Advised to follow low carb diet      Relevant Medications   Insulin Glargine (BASAGLAR KWIKPEN) 100 UNIT/ML   glimepiride (AMARYL) 2 MG tablet   Secondary hypercoagulable state (HCC)    Due to A fib Had GI bleeding from Eliquis Has Watchman device now      Anemia due to chronic blood loss    Due to Eliquis in the past Has had iron transfusions Continue oral iron supplement Followed by Hematology - may need iron transfusion again      Encounter for general adult medical examination with abnormal findings - Primary    Physical exam as documented. Fasting blood tests reviewed today.       Meds ordered this encounter  Medications   Insulin  Glargine (BASAGLAR KWIKPEN) 100 UNIT/ML    Sig: Inject 45 Units into the skin at bedtime.    Dispense:  15 mL    Refill:  3   glimepiride (AMARYL) 2 MG tablet    Sig: Take 1 tablet (2 mg total) by mouth 2 (two) times daily with a meal.    Dispense:  60 tablet    Refill:  3   lisinopril (ZESTRIL) 40 MG tablet    Sig: Take 1 tablet (40 mg total) by mouth daily.    Dispense:  90 tablet    Refill:  1    Follow-up: Return in about 3 months (around 11/08/2023) for HTN and DM.    Anabel Halon, MD

## 2023-08-08 NOTE — Assessment & Plan Note (Signed)
Due to A fib Had GI bleeding from Eliquis Has Watchman device now

## 2023-08-08 NOTE — Assessment & Plan Note (Addendum)
Lab Results  Component Value Date   HGBA1C 9.9 (H) 07/22/2023   Uncontrolled Associated with diabetic retinopathy, HTN, CAD and HLD On Basaglar 45 U at bedtime, Metformin 1000 mg BID and Glimepiride 2 mg once daily Recently increased dose of Basaglar to 45 units from 38 U at bedtime Increased dose of glimepiride to 2 mg BID today Does not want to start GLP1 agonist Advised to follow diabetic diet On ACEi F/u CMP and lipid panel Diabetic eye exam: Advised to follow up with Ophthalmology for diabetic eye exam

## 2023-08-08 NOTE — Assessment & Plan Note (Addendum)
BP Readings from Last 1 Encounters:  08/08/23 (!) 148/60   Uncontrolled with lisinopril 20 mg QD/BID, Coreg 3.125 mg BID and Torsemide 20 mg BID Increase dose of lisinopril to 40 mg QD If persistently elevated BP, can add Hydralazine Counseled for compliance with the medications Advised DASH diet and moderate exercise/walking, at least 150 mins/week

## 2023-08-08 NOTE — Patient Instructions (Addendum)
Please start taking Lisinopril 40 mg once daily instead of 20 mg.  Please start taking Pantoprazole twice daily for acid reflux.  Please continue taking Basaglar 45 Units at bedtime. Start taking Glimepiride 2 mg twice daily before meals. Continue taking Metformin 2 tablets twice daily.  Please stop taking Potassium supplement.  Please continue to take medications as prescribed.  Please continue to follow low carb diet and perform moderate exercise/walking as tolerated.

## 2023-08-10 ENCOUNTER — Other Ambulatory Visit: Payer: Self-pay | Admitting: Internal Medicine

## 2023-08-10 DIAGNOSIS — J309 Allergic rhinitis, unspecified: Secondary | ICD-10-CM

## 2023-08-13 ENCOUNTER — Inpatient Hospital Stay: Payer: Medicare Other | Admitting: Physician Assistant

## 2023-08-13 VITALS — BP 157/69 | HR 57 | Temp 98.1°F | Resp 18 | Ht 70.0 in | Wt 248.9 lb

## 2023-08-13 DIAGNOSIS — D500 Iron deficiency anemia secondary to blood loss (chronic): Secondary | ICD-10-CM

## 2023-08-13 DIAGNOSIS — E538 Deficiency of other specified B group vitamins: Secondary | ICD-10-CM

## 2023-08-13 DIAGNOSIS — D509 Iron deficiency anemia, unspecified: Secondary | ICD-10-CM | POA: Diagnosis not present

## 2023-08-13 NOTE — Patient Instructions (Addendum)
[facility] **VISIT SUMMARY & IMPORTANT INSTRUCTIONS **   You were seen today by Rojelio Brenner PA-C for your anemia.    IRON DEFICIENCY ANEMIA This is most likely caused by difficult to find blood loss in your stomach and intestines.  Continue to follow-up with gastroenterology and seek immediate medical attention if you notice any signs of major blood loss (bright red blood in the toilet or black/tarry bowel movements). Continue to take iron tablet once daily with vitamin C.   We will schedule you for 2 doses of IV iron.  B12 DEFICIENCY Continue taking vitamin B12 over-the-counter supplement 1000 mcg daily.  LABS: Return in 4 months for repeat labs   FOLLOW-UP APPOINTMENT: 4 months  ** Thank you for trusting me with your healthcare!  I strive to provide all of my patients with quality care at each visit.  If you receive a survey for this visit, I would be so grateful to you for taking the time to provide feedback.  Thank you in advance!  ~ Katiejo Gilroy                   Dr. Doreatha Massed   &   Rojelio Brenner, PA-C   - - - - - - - - - - - - - - - - - -    Thank you for choosing  [facility] Center at [facility] to provide your oncology and hematology care.  To afford each patient quality time with our provider, please arrive at least 15 minutes before your scheduled appointment time.   If you have a lab appointment with the Cancer Center please come in thru the Main Entrance and check in at the main information desk.  You need to re-schedule your appointment should you arrive 10 or more minutes late.  We strive to give you quality time with our providers, and arriving late affects you and other patients whose appointments are after yours.  Also, if you no show three or more times for appointments you may be dismissed from the clinic at the providers discretion.     Again, thank you for choosing [facility].  Our  hope is that these requests will decrease the amount of time that you wait before being seen by our physicians.       _____________________________________________________________  Should you have questions after your visit to [facility], please contact our office at [phone] and follow the prompts.  Our office hours are 8:00 a.m. and 4:30 p.m. Monday - Friday.  Please note that voicemails left after 4:00 p.m. may not be returned until the following business day.  We are closed weekends and major holidays.  You do have access to a nurse 24-7, just call the main number to the clinic [phone] and do not press any options, hold on the line and a nurse will answer the phone.    For prescription refill requests, have your pharmacy contact our office and allow 72 hours.     Should you have questions after your visit to [facility], please contact our office at [phone] and follow the prompts.  Our office hours are 8:00 a.m. and 4:30 p.m. Monday - Friday.  Please note that voicemails left after 4:00 p.m. may not be returned until the following business day.  We are closed weekends and major holidays.  You do have access to a nurse 24-7, just call  the main number to the clinic [phone] and do not press any options, hold on the line and a nurse will answer the phone.    For prescription refill requests, have your pharmacy contact our office and allow 72 hours.

## 2023-08-13 NOTE — Progress Notes (Signed)
[facility] [address], [place] [address]   CLINIC:  Medical Oncology/Hematology  PCP:  Anabel Halon, MD [address] [place] [address] [phone]   REASON FOR VISIT:  Follow-up for normocytic anemia  PRIOR THERAPY: History of PRBC transfusions (2023)  CURRENT THERAPY: Oral iron, B12 supplement, intermittent IV iron  INTERVAL HISTORY:   [name] 82 y.o. male returns for routine follow-up of normocytic anemia.  He was last seen by Dr. Ellin Saba on 04/04/2023.  He received Feraheme x 2 in June/July 2024.  At today's visit, he reports feeling fair.  No recent hospitalizations, surgeries, or changes in baseline health status. He reports feeling improved energy after his IV iron in June/July 2024, but is noticing some recurrent fatigue.  He has not noticed any recent rectal bleeding or melena, but does note occasional epistaxis in colder weather (lasting <5 minutes).  He reports ice pica, fatigue, and dyspnea on exertion.  He denies any restless legs, headaches, chest pain, lightheadedness, or syncope.  He is taking daily iron tablet and B12 supplement.  He has 40% energy and 80% appetite. He endorses that he is maintaining a stable weight.   ASSESSMENT & PLAN:  1.  Normocytic anemia (iron deficiency & B12 deficiency): - Patient seen at the request of Doylene Bode, NP -Hospitalized in August 2023 with Hgb 7.6, received 2 units PRBC.  He underwent EGD and colonoscopy which did not show any evidence of bleeding.  Subsequent enteroscopy again did not show a clear source of bleeding. 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  Small bowel nodule - junction distal duodenum/jejunum.  Irregular with a submucosal component, suspicious in appearance.  Pathology was benign. - Colonoscopy (05/24/2022): 5 mm polyp in the sigmoid colon, diverticulosis in the descending colon and at the splenic flexure.   Pathology - hyperplastic polyp. - EGD (05/24/2022): Normal esophagus, small hiatal hernia, friable gastric mucosa, couple of tiny gastric erosions, normal duodenal bulb and second part of duodenum. - He was on Eliquis for about 4 years until September 2023 when he had Watchman procedure done.  Since then he is on aspirin daily (after completing initial course of Plavix) - Most recent IV iron with Feraheme x 2 in June/July 2024 - He is taking iron tablet and vitamin supplement daily  - Hematology workup (09/03/2022): Hemoccult stool positive x 2 Normal reticulocytes, haptoglobin, DAT/Coombs. Persistent but improved iron deficiency with ferritin 33, iron saturation 17% Vitamin B12 deficiency with low B12 162, normal MMA.  Normal copper. SPEP negative.  Minimal elevations in free light chains with kappa 41.1, normal lambda 21.3, and mildly elevated ratio 1.93.  Immunofixation normal. Labs from 08/01/2022 show evidence of mild CKD stage IIIa (creatinine 1.32/GFR 55) - Most recent labs (08/06/2023): Hgb 11.2/MCV 94.3.  Ferritin 19, iron saturation 7%. - Denies any recent rectal bleeding or melena. - Currently reports feeling fatigued, which previously improved after IV iron - DIFFERENTIAL DIAGNOSIS: Suspect iron deficiency anemia in the setting of occult GI bleeding as well as vitamin B12 deficiency.  May also have some small aspect of anemia related to his CKD. - PLAN: Recommend IV Feraheme x 2. - Continue daily iron supplement + vitamin B12 1000 mcg daily - Labs in 4 months (CBC/D, CMP, ferritin, iron/TIBC, B12, MMA)  - RTC 4 months for office visit   2.  Social/family history: - Lives at home by himself.  He is independent of ADLs and IADLs.  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  He has exposure to torrent chemicals.  He smoked 1 pack/day for 35 years and quit smoking in 1992. - No family history of significant anemia. - Mother had mesothelioma.  Maternal aunt had  colon cancer.  Maternal uncle had adrenal gland cancer.  PLAN SUMMARY: >> IV Feraheme x 2 >> Labs in 4 months = CBC/D, CMP, ferritin, iron/TIBC, B12, MMA >> OFFICE visit in 4 months (1 week after labs)     REVIEW OF SYSTEMS:   Review of Systems  Constitutional:  Positive for fatigue. Negative for appetite change, chills, diaphoresis, fever and unexpected weight change.  HENT:   Negative for lump/mass and nosebleeds.   Eyes:  Negative for eye problems.  Respiratory:  Positive for shortness of breath (with exertion). Negative for cough and hemoptysis.   Cardiovascular:  Negative for chest pain, leg swelling and palpitations.  Gastrointestinal:  Positive for constipation and diarrhea. Negative for abdominal pain, blood in stool, nausea and vomiting.  Genitourinary:  Negative for hematuria.   Skin: Negative.   Neurological:  Positive for dizziness and numbness. Negative for headaches and light-headedness.  Hematological:  Does not bruise/bleed easily.     PHYSICAL EXAM:  ECOG PERFORMANCE STATUS: 1 - Symptomatic but completely ambulatory  Vitals:   08/13/23 1430  BP: (!) 157/69  Pulse: (!) 57  Resp: 18  Temp: 98.1 F (36.7 C)  SpO2: 98%   Filed Weights   08/13/23 1430  Weight: 248 lb 14.4 oz (112.9 kg)   Physical Exam Constitutional:      Appearance: Normal appearance. He is obese.  Cardiovascular:     Heart sounds: Normal heart sounds.  Pulmonary:     Breath sounds: Normal breath sounds.  Neurological:     General: No focal deficit present.     Mental Status: Mental status is at baseline.  Psychiatric:        Behavior: Behavior normal. Behavior is cooperative.     PAST MEDICAL/SURGICAL HISTORY:  Past Medical History:  Diagnosis Date   Arthritis    Atrial fibrillation (HCC)    CAD (coronary artery disease)    a. Cath 03/17/15 showing 100% ostial D1, 50% prox LAD to mid LAD, 40% RPDA stenosis. Med rx. // Myoview 01/2020: EF 31 diffuse perfusion defect without  reversibility (suspect artifact); reviewed with Dr. Tama High study felt to be low risk   Cataract    Mixed form OD   Chronic diastolic CHF (congestive heart failure) (HCC)    Diabetic retinopathy (HCC)    NPDR OU   Essential hypertension    Glaucoma    POAG OU   History of gout    Hyperlipidemia    Hypertensive retinopathy    OU   Kidney stones    Melanoma of neck (HCC)    NICM (nonischemic cardiomyopathy) (HCC)    OSA on CPAP 2012   Prostate cancer (HCC)    Type II diabetes mellitus (HCC)    Past Surgical History:  Procedure Laterality Date   ABDOMINAL HERNIA REPAIR     w/mesh   ATRIAL FIBRILLATION ABLATION N/A 06/06/2021   Procedure: ATRIAL FIBRILLATION ABLATION;  Surgeon: Hillis Range, MD;  Location: [facility] INVASIVE CV LAB;  Service: Cardiovascular;  Laterality: N/A;   BIOPSY  05/24/2022   Procedure: BIOPSY;  Surgeon: Corbin Ade, MD;  [facility]: [facility];  Service: Endoscopy;;   BIOPSY  05/30/2022   Procedure: BIOPSY;  Surgeon: Eula Listen  M, MD;  Location: [facility];  Service: Endoscopy;;   BIOPSY  01/10/2023   Procedure: BIOPSY;  Surgeon: Meridee Score Netty Starring., MD;  Location: [facility]OS[facility];  Service: Gastroenterology;;   CARDIAC CATHETERIZATION N/A 03/17/2015   Procedure: Left Heart Cath and Coronary Angiography;  Surgeon: Runell Gess, MD;  Location: [facility] INVASIVE CV LAB;  Service: Cardiovascular;  Laterality: N/A;   CARDIOVERSION N/A 08/09/2021   Procedure: CARDIOVERSION;  Surgeon: Chilton Si, MD;  Location: [facility] ENDOSCOPY;  Service: Cardiovascular;  Laterality: N/A;   CARDIOVERSION N/A 11/10/2021   Procedure: CARDIOVERSION;  Surgeon: Thomasene Ripple, DO;  Location: [facility] ENDOSCOPY;  Service: Cardiovascular;  Laterality: N/A;   carotid doppler  09/17/2008   rigt and left ICAs 0-49%;mildly  abnormal   CATARACT EXTRACTION Left 2020   Dr. Alben Spittle   COLONOSCOPY N/A 05/16/2017   Procedure: COLONOSCOPY;  Surgeon: Malissa Hippo, MD;  [facility]: [facility];   Service: Endoscopy;  Laterality: N/A;  930   COLONOSCOPY WITH PROPOFOL N/A 05/24/2022   Procedure: COLONOSCOPY WITH PROPOFOL;  Surgeon: Corbin Ade, MD;  [facility]: [facility];  Service: Endoscopy;  Laterality: N/A;   DOPPLER ECHOCARDIOGRAPHY  05/25/2009   EF 50-55%,LA mildly dilated, LV function normal   ELECTROPHYSIOLOGIC STUDY N/A 04/05/2015   Procedure: Cardioversion;  Surgeon: Thurmon Fair, MD;  Location: [facility] INVASIVE CV LAB;  Service: Cardiovascular;  Laterality: N/A;   ELECTROPHYSIOLOGIC STUDY N/A 09/06/2015   Procedure: Atrial Fibrillation Ablation;  Surgeon: Hillis Range, MD;  Location: [facility] INVASIVE CV LAB;  Service: Cardiovascular;  Laterality: N/A;   ELECTROPHYSIOLOGIC STUDY N/A 07/12/2016   redo afib ablation by Dr Johney Frame   ENTEROSCOPY N/A 05/30/2022   Procedure: ENTEROSCOPY;  Surgeon: Corbin Ade, MD;  [facility];  Service: Endoscopy;  Laterality: N/A;   ESOPHAGOGASTRODUODENOSCOPY (EGD) WITH PROPOFOL N/A 05/24/2022   Procedure: ESOPHAGOGASTRODUODENOSCOPY (EGD) WITH PROPOFOL;  Surgeon: Corbin Ade, MD;  [facility];  Service: Endoscopy;  Laterality: N/A;   ESOPHAGOGASTRODUODENOSCOPY (EGD) WITH PROPOFOL N/A 01/10/2023   Procedure: ESOPHAGOGASTRODUODENOSCOPY (EGD) WITH PROPOFOL;  Surgeon: Meridee Score Netty Starring., MD;  Location: [facility] ENDOSCOPY;  Service: Gastroenterology;  Laterality: N/A;   EUS N/A 01/10/2023   Procedure: UPPER ENDOSCOPIC ULTRASOUND (EUS) RADIAL;  Surgeon: Lemar Lofty., MD;  Location: [facility] ENDOSCOPY;  Service: Gastroenterology;  Laterality: N/A;   EXCISIONAL HEMORRHOIDECTOMY     "inside and out"   EYE SURGERY Left 2020   Cat Sx - Dr. Alben Spittle   FINE NEEDLE ASPIRATION Right    knee; "drew ~ 1 quart off"   GIVENS CAPSULE STUDY N/A 05/25/2022   Procedure: GIVENS CAPSULE STUDY;  Surgeon: Lanelle Bal, DO;  [facility]: [facility];  Service: Endoscopy;  Laterality: N/A;   GIVENS CAPSULE STUDY N/A 01/23/2023   Procedure: GIVENS  CAPSULE STUDY;  Surgeon: Dolores Frame, MD;  [facility]: [facility];  Service: Gastroenterology;  Laterality: N/A;  8:30am   HERNIA REPAIR     HOT HEMOSTASIS N/A 01/10/2023   Procedure: HOT HEMOSTASIS (ARGON PLASMA COAGULATION/BICAP);  Surgeon: Lemar Lofty., MD;  Location: [facility] ENDOSCOPY;  Service: Gastroenterology;  Laterality: N/A;   LAPAROSCOPIC CHOLECYSTECTOMY     LEFT ATRIAL APPENDAGE OCCLUSION N/A 06/28/2022   Procedure: LEFT ATRIAL APPENDAGE OCCLUSION;  Surgeon: Tonny Bollman, MD;  Location: [facility] INVASIVE CV LAB;  Service: Cardiovascular;  Laterality: N/A;   MELANOMA EXCISION Right    "neck"   NM MYOCAR PERF WALL MOTION  02/21/2012   EF 61% ,EXERCISE 7  METS. exercise stopped due to wheezing and shortness of breathe   POLYPECTOMY  05/16/2017   Procedure: POLYPECTOMY;  Surgeon: Malissa Hippo, MD;  [facility];  Service: Endoscopy;;  colon   POLYPECTOMY  05/24/2022   Procedure: POLYPECTOMY;  Surgeon: Corbin Ade, MD;  [facility];  Service: Endoscopy;;   POLYPECTOMY  01/10/2023   Procedure: POLYPECTOMY;  Surgeon: Meridee Score Netty Starring., MD;  Location: [facility] ENDOSCOPY;  Service: Gastroenterology;;   PROSTATECTOMY     SHOULDER OPEN ROTATOR CUFF REPAIR Right X 2   SUBMUCOSAL TATTOO INJECTION  05/30/2022   Procedure: SUBMUCOSAL TATTOO INJECTION;  Surgeon: Corbin Ade, MD;  [facility]: [facility];  Service: Endoscopy;;   SUBMUCOSAL TATTOO INJECTION  01/10/2023   Procedure: SUBMUCOSAL TATTOO INJECTION;  Surgeon: Lemar Lofty., MD;  Location: [facility] ENDOSCOPY;  Service: Gastroenterology;;   TEE WITHOUT CARDIOVERSION N/A 09/05/2015   Procedure: TRANSESOPHAGEAL ECHOCARDIOGRAM (TEE);  Surgeon: Quintella Reichert, MD;  Location: [facility] ENDOSCOPY;  Service: Cardiovascular;  Laterality: N/A;   TEE WITHOUT CARDIOVERSION N/A 06/28/2022   Procedure: TRANSESOPHAGEAL ECHOCARDIOGRAM (TEE);  Surgeon: Tonny Bollman, MD;  Location: [facility] INVASIVE CV LAB;  Service:  Cardiovascular;  Laterality: N/A;    SOCIAL HISTORY:  Social History   Socioeconomic History   Marital status: Widowed    Spouse name: Not on file   Number of children: 1   Years of education: Not on file   Highest education level: Not on file  Occupational History   Occupation: Retired  Tobacco Use   Smoking status: Former    Current packs/day: 0.00    Average packs/day: 2.0 packs/day for 27.0 years (54.0 ttl pk-yrs)    Types: Cigarettes    Start date: 73    Quit date: 1992    Years since quitting: 32.8    Passive exposure: Current   Smokeless tobacco: Never   Tobacco comments:    Former smoker 09/27/21  Vaping Use   Vaping status: Never Used  Substance and Sexual Activity   Alcohol use: No    Alcohol/week: 0.0 standard drinks of alcohol    Comment: "used to drink; stopped ~ 2008"   Drug use: No   Sexual activity: Not Currently  Other Topics Concern   Not on file  Social History Narrative   Lives in [place], [place] with wife.   Social Determinants of Health   Financial Resource Strain: Not on file  Food Insecurity: No Food Insecurity (09/11/2022)   Hunger Vital Sign    Worried About Running Out of Food in the Last Year: Never true    Ran Out of Food in the Last Year: Never true  Transportation Needs: No Transportation Needs (09/11/2022)   PRAPARE - Administrator, Civil Service (Medical): No    Lack of Transportation (Non-Medical): No  Physical Activity: Not on file  Stress: Not on file  Social Connections: Not on file  Intimate Partner Violence: Not At Risk (09/11/2022)   Humiliation, Afraid, Rape, and Kick questionnaire    Fear of Current or Ex-Partner: No    Emotionally Abused: No    Physically Abused: No    Sexually Abused: No    FAMILY HISTORY:  Family History  Problem Relation Age of Onset   Heart disease Mother    Lung cancer Mother    Heart attack Mother 55   Diabetes Father    Heart disease Father    Stroke Brother    Healthy  Daughter  Colon cancer Maternal Aunt        37s    CURRENT MEDICATIONS:  Outpatient Encounter Medications as of 08/13/2023  Medication Sig   acetaminophen (TYLENOL) 650 MG CR tablet Take 1,300 mg by mouth every 8 (eight) hours as needed for pain.   amiodarone (PACERONE) 200 MG tablet TAKE 1 TABLET BY MOUTH EVERY DAY   Artificial Tear Ointment (DRY EYES OP) Place 1 drop into both eyes daily as needed (Dry eye).   Ascorbic Acid (VITAMIN C) 100 MG tablet Take 100 mg by mouth daily.   aspirin 81 MG chewable tablet Chew 81 mg by mouth daily.   BERBERINE CHLORIDE PO Take by mouth daily.   camphor-menthol (ANTI-ITCH) lotion Apply 1 Application topically daily as needed for itching.   carvedilol (COREG) 3.125 MG tablet TAKE 1 TABLET BY MOUTH 2 TIMES DAILY.   COMBIGAN 0.2-0.5 % ophthalmic solution Place 1 drop into both eyes 2 (two) times daily.   CRANBERRY PO Take 1 tablet by mouth daily.   cyanocobalamin (VITAMIN B12) 1000 MCG tablet Take 1 tablet (1,000 mcg total) by mouth daily.   ELDERBERRY PO Take 2 tablets by mouth daily. Chewable   fluticasone (FLONASE) 50 MCG/ACT nasal spray SPRAY 2 SPRAYS INTO EACH NOSTRIL EVERY DAY   gemfibrozil (LOPID) 600 MG tablet Take 600 mg by mouth at bedtime.    glimepiride (AMARYL) 2 MG tablet Take 1 tablet (2 mg total) by mouth 2 (two) times daily with a meal.   Insulin Glargine (BASAGLAR KWIKPEN) 100 UNIT/ML Inject 45 Units into the skin at bedtime.   lisinopril (ZESTRIL) 40 MG tablet Take 1 tablet (40 mg total) by mouth daily.   magnesium gluconate (MAGONATE) 500 MG tablet Take 500 mg by mouth daily.   metFORMIN (GLUCOPHAGE-XR) 500 MG 24 hr tablet Take 1,000 mg by mouth in the morning and at bedtime.   mineral oil liquid Take 15 mLs by mouth daily as needed for moderate constipation.   Multiple Vitamins-Minerals (MULTIVITAMIN WITH MINERALS) tablet Take 1 tablet by mouth daily. Centrum men 50+   Omega-3 1000 MG CAPS Take 1,000 mg by mouth daily.   OVER THE  COUNTER MEDICATION Take 1 tablet by mouth daily. Beets Chew   pantoprazole (PROTONIX) 40 MG tablet TAKE 1 TABLET BY MOUTH EVERY DAY   torsemide (DEMADEX) 20 MG tablet TAKE 1 TABLET BY MOUTH TWICE A DAY   triamcinolone cream (KENALOG) 0.1 % Apply 1 Application topically 2 (two) times daily as needed (itch).   valACYclovir (VALTREX) 1000 MG tablet Take 1,000 mg by mouth at bedtime.   ferrous sulfate 325 (65 FE) MG tablet Take 1 tablet (325 mg total) by mouth daily with breakfast.   No facility-administered encounter medications on file as of 08/13/2023.    ALLERGIES:  Allergies  Allergen Reactions   Azithromycin Nausea Only   Tape Rash and Other (See Comments)    Causes skin redness, Use paper tape only.    LABORATORY DATA:  I have reviewed the labs as listed.  CBC    Component Value Date/Time   WBC 7.5 08/06/2023 1418   RBC 3.89 (L) 08/06/2023 1418   HGB 11.2 (L) 08/06/2023 1418   HGB 10.5 (L) 08/01/2022 1123   HCT 36.7 (L) 08/06/2023 1418   HCT 32.6 (L) 08/01/2022 1123   PLT 304 08/06/2023 1418   PLT 313 08/01/2022 1123   MCV 94.3 08/06/2023 1418   MCV 93 08/01/2022 1123   MCH 28.8 08/06/2023 1418   MCHC  30.5 08/06/2023 1418   RDW 14.2 08/06/2023 1418   RDW 19.9 (H) 08/01/2022 1123   LYMPHSABS 1.1 04/04/2023 1228   LYMPHSABS 1.2 05/16/2021 1130   MONOABS 0.6 04/04/2023 1228   EOSABS 0.2 04/04/2023 1228   EOSABS 0.1 05/16/2021 1130   BASOSABS 0.0 04/04/2023 1228   BASOSABS 0.0 05/16/2021 1130      Latest Ref Rng & Units 07/22/2023   10:34 AM 08/01/2022   11:23 AM 06/28/2022    8:26 AM  CMP  Glucose 70 - 99 mg/dL 161  096  045   BUN 8 - 27 mg/dL 27  22  23    Creatinine 0.76 - 1.27 mg/dL 4.09  8.11  9.14   Sodium 134 - 144 mmol/L 140  142  140   Potassium 3.5 - 5.2 mmol/L 5.4  5.2  3.8   Chloride 96 - 106 mmol/L 96  98  100   CO2 20 - 29 mmol/L 27  32  26   Calcium 8.6 - 10.2 mg/dL 9.3  9.4  9.3   Total Protein 6.0 - 8.5 g/dL 6.7  6.9    Total Bilirubin 0.0 -  1.2 mg/dL 0.4  <7.8    Alkaline Phos 44 - 121 IU/L 73  75    AST 0 - 40 IU/L 16  15    ALT 0 - 44 IU/L 14  11      DIAGNOSTIC IMAGING:  I have independently reviewed the relevant imaging and discussed with the patient.   WRAP UP:  All questions were answered. The patient knows to call the clinic with any problems, questions or concerns.  Medical decision making: Moderate  Time spent on visit: I spent 20 minutes counseling the patient face to face. The total time spent in the appointment was 30 minutes and more than 50% was on counseling.  Carnella Guadalajara, PA-C  08/13/23 3:21 PM

## 2023-08-15 NOTE — Progress Notes (Signed)
[facility] - Clinic Note  08/16/2023    CHIEF COMPLAINT Patient presents for Retina Follow Up  HISTORY OF PRESENT ILLNESS: Lucas Dudley is a 82 y.o. male who presents to the clinic today for:  HPI     Retina Follow Up   Patient presents with  Diabetic Retinopathy.  In both eyes.  This started 5 weeks ago.  Duration of 5 weeks.  Since onset it is stable.  I, the attending physician,  performed the HPI with the patient and updated documentation appropriately.        Comments   5 week retina follow up NPDR OU and IVV OD pt is reporting no vision changes noticed and denies any flashes his last reading was 123 A1C 9.3      Last edited by Rennis Chris, MD on 08/16/2023  4:59 PM.    Pt states he is not having any problems with his vision, he is not using any drops for dry eyes right now   Referring physician: Anabel Halon, MD [address] [place],  [place] [address]  HISTORICAL INFORMATION:   Selected notes from the MEDICAL RECORD NUMBER Referred by Dr. Alben Spittle for DEE   CURRENT MEDICATIONS: Current Outpatient Medications (Ophthalmic Drugs)  Medication Sig   Artificial Tear Ointment (DRY EYES OP) Place 1 drop into both eyes daily as needed (Dry eye).   COMBIGAN 0.2-0.5 % ophthalmic solution Place 1 drop into both eyes 2 (two) times daily.   No current facility-administered medications for this visit. (Ophthalmic Drugs)   Current Outpatient Medications (Other)  Medication Sig   acetaminophen (TYLENOL) 650 MG CR tablet Take 1,300 mg by mouth every 8 (eight) hours as needed for pain.   amiodarone (PACERONE) 200 MG tablet TAKE 1 TABLET BY MOUTH EVERY DAY   Ascorbic Acid (VITAMIN C) 100 MG tablet Take 100 mg by mouth daily.   aspirin 81 MG chewable tablet Chew 81 mg by mouth daily.   BERBERINE CHLORIDE PO Take by mouth daily.   camphor-menthol (ANTI-ITCH) lotion Apply 1 Application topically daily as needed for itching.   carvedilol (COREG) 3.125 MG  tablet TAKE 1 TABLET BY MOUTH 2 TIMES DAILY.   CRANBERRY PO Take 1 tablet by mouth daily.   cyanocobalamin (VITAMIN B12) 1000 MCG tablet Take 1 tablet (1,000 mcg total) by mouth daily.   ELDERBERRY PO Take 2 tablets by mouth daily. Chewable   ferrous sulfate 325 (65 FE) MG tablet Take 1 tablet (325 mg total) by mouth daily with breakfast.   fluticasone (FLONASE) 50 MCG/ACT nasal spray SPRAY 2 SPRAYS INTO EACH NOSTRIL EVERY DAY   gemfibrozil (LOPID) 600 MG tablet Take 600 mg by mouth at bedtime.    glimepiride (AMARYL) 2 MG tablet Take 1 tablet (2 mg total) by mouth 2 (two) times daily with a meal.   Insulin Glargine (BASAGLAR KWIKPEN) 100 UNIT/ML Inject 45 Units into the skin at bedtime.   lisinopril (ZESTRIL) 40 MG tablet Take 1 tablet (40 mg total) by mouth daily.   magnesium gluconate (MAGONATE) 500 MG tablet Take 500 mg by mouth daily.   metFORMIN (GLUCOPHAGE-XR) 500 MG 24 hr tablet Take 1,000 mg by mouth in the morning and at bedtime.   mineral oil liquid Take 15 mLs by mouth daily as needed for moderate constipation.   Multiple Vitamins-Minerals (MULTIVITAMIN WITH MINERALS) tablet Take 1 tablet by mouth daily. Centrum men 50+   Omega-3 1000 MG CAPS Take 1,000 mg by mouth  daily.   OVER THE COUNTER MEDICATION Take 1 tablet by mouth daily. Beets Chew   pantoprazole (PROTONIX) 40 MG tablet TAKE 1 TABLET BY MOUTH EVERY DAY   torsemide (DEMADEX) 20 MG tablet TAKE 1 TABLET BY MOUTH TWICE A DAY   triamcinolone cream (KENALOG) 0.1 % Apply 1 Application topically 2 (two) times daily as needed (itch).   valACYclovir (VALTREX) 1000 MG tablet Take 1,000 mg by mouth at bedtime.   No current facility-administered medications for this visit. (Other)   REVIEW OF SYSTEMS: ROS   Positive for: Gastrointestinal, Endocrine, Cardiovascular, Eyes, Respiratory Negative for: Constitutional, Neurological, Skin, Genitourinary, Musculoskeletal, HENT, Psychiatric, Allergic/Imm, Heme/Lymph Last edited by  Etheleen Mayhew, COT on 08/16/2023  1:42 PM.     ALLERGIES Allergies  Allergen Reactions   Azithromycin Nausea Only   Tape Rash and Other (See Comments)    Causes skin redness, Use paper tape only.   PAST MEDICAL HISTORY Past Medical History:  Diagnosis Date   Arthritis    Atrial fibrillation (HCC)    CAD (coronary artery disease)    a. Cath 03/17/15 showing 100% ostial D1, 50% prox LAD to mid LAD, 40% RPDA stenosis. Med rx. // Myoview 01/2020: EF 31 diffuse perfusion defect without reversibility (suspect artifact); reviewed with Dr. Tama High study felt to be low risk   Cataract    Mixed form OD   Chronic diastolic CHF (congestive heart failure) (HCC)    Diabetic retinopathy (HCC)    NPDR OU   Essential hypertension    Glaucoma    POAG OU   History of gout    Hyperlipidemia    Hypertensive retinopathy    OU   Kidney stones    Melanoma of neck (HCC)    NICM (nonischemic cardiomyopathy) (HCC)    OSA on CPAP 2012   Prostate cancer (HCC)    Type II diabetes mellitus (HCC)    Past Surgical History:  Procedure Laterality Date   ABDOMINAL HERNIA REPAIR     w/mesh   ATRIAL FIBRILLATION ABLATION N/A 06/06/2021   Procedure: ATRIAL FIBRILLATION ABLATION;  Surgeon: Hillis Range, MD;  Location: [facility] INVASIVE CV LAB;  Service: Cardiovascular;  Laterality: N/A;   BIOPSY  05/24/2022   Procedure: BIOPSY;  Surgeon: Corbin Ade, MD;  Location: [facility];  Service: Endoscopy;;   BIOPSY  05/30/2022   Procedure: BIOPSY;  Surgeon: Corbin Ade, MD;  Location: [facility];  Service: Endoscopy;;   BIOPSY  01/10/2023   Procedure: BIOPSY;  Surgeon: Lemar Lofty., MD;  Location: [facility];  Service: Gastroenterology;;   CARDIAC CATHETERIZATION N/A 03/17/2015   Procedure: Left Heart Cath and Coronary Angiography;  Surgeon: Runell Gess, MD;  Location: [facility] INVASIVE CV LAB;  Service: Cardiovascular;  Laterality: N/A;   CARDIOVERSION N/A 08/09/2021   Procedure:  CARDIOVERSION;  Surgeon: Chilton Si, MD;  Location: [facility] ENDOSCOPY;  Service: Cardiovascular;  Laterality: N/A;   CARDIOVERSION N/A 11/10/2021   Procedure: CARDIOVERSION;  Surgeon: Thomasene Ripple, DO;  Location: [facility] ENDOSCOPY;  Service: Cardiovascular;  Laterality: N/A;   carotid doppler  09/17/2008   rigt and left ICAs 0-49%;mildly  abnormal   CATARACT EXTRACTION Left 2020   Dr. Alben Spittle   COLONOSCOPY N/A 05/16/2017   Procedure: COLONOSCOPY;  Surgeon: Malissa Hippo, MD;  Location: [facility];  Service: Endoscopy;  Laterality: N/A;  930   COLONOSCOPY WITH PROPOFOL N/A 05/24/2022   Procedure: COLONOSCOPY WITH PROPOFOL;  Surgeon: Corbin Ade, MD;  Location: [facility];  Service: Endoscopy;  Laterality: N/A;   DOPPLER ECHOCARDIOGRAPHY  05/25/2009   EF 50-55%,LA mildly dilated, LV function normal   ELECTROPHYSIOLOGIC STUDY N/A 04/05/2015   Procedure: Cardioversion;  Surgeon: Thurmon Fair, MD;  Location: [facility] CV LAB;  Service: Cardiovascular;  Laterality: N/A;   ELECTROPHYSIOLOGIC STUDY N/A 09/06/2015   Procedure: Atrial Fibrillation Ablation;  Surgeon: Hillis Range, MD;  Location: [facility] INVASIVE CV LAB;  Service: Cardiovascular;  Laterality: N/A;   ELECTROPHYSIOLOGIC STUDY N/A 07/12/2016   redo afib ablation by Dr Johney Frame   ENTEROSCOPY N/A 05/30/2022   Procedure: ENTEROSCOPY;  Surgeon: Corbin Ade, MD;  [facility]: [facility];  Service: Endoscopy;  Laterality: N/A;   ESOPHAGOGASTRODUODENOSCOPY (EGD) WITH PROPOFOL N/A 05/24/2022   Procedure: ESOPHAGOGASTRODUODENOSCOPY (EGD) WITH PROPOFOL;  Surgeon: Corbin Ade, MD;  [facility];  Service: Endoscopy;  Laterality: N/A;   ESOPHAGOGASTRODUODENOSCOPY (EGD) WITH PROPOFOL N/A 01/10/2023   Procedure: ESOPHAGOGASTRODUODENOSCOPY (EGD) WITH PROPOFOL;  Surgeon: Meridee Score Netty Starring., MD;  Location: [facility] ENDOSCOPY;  Service: Gastroenterology;  Laterality: N/A;   EUS N/A 01/10/2023   Procedure: UPPER ENDOSCOPIC ULTRASOUND (EUS)  RADIAL;  Surgeon: Lemar Lofty., MD;  Location: [facility] ENDOSCOPY;  Service: Gastroenterology;  Laterality: N/A;   EXCISIONAL HEMORRHOIDECTOMY     "inside and out"   EYE SURGERY Left 2020   Cat Sx - Dr. Alben Spittle   FINE NEEDLE ASPIRATION Right    knee; "drew ~ 1 quart off"   GIVENS CAPSULE STUDY N/A 05/25/2022   Procedure: GIVENS CAPSULE STUDY;  Surgeon: Lanelle Bal, DO;  Location: [facility];  Service: Endoscopy;  Laterality: N/A;   GIVENS CAPSULE STUDY N/A 01/23/2023   Procedure: GIVENS CAPSULE STUDY;  Surgeon: Dolores Frame, MD;  [facility]: [facility];  Service: Gastroenterology;  Laterality: N/A;  8:30am   HERNIA REPAIR     HOT HEMOSTASIS N/A 01/10/2023   Procedure: HOT HEMOSTASIS (ARGON PLASMA COAGULATION/BICAP);  Surgeon: Lemar Lofty., MD;  Location: [facility];  Service: Gastroenterology;  Laterality: N/A;   LAPAROSCOPIC CHOLECYSTECTOMY     LEFT ATRIAL APPENDAGE OCCLUSION N/A 06/28/2022   Procedure: LEFT ATRIAL APPENDAGE OCCLUSION;  Surgeon: Tonny Bollman, MD;  Location: [facility] INVASIVE CV LAB;  Service: Cardiovascular;  Laterality: N/A;   MELANOMA EXCISION Right    "neck"   NM MYOCAR PERF WALL MOTION  02/21/2012   EF 61% ,EXERCISE 7 METS. exercise stopped due to wheezing and shortness of breathe   POLYPECTOMY  05/16/2017   Procedure: POLYPECTOMY;  Surgeon: Malissa Hippo, MD;  [facility];  Service: Endoscopy;;  colon   POLYPECTOMY  05/24/2022   Procedure: POLYPECTOMY;  Surgeon: Corbin Ade, MD;  [facility];  Service: Endoscopy;;   POLYPECTOMY  01/10/2023   Procedure: POLYPECTOMY;  Surgeon: Meridee Score Netty Starring., MD;  Location: [facility];  Service: Gastroenterology;;   PROSTATECTOMY     SHOULDER OPEN ROTATOR CUFF REPAIR Right X 2   SUBMUCOSAL TATTOO INJECTION  05/30/2022   Procedure: SUBMUCOSAL TATTOO INJECTION;  Surgeon: Corbin Ade, MD;  Location: [facility];  Service: Endoscopy;;   SUBMUCOSAL TATTOO  INJECTION  01/10/2023   Procedure: SUBMUCOSAL TATTOO INJECTION;  Surgeon: Lemar Lofty., MD;  Location: [facility];  Service: Gastroenterology;;   TEE WITHOUT CARDIOVERSION N/A 09/05/2015   Procedure: TRANSESOPHAGEAL ECHOCARDIOGRAM (TEE);  Surgeon: Quintella Reichert, MD;  Location: [facility] ENDOSCOPY;  Service: Cardiovascular;  Laterality: N/A;   TEE WITHOUT CARDIOVERSION N/A 06/28/2022   Procedure: TRANSESOPHAGEAL  ECHOCARDIOGRAM (TEE);  Surgeon: Tonny Bollman, MD;  Location: [facility] INVASIVE CV LAB;  Service: Cardiovascular;  Laterality: N/A;   FAMILY HISTORY Family History  Problem Relation Age of Onset   Heart disease Mother    Lung cancer Mother    Heart attack Mother 18   Diabetes Father    Heart disease Father    Stroke Brother    Healthy Daughter    Colon cancer Maternal Aunt        87s   SOCIAL HISTORY Social History   Tobacco Use   Smoking status: Former    Current packs/day: 0.00    Average packs/day: 2.0 packs/day for 27.0 years (54.0 ttl pk-yrs)    Types: Cigarettes    Start date: 36    Quit date: 36    Years since quitting: 32.8    Passive exposure: Current   Smokeless tobacco: Never   Tobacco comments:    Former smoker 09/27/21  Vaping Use   Vaping status: Never Used  Substance Use Topics   Alcohol use: No    Alcohol/week: 0.0 standard drinks of alcohol    Comment: "used to drink; stopped ~ 2008"   Drug use: No       OPHTHALMIC EXAM: Base Eye Exam     Visual Acuity (Snellen - Linear)       Right Left   Dist cc 20/30 -2 20/25   Dist ph cc 20/25 -3 NI    Correction: Glasses         Tonometry (Tonopen, 1:51 PM)       Right Left   Pressure 18 18         Pupils       Pupils Dark Light Shape React APD   Right PERRL 2 1 Round Brisk None   Left PERRL 2 1 Round Brisk None         Visual Fields       Left Right    Full Full         Extraocular Movement       Right Left    Full, Ortho Full, Ortho         Neuro/Psych      Oriented x3: Yes   Mood/Affect: Normal         Dilation     Both eyes: 2.5% Phenylephrine @ 1:51 PM           Slit Lamp and Fundus Exam     Slit Lamp Exam       Right Left   Lids/Lashes Dermato, mild MGD Dermato, mild MGD   Conjunctiva/Sclera Temporal pinguecula, mild inferior sub conj heme Temporal pinguecula   Cornea EBMD, mild haze, trace PEE, mild Debris in tear film, well healed cataract wound trace haze, trace PEE, well healed temporal cataract wounds, arcus   Anterior Chamber Deep and clear; narrow temporal angle Deep and clear   Iris Round and dilated, mild anterior bowing, No NVI Round and moderately dilated to 5.65mm   Lens PCIOL in good position, trace PCO PC IOL in good position with open PC   Anterior Vitreous Synerisis Synerisis         Fundus Exam       Right Left   Disc Mild pallor, sharp rim, +cupping, thin inferior rim Mild pallor, sharp rim, +cupping, +PPA, punctate heme at 0900   C/D Ratio 0.7 0.6   Macula Flat, blunted foveal reflex, cystic changes - slightly improved, RPE mottling and clumping, no heme,  Drusen, punctate Exudates -- resolved Flat, good foveal reflex, no heme, cystic changes -- stably improved   Vessels attenuated, Tortuous attenuated, Tortuous   Periphery Attached, rare MA, forcal DBH nasal to disc--improved Attached. No heme.           IMAGING AND PROCEDURES  Imaging and Procedures for 08/16/2023  OCT, Retina - OU - Both Eyes       Right Eye Quality was good. Central Foveal Thickness: 305. Progression has improved. Findings include no SRF, abnormal foveal contour, intraretinal fluid, vitreomacular adhesion (persistent IRF/IRHM -- slightly improved, partial PVD).   Left Eye Quality was good. Central Foveal Thickness: 308. Progression has been stable. Findings include normal foveal contour, no IRF, no SRF, intraretinal hyper-reflective material (Stable improvement in cystic changes and punctate IRHM nasal fovea; partial  PVD).   Notes *Images captured and stored on drive  Diagnosis / Impression:  +DME OU OD: persistent IRF/IRHM -- slightly improved, partial PVD OS: Stable improvement in cystic changes and punctate IRHM nasal fovea; partial PVD  Clinical management:  See below  Abbreviations: NFP - Normal foveal profile. CME - cystoid macular edema. PED - pigment epithelial detachment. IRF - intraretinal fluid. SRF - subretinal fluid. EZ - ellipsoid zone. ERM - epiretinal membrane. ORA - outer retinal atrophy. ORT - outer retinal tubulation. SRHM - subretinal hyper-reflective material. IRHM - intraretinal hyper-reflective material     Intravitreal Injection, Pharmacologic Agent - OD - Right Eye       Time Out 08/16/2023. 2:50 PM. Confirmed correct patient, procedure, site, and patient consented.   Anesthesia Topical anesthesia was used. Anesthetic medications included Lidocaine 2%, Proparacaine 0.5%.   Procedure Preparation included 5% betadine to ocular surface, eyelid speculum. A supplied (32g) needle was used.   Injection: 6 mg faricimab-svoa 6 MG/0.05ML   Route: Intravitreal, Site: Right Eye   NDC: [id number], Lot: [id number], Expiration date: 01/12/2025, Waste: 0 mL   Post-op Post injection exam found visual acuity of at least counting fingers. The patient tolerated the procedure well. There were no complications. The patient received written and verbal post procedure care education.            ASSESSMENT/PLAN:   ICD-10-CM   1. 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, associated with type 2 diabetes mellitus (HCC)  E11.3213 OCT, Retina - OU - Both Eyes    Intravitreal Injection, Pharmacologic Agent - OD - Right Eye    faricimab-svoa (VABYSMO) 6mg /0.49mL intravitreal injection    2. Current use of insulin (HCC)  Z79.4     3. Long term (current) use of oral hypoglycemic drugs  Z79.84     4. Essential hypertension  I10     5.  Hypertensive retinopathy of both eyes  H35.033     6. Pseudophakia of both eyes  Z96.1     7. 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 History of herpes zoster of eye  Z86.19     9. Primary open angle glaucoma of both eyes, unspecified glaucoma stage  H40.1130      1-3. 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 Single focal MA superior to fovea; OS: Perifoveal Mas w/ late leakage - s/p IVA OS # 1(07.26.21), #2 (08.24.21), #3 (09.21.21), #4 (10.22.21) -- IVA resistance ========================================================== - s/p IVE OD #1 (09.29.23), #2 (10.30.23), #3 (SAMPLE) (11.27.23) #4 (12.29.23) #5 (01.26.24) #6 (02.23.24) -- IVE resistance - s/p IVE OS #1 (  11.22.21), #2 (12.21.21), #3 (01.20.22), #4 (02.18.22), #5 (03.18.22), #6 (04.15.22), #7 (05.10.22) -- sample, #8 (06.08.22), #9 (07.12.22), #10 (08.12.22), #11 (09.16.22), #12 (10.21.22), #13 (11.18.22), #14 (12.21.22), #15 (01.20.23), #16 (02.17.23), #17 (03.24.23), #18 (04.21.23), #19 (05.26.23), #20 (06.23.23) -- IVE resistance =========================================================== - s/p IVV OD #1 (03.22.24), #2 (04.19.24), #3 (05.17.24), #4 (06.18.24), #5 (07.19.24), #6 (08.23.24), #7 (09.27.24) - s/p IVV OS #1 (07.21.23), #2 (08.25.23), #3 (09.29.23), #4 (10.30.23), #5 (11.27.23) #6 (12.29.23), #7 (01.26.23), #8 (02.23.24), #9 (03.22.24), #10(04.19.24), #11 (05.17.24) - BCVA 20/25 OU -- stable - OCT shows OD: persistent IRF/IRHM -- slightly improved, partial PVD; OS: Stable improvement in cystic changes and punctate IRHM nasal fovea; partial PVD at 5 weeks - recommend IVV OD #8 today, 11.01.24 with f/u in 5 weeks - recommend holding injection OS again today (will treat OS prn) -- pt in agreement  - pt wishes to proceed with injection OD  - RBA of procedure discussed, questions answered  - Vabysmo informed consent obtained and signed, 03.22.24 (OD)  -  Vabysmo informed consent obtained and signed, 07.21.23 (OS) - Eylea approved until 10/04/23 - [employer] approved Vabysmo - see procedure note - f/u 5 weeks -- DFE/OCT, possible injection(s)  4,5. Hypertensive retinopathy OU - discussed importance of tight BP control - continue to monitor  6. Pseudophakia OU  - s/p CE/IOL OS (2020, Dr. Alben Spittle)             - s/p CE/IOL OD (2022, Dr. Alben Spittle)  - s/p YAG cap OS (04.04.23) - BCVA 20/30 OD and 20/25 OS from 20/150  - IOLs in good position, doing well  - continue to monitor  7. 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 Valere Dross in August 2022  - s/p SuperK OS on 12.12.2022  8. History of herpes zoster/iritis OS  - on Valtrex 1 g daily---maintenance  9. POAG OU  - was under the expert management of Dr. Alben Spittle, now follows with Dr. Zenaida Niece  - IOP 18 OU  - continue Cosopt bid OU  Ophthalmic Meds Ordered this visit:  Meds ordered this encounter  Medications   faricimab-svoa (VABYSMO) 6mg /0.59mL intravitreal injection     This document serves as a record of services personally performed by Karie Chimera, MD, PhD. It was created on their behalf by Berlin Hun COT, an ophthalmic technician. The creation of this record is the provider's dictation and/or activities during the visit.    Electronically signed by: Berlin Hun COT 10.31.24 5:00 PM  This document serves as a record of services personally performed by Karie Chimera, MD, PhD. It was created on their behalf by Glee Arvin. Manson Passey, OA an ophthalmic technician. The creation of this record is the provider's dictation and/or activities during the visit.    Electronically signed by: Glee Arvin. Manson Passey, OA 08/16/23 5:00 PM  Karie Chimera, M.D., Ph.D. Diseases & Surgery of the Retina and Vitreous [facility] 08/16/2023   I have reviewed the above documentation for accuracy and completeness, and I agree with the above. Karie Chimera, M.D., Ph.D.  08/16/23 5:01 PM  Abbreviations: M myopia (nearsighted); A astigmatism; H hyperopia (farsighted); P presbyopia; Mrx spectacle prescription;  CTL contact lenses; OD right eye; OS left eye; OU both eyes  XT exotropia; ET esotropia; PEK punctate epithelial keratitis; PEE punctate epithelial erosions; DES dry eye syndrome; MGD meibomian gland dysfunction; ATs artificial tears; PFAT's preservative free artificial tears; NSC nuclear sclerotic cataract; PSC posterior  subcapsular cataract; ERM epi-retinal membrane; PVD posterior vitreous detachment; RD retinal detachment; DM diabetes mellitus; DR diabetic retinopathy; NPDR non-proliferative diabetic retinopathy; PDR proliferative diabetic retinopathy; CSME clinically significant macular edema; DME diabetic macular edema; dbh dot blot hemorrhages; CWS cotton wool spot; POAG primary open angle glaucoma; C/D cup-to-disc ratio; HVF humphrey visual field; GVF goldmann visual field; OCT optical coherence tomography; IOP intraocular pressure; BRVO Branch retinal vein occlusion; CRVO central retinal vein occlusion; CRAO central retinal artery occlusion; BRAO branch retinal artery occlusion; RT retinal tear; SB scleral buckle; PPV pars plana vitrectomy; VH Vitreous hemorrhage; PRP panretinal laser photocoagulation; IVK intravitreal kenalog; VMT vitreomacular traction; MH Macular hole;  NVD neovascularization of the disc; NVE neovascularization elsewhere; AREDS age related eye disease study; ARMD age related macular degeneration; POAG primary open angle glaucoma; EBMD epithelial/anterior basement membrane dystrophy; ACIOL anterior chamber intraocular lens; IOL intraocular lens; PCIOL posterior chamber intraocular lens; Phaco/IOL phacoemulsification with intraocular lens placement; PRK photorefractive keratectomy; LASIK laser assisted in situ keratomileusis; HTN hypertension; DM diabetes mellitus; COPD chronic obstructive pulmonary disease

## 2023-08-16 ENCOUNTER — Encounter (INDEPENDENT_AMBULATORY_CARE_PROVIDER_SITE_OTHER): Payer: Self-pay | Admitting: Ophthalmology

## 2023-08-16 ENCOUNTER — Ambulatory Visit (INDEPENDENT_AMBULATORY_CARE_PROVIDER_SITE_OTHER): Payer: Medicare Other | Admitting: Ophthalmology

## 2023-08-16 DIAGNOSIS — Z7984 Long term (current) use of oral hypoglycemic drugs: Secondary | ICD-10-CM | POA: Diagnosis not present

## 2023-08-16 DIAGNOSIS — Z794 Long term (current) use of insulin: Secondary | ICD-10-CM

## 2023-08-16 DIAGNOSIS — H35033 Hypertensive retinopathy, bilateral: Secondary | ICD-10-CM

## 2023-08-16 DIAGNOSIS — Z8619 Personal history of other infectious and parasitic diseases: Secondary | ICD-10-CM

## 2023-08-16 DIAGNOSIS — E113213 Type 2 diabetes mellitus with mild nonproliferative diabetic retinopathy with macular edema, bilateral: Secondary | ICD-10-CM

## 2023-08-16 DIAGNOSIS — Z961 Presence of intraocular lens: Secondary | ICD-10-CM

## 2023-08-16 DIAGNOSIS — H401130 Primary open-angle glaucoma, bilateral, stage unspecified: Secondary | ICD-10-CM

## 2023-08-16 DIAGNOSIS — H18523 Epithelial (juvenile) corneal dystrophy, bilateral: Secondary | ICD-10-CM

## 2023-08-16 DIAGNOSIS — I10 Essential (primary) hypertension: Secondary | ICD-10-CM | POA: Diagnosis not present

## 2023-08-16 MED ORDER — FARICIMAB-SVOA 6 MG/0.05ML IZ SOSY
6.0000 mg | PREFILLED_SYRINGE | INTRAVITREAL | Status: AC | PRN
  Administered 2023-08-16: 6 mg via INTRAVITREAL

## 2023-08-20 ENCOUNTER — Inpatient Hospital Stay: Payer: Medicare Other | Attending: Hematology

## 2023-08-20 VITALS — BP 138/56 | HR 60 | Temp 98.1°F | Resp 18

## 2023-08-20 DIAGNOSIS — D509 Iron deficiency anemia, unspecified: Secondary | ICD-10-CM | POA: Diagnosis present

## 2023-08-20 DIAGNOSIS — I10 Essential (primary) hypertension: Secondary | ICD-10-CM | POA: Diagnosis not present

## 2023-08-20 DIAGNOSIS — Z79899 Other long term (current) drug therapy: Secondary | ICD-10-CM | POA: Diagnosis not present

## 2023-08-20 DIAGNOSIS — D500 Iron deficiency anemia secondary to blood loss (chronic): Secondary | ICD-10-CM

## 2023-08-20 MED ORDER — SODIUM CHLORIDE 0.9% FLUSH: 10.0000 mL | Freq: Two times a day (BID) | INTRAVENOUS | Status: DC

## 2023-08-20 MED ORDER — FERUMOXYTOL INJECTION 510 MG/17 ML
510.0000 mg | Freq: Once | INTRAVENOUS | Status: AC
  Administered 2023-08-20: 510 mg via INTRAVENOUS
  Filled 2023-08-20: qty 17

## 2023-08-20 MED ORDER — SODIUM CHLORIDE 0.9 % IV SOLN: INTRAVENOUS | Status: DC

## 2023-08-20 NOTE — Progress Notes (Signed)
Patient presents today for Feraheme, patient reports taking pre-meds at home. Patient tolerated iron infusion with no complaints voiced. Peripheral IV site clean and dry with good blood return noted before and after infusion. Patient refused to wait 30 min wait time. Band aid applied. VSS with discharge and left in satisfactory condition with no s/s of distress noted.

## 2023-08-20 NOTE — Patient Instructions (Signed)
[facility]  Discharge Instructions: Thank you for choosing [facility] to provide your oncology and hematology care.  If you have a lab appointment with the Cancer Center - please note that after April 8th, 2024, all labs will be drawn in the cancer center.  You do not have to check in or register with the main entrance as you have in the past but will complete your check-in in the cancer center.  Wear comfortable clothing and clothing appropriate for easy access to any Portacath or PICC line.   We strive to give you quality time with your provider. You may need to reschedule your appointment if you arrive late (15 or more minutes).  Arriving late affects you and other patients whose appointments are after yours.  Also, if you miss three or more appointments without notifying the office, you may be dismissed from the clinic at the provider's discretion.      For prescription refill requests, have your pharmacy contact our office and allow 72 hours for refills to be completed.    Today you received the following [facility], return as scheduled.   To help prevent nausea and vomiting after your treatment, we encourage you to take your nausea medication as directed.  BELOW ARE SYMPTOMS THAT SHOULD BE REPORTED IMMEDIATELY: *FEVER GREATER THAN 100.4 F (38 C) OR HIGHER *CHILLS OR SWEATING *NAUSEA AND VOMITING THAT IS NOT CONTROLLED WITH YOUR NAUSEA MEDICATION *UNUSUAL SHORTNESS OF BREATH *UNUSUAL BRUISING OR BLEEDING *URINARY PROBLEMS (pain or burning when urinating, or frequent urination) *BOWEL PROBLEMS (unusual diarrhea, constipation, pain near the anus) TENDERNESS IN MOUTH AND THROAT WITH OR WITHOUT PRESENCE OF ULCERS (sore throat, sores in mouth, or a toothache) UNUSUAL RASH, SWELLING OR PAIN  UNUSUAL VAGINAL DISCHARGE OR ITCHING   Items with * indicate a potential emergency and should be followed up as soon as possible  or go to the Emergency Department if any problems should occur.  Please show the CHEMOTHERAPY ALERT CARD or IMMUNOTHERAPY ALERT CARD at check-in to the Emergency Department and triage nurse.  Should you have questions after your visit or need to cancel or reschedule your appointment, please contact [facility] - A DEPT OF Eligha Bridegroom [facility] [phone]  and follow the prompts.  Office hours are 8:00 a.m. to 4:30 p.m. Monday - Friday. Please note that voicemails left after 4:00 p.m. may not be returned until the following business day.  We are closed weekends and major holidays. You have access to a nurse at all times for urgent questions. Please call the main number to the clinic [phone] and follow the prompts.  For any non-urgent questions, you may also contact your provider using MyChart. We now offer e-Visits for anyone 42 and older to request care online for non-urgent symptoms. For details visit [email or web address].   Also download the MyChart app! Go to the app store, search "MyChart", open the app, select [place], and log in with your MyChart username and password.

## 2023-08-23 ENCOUNTER — Encounter: Payer: Self-pay | Admitting: Internal Medicine

## 2023-08-23 ENCOUNTER — Ambulatory Visit (INDEPENDENT_AMBULATORY_CARE_PROVIDER_SITE_OTHER): Payer: Medicare Other | Admitting: Internal Medicine

## 2023-08-23 VITALS — BP 134/56 | HR 66 | Temp 97.8°F | Resp 16 | Ht 71.0 in | Wt 245.0 lb

## 2023-08-23 DIAGNOSIS — B9689 Other specified bacterial agents as the cause of diseases classified elsewhere: Secondary | ICD-10-CM | POA: Diagnosis not present

## 2023-08-23 DIAGNOSIS — J019 Acute sinusitis, unspecified: Secondary | ICD-10-CM

## 2023-08-23 MED ORDER — AMOXICILLIN-POT CLAVULANATE 875-125 MG PO TABS: 1.0000 | ORAL_TABLET | Freq: Two times a day (BID) | ORAL | 0 refills | Status: DC

## 2023-08-23 NOTE — Patient Instructions (Signed)
It was a pleasure to see you today.  Thank you for giving [place] the opportunity to be involved in your care.  Below is a brief recap of your visit and next steps.  We will plan to see you again in January.  Summary Augmentin x 5 days prescribed for sinus infection Follow up as scheduled in January but return to care if symptoms worsen or fail to improve

## 2023-08-23 NOTE — Progress Notes (Signed)
   Acute Office Visit  Subjective:     Patient ID: Lucas Dudley, male    DOB: 24-Jun-1941, 82 y.o.   MRN: [id number]  Chief Complaint  Patient presents with   Nasal Congestion    X 3-4 days, draining into chest, coughing up mostly whitish but sometimes brown looking phlegm.    Mr. Jindra presents today for an acute visit endorsing a 3-4-day history of sinus pain and congestion.  His nasal secretions have turned cloudy white with hints of brown.  He endorses chills but has not checked his temperature.  He is unaware of any sick contacts.  He has tried taking over-the-counter antihistamines without significant symptom relief.  Review of Systems  HENT:  Positive for congestion and sinus pain.       Objective:    BP (!) 134/56   Pulse 66   Temp 97.8 F (36.6 C) (Oral)   Resp 16   Ht 5\' 11"  (1.803 m)   Wt 245 lb (111.1 kg)   SpO2 (!) 87%   BMI 34.17 kg/m   Physical Exam Vitals reviewed.  Constitutional:      Appearance: Normal appearance. He is obese.  HENT:     Head: Normocephalic and atraumatic.     Right Ear: Tympanic membrane and external ear normal.     Left Ear: Tympanic membrane and external ear normal.     Nose: Congestion and rhinorrhea present.     Comments: Tenderness to palpation over the maxillary sinuses    Mouth/Throat:     Mouth: Mucous membranes are moist.     Pharynx: Posterior oropharyngeal erythema present.  Eyes:     Extraocular Movements: Extraocular movements intact.     Conjunctiva/sclera: Conjunctivae normal.     Pupils: Pupils are equal, round, and reactive to light.  Cardiovascular:     Rate and Rhythm: Normal rate and regular rhythm.     Pulses: Normal pulses.     Heart sounds: Normal heart sounds.  Pulmonary:     Effort: Pulmonary effort is normal.     Breath sounds: Rhonchi present.  Musculoskeletal:     Cervical back: Normal range of motion.  Lymphadenopathy:     Cervical: No cervical adenopathy.  Neurological:     Mental Status: He  is alert.       Assessment & Plan:   Problem List Items Addressed This Visit       Acute bacterial sinusitis - Primary    Presenting today for an acute visit endorsing a 3-4-day history of sinus congestion and pressure as noted above.  Secretions are cloudy white with specks of brown.  Subjective chills but has not checked his temperature.  There is tenderness palpation over the maxillary sinuses on exam today. -Augmentin x 5 days prescribed for treatment of bacterial sinusitis.  Recommend continued supportive care measures.  We specifically discussed using nasal saline rinse.  He was instructed to return to care if symptoms worsen or fail to improve.  Otherwise, he is scheduled for routine follow-up with Dr. Allena Katz in January 2025.       Meds ordered this encounter  Medications   amoxicillin-clavulanate (AUGMENTIN) 875-125 MG tablet    Sig: Take 1 tablet by mouth 2 (two) times daily for 5 days.    Dispense:  10 tablet    Refill:  0    Return if symptoms worsen or fail to improve.  Billie Lade, MD

## 2023-08-23 NOTE — Assessment & Plan Note (Signed)
Presenting today for an acute visit endorsing a 3-4-day history of sinus congestion and pressure as noted above.  Secretions are cloudy white with specks of brown.  Subjective chills but has not checked his temperature.  There is tenderness palpation over the maxillary sinuses on exam today. -Augmentin x 5 days prescribed for treatment of bacterial sinusitis.  Recommend continued supportive care measures.  We specifically discussed using nasal saline rinse.  He was instructed to return to care if symptoms worsen or fail to improve.  Otherwise, he is scheduled for routine follow-up with Dr. Allena Katz in January 2025.

## 2023-08-28 ENCOUNTER — Inpatient Hospital Stay: Payer: Medicare Other

## 2023-08-28 ENCOUNTER — Telehealth: Payer: Self-pay | Admitting: Internal Medicine

## 2023-08-28 ENCOUNTER — Other Ambulatory Visit: Payer: Self-pay | Admitting: Internal Medicine

## 2023-08-28 VITALS — BP 148/59 | HR 65 | Temp 99.0°F | Resp 20

## 2023-08-28 DIAGNOSIS — D500 Iron deficiency anemia secondary to blood loss (chronic): Secondary | ICD-10-CM

## 2023-08-28 DIAGNOSIS — D509 Iron deficiency anemia, unspecified: Secondary | ICD-10-CM | POA: Diagnosis not present

## 2023-08-28 DIAGNOSIS — B9689 Other specified bacterial agents as the cause of diseases classified elsewhere: Secondary | ICD-10-CM

## 2023-08-28 MED ORDER — SODIUM CHLORIDE 0.9 % IV SOLN: INTRAVENOUS | Status: DC

## 2023-08-28 MED ORDER — SODIUM CHLORIDE 0.9 % IV SOLN
510.0000 mg | Freq: Once | INTRAVENOUS | Status: AC
  Administered 2023-08-28: 510 mg via INTRAVENOUS
  Filled 2023-08-28: qty 510

## 2023-08-28 MED ORDER — AMOXICILLIN-POT CLAVULANATE 875-125 MG PO TABS: 1.0000 | ORAL_TABLET | Freq: Two times a day (BID) | ORAL | 0 refills | Status: AC

## 2023-08-28 NOTE — Telephone Encounter (Signed)
Patient came by the office asking for another round of antibiotic if can send in.  amoxicillin-clavulanate   Pharmacy: [facility]

## 2023-08-28 NOTE — Telephone Encounter (Signed)
Left message

## 2023-08-28 NOTE — Progress Notes (Signed)
Patient presents today for Ferahme infusion per providers order.  Vital signs WNL.  Patient has no new complaints at this time.  Peripheral IV started blood return noted pre and post infusion.  Stable during infusion without adverse affects.  Vital signs stable.  No complaints at this time.  Discharge from clinic ambulatory in stable condition.  Alert and oriented X 3.  Follow up with [facility] as scheduled.

## 2023-08-28 NOTE — Patient Instructions (Signed)
[facility] - [facility]  Discharge Instructions: Thank you for choosing [facility] to provide your oncology and hematology care.  If you have a lab appointment with the Cancer Center - please note that after April 8th, 2024, all labs will be drawn in the cancer center.  You do not have to check in or register with the main entrance as you have in the past but will complete your check-in in the cancer center.  Wear comfortable clothing and clothing appropriate for easy access to any Portacath or PICC line.   We strive to give you quality time with your provider. You may need to reschedule your appointment if you arrive late (15 or more minutes).  Arriving late affects you and other patients whose appointments are after yours.  Also, if you miss three or more appointments without notifying the office, you may be dismissed from the clinic at the provider's discretion.      For prescription refill requests, have your pharmacy contact our office and allow 72 hours for refills to be completed.    Today you received the following chemotherapy and/or immunotherapy agents Feraheme      To help prevent nausea and vomiting after your treatment, we encourage you to take your nausea medication as directed.  BELOW ARE SYMPTOMS THAT SHOULD BE REPORTED IMMEDIATELY: *FEVER GREATER THAN 100.4 F (38 C) OR HIGHER *CHILLS OR SWEATING *NAUSEA AND VOMITING THAT IS NOT CONTROLLED WITH YOUR NAUSEA MEDICATION *UNUSUAL SHORTNESS OF BREATH *UNUSUAL BRUISING OR BLEEDING *URINARY PROBLEMS (pain or burning when urinating, or frequent urination) *BOWEL PROBLEMS (unusual diarrhea, constipation, pain near the anus) TENDERNESS IN MOUTH AND THROAT WITH OR WITHOUT PRESENCE OF ULCERS (sore throat, sores in mouth, or a toothache) UNUSUAL RASH, SWELLING OR PAIN  UNUSUAL VAGINAL DISCHARGE OR ITCHING   Items with * indicate a potential emergency and should be followed up  as soon as possible or go to the Emergency Department if any problems should occur.  Please show the CHEMOTHERAPY ALERT CARD or IMMUNOTHERAPY ALERT CARD at check-in to the Emergency Department and triage nurse.  Should you have questions after your visit or need to cancel or reschedule your appointment, please contact [facility] - A DEPT OF [facility] [phone]  and follow the prompts.  Office hours are 8:00 a.m. to 4:30 p.m. Monday - Friday. Please note that voicemails left after 4:00 p.m. may not be returned until the following business day.  We are closed weekends and major holidays. You have access to a nurse at all times for urgent questions. Please call the main number to the clinic [phone] and follow the prompts.  For any non-urgent questions, you may also contact your provider using MyChart. We now offer e-Visits for anyone 65 and older to request care online for non-urgent symptoms. For details visit [email or web address].   Also download the MyChart app! Go to the app store, search "MyChart", open the app, select [place], and log in with your MyChart username and password.

## 2023-09-19 NOTE — Progress Notes (Signed)
[facility] - Clinic Note  09/20/2023    CHIEF COMPLAINT Patient presents for Retina Follow Up  HISTORY OF PRESENT ILLNESS: Lucas Dudley is a 82 y.o. male who presents to the clinic today for:  HPI     Retina Follow Up   Patient presents with  Diabetic Retinopathy.  In right eye.  This started 5 weeks ago.  Duration of 5 weeks.  Since onset it is stable.  I, the attending physician,  performed the HPI with the patient and updated documentation appropriately.        Comments   Retina follow up NPDR pt is reporting no vision changes noticed he has some floaters he denies any flashes pt states when his blood sugar drops he looses vision in OS gets better after eating last reading 91      Last edited by Rennis Chris, MD on 09/20/2023  4:09 PM.     Pt states he is not having any problems with his vision, he is not using any drops for dry eyes right now   Referring physician: Anabel Halon, MD [address] [place],  [place] [address]  HISTORICAL INFORMATION:   Selected notes from the MEDICAL RECORD NUMBER Referred by Dr. Alben Spittle for DEE   CURRENT MEDICATIONS: Current Outpatient Medications (Ophthalmic Drugs)  Medication Sig   Artificial Tear Ointment (DRY EYES OP) Place 1 drop into both eyes daily as needed (Dry eye).   COMBIGAN 0.2-0.5 % ophthalmic solution Place 1 drop into both eyes 2 (two) times daily.   No current facility-administered medications for this visit. (Ophthalmic Drugs)   Current Outpatient Medications (Other)  Medication Sig   acetaminophen (TYLENOL) 650 MG CR tablet Take 1,300 mg by mouth every 8 (eight) hours as needed for pain.   amiodarone (PACERONE) 200 MG tablet TAKE 1 TABLET BY MOUTH EVERY DAY   Ascorbic Acid (VITAMIN C) 100 MG tablet Take 100 mg by mouth daily.   aspirin 81 MG chewable tablet Chew 81 mg by mouth daily.   BERBERINE CHLORIDE PO Take by mouth daily.   camphor-menthol (ANTI-ITCH) lotion Apply 1 Application  topically daily as needed for itching.   carvedilol (COREG) 3.125 MG tablet TAKE 1 TABLET BY MOUTH 2 TIMES DAILY.   CRANBERRY PO Take 1 tablet by mouth daily.   cyanocobalamin (VITAMIN B12) 1000 MCG tablet Take 1 tablet (1,000 mcg total) by mouth daily.   ELDERBERRY PO Take 2 tablets by mouth daily. Chewable   ferrous sulfate 325 (65 FE) MG tablet Take 1 tablet (325 mg total) by mouth daily with breakfast.   fluticasone (FLONASE) 50 MCG/ACT nasal spray SPRAY 2 SPRAYS INTO EACH NOSTRIL EVERY DAY   gemfibrozil (LOPID) 600 MG tablet Take 600 mg by mouth at bedtime.    glimepiride (AMARYL) 2 MG tablet Take 1 tablet (2 mg total) by mouth 2 (two) times daily with a meal.   Insulin Glargine (BASAGLAR KWIKPEN) 100 UNIT/ML Inject 45 Units into the skin at bedtime.   lisinopril (ZESTRIL) 40 MG tablet Take 1 tablet (40 mg total) by mouth daily.   magnesium gluconate (MAGONATE) 500 MG tablet Take 500 mg by mouth daily.   metFORMIN (GLUCOPHAGE-XR) 500 MG 24 hr tablet Take 1,000 mg by mouth in the morning and at bedtime.   mineral oil liquid Take 15 mLs by mouth daily as needed for moderate constipation.   Multiple Vitamins-Minerals (MULTIVITAMIN WITH MINERALS) tablet Take 1 tablet by mouth daily. Centrum men 50+  Omega-3 1000 MG CAPS Take 1,000 mg by mouth daily.   OVER THE COUNTER MEDICATION Take 1 tablet by mouth daily. Beets Chew   pantoprazole (PROTONIX) 40 MG tablet TAKE 1 TABLET BY MOUTH EVERY DAY   torsemide (DEMADEX) 20 MG tablet TAKE 1 TABLET BY MOUTH TWICE A DAY   triamcinolone cream (KENALOG) 0.1 % Apply 1 Application topically 2 (two) times daily as needed (itch).   valACYclovir (VALTREX) 1000 MG tablet Take 1,000 mg by mouth at bedtime.   No current facility-administered medications for this visit. (Other)   REVIEW OF SYSTEMS: ROS   Positive for: Gastrointestinal, Endocrine, Cardiovascular, Eyes, Respiratory Negative for: Constitutional, Neurological, Skin, Genitourinary, Musculoskeletal,  HENT, Psychiatric, Allergic/Imm, Heme/Lymph Last edited by Etheleen Mayhew, COT on 09/20/2023  2:06 PM.      ALLERGIES Allergies  Allergen Reactions   Azithromycin Nausea Only   Tape Rash and Other (See Comments)    Causes skin redness, Use paper tape only.   PAST MEDICAL HISTORY Past Medical History:  Diagnosis Date   Arthritis    Atrial fibrillation (HCC)    CAD (coronary artery disease)    a. Cath 03/17/15 showing 100% ostial D1, 50% prox LAD to mid LAD, 40% RPDA stenosis. Med rx. // Myoview 01/2020: EF 31 diffuse perfusion defect without reversibility (suspect artifact); reviewed with Dr. Tama High study felt to be low risk   Cataract    Mixed form OD   Chronic diastolic CHF (congestive heart failure) (HCC)    Diabetic retinopathy (HCC)    NPDR OU   Essential hypertension    Glaucoma    POAG OU   History of gout    Hyperlipidemia    Hypertensive retinopathy    OU   Kidney stones    Melanoma of neck (HCC)    NICM (nonischemic cardiomyopathy) (HCC)    OSA on CPAP 2012   Prostate cancer (HCC)    Type II diabetes mellitus (HCC)    Past Surgical History:  Procedure Laterality Date   ABDOMINAL HERNIA REPAIR     w/mesh   ATRIAL FIBRILLATION ABLATION N/A 06/06/2021   Procedure: ATRIAL FIBRILLATION ABLATION;  Surgeon: Hillis Range, MD;  Location: [facility] IN[facility]ASIVE CV LAB;  Service: Cardiovascular;  Laterality: N/A;   BIOPSY  05/24/2022   Procedure: BIOPSY;  Surgeon: Corbin Ade, MD;  Location: [facility];  Service: Endoscopy;;   BIOPSY  05/30/2022   Procedure: BIOPSY;  Surgeon: Corbin Ade, MD;  Location: [facility];  Service: Endoscopy;;   BIOPSY  01/10/2023   Procedure: BIOPSY;  Surgeon: Lemar Lofty., MD;  Location: [facility];  Service: Gastroenterology;;   CARDIAC CATHETERIZATION N/A 03/17/2015   Procedure: Left Heart Cath and Coronary Angiography;  Surgeon: Runell Gess, MD;  Location: [facility] INVASIVE CV LAB;  Service: Cardiovascular;   Laterality: N/A;   CARDIOVERSION N/A 08/09/2021   Procedure: CARDIOVERSION;  Surgeon: Chilton Si, MD;  Location: [facility] ENDOSCOPY;  Service: Cardiovascular;  Laterality: N/A;   CARDIOVERSION N/A 11/10/2021   Procedure: CARDIOVERSION;  Surgeon: Thomasene Ripple, DO;  Location: [facility] ENDOSCOPY;  Service: Cardiovascular;  Laterality: N/A;   carotid doppler  09/17/2008   rigt and left ICAs 0-49%;mildly  abnormal   CATARACT EXTRACTION Left 2020   Dr. Alben Spittle   COLONOSCOPY N/A 05/16/2017   Procedure: COLONOSCOPY;  Surgeon: Malissa Hippo, MD;  Location: [facility];  Service: Endoscopy;  Laterality: N/A;  930   COLONOSCOPY WITH PROPOFOL N/A 05/24/2022   Procedure: COLONOSCOPY WITH  PROPOFOL;  Surgeon: Corbin Ade, MD;  Location: [facility];  Service: Endoscopy;  Laterality: N/A;   DOPPLER ECHOCARDIOGRAPHY  05/25/2009   EF 50-55%,LA mildly dilated, LV function normal   ELECTROPHYSIOLOGIC STUDY N/A 04/05/2015   Procedure: Cardioversion;  Surgeon: Thurmon Fair, MD;  Location: [facility];  Service: Cardiovascular;  Laterality: N/A;   ELECTROPHYSIOLOGIC STUDY N/A 09/06/2015   Procedure: Atrial Fibrillation Ablation;  Surgeon: Hillis Range, MD;  Location: [facility] INVASIVE CV LAB;  Service: Cardiovascular;  Laterality: N/A;   ELECTROPHYSIOLOGIC STUDY N/A 07/12/2016   redo afib ablation by Dr Johney Frame   ENTEROSCOPY N/A 05/30/2022   Procedure: ENTEROSCOPY;  Surgeon: Corbin Ade, MD;  [facility]: [facility];  Service: Endoscopy;  Laterality: N/A;   ESOPHAGOGASTRODUODENOSCOPY (EGD) WITH PROPOFOL N/A 05/24/2022   Procedure: ESOPHAGOGASTRODUODENOSCOPY (EGD) WITH PROPOFOL;  Surgeon: Corbin Ade, MD;  [facility]: [facility];  Service: Endoscopy;  Laterality: N/A;   ESOPHAGOGASTRODUODENOSCOPY (EGD) WITH PROPOFOL N/A 01/10/2023   Procedure: ESOPHAGOGASTRODUODENOSCOPY (EGD) WITH PROPOFOL;  Surgeon: Meridee Score Netty Starring., MD;  Location: [facility] ENDOSCOPY;  Service: Gastroenterology;  Laterality: N/A;   EUS  N/A 01/10/2023   Procedure: UPPER ENDOSCOPIC ULTRASOUND (EUS) RADIAL;  Surgeon: Lemar Lofty., MD;  Location: [facility] ENDOSCOPY;  Service: Gastroenterology;  Laterality: N/A;   EXCISIONAL HEMORRHOIDECTOMY     "inside and out"   EYE SURGERY Left 2020   Cat Sx - Dr. Alben Spittle   FINE NEEDLE ASPIRATION Right    knee; "drew ~ 1 quart off"   GIVENS CAPSULE STUDY N/A 05/25/2022   Procedure: GIVENS CAPSULE STUDY;  Surgeon: Lanelle Bal, DO;  Location: [facility];  Service: Endoscopy;  Laterality: N/A;   GIVENS CAPSULE STUDY N/A 01/23/2023   Procedure: GIVENS CAPSULE STUDY;  Surgeon: Dolores Frame, MD;  Location: [facility];  Service: Gastroenterology;  Laterality: N/A;  8:30am   HERNIA REPAIR     HOT HEMOSTASIS N/A 01/10/2023   Procedure: HOT HEMOSTASIS (ARGON PLASMA COAGULATION/BICAP);  Surgeon: Lemar Lofty., MD;  Location: [facility];  Service: Gastroenterology;  Laterality: N/A;   LAPAROSCOPIC CHOLECYSTECTOMY     LEFT ATRIAL APPENDAGE OCCLUSION N/A 06/28/2022   Procedure: LEFT ATRIAL APPENDAGE OCCLUSION;  Surgeon: Tonny Bollman, MD;  Location: [facility] INVASIVE CV LAB;  Service: Cardiovascular;  Laterality: N/A;   MELANOMA EXCISION Right    "neck"   NM MYOCAR PERF WALL MOTION  02/21/2012   EF 61% ,EXERCISE 7 METS. exercise stopped due to wheezing and shortness of breathe   POLYPECTOMY  05/16/2017   Procedure: POLYPECTOMY;  Surgeon: Malissa Hippo, MD;  [facility]: [facility];  Service: Endoscopy;;  colon   POLYPECTOMY  05/24/2022   Procedure: POLYPECTOMY;  Surgeon: Corbin Ade, MD;  [facility]: [facility];  Service: Endoscopy;;   POLYPECTOMY  01/10/2023   Procedure: POLYPECTOMY;  Surgeon: Meridee Score Netty Starring., MD;  Location: [facility];  Service: Gastroenterology;;   PROSTATECTOMY     SHOULDER OPEN ROTATOR CUFF REPAIR Right X 2   SUBMUCOSAL TATTOO INJECTION  05/30/2022   Procedure: SUBMUCOSAL TATTOO INJECTION;  Surgeon: Corbin Ade, MD;  Location: [facility];  Service: Endoscopy;;   SUBMUCOSAL TATTOO INJECTION  01/10/2023   Procedure: SUBMUCOSAL TATTOO INJECTION;  Surgeon: Lemar Lofty., MD;  Location: [facility];  Service: Gastroenterology;;   TEE WITHOUT CARDIOVERSION N/A 09/05/2015   Procedure: TRANSESOPHAGEAL ECHOCARDIOGRAM (TEE);  Surgeon: Quintella Reichert, MD;  Location: [facility] ENDOSCOPY;  Service: Cardiovascular;  Laterality: N/A;  TEE WITHOUT CARDIOVERSION N/A 06/28/2022   Procedure: TRANSESOPHAGEAL ECHOCARDIOGRAM (TEE);  Surgeon: Tonny Bollman, MD;  Location: [facility] INVASIVE CV LAB;  Service: Cardiovascular;  Laterality: N/A;   FAMILY HISTORY Family History  Problem Relation Age of Onset   Heart disease Mother    Lung cancer Mother    Heart attack Mother 66   Diabetes Father    Heart disease Father    Stroke Brother    Healthy Daughter    Colon cancer Maternal Aunt        72s   SOCIAL HISTORY Social History   Tobacco Use   Smoking status: Former    Current packs/day: 0.00    Average packs/day: 2.0 packs/day for 27.0 years (54.0 ttl pk-yrs)    Types: Cigarettes    Start date: 41    Quit date: 1992    Years since quitting: 32.9    Passive exposure: Current   Smokeless tobacco: Never   Tobacco comments:    Former smoker 09/27/21  Vaping Use   Vaping status: Never Used  Substance Use Topics   Alcohol use: No    Alcohol/week: 0.0 standard drinks of alcohol    Comment: "used to drink; stopped ~ 2008"   Drug use: No       OPHTHALMIC EXAM: Base Eye Exam     Visual Acuity (Snellen - Linear)       Right Left   Dist cc 20/25 -2 20/25 -2   Dist ph cc NI NI    Correction: Glasses         Tonometry (Tonopen, 2:14 PM)       Right Left   Pressure 21 22  Pt squeezing         Pupils       Pupils Dark Light Shape React APD   Right PERRL 2 1 Round Brisk None   Left PERRL 2 1 Round Brisk None         Visual Fields       Left Right    Full Full         Extraocular Movement        Right Left    Full, Ortho Full, Ortho         Neuro/Psych     Oriented x3: Yes   Mood/Affect: Normal         Dilation     Both eyes: 2.5% Phenylephrine @ 2:14 PM           Slit Lamp and Fundus Exam     Slit Lamp Exam       Right Left   Lids/Lashes Dermato, mild MGD Dermato, mild MGD   Conjunctiva/Sclera Temporal pinguecula, mild inferior sub conj heme Temporal pinguecula   Cornea EBMD, mild haze, trace PEE, mild Debris in tear film, well healed cataract wound trace haze, trace PEE, well healed temporal cataract wounds, arcus   Anterior Chamber Deep and clear; narrow temporal angle Deep and clear   Iris Round and dilated, mild anterior bowing, No NVI Round and moderately dilated to 5.96mm   Lens PCIOL in good position, trace PCO PC IOL in good position with open PC   Anterior Vitreous Synerisis Synerisis         Fundus Exam       Right Left   Disc Mild pallor, sharp rim, +cupping, thin inferior rim Mild pallor, sharp rim, +cupping, +PPA, punctate heme at 0900 -- resolved   C/D Ratio 0.7 0.6   Macula Flat,  blunted foveal reflex, cystic changes - slightly improved, RPE mottling and clumping, no heme, Drusen, punctate Exudates -- resolved Flat, blunted foveal reflex, no heme, cystic changes -- re-developed   Vessels attenuated, Tortuous attenuated, Tortuous   Periphery Attached, rare MA, forcal DBH nasal to disc--improved Attached. No heme.           Refraction     Wearing Rx       Sphere Cylinder Axis Add   Right -0.25 +2.00 158 +2.50   Left -1.00 +1.25 008 +2.50           IMAGING AND PROCEDURES  Imaging and Procedures for 09/20/2023  OCT, Retina - OU - Both Eyes       Right Eye Quality was good. Central Foveal Thickness: 299. Progression has improved. Findings include no SRF, abnormal foveal contour, intraretinal fluid, vitreomacular adhesion (persistent IRF/IRHM -- slightly improved, partial PVD).   Left Eye Quality was good. Central Foveal  Thickness: 323. Progression has worsened. Findings include normal foveal contour, no IRF, no SRF, intraretinal hyper-reflective material (Interval increase in cystic changes and punctate IRHM IN fovea; partial PVD).   Notes *Images captured and stored on drive  Diagnosis / Impression:  +DME OU OD: persistent IRF/IRHM -- slightly improved, partial PVD OS: Interval increase in cystic changes and punctate IRHM IN fovea; partial PVD  Clinical management:  See below  Abbreviations: NFP - Normal foveal profile. CME - cystoid macular edema. PED - pigment epithelial detachment. IRF - intraretinal fluid. SRF - subretinal fluid. EZ - ellipsoid zone. ERM - epiretinal membrane. ORA - outer retinal atrophy. ORT - outer retinal tubulation. SRHM - subretinal hyper-reflective material. IRHM - intraretinal hyper-reflective material     Intravitreal Injection, Pharmacologic Agent - OD - Right Eye       Time Out 09/20/2023. 3:00 PM. Confirmed correct patient, procedure, site, and patient consented.   Anesthesia Topical anesthesia was used. Anesthetic medications included Lidocaine 2%, Proparacaine 0.5%.   Procedure Preparation included 5% betadine to ocular surface, eyelid speculum. A supplied (32g) needle was used.   Injection: 6 mg faricimab-svoa 6 MG/0.05ML   Route: Intravitreal, Site: Right Eye   NDC: R2083049, Lot: [id number], Expiration date: 01/12/2025, Waste: 0 mL   Post-op Post injection exam found visual acuity of at least counting fingers. The patient tolerated the procedure well. There were no complications. The patient received written and verbal post procedure care education.      Intravitreal Injection, Pharmacologic Agent - OS - Left Eye       Time Out 09/20/2023. 3:20 PM. Confirmed correct patient, procedure, site, and patient consented.   Anesthesia Topical anesthesia was used. Anesthetic medications included Lidocaine 2%, Proparacaine 0.5%.   Procedure Preparation  included 5% betadine to ocular surface, eyelid speculum. A (32g) needle was used.   Injection: 6 mg faricimab-svoa 6 MG/0.05ML   Route: Intravitreal, Site: Left Eye   NDC: 54098-119-14, Lot: [id number], Expiration date: 08/14/2024, Waste: 0 mL   Post-op Post injection exam found visual acuity of at least counting fingers. The patient tolerated the procedure well. There were no complications. The patient received written and verbal post procedure care education. Post injection medications were not given.            ASSESSMENT/PLAN:   ICD-10-CM   1. 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 Current use of insulin (HCC)  Z79.4     3. Long term (current) use of oral hypoglycemic drugs  Z79.84     4. Essential hypertension  I10     5. Hypertensive retinopathy of both eyes  H35.033     6. Pseudophakia of both eyes  Z96.1     7. 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 History of herpes zoster of eye  Z86.19     9. Primary open angle glaucoma of both eyes, unspecified glaucoma stage  H40.1130       1-3. 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 Single focal MA superior to fovea; OS: Perifoveal Mas w/ late leakage - s/p IVA OS # 1(07.26.21), #2 (08.24.21), #3 (09.21.21), #4 (10.22.21) -- IVA resistance ========================================================== - s/p IVE OD #1 (09.29.23), #2 (10.30.23), #3 (SAMPLE) (11.27.23) #4 (12.29.23) #5 (01.26.24) #6 (02.23.24) -- IVE resistance - s/p IVE OS #1 (11.22.21), #2 (12.21.21), #3  (01.20.22), #4 (02.18.22), #5 (03.18.22), #6 (04.15.22), #7 (05.10.22) -- sample, #8 (06.08.22), #9 (07.12.22), #10 (08.12.22), #11 (09.16.22), #12 (10.21.22), #13 (11.18.22), #14 (12.21.22), #15 (01.20.23), #16 (02.17.23), #17 (03.24.23), #18 (04.21.23), #19 (05.26.23), #20 (06.23.23) -- IVE resistance =========================================================== - s/p IVV OD #1 (03.22.24), #2 (04.19.24), #3 (05.17.24), #4 (06.18.24), #5 (07.19.24), #6 (08.23.24), #7 (09.27.24), #8 (11.01.24) - s/p IVV OS #1 (07.21.23), #2 (08.25.23), #3 (09.29.23), #4 (10.30.23), #5 (11.27.23) #6 (12.29.23), #7 (01.26.23), #8 (02.23.24), #9 (03.22.24), #10(04.19.24), #11 (05.17.24) - BCVA 20/25 OU -- stable - OCT shows OD: persistent IRF/IRHM -- slightly improved at 5 wks, partial PVD; OS: Interval increase in cystic changes and punctate IRHM IN fovea; partial PVD at 7 mos since last injxn OS - recommend IVV OU (OD #9 and OS #12) today, 12.06.24 with f/u in 5 weeks - treating OS prn  - pt wishes to proceed with injections OU  - RBA of procedure discussed, questions answered  - Vabysmo informed consent obtained and signed, 03.22.24 (OD)  - Vabysmo informed consent obtained and signed, 07.21.23 (OS) - Eylea approved until 10/04/23 - [employer] approved Vabysmo - see procedure note - f/u 5 weeks -- DFE/OCT, possible injection(s)  4,5. Hypertensive retinopathy OU - discussed importance of tight BP control - continue to monitor  6. Pseudophakia OU  - s/p CE/IOL OS (2020, Dr. Alben Spittle)             - s/p CE/IOL OD (2022, Dr. Alben Spittle)  - s/p YAG cap OS (04.04.23) - BCVA 20/30 OD and 20/25 OS from 20/150  - IOLs in good position, doing well  - continue to monitor  7. 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 Valere Dross in August 2022  - s/p SuperK OS on 12.12.2022  8. History of herpes zoster/iritis OS  - on Valtrex 1 g daily---maintenance  9. POAG OU  - was under the expert management of Dr. Alben Spittle, now follows  with Dr. Zenaida Niece  - IOP 21,22  - continue Cosopt bid OU  Ophthalmic Meds Ordered this visit:  Meds ordered this encounter  Medications   faricimab-svoa (VABYSMO) 6mg /0.40mL intravitreal injection   faricimab-svoa (VABYSMO) 6mg /0.17mL intravitreal injection    This document serves as a record of services personally performed by Arlys Velton  Wynona Neat, MD, PhD. It was created on their behalf by Berlin Hun COT, an ophthalmic technician. The creation of this record is the provider's dictation and/or activities during the visit.    Electronically signed by: Berlin Hun COT 12.05.24 4:10 PM  This document serves as a record of services personally performed by Karie Chimera, MD, PhD. It was created on their behalf by Glee Arvin. Manson Passey, OA an ophthalmic technician. The creation of this record is the provider's dictation and/or activities during the visit.    Electronically signed by: Glee Arvin. Manson Passey, OA 09/20/23 4:10 PM  Karie Chimera, M.D., Ph.D. Diseases & Surgery of the Retina and Vitreous [facility] 09/20/2023   I have reviewed the above documentation for accuracy and completeness, and I agree with the above. Karie Chimera, M.D., Ph.D. 09/20/23 4:11 PM   Abbreviations: M myopia (nearsighted); A astigmatism; H hyperopia (farsighted); P presbyopia; Mrx spectacle prescription;  CTL contact lenses; OD right eye; OS left eye; OU both eyes  XT exotropia; ET esotropia; PEK punctate epithelial keratitis; PEE punctate epithelial erosions; DES dry eye syndrome; MGD meibomian gland dysfunction; ATs artificial tears; PFAT's preservative free artificial tears; NSC nuclear sclerotic cataract; PSC posterior subcapsular cataract; ERM epi-retinal membrane; PVD posterior vitreous detachment; RD retinal detachment; DM diabetes mellitus; DR diabetic retinopathy; NPDR non-proliferative diabetic retinopathy; PDR proliferative diabetic retinopathy; CSME clinically significant  macular edema; DME diabetic macular edema; dbh dot blot hemorrhages; CWS cotton wool spot; POAG primary open angle glaucoma; C/D cup-to-disc ratio; HVF humphrey visual field; GVF goldmann visual field; OCT optical coherence tomography; IOP intraocular pressure; BRVO Branch retinal vein occlusion; CRVO central retinal vein occlusion; CRAO central retinal artery occlusion; BRAO branch retinal artery occlusion; RT retinal tear; SB scleral buckle; PPV pars plana vitrectomy; VH Vitreous hemorrhage; PRP panretinal laser photocoagulation; IVK intravitreal kenalog; VMT vitreomacular traction; MH Macular hole;  NVD neovascularization of the disc; NVE neovascularization elsewhere; AREDS age related eye disease study; ARMD age related macular degeneration; POAG primary open angle glaucoma; EBMD epithelial/anterior basement membrane dystrophy; ACIOL anterior chamber intraocular lens; IOL intraocular lens; PCIOL posterior chamber intraocular lens; Phaco/IOL phacoemulsification with intraocular lens placement; PRK photorefractive keratectomy; LASIK laser assisted in situ keratomileusis; HTN hypertension; DM diabetes mellitus; COPD chronic obstructive pulmonary disease

## 2023-09-20 ENCOUNTER — Encounter (INDEPENDENT_AMBULATORY_CARE_PROVIDER_SITE_OTHER): Payer: Self-pay | Admitting: Ophthalmology

## 2023-09-20 ENCOUNTER — Ambulatory Visit (INDEPENDENT_AMBULATORY_CARE_PROVIDER_SITE_OTHER): Payer: Medicare Other | Admitting: Ophthalmology

## 2023-09-20 DIAGNOSIS — Z794 Long term (current) use of insulin: Secondary | ICD-10-CM | POA: Diagnosis not present

## 2023-09-20 DIAGNOSIS — I10 Essential (primary) hypertension: Secondary | ICD-10-CM

## 2023-09-20 DIAGNOSIS — Z961 Presence of intraocular lens: Secondary | ICD-10-CM

## 2023-09-20 DIAGNOSIS — H18523 Epithelial (juvenile) corneal dystrophy, bilateral: Secondary | ICD-10-CM

## 2023-09-20 DIAGNOSIS — H401130 Primary open-angle glaucoma, bilateral, stage unspecified: Secondary | ICD-10-CM

## 2023-09-20 DIAGNOSIS — H35033 Hypertensive retinopathy, bilateral: Secondary | ICD-10-CM

## 2023-09-20 DIAGNOSIS — Z8619 Personal history of other infectious and parasitic diseases: Secondary | ICD-10-CM

## 2023-09-20 DIAGNOSIS — Z7984 Long term (current) use of oral hypoglycemic drugs: Secondary | ICD-10-CM | POA: Diagnosis not present

## 2023-09-20 DIAGNOSIS — E113213 Type 2 diabetes mellitus with mild nonproliferative diabetic retinopathy with macular edema, bilateral: Secondary | ICD-10-CM

## 2023-09-20 MED ORDER — FARICIMAB-SVOA 6 MG/0.05ML IZ SOSY
6.0000 mg | PREFILLED_SYRINGE | INTRAVITREAL | Status: AC | PRN
  Administered 2023-09-20: 6 mg via INTRAVITREAL

## 2023-10-24 NOTE — Progress Notes (Signed)
 [facility] - Clinic Note  10/25/2023    CHIEF COMPLAINT Patient presents for Retina Follow Up  HISTORY OF PRESENT ILLNESS: Lucas Dudley is a 83 y.o. male who presents to the clinic today for:  HPI     Retina Follow Up   Patient presents with  Diabetic Retinopathy.  In both eyes.  This started 5 weeks ago.  Duration of 5 weeks.  Since onset it is stable.  I, the attending physician,  performed the HPI with the patient and updated documentation appropriately.        Comments   5 week retina follow up NPDR OU and IVV OU  pt is reporting no vision changes noticed he has some floaters denies any flashes       Last edited by Valdemar Rogue, MD on 10/25/2023 12:57 PM.     Referring physician: [name], MD [address] [place],  [place] [address]  HISTORICAL INFORMATION:   Selected notes from the MEDICAL RECORD NUMBER Referred by Dr. Lelon for DEE   CURRENT MEDICATIONS: Current Outpatient Medications (Ophthalmic Drugs)  Medication Sig   Artificial Tear Ointment (DRY EYES OP) Place 1 drop into both eyes daily as needed (Dry eye).   COMBIGAN  0.2-0.5 % ophthalmic solution Place 1 drop into both eyes 2 (two) times daily.   No current facility-administered medications for this visit. (Ophthalmic Drugs)   Current Outpatient Medications (Other)  Medication Sig   acetaminophen  (TYLENOL ) 650 MG CR tablet Take 1,300 mg by mouth every 8 (eight) hours as needed for pain.   amiodarone  (PACERONE ) 200 MG tablet TAKE 1 TABLET BY MOUTH EVERY DAY   Ascorbic Acid  (VITAMIN C) 100 MG tablet Take 100 mg by mouth daily.   aspirin  81 MG chewable tablet Chew 81 mg by mouth daily.   BERBERINE CHLORIDE PO Take by mouth daily.   camphor-menthol (ANTI-ITCH) lotion Apply 1 Application topically daily as needed for itching.   carvedilol  (COREG ) 3.125 MG tablet TAKE 1 TABLET BY MOUTH 2 TIMES DAILY.   CRANBERRY PO Take 1 tablet by mouth daily.   cyanocobalamin  (VITAMIN B12)  1000 MCG tablet Take 1 tablet (1,000 mcg total) by mouth daily.   ELDERBERRY PO Take 2 tablets by mouth daily. Chewable   ferrous sulfate  325 (65 FE) MG tablet Take 1 tablet (325 mg total) by mouth daily with breakfast.   fluticasone  (FLONASE ) 50 MCG/ACT nasal spray SPRAY 2 SPRAYS INTO EACH NOSTRIL EVERY DAY   gemfibrozil  (LOPID ) 600 MG tablet Take 600 mg by mouth at bedtime.    glimepiride  (AMARYL ) 2 MG tablet Take 1 tablet (2 mg total) by mouth 2 (two) times daily with a meal.   Insulin  Glargine (BASAGLAR  KWIKPEN) 100 UNIT/ML Inject 45 Units into the skin at bedtime.   lisinopril  (ZESTRIL ) 40 MG tablet Take 1 tablet (40 mg total) by mouth daily.   magnesium  gluconate (MAGONATE) 500 MG tablet Take 500 mg by mouth daily.   metFORMIN  (GLUCOPHAGE -XR) 500 MG 24 hr tablet Take 1,000 mg by mouth in the morning and at bedtime.   mineral oil liquid Take 15 mLs by mouth daily as needed for moderate constipation.   Multiple Vitamins-Minerals (MULTIVITAMIN WITH MINERALS) tablet Take 1 tablet by mouth daily. Centrum men 50+   Omega-3 1000 MG CAPS Take 1,000 mg by mouth daily.   OVER THE COUNTER MEDICATION Take 1 tablet by mouth daily. Beets Chew   pantoprazole  (PROTONIX ) 40 MG tablet TAKE 1 TABLET BY  MOUTH EVERY DAY   torsemide  (DEMADEX ) 20 MG tablet TAKE 1 TABLET BY MOUTH TWICE A DAY   triamcinolone  cream (KENALOG) 0.1 % Apply 1 Application topically 2 (two) times daily as needed (itch).   valACYclovir  (VALTREX ) 1000 MG tablet Take 1,000 mg by mouth at bedtime.   No current facility-administered medications for this visit. (Other)   REVIEW OF SYSTEMS: ROS   Positive for: Gastrointestinal, Endocrine, Cardiovascular, Eyes, Respiratory Negative for: Constitutional, Neurological, Skin, Genitourinary, Musculoskeletal, HENT, Psychiatric, Allergic/Imm, Heme/Lymph Last edited by Resa Delon ORN, COT on 10/25/2023 10:15 AM.     ALLERGIES Allergies  Allergen Reactions   Azithromycin  Nausea Only    Tape Rash and Other (See Comments)    Causes skin redness, Use paper tape only.   PAST MEDICAL HISTORY Past Medical History:  Diagnosis Date   Arthritis    Atrial fibrillation (HCC)    CAD (coronary artery disease)    a. Cath 03/17/15 showing 100% ostial D1, 50% prox LAD to mid LAD, 40% RPDA stenosis. Med rx. // Myoview  01/2020: EF 31 diffuse perfusion defect without reversibility (suspect artifact); reviewed with Dr. Suellen study felt to be low risk   Cataract    Mixed form OD   Chronic diastolic CHF (congestive heart failure) (HCC)    Diabetic retinopathy (HCC)    NPDR OU   Essential hypertension    Glaucoma    POAG OU   History of gout    Hyperlipidemia    Hypertensive retinopathy    OU   Kidney stones    Melanoma of neck (HCC)    NICM (nonischemic cardiomyopathy) (HCC)    OSA on CPAP 2012   Prostate cancer (HCC)    Type II diabetes mellitus (HCC)    Past Surgical History:  Procedure Laterality Date   ABDOMINAL HERNIA REPAIR     w/mesh   ATRIAL FIBRILLATION ABLATION N/A 06/06/2021   Procedure: ATRIAL FIBRILLATION ABLATION;  Surgeon: Kelsie Agent, MD;  Location: [facility] CV [facility];  Service: Cardiovascular;  Laterality: N/A;   BIOPSY  05/24/2022   Procedure: BIOPSY;  Surgeon: Shaaron Lamar HERO, MD;  [facility]: [facility];  Service: Endoscopy;;   BIOPSY  05/30/2022   Procedure: BIOPSY;  Surgeon: Shaaron Lamar HERO, MD;  [facility];  Service: Endoscopy;;   BIOPSY  01/10/2023   Procedure: BIOPSY;  Surgeon: Wilhelmenia Aloha Raddle., MD;  Location: [facility];  Service: Gastroenterology;;   CARDIAC CATHETERIZATION N/A 03/17/2015   Procedure: Left Heart Cath and Coronary Angiography;  Surgeon: Dorn JINNY Lesches, MD;  Location: [facility] INVASIVE CV LAB;  Service: Cardiovascular;  Laterality: N/A;   CARDIOVERSION N/A 08/09/2021   Procedure: CARDIOVERSION;  Surgeon: Raford Riggs, MD;  Location: [facility] ENDOSCOPY;  Service: Cardiovascular;  Laterality: N/A;   CARDIOVERSION  N/A 11/10/2021   Procedure: CARDIOVERSION;  Surgeon: Sheena Pugh, DO;  Location: [facility];  Service: Cardiovascular;  Laterality: N/A;   carotid doppler  09/17/2008   rigt and left ICAs 0-49%;mildly  abnormal   CATARACT EXTRACTION Left 2020   Dr. Lelon   COLONOSCOPY N/A 05/16/2017   Procedure: COLONOSCOPY;  Surgeon: Golda Claudis PENNER, MD;  [facility];  Service: Endoscopy;  Laterality: N/A;  930   COLONOSCOPY WITH PROPOFOL  N/A 05/24/2022   Procedure: COLONOSCOPY WITH PROPOFOL ;  Surgeon: Shaaron Lamar HERO, MD;  [facility];  Service: Endoscopy;  Laterality: N/A;   DOPPLER ECHOCARDIOGRAPHY  05/25/2009   EF 50-55%,LA mildly dilated, LV function normal   ELECTROPHYSIOLOGIC STUDY  N/A 04/05/2015   Procedure: Cardioversion;  Surgeon: Jerel Balding, MD;  Location: [facility];  Service: Cardiovascular;  Laterality: N/A;   ELECTROPHYSIOLOGIC STUDY N/A 09/06/2015   Procedure: Atrial Fibrillation Ablation;  Surgeon: [name], MD;  Location: [facility] INVASIVE CV LAB;  Service: Cardiovascular;  Laterality: N/A;   ELECTROPHYSIOLOGIC STUDY N/A 07/12/2016   redo afib ablation by Dr Rakers   ENTEROSCOPY N/A 05/30/2022   Procedure: ENTEROSCOPY;  Surgeon: Shaaron Lamar HERO, MD;  [facility];  Service: Endoscopy;  Laterality: N/A;   ESOPHAGOGASTRODUODENOSCOPY (EGD) WITH PROPOFOL  N/A 05/24/2022   Procedure: ESOPHAGOGASTRODUODENOSCOPY (EGD) WITH PROPOFOL ;  Surgeon: Shaaron Lamar HERO, MD;  [facility];  Service: Endoscopy;  Laterality: N/A;   ESOPHAGOGASTRODUODENOSCOPY (EGD) WITH PROPOFOL  N/A 01/10/2023   Procedure: ESOPHAGOGASTRODUODENOSCOPY (EGD) WITH PROPOFOL ;  Surgeon: Wilhelmenia Aloha Raddle., MD;  Location: [facility];  Service: Gastroenterology;  Laterality: N/A;   EUS N/A 01/10/2023   Procedure: UPPER ENDOSCOPIC ULTRASOUND (EUS) RADIAL;  Surgeon: Wilhelmenia Aloha Raddle., MD;  Location: [facility];  Service: Gastroenterology;  Laterality: N/A;   EXCISIONAL  HEMORRHOIDECTOMY     inside and out   EYE SURGERY Left 2020   Cat Sx - Dr. Lelon   FINE NEEDLE ASPIRATION Right    knee; drew ~ 1 quart off   GIVENS CAPSULE STUDY N/A 05/25/2022   Procedure: GIVENS CAPSULE STUDY;  Surgeon: Cindie Carlin POUR, DO;  [facility];  Service: Endoscopy;  Laterality: N/A;   GIVENS CAPSULE STUDY N/A 01/23/2023   Procedure: GIVENS CAPSULE STUDY;  Surgeon: Eartha Angelia Sieving, MD;  [facility];  Service: Gastroenterology;  Laterality: N/A;  8:30am   HERNIA REPAIR     HOT HEMOSTASIS N/A 01/10/2023   Procedure: HOT HEMOSTASIS (ARGON PLASMA COAGULATION/BICAP);  Surgeon: Wilhelmenia Aloha Raddle., MD;  Location: [facility];  Service: Gastroenterology;  Laterality: N/A;   LAPAROSCOPIC CHOLECYSTECTOMY     LEFT ATRIAL APPENDAGE OCCLUSION N/A 06/28/2022   Procedure: LEFT ATRIAL APPENDAGE OCCLUSION;  Surgeon: Wonda Sharper, MD;  Location: [facility] INVASIVE CV LAB;  Service: Cardiovascular;  Laterality: N/A;   MELANOMA EXCISION Right    neck   NM MYOCAR PERF WALL MOTION  02/21/2012   EF 61% ,EXERCISE 7 METS. exercise stopped due to wheezing and shortness of breathe   POLYPECTOMY  05/16/2017   Procedure: POLYPECTOMY;  Surgeon: Golda Claudis PENNER, MD;  [facility];  Service: Endoscopy;;  colon   POLYPECTOMY  05/24/2022   Procedure: POLYPECTOMY;  Surgeon: Shaaron Lamar HERO, MD;  [facility];  Service: Endoscopy;;   POLYPECTOMY  01/10/2023   Procedure: POLYPECTOMY;  Surgeon: Wilhelmenia Aloha Raddle., MD;  Location: [facility];  Service: Gastroenterology;;   PROSTATECTOMY     SHOULDER OPEN ROTATOR CUFF REPAIR Right X 2   SUBMUCOSAL TATTOO INJECTION  05/30/2022   Procedure: SUBMUCOSAL TATTOO INJECTION;  Surgeon: Shaaron Lamar HERO, MD;  [facility];  Service: Endoscopy;;   SUBMUCOSAL TATTOO INJECTION  01/10/2023   Procedure: SUBMUCOSAL TATTOO INJECTION;  Surgeon: Wilhelmenia Aloha Raddle., MD;  Location: [facility];  Service:  Gastroenterology;;   TEE WITHOUT CARDIOVERSION N/A 09/05/2015   Procedure: TRANSESOPHAGEAL ECHOCARDIOGRAM (TEE);  Surgeon: Wilbert JONELLE Bihari, MD;  Location: [facility] ENDOSCOPY;  Service: Cardiovascular;  Laterality: N/A;   TEE WITHOUT CARDIOVERSION N/A 06/28/2022   Procedure: TRANSESOPHAGEAL ECHOCARDIOGRAM (TEE);  Surgeon: Wonda Sharper, MD;  Location: [facility] INVASIVE CV LAB;  Service: Cardiovascular;  Laterality: N/A;   FAMILY HISTORY Family History  Problem  Relation Age of Onset   Heart disease Mother    Lung cancer Mother    Heart attack Mother 60   Diabetes Father    Heart disease Father    Stroke Brother    Healthy Daughter    Colon cancer Maternal Aunt        60s   SOCIAL HISTORY Social History   Tobacco Use   Smoking status: Former    Current packs/day: 0.00    Average packs/day: 2.0 packs/day for 27.0 years (54.0 ttl pk-yrs)    Types: Cigarettes    Start date: 40    Quit date: 1992    Years since quitting: 33.0    Passive exposure: Current   Smokeless tobacco: Never   Tobacco comments:    Former smoker 09/27/21  Vaping Use   Vaping status: Never Used  Substance Use Topics   Alcohol use: No    Alcohol/week: 0.0 standard drinks of alcohol    Comment: used to drink; stopped ~ 2008   Drug use: No       OPHTHALMIC EXAM: Base Eye Exam     Visual Acuity (Snellen - Linear)       Right Left   Dist cc 20/25 20/20 -1   Dist ph cc 20/20 -1     Correction: Glasses         Tonometry (Tonopen, 10:25 AM)       Right Left   Pressure 14 15         Pupils       Pupils Dark Light Shape React APD   Right PERRL 2 1 Round Brisk None   Left PERRL 2 1 Round Brisk None         Visual Fields       Left Right    Full Full         Extraocular Movement       Right Left    Full, Ortho Full, Ortho         Neuro/Psych     Oriented x3: Yes   Mood/Affect: Normal         Dilation     Both eyes: 2.5% Phenylephrine  @ 10:25 AM           Slit Lamp  and Fundus Exam     Slit Lamp Exam       Right Left   Lids/Lashes Dermato, mild MGD Dermato, mild MGD   Conjunctiva/Sclera Temporal pinguecula, mild inferior sub conj heme Temporal pinguecula   Cornea EBMD, mild haze, trace PEE, mild Debris in tear film, well healed cataract wound trace haze, trace PEE, well healed temporal cataract wounds, arcus   Anterior Chamber Deep and clear; narrow temporal angle Deep and clear   Iris Round and dilated, mild anterior bowing, No NVI Round and moderately dilated to 5.53mm   Lens PCIOL in good position, trace PCO PC IOL in good position with open PC   Anterior Vitreous Synerisis Synerisis         Fundus Exam       Right Left   Disc Mild pallor, sharp rim, +cupping, thin inferior rim Mild pallor, sharp rim, +cupping, +PPA, punctate heme at 0900 -- resolved   C/D Ratio 0.7 0.6   Macula Flat, blunted foveal reflex, cystic changes - slightly improved, RPE mottling and clumping, no heme, Drusen, punctate Exudates -- resolved Flat, blunted foveal reflex, no heme, cystic changes -- improved   Vessels attenuated, Tortuous attenuated, Tortuous   Periphery  Attached, rare MA, forcal DBH nasal to disc--improved Attached. No heme.           Refraction     Wearing Rx       Sphere Cylinder Axis Add   Right -0.25 +2.00 158 +2.50   Left -1.00 +1.25 008 +2.50           IMAGING AND PROCEDURES  Imaging and Procedures for 10/25/2023  OCT, Retina - OU - Both Eyes       Right Eye Quality was good. Central Foveal Thickness: 302. Progression has improved. Findings include no SRF, abnormal foveal contour, intraretinal fluid, vitreomacular adhesion (persistent IRF/IRHM -- slightly improved, partial PVD).   Left Eye Quality was good. Central Foveal Thickness: 300. Progression has been stable. Findings include normal foveal contour, no IRF, no SRF, intraretinal hyper-reflective material (Interval improvement in cystic changes and punctate IRHM IN fovea--  resolved; partial PVD).   Notes *Images captured and stored on drive  Diagnosis / Impression:  +DME OU OD: persistent IRF/IRHM -- slightly improved, partial PVD OS: Interval improvement  in cystic changes and punctate IRHM IN fovea-- resolved; partial PVD  Clinical management:  See below  Abbreviations: NFP - Normal foveal profile. CME - cystoid macular edema. PED - pigment epithelial detachment. IRF - intraretinal fluid. SRF - subretinal fluid. EZ - ellipsoid zone. ERM - epiretinal membrane. ORA - outer retinal atrophy. ORT - outer retinal tubulation. SRHM - subretinal hyper-reflective material. IRHM - intraretinal hyper-reflective material     Intravitreal Injection, Pharmacologic Agent - OD - Right Eye       Time Out 10/25/2023. 11:29 AM. Confirmed correct patient, procedure, site, and patient consented.   Anesthesia Topical anesthesia was used. Anesthetic medications included Lidocaine  2%, Proparacaine 0.5%.   Procedure Preparation included 5% betadine to ocular surface, eyelid speculum. A supplied (32g) needle was used.   Injection: 6 mg faricimab -svoa 6 MG/0.05ML   Route: Intravitreal, Site: Right Eye   NDC: 49757-903-98, Lot: [id number], Expiration date: 04/13/2025, Waste: 0 mL   Post-op Post injection exam found visual acuity of at least counting fingers. The patient tolerated the procedure well. There were no complications. The patient received written and verbal post procedure care education.   Notes **SAMPLE MEDICATION ADMINISTERED**            ASSESSMENT/PLAN:   ICD-10-CM   1. 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 Current  use of insulin  (HCC)  Z79.4     3. Long term (current) use of oral hypoglycemic drugs  Z79.84     4. Essential hypertension  I10     5. Hypertensive retinopathy of both eyes  H35.033     6. Pseudophakia of both eyes  Z96.1     7. 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 History of herpes zoster of eye  Z86.19     9. Primary open angle glaucoma of both eyes, unspecified glaucoma stage  H40.1130      1-3. 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 Single focal MA superior to fovea; OS: Perifoveal Mas w/ late leakage - s/p IVA OS #  1(07.26.21), #2 (08.24.21), #3 (09.21.21), #4 (10.22.21) -- IVA resistance ========================================================== - s/p IVE OD #1 (09.29.23), #2 (10.30.23), #3 (SAMPLE) (11.27.23) #4 (12.29.23) #5 (01.26.24) #6 (02.23.24) -- IVE resistance - s/p IVE OS #1 (11.22.21), #2 (12.21.21), #3 (01.20.22), #4 (02.18.22), #5 (03.18.22), #6 (04.15.22), #7 (05.10.22) -- sample, #8 (06.08.22), #9 (07.12.22), #10 (08.12.22), #11 (09.16.22), #12 (10.21.22), #13 (11.18.22), #14 (12.21.22), #15 (01.20.23), #16 (02.17.23), #17 (03.24.23), #18 (04.21.23), #19 (05.26.23), #20 (06.23.23) -- IVE resistance =========================================================== - s/p IVV OD #1 (03.22.24), #2 (04.19.24), #3 (05.17.24), #4 (06.18.24), #5 (07.19.24), #6 (08.23.24), #7 (09.27.24), #8 (11.01.24) #9(12.06.24) - s/p IVV OS #1 (07.21.23), #2 (08.25.23), #3 (09.29.23), #4 (10.30.23), #5 (11.27.23) #6 (12.29.23), #7 (01.26.23), #8 (02.23.24), #9 (03.22.24), #10 (04.19.24), #11 (05.17.24) #12 (12.06.24) - BCVA 20/20 OU - improved OU - OCT shows OD: persistent IRF/IRHM -- slightly improved at 5 wks, partial PVD; OS: Interval improvement  in cystic changes and punctate IRHM IN fovea-- resolved; partial PVD at 5 wks - recommend IVV OD #10 [sample] today, 01.10.24 with f/u in 5 weeks [[employer] funding  unavailable] - treating OS prn - pt wishes to proceed with injections OD  - RBA of procedure discussed, questions answered  - Vabysmo  informed consent obtained and signed, 03.22.24 (OD)  - Vabysmo  informed consent obtained and signed, 07.21.23 (OS) - Eylea  approved until 10/04/23 - [employer] approved Vabysmo , no good days for 2025 as of now - see procedure note - f/u 5 weeks -- DFE/OCT, possible injection(s)  4,5. Hypertensive retinopathy OU - discussed importance of tight BP control - continue to monitor  6. Pseudophakia OU  - s/p CE/IOL OS (2020, Dr. Lelon)             - s/p CE/IOL OD (2022, Dr. 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 Caresse in August 2022  - s/p SuperK OS on 12.12.2022  8. History of herpes zoster/iritis OS  - on Valtrex  1 g daily---maintenance  9. POAG OU  - was under the expert management of Dr. Lelon, now follows with Dr. Fleeta  - IOP 21,22  - continue Cosopt  bid OU  Ophthalmic Meds Ordered this visit:  Meds ordered this encounter  Medications   faricimab -svoa (VABYSMO ) 6mg /0.66mL intravitreal injection    This document serves as a record of services personally performed by Redell JUDITHANN Hans, MD, PhD. It was created on their behalf by Delon Newness COT, an ophthalmic technician. The creation of this record is the provider's dictation and/or activities during the visit.    Electronically signed by: Delon Newness COT 01.09.25 1:00 PM  This document serves as a record of services personally performed by Redell JUDITHANN Hans, MD, PhD. It was created on their behalf by Wanda GEANNIE Keens, COT an ophthalmic technician. The creation of this record is the provider's dictation and/or activities during the visit.    Electronically signed by:  Wanda GEANNIE Keens, COT  10/25/23 1:00 PM  Redell JUDITHANN Hans, M.D.,  Ph.D. Diseases & Surgery of the Retina and Vitreous [facility] 10/25/2023   I have reviewed the above documentation for accuracy and completeness, and I agree with the above. Redell JUDITHANN Hans, M.D., Ph.D. 10/25/23 1:09 PM   Abbreviations: M myopia (nearsighted); A astigmatism; H hyperopia (farsighted); P presbyopia; Mrx  spectacle prescription;  CTL contact lenses; OD right eye; OS left eye; OU both eyes  XT exotropia; ET esotropia; PEK punctate epithelial keratitis; PEE punctate epithelial erosions; DES dry eye syndrome; MGD meibomian gland dysfunction; ATs artificial tears; PFAT's preservative free artificial tears; NSC nuclear sclerotic cataract; PSC posterior subcapsular cataract; ERM epi-retinal membrane; PVD posterior vitreous detachment; RD retinal detachment; DM diabetes mellitus; DR diabetic retinopathy; NPDR non-proliferative diabetic retinopathy; PDR proliferative diabetic retinopathy; CSME clinically significant macular edema; DME diabetic macular edema; dbh dot blot hemorrhages; CWS cotton wool spot; POAG primary open angle glaucoma; C/D cup-to-disc ratio; HVF humphrey visual field; GVF goldmann visual field; OCT optical coherence tomography; IOP intraocular pressure; BRVO Branch retinal vein occlusion; CRVO central retinal vein occlusion; CRAO central retinal artery occlusion; BRAO branch retinal artery occlusion; RT retinal tear; SB scleral buckle; PPV pars plana vitrectomy; VH Vitreous hemorrhage; PRP panretinal laser photocoagulation; IVK intravitreal kenalog; VMT vitreomacular traction; MH Macular hole;  NVD neovascularization of the disc; NVE neovascularization elsewhere; AREDS age related eye disease study; ARMD age related macular degeneration; POAG primary open angle glaucoma; EBMD epithelial/anterior basement membrane dystrophy; ACIOL anterior chamber intraocular lens; IOL intraocular lens; PCIOL posterior chamber intraocular lens; Phaco/IOL phacoemulsification  with intraocular lens placement; PRK photorefractive keratectomy; LASIK laser assisted in situ keratomileusis; HTN hypertension; DM diabetes mellitus; COPD chronic obstructive pulmonary disease

## 2023-10-25 ENCOUNTER — Ambulatory Visit (INDEPENDENT_AMBULATORY_CARE_PROVIDER_SITE_OTHER): Payer: Medicare Other | Admitting: Ophthalmology

## 2023-10-25 ENCOUNTER — Encounter (INDEPENDENT_AMBULATORY_CARE_PROVIDER_SITE_OTHER): Payer: Self-pay | Admitting: Ophthalmology

## 2023-10-25 ENCOUNTER — Encounter (INDEPENDENT_AMBULATORY_CARE_PROVIDER_SITE_OTHER): Payer: Medicare Other | Admitting: Ophthalmology

## 2023-10-25 DIAGNOSIS — H18523 Epithelial (juvenile) corneal dystrophy, bilateral: Secondary | ICD-10-CM

## 2023-10-25 DIAGNOSIS — Z8619 Personal history of other infectious and parasitic diseases: Secondary | ICD-10-CM

## 2023-10-25 DIAGNOSIS — I10 Essential (primary) hypertension: Secondary | ICD-10-CM

## 2023-10-25 DIAGNOSIS — Z961 Presence of intraocular lens: Secondary | ICD-10-CM

## 2023-10-25 DIAGNOSIS — Z7984 Long term (current) use of oral hypoglycemic drugs: Secondary | ICD-10-CM

## 2023-10-25 DIAGNOSIS — E113213 Type 2 diabetes mellitus with mild nonproliferative diabetic retinopathy with macular edema, bilateral: Secondary | ICD-10-CM | POA: Diagnosis not present

## 2023-10-25 DIAGNOSIS — H35033 Hypertensive retinopathy, bilateral: Secondary | ICD-10-CM

## 2023-10-25 DIAGNOSIS — H401130 Primary open-angle glaucoma, bilateral, stage unspecified: Secondary | ICD-10-CM

## 2023-10-25 DIAGNOSIS — Z794 Long term (current) use of insulin: Secondary | ICD-10-CM | POA: Diagnosis not present

## 2023-10-25 MED ORDER — FARICIMAB-SVOA 6 MG/0.05ML IZ SOLN
6.0000 mg | INTRAVITREAL | Status: AC | PRN
  Administered 2023-10-25: 6 mg via INTRAVITREAL

## 2023-11-06 ENCOUNTER — Other Ambulatory Visit: Payer: Self-pay | Admitting: Cardiology

## 2023-11-08 ENCOUNTER — Other Ambulatory Visit: Payer: Self-pay

## 2023-11-08 ENCOUNTER — Emergency Department (HOSPITAL_COMMUNITY): Payer: Medicare Other

## 2023-11-08 ENCOUNTER — Emergency Department (HOSPITAL_COMMUNITY)
Admission: EM | Admit: 2023-11-08 | Discharge: 2023-11-08 | Disposition: A | Discharge status: Home / Self Care | Payer: Medicare Other | Attending: Emergency Medicine | Admitting: Emergency Medicine

## 2023-11-08 ENCOUNTER — Ambulatory Visit: Payer: Self-pay | Admitting: Internal Medicine

## 2023-11-08 ENCOUNTER — Encounter (HOSPITAL_COMMUNITY): Payer: Self-pay | Admitting: *Deleted

## 2023-11-08 DIAGNOSIS — Z7982 Long term (current) use of aspirin: Secondary | ICD-10-CM | POA: Diagnosis not present

## 2023-11-08 DIAGNOSIS — I2510 Atherosclerotic heart disease of native coronary artery without angina pectoris: Secondary | ICD-10-CM | POA: Insufficient documentation

## 2023-11-08 DIAGNOSIS — Z20822 Contact with and (suspected) exposure to covid-19: Secondary | ICD-10-CM | POA: Insufficient documentation

## 2023-11-08 DIAGNOSIS — R0602 Shortness of breath: Secondary | ICD-10-CM | POA: Diagnosis present

## 2023-11-08 DIAGNOSIS — Z794 Long term (current) use of insulin: Secondary | ICD-10-CM | POA: Insufficient documentation

## 2023-11-08 DIAGNOSIS — J40 Bronchitis, not specified as acute or chronic: Secondary | ICD-10-CM | POA: Insufficient documentation

## 2023-11-08 DIAGNOSIS — J9801 Acute bronchospasm: Secondary | ICD-10-CM | POA: Insufficient documentation

## 2023-11-08 LAB — TROPONIN I (HIGH SENSITIVITY)
Troponin I (High Sensitivity): 21 ng/L — ABNORMAL HIGH (ref <18)
Troponin I (High Sensitivity): 20 ng/L — ABNORMAL HIGH (ref <18)

## 2023-11-08 LAB — RESP PANEL BY RT-PCR (RSV, FLU A&B, COVID)  RVPGX2
Influenza A by PCR: NEGATIVE
Influenza B by PCR: NEGATIVE
Resp Syncytial Virus by PCR: NEGATIVE
SARS Coronavirus 2 by RT PCR: NEGATIVE

## 2023-11-08 LAB — CBC
HCT: 31.7 % — ABNORMAL LOW (ref 39.0–52.0)
Hemoglobin: 9.6 g/dL — ABNORMAL LOW (ref 13.0–17.0)
MCH: 28.2 pg (ref 26.0–34.0)
MCHC: 30.3 g/dL (ref 30.0–36.0)
MCV: 93 fL (ref 80.0–100.0)
Platelets: 316 10*3/uL (ref 150–400)
RBC: 3.41 MIL/uL — ABNORMAL LOW (ref 4.22–5.81)
RDW: 16.8 % — ABNORMAL HIGH (ref 11.5–15.5)
WBC: 7.4 10*3/uL (ref 4.0–10.5)
nRBC: 0 % (ref 0.0–0.2)

## 2023-11-08 LAB — BASIC METABOLIC PANEL
Anion gap: 14 (ref 5–15)
BUN: 28 mg/dL — ABNORMAL HIGH (ref 8–23)
CO2: 29 mmol/L (ref 22–32)
Calcium: 8.8 mg/dL — ABNORMAL LOW (ref 8.9–10.3)
Chloride: 97 mmol/L — ABNORMAL LOW (ref 98–111)
Creatinine, Ser: 1.29 mg/dL — ABNORMAL HIGH (ref 0.61–1.24)
GFR, Estimated: 56 mL/min — ABNORMAL LOW (ref >60)
Glucose, Bld: 203 mg/dL — ABNORMAL HIGH (ref 70–99)
Potassium: 4 mmol/L (ref 3.5–5.1)
Sodium: 140 mmol/L (ref 135–145)

## 2023-11-08 LAB — BRAIN NATRIURETIC PEPTIDE: B Natriuretic Peptide: 303 pg/mL — ABNORMAL HIGH (ref 0.0–100.0)

## 2023-11-08 MED ORDER — DOXYCYCLINE HYCLATE 100 MG PO CAPS: 100.0000 mg | ORAL_CAPSULE | Freq: Two times a day (BID) | ORAL | 0 refills | Status: DC

## 2023-11-08 MED ORDER — ALBUTEROL SULFATE HFA 108 (90 BASE) MCG/ACT IN AERS
2.0000 | INHALATION_SPRAY | Freq: Once | RESPIRATORY_TRACT | Status: AC
  Administered 2023-11-08: 2 via RESPIRATORY_TRACT
  Filled 2023-11-08: qty 6.7

## 2023-11-08 NOTE — Discharge Instructions (Addendum)
Follow-up with your doctor Monday as planned.  Take 1 extra fluid pill tomorrow, the Demadex or also called torsemide that you are taking.

## 2023-11-08 NOTE — ED Triage Notes (Signed)
Pt with SOB with exertion ongoing for a month per pt.  Denies any CP. States his HR is low, 54 this morning per pt. + foot swelling

## 2023-11-08 NOTE — ED Provider Triage Note (Signed)
Emergency Medicine Provider Triage Evaluation Note  EBRAHIM DEREMER , a 83 y.o. male  was evaluated in triage.  Pt complains of shortness of breath and cough.  Review of Systems  Positive: Short of breath Negative: fever  Physical Exam  BP (!) 133/58 (BP Location: Right Arm)   Pulse (!) 51   Temp 97.7 F (36.5 C) (Oral)   Resp 16   Ht 5' 10.5" (1.791 m)   Wt 113.4 kg   SpO2 100%   BMI 35.36 kg/m  Gen:   Awake, no distress   Resp:  Normal effort  MSK:   Moves extremities without difficulty  Other:    Medical Decision Making  Medically screening exam initiated at 2:08 PM.  Appropriate orders placed.  Lauralyn Primes was informed that the remainder of the evaluation will be completed by another provider, this initial triage assessment does not replace that evaluation, and the importance of remaining in the ED until their evaluation is complete.     Elson Areas, [place] 11/08/23 1408

## 2023-11-08 NOTE — Telephone Encounter (Signed)
Copied from CRM [id number]. Topic: Clinical - Red Word Triage >> Nov 08, 2023 10:15 AM Gildardo Pounds wrote: Red Word that prompted transfer to Nurse Triage: patient feels he has bronchitis or pneumonia; started a few days ago; trouble breathing when walking; coughing up lots of mucas that was yellow and turning brown.   Chief Complaint: Dyspnea Symptoms: Cough Frequency: Since Tuesday Pertinent Negatives: Patient denies chest pain.  Disposition: [x] ED /[] Urgent Care (no appt availability in office) / [] Appointment(In office/virtual)/ []  [facility]/ [] Home Care/ [] Refused Recommended Disposition /[] [facility]/ []  Follow-up with PCP Additional Notes: Lucas Dudley is a 83 year old male being triaged today for shortness of breath and a cough. History of heart failure, patient also reports a 3 lb weight gain overnight. Oxygen level reading of 90% with a pulse of 58. Referred patient to ER for possible exacerbation. Patient agreed to disposition.   Reason for Disposition  Oxygen level (e.g., pulse oximetry) 90 percent or lower  Answer Assessment - Initial Assessment Questions 1. RESPIRATORY STATUS: "Describe your breathing?" (e.g., wheezing, shortness of breath, unable to speak, severe coughing)      Shortness of Breath with exertion  2. ONSET: "When did this breathing problem begin?"      Tuesday   3. PATTERN "Does the difficult breathing come and go, or has it been constant since it started?"      Comes and Goes  4. SEVERITY: "How bad is your breathing?" (e.g., mild, moderate, severe)    - MILD: No SOB at rest, mild SOB with walking, speaks normally in sentences, can lie down, no retractions, pulse < 100.    - MODERATE: SOB at rest, SOB with minimal exertion and prefers to sit, cannot lie down flat, speaks in phrases, mild retractions, audible wheezing, pulse 100-120.    - SEVERE: Very SOB at rest, speaks in single words, struggling to breathe, sitting hunched forward, retractions,  pulse > 120      Mild  5. RECURRENT SYMPTOM: "Have you had difficulty breathing before?" If Yes, ask: "When was the last time?" and "What happened that time?"      Yes, and it was Pneumonia/COVID  6. CARDIAC HISTORY: "Do you have any history of heart disease?" (e.g., heart attack, angina, bypass surgery, angioplasty)      Heart Failure  7. LUNG HISTORY: "Do you have any history of lung disease?"  (e.g., pulmonary embolus, asthma, emphysema)     Asthma as a child.   8. CAUSE: "What do you think is causing the breathing problem?"      Unsure  9. OTHER SYMPTOMS: "Do you have any other symptoms? (e.g., dizziness, runny nose, cough, chest pain, fever)     Runny Nose, Cough  10. O2 SATURATION MONITOR:  "Do you use an oxygen saturation monitor (pulse oximeter) at home?" If Yes, ask: "What is your reading (oxygen level) today?" "What is your usual oxygen saturation reading?" (e.g., 95%)       Unsure  12. TRAVEL: "Have you traveled out of the country in the last month?" (e.g., travel history, exposures)       No  Protocols used: Breathing Difficulty-A-AH

## 2023-11-08 NOTE — ED Provider Notes (Signed)
[place] EMERGENCY DEPARTMENT AT [facility] Provider Note   CSN: [id number] Arrival date & time: 11/08/23  1249     History {Add pertinent medical, surgical, social history, OB history to HPI:1} Chief Complaint  Patient presents with   Shortness of Breath    Lucas Dudley is a 83 y.o. male.  Patient has a history of coronary artery disease.  Patient states he has been having a cough with yellow sputum production and some shortness of breath   Shortness of Breath      Home Medications Prior to Admission medications   Medication Sig Start Date End Date Taking? Authorizing Provider  doxycycline (VIBRAMYCIN) 100 MG capsule Take 1 capsule (100 mg total) by mouth 2 (two) times daily. One po bid x 7 days 11/08/23  Yes Bethann Berkshire, MD  acetaminophen (TYLENOL) 650 MG CR tablet Take 1,300 mg by mouth every 8 (eight) hours as needed for pain.    [provider]  amiodarone (PACERONE) 200 MG tablet TAKE 1 TABLET BY MOUTH EVERY DAY 07/04/23   Jonelle Sidle, MD  Artificial Tear Ointment (DRY EYES OP) Place 1 drop into both eyes daily as needed (Dry eye).    [provider]  Ascorbic Acid (VITAMIN C) 100 MG tablet Take 100 mg by mouth daily.    [provider]  aspirin 81 MG chewable tablet Chew 81 mg by mouth daily.    [provider]  BERBERINE CHLORIDE PO Take by mouth daily.    [provider]  camphor-menthol (ANTI-ITCH) lotion Apply 1 Application topically daily as needed for itching.    [provider]  carvedilol (COREG) 3.125 MG tablet TAKE 1 TABLET BY MOUTH TWICE A DAY 11/06/23   Jonelle Sidle, MD  COMBIGAN 0.2-0.5 % ophthalmic solution Place 1 drop into both eyes 2 (two) times daily. 10/16/21   [provider]  CRANBERRY PO Take 1 tablet by mouth daily.    [provider]  cyanocobalamin (VITAMIN B12) 1000 MCG tablet Take 1 tablet (1,000 mcg total) by mouth daily. 09/25/22   Carnella Guadalajara, PA-C  ELDERBERRY PO Take 2 tablets by mouth daily. Chewable    [provider]  ferrous sulfate 325 (65 FE) MG tablet Take 1 tablet (325 mg total) by mouth daily with breakfast. 09/25/22 05/28/23  Carnella Guadalajara, PA-C  fluticasone (FLONASE) 50 MCG/ACT nasal spray SPRAY 2 SPRAYS INTO EACH NOSTRIL EVERY DAY 08/12/23   Anabel Halon, MD  gemfibrozil (LOPID) 600 MG tablet Take 600 mg by mouth at bedtime.     [provider]  glimepiride (AMARYL) 2 MG tablet Take 1 tablet (2 mg total) by mouth 2 (two) times daily with a meal. 08/08/23   Allena Katz, Earlie Lou, MD  Insulin Glargine [facility]) 100 UNIT/ML Inject 45 Units into the skin at bedtime. 08/08/23   Anabel Halon, MD  lisinopril (ZESTRIL) 40 MG tablet Take 1 tablet (40 mg total) by mouth daily. 08/08/23   Anabel Halon, MD  magnesium gluconate (MAGONATE) 500 MG tablet Take 500 mg by mouth daily.    [provider]  metFORMIN (GLUCOPHAGE-XR) 500 MG 24 hr tablet Take 1,000 mg by mouth in the morning and at bedtime. 11/11/19   [provider]  mineral oil liquid Take 15 mLs by mouth daily as needed for moderate constipation.    [provider]  Multiple Vitamins-Minerals (MULTIVITAMIN WITH MINERALS) tablet Take 1 tablet by mouth daily. Centrum  men 50+    [provider]  Omega-3 1000 MG CAPS Take 1,000 mg by mouth daily.    [provider]  OVER THE COUNTER MEDICATION Take 1 tablet by mouth daily. Beets Chew    [provider]  pantoprazole (PROTONIX) 40 MG tablet TAKE 1 TABLET BY MOUTH EVERY DAY 07/08/23   Carlan, Chelsea L, NP  torsemide (DEMADEX) 20 MG tablet TAKE 1 TABLET BY MOUTH TWICE A DAY 07/22/23   Anabel Halon, MD  triamcinolone cream (KENALOG) 0.1 % Apply 1 Application topically 2 (two) times daily as needed (itch). 09/27/22   [provider]  valACYclovir (VALTREX) 1000 MG tablet Take 1,000 mg by mouth at bedtime. 02/12/19   [provider]      Allergies    Azithromycin and Tape    Review of Systems   Review of Systems  Respiratory:  Positive for shortness of breath.     Physical Exam Updated Vital Signs BP (!) 153/70   Pulse (!) 59   Temp 97.7 F (36.5 C) (Oral)   Resp 20   Ht 5' 10.5" (1.791 m)   Wt 113.4 kg   SpO2 96%   BMI 35.36 kg/m  Physical Exam  ED Results / Procedures / Treatments   Labs (all labs ordered are listed, but only abnormal results are displayed) Labs Reviewed  BASIC METABOLIC PANEL - Abnormal; Notable for the following components:      Result Value   Chloride 97 (*)    Glucose, Bld 203 (*)    BUN 28 (*)    Creatinine, Ser 1.29 (*)    Calcium 8.8 (*)    GFR, Estimated 56 (*)    All other components within normal limits  CBC - Abnormal; Notable for the following components:   RBC 3.41 (*)    Hemoglobin 9.6 (*)    HCT 31.7 (*)    RDW 16.8 (*)    All other components within normal limits  BRAIN NATRIURETIC PEPTIDE - Abnormal; Notable for the following components:   B Natriuretic Peptide 303.0 (*)    All other components within normal limits  TROPONIN I (HIGH SENSITIVITY) - Abnormal; Notable for the following components:   Troponin I (High Sensitivity) 21 (*)    All other components within normal limits  TROPONIN I (HIGH SENSITIVITY) - Abnormal; Notable for the following components:   Troponin I (High Sensitivity) 20 (*)    All other components within normal limits  RESP PANEL BY RT-PCR (RSV, FLU A&B, COVID)  [id number]    EKG EKG Interpretation Date/Time:  Friday November 08 2023 13:03:29 EST Ventricular Rate:  51 PR Interval:    QRS Duration:  106 QT Interval:  502 QTC Calculation: 462 R Axis:   -63  Text Interpretation: Sinus rhythm Incomplete right bundle branch block Left anterior fascicular block Artifact Abnormal ECG Confirmed by Gerhard Munch [phone]) on 11/08/2023 1:26:31 PM  Radiology DG Chest 2 View Result Date: 11/08/2023 CLINICAL DATA:   Shortness of breath. EXAM: CHEST - 2 VIEW COMPARISON:  06/28/2022. FINDINGS: Low lung volume. Bilateral lung fields are clear. Bilateral costophrenic angles are clear. Note is made of elevated right hemidiaphragm. Stable moderately enlarged cardio-mediastinal silhouette. No acute osseous abnormalities. The soft tissues are within normal limits. IMPRESSION: *No active cardiopulmonary disease. Electronically Signed   By: Jules Schick M.D.   On: 11/08/2023 14:16    Procedures Procedures  {Document cardiac monitor, telemetry assessment procedure when appropriate:1}  Medications Ordered in  ED Medications  albuterol (VENTOLIN HFA) 108 (90 Base) MCG/ACT inhaler 2 puff (2 puffs Inhalation Given 11/08/23 1725)    ED Course/ Medical Decision Making/ A&P   {   Click here for ABCD2, HEART and other calculatorsREFRESH Note before signing :1}                              Medical Decision Making Amount and/or Complexity of Data Reviewed Labs: ordered. Radiology: ordered.  Risk Prescription drug management.   Patient with bronchitis and bronchospasm.  He is put on doxycycline and albuterol will follow-up with PCP.  He will also take 1 extra Demadex tomorrow  {Document critical care time when appropriate:1} {Document review of labs and clinical decision tools ie heart score, Chads2Vasc2 etc:1}  {Document your independent review of radiology images, and any outside records:1} {Document your discussion with family members, caretakers, and with consultants:1} {Document social determinants of health affecting pt's care:1} {Document your decision making why or why not admission, treatments were needed:1} Final Clinical Impression(s) / ED Diagnoses Final diagnoses:  Bronchitis    Rx / DC Orders ED Discharge Orders          Ordered    doxycycline (VIBRAMYCIN) 100 MG capsule  2 times daily        11/08/23 1814

## 2023-11-11 ENCOUNTER — Encounter: Payer: Self-pay | Admitting: Internal Medicine

## 2023-11-11 ENCOUNTER — Ambulatory Visit: Payer: Medicare Other | Admitting: Internal Medicine

## 2023-11-11 VITALS — BP 133/67 | HR 61 | Ht 71.0 in | Wt 257.0 lb

## 2023-11-11 DIAGNOSIS — D500 Iron deficiency anemia secondary to blood loss (chronic): Secondary | ICD-10-CM

## 2023-11-11 DIAGNOSIS — I5042 Chronic combined systolic (congestive) and diastolic (congestive) heart failure: Secondary | ICD-10-CM | POA: Diagnosis not present

## 2023-11-11 DIAGNOSIS — I480 Paroxysmal atrial fibrillation: Secondary | ICD-10-CM | POA: Diagnosis not present

## 2023-11-11 DIAGNOSIS — E1169 Type 2 diabetes mellitus with other specified complication: Secondary | ICD-10-CM

## 2023-11-11 DIAGNOSIS — E113213 Type 2 diabetes mellitus with mild nonproliferative diabetic retinopathy with macular edema, bilateral: Secondary | ICD-10-CM

## 2023-11-11 DIAGNOSIS — I10 Essential (primary) hypertension: Secondary | ICD-10-CM | POA: Diagnosis not present

## 2023-11-11 DIAGNOSIS — Z794 Long term (current) use of insulin: Secondary | ICD-10-CM | POA: Diagnosis not present

## 2023-11-11 DIAGNOSIS — J209 Acute bronchitis, unspecified: Secondary | ICD-10-CM | POA: Insufficient documentation

## 2023-11-11 DIAGNOSIS — E1149 Type 2 diabetes mellitus with other diabetic neurological complication: Secondary | ICD-10-CM

## 2023-11-11 MED ORDER — AMOXICILLIN-POT CLAVULANATE 875-125 MG PO TABS: 1.0000 | ORAL_TABLET | Freq: Two times a day (BID) | ORAL | 0 refills | Status: DC

## 2023-11-11 NOTE — Progress Notes (Signed)
Established Patient Office Visit  Subjective:  Patient ID: Lucas Dudley, male    DOB: [birth year]  Age: 83 y.o. MRN: [id number]  CC:  Chief Complaint  Patient presents with   Care Management    3 month f/u, reports a visit to ED  due to sob, reports still having sob, is taking an antibiotic.    HPI Lucas Dudley is a 83 y.o. male with past medical history of HTN, CAD, HFrEF, A-fib s/p watchman device, type II DM with retinopathy, HLD, OSA, prostate ca. and morbid obesity who presents for f/u of his chronic medical conditions and recent ER visit.  He went to ER on 11/08/2023 with complaint of cough with yellowish sputum and intermittent dyspnea.  He was given doxycycline for bronchitis.  He did not notice any difference with it and does not want to continue despite counseling.  He also reports nasal congestion and sore throat today.  Denies fever or chills.  HTN, CAD, A Fib and CHF: His BP was WNL today. He takes lisinopril 40 mg QD, Coreg 3.125 mg BID and Torsemide 20 mg BID. He also takes Amiodarone 200 mg BID for A Fib. He has watchman device in place. Takes Aspirin for h/o CAD. Followed by Cardiology. He reports chronic leg swelling and orthopnea.  Type 2 DM: He takes Basaglar 45 U qHS, Metformin 1000 mg BID and Glimepiride 2 mg at bedtime. He has tried Ozempic in the past, but had supply issues. He denies trying Ozempic again. He checks blood glucose regularly in AM, which have been mostly around 120-150 with some blood glucose readings near 70s, but likely has hyperglycemia during the day. Denies any recent hypoglycemia. He had myalgias with statin, takes Gemfibrozil currently. He is followed by Retina specialist for diabetic retinopathy.  He uses CPAP regularly for OSA.  Iron deficiency anemia: He has history of GI bleeding due to Eliquis, leading to iron deficiency anemia.  He has had iron transfusions in the past.  He currently reports worsening of fatigue for the last 2 weeks.  Of  note, his Hb has dropped from 13.5 (06/24) to 11.2 (10/24).  He denies melena or hematochezia currently.  He takes half tablet of iron supplement as he has bloating and constipation with whole tablet.  He takes pantoprazole 40 mg QD for GERD.  He reports bloating and epigastric discomfort after eating.  He states that he has had pneumococcal vaccine - details unknown.  Past Medical History:  Diagnosis Date   Arthritis    Atrial fibrillation (HCC)    CAD (coronary artery disease)    a. Cath 03/17/15 showing 100% ostial D1, 50% prox LAD to mid LAD, 40% RPDA stenosis. Med rx. // Myoview 01/2020: EF 31 diffuse perfusion defect without reversibility (suspect artifact); reviewed with Dr. Tama High study felt to be low risk   Cataract    Mixed form OD   Chronic diastolic CHF (congestive heart failure) (HCC)    Diabetic retinopathy (HCC)    NPDR OU   Essential hypertension    Glaucoma    POAG OU   History of gout    Hyperlipidemia    Hypertensive retinopathy    OU   Kidney stones    Melanoma of neck (HCC)    NICM (nonischemic cardiomyopathy) (HCC)    OSA on CPAP 2012   Prostate cancer (HCC)    Type II diabetes mellitus (HCC)     Past Surgical History:  Procedure Laterality Date   ABDOMINAL  HERNIA REPAIR     w/mesh   ATRIAL FIBRILLATION ABLATION N/A 06/06/2021   Procedure: ATRIAL FIBRILLATION ABLATION;  Surgeon: Hillis Range, MD;  Location: [facility];  Service: Cardiovascular;  Laterality: N/A;   BIOPSY  05/24/2022   Procedure: BIOPSY;  Surgeon: Corbin Ade, MD;  Location: [facility];  Service: Endoscopy;;   BIOPSY  05/30/2022   Procedure: BIOPSY;  Surgeon: Corbin Ade, MD;  Location: [facility];  Service: Endoscopy;;   BIOPSY  01/10/2023   Procedure: BIOPSY;  Surgeon: Lemar Lofty., MD;  Location: [facility];  Service: Gastroenterology;;   CARDIAC CATHETERIZATION N/A 03/17/2015   Procedure: Left Heart Cath and Coronary Angiography;  Surgeon: Runell Gess, MD;  Location: [facility] INVASIVE CV LAB;  Service: Cardiovascular;  Laterality: N/A;   CARDIOVERSION N/A 08/09/2021   Procedure: CARDIOVERSION;  Surgeon: Chilton Si, MD;  Location: [facility] ENDOSCOPY;  Service: Cardiovascular;  Laterality: N/A;   CARDIOVERSION N/A 11/10/2021   Procedure: CARDIOVERSION;  Surgeon: Thomasene Ripple, DO;  Location: [facility]OSCOPY;  Service: Cardiovascular;  Laterality: N/A;   carotid doppler  09/17/2008   rigt and left ICAs 0-49%;mildly  abnormal   CATARACT EXTRACTION Left 2020   Dr. Alben Spittle   COLONOSCOPY N/A 05/16/2017   Procedure: COLONOSCOPY;  Surgeon: Malissa Hippo, MD;  Location: [facility];  Service: Endoscopy;  Laterality: N/A;  930   COLONOSCOPY WITH PROPOFOL N/A 05/24/2022   Procedure: COLONOSCOPY WITH PROPOFOL;  Surgeon: Corbin Ade, MD;  Location: [facility];  Service: Endoscopy;  Laterality: N/A;   DOPPLER ECHOCARDIOGRAPHY  05/25/2009   EF 50-55%,LA mildly dilated, LV function normal   ELECTROPHYSIOLOGIC STUDY N/A 04/05/2015   Procedure: Cardioversion;  Surgeon: Thurmon Fair, MD;  Location: [facility];  Service: Cardiovascular;  Laterality: N/A;   ELECTROPHYSIOLOGIC STUDY N/A 09/06/2015   Procedure: Atrial Fibrillation Ablation;  Surgeon: Hillis Range, MD;  Location: [facility] INVASIVE CV LAB;  Service: Cardiovascular;  Laterality: N/A;   ELECTROPHYSIOLOGIC STUDY N/A 07/12/2016   redo afib ablation by Dr Johney Frame   ENTEROSCOPY N/A 05/30/2022   Procedure: ENTEROSCOPY;  Surgeon: Corbin Ade, MD;  Location: [facility];  Service: Endoscopy;  Laterality: N/A;   ESOPHAGOGASTRODUODENOSCOPY (EGD) WITH PROPOFOL N/A 05/24/2022   Procedure: ESOPHAGOGASTRODUODENOSCOPY (EGD) WITH PROPOFOL;  Surgeon: Corbin Ade, MD;  [facility]: [facility];  Service: Endoscopy;  Laterality: N/A;   ESOPHAGOGASTRODUODENOSCOPY (EGD) WITH PROPOFOL N/A 01/10/2023   Procedure: ESOPHAGOGASTRODUODENOSCOPY (EGD) WITH PROPOFOL;  Surgeon: Meridee Score Netty Starring., MD;   Location: [facility] ENDOSCOPY;  Service: Gastroenterology;  Laterality: N/A;   EUS N/A 01/10/2023   Procedure: UPPER ENDOSCOPIC ULTRASOUND (EUS) RADIAL;  Surgeon: Lemar Lofty., MD;  Location: [facility] ENDOSCOPY;  Service: Gastroenterology;  Laterality: N/A;   EXCISIONAL HEMORRHOIDECTOMY     "inside and out"   EYE SURGERY Left 2020   Cat Sx - Dr. Alben Spittle   FINE NEEDLE ASPIRATION Right    knee; "drew ~ 1 quart off"   GIVENS CAPSULE STUDY N/A 05/25/2022   Procedure: GIVENS CAPSULE STUDY;  Surgeon: Lanelle Bal, DO;  Location: [facility];  Service: Endoscopy;  Laterality: N/A;   GIVENS CAPSULE STUDY N/A 01/23/2023   Procedure: GIVENS CAPSULE STUDY;  Surgeon: Dolores Frame, MD;  Location: [facility];  Service: Gastroenterology;  Laterality: N/A;  8:30am   HERNIA REPAIR     HOT HEMOSTASIS N/A 01/10/2023   Procedure: HOT HEMOSTASIS (ARGON PLASMA COAGULATION/BICAP);  Surgeon: Lemar Lofty.,  MD;  Location: [facility]OS[facility];  Service: Gastroenterology;  Laterality: N/A;   LAPAROSCOPIC CHOLECYSTECTOMY     LEFT ATRIAL APPENDAGE OCCLUSION N/A 06/28/2022   Procedure: LEFT ATRIAL APPENDAGE OCCLUSION;  Surgeon: Tonny Bollman, MD;  Location: [facility] INVASIVE CV LAB;  Service: Cardiovascular;  Laterality: N/A;   MELANOMA EXCISION Right    "neck"   NM MYOCAR PERF WALL MOTION  02/21/2012   EF 61% ,EXERCISE 7 METS. exercise stopped due to wheezing and shortness of breathe   POLYPECTOMY  05/16/2017   Procedure: POLYPECTOMY;  Surgeon: Malissa Hippo, MD;  [facility]: [facility];  Service: Endoscopy;;  colon   POLYPECTOMY  05/24/2022   Procedure: POLYPECTOMY;  Surgeon: Corbin Ade, MD;  [facility]: [facility];  Service: Endoscopy;;   POLYPECTOMY  01/10/2023   Procedure: POLYPECTOMY;  Surgeon: Meridee Score Netty Starring., MD;  Location: [facility];  Service: Gastroenterology;;   PROSTATECTOMY     SHOULDER OPEN ROTATOR CUFF REPAIR Right X 2   SUBMUCOSAL TATTOO INJECTION  05/30/2022    Procedure: SUBMUCOSAL TATTOO INJECTION;  Surgeon: Corbin Ade, MD;  Location: [facility];  Service: Endoscopy;;   SUBMUCOSAL TATTOO INJECTION  01/10/2023   Procedure: SUBMUCOSAL TATTOO INJECTION;  Surgeon: Lemar Lofty., MD;  Location: [facility];  Service: Gastroenterology;;   TEE WITHOUT CARDIOVERSION N/A 09/05/2015   Procedure: TRANSESOPHAGEAL ECHOCARDIOGRAM (TEE);  Surgeon: Quintella Reichert, MD;  Location: [facility] ENDOSCOPY;  Service: Cardiovascular;  Laterality: N/A;   TEE WITHOUT CARDIOVERSION N/A 06/28/2022   Procedure: TRANSESOPHAGEAL ECHOCARDIOGRAM (TEE);  Surgeon: Tonny Bollman, MD;  Location: [facility] INVASIVE CV LAB;  Service: Cardiovascular;  Laterality: N/A;    Family History  Problem Relation Age of Onset   Heart disease Mother    Lung cancer Mother    Heart attack Mother 83   Diabetes Father    Heart disease Father    Stroke Brother    Healthy Daughter    Colon cancer Maternal Aunt        21s    Social History   Socioeconomic History   Marital status: Widowed    Spouse name: Not on file   Number of children: 1   Years of education: Not on file   Highest education level: Not on file  Occupational History   Occupation: Retired  Tobacco Use   Smoking status: Former    Current packs/day: 0.00    Average packs/day: 2.0 packs/day for 27.0 years (54.0 ttl pk-yrs)    Types: Cigarettes    Start date: 60    Quit date: 1992    Years since quitting: 33.1    Passive exposure: Current   Smokeless tobacco: Never   Tobacco comments:    Former smoker 09/27/21  Vaping Use   Vaping status: Never Used  Substance and Sexual Activity   Alcohol use: No    Alcohol/week: 0.0 standard drinks of alcohol    Comment: "used to drink; stopped ~ 2008"   Drug use: No   Sexual activity: Not Currently  Other Topics Concern   Not on file  Social History Narrative   Lives in [place], [place] with wife.   Social Drivers of Corporate investment banker Strain: Not on file  Food  Insecurity: No Food Insecurity (09/11/2022)   Hunger Vital Sign    Worried About Running Out of Food in the Last Year: Never true    Ran Out of Food in the Last Year: Never true  Transportation Needs: No Transportation Needs (09/11/2022)  PRAPARE - Administrator, Civil Service (Medical): No    Lack of Transportation (Non-Medical): No  Physical Activity: Not on file  Stress: Not on file  Social Connections: Not on file  Intimate Partner Violence: Not At Risk (09/11/2022)   Humiliation, Afraid, Rape, and Kick questionnaire    Fear of Current or Ex-Partner: No    Emotionally Abused: No    Physically Abused: No    Sexually Abused: No    Outpatient Medications Prior to Visit  Medication Sig Dispense Refill   acetaminophen (TYLENOL) 650 MG CR tablet Take 1,300 mg by mouth every 8 (eight) hours as needed for pain.     amiodarone (PACERONE) 200 MG tablet TAKE 1 TABLET BY MOUTH EVERY DAY 90 tablet 1   Artificial Tear Ointment (DRY EYES OP) Place 1 drop into both eyes daily as needed (Dry eye).     Ascorbic Acid (VITAMIN C) 100 MG tablet Take 100 mg by mouth daily.     aspirin 81 MG chewable tablet Chew 81 mg by mouth daily.     BERBERINE CHLORIDE PO Take by mouth daily.     camphor-menthol (ANTI-ITCH) lotion Apply 1 Application topically daily as needed for itching.     carvedilol (COREG) 3.125 MG tablet TAKE 1 TABLET BY MOUTH TWICE A DAY 180 tablet 0   COMBIGAN 0.2-0.5 % ophthalmic solution Place 1 drop into both eyes 2 (two) times daily.     CRANBERRY PO Take 1 tablet by mouth daily.     cyanocobalamin (VITAMIN B12) 1000 MCG tablet Take 1 tablet (1,000 mcg total) by mouth daily.     ELDERBERRY PO Take 2 tablets by mouth daily. Chewable     fluticasone (FLONASE) 50 MCG/ACT nasal spray SPRAY 2 SPRAYS INTO EACH NOSTRIL EVERY DAY 48 mL 1   gemfibrozil (LOPID) 600 MG tablet Take 600 mg by mouth at bedtime.      glimepiride (AMARYL) 2 MG tablet Take 1 tablet (2 mg total) by mouth 2  (two) times daily with a meal. 60 tablet 3   Insulin Glargine (BASAGLAR KWIKPEN) 100 UNIT/ML Inject 45 Units into the skin at bedtime. 15 mL 3   lisinopril (ZESTRIL) 40 MG tablet Take 1 tablet (40 mg total) by mouth daily. 90 tablet 1   magnesium gluconate (MAGONATE) 500 MG tablet Take 500 mg by mouth daily.     metFORMIN (GLUCOPHAGE-XR) 500 MG 24 hr tablet Take 1,000 mg by mouth in the morning and at bedtime.     mineral oil liquid Take 15 mLs by mouth daily as needed for moderate constipation.     Multiple Vitamins-Minerals (MULTIVITAMIN WITH MINERALS) tablet Take 1 tablet by mouth daily. Centrum men 50+     Omega-3 1000 MG CAPS Take 1,000 mg by mouth daily.     OVER THE COUNTER MEDICATION Take 1 tablet by mouth daily. Beets Chew     pantoprazole (PROTONIX) 40 MG tablet TAKE 1 TABLET BY MOUTH EVERY DAY 90 tablet 1   torsemide (DEMADEX) 20 MG tablet TAKE 1 TABLET BY MOUTH TWICE A DAY 180 tablet 1   triamcinolone cream (KENALOG) 0.1 % Apply 1 Application topically 2 (two) times daily as needed (itch).     valACYclovir (VALTREX) 1000 MG tablet Take 1,000 mg by mouth at bedtime.     doxycycline (VIBRAMYCIN) 100 MG capsule Take 1 capsule (100 mg total) by mouth 2 (two) times daily. One po bid x 7 days 14 capsule 0   ferrous  sulfate 325 (65 FE) MG tablet Take 1 tablet (325 mg total) by mouth daily with breakfast. 120 tablet 1   No facility-administered medications prior to visit.    Allergies  Allergen Reactions   Azithromycin Nausea Only   Tape Rash and Other (See Comments)    Causes skin redness, Use paper tape only.    ROS Review of Systems  Constitutional:  Positive for fatigue. Negative for chills and fever.  HENT:  Positive for congestion. Negative for sore throat.   Eyes:  Negative for pain and discharge.  Respiratory:  Positive for cough and shortness of breath (Intermittent).   Cardiovascular:  Positive for leg swelling. Negative for chest pain and palpitations.   Gastrointestinal:  Negative for diarrhea, nausea and vomiting.  Endocrine: Negative for polydipsia and polyuria.  Genitourinary:  Negative for dysuria and hematuria.  Musculoskeletal:  Negative for neck pain and neck stiffness.  Skin:  Negative for rash.  Neurological:  Negative for dizziness and weakness.  Psychiatric/Behavioral:  Negative for agitation and behavioral problems.       Objective:    Physical Exam Vitals reviewed.  Constitutional:      General: He is not in acute distress.    Appearance: He is obese. He is not diaphoretic.  HENT:     Head: Normocephalic and atraumatic.     Nose: Congestion present.     Mouth/Throat:     Mouth: Mucous membranes are moist.  Eyes:     General: No scleral icterus.    Extraocular Movements: Extraocular movements intact.  Cardiovascular:     Rate and Rhythm: Normal rate and regular rhythm.     Heart sounds: Normal heart sounds. No murmur heard. Pulmonary:     Breath sounds: Normal breath sounds. No wheezing or rales.  Abdominal:     Palpations: Abdomen is soft.     Tenderness: There is no abdominal tenderness.  Musculoskeletal:     Cervical back: Neck supple. No tenderness.     Right lower leg: Edema (1+) present.     Left lower leg: Edema (1+) present.  Skin:    General: Skin is warm.     Findings: No rash.  Neurological:     General: No focal deficit present.     Mental Status: He is alert and oriented to person, place, and time.     Cranial Nerves: No cranial nerve deficit.     Sensory: No sensory deficit.     Motor: No weakness.  Psychiatric:        Mood and Affect: Mood normal.        Behavior: Behavior normal.     BP 133/67   Pulse 61   Ht 5\' 11"  (1.803 m)   Wt 257 lb (116.6 kg)   SpO2 91%   BMI 35.84 kg/m  Wt Readings from Last 3 Encounters:  11/11/23 257 lb (116.6 kg)  11/08/23 250 lb (113.4 kg)  08/23/23 245 lb (111.1 kg)    Lab Results  Component Value Date   TSH 3.037 05/21/2022   Lab Results   Component Value Date   WBC 7.6 11/11/2023   HGB 9.4 (L) 11/11/2023   HCT 30.4 (L) 11/11/2023   MCV 89 11/11/2023   PLT 322 11/11/2023   Lab Results  Component Value Date   NA 143 11/11/2023   K 4.2 11/11/2023   CO2 25 11/11/2023   GLUCOSE 92 11/11/2023   BUN 26 11/11/2023   CREATININE 1.31 (H) 11/11/2023   BILITOT  0.3 11/11/2023   ALKPHOS 62 11/11/2023   AST 19 11/11/2023   ALT 11 11/11/2023   PROT 7.0 11/11/2023   ALBUMIN 4.3 11/11/2023   CALCIUM 9.0 11/11/2023   ANIONGAP 14 11/08/2023   EGFR 55 (L) 11/11/2023   GFR 76.23 05/04/2015   Lab Results  Component Value Date   CHOL 147 07/22/2023   Lab Results  Component Value Date   HDL 24 (L) 07/22/2023   Lab Results  Component Value Date   LDLCALC 87 07/22/2023   Lab Results  Component Value Date   TRIG 213 (H) 07/22/2023   Lab Results  Component Value Date   CHOLHDL 6.1 (H) 07/22/2023   Lab Results  Component Value Date   HGBA1C 8.3 (H) 11/11/2023      Assessment & Plan:   Problem List Items Addressed This Visit       Cardiovascular and Mediastinum   Essential hypertension   BP Readings from Last 1 Encounters:  11/11/23 133/67   Well controlled with lisinopril 40 mg QD, Coreg 3.125 mg BID and Torsemide 20 mg BID If persistently elevated BP, can add Hydralazine Counseled for compliance with the medications Advised DASH diet and moderate exercise/walking, at least 150 mins/week      Paroxysmal atrial fibrillation (HCC)   Currently in sinus rhythm On Amiodarone 200 mg QD Rate controlled with Coreg 3.125 mg BID Has watchman device in place, had GI bleeding due to Eliquis      Chronic combined systolic and diastolic CHF (congestive heart failure) (HCC)   On Coreg and lisinopril Has chronic leg swelling and orthopnea On Torsemide 20 mg BID        Respiratory   Acute bronchitis   Recently went to ER, was given oral doxycycline Switched to Augmentin considering sinusitis symptoms Has  persistent cough - prescribed promethazine DM syrup as needed      Relevant Medications   amoxicillin-clavulanate (AUGMENTIN) 875-125 MG tablet     Endocrine   Type 2 diabetes mellitus with other specified complication (HCC) - Primary   Lab Results  Component Value Date   HGBA1C 9.9 (H) 07/22/2023   Uncontrolled, but improving Associated with diabetic retinopathy, HTN, CAD and HLD On Basaglar 45 U at bedtime, Metformin 1000 mg BID and Glimepiride 2 mg BID Recently increased dose of Basaglar to 45 units from 38 U at bedtime Hypoglycemia episodes in the morning likely due to late dose of glimepiride, he takes it at bedtime - advised to take it with supper Does not want to start GLP1 agonist Advised to follow diabetic diet On ACEi F/u CMP and lipid panel Diabetic eye exam: Advised to follow up with Ophthalmology for diabetic eye exam      Relevant Orders   CMP14+EGFR (Completed)   Hemoglobin A1c (Completed)   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, associated with type 2 diabetes mellitus (HCC)   Followed by Dr. Vanessa Barbara Needs to follow low-carb diet to improve glycemic profile      Diabetic neuropathy (HCC)   Has decreased sensation in certain areas of foot Needs toenail trimming as well - has thick toenails Referred to podiatry      Relevant Orders   Ambulatory referral to Podiatry     Other   Anemia due to chronic blood loss   Due to Eliquis in the past Has had iron transfusions Continue oral iron supplement Followed by Hematology - contacted Hematology NP due to recent drop in Hb to ~  9, may need iron transfusion again      Relevant Orders   CBC with Differential/Platelet (Completed)     Meds ordered this encounter  Medications   amoxicillin-clavulanate (AUGMENTIN) 875-125 MG tablet    Sig: Take 1 tablet by mouth 2 (two) times daily.    Dispense:  14 tablet    Refill:  0    Follow-up: Return in about 3 months  (around 02/09/2024) for DM.    Anabel Halon, MD

## 2023-11-11 NOTE — Assessment & Plan Note (Signed)
Currently in sinus rhythm On Amiodarone 200 mg QD Rate controlled with Coreg 3.125 mg BID Has watchman device in place, had GI bleeding due to Eliquis

## 2023-11-11 NOTE — Assessment & Plan Note (Addendum)
Recently went to ER, was given oral doxycycline Switched to Augmentin considering sinusitis symptoms Has persistent cough - prescribed promethazine DM syrup as needed

## 2023-11-11 NOTE — Assessment & Plan Note (Addendum)
Lab Results  Component Value Date   HGBA1C 9.9 (H) 07/22/2023   Uncontrolled, but improving Associated with diabetic retinopathy, HTN, CAD and HLD On Basaglar 45 U at bedtime, Metformin 1000 mg BID and Glimepiride 2 mg BID Recently increased dose of Basaglar to 45 units from 38 U at bedtime Hypoglycemia episodes in the morning likely due to late dose of glimepiride, he takes it at bedtime - advised to take it with supper Does not want to start GLP1 agonist Advised to follow diabetic diet On ACEi F/u CMP and lipid panel Diabetic eye exam: Advised to follow up with Ophthalmology for diabetic eye exam

## 2023-11-11 NOTE — Assessment & Plan Note (Signed)
On Coreg and lisinopril Has chronic leg swelling and orthopnea On Torsemide 20 mg BID

## 2023-11-11 NOTE — Patient Instructions (Addendum)
Please start taking Glimepiride with breakfast and before supper.  Please continue to take medications as prescribed.  Please continue to follow low carb diet and ambulate as tolerated.  Please take Robitussin as needed for cough.

## 2023-11-11 NOTE — Assessment & Plan Note (Signed)
BP Readings from Last 1 Encounters:  11/11/23 133/67   Well controlled with lisinopril 40 mg QD, Coreg 3.125 mg BID and Torsemide 20 mg BID If persistently elevated BP, can add Hydralazine Counseled for compliance with the medications Advised DASH diet and moderate exercise/walking, at least 150 mins/week

## 2023-11-12 LAB — CMP14+EGFR
ALT: 11 [IU]/L (ref 0–44)
AST: 19 [IU]/L (ref 0–40)
Albumin: 4.3 g/dL (ref 3.8–4.8)
Alkaline Phosphatase: 62 [IU]/L (ref 44–121)
BUN/Creatinine Ratio: 20 (ref 10–24)
BUN: 26 mg/dL (ref 8–27)
Bilirubin Total: 0.3 mg/dL (ref 0.0–1.2)
CO2: 25 mmol/L (ref 20–29)
Calcium: 9 mg/dL (ref 8.6–10.2)
Chloride: 99 mmol/L (ref 96–106)
Creatinine, Ser: 1.31 mg/dL — ABNORMAL HIGH (ref 0.76–1.27)
Globulin, Total: 2.7 g/dL (ref 1.5–4.5)
Glucose: 92 mg/dL (ref 70–99)
Potassium: 4.2 mmol/L (ref 3.5–5.2)
Sodium: 143 mmol/L (ref 134–144)
Total Protein: 7 g/dL (ref 6.0–8.5)
eGFR: 55 mL/min/{1.73_m2} — ABNORMAL LOW (ref >59)

## 2023-11-12 LAB — CBC WITH DIFFERENTIAL/PLATELET
Basophils Absolute: 0.1 10*3/uL (ref 0.0–0.2)
Basos: 1 %
EOS (ABSOLUTE): 0.2 10*3/uL (ref 0.0–0.4)
Eos: 3 %
Hematocrit: 30.4 % — ABNORMAL LOW (ref 37.5–51.0)
Hemoglobin: 9.4 g/dL — ABNORMAL LOW (ref 13.0–17.7)
Immature Grans (Abs): 0 10*3/uL (ref 0.0–0.1)
Immature Granulocytes: 0 %
Lymphocytes Absolute: 1.3 10*3/uL (ref 0.7–3.1)
Lymphs: 17 %
MCH: 27.6 pg (ref 26.6–33.0)
MCHC: 30.9 g/dL — ABNORMAL LOW (ref 31.5–35.7)
MCV: 89 fL (ref 79–97)
Monocytes Absolute: 0.6 10*3/uL (ref 0.1–0.9)
Monocytes: 8 %
Neutrophils Absolute: 5.4 10*3/uL (ref 1.4–7.0)
Neutrophils: 71 %
Platelets: 322 10*3/uL (ref 150–450)
RBC: 3.41 x10E6/uL — ABNORMAL LOW (ref 4.14–5.80)
RDW: 16.5 % — ABNORMAL HIGH (ref 11.6–15.4)
WBC: 7.6 10*3/uL (ref 3.4–10.8)

## 2023-11-12 LAB — HEMOGLOBIN A1C
Est. average glucose Bld gHb Est-mCnc: 192 mg/dL
Hgb A1c MFr Bld: 8.3 % — ABNORMAL HIGH (ref 4.8–5.6)

## 2023-11-15 DIAGNOSIS — E1140 Type 2 diabetes mellitus with diabetic neuropathy, unspecified: Secondary | ICD-10-CM | POA: Insufficient documentation

## 2023-11-15 NOTE — Assessment & Plan Note (Addendum)
Followed by Dr. Vanessa Barbara Needs to follow low-carb diet to improve glycemic profile

## 2023-11-15 NOTE — Assessment & Plan Note (Addendum)
Due to Eliquis in the past Has had iron transfusions Continue oral iron supplement Followed by Hematology - contacted Hematology NP due to recent drop in Hb to ~9, may need iron transfusion again

## 2023-11-15 NOTE — Assessment & Plan Note (Signed)
Has decreased sensation in certain areas of foot Needs toenail trimming as well - has thick toenails Referred to podiatry

## 2023-11-18 ENCOUNTER — Inpatient Hospital Stay: Payer: Medicare Other | Attending: Hematology

## 2023-11-18 DIAGNOSIS — Z79899 Other long term (current) drug therapy: Secondary | ICD-10-CM | POA: Insufficient documentation

## 2023-11-18 DIAGNOSIS — E538 Deficiency of other specified B group vitamins: Secondary | ICD-10-CM

## 2023-11-18 DIAGNOSIS — D509 Iron deficiency anemia, unspecified: Secondary | ICD-10-CM | POA: Diagnosis present

## 2023-11-18 DIAGNOSIS — D500 Iron deficiency anemia secondary to blood loss (chronic): Secondary | ICD-10-CM

## 2023-11-18 LAB — CBC WITH DIFFERENTIAL/PLATELET
Abs Immature Granulocytes: 0.04 10*3/uL (ref 0.00–0.07)
Basophils Absolute: 0 10*3/uL (ref 0.0–0.1)
Basophils Relative: 0 %
Eosinophils Absolute: 0.3 10*3/uL (ref 0.0–0.5)
Eosinophils Relative: 4 %
HCT: 31.1 % — ABNORMAL LOW (ref 39.0–52.0)
Hemoglobin: 9.3 g/dL — ABNORMAL LOW (ref 13.0–17.0)
Immature Granulocytes: 0 %
Lymphocytes Relative: 11 %
Lymphs Abs: 1 10*3/uL (ref 0.7–4.0)
MCH: 27.5 pg (ref 26.0–34.0)
MCHC: 29.9 g/dL — ABNORMAL LOW (ref 30.0–36.0)
MCV: 92 fL (ref 80.0–100.0)
Monocytes Absolute: 0.5 10*3/uL (ref 0.1–1.0)
Monocytes Relative: 6 %
Neutro Abs: 7.1 10*3/uL (ref 1.7–7.7)
Neutrophils Relative %: 79 %
Platelets: 335 10*3/uL (ref 150–400)
RBC: 3.38 MIL/uL — ABNORMAL LOW (ref 4.22–5.81)
RDW: 17.1 % — ABNORMAL HIGH (ref 11.5–15.5)
WBC: 9 10*3/uL (ref 4.0–10.5)
nRBC: 0 % (ref 0.0–0.2)

## 2023-11-18 LAB — COMPREHENSIVE METABOLIC PANEL
ALT: 15 U/L (ref 0–44)
AST: 20 U/L (ref 15–41)
Albumin: 3.6 g/dL (ref 3.5–5.0)
Alkaline Phosphatase: 45 U/L (ref 38–126)
Anion gap: 13 (ref 5–15)
BUN: 24 mg/dL — ABNORMAL HIGH (ref 8–23)
CO2: 27 mmol/L (ref 22–32)
Calcium: 8.5 mg/dL — ABNORMAL LOW (ref 8.9–10.3)
Chloride: 102 mmol/L (ref 98–111)
Creatinine, Ser: 1.45 mg/dL — ABNORMAL HIGH (ref 0.61–1.24)
GFR, Estimated: 49 mL/min — ABNORMAL LOW (ref >60)
Glucose, Bld: 86 mg/dL (ref 70–99)
Potassium: 3.9 mmol/L (ref 3.5–5.1)
Sodium: 142 mmol/L (ref 135–145)
Total Bilirubin: 0.5 mg/dL (ref 0.0–1.2)
Total Protein: 7.1 g/dL (ref 6.5–8.1)

## 2023-11-18 LAB — IRON AND TIBC
Iron: 21 ug/dL — ABNORMAL LOW (ref 45–182)
Saturation Ratios: 5 % — ABNORMAL LOW (ref 17.9–39.5)
TIBC: 462 ug/dL — ABNORMAL HIGH (ref 250–450)
UIBC: 441 ug/dL

## 2023-11-18 LAB — VITAMIN B12: Vitamin B-12: 2431 pg/mL — ABNORMAL HIGH (ref 180–914)

## 2023-11-18 LAB — FERRITIN: Ferritin: 12 ng/mL — ABNORMAL LOW (ref 24–336)

## 2023-11-19 ENCOUNTER — Telehealth: Payer: Self-pay | Admitting: *Deleted

## 2023-11-19 ENCOUNTER — Other Ambulatory Visit: Payer: Self-pay | Admitting: Physician Assistant

## 2023-11-19 DIAGNOSIS — D500 Iron deficiency anemia secondary to blood loss (chronic): Secondary | ICD-10-CM

## 2023-11-19 NOTE — Telephone Encounter (Signed)
Lucas Dudley, PAC has reviewed labs from yesterday. Worsening anemia likely due to iron deficiency. We will schedule patient for IV Feraheme x 3. Patient made aware and verbalized understanding.

## 2023-11-19 NOTE — Progress Notes (Addendum)
 Notified by patient's PCP due to recent drop in Hgb. Repeat labs from 11/18/2023 showed Hgb 9.3 (down from Hgb 11.2 in October 2024). Low ferritin at 12, with iron  saturation 5%, elevated TIBC 462, and low serum iron  21.  Kidney function at baseline.  We will schedule patient for IV Feraheme x 3. We will move his follow-up appointment (with same-day labs to include CBC, BB sample, iron  studies) back so that it is scheduled 4 weeks after last dose of IV iron . Patient has appointment with GI provider (NP Mitzie Boettcher) next week (11/28/2023) - advised to speak with her about any signs of blood loss.  Pleasant Lucas Barefoot, PA-C 11/19/23 1:08 PM

## 2023-11-20 ENCOUNTER — Ambulatory Visit: Payer: Medicare Other

## 2023-11-20 VITALS — Ht 70.5 in | Wt 254.0 lb

## 2023-11-20 DIAGNOSIS — Z0000 Encounter for general adult medical examination without abnormal findings: Secondary | ICD-10-CM | POA: Diagnosis not present

## 2023-11-20 LAB — METHYLMALONIC ACID, SERUM: Methylmalonic Acid, Quantitative: 156 nmol/L (ref 0–378)

## 2023-11-20 NOTE — Patient Instructions (Addendum)
 Mr. Harkin , Thank you for taking time to come for your Medicare Wellness Visit. I appreciate your ongoing commitment to your health goals. Please review the following plan we discussed and let me know if I can assist you in the future.   Referrals/Orders/Follow-Ups/Clinician Recommendations: Aim for 30 minutes of exercise or brisk walking, 6-8 glasses of water , and 5 servings of fruits and vegetables each day.   This is a list of the screening recommended for you and due dates:  Health Maintenance  Topic Date Due   Pneumonia Vaccine (1 of 2 - PCV) Never done   Zoster (Shingles) Vaccine (1 of 2) Never done   COVID-19 Vaccine (3 - 2024-25 season) 06/16/2023   Hemoglobin A1C  05/10/2024   Eye exam for diabetics  07/03/2024   Complete foot exam   07/17/2024   Yearly kidney health urinalysis for diabetes  07/21/2024   Yearly kidney function blood test for diabetes  11/17/2024   Medicare Annual Wellness Visit  11/19/2024   DTaP/Tdap/Td vaccine (2 - Td or Tdap) 10/18/2028   Flu Shot  Completed   HPV Vaccine  Aged Out    Advanced directives: (Provided) Advance directive discussed with you today. I have provided a copy for you to complete at home and have notarized. Once this is complete, please bring a copy in to our office so we can scan it into your chart.   Next Medicare Annual Wellness Visit scheduled for next year: Yes - 11/23/2024

## 2023-11-20 NOTE — Progress Notes (Signed)
 Subjective:   Lucas Dudley is a 83 y.o. male who presents for Medicare Annual/Subsequent preventive examination.  Visit Complete: Virtual I connected with  [name] on 11/20/23 by a audio enabled telemedicine application and verified that I am speaking with the correct person using two identifiers.  Patient Location: Home  Provider Location: Office/Clinic  I discussed the limitations of evaluation and management by telemedicine. The patient expressed understanding and agreed to proceed.  Vital Signs: Because this visit was a virtual/telehealth visit, some criteria may be missing or patient reported. Any vitals not documented were not able to be obtained and vitals that have been documented are patient reported.  Patient Medicare AWV questionnaire was completed by the patient on 11/11/23 (partial); I have confirmed that all information answered by patient is correct and no changes since this date.  Cardiac Risk Factors include: advanced age (>36men, >47 women);diabetes mellitus;hypertension;dyslipidemia;male gender;obesity (BMI >30kg/m2)     Objective:    Today's Vitals   11/20/23 1520 11/20/23 1521  Weight: 254 lb (115.2 kg)   Height: 5' 10.5 (1.791 m)   PainSc:  0-No pain   Body mass index is 35.93 kg/m.     11/20/2023    3:17 PM 11/08/2023   12:56 PM 08/28/2023    1:57 PM 08/20/2023    3:51 PM 08/13/2023    2:33 PM 04/11/2023    2:18 PM 04/04/2023    1:29 PM  Advanced Directives  Does Patient Have a Medical Advance Directive? No No Yes Yes Yes Yes Yes  Type of Surveyor, [place]erals;Living will Healthcare Power of [place];Living will Healthcare Power of [place];Living will Healthcare Power of [place];Living will Healthcare Power of [place];Living will  Does patient want to make changes to medical advance directive?    No - Patient declined   No - Patient declined  Copy of Healthcare Power of Attorney in Chart?   No - copy requested No -  copy requested No - copy requested  No - copy requested  Would patient like information on creating a medical advance directive? No - Patient declined  No - Patient declined No - Patient declined No - Patient declined  No - Patient declined    Current Medications (verified) Outpatient Encounter Medications as of 11/20/2023  Medication Sig   acetaminophen  (TYLENOL ) 650 MG CR tablet Take 1,300 mg by mouth every 8 (eight) hours as needed for pain.   amiodarone  (PACERONE ) 200 MG tablet TAKE 1 TABLET BY MOUTH EVERY DAY   amoxicillin -clavulanate (AUGMENTIN ) 875-125 MG tablet Take 1 tablet by mouth 2 (two) times daily.   Artificial Tear Ointment (DRY EYES OP) Place 1 drop into both eyes daily as needed (Dry eye).   Ascorbic Acid  (VITAMIN C) 100 MG tablet Take 100 mg by mouth daily.   aspirin  81 MG chewable tablet Chew 81 mg by mouth daily.   BERBERINE CHLORIDE PO Take by mouth daily.   camphor-menthol (ANTI-ITCH) lotion Apply 1 Application topically daily as needed for itching.   carvedilol  (COREG ) 3.125 MG tablet TAKE 1 TABLET BY MOUTH TWICE A DAY   COMBIGAN  0.2-0.5 % ophthalmic solution Place 1 drop into both eyes 2 (two) times daily.   CRANBERRY PO Take 1 tablet by mouth daily.   ELDERBERRY PO Take 2 tablets by mouth daily. Chewable   fluticasone  (FLONASE ) 50 MCG/ACT nasal spray SPRAY 2 SPRAYS INTO EACH NOSTRIL EVERY DAY   gemfibrozil  (LOPID ) 600 MG tablet Take 600 mg by mouth  at bedtime.    glimepiride  (AMARYL ) 2 MG tablet Take 1 tablet (2 mg total) by mouth 2 (two) times daily with a meal.   Insulin  Glargine (BASAGLAR  KWIKPEN) 100 UNIT/ML Inject 45 Units into the skin at bedtime.   lisinopril  (ZESTRIL ) 40 MG tablet Take 1 tablet (40 mg total) by mouth daily.   magnesium  gluconate (MAGONATE) 500 MG tablet Take 500 mg by mouth daily.   metFORMIN  (GLUCOPHAGE -XR) 500 MG 24 hr tablet Take 1,000 mg by mouth in the morning and at bedtime.   mineral oil liquid Take 15 mLs by mouth daily as needed for  moderate constipation.   Multiple Vitamins-Minerals (MULTIVITAMIN WITH MINERALS) tablet Take 1 tablet by mouth daily. Centrum men 50+   Omega-3 1000 MG CAPS Take 1,000 mg by mouth daily.   OVER THE COUNTER MEDICATION Take 1 tablet by mouth daily. Beets Chew   pantoprazole  (PROTONIX ) 40 MG tablet TAKE 1 TABLET BY MOUTH EVERY DAY   torsemide  (DEMADEX ) 20 MG tablet TAKE 1 TABLET BY MOUTH TWICE A DAY   triamcinolone  cream (KENALOG) 0.1 % Apply 1 Application topically 2 (two) times daily as needed (itch).   [DISCONTINUED] cyanocobalamin  (VITAMIN B12) 1000 MCG tablet Take 1 tablet (1,000 mcg total) by mouth daily.   [DISCONTINUED] valACYclovir  (VALTREX ) 1000 MG tablet Take 1,000 mg by mouth at bedtime.   ferrous sulfate  325 (65 FE) MG tablet Take 1 tablet (325 mg total) by mouth daily with breakfast.   No facility-administered encounter medications on file as of 11/20/2023.    Allergies (verified) Azithromycin  and Tape   History: Past Medical History:  Diagnosis Date   Arthritis    Atrial fibrillation (HCC)    CAD (coronary artery disease)    a. Cath 03/17/15 showing 100% ostial D1, 50% prox LAD to mid LAD, 40% RPDA stenosis. Med rx. // Myoview  01/2020: EF 31 diffuse perfusion defect without reversibility (suspect artifact); reviewed with Dr. Suellen study felt to be low risk   Cataract    Mixed form OD   Chronic diastolic CHF (congestive heart failure) (HCC)    Diabetic retinopathy (HCC)    NPDR OU   Essential hypertension    Glaucoma    POAG OU   History of gout    Hyperlipidemia    Hypertensive retinopathy    OU   Kidney stones    Melanoma of neck (HCC)    NICM (nonischemic cardiomyopathy) (HCC)    OSA on CPAP 2012   Prostate cancer (HCC)    Type II diabetes mellitus (HCC)    Past Surgical History:  Procedure Laterality Date   ABDOMINAL HERNIA REPAIR     w/mesh   ATRIAL FIBRILLATION ABLATION N/A 06/06/2021   Procedure: ATRIAL FIBRILLATION ABLATION;  Surgeon: Kelsie Agent, MD;  Location: [facility];  Service: Cardiovascular;  Laterality: N/A;   BIOPSY  05/24/2022   Procedure: BIOPSY;  Surgeon: Shaaron Lamar HERO, MD;  Location: [facility];  Service: Endoscopy;;   BIOPSY  05/30/2022   Procedure: BIOPSY;  Surgeon: Shaaron Lamar HERO, MD;  [facility];  Service: Endoscopy;;   BIOPSY  01/10/2023   Procedure: BIOPSY;  Surgeon: Wilhelmenia Aloha Raddle., MD;  Location: [facility];  Service: Gastroenterology;;   CARDIAC CATHETERIZATION N/A 03/17/2015   Procedure: Left Heart Cath and Coronary Angiography;  Surgeon: Dorn JINNY Lesches, MD;  Location: [facility] CV [facility];  Service: Cardiovascular;  Laterality: N/A;   CARDIOVERSION N/A 08/09/2021   Procedure: CARDIOVERSION;  Surgeon: Raford,  Annabella, MD;  Location: [facility];  Service: Cardiovascular;  Laterality: N/A;   CARDIOVERSION N/A 11/10/2021   Procedure: CARDIOVERSION;  Surgeon: Sheena Pugh, DO;  Location: [facility];  Service: Cardiovascular;  Laterality: N/A;   carotid doppler  09/17/2008   rigt and left ICAs 0-49%;mildly  abnormal   CATARACT EXTRACTION Left 2020   Dr. Lelon   COLONOSCOPY N/A 05/16/2017   Procedure: COLONOSCOPY;  Surgeon: Golda Claudis PENNER, MD;  [facility];  Service: Endoscopy;  Laterality: N/A;  930   COLONOSCOPY WITH PROPOFOL  N/A 05/24/2022   Procedure: COLONOSCOPY WITH PROPOFOL ;  Surgeon: Shaaron Lamar HERO, MD;  [facility];  Service: Endoscopy;  Laterality: N/A;   DOPPLER ECHOCARDIOGRAPHY  05/25/2009   EF 50-55%,LA mildly dilated, LV function normal   ELECTROPHYSIOLOGIC STUDY N/A 04/05/2015   Procedure: Cardioversion;  Surgeon: Jerel Balding, MD;  Location: [facility];  Service: Cardiovascular;  Laterality: N/A;   ELECTROPHYSIOLOGIC STUDY N/A 09/06/2015   Procedure: Atrial Fibrillation Ablation;  Surgeon: [name], MD;  Location: [facility] INVASIVE CV LAB;  Service: Cardiovascular;  Laterality: N/A;   ELECTROPHYSIOLOGIC STUDY N/A 07/12/2016    redo afib ablation by Dr Rakers   ENTEROSCOPY N/A 05/30/2022   Procedure: ENTEROSCOPY;  Surgeon: Shaaron Lamar HERO, MD;  [facility];  Service: Endoscopy;  Laterality: N/A;   ESOPHAGOGASTRODUODENOSCOPY (EGD) WITH PROPOFOL  N/A 05/24/2022   Procedure: ESOPHAGOGASTRODUODENOSCOPY (EGD) WITH PROPOFOL ;  Surgeon: Shaaron Lamar HERO, MD;  [facility];  Service: Endoscopy;  Laterality: N/A;   ESOPHAGOGASTRODUODENOSCOPY (EGD) WITH PROPOFOL  N/A 01/10/2023   Procedure: ESOPHAGOGASTRODUODENOSCOPY (EGD) WITH PROPOFOL ;  Surgeon: Wilhelmenia Aloha Raddle., MD;  Location: [facility];  Service: Gastroenterology;  Laterality: N/A;   EUS N/A 01/10/2023   Procedure: UPPER ENDOSCOPIC ULTRASOUND (EUS) RADIAL;  Surgeon: Wilhelmenia Aloha Raddle., MD;  Location: [facility];  Service: Gastroenterology;  Laterality: N/A;   EXCISIONAL HEMORRHOIDECTOMY     inside and out   EYE SURGERY Left 2020   Cat Sx - Dr. Lelon   FINE NEEDLE ASPIRATION Right    knee; drew ~ 1 quart off   GIVENS CAPSULE STUDY N/A 05/25/2022   Procedure: GIVENS CAPSULE STUDY;  Surgeon: Cindie Carlin POUR, DO;  [facility];  Service: Endoscopy;  Laterality: N/A;   GIVENS CAPSULE STUDY N/A 01/23/2023   Procedure: GIVENS CAPSULE STUDY;  Surgeon: Eartha Angelia Sieving, MD;  [facility];  Service: Gastroenterology;  Laterality: N/A;  8:30am   HERNIA REPAIR     HOT HEMOSTASIS N/A 01/10/2023   Procedure: HOT HEMOSTASIS (ARGON PLASMA COAGULATION/BICAP);  Surgeon: Wilhelmenia Aloha Raddle., MD;  Location: [facility];  Service: Gastroenterology;  Laterality: N/A;   LAPAROSCOPIC CHOLECYSTECTOMY     LEFT ATRIAL APPENDAGE OCCLUSION N/A 06/28/2022   Procedure: LEFT ATRIAL APPENDAGE OCCLUSION;  Surgeon: Wonda Sharper, MD;  Location: [facility] INVASIVE CV LAB;  Service: Cardiovascular;  Laterality: N/A;   MELANOMA EXCISION Right    neck   NM MYOCAR PERF WALL MOTION  02/21/2012   EF 61% ,EXERCISE 7 METS. exercise stopped  due to wheezing and shortness of breathe   POLYPECTOMY  05/16/2017   Procedure: POLYPECTOMY;  Surgeon: Golda Claudis PENNER, MD;  [facility];  Service: Endoscopy;;  colon   POLYPECTOMY  05/24/2022   Procedure: POLYPECTOMY;  Surgeon: Shaaron Lamar HERO, MD;  [facility];  Service: Endoscopy;;   POLYPECTOMY  01/10/2023   Procedure: POLYPECTOMY;  Surgeon: Wilhelmenia Aloha Raddle., MD;  Location: [facility];  Service: Gastroenterology;;   PROSTATECTOMY     SHOULDER OPEN ROTATOR CUFF REPAIR Right X 2   SUBMUCOSAL TATTOO INJECTION  05/30/2022   Procedure: SUBMUCOSAL TATTOO INJECTION;  Surgeon: Shaaron Lamar HERO, MD;  [facility];  Service: Endoscopy;;   SUBMUCOSAL TATTOO INJECTION  01/10/2023   Procedure: SUBMUCOSAL TATTOO INJECTION;  Surgeon: Wilhelmenia Aloha Raddle., MD;  Location: [facility];  Service: Gastroenterology;;   TEE WITHOUT CARDIOVERSION N/A 09/05/2015   Procedure: TRANSESOPHAGEAL ECHOCARDIOGRAM (TEE);  Surgeon: Wilbert JONELLE Bihari, MD;  Location: [facility];  Service: Cardiovascular;  Laterality: N/A;   TEE WITHOUT CARDIOVERSION N/A 06/28/2022   Procedure: TRANSESOPHAGEAL ECHOCARDIOGRAM (TEE);  Surgeon: Wonda Sharper, MD;  Location: [facility] INVASIVE CV LAB;  Service: Cardiovascular;  Laterality: N/A;   Family History  Problem Relation Age of Onset   Heart disease Mother    Lung cancer Mother    Heart attack Mother 25   Diabetes Father    Heart disease Father    Stroke Brother    Healthy Daughter    Colon cancer Maternal Aunt        27s   Social History   Socioeconomic History   Marital status: Widowed    Spouse name: Not on file   Number of children: 1   Years of education: Not on file   Highest education level: Not on file  Occupational History   Occupation: Retired  Tobacco Use   Smoking status: Former    Current packs/day: 0.00    Average packs/day: 2.0 packs/day for 27.0 years (54.0 ttl pk-yrs)    Types: Cigarettes    Start date: 42     Quit date: 1992    Years since quitting: 33.1    Passive exposure: Current   Smokeless tobacco: Never   Tobacco comments:    Former smoker 09/27/21  Vaping Use   Vaping status: Never Used  Substance and Sexual Activity   Alcohol use: No    Alcohol/week: 0.0 standard drinks of alcohol    Comment: used to drink; stopped ~ 2008   Drug use: No   Sexual activity: Not Currently  Other Topics Concern   Not on file  Social History Narrative   Lives in [place], [place] with wife.   Social Drivers of Corporate Investment Banker Strain: Low Risk  (11/20/2023)   Overall Financial Resource Strain (CARDIA)    Difficulty of Paying Living Expenses: Not hard at all  Food Insecurity: No Food Insecurity (11/20/2023)   Hunger Vital Sign    Worried About Running Out of Food in the Last Year: Never true    Ran Out of Food in the Last Year: Never true  Transportation Needs: No Transportation Needs (11/20/2023)   PRAPARE - Administrator, Civil Service (Medical): No    Lack of Transportation (Non-Medical): No  Physical Activity: Inactive (11/20/2023)   Exercise Vital Sign    Days of Exercise per Week: 0 days    Minutes of Exercise per Session: 0 min  Stress: No Stress Concern Present (11/20/2023)   [employer] of Occupational Health - Occupational Stress Questionnaire    Feeling of Stress : Not at all  Social Connections: Moderately Isolated (11/20/2023)   Social Connection and Isolation Panel [NHANES]    Frequency of Communication with Friends and Family: More than three times a week    Frequency of Social Gatherings with Friends and Family: Once a week    Attends Religious Services: More than 4 times per  year    Active Member of Clubs or Organizations: No    Attends Banker Meetings: Never    Marital Status: Widowed    Tobacco Counseling Counseling given: Not Answered Tobacco comments: Former smoker 09/27/21   Clinical Intake:  Pre-visit preparation completed:  Yes  Pain : No/denies pain Pain Score: 0-No pain     BMI - recorded: 35.93 Nutritional Status: BMI > 30  Obese Diabetes: Yes CBG done?: Yes (fasting Blood sugar reading today - 109) CBG resulted in Enter/ Edit results?: Yes (109 - 11/20/23) Did pt. bring in CBG monitor from home?: No  How often do you need to have someone help you when you read instructions, pamphlets, or other written materials from your doctor or pharmacy?: 1 - Never  Interpreter Needed?: No  Information entered by :: Verdie Saba, CMA   Activities of Daily Living    11/20/2023    3:27 PM  In your present state of health, do you have any difficulty performing the following activities:  Hearing? 0  Vision? 0  Difficulty concentrating or making decisions? 0  Walking or climbing stairs? 0  Dressing or bathing? 0  Doing errands, shopping? 0  Preparing Food and eating ? N  Using the Toilet? N  In the past six months, have you accidently leaked urine? N  Do you have problems with loss of bowel control? N  Managing your Medications? N  Managing your Finances? N  Housekeeping or managing your Housekeeping? N    Patient Care Team: [name], MD as PCP - General (Internal Medicine) Kelsie Agent, MD (Inactive) as PCP - Electrophysiology (Cardiology) Debera Jayson MATSU, MD as PCP - Cardiology (Cardiology)  Indicate any recent Medical Services you may have received from other than [facility] providers in the past year (date may be approximate).     Assessment:   This is a routine wellness examination for [place].  Hearing/Vision screen Hearing Screening - Comments:: Denies hearing difficulties   Vision Screening - Comments:: Wears eyeglasses    Goals Addressed               This Visit's Progress     Patient Stated (pt-stated)        Patient stated that he's wanting to continue to be active        Depression Screen    11/20/2023    3:29 PM 11/11/2023    3:44 PM 08/23/2023    2:20 PM 08/08/2023     3:26 PM 07/18/2023    1:07 PM 09/11/2022    1:31 PM  PHQ 2/9 Scores  PHQ - 2 Score 2 0 1 3 0 0  PHQ- 9 Score 2 0 2 10    Exception Documentation --         Fall Risk    11/20/2023    3:33 PM 11/11/2023    3:44 PM 08/23/2023    2:20 PM 08/08/2023    3:26 PM 07/18/2023    1:04 PM  Fall Risk   Falls in the past year? 1 0 1 1 1   Number falls in past yr: 0 0 0 0 0  Injury with Fall? 0 0 0 0 0  Risk for fall due to : History of fall(s);Impaired balance/gait No Fall Risks Impaired balance/gait No Fall Risks   Follow up Falls prevention discussed;Falls evaluation completed Falls evaluation completed Falls evaluation completed Falls evaluation completed     MEDICARE RISK AT HOME: Medicare Risk at Home Any stairs  in or around the home?: Yes If so, are there any without handrails?: No Home free of loose throw rugs in walkways, pet beds, electrical cords, etc?: Yes Adequate lighting in your home to reduce risk of falls?: Yes Life alert?: No Use of a cane, walker or w/c?: No Grab bars in the bathroom?: Yes Shower chair or bench in shower?: No Elevated toilet seat or a handicapped toilet?: Yes  TIMED UP AND GO:  Was the test performed?  No    Cognitive Function:        11/20/2023    3:35 PM  6CIT Screen  What Year? 0 points  What month? 0 points  What time? 0 points  Count back from 20 0 points  Months in reverse 0 points  Repeat phrase 2 points  Total Score 2 points    Immunizations Immunization History  Administered Date(s) Administered   Fluad Trivalent(High Dose 65+) 07/18/2023   Influenza,inj,Quad PF,6+ Mos 07/13/2016   Janssen (J&J) SARS-COV-2 Vaccination 01/19/2020   Moderna Sars-Covid-2 Vaccination 09/26/2020   Tdap 10/18/2018    TDAP status: Up to date - 10/18/2018  Flu Vaccine status: Up to date - 07/18/23  Pneumococcal vaccine status: Declined,  Education has been provided regarding the importance of this vaccine but patient still declined. Advised may receive  this vaccine at local pharmacy or Health Dept. Aware to provide a copy of the vaccination record if obtained from local pharmacy or Health Dept. Verbalized acceptance and understanding.   Covid-19 vaccine status: Declined, Education has been provided regarding the importance of this vaccine but patient still declined. Advised may receive this vaccine at local pharmacy or Health Dept.or vaccine clinic. Aware to provide a copy of the vaccination record if obtained from local pharmacy or Health Dept. Verbalized acceptance and understanding.  Qualifies for Shingles Vaccine? Yes   Zostavax completed No   Shingrix Completed?: No.    Education has been provided regarding the importance of this vaccine. Patient has been advised to call insurance company to determine out of pocket expense if they have not yet received this vaccine. Advised may also receive vaccine at local pharmacy or Health Dept. Verbalized acceptance and understanding.  Screening Tests Health Maintenance  Topic Date Due   Pneumonia Vaccine 70+ Years old (1 of 2 - PCV) Never done   Zoster Vaccines- Shingrix (1 of 2) Never done   COVID-19 Vaccine (3 - 2024-25 season) 06/16/2023   HEMOGLOBIN A1C  05/10/2024   OPHTHALMOLOGY EXAM  07/03/2024   FOOT EXAM  07/17/2024   Diabetic kidney evaluation - Urine ACR  07/21/2024   Diabetic kidney evaluation - eGFR measurement  11/17/2024   Medicare Annual Wellness (AWV)  11/19/2024   DTaP/Tdap/Td (2 - Td or Tdap) 10/18/2028   INFLUENZA VACCINE  Completed   HPV VACCINES  Aged Out    Health Maintenance  Health Maintenance Due  Topic Date Due   Pneumonia Vaccine 68+ Years old (1 of 2 - PCV) Never done   Zoster Vaccines- Shingrix (1 of 2) Never done   COVID-19 Vaccine (3 - 2024-25 season) 06/16/2023    Colorectal cancer screening: No longer required.   Additional Screening:  Hepatitis C Screening: does not qualify  Vision Screening: Recommended annual ophthalmology exams for early  detection of glaucoma and other disorders of the eye. Is the patient up to date with their annual eye exam?  Yes  Who is the provider or what is the name of the office in which the patient attends  annual eye exams? Dr Valdemar If pt is not established with a provider, would they like to be referred to a provider to establish care? No .   Dental Screening: Recommended annual dental exams for proper oral hygiene  Diabetic Foot Exam: Diabetic Foot Exam: Completed 11/11/23  Community Resource Referral / Chronic Care Management: CRR required this visit?  No   CCM required this visit?  No     Plan:     I have personally reviewed and noted the following in the patient's chart:   Medical and social history Use of alcohol, tobacco or illicit drugs  Current medications and supplements including opioid prescriptions. Patient is not currently taking opioid prescriptions. Functional ability and status Nutritional status Physical activity Advanced directives List of other physicians Hospitalizations, surgeries, and ER visits in previous 12 months Vitals Screenings to include cognitive, depression, and falls Referrals and appointments  In addition, I have reviewed and discussed with patient certain preventive protocols, quality metrics, and best practice recommendations. A written personalized care plan for preventive services as well as general preventive health recommendations were provided to patient.     Verdie CHRISTELLA Saba, CMA   11/20/2023   After Visit Summary: (MyChart) Due to this being a telephonic visit, the after visit summary with patients personalized plan was offered to patient via MyChart   Nurse Notes: none

## 2023-11-21 ENCOUNTER — Inpatient Hospital Stay: Payer: Medicare Other

## 2023-11-21 VITALS — BP 142/61 | HR 52 | Temp 97.0°F | Resp 19

## 2023-11-21 DIAGNOSIS — D509 Iron deficiency anemia, unspecified: Secondary | ICD-10-CM | POA: Diagnosis not present

## 2023-11-21 DIAGNOSIS — D500 Iron deficiency anemia secondary to blood loss (chronic): Secondary | ICD-10-CM

## 2023-11-21 MED ORDER — SODIUM CHLORIDE 0.9 % IV SOLN: INTRAVENOUS | Status: DC

## 2023-11-21 MED ORDER — ACETAMINOPHEN 325 MG PO TABS
650.0000 mg | ORAL_TABLET | Freq: Once | ORAL | Status: DC
Start: 2023-11-21 — End: 2023-11-21

## 2023-11-21 MED ORDER — SODIUM CHLORIDE 0.9 % IV SOLN
510.0000 mg | Freq: Once | INTRAVENOUS | Status: AC
  Administered 2023-11-21: 510 mg via INTRAVENOUS
  Filled 2023-11-21: qty 510

## 2023-11-21 MED ORDER — CETIRIZINE HCL 10 MG/ML IV SOLN: 5.0000 mg | Freq: Once | INTRAVENOUS | Status: DC

## 2023-11-21 NOTE — Progress Notes (Signed)
 Patient tolerated iron  infusion with no complaints voiced.  Peripheral IV site clean and dry with good blood return noted before and after infusion.  Band aid applied.  Pt observed for 30 minutes post iron  infusion without any complications. VSS with discharge and left in satisfactory condition with no s/s of distress noted. Pt escorted out via wheelchair by nurse tech to be discharged home. All follow ups as scheduled.   Toni Hoffmeister

## 2023-11-21 NOTE — Patient Instructions (Signed)

## 2023-11-22 ENCOUNTER — Ambulatory Visit: Payer: Medicare Other | Admitting: Podiatry

## 2023-11-22 ENCOUNTER — Encounter: Payer: Self-pay | Admitting: Podiatry

## 2023-11-22 VITALS — Ht 70.0 in

## 2023-11-22 DIAGNOSIS — M79675 Pain in left toe(s): Secondary | ICD-10-CM

## 2023-11-22 DIAGNOSIS — M79674 Pain in right toe(s): Secondary | ICD-10-CM

## 2023-11-22 DIAGNOSIS — B351 Tinea unguium: Secondary | ICD-10-CM | POA: Diagnosis not present

## 2023-11-22 MED ORDER — AMMONIUM LACTATE 12 % EX LOTN: 1.0000 | TOPICAL_LOTION | CUTANEOUS | 0 refills | Status: AC | PRN

## 2023-11-22 NOTE — Progress Notes (Signed)
  Subjective:  Patient ID: Lucas Dudley, male    DOB: July 11, 1941,  MRN: [id number]  Chief Complaint  Patient presents with   DFc    He is here to establish for a nail trim    83 y.o. male returns for the above complaint.  Patient presents with thickened elongated dystrophic mycotic toenails x 10 with ambulation hurts with pressure pain scale 7 out of 10 dull aching nature.  Objective:  There were no vitals filed for this visit. Podiatric Exam: Vascular: dorsalis pedis and posterior tibial pulses are palpable bilateral. Capillary return is immediate. Temperature gradient is WNL. Skin turgor WNL  Sensorium: Normal Semmes Weinstein monofilament test. Normal tactile sensation bilaterally. Nail Exam: Pt has thick disfigured discolored nails with subungual debris noted bilateral entire nail hallux through fifth toenails.  Pain on palpation to the nails. Ulcer Exam: There is no evidence of ulcer or pre-ulcerative changes or infection. Orthopedic Exam: Muscle tone and strength are WNL. No limitations in general ROM. No crepitus or effusions noted.  Skin: No Porokeratosis. No infection or ulcers    Assessment & Plan:   1. Pain due to onychomycosis of toenails of both feet     Patient was evaluated and treated and all questions answered.  Onychomycosis with pain  -Nails palliatively debrided as below. -Educated on self-care  Procedure: Nail Debridement Rationale: pain  Type of Debridement: manual, sharp debridement. Instrumentation: Nail nipper, rotary burr. Number of Nails: 10  Procedures and Treatment: Consent by patient was obtained for treatment procedures. The patient understood the discussion of treatment and procedures well. All questions were answered thoroughly reviewed. Debridement of mycotic and hypertrophic toenails, 1 through 5 bilateral and clearing of subungual debris. No ulceration, no infection noted.  Return Visit-Office Procedure: Patient instructed to return to the  office for a follow up visit 3 months for continued evaluation and treatment.  Franky Blanch, DPM    Return in about 3 months (around 02/19/2024) for Routine Foot Care.

## 2023-11-28 ENCOUNTER — Ambulatory Visit (INDEPENDENT_AMBULATORY_CARE_PROVIDER_SITE_OTHER): Payer: Medicare Other | Admitting: Gastroenterology

## 2023-11-28 ENCOUNTER — Encounter (INDEPENDENT_AMBULATORY_CARE_PROVIDER_SITE_OTHER): Payer: Self-pay | Admitting: Gastroenterology

## 2023-11-28 VITALS — BP 158/74 | HR 56 | Temp 97.6°F | Ht 70.0 in | Wt 260.7 lb

## 2023-11-28 DIAGNOSIS — K319 Disease of stomach and duodenum, unspecified: Secondary | ICD-10-CM

## 2023-11-28 DIAGNOSIS — K5520 Angiodysplasia of colon without hemorrhage: Secondary | ICD-10-CM

## 2023-11-28 DIAGNOSIS — K31819 Angiodysplasia of stomach and duodenum without bleeding: Secondary | ICD-10-CM | POA: Diagnosis not present

## 2023-11-28 DIAGNOSIS — D500 Iron deficiency anemia secondary to blood loss (chronic): Secondary | ICD-10-CM

## 2023-11-28 DIAGNOSIS — D509 Iron deficiency anemia, unspecified: Secondary | ICD-10-CM | POA: Diagnosis not present

## 2023-11-28 NOTE — Progress Notes (Addendum)
 Referring Provider: Gareth Morgan, MD Primary Care Physician:  Anabel Halon, MD Primary GI Physician: Dr. Levon Hedger   Chief Complaint  Patient presents with   Anemia    Follow up on anemia.states he had iron iv one week ago and second one scheduled for Monday. Feeling weak, tired and always cold.    HPI:   Lucas Dudley is a 83 y.o. male with past medical history of atrial fibrillation s/p watchman procedure now on chronic ASA, systolic CHF, hypertension, diabetes    Patient presenting today for follow up of IDA and constipation  Last seen August 2024, at that time having some fatigue and some shortness of breath.  Had had 2 iron infusions last month.  Denied rectal bleeding or melena.  Constipation well-managed with MiraLAX stool softener.  Taking 1 iron pill per day and, hematology advised him to continue with this.  Taking Protonix 40 mg daily.  Reported some hemorrhoids that he can push back in, occasionally will see some blood.  Not using anything for this reported hemorrhoidectomy the past that did not improve much for him.  Declined rectal exam at visit.  Patient recommended to continue to follow with hematology, continue p.o. iron pills, continue MiraLAX daily Protonix 40 mg daily, consider CT enterography for duodenal lesion in early 2025  Present: Last labs with iron 21, TIBC 462, saturation 5, ferritin 12, hgb 9.3 (down from 11.2 in October) on 11/18/23  Patient advised by Hematology to have IV feraheme x3 and follow up with GI  Patient states feeling more fatigued, weak for the past  month or so. Has had one iron infusion without improvement in symptoms. He reports he has some upper abdominal discomfort but no nausea, vomiting, early satiety or weight loss. Maintained on PO iron pills, stools are dark but not black, no BRBPR. He is maintained on ASA since his watchman procedure. Overall feeling somewhat poorly over the last few months.   Constipation is well controlled  with a daily stool softener.    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 Z-line irregular, 41 cm from the incisors. - 1 cm hiatal hernia.  - Congested and nodular mucosa in the antrum. Biopsied.- A single duodenal polyp in the bulb.  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 Treated with APC  - 2 tattoos were seen in the proximal jejunum. - Within the 2 tattoos, a subepithelial lesion was  noted. 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 Treated with APC - Normal mucosa was found in the rest of the visualized proximal jejunum. Tattooed the distal  extent of today's SBE.  EUS impression: - An intramural (subepithelial) lesion was found. The lesion appeared to originate from within the  deep mucosa (Layer 2). Tissue has not been obtained. However, the endosonographic appearance  could be concerning for a duplication cyst versus  leiomyoma versus GIST.   Path: dudoenal mucosa with prominent brunners gland and focal foveolar metaplasia-chronic peptic duodenitis  STOMACH, ANTRUM, BIOPSY:  -  Predominantly antral type mucosa with features of both  chemical/reactive gastropathy with focal erosion and moderate chronic  focally active gastritis.  -  An immunohistochemical stain for Helicobacter pylori organisms is  negative.  - An immunohistochemical stain (CK AE1/AE3 is performed and highlights  surface epithelium.    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 Status post ablation.  Small bowel nodule -junction distal duodenum/jejunum. Irregular with a submucosal component. Suspicious in appearance. 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  No active bleeding.  Capsule did not reach cecum. Last Colonoscopy:05/24/22- One 5 mm polyp  in the sigmoid colon-hyperplastic - Diverticulosis in the descending colon and at the splenic flexure. Redundant and elongated colon. - The examination was otherwise normal on direct and retroflexion views. No blood or clot in the lower GI tract. Given negative evaluation today, small bowel evaluation warranted. Last EGD 05/24/22- Normal esophagus. - Small hiatal hernia. Friable gastric mucosa. Couple of tiny gastric erosions the significance of which is uncertain. Status post gastric biopsy - Normal duodenal bulb, second portion of the duodenum and third portion of the duodenum.   Past Medical History:  Diagnosis Date   Arthritis    Atrial fibrillation (HCC)    CAD (coronary artery disease)    a. Cath 03/17/15 showing 100% ostial D1, 50% prox LAD to mid LAD, 40% RPDA stenosis. Med rx. // Myoview 01/2020: EF 31 diffuse perfusion defect without reversibility (suspect artifact); reviewed with Dr. Tama High study felt to be low risk   Cataract    Mixed form OD   Chronic diastolic CHF (congestive heart failure) (HCC)    Diabetic retinopathy (HCC)    NPDR OU   Essential hypertension    Glaucoma    POAG OU   History of gout    Hyperlipidemia    Hypertensive retinopathy    OU   Kidney stones    Melanoma of neck (HCC)    NICM (nonischemic cardiomyopathy) (HCC)    OSA on CPAP 2012   Prostate cancer (HCC)    Type II diabetes mellitus (HCC)     Past Surgical History:  Procedure Laterality Date   ABDOMINAL HERNIA REPAIR     w/mesh   ATRIAL FIBRILLATION ABLATION N/A 06/06/2021   Procedure: ATRIAL FIBRILLATION ABLATION;  Surgeon: Hillis Range, MD;  Location: [facility] INVASIVE CV LAB;  Service: Cardiovascular;  Laterality: N/A;   BIOPSY  05/24/2022   Procedure: BIOPSY;  Surgeon: Corbin Ade, MD;  Location: [facility];  Service: Endoscopy;;   BIOPSY  05/30/2022   Procedure: BIOPSY;  Surgeon: Corbin Ade, MD;  Location: [facility];  Service: Endoscopy;;   BIOPSY  01/10/2023    Procedure: BIOPSY;  Surgeon: Lemar Lofty., MD;  Location: [facility];  Service: Gastroenterology;;   CARDIAC CATHETERIZATION N/A 03/17/2015   Procedure: Left Heart Cath and Coronary Angiography;  Surgeon: Runell Gess, MD;  Location: [facility] INVASIVE CV LAB;  Service: Cardiovascular;  Laterality: N/A;   CARDIOVERSION N/A 08/09/2021   Procedure: CARDIOVERSION;  Surgeon: Chilton Si, MD;  Location: [facility] ENDOSCOPY;  Service: Cardiovascular;  Laterality: N/A;   CARDIOVERSION N/A 11/10/2021   Procedure: CARDIOVERSION;  Surgeon: Thomasene Ripple, DO;  Location: [facility] ENDOSCOPY;  Service: Cardiovascular;  Laterality: N/A;   carotid doppler  09/17/2008   rigt and left ICAs 0-49%;mildly  abnormal   CATARACT EXTRACTION Left 2020   Dr. Alben Spittle   COLONOSCOPY N/A 05/16/2017   Procedure: COLONOSCOPY;  Surgeon: Malissa Hippo, MD;  Location: [facility];  Service: Endoscopy;  Laterality: N/A;  930   COLONOSCOPY WITH PROPOFOL N/A 05/24/2022   Procedure: COLONOSCOPY WITH PROPOFOL;  Surgeon: Corbin Ade, MD;  Location: [facility];  Service: Endoscopy;  Laterality: N/A;   DOPPLER ECHOCARDIOGRAPHY  05/25/2009   EF 50-55%,LA mildly  dilated, LV function normal   ELECTROPHYSIOLOGIC STUDY N/A 04/05/2015   Procedure: Cardioversion;  Surgeon: Thurmon Fair, MD;  Location: [facility] CV LAB;  Service: Cardiovascular;  Laterality: N/A;   ELECTROPHYSIOLOGIC STUDY N/A 09/06/2015   Procedure: Atrial Fibrillation Ablation;  Surgeon: Hillis Range, MD;  Location: [facility] INVASIVE CV LAB;  Service: Cardiovascular;  Laterality: N/A;   ELECTROPHYSIOLOGIC STUDY N/A 07/12/2016   redo afib ablation by Dr Johney Frame   ENTEROSCOPY N/A 05/30/2022   Procedure: ENTEROSCOPY;  Surgeon: Corbin Ade, MD;  [facility]: [facility];  Service: Endoscopy;  Laterality: N/A;   ESOPHAGOGASTRODUODENOSCOPY (EGD) WITH PROPOFOL N/A 05/24/2022   Procedure: ESOPHAGOGASTRODUODENOSCOPY (EGD) WITH PROPOFOL;  Surgeon: Corbin Ade, MD;  [facility];  Service: Endoscopy;  Laterality: N/A;   ESOPHAGOGASTRODUODENOSCOPY (EGD) WITH PROPOFOL N/A 01/10/2023   Procedure: ESOPHAGOGASTRODUODENOSCOPY (EGD) WITH PROPOFOL;  Surgeon: Meridee Score Netty Starring., MD;  Location: [facility] ENDOSCOPY;  Service: Gastroenterology;  Laterality: N/A;   EUS N/A 01/10/2023   Procedure: UPPER ENDOSCOPIC ULTRASOUND (EUS) RADIAL;  Surgeon: Lemar Lofty., MD;  Location: [facility] ENDOSCOPY;  Service: Gastroenterology;  Laterality: N/A;   EXCISIONAL HEMORRHOIDECTOMY     "inside and out"   EYE SURGERY Left 2020   Cat Sx - Dr. Alben Spittle   FINE NEEDLE ASPIRATION Right    knee; "drew ~ 1 quart off"   GIVENS CAPSULE STUDY N/A 05/25/2022   Procedure: GIVENS CAPSULE STUDY;  Surgeon: Lanelle Bal, DO;  [facility]: [facility];  Service: Endoscopy;  Laterality: N/A;   GIVENS CAPSULE STUDY N/A 01/23/2023   Procedure: GIVENS CAPSULE STUDY;  Surgeon: Dolores Frame, MD;  Location: [facility];  Service: Gastroenterology;  Laterality: N/A;  8:30am   HERNIA REPAIR     HOT HEMOSTASIS N/A 01/10/2023   Procedure: HOT HEMOSTASIS (ARGON PLASMA COAGULATION/BICAP);  Surgeon: Lemar Lofty., MD;  Location: [facility] ENDOSCOPY;  Service: Gastroenterology;  Laterality: N/A;   LAPAROSCOPIC CHOLECYSTECTOMY     LEFT ATRIAL APPENDAGE OCCLUSION N/A 06/28/2022   Procedure: LEFT ATRIAL APPENDAGE OCCLUSION;  Surgeon: Tonny Bollman, MD;  Location: [facility] INVASIVE CV LAB;  Service: Cardiovascular;  Laterality: N/A;   MELANOMA EXCISION Right    "neck"   NM MYOCAR PERF WALL MOTION  02/21/2012   EF 61% ,EXERCISE 7 METS. exercise stopped due to wheezing and shortness of breathe   POLYPECTOMY  05/16/2017   Procedure: POLYPECTOMY;  Surgeon: Malissa Hippo, MD;  [facility]: [facility];  Service: Endoscopy;;  colon   POLYPECTOMY  05/24/2022   Procedure: POLYPECTOMY;  Surgeon: Corbin Ade, MD;  [facility];  Service: Endoscopy;;   POLYPECTOMY  01/10/2023   Procedure:  POLYPECTOMY;  Surgeon: Meridee Score Netty Starring., MD;  Location: [facility] ENDOSCOPY;  Service: Gastroenterology;;   PROSTATECTOMY     SHOULDER OPEN ROTATOR CUFF REPAIR Right X 2   SUBMUCOSAL TATTOO INJECTION  05/30/2022   Procedure: SUBMUCOSAL TATTOO INJECTION;  Surgeon: Corbin Ade, MD;  Location: [facility];  Service: Endoscopy;;   SUBMUCOSAL TATTOO INJECTION  01/10/2023   Procedure: SUBMUCOSAL TATTOO INJECTION;  Surgeon: Lemar Lofty., MD;  Location: [facility]OS[facility];  Service: Gastroenterology;;   TEE WITHOUT CARDIOVERSION N/A 09/05/2015   Procedure: TRANSESOPHAGEAL ECHOCARDIOGRAM (TEE);  Surgeon: Quintella Reichert, MD;  Location: [facility] ENDOSCOPY;  Service: Cardiovascular;  Laterality: N/A;   TEE WITHOUT CARDIOVERSION N/A 06/28/2022   Procedure: TRANSESOPHAGEAL ECHOCARDIOGRAM (TEE);  Surgeon: Tonny Bollman, MD;  Location: [facility] INVASIVE CV LAB;  Service: Cardiovascular;  Laterality: N/A;  Current Outpatient Medications  Medication Sig Dispense Refill   acetaminophen (TYLENOL) 650 MG CR tablet Take 1,300 mg by mouth every 8 (eight) hours as needed for pain.     amiodarone (PACERONE) 200 MG tablet TAKE 1 TABLET BY MOUTH EVERY DAY 90 tablet 1   ammonium lactate (AMLACTIN DAILY) 12 % lotion Apply 1 Application topically as needed. 400 g 0   Artificial Tear Ointment (DRY EYES OP) Place 1 drop into both eyes daily as needed (Dry eye).     aspirin 81 MG chewable tablet Chew 81 mg by mouth daily.     camphor-menthol (ANTI-ITCH) lotion Apply 1 Application topically daily as needed for itching.     carvedilol (COREG) 3.125 MG tablet TAKE 1 TABLET BY MOUTH TWICE A DAY 180 tablet 0   COMBIGAN 0.2-0.5 % ophthalmic solution Place 1 drop into both eyes 2 (two) times daily.     ELDERBERRY PO Take 2 tablets by mouth daily. Chewable     fluticasone (FLONASE) 50 MCG/ACT nasal spray SPRAY 2 SPRAYS INTO EACH NOSTRIL EVERY DAY 48 mL 1   gemfibrozil (LOPID) 600 MG tablet Take 600 mg by mouth at bedtime.       glimepiride (AMARYL) 2 MG tablet Take 1 tablet (2 mg total) by mouth 2 (two) times daily with a meal. 60 tablet 3   Insulin Glargine (BASAGLAR KWIKPEN) 100 UNIT/ML Inject 45 Units into the skin at bedtime. 15 mL 3   lisinopril (ZESTRIL) 40 MG tablet Take 1 tablet (40 mg total) by mouth daily. 90 tablet 1   magnesium gluconate (MAGONATE) 500 MG tablet Take 500 mg by mouth daily.     metFORMIN (GLUCOPHAGE-XR) 500 MG 24 hr tablet Take 1,000 mg by mouth in the morning and at bedtime.     mineral oil liquid Take 15 mLs by mouth daily as needed for moderate constipation.     OVER THE COUNTER MEDICATION Take 1 tablet by mouth daily. Beets Chew     pantoprazole (PROTONIX) 40 MG tablet TAKE 1 TABLET BY MOUTH EVERY DAY 90 tablet 1   torsemide (DEMADEX) 20 MG tablet TAKE 1 TABLET BY MOUTH TWICE A DAY 180 tablet 1   zinc gluconate 50 MG tablet Take 50 mg by mouth daily.     ferrous sulfate 325 (65 FE) MG tablet Take 1 tablet (325 mg total) by mouth daily with breakfast. 120 tablet 1   No current facility-administered medications for this visit.    Allergies as of 11/28/2023 - Review Complete 11/28/2023  Allergen Reaction Noted   Azithromycin Nausea Only 11/10/2021   Tape Rash and Other (See Comments) 01/28/2012    Family History  Problem Relation Age of Onset   Heart disease Mother    Lung cancer Mother    Heart attack Mother 29   Diabetes Father    Heart disease Father    Stroke Brother    Healthy Daughter    Colon cancer Maternal Aunt        27s    Social History   Socioeconomic History   Marital status: Widowed    Spouse name: Not on file   Number of children: 1   Years of education: Not on file   Highest education level: Not on file  Occupational History   Occupation: Retired  Tobacco Use   Smoking status: Former    Current packs/day: 0.00    Average packs/day: 2.0 packs/day for 27.0 years (54.0 ttl pk-yrs)    Types: Cigarettes  Start date: 55    Quit date: 1992     Years since quitting: 33.1    Passive exposure: Current   Smokeless tobacco: Never   Tobacco comments:    Former smoker 09/27/21  Vaping Use   Vaping status: Never Used  Substance and Sexual Activity   Alcohol use: No    Alcohol/week: 0.0 standard drinks of alcohol    Comment: "used to drink; stopped ~ 2008"   Drug use: No   Sexual activity: Not Currently  Other Topics Concern   Not on file  Social History Narrative   Lives in [place], [place] with wife.   Social Drivers of Corporate investment banker Strain: Low Risk  (11/20/2023)   Overall Financial Resource Strain (CARDIA)    Difficulty of Paying Living Expenses: Not hard at all  Food Insecurity: No Food Insecurity (11/20/2023)   Hunger Vital Sign    Worried About Running Out of Food in the Last Year: Never true    Ran Out of Food in the Last Year: Never true  Transportation Needs: No Transportation Needs (11/20/2023)   PRAPARE - Administrator, Civil Service (Medical): No    Lack of Transportation (Non-Medical): No  Physical Activity: Inactive (11/20/2023)   Exercise Vital Sign    Days of Exercise per Week: 0 days    Minutes of Exercise per Session: 0 min  Stress: No Stress Concern Present (11/20/2023)   [employer] of Occupational Health - Occupational Stress Questionnaire    Feeling of Stress : Not at all  Social Connections: Moderately Isolated (11/20/2023)   Social Connection and Isolation Panel [NHANES]    Frequency of Communication with Friends and Family: More than three times a week    Frequency of Social Gatherings with Friends and Family: Once a week    Attends Religious Services: More than 4 times per year    Active Member of [employer] or Organizations: No    Attends Banker Meetings: Never    Marital Status: Widowed    Review of systems General: negative for  night sweats, fever, chills, weight loss +fatigue  Neck: Negative for lumps, goiter, pain and significant neck swelling Resp: Negative  for cough, wheezing, dyspnea at rest CV: Negative for chest pain, leg swelling, palpitations, orthopnea GI: denies melena, hematochezia, nausea, vomiting, diarrhea, constipation, dysphagia, odyonophagia, early satiety or unintentional weight loss.  The remainder of the review of systems is noncontributory.  Physical Exam: There were no vitals taken for this visit. General:   Alert and oriented. No distress noted. Pleasant and cooperative.  Head:  Normocephalic and atraumatic. Eyes:  Conjuctiva clear without scleral icterus. Mouth:  Oral mucosa pink and moist. Good dentition. No lesions. Heart: Normal rate and rhythm, s1 and s2 heart sounds present.  Lungs: Clear lung sounds in all lobes. Respirations equal and unlabored. Abdomen:  +BS, soft, non-tender and non-distended. No rebound or guarding. No HSM or masses noted. Neurologic:  Alert and  oriented x4 Psych:  Alert and cooperative. Normal mood and affect.  Invalid input(s): "6 MONTHS"   ASSESSMENT: Lucas Dudley is a 83 y.o. 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  He is maintained on p.o. iron daily continues to follow with hematology, has had a decline in his iron levels in the  past few months and has required further iron infusions without much improvement in his symptoms.  He denies fresh bleeding or melena.  At this time would recommend proceeding with EGD/push enteroscopy given known small bowel AVMs, we did discuss colonoscopy which patient declines at this time.  He may ultimately need to undergo colonoscopy for further evaluation of his GI bleeding if patient endoscopy is unremarkable.  We also discussed potential for starting octreotide pending what patient uroscopy shows especially if bleeding is thought to be secondary of  AVMs.  Duodenal/jejunal lesion: Noted on EGD August 2023, he underwent EUS with Dr. 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  Will proceed with push enteroscopy as above and consider CT enterography thereafter pending findings.  Indications, risks and benefits of procedure discussed in detail with patient. Patient verbalized understanding and is in agreement to proceed with push enteroscopy.    PLAN:  -push enteroscopy ASA III  -consider CTE pending push enteroscopy findings regarding duodenal/jejunal lesion -continue to follow with hematology for iron infusions -continue PO iron pills -continue protonix 40mg  daily  -will need to consider initiation of octreotide if continued presence of AVMs suspected as source of GI bleeding  All questions were answered, patient verbalized understanding and is in agreement with plan as outlined above.    Follow Up: 3 months   Rodrigo Mcgranahan L. Jeanmarie Hubert, MSN, APRN, AGNP-C Adult-Gerontology Nurse Practitioner [facility]  I have reviewed the note and agree with the APP's assessment as described in this progress note  Katrinka Blazing, MD Gastroenterology and Hepatology [facility] Gastroenterology

## 2023-11-28 NOTE — Patient Instructions (Signed)
Will get you scheduled for upper endoscopy with evaluation of the small bowel, as discussed you have a history of vascular lesions in your small bowel that I suspect may be bleeding. Pending what we find on exam, we may have to start you on a special medicine that helps control these lesions or bleeding in the future. Please continue to take Protonix 40 mg daily and iron pill daily. Please continue to follow with hematology for iron infusions  Follow-up 3 months

## 2023-11-29 ENCOUNTER — Ambulatory Visit (INDEPENDENT_AMBULATORY_CARE_PROVIDER_SITE_OTHER): Payer: Medicare Other | Admitting: Ophthalmology

## 2023-11-29 ENCOUNTER — Encounter (INDEPENDENT_AMBULATORY_CARE_PROVIDER_SITE_OTHER): Payer: Self-pay | Admitting: Ophthalmology

## 2023-11-29 DIAGNOSIS — Z961 Presence of intraocular lens: Secondary | ICD-10-CM

## 2023-11-29 DIAGNOSIS — E113213 Type 2 diabetes mellitus with mild nonproliferative diabetic retinopathy with macular edema, bilateral: Secondary | ICD-10-CM

## 2023-11-29 DIAGNOSIS — Z794 Long term (current) use of insulin: Secondary | ICD-10-CM

## 2023-11-29 DIAGNOSIS — I10 Essential (primary) hypertension: Secondary | ICD-10-CM

## 2023-11-29 DIAGNOSIS — Z8619 Personal history of other infectious and parasitic diseases: Secondary | ICD-10-CM

## 2023-11-29 DIAGNOSIS — H18523 Epithelial (juvenile) corneal dystrophy, bilateral: Secondary | ICD-10-CM

## 2023-11-29 DIAGNOSIS — Z7984 Long term (current) use of oral hypoglycemic drugs: Secondary | ICD-10-CM

## 2023-11-29 DIAGNOSIS — H401130 Primary open-angle glaucoma, bilateral, stage unspecified: Secondary | ICD-10-CM

## 2023-11-29 DIAGNOSIS — H35033 Hypertensive retinopathy, bilateral: Secondary | ICD-10-CM

## 2023-11-29 NOTE — Progress Notes (Signed)
 [facility] - Clinic Note  11/29/2023    CHIEF COMPLAINT Patient presents for Retina Follow Up  HISTORY OF PRESENT ILLNESS: Lucas Dudley is a 83 y.o. male who presents to the clinic today for:  HPI     Retina Follow Up   Patient presents with  Diabetic Retinopathy.  In both eyes.  This started 5 weeks ago.  Duration of 5 weeks.  Since onset it is stable.  I, the attending physician,  performed the HPI with the patient and updated documentation appropriately.        Comments   5 week retina follow up NPDR IVV OD and IVV OS ? Pt is reporting no vision changes noticed he denies any flashes he has some floaters pt has been running high and low his last reading 119      Last edited by Rennis Chris, MD on 11/30/2023 12:00 AM.     Referring physician: Anabel Halon, MD [address] [place],  [place] [address]  HISTORICAL INFORMATION:   Selected notes from the MEDICAL RECORD NUMBER Referred by Dr. Alben Spittle for DEE   CURRENT MEDICATIONS: Current Outpatient Medications (Ophthalmic Drugs)  Medication Sig   Artificial Tear Ointment (DRY EYES OP) Place 1 drop into both eyes daily as needed (Dry eye).   COMBIGAN 0.2-0.5 % ophthalmic solution Place 1 drop into both eyes 2 (two) times daily.   No current facility-administered medications for this visit. (Ophthalmic Drugs)   Current Outpatient Medications (Other)  Medication Sig   acetaminophen (TYLENOL) 650 MG CR tablet Take 1,300 mg by mouth every 8 (eight) hours as needed for pain.   amiodarone (PACERONE) 200 MG tablet TAKE 1 TABLET BY MOUTH EVERY DAY   ammonium lactate (AMLACTIN DAILY) 12 % lotion Apply 1 Application topically as needed.   aspirin 81 MG chewable tablet Chew 81 mg by mouth daily.   camphor-menthol (ANTI-ITCH) lotion Apply 1 Application topically daily as needed for itching.   carvedilol (COREG) 3.125 MG tablet TAKE 1 TABLET BY MOUTH TWICE A DAY   ELDERBERRY PO Take 2 tablets by mouth daily.  Chewable   ferrous sulfate 325 (65 FE) MG tablet Take 1 tablet (325 mg total) by mouth daily with breakfast.   fluticasone (FLONASE) 50 MCG/ACT nasal spray SPRAY 2 SPRAYS INTO EACH NOSTRIL EVERY DAY   gemfibrozil (LOPID) 600 MG tablet Take 600 mg by mouth at bedtime.    glimepiride (AMARYL) 2 MG tablet Take 1 tablet (2 mg total) by mouth 2 (two) times daily with a meal.   Insulin Glargine (BASAGLAR KWIKPEN) 100 UNIT/ML Inject 45 Units into the skin at bedtime.   lisinopril (ZESTRIL) 40 MG tablet Take 1 tablet (40 mg total) by mouth daily.   magnesium gluconate (MAGONATE) 500 MG tablet Take 500 mg by mouth daily.   metFORMIN (GLUCOPHAGE-XR) 500 MG 24 hr tablet Take 1,000 mg by mouth in the morning and at bedtime.   mineral oil liquid Take 15 mLs by mouth daily as needed for moderate constipation.   OVER THE COUNTER MEDICATION Take 1 tablet by mouth daily. Beets Chew   pantoprazole (PROTONIX) 40 MG tablet TAKE 1 TABLET BY MOUTH EVERY DAY   torsemide (DEMADEX) 20 MG tablet TAKE 1 TABLET BY MOUTH TWICE A DAY   zinc gluconate 50 MG tablet Take 50 mg by mouth daily.   No current facility-administered medications for this visit. (Other)   REVIEW OF SYSTEMS: ROS   Positive for: Gastrointestinal, Endocrine,  Cardiovascular, Eyes, Respiratory Negative for: Constitutional, Neurological, Skin, Genitourinary, Musculoskeletal, HENT, Psychiatric, Allergic/Imm, Heme/Lymph Last edited by Etheleen Mayhew, COT on 11/29/2023  2:02 PM.     ALLERGIES Allergies  Allergen Reactions   Azithromycin Nausea Only   Tape Rash and Other (See Comments)    Causes skin redness, Use paper tape only.   PAST MEDICAL HISTORY Past Medical History:  Diagnosis Date   Arthritis    Atrial fibrillation (HCC)    CAD (coronary artery disease)    a. Cath 03/17/15 showing 100% ostial D1, 50% prox LAD to mid LAD, 40% RPDA stenosis. Med rx. // Myoview 01/2020: EF 31 diffuse perfusion defect without reversibility (suspect  artifact); reviewed with Dr. Tama High study felt to be low risk   Cataract    Mixed form OD   Chronic diastolic CHF (congestive heart failure) (HCC)    Diabetic retinopathy (HCC)    NPDR OU   Essential hypertension    Glaucoma    POAG OU   History of gout    Hyperlipidemia    Hypertensive retinopathy    OU   Kidney stones    Melanoma of neck (HCC)    NICM (nonischemic cardiomyopathy) (HCC)    OSA on CPAP 2012   Prostate cancer (HCC)    Type II diabetes mellitus (HCC)    Past Surgical History:  Procedure Laterality Date   ABDOMINAL HERNIA REPAIR     w/mesh   ATRIAL FIBRILLATION ABLATION N/A 06/06/2021   Procedure: ATRIAL FIBRILLATION ABLATION;  Surgeon: Hillis Range, MD;  Location: [facility] INVASIVE CV LAB;  Service: Cardiovascular;  Laterality: N/A;   BIOPSY  05/24/2022   Procedure: BIOPSY;  Surgeon: Corbin Ade, MD;  Location: [facility];  Service: Endoscopy;;   BIOPSY  05/30/2022   Procedure: BIOPSY;  Surgeon: Corbin Ade, MD;  Location: [facility];  Service: Endoscopy;;   BIOPSY  01/10/2023   Procedure: BIOPSY;  Surgeon: Lemar Lofty., MD;  Location: [facility];  Service: Gastroenterology;;   CARDIAC CATHETERIZATION N/A 03/17/2015   Procedure: Left Heart Cath and Coronary Angiography;  Surgeon: Runell Gess, MD;  Location: [facility] INVASIVE CV LAB;  Service: Cardiovascular;  Laterality: N/A;   CARDIOVERSION N/A 08/09/2021   Procedure: CARDIOVERSION;  Surgeon: Chilton Si, MD;  Location: [facility] ENDOSCOPY;  Service: Cardiovascular;  Laterality: N/A;   CARDIOVERSION N/A 11/10/2021   Procedure: CARDIOVERSION;  Surgeon: Thomasene Ripple, DO;  Location: [facility] ENDOSCOPY;  Service: Cardiovascular;  Laterality: N/A;   carotid doppler  09/17/2008   rigt and left ICAs 0-49%;mildly  abnormal   CATARACT EXTRACTION Left 2020   Dr. Alben Spittle   COLONOSCOPY N/A 05/16/2017   Procedure: COLONOSCOPY;  Surgeon: Malissa Hippo, MD;  [facility]: [facility];  Service: Endoscopy;   Laterality: N/A;  930   COLONOSCOPY WITH PROPOFOL N/A 05/24/2022   Procedure: COLONOSCOPY WITH PROPOFOL;  Surgeon: Corbin Ade, MD;  [facility]: [facility];  Service: Endoscopy;  Laterality: N/A;   DOPPLER ECHOCARDIOGRAPHY  05/25/2009   EF 50-55%,LA mildly dilated, LV function normal   ELECTROPHYSIOLOGIC STUDY N/A 04/05/2015   Procedure: Cardioversion;  Surgeon: Thurmon Fair, MD;  Location: [facility] CV LAB;  Service: Cardiovascular;  Laterality: N/A;   ELECTROPHYSIOLOGIC STUDY N/A 09/06/2015   Procedure: Atrial Fibrillation Ablation;  Surgeon: Hillis Range, MD;  Location: [facility] INVASIVE CV LAB;  Service: Cardiovascular;  Laterality: N/A;   ELECTROPHYSIOLOGIC STUDY N/A 07/12/2016   redo afib ablation by Dr Johney Frame   ENTEROSCOPY N/A 05/30/2022  Procedure: ENTEROSCOPY;  Surgeon: Corbin Ade, MD;  [facility]: [facility];  Service: Endoscopy;  Laterality: N/A;   ESOPHAGOGASTRODUODENOSCOPY (EGD) WITH PROPOFOL N/A 05/24/2022   Procedure: ESOPHAGOGASTRODUODENOSCOPY (EGD) WITH PROPOFOL;  Surgeon: Corbin Ade, MD;  [facility];  Service: Endoscopy;  Laterality: N/A;   ESOPHAGOGASTRODUODENOSCOPY (EGD) WITH PROPOFOL N/A 01/10/2023   Procedure: ESOPHAGOGASTRODUODENOSCOPY (EGD) WITH PROPOFOL;  Surgeon: Meridee Score Netty Starring., MD;  Location: [facility] ENDOSCOPY;  Service: Gastroenterology;  Laterality: N/A;   EUS N/A 01/10/2023   Procedure: UPPER ENDOSCOPIC ULTRASOUND (EUS) RADIAL;  Surgeon: Lemar Lofty., MD;  Location: [facility] ENDOSCOPY;  Service: Gastroenterology;  Laterality: N/A;   EXCISIONAL HEMORRHOIDECTOMY     "inside and out"   EYE SURGERY Left 2020   Cat Sx - Dr. Alben Spittle   FINE NEEDLE ASPIRATION Right    knee; "drew ~ 1 quart off"   GIVENS CAPSULE STUDY N/A 05/25/2022   Procedure: GIVENS CAPSULE STUDY;  Surgeon: Lanelle Bal, DO;  [facility]: [facility];  Service: Endoscopy;  Laterality: N/A;   GIVENS CAPSULE STUDY N/A 01/23/2023   Procedure: GIVENS CAPSULE STUDY;   Surgeon: Dolores Frame, MD;  [facility]: [facility];  Service: Gastroenterology;  Laterality: N/A;  8:30am   HERNIA REPAIR     HOT HEMOSTASIS N/A 01/10/2023   Procedure: HOT HEMOSTASIS (ARGON PLASMA COAGULATION/BICAP);  Surgeon: Lemar Lofty., MD;  Location: [facility];  Service: Gastroenterology;  Laterality: N/A;   LAPAROSCOPIC CHOLECYSTECTOMY     LEFT ATRIAL APPENDAGE OCCLUSION N/A 06/28/2022   Procedure: LEFT ATRIAL APPENDAGE OCCLUSION;  Surgeon: Tonny Bollman, MD;  Location: [facility] INVASIVE CV LAB;  Service: Cardiovascular;  Laterality: N/A;   MELANOMA EXCISION Right    "neck"   NM MYOCAR PERF WALL MOTION  02/21/2012   EF 61% ,EXERCISE 7 METS. exercise stopped due to wheezing and shortness of breathe   POLYPECTOMY  05/16/2017   Procedure: POLYPECTOMY;  Surgeon: Malissa Hippo, MD;  [facility];  Service: Endoscopy;;  colon   POLYPECTOMY  05/24/2022   Procedure: POLYPECTOMY;  Surgeon: Corbin Ade, MD;  [facility];  Service: Endoscopy;;   POLYPECTOMY  01/10/2023   Procedure: POLYPECTOMY;  Surgeon: Meridee Score Netty Starring., MD;  Location: [facility];  Service: Gastroenterology;;   PROSTATECTOMY     SHOULDER OPEN ROTATOR CUFF REPAIR Right X 2   SUBMUCOSAL TATTOO INJECTION  05/30/2022   Procedure: SUBMUCOSAL TATTOO INJECTION;  Surgeon: Corbin Ade, MD;  Location: [facility];  Service: Endoscopy;;   SUBMUCOSAL TATTOO INJECTION  01/10/2023   Procedure: SUBMUCOSAL TATTOO INJECTION;  Surgeon: Lemar Lofty., MD;  Location: [facility];  Service: Gastroenterology;;   TEE WITHOUT CARDIOVERSION N/A 09/05/2015   Procedure: TRANSESOPHAGEAL ECHOCARDIOGRAM (TEE);  Surgeon: Quintella Reichert, MD;  Location: [facility] ENDOSCOPY;  Service: Cardiovascular;  Laterality: N/A;   TEE WITHOUT CARDIOVERSION N/A 06/28/2022   Procedure: TRANSESOPHAGEAL ECHOCARDIOGRAM (TEE);  Surgeon: Tonny Bollman, MD;  Location: [facility] INVASIVE CV LAB;  Service: Cardiovascular;   Laterality: N/A;   FAMILY HISTORY Family History  Problem Relation Age of Onset   Heart disease Mother    Lung cancer Mother    Heart attack Mother 74   Diabetes Father    Heart disease Father    Stroke Brother    Healthy Daughter    Colon cancer Maternal Aunt        10s   SOCIAL HISTORY Social History   Tobacco Use   Smoking status: Former  Current packs/day: 0.00    Average packs/day: 2.0 packs/day for 27.0 years (54.0 ttl pk-yrs)    Types: Cigarettes    Start date: 83    Quit date: 67    Years since quitting: 33.1    Passive exposure: Current   Smokeless tobacco: Never   Tobacco comments:    Former smoker 09/27/21  Vaping Use   Vaping status: Never Used  Substance Use Topics   Alcohol use: No    Alcohol/week: 0.0 standard drinks of alcohol    Comment: "used to drink; stopped ~ 2008"   Drug use: No       OPHTHALMIC EXAM: Base Eye Exam     Visual Acuity (Snellen - Linear)       Right Left   Dist cc 20/25 -1 20/30 -1   Dist ph cc 20/20 -2 NI         Tonometry (Tonopen, 2:09 PM)       Right Left   Pressure 17 16         Pupils       Pupils Dark Light Shape React APD   Right PERRL 2 1 Round Brisk None   Left PERRL 2 1 Round Brisk None         Visual Fields       Left Right    Full Full         Extraocular Movement       Right Left    Full, Ortho Full, Ortho         Neuro/Psych     Oriented x3: Yes   Mood/Affect: Normal         Dilation     Both eyes: 2.5% Phenylephrine @ 2:07 PM           Slit Lamp and Fundus Exam     Slit Lamp Exam       Right Left   Lids/Lashes Dermato, mild MGD Dermato, mild MGD   Conjunctiva/Sclera Temporal pinguecula, mild inferior sub conj heme Temporal pinguecula   Cornea EBMD, mild haze, trace PEE, mild Debris in tear film, well healed cataract wound trace haze, trace PEE, well healed temporal cataract wounds, arcus   Anterior Chamber Deep and clear; narrow temporal angle Deep  and clear   Iris Round and dilated, mild anterior bowing, No NVI Round and moderately dilated to 5.4mm   Lens PCIOL in good position, trace PCO PC IOL in good position with open PC   Anterior Vitreous Synerisis Synerisis         Fundus Exam       Right Left   Disc Mild pallor, sharp rim, +cupping, thin inferior rim Mild pallor, sharp rim, +cupping, +PPA   C/D Ratio 0.7 0.7   Macula Flat, good foveal reflex, cystic changes - slightly improved, RPE mottling and clumping, no heme, Drusen, no exudates -- resolved Flat, blunted foveal reflex, trace cystic changes, punctate MA   Vessels attenuated, Tortuous attenuated, Tortuous   Periphery Attached, rare MA, focal DBH nasal to disc--improved Attached. No heme.           Refraction     Wearing Rx       Sphere Cylinder Axis Add   Right -0.25 +2.00 158 +2.50   Left -1.00 +1.25 008 +2.50           IMAGING AND PROCEDURES  Imaging and Procedures for 11/29/2023  OCT, Retina - OU - Both Eyes       Right  Eye Quality was good. Central Foveal Thickness: 291. Progression has improved. Findings include no SRF, abnormal foveal contour, intraretinal fluid, vitreomacular adhesion (persistent IRF/IRHM -- slightly improved, partial PVD).   Left Eye Quality was good. Central Foveal Thickness: 288. Progression has been stable. Findings include normal foveal contour, no IRF, no SRF, intraretinal hyper-reflective material (Trace persistent cystic changes and punctate IRHM inferior fovea; partial PVD).   Notes *Images captured and stored on drive  Diagnosis / Impression:  +DME OU OD: persistent IRF/IRHM -- slightly improved, partial PVD OS: Trace persistent cystic changes and punctate IRHM inferior fovea; partial PVD  Clinical management:  See below  Abbreviations: NFP - Normal foveal profile. CME - cystoid macular edema. PED - pigment epithelial detachment. IRF - intraretinal fluid. SRF - subretinal fluid. EZ - ellipsoid zone. ERM -  epiretinal membrane. ORA - outer retinal atrophy. ORT - outer retinal tubulation. SRHM - subretinal hyper-reflective material. IRHM - intraretinal hyper-reflective material     Intravitreal Injection, Pharmacologic Agent - OD - Right Eye       Time Out 11/29/2023. 2:43 PM. Confirmed correct patient, procedure, site, and patient consented.   Anesthesia Topical anesthesia was used. Anesthetic medications included Lidocaine 2%, Proparacaine 0.5%.   Procedure Preparation included 5% betadine to ocular surface, eyelid speculum. A (32g) needle was used.   Injection: 6 mg faricimab-svoa 6 MG/0.05ML (Patient supplied)   Route: Intravitreal, Site: Right Eye   NDC: 16109-604-54, Lot: [id number], Expiration date: 04/13/2025, Waste: 0 mL   Post-op Post injection exam found visual acuity of at least counting fingers. The patient tolerated the procedure well. There were no complications. The patient received written and verbal post procedure care education.   Notes **SAMPLE MEDICATION ADMINISTERED**      Intravitreal Injection, Pharmacologic Agent - OS - Left Eye       Time Out 11/29/2023. 2:43 PM. Confirmed correct patient, procedure, site, and patient consented.   Anesthesia Topical anesthesia was used. Anesthetic medications included Lidocaine 2%, Proparacaine 0.5%.   Procedure Preparation included 5% betadine to ocular surface, eyelid speculum. A (32g) needle was used.   Injection: 6 mg faricimab-svoa 6 MG/0.05ML (Patient supplied)   Route: Intravitreal, Site: Left Eye   NDC: 78295-621-30, Lot: [id number], Expiration date: 06/14/2025, Waste: 0 mL   Post-op Post injection exam found visual acuity of at least counting fingers. The patient tolerated the procedure well. There were no complications. The patient received written and verbal post procedure care education. Post injection medications were not given.   Notes **SAMPLE MEDICATION ADMINISTERED**           ASSESSMENT/PLAN:    ICD-10-CM   1. 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 Current use of insulin (HCC)  Z79.4     3. Long term (current) use of oral hypoglycemic drugs  Z79.84     4. Essential hypertension  I10     5. Hypertensive retinopathy of both eyes  H35.033     6. Pseudophakia of both eyes  Z96.1     7. 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 History of herpes zoster  of eye  Z86.19     9. Primary open angle glaucoma of both eyes, unspecified glaucoma stage  H40.1130      1-3. 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 Single focal MA superior to fovea; OS: Perifoveal Mas w/ late leakage - s/p IVA OS # 1(07.26.21), #2 (08.24.21), #3 (09.21.21), #4 (10.22.21) -- IVA resistance ========================================================== - s/p IVE OD #1 (09.29.23), #2 (10.30.23), #3 (SAMPLE) (11.27.23) #4 (12.29.23) #5 (01.26.24) #6 (02.23.24) -- IVE resistance - s/p IVE OS #1 (11.22.21), #2 (12.21.21), #3 (01.20.22), #4 (02.18.22), #5 (03.18.22), #6 (04.15.22), #7 (05.10.22) -- sample, #8 (06.08.22), #9 (07.12.22), #10 (08.12.22), #11 (09.16.22), #12 (10.21.22), #13 (11.18.22), #14 (12.21.22), #15 (01.20.23), #16 (02.17.23), #17 (03.24.23), #18 (04.21.23), #19 (05.26.23), #20 (06.23.23) -- IVE resistance =========================================================== - s/p IVV OD #1 (03.22.24), #2 (04.19.24), #3 (05.17.24), #4 (06.18.24), #5 (07.19.24), #6 (08.23.24), #7 (09.27.24), #8 (11.01.24) #9(12.06.24), #10 (01.10.25 -- sample) - s/p IVV OS #1  (07.21.23), #2 (08.25.23), #3 (09.29.23), #4 (10.30.23), #5 (11.27.23) #6 (12.29.23), #7 (01.26.23), #8 (02.23.24), #9 (03.22.24), #10 (04.19.24), #11 (05.17.24) #12 (12.06.24) - BCVA OD: 20/20 -- stable; OS decreased to 20/30 from 20/20 - OCT shows  OD: persistent IRF/IRHM -- slightly improved, partial PVD; OS: Trace persistent cystic changes and punctate IRHM inferior fovea; partial PVD at 5 wks - recommend IVV OD #11 and IVV OS #13 [both samples] today, 02.14.25 with f/u in 5 weeks [[employer] funding unavailable] - treating OS prn - pt wishes to proceed with injections OU  - RBA of procedure discussed, questions answered  - Vabysmo informed consent obtained and signed, 02.14.25 (OU) - Eylea approved until 10/04/23 - [employer] approved Vabysmo, no good days for 2025 as of now - see procedure note - f/u 5 weeks -- DFE/OCT, possible injection(s)  4,5. Hypertensive retinopathy OU - discussed importance of tight BP control - continue to monitor  6. Pseudophakia OU  - s/p CE/IOL OS (2020, Dr. Alben Spittle)             - s/p CE/IOL OD (2022, Dr. Alben Spittle)  - s/p YAG cap OS (04.04.23) - BCVA 20/30 OD and 20/25 OS from 20/150  - IOLs in good position, doing well  - continue to monitor  7. 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 Valere Dross in August 2022  - s/p SuperK OS on 12.12.2022  8. History of herpes zoster/iritis OS  - on Valtrex 1 g daily---maintenance  9. POAG OU  - was under the expert management of Dr. Alben Spittle, now follows with Dr. Zenaida Niece  - IOP 17,16  - continue Cosopt bid OU  Ophthalmic Meds Ordered this visit:  Meds ordered this encounter  Medications   faricimab-svoa (VABYSMO) 6mg /0.26mL intravitreal injection   faricimab-svoa (VABYSMO) 6mg /0.9mL intravitreal injection     This document serves as a record of services personally performed by Karie Chimera, MD, PhD. It was created on their behalf by Berlin Hun COT, an ophthalmic technician. The creation of this record  is the provider's dictation and/or activities during the visit.    Electronically signed by: Berlin Hun COT 02.12.25 12:03 AM  This document serves as a record of services personally performed by Karie Chimera, MD, PhD. It was created on their behalf by Glee Arvin. Manson Passey, OA an ophthalmic technician. The creation of this record is the provider's dictation and/or activities during the visit.    Electronically signed by: Glee Arvin. Manson Passey, OA 11/30/23 12:03 AM  Karie Chimera, M.D., Ph.D. Diseases & Surgery of the Retina and Vitreous [facility] 11/29/2023   I have reviewed the above documentation for accuracy and completeness, and I agree with the above. Karie Chimera, M.D., Ph.D. 11/30/23 12:04 AM   Abbreviations: M myopia (nearsighted); A astigmatism; H hyperopia (farsighted); P presbyopia; Mrx spectacle prescription;  CTL contact lenses; OD right eye; OS left eye; OU both eyes  XT exotropia; ET esotropia; PEK punctate epithelial keratitis; PEE punctate epithelial erosions; DES dry eye syndrome; MGD meibomian gland dysfunction; ATs artificial tears; PFAT's preservative free artificial tears; NSC nuclear sclerotic cataract; PSC posterior subcapsular cataract; ERM epi-retinal membrane; PVD posterior vitreous detachment; RD retinal detachment; DM diabetes mellitus; DR diabetic retinopathy; NPDR non-proliferative diabetic retinopathy; PDR proliferative diabetic retinopathy; CSME clinically significant macular edema; DME diabetic macular edema; dbh dot blot hemorrhages; CWS cotton wool spot; POAG primary open angle glaucoma; C/D cup-to-disc ratio; HVF humphrey visual field; GVF goldmann visual field; OCT optical coherence tomography; IOP intraocular pressure; BRVO Branch retinal vein occlusion; CRVO central retinal vein occlusion; CRAO central retinal artery occlusion; BRAO branch retinal artery occlusion; RT retinal tear; SB scleral buckle; PPV pars plana vitrectomy; VH  Vitreous hemorrhage; PRP panretinal laser photocoagulation; IVK intravitreal kenalog; VMT vitreomacular traction; MH Macular hole;  NVD neovascularization of the disc; NVE neovascularization elsewhere; AREDS age related eye disease study; ARMD age related macular degeneration; POAG primary open angle glaucoma; EBMD epithelial/anterior basement membrane dystrophy; ACIOL anterior chamber intraocular lens; IOL intraocular lens; PCIOL posterior chamber intraocular lens; Phaco/IOL phacoemulsification with intraocular lens placement; PRK photorefractive keratectomy; LASIK laser assisted in situ keratomileusis; HTN hypertension; DM diabetes mellitus; COPD chronic obstructive pulmonary disease

## 2023-11-30 ENCOUNTER — Encounter (INDEPENDENT_AMBULATORY_CARE_PROVIDER_SITE_OTHER): Payer: Self-pay | Admitting: Ophthalmology

## 2023-11-30 MED ORDER — FARICIMAB-SVOA 6 MG/0.05ML IZ SOLN
6.0000 mg | INTRAVITREAL | Status: AC | PRN
  Administered 2023-11-30: 6 mg via INTRAVITREAL

## 2023-12-02 ENCOUNTER — Encounter (INDEPENDENT_AMBULATORY_CARE_PROVIDER_SITE_OTHER): Payer: Self-pay

## 2023-12-02 ENCOUNTER — Inpatient Hospital Stay: Payer: Medicare Other

## 2023-12-02 VITALS — BP 168/73 | HR 54 | Temp 97.9°F | Resp 17 | Wt 265.6 lb

## 2023-12-02 DIAGNOSIS — D509 Iron deficiency anemia, unspecified: Secondary | ICD-10-CM | POA: Diagnosis not present

## 2023-12-02 DIAGNOSIS — D500 Iron deficiency anemia secondary to blood loss (chronic): Secondary | ICD-10-CM

## 2023-12-02 MED ORDER — SODIUM CHLORIDE 0.9 % IV SOLN: INTRAVENOUS | Status: DC

## 2023-12-02 MED ORDER — SODIUM CHLORIDE 0.9 % IV SOLN
510.0000 mg | Freq: Once | INTRAVENOUS | Status: AC
  Administered 2023-12-02: 510 mg via INTRAVENOUS
  Filled 2023-12-02: qty 510

## 2023-12-02 NOTE — Progress Notes (Signed)
 Patient presents today for Feraheme infusion. Patient took pre-meds prior to arrival. Patient denies any side effects related to his last iron.  Feraheme given today per MD orders. Tolerated infusion without adverse affects. Vital signs stable. No complaints at this time. Discharged from clinic by wheel chair in stable condition. Alert and oriented x 3. F/U with [facility] as scheduled.

## 2023-12-02 NOTE — Patient Instructions (Signed)
 [facility] [place] - A DEPT OF [facility]  Discharge Instructions: Thank you for choosing [facility] to provide your oncology and hematology care.  If you have a lab appointment with the Cancer Center - please note that after April 8th, 2024, all labs will be drawn in the cancer center.  You do not have to check in or register with the main entrance as you have in the past but will complete your check-in in the cancer center.  Wear comfortable clothing and clothing appropriate for easy access to any Portacath or PICC line.   We strive to give you quality time with your provider. You may need to reschedule your appointment if you arrive late (15 or more minutes).  Arriving late affects you and other patients whose appointments are after yours.  Also, if you miss three or more appointments without notifying the office, you may be dismissed from the clinic at the provider's discretion.      For prescription refill requests, have your pharmacy contact our office and allow 72 hours for refills to be completed.    Today you received the following chemotherapy and/or immunotherapy agents Feraheme. Ferumoxytol Injection What is this medication? FERUMOXYTOL (FER ue MOX i tol) treats low levels of iron in your body (iron deficiency anemia). Iron is a mineral that plays an important role in making red blood cells, which carry oxygen from your lungs to the rest of your body. This medicine may be used for other purposes; ask your health care provider or pharmacist if you have questions. COMMON BRAND NAME(S): Feraheme What should I tell my care team before I take this medication? They need to know if you have any of these conditions: Anemia not caused by low iron levels High levels of iron in the blood Magnetic resonance imaging (MRI) test scheduled An unusual or allergic reaction to iron, other medications, foods, dyes, or preservatives Pregnant or trying to get  pregnant Breastfeeding How should I use this medication? This medication is injected into a vein. It is given by your care team in a hospital or clinic setting. Talk to your care team the use of this medication in children. Special care may be needed. Overdosage: If you think you have taken too much of this medicine contact a poison control center or emergency room at once. NOTE: This medicine is only for you. Do not share this medicine with others. What if I miss a dose? It is important not to miss your dose. Call your care team if you are unable to keep an appointment. What may interact with this medication? Other iron products This list may not describe all possible interactions. Give your health care provider a list of all the medicines, herbs, non-prescription drugs, or dietary supplements you use. Also tell them if you smoke, drink alcohol, or use illegal drugs. Some items may interact with your medicine. What should I watch for while using this medication? Visit your care team for regular checks on your progress. Tell your care team if your symptoms do not start to get better or if they get worse. You may need blood work done while you are taking this medication. You may need to eat more foods that contain iron. Talk to your care team. Foods that contain iron include whole grains or cereals, dried fruits, beans, peas, leafy green vegetables, and organ meats (liver, kidney). What side effects may I notice from receiving this medication? Side effects that  you should report to your care team as soon as possible: Allergic reactions--skin rash, itching, hives, swelling of the face, lips, tongue, or throat Low blood pressure--dizziness, feeling faint or lightheaded, blurry vision Shortness of breath Side effects that usually do not require medical attention (report to your care team if they continue or are bothersome): Flushing Headache Joint pain Muscle pain Nausea Pain, redness, or  irritation at injection site This list may not describe all possible side effects. Call your doctor for medical advice about side effects. You may report side effects to FDA at 1-800-FDA-1088. Where should I keep my medication? This medication is given in a hospital or clinic. It will not be stored at home. NOTE: This sheet is a summary. It may not cover all possible information. If you have questions about this medicine, talk to your doctor, pharmacist, or health care provider.  2024 Elsevier/Gold Standard (2023-05-22 00:00:00)      To help prevent nausea and vomiting after your treatment, we encourage you to take your nausea medication as directed.  BELOW ARE SYMPTOMS THAT SHOULD BE REPORTED IMMEDIATELY: *FEVER GREATER THAN 100.4 F (38 C) OR HIGHER *CHILLS OR SWEATING *NAUSEA AND VOMITING THAT IS NOT CONTROLLED WITH YOUR NAUSEA MEDICATION *UNUSUAL SHORTNESS OF BREATH *UNUSUAL BRUISING OR BLEEDING *URINARY PROBLEMS (pain or burning when urinating, or frequent urination) *BOWEL PROBLEMS (unusual diarrhea, constipation, pain near the anus) TENDERNESS IN MOUTH AND THROAT WITH OR WITHOUT PRESENCE OF ULCERS (sore throat, sores in mouth, or a toothache) UNUSUAL RASH, SWELLING OR PAIN  UNUSUAL VAGINAL DISCHARGE OR ITCHING   Items with * indicate a potential emergency and should be followed up as soon as possible or go to the Emergency Department if any problems should occur.  Please show the CHEMOTHERAPY ALERT CARD or IMMUNOTHERAPY ALERT CARD at check-in to the Emergency Department and triage nurse.  Should you have questions after your visit or need to cancel or reschedule your appointment, please contact [facility] [place] - A DEPT OF Eligha Bridegroom [facility] [phone]  and follow the prompts.  Office hours are 8:00 a.m. to 4:30 p.m. Monday - Friday. Please note that voicemails left after 4:00 p.m. may not be returned until the following business day.  We are closed weekends  and major holidays. You have access to a nurse at all times for urgent questions. Please call the main number to the clinic [phone] and follow the prompts.  For any non-urgent questions, you may also contact your provider using MyChart. We now offer e-Visits for anyone 67 and older to request care online for non-urgent symptoms. For details visit [email or web address].   Also download the MyChart app! Go to the app store, search "MyChart", open the app, select [place], and log in with your MyChart username and password.

## 2023-12-04 ENCOUNTER — Ambulatory Visit: Payer: Medicare Other | Attending: Cardiology | Admitting: Cardiology

## 2023-12-04 ENCOUNTER — Encounter: Payer: Self-pay | Admitting: Cardiology

## 2023-12-04 ENCOUNTER — Ambulatory Visit: Payer: Medicare Other | Admitting: Cardiology

## 2023-12-04 VITALS — BP 138/78 | HR 56 | Ht 70.0 in | Wt 262.8 lb

## 2023-12-04 DIAGNOSIS — R0602 Shortness of breath: Secondary | ICD-10-CM

## 2023-12-04 DIAGNOSIS — R001 Bradycardia, unspecified: Secondary | ICD-10-CM

## 2023-12-04 DIAGNOSIS — I4819 Other persistent atrial fibrillation: Secondary | ICD-10-CM

## 2023-12-04 DIAGNOSIS — I25119 Atherosclerotic heart disease of native coronary artery with unspecified angina pectoris: Secondary | ICD-10-CM

## 2023-12-04 DIAGNOSIS — Z79899 Other long term (current) drug therapy: Secondary | ICD-10-CM | POA: Diagnosis not present

## 2023-12-04 DIAGNOSIS — R609 Edema, unspecified: Secondary | ICD-10-CM

## 2023-12-04 MED ORDER — TORSEMIDE 20 MG PO TABS: ORAL_TABLET | ORAL | 1 refills | Status: DC

## 2023-12-04 MED ORDER — POTASSIUM CHLORIDE ER 10 MEQ PO TBCR
10.0000 meq | EXTENDED_RELEASE_TABLET | Freq: Every day | ORAL | 1 refills | Status: DC
Start: 2023-12-04 — End: 2024-02-05

## 2023-12-04 NOTE — Progress Notes (Signed)
 Cardiology Office Note  Date: 12/04/2023   ID: Lucas Dudley, DOB 04-21-1941, MRN [id number]  History of Present Illness: Lucas Dudley is an 83 y.o. male last seen in August 2024.  He is here for a follow-up visit.  I also spoke with his daughter by phone during the encounter.  He reports gradually worsening dyspnea on exertion and fatigue over the last few months, weight gain of 20 pounds since our last visit in August 2024.  No exertional chest pain or sense of palpitations.  We went over his medications.  Current cardiovascular regimen includes aspirin 81 mg daily, amiodarone 200 mg daily, Coreg 3.125 mg twice daily, Lopid 600 mg daily, lisinopril 40 mg daily, and Demadex 20 mg twice daily.  Currently not on a potassium supplement.  I reviewed his recent lab work which is noted below.  Creatinine 1.45 and potassium 3.9.  I reviewed his ECG today which shows sinus bradycardia with prolonged PR interval, incomplete right bundle branch block, left anterior fascicular block, PVC.  LVEF in 2023 was 60 to 65%, RV contraction low normal at that point with moderately elevated RVSP.  Physical Exam: VS:  BP 138/78   Pulse (!) 56   Ht 5\' 10"  (1.778 m)   Wt 262 lb 12.8 oz (119.2 kg)   SpO2 93%   BMI 37.71 kg/m , BMI Body mass index is 37.71 kg/m.  Wt Readings from Last 3 Encounters:  12/04/23 262 lb 12.8 oz (119.2 kg)  12/02/23 265 lb 9.6 oz (120.5 kg)  11/28/23 260 lb 11.2 oz (118.3 kg)    General: Patient appears comfortable at rest. HEENT: Conjunctiva and lids normal. Neck: Supple, no elevated JVP or carotid bruits. Lungs: Clear to auscultation, nonlabored breathing at rest. Cardiac: Regular rate and rhythm, no S3, 1/6 systolic murmur. Abdomen: Protuberant, nontender, bowel sounds present.  ECG:  An ECG dated 11/08/2023 was personally reviewed today and demonstrated:  Sinus bradycardia with prolonged PR interval, right bundle branch block, left anterior fascicular  block.  Labwork: 11/08/2023: B Natriuretic Peptide 303.0 11/18/2023: ALT 15; AST 20; BUN 24; Creatinine, Ser 1.45; Hemoglobin 9.3; Platelets 335; Potassium 3.9; Sodium 142     Component Value Date/Time   CHOL 147 07/22/2023 1034   TRIG 213 (H) 07/22/2023 1034   HDL 24 (L) 07/22/2023 1034   CHOLHDL 6.1 (H) 07/22/2023 1034   LDLCALC 87 07/22/2023 1034   Other Studies Reviewed Today:  No interval cardiac testing for review today.  Assessment and Plan:  1.  Dyspnea on exertion and fatigue, likely multifactorial.  Question symptomatic bradycardia, recent ECGs reviewed as well as current medications.  He has also had a 20 pound weight gain since August of last year, likely some of which is fluid retention.  Plan at this time is to stop Coreg completely, increase Demadex to 40 mg twice daily for the next 3-4 days and then change standing dose to 40 mg in the morning and 20 mg in the afternoon.  Start KCl 10 mEq daily.  Check BMET in 2 weeks.  Update echocardiogram.  Office follow-up arranged.  2.  CAD with occluded ostial first diagonal and otherwise moderate LAD and PDA disease managed medically.  No obvious angina reported.  Continue aspirin 81 mg daily.   3.  Persistent atrial fibrillation with CHA2DS2-VASc score of 5 status post Watchman implantation by Dr. Excell Seltzer.  He is not on anticoagulation.   4.  Primary hypertension.  Continue lisinopril 40 mg daily.  5.  Mixed hyperlipidemia.  Continue Lopid 600 mg daily.  Disposition:  Follow up  4 to 6 weeks.  Signed, Jonelle Sidle, M.D., F.A.C.C. [facility] at [facility]

## 2023-12-04 NOTE — Patient Instructions (Addendum)
 Medication Instructions:  Your physician has recommended you make the following change in your medication:  Please Stop Carvedilol  For the next 3-4 days please Increase Torsemide to 40 Mg twice daily(looking to get 10 lbs off), then reduce to 40 Mg in the morning and 20 Mg in the evening  Please start Potassium at 10 MeQ daily   Labwork: In 2 weeks at [facility]   Testing/Procedures: Your physician has requested that you have an echocardiogram. Echocardiography is a painless test that uses sound waves to create images of your heart. It provides your doctor with information about the size and shape of your heart and how well your heart's chambers and valves are working. This procedure takes approximately one hour. There are no restrictions for this procedure. Please do NOT wear cologne, perfume, aftershave, or lotions (deodorant is allowed). Please arrive 15 minutes prior to your appointment time.  Please note: We ask at that you not bring children with you during ultrasound (echo/ vascular) testing. Due to room size and safety concerns, children are not allowed in the ultrasound rooms during exams. Our front office staff cannot provide observation of children in our lobby area while testing is being conducted. An adult accompanying a patient to their appointment will only be allowed in the ultrasound room at the discretion of the ultrasound technician under special circumstances. We apologize for any inconvenience.  Follow-Up: Your physician recommends that you schedule a follow-up appointment in: 4-6 weeks   Any Other Special Instructions Will Be Listed Below (If Applicable).  If you need a refill on your cardiac medications before your next appointment, please call your pharmacy.

## 2023-12-09 ENCOUNTER — Inpatient Hospital Stay: Payer: Medicare Other

## 2023-12-09 VITALS — BP 147/68 | HR 52 | Temp 97.7°F | Resp 17 | Wt 258.2 lb

## 2023-12-09 DIAGNOSIS — D509 Iron deficiency anemia, unspecified: Secondary | ICD-10-CM | POA: Diagnosis not present

## 2023-12-09 DIAGNOSIS — D500 Iron deficiency anemia secondary to blood loss (chronic): Secondary | ICD-10-CM

## 2023-12-09 MED ORDER — SODIUM CHLORIDE 0.9 % IV SOLN
510.0000 mg | Freq: Once | INTRAVENOUS | Status: AC
  Administered 2023-12-09: 510 mg via INTRAVENOUS
  Filled 2023-12-09: qty 17

## 2023-12-09 MED ORDER — SODIUM CHLORIDE 0.9 % IV SOLN
INTRAVENOUS | Status: DC
Start: 2023-12-09 — End: 2023-12-09

## 2023-12-09 NOTE — Progress Notes (Signed)
 Patient presents today for Feraheme infusion per providers order.  Vital signs WNL.  Patient states he took premedications at home at 9:00.   Peripheral IV started and blood return noted pre and post infusion.  Stable during infusion without adverse affects.  Vital signs stable.  No complaints at this time.  Discharge from clinic ambulatory in stable condition.  Alert and oriented X 3.  Follow up with [facility] as scheduled.

## 2023-12-09 NOTE — Patient Instructions (Signed)

## 2023-12-10 ENCOUNTER — Inpatient Hospital Stay: Payer: Medicare Other

## 2023-12-11 ENCOUNTER — Encounter: Payer: Self-pay | Admitting: Cardiology

## 2023-12-11 DIAGNOSIS — Z79899 Other long term (current) drug therapy: Secondary | ICD-10-CM

## 2023-12-11 DIAGNOSIS — I4819 Other persistent atrial fibrillation: Secondary | ICD-10-CM

## 2023-12-12 NOTE — Addendum Note (Signed)
 Addended by: Eustace Moore on: 12/12/2023 09:02 AM   Modules accepted: Orders

## 2023-12-14 ENCOUNTER — Emergency Department (HOSPITAL_COMMUNITY)

## 2023-12-14 ENCOUNTER — Other Ambulatory Visit: Payer: Self-pay

## 2023-12-14 ENCOUNTER — Encounter (HOSPITAL_COMMUNITY): Payer: Self-pay | Admitting: *Deleted

## 2023-12-14 ENCOUNTER — Inpatient Hospital Stay (HOSPITAL_COMMUNITY)
Admission: EM | Admit: 2023-12-14 | Discharge: 2023-12-17 | DRG: 291 | Disposition: A | Discharge status: Home / Self Care | Attending: Internal Medicine | Admitting: Internal Medicine

## 2023-12-14 DIAGNOSIS — I5042 Chronic combined systolic (congestive) and diastolic (congestive) heart failure: Principal | ICD-10-CM | POA: Insufficient documentation

## 2023-12-14 DIAGNOSIS — I480 Paroxysmal atrial fibrillation: Secondary | ICD-10-CM | POA: Diagnosis present

## 2023-12-14 DIAGNOSIS — I509 Heart failure, unspecified: Principal | ICD-10-CM

## 2023-12-14 DIAGNOSIS — H409 Unspecified glaucoma: Secondary | ICD-10-CM | POA: Diagnosis present

## 2023-12-14 DIAGNOSIS — R7989 Other specified abnormal findings of blood chemistry: Secondary | ICD-10-CM | POA: Diagnosis present

## 2023-12-14 DIAGNOSIS — I5043 Acute on chronic combined systolic (congestive) and diastolic (congestive) heart failure: Secondary | ICD-10-CM | POA: Diagnosis present

## 2023-12-14 DIAGNOSIS — I130 Hypertensive heart and chronic kidney disease with heart failure and stage 1 through stage 4 chronic kidney disease, or unspecified chronic kidney disease: Secondary | ICD-10-CM | POA: Diagnosis present

## 2023-12-14 DIAGNOSIS — Z8249 Family history of ischemic heart disease and other diseases of the circulatory system: Secondary | ICD-10-CM | POA: Diagnosis not present

## 2023-12-14 DIAGNOSIS — I10 Essential (primary) hypertension: Secondary | ICD-10-CM | POA: Diagnosis not present

## 2023-12-14 DIAGNOSIS — G8929 Other chronic pain: Secondary | ICD-10-CM | POA: Diagnosis present

## 2023-12-14 DIAGNOSIS — Z800 Family history of malignant neoplasm of digestive organs: Secondary | ICD-10-CM

## 2023-12-14 DIAGNOSIS — I428 Other cardiomyopathies: Secondary | ICD-10-CM | POA: Diagnosis present

## 2023-12-14 DIAGNOSIS — H401130 Primary open-angle glaucoma, bilateral, stage unspecified: Secondary | ICD-10-CM | POA: Diagnosis present

## 2023-12-14 DIAGNOSIS — N1831 Chronic kidney disease, stage 3a: Secondary | ICD-10-CM | POA: Diagnosis present

## 2023-12-14 DIAGNOSIS — M549 Dorsalgia, unspecified: Secondary | ICD-10-CM | POA: Diagnosis present

## 2023-12-14 DIAGNOSIS — Z881 Allergy status to other antibiotic agents status: Secondary | ICD-10-CM

## 2023-12-14 DIAGNOSIS — M109 Gout, unspecified: Secondary | ICD-10-CM | POA: Diagnosis present

## 2023-12-14 DIAGNOSIS — Z79899 Other long term (current) drug therapy: Secondary | ICD-10-CM | POA: Diagnosis not present

## 2023-12-14 DIAGNOSIS — J9601 Acute respiratory failure with hypoxia: Secondary | ICD-10-CM | POA: Diagnosis present

## 2023-12-14 DIAGNOSIS — Z823 Family history of stroke: Secondary | ICD-10-CM

## 2023-12-14 DIAGNOSIS — I2510 Atherosclerotic heart disease of native coronary artery without angina pectoris: Secondary | ICD-10-CM | POA: Diagnosis present

## 2023-12-14 DIAGNOSIS — K5520 Angiodysplasia of colon without hemorrhage: Secondary | ICD-10-CM | POA: Diagnosis not present

## 2023-12-14 DIAGNOSIS — E66812 Obesity, class 2: Secondary | ICD-10-CM | POA: Diagnosis not present

## 2023-12-14 DIAGNOSIS — E785 Hyperlipidemia, unspecified: Secondary | ICD-10-CM | POA: Diagnosis present

## 2023-12-14 DIAGNOSIS — R918 Other nonspecific abnormal finding of lung field: Secondary | ICD-10-CM | POA: Diagnosis not present

## 2023-12-14 DIAGNOSIS — E876 Hypokalemia: Secondary | ICD-10-CM | POA: Diagnosis present

## 2023-12-14 DIAGNOSIS — Z8546 Personal history of malignant neoplasm of prostate: Secondary | ICD-10-CM

## 2023-12-14 DIAGNOSIS — E1169 Type 2 diabetes mellitus with other specified complication: Secondary | ICD-10-CM | POA: Diagnosis present

## 2023-12-14 DIAGNOSIS — Z7982 Long term (current) use of aspirin: Secondary | ICD-10-CM | POA: Diagnosis not present

## 2023-12-14 DIAGNOSIS — Z801 Family history of malignant neoplasm of trachea, bronchus and lung: Secondary | ICD-10-CM

## 2023-12-14 DIAGNOSIS — Z87891 Personal history of nicotine dependence: Secondary | ICD-10-CM

## 2023-12-14 DIAGNOSIS — Z91119 Patient's noncompliance with dietary regimen due to unspecified reason: Secondary | ICD-10-CM | POA: Diagnosis not present

## 2023-12-14 DIAGNOSIS — Z95818 Presence of other cardiac implants and grafts: Secondary | ICD-10-CM

## 2023-12-14 DIAGNOSIS — Z860100 Personal history of colon polyps, unspecified: Secondary | ICD-10-CM

## 2023-12-14 DIAGNOSIS — Z1152 Encounter for screening for COVID-19: Secondary | ICD-10-CM

## 2023-12-14 DIAGNOSIS — G4733 Obstructive sleep apnea (adult) (pediatric): Secondary | ICD-10-CM

## 2023-12-14 DIAGNOSIS — Z794 Long term (current) use of insulin: Secondary | ICD-10-CM | POA: Diagnosis not present

## 2023-12-14 DIAGNOSIS — Z6835 Body mass index (BMI) 35.0-35.9, adult: Secondary | ICD-10-CM | POA: Diagnosis not present

## 2023-12-14 DIAGNOSIS — Z85820 Personal history of malignant melanoma of skin: Secondary | ICD-10-CM

## 2023-12-14 DIAGNOSIS — E113293 Type 2 diabetes mellitus with mild nonproliferative diabetic retinopathy without macular edema, bilateral: Secondary | ICD-10-CM | POA: Diagnosis present

## 2023-12-14 DIAGNOSIS — Z7984 Long term (current) use of oral hypoglycemic drugs: Secondary | ICD-10-CM

## 2023-12-14 DIAGNOSIS — E1122 Type 2 diabetes mellitus with diabetic chronic kidney disease: Secondary | ICD-10-CM | POA: Diagnosis present

## 2023-12-14 DIAGNOSIS — Z833 Family history of diabetes mellitus: Secondary | ICD-10-CM

## 2023-12-14 DIAGNOSIS — Z91048 Other nonmedicinal substance allergy status: Secondary | ICD-10-CM

## 2023-12-14 HISTORY — DX: Disorders of diaphragm: J98.6

## 2023-12-14 LAB — BASIC METABOLIC PANEL
Anion gap: 14 (ref 5–15)
BUN: 21 mg/dL (ref 8–23)
CO2: 31 mmol/L (ref 22–32)
Calcium: 9.1 mg/dL (ref 8.9–10.3)
Chloride: 95 mmol/L — ABNORMAL LOW (ref 98–111)
Creatinine, Ser: 1.4 mg/dL — ABNORMAL HIGH (ref 0.61–1.24)
GFR, Estimated: 51 mL/min — ABNORMAL LOW (ref >60)
Glucose, Bld: 113 mg/dL — ABNORMAL HIGH (ref 70–99)
Potassium: 3.4 mmol/L — ABNORMAL LOW (ref 3.5–5.1)
Sodium: 140 mmol/L (ref 135–145)

## 2023-12-14 LAB — CBC
HCT: 37.6 % — ABNORMAL LOW (ref 39.0–52.0)
Hemoglobin: 11.3 g/dL — ABNORMAL LOW (ref 13.0–17.0)
MCH: 29.8 pg (ref 26.0–34.0)
MCHC: 30.1 g/dL (ref 30.0–36.0)
MCV: 99.2 fL (ref 80.0–100.0)
Platelets: 186 10*3/uL (ref 150–400)
RBC: 3.79 MIL/uL — ABNORMAL LOW (ref 4.22–5.81)
RDW: 23 % — ABNORMAL HIGH (ref 11.5–15.5)
WBC: 11.5 10*3/uL — ABNORMAL HIGH (ref 4.0–10.5)
nRBC: 0 % (ref 0.0–0.2)

## 2023-12-14 LAB — HEPATIC FUNCTION PANEL
ALT: 15 U/L (ref 0–44)
AST: 21 U/L (ref 15–41)
Albumin: 4.2 g/dL (ref 3.5–5.0)
Alkaline Phosphatase: 53 U/L (ref 38–126)
Bilirubin, Direct: 0.1 mg/dL (ref 0.0–0.2)
Indirect Bilirubin: 0.8 mg/dL (ref 0.3–0.9)
Total Bilirubin: 0.9 mg/dL (ref 0.0–1.2)
Total Protein: 7.7 g/dL (ref 6.5–8.1)

## 2023-12-14 LAB — RESP PANEL BY RT-PCR (RSV, FLU A&B, COVID)  RVPGX2
Influenza A by PCR: NEGATIVE
Influenza B by PCR: NEGATIVE
Resp Syncytial Virus by PCR: NEGATIVE
SARS Coronavirus 2 by RT PCR: NEGATIVE

## 2023-12-14 LAB — TROPONIN I (HIGH SENSITIVITY)
Troponin I (High Sensitivity): 23 ng/L — ABNORMAL HIGH (ref <18)
Troponin I (High Sensitivity): 24 ng/L — ABNORMAL HIGH (ref <18)

## 2023-12-14 LAB — MAGNESIUM: Magnesium: 1.9 mg/dL (ref 1.7–2.4)

## 2023-12-14 LAB — BRAIN NATRIURETIC PEPTIDE: B Natriuretic Peptide: 249 pg/mL — ABNORMAL HIGH (ref 0.0–100.0)

## 2023-12-14 LAB — GLUCOSE, CAPILLARY: Glucose-Capillary: 165 mg/dL — ABNORMAL HIGH (ref 70–99)

## 2023-12-14 MED ORDER — FERROUS SULFATE 325 (65 FE) MG PO TABS
325.0000 mg | ORAL_TABLET | Freq: Every day | ORAL | Status: DC
  Administered 2023-12-15: 325 mg via ORAL
  Filled 2023-12-14: qty 1

## 2023-12-14 MED ORDER — FUROSEMIDE 10 MG/ML IJ SOLN
40.0000 mg | Freq: Two times a day (BID) | INTRAMUSCULAR | Status: DC
  Filled 2023-12-14: qty 4

## 2023-12-14 MED ORDER — FLUTICASONE PROPIONATE 50 MCG/ACT NA SUSP
2.0000 | Freq: Every day | NASAL | Status: DC
  Administered 2023-12-15 – 2023-12-17 (×3): 2 via NASAL
  Filled 2023-12-14 (×2): qty 16

## 2023-12-14 MED ORDER — ACETAMINOPHEN 650 MG RE SUPP: 650.0000 mg | Freq: Four times a day (QID) | RECTAL | Status: DC | PRN

## 2023-12-14 MED ORDER — INSULIN ASPART 100 UNIT/ML IJ SOLN
0.0000 [IU] | Freq: Every day | INTRAMUSCULAR | Status: DC
Start: 2023-12-14 — End: 2023-12-17
  Administered 2023-12-15: 3 [IU] via SUBCUTANEOUS
  Administered 2023-12-16: 4 [IU] via SUBCUTANEOUS

## 2023-12-14 MED ORDER — INSULIN GLARGINE-YFGN 100 UNIT/ML ~~LOC~~ SOLN
10.0000 [IU] | Freq: Every day | SUBCUTANEOUS | Status: DC
  Administered 2023-12-14 – 2023-12-16 (×3): 10 [IU] via SUBCUTANEOUS
  Filled 2023-12-14 (×4): qty 0.1

## 2023-12-14 MED ORDER — ONDANSETRON HCL 4 MG PO TABS: 4.0000 mg | ORAL_TABLET | Freq: Four times a day (QID) | ORAL | Status: DC | PRN

## 2023-12-14 MED ORDER — ENOXAPARIN SODIUM 40 MG/0.4ML IJ SOSY: 40.0000 mg | PREFILLED_SYRINGE | INTRAMUSCULAR | Status: DC

## 2023-12-14 MED ORDER — ENOXAPARIN SODIUM 60 MG/0.6ML IJ SOSY
0.5000 mg/kg | PREFILLED_SYRINGE | INTRAMUSCULAR | Status: DC
  Administered 2023-12-14 – 2023-12-15 (×2): 57.5 mg via SUBCUTANEOUS
  Filled 2023-12-14 (×2): qty 0.6

## 2023-12-14 MED ORDER — ONDANSETRON HCL 4 MG/2ML IJ SOLN: 4.0000 mg | Freq: Four times a day (QID) | INTRAMUSCULAR | Status: DC | PRN

## 2023-12-14 MED ORDER — FUROSEMIDE 10 MG/ML IJ SOLN
40.0000 mg | Freq: Once | INTRAMUSCULAR | Status: AC
  Administered 2023-12-14: 40 mg via INTRAVENOUS
  Filled 2023-12-14: qty 4

## 2023-12-14 MED ORDER — INSULIN GLARGINE-YFGN 100 UNIT/ML ~~LOC~~ SOPN: 10.0000 [IU] | PEN_INJECTOR | Freq: Every day | SUBCUTANEOUS | Status: DC

## 2023-12-14 MED ORDER — ASPIRIN 81 MG PO CHEW
81.0000 mg | CHEWABLE_TABLET | Freq: Every day | ORAL | Status: DC
  Administered 2023-12-15 – 2023-12-17 (×3): 81 mg via ORAL
  Filled 2023-12-14 (×3): qty 1

## 2023-12-14 MED ORDER — GEMFIBROZIL 600 MG PO TABS
600.0000 mg | ORAL_TABLET | Freq: Every day | ORAL | Status: DC
  Administered 2023-12-14 – 2023-12-16 (×3): 600 mg via ORAL
  Filled 2023-12-14 (×3): qty 1

## 2023-12-14 MED ORDER — INSULIN ASPART 100 UNIT/ML IJ SOLN
0.0000 [IU] | Freq: Three times a day (TID) | INTRAMUSCULAR | Status: DC
Start: 2023-12-15 — End: 2023-12-17
  Administered 2023-12-15 (×2): 5 [IU] via SUBCUTANEOUS
  Administered 2023-12-16: 8 [IU] via SUBCUTANEOUS
  Administered 2023-12-16: 5 [IU] via SUBCUTANEOUS
  Administered 2023-12-16 – 2023-12-17 (×2): 3 [IU] via SUBCUTANEOUS

## 2023-12-14 MED ORDER — POTASSIUM CHLORIDE CRYS ER 20 MEQ PO TBCR
40.0000 meq | EXTENDED_RELEASE_TABLET | ORAL | Status: AC
  Administered 2023-12-14 (×2): 40 meq via ORAL
  Filled 2023-12-14 (×2): qty 2

## 2023-12-14 MED ORDER — NITROGLYCERIN 2 % TD OINT
1.0000 [in_us] | TOPICAL_OINTMENT | Freq: Once | TRANSDERMAL | Status: AC
  Administered 2023-12-14: 1 [in_us] via TOPICAL
  Filled 2023-12-14: qty 1

## 2023-12-14 MED ORDER — PANTOPRAZOLE SODIUM 40 MG PO TBEC
40.0000 mg | DELAYED_RELEASE_TABLET | Freq: Every day | ORAL | Status: DC
  Administered 2023-12-15 – 2023-12-17 (×3): 40 mg via ORAL
  Filled 2023-12-14 (×3): qty 1

## 2023-12-14 MED ORDER — POLYETHYLENE GLYCOL 3350 17 G PO PACK
17.0000 g | PACK | Freq: Every day | ORAL | Status: DC | PRN
  Administered 2023-12-16: 17 g via ORAL
  Filled 2023-12-14: qty 1

## 2023-12-14 MED ORDER — AMIODARONE HCL 200 MG PO TABS
200.0000 mg | ORAL_TABLET | Freq: Every day | ORAL | Status: DC
  Administered 2023-12-14 – 2023-12-16 (×3): 200 mg via ORAL
  Filled 2023-12-14 (×3): qty 1

## 2023-12-14 MED ORDER — INSULIN GLARGINE 100 UNIT/ML ~~LOC~~ SOLN
10.0000 [IU] | Freq: Every day | SUBCUTANEOUS | Status: DC
  Filled 2023-12-14: qty 0.1

## 2023-12-14 MED ORDER — IOHEXOL 350 MG/ML SOLN
75.0000 mL | Freq: Once | INTRAVENOUS | Status: AC | PRN
  Administered 2023-12-14: 75 mL via INTRAVENOUS

## 2023-12-14 MED ORDER — ACETAMINOPHEN 325 MG PO TABS: 650.0000 mg | ORAL_TABLET | Freq: Four times a day (QID) | ORAL | Status: DC | PRN

## 2023-12-14 NOTE — Assessment & Plan Note (Addendum)
 On admission. O2 sats down to 85% on room air, currently on 3 L sats 91%.  Likely secondary to decompensated CHF, with CTA negative for PE, showing bilateral pleural effusions.  Clear lung exam.  COVID influenza RSV negative.  Quit smoking cigarettes almost 30 years ago.  12-15-2023 on my review of CTPA. Pt with minimal pleural effusions. Effusions not the cause of his hypoxia. 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  12-16-2023 weaned to RA.

## 2023-12-14 NOTE — ED Notes (Signed)
 ED TO INPATIENT HANDOFF REPORT  ED Nurse Name and Phone #: Haze Justin [phone]  S Name/Age/Gender Lauralyn Primes 83 y.o. male Room/Bed: APA12/APA12  Code Status   Code Status: Prior  Home/SNF/Other Home Patient oriented to: self, place, time, and situation Is this baseline? Yes   Triage Complete: Triage complete  Chief Complaint Acute hypoxic respiratory failure (HCC) [J96.01]  Triage Note Pt with generalized chest pain, worse with deep breath.  Pain started this morning per pt. Denies any SOB or N/V   Allergies Allergies  Allergen Reactions   Azithromycin Nausea Only   Tape Rash and Other (See Comments)    Causes skin redness, Use paper tape only.    Level of Care/Admitting Diagnosis ED Disposition     ED Disposition  Admit   Condition  --   Comment  Hospital Area: [facility] [[id number]]  Level of Care: Telemetry [5]  Covid Evaluation: Asymptomatic - no recent exposure (last 10 days) testing not required  Diagnosis: Acute hypoxic respiratory failure [facility]) [[id number]]  Admitting Physician: Onnie Boer [phone]  Attending Physician: Onnie Boer Xenia.Katrisha Segall          B Medical/Surgery History Past Medical History:  Diagnosis Date   Arthritis    Atrial fibrillation (HCC)    CAD (coronary artery disease)    a. Cath 03/17/15 showing 100% ostial D1, 50% prox LAD to mid LAD, 40% RPDA stenosis. Med rx. // Myoview 01/2020: EF 31 diffuse perfusion defect without reversibility (suspect artifact); reviewed with Dr. Tama High study felt to be low risk   Cataract    Mixed form OD   Chronic diastolic CHF (congestive heart failure) (HCC)    Diabetic retinopathy (HCC)    NPDR OU   Essential hypertension    Glaucoma    POAG OU   History of gout    Hyperlipidemia    Hypertensive retinopathy    OU   Kidney stones    Melanoma of neck (HCC)    NICM (nonischemic cardiomyopathy) (HCC)    OSA on CPAP 2012   Prostate cancer (HCC)    Type II  diabetes mellitus (HCC)    Past Surgical History:  Procedure Laterality Date   ABDOMINAL HERNIA REPAIR     w/mesh   ATRIAL FIBRILLATION ABLATION N/A 06/06/2021   Procedure: ATRIAL FIBRILLATION ABLATION;  Surgeon: Hillis Range, MD;  [facility]: [facility] INVASIVE CV LAB;  Service: Cardiovascular;  Laterality: N/A;   BIOPSY  05/24/2022   Procedure: BIOPSY;  Surgeon: Corbin Ade, MD;  [facility];  Service: Endoscopy;;   BIOPSY  05/30/2022   Procedure: BIOPSY;  Surgeon: Corbin Ade, MD;  [facility];  Service: Endoscopy;;   BIOPSY  01/10/2023   Procedure: BIOPSY;  Surgeon: Lemar Lofty., MD;  Location: [facility] ENDOSCOPY;  Service: Gastroenterology;;   CARDIAC CATHETERIZATION N/A 03/17/2015   Procedure: Left Heart Cath and Coronary Angiography;  Surgeon: Runell Gess, MD;  Location: [facility] INVASIVE CV LAB;  Service: Cardiovascular;  Laterality: N/A;   CARDIOVERSION N/A 08/09/2021   Procedure: CARDIOVERSION;  Surgeon: Chilton Si, MD;  Location: [facility] ENDOSCOPY;  Service: Cardiovascular;  Laterality: N/A;   CARDIOVERSION N/A 11/10/2021   Procedure: CARDIOVERSION;  Surgeon: Thomasene Ripple, DO;  Location: [facility] ENDOSCOPY;  Service: Cardiovascular;  Laterality: N/A;   carotid doppler  09/17/2008   rigt and left ICAs 0-49%;mildly  abnormal   CATARACT EXTRACTION Left 2020   Dr. Alben Spittle   COLONOSCOPY N/A 05/16/2017   Procedure:  COLONOSCOPY;  Surgeon: Malissa Hippo, MD;  [facility];  Service: Endoscopy;  Laterality: N/A;  930   COLONOSCOPY WITH PROPOFOL N/A 05/24/2022   Procedure: COLONOSCOPY WITH PROPOFOL;  Surgeon: Corbin Ade, MD;  [facility];  Service: Endoscopy;  Laterality: N/A;   DOPPLER ECHOCARDIOGRAPHY  05/25/2009   EF 50-55%,LA mildly dilated, LV function normal   ELECTROPHYSIOLOGIC STUDY N/A 04/05/2015   Procedure: Cardioversion;  Surgeon: Thurmon Fair, MD;  Location: [facility] INVASIVE CV LAB;  Service: Cardiovascular;  Laterality: N/A;    ELECTROPHYSIOLOGIC STUDY N/A 09/06/2015   Procedure: Atrial Fibrillation Ablation;  Surgeon: Hillis Range, MD;  Location: [facility] INVASIVE CV LAB;  Service: Cardiovascular;  Laterality: N/A;   ELECTROPHYSIOLOGIC STUDY N/A 07/12/2016   redo afib ablation by Dr Johney Frame   ENTEROSCOPY N/A 05/30/2022   Procedure: ENTEROSCOPY;  Surgeon: Corbin Ade, MD;  [facility];  Service: Endoscopy;  Laterality: N/A;   ESOPHAGOGASTRODUODENOSCOPY (EGD) WITH PROPOFOL N/A 05/24/2022   Procedure: ESOPHAGOGASTRODUODENOSCOPY (EGD) WITH PROPOFOL;  Surgeon: Corbin Ade, MD;  [facility];  Service: Endoscopy;  Laterality: N/A;   ESOPHAGOGASTRODUODENOSCOPY (EGD) WITH PROPOFOL N/A 01/10/2023   Procedure: ESOPHAGOGASTRODUODENOSCOPY (EGD) WITH PROPOFOL;  Surgeon: Meridee Score Netty Starring., MD;  Location: [facility] ENDOSCOPY;  Service: Gastroenterology;  Laterality: N/A;   EUS N/A 01/10/2023   Procedure: UPPER ENDOSCOPIC ULTRASOUND (EUS) RADIAL;  Surgeon: Lemar Lofty., MD;  Location: [facility] ENDOSCOPY;  Service: Gastroenterology;  Laterality: N/A;   EXCISIONAL HEMORRHOIDECTOMY     "inside and out"   EYE SURGERY Left 2020   Cat Sx - Dr. Alben Spittle   FINE NEEDLE ASPIRATION Right    knee; "drew ~ 1 quart off"   GIVENS CAPSULE STUDY N/A 05/25/2022   Procedure: GIVENS CAPSULE STUDY;  Surgeon: Lanelle Bal, DO;  [facility];  Service: Endoscopy;  Laterality: N/A;   GIVENS CAPSULE STUDY N/A 01/23/2023   Procedure: GIVENS CAPSULE STUDY;  Surgeon: Dolores Frame, MD;  [facility];  Service: Gastroenterology;  Laterality: N/A;  8:30am   HERNIA REPAIR     HOT HEMOSTASIS N/A 01/10/2023   Procedure: HOT HEMOSTASIS (ARGON PLASMA COAGULATION/BICAP);  Surgeon: Lemar Lofty., MD;  Location: [facility] ENDOSCOPY;  Service: Gastroenterology;  Laterality: N/A;   LAPAROSCOPIC CHOLECYSTECTOMY     LEFT ATRIAL APPENDAGE OCCLUSION N/A 06/28/2022   Procedure: LEFT ATRIAL APPENDAGE OCCLUSION;   Surgeon: Tonny Bollman, MD;  Location: [facility] INVASIVE CV LAB;  Service: Cardiovascular;  Laterality: N/A;   MELANOMA EXCISION Right    "neck"   NM MYOCAR PERF WALL MOTION  02/21/2012   EF 61% ,EXERCISE 7 METS. exercise stopped due to wheezing and shortness of breathe   POLYPECTOMY  05/16/2017   Procedure: POLYPECTOMY;  Surgeon: Malissa Hippo, MD;  [facility];  Service: Endoscopy;;  colon   POLYPECTOMY  05/24/2022   Procedure: POLYPECTOMY;  Surgeon: Corbin Ade, MD;  [facility];  Service: Endoscopy;;   POLYPECTOMY  01/10/2023   Procedure: POLYPECTOMY;  Surgeon: Meridee Score Netty Starring., MD;  Location: [facility] ENDOSCOPY;  Service: Gastroenterology;;   PROSTATECTOMY     SHOULDER OPEN ROTATOR CUFF REPAIR Right X 2   SUBMUCOSAL TATTOO INJECTION  05/30/2022   Procedure: SUBMUCOSAL TATTOO INJECTION;  Surgeon: Corbin Ade, MD;  [facility];  Service: Endoscopy;;   SUBMUCOSAL TATTOO INJECTION  01/10/2023   Procedure: SUBMUCOSAL TATTOO INJECTION;  Surgeon: Lemar Lofty., MD;  Location: [facility] ENDOSCOPY;  Service: Gastroenterology;;   TEE WITHOUT CARDIOVERSION N/A 09/05/2015   Procedure: TRANSESOPHAGEAL ECHOCARDIOGRAM (TEE);  Surgeon: Quintella Reichert, MD;  Location: [facility] ENDOSCOPY;  Service: Cardiovascular;  Laterality: N/A;   TEE WITHOUT CARDIOVERSION N/A 06/28/2022   Procedure: TRANSESOPHAGEAL ECHOCARDIOGRAM (TEE);  Surgeon: Tonny Bollman, MD;  Location: [facility] INVASIVE CV LAB;  Service: Cardiovascular;  Laterality: N/A;     A IV Location/Drains/Wounds Patient Lines/Drains/Airways Status     Active Line/Drains/Airways     Name Placement date Placement time Site Days   Peripheral IV 12/14/23 20 G 1.88" Anterior;Left Forearm 12/14/23  1344  Forearm  less than 1            Intake/Output Last 24 hours No intake or output data in the 24 hours ending 12/14/23 1710  Labs/Imaging Results for orders placed or performed during the hospital encounter of  12/14/23 (from the past 48 hours)  Basic metabolic panel     Status: Abnormal   Collection Time: 12/14/23  1:56 PM  Result Value Ref Range   Sodium 140 135 - 145 mmol/L   Potassium 3.4 (L) 3.5 - 5.1 mmol/L   Chloride 95 (L) 98 - 111 mmol/L   CO2 31 22 - 32 mmol/L   Glucose, Bld 113 (H) 70 - 99 mg/dL    Comment: Glucose reference range applies only to samples taken after fasting for at least 8 hours.   BUN 21 8 - 23 mg/dL   Creatinine, Ser 1.61 (H) 0.61 - 1.24 mg/dL   Calcium 9.1 8.9 - 09.6 mg/dL   GFR, Estimated 51 (L) >60 mL/min    Comment: (NOTE) Calculated using the CKD-EPI Creatinine Equation (2021)    Anion gap 14 5 - 15    Comment: Performed at [facility], [address] [place], [place] [address]  CBC     Status: Abnormal   Collection Time: 12/14/23  1:56 PM  Result Value Ref Range   WBC 11.5 (H) 4.0 - 10.5 K/uL   RBC 3.79 (L) 4.22 - 5.81 MIL/uL   Hemoglobin 11.3 (L) 13.0 - 17.0 g/dL   HCT 98.1 (L) 19.1 - 47.8 %   MCV 99.2 80.0 - 100.0 fL   MCH 29.8 26.0 - 34.0 pg   MCHC 30.1 30.0 - 36.0 g/dL   RDW 29.5 (H) 62.1 - 30.8 %   Platelets 186 150 - 400 K/uL   nRBC 0.0 0.0 - 0.2 %    Comment: Performed at [facility], [address], [place], [place] [address]  Troponin I (High Sensitivity)     Status: Abnormal   Collection Time: 12/14/23  1:56 PM  Result Value Ref Range   Troponin I (High Sensitivity) 23 (H) <18 ng/L    Comment: (NOTE) Elevated high sensitivity troponin I (hsTnI) values and significant  changes across serial measurements may suggest ACS but many other  chronic and acute conditions are known to elevate hsTnI results.  Refer to the "Links" section for chest pain algorithms and additional  guidance. Performed at [facility], [address], [place], [place] [address]   Hepatic function panel     Status: None   Collection Time: 12/14/23  1:56 PM  Result Value Ref Range   Total Protein 7.7 6.5 - 8.1 g/dL   Albumin 4.2 3.5 - 5.0 g/dL   AST 21 15 - 41  U/L   ALT 15 0 - 44 U/L   Alkaline Phosphatase 53 38 - 126 U/L   Total Bilirubin 0.9 0.0 -  1.2 mg/dL   Bilirubin, Direct 0.1 0.0 - 0.2 mg/dL   Indirect Bilirubin 0.8 0.3 - 0.9 mg/dL    Comment: Performed at [facility], [address] [place], [place] [address]  Brain natriuretic peptide     Status: Abnormal   Collection Time: 12/14/23  1:56 PM  Result Value Ref Range   B Natriuretic Peptide 249.0 (H) 0.0 - 100.0 pg/mL    Comment: Performed at [facility], [address], [place], [place] [address]  Resp panel by RT-PCR (RSV, Flu A&B, Covid) Anterior Nasal Swab     Status: None   Collection Time: 12/14/23  2:52 PM   Specimen: Anterior Nasal Swab  Result Value Ref Range   SARS Coronavirus 2 by RT PCR NEGATIVE NEGATIVE    Comment: (NOTE) SARS-CoV-2 target nucleic acids are NOT DETECTED.  The SARS-CoV-2 RNA is generally detectable in upper respiratory specimens during the acute phase of infection. The lowest concentration of SARS-CoV-2 viral copies this assay can detect is 138 copies/mL. A negative result does not preclude SARS-Cov-2 infection and should not be used as the sole basis for treatment or other patient management decisions. A negative result may occur with  improper specimen collection/handling, submission of specimen other than nasopharyngeal swab, presence of viral mutation(s) within the areas targeted by this assay, and inadequate number of viral copies(<138 copies/mL). A negative result must be combined with clinical observations, patient history, and epidemiological information. The expected result is Negative.  Fact Sheet for Patients:  BloggerCourse.com  Fact Sheet for Healthcare Providers:  SeriousBroker.it  This test is no t yet approved or cleared by the [place] FDA and  has been authorized for detection and/or diagnosis of SARS-CoV-2 by FDA under an Emergency Use Authorization (EUA). This EUA will  remain  in effect (meaning this test can be used) for the duration of the COVID-19 declaration under Section 564(b)(1) of the Act, 21 U.S.C.section 360bbb-3(b)(1), unless the authorization is terminated  or revoked sooner.       Influenza A by PCR NEGATIVE NEGATIVE   Influenza B by PCR NEGATIVE NEGATIVE    Comment: (NOTE) The Xpert Xpress SARS-CoV-2/FLU/RSV plus assay is intended as an aid in the diagnosis of influenza from Nasopharyngeal swab specimens and should not be used as a sole basis for treatment. Nasal washings and aspirates are unacceptable for Xpert Xpress SARS-CoV-2/FLU/RSV testing.  Fact Sheet for Patients: BloggerCourse.com  Fact Sheet for Healthcare Providers: SeriousBroker.it  This test is not yet approved or cleared by the [place] FDA and has been authorized for detection and/or diagnosis of SARS-CoV-2 by FDA under an Emergency Use Authorization (EUA). This EUA will remain in effect (meaning this test can be used) for the duration of the COVID-19 declaration under Section 564(b)(1) of the Act, 21 U.S.C. section 360bbb-3(b)(1), unless the authorization is terminated or revoked.     Resp Syncytial Virus by PCR NEGATIVE NEGATIVE    Comment: (NOTE) Fact Sheet for Patients: BloggerCourse.com  Fact Sheet for Healthcare Providers: SeriousBroker.it  This test is not yet approved or cleared by the [place] FDA and has been authorized for detection and/or diagnosis of SARS-CoV-2 by FDA under an Emergency Use Authorization (EUA). This EUA will remain in effect (meaning this test can be used) for the duration of the COVID-19 declaration under Section 564(b)(1) of the Act, 21 U.S.C. section 360bbb-3(b)(1), unless the authorization is terminated or revoked.  Performed at [facility], [address], [place], [place] [address]   Troponin I (  High  Sensitivity)     Status: Abnormal   Collection Time: 12/14/23  3:33 PM  Result Value Ref Range   Troponin I (High Sensitivity) 24 (H) <18 ng/L    Comment: (NOTE) Elevated high sensitivity troponin I (hsTnI) values and significant  changes across serial measurements may suggest ACS but many other  chronic and acute conditions are known to elevate hsTnI results.  Refer to the "Links" section for chest pain algorithms and additional  guidance. Performed at [facility], [address] [place], [place] [address]    CT Angio Chest PE W/Cm &/Or Wo Cm Result Date: 12/14/2023 CLINICAL DATA:  Generalized chest pain worse with deep inspiration EXAM: CT ANGIOGRAPHY CHEST WITH CONTRAST TECHNIQUE: Multidetector CT imaging of the chest was performed using the standard protocol during bolus administration of intravenous contrast. Multiplanar CT image reconstructions and MIPs were obtained to evaluate the vascular anatomy. RADIATION DOSE REDUCTION: This exam was performed according to the departmental dose-optimization program which includes automated exposure control, adjustment of the mA and/or kV according to patient size and/or use of iterative reconstruction technique. CONTRAST:  75mL OMNIPAQUE IOHEXOL 350 MG/ML SOLN COMPARISON:  Chest x-ray 12/14/2023, cardiac CT 08/29/2022 FINDINGS: Cardiovascular: Satisfactory opacification of the pulmonary arteries to the segmental level. No evidence of pulmonary embolism. Mild to moderate aortic atherosclerosis. No aneurysm. Watch min device in the left atrial appendage. Coronary vascular calcification. Cardiomegaly. Trace pericardial effusion Mediastinum/Nodes: Patent trachea. No thyroid mass. Mildly prominent AP window node measuring 12 mm. Small hilar nodes, on the right measuring to 13 mm. Right paratracheal node measuring 13 mm. Subcarinal lymph node measures 25 mm on series 4, image 50. Esophagus within normal limits. Lungs/Pleura: Small pleural effusions. No focal  consolidation or pneumothorax. Mild mosaic density potentially due to small airways or small vessel disease. Scattered punctate pulmonary nodules all measuring less than 5 mm, for example 3 mm superior right lower lobe pulmonary nodule on series 6, image 55. Mild subpleural reticulation. Upper Abdomen: No acute finding Musculoskeletal: No acute osseous abnormality Review of the MIP images confirms the above findings. IMPRESSION: 1. Negative for acute pulmonary embolus 2. Cardiomegaly with small pleural effusions. 3. Nonspecific mild mediastinal and right hilar adenopathy 4. Scattered punctate pulmonary nodules measuring less than 5 mm. No follow-up needed if patient is low-risk (and has no known or suspected primary neoplasm). Non-contrast chest CT can be considered in 12 months if patient is high-risk. This recommendation follows the consensus statement: Guidelines for Management of Incidental Pulmonary Nodules Detected on CT Images: From the Fleischner Society 2017; Radiology 2017; 284:228-243. Aortic Atherosclerosis (ICD10-I70.0). Electronically Signed   By: Jasmine Pang M.D.   On: 12/14/2023 16:17   DG Chest Portable 1 View Result Date: 12/14/2023 CLINICAL DATA:  Chest pain. EXAM: PORTABLE CHEST 1 VIEW COMPARISON:  11/08/2023 FINDINGS: Technically limited due to habitus and AP technique. Chronic cardiomegaly, grossly unchanged allowing for differences in technique. Chronic elevation of right hemidiaphragm. Skin fold projects over the left hemithorax. Mild interstitial coarsening without focal airspace disease. No pneumothorax or large pleural effusion. Chronic right shoulder arthropathy. IMPRESSION: Stable cardiomegaly.  No acute radiographic findings. Electronically Signed   By: Narda Rutherford M.D.   On: 12/14/2023 13:59    Pending Labs Unresulted Labs (From admission, onward)    None       Vitals/Pain Today's Vitals   12/14/23 1237 12/14/23 1239 12/14/23 1252 12/14/23 1528  BP:  (!) 155/76  (!) 165/74 (!) 168/88  Pulse:  (!) 51 [name])  49 (!) 59  Resp:  18 20 16   Temp: 97.7 F (36.5 C)  97.7 F (36.5 C) 98 F (36.7 C)  TempSrc: Temporal  Oral Oral  SpO2:  90% (!) 86% 91%  Weight:      Height:      PainSc:    3     Isolation Precautions No active isolations  Medications Medications  furosemide (LASIX) injection 40 mg (40 mg Intravenous Given 12/14/23 1524)  nitroGLYCERIN (NITROGLYN) 2 % ointment 1 inch (1 inch Topical Given 12/14/23 1522)  iohexol (OMNIPAQUE) 350 MG/ML injection 75 mL (75 mLs Intravenous Contrast Given 12/14/23 1459)    Mobility walks with person assist       R Recommendations: See Admitting Provider Note  Report given to:

## 2023-12-14 NOTE — Assessment & Plan Note (Signed)
 Stable. -Hold lisinopril 40 mg daily with contrast exposure -Monitor with diuresis

## 2023-12-14 NOTE — Assessment & Plan Note (Signed)
 On admission. Hemoglobin stable at 11.3 g/dl. -Resume aspirin  12-15-2023 hold oral iron for now as not to confuse any black stools with GI bleeding.  12-16-2023 stable. Continue to hold po iron.

## 2023-12-14 NOTE — Assessment & Plan Note (Addendum)
 On admission. 11/11/23- A1c 8.3.. -Resume home Lantus at reduced dose 10 units daily, (home dose 38 units nightly). - SSI- M  12-15-2023 monitor CBGs.  12-16-2023 stable. CBG acceptable ranges.

## 2023-12-14 NOTE — Assessment & Plan Note (Signed)
 On admission. Incidental finding on CTA chest today 3/1- scattered punctate pulmonary nodules measuring less than 5 mm. No follow-up needed if patient is low-risk (and has no known or suspected primary neoplasm). Non-contrast chest CT can be considered in 12 months if patient is high-risk. -Reports his quit smoking cigarettes about 30 years ago. -Follow-up with outpatient provider.  12-15-2023 stable.  12-16-2023 stable.

## 2023-12-14 NOTE — H&P (Signed)
 History and Physical    Lucas Dudley ION:[id number] DOB: 05/23/1941 DOA: 12/14/2023  PCP: Anabel Halon, MD   Patient coming from: Home  I have personally briefly reviewed patient's old medical records in [facility]  Chief Complaint: Chest Pain  HPI: Lucas Dudley is a 83 y.o. male with medical history significant for systolic and diastolic CHF, hypertension, AVM, CAD, diabetes mellitus, OSA, paroxysmal atrial fibrillation. Patient presented to ED with complaints of chest pain, and bilateral lower extremity swelling with difficulty breathing.  Chest pain started this morning, constant, mid chest, nonradiating.  He reports onset of difficulty breathing mostly with exertion, and increasing bilateral lower extremity swelling , with intermittent abdominal bloating, that started several weeks ago.  Notes increase weight of at least 20 pounds from baseline of about 240s, to 260s now.  Saw his cardiologist 2/19, his dose of torsemide was increased from 40 to 60 mg, but for the past couple of days he has been taking up to 80 mg without improvement in swelling.  ED Course: O2 sats down to 86% on room air, on 3 L sats 91%.  Respiratory rate 16-20.  Heart rate 49- 59.  BNP 249.  Troponin 23 > 24. Chest X-ray without acute abnormality.  CTA chest negative for PE, small pleural effusions. Lasix 40 mg x 1 given.  Nitro paste applied. Hospitalist admit for CHF.  Review of Systems: As per HPI all other systems reviewed and negative.  Past Medical History:  Diagnosis Date   Arthritis    Atrial fibrillation (HCC)    CAD (coronary artery disease)    a. Cath 03/17/15 showing 100% ostial D1, 50% prox LAD to mid LAD, 40% RPDA stenosis. Med rx. // Myoview 01/2020: EF 31 diffuse perfusion defect without reversibility (suspect artifact); reviewed with Dr. Tama High study felt to be low risk   Cataract    Mixed form OD   Chronic diastolic CHF (congestive heart failure) (HCC)    Diabetic retinopathy  (HCC)    NPDR OU   Essential hypertension    Glaucoma    POAG OU   History of gout    Hyperlipidemia    Hypertensive retinopathy    OU   Kidney stones    Melanoma of neck (HCC)    NICM (nonischemic cardiomyopathy) (HCC)    OSA on CPAP 2012   Prostate cancer (HCC)    Type II diabetes mellitus (HCC)     Past Surgical History:  Procedure Laterality Date   ABDOMINAL HERNIA REPAIR     w/mesh   ATRIAL FIBRILLATION ABLATION N/A 06/06/2021   Procedure: ATRIAL FIBRILLATION ABLATION;  Surgeon: Hillis Range, MD;  Location: [facility] INVASIVE CV LAB;  Service: Cardiovascular;  Laterality: N/A;   BIOPSY  05/24/2022   Procedure: BIOPSY;  Surgeon: Corbin Ade, MD;  Location: [facility]O [facility];  Service: Endoscopy;;   BIOPSY  05/30/2022   Procedure: BIOPSY;  Surgeon: Corbin Ade, MD;  Location: [facility];  Service: Endoscopy;;   BIOPSY  01/10/2023   Procedure: BIOPSY;  Surgeon: Lemar Lofty., MD;  Location: [facility] ENDOSCOPY;  Service: Gastroenterology;;   CARDIAC CATHETERIZATION N/A 03/17/2015   Procedure: Left Heart Cath and Coronary Angiography;  Surgeon: Runell Gess, MD;  Location: [facility] INVASIVE CV LAB;  Service: Cardiovascular;  Laterality: N/A;   CARDIOVERSION N/A 08/09/2021   Procedure: CARDIOVERSION;  Surgeon: Chilton Si, MD;  Location: [facility] ENDOSCOPY;  Service: Cardiovascular;  Laterality: N/A;   CARDIOVERSION N/A 11/10/2021  Procedure: CARDIOVERSION;  Surgeon: Thomasene Ripple, DO;  Location: [facility] ENDOSCOPY;  Service: Cardiovascular;  Laterality: N/A;   carotid doppler  09/17/2008   rigt and left ICAs 0-49%;mildly  abnormal   CATARACT EXTRACTION Left 2020   Dr. Alben Spittle   COLONOSCOPY N/A 05/16/2017   Procedure: COLONOSCOPY;  Surgeon: Malissa Hippo, MD;  [facility]: [facility];  Service: Endoscopy;  Laterality: N/A;  930   COLONOSCOPY WITH PROPOFOL N/A 05/24/2022   Procedure: COLONOSCOPY WITH PROPOFOL;  Surgeon: Corbin Ade, MD;  [facility]: [facility];  Service: Endoscopy;   Laterality: N/A;   DOPPLER ECHOCARDIOGRAPHY  05/25/2009   EF 50-55%,LA mildly dilated, LV function normal   ELECTROPHYSIOLOGIC STUDY N/A 04/05/2015   Procedure: Cardioversion;  Surgeon: Thurmon Fair, MD;  Location: [facility] INVASIVE CV LAB;  Service: Cardiovascular;  Laterality: N/A;   ELECTROPHYSIOLOGIC STUDY N/A 09/06/2015   Procedure: Atrial Fibrillation Ablation;  Surgeon: Hillis Range, MD;  Location: [facility] INVASIVE CV LAB;  Service: Cardiovascular;  Laterality: N/A;   ELECTROPHYSIOLOGIC STUDY N/A 07/12/2016   redo afib ablation by Dr Johney Frame   ENTEROSCOPY N/A 05/30/2022   Procedure: ENTEROSCOPY;  Surgeon: Corbin Ade, MD;  [facility]: [facility];  Service: Endoscopy;  Laterality: N/A;   ESOPHAGOGASTRODUODENOSCOPY (EGD) WITH PROPOFOL N/A 05/24/2022   Procedure: ESOPHAGOGASTRODUODENOSCOPY (EGD) WITH PROPOFOL;  Surgeon: Corbin Ade, MD;  [facility];  Service: Endoscopy;  Laterality: N/A;   ESOPHAGOGASTRODUODENOSCOPY (EGD) WITH PROPOFOL N/A 01/10/2023   Procedure: ESOPHAGOGASTRODUODENOSCOPY (EGD) WITH PROPOFOL;  Surgeon: Meridee Score Netty Starring., MD;  Location: [facility] ENDOSCOPY;  Service: Gastroenterology;  Laterality: N/A;   EUS N/A 01/10/2023   Procedure: UPPER ENDOSCOPIC ULTRASOUND (EUS) RADIAL;  Surgeon: Lemar Lofty., MD;  Location: [facility] ENDOSCOPY;  Service: Gastroenterology;  Laterality: N/A;   EXCISIONAL HEMORRHOIDECTOMY     "inside and out"   EYE SURGERY Left 2020   Cat Sx - Dr. Alben Spittle   FINE NEEDLE ASPIRATION Right    knee; "drew ~ 1 quart off"   GIVENS CAPSULE STUDY N/A 05/25/2022   Procedure: GIVENS CAPSULE STUDY;  Surgeon: Lanelle Bal, DO;  Location: [facility];  Service: Endoscopy;  Laterality: N/A;   GIVENS CAPSULE STUDY N/A 01/23/2023   Procedure: GIVENS CAPSULE STUDY;  Surgeon: Dolores Frame, MD;  [facility]: [facility];  Service: Gastroenterology;  Laterality: N/A;  8:30am   HERNIA REPAIR     HOT HEMOSTASIS N/A 01/10/2023   Procedure: HOT  HEMOSTASIS (ARGON PLASMA COAGULATION/BICAP);  Surgeon: Lemar Lofty., MD;  Location: [facility] ENDOSCOPY;  Service: Gastroenterology;  Laterality: N/A;   LAPAROSCOPIC CHOLECYSTECTOMY     LEFT ATRIAL APPENDAGE OCCLUSION N/A 06/28/2022   Procedure: LEFT ATRIAL APPENDAGE OCCLUSION;  Surgeon: Tonny Bollman, MD;  Location: [facility] INVASIVE CV LAB;  Service: Cardiovascular;  Laterality: N/A;   MELANOMA EXCISION Right    "neck"   NM MYOCAR PERF WALL MOTION  02/21/2012   EF 61% ,EXERCISE 7 METS. exercise stopped due to wheezing and shortness of breathe   POLYPECTOMY  05/16/2017   Procedure: POLYPECTOMY;  Surgeon: Malissa Hippo, MD;  [facility];  Service: Endoscopy;;  colon   POLYPECTOMY  05/24/2022   Procedure: POLYPECTOMY;  Surgeon: Corbin Ade, MD;  [facility];  Service: Endoscopy;;   POLYPECTOMY  01/10/2023   Procedure: POLYPECTOMY;  Surgeon: Meridee Score Netty Starring., MD;  Location: [facility] ENDOSCOPY;  Service: Gastroenterology;;   PROSTATECTOMY     SHOULDER OPEN ROTATOR CUFF REPAIR Right X 2  SUBMUCOSAL TATTOO INJECTION  05/30/2022   Procedure: SUBMUCOSAL TATTOO INJECTION;  Surgeon: Corbin Ade, MD;  Location: [facility];  Service: Endoscopy;;   SUBMUCOSAL TATTOO INJECTION  01/10/2023   Procedure: SUBMUCOSAL TATTOO INJECTION;  Surgeon: Lemar Lofty., MD;  Location: [facility] ENDOSCOPY;  Service: Gastroenterology;;   TEE WITHOUT CARDIOVERSION N/A 09/05/2015   Procedure: TRANSESOPHAGEAL ECHOCARDIOGRAM (TEE);  Surgeon: Quintella Reichert, MD;  Location: [facility] ENDOSCOPY;  Service: Cardiovascular;  Laterality: N/A;   TEE WITHOUT CARDIOVERSION N/A 06/28/2022   Procedure: TRANSESOPHAGEAL ECHOCARDIOGRAM (TEE);  Surgeon: Tonny Bollman, MD;  Location: [facility] INVASIVE CV LAB;  Service: Cardiovascular;  Laterality: N/A;     reports that he quit smoking about 33 years ago. His smoking use included cigarettes. He started smoking about 60 years ago. He has a 54 pack-year smoking history.  He has been exposed to tobacco smoke. He has never used smokeless tobacco. He reports that he does not drink alcohol and does not use drugs.  Allergies  Allergen Reactions   Azithromycin Nausea Only   Tape Rash and Other (See Comments)    Causes skin redness, Use paper tape only.    Family History  Problem Relation Age of Onset   Heart disease Mother    Lung cancer Mother    Heart attack Mother 33   Diabetes Father    Heart disease Father    Stroke Brother    Healthy Daughter    Colon cancer Maternal Aunt        70s   Prior to Admission medications   Medication Sig Start Date End Date Taking? Authorizing Provider  acetaminophen (TYLENOL) 650 MG CR tablet Take 1,300 mg by mouth at bedtime.   Yes [provider]  amiodarone (PACERONE) 200 MG tablet TAKE 1 TABLET BY MOUTH EVERY DAY Patient taking differently: Take 200 mg by mouth at bedtime. 07/04/23  Yes Jonelle Sidle, MD  ammonium lactate (AMLACTIN DAILY) 12 % lotion Apply 1 Application topically as needed. 11/22/23  Yes Candelaria Stagers, DPM  Artificial Tear Ointment (DRY EYES OP) Place 1 drop into both eyes daily as needed (Dry eye).   Yes [provider]  aspirin 81 MG chewable tablet Chew 81 mg by mouth daily.   Yes [provider]  COMBIGAN 0.2-0.5 % ophthalmic solution Place 1 drop into both eyes 2 (two) times daily. 10/16/21  Yes [provider]  ELDERBERRY PO Take 2 tablets by mouth daily. Chewable   Yes [provider]  ferrous sulfate 325 (65 FE) MG tablet Take 1 tablet (325 mg total) by mouth daily with breakfast. 09/25/22 12/14/23 Yes Pennington, Rebekah M, PA-C  fluticasone (FLONASE) 50 MCG/ACT nasal spray SPRAY 2 SPRAYS INTO EACH NOSTRIL EVERY DAY 08/12/23  Yes Anabel Halon, MD  gemfibrozil (LOPID) 600 MG tablet Take 600 mg by mouth at bedtime.    Yes [provider]  glimepiride (AMARYL) 2 MG tablet Take 1 tablet (2 mg total) by mouth 2 (two) times daily with a  meal. Patient taking differently: Take 2 mg by mouth daily with breakfast. 08/08/23  Yes Anabel Halon, MD  Insulin Glargine (BASAGLAR KWIKPEN) 100 UNIT/ML Inject 45 Units into the skin at bedtime. Patient taking differently: Inject 38 Units into the skin at bedtime. 08/08/23  Yes Anabel Halon, MD  lisinopril (ZESTRIL) 40 MG tablet Take 1 tablet (40 mg total) by mouth daily. 08/08/23  Yes Anabel Halon, MD  magnesium gluconate (MAGONATE) 500 MG tablet Take  500 mg by mouth daily.   Yes [provider]  metFORMIN (GLUCOPHAGE-XR) 500 MG 24 hr tablet Take 1,000 mg by mouth in the morning and at bedtime. 11/11/19  Yes [provider]  OVER THE COUNTER MEDICATION Take 1 tablet by mouth daily. Beets Chew   Yes [provider]  pantoprazole (PROTONIX) 40 MG tablet TAKE 1 TABLET BY MOUTH EVERY DAY 07/08/23  Yes Carlan, Chelsea L, NP  potassium chloride (KLOR-CON) 10 MEQ tablet Take 1 tablet (10 mEq total) by mouth daily. 12/04/23 03/03/24 Yes Jonelle Sidle, MD  torsemide (DEMADEX) 20 MG tablet 40 Mg in the morning and 20 Mg in the evening Patient taking differently: Take 20-40 mg by mouth See admin instructions. 20mg  in the morning and 40mg  in the afternoon 12/04/23  Yes Jonelle Sidle, MD  zinc gluconate 50 MG tablet Take 50 mg by mouth daily.   Yes [provider]  camphor-menthol (ANTI-ITCH) lotion Apply 1 Application topically daily as needed for itching.    [provider]  mineral oil liquid Take 15 mLs by mouth daily as needed for moderate constipation.    [provider]    Physical Exam: Vitals:   12/14/23 1237 12/14/23 1239 12/14/23 1252 12/14/23 1528  BP:  (!) 155/76 (!) 165/74 (!) 168/88  Pulse:  (!) 51 (!) 49 (!) 59  Resp:  18 20 16   Temp: 97.7 F (36.5 C)  97.7 F (36.5 C) 98 F (36.7 C)  TempSrc: Temporal  Oral Oral  SpO2:  90% (!) 86% 91%  Weight:      Height:        Constitutional: NAD, calm,  comfortable Vitals:   12/14/23 1237 12/14/23 1239 12/14/23 1252 12/14/23 1528  BP:  (!) 155/76 (!) 165/74 (!) 168/88  Pulse:  (!) 51 (!) 49 (!) 59  Resp:  18 20 16   Temp: 97.7 F (36.5 C)  97.7 F (36.5 C) 98 F (36.7 C)  TempSrc: Temporal  Oral Oral  SpO2:  90% (!) 86% 91%  Weight:      Height:       Eyes: PERRL, lids and conjunctivae normal ENMT: Mucous membranes are moist.  Neck: normal, supple, no masses, no thyromegaly Respiratory: clear to auscultation bilaterally, no wheezing, no crackles. Normal respiratory effort. No accessory muscle use.  Cardiovascular: Regular rate and rhythm, no murmurs / rubs / gallops.  2+ to 3+ bilateral pitting lower extremity edema to knees at least, extremities warm. Abdomen: no tenderness, no masses palpated. No hepatosplenomegaly. Bowel sounds positive.  Musculoskeletal: no clubbing / cyanosis. No joint deformity upper and lower extremities.  Skin: no rashes, lesions, ulcers. No induration Neurologic: No facial asymmetry, moving extremities spontaneously, speech fluent.[name]  Psychiatric: Normal judgment and insight. Alert and oriented x 3. Normal mood.   Labs on Admission: I have personally reviewed following labs and imaging studies  CBC: Recent Labs  Lab 12/14/23 1356  WBC 11.5*  HGB 11.3*  HCT 37.6*  MCV 99.2  PLT 186   Basic Metabolic Panel: Recent Labs  Lab 12/14/23 1356  NA 140  K 3.4*  CL 95*  CO2 31  GLUCOSE 113*  BUN 21  CREATININE 1.40*  CALCIUM 9.1   GFR: Estimated Creatinine Clearance: 53.9 mL/min (A) (by C-G formula based on SCr of 1.4 mg/dL (H)). Liver Function Tests: Recent Labs  Lab 12/14/23 1356  AST 21  ALT 15  ALKPHOS 53  BILITOT 0.9  PROT 7.7  ALBUMIN 4.2  Urine analysis:    Component Value Date/Time   COLORURINE YELLOW 10/18/2018 1525   APPEARANCEUR CLEAR 10/18/2018 1525   LABSPEC 1.012 10/18/2018 1525   PHURINE 5.0 10/18/2018 1525   GLUCOSEU 50 (A) 10/18/2018 1525   HGBUR MODERATE (A)  10/18/2018 1525   BILIRUBINUR NEGATIVE 10/18/2018 1525   KETONESUR NEGATIVE 10/18/2018 1525   PROTEINUR 100 (A) 10/18/2018 1525   NITRITE NEGATIVE 10/18/2018 1525   LEUKOCYTESUR NEGATIVE 10/18/2018 1525    Radiological Exams on Admission: CT Angio Chest PE W/Cm &/Or Wo Cm Result Date: 12/14/2023 CLINICAL DATA:  Generalized chest pain worse with deep inspiration EXAM: CT ANGIOGRAPHY CHEST WITH CONTRAST TECHNIQUE: Multidetector CT imaging of the chest was performed using the standard protocol during bolus administration of intravenous contrast. Multiplanar CT image reconstructions and MIPs were obtained to evaluate the vascular anatomy. RADIATION DOSE REDUCTION: This exam was performed according to the departmental dose-optimization program which includes automated exposure control, adjustment of the mA and/or kV according to patient size and/or use of iterative reconstruction technique. CONTRAST:  75mL OMNIPAQUE IOHEXOL 350 MG/ML SOLN COMPARISON:  Chest x-ray 12/14/2023, cardiac CT 08/29/2022 FINDINGS: Cardiovascular: Satisfactory opacification of the pulmonary arteries to the segmental level. No evidence of pulmonary embolism. Mild to moderate aortic atherosclerosis. No aneurysm. Watch min device in the left atrial appendage. Coronary vascular calcification. Cardiomegaly. Trace pericardial effusion Mediastinum/Nodes: Patent trachea. No thyroid mass. Mildly prominent AP window node measuring 12 mm. Small hilar nodes, on the right measuring to 13 mm. Right paratracheal node measuring 13 mm. Subcarinal lymph node measures 25 mm on series 4, image 50. Esophagus within normal limits. Lungs/Pleura: Small pleural effusions. No focal consolidation or pneumothorax. Mild mosaic density potentially due to small airways or small vessel disease. Scattered punctate pulmonary nodules all measuring less than 5 mm, for example 3 mm superior right lower lobe pulmonary nodule on series 6, image 55. Mild subpleural  reticulation. Upper Abdomen: No acute finding Musculoskeletal: No acute osseous abnormality Review of the MIP images confirms the above findings. IMPRESSION: 1. Negative for acute pulmonary embolus 2. Cardiomegaly with small pleural effusions. 3. Nonspecific mild mediastinal and right hilar adenopathy 4. Scattered punctate pulmonary nodules measuring less than 5 mm. No follow-up needed if patient is low-risk (and has no known or suspected primary neoplasm). Non-contrast chest CT can be considered in 12 months if patient is high-risk. This recommendation follows the consensus statement: Guidelines for Management of Incidental Pulmonary Nodules Detected on CT Images: From the Fleischner Society 2017; Radiology 2017; 284:228-243. Aortic Atherosclerosis (ICD10-I70.0). Electronically Signed   By: Jasmine Pang M.D.   On: 12/14/2023 16:17   DG Chest Portable 1 View Result Date: 12/14/2023 CLINICAL DATA:  Chest pain. EXAM: PORTABLE CHEST 1 VIEW COMPARISON:  11/08/2023 FINDINGS: Technically limited due to habitus and AP technique. Chronic cardiomegaly, grossly unchanged allowing for differences in technique. Chronic elevation of right hemidiaphragm. Skin fold projects over the left hemithorax. Mild interstitial coarsening without focal airspace disease. No pneumothorax or large pleural effusion. Chronic right shoulder arthropathy. IMPRESSION: Stable cardiomegaly.  No acute radiographic findings. Electronically Signed   By: Narda Rutherford M.D.   On: 12/14/2023 13:59    EKG: Independently reviewed.  Sinus rhythm, rate 68, QTc 484.  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  Likely secondary to decompensated CHF, with CTA negative for PE, showing bilateral pleural effusions.  Clear lung exam.  COVID influenza RSV negative.  Quit smoking cigarettes almost 30 years ago.  Acute on chronic combined systolic and diastolic CHF (congestive heart failure) (HCC) Presenting with dyspnea on exertion, at least 2+ pitting bilateral lower extremity edema hypoxia.  BNP 249.  Per chart- weight gain over the past 6 weeks, per daughter up to 20 pound weight gain.  CT chest with cardiomegaly and small pleural effusions.  Has been compliant with torsemide 40 mg recently increased to 60 mg daily.  Last echo 05/2022 EF of 60 to 65%. -Good response in ED to 40 mg of Lasix, continue 40 twice daily -Obtain updated echocardiogram -Strict input output, daily weight, daily BMP -Reports midsternal chest pain, troponin 23 > 24, EKG unchanged.  Pulmonary nodules Incidental finding on CTA chest today 3/1- scattered punctate pulmonary nodules measuring less than 5 mm. No follow-up needed if patient is low-risk (and has no known or suspected primary neoplasm). Non-contrast chest CT can be considered in 12 months if patient is high-risk. -Reports his quit smoking cigarettes about 30 years ago. -Follow-up with outpatient provider  Hypokalemia Potassium 3.4.   - Replete  -Check magnesium  AVM (arteriovenous malformation) of small bowel, acquired Hemoglobin stable at 11.3. -Resume aspirin  Type 2 diabetes mellitus with other specified complication (HCC) 11/11/23- A1c 8.3. -Resume home Lantus at reduced dose 10 units daily, (home dose 38 units nightly) - SSI- M  Paroxysmal atrial fibrillation (HCC) Currently in sinus rhythm.  On  aspirin.Watchman device.  Follows with Dr. Diona Browner.  Not on anticoagulation. Hx of multiple AVMs. -Resume amiodarone -Resume aspirin.  Essential hypertension Stable. -Hold lisinopril 40 mg daily with contrast exposure -Monitor with diuresis   DVT prophylaxis: Lovenox Code Status: FULL Code- confirmed with patient and daughter at bedside. Family Communication:  Daughter Marylene Land at bedside Disposition Plan: > 2 days Consults called: None Admission status: Inpt Tele  I certify that at the point of admission it is my clinical judgment that the patient will require inpatient hospital care spanning beyond 2 midnights from the point of admission due to high intensity of service, high risk for further deterioration and high frequency of surveillance required.    Author: Onnie Boer, MD 12/14/2023 5:44 PM  For on call review [email or web address].

## 2023-12-14 NOTE — Assessment & Plan Note (Addendum)
 On admission. Currently in sinus rhythm.  On aspirin.Watchman device.  Follows with Dr. Diona Browner.  Not on anticoagulation. Hx of multiple AVMs. -Resume amiodarone. -Resume aspirin.  12-15-2023 stable.  12-16-2023 stable.

## 2023-12-14 NOTE — ED Provider Notes (Signed)
 [place] EMERGENCY DEPARTMENT AT [facility] Provider Note   CSN: [id number] Arrival date & time: 12/14/23  1230     History  Chief Complaint  Patient presents with   Chest Pain    Lucas Dudley is a 83 y.o. male.  Patient is an 83 year old male with a past medical history of CAD, hypertension, hyperlipidemia, CHF who presents to the emergency department the chief complaint of chest pain this morning.  He notes that he has been experiencing ongoing shortness of breath for the past few weeks that has progressively gotten worse.  He notes that the pain is worse with deep inspiration.  He denies any associated dizziness, lightheadedness or syncope.  He denies any abdominal pain, nausea, vomiting, diarrhea.  Daughter does note that he has been experiencing ongoing complications with anemia.  He does admit to increased edema in his lower extremities.  He does note that his cardiologist Dr. Diona Browner recently increased his Lasix to 60 mg daily last week.   Chest Pain      Home Medications Prior to Admission medications   Medication Sig Start Date End Date Taking? Authorizing Provider  acetaminophen (TYLENOL) 650 MG CR tablet Take 1,300 mg by mouth every 8 (eight) hours as needed for pain.    [provider]  amiodarone (PACERONE) 200 MG tablet TAKE 1 TABLET BY MOUTH EVERY DAY 07/04/23   Jonelle Sidle, MD  ammonium lactate (AMLACTIN DAILY) 12 % lotion Apply 1 Application topically as needed. 11/22/23   Candelaria Stagers, DPM  Artificial Tear Ointment (DRY EYES OP) Place 1 drop into both eyes daily as needed (Dry eye).    [provider]  aspirin 81 MG chewable tablet Chew 81 mg by mouth daily.    [provider]  camphor-menthol (ANTI-ITCH) lotion Apply 1 Application topically daily as needed for itching.    [provider]  COMBIGAN 0.2-0.5 % ophthalmic solution Place 1 drop into both eyes 2 (two) times daily. 10/16/21   [provider]   ELDERBERRY PO Take 2 tablets by mouth daily. Chewable    [provider]  ferrous sulfate 325 (65 FE) MG tablet Take 1 tablet (325 mg total) by mouth daily with breakfast. 09/25/22 12/04/23  Carnella Gu[place], PA-C  fluticasone (FLONASE) 50 MCG/ACT nasal spray SPRAY 2 SPRAYS INTO EACH NOSTRIL EVERY DAY 08/12/23   Anabel Halon, MD  gemfibrozil (LOPID) 600 MG tablet Take 600 mg by mouth at bedtime.     [provider]  glimepiride (AMARYL) 2 MG tablet Take 1 tablet (2 mg total) by mouth 2 (two) times daily with a meal. Patient taking differently: Take 2 mg by mouth daily with breakfast. 08/08/23   Anabel Halon, MD  Insulin Glargine (BASAGLAR KWIKPEN) 100 UNIT/ML Inject 45 Units into the skin at bedtime. 08/08/23   Anabel Halon, MD  lisinopril (ZESTRIL) 40 MG tablet Take 1 tablet (40 mg total) by mouth daily. 08/08/23   Anabel Halon, MD  magnesium gluconate (MAGONATE) 500 MG tablet Take 500 mg by mouth daily.    [provider]  metFORMIN (GLUCOPHAGE-XR) 500 MG 24 hr tablet Take 1,000 mg by mouth in the morning and at bedtime. 11/11/19   [provider]  mineral oil liquid Take 15 mLs by mouth daily as needed for moderate constipation.    [provider]  OVER THE COUNTER MEDICATION Take 1 tablet by mouth daily. Beets Chew    [provider]  pantoprazole (PROTONIX) 40 MG tablet TAKE 1 TABLET BY MOUTH EVERY DAY 07/08/23   Carlan, Chelsea L, NP  potassium chloride (KLOR-CON) 10 MEQ tablet Take 1 tablet (10 mEq total) by mouth daily. 12/04/23 03/03/24  Jonelle Sidle, MD  torsemide (DEMADEX) 20 MG tablet 40 Mg in the morning and 20 Mg in the evening 12/04/23   Jonelle Sidle, MD  zinc gluconate 50 MG tablet Take 50 mg by mouth daily.    [provider]      Allergies    Azithromycin and Tape    Review of Systems   Review of Systems  Cardiovascular:  Positive for chest pain.  All other systems reviewed and are  negative.   Physical Exam Updated Vital Signs BP (!) 165/74 (BP Location: Left Arm)   Pulse (!) 49   Temp 97.7 F (36.5 C) (Oral)   Resp 20   Ht 5\' 10"  (1.778 m)   Wt 117.1 kg   SpO2 (!) 86%   BMI 37.04 kg/m  Physical Exam Vitals and nursing note reviewed.  Constitutional:      Appearance: Normal appearance.  HENT:     Head: Normocephalic and atraumatic.     Nose: Nose normal.     Mouth/Throat:     Mouth: Mucous membranes are moist.  Eyes:     Extraocular Movements: Extraocular movements intact.     Conjunctiva/sclera: Conjunctivae normal.     Pupils: Pupils are equal, round, and reactive to light.  Cardiovascular:     Rate and Rhythm: Normal rate and regular rhythm.     Pulses: Normal pulses.     Heart sounds: Normal heart sounds. Heart sounds not distant. No murmur heard. Pulmonary:     Effort: Pulmonary effort is normal. No tachypnea or respiratory distress.     Breath sounds: Normal breath sounds. No decreased breath sounds, wheezing or rhonchi.     Comments: Rales noted to bilateral lung bases Abdominal:     General: Abdomen is flat. Bowel sounds are normal.     Palpations: Abdomen is soft.  Musculoskeletal:        General: Normal range of motion.     Cervical back: Normal range of motion and neck supple.     Right lower leg: Edema present.     Left lower leg: Edema present.  Skin:    General: Skin is warm and dry.     Findings: No rash.  Neurological:     General: No focal deficit present.     Mental Status: He is alert and oriented to person, place, and time. Mental status is at baseline.  Psychiatric:        Mood and Affect: Mood normal.        Behavior: Behavior normal.        Thought Content: Thought content normal.        Judgment: Judgment normal.     ED Results / Procedures / Treatments   Labs (all labs ordered are listed, but only abnormal results are displayed) Labs Reviewed  RESP PANEL BY RT-PCR (RSV, FLU A&B, COVID)  [id number]  BASIC  METABOLIC PANEL  CBC  HEPATIC FUNCTION PANEL  BRAIN NATRIURETIC PEPTIDE  TROPONIN I (HIGH SENSITIVITY)    EKG None  Radiology No results found.  Procedures .Critical Care  Performed by: Lelon Perla, PA-C Authorized by: Lelon Perla, PA-C   Critical care provider statement:    Critical care time (minutes):  35   Critical care  was necessary to treat or prevent imminent or life-threatening deterioration of the following conditions:  Respiratory failure   Critical care was time spent personally by me on the following activities:  Development of treatment plan with patient or surrogate, discussions with consultants, evaluation of patient's response to treatment, examination of patient, ordering and review of laboratory studies, ordering and review of radiographic studies, ordering and performing treatments and interventions, pulse oximetry, re-evaluation of patient's condition and review of old charts   I assumed direction of critical care for this patient from another provider in my specialty: no     Care discussed with: admitting provider       Medications Ordered in ED Medications  furosemide (LASIX) injection 40 mg (has no administration in time range)    ED Course/ Medical Decision Making/ A&P                                 Medical Decision Making Amount and/or Complexity of Data Reviewed Labs: ordered. Radiology: ordered.  Risk Prescription drug management. Decision regarding hospitalization.   This patient presents to the ED for concern of shortness of breath, chest pain, this involves an extensive number of treatment options, and is a complaint that carries with it a high risk of complications and morbidity.  The differential diagnosis includes ACS, pulmonary embolus, acute CHF, pericarditis, myocarditis, endocarditis, aortic aneurysm or dissection, pneumonia, acute viral syndrome, sepsis   Co morbidities that complicate the patient  evaluation  CHF   Additional history obtained:  Additional history obtained from daughter External records from outside source obtained and reviewed including medical records   Lab Tests:  I Ordered, and personally interpreted labs.  The pertinent results include: Evaded BNP, elevated troponin, creatinine at baseline   Imaging Studies ordered:  I ordered imaging studies including chest x-ray and CT of the chest I independently visualized and interpreted imaging which showed pulmonary edema and pleural effusions I agree with the radiologist interpretation   Cardiac Monitoring: / EKG:  The patient was maintained on a cardiac monitor.  I personally viewed and interpreted the cardiac monitored which showed an underlying rhythm of: Normal sinus rhythm, rate of 54, normal PR/QRS interval, mildly prolonged QTc, nonspecific T wave change, no ST changes, no STEMI, EKG consistent with previous   Consultations Obtained:  I requested consultation with the hospitalist,  and discussed lab and imaging findings as well as pertinent plan - they recommend: Admission   Problem List / ED Course / Critical interventions / Medication management  Patient does remain stable at this time.  He is currently requiring 2 L nasal cannula to keep his oxygen above 92%.  He was at the mid 80s on initial presentation.  Symptoms are most consistent with acute CHF at this point given his increased edema.  He has been given Lasix as well as Nitropaste.  Troponin is chronically elevated and his creatinine is at his baseline.  His BNP is elevated but better than previous.  He has no signs of acute respiratory distress at this point do not suspect the BiPAP is warranted.  Do not suspect underlying infectious etiology at this time.  Viral swabs were negative.  Will discuss patient case with hospitalist for admission at this time.  As patient case with Dr. Wendall Stade who has accepted admission at this time.  I ordered  medication including Nitropaste, Lasix for CHF Reevaluation of the patient after these medicines  showed that the patient improved I have reviewed the patients home medicines and have made adjustments as needed   Social Determinants of Health:  None   Test / Admission - Considered:  Admission        Final Clinical Impression(s) / ED Diagnoses Final diagnoses:  None    Rx / DC Orders ED Discharge Orders     None         Lelon Perla, PA-C 12/14/23 1639    Bethann Berkshire, MD 12/17/23 1020

## 2023-12-14 NOTE — Assessment & Plan Note (Addendum)
 Presenting with dyspnea on exertion, at least 2+ pitting bilateral lower extremity edema hypoxia.  BNP 249.  Per chart- weight gain over the past 6 weeks, per daughter up to 20 pound weight gain.  CT chest with cardiomegaly and small pleural effusions.  Has been compliant with torsemide 40 mg recently increased to 60 mg daily.  Last echo 05/2022 EF of 60 to 65%. -Good response in ED to 40 mg of Lasix, continue 40 twice daily -Obtain updated echocardiogram -Strict input output, daily weight, daily BMP -Reports midsternal chest pain, troponin 23 > 24, EKG unchanged.

## 2023-12-14 NOTE — Plan of Care (Signed)

## 2023-12-14 NOTE — ED Triage Notes (Signed)
 Pt with generalized chest pain, worse with deep breath.  Pain started this morning per pt. Denies any SOB or N/V

## 2023-12-14 NOTE — Progress Notes (Signed)
 PHARMACIST - PHYSICIAN COMMUNICATION  CONCERNING:  Enoxaparin (Lovenox) for DVT Prophylaxis    RECOMMENDATION: Patient was prescribed enoxaprin 40mg  q24 hours for VTE prophylaxis.   Filed Weights   12/14/23 1235  Weight: 117.1 kg (258 lb 2.5 oz)    Body mass index is 37.04 kg/m.  Estimated Creatinine Clearance: 53.9 mL/min (A) (by C-G formula based on SCr of 1.4 mg/dL (H)).   Based on [facility] policy patient is candidate for enoxaparin 0.5mg /kg TBW SQ every 24 hours based on BMI being >30.   DESCRIPTION: Pharmacy has adjusted enoxaparin dose per [facility] policy.  Patient is now receiving enoxaparin 57.5 mg every 24 hours    Kinzy Weyers Rodriguez-Guzman PharmD, BCPS 12/14/2023 7:04 PM

## 2023-12-14 NOTE — Progress Notes (Signed)
 Refused CPAP for tonight

## 2023-12-14 NOTE — Assessment & Plan Note (Signed)
 Potassium 3.4.   - Replete  -Check magnesium

## 2023-12-15 ENCOUNTER — Inpatient Hospital Stay (HOSPITAL_COMMUNITY)

## 2023-12-15 ENCOUNTER — Encounter (HOSPITAL_COMMUNITY): Payer: Self-pay | Admitting: Internal Medicine

## 2023-12-15 DIAGNOSIS — E876 Hypokalemia: Secondary | ICD-10-CM | POA: Diagnosis not present

## 2023-12-15 DIAGNOSIS — I5043 Acute on chronic combined systolic (congestive) and diastolic (congestive) heart failure: Secondary | ICD-10-CM

## 2023-12-15 DIAGNOSIS — G4733 Obstructive sleep apnea (adult) (pediatric): Secondary | ICD-10-CM | POA: Diagnosis not present

## 2023-12-15 DIAGNOSIS — J9601 Acute respiratory failure with hypoxia: Secondary | ICD-10-CM | POA: Diagnosis not present

## 2023-12-15 DIAGNOSIS — I10 Essential (primary) hypertension: Secondary | ICD-10-CM | POA: Diagnosis not present

## 2023-12-15 LAB — CBC
HCT: 34.1 % — ABNORMAL LOW (ref 39.0–52.0)
Hemoglobin: 10.2 g/dL — ABNORMAL LOW (ref 13.0–17.0)
MCH: 29.5 pg (ref 26.0–34.0)
MCHC: 29.9 g/dL — ABNORMAL LOW (ref 30.0–36.0)
MCV: 98.6 fL (ref 80.0–100.0)
Platelets: 214 10*3/uL (ref 150–400)
RBC: 3.46 MIL/uL — ABNORMAL LOW (ref 4.22–5.81)
RDW: 23.1 % — ABNORMAL HIGH (ref 11.5–15.5)
WBC: 8.8 10*3/uL (ref 4.0–10.5)
nRBC: 0 % (ref 0.0–0.2)

## 2023-12-15 LAB — ECHOCARDIOGRAM COMPLETE
AR max vel: 1.83 cm2
AV Area VTI: 1.59 cm2
AV Area mean vel: 1.71 cm2
AV Mean grad: 4 mmHg
AV Peak grad: 6.8 mmHg
Ao pk vel: 1.3 m/s
Area-P 1/2: 4.41 cm2
Height: 70 in
S' Lateral: 3.8 cm
Weight: 4017.66 [oz_av]

## 2023-12-15 LAB — GLUCOSE, CAPILLARY
Glucose-Capillary: 119 mg/dL — ABNORMAL HIGH (ref 70–99)
Glucose-Capillary: 203 mg/dL — ABNORMAL HIGH (ref 70–99)
Glucose-Capillary: 202 mg/dL — ABNORMAL HIGH (ref 70–99)
Glucose-Capillary: 277 mg/dL — ABNORMAL HIGH (ref 70–99)

## 2023-12-15 LAB — BASIC METABOLIC PANEL
Anion gap: 7 (ref 5–15)
BUN: 17 mg/dL (ref 8–23)
CO2: 31 mmol/L (ref 22–32)
Calcium: 8.4 mg/dL — ABNORMAL LOW (ref 8.9–10.3)
Chloride: 98 mmol/L (ref 98–111)
Creatinine, Ser: 1.36 mg/dL — ABNORMAL HIGH (ref 0.61–1.24)
GFR, Estimated: 53 mL/min — ABNORMAL LOW (ref >60)
Glucose, Bld: 134 mg/dL — ABNORMAL HIGH (ref 70–99)
Potassium: 3.7 mmol/L (ref 3.5–5.1)
Sodium: 136 mmol/L (ref 135–145)

## 2023-12-15 MED ORDER — FUROSEMIDE 10 MG/ML IJ SOLN: 80.0000 mg | Freq: Two times a day (BID) | INTRAMUSCULAR | Status: DC

## 2023-12-15 MED ORDER — FUROSEMIDE 10 MG/ML IJ SOLN
80.0000 mg | Freq: Two times a day (BID) | INTRAMUSCULAR | Status: DC
Start: 2023-12-15 — End: 2023-12-16
  Administered 2023-12-15 – 2023-12-16 (×3): 80 mg via INTRAVENOUS
  Filled 2023-12-15 (×3): qty 8

## 2023-12-15 MED ORDER — ACETAZOLAMIDE 250 MG PO TABS
250.0000 mg | ORAL_TABLET | Freq: Two times a day (BID) | ORAL | Status: DC
  Administered 2023-12-15 (×2): 250 mg via ORAL
  Filled 2023-12-15 (×7): qty 1

## 2023-12-15 MED ORDER — PERFLUTREN LIPID MICROSPHERE
1.0000 mL | INTRAVENOUS | Status: AC | PRN
  Administered 2023-12-15: 4 mL via INTRAVENOUS

## 2023-12-15 MED ORDER — POTASSIUM CHLORIDE CRYS ER 20 MEQ PO TBCR
20.0000 meq | EXTENDED_RELEASE_TABLET | Freq: Two times a day (BID) | ORAL | Status: DC
  Administered 2023-12-15 – 2023-12-17 (×5): 20 meq via ORAL
  Filled 2023-12-15 (×5): qty 1

## 2023-12-15 MED ORDER — BUMETANIDE 0.25 MG/ML IJ SOLN
2.0000 mg | Freq: Two times a day (BID) | INTRAMUSCULAR | Status: DC
  Filled 2023-12-15 (×3): qty 8

## 2023-12-15 MED ORDER — HYDRALAZINE HCL 10 MG PO TABS
10.0000 mg | ORAL_TABLET | Freq: Three times a day (TID) | ORAL | Status: DC
  Administered 2023-12-15 – 2023-12-17 (×6): 10 mg via ORAL
  Filled 2023-12-15 (×6): qty 1

## 2023-12-15 MED ORDER — METOLAZONE 5 MG PO TABS
2.5000 mg | ORAL_TABLET | Freq: Every day | ORAL | Status: DC
  Administered 2023-12-15: 2.5 mg via ORAL
  Filled 2023-12-15: qty 1

## 2023-12-15 NOTE — Subjective & Objective (Addendum)
 Pt seen and examined. Pt states he weighs about 20 lbs more than baseline.  Not sure if that is true. PCP office note from 11-11-2023 documents his weight at 116.6 kg. Cardiology office note from 12-04-2023 showed weight at 119.2 kg  Admission weight was 113.9 kg.  Pt does not use home O2. Currently on 2 L/min with sats 95%  Pt states he cannot sleep in hospital bed due to chronic back pain. Slept in recliner. Not much urine output with 40 mg IV lasix yesterday. On 20 mg demadex bid at home.

## 2023-12-15 NOTE — Progress Notes (Signed)
*  PRELIMINARY RESULTS* Echocardiogram 2D Echocardiogram has been performed.  Lucas Dudley Lucas Dudley 12/15/2023, 10:30 AM

## 2023-12-15 NOTE — Hospital Course (Signed)
 HPI: Lucas Dudley is a 83 y.o. male with medical history significant for systolic and diastolic CHF, hypertension, AVM, CAD, diabetes mellitus, OSA, paroxysmal atrial fibrillation. Patient presented to ED with complaints of chest pain, and bilateral lower extremity swelling with difficulty breathing.  Chest pain started this morning, constant, mid chest, nonradiating.  He reports onset of difficulty breathing mostly with exertion, and increasing bilateral lower extremity swelling , with intermittent abdominal bloating, that started several weeks ago.  Notes increase weight of at least 20 pounds from baseline of about 240s, to 260s now.  Saw his cardiologist 2/19, his dose of torsemide was increased from 40 to 60 mg, but for the past couple of days he has been taking up to 80 mg without improvement in swelling.   ED Course: O2 sats down to 86% on room air, on 3 L sats 91%.  Respiratory rate 16-20.  Heart rate 49- 59.  BNP 249.  Troponin 23 > 24. Chest X-ray without acute abnormality.  CTA chest negative for PE, small pleural effusions. Lasix 40 mg x 1 given.  Nitro paste applied. Hospitalist admit for CHF.  Significant Events: Admitted 12/14/2023 for acute respiratory failure with hypoxia, acute on chronic combined CHF   Significant Labs: WBC 11.5, HgB 11.3, Plt 186 Na 140, K 3.3, CO2 of 31, BUN 21, Scr 1.4 BNP 249  Significant Imaging Studies: CXR Stable cardiomegaly. No acute radiographic findings.  CTPA Negative for acute pulmonary embolus 2. Cardiomegaly with small pleural effusions. 3. Nonspecific mild mediastinal and right hilar adenopathy 4. Scattered punctate pulmonary nodules measuring less than 5 mm.   Antibiotic Therapy: Anti-infectives (From admission, onward)    None       Procedures:   Consultants:

## 2023-12-15 NOTE — Assessment & Plan Note (Addendum)
 12-15-2023 chronic. Had Watchman device placed 06-28-2022   12-16-2023 stable.

## 2023-12-15 NOTE — Plan of Care (Signed)
  Problem: Education: Goal: Knowledge of General Education information will improve Description: Including pain rating scale, medication(s)/side effects and non-pharmacologic comfort measures Outcome: Progressing   Problem: Clinical Measurements: Goal: Respiratory complications will improve Outcome: Progressing   Problem: Clinical Measurements: Goal: Cardiovascular complication will be avoided Outcome: Progressing   Problem: Activity: Goal: Risk for activity intolerance will decrease Outcome: Progressing   Problem: Nutrition: Goal: Adequate nutrition will be maintained Outcome: Progressing

## 2023-12-15 NOTE — Progress Notes (Signed)
   12/15/23 1750  TOC Brief Assessment  Insurance and Status Reviewed  Patient has primary care physician Yes  Home environment has been reviewed From home  Prior level of function: Independent  Prior/Current Home Services No current home services  Social Drivers of Health Review SDOH reviewed no interventions necessary  Readmission risk has been reviewed Yes ([place])  Transition of care needs transition of care needs identified, TOC will continue to follow    Pt High Risk Readmission- need to assess. Diuresing, DC 1-2 days.

## 2023-12-15 NOTE — Progress Notes (Addendum)
 PROGRESS NOTE    Lucas Dudley  ZOX:[id number] DOB: 1940/11/19 DOA: 12/14/2023 PCP: Anabel Halon, MD  Subjective: Pt seen and examined. Pt states he weighs about 20 lbs more than baseline.  Not sure if that is true. PCP office note from 11-11-2023 documents his weight at 116.6 kg. Cardiology office note from 12-04-2023 showed weight at 119.2 kg  Admission weight was 113.9 kg.  Pt does not use home O2. Currently on 2 L/min with sats 95%  Pt states he cannot sleep in hospital bed due to chronic back pain. Slept in recliner. Not much urine output with 40 mg IV lasix yesterday. On 20 mg demadex bid at home.   Hospital Course: HPI: Lucas Dudley is a 83 y.o. male with medical history significant for systolic and diastolic CHF, hypertension, AVM, CAD, diabetes mellitus, OSA, paroxysmal atrial fibrillation. Patient presented to ED with complaints of chest pain, and bilateral lower extremity swelling with difficulty breathing.  Chest pain started this morning, constant, mid chest, nonradiating.  He reports onset of difficulty breathing mostly with exertion, and increasing bilateral lower extremity swelling , with intermittent abdominal bloating, that started several weeks ago.  Notes increase weight of at least 20 pounds from baseline of about 240s, to 260s now.  Saw his cardiologist 2/19, his dose of torsemide was increased from 40 to 60 mg, but for the past couple of days he has been taking up to 80 mg without improvement in swelling.   ED Course: O2 sats down to 86% on room air, on 3 L sats 91%.  Respiratory rate 16-20.  Heart rate 49- 59.  BNP 249.  Troponin 23 > 24. Chest X-ray without acute abnormality.  CTA chest negative for PE, small pleural effusions. Lasix 40 mg x 1 given.  Nitro paste applied. Hospitalist admit for CHF.  Significant Events: Admitted 12/14/2023 for acute respiratory failure with hypoxia, acute on chronic combined CHF   Significant Labs: WBC 11.5, HgB 11.3, Plt 186 Na  140, K 3.3, CO2 of 31, BUN 21, Scr 1.4 BNP 249  Significant Imaging Studies: CXR Stable cardiomegaly. No acute radiographic findings.  CTPA Negative for acute pulmonary embolus 2. Cardiomegaly with small pleural effusions. 3. Nonspecific mild mediastinal and right hilar adenopathy 4. Scattered punctate pulmonary nodules measuring less than 5 mm.   Antibiotic Therapy: Anti-infectives (From admission, onward)    None       Procedures:   Consultants:     Assessment and Plan: * Acute hypoxic respiratory failure (HCC) On admission. O2 sats down to 85% on room air, currently on 3 L sats 91%.  Likely secondary to decompensated CHF, with CTA negative for PE, showing bilateral pleural effusions.  Clear lung exam.  COVID influenza RSV negative.  Quit smoking cigarettes almost 30 years ago.  12-15-2023 on my review of CTPA. Pt with minimal pleural effusions. Effusions not the cause of his hypoxia. 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  Acute on chronic combined systolic and diastolic CHF (congestive heart failure) (HCC) On admission. Presenting with dyspnea on exertion, at least 2+ pitting bilateral lower extremity edema hypoxia.  BNP 249.  Per chart- weight gain over the past 6 weeks, per daughter up to 20 pound weight gain.  CT chest with cardiomegaly and small pleural effusions.  Has been compliant with torsemide 40 mg recently increased to 60 mg daily.  Last echo 05/2022 EF of 60 to 65%. -Good response in ED to 40 mg of Lasix, continue 40  twice daily -Obtain updated echocardiogram -Strict input output, daily weight, daily BMP -Reports midsternal chest pain, troponin 23 > 24, EKG unchanged.  12-15-2023 Pt states he weighs about 20 lbs more than baseline.  Not sure if that is true. PCP office note from 11-11-2023 documents his weight at 116.6 kg. Cardiology office note from 12-04-2023 showed weight at 119.2 kg. Admission weight was 113.9 kg. Pt's documented weights are not correlating with  his statements. Will change diuretics to Bumex 2 mg IV 12h. Add small dose of metolazone and diamox. He only has +1 pitting edema. R LE > Left LE. Repeat BMP and BNP in AM.  Pulmonary nodules On admission. Incidental finding on CTA chest today 3/1- scattered punctate pulmonary nodules measuring less than 5 mm. No follow-up needed if patient is low-risk (and has no known or suspected primary neoplasm). Non-contrast chest CT can be considered in 12 months if patient is high-risk. -Reports his quit smoking cigarettes about 30 years ago. -Follow-up with outpatient provider.  12-15-2023 stable.  Hypokalemia On admission. Potassium 3.4.  Replete with oral KCL. -Check magnesium  12-15-2023 K 3.7 today. Add kcl replacement during aggressive diuresis.  Essential hypertension On admission. Stable. -Hold lisinopril 40 mg daily with contrast exposure -Monitor with diuresis  12-15-2023 monitor BP during diuresis. Coreg was stopped by cardiology during 12-04-2023 office visit due concern for possible symptomatic bradycardia. Add hydralazine for elevated BP.  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 Had Watchman device placed 06-28-2022   AVM (arteriovenous malformation) of small bowel, acquired On admission. Hemoglobin stable at 11.3 g/dl. -Resume aspirin  12-15-2023 hold oral iron for now as not to confuse any black stools with GI bleeding.  OSA on CPAP 12-15-2023 pt may use hospital CPAP machine.  Type 2 diabetes mellitus with other specified complication (HCC) On admission. 11/11/23- A1c 8.3.. -Resume home Lantus at reduced dose 10 units daily, (home dose 38 units nightly). - SSI- M  12-15-2023 monitor CBGs.  Paroxysmal atrial fibrillation (HCC) On admission. Currently in sinus rhythm.  On aspirin.Watchman device.  Follows with Dr. Diona Browner.  Not on anticoagulation. Hx of multiple AVMs. -Resume amiodarone. -Resume aspirin.  12-15-2023  stable.  DVT prophylaxis:   Lovenox   Code Status: Full Code Family Communication: no family at bedside Disposition Plan: return home Reason for continuing need for hospitalization: remains on IV bumex, supplemental O2  Objective: Vitals:   12/14/23 2100 12/14/23 2142 12/15/23 0325 12/15/23 0500  BP:  (!) 141/65 (!) 155/75   Pulse:  66 (!) 59   Resp:  18 16   Temp:  98.5 F (36.9 C) 98.9 F (37.2 C)   TempSrc:  Oral Oral   SpO2: 98% 99% 95%   Weight:    113.9 kg  Height:        Intake/Output Summary (Last 24 hours) at 12/15/2023 0953 Last data filed at 12/15/2023 0908 Gross per 24 hour  Intake 360 ml  Output 350 ml  Net 10 ml   Filed Weights   12/14/23 1235 12/15/23 0500  Weight: 117.1 kg 113.9 kg    Examination:  Physical Exam Vitals and nursing note reviewed.  Constitutional:      General: He is not in acute distress.    Appearance: He is obese. He is not toxic-appearing or diaphoretic.  HENT:     Head: Normocephalic and atraumatic.     Nose: Nose normal.  Eyes:     General: No scleral icterus. Cardiovascular:  Rate and Rhythm: Normal rate and regular rhythm.  Pulmonary:     Effort: Pulmonary effort is normal. No respiratory distress.     Breath sounds: Rales present.  Abdominal:     General: Abdomen is protuberant. Bowel sounds are normal. There is no distension.     Palpations: Abdomen is soft.     Tenderness: There is no abdominal tenderness.  Musculoskeletal:     Right lower leg: Edema present.     Left lower leg: Edema present.     Comments: +1-2 pitting Right LE edema +1 pitting Left LE edema Trace bilateral pedal pitting edema  Skin:    General: Skin is warm and dry.     Capillary Refill: Capillary refill takes less than 2 seconds.  Neurological:     General: No focal deficit present.     Mental Status: He is alert and oriented to person, place, and time.   Data Reviewed: I have personally reviewed following labs and imaging  studies  CBC: Recent Labs  Lab 12/14/23 1356 12/15/23 0439  WBC 11.5* 8.8  HGB 11.3* 10.2*  HCT 37.6* 34.1*  MCV 99.2 98.6  PLT 186 214   Basic Metabolic Panel: Recent Labs  Lab 12/14/23 1356 12/14/23 1533 12/15/23 0439  NA 140  --  136  K 3.4*  --  3.7  CL 95*  --  98  CO2 31  --  31  GLUCOSE 113*  --  134*  BUN 21  --  17  CREATININE 1.40*  --  1.36*  CALCIUM 9.1  --  8.4*  MG  --  1.9  --    GFR: Estimated Creatinine Clearance: 54.8 mL/min (A) (by C-G formula based on SCr of 1.36 mg/dL (H)). Liver Function Tests: Recent Labs  Lab 12/14/23 1356  AST 21  ALT 15  ALKPHOS 53  BILITOT 0.9  PROT 7.7  ALBUMIN 4.2   BNP (last 3 results) Recent Labs    11/08/23 1343 12/14/23 1356  BNP 303.0* 249.0*   CBG: Recent Labs  Lab 12/14/23 2143 12/15/23 0746  GLUCAP 165* 119*   Recent Results (from the past 240 hours)  Resp panel by RT-PCR (RSV, Flu A&B, Covid) Anterior Nasal Swab     Status: None   Collection Time: 12/14/23  2:52 PM   Specimen: Anterior Nasal Swab  Result Value Ref Range Status   SARS Coronavirus 2 by RT PCR NEGATIVE NEGATIVE Final    Comment: (NOTE) SARS-CoV-2 target nucleic acids are NOT DETECTED.  The SARS-CoV-2 RNA is generally detectable in upper respiratory specimens during the acute phase of infection. The lowest concentration of SARS-CoV-2 viral copies this assay can detect is 138 copies/mL. A negative result does not preclude SARS-Cov-2 infection and should not be used as the sole basis for treatment or other patient management decisions. A negative result may occur with  improper specimen collection/handling, submission of specimen other than nasopharyngeal swab, presence of viral mutation(s) within the areas targeted by this assay, and inadequate number of viral copies(<138 copies/mL). A negative result must be combined with clinical observations, patient history, and epidemiological information. The expected result is  Negative.  Fact Sheet for Patients:  BloggerCourse.com  Fact Sheet for Healthcare Providers:  SeriousBroker.it  This test is no t yet approved or cleared by the [place] FDA and  has been authorized for detection and/or diagnosis of SARS-CoV-2 by FDA under an Emergency Use Authorization (EUA). This EUA will remain  in effect (meaning this test  can be used) for the duration of the COVID-19 declaration under Section 564(b)(1) of the Act, 21 U.S.C.section 360bbb-3(b)(1), unless the authorization is terminated  or revoked sooner.       Influenza A by PCR NEGATIVE NEGATIVE Final   Influenza B by PCR NEGATIVE NEGATIVE Final    Comment: (NOTE) The Xpert Xpress SARS-CoV-2/FLU/RSV plus assay is intended as an aid in the diagnosis of influenza from Nasopharyngeal swab specimens and should not be used as a sole basis for treatment. Nasal washings and aspirates are unacceptable for Xpert Xpress SARS-CoV-2/FLU/RSV testing.  Fact Sheet for Patients: BloggerCourse.com  Fact Sheet for Healthcare Providers: SeriousBroker.it  This test is not yet approved or cleared by the [place] FDA and has been authorized for detection and/or diagnosis of SARS-CoV-2 by FDA under an Emergency Use Authorization (EUA). This EUA will remain in effect (meaning this test can be used) for the duration of the COVID-19 declaration under Section 564(b)(1) of the Act, 21 U.S.C. section 360bbb-3(b)(1), unless the authorization is terminated or revoked.     Resp Syncytial Virus by PCR NEGATIVE NEGATIVE Final    Comment: (NOTE) Fact Sheet for Patients: BloggerCourse.com  Fact Sheet for Healthcare Providers: SeriousBroker.it  This test is not yet approved or cleared by the [place] FDA and has been authorized for detection and/or diagnosis of  SARS-CoV-2 by FDA under an Emergency Use Authorization (EUA). This EUA will remain in effect (meaning this test can be used) for the duration of the COVID-19 declaration under Section 564(b)(1) of the Act, 21 U.S.C. section 360bbb-3(b)(1), unless the authorization is terminated or revoked.  Performed at [facility], [address], [place], [place] [address]    Radiology Studies: CT Angio Chest PE W/Cm &/Or Wo Cm Result Date: 12/14/2023 CLINICAL DATA:  Generalized chest pain worse with deep inspiration EXAM: CT ANGIOGRAPHY CHEST WITH CONTRAST TECHNIQUE: Multidetector CT imaging of the chest was performed using the standard protocol during bolus administration of intravenous contrast. Multiplanar CT image reconstructions and MIPs were obtained to evaluate the vascular anatomy. RADIATION DOSE REDUCTION: This exam was performed according to the departmental dose-optimization program which includes automated exposure control, adjustment of the mA and/or kV according to patient size and/or use of iterative reconstruction technique. CONTRAST:  75mL OMNIPAQUE IOHEXOL 350 MG/ML SOLN COMPARISON:  Chest x-ray 12/14/2023, cardiac CT 08/29/2022 FINDINGS: Cardiovascular: Satisfactory opacification of the pulmonary arteries to the segmental level. No evidence of pulmonary embolism. Mild to moderate aortic atherosclerosis. No aneurysm. Watch min device in the left atrial appendage. Coronary vascular calcification. Cardiomegaly. Trace pericardial effusion Mediastinum/Nodes: Patent trachea. No thyroid mass. Mildly prominent AP window node measuring 12 mm. Small hilar nodes, on the right measuring to 13 mm. Right paratracheal node measuring 13 mm. Subcarinal lymph node measures 25 mm on series 4, image 50. Esophagus within normal limits. Lungs/Pleura: Small pleural effusions. No focal consolidation or pneumothorax. Mild mosaic density potentially due to small airways or small vessel disease. Scattered punctate pulmonary  nodules all measuring less than 5 mm, for example 3 mm superior right lower lobe pulmonary nodule on series 6, image 55. Mild subpleural reticulation. Upper Abdomen: No acute finding Musculoskeletal: No acute osseous abnormality Review of the MIP images confirms the above findings. IMPRESSION: 1. Negative for acute pulmonary embolus 2. Cardiomegaly with small pleural effusions. 3. Nonspecific mild mediastinal and right hilar adenopathy 4. Scattered punctate pulmonary nodules measuring less than 5 mm. No follow-up needed if patient is low-risk (and has no known or suspected primary  neoplasm). Non-contrast chest CT can be considered in 12 months if patient is high-risk. This recommendation follows the consensus statement: Guidelines for Management of Incidental Pulmonary Nodules Detected on CT Images: From the Fleischner Society 2017; Radiology 2017; 284:228-243. Aortic Atherosclerosis (ICD10-I70.0). Electronically Signed   By: Jasmine Pang M.D.   On: 12/14/2023 16:17   DG Chest Portable 1 View Result Date: 12/14/2023 CLINICAL DATA:  Chest pain. EXAM: PORTABLE CHEST 1 VIEW COMPARISON:  11/08/2023 FINDINGS: Technically limited due to habitus and AP technique. Chronic cardiomegaly, grossly unchanged allowing for differences in technique. Chronic elevation of right hemidiaphragm. Skin fold projects over the left hemithorax. Mild interstitial coarsening without focal airspace disease. No pneumothorax or large pleural effusion. Chronic right shoulder arthropathy. IMPRESSION: Stable cardiomegaly.  No acute radiographic findings. Electronically Signed   By: Narda Rutherford M.D.   On: 12/14/2023 13:59    Scheduled Meds:  acetaZOLAMIDE  250 mg Oral BID   amiodarone  200 mg Oral QHS   aspirin  81 mg Oral Daily   bumetanide (BUMEX) IV  2 mg Intravenous Q12H   enoxaparin (LOVENOX) injection  0.5 mg/kg Subcutaneous Q24H   fluticasone  2 spray Each Nare Daily   gemfibrozil  600 mg Oral QHS   hydrALAZINE  10 mg Oral  Q8H   insulin aspart  0-15 Units Subcutaneous TID WC   insulin aspart  0-5 Units Subcutaneous QHS   insulin glargine-yfgn  10 Units Subcutaneous QHS   metolazone  2.5 mg Oral Daily   pantoprazole  40 mg Oral Daily   potassium chloride  20 mEq Oral BID   Continuous Infusions:   LOS: 1 day   Time spent: 45 minutes  Carollee Herter, DO  [facility]  12/15/2023, 9:53 AM

## 2023-12-15 NOTE — Assessment & Plan Note (Signed)
 12-15-2023 pt may use hospital CPAP machine.  12-16-2023 stable.

## 2023-12-16 DIAGNOSIS — E876 Hypokalemia: Secondary | ICD-10-CM | POA: Diagnosis not present

## 2023-12-16 DIAGNOSIS — J9601 Acute respiratory failure with hypoxia: Secondary | ICD-10-CM | POA: Diagnosis not present

## 2023-12-16 DIAGNOSIS — I5043 Acute on chronic combined systolic (congestive) and diastolic (congestive) heart failure: Secondary | ICD-10-CM | POA: Diagnosis not present

## 2023-12-16 DIAGNOSIS — E66812 Obesity, class 2: Secondary | ICD-10-CM

## 2023-12-16 DIAGNOSIS — I10 Essential (primary) hypertension: Secondary | ICD-10-CM | POA: Diagnosis not present

## 2023-12-16 LAB — BASIC METABOLIC PANEL
Anion gap: 13 (ref 5–15)
BUN: 24 mg/dL — ABNORMAL HIGH (ref 8–23)
CO2: 30 mmol/L (ref 22–32)
Calcium: 9.1 mg/dL (ref 8.9–10.3)
Chloride: 92 mmol/L — ABNORMAL LOW (ref 98–111)
Creatinine, Ser: 1.53 mg/dL — ABNORMAL HIGH (ref 0.61–1.24)
GFR, Estimated: 46 mL/min — ABNORMAL LOW (ref >60)
Glucose, Bld: 178 mg/dL — ABNORMAL HIGH (ref 70–99)
Potassium: 3.6 mmol/L (ref 3.5–5.1)
Sodium: 135 mmol/L (ref 135–145)

## 2023-12-16 LAB — GLUCOSE, CAPILLARY
Glucose-Capillary: 175 mg/dL — ABNORMAL HIGH (ref 70–99)
Glucose-Capillary: 228 mg/dL — ABNORMAL HIGH (ref 70–99)
Glucose-Capillary: 251 mg/dL — ABNORMAL HIGH (ref 70–99)
Glucose-Capillary: 313 mg/dL — ABNORMAL HIGH (ref 70–99)

## 2023-12-16 LAB — MAGNESIUM: Magnesium: 1.9 mg/dL (ref 1.7–2.4)

## 2023-12-16 LAB — BRAIN NATRIURETIC PEPTIDE: B Natriuretic Peptide: 175 pg/mL — ABNORMAL HIGH (ref 0.0–100.0)

## 2023-12-16 MED ORDER — FUROSEMIDE 10 MG/ML IJ SOLN: 80.0000 mg | Freq: Every day | INTRAMUSCULAR | Status: DC

## 2023-12-16 MED ORDER — ENOXAPARIN SODIUM 60 MG/0.6ML IJ SOSY
60.0000 mg | PREFILLED_SYRINGE | INTRAMUSCULAR | Status: DC
  Administered 2023-12-16: 60 mg via SUBCUTANEOUS
  Filled 2023-12-16: qty 0.6

## 2023-12-16 NOTE — Plan of Care (Signed)

## 2023-12-16 NOTE — Assessment & Plan Note (Signed)
 Estimated body mass index is 35.04 kg/m as calculated from the following:   Height as of this encounter: 5\' 10"  (1.778 m).   Weight as of this encounter: 110.8 kg.

## 2023-12-16 NOTE — Progress Notes (Signed)
 PROGRESS NOTE    Lucas Dudley  WUJ:[id number] DOB: [birth year] DOA: 12/14/2023 PCP: Anabel Halon, MD  Subjective: Pt seen and examined.  Recorded 2.4 liter in urine output yesterday with 80 mg IV lasix bid, 2.5 mg metolazone qd and diamox 250 mg bid.  Echo yesterday shows LVEF 55-60%.  Less edema on Bilateral LE. Slept with hospital CPAP in recliner last night.  Needs PT evaluation.   Hospital Course: HPI: Lucas Dudley is a 83 y.o. male with medical history significant for systolic and diastolic CHF, hypertension, AVM, CAD, diabetes mellitus, OSA, paroxysmal atrial fibrillation. Patient presented to ED with complaints of chest pain, and bilateral lower extremity swelling with difficulty breathing.  Chest pain started this morning, constant, mid chest, nonradiating.  He reports onset of difficulty breathing mostly with exertion, and increasing bilateral lower extremity swelling , with intermittent abdominal bloating, that started several weeks ago.  Notes increase weight of at least 20 pounds from baseline of about 240s, to 260s now.  Saw his cardiologist 2/19, his dose of torsemide was increased from 40 to 60 mg, but for the past couple of days he has been taking up to 80 mg without improvement in swelling.   ED Course: O2 sats down to 86% on room air, on 3 L sats 91%.  Respiratory rate 16-20.  Heart rate 49- 59.  BNP 249.  Troponin 23 > 24. Chest X-ray without acute abnormality.  CTA chest negative for PE, small pleural effusions. Lasix 40 mg x 1 given.  Nitro paste applied. Hospitalist admit for CHF.  Significant Events: Admitted 12/14/2023 for acute respiratory failure with hypoxia, acute on chronic combined CHF   Significant Labs: WBC 11.5, HgB 11.3, Plt 186 Na 140, K 3.3, CO2 of 31, BUN 21, Scr 1.4 BNP 249  Significant Imaging Studies: CXR Stable cardiomegaly. No acute radiographic findings.  CTPA Negative for acute pulmonary embolus 2. Cardiomegaly with small pleural  effusions. 3. Nonspecific mild mediastinal and right hilar adenopathy 4. Scattered punctate pulmonary nodules measuring less than 5 mm.   Antibiotic Therapy: Anti-infectives (From admission, onward)    None       Procedures:   Consultants:     Assessment and Plan: * Acute hypoxic respiratory failure (HCC) On admission. O2 sats down to 85% on room air, currently on 3 L sats 91%.  Likely secondary to decompensated CHF, with CTA negative for PE, showing bilateral pleural effusions.  Clear lung exam.  COVID influenza RSV negative.  Quit smoking cigarettes almost 30 years ago.  12-15-2023 on my review of CTPA. Pt with minimal pleural effusions. Effusions not the cause of his hypoxia. 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  12-16-2023 weaned to RA.  Acute on chronic combined systolic and diastolic CHF (congestive heart failure) (HCC) On admission. Presenting with dyspnea on exertion, at least 2+ pitting bilateral lower extremity edema hypoxia.  BNP 249.  Per chart- weight gain over the past 6 weeks, per daughter up to 20 pound weight gain.  CT chest with cardiomegaly and small pleural effusions.  Has been compliant with torsemide 40 mg recently increased to 60 mg daily.  Last echo 05/2022 EF of 60 to 65%. -Good response in ED to 40 mg of Lasix, continue 40 twice daily -Obtain updated echocardiogram -Strict input output, daily weight, daily BMP -Reports midsternal chest pain, troponin 23 > 24, EKG unchanged.  12-15-2023 Pt states he weighs about 20 lbs more than baseline.  Not sure if that is  true. PCP office note from 11-11-2023 documents his weight at 116.6 kg. Cardiology office note from 12-04-2023 showed weight at 119.2 kg. Admission weight was 113.9 kg. Pt's documented weights are not correlating with his statements. Will change diuretics to Bumex 2 mg IV 12h. Add small dose of metolazone and diamox. He only has +1 pitting edema. R LE > Left LE. Repeat BMP and BNP in AM.  12-16-2023  unclear how accurate pt's weights are. Urine output was 2.4 liters yesterday but weight his down 3 kg.  Scr rising to 1.53(was 1.36 yesterday). BNP improved to 175.  Will reduced lasix to 80 mg IV today.  Reassess tomorrow. Hold on further metolazone, diamox.  Repeat BMP in AM.  Weight Information (since admission)     Date/Time Weight Weight in lbs BSA (Calculated - sq m) BMI (Calculated) Who   12/16/23 0437 110.8 kg 244.2 lbs -- 35.04 ST   12/15/23 0500 113.9 kg 251.1 lbs -- 36.03 XM   12/14/23 1235 117.1 kg 258.16 lbs 2.4 sq meters 37.04 BB         Pulmonary nodules On admission. Incidental finding on CTA chest today 3/1- scattered punctate pulmonary nodules measuring less than 5 mm. No follow-up needed if patient is low-risk (and has no known or suspected primary neoplasm). Non-contrast chest CT can be considered in 12 months if patient is high-risk. -Reports his quit smoking cigarettes about 30 years ago. -Follow-up with outpatient provider.  12-15-2023 stable.  12-16-2023 stable.  Hypokalemia On admission. Potassium 3.4.  Replete with oral KCL. -Check magnesium  12-15-2023 K 3.7 today. Add kcl replacement during aggressive diuresis.  12-16-2023 K of 3.6 today.  Essential hypertension On admission. Stable. -Hold lisinopril 40 mg daily with contrast exposure -Monitor with diuresis  12-15-2023 monitor BP during diuresis. Coreg was stopped by cardiology during 12-04-2023 office visit due concern for possible symptomatic bradycardia. Add hydralazine for elevated BP.  12-16-2023 BP improved with tid hydralazine. Not on coreg by outpatient cardiology due to concern for possible undiagnosed bradycardia.  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 Had Watchman device placed 06-28-2022   12-16-2023 stable.  AVM (arteriovenous malformation) of small bowel, acquired On admission. Hemoglobin stable at 11.3 g/dl. -Resume  aspirin  12-15-2023 hold oral iron for now as not to confuse any black stools with GI bleeding.  12-16-2023 stable. Continue to hold po iron.  OSA on CPAP 12-15-2023 pt may use hospital CPAP machine.  12-16-2023 stable.  Type 2 diabetes mellitus with other specified complication (HCC) On admission. 11/11/23- A1c 8.3.. -Resume home Lantus at reduced dose 10 units daily, (home dose 38 units nightly). - SSI- M  12-15-2023 monitor CBGs.  12-16-2023 stable. CBG acceptable ranges.  Paroxysmal atrial fibrillation (HCC) On admission. Currently in sinus rhythm.  On aspirin.Watchman device.  Follows with Dr. Diona Browner.  Not on anticoagulation. Hx of multiple AVMs. -Resume amiodarone. -Resume aspirin.  12-15-2023 stable.  12-16-2023 stable.  Obesity, Class II, BMI 35-39.9 Estimated body mass index is 35.04 kg/m as calculated from the following:   Height as of this encounter: 5\' 10"  (1.778 m).   Weight as of this encounter: 110.8 kg.    DVT prophylaxis: Place TED hose Start: 12/15/23 0956 Lovenox    Code Status: Full Code Family Communication: no family at bedside. Pt is decisional. Disposition Plan: return home Reason for continuing need for hospitalization: received IV lasix today. Repeat BMP in AM. Possible DC tomorrow.  Objective: Vitals:  12/15/23 2056 12/15/23 2101 12/16/23 0437 12/16/23 0539  BP: 137/60 137/60 (!) 117/59 (!) 117/59  Pulse: 65  60   Resp: 20  18   Temp: 98.4 F (36.9 C)  97.8 F (36.6 C)   TempSrc: Oral  Oral   SpO2: 90%  99%   Weight:   110.8 kg   Height:        Intake/Output Summary (Last 24 hours) at 12/16/2023 1037 Last data filed at 12/16/2023 1021 Gross per 24 hour  Intake 720 ml  Output 3400 ml  Net -2680 ml   Filed Weights   12/14/23 1235 12/15/23 0500 12/16/23 0437  Weight: 117.1 kg 113.9 kg 110.8 kg    Examination:  Physical Exam Vitals and nursing note reviewed.  Constitutional:      Appearance: He is obese.     Comments:  Looks better today. Better facial color. Breathing easy.  HENT:     Head: Normocephalic and atraumatic.     Nose: Nose normal.  Cardiovascular:     Rate and Rhythm: Normal rate and regular rhythm.  Pulmonary:     Effort: Pulmonary effort is normal. No respiratory distress.     Breath sounds: No wheezing.     Comments: Occ rales at bases. Abdominal:     General: Abdomen is protuberant. Bowel sounds are normal. There is no distension.     Palpations: Abdomen is soft.  Musculoskeletal:     Right lower leg: Edema present.     Left lower leg: Edema present.     Comments: Improved LE edema today.  Yesterday had +1-2 pitting edema of right lower leg. Today edema much improved and trace-+1 right LE edema.  Left leg edema also improved to just trace edema.  +1 pitting pedal edema. Pt has slept in recliner for 2 night in a row due to chronic back pain and pt's inability to lie flat on hospital bed.  Skin:    General: Skin is warm and dry.     Capillary Refill: Capillary refill takes less than 2 seconds.  Neurological:     Mental Status: He is alert and oriented to person, place, and time.    Data Reviewed: I have personally reviewed following labs and imaging studies  CBC: Recent Labs  Lab 12/14/23 1356 12/15/23 0439  WBC 11.5* 8.8  HGB 11.3* 10.2*  HCT 37.6* 34.1*  MCV 99.2 98.6  PLT 186 214   Basic Metabolic Panel: Recent Labs  Lab 12/14/23 1356 12/14/23 1533 12/15/23 0439 12/16/23 0645  NA 140  --  136 135  K 3.4*  --  3.7 3.6  CL 95*  --  98 92*  CO2 31  --  31 30  GLUCOSE 113*  --  134* 178*  BUN 21  --  17 24*  CREATININE 1.40*  --  1.36* 1.53*  CALCIUM 9.1  --  8.4* 9.1  MG  --  1.9  --  1.9   GFR: Estimated Creatinine Clearance: 48 mL/min (A) (by C-G formula based on SCr of 1.53 mg/dL (H)). Liver Function Tests: Recent Labs  Lab 12/14/23 1356  AST 21  ALT 15  ALKPHOS 53  BILITOT 0.9  PROT 7.7  ALBUMIN 4.2   BNP (last 3 results) Recent Labs     11/08/23 1343 12/14/23 1356 12/16/23 0645  BNP 303.0* 249.0* 175.0*   CBG: Recent Labs  Lab 12/15/23 0746 12/15/23 1146 12/15/23 1637 12/15/23 2110 12/16/23 0741  GLUCAP 119* 203* 202* 277* 175*  Recent Results (from the past 240 hours)  Resp panel by RT-PCR (RSV, Flu A&B, Covid) Anterior Nasal Swab     Status: None   Collection Time: 12/14/23  2:52 PM   Specimen: Anterior Nasal Swab  Result Value Ref Range Status   SARS Coronavirus 2 by RT PCR NEGATIVE NEGATIVE Final    Comment: (NOTE) SARS-CoV-2 target nucleic acids are NOT DETECTED.  The SARS-CoV-2 RNA is generally detectable in upper respiratory specimens during the acute phase of infection. The lowest concentration of SARS-CoV-2 viral copies this assay can detect is 138 copies/mL. A negative result does not preclude SARS-Cov-2 infection and should not be used as the sole basis for treatment or other patient management decisions. A negative result may occur with  improper specimen collection/handling, submission of specimen other than nasopharyngeal swab, presence of viral mutation(s) within the areas targeted by this assay, and inadequate number of viral copies(<138 copies/mL). A negative result must be combined with clinical observations, patient history, and epidemiological information. The expected result is Negative.  Fact Sheet for Patients:  BloggerCourse.com  Fact Sheet for Healthcare Providers:  SeriousBroker.it  This test is no t yet approved or cleared by the [place] FDA and  has been authorized for detection and/or diagnosis of SARS-CoV-2 by FDA under an Emergency Use Authorization (EUA). This EUA will remain  in effect (meaning this test can be used) for the duration of the COVID-19 declaration under Section 564(b)(1) of the Act, 21 U.S.C.section 360bbb-3(b)(1), unless the authorization is terminated  or revoked sooner.       Influenza A by  PCR NEGATIVE NEGATIVE Final   Influenza B by PCR NEGATIVE NEGATIVE Final    Comment: (NOTE) The Xpert Xpress SARS-CoV-2/FLU/RSV plus assay is intended as an aid in the diagnosis of influenza from Nasopharyngeal swab specimens and should not be used as a sole basis for treatment. Nasal washings and aspirates are unacceptable for Xpert Xpress SARS-CoV-2/FLU/RSV testing.  Fact Sheet for Patients: BloggerCourse.com  Fact Sheet for Healthcare Providers: SeriousBroker.it  This test is not yet approved or cleared by the [place] FDA and has been authorized for detection and/or diagnosis of SARS-CoV-2 by FDA under an Emergency Use Authorization (EUA). This EUA will remain in effect (meaning this test can be used) for the duration of the COVID-19 declaration under Section 564(b)(1) of the Act, 21 U.S.C. section 360bbb-3(b)(1), unless the authorization is terminated or revoked.     Resp Syncytial Virus by PCR NEGATIVE NEGATIVE Final    Comment: (NOTE) Fact Sheet for Patients: BloggerCourse.com  Fact Sheet for Healthcare Providers: SeriousBroker.it  This test is not yet approved or cleared by the [place] FDA and has been authorized for detection and/or diagnosis of SARS-CoV-2 by FDA under an Emergency Use Authorization (EUA). This EUA will remain in effect (meaning this test can be used) for the duration of the COVID-19 declaration under Section 564(b)(1) of the Act, 21 U.S.C. section 360bbb-3(b)(1), unless the authorization is terminated or revoked.  Performed at [facility], [address] [place], [place] [address]      Radiology Studies: ECHOCARDIOGRAM COMPLETE Result Date: 12/15/2023    ECHOCARDIOGRAM REPORT   Patient Name:   Lucas Dudley Date of Exam: 12/15/2023 Medical Rec #:  [id number]     Height:       70.0 in Accession #:    [id number]    Weight:       251.1 lb Date  of Birth:  16-Jan-1941  BSA:          2.299 m Patient Age:    82 years      BP:           155/75 mmHg Patient Gender: M             HR:           64 bpm. [facility] Procedure: 2D Echo, Cardiac Doppler, Color Doppler and Intracardiac            Opacification Agent (Both Spectral and Color Flow Doppler were            utilized during procedure). Indications:    CHF  History:        Patient has prior history of Echocardiogram examinations, most                 recent 06/06/2022. Cardiomyopathy, CAD; Risk Factors:Sleep Apnea,                 Former Smoker, Hypertension and Diabetes.  Sonographer:    Dondra Prader RVT RCS Referring Phys: [id number] [facility] IMPRESSIONS  1. Left ventricular ejection fraction, by estimation, is 55 to 60%. Left ventricular ejection fraction by PLAX is 59 %. The left ventricle has normal function. The left ventricle has no regional wall motion abnormalities. There is moderate concentric left ventricular hypertrophy. Left ventricular diastolic parameters are indeterminate.  2. Right ventricular systolic function is mildly reduced. The right ventricular size is normal. There is severely elevated pulmonary artery systolic pressure. The estimated right ventricular systolic pressure is 62.6 mmHg.  3. The mitral valve is grossly normal. No evidence of mitral valve regurgitation.  4. The aortic valve is tricuspid. Aortic valve regurgitation is not visualized. Aortic valve sclerosis/calcification is present, without any evidence of aortic stenosis.  5. The inferior vena cava is dilated in size with <50% respiratory variability, suggesting right atrial pressure of 15 mmHg. Comparison(s): Changes from prior study are noted. 06/06/2022: LVEF 60-65%, mild LVH, low normal RV function. FINDINGS  Left Ventricle: Left ventricular ejection fraction, by estimation, is 55 to 60%. Left ventricular ejection fraction by PLAX is 59 %. The left ventricle has normal function. The left ventricle  has no regional wall motion abnormalities. Definity contrast agent was given IV to delineate the left ventricular endocardial borders. Strain imaging was not performed. The left ventricular internal cavity size was normal in size. There is moderate concentric left ventricular hypertrophy. Left ventricular diastolic parameters are indeterminate. Right Ventricle: The right ventricular size is normal. No increase in right ventricular wall thickness. Right ventricular systolic function is mildly reduced. There is severely elevated pulmonary artery systolic pressure. The tricuspid regurgitant velocity is 3.45 m/s, and with an assumed right atrial pressure of 15 mmHg, the estimated right ventricular systolic pressure is 62.6 mmHg. Left Atrium: Left atrial size was normal in size. Right Atrium: Right atrial size was normal in size. Pericardium: Trivial pericardial effusion is present. The pericardial effusion is posterior to the left ventricle. Mitral Valve: The mitral valve is grossly normal. No evidence of mitral valve regurgitation. Tricuspid Valve: The tricuspid valve is not well visualized. Tricuspid valve regurgitation is not demonstrated. Aortic Valve: The aortic valve is tricuspid. Aortic valve regurgitation is not visualized. Aortic valve sclerosis/calcification is present, without any evidence of aortic stenosis. Aortic valve mean gradient measures 4.0 mmHg. Aortic valve peak gradient measures 6.8 mmHg. Aortic valve area, by VTI measures 1.59 cm. Pulmonic Valve: The pulmonic valve  was not well visualized. Pulmonic valve regurgitation is not visualized. Aorta: The aortic root and ascending aorta are structurally normal, with no evidence of dilitation. Venous: The inferior vena cava is dilated in size with less than 50% respiratory variability, suggesting right atrial pressure of 15 mmHg. IAS/Shunts: No atrial level shunt detected by color flow Doppler. Additional Comments: 3D imaging was not performed.  LEFT  VENTRICLE PLAX 2D LV EF:         Left            Diastology                ventricular     LV e' medial:    5.81 cm/s                ejection        LV E/e' medial:  13.9                fraction by     LV e' lateral:   8.81 cm/s                PLAX is 59      LV E/e' lateral: 9.1                %. LVIDd:         5.55 cm LVIDs:         3.80 cm LV PW:         1.65 cm LV IVS:        1.75 cm LVOT diam:     2.20 cm LV SV:         46 LV SV Index:   20 LVOT Area:     3.80 cm  RIGHT VENTRICLE            IVC RV Basal diam:  4.40 cm    IVC diam: 2.40 cm RV S prime:     8.93 cm/s TAPSE (M-mode): 1.8 cm LEFT ATRIUM             Index        RIGHT ATRIUM           Index LA diam:        4.30 cm 1.87 cm/m   RA Area:     20.00 cm LA Vol (A2C):   73.8 ml 32.10 ml/m  RA Volume:   58.90 ml  25.62 ml/m LA Vol (A4C):   54.1 ml 23.53 ml/m LA Biplane Vol: 64.3 ml 27.97 ml/m  AORTIC VALVE                    PULMONIC VALVE AV Area (Vmax):    1.83 cm     PV Vmax:       0.51 m/s AV Area (Vmean):   1.71 cm     PV Peak grad:  1.1 mmHg AV Area (VTI):     1.59 cm AV Vmax:           130.00 cm/s AV Vmean:          86.900 cm/s AV VTI:            0.289 m AV Peak Grad:      6.8 mmHg AV Mean Grad:      4.0 mmHg LVOT Vmax:         62.60 cm/s LVOT Vmean:        39.000 cm/s LVOT VTI:  0.121 m LVOT/AV VTI ratio: 0.42  AORTA Ao Root diam: 3.00 cm Ao Asc diam:  3.60 cm MITRAL VALVE               TRICUSPID VALVE MV Area (PHT): 4.41 cm    TR Peak grad:   47.6 mmHg MV Decel Time: 172 msec    TR Vmax:        345.00 cm/s MV E velocity: 80.60 cm/s MV A velocity: 16.30 cm/s  SHUNTS MV E/A ratio:  4.94        Systemic VTI:  0.12 m                            Systemic Diam: 2.20 cm Zoila Shutter MD Electronically signed by Zoila Shutter MD Signature Date/Time: 12/15/2023/11:41:54 AM    Final    CT Angio Chest PE W/Cm &/Or Wo Cm Result Date: 12/14/2023 CLINICAL DATA:  Generalized chest pain worse with deep inspiration EXAM: CT ANGIOGRAPHY CHEST WITH  CONTRAST TECHNIQUE: Multidetector CT imaging of the chest was performed using the standard protocol during bolus administration of intravenous contrast. Multiplanar CT image reconstructions and MIPs were obtained to evaluate the vascular anatomy. RADIATION DOSE REDUCTION: This exam was performed according to the departmental dose-optimization program which includes automated exposure control, adjustment of the mA and/or kV according to patient size and/or use of iterative reconstruction technique. CONTRAST:  75mL OMNIPAQUE IOHEXOL 350 MG/ML SOLN COMPARISON:  Chest x-ray 12/14/2023, cardiac CT 08/29/2022 FINDINGS: Cardiovascular: Satisfactory opacification of the pulmonary arteries to the segmental level. No evidence of pulmonary embolism. Mild to moderate aortic atherosclerosis. No aneurysm. Watch min device in the left atrial appendage. Coronary vascular calcification. Cardiomegaly. Trace pericardial effusion Mediastinum/Nodes: Patent trachea. No thyroid mass. Mildly prominent AP window node measuring 12 mm. Small hilar nodes, on the right measuring to 13 mm. Right paratracheal node measuring 13 mm. Subcarinal lymph node measures 25 mm on series 4, image 50. Esophagus within normal limits. Lungs/Pleura: Small pleural effusions. No focal consolidation or pneumothorax. Mild mosaic density potentially due to small airways or small vessel disease. Scattered punctate pulmonary nodules all measuring less than 5 mm, for example 3 mm superior right lower lobe pulmonary nodule on series 6, image 55. Mild subpleural reticulation. Upper Abdomen: No acute finding Musculoskeletal: No acute osseous abnormality Review of the MIP images confirms the above findings. IMPRESSION: 1. Negative for acute pulmonary embolus 2. Cardiomegaly with small pleural effusions. 3. Nonspecific mild mediastinal and right hilar adenopathy 4. Scattered punctate pulmonary nodules measuring less than 5 mm. No follow-up needed if patient is low-risk (and  has no known or suspected primary neoplasm). Non-contrast chest CT can be considered in 12 months if patient is high-risk. This recommendation follows the consensus statement: Guidelines for Management of Incidental Pulmonary Nodules Detected on CT Images: From the Fleischner Society 2017; Radiology 2017; 284:228-243. Aortic Atherosclerosis (ICD10-I70.0). Electronically Signed   By: Jasmine Pang M.D.   On: 12/14/2023 16:17   DG Chest Portable 1 View Result Date: 12/14/2023 CLINICAL DATA:  Chest pain. EXAM: PORTABLE CHEST 1 VIEW COMPARISON:  11/08/2023 FINDINGS: Technically limited due to habitus and AP technique. Chronic cardiomegaly, grossly unchanged allowing for differences in technique. Chronic elevation of right hemidiaphragm. Skin fold projects over the left hemithorax. Mild interstitial coarsening without focal airspace disease. No pneumothorax or large pleural effusion. Chronic right shoulder arthropathy. IMPRESSION: Stable cardiomegaly.  No acute radiographic findings. Electronically Signed   By: Shawna Orleans  Sanford M.D.   On: 12/14/2023 13:59    Scheduled Meds:  amiodarone  200 mg Oral QHS   aspirin  81 mg Oral Daily   enoxaparin (LOVENOX) injection  60 mg Subcutaneous Q24H   fluticasone  2 spray Each Nare Daily   gemfibrozil  600 mg Oral QHS   hydrALAZINE  10 mg Oral Q8H   insulin aspart  0-15 Units Subcutaneous TID WC   insulin aspart  0-5 Units Subcutaneous QHS   insulin glargine-yfgn  10 Units Subcutaneous QHS   pantoprazole  40 mg Oral Daily   potassium chloride  20 mEq Oral BID   Continuous Infusions:   LOS: 2 days   Time spent: 40 minutes  Carollee Herter, DO  [facility]  12/16/2023, 10:37 AM

## 2023-12-16 NOTE — Progress Notes (Signed)
 Mobility Specialist Progress Note:    12/16/23 1140  Mobility  Activity Ambulated with assistance in hallway  Level of Assistance Standby assist, set-up cues, supervision of patient - no hands on  Assistive Device None  Distance Ambulated (ft) 140 ft  Range of Motion/Exercises Active;All extremities  Activity Response Tolerated well  Mobility Referral Yes  Mobility visit 1 Mobility  Mobility Specialist Start Time (ACUTE ONLY) 1140  Mobility Specialist Stop Time (ACUTE ONLY) 1200  Mobility Specialist Time Calculation (min) (ACUTE ONLY) 20 min   Pt received in chair, agreeable to mobility. Required SBA to stand and ambulate with no AD. Tolerated well, O2 sats below. Returned pt to chair, all needs met.  SpO2 93% on RA at rest. SpO2 86% on RA during ambulation. SpO2 94% on 2L during ambulation.  Lucas Dudley Mobility Specialist Please contact via Special educational needs teacher or  Rehab office at [phone]

## 2023-12-16 NOTE — TOC Initial Note (Signed)
 Transition of Care [facility]) - Initial/Assessment Note    Patient Details  Name: Lucas Dudley MRN: [id number] Date of Birth: 03-Dec-1940  Transition of Care [facility]) CM/SW Contact:    Beather Arbour Phone Number: 12/16/2023, 12:36 PM  Clinical Narrative:                 CSW spoke with patient at bedside. Patient stated that he lives alone , still drives, and independent with everything. He also states that he has a cane in his truck just in case he needs to use it. Patient has no oxygen at home and states that he does not need any home health. TOC will continue to follow.      Expected Discharge Plan: Home/Self Care Barriers to Discharge: Continued Medical Work up   Patient Goals and CMS Choice Patient states their goals for this hospitalization and ongoing recovery are:: return back home CMS Medicare.gov Compare Post Acute Care list provided to:: Patient Choice offered to / list presented to : Patient      Expected Discharge Plan and Services In-house Referral: Clinical Social Work   Post Acute Care Choice: Durable Medical Equipment Living arrangements for the past 2 months: Single Family Home                 DME Arranged: N/A DME Agency: NA       HH Arranged: Refused HH HH Agency: NA        Prior Living Arrangements/Services Living arrangements for the past 2 months: Single Family Home Lives with:: Self Patient language and need for interpreter reviewed:: Yes Do you feel safe going back to the place where you live?: Yes      Need for Family Participation in Patient Care: No (Comment) (Patient is independent) Care giver support system in place?: No (comment) (Patient is independent)   Criminal Activity/Legal Involvement Pertinent to Current Situation/Hospitalization: No - Comment as needed  Activities of Daily Living   ADL Screening (condition at time of admission) Independently performs ADLs?: Yes (appropriate for developmental age) Is the patient deaf or  have difficulty hearing?: No Does the patient have difficulty seeing, even when wearing glasses/contacts?: No Does the patient have difficulty concentrating, remembering, or making decisions?: No  Permission Sought/Granted      Share Information with NAME: Lucas Dudley     Permission granted to share info w Relationship: Patient     Emotional Assessment Appearance:: Appears stated age Attitude/Demeanor/Rapport: Engaged Affect (typically observed): Accepting, Appropriate Orientation: : Oriented to Self, Oriented to Place, Oriented to  Time, Oriented to Situation Alcohol / Substance Use: Not Applicable Psych Involvement: No (comment)  Admission diagnosis:  Elevated troponin [R79.89] Acute respiratory failure with hypoxia (HCC) [J96.01] Acute congestive heart failure, unspecified heart failure type (HCC) [I50.9] Acute hypoxic respiratory failure (HCC) [J96.01] Acute on chronic combined systolic (congestive) and diastolic (congestive) heart failure (HCC) [I50.43] Patient Active Problem List   Diagnosis Date Noted   Acute hypoxic respiratory failure (HCC) 12/14/2023   Hypokalemia 12/14/2023   Pulmonary nodules 12/14/2023   Diabetic neuropathy (HCC) 11/15/2023   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   Encounter for general adult medical examination with abnormal findings 08/08/2023   Statin myopathy 07/18/2023   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, associated with type 2 diabetes mellitus (HCC) 07/18/2023   Allergic rhinitis 07/18/2023   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 06-28-2022 06/28/2022   Anemia due to chronic blood loss 06/08/2022  AVM (arteriovenous malformation) of small bowel, acquired    Obesity, Class II, BMI 35-39.9 05/22/2022   Constipation    Secondary hypercoagulable state (HCC) 09/18/2021   History of colonic polyps 01/22/2017   Family hx of colorectal cancer 01/22/2017   Acute on chronic  combined systolic and diastolic CHF (congestive heart failure) (HCC) 05/17/2016   CAD in native artery 05/17/2016   Cardiomyopathy, dilated, 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   OSA on CPAP 04/25/2015   Chronic anticoagulation    Shortness of breath 01/18/2015   Paroxysmal atrial fibrillation (HCC) 07/06/2014   Calculus of kidney 06/14/2014   Type 2 diabetes mellitus with other specified complication (HCC) 06/14/2014   CA of prostate (HCC) 06/14/2014   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/14/2014   Essential hypertension 07/10/2013   Hyperlipidemia 07/10/2013   PCP:  Anabel Halon, MD Pharmacy:   [phone] [place], [place] - [address] AT [facility] [address] [place] [address] Phone: [phone] Fax: [phone]  [facility] [place], [place] - [address] [facility] AT [facility] to [facility] [address] [address] Phone: [phone] Fax: [phone]     Social Drivers of Health (SDOH) Social History: SDOH Screenings   Food Insecurity: No Food Insecurity (12/14/2023)  Housing: Low Risk  (12/14/2023)  Transportation Needs: No Transportation Needs (12/14/2023)  Utilities: Not At Risk (12/14/2023)  Alcohol Screen: Low Risk  (11/20/2023)  Depression (PHQ2-9): Low Risk  (11/20/2023)  Financial Resource Strain: Low Risk  (11/20/2023)  Physical Activity: Inactive (11/20/2023)  Social Connections: Moderately Integrated (12/14/2023)  Recent Concern: Social Connections - Moderately Isolated (11/20/2023)  Stress: No Stress Concern Present (11/20/2023)  Tobacco Use: Medium Risk (12/14/2023)  Health Literacy: Adequate Health Literacy (11/20/2023)   SDOH Interventions:     Readmission Risk Interventions    12/16/2023   12:34 PM 12/15/2023    5:49 PM 06/01/2022   11:02 AM  Readmission Risk Prevention Plan  Transportation Screening Complete Complete Complete  PCP or Specialist Appt within 3-5 Days   Complete Complete  Home Care Screening Complete    Medication Review (RN CM) Complete    HRI or Home Care Consult   Complete  Social Work Consult for Recovery Care Planning/Counseling  Complete Complete  Palliative Care Screening  Complete Complete  Medication Review Oceanographer)  Complete Complete

## 2023-12-17 ENCOUNTER — Encounter (HOSPITAL_COMMUNITY): Payer: Self-pay | Admitting: Internal Medicine

## 2023-12-17 ENCOUNTER — Inpatient Hospital Stay: Payer: Medicare Other | Admitting: Physician Assistant

## 2023-12-17 DIAGNOSIS — I10 Essential (primary) hypertension: Secondary | ICD-10-CM | POA: Diagnosis not present

## 2023-12-17 DIAGNOSIS — I5042 Chronic combined systolic (congestive) and diastolic (congestive) heart failure: Principal | ICD-10-CM | POA: Insufficient documentation

## 2023-12-17 DIAGNOSIS — N1831 Chronic kidney disease, stage 3a: Secondary | ICD-10-CM | POA: Insufficient documentation

## 2023-12-17 DIAGNOSIS — J9601 Acute respiratory failure with hypoxia: Secondary | ICD-10-CM | POA: Diagnosis not present

## 2023-12-17 DIAGNOSIS — I5043 Acute on chronic combined systolic (congestive) and diastolic (congestive) heart failure: Secondary | ICD-10-CM | POA: Diagnosis not present

## 2023-12-17 LAB — BASIC METABOLIC PANEL
Anion gap: 9 (ref 5–15)
BUN: 28 mg/dL — ABNORMAL HIGH (ref 8–23)
CO2: 31 mmol/L (ref 22–32)
Calcium: 9.1 mg/dL (ref 8.9–10.3)
Chloride: 95 mmol/L — ABNORMAL LOW (ref 98–111)
Creatinine, Ser: 1.72 mg/dL — ABNORMAL HIGH (ref 0.61–1.24)
GFR, Estimated: 40 mL/min — ABNORMAL LOW (ref >60)
Glucose, Bld: 152 mg/dL — ABNORMAL HIGH (ref 70–99)
Potassium: 3.9 mmol/L (ref 3.5–5.1)
Sodium: 135 mmol/L (ref 135–145)

## 2023-12-17 LAB — BRAIN NATRIURETIC PEPTIDE: B Natriuretic Peptide: 111 pg/mL — ABNORMAL HIGH (ref 0.0–100.0)

## 2023-12-17 LAB — GLUCOSE, CAPILLARY: Glucose-Capillary: 203 mg/dL — ABNORMAL HIGH (ref 70–99)

## 2023-12-17 LAB — MAGNESIUM: Magnesium: 2 mg/dL (ref 1.7–2.4)

## 2023-12-17 MED ORDER — TORSEMIDE 20 MG PO TABS: ORAL_TABLET | ORAL | Status: DC

## 2023-12-17 MED ORDER — HYDRALAZINE HCL 10 MG PO TABS: 10.0000 mg | ORAL_TABLET | Freq: Three times a day (TID) | ORAL | 0 refills | Status: DC

## 2023-12-17 NOTE — Plan of Care (Signed)

## 2023-12-17 NOTE — Assessment & Plan Note (Addendum)
 12-17-2023 Discharge BUN/Scr 28/1.72. will hold ACEI at discharge until seen in cardiology f/u clinic.  Hold diuretics today. Restart demadex tomorrow. 40 mg in the morning and 20 mg in the afternoon

## 2023-12-17 NOTE — Discharge Summary (Signed)
 [facility] Phys[facility] Discharge Summary   Patient name: Lucas Dudley  Admit date:     12/14/2023  Discharge date: 12/17/2023  Attending Physician: Onnie Boer [[id number]]  Discharge Physician: Carollee Herter   PCP: Anabel Halon, MD  Admitted From: Home  Disposition:  Home  Recommendations for Outpatient Follow-up:  Follow up with PCP in 1-2 weeks Follow up with cardiology in clinic in 2-4 weeks to consider restarting ACEI. Repeat BMP to monitor BUN/Scr.  Home Health:No Equipment/Devices: None  Discharge Condition:Stable CODE STATUS:FULL Diet recommendation: Diabetic Fluid Restriction: None  Hospital Summary: HPI: Lucas Dudley is a 83 y.o. male with medical history significant for systolic and diastolic CHF, hypertension, AVM, CAD, diabetes mellitus, OSA, paroxysmal atrial fibrillation. Patient presented to ED with complaints of chest pain, and bilateral lower extremity swelling with difficulty breathing.  Chest pain started this morning, constant, mid chest, nonradiating.  He reports onset of difficulty breathing mostly with exertion, and increasing bilateral lower extremity swelling , with intermittent abdominal bloating, that started several weeks ago.  Notes increase weight of at least 20 pounds from baseline of about 240s, to 260s now.  Saw his cardiologist 2/19, his dose of torsemide was increased from 40 to 60 mg, but for the past couple of days he has been taking up to 80 mg without improvement in swelling.   ED Course: O2 sats down to 86% on room air, on 3 L sats 91%.  Respiratory rate 16-20.  Heart rate 49- 59.  BNP 249.  Troponin 23 > 24. Chest X-ray without acute abnormality.  CTA chest negative for PE, small pleural effusions. Lasix 40 mg x 1 given.  Nitro paste applied. Hospitalist admit for CHF.  Significant Events: Admitted 12/14/2023 for acute respiratory failure with hypoxia, acute on chronic combined CHF   Significant Labs: WBC 11.5, HgB 11.3, Plt  186 Na 140, K 3.3, CO2 of 31, BUN 21, Scr 1.4 BNP 249  Significant Imaging Studies: CXR Stable cardiomegaly. No acute radiographic findings.  CTPA Negative for acute pulmonary embolus 2. Cardiomegaly with small pleural effusions. 3. Nonspecific mild mediastinal and right hilar adenopathy 4. Scattered punctate pulmonary nodules measuring less than 5 mm.   Antibiotic Therapy: Anti-infectives (From admission, onward)    None       Procedures:   Consultants:    Hospital Course by Problem: * Acute hypoxic respiratory failure (HCC) On admission. O2 sats down to 85% on room air, currently on 3 L sats 91%.  Likely secondary to decompensated CHF, with CTA negative for PE, showing bilateral pleural effusions.  Clear lung exam.  COVID influenza RSV negative.  Quit smoking cigarettes almost 30 years ago.  12-15-2023 on my review of CTPA. Pt with minimal pleural effusions. Effusions not the cause of his hypoxia. 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  12-16-2023 weaned to RA.  Acute on chronic combined systolic and diastolic CHF (congestive heart failure) (HCC) - dry Weight 109 kg/240.3 lbs. dry weight BNP of 111 On admission. Presenting with dyspnea on exertion, at least 2+ pitting bilateral lower extremity edema hypoxia.  BNP 249.  Per chart- weight gain over the past 6 weeks, per daughter up to 20 pound weight gain.  CT chest with cardiomegaly and small pleural effusions.  Has been compliant with torsemide 40 mg recently increased to 60 mg daily.  Last echo 05/2022 EF of 60 to 65%. -Good response in ED to 40 mg of Lasix, continue 40 twice daily -Obtain updated  echocardiogram -Strict input output, daily weight, daily BMP -Reports midsternal chest pain, troponin 23 > 24, EKG unchanged.  12-15-2023 Pt states he weighs about 20 lbs more than baseline.  Not sure if that is true. PCP office note from 11-11-2023 documents his weight at 116.6 kg. Cardiology office note from 12-04-2023 showed  weight at 119.2 kg. Admission weight was 113.9 kg. Pt's documented weights are not correlating with his statements. Will change diuretics to Bumex 2 mg IV 12h. Add small dose of metolazone and diamox. He only has +1 pitting edema. R LE > Left LE. Repeat BMP and BNP in AM.  12-16-2023 unclear how accurate pt's weights are. Urine output was 2.4 liters yesterday but weight his down 3 kg.  Scr rising to 1.53(was 1.36 yesterday). BNP improved to 175.  Will reduced lasix to 80 mg IV today.  Reassess tomorrow. Hold on further metolazone, diamox.  Repeat BMP in AM.  Weight Information (since admission)     Date/Time Weight Weight in lbs BSA (Calculated - sq m) BMI (Calculated) Who   12/16/23 0437 110.8 kg 244.2 lbs -- 35.04 ST   12/15/23 0500 113.9 kg 251.1 lbs -- 36.03 XM   12/14/23 1235 117.1 kg 258.16 lbs 2.4 sq meters 37.04 BB   12-17-2023 Had another 1.6 liters in urine yesterday. Net negative 3.84 liters since admission. Weight today 109 kg. 240.3 lbs. This will be his dry weight. Dry weight BNP of 111   Ready to go home Remains off O2. Slight increase in Scr to 1.7. pt will hold diuretics today and restart demadex tomorrow. Pt admits he bought a country ham and was eating country ham for 2-3 days prior to admission. Likely dietary indiscretion and salt loading as the cause for his CHF exacerbation. Discharge BUN/Scr 28/1.72. will hold ACEI at discharge until seen in cardiology f/u clinic.  Hold diuretics today. Restart demadex tomorrow. 40 mg in the morning and 20 mg in the afternoon   Weight Information (since admission)     Date/Time Weight Weight in lbs BSA (Calculated - sq m) BMI (Calculated) Who   12/17/23 0709 109 kg 240.3 lbs -- 34.48 TH   12/16/23 0437 110.8 kg 244.2 lbs -- 35.04 ST   12/15/23 0500 113.9 kg 251.1 lbs -- 36.03 XM   12/14/23 1235 117.1 kg 258.16 lbs 2.4 sq meters 37.04 BB    Pulmonary nodules On admission. Incidental finding on CTA chest today 3/1- scattered punctate  pulmonary nodules measuring less than 5 mm. No follow-up needed if patient is low-risk (and has no known or suspected primary neoplasm). Non-contrast chest CT can be considered in 12 months if patient is high-risk. -Reports his quit smoking cigarettes about 30 years ago. -Follow-up with outpatient provider.  12-15-2023 stable.  12-16-2023 stable.  Hypokalemia On admission. Potassium 3.4.  Replete with oral KCL. -Check magnesium  12-15-2023 K 3.7 today. Add kcl replacement during aggressive diuresis.  12-16-2023 K of 3.6 today.  12-17-2023 Discharge K of 3.9  Essential hypertension On admission. Stable. -Hold lisinopril 40 mg daily with contrast exposure -Monitor with diuresis  12-15-2023 monitor BP during diuresis. Coreg was stopped by cardiology during 12-04-2023 office visit due concern for possible symptomatic bradycardia. Add hydralazine for elevated BP.  12-16-2023 BP improved with tid hydralazine. Not on coreg by outpatient cardiology due to concern for possible undiagnosed bradycardia.  12-17-2023 hold ACEIat discharge due to CKD 3a. Continue with hydralazine 10 mg tid. F/u with cardiology in 2 weeks to either continue hydralazine  or restart ACEI.  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 Had Watchman device placed 06-28-2022   12-16-2023 stable.  AVM (arteriovenous malformation) of small bowel, acquired On admission. Hemoglobin stable at 11.3 g/dl. -Resume aspirin  12-15-2023 hold oral iron for now as not to confuse any black stools with GI bleeding.  12-16-2023 stable. Continue to hold po iron.  OSA on CPAP 12-15-2023 pt may use hospital CPAP machine.  12-16-2023 stable.  Type 2 diabetes mellitus with other specified complication (HCC) On admission. 11/11/23- A1c 8.3.. -Resume home Lantus at reduced dose 10 units daily, (home dose 38 units nightly). - SSI- M  12-15-2023 monitor CBGs.  12-16-2023 stable. CBG  acceptable ranges.  Paroxysmal atrial fibrillation (HCC) On admission. Currently in sinus rhythm.  On aspirin.Watchman device.  Follows with Dr. Diona Browner.  Not on anticoagulation. Hx of multiple AVMs. -Resume amiodarone. -Resume aspirin.  12-15-2023 stable.  12-16-2023 stable.  CKD stage 3a, GFR 45-59 ml/min (HCC) - baseline Scr 1.2-1.4 12-17-2023 Discharge BUN/Scr 28/1.72. will hold ACEI at discharge until seen in cardiology f/u clinic.  Hold diuretics today. Restart demadex tomorrow. 40 mg in the morning and 20 mg in the afternoon   Chronic combined systolic and diastolic CHF (congestive heart failure) (HCC) - dry Weight 109 kg/240.3 lbs. dry weight BNP of 111 12-17-2023 coreg stopped by cards due to concern for undiagnosed bradycardia. Will need to hold ARB due to rise in Scr due to diuresis. DC to home with hydralazine 10 mg tid. Restart demadex tomorrow 40 qam 20 mg qpm. F/u with cards in office in 2-4 weeks to consider restarting ACEI(after repeat BMP) vs continuing with hydralazine.  Obesity, Class II, BMI 35-39.9 Estimated body mass index is 35.04 kg/m as calculated from the following:   Height as of this encounter: 5\' 10"  (1.778 m).   Weight as of this encounter: 110.8 kg.     Discharge Diagnoses:  Principal Problem:   Acute hypoxic respiratory failure (HCC) Active Problems:   Acute on chronic combined systolic and diastolic CHF (congestive heart failure) (HCC) - dry Weight 109 kg/240.3 lbs. dry weight BNP of 111   Essential hypertension   Hypokalemia   Pulmonary nodules   Paroxysmal atrial fibrillation (HCC)   Type 2 diabetes mellitus with other specified complication (HCC)   OSA on CPAP   AVM (arteriovenous malformation) of small bowel, acquired   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 06-28-2022   Obesity, Class II, BMI 35-39.9   Chronic combined systolic and diastolic CHF (congestive heart failure) (HCC) - dry Weight 109 kg/240.3 lbs. dry  weight BNP of 111   CKD stage 3a, GFR 45-59 ml/min (HCC) - baseline Scr 1.2-1.4   Discharge Instructions  Discharge Instructions     (HEART FAILURE PATIENTS) Call MD:  Anytime you have any of the following symptoms: 1) 3 pound weight gain in 24 hours or 5 pounds in 1 week 2) shortness of breath, with or without a dry hacking cough 3) swelling in the hands, feet or stomach 4) if you have to sleep on extra pillows at night in order to breathe.   Complete by: As directed    Ambulatory referral to Cardiology   Complete by: As directed    Hospital followup in 2-4 weeks for combined CHF management, CKD stage 3a   Call MD for:  difficulty breathing, headache or visual disturbances   Complete by: As directed    Call MD for:  extreme fatigue   Complete by: As directed    Call MD for:  hives   Complete by: As directed    Call MD for:  persistant dizziness or light-headedness   Complete by: As directed    Call MD for:  persistant nausea and vomiting   Complete by: As directed    Call MD for:  redness, tenderness, or signs of infection (pain, swelling, redness, odor or green/yellow discharge around incision site)   Complete by: As directed    Call MD for:  severe uncontrolled pain   Complete by: As directed    Call MD for:  temperature >100.4   Complete by: As directed    Diet - low sodium heart healthy   Complete by: As directed    Diet Carb Modified   Complete by: As directed    Discharge instructions   Complete by: As directed    1. Follow up with your primary care provider in 1-2 weeks following discharge from hospital. 2. Outpatient referral to have cardiology appointment scheduled in 2-4 weeks   Increase activity slowly   Complete by: As directed       Allergies as of 12/17/2023       Reactions   Azithromycin Nausea Only   Tape Rash, Other (See Comments)   Causes skin redness, Use paper tape only.        Medication List     PAUSE taking these medications    lisinopril  40 MG tablet Wait to take this until your doctor or other care provider tells you to start again. Follow up with cardiology before starting this medication Commonly known as: ZESTRIL Take 1 tablet (40 mg total) by mouth daily.       TAKE these medications    acetaminophen 650 MG CR tablet Commonly known as: TYLENOL Take 1,300 mg by mouth at bedtime.   amiodarone 200 MG tablet Commonly known as: PACERONE TAKE 1 TABLET BY MOUTH EVERY DAY What changed: when to take this   ammonium lactate 12 % lotion Commonly known as: Amlactin Daily Apply 1 Application topically as needed.   Anti-Itch lotion Generic drug: camphor-menthol Apply 1 Application topically daily as needed for itching.   aspirin 81 MG chewable tablet Chew 81 mg by mouth daily.   Basaglar KwikPen 100 UNIT/ML Inject 45 Units into the skin at bedtime. What changed: how much to take   Combigan 0.2-0.5 % ophthalmic solution Generic drug: brimonidine-timolol Place 1 drop into both eyes 2 (two) times daily.   DRY EYES OP Place 1 drop into both eyes daily as needed (Dry eye).   ELDERBERRY PO Take 2 tablets by mouth daily. Chewable   ferrous sulfate 325 (65 FE) MG tablet Take 1 tablet (325 mg total) by mouth daily with breakfast.   fluticasone 50 MCG/ACT nasal spray Commonly known as: FLONASE SPRAY 2 SPRAYS INTO EACH NOSTRIL EVERY DAY   gemfibrozil 600 MG tablet Commonly known as: LOPID Take 600 mg by mouth at bedtime.   glimepiride 2 MG tablet Commonly known as: AMARYL Take 1 tablet (2 mg total) by mouth 2 (two) times daily with a meal. What changed: when to take this   hydrALAZINE 10 MG tablet Commonly known as: APRESOLINE Take 1 tablet (10 mg total) by mouth 3 (three) times daily.   magnesium gluconate 500 MG tablet Commonly known as: MAGONATE Take 500 mg by mouth daily.   metFORMIN 500 MG 24 hr tablet Commonly known as: GLUCOPHAGE-XR Take 1,000 mg  by mouth in the morning and at bedtime.    mineral oil liquid Take 15 mLs by mouth daily as needed for moderate constipation.   OVER THE COUNTER MEDICATION Take 1 tablet by mouth daily. Beets Chew   pantoprazole 40 MG tablet Commonly known as: PROTONIX TAKE 1 TABLET BY MOUTH EVERY DAY   potassium chloride 10 MEQ tablet Commonly known as: KLOR-CON Take 1 tablet (10 mEq total) by mouth daily.   torsemide 20 MG tablet Commonly known as: DEMADEX 40 Mg in the morning and 20 Mg in the evening Start taking on: December 18, 2023 What changed:  how much to take how to take this when to take this additional instructions   zinc gluconate 50 MG tablet Take 50 mg by mouth daily.        Allergies  Allergen Reactions   Azithromycin Nausea Only   Tape Rash and Other (See Comments)    Causes skin redness, Use paper tape only.    Discharge Exam: Vitals:   12/17/23 0517 12/17/23 0518  BP: (!) 120/48 (!) 120/48  Pulse:  64  Resp:    Temp:  98.2 F (36.8 C)  SpO2:  96%   Weight Information (since admission)     Date/Time Weight Weight in lbs BSA (Calculated - sq m) BMI (Calculated) Who   12/17/23 0709 109 kg 240.3 lbs -- 34.48 TH   12/16/23 0437 110.8 kg 244.2 lbs -- 35.04 ST   12/15/23 0500 113.9 kg 251.1 lbs -- 36.03 XM   12/14/23 1235 117.1 kg 258.16 lbs 2.4 sq meters 37.04 BB        Physical Exam Vitals and nursing note reviewed.  Constitutional:      General: He is not in acute distress.    Appearance: He is obese. He is not toxic-appearing or diaphoretic.  HENT:     Head: Normocephalic and atraumatic.     Nose: Nose normal.  Eyes:     General: No scleral icterus. Cardiovascular:     Rate and Rhythm: Normal rate and regular rhythm.     Pulses: Normal pulses.  Pulmonary:     Effort: Pulmonary effort is normal. No respiratory distress.     Breath sounds: Normal breath sounds. No wheezing or rales.  Abdominal:     General: Abdomen is protuberant. Bowel sounds are normal. There is no distension.      Palpations: Abdomen is soft.     Tenderness: There is no abdominal tenderness. There is no guarding or rebound.  Musculoskeletal:     Comments: Calves are soft. Only trace pretibial pitting edema bilaterally Chronic +1 bilateral dorsal pedal edema. Unchanged.  Skin:    General: Skin is warm and dry.     Capillary Refill: Capillary refill takes less than 2 seconds.  Neurological:     Mental Status: He is alert and oriented to person, place, and time.     The results of significant diagnostics from this hospitalization (including imaging, microbiology, ancillary and laboratory) are listed below for reference.    Microbiology: Recent Results (from the past 240 hours)  Resp panel by RT-PCR (RSV, Flu A&B, Covid) Anterior Nasal Swab     Status: None   Collection Time: 12/14/23  2:52 PM   Specimen: Anterior Nasal Swab  Result Value Ref Range Status   SARS Coronavirus 2 by RT PCR NEGATIVE NEGATIVE Final    Comment: (NOTE) SARS-CoV-2 target nucleic acids are NOT DETECTED.  The SARS-CoV-2 RNA is generally detectable in upper  respiratory specimens during the acute phase of infection. The lowest concentration of SARS-CoV-2 viral copies this assay can detect is 138 copies/mL. A negative result does not preclude SARS-Cov-2 infection and should not be used as the sole basis for treatment or other patient management decisions. A negative result may occur with  improper specimen collection/handling, submission of specimen other than nasopharyngeal swab, presence of viral mutation(s) within the areas targeted by this assay, and inadequate number of viral copies(<138 copies/mL). A negative result must be combined with clinical observations, patient history, and epidemiological information. The expected result is Negative.  Fact Sheet for Patients:  BloggerCourse.com  Fact Sheet for Healthcare Providers:  SeriousBroker.it  This test is no t yet  approved or cleared by the [place] FDA and  has been authorized for detection and/or diagnosis of SARS-CoV-2 by FDA under an Emergency Use Authorization (EUA). This EUA will remain  in effect (meaning this test can be used) for the duration of the COVID-19 declaration under Section 564(b)(1) of the Act, 21 U.S.C.section 360bbb-3(b)(1), unless the authorization is terminated  or revoked sooner.       Influenza A by PCR NEGATIVE NEGATIVE Final   Influenza B by PCR NEGATIVE NEGATIVE Final    Comment: (NOTE) The Xpert Xpress SARS-CoV-2/FLU/RSV plus assay is intended as an aid in the diagnosis of influenza from Nasopharyngeal swab specimens and should not be used as a sole basis for treatment. Nasal washings and aspirates are unacceptable for Xpert Xpress SARS-CoV-2/FLU/RSV testing.  Fact Sheet for Patients: BloggerCourse.com  Fact Sheet for Healthcare Providers: SeriousBroker.it  This test is not yet approved or cleared by the [place] FDA and has been authorized for detection and/or diagnosis of SARS-CoV-2 by FDA under an Emergency Use Authorization (EUA). This EUA will remain in effect (meaning this test can be used) for the duration of the COVID-19 declaration under Section 564(b)(1) of the Act, 21 U.S.C. section 360bbb-3(b)(1), unless the authorization is terminated or revoked.     Resp Syncytial Virus by PCR NEGATIVE NEGATIVE Final    Comment: (NOTE) Fact Sheet for Patients: BloggerCourse.com  Fact Sheet for Healthcare Providers: SeriousBroker.it  This test is not yet approved or cleared by the [place] FDA and has been authorized for detection and/or diagnosis of SARS-CoV-2 by FDA under an Emergency Use Authorization (EUA). This EUA will remain in effect (meaning this test can be used) for the duration of the COVID-19 declaration under Section 564(b)(1) of  the Act, 21 U.S.C. section 360bbb-3(b)(1), unless the authorization is terminated or revoked.  Performed at [facility], [address], [place], [place] [address]      Labs: BNP (last 3 results) Recent Labs    12/14/23 1356 12/16/23 0645 12/17/23 0318  BNP 249.0* 175.0* 111.0*   Basic Metabolic Panel: Recent Labs  Lab 12/14/23 1356 12/14/23 1533 12/15/23 0439 12/16/23 0645 12/17/23 0318  NA 140  --  136 135 135  K 3.4*  --  3.7 3.6 3.9  CL 95*  --  98 92* 95*  CO2 31  --  31 30 31   GLUCOSE 113*  --  134* 178* 152*  BUN 21  --  17 24* 28*  CREATININE 1.40*  --  1.36* 1.53* 1.72*  CALCIUM 9.1  --  8.4* 9.1 9.1  MG  --  1.9  --  1.9 2.0   Liver Function Tests: Recent Labs  Lab 12/14/23 1356  AST 21  ALT 15  ALKPHOS 53  BILITOT 0.9  PROT 7.7  ALBUMIN 4.2   CBC: Recent Labs  Lab 12/14/23 1356 12/15/23 0439  WBC 11.5* 8.8  HGB 11.3* 10.2*  HCT 37.6* 34.1*  MCV 99.2 98.6  PLT 186 214   BNP: Recent Labs  Lab 12/14/23 1356 12/16/23 0645 12/17/23 0318  BNP 249.0* 175.0* 111.0*   CBG: Recent Labs  Lab 12/16/23 0741 12/16/23 1135 12/16/23 1609 12/16/23 2017 12/17/23 0743  GLUCAP 175* 228* 251* 313* 203*   Sepsis Labs Recent Labs  Lab 12/14/23 1356 12/15/23 0439  WBC 11.5* 8.8    Procedures/Studies: ECHOCARDIOGRAM COMPLETE Result Date: 12/15/2023    ECHOCARDIOGRAM REPORT   Patient Name:   Lucas Dudley Date of Exam: 12/15/2023 Medical Rec #:  [id number]     Height:       70.0 in Accession #:    [id number]    Weight:       251.1 lb Date of Birth:  01-Jul-1941      BSA:          2.299 m Patient Age:    82 years      BP:           155/75 mmHg Patient Gender: M             HR:           64 bpm. [facility] Procedure: 2D Echo, Cardiac Doppler, Color Doppler and Intracardiac            Opacification Agent (Both Spectral and Color Flow Doppler were            utilized during procedure). Indications:    CHF  History:        Patient has prior  history of Echocardiogram examinations, most                 recent 06/06/2022. Cardiomyopathy, CAD; Risk Factors:Sleep Apnea,                 Former Smoker, Hypertension and Diabetes.  Sonographer:    Dondra Prader RVT RCS Referring Phys: [id number] Heloise Bee[address]am [facility] IMPRESSIONS  1. Left ventricular ejection fraction, by estimation, is 55 to 60%. Left ventricular ejection fraction by PLAX is 59 %. The left ventricle has normal function. The left ventricle has no regional wall motion abnormalities. There is moderate concentric left ventricular hypertrophy. Left ventricular diastolic parameters are indeterminate.  2. Right ventricular systolic function is mildly reduced. The right ventricular size is normal. There is severely elevated pulmonary artery systolic pressure. The estimated right ventricular systolic pressure is 62.6 mmHg.  3. The mitral valve is grossly normal. No evidence of mitral valve regurgitation.  4. The aortic valve is tricuspid. Aortic valve regurgitation is not visualized. Aortic valve sclerosis/calcification is present, without any evidence of aortic stenosis.  5. The inferior vena cava is dilated in size with <50% respiratory variability, suggesting right atrial pressure of 15 mmHg. Comparison(s): Changes from prior study are noted. 06/06/2022: LVEF 60-65%, mild LVH, low normal RV function. FINDINGS  Left Ventricle: Left ventricular ejection fraction, by estimation, is 55 to 60%. Left ventricular ejection fraction by PLAX is 59 %. The left ventricle has normal function. The left ventricle has no regional wall motion abnormalities. Definity contrast agent was given IV to delineate the left ventricular endocardial borders. Strain imaging was not performed. The left ventricular internal cavity size was normal in size. There is moderate concentric left ventricular hypertrophy. Left ventricular diastolic parameters are indeterminate. Right Ventricle:  The right ventricular size is normal. No increase in  right ventricular wall thickness. Right ventricular systolic function is mildly reduced. There is severely elevated pulmonary artery systolic pressure. The tricuspid regurgitant velocity is 3.45 m/s, and with an assumed right atrial pressure of 15 mmHg, the estimated right ventricular systolic pressure is 62.6 mmHg. Left Atrium: Left atrial size was normal in size. Right Atrium: Right atrial size was normal in size. Pericardium: Trivial pericardial effusion is present. The pericardial effusion is posterior to the left ventricle. Mitral Valve: The mitral valve is grossly normal. No evidence of mitral valve regurgitation. Tricuspid Valve: The tricuspid valve is not well visualized. Tricuspid valve regurgitation is not demonstrated. Aortic Valve: The aortic valve is tricuspid. Aortic valve regurgitation is not visualized. Aortic valve sclerosis/calcification is present, without any evidence of aortic stenosis. Aortic valve mean gradient measures 4.0 mmHg. Aortic valve peak gradient measures 6.8 mmHg. Aortic valve area, by VTI measures 1.59 cm. Pulmonic Valve: The pulmonic valve was not well visualized. Pulmonic valve regurgitation is not visualized. Aorta: The aortic root and ascending aorta are structurally normal, with no evidence of dilitation. Venous: The inferior vena cava is dilated in size with less than 50% respiratory variability, suggesting right atrial pressure of 15 mmHg. IAS/Shunts: No atrial level shunt detected by color flow Doppler. Additional Comments: 3D imaging was not performed.  LEFT VENTRICLE PLAX 2D LV EF:         Left            Diastology                ventricular     LV e' medial:    5.81 cm/s                ejection        LV E/e' medial:  13.9                fraction by     LV e' lateral:   8.81 cm/s                PLAX is 59      LV E/e' lateral: 9.1                %. LVIDd:         5.55 cm LVIDs:         3.80 cm LV PW:         1.65 cm LV IVS:        1.75 cm LVOT diam:     2.20 cm LV SV:          46 LV SV Index:   20 LVOT Area:     3.80 cm  RIGHT VENTRICLE            IVC RV Basal diam:  4.40 cm    IVC diam: 2.40 cm RV S prime:     8.93 cm/s TAPSE (M-mode): 1.8 cm LEFT ATRIUM             Index        RIGHT ATRIUM           Index LA diam:        4.30 cm 1.87 cm/m   RA Area:     20.00 cm LA Vol (A2C):   73.8 ml 32.10 ml/m  RA Volume:   58.90 ml  25.62 ml/m LA Vol (A4C):   54.1 ml 23.53 ml/m LA Biplane Vol: 64.3 ml 27.97 ml/m  AORTIC VALVE                    PULMONIC VALVE AV Area (Vmax):    1.83 cm     PV Vmax:       0.51 m/s AV Area (Vmean):   1.71 cm     PV Peak grad:  1.1 mmHg AV Area (VTI):     1.59 cm AV Vmax:           130.00 cm/s AV Vmean:          86.900 cm/s AV VTI:            0.289 m AV Peak Grad:      6.8 mmHg AV Mean Grad:      4.0 mmHg LVOT Vmax:         62.60 cm/s LVOT Vmean:        39.000 cm/s LVOT VTI:          0.121 m LVOT/AV VTI ratio: 0.42  AORTA Ao Root diam: 3.00 cm Ao Asc diam:  3.60 cm MITRAL VALVE               TRICUSPID VALVE MV Area (PHT): 4.41 cm    TR Peak grad:   47.6 mmHg MV Decel Time: 172 msec    TR Vmax:        345.00 cm/s MV E velocity: 80.60 cm/s MV A velocity: 16.30 cm/s  SHUNTS MV E/A ratio:  4.94        Systemic VTI:  0.12 m                            Systemic Diam: 2.20 cm Zoila Shutter MD Electronically signed by Zoila Shutter MD Signature Date/Time: 12/15/2023/11:41:54 AM    Final    CT Angio Chest PE W/Cm &/Or Wo Cm Result Date: 12/14/2023 CLINICAL DATA:  Generalized chest pain worse with deep inspiration EXAM: CT ANGIOGRAPHY CHEST WITH CONTRAST TECHNIQUE: Multidetector CT imaging of the chest was performed using the standard protocol during bolus administration of intravenous contrast. Multiplanar CT image reconstructions and MIPs were obtained to evaluate the vascular anatomy. RADIATION DOSE REDUCTION: This exam was performed according to the departmental dose-optimization program which includes automated exposure control, adjustment of the mA and/or  kV according to patient size and/or use of iterative reconstruction technique. CONTRAST:  75mL OMNIPAQUE IOHEXOL 350 MG/ML SOLN COMPARISON:  Chest x-ray 12/14/2023, cardiac CT 08/29/2022 FINDINGS: Cardiovascular: Satisfactory opacification of the pulmonary arteries to the segmental level. No evidence of pulmonary embolism. Mild to moderate aortic atherosclerosis. No aneurysm. Watch min device in the left atrial appendage. Coronary vascular calcification. Cardiomegaly. Trace pericardial effusion Mediastinum/Nodes: Patent trachea. No thyroid mass. Mildly prominent AP window node measuring 12 mm. Small hilar nodes, on the right measuring to 13 mm. Right paratracheal node measuring 13 mm. Subcarinal lymph node measures 25 mm on series 4, image 50. Esophagus within normal limits. Lungs/Pleura: Small pleural effusions. No focal consolidation or pneumothorax. Mild mosaic density potentially due to small airways or small vessel disease. Scattered punctate pulmonary nodules all measuring less than 5 mm, for example 3 mm superior right lower lobe pulmonary nodule on series 6, image 55. Mild subpleural reticulation. Upper Abdomen: No acute finding Musculoskeletal: No acute osseous abnormality Review of the MIP images confirms the above findings. IMPRESSION: 1. Negative for acute pulmonary embolus 2. Cardiomegaly with small pleural effusions. 3. Nonspecific mild mediastinal and right hilar  adenopathy 4. Scattered punctate pulmonary nodules measuring less than 5 mm. No follow-up needed if patient is low-risk (and has no known or suspected primary neoplasm). Non-contrast chest CT can be considered in 12 months if patient is high-risk. This recommendation follows the consensus statement: Guidelines for Management of Incidental Pulmonary Nodules Detected on CT Images: From the Fleischner Society 2017; Radiology 2017; 284:228-243. Aortic Atherosclerosis (ICD10-I70.0). Electronically Signed   By: Jasmine Pang M.D.   On: 12/14/2023  16:17   DG Chest Portable 1 View Result Date: 12/14/2023 CLINICAL DATA:  Chest pain. EXAM: PORTABLE CHEST 1 VIEW COMPARISON:  11/08/2023 FINDINGS: Technically limited due to habitus and AP technique. Chronic cardiomegaly, grossly unchanged allowing for differences in technique. Chronic elevation of right hemidiaphragm. Skin fold projects over the left hemithorax. Mild interstitial coarsening without focal airspace disease. No pneumothorax or large pleural effusion. Chronic right shoulder arthropathy. IMPRESSION: Stable cardiomegaly.  No acute radiographic findings. Electronically Signed   By: Narda Rutherford M.D.   On: 12/14/2023 13:59   Intravitreal Injection, Pharmacologic Agent - OD - Right Eye Result Date: 11/30/2023 Time Out 11/29/2023. 2:43 PM. Confirmed correct patient, procedure, site, and patient consented. Anesthesia Topical anesthesia was used. Anesthetic medications included Lidocaine 2%, Proparacaine 0.5%. Procedure Preparation included 5% betadine to ocular surface, eyelid speculum. A (32g) needle was used. Injection: 6 mg faricimab-svoa 6 MG/0.05ML (Patient supplied)   Route: Intravitreal, Site: Right Eye   NDC: 60109-323-55, Lot: [id number], Expiration date: 04/13/2025, Waste: 0 mL Post-op Post injection exam found visual acuity of at least counting fingers. The patient tolerated the procedure well. There were no complications. The patient received written and verbal post procedure care education. Notes **SAMPLE MEDICATION ADMINISTERED**   Intravitreal Injection, Pharmacologic Agent - OS - Left Eye Result Date: 11/30/2023 Time Out 11/29/2023. 2:43 PM. Confirmed correct patient, procedure, site, and patient consented. Anesthesia Topical anesthesia was used. Anesthetic medications included Lidocaine 2%, Proparacaine 0.5%. Procedure Preparation included 5% betadine to ocular surface, eyelid speculum. A (32g) needle was used. Injection: 6 mg faricimab-svoa 6 MG/0.05ML (Patient supplied)   Route:  Intravitreal, Site: Left Eye   NDC: 27062-376-28, Lot: [id number], Expiration date: 06/14/2025, Waste: 0 mL Post-op Post injection exam found visual acuity of at least counting fingers. The patient tolerated the procedure well. There were no complications. The patient received written and verbal post procedure care education. Post injection medications were not given. Notes **SAMPLE MEDICATION ADMINISTERED**  OCT, Retina - OU - Both Eyes Result Date: 11/30/2023 Right Eye Quality was good. Central Foveal Thickness: 291. Progression has improved. Findings include no SRF, abnormal foveal contour, intraretinal fluid, vitreomacular adhesion (persistent IRF/IRHM -- slightly improved, partial PVD). Left Eye Quality was good. Central Foveal Thickness: 288. Progression has been stable. Findings include normal foveal contour, no IRF, no SRF, intraretinal hyper-reflective material (Trace persistent cystic changes and punctate IRHM inferior fovea; partial PVD). Notes *Images captured and stored on drive Diagnosis / Impression: +DME OU OD: persistent IRF/IRHM -- slightly improved, partial PVD OS: Trace persistent cystic changes and punctate IRHM inferior fovea; partial PVD Clinical management: See below Abbreviations: NFP - Normal foveal profile. CME - cystoid macular edema. PED - pigment epithelial detachment. IRF - intraretinal fluid. SRF - subretinal fluid. EZ - ellipsoid zone. ERM - epiretinal membrane. ORA - outer retinal atrophy. ORT - outer retinal tubulation. SRHM - subretinal hyper-reflective material. IRHM - intraretinal hyper-reflective material   Time coordinating discharge: 50 mins  SIGNED:  Carollee Herter, DO [facility] 12/17/23, 10:08 AM

## 2023-12-17 NOTE — Progress Notes (Signed)
 Heart Failure Nurse Navigator Progress Note  PCP: Anabel Halon, MD PCP-Cardiologist: Nona Dell Admission Diagnosis: Acute on chronic combined systolic and diastolic CHF (congestive heart failure) (HCC) Acute Respiratory Failure with hypoxia (HCC) Elevated Troponin Type 2 Diabetes mellitus with other specified complication, with long term current use of insulin [facility]) Admitted from: Home  Presentation:   Lucas Dudley presented with generalized chest pain. He has been experiencing ongoing shortness of breath for the past few weeks that has progressively gotten worse.  Pain worse with deep inspiration.  he also admits to increased edema in his lower extremities. Daughter noted that he has been experiencing ongoing complications with anemia. CXR-Cardiomegaly with small pleural effusions, nonspecific mild mediastinal and right hilar adenopathy, scattered punctate pulmonary nodules measuring less that 5mm. BNP 111.0.  Heart Failure Navigator completed HF Interview and Education via telephone into patient room 322 @ [facility].  Confirmed patient identity with DOB and name prior to start of interview.  RN at [facility] provide patient with "Living Better with Heart Failure" folder for discharge.  Patient understood all HF information provided.  ECHO/ LVEF: 55-60%  Clinical Course:  Past Medical History:  Diagnosis Date   Arthritis    Atrial fibrillation (HCC)    CAD (coronary artery disease)    a. Cath 03/17/15 showing 100% ostial D1, 50% prox LAD to mid LAD, 40% RPDA stenosis. Med rx. // Myoview 01/2020: EF 31 diffuse perfusion defect without reversibility (suspect artifact); reviewed with Dr. Tama High study felt to be low risk   Cataract    Mixed form OD   Chronic diastolic CHF (congestive heart failure) (HCC)    Chronically elevated hemidiaphragm - Right Side    Diabetic retinopathy (HCC)    NPDR OU   Essential hypertension    Glaucoma    POAG OU   History of gout     Hyperlipidemia    Hypertensive retinopathy    OU   Kidney stones    Melanoma of neck (HCC)    NICM (nonischemic cardiomyopathy) (HCC)    OSA on CPAP 2012   Prostate cancer (HCC)    Type II diabetes mellitus (HCC)      Social History   Socioeconomic History   Marital status: Widowed    Spouse name: Not on file   Number of children: 1   Years of education: Not on file   Highest education level: Not on file  Occupational History   Occupation: Retired  Tobacco Use   Smoking status: Former    Current packs/day: 0.00    Average packs/day: 2.0 packs/day for 27.0 years (54.0 ttl pk-yrs)    Types: Cigarettes    Start date: 34    Quit date: 1992    Years since quitting: 33.1    Passive exposure: Current   Smokeless tobacco: Never   Tobacco comments:    Former smoker 09/27/21  Vaping Use   Vaping status: Never Used  Substance and Sexual Activity   Alcohol use: No    Alcohol/week: 0.0 standard drinks of alcohol    Comment: "used to drink; stopped ~ 2008"   Drug use: No   Sexual activity: Not Currently  Other Topics Concern   Not on file  Social History Narrative   Lives in [place], [place] with wife.   Social Drivers of Corporate investment banker Strain: Low Risk  (11/20/2023)   Overall Financial Resource Strain (CARDIA)    Difficulty of Paying Living Expenses: Not hard at all  Food Insecurity: No Food Insecurity (12/14/2023)   Hunger Vital Sign    Worried About Running Out of Food in the Last Year: Never true    Ran Out of Food in the Last Year: Never true  Transportation Needs: No Transportation Needs (12/14/2023)   PRAPARE - Administrator, Civil Service (Medical): No    Lack of Transportation (Non-Medical): No  Physical Activity: Inactive (11/20/2023)   Exercise Vital Sign    Days of Exercise per Week: 0 days    Minutes of Exercise per Session: 0 min  Stress: No Stress Concern Present (11/20/2023)   [employer] of Occupational Health - Occupational Stress  Questionnaire    Feeling of Stress : Not at all  Social Connections: Moderately Integrated (12/14/2023)   Social Connection and Isolation Panel [NHANES]    Frequency of Communication with Friends and Family: More than three times a week    Frequency of Social Gatherings with Friends and Family: More than three times a week    Attends Religious Services: More than 4 times per year    Active Member of [employer] or Organizations: Yes    Attends Banker Meetings: More than 4 times per year    Marital Status: Widowed  Recent Concern: Social Connections - Moderately Isolated (11/20/2023)   Social Connection and Isolation Panel [NHANES]    Frequency of Communication with Friends and Family: More than three times a week    Frequency of Social Gatherings with Friends and Family: Once a week    Attends Religious Services: More than 4 times per year    Active Member of [employer] or Organizations: No    Attends Banker Meetings: Never    Marital Status: Widowed   Education Assessment and Provision:  Detailed education and instructions provided on heart failure disease management including the following:  Signs and symptoms of Heart Failure When to call the physician Importance of daily weights Low sodium diet Fluid restriction Medication management Anticipated future follow-up appointments  Patient education given on each of the above topics.  Patient acknowledges understanding via teach back method and acceptance of all instructions.  Education Materials:  "Living Better With Heart Failure" Booklet, HF zone tool, & Daily Weight Tracker Tool.  Patient has scale at home: Yes- he has not been doing daily weights.  Educated and encouraged to start doing daily weights. Patient has pill box at home: No.  Patient does not currently use a pill box for organization but he does take his daily medications.  High Risk Criteria for Readmission and/or Poor Patient Outcomes: Heart failure  hospital admissions (last 6 months): 1  No Show rate: 2 Difficult social situation: None Demonstrates medication adherence: Yes Primary Language: English Literacy level: Reading, Writing & Comprehension  Barriers of Care:   None  Considerations/Referrals:   Referral made to Heart Failure Pharmacist Stewardship: Yes Referral made to Heart Failure CSW/NCM TOC: No Referral made to Heart & Vascular TOC clinic: No. Patient prefers to go to Dr. Diona Browner for his post hospital follow-up appointment.  Message sent to scheduling for this to be scheduled.  Items for Follow-up on DC/TOC: Diet & Fluid Restrictions Daily weights Medication Compliance with HF medication adjustments Continued Heart Failure Education   Roxy Horseman, RN, BSN [facility] Heart Failure Navigator Secure Chat Only

## 2023-12-17 NOTE — TOC Transition Note (Signed)
 Transition of Care [facility]) - Discharge Note   Patient Details  Name: EMMAUS BRANDI MRN: [id number] Date of Birth: 1941/01/28  [facility] CM/SW Contact:  Beather Arbour Phone Number: 12/17/2023, 10:25 AM   Clinical Narrative:    Patient is discharging today . No TOC needs identified . CHF information was added to patient AVS and patient was made aware after assessing. Patient shared that he will be trying a salt free diet, weighs hisself daily, followed by a cardiologist, and take all medication prescribes. CSW did offer a RN to come out and pt declined. TOC signing off.    Final next level of care: Home/Self Care Barriers to Discharge: Barriers Resolved   Patient Goals and CMS Choice Patient states their goals for this hospitalization and ongoing recovery are:: return back home CMS Medicare.gov Compare Post Acute Care list provided to:: Patient Choice offered to / list presented to : Patient      Discharge Placement                Patient to be transferred to facility by: Pt is DC home Name of family member notified: Jonny Ruiz- Patient Patient and family notified of of transfer: 12/17/23  Discharge Plan and Services Additional resources added to the After Visit Summary for   In-house Referral: Clinical Social Work   Post Acute Care Choice: Durable Medical Equipment          DME Arranged: N/A DME Agency: NA       HH Arranged: Refused HH HH Agency: NA        Social Drivers of Health (SDOH) Interventions SDOH Screenings   Food Insecurity: No Food Insecurity (12/14/2023)  Housing: Low Risk  (12/14/2023)  Transportation Needs: No Transportation Needs (12/14/2023)  Utilities: Not At Risk (12/14/2023)  Alcohol Screen: Low Risk  (11/20/2023)  Depression (PHQ2-9): Low Risk  (11/20/2023)  Financial Resource Strain: Low Risk  (11/20/2023)  Physical Activity: Inactive (11/20/2023)  Social Connections: Moderately Integrated (12/14/2023)  Recent Concern: Social Connections  - Moderately Isolated (11/20/2023)  Stress: No Stress Concern Present (11/20/2023)  Tobacco Use: Medium Risk (12/14/2023)  Health Literacy: Adequate Health Literacy (11/20/2023)     Readmission Risk Interventions    12/17/2023   10:19 AM 12/16/2023   12:34 PM 12/15/2023    5:49 PM  Readmission Risk Prevention Plan  Transportation Screening Complete Complete Complete  PCP or Specialist Appt within 3-5 Days   Complete  Home Care Screening Complete Complete   Medication Review (RN CM) Complete Complete   Social Work Consult for Recovery Care Planning/Counseling   Complete  Palliative Care Screening   Complete  Medication Review Oceanographer)   Complete

## 2023-12-17 NOTE — Progress Notes (Addendum)
 PROGRESS NOTE    BUELL PARCEL  QIH:[id number] DOB: 1941/02/13 DOA: 12/14/2023 PCP: Anabel Halon, MD  Subjective: Pt seen and examined.   Had another 1.6 liters in urine yesterday. Net negative 3.84 liters since admission. Weight today 109 kg. 240.3 lbs. This will be his dry weight. Dry weight BNP of 111  Ready to go home Remains off O2. Slight increase in Scr to 1.7. pt will hold diuretics today and restart demadex tomorrow.  Pt admits he bought a country ham and was eating country ham for 2-3 days prior to admission. Likely dietary indiscretion and salt loading as the cause for his CHF exacerbation.  Ready to go home.   Hospital Course: HPI: KWALI WRINKLE is a 83 y.o. male with medical history significant for systolic and diastolic CHF, hypertension, AVM, CAD, diabetes mellitus, OSA, paroxysmal atrial fibrillation. Patient presented to ED with complaints of chest pain, and bilateral lower extremity swelling with difficulty breathing.  Chest pain started this morning, constant, mid chest, nonradiating.  He reports onset of difficulty breathing mostly with exertion, and increasing bilateral lower extremity swelling , with intermittent abdominal bloating, that started several weeks ago.  Notes increase weight of at least 20 pounds from baseline of about 240s, to 260s now.  Saw his cardiologist 2/19, his dose of torsemide was increased from 40 to 60 mg, but for the past couple of days he has been taking up to 80 mg without improvement in swelling.   ED Course: O2 sats down to 86% on room air, on 3 L sats 91%.  Respiratory rate 16-20.  Heart rate 49- 59.  BNP 249.  Troponin 23 > 24. Chest X-ray without acute abnormality.  CTA chest negative for PE, small pleural effusions. Lasix 40 mg x 1 given.  Nitro paste applied. Hospitalist admit for CHF.  Significant Events: Admitted 12/14/2023 for acute respiratory failure with hypoxia, acute on chronic combined CHF   Significant Labs: WBC  11.5, HgB 11.3, Plt 186 Na 140, K 3.3, CO2 of 31, BUN 21, Scr 1.4 BNP 249  Significant Imaging Studies: CXR Stable cardiomegaly. No acute radiographic findings.  CTPA Negative for acute pulmonary embolus 2. Cardiomegaly with small pleural effusions. 3. Nonspecific mild mediastinal and right hilar adenopathy 4. Scattered punctate pulmonary nodules measuring less than 5 mm.   Antibiotic Therapy: Anti-infectives (From admission, onward)    None       Procedures:   Consultants:     Assessment and Plan: * Acute hypoxic respiratory failure (HCC) On admission. O2 sats down to 85% on room air, currently on 3 L sats 91%.  Likely secondary to decompensated CHF, with CTA negative for PE, showing bilateral pleural effusions.  Clear lung exam.  COVID influenza RSV negative.  Quit smoking cigarettes almost 30 years ago.  12-15-2023 on my review of CTPA. Pt with minimal pleural effusions. Effusions not the cause of his hypoxia. 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  12-16-2023 weaned to RA.  Acute on chronic combined systolic and diastolic CHF (congestive heart failure) (HCC) - dry Weight 109 kg/240.3 lbs. dry weight BNP of 111 On admission. Presenting with dyspnea on exertion, at least 2+ pitting bilateral lower extremity edema hypoxia.  BNP 249.  Per chart- weight gain over the past 6 weeks, per daughter up to 20 pound weight gain.  CT chest with cardiomegaly and small pleural effusions.  Has been compliant with torsemide 40 mg recently increased to 60 mg daily.  Last echo 05/2022  EF of 60 to 65%. -Good response in ED to 40 mg of Lasix, continue 40 twice daily -Obtain updated echocardiogram -Strict input output, daily weight, daily BMP -Reports midsternal chest pain, troponin 23 > 24, EKG unchanged.  12-15-2023 Pt states he weighs about 20 lbs more than baseline.  Not sure if that is true. PCP office note from 11-11-2023 documents his weight at 116.6 kg. Cardiology office note from  12-04-2023 showed weight at 119.2 kg. Admission weight was 113.9 kg. Pt's documented weights are not correlating with his statements. Will change diuretics to Bumex 2 mg IV 12h. Add small dose of metolazone and diamox. He only has +1 pitting edema. R LE > Left LE. Repeat BMP and BNP in AM.  12-16-2023 unclear how accurate pt's weights are. Urine output was 2.4 liters yesterday but weight his down 3 kg.  Scr rising to 1.53(was 1.36 yesterday). BNP improved to 175.  Will reduced lasix to 80 mg IV today.  Reassess tomorrow. Hold on further metolazone, diamox.  Repeat BMP in AM.  Weight Information (since admission)     Date/Time Weight Weight in lbs BSA (Calculated - sq m) BMI (Calculated) Who   12/16/23 0437 110.8 kg 244.2 lbs -- 35.04 ST   12/15/23 0500 113.9 kg 251.1 lbs -- 36.03 XM   12/14/23 1235 117.1 kg 258.16 lbs 2.4 sq meters 37.04 BB   12-17-2023 Had another 1.6 liters in urine yesterday. Net negative 3.84 liters since admission. Weight today 109 kg. 240.3 lbs. This will be his dry weight. Dry weight BNP of 111   Ready to go home Remains off O2. Slight increase in Scr to 1.7. pt will hold diuretics today and restart demadex tomorrow. Pt admits he bought a country ham and was eating country ham for 2-3 days prior to admission. Likely dietary indiscretion and salt loading as the cause for his CHF exacerbation. Discharge BUN/Scr 28/1.72. will hold ACEI at discharge until seen in cardiology f/u clinic.  Hold diuretics today. Restart demadex tomorrow. 40 mg in the morning and 20 mg in the afternoon   Weight Information (since admission)     Date/Time Weight Weight in lbs BSA (Calculated - sq m) BMI (Calculated) Who   12/17/23 0709 109 kg 240.3 lbs -- 34.48 TH   12/16/23 0437 110.8 kg 244.2 lbs -- 35.04 ST   12/15/23 0500 113.9 kg 251.1 lbs -- 36.03 XM   12/14/23 1235 117.1 kg 258.16 lbs 2.4 sq meters 37.04 BB    Pulmonary nodules On admission. Incidental finding on CTA chest today 3/1-  scattered punctate pulmonary nodules measuring less than 5 mm. No follow-up needed if patient is low-risk (and has no known or suspected primary neoplasm). Non-contrast chest CT can be considered in 12 months if patient is high-risk. -Reports his quit smoking cigarettes about 30 years ago. -Follow-up with outpatient provider.  12-15-2023 stable.  12-16-2023 stable.  Hypokalemia On admission. Potassium 3.4.  Replete with oral KCL. -Check magnesium  12-15-2023 K 3.7 today. Add kcl replacement during aggressive diuresis.  12-16-2023 K of 3.6 today.  12-17-2023 Discharge K of 3.9  Essential hypertension On admission. Stable. -Hold lisinopril 40 mg daily with contrast exposure -Monitor with diuresis  12-15-2023 monitor BP during diuresis. Coreg was stopped by cardiology during 12-04-2023 office visit due concern for possible symptomatic bradycardia. Add hydralazine for elevated BP.  12-16-2023 BP improved with tid hydralazine. Not on coreg by outpatient cardiology due to concern for possible undiagnosed bradycardia.  12-17-2023 hold ACEIat discharge  due to CKD 3a. Continue with hydralazine 10 mg tid. F/u with cardiology in 2 weeks to either continue hydralazine or restart ACEI.  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 Had Watchman device placed 06-28-2022   12-16-2023 stable.  AVM (arteriovenous malformation) of small bowel, acquired On admission. Hemoglobin stable at 11.3 g/dl. -Resume aspirin  12-15-2023 hold oral iron for now as not to confuse any black stools with GI bleeding.  12-16-2023 stable. Continue to hold po iron.  OSA on CPAP 12-15-2023 pt may use hospital CPAP machine.  12-16-2023 stable.  Type 2 diabetes mellitus with other specified complication (HCC) On admission. 11/11/23- A1c 8.3.. -Resume home Lantus at reduced dose 10 units daily, (home dose 38 units nightly). - SSI- M  12-15-2023 monitor  CBGs.  12-16-2023 stable. CBG acceptable ranges.  Paroxysmal atrial fibrillation (HCC) On admission. Currently in sinus rhythm.  On aspirin.Watchman device.  Follows with Dr. Diona Browner.  Not on anticoagulation. Hx of multiple AVMs. -Resume amiodarone. -Resume aspirin.  12-15-2023 stable.  12-16-2023 stable.  CKD stage 3a, GFR 45-59 ml/min (HCC) - baseline Scr 1.2-1.4 12-17-2023 Discharge BUN/Scr 28/1.72. will hold ACEI at discharge until seen in cardiology f/u clinic.  Hold diuretics today. Restart demadex tomorrow. 40 mg in the morning and 20 mg in the afternoon   Chronic combined systolic and diastolic CHF (congestive heart failure) (HCC) - dry Weight 109 kg/240.3 lbs. dry weight BNP of 111 12-17-2023 coreg stopped by cards due to concern for undiagnosed bradycardia. Will need to hold ARB due to rise in Scr due to diuresis. DC to home with hydralazine 10 mg tid. Restart demadex tomorrow 40 qam 20 mg qpm. F/u with cards in office in 2-4 weeks to consider restarting ACEI(after repeat BMP) vs continuing with hydralazine.  Obesity, Class II, BMI 35-39.9 Estimated body mass index is 35.04 kg/m as calculated from the following:   Height as of this encounter: 5\' 10"  (1.778 m).   Weight as of this encounter: 110.8 kg.   DVT prophylaxis: Place TED hose Start: 12/15/23 0956  Lovenox   Code Status: Full Code Family Communication: no family at bedside. Pt is decisional. Disposition Plan: return home Reason for continuing need for hospitalization: stable for DC  Objective: Vitals:   12/16/23 2012 12/17/23 0517 12/17/23 0518 12/17/23 0709  BP: 138/63 (!) 120/48 (!) 120/48   Pulse: 68  64   Resp: 20     Temp: 99 F (37.2 C)  98.2 F (36.8 C)   TempSrc: Oral  Oral   SpO2: 90%  96%   Weight:    109 kg  Height:        Intake/Output Summary (Last 24 hours) at 12/17/2023 0944 Last data filed at 12/17/2023 0820 Gross per 24 hour  Intake 780 ml  Output 2050 ml  Net -1270 ml   Filed  Weights   12/15/23 0500 12/16/23 0437 12/17/23 0709  Weight: 113.9 kg 110.8 kg 109 kg    Examination:  Physical Exam Vitals and nursing note reviewed.  Constitutional:      General: He is not in acute distress.    Appearance: He is obese. He is not toxic-appearing or diaphoretic.  HENT:     Head: Normocephalic and atraumatic.     Nose: Nose normal.  Eyes:     General: No scleral icterus. Cardiovascular:     Rate and Rhythm: Normal rate and regular rhythm.     Pulses: Normal pulses.  Pulmonary:  Effort: Pulmonary effort is normal. No respiratory distress.     Breath sounds: Normal breath sounds. No wheezing or rales.  Abdominal:     General: Abdomen is protuberant. Bowel sounds are normal. There is no distension.     Palpations: Abdomen is soft.     Tenderness: There is no abdominal tenderness. There is no guarding or rebound.  Musculoskeletal:     Comments: Calves are soft. Only trace pretibial pitting edema bilaterally Chronic +1 bilateral dorsal pedal edema. Unchanged.  Skin:    General: Skin is warm and dry.     Capillary Refill: Capillary refill takes less than 2 seconds.  Neurological:     Mental Status: He is alert and oriented to person, place, and time.     Data Reviewed: I have personally reviewed following labs and imaging studies  CBC: Recent Labs  Lab 12/14/23 1356 12/15/23 0439  WBC 11.5* 8.8  HGB 11.3* 10.2*  HCT 37.6* 34.1*  MCV 99.2 98.6  PLT 186 214   Basic Metabolic Panel: Recent Labs  Lab 12/14/23 1356 12/14/23 1533 12/15/23 0439 12/16/23 0645 12/17/23 0318  NA 140  --  136 135 135  K 3.4*  --  3.7 3.6 3.9  CL 95*  --  98 92* 95*  CO2 31  --  31 30 31   GLUCOSE 113*  --  134* 178* 152*  BUN 21  --  17 24* 28*  CREATININE 1.40*  --  1.36* 1.53* 1.72*  CALCIUM 9.1  --  8.4* 9.1 9.1  MG  --  1.9  --  1.9 2.0   GFR: Estimated Creatinine Clearance: 42.3 mL/min (A) (by C-G formula based on SCr of 1.72 mg/dL (H)). Liver Function  Tests: Recent Labs  Lab 12/14/23 1356  AST 21  ALT 15  ALKPHOS 53  BILITOT 0.9  PROT 7.7  ALBUMIN 4.2   BNP (last 3 results) Recent Labs    12/14/23 1356 12/16/23 0645 12/17/23 0318  BNP 249.0* 175.0* 111.0*   CBG: Recent Labs  Lab 12/16/23 0741 12/16/23 1135 12/16/23 1609 12/16/23 2017 12/17/23 0743  GLUCAP 175* 228* 251* 313* 203*   Recent Results (from the past 240 hours)  Resp panel by RT-PCR (RSV, Flu A&B, Covid) Anterior Nasal Swab     Status: None   Collection Time: 12/14/23  2:52 PM   Specimen: Anterior Nasal Swab  Result Value Ref Range Status   SARS Coronavirus 2 by RT PCR NEGATIVE NEGATIVE Final    Comment: (NOTE) SARS-CoV-2 target nucleic acids are NOT DETECTED.  The SARS-CoV-2 RNA is generally detectable in upper respiratory specimens during the acute phase of infection. The lowest concentration of SARS-CoV-2 viral copies this assay can detect is 138 copies/mL. A negative result does not preclude SARS-Cov-2 infection and should not be used as the sole basis for treatment or other patient management decisions. A negative result may occur with  improper specimen collection/handling, submission of specimen other than nasopharyngeal swab, presence of viral mutation(s) within the areas targeted by this assay, and inadequate number of viral copies(<138 copies/mL). A negative result must be combined with clinical observations, patient history, and epidemiological information. The expected result is Negative.  Fact Sheet for Patients:  BloggerCourse.com  Fact Sheet for Healthcare Providers:  SeriousBroker.it  This test is no t yet approved or cleared by the [place] FDA and  has been authorized for detection and/or diagnosis of SARS-CoV-2 by FDA under an Emergency Use Authorization (EUA). This EUA will remain  in effect (meaning this test can be used) for the duration of the COVID-19 declaration  under Section 564(b)(1) of the Act, 21 U.S.C.section 360bbb-3(b)(1), unless the authorization is terminated  or revoked sooner.       Influenza A by PCR NEGATIVE NEGATIVE Final   Influenza B by PCR NEGATIVE NEGATIVE Final    Comment: (NOTE) The Xpert Xpress SARS-CoV-2/FLU/RSV plus assay is intended as an aid in the diagnosis of influenza from Nasopharyngeal swab specimens and should not be used as a sole basis for treatment. Nasal washings and aspirates are unacceptable for Xpert Xpress SARS-CoV-2/FLU/RSV testing.  Fact Sheet for Patients: BloggerCourse.com  Fact Sheet for Healthcare Providers: SeriousBroker.it  This test is not yet approved or cleared by the [place] FDA and has been authorized for detection and/or diagnosis of SARS-CoV-2 by FDA under an Emergency Use Authorization (EUA). This EUA will remain in effect (meaning this test can be used) for the duration of the COVID-19 declaration under Section 564(b)(1) of the Act, 21 U.S.C. section 360bbb-3(b)(1), unless the authorization is terminated or revoked.     Resp Syncytial Virus by PCR NEGATIVE NEGATIVE Final    Comment: (NOTE) Fact Sheet for Patients: BloggerCourse.com  Fact Sheet for Healthcare Providers: SeriousBroker.it  This test is not yet approved or cleared by the [place] FDA and has been authorized for detection and/or diagnosis of SARS-CoV-2 by FDA under an Emergency Use Authorization (EUA). This EUA will remain in effect (meaning this test can be used) for the duration of the COVID-19 declaration under Section 564(b)(1) of the Act, 21 U.S.C. section 360bbb-3(b)(1), unless the authorization is terminated or revoked.  Performed at [facility], [address] [place], [place] [address]     Radiology Studies: ECHOCARDIOGRAM COMPLETE Result Date: 12/15/2023    ECHOCARDIOGRAM REPORT   Patient  Name:   BRIGIDO MERA Date of Exam: 12/15/2023 Medical Rec #:  [id number]     Height:       70.0 in Accession #:    [id number]    Weight:       251.1 lb Date of Birth:  1941/03/17      BSA:          2.299 m Patient Age:    82 years      BP:           155/75 mmHg Patient Gender: M             HR:           64 bpm. [facility] Procedure: 2D Echo, Cardiac Doppler, Color Doppler and Intracardiac            Opacification Agent (Both Spectral and Color Flow Doppler were            utilized during procedure). Indications:    CHF  History:        Patient has prior history of Echocardiogram examinations, most                 recent 06/06/2022. Cardiomyopathy, CAD; Risk Factors:Sleep Apnea,                 Former Smoker, Hypertension and Diabetes.  Sonographer:    Dondra Prader RVT RCS Referring Phys: [id number] [facility] IMPRESSIONS  1. Left ventricular ejection fraction, by estimation, is 55 to 60%. Left ventricular ejection fraction by PLAX is 59 %. The left ventricle has normal function. The left ventricle has no regional wall motion abnormalities.  There is moderate concentric left ventricular hypertrophy. Left ventricular diastolic parameters are indeterminate.  2. Right ventricular systolic function is mildly reduced. The right ventricular size is normal. There is severely elevated pulmonary artery systolic pressure. The estimated right ventricular systolic pressure is 62.6 mmHg.  3. The mitral valve is grossly normal. No evidence of mitral valve regurgitation.  4. The aortic valve is tricuspid. Aortic valve regurgitation is not visualized. Aortic valve sclerosis/calcification is present, without any evidence of aortic stenosis.  5. The inferior vena cava is dilated in size with <50% respiratory variability, suggesting right atrial pressure of 15 mmHg. Comparison(s): Changes from prior study are noted. 06/06/2022: LVEF 60-65%, mild LVH, low normal RV function. FINDINGS  Left Ventricle: Left ventricular  ejection fraction, by estimation, is 55 to 60%. Left ventricular ejection fraction by PLAX is 59 %. The left ventricle has normal function. The left ventricle has no regional wall motion abnormalities. Definity contrast agent was given IV to delineate the left ventricular endocardial borders. Strain imaging was not performed. The left ventricular internal cavity size was normal in size. There is moderate concentric left ventricular hypertrophy. Left ventricular diastolic parameters are indeterminate. Right Ventricle: The right ventricular size is normal. No increase in right ventricular wall thickness. Right ventricular systolic function is mildly reduced. There is severely elevated pulmonary artery systolic pressure. The tricuspid regurgitant velocity is 3.45 m/s, and with an assumed right atrial pressure of 15 mmHg, the estimated right ventricular systolic pressure is 62.6 mmHg. Left Atrium: Left atrial size was normal in size. Right Atrium: Right atrial size was normal in size. Pericardium: Trivial pericardial effusion is present. The pericardial effusion is posterior to the left ventricle. Mitral Valve: The mitral valve is grossly normal. No evidence of mitral valve regurgitation. Tricuspid Valve: The tricuspid valve is not well visualized. Tricuspid valve regurgitation is not demonstrated. Aortic Valve: The aortic valve is tricuspid. Aortic valve regurgitation is not visualized. Aortic valve sclerosis/calcification is present, without any evidence of aortic stenosis. Aortic valve mean gradient measures 4.0 mmHg. Aortic valve peak gradient measures 6.8 mmHg. Aortic valve area, by VTI measures 1.59 cm. Pulmonic Valve: The pulmonic valve was not well visualized. Pulmonic valve regurgitation is not visualized. Aorta: The aortic root and ascending aorta are structurally normal, with no evidence of dilitation. Venous: The inferior vena cava is dilated in size with less than 50% respiratory variability, suggesting  right atrial pressure of 15 mmHg. IAS/Shunts: No atrial level shunt detected by color flow Doppler. Additional Comments: 3D imaging was not performed.  LEFT VENTRICLE PLAX 2D LV EF:         Left            Diastology                ventricular     LV e' medial:    5.81 cm/s                ejection        LV E/e' medial:  13.9                fraction by     LV e' lateral:   8.81 cm/s                PLAX is 59      LV E/e' lateral: 9.1                %. LVIDd:         5.55 cm  LVIDs:         3.80 cm LV PW:         1.65 cm LV IVS:        1.75 cm LVOT diam:     2.20 cm LV SV:         46 LV SV Index:   20 LVOT Area:     3.80 cm  RIGHT VENTRICLE            IVC RV Basal diam:  4.40 cm    IVC diam: 2.40 cm RV S prime:     8.93 cm/s TAPSE (M-mode): 1.8 cm LEFT ATRIUM             Index        RIGHT ATRIUM           Index LA diam:        4.30 cm 1.87 cm/m   RA Area:     20.00 cm LA Vol (A2C):   73.8 ml 32.10 ml/m  RA Volume:   58.90 ml  25.62 ml/m LA Vol (A4C):   54.1 ml 23.53 ml/m LA Biplane Vol: 64.3 ml 27.97 ml/m  AORTIC VALVE                    PULMONIC VALVE AV Area (Vmax):    1.83 cm     PV Vmax:       0.51 m/s AV Area (Vmean):   1.71 cm     PV Peak grad:  1.1 mmHg AV Area (VTI):     1.59 cm AV Vmax:           130.00 cm/s AV Vmean:          86.900 cm/s AV VTI:            0.289 m AV Peak Grad:      6.8 mmHg AV Mean Grad:      4.0 mmHg LVOT Vmax:         62.60 cm/s LVOT Vmean:        39.000 cm/s LVOT VTI:          0.121 m LVOT/AV VTI ratio: 0.42  AORTA Ao Root diam: 3.00 cm Ao Asc diam:  3.60 cm MITRAL VALVE               TRICUSPID VALVE MV Area (PHT): 4.41 cm    TR Peak grad:   47.6 mmHg MV Decel Time: 172 msec    TR Vmax:        345.00 cm/s MV E velocity: 80.60 cm/s MV A velocity: 16.30 cm/s  SHUNTS MV E/A ratio:  4.94        Systemic VTI:  0.12 m                            Systemic Diam: 2.20 cm Zoila Shutter MD Electronically signed by Zoila Shutter MD Signature Date/Time: 12/15/2023/11:41:54 AM    Final     Scheduled Meds:  amiodarone  200 mg Oral QHS   aspirin  81 mg Oral Daily   enoxaparin (LOVENOX) injection  60 mg Subcutaneous Q24H   fluticasone  2 spray Each Nare Daily   gemfibrozil  600 mg Oral QHS   hydrALAZINE  10 mg Oral Q8H   insulin aspart  0-15 Units Subcutaneous TID WC   insulin aspart  0-5 Units Subcutaneous QHS   insulin glargine-yfgn  10 Units Subcutaneous  QHS   pantoprazole  40 mg Oral Daily   potassium chloride  20 mEq Oral BID   Continuous Infusions:   LOS: 3 days   Time spent: 40 minutes  Carollee Herter, DO  [facility]  12/17/2023, 9:44 AM

## 2023-12-17 NOTE — Assessment & Plan Note (Addendum)
 12-17-2023 coreg stopped by cards due to concern for undiagnosed bradycardia. Will need to hold ARB due to rise in Scr due to diuresis. DC to home with hydralazine 10 mg tid. Restart demadex tomorrow 40 qam 20 mg qpm. F/u with cards in office in 2-4 weeks to consider restarting ACEI(after repeat BMP) vs continuing with hydralazine.

## 2023-12-18 ENCOUNTER — Telehealth: Payer: Self-pay | Admitting: *Deleted

## 2023-12-18 NOTE — Transitions of Care (Post Inpatient/ED Visit) (Signed)
   12/18/2023  Name: GREGGORY SAFRANEK MRN: [id number] DOB: [birth year]  Today's TOC FU Call Status: Today's TOC FU Call Status:: Unsuccessful Call (1st Attempt) Unsuccessful Call (1st Attempt) Date: 12/18/23  Attempted to reach the patient regarding the most recent Inpatient/ED visit.  Follow Up Plan: Additional outreach attempts will be made to reach the patient to complete the Transitions of Care (Post Inpatient/ED visit) call.   Gean Maidens BSN RN [facility] Health Care Management Coordinator [email or web address] Direct Dial: [phone]  Fax: [phone] Website: [email or web address]

## 2023-12-19 ENCOUNTER — Encounter: Payer: Self-pay | Admitting: Nurse Practitioner

## 2023-12-19 ENCOUNTER — Telehealth: Payer: Self-pay

## 2023-12-19 ENCOUNTER — Ambulatory Visit: Attending: Nurse Practitioner | Admitting: Nurse Practitioner

## 2023-12-19 VITALS — BP 128/72 | HR 70 | Ht 70.0 in | Wt 245.0 lb

## 2023-12-19 DIAGNOSIS — I4819 Other persistent atrial fibrillation: Secondary | ICD-10-CM

## 2023-12-19 DIAGNOSIS — E785 Hyperlipidemia, unspecified: Secondary | ICD-10-CM

## 2023-12-19 DIAGNOSIS — Z01810 Encounter for preprocedural cardiovascular examination: Secondary | ICD-10-CM | POA: Diagnosis not present

## 2023-12-19 DIAGNOSIS — I10 Essential (primary) hypertension: Secondary | ICD-10-CM

## 2023-12-19 DIAGNOSIS — I272 Pulmonary hypertension, unspecified: Secondary | ICD-10-CM

## 2023-12-19 DIAGNOSIS — R0602 Shortness of breath: Secondary | ICD-10-CM | POA: Diagnosis not present

## 2023-12-19 DIAGNOSIS — N179 Acute kidney failure, unspecified: Secondary | ICD-10-CM

## 2023-12-19 DIAGNOSIS — I5032 Chronic diastolic (congestive) heart failure: Secondary | ICD-10-CM | POA: Diagnosis not present

## 2023-12-19 DIAGNOSIS — G4733 Obstructive sleep apnea (adult) (pediatric): Secondary | ICD-10-CM

## 2023-12-19 DIAGNOSIS — I2510 Atherosclerotic heart disease of native coronary artery without angina pectoris: Secondary | ICD-10-CM

## 2023-12-19 NOTE — Patient Instructions (Addendum)
 Medication Instructions:  Your physician recommends that you continue on your current medications as directed. Please refer to the Current Medication list given to you today.  Labwork: In 2-3 weeks at [facility]   Testing/Procedures: None   Follow-Up: Your physician recommends that you schedule a follow-up appointment in: 8 weeks   Any Other Special Instructions Will Be Listed Below (If Applicable).  If you need a refill on your cardiac medications before your next appointment, please call your pharmacy.

## 2023-12-19 NOTE — Progress Notes (Signed)
 Cardiology Office Note:  .   Date:  12/19/2023 ID:  SHANDY VI, DOB April 29, 1941, MRN [id number] PCP: Anabel Halon, MD  [facility] Providers Cardiologist:  Nona Dell, MD Electrophysiologist:  Hillis Range, MD (Inactive)    History of Present Illness: .   GILLES TRIMPE is a 83 y.o. male with a PMH of CAD, chronic CHF, persistent A-fib, s/p Watchman implantation in 2023, AVM, HTN, T2DM, mixed HLD, OSA, and former smoker, who presents today for hospital follow-up.   Last seen by Dr. Diona Browner on December 04, 2023.  At the time, patient noted gradually worsening DOE and fatigue over the last few months.  Was noted to have a 20 pound weight gain since last office visit in August 2024.  Carvedilol was discontinued, Demadex was increased to 40 mg twice daily for the next 3 to 4 days, then changed to standing dose of 40 mg in the morning and 20 mg in the afternoon.  Potassium supplement was started.  Echocardiogram was ordered to be updated.  Hospital stay in early March 2025 for acute hypoxic respiratory failure and acute on chronic CHF exacerbation.  CT scan was negative for acute PE.  Showed cardiomegaly with small pleural effusions, nonspecific mild mediastinal and right hilar adenopathy, scattered punctate pulmonary nodules measuring less than 5 mm.  Was recommended to consider noncontrast CT of the chest that could possibly consider in 12 months the patient was considered to be high risk.  TTE revealed EF 55 to 60%, moderate LVH, mildly reduced right ventricular systolic function, severely elevated PASP, estimated right ventricular systolic pressure at 62.6 mmHg.   Today presents for hospital follow-up.  He is also pending endoscopy later this month with GI. He states he had a recent fall at home after chair was slick and fell out of chair at home, neighbor came and helped him up. Denies any syncope, acute injuries, or head injuries. Says overall he is doing fairly well, breathing better  since leaving the hospital, but does admit to weakness, says he feels like he lost his upper body strength. Denies any chest pain, shortness of breath, palpitations, syncope, presyncope, dizziness, orthopnea, PND, swelling or significant weight changes, acute bleeding, or claudication.  ROS: Negative. See HPI.   Studies Reviewed: [name]    EKG: EKG is not ordered today.   Echo 12/2023:  1. Left ventricular ejection fraction, by estimation, is 55 to 60%. Left  ventricular ejection fraction by PLAX is 59 %. The left ventricle has  normal function. The left ventricle has no regional wall motion  abnormalities. There is moderate concentric  left ventricular hypertrophy. Left ventricular diastolic parameters are  indeterminate.   2. Right ventricular systolic function is mildly reduced. The right  ventricular size is normal. There is severely elevated pulmonary artery  systolic pressure. The estimated right ventricular systolic pressure is  62.6 mmHg.   3. The mitral valve is grossly normal. No evidence of mitral valve  regurgitation.   4. The aortic valve is tricuspid. Aortic valve regurgitation is not  visualized. Aortic valve sclerosis/calcification is present, without any  evidence of aortic stenosis.   5. The inferior vena cava is dilated in size with <50% respiratory  variability, suggesting right atrial pressure of 15 mmHg.   Comparison(s): Changes from prior study are noted. 06/06/2022: LVEF 60-65%, mild LVH, low normal RV function.   CT cardiac 08/2022: IMPRESSION: 1.  Mild bi atrial enlargement   2.  Trans septal puncture site sealed  with no shunt   3. 31 mm Watchman FLX with 1/3 medial shoulder and 2.2 mm medial gap. Device is well endothelialized at surface. Average compression 15% Distal 1/3 of device and LAA not thrombosed and accepts contrast   4.  No pericardial effusion   5.  Normal ascending thoracic aorta 3.4 cm   6.  Normal PV anatomy  IMPRESSION: 1. Mildly  prominent subcarinal lymph node, 1.6 cm in short axis, previously 2.1 cm on 05/31/2021. This is nonspecific but could be reactive. 2. Mild peripheral subpleural reticulation in the lungs, cannot exclude mild fibrosis. 3. Healing subacute right lateral rib fractures. Old left lateral lower rib fractures. 4. Scattered mild scarring or atelectasis and peripheral subpleural reticulation, unchanged from prior, cannot exclude mild fibrosis. 5. Descending thoracic aortic atherosclerotic vascular disease. 6. Mild peripheral scarring or atelectasis in both lungs. 7. Mild mediastinal lipomatosis. This is a benign incidental finding. 8. Aortic atherosclerosis.   Aortic Atherosclerosis (ICD10-I70.0).  Lexiscan 01/2020:  Nuclear stress EF: 31%. There was no ST segment deviation noted during stress. Findings consistent with prior myocardial infarction. This is a high risk study. The left ventricular ejection fraction is moderately decreased (30-44%).   Low exercise tolerance (3 METs), unable to reach target heart rate, so study converted to pharmacologic. Baseline blood pressure very elevated at baseline (198/96). There are diffuse perfusion defects at rest and with stress, sparing only the mid-lateral wall. There is no significant reversibility on this study. Based on distribution, would be concerned that this is artifact. However, with low EF, suggests diffuse areas of poor perfusion without significant reversibility.   LHC 03/2015:  IMPRESSION:Mr. Stankey has noncritical CAD with severe LV dysfunction. I think that his inferior perfusion abnormality was artifactual. I do not think his moderate proximal LAD lesion is hemostatically significant. 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 The patient received 5000 units of heparin intravenously. He received radial cocktail. The SideArm sheath. A total of 55 mL of contrast was used for the case. The sheath was  removed and a TR band was placed on the right wrist to achieve patent hemostasis. The patient left the lab in stable condition. He'll be discharged home in 2 hours and will follow-up with me in the office in several weeks.  Cardiac monitor 02/2015: Paroxysmal atrial fib   The patient was in atrial fib for a significant amount of the monitored time. Many days he had atrial fib for almost the entire day.   He had several pauses of > 2 seconds.  No prolonged pauses that would cause syncope  Physical Exam:   VS:  BP 128/72   Pulse 70   Ht 5\' 10"  (1.778 m)   Wt 245 lb (111.1 kg)   SpO2 96%   BMI 35.15 kg/m    Wt Readings from Last 3 Encounters:  12/19/23 245 lb (111.1 kg)  12/17/23 240 lb 4.8 oz (109 kg)  12/09/23 258 lb 3.2 oz (117.1 kg)    GEN: Obese, 83 y.o. male in no acute distress NECK: No JVD; No carotid bruits CARDIAC: S1/S2, RRR, no murmurs, rubs, gallops RESPIRATORY:  Clear to auscultation without rales, wheezing or rhonchi  ABDOMEN: Soft, non-tender, non-distended EXTREMITIES:  No edema; No deformity   ASSESSMENT AND PLAN: .    Pre-operative cardiovascular risk assessment } Mr. Linville's perioperative risk of a major cardiac event is 11% according to the Revised Cardiac Risk Index (RCRI).  Therefore, he is at high  risk for perioperative complications.   His functional capacity is good at 6.05 METs according to the Duke Activity Status Index (DASI). Recommendations: According to ACC/AHA guidelines, no further cardiovascular testing needed.  The patient may proceed to surgery at acceptable risk.   Antiplatelet and/or Anticoagulation Recommendations: The patient should remain on Aspirin without interruption.  Patient is not on any anticoagulation therapy. Will route note to requesting party.   2. HFpEF, pulmonary hypertension, shortness of breath Stage C, NYHA class I-II symptoms. EF 55-60% 12/2023. Recent Echo revealed elevated pulmonary pressures, severely elevated PASP,  etiology most likely d/t group II/III. Will cancel Echo scheduled for tomorrow as this was just updated in the hospital. Euvolemic and well compensated on exam. Pt is asymptomatic. Continue current medication regimen. Plan to update limited Echo in 8 weeks to re-evaluate pulmonary pressures. Low sodium diet, fluid restriction <2L, and daily weights encouraged. Educated to contact our office for weight gain of 2 lbs overnight or 5 lbs in one week.  3. CAD Hx of occluded first diagonal and moderate LAD/PDA dx, has been medically managed. Stable with no anginal symptoms. No indication for ischemic evaluation. No medication changes at this time. Heart healthy diet and regular cardiovascular exercise encouraged.   4. Persistent A-fib, s/p Watchman implantation in 2023 Denies any tachycardia or palpitations. HR is well controlled. Doing well s/p Watchman implantation in 2023. Continue ASA, not on OAC. Continue amiodarone.  Heart healthy diet and regular cardiovascular exercise encouraged. Continue to follow-up with EP.   5. HTN BP well controlled. Discussed to monitor BP at home at least 2 hours after medications and sitting for 5-10 minutes. No medication changes at this time. Heart healthy diet and regular cardiovascular exercise encouraged.   6. HLD LDL 87 07/2023. Being managed by PCP. Continue current medication regimen. Heart healthy diet and regular cardiovascular exercise encouraged.   7. OSA on CPAP Encouraged continued compliance. Will route note to CMA for assistance with straps of his face mask.   8. AKI Noted during past hospitalization. Will repeat BMET in 2-3 weeks. Avoid nephrotoxic agents. No medication changes. Continue to follow with PCP.   Dispo: Follow-up with me/APP in 8 weeks or sooner if anything changes.   Signed, Sharlene Dory, NP

## 2023-12-19 NOTE — Transitions of Care (Post Inpatient/ED Visit) (Signed)
   12/19/2023  Name: Lucas Dudley MRN: [id number] DOB: Nov 07, 1940  Today's TOC FU Call Status: Today's TOC FU Call Status:: Unsuccessful Call (2nd Attempt) Unsuccessful Call (1st Attempt) Date: 12/18/23 Unsuccessful Call (2nd Attempt) Date: 12/19/23  Attempted to reach the patient regarding the most recent Inpatient/ED visit.  Follow Up Plan: Additional outreach attempts will be made to reach the patient to complete the Transitions of Care (Post Inpatient/ED visit) call.   Deidre Ala, BSN, RN [place]  [facility] - [employer] [facility] RN Care Manager [phone]

## 2023-12-20 ENCOUNTER — Ambulatory Visit (HOSPITAL_COMMUNITY): Payer: Medicare Other

## 2023-12-23 ENCOUNTER — Encounter: Payer: Self-pay | Admitting: Nurse Practitioner

## 2023-12-24 ENCOUNTER — Other Ambulatory Visit

## 2023-12-26 ENCOUNTER — Telehealth: Payer: Self-pay

## 2023-12-26 NOTE — Telephone Encounter (Signed)
 Left VM with callback number to discuss scheduling Sleep Clinic appointment to get prescription for CPAP Supplies.

## 2023-12-26 NOTE — Telephone Encounter (Signed)
-----   Message from Sharlene Dory sent at 12/20/2023  2:01 PM EST ----- Hello Chanon Loney,   Hope you're doing well. I believe you are the one I need to contact regarding this pt. He is having a hard time with the straps of his CPAP mask. Wanted to see if he needs to come see y'all to help with this. Please advise.   Thanks!   Best, Sharlene Dory, NP

## 2023-12-30 ENCOUNTER — Other Ambulatory Visit: Payer: Self-pay | Admitting: Internal Medicine

## 2023-12-30 ENCOUNTER — Other Ambulatory Visit: Payer: Medicare Other

## 2023-12-30 DIAGNOSIS — R5383 Other fatigue: Secondary | ICD-10-CM

## 2023-12-30 DIAGNOSIS — Z09 Encounter for follow-up examination after completed treatment for conditions other than malignant neoplasm: Secondary | ICD-10-CM

## 2023-12-30 DIAGNOSIS — I480 Paroxysmal atrial fibrillation: Secondary | ICD-10-CM

## 2023-12-30 DIAGNOSIS — I5042 Chronic combined systolic (congestive) and diastolic (congestive) heart failure: Secondary | ICD-10-CM

## 2023-12-30 DIAGNOSIS — I10 Essential (primary) hypertension: Secondary | ICD-10-CM

## 2023-12-31 ENCOUNTER — Telehealth: Payer: Self-pay | Admitting: Nurse Practitioner

## 2023-12-31 ENCOUNTER — Ambulatory Visit: Admitting: Internal Medicine

## 2023-12-31 ENCOUNTER — Encounter: Payer: Self-pay | Admitting: Internal Medicine

## 2023-12-31 VITALS — BP 119/70 | HR 56 | Ht 70.0 in | Wt 249.2 lb

## 2023-12-31 DIAGNOSIS — I5043 Acute on chronic combined systolic (congestive) and diastolic (congestive) heart failure: Secondary | ICD-10-CM

## 2023-12-31 DIAGNOSIS — E1169 Type 2 diabetes mellitus with other specified complication: Secondary | ICD-10-CM | POA: Diagnosis not present

## 2023-12-31 DIAGNOSIS — I10 Essential (primary) hypertension: Secondary | ICD-10-CM | POA: Diagnosis not present

## 2023-12-31 DIAGNOSIS — I480 Paroxysmal atrial fibrillation: Secondary | ICD-10-CM | POA: Diagnosis not present

## 2023-12-31 DIAGNOSIS — D500 Iron deficiency anemia secondary to blood loss (chronic): Secondary | ICD-10-CM

## 2023-12-31 DIAGNOSIS — J209 Acute bronchitis, unspecified: Secondary | ICD-10-CM

## 2023-12-31 DIAGNOSIS — Z794 Long term (current) use of insulin: Secondary | ICD-10-CM

## 2023-12-31 MED ORDER — AMOXICILLIN-POT CLAVULANATE 875-125 MG PO TABS
1.0000 | ORAL_TABLET | Freq: Two times a day (BID) | ORAL | 0 refills | Status: DC
Start: 2023-12-31 — End: 2024-01-13

## 2023-12-31 MED ORDER — METFORMIN HCL ER 500 MG PO TB24: 500.0000 mg | ORAL_TABLET | Freq: Two times a day (BID) | ORAL | 1 refills | Status: DC

## 2023-12-31 NOTE — Assessment & Plan Note (Signed)
 Has had exertional dyspnea and fatigue, could be due to acute on chronic CHF Recently increased dose of torsemide to 20 mg every morning and 40 mg every afternoon Daily weight checks Followed by cardiology Held Coreg and lisinopril recently Has chronic leg swelling and orthopnea

## 2023-12-31 NOTE — Assessment & Plan Note (Addendum)
 Started Augmentin considering sinusitis symptoms Promethazine DM syrup as needed for cough Check CXR

## 2023-12-31 NOTE — Assessment & Plan Note (Addendum)
 Lab Results  Component Value Date   HGBA1C 8.3 (H) 11/11/2023   Uncontrolled, but improving Associated with diabetic retinopathy, HTN, CAD and HLD On Basaglar 38 U at bedtime, Metformin 1000 mg BID and Glimepiride 2 mg once daily  Decrease Metformin to 500 mg BID due to CKD and increase Glimepiride dose to 2 mg BID Hypoglycemia episodes in the morning likely due to late dose of glimepiride, he takes it at bedtime - advised to take it with supper Does not want to start GLP1 agonist Advised to follow diabetic diet On ACEi F/u CMP and lipid panel Diabetic eye exam: Advised to follow up with Ophthalmology for diabetic eye exam

## 2023-12-31 NOTE — Telephone Encounter (Signed)
 Appointment Request From: Lauralyn Primes   With Provider: Nona Dell [facility]]   Preferred Date Range: 01/14/2024 - 01/21/2024   Preferred Times: Monday Afternoon, Tuesday Afternoon, Wednesday Afternoon, Thursday Afternoon, Friday Afternoon   Reason for visit: Office Visit   Health Maintenance Topic:    Comments: Dad is not feeling any better when it comes to his energy and his hemoglobin is 11.2 as of today.  I am worried that it is his heart and being missed because of his congestive heart failure.  Is there a chance that a cardiac scoring CT could be done to see if he has any significant blockages going on.  In the past his stress tests showed some small blockages but years can change that...Marland KitchenMarland Kitchen

## 2023-12-31 NOTE — Patient Instructions (Addendum)
 Please start taking Augmentin as prescribed.  Please take Metformin only 1 tablet twice daily and start taking Glimepiride 2 mg twice daily.  Please continue taking Torsemide as prescribed.  Please get X-ray chest done.

## 2023-12-31 NOTE — Assessment & Plan Note (Addendum)
 BP Readings from Last 1 Encounters:  12/31/23 119/70   Well controlled with Hydralazine 10 mg TID and Torsemide 20 mg BID Held Coreg due to bradycardia and lisinopril 40 mg QD due to recent worsening of kidney function BP wnl currently, no change in medications for now Counseled for compliance with the medications Advised DASH diet and moderate exercise/walking, at least 150 mins/week

## 2023-12-31 NOTE — Assessment & Plan Note (Signed)
 Currently in sinus rhythm On Amiodarone 200 mg QD Was on Coreg 3.125 mg BID, but recently discontinued due to bradycardia Has watchman device in place, had GI bleeding due to Eliquis

## 2023-12-31 NOTE — Progress Notes (Signed)
 Established Patient Office Visit  Subjective:  Patient ID: Lucas Dudley, male    DOB: 11/21/1940  Age: 83 y.o. MRN: [id number]  CC:  Chief Complaint  Patient presents with   Shortness of Breath    Pt reports sx of sob.     HPI Lucas Dudley is a 83 y.o. male with past medical history of HTN, CAD, HFrEF, A-fib s/p watchman device, type II DM with retinopathy, HLD, OSA, prostate ca. and morbid obesity who presents for follow-up after recent hospitalization.  He presented to ED with complaints of chest pain, and bilateral lower extremity swelling with difficulty breathing on 12/14/23.  He had exertional dyspnea despite increasing dose of torsemide by his cardiologist in the outpatient setting.  CTA chest was negative for PE, but showed small pleural effusions.  He was given IV Lasix during hospitalization.  He had gained about 20 lbs more than his baseline before the admission, but has lost about 10 lbs since then. Coreg was held due to bradycardia. Due to decline in kidney function, lisinopril was held and he was instead given hydralazine for HTN. He was discharged on 12/17/23 to home.  He reports exertional dyspnea and fatigue since being discharged from the hospital. His Hb has slightly improved to 11.7.  He still takes torsemide 20 mg every morning and 40 mg every afternoon.  He reports recent worsening of cough, nasal congestion and postnasal drip for the last 1 week as well.    Past Medical History:  Diagnosis Date   Arthritis    Atrial fibrillation (HCC)    CAD (coronary artery disease)    a. Cath 03/17/15 showing 100% ostial D1, 50% prox LAD to mid LAD, 40% RPDA stenosis. Med rx. // Myoview 01/2020: EF 31 diffuse perfusion defect without reversibility (suspect artifact); reviewed with Dr. Tama High study felt to be low risk   Cataract    Mixed form OD   Chronic diastolic CHF (congestive heart failure) (HCC)    Chronically elevated hemidiaphragm - Right Side    Diabetic  retinopathy (HCC)    NPDR OU   Essential hypertension    Glaucoma    POAG OU   History of gout    Hyperlipidemia    Hypertensive retinopathy    OU   Kidney stones    Melanoma of neck (HCC)    NICM (nonischemic cardiomyopathy) (HCC)    OSA on CPAP 2012   Prostate cancer (HCC)    Type II diabetes mellitus (HCC)     Past Surgical History:  Procedure Laterality Date   ABDOMINAL HERNIA REPAIR     w/mesh   ATRIAL FIBRILLATION ABLATION N/A 06/06/2021   Procedure: ATRIAL FIBRILLATION ABLATION;  Surgeon: Hillis Range, MD;  Location: [facility];  Service: Cardiovascular;  Laterality: N/A;   BIOPSY  05/24/2022   Procedure: BIOPSY;  Surgeon: Corbin Ade, MD;  Location: [facility];  Service: Endoscopy;;   BIOPSY  05/30/2022   Procedure: BIOPSY;  Surgeon: Corbin Ade, MD;  Location: [facility];  Service: Endoscopy;;   BIOPSY  01/10/2023   Procedure: BIOPSY;  Surgeon: Lemar Lofty., MD;  Location: [facility];  Service: Gastroenterology;;   CARDIAC CATHETERIZATION N/A 03/17/2015   Procedure: Left Heart Cath and Coronary Angiography;  Surgeon: Runell Gess, MD;  Location: [facility] INVASIVE CV LAB;  Service: Cardiovascular;  Laterality: N/A;   CARDIOVERSION N/A 08/09/2021   Procedure: CARDIOVERSION;  Surgeon: Chilton Si, MD;  Location: [facility] ENDOSCOPY;  Service: Cardiovascular;  Laterality: N/A;   CARDIOVERSION N/A 11/10/2021   Procedure: CARDIOVERSION;  Surgeon: Thomasene Ripple, DO;  Location: [facility]OS[facility];  Service: Cardiovascular;  Laterality: N/A;   carotid doppler  09/17/2008   rigt and left ICAs 0-49%;mildly  abnormal   CATARACT EXTRACTION Left 2020   Dr. Alben Spittle   COLONOSCOPY N/A 05/16/2017   Procedure: COLONOSCOPY;  Surgeon: Malissa Hippo, MD;  Location: [facility];  Service: Endoscopy;  Laterality: N/A;  930   COLONOSCOPY WITH PROPOFOL N/A 05/24/2022   Procedure: COLONOSCOPY WITH PROPOFOL;  Surgeon: Corbin Ade, MD;  Location: [facility];  Service:  Endoscopy;  Laterality: N/A;   DOPPLER ECHOCARDIOGRAPHY  05/25/2009   EF 50-55%,LA mildly dilated, LV function normal   ELECTROPHYSIOLOGIC STUDY N/A 04/05/2015   Procedure: Cardioversion;  Surgeon: Thurmon Fair, MD;  Location: [facility];  Service: Cardiovascular;  Laterality: N/A;   ELECTROPHYSIOLOGIC STUDY N/A 09/06/2015   Procedure: Atrial Fibrillation Ablation;  Surgeon: Hillis Range, MD;  Location: [facility] INVASIVE CV LAB;  Service: Cardiovascular;  Laterality: N/A;   ELECTROPHYSIOLOGIC STUDY N/A 07/12/2016   redo afib ablation by Dr Johney Frame   ENTEROSCOPY N/A 05/30/2022   Procedure: ENTEROSCOPY;  Surgeon: Corbin Ade, MD;  [facility]: [facility];  Service: Endoscopy;  Laterality: N/A;   ESOPHAGOGASTRODUODENOSCOPY (EGD) WITH PROPOFOL N/A 05/24/2022   Procedure: ESOPHAGOGASTRODUODENOSCOPY (EGD) WITH PROPOFOL;  Surgeon: Corbin Ade, MD;  [facility];  Service: Endoscopy;  Laterality: N/A;   ESOPHAGOGASTRODUODENOSCOPY (EGD) WITH PROPOFOL N/A 01/10/2023   Procedure: ESOPHAGOGASTRODUODENOSCOPY (EGD) WITH PROPOFOL;  Surgeon: Meridee Score Netty Starring., MD;  Location: [facility] ENDOSCOPY;  Service: Gastroenterology;  Laterality: N/A;   EUS N/A 01/10/2023   Procedure: UPPER ENDOSCOPIC ULTRASOUND (EUS) RADIAL;  Surgeon: Lemar Lofty., MD;  Location: [facility] ENDOSCOPY;  Service: Gastroenterology;  Laterality: N/A;   EXCISIONAL HEMORRHOIDECTOMY     "inside and out"   EYE SURGERY Left 2020   Cat Sx - Dr. Alben Spittle   FINE NEEDLE ASPIRATION Right    knee; "drew ~ 1 quart off"   GIVENS CAPSULE STUDY N/A 05/25/2022   Procedure: GIVENS CAPSULE STUDY;  Surgeon: Lanelle Bal, DO;  Location: [facility];  Service: Endoscopy;  Laterality: N/A;   GIVENS CAPSULE STUDY N/A 01/23/2023   Procedure: GIVENS CAPSULE STUDY;  Surgeon: Dolores Frame, MD;  Location: [facility];  Service: Gastroenterology;  Laterality: N/A;  8:30am   HERNIA REPAIR     HOT HEMOSTASIS N/A 01/10/2023    Procedure: HOT HEMOSTASIS (ARGON PLASMA COAGULATION/BICAP);  Surgeon: Lemar Lofty., MD;  Location: [facility];  Service: Gastroenterology;  Laterality: N/A;   LAPAROSCOPIC CHOLECYSTECTOMY     LEFT ATRIAL APPENDAGE OCCLUSION N/A 06/28/2022   Procedure: LEFT ATRIAL APPENDAGE OCCLUSION;  Surgeon: Tonny Bollman, MD;  Location: [facility] INVASIVE CV LAB;  Service: Cardiovascular;  Laterality: N/A;   MELANOMA EXCISION Right    "neck"   NM MYOCAR PERF WALL MOTION  02/21/2012   EF 61% ,EXERCISE 7 METS. exercise stopped due to wheezing and shortness of breathe   POLYPECTOMY  05/16/2017   Procedure: POLYPECTOMY;  Surgeon: Malissa Hippo, MD;  [facility];  Service: Endoscopy;;  colon   POLYPECTOMY  05/24/2022   Procedure: POLYPECTOMY;  Surgeon: Corbin Ade, MD;  [facility];  Service: Endoscopy;;   POLYPECTOMY  01/10/2023   Procedure: POLYPECTOMY;  Surgeon: Meridee Score Netty Starring., MD;  Location: [facility];  Service: Gastroenterology;;   PROSTATECTOMY  SHOULDER OPEN ROTATOR CUFF REPAIR Right X 2   SUBMUCOSAL TATTOO INJECTION  05/30/2022   Procedure: SUBMUCOSAL TATTOO INJECTION;  Surgeon: Corbin Ade, MD;  Location: [facility];  Service: Endoscopy;;   SUBMUCOSAL TATTOO INJECTION  01/10/2023   Procedure: SUBMUCOSAL TATTOO INJECTION;  Surgeon: Lemar Lofty., MD;  Location: [facility];  Service: Gastroenterology;;   TEE WITHOUT CARDIOVERSION N/A 09/05/2015   Procedure: TRANSESOPHAGEAL ECHOCARDIOGRAM (TEE);  Surgeon: Quintella Reichert, MD;  Location: [facility] ENDOSCOPY;  Service: Cardiovascular;  Laterality: N/A;   TEE WITHOUT CARDIOVERSION N/A 06/28/2022   Procedure: TRANSESOPHAGEAL ECHOCARDIOGRAM (TEE);  Surgeon: Tonny Bollman, MD;  Location: [facility] INVASIVE CV LAB;  Service: Cardiovascular;  Laterality: N/A;    Family History  Problem Relation Age of Onset   Heart disease Mother    Lung cancer Mother    Heart attack Mother 94   Diabetes Father    Heart  disease Father    Stroke Brother    Healthy Daughter    Colon cancer Maternal Aunt        24s    Social History   Socioeconomic History   Marital status: Widowed    Spouse name: Not on file   Number of children: 1   Years of education: Not on file   Highest education level: Not on file  Occupational History   Occupation: Retired  Tobacco Use   Smoking status: Former    Current packs/day: 0.00    Average packs/day: 2.0 packs/day for 27.0 years (54.0 ttl pk-yrs)    Types: Cigarettes    Start date: 52    Quit date: 1992    Years since quitting: 33.2    Passive exposure: Current   Smokeless tobacco: Never   Tobacco comments:    Former smoker 09/27/21  Vaping Use   Vaping status: Never Used  Substance and Sexual Activity   Alcohol use: No    Alcohol/week: 0.0 standard drinks of alcohol    Comment: "used to drink; stopped ~ 2008"   Drug use: No   Sexual activity: Not Currently  Other Topics Concern   Not on file  Social History Narrative   Lives in [place], [place] with wife.   Social Drivers of Corporate investment banker Strain: Low Risk  (12/17/2023)   Overall Financial Resource Strain (CARDIA)    Difficulty of Paying Living Expenses: Not hard at all  Food Insecurity: No Food Insecurity (12/17/2023)   Hunger Vital Sign    Worried About Running Out of Food in the Last Year: Never true    Ran Out of Food in the Last Year: Never true  Transportation Needs: No Transportation Needs (12/14/2023)   PRAPARE - Administrator, Civil Service (Medical): No    Lack of Transportation (Non-Medical): No  Physical Activity: Inactive (11/20/2023)   Exercise Vital Sign    Days of Exercise per Week: 0 days    Minutes of Exercise per Session: 0 min  Stress: No Stress Concern Present (11/20/2023)   [employer] of Occupational Health - Occupational Stress Questionnaire    Feeling of Stress : Not at all  Social Connections: Moderately Integrated (12/14/2023)   Social Connection  and Isolation Panel [NHANES]    Frequency of Communication with Friends and Family: More than three times a week    Frequency of Social Gatherings with Friends and Family: More than three times a week    Attends Religious Services: More than 4 times per year  Active Member of Clubs or Organizations: Yes    Attends Banker Meetings: More than 4 times per year    Marital Status: Widowed  Recent Concern: Social Connections - Moderately Isolated (11/20/2023)   Social Connection and Isolation Panel [NHANES]    Frequency of Communication with Friends and Family: More than three times a week    Frequency of Social Gatherings with Friends and Family: Once a week    Attends Religious Services: More than 4 times per year    Active Member of [employer] or Organizations: No    Attends Banker Meetings: Never    Marital Status: Widowed  Intimate Partner Violence: Not At Risk (12/14/2023)   Humiliation, Afraid, Rape, and Kick questionnaire    Fear of Current or Ex-Partner: No    Emotionally Abused: No    Physically Abused: No    Sexually Abused: No    Outpatient Medications Prior to Visit  Medication Sig Dispense Refill   acetaminophen (TYLENOL) 650 MG CR tablet Take 1,300 mg by mouth at bedtime.     albuterol (VENTOLIN HFA) 108 (90 Base) MCG/ACT inhaler Inhale 2 puffs into the lungs every 6 (six) hours as needed for wheezing or shortness of breath.     amiodarone (PACERONE) 200 MG tablet TAKE 1 TABLET BY MOUTH EVERY DAY (Patient taking differently: Take 200 mg by mouth at bedtime.) 90 tablet 1   ammonium lactate (AMLACTIN DAILY) 12 % lotion Apply 1 Application topically as needed. 400 g 0   Artificial Tear Ointment (DRY EYES OP) Place 1 drop into both eyes daily as needed (Dry eye).     aspirin 81 MG chewable tablet Chew 81 mg by mouth daily.     camphor-menthol (ANTI-ITCH) lotion Apply 1 Application topically daily as needed for itching.     COMBIGAN 0.2-0.5 % ophthalmic  solution Place 1 drop into both eyes 2 (two) times daily.     ELDERBERRY PO Take 2 tablets by mouth daily. Chewable     ferrous sulfate 325 (65 FE) MG tablet Take 1 tablet (325 mg total) by mouth daily with breakfast. 120 tablet 1   fluticasone (FLONASE) 50 MCG/ACT nasal spray SPRAY 2 SPRAYS INTO EACH NOSTRIL EVERY DAY 48 mL 1   gemfibrozil (LOPID) 600 MG tablet Take 600 mg by mouth at bedtime.      glimepiride (AMARYL) 2 MG tablet Take 1 tablet (2 mg total) by mouth 2 (two) times daily with a meal. (Patient taking differently: Take 2 mg by mouth daily with breakfast.) 60 tablet 3   hydrALAZINE (APRESOLINE) 10 MG tablet Take 1 tablet (10 mg total) by mouth 3 (three) times daily. 270 tablet 0   Insulin Glargine (BASAGLAR KWIKPEN) 100 UNIT/ML Inject 45 Units into the skin at bedtime. (Patient taking differently: Inject 38 Units into the skin at bedtime.) 15 mL 3   lisinopril (ZESTRIL) 40 MG tablet Take 1 tablet (40 mg total) by mouth daily. 90 tablet 1   magnesium gluconate (MAGONATE) 500 MG tablet Take 500 mg by mouth daily.     mineral oil liquid Take 15 mLs by mouth daily as needed for moderate constipation.     OVER THE COUNTER MEDICATION Take 1 tablet by mouth daily. Beets Chew     pantoprazole (PROTONIX) 40 MG tablet TAKE 1 TABLET BY MOUTH EVERY DAY 90 tablet 1   potassium chloride (KLOR-CON) 10 MEQ tablet Take 1 tablet (10 mEq total) by mouth daily. 90 tablet 1   torsemide (  DEMADEX) 20 MG tablet 40 Mg in the morning and 20 Mg in the evening     zinc gluconate 50 MG tablet Take 50 mg by mouth daily.     metFORMIN (GLUCOPHAGE-XR) 500 MG 24 hr tablet Take 1,000 mg by mouth in the morning and at bedtime.     No facility-administered medications prior to visit.    Allergies  Allergen Reactions   Azithromycin Nausea Only   Tape Rash and Other (See Comments)    Causes skin redness, Use paper tape only.    ROS Review of Systems  Constitutional:  Positive for fatigue. Negative for chills  and fever.  HENT:  Positive for congestion and sinus pressure. Negative for sore throat.   Eyes:  Negative for pain and discharge.  Respiratory:  Positive for cough and shortness of breath (Intermittent).   Cardiovascular:  Positive for leg swelling. Negative for chest pain and palpitations.  Gastrointestinal:  Negative for diarrhea, nausea and vomiting.  Endocrine: Negative for polydipsia and polyuria.  Genitourinary:  Negative for dysuria and hematuria.  Musculoskeletal:  Negative for neck pain and neck stiffness.  Skin:  Negative for rash.  Neurological:  Negative for dizziness and weakness.  Psychiatric/Behavioral:  Negative for agitation and behavioral problems.       Objective:    Physical Exam Vitals reviewed.  Constitutional:      General: He is not in acute distress.    Appearance: He is obese. He is not diaphoretic.  HENT:     Head: Normocephalic and atraumatic.     Nose: Congestion present.     Mouth/Throat:     Mouth: Mucous membranes are moist.  Eyes:     General: No scleral icterus.    Extraocular Movements: Extraocular movements intact.  Cardiovascular:     Rate and Rhythm: Normal rate and regular rhythm.     Heart sounds: Normal heart sounds. No murmur heard. Pulmonary:     Breath sounds: No wheezing or rales.  Abdominal:     Palpations: Abdomen is soft.     Tenderness: There is no abdominal tenderness.  Musculoskeletal:     Cervical back: Neck supple. No tenderness.     Right lower leg: Edema (1+) present.     Left lower leg: Edema (1+) present.  Skin:    General: Skin is warm.     Findings: No rash.  Neurological:     General: No focal deficit present.     Mental Status: He is alert and oriented to person, place, and time.     Cranial Nerves: No cranial nerve deficit.     Sensory: No sensory deficit.     Motor: No weakness.  Psychiatric:        Mood and Affect: Mood normal.        Behavior: Behavior normal.     BP 119/70   Pulse (!) 56   Ht  5\' 10"  (1.778 m)   Wt 249 lb 3.2 oz (113 kg)   SpO2 92%   BMI 35.76 kg/m  Wt Readings from Last 3 Encounters:  12/31/23 249 lb 3.2 oz (113 kg)  12/19/23 245 lb (111.1 kg)  12/17/23 240 lb 4.8 oz (109 kg)    Lab Results  Component Value Date   TSH 3.037 05/21/2022   Lab Results  Component Value Date   WBC 8.4 12/30/2023   HGB 11.7 (L) 12/30/2023   HCT 37.4 (L) 12/30/2023   MCV 93 12/30/2023   PLT 465 (H) 12/30/2023  Lab Results  Component Value Date   NA 140 12/30/2023   K 4.6 12/30/2023   CO2 27 12/30/2023   GLUCOSE 153 (H) 12/30/2023   BUN 24 12/30/2023   CREATININE 1.71 (H) 12/30/2023   BILITOT 0.3 12/30/2023   ALKPHOS 77 12/30/2023   AST 16 12/30/2023   ALT 8 12/30/2023   PROT 7.3 12/30/2023   ALBUMIN 4.2 12/30/2023   CALCIUM 9.4 12/30/2023   ANIONGAP 9 12/17/2023   EGFR 40 (L) 12/30/2023   GFR 76.23 05/04/2015   Lab Results  Component Value Date   CHOL 147 07/22/2023   Lab Results  Component Value Date   HDL 24 (L) 07/22/2023   Lab Results  Component Value Date   LDLCALC 87 07/22/2023   Lab Results  Component Value Date   TRIG 213 (H) 07/22/2023   Lab Results  Component Value Date   CHOLHDL 6.1 (H) 07/22/2023   Lab Results  Component Value Date   HGBA1C 8.3 (H) 11/11/2023      Assessment & Plan:   Problem List Items Addressed This Visit       Cardiovascular and Mediastinum   Essential hypertension   BP Readings from Last 1 Encounters:  12/31/23 119/70   Well controlled with Hydralazine 10 mg TID and Torsemide 20 mg BID Held Coreg due to bradycardia and lisinopril 40 mg QD due to recent worsening of kidney function BP wnl currently, no change in medications for now Counseled for compliance with the medications Advised DASH diet and moderate exercise/walking, at least 150 mins/week      Paroxysmal atrial fibrillation (HCC)   Currently in sinus rhythm On Amiodarone 200 mg QD Was on Coreg 3.125 mg BID, but recently discontinued  due to bradycardia Has watchman device in place, had GI bleeding due to Eliquis      Acute on chronic combined systolic and diastolic CHF (congestive heart failure) (HCC) - dry Weight 109 kg/240.3 lbs. dry weight BNP of 111 - Primary   Has had exertional dyspnea and fatigue, could be due to acute on chronic CHF Recently increased dose of torsemide to 20 mg every morning and 40 mg every afternoon Daily weight checks Followed by cardiology Held Coreg and lisinopril recently Has chronic leg swelling and orthopnea        Respiratory   Acute bronchitis   Started Augmentin considering sinusitis symptoms Promethazine DM syrup as needed for cough Check CXR      Relevant Medications   amoxicillin-clavulanate (AUGMENTIN) 875-125 MG tablet   Other Relevant Orders   DG Chest 2 View     Endocrine   Type 2 diabetes mellitus with other specified complication (HCC)   Lab Results  Component Value Date   HGBA1C 8.3 (H) 11/11/2023   Uncontrolled, but improving Associated with diabetic retinopathy, HTN, CAD and HLD On Basaglar 38 U at bedtime, Metformin 1000 mg BID and Glimepiride 2 mg once daily  Decrease Metformin to 500 mg BID due to CKD and increase Glimepiride dose to 2 mg BID Hypoglycemia episodes in the morning likely due to late dose of glimepiride, he takes it at bedtime - advised to take it with supper Does not want to start GLP1 agonist Advised to follow diabetic diet On ACEi F/u CMP and lipid panel Diabetic eye exam: Advised to follow up with Ophthalmology for diabetic eye exam      Relevant Medications   metFORMIN (GLUCOPHAGE-XR) 500 MG 24 hr tablet     Other   Anemia due  to chronic blood loss   Due to Eliquis in the past Hb stable ~11 now Has had iron transfusions Continue oral iron supplement Followed by Hematology       Meds ordered this encounter  Medications   metFORMIN (GLUCOPHAGE-XR) 500 MG 24 hr tablet    Sig: Take 1 tablet (500 mg total) by mouth 2  (two) times daily with a meal.    Dispense:  180 tablet    Refill:  1   amoxicillin-clavulanate (AUGMENTIN) 875-125 MG tablet    Sig: Take 1 tablet by mouth 2 (two) times daily.    Dispense:  14 tablet    Refill:  0    Follow-up: Return if symptoms worsen or fail to improve.    Anabel Halon, MD

## 2023-12-31 NOTE — Assessment & Plan Note (Signed)
 Due to Eliquis in the past Hb stable ~11 now Has had iron transfusions Continue oral iron supplement Followed by Hematology

## 2024-01-01 ENCOUNTER — Ambulatory Visit: Admitting: Cardiology

## 2024-01-01 NOTE — Progress Notes (Signed)
 [facility] - Clinic Note  01/03/2024    CHIEF COMPLAINT Patient presents for Retina Follow Up  HISTORY OF PRESENT ILLNESS: Lucas Dudley is a 83 y.o. male who presents to the clinic today for:  HPI     Retina Follow Up   Patient presents with  Diabetic Retinopathy.  In both eyes.  Severity is mild.  Duration of 5 weeks.  Since onset it is stable.  I, the attending physician,  performed the HPI with the patient and updated documentation appropriately.        Comments   5 week Retina eval. Patient states no vision changes noticed. Blood sugar 83      Last edited by Rennis Chris, MD on 01/03/2024  4:04 PM.      Referring physician: Anabel Halon, MD [address] [place],  [place] [address]  HISTORICAL INFORMATION:   Selected notes from the MEDICAL RECORD NUMBER Referred by Dr. Alben Spittle for DEE   CURRENT MEDICATIONS: Current Outpatient Medications (Ophthalmic Drugs)  Medication Sig   Artificial Tear Ointment (DRY EYES OP) Place 1 drop into both eyes daily as needed (Dry eye).   COMBIGAN 0.2-0.5 % ophthalmic solution Place 1 drop into both eyes 2 (two) times daily.   No current facility-administered medications for this visit. (Ophthalmic Drugs)   Current Outpatient Medications (Other)  Medication Sig   acetaminophen (TYLENOL) 650 MG CR tablet Take 1,300 mg by mouth at bedtime.   albuterol (VENTOLIN HFA) 108 (90 Base) MCG/ACT inhaler Inhale 2 puffs into the lungs every 6 (six) hours as needed for wheezing or shortness of breath.   amiodarone (PACERONE) 200 MG tablet TAKE 1 TABLET BY MOUTH EVERY DAY (Patient taking differently: Take 200 mg by mouth at bedtime.)   ammonium lactate (AMLACTIN DAILY) 12 % lotion Apply 1 Application topically as needed.   amoxicillin-clavulanate (AUGMENTIN) 875-125 MG tablet Take 1 tablet by mouth 2 (two) times daily.   aspirin 81 MG chewable tablet Chew 81 mg by mouth daily.   camphor-menthol (ANTI-ITCH) lotion Apply 1  Application topically daily as needed for itching.   ELDERBERRY PO Take 2 tablets by mouth daily. Chewable   ferrous sulfate 325 (65 FE) MG tablet Take 1 tablet (325 mg total) by mouth daily with breakfast.   fluticasone (FLONASE) 50 MCG/ACT nasal spray SPRAY 2 SPRAYS INTO EACH NOSTRIL EVERY DAY   gemfibrozil (LOPID) 600 MG tablet Take 600 mg by mouth at bedtime.    glimepiride (AMARYL) 2 MG tablet Take 1 tablet (2 mg total) by mouth 2 (two) times daily with a meal. (Patient taking differently: Take 2 mg by mouth daily with breakfast.)   hydrALAZINE (APRESOLINE) 10 MG tablet Take 2 tablets (20 mg total) by mouth 3 (three) times daily.   Insulin Glargine (BASAGLAR KWIKPEN) 100 UNIT/ML Inject 45 Units into the skin at bedtime. (Patient taking differently: Inject 38 Units into the skin at bedtime.)   magnesium gluconate (MAGONATE) 500 MG tablet Take 500 mg by mouth daily.   metFORMIN (GLUCOPHAGE-XR) 500 MG 24 hr tablet Take 1 tablet (500 mg total) by mouth 2 (two) times daily with a meal.   mineral oil liquid Take 15 mLs by mouth daily as needed for moderate constipation.   OVER THE COUNTER MEDICATION Take 1 tablet by mouth daily. Beets Chew   pantoprazole (PROTONIX) 40 MG tablet TAKE 1 TABLET BY MOUTH EVERY DAY   potassium chloride (KLOR-CON) 10 MEQ tablet Take 1 tablet (10 mEq  total) by mouth daily.   torsemide (DEMADEX) 20 MG tablet 40 Mg in the morning and 20 Mg in the evening   zinc gluconate 50 MG tablet Take 50 mg by mouth daily.   No current facility-administered medications for this visit. (Other)   REVIEW OF SYSTEMS: ROS   Positive for: Gastrointestinal, Endocrine, Cardiovascular, Eyes, Respiratory Negative for: Constitutional, Neurological, Skin, Genitourinary, Musculoskeletal, HENT, Psychiatric, Allergic/Imm, Heme/Lymph Last edited by Lana Fish, COT on 01/03/2024  1:58 PM.     ALLERGIES Allergies  Allergen Reactions   Azithromycin Nausea Only   Tape Rash and Other (See  Comments)    Causes skin redness, Use paper tape only.   PAST MEDICAL HISTORY Past Medical History:  Diagnosis Date   Arthritis    Atrial fibrillation (HCC)    CAD (coronary artery disease)    a. Cath 03/17/15 showing 100% ostial D1, 50% prox LAD to mid LAD, 40% RPDA stenosis. Med rx. // Myoview 01/2020: EF 31 diffuse perfusion defect without reversibility (suspect artifact); reviewed with Dr. Tama High study felt to be low risk   Cataract    Mixed form OD   Chronic diastolic CHF (congestive heart failure) (HCC)    Chronically elevated hemidiaphragm - Right Side    Diabetic retinopathy (HCC)    NPDR OU   Essential hypertension    Glaucoma    POAG OU   History of gout    Hyperlipidemia    Hypertensive retinopathy    OU   Kidney stones    Melanoma of neck (HCC)    NICM (nonischemic cardiomyopathy) (HCC)    OSA on CPAP 2012   Prostate cancer (HCC)    Type II diabetes mellitus (HCC)    Past Surgical History:  Procedure Laterality Date   ABDOMINAL HERNIA REPAIR     w/mesh   ATRIAL FIBRILLATION ABLATION N/A 06/06/2021   Procedure: ATRIAL FIBRILLATION ABLATION;  Surgeon: Hillis Range, MD;  Location: [facility] INVASIVE CV LAB;  Service: Cardiovascular;  Laterality: N/A;   BIOPSY  05/24/2022   Procedure: BIOPSY;  Surgeon: Corbin Ade, MD;  Location: [facility];  Service: Endoscopy;;   BIOPSY  05/30/2022   Procedure: BIOPSY;  Surgeon: Corbin Ade, MD;  Location: [facility];  Service: Endoscopy;;   BIOPSY  01/10/2023   Procedure: BIOPSY;  Surgeon: Lemar Lofty., MD;  Location: [facility] ENDOSCOPY;  Service: Gastroenterology;;   CARDIAC CATHETERIZATION N/A 03/17/2015   Procedure: Left Heart Cath and Coronary Angiography;  Surgeon: Runell Gess, MD;  Location: [facility] INVASIVE CV LAB;  Service: Cardiovascular;  Laterality: N/A;   CARDIOVERSION N/A 08/09/2021   Procedure: CARDIOVERSION;  Surgeon: Chilton Si, MD;  Location: [facility] ENDOSCOPY;  Service: Cardiovascular;   Laterality: N/A;   CARDIOVERSION N/A 11/10/2021   Procedure: CARDIOVERSION;  Surgeon: Thomasene Ripple, DO;  Location: [facility] ENDOSCOPY;  Service: Cardiovascular;  Laterality: N/A;   carotid doppler  09/17/2008   rigt and left ICAs 0-49%;mildly  abnormal   CATARACT EXTRACTION Left 2020   Dr. Alben Spittle   COLONOSCOPY N/A 05/16/2017   Procedure: COLONOSCOPY;  Surgeon: Malissa Hippo, MD;  Location: [facility];  Service: Endoscopy;  Laterality: N/A;  930   COLONOSCOPY WITH PROPOFOL N/A 05/24/2022   Procedure: COLONOSCOPY WITH PROPOFOL;  Surgeon: Corbin Ade, MD;  Location: [facility];  Service: Endoscopy;  Laterality: N/A;   DOPPLER ECHOCARDIOGRAPHY  05/25/2009   EF 50-55%,LA mildly dilated, LV function normal   ELECTROPHYSIOLOGIC STUDY N/A 04/05/2015   Procedure:  Cardioversion;  Surgeon: Thurmon Fair, MD;  Location: [facility] INVASIVE CV LAB;  Service: Cardiovascular;  Laterality: N/A;   ELECTROPHYSIOLOGIC STUDY N/A 09/06/2015   Procedure: Atrial Fibrillation Ablation;  Surgeon: Hillis Range, MD;  Location: [facility] INVASIVE CV LAB;  Service: Cardiovascular;  Laterality: N/A;   ELECTROPHYSIOLOGIC STUDY N/A 07/12/2016   redo afib ablation by Dr Johney Frame   ENTEROSCOPY N/A 05/30/2022   Procedure: ENTEROSCOPY;  Surgeon: Corbin Ade, MD;  [facility]: [facility];  Service: Endoscopy;  Laterality: N/A;   ESOPHAGOGASTRODUODENOSCOPY (EGD) WITH PROPOFOL N/A 05/24/2022   Procedure: ESOPHAGOGASTRODUODENOSCOPY (EGD) WITH PROPOFOL;  Surgeon: Corbin Ade, MD;  [facility]: [facility];  Service: Endoscopy;  Laterality: N/A;   ESOPHAGOGASTRODUODENOSCOPY (EGD) WITH PROPOFOL N/A 01/10/2023   Procedure: ESOPHAGOGASTRODUODENOSCOPY (EGD) WITH PROPOFOL;  Surgeon: Meridee Score Netty Starring., MD;  Location: [facility] ENDOSCOPY;  Service: Gastroenterology;  Laterality: N/A;   EUS N/A 01/10/2023   Procedure: UPPER ENDOSCOPIC ULTRASOUND (EUS) RADIAL;  Surgeon: Lemar Lofty., MD;  Location: [facility] ENDOSCOPY;  Service: Gastroenterology;   Laterality: N/A;   EXCISIONAL HEMORRHOIDECTOMY     "inside and out"   EYE SURGERY Left 2020   Cat Sx - Dr. Alben Spittle   FINE NEEDLE ASPIRATION Right    knee; "drew ~ 1 quart off"   GIVENS CAPSULE STUDY N/A 05/25/2022   Procedure: GIVENS CAPSULE STUDY;  Surgeon: Lanelle Bal, DO;  Location: [facility];  Service: Endoscopy;  Laterality: N/A;   GIVENS CAPSULE STUDY N/A 01/23/2023   Procedure: GIVENS CAPSULE STUDY;  Surgeon: Dolores Frame, MD;  Location: [facility];  Service: Gastroenterology;  Laterality: N/A;  8:30am   HERNIA REPAIR     HOT HEMOSTASIS N/A 01/10/2023   Procedure: HOT HEMOSTASIS (ARGON PLASMA COAGULATION/BICAP);  Surgeon: Lemar Lofty., MD;  Location: [facility] ENDOSCOPY;  Service: Gastroenterology;  Laterality: N/A;   LAPAROSCOPIC CHOLECYSTECTOMY     LEFT ATRIAL APPENDAGE OCCLUSION N/A 06/28/2022   Procedure: LEFT ATRIAL APPENDAGE OCCLUSION;  Surgeon: Tonny Bollman, MD;  Location: [facility] INVASIVE CV LAB;  Service: Cardiovascular;  Laterality: N/A;   MELANOMA EXCISION Right    "neck"   NM MYOCAR PERF WALL MOTION  02/21/2012   EF 61% ,EXERCISE 7 METS. exercise stopped due to wheezing and shortness of breathe   POLYPECTOMY  05/16/2017   Procedure: POLYPECTOMY;  Surgeon: Malissa Hippo, MD;  [facility];  Service: Endoscopy;;  colon   POLYPECTOMY  05/24/2022   Procedure: POLYPECTOMY;  Surgeon: Corbin Ade, MD;  [facility]: [facility];  Service: Endoscopy;;   POLYPECTOMY  01/10/2023   Procedure: POLYPECTOMY;  Surgeon: Meridee Score Netty Starring., MD;  Location: [facility] ENDOSCOPY;  Service: Gastroenterology;;   PROSTATECTOMY     SHOULDER OPEN ROTATOR CUFF REPAIR Right X 2   SUBMUCOSAL TATTOO INJECTION  05/30/2022   Procedure: SUBMUCOSAL TATTOO INJECTION;  Surgeon: Corbin Ade, MD;  Location: [facility];  Service: Endoscopy;;   SUBMUCOSAL TATTOO INJECTION  01/10/2023   Procedure: SUBMUCOSAL TATTOO INJECTION;  Surgeon: Lemar Lofty., MD;   Location: [facility] ENDOSCOPY;  Service: Gastroenterology;;   TEE WITHOUT CARDIOVERSION N/A 09/05/2015   Procedure: TRANSESOPHAGEAL ECHOCARDIOGRAM (TEE);  Surgeon: Quintella Reichert, MD;  Location: [facility] ENDOSCOPY;  Service: Cardiovascular;  Laterality: N/A;   TEE WITHOUT CARDIOVERSION N/A 06/28/2022   Procedure: TRANSESOPHAGEAL ECHOCARDIOGRAM (TEE);  Surgeon: Tonny Bollman, MD;  Location: [facility] INVASIVE CV LAB;  Service: Cardiovascular;  Laterality: N/A;   FAMILY HISTORY Family History  Problem Relation Age of Onset  Heart disease Mother    Lung cancer Mother    Heart attack Mother 86   Diabetes Father    Heart disease Father    Stroke Brother    Healthy Daughter    Colon cancer Maternal Aunt        24s   SOCIAL HISTORY Social History   Tobacco Use   Smoking status: Former    Current packs/day: 0.00    Average packs/day: 2.0 packs/day for 27.0 years (54.0 ttl pk-yrs)    Types: Cigarettes    Start date: 19    Quit date: 1992    Years since quitting: 33.2    Passive exposure: Current   Smokeless tobacco: Never   Tobacco comments:    Former smoker 09/27/21  Vaping Use   Vaping status: Never Used  Substance Use Topics   Alcohol use: No    Alcohol/week: 0.0 standard drinks of alcohol    Comment: "used to drink; stopped ~ 2008"   Drug use: No       OPHTHALMIC EXAM: Base Eye Exam     Visual Acuity (Snellen - Linear)       Right Left   Dist cc 20/20 -2 20/20 -2    Correction: Glasses         Tonometry (Tonopen, 2:06 PM)       Right Left   Pressure 20 14         Pupils       Dark Light Shape React APD   Right 2 1 Round Brisk None   Left 2 1 Round Brisk None         Visual Fields (Counting fingers)       Left Right    Full Full         Extraocular Movement       Right Left    Full, Ortho Full, Ortho         Neuro/Psych     Oriented x3: Yes   Mood/Affect: Normal         Dilation     Both eyes: 1.0% Mydriacyl, 2.5% Phenylephrine @ 2:06 PM            Slit Lamp and Fundus Exam     Slit Lamp Exam       Right Left   Lids/Lashes Dermato, mild MGD Dermato, mild MGD   Conjunctiva/Sclera Temporal pinguecula, mild inferior sub conj heme Temporal pinguecula   Cornea EBMD, mild haze, trace PEE, mild Debris in tear film, well healed cataract wound trace haze, trace PEE, well healed temporal cataract wounds, arcus   Anterior Chamber Deep and clear; narrow temporal angle Deep and clear   Iris Round and dilated, mild anterior bowing, No NVI Round and moderately dilated to 5.14mm   Lens PCIOL in good position, trace PCO PC IOL in good position with open PC   Anterior Vitreous Synerisis Synerisis         Fundus Exam       Right Left   Disc Mild pallor, sharp rim, +cupping, thin inferior rim Mild pallor, sharp rim, +cupping, +PPA   C/D Ratio 0.7 0.7   Macula Flat, good foveal reflex, cystic changes - slightly improved, RPE mottling and clumping, no heme, Drusen, no exudates -- resolved Flat, blunted foveal reflex, trace cystic changes, punctate MA- improved   Vessels attenuated, Tortuous attenuated, Tortuous   Periphery Attached, rare MA, focal DBH nasal to disc--improved Attached. No heme.  Refraction     Wearing Rx       Sphere Cylinder Axis Add   Right -0.25 +2.00 158 +2.50   Left -1.00 +1.25 008 +2.50           IMAGING AND PROCEDURES  Imaging and Procedures for 01/03/2024  OCT, Retina - OU - Both Eyes       Right Eye Quality was good. Central Foveal Thickness: 290. Progression has improved. Findings include no SRF, abnormal foveal contour, intraretinal fluid, vitreomacular adhesion (persistent IRF/IRHM -- slightly improved, partial PVD).   Left Eye Quality was good. Central Foveal Thickness: 288. Progression has improved. Findings include normal foveal contour, no IRF, no SRF, intraretinal hyper-reflective material (Trace persistent cystic changes and punctate IRHM inferior fovea-- slightly  improved; partial PVD).   Notes *Images captured and stored on drive  Diagnosis / Impression:  +DME OU OD: persistent IRF/IRHM -- slightly improved, partial PVD OS: Trace persistent cystic changes and punctate IRHM inferior fovea--slightly improved; partial PVD  Clinical management:  See below  Abbreviations: NFP - Normal foveal profile. CME - cystoid macular edema. PED - pigment epithelial detachment. IRF - intraretinal fluid. SRF - subretinal fluid. EZ - ellipsoid zone. ERM - epiretinal membrane. ORA - outer retinal atrophy. ORT - outer retinal tubulation. SRHM - subretinal hyper-reflective material. IRHM - intraretinal hyper-reflective material     Intravitreal Injection, Pharmacologic Agent - OD - Right Eye       Time Out 01/03/2024. 2:45 PM. Confirmed correct patient, procedure, site, and patient consented.   Anesthesia Topical anesthesia was used. Anesthetic medications included Lidocaine 2%, Proparacaine 0.5%.   Procedure Preparation included 5% betadine to ocular surface, eyelid speculum. A supplied (32g) needle was used.   Injection: 6 mg faricimab-svoa 6 MG/0.05ML (Patient supplied)   Route: Intravitreal, Site: Right Eye   NDC: 96295-284-13, Lot: [id number], Expiration date: 06/13/2025, Waste: 0 mL   Post-op Post injection exam found visual acuity of at least counting fingers. The patient tolerated the procedure well. There were no complications. The patient received written and verbal post procedure care education.   Notes **SAMPLE MEDICATION ADMINISTERED**      Intravitreal Injection, Pharmacologic Agent - OS - Left Eye       Time Out 01/03/2024. 2:45 PM. Confirmed correct patient, procedure, site, and patient consented.   Anesthesia Topical anesthesia was used. Anesthetic medications included Lidocaine 2%, Proparacaine 0.5%.   Procedure Preparation included 5% betadine to ocular surface, eyelid speculum. A (32g) needle was used.   Injection: 6 mg  faricimab-svoa 6 MG/0.05ML (Patient supplied)   Route: Intravitreal, Site: Left Eye   NDC: 25366-440-34, Lot: [id number], Expiration date: 06/13/2025, Waste: 0 mL   Post-op Post injection exam found visual acuity of at least counting fingers. The patient tolerated the procedure well. There were no complications. The patient received written and verbal post procedure care education. Post injection medications were not given.   Notes **SAMPLE MEDICATION ADMINISTERED**           ASSESSMENT/PLAN:   ICD-10-CM   1. 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, associated with type 2 diabetes mellitus (HCC)  E11.3213 OCT, Retina - OU - Both Eyes    Intravitreal Injection, Pharmacologic Agent - OD - Right Eye    Intravitreal Injection, Pharmacologic Agent - OS - Left Eye    faricimab-svoa (VABYSMO) 6mg /0.59mL intravitreal injection    faricimab-svoa (VABYSMO) 6mg /0.76mL intravitreal injection    2. Current use of insulin (HCC)  Z79.4  3. Long term (current) use of oral hypoglycemic drugs  Z79.84     4. Essential hypertension  I10     5. Hypertensive retinopathy of both eyes  H35.033     6. Pseudophakia of both eyes  Z96.1     7. 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 History of herpes zoster of eye  Z86.19     9. Primary open angle glaucoma of both eyes, unspecified glaucoma stage  H40.1130      1-3. 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 Single focal MA superior to fovea; OS: Perifoveal Mas w/ late leakage - s/p IVA OS # 1(07.26.21), #2 (08.24.21), #3 (09.21.21), #4 (10.22.21) -- IVA resistance ========================================================== - s/p IVE OD #1 (09.29.23), #2 (10.30.23), #3 (SAMPLE) (11.27.23) #4 (12.29.23) #5 (01.26.24) #6 (02.23.24) -- IVE resistance - s/p IVE OS #1 (11.22.21), #2 (12.21.21), #3 (01.20.22), #4 (02.18.22), #5 (03.18.22), #6  (04.15.22), #7 (05.10.22) -- sample, #8 (06.08.22), #9 (07.12.22), #10 (08.12.22), #11 (09.16.22), #12 (10.21.22), #13 (11.18.22), #14 (12.21.22), #15 (01.20.23), #16 (02.17.23), #17 (03.24.23), #18 (04.21.23), #19 (05.26.23), #20 (06.23.23) -- IVE resistance =========================================================== - s/p IVV OD #1 (03.22.24), #2 (04.19.24), #3 (05.17.24), #4 (06.18.24), #5 (07.19.24), #6 (08.23.24), #7 (09.27.24), #8 (11.01.24) #9(12.06.24), #10 (01.10.25 -- sample) #11(02.14.25) - s/p IVV OS #1 (07.21.23), #2 (08.25.23), #3 (09.29.23), #4 (10.30.23), #5 (11.27.23) #6 (12.29.23), #7 (01.26.23), #8 (02.23.24), #9 (03.22.24), #10 (04.19.24), #11 (05.17.24) #12 (12.06.24) #13 (02.14.25) - BCVA OD: 20/20 -- stable; OS 20/20 from 20/30 - OCT shows  OD: persistent IRF/IRHM -- slightly improved, partial PVD; OS: Trace persistent cystic changes and punctate IRHM inferior fovea--slightly improved; partial PVD at 5 wks - recommend IVV OD #12 and IVV OS #14 [both samples] today, 03.21 25 with f/u in 5 weeks [[employer] funding unavailable] - treating OS prn - pt wishes to proceed with injections OU  - RBA of procedure discussed, questions answered  - Vabysmo informed consent obtained and signed, 02.14.25 (OU) - Eylea approved until 10/04/23 - [employer] approved Vabysmo, no good days for 2025 as of now - see procedure note - f/u 5 weeks -- DFE/OCT, possible injection(s)  4,5. Hypertensive retinopathy OU - discussed importance of tight BP control - continue to monitor  6. Pseudophakia OU  - s/p CE/IOL OS (2020, Dr. Alben Spittle)             - s/p CE/IOL OD (2022, Dr. Alben Spittle)  - s/p YAG cap OS (04.04.23) - BCVA 20/30 OD and 20/25 OS from 20/150  - IOLs in good position, doing well  - continue to monitor  7. 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 Valere Dross in August 2022  - s/p SuperK OS on 12.12.2022  8. History of herpes zoster/iritis OS  - on Valtrex 1 g daily---maintenance  9.  POAG OU  - was under the expert management of Dr. Alben Spittle, now follows with Dr. Zenaida Niece  - IOP 20, 14  - continue Cosopt bid OU  Ophthalmic Meds Ordered this visit:  Meds ordered this encounter  Medications   faricimab-svoa (VABYSMO) 6mg /0.60mL intravitreal injection   faricimab-svoa (VABYSMO) 6mg /0.37mL intravitreal injection     This document serves as a record of services personally performed by Karie Chimera, MD, PhD. It was created on their behalf by Berlin Hun COT, an ophthalmic technician. The creation of this record is the provider's dictation and/or activities during the visit.  Electronically signed by: Berlin Hun COT 03.21.25  4:06 PM  This document serves as a record of services personally performed by Karie Chimera, MD, PhD. It was created on their behalf by Charlette Caffey, COT an ophthalmic technician. The creation of this record is the provider's dictation and/or activities during the visit.    Electronically signed by:  Charlette Caffey, COT  01/03/24 4:06 PM  Karie Chimera, M.D., Ph.D. Diseases & Surgery of the Retina and Vitreous [facility] 01/03/2024   I have reviewed the above documentation for accuracy and completeness, and I agree with the above. Karie Chimera, M.D., Ph.D. 01/03/24 4:07 PM   Abbreviations: M myopia (nearsighted); A astigmatism; H hyperopia (farsighted); P presbyopia; Mrx spectacle prescription;  CTL contact lenses; OD right eye; OS left eye; OU both eyes  XT exotropia; ET esotropia; PEK punctate epithelial keratitis; PEE punctate epithelial erosions; DES dry eye syndrome; MGD meibomian gland dysfunction; ATs artificial tears; PFAT's preservative free artificial tears; NSC nuclear sclerotic cataract; PSC posterior subcapsular cataract; ERM epi-retinal membrane; PVD posterior vitreous detachment; RD retinal detachment; DM diabetes mellitus; DR diabetic retinopathy; NPDR non-proliferative diabetic  retinopathy; PDR proliferative diabetic retinopathy; CSME clinically significant macular edema; DME diabetic macular edema; dbh dot blot hemorrhages; CWS cotton wool spot; POAG primary open angle glaucoma; C/D cup-to-disc ratio; HVF humphrey visual field; GVF goldmann visual field; OCT optical coherence tomography; IOP intraocular pressure; BRVO Branch retinal vein occlusion; CRVO central retinal vein occlusion; CRAO central retinal artery occlusion; BRAO branch retinal artery occlusion; RT retinal tear; SB scleral buckle; PPV pars plana vitrectomy; VH Vitreous hemorrhage; PRP panretinal laser photocoagulation; IVK intravitreal kenalog; VMT vitreomacular traction; MH Macular hole;  NVD neovascularization of the disc; NVE neovascularization elsewhere; AREDS age related eye disease study; ARMD age related macular degeneration; POAG primary open angle glaucoma; EBMD epithelial/anterior basement membrane dystrophy; ACIOL anterior chamber intraocular lens; IOL intraocular lens; PCIOL posterior chamber intraocular lens; Phaco/IOL phacoemulsification with intraocular lens placement; PRK photorefractive keratectomy; LASIK laser assisted in situ keratomileusis; HTN hypertension; DM diabetes mellitus; COPD chronic obstructive pulmonary disease

## 2024-01-01 NOTE — Telephone Encounter (Signed)
 Spoke with Patient advised him of Dr. Diona Browner response to see if he could be seen in the office to speak with him regarding his symptoms. Advised him will send to scheduling to see when we can get him

## 2024-01-01 NOTE — Telephone Encounter (Signed)
 Sent Lucas Dudley a message and, he approved another add on for 3/20 at 11:20 in [place]. Spoke with pt so he aware.

## 2024-01-02 ENCOUNTER — Encounter: Payer: Self-pay | Admitting: Cardiology

## 2024-01-02 ENCOUNTER — Other Ambulatory Visit: Payer: Self-pay | Admitting: Cardiology

## 2024-01-02 ENCOUNTER — Ambulatory Visit: Attending: Cardiology | Admitting: Cardiology

## 2024-01-02 ENCOUNTER — Ambulatory Visit: Admitting: Cardiology

## 2024-01-02 ENCOUNTER — Ambulatory Visit (HOSPITAL_COMMUNITY)
Admission: RE | Admit: 2024-01-02 | Discharge: 2024-01-02 | Disposition: A | Discharge status: Home / Self Care | Source: Ambulatory Visit | Attending: Internal Medicine | Admitting: Internal Medicine

## 2024-01-02 VITALS — BP 140/80 | HR 83 | Ht 70.5 in | Wt 250.2 lb

## 2024-01-02 DIAGNOSIS — I25119 Atherosclerotic heart disease of native coronary artery with unspecified angina pectoris: Secondary | ICD-10-CM

## 2024-01-02 DIAGNOSIS — I5032 Chronic diastolic (congestive) heart failure: Secondary | ICD-10-CM

## 2024-01-02 DIAGNOSIS — Z8719 Personal history of other diseases of the digestive system: Secondary | ICD-10-CM

## 2024-01-02 DIAGNOSIS — J209 Acute bronchitis, unspecified: Secondary | ICD-10-CM | POA: Diagnosis present

## 2024-01-02 DIAGNOSIS — I272 Pulmonary hypertension, unspecified: Secondary | ICD-10-CM | POA: Diagnosis not present

## 2024-01-02 DIAGNOSIS — Z01810 Encounter for preprocedural cardiovascular examination: Secondary | ICD-10-CM

## 2024-01-02 DIAGNOSIS — I4819 Other persistent atrial fibrillation: Secondary | ICD-10-CM

## 2024-01-02 DIAGNOSIS — I2510 Atherosclerotic heart disease of native coronary artery without angina pectoris: Secondary | ICD-10-CM

## 2024-01-02 LAB — CMP14+EGFR
ALT: 8 IU/L (ref 0–44)
AST: 16 IU/L (ref 0–40)
Albumin: 4.2 g/dL (ref 3.7–4.7)
Alkaline Phosphatase: 77 IU/L (ref 44–121)
BUN/Creatinine Ratio: 14 (ref 10–24)
BUN: 24 mg/dL (ref 8–27)
Bilirubin Total: 0.3 mg/dL (ref 0.0–1.2)
CO2: 27 mmol/L (ref 20–29)
Calcium: 9.4 mg/dL (ref 8.6–10.2)
Chloride: 95 mmol/L — ABNORMAL LOW (ref 96–106)
Creatinine, Ser: 1.71 mg/dL — ABNORMAL HIGH (ref 0.76–1.27)
Globulin, Total: 3.1 g/dL (ref 1.5–4.5)
Glucose: 153 mg/dL — ABNORMAL HIGH (ref 70–99)
Potassium: 4.6 mmol/L (ref 3.5–5.2)
Sodium: 140 mmol/L (ref 134–144)
Total Protein: 7.3 g/dL (ref 6.0–8.5)
eGFR: 40 mL/min/{1.73_m2} — ABNORMAL LOW (ref >59)

## 2024-01-02 LAB — BRAIN NATRIURETIC PEPTIDE: BNP: 261 pg/mL — ABNORMAL HIGH (ref 0.0–100.0)

## 2024-01-02 LAB — CBC WITH DIFFERENTIAL/PLATELET
Basophils Absolute: 0.1 10*3/uL (ref 0.0–0.2)
Basos: 1 %
EOS (ABSOLUTE): 0.3 10*3/uL (ref 0.0–0.4)
Eos: 4 %
Hematocrit: 37.4 % — ABNORMAL LOW (ref 37.5–51.0)
Hemoglobin: 11.7 g/dL — ABNORMAL LOW (ref 13.0–17.7)
Immature Grans (Abs): 0 10*3/uL (ref 0.0–0.1)
Immature Granulocytes: 1 %
Lymphocytes Absolute: 0.9 10*3/uL (ref 0.7–3.1)
Lymphs: 10 %
MCH: 29 pg (ref 26.6–33.0)
MCHC: 31.3 g/dL — ABNORMAL LOW (ref 31.5–35.7)
MCV: 93 fL (ref 79–97)
Monocytes Absolute: 0.6 10*3/uL (ref 0.1–0.9)
Monocytes: 7 %
Neutrophils Absolute: 6.6 10*3/uL (ref 1.4–7.0)
Neutrophils: 77 %
Platelets: 465 10*3/uL — ABNORMAL HIGH (ref 150–450)
RBC: 4.04 x10E6/uL — ABNORMAL LOW (ref 4.14–5.80)
RDW: 18.8 % — ABNORMAL HIGH (ref 11.6–15.4)
WBC: 8.4 10*3/uL (ref 3.4–10.8)

## 2024-01-02 LAB — MAGNESIUM: Magnesium: 1.7 mg/dL (ref 1.6–2.3)

## 2024-01-02 MED ORDER — HYDRALAZINE HCL 10 MG PO TABS: 20.0000 mg | ORAL_TABLET | Freq: Three times a day (TID) | ORAL | 3 refills | Status: DC

## 2024-01-02 NOTE — Patient Instructions (Signed)
 20    Your procedure is scheduled on: 01/07/2024  Report to [facility] Main Entrance at 11:45    AM.  Call this number if you have problems the morning of surgery: [phone]   Remember:   Follow instructions on letter from office regarding when to stop eating and drinking        No Smoking the day of procedure   No diabetic medication am of procedure  Use inhalers if needed    Take these medicines the morning of surgery with A SIP OF WATER: Pantoprazole  Take only 1/2 dose of insulin the night before procedure   Do not wear jewelry, make-up or nail polish.  Do not wear lotions, powders, or perfumes. You may wear deodorant.                Do not bring valuables to the hospital.  Contacts, dentures or bridgework may not be worn into surgery.  Leave suitcase in the car. After surgery it may be brought to your room.  For patients admitted to the hospital, checkout time is 11:00 AM the day of discharge.   Patients discharged the day of surgery will not be allowed to drive home. Upper Endoscopy, Adult Upper endoscopy is a procedure to look inside the upper GI (gastrointestinal) tract. The upper GI tract is made up of: The part of the body that moves food from your mouth to your stomach (esophagus). The stomach. The first part of your small intestine (duodenum). This procedure is also called esophagogastroduodenoscopy (EGD) or gastroscopy. In this procedure, your health care provider passes a thin, flexible tube (endoscope) through your mouth and down your esophagus into your stomach. A small camera is attached to the end of the tube. Images from the camera appear on a monitor in the exam room. During this procedure, your health care provider may also remove a small piece of tissue to be sent to a lab and examined under a microscope (biopsy). Your health care provider may do an upper endoscopy to diagnose cancers of the upper GI tract. You may also have this procedure to find the cause of  other conditions, such as: Stomach pain. Heartburn. Pain or problems when swallowing. Nausea and vomiting. Stomach bleeding. Stomach ulcers. Tell a health care provider about: Any allergies you have. All medicines you are taking, including vitamins, herbs, eye drops, creams, and over-the-counter medicines. Any problems you or family members have had with anesthetic medicines. Any blood disorders you have. Any surgeries you have had. Any medical conditions you have. Whether you are pregnant or may be pregnant. What are the risks? Generally, this is a safe procedure. However, problems may occur, including: Infection. Bleeding. Allergic reactions to medicines. A tear or hole (perforation) in the esophagus, stomach, or duodenum. What happens before the procedure? Staying hydrated Follow instructions from your health care provider about hydration, which may include: Up to 4 hours before the procedure - you may continue to drink clear liquids, such as water, clear fruit juice, black coffee, and plain tea.   Medicines Ask your health care provider about: Changing or stopping your regular medicines. This is especially important if you are taking diabetes medicines or blood thinners. Taking medicines such as aspirin and ibuprofen. These medicines can thin your blood. Do not take these medicines unless your health care provider tells you to take them. Taking over-the-counter medicines, vitamins, herbs, and supplements. General instructions Plan to have someone take you home from the hospital or clinic. If  you will be going home right after the procedure, plan to have someone with you for 24 hours. Ask your health care provider what steps will be taken to help prevent infection. What happens during the procedure?  An IV will be inserted into one of your veins. You may be given one or more of the following: A medicine to help you relax (sedative). A medicine to numb the throat (local  anesthetic). You will lie on your left side on an exam table. Your health care provider will pass the endoscope through your mouth and down your esophagus. Your health care provider will use the scope to check the inside of your esophagus, stomach, and duodenum. Biopsies may be taken. The endoscope will be removed. The procedure may vary among health care providers and hospitals. What happens after the procedure? Your blood pressure, heart rate, breathing rate, and blood oxygen level will be monitored until you leave the hospital or clinic. Do not drive for 24 hours if you were given a sedative during your procedure. When your throat is no longer numb, you may be given some fluids to drink. It is up to you to get the results of your procedure. Ask your health care provider, or the department that is doing the procedure, when your results will be ready. Summary Upper endoscopy is a procedure to look inside the upper GI tract. During the procedure, an IV will be inserted into one of your veins. You may be given a medicine to help you relax. A medicine will be used to numb your throat. The endoscope will be passed through your mouth and down your esophagus. This information is not intended to replace advice given to you by your health care provider. Make sure you discuss any questions you have with your health care provider. Document Revised: 03/26/2018 Document Reviewed: 03/03/2018 Elsevier Patient Education  2020 [employer]vier Inc.                                                                                                                                      EndoscopyCare After  Please read the instructions outlined below and refer to this sheet in the next few weeks. These discharge instructions provide you with general information on caring for yourself after you leave the hospital. Your doctor may also give you specific instructions. While your treatment has been planned according to the  most current medical practices available, unavoidable complications occasionally occur. If you have any problems or questions after discharge, please call your doctor. HOME CARE INSTRUCTIONS Activity You may resume your regular activity but move at a slower pace for the next 24 hours.  Take frequent rest periods for the next 24 hours.  Walking will help expel (get rid of) the air and reduce the bloated feeling in your abdomen.  No driving for 24 hours (because of the anesthesia (medicine) used during the test).  You may shower.  Do not sign any important legal documents or operate any machinery for 24 hours (because of the anesthesia used during the test).  Nutrition Drink plenty of fluids.  You may resume your normal diet.  Begin with a light meal and progress to your normal diet.  Avoid alcoholic beverages for 24 hours or as instructed by your caregiver.  Medications You may resume your normal medications unless your caregiver tells you otherwise. What you can expect today You may experience abdominal discomfort such as a feeling of fullness or "gas" pains.  You may experience a sore throat for 2 to 3 days. This is normal. Gargling with salt water may help this.  Follow-up Your doctor will discuss the results of your test with you. SEEK IMMEDIATE MEDICAL CARE IF: You have excessive nausea (feeling sick to your stomach) and/or vomiting.  You have severe abdominal pain and distention (swelling).  You have trouble swallowing.  You have a temperature over 100 F (37.8 C).  You have rectal bleeding or vomiting of blood.  Document Released: 05/15/2004 Document Revised: 09/20/2011 Document Reviewed: 11/26/2007

## 2024-01-02 NOTE — Progress Notes (Signed)
 Cardiology Office Note  Date: 01/02/2024   ID: Latrelle, Bazar [birth year], MRN [id number]  History of Present Illness: Lucas Dudley is an 83 y.o. male last seen in February.  He is here today with his daughter for a follow-up visit.  He was hospitalized in early March with progressive fluid retention and weight gain despite prior outpatient adjustments in his diuretic regimen.  He was treated with IV Lasix, taken off lisinopril and started on hydralazine otherwise.  Follow-up echocardiogram revealed LVEF 55 to 60%, also mild RV dysfunction with evidence of severe pulmonary hypertension, PASP 62 mmHg.  We discussed his interval testing and continued symptoms.  We also went over his medications.  We previously stopped Coreg given concerns for symptomatic bradycardia and conduction system disease.  Heart rate is better today although exertional symptoms have not changed.  Reports weights in the 240-250 range at home.  No exertional chest pain but NYHA class III dyspnea on exertion, also bendopnea.  He is scheduled for EGD with push enteroscopy next week with Dr. Levon Hedger for further evaluation of iron deficiency anemia.  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  Recent lab work shows creatinine 1.71, hemoglobin 11.7.  Physical Exam: VS:  BP (!) 140/80 (BP Location: Left Arm, Cuff Size: Normal)   Pulse 83   Ht 5' 10.5" (1.791 m)   Wt 250 lb 3.2 oz (113.5 kg)   SpO2 93%   BMI 35.39 kg/m , BMI Body mass index is 35.39 kg/m.  Wt Readings from Last 3 Encounters:  01/02/24 250 lb 3.2 oz (113.5 kg)  12/31/23 249 lb 3.2 oz (113 kg)  12/19/23 245 lb (111.1 kg)    General: Patient appears comfortable at rest. HEENT: Conjunctiva and lids normal. Neck: Supple, no elevated JVP or carotid bruits. Lungs: Clear to auscultation, nonlabored breathing at rest. Cardiac: Regular rate and rhythm, no S3, 1/6 systolic murmur. Abdomen: Protuberant, bowel  sounds present. Extremities: Chronic lower leg edema, venous stasis. Skin: Warm and dry. Musculoskeletal: No kyphosis. Neuropsychiatric: Alert and oriented x3, affect grossly appropriate.  ECG:  An ECG dated 12/14/2023 was personally reviewed today and demonstrated:  Sinus rhythm with prolonged PR interval, right bundle branch block, left anterior fascicular block.  Labwork: 12/30/2023: ALT 8; AST 16; BNP 261.0; BUN 24; Creatinine, Ser 1.71; Hemoglobin 11.7; Magnesium 1.7; Platelets 465; Potassium 4.6; Sodium 140     Component Value Date/Time   CHOL 147 07/22/2023 1034   TRIG 213 (H) 07/22/2023 1034   HDL 24 (L) 07/22/2023 1034   CHOLHDL 6.1 (H) 07/22/2023 1034   LDLCALC 87 07/22/2023 1034   Other Studies Reviewed Today:  Echocardiogram 12/15/2023: 1. Left ventricular ejection fraction, by estimation, is 55 to 60%. Left  ventricular ejection fraction by PLAX is 59 %. The left ventricle has  normal function. The left ventricle has no regional wall motion  abnormalities. There is moderate concentric  left ventricular hypertrophy. Left ventricular diastolic parameters are  indeterminate.   2. Right ventricular systolic function is mildly reduced. The right  ventricular size is normal. There is severely elevated pulmonary artery  systolic pressure. The estimated right ventricular systolic pressure is  62.6 mmHg.   3. The mitral valve is grossly normal. No evidence of mitral valve  regurgitation.   4. The aortic valve is tricuspid. Aortic valve regurgitation is not  visualized. Aortic valve sclerosis/calcification is present, without any  evidence of aortic stenosis.   5. The inferior  vena cava is dilated in size with <50% respiratory  variability, suggesting right atrial pressure of 15 mmHg.   Assessment and Plan:  1.  Dyspnea on exertion and fatigue, likely multifactorial and at this point NYHA class III.  Not clearly related to symptomatic bradycardia, heart rate is better since we  stopped Coreg although he does have underlying conduction system disease.  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  CAD was documented in 2016 at cardiac catheterization and could certainly have progressed since that time as well.  Today we discussed further testing including the risks and benefits of a diagnostic right catheterization with coronary angiography (no LV gram).  Does have risk of contrast nephropathy but recent creatinine 1.7 and he is now off ACE inhibitor.  Patient is in agreement to proceed and per discussion today this will be scheduled for the first week in April, repeat CBC and BMET prior to procedure.  Continue Demadex 40 mg in the morning and 20 mg in the evening, KCl 10 mEq daily.  Might ultimately be able to add SGLT2 inhibitor but will get more objective information first and reevaluate renal function after angiography.   2.  CAD with occluded ostial first diagonal and otherwise moderate LAD and PDA disease managed medically.  As per above discussion plan will be for repeat coronary angiography to reevaluate coronary anatomy in light of his symptoms.  He is to undergo EGD with push enteroscopy next week as part of workup for iron deficiency anemia.  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  This may have some impact on choice of revascularization if necessary.  Currently on aspirin 81 mg daily and Lopid 600 mg daily.   3.  Persistent atrial fibrillation with CHA2DS2-VASc score of 5 status post Watchman implantation by Dr. Excell Seltzer.  He is not on anticoagulation.  Continue amiodarone 200 mg daily.   4.  Primary hypertension.  Increase hydralazine to 20 mg 3 times a day.   5.  Mixed hyperlipidemia.  He has a history of statin intolerance, currently on Lopid 600 mg  daily.  Disposition:  Follow up  after procedure.  Signed, Jonelle Sidle, M.D., F.A.C.C. [facility] at [facility]

## 2024-01-02 NOTE — Patient Instructions (Signed)
 Medication Instructions:  Hydralazine 20 mg Three times daily.   *If you need a refill on your cardiac medications before your next appointment, please call your pharmacy*   Lab Work: Your physician recommends that you return for lab work the end of next week. ( CBC, BMET)   If you have labs (blood work) drawn today and your tests are completely normal, you will receive your results only by: MyChart Message (if you have MyChart) OR A paper copy in the mail If you have any lab test that is abnormal or we need to change your treatment, we will call you to review the results.   Testing/Procedures: Your physician has requested that you have a cardiac catheterization. Cardiac catheterization is used to diagnose and/or treat various heart conditions. Doctors may recommend this procedure for a number of different reasons. The most common reason is to evaluate chest pain. Chest pain can be a symptom of coronary artery disease (CAD), and cardiac catheterization can show whether plaque is narrowing or blocking your heart's arteries. This procedure is also used to evaluate the valves, as well as measure the blood flow and oxygen levels in different parts of your heart. For further information please visit [email or web address] Please follow instruction sheet, as given.    Follow-Up: At [facility], you and your health needs are our priority.  As part of our continuing mission to provide you with exceptional heart care, we have created designated Provider Care Teams.  These Care Teams include your primary Cardiologist (physician) and Advanced Practice Providers (APPs -  Physician Assistants and Nurse Practitioners) who all work together to provide you with the care you need, when you need it.  We recommend signing up for the patient portal called "MyChart".  Sign up information is provided on this After Visit Summary.  MyChart is used to connect with patients for Virtual Visits (Telemedicine).   Patients are able to view lab/test results, encounter notes, upcoming appointments, etc.  Non-urgent messages can be sent to your provider as well.   To learn more about what you can do with MyChart, go to [email or web address].    Your next appointment:   1 month(s)  Provider:   You may see Nona Dell, MD or one of the following Advanced Practice Providers on your designated Care Team:   Randall An, PA-C  Jacolyn Reedy, PA-C     Other Instructions  [facility] A DEPT OF [facility] [address] [place] [address] Dept: [phone] Loc: [phone]  Lucas Dudley  01/02/2024  You are scheduled for a Cardiac Catheterization on Tuesday, April 1 with Dr. Tonny Bollman.  1. Please arrive at the [facility] (Main Entrance A) at [facility]: [address] [place], [place] [address] at 5:30 AM (This time is 2 hour(s) before your procedure to ensure your preparation).   Free valet parking service is available. You will check in at ADMITTING. The support person will be asked to wait in the waiting room.  It is OK to have someone drop you off and come back when you are ready to be discharged.    Special note: Every effort is made to have your procedure done on time. Please understand that emergencies sometimes delay scheduled procedures.  2. Diet: Do not eat solid foods after midnight.  The patient may have clear liquids until 5am upon the day of the procedure.  3. Labs: You will need  to have blood drawn on Friday, March 28 at [facility]. You do not need to be fasting.  4. Medication instructions in preparation for your procedure:   Contrast Allergy: No   Current Outpatient Medications (Endocrine & Metabolic):    glimepiride (AMARYL) 2 MG tablet, Take 1 tablet (2 mg total) by mouth 2 (two) times daily with a meal. (Patient taking differently: Take 2 mg by mouth daily with  breakfast.)   Insulin Glargine (BASAGLAR KWIKPEN) 100 UNIT/ML, Inject 45 Units into the skin at bedtime. (Patient taking differently: Inject 38 Units into the skin at bedtime.)   metFORMIN (GLUCOPHAGE-XR) 500 MG 24 hr tablet, Take 1 tablet (500 mg total) by mouth 2 (two) times daily with a meal.  Current Outpatient Medications (Cardiovascular):    amiodarone (PACERONE) 200 MG tablet, TAKE 1 TABLET BY MOUTH EVERY DAY (Patient taking differently: Take 200 mg by mouth at bedtime.)   gemfibrozil (LOPID) 600 MG tablet, Take 600 mg by mouth at bedtime.    hydrALAZINE (APRESOLINE) 10 MG tablet, Take 1 tablet (10 mg total) by mouth 3 (three) times daily.   torsemide (DEMADEX) 20 MG tablet, 40 Mg in the morning and 20 Mg in the evening   lisinopril (ZESTRIL) 40 MG tablet, Take 1 tablet (40 mg total) by mouth daily. (Patient not taking: Reported on 01/02/2024)  Current Outpatient Medications (Respiratory):    albuterol (VENTOLIN HFA) 108 (90 Base) MCG/ACT inhaler, Inhale 2 puffs into the lungs every 6 (six) hours as needed for wheezing or shortness of breath.   fluticasone (FLONASE) 50 MCG/ACT nasal spray, SPRAY 2 SPRAYS INTO EACH NOSTRIL EVERY DAY  Current Outpatient Medications (Analgesics):    acetaminophen (TYLENOL) 650 MG CR tablet, Take 1,300 mg by mouth at bedtime.   aspirin 81 MG chewable tablet, Chew 81 mg by mouth daily.  Current Outpatient Medications (Hematological):    ferrous sulfate 325 (65 FE) MG tablet, Take 1 tablet (325 mg total) by mouth daily with breakfast.  Current Outpatient Medications (Other):    ammonium lactate (AMLACTIN DAILY) 12 % lotion, Apply 1 Application topically as needed.   amoxicillin-clavulanate (AUGMENTIN) 875-125 MG tablet, Take 1 tablet by mouth 2 (two) times daily.   Artificial Tear Ointment (DRY EYES OP), Place 1 drop into both eyes daily as needed (Dry eye).   camphor-menthol (ANTI-ITCH) lotion, Apply 1 Application topically daily as needed for itching.    COMBIGAN 0.2-0.5 % ophthalmic solution, Place 1 drop into both eyes 2 (two) times daily.   ELDERBERRY PO, Take 2 tablets by mouth daily. Chewable   magnesium gluconate (MAGONATE) 500 MG tablet, Take 500 mg by mouth daily.   mineral oil liquid, Take 15 mLs by mouth daily as needed for moderate constipation.   OVER THE COUNTER MEDICATION, Take 1 tablet by mouth daily. Beets Chew   pantoprazole (PROTONIX) 40 MG tablet, TAKE 1 TABLET BY MOUTH EVERY DAY   potassium chloride (KLOR-CON) 10 MEQ tablet, Take 1 tablet (10 mEq total) by mouth daily.   zinc gluconate 50 MG tablet, Take 50 mg by mouth daily. *For reference purposes while preparing patient instructions.   Delete this med list prior to printing instructions for patient.*  Take only 1/2 Insulin the night before your procedure. And none the morning of your procedure.   Do not take Diabetes Med Glucophage (Metformin) on the day of the procedure and HOLD 48 HOURS AFTER THE PROCEDURE.  On the morning of your procedure, take your Aspirin 81 mg and  any morning medicines NOT listed above.  You may use sips of water.  5. Plan to go home the same day, you will only stay overnight if medically necessary. 6. Bring a current list of your medications and current insurance cards. 7. You MUST have a responsible person to drive you home. 8. Someone MUST be with you the first 24 hours after you arrive home or your discharge will be delayed. 9. Please wear clothes that are easy to get on and off and wear slip-on shoes.  Thank you for allowing [place] to care for you!   -- [facility]

## 2024-01-03 ENCOUNTER — Telehealth: Payer: Self-pay | Admitting: Cardiology

## 2024-01-03 ENCOUNTER — Encounter (HOSPITAL_COMMUNITY)
Admission: RE | Admit: 2024-01-03 | Discharge: 2024-01-03 | Disposition: A | Discharge status: Home / Self Care | Payer: Medicare Other | Source: Ambulatory Visit | Attending: Gastroenterology | Admitting: Gastroenterology

## 2024-01-03 ENCOUNTER — Encounter (INDEPENDENT_AMBULATORY_CARE_PROVIDER_SITE_OTHER): Payer: Self-pay | Admitting: Ophthalmology

## 2024-01-03 ENCOUNTER — Ambulatory Visit (INDEPENDENT_AMBULATORY_CARE_PROVIDER_SITE_OTHER): Payer: Medicare Other | Admitting: Ophthalmology

## 2024-01-03 DIAGNOSIS — H401130 Primary open-angle glaucoma, bilateral, stage unspecified: Secondary | ICD-10-CM

## 2024-01-03 DIAGNOSIS — E113213 Type 2 diabetes mellitus with mild nonproliferative diabetic retinopathy with macular edema, bilateral: Secondary | ICD-10-CM | POA: Diagnosis not present

## 2024-01-03 DIAGNOSIS — Z8619 Personal history of other infectious and parasitic diseases: Secondary | ICD-10-CM

## 2024-01-03 DIAGNOSIS — Z961 Presence of intraocular lens: Secondary | ICD-10-CM

## 2024-01-03 DIAGNOSIS — H35033 Hypertensive retinopathy, bilateral: Secondary | ICD-10-CM

## 2024-01-03 DIAGNOSIS — I10 Essential (primary) hypertension: Secondary | ICD-10-CM

## 2024-01-03 DIAGNOSIS — Z7984 Long term (current) use of oral hypoglycemic drugs: Secondary | ICD-10-CM | POA: Diagnosis not present

## 2024-01-03 DIAGNOSIS — Z794 Long term (current) use of insulin: Secondary | ICD-10-CM | POA: Diagnosis not present

## 2024-01-03 DIAGNOSIS — H18523 Epithelial (juvenile) corneal dystrophy, bilateral: Secondary | ICD-10-CM

## 2024-01-03 MED ORDER — FARICIMAB-SVOA 6 MG/0.05ML IZ SOSY
6.0000 mg | PREFILLED_SYRINGE | INTRAVITREAL | Status: AC | PRN
  Administered 2024-01-03: 6 mg via INTRAVITREAL

## 2024-01-03 NOTE — Telephone Encounter (Signed)
 Checking percert on the following patient for   RIGHT/LEFT HEART CATH AND CORONARY ANGIOGRAPHY   01/14/2024   Dr. Excell Seltzer

## 2024-01-06 ENCOUNTER — Ambulatory Visit: Payer: Medicare Other | Admitting: Physician Assistant

## 2024-01-06 ENCOUNTER — Other Ambulatory Visit (HOSPITAL_COMMUNITY): Payer: Self-pay | Admitting: Cardiology

## 2024-01-06 ENCOUNTER — Inpatient Hospital Stay: Payer: Medicare Other | Attending: Hematology

## 2024-01-06 DIAGNOSIS — D500 Iron deficiency anemia secondary to blood loss (chronic): Secondary | ICD-10-CM

## 2024-01-06 DIAGNOSIS — E538 Deficiency of other specified B group vitamins: Secondary | ICD-10-CM | POA: Insufficient documentation

## 2024-01-06 DIAGNOSIS — I4891 Unspecified atrial fibrillation: Secondary | ICD-10-CM | POA: Insufficient documentation

## 2024-01-06 DIAGNOSIS — K649 Unspecified hemorrhoids: Secondary | ICD-10-CM | POA: Insufficient documentation

## 2024-01-06 DIAGNOSIS — G4733 Obstructive sleep apnea (adult) (pediatric): Secondary | ICD-10-CM | POA: Insufficient documentation

## 2024-01-06 DIAGNOSIS — R059 Cough, unspecified: Secondary | ICD-10-CM | POA: Diagnosis not present

## 2024-01-06 DIAGNOSIS — E1122 Type 2 diabetes mellitus with diabetic chronic kidney disease: Secondary | ICD-10-CM | POA: Insufficient documentation

## 2024-01-06 DIAGNOSIS — R609 Edema, unspecified: Secondary | ICD-10-CM | POA: Diagnosis not present

## 2024-01-06 DIAGNOSIS — Z881 Allergy status to other antibiotic agents status: Secondary | ICD-10-CM | POA: Insufficient documentation

## 2024-01-06 DIAGNOSIS — D649 Anemia, unspecified: Secondary | ICD-10-CM | POA: Insufficient documentation

## 2024-01-06 DIAGNOSIS — R0602 Shortness of breath: Secondary | ICD-10-CM | POA: Insufficient documentation

## 2024-01-06 DIAGNOSIS — I5032 Chronic diastolic (congestive) heart failure: Secondary | ICD-10-CM | POA: Diagnosis not present

## 2024-01-06 DIAGNOSIS — E611 Iron deficiency: Secondary | ICD-10-CM | POA: Insufficient documentation

## 2024-01-06 DIAGNOSIS — Z823 Family history of stroke: Secondary | ICD-10-CM | POA: Insufficient documentation

## 2024-01-06 DIAGNOSIS — I2510 Atherosclerotic heart disease of native coronary artery without angina pectoris: Secondary | ICD-10-CM | POA: Diagnosis not present

## 2024-01-06 DIAGNOSIS — Z79899 Other long term (current) drug therapy: Secondary | ICD-10-CM | POA: Diagnosis not present

## 2024-01-06 DIAGNOSIS — E785 Hyperlipidemia, unspecified: Secondary | ICD-10-CM | POA: Diagnosis not present

## 2024-01-06 DIAGNOSIS — Z8546 Personal history of malignant neoplasm of prostate: Secondary | ICD-10-CM | POA: Insufficient documentation

## 2024-01-06 DIAGNOSIS — K5900 Constipation, unspecified: Secondary | ICD-10-CM | POA: Diagnosis not present

## 2024-01-06 DIAGNOSIS — F5089 Other specified eating disorder: Secondary | ICD-10-CM | POA: Diagnosis not present

## 2024-01-06 DIAGNOSIS — R079 Chest pain, unspecified: Secondary | ICD-10-CM | POA: Insufficient documentation

## 2024-01-06 DIAGNOSIS — R0902 Hypoxemia: Secondary | ICD-10-CM | POA: Diagnosis not present

## 2024-01-06 DIAGNOSIS — Z800 Family history of malignant neoplasm of digestive organs: Secondary | ICD-10-CM | POA: Insufficient documentation

## 2024-01-06 DIAGNOSIS — D509 Iron deficiency anemia, unspecified: Secondary | ICD-10-CM | POA: Diagnosis present

## 2024-01-06 DIAGNOSIS — Z9049 Acquired absence of other specified parts of digestive tract: Secondary | ICD-10-CM | POA: Insufficient documentation

## 2024-01-06 DIAGNOSIS — N1831 Chronic kidney disease, stage 3a: Secondary | ICD-10-CM | POA: Diagnosis not present

## 2024-01-06 DIAGNOSIS — Z87891 Personal history of nicotine dependence: Secondary | ICD-10-CM | POA: Insufficient documentation

## 2024-01-06 DIAGNOSIS — Z801 Family history of malignant neoplasm of trachea, bronchus and lung: Secondary | ICD-10-CM | POA: Insufficient documentation

## 2024-01-06 DIAGNOSIS — Z9079 Acquired absence of other genital organ(s): Secondary | ICD-10-CM | POA: Insufficient documentation

## 2024-01-06 DIAGNOSIS — Z8249 Family history of ischemic heart disease and other diseases of the circulatory system: Secondary | ICD-10-CM | POA: Insufficient documentation

## 2024-01-06 DIAGNOSIS — Z833 Family history of diabetes mellitus: Secondary | ICD-10-CM | POA: Insufficient documentation

## 2024-01-06 DIAGNOSIS — I130 Hypertensive heart and chronic kidney disease with heart failure and stage 1 through stage 4 chronic kidney disease, or unspecified chronic kidney disease: Secondary | ICD-10-CM | POA: Diagnosis not present

## 2024-01-06 LAB — SAMPLE TO BLOOD BANK

## 2024-01-06 LAB — CBC WITH DIFFERENTIAL/PLATELET
Abs Immature Granulocytes: 0.04 10*3/uL (ref 0.00–0.07)
Basophils Absolute: 0.1 10*3/uL (ref 0.0–0.1)
Basophils Relative: 1 %
Eosinophils Absolute: 0.2 10*3/uL (ref 0.0–0.5)
Eosinophils Relative: 3 %
HCT: 32.9 % — ABNORMAL LOW (ref 39.0–52.0)
Hemoglobin: 9.9 g/dL — ABNORMAL LOW (ref 13.0–17.0)
Immature Granulocytes: 1 %
Lymphocytes Relative: 17 %
Lymphs Abs: 1.2 10*3/uL (ref 0.7–4.0)
MCH: 29.6 pg (ref 26.0–34.0)
MCHC: 30.1 g/dL (ref 30.0–36.0)
MCV: 98.5 fL (ref 80.0–100.0)
Monocytes Absolute: 0.5 10*3/uL (ref 0.1–1.0)
Monocytes Relative: 7 %
Neutro Abs: 5 10*3/uL (ref 1.7–7.7)
Neutrophils Relative %: 71 %
Platelets: 278 10*3/uL (ref 150–400)
RBC: 3.34 MIL/uL — ABNORMAL LOW (ref 4.22–5.81)
RDW: 19.7 % — ABNORMAL HIGH (ref 11.5–15.5)
WBC: 7 10*3/uL (ref 4.0–10.5)
nRBC: 0 % (ref 0.0–0.2)

## 2024-01-06 LAB — FERRITIN: Ferritin: 77 ng/mL (ref 24–336)

## 2024-01-06 LAB — IRON AND TIBC
Iron: 69 ug/dL (ref 45–182)
Saturation Ratios: 19 % (ref 17.9–39.5)
TIBC: 371 ug/dL (ref 250–450)
UIBC: 302 ug/dL

## 2024-01-07 ENCOUNTER — Encounter (HOSPITAL_COMMUNITY)
Admission: RE | Disposition: A | Discharge status: Home / Self Care | Payer: Self-pay | Source: Home / Self Care | Attending: Gastroenterology

## 2024-01-07 ENCOUNTER — Ambulatory Visit (HOSPITAL_COMMUNITY): Admitting: Anesthesiology

## 2024-01-07 ENCOUNTER — Ambulatory Visit: Admitting: Cardiovascular Disease

## 2024-01-07 ENCOUNTER — Ambulatory Visit: Payer: Self-pay | Admitting: Internal Medicine

## 2024-01-07 ENCOUNTER — Other Ambulatory Visit: Payer: Self-pay | Admitting: Gastroenterology

## 2024-01-07 ENCOUNTER — Ambulatory Visit (HOSPITAL_COMMUNITY)
Admission: RE | Admit: 2024-01-07 | Discharge: 2024-01-07 | Disposition: A | Discharge status: Home / Self Care | Payer: Medicare Other | Attending: Gastroenterology | Admitting: Gastroenterology

## 2024-01-07 ENCOUNTER — Encounter (HOSPITAL_COMMUNITY): Payer: Self-pay | Admitting: Gastroenterology

## 2024-01-07 DIAGNOSIS — G4733 Obstructive sleep apnea (adult) (pediatric): Secondary | ICD-10-CM | POA: Diagnosis not present

## 2024-01-07 DIAGNOSIS — I130 Hypertensive heart and chronic kidney disease with heart failure and stage 1 through stage 4 chronic kidney disease, or unspecified chronic kidney disease: Secondary | ICD-10-CM

## 2024-01-07 DIAGNOSIS — Z794 Long term (current) use of insulin: Secondary | ICD-10-CM | POA: Diagnosis not present

## 2024-01-07 DIAGNOSIS — N1831 Chronic kidney disease, stage 3a: Secondary | ICD-10-CM | POA: Diagnosis not present

## 2024-01-07 DIAGNOSIS — I5042 Chronic combined systolic (congestive) and diastolic (congestive) heart failure: Secondary | ICD-10-CM

## 2024-01-07 DIAGNOSIS — Z7982 Long term (current) use of aspirin: Secondary | ICD-10-CM | POA: Insufficient documentation

## 2024-01-07 DIAGNOSIS — Z79899 Other long term (current) drug therapy: Secondary | ICD-10-CM | POA: Diagnosis not present

## 2024-01-07 DIAGNOSIS — D509 Iron deficiency anemia, unspecified: Secondary | ICD-10-CM | POA: Diagnosis present

## 2024-01-07 DIAGNOSIS — I110 Hypertensive heart disease with heart failure: Secondary | ICD-10-CM | POA: Diagnosis not present

## 2024-01-07 DIAGNOSIS — Z7984 Long term (current) use of oral hypoglycemic drugs: Secondary | ICD-10-CM | POA: Diagnosis not present

## 2024-01-07 DIAGNOSIS — Z87891 Personal history of nicotine dependence: Secondary | ICD-10-CM | POA: Insufficient documentation

## 2024-01-07 DIAGNOSIS — I5032 Chronic diastolic (congestive) heart failure: Secondary | ICD-10-CM | POA: Insufficient documentation

## 2024-01-07 DIAGNOSIS — E113293 Type 2 diabetes mellitus with mild nonproliferative diabetic retinopathy without macular edema, bilateral: Secondary | ICD-10-CM | POA: Insufficient documentation

## 2024-01-07 DIAGNOSIS — K31819 Angiodysplasia of stomach and duodenum without bleeding: Secondary | ICD-10-CM | POA: Diagnosis not present

## 2024-01-07 DIAGNOSIS — I4891 Unspecified atrial fibrillation: Secondary | ICD-10-CM | POA: Insufficient documentation

## 2024-01-07 DIAGNOSIS — Z7722 Contact with and (suspected) exposure to environmental tobacco smoke (acute) (chronic): Secondary | ICD-10-CM | POA: Insufficient documentation

## 2024-01-07 DIAGNOSIS — I2510 Atherosclerotic heart disease of native coronary artery without angina pectoris: Secondary | ICD-10-CM | POA: Insufficient documentation

## 2024-01-07 HISTORY — PX: HOT HEMOSTASIS: SHX5433

## 2024-01-07 HISTORY — PX: ENTEROSCOPY: SHX5533

## 2024-01-07 LAB — GLUCOSE, CAPILLARY
Glucose-Capillary: 102 mg/dL — ABNORMAL HIGH (ref 70–99)
Glucose-Capillary: 182 mg/dL — ABNORMAL HIGH (ref 70–99)

## 2024-01-07 SURGERY — ENTEROSCOPY
Anesthesia: General

## 2024-01-07 MED ORDER — PHENYLEPHRINE 80 MCG/ML (10ML) SYRINGE FOR IV PUSH (FOR BLOOD PRESSURE SUPPORT)
PREFILLED_SYRINGE | INTRAVENOUS | Status: DC | PRN
  Administered 2024-01-07 (×2): 160 ug via INTRAVENOUS

## 2024-01-07 MED ORDER — PROPOFOL 500 MG/50ML IV EMUL
INTRAVENOUS | Status: DC | PRN
  Administered 2024-01-07: 150 ug/kg/min via INTRAVENOUS

## 2024-01-07 MED ORDER — EPHEDRINE SULFATE-NACL 50-0.9 MG/10ML-% IV SOSY
PREFILLED_SYRINGE | INTRAVENOUS | Status: DC | PRN
  Administered 2024-01-07 (×2): 10 mg via INTRAVENOUS

## 2024-01-07 MED ORDER — GLUCAGON HCL RDNA (DIAGNOSTIC) 1 MG IJ SOLR
INTRAMUSCULAR | Status: DC | PRN
Start: 2024-01-07 — End: 2024-01-07
  Administered 2024-01-07: .25 mg via INTRAVENOUS

## 2024-01-07 MED ORDER — SODIUM CHLORIDE 0.9 % IV SOLN: INTRAVENOUS | Status: DC | PRN

## 2024-01-07 NOTE — H&P (Signed)
 Lucas Dudley is an 83 y.o. male.   Chief Complaint: iron deficiency anemia HPI: Lucas Dudley is a 83 y.o. male with past medical history of atrial fibrillation s/p watchman procedure now on chronic ASA, systolic CHF, hypertension, diabetes , coming for evaluation of IDA and history of AVMs.  The patient denies having any nausea, vomiting, fever, chills, hematochezia, melena, hematemesis, abdominal distention, abdominal pain, diarrhea, jaundice, pruritus or weight loss.   Past Medical History:  Diagnosis Date   Arthritis    Atrial fibrillation (HCC)    CAD (coronary artery disease)    a. Cath 03/17/15 showing 100% ostial D1, 50% prox LAD to mid LAD, 40% RPDA stenosis. Med rx. // Myoview 01/2020: EF 31 diffuse perfusion defect without reversibility (suspect artifact); reviewed with Dr. Tama High study felt to be low risk   Cataract    Mixed form OD   Chronic diastolic CHF (congestive heart failure) (HCC)    Chronically elevated hemidiaphragm - Right Side    Diabetic retinopathy (HCC)    NPDR OU   Essential hypertension    Glaucoma    POAG OU   History of gout    Hyperlipidemia    Hypertensive retinopathy    OU   Kidney stones    Melanoma of neck (HCC)    NICM (nonischemic cardiomyopathy) (HCC)    OSA on CPAP 2012   Prostate cancer (HCC)    Type II diabetes mellitus (HCC)     Past Surgical History:  Procedure Laterality Date   ABDOMINAL HERNIA REPAIR     w/mesh   ATRIAL FIBRILLATION ABLATION N/A 06/06/2021   Procedure: ATRIAL FIBRILLATION ABLATION;  Surgeon: Hillis Range, MD;  Location: [facility];  Service: Cardiovascular;  Laterality: N/A;   BIOPSY  05/24/2022   Procedure: BIOPSY;  Surgeon: Corbin Ade, MD;  Location: [facility];  Service: Endoscopy;;   BIOPSY  05/30/2022   Procedure: BIOPSY;  Surgeon: Corbin Ade, MD;  [facility]: [facility];  Service: Endoscopy;;   BIOPSY  01/10/2023   Procedure: BIOPSY;  Surgeon: Lemar Lofty., MD;   Location: [facility];  Service: Gastroenterology;;   CARDIAC CATHETERIZATION N/A 03/17/2015   Procedure: Left Heart Cath and Coronary Angiography;  Surgeon: Runell Gess, MD;  Location: [facility] INVASIVE CV LAB;  Service: Cardiovascular;  Laterality: N/A;   CARDIOVERSION N/A 08/09/2021   Procedure: CARDIOVERSION;  Surgeon: Chilton Si, MD;  Location: [facility] ENDOSCOPY;  Service: Cardiovascular;  Laterality: N/A;   CARDIOVERSION N/A 11/10/2021   Procedure: CARDIOVERSION;  Surgeon: Thomasene Ripple, DO;  Location: [facility];  Service: Cardiovascular;  Laterality: N/A;   carotid doppler  09/17/2008   rigt and left ICAs 0-49%;mildly  abnormal   CATARACT EXTRACTION Left 2020   Dr. Alben Spittle   COLONOSCOPY N/A 05/16/2017   Procedure: COLONOSCOPY;  Surgeon: Malissa Hippo, MD;  [facility]: [facility];  Service: Endoscopy;  Laterality: N/A;  930   COLONOSCOPY WITH PROPOFOL N/A 05/24/2022   Procedure: COLONOSCOPY WITH PROPOFOL;  Surgeon: Corbin Ade, MD;  [facility]: [facility];  Service: Endoscopy;  Laterality: N/A;   DOPPLER ECHOCARDIOGRAPHY  05/25/2009   EF 50-55%,LA mildly dilated, LV function normal   ELECTROPHYSIOLOGIC STUDY N/A 04/05/2015   Procedure: Cardioversion;  Surgeon: Thurmon Fair, MD;  Location: [facility];  Service: Cardiovascular;  Laterality: N/A;   ELECTROPHYSIOLOGIC STUDY N/A 09/06/2015   Procedure: Atrial Fibrillation Ablation;  Surgeon: Hillis Range, MD;  Location: [facility] INVASIVE CV LAB;  Service:  Cardiovascular;  Laterality: N/A;   ELECTROPHYSIOLOGIC STUDY N/A 07/12/2016   redo afib ablation by Dr Johney Frame   ENTEROSCOPY N/A 05/30/2022   Procedure: ENTEROSCOPY;  Surgeon: Corbin Ade, MD;  [facility];  Service: Endoscopy;  Laterality: N/A;   ESOPHAGOGASTRODUODENOSCOPY (EGD) WITH PROPOFOL N/A 05/24/2022   Procedure: ESOPHAGOGASTRODUODENOSCOPY (EGD) WITH PROPOFOL;  Surgeon: Corbin Ade, MD;  [facility];  Service: Endoscopy;  Laterality: N/A;    ESOPHAGOGASTRODUODENOSCOPY (EGD) WITH PROPOFOL N/A 01/10/2023   Procedure: ESOPHAGOGASTRODUODENOSCOPY (EGD) WITH PROPOFOL;  Surgeon: Meridee Score Netty Starring., MD;  Location: [facility] ENDOSCOPY;  Service: Gastroenterology;  Laterality: N/A;   EUS N/A 01/10/2023   Procedure: UPPER ENDOSCOPIC ULTRASOUND (EUS) RADIAL;  Surgeon: Lemar Lofty., MD;  Location: [facility]OS[facility];  Service: Gastroenterology;  Laterality: N/A;   EXCISIONAL HEMORRHOIDECTOMY     "inside and out"   EYE SURGERY Left 2020   Cat Sx - Dr. Alben Spittle   FINE NEEDLE ASPIRATION Right    knee; "drew ~ 1 quart off"   GIVENS CAPSULE STUDY N/A 05/25/2022   Procedure: GIVENS CAPSULE STUDY;  Surgeon: Lanelle Bal, DO;  Location: [facility];  Service: Endoscopy;  Laterality: N/A;   GIVENS CAPSULE STUDY N/A 01/23/2023   Procedure: GIVENS CAPSULE STUDY;  Surgeon: Dolores Frame, MD;  Location: [facility];  Service: Gastroenterology;  Laterality: N/A;  8:30am   HERNIA REPAIR     HOT HEMOSTASIS N/A 01/10/2023   Procedure: HOT HEMOSTASIS (ARGON PLASMA COAGULATION/BICAP);  Surgeon: Lemar Lofty., MD;  Location: [facility];  Service: Gastroenterology;  Laterality: N/A;   LAPAROSCOPIC CHOLECYSTECTOMY     LEFT ATRIAL APPENDAGE OCCLUSION N/A 06/28/2022   Procedure: LEFT ATRIAL APPENDAGE OCCLUSION;  Surgeon: Tonny Bollman, MD;  Location: [facility] INVASIVE CV LAB;  Service: Cardiovascular;  Laterality: N/A;   MELANOMA EXCISION Right    "neck"   NM MYOCAR PERF WALL MOTION  02/21/2012   EF 61% ,EXERCISE 7 METS. exercise stopped due to wheezing and shortness of breathe   POLYPECTOMY  05/16/2017   Procedure: POLYPECTOMY;  Surgeon: Malissa Hippo, MD;  [facility];  Service: Endoscopy;;  colon   POLYPECTOMY  05/24/2022   Procedure: POLYPECTOMY;  Surgeon: Corbin Ade, MD;  [facility];  Service: Endoscopy;;   POLYPECTOMY  01/10/2023   Procedure: POLYPECTOMY;  Surgeon: Meridee Score Netty Starring., MD;   Location: [facility];  Service: Gastroenterology;;   PROSTATECTOMY     SHOULDER OPEN ROTATOR CUFF REPAIR Right X 2   SUBMUCOSAL TATTOO INJECTION  05/30/2022   Procedure: SUBMUCOSAL TATTOO INJECTION;  Surgeon: Corbin Ade, MD;  [facility];  Service: Endoscopy;;   SUBMUCOSAL TATTOO INJECTION  01/10/2023   Procedure: SUBMUCOSAL TATTOO INJECTION;  Surgeon: Lemar Lofty., MD;  Location: [facility];  Service: Gastroenterology;;   TEE WITHOUT CARDIOVERSION N/A 09/05/2015   Procedure: TRANSESOPHAGEAL ECHOCARDIOGRAM (TEE);  Surgeon: Quintella Reichert, MD;  Location: [facility] ENDOSCOPY;  Service: Cardiovascular;  Laterality: N/A;   TEE WITHOUT CARDIOVERSION N/A 06/28/2022   Procedure: TRANSESOPHAGEAL ECHOCARDIOGRAM (TEE);  Surgeon: Tonny Bollman, MD;  Location: [facility] INVASIVE CV LAB;  Service: Cardiovascular;  Laterality: N/A;    Family History  Problem Relation Age of Onset   Heart disease Mother    Lung cancer Mother    Heart attack Mother 71   Diabetes Father    Heart disease Father    Stroke Brother    Healthy Daughter    Colon cancer Maternal Aunt  103s   Social History:  reports that he quit smoking about 33 years ago. His smoking use included cigarettes. He started smoking about 60 years ago. He has a 54 pack-year smoking history. He has been exposed to tobacco smoke. He has never used smokeless tobacco. He reports that he does not drink alcohol and does not use drugs.  Allergies:  Allergies  Allergen Reactions   Azithromycin Nausea Only   Tape Rash and Other (See Comments)    Causes skin redness, Use paper tape only.    Medications Prior to Admission  Medication Sig Dispense Refill   acetaminophen (TYLENOL) 650 MG CR tablet Take 1,300 mg by mouth at bedtime.     amiodarone (PACERONE) 200 MG tablet TAKE 1 TABLET BY MOUTH EVERY DAY 90 tablet 1   aspirin 81 MG chewable tablet Chew 81 mg by mouth daily.     COMBIGAN 0.2-0.5 % ophthalmic solution Place 1 drop  into both eyes 2 (two) times daily.     ELDERBERRY PO Take 2 tablets by mouth daily. Chewable     ferrous sulfate 325 (65 FE) MG tablet Take 1 tablet (325 mg total) by mouth daily with breakfast. 120 tablet 1   fluticasone (FLONASE) 50 MCG/ACT nasal spray SPRAY 2 SPRAYS INTO EACH NOSTRIL EVERY DAY 48 mL 1   gemfibrozil (LOPID) 600 MG tablet Take 600 mg by mouth at bedtime.      glimepiride (AMARYL) 2 MG tablet Take 1 tablet (2 mg total) by mouth 2 (two) times daily with a meal. (Patient taking differently: Take 2 mg by mouth daily with breakfast.) 60 tablet 3   hydrALAZINE (APRESOLINE) 10 MG tablet Take 2 tablets (20 mg total) by mouth 3 (three) times daily. 540 tablet 3   magnesium gluconate (MAGONATE) 500 MG tablet Take 500 mg by mouth daily.     metFORMIN (GLUCOPHAGE-XR) 500 MG 24 hr tablet Take 1 tablet (500 mg total) by mouth 2 (two) times daily with a meal. 180 tablet 1   OVER THE COUNTER MEDICATION Take 1 tablet by mouth daily. Beets Chew     pantoprazole (PROTONIX) 40 MG tablet TAKE 1 TABLET BY MOUTH EVERY DAY 90 tablet 1   potassium chloride (KLOR-CON) 10 MEQ tablet Take 1 tablet (10 mEq total) by mouth daily. 90 tablet 1   torsemide (DEMADEX) 20 MG tablet 40 Mg in the morning and 20 Mg in the evening     zinc gluconate 50 MG tablet Take 50 mg by mouth daily.     albuterol (VENTOLIN HFA) 108 (90 Base) MCG/ACT inhaler Inhale 2 puffs into the lungs every 6 (six) hours as needed for wheezing or shortness of breath.     ammonium lactate (AMLACTIN DAILY) 12 % lotion Apply 1 Application topically as needed. 400 g 0   amoxicillin-clavulanate (AUGMENTIN) 875-125 MG tablet Take 1 tablet by mouth 2 (two) times daily. 14 tablet 0   Artificial Tear Ointment (DRY EYES OP) Place 1 drop into both eyes daily as needed (Dry eye).     camphor-menthol (ANTI-ITCH) lotion Apply 1 Application topically daily as needed for itching.     Insulin Glargine (BASAGLAR KWIKPEN) 100 UNIT/ML Inject 45 Units into the  skin at bedtime. (Patient taking differently: Inject 38 Units into the skin at bedtime.) 15 mL 3   mineral oil liquid Take 15 mLs by mouth daily as needed for moderate constipation.      Results for orders placed or performed during the hospital encounter of 01/07/24 (  from the past 48 hours)  Glucose, capillary     Status: Abnormal   Collection Time: 01/07/24 12:16 PM  Result Value Ref Range   Glucose-Capillary 102 (H) 70 - 99 mg/dL    Comment: Glucose reference range applies only to samples taken after fasting for at least 8 hours.   No results found.  Review of Systems  All other systems reviewed and are negative.   Pulse (!) 50, temperature 97.8 F (36.6 C), temperature source Oral, resp. rate (!) 9, SpO2 96%. Physical Exam  GENERAL: The patient is AO x3, in no acute distress. HEENT: Head is normocephalic and atraumatic. EOMI are intact. Mouth is well hydrated and without lesions. NECK: Supple. No masses LUNGS: Clear to auscultation. No presence of rhonchi/wheezing/rales. Adequate chest expansion HEART: RRR, normal s1 and s2. ABDOMEN: Soft, nontender, no guarding, no peritoneal signs, and nondistended. BS +. No masses. EXTREMITIES: Without any cyanosis, clubbing, rash, lesions or edema. NEUROLOGIC: AOx3, no focal motor deficit. SKIN: no jaundice, no rashes  Assessment/Plan Lucas Dudley is a 83 y.o. male with past medical history of atrial fibrillation s/p watchman procedure now on chronic ASA, systolic CHF, hypertension, diabetes , coming for evaluation of IDA and history of AVMs. We will prcoeed with enteroscopy  Dolores Frame, MD 01/07/2024, 1:41 PM

## 2024-01-07 NOTE — Transfer of Care (Signed)
 Immediate Anesthesia Transfer of Care Note  Patient: Lucas Dudley  Procedure(s) Performed: ENTEROSCOPY EGD, WITH ARGON PLASMA COAGULATION  Patient Location: PACU  Anesthesia Type:General  Level of Consciousness: awake, drowsy, and patient cooperative  Airway & Oxygen Therapy: Patient Spontanous Breathing and non-rebreather face mask  Post-op Assessment: Report given to RN, Post -op Vital signs reviewed and stable, and Patient moving all extremities X 4  Post vital signs: Reviewed and stable  Last Vitals:  Vitals Value Taken Time  BP 114/40 01/07/24 1432  Temp 36.7 C 01/07/24 1432  Pulse 54 01/07/24 1437  Resp 21 01/07/24 1437  SpO2 99 % 01/07/24 1437  Vitals shown include unfiled device data.  Last Pain:  Vitals:   01/07/24 1349  TempSrc:   PainSc: 0-No pain         Complications: No notable events documented.

## 2024-01-07 NOTE — Discharge Instructions (Signed)
 You are being discharged to home.  Resume your previous diet.  Start octreotide 20 mg every 4 weeks IM for AVM recurrent bleeding.

## 2024-01-07 NOTE — Anesthesia Preprocedure Evaluation (Signed)
 Anesthesia Evaluation  Patient identified by MRN, date of birth, ID band Patient awake    Reviewed: Allergy & Precautions, H&P , NPO status , Patient's Chart, lab work & pertinent test results, reviewed documented beta blocker date and time   Airway Mallampati: II  TM Distance: >3 FB Neck ROM: full    Dental no notable dental hx.    Pulmonary neg pulmonary ROS, shortness of breath, sleep apnea , former smoker   Pulmonary exam normal breath sounds clear to auscultation       Cardiovascular Exercise Tolerance: Good hypertension, + CAD, + Cardiac Stents and +CHF  negative cardio ROS + dysrhythmias Atrial Fibrillation  Rhythm:regular Rate:Normal     Neuro/Psych  Neuromuscular disease negative neurological ROS  negative psych ROS   GI/Hepatic negative GI ROS, Neg liver ROS, PUD,,,  Endo/Other  negative endocrine ROSdiabetes    Renal/GU Renal diseasenegative Renal ROS  negative genitourinary   Musculoskeletal   Abdominal   Peds  Hematology negative hematology ROS (+) Blood dyscrasia, anemia   Anesthesia Other Findings   Reproductive/Obstetrics negative OB ROS                             Anesthesia Physical Anesthesia Plan  ASA: 3  Anesthesia Plan: General   Post-op Pain Management:    Induction:   PONV Risk Score and Plan: Propofol infusion  Airway Management Planned:   Additional Equipment:   Intra-op Plan:   Post-operative Plan:   Informed Consent: I have reviewed the patients History and Physical, chart, labs and discussed the procedure including the risks, benefits and alternatives for the proposed anesthesia with the patient or authorized representative who has indicated his/her understanding and acceptance.     Dental Advisory Given  Plan Discussed with: CRNA  Anesthesia Plan Comments:        Anesthesia Quick Evaluation

## 2024-01-07 NOTE — Op Note (Signed)
 [facility] Patient Name: Lucas Dudley Procedure Date: 01/07/2024 1:00 PM MRN: [id number] Date of Birth: October 22, 1940 Attending MD: Katrinka Blazing , , [id number] CSN: [id number] Age: 83 Admit Type: Outpatient Procedure:                Small bowel enteroscopy Indications:              Iron deficiency anemia, history of AVMs Providers:                Katrinka Blazing, Buel Ream. Museum/gallery exhibitions officer, Charity fundraiser,                            Judeth Cornfield. Jessee Avers, Technician Referring MD:              Medicines:                Monitored Anesthesia Care Complications:            No immediate complications. Estimated Blood Loss:     Estimated blood loss: none. Procedure:                Pre-Anesthesia Assessment:                           - Prior to the procedure, a History and Physical                            was performed, and patient medications, allergies                            and sensitivities were reviewed. The patient's                            tolerance of previous anesthesia was reviewed.                           - The risks and benefits of the procedure and the                            sedation options and risks were discussed with the                            patient. All questions were answered and informed                            consent was obtained.                           - ASA Grade Assessment: III - A patient with severe                            systemic disease.                           After obtaining informed consent, the endoscope was                            passed under  direct vision. Throughout the                            procedure, the patient's blood pressure, pulse, and                            oxygen saturations were monitored continuously. The                            [id number] scope was introduced through                            the mouth and advanced to the proximal jejunum. The                            small bowel enteroscopy  was accomplished without                            difficulty. The patient tolerated the procedure                            well. Scope In: 1:55:36 PM Scope Out: 2:21:28 PM Total Procedure Duration: 0 hours 25 minutes 52 seconds  Findings:      The esophagus was normal.      The stomach was normal.      There was no evidence of significant pathology in the entire examined       duodenum.      Two tattoos were seen in the proximal jejunum. The tattoo sites appeared       normal.      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 Coagulation for bleeding prevention using argon plasma at 0.3       liters/minute and 20 watts was successful. Impression:               - Normal esophagus.                           - Normal stomach.                           - Normal examined duodenum.                           - A tattoo was seen in the jejunum. The tattoo site                            appeared normal.                           - Two non-bleeding angiodysplastic lesions in the                            duodenum. Treated with argon plasma coagulation                            ([facility]).                           -  No specimens collected. Moderate Sedation:      Per Anesthesia Care Recommendation:           - Discharge patient to home (ambulatory).                           - Resume previous diet.                           - Start octreotide 20 mg every 4 weeks IM for AVM                            recurrent bleeding. 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 All rights reserved. The codes documented in this report are preliminary and upon coder review may  be revised to meet current compliance requirements. Katrinka Blazing, MD Katrinka Blazing,  01/07/2024 2:28:13 PM This report has been signed electronically. Number of Addenda: 0

## 2024-01-07 NOTE — Progress Notes (Signed)
 Octreotide IM

## 2024-01-08 ENCOUNTER — Telehealth: Payer: Self-pay

## 2024-01-08 ENCOUNTER — Encounter (HOSPITAL_COMMUNITY): Payer: Self-pay | Admitting: Gastroenterology

## 2024-01-08 NOTE — Telephone Encounter (Signed)
 Auth Submission: APPROVED Site of care: Site of care: [facility] Payer: [employer] medicare/ [employer] Medication & CPT/J Code(s) submitted: Sandostatin (Octreotide LAR) W0981 Route of submission (phone, fax, portal): portal Phone # Fax # Auth type: Buy/Bill PB Units/visits requested: 20mg , q28days x 180days Reference number: [id number] Approval from: 01/08/24 to 07/05/24

## 2024-01-10 ENCOUNTER — Other Ambulatory Visit (HOSPITAL_COMMUNITY)
Admission: RE | Admit: 2024-01-10 | Discharge: 2024-01-10 | Disposition: A | Discharge status: Home / Self Care | Source: Ambulatory Visit | Attending: Cardiology | Admitting: Cardiology

## 2024-01-10 DIAGNOSIS — Z01810 Encounter for preprocedural cardiovascular examination: Secondary | ICD-10-CM | POA: Insufficient documentation

## 2024-01-10 DIAGNOSIS — I5031 Acute diastolic (congestive) heart failure: Secondary | ICD-10-CM | POA: Diagnosis not present

## 2024-01-10 DIAGNOSIS — I272 Pulmonary hypertension, unspecified: Secondary | ICD-10-CM | POA: Insufficient documentation

## 2024-01-10 DIAGNOSIS — I2510 Atherosclerotic heart disease of native coronary artery without angina pectoris: Secondary | ICD-10-CM

## 2024-01-10 DIAGNOSIS — I130 Hypertensive heart and chronic kidney disease with heart failure and stage 1 through stage 4 chronic kidney disease, or unspecified chronic kidney disease: Secondary | ICD-10-CM | POA: Diagnosis not present

## 2024-01-10 LAB — CBC
HCT: 30.3 % — ABNORMAL LOW (ref 39.0–52.0)
Hemoglobin: 9.5 g/dL — ABNORMAL LOW (ref 13.0–17.0)
MCH: 30.3 pg (ref 26.0–34.0)
MCHC: 31.4 g/dL (ref 30.0–36.0)
MCV: 96.5 fL (ref 80.0–100.0)
Platelets: 251 10*3/uL (ref 150–400)
RBC: 3.14 MIL/uL — ABNORMAL LOW (ref 4.22–5.81)
RDW: 19.9 % — ABNORMAL HIGH (ref 11.5–15.5)
WBC: 6.3 10*3/uL (ref 4.0–10.5)
nRBC: 0 % (ref 0.0–0.2)

## 2024-01-10 LAB — BASIC METABOLIC PANEL WITH GFR
Anion gap: 12 (ref 5–15)
BUN: 27 mg/dL — ABNORMAL HIGH (ref 8–23)
CO2: 30 mmol/L (ref 22–32)
Calcium: 9.1 mg/dL (ref 8.9–10.3)
Chloride: 94 mmol/L — ABNORMAL LOW (ref 98–111)
Creatinine, Ser: 1.51 mg/dL — ABNORMAL HIGH (ref 0.61–1.24)
GFR, Estimated: 46 mL/min — ABNORMAL LOW (ref >60)
Glucose, Bld: 248 mg/dL — ABNORMAL HIGH (ref 70–99)
Potassium: 3.9 mmol/L (ref 3.5–5.1)
Sodium: 136 mmol/L (ref 135–145)

## 2024-01-10 NOTE — Anesthesia Postprocedure Evaluation (Signed)
 Anesthesia Post Note  Patient: Lucas Dudley  Procedure(s) Performed: ENTEROSCOPY EGD, WITH ARGON PLASMA COAGULATION  Patient location during evaluation: Phase II Anesthesia Type: General Level of consciousness: awake Pain management: pain level controlled Vital Signs Assessment: post-procedure vital signs reviewed and stable Respiratory status: spontaneous breathing and respiratory function stable Cardiovascular status: blood pressure returned to baseline and stable Postop Assessment: no headache and no apparent nausea or vomiting Anesthetic complications: no Comments: Late entry   No notable events documented.   Last Vitals:  Vitals:   01/07/24 1445 01/07/24 1457  BP: 126/60 (!) 142/68  Pulse: (!) 54 68  Resp: 19 13  Temp:  36.6 C  SpO2: 100% 94%    Last Pain:  Vitals:   01/08/24 1501  TempSrc:   PainSc: 0-No pain                 Windell Norfolk

## 2024-01-11 NOTE — Progress Notes (Unsigned)
 SENDING TO EMERGENCY DEPARTMENT: Patient with hypoxia on room air, saturating 85-90%.  Afebrile, no tachycardia.  On exam, left lung base is significantly diminished compared to right lung.  Recommended ED referral for further workup and treatment as necessary.  Patient agreed.  Report given to ED attending Alvino Blood MD) via Secure Chat.    [facility] [address], [place] [address]   CLINIC:  Medical Oncology/Hematology  PCP:  Anabel Halon, MD [address] [place] [address] [phone]   REASON FOR VISIT:  Follow-up for normocytic anemia  PRIOR THERAPY: History of PRBC transfusions (2023)  CURRENT THERAPY: Oral iron, B12 supplement, intermittent IV iron  INTERVAL HISTORY:   Lucas Dudley 83 y.o. male returns for routine follow-up of normocytic anemia.  He was last seen by Rojelio Brenner PA-C on 08/13/2023. He received IV Feraheme x 2 in November 2024. We were notified by patient's PCP (11/19/2023) due to recurrent anemia (Labs from 11/18/2023 showed Hgb 9.3, ferritin 12, iron saturation 5%).  Patient received IV Feraheme x 3 in February 2025. Small bowel endoscopy by Dr. 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  At today's visit, he reports feeling somewhat poorly.  He felt slightly improved energy after his IV iron February 2025, but is noticing some recurrent fatigue.   He had some rectal bleeding last week (hemorrhoids, per patient), occurs about once a month. He has intermittent dark bowel movements, reports a single episode of "shiny black" stool about 3 months ago. He notes occasional epistaxis in colder weather (lasting <5 minutes).  He reports ice pica, fatigue, and dyspnea on exertion. He has intermittent chest pain, following with cardiology (Dr. Diona Browner).  He denies any restless legs, headaches, lightheadedness, or syncope.  He is taking daily  iron tablet and B12 supplement.  He has little to no energy and little to no appetite. He endorses that he is maintaining a stable weight.  ASSESSMENT & PLAN:  1.  Normocytic anemia (iron deficiency & B12 deficiency): - Patient seen at the request of Doylene Bode, NP -Hospitalized in August 2023 with Hgb 7.6, received 2 units PRBC.  He underwent EGD and colonoscopy which did not show any evidence of bleeding.  Subsequent enteroscopy again did not show a clear source of bleeding. 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  Small bowel nodule - junction distal duodenum/jejunum.  Irregular with a submucosal component, suspicious in appearance.  Pathology was benign. - Colonoscopy (05/24/2022): 5 mm polyp in the sigmoid colon, diverticulosis in the descending colon and at the splenic flexure.  Pathology - hyperplastic polyp. - EGD (05/24/2022): Normal esophagus, small hiatal hernia, friable gastric mucosa, couple of tiny gastric erosions, normal duodenal bulb and second part of duodenum. - Small bowel endoscopy by Dr. 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 - He was on Eliquis until September 2023, now s/p Watchman procedure.  Takes aspirin daily. - Most recent IV iron with Feraheme x 3 in February 2025  - He is taking iron tablet and vitamin supplement daily  - Hematology workup (09/03/2022): Hemoccult stool positive x 2 Normal reticulocytes, haptoglobin, DAT/Coombs. Persistent but improved iron deficiency with ferritin 33, iron saturation 17% Vitamin B12 deficiency with low B12 162, normal MMA.  Normal copper. SPEP negative.  Minimal elevations in free light chains with kappa 41.1, normal lambda 21.3,  and mildly elevated ratio 1.93.  Immunofixation normal. Labs from 08/01/2022 show evidence of mild CKD stage IIIa (creatinine 1.32/GFR 55) - Recent lab  trends  (11/18/2023): Hgb 9.3, ferritin 12, iron saturation 5% (12/30/2023): Hgb improved to 11.7, after IV Feraheme x 3 (11/21/2023 through 12/09/2023) (01/06/2024): Hgb dropped again to 9.9, ferritin 77, iron saturation 19% - Reports intermittent rectal bleeding and possible melanotic stool - Symptomatic with fatigue, ice pica, dyspnea on exertion - DIFFERENTIAL DIAGNOSIS: Suspect iron deficiency anemia in the setting of occult GI bleeding as well as vitamin B12 deficiency.  May also have some small aspect of anemia related to his CKD. - PLAN: Recommend IV Feraheme x 2. - Monthly CBC with BB sample - Continue daily iron supplement - B12 has normalized.  Can decrease to vitamin B12 1000 mcg every other day.    - Same-day labs (CBC/D, BB sample, ferritin, iron/TIBC) + OFFICE visit in 2 months  - Continue GI follow-up (CC chart to Dr. Levon Hedger and NP Doylene Bode due to continuing Hgb decline)   2.  Hypoxia - Oxygen saturations 84% to 90% on room air during office visit - Patient does have some worsening shortness of breath and cough.  No fever. - PMH significant for CHF.  No known COPD or asthma. - Left lung base significantly diminished on physical exam. - PLAN: Will send patient to ED for further evaluation.  3.  Social/family history: - Lives at home by himself.  He is independent of ADLs and IADLs.  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  He has exposure to torrent chemicals.  He smoked 1 pack/day for 35 years and quit smoking in 1992. - No family history of significant anemia. - Mother had mesothelioma.  Maternal aunt had colon cancer.  Maternal uncle had adrenal gland cancer.  PLAN SUMMARY: >> IV Feraheme x 2 >> Monthly CBC/BB sample >> Labs in 2 months = CBC/D, ferritin, iron/TIBC, BB sample >> Same-day OFFICE visit in 2 months >> Continue GI follow-up.  (Chart CC'd to Dr. Levon Hedger and NP Doylene Bode)     REVIEW OF SYSTEMS:   Review of Systems   Constitutional:  Positive for fatigue. Negative for appetite change, chills, diaphoresis, fever and unexpected weight change.  HENT:   Negative for lump/mass and nosebleeds.   Eyes:  Negative for eye problems.  Respiratory:  Positive for cough and shortness of breath (with exertion). Negative for hemoptysis.   Cardiovascular:  Positive for chest pain (last weekend). Negative for leg swelling and palpitations.  Gastrointestinal:  Positive for constipation. Negative for abdominal pain, blood in stool, diarrhea, nausea and vomiting.  Genitourinary:  Negative for hematuria.   Skin: Negative.   Neurological:  Negative for dizziness, headaches, light-headedness and numbness.  Hematological:  Does not bruise/bleed easily.  Psychiatric/Behavioral:  Positive for sleep disturbance.      PHYSICAL EXAM:  ECOG PERFORMANCE STATUS: 1 - Symptomatic but completely ambulatory  Vitals:   01/13/24 1426  BP: (!) 157/86  Pulse: 64  Resp: 16  Temp: 98.6 F (37 C)  SpO2: 90%    Filed Weights   01/13/24 1426  Weight: 251 lb 8.7 oz (114.1 kg)    Physical Exam Constitutional:      Appearance: Normal appearance. He is obese.  Cardiovascular:     Heart sounds: Normal heart sounds.  Pulmonary:     Breath sounds: Normal breath sounds. Decreased air movement present.     Comments: Significantly diminished breath  sounds in left lower lung fields. Musculoskeletal:     Right lower leg: Edema present.     Left lower leg: Edema present.  Neurological:     General: No focal deficit present.     Mental Status: Mental status is at baseline.  Psychiatric:        Behavior: Behavior normal. Behavior is cooperative.     PAST MEDICAL/SURGICAL HISTORY:  Past Medical History:  Diagnosis Date   Arthritis    Atrial fibrillation (HCC)    CAD (coronary artery disease)    a. Cath 03/17/15 showing 100% ostial D1, 50% prox LAD to mid LAD, 40% RPDA stenosis. Med rx. // Myoview 01/2020: EF 31 diffuse perfusion defect  without reversibility (suspect artifact); reviewed with Dr. Tama High study felt to be low risk   Cataract    Mixed form OD   Chronic diastolic CHF (congestive heart failure) (HCC)    Chronically elevated hemidiaphragm - Right Side    Diabetic retinopathy (HCC)    NPDR OU   Essential hypertension    Glaucoma    POAG OU   History of gout    Hyperlipidemia    Hypertensive retinopathy    OU   Kidney stones    Melanoma of neck (HCC)    NICM (nonischemic cardiomyopathy) (HCC)    OSA on CPAP 2012   Prostate cancer (HCC)    Type II diabetes mellitus (HCC)    Past Surgical History:  Procedure Laterality Date   ABDOMINAL HERNIA REPAIR     w/mesh   ATRIAL FIBRILLATION ABLATION N/A 06/06/2021   Procedure: ATRIAL FIBRILLATION ABLATION;  Surgeon: Hillis Range, MD;  Location: [facility] INVASIVE CV LAB;  Service: Cardiovascular;  Laterality: N/A;   BIOPSY  05/24/2022   Procedure: BIOPSY;  Surgeon: Corbin Ade, MD;  Location: [facility];  Service: Endoscopy;;   BIOPSY  05/30/2022   Procedure: BIOPSY;  Surgeon: Corbin Ade, MD;  Location: [facility];  Service: Endoscopy;;   BIOPSY  01/10/2023   Procedure: BIOPSY;  Surgeon: Lemar Lofty., MD;  Location: [facility] ENDOSCOPY;  Service: Gastroenterology;;   CARDIAC CATHETERIZATION N/A 03/17/2015   Procedure: Left Heart Cath and Coronary Angiography;  Surgeon: Runell Gess, MD;  Location: [facility] INVASIVE CV LAB;  Service: Cardiovascular;  Laterality: N/A;   CARDIOVERSION N/A 08/09/2021   Procedure: CARDIOVERSION;  Surgeon: Chilton Si, MD;  Location: [facility] ENDOSCOPY;  Service: Cardiovascular;  Laterality: N/A;   CARDIOVERSION N/A 11/10/2021   Procedure: CARDIOVERSION;  Surgeon: Thomasene Ripple, DO;  Location: [facility] ENDOSCOPY;  Service: Cardiovascular;  Laterality: N/A;   carotid doppler  09/17/2008   rigt and left ICAs 0-49%;mildly  abnormal   CATARACT EXTRACTION Left 2020   Dr. Alben Spittle   COLONOSCOPY N/A 05/16/2017   Procedure: COLONOSCOPY;   Surgeon: Malissa Hippo, MD;  [facility]: [facility];  Service: Endoscopy;  Laterality: N/A;  930   COLONOSCOPY WITH PROPOFOL N/A 05/24/2022   Procedure: COLONOSCOPY WITH PROPOFOL;  Surgeon: Corbin Ade, MD;  [facility]: [facility];  Service: Endoscopy;  Laterality: N/A;   DOPPLER ECHOCARDIOGRAPHY  05/25/2009   EF 50-55%,LA mildly dilated, LV function normal   ELECTROPHYSIOLOGIC STUDY N/A 04/05/2015   Procedure: Cardioversion;  Surgeon: Thurmon Fair, MD;  Location: [facility] CV LAB;  Service: Cardiovascular;  Laterality: N/A;   ELECTROPHYSIOLOGIC STUDY N/A 09/06/2015   Procedure: Atrial Fibrillation Ablation;  Surgeon: Hillis Range, MD;  Location: [facility] INVASIVE CV LAB;  Service: Cardiovascular;  Laterality: N/A;   ELECTROPHYSIOLOGIC  STUDY N/A 07/12/2016   redo afib ablation by Dr Johney Frame   ENTEROSCOPY N/A 05/30/2022   Procedure: ENTEROSCOPY;  Surgeon: Corbin Ade, MD;  [facility];  Service: Endoscopy;  Laterality: N/A;   ENTEROSCOPY N/A 01/07/2024   Procedure: ENTEROSCOPY;  Surgeon: Dolores Frame, MD;  [facility];  Service: Gastroenterology;  Laterality: N/A;  1:45PM;ASA 3   ESOPHAGOGASTRODUODENOSCOPY (EGD) WITH PROPOFOL N/A 05/24/2022   Procedure: ESOPHAGOGASTRODUODENOSCOPY (EGD) WITH PROPOFOL;  Surgeon: Corbin Ade, MD;  [facility]: [facility];  Service: Endoscopy;  Laterality: N/A;   ESOPHAGOGASTRODUODENOSCOPY (EGD) WITH PROPOFOL N/A 01/10/2023   Procedure: ESOPHAGOGASTRODUODENOSCOPY (EGD) WITH PROPOFOL;  Surgeon: Meridee Score Netty Starring., MD;  Location: [facility] ENDOSCOPY;  Service: Gastroenterology;  Laterality: N/A;   EUS N/A 01/10/2023   Procedure: UPPER ENDOSCOPIC ULTRASOUND (EUS) RADIAL;  Surgeon: Lemar Lofty., MD;  Location: [facility] ENDOSCOPY;  Service: Gastroenterology;  Laterality: N/A;   EXCISIONAL HEMORRHOIDECTOMY     "inside and out"   EYE SURGERY Left 2020   Cat Sx - Dr. Alben Spittle   FINE NEEDLE ASPIRATION Right    knee; "drew ~ 1  quart off"   GIVENS CAPSULE STUDY N/A 05/25/2022   Procedure: GIVENS CAPSULE STUDY;  Surgeon: Lanelle Bal, DO;  Location: [facility];  Service: Endoscopy;  Laterality: N/A;   GIVENS CAPSULE STUDY N/A 01/23/2023   Procedure: GIVENS CAPSULE STUDY;  Surgeon: Dolores Frame, MD;  [facility]: [facility];  Service: Gastroenterology;  Laterality: N/A;  8:30am   HERNIA REPAIR     HOT HEMOSTASIS N/A 01/10/2023   Procedure: HOT HEMOSTASIS (ARGON PLASMA COAGULATION/BICAP);  Surgeon: Lemar Lofty., MD;  Location: [facility] ENDOSCOPY;  Service: Gastroenterology;  Laterality: N/A;   HOT HEMOSTASIS  01/07/2024   Procedure: EGD, WITH ARGON PLASMA COAGULATION;  Surgeon: Dolores Frame, MD;  [facility];  Service: Gastroenterology;;   LAPAROSCOPIC CHOLECYSTECTOMY     LEFT ATRIAL APPENDAGE OCCLUSION N/A 06/28/2022   Procedure: LEFT ATRIAL APPENDAGE OCCLUSION;  Surgeon: Tonny Bollman, MD;  Location: [facility] INVASIVE CV LAB;  Service: Cardiovascular;  Laterality: N/A;   MELANOMA EXCISION Right    "neck"   NM MYOCAR PERF WALL MOTION  02/21/2012   EF 61% ,EXERCISE 7 METS. exercise stopped due to wheezing and shortness of breathe   POLYPECTOMY  05/16/2017   Procedure: POLYPECTOMY;  Surgeon: Malissa Hippo, MD;  [facility];  Service: Endoscopy;;  colon   POLYPECTOMY  05/24/2022   Procedure: POLYPECTOMY;  Surgeon: Corbin Ade, MD;  [facility];  Service: Endoscopy;;   POLYPECTOMY  01/10/2023   Procedure: POLYPECTOMY;  Surgeon: Meridee Score Netty Starring., MD;  Location: [facility] ENDOSCOPY;  Service: Gastroenterology;;   PROSTATECTOMY     SHOULDER OPEN ROTATOR CUFF REPAIR Right X 2   SUBMUCOSAL TATTOO INJECTION  05/30/2022   Procedure: SUBMUCOSAL TATTOO INJECTION;  Surgeon: Corbin Ade, MD;  Location: [facility];  Service: Endoscopy;;   SUBMUCOSAL TATTOO INJECTION  01/10/2023   Procedure: SUBMUCOSAL TATTOO INJECTION;  Surgeon: Lemar Lofty., MD;   Location: [facility] ENDOSCOPY;  Service: Gastroenterology;;   TEE WITHOUT CARDIOVERSION N/A 09/05/2015   Procedure: TRANSESOPHAGEAL ECHOCARDIOGRAM (TEE);  Surgeon: Quintella Reichert, MD;  Location: [facility] ENDOSCOPY;  Service: Cardiovascular;  Laterality: N/A;   TEE WITHOUT CARDIOVERSION N/A 06/28/2022   Procedure: TRANSESOPHAGEAL ECHOCARDIOGRAM (TEE);  Surgeon: Tonny Bollman, MD;  Location: [facility] INVASIVE CV LAB;  Service: Cardiovascular;  Laterality: N/A;    SOCIAL HISTORY:  Social History   Socioeconomic History   Marital status: Widowed    Spouse name: Not on file   Number of children: 1   Years of education: Not on file   Highest education level: Not on file  Occupational History   Occupation: Retired  Tobacco Use   Smoking status: Former    Current packs/day: 0.00    Average packs/day: 2.0 packs/day for 27.0 years (54.0 ttl pk-yrs)    Types: Cigarettes    Start date: 41    Quit date: 1992    Years since quitting: 33.2    Passive exposure: Current   Smokeless tobacco: Never   Tobacco comments:    Former smoker 09/27/21  Vaping Use   Vaping status: Never Used  Substance and Sexual Activity   Alcohol use: No    Alcohol/week: 0.0 standard drinks of alcohol    Comment: "used to drink; stopped ~ 2008"   Drug use: No   Sexual activity: Not Currently  Other Topics Concern   Not on file  Social History Narrative   Lives in [place], [place] with wife.   Social Drivers of Corporate investment banker Strain: Low Risk  (12/17/2023)   Overall Financial Resource Strain (CARDIA)    Difficulty of Paying Living Expenses: Not hard at all  Food Insecurity: No Food Insecurity (12/17/2023)   Hunger Vital Sign    Worried About Running Out of Food in the Last Year: Never true    Ran Out of Food in the Last Year: Never true  Transportation Needs: No Transportation Needs (12/14/2023)   PRAPARE - Administrator, Civil Service (Medical): No    Lack of Transportation (Non-Medical): No  Physical  Activity: Inactive (11/20/2023)   Exercise Vital Sign    Days of Exercise per Week: 0 days    Minutes of Exercise per Session: 0 min  Stress: No Stress Concern Present (11/20/2023)   [employer] of Occupational Health - Occupational Stress Questionnaire    Feeling of Stress : Not at all  Social Connections: Moderately Integrated (12/14/2023)   Social Connection and Isolation Panel [NHANES]    Frequency of Communication with Friends and Family: More than three times a week    Frequency of Social Gatherings with Friends and Family: More than three times a week    Attends Religious Services: More than 4 times per year    Active Member of [employer] or Organizations: Yes    Attends Banker Meetings: More than 4 times per year    Marital Status: Widowed  Recent Concern: Social Connections - Moderately Isolated (11/20/2023)   Social Connection and Isolation Panel [NHANES]    Frequency of Communication with Friends and Family: More than three times a week    Frequency of Social Gatherings with Friends and Family: Once a week    Attends Religious Services: More than 4 times per year    Active Member of [employer] or Organizations: No    Attends Banker Meetings: Never    Marital Status: Widowed  Intimate Partner Violence: Not At Risk (12/14/2023)   Humiliation, Afraid, Rape, and Kick questionnaire    Fear of Current or Ex-Partner: No    Emotionally Abused: No    Physically Abused: No    Sexually Abused: No    FAMILY HISTORY:  Family History  Problem Relation Age of Onset   Heart disease Mother    Lung cancer Mother    Heart attack Mother 70  Diabetes Father    Heart disease Father    Stroke Brother    Healthy Daughter    Colon cancer Maternal Aunt        70s    CURRENT MEDICATIONS:  Outpatient Encounter Medications as of 01/13/2024  Medication Sig Note   acetaminophen (TYLENOL) 650 MG CR tablet Take 1,300 mg by mouth at bedtime.    albuterol (VENTOLIN HFA) 108 (90  Base) MCG/ACT inhaler Inhale 2 puffs into the lungs every 6 (six) hours as needed for wheezing or shortness of breath.    amiodarone (PACERONE) 200 MG tablet TAKE 1 TABLET BY MOUTH EVERY DAY    ammonium lactate (AMLACTIN DAILY) 12 % lotion Apply 1 Application topically as needed.    amoxicillin-clavulanate (AUGMENTIN) 875-125 MG tablet Take 1 tablet by mouth 2 (two) times daily.    Artificial Tear Ointment (DRY EYES OP) Place 1 drop into both eyes daily as needed (Dry eye).    aspirin 81 MG chewable tablet Chew 81 mg by mouth daily.    camphor-menthol (ANTI-ITCH) lotion Apply 1 Application topically daily as needed for itching.    COMBIGAN 0.2-0.5 % ophthalmic solution Place 1 drop into both eyes 2 (two) times daily.    ELDERBERRY PO Take 2 tablets by mouth daily. Chewable    ferrous sulfate 325 (65 FE) MG tablet Take 1 tablet (325 mg total) by mouth daily with breakfast.    fluticasone (FLONASE) 50 MCG/ACT nasal spray SPRAY 2 SPRAYS INTO EACH NOSTRIL EVERY DAY    gemfibrozil (LOPID) 600 MG tablet Take 600 mg by mouth at bedtime.     glimepiride (AMARYL) 2 MG tablet Take 1 tablet (2 mg total) by mouth 2 (two) times daily with a meal. (Patient taking differently: Take 2 mg by mouth daily with breakfast.)    hydrALAZINE (APRESOLINE) 10 MG tablet Take 2 tablets (20 mg total) by mouth 3 (three) times daily. 01/02/2024: Has not started new regimen yet   Insulin Glargine (BASAGLAR KWIKPEN) 100 UNIT/ML Inject 45 Units into the skin at bedtime. (Patient taking differently: Inject 38 Units into the skin at bedtime.)    magnesium gluconate (MAGONATE) 500 MG tablet Take 500 mg by mouth daily.    metFORMIN (GLUCOPHAGE-XR) 500 MG 24 hr tablet Take 1 tablet (500 mg total) by mouth 2 (two) times daily with a meal.    mineral oil liquid Take 15 mLs by mouth daily as needed for moderate constipation.    OVER THE COUNTER MEDICATION Take 1 tablet by mouth daily. Beets Chew    pantoprazole (PROTONIX) 40 MG tablet TAKE  1 TABLET BY MOUTH EVERY DAY    potassium chloride (KLOR-CON) 10 MEQ tablet Take 1 tablet (10 mEq total) by mouth daily.    torsemide (DEMADEX) 20 MG tablet 40 Mg in the morning and 20 Mg in the evening    zinc gluconate 50 MG tablet Take 50 mg by mouth daily.    No facility-administered encounter medications on file as of 01/13/2024.    ALLERGIES:  Allergies  Allergen Reactions   Azithromycin Nausea Only   Tape Rash and Other (See Comments)    Causes skin redness, Use paper tape only.    LABORATORY DATA:  I have reviewed the labs as listed.  CBC    Component Value Date/Time   WBC 8.7 01/13/2024 1609   RBC 2.93 (L) 01/13/2024 1609   HGB 8.8 (L) 01/13/2024 1609   HGB 11.7 (L) 12/30/2023 1322   HCT 28.6 (L) 01/13/2024  1609   HCT 37.4 (L) 12/30/2023 1322   PLT 235 01/13/2024 1609   PLT 465 (H) 12/30/2023 1322   MCV 97.6 01/13/2024 1609   MCV 93 12/30/2023 1322   MCH 30.0 01/13/2024 1609   MCHC 30.8 01/13/2024 1609   RDW 19.6 (H) 01/13/2024 1609   RDW 18.8 (H) 12/30/2023 1322   LYMPHSABS 0.8 01/13/2024 1609   LYMPHSABS 0.9 12/30/2023 1322   MONOABS 0.7 01/13/2024 1609   EOSABS 0.0 01/13/2024 1609   EOSABS 0.3 12/30/2023 1322   BASOSABS 0.0 01/13/2024 1609   BASOSABS 0.1 12/30/2023 1322      Latest Ref Rng & Units 01/10/2024   11:41 AM 12/30/2023    1:22 PM 12/17/2023    3:18 AM  CMP  Glucose 70 - 99 mg/dL 161  096  045   BUN 8 - 23 mg/dL 27  24  28    Creatinine 0.61 - 1.24 mg/dL 4.09  8.11  9.14   Sodium 135 - 145 mmol/L 136  140  135   Potassium 3.5 - 5.1 mmol/L 3.9  4.6  3.9   Chloride 98 - 111 mmol/L 94  95  95   CO2 22 - 32 mmol/L 30  27  31    Calcium 8.9 - 10.3 mg/dL 9.1  9.4  9.1   Total Protein 6.0 - 8.5 g/dL  7.3    Total Bilirubin 0.0 - 1.2 mg/dL  0.3    Alkaline Phos 44 - 121 IU/L  77    AST 0 - 40 IU/L  16    ALT 0 - 44 IU/L  8      DIAGNOSTIC IMAGING:  I have independently reviewed the relevant imaging and discussed with the patient.   WRAP UP:   All questions were answered. The patient knows to call the clinic with any problems, questions or concerns.  Medical decision making: High (addressed acute hypoxia with patient being sent to ED)  Time spent on visit: I spent 25 minutes counseling the patient face to face. The total time spent in the appointment was 40 minutes and more than 50% was on counseling.  Carnella Guadalajara, PA-C  01/13/24 4:53 PM

## 2024-01-13 ENCOUNTER — Emergency Department (HOSPITAL_COMMUNITY)

## 2024-01-13 ENCOUNTER — Encounter (HOSPITAL_COMMUNITY): Payer: Self-pay

## 2024-01-13 ENCOUNTER — Other Ambulatory Visit: Payer: Self-pay

## 2024-01-13 ENCOUNTER — Inpatient Hospital Stay (HOSPITAL_COMMUNITY)
Admission: EM | Admit: 2024-01-13 | Discharge: 2024-01-28 | DRG: 286 | Disposition: A | Discharge status: Home / Self Care | Attending: Internal Medicine | Admitting: Internal Medicine

## 2024-01-13 ENCOUNTER — Telehealth: Payer: Self-pay | Admitting: *Deleted

## 2024-01-13 ENCOUNTER — Inpatient Hospital Stay: Payer: Medicare Other | Admitting: Physician Assistant

## 2024-01-13 ENCOUNTER — Telehealth: Payer: Self-pay | Admitting: Cardiology

## 2024-01-13 ENCOUNTER — Ambulatory Visit

## 2024-01-13 VITALS — BP 157/86 | HR 64 | Temp 98.6°F | Resp 16 | Wt 251.5 lb

## 2024-01-13 DIAGNOSIS — E66812 Obesity, class 2: Secondary | ICD-10-CM | POA: Diagnosis present

## 2024-01-13 DIAGNOSIS — I2723 Pulmonary hypertension due to lung diseases and hypoxia: Secondary | ICD-10-CM | POA: Diagnosis present

## 2024-01-13 DIAGNOSIS — E782 Mixed hyperlipidemia: Secondary | ICD-10-CM | POA: Diagnosis present

## 2024-01-13 DIAGNOSIS — I5031 Acute diastolic (congestive) heart failure: Principal | ICD-10-CM

## 2024-01-13 DIAGNOSIS — G4733 Obstructive sleep apnea (adult) (pediatric): Secondary | ICD-10-CM | POA: Diagnosis not present

## 2024-01-13 DIAGNOSIS — E1165 Type 2 diabetes mellitus with hyperglycemia: Secondary | ICD-10-CM | POA: Diagnosis present

## 2024-01-13 DIAGNOSIS — I428 Other cardiomyopathies: Secondary | ICD-10-CM | POA: Diagnosis present

## 2024-01-13 DIAGNOSIS — I5033 Acute on chronic diastolic (congestive) heart failure: Secondary | ICD-10-CM

## 2024-01-13 DIAGNOSIS — I4819 Other persistent atrial fibrillation: Secondary | ICD-10-CM | POA: Diagnosis present

## 2024-01-13 DIAGNOSIS — N179 Acute kidney failure, unspecified: Secondary | ICD-10-CM | POA: Diagnosis not present

## 2024-01-13 DIAGNOSIS — J189 Pneumonia, unspecified organism: Secondary | ICD-10-CM | POA: Diagnosis present

## 2024-01-13 DIAGNOSIS — Z79899 Other long term (current) drug therapy: Secondary | ICD-10-CM | POA: Diagnosis not present

## 2024-01-13 DIAGNOSIS — I480 Paroxysmal atrial fibrillation: Secondary | ICD-10-CM | POA: Diagnosis present

## 2024-01-13 DIAGNOSIS — Z6836 Body mass index (BMI) 36.0-36.9, adult: Secondary | ICD-10-CM

## 2024-01-13 DIAGNOSIS — I5043 Acute on chronic combined systolic (congestive) and diastolic (congestive) heart failure: Secondary | ICD-10-CM | POA: Diagnosis present

## 2024-01-13 DIAGNOSIS — H35033 Hypertensive retinopathy, bilateral: Secondary | ICD-10-CM | POA: Diagnosis present

## 2024-01-13 DIAGNOSIS — J9601 Acute respiratory failure with hypoxia: Secondary | ICD-10-CM | POA: Diagnosis present

## 2024-01-13 DIAGNOSIS — D500 Iron deficiency anemia secondary to blood loss (chronic): Secondary | ICD-10-CM

## 2024-01-13 DIAGNOSIS — Z85820 Personal history of malignant melanoma of skin: Secondary | ICD-10-CM

## 2024-01-13 DIAGNOSIS — I5189 Other ill-defined heart diseases: Secondary | ICD-10-CM | POA: Diagnosis not present

## 2024-01-13 DIAGNOSIS — Z95818 Presence of other cardiac implants and grafts: Secondary | ICD-10-CM | POA: Diagnosis not present

## 2024-01-13 DIAGNOSIS — N1832 Chronic kidney disease, stage 3b: Secondary | ICD-10-CM | POA: Diagnosis present

## 2024-01-13 DIAGNOSIS — D75839 Thrombocytosis, unspecified: Secondary | ICD-10-CM | POA: Diagnosis not present

## 2024-01-13 DIAGNOSIS — I440 Atrioventricular block, first degree: Secondary | ICD-10-CM | POA: Diagnosis not present

## 2024-01-13 DIAGNOSIS — J440 Chronic obstructive pulmonary disease with acute lower respiratory infection: Secondary | ICD-10-CM | POA: Diagnosis present

## 2024-01-13 DIAGNOSIS — Z87891 Personal history of nicotine dependence: Secondary | ICD-10-CM

## 2024-01-13 DIAGNOSIS — E113213 Type 2 diabetes mellitus with mild nonproliferative diabetic retinopathy with macular edema, bilateral: Secondary | ICD-10-CM | POA: Diagnosis present

## 2024-01-13 DIAGNOSIS — E1169 Type 2 diabetes mellitus with other specified complication: Secondary | ICD-10-CM | POA: Diagnosis not present

## 2024-01-13 DIAGNOSIS — Z7982 Long term (current) use of aspirin: Secondary | ICD-10-CM

## 2024-01-13 DIAGNOSIS — I25119 Atherosclerotic heart disease of native coronary artery with unspecified angina pectoris: Secondary | ICD-10-CM | POA: Diagnosis present

## 2024-01-13 DIAGNOSIS — N189 Chronic kidney disease, unspecified: Secondary | ICD-10-CM | POA: Diagnosis not present

## 2024-01-13 DIAGNOSIS — E1122 Type 2 diabetes mellitus with diabetic chronic kidney disease: Secondary | ICD-10-CM | POA: Diagnosis present

## 2024-01-13 DIAGNOSIS — E538 Deficiency of other specified B group vitamins: Secondary | ICD-10-CM | POA: Diagnosis present

## 2024-01-13 DIAGNOSIS — I5030 Unspecified diastolic (congestive) heart failure: Secondary | ICD-10-CM | POA: Diagnosis not present

## 2024-01-13 DIAGNOSIS — H401130 Primary open-angle glaucoma, bilateral, stage unspecified: Secondary | ICD-10-CM | POA: Diagnosis present

## 2024-01-13 DIAGNOSIS — I10 Essential (primary) hypertension: Secondary | ICD-10-CM | POA: Diagnosis present

## 2024-01-13 DIAGNOSIS — D509 Iron deficiency anemia, unspecified: Secondary | ICD-10-CM | POA: Diagnosis not present

## 2024-01-13 DIAGNOSIS — Z7984 Long term (current) use of oral hypoglycemic drugs: Secondary | ICD-10-CM

## 2024-01-13 DIAGNOSIS — Z8546 Personal history of malignant neoplasm of prostate: Secondary | ICD-10-CM

## 2024-01-13 DIAGNOSIS — I272 Pulmonary hypertension, unspecified: Secondary | ICD-10-CM | POA: Insufficient documentation

## 2024-01-13 DIAGNOSIS — I130 Hypertensive heart and chronic kidney disease with heart failure and stage 1 through stage 4 chronic kidney disease, or unspecified chronic kidney disease: Secondary | ICD-10-CM | POA: Diagnosis present

## 2024-01-13 DIAGNOSIS — Z794 Long term (current) use of insulin: Secondary | ICD-10-CM

## 2024-01-13 DIAGNOSIS — M7989 Other specified soft tissue disorders: Secondary | ICD-10-CM | POA: Diagnosis present

## 2024-01-13 DIAGNOSIS — I2722 Pulmonary hypertension due to left heart disease: Secondary | ICD-10-CM | POA: Diagnosis present

## 2024-01-13 DIAGNOSIS — I2510 Atherosclerotic heart disease of native coronary artery without angina pectoris: Secondary | ICD-10-CM | POA: Diagnosis not present

## 2024-01-13 DIAGNOSIS — Z823 Family history of stroke: Secondary | ICD-10-CM

## 2024-01-13 DIAGNOSIS — Z8249 Family history of ischemic heart disease and other diseases of the circulatory system: Secondary | ICD-10-CM

## 2024-01-13 LAB — COMPREHENSIVE METABOLIC PANEL WITH GFR
ALT: 20 U/L (ref 0–44)
AST: 28 U/L (ref 15–41)
Albumin: 3.6 g/dL (ref 3.5–5.0)
Alkaline Phosphatase: 52 U/L (ref 38–126)
Anion gap: 13 (ref 5–15)
BUN: 32 mg/dL — ABNORMAL HIGH (ref 8–23)
CO2: 28 mmol/L (ref 22–32)
Calcium: 8.7 mg/dL — ABNORMAL LOW (ref 8.9–10.3)
Chloride: 94 mmol/L — ABNORMAL LOW (ref 98–111)
Creatinine, Ser: 1.59 mg/dL — ABNORMAL HIGH (ref 0.61–1.24)
GFR, Estimated: 43 mL/min — ABNORMAL LOW (ref >60)
Glucose, Bld: 235 mg/dL — ABNORMAL HIGH (ref 70–99)
Potassium: 3.7 mmol/L (ref 3.5–5.1)
Sodium: 135 mmol/L (ref 135–145)
Total Bilirubin: 0.7 mg/dL (ref 0.0–1.2)
Total Protein: 7.5 g/dL (ref 6.5–8.1)

## 2024-01-13 LAB — CBC WITH DIFFERENTIAL/PLATELET
Abs Immature Granulocytes: 0.03 10*3/uL (ref 0.00–0.07)
Basophils Absolute: 0 10*3/uL (ref 0.0–0.1)
Basophils Relative: 0 %
Eosinophils Absolute: 0 10*3/uL (ref 0.0–0.5)
Eosinophils Relative: 0 %
HCT: 28.6 % — ABNORMAL LOW (ref 39.0–52.0)
Hemoglobin: 8.8 g/dL — ABNORMAL LOW (ref 13.0–17.0)
Immature Granulocytes: 0 %
Lymphocytes Relative: 9 %
Lymphs Abs: 0.8 10*3/uL (ref 0.7–4.0)
MCH: 30 pg (ref 26.0–34.0)
MCHC: 30.8 g/dL (ref 30.0–36.0)
MCV: 97.6 fL (ref 80.0–100.0)
Monocytes Absolute: 0.7 10*3/uL (ref 0.1–1.0)
Monocytes Relative: 8 %
Neutro Abs: 7.1 10*3/uL (ref 1.7–7.7)
Neutrophils Relative %: 83 %
Platelets: 235 10*3/uL (ref 150–400)
RBC: 2.93 MIL/uL — ABNORMAL LOW (ref 4.22–5.81)
RDW: 19.6 % — ABNORMAL HIGH (ref 11.5–15.5)
WBC: 8.7 10*3/uL (ref 4.0–10.5)
nRBC: 0 % (ref 0.0–0.2)

## 2024-01-13 LAB — TROPONIN I (HIGH SENSITIVITY)
Troponin I (High Sensitivity): 31 ng/L — ABNORMAL HIGH (ref <18)
Troponin I (High Sensitivity): 34 ng/L — ABNORMAL HIGH (ref <18)

## 2024-01-13 LAB — GLUCOSE, CAPILLARY: Glucose-Capillary: 190 mg/dL — ABNORMAL HIGH (ref 70–99)

## 2024-01-13 LAB — BRAIN NATRIURETIC PEPTIDE: B Natriuretic Peptide: 327 pg/mL — ABNORMAL HIGH (ref 0.0–100.0)

## 2024-01-13 MED ORDER — ASPIRIN 81 MG PO CHEW
81.0000 mg | CHEWABLE_TABLET | Freq: Every day | ORAL | Status: DC
  Administered 2024-01-14 – 2024-01-28 (×15): 81 mg via ORAL
  Filled 2024-01-13 (×15): qty 1

## 2024-01-13 MED ORDER — AMIODARONE HCL 200 MG PO TABS
200.0000 mg | ORAL_TABLET | Freq: Every day | ORAL | Status: DC
Start: 2024-01-13 — End: 2024-01-28
  Administered 2024-01-13 – 2024-01-27 (×15): 200 mg via ORAL
  Filled 2024-01-13 (×15): qty 1

## 2024-01-13 MED ORDER — SODIUM CHLORIDE 0.9 % IV SOLN
2.0000 g | INTRAVENOUS | Status: DC
  Administered 2024-01-13: 2 g via INTRAVENOUS
  Filled 2024-01-13: qty 20

## 2024-01-13 MED ORDER — SODIUM CHLORIDE 0.9 % WEIGHT BASED INFUSION: 3.0000 mL/kg/h | INTRAVENOUS | Status: DC

## 2024-01-13 MED ORDER — FUROSEMIDE 10 MG/ML IJ SOLN
40.0000 mg | Freq: Once | INTRAMUSCULAR | Status: AC
  Administered 2024-01-13: 40 mg via INTRAVENOUS
  Filled 2024-01-13: qty 4

## 2024-01-13 MED ORDER — FUROSEMIDE 10 MG/ML IJ SOLN
40.0000 mg | Freq: Once | INTRAMUSCULAR | Status: DC
  Filled 2024-01-13: qty 4

## 2024-01-13 MED ORDER — ALBUTEROL SULFATE HFA 108 (90 BASE) MCG/ACT IN AERS: 2.0000 | INHALATION_SPRAY | Freq: Four times a day (QID) | RESPIRATORY_TRACT | Status: DC | PRN

## 2024-01-13 MED ORDER — INSULIN ASPART 100 UNIT/ML IJ SOLN
0.0000 [IU] | Freq: Every day | INTRAMUSCULAR | Status: DC
  Administered 2024-01-14: 2 [IU] via SUBCUTANEOUS
  Administered 2024-01-15 – 2024-01-16 (×2): 3 [IU] via SUBCUTANEOUS
  Administered 2024-01-17: 4 [IU] via SUBCUTANEOUS
  Administered 2024-01-18 – 2024-01-24 (×4): 3 [IU] via SUBCUTANEOUS
  Administered 2024-01-26: 2 [IU] via SUBCUTANEOUS

## 2024-01-13 MED ORDER — PANTOPRAZOLE SODIUM 40 MG PO TBEC
40.0000 mg | DELAYED_RELEASE_TABLET | Freq: Every day | ORAL | Status: DC
  Administered 2024-01-14 – 2024-01-28 (×15): 40 mg via ORAL
  Filled 2024-01-13 (×15): qty 1

## 2024-01-13 MED ORDER — INSULIN ASPART 100 UNIT/ML IJ SOLN
0.0000 [IU] | Freq: Three times a day (TID) | INTRAMUSCULAR | Status: DC
  Administered 2024-01-14: 5 [IU] via SUBCUTANEOUS
  Administered 2024-01-14 (×2): 2 [IU] via SUBCUTANEOUS
  Administered 2024-01-15 (×2): 3 [IU] via SUBCUTANEOUS
  Administered 2024-01-15: 5 [IU] via SUBCUTANEOUS
  Administered 2024-01-16 (×2): 3 [IU] via SUBCUTANEOUS
  Administered 2024-01-16 – 2024-01-17 (×2): 5 [IU] via SUBCUTANEOUS
  Administered 2024-01-17 (×2): 8 [IU] via SUBCUTANEOUS
  Administered 2024-01-18: 5 [IU] via SUBCUTANEOUS
  Administered 2024-01-18: 2 [IU] via SUBCUTANEOUS
  Administered 2024-01-18: 8 [IU] via SUBCUTANEOUS
  Administered 2024-01-19: 5 [IU] via SUBCUTANEOUS
  Administered 2024-01-19 – 2024-01-20 (×3): 8 [IU] via SUBCUTANEOUS
  Administered 2024-01-20: 3 [IU] via SUBCUTANEOUS
  Administered 2024-01-20: 5 [IU] via SUBCUTANEOUS
  Administered 2024-01-21: 11 [IU] via SUBCUTANEOUS
  Administered 2024-01-21: 3 [IU] via SUBCUTANEOUS
  Administered 2024-01-21 – 2024-01-22 (×2): 5 [IU] via SUBCUTANEOUS
  Administered 2024-01-22: 3 [IU] via SUBCUTANEOUS
  Administered 2024-01-22: 8 [IU] via SUBCUTANEOUS
  Administered 2024-01-23: 5 [IU] via SUBCUTANEOUS
  Administered 2024-01-23: 3 [IU] via SUBCUTANEOUS
  Administered 2024-01-23 – 2024-01-24 (×2): 2 [IU] via SUBCUTANEOUS
  Administered 2024-01-24: 3 [IU] via SUBCUTANEOUS
  Administered 2024-01-25: 8 [IU] via SUBCUTANEOUS
  Administered 2024-01-25: 3 [IU] via SUBCUTANEOUS
  Administered 2024-01-25 – 2024-01-26 (×3): 2 [IU] via SUBCUTANEOUS
  Administered 2024-01-26: 3 [IU] via SUBCUTANEOUS
  Administered 2024-01-27: 5 [IU] via SUBCUTANEOUS

## 2024-01-13 MED ORDER — BASAGLAR KWIKPEN 100 UNIT/ML ~~LOC~~ SOPN
20.0000 [IU] | PEN_INJECTOR | Freq: Every day | SUBCUTANEOUS | Status: DC
Start: 2024-01-13 — End: 2024-01-14
  Filled 2024-01-13: qty 3

## 2024-01-13 MED ORDER — SODIUM CHLORIDE 0.9 % WEIGHT BASED INFUSION: 1.0000 mL/kg/h | INTRAVENOUS | Status: DC

## 2024-01-13 MED ORDER — ASPIRIN 81 MG PO CHEW
81.0000 mg | CHEWABLE_TABLET | ORAL | Status: DC
Start: 2024-01-14 — End: 2024-01-13

## 2024-01-13 MED ORDER — SODIUM CHLORIDE 0.9 % IV SOLN
100.0000 mg | Freq: Two times a day (BID) | INTRAVENOUS | Status: DC
  Administered 2024-01-13 – 2024-01-14 (×2): 100 mg via INTRAVENOUS
  Filled 2024-01-13 (×5): qty 100

## 2024-01-13 MED ORDER — ALBUTEROL SULFATE (2.5 MG/3ML) 0.083% IN NEBU
2.5000 mg | INHALATION_SOLUTION | Freq: Four times a day (QID) | RESPIRATORY_TRACT | Status: DC | PRN
  Administered 2024-01-16: 2.5 mg via RESPIRATORY_TRACT
  Filled 2024-01-13: qty 3

## 2024-01-13 NOTE — ED Notes (Signed)
 ED TO INPATIENT HANDOFF REPORT  ED Nurse Name and Phone #: Anila Bojarski RN  S Name/Age/Gender Lucas Dudley 83 y.o. male Room/Bed: APA07/APA07  Code Status   Code Status: Prior  Home/SNF/Other Home Patient oriented to: self, place, time, and situation Is this baseline? Yes   Triage Complete: Triage complete  Chief Complaint Acute hypoxic respiratory failure (HCC) [J96.01]  Triage Note Pt sent down from cancer center for low oxygen reading. Per cancer center pt's oxygen saturation 79% RA, per cancer center patient was informed to deep breath and oxygen reading went as high as lows 90s. During triage pt placed on 2L of oxygen, oxygen saturation 98%.    Allergies Allergies  Allergen Reactions   Azithromycin Nausea Only   Tape Rash and Other (See Comments)    Causes skin redness, Use paper tape only.    Level of Care/Admitting Diagnosis ED Disposition     ED Disposition  Admit   Condition  --   Comment  Hospital Area: [facility] [[id number]]  Level of Care: Telemetry [5]  Covid Evaluation: Asymptomatic - no recent exposure (last 10 days) testing not required  Diagnosis: Acute hypoxic respiratory failure [facility]) [[id number]]  Admitting Physician: Cresenciano Lick  Attending Physician: Onnie Boer [phone]  Certification:: I certify this patient will need inpatient services for at least 2 midnights  Expected Medical Readiness: 01/15/2024          B Medical/Surgery History Past Medical History:  Diagnosis Date   Arthritis    Atrial fibrillation (HCC)    CAD (coronary artery disease)    a. Cath 03/17/15 showing 100% ostial D1, 50% prox LAD to mid LAD, 40% RPDA stenosis. Med rx. // Myoview 01/2020: EF 31 diffuse perfusion defect without reversibility (suspect artifact); reviewed with Dr. Tama High study felt to be low risk   Cataract    Mixed form OD   Chronic diastolic CHF (congestive heart failure) (HCC)    Chronically elevated hemidiaphragm  - Right Side    Diabetic retinopathy (HCC)    NPDR OU   Essential hypertension    Glaucoma    POAG OU   History of gout    Hyperlipidemia    Hypertensive retinopathy    OU   Kidney stones    Melanoma of neck (HCC)    NICM (nonischemic cardiomyopathy) (HCC)    OSA on CPAP 2012   Prostate cancer (HCC)    Type II diabetes mellitus (HCC)    Past Surgical History:  Procedure Laterality Date   ABDOMINAL HERNIA REPAIR     w/mesh   ATRIAL FIBRILLATION ABLATION N/A 06/06/2021   Procedure: ATRIAL FIBRILLATION ABLATION;  Surgeon: Hillis Range, MD;  [facility]: [facility] IN[facility] CV LAB;  Service: Cardiovascular;  Laterality: N/A;   BIOPSY  05/24/2022   Procedure: BIOPSY;  Surgeon: Corbin Ade, MD;  [facility];  Service: Endoscopy;;   BIOPSY  05/30/2022   Procedure: BIOPSY;  Surgeon: Corbin Ade, MD;  [facility];  Service: Endoscopy;;   BIOPSY  01/10/2023   Procedure: BIOPSY;  Surgeon: Lemar Lofty., MD;  Location: [facility] ENDOSCOPY;  Service: Gastroenterology;;   CARDIAC CATHETERIZATION N/A 03/17/2015   Procedure: Left Heart Cath and Coronary Angiography;  Surgeon: Runell Gess, MD;  Location: [facility] INVASIVE CV LAB;  Service: Cardiovascular;  Laterality: N/A;   CARDIOVERSION N/A 08/09/2021   Procedure: CARDIOVERSION;  Surgeon: Chilton Si, MD;  Location: [facility] ENDOSCOPY;  Service: Cardiovascular;  Laterality: N/A;  CARDIOVERSION N/A 11/10/2021   Procedure: CARDIOVERSION;  Surgeon: Thomasene Ripple, DO;  Location: [facility] ENDOSCOPY;  Service: Cardiovascular;  Laterality: N/A;   carotid doppler  09/17/2008   rigt and left ICAs 0-49%;mildly  abnormal   CATARACT EXTRACTION Left 2020   Dr. Alben Spittle   COLONOSCOPY N/A 05/16/2017   Procedure: COLONOSCOPY;  Surgeon: Malissa Hippo, MD;  [facility];  Service: Endoscopy;  Laterality: N/A;  930   COLONOSCOPY WITH PROPOFOL N/A 05/24/2022   Procedure: COLONOSCOPY WITH PROPOFOL;  Surgeon: Corbin Ade, MD;  [facility];  Service: Endoscopy;  Laterality: N/A;   DOPPLER ECHOCARDIOGRAPHY  05/25/2009   EF 50-55%,LA mildly dilated, LV function normal   ELECTROPHYSIOLOGIC STUDY N/A 04/05/2015   Procedure: Cardioversion;  Surgeon: Thurmon Fair, MD;  Location: [facility] CV LAB;  Service: Cardiovascular;  Laterality: N/A;   ELECTROPHYSIOLOGIC STUDY N/A 09/06/2015   Procedure: Atrial Fibrillation Ablation;  Surgeon: Hillis Range, MD;  Location: [facility] INVASIVE CV LAB;  Service: Cardiovascular;  Laterality: N/A;   ELECTROPHYSIOLOGIC STUDY N/A 07/12/2016   redo afib ablation by Dr Johney Frame   ENTEROSCOPY N/A 05/30/2022   Procedure: ENTEROSCOPY;  Surgeon: Corbin Ade, MD;  [facility];  Service: Endoscopy;  Laterality: N/A;   ENTEROSCOPY N/A 01/07/2024   Procedure: ENTEROSCOPY;  Surgeon: Dolores Frame, MD;  [facility];  Service: Gastroenterology;  Laterality: N/A;  1:45PM;ASA 3   ESOPHAGOGASTRODUODENOSCOPY (EGD) WITH PROPOFOL N/A 05/24/2022   Procedure: ESOPHAGOGASTRODUODENOSCOPY (EGD) WITH PROPOFOL;  Surgeon: Corbin Ade, MD;  [facility];  Service: Endoscopy;  Laterality: N/A;   ESOPHAGOGASTRODUODENOSCOPY (EGD) WITH PROPOFOL N/A 01/10/2023   Procedure: ESOPHAGOGASTRODUODENOSCOPY (EGD) WITH PROPOFOL;  Surgeon: Meridee Score Netty Starring., MD;  Location: [facility] ENDOSCOPY;  Service: Gastroenterology;  Laterality: N/A;   EUS N/A 01/10/2023   Procedure: UPPER ENDOSCOPIC ULTRASOUND (EUS) RADIAL;  Surgeon: Lemar Lofty., MD;  Location: [facility] ENDOSCOPY;  Service: Gastroenterology;  Laterality: N/A;   EXCISIONAL HEMORRHOIDECTOMY     "inside and out"   EYE SURGERY Left 2020   Cat Sx - Dr. Alben Spittle   FINE NEEDLE ASPIRATION Right    knee; "drew ~ 1 quart off"   GIVENS CAPSULE STUDY N/A 05/25/2022   Procedure: GIVENS CAPSULE STUDY;  Surgeon: Lanelle Bal, DO;  [facility];  Service: Endoscopy;  Laterality: N/A;   GIVENS CAPSULE STUDY N/A 01/23/2023    Procedure: GIVENS CAPSULE STUDY;  Surgeon: Dolores Frame, MD;  [facility];  Service: Gastroenterology;  Laterality: N/A;  8:30am   HERNIA REPAIR     HOT HEMOSTASIS N/A 01/10/2023   Procedure: HOT HEMOSTASIS (ARGON PLASMA COAGULATION/BICAP);  Surgeon: Lemar Lofty., MD;  Location: [facility] ENDOSCOPY;  Service: Gastroenterology;  Laterality: N/A;   HOT HEMOSTASIS  01/07/2024   Procedure: EGD, WITH ARGON PLASMA COAGULATION;  Surgeon: Dolores Frame, MD;  [facility];  Service: Gastroenterology;;   LAPAROSCOPIC CHOLECYSTECTOMY     LEFT ATRIAL APPENDAGE OCCLUSION N/A 06/28/2022   Procedure: LEFT ATRIAL APPENDAGE OCCLUSION;  Surgeon: Tonny Bollman, MD;  Location: [facility] INVASIVE CV LAB;  Service: Cardiovascular;  Laterality: N/A;   MELANOMA EXCISION Right    "neck"   NM MYOCAR PERF WALL MOTION  02/21/2012   EF 61% ,EXERCISE 7 METS. exercise stopped due to wheezing and shortness of breathe   POLYPECTOMY  05/16/2017   Procedure: POLYPECTOMY;  Surgeon: Malissa Hippo, MD;  [facility];  Service: Endoscopy;;  colon   POLYPECTOMY  05/24/2022   Procedure: POLYPECTOMY;  Surgeon: Corbin Ade, MD;  [facility];  Service: Endoscopy;;   POLYPECTOMY  01/10/2023   Procedure: POLYPECTOMY;  Surgeon: Meridee Score Netty Starring., MD;  Location: [facility];  Service: Gastroenterology;;   PROSTATECTOMY     SHOULDER OPEN ROTATOR CUFF REPAIR Right X 2   SUBMUCOSAL TATTOO INJECTION  05/30/2022   Procedure: SUBMUCOSAL TATTOO INJECTION;  Surgeon: Corbin Ade, MD;  [facility];  Service: Endoscopy;;   SUBMUCOSAL TATTOO INJECTION  01/10/2023   Procedure: SUBMUCOSAL TATTOO INJECTION;  Surgeon: Lemar Lofty., MD;  Location: [facility];  Service: Gastroenterology;;   TEE WITHOUT CARDIOVERSION N/A 09/05/2015   Procedure: TRANSESOPHAGEAL ECHOCARDIOGRAM (TEE);  Surgeon: Quintella Reichert, MD;  Location: [facility] ENDOSCOPY;  Service:  Cardiovascular;  Laterality: N/A;   TEE WITHOUT CARDIOVERSION N/A 06/28/2022   Procedure: TRANSESOPHAGEAL ECHOCARDIOGRAM (TEE);  Surgeon: Tonny Bollman, MD;  Location: [facility] INVASIVE CV LAB;  Service: Cardiovascular;  Laterality: N/A;     A IV Location/Drains/Wounds Patient Lines/Drains/Airways Status     Active Line/Drains/Airways     Name Placement date Placement time Site Days   Peripheral IV 01/13/24 20 G 1" Posterior;Right Forearm 01/13/24  1610  Forearm  less than 1            Intake/Output Last 24 hours No intake or output data in the 24 hours ending 01/13/24 1852  Labs/Imaging Results for orders placed or performed during the hospital encounter of 01/13/24 (from the past 48 hours)  Comprehensive metabolic panel     Status: Abnormal   Collection Time: 01/13/24  4:09 PM  Result Value Ref Range   Sodium 135 135 - 145 mmol/L   Potassium 3.7 3.5 - 5.1 mmol/L   Chloride 94 (L) 98 - 111 mmol/L   CO2 28 22 - 32 mmol/L   Glucose, Bld 235 (H) 70 - 99 mg/dL    Comment: Glucose reference range applies only to samples taken after fasting for at least 8 hours.   BUN 32 (H) 8 - 23 mg/dL   Creatinine, Ser 8.11 (H) 0.61 - 1.24 mg/dL   Calcium 8.7 (L) 8.9 - 10.3 mg/dL   Total Protein 7.5 6.5 - 8.1 g/dL   Albumin 3.6 3.5 - 5.0 g/dL   AST 28 15 - 41 U/L   ALT 20 0 - 44 U/L   Alkaline Phosphatase 52 38 - 126 U/L   Total Bilirubin 0.7 0.0 - 1.2 mg/dL   GFR, Estimated 43 (L) >60 mL/min    Comment: (NOTE) Calculated using the CKD-EPI Creatinine Equation (2021)    Anion gap 13 5 - 15    Comment: Performed at [facility], [address] [place], [place] [address]  CBC with Differential     Status: Abnormal   Collection Time: 01/13/24  4:09 PM  Result Value Ref Range   WBC 8.7 4.0 - 10.5 K/uL   RBC 2.93 (L) 4.22 - 5.81 MIL/uL   Hemoglobin 8.8 (L) 13.0 - 17.0 g/dL   HCT 29.5 (L) 62.1 - 30.8 %   MCV 97.6 80.0 - 100.0 fL   MCH 30.0 26.0 - 34.0 pg   MCHC 30.8 30.0 - 36.0 g/dL   RDW  65.7 (H) 84.6 - 15.5 %   Platelets 235 150 - 400 K/uL   nRBC 0.0 0.0 - 0.2 %   Neutrophils Relative % 83 %   Neutro Abs 7.1 1.7 - 7.7 K/uL  Lymphocytes Relative 9 %   Lymphs Abs 0.8 0.7 - 4.0 K/uL   Monocytes Relative 8 %   Monocytes Absolute 0.7 0.1 - 1.0 K/uL   Eosinophils Relative 0 %   Eosinophils Absolute 0.0 0.0 - 0.5 K/uL   Basophils Relative 0 %   Basophils Absolute 0.0 0.0 - 0.1 K/uL   Immature Granulocytes 0 %   Abs Immature Granulocytes 0.03 0.00 - 0.07 K/uL    Comment: Performed at [facility], [address], [place], [place] [address]  Troponin I (High Sensitivity)     Status: Abnormal   Collection Time: 01/13/24  4:09 PM  Result Value Ref Range   Troponin I (High Sensitivity) 31 (H) <18 ng/L    Comment: (NOTE) Elevated high sensitivity troponin I (hsTnI) values and significant  changes across serial measurements may suggest ACS but many other  chronic and acute conditions are known to elevate hsTnI results.  Refer to the "Links" section for chest pain algorithms and additional  guidance. Performed at [facility], [address] [place], [place] [address]   Troponin I (High Sensitivity)     Status: Abnormal   Collection Time: 01/13/24  5:39 PM  Result Value Ref Range   Troponin I (High Sensitivity) 34 (H) <18 ng/L    Comment: (NOTE) Elevated high sensitivity troponin I (hsTnI) values and significant  changes across serial measurements may suggest ACS but many other  chronic and acute conditions are known to elevate hsTnI results.  Refer to the "Links" section for chest pain algorithms and additional  guidance. Performed at [facility], [address], [place], [place] [address]    DG Chest Port 1 View Result Date: 01/13/2024 CLINICAL DATA:  Shortness of breath. EXAM: PORTABLE CHEST 1 VIEW COMPARISON:  Chest radiograph dated 01/02/2024. FINDINGS: Shallow inspiration. Left lung base density, progressed since the prior radiograph and may represent atelectasis or  infiltrate. A small left pleural effusion is not excluded no pneumothorax. Stable cardiac silhouette. No acute osseous pathology. IMPRESSION: Left lung base atelectasis or infiltrate. Electronically Signed   By: Elgie Collard M.D.   On: 01/13/2024 16:33    Pending Labs Unresulted Labs (From admission, onward)     Start     Ordered   01/13/24 1551  Brain natriuretic peptide  Once,   URGENT        01/13/24 1550            Vitals/Pain Today's Vitals   01/13/24 1535 01/13/24 1539  BP:  (!) 154/76  Pulse:  (!) 56  Resp:  18  Temp:  98 F (36.7 C)  TempSrc:  Oral  SpO2:  96%  Weight: 251 lb 8.7 oz (114.1 kg)   Height: 5\' 10"  (1.778 m)   PainSc: 0-No pain     Isolation Precautions No active isolations  Medications Medications  furosemide (LASIX) injection 40 mg (40 mg Intravenous Given 01/13/24 1610)    Mobility walks     Focused Assessments Cardiac Assessment Handoff:  Cardiac Rhythm: Sinus bradycardia Lab Results  Component Value Date   TROPONINI 0.03 (HH) 05/08/2016   Lab Results  Component Value Date   DDIMER <0.27 08/14/2021   Does the Patient currently have chest pain? No    R Recommendations: See Admitting Provider Note  Report given to:   Additional Notes:  Pt was at cancer center and sent over for shob and low O2.  PT placed on 2L [place] and sats improved from 79 to 98.  Pt  received lasix, (hx of CHF) and has put out 600 mL so far.  Pt is scheduled to have [facility] tomorrow and concerned he won't get to have that done if here.  ED doc is speaking to him now about that as they consulted cards.  20G right forearm. No meds going now

## 2024-01-13 NOTE — Assessment & Plan Note (Addendum)
 O2 sats ranging from 84 to 90% on room air.  Currently on 2 L sats 86%.  Reports dyspnea on exertion, chronic lower extremity swelling.  Chest x-ray with left lung base atelectasis or infiltrate. -

## 2024-01-13 NOTE — ED Provider Notes (Signed)
 [place] EMERGENCY DEPARTMENT AT [facility] Provider Note  CSN: [id number] Arrival date & time: 01/13/24 1526  Chief Complaint(s) Low Oxygen  HPI Lucas Dudley is a 83 y.o. male history of coronary artery disease, CHF, hypertension, hyperlipidemia presenting to the emergency department shortness of breath.  Patient reports he has some chronic shortness of breath, not on home oxygen.  He reports that over the weekend he had worsening shortness of breath.  Reports associated orthopnea.  He reports chronic leg swelling, possibly worse than normal.  He reports he is compliant with all of his medications.  No fevers or chills.  No cough.  No chest pain.  No runny nose or sore throat.  Patient also has chronic anemia, went to the hematology office today, found to be hypoxic on room air and sent to the ER.  Does have left heart cath planned for tomorrow    Past Medical History Past Medical History:  Diagnosis Date   Arthritis    Atrial fibrillation (HCC)    CAD (coronary artery disease)    a. Cath 03/17/15 showing 100% ostial D1, 50% prox LAD to mid LAD, 40% RPDA stenosis. Med rx. // Myoview 01/2020: EF 31 diffuse perfusion defect without reversibility (suspect artifact); reviewed with Dr. Tama High study felt to be low risk   Cataract    Mixed form OD   Chronic diastolic CHF (congestive heart failure) (HCC)    Chronically elevated hemidiaphragm - Right Side    Diabetic retinopathy (HCC)    NPDR OU   Essential hypertension    Glaucoma    POAG OU   History of gout    Hyperlipidemia    Hypertensive retinopathy    OU   Kidney stones    Melanoma of neck (HCC)    NICM (nonischemic cardiomyopathy) (HCC)    OSA on CPAP 2012   Prostate cancer (HCC)    Type II diabetes mellitus (HCC)    Patient Active Problem List   Diagnosis Date Noted   Chronic combined systolic and diastolic CHF (congestive heart failure) (HCC) - dry Weight 109 kg/240.3 lbs. dry weight BNP of 111 12/17/2023    CKD stage 3a, GFR 45-59 ml/min (HCC) - baseline Scr 1.2-1.4 12/17/2023   Acute hypoxic respiratory failure (HCC) 12/14/2023   Hypokalemia 12/14/2023   Pulmonary nodules 12/14/2023   Diabetic neuropathy (HCC) 11/15/2023   Acute bronchitis 11/11/2023   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   Encounter for general adult medical examination with abnormal findings 08/08/2023   Statin myopathy 07/18/2023   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, associated with type 2 diabetes mellitus (HCC) 07/18/2023   Allergic rhinitis 07/18/2023   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 06-28-2022 06/28/2022   Anemia due to chronic blood loss 06/08/2022   AVM (arteriovenous malformation) of small bowel, acquired    Obesity, Class II, BMI 35-39.9 05/22/2022   Constipation    Secondary hypercoagulable state (HCC) 09/18/2021   History of colonic polyps 01/22/2017   Family hx of colorectal cancer 01/22/2017   Acute on chronic combined systolic and diastolic CHF (congestive heart failure) (HCC) - dry Weight 109 kg/240.3 lbs. dry weight BNP of 111 05/17/2016   CAD in native artery 05/17/2016   Cardiomyopathy, dilated, nonischemic (HCC) - dry Weight 109 kg/240.3 lbs. dry weight BNP of 111 05/04/2015   OSA on CPAP 04/25/2015   Chronic anticoagulation    Shortness of breath 01/18/2015   Paroxysmal atrial fibrillation (  HCC) 07/06/2014   Calculus of kidney 06/14/2014   Type 2 diabetes mellitus with other specified complication (HCC) 06/14/2014   CA of prostate (HCC) 06/14/2014   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/14/2014   Essential hypertension 07/10/2013   Hyperlipidemia 07/10/2013   Home Medication(s) Prior to Admission medications   Medication Sig Start Date End Date Taking? Authorizing Provider  acetaminophen (TYLENOL) 650 MG CR tablet Take 1,300 mg by mouth at bedtime.    [provider]  albuterol (VENTOLIN HFA)  108 (90 Base) MCG/ACT inhaler Inhale 2 puffs into the lungs every 6 (six) hours as needed for wheezing or shortness of breath.    [provider]  amiodarone (PACERONE) 200 MG tablet TAKE 1 TABLET BY MOUTH EVERY DAY 01/06/24   Jonelle Sidle, MD  ammonium lactate (AMLACTIN DAILY) 12 % lotion Apply 1 Application topically as needed. 11/22/23   Candelaria Stagers, DPM  amoxicillin-clavulanate (AUGMENTIN) 875-125 MG tablet Take 1 tablet by mouth 2 (two) times daily. 12/31/23   Anabel Halon, MD  Artificial Tear Ointment (DRY EYES OP) Place 1 drop into both eyes daily as needed (Dry eye).    [provider]  aspirin 81 MG chewable tablet Chew 81 mg by mouth daily.    [provider]  camphor-menthol (ANTI-ITCH) lotion Apply 1 Application topically daily as needed for itching.    [provider]  COMBIGAN 0.2-0.5 % ophthalmic solution Place 1 drop into both eyes 2 (two) times daily. 10/16/21   [provider]  ELDERBERRY PO Take 2 tablets by mouth daily. Chewable    [provider]  ferrous sulfate 325 (65 FE) MG tablet Take 1 tablet (325 mg total) by mouth daily with breakfast. 09/25/22   Buford Dresser, Lupe Carney M, PA-C  fluticasone (FLONASE) 50 MCG/ACT nasal spray SPRAY 2 SPRAYS INTO EACH NOSTRIL EVERY DAY 08/12/23   Anabel Halon, MD  gemfibrozil (LOPID) 600 MG tablet Take 600 mg by mouth at bedtime.     [provider]  glimepiride (AMARYL) 2 MG tablet Take 1 tablet (2 mg total) by mouth 2 (two) times daily with a meal. Patient taking differently: Take 2 mg by mouth daily with breakfast. 08/08/23   Anabel Halon, MD  hydrALAZINE (APRESOLINE) 10 MG tablet Take 2 tablets (20 mg total) by mouth 3 (three) times daily. 01/02/24   Jonelle Sidle, MD  Insulin Glargine [facility]) 100 UNIT/ML Inject 45 Units into the skin at bedtime. Patient taking differently: Inject 38 Units into the skin at bedtime. 08/08/23   Anabel Halon, MD   magnesium gluconate (MAGONATE) 500 MG tablet Take 500 mg by mouth daily.    [provider]  metFORMIN (GLUCOPHAGE-XR) 500 MG 24 hr tablet Take 1 tablet (500 mg total) by mouth 2 (two) times daily with a meal. 12/31/23   Allena Katz, Earlie Lou, MD  mineral oil liquid Take 15 mLs by mouth daily as needed for moderate constipation.    [provider]  OVER THE COUNTER MEDICATION Take 1 tablet by mouth daily. Beets Chew    [provider]  pantoprazole (PROTONIX) 40 MG tablet TAKE 1 TABLET BY MOUTH EVERY DAY 07/08/23   Carlan, Chelsea L, NP  potassium chloride (KLOR-CON) 10 MEQ tablet Take 1 tablet (10 mEq total) by mouth daily. 12/04/23 03/03/24  Jonelle Sidle, MD  torsemide (DEMADEX) 20 MG tablet 40 Mg in the morning and 20 Mg in the evening 12/18/23   Carollee Herter,  DO  zinc gluconate 50 MG tablet Take 50 mg by mouth daily.    [provider]                                                                                                                                    Past Surgical History Past Surgical History:  Procedure Laterality Date   ABDOMINAL HERNIA REPAIR     w/mesh   ATRIAL FIBRILLATION ABLATION N/A 06/06/2021   Procedure: ATRIAL FIBRILLATION ABLATION;  Surgeon: Hillis Range, MD;  Location: [facility] INVASIVE CV LAB;  Service: Cardiovascular;  Laterality: N/A;   BIOPSY  05/24/2022   Procedure: BIOPSY;  Surgeon: Corbin Ade, MD;  Location: [facility] ENDO SUITE;  Service: Endoscopy;;   BIOPSY  05/30/2022   Procedure: BIOPSY;  Surgeon: Corbin Ade, MD;  Location: [facility];  Service: Endoscopy;;   BIOPSY  01/10/2023   Procedure: BIOPSY;  Surgeon: Lemar Lofty., MD;  Location: [facility] ENDOSCOPY;  Service: Gastroenterology;;   CARDIAC CATHETERIZATION N/A 03/17/2015   Procedure: Left Heart Cath and Coronary Angiography;  Surgeon: Runell Gess, MD;  Location: [facility] INVASIVE CV LAB;  Service: Cardiovascular;  Laterality: N/A;   CARDIOVERSION N/A 08/09/2021    Procedure: CARDIOVERSION;  Surgeon: Chilton Si, MD;  Location: [facility] ENDOSCOPY;  Service: Cardiovascular;  Laterality: N/A;   CARDIOVERSION N/A 11/10/2021   Procedure: CARDIOVERSION;  Surgeon: Thomasene Ripple, DO;  Location: [facility] ENDOSCOPY;  Service: Cardiovascular;  Laterality: N/A;   carotid doppler  09/17/2008   rigt and left ICAs 0-49%;mildly  abnormal   CATARACT EXTRACTION Left 2020   Dr. Alben Spittle   COLONOSCOPY N/A 05/16/2017   Procedure: COLONOSCOPY;  Surgeon: Malissa Hippo, MD;  Location: [facility] ENDO [facility];  Service: Endoscopy;  Laterality: N/A;  930   COLONOSCOPY WITH PROPOFOL N/A 05/24/2022   Procedure: COLONOSCOPY WITH PROPOFOL;  Surgeon: Corbin Ade, MD;  Location: [facility];  Service: Endoscopy;  Laterality: N/A;   DOPPLER ECHOCARDIOGRAPHY  05/25/2009   EF 50-55%,LA mildly dilated, LV function normal   ELECTROPHYSIOLOGIC STUDY N/A 04/05/2015   Procedure: Cardioversion;  Surgeon: Thurmon Fair, MD;  Location: [facility] INVASIVE CV LAB;  Service: Cardiovascular;  Laterality: N/A;   ELECTROPHYSIOLOGIC STUDY N/A 09/06/2015   Procedure: Atrial Fibrillation Ablation;  Surgeon: Hillis Range, MD;  Location: [facility] INVASIVE CV LAB;  Service: Cardiovascular;  Laterality: N/A;   ELECTROPHYSIOLOGIC STUDY N/A 07/12/2016   redo afib ablation by Dr Johney Frame   ENTEROSCOPY N/A 05/30/2022   Procedure: ENTEROSCOPY;  Surgeon: Corbin Ade, MD;  Location: [facility];  Service: Endoscopy;  Laterality: N/A;   ENTEROSCOPY N/A 01/07/2024   Procedure: ENTEROSCOPY;  Surgeon: Dolores Frame, MD;  Location: [facility];  Service: Gastroenterology;  Laterality: N/A;  1:45PM;ASA 3   ESOPHAGOGASTRODUODENOSCOPY (EGD) WITH PROPOFOL N/A 05/24/2022   Procedure: ESOPHAGOGASTRODUODENOSCOPY (EGD) WITH PROPOFOL;  Surgeon: Corbin Ade, MD;  Location: [facility];  Service: Endoscopy;  Laterality: N/A;   ESOPHAGOGASTRODUODENOSCOPY (EGD) WITH PROPOFOL N/A 01/10/2023   Procedure: ESOPHAGOGASTRODUODENOSCOPY (EGD)  WITH PROPOFOL;  Surgeon: Meridee Score Netty Starring., MD;  Location: [facility] ENDOSCOPY;  Service: Gastroenterology;  Laterality: N/A;   EUS N/A 01/10/2023   Procedure: UPPER ENDOSCOPIC ULTRASOUND (EUS) RADIAL;  Surgeon: Lemar Lofty., MD;  Location: [facility] ENDOSCOPY;  Service: Gastroenterology;  Laterality: N/A;   EXCISIONAL HEMORRHOIDECTOMY     "inside and out"   EYE SURGERY Left 2020   Cat Sx - Dr. Alben Spittle   FINE NEEDLE ASPIRATION Right    knee; "drew ~ 1 quart off"   GIVENS CAPSULE STUDY N/A 05/25/2022   Procedure: GIVENS CAPSULE STUDY;  Surgeon: Lanelle Bal, DO;  Location: [facility];  Service: Endoscopy;  Laterality: N/A;   GIVENS CAPSULE STUDY N/A 01/23/2023   Procedure: GIVENS CAPSULE STUDY;  Surgeon: Dolores Frame, MD;  Location: [facility];  Service: Gastroenterology;  Laterality: N/A;  8:30am   HERNIA REPAIR     HOT HEMOSTASIS N/A 01/10/2023   Procedure: HOT HEMOSTASIS (ARGON PLASMA COAGULATION/BICAP);  Surgeon: Lemar Lofty., MD;  Location: [facility] ENDOSCOPY;  Service: Gastroenterology;  Laterality: N/A;   HOT HEMOSTASIS  01/07/2024   Procedure: EGD, WITH ARGON PLASMA COAGULATION;  Surgeon: Dolores Frame, MD;  Location: [facility];  Service: Gastroenterology;;   LAPAROSCOPIC CHOLECYSTECTOMY     LEFT ATRIAL APPENDAGE OCCLUSION N/A 06/28/2022   Procedure: LEFT ATRIAL APPENDAGE OCCLUSION;  Surgeon: Tonny Bollman, MD;  Location: [facility] INVASIVE CV LAB;  Service: Cardiovascular;  Laterality: N/A;   MELANOMA EXCISION Right    "neck"   NM MYOCAR PERF WALL MOTION  02/21/2012   EF 61% ,EXERCISE 7 METS. exercise stopped due to wheezing and shortness of breathe   POLYPECTOMY  05/16/2017   Procedure: POLYPECTOMY;  Surgeon: Malissa Hippo, MD;  [facility]: [facility];  Service: Endoscopy;;  colon   POLYPECTOMY  05/24/2022   Procedure: POLYPECTOMY;  Surgeon: Corbin Ade, MD;  [facility]: [facility];  Service: Endoscopy;;   POLYPECTOMY  01/10/2023    Procedure: POLYPECTOMY;  Surgeon: Meridee Score Netty Starring., MD;  Location: [facility] ENDOSCOPY;  Service: Gastroenterology;;   PROSTATECTOMY     SHOULDER OPEN ROTATOR CUFF REPAIR Right X 2   SUBMUCOSAL TATTOO INJECTION  05/30/2022   Procedure: SUBMUCOSAL TATTOO INJECTION;  Surgeon: Corbin Ade, MD;  Location: [facility];  Service: Endoscopy;;   SUBMUCOSAL TATTOO INJECTION  01/10/2023   Procedure: SUBMUCOSAL TATTOO INJECTION;  Surgeon: Lemar Lofty., MD;  Location: [facility] ENDOSCOPY;  Service: Gastroenterology;;   TEE WITHOUT CARDIOVERSION N/A 09/05/2015   Procedure: TRANSESOPHAGEAL ECHOCARDIOGRAM (TEE);  Surgeon: Quintella Reichert, MD;  Location: [facility] ENDOSCOPY;  Service: Cardiovascular;  Laterality: N/A;   TEE WITHOUT CARDIOVERSION N/A 06/28/2022   Procedure: TRANSESOPHAGEAL ECHOCARDIOGRAM (TEE);  Surgeon: Tonny Bollman, MD;  Location: [facility] INVASIVE CV LAB;  Service: Cardiovascular;  Laterality: N/A;   Family History Family History  Problem Relation Age of Onset   Heart disease Mother    Lung cancer Mother    Heart attack Mother 36   Diabetes Father    Heart disease Father    Stroke Brother    Healthy Daughter    Colon cancer Maternal Aunt        62s    Social History Social History   Tobacco Use   Smoking status: Former    Current packs/day: 0.00    Average packs/day: 2.0 packs/day for 27.0 years (54.0 ttl  pk-yrs)    Types: Cigarettes    Start date: 16    Quit date: 1992    Years since quitting: 33.2    Passive exposure: Current   Smokeless tobacco: Never   Tobacco comments:    Former smoker 09/27/21  Vaping Use   Vaping status: Never Used  Substance Use Topics   Alcohol use: No    Alcohol/week: 0.0 standard drinks of alcohol    Comment: "used to drink; stopped ~ 2008"   Drug use: No   Allergies Azithromycin and Tape  Review of Systems Review of Systems  All other systems reviewed and are negative.   Physical Exam Vital Signs  I have reviewed the triage  vital signs BP (!) 154/76 (BP Location: Right Arm)   Pulse (!) 56   Temp 98 F (36.7 C) (Oral)   Resp 18   Ht 5\' 10"  (1.778 m)   Wt 114.1 kg   SpO2 96%   BMI 36.09 kg/m  Physical Exam Vitals and nursing note reviewed.  Constitutional:      General: He is not in acute distress.    Appearance: Normal appearance.  HENT:     Mouth/Throat:     Mouth: Mucous membranes are moist.  Eyes:     Conjunctiva/sclera: Conjunctivae normal.  Neck:     Vascular: JVD present.  Cardiovascular:     Rate and Rhythm: Normal rate and regular rhythm.  Pulmonary:     Comments: Very mild increased work of breathing, crackles bibasilarly more prominent in the left lung base Abdominal:     General: Abdomen is flat.     Palpations: Abdomen is soft.     Tenderness: There is no abdominal tenderness.  Musculoskeletal:     Right lower leg: Edema present.     Left lower leg: Edema present.  Skin:    General: Skin is warm and dry.     Capillary Refill: Capillary refill takes less than 2 seconds.  Neurological:     Mental Status: He is alert and oriented to person, place, and time. Mental status is at baseline.  Psychiatric:        Mood and Affect: Mood normal.        Behavior: Behavior normal.     ED Results and Treatments Labs (all labs ordered are listed, but only abnormal results are displayed) Labs Reviewed  COMPREHENSIVE METABOLIC PANEL WITH GFR - Abnormal; Notable for the following components:      Result Value   Chloride 94 (*)    Glucose, Bld 235 (*)    BUN 32 (*)    Creatinine, Ser 1.59 (*)    Calcium 8.7 (*)    GFR, Estimated 43 (*)    All other components within normal limits  CBC WITH DIFFERENTIAL/PLATELET - Abnormal; Notable for the following components:   RBC 2.93 (*)    Hemoglobin 8.8 (*)    HCT 28.6 (*)    RDW 19.6 (*)    All other components within normal limits  TROPONIN I (HIGH SENSITIVITY) - Abnormal; Notable for the following components:   Troponin I (High  Sensitivity) 31 (*)    All other components within normal limits  TROPONIN I (HIGH SENSITIVITY) - Abnormal; Notable for the following components:   Troponin I (High Sensitivity) 34 (*)    All other components within normal limits  BRAIN NATRIURETIC PEPTIDE  Radiology DG Chest Port 1 View Result Date: 01/13/2024 CLINICAL DATA:  Shortness of breath. EXAM: PORTABLE CHEST 1 VIEW COMPARISON:  Chest radiograph dated 01/02/2024. FINDINGS: Shallow inspiration. Left lung base density, progressed since the prior radiograph and may represent atelectasis or infiltrate. A small left pleural effusion is not excluded no pneumothorax. Stable cardiac silhouette. No acute osseous pathology. IMPRESSION: Left lung base atelectasis or infiltrate. Electronically Signed   By: Elgie Collard M.D.   On: 01/13/2024 16:33    Pertinent labs & imaging results that were available during my care of the patient were reviewed by me and considered in my medical decision making (see MDM for details).  Medications Ordered in ED Medications  furosemide (LASIX) injection 40 mg (40 mg Intravenous Given 01/13/24 1610)                                                                                                                                     Procedures .Critical Care  Performed by: Lonell Grandchild, MD Authorized by: Lonell Grandchild, MD   Critical care provider statement:    Critical care time (minutes):  30   Critical care was necessary to treat or prevent imminent or life-threatening deterioration of the following conditions:  Respiratory failure   Critical care was time spent personally by me on the following activities:  Development of treatment plan with patient or surrogate, discussions with consultants, evaluation of patient's response to treatment, examination of patient, ordering and  review of laboratory studies, ordering and review of radiographic studies, ordering and performing treatments and interventions, pulse oximetry, re-evaluation of patient's condition and review of old charts   Care discussed with: admitting provider     (including critical care time)  Medical Decision Making / ED Course   MDM:  83 year old presenting to the emergency department shortness of breath.  Patient overall well-appearing, found to be hypoxic earlier today at doctor's office.  Suspect likely CHF exacerbation, recent echocardiogram showed diastolic dysfunction, pulmonary hypertension.  Will give diuresis.  He reports he is compliant with his medications.  Denies chest pain will check troponin.  Check EKG.  Doubt pulmonary embolism, no chest pain or pleuritic chest pain, tachycardia.  Doubt pneumonia without cough but will check chest x-ray.  Doubt pneumothorax.  No wheezing to suggest COPD.  Will reassess.  Given hypoxia will likely need admission.  Given patient was planned to have left heart cath tomorrow, possibly discussed with cardiology whether patient needs transfer although do not think he would be able to tolerate this now with his orthopnea and hypoxia.  Clinical Course as of 01/13/24 1839  Mon Jan 13, 2024  1610 Chest x-ray with some atelectasis or infiltrate in the left lobe.  No fevers, no cough, no leukocytosis, seems less consistent with a pneumonia, will defer antibiotics at this time.  Given patient's scheduled cath tomorrow, discussed with Dr. Cristal Deer with cardiology.  She thinks  patient could stay at this hospital.  Given his orthopnea and volume overload it is unlikely he would be able to tolerate catheterization anyway.  His troponin is similar to baseline as EKG does not show any concerning changes, and he also denies chest pain.  Discussed with Dr. Mariea Clonts who will admit patient for CHF [WS]    Clinical Course User Index [WS] Suezanne Jacquet Jerilee Field, MD      Additional history obtained: -Additional history obtained from other healthcare provider at cancer center -External records from outside source obtained and reviewed including: Chart review including previous notes, labs, imaging, consultation notes including prior notes    Lab Tests: -I ordered, reviewed, and interpreted labs.   The pertinent results include:   Labs Reviewed  COMPREHENSIVE METABOLIC PANEL WITH GFR - Abnormal; Notable for the following components:      Result Value   Chloride 94 (*)    Glucose, Bld 235 (*)    BUN 32 (*)    Creatinine, Ser 1.59 (*)    Calcium 8.7 (*)    GFR, Estimated 43 (*)    All other components within normal limits  CBC WITH DIFFERENTIAL/PLATELET - Abnormal; Notable for the following components:   RBC 2.93 (*)    Hemoglobin 8.8 (*)    HCT 28.6 (*)    RDW 19.6 (*)    All other components within normal limits  TROPONIN I (HIGH SENSITIVITY) - Abnormal; Notable for the following components:   Troponin I (High Sensitivity) 31 (*)    All other components within normal limits  TROPONIN I (HIGH SENSITIVITY) - Abnormal; Notable for the following components:   Troponin I (High Sensitivity) 34 (*)    All other components within normal limits  BRAIN NATRIURETIC PEPTIDE    Notable for mild troponin elevation  EKG   EKG Interpretation Date/Time:  Monday January 13 2024 15:33:42 EDT Ventricular Rate:  60 PR Interval:  169 QRS Duration:  146 QT Interval:  477 QTC Calculation: 477 R Axis:   88  Text Interpretation: Sinus or ectopic atrial rhythm Right bundle branch block Confirmed by Alvino Blood ([id number]) on 01/13/2024 5:48:05 PM         Imaging Studies ordered: I ordered imaging studies including CXR On my interpretation imaging demonstrates ?LLL atelectasis  I independently visualized and interpreted imaging. I agree with the radiologist interpretation   Medicines ordered and prescription drug management: Meds ordered this  encounter  Medications   furosemide (LASIX) injection 40 mg    -I have reviewed the patients home medicines and have made adjustments as needed   Consultations Obtained: I requested consultation with the cardiologist,  and discussed lab and imaging findings as well as pertinent plan - they recommend: admission   Cardiac Monitoring: The patient was maintained on a cardiac monitor.  I personally viewed and interpreted the cardiac monitored which showed an underlying rhythm of: NSR  Social Determinants of Health:  Diagnosis or treatment significantly limited by social determinants of health: obesity   Reevaluation: After the interventions noted above, I reevaluated the patient and found that their symptoms have improved  Co morbidities that complicate the patient evaluation  Past Medical History:  Diagnosis Date   Arthritis    Atrial fibrillation (HCC)    CAD (coronary artery disease)    a. Cath 03/17/15 showing 100% ostial D1, 50% prox LAD to mid LAD, 40% RPDA stenosis. Med rx. // Myoview 01/2020: EF 31 diffuse perfusion defect without reversibility (suspect artifact); reviewed with Dr.  Nelson-overall study felt to be low risk   Cataract    Mixed form OD   Chronic diastolic CHF (congestive heart failure) (HCC)    Chronically elevated hemidiaphragm - Right Side    Diabetic retinopathy (HCC)    NPDR OU   Essential hypertension    Glaucoma    POAG OU   History of gout    Hyperlipidemia    Hypertensive retinopathy    OU   Kidney stones    Melanoma of neck (HCC)    NICM (nonischemic cardiomyopathy) (HCC)    OSA on CPAP 2012   Prostate cancer (HCC)    Type II diabetes mellitus (HCC)       Dispostion: Disposition decision including need for hospitalization was considered, and patient admitted to the hospital.    Final Clinical Impression(s) / ED Diagnoses Final diagnoses:  Acute diastolic CHF (congestive heart failure) (HCC)  Acute hypoxic respiratory failure (HCC)      This chart was dictated using voice recognition software.  Despite best efforts to proofread,  errors can occur which can change the documentation meaning.    Lonell Grandchild, MD 01/13/24 [phone]

## 2024-01-13 NOTE — H&P (Signed)
 History and Physical    Lucas Dudley ZOX:[id number] DOB: 06-24-1941 DOA: 01/13/2024  PCP: Anabel Halon, MD   Patient coming from: Home  I have personally briefly reviewed patient's old medical records in [facility] Link  Chief Complaint: Low O2 sats  HPI: Lucas Dudley is a 83 y.o. male with medical history significant for systolic and diastolic CHF, prostate cancer, hypertension, paroxysmal atrial fibrillation, OSA, diabetes mellitus. Patient was sent to the ED from heme-onc office with reports of hypoxia-sats down to 84 to 90% on room air.  She reports over the past few days he has had worsening dyspnea with exertion, which improves with rest.  Reports chronic unchanged cough.  Chronic bilateral lower extremity edema, and he denies abdominal bloating.[name]  He is unaware of any weight gain. He reports compliance with his medications include torsemide 40 mg daily.   Recent hospitalized-3/1 to 3/4 for decompensated CHF, hypoxia.  Weight on discharge was 244.2 pounds.  He was recently seen in cardiology clinic 01/02/2024 with complaints of dyspnea on exertion and fatigue.  Per notes- patient with evidence of HFpEF, likely contributing, and RV dysfunction with evidence of potentially severe pulmonary hypertension by echo.  Also possible progression of his coronary artery disease which was documented 2016, he was scheduled for cardiac cath tomorrow.  ED Course: Temperature 98.  Heart rate 56.  Respiratory 18.  Blood pressure 154/76.  O2 sats 96% on 2 L.  Troponin 31 >> 34.  Creatinine 1.59.  Chest x-ray with left lung base atelectasis or infiltrate. IV Lasix 40 mg x 1 given. EDP talked to cardiology on-call, admitted here for diuresis, no need to transfer patient's, cardiac cath will be rescheduled.  Review of Systems: As per HPI all other systems reviewed and negative.  Past Medical History:  Diagnosis Date   Arthritis    Atrial fibrillation (HCC)    CAD (coronary artery disease)    a.  Cath 03/17/15 showing 100% ostial D1, 50% prox LAD to mid LAD, 40% RPDA stenosis. Med rx. // Myoview 01/2020: EF 31 diffuse perfusion defect without reversibility (suspect artifact); reviewed with Dr. Tama High study felt to be low risk   Cataract    Mixed form OD   Chronic diastolic CHF (congestive heart failure) (HCC)    Chronically elevated hemidiaphragm - Right Side    Diabetic retinopathy (HCC)    NPDR OU   Essential hypertension    Glaucoma    POAG OU   History of gout    Hyperlipidemia    Hypertensive retinopathy    OU   Kidney stones    Melanoma of neck (HCC)    NICM (nonischemic cardiomyopathy) (HCC)    OSA on CPAP 2012   Prostate cancer (HCC)    Type II diabetes mellitus (HCC)     Past Surgical History:  Procedure Laterality Date   ABDOMINAL HERNIA REPAIR     w/mesh   ATRIAL FIBRILLATION ABLATION N/A 06/06/2021   Procedure: ATRIAL FIBRILLATION ABLATION;  Surgeon: Hillis Range, MD;  [facility]: [facility] INVASIVE CV LAB;  Service: Cardiovascular;  Laterality: N/A;   BIOPSY  05/24/2022   Procedure: BIOPSY;  Surgeon: Corbin Ade, MD;  [facility]: [facility] ENDO [facility];  Service: Endoscopy;;   BIOPSY  05/30/2022   Procedure: BIOPSY;  Surgeon: Corbin Ade, MD;  [facility];  Service: Endoscopy;;   BIOPSY  01/10/2023   Procedure: BIOPSY;  Surgeon: Lemar Lofty., MD;  Location: [facility] ENDOSCOPY;  Service: Gastroenterology;;  CARDIAC CATHETERIZATION N/A 03/17/2015   Procedure: Left Heart Cath and Coronary Angiography;  Surgeon: Runell Gess, MD;  Location: [facility] INVASIVE CV LAB;  Service: Cardiovascular;  Laterality: N/A;   CARDIOVERSION N/A 08/09/2021   Procedure: CARDIOVERSION;  Surgeon: Chilton Si, MD;  Location: [facility] ENDOSCOPY;  Service: Cardiovascular;  Laterality: N/A;   CARDIOVERSION N/A 11/10/2021   Procedure: CARDIOVERSION;  Surgeon: Thomasene Ripple, DO;  Location: [facility] ENDOSCOPY;  Service: Cardiovascular;  Laterality: N/A;   carotid doppler  09/17/2008   rigt  and left ICAs 0-49%;mildly  abnormal   CATARACT EXTRACTION Left 2020   Dr. Alben Spittle   COLONOSCOPY N/A 05/16/2017   Procedure: COLONOSCOPY;  Surgeon: Malissa Hippo, MD;  [facility];  Service: Endoscopy;  Laterality: N/A;  930   COLONOSCOPY WITH PROPOFOL N/A 05/24/2022   Procedure: COLONOSCOPY WITH PROPOFOL;  Surgeon: Corbin Ade, MD;  [facility];  Service: Endoscopy;  Laterality: N/A;   DOPPLER ECHOCARDIOGRAPHY  05/25/2009   EF 50-55%,LA mildly dilated, LV function normal   ELECTROPHYSIOLOGIC STUDY N/A 04/05/2015   Procedure: Cardioversion;  Surgeon: Thurmon Fair, MD;  Location: [facility] INVAS[facility] CV LAB;  Service: Cardiovascular;  Laterality: N/A;   ELECTROPHYSIOLOGIC STUDY N/A 09/06/2015   Procedure: Atrial Fibrillation Ablation;  Surgeon: Hillis Range, MD;  Location: [facility] INVASIVE CV LAB;  Service: Cardiovascular;  Laterality: N/A;   ELECTROPHYSIOLOGIC STUDY N/A 07/12/2016   redo afib ablation by Dr Johney Frame   ENTEROSCOPY N/A 05/30/2022   Procedure: ENTEROSCOPY;  Surgeon: Corbin Ade, MD;  [facility];  Service: Endoscopy;  Laterality: N/A;   ENTEROSCOPY N/A 01/07/2024   Procedure: ENTEROSCOPY;  Surgeon: Dolores Frame, MD;  [facility];  Service: Gastroenterology;  Laterality: N/A;  1:45PM;ASA 3   ESOPHAGOGASTRODUODENOSCOPY (EGD) WITH PROPOFOL N/A 05/24/2022   Procedure: ESOPHAGOGASTRODUODENOSCOPY (EGD) WITH PROPOFOL;  Surgeon: Corbin Ade, MD;  [facility];  Service: Endoscopy;  Laterality: N/A;   ESOPHAGOGASTRODUODENOSCOPY (EGD) WITH PROPOFOL N/A 01/10/2023   Procedure: ESOPHAGOGASTRODUODENOSCOPY (EGD) WITH PROPOFOL;  Surgeon: Meridee Score Netty Starring., MD;  Location: [facility] ENDOSCOPY;  Service: Gastroenterology;  Laterality: N/A;   EUS N/A 01/10/2023   Procedure: UPPER ENDOSCOPIC ULTRASOUND (EUS) RADIAL;  Surgeon: Lemar Lofty., MD;  Location: [facility] ENDOSCOPY;  Service: Gastroenterology;  Laterality: N/A;   EXCISIONAL  HEMORRHOIDECTOMY     "inside and out"   EYE SURGERY Left 2020   Cat Sx - Dr. Alben Spittle   FINE NEEDLE ASPIRATION Right    knee; "drew ~ 1 quart off"   GIVENS CAPSULE STUDY N/A 05/25/2022   Procedure: GIVENS CAPSULE STUDY;  Surgeon: Lanelle Bal, DO;  [facility];  Service: Endoscopy;  Laterality: N/A;   GIVENS CAPSULE STUDY N/A 01/23/2023   Procedure: GIVENS CAPSULE STUDY;  Surgeon: Dolores Frame, MD;  [facility];  Service: Gastroenterology;  Laterality: N/A;  8:30am   HERNIA REPAIR     HOT HEMOSTASIS N/A 01/10/2023   Procedure: HOT HEMOSTASIS (ARGON PLASMA COAGULATION/BICAP);  Surgeon: Lemar Lofty., MD;  Location: [facility]OS[facility];  Service: Gastroenterology;  Laterality: N/A;   HOT HEMOSTASIS  01/07/2024   Procedure: EGD, WITH ARGON PLASMA COAGULATION;  Surgeon: Dolores Frame, MD;  [facility];  Service: Gastroenterology;;   LAPAROSCOPIC CHOLECYSTECTOMY     LEFT ATRIAL APPENDAGE OCCLUSION N/A 06/28/2022   Procedure: LEFT ATRIAL APPENDAGE OCCLUSION;  Surgeon: Tonny Bollman, MD;  Location: [facility] INVASIVE CV LAB;  Service: Cardiovascular;  Laterality: N/A;  MELANOMA EXCISION Right    "neck"   NM MYOCAR PERF WALL MOTION  02/21/2012   EF 61% ,EXERCISE 7 METS. exercise stopped due to wheezing and shortness of breathe   POLYPECTOMY  05/16/2017   Procedure: POLYPECTOMY;  Surgeon: Malissa Hippo, MD;  [facility];  Service: Endoscopy;;  colon   POLYPECTOMY  05/24/2022   Procedure: POLYPECTOMY;  Surgeon: Corbin Ade, MD;  [facility];  Service: Endoscopy;;   POLYPECTOMY  01/10/2023   Procedure: POLYPECTOMY;  Surgeon: Meridee Score Netty Starring., MD;  Location: [facility] ENDOSCOPY;  Service: Gastroenterology;;   PROSTATECTOMY     SHOULDER OPEN ROTATOR CUFF REPAIR Right X 2   SUBMUCOSAL TATTOO INJECTION  05/30/2022   Procedure: SUBMUCOSAL TATTOO INJECTION;  Surgeon: Corbin Ade, MD;  [facility];   Service: Endoscopy;;   SUBMUCOSAL TATTOO INJECTION  01/10/2023   Procedure: SUBMUCOSAL TATTOO INJECTION;  Surgeon: Lemar Lofty., MD;  Location: [facility]OS[facility];  Service: Gastroenterology;;   TEE WITHOUT CARDIOVERSION N/A 09/05/2015   Procedure: TRANSESOPHAGEAL ECHOCARDIOGRAM (TEE);  Surgeon: Quintella Reichert, MD;  Location: [facility] ENDOSCOPY;  Service: Cardiovascular;  Laterality: N/A;   TEE WITHOUT CARDIOVERSION N/A 06/28/2022   Procedure: TRANSESOPHAGEAL ECHOCARDIOGRAM (TEE);  Surgeon: Tonny Bollman, MD;  Location: [facility] INVASIVE CV LAB;  Service: Cardiovascular;  Laterality: N/A;     reports that he quit smoking about 33 years ago. His smoking use included cigarettes. He started smoking about 60 years ago. He has a 54 pack-year smoking history. He has been exposed to tobacco smoke. He has never used smokeless tobacco. He reports that he does not drink alcohol and does not use drugs.  Allergies  Allergen Reactions   Azithromycin Nausea Only   Tape Rash and Other (See Comments)    Causes skin redness, Use paper tape only.    Family History  Problem Relation Age of Onset   Heart disease Mother    Lung cancer Mother    Heart attack Mother 40   Diabetes Father    Heart disease Father    Stroke Brother    Healthy Daughter    Colon cancer Maternal Aunt        70s    Prior to Admission medications   Medication Sig Start Date End Date Taking? Authorizing Provider  acetaminophen (TYLENOL) 650 MG CR tablet Take 1,300 mg by mouth at bedtime.    [provider]  albuterol (VENTOLIN HFA) 108 (90 Base) MCG/ACT inhaler Inhale 2 puffs into the lungs every 6 (six) hours as needed for wheezing or shortness of breath.    [provider]  amiodarone (PACERONE) 200 MG tablet TAKE 1 TABLET BY MOUTH EVERY DAY 01/06/24   Jonelle Sidle, MD  ammonium lactate (AMLACTIN DAILY) 12 % lotion Apply 1 Application topically as needed. 11/22/23   Candelaria Stagers, DPM  amoxicillin-clavulanate  (AUGMENTIN) 875-125 MG tablet Take 1 tablet by mouth 2 (two) times daily. 12/31/23   Anabel Halon, MD  Artificial Tear Ointment (DRY EYES OP) Place 1 drop into both eyes daily as needed (Dry eye).    [provider]  aspirin 81 MG chewable tablet Chew 81 mg by mouth daily.    [provider]  camphor-menthol (ANTI-ITCH) lotion Apply 1 Application topically daily as needed for itching.    [provider]  COMBIGAN 0.2-0.5 % ophthalmic solution Place 1 drop into both eyes 2 (two) times daily. 10/16/21   [provider]  Lucila Maine  PO Take 2 tablets by mouth daily. Chewable    [provider]  ferrous sulfate 325 (65 FE) MG tablet Take 1 tablet (325 mg total) by mouth daily with breakfast. 09/25/22   Buford Dresser, Lupe Carney M, PA-C  fluticasone (FLONASE) 50 MCG/ACT nasal spray SPRAY 2 SPRAYS INTO EACH NOSTRIL EVERY DAY 08/12/23   Anabel Halon, MD  gemfibrozil (LOPID) 600 MG tablet Take 600 mg by mouth at bedtime.     [provider]  glimepiride (AMARYL) 2 MG tablet Take 1 tablet (2 mg total) by mouth 2 (two) times daily with a meal. Patient taking differently: Take 2 mg by mouth daily with breakfast. 08/08/23   Anabel Halon, MD  hydrALAZINE (APRESOLINE) 10 MG tablet Take 2 tablets (20 mg total) by mouth 3 (three) times daily. 01/02/24   Jonelle Sidle, MD  Insulin Glargine [facility]) 100 UNIT/ML Inject 45 Units into the skin at bedtime. Patient taking differently: Inject 38 Units into the skin at bedtime. 08/08/23   Anabel Halon, MD  magnesium gluconate (MAGONATE) 500 MG tablet Take 500 mg by mouth daily.    [provider]  metFORMIN (GLUCOPHAGE-XR) 500 MG 24 hr tablet Take 1 tablet (500 mg total) by mouth 2 (two) times daily with a meal. 12/31/23   Allena Katz, Earlie Lou, MD  mineral oil liquid Take 15 mLs by mouth daily as needed for moderate constipation.    [provider]  OVER THE COUNTER MEDICATION Take 1 tablet by  mouth daily. Beets Chew    [provider]  pantoprazole (PROTONIX) 40 MG tablet TAKE 1 TABLET BY MOUTH EVERY DAY 07/08/23   Carlan, Chelsea L, NP  potassium chloride (KLOR-CON) 10 MEQ tablet Take 1 tablet (10 mEq total) by mouth daily. 12/04/23 03/03/24  Jonelle Sidle, MD  torsemide (DEMADEX) 20 MG tablet 40 Mg in the morning and 20 Mg in the evening 12/18/23   Carollee Herter, DO  zinc gluconate 50 MG tablet Take 50 mg by mouth daily.    [provider]    Physical Exam: Vitals:   01/13/24 1535 01/13/24 1539  BP:  (!) 154/76  Pulse:  (!) 56  Resp:  18  Temp:  98 F (36.7 C)  TempSrc:  Oral  SpO2:  96%  Weight: 114.1 kg   Height: 5\' 10"  (1.778 m)     Constitutional: NAD, calm, comfortable Vitals:   01/13/24 1535 01/13/24 1539  BP:  (!) 154/76  Pulse:  (!) 56  Resp:  18  Temp:  98 F (36.7 C)  TempSrc:  Oral  SpO2:  96%  Weight: 114.1 kg   Height: 5\' 10"  (1.778 m)    Eyes: PERRL, lids and conjunctivae normal ENMT: Mucous membranes are moist.   Neck: normal, supple, no masses, no thyromegaly Respiratory: Becomes mildly dyspneic when making long sentences, otherwise normal respiratory effort no accessory muscle use, clear to auscultation bilaterally, no wheezing, no crackles.  Cardiovascular: Regular rate and rhythm, no murmurs / rubs / gallops.  2+ pitting bilateral lower extremity edema to lower third of legs worse on the left   Extremities warm.  Chronic stasis changes. Abdomen: no tenderness, no masses palpated. No hepatosplenomegaly. Bowel sounds positive.  Musculoskeletal: no clubbing / cyanosis. No joint deformity upper and lower extremities.  Skin: no rashes, lesions, ulcers. No induration Neurologic: No facial asymmetry, moving extremities spontaneously, speech fluent. Psychiatric: Normal judgment and insight. Alert and oriented x 3. Normal mood.   Labs on  Admission: I have personally reviewed following labs and imaging studies  CBC: Recent Labs   Lab 01/10/24 1141 01/13/24 1609  WBC 6.3 8.7  NEUTROABS  --  7.1  HGB 9.5* 8.8*  HCT 30.3* 28.6*  MCV 96.5 97.6  PLT 251 235   Basic Metabolic Panel: Recent Labs  Lab 01/10/24 1141 01/13/24 1609  NA 136 135  K 3.9 3.7  CL 94* 94*  CO2 30 28  GLUCOSE 248* 235*  BUN 27* 32*  CREATININE 1.51* 1.59*  CALCIUM 9.1 8.7*   GFR: Estimated Creatinine Clearance: 46.1 mL/min (A) (by C-G formula based on SCr of 1.59 mg/dL (H)). Liver Function Tests: Recent Labs  Lab 01/13/24 1609  AST 28  ALT 20  ALKPHOS 52  BILITOT 0.7  PROT 7.5  ALBUMIN 3.6   CBG: Recent Labs  Lab 01/07/24 1216 01/07/24 1443  GLUCAP 102* 182*   Radiological Exams on Admission: DG Chest Port 1 View Result Date: 01/13/2024 CLINICAL DATA:  Shortness of breath. EXAM: PORTABLE CHEST 1 VIEW COMPARISON:  Chest radiograph dated 01/02/2024. FINDINGS: Shallow inspiration. Left lung base density, progressed since the prior radiograph and may represent atelectasis or infiltrate. A small left pleural effusion is not excluded no pneumothorax. Stable cardiac silhouette. No acute osseous pathology. IMPRESSION: Left lung base atelectasis or infiltrate. Electronically Signed   By: Elgie Collard M.D.   On: 01/13/2024 16:33   EKG: Independently reviewed.  Sinus rhythm, rate 60, Old RBBB.  QTc 477.  No significant change from prior.  Assessment/Plan Principal Problem:   Acute hypoxic respiratory failure (HCC) Active Problems:   Acute on chronic combined systolic and diastolic CHF (congestive heart failure) (HCC) - dry Weight 109 kg/240.3 lbs. dry weight BNP of 111   Essential hypertension   CAD in native artery   Paroxysmal atrial fibrillation (HCC)   Type 2 diabetes mellitus with other specified complication (HCC)   Assessment and Plan: * Acute hypoxic respiratory failure (HCC) O2 sats ranging from 84 to 90% on room air.  Currently on 2 L sats 86%.  Reports dyspnea on exertion, chronic lower extremity swelling.   Chest x-ray with left lung base atelectasis or infiltrate. - Iv Lasix and antibiotics.  Acute on chronic combined systolic and diastolic CHF  Dyspnea on exertion, hypoxia, chronic bilateral lower extremity swelling.  Recent discharge with 244.2 Lbs, today he is weighing 251.5 lbs. Chest x-ray shows left lung base atelectasis or infiltrate progressed since prior radiograph, small left pleural effusion not excluded.  Reports compliance with torsemide 20 - 40 mg twice daily.  Recent echo 12/15/2023- EF 55 to 60%.  -IV Lasix 40 twice daily X 2 doses, further dosing pending clinical course -BNP pending  Pneumonia-chest x-ray shows progressed left lung base density - atelectasis or infiltrate.  He is afebrile without leukocytosis. -Treat empirically with IV ceftriaxone and doxycycline (allergy to azithromycin-nausea)  CAD in native artery Denies chest pain at this time.  Troponin 31 > 34.  EKG unchanged.   - Recent Cardiology notes for [facility]- 01/02/2024- patient with evidence of HFpEF, likely contributing, and RV dysfunction with evidence of potentially severe pulmonary hypertension by echo.  Also possible progression of his coronary artery disease which was documented 2016, he was scheduled for cardiac cath tomorrow- 3/31. - EDP talked with cardiology on-call, admit here, cardiac cath will be re-scheduled.  Essential hypertension Stable. -Resume hydralazine.  Type 2 diabetes mellitus with other specified complication (HCC) A1c 8.3. - SSi- M -Resume Lantus at reduced dose  2o units daily(45 units home dose) -Hold glipizide, metformin while inpatient  Paroxysmal atrial fibrillation (HCC) Currently in sinus rhythm.  Follows with cardiology. -Resume amiodarone -Status post watchman's device.  He is not on anticoagulation   DVT prophylaxis: Lovenox Code Status: Full code Family Communication: None at bedside. Disposition Plan: ~ 2 days Consults called: none Admission status: Inpt tele I certify  that at the point of admission it is my clinical judgment that the patient will require inpatient hospital care spanning beyond 2 midnights from the point of admission due to high intensity of service, high risk for further deterioration and high frequency of surveillance required.   Author: Onnie Boer, MD 01/13/2024 8:51 PM  For on call review [email or web address].

## 2024-01-13 NOTE — Assessment & Plan Note (Signed)
 Stable. -Resume hydralazine

## 2024-01-13 NOTE — Telephone Encounter (Addendum)
 Cardiac Catheterization scheduled at [facility] for: Tuesday January 14, 2023 12 Noon-this is a time change Arrival time [facility] Main Entrance A at: 7 AM-pre-procedure hydration  Nothing to eat after midnight prior to procedure, clear liquids until 5 AM day of procedure.  Medication instructions: -Hold:  Metformin-day of procedure and 48 hours post procedure  Glimepiride-AM of procedure   Insulin-1/2 usual dose HS prior to procedure  Torsemide-KCl-day before and day of procedure-per protocol GFR < 60 (46) -Other usual morning medications can be taken with sips of water including aspirin 81 mg.  Plan to go home the same day, you will only stay overnight if medically necessary.  You must have responsible adult to drive you home.  Someone must be with you the first 24 hours after you arrive home.  Reviewed procedure instructions with patient, pre-procedure hydration discussed with patient.

## 2024-01-13 NOTE — Assessment & Plan Note (Addendum)
 Currently in sinus rhythm.  Follows with cardiology. -Resume amiodarone -Status post watchman's device.  He is not on anticoagulation

## 2024-01-13 NOTE — Assessment & Plan Note (Signed)
 A1c 8.3. - SSi- M -Resume Lantus at reduced dose 25 units daily(45 units home dose) -Hold glipizide, metformin while inpatient

## 2024-01-13 NOTE — Telephone Encounter (Signed)
 Pt is at [facility] being evaluated for pneumonia and has pending cath in the morning, requesting cb to dsicuss

## 2024-01-13 NOTE — ED Triage Notes (Signed)
 Pt sent down from cancer center for low oxygen reading. Per cancer center pt's oxygen saturation 79% RA, per cancer center patient was informed to deep breath and oxygen reading went as high as lows 90s. During triage pt placed on 2L of oxygen, oxygen saturation 98%.

## 2024-01-13 NOTE — Patient Instructions (Addendum)
 [facility] at [facility] **VISIT SUMMARY & IMPORTANT INSTRUCTIONS **   You were seen today by Rojelio Brenner PA-C for your anemia.    IRON DEFICIENCY ANEMIA This is most likely caused by blood loss in your stomach and intestines.  Continue to follow-up with gastroenterology and seek immediate medical attention if you notice any signs of major blood loss (bright red blood in the toilet or black/tarry bowel movements). Continue to take iron tablet once daily with vitamin C.   We will schedule you for 2 doses of IV iron.  B12 DEFICIENCY You can DECREASE your vitamin B12 supplement to take 1000 mcg every other day.  (This is available over-the-counter.)  LABS: Monthly blood check at lab  FOLLOW-UP APPOINTMENT: 2 months    !! LOW OXYGEN !! Your oxygen level was low today (85% to 90%). Your lungs sounded abnormally quiet on the left side.  This could be a sign of pneumonia, fluid around your lung, or other dangerous heart or lung abnormality. I recommend further evaluation in the EMERGENCY DEPARTMENT. You have declined to visit emergency department at this time. Pay close attention to your symptoms, and seek immediate medical attention if you have any worsening shortness of breath, chest pain, lightheadedness, confusion, loss of consciousness, or other concerning symptoms.      ** Thank you for trusting me with your healthcare!  I strive to provide all of my patients with quality care at each visit.  If you receive a survey for this visit, I would be so grateful to you for taking the time to provide feedback.  Thank you in advance!  ~ Gunnison Chahal                   Dr. Doreatha Massed   &   Rojelio Brenner, PA-C   - - - - - - - - - - - - - - - - - -    Thank you for choosing [facility] at [facility] to provide your oncology and hematology care.  To afford each patient quality time with our provider, please arrive at least 15 minutes  before your scheduled appointment time.   If you have a lab appointment with the Cancer Center please come in thru the Main Entrance and check in at the main information desk.  You need to re-schedule your appointment should you arrive 10 or more minutes late.  We strive to give you quality time with our providers, and arriving late affects you and other patients whose appointments are after yours.  Also, if you no show three or more times for appointments you may be dismissed from the clinic at the providers discretion.     Again, thank you for choosing [facility].  Our hope is that these requests will decrease the amount of time that you wait before being seen by our physicians.       _____________________________________________________________  Should you have questions after your visit to [facility], please contact our office at [phone] and follow the prompts.  Our office hours are 8:00 a.m. and 4:30 p.m. Monday - Friday.  Please note that voicemails left after 4:00 p.m. may not be returned until the following business day.  We are closed weekends and major holidays.  You do have access to a nurse 24-7, just call the main number to the clinic [phone] and do not press any options, hold on the line and  a nurse will answer the phone.    For prescription refill requests, have your pharmacy contact our office and allow 72 hours.     Should you have questions after your visit to [facility], please contact our office at [phone] and follow the prompts.  Our office hours are 8:00 a.m. and 4:30 p.m. Monday - Friday.  Please note that voicemails left after 4:00 p.m. may not be returned until the following business day.  We are closed weekends and major holidays.  You do have access to a nurse 24-7, just call the main number to the clinic [phone] and do not press any options, hold on the line and a nurse will answer the phone.    For  prescription refill requests, have your pharmacy contact our office and allow 72 hours.

## 2024-01-13 NOTE — Assessment & Plan Note (Addendum)
 Denies chest pain at this time.  Troponin 31 > 34.  EKG unchanged.   - Recent Cardiology notes for DOE- 01/02/2024- patient with evidence of HFpEF, likely contributing, and RV dysfunction with evidence of potentially severe pulmonary hypertension by echo.  Also possible progression of his coronary artery disease which was documented 2016, he was scheduled for cardiac cath tomorrow- 3/31. - EDP talked with cardiology on-call, admit here, cardiac cath will be re-scheduled.

## 2024-01-13 NOTE — Assessment & Plan Note (Addendum)
 Dyspnea on exertion, hypoxia, chronic bilateral lower extremity swelling.  Recent discharge with 244.2 Lbs, today he is weighing 251.5 lbs. Chest x-ray shows left lung base atelectasis or infiltrate progressed since prior radiograph, small left pleural effusion not excluded.  Reports compliance with torsemide 20 - 40 mg twice daily.  Recent echo 12/15/2023- EF 55 to 60%.  -IV Lasix 40 twice daily

## 2024-01-14 ENCOUNTER — Ambulatory Visit (HOSPITAL_COMMUNITY): Admission: RE | Admit: 2024-01-14 | Source: Home / Self Care | Admitting: Cardiovascular Disease

## 2024-01-14 ENCOUNTER — Encounter (HOSPITAL_COMMUNITY): Admission: RE | Payer: Self-pay | Source: Home / Self Care

## 2024-01-14 DIAGNOSIS — I25119 Atherosclerotic heart disease of native coronary artery with unspecified angina pectoris: Secondary | ICD-10-CM

## 2024-01-14 DIAGNOSIS — I480 Paroxysmal atrial fibrillation: Secondary | ICD-10-CM | POA: Diagnosis not present

## 2024-01-14 DIAGNOSIS — J9601 Acute respiratory failure with hypoxia: Secondary | ICD-10-CM | POA: Diagnosis not present

## 2024-01-14 DIAGNOSIS — I2510 Atherosclerotic heart disease of native coronary artery without angina pectoris: Secondary | ICD-10-CM | POA: Diagnosis not present

## 2024-01-14 DIAGNOSIS — I10 Essential (primary) hypertension: Secondary | ICD-10-CM | POA: Diagnosis not present

## 2024-01-14 LAB — GLUCOSE, CAPILLARY
Glucose-Capillary: 147 mg/dL — ABNORMAL HIGH (ref 70–99)
Glucose-Capillary: 204 mg/dL — ABNORMAL HIGH (ref 70–99)
Glucose-Capillary: 192 mg/dL — ABNORMAL HIGH (ref 70–99)
Glucose-Capillary: 225 mg/dL — ABNORMAL HIGH (ref 70–99)

## 2024-01-14 SURGERY — RIGHT/LEFT HEART CATH AND CORONARY ANGIOGRAPHY
Anesthesia: LOCAL

## 2024-01-14 MED ORDER — DOXYCYCLINE HYCLATE 100 MG PO TABS
100.0000 mg | ORAL_TABLET | Freq: Two times a day (BID) | ORAL | Status: DC
  Administered 2024-01-14 – 2024-01-16 (×4): 100 mg via ORAL
  Filled 2024-01-14 (×5): qty 1

## 2024-01-14 MED ORDER — NAPHAZOLINE-GLYCERIN 0.012-0.25 % OP SOLN
1.0000 [drp] | Freq: Four times a day (QID) | OPHTHALMIC | Status: DC | PRN
  Filled 2024-01-14: qty 15

## 2024-01-14 MED ORDER — ENOXAPARIN SODIUM 40 MG/0.4ML IJ SOSY
40.0000 mg | PREFILLED_SYRINGE | INTRAMUSCULAR | Status: DC
  Filled 2024-01-14: qty 0.4

## 2024-01-14 MED ORDER — FUROSEMIDE 10 MG/ML IJ SOLN
40.0000 mg | Freq: Once | INTRAMUSCULAR | Status: AC
  Administered 2024-01-14: 40 mg via INTRAVENOUS
  Filled 2024-01-14: qty 4

## 2024-01-14 MED ORDER — FUROSEMIDE 10 MG/ML IJ SOLN
40.0000 mg | Freq: Once | INTRAMUSCULAR | Status: DC
  Filled 2024-01-14: qty 4

## 2024-01-14 MED ORDER — DOCUSATE SODIUM 100 MG PO CAPS
100.0000 mg | ORAL_CAPSULE | Freq: Two times a day (BID) | ORAL | Status: DC
  Administered 2024-01-14 – 2024-01-28 (×21): 100 mg via ORAL
  Filled 2024-01-14 (×27): qty 1

## 2024-01-14 MED ORDER — ENOXAPARIN SODIUM 60 MG/0.6ML IJ SOSY
55.0000 mg | PREFILLED_SYRINGE | INTRAMUSCULAR | Status: DC
  Administered 2024-01-14 – 2024-01-18 (×5): 55 mg via SUBCUTANEOUS
  Filled 2024-01-14 (×5): qty 0.6

## 2024-01-14 MED ORDER — FUROSEMIDE 10 MG/ML IJ SOLN
40.0000 mg | Freq: Two times a day (BID) | INTRAMUSCULAR | Status: DC
  Administered 2024-01-14 – 2024-01-15 (×2): 40 mg via INTRAVENOUS
  Filled 2024-01-14 (×2): qty 4

## 2024-01-14 MED ORDER — POLYETHYLENE GLYCOL 3350 17 G PO PACK
17.0000 g | PACK | Freq: Every day | ORAL | Status: DC
  Administered 2024-01-14 – 2024-01-26 (×8): 17 g via ORAL
  Filled 2024-01-14 (×15): qty 1

## 2024-01-14 MED ORDER — INSULIN GLARGINE-YFGN 100 UNIT/ML ~~LOC~~ SOLN
20.0000 [IU] | Freq: Every day | SUBCUTANEOUS | Status: DC
  Administered 2024-01-14 – 2024-01-16 (×3): 20 [IU] via SUBCUTANEOUS
  Filled 2024-01-14 (×4): qty 0.2

## 2024-01-14 MED ORDER — TRAZODONE HCL 50 MG PO TABS
25.0000 mg | ORAL_TABLET | Freq: Every evening | ORAL | Status: DC | PRN
  Administered 2024-01-14 – 2024-01-24 (×10): 25 mg via ORAL
  Filled 2024-01-14 (×10): qty 1

## 2024-01-14 NOTE — Telephone Encounter (Signed)
 Admitted for hypoxia,cardiac cath cancelled.

## 2024-01-14 NOTE — Progress Notes (Signed)
 Progress Note   Patient: Lucas Dudley PPI:[id number] DOB: [birth year] DOA: 01/13/2024     1 DOS: the patient was seen and examined on 01/14/2024   Brief hospital admission narrative course: As per H&P written by Dr. Mariea Clonts on 01/13/2024 Lucas Dudley is a 83 y.o. male with medical history significant for systolic and diastolic CHF, prostate cancer, hypertension, paroxysmal atrial fibrillation, OSA, diabetes mellitus. Patient was sent to the ED from heme-onc office with reports of hypoxia-sats down to 84 to 90% on room air.  She reports over the past few days he has had worsening dyspnea with exertion, which improves with rest.  Reports chronic unchanged cough.  Chronic bilateral lower extremity edema, and he denies abdominal bloating.[name]  He is unaware of any weight gain. He reports compliance with his medications include torsemide 40 mg daily.    Recent hospitalized-3/1 to 3/4 for decompensated CHF, hypoxia.  Weight on discharge was 244.2 pounds.   He was recently seen in cardiology clinic 01/02/2024 with complaints of dyspnea on exertion and fatigue.  Per notes- patient with evidence of HFpEF, likely contributing, and RV dysfunction with evidence of potentially severe pulmonary hypertension by echo.  Also possible progression of his coronary artery disease which was documented 2016, he was scheduled for cardiac cath tomorrow.   ED Course: Temperature 98.  Heart rate 56.  Respiratory 18.  Blood pressure 154/76.  O2 sats 96% on 2 L.  Troponin 31 >> 34.  Creatinine 1.59.  Chest x-ray with left lung base atelectasis or infiltrate. IV Lasix 40 mg x 1 given. EDP talked to cardiology on-call, admitted here for diuresis, no need to transfer patient's, cardiac cath will be rescheduled.  Assessment and Plan: * Acute hypoxic respiratory failure (HCC) -O2 sats ranging from 84 to 90% on room air.   -Patient reporting short winded sensation with activity and is requiring 2 L supplementation's to maintain  saturation above 90%.  -X-rays at time of admission demonstrating concern for bronchitic changes/atelectasis -Initially IV antibiotics has been started for presumed pneumonia -Will transition to oral doxycycline to minimize IV fluids -Continue treatment with diuresis -Follow daily weights, strict I's and O's and low-sodium diet -Wean off oxygen supplementation as tolerated -Patient instructed to use flutter valve.  Acute on chronic combined systolic and diastolic CHF (congestive heart failure) (HCC) - dry Weight 109 kg/240.3 lbs. dry weight BNP of 111 -Dyspnea on exertion, hypoxia, chronic bilateral lower extremity swelling.  Recent discharge with 244.2 Lbs, today he is weighing 251.5 lbs.  -As mentioned above continue IV diuresis -Reds clip measurement has been requested -Low-sodium diet discussed with patient.  CAD in native artery Denies chest pain at this time.  Troponin 31 > 34.  EKG unchanged.   - Recent Cardiology notes for DOE- 01/02/2024- patient with evidence of HFpEF, likely contributing, and RV dysfunction with evidence of potentially severe pulmonary hypertension by echo.  Also possible progression of his coronary artery disease which was documented 2016, he was scheduled for cardiac cath tomorrow- 3/31. - EDP talked with cardiology on-call, admit here, cardiac cath will be re-scheduled. -Patient denies chest pain -Continue telemetry monitoring -Continue treatment with aspirin.  Essential hypertension -Holding hydralazine in order to provide room for diuresis -Blood pressure currently controlled with Lasix twice a day -Heart healthy/low-sodium diet discussed with patient -Continue to follow vital signs fluctuation.  Type 2 diabetes mellitus with other specified complication (HCC) A1c 8.3. -Continue sliding scale insulin and Semglee -Holding oral hypoglycemic agent -Follow CBG fluctuation.  Paroxysmal atrial fibrillation (HCC) -Currently in sinus rhythm.  Follows with  cardiology. -Status post watchman's device.  He is not on anticoagulation -Continue telemetry monitoring -Continue to follow ultralights and replete as needed; goal is for potassium above 4 and magnesium above 2  Class II obesity -Body mass index is 35.43 kg/m. -Low-calorie diet, portion control and increase physical activity discussed with patient.   Subjective:  Requiring oxygen supplementation to maintain saturation above 90% and feeling short winded on activity.  Signs of fluid overload appreciated on exam.  Physical Exam: Vitals:   01/14/24 0501 01/14/24 0902 01/14/24 1332 01/14/24 1441  BP: (!) 145/65 138/60 137/64   Pulse: (!) 57 (!) 59 (!) 58   Resp: (!) 21   19  Temp: 98.3 F (36.8 C) 98.8 F (37.1 C) 99 F (37.2 C)   TempSrc: Oral Oral Oral   SpO2: 99% 100% 97%   Weight:      Height:       General exam: Alert, awake, oriented x 3; no chest pain, no nausea, no vomiting.  Reporting some orthopnea, lower extremity swelling and feeling short winded with activity. Respiratory system: 2-3 L supplementation in place to maintain saturation; decreased breath sounds at the bases at and positive fine crackles. Cardiovascular system:RRR. No rubs or gallops. Gastrointestinal system: Abdomen is obese, nondistended, soft and nontender. No organomegaly or masses felt. Normal bowel sounds heard. Central nervous system:  No focal neurological deficits. Extremities: No cyanosis or clubbing; 1-2+ edema appreciated bilaterally. Skin: No petechiae. Psychiatry: Judgement and insight appear normal. Mood & affect appropriate.   Family Communication: No family at bedside.  Disposition: Status is: Inpatient Remains inpatient appropriate because: IV diuresis.   Planned Discharge Destination: Home  Time spent: 50 minutes  Author: Vassie Loll, MD 01/14/2024 5:23 PM  For on call review [email or web address].

## 2024-01-14 NOTE — TOC Initial Note (Signed)
 Transition of Care [facility]) - Initial/Assessment Note    Patient Details  Name: Lucas Dudley MRN: [id number] Date of Birth: 1940/12/30  [facility]) CM/SW Contact:    Villa Herb, LCSWA Phone Number: 01/14/2024, 10:06 AM  Clinical Narrative:                 Pt is high risk for readmission. CSW spoke with pt to complete assessment. Pt lives alone and is independent in completing his ADLs. Pt states that he drives to appointments when needed. Pt has a cane to use if needed. Pt has not had HH in the past. TOC to follow.   Expected Discharge Plan: Home/Self Care Barriers to Discharge: Continued Medical Work up   Patient Goals and CMS Choice Patient states their goals for this hospitalization and ongoing recovery are:: get stronger CMS Medicare.gov Compare Post Acute Care list provided to:: Patient Choice offered to / list presented to : Patient      Expected Discharge Plan and Services In-house Referral: Clinical Social Work Discharge Planning Services: CM Consult   Living arrangements for the past 2 months: Single Family Home                                      Prior Living Arrangements/Services Living arrangements for the past 2 months: Single Family Home Lives with:: Self Patient language and need for interpreter reviewed:: Yes Do you feel safe going back to the place where you live?: Yes      Need for Family Participation in Patient Care: Yes (Comment) Care giver support system in place?: Yes (comment) Current home services: DME [facility]) Criminal Activity/Legal Involvement Pertinent to Current Situation/Hospitalization: No - Comment as needed  Activities of Daily Living   ADL Screening (condition at time of admission) Independently performs ADLs?: Yes (appropriate for developmental age) Is the patient deaf or have difficulty hearing?: No Does the patient have difficulty seeing, even when wearing glasses/contacts?: No Does the patient have  difficulty concentrating, remembering, or making decisions?: No  Permission Sought/Granted                  Emotional Assessment Appearance:: Appears stated age Attitude/Demeanor/Rapport: Engaged Affect (typically observed): Accepting Orientation: : Oriented to Self, Oriented to Place, Oriented to  Time, Oriented to Situation Alcohol / Substance Use: Not Applicable Psych Involvement: No (comment)  Admission diagnosis:  Acute diastolic CHF (congestive heart failure) (HCC) [I50.31] Acute hypoxic respiratory failure (HCC) [J96.01] Patient Active Problem List   Diagnosis Date Noted   Chronic combined systolic and diastolic CHF (congestive heart failure) (HCC) - dry Weight 109 kg/240.3 lbs. 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, associated with type 2 diabetes mellitus (HCC) 07/18/2023   Allergic rhinitis 07/18/2023   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 06-28-2022 06/28/2022   Anemia due to chronic blood loss 06/08/2022   AVM (arteriovenous malformation) of small bowel, acquired    Obesity, Class  II, BMI 35-39.9 05/22/2022   Constipation    Secondary hypercoagulable state (HCC) 09/18/2021   History of colonic polyps 01/22/2017   Family hx of colorectal cancer 01/22/2017   Acute on chronic combined systolic and diastolic CHF (congestive heart failure) (HCC) - dry Weight 109 kg/240.3 lbs. dry weight BNP of 111 05/17/2016   CAD in native artery 05/17/2016   Cardiomyopathy,  dilated, nonischemic (HCC) - dry Weight 109 kg/240.3 lbs. dry weight BNP of 111 05/04/2015   OSA on CPAP 04/25/2015   Chronic anticoagulation    Shortness of breath 01/18/2015   Paroxysmal atrial fibrillation (HCC) 07/06/2014   Calculus of kidney 06/14/2014   Type 2 diabetes mellitus with other specified complication (HCC) 06/14/2014   CA of prostate (HCC) 06/14/2014   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/14/2014   Essential hypertension 07/10/2013   Hyperlipidemia 07/10/2013   PCP:  Anabel Halon, MD Pharmacy:   [phone] [place], [place] - [address] AT [facility] [address] [place] [address] Phone: [phone] Fax: [phone]  [facility] [place], [place] - [address] [facility] AT Portal to [facility] [address] [place] [address] Phone: [phone] Fax: [phone]     Social Drivers of Health (SDOH) Social History: SDOH Screenings   Food Insecurity: Patient Declined (01/14/2024)  Housing: Patient Declined (01/14/2024)  Transportation Needs: Patient Declined (01/14/2024)  Utilities: Patient Declined (01/14/2024)  Alcohol Screen: Low Risk  (11/20/2023)  Depression (PHQ2-9): Low Risk  (12/31/2023)  Financial Resource Strain: Low Risk  (12/17/2023)  Physical Activity: Inactive (11/20/2023)  Social Connections: Patient Declined (01/14/2024)  Recent Concern: Social Connections - Moderately Isolated (11/20/2023)  Stress: No Stress Concern Present (11/20/2023)  Tobacco Use: Medium Risk (01/13/2024)  Health Literacy: Adequate Health Literacy (11/20/2023)   SDOH Interventions:     Readmission Risk Interventions    01/14/2024   10:05 AM 12/17/2023   10:19 AM 12/16/2023   12:34 PM  Readmission Risk Prevention Plan  Transportation Screening Complete Complete Complete  Home Care Screening  Complete Complete  Medication Review (RN CM)  Complete Complete  HRI or Home Care Consult Complete     Social Work Consult for Recovery Care Planning/Counseling Complete    Palliative Care Screening Not Applicable    Medication Review Oceanographer) Complete

## 2024-01-15 DIAGNOSIS — J9601 Acute respiratory failure with hypoxia: Secondary | ICD-10-CM | POA: Diagnosis not present

## 2024-01-15 LAB — MAGNESIUM: Magnesium: 1.7 mg/dL (ref 1.7–2.4)

## 2024-01-15 LAB — CBC
HCT: 29.6 % — ABNORMAL LOW (ref 39.0–52.0)
Hemoglobin: 8.9 g/dL — ABNORMAL LOW (ref 13.0–17.0)
MCH: 29.9 pg (ref 26.0–34.0)
MCHC: 30.1 g/dL (ref 30.0–36.0)
MCV: 99.3 fL (ref 80.0–100.0)
Platelets: 254 10*3/uL (ref 150–400)
RBC: 2.98 MIL/uL — ABNORMAL LOW (ref 4.22–5.81)
RDW: 19.2 % — ABNORMAL HIGH (ref 11.5–15.5)
WBC: 7.9 10*3/uL (ref 4.0–10.5)
nRBC: 0 % (ref 0.0–0.2)

## 2024-01-15 LAB — BASIC METABOLIC PANEL WITH GFR
Anion gap: 12 (ref 5–15)
BUN: 24 mg/dL — ABNORMAL HIGH (ref 8–23)
CO2: 32 mmol/L (ref 22–32)
Calcium: 9 mg/dL (ref 8.9–10.3)
Chloride: 92 mmol/L — ABNORMAL LOW (ref 98–111)
Creatinine, Ser: 1.19 mg/dL (ref 0.61–1.24)
GFR, Estimated: >60 mL/min (ref >60)
Glucose, Bld: 160 mg/dL — ABNORMAL HIGH (ref 70–99)
Potassium: 3.3 mmol/L — ABNORMAL LOW (ref 3.5–5.1)
Sodium: 136 mmol/L (ref 135–145)

## 2024-01-15 LAB — GLUCOSE, CAPILLARY
Glucose-Capillary: 156 mg/dL — ABNORMAL HIGH (ref 70–99)
Glucose-Capillary: 202 mg/dL — ABNORMAL HIGH (ref 70–99)
Glucose-Capillary: 199 mg/dL — ABNORMAL HIGH (ref 70–99)
Glucose-Capillary: 252 mg/dL — ABNORMAL HIGH (ref 70–99)

## 2024-01-15 LAB — PROCALCITONIN: Procalcitonin: <0.1 ng/mL

## 2024-01-15 MED ORDER — FUROSEMIDE 10 MG/ML IJ SOLN
40.0000 mg | Freq: Two times a day (BID) | INTRAMUSCULAR | Status: DC
  Administered 2024-01-15: 40 mg via INTRAVENOUS
  Filled 2024-01-15: qty 4

## 2024-01-15 MED ORDER — SODIUM CHLORIDE 0.9 % IV SOLN
100.0000 mg | Freq: Once | INTRAVENOUS | Status: AC
  Administered 2024-01-15: 100 mg via INTRAVENOUS
  Filled 2024-01-15: qty 5

## 2024-01-15 MED ORDER — ACETAMINOPHEN 325 MG PO TABS
650.0000 mg | ORAL_TABLET | Freq: Four times a day (QID) | ORAL | Status: DC | PRN
  Administered 2024-01-15 – 2024-01-27 (×11): 650 mg via ORAL
  Filled 2024-01-15 (×11): qty 2

## 2024-01-15 MED ORDER — DIPHENHYDRAMINE HCL 25 MG PO CAPS
25.0000 mg | ORAL_CAPSULE | Freq: Four times a day (QID) | ORAL | Status: DC | PRN
Start: 2024-01-15 — End: 2024-01-28
  Administered 2024-01-15 – 2024-01-16 (×2): 25 mg via ORAL
  Filled 2024-01-15 (×2): qty 1

## 2024-01-15 MED ORDER — SMOG ENEMA
960.0000 mL | Freq: Once | RECTAL | Status: AC
  Administered 2024-01-15: 960 mL via RECTAL
  Filled 2024-01-15: qty 960

## 2024-01-15 MED ORDER — POTASSIUM CHLORIDE CRYS ER 20 MEQ PO TBCR
40.0000 meq | EXTENDED_RELEASE_TABLET | Freq: Four times a day (QID) | ORAL | Status: AC
  Administered 2024-01-15 (×2): 40 meq via ORAL
  Filled 2024-01-15 (×2): qty 2

## 2024-01-15 MED ORDER — CYANOCOBALAMIN 1000 MCG/ML IJ SOLN
1000.0000 ug | Freq: Once | INTRAMUSCULAR | Status: AC
  Administered 2024-01-15: 1000 ug via INTRAMUSCULAR
  Filled 2024-01-15: qty 1

## 2024-01-15 NOTE — Progress Notes (Signed)
 PROGRESS NOTE    Lucas Dudley  ZOX:[id number] DOB: 01-10-1941 DOA: 01/13/2024 PCP: Anabel Halon, MD   Brief Narrative:    MATTHEWJAMES Dudley is a 83 y.o. male with medical history significant for systolic and diastolic CHF, prostate cancer, hypertension, paroxysmal atrial fibrillation, OSA, diabetes mellitus. Patient was sent to the ED from heme-onc office with reports of hypoxia-sats down to 84 to 90% on room air.  She reports over the past few days he has had worsening dyspnea with exertion, which improves with rest.  Reports chronic unchanged cough.  Chronic bilateral lower extremity edema, and he denies abdominal bloating.[name]  He is unaware of any weight gain. He reports compliance with his medications include torsemide 40 mg daily.    Recent hospitalized-3/1 to 3/4 for decompensated CHF, hypoxia.  Weight on discharge was 244.2 pounds.   He was recently seen in cardiology clinic 01/02/2024 with complaints of dyspnea on exertion and fatigue.  Per notes- patient with evidence of HFpEF, likely contributing, and RV dysfunction with evidence of potentially severe pulmonary hypertension by echo.  Also possible progression of his coronary artery disease which was documented 2016, he was scheduled for cardiac cath tomorrow.  Assessment & Plan:   Principal Problem:   Acute hypoxic respiratory failure (HCC) Active Problems:   Acute on chronic combined systolic and diastolic CHF (congestive heart failure) (HCC) - dry Weight 109 kg/240.3 lbs. dry weight BNP of 111   Essential hypertension   CAD in native artery   Paroxysmal atrial fibrillation (HCC)   Type 2 diabetes mellitus with other specified complication (HCC)  Assessment and Plan:  Acute hypoxic respiratory failure (HCC) -O2 sats ranging from 84 to 90% on room air.   -Patient reporting short winded sensation with activity and is requiring 2 L supplementation's to maintain saturation above 90%.  -X-rays at time of admission demonstrating  concern for bronchitic changes/atelectasis -Initially IV antibiotics has been started for presumed pneumonia -Will transition to oral doxycycline to minimize IV fluids -Continue treatment with diuresis -Follow daily weights, strict I's and O's and low-sodium diet -Wean off oxygen supplementation as tolerated -Patient instructed to use flutter valve. -Check procalcitonin and wean antibiotics as tolerated   Acute on chronic combined systolic and diastolic CHF (congestive heart failure) (HCC) - dry Weight 109 kg/240.3 lbs. dry weight BNP of 111 -Dyspnea on exertion, hypoxia, chronic bilateral lower extremity swelling.  Recent discharge with 244.2 Lbs, today he is weighing 251.5 lbs.  -As mentioned above continue IV diuresis -Reds clip measurement has been requested -Low-sodium diet discussed with patient.   CAD in native artery Denies chest pain at this time.  Troponin 31 > 34.  EKG unchanged.   - Recent Cardiology notes for DOE- 01/02/2024- patient with evidence of HFpEF, likely contributing, and RV dysfunction with evidence of potentially severe pulmonary hypertension by echo.  Also possible progression of his coronary artery disease which was documented 2016, he was scheduled for cardiac cath tomorrow- 3/31. - EDP talked with cardiology on-call, admit here, cardiac cath will be re-scheduled. -Patient denies chest pain -Continue telemetry monitoring -Continue treatment with aspirin.   Essential hypertension -Holding hydralazine in order to provide room for diuresis -Blood pressure currently controlled with Lasix twice a day -Heart healthy/low-sodium diet discussed with patient -Continue to follow vital signs fluctuation.  Iron deficiency anemia -Supplement IV iron and B12 -Monitor hemoglobin trend -Thought to have occult bleeding and follows hematology   Type 2 diabetes mellitus with other specified complication (HCC) A1c  8.3. -Continue sliding scale insulin and Semglee -Holding  oral hypoglycemic agent -Follow CBG fluctuation.   Paroxysmal atrial fibrillation (HCC) -Currently in sinus rhythm.  Follows with cardiology. -Status post watchman's device.  He is not on anticoagulation -Continue telemetry monitoring -Continue to follow ultralights and replete as needed; goal is for potassium above 4 and magnesium above 2   Class II obesity -Body mass index is 35.43 kg/m. -Low-calorie diet, portion control and increase physical activity discussed with patient.    DVT prophylaxis: Lovenox Code Status: Full Family Communication: Daughter on phone 4/2 Disposition Plan:  Status is: Inpatient Remains inpatient appropriate because: Need for IV medications.   Consultants:  Cardiology  Procedures:  None  Antimicrobials:  Anti-infectives (From admission, onward)    Start     Dose/Rate Route Frequency Ordered Stop   01/14/24 2200  doxycycline (VIBRA-TABS) tablet 100 mg        100 mg Oral Every 12 hours 01/14/24 1702     01/13/24 2200  cefTRIAXone (ROCEPHIN) 2 g in sodium chloride 0.9 % 100 mL IVPB  Status:  Discontinued        2 g 200 mL/hr over 30 Minutes Intravenous Every 24 hours 01/13/24 2006 01/14/24 1702   01/13/24 2200  doxycycline (VIBRAMYCIN) 100 mg in sodium chloride 0.9 % 250 mL IVPB  Status:  Discontinued        100 mg 125 mL/hr over 120 Minutes Intravenous Every 12 hours 01/13/24 2006 01/14/24 1702       Subjective: Patient seen and evaluated today with no new acute complaints or concerns. No acute concerns or events noted overnight.  Continues to diurese well.  Objective: Vitals:   01/14/24 1441 01/14/24 2132 01/14/24 2139 01/15/24 0500  BP:  (!) 145/59  136/71  Pulse:  (!) 58 60   Resp: 19 20 18 18   Temp:  98.5 F (36.9 C)  99.1 F (37.3 C)  TempSrc:  Oral  Oral  SpO2:  100% 94% 95%  Weight:    110.4 kg  Height:        Intake/Output Summary (Last 24 hours) at 01/15/2024 1309 Last data filed at 01/15/2024 1144 Gross per 24 hour   Intake 730 ml  Output 2750 ml  Net -2020 ml   Filed Weights   01/13/24 1535 01/13/24 1952 01/15/24 0500  Weight: 114.1 kg 112 kg 110.4 kg    Examination:  General exam: Appears calm and comfortable  Respiratory system: Clear to auscultation. Respiratory effort normal. Cardiovascular system: S1 & S2 heard, RRR.  Gastrointestinal system: Abdomen is soft Central nervous system: Alert and awake Extremities: No edema Skin: No significant lesions noted Psychiatry: Flat affect.    Data Reviewed: I have personally reviewed following labs and imaging studies  CBC: Recent Labs  Lab 01/10/24 1141 01/13/24 1609 01/15/24 0435  WBC 6.3 8.7 7.9  NEUTROABS  --  7.1  --   HGB 9.5* 8.8* 8.9*  HCT 30.3* 28.6* 29.6*  MCV 96.5 97.6 99.3  PLT 251 235 254   Basic Metabolic Panel: Recent Labs  Lab 01/10/24 1141 01/13/24 1609 01/15/24 0435  NA 136 135 136  K 3.9 3.7 3.3*  CL 94* 94* 92*  CO2 30 28 32  GLUCOSE 248* 235* 160*  BUN 27* 32* 24*  CREATININE 1.51* 1.59* 1.19  CALCIUM 9.1 8.7* 9.0  MG  --   --  1.7   GFR: Estimated Creatinine Clearance: 60.6 mL/min (by C-G formula based on SCr of 1.19 mg/dL). Liver  Function Tests: Recent Labs  Lab 01/13/24 1609  AST 28  ALT 20  ALKPHOS 52  BILITOT 0.7  PROT 7.5  ALBUMIN 3.6   No results for input(s): "LIPASE", "AMYLASE" in the last 168 hours. No results for input(s): "AMMONIA" in the last 168 hours. Coagulation Profile: No results for input(s): "INR", "PROTIME" in the last 168 hours. Cardiac Enzymes: No results for input(s): "CKTOTAL", "CKMB", "CKMBINDEX", "TROPONINI" in the last 168 hours. BNP (last 3 results) No results for input(s): "PROBNP" in the last 8760 hours. HbA1C: No results for input(s): "HGBA1C" in the last 72 hours. CBG: Recent Labs  Lab 01/14/24 1132 01/14/24 1641 01/14/24 2134 01/15/24 0818 01/15/24 1135  GLUCAP 204* 192* 225* 156* 202*   Lipid Profile: No results for input(s): "CHOL", "HDL",  "LDLCALC", "TRIG", "CHOLHDL", "LDLDIRECT" in the last 72 hours. Thyroid Function Tests: No results for input(s): "TSH", "T4TOTAL", "FREET4", "T3FREE", "THYROIDAB" in the last 72 hours. Anemia Panel: No results for input(s): "VITAMINB12", "FOLATE", "FERRITIN", "TIBC", "IRON", "RETICCTPCT" in the last 72 hours. Sepsis Labs: No results for input(s): "PROCALCITON", "LATICACIDVEN" in the last 168 hours.  No results found for this or any previous visit (from the past 240 hours).       Radiology Studies: DG Chest Port 1 View Result Date: 01/13/2024 CLINICAL DATA:  Shortness of breath. EXAM: PORTABLE CHEST 1 VIEW COMPARISON:  Chest radiograph dated 01/02/2024. FINDINGS: Shallow inspiration. Left lung base density, progressed since the prior radiograph and may represent atelectasis or infiltrate. A small left pleural effusion is not excluded no pneumothorax. Stable cardiac silhouette. No acute osseous pathology. IMPRESSION: Left lung base atelectasis or infiltrate. Electronically Signed   By: Elgie Collard M.D.   On: 01/13/2024 16:33        Scheduled Meds:  amiodarone  200 mg Oral QHS   aspirin  81 mg Oral Daily   cyanocobalamin  1,000 mcg Intramuscular Once   docusate sodium  100 mg Oral BID   doxycycline  100 mg Oral Q12H   enoxaparin (LOVENOX) injection  55 mg Subcutaneous Q24H   furosemide  40 mg Intravenous BID   insulin aspart  0-15 Units Subcutaneous TID WC   insulin aspart  0-5 Units Subcutaneous QHS   insulin glargine-yfgn  20 Units Subcutaneous QHS   pantoprazole  40 mg Oral Daily   polyethylene glycol  17 g Oral Daily   potassium chloride  40 mEq Oral Q6H   Continuous Infusions:  iron sucrose       LOS: 2 days    Time spent: 55 minutes    Samai Corea Hoover Brunette, DO [facility]  If 7PM-7AM, please contact night-coverage [email or web address] 01/15/2024, 1:09 PM

## 2024-01-15 NOTE — Consult Note (Signed)
 Cardiology Consultation   Patient ID: Lucas Dudley MRN: [id number]; DOB: [birth year]  Admit date: 01/13/2024 Date of Consult: 01/15/2024  PCP:  Anabel Halon, MD   [facility] Providers Cardiologist:  Nona Dell, MD  Electrophysiologist:  Hillis Range, MD (Inactive)  {    Patient Profile:   Lucas Dudley is a 83 y.o. male with a hx of  HFpEF, CAD with occluded diagonal and moderate disease of LAD and PDA, persistent afib with watchman, OSA, DM, iron deficient anemia who is being seen 01/15/2024 for the evaluation of SOB at the request of Dr Sherryll Burger.  History of Present Illness:   Lucas Dudley 83 yo male history of chronic HFpEF, CAD with occluded diagonal and moderate disease of LAD and PDA, persistent afib with watchman, OSA, DM, iron deficient anemia,  admitted with SOB and hypoxia. Was sent from heme/onc office due to sats in the mid 80s. Reports progressive DOE over the last few weeks, has chronic LE edema he reports is stable. 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 Cath 03/17/15 showing 100% ostial D1, 50% prox LAD to mid LAD, 40% RPDA stenosis. Med rx. // Myoview 01/2020: EF 31 diffuse perfusion defect without reversibility (suspect artifact); reviewed with Dr. Tama High study felt to be low risk   Cataract    Mixed form OD   Chronic diastolic CHF (congestive heart failure) (HCC)    Chronically elevated hemidiaphragm - Right Side    Diabetic retinopathy (HCC)    NPDR OU   Essential hypertension    Glaucoma    POAG OU   History of gout    Hyperlipidemia     Hypertensive retinopathy    OU   Kidney stones    Melanoma of neck (HCC)    NICM (nonischemic cardiomyopathy) (HCC)    OSA on CPAP 2012   Prostate cancer (HCC)    Type II diabetes mellitus (HCC)     Past Surgical History:  Procedure Laterality Date   ABDOMINAL HERNIA REPAIR     w/mesh   ATRIAL FIBRILLATION ABLATION N/A 06/06/2021   Procedure: ATRIAL FIBRILLATION ABLATION;  Surgeon: Hillis Range, MD;  Location: [facility] INVASIVE CV LAB;  Service: Cardiovascular;  Laterality: N/A;   BIOPSY  05/24/2022   Procedure: BIOPSY;  Surgeon: Corbin Ade, MD;  Location: [facility];  Service: Endoscopy;;   BIOPSY  05/30/2022   Procedure: BIOPSY;  Surgeon: Corbin Ade, MD;  [facility]: [facility];  Service: Endoscopy;;   BIOPSY  01/10/2023   Procedure: BIOPSY;  Surgeon: Lemar Lofty., MD;  Location: [facility];  Service: Gastroenterology;;   CARDIAC CATHETERIZATION N/A 03/17/2015   Procedure: Left Heart Cath and Coronary Angiography;  Surgeon: Runell Gess, MD;  Location: [facility] INVASIVE CV LAB;  Service: Cardiovascular;  Laterality: N/A;   CARDIOVERSION N/A 08/09/2021   Procedure: CARDIOVERSION;  Surgeon: Chilton Si, MD;  Location: [facility] ENDOSCOPY;  Service: Cardiovascular;  Laterality: N/A;   CARDIOVERSION N/A 11/10/2021   Procedure: CARDIOVERSION;  Surgeon: Thomasene Ripple, DO;  Location: [facility] ENDOSCOPY;  Service: Cardiovascular;  Laterality: N/A;   carotid doppler  09/17/2008   rigt and left ICAs 0-49%;mildly  abnormal   CATARACT EXTRACTION Left 2020   Dr. Alben Spittle   COLONOSCOPY N/A 05/16/2017   Procedure: COLONOSCOPY;  Surgeon: Malissa Hippo, MD;  [facility]: [facility];  Service: Endoscopy;  Laterality: N/A;  930   COLONOSCOPY WITH PROPOFOL N/A 05/24/2022   Procedure: COLONOSCOPY WITH PROPOFOL;  Surgeon: Corbin Ade, MD;  [facility]: [facility];  Service: Endoscopy;  Laterality: N/A;   DOPPLER ECHOCARDIOGRAPHY  05/25/2009   EF 50-55%,LA mildly dilated, LV function normal    ELECTROPHYSIOLOGIC STUDY N/A 04/05/2015   Procedure: Cardioversion;  Surgeon: Thurmon Fair, MD;  Location: [facility];  Service: Cardiovascular;  Laterality: N/A;   ELECTROPHYSIOLOGIC STUDY N/A 09/06/2015   Procedure: Atrial Fibrillation Ablation;  Surgeon: Hillis Range, MD;  Location: [facility] INVASIVE CV LAB;  Service: Cardiovascular;  Laterality: N/A;   ELECTROPHYSIOLOGIC STUDY N/A 07/12/2016   redo afib ablation by Dr Johney Frame   ENTEROSCOPY N/A 05/30/2022   Procedure: ENTEROSCOPY;  Surgeon: Corbin Ade, MD;  [facility];  Service: Endoscopy;  Laterality: N/A;   ENTEROSCOPY N/A 01/07/2024   Procedure: ENTEROSCOPY;  Surgeon: Dolores Frame, MD;  [facility];  Service: Gastroenterology;  Laterality: N/A;  1:45PM;ASA 3   ESOPHAGOGASTRODUODENOSCOPY (EGD) WITH PROPOFOL N/A 05/24/2022   Procedure: ESOPHAGOGASTRODUODENOSCOPY (EGD) WITH PROPOFOL;  Surgeon: Corbin Ade, MD;  [facility];  Service: Endoscopy;  Laterality: N/A;   ESOPHAGOGASTRODUODENOSCOPY (EGD) WITH PROPOFOL N/A 01/10/2023   Procedure: ESOPHAGOGASTRODUODENOSCOPY (EGD) WITH PROPOFOL;  Surgeon: Meridee Score Netty Starring., MD;  Location: [facility] ENDOSCOPY;  Service: Gastroenterology;  Laterality: N/A;   EUS N/A 01/10/2023   Procedure: UPPER ENDOSCOPIC ULTRASOUND (EUS) RADIAL;  Surgeon: Lemar Lofty., MD;  Location: [facility] ENDOSCOPY;  Service: Gastroenterology;  Laterality: N/A;   EXCISIONAL HEMORRHOIDECTOMY     "inside and out"   EYE SURGERY Left 2020   Cat Sx - Dr. Alben Spittle   FINE NEEDLE ASPIRATION Right    knee; "drew ~ 1 quart off"   GIVENS CAPSULE STUDY N/A 05/25/2022   Procedure: GIVENS CAPSULE STUDY;  Surgeon: Lanelle Bal, DO;  [facility]: [facility];  Service: Endoscopy;  Laterality: N/A;   GIVENS CAPSULE STUDY N/A 01/23/2023   Procedure: GIVENS CAPSULE STUDY;  Surgeon: Dolores Frame, MD;  [facility];  Service: Gastroenterology;  Laterality: N/A;   8:30am   HERNIA REPAIR     HOT HEMOSTASIS N/A 01/10/2023   Procedure: HOT HEMOSTASIS (ARGON PLASMA COAGULATION/BICAP);  Surgeon: Lemar Lofty., MD;  Location: [facility];  Service: Gastroenterology;  Laterality: N/A;   HOT HEMOSTASIS  01/07/2024   Procedure: EGD, WITH ARGON PLASMA COAGULATION;  Surgeon: Dolores Frame, MD;  [facility];  Service: Gastroenterology;;   LAPAROSCOPIC CHOLECYSTECTOMY     LEFT ATRIAL APPENDAGE OCCLUSION N/A 06/28/2022   Procedure: LEFT ATRIAL APPENDAGE OCCLUSION;  Surgeon: Tonny Bollman, MD;  Location: [facility] INVASIVE CV LAB;  Service: Cardiovascular;  Laterality: N/A;   MELANOMA EXCISION Right    "neck"   NM MYOCAR PERF WALL MOTION  02/21/2012   EF 61% ,EXERCISE 7 METS. exercise stopped due to wheezing and shortness of breathe   POLYPECTOMY  05/16/2017   Procedure: POLYPECTOMY;  Surgeon: Malissa Hippo, MD;  [facility];  Service: Endoscopy;;  colon   POLYPECTOMY  05/24/2022   Procedure: POLYPECTOMY;  Surgeon: Jena Gauss,  Gerrit Friends, MD;  Location: [facility];  Service: Endoscopy;;   POLYPECTOMY  01/10/2023   Procedure: POLYPECTOMY;  Surgeon: Mansouraty, Netty Starring., MD;  Location: [facility];  Service: Gastroenterology;;   PROSTATECTOMY     SHOULDER OPEN ROTATOR CUFF REPAIR Right X 2   SUBMUCOSAL TATTOO INJECTION  05/30/2022   Procedure: SUBMUCOSAL TATTOO INJECTION;  Surgeon: Corbin Ade, MD;  Location: [facility];  Service: Endoscopy;;   SUBMUCOSAL TATTOO INJECTION  01/10/2023   Procedure: SUBMUCOSAL TATTOO INJECTION;  Surgeon: Lemar Lofty., MD;  Location: [facility];  Service: Gastroenterology;;   TEE WITHOUT CARDIOVERSION N/A 09/05/2015   Procedure: TRANSESOPHAGEAL ECHOCARDIOGRAM (TEE);  Surgeon: Quintella Reichert, MD;  Location: [facility] ENDOSCOPY;  Service: Cardiovascular;  Laterality: N/A;   TEE WITHOUT CARDIOVERSION N/A 06/28/2022   Procedure: TRANSESOPHAGEAL ECHOCARDIOGRAM (TEE);  Surgeon: Tonny Bollman,  MD;  Location: [facility] INVASIVE CV LAB;  Service: Cardiovascular;  Laterality: N/A;      Inpatient Medications: Scheduled Meds:  amiodarone  200 mg Oral QHS   aspirin  81 mg Oral Daily   docusate sodium  100 mg Oral BID   doxycycline  100 mg Oral Q12H   enoxaparin (LOVENOX) injection  55 mg Subcutaneous Q24H   furosemide  40 mg Intravenous Q12H   insulin aspart  0-15 Units Subcutaneous TID WC   insulin aspart  0-5 Units Subcutaneous QHS   insulin glargine-yfgn  20 Units Subcutaneous QHS   pantoprazole  40 mg Oral Daily   polyethylene glycol  17 g Oral Daily   Continuous Infusions:  PRN Meds: albuterol, naphazoline-glycerin, traZODone  Allergies:    Allergies  Allergen Reactions   Azithromycin Nausea Only   Tape Rash and Other (See Comments)    Causes skin redness, Use paper tape only.    Social History:   Social History   Socioeconomic History   Marital status: Widowed    Spouse name: Not on file   Number of children: 1   Years of education: Not on file   Highest education level: Not on file  Occupational History   Occupation: Retired  Tobacco Use   Smoking status: Former    Current packs/day: 0.00    Average packs/day: 2.0 packs/day for 27.0 years (54.0 ttl pk-yrs)    Types: Cigarettes    Start date: 7    Quit date: 1992    Years since quitting: 33.2    Passive exposure: Current   Smokeless tobacco: Never   Tobacco comments:    Former smoker 09/27/21  Vaping Use   Vaping status: Never Used  Substance and Sexual Activity   Alcohol use: No    Alcohol/week: 0.0 standard drinks of alcohol    Comment: "used to drink; stopped ~ 2008"   Drug use: No   Sexual activity: Not Currently  Other Topics Concern   Not on file  Social History Narrative   Lives in [place], [place] with wife.   Social Drivers of Corporate investment banker Strain: Low Risk  (12/17/2023)   Overall Financial Resource Strain (CARDIA)    Difficulty of Paying Living Expenses: Not hard at all   Food Insecurity: Patient Declined (01/14/2024)   Hunger Vital Sign    Worried About Running Out of Food in the Last Year: Patient declined    Ran Out of Food in the Last Year: Patient declined  Transportation Needs: Patient Declined (01/14/2024)   PRAPARE - Administrator, Civil Service (Medical): Patient declined  Lack of Transportation (Non-Medical): Patient declined  Physical Activity: Inactive (11/20/2023)   Exercise Vital Sign    Days of Exercise per Week: 0 days    Minutes of Exercise per Session: 0 min  Stress: No Stress Concern Present (11/20/2023)   [employer] of Occupational Health - Occupational Stress Questionnaire    Feeling of Stress : Not at all  Social Connections: Patient Declined (01/14/2024)   Social Connection and Isolation Panel [NHANES]    Frequency of Communication with Friends and Family: Patient declined    Frequency of Social Gatherings with Friends and Family: Patient declined    Attends Religious Services: Patient declined    Database administrator or Organizations: Patient declined    Attends Banker Meetings: Patient declined    Marital Status: Patient declined  Recent Concern: Social Connections - Moderately Isolated (11/20/2023)   Social Connection and Isolation Panel [NHANES]    Frequency of Communication with Friends and Family: More than three times a week    Frequency of Social Gatherings with Friends and Family: Once a week    Attends Religious Services: More than 4 times per year    Active Member of [employer] or Organizations: No    Attends Banker Meetings: Never    Marital Status: Widowed  Intimate Partner Violence: Patient Declined (01/14/2024)   Humiliation, Afraid, Rape, and Kick questionnaire    Fear of Current or Ex-Partner: Patient declined    Emotionally Abused: Patient declined    Physically Abused: Patient declined    Sexually Abused: Patient declined    Family History:    Family History  Problem  Relation Age of Onset   Heart disease Mother    Lung cancer Mother    Heart attack Mother 41   Diabetes Father    Heart disease Father    Stroke Brother    Healthy Daughter    Colon cancer Maternal Aunt        70s     ROS:  Please see the history of present illness.   All other ROS reviewed and negative.     Physical Exam/Data:   Vitals:   01/14/24 1441 01/14/24 2132 01/14/24 2139 01/15/24 0500  BP:  (!) 145/59  136/71  Pulse:  (!) 58 60   Resp: 19 20 18 18   Temp:  98.5 F (36.9 C)  99.1 F (37.3 C)  TempSrc:  Oral  Oral  SpO2:  100% 94% 95%  Weight:    110.4 kg  Height:        Intake/Output Summary (Last 24 hours) at 01/15/2024 0910 Last data filed at 01/15/2024 0500 Gross per 24 hour  Intake 970 ml  Output 2550 ml  Net -1580 ml      01/15/2024    5:00 AM 01/13/2024    7:52 PM 01/13/2024    3:35 PM  Last 3 Weights  Weight (lbs) 243 lb 6.2 oz 246 lb 14.6 oz 251 lb 8.7 oz  Weight (kg) 110.4 kg 112 kg 114.1 kg     Body mass index is 34.92 kg/m.  General:  Well nourished, well developed, in no acute distress HEENT: normal Neck: + JVD Vascular: No carotid bruits; Distal pulses 2+ bilaterally Cardiac:  RRR, no mrg Lungs:  decreased breath sounds bilateral bases Abd: soft, nontender, no hepatomegaly  Ext: 2+ bilateral LE edema Musculoskeletal:  No deformities, BUE and BLE strength normal and equal Skin: warm and dry  Neuro:  CNs 2-12 intact, no focal  abnormalities noted Psych:  Normal affect     Laboratory Data:  High Sensitivity Troponin:   Recent Labs  Lab 01/13/24 1609 01/13/24 1739  TROPONINIHS 31* 34*     Chemistry Recent Labs  Lab 01/10/24 1141 01/13/24 1609 01/15/24 0435  NA 136 135 136  K 3.9 3.7 3.3*  CL 94* 94* 92*  CO2 30 28 32  GLUCOSE 248* 235* 160*  BUN 27* 32* 24*  CREATININE 1.51* 1.59* 1.19  CALCIUM 9.1 8.7* 9.0  GFRNONAA 46* 43* >60  ANIONGAP 12 13 12     Recent Labs  Lab 01/13/24 1609  PROT 7.5  ALBUMIN 3.6  AST 28   ALT 20  ALKPHOS 52  BILITOT 0.7   Lipids No results for input(s): "CHOL", "TRIG", "HDL", "LABVLDL", "LDLCALC", "CHOLHDL" in the last 168 hours.  Hematology Recent Labs  Lab 01/10/24 1141 01/13/24 1609  WBC 6.3 8.7  RBC 3.14* 2.93*  HGB 9.5* 8.8*  HCT 30.3* 28.6*  MCV 96.5 97.6  MCH 30.3 30.0  MCHC 31.4 30.8  RDW 19.9* 19.6*  PLT 251 235   Thyroid No results for input(s): "TSH", "FREET4" in the last 168 hours.  BNP Recent Labs  Lab 01/13/24 2141  BNP 327.0*    DDimer No results for input(s): "DDIMER" in the last 168 hours.   Radiology/Studies:  DG Chest Port 1 View Result Date: 01/13/2024 CLINICAL DATA:  Shortness of breath. EXAM: PORTABLE CHEST 1 VIEW COMPARISON:  Chest radiograph dated 01/02/2024. FINDINGS: Shallow inspiration. Left lung base density, progressed since the prior radiograph and may represent atelectasis or infiltrate. A small left pleural effusion is not excluded no pneumothorax. Stable cardiac silhouette. No acute osseous pathology. IMPRESSION: Left lung base atelectasis or infiltrate. Electronically Signed   By: Elgie Collard M.D.   On: 01/13/2024 16:33     Assessment and Plan:   1.Acute on chronic HFpEF - 12/2023 echo: LVEF 55-60%, indet diastolic, mild RV dysfunction, severe pulm HTN PASP 63, dilated fixed IVC,  -recent discharge weight 244 lbs, up to 251 on admission. Standing weight today 243 lbs.  - CXR LLL atelectasis vs infiltrate. BNP 327. - started on IV lasix 40mg  bid. Negative 2.3 L thus far, Downtrend in Cr with diuresis consistent with venous congestion and HF.  - continue IV lasix - off coreg due to bradycardia in the past  - remains fluid overloaded, continue IV diuresis.    2.CAD -2016 cath D1 occluded, 50% prox to mid LAD, 40% RPDA - due to progressing DOE Dr Diona Browner had planned for outpatient RHC/LHC that is currnetly on hold - would need to follow his Hgb prior to committing to cath, would also need to be euvolemic with  pneumonia resolved. His outpatient study was cancelled, no plans to arrange as inpatient at this time, reassess at outpatient f/u timing  3. Iron deficient anemia - was to have EGD with push enteroscopy.  - from notes history of small bowel AVMs - 12/2023 EGD/enterscopy 2 small nonbleeding angiodysplastic lesions that were interved on - Hgb down further this admit, follow.  - from recent heme note thought occult GI bleeding, B12 deficient. Plan for IV iron, B12 replacement, close monitoring.   4. PAF - has watchman device - low amplitude p wave but from EKG and tele looks to be in SR  5. Pulmonary HTN - 12/2023 echo: LVEF 55-60%, indet diastolic, mild RV dysfunction, severe pulm HTN PASP 63, dilated fixed IVC,  - plans had been for outpatient RHC -  has history of OSA, HFpEF. Unclear if any additional etiologies. 12/2023 CT PE negative for PE - RHC when able as outpatient.   6. Possible pnuemonia - abx per primary team   For questions or updates, please contact [facility] Please consult [email or web address] for contact info under    Signed, Dina Rich, MD  01/15/2024 9:10 AM

## 2024-01-15 NOTE — Plan of Care (Signed)

## 2024-01-15 NOTE — Progress Notes (Signed)
 Smog enema given. Patient had large bowel movement on BSC. States he feels relief.

## 2024-01-16 ENCOUNTER — Inpatient Hospital Stay

## 2024-01-16 DIAGNOSIS — J9601 Acute respiratory failure with hypoxia: Secondary | ICD-10-CM | POA: Diagnosis not present

## 2024-01-16 LAB — GLUCOSE, CAPILLARY
Glucose-Capillary: 166 mg/dL — ABNORMAL HIGH (ref 70–99)
Glucose-Capillary: 243 mg/dL — ABNORMAL HIGH (ref 70–99)
Glucose-Capillary: 196 mg/dL — ABNORMAL HIGH (ref 70–99)
Glucose-Capillary: 278 mg/dL — ABNORMAL HIGH (ref 70–99)

## 2024-01-16 LAB — CBC
HCT: 27.4 % — ABNORMAL LOW (ref 39.0–52.0)
Hemoglobin: 8.5 g/dL — ABNORMAL LOW (ref 13.0–17.0)
MCH: 29.7 pg (ref 26.0–34.0)
MCHC: 31 g/dL (ref 30.0–36.0)
MCV: 95.8 fL (ref 80.0–100.0)
Platelets: 252 10*3/uL (ref 150–400)
RBC: 2.86 MIL/uL — ABNORMAL LOW (ref 4.22–5.81)
RDW: 18.5 % — ABNORMAL HIGH (ref 11.5–15.5)
WBC: 8.7 10*3/uL (ref 4.0–10.5)
nRBC: 0 % (ref 0.0–0.2)

## 2024-01-16 LAB — BASIC METABOLIC PANEL WITH GFR
Anion gap: 15 (ref 5–15)
BUN: 20 mg/dL (ref 8–23)
CO2: 26 mmol/L (ref 22–32)
Calcium: 8.4 mg/dL — ABNORMAL LOW (ref 8.9–10.3)
Chloride: 94 mmol/L — ABNORMAL LOW (ref 98–111)
Creatinine, Ser: 1.27 mg/dL — ABNORMAL HIGH (ref 0.61–1.24)
GFR, Estimated: 57 mL/min — ABNORMAL LOW (ref >60)
Glucose, Bld: 167 mg/dL — ABNORMAL HIGH (ref 70–99)
Potassium: 4.2 mmol/L (ref 3.5–5.1)
Sodium: 135 mmol/L (ref 135–145)

## 2024-01-16 LAB — MAGNESIUM: Magnesium: 1.7 mg/dL (ref 1.7–2.4)

## 2024-01-16 MED ORDER — SODIUM CHLORIDE 0.9 % IV SOLN
100.0000 mg | Freq: Once | INTRAVENOUS | Status: AC
  Administered 2024-01-16: 100 mg via INTRAVENOUS
  Filled 2024-01-16: qty 5

## 2024-01-16 MED ORDER — FUROSEMIDE 10 MG/ML IJ SOLN
20.0000 mg | Freq: Once | INTRAMUSCULAR | Status: AC
  Administered 2024-01-16: 20 mg via INTRAVENOUS
  Filled 2024-01-16: qty 2

## 2024-01-16 MED ORDER — FUROSEMIDE 10 MG/ML IJ SOLN
60.0000 mg | Freq: Two times a day (BID) | INTRAMUSCULAR | Status: DC
  Administered 2024-01-16 – 2024-01-17 (×2): 60 mg via INTRAVENOUS
  Filled 2024-01-16 (×2): qty 6

## 2024-01-16 MED ORDER — FUROSEMIDE 10 MG/ML IJ SOLN
40.0000 mg | Freq: Two times a day (BID) | INTRAMUSCULAR | Status: AC
  Administered 2024-01-16: 40 mg via INTRAVENOUS
  Filled 2024-01-16: qty 4

## 2024-01-16 MED ORDER — MAGNESIUM SULFATE 2 GM/50ML IV SOLN
2.0000 g | Freq: Once | INTRAVENOUS | Status: AC
  Administered 2024-01-16: 2 g via INTRAVENOUS
  Filled 2024-01-16: qty 50

## 2024-01-16 NOTE — Progress Notes (Signed)
 Patient has remained on 2L nasal cannula throughout this shift. Oxygen removed while patient sitting in recliner. After approximately 2 minutes, oxygen saturation was 84% on room air. Nasal cannula reapplied at 2L and O2 sat increased to 98%. O2 flow decreased to 1.5L and patient able to maintain 97%.

## 2024-01-16 NOTE — Progress Notes (Signed)
 Rounding Note    Patient Name: Lucas Dudley Date of Encounter: 01/16/2024  [facility] Cardiologist: Nona Dell, MD   Subjective   Some ongoing SOB  Inpatient Medications    Scheduled Meds:  amiodarone  200 mg Oral QHS   aspirin  81 mg Oral Daily   docusate sodium  100 mg Oral BID   doxycycline  100 mg Oral Q12H   enoxaparin (LOVENOX) injection  55 mg Subcutaneous Q24H   furosemide  40 mg Intravenous BID   insulin aspart  0-15 Units Subcutaneous TID WC   insulin aspart  0-5 Units Subcutaneous QHS   insulin glargine-yfgn  20 Units Subcutaneous QHS   pantoprazole  40 mg Oral Daily   polyethylene glycol  17 g Oral Daily   Continuous Infusions:  iron sucrose     PRN Meds: acetaminophen, albuterol, diphenhydrAMINE, naphazoline-glycerin, traZODone   Vital Signs    Vitals:   01/16/24 0222 01/16/24 0415 01/16/24 0500 01/16/24 0700  BP:  (!) 140/51    Pulse:  66    Resp:  20    Temp:  99.2 F (37.3 C)    TempSrc:  Oral    SpO2: 93% 94%  97%  Weight:   110.8 kg   Height:        Intake/Output Summary (Last 24 hours) at 01/16/2024 0837 Last data filed at 01/16/2024 0252 Gross per 24 hour  Intake 720 ml  Output 1250 ml  Net -530 ml      01/16/2024    5:00 AM 01/15/2024    5:00 AM 01/13/2024    7:52 PM  Last 3 Weights  Weight (lbs) 244 lb 4.3 oz 243 lb 6.2 oz 246 lb 14.6 oz  Weight (kg) 110.8 kg 110.4 kg 112 kg      Telemetry    SR - Personally Reviewed  ECG    N/a - Personally Reviewed  Physical Exam   GEN: No acute distress.   Neck: No JVD Cardiac: RRR, no murmurs, rubs, or gallops.  Respiratory: mild crackles bilateral bases GI: Soft, nontender, non-distended  MS: 2+ bilateral LE edema Neuro:  Nonfocal  Psych: Normal affect   Labs    High Sensitivity Troponin:   Recent Labs  Lab 01/13/24 1609 01/13/24 1739  TROPONINIHS 31* 34*     Chemistry Recent Labs  Lab 01/13/24 1609 01/15/24 0435 01/16/24 0500  NA 135 136 135  K  3.7 3.3* 4.2  CL 94* 92* 94*  CO2 28 32 26  GLUCOSE 235* 160* 167*  BUN 32* 24* 20  CREATININE 1.59* 1.19 1.27*  CALCIUM 8.7* 9.0 8.4*  MG  --  1.7 1.7  PROT 7.5  --   --   ALBUMIN 3.6  --   --   AST 28  --   --   ALT 20  --   --   ALKPHOS 52  --   --   BILITOT 0.7  --   --   GFRNONAA 43* >60 57*  ANIONGAP 13 12 15     Lipids No results for input(s): "CHOL", "TRIG", "HDL", "LABVLDL", "LDLCALC", "CHOLHDL" in the last 168 hours.  Hematology Recent Labs  Lab 01/13/24 1609 01/15/24 0435 01/16/24 0500  WBC 8.7 7.9 8.7  RBC 2.93* 2.98* 2.86*  HGB 8.8* 8.9* 8.5*  HCT 28.6* 29.6* 27.4*  MCV 97.6 99.3 95.8  MCH 30.0 29.9 29.7  MCHC 30.8 30.1 31.0  RDW 19.6* 19.2* 18.5*  PLT 235 254 252  Thyroid No results for input(s): "TSH", "FREET4" in the last 168 hours.  BNP Recent Labs  Lab 01/13/24 2141  BNP 327.0*    DDimer No results for input(s): "DDIMER" in the last 168 hours.   Radiology    No results found.  Cardiac Studies    Patient Profile     Lucas Dudley is a 83 y.o. male with a hx of  HFpEF, CAD with occluded diagonal and moderate disease of LAD and PDA, persistent afib with watchman, OSA, DM, iron deficient anemia who is being seen 01/15/2024 for the evaluation of SOB at the request of Dr Sherryll Burger.   Assessment & Plan    1.Acute on chronic HFpEF - 12/2023 echo: LVEF 55-60%, indet diastolic, mild RV dysfunction, severe pulm HTN PASP 63, dilated fixed IVC,  -recent discharge weight 244 lbs, up to 251 on admission. Standing weight today 244 lbs.  - CXR LLL atelectasis vs infiltrate. BNP 327. - started on IV lasix 40mg  bid. Negative yesterday and 2.8 L since admission. Labile Cr without clear trend. Remains fluid overloaded, increase IV lasix to 60mg  bid.  - off coreg due to bradycardia in the past from notes  - had been on oral toresmide 20mg  in AM and 40mg  in PM, at discharge would plan for 40mg  bid.  -consider SGLT2i closer to discharge     2.CAD -2016 cath  D1 occluded, 50% prox to mid LAD, 40% RPDA - due to progressing DOE Dr Diona Browner had planned for outpatient RHC/LHC that is currnetly on hold - would need to follow his Hgb prior to committing to cath, would also need to be euvolemic with pneumonia resolved. His outpatient study was cancelled, no plans to arrange as inpatient at this time, reassess at outpatient f/u timing   3. Iron deficient anemia - was to have EGD with push enteroscopy.  - from notes history of small bowel AVMs - 12/2023 EGD/enterscopy 2 small nonbleeding angiodysplastic lesions that were interved on - Hgb down further this admit, follow.  - from recent heme note thought occult GI bleeding, B12 deficient. Plan for IV iron, B12 replacement, close monitoring.    4. PAF - has watchman device - low amplitude p wave but from EKG and tele looks to be in SR   5. 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 Unclear if any additional etiologies. 12/2023 CT PE negative for PE - RHC when able as outpatient.    6. Possible pnuemonia - abx per primary team    For questions or updates, please contact [facility] Please consult [email or web address] for contact info under        Signed, Dina Rich, MD  01/16/2024, 8:37 AM

## 2024-01-16 NOTE — Progress Notes (Signed)
 PROGRESS NOTE    Lucas Dudley  AOZ:[id number] DOB: [birth year] DOA: 01/13/2024 PCP: Anabel Halon, MD   Brief Narrative:    Lucas Dudley is a 83 y.o. male with medical history significant for systolic and diastolic CHF, prostate cancer, hypertension, paroxysmal atrial fibrillation, OSA, diabetes mellitus. Patient was sent to the ED from heme-onc office with reports of hypoxia-sats down to 84 to 90% on room air.  She reports over the past few days he has had worsening dyspnea with exertion, which improves with rest.  Reports chronic unchanged cough.  Chronic bilateral lower extremity edema, and he denies abdominal bloating.[name]  He is unaware of any weight gain. He reports compliance with his medications include torsemide 40 mg daily.    Recent hospitalized-3/1 to 3/4 for decompensated CHF, hypoxia.  Weight on discharge was 244.2 pounds.   He was recently seen in cardiology clinic 01/02/2024 with complaints of dyspnea on exertion and fatigue.  Per notes- patient with evidence of HFpEF, likely contributing, and RV dysfunction with evidence of potentially severe pulmonary hypertension by echo.  Also possible progression of his coronary artery disease which was documented 2016, he was scheduled for cardiac cath tomorrow.  Assessment & Plan:   Principal Problem:   Acute hypoxic respiratory failure (HCC) Active Problems:   Acute on chronic combined systolic and diastolic CHF (congestive heart failure) (HCC) - dry Weight 109 kg/240.3 lbs. dry weight BNP of 111   Essential hypertension   CAD in native artery   Paroxysmal atrial fibrillation (HCC)   Type 2 diabetes mellitus with other specified complication (HCC)  Assessment and Plan:  Acute hypoxic respiratory failure (HCC) -O2 sats ranging from 84 to 90% on room air.   -Patient reporting short winded sensation with activity and is requiring 2 L supplementation's to maintain saturation above 90%.  -X-rays at time of admission demonstrating  concern for bronchitic changes/atelectasis -Initially IV antibiotics has been started for presumed pneumonia -Continue treatment with diuresis -Follow daily weights, strict I's and O's and low-sodium diet -Wean off oxygen supplementation as tolerated -Patient instructed to use flutter valve. -Procalcitonin low, discontinue doxycycline   Acute on chronic combined systolic and diastolic CHF (congestive heart failure) (HCC) - dry Weight 109 kg/240.3 lbs. dry weight BNP of 111 -Dyspnea on exertion, hypoxia, chronic bilateral lower extremity swelling.  Recent discharge with 244.2 Lbs, today he is weighing 244 pounds -As mentioned above continue IV diuresis -Reds clip measurement has been requested -Low-sodium diet discussed with patient. -Appreciate cardiology evaluation with adjustment of Lasix to 60 mg IV twice daily for further aggressive diuresis   CAD in native artery Denies chest pain at this time.  Troponin 31 > 34.  EKG unchanged.   - Recent Cardiology notes for [facility]- 01/02/2024- patient with evidence of HFpEF, likely contributing, and RV dysfunction with evidence of potentially severe pulmonary hypertension by echo.  Also possible progression of his coronary artery disease which was documented 2016, he was scheduled for cardiac cath tomorrow- 3/31. - EDP talked with cardiology on-call, admit here, cardiac cath will be re-scheduled. -Patient denies chest pain -Continue telemetry monitoring -Continue treatment with aspirin.   Essential hypertension-stable -Holding hydralazine in order to provide room for diuresis -Blood pressure currently controlled with Lasix twice a day -Heart healthy/low-sodium diet discussed with patient -Continue to follow vital signs fluctuation.  Iron deficiency anemia -Supplement IV iron and B12 -Monitor hemoglobin trend -Thought to have occult bleeding and follows hematology   Type 2 diabetes mellitus with other  specified complication (HCC) A1c  8.3. -Continue sliding scale insulin and Semglee -Holding oral hypoglycemic agent -Follow CBG fluctuation.   Paroxysmal atrial fibrillation (HCC) -Currently in sinus rhythm.  Follows with cardiology. -Status post watchman's device.  He is not on anticoagulation -Continue telemetry monitoring -Continue to follow ultralights and replete as needed; goal is for potassium above 4 and magnesium above 2   Class II obesity -Body mass index is 35.43 kg/m. -Low-calorie diet, portion control and increase physical activity discussed with patient.    DVT prophylaxis: Lovenox Code Status: Full Family Communication: Daughter on phone 4/2 Disposition Plan:  Status is: Inpatient Remains inpatient appropriate because: Need for IV medications.   Consultants:  Cardiology  Procedures:  None  Antimicrobials:  Anti-infectives (From admission, onward)    Start     Dose/Rate Route Frequency Ordered Stop   01/14/24 2200  doxycycline (VIBRA-TABS) tablet 100 mg  Status:  Discontinued        100 mg Oral Every 12 hours 01/14/24 1702 01/16/24 0937   01/13/24 2200  cefTRIAXone (ROCEPHIN) 2 g in sodium chloride 0.9 % 100 mL IVPB  Status:  Discontinued        2 g 200 mL/hr over 30 Minutes Intravenous Every 24 hours 01/13/24 2006 01/14/24 1702   01/13/24 2200  doxycycline (VIBRAMYCIN) 100 mg in sodium chloride 0.9 % 250 mL IVPB  Status:  Discontinued        100 mg 125 mL/hr over 120 Minutes Intravenous Every 12 hours 01/13/24 2006 01/14/24 1702       Subjective: Patient seen and evaluated today with no new acute complaints or concerns. No acute concerns or events noted overnight.  Continues to have some shortness of breath, but is improving.  Objective: Vitals:   01/16/24 0222 01/16/24 0415 01/16/24 0500 01/16/24 0700  BP:  (!) 140/51    Pulse:  66    Resp:  20    Temp:  99.2 F (37.3 C)    TempSrc:  Oral    SpO2: 93% 94%  97%  Weight:   110.8 kg   Height:        Intake/Output Summary  (Last 24 hours) at 01/16/2024 0937 Last data filed at 01/16/2024 0252 Gross per 24 hour  Intake 720 ml  Output 1250 ml  Net -530 ml   Filed Weights   01/13/24 1952 01/15/24 0500 01/16/24 0500  Weight: 112 kg 110.4 kg 110.8 kg    Examination:  General exam: Appears calm and comfortable  Respiratory system: Clear to auscultation. Respiratory effort normal.  2 L nasal cannula Cardiovascular system: S1 & S2 heard, RRR.  Gastrointestinal system: Abdomen is soft Central nervous system: Alert and awake Extremities: 1+ bilateral ankle edema Skin: No significant lesions noted Psychiatry: Flat affect.    Data Reviewed: I have personally reviewed following labs and imaging studies  CBC: Recent Labs  Lab 01/10/24 1141 01/13/24 1609 01/15/24 0435 01/16/24 0500  WBC 6.3 8.7 7.9 8.7  NEUTROABS  --  7.1  --   --   HGB 9.5* 8.8* 8.9* 8.5*  HCT 30.3* 28.6* 29.6* 27.4*  MCV 96.5 97.6 99.3 95.8  PLT 251 235 254 252   Basic Metabolic Panel: Recent Labs  Lab 01/10/24 1141 01/13/24 1609 01/15/24 0435 01/16/24 0500  NA 136 135 136 135  K 3.9 3.7 3.3* 4.2  CL 94* 94* 92* 94*  CO2 30 28 32 26  GLUCOSE 248* 235* 160* 167*  BUN 27* 32* 24* 20  CREATININE  1.51* 1.59* 1.19 1.27*  CALCIUM 9.1 8.7* 9.0 8.4*  MG  --   --  1.7 1.7   GFR: Estimated Creatinine Clearance: 56.8 mL/min (A) (by C-G formula based on SCr of 1.27 mg/dL (H)). Liver Function Tests: Recent Labs  Lab 01/13/24 1609  AST 28  ALT 20  ALKPHOS 52  BILITOT 0.7  PROT 7.5  ALBUMIN 3.6   No results for input(s): "LIPASE", "AMYLASE" in the last 168 hours. No results for input(s): "AMMONIA" in the last 168 hours. Coagulation Profile: No results for input(s): "INR", "PROTIME" in the last 168 hours. Cardiac Enzymes: No results for input(s): "CKTOTAL", "CKMB", "CKMBINDEX", "TROPONINI" in the last 168 hours. BNP (last 3 results) No results for input(s): "PROBNP" in the last 8760 hours. HbA1C: No results for input(s):  "HGBA1C" in the last 72 hours. CBG: Recent Labs  Lab 01/15/24 0818 01/15/24 1135 01/15/24 1705 01/15/24 2008 01/16/24 0749  GLUCAP 156* 202* 199* 252* 166*   Lipid Profile: No results for input(s): "CHOL", "HDL", "LDLCALC", "TRIG", "CHOLHDL", "LDLDIRECT" in the last 72 hours. Thyroid Function Tests: No results for input(s): "TSH", "T4TOTAL", "FREET4", "T3FREE", "THYROIDAB" in the last 72 hours. Anemia Panel: No results for input(s): "VITAMINB12", "FOLATE", "FERRITIN", "TIBC", "IRON", "RETICCTPCT" in the last 72 hours. Sepsis Labs: Recent Labs  Lab 01/15/24 0435  PROCALCITON <0.10    No results found for this or any previous visit (from the past 240 hours).       Radiology Studies: No results found.       Scheduled Meds:  amiodarone  200 mg Oral QHS   aspirin  81 mg Oral Daily   docusate sodium  100 mg Oral BID   enoxaparin (LOVENOX) injection  55 mg Subcutaneous Q24H   furosemide  60 mg Intravenous BID   insulin aspart  0-15 Units Subcutaneous TID WC   insulin aspart  0-5 Units Subcutaneous QHS   insulin glargine-yfgn  20 Units Subcutaneous QHS   pantoprazole  40 mg Oral Daily   polyethylene glycol  17 g Oral Daily   Continuous Infusions:  iron sucrose     magnesium sulfate bolus IVPB 2 g (01/16/24 0918)     LOS: 3 days    Time spent: 55 minutes    Radek Carnero D Sherryll Burger, DO [facility]  If 7PM-7AM, please contact night-coverage [email or web address] 01/16/2024, 9:37 AM

## 2024-01-17 ENCOUNTER — Encounter: Payer: Self-pay | Admitting: Physician Assistant

## 2024-01-17 ENCOUNTER — Encounter: Payer: Self-pay | Admitting: Gastroenterology

## 2024-01-17 ENCOUNTER — Other Ambulatory Visit (HOSPITAL_COMMUNITY): Payer: Self-pay

## 2024-01-17 ENCOUNTER — Telehealth (HOSPITAL_COMMUNITY): Payer: Self-pay

## 2024-01-17 DIAGNOSIS — J9601 Acute respiratory failure with hypoxia: Secondary | ICD-10-CM | POA: Diagnosis not present

## 2024-01-17 LAB — GLUCOSE, CAPILLARY
Glucose-Capillary: 226 mg/dL — ABNORMAL HIGH (ref 70–99)
Glucose-Capillary: 287 mg/dL — ABNORMAL HIGH (ref 70–99)
Glucose-Capillary: 252 mg/dL — ABNORMAL HIGH (ref 70–99)
Glucose-Capillary: 320 mg/dL — ABNORMAL HIGH (ref 70–99)

## 2024-01-17 LAB — BASIC METABOLIC PANEL WITH GFR
Anion gap: 10 (ref 5–15)
BUN: 23 mg/dL (ref 8–23)
CO2: 32 mmol/L (ref 22–32)
Calcium: 9 mg/dL (ref 8.9–10.3)
Chloride: 90 mmol/L — ABNORMAL LOW (ref 98–111)
Creatinine, Ser: 1.5 mg/dL — ABNORMAL HIGH (ref 0.61–1.24)
GFR, Estimated: 46 mL/min — ABNORMAL LOW (ref >60)
Glucose, Bld: 292 mg/dL — ABNORMAL HIGH (ref 70–99)
Potassium: 3.8 mmol/L (ref 3.5–5.1)
Sodium: 132 mmol/L — ABNORMAL LOW (ref 135–145)

## 2024-01-17 LAB — CBC
HCT: 28.2 % — ABNORMAL LOW (ref 39.0–52.0)
Hemoglobin: 8.7 g/dL — ABNORMAL LOW (ref 13.0–17.0)
MCH: 29.6 pg (ref 26.0–34.0)
MCHC: 30.9 g/dL (ref 30.0–36.0)
MCV: 95.9 fL (ref 80.0–100.0)
Platelets: 278 10*3/uL (ref 150–400)
RBC: 2.94 MIL/uL — ABNORMAL LOW (ref 4.22–5.81)
RDW: 18.9 % — ABNORMAL HIGH (ref 11.5–15.5)
WBC: 9.9 10*3/uL (ref 4.0–10.5)
nRBC: 0 % (ref 0.0–0.2)

## 2024-01-17 LAB — MAGNESIUM: Magnesium: 1.9 mg/dL (ref 1.7–2.4)

## 2024-01-17 MED ORDER — INSULIN GLARGINE-YFGN 100 UNIT/ML ~~LOC~~ SOLN
25.0000 [IU] | Freq: Every day | SUBCUTANEOUS | Status: DC
  Administered 2024-01-17 – 2024-01-19 (×3): 25 [IU] via SUBCUTANEOUS
  Filled 2024-01-17 (×4): qty 0.25

## 2024-01-17 MED ORDER — FUROSEMIDE 10 MG/ML IJ SOLN
20.0000 mg | Freq: Once | INTRAMUSCULAR | Status: AC
  Administered 2024-01-17: 20 mg via INTRAVENOUS
  Filled 2024-01-17: qty 2

## 2024-01-17 MED ORDER — FUROSEMIDE 10 MG/ML IJ SOLN
80.0000 mg | Freq: Two times a day (BID) | INTRAMUSCULAR | Status: DC
  Administered 2024-01-17 – 2024-01-18 (×3): 80 mg via INTRAVENOUS
  Filled 2024-01-17 (×3): qty 8

## 2024-01-17 MED ORDER — DAPAGLIFLOZIN PROPANEDIOL 10 MG PO TABS
10.0000 mg | ORAL_TABLET | Freq: Every day | ORAL | Status: DC
  Administered 2024-01-18 – 2024-01-28 (×11): 10 mg via ORAL
  Filled 2024-01-17 (×12): qty 1

## 2024-01-17 NOTE — Progress Notes (Signed)
 PROGRESS NOTE    Lucas Dudley  WUJ:[id number] DOB: 1941/10/10 DOA: 01/13/2024 PCP: Anabel Halon, MD   Brief Narrative:    Lucas Dudley is a 83 y.o. male with medical history significant for systolic and diastolic CHF, prostate cancer, hypertension, paroxysmal atrial fibrillation, OSA, diabetes mellitus. Patient was sent to the ED from heme-onc office with reports of hypoxia-sats down to 84 to 90% on room air.  She reports over the past few days he has had worsening dyspnea with exertion, which improves with rest.  Reports chronic unchanged cough.  Chronic bilateral lower extremity edema, and he denies abdominal bloating.[name]  He is unaware of any weight gain. He reports compliance with his medications include torsemide 40 mg daily.    Recent hospitalized-3/1 to 3/4 for decompensated CHF, hypoxia.  Weight on discharge was 244.2 pounds.   He was recently seen in cardiology clinic 01/02/2024 with complaints of dyspnea on exertion and fatigue.  Per notes- patient with evidence of HFpEF, likely contributing, and RV dysfunction with evidence of potentially severe pulmonary hypertension by echo.  Also possible progression of his coronary artery disease which was documented 2016, he was scheduled for cardiac cath tomorrow.  Assessment & Plan:   Principal Problem:   Acute hypoxic respiratory failure (HCC) Active Problems:   Acute on chronic combined systolic and diastolic CHF (congestive heart failure) (HCC) - dry Weight 109 kg/240.3 lbs. dry weight BNP of 111   Essential hypertension   CAD in native artery   Paroxysmal atrial fibrillation (HCC)   Type 2 diabetes mellitus with other specified complication (HCC)  Assessment and Plan:  Acute hypoxic respiratory failure (HCC) -O2 sats ranging from 84 to 90% on room air.   -Patient reporting short winded sensation with activity and is requiring 2 L supplementation's to maintain saturation above 90%.  -X-rays at time of admission demonstrating  concern for bronchitic changes/atelectasis -Initially IV antibiotics has been started for presumed pneumonia -Continue treatment with diuresis with adjustment as noted below -Follow daily weights, strict I's and O's and low-sodium diet -Wean off oxygen supplementation as tolerated -Patient instructed to use flutter valve. -Procalcitonin low, discontinue doxycycline   Acute on chronic combined systolic and diastolic CHF (congestive heart failure) (HCC) - dry Weight 109 kg/240.3 lbs. dry weight BNP of 111 -Dyspnea on exertion, hypoxia, chronic bilateral lower extremity swelling.  Recent discharge with 244.2 Lbs, today he is weighing 244 pounds -As mentioned above continue IV diuresis -Reds clip measurement has been requested -Low-sodium diet discussed with patient. -Appreciate cardiology evaluation with adjustment of Lasix to 80 mg IV twice daily for further aggressive diuresis   CAD in native artery Denies chest pain at this time.  Troponin 31 > 34.  EKG unchanged.   - Recent Cardiology notes for [facility]- 01/02/2024- patient with evidence of HFpEF, likely contributing, and RV dysfunction with evidence of potentially severe pulmonary hypertension by echo.  Also possible progression of his coronary artery disease which was documented 2016, he was scheduled for cardiac cath tomorrow- 3/31. - EDP talked with cardiology on-call, admit here, cardiac cath will be re-scheduled. -Patient denies chest pain -Continue telemetry monitoring -Continue treatment with aspirin.   Essential hypertension-stable -Holding hydralazine in order to provide room for diuresis -Blood pressure currently controlled with Lasix twice a day -Heart healthy/low-sodium diet discussed with patient -Continue to follow vital signs fluctuation.  Iron deficiency anemia -Supplement IV iron and B12 -Monitor hemoglobin trend -Thought to have occult bleeding and follows hematology   Type  2 diabetes mellitus with other specified  complication (HCC) A1c 8.3. -Continue sliding scale insulin and Semglee -Holding oral hypoglycemic agent -Follow CBG fluctuation.   Paroxysmal atrial fibrillation (HCC) -Currently in sinus rhythm.  Follows with cardiology. -Status post watchman's device.  He is not on anticoagulation -Continue telemetry monitoring -Continue to follow ultralights and replete as needed; goal is for potassium above 4 and magnesium above 2   Class II obesity -Body mass index is 35.43 kg/m. -Low-calorie diet, portion control and increase physical activity discussed with patient.    DVT prophylaxis: Lovenox Code Status: Full Family Communication: Daughter on phone 4/2 Disposition Plan:  Status is: Inpatient Remains inpatient appropriate because: Need for IV medications.   Consultants:  Cardiology  Procedures:  None  Antimicrobials:  Anti-infectives (From admission, onward)    Start     Dose/Rate Route Frequency Ordered Stop   01/14/24 2200  doxycycline (VIBRA-TABS) tablet 100 mg  Status:  Discontinued        100 mg Oral Every 12 hours 01/14/24 1702 01/16/24 0937   01/13/24 2200  cefTRIAXone (ROCEPHIN) 2 g in sodium chloride 0.9 % 100 mL IVPB  Status:  Discontinued        2 g 200 mL/hr over 30 Minutes Intravenous Every 24 hours 01/13/24 2006 01/14/24 1702   01/13/24 2200  doxycycline (VIBRAMYCIN) 100 mg in sodium chloride 0.9 % 250 mL IVPB  Status:  Discontinued        100 mg 125 mL/hr over 120 Minutes Intravenous Every 12 hours 01/13/24 2006 01/14/24 1702       Subjective: Patient seen and evaluated today with no new acute complaints or concerns. No acute concerns or events noted overnight.  Continues to have some shortness of breath, but is improving.  Objective: Vitals:   01/16/24 1816 01/16/24 1817 01/16/24 2034 01/17/24 0500  BP:   136/61 (!) 149/73  Pulse:   64 61  Resp:   20 20  Temp:   98.4 F (36.9 C) 98.9 F (37.2 C)  TempSrc:   Oral Oral  SpO2: 98% 97% 97% 97%   Weight:    112.2 kg  Height:        Intake/Output Summary (Last 24 hours) at 01/17/2024 1120 Last data filed at 01/17/2024 0900 Gross per 24 hour  Intake 585 ml  Output 1650 ml  Net -1065 ml   Filed Weights   01/15/24 0500 01/16/24 0500 01/17/24 0500  Weight: 110.4 kg 110.8 kg 112.2 kg    Examination:  General exam: Appears calm and comfortable  Respiratory system: Clear to auscultation. Respiratory effort normal.  2 L nasal cannula Cardiovascular system: S1 & S2 heard, RRR.  Gastrointestinal system: Abdomen is soft Central nervous system: Alert and awake Extremities: 1+ bilateral ankle edema Skin: No significant lesions noted Psychiatry: Flat affect.    Data Reviewed: I have personally reviewed following labs and imaging studies  CBC: Recent Labs  Lab 01/10/24 1141 01/13/24 1609 01/15/24 0435 01/16/24 0500 01/17/24 0503  WBC 6.3 8.7 7.9 8.7 9.9  NEUTROABS  --  7.1  --   --   --   HGB 9.5* 8.8* 8.9* 8.5* 8.7*  HCT 30.3* 28.6* 29.6* 27.4* 28.2*  MCV 96.5 97.6 99.3 95.8 95.9  PLT 251 235 254 252 278   Basic Metabolic Panel: Recent Labs  Lab 01/10/24 1141 01/13/24 1609 01/15/24 0435 01/16/24 0500 01/17/24 0503  NA 136 135 136 135 132*  K 3.9 3.7 3.3* 4.2 3.8  CL 94* 94*  92* 94* 90*  CO2 30 28 32 26 32  GLUCOSE 248* 235* 160* 167* 292*  BUN 27* 32* 24* 20 23  CREATININE 1.51* 1.59* 1.19 1.27* 1.50*  CALCIUM 9.1 8.7* 9.0 8.4* 9.0  MG  --   --  1.7 1.7 1.9   GFR: Estimated Creatinine Clearance: 48.5 mL/min (A) (by C-G formula based on SCr of 1.5 mg/dL (H)). Liver Function Tests: Recent Labs  Lab 01/13/24 1609  AST 28  ALT 20  ALKPHOS 52  BILITOT 0.7  PROT 7.5  ALBUMIN 3.6   No results for input(s): "LIPASE", "AMYLASE" in the last 168 hours. No results for input(s): "AMMONIA" in the last 168 hours. Coagulation Profile: No results for input(s): "INR", "PROTIME" in the last 168 hours. Cardiac Enzymes: No results for input(s): "CKTOTAL", "CKMB",  "CKMBINDEX", "TROPONINI" in the last 168 hours. BNP (last 3 results) No results for input(s): "PROBNP" in the last 8760 hours. HbA1C: No results for input(s): "HGBA1C" in the last 72 hours. CBG: Recent Labs  Lab 01/16/24 0749 01/16/24 1145 01/16/24 1619 01/16/24 2038 01/17/24 0728  GLUCAP 166* 243* 196* 278* 226*   Lipid Profile: No results for input(s): "CHOL", "HDL", "LDLCALC", "TRIG", "CHOLHDL", "LDLDIRECT" in the last 72 hours. Thyroid Function Tests: No results for input(s): "TSH", "T4TOTAL", "FREET4", "T3FREE", "THYROIDAB" in the last 72 hours. Anemia Panel: No results for input(s): "VITAMINB12", "FOLATE", "FERRITIN", "TIBC", "IRON", "RETICCTPCT" in the last 72 hours. Sepsis Labs: Recent Labs  Lab 01/15/24 0435  PROCALCITON <0.10    No results found for this or any previous visit (from the past 240 hours).       Radiology Studies: No results found.       Scheduled Meds:  amiodarone  200 mg Oral QHS   aspirin  81 mg Oral Daily   dapagliflozin propanediol  10 mg Oral Daily   docusate sodium  100 mg Oral BID   enoxaparin (LOVENOX) injection  55 mg Subcutaneous Q24H   furosemide  20 mg Intravenous Once   furosemide  80 mg Intravenous BID   insulin aspart  0-15 Units Subcutaneous TID WC   insulin aspart  0-5 Units Subcutaneous QHS   insulin glargine-yfgn  25 Units Subcutaneous QHS   pantoprazole  40 mg Oral Daily   polyethylene glycol  17 g Oral Daily     LOS: 4 days    Time spent: 55 minutes    Cardelia Sassano Hoover Brunette, DO [facility]  If 7PM-7AM, please contact night-coverage [email or web address] 01/17/2024, 11:20 AM

## 2024-01-17 NOTE — Care Management Important Message (Signed)
 Important Message  Patient Details  Name: Lucas Dudley MRN: [id number] Date of Birth: [birth year]   Important Message Given:  Yes - Medicare IM     Corey Harold 01/17/2024, 3:21 PM

## 2024-01-17 NOTE — Inpatient Diabetes Management (Signed)
 Inpatient Diabetes Program Recommendations  AACE/ADA: New Consensus Statement on Inpatient Glycemic Control (2015)  Target Ranges:  Prepandial:   less than 140 mg/dL      Peak postprandial:   less than 180 mg/dL (1-2 hours)      Critically ill patients:  140 - 180 mg/dL   Lab Results  Component Value Date   GLUCAP 226 (H) 01/17/2024   HGBA1C 8.3 (H) 11/11/2023    Review of Glycemic Control  Latest Reference Range & Units 01/15/24 20:08 01/16/24 07:49 01/16/24 11:45 01/16/24 16:19 01/16/24 20:38 01/17/24 07:28  Glucose-Capillary 70 - 99 mg/dL 295 (H) 284 (H) 132 (H) 196 (H) 278 (H) 226 (H)  (H): Data is abnormally high Diabetes history: Type 2 DM Outpatient Diabetes medications: Amaryl 2 mg BID, Basaglar 38 units at bedtime, Metformin 500 mg BID Current orders for Inpatient glycemic control: Farxiga 10 mg every day, Novolog 0-15 units TID & HS, Semglee 20 units QHS  Inpatient Diabetes Program Recommendations:    Consider increasing Semglee 25 units at bedtime  Thanks, Lujean Rave, MSN, RNC-OB Diabetes Coordinator [phone] (8a-5p)

## 2024-01-17 NOTE — Telephone Encounter (Signed)
 Pharmacy Patient Advocate Encounter  Insurance verification completed.    The patient is insured through [employer]. Patient has Medicare and is not eligible for a copay card, but may be able to apply for patient assistance or Medicare RX Payment Plan (Patient Must reach out to their plan, if eligible for payment plan), if available.    Ran test claim for Marcelline Deist and the current 30 day co-pay is $30.   This test claim was processed through [employer]- copay amounts may vary at other pharmacies due to [employer], or as the patient moves through the different stages of their insurance plan.

## 2024-01-17 NOTE — Progress Notes (Signed)
 Rounding Note    Patient Name: LESSIE MANIGO Date of Encounter: 01/17/2024  [facility] Cardiologist: Nona Dell, MD   Subjective   Some ongoing SOB  Inpatient Medications    Scheduled Meds:  amiodarone  200 mg Oral QHS   aspirin  81 mg Oral Daily   docusate sodium  100 mg Oral BID   enoxaparin (LOVENOX) injection  55 mg Subcutaneous Q24H   furosemide  60 mg Intravenous BID   insulin aspart  0-15 Units Subcutaneous TID WC   insulin aspart  0-5 Units Subcutaneous QHS   insulin glargine-yfgn  20 Units Subcutaneous QHS   pantoprazole  40 mg Oral Daily   polyethylene glycol  17 g Oral Daily   Continuous Infusions:  PRN Meds: acetaminophen, albuterol, diphenhydrAMINE, naphazoline-glycerin, traZODone   Vital Signs    Vitals:   01/16/24 1816 01/16/24 1817 01/16/24 2034 01/17/24 0500  BP:   136/61 (!) 149/73  Pulse:   64 61  Resp:   20 20  Temp:   98.4 F (36.9 C) 98.9 F (37.2 C)  TempSrc:   Oral Oral  SpO2: 98% 97% 97% 97%  Weight:    112.2 kg  Height:        Intake/Output Summary (Last 24 hours) at 01/17/2024 0851 Last data filed at 01/16/2024 2100 Gross per 24 hour  Intake 345 ml  Output 1300 ml  Net -955 ml      01/17/2024    5:00 AM 01/16/2024    5:00 AM 01/15/2024    5:00 AM  Last 3 Weights  Weight (lbs) 247 lb 5.7 oz 244 lb 4.3 oz 243 lb 6.2 oz  Weight (kg) 112.2 kg 110.8 kg 110.4 kg      Telemetry    SR - Personally Reviewed  ECG    N/a - Personally Reviewed  Physical Exam   GEN: No acute distress.   Neck: No JVD Cardiac: RRR, no murmurs, rubs, or gallops.  Respiratory: mild crackles bilateral bases GI: Soft, nontender, non-distended  MS: 2+ bilateral LE edema Neuro:  Nonfocal  Psych: Normal affect   Labs    High Sensitivity Troponin:   Recent Labs  Lab 01/13/24 1609 01/13/24 1739  TROPONINIHS 31* 34*     Chemistry Recent Labs  Lab 01/13/24 1609 01/15/24 0435 01/16/24 0500 01/17/24 0503  NA 135 136 135 132*   K 3.7 3.3* 4.2 3.8  CL 94* 92* 94* 90*  CO2 28 32 26 32  GLUCOSE 235* 160* 167* 292*  BUN 32* 24* 20 23  CREATININE 1.59* 1.19 1.27* 1.50*  CALCIUM 8.7* 9.0 8.4* 9.0  MG  --  1.7 1.7 1.9  PROT 7.5  --   --   --   ALBUMIN 3.6  --   --   --   AST 28  --   --   --   ALT 20  --   --   --   ALKPHOS 52  --   --   --   BILITOT 0.7  --   --   --   GFRNONAA 43* >60 57* 46*  ANIONGAP 13 12 15 10     Lipids No results for input(s): "CHOL", "TRIG", "HDL", "LABVLDL", "LDLCALC", "CHOLHDL" in the last 168 hours.  Hematology Recent Labs  Lab 01/15/24 0435 01/16/24 0500 01/17/24 0503  WBC 7.9 8.7 9.9  RBC 2.98* 2.86* 2.94*  HGB 8.9* 8.5* 8.7*  HCT 29.6* 27.4* 28.2*  MCV 99.3 95.8 95.9  MCH 29.9 29.7 29.6  MCHC 30.1 31.0 30.9  RDW 19.2* 18.5* 18.9*  PLT 254 252 278   Thyroid No results for input(s): "TSH", "FREET4" in the last 168 hours.  BNP Recent Labs  Lab 01/13/24 2141  BNP 327.0*    DDimer No results for input(s): "DDIMER" in the last 168 hours.   Radiology    No results found.  Cardiac Studies    Patient Profile     KEILON RESSEL is a 83 y.o. male with a hx of HFpEF, CAD with occluded diagonal and moderate disease of LAD and PDA, persistent afib with watchman, OSA, DM, iron deficient anemia who is being seen 01/15/2024 for the evaluation of SOB at the request of Dr Sherryll Burger.   Assessment & Plan    1.Acute on chronic HFpEF - 12/2023 echo: LVEF 55-60%, indet diastolic, mild RV dysfunction, severe pulm HTN PASP 63, dilated fixed IVC,  -recent discharge weight 244 lbs, up to 251 on admission. Standing weight today 244 lbs.  - CXR LLL atelectasis vs infiltrate. BNP 327. - on IV lasix 60mg  bid. Negative 955 mL yesterday and 3.8 L since admission. Mild variations in Cr without clear trend. Ongoing fluid overload, icnrease IV lasix to 80mg  bid.  - of note he reports chronic leg edema even when otherwise euvolemic   - off coreg due to bradycardia in the past from notes  - had  been on oral toresmide 20mg  in AM and 40mg  in PM, at discharge would plan for 40mg  bid.  -start farxiga 10mg  daily, place case management consult     2.CAD -2016 cath D1 occluded, 50% prox to mid LAD, 40% RPDA - due to progressing DOE Dr Diona Browner had planned for outpatient RHC/LHC that is currnetly on hold - would need to follow his Hgb prior to committing to cath, would also need to be euvolemic with pneumonia resolved. His outpatient study was cancelled, no plans to arrange as inpatient at this time, reassess at outpatient f/u timing   3. Iron deficient anemia - was to have EGD with push enteroscopy.  - from notes history of small bowel AVMs - 12/2023 EGD/enterscopy 2 small nonbleeding angiodysplastic lesions that were interved on - Hgb down further this admit, follow.  - from recent heme note thought occult GI bleeding, B12 deficient. Plan for IV iron, B12 replacement, close monitoring.    4. PAF - has watchman device - low amplitude p wave but from EKG and tele looks to be in SR   5. 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 Unclear if any additional etiologies. 12/2023 CT PE negative for PE - RHC when able as outpatient.    6. Possible pnuemonia - abx per primary team  For questions or updates, please contact [facility] Please consult [email or web address] for contact info under        Signed, Dina Rich, MD  01/17/2024, 8:51 AM

## 2024-01-17 NOTE — TOC Progression Note (Signed)
 Transition of Care [facility]) - Progression Note    Patient Details  Name: Lucas Dudley MRN: [id number] Date of Birth: February 06, 1941  Transition of Care [facility]) CM/SW Contact  Villa Herb, [place] Phone Number: 01/17/2024, 10:26 AM  Clinical Narrative:    CSW updated that pt will likely need home O2 at D/C once medically stable. CSW spoke with pt about agency preference and he does not have one but is requesting a POC. CSW updated that referral can be sent to [facility] and they can assist with getting POC after home set up is complete. Pt is agreeable to this. TOC to follow.   Expected Discharge Plan: Home/Self Care Barriers to Discharge: Continued Medical Work up  Expected Discharge Plan and Services In-house Referral: Clinical Social Work Discharge Planning Services: CM Consult   Living arrangements for the past 2 months: Single Family Home                                       Social Determinants of Health (SDOH) Interventions SDOH Screenings   Food Insecurity: Patient Declined (01/14/2024)  Housing: Patient Declined (01/14/2024)  Transportation Needs: Patient Declined (01/14/2024)  Utilities: Patient Declined (01/14/2024)  Alcohol Screen: Low Risk  (11/20/2023)  Depression (PHQ2-9): Low Risk  (12/31/2023)  Financial Resource Strain: Low Risk  (12/17/2023)  Physical Activity: Inactive (11/20/2023)  Social Connections: Patient Declined (01/14/2024)  Recent Concern: Social Connections - Moderately Isolated (11/20/2023)  Stress: No Stress Concern Present (11/20/2023)  Tobacco Use: Medium Risk (01/13/2024)  Health Literacy: Adequate Health Literacy (11/20/2023)    Readmission Risk Interventions    01/14/2024   10:05 AM 12/17/2023   10:19 AM 12/16/2023   12:34 PM  Readmission Risk Prevention Plan  Transportation Screening Complete Complete Complete  Home Care Screening  Complete Complete  Medication Review (RN CM)  Complete Complete  HRI or Home Care Consult Complete    Social Work Consult for  Recovery Care Planning/Counseling Complete    Palliative Care Screening Not Applicable    Medication Review Oceanographer) Complete

## 2024-01-17 NOTE — Plan of Care (Signed)

## 2024-01-18 DIAGNOSIS — J9601 Acute respiratory failure with hypoxia: Secondary | ICD-10-CM | POA: Diagnosis not present

## 2024-01-18 LAB — BASIC METABOLIC PANEL WITH GFR
Anion gap: 10 (ref 5–15)
BUN: 29 mg/dL — ABNORMAL HIGH (ref 8–23)
CO2: 32 mmol/L (ref 22–32)
Calcium: 9.1 mg/dL (ref 8.9–10.3)
Chloride: 91 mmol/L — ABNORMAL LOW (ref 98–111)
Creatinine, Ser: 1.45 mg/dL — ABNORMAL HIGH (ref 0.61–1.24)
GFR, Estimated: 48 mL/min — ABNORMAL LOW (ref >60)
Glucose, Bld: 227 mg/dL — ABNORMAL HIGH (ref 70–99)
Potassium: 3.5 mmol/L (ref 3.5–5.1)
Sodium: 133 mmol/L — ABNORMAL LOW (ref 135–145)

## 2024-01-18 LAB — GLUCOSE, CAPILLARY
Glucose-Capillary: 247 mg/dL — ABNORMAL HIGH (ref 70–99)
Glucose-Capillary: 288 mg/dL — ABNORMAL HIGH (ref 70–99)
Glucose-Capillary: 176 mg/dL — ABNORMAL HIGH (ref 70–99)
Glucose-Capillary: 288 mg/dL — ABNORMAL HIGH (ref 70–99)

## 2024-01-18 LAB — CBC
HCT: 27.6 % — ABNORMAL LOW (ref 39.0–52.0)
Hemoglobin: 8.3 g/dL — ABNORMAL LOW (ref 13.0–17.0)
MCH: 28.9 pg (ref 26.0–34.0)
MCHC: 30.1 g/dL (ref 30.0–36.0)
MCV: 96.2 fL (ref 80.0–100.0)
Platelets: 296 10*3/uL (ref 150–400)
RBC: 2.87 MIL/uL — ABNORMAL LOW (ref 4.22–5.81)
RDW: 18.7 % — ABNORMAL HIGH (ref 11.5–15.5)
WBC: 9.1 10*3/uL (ref 4.0–10.5)
nRBC: 0 % (ref 0.0–0.2)

## 2024-01-18 LAB — MAGNESIUM: Magnesium: 1.8 mg/dL (ref 1.7–2.4)

## 2024-01-18 MED ORDER — BISACODYL 10 MG RE SUPP
10.0000 mg | Freq: Once | RECTAL | Status: AC
  Administered 2024-01-18: 10 mg via RECTAL
  Filled 2024-01-18: qty 1

## 2024-01-18 MED ORDER — METOLAZONE 5 MG PO TABS
2.5000 mg | ORAL_TABLET | Freq: Once | ORAL | Status: AC
Start: 2024-01-18 — End: 2024-01-18
  Administered 2024-01-18: 2.5 mg via ORAL
  Filled 2024-01-18: qty 1

## 2024-01-18 MED ORDER — ENOXAPARIN SODIUM 60 MG/0.6ML IJ SOSY
60.0000 mg | PREFILLED_SYRINGE | INTRAMUSCULAR | Status: DC
  Administered 2024-01-19 – 2024-01-23 (×5): 60 mg via SUBCUTANEOUS
  Filled 2024-01-18 (×5): qty 0.6

## 2024-01-18 NOTE — Progress Notes (Signed)
 PROGRESS NOTE    Lucas Dudley  NFA:[id number] DOB: [birth year] DOA: 01/13/2024 PCP: Anabel Halon, MD   Brief Narrative:    Lucas Dudley is a 83 y.o. male with medical history significant for systolic and diastolic CHF, prostate cancer, hypertension, paroxysmal atrial fibrillation, OSA, diabetes mellitus. Patient was sent to the ED from heme-onc office with reports of hypoxia-sats down to 84 to 90% on room air.  She reports over the past few days he has had worsening dyspnea with exertion, which improves with rest.  Reports chronic unchanged cough.  Chronic bilateral lower extremity edema, and he denies abdominal bloating.[name]  He is unaware of any weight gain. He reports compliance with his medications include torsemide 40 mg daily.    Recent hospitalized-3/1 to 3/4 for decompensated CHF, hypoxia.  Weight on discharge was 244.2 pounds.   He was recently seen in cardiology clinic 01/02/2024 with complaints of dyspnea on exertion and fatigue.  Per notes- patient with evidence of HFpEF, likely contributing, and RV dysfunction with evidence of potentially severe pulmonary hypertension by echo.  Also possible progression of his coronary artery disease which was documented 2016, he was scheduled for cardiac cath tomorrow.  Assessment & Plan:   Principal Problem:   Acute hypoxic respiratory failure (HCC) Active Problems:   Acute on chronic combined systolic and diastolic CHF (congestive heart failure) (HCC) - dry Weight 109 kg/240.3 lbs. dry weight BNP of 111   Essential hypertension   CAD in native artery   Paroxysmal atrial fibrillation (HCC)   Type 2 diabetes mellitus with other specified complication (HCC)  Assessment and Plan:  Acute hypoxic respiratory failure (HCC) -O2 sats ranging from 84 to 90% on room air.   -Patient reporting short winded sensation with activity and is requiring 2 L supplementation's to maintain saturation above 90%.  -X-rays at time of admission demonstrating  concern for bronchitic changes/atelectasis -Initially IV antibiotics has been started for presumed pneumonia -Continue treatment with diuresis with adjustment as noted below -Follow daily weights, strict I's and O's and low-sodium diet -Wean off oxygen supplementation as tolerated -Patient instructed to use flutter valve. -Procalcitonin low, discontinue doxycycline   Acute on chronic combined systolic and diastolic CHF (congestive heart failure) (HCC) - dry Weight 109 kg/240.3 lbs. dry weight BNP of 111 -Dyspnea on exertion, hypoxia, chronic bilateral lower extremity swelling.  Recent discharge with 244.2 Lbs, today he is weighing 244 pounds -As mentioned above continue IV diuresis -Reds clip measurement has been requested -Low-sodium diet discussed with patient. -Appreciate cardiology evaluation with adjustment of Lasix to 80 mg IV twice daily, but still has an adequate urine output.  Will plan to give 1 dose of metolazone on 4/5   CAD in native artery Denies chest pain at this time.  Troponin 31 > 34.  EKG unchanged.   - Recent Cardiology notes for DOE- 01/02/2024- patient with evidence of HFpEF, likely contributing, and RV dysfunction with evidence of potentially severe pulmonary hypertension by echo.  Also possible progression of his coronary artery disease which was documented 2016, he was scheduled for cardiac cath tomorrow- 3/31. - EDP talked with cardiology on-call, admit here, cardiac cath will be re-scheduled. -Patient denies chest pain -Continue telemetry monitoring -Continue treatment with aspirin.   Essential hypertension-stable -Holding hydralazine in order to provide room for diuresis -Blood pressure currently controlled with Lasix twice a day -Heart healthy/low-sodium diet discussed with patient -Continue to follow vital signs fluctuation.  Iron deficiency anemia -Supplement IV iron and B12 -  Monitor hemoglobin trend -Thought to have occult bleeding and follows  hematology   Type 2 diabetes mellitus with other specified complication (HCC) A1c 8.3. -Continue sliding scale insulin and Semglee -Holding oral hypoglycemic agent -Follow CBG fluctuation.   Paroxysmal atrial fibrillation (HCC) -Currently in sinus rhythm.  Follows with cardiology. -Status post watchman's device.  He is not on anticoagulation -Continue telemetry monitoring -Continue to follow ultralights and replete as needed; goal is for potassium above 4 and magnesium above 2   Class II obesity -Body mass index is 35.43 kg/m. -Low-calorie diet, portion control and increase physical activity discussed with patient.    DVT prophylaxis: Lovenox Code Status: Full Family Communication: Daughter on phone 4/2 Disposition Plan:  Status is: Inpatient Remains inpatient appropriate because: Need for IV medications.   Consultants:  Cardiology  Procedures:  None  Antimicrobials:  Anti-infectives (From admission, onward)    Start     Dose/Rate Route Frequency Ordered Stop   01/14/24 2200  doxycycline (VIBRA-TABS) tablet 100 mg  Status:  Discontinued        100 mg Oral Every 12 hours 01/14/24 1702 01/16/24 0937   01/13/24 2200  cefTRIAXone (ROCEPHIN) 2 g in sodium chloride 0.9 % 100 mL IVPB  Status:  Discontinued        2 g 200 mL/hr over 30 Minutes Intravenous Every 24 hours 01/13/24 2006 01/14/24 1702   01/13/24 2200  doxycycline (VIBRAMYCIN) 100 mg in sodium chloride 0.9 % 250 mL IVPB  Status:  Discontinued        100 mg 125 mL/hr over 120 Minutes Intravenous Every 12 hours 01/13/24 2006 01/14/24 1702       Subjective: Patient seen and evaluated today with no new acute complaints or concerns. No acute concerns or events noted overnight.  Continues to have some shortness of breath, but is improving.  He states he did not have much urine output with increased dose of Lasix yesterday.  Objective: Vitals:   01/17/24 0500 01/17/24 1417 01/17/24 2030 01/18/24 0500  BP: (!)  149/73 (!) 132/57 (!) 155/60   Pulse: 61 61 64   Resp: 20 17 20    Temp: 98.9 F (37.2 C) 98.7 F (37.1 C) 98.1 F (36.7 C)   TempSrc: Oral Oral Oral   SpO2: 97% 93% 97%   Weight: 112.2 kg   112 kg  Height:        Intake/Output Summary (Last 24 hours) at 01/18/2024 1134 Last data filed at 01/18/2024 0300 Gross per 24 hour  Intake 250 ml  Output 1000 ml  Net -750 ml   Filed Weights   01/16/24 0500 01/17/24 0500 01/18/24 0500  Weight: 110.8 kg 112.2 kg 112 kg    Examination:  General exam: Appears calm and comfortable  Respiratory system: Clear to auscultation. Respiratory effort normal.  2 L nasal cannula Cardiovascular system: S1 & S2 heard, RRR.  Gastrointestinal system: Abdomen is soft Central nervous system: Alert and awake Extremities: 1+ bilateral ankle edema Skin: No significant lesions noted Psychiatry: Flat affect.    Data Reviewed: I have personally reviewed following labs and imaging studies  CBC: Recent Labs  Lab 01/13/24 1609 01/15/24 0435 01/16/24 0500 01/17/24 0503 01/18/24 0527  WBC 8.7 7.9 8.7 9.9 9.1  NEUTROABS 7.1  --   --   --   --   HGB 8.8* 8.9* 8.5* 8.7* 8.3*  HCT 28.6* 29.6* 27.4* 28.2* 27.6*  MCV 97.6 99.3 95.8 95.9 96.2  PLT 235 254 252 278 296  Basic Metabolic Panel: Recent Labs  Lab 01/13/24 1609 01/15/24 0435 01/16/24 0500 01/17/24 0503 01/18/24 0527  NA 135 136 135 132* 133*  K 3.7 3.3* 4.2 3.8 3.5  CL 94* 92* 94* 90* 91*  CO2 28 32 26 32 32  GLUCOSE 235* 160* 167* 292* 227*  BUN 32* 24* 20 23 29*  CREATININE 1.59* 1.19 1.27* 1.50* 1.45*  CALCIUM 8.7* 9.0 8.4* 9.0 9.1  MG  --  1.7 1.7 1.9 1.8   GFR: Estimated Creatinine Clearance: 50.1 mL/min (A) (by C-G formula based on SCr of 1.45 mg/dL (H)). Liver Function Tests: Recent Labs  Lab 01/13/24 1609  AST 28  ALT 20  ALKPHOS 52  BILITOT 0.7  PROT 7.5  ALBUMIN 3.6   No results for input(s): "LIPASE", "AMYLASE" in the last 168 hours. No results for input(s):  "AMMONIA" in the last 168 hours. Coagulation Profile: No results for input(s): "INR", "PROTIME" in the last 168 hours. Cardiac Enzymes: No results for input(s): "CKTOTAL", "CKMB", "CKMBINDEX", "TROPONINI" in the last 168 hours. BNP (last 3 results) No results for input(s): "PROBNP" in the last 8760 hours. HbA1C: No results for input(s): "HGBA1C" in the last 72 hours. CBG: Recent Labs  Lab 01/17/24 0728 01/17/24 1115 01/17/24 1632 01/17/24 2026 01/18/24 0725  GLUCAP 226* 287* 252* 320* 247*   Lipid Profile: No results for input(s): "CHOL", "HDL", "LDLCALC", "TRIG", "CHOLHDL", "LDLDIRECT" in the last 72 hours. Thyroid Function Tests: No results for input(s): "TSH", "T4TOTAL", "FREET4", "T3FREE", "THYROIDAB" in the last 72 hours. Anemia Panel: No results for input(s): "VITAMINB12", "FOLATE", "FERRITIN", "TIBC", "IRON", "RETICCTPCT" in the last 72 hours. Sepsis Labs: Recent Labs  Lab 01/15/24 0435  PROCALCITON <0.10    No results found for this or any previous visit (from the past 240 hours).       Radiology Studies: No results found.   Scheduled Meds:  amiodarone  200 mg Oral QHS   aspirin  81 mg Oral Daily   dapagliflozin propanediol  10 mg Oral Daily   docusate sodium  100 mg Oral BID   enoxaparin (LOVENOX) injection  55 mg Subcutaneous Q24H   furosemide  80 mg Intravenous BID   insulin aspart  0-15 Units Subcutaneous TID WC   insulin aspart  0-5 Units Subcutaneous QHS   insulin glargine-yfgn  25 Units Subcutaneous QHS   metolazone  2.5 mg Oral Once   pantoprazole  40 mg Oral Daily   polyethylene glycol  17 g Oral Daily     LOS: 5 days    Time spent: 55 minutes    Lucas Buscemi Hoover Brunette, DO [facility]  If 7PM-7AM, please contact night-coverage [email or web address] 01/18/2024, 11:34 AM

## 2024-01-19 DIAGNOSIS — J9601 Acute respiratory failure with hypoxia: Secondary | ICD-10-CM | POA: Diagnosis not present

## 2024-01-19 LAB — GLUCOSE, CAPILLARY
Glucose-Capillary: 231 mg/dL — ABNORMAL HIGH (ref 70–99)
Glucose-Capillary: 251 mg/dL — ABNORMAL HIGH (ref 70–99)
Glucose-Capillary: 270 mg/dL — ABNORMAL HIGH (ref 70–99)
Glucose-Capillary: 281 mg/dL — ABNORMAL HIGH (ref 70–99)

## 2024-01-19 LAB — BASIC METABOLIC PANEL WITH GFR
Anion gap: 12 (ref 5–15)
BUN: 30 mg/dL — ABNORMAL HIGH (ref 8–23)
CO2: 35 mmol/L — ABNORMAL HIGH (ref 22–32)
Calcium: 9.3 mg/dL (ref 8.9–10.3)
Chloride: 90 mmol/L — ABNORMAL LOW (ref 98–111)
Creatinine, Ser: 1.6 mg/dL — ABNORMAL HIGH (ref 0.61–1.24)
GFR, Estimated: 43 mL/min — ABNORMAL LOW (ref >60)
Glucose, Bld: 252 mg/dL — ABNORMAL HIGH (ref 70–99)
Potassium: 3.4 mmol/L — ABNORMAL LOW (ref 3.5–5.1)
Sodium: 137 mmol/L (ref 135–145)

## 2024-01-19 LAB — CBC
HCT: 26.3 % — ABNORMAL LOW (ref 39.0–52.0)
Hemoglobin: 8 g/dL — ABNORMAL LOW (ref 13.0–17.0)
MCH: 29.4 pg (ref 26.0–34.0)
MCHC: 30.4 g/dL (ref 30.0–36.0)
MCV: 96.7 fL (ref 80.0–100.0)
Platelets: 336 10*3/uL (ref 150–400)
RBC: 2.72 MIL/uL — ABNORMAL LOW (ref 4.22–5.81)
RDW: 19 % — ABNORMAL HIGH (ref 11.5–15.5)
WBC: 8.3 10*3/uL (ref 4.0–10.5)
nRBC: 0 % (ref 0.0–0.2)

## 2024-01-19 LAB — MAGNESIUM: Magnesium: 1.8 mg/dL (ref 1.7–2.4)

## 2024-01-19 MED ORDER — METOLAZONE 5 MG PO TABS
2.5000 mg | ORAL_TABLET | Freq: Once | ORAL | Status: AC
  Administered 2024-01-19: 2.5 mg via ORAL
  Filled 2024-01-19: qty 1

## 2024-01-19 MED ORDER — TORSEMIDE 20 MG PO TABS
40.0000 mg | ORAL_TABLET | Freq: Two times a day (BID) | ORAL | Status: DC
  Administered 2024-01-19: 40 mg via ORAL
  Filled 2024-01-19: qty 2

## 2024-01-19 MED ORDER — POTASSIUM CHLORIDE CRYS ER 20 MEQ PO TBCR
40.0000 meq | EXTENDED_RELEASE_TABLET | Freq: Once | ORAL | Status: AC
  Administered 2024-01-19: 40 meq via ORAL
  Filled 2024-01-19: qty 2

## 2024-01-19 MED ORDER — FUROSEMIDE 10 MG/ML IJ SOLN
80.0000 mg | Freq: Two times a day (BID) | INTRAMUSCULAR | Status: DC
  Administered 2024-01-19 – 2024-01-22 (×6): 80 mg via INTRAVENOUS
  Filled 2024-01-19 (×6): qty 8

## 2024-01-19 NOTE — Plan of Care (Signed)
  Problem: Education: Goal: Knowledge of General Education information will improve Description: Including pain rating scale, medication(s)/side effects and non-pharmacologic comfort measures Outcome: Progressing   Problem: Health Behavior/Discharge Planning: Goal: Ability to manage health-related needs will improve Outcome: Progressing   Problem: Clinical Measurements: Goal: Ability to maintain clinical measurements within normal limits will improve Outcome: Progressing Goal: Will remain free from infection Outcome: Progressing Goal: Diagnostic test results will improve Outcome: Progressing Goal: Respiratory complications will improve Outcome: Progressing Goal: Cardiovascular complication will be avoided Outcome: Progressing   Problem: Activity: Goal: Risk for activity intolerance will decrease Outcome: Progressing   Problem: Nutrition: Goal: Adequate nutrition will be maintained Outcome: Progressing   Problem: Coping: Goal: Level of anxiety will decrease Outcome: Progressing   Problem: Elimination: Goal: Will not experience complications related to bowel motility Outcome: Progressing Goal: Will not experience complications related to urinary retention Outcome: Progressing   Problem: Pain Managment: Goal: General experience of comfort will improve and/or be controlled Outcome: Progressing   Problem: Safety: Goal: Ability to remain free from injury will improve Outcome: Progressing   Problem: Skin Integrity: Goal: Risk for impaired skin integrity will decrease Outcome: Progressing   Problem: Education: Goal: Ability to describe self-care measures that may prevent or decrease complications (Diabetes Survival Skills Education) will improve Outcome: Progressing Goal: Individualized Educational Video(s) Outcome: Progressing   Problem: Coping: Goal: Ability to adjust to condition or change in health will improve Outcome: Progressing   Problem: Fluid  Volume: Goal: Ability to maintain a balanced intake and output will improve Outcome: Progressing   Problem: Health Behavior/Discharge Planning: Goal: Ability to identify and utilize available resources and services will improve Outcome: Progressing Goal: Ability to manage health-related needs will improve Outcome: Progressing   Problem: Metabolic: Goal: Ability to maintain appropriate glucose levels will improve Outcome: Progressing   Problem: Nutritional: Goal: Maintenance of adequate nutrition will improve Outcome: Progressing Goal: Progress toward achieving an optimal weight will improve Outcome: Progressing   Problem: Skin Integrity: Goal: Risk for impaired skin integrity will decrease Outcome: Progressing   Problem: Tissue Perfusion: Goal: Adequacy of tissue perfusion will improve Outcome: Progressing   Problem: Education: Goal: Understanding of CV disease, CV risk reduction, and recovery process will improve Outcome: Progressing Goal: Individualized Educational Video(s) Outcome: Progressing   Problem: Activity: Goal: Ability to return to baseline activity level will improve Outcome: Progressing   Problem: Cardiovascular: Goal: Ability to achieve and maintain adequate cardiovascular perfusion will improve Outcome: Progressing Goal: Vascular access site(s) Level 0-1 will be maintained Outcome: Progressing   Problem: Health Behavior/Discharge Planning: Goal: Ability to safely manage health-related needs after discharge will improve Outcome: Progressing   Problem: Education: Goal: Ability to demonstrate management of disease process will improve Outcome: Progressing Goal: Ability to verbalize understanding of medication therapies will improve Outcome: Progressing Goal: Individualized Educational Video(s) Outcome: Progressing   Problem: Activity: Goal: Capacity to carry out activities will improve Outcome: Progressing   Problem: Cardiac: Goal: Ability to  achieve and maintain adequate cardiopulmonary perfusion will improve Outcome: Progressing

## 2024-01-19 NOTE — Progress Notes (Signed)
 PROGRESS NOTE    Lucas Dudley  KVQ:[id number] DOB: 07/10/1941 DOA: 01/13/2024 PCP: Anabel Halon, MD   Brief Narrative:    Lucas Dudley is a 83 y.o. male with medical history significant for systolic and diastolic CHF, prostate cancer, hypertension, paroxysmal atrial fibrillation, OSA, diabetes mellitus. Patient was sent to the ED from heme-onc office with reports of hypoxia-sats down to 84 to 90% on room air.  She reports over the past few days he has had worsening dyspnea with exertion, which improves with rest.  Reports chronic unchanged cough.  Chronic bilateral lower extremity edema, and he denies abdominal bloating.[name]  He is unaware of any weight gain. He reports compliance with his medications include torsemide 40 mg daily.    Recent hospitalized-3/1 to 3/4 for decompensated CHF, hypoxia.  Weight on discharge was 244.2 pounds.   He was recently seen in cardiology clinic 01/02/2024 with complaints of dyspnea on exertion and fatigue.  Per notes- patient with evidence of HFpEF, likely contributing, and RV dysfunction with evidence of potentially severe pulmonary hypertension by echo.  Also possible progression of his coronary artery disease which was documented 2016, he was scheduled for cardiac cath tomorrow.  Assessment & Plan:   Principal Problem:   Acute hypoxic respiratory failure (HCC) Active Problems:   Acute on chronic combined systolic and diastolic CHF (congestive heart failure) (HCC) - dry Weight 109 kg/240.3 lbs. dry weight BNP of 111   Essential hypertension   CAD in native artery   Paroxysmal atrial fibrillation (HCC)   Type 2 diabetes mellitus with other specified complication (HCC)  Assessment and Plan:  Acute hypoxic respiratory failure (HCC) -O2 sats ranging from 84 to 90% on room air.   -Patient reporting short winded sensation with activity and is requiring 2 L supplementation's to maintain saturation above 90%.  -X-rays at time of admission demonstrating  concern for bronchitic changes/atelectasis -Initially IV antibiotics has been started for presumed pneumonia -Continue treatment with diuresis with adjustment as noted below -Follow daily weights, strict I's and O's and low-sodium diet -Wean off oxygen supplementation as tolerated -Patient instructed to use flutter valve. -Procalcitonin low, discontinue doxycycline   Acute on chronic combined systolic and diastolic CHF (congestive heart failure) (HCC) - dry Weight 109 kg/240.3 lbs. dry weight BNP of 111 -Dyspnea on exertion, hypoxia, chronic bilateral lower extremity swelling.  Recent discharge with 244.2 Lbs, today he is weighing 244 pounds -As mentioned above continue IV diuresis -Reds clip measurement has been requested -Low-sodium diet discussed with patient. -Appreciate cardiology evaluation with adjustment of Lasix to 80 mg IV twice daily, but still has an adequate urine output.  Tried to switch to torsemide and attempt discharge on 4/6, but patient is still having significant struggle with hypoxemia with ambulation and has shortness of breath too.  Creatinine remains somewhat stable.  Change back to IV Lasix and repeat dose of metolazone.  Appreciate further cardiology input in AM.   CAD in native artery Denies chest pain at this time.  Troponin 31 > 34.  EKG unchanged.   - Recent Cardiology notes for DOE- 01/02/2024- patient with evidence of HFpEF, likely contributing, and RV dysfunction with evidence of potentially severe pulmonary hypertension by echo.  Also possible progression of his coronary artery disease which was documented 2016, he was scheduled for cardiac cath tomorrow- 3/31. - EDP talked with cardiology on-call, admit here, cardiac cath will be re-scheduled. -Patient denies chest pain -Continue telemetry monitoring -Continue treatment with aspirin.   Essential  hypertension-stable -Holding hydralazine in order to provide room for diuresis -Blood pressure currently  controlled with Lasix twice a day -Heart healthy/low-sodium diet discussed with patient -Continue to follow vital signs fluctuation.  Iron deficiency anemia -Supplement IV iron and B12 -Monitor hemoglobin trend -Thought to have occult bleeding and follows hematology   Type 2 diabetes mellitus with other specified complication (HCC) A1c 8.3. -Continue sliding scale insulin and Semglee -Holding oral hypoglycemic agent -Follow CBG fluctuation.   Paroxysmal atrial fibrillation (HCC) -Currently in sinus rhythm.  Follows with cardiology. -Status post watchman's device.  He is not on anticoagulation -Continue telemetry monitoring -Continue to follow ultralights and replete as needed; goal is for potassium above 4 and magnesium above 2   Class II obesity -Body mass index is 35.43 kg/m. -Low-calorie diet, portion control and increase physical activity discussed with patient.    DVT prophylaxis: Lovenox Code Status: Full Family Communication: Daughter on phone 4/2 Disposition Plan:  Status is: Inpatient Remains inpatient appropriate because: Need for IV medications.   Consultants:  Cardiology  Procedures:  None  Antimicrobials:  Anti-infectives (From admission, onward)    Start     Dose/Rate Route Frequency Ordered Stop   01/14/24 2200  doxycycline (VIBRA-TABS) tablet 100 mg  Status:  Discontinued        100 mg Oral Every 12 hours 01/14/24 1702 01/16/24 0937   01/13/24 2200  cefTRIAXone (ROCEPHIN) 2 g in sodium chloride 0.9 % 100 mL IVPB  Status:  Discontinued        2 g 200 mL/hr over 30 Minutes Intravenous Every 24 hours 01/13/24 2006 01/14/24 1702   01/13/24 2200  doxycycline (VIBRAMYCIN) 100 mg in sodium chloride 0.9 % 250 mL IVPB  Status:  Discontinued        100 mg 125 mL/hr over 120 Minutes Intravenous Every 12 hours 01/13/24 2006 01/14/24 1702       Subjective: Patient seen and evaluated today with no new acute complaints or concerns. No acute concerns or  events noted overnight.  He continues to require oxygen and had difficulty with maintaining adequate pulse ox with ambulation.  Continues to have shortness of breath with exertion as well.  Noted to have robust urine output with use of metolazone yesterday.  Objective: Vitals:   01/18/24 2036 01/18/24 2307 01/19/24 0303 01/19/24 0431  BP: (!) 139/58   (!) 134/55  Pulse: 67 67  61  Resp: 18 18  20   Temp: 99.6 F (37.6 C)   98.5 F (36.9 C)  TempSrc: Oral   Oral  SpO2: 93% 94%  96%  Weight:   111.8 kg   Height:        Intake/Output Summary (Last 24 hours) at 01/19/2024 1041 Last data filed at 01/19/2024 0900 Gross per 24 hour  Intake 1200 ml  Output 3000 ml  Net -1800 ml   Filed Weights   01/17/24 0500 01/18/24 0500 01/19/24 0303  Weight: 112.2 kg 112 kg 111.8 kg    Examination:  General exam: Appears calm and comfortable  Respiratory system: Clear to auscultation. Respiratory effort normal.  2 L nasal cannula Cardiovascular system: S1 & S2 heard, RRR.  Gastrointestinal system: Abdomen is soft Central nervous system: Alert and awake Extremities: 2+ bilateral ankle edema Skin: No significant lesions noted Psychiatry: Flat affect.    Data Reviewed: I have personally reviewed following labs and imaging studies  CBC: Recent Labs  Lab 01/13/24 1609 01/15/24 0435 01/16/24 0500 01/17/24 0503 01/18/24 0527 01/19/24 0414  WBC  8.7 7.9 8.7 9.9 9.1 8.3  NEUTROABS 7.1  --   --   --   --   --   HGB 8.8* 8.9* 8.5* 8.7* 8.3* 8.0*  HCT 28.6* 29.6* 27.4* 28.2* 27.6* 26.3*  MCV 97.6 99.3 95.8 95.9 96.2 96.7  PLT 235 254 252 278 296 336   Basic Metabolic Panel: Recent Labs  Lab 01/15/24 0435 01/16/24 0500 01/17/24 0503 01/18/24 0527 01/19/24 0414  NA 136 135 132* 133* 137  K 3.3* 4.2 3.8 3.5 3.4*  CL 92* 94* 90* 91* 90*  CO2 32 26 32 32 35*  GLUCOSE 160* 167* 292* 227* 252*  BUN 24* 20 23 29* 30*  CREATININE 1.19 1.27* 1.50* 1.45* 1.60*  CALCIUM 9.0 8.4* 9.0 9.1 9.3   MG 1.7 1.7 1.9 1.8 1.8   GFR: Estimated Creatinine Clearance: 45.3 mL/min (A) (by C-G formula based on SCr of 1.6 mg/dL (H)). Liver Function Tests: Recent Labs  Lab 01/13/24 1609  AST 28  ALT 20  ALKPHOS 52  BILITOT 0.7  PROT 7.5  ALBUMIN 3.6   No results for input(s): "LIPASE", "AMYLASE" in the last 168 hours. No results for input(s): "AMMONIA" in the last 168 hours. Coagulation Profile: No results for input(s): "INR", "PROTIME" in the last 168 hours. Cardiac Enzymes: No results for input(s): "CKTOTAL", "CKMB", "CKMBINDEX", "TROPONINI" in the last 168 hours. BNP (last 3 results) No results for input(s): "PROBNP" in the last 8760 hours. HbA1C: No results for input(s): "HGBA1C" in the last 72 hours. CBG: Recent Labs  Lab 01/18/24 0725 01/18/24 1159 01/18/24 1658 01/18/24 2034 01/19/24 0726  GLUCAP 247* 288* 176* 288* 231*   Lipid Profile: No results for input(s): "CHOL", "HDL", "LDLCALC", "TRIG", "CHOLHDL", "LDLDIRECT" in the last 72 hours. Thyroid Function Tests: No results for input(s): "TSH", "T4TOTAL", "FREET4", "T3FREE", "THYROIDAB" in the last 72 hours. Anemia Panel: No results for input(s): "VITAMINB12", "FOLATE", "FERRITIN", "TIBC", "IRON", "RETICCTPCT" in the last 72 hours. Sepsis Labs: Recent Labs  Lab 01/15/24 0435  PROCALCITON <0.10    No results found for this or any previous visit (from the past 240 hours).       Radiology Studies: No results found.   Scheduled Meds:  amiodarone  200 mg Oral QHS   aspirin  81 mg Oral Daily   dapagliflozin propanediol  10 mg Oral Daily   docusate sodium  100 mg Oral BID   enoxaparin (LOVENOX) injection  60 mg Subcutaneous Q24H   furosemide  80 mg Intravenous Q12H   insulin aspart  0-15 Units Subcutaneous TID WC   insulin aspart  0-5 Units Subcutaneous QHS   insulin glargine-yfgn  25 Units Subcutaneous QHS   metolazone  2.5 mg Oral Once   pantoprazole  40 mg Oral Daily   polyethylene glycol  17 g Oral  Daily     LOS: 6 days    Time spent: 55 minutes    Lanaya Bennis Hoover Brunette, DO [facility]  If 7PM-7AM, please contact night-coverage [email or web address] 01/19/2024, 10:41 AM

## 2024-01-19 NOTE — Plan of Care (Signed)
  Problem: Education: Goal: Knowledge of General Education information will improve Description: Including pain rating scale, medication(s)/side effects and non-pharmacologic comfort measures Outcome: Progressing   Problem: Health Behavior/Discharge Planning: Goal: Ability to manage health-related needs will improve Outcome: Progressing   Problem: Clinical Measurements: Goal: Ability to maintain clinical measurements within normal limits will improve Outcome: Progressing   Problem: Activity: Goal: Risk for activity intolerance will decrease Outcome: Progressing   Problem: Nutrition: Goal: Adequate nutrition will be maintained Outcome: Progressing   Problem: Coping: Goal: Level of anxiety will decrease Outcome: Progressing   Problem: Elimination: Goal: Will not experience complications related to bowel motility Outcome: Progressing   Problem: Education: Goal: Ability to describe self-care measures that may prevent or decrease complications (Diabetes Survival Skills Education) will improve Outcome: Progressing   Problem: Coping: Goal: Ability to adjust to condition or change in health will improve Outcome: Progressing   Problem: Fluid Volume: Goal: Ability to maintain a balanced intake and output will improve Outcome: Progressing   Problem: Health Behavior/Discharge Planning: Goal: Ability to identify and utilize available resources and services will improve Outcome: Progressing   Problem: Metabolic: Goal: Ability to maintain appropriate glucose levels will improve Outcome: Progressing   Problem: Skin Integrity: Goal: Risk for impaired skin integrity will decrease Outcome: Progressing

## 2024-01-19 NOTE — Progress Notes (Addendum)
 SATURATION QUALIFICATIONS: (This note is used to comply with regulatory documentation for home oxygen)  Patient Saturations on Room Air at Rest = 86%  Patient Saturations on Room Air while Ambulating = n/a as patient could not tolerate  Patient Saturations on 2 Liters of oxygen while Ambulating = 84%  Patient o2 was increased to 4l/[place]  for compensation but patient only came up to 87%. He recovered once sitting fairly quickly to 92%. Now 94% back on the 2l/[place] in his room.

## 2024-01-20 ENCOUNTER — Inpatient Hospital Stay (HOSPITAL_COMMUNITY)

## 2024-01-20 ENCOUNTER — Inpatient Hospital Stay

## 2024-01-20 DIAGNOSIS — J9601 Acute respiratory failure with hypoxia: Secondary | ICD-10-CM | POA: Diagnosis not present

## 2024-01-20 LAB — GLUCOSE, CAPILLARY
Glucose-Capillary: 197 mg/dL — ABNORMAL HIGH (ref 70–99)
Glucose-Capillary: 261 mg/dL — ABNORMAL HIGH (ref 70–99)
Glucose-Capillary: 234 mg/dL — ABNORMAL HIGH (ref 70–99)
Glucose-Capillary: 285 mg/dL — ABNORMAL HIGH (ref 70–99)

## 2024-01-20 LAB — HEMOGLOBIN AND HEMATOCRIT, BLOOD
HCT: 28.3 % — ABNORMAL LOW (ref 39.0–52.0)
Hemoglobin: 8.8 g/dL — ABNORMAL LOW (ref 13.0–17.0)

## 2024-01-20 LAB — BASIC METABOLIC PANEL WITH GFR
Anion gap: 15 (ref 5–15)
BUN: 32 mg/dL — ABNORMAL HIGH (ref 8–23)
CO2: 37 mmol/L — ABNORMAL HIGH (ref 22–32)
Calcium: 9.6 mg/dL (ref 8.9–10.3)
Chloride: 85 mmol/L — ABNORMAL LOW (ref 98–111)
Creatinine, Ser: 1.62 mg/dL — ABNORMAL HIGH (ref 0.61–1.24)
GFR, Estimated: 42 mL/min — ABNORMAL LOW (ref >60)
Glucose, Bld: 193 mg/dL — ABNORMAL HIGH (ref 70–99)
Potassium: 2.8 mmol/L — ABNORMAL LOW (ref 3.5–5.1)
Sodium: 137 mmol/L (ref 135–145)

## 2024-01-20 LAB — MAGNESIUM: Magnesium: 1.8 mg/dL (ref 1.7–2.4)

## 2024-01-20 MED ORDER — OCTREOTIDE ACETATE 20 MG IM KIT: 20.0000 mg | PACK | Freq: Once | INTRAMUSCULAR | Status: DC

## 2024-01-20 MED ORDER — POTASSIUM CHLORIDE CRYS ER 20 MEQ PO TBCR
40.0000 meq | EXTENDED_RELEASE_TABLET | Freq: Two times a day (BID) | ORAL | Status: DC
  Administered 2024-01-20 – 2024-01-24 (×9): 40 meq via ORAL
  Filled 2024-01-20 (×3): qty 2
  Filled 2024-01-20: qty 4
  Filled 2024-01-20 (×5): qty 2

## 2024-01-20 MED ORDER — METOLAZONE 5 MG PO TABS
2.5000 mg | ORAL_TABLET | Freq: Once | ORAL | Status: AC
  Administered 2024-01-20: 2.5 mg via ORAL
  Filled 2024-01-20: qty 1

## 2024-01-20 MED ORDER — POTASSIUM CHLORIDE 10 MEQ/100ML IV SOLN
10.0000 meq | INTRAVENOUS | Status: AC
  Administered 2024-01-20 (×4): 10 meq via INTRAVENOUS
  Filled 2024-01-20 (×4): qty 100

## 2024-01-20 MED ORDER — INSULIN ASPART 100 UNIT/ML IJ SOLN
3.0000 [IU] | Freq: Three times a day (TID) | INTRAMUSCULAR | Status: DC
  Administered 2024-01-20 – 2024-01-22 (×6): 3 [IU] via SUBCUTANEOUS

## 2024-01-20 MED ORDER — INSULIN GLARGINE-YFGN 100 UNIT/ML ~~LOC~~ SOLN
27.0000 [IU] | Freq: Every day | SUBCUTANEOUS | Status: DC
  Administered 2024-01-20 – 2024-01-21 (×2): 27 [IU] via SUBCUTANEOUS
  Filled 2024-01-20 (×3): qty 0.27
  Filled 2024-01-20: qty 10

## 2024-01-20 NOTE — Plan of Care (Signed)
  Problem: Education: Goal: Knowledge of General Education information will improve Description: Including pain rating scale, medication(s)/side effects and non-pharmacologic comfort measures Outcome: Progressing   Problem: Health Behavior/Discharge Planning: Goal: Ability to manage health-related needs will improve Outcome: Progressing   Problem: Clinical Measurements: Goal: Ability to maintain clinical measurements within normal limits will improve Outcome: Progressing Goal: Will remain free from infection Outcome: Progressing Goal: Diagnostic test results will improve Outcome: Progressing Goal: Respiratory complications will improve Outcome: Progressing Goal: Cardiovascular complication will be avoided Outcome: Progressing   Problem: Activity: Goal: Risk for activity intolerance will decrease Outcome: Progressing   Problem: Nutrition: Goal: Adequate nutrition will be maintained Outcome: Progressing   Problem: Coping: Goal: Level of anxiety will decrease Outcome: Progressing   Problem: Elimination: Goal: Will not experience complications related to bowel motility Outcome: Progressing Goal: Will not experience complications related to urinary retention Outcome: Progressing   Problem: Pain Managment: Goal: General experience of comfort will improve and/or be controlled Outcome: Progressing   Problem: Safety: Goal: Ability to remain free from injury will improve Outcome: Progressing   Problem: Skin Integrity: Goal: Risk for impaired skin integrity will decrease Outcome: Progressing   Problem: Education: Goal: Ability to describe self-care measures that may prevent or decrease complications (Diabetes Survival Skills Education) will improve Outcome: Progressing Goal: Individualized Educational Video(s) Outcome: Progressing   Problem: Coping: Goal: Ability to adjust to condition or change in health will improve Outcome: Progressing   Problem: Fluid  Volume: Goal: Ability to maintain a balanced intake and output will improve Outcome: Progressing   Problem: Health Behavior/Discharge Planning: Goal: Ability to identify and utilize available resources and services will improve Outcome: Progressing Goal: Ability to manage health-related needs will improve Outcome: Progressing   Problem: Metabolic: Goal: Ability to maintain appropriate glucose levels will improve Outcome: Progressing   Problem: Nutritional: Goal: Maintenance of adequate nutrition will improve Outcome: Progressing Goal: Progress toward achieving an optimal weight will improve Outcome: Progressing   Problem: Skin Integrity: Goal: Risk for impaired skin integrity will decrease Outcome: Progressing   Problem: Tissue Perfusion: Goal: Adequacy of tissue perfusion will improve Outcome: Progressing   Problem: Education: Goal: Understanding of CV disease, CV risk reduction, and recovery process will improve Outcome: Progressing Goal: Individualized Educational Video(s) Outcome: Progressing   Problem: Activity: Goal: Ability to return to baseline activity level will improve Outcome: Progressing   Problem: Cardiovascular: Goal: Ability to achieve and maintain adequate cardiovascular perfusion will improve Outcome: Progressing Goal: Vascular access site(s) Level 0-1 will be maintained Outcome: Progressing   Problem: Health Behavior/Discharge Planning: Goal: Ability to safely manage health-related needs after discharge will improve Outcome: Progressing   Problem: Education: Goal: Ability to demonstrate management of disease process will improve Outcome: Progressing Goal: Ability to verbalize understanding of medication therapies will improve Outcome: Progressing Goal: Individualized Educational Video(s) Outcome: Progressing   Problem: Activity: Goal: Capacity to carry out activities will improve Outcome: Progressing   Problem: Cardiac: Goal: Ability to  achieve and maintain adequate cardiopulmonary perfusion will improve Outcome: Progressing

## 2024-01-20 NOTE — Progress Notes (Addendum)
 Rounding Note    Patient Name: Lucas Dudley Date of Encounter: 01/20/2024  [facility] Cardiologist: Nona Dell, MD    Subjective   Some ongoing SOB  Inpatient Medications    Scheduled Meds:  amiodarone  200 mg Oral QHS   aspirin  81 mg Oral Daily   dapagliflozin propanediol  10 mg Oral Daily   docusate sodium  100 mg Oral BID   enoxaparin (LOVENOX) injection  60 mg Subcutaneous Q24H   furosemide  80 mg Intravenous Q12H   insulin aspart  0-15 Units Subcutaneous TID WC   insulin aspart  0-5 Units Subcutaneous QHS   insulin glargine-yfgn  25 Units Subcutaneous QHS   pantoprazole  40 mg Oral Daily   polyethylene glycol  17 g Oral Daily   potassium chloride  40 mEq Oral BID   Continuous Infusions:  potassium chloride     PRN Meds: acetaminophen, albuterol, diphenhydrAMINE, naphazoline-glycerin, traZODone   Vital Signs    Vitals:   01/19/24 1308 01/19/24 2018 01/20/24 0341 01/20/24 0500  BP: (!) 124/58 (!) 133/53 (!) 102/42   Pulse: (!) 56 60 61   Resp:  18 19   Temp: 98.1 F (36.7 C) (!) 97.5 F (36.4 C) 98.1 F (36.7 C)   TempSrc: Oral Oral Oral   SpO2: 100% 100% 92%   Weight:    109.5 kg  Height:        Intake/Output Summary (Last 24 hours) at 01/20/2024 0816 Last data filed at 01/20/2024 1610 Gross per 24 hour  Intake 480 ml  Output 3950 ml  Net -3470 ml      01/20/2024    5:00 AM 01/19/2024    3:03 AM 01/18/2024    5:00 AM  Last 3 Weights  Weight (lbs) 241 lb 6.5 oz 246 lb 8 oz 246 lb 14.6 oz  Weight (kg) 109.5 kg 111.812 kg 112 kg      Telemetry    SR - Personally Reviewed  ECG    N/a - Personally Reviewed  Physical Exam   GEN: No acute distress.   Neck: No JVD Cardiac: RRR, no murmurs, rubs, or gallops.  Respiratory:faint crackles bilateral bases GI: Soft, nontender, non-distended  MS: 2+ bilateral LE edema Neuro:  Nonfocal  Psych: Normal affect   Labs    High Sensitivity Troponin:   Recent Labs  Lab 01/13/24 1609  01/13/24 1739  TROPONINIHS 31* 34*     Chemistry Recent Labs  Lab 01/13/24 1609 01/15/24 0435 01/18/24 0527 01/19/24 0414 01/20/24 0450  NA 135   < > 133* 137 137  K 3.7   < > 3.5 3.4* 2.8*  CL 94*   < > 91* 90* 85*  CO2 28   < > 32 35* 37*  GLUCOSE 235*   < > 227* 252* 193*  BUN 32*   < > 29* 30* 32*  CREATININE 1.59*   < > 1.45* 1.60* 1.62*  CALCIUM 8.7*   < > 9.1 9.3 9.6  MG  --    < > 1.8 1.8 1.8  PROT 7.5  --   --   --   --   ALBUMIN 3.6  --   --   --   --   AST 28  --   --   --   --   ALT 20  --   --   --   --   ALKPHOS 52  --   --   --   --  BILITOT 0.7  --   --   --   --   GFRNONAA 43*   < > 48* 43* 42*  ANIONGAP 13   < > 10 12 15    < > = values in this interval not displayed.    Lipids No results for input(s): "CHOL", "TRIG", "HDL", "LABVLDL", "LDLCALC", "CHOLHDL" in the last 168 hours.  Hematology Recent Labs  Lab 01/17/24 0503 01/18/24 0527 01/19/24 0414  WBC 9.9 9.1 8.3  RBC 2.94* 2.87* 2.72*  HGB 8.7* 8.3* 8.0*  HCT 28.2* 27.6* 26.3*  MCV 95.9 96.2 96.7  MCH 29.6 28.9 29.4  MCHC 30.9 30.1 30.4  RDW 18.9* 18.7* 19.0*  PLT 278 296 336   Thyroid No results for input(s): "TSH", "FREET4" in the last 168 hours.  BNP Recent Labs  Lab 01/13/24 2141  BNP 327.0*    DDimer No results for input(s): "DDIMER" in the last 168 hours.   Radiology    No results found.  Cardiac Studies     Patient Profile     Lucas Dudley is a 83 y.o. male with a hx of HFpEF, CAD with occluded diagonal and moderate disease of LAD and PDA, persistent afib with watchman, OSA, DM, iron deficient anemia who is being seen 01/15/2024 for the evaluation of SOB at the request of Dr Sherryll Burger.   Assessment & Plan    1.Acute on chronic HFpEF - 12/2023 echo: LVEF 55-60%, indet diastolic, mild RV dysfunction, severe pulm HTN PASP 63, dilated fixed IVC,  -recent discharge weight 244 lbs, up to 251 on admission. Standing weight today 240 lbs.  - CXR LLL atelectasis vs infiltrate. BNP  327.  - he is on IV lasix 80mg  bid with metolazone 2.5mg  yesterdsy. I/Os incomplete yesterday, roughly neg 3.4 L yesterday and very rough estimate of neg 10.3 L since admission. Mild variations in Cr without clear trend, values have been within prior range. Reds vest today 44% - ongoing hypoxia over weekend, continued on IV diuresis - of note he reports chronic leg edema even when otherwise euvolemic  - remains fluid overloaded. Continue IV lasix 80mg  x 2 and given metolazone 2.5mg  x 1 today.   - off coreg due to bradycardia in the past from notes  - had been on oral toresmide 20mg  in AM and 40mg  in PM, at discharge would plan for 40mg  bid.  -start farxiga 10mg  daily, place case management consult       2.CAD -2016 cath D1 occluded, 50% prox to mid LAD, 40% RPDA - due to progressing DOE Dr Diona Browner had planned for outpatient RHC/LHC that is currnetly on hold - would need to follow his Hgb prior to committing to cath, would also need to be euvolemic with pneumonia resolved. His outpatient study was cancelled, no plans to arrange as inpatient at this time, reassess at outpatient f/u timing   3. Iron deficient anemia - was to have EGD with push enteroscopy.  - from notes history of small bowel AVMs - 12/2023 EGD/enterscopy 2 small nonbleeding angiodysplastic lesions that were interved on - Hgb down further this admit, follow.  - from recent heme note thought occult GI bleeding, B12 deficient. Plan for IV iron, B12 replacement, close monitoring.    4. PAF - has watchman device - low amplitude p wave but from EKG and tele looks to be in SR   5. Pulmonary HTN - 12/2023 echo: LVEF 55-60%, indet diastolic, mild RV dysfunction, severe pulm HTN PASP 63, dilated fixed  IVC,  - plans had been for outpatient RHC - has history of OSA, HFpEF. Unclear if any additional etiologies. 12/2023 CT PE negative for PE - RHC when able as outpatient.    6. Possible pnuemonia - abx per primary team  For  questions or updates, please contact [facility] Please consult [email or web address] for contact info under        Signed, Dina Rich, MD  01/20/2024, 8:16 AM

## 2024-01-20 NOTE — Inpatient Diabetes Management (Signed)
 Inpatient Diabetes Program Recommendations  AACE/ADA: New Consensus Statement on Inpatient Glycemic Control Target Ranges:  Prepandial:   less than 140 mg/dL      Peak postprandial:   less than 180 mg/dL (1-2 hours)      Critically ill patients:  140 - 180 mg/dL    Latest Reference Range & Units 01/19/24 07:26 01/19/24 11:22 01/19/24 16:14 01/19/24 20:15 01/20/24 07:48  Glucose-Capillary 70 - 99 mg/dL 540 (H) 981 (H) 191 (H) 281 (H) 197 (H)    Review of Glycemic Control  Diabetes history: DM2 Outpatient Diabetes medications: Amaryl 2 mg BID, Basaglar 38 units at bedtime, Metformin 500 mg BID  Current orders for Inpatient glycemic control: Semglee 25 units at bedtime, Novolog 0-15 units TID with meals, Novolog 0-5 units at bedtime, Farxiga 10 mg dajily  Inpatient Diabetes Program Recommendations:    Insulin: Please consider increasing Semglee to 27 units at bedtime and adding Novolog 3 units TID with meals for meal coverage if patient eats at least 50% of meals.  Thanks, Orlando Penner, RN, MSN, CDCES Diabetes Coordinator Inpatient Diabetes Program [phone] (Team Pager from 8am to 5pm)

## 2024-01-20 NOTE — Plan of Care (Signed)
  Problem: Education: Goal: Knowledge of General Education information will improve Description: Including pain rating scale, medication(s)/side effects and non-pharmacologic comfort measures Outcome: Adequate for Discharge   Problem: Health Behavior/Discharge Planning: Goal: Ability to manage health-related needs will improve Outcome: Progressing   Problem: Clinical Measurements: Goal: Ability to maintain clinical measurements within normal limits will improve Outcome: Progressing Goal: Will remain free from infection Outcome: Adequate for Discharge Goal: Diagnostic test results will improve Outcome: Progressing Goal: Respiratory complications will improve Outcome: Progressing Goal: Cardiovascular complication will be avoided Outcome: Progressing   Problem: Activity: Goal: Risk for activity intolerance will decrease Outcome: Adequate for Discharge   Problem: Nutrition: Goal: Adequate nutrition will be maintained Outcome: Adequate for Discharge   Problem: Coping: Goal: Level of anxiety will decrease Outcome: Adequate for Discharge   Problem: Elimination: Goal: Will not experience complications related to bowel motility Outcome: Progressing Goal: Will not experience complications related to urinary retention Outcome: Adequate for Discharge   Problem: Pain Managment: Goal: General experience of comfort will improve and/or be controlled Outcome: Adequate for Discharge   Problem: Safety: Goal: Ability to remain free from injury will improve Outcome: Adequate for Discharge   Problem: Skin Integrity: Goal: Risk for impaired skin integrity will decrease Outcome: Adequate for Discharge   Problem: Education: Goal: Ability to describe self-care measures that may prevent or decrease complications (Diabetes Survival Skills Education) will improve Outcome: Progressing Goal: Individualized Educational Video(s) Outcome: Progressing   Problem: Coping: Goal: Ability to adjust  to condition or change in health will improve Outcome: Adequate for Discharge   Problem: Fluid Volume: Goal: Ability to maintain a balanced intake and output will improve Outcome: Progressing   Problem: Health Behavior/Discharge Planning: Goal: Ability to identify and utilize available resources and services will improve Outcome: Adequate for Discharge Goal: Ability to manage health-related needs will improve Outcome: Progressing   Problem: Metabolic: Goal: Ability to maintain appropriate glucose levels will improve Outcome: Progressing   Problem: Nutritional: Goal: Maintenance of adequate nutrition will improve Outcome: Adequate for Discharge Goal: Progress toward achieving an optimal weight will improve Outcome: Progressing   Problem: Skin Integrity: Goal: Risk for impaired skin integrity will decrease Outcome: Adequate for Discharge   Problem: Tissue Perfusion: Goal: Adequacy of tissue perfusion will improve Outcome: Adequate for Discharge   Problem: Education: Goal: Understanding of CV disease, CV risk reduction, and recovery process will improve Outcome: Progressing Goal: Individualized Educational Video(s) Outcome: Progressing   Problem: Activity: Goal: Ability to return to baseline activity level will improve Outcome: Adequate for Discharge   Problem: Cardiovascular: Goal: Ability to achieve and maintain adequate cardiovascular perfusion will improve Outcome: Progressing Goal: Vascular access site(s) Level 0-1 will be maintained Outcome: Progressing   Problem: Health Behavior/Discharge Planning: Goal: Ability to safely manage health-related needs after discharge will improve Outcome: Progressing   Problem: Education: Goal: Ability to demonstrate management of disease process will improve Outcome: Progressing Goal: Ability to verbalize understanding of medication therapies will improve Outcome: Progressing Goal: Individualized Educational  Video(s) Outcome: Progressing   Problem: Activity: Goal: Capacity to carry out activities will improve Outcome: Adequate for Discharge   Problem: Cardiac: Goal: Ability to achieve and maintain adequate cardiopulmonary perfusion will improve Outcome: Progressing

## 2024-01-20 NOTE — Progress Notes (Signed)
 PROGRESS NOTE    Lucas Dudley  GNF:[id number] DOB: 1941/09/15 DOA: 01/13/2024 PCP: Anabel Halon, MD   Brief Narrative:    Lucas Dudley is a 83 y.o. male with medical history significant for systolic and diastolic CHF, prostate cancer, hypertension, paroxysmal atrial fibrillation, OSA, diabetes mellitus. Patient was sent to the ED from heme-onc office with reports of hypoxia-sats down to 84 to 90% on room air.  She reports over the past few days he has had worsening dyspnea with exertion, which improves with rest.  Reports chronic unchanged cough.  Chronic bilateral lower extremity edema, and he denies abdominal bloating.[name]  He is unaware of any weight gain. He reports compliance with his medications include torsemide 40 mg daily.    Recent hospitalized-3/1 to 3/4 for decompensated CHF, hypoxia.  Weight on discharge was 244.2 pounds.   He was recently seen in cardiology clinic 01/02/2024 with complaints of dyspnea on exertion and fatigue.  Per notes- patient with evidence of HFpEF, likely contributing, and RV dysfunction with evidence of potentially severe pulmonary hypertension by echo.  Also possible progression of his coronary artery disease which was documented 2016, he was scheduled for cardiac cath tomorrow.  Assessment & Plan:   Principal Problem:   Acute hypoxic respiratory failure (HCC) Active Problems:   Acute on chronic combined systolic and diastolic CHF (congestive heart failure) (HCC) - dry Weight 109 kg/240.3 lbs. dry weight BNP of 111   Essential hypertension   CAD in native artery   Paroxysmal atrial fibrillation (HCC)   Type 2 diabetes mellitus with other specified complication (HCC)  Assessment and Plan:  Acute hypoxic respiratory failure (HCC) -O2 sats ranging from 84 to 90% on room air.   -Patient reporting short winded sensation with activity and is requiring 2 L supplementation's to maintain saturation above 90%.  -X-rays at time of admission demonstrating  concern for bronchitic changes/atelectasis -Initially IV antibiotics has been started for presumed pneumonia -Continue treatment with diuresis with adjustment as noted below -Follow daily weights, strict I's and O's and low-sodium diet -Wean off oxygen supplementation as tolerated -Patient instructed to use flutter valve. -Procalcitonin low, discontinued doxycycline   Acute on chronic combined systolic and diastolic CHF (congestive heart failure) (HCC) - dry Weight 109 kg/240.3 lbs. dry weight BNP of 111 -Dyspnea on exertion, hypoxia, chronic bilateral lower extremity swelling.  Recent discharge with 244.2 Lbs, today he is weighing 241 pounds -As mentioned above continue IV diuresis -Reds clip measurement has been requested -Low-sodium diet discussed with patient. -Continue ongoing diuresis with IV Lasix and metolazone.  Appreciate ongoing cardiology input.   CAD in native artery Denies chest pain at this time.  Troponin 31 > 34.  EKG unchanged.   - Recent Cardiology notes for DOE- 01/02/2024- patient with evidence of HFpEF, likely contributing, and RV dysfunction with evidence of potentially severe pulmonary hypertension by echo.  Also possible progression of his coronary artery disease which was documented 2016, he was scheduled for cardiac cath tomorrow- 3/31. - EDP talked with cardiology on-call, admit here, cardiac cath will be re-scheduled. -Patient denies chest pain -Continue telemetry monitoring -Continue treatment with aspirin.   Essential hypertension-stable -Holding hydralazine in order to provide room for diuresis -Blood pressure currently controlled with Lasix twice a day -Heart healthy/low-sodium diet discussed with patient -Continue to follow vital signs fluctuation.  Iron deficiency anemia -Supplement IV iron and B12 -Monitor hemoglobin trend -Thought to have occult bleeding and follows hematology   Type 2 diabetes mellitus with  other specified complication (HCC) A1c  8.3. -Continue sliding scale insulin and Semglee -Holding oral hypoglycemic agent -Follow CBG fluctuation.   Paroxysmal atrial fibrillation (HCC) -Currently in sinus rhythm.  Follows with cardiology. -Status post watchman's device.  He is not on anticoagulation -Continue telemetry monitoring -Continue to follow ultralights and replete as needed; goal is for potassium above 4 and magnesium above 2   Class II obesity -Body mass index is 35.43 kg/m. -Low-calorie diet, portion control and increase physical activity discussed with patient.    DVT prophylaxis: Lovenox Code Status: Full Family Communication: Daughter on phone 4/2 Disposition Plan:  Status is: Inpatient Remains inpatient appropriate because: Need for IV medications.   Consultants:  Cardiology  Procedures:  None  Antimicrobials:  Anti-infectives (From admission, onward)    Start     Dose/Rate Route Frequency Ordered Stop   01/14/24 2200  doxycycline (VIBRA-TABS) tablet 100 mg  Status:  Discontinued        100 mg Oral Every 12 hours 01/14/24 1702 01/16/24 0937   01/13/24 2200  cefTRIAXone (ROCEPHIN) 2 g in sodium chloride 0.9 % 100 mL IVPB  Status:  Discontinued        2 g 200 mL/hr over 30 Minutes Intravenous Every 24 hours 01/13/24 2006 01/14/24 1702   01/13/24 2200  doxycycline (VIBRAMYCIN) 100 mg in sodium chloride 0.9 % 250 mL IVPB  Status:  Discontinued        100 mg 125 mL/hr over 120 Minutes Intravenous Every 12 hours 01/13/24 2006 01/14/24 1702       Subjective: Patient seen and evaluated today with no new acute complaints or concerns. No acute concerns or events noted overnight.  He continues to require oxygen and continues to have shortness of breath.  He has robust diuresis with ongoing regimen which will be continued per cardiology recommendations.  Objective: Vitals:   01/19/24 1308 01/19/24 2018 01/20/24 0341 01/20/24 0500  BP: (!) 124/58 (!) 133/53 (!) 102/42   Pulse: (!) 56 60 61   Resp:   18 19   Temp: 98.1 F (36.7 C) (!) 97.5 F (36.4 C) 98.1 F (36.7 C)   TempSrc: Oral Oral Oral   SpO2: 100% 100% 92%   Weight:    109.5 kg  Height:        Intake/Output Summary (Last 24 hours) at 01/20/2024 0916 Last data filed at 01/20/2024 1610 Gross per 24 hour  Intake 240 ml  Output 3950 ml  Net -3710 ml   Filed Weights   01/18/24 0500 01/19/24 0303 01/20/24 0500  Weight: 112 kg 111.8 kg 109.5 kg    Examination:  General exam: Appears calm and comfortable  Respiratory system: Clear to auscultation. Respiratory effort normal.  2 L nasal cannula Cardiovascular system: S1 & S2 heard, RRR.  Gastrointestinal system: Abdomen is soft Central nervous system: Alert and awake Extremities: 2+ bilateral ankle edema Skin: No significant lesions noted Psychiatry: Flat affect.    Data Reviewed: I have personally reviewed following labs and imaging studies  CBC: Recent Labs  Lab 01/13/24 1609 01/15/24 0435 01/16/24 0500 01/17/24 0503 01/18/24 0527 01/19/24 0414  WBC 8.7 7.9 8.7 9.9 9.1 8.3  NEUTROABS 7.1  --   --   --   --   --   HGB 8.8* 8.9* 8.5* 8.7* 8.3* 8.0*  HCT 28.6* 29.6* 27.4* 28.2* 27.6* 26.3*  MCV 97.6 99.3 95.8 95.9 96.2 96.7  PLT 235 254 252 278 296 336   Basic Metabolic Panel: Recent Labs  Lab 01/16/24 0500 01/17/24 0503 01/18/24 0527 01/19/24 0414 01/20/24 0450  NA 135 132* 133* 137 137  K 4.2 3.8 3.5 3.4* 2.8*  CL 94* 90* 91* 90* 85*  CO2 26 32 32 35* 37*  GLUCOSE 167* 292* 227* 252* 193*  BUN 20 23 29* 30* 32*  CREATININE 1.27* 1.50* 1.45* 1.60* 1.62*  CALCIUM 8.4* 9.0 9.1 9.3 9.6  MG 1.7 1.9 1.8 1.8 1.8   GFR: Estimated Creatinine Clearance: 44.3 mL/min (A) (by C-G formula based on SCr of 1.62 mg/dL (H)). Liver Function Tests: Recent Labs  Lab 01/13/24 1609  AST 28  ALT 20  ALKPHOS 52  BILITOT 0.7  PROT 7.5  ALBUMIN 3.6   No results for input(s): "LIPASE", "AMYLASE" in the last 168 hours. No results for input(s): "AMMONIA" in the  last 168 hours. Coagulation Profile: No results for input(s): "INR", "PROTIME" in the last 168 hours. Cardiac Enzymes: No results for input(s): "CKTOTAL", "CKMB", "CKMBINDEX", "TROPONINI" in the last 168 hours. BNP (last 3 results) No results for input(s): "PROBNP" in the last 8760 hours. HbA1C: No results for input(s): "HGBA1C" in the last 72 hours. CBG: Recent Labs  Lab 01/19/24 0726 01/19/24 1122 01/19/24 1614 01/19/24 2015 01/20/24 0748  GLUCAP 231* 251* 270* 281* 197*   Lipid Profile: No results for input(s): "CHOL", "HDL", "LDLCALC", "TRIG", "CHOLHDL", "LDLDIRECT" in the last 72 hours. Thyroid Function Tests: No results for input(s): "TSH", "T4TOTAL", "FREET4", "T3FREE", "THYROIDAB" in the last 72 hours. Anemia Panel: No results for input(s): "VITAMINB12", "FOLATE", "FERRITIN", "TIBC", "IRON", "RETICCTPCT" in the last 72 hours. Sepsis Labs: Recent Labs  Lab 01/15/24 0435  PROCALCITON <0.10    No results found for this or any previous visit (from the past 240 hours).       Radiology Studies: No results found.   Scheduled Meds:  amiodarone  200 mg Oral QHS   aspirin  81 mg Oral Daily   dapagliflozin propanediol  10 mg Oral Daily   docusate sodium  100 mg Oral BID   enoxaparin (LOVENOX) injection  60 mg Subcutaneous Q24H   furosemide  80 mg Intravenous Q12H   insulin aspart  0-15 Units Subcutaneous TID WC   insulin aspart  0-5 Units Subcutaneous QHS   insulin aspart  3 Units Subcutaneous TID WC   insulin glargine-yfgn  27 Units Subcutaneous QHS   metolazone  2.5 mg Oral Once   pantoprazole  40 mg Oral Daily   polyethylene glycol  17 g Oral Daily   potassium chloride  40 mEq Oral BID     LOS: 7 days    Time spent: 55 minutes    Tu Shimmel Hoover Brunette, DO [facility]  If 7PM-7AM, please contact night-coverage [email or web address] 01/20/2024, 9:16 AM

## 2024-01-20 NOTE — Progress Notes (Signed)
   01/20/24 0800  ReDS Vest / Clip  Station Marker D  Ruler Value 35  ReDS Value Range (!) > 40  ReDS Actual Value 44

## 2024-01-21 DIAGNOSIS — J9601 Acute respiratory failure with hypoxia: Secondary | ICD-10-CM | POA: Diagnosis not present

## 2024-01-21 LAB — CBC
HCT: 28.5 % — ABNORMAL LOW (ref 39.0–52.0)
Hemoglobin: 8.6 g/dL — ABNORMAL LOW (ref 13.0–17.0)
MCH: 28.8 pg (ref 26.0–34.0)
MCHC: 30.2 g/dL (ref 30.0–36.0)
MCV: 95.3 fL (ref 80.0–100.0)
Platelets: 412 10*3/uL — ABNORMAL HIGH (ref 150–400)
RBC: 2.99 MIL/uL — ABNORMAL LOW (ref 4.22–5.81)
RDW: 18.4 % — ABNORMAL HIGH (ref 11.5–15.5)
WBC: 8 10*3/uL (ref 4.0–10.5)
nRBC: 0 % (ref 0.0–0.2)

## 2024-01-21 LAB — BASIC METABOLIC PANEL WITH GFR
Anion gap: 11 (ref 5–15)
BUN: 32 mg/dL — ABNORMAL HIGH (ref 8–23)
CO2: 41 mmol/L — ABNORMAL HIGH (ref 22–32)
Calcium: 9.3 mg/dL (ref 8.9–10.3)
Chloride: 85 mmol/L — ABNORMAL LOW (ref 98–111)
Creatinine, Ser: 1.66 mg/dL — ABNORMAL HIGH (ref 0.61–1.24)
GFR, Estimated: 41 mL/min — ABNORMAL LOW (ref >60)
Glucose, Bld: 145 mg/dL — ABNORMAL HIGH (ref 70–99)
Potassium: 3.4 mmol/L — ABNORMAL LOW (ref 3.5–5.1)
Sodium: 137 mmol/L (ref 135–145)

## 2024-01-21 LAB — MAGNESIUM: Magnesium: 1.8 mg/dL (ref 1.7–2.4)

## 2024-01-21 LAB — GLUCOSE, CAPILLARY
Glucose-Capillary: 188 mg/dL — ABNORMAL HIGH (ref 70–99)
Glucose-Capillary: 303 mg/dL — ABNORMAL HIGH (ref 70–99)
Glucose-Capillary: 232 mg/dL — ABNORMAL HIGH (ref 70–99)
Glucose-Capillary: 156 mg/dL — ABNORMAL HIGH (ref 70–99)

## 2024-01-21 MED ORDER — BISACODYL 10 MG RE SUPP
10.0000 mg | Freq: Once | RECTAL | Status: AC
  Administered 2024-01-21: 10 mg via RECTAL
  Filled 2024-01-21: qty 1

## 2024-01-21 NOTE — Progress Notes (Signed)
 PROGRESS NOTE    Lucas Dudley  AVW:[id number] DOB: 1941/05/11 DOA: 01/13/2024 PCP: Anabel Halon, MD   Brief Narrative:    Lucas Dudley is a 83 y.o. male with medical history significant for systolic and diastolic CHF, prostate cancer, hypertension, paroxysmal atrial fibrillation, OSA, diabetes mellitus. Patient was sent to the ED from heme-onc office with reports of hypoxia-sats down to 84 to 90% on room air.  She reports over the past few days he has had worsening dyspnea with exertion, which improves with rest.  Reports chronic unchanged cough.  Chronic bilateral lower extremity edema, and he denies abdominal bloating.[name]  He is unaware of any weight gain. He reports compliance with his medications include torsemide 40 mg daily.    Recent hospitalized-3/1 to 3/4 for decompensated CHF, hypoxia.  Weight on discharge was 244.2 pounds.   He was recently seen in cardiology clinic 01/02/2024 with complaints of dyspnea on exertion and fatigue.  Per notes- patient with evidence of HFpEF, likely contributing, and RV dysfunction with evidence of potentially severe pulmonary hypertension by echo.  Also possible progression of his coronary artery disease which was documented 2016, he was scheduled for cardiac cath tomorrow.  Assessment & Plan:   Principal Problem:   Acute hypoxic respiratory failure (HCC) Active Problems:   Acute on chronic combined systolic and diastolic CHF (congestive heart failure) (HCC) - dry Weight 109 kg/240.3 lbs. dry weight BNP of 111   Essential hypertension   CAD in native artery   Paroxysmal atrial fibrillation (HCC)   Type 2 diabetes mellitus with other specified complication (HCC)  Assessment and Plan:  Acute hypoxic respiratory failure (HCC) -O2 sats ranging from 84 to 90% on room air.   -Patient reporting short winded sensation with activity and is requiring 2 L supplementation's to maintain saturation above 90%.  -Will try to wean oxygen to off during rest  today -X-rays at time of admission demonstrating concern for bronchitic changes/atelectasis -Continue treatment with diuresis with adjustment as noted below -Follow daily weights, strict I's and O's and low-sodium diet -Wean off oxygen supplementation as tolerated -Patient instructed to use flutter valve.   Acute on chronic combined systolic and diastolic CHF (congestive heart failure) (HCC) - dry Weight 109 kg/240.3 lbs. dry weight BNP of 111 -Dyspnea on exertion, hypoxia, chronic bilateral lower extremity swelling.  Recent discharge with 244.2 Lbs, today he is weighing 240 pounds -As mentioned above continue IV diuresis -Reds clip measurement has been requested -Low-sodium diet discussed with patient. -Continue ongoing diuresis with IV Lasix and metolazone.  Appreciate ongoing cardiology input. -Hopefully can plan for discharge in the next 1-2 days as creatinine levels are increasing and he is nearing his dry weight.   CAD in native artery Denies chest pain at this time.  Troponin 31 > 34.  EKG unchanged.   - Recent Cardiology notes for [facility]- 01/02/2024- patient with evidence of HFpEF, likely contributing, and RV dysfunction with evidence of potentially severe pulmonary hypertension by echo.  Also possible progression of his coronary artery disease which was documented 2016, he was scheduled for cardiac cath tomorrow- 3/31. - EDP talked with cardiology on-call, admit here, cardiac cath will be re-scheduled. -Patient denies chest pain -Continue telemetry monitoring -Continue treatment with aspirin.   Essential hypertension-stable -Holding hydralazine in order to provide room for diuresis -Blood pressure currently controlled with Lasix twice a day -Heart healthy/low-sodium diet discussed with patient -Continue to follow vital signs fluctuation.  Iron deficiency anemia -Supplement IV iron and  B12 -Monitor hemoglobin trend -Thought to have occult bleeding and follows hematology   Type 2  diabetes mellitus with other specified complication (HCC) A1c 8.3. -Continue sliding scale insulin and Semglee -Holding oral hypoglycemic agent -Follow CBG fluctuation.   Paroxysmal atrial fibrillation (HCC) -Currently in sinus rhythm.  Follows with cardiology. -Status post watchman's device.  He is not on anticoagulation -Continue telemetry monitoring -Continue to follow ultralights and replete as needed; goal is for potassium above 4 and magnesium above 2   Class II obesity -Body mass index is 35.43 kg/m. -Low-calorie diet, portion control and increase physical activity discussed with patient.    DVT prophylaxis: Lovenox Code Status: Full Family Communication: Daughter on phone 4/8 Disposition Plan:  Status is: Inpatient Remains inpatient appropriate because: Need for IV medications.   Consultants:  Cardiology  Procedures:  None  Antimicrobials:  Anti-infectives (From admission, onward)    Start     Dose/Rate Route Frequency Ordered Stop   01/14/24 2200  doxycycline (VIBRA-TABS) tablet 100 mg  Status:  Discontinued        100 mg Oral Every 12 hours 01/14/24 1702 01/16/24 0937   01/13/24 2200  cefTRIAXone (ROCEPHIN) 2 g in sodium chloride 0.9 % 100 mL IVPB  Status:  Discontinued        2 g 200 mL/hr over 30 Minutes Intravenous Every 24 hours 01/13/24 2006 01/14/24 1702   01/13/24 2200  doxycycline (VIBRAMYCIN) 100 mg in sodium chloride 0.9 % 250 mL IVPB  Status:  Discontinued        100 mg 125 mL/hr over 120 Minutes Intravenous Every 12 hours 01/13/24 2006 01/14/24 1702       Subjective: Patient seen and evaluated today with no new acute complaints or concerns. No acute concerns or events noted overnight.  He continues to require oxygen and continues to have shortness of breath.  He has robust diuresis with ongoing regimen which will be continued per cardiology recommendations.  Objective: Vitals:   01/20/24 1740 01/20/24 2051 01/21/24 0439 01/21/24 0537  BP:  123/65 (!) 133/54 (!) 120/58   Pulse: (!) 57 (!) 54 (!) 59   Resp:  19 18   Temp:  97.9 F (36.6 C) 98.4 F (36.9 C)   TempSrc:  Oral Oral   SpO2: 97% 95% 97%   Weight:    109.2 kg  Height:        Intake/Output Summary (Last 24 hours) at 01/21/2024 1147 Last data filed at 01/21/2024 0900 Gross per 24 hour  Intake 1138.07 ml  Output 3400 ml  Net -2261.93 ml   Filed Weights   01/19/24 0303 01/20/24 0500 01/21/24 0537  Weight: 111.8 kg 109.5 kg 109.2 kg    Examination:  General exam: Appears calm and comfortable  Respiratory system: Clear to auscultation. Respiratory effort normal.  2 L nasal cannula Cardiovascular system: S1 & S2 heard, RRR.  Gastrointestinal system: Abdomen is soft Central nervous system: Alert and awake Extremities: 2+ bilateral ankle edema Skin: No significant lesions noted Psychiatry: Flat affect.    Data Reviewed: I have personally reviewed following labs and imaging studies  CBC: Recent Labs  Lab 01/16/24 0500 01/17/24 0503 01/18/24 0527 01/19/24 0414 01/20/24 1154 01/21/24 0446  WBC 8.7 9.9 9.1 8.3  --  8.0  HGB 8.5* 8.7* 8.3* 8.0* 8.8* 8.6*  HCT 27.4* 28.2* 27.6* 26.3* 28.3* 28.5*  MCV 95.8 95.9 96.2 96.7  --  95.3  PLT 252 278 296 336  --  412*   Basic  Metabolic Panel: Recent Labs  Lab 01/17/24 0503 01/18/24 0527 01/19/24 0414 01/20/24 0450 01/21/24 0446  NA 132* 133* 137 137 137  K 3.8 3.5 3.4* 2.8* 3.4*  CL 90* 91* 90* 85* 85*  CO2 32 32 35* 37* 41*  GLUCOSE 292* 227* 252* 193* 145*  BUN 23 29* 30* 32* 32*  CREATININE 1.50* 1.45* 1.60* 1.62* 1.66*  CALCIUM 9.0 9.1 9.3 9.6 9.3  MG 1.9 1.8 1.8 1.8 1.8   GFR: Estimated Creatinine Clearance: 43.2 mL/min (A) (by C-G formula based on SCr of 1.66 mg/dL (H)). Liver Function Tests: No results for input(s): "AST", "ALT", "ALKPHOS", "BILITOT", "PROT", "ALBUMIN" in the last 168 hours.  No results for input(s): "LIPASE", "AMYLASE" in the last 168 hours. No results for input(s):  "AMMONIA" in the last 168 hours. Coagulation Profile: No results for input(s): "INR", "PROTIME" in the last 168 hours. Cardiac Enzymes: No results for input(s): "CKTOTAL", "CKMB", "CKMBINDEX", "TROPONINI" in the last 168 hours. BNP (last 3 results) No results for input(s): "PROBNP" in the last 8760 hours. HbA1C: No results for input(s): "HGBA1C" in the last 72 hours. CBG: Recent Labs  Lab 01/20/24 1125 01/20/24 1631 01/20/24 2050 01/21/24 0731 01/21/24 1116  GLUCAP 261* 234* 285* 188* 303*   Lipid Profile: No results for input(s): "CHOL", "HDL", "LDLCALC", "TRIG", "CHOLHDL", "LDLDIRECT" in the last 72 hours. Thyroid Function Tests: No results for input(s): "TSH", "T4TOTAL", "FREET4", "T3FREE", "THYROIDAB" in the last 72 hours. Anemia Panel: No results for input(s): "VITAMINB12", "FOLATE", "FERRITIN", "TIBC", "IRON", "RETICCTPCT" in the last 72 hours. Sepsis Labs: Recent Labs  Lab 01/15/24 0435  PROCALCITON <0.10    No results found for this or any previous visit (from the past 240 hours).       Radiology Studies: DG Chest 2 View Result Date: 01/20/2024 CLINICAL DATA:  Shortness of breath EXAM: CHEST - 2 VIEW COMPARISON:  Chest x-ray performed January 13, 2024 FINDINGS: There is improved aeration in the left lung base. Heart and mediastinum are not significantly changed. No pneumothorax or pleural effusion. IMPRESSION: 1.  Improved aeration the left lung base. Electronically Signed   By: Lowell Guitar M.D.   On: 01/20/2024 13:30     Scheduled Meds:  amiodarone  200 mg Oral QHS   aspirin  81 mg Oral Daily   dapagliflozin propanediol  10 mg Oral Daily   docusate sodium  100 mg Oral BID   enoxaparin (LOVENOX) injection  60 mg Subcutaneous Q24H   furosemide  80 mg Intravenous Q12H   insulin aspart  0-15 Units Subcutaneous TID WC   insulin aspart  0-5 Units Subcutaneous QHS   insulin aspart  3 Units Subcutaneous TID WC   insulin glargine-yfgn  27 Units Subcutaneous QHS    pantoprazole  40 mg Oral Daily   polyethylene glycol  17 g Oral Daily   potassium chloride  40 mEq Oral BID     LOS: 8 days    Time spent: 55 minutes    Alvah Gilder Hoover Brunette, DO [facility]  If 7PM-7AM, please contact night-coverage [email or web address] 01/21/2024, 11:47 AM

## 2024-01-21 NOTE — Plan of Care (Signed)
  Problem: Education: Goal: Knowledge of General Education information will improve Description: Including pain rating scale, medication(s)/side effects and non-pharmacologic comfort measures Outcome: Progressing   Problem: Health Behavior/Discharge Planning: Goal: Ability to manage health-related needs will improve Outcome: Progressing   Problem: Clinical Measurements: Goal: Ability to maintain clinical measurements within normal limits will improve Outcome: Progressing Goal: Will remain free from infection Outcome: Progressing Goal: Diagnostic test results will improve Outcome: Progressing Goal: Respiratory complications will improve Outcome: Progressing Goal: Cardiovascular complication will be avoided Outcome: Progressing   Problem: Activity: Goal: Risk for activity intolerance will decrease Outcome: Progressing   Problem: Nutrition: Goal: Adequate nutrition will be maintained Outcome: Progressing   Problem: Coping: Goal: Level of anxiety will decrease Outcome: Progressing   Problem: Elimination: Goal: Will not experience complications related to bowel motility Outcome: Progressing Goal: Will not experience complications related to urinary retention Outcome: Progressing   Problem: Pain Managment: Goal: General experience of comfort will improve and/or be controlled Outcome: Progressing   Problem: Safety: Goal: Ability to remain free from injury will improve Outcome: Progressing   Problem: Skin Integrity: Goal: Risk for impaired skin integrity will decrease Outcome: Progressing   Problem: Education: Goal: Ability to describe self-care measures that may prevent or decrease complications (Diabetes Survival Skills Education) will improve Outcome: Progressing Goal: Individualized Educational Video(s) Outcome: Progressing   Problem: Coping: Goal: Ability to adjust to condition or change in health will improve Outcome: Progressing   Problem: Fluid  Volume: Goal: Ability to maintain a balanced intake and output will improve Outcome: Progressing   Problem: Health Behavior/Discharge Planning: Goal: Ability to identify and utilize available resources and services will improve Outcome: Progressing Goal: Ability to manage health-related needs will improve Outcome: Progressing   Problem: Metabolic: Goal: Ability to maintain appropriate glucose levels will improve Outcome: Progressing   Problem: Nutritional: Goal: Maintenance of adequate nutrition will improve Outcome: Progressing Goal: Progress toward achieving an optimal weight will improve Outcome: Progressing   Problem: Skin Integrity: Goal: Risk for impaired skin integrity will decrease Outcome: Progressing   Problem: Tissue Perfusion: Goal: Adequacy of tissue perfusion will improve Outcome: Progressing   Problem: Education: Goal: Understanding of CV disease, CV risk reduction, and recovery process will improve Outcome: Progressing Goal: Individualized Educational Video(s) Outcome: Progressing   Problem: Activity: Goal: Ability to return to baseline activity level will improve Outcome: Progressing   Problem: Cardiovascular: Goal: Ability to achieve and maintain adequate cardiovascular perfusion will improve Outcome: Progressing Goal: Vascular access site(s) Level 0-1 will be maintained Outcome: Progressing   Problem: Health Behavior/Discharge Planning: Goal: Ability to safely manage health-related needs after discharge will improve Outcome: Progressing   Problem: Education: Goal: Ability to demonstrate management of disease process will improve Outcome: Progressing Goal: Ability to verbalize understanding of medication therapies will improve Outcome: Progressing Goal: Individualized Educational Video(s) Outcome: Progressing   Problem: Activity: Goal: Capacity to carry out activities will improve Outcome: Progressing   Problem: Cardiac: Goal: Ability to  achieve and maintain adequate cardiopulmonary perfusion will improve Outcome: Progressing

## 2024-01-21 NOTE — Progress Notes (Incomplete)
 Rounding Note    Patient Name: Lucas Dudley Date of Encounter: 01/22/2024  [facility] Cardiologist: Nona Dell, MD    Subjective   Chart reviewed including recent follow-up by Dr. Tenny Craw.  Does feel better although still not at baseline, feels weak, no chest pain or shortness of breath at rest.  Leg edema improved.  Follow-up ReDS clip 59.  Spoke with patient's daughter by phone today as well.  Inpatient Medications    Scheduled Meds:  amiodarone  200 mg Oral QHS   aspirin  81 mg Oral Daily   dapagliflozin propanediol  10 mg Oral Daily   docusate sodium  100 mg Oral BID   enoxaparin (LOVENOX) injection  60 mg Subcutaneous Q24H   furosemide  80 mg Intravenous Q12H   insulin aspart  0-15 Units Subcutaneous TID WC   insulin aspart  0-5 Units Subcutaneous QHS   insulin aspart  3 Units Subcutaneous TID WC   insulin glargine-yfgn  27 Units Subcutaneous QHS   pantoprazole  40 mg Oral Daily   polyethylene glycol  17 g Oral Daily   potassium chloride  40 mEq Oral BID    PRN Meds: acetaminophen, albuterol, diphenhydrAMINE, naphazoline-glycerin, traZODone   Vital Signs    Vitals:   01/21/24 1955 01/21/24 2000 01/22/24 0616 01/22/24 0908  BP: (!) 149/65  (!) 121/55 111/89  Pulse: 64  (!) 57 (!) 59  Resp: 19  19   Temp: 98.9 F (37.2 C)  99 F (37.2 C) 98.4 F (36.9 C)  TempSrc: Oral  Oral Oral  SpO2: (!) 85% 95% 100% 100%  Weight:   109.5 kg   Height:        Intake/Output Summary (Last 24 hours) at 01/22/2024 1202 Last data filed at 01/22/2024 0626 Gross per 24 hour  Intake 600 ml  Output 1100 ml  Net -500 ml      01/22/2024    6:16 AM 01/21/2024    5:37 AM 01/20/2024    5:00 AM  Last 3 Weights  Weight (lbs) 241 lb 6.5 oz 240 lb 11.9 oz 241 lb 6.5 oz  Weight (kg) 109.5 kg 109.2 kg 109.5 kg      Telemetry    SR with PVCs.  Heart rate 60s.- Personally Reviewed  Physical Exam   GEN: No acute distress.   Neck: No JVD Cardiac: RRR, 1/6 systolic  murmur. Respiratory: Relatively clear. GI: Soft, nontender, non-distended  MS: 1+ bilateral LE edema Neuro:  Nonfocal  Psych: Normal affect   Labs    High Sensitivity Troponin:   Recent Labs  Lab 01/13/24 1609 01/13/24 1739  TROPONINIHS 31* 34*     Chemistry Recent Labs  Lab 01/20/24 0450 01/21/24 0446 01/22/24 0539  NA 137 137 135  K 2.8* 3.4* 3.5  CL 85* 85* 85*  CO2 37* 41* 37*  GLUCOSE 193* 145* 170*  BUN 32* 32* 35*  CREATININE 1.62* 1.66* 1.74*  CALCIUM 9.6 9.3 9.7  MG 1.8 1.8 1.9  GFRNONAA 42* 41* 39*  ANIONGAP 15 11 13      Hematology Recent Labs  Lab 01/19/24 0414 01/20/24 1154 01/21/24 0446 01/22/24 0539  WBC 8.3  --  8.0 9.6  RBC 2.72*  --  2.99* 3.27*  HGB 8.0* 8.8* 8.6* 9.3*  HCT 26.3* 28.3* 28.5* 31.2*  MCV 96.7  --  95.3 95.4  MCH 29.4  --  28.8 28.4  MCHC 30.4  --  30.2 29.8*  RDW 19.0*  --  18.4* 18.6*  PLT 336  --  412* 532*    Radiology    No results found.  Cardiac Studies   Echocardiogram 02/14/2024:  1. Left ventricular ejection fraction, by estimation, is 55 to 60%. Left  ventricular ejection fraction by PLAX is 59 %. The left ventricle has  normal function. The left ventricle has no regional wall motion  abnormalities. There is moderate concentric  left ventricular hypertrophy. Left ventricular diastolic parameters are  indeterminate.   2. Right ventricular systolic function is mildly reduced. The right  ventricular size is normal. There is severely elevated pulmonary artery  systolic pressure. The estimated right ventricular systolic pressure is  62.6 mmHg.   3. The mitral valve is grossly normal. No evidence of mitral valve  regurgitation.   4. The aortic valve is tricuspid. Aortic valve regurgitation is not  visualized. Aortic valve sclerosis/calcification is present, without any  evidence of aortic stenosis.   5. The inferior vena cava is dilated in size with <50% respiratory  variability, suggesting right atrial  pressure of 15 mmHg.   Assessment & Plan    1.  Acute on chronic HFpEF, presenting with fluid overload.  He has mild RV dysfunction with evidence of severe pulmonary hypertension by recent echocardiogram in March.  Diuresing on divided dose IV Lasix 80 mg twice daily, has also received metolazone intermittently.  Approximately 12 L net urine output so far.  Reds vest reading 43 indicating residual fluid retention.  He is also on Farxiga 10 mg daily and potassium supplement.  2. CAD with occluded first diagonal and otherwise moderate LAD and PDA disease managed medically so far.  3.  Persistent atrial fibrillation with CHA2DS2-VASc score of 5 status post Watchman implantation by Dr. Excell Seltzer.  He continues on amiodarone for rhythm suppression.  4.  Primary hypertension.  5.  Mixed hyperlipidemia with statin intolerance has been on Lopid as an outpatient.  6.  History of GI bleed with iron deficiency anemia. Has history of small bowel AVMs.  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  Hemoglobin has been relatively stable, most recently up to 9.3.  7. 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  Chart reviewed, situation discussed with the patient and also his daughter by phone.  Plan to continue IV diuresis, switch to Lasix infusion.  Will ask hospitalist team to arrange for bed at [facility].  With optimizing fluid status, anticipate that he should be able to proceed with original plan for left and right heart catheterization to help guide further treatment of his pulmonary hypertension and fluid status.  Revascularization would still be tricky given GI bleed history (would have concerns about utilizing dual antiplatelet therapy), recent hemoglobin has been stable.  For questions or updates, please contact [facility] Please consult [email or web address] for contact info under        Signed, Nona Dell, MD  01/22/2024, 12:02 PM

## 2024-01-22 DIAGNOSIS — E1169 Type 2 diabetes mellitus with other specified complication: Secondary | ICD-10-CM | POA: Diagnosis not present

## 2024-01-22 DIAGNOSIS — I272 Pulmonary hypertension, unspecified: Secondary | ICD-10-CM | POA: Insufficient documentation

## 2024-01-22 DIAGNOSIS — I5033 Acute on chronic diastolic (congestive) heart failure: Secondary | ICD-10-CM

## 2024-01-22 DIAGNOSIS — J9601 Acute respiratory failure with hypoxia: Secondary | ICD-10-CM | POA: Diagnosis not present

## 2024-01-22 DIAGNOSIS — I10 Essential (primary) hypertension: Secondary | ICD-10-CM | POA: Diagnosis not present

## 2024-01-22 DIAGNOSIS — I25119 Atherosclerotic heart disease of native coronary artery with unspecified angina pectoris: Secondary | ICD-10-CM | POA: Diagnosis not present

## 2024-01-22 DIAGNOSIS — I480 Paroxysmal atrial fibrillation: Secondary | ICD-10-CM | POA: Diagnosis not present

## 2024-01-22 LAB — MAGNESIUM: Magnesium: 1.9 mg/dL (ref 1.7–2.4)

## 2024-01-22 LAB — BASIC METABOLIC PANEL WITH GFR
Anion gap: 13 (ref 5–15)
BUN: 35 mg/dL — ABNORMAL HIGH (ref 8–23)
CO2: 37 mmol/L — ABNORMAL HIGH (ref 22–32)
Calcium: 9.7 mg/dL (ref 8.9–10.3)
Chloride: 85 mmol/L — ABNORMAL LOW (ref 98–111)
Creatinine, Ser: 1.74 mg/dL — ABNORMAL HIGH (ref 0.61–1.24)
GFR, Estimated: 39 mL/min — ABNORMAL LOW (ref >60)
Glucose, Bld: 170 mg/dL — ABNORMAL HIGH (ref 70–99)
Potassium: 3.5 mmol/L (ref 3.5–5.1)
Sodium: 135 mmol/L (ref 135–145)

## 2024-01-22 LAB — GLUCOSE, CAPILLARY
Glucose-Capillary: 173 mg/dL — ABNORMAL HIGH (ref 70–99)
Glucose-Capillary: 294 mg/dL — ABNORMAL HIGH (ref 70–99)
Glucose-Capillary: 228 mg/dL — ABNORMAL HIGH (ref 70–99)
Glucose-Capillary: 157 mg/dL — ABNORMAL HIGH (ref 70–99)

## 2024-01-22 LAB — CBC
HCT: 31.2 % — ABNORMAL LOW (ref 39.0–52.0)
Hemoglobin: 9.3 g/dL — ABNORMAL LOW (ref 13.0–17.0)
MCH: 28.4 pg (ref 26.0–34.0)
MCHC: 29.8 g/dL — ABNORMAL LOW (ref 30.0–36.0)
MCV: 95.4 fL (ref 80.0–100.0)
Platelets: 532 10*3/uL — ABNORMAL HIGH (ref 150–400)
RBC: 3.27 MIL/uL — ABNORMAL LOW (ref 4.22–5.81)
RDW: 18.6 % — ABNORMAL HIGH (ref 11.5–15.5)
WBC: 9.6 10*3/uL (ref 4.0–10.5)
nRBC: 0 % (ref 0.0–0.2)

## 2024-01-22 MED ORDER — FUROSEMIDE 10 MG/ML IJ SOLN
4.0000 mg/h | INTRAVENOUS | Status: DC
  Administered 2024-01-22: 8 mg/h via INTRAVENOUS
  Administered 2024-01-23: 4 mg/h via INTRAVENOUS
  Filled 2024-01-22 (×2): qty 20

## 2024-01-22 MED ORDER — INSULIN ASPART 100 UNIT/ML IJ SOLN
10.0000 [IU] | Freq: Three times a day (TID) | INTRAMUSCULAR | Status: DC
  Administered 2024-01-22 – 2024-01-28 (×15): 10 [IU] via SUBCUTANEOUS

## 2024-01-22 MED ORDER — INSULIN GLARGINE-YFGN 100 UNIT/ML ~~LOC~~ SOLN
30.0000 [IU] | Freq: Every day | SUBCUTANEOUS | Status: DC
  Administered 2024-01-22 – 2024-01-27 (×6): 30 [IU] via SUBCUTANEOUS
  Filled 2024-01-22 (×9): qty 0.3

## 2024-01-22 NOTE — Progress Notes (Addendum)
 Patient has rested well. Vitals have been stable and no events overnight.  Patient has been stable on 3 liters, oxygen desaturation in the 80s if oxygen is turned down. Patient has had no complaints of pain. Weight is 109.5 kg this am. Left leg was weeping fluid earlier in shift.

## 2024-01-22 NOTE — Progress Notes (Addendum)
 Patient Name: Lucas Dudley Date of Encounter: 01/22/2024 [place] [facility] Cardiologist: Lucas Dell, MD   Interval Summary  .    Shortness of breath has improved. Still volume overloaded on exam.  Vital Signs .    Vitals:   01/22/24 0616 01/22/24 0908 01/22/24 1320 01/22/24 1630  BP: (!) 121/55 111/89 135/71   Pulse: (!) 57 (!) 59 (!) 54   Resp: 19  16 16   Temp: 99 F (37.2 C) 98.4 F (36.9 C) 97.9 F (36.6 C) 98 F (36.7 C)  TempSrc: Oral Oral  Oral  SpO2: 100% 100% 90% 95%  Weight: 109.5 kg     Height:        Intake/Output Summary (Last 24 hours) at 01/22/2024 1822 Last data filed at 01/22/2024 1720 Gross per 24 hour  Intake 480.84 ml  Output 1875 ml  Net -1394.16 ml      01/22/2024    6:16 AM 01/21/2024    5:37 AM 01/20/2024    5:00 AM  Last 3 Weights  Weight (lbs) 241 lb 6.5 oz 240 lb 11.9 oz 241 lb 6.5 oz  Weight (kg) 109.5 kg 109.2 kg 109.5 kg      Telemetry/ECG    Normal sinus rhythm with a first degree av block- Personally Reviewed  Physical Exam .   GEN: No acute distress.   Neck: 4+ JVD Cardiac: RRR, no murmurs, rubs, or gallops.  Respiratory: crackles in the lower lobes. MS: 3+ edema  Assessment & Plan .    Lucas Dudley is a 83 y.o. male with medical history significant for systolic and diastolic CHF, prostate cancer diagnosed on 2007, hypertension, paroxysmal atrial fibrillation, OSA, diabetes mellitus.   Acute on chronic HFpEF with RV dysfunction Transferred [facility]  Most recent echo on 12/15/23 showed LVEF of 55-60% no regional wall motion abnormalities, moderate concentric LVH,  elevated rv systolic pressure, rv function is mildly reduced, elevated RA pressure estimated at .  Was diuresed on lasix is out 13.331 since admission. Volume overloaded on exam. currently on Lasix drip 8mg /hr. We will continue this. Plan was initially to stabilize and get a L/R heart cath when volume status improved -- Will get team to round early  tomorrow to dispo for cath (msg sent to inbox). Possibly ready tomorrow afternoon, OK to eat in AM per MD.  GDMT -- Continue Farxiga 10 mg daily  CAD -- Planing for L/R heart cath after stable -- Continue aspirin 81mg  daily  Persistent atrial fibrillation Chads2vasc: 5 -- Had Left atrial appendage occlusion/Watchman on 06/2022 by Lucas Dudley. -- Not currently on anticoagulation. -- Continue amiodarone 200mg  daily.  History of GI bleed, anemia -- regularly receives iv iron infusions from oncology -- follow-up anemia in AM  Essential HTN  Hyperlipidemia -- patient stopped Lipitor after having myalgias   CKD stage IIIb Cr today is 1.74. This appears to be close to baseline of 1.2-1.6  Other medical conditions managed per primary  For questions or updates, please contact Lucas Dudley Please consult [email or web address] for contact info under        Signed, Lucas Dudley   I have seen and examined the patient along with Lucas Dudley .  I have reviewed the chart, notes and new data.  I agree with PA/NP's note.  Key new complaints: He appears to still become short of breath simply changing position in bed and may have some residual orthopnea.  Edema in his legs is substantially  improved. Key examination changes: Has prominent jugular venous distention, and no edema in his legs, regular rate and rhythm, loud and widely split second heart sound, there is a 2/6 holosystolic murmur heard best in the mid precordial area not radiating towards the axilla.  No diastolic murmurs or gallops are heard. Key new findings / data: Reviewed his echocardiogram.  While he does have preserved left ventricular systolic function, the right ventricle is actually dilated and at least mildly-moderately depressed.  Markedly elevated systolic PA pressure over 60 mmHg.  His electrocardiogram consistently shows sinus rhythm with a very long first-degree AV block (although the automatic  interpretations from the computer identified the PR interval is either normal or very short, erroneously).  This makes evaluation of diastolic function parameters by echo very difficult due to E-A fusion. Lowest BNP recently was 111 on 12/17/2023. Minimally abnormal TropI, not c/w acute coronary insufficiency. 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  He has a >50 pack-year smoking history, quit for many years. 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  Nevertheless, he still appears to be mildly hypervolemic.  Continue the diuretic overnight and reassess readiness for R and L heart cath based on clinical exam and renal parameters early tomorrow morning.  Will make NPO after breakfast for possible cath in afternoon.  Understands that if CAD with critical stenosis is identified, this may need to be treated with staged PCI to protect his kidney function.  His daughter Lucas Dudley participated in this discussion by phone.  Informed Consent   Shared Decision Making/Informed Consent The risks [stroke (1 in 1000), death (1 in 1000), kidney failure [usually temporary] (1 in 500), bleeding (1 in 200), allergic reaction [possibly serious] (1 in 200)], benefits (diagnostic support and management of coronary artery disease) and alternatives of a cardiac catheterization were discussed in detail with Lucas Dudley and he is willing to proceed.      Lucas Fair, MD, [facility] [phone] 01/22/2024, 8:08 PM

## 2024-01-22 NOTE — Progress Notes (Signed)
 PROGRESS NOTE   Lucas NOAH  Dudley:[id number] DOB: [birth year] DOA: 01/13/2024 PCP: Anabel Halon, MD   Chief Complaint  Patient presents with   Low Oxygen   Level of care: Telemetry Cardiac  Brief Admission History:  83 y.o. male with medical history significant for systolic and diastolic CHF, prostate cancer, hypertension, paroxysmal atrial fibrillation, OSA, diabetes mellitus.  Patient was sent to the ED from heme-onc office with reports of hypoxia-sats down to 84 to 90% on room air.  She reports over the past few days he has had worsening dyspnea with exertion, which improves with rest.  Reports chronic unchanged cough.  Chronic bilateral lower extremity edema, and he denies abdominal bloating.[name]  He is unaware of any weight gain.  He reports compliance with his medications include torsemide 40 mg daily.    Recent hospitalized-3/1 to 3/4 for decompensated CHF, hypoxia.  Weight on discharge was 244.2 pounds.   He was recently seen in cardiology clinic 01/02/2024 with complaints of dyspnea on exertion and fatigue.  Per notes- patient with evidence of HFpEF, likely contributing, and RV dysfunction with evidence of potentially severe pulmonary hypertension by echo.  Also possible progression of his coronary artery disease which was documented 2016. He had been scheduled outpatient for cath. Now after being diuresed this admission cardiology team requested transfer to [facility] to plan for cath.   Assessment and Plan:  Acute hypoxic respiratory failure  -currently on 3L/min [place] oxygen supplementation -Continue diuresis per cardiology team recommendations -Follow daily weights, strict I's and O's and low-sodium diet -Wean oxygen as tolerated -encouraged use of flutter valve.   Acute on chronic combined systolic and diastolic CHF (congestive heart failure)  - dry Weight 109 kg/240.3 lbs. dry weight BNP of 111 -Dyspnea on exertion, hypoxia, chronic bilateral lower extremity swelling.  Recent discharge  with 244.2 Lbs, today he is weighing 241 pounds -continue IV diuresis -Reds clip measurement has been requested to be done daily -Low-sodium diet discussed with patient. Filed Weights   01/20/24 0500 01/21/24 0537 01/22/24 0616  Weight: 109.5 kg 109.2 kg 109.5 kg    Intake/Output Summary (Last 24 hours) at 01/22/2024 1444 Last data filed at 01/22/2024 1300 Gross per 24 hour  Intake 780 ml  Output 1700 ml  Net -920 ml   CAD in native artery Denies chest pain at this time.  Troponin 31 > 34.  EKG unchanged.   - Recent Cardiology notes for DOE- 01/02/2024- patient with evidence of HFpEF, likely contributing, and RV dysfunction with evidence of potentially severe pulmonary hypertension by echo.  Also possible progression of his coronary artery disease which was documented 2016, he was scheduled for cardiac cath tomorrow- 3/31. - EDP talked with cardiology on-call, admit here, cardiac cath will be re-scheduled. -Patient denies chest pain -Continue telemetry monitoring -Continue treatment with aspirin. -discussed with cardiology team and recommendation is to transfer to [facility] to pursue inpatient cardiac catheterization    Essential hypertension-stable -Holding hydralazine in order to provide room for diuresis -Blood pressure currently controlled with Lasix twice a day -Heart healthy/low-sodium diet discussed with patient -Continue to follow vital signs fluctuation.   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  Follows with cardiology. -Status post watchman's device.  He is not on anticoagulation -Continue telemetry monitoring -Continue to follow ultralights and replete as needed; goal is for potassium above 4 and magnesium above 2   Class II obesity -Body mass index is 35.43 kg/m. -Low-calorie diet, portion control and increase physical activity discussed with patient.  DVT prophylaxis: enoxaparin Code Status: Full  Family Communication:  Disposition: transfer patient to [facility] to pursue cath    Consultants:  Cardiology >>  Procedures:   Antimicrobials:    Subjective: Pt reports feeling better sitting up in chair, he continues to urinate frequently on current regimen of IV furosemide.   Objective: Vitals:   01/21/24 2000 01/22/24 0616 01/22/24 0908 01/22/24 1320  BP:  (!) 121/55 111/89 135/71  Pulse:  (!) 57 (!) 59 (!) 54  Resp:  19  16  Temp:  99 F (37.2 C) 98.4 F (36.9 C) 97.9 F (36.6 C)  TempSrc:  Oral Oral   SpO2: 95% 100% 100% 90%  Weight:  109.5 kg    Height:        Intake/Output Summary (Last 24 hours) at 01/22/2024 1442 Last data filed at 01/22/2024 1300 Gross per 24 hour  Intake 780 ml  Output 1700 ml  Net -920 ml   Filed Weights   01/20/24 0500 01/21/24 0537 01/22/24 0616  Weight: 109.5 kg 109.2 kg 109.5 kg   Examination:  General exam: Appears calm and comfortable  Respiratory system: Clear to auscultation. Respiratory effort normal. Cardiovascular system: normal S1 & S2 heard. No JVD, murmurs, rubs, gallops or clicks. 2+ pedal edema. Gastrointestinal system: Abdomen is nondistended, soft and nontender. No organomegaly or masses felt. Normal bowel sounds heard. Central nervous system: Alert and oriented. No focal neurological deficits. Extremities: BLE edema, dependent edema in ankles. Symmetric 5 x 5 power. Skin: chronic venous stasis changes BLEs.  No lesions or ulcers. Psychiatry:  Judgement and insight appear normal. Mood & affect appropriate.   Data Reviewed: I have personally reviewed following labs and imaging studies  CBC: Recent Labs  Lab 01/17/24 0503 01/18/24 0527 01/19/24 0414 01/20/24 1154 01/21/24 0446 01/22/24 0539  WBC 9.9 9.1 8.3  --  8.0 9.6  HGB 8.7* 8.3* 8.0* 8.8* 8.6* 9.3*  HCT 28.2* 27.6* 26.3* 28.3* 28.5* 31.2*  MCV 95.9 96.2 96.7  --  95.3 95.4  PLT 278 296 336  --  412* 532*    Basic Metabolic Panel: Recent Labs  Lab 01/18/24 0527 01/19/24 0414 01/20/24 0450 01/21/24 0446 01/22/24 0539  NA 133* 137 137 137 135  K 3.5 3.4* 2.8* 3.4* 3.5  CL 91* 90* 85* 85* 85*  CO2 32 35* 37* 41* 37*  GLUCOSE 227* 252* 193* 145* 170*  BUN 29* 30* 32* 32* 35*  CREATININE 1.45* 1.60* 1.62* 1.66* 1.74*  CALCIUM 9.1 9.3 9.6 9.3 9.7  MG 1.8 1.8 1.8 1.8 1.9    CBG: Recent Labs  Lab 01/21/24 1116 01/21/24 1636 01/21/24 2103 01/22/24 0711 01/22/24 1112  GLUCAP 303* 232* 156* 173* 294*    No results found for this or any previous visit (from the past 240 hours).   Radiology Studies: No results found.  Scheduled Meds:  amiodarone  200 mg Oral QHS   aspirin  81 mg Oral Daily  dapagliflozin propanediol  10 mg Oral Daily   docusate sodium  100 mg Oral BID   enoxaparin (LOVENOX) injection  60 mg Subcutaneous Q24H   insulin aspart  0-15 Units Subcutaneous TID WC   insulin aspart  0-5 Units Subcutaneous QHS   insulin aspart  10 Units Subcutaneous TID WC   insulin glargine-yfgn  30 Units Subcutaneous QHS   pantoprazole  40 mg Oral Daily   polyethylene glycol  17 g Oral Daily   potassium chloride  40 mEq Oral BID   Continuous Infusions:  furosemide (LASIX) 200 mg in dextrose 5 % 100 mL (2 mg/mL) infusion      LOS: 9 days   Time spent: 51 mins  Rebekah Sprinkle Laural Benes, MD How to contact the [facility] Attending or Consulting provider 7A - 7P or covering provider during after hours 7P -7A, for this patient?  Check the care team in [facility] and look  for a) attending/consulting TR[facility] provider listed and b) the [facility] team listed Log into [email or web address] to find provider on call.  Locate the [facility] provider you are looking for under [facility] and page to a number that you can be directly reached. If you still have difficulty reaching the provider, please page the [facility] (Director on Call) for the Hospitalists listed on amion for assistance.  01/22/2024, 2:42 PM

## 2024-01-22 NOTE — Hospital Course (Signed)
 83 y.o. male with medical history significant for systolic and diastolic CHF, prostate cancer, hypertension, paroxysmal atrial fibrillation, OSA, diabetes mellitus.  Patient was sent to the ED from heme-onc office with reports of hypoxia-sats down to 84 to 90% on room air.  She reports over the past few days he has had worsening dyspnea with exertion, which improves with rest.  Reports chronic unchanged cough.  Chronic bilateral lower extremity edema, and he denies abdominal bloating.[name]  He is unaware of any weight gain.  He reports compliance with his medications include torsemide 40 mg daily.    Recent hospitalized-3/1 to 3/4 for decompensated CHF, hypoxia.  Weight on discharge was 244.2 pounds.   He was recently seen in cardiology clinic 01/02/2024 with complaints of dyspnea on exertion and fatigue.  Per notes- patient with evidence of HFpEF, likely contributing, and RV dysfunction with evidence of potentially severe pulmonary hypertension by echo.  Also possible progression of his coronary artery disease which was documented 2016. He had been scheduled outpatient for cath. Now after being diuresed this admission cardiology team requested transfer to [facility] to plan for cath.

## 2024-01-22 NOTE — Inpatient Diabetes Management (Signed)
 Inpatient Diabetes Program Recommendations  AACE/ADA: New Consensus Statement on Inpatient Glycemic Control (2015)  Target Ranges:  Prepandial:   less than 140 mg/dL      Peak postprandial:   less than 180 mg/dL (1-2 hours)      Critically ill patients:  140 - 180 mg/dL    Latest Reference Range & Units 01/21/24 07:31 01/21/24 11:16 01/21/24 16:36 01/21/24 21:03  Glucose-Capillary 70 - 99 mg/dL 952 (H)  6 units Novolog  303 (H)  14 units Novolog @1259   232 (H)  8 units Novolog  156 (H)    27 units Semglee  (H): Data is abnormally high  Latest Reference Range & Units 01/22/24 07:11  Glucose-Capillary 70 - 99 mg/dL 841 (H)  6 units Novolog   (H): Data is abnormally high    Home DM Meds: Amaryl 2 mg BID Basaglar 38 units at bedtime Metformin 500 mg BID   Current Orders: Semglee 27 units at bedtime Novolog 3 units TID with meals Novolog Moderate Correction Scale/ SSI (0-15 units) TID AC + HS    MD- Please consider increasing the Novolog Meal Coverage to 6 units TID with meals    --Will follow patient during hospitalization--  Ambrose Finland RN, MSN, CDCES Diabetes Coordinator Inpatient Glycemic Control Team Team Pager: [phone] (8a-5p)

## 2024-01-23 DIAGNOSIS — I5031 Acute diastolic (congestive) heart failure: Secondary | ICD-10-CM

## 2024-01-23 DIAGNOSIS — I5189 Other ill-defined heart diseases: Secondary | ICD-10-CM

## 2024-01-23 DIAGNOSIS — I2510 Atherosclerotic heart disease of native coronary artery without angina pectoris: Secondary | ICD-10-CM | POA: Diagnosis not present

## 2024-01-23 DIAGNOSIS — D509 Iron deficiency anemia, unspecified: Secondary | ICD-10-CM

## 2024-01-23 DIAGNOSIS — N179 Acute kidney failure, unspecified: Secondary | ICD-10-CM

## 2024-01-23 DIAGNOSIS — J9601 Acute respiratory failure with hypoxia: Secondary | ICD-10-CM | POA: Diagnosis not present

## 2024-01-23 DIAGNOSIS — I272 Pulmonary hypertension, unspecified: Secondary | ICD-10-CM | POA: Diagnosis not present

## 2024-01-23 DIAGNOSIS — E1165 Type 2 diabetes mellitus with hyperglycemia: Secondary | ICD-10-CM

## 2024-01-23 DIAGNOSIS — Z794 Long term (current) use of insulin: Secondary | ICD-10-CM

## 2024-01-23 LAB — CBC
HCT: 29.4 % — ABNORMAL LOW (ref 39.0–52.0)
Hemoglobin: 9.2 g/dL — ABNORMAL LOW (ref 13.0–17.0)
MCH: 29.1 pg (ref 26.0–34.0)
MCHC: 31.3 g/dL (ref 30.0–36.0)
MCV: 93 fL (ref 80.0–100.0)
Platelets: 542 10*3/uL — ABNORMAL HIGH (ref 150–400)
RBC: 3.16 MIL/uL — ABNORMAL LOW (ref 4.22–5.81)
RDW: 18.6 % — ABNORMAL HIGH (ref 11.5–15.5)
WBC: 9.3 10*3/uL (ref 4.0–10.5)
nRBC: 0 % (ref 0.0–0.2)

## 2024-01-23 LAB — BASIC METABOLIC PANEL WITH GFR
Anion gap: 14 (ref 5–15)
BUN: 38 mg/dL — ABNORMAL HIGH (ref 8–23)
CO2: 35 mmol/L — ABNORMAL HIGH (ref 22–32)
Calcium: 9.5 mg/dL (ref 8.9–10.3)
Chloride: 86 mmol/L — ABNORMAL LOW (ref 98–111)
Creatinine, Ser: 1.88 mg/dL — ABNORMAL HIGH (ref 0.61–1.24)
GFR, Estimated: 35 mL/min — ABNORMAL LOW (ref >60)
Glucose, Bld: 198 mg/dL — ABNORMAL HIGH (ref 70–99)
Potassium: 3.4 mmol/L — ABNORMAL LOW (ref 3.5–5.1)
Sodium: 135 mmol/L (ref 135–145)

## 2024-01-23 LAB — GLUCOSE, CAPILLARY
Glucose-Capillary: 171 mg/dL — ABNORMAL HIGH (ref 70–99)
Glucose-Capillary: 229 mg/dL — ABNORMAL HIGH (ref 70–99)
Glucose-Capillary: 166 mg/dL — ABNORMAL HIGH (ref 70–99)
Glucose-Capillary: 132 mg/dL — ABNORMAL HIGH (ref 70–99)

## 2024-01-23 LAB — MAGNESIUM: Magnesium: 1.9 mg/dL (ref 1.7–2.4)

## 2024-01-23 MED ORDER — SODIUM CHLORIDE 0.9 % IV SOLN: INTRAVENOUS | Status: AC

## 2024-01-23 MED ORDER — ENOXAPARIN SODIUM 40 MG/0.4ML IJ SOSY
40.0000 mg | PREFILLED_SYRINGE | INTRAMUSCULAR | Status: DC
  Administered 2024-01-24 – 2024-01-28 (×5): 40 mg via SUBCUTANEOUS
  Filled 2024-01-23 (×5): qty 0.4

## 2024-01-23 MED ORDER — ROSUVASTATIN CALCIUM 20 MG PO TABS
40.0000 mg | ORAL_TABLET | Freq: Every day | ORAL | Status: DC
  Administered 2024-01-23 – 2024-01-28 (×6): 40 mg via ORAL
  Filled 2024-01-23 (×6): qty 2

## 2024-01-23 NOTE — Progress Notes (Addendum)
 PROGRESS NOTE    CLEVLAND CORK  ZOX:[id number] DOB: 1941-09-14 DOA: 01/13/2024 PCP: Anabel Halon, MD  83/M with diastolic CHF, CAD, persistent A-fib with Watchman device, OSA, type 2 diabetes mellitus, iron deficiency anemia presented to the ED with progressive dyspnea on exertion, hypoxia from heme-onc clinic.  Labs noted creatinine of 1.59, hemoglobin 8.8, BNP 327, troponin 31, 34, chest x-ray with left lung atelectasis versus infiltrate, EKG noted A-fib -Admitted at [facility], started on diuretics   Subjective: -Feels better overall, breathing is improving, not back to baseline, plan to have a cath today  Assessment and Plan:  Acute hypoxic respiratory failure (HCC) -In the setting of CHF -Also suspect some underlying COPD, former heavy smoker with 54-pack-year smoking history, quit about 30 years ago, would benefit from PFTs -Attempt to wean O2 as tolerated  Acute on chronic combined systolic and diastolic CHF  Pulmonary hypertension, RV dysfunction -Echo 3/25 noted EF 55-60%, indeterminate diastolic function, mildly reduced RV, severe pulmonary hypertension, PASP 63 -Admitted with volume overload, diuresed on Lasix drip, down 16 LB, 14.8 L negative, continue Farxiga -Suspect underlying COPD/OSA contributing to [facility] -Cards following, plan for right and left heart cath today -GDMT limited by AKI/CKD  Coronary artery disease  -Continue aspirin, plan for L/RHC today  OSA -Continue nightly CPAP  Persistent atrial fibrillation -Had left atrial appendage occlusion/watchman's device per Dr. Excell Seltzer 9/23 -Off anticoagulation, continue amiodarone  History of iron deficiency anemia, GI bleeds -Followed by oncology receives periodic iron infusions -Hemoglobin relatively stable, monitor  Type 2 diabetes mellitus with other specified complication (HCC) A1c 8.3. - SSi- M -Increase insulin, glipizide and metformin on hold  CKD 3b -Baseline creatinine around 1.2-1.6,  creatinine slightly higher at 1.8 now, monitor   DVT prophylaxis: Lovenox Code Status: Full code Family Communication: None present Disposition Plan: Home pending above workup  Consultants: Cards   Objective: Vitals:   01/23/24 0502 01/23/24 0503 01/23/24 0509 01/23/24 0810  BP: (!) 133/99   (!) 123/51  Pulse:    (!) 56  Resp: 18 18  19   Temp: 97.7 F (36.5 C) 97.7 F (36.5 C)  98 F (36.7 C)  TempSrc: Oral Oral  Oral  SpO2:    93%  Weight:   106.3 kg   Height:        Intake/Output Summary (Last 24 hours) at 01/23/2024 1012 Last data filed at 01/23/2024 8119 Gross per 24 hour  Intake 703.03 ml  Output 2750 ml  Net -2046.97 ml   Filed Weights   01/21/24 0537 01/22/24 0616 01/23/24 0509  Weight: 109.2 kg 109.5 kg 106.3 kg    Examination:  General exam: Chronically ill male sitting up in the recliner HEENT: Positive JVD CVS: S1-S2, regular rhythm Lungs: Poor air movement bilaterally Abdomen: Soft, nontender, bowel sounds present Extremities: 1+ edema Skin: No rashes Psychiatry:  Mood & affect appropriate.     Data Reviewed:   CBC: Recent Labs  Lab 01/18/24 0527 01/19/24 0414 01/20/24 1154 01/21/24 0446 01/22/24 0539 01/23/24 0233  WBC 9.1 8.3  --  8.0 9.6 9.3  HGB 8.3* 8.0* 8.8* 8.6* 9.3* 9.2*  HCT 27.6* 26.3* 28.3* 28.5* 31.2* 29.4*  MCV 96.2 96.7  --  95.3 95.4 93.0  PLT 296 336  --  412* 532* 542*   Basic Metabolic Panel: Recent Labs  Lab 01/19/24 0414 01/20/24 0450 01/21/24 0446 01/22/24 0539 01/23/24 0233  NA 137 137 137 135 135  K 3.4* 2.8* 3.4* 3.5 3.4*  CL 90* 85* 85* 85* 86*  CO2 35* 37* 41* 37* 35*  GLUCOSE 252* 193* 145* 170* 198*  BUN 30* 32* 32* 35* 38*  CREATININE 1.60* 1.62* 1.66* 1.74* 1.88*  CALCIUM 9.3 9.6 9.3 9.7 9.5  MG 1.8 1.8 1.8 1.9 1.9   GFR: Estimated Creatinine Clearance: 37.6 mL/min (A) (by C-G formula based on SCr of 1.88 mg/dL (H)). Liver Function Tests: No results for input(s): "AST", "ALT", "ALKPHOS",  "BILITOT", "PROT", "ALBUMIN" in the last 168 hours. No results for input(s): "LIPASE", "AMYLASE" in the last 168 hours. No results for input(s): "AMMONIA" in the last 168 hours. Coagulation Profile: No results for input(s): "INR", "PROTIME" in the last 168 hours. Cardiac Enzymes: No results for input(s): "CKTOTAL", "CKMB", "CKMBINDEX", "TROPONINI" in the last 168 hours. BNP (last 3 results) No results for input(s): "PROBNP" in the last 8760 hours. HbA1C: No results for input(s): "HGBA1C" in the last 72 hours. CBG: Recent Labs  Lab 01/22/24 0711 01/22/24 1112 01/22/24 1628 01/22/24 2131 01/23/24 0600  GLUCAP 173* 294* 228* 157* 171*   Lipid Profile: No results for input(s): "CHOL", "HDL", "LDLCALC", "TRIG", "CHOLHDL", "LDLDIRECT" in the last 72 hours. Thyroid Function Tests: No results for input(s): "TSH", "T4TOTAL", "FREET4", "T3FREE", "THYROIDAB" in the last 72 hours. Anemia Panel: No results for input(s): "VITAMINB12", "FOLATE", "FERRITIN", "TIBC", "IRON", "RETICCTPCT" in the last 72 hours. Urine analysis:    Component Value Date/Time   COLORURINE YELLOW 10/18/2018 1525   APPEARANCEUR CLEAR 10/18/2018 1525   LABSPEC 1.012 10/18/2018 1525   PHURINE 5.0 10/18/2018 1525   GLUCOSEU 50 (A) 10/18/2018 1525   HGBUR MODERATE (A) 10/18/2018 1525   BILIRUBINUR NEGATIVE 10/18/2018 1525   KETONESUR NEGATIVE 10/18/2018 1525   PROTEINUR 100 (A) 10/18/2018 1525   NITRITE NEGATIVE 10/18/2018 1525   LEUKOCYTESUR NEGATIVE 10/18/2018 1525   Sepsis Labs: @LABRCNTIP (procalcitonin:4,lacticidven:4)  )No results found for this or any previous visit (from the past 240 hours).   Radiology Studies: No results found.   Scheduled Meds:  amiodarone  200 mg Oral QHS   aspirin  81 mg Oral Daily   dapagliflozin propanediol  10 mg Oral Daily   docusate sodium  100 mg Oral BID   [START ON 01/24/2024] enoxaparin (LOVENOX) injection  40 mg Subcutaneous Q24H   insulin aspart  0-15 Units  Subcutaneous TID WC   insulin aspart  0-5 Units Subcutaneous QHS   insulin aspart  10 Units Subcutaneous TID WC   insulin glargine-yfgn  30 Units Subcutaneous QHS   pantoprazole  40 mg Oral Daily   polyethylene glycol  17 g Oral Daily   potassium chloride  40 mEq Oral BID   Continuous Infusions:  furosemide (LASIX) 200 mg in dextrose 5 % 100 mL (2 mg/mL) infusion 8 mg/hr (01/23/24 0143)     LOS: 10 days    Time spent:53min    Zannie Cove, MD [facility]   01/23/2024, 10:12 AM

## 2024-01-23 NOTE — Plan of Care (Signed)
  Problem: Education: Goal: Knowledge of General Education information will improve Description: Including pain rating scale, medication(s)/side effects and non-pharmacologic comfort measures Outcome: Progressing   Problem: Health Behavior/Discharge Planning: Goal: Ability to manage health-related needs will improve Outcome: Progressing   Problem: Clinical Measurements: Goal: Ability to maintain clinical measurements within normal limits will improve Outcome: Progressing Goal: Will remain free from infection Outcome: Progressing Goal: Diagnostic test results will improve Outcome: Progressing Goal: Respiratory complications will improve Outcome: Progressing Goal: Cardiovascular complication will be avoided Outcome: Progressing   Problem: Activity: Goal: Risk for activity intolerance will decrease Outcome: Progressing   Problem: Nutrition: Goal: Adequate nutrition will be maintained Outcome: Progressing   Problem: Coping: Goal: Level of anxiety will decrease Outcome: Progressing   Problem: Elimination: Goal: Will not experience complications related to bowel motility Outcome: Progressing Goal: Will not experience complications related to urinary retention Outcome: Progressing   Problem: Pain Managment: Goal: General experience of comfort will improve and/or be controlled Outcome: Progressing   Problem: Safety: Goal: Ability to remain free from injury will improve Outcome: Progressing   Problem: Skin Integrity: Goal: Risk for impaired skin integrity will decrease Outcome: Progressing   Problem: Education: Goal: Ability to describe self-care measures that may prevent or decrease complications (Diabetes Survival Skills Education) will improve Outcome: Progressing Goal: Individualized Educational Video(s) Outcome: Progressing   Problem: Coping: Goal: Ability to adjust to condition or change in health will improve Outcome: Progressing   Problem: Fluid  Volume: Goal: Ability to maintain a balanced intake and output will improve Outcome: Progressing   Problem: Health Behavior/Discharge Planning: Goal: Ability to identify and utilize available resources and services will improve Outcome: Progressing Goal: Ability to manage health-related needs will improve Outcome: Progressing   Problem: Metabolic: Goal: Ability to maintain appropriate glucose levels will improve Outcome: Progressing   Problem: Nutritional: Goal: Maintenance of adequate nutrition will improve Outcome: Progressing Goal: Progress toward achieving an optimal weight will improve Outcome: Progressing   Problem: Skin Integrity: Goal: Risk for impaired skin integrity will decrease Outcome: Progressing   Problem: Tissue Perfusion: Goal: Adequacy of tissue perfusion will improve Outcome: Progressing   Problem: Education: Goal: Understanding of CV disease, CV risk reduction, and recovery process will improve Outcome: Progressing Goal: Individualized Educational Video(s) Outcome: Progressing   Problem: Activity: Goal: Ability to return to baseline activity level will improve Outcome: Progressing   Problem: Cardiovascular: Goal: Ability to achieve and maintain adequate cardiovascular perfusion will improve Outcome: Progressing Goal: Vascular access site(s) Level 0-1 will be maintained Outcome: Progressing   Problem: Health Behavior/Discharge Planning: Goal: Ability to safely manage health-related needs after discharge will improve Outcome: Progressing   Problem: Education: Goal: Ability to demonstrate management of disease process will improve Outcome: Progressing Goal: Ability to verbalize understanding of medication therapies will improve Outcome: Progressing Goal: Individualized Educational Video(s) Outcome: Progressing   Problem: Activity: Goal: Capacity to carry out activities will improve Outcome: Progressing   Problem: Cardiac: Goal: Ability to  achieve and maintain adequate cardiopulmonary perfusion will improve Outcome: Progressing

## 2024-01-23 NOTE — Care Management Important Message (Signed)
 Important Message  Patient Details  Name: Lucas Dudley MRN: [id number] Date of Birth: 1941-02-22   Important Message Given:  Yes - Medicare IM     Renie Ora 01/23/2024, 11:17 AM

## 2024-01-23 NOTE — Progress Notes (Signed)
 Rounding Note    Patient Name: Lucas Dudley Date of Encounter: 01/23/2024  [facility] Cardiologist: Nona Dell, MD  Chief Complaint:  low oxygen  Reason of consult: CHF   Subjective   Patient seen and examined at bedside. Daughter at bedside as well. Denies anginal chest pain. Shortness of breath improving. Patient and daughter wondering when the heart catheterization will take place.  Inpatient Medications    Scheduled Meds:  amiodarone  200 mg Oral QHS   aspirin  81 mg Oral Daily   dapagliflozin propanediol  10 mg Oral Daily   docusate sodium  100 mg Oral BID   [START ON 01/24/2024] enoxaparin (LOVENOX) injection  40 mg Subcutaneous Q24H   insulin aspart  0-15 Units Subcutaneous TID WC   insulin aspart  0-5 Units Subcutaneous QHS   insulin aspart  10 Units Subcutaneous TID WC   insulin glargine-yfgn  30 Units Subcutaneous QHS   pantoprazole  40 mg Oral Daily   polyethylene glycol  17 g Oral Daily   potassium chloride  40 mEq Oral BID   rosuvastatin  40 mg Oral Daily   Continuous Infusions:  furosemide (LASIX) 200 mg in dextrose 5 % 100 mL (2 mg/mL) infusion 4 mg/hr (01/23/24 1214)   PRN Meds: acetaminophen, albuterol, diphenhydrAMINE, naphazoline-glycerin, traZODone   Vital Signs    Vitals:   01/23/24 0503 01/23/24 0509 01/23/24 0810 01/23/24 1100  BP:   (!) 123/51 (!) 122/53  Pulse:   (!) 56 (!) 55  Resp: 18  19 17   Temp: 97.7 F (36.5 C)  98 F (36.7 C) 98.4 F (36.9 C)  TempSrc: Oral  Oral Oral  SpO2:   93% 97%  Weight:  106.3 kg    Height:        Intake/Output Summary (Last 24 hours) at 01/23/2024 1511 Last data filed at 01/23/2024 1100 Gross per 24 hour  Intake 562.19 ml  Output 2450 ml  Net -1887.81 ml      01/23/2024    5:09 AM 01/22/2024    6:16 AM 01/21/2024    5:37 AM  Last 3 Weights  Weight (lbs) 234 lb 6.4 oz 241 lb 6.5 oz 240 lb 11.9 oz  Weight (kg) 106.323 kg 109.5 kg 109.2 kg      Telemetry    Sinus rhythm  without ectopy- Personally Reviewed  ECG    No new tracing- Personally Reviewed  Physical Exam   Physical Exam  Constitutional: No distress. He appears chronically ill.  hemodynamically stable  Neck: JVD present.  Cardiovascular: Normal rate, regular rhythm, S1 normal and S2 normal. Exam reveals no gallop, no S3 and no S4.  Murmur heard. Holosystolic murmur is present with a grade of 3/6 at the apex. Pulmonary/Chest: Effort normal. No stridor. He has no wheezes. He has no rales.  Decreased breath sounds bilaterally.  Abdominal: Soft. He exhibits no distension. There is no abdominal tenderness.  Musculoskeletal:        General: Edema (Bilateral feet.) present.     Cervical back: Neck supple.  Neurological: He is alert and oriented to person, place, and time.  Skin: Skin is warm.    Labs    High Sensitivity Troponin:   Recent Labs  Lab 01/13/24 1609 01/13/24 1739  TROPONINIHS 31* 34*     Chemistry Recent Labs  Lab 01/21/24 0446 01/22/24 0539 01/23/24 0233  NA 137 135 135  K 3.4* 3.5 3.4*  CL 85* 85* 86*  CO2 41* 37*  35*  GLUCOSE 145* 170* 198*  BUN 32* 35* 38*  CREATININE 1.66* 1.74* 1.88*  CALCIUM 9.3 9.7 9.5  MG 1.8 1.9 1.9  GFRNONAA 41* 39* 35*  ANIONGAP 11 13 14     Lipids No results for input(s): "CHOL", "TRIG", "HDL", "LABVLDL", "LDLCALC", "CHOLHDL" in the last 168 hours.  Hematology Recent Labs  Lab 01/21/24 0446 01/22/24 0539 01/23/24 0233  WBC 8.0 9.6 9.3  RBC 2.99* 3.27* 3.16*  HGB 8.6* 9.3* 9.2*  HCT 28.5* 31.2* 29.4*  MCV 95.3 95.4 93.0  MCH 28.8 28.4 29.1  MCHC 30.2 29.8* 31.3  RDW 18.4* 18.6* 18.6*  PLT 412* 532* 542*   Thyroid No results for input(s): "TSH", "FREET4" in the last 168 hours.  BNPNo results for input(s): "BNP", "PROBNP" in the last 168 hours.  DDimer No results for input(s): "DDIMER" in the last 168 hours.   Radiology    NA  Cardiac Studies   Echo 12/2023  1. Left ventricular ejection fraction, by estimation, is  55 to 60%. Left  ventricular ejection fraction by PLAX is 59 %. The left ventricle has  normal function. The left ventricle has no regional wall motion  abnormalities. There is moderate concentric  left ventricular hypertrophy. Left ventricular diastolic parameters are  indeterminate.   2. Right ventricular systolic function is mildly reduced. The right  ventricular size is normal. There is severely elevated pulmonary artery  systolic pressure. The estimated right ventricular systolic pressure is  62.6 mmHg.   3. The mitral valve is grossly normal. No evidence of mitral valve  regurgitation.   4. The aortic valve is tricuspid. Aortic valve regurgitation is not  visualized. Aortic valve sclerosis/calcification is present, without any  evidence of aortic stenosis.   5. The inferior vena cava is dilated in size with <50% respiratory  variability, suggesting right atrial pressure of 15 mmHg.   GI studies.   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  Small bowel nodule - junction distal duodenum/jejunum.  Irregular with a submucosal component, suspicious in appearance.  Pathology was benign. - Colonoscopy (05/24/2022): 5 mm polyp in the sigmoid colon, diverticulosis in the descending colon and at the splenic flexure.  Pathology - hyperplastic polyp. - EGD (05/24/2022): Normal esophagus, small hiatal hernia, friable gastric mucosa, couple of tiny gastric erosions, normal duodenal bulb and second part of duodenum. - Small bowel endoscopy by Dr. 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 - He was on Eliquis until September 2023, now s/p Watchman procedure.  Takes aspirin daily.  Patient Profile     83 y.o. male with a hx of  HFpEF, CAD with occluded diagonal and moderate disease of LAD and PDA, persistent afib with watchman, OSA, DM, iron deficient anemia who  was transferred for further diuresis and heart cath..   Assessment & Plan    Acute on chronic heart failure with preserved EF. RV dysfunction Improving. JVP still present on examination likely secondary to right ventricular dysfunction and elevated pulmonary pressures. Net IO Since Admission: -15,063.9 mL [01/23/24 1511] Transferred from [facility] for left and right heart catheterization. Renal function trending up likely secondary to aggressive diuresis during this hospitalization. Currently on Lasix drip at 8 mics per hour, will transition it to 4 mcg/hr Bilateral compression stockings to help mobilize fluids Ambulation encouraged. Strict I's and O's and daily weights. Continue Farxiga 10 mg p.o. daily. Currently not on ARB/Arni/ACE inhibitors/MRA  due to acute kidney injury. GDMT will need to be uptitrated once renal function improves.  Coronary artery disease without anginal pectoris. Denies true anginal discomfort but does have intermittent precordial pain EKG is nonischemic Continue aspirin 81 mg p.o. daily. Will start Crestor 40 mg p.o. nightly. A limited echocardiogram to reevaluate LVEF to further help risk stratify. Tentatively scheduled for left and right heart catheterization tomorrow 01/24/2024 as long as renal function improves.  Otherwise, we will need to be postponed to optimize renal function to decrease chances of CIN.  Revascularization would still be tricky given GI bleed history (would have concerns about utilizing dual antiplatelet therapy), recent hemoglobin has been stable.  Persistent atrial fibrillation status post Watchman implant. Rate control: N/A. Rhythm control: Amiodarone. Thromboembolic prophylaxis: Status post watchman implant  Pulmonary hypertension, prior echocardiogram Prior echocardiogram illustrates normal right ventricular size, reduction in RV function, and moderate to severe pulmonary hypertension per RVSP. Suspect he has WHO group  2 and 3 disease-significant history of smoking, underlying COPD, and OSA/obesity. Encouraged incentive spirometer.  Acute kidney injury (baseline creatinine around 1.4 mg/dL) Has diuresed very well since hospitalization. Will reduce Lasix drip as discussed above. Monitor BUN and creatinine. Avoid nephrotoxic agents.  Obstructive sleep apnea. Reemphasized importance of device therapy.  Type II diabetes mellitus. Last A1c 8.5. Reemphasized importance of glycemic control.   Iron deficiency anemia & Thrombocytosis: Receives IV iron therapy. Has undergone EGD, colonoscopy, and small bowel endoscopy in March 2025 results mentioned above  As part of today's progress note reviewed the last progress note from 01/22/2024, echocardiogram from March 2025, endoscopy results of EGD, colonoscopy, and small bowel enteroscopy, labs 01/23/2024, EKGs.  Plan of care discussed with patient, daughter, and nursing staff during rounds.     For questions or updates, please contact [facility] Please consult [email or web address] for contact info under       Signed, Tessa Lerner, DO, [facility]  [facility]  [address] [place], [place] [address] Pager: [phone] Office: [phone] 01/23/2024, 3:11 PM

## 2024-01-24 ENCOUNTER — Other Ambulatory Visit (HOSPITAL_COMMUNITY): Payer: Self-pay

## 2024-01-24 ENCOUNTER — Telehealth (HOSPITAL_COMMUNITY): Payer: Self-pay | Admitting: Pharmacy Technician

## 2024-01-24 DIAGNOSIS — I272 Pulmonary hypertension, unspecified: Secondary | ICD-10-CM

## 2024-01-24 DIAGNOSIS — G4733 Obstructive sleep apnea (adult) (pediatric): Secondary | ICD-10-CM

## 2024-01-24 DIAGNOSIS — E1165 Type 2 diabetes mellitus with hyperglycemia: Secondary | ICD-10-CM

## 2024-01-24 DIAGNOSIS — I5189 Other ill-defined heart diseases: Secondary | ICD-10-CM | POA: Diagnosis not present

## 2024-01-24 DIAGNOSIS — Z95818 Presence of other cardiac implants and grafts: Secondary | ICD-10-CM

## 2024-01-24 DIAGNOSIS — N179 Acute kidney failure, unspecified: Secondary | ICD-10-CM

## 2024-01-24 DIAGNOSIS — D509 Iron deficiency anemia, unspecified: Secondary | ICD-10-CM

## 2024-01-24 DIAGNOSIS — I2510 Atherosclerotic heart disease of native coronary artery without angina pectoris: Secondary | ICD-10-CM | POA: Diagnosis not present

## 2024-01-24 DIAGNOSIS — I4819 Other persistent atrial fibrillation: Secondary | ICD-10-CM

## 2024-01-24 DIAGNOSIS — I5031 Acute diastolic (congestive) heart failure: Secondary | ICD-10-CM

## 2024-01-24 LAB — BASIC METABOLIC PANEL WITH GFR
Anion gap: 13 (ref 5–15)
BUN: 36 mg/dL — ABNORMAL HIGH (ref 8–23)
CO2: 36 mmol/L — ABNORMAL HIGH (ref 22–32)
Calcium: 9.2 mg/dL (ref 8.9–10.3)
Chloride: 86 mmol/L — ABNORMAL LOW (ref 98–111)
Creatinine, Ser: 1.76 mg/dL — ABNORMAL HIGH (ref 0.61–1.24)
GFR, Estimated: 38 mL/min — ABNORMAL LOW (ref >60)
Glucose, Bld: 147 mg/dL — ABNORMAL HIGH (ref 70–99)
Potassium: 3.5 mmol/L (ref 3.5–5.1)
Sodium: 135 mmol/L (ref 135–145)

## 2024-01-24 LAB — CBC
HCT: 28 % — ABNORMAL LOW (ref 39.0–52.0)
Hemoglobin: 8.8 g/dL — ABNORMAL LOW (ref 13.0–17.0)
MCH: 28.9 pg (ref 26.0–34.0)
MCHC: 31.4 g/dL (ref 30.0–36.0)
MCV: 91.8 fL (ref 80.0–100.0)
Platelets: 538 10*3/uL — ABNORMAL HIGH (ref 150–400)
RBC: 3.05 MIL/uL — ABNORMAL LOW (ref 4.22–5.81)
RDW: 18.4 % — ABNORMAL HIGH (ref 11.5–15.5)
WBC: 8.7 10*3/uL (ref 4.0–10.5)
nRBC: 0 % (ref 0.0–0.2)

## 2024-01-24 LAB — GLUCOSE, CAPILLARY
Glucose-Capillary: 155 mg/dL — ABNORMAL HIGH (ref 70–99)
Glucose-Capillary: 140 mg/dL — ABNORMAL HIGH (ref 70–99)
Glucose-Capillary: 77 mg/dL (ref 70–99)
Glucose-Capillary: 270 mg/dL — ABNORMAL HIGH (ref 70–99)

## 2024-01-24 LAB — LDL CHOLESTEROL, DIRECT: Direct LDL: 83 mg/dL (ref 0–99)

## 2024-01-24 LAB — LIPID PANEL
Cholesterol: 133 mg/dL (ref 0–200)
HDL: 26 mg/dL — ABNORMAL LOW (ref >40)
LDL Cholesterol: 89 mg/dL (ref 0–99)
Total CHOL/HDL Ratio: 5.1 ratio
Triglycerides: 91 mg/dL (ref <150)
VLDL: 18 mg/dL (ref 0–40)

## 2024-01-24 MED ORDER — COLCHICINE 0.6 MG PO TABS
1.2000 mg | ORAL_TABLET | Freq: Once | ORAL | Status: AC
  Administered 2024-01-24: 1.2 mg via ORAL
  Filled 2024-01-24: qty 2

## 2024-01-24 MED ORDER — TIMOLOL MALEATE 0.5 % OP SOLN
1.0000 [drp] | Freq: Two times a day (BID) | OPHTHALMIC | Status: DC
  Administered 2024-01-24 – 2024-01-28 (×9): 1 [drp] via OPHTHALMIC
  Filled 2024-01-24: qty 5

## 2024-01-24 MED ORDER — BRIMONIDINE TARTRATE 0.2 % OP SOLN
1.0000 [drp] | Freq: Two times a day (BID) | OPHTHALMIC | Status: DC
Start: 2024-01-24 — End: 2024-01-28
  Administered 2024-01-24 – 2024-01-28 (×9): 1 [drp] via OPHTHALMIC
  Filled 2024-01-24: qty 5

## 2024-01-24 NOTE — Telephone Encounter (Signed)
 Patient Product/process development scientist completed.    The patient is insured through [employer]. Patient has Medicare and is not eligible for a copay card, but may be able to apply for patient assistance or Medicare RX Payment Plan (Patient Must reach out to their plan, if eligible for payment plan), if available.    Ran test claim for Jardiance 10 mg and the current 30 day co-pay is $30.00.   This test claim was processed through [facility]- copay amounts may vary at other pharmacies due to pharmacy/plan contracts, or as the patient moves through the different stages of their insurance plan.     Lucas Dudley, CPHT Pharmacy Technician III Certified Patient Advocate [facility] Patient Advocate Team Direct Number: [phone]  Fax: [phone]

## 2024-01-24 NOTE — Progress Notes (Signed)
 PROGRESS NOTE    Lucas Dudley  UJW:[id number] DOB: December 16, 1940 DOA: 01/13/2024 PCP: Anabel Halon, MD  83/M with diastolic CHF, CAD, persistent A-fib with Watchman device, recurrent GI bleed, OSA, type 2 diabetes mellitus, iron deficiency anemia presented to the ED with progressive dyspnea on exertion, hypoxia from heme-onc clinic.  Labs noted creatinine of 1.59, hemoglobin 8.8, BNP 327, troponin 31, 34, chest x-ray with left lung atelectasis versus infiltrate, EKG noted A-fib -Admitted at [facility], started on diuretics   Subjective: -Continues to feel better overall, n.p.o. for cath today  Assessment and Plan:  Acute hypoxic respiratory failure (HCC) -In the setting of CHF -Also suspect some underlying COPD, former heavy smoker with 54-pack-year smoking history, quit about 30 years ago, would benefit from PFTs -Continue to wean O2 as tolerated  Acute on chronic combined systolic and diastolic CHF  Pulmonary hypertension, RV dysfunction -Echo 3/25 noted EF 55-60%, indeterminate diastolic function, mildly reduced RV, severe pulmonary hypertension, PASP 63 -Admitted with volume overload, diuresed on Lasix drip, down 18 LB, 16.5 negative, continue Farxiga -Suspect underlying COPD/OSA contributing to [facility] -Cards following, plan for right and left heart cath on Monday -GDMT limited by AKI/CKD  Coronary artery disease  -Continue aspirin, plan for [facility] on Monday  OSA -Continue nightly CPAP  Persistent atrial fibrillation -Had left atrial appendage occlusion/watchman's device per Dr. Excell Seltzer 9/23 -Off anticoagulation, continue amiodarone  History of iron deficiency anemia, GI bleeds -Followed by oncology receives periodic iron infusions -Hemoglobin relatively stable, monitor  Type 2 diabetes mellitus with other specified complication (HCC) A1c 8.3. - SSi- M -Increase insulin, glipizide and metformin on hold  CKD 3b -Baseline creatinine around 1.2-1.6, creatinine  slightly higher at 1.8 now, monitor   DVT prophylaxis: Lovenox Code Status: Full code Family Communication: None present Disposition Plan: Home pending above workup  Consultants: Cards   Objective: Vitals:   01/24/24 0600 01/24/24 0700 01/24/24 0805 01/24/24 1104  BP:  130/62  (!) 150/75  Pulse:  (!) 57    Resp:  16    Temp:  98.1 F (36.7 C)  98.1 F (36.7 C)  TempSrc:  Oral  Oral  SpO2:  95% 96%   Weight: 106.2 kg     Height:        Intake/Output Summary (Last 24 hours) at 01/24/2024 1133 Last data filed at 01/24/2024 1040 Gross per 24 hour  Intake 364.46 ml  Output 1750 ml  Net -1385.54 ml   Filed Weights   01/22/24 0616 01/23/24 0509 01/24/24 0600  Weight: 109.5 kg 106.3 kg 106.2 kg    Examination:  General exam: Chronically ill male sitting up in the recliner, AAOx3 HEENT: Positive JVD CVS: S1-S2, regular rhythm Lungs: Decreased breath sounds at the bases Abdomen: Soft, nontender, bowel sounds present Extremities: 1+ edema Skin: No rashes Psychiatry:  Mood & affect appropriate.     Data Reviewed:   CBC: Recent Labs  Lab 01/19/24 0414 01/20/24 1154 01/21/24 0446 01/22/24 0539 01/23/24 0233 01/24/24 0312  WBC 8.3  --  8.0 9.6 9.3 8.7  HGB 8.0* 8.8* 8.6* 9.3* 9.2* 8.8*  HCT 26.3* 28.3* 28.5* 31.2* 29.4* 28.0*  MCV 96.7  --  95.3 95.4 93.0 91.8  PLT 336  --  412* 532* 542* 538*   Basic Metabolic Panel: Recent Labs  Lab 01/19/24 0414 01/20/24 0450 01/21/24 0446 01/22/24 0539 01/23/24 0233 01/24/24 0312  NA 137 137 137 135 135 135  K 3.4* 2.8* 3.4* 3.5 3.4* 3.5  CL 90* 85* 85* 85* 86* 86*  CO2 35* 37* 41* 37* 35* 36*  GLUCOSE 252* 193* 145* 170* 198* 147*  BUN 30* 32* 32* 35* 38* 36*  CREATININE 1.60* 1.62* 1.66* 1.74* 1.88* 1.76*  CALCIUM 9.3 9.6 9.3 9.7 9.5 9.2  MG 1.8 1.8 1.8 1.9 1.9  --    GFR: Estimated Creatinine Clearance: 40.2 mL/min (A) (by C-G formula based on SCr of 1.76 mg/dL (H)). Liver Function Tests: No results for  input(s): "AST", "ALT", "ALKPHOS", "BILITOT", "PROT", "ALBUMIN" in the last 168 hours. No results for input(s): "LIPASE", "AMYLASE" in the last 168 hours. No results for input(s): "AMMONIA" in the last 168 hours. Coagulation Profile: No results for input(s): "INR", "PROTIME" in the last 168 hours. Cardiac Enzymes: No results for input(s): "CKTOTAL", "CKMB", "CKMBINDEX", "TROPONINI" in the last 168 hours. BNP (last 3 results) No results for input(s): "PROBNP" in the last 8760 hours. HbA1C: No results for input(s): "HGBA1C" in the last 72 hours. CBG: Recent Labs  Lab 01/23/24 1058 01/23/24 1632 01/23/24 2055 01/24/24 0614 01/24/24 1103  GLUCAP 229* 166* 132* 155* 140*   Lipid Profile: Recent Labs    01/24/24 0312  CHOL 133  HDL 26*  LDLCALC 89  TRIG 91  CHOLHDL 5.1  LDLDIRECT 83   Thyroid Function Tests: No results for input(s): "TSH", "T4TOTAL", "FREET4", "T3FREE", "THYROIDAB" in the last 72 hours. Anemia Panel: No results for input(s): "VITAMINB12", "FOLATE", "FERRITIN", "TIBC", "IRON", "RETICCTPCT" in the last 72 hours. Urine analysis:    Component Value Date/Time   COLORURINE YELLOW 10/18/2018 1525   APPEARANCEUR CLEAR 10/18/2018 1525   LABSPEC 1.012 10/18/2018 1525   PHURINE 5.0 10/18/2018 1525   GLUCOSEU 50 (A) 10/18/2018 1525   HGBUR MODERATE (A) 10/18/2018 1525   BILIRUBINUR NEGATIVE 10/18/2018 1525   KETONESUR NEGATIVE 10/18/2018 1525   PROTEINUR 100 (A) 10/18/2018 1525   NITRITE NEGATIVE 10/18/2018 1525   LEUKOCYTESUR NEGATIVE 10/18/2018 1525   Sepsis Labs: @LABRCNTIP (procalcitonin:4,lacticidven:4)  )No results found for this or any previous visit (from the past 240 hours).   Radiology Studies: No results found.   Scheduled Meds:  amiodarone  200 mg Oral QHS   aspirin  81 mg Oral Daily   brimonidine  1 drop Both Eyes BID   And   timolol  1 drop Both Eyes BID   dapagliflozin propanediol  10 mg Oral Daily   docusate sodium  100 mg Oral BID    enoxaparin (LOVENOX) injection  40 mg Subcutaneous Q24H   insulin aspart  0-15 Units Subcutaneous TID WC   insulin aspart  0-5 Units Subcutaneous QHS   insulin aspart  10 Units Subcutaneous TID WC   insulin glargine-yfgn  30 Units Subcutaneous QHS   pantoprazole  40 mg Oral Daily   polyethylene glycol  17 g Oral Daily   potassium chloride  40 mEq Oral BID   rosuvastatin  40 mg Oral Daily   Continuous Infusions:  sodium chloride Stopped (01/24/24 0957)     LOS: 11 days    Time spent:    Zannie Cove, MD [facility]   01/24/2024, 11:33 AM

## 2024-01-24 NOTE — Progress Notes (Signed)
   01/23/24 2129  BiPAP/CPAP/SIPAP  BiPAP/CPAP/SIPAP Pt Type Adult  BiPAP/CPAP/SIPAP Resmed  Mask Type Nasal mask  Dentures removed? Not applicable  Respiratory Rate 18 breaths/min  Pressure Support 8 cmH20 (increased per PT comfort)  Flow Rate 5 lpm (5L O2 bleed in)  Patient Home Machine No  Patient Home Mask Yes  Patient Home Tubing Yes  CPAP/SIPAP surface wiped down Yes  Device Plugged into RED Power Outlet Yes  BiPAP/CPAP /SiPAP Vitals  Pulse Rate 63  Resp 18  SpO2 96 %  Bilateral Breath Sounds Diminished  MEWS Score/Color  MEWS Score 0  MEWS Score Color [place] Si

## 2024-01-24 NOTE — Plan of Care (Signed)
   Problem: Health Behavior/Discharge Planning: Goal: Ability to manage health-related needs will improve Outcome: Progressing   Problem: Activity: Goal: Risk for activity intolerance will decrease Outcome: Progressing

## 2024-01-24 NOTE — Progress Notes (Signed)
 Rounding Note    Patient Name: Lucas Dudley Date of Encounter: 01/24/2024  [facility] Cardiologist: Nona Dell, MD  Chief Complaint:  low oxygen  Reason of consult: CHF   Subjective   Patient seen and examined at bedside. No chest pain. Shortness of breath significantly improved as well as lower extremity swelling.  Inpatient Medications    Scheduled Meds:  amiodarone  200 mg Oral QHS   aspirin  81 mg Oral Daily   brimonidine  1 drop Both Eyes BID   And   timolol  1 drop Both Eyes BID   dapagliflozin propanediol  10 mg Oral Daily   docusate sodium  100 mg Oral BID   enoxaparin (LOVENOX) injection  40 mg Subcutaneous Q24H   insulin aspart  0-15 Units Subcutaneous TID WC   insulin aspart  0-5 Units Subcutaneous QHS   insulin aspart  10 Units Subcutaneous TID WC   insulin glargine-yfgn  30 Units Subcutaneous QHS   pantoprazole  40 mg Oral Daily   polyethylene glycol  17 g Oral Daily   potassium chloride  40 mEq Oral BID   rosuvastatin  40 mg Oral Daily   Continuous Infusions:  sodium chloride 10 mL/hr at 01/24/24 0655   PRN Meds: acetaminophen, albuterol, diphenhydrAMINE, naphazoline-glycerin, traZODone   Vital Signs    Vitals:   01/24/24 0103 01/24/24 0600 01/24/24 0700 01/24/24 0805  BP: (!) 138/54  130/62   Pulse: 60  (!) 57   Resp: 18  16   Temp: 97.9 F (36.6 C)  98.1 F (36.7 C)   TempSrc: Oral  Oral   SpO2: 92%   96%  Weight:  106.2 kg    Height:        Intake/Output Summary (Last 24 hours) at 01/24/2024 1033 Last data filed at 01/24/2024 0803 Gross per 24 hour  Intake 321.25 ml  Output 1750 ml  Net -1428.75 ml      01/24/2024    6:00 AM 01/23/2024    5:09 AM 01/22/2024    6:16 AM  Last 3 Weights  Weight (lbs) 234 lb 1.6 oz 234 lb 6.4 oz 241 lb 6.5 oz  Weight (kg) 106.187 kg 106.323 kg 109.5 kg      Telemetry    Sinus rhythm without ectopy- Personally Reviewed  ECG    No new tracing- Personally Reviewed  Physical  Exam   Physical Exam  Constitutional: No distress. He appears chronically ill.  hemodynamically stable  Neck: JVD present.  Cardiovascular: Normal rate, regular rhythm, S1 normal and S2 normal. Exam reveals no gallop, no S3 and no S4.  Murmur heard. Holosystolic murmur is present with a grade of 3/6 at the apex. Pulmonary/Chest: Effort normal. No stridor. He has no wheezes. He has no rales.  Decreased breath sounds bilaterally.  Abdominal: Soft. He exhibits no distension. There is no abdominal tenderness.  Musculoskeletal:        General: Edema (Bilateral feet.) present.     Cervical back: Neck supple.  Neurological: He is alert and oriented to person, place, and time.  Skin: Skin is warm.    Labs    High Sensitivity Troponin:   Recent Labs  Lab 01/13/24 1609 01/13/24 1739  TROPONINIHS 31* 34*     Chemistry Recent Labs  Lab 01/21/24 0446 01/22/24 0539 01/23/24 0233 01/24/24 0312  NA 137 135 135 135  K 3.4* 3.5 3.4* 3.5  CL 85* 85* 86* 86*  CO2 41* 37* 35* 36*  GLUCOSE 145* 170* 198* 147*  BUN 32* 35* 38* 36*  CREATININE 1.66* 1.74* 1.88* 1.76*  CALCIUM 9.3 9.7 9.5 9.2  MG 1.8 1.9 1.9  --   GFRNONAA 41* 39* 35* 38*  ANIONGAP 11 13 14 13     Lipids  Recent Labs  Lab 01/24/24 0312  CHOL 133  TRIG 91  HDL 26*  LDLCALC 89  CHOLHDL 5.1    Hematology Recent Labs  Lab 01/22/24 0539 01/23/24 0233 01/24/24 0312  WBC 9.6 9.3 8.7  RBC 3.27* 3.16* 3.05*  HGB 9.3* 9.2* 8.8*  HCT 31.2* 29.4* 28.0*  MCV 95.4 93.0 91.8  MCH 28.4 29.1 28.9  MCHC 29.8* 31.3 31.4  RDW 18.6* 18.6* 18.4*  PLT 532* 542* 538*   Thyroid No results for input(s): "TSH", "FREET4" in the last 168 hours.  BNPNo results for input(s): "BNP", "PROBNP" in the last 168 hours.  DDimer No results for input(s): "DDIMER" in the last 168 hours.   Radiology    NA  Cardiac Studies   Echo 12/2023  1. Left ventricular ejection fraction, by estimation, is 55 to 60%. Left  ventricular ejection  fraction by PLAX is 59 %. The left ventricle has  normal function. The left ventricle has no regional wall motion  abnormalities. There is moderate concentric  left ventricular hypertrophy. Left ventricular diastolic parameters are  indeterminate.   2. Right ventricular systolic function is mildly reduced. The right  ventricular size is normal. There is severely elevated pulmonary artery  systolic pressure. The estimated right ventricular systolic pressure is  62.6 mmHg.   3. The mitral valve is grossly normal. No evidence of mitral valve  regurgitation.   4. The aortic valve is tricuspid. Aortic valve regurgitation is not  visualized. Aortic valve sclerosis/calcification is present, without any  evidence of aortic stenosis.   5. The inferior vena cava is dilated in size with <50% respiratory  variability, suggesting right atrial pressure of 15 mmHg.   GI studies.   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  Small bowel nodule - junction distal duodenum/jejunum.  Irregular with a submucosal component, suspicious in appearance.  Pathology was benign. - Colonoscopy (05/24/2022): 5 mm polyp in the sigmoid colon, diverticulosis in the descending colon and at the splenic flexure.  Pathology - hyperplastic polyp. - EGD (05/24/2022): Normal esophagus, small hiatal hernia, friable gastric mucosa, couple of tiny gastric erosions, normal duodenal bulb and second part of duodenum. - Small bowel endoscopy by Dr. 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 - He was on Eliquis until September 2023, now s/p Watchman procedure.  Takes aspirin daily.  Patient Profile     83 y.o. 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   Assessment & Plan    Acute on chronic heart failure with preserved EF. RV dysfunction Improving. JVP still present likely secondary to right ventricular dysfunction and elevated pulmonary pressures. Net IO Since Admission: -16,712.65 mL [01/24/24 1033] Transferred from [facility] for left and right heart catheterization. AKI secondary to aggressive diuresis during this hospitalization. On 01/23/24 reducing lasix gtt from 8 mcg/hr to 4 mcg/hr.  Will d/c lasix gtt for now and restart po lasix once renal function improves / closer to d/c  Reschedule  LRHCP from today to Monday 01/27/2024 Bilateral compression stockings to help mobilize fluids Ambulation encouraged. Strict I's and O's and daily weights. Continue Farxiga 10 mg p.o. daily. Currently not on ARB/Arni/ACE inhibitors/MRA due to acute kidney injury. GDMT will need to be uptitrated once renal function stabilizes.  Coronary artery disease without anginal pectoris. Denies true anginal discomfort but does have intermittent precordial pain EKG is nonischemic Continue aspirin 81 mg p.o. daily. Continue Crestor 40 mg p.o. nightly - recommend rechecking Fast lipids and CMP after being on tx for 6 weeks.  Will change his LRHCP to Monday as the Cr has not significantly improved. Patient was not happy with postpone of his heart cath risk/benefits discussed. I also called his daughter to explain by we are delaying his [facility] and she understand and will speak to her father.    Revascularization would still be tricky given GI bleed history (would have concerns about utilizing dual antiplatelet therapy), recent hemoglobin has been stable.  Persistent atrial fibrillation status post Watchman implant. Rate control: N/A. Rhythm control: Amiodarone. Thromboembolic prophylaxis: Status post watchman implant Tele: NSR   Pulmonary hypertension, prior echocardiogram Prior echocardiogram illustrates normal right  ventricular size, reduction in RV function, and moderate to severe pulmonary hypertension per RVSP. Suspect he has WHO group 2 and 3 disease-significant history of smoking, underlying COPD, and OSA/obesity. Encouraged incentive spirometer.  Acute kidney injury (baseline creatinine around 1.4 mg/dL) Has diuresed very well since hospitalization. Net IO Since Admission: -16,712.65 mL [01/24/24 1033] Will d/c Lasix drip for now.  Monitor BUN and creatinine. Avoid nephrotoxic agents.  Obstructive sleep apnea. Reemphasized importance of device therapy.  Type II diabetes mellitus. Last A1c 8.5. Reemphasized importance of glycemic control.  Iron deficiency anemia & Thrombocytosis: Receives IV iron therapy. Has undergone EGD, colonoscopy, and small bowel endoscopy in March 2025 results mentioned above  As part of today's progress note reviewed attending progress note from 01/23/2024, labs 01/24/2024, Tele.  Plan of care discussed with patient, daughter, and nursing staff during rounds.     For questions or updates, please contact [facility] Please consult [email or web address] for contact info under       Signed, Tessa Lerner, DO, [facility]  [facility]  [address] [place], [place] [address] Pager: [phone] Office: [phone] 01/24/2024, 10:33 AM

## 2024-01-24 NOTE — Plan of Care (Signed)
  Problem: Education: Goal: Knowledge of General Education information will improve Description: Including pain rating scale, medication(s)/side effects and non-pharmacologic comfort measures Outcome: Progressing   Problem: Health Behavior/Discharge Planning: Goal: Ability to manage health-related needs will improve Outcome: Progressing   Problem: Clinical Measurements: Goal: Ability to maintain clinical measurements within normal limits will improve Outcome: Progressing Goal: Will remain free from infection Outcome: Progressing Goal: Diagnostic test results will improve Outcome: Progressing Goal: Respiratory complications will improve Outcome: Progressing Goal: Cardiovascular complication will be avoided Outcome: Progressing   Problem: Activity: Goal: Risk for activity intolerance will decrease Outcome: Progressing   Problem: Nutrition: Goal: Adequate nutrition will be maintained Outcome: Progressing   Problem: Coping: Goal: Level of anxiety will decrease Outcome: Progressing   Problem: Elimination: Goal: Will not experience complications related to bowel motility Outcome: Progressing Goal: Will not experience complications related to urinary retention Outcome: Progressing   Problem: Pain Managment: Goal: General experience of comfort will improve and/or be controlled Outcome: Progressing   Problem: Safety: Goal: Ability to remain free from injury will improve Outcome: Progressing   Problem: Skin Integrity: Goal: Risk for impaired skin integrity will decrease Outcome: Progressing   Problem: Education: Goal: Ability to describe self-care measures that may prevent or decrease complications (Diabetes Survival Skills Education) will improve Outcome: Progressing Goal: Individualized Educational Video(s) Outcome: Progressing   Problem: Coping: Goal: Ability to adjust to condition or change in health will improve Outcome: Progressing   Problem: Fluid  Volume: Goal: Ability to maintain a balanced intake and output will improve Outcome: Progressing   Problem: Health Behavior/Discharge Planning: Goal: Ability to identify and utilize available resources and services will improve Outcome: Progressing Goal: Ability to manage health-related needs will improve Outcome: Progressing   Problem: Metabolic: Goal: Ability to maintain appropriate glucose levels will improve Outcome: Progressing   Problem: Nutritional: Goal: Maintenance of adequate nutrition will improve Outcome: Progressing Goal: Progress toward achieving an optimal weight will improve Outcome: Progressing   Problem: Skin Integrity: Goal: Risk for impaired skin integrity will decrease Outcome: Progressing   Problem: Tissue Perfusion: Goal: Adequacy of tissue perfusion will improve Outcome: Progressing   Problem: Education: Goal: Understanding of CV disease, CV risk reduction, and recovery process will improve Outcome: Progressing Goal: Individualized Educational Video(s) Outcome: Progressing   Problem: Activity: Goal: Ability to return to baseline activity level will improve Outcome: Progressing   Problem: Cardiovascular: Goal: Ability to achieve and maintain adequate cardiovascular perfusion will improve Outcome: Progressing Goal: Vascular access site(s) Level 0-1 will be maintained Outcome: Progressing   Problem: Health Behavior/Discharge Planning: Goal: Ability to safely manage health-related needs after discharge will improve Outcome: Progressing   Problem: Education: Goal: Ability to demonstrate management of disease process will improve Outcome: Progressing Goal: Ability to verbalize understanding of medication therapies will improve Outcome: Progressing Goal: Individualized Educational Video(s) Outcome: Progressing   Problem: Activity: Goal: Capacity to carry out activities will improve Outcome: Progressing   Problem: Cardiac: Goal: Ability to  achieve and maintain adequate cardiopulmonary perfusion will improve Outcome: Progressing

## 2024-01-24 NOTE — Progress Notes (Signed)
 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 Patient was diuresed during admission and home lisinopril was held due to renal dysfunction. Discharge weight was 244.2 lbs. Patient reported to cardiology clinic on 01/02/2024 with complaints of dyspnea on exertion and fatigue.  Patient was recently admitted to [facility] 01/07/2024 for planned small bowel endoscopy 2/2 iron deficiency anemia work up - 2 non-bleeding lesions found in duodenum.   Presented to the ED on 3/31 with worsening SOB, hypoxia, and orthopnea. Patient reports chronic but worsening leg swelling  In the ED, patient was hypoxic. JVD and BL leg swelling present on exam. Very mild increased work of breathing, crackles bibasilarly more prominent in the left lung base. CXR 3/31 showed small left pleural effusion, stable cardiac silhouette, left lung base atelectasis or infiltrate. Repeat CXR on 4/7 showed improved aeration to the left lung base. Most recent ECHO 12/15/2023 showed LVEF 55-60% (changed from 60-65% 05/2022) with normal LV function. Moderate concentric LVH. Mildly reduced RV systolic function. Normal MV. Aortic valve regurgitation not visualized. Sclerosis/calcification is present without any evidence of aortic stenosis. Cardiology consulted. Attempted d/ch on 4/6 with torsemide, but deferred due to hypoxia, SOB. IV lasix restarted s/p Metolazone 2.5mg  x3. Patient has rescheduled [facility] for Monday, 4/14 given continued renal impairment.   Daughter present in room. Patient denies SOB, wheezing, and orthopnea. Reports having a chronic cough that  is unchanged since admission. Patient endorses feeling cold. Right hand is cold to touch with no changes in appetite. Compression socks on today with mild BL leg edema present on exam. Patient notes that leg swelling is improving since admission. Of note, middle finger on patient's left hand appears swollen and red compared to rest of hand. Patient reports having a history of gout and that he doesn't have any swollen extremities. Patient expresses interest in [facility] pharmacy for discharge medications but would like to stay [facility] pharmacy.   Current HF Medications: Diuretic: IV lasix gtt 4mg /hr  SGLT2i: farxiga 10mg  daily Other: Kcl BID   Prior to admission HF Medications: Diuretic: Torsemide 20mg  AM and 40mg  PM  Other: Hydralazine 10mg  TID, Kcl daily   Pertinent Lab Values: Serum creatinine admit 1.59> 1.88>1.76 (BL 1.1-1.3) , BUN 36, Potassium 3.5, Sodium 135, BNP 327, Magnesium 1.9, A1c 8.3,   Vital Signs: Weight: 234 lbs (admission weight: 246 lbs) Blood pressure: 120-130s/50-80s  Heart rate: 50-60s ; 136  I/O: net -2.4L yesterday; net -16.7L since admission RA  Medication Assistance / Insurance Benefits Check: Does the patient have prescription insurance?  Yes Type of insurance plan: Medicare   Outpatient Pharmacy:  Prior to admission [facility] [place] Is the patient willing to use [facility] TOC pharmacy at discharge? Yes Is the patient willing to transition their outpatient pharmacy to utilize a [facility] outpatient pharmacy?   No    Assessment: 1. Acute on chronic diastolic CHF (LVEF 55-60%), due to unknown etiology. NYHA class 1 symptoms. Strict I/Os and daily weights. Keep K>4 and Mg>2. 1+ BL LE edema on exam per MD. JVD present. Remains fluid overloaded. Carvedilol recently stopped in 10/2023 due to bradycardia.  Lisinopril held upon discharge in 12/2023 due to renal dysfunction.  - Agree with continuing IV lasix gtt 4mg /hr, can consider de-escalating to BID  dosing if volume status improves.  - Continue farxiga 10mg  daily - Consider MRA (spironolactone 12.5mg ) for HFpEF GDMT titration if renal function continues to improve   - Consider starting Colchicine 1.2 mg x1, followed by 0.6mg  in 1 hour given possible acute gout flare    Plan: 1) Medication changes recommended at this time: - F/U R/LHC on Monday - Start Colchicine 1.2mg  once, followed by 0.6mg  in 1 hour for gout flare  - Monitor K, continue scheduled Kcl until tomorrow  - Monitor renal function   2) Patient assistance: - Patient is interested in obtaining discharge medications from [facility] pharmacy.  London Pepper copay: $30 Marcelline Deist copay: $30  3)  Education  - Patient has been educated on current HF medications and potential additions to HF medication regimen - Patient verbalizes understanding that over the next few months, these medication doses may change and more medications may be added to optimize HF regimen - Patient has been educated on basic disease state pathophysiology and goals of therapy    Verdene Rio, PharmD PGY1 Pharmacy Resident

## 2024-01-25 DIAGNOSIS — I2510 Atherosclerotic heart disease of native coronary artery without angina pectoris: Secondary | ICD-10-CM | POA: Diagnosis not present

## 2024-01-25 DIAGNOSIS — I5030 Unspecified diastolic (congestive) heart failure: Secondary | ICD-10-CM | POA: Diagnosis not present

## 2024-01-25 DIAGNOSIS — I272 Pulmonary hypertension, unspecified: Secondary | ICD-10-CM | POA: Diagnosis not present

## 2024-01-25 DIAGNOSIS — J9601 Acute respiratory failure with hypoxia: Secondary | ICD-10-CM | POA: Diagnosis not present

## 2024-01-25 DIAGNOSIS — I4819 Other persistent atrial fibrillation: Secondary | ICD-10-CM | POA: Diagnosis not present

## 2024-01-25 DIAGNOSIS — N189 Chronic kidney disease, unspecified: Secondary | ICD-10-CM

## 2024-01-25 LAB — GLUCOSE, CAPILLARY
Glucose-Capillary: 172 mg/dL — ABNORMAL HIGH (ref 70–99)
Glucose-Capillary: 139 mg/dL — ABNORMAL HIGH (ref 70–99)
Glucose-Capillary: 153 mg/dL — ABNORMAL HIGH (ref 70–99)
Glucose-Capillary: 205 mg/dL — ABNORMAL HIGH (ref 70–99)
Glucose-Capillary: 269 mg/dL — ABNORMAL HIGH (ref 70–99)
Glucose-Capillary: 155 mg/dL — ABNORMAL HIGH (ref 70–99)

## 2024-01-25 LAB — BASIC METABOLIC PANEL WITH GFR
Anion gap: 13 (ref 5–15)
BUN: 34 mg/dL — ABNORMAL HIGH (ref 8–23)
CO2: 35 mmol/L — ABNORMAL HIGH (ref 22–32)
Calcium: 9.4 mg/dL (ref 8.9–10.3)
Chloride: 88 mmol/L — ABNORMAL LOW (ref 98–111)
Creatinine, Ser: 1.73 mg/dL — ABNORMAL HIGH (ref 0.61–1.24)
GFR, Estimated: 39 mL/min — ABNORMAL LOW (ref >60)
Glucose, Bld: 157 mg/dL — ABNORMAL HIGH (ref 70–99)
Potassium: 3.8 mmol/L (ref 3.5–5.1)
Sodium: 136 mmol/L (ref 135–145)

## 2024-01-25 LAB — CBC
HCT: 30.8 % — ABNORMAL LOW (ref 39.0–52.0)
Hemoglobin: 9.5 g/dL — ABNORMAL LOW (ref 13.0–17.0)
MCH: 28.6 pg (ref 26.0–34.0)
MCHC: 30.8 g/dL (ref 30.0–36.0)
MCV: 92.8 fL (ref 80.0–100.0)
Platelets: 640 10*3/uL — ABNORMAL HIGH (ref 150–400)
RBC: 3.32 MIL/uL — ABNORMAL LOW (ref 4.22–5.81)
RDW: 18.4 % — ABNORMAL HIGH (ref 11.5–15.5)
WBC: 9.2 10*3/uL (ref 4.0–10.5)
nRBC: 0 % (ref 0.0–0.2)

## 2024-01-25 MED ORDER — COLCHICINE 0.6 MG PO TABS
1.2000 mg | ORAL_TABLET | Freq: Once | ORAL | Status: AC
  Administered 2024-01-25: 1.2 mg via ORAL
  Filled 2024-01-25: qty 2

## 2024-01-25 NOTE — Progress Notes (Signed)
 Progress Note  Patient Name: Lucas Dudley Date of Encounter: 01/25/2024 Primary Cardiologist: Teddie Favre, MD   Subjective   Lucas Dudley is a 83 year old with diastolic heart failure, coronary artery disease, and atrial fibrillation with persistent DOE.  He has acute and chronic kidney disease and is on Farxiga. His creatinine is improving off aggressive diuretics. He has not tolerated MRA for his blood pressure and chronic kidney disease.  He had been experiencing recurrent gastrointestinal bleeds, which required him to stop anticoagulation. He is currently not dealing with a gastrointestinal bleed.  He has obstructive sleep apnea and is still on oxygen, though he feels much better and is not on oxygen at baseline.  He feels the CPAP is causing his SOB.  He started smoking at the age of 32 but has stopped for some time.  He has reviewed this history with me at length.  Vital Signs    Vitals:   01/24/24 1952 01/25/24 0040 01/25/24 0446 01/25/24 0826  BP: (!) 133/58 (!) 96/54 108/62 119/64  Pulse: (!) 53 (!) 56 (!) 58 60  Resp: 18 17 18 19   Temp: 98.2 F (36.8 C) 98.6 F (37 C) (!) 97.3 F (36.3 C) 98.4 F (36.9 C)  TempSrc: Oral Oral Oral Oral  SpO2: 99% 94% 100% 90%  Weight:   105.3 kg   Height:        Intake/Output Summary (Last 24 hours) at 01/25/2024 0838 Last data filed at 01/25/2024 4098 Gross per 24 hour  Intake 1003.21 ml  Output 850 ml  Net 153.21 ml   Filed Weights   01/23/24 0509 01/24/24 0600 01/25/24 0446  Weight: 106.3 kg 106.2 kg 105.3 kg    Physical Exam   GEN: No acute distress.  Obese male Neck: JVD Cardiac: RRR, distant heart sounds Respiratory: Decreased breath sounds bilaterally. GI: Soft, nontender, distended with fluid wave MS: skin is warm, minimal edema  Labs   EKG: Sinus rhythm, right bundle branch block (01/15/2024) Telemetry: Sinus rhythm, first-degree heart block, low voltage P waves, rare PVCs  Chemistry Recent Labs   Lab 01/23/24 0233 01/24/24 0312 01/25/24 0359  NA 135 135 136  K 3.4* 3.5 3.8  CL 86* 86* 88*  CO2 35* 36* 35*  GLUCOSE 198* 147* 157*  BUN 38* 36* 34*  CREATININE 1.88* 1.76* 1.73*  CALCIUM 9.5 9.2 9.4  GFRNONAA 35* 38* 39*  ANIONGAP 14 13 13      Hematology Recent Labs  Lab 01/23/24 0233 01/24/24 0312 01/25/24 0359  WBC 9.3 8.7 9.2  RBC 3.16* 3.05* 3.32*  HGB 9.2* 8.8* 9.5*  HCT 29.4* 28.0* 30.8*  MCV 93.0 91.8 92.8  MCH 29.1 28.9 28.6  MCHC 31.3 31.4 30.8  RDW 18.6* 18.4* 18.4*  PLT 542* 538* 640*    Cardiac Studies   Cardiac Studies & Procedures   ______________________________________________________________________________________________ CARDIAC CATHETERIZATION  CARDIAC CATHETERIZATION 03/17/2015  Narrative Images from the original result were not included.  Ost 1st Diag lesion, 100% stenosed.  Prox LAD to Mid LAD lesion, 50% stenosed.  RPDA lesion, 40% stenosed.  Lucas Dudley is a 83 y.o. male   [id number] LOCATION:  FACILITY: [facility] PHYSICIAN: Lauro Portal, M.D. Jan 21, 1941   DATE OF PROCEDURE:  03/17/2015  DATE OF DISCHARGE:     CARDIAC CATHETERIZATION    History obtained from chart review.The patient is a 83 year old, mild to mildly overweight, married Caucasian male, father of 1, grandfather to 2 grandchildren who I last saw in the office /  5/16. He is retired from [employer]. His risk factors include hypertension, hyperlipidemia, as well as diabetes and remote tobacco abuse having quit 25 years ago. His mother did die of an MI at age 29. He has never had a heart attack or stroke. His last stress test was performed approximately 3 years ago was nonischemic. Since I saw him 6 months ago he developed dyspnea on exertion and some orthopnea several weeks ago which has ultimately improved with the addition of an oral diuretic. He does get occasional chest tightness. Recent Myoview stress test performed 02/09/15 showed inferior scar with moderate  peri-infarct ischemia and a 2-D echo showed an EF of 40% which represents a decline from 50-55% by echo back in 2010. An event Monitor showed sick sinus syndrome/PAF with pauses up to 2.5 seconds. He presents today for outpatient diagnostic coronary arteriography via the right radial approach to define his anatomy and rule out an ischemic etiology      IMPRESSION:Lucas Dudley has noncritical CAD with severe LV dysfunction. I think that his inferior perfusion abnormality was artifactual. I do not think his moderate proximal LAD lesion is hemostatically significant. 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 The patient received 5000 units of heparin intravenously. He received radial cocktail. The SideArm sheath. A total of 55 mL of contrast was used for the case. The sheath was removed and a TR band was placed on the right wrist to achieve patent hemostasis. The patient left the lab in stable condition. He'll be discharged home in 2 hours and will follow-up with me in the office in several weeks.  Lauro Portal. MD, [facility] 03/17/2015 12:46 PM  Findings Coronary Findings Diagnostic  Dominance: Right  Left Anterior Descending The lesion is type non-C, tubular .  The lesion was not previously treated.  Pressure wire/FFR was not performed on the lesionIVUS was not performed on the lesion.  First Diagonal Branch  Right Coronary Artery  Right Posterior Descending Artery  Intervention  No interventions have been documented.   STRESS TESTS  MYOCARDIAL PERFUSION IMAGING 02/02/2020  Narrative  Nuclear stress EF: 31%.  There was no ST segment deviation noted during stress.  Findings consistent with prior myocardial infarction.  This is a high risk study.  The left ventricular ejection fraction is moderately decreased (30-44%).  Low exercise tolerance (3 METs), unable to reach target heart rate, so study converted to pharmacologic. Baseline  blood pressure very elevated at baseline (198/96). There are diffuse perfusion defects at rest and with stress, sparing only the mid-lateral wall. There is no significant reversibility on this study. Based on distribution, would be concerned that this is artifact. However, with low EF, suggests diffuse areas of poor perfusion without significant reversibility.   ECHOCARDIOGRAM  ECHOCARDIOGRAM COMPLETE 12/15/2023  Narrative ECHOCARDIOGRAM REPORT    Patient Name:   RICKARD KENNERLY Date of Exam: 12/15/2023 Medical Rec #:  [id number]     Height:       70.0 in Accession #:    [id number]    Weight:       251.1 lb Date of Birth:  [birth year]      BSA:          2.299 m Patient Age:    82 years      BP:           155/75 mmHg Patient Gender: M             HR:  64 bpm. Exam Location:  [facility]  Procedure: 2D Echo, Cardiac Doppler, Color Doppler and Intracardiac Opacification Agent (Both Spectral and Color Flow Doppler were utilized during procedure).  Indications:    CHF  History:        Patient has prior history of Echocardiogram examinations, most recent 06/06/2022. Cardiomyopathy, CAD; Risk Factors:Sleep Apnea, Former Smoker, Hypertension and Diabetes.  Sonographer:    Adelia Homestead RVT RCS Referring Phys: [id number] EJIROGHENE E EMOKPAE  IMPRESSIONS   1. Left ventricular ejection fraction, by estimation, is 55 to 60%. Left ventricular ejection fraction by PLAX is 59 %. The left ventricle has normal function. The left ventricle has no regional wall motion abnormalities. There is moderate concentric left ventricular hypertrophy. Left ventricular diastolic parameters are indeterminate. 2. Right ventricular systolic function is mildly reduced. The right ventricular size is normal. There is severely elevated pulmonary artery systolic pressure. The estimated right ventricular systolic pressure is 62.6 mmHg. 3. The mitral valve is grossly normal. No evidence of mitral valve regurgitation. 4. The  aortic valve is tricuspid. Aortic valve regurgitation is not visualized. Aortic valve sclerosis/calcification is present, without any evidence of aortic stenosis. 5. The inferior vena cava is dilated in size with <50% respiratory variability, suggesting right atrial pressure of 15 mmHg.  Comparison(s): Changes from prior study are noted. 06/06/2022: LVEF 60-65%, mild LVH, low normal RV function.  FINDINGS Left Ventricle: Left ventricular ejection fraction, by estimation, is 55 to 60%. Left ventricular ejection fraction by PLAX is 59 %. The left ventricle has normal function. The left ventricle has no regional wall motion abnormalities. Definity contrast agent was given IV to delineate the left ventricular endocardial borders. Strain imaging was not performed. The left ventricular internal cavity size was normal in size. There is moderate concentric left ventricular hypertrophy. Left ventricular diastolic parameters are indeterminate.  Right Ventricle: The right ventricular size is normal. No increase in right ventricular wall thickness. Right ventricular systolic function is mildly reduced. There is severely elevated pulmonary artery systolic pressure. The tricuspid regurgitant velocity is 3.45 m/s, and with an assumed right atrial pressure of 15 mmHg, the estimated right ventricular systolic pressure is 62.6 mmHg.  Left Atrium: Left atrial size was normal in size.  Right Atrium: Right atrial size was normal in size.  Pericardium: Trivial pericardial effusion is present. The pericardial effusion is posterior to the left ventricle.  Mitral Valve: The mitral valve is grossly normal. No evidence of mitral valve regurgitation.  Tricuspid Valve: The tricuspid valve is not well visualized. Tricuspid valve regurgitation is not demonstrated.  Aortic Valve: The aortic valve is tricuspid. Aortic valve regurgitation is not visualized. Aortic valve sclerosis/calcification is present, without any evidence  of aortic stenosis. Aortic valve mean gradient measures 4.0 mmHg. Aortic valve peak gradient measures 6.8 mmHg. Aortic valve area, by VTI measures 1.59 cm.  Pulmonic Valve: The pulmonic valve was not well visualized. Pulmonic valve regurgitation is not visualized.  Aorta: The aortic root and ascending aorta are structurally normal, with no evidence of dilitation.  Venous: The inferior vena cava is dilated in size with less than 50% respiratory variability, suggesting right atrial pressure of 15 mmHg.  IAS/Shunts: No atrial level shunt detected by color flow Doppler.  Additional Comments: 3D imaging was not performed.   LEFT VENTRICLE PLAX 2D LV EF:         Left            Diastology ventricular     LV e' medial:  5.81 cm/s ejection        LV E/e' medial:  13.9 fraction by     LV e' lateral:   8.81 cm/s PLAX is 59      LV E/e' lateral: 9.1 %. LVIDd:         5.55 cm LVIDs:         3.80 cm LV PW:         1.65 cm LV IVS:        1.75 cm LVOT diam:     2.20 cm LV SV:         46 LV SV Index:   20 LVOT Area:     3.80 cm   RIGHT VENTRICLE            IVC RV Basal diam:  4.40 cm    IVC diam: 2.40 cm RV S prime:     8.93 cm/s TAPSE (M-mode): 1.8 cm  LEFT ATRIUM             Index        RIGHT ATRIUM           Index LA diam:        4.30 cm 1.87 cm/m   RA Area:     20.00 cm LA Vol (A2C):   73.8 ml 32.10 ml/m  RA Volume:   58.90 ml  25.62 ml/m LA Vol (A4C):   54.1 ml 23.53 ml/m LA Biplane Vol: 64.3 ml 27.97 ml/m AORTIC VALVE                    PULMONIC VALVE AV Area (Vmax):    1.83 cm     PV Vmax:       0.51 m/s AV Area (Vmean):   1.71 cm     PV Peak grad:  1.1 mmHg AV Area (VTI):     1.59 cm AV Vmax:           130.00 cm/s AV Vmean:          86.900 cm/s AV VTI:            0.289 m AV Peak Grad:      6.8 mmHg AV Mean Grad:      4.0 mmHg LVOT Vmax:         62.60 cm/s LVOT Vmean:        39.000 cm/s LVOT VTI:          0.121 m LVOT/AV VTI ratio: 0.42  AORTA Ao Root  diam: 3.00 cm Ao Asc diam:  3.60 cm  MITRAL VALVE               TRICUSPID VALVE MV Area (PHT): 4.41 cm    TR Peak grad:   47.6 mmHg MV Decel Time: 172 msec    TR Vmax:        345.00 cm/s MV E velocity: 80.60 cm/s MV A velocity: 16.30 cm/s  SHUNTS MV E/A ratio:  4.94        Systemic VTI:  0.12 m Systemic Diam: 2.20 cm  Dinah Franco MD Electronically signed by Dinah Franco MD Signature Date/Time: 12/15/2023/11:41:54 AM    Final   TEE  ECHO TEE 06/28/2022  Narrative TRANSESOPHOGEAL ECHO REPORT    Patient Name:   Seabron Cypress Date of Exam: 06/28/2022 Medical Rec #:  [id number]     Height:       70.0 in Accession #:    [id number]    Weight:  239.0 lb Date of Birth:  [birth year]      BSA:          2.251 m Patient Age:    79 years      BP:           182/82 mmHg Patient Gender: M             HR:           65 bpm. Exam Location:  Inpatient  Procedure: Transesophageal Echo, 3D Echo, Color Doppler and Cardiac Doppler  Indications:     I48.91* Unspecified atrial fibrillation  History:         Patient has prior history of Echocardiogram examinations, most recent 06/06/2022. Arrythmias:Atrial Fibrillation; Risk Factors:Hypertension, Diabetes, Dyslipidemia and Sleep Apnea.  Sonographer:     Sherline Distel Senior RDCS Referring Phys:  [phone] MICHAEL COOPER Diagnosing Phys: Luana Rumple MD   Sonographer Comments: Watchman Procedure   PROCEDURE: After discussion of the risks and benefits of a TEE, an informed consent was obtained from the patient. The transesophogeal probe was passed without difficulty through the esophogus of the patient. Sedation performed by different physician. The patient was monitored while under deep sedation. The patient developed no complications during the procedure.  TEE (including live 3D imaging and 3D postprocessing) was used to guide transseptal puncture, direct device deployment and evaluate the final result and screen for  complications.  IMPRESSIONS   1. Left ventricular ejection fraction, by estimation, is 55 to 60%. The left ventricle has normal function. The left ventricle has no regional wall motion abnormalities. 2. Right ventricular systolic function is normal. The right ventricular size is normal. Tricuspid regurgitation signal is inadequate for assessing PA pressure. 3. After the procedure there is a well-seated 31 mm Watchman FLX appendage occlusion device, with adequate compression and no evidence of peridevice leak. After the procedure there is a tiny atrial septal defect in the fossa ovalis, with exclusively left-to-right shunt. Left atrial size was mild to moderately dilated. No left atrial/left atrial appendage thrombus was detected. 4. Right atrial size was mildly dilated. 5. The mitral valve is normal in structure. Mild mitral valve regurgitation. 6. The aortic valve is tricuspid. Aortic valve regurgitation is not visualized. No aortic stenosis is present. 7. There is mild (Grade II) layered plaque.  FINDINGS Left Ventricle: Left ventricular ejection fraction, by estimation, is 55 to 60%. The left ventricle has normal function. The left ventricle has no regional wall motion abnormalities. The left ventricular internal cavity size was normal in size. There is no left ventricular hypertrophy.  Right Ventricle: The right ventricular size is normal. No increase in right ventricular wall thickness. Right ventricular systolic function is normal. Tricuspid regurgitation signal is inadequate for assessing PA pressure.  Left Atrium: After the procedure there is a well-seated 31 mm Watchman FLX appendage occlusion device, with adequate compression and no evidence of peridevice leak. After the procedure there is a tiny atrial septal defect in the fossa ovalis, with exclusively left-to-right shunt. Left atrial size was mild to moderately dilated. No left atrial/left atrial appendage thrombus was  detected.  Right Atrium: Right atrial size was mildly dilated.  Pericardium: There is no evidence of pericardial effusion.  Mitral Valve: The mitral valve is normal in structure. Mild mitral valve regurgitation, with centrally-directed jet.  Tricuspid Valve: The tricuspid valve is normal in structure. Tricuspid valve regurgitation is trivial.  Aortic Valve: The aortic valve is tricuspid. Aortic valve regurgitation is not visualized. No aortic stenosis is present.  Pulmonic Valve: The pulmonic valve was normal in structure. Pulmonic valve regurgitation is mild.  Aorta: The aortic root, ascending aorta, aortic arch and descending aorta are all structurally normal, with no evidence of dilitation or obstruction. There is mild (Grade II) layered plaque.  IAS/Shunts: The interatrial septum appears to be lipomatous.  Luana Rumple MD Electronically signed by Luana Rumple MD Signature Date/Time: 06/28/2022/1:25:16 PM    Final  MONITORS  CARDIAC EVENT MONITOR 02/16/2015  Narrative  Paroxysmal atrial fib  The patient was in atrial fib for a significant amount of the monitored time. Many days he had atrial fib for almost the entire day. He had several pauses of > 2 seconds. 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 - NYHA III, clinical suggestive of right heart failure Chronic condition with right ventricular dysfunction, currently well-managed with no acute exacerbation. Creatinine levels are improving off aggressive diuretics. He is minimally net negative and asymptomatic.  He feels better with fluid management but is not on oxygen at baseline. Advanced heart failure team to perform left and right heart catheterization due to significant pulmonary hypertension, which may be mixed in nature.   Consented for [facility] 01/24/24 (See Dr. Jesus Morones Note).  He has no additional concerns. 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 Left and right heart catheterization planned to assess pulmonary pressures. - if PAH component, Reveal Lite 2 Score 11 - of recent echo, there is evidence of RV-PA uncoupling; (0.29), suggesting that the right ventricle may not be adequately compensating for the increased afterload imposed by the elevated pulmonary pressures  Coronary artery disease Chronic coronary artery disease. Scheduled for left and right heart catheterization to assess coronary status; asymptomatic  Atrial fibrillation Persistent atrial fibrillation status post Watchman implant. Currently managed with amiodarone 200 mg. Recent EKG shows sinus rhythm.  SR on telemetry - Continue amiodarone 200 mg for atrial fibrillation management.  Chronic kidney disease Chronic kidney disease with improving creatinine levels off aggressive diuretics. Blood pressure and kidney disease have not tolerated MRA.  - Consider finerinone as an outpatient if appropriate. - continue SGLT2i - pending RHC  Type 2 diabetes mellitus - managed by TRH  Iron deficiency anemia - Chronic condition managed with IV iron. Currently not dealing with a GI bleed and is tolerating aspirin without bleeding. - Continue aspirin 81 mg.  Obstructive sleep apnea - discussed use of his CPAP   For questions or updates, please contact [facility] Please consult [email or web address] for contact info under Cardiology/STEMI.      Gloriann Larger, MD FASE [facility] Cardiologist [facility]  [address] [place], [address] [place], [place] [address] [phone]  8:38 AM

## 2024-01-25 NOTE — Plan of Care (Signed)

## 2024-01-25 NOTE — Progress Notes (Signed)
 PROGRESS NOTE    Lucas Dudley  NGE:[id number] DOB: [birth year] DOA: 01/13/2024 PCP: Meldon Sport, MD  83/M with diastolic CHF, CAD, persistent A-fib with Watchman device, recurrent GI bleed, OSA, type 2 diabetes mellitus, iron deficiency anemia presented to the ED with progressive dyspnea on exertion, hypoxia from heme-onc clinic.  Labs noted creatinine of 1.59, hemoglobin 8.8, BNP 327, troponin 31, 34, chest x-ray with left lung atelectasis versus infiltrate, EKG noted A-fib -Admitted at [facility], started on lasix gtt, transferred to [facility] for RHC/LHC   Subjective: -Feels fair overall, breathing continues to improve  Assessment and Plan:  Acute hypoxic respiratory failure (HCC) -In the setting of CHF -Also suspect some underlying COPD, former heavy smoker with 54-pack-year smoking history, quit about 30 years ago, would benefit from PFTs -Continue to wean O2   Acute on chronic combined systolic and diastolic CHF  Pulmonary hypertension, RV dysfunction -Echo 3/25 noted EF 55-60%, indeterminate diastolic function, mildly reduced RV, severe pulmonary hypertension, PASP 63 -Admitted with volume overload, diuresed on Lasix drip, down 20 LB, diuretics per cardiology --Suspect underlying COPD/OSA contributing to [facility] -Cards following, plan for right and left heart cath possibly Monday -GDMT limited by AKI/CKD  Coronary artery disease  -Continue aspirin, plan for [facility] on Monday  OSA -Continue nightly CPAP  Persistent atrial fibrillation -Had left atrial appendage occlusion/watchman's device per Dr. Arlester Ladd 9/23 -Off anticoagulation, continue amiodarone  History of iron deficiency anemia, GI bleeds -Followed by oncology receives periodic iron infusions -Hemoglobin relatively stable, monitor  Type 2 diabetes mellitus with other specified complication (HCC) A1c 8.3. - SSi- M -Increase insulin, glipizide and metformin on hold  CKD 3b -Baseline creatinine around 1.2-1.6,  creatinine slightly higher at 1.7 now and stable   DVT prophylaxis: Lovenox Code Status: Full code Family Communication: None present Disposition Plan: Home pending above workup  Consultants: Cards   Objective: Vitals:   01/24/24 1952 01/25/24 0040 01/25/24 0446 01/25/24 0826  BP: (!) 133/58 (!) 96/54 108/62 119/64  Pulse: (!) 53 (!) 56 (!) 58 60  Resp: 18 17 18 19   Temp: 98.2 F (36.8 C) 98.6 F (37 C) (!) 97.3 F (36.3 C) 98.4 F (36.9 C)  TempSrc: Oral Oral Oral Oral  SpO2: 99% 94% 100% 90%  Weight:   105.3 kg   Height:        Intake/Output Summary (Last 24 hours) at 01/25/2024 1059 Last data filed at 01/25/2024 1000 Gross per 24 hour  Intake 1200 ml  Output 1300 ml  Net -100 ml   Filed Weights   01/23/24 0509 01/24/24 0600 01/25/24 0446  Weight: 106.3 kg 106.2 kg 105.3 kg    Examination:  General exam: Chronically ill male sitting up in the recliner, AAOx3 HEENT: Positive JVD CVS: S1-S2, regular rhythm Lungs: Improving air movement, otherwise clear Abdomen: Soft, nontender, bowel sounds present Remedies: Trace edema Skin: No rashes Psychiatry:  Mood & affect appropriate.     Data Reviewed:   CBC: Recent Labs  Lab 01/21/24 0446 01/22/24 0539 01/23/24 0233 01/24/24 0312 01/25/24 0359  WBC 8.0 9.6 9.3 8.7 9.2  HGB 8.6* 9.3* 9.2* 8.8* 9.5*  HCT 28.5* 31.2* 29.4* 28.0* 30.8*  MCV 95.3 95.4 93.0 91.8 92.8  PLT 412* 532* 542* 538* 640*   Basic Metabolic Panel: Recent Labs  Lab 01/19/24 0414 01/20/24 0450 01/21/24 0446 01/22/24 0539 01/23/24 0233 01/24/24 0312 01/25/24 0359  NA 137 137 137 135 135 135 136  K 3.4* 2.8* 3.4* 3.5 3.4*  3.5 3.8  CL 90* 85* 85* 85* 86* 86* 88*  CO2 35* 37* 41* 37* 35* 36* 35*  GLUCOSE 252* 193* 145* 170* 198* 147* 157*  BUN 30* 32* 32* 35* 38* 36* 34*  CREATININE 1.60* 1.62* 1.66* 1.74* 1.88* 1.76* 1.73*  CALCIUM 9.3 9.6 9.3 9.7 9.5 9.2 9.4  MG 1.8 1.8 1.8 1.9 1.9  --   --    GFR: Estimated Creatinine  Clearance: 40.7 mL/min (A) (by C-G formula based on SCr of 1.73 mg/dL (H)). Liver Function Tests: No results for input(s): "AST", "ALT", "ALKPHOS", "BILITOT", "PROT", "ALBUMIN" in the last 168 hours. No results for input(s): "LIPASE", "AMYLASE" in the last 168 hours. No results for input(s): "AMMONIA" in the last 168 hours. Coagulation Profile: No results for input(s): "INR", "PROTIME" in the last 168 hours. Cardiac Enzymes: No results for input(s): "CKTOTAL", "CKMB", "CKMBINDEX", "TROPONINI" in the last 168 hours. BNP (last 3 results) No results for input(s): "PROBNP" in the last 8760 hours. HbA1C: No results for input(s): "HGBA1C" in the last 72 hours. CBG: Recent Labs  Lab 01/24/24 1103 01/24/24 1640 01/24/24 2047 01/25/24 0328 01/25/24 0553  GLUCAP 140* 77 270* 172* 139*   Lipid Profile: Recent Labs    01/24/24 0312  CHOL 133  HDL 26*  LDLCALC 89  TRIG 91  CHOLHDL 5.1  LDLDIRECT 83   Thyroid Function Tests: No results for input(s): "TSH", "T4TOTAL", "FREET4", "T3FREE", "THYROIDAB" in the last 72 hours. Anemia Panel: No results for input(s): "VITAMINB12", "FOLATE", "FERRITIN", "TIBC", "IRON", "RETICCTPCT" in the last 72 hours. Urine analysis:    Component Value Date/Time   COLORURINE YELLOW 10/18/2018 1525   APPEARANCEUR CLEAR 10/18/2018 1525   LABSPEC 1.012 10/18/2018 1525   PHURINE 5.0 10/18/2018 1525   GLUCOSEU 50 (A) 10/18/2018 1525   HGBUR MODERATE (A) 10/18/2018 1525   BILIRUBINUR NEGATIVE 10/18/2018 1525   KETONESUR NEGATIVE 10/18/2018 1525   PROTEINUR 100 (A) 10/18/2018 1525   NITRITE NEGATIVE 10/18/2018 1525   LEUKOCYTESUR NEGATIVE 10/18/2018 1525   Sepsis Labs: @LABRCNTIP (procalcitonin:4,lacticidven:4)  )No results found for this or any previous visit (from the past 240 hours).   Radiology Studies: No results found.   Scheduled Meds:  amiodarone  200 mg Oral QHS   aspirin  81 mg Oral Daily   brimonidine  1 drop Both Eyes BID   And    timolol  1 drop Both Eyes BID   dapagliflozin propanediol  10 mg Oral Daily   docusate sodium  100 mg Oral BID   enoxaparin (LOVENOX) injection  40 mg Subcutaneous Q24H   insulin aspart  0-15 Units Subcutaneous TID WC   insulin aspart  0-5 Units Subcutaneous QHS   insulin aspart  10 Units Subcutaneous TID WC   insulin glargine-yfgn  30 Units Subcutaneous QHS   pantoprazole  40 mg Oral Daily   polyethylene glycol  17 g Oral Daily   rosuvastatin  40 mg Oral Daily   Continuous Infusions:     LOS: 12 days    Time spent:    Deforest Fast, MD [facility]   01/25/2024, 10:59 AM

## 2024-01-26 DIAGNOSIS — J9601 Acute respiratory failure with hypoxia: Secondary | ICD-10-CM | POA: Diagnosis not present

## 2024-01-26 DIAGNOSIS — I4819 Other persistent atrial fibrillation: Secondary | ICD-10-CM | POA: Diagnosis not present

## 2024-01-26 DIAGNOSIS — I2510 Atherosclerotic heart disease of native coronary artery without angina pectoris: Secondary | ICD-10-CM | POA: Diagnosis not present

## 2024-01-26 DIAGNOSIS — I5030 Unspecified diastolic (congestive) heart failure: Secondary | ICD-10-CM | POA: Diagnosis not present

## 2024-01-26 DIAGNOSIS — I272 Pulmonary hypertension, unspecified: Secondary | ICD-10-CM | POA: Diagnosis not present

## 2024-01-26 LAB — CBC
HCT: 27.3 % — ABNORMAL LOW (ref 39.0–52.0)
Hemoglobin: 8.5 g/dL — ABNORMAL LOW (ref 13.0–17.0)
MCH: 28.3 pg (ref 26.0–34.0)
MCHC: 31.1 g/dL (ref 30.0–36.0)
MCV: 91 fL (ref 80.0–100.0)
Platelets: 533 10*3/uL — ABNORMAL HIGH (ref 150–400)
RBC: 3 MIL/uL — ABNORMAL LOW (ref 4.22–5.81)
RDW: 18.3 % — ABNORMAL HIGH (ref 11.5–15.5)
WBC: 10.4 10*3/uL (ref 4.0–10.5)
nRBC: 0 % (ref 0.0–0.2)

## 2024-01-26 LAB — BASIC METABOLIC PANEL WITH GFR
Anion gap: 11 (ref 5–15)
BUN: 36 mg/dL — ABNORMAL HIGH (ref 8–23)
CO2: 34 mmol/L — ABNORMAL HIGH (ref 22–32)
Calcium: 8.9 mg/dL (ref 8.9–10.3)
Chloride: 89 mmol/L — ABNORMAL LOW (ref 98–111)
Creatinine, Ser: 1.89 mg/dL — ABNORMAL HIGH (ref 0.61–1.24)
GFR, Estimated: 35 mL/min — ABNORMAL LOW (ref >60)
Glucose, Bld: 120 mg/dL — ABNORMAL HIGH (ref 70–99)
Potassium: 3.5 mmol/L (ref 3.5–5.1)
Sodium: 134 mmol/L — ABNORMAL LOW (ref 135–145)

## 2024-01-26 LAB — GLUCOSE, CAPILLARY
Glucose-Capillary: 123 mg/dL — ABNORMAL HIGH (ref 70–99)
Glucose-Capillary: 124 mg/dL — ABNORMAL HIGH (ref 70–99)
Glucose-Capillary: 156 mg/dL — ABNORMAL HIGH (ref 70–99)
Glucose-Capillary: 239 mg/dL — ABNORMAL HIGH (ref 70–99)

## 2024-01-26 MED ORDER — FUROSEMIDE 10 MG/ML IJ SOLN
60.0000 mg | Freq: Once | INTRAMUSCULAR | Status: AC
  Administered 2024-01-26: 60 mg via INTRAVENOUS
  Filled 2024-01-26: qty 6

## 2024-01-26 NOTE — Progress Notes (Signed)
 PROGRESS NOTE    Lucas Dudley  ZDG:[id number] DOB: [birth year] DOA: 01/13/2024 PCP: Meldon Sport, MD  83/M with diastolic CHF, CAD, persistent A-fib with Watchman device, recurrent GI bleed, OSA, type 2 diabetes mellitus, iron deficiency anemia presented to the ED with progressive dyspnea on exertion, hypoxia from heme-onc clinic.  Labs noted creatinine of 1.59, hemoglobin 8.8, BNP 327, troponin 31, 34, chest x-ray with left lung atelectasis versus infiltrate, EKG noted A-fib -Admitted at [facility], started on lasix gtt, transferred to [facility] for RHC/LHC, has been delayed on account of AKI/CKD   Subjective: -Feels well, overall improving  Assessment and Plan:  Acute hypoxic respiratory failure (HCC) -In the setting of CHF -Also suspect some underlying COPD, former heavy smoker with 54-pack-year smoking history, quit about 30 years ago, would benefit from PFTs -Continue to wean O2   Acute on chronic combined systolic and diastolic CHF  Pulmonary hypertension, RV dysfunction -Echo 3/25 noted EF 55-60%, indeterminate diastolic function, mildly reduced RV, severe pulmonary hypertension, PASP 63 -Admitted with volume overload, diuresed on Lasix drip, down 20 LB, diuretics per cardiology --Suspect underlying COPD/OSA contributing to [facility] -Cards following, plan for right and left heart cath possibly Monday -GDMT limited by AKI/CKD  Coronary artery disease  -Continue aspirin, plan for [facility] on Monday  OSA -Continue nightly CPAP  Persistent atrial fibrillation -Had left atrial appendage occlusion/watchman's device per Dr. Arlester Ladd 9/23 -Off anticoagulation, continue amiodarone  History of iron deficiency anemia, GI bleeds -Followed by oncology receives periodic iron infusions -Hemoglobin relatively stable, monitor  Type 2 diabetes mellitus with other specified complication (HCC) A1c 8.3. - SSi- M -Increase insulin, glipizide and metformin on hold  CKD 3b -Baseline  creatinine around 1.2-1.6, creatinine slightly higher at 1.8 now and stable   DVT prophylaxis: Lovenox Code Status: Full code Family Communication: None present Disposition Plan: Home pending above workup  Consultants: Cards   Objective: Vitals:   01/25/24 2006 01/26/24 0050 01/26/24 0520 01/26/24 0809  BP: (!) 141/54 117/61 (!) 100/47 (!) 116/51  Pulse: (!) 58 (!) 58 66 60  Resp: 17 20 20 15   Temp: 98.2 F (36.8 C) 98.2 F (36.8 C) 99.6 F (37.6 C) 99.5 F (37.5 C)  TempSrc: Oral Oral Oral Oral  SpO2: 91% 91% 91% 100%  Weight:   105.8 kg   Height:        Intake/Output Summary (Last 24 hours) at 01/26/2024 1059 Last data filed at 01/26/2024 0900 Gross per 24 hour  Intake 480 ml  Output 700 ml  Net -220 ml   Filed Weights   01/24/24 0600 01/25/24 0446 01/26/24 0520  Weight: 106.2 kg 105.3 kg 105.8 kg    Examination:  General exam: Chronically ill male sitting up in the recliner, AAOx3 HEENT: Positive JVD CVS: S1-S2, regular rhythm Lungs: Decreased breath sounds bilaterally Abdomen: Soft, nontender, bowel sounds present Remedies: Trace edema Skin: No rashes Psychiatry:  Mood & affect appropriate.     Data Reviewed:   CBC: Recent Labs  Lab 01/22/24 0539 01/23/24 0233 01/24/24 0312 01/25/24 0359 01/26/24 0257  WBC 9.6 9.3 8.7 9.2 10.4  HGB 9.3* 9.2* 8.8* 9.5* 8.5*  HCT 31.2* 29.4* 28.0* 30.8* 27.3*  MCV 95.4 93.0 91.8 92.8 91.0  PLT 532* 542* 538* 640* 533*   Basic Metabolic Panel: Recent Labs  Lab 01/20/24 0450 01/21/24 0446 01/22/24 0539 01/23/24 0233 01/24/24 0312 01/25/24 0359 01/26/24 0257  NA 137 137 135 135 135 136 134*  K 2.8* 3.4* 3.5  3.4* 3.5 3.8 3.5  CL 85* 85* 85* 86* 86* 88* 89*  CO2 37* 41* 37* 35* 36* 35* 34*  GLUCOSE 193* 145* 170* 198* 147* 157* 120*  BUN 32* 32* 35* 38* 36* 34* 36*  CREATININE 1.62* 1.66* 1.74* 1.88* 1.76* 1.73* 1.89*  CALCIUM 9.6 9.3 9.7 9.5 9.2 9.4 8.9  MG 1.8 1.8 1.9 1.9  --   --   --     GFR: Estimated Creatinine Clearance: 37.3 mL/min (A) (by C-G formula based on SCr of 1.89 mg/dL (H)). Liver Function Tests: No results for input(s): "AST", "ALT", "ALKPHOS", "BILITOT", "PROT", "ALBUMIN" in the last 168 hours. No results for input(s): "LIPASE", "AMYLASE" in the last 168 hours. No results for input(s): "AMMONIA" in the last 168 hours. Coagulation Profile: No results for input(s): "INR", "PROTIME" in the last 168 hours. Cardiac Enzymes: No results for input(s): "CKTOTAL", "CKMB", "CKMBINDEX", "TROPONINI" in the last 168 hours. BNP (last 3 results) No results for input(s): "PROBNP" in the last 8760 hours. HbA1C: No results for input(s): "HGBA1C" in the last 72 hours. CBG: Recent Labs  Lab 01/25/24 1151 01/25/24 1258 01/25/24 1546 01/25/24 2130 01/26/24 0628  GLUCAP 153* 205* 269* 155* 123*   Lipid Profile: Recent Labs    01/24/24 0312  CHOL 133  HDL 26*  LDLCALC 89  TRIG 91  CHOLHDL 5.1  LDLDIRECT 83   Thyroid Function Tests: No results for input(s): "TSH", "T4TOTAL", "FREET4", "T3FREE", "THYROIDAB" in the last 72 hours. Anemia Panel: No results for input(s): "VITAMINB12", "FOLATE", "FERRITIN", "TIBC", "IRON", "RETICCTPCT" in the last 72 hours. Urine analysis:    Component Value Date/Time   COLORURINE YELLOW 10/18/2018 1525   APPEARANCEUR CLEAR 10/18/2018 1525   LABSPEC 1.012 10/18/2018 1525   PHURINE 5.0 10/18/2018 1525   GLUCOSEU 50 (A) 10/18/2018 1525   HGBUR MODERATE (A) 10/18/2018 1525   BILIRUBINUR NEGATIVE 10/18/2018 1525   KETONESUR NEGATIVE 10/18/2018 1525   PROTEINUR 100 (A) 10/18/2018 1525   NITRITE NEGATIVE 10/18/2018 1525   LEUKOCYTESUR NEGATIVE 10/18/2018 1525   Sepsis Labs: @LABRCNTIP (procalcitonin:4,lacticidven:4)  )No results found for this or any previous visit (from the past 240 hours).   Radiology Studies: No results found.   Scheduled Meds:  amiodarone  200 mg Oral QHS   aspirin  81 mg Oral Daily   brimonidine  1  drop Both Eyes BID   And   timolol  1 drop Both Eyes BID   dapagliflozin propanediol  10 mg Oral Daily   docusate sodium  100 mg Oral BID   enoxaparin (LOVENOX) injection  40 mg Subcutaneous Q24H   furosemide  60 mg Intravenous Once   insulin aspart  0-15 Units Subcutaneous TID WC   insulin aspart  0-5 Units Subcutaneous QHS   insulin aspart  10 Units Subcutaneous TID WC   insulin glargine-yfgn  30 Units Subcutaneous QHS   pantoprazole  40 mg Oral Daily   polyethylene glycol  17 g Oral Daily   rosuvastatin  40 mg Oral Daily   Continuous Infusions:     LOS: 13 days    Time spent:    Deforest Fast, MD [facility]   01/26/2024, 10:59 AM

## 2024-01-26 NOTE — Progress Notes (Signed)
 Progress Note  Patient Name: Lucas Dudley Date of Encounter: 01/26/2024 Primary Cardiologist: Teddie Favre, MD   Subjective   No events overnight. He is now on room ai at a Sat of 98% at time of exam.  Vital Signs    Vitals:   01/25/24 2006 01/26/24 0050 01/26/24 0520 01/26/24 0809  BP: (!) 141/54 117/61 (!) 100/47 (!) 116/51  Pulse: (!) 58 (!) 58 66 60  Resp: 17 20 20 15   Temp: 98.2 F (36.8 C) 98.2 F (36.8 C) 99.6 F (37.6 C) 99.5 F (37.5 C)  TempSrc: Oral Oral Oral Oral  SpO2: 91% 91% 91% 100%  Weight:   105.8 kg   Height:        Intake/Output Summary (Last 24 hours) at 01/26/2024 0816 Last data filed at 01/26/2024 0527 Gross per 24 hour  Intake 720 ml  Output 1150 ml  Net -430 ml   Filed Weights   01/24/24 0600 01/25/24 0446 01/26/24 0520  Weight: 106.2 kg 105.3 kg 105.8 kg    Physical Exam   GEN: No acute distress.  Obese male Neck: +JVD Cardiac: RRR, distant heart sounds Respiratory: Decreased breath sounds bilaterally. GI: Soft, nontender, distended with fluid wave MS: skin is warm, minimal edema  Labs   Telemetry: SR and SBRAD  Chemistry Recent Labs  Lab 01/24/24 0312 01/25/24 0359 01/26/24 0257  NA 135 136 134*  K 3.5 3.8 3.5  CL 86* 88* 89*  CO2 36* 35* 34*  GLUCOSE 147* 157* 120*  BUN 36* 34* 36*  CREATININE 1.76* 1.73* 1.89*  CALCIUM 9.2 9.4 8.9  GFRNONAA 38* 39* 35*  ANIONGAP 13 13 11      Hematology Recent Labs  Lab 01/24/24 0312 01/25/24 0359 01/26/24 0257  WBC 8.7 9.2 10.4  RBC 3.05* 3.32* 3.00*  HGB 8.8* 9.5* 8.5*  HCT 28.0* 30.8* 27.3*  MCV 91.8 92.8 91.0  MCH 28.9 28.6 28.3  MCHC 31.4 30.8 31.1  RDW 18.4* 18.4* 18.3*  PLT 538* 640* 533*    Cardiac Studies   Cardiac Studies & Procedures   ______________________________________________________________________________________________ CARDIAC CATHETERIZATION  CARDIAC CATHETERIZATION 03/17/2015  Narrative Images from the original result were not  included.  Ost 1st Diag lesion, 100% stenosed.  Prox LAD to Mid LAD lesion, 50% stenosed.  RPDA lesion, 40% stenosed.  MOISHY LADAY is a 83 y.o. male   [id number] LOCATION:  FACILITY: [facility] PHYSICIAN: Lucas Dudley, M.D. [birth year]   DATE OF PROCEDURE:  03/17/2015  DATE OF DISCHARGE:     CARDIAC CATHETERIZATION    History obtained from chart review.The patient is a 83 year old, mild to mildly overweight, married Caucasian male, father of 1, grandfather to 2 grandchildren who I last saw in the office /5/16. He is retired from [employer]. His risk factors include hypertension, hyperlipidemia, as well as diabetes and remote tobacco abuse having quit 25 years ago. His mother did die of an MI at age 18. He has never had a heart attack or stroke. His last stress test was performed approximately 3 years ago was nonischemic. Since I saw him 6 months ago he developed dyspnea on exertion and some orthopnea several weeks ago which has ultimately improved with the addition of an oral diuretic. He does get occasional chest tightness. Recent Myoview stress test performed 02/09/15 showed inferior scar with moderate peri-infarct ischemia and a 2-D echo showed an EF of 40% which represents a decline from 50-55% by echo back in 2010. An event Monitor showed sick sinus syndrome/PAF  with pauses up to 2.5 seconds. He presents today for outpatient diagnostic coronary arteriography via the right radial approach to define his anatomy and rule out an ischemic etiology      IMPRESSION:Mr. Rosselli has noncritical CAD with severe LV dysfunction. I think that his inferior perfusion abnormality was artifactual. I do not think his moderate proximal LAD lesion is hemostatically significant. 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 The patient received 5000 units of heparin intravenously. He received radial cocktail. The SideArm sheath. A total of 55 mL of contrast was used for  the case. The sheath was removed and a TR band was placed on the right wrist to achieve patent hemostasis. The patient left the lab in stable condition. He'll be discharged home in 2 hours and will follow-up with me in the office in several weeks.  Lucas Dudley. MD, [facility] 03/17/2015 12:46 PM  Findings Coronary Findings Diagnostic  Dominance: Right  Left Anterior Descending The lesion is type non-C, tubular .  The lesion was not previously treated.  Pressure wire/FFR was not performed on the lesionIVUS was not performed on the lesion.  First Diagonal Branch  Right Coronary Artery  Right Posterior Descending Artery  Intervention  No interventions have been documented.   STRESS TESTS  MYOCARDIAL PERFUSION IMAGING 02/02/2020  Narrative  Nuclear stress EF: 31%.  There was no ST segment deviation noted during stress.  Findings consistent with prior myocardial infarction.  This is a high risk study.  The left ventricular ejection fraction is moderately decreased (30-44%).  Low exercise tolerance (3 METs), unable to reach target heart rate, so study converted to pharmacologic. Baseline blood pressure very elevated at baseline (198/96). There are diffuse perfusion defects at rest and with stress, sparing only the mid-lateral wall. There is no significant reversibility on this study. Based on distribution, would be concerned that this is artifact. However, with low EF, suggests diffuse areas of poor perfusion without significant reversibility.   ECHOCARDIOGRAM  ECHOCARDIOGRAM COMPLETE 12/15/2023  Narrative ECHOCARDIOGRAM REPORT    Patient Name:   Lucas Dudley Date of Exam: 12/15/2023 Medical Rec #:  [id number]     Height:       70.0 in Accession #:    [id number]    Weight:       251.1 lb Date of Birth:  05/13/1941      BSA:          2.299 m Patient Age:    82 years      BP:           155/75 mmHg Patient Gender: M             HR:           64 bpm. Exam Location:  [facility]  Procedure: 2D Echo, Cardiac Doppler, Color Doppler and Intracardiac Opacification Agent (Both Spectral and Color Flow Doppler were utilized during procedure).  Indications:    CHF  History:        Patient has prior history of Echocardiogram examinations, most recent 06/06/2022. Cardiomyopathy, CAD; Risk Factors:Sleep Apnea, Former Smoker, Hypertension and Diabetes.  Sonographer:    Adelia Homestead RVT RCS Referring Phys: [id number] EJIROGHENE E EMOKPAE  IMPRESSIONS   1. Left ventricular ejection fraction, by estimation, is 55 to 60%. Left ventricular ejection fraction by PLAX is 59 %. The left ventricle has normal function. The left ventricle has no regional wall motion abnormalities. There is moderate concentric left ventricular  hypertrophy. Left ventricular diastolic parameters are indeterminate. 2. Right ventricular systolic function is mildly reduced. The right ventricular size is normal. There is severely elevated pulmonary artery systolic pressure. The estimated right ventricular systolic pressure is 62.6 mmHg. 3. The mitral valve is grossly normal. No evidence of mitral valve regurgitation. 4. The aortic valve is tricuspid. Aortic valve regurgitation is not visualized. Aortic valve sclerosis/calcification is present, without any evidence of aortic stenosis. 5. The inferior vena cava is dilated in size with <50% respiratory variability, suggesting right atrial pressure of 15 mmHg.  Comparison(s): Changes from prior study are noted. 06/06/2022: LVEF 60-65%, mild LVH, low normal RV function.  FINDINGS Left Ventricle: Left ventricular ejection fraction, by estimation, is 55 to 60%. Left ventricular ejection fraction by PLAX is 59 %. The left ventricle has normal function. The left ventricle has no regional wall motion abnormalities. Definity contrast agent was given IV to delineate the left ventricular endocardial borders. Strain imaging was not performed. The left ventricular internal  cavity size was normal in size. There is moderate concentric left ventricular hypertrophy. Left ventricular diastolic parameters are indeterminate.  Right Ventricle: The right ventricular size is normal. No increase in right ventricular wall thickness. Right ventricular systolic function is mildly reduced. There is severely elevated pulmonary artery systolic pressure. The tricuspid regurgitant velocity is 3.45 m/s, and with an assumed right atrial pressure of 15 mmHg, the estimated right ventricular systolic pressure is 62.6 mmHg.  Left Atrium: Left atrial size was normal in size.  Right Atrium: Right atrial size was normal in size.  Pericardium: Trivial pericardial effusion is present. The pericardial effusion is posterior to the left ventricle.  Mitral Valve: The mitral valve is grossly normal. No evidence of mitral valve regurgitation.  Tricuspid Valve: The tricuspid valve is not well visualized. Tricuspid valve regurgitation is not demonstrated.  Aortic Valve: The aortic valve is tricuspid. Aortic valve regurgitation is not visualized. Aortic valve sclerosis/calcification is present, without any evidence of aortic stenosis. Aortic valve mean gradient measures 4.0 mmHg. Aortic valve peak gradient measures 6.8 mmHg. Aortic valve area, by VTI measures 1.59 cm.  Pulmonic Valve: The pulmonic valve was not well visualized. Pulmonic valve regurgitation is not visualized.  Aorta: The aortic root and ascending aorta are structurally normal, with no evidence of dilitation.  Venous: The inferior vena cava is dilated in size with less than 50% respiratory variability, suggesting right atrial pressure of 15 mmHg.  IAS/Shunts: No atrial level shunt detected by color flow Doppler.  Additional Comments: 3D imaging was not performed.   LEFT VENTRICLE PLAX 2D LV EF:         Left            Diastology ventricular     LV e' medial:    5.81 cm/s ejection        LV E/e' medial:  13.9 fraction by      LV e' lateral:   8.81 cm/s PLAX is 59      LV E/e' lateral: 9.1 %. LVIDd:         5.55 cm LVIDs:         3.80 cm LV PW:         1.65 cm LV IVS:        1.75 cm LVOT diam:     2.20 cm LV SV:         46 LV SV Index:   20 LVOT Area:     3.80 cm   RIGHT VENTRICLE  IVC RV Basal diam:  4.40 cm    IVC diam: 2.40 cm RV S prime:     8.93 cm/s TAPSE (M-mode): 1.8 cm  LEFT ATRIUM             Index        RIGHT ATRIUM           Index LA diam:        4.30 cm 1.87 cm/m   RA Area:     20.00 cm LA Vol (A2C):   73.8 ml 32.10 ml/m  RA Volume:   58.90 ml  25.62 ml/m LA Vol (A4C):   54.1 ml 23.53 ml/m LA Biplane Vol: 64.3 ml 27.97 ml/m AORTIC VALVE                    PULMONIC VALVE AV Area (Vmax):    1.83 cm     PV Vmax:       0.51 m/s AV Area (Vmean):   1.71 cm     PV Peak grad:  1.1 mmHg AV Area (VTI):     1.59 cm AV Vmax:           130.00 cm/s AV Vmean:          86.900 cm/s AV VTI:            0.289 m AV Peak Grad:      6.8 mmHg AV Mean Grad:      4.0 mmHg LVOT Vmax:         62.60 cm/s LVOT Vmean:        39.000 cm/s LVOT VTI:          0.121 m LVOT/AV VTI ratio: 0.42  AORTA Ao Root diam: 3.00 cm Ao Asc diam:  3.60 cm  MITRAL VALVE               TRICUSPID VALVE MV Area (PHT): 4.41 cm    TR Peak grad:   47.6 mmHg MV Decel Time: 172 msec    TR Vmax:        345.00 cm/s MV E velocity: 80.60 cm/s MV A velocity: 16.30 cm/s  SHUNTS MV E/A ratio:  4.94        Systemic VTI:  0.12 m Systemic Diam: 2.20 cm  Dinah Franco MD Electronically signed by Dinah Franco MD Signature Date/Time: 12/15/2023/11:41:54 AM    Final   TEE  ECHO TEE 06/28/2022  Narrative TRANSESOPHOGEAL ECHO REPORT    Patient Name:   Seabron Cypress Date of Exam: 06/28/2022 Medical Rec #:  [id number]     Height:       70.0 in Accession #:    [id number]    Weight:       239.0 lb Date of Birth:  08/31/1941      BSA:          2.251 m Patient Age:    81 years      BP:           182/82 mmHg Patient  Gender: M             HR:           65 bpm. Exam Location:  Inpatient  Procedure: Transesophageal Echo, 3D Echo, Color Doppler and Cardiac Doppler  Indications:     I48.91* Unspecified atrial fibrillation  History:         Patient has prior history of Echocardiogram examinations, most recent 06/06/2022. Arrythmias:Atrial Fibrillation; Risk Factors:Hypertension, Diabetes, Dyslipidemia  and Sleep Apnea.  Sonographer:     Sherline Distel Senior RDCS Referring Phys:  [phone] MICHAEL COOPER Diagnosing Phys: Luana Rumple MD   Sonographer Comments: Watchman Procedure   PROCEDURE: After discussion of the risks and benefits of a TEE, an informed consent was obtained from the patient. The transesophogeal probe was passed without difficulty through the esophogus of the patient. Sedation performed by different physician. The patient was monitored while under deep sedation. The patient developed no complications during the procedure.  TEE (including live 3D imaging and 3D postprocessing) was used to guide transseptal puncture, direct device deployment and evaluate the final result and screen for complications.  IMPRESSIONS   1. Left ventricular ejection fraction, by estimation, is 55 to 60%. The left ventricle has normal function. The left ventricle has no regional wall motion abnormalities. 2. Right ventricular systolic function is normal. The right ventricular size is normal. Tricuspid regurgitation signal is inadequate for assessing PA pressure. 3. After the procedure there is a well-seated 31 mm Watchman FLX appendage occlusion device, with adequate compression and no evidence of peridevice leak. After the procedure there is a tiny atrial septal defect in the fossa ovalis, with exclusively left-to-right shunt. Left atrial size was mild to moderately dilated. No left atrial/left atrial appendage thrombus was detected. 4. Right atrial size was mildly dilated. 5. The mitral valve is normal in structure. Mild  mitral valve regurgitation. 6. The aortic valve is tricuspid. Aortic valve regurgitation is not visualized. No aortic stenosis is present. 7. There is mild (Grade II) layered plaque.  FINDINGS Left Ventricle: Left ventricular ejection fraction, by estimation, is 55 to 60%. The left ventricle has normal function. The left ventricle has no regional wall motion abnormalities. The left ventricular internal cavity size was normal in size. There is no left ventricular hypertrophy.  Right Ventricle: The right ventricular size is normal. No increase in right ventricular wall thickness. Right ventricular systolic function is normal. Tricuspid regurgitation signal is inadequate for assessing PA pressure.  Left Atrium: After the procedure there is a well-seated 31 mm Watchman FLX appendage occlusion device, with adequate compression and no evidence of peridevice leak. After the procedure there is a tiny atrial septal defect in the fossa ovalis, with exclusively left-to-right shunt. Left atrial size was mild to moderately dilated. No left atrial/left atrial appendage thrombus was detected.  Right Atrium: Right atrial size was mildly dilated.  Pericardium: There is no evidence of pericardial effusion.  Mitral Valve: The mitral valve is normal in structure. Mild mitral valve regurgitation, with centrally-directed jet.  Tricuspid Valve: The tricuspid valve is normal in structure. Tricuspid valve regurgitation is trivial.  Aortic Valve: The aortic valve is tricuspid. Aortic valve regurgitation is not visualized. No aortic stenosis is present.  Pulmonic Valve: The pulmonic valve was normal in structure. Pulmonic valve regurgitation is mild.  Aorta: The aortic root, ascending aorta, aortic arch and descending aorta are all structurally normal, with no evidence of dilitation or obstruction. There is mild (Grade II) layered plaque.  IAS/Shunts: The interatrial septum appears to be lipomatous.  Luana Rumple MD Electronically signed by Luana Rumple MD Signature Date/Time: 06/28/2022/1:25:16 PM    Final  MONITORS  CARDIAC EVENT MONITOR 02/16/2015  Narrative  Paroxysmal atrial fib  The patient was in atrial fib for a significant amount of the monitored time. Many days he had atrial fib for almost the entire day. He had several pauses of > 2 seconds. 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  = - Consented for [facility] 01/24/24 (See Dr. Jesus Morones Note) NPO at midnight   Pulmonary hypertension Moderate pulmonary hypertension with a mixed picture in nature (WHO II -III suspected obstructive lung disease, HFpEF), Reveal Lite 2 Score 11 with echo evidence of RV-PA uncoupling - 60 IV lasix X1  Coronary artery disease - Chronic coronary artery disease. Scheduled for left and right heart catheterization to assess coronary status; asymptomatic  Atrial fibrillation - Persistent atrial fibrillation status post Watchman implant. Currently managed with amiodarone 200 mg. Recent EKG shows sinus rhythm.  SR on telemetry - Continue amiodarone 200 mg for atrial fibrillation management.  Chronic kidney disease - Blood pressure and kidney disease have not tolerated MRA.  - Stable - Consider finerinone as an outpatient if appropriate. - continue SGLT2i - pending RHC  Type 2 diabetes mellitus - managed by TRH  Iron deficiency anemia - Hgb fluctuating around 8 - Continue aspirin 81 mg.  Obstructive sleep apnea - CPAP is recommended   For questions or updates, please contact [facility] Please consult [email or web address] for contact  info under Cardiology/STEMI.      Gloriann Larger, MD FASE [facility] Cardiologist [facility]  [address] [place], [address] [place], [place] [address] [phone]  8:16 AM

## 2024-01-27 ENCOUNTER — Other Ambulatory Visit

## 2024-01-27 ENCOUNTER — Encounter (HOSPITAL_COMMUNITY): Payer: Self-pay | Admitting: Internal Medicine

## 2024-01-27 ENCOUNTER — Encounter (HOSPITAL_COMMUNITY)
Admission: EM | Disposition: A | Discharge status: Home / Self Care | Payer: Self-pay | Source: Home / Self Care | Attending: Internal Medicine

## 2024-01-27 DIAGNOSIS — J9601 Acute respiratory failure with hypoxia: Secondary | ICD-10-CM | POA: Diagnosis not present

## 2024-01-27 DIAGNOSIS — I4819 Other persistent atrial fibrillation: Secondary | ICD-10-CM | POA: Diagnosis not present

## 2024-01-27 DIAGNOSIS — I272 Pulmonary hypertension, unspecified: Secondary | ICD-10-CM | POA: Diagnosis not present

## 2024-01-27 DIAGNOSIS — I5030 Unspecified diastolic (congestive) heart failure: Secondary | ICD-10-CM | POA: Diagnosis not present

## 2024-01-27 DIAGNOSIS — I2510 Atherosclerotic heart disease of native coronary artery without angina pectoris: Secondary | ICD-10-CM | POA: Diagnosis not present

## 2024-01-27 HISTORY — PX: RIGHT/LEFT HEART CATH AND CORONARY ANGIOGRAPHY: CATH118266

## 2024-01-27 LAB — POCT I-STAT 7, (LYTES, BLD GAS, ICA,H+H)
Acid-Base Excess: 9 mmol/L — ABNORMAL HIGH (ref 0.0–2.0)
Bicarbonate: 34.8 mmol/L — ABNORMAL HIGH (ref 20.0–28.0)
Calcium, Ion: 1.1 mmol/L — ABNORMAL LOW (ref 1.15–1.40)
HCT: 27 % — ABNORMAL LOW (ref 39.0–52.0)
Hemoglobin: 9.2 g/dL — ABNORMAL LOW (ref 13.0–17.0)
O2 Saturation: 84 %
Potassium: 3.8 mmol/L (ref 3.5–5.1)
Sodium: 134 mmol/L — ABNORMAL LOW (ref 135–145)
TCO2: 36 mmol/L — ABNORMAL HIGH (ref 22–32)
pCO2 arterial: 51.9 mmHg — ABNORMAL HIGH (ref 32–48)
pH, Arterial: 7.434 (ref 7.35–7.45)
pO2, Arterial: 48 mmHg — ABNORMAL LOW (ref 83–108)

## 2024-01-27 LAB — POCT I-STAT EG7
Acid-Base Excess: 10 mmol/L — ABNORMAL HIGH (ref 0.0–2.0)
Acid-Base Excess: 11 mmol/L — ABNORMAL HIGH (ref 0.0–2.0)
Bicarbonate: 36.1 mmol/L — ABNORMAL HIGH (ref 20.0–28.0)
Bicarbonate: 37.1 mmol/L — ABNORMAL HIGH (ref 20.0–28.0)
Calcium, Ion: 1.13 mmol/L — ABNORMAL LOW (ref 1.15–1.40)
Calcium, Ion: 1.13 mmol/L — ABNORMAL LOW (ref 1.15–1.40)
HCT: 29 % — ABNORMAL LOW (ref 39.0–52.0)
HCT: 29 % — ABNORMAL LOW (ref 39.0–52.0)
Hemoglobin: 9.9 g/dL — ABNORMAL LOW (ref 13.0–17.0)
Hemoglobin: 9.9 g/dL — ABNORMAL LOW (ref 13.0–17.0)
O2 Saturation: 46 %
O2 Saturation: 46 %
Potassium: 3.8 mmol/L (ref 3.5–5.1)
Potassium: 3.9 mmol/L (ref 3.5–5.1)
Sodium: 133 mmol/L — ABNORMAL LOW (ref 135–145)
Sodium: 134 mmol/L — ABNORMAL LOW (ref 135–145)
TCO2: 38 mmol/L — ABNORMAL HIGH (ref 22–32)
TCO2: 39 mmol/L — ABNORMAL HIGH (ref 22–32)
pCO2, Ven: 56 mmHg (ref 44–60)
pCO2, Ven: 56.1 mmHg (ref 44–60)
pH, Ven: 7.417 (ref 7.25–7.43)
pH, Ven: 7.429 (ref 7.25–7.43)
pO2, Ven: 25 mmHg — CL (ref 32–45)
pO2, Ven: 26 mmHg — CL (ref 32–45)

## 2024-01-27 LAB — BASIC METABOLIC PANEL WITH GFR
Anion gap: 12 (ref 5–15)
BUN: 34 mg/dL — ABNORMAL HIGH (ref 8–23)
CO2: 32 mmol/L (ref 22–32)
Calcium: 8.6 mg/dL — ABNORMAL LOW (ref 8.9–10.3)
Chloride: 90 mmol/L — ABNORMAL LOW (ref 98–111)
Creatinine, Ser: 1.69 mg/dL — ABNORMAL HIGH (ref 0.61–1.24)
GFR, Estimated: 40 mL/min — ABNORMAL LOW (ref >60)
Glucose, Bld: 73 mg/dL (ref 70–99)
Potassium: 3.1 mmol/L — ABNORMAL LOW (ref 3.5–5.1)
Sodium: 134 mmol/L — ABNORMAL LOW (ref 135–145)

## 2024-01-27 LAB — CBC
HCT: 26.1 % — ABNORMAL LOW (ref 39.0–52.0)
Hemoglobin: 8.2 g/dL — ABNORMAL LOW (ref 13.0–17.0)
MCH: 28.5 pg (ref 26.0–34.0)
MCHC: 31.4 g/dL (ref 30.0–36.0)
MCV: 90.6 fL (ref 80.0–100.0)
Platelets: 534 10*3/uL — ABNORMAL HIGH (ref 150–400)
RBC: 2.88 MIL/uL — ABNORMAL LOW (ref 4.22–5.81)
RDW: 17.8 % — ABNORMAL HIGH (ref 11.5–15.5)
WBC: 7.3 10*3/uL (ref 4.0–10.5)
nRBC: 0 % (ref 0.0–0.2)

## 2024-01-27 LAB — GLUCOSE, CAPILLARY
Glucose-Capillary: 77 mg/dL (ref 70–99)
Glucose-Capillary: 95 mg/dL (ref 70–99)
Glucose-Capillary: 95 mg/dL (ref 70–99)
Glucose-Capillary: 208 mg/dL — ABNORMAL HIGH (ref 70–99)
Glucose-Capillary: 192 mg/dL — ABNORMAL HIGH (ref 70–99)

## 2024-01-27 SURGERY — RIGHT/LEFT HEART CATH AND CORONARY ANGIOGRAPHY
Anesthesia: LOCAL

## 2024-01-27 MED ORDER — FENTANYL CITRATE (PF) 100 MCG/2ML IJ SOLN
INTRAMUSCULAR | Status: DC | PRN
  Administered 2024-01-27: 25 ug via INTRAVENOUS

## 2024-01-27 MED ORDER — POTASSIUM CHLORIDE CRYS ER 20 MEQ PO TBCR
60.0000 meq | EXTENDED_RELEASE_TABLET | Freq: Once | ORAL | Status: AC
  Administered 2024-01-27: 60 meq via ORAL
  Filled 2024-01-27: qty 6

## 2024-01-27 MED ORDER — VERAPAMIL HCL 2.5 MG/ML IV SOLN
INTRAVENOUS | Status: DC | PRN
  Administered 2024-01-27: 10 mL via INTRA_ARTERIAL

## 2024-01-27 MED ORDER — VERAPAMIL HCL 2.5 MG/ML IV SOLN
INTRAVENOUS | Status: AC
  Filled 2024-01-27: qty 2

## 2024-01-27 MED ORDER — LIDOCAINE HCL (PF) 1 % IJ SOLN
INTRAMUSCULAR | Status: AC
  Filled 2024-01-27: qty 30

## 2024-01-27 MED ORDER — POTASSIUM CHLORIDE 10 MEQ/100ML IV SOLN
10.0000 meq | INTRAVENOUS | Status: AC
  Administered 2024-01-27 (×2): 10 meq via INTRAVENOUS
  Filled 2024-01-27 (×2): qty 100

## 2024-01-27 MED ORDER — LIDOCAINE HCL (PF) 1 % IJ SOLN
INTRAMUSCULAR | Status: DC | PRN
  Administered 2024-01-27: 5 mL

## 2024-01-27 MED ORDER — MIDAZOLAM HCL 2 MG/2ML IJ SOLN
INTRAMUSCULAR | Status: AC
Start: 2024-01-27 — End: ?
  Filled 2024-01-27: qty 2

## 2024-01-27 MED ORDER — SPIRONOLACTONE 25 MG PO TABS
25.0000 mg | ORAL_TABLET | Freq: Every day | ORAL | Status: DC
  Administered 2024-01-27 – 2024-01-28 (×2): 25 mg via ORAL
  Filled 2024-01-27 (×2): qty 1

## 2024-01-27 MED ORDER — MIDAZOLAM HCL 2 MG/2ML IJ SOLN
INTRAMUSCULAR | Status: DC | PRN
  Administered 2024-01-27: 1 mg via INTRAVENOUS

## 2024-01-27 MED ORDER — POTASSIUM CHLORIDE 20 MEQ PO PACK: 20.0000 meq | PACK | Freq: Once | ORAL | Status: DC

## 2024-01-27 MED ORDER — HEPARIN (PORCINE) IN NACL 2000-0.9 UNIT/L-% IV SOLN
INTRAVENOUS | Status: DC | PRN
Start: 2024-01-27 — End: 2024-01-27
  Administered 2024-01-27: 1000 mL

## 2024-01-27 MED ORDER — IOHEXOL 350 MG/ML SOLN
INTRAVENOUS | Status: DC | PRN
  Administered 2024-01-27: 38 mL

## 2024-01-27 MED ORDER — HEPARIN SODIUM (PORCINE) 1000 UNIT/ML IJ SOLN
INTRAMUSCULAR | Status: DC | PRN
  Administered 2024-01-27: 5000 [IU] via INTRAVENOUS

## 2024-01-27 MED ORDER — FENTANYL CITRATE (PF) 100 MCG/2ML IJ SOLN
INTRAMUSCULAR | Status: AC
  Filled 2024-01-27: qty 2

## 2024-01-27 MED ORDER — HEPARIN SODIUM (PORCINE) 1000 UNIT/ML IJ SOLN
INTRAMUSCULAR | Status: AC
  Filled 2024-01-27: qty 10

## 2024-01-27 SURGICAL SUPPLY — 14 items
CATH 5FR JL3.5 JR4 ANG PIG MP (CATHETERS) IMPLANT
CATH BALLN WEDGE 5F 110CM (CATHETERS) IMPLANT
CATH INFINITI 5FR JL4 (CATHETERS) IMPLANT
DEVICE RAD COMP TR BAND LRG (VASCULAR PRODUCTS) IMPLANT
GLIDESHEATH SLEND SS 6F .021 (SHEATH) IMPLANT
GUIDEWIRE .025 260CM (WIRE) IMPLANT
GUIDEWIRE INQWIRE 1.5J.035X260 (WIRE) IMPLANT
INQWIRE 1.5J .035X260CM (WIRE) ×1 IMPLANT
PACK CARDIAC CATHETERIZATION (CUSTOM PROCEDURE TRAY) ×1 IMPLANT
PROTECTION STATION PRESSURIZED (MISCELLANEOUS) ×1 IMPLANT
SET ATX-X65L (MISCELLANEOUS) IMPLANT
SHEATH GLIDE SLENDER 4/5FR (SHEATH) IMPLANT
SHEATH PROBE COVER 6X72 (BAG) IMPLANT
STATION PROTECTION PRESSURIZED (MISCELLANEOUS) IMPLANT

## 2024-01-27 NOTE — Progress Notes (Signed)
 PROGRESS NOTE    Lucas Dudley  WUJ:[id number]  DOB: 11-Feb-1941  DOA: 01/13/2024 PCP: Anabel Halon, MD Outpatient Specialists:   Hospital course:  83/M with diastolic CHF, CAD, persistent A-fib with Watchman device, recurrent GI bleed, OSA, type 2 diabetes mellitus, iron deficiency anemia was admitted to [facility] with progressive dyspnea on exertion, hypoxia from heme-onc clinic with what appeared to be decompensated heart failure.  Patient was started on IV Lasix drip.  Transferred to [facility] for RHC/LHC, at this has been delayed on account of AKI/CKD   Subjective:  Patient is excited that he is going to go for his catheterization today, and is pleased that his kidney function has improved sufficiently to undergo the procedure.  Notes he feels okay, denies shortness of breath   Objective: Vitals:   01/27/24 1039 01/27/24 1044 01/27/24 1159 01/27/24 1610  BP: 115/63 115/63 (!) 119/55 (!) 109/56  Pulse: (!) 53 (!) 0 (!) 52 (!) 41  Resp: 15  20 20   Temp:   (!) 97.4 F (36.3 C) 98 F (36.7 C)  TempSrc:   Oral Oral  SpO2: 95%  100% 99%  Weight:      Height:        Intake/Output Summary (Last 24 hours) at 01/27/2024 1611 Last data filed at 01/27/2024 1515 Gross per 24 hour  Intake 986.46 ml  Output 600 ml  Net 386.46 ml   Filed Weights   01/25/24 0446 01/26/24 0520 01/27/24 0438  Weight: 105.3 kg 105.8 kg 105.7 kg     Exam:  General: Sitting in recliner NAD, speaking in full sentences with attentive daughter at bedside Eyes: sclera anicteric, conjuctiva mild injection bilaterally CVS: S1-S2, regular  Respiratory:  decreased air entry bilaterally secondary to decreased inspiratory effort, rales at bases  GI: NABS, soft, NT  LE: Warm and well-perfused Neuro: A/O x 3,  grossly nonfocal.  Psych: patient is logical and coherent, judgement and insight appear normal, mood and affect appropriate to situation.  Data Reviewed:  Basic Metabolic Panel: Recent Labs  Lab  01/21/24 0446 01/22/24 0539 01/23/24 0233 01/24/24 0312 01/25/24 0359 01/26/24 0257 01/27/24 0335 01/27/24 1019 01/27/24 1025  NA 137 135 135 135 136 134* 134* 134*  133* 134*  K 3.4* 3.5 3.4* 3.5 3.8 3.5 3.1* 3.9  3.8 3.8  CL 85* 85* 86* 86* 88* 89* 90*  --   --   CO2 41* 37* 35* 36* 35* 34* 32  --   --   GLUCOSE 145* 170* 198* 147* 157* 120* 73  --   --   BUN 32* 35* 38* 36* 34* 36* 34*  --   --   CREATININE 1.66* 1.74* 1.88* 1.76* 1.73* 1.89* 1.69*  --   --   CALCIUM 9.3 9.7 9.5 9.2 9.4 8.9 8.6*  --   --   MG 1.8 1.9 1.9  --   --   --   --   --   --     CBC: Recent Labs  Lab 01/23/24 0233 01/24/24 0312 01/25/24 0359 01/26/24 0257 01/27/24 0335 01/27/24 1019 01/27/24 1025  WBC 9.3 8.7 9.2 10.4 7.3  --   --   HGB 9.2* 8.8* 9.5* 8.5* 8.2* 9.9*  9.9* 9.2*  HCT 29.4* 28.0* 30.8* 27.3* 26.1* 29.0*  29.0* 27.0*  MCV 93.0 91.8 92.8 91.0 90.6  --   --   PLT 542* 538* 640* 533* 534*  --   --      Scheduled  Meds:  amiodarone  200 mg Oral QHS   aspirin  81 mg Oral Daily   brimonidine  1 drop Both Eyes BID   And   timolol  1 drop Both Eyes BID   dapagliflozin propanediol  10 mg Oral Daily   docusate sodium  100 mg Oral BID   enoxaparin (LOVENOX) injection  40 mg Subcutaneous Q24H   insulin aspart  0-15 Units Subcutaneous TID WC   insulin aspart  0-5 Units Subcutaneous QHS   insulin aspart  10 Units Subcutaneous TID WC   insulin glargine-yfgn  30 Units Subcutaneous QHS   pantoprazole  40 mg Oral Daily   polyethylene glycol  17 g Oral Daily   rosuvastatin  40 mg Oral Daily   spironolactone  25 mg Oral Daily   Continuous Infusions:   Assessment & Plan:   Acute hypoxic respiratory failure Decompensated HFpEF and HFrEF Pulmonary hypertension Likely undiagnosed COPD/OSA tribute into pulmonary hypertension Echo 3/25 noted EF 55-60%, indeterminate diastolic function, mildly reduced RV, severe pulmonary hypertension, PASP 63  Has responded well to Lasix drip, is  down 20 pounds Plan is for RHC/LHC today GDMT limited by CKD/AKI Decreased O2 sats after cath thought to be possibly secondary to COPD  Atrial fibrillation Watchman device placed September 2023 Off of anticoagulation Continue amiodarone  Chronic IDA secondary to GI bleeds IV iron per hematology oncology  DM2 Continue present management  CAD Results of catheter pending Continue aspirin  OSA Continue CPAP   DVT prophylaxis: Lovenox Code Status: Full Family Communication: Patient's daughter was at bedside throughout     Studies: No results found.  Principal Problem:   Acute hypoxic respiratory failure (HCC) Active Problems:   Acute on chronic combined systolic and diastolic CHF (congestive heart failure) (HCC) - dry Weight 109 kg/240.3 lbs. dry weight BNP of 111   Essential hypertension   Coronary artery disease involving native coronary artery of native heart with angina pectoris (HCC)   Paroxysmal atrial fibrillation (HCC)   Type 2 diabetes mellitus with other specified complication (HCC)   Persistent atrial fibrillation (HCC)   Acute on chronic heart failure with preserved ejection fraction (HCC)   Pulmonary hypertension, unspecified (HCC)   Right ventricular dysfunction, NYHA class 3   AKI (acute kidney injury) (HCC)   Type 2 diabetes mellitus with hyperglycemia, with long-term current use of insulin (HCC)   Iron deficiency anemia   Atherosclerosis of native coronary artery of native heart without angina pectoris   Acute heart failure with preserved ejection fraction (HFpEF) (HCC)     Lucas Dudley Lucas Dudley, [facility]  If 7PM-7AM, please contact night-coverage [email or web address]   LOS: 14 days

## 2024-01-27 NOTE — TOC Progression Note (Signed)
 Transition of Care [facility]) - Progression Note    Patient Details  Name: JSON KOELZER MRN: [id number] Date of Birth: May 10, 1941  Transition of Care [facility]) CM/SW Contact  Ronni Colace, RN Phone Number: 01/27/2024, 2:23 PM  Clinical Narrative:    Lawson Prey called to check on patient. Currently patient is on room air and they are aware they will not qualify for oxygen.   Expected Discharge Plan: Home/Self Care Barriers to Discharge: Continued Medical Work up  Expected Discharge Plan and Services In-house Referral: Clinical Social Work Discharge Planning Services: CM Consult   Living arrangements for the past 2 months: Single Family Home                                       Social Determinants of Health (SDOH) Interventions SDOH Screenings   Food Insecurity: Patient Declined (01/14/2024)  Housing: Patient Declined (01/14/2024)  Transportation Needs: Patient Declined (01/14/2024)  Utilities: Patient Declined (01/14/2024)  Alcohol Screen: Low Risk  (11/20/2023)  Depression (PHQ2-9): Low Risk  (12/31/2023)  Financial Resource Strain: Low Risk  (12/17/2023)  Physical Activity: Inactive (11/20/2023)  Social Connections: Patient Declined (01/14/2024)  Recent Concern: Social Connections - Moderately Isolated (11/20/2023)  Stress: No Stress Concern Present (11/20/2023)  Tobacco Use: Medium Risk (01/13/2024)  Health Literacy: Adequate Health Literacy (11/20/2023)    Readmission Risk Interventions    01/14/2024   10:05 AM 12/17/2023   10:19 AM 12/16/2023   12:34 PM  Readmission Risk Prevention Plan  Transportation Screening Complete Complete Complete  Home Care Screening  Complete Complete  Medication Review (RN CM)  Complete Complete  HRI or Home Care Consult Complete    Social Work Consult for Recovery Care Planning/Counseling Complete    Palliative Care Screening Not Applicable    Medication Review Oceanographer) Complete

## 2024-01-27 NOTE — Plan of Care (Signed)
  Problem: Education: Goal: Knowledge of General Education information will improve Description: Including pain rating scale, medication(s)/side effects and non-pharmacologic comfort measures Outcome: Progressing   Problem: Health Behavior/Discharge Planning: Goal: Ability to manage health-related needs will improve Outcome: Progressing   Problem: Clinical Measurements: Goal: Ability to maintain clinical measurements within normal limits will improve Outcome: Progressing Goal: Will remain free from infection Outcome: Progressing Goal: Diagnostic test results will improve Outcome: Progressing Goal: Respiratory complications will improve Outcome: Progressing Goal: Cardiovascular complication will be avoided Outcome: Progressing   Problem: Activity: Goal: Risk for activity intolerance will decrease Outcome: Progressing   Problem: Nutrition: Goal: Adequate nutrition will be maintained Outcome: Progressing   Problem: Coping: Goal: Level of anxiety will decrease Outcome: Progressing   Problem: Elimination: Goal: Will not experience complications related to bowel motility Outcome: Progressing Goal: Will not experience complications related to urinary retention Outcome: Progressing   Problem: Pain Managment: Goal: General experience of comfort will improve and/or be controlled Outcome: Progressing   Problem: Safety: Goal: Ability to remain free from injury will improve Outcome: Progressing   Problem: Skin Integrity: Goal: Risk for impaired skin integrity will decrease Outcome: Progressing   Problem: Education: Goal: Ability to describe self-care measures that may prevent or decrease complications (Diabetes Survival Skills Education) will improve Outcome: Progressing Goal: Individualized Educational Video(s) Outcome: Progressing   Problem: Coping: Goal: Ability to adjust to condition or change in health will improve Outcome: Progressing   Problem: Fluid  Volume: Goal: Ability to maintain a balanced intake and output will improve Outcome: Progressing   Problem: Health Behavior/Discharge Planning: Goal: Ability to identify and utilize available resources and services will improve Outcome: Progressing Goal: Ability to manage health-related needs will improve Outcome: Progressing   Problem: Metabolic: Goal: Ability to maintain appropriate glucose levels will improve Outcome: Progressing   Problem: Nutritional: Goal: Maintenance of adequate nutrition will improve Outcome: Progressing Goal: Progress toward achieving an optimal weight will improve Outcome: Progressing   Problem: Skin Integrity: Goal: Risk for impaired skin integrity will decrease Outcome: Progressing   Problem: Tissue Perfusion: Goal: Adequacy of tissue perfusion will improve Outcome: Progressing   Problem: Education: Goal: Understanding of CV disease, CV risk reduction, and recovery process will improve Outcome: Progressing Goal: Individualized Educational Video(s) Outcome: Progressing   Problem: Activity: Goal: Ability to return to baseline activity level will improve Outcome: Progressing   Problem: Cardiovascular: Goal: Ability to achieve and maintain adequate cardiovascular perfusion will improve Outcome: Progressing Goal: Vascular access site(s) Level 0-1 will be maintained Outcome: Progressing   Problem: Health Behavior/Discharge Planning: Goal: Ability to safely manage health-related needs after discharge will improve Outcome: Progressing   Problem: Education: Goal: Ability to demonstrate management of disease process will improve Outcome: Progressing Goal: Ability to verbalize understanding of medication therapies will improve Outcome: Progressing Goal: Individualized Educational Video(s) Outcome: Progressing   Problem: Activity: Goal: Capacity to carry out activities will improve Outcome: Progressing   Problem: Cardiac: Goal: Ability to  achieve and maintain adequate cardiopulmonary perfusion will improve Outcome: Progressing

## 2024-01-27 NOTE — Progress Notes (Signed)
 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 Patient was diuresed during admission and home lisinopril was held due to renal dysfunction. Discharge weight was 244.2 lbs. Patient reported to cardiology clinic on 01/02/2024 with complaints of dyspnea on exertion and fatigue.  Patient was recently admitted to [place] 01/07/2024 for planned small bowel endoscopy 2/2 iron deficiency anemia work up - 2 non-bleeding lesions found in duodenum.   Presented to the ED on 3/31 with worsening SOB, hypoxia, and orthopnea. Patient reports chronic but worsening leg swelling  In the ED, patient was hypoxic. JVD and BL leg swelling present on exam. Very mild increased work of breathing, crackles bibasilarly more prominent in the left lung base. CXR 3/31 showed small left pleural effusion, stable cardiac silhouette, left lung base atelectasis or infiltrate. Repeat CXR on 4/7 showed improved aeration to the left lung base. Most recent ECHO 12/15/2023 showed LVEF 55-60% (changed from 60-65% 05/2022) with normal LV function. Moderate concentric LVH. Mildly reduced RV systolic function. Normal MV. Aortic valve regurgitation not visualized. Sclerosis/calcification is present without any evidence of aortic stenosis. Cardiology consulted. Attempted d/ch on 4/6 with torsemide, but deferred due to hypoxia, SOB. IV lasix restarted s/p Metolazone 2.5mg  x3. Patient has rescheduled [facility] for Monday, 4/14 given continued renal impairment.   Patient has planned [facility] today. Family present in room. Daughter reports SOB has greatly improved. Patient  denies wheezing and orthopnea. No edema on exam today.    Current HF Medications: Diuretic: s/p IV lasix 60mg  x1 (last dose given on 4/13) SGLT2i: farxiga 10mg  daily Other: s/p Kcl x1   Prior to admission HF Medications: Diuretic: Torsemide 20mg  AM and 40mg  PM  Other: Hydralazine 10mg  TID, Kcl daily   Pertinent Lab Values: Serum creatinine admit 1.73>1.89>1.69 (BL 1.1-1.3) , BUN 34, Potassium 3.1, Sodium 134, BNP 327, Magnesium 1.9, A1c 8.3,   Vital Signs: Weight: 233 lbs (admission weight: 246 lbs) Blood pressure: 100-130s/50-80s  Heart rate: 50-60s I/O: net - yesterday; net -17.3L since admission RA  Medication Assistance / Insurance Benefits Check: Does the patient have prescription insurance?  Yes Type of insurance plan: Medicare   Outpatient Pharmacy:  Prior to admission outpatient pharmacy: [facility] [place] Is the patient willing to use [facility] TOC pharmacy at discharge? Yes Is the patient willing to transition their outpatient pharmacy to utilize a [facility] outpatient pharmacy?   No    Assessment: 1. Acute on chronic diastolic CHF (LVEF 55-60%), due to unknown etiology. NYHA class 1 symptoms. Strict I/Os and daily weights. Keep K>4 and Mg>2. Pitting edema improved. Carvedilol recently stopped in 10/2023 due to bradycardia. Lisinopril held upon discharge in 12/2023 due to renal dysfunction.  - Continue farxiga 10mg  daily - Consider MRA (spironolactone 12.5mg ) for HFpEF GDMT titration if renal function continues to improve   - Consider restarting IV/PO lasix if evidence of pitting edema on exam and post cath today   Plan: 1) Medication changes recommended at this time: - F/U [facility] on Monday - Monitor K trend after replacement - Monitor renal function  - Start spironolactone 12.5mg  daily pending cath results  2) Patient  assistance: - Patient is interested in obtaining discharge medications from [facility] pharmacy.  [name] copay: $30 [name] copay: $30  3)   Education  - Patient has been educated on current HF medications and potential additions to HF medication regimen - Patient verbalizes understanding that over the next few months, these medication doses may change and more medications may be added to optimize HF regimen - Patient has been educated on basic disease state pathophysiology and goals of therapy    [name], PharmD PGY1 Pharmacy Resident

## 2024-01-27 NOTE — Interval H&P Note (Signed)
 History and Physical Interval Note:  01/27/2024 10:01 AM  Lucas Dudley  has presented today for surgery, with the diagnosis of heart failure.  The various methods of treatment have been discussed with the patient and family. After consideration of risks, benefits and other options for treatment, the patient has consented to  Procedure(s): RIGHT/LEFT HEART CATH AND CORONARY ANGIOGRAPHY (N/A) as a surgical intervention.  The patient's history has been reviewed, patient examined, no change in status, stable for surgery.  I have reviewed the patient's chart and labs.  Questions were answered to the patient's satisfaction.     Lauralee Poll

## 2024-01-27 NOTE — Progress Notes (Signed)
 Heart Failure Nurse Navigator Progress Note  PCP: Meldon Sport, MD PCP-Cardiologist: Londa Rival Admission Diagnosis: None Admitted from: doctors office to ED  Presentation:   Lucas Dudley presented with shortness of breath, hypoxia sats 84-90% on RA. Chronic bilateral lower extremity edema, Recent hospitalization from 3/1-3/4 for decompensated CHF, hypoxia. He was recently seen in cardiology clinic 01/02/2024 with complaints of dyspnea on exertion and fatigue.  Per notes- patient with evidence of HFpEF, likely contributing, and RV dysfunction with evidence of potentially severe pulmonary hypertension by echo. 4/14 R/ L HC planned. BP 154/76, HR 56,   Patient educated on the sign and symptoms of heart failure, daily weights, when to call his doctor or go to the ED. Diet/ fluid restrictions ( patient verbalized that he eats out every day for meals) continued education on taking all medications as prescribed and attending all medical appointments.  Patient verbalized his understanding of all education, a HF TOC appointment was scheduled for 02/04/2024 @ 2:45 pm.   ECHO/ LVEF: 55-60%  Clinical Course:  Past Medical History:  Diagnosis Date   Arthritis    Atrial fibrillation (HCC)    CAD (coronary artery disease)    a. Cath 03/17/15 showing 100% ostial D1, 50% prox LAD to mid LAD, 40% RPDA stenosis. Med rx. // Myoview 01/2020: EF 31 diffuse perfusion defect without reversibility (suspect artifact); reviewed with Dr. Lupe Salk study felt to be low risk   Cataract    Mixed form OD   Chronic diastolic CHF (congestive heart failure) (HCC)    Chronically elevated hemidiaphragm - Right Side    Diabetic retinopathy (HCC)    NPDR OU   Essential hypertension    Glaucoma    POAG OU   History of gout    Hyperlipidemia    Hypertensive retinopathy    OU   Kidney stones    Melanoma of neck (HCC)    NICM (nonischemic cardiomyopathy) (HCC)    OSA on CPAP 2012   Prostate cancer (HCC)    Type II  diabetes mellitus (HCC)      Social History   Socioeconomic History   Marital status: Widowed    Spouse name: Not on file   Number of children: 1   Years of education: Not on file   Highest education level: Not on file  Occupational History   Occupation: Retired  Tobacco Use   Smoking status: Former    Current packs/day: 0.00    Average packs/day: 2.0 packs/day for 27.0 years (54.0 ttl pk-yrs)    Types: Cigarettes    Start date: 16    Quit date: 1992    Years since quitting: 33.3    Passive exposure: Current   Smokeless tobacco: Never   Tobacco comments:    Former smoker 09/27/21  Vaping Use   Vaping status: Never Used  Substance and Sexual Activity   Alcohol use: No    Alcohol/week: 0.0 standard drinks of alcohol    Comment: "used to drink; stopped ~ 2008"   Drug use: No   Sexual activity: Not Currently  Other Topics Concern   Not on file  Social History Narrative   Lives in [place], [place] with wife.   Social Drivers of Corporate investment banker Strain: Low Risk  (12/17/2023)   Overall Financial Resource Strain (CARDIA)    Difficulty of Paying Living Expenses: Not hard at all  Food Insecurity: Patient Declined (01/14/2024)   Hunger Vital Sign    Worried About Running Out of  Food in the Last Year: Patient declined    Ran Out of Food in the Last Year: Patient declined  Transportation Needs: Patient Declined (01/14/2024)   PRAPARE - Administrator, Civil Service (Medical): Patient declined    Lack of Transportation (Non-Medical): Patient declined  Physical Activity: Inactive (11/20/2023)   Exercise Vital Sign    Days of Exercise per Week: 0 days    Minutes of Exercise per Session: 0 min  Stress: No Stress Concern Present (11/20/2023)   [employer] of Occupational Health - Occupational Stress Questionnaire    Feeling of Stress : Not at all  Social Connections: Patient Declined (01/14/2024)   Social Connection and Isolation Panel [NHANES]    Frequency of  Communication with Friends and Family: Patient declined    Frequency of Social Gatherings with Friends and Family: Patient declined    Attends Religious Services: Patient declined    Database administrator or Organizations: Patient declined    Attends Banker Meetings: Patient declined    Marital Status: Patient declined  Recent Concern: Social Connections - Moderately Isolated (11/20/2023)   Social Connection and Isolation Panel [NHANES]    Frequency of Communication with Friends and Family: More than three times a week    Frequency of Social Gatherings with Friends and Family: Once a week    Attends Religious Services: More than 4 times per year    Active Member of [employer] or Organizations: No    Attends Banker Meetings: Never    Marital Status: Widowed   Education Assessment and Provision:  Detailed education and instructions provided on heart failure disease management including the following:  Signs and symptoms of Heart Failure When to call the physician Importance of daily weights Low sodium diet Fluid restriction Medication management Anticipated future follow-up appointments  Patient education given on each of the above topics.  Patient acknowledges understanding via teach back method and acceptance of all instructions.  Education Materials:  "Living Better With Heart Failure" Booklet, HF zone tool, & Daily Weight Tracker Tool.  Patient has scale at home: yes Patient has pill box at home: NA    High Risk Criteria for Readmission and/or Poor Patient Outcomes: Heart failure hospital admissions (last 6 months): 2  No Show rate: 2% Difficult social situation: No, lives alone Demonstrates medication adherence: yes Primary Language: English Literacy level: Reading, writing, and comprehension  Barriers of Care:   Diet/ fluid restrictions ( salt, eats out every day)   Considerations/Referrals:   Referral made to Heart Failure Pharmacist  Stewardship: Yes Referral made to Heart Failure CSW/NCM TOC: No Referral made to Heart & Vascular TOC clinic: Yes, 02/04/2024 @ 2:45 pm.   Items for Follow-up on DC/TOC: Diet/ fluid restrictions ( salt ) Continued HF education   Randie Bustle, BSN, RN Heart Failure Print production planner Chat Only

## 2024-01-27 NOTE — H&P (View-Only) (Signed)
 Progress Note  Patient Name: Lucas Dudley Date of Encounter: 01/27/2024 Primary Cardiologist: Nona Dell, MD   Subjective   Creatinine has improved with IV diuresis.  Planned for RHC/LHC today.  Vital Signs    Vitals:   01/27/24 0438 01/27/24 0445 01/27/24 0740 01/27/24 0748  BP: (!) 113/54  (!) 133/55   Pulse: (!) 56  (!) 54   Resp: 16 20 16 17   Temp: (!) 97.4 F (36.3 C)  (!) 97.5 F (36.4 C)   TempSrc: Oral  Oral   SpO2: 93%  90%   Weight: 105.7 kg     Height:        Intake/Output Summary (Last 24 hours) at 01/27/2024 0900 Last data filed at 01/27/2024 0444 Gross per 24 hour  Intake 870 ml  Output 1200 ml  Net -330 ml   Filed Weights   01/25/24 0446 01/26/24 0520 01/27/24 0438  Weight: 105.3 kg 105.8 kg 105.7 kg    Physical Exam   GEN: No acute distress.  Obese male Cardiac: RRR, distant heart sounds  Respiratory: Decreased breath sounds bilaterally. GI: Soft, nontender, distended with fluid wave MS: skin is warm  Labs   Telemetry: SR and SBRAD (off telemetry)  Chemistry Recent Labs  Lab 01/25/24 0359 01/26/24 0257 01/27/24 0335  NA 136 134* 134*  K 3.8 3.5 3.1*  CL 88* 89* 90*  CO2 35* 34* 32  GLUCOSE 157* 120* 73  BUN 34* 36* 34*  CREATININE 1.73* 1.89* 1.69*  CALCIUM 9.4 8.9 8.6*  GFRNONAA 39* 35* 40*  ANIONGAP 13 11 12      Hematology Recent Labs  Lab 01/25/24 0359 01/26/24 0257 01/27/24 0335  WBC 9.2 10.4 7.3  RBC 3.32* 3.00* 2.88*  HGB 9.5* 8.5* 8.2*  HCT 30.8* 27.3* 26.1*  MCV 92.8 91.0 90.6  MCH 28.6 28.3 28.5  MCHC 30.8 31.1 31.4  RDW 18.4* 18.3* 17.8*  PLT 640* 533* 534*    Cardiac Studies   Cardiac Studies & Procedures   ______________________________________________________________________________________________ CARDIAC CATHETERIZATION  CARDIAC CATHETERIZATION 03/17/2015  Narrative Images from the original result were not included.  Ost 1st Diag lesion, 100% stenosed.  Prox LAD to Mid LAD lesion, 50%  stenosed.  RPDA lesion, 40% stenosed.  [name] is a 83 y.o. male   [id number] LOCATION:  FACILITY: [facility] PHYSICIAN: Nanetta Batty, M.D. 1941-03-09   DATE OF PROCEDURE:  03/17/2015  DATE OF DISCHARGE:     CARDIAC CATHETERIZATION    History obtained from chart review.The patient is a 83 year old, mild to mildly overweight, married Caucasian male, father of 1, grandfather to 2 grandchildren who I last saw in the office /5/16. He is retired from [employer]. His risk factors include hypertension, hyperlipidemia, as well as diabetes and remote tobacco abuse having quit 25 years ago. His mother did die of an MI at age 49. He has never had a heart attack or stroke. His last stress test was performed approximately 3 years ago was nonischemic. Since I saw him 6 months ago he developed dyspnea on exertion and some orthopnea several weeks ago which has ultimately improved with the addition of an oral diuretic. He does get occasional chest tightness. Recent Myoview stress test performed 02/09/15 showed inferior scar with moderate peri-infarct ischemia and a 2-D echo showed an EF of 40% which represents a decline from 50-55% by echo back in 2010. An event Monitor showed sick sinus syndrome/PAF with pauses up to 2.5 seconds. He presents today for outpatient diagnostic coronary  arteriography via the right radial approach to define his anatomy and rule out an ischemic etiology      IMPRESSION:Mr. Oxley has noncritical CAD with severe LV dysfunction. I think that his inferior perfusion abnormality was artifactual. I do not think his moderate proximal LAD lesion is hemostatically significant. 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 The patient received 5000 units of heparin intravenously. He received radial cocktail. The SideArm sheath. A total of 55 mL of contrast was used for the case. The sheath was removed and a TR band was placed on the right wrist to  achieve patent hemostasis. The patient left the lab in stable condition. He'll be discharged home in 2 hours and will follow-up with me in the office in several weeks.  Lucas Dudley. MD, [facility] 03/17/2015 12:46 PM  Findings Coronary Findings Diagnostic  Dominance: Right  Left Anterior Descending The lesion is type non-C, tubular .  The lesion was not previously treated.  Pressure wire/FFR was not performed on the lesionIVUS was not performed on the lesion.  First Diagonal Branch  Right Coronary Artery  Right Posterior Descending Artery  Intervention  No interventions have been documented.   STRESS TESTS  MYOCARDIAL PERFUSION IMAGING 02/02/2020  Narrative  Nuclear stress EF: 31%.  There was no ST segment deviation noted during stress.  Findings consistent with prior myocardial infarction.  This is a high risk study.  The left ventricular ejection fraction is moderately decreased (30-44%).  Low exercise tolerance (3 METs), unable to reach target heart rate, so study converted to pharmacologic. Baseline blood pressure very elevated at baseline (198/96). There are diffuse perfusion defects at rest and with stress, sparing only the mid-lateral wall. There is no significant reversibility on this study. Based on distribution, would be concerned that this is artifact. However, with low EF, suggests diffuse areas of poor perfusion without significant reversibility.   ECHOCARDIOGRAM  ECHOCARDIOGRAM COMPLETE 12/15/2023  Narrative ECHOCARDIOGRAM REPORT    Patient Name:   Lucas Dudley Date of Exam: 12/15/2023 Medical Rec #:  [id number]     Height:       70.0 in Accession #:    [id number]    Weight:       251.1 lb Date of Birth:  1941-02-17      BSA:          2.299 m Patient Age:    82 years      BP:           155/75 mmHg Patient Gender: M             HR:           64 bpm. Exam Location:  [facility]  Procedure: 2D Echo, Cardiac Doppler, Color Doppler and  Intracardiac Opacification Agent (Both Spectral and Color Flow Doppler were utilized during procedure).  Indications:    CHF  History:        Patient has prior history of Echocardiogram examinations, most recent 06/06/2022. Cardiomyopathy, CAD; Risk Factors:Sleep Apnea, Former Smoker, Hypertension and Diabetes.  Sonographer:    Adelia Homestead RVT RCS Referring Phys: [id number] EJIROGHENE E EMOKPAE  IMPRESSIONS   1. Left ventricular ejection fraction, by estimation, is 55 to 60%. Left ventricular ejection fraction by PLAX is 59 %. The left ventricle has normal function. The left ventricle has no regional wall motion abnormalities. There is moderate concentric left ventricular hypertrophy. Left ventricular diastolic parameters are indeterminate. 2. Right ventricular systolic function is  mildly reduced. The right ventricular size is normal. There is severely elevated pulmonary artery systolic pressure. The estimated right ventricular systolic pressure is 62.6 mmHg. 3. The mitral valve is grossly normal. No evidence of mitral valve regurgitation. 4. The aortic valve is tricuspid. Aortic valve regurgitation is not visualized. Aortic valve sclerosis/calcification is present, without any evidence of aortic stenosis. 5. The inferior vena cava is dilated in size with <50% respiratory variability, suggesting right atrial pressure of 15 mmHg.  Comparison(s): Changes from prior study are noted. 06/06/2022: LVEF 60-65%, mild LVH, low normal RV function.  FINDINGS Left Ventricle: Left ventricular ejection fraction, by estimation, is 55 to 60%. Left ventricular ejection fraction by PLAX is 59 %. The left ventricle has normal function. The left ventricle has no regional wall motion abnormalities. Definity contrast agent was given IV to delineate the left ventricular endocardial borders. Strain imaging was not performed. The left ventricular internal cavity size was normal in size. There is moderate concentric left  ventricular hypertrophy. Left ventricular diastolic parameters are indeterminate.  Right Ventricle: The right ventricular size is normal. No increase in right ventricular wall thickness. Right ventricular systolic function is mildly reduced. There is severely elevated pulmonary artery systolic pressure. The tricuspid regurgitant velocity is 3.45 m/s, and with an assumed right atrial pressure of 15 mmHg, the estimated right ventricular systolic pressure is 62.6 mmHg.  Left Atrium: Left atrial size was normal in size.  Right Atrium: Right atrial size was normal in size.  Pericardium: Trivial pericardial effusion is present. The pericardial effusion is posterior to the left ventricle.  Mitral Valve: The mitral valve is grossly normal. No evidence of mitral valve regurgitation.  Tricuspid Valve: The tricuspid valve is not well visualized. Tricuspid valve regurgitation is not demonstrated.  Aortic Valve: The aortic valve is tricuspid. Aortic valve regurgitation is not visualized. Aortic valve sclerosis/calcification is present, without any evidence of aortic stenosis. Aortic valve mean gradient measures 4.0 mmHg. Aortic valve peak gradient measures 6.8 mmHg. Aortic valve area, by VTI measures 1.59 cm.  Pulmonic Valve: The pulmonic valve was not well visualized. Pulmonic valve regurgitation is not visualized.  Aorta: The aortic root and ascending aorta are structurally normal, with no evidence of dilitation.  Venous: The inferior vena cava is dilated in size with less than 50% respiratory variability, suggesting right atrial pressure of 15 mmHg.  IAS/Shunts: No atrial level shunt detected by color flow Doppler.  Additional Comments: 3D imaging was not performed.   LEFT VENTRICLE PLAX 2D LV EF:         Left            Diastology ventricular     LV e' medial:    5.81 cm/s ejection        LV E/e' medial:  13.9 fraction by     LV e' lateral:   8.81 cm/s PLAX is 59      LV E/e' lateral:  9.1 %. LVIDd:         5.55 cm LVIDs:         3.80 cm LV PW:         1.65 cm LV IVS:        1.75 cm LVOT diam:     2.20 cm LV SV:         46 LV SV Index:   20 LVOT Area:     3.80 cm   RIGHT VENTRICLE            IVC RV  Basal diam:  4.40 cm    IVC diam: 2.40 cm RV S prime:     8.93 cm/s TAPSE (M-mode): 1.8 cm  LEFT ATRIUM             Index        RIGHT ATRIUM           Index LA diam:        4.30 cm 1.87 cm/m   RA Area:     20.00 cm LA Vol (A2C):   73.8 ml 32.10 ml/m  RA Volume:   58.90 ml  25.62 ml/m LA Vol (A4C):   54.1 ml 23.53 ml/m LA Biplane Vol: 64.3 ml 27.97 ml/m AORTIC VALVE                    PULMONIC VALVE AV Area (Vmax):    1.83 cm     PV Vmax:       0.51 m/s AV Area (Vmean):   1.71 cm     PV Peak grad:  1.1 mmHg AV Area (VTI):     1.59 cm AV Vmax:           130.00 cm/s AV Vmean:          86.900 cm/s AV VTI:            0.289 m AV Peak Grad:      6.8 mmHg AV Mean Grad:      4.0 mmHg LVOT Vmax:         62.60 cm/s LVOT Vmean:        39.000 cm/s LVOT VTI:          0.121 m LVOT/AV VTI ratio: 0.42  AORTA Ao Root diam: 3.00 cm Ao Asc diam:  3.60 cm  MITRAL VALVE               TRICUSPID VALVE MV Area (PHT): 4.41 cm    TR Peak grad:   47.6 mmHg MV Decel Time: 172 msec    TR Vmax:        345.00 cm/s MV E velocity: 80.60 cm/s MV A velocity: 16.30 cm/s  SHUNTS MV E/A ratio:  4.94        Systemic VTI:  0.12 m Systemic Diam: 2.20 cm  Dinah Franco MD Electronically signed by Dinah Franco MD Signature Date/Time: 12/15/2023/11:41:54 AM    Final   TEE  ECHO TEE 06/28/2022  Narrative TRANSESOPHOGEAL ECHO REPORT    Patient Name:   Seabron Cypress Date of Exam: 06/28/2022 Medical Rec #:  [id number]     Height:       70.0 in Accession #:    [id number]    Weight:       239.0 lb Date of Birth:  [birth year]      BSA:          2.251 m Patient Age:    81 years      BP:           182/82 mmHg Patient Gender: M             HR:           65 bpm. Exam Location:   Inpatient  Procedure: Transesophageal Echo, 3D Echo, Color Doppler and Cardiac Doppler  Indications:     I48.91* Unspecified atrial fibrillation  History:         Patient has prior history of Echocardiogram examinations, most recent 06/06/2022. Arrythmias:Atrial Fibrillation; Risk Factors:Hypertension, Diabetes, Dyslipidemia and Sleep  Apnea.  Sonographer:     Irving Burton Senior RDCS Referring Phys:  [id number] MICHAEL COOPER Diagnosing Phys: Thurmon Fair MD   Sonographer Comments: Watchman Procedure   PROCEDURE: After discussion of the risks and benefits of a TEE, an informed consent was obtained from the patient. The transesophogeal probe was passed without difficulty through the esophogus of the patient. Sedation performed by different physician. The patient was monitored while under deep sedation. The patient developed no complications during the procedure.  TEE (including live 3D imaging and 3D postprocessing) was used to guide transseptal puncture, direct device deployment and evaluate the final result and screen for complications.  IMPRESSIONS   1. Left ventricular ejection fraction, by estimation, is 55 to 60%. The left ventricle has normal function. The left ventricle has no regional wall motion abnormalities. 2. Right ventricular systolic function is normal. The right ventricular size is normal. Tricuspid regurgitation signal is inadequate for assessing PA pressure. 3. After the procedure there is a well-seated 31 mm Watchman FLX appendage occlusion device, with adequate compression and no evidence of peridevice leak. After the procedure there is a tiny atrial septal defect in the fossa ovalis, with exclusively left-to-right shunt. Left atrial size was mild to moderately dilated. No left atrial/left atrial appendage thrombus was detected. 4. Right atrial size was mildly dilated. 5. The mitral valve is normal in structure. Mild mitral valve regurgitation. 6. The aortic valve is  tricuspid. Aortic valve regurgitation is not visualized. No aortic stenosis is present. 7. There is mild (Grade II) layered plaque.  FINDINGS Left Ventricle: Left ventricular ejection fraction, by estimation, is 55 to 60%. The left ventricle has normal function. The left ventricle has no regional wall motion abnormalities. The left ventricular internal cavity size was normal in size. There is no left ventricular hypertrophy.  Right Ventricle: The right ventricular size is normal. No increase in right ventricular wall thickness. Right ventricular systolic function is normal. Tricuspid regurgitation signal is inadequate for assessing PA pressure.  Left Atrium: After the procedure there is a well-seated 31 mm Watchman FLX appendage occlusion device, with adequate compression and no evidence of peridevice leak. After the procedure there is a tiny atrial septal defect in the fossa ovalis, with exclusively left-to-right shunt. Left atrial size was mild to moderately dilated. No left atrial/left atrial appendage thrombus was detected.  Right Atrium: Right atrial size was mildly dilated.  Pericardium: There is no evidence of pericardial effusion.  Mitral Valve: The mitral valve is normal in structure. Mild mitral valve regurgitation, with centrally-directed jet.  Tricuspid Valve: The tricuspid valve is normal in structure. Tricuspid valve regurgitation is trivial.  Aortic Valve: The aortic valve is tricuspid. Aortic valve regurgitation is not visualized. No aortic stenosis is present.  Pulmonic Valve: The pulmonic valve was normal in structure. Pulmonic valve regurgitation is mild.  Aorta: The aortic root, ascending aorta, aortic arch and descending aorta are all structurally normal, with no evidence of dilitation or obstruction. There is mild (Grade II) layered plaque.  IAS/Shunts: The interatrial septum appears to be lipomatous.  Thurmon Fair MD Electronically signed by Thurmon Fair  MD Signature Date/Time: 06/28/2022/1:25:16 PM    Final  MONITORS  CARDIAC EVENT MONITOR 02/16/2015  Narrative  Paroxysmal atrial fib  The patient was in atrial fib for a significant amount of the monitored time. Many days he had atrial fib for almost the entire day. He had several pauses of > 2 seconds. 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  = - Consented for [facility] 01/24/24 (See Dr. Jesus Morones Note) NPO at midnight; on Dr. 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 Scheduled for left and right heart catheterization to assess coronary status; asymptomatic  Atrial fibrillation - Persistent atrial fibrillation status post Watchman implant. Currently managed with amiodarone 200 mg. Recent EKG shows sinus rhythm.  SR on telemetry - Continue amiodarone 200 mg for atrial fibrillation management.  Chronic kidney disease IIIb - Stable o improved - Consider finerinone as an outpatient if appropriate. - continue SGLT2i - pending RHC  Type 2 diabetes mellitus - managed by TRH  Iron deficiency anemia - Hgb fluctuating around 8 - Continue aspirin 81 mg.  Obstructive sleep apnea - CPAP is recommended  If no evidence of RV dysfunction and euvolemic, nearing hospital discharge (normal cath could leave today from a cardiac  standpoint)  For questions or updates, please contact [facility] Please consult [email or web address] for contact info under Cardiology/STEMI.      Gloriann Larger, MD FASE [facility] Cardiologist [facility]  [address] [place], [address] [place], [place] [address] [phone]  9:00 AM

## 2024-01-27 NOTE — Progress Notes (Addendum)
 Progress Note  Patient Name: Lucas Dudley Date of Encounter: 01/27/2024 Primary Cardiologist: Nona Dell, MD   Subjective   Creatinine has improved with IV diuresis.  Planned for RHC/LHC today.  Vital Signs    Vitals:   01/27/24 0438 01/27/24 0445 01/27/24 0740 01/27/24 0748  BP: (!) 113/54  (!) 133/55   Pulse: (!) 56  (!) 54   Resp: 16 20 16 17   Temp: (!) 97.4 F (36.3 C)  (!) 97.5 F (36.4 C)   TempSrc: Oral  Oral   SpO2: 93%  90%   Weight: 105.7 kg     Height:        Intake/Output Summary (Last 24 hours) at 01/27/2024 0900 Last data filed at 01/27/2024 0444 Gross per 24 hour  Intake 870 ml  Output 1200 ml  Net -330 ml   Filed Weights   01/25/24 0446 01/26/24 0520 01/27/24 0438  Weight: 105.3 kg 105.8 kg 105.7 kg    Physical Exam   GEN: No acute distress.  Obese male Cardiac: RRR, distant heart sounds  Respiratory: Decreased breath sounds bilaterally. GI: Soft, nontender, distended with fluid wave MS: skin is warm  Labs   Telemetry: SR and SBRAD (off telemetry)  Chemistry Recent Labs  Lab 01/25/24 0359 01/26/24 0257 01/27/24 0335  NA 136 134* 134*  K 3.8 3.5 3.1*  CL 88* 89* 90*  CO2 35* 34* 32  GLUCOSE 157* 120* 73  BUN 34* 36* 34*  CREATININE 1.73* 1.89* 1.69*  CALCIUM 9.4 8.9 8.6*  GFRNONAA 39* 35* 40*  ANIONGAP 13 11 12      Hematology Recent Labs  Lab 01/25/24 0359 01/26/24 0257 01/27/24 0335  WBC 9.2 10.4 7.3  RBC 3.32* 3.00* 2.88*  HGB 9.5* 8.5* 8.2*  HCT 30.8* 27.3* 26.1*  MCV 92.8 91.0 90.6  MCH 28.6 28.3 28.5  MCHC 30.8 31.1 31.4  RDW 18.4* 18.3* 17.8*  PLT 640* 533* 534*    Cardiac Studies   Cardiac Studies & Procedures   ______________________________________________________________________________________________ CARDIAC CATHETERIZATION  CARDIAC CATHETERIZATION 03/17/2015  Narrative Images from the original result were not included.  Ost 1st Diag lesion, 100% stenosed.  Prox LAD to Mid LAD lesion, 50%  stenosed.  RPDA lesion, 40% stenosed.  Lucas Dudley is a 83 y.o. male   [id number] LOCATION:  FACILITY: [facility] PHYSICIAN: Nanetta Batty, M.D. 27-Jan-1941   DATE OF PROCEDURE:  03/17/2015  DATE OF DISCHARGE:     CARDIAC CATHETERIZATION    History obtained from chart review.The patient is a 83 year old, mild to mildly overweight, married Caucasian male, father of 1, grandfather to 2 grandchildren who I last saw in the office /5/16. He is retired from [employer]. His risk factors include hypertension, hyperlipidemia, as well as diabetes and remote tobacco abuse having quit 25 years ago. His mother did die of an MI at age 52. He has never had a heart attack or stroke. His last stress test was performed approximately 3 years ago was nonischemic. Since I saw him 6 months ago he developed dyspnea on exertion and some orthopnea several weeks ago which has ultimately improved with the addition of an oral diuretic. He does get occasional chest tightness. Recent Myoview stress test performed 02/09/15 showed inferior scar with moderate peri-infarct ischemia and a 2-D echo showed an EF of 40% which represents a decline from 50-55% by echo back in 2010. An event Monitor showed sick sinus syndrome/PAF with pauses up to 2.5 seconds. He presents today for outpatient diagnostic coronary  arteriography via the right radial approach to define his anatomy and rule out an ischemic etiology      IMPRESSION:Mr. Wenig has noncritical CAD with severe LV dysfunction. I think that his inferior perfusion abnormality was artifactual. I do not think his moderate proximal LAD lesion is hemostatically significant. 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 The patient received 5000 units of heparin intravenously. He received radial cocktail. The SideArm sheath. A total of 55 mL of contrast was used for the case. The sheath was removed and a TR band was placed on the right wrist to  achieve patent hemostasis. The patient left the lab in stable condition. He'll be discharged home in 2 hours and will follow-up with me in the office in several weeks.  Lauro Portal. MD, [facility] 03/17/2015 12:46 PM  Findings Coronary Findings Diagnostic  Dominance: Right  Left Anterior Descending The lesion is type non-C, tubular .  The lesion was not previously treated.  Pressure wire/FFR was not performed on the lesionIVUS was not performed on the lesion.  First Diagonal Branch  Right Coronary Artery  Right Posterior Descending Artery  Intervention  No interventions have been documented.   STRESS TESTS  MYOCARDIAL PERFUSION IMAGING 02/02/2020  Narrative  Nuclear stress EF: 31%.  There was no ST segment deviation noted during stress.  Findings consistent with prior myocardial infarction.  This is a high risk study.  The left ventricular ejection fraction is moderately decreased (30-44%).  Low exercise tolerance (3 METs), unable to reach target heart rate, so study converted to pharmacologic. Baseline blood pressure very elevated at baseline (198/96). There are diffuse perfusion defects at rest and with stress, sparing only the mid-lateral wall. There is no significant reversibility on this study. Based on distribution, would be concerned that this is artifact. However, with low EF, suggests diffuse areas of poor perfusion without significant reversibility.   ECHOCARDIOGRAM  ECHOCARDIOGRAM COMPLETE 12/15/2023  Narrative ECHOCARDIOGRAM REPORT    Patient Name:   Lucas Dudley Date of Exam: 12/15/2023 Medical Rec #:  [id number]     Height:       70.0 in Accession #:    [id number]    Weight:       251.1 lb Date of Birth:  05-May-1941      BSA:          2.299 m Patient Age:    82 years      BP:           155/75 mmHg Patient Gender: M             HR:           64 bpm. Exam Location:  [facility]  Procedure: 2D Echo, Cardiac Doppler, Color Doppler and  Intracardiac Opacification Agent (Both Spectral and Color Flow Doppler were utilized during procedure).  Indications:    CHF  History:        Patient has prior history of Echocardiogram examinations, most recent 06/06/2022. Cardiomyopathy, CAD; Risk Factors:Sleep Apnea, Former Smoker, Hypertension and Diabetes.  Sonographer:    Adelia Homestead RVT RCS Referring Phys: [id number] EJIROGHENE E EMOKPAE  IMPRESSIONS   1. Left ventricular ejection fraction, by estimation, is 55 to 60%. Left ventricular ejection fraction by PLAX is 59 %. The left ventricle has normal function. The left ventricle has no regional wall motion abnormalities. There is moderate concentric left ventricular hypertrophy. Left ventricular diastolic parameters are indeterminate. 2. Right ventricular systolic function is  mildly reduced. The right ventricular size is normal. There is severely elevated pulmonary artery systolic pressure. The estimated right ventricular systolic pressure is 62.6 mmHg. 3. The mitral valve is grossly normal. No evidence of mitral valve regurgitation. 4. The aortic valve is tricuspid. Aortic valve regurgitation is not visualized. Aortic valve sclerosis/calcification is present, without any evidence of aortic stenosis. 5. The inferior vena cava is dilated in size with <50% respiratory variability, suggesting right atrial pressure of 15 mmHg.  Comparison(s): Changes from prior study are noted. 06/06/2022: LVEF 60-65%, mild LVH, low normal RV function.  FINDINGS Left Ventricle: Left ventricular ejection fraction, by estimation, is 55 to 60%. Left ventricular ejection fraction by PLAX is 59 %. The left ventricle has normal function. The left ventricle has no regional wall motion abnormalities. Definity contrast agent was given IV to delineate the left ventricular endocardial borders. Strain imaging was not performed. The left ventricular internal cavity size was normal in size. There is moderate concentric left  ventricular hypertrophy. Left ventricular diastolic parameters are indeterminate.  Right Ventricle: The right ventricular size is normal. No increase in right ventricular wall thickness. Right ventricular systolic function is mildly reduced. There is severely elevated pulmonary artery systolic pressure. The tricuspid regurgitant velocity is 3.45 m/s, and with an assumed right atrial pressure of 15 mmHg, the estimated right ventricular systolic pressure is 62.6 mmHg.  Left Atrium: Left atrial size was normal in size.  Right Atrium: Right atrial size was normal in size.  Pericardium: Trivial pericardial effusion is present. The pericardial effusion is posterior to the left ventricle.  Mitral Valve: The mitral valve is grossly normal. No evidence of mitral valve regurgitation.  Tricuspid Valve: The tricuspid valve is not well visualized. Tricuspid valve regurgitation is not demonstrated.  Aortic Valve: The aortic valve is tricuspid. Aortic valve regurgitation is not visualized. Aortic valve sclerosis/calcification is present, without any evidence of aortic stenosis. Aortic valve mean gradient measures 4.0 mmHg. Aortic valve peak gradient measures 6.8 mmHg. Aortic valve area, by VTI measures 1.59 cm.  Pulmonic Valve: The pulmonic valve was not well visualized. Pulmonic valve regurgitation is not visualized.  Aorta: The aortic root and ascending aorta are structurally normal, with no evidence of dilitation.  Venous: The inferior vena cava is dilated in size with less than 50% respiratory variability, suggesting right atrial pressure of 15 mmHg.  IAS/Shunts: No atrial level shunt detected by color flow Doppler.  Additional Comments: 3D imaging was not performed.   LEFT VENTRICLE PLAX 2D LV EF:         Left            Diastology ventricular     LV e' medial:    5.81 cm/s ejection        LV E/e' medial:  13.9 fraction by     LV e' lateral:   8.81 cm/s PLAX is 59      LV E/e' lateral:  9.1 %. LVIDd:         5.55 cm LVIDs:         3.80 cm LV PW:         1.65 cm LV IVS:        1.75 cm LVOT diam:     2.20 cm LV SV:         46 LV SV Index:   20 LVOT Area:     3.80 cm   RIGHT VENTRICLE            IVC RV  Basal diam:  4.40 cm    IVC diam: 2.40 cm RV S prime:     8.93 cm/s TAPSE (M-mode): 1.8 cm  LEFT ATRIUM             Index        RIGHT ATRIUM           Index LA diam:        4.30 cm 1.87 cm/m   RA Area:     20.00 cm LA Vol (A2C):   73.8 ml 32.10 ml/m  RA Volume:   58.90 ml  25.62 ml/m LA Vol (A4C):   54.1 ml 23.53 ml/m LA Biplane Vol: 64.3 ml 27.97 ml/m AORTIC VALVE                    PULMONIC VALVE AV Area (Vmax):    1.83 cm     PV Vmax:       0.51 m/s AV Area (Vmean):   1.71 cm     PV Peak grad:  1.1 mmHg AV Area (VTI):     1.59 cm AV Vmax:           130.00 cm/s AV Vmean:          86.900 cm/s AV VTI:            0.289 m AV Peak Grad:      6.8 mmHg AV Mean Grad:      4.0 mmHg LVOT Vmax:         62.60 cm/s LVOT Vmean:        39.000 cm/s LVOT VTI:          0.121 m LVOT/AV VTI ratio: 0.42  AORTA Ao Root diam: 3.00 cm Ao Asc diam:  3.60 cm  MITRAL VALVE               TRICUSPID VALVE MV Area (PHT): 4.41 cm    TR Peak grad:   47.6 mmHg MV Decel Time: 172 msec    TR Vmax:        345.00 cm/s MV E velocity: 80.60 cm/s MV A velocity: 16.30 cm/s  SHUNTS MV E/A ratio:  4.94        Systemic VTI:  0.12 m Systemic Diam: 2.20 cm  Dinah Franco MD Electronically signed by Dinah Franco MD Signature Date/Time: 12/15/2023/11:41:54 AM    Final   TEE  ECHO TEE 06/28/2022  Narrative TRANSESOPHOGEAL ECHO REPORT    Patient Name:   Lucas Dudley Date of Exam: 06/28/2022 Medical Rec #:  [id number]     Height:       70.0 in Accession #:    [id number]    Weight:       239.0 lb Date of Birth:  1941-05-04      BSA:          2.251 m Patient Age:    81 years      BP:           182/82 mmHg Patient Gender: M             HR:           65 bpm. Exam Location:   Inpatient  Procedure: Transesophageal Echo, 3D Echo, Color Doppler and Cardiac Doppler  Indications:     I48.91* Unspecified atrial fibrillation  History:         Patient has prior history of Echocardiogram examinations, most recent 06/06/2022. Arrythmias:Atrial Fibrillation; Risk Factors:Hypertension, Diabetes, Dyslipidemia and Sleep  Apnea.  Sonographer:     Irving Burton Senior RDCS Referring Phys:  [id number] MICHAEL COOPER Diagnosing Phys: Thurmon Fair MD   Sonographer Comments: Watchman Procedure   PROCEDURE: After discussion of the risks and benefits of a TEE, an informed consent was obtained from the patient. The transesophogeal probe was passed without difficulty through the esophogus of the patient. Sedation performed by different physician. The patient was monitored while under deep sedation. The patient developed no complications during the procedure.  TEE (including live 3D imaging and 3D postprocessing) was used to guide transseptal puncture, direct device deployment and evaluate the final result and screen for complications.  IMPRESSIONS   1. Left ventricular ejection fraction, by estimation, is 55 to 60%. The left ventricle has normal function. The left ventricle has no regional wall motion abnormalities. 2. Right ventricular systolic function is normal. The right ventricular size is normal. Tricuspid regurgitation signal is inadequate for assessing PA pressure. 3. After the procedure there is a well-seated 31 mm Watchman FLX appendage occlusion device, with adequate compression and no evidence of peridevice leak. After the procedure there is a tiny atrial septal defect in the fossa ovalis, with exclusively left-to-right shunt. Left atrial size was mild to moderately dilated. No left atrial/left atrial appendage thrombus was detected. 4. Right atrial size was mildly dilated. 5. The mitral valve is normal in structure. Mild mitral valve regurgitation. 6. The aortic valve is  tricuspid. Aortic valve regurgitation is not visualized. No aortic stenosis is present. 7. There is mild (Grade II) layered plaque.  FINDINGS Left Ventricle: Left ventricular ejection fraction, by estimation, is 55 to 60%. The left ventricle has normal function. The left ventricle has no regional wall motion abnormalities. The left ventricular internal cavity size was normal in size. There is no left ventricular hypertrophy.  Right Ventricle: The right ventricular size is normal. No increase in right ventricular wall thickness. Right ventricular systolic function is normal. Tricuspid regurgitation signal is inadequate for assessing PA pressure.  Left Atrium: After the procedure there is a well-seated 31 mm Watchman FLX appendage occlusion device, with adequate compression and no evidence of peridevice leak. After the procedure there is a tiny atrial septal defect in the fossa ovalis, with exclusively left-to-right shunt. Left atrial size was mild to moderately dilated. No left atrial/left atrial appendage thrombus was detected.  Right Atrium: Right atrial size was mildly dilated.  Pericardium: There is no evidence of pericardial effusion.  Mitral Valve: The mitral valve is normal in structure. Mild mitral valve regurgitation, with centrally-directed jet.  Tricuspid Valve: The tricuspid valve is normal in structure. Tricuspid valve regurgitation is trivial.  Aortic Valve: The aortic valve is tricuspid. Aortic valve regurgitation is not visualized. No aortic stenosis is present.  Pulmonic Valve: The pulmonic valve was normal in structure. Pulmonic valve regurgitation is mild.  Aorta: The aortic root, ascending aorta, aortic arch and descending aorta are all structurally normal, with no evidence of dilitation or obstruction. There is mild (Grade II) layered plaque.  IAS/Shunts: The interatrial septum appears to be lipomatous.  Thurmon Fair MD Electronically signed by Thurmon Fair  MD Signature Date/Time: 06/28/2022/1:25:16 PM    Final  MONITORS  CARDIAC EVENT MONITOR 02/16/2015  Narrative  Paroxysmal atrial fib  The patient was in atrial fib for a significant amount of the monitored time. Many days he had atrial fib for almost the entire day. He had several pauses of > 2 seconds. 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  = - Consented for [facility] 01/24/24 (See Dr. Jesus Morones Note) NPO at midnight; on Dr. 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 Scheduled for left and right heart catheterization to assess coronary status; asymptomatic  Atrial fibrillation - Persistent atrial fibrillation status post Watchman implant. Currently managed with amiodarone 200 mg. Recent EKG shows sinus rhythm.  SR on telemetry - Continue amiodarone 200 mg for atrial fibrillation management.  Chronic kidney disease IIIb - Stable o improved - Consider finerinone as an outpatient if appropriate. - continue SGLT2i - pending RHC  Type 2 diabetes mellitus - managed by TRH  Iron deficiency anemia - Hgb fluctuating around 8 - Continue aspirin 81 mg.  Obstructive sleep apnea - CPAP is recommended  If no evidence of RV dysfunction and euvolemic, nearing hospital discharge (normal cath could leave today from a cardiac  standpoint)  For questions or updates, please contact [facility] Please consult [email or web address] for contact info under Cardiology/STEMI.      Gloriann Larger, MD FASE [facility] Cardiologist [facility]  [address] [place], [address] [place], [place] [address] [phone]  9:00 AM   Returned to evaluate patient after cath  Key event: - discussed with Dr. Alease Amend. Non-obstructive CAD. He felt that the patient had PVR of 2 but with normal output but nothing requiring IV medications.  Would need PFTs and therapy for COPD.   - Patient notes no symptoms . No chest pain or pressure .  No SOB/DOE. In the room he has a sat of 84% with poor wave form.  Now on 1 L O2. - cath side with no hematoma.  Good distal flow. I will start spironolactone 25 mg PO daily. - He is on 1 L O2 at this time. He has never been told about COPD despite his 54 pack year hx. I encouarged him to f/u with Dr. Lydia Sams.  - reviewed care with patient, his friend, and his daughter Shelvy Dickens.  Gloriann Larger, MD FASE [facility] Cardiologist [facility]  [address] [place], [address] [place], [place] [address] [phone]  2:02 PM

## 2024-01-28 ENCOUNTER — Encounter: Payer: Self-pay | Admitting: Physician Assistant

## 2024-01-28 ENCOUNTER — Ambulatory Visit: Payer: Medicare Other | Admitting: Nurse Practitioner

## 2024-01-28 ENCOUNTER — Other Ambulatory Visit (HOSPITAL_COMMUNITY): Payer: Self-pay

## 2024-01-28 ENCOUNTER — Encounter: Payer: Self-pay | Admitting: Gastroenterology

## 2024-01-28 DIAGNOSIS — I5030 Unspecified diastolic (congestive) heart failure: Secondary | ICD-10-CM | POA: Diagnosis not present

## 2024-01-28 DIAGNOSIS — J9601 Acute respiratory failure with hypoxia: Secondary | ICD-10-CM | POA: Diagnosis not present

## 2024-01-28 DIAGNOSIS — I2510 Atherosclerotic heart disease of native coronary artery without angina pectoris: Secondary | ICD-10-CM | POA: Diagnosis not present

## 2024-01-28 LAB — RENAL FUNCTION PANEL
Albumin: 3 g/dL — ABNORMAL LOW (ref 3.5–5.0)
Anion gap: 10 (ref 5–15)
BUN: 32 mg/dL — ABNORMAL HIGH (ref 8–23)
CO2: 34 mmol/L — ABNORMAL HIGH (ref 22–32)
Calcium: 9 mg/dL (ref 8.9–10.3)
Chloride: 92 mmol/L — ABNORMAL LOW (ref 98–111)
Creatinine, Ser: 1.73 mg/dL — ABNORMAL HIGH (ref 0.61–1.24)
GFR, Estimated: 39 mL/min — ABNORMAL LOW (ref >60)
Glucose, Bld: 75 mg/dL (ref 70–99)
Phosphorus: 3.1 mg/dL (ref 2.5–4.6)
Potassium: 3.9 mmol/L (ref 3.5–5.1)
Sodium: 136 mmol/L (ref 135–145)

## 2024-01-28 LAB — CBC
HCT: 28.8 % — ABNORMAL LOW (ref 39.0–52.0)
Hemoglobin: 9.1 g/dL — ABNORMAL LOW (ref 13.0–17.0)
MCH: 28.6 pg (ref 26.0–34.0)
MCHC: 31.6 g/dL (ref 30.0–36.0)
MCV: 90.6 fL (ref 80.0–100.0)
Platelets: 638 10*3/uL — ABNORMAL HIGH (ref 150–400)
RBC: 3.18 MIL/uL — ABNORMAL LOW (ref 4.22–5.81)
RDW: 18.1 % — ABNORMAL HIGH (ref 11.5–15.5)
WBC: 9 10*3/uL (ref 4.0–10.5)
nRBC: 0 % (ref 0.0–0.2)

## 2024-01-28 LAB — GLUCOSE, CAPILLARY
Glucose-Capillary: 64 mg/dL — ABNORMAL LOW (ref 70–99)
Glucose-Capillary: 85 mg/dL (ref 70–99)
Glucose-Capillary: 97 mg/dL (ref 70–99)

## 2024-01-28 MED ORDER — SPIRONOLACTONE 25 MG PO TABS
25.0000 mg | ORAL_TABLET | Freq: Every day | ORAL | 0 refills | Status: DC
Start: 2024-01-29 — End: 2024-02-05
  Filled 2024-01-28: qty 30, 30d supply, fill #0

## 2024-01-28 MED ORDER — GLIMEPIRIDE 2 MG PO TABS
2.0000 mg | ORAL_TABLET | Freq: Every day | ORAL | 0 refills | Status: DC
  Filled 2024-01-28: qty 30, 30d supply, fill #0

## 2024-01-28 MED ORDER — DAPAGLIFLOZIN PROPANEDIOL 10 MG PO TABS
10.0000 mg | ORAL_TABLET | Freq: Every day | ORAL | 0 refills | Status: DC
  Filled 2024-01-28: qty 30, 30d supply, fill #0

## 2024-01-28 NOTE — Progress Notes (Signed)
 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 Patient was diuresed during admission and home lisinopril was held due to renal dysfunction. Discharge weight was 244.2 lbs. Patient reported to cardiology clinic on 01/02/2024 with complaints of dyspnea on exertion and fatigue.  Patient was recently admitted to [facility] 01[facility]/2024 for planned small bowel endoscopy 2/2 iron deficiency anemia work up - 2 non-bleeding lesions found in duodenum.   Presented to the ED on 3/31 with worsening SOB, hypoxia, and orthopnea. Patient reports chronic but worsening leg swelling  In the ED, patient was hypoxic. JVD and BL leg swelling present on exam. Very mild increased work of breathing, crackles bibasilarly more prominent in the left lung base. CXR 3/31 showed small left pleural effusion, stable cardiac silhouette, left lung base atelectasis or infiltrate. Repeat CXR on 4/7 showed improved aeration to the left lung base. Most recent ECHO 12/15/2023 showed LVEF 55-60% (changed from 60-65% 05/2022) with normal LV function. Moderate concentric LVH. Mildly reduced RV systolic function. Normal MV. Aortic valve regurgitation not visualized. Sclerosis/calcification is present without any evidence of aortic stenosis. Cardiology consulted. Attempted d/ch on 4/6 with torsemide, but deferred due to hypoxia, SOB. IV lasix restarted s/p Metolazone 2.5mg  x3. Patient underwent R/LHC on 4/14 which showed RA 14, PA mean 32, PCW 16, Fick CO/CI 5.78/2.59. Left main disease, lesion to left circumflex, stenosis to RCA and RPDA. Moderate, nonobstructive  coronary artery disease with mild progression from 2016.   Patient reports that SOB is present when bending over but has overall improved since procedure. Trace edema on exam. Reports that $30 copay is affordable and questions about purpose of farxiga. Patient informed about long term HF and kidney benefits with farxiga.     Current HF Medications: Diuretic: s/p IV lasix 60mg  x1 (last dose given on 4/13) MRA: Spironolactone 25mg  daily SGLT2i: farxiga 10mg  daily  Prior to admission HF Medications: Diuretic: Torsemide 20mg  AM and 40mg  PM  Other: Hydralazine 10mg  TID, Kcl daily   Pertinent Lab Values: Serum creatinine admit 1.73>1.89>1.69>1.74 (BL 1.1-1.3) , BUN 34>32, Potassium 3.1>3.9, Sodium 136, BNP 327, Magnesium 1.9 (4/11), A1c 8.3,   Vital Signs: Weight: 232 lbs (admission weight: 246 lbs) Blood pressure: 100-130s/50-80s  Heart rate: 50-60s I/O: net +21.25mL (NPO for procedure) yesterday; net -17.2L since admission RA  Medication Assistance / Insurance Benefits Check: Does the patient have prescription insurance?  Yes Type of insurance plan: Medicare   Outpatient Pharmacy:  Prior to admission outpatient pharmacy: [facility] [place] Is the patient willing to use [facility] TOC pharmacy at discharge? Yes Is the patient willing to transition their outpatient pharmacy to utilize a [facility] outpatient pharmacy?   No    Assessment: 1. Acute on chronic diastolic CHF (LVEF 55-60%), due to nonobstructive CAD. NYHA class 1 symptoms. Strict I/Os and daily weights. Keep K>4 and Mg>2. Trace LE edema. Carvedilol recently stopped in 10/2023 due to bradycardia. Lisinopril held upon discharge in 12/2023 due to renal dysfunction. Cards planning on resuming PTA torsemide upon discharge.  - Continue farxiga 10mg  daily - Continue Spironolactone 25mg  daily  - Consider restarting IV/PO lasix if evidence of  pitting edema on exam   Plan: 1) Medication changes recommended at this time: - Monitor renal  function  - Order Mg   2) Patient assistance: - Patient is interested in obtaining discharge medications from [facility] pharmacy.  Lucas Dudley copay: $30 Lucas Dudley copay: $30 - Reports that $30 copay is affordable  3)  Education  - Patient has been educated on current HF medications and potential additions to HF medication regimen - Patient verbalizes understanding that over the next few months, these medication doses may change and more medications may be added to optimize HF regimen - Patient has been educated on basic disease state pathophysiology and goals of therapy    Lucas Dudley, PharmD PGY1 Pharmacy Resident

## 2024-01-28 NOTE — Care Management Important Message (Signed)
 Important Message  Patient Details  Name: Lucas Dudley MRN: [id number] Date of Birth: 1941/06/29   Important Message Given:  Yes - Medicare IM     Janith Melnick 01/28/2024, 9:51 AM

## 2024-01-28 NOTE — Discharge Summary (Signed)
 Lucas Dudley ZOX:[id number] DOB: [birth year] DOA: 01/13/2024  PCP: Meldon Sport, MD  Admit date: 01/13/2024  Discharge date: 01/28/2024  Admitted From: Home   disposition: Home  Recommendations for Outpatient Follow-up:   Follow up with PCP in 3-5 days for follow-up of blood sugar, blood pressure, potassium and creatinine.   Home Health: N/A Equipment/Devices: N/A Consultations: Cardiology Discharge Condition: Improved CODE STATUS: Full Diet Recommendation: Heart Healthy   Diet Order             Diet - low sodium heart healthy           Diet regular Room service appropriate? 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 Patient was diuresed during admission and home lisinopril was held due to renal dysfunction. Discharge weight was 244.2 lbs. Patient reported to cardiology clinic on 01/02/2024 with complaints of dyspnea on exertion and fatigue.   Patient was recently admitted to [facility] 01/07/2024 for planned small bowel endoscopy 2/2 iron deficiency anemia work up - 2 non-bleeding lesions found in duodenum.    Presented to the ED on 3/31 with worsening SOB, hypoxia, and orthopnea. Patient reports chronic but worsening leg swelling   In  the ED, patient was hypoxic. JVD and BL leg swelling present on exam. Very mild increased work of breathing, crackles bibasilarly more prominent in the left lung base. CXR 3/31 showed small left pleural effusion, stable cardiac silhouette, left lung base atelectasis or infiltrate. Repeat CXR on 4/7 showed improved aeration to the left lung base. Most recent ECHO 12/15/2023 showed LVEF 55-60% (changed from 60-65% 05/2022) with normal LV function. Moderate concentric LVH. Mildly reduced RV systolic function. Normal MV. Aortic valve regurgitation not visualized. Sclerosis/calcification is present without any evidence of aortic stenosis. Cardiology consulted. Attempted d/ch on 4/6 with torsemide, but deferred due to hypoxia, SOB. IV lasix restarted s/p Metolazone 2.5mg  x3.   Acute hypoxic respiratory failure Decompensated HFpEF and HFrEF Pulmonary hypertension Likely undiagnosed COPD/OSA tribute into pulmonary hypertension Echo 3/25 noted EF 55-60%, indeterminate  diastolic function, mildly reduced RV, severe pulmonary hypertension, PASP 63  Has responded well to diuresis is down 20 pounds Jardiance and spironolactone were initiated  DM2 Given initiation of Jardiance, patient's Amaryl decreased by half Patient is to check his blood sugars and follow-up with his PCP in 3 to 5 days  CAD RHC/LHC 4/14 showed nonobstructive CAD Continue aspirin  Atrial fibrillation Watchman device placed September 2023 Off of anticoagulation Continue amiodarone   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 dry weight BNP of 111   Essential hypertension   Coronary artery disease involving native coronary artery of native heart with angina  pectoris (HCC)   Paroxysmal atrial fibrillation (HCC)   Type 2 diabetes mellitus with other specified complication (HCC)   Persistent atrial fibrillation (HCC)   Acute on chronic heart failure with preserved ejection fraction (HCC)   Pulmonary hypertension, unspecified (HCC)   Right ventricular dysfunction, NYHA class 3   AKI (acute kidney injury) (HCC)   Type 2 diabetes mellitus with hyperglycemia, with long-term current use of insulin (HCC)   Iron deficiency anemia   Atherosclerosis of native coronary artery of native heart without angina pectoris   Acute heart failure with preserved ejection fraction (HFpEF) (HCC)    Discharge instructions    Discharge Instructions     Diet - low sodium heart healthy   Complete by: As directed    Discharge instructions   Complete by: As directed    1.  Make sure you understand what medication changes have been made.   2. The heart doctors have put you on a medicine called Farxiga which is also a diabetes medicine as well as a heart medicine.  Because of this I have cut your Amaryl down from 1 tablet twice a day to 1 tablet once a day.  Please check your blood sugars at least twice a day until you see your PCP. 3.  You also have a new medicine called spironolactone, also for your heart.  You will continue to take your torsemide and your potassium.  Stop taking your hydralazine.   4.  Make an appointment with your PCP in 3 to 5 days to check your blood pressure, your blood sugar and a blood test to check your potassium and kidney function.   Discharge wound care:   Complete by: As directed    Transparent icing as warranted   Increase activity slowly   Complete by: As directed        Discharge Medications   Allergies as of 01/28/2024       Reactions   Azithromycin Nausea Only   Tape Rash, Other (See Comments)   Causes skin redness, Use paper tape only.        Medication List     STOP taking these medications    hydrALAZINE 10 MG  tablet Commonly known as: APRESOLINE   metFORMIN 500 MG 24 hr tablet Commonly known as: GLUCOPHAGE-XR       TAKE these medications    acetaminophen 650 MG CR tablet Commonly known as: TYLENOL Take 650 mg by mouth at bedtime.   albuterol 108 (90 Base) MCG/ACT inhaler Commonly known as: VENTOLIN HFA Inhale 2 puffs into  the lungs every 6 (six) hours as needed for wheezing or shortness of breath.   amiodarone 200 MG tablet Commonly known as: PACERONE TAKE 1 TABLET BY MOUTH EVERY DAY   ammonium lactate 12 % lotion Commonly known as: Amlactin Daily Apply 1 Application topically as needed.   Anti-Itch lotion Generic drug: camphor-menthol Apply 1 Application topically daily as needed for itching.   aspirin 81 MG chewable tablet Chew 81 mg by mouth daily.   Basaglar KwikPen 100 UNIT/ML Inject 45 Units into the skin at bedtime. What changed: how much to take   Combigan 0.2-0.5 % ophthalmic solution Generic drug: brimonidine-timolol Place 1 drop into both eyes 2 (two) times daily.   dapagliflozin propanediol 10 MG Tabs tablet Commonly known as: FARXIGA Take 1 tablet (10 mg total) by mouth daily. Start taking on: January 29, 2024   diphenhydramine-acetaminophen 25-500 MG Tabs tablet Commonly known as: TYLENOL PM Take 1 tablet by mouth at bedtime.   ELDERBERRY PO Take 2 tablets by mouth daily. Chewable   ferrous sulfate 325 (65 FE) MG tablet Take 1 tablet (325 mg total) by mouth daily with breakfast.   fluticasone 50 MCG/ACT nasal spray Commonly known as: FLONASE SPRAY 2 SPRAYS INTO EACH NOSTRIL EVERY DAY What changed: See the new instructions.   gemfibrozil 600 MG tablet Commonly known as: LOPID Take 600 mg by mouth at bedtime.   glimepiride 2 MG tablet Commonly known as: AMARYL Take 1 tablet (2 mg total) by mouth daily with breakfast. What changed: when to take this   magnesium gluconate 500 MG tablet Commonly known as: MAGONATE Take 500 mg by mouth  daily.   mineral oil liquid Take 15 mLs by mouth daily as needed for moderate constipation.   OVER THE COUNTER MEDICATION Take 1 tablet by mouth daily. Beets Chew   pantoprazole 40 MG tablet Commonly known as: PROTONIX TAKE 1 TABLET BY MOUTH EVERY DAY   potassium chloride 10 MEQ tablet Commonly known as: KLOR-CON Take 1 tablet (10 mEq total) by mouth daily.   spironolactone 25 MG tablet Commonly known as: ALDACTONE Take 1 tablet (25 mg total) by mouth daily. Start taking on: January 29, 2024   torsemide 20 MG tablet Commonly known as: DEMADEX 40 Mg in the morning and 20 Mg in the evening What changed:  how much to take how to take this when to take this additional instructions   zinc gluconate 50 MG tablet Take 50 mg by mouth daily.               Discharge Care Instructions  (From admission, onward)           Start     Ordered   01/28/24 0000  Discharge wound care:       Comments: Transparent icing as warranted   01/28/24 1313             Follow-up Information     [facility]. Go in 6 day(s).   Specialty: Cardiology Why: Hospital follow up 02/04/2024 @ 2:45 pm PLEASE bring a current medication list to appointment FREE valet parking, Entrance C, off [employer] information: [address] [place]  [address] [phone]        Lasalle Pointer, NP Follow up.   Specialty: Cardiology Why: Follow-up with General Cardiology scheduled for 02/17/2024 at 4pm. Please arrive 15 minute early for check-in. If this date/ time does not work for you, please call our office  to reschedule. Contact information: [address] [place] [address] [phone]                 Major procedures and Radiology Reports - PLEASE review detailed and final reports thoroughly  -       CARDIAC CATHETERIZATION Result Date: 01/27/2024   Prox RCA to Mid RCA lesion is 40%  stenosed.   RPDA lesion is 60% stenosed. HEMODYNAMICS: RA:       12 mmHg (mean) RV:       61/8, 12 mmHg PA:       63/17 mmHg (32 mean) PCWP: 16 mmHg (mean) with v waves to 26    Estimated Fick CO/CI   5.78L/min, 2.59L/min/m2    TPG  16  mmHg     PVR  2.7 Wood Units PAPi  3.83  IMPRESSION: Moderate, nonobstructive coronary artery disease with mild progression from 2016. 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 Well compensated RV function. Normal cardiac output/index RECOMMENDATIONS: Results conveyed to ordering cardiologist. Continued diuresis and evaluated for group III PH given extensive smoking history and OSA   DG Chest 2 View Result Date: 01/20/2024 CLINICAL DATA:  Shortness of breath EXAM: CHEST - 2 VIEW COMPARISON:  Chest x-ray performed January 13, 2024 FINDINGS: There is improved aeration in the left lung base. Heart and mediastinum are not significantly changed. No pneumothorax or pleural effusion. IMPRESSION: 1.  Improved aeration the left lung base. Electronically Signed   By: Lowell Guitar M.D.   On: 01/20/2024 13:30   DG Chest 2 View Result Date: 01/15/2024 CLINICAL DATA:  Dyspnea. EXAM: CHEST - 2 VIEW COMPARISON:  12/14/2023 chest radiograph and chest CT. FINDINGS: Cardiac silhouette is enlarged, as it was on the prior exams. However, based on the prior CT, this apparent cardiac enlargement is due to significant epicardial fat with the overall heart being normal in size on the prior CT. No mediastinal or hilar masses.  No evidence of adenopathy. Lungs are clear.  No pleural effusion or pneumothorax. Skeletal structures are intact. Stable changes from prior right shoulder surgery. IMPRESSION: No acute cardiopulmonary disease. Electronically Signed   By: Amie Portland M.D.   On: 01/15/2024 14:37   DG Chest Port 1 View Result Date:  01/13/2024 CLINICAL DATA:  Shortness of breath. EXAM: PORTABLE CHEST 1 VIEW COMPARISON:  Chest radiograph dated 01/02/2024. FINDINGS: Shallow inspiration. Left lung base density, progressed since the prior radiograph and may represent atelectasis or infiltrate. A small left pleural effusion is not excluded no pneumothorax. Stable cardiac silhouette. No acute osseous pathology. IMPRESSION: Left lung base atelectasis or infiltrate. Electronically Signed   By: Elgie Collard M.D.   On: 01/13/2024 16:33   Intravitreal Injection, Pharmacologic Agent - OD - Right Eye Result Date: 01/03/2024 Time Out 01/03/2024. 2:45 PM. Confirmed correct patient, procedure, site, and patient consented. Anesthesia Topical anesthesia was used. Anesthetic medications included Lidocaine 2%, Proparacaine 0.5%. Procedure Preparation included 5% betadine to ocular surface, eyelid speculum. A supplied (32g) needle was used. Injection: 6 mg faricimab-svoa 6 MG/0.05ML (Patient supplied)   Route: Intravitreal, Site: Right Eye   NDC: 26948-546-27, Lot: [id number], Expiration date: 06/13/2025, Waste: 0 mL Post-op Post injection exam found visual acuity of at least counting fingers. The patient tolerated the procedure well. There were no complications. The patient received written and verbal post procedure care education. Notes **  SAMPLE MEDICATION ADMINISTERED**   Intravitreal Injection, Pharmacologic Agent - OS - Left Eye Result Date: 01/03/2024 Time Out 01/03/2024. 2:45 PM. Confirmed correct patient, procedure, site, and patient consented. Anesthesia Topical anesthesia was used. Anesthetic medications included Lidocaine 2%, Proparacaine 0.5%. Procedure Preparation included 5% betadine to ocular surface, eyelid speculum. A (32g) needle was used. Injection: 6 mg faricimab-svoa 6 MG/0.05ML (Patient supplied)   Route: Intravitreal, Site: Left Eye   NDC: 16109-604-54, Lot: [id number], Expiration date: 06/13/2025, Waste: 0 mL Post-op Post injection exam  found visual acuity of at least counting fingers. The patient tolerated the procedure well. There were no complications. The patient received written and verbal post procedure care education. Post injection medications were not given. Notes **SAMPLE MEDICATION ADMINISTERED**  OCT, Retina - OU - Both Eyes Result Date: 01/03/2024 Right Eye Quality was good. Central Foveal Thickness: 290. Progression has improved. Findings include no SRF, abnormal foveal contour, intraretinal fluid, vitreomacular adhesion (persistent IRF/IRHM -- slightly improved, partial PVD). Left Eye Quality was good. Central Foveal Thickness: 288. Progression has improved. Findings include normal foveal contour, no IRF, no SRF, intraretinal hyper-reflective material (Trace persistent cystic changes and punctate IRHM inferior fovea-- slightly improved; partial PVD). Notes *Images captured and stored on drive Diagnosis / Impression: +DME OU OD: persistent IRF/IRHM -- slightly improved, partial PVD OS: Trace persistent cystic changes and punctate IRHM inferior fovea--slightly improved; partial PVD Clinical management: See below Abbreviations: NFP - Normal foveal profile. CME - cystoid macular edema. PED - pigment epithelial detachment. IRF - intraretinal fluid. SRF - subretinal fluid. EZ - ellipsoid zone. ERM - epiretinal membrane. ORA - outer retinal atrophy. ORT - outer retinal tubulation. SRHM - subretinal hyper-reflective material. IRHM - intraretinal hyper-reflective material   Micro Results    No results found for this or any previous visit (from the past 240 hours).  Today   Subjective    Lucas Dudley feels much improved since admission.  Feels ready to go home.  Denies chest pain, shortness of breath or abdominal pain.  Feels they can take care of themselves with the resources they have at home.  Objective   Blood pressure (!) 132/53, pulse 95, temperature 97.7 F (36.5 C), temperature source Oral, resp. rate 18, height 5'  10" (1.778 m), weight 105.4 kg, SpO2 92%.   Intake/Output Summary (Last 24 hours) at 01/28/2024 1315 Last data filed at 01/28/2024 0855 Gross per 24 hour  Intake 836.46 ml  Output 725 ml  Net 111.46 ml    Exam General: Patient appears well and in good spirits sitting up in bed in no acute distress.  Eyes: sclera anicteric, conjuctiva mild injection bilaterally CVS: S1-S2, regular  Respiratory:  decreased air entry bilaterally secondary to decreased inspiratory effort, rales at bases  GI: NABS, soft, NT  LE: No edema.  Neuro: A/O x 3, Moving all extremities equally with normal strength, CN 3-12 intact, grossly nonfocal.  Psych: patient is logical and coherent, judgement and insight appear normal, mood and affect appropriate to situation.    Data Review   CBC w Diff:  Lab Results  Component Value Date   WBC 9.0 01/28/2024   HGB 9.1 (L) 01/28/2024   HGB 11.7 (L) 12/30/2023   HCT 28.8 (L) 01/28/2024   HCT 37.4 (L) 12/30/2023   PLT 638 (H) 01/28/2024   PLT 465 (H) 12/30/2023   LYMPHOPCT 9 01/13/2024   MONOPCT 8 01/13/2024   EOSPCT 0 01/13/2024   BASOPCT 0 01/13/2024    CMP:  Lab Results  Component Value Date   NA 136 01/28/2024   NA 140 12/30/2023   K 3.9 01/28/2024   CL 92 (L) 01/28/2024   CO2 34 (H) 01/28/2024   BUN 32 (H) 01/28/2024   BUN 24 12/30/2023   CREATININE 1.73 (H) 01/28/2024   CREATININE 1.27 (H) 04/14/2021   PROT 7.5 01/13/2024   PROT 7.3 12/30/2023   ALBUMIN 3.0 (L) 01/28/2024   ALBUMIN 4.2 12/30/2023   BILITOT 0.7 01/13/2024   BILITOT 0.3 12/30/2023   ALKPHOS 52 01/13/2024   AST 28 01/13/2024   ALT 20 01/13/2024  .   Total Time in preparing paper work, data evaluation and todays exam - 35 minutes  Eila Runyan Tublu Patriece Archbold M.D on 01/28/2024 at 1:15 PM  [facility]

## 2024-01-28 NOTE — Plan of Care (Signed)
  Problem: Education: Goal: Knowledge of General Education information will improve Description: Including pain rating scale, medication(s)/side effects and non-pharmacologic comfort measures Outcome: Progressing   Problem: Health Behavior/Discharge Planning: Goal: Ability to manage health-related needs will improve Outcome: Progressing   Problem: Clinical Measurements: Goal: Ability to maintain clinical measurements within normal limits will improve Outcome: Progressing Goal: Will remain free from infection Outcome: Progressing Goal: Diagnostic test results will improve Outcome: Progressing Goal: Respiratory complications will improve Outcome: Progressing Goal: Cardiovascular complication will be avoided Outcome: Progressing   Problem: Activity: Goal: Risk for activity intolerance will decrease Outcome: Progressing   Problem: Nutrition: Goal: Adequate nutrition will be maintained Outcome: Progressing   Problem: Coping: Goal: Level of anxiety will decrease Outcome: Progressing   Problem: Elimination: Goal: Will not experience complications related to bowel motility Outcome: Progressing Goal: Will not experience complications related to urinary retention Outcome: Progressing   Problem: Pain Managment: Goal: General experience of comfort will improve and/or be controlled Outcome: Progressing   Problem: Safety: Goal: Ability to remain free from injury will improve Outcome: Progressing   Problem: Skin Integrity: Goal: Risk for impaired skin integrity will decrease Outcome: Progressing   Problem: Education: Goal: Ability to describe self-care measures that may prevent or decrease complications (Diabetes Survival Skills Education) will improve Outcome: Progressing Goal: Individualized Educational Video(s) Outcome: Progressing   Problem: Coping: Goal: Ability to adjust to condition or change in health will improve Outcome: Progressing   Problem: Fluid  Volume: Goal: Ability to maintain a balanced intake and output will improve Outcome: Progressing   Problem: Health Behavior/Discharge Planning: Goal: Ability to identify and utilize available resources and services will improve Outcome: Progressing Goal: Ability to manage health-related needs will improve Outcome: Progressing   Problem: Metabolic: Goal: Ability to maintain appropriate glucose levels will improve Outcome: Progressing   Problem: Nutritional: Goal: Maintenance of adequate nutrition will improve Outcome: Progressing Goal: Progress toward achieving an optimal weight will improve Outcome: Progressing   Problem: Skin Integrity: Goal: Risk for impaired skin integrity will decrease Outcome: Progressing   Problem: Tissue Perfusion: Goal: Adequacy of tissue perfusion will improve Outcome: Progressing   Problem: Education: Goal: Understanding of CV disease, CV risk reduction, and recovery process will improve Outcome: Progressing Goal: Individualized Educational Video(s) Outcome: Progressing   Problem: Activity: Goal: Ability to return to baseline activity level will improve Outcome: Progressing   Problem: Cardiovascular: Goal: Ability to achieve and maintain adequate cardiovascular perfusion will improve Outcome: Progressing Goal: Vascular access site(s) Level 0-1 will be maintained Outcome: Progressing   Problem: Health Behavior/Discharge Planning: Goal: Ability to safely manage health-related needs after discharge will improve Outcome: Progressing   Problem: Education: Goal: Ability to demonstrate management of disease process will improve Outcome: Progressing Goal: Ability to verbalize understanding of medication therapies will improve Outcome: Progressing Goal: Individualized Educational Video(s) Outcome: Progressing   Problem: Activity: Goal: Capacity to carry out activities will improve Outcome: Progressing   Problem: Cardiac: Goal: Ability to  achieve and maintain adequate cardiopulmonary perfusion will improve Outcome: Progressing

## 2024-01-28 NOTE — Progress Notes (Signed)
 Progress Note  Patient Name: Lucas Dudley Date of Encounter: 01/28/2024 Primary Cardiologist: Nona Dell, MD   Subjective   Patient feels great. No O2 needed.  Walking the halls with no issues.  Vital Signs    Vitals:   01/28/24 0157 01/28/24 0454 01/28/24 0504 01/28/24 0808  BP: (!) 106/48 (!) 109/50 128/74 (!) 132/53  Pulse: 60 60 98 95  Resp: 20 16 20 18   Temp: (!) 97.4 F (36.3 C) 98.2 F (36.8 C) 98.2 F (36.8 C) 97.7 F (36.5 C)  TempSrc: Axillary Axillary Oral Oral  SpO2: 94% 95% 91% 92%  Weight:  105.4 kg    Height:        Intake/Output Summary (Last 24 hours) at 01/28/2024 0944 Last data filed at 01/28/2024 0855 Gross per 24 hour  Intake 836.46 ml  Output 725 ml  Net 111.46 ml   Filed Weights   01/26/24 0520 01/27/24 0438 01/28/24 0454  Weight: 105.8 kg 105.7 kg 105.4 kg    Physical Exam   GEN: No acute distress.  Obese male Cardiac: RRR, distant heart sounds  Respiratory: CTAB on no O2 GI: Soft, nontender, distended  MS: Skin is warm  Labs   Telemetry: SR   Chemistry Recent Labs  Lab 01/26/24 0257 01/27/24 0335 01/27/24 1019 01/27/24 1025 01/28/24 0314  NA 134* 134* 134*  133* 134* 136  K 3.5 3.1* 3.9  3.8 3.8 3.9  CL 89* 90*  --   --  92*  CO2 34* 32  --   --  34*  GLUCOSE 120* 73  --   --  75  BUN 36* 34*  --   --  32*  CREATININE 1.89* 1.69*  --   --  1.73*  CALCIUM 8.9 8.6*  --   --  9.0  ALBUMIN  --   --   --   --  3.0*  GFRNONAA 35* 40*  --   --  39*  ANIONGAP 11 12  --   --  10     Hematology Recent Labs  Lab 01/26/24 0257 01/27/24 0335 01/27/24 1019 01/27/24 1025 01/28/24 0314  WBC 10.4 7.3  --   --  9.0  RBC 3.00* 2.88*  --   --  3.18*  HGB 8.5* 8.2* 9.9*  9.9* 9.2* 9.1*  HCT 27.3* 26.1* 29.0*  29.0* 27.0* 28.8*  MCV 91.0 90.6  --   --  90.6  MCH 28.3 28.5  --   --  28.6  MCHC 31.1 31.4  --   --  31.6  RDW 18.3* 17.8*  --   --  18.1*  PLT 533* 534*  --   --  638*    Cardiac Studies   Cardiac  Studies & Procedures   ______________________________________________________________________________________________ CARDIAC CATHETERIZATION  CARDIAC CATHETERIZATION 01/27/2024  Conclusion   Prox RCA to Mid RCA lesion is 40% stenosed.   RPDA lesion is 60% stenosed.  HEMODYNAMICS: RA:       12 mmHg (mean) RV:       61/8, 12 mmHg PA:       63/17 mmHg (32 mean) PCWP: 16 mmHg (mean) with v waves to 26  Estimated Fick CO/CI   5.78L/min, 2.59L/min/m2  TPG  16  mmHg PVR  2.7 Wood Units PAPi  3.83   IMPRESSION: Moderate, nonobstructive coronary artery disease with mild progression from 2016. 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 Well compensated RV function. Normal cardiac output/index  RECOMMENDATIONS: Results conveyed to ordering cardiologist. Continued diuresis and evaluated for group III PH given extensive smoking history and OSA  Findings Coronary Findings Diagnostic  Dominance: Right  Left Main 30% distal left main disease.  Left Anterior Descending Supplies a small branching D1 and a large branching D2. Mild diffuse disease.  Left Circumflex Supplies small branching OM and a PL branch. There is a 70% lesion at the ostium of the small Pl branch. Otherwise mild diffuse disease.  Right Coronary Artery Extremely large, dominant vessel. Supplies the PDA and multiple PL branches Prox RCA to Mid RCA lesion is 40% stenosed.  Right Posterior Descending Artery RPDA lesion is 60% stenosed.  Intervention  No interventions have been documented.   CARDIAC CATHETERIZATION  CARDIAC CATHETERIZATION 03/17/2015  Conclusion Images from the original result were not included.  Ost 1st Diag lesion, 100% stenosed.  Prox LAD to Mid LAD lesion, 50% stenosed.  RPDA lesion, 40% stenosed.  Lucas Dudley is a 83 y.o.  male   [id number] LOCATION:  FACILITY: [facility] PHYSICIAN: Nanetta Batty, M.D. 14-May-1941   DATE OF PROCEDURE:  03/17/2015  DATE OF DISCHARGE:     CARDIAC CATHETERIZATION    History obtained from chart review.The patient is a 83 year old, mild to mildly overweight, married Caucasian male, father of 1, grandfather to 2 grandchildren who I last saw in the office /5/16. He is retired from [employer]. His risk factors include hypertension, hyperlipidemia, as well as diabetes and remote tobacco abuse having quit 25 years ago. His mother did die of an MI at age 67. He has never had a heart attack or stroke. His last stress test was performed approximately 3 years ago was nonischemic. Since I saw him 6 months ago he developed dyspnea on exertion and some orthopnea several weeks ago which has ultimately improved with the addition of an oral diuretic. He does get occasional chest tightness. Recent Myoview stress test performed 02/09/15 showed inferior scar with moderate peri-infarct ischemia and a 2-D echo showed an EF of 40% which represents a decline from 50-55% by echo back in 2010. An event Monitor showed sick sinus syndrome/PAF with pauses up to 2.5 seconds. He presents today for outpatient diagnostic coronary arteriography via the right radial approach to define his anatomy and rule out an ischemic etiology      IMPRESSION:Lucas Dudley has noncritical CAD with severe LV dysfunction. I think that his inferior perfusion abnormality was artifactual. I do not think his moderate proximal LAD lesion is hemostatically significant. 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 The patient received 5000 units of heparin intravenously. He received radial cocktail. The SideArm sheath. A total of 55 mL of contrast was used for the case. The sheath was removed and a TR band was placed on the right wrist to achieve patent hemostasis. The patient left the lab in stable condition.  He'll be discharged home in 2 hours and will follow-up with me in the office in several weeks.  Nanetta Batty. MD, [facility] 03/17/2015 12:46 PM  Findings Coronary Findings Diagnostic  Dominance: Right  Left Anterior Descending The lesion is type non-C, tubular .  The lesion was not previously treated.  Pressure wire/FFR was not performed on the lesionIVUS was not performed on the lesion.  First Diagonal Branch  Right Coronary Artery  Right Posterior Descending Artery  Intervention  No interventions have  been documented.   STRESS TESTS  MYOCARDIAL PERFUSION IMAGING 02/02/2020  Narrative  Nuclear stress EF: 31%.  There was no ST segment deviation noted during stress.  Findings consistent with prior myocardial infarction.  This is a high risk study.  The left ventricular ejection fraction is moderately decreased (30-44%).  Low exercise tolerance (3 METs), unable to reach target heart rate, so study converted to pharmacologic. Baseline blood pressure very elevated at baseline (198/96). There are diffuse perfusion defects at rest and with stress, sparing only the mid-lateral wall. There is no significant reversibility on this study. Based on distribution, would be concerned that this is artifact. However, with low EF, suggests diffuse areas of poor perfusion without significant reversibility.   ECHOCARDIOGRAM  ECHOCARDIOGRAM COMPLETE 12/15/2023  Narrative ECHOCARDIOGRAM REPORT    Patient Name:   MARL SEAGO Date of Exam: 12/15/2023 Medical Rec #:  [id number]     Height:       70.0 in Accession #:    [id number]    Weight:       251.1 lb Date of Birth:  1940/12/20      BSA:          2.299 m Patient Age:    82 years      BP:           155/75 mmHg Patient Gender: M             HR:           64 bpm. Exam Location:  [facility]  Procedure: 2D Echo, Cardiac Doppler, Color Doppler and Intracardiac Opacification Agent (Both Spectral and Color Flow Doppler were utilized during  procedure).  Indications:    CHF  History:        Patient has prior history of Echocardiogram examinations, most recent 06/06/2022. Cardiomyopathy, CAD; Risk Factors:Sleep Apnea, Former Smoker, Hypertension and Diabetes.  Sonographer:    Adelia Homestead RVT RCS Referring Phys: [id number] EJIROGHENE E EMOKPAE  IMPRESSIONS   1. Left ventricular ejection fraction, by estimation, is 55 to 60%. Left ventricular ejection fraction by PLAX is 59 %. The left ventricle has normal function. The left ventricle has no regional wall motion abnormalities. There is moderate concentric left ventricular hypertrophy. Left ventricular diastolic parameters are indeterminate. 2. Right ventricular systolic function is mildly reduced. The right ventricular size is normal. There is severely elevated pulmonary artery systolic pressure. The estimated right ventricular systolic pressure is 62.6 mmHg. 3. The mitral valve is grossly normal. No evidence of mitral valve regurgitation. 4. The aortic valve is tricuspid. Aortic valve regurgitation is not visualized. Aortic valve sclerosis/calcification is present, without any evidence of aortic stenosis. 5. The inferior vena cava is dilated in size with <50% respiratory variability, suggesting right atrial pressure of 15 mmHg.  Comparison(s): Changes from prior study are noted. 06/06/2022: LVEF 60-65%, mild LVH, low normal RV function.  FINDINGS Left Ventricle: Left ventricular ejection fraction, by estimation, is 55 to 60%. Left ventricular ejection fraction by PLAX is 59 %. The left ventricle has normal function. The left ventricle has no regional wall motion abnormalities. Definity contrast agent was given IV to delineate the left ventricular endocardial borders. Strain imaging was not performed. The left ventricular internal cavity size was normal in size. There is moderate concentric left ventricular hypertrophy. Left ventricular diastolic parameters are indeterminate.  Right  Ventricle: The right ventricular size is normal. No increase in right ventricular wall thickness. Right ventricular systolic function is mildly reduced. There is severely elevated  pulmonary artery systolic pressure. The tricuspid regurgitant velocity is 3.45 m/s, and with an assumed right atrial pressure of 15 mmHg, the estimated right ventricular systolic pressure is 62.6 mmHg.  Left Atrium: Left atrial size was normal in size.  Right Atrium: Right atrial size was normal in size.  Pericardium: Trivial pericardial effusion is present. The pericardial effusion is posterior to the left ventricle.  Mitral Valve: The mitral valve is grossly normal. No evidence of mitral valve regurgitation.  Tricuspid Valve: The tricuspid valve is not well visualized. Tricuspid valve regurgitation is not demonstrated.  Aortic Valve: The aortic valve is tricuspid. Aortic valve regurgitation is not visualized. Aortic valve sclerosis/calcification is present, without any evidence of aortic stenosis. Aortic valve mean gradient measures 4.0 mmHg. Aortic valve peak gradient measures 6.8 mmHg. Aortic valve area, by VTI measures 1.59 cm.  Pulmonic Valve: The pulmonic valve was not well visualized. Pulmonic valve regurgitation is not visualized.  Aorta: The aortic root and ascending aorta are structurally normal, with no evidence of dilitation.  Venous: The inferior vena cava is dilated in size with less than 50% respiratory variability, suggesting right atrial pressure of 15 mmHg.  IAS/Shunts: No atrial level shunt detected by color flow Doppler.  Additional Comments: 3D imaging was not performed.   LEFT VENTRICLE PLAX 2D LV EF:         Left            Diastology ventricular     LV e' medial:    5.81 cm/s ejection        LV E/e' medial:  13.9 fraction by     LV e' lateral:   8.81 cm/s PLAX is 59      LV E/e' lateral: 9.1 %. LVIDd:         5.55 cm LVIDs:         3.80 cm LV PW:         1.65 cm LV IVS:         1.75 cm LVOT diam:     2.20 cm LV SV:         46 LV SV Index:   20 LVOT Area:     3.80 cm   RIGHT VENTRICLE            IVC RV Basal diam:  4.40 cm    IVC diam: 2.40 cm RV S prime:     8.93 cm/s TAPSE (M-mode): 1.8 cm  LEFT ATRIUM             Index        RIGHT ATRIUM           Index LA diam:        4.30 cm 1.87 cm/m   RA Area:     20.00 cm LA Vol (A2C):   73.8 ml 32.10 ml/m  RA Volume:   58.90 ml  25.62 ml/m LA Vol (A4C):   54.1 ml 23.53 ml/m LA Biplane Vol: 64.3 ml 27.97 ml/m AORTIC VALVE                    PULMONIC VALVE AV Area (Vmax):    1.83 cm     PV Vmax:       0.51 m/s AV Area (Vmean):   1.71 cm     PV Peak grad:  1.1 mmHg AV Area (VTI):     1.59 cm AV Vmax:           130.00 cm/s AV  Vmean:          86.900 cm/s AV VTI:            0.289 m AV Peak Grad:      6.8 mmHg AV Mean Grad:      4.0 mmHg LVOT Vmax:         62.60 cm/s LVOT Vmean:        39.000 cm/s LVOT VTI:          0.121 m LVOT/AV VTI ratio: 0.42  AORTA Ao Root diam: 3.00 cm Ao Asc diam:  3.60 cm  MITRAL VALVE               TRICUSPID VALVE MV Area (PHT): 4.41 cm    TR Peak grad:   47.6 mmHg MV Decel Time: 172 msec    TR Vmax:        345.00 cm/s MV E velocity: 80.60 cm/s MV A velocity: 16.30 cm/s  SHUNTS MV E/A ratio:  4.94        Systemic VTI:  0.12 m Systemic Diam: 2.20 cm  Zoila Shutter MD Electronically signed by Zoila Shutter MD Signature Date/Time: 12/15/2023/11:41:54 AM    Final   TEE  ECHO TEE 06/28/2022  Narrative TRANSESOPHOGEAL ECHO REPORT    Patient Name:   Lauralyn Primes Date of Exam: 06/28/2022 Medical Rec #:  [id number]     Height:       70.0 in Accession #:    [id number]    Weight:       239.0 lb Date of Birth:  1941/08/24      BSA:          2.251 m Patient Age:    81 years      BP:           182/82 mmHg Patient Gender: M             HR:           65 bpm. Exam Location:  Inpatient  Procedure: Transesophageal Echo, 3D Echo, Color Doppler and Cardiac  Doppler  Indications:     I48.91* Unspecified atrial fibrillation  History:         Patient has prior history of Echocardiogram examinations, most recent 06/06/2022. Arrythmias:Atrial Fibrillation; Risk Factors:Hypertension, Diabetes, Dyslipidemia and Sleep Apnea.  Sonographer:     Irving Burton Senior RDCS Referring Phys:  [phone] MICHAEL COOPER Diagnosing Phys: Thurmon Fair MD   Sonographer Comments: Watchman Procedure   PROCEDURE: After discussion of the risks and benefits of a TEE, an informed consent was obtained from the patient. The transesophogeal probe was passed without difficulty through the esophogus of the patient. Sedation performed by different physician. The patient was monitored while under deep sedation. The patient developed no complications during the procedure.  TEE (including live 3D imaging and 3D postprocessing) was used to guide transseptal puncture, direct device deployment and evaluate the final result and screen for complications.  IMPRESSIONS   1. Left ventricular ejection fraction, by estimation, is 55 to 60%. The left ventricle has normal function. The left ventricle has no regional wall motion abnormalities. 2. Right ventricular systolic function is normal. The right ventricular size is normal. Tricuspid regurgitation signal is inadequate for assessing PA pressure. 3. After the procedure there is a well-seated 31 mm Watchman FLX appendage occlusion device, with adequate compression and no evidence of peridevice leak. After the procedure there is a tiny atrial septal defect in the fossa ovalis, with exclusively left-to-right shunt. Left atrial  size was mild to moderately dilated. No left atrial/left atrial appendage thrombus was detected. 4. Right atrial size was mildly dilated. 5. The mitral valve is normal in structure. Mild mitral valve regurgitation. 6. The aortic valve is tricuspid. Aortic valve regurgitation is not visualized. No aortic stenosis is  present. 7. There is mild (Grade II) layered plaque.  FINDINGS Left Ventricle: Left ventricular ejection fraction, by estimation, is 55 to 60%. The left ventricle has normal function. The left ventricle has no regional wall motion abnormalities. The left ventricular internal cavity size was normal in size. There is no left ventricular hypertrophy.  Right Ventricle: The right ventricular size is normal. No increase in right ventricular wall thickness. Right ventricular systolic function is normal. Tricuspid regurgitation signal is inadequate for assessing PA pressure.  Left Atrium: After the procedure there is a well-seated 31 mm Watchman FLX appendage occlusion device, with adequate compression and no evidence of peridevice leak. After the procedure there is a tiny atrial septal defect in the fossa ovalis, with exclusively left-to-right shunt. Left atrial size was mild to moderately dilated. No left atrial/left atrial appendage thrombus was detected.  Right Atrium: Right atrial size was mildly dilated.  Pericardium: There is no evidence of pericardial effusion.  Mitral Valve: The mitral valve is normal in structure. Mild mitral valve regurgitation, with centrally-directed jet.  Tricuspid Valve: The tricuspid valve is normal in structure. Tricuspid valve regurgitation is trivial.  Aortic Valve: The aortic valve is tricuspid. Aortic valve regurgitation is not visualized. No aortic stenosis is present.  Pulmonic Valve: The pulmonic valve was normal in structure. Pulmonic valve regurgitation is mild.  Aorta: The aortic root, ascending aorta, aortic arch and descending aorta are all structurally normal, with no evidence of dilitation or obstruction. There is mild (Grade II) layered plaque.  IAS/Shunts: The interatrial septum appears to be lipomatous.  Thurmon Fair MD Electronically signed by Thurmon Fair MD Signature Date/Time: 06/28/2022/1:25:16 PM    Final  MONITORS  CARDIAC  EVENT MONITOR 02/16/2015  Narrative  Paroxysmal atrial fib  The patient was in atrial fib for a significant amount of the monitored time. Many days he had atrial fib for almost the entire day. He had several pauses of > 2 seconds. 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  Obstructive sleep apnea - CPAP is recommended  Will sign off: At discharge resume home torsemide dose DC home hydralazine On low dose K   For questions or updates, please contact [facility] Please consult [email or web address] for contact info under Cardiology/STEMI.      Riley Lam, MD FASE [facility] Cardiologist [facility]  [address] [place], [address] [place], [place] [address] [phone]  9:44 AM

## 2024-01-28 NOTE — Inpatient Diabetes Management (Signed)
 Inpatient Diabetes Program Recommendations  AACE/ADA: New Consensus Statement on Inpatient Glycemic Control (2015)  Target Ranges:  Prepandial:   less than 140 mg/dL      Peak postprandial:   less than 180 mg/dL (1-2 hours)      Critically ill patients:  140 - 180 mg/dL   Lab Results  Component Value Date   GLUCAP 97 01/28/2024   HGBA1C 8.3 (H) 11/11/2023    Review of Glycemic Control  Latest Reference Range & Units 01/27/24 16:09 01/27/24 21:16 01/28/24 04:53 01/28/24 05:25 01/28/24 12:00  Glucose-Capillary 70 - 99 mg/dL 045 (H) 409 (H) 64 (L) 85 97  (H): Data is abnormally high (L): Data is abnormally low Diabetes history: Type 2 DM Outpatient Diabetes medications: Amaryl 2 mg BID, Basaglar 35 units at bedtime, Metformin 500 mg BID Current orders for Inpatient glycemic control: Novolog 0-15 units TID & HS, Farxiga 10 mg every day, Semglee 30 units QHS  Inpatient Diabetes Program Recommendations:    If patient to remain inpatient, consider reducing Semglee to 26 units at bedtime.   Thanks, Marjo Sievert, MSN, RNC-OB Diabetes Coordinator [phone] (8a-5p)

## 2024-01-28 NOTE — Progress Notes (Signed)
 Mobility Specialist Progress Note:   01/28/24 0940  Mobility  Activity Ambulated with assistance in hallway  Level of Assistance Standby assist, set-up cues, supervision of patient - no hands on  Assistive Device None  Distance Ambulated (ft) 150 ft  Activity Response Tolerated well  Mobility Referral Yes  Mobility visit 1 Mobility  Mobility Specialist Start Time (ACUTE ONLY) 0940  Mobility Specialist Stop Time (ACUTE ONLY) 0956  Mobility Specialist Time Calculation (min) (ACUTE ONLY) 16 min   Pt agreeable to mobility session. Required no physical assistance throughout ambulation with slow, steady gait. No overt SOB noted, however unable to get reliable SpO2 pleth until EOS, 90% on RA. Pt left sitting in chair with all needs met.   Oneda Big Mobility Specialist Please contact via SecureChat or  Rehab office at [phone]

## 2024-01-28 NOTE — TOC Transition Note (Signed)
 Transition of Care [facility]) - Discharge Note   Patient Details  Name: Lucas Dudley MRN: [id number] Date of Birth: [birth year]  Transition of Care [facility]) CM/SW Contact:  Tom-Johnson, Sherica Paternostro Daphne, RN Phone Number: 01/28/2024, 1:29 PM   Clinical Narrative:     Patient is scheduled for discharge today.  Readmission Risk Assessment done. Outpatient f/u, hospital f/u and discharge instructions on AVS. Prescriptions sent to [facility] pharmacy and patient will receive meds prior discharge. Family to transport at discharge.  No further TOC needs noted.      Final next level of care: Home/Self Care Barriers to Discharge: Barriers Resolved   Patient Goals and CMS Choice Patient states their goals for this hospitalization and ongoing recovery are:: To return home CMS Medicare.gov Compare Post Acute Care list provided to:: Patient Choice offered to / list presented to : Patient      Discharge Placement                Patient to be transferred to facility by: Family      Discharge Plan and Services Additional resources added to the After Visit Summary for   In-house Referral: Clinical Social Work Discharge Planning Services: CM Consult                                 Social Drivers of Health (SDOH) Interventions SDOH Screenings   Food Insecurity: Patient Declined (01/14/2024)  Housing: Low Risk  (01/27/2024)  Transportation Needs: No Transportation Needs (01/27/2024)  Utilities: Patient Declined (01/14/2024)  Alcohol Screen: Low Risk  (01/27/2024)  Depression (PHQ2-9): Low Risk  (12/31/2023)  Financial Resource Strain: Low Risk  (01/27/2024)  Physical Activity: Inactive (11/20/2023)  Social Connections: Patient Declined (01/14/2024)  Recent Concern: Social Connections - Moderately Isolated (11/20/2023)  Stress: No Stress Concern Present (11/20/2023)  Tobacco Use: Medium Risk (01/13/2024)  Health Literacy: Adequate Health Literacy (11/20/2023)     Readmission Risk  Interventions    01/28/2024    1:28 PM 01/14/2024   10:05 AM 12/17/2023   10:19 AM  Readmission Risk Prevention Plan  Transportation Screening Complete Complete Complete  Home Care Screening   Complete  Medication Review (RN CM)   Complete  HRI or Home Care Consult  Complete   Social Work Consult for Recovery Care Planning/Counseling  Complete   Palliative Care Screening  Not Applicable   Medication Review Oceanographer) Referral to Pharmacy Complete   PCP or Specialist appointment within 3-5 days of discharge Complete    HRI or Home Care Consult Complete    SW Recovery Care/Counseling Consult Complete    Palliative Care Screening Not Applicable    Skilled Nursing Facility Not Applicable

## 2024-01-29 ENCOUNTER — Telehealth: Payer: Self-pay

## 2024-01-29 NOTE — Transitions of Care (Post Inpatient/ED Visit) (Signed)
 01/29/2024  Name: Lucas Dudley MRN: [id number] DOB: 1941/09/20  Today's TOC FU Call Status: Today's TOC FU Call Status:: Successful TOC FU Call Completed TOC FU Call Complete Date: 01/29/24 Patient's Name and Date of Birth confirmed.  Transition Care Management Follow-up Telephone Call How have you been since you were released from the hospital?: Better (Reports no pain, reports breathing is better. Reports that he got a good night sleep) Any questions or concerns?: No  Items Reviewed: Did you receive and understand the discharge instructions provided?: Yes Medications obtained,verified, and reconciled?: Yes (Medications Reviewed) Any new allergies since your discharge?: No Dietary orders reviewed?: Yes Type of Diet Ordered:: low salt heart healthy Do you have support at home?: Yes People in Home [RPT]: friend(s), child(ren), adult Name of Support/Comfort Primary Source: neighbor and daughter  Medications Reviewed Today: Medications Reviewed Today     Reviewed by Earlie Server, RN (Registered Nurse) on 01/29/24 at 1532  Med List Status: <None>   Medication Order Taking? Sig Documenting Provider Last Dose Status Informant  acetaminophen (TYLENOL) 650 MG CR tablet [id number] Yes Take 650 mg by mouth every 8 (eight) hours as needed for pain. During the day time and Tylenol PM at night [provider] Taking Active Self  albuterol (VENTOLIN HFA) 108 (90 Base) MCG/ACT inhaler [id number] Yes Inhale 2 puffs into the lungs every 6 (six) hours as needed for wheezing or shortness of breath. [provider] Taking Active Self  amiodarone (PACERONE) 200 MG tablet [id number] Yes TAKE 1 TABLET BY MOUTH EVERY DAY Jonelle Sidle, MD Taking Active Self  ammonium lactate (AMLACTIN DAILY) 12 % lotion [id number] Yes Apply 1 Application topically as needed. Candelaria Stagers, DPM Taking Active Self  aspirin 81 MG chewable tablet [id number] Yes Chew 81 mg by mouth daily. [provider] Taking Active Self  camphor-menthol (ANTI-ITCH) lotion [id number] Yes Apply 1 Application topically daily as needed for itching. [provider] Taking Active Self  COMBIGAN 0.2-0.5 % ophthalmic solution [id number] Yes Place 1 drop into both eyes 2 (two) times daily. [provider] Taking Active Self  dapagliflozin propanediol (FARXIGA) 10 MG TABS tablet [id number] Yes Take 1 tablet (10 mg total) by mouth daily. Pieter Partridge, MD Taking Active   diphenhydramine-acetaminophen (TYLENOL PM) 25-500 MG TABS tablet [id number] Yes Take 1 tablet by mouth at bedtime. [provider] Taking Active Self  ELDERBERRY PO [id number] Yes Take 2 tablets by mouth daily. Chewable [provider] Taking Active Self  ferrous sulfate 325 (65 FE) MG tablet [id number] Yes Take 1 tablet (325 mg total) by mouth daily with breakfast. Buford Dresser, Rebekah M, PA-C Taking Active Self  fluticasone (FLONASE) 50 MCG/ACT nasal spray [id number] Yes SPRAY 2 SPRAYS INTO EACH NOSTRIL EVERY DAY  Patient taking differently: Place 2 sprays into both nostrils daily.   Anabel Halon, MD Taking Active Self  gemfibrozil (LOPID) 600 MG tablet [id number] Yes Take 600 mg by mouth at bedtime.  [provider] Taking Active Self  glimepiride (AMARYL) 2 MG tablet [id number] Yes Take 1 tablet (2 mg total) by mouth daily with breakfast. Pieter Partridge, MD Taking Active   Insulin Glargine [facility] KWIKPEN) 100 UNIT/ML [id number] Yes Inject 45 Units into the skin at bedtime.  Patient taking differently: Inject 38 Units into the skin at bedtime.   Anabel Halon, MD Taking Active Self           Med Note Hart Rochester, Earline [place]otte Jan 02, 2024  1:30 PM)    magnesium gluconate (MAGONATE) 500 MG tablet [id number] Yes Take 500 mg by mouth daily. [provider] Taking Active Self  mineral oil liquid [id number] Yes Take 15 mLs by mouth daily as needed for moderate constipation.  [provider] Taking Active Self  Omega-3 Fatty Acids (FISH OIL EXTRA STRENGTH) 1200 MG CAPS [id number] Yes Take 1 capsule by mouth daily. [provider] Taking Active   OVER THE COUNTER MEDICATION [id number] Yes Take 1 tablet by mouth daily. Beets Chew [provider] Taking Active Self  pantoprazole (PROTONIX) 40 MG tablet [id number] Yes TAKE 1 TABLET BY MOUTH EVERY DAY Carlan, Chelsea L, NP Taking Active Self  potassium chloride (KLOR-CON) 10 MEQ tablet [id number] Yes Take 1 tablet (10 mEq total) by mouth daily. Jonelle Sidle, MD Taking Active Self  spironolactone (ALDACTONE) 25 MG tablet [id number] Yes Take 1 tablet (25 mg total) by mouth daily. Pieter Partridge, MD Taking Active   torsemide [facility]) 20 MG tablet [id number] Yes 40 Mg in the morning and 20 Mg in the evening  Patient taking differently: Take 20-40 mg by mouth 2 (two) times daily. 20 Mg in the morning and 40 Mg in the evening   Carollee Herter, DO Taking Active Self  vitamin E 45 MG (100 UNITS) capsule [id number] Yes Take 100 Units by mouth daily. [provider] Taking Active   zinc gluconate 50 MG tablet [id number] Yes Take 50 mg by mouth daily. [provider] Taking Active Self            Home Care and Equipment/Supplies: Were Home Health Services Ordered?: No Any new equipment or medical supplies ordered?: No  Functional Questionnaire: Do you need assistance with bathing/showering or dressing?: No Do you need assistance with meal preparation?: No Do you need assistance with eating?: No Do you have difficulty maintaining continence: No Do you need assistance with getting out of bed/getting out of a chair/moving?: No Do you have difficulty managing or taking your medications?: No  Follow up appointments reviewed: PCP Follow-up appointment confirmed?: Yes Date of PCP follow-up appointment?: 02/10/24 Follow-up Provider: Dr. Allena Katz Specialist [facility] Follow-up  appointment confirmed?: Yes Date of Specialist follow-up appointment?: 02/04/24 Follow-Up Specialty Provider:: [facility] Do you need transportation to your follow-up appointment?: No Do you understand care options if your condition(s) worsen?: Yes-patient verbalized understanding  SDOH Interventions Today    Flowsheet Row Most Recent Value  SDOH Interventions   Food Insecurity Interventions Intervention Not Indicated  Housing Interventions Intervention Not Indicated  Transportation Interventions Intervention Not Indicated  Utilities Interventions Intervention Not Indicated      Patient reports that he is doing well. Reports that he got a good night sleep last night. Reports taking all medications as prescribed.  Reports weight today of 236 pounds.  States fasting cbg of 160 this am.   During call patient request to be done with call. States that he has company.  I offered to finish call at a later time and patient declined. Provided my contact information for patient to call me back if needed. He agreed. Reviewed importance of low salt diet, daily weights and when to call MD.    Lonia Chimera, RN, BSN, CEN Population Health- Transition of Care Team.  [facility] [phone]

## 2024-01-29 NOTE — Transitions of Care (Post Inpatient/ED Visit) (Signed)
   01/29/2024  Name: HIROKI WINT MRN: [id number] DOB: 12-12-1940  Today's TOC FU Call Status: Today's TOC FU Call Status:: Unsuccessful Call (1st Attempt) Unsuccessful Call (1st Attempt) Date: 01/29/24  Attempted to reach the patient regarding the most recent Inpatient/ED visit. Patient was called in an Outreach attempt to offer [facility]  30-day TOC program. Pt is eligible for program due to potential risk for readmission and/or high utilization. Unfortunately, I was not able to speak with the patient in regards to recent hospital discharge   Left a HIPAA compliant phone message for patient including [facility]BC[facility] CM contact information with request for a call back in regard to recent hospital discharge    Follow Up Plan: Additional outreach attempts will be made to reach the patient to complete the Transitions of Care (Post Inpatient/ED visit) call.   James Mcardle , BSN, RN [facility]   VBCI-Population Health RN Care Manager Direct Dial [phone]  Fax: [phone] Website: [email or web address]

## 2024-01-30 ENCOUNTER — Other Ambulatory Visit

## 2024-01-30 ENCOUNTER — Inpatient Hospital Stay: Attending: Hematology

## 2024-01-30 VITALS — BP 150/73 | HR 55 | Temp 98.8°F | Resp 16

## 2024-01-30 DIAGNOSIS — D509 Iron deficiency anemia, unspecified: Secondary | ICD-10-CM | POA: Insufficient documentation

## 2024-01-30 DIAGNOSIS — D500 Iron deficiency anemia secondary to blood loss (chronic): Secondary | ICD-10-CM

## 2024-01-30 DIAGNOSIS — Z79899 Other long term (current) drug therapy: Secondary | ICD-10-CM | POA: Diagnosis not present

## 2024-01-30 MED ORDER — SODIUM CHLORIDE 0.9 % IV SOLN: INTRAVENOUS | Status: DC

## 2024-01-30 MED ORDER — SODIUM CHLORIDE 0.9 % IV SOLN
510.0000 mg | Freq: Once | INTRAVENOUS | Status: AC
  Administered 2024-01-30: 510 mg via INTRAVENOUS
  Filled 2024-01-30: qty 510

## 2024-01-30 MED ORDER — ACETAMINOPHEN 325 MG PO TABS
650.0000 mg | ORAL_TABLET | Freq: Once | ORAL | Status: AC
  Administered 2024-01-30: 650 mg via ORAL
  Filled 2024-01-30: qty 2

## 2024-01-30 MED ORDER — CETIRIZINE HCL 10 MG/ML IV SOLN
5.0000 mg | Freq: Once | INTRAVENOUS | Status: AC
  Administered 2024-01-30: 5 mg via INTRAVENOUS
  Filled 2024-01-30: qty 1

## 2024-01-30 NOTE — Progress Notes (Signed)
 Patient presents today for Feraheme infusion per providers order.  Vital signs WNL.  Patient has no new complaints at this time.  Peripheral IV started and blood return noted pre and post infusion.  Stable during infusion without adverse affects.  Vital signs stable.  No complaints at this time.  Discharge from clinic ambulatory in stable condition.  Alert and oriented X 3.  Follow up with [facility] as scheduled.

## 2024-01-30 NOTE — Progress Notes (Signed)
 Patient tolerated iron infusion well with no complaints voiced.  Patient left via wheelchair in stable condition.  Vital signs stable at discharge.  Follow up as scheduled.

## 2024-01-30 NOTE — Patient Instructions (Signed)
 [facility] [place] - A DEPT OF [facility]  Discharge Instructions: Thank you for choosing [facility] to provide your oncology and hematology care.  If you have a lab appointment with the Cancer Center - please note that after April 8th, 2024, all labs will be drawn in the cancer center.  You do not have to check in or register with the main entrance as you have in the past but will complete your check-in in the cancer center.  Wear comfortable clothing and clothing appropriate for easy access to any Portacath or PICC line.   We strive to give you quality time with your provider. You may need to reschedule your appointment if you arrive late (15 or more minutes).  Arriving late affects you and other patients whose appointments are after yours.  Also, if you miss three or more appointments without notifying the office, you may be dismissed from the clinic at the provider's discretion.      For prescription refill requests, have your pharmacy contact our office and allow 72 hours for refills to be completed.    Today you received the following:  Feraheme.  Ferumoxytol Injection What is this medication? FERUMOXYTOL (FER ue MOX i tol) treats low levels of iron in your body (iron deficiency anemia). Iron is a mineral that plays an important role in making red blood cells, which carry oxygen from your lungs to the rest of your body. This medicine may be used for other purposes; ask your health care provider or pharmacist if you have questions. COMMON BRAND NAME(S): Feraheme What should I tell my care team before I take this medication? They need to know if you have any of these conditions: Anemia not caused by low iron levels High levels of iron in the blood Magnetic resonance imaging (MRI) test scheduled An unusual or allergic reaction to iron, other medications, foods, dyes, or preservatives Pregnant or trying to get pregnant Breastfeeding How should I  use this medication? This medication is injected into a vein. It is given by your care team in a hospital or clinic setting. Talk to your care team the use of this medication in children. Special care may be needed. Overdosage: If you think you have taken too much of this medicine contact a poison control center or emergency room at once. NOTE: This medicine is only for you. Do not share this medicine with others. What if I miss a dose? It is important not to miss your dose. Call your care team if you are unable to keep an appointment. What may interact with this medication? Other iron products This list may not describe all possible interactions. Give your health care provider a list of all the medicines, herbs, non-prescription drugs, or dietary supplements you use. Also tell them if you smoke, drink alcohol, or use illegal drugs. Some items may interact with your medicine. What should I watch for while using this medication? Visit your care team for regular checks on your progress. Tell your care team if your symptoms do not start to get better or if they get worse. You may need blood work done while you are taking this medication. You may need to eat more foods that contain iron. Talk to your care team. Foods that contain iron include whole grains or cereals, dried fruits, beans, peas, leafy green vegetables, and organ meats (liver, kidney). What side effects may I notice from receiving this medication? Side effects that you should  report to your care team as soon as possible: Allergic reactions--skin rash, itching, hives, swelling of the face, lips, tongue, or throat Low blood pressure--dizziness, feeling faint or lightheaded, blurry vision Shortness of breath Side effects that usually do not require medical attention (report to your care team if they continue or are bothersome): Flushing Headache Joint pain Muscle pain Nausea Pain, redness, or irritation at injection site This list  may not describe all possible side effects. Call your doctor for medical advice about side effects. You may report side effects to FDA at 1-800-FDA-1088. Where should I keep my medication? This medication is given in a hospital or clinic. It will not be stored at home. NOTE: This sheet is a summary. It may not cover all possible information. If you have questions about this medicine, talk to your doctor, pharmacist, or health care provider.  2024 Elsevier/Gold Standard (2023-05-22 00:00:00)    To help prevent nausea and vomiting after your treatment, we encourage you to take your nausea medication as directed.  BELOW ARE SYMPTOMS THAT SHOULD BE REPORTED IMMEDIATELY: *FEVER GREATER THAN 100.4 F (38 C) OR HIGHER *CHILLS OR SWEATING *NAUSEA AND VOMITING THAT IS NOT CONTROLLED WITH YOUR NAUSEA MEDICATION *UNUSUAL SHORTNESS OF BREATH *UNUSUAL BRUISING OR BLEEDING *URINARY PROBLEMS (pain or burning when urinating, or frequent urination) *BOWEL PROBLEMS (unusual diarrhea, constipation, pain near the anus) TENDERNESS IN MOUTH AND THROAT WITH OR WITHOUT PRESENCE OF ULCERS (sore throat, sores in mouth, or a toothache) UNUSUAL RASH, SWELLING OR PAIN  UNUSUAL VAGINAL DISCHARGE OR ITCHING   Items with * indicate a potential emergency and should be followed up as soon as possible or go to the Emergency Department if any problems should occur.  Please show the CHEMOTHERAPY ALERT CARD or IMMUNOTHERAPY ALERT CARD at check-in to the Emergency Department and triage nurse.  Should you have questions after your visit or need to cancel or reschedule your appointment, please contact [facility] [place] - A DEPT OF [facility] [phone]  and follow the prompts.  Office hours are 8:00 a.m. to 4:30 p.m. Monday - Friday. Please note that voicemails left after 4:00 p.m. may not be returned until the following business day.  We are closed weekends and major holidays. You have access to a  nurse at all times for urgent questions. Please call the main number to the clinic [phone] and follow the prompts.  For any non-urgent questions, you may also contact your provider using MyChart. We now offer e-Visits for anyone 39 and older to request care online for non-urgent symptoms. For details visit [email or web address].   Also download the MyChart app! Go to the app store, search "MyChart", open the app, select [place], and log in with your MyChart username and password.

## 2024-02-03 ENCOUNTER — Telehealth (HOSPITAL_COMMUNITY): Payer: Self-pay

## 2024-02-03 NOTE — Telephone Encounter (Signed)
 Called to confirm/remind patient of their appointment [facility] on 02/04/2024 2:45.   Appointment:   [] Confirmed  [] Left mess   [] No answer/No voice mail  [] VM Full/unable to leave message  [] Phone not in service  Patient reminded to bring all medications and/or complete list.  Confirmed patient has transportation. Gave directions, instructed to utilize valet parking.

## 2024-02-04 ENCOUNTER — Ambulatory Visit (HOSPITAL_COMMUNITY)
Admit: 2024-02-04 | Discharge: 2024-02-04 | Disposition: A | Discharge status: Home / Self Care | Attending: Adult Health | Admitting: Adult Health

## 2024-02-04 VITALS — BP 106/70 | Ht 71.0 in | Wt 233.0 lb

## 2024-02-04 DIAGNOSIS — I2510 Atherosclerotic heart disease of native coronary artery without angina pectoris: Secondary | ICD-10-CM | POA: Diagnosis not present

## 2024-02-04 DIAGNOSIS — I5042 Chronic combined systolic (congestive) and diastolic (congestive) heart failure: Secondary | ICD-10-CM | POA: Diagnosis not present

## 2024-02-04 DIAGNOSIS — I5032 Chronic diastolic (congestive) heart failure: Secondary | ICD-10-CM | POA: Diagnosis not present

## 2024-02-04 DIAGNOSIS — D509 Iron deficiency anemia, unspecified: Secondary | ICD-10-CM | POA: Insufficient documentation

## 2024-02-04 DIAGNOSIS — R0602 Shortness of breath: Secondary | ICD-10-CM

## 2024-02-04 DIAGNOSIS — G4733 Obstructive sleep apnea (adult) (pediatric): Secondary | ICD-10-CM | POA: Diagnosis not present

## 2024-02-04 DIAGNOSIS — Z7984 Long term (current) use of oral hypoglycemic drugs: Secondary | ICD-10-CM | POA: Insufficient documentation

## 2024-02-04 DIAGNOSIS — R5383 Other fatigue: Secondary | ICD-10-CM | POA: Diagnosis not present

## 2024-02-04 DIAGNOSIS — Z87891 Personal history of nicotine dependence: Secondary | ICD-10-CM | POA: Insufficient documentation

## 2024-02-04 DIAGNOSIS — Z79899 Other long term (current) drug therapy: Secondary | ICD-10-CM | POA: Diagnosis not present

## 2024-02-04 DIAGNOSIS — R001 Bradycardia, unspecified: Secondary | ICD-10-CM | POA: Diagnosis not present

## 2024-02-04 DIAGNOSIS — Z7982 Long term (current) use of aspirin: Secondary | ICD-10-CM | POA: Insufficient documentation

## 2024-02-04 DIAGNOSIS — I4819 Other persistent atrial fibrillation: Secondary | ICD-10-CM | POA: Insufficient documentation

## 2024-02-04 DIAGNOSIS — N1832 Chronic kidney disease, stage 3b: Secondary | ICD-10-CM | POA: Insufficient documentation

## 2024-02-04 DIAGNOSIS — I25119 Atherosclerotic heart disease of native coronary artery with unspecified angina pectoris: Secondary | ICD-10-CM

## 2024-02-04 DIAGNOSIS — I130 Hypertensive heart and chronic kidney disease with heart failure and stage 1 through stage 4 chronic kidney disease, or unspecified chronic kidney disease: Secondary | ICD-10-CM | POA: Diagnosis not present

## 2024-02-04 LAB — BASIC METABOLIC PANEL WITH GFR
Anion gap: 13 (ref 5–15)
BUN: 32 mg/dL — ABNORMAL HIGH (ref 8–23)
CO2: 26 mmol/L (ref 22–32)
Calcium: 9.1 mg/dL (ref 8.9–10.3)
Chloride: 95 mmol/L — ABNORMAL LOW (ref 98–111)
Creatinine, Ser: 2.25 mg/dL — ABNORMAL HIGH (ref 0.61–1.24)
GFR, Estimated: 29 mL/min — ABNORMAL LOW (ref >60)
Glucose, Bld: 152 mg/dL — ABNORMAL HIGH (ref 70–99)
Potassium: 5.7 mmol/L — ABNORMAL HIGH (ref 3.5–5.1)
Sodium: 134 mmol/L — ABNORMAL LOW (ref 135–145)

## 2024-02-04 MED ORDER — TORSEMIDE 20 MG PO TABS
20.0000 mg | ORAL_TABLET | Freq: Two times a day (BID) | ORAL | 1 refills | Status: DC
Start: 2024-02-04 — End: 2024-05-19

## 2024-02-04 NOTE — Progress Notes (Signed)
 HEART & VASCULAR TRANSITION OF CARE CONSULT NOTE     Referring Physician:Dr Chattergee Meldon Sport, MD  Cardiology: Dr Londa Rival  Chief Complaint: Heart Failure   HPI: Referred to clinic by Dr Andre Band for heart failure consultation.   Mr Klippel isa 83 year old with a history of IDA, CAD, HTN, Persistent AF, Watchman procedure 2023, CAD, AVM, OSA, and HFpEF. Has had multiple GI studies for   Admitted with 01/13/24 with A/C HFpEF. Transferred to [facility] from [facility] for cath. Cath showed elevated filling pressures and nonobstructive CAD. Diuresed with IV lasix  and transitioned to torsemide  40 mg /20 mg. Discharged with 232 pounds. Discharged 01/28/24  Got Feraheme infusion 01/30/2024.   Complaining of fatigue. SOB with exertion. Denies PND/Orthopnea.Denies BRBPR. Appetite ok. No fever or chills. Weight at home 229-230 pounds. Taking all medications. Lives alone. Able to drive to appointments.   Cardiac Testing  Echo 2025 EF LVEF 55-60% RV mildly reduced   Cath 2025    Prox RCA to Mid RCA lesion is 40% stenosed.   RPDA lesion is 60% stenosed.  RA:       12 mmHg (mean) RV:       61/8, 12 mmHg PA:       63/17 mmHg (32 mean) PCWP: 16 mmHg (mean) with v waves to 26                                     Estimated Fick CO/CI   5.78L/min, 2.59L/min/m2                                           TPG  16  mmHg                                               PVR  2.7 Wood Units  PAPi  3.83    IMPRESSION: Moderate, nonobstructive coronary artery disease with mild progression from 2016. 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 Well compensated RV function. Normal cardiac output/index  Past Medical History:  Diagnosis Date   Arthritis    Atrial fibrillation (HCC)    CAD (coronary artery disease)    a. Cath 03/17/15 showing 100% ostial  D1, 50% prox LAD to mid LAD, 40% RPDA stenosis. Med rx. // Myoview  01/2020: EF 31 diffuse perfusion defect without reversibility (suspect artifact); reviewed with Dr. Lupe Salk study felt to be low risk   Cataract    Mixed form OD   Chronic diastolic CHF (congestive heart failure) (HCC)    Chronically elevated hemidiaphragm - Right Side    Diabetic retinopathy (HCC)    NPDR OU   Essential hypertension    Glaucoma    POAG OU   History of gout    Hyperlipidemia    Hypertensive retinopathy    OU   Kidney stones    Melanoma of neck (HCC)    NICM (nonischemic cardiomyopathy) (HCC)    OSA on CPAP 2012   Prostate cancer (HCC)    Type II diabetes mellitus (HCC)  Current Outpatient Medications  Medication Sig Dispense Refill   acetaminophen  (TYLENOL ) 650 MG CR tablet Take 650 mg by mouth every 8 (eight) hours as needed for pain. During the day time and Tylenol  PM at night     albuterol  (VENTOLIN  HFA) 108 (90 Base) MCG/ACT inhaler Inhale 2 puffs into the lungs every 6 (six) hours as needed for wheezing or shortness of breath.     ammonium lactate  (AMLACTIN DAILY) 12 % lotion Apply 1 Application topically as needed. 400 g 0   aspirin  81 MG chewable tablet Chew 81 mg by mouth daily.     camphor-menthol (ANTI-ITCH) lotion Apply 1 Application topically daily as needed for itching.     COMBIGAN  0.2-0.5 % ophthalmic solution Place 1 drop into both eyes 2 (two) times daily.     dapagliflozin  propanediol (FARXIGA ) 10 MG TABS tablet Take 1 tablet (10 mg total) by mouth daily. 30 tablet 0   diphenhydramine -acetaminophen  (TYLENOL  PM) 25-500 MG TABS tablet Take 1 tablet by mouth at bedtime.     ELDERBERRY PO Take 2 tablets by mouth daily. Chewable     ferrous sulfate  325 (65 FE) MG tablet Take 1 tablet (325 mg total) by mouth daily with breakfast. 120 tablet 1   fluticasone  (FLONASE ) 50 MCG/ACT nasal spray SPRAY 2 SPRAYS INTO EACH NOSTRIL EVERY DAY (Patient taking differently: Place 2 sprays into  both nostrils daily.) 48 mL 1   gemfibrozil  (LOPID ) 600 MG tablet Take 600 mg by mouth at bedtime.      glimepiride  (AMARYL ) 2 MG tablet Take 1 tablet (2 mg total) by mouth daily with breakfast. 30 tablet 0   Insulin  Glargine (BASAGLAR  KWIKPEN) 100 UNIT/ML Inject 45 Units into the skin at bedtime. (Patient taking differently: Inject 38 Units into the skin at bedtime.) 15 mL 3   magnesium  gluconate (MAGONATE) 500 MG tablet Take 500 mg by mouth daily.     mineral oil liquid Take 15 mLs by mouth daily as needed for moderate constipation.     Omega-3 Fatty Acids (FISH OIL EXTRA STRENGTH) 1200 MG CAPS Take 1 capsule by mouth daily.     OVER THE COUNTER MEDICATION Take 1 tablet by mouth daily. Beets Chew     pantoprazole  (PROTONIX ) 40 MG tablet TAKE 1 TABLET BY MOUTH EVERY DAY 90 tablet 1   potassium chloride  (KLOR-CON ) 10 MEQ tablet Take 1 tablet (10 mEq total) by mouth daily. 90 tablet 1   spironolactone  (ALDACTONE ) 25 MG tablet Take 1 tablet (25 mg total) by mouth daily. 30 tablet 0   torsemide  (DEMADEX ) 20 MG tablet 40 Mg in the morning and 20 Mg in the evening (Patient taking differently: Take 20-40 mg by mouth 2 (two) times daily. 20 Mg in the morning and 40 Mg in the evening)     vitamin E 45 MG (100 UNITS) capsule Take 100 Units by mouth daily.     zinc  gluconate 50 MG tablet Take 50 mg by mouth daily.     amiodarone  (PACERONE ) 200 MG tablet TAKE 1 TABLET BY MOUTH EVERY DAY (Patient not taking: Reported on 02/04/2024) 90 tablet 1   No current facility-administered medications for this encounter.    Allergies  Allergen Reactions   Azithromycin  Nausea Only   Tape Rash and Other (See Comments)    Causes skin redness, Use paper tape only.      Social History   Socioeconomic History   Marital status: Widowed    Spouse name: Not on file  Number of children: 1   Years of education: Not on file   Highest education level: High school graduate  Occupational History   Occupation: Retired   Tobacco Use   Smoking status: Former    Current packs/day: 0.00    Average packs/day: 2.0 packs/day for 27.0 years (54.0 ttl pk-yrs)    Types: Cigarettes    Start date: 5    Quit date: 1992    Years since quitting: 33.3    Passive exposure: Current   Smokeless tobacco: Never   Tobacco comments:    Former smoker 09/27/21  Vaping Use   Vaping status: Never Used  Substance and Sexual Activity   Alcohol use: No    Alcohol/week: 0.0 standard drinks of alcohol    Comment: "used to drink; stopped ~ 2008"   Drug use: No   Sexual activity: Not Currently  Other Topics Concern   Not on file  Social History Narrative   Lives in [place], [place] with wife.   Social Drivers of Corporate investment banker Strain: Low Risk  (01/27/2024)   Overall Financial Resource Strain (CARDIA)    Difficulty of Paying Living Expenses: Not very hard  Food Insecurity: No Food Insecurity (01/29/2024)   Hunger Vital Sign    Worried About Running Out of Food in the Last Year: Never true    Ran Out of Food in the Last Year: Never true  Transportation Needs: No Transportation Needs (01/29/2024)   PRAPARE - Administrator, Civil Service (Medical): No    Lack of Transportation (Non-Medical): No  Physical Activity: Inactive (11/20/2023)   Exercise Vital Sign    Days of Exercise per Week: 0 days    Minutes of Exercise per Session: 0 min  Stress: No Stress Concern Present (11/20/2023)   [employer] of Occupational Health - Occupational Stress Questionnaire    Feeling of Stress : Not at all  Social Connections: Patient Declined (01/14/2024)   Social Connection and Isolation Panel [NHANES]    Frequency of Communication with Friends and Family: Patient declined    Frequency of Social Gatherings with Friends and Family: Patient declined    Attends Religious Services: Patient declined    Database administrator or Organizations: Patient declined    Attends Banker Meetings: Patient declined     Marital Status: Patient declined  Recent Concern: Social Connections - Moderately Isolated (11/20/2023)   Social Connection and Isolation Panel [NHANES]    Frequency of Communication with Friends and Family: More than three times a week    Frequency of Social Gatherings with Friends and Family: Once a week    Attends Religious Services: More than 4 times per year    Active Member of [employer] or Organizations: No    Attends Banker Meetings: Never    Marital Status: Widowed  Intimate Partner Violence: Not At Risk (01/29/2024)   Humiliation, Afraid, Rape, and Kick questionnaire    Fear of Current or Ex-Partner: No    Emotionally Abused: No    Physically Abused: No    Sexually Abused: No      Family History  Problem Relation Age of Onset   Heart disease Mother    Lung cancer Mother    Heart attack Mother 34   Diabetes Father    Heart disease Father    Stroke Brother    Healthy Daughter    Colon cancer Maternal Aunt        [phone]  Vitals:   02/04/24 1450  BP: 106/70  SpO2: 98%  Weight: 105.7 kg (233 lb)  Height: 5\' 11"  (1.803 m)   Wt Readings from Last 3 Encounters:  02/04/24 105.7 kg (233 lb)  01/28/24 105.4 kg (232 lb 5.8 oz)  01/13/24 114.1 kg (251 lb 8.7 oz)    PHYSICAL EXAM: General:  Appears weak. No resp difficulty Neck: supple. no JVD.  Cor: PMI nondisplaced. Irregular rate & rhythm. No rubs, gallops or murmurs. Lungs: clear Abdomen: soft, nontender, nondistended.  Extremities: no cyanosis, clubbing, rash, edema Neuro: alert & oriented x3  ECG: A fib 42 bpm    ASSESSMENT & PLAN: 1. Chronic HFpEF Echo 2025 LVEF 55-60% RV mildly reduced.  NYHA III. Volume status appears low. Tonight he was instructed to hold torsemide  then tomorrow cut back torsemide  20 mg twice a day.  GDMT  BB-N/A Bradycardia. Asked him to verify he is not taking amiodarone .  Ace/ARB/ARNI- BP soft.CKD Stage III MRA- Continue Spiro 25 mg daily  SGLT2i- Continue farxiga   2.  CAD Recent cath Moderate, nonobstructive coronary artery disease with mild progression from 2016. On aspirin  81 mg daily  No chest pain.   3. Persistent A fib  S/P Watchman Procedure 2023. No longer on anticoagulation.  Previously on Amio. He said he has amio stopped.  EKG today A fib 42 bpm.  I asked him to verify he is not taking amio.   4. Anemia  Has labs next week. No obvious source of bleeding.  [facility] and getting Iron  Infusion.   5. CKD Stage IIIb Check BMET today.   Referred to HFSW (PCP, Medications, Transportation, ETOH Abuse, Drug Abuse, Insurance, Financial ):  No Refer to Pharmacy: No Refer to Home Health: No Refer to Advanced Heart Failure Clinic: No  Refer to General Cardiology:Back to Dr Londa Rival.   Follow up as needed.   Corbyn Steedman NP-C  3:31 PM

## 2024-02-04 NOTE — Patient Instructions (Signed)
 Great to see you today!!!  Medication Changes:  STOP Amiodarone   DO NOT TAKE TORSMIDE TODAY, STARTING Tomorrow 02/05/24 take Torsemide  20 mg Twice daily   Lab Work:  Labs done today, your results will be available in MyChart, we will contact you for abnormal readings.   Special Instructions // Education:  Do the following things EVERYDAY: Weigh yourself in the morning before breakfast. Write it down and keep it in a log. Take your medicines as prescribed Eat low salt foods--Limit salt (sodium) to 2000 mg per day.  Stay as active as you can everyday Limit all fluids for the day to less than 2 liters   Follow-Up in: Thank you for allowing us  to provider your heart failure care after your recent hospitalization. Please follow-up with [facility] as scheduled   If you have any questions, issues, or concerns before your next appointment please call our office at [phone], opt. [phone] and leave a message for the triage nurse.

## 2024-02-05 ENCOUNTER — Telehealth (HOSPITAL_COMMUNITY): Payer: Self-pay | Admitting: *Deleted

## 2024-02-05 DIAGNOSIS — I5042 Chronic combined systolic (congestive) and diastolic (congestive) heart failure: Secondary | ICD-10-CM

## 2024-02-05 NOTE — Telephone Encounter (Signed)
-----   Message from Nieves Bars sent at 02/05/2024  4:29 PM EDT ----- Stop Spironolactone . Creatinine elevated. Hold torsemide  x 2 days then start torsemide  20 mg twice a day.  Repeat BMET

## 2024-02-05 NOTE — Telephone Encounter (Signed)
 Per Nieves Bars, NP Stop KCL and Spiro, hold Tors then restart at 20 mg BID, repeat labs in 1wk. Spoke w/pt, he is aware, agreeable, and verbalized understanding, med list updated, lab order placed for AP

## 2024-02-06 NOTE — Progress Notes (Signed)
 [facility] - Clinic Note  02/07/2024    CHIEF COMPLAINT Patient presents for Retina Follow Up  HISTORY OF PRESENT ILLNESS: Lucas Dudley is a 83 y.o. male who presents to the clinic today for:  HPI     Retina Follow Up   Patient presents with  Diabetic Retinopathy.  In both eyes.  Severity is mild.  Duration of 5 weeks.  Since onset it is stable.  I, the attending physician,  performed the HPI with the patient and updated documentation appropriately.        Comments   Patient feels the vision is the same. Pt was in the hospital for the first two weeks of April for possible pneumonia and fluid issues. He is using Combigan  OU BID.  His blood sugar was 113.      Last edited by Ronelle Coffee, MD on 02/07/2024  2:52 PM.    Pt states he was hospitalized for 2 weeks at the beginning of April for pneumonia and retained fluid, he states it stemmed from his CHF   Referring physician: Meldon Sport, MD [address] [place],  [place] [address]  HISTORICAL INFORMATION:   Selected notes from the MEDICAL RECORD NUMBER Referred by Dr. Reyne Cave for DEE   CURRENT MEDICATIONS: Current Outpatient Medications (Ophthalmic Drugs)  Medication Sig   COMBIGAN  0.2-0.5 % ophthalmic solution Place 1 drop into both eyes 2 (two) times daily.   No current facility-administered medications for this visit. (Ophthalmic Drugs)   Current Outpatient Medications (Other)  Medication Sig   acetaminophen  (TYLENOL ) 650 MG CR tablet Take 650 mg by mouth every 8 (eight) hours as needed for pain. During the day time and Tylenol  PM at night   albuterol  (VENTOLIN  HFA) 108 (90 Base) MCG/ACT inhaler Inhale 2 puffs into the lungs every 6 (six) hours as needed for wheezing or shortness of breath.   ammonium lactate  (AMLACTIN DAILY) 12 % lotion Apply 1 Application topically as needed.   aspirin  81 MG chewable tablet Chew 81 mg by mouth daily.   camphor-menthol (ANTI-ITCH) lotion Apply 1 Application  topically daily as needed for itching.   dapagliflozin  propanediol (FARXIGA ) 10 MG TABS tablet Take 1 tablet (10 mg total) by mouth daily.   diphenhydramine -acetaminophen  (TYLENOL  PM) 25-500 MG TABS tablet Take 1 tablet by mouth at bedtime.   ELDERBERRY PO Take 2 tablets by mouth daily. Chewable   ferrous sulfate  325 (65 FE) MG tablet Take 1 tablet (325 mg total) by mouth daily with breakfast.   fluticasone  (FLONASE ) 50 MCG/ACT nasal spray SPRAY 2 SPRAYS INTO EACH NOSTRIL EVERY DAY (Patient taking differently: Place 2 sprays into both nostrils daily.)   gemfibrozil  (LOPID ) 600 MG tablet Take 600 mg by mouth at bedtime.    glimepiride  (AMARYL ) 2 MG tablet Take 1 tablet (2 mg total) by mouth daily with breakfast.   Insulin  Glargine (BASAGLAR  KWIKPEN) 100 UNIT/ML Inject 45 Units into the skin at bedtime. (Patient taking differently: Inject 38 Units into the skin at bedtime.)   magnesium  gluconate (MAGONATE) 500 MG tablet Take 500 mg by mouth daily.   mineral oil liquid Take 15 mLs by mouth daily as needed for moderate constipation.   Omega-3 Fatty Acids (FISH OIL EXTRA STRENGTH) 1200 MG CAPS Take 1 capsule by mouth daily.   OVER THE COUNTER MEDICATION Take 1 tablet by mouth daily. Beets Chew   pantoprazole  (PROTONIX ) 40 MG tablet TAKE 1 TABLET BY MOUTH EVERY DAY   torsemide  (DEMADEX )  20 MG tablet Take 1 tablet (20 mg total) by mouth 2 (two) times daily.   vitamin E 45 MG (100 UNITS) capsule Take 100 Units by mouth daily.   zinc  gluconate 50 MG tablet Take 50 mg by mouth daily.   No current facility-administered medications for this visit. (Other)   REVIEW OF SYSTEMS: ROS   Positive for: Gastrointestinal, Endocrine, Cardiovascular, Eyes, Respiratory Negative for: Constitutional, Neurological, Skin, Genitourinary, Musculoskeletal, HENT, Psychiatric, Allergic/Imm, Heme/Lymph Last edited by Olene Berne, COT on 02/07/2024 12:50 PM.      ALLERGIES Allergies  Allergen Reactions    Azithromycin  Nausea Only   Tape Rash and Other (See Comments)    Causes skin redness, Use paper tape only.   PAST MEDICAL HISTORY Past Medical History:  Diagnosis Date   Arthritis    Atrial fibrillation (HCC)    CAD (coronary artery disease)    a. Cath 03/17/15 showing 100% ostial D1, 50% prox LAD to mid LAD, 40% RPDA stenosis. Med rx. // Myoview  01/2020: EF 31 diffuse perfusion defect without reversibility (suspect artifact); reviewed with Dr. Lupe Salk study felt to be low risk   Cataract    Mixed form OD   Chronic diastolic CHF (congestive heart failure) (HCC)    Chronically elevated hemidiaphragm - Right Side    Diabetic retinopathy (HCC)    NPDR OU   Essential hypertension    Glaucoma    POAG OU   History of gout    Hyperlipidemia    Hypertensive retinopathy    OU   Kidney stones    Melanoma of neck (HCC)    NICM (nonischemic cardiomyopathy) (HCC)    OSA on CPAP 2012   Prostate cancer (HCC)    Type II diabetes mellitus (HCC)    Past Surgical History:  Procedure Laterality Date   ABDOMINAL HERNIA REPAIR     w/mesh   ATRIAL FIBRILLATION ABLATION N/A 06/06/2021   Procedure: ATRIAL FIBRILLATION ABLATION;  Surgeon: Jolly Needle, MD;  Location: [facility] INVASIVE CV LAB;  Service: Cardiovascular;  Laterality: N/A;   BIOPSY  05/24/2022   Procedure: BIOPSY;  Surgeon: Suzette Espy, MD;  Location: [facility];  Service: Endoscopy;;   BIOPSY  05/30/2022   Procedure: BIOPSY;  Surgeon: Suzette Espy, MD;  [facility]: [facility];  Service: Endoscopy;;   BIOPSY  01/10/2023   Procedure: BIOPSY;  Surgeon: Normie Becton., MD;  Location: [facility];  Service: Gastroenterology;;   CARDIAC CATHETERIZATION N/A 03/17/2015   Procedure: Left Heart Cath and Coronary Angiography;  Surgeon: Avanell Leigh, MD;  Location: [facility] INVASIVE CV LAB;  Service: Cardiovascular;  Laterality: N/A;   CARDIOVERSION N/A 08/09/2021   Procedure: CARDIOVERSION;  Surgeon: Maudine Sos, MD;   Location: [facility] ENDOSCOPY;  Service: Cardiovascular;  Laterality: N/A;   CARDIOVERSION N/A 11/10/2021   Procedure: CARDIOVERSION;  Surgeon: Jerryl Morin, DO;  Location: [facility] ENDOS[facility];  Service: Cardiovascular;  Laterality: N/A;   carotid doppler  09/17/2008   rigt and left ICAs 0-49%;mildly  abnormal   CATARACT EXTRACTION Left 2020   Dr. Reyne Cave   COLONOSCOPY N/A 05/16/2017   Procedure: COLONOSCOPY;  Surgeon: Ruby Corporal, MD;  [facility]: [facility];  Service: Endoscopy;  Laterality: N/A;  930   COLONOSCOPY WITH PROPOFOL  N/A 05/24/2022   Procedure: COLONOSCOPY WITH PROPOFOL ;  Surgeon: Suzette Espy, MD;  [facility]: [facility];  Service: Endoscopy;  Laterality: N/A;   DOPPLER ECHOCARDIOGRAPHY  05/25/2009   EF 50-55%,LA mildly dilated, LV function normal  ELECTROPHYSIOLOGIC STUDY N/A 04/05/2015   Procedure: Cardioversion;  Surgeon: Luana Rumple, MD;  Location: [facility];  Service: Cardiovascular;  Laterality: N/A;   ELECTROPHYSIOLOGIC STUDY N/A 09/06/2015   Procedure: Atrial Fibrillation Ablation;  Surgeon: Jolly Needle, MD;  Location: [facility] INVASIVE CV LAB;  Service: Cardiovascular;  Laterality: N/A;   ELECTROPHYSIOLOGIC STUDY N/A 07/12/2016   redo afib ablation by Dr Nunzio Belch   ENTEROSCOPY N/A 05/30/2022   Procedure: ENTEROSCOPY;  Surgeon: Suzette Espy, MD;  [facility];  Service: Endoscopy;  Laterality: N/A;   ENTEROSCOPY N/A 01/07/2024   Procedure: ENTEROSCOPY;  Surgeon: Urban Garden, MD;  [facility];  Service: Gastroenterology;  Laterality: N/A;  1:45PM;ASA 3   ESOPHAGOGASTRODUODENOSCOPY (EGD) WITH PROPOFOL  N/A 05/24/2022   Procedure: ESOPHAGOGASTRODUODENOSCOPY (EGD) WITH PROPOFOL ;  Surgeon: Suzette Espy, MD;  [facility];  Service: Endoscopy;  Laterality: N/A;   ESOPHAGOGASTRODUODENOSCOPY (EGD) WITH PROPOFOL  N/A 01/10/2023   Procedure: ESOPHAGOGASTRODUODENOSCOPY (EGD) WITH PROPOFOL ;  Surgeon: Brice Campi Albino Alu., MD;   Location: [facility];  Service: Gastroenterology;  Laterality: N/A;   EUS N/A 01/10/2023   Procedure: UPPER ENDOSCOPIC ULTRASOUND (EUS) RADIAL;  Surgeon: Normie Becton., MD;  Location: [facility]OS[facility];  Service: Gastroenterology;  Laterality: N/A;   EXCISIONAL HEMORRHOIDECTOMY     "inside and out"   EYE SURGERY Left 2020   Cat Sx - Dr. Reyne Cave   FINE NEEDLE ASPIRATION Right    knee; "drew ~ 1 quart off"   GIVENS CAPSULE STUDY N/A 05/25/2022   Procedure: GIVENS CAPSULE STUDY;  Surgeon: Vinetta Greening, DO;  [facility];  Service: Endoscopy;  Laterality: N/A;   GIVENS CAPSULE STUDY N/A 01/23/2023   Procedure: GIVENS CAPSULE STUDY;  Surgeon: Urban Garden, MD;  [facility];  Service: Gastroenterology;  Laterality: N/A;  8:30am   HERNIA REPAIR     HOT HEMOSTASIS N/A 01/10/2023   Procedure: HOT HEMOSTASIS (ARGON PLASMA COAGULATION/BICAP);  Surgeon: Normie Becton., MD;  Location: [facility];  Service: Gastroenterology;  Laterality: N/A;   HOT HEMOSTASIS  01/07/2024   Procedure: EGD, WITH ARGON PLASMA COAGULATION;  Surgeon: Urban Garden, MD;  [facility];  Service: Gastroenterology;;   LAPAROSCOPIC CHOLECYSTECTOMY     LEFT ATRIAL APPENDAGE OCCLUSION N/A 06/28/2022   Procedure: LEFT ATRIAL APPENDAGE OCCLUSION;  Surgeon: Arnoldo Lapping, MD;  Location: [facility] INVASIVE CV LAB;  Service: Cardiovascular;  Laterality: N/A;   MELANOMA EXCISION Right    "neck"   NM MYOCAR PERF WALL MOTION  02/21/2012   EF 61% ,EXERCISE 7 METS. exercise stopped due to wheezing and shortness of breathe   POLYPECTOMY  05/16/2017   Procedure: POLYPECTOMY;  Surgeon: Ruby Corporal, MD;  [facility];  Service: Endoscopy;;  colon   POLYPECTOMY  05/24/2022   Procedure: POLYPECTOMY;  Surgeon: Suzette Espy, MD;  [facility];  Service: Endoscopy;;   POLYPECTOMY  01/10/2023   Procedure: POLYPECTOMY;  Surgeon: Brice Campi Albino Alu.,  MD;  Location: [facility];  Service: Gastroenterology;;   PROSTATECTOMY     RIGHT/LEFT HEART CATH AND CORONARY ANGIOGRAPHY N/A 01/27/2024   Procedure: RIGHT/LEFT HEART CATH AND CORONARY ANGIOGRAPHY;  Surgeon: Lauralee Poll, MD;  Location: [facility] INVASIVE CV LAB;  Service: Cardiovascular;  Laterality: N/A;   SHOULDER OPEN ROTATOR CUFF REPAIR Right X 2   SUBMUCOSAL TATTOO INJECTION  05/30/2022   Procedure: SUBMUCOSAL TATTOO INJECTION;  Surgeon: Suzette Espy, MD;  [facility];  Service: Endoscopy;;   SUBMUCOSAL TATTOO INJECTION  01/10/2023   Procedure: SUBMUCOSAL TATTOO INJECTION;  Surgeon: Normie Becton., MD;  Location: [facility];  Service: Gastroenterology;;   TEE WITHOUT CARDIOVERSION N/A 09/05/2015   Procedure: TRANSESOPHAGEAL ECHOCARDIOGRAM (TEE);  Surgeon: Jacqueline Matsu, MD;  Location: [facility] ENDOSCOPY;  Service: Cardiovascular;  Laterality: N/A;   TEE WITHOUT CARDIOVERSION N/A 06/28/2022   Procedure: TRANSESOPHAGEAL ECHOCARDIOGRAM (TEE);  Surgeon: Arnoldo Lapping, MD;  Location: [facility] INVASIVE CV LAB;  Service: Cardiovascular;  Laterality: N/A;   FAMILY HISTORY Family History  Problem Relation Age of Onset   Heart disease Mother    Lung cancer Mother    Heart attack Mother 52   Diabetes Father    Heart disease Father    Stroke Brother    Healthy Daughter    Colon cancer Maternal Aunt        18s   SOCIAL HISTORY Social History   Tobacco Use   Smoking status: Former    Current packs/day: 0.00    Average packs/day: 2.0 packs/day for 27.0 years (54.0 ttl pk-yrs)    Types: Cigarettes    Start date: 44    Quit date: 1992    Years since quitting: 33.3    Passive exposure: Current   Smokeless tobacco: Never   Tobacco comments:    Former smoker 09/27/21  Vaping Use   Vaping status: Never Used  Substance Use Topics   Alcohol use: No    Alcohol/week: 0.0 standard drinks of alcohol    Comment: "used to drink; stopped ~ 2008"   Drug use: No        OPHTHALMIC EXAM: Base Eye Exam     Visual Acuity (Snellen - Linear)       Right Left   Dist cc 20/20 +1 20/25   Dist ph cc  NI    Correction: Glasses         Tonometry (Tonopen, 12:54 PM)       Right Left   Pressure 17 19         Pupils       Dark Light Shape React APD   Right 2 1 Round Brisk None   Left 2 1 Round Brisk None         Visual Fields       Left Right    Full Full         Extraocular Movement       Right Left    Full, Ortho Full, Ortho         Neuro/Psych     Oriented x3: Yes   Mood/Affect: Normal         Dilation     Both eyes: 1.0% Mydriacyl, 2.5% Phenylephrine  @ 12:50 PM           Slit Lamp and Fundus Exam     Slit Lamp Exam       Right Left   Lids/Lashes Dermato, mild MGD Dermato, mild MGD   Conjunctiva/Sclera Temporal pinguecula, mild inferior sub conj heme Temporal pinguecula   Cornea EBMD, mild haze, trace PEE, mild Debris in tear film, well healed cataract wound trace haze, trace PEE, well healed temporal cataract wounds, arcus   Anterior Chamber Deep and clear; narrow temporal angle Deep and clear   Iris Round and dilated, mild anterior bowing, No NVI Round and moderately dilated to 5.70mm   Lens PCIOL in good position, trace PCO PC IOL in good position with open PC   Anterior  Vitreous Synerisis Synerisis         Fundus Exam       Right Left   Disc Mild pallor, sharp rim, +cupping, thin inferior rim Mild pallor, sharp rim, +cupping, +PPA   C/D Ratio 0.7 0.7   Macula Flat, good foveal reflex, cystic changes - slightly improved, RPE mottling and clumping, no heme, Drusen, no exudates -- resolved Flat, blunted foveal reflex, trace cystic changes, punctate MA -- improved   Vessels attenuated, Tortuous attenuated, Tortuous   Periphery Attached, rare MA, focal DBH nasal to disc--improved Attached. No heme.           Refraction     Wearing Rx       Sphere Cylinder Axis Add   Right -0.25 +2.00 158 +2.50    Left -1.00 +1.25 008 +2.50           IMAGING AND PROCEDURES  Imaging and Procedures for 02/07/2024  OCT, Retina - OU - Both Eyes       Right Eye Quality was good. Central Foveal Thickness: 283. Progression has improved. Findings include no SRF, abnormal foveal contour, intraretinal fluid, vitreomacular adhesion (persistent IRF/IRHM -- slightly improved, partial PVD).   Left Eye Quality was good. Central Foveal Thickness: 287. Progression has been stable. Findings include normal foveal contour, no IRF, no SRF, intraretinal hyper-reflective material (Trace persistent cystic changes and punctate IRHM inferior fovea; partial PVD).   Notes *Images captured and stored on drive  Diagnosis / Impression:  +DME OU OD: persistent IRF/IRHM -- slightly improved, partial PVD OS: Trace persistent cystic changes and punctate IRHM inferior fovea; partial PVD  Clinical management:  See below  Abbreviations: NFP - Normal foveal profile. CME - cystoid macular edema. PED - pigment epithelial detachment. IRF - intraretinal fluid. SRF - subretinal fluid. EZ - ellipsoid zone. ERM - epiretinal membrane. ORA - outer retinal atrophy. ORT - outer retinal tubulation. SRHM - subretinal hyper-reflective material. IRHM - intraretinal hyper-reflective material     Intravitreal Injection, Pharmacologic Agent - OD - Right Eye       Time Out 02/07/2024. 1:36 PM. Confirmed correct patient, procedure, site, and patient consented.   Anesthesia Topical anesthesia was used. Anesthetic medications included Lidocaine  2%, Proparacaine 0.5%.   Procedure Preparation included 5% betadine to ocular surface, eyelid speculum. A (32g) needle was used.   Injection: 6 mg faricimab -svoa 6 MG/0.05ML (Patient supplied)   Route: Intravitreal, Site: Right Eye   NDC: 16109-604-54, Lot: [id number], Expiration date: 06/14/2025, Waste: 0 mL   Post-op Post injection exam found visual acuity of at least counting fingers. The  patient tolerated the procedure well. There were no complications. The patient received written and verbal post procedure care education.   Notes **SAMPLE MEDICATION ADMINISTERED**      Intravitreal Injection, Pharmacologic Agent - OS - Left Eye       Time Out 02/07/2024. 1:36 PM. Confirmed correct patient, procedure, site, and patient consented.   Anesthesia Topical anesthesia was used. Anesthetic medications included Lidocaine  2%, Proparacaine 0.5%.   Procedure Preparation included 5% betadine to ocular surface, eyelid speculum. A (32g) needle was used.   Injection: 6 mg faricimab -svoa 6 MG/0.05ML (Patient supplied)   Route: Intravitreal, Site: Left Eye   NDC: 78295-621-30, Lot: [id number], Expiration date: 06/14/2025, Waste: 0 mL   Post-op Post injection exam found visual acuity of at least counting fingers. The patient tolerated the procedure well. There were no complications. The patient received written and verbal post procedure care education. Post injection medications  were not given.   Notes **SAMPLE MEDICATION ADMINISTERED**           ASSESSMENT/PLAN:   ICD-10-CM   1. 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, associated with type 2 diabetes mellitus (HCC)  E11.3213 OCT, Retina - OU - Both Eyes    Intravitreal Injection, Pharmacologic Agent - OD - Right Eye    Intravitreal Injection, Pharmacologic Agent - OS - Left Eye    faricimab -svoa (VABYSMO ) 6mg /0.54mL intravitreal injection    faricimab -svoa (VABYSMO ) 6mg /0.52mL intravitreal injection    2. Current use of insulin  (HCC)  Z79.4     3. Long term (current) use of oral hypoglycemic drugs  Z79.84     4. Essential hypertension  I10     5. Hypertensive retinopathy of both eyes  H35.033     6. Pseudophakia of both eyes  Z96.1     7. 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 History of herpes zoster of eye  Z86.19     9. Primary open angle  glaucoma of both eyes, unspecified glaucoma stage  H40.1130       1-3. 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 Single focal MA superior to fovea; OS: Perifoveal Mas w/ late leakage - s/p IVA OS # 1(07.26.21), #2 (08.24.21), #3 (09.21.21), #4 (10.22.21) -- IVA resistance ========================================================== - s/p IVE OD #1 (09.29.23), #2 (10.30.23), #3 (SAMPLE) (11.27.23) #4 (12.29.23) #5 (01.26.24) #6 (02.23.24) -- IVE resistance - s/p IVE OS #1 (11.22.21), #2 (12.21.21), #3 (01.20.22), #4 (02.18.22), #5 (03.18.22), #6 (04.15.22), #7 (05.10.22) -- sample, #8 (06.08.22), #9 (07.12.22), #10 (08.12.22), #11 (09.16.22), #12 (10.21.22), #13 (11.18.22), #14 (12.21.22), #15 (01.20.23), #16 (02.17.23), #17 (03.24.23), #18 (04.21.23), #19 (05.26.23), #20 (06.23.23) -- IVE resistance =========================================================== - s/p IVV OD #1 (03.22.24), #2 (04.19.24), #3 (05.17.24), #4 (06.18.24), #5 (07.19.24), #6 (08.23.24), #7 (09.27.24), #8 (11.01.24), #9 (12.06.24), #10 (01.10.25 -- sample), #11 (02.14.25 -- sample) #12 (03.21.25 -- sample) - s/p IVV OS #1 (07.21.23), #2 (08.25.23), #3 (09.29.23), #4 (10.30.23), #5 (11.27.23) #6 (12.29.23), #7 (01.26.23), #8 (02.23.24), #9 (03.22.24), #10 (04.19.24), #11 (05.17.24), #12 (12.06.24), #13 (02.14.25 -- sample) #14 (03.21.25 -- sample)  ============================================================ - BCVA OD: 20/20 -- stable; OS 20/20 from 20/30 - OCT shows OD: persistent IRF/IRHM -- slightly improved, partial PVD; OS: Trace persistent cystic changes and punctate IRHM inferior fovea; partial PVD at 5 wks - recommend IVV OD #13 and IVV OS #15 [both samples] today, 04.25 25 with f/u in 5 weeks [[employer] funding unavailable] - treating OS prn - pt wishes to proceed with injections OU  - RBA of procedure discussed, questions answered  - Vabysmo  informed consent obtained and  signed, 02.14.25 (OU) - [place]  approved until 10/04/23 - [employer] approved Vabysmo , no good days for 2025 as of now - see procedure note - f/u 5 weeks -- DFE/OCT, possible injection(s)  4,5. Hypertensive retinopathy OU - discussed importance of tight BP control - continue to monitor  6. Pseudophakia OU  - s/p CE/IOL OS (2020, Dr. Reyne Cave)             - s/p CE/IOL OD (2022, Dr. Reyne Cave)  - s/p YAG cap OS (04.04.23) - BCVA 20/30 OD and 20/25 OS from 20/150  - IOLs in good position, doing well  - continue to monitor  7. 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 Lunda Salines in August 2022  -  s/p SuperK OS on 12.12.2022  8. History of herpes zoster/iritis OS  - on Valtrex  1 g daily---maintenance  9. POAG OU  - was under the expert management of Dr. Reyne Cave, now follows with Dr. Carloyn Chi  - IOP 17,19  - continue Cosopt  bid OU  Ophthalmic Meds Ordered this visit:  Meds ordered this encounter  Medications   faricimab -svoa (VABYSMO ) 6mg /0.29mL intravitreal injection   faricimab -svoa (VABYSMO ) 6mg /0.48mL intravitreal injection     This document serves as a record of services personally performed by Jeanice Millard, MD, PhD. It was created on their behalf by Eller Gut COT, an ophthalmic technician. The creation of this record is the provider's dictation and/or activities during the visit.    Electronically signed by: Eller Gut COT 04.24.25 2:52 PM   Jeanice Millard, M.D., Ph.D. Diseases & Surgery of the Retina and Vitreous [facility] 02/07/2024   I have reviewed the above documentation for accuracy and completeness, and I agree with the above. Jeanice Millard, M.D., Ph.D. 02/07/24 2:53 PM   Abbreviations: M myopia (nearsighted); A astigmatism; H hyperopia (farsighted); P presbyopia; Mrx spectacle prescription;  CTL contact lenses; OD right eye; OS left eye; OU both eyes  XT exotropia; ET esotropia; PEK punctate epithelial keratitis; PEE punctate  epithelial erosions; DES dry eye syndrome; MGD meibomian gland dysfunction; ATs artificial tears; PFAT's preservative free artificial tears; NSC nuclear sclerotic cataract; PSC posterior subcapsular cataract; ERM epi-retinal membrane; PVD posterior vitreous detachment; RD retinal detachment; DM diabetes mellitus; DR diabetic retinopathy; NPDR non-proliferative diabetic retinopathy; PDR proliferative diabetic retinopathy; CSME clinically significant macular edema; DME diabetic macular edema; dbh dot blot hemorrhages; CWS cotton wool spot; POAG primary open angle glaucoma; C/D cup-to-disc ratio; HVF humphrey visual field; GVF goldmann visual field; OCT optical coherence tomography; IOP intraocular pressure; BRVO Branch retinal vein occlusion; CRVO central retinal vein occlusion; CRAO central retinal artery occlusion; BRAO branch retinal artery occlusion; RT retinal tear; SB scleral buckle; PPV pars plana vitrectomy; VH Vitreous hemorrhage; PRP panretinal laser photocoagulation; IVK intravitreal kenalog; VMT vitreomacular traction; MH Macular hole;  NVD neovascularization of the disc; NVE neovascularization elsewhere; AREDS age related eye disease study; ARMD age related macular degeneration; POAG primary open angle glaucoma; EBMD epithelial/anterior basement membrane dystrophy; ACIOL anterior chamber intraocular lens; IOL intraocular lens; PCIOL posterior chamber intraocular lens; Phaco/IOL phacoemulsification with intraocular lens placement; PRK photorefractive keratectomy; LASIK laser assisted in situ keratomileusis; HTN hypertension; DM diabetes mellitus; COPD chronic obstructive pulmonary disease

## 2024-02-07 ENCOUNTER — Encounter (INDEPENDENT_AMBULATORY_CARE_PROVIDER_SITE_OTHER): Payer: Self-pay | Admitting: Ophthalmology

## 2024-02-07 ENCOUNTER — Ambulatory Visit (INDEPENDENT_AMBULATORY_CARE_PROVIDER_SITE_OTHER): Admitting: Ophthalmology

## 2024-02-07 DIAGNOSIS — Z794 Long term (current) use of insulin: Secondary | ICD-10-CM | POA: Diagnosis not present

## 2024-02-07 DIAGNOSIS — H401130 Primary open-angle glaucoma, bilateral, stage unspecified: Secondary | ICD-10-CM

## 2024-02-07 DIAGNOSIS — I10 Essential (primary) hypertension: Secondary | ICD-10-CM

## 2024-02-07 DIAGNOSIS — E113213 Type 2 diabetes mellitus with mild nonproliferative diabetic retinopathy with macular edema, bilateral: Secondary | ICD-10-CM | POA: Diagnosis not present

## 2024-02-07 DIAGNOSIS — Z7984 Long term (current) use of oral hypoglycemic drugs: Secondary | ICD-10-CM | POA: Diagnosis not present

## 2024-02-07 DIAGNOSIS — Z8619 Personal history of other infectious and parasitic diseases: Secondary | ICD-10-CM

## 2024-02-07 DIAGNOSIS — Z961 Presence of intraocular lens: Secondary | ICD-10-CM

## 2024-02-07 DIAGNOSIS — H18523 Epithelial (juvenile) corneal dystrophy, bilateral: Secondary | ICD-10-CM

## 2024-02-07 DIAGNOSIS — H35033 Hypertensive retinopathy, bilateral: Secondary | ICD-10-CM

## 2024-02-07 MED ORDER — FARICIMAB-SVOA 6 MG/0.05ML IZ SOLN
6.0000 mg | INTRAVITREAL | Status: AC | PRN
  Administered 2024-02-07: 6 mg via INTRAVITREAL

## 2024-02-10 ENCOUNTER — Encounter: Attending: Gastroenterology | Admitting: Emergency Medicine

## 2024-02-10 ENCOUNTER — Ambulatory Visit: Payer: Medicare Other | Admitting: Internal Medicine

## 2024-02-10 VITALS — BP 134/72 | HR 84 | Temp 98.3°F | Resp 18

## 2024-02-10 VITALS — BP 116/76 | HR 92 | Ht 70.5 in | Wt 233.6 lb

## 2024-02-10 DIAGNOSIS — I10 Essential (primary) hypertension: Secondary | ICD-10-CM | POA: Diagnosis not present

## 2024-02-10 DIAGNOSIS — I5033 Acute on chronic diastolic (congestive) heart failure: Secondary | ICD-10-CM

## 2024-02-10 DIAGNOSIS — E1169 Type 2 diabetes mellitus with other specified complication: Secondary | ICD-10-CM

## 2024-02-10 DIAGNOSIS — D500 Iron deficiency anemia secondary to blood loss (chronic): Secondary | ICD-10-CM

## 2024-02-10 DIAGNOSIS — J9601 Acute respiratory failure with hypoxia: Secondary | ICD-10-CM | POA: Diagnosis not present

## 2024-02-10 DIAGNOSIS — K5520 Angiodysplasia of colon without hemorrhage: Secondary | ICD-10-CM

## 2024-02-10 DIAGNOSIS — I5042 Chronic combined systolic (congestive) and diastolic (congestive) heart failure: Secondary | ICD-10-CM

## 2024-02-10 DIAGNOSIS — Z09 Encounter for follow-up examination after completed treatment for conditions other than malignant neoplasm: Secondary | ICD-10-CM | POA: Diagnosis not present

## 2024-02-10 DIAGNOSIS — Z794 Long term (current) use of insulin: Secondary | ICD-10-CM

## 2024-02-10 MED ORDER — OCTREOTIDE ACETATE 20 MG IM KIT
20.0000 mg | PACK | Freq: Once | INTRAMUSCULAR | Status: AC
Start: 2024-02-10 — End: 2024-02-10
  Administered 2024-02-10: 20 mg via INTRAMUSCULAR

## 2024-02-10 MED ORDER — DAPAGLIFLOZIN PROPANEDIOL 10 MG PO TABS: 10.0000 mg | ORAL_TABLET | Freq: Every day | ORAL | 1 refills | Status: DC

## 2024-02-10 NOTE — Assessment & Plan Note (Deleted)
 Has had exertional dyspnea and fatigue, could be due to acute on chronic CHF Recently increased dose of torsemide to 20 mg every morning and 40 mg every afternoon Daily weight checks Followed by cardiology Held Coreg and lisinopril recently Has chronic leg swelling and orthopnea

## 2024-02-10 NOTE — Progress Notes (Signed)
 Diagnosis: AVM (arteriovenous malformation) of small bowel   Provider:  Samantha Cress MD  Procedure: Injection  Sandostatin , Dose: 20 mg, Site: intramuscular, Number of injections: 1  Injection Site(s): Left upper quad. gluteus  Post Care: Observation period completed  Discharge: Condition: Good, Destination: Home . AVS Provided  Performed by:  Arlina Benjamin, RN

## 2024-02-10 NOTE — Assessment & Plan Note (Addendum)
 BP Readings from Last 1 Encounters:  02/10/24 116/76   Well controlled with Torsemide  20 mg QD Held Hydralazine  for hypotension during recent hospitalization Held Coreg  due to bradycardia and lisinopril  40 mg QD due to recent worsening of kidney function BP wnl currently, no change in medications for now Counseled for compliance with the medications Advised DASH diet and moderate exercise/walking as tolerated

## 2024-02-10 NOTE — Patient Instructions (Signed)
 Please continue taking Torsemide  20 mg once daily.  Please get blood tests done on 02/12/24 with other Hematology blood tests.  Please continue to follow low carb diet and ambulate as tolerated.

## 2024-02-10 NOTE — Assessment & Plan Note (Deleted)
 Had exertional dyspnea and fatigue, could be due to acute on chronic CHF On torsemide  20 mg QD now, he reports improved leg swelling despite taking torsemide  only once daily Daily weight checks Followed by cardiology Held Coreg  and lisinopril  recently On Jardiance 10 mg daily

## 2024-02-10 NOTE — Assessment & Plan Note (Addendum)
 Lab Results  Component Value Date   HGBA1C 8.3 (H) 11/11/2023   Uncontrolled, but improving Associated with diabetic retinopathy, HTN, CAD and HLD On Basaglar  38 U at bedtime, Farxiga  10 mg QD and Glimepiride  2 mg QD  Dced Metformin  and added Farxiga  10 mg once daily from recent hospitalization Does not want to start GLP1 agonist Advised to follow diabetic diet On ACEi F/u CMP and lipid panel Diabetic eye exam: Advised to follow up with Ophthalmology for diabetic eye exam

## 2024-02-10 NOTE — Progress Notes (Signed)
 Established Patient Office Visit  Subjective:  Patient ID: Lucas Dudley, male    DOB: 04-17-1941  Age: 83 y.o. MRN: [id number]  CC:  Chief Complaint  Patient presents with   Medical Management of Chronic Issues    Follow up    HPI Lucas Dudley is a 83 y.o. male with past medical history of HTN, CAD, HFrEF, A-fib s/p watchman device, type II DM with retinopathy, HLD, OSA, prostate ca. and morbid obesity who presents for f/u of recent hospitalization from 01/13/24-01/28/24.  He presented to the ED with worsening dyspnea, hypoxia and orthopnea.  He also had worsening leg swelling.  He was aggressively diuresed for acute decompensated HFpEF, after which he was down 20 pounds weight.  He had cardiac cath, which showed nonobstructive CAD.  He was given Jardiance and spironolactone  for HFpEF.  His hydralazine  was held due to better controlled blood pressure.  Metformin  was discontinued due to AKI on CKD.  HTN, CAD, A Fib and CHF: His BP was WNL today. He takes Torsemide  20 mg QD now.  Coreg  and lisinopril  have been discontinued during recent hospitalizations.  He has watchman device in place. Takes Aspirin  for h/o CAD. Followed by Cardiology. He reports improvement in chronic leg swelling and orthopnea.  Type 2 DM: He takes Basaglar  38 U qHS and Glimepiride  2 mg at bedtime. He has tried Ozempic in the past, but had supply issues. He denies trying Ozempic again. He checks blood glucose regularly in AM, which have been mostly around 120-150 with some blood glucose readings near 70s, but likely has hyperglycemia during the day. Denies any recent hypoglycemia. He had myalgias with statin, takes Gemfibrozil  currently. He is followed by Retina specialist for diabetic retinopathy.  He uses CPAP regularly for OSA.  Iron  deficiency anemia: He has history of GI bleeding due to Eliquis , leading to iron  deficiency anemia.  He has had iron  transfusions in the past.  Of note, his Hb has been stable at 12.0 now.   He is getting iron  transfusions at hematology clinic.  He denies melena or hematochezia currently.  He takes half tablet of iron  supplement as he has bloating and constipation with whole tablet.  He takes pantoprazole  40 mg QD for GERD.  He reports bloating and epigastric discomfort after eating.  He states that he has had pneumococcal vaccine - details unknown.  Past Medical History:  Diagnosis Date   Arthritis    Atrial fibrillation (HCC)    CAD (coronary artery disease)    a. Cath 03/17/15 showing 100% ostial D1, 50% prox LAD to mid LAD, 40% RPDA stenosis. Med rx. // Myoview  01/2020: EF 31 diffuse perfusion defect without reversibility (suspect artifact); reviewed with Dr. Lupe Salk study felt to be low risk   Cataract    Mixed form OD   Chronic diastolic CHF (congestive heart failure) (HCC)    Chronically elevated hemidiaphragm - Right Side    Diabetic retinopathy (HCC)    NPDR OU   Essential hypertension    Glaucoma    POAG OU   History of gout    Hyperlipidemia    Hypertensive retinopathy    OU   Kidney stones    Melanoma of neck (HCC)    NICM (nonischemic cardiomyopathy) (HCC)    OSA on CPAP 2012   Prostate cancer (HCC)    Type II diabetes mellitus (HCC)     Past Surgical History:  Procedure Laterality Date   ABDOMINAL HERNIA REPAIR  w/mesh   ATRIAL FIBRILLATION ABLATION N/A 06/06/2021   Procedure: ATRIAL FIBRILLATION ABLATION;  Surgeon: Jolly Needle, MD;  Location: [facility];  Service: Cardiovascular;  Laterality: N/A;   BIOPSY  05/24/2022   Procedure: BIOPSY;  Surgeon: Suzette Espy, MD;  Location: [facility];  Service: Endoscopy;;   BIOPSY  05/30/2022   Procedure: BIOPSY;  Surgeon: Suzette Espy, MD;  Location: [facility];  Service: Endoscopy;;   BIOPSY  01/10/2023   Procedure: BIOPSY;  Surgeon: Normie Becton., MD;  Location: [facility];  Service: Gastroenterology;;   CARDIAC CATHETERIZATION N/A 03/17/2015   Procedure: Left Heart  Cath and Coronary Angiography;  Surgeon: Avanell Leigh, MD;  Location: [facility] INVASIVE CV LAB;  Service: Cardiovascular;  Laterality: N/A;   CARDIOVERSION N/A 08/09/2021   Procedure: CARDIOVERSION;  Surgeon: Maudine Sos, MD;  Location: [facility] ENDOSCOPY;  Service: Cardiovascular;  Laterality: N/A;   CARDIOVERSION N/A 11/10/2021   Procedure: CARDIOVERSION;  Surgeon: Jerryl Morin, DO;  Location: [facility]OS[facility];  Service: Cardiovascular;  Laterality: N/A;   carotid doppler  09/17/2008   rigt and left ICAs 0-49%;mildly  abnormal   CATARACT EXTRACTION Left 2020   Dr. Reyne Cave   COLONOSCOPY N/A 05/16/2017   Procedure: COLONOSCOPY;  Surgeon: Ruby Corporal, MD;  Location: [facility];  Service: Endoscopy;  Laterality: N/A;  930   COLONOSCOPY WITH PROPOFOL  N/A 05/24/2022   Procedure: COLONOSCOPY WITH PROPOFOL ;  Surgeon: Suzette Espy, MD;  [facility]: [facility];  Service: Endoscopy;  Laterality: N/A;   DOPPLER ECHOCARDIOGRAPHY  05/25/2009   EF 50-55%,LA mildly dilated, LV function normal   ELECTROPHYSIOLOGIC STUDY N/A 04/05/2015   Procedure: Cardioversion;  Surgeon: Luana Rumple, MD;  Location: [facility];  Service: Cardiovascular;  Laterality: N/A;   ELECTROPHYSIOLOGIC STUDY N/A 09/06/2015   Procedure: Atrial Fibrillation Ablation;  Surgeon: Jolly Needle, MD;  Location: [facility] INVASIVE CV LAB;  Service: Cardiovascular;  Laterality: N/A;   ELECTROPHYSIOLOGIC STUDY N/A 07/12/2016   redo afib ablation by Dr Nunzio Belch   ENTEROSCOPY N/A 05/30/2022   Procedure: ENTEROSCOPY;  Surgeon: Suzette Espy, MD;  [facility];  Service: Endoscopy;  Laterality: N/A;   ENTEROSCOPY N/A 01/07/2024   Procedure: ENTEROSCOPY;  Surgeon: Urban Garden, MD;  [facility];  Service: Gastroenterology;  Laterality: N/A;  1:45PM;ASA 3   ESOPHAGOGASTRODUODENOSCOPY (EGD) WITH PROPOFOL  N/A 05/24/2022   Procedure: ESOPHAGOGASTRODUODENOSCOPY (EGD) WITH PROPOFOL ;  Surgeon: Suzette Espy, MD;   [facility];  Service: Endoscopy;  Laterality: N/A;   ESOPHAGOGASTRODUODENOSCOPY (EGD) WITH PROPOFOL  N/A 01/10/2023   Procedure: ESOPHAGOGASTRODUODENOSCOPY (EGD) WITH PROPOFOL ;  Surgeon: Brice Campi Albino Alu., MD;  Location: [facility] ENDOS[facility];  Service: Gastroenterology;  Laterality: N/A;   EUS N/A 01/10/2023   Procedure: UPPER ENDOSCOPIC ULTRASOUND (EUS) RADIAL;  Surgeon: Normie Becton., MD;  Location: [facility] ENDOSCOPY;  Service: Gastroenterology;  Laterality: N/A;   EXCISIONAL HEMORRHOIDECTOMY     "inside and out"   EYE SURGERY Left 2020   Cat Sx - Dr. Reyne Cave   FINE NEEDLE ASPIRATION Right    knee; "drew ~ 1 quart off"   GIVENS CAPSULE STUDY N/A 05/25/2022   Procedure: GIVENS CAPSULE STUDY;  Surgeon: Vinetta Greening, DO;  [facility]: [facility];  Service: Endoscopy;  Laterality: N/A;   GIVENS CAPSULE STUDY N/A 01/23/2023   Procedure: GIVENS CAPSULE STUDY;  Surgeon: Urban Garden, MD;  [facility];  Service: Gastroenterology;  Laterality: N/A;  8:30am  HERNIA REPAIR     HOT HEMOSTASIS N/A 01/10/2023   Procedure: HOT HEMOSTASIS (ARGON PLASMA COAGULATION/BICAP);  Surgeon: Normie Becton., MD;  Location: [facility];  Service: Gastroenterology;  Laterality: N/A;   HOT HEMOSTASIS  01/07/2024   Procedure: EGD, WITH ARGON PLASMA COAGULATION;  Surgeon: Urban Garden, MD;  [facility];  Service: Gastroenterology;;   LAPAROSCOPIC CHOLECYSTECTOMY     LEFT ATRIAL APPENDAGE OCCLUSION N/A 06/28/2022   Procedure: LEFT ATRIAL APPENDAGE OCCLUSION;  Surgeon: Arnoldo Lapping, MD;  Location: [facility] INVASIVE CV LAB;  Service: Cardiovascular;  Laterality: N/A;   MELANOMA EXCISION Right    "neck"   NM MYOCAR PERF WALL MOTION  02/21/2012   EF 61% ,EXERCISE 7 METS. exercise stopped due to wheezing and shortness of breathe   POLYPECTOMY  05/16/2017   Procedure: POLYPECTOMY;  Surgeon: Ruby Corporal, MD;  [facility];  Service:  Endoscopy;;  colon   POLYPECTOMY  05/24/2022   Procedure: POLYPECTOMY;  Surgeon: Suzette Espy, MD;  [facility];  Service: Endoscopy;;   POLYPECTOMY  01/10/2023   Procedure: POLYPECTOMY;  Surgeon: Brice Campi Albino Alu., MD;  Location: [facility];  Service: Gastroenterology;;   PROSTATECTOMY     RIGHT/LEFT HEART CATH AND CORONARY ANGIOGRAPHY N/A 01/27/2024   Procedure: RIGHT/LEFT HEART CATH AND CORONARY ANGIOGRAPHY;  Surgeon: Lauralee Poll, MD;  Location: [facility] INVASIVE CV LAB;  Service: Cardiovascular;  Laterality: N/A;   SHOULDER OPEN ROTATOR CUFF REPAIR Right X 2   SUBMUCOSAL TATTOO INJECTION  05/30/2022   Procedure: SUBMUCOSAL TATTOO INJECTION;  Surgeon: Suzette Espy, MD;  Location[facility];  Service: Endoscopy;;   SUBMUCOSAL TATTOO INJECTION  01/10/2023   Procedure: SUBMUCOSAL TATTOO INJECTION;  Surgeon: Normie Becton., MD;  Location: [facility];  Service: Gastroenterology;;   TEE WITHOUT CARDIOVERSION N/A 09/05/2015   Procedure: TRANSESOPHAGEAL ECHOCARDIOGRAM (TEE);  Surgeon: Jacqueline Matsu, MD;  Location: [facility];  Service: Cardiovascular;  Laterality: N/A;   TEE WITHOUT CARDIOVERSION N/A 06/28/2022   Procedure: TRANSESOPHAGEAL ECHOCARDIOGRAM (TEE);  Surgeon: Arnoldo Lapping, MD;  Location: [facility] INVASIVE CV LAB;  Service: Cardiovascular;  Laterality: N/A;    Family History  Problem Relation Age of Onset   Heart disease Mother    Lung cancer Mother    Heart attack Mother 57   Diabetes Father    Heart disease Father    Stroke Brother    Healthy Daughter    Colon cancer Maternal Aunt        46s    Social History   Socioeconomic History   Marital status: Widowed    Spouse name: Not on file   Number of children: 1   Years of education: Not on file   Highest education level: GED or equivalent  Occupational History   Occupation: Retired  Tobacco Use   Smoking status: Former    Current packs/day: 0.00    Average packs/day: 2.0 packs/day  for 27.0 years (54.0 ttl pk-yrs)    Types: Cigarettes    Start date: 9    Quit date: 1992    Years since quitting: 33.3    Passive exposure: Current   Smokeless tobacco: Never   Tobacco comments:    Former smoker 09/27/21  Vaping Use   Vaping status: Never Used  Substance and Sexual Activity   Alcohol use: No    Alcohol/week: 0.0 standard drinks of alcohol    Comment: "used to drink; stopped ~ 2008"   Drug use: No  Sexual activity: Not Currently  Other Topics Concern   Not on file  Social History Narrative   Lives in [place], [place] with wife.   Social Drivers of Corporate investment banker Strain: Low Risk  (02/10/2024)   Overall Financial Resource Strain (CARDIA)    Difficulty of Paying Living Expenses: Not very hard  Food Insecurity: No Food Insecurity (02/10/2024)   Hunger Vital Sign    Worried About Running Out of Food in the Last Year: Never true    Ran Out of Food in the Last Year: Never true  Transportation Needs: No Transportation Needs (02/10/2024)   PRAPARE - Administrator, Civil Service (Medical): No    Lack of Transportation (Non-Medical): No  Physical Activity: Inactive (02/10/2024)   Exercise Vital Sign    Days of Exercise per Week: 0 days    Minutes of Exercise per Session: 0 min  Stress: No Stress Concern Present (02/10/2024)   [employer] of Occupational Health - Occupational Stress Questionnaire    Feeling of Stress : Only a little  Social Connections: Moderately Integrated (02/10/2024)   Social Connection and Isolation Panel [NHANES]    Frequency of Communication with Friends and Family: More than three times a week    Frequency of Social Gatherings with Friends and Family: More than three times a week    Attends Religious Services: More than 4 times per year    Active Member of [employer] or Organizations: Yes    Attends Banker Meetings: More than 4 times per year    Marital Status: Widowed  Recent Concern: Social Connections  - Moderately Isolated (11/20/2023)   Social Connection and Isolation Panel [NHANES]    Frequency of Communication with Friends and Family: More than three times a week    Frequency of Social Gatherings with Friends and Family: Once a week    Attends Religious Services: More than 4 times per year    Active Member of [employer] or Organizations: No    Attends Banker Meetings: Never    Marital Status: Widowed  Intimate Partner Violence: Not At Risk (01/29/2024)   Humiliation, Afraid, Rape, and Kick questionnaire    Fear of Current or Ex-Partner: No    Emotionally Abused: No    Physically Abused: No    Sexually Abused: No    Outpatient Medications Prior to Visit  Medication Sig Dispense Refill   acetaminophen  (TYLENOL ) 650 MG CR tablet Take 650 mg by mouth every 8 (eight) hours as needed for pain. During the day time and Tylenol  PM at night     albuterol  (VENTOLIN  HFA) 108 (90 Base) MCG/ACT inhaler Inhale 2 puffs into the lungs every 6 (six) hours as needed for wheezing or shortness of breath.     ammonium lactate  (AMLACTIN DAILY) 12 % lotion Apply 1 Application topically as needed. 400 g 0   aspirin  81 MG chewable tablet Chew 81 mg by mouth daily.     camphor-menthol (ANTI-ITCH) lotion Apply 1 Application topically daily as needed for itching.     COMBIGAN  0.2-0.5 % ophthalmic solution Place 1 drop into both eyes 2 (two) times daily.     diphenhydramine -acetaminophen  (TYLENOL  PM) 25-500 MG TABS tablet Take 1 tablet by mouth at bedtime.     ELDERBERRY PO Take 2 tablets by mouth daily. Chewable     ferrous sulfate  325 (65 FE) MG tablet Take 1 tablet (325 mg total) by mouth daily with breakfast. 120 tablet 1  fluticasone  (FLONASE ) 50 MCG/ACT nasal spray SPRAY 2 SPRAYS INTO EACH NOSTRIL EVERY DAY (Patient taking differently: Place 2 sprays into both nostrils daily.) 48 mL 1   gemfibrozil  (LOPID ) 600 MG tablet Take 600 mg by mouth at bedtime.      glimepiride  (AMARYL ) 2 MG tablet Take 1  tablet (2 mg total) by mouth daily with breakfast. 30 tablet 0   Insulin  Glargine (BASAGLAR  KWIKPEN) 100 UNIT/ML Inject 45 Units into the skin at bedtime. (Patient taking differently: Inject 38 Units into the skin at bedtime.) 15 mL 3   magnesium  gluconate (MAGONATE) 500 MG tablet Take 500 mg by mouth daily.     mineral oil liquid Take 15 mLs by mouth daily as needed for moderate constipation.     Omega-3 Fatty Acids (FISH OIL EXTRA STRENGTH) 1200 MG CAPS Take 1 capsule by mouth daily.     OVER THE COUNTER MEDICATION Take 1 tablet by mouth daily. Beets Chew     pantoprazole  (PROTONIX ) 40 MG tablet TAKE 1 TABLET BY MOUTH EVERY DAY 90 tablet 1   torsemide  (DEMADEX ) 20 MG tablet Take 1 tablet (20 mg total) by mouth 2 (two) times daily. 60 tablet 1   vitamin E 45 MG (100 UNITS) capsule Take 100 Units by mouth daily.     zinc  gluconate 50 MG tablet Take 50 mg by mouth daily.     dapagliflozin  propanediol (FARXIGA ) 10 MG TABS tablet Take 1 tablet (10 mg total) by mouth daily. 30 tablet 0   No facility-administered medications prior to visit.    Allergies  Allergen Reactions   Azithromycin  Nausea Only   Tape Rash and Other (See Comments)    Causes skin redness, Use paper tape only.    ROS Review of Systems  Constitutional:  Positive for fatigue. Negative for chills and fever.  HENT:  Negative for congestion and sore throat.   Eyes:  Negative for pain and discharge.  Respiratory:  Positive for shortness of breath (Intermittent). Negative for cough.   Cardiovascular:  Positive for leg swelling. Negative for chest pain and palpitations.  Gastrointestinal:  Negative for diarrhea, nausea and vomiting.  Endocrine: Negative for polydipsia and polyuria.  Genitourinary:  Negative for dysuria and hematuria.  Musculoskeletal:  Negative for neck pain and neck stiffness.  Skin:  Negative for rash.  Neurological:  Negative for dizziness and weakness.  Psychiatric/Behavioral:  Negative for agitation and  behavioral problems.       Objective:    Physical Exam Vitals reviewed.  Constitutional:      General: He is not in acute distress.    Appearance: He is obese. He is not diaphoretic.  HENT:     Head: Normocephalic and atraumatic.     Nose: No congestion.     Mouth/Throat:     Mouth: Mucous membranes are moist.  Eyes:     General: No scleral icterus.    Extraocular Movements: Extraocular movements intact.  Cardiovascular:     Rate and Rhythm: Normal rate and regular rhythm.     Heart sounds: Normal heart sounds. No murmur heard. Pulmonary:     Breath sounds: Normal breath sounds. No wheezing or rales.  Abdominal:     Palpations: Abdomen is soft.     Tenderness: There is no abdominal tenderness.  Musculoskeletal:     Cervical back: Neck supple. No tenderness.     Right lower leg: Edema (Mild) present.     Left lower leg: Edema (Mild) present.  Skin:  General: Skin is warm.     Findings: Rash (Stasis dermatitis over bilateral legs) present.  Neurological:     General: No focal deficit present.     Mental Status: He is alert and oriented to person, place, and time.     Sensory: No sensory deficit.     Motor: No weakness.  Psychiatric:        Mood and Affect: Mood normal.        Behavior: Behavior normal.     BP 116/76   Pulse 92   Ht 5' 10.5" (1.791 m)   Wt 233 lb 9.6 oz (106 kg)   SpO2 96%   BMI 33.04 kg/m  Wt Readings from Last 3 Encounters:  02/10/24 233 lb 9.6 oz (106 kg)  02/04/24 233 lb (105.7 kg)  01/28/24 232 lb 5.8 oz (105.4 kg)    Lab Results  Component Value Date   TSH 3.037 05/21/2022   Lab Results  Component Value Date   WBC 7.8 02/12/2024   HGB 12.0 (L) 02/12/2024   HCT 39.8 02/12/2024   MCV 92.3 02/12/2024   PLT 398 02/12/2024   Lab Results  Component Value Date   NA 132 (L) 02/12/2024   K 5.0 02/12/2024   CO2 25 02/12/2024   GLUCOSE 258 (H) 02/12/2024   BUN 28 (H) 02/12/2024   CREATININE 1.57 (H) 02/12/2024   BILITOT 0.7  01/13/2024   ALKPHOS 52 01/13/2024   AST 28 01/13/2024   ALT 20 01/13/2024   PROT 7.5 01/13/2024   ALBUMIN 3.0 (L) 01/28/2024   CALCIUM  9.2 02/12/2024   ANIONGAP 13 02/12/2024   EGFR 40 (L) 12/30/2023   GFR 76.23 05/04/2015   Lab Results  Component Value Date   CHOL 133 01/24/2024   Lab Results  Component Value Date   HDL 26 (L) 01/24/2024   Lab Results  Component Value Date   LDLCALC 89 01/24/2024   Lab Results  Component Value Date   TRIG 91 01/24/2024   Lab Results  Component Value Date   CHOLHDL 5.1 01/24/2024   Lab Results  Component Value Date   HGBA1C 7.6 (H) 02/12/2024      Assessment & Plan:   Problem List Items Addressed This Visit       Cardiovascular and Mediastinum   Essential hypertension   BP Readings from Last 1 Encounters:  02/10/24 116/76   Well controlled with Torsemide  20 mg QD Held Hydralazine  for hypotension during recent hospitalization Held Coreg  due to bradycardia and lisinopril  40 mg QD due to recent worsening of kidney function BP wnl currently, no change in medications for now Counseled for compliance with the medications Advised DASH diet and moderate exercise/walking as tolerated      Relevant Orders   Basic Metabolic Panel (BMET)   Chronic combined systolic and diastolic CHF (congestive heart failure) (HCC)   Relevant Medications   dapagliflozin  propanediol (FARXIGA ) 10 MG TABS tablet   Acute on chronic heart failure with preserved ejection fraction (HCC) - Primary   Had exertional dyspnea and fatigue, could be due to acute on chronic CHF On torsemide  20 mg QD now, he reports improved leg swelling despite taking torsemide  only once daily Daily weight checks Followed by cardiology Held Coreg  and lisinopril  recently On Farxiga  10 mg daily        Endocrine   Type 2 diabetes mellitus with other specified complication (HCC)   Lab Results  Component Value Date   HGBA1C 8.3 (H) 11/11/2023  Uncontrolled, but  improving Associated with diabetic retinopathy, HTN, CAD and HLD On Basaglar  38 U at bedtime, Farxiga  10 mg QD and Glimepiride  2 mg QD  Dced Metformin  and added Farxiga  10 mg once daily from recent hospitalization Does not want to start GLP1 agonist Advised to follow diabetic diet On ACEi F/u CMP and lipid panel Diabetic eye exam: Advised to follow up with Ophthalmology for diabetic eye exam      Relevant Medications   dapagliflozin  propanediol (FARXIGA ) 10 MG TABS tablet   Other Relevant Orders   Basic Metabolic Panel (BMET)   Hemoglobin A1c     Other   Iron  deficiency anemia   Due to Eliquis  in the past Hb stable ~11 now Has had iron  transfusions Continue oral iron  supplement Followed by Hematology      Hospital discharge follow-up   Hospital chart reviewed, including discharge summary Medications reconciled and reviewed with the patient in detail         Meds ordered this encounter  Medications   dapagliflozin  propanediol (FARXIGA ) 10 MG TABS tablet    Sig: Take 1 tablet (10 mg total) by mouth daily.    Dispense:  90 tablet    Refill:  1    Follow-up: Return in about 3 months (around 05/11/2024) for DM and CHF.    Meldon Sport, MD

## 2024-02-11 ENCOUNTER — Ambulatory Visit: Attending: Nurse Practitioner

## 2024-02-11 DIAGNOSIS — R0602 Shortness of breath: Secondary | ICD-10-CM

## 2024-02-11 DIAGNOSIS — I272 Pulmonary hypertension, unspecified: Secondary | ICD-10-CM | POA: Diagnosis not present

## 2024-02-11 LAB — ECHOCARDIOGRAM LIMITED
Calc EF: 43.8 %
S' Lateral: 3.3 cm
Single Plane A2C EF: 46.8 %
Single Plane A4C EF: 41.6 %

## 2024-02-12 ENCOUNTER — Inpatient Hospital Stay (HOSPITAL_BASED_OUTPATIENT_CLINIC_OR_DEPARTMENT_OTHER): Admitting: Physician Assistant

## 2024-02-12 DIAGNOSIS — I10 Essential (primary) hypertension: Secondary | ICD-10-CM

## 2024-02-12 DIAGNOSIS — D509 Iron deficiency anemia, unspecified: Secondary | ICD-10-CM | POA: Diagnosis not present

## 2024-02-12 DIAGNOSIS — D500 Iron deficiency anemia secondary to blood loss (chronic): Secondary | ICD-10-CM

## 2024-02-12 DIAGNOSIS — Z794 Long term (current) use of insulin: Secondary | ICD-10-CM

## 2024-02-12 DIAGNOSIS — E1169 Type 2 diabetes mellitus with other specified complication: Secondary | ICD-10-CM

## 2024-02-12 LAB — BASIC METABOLIC PANEL WITH GFR
Anion gap: 13 (ref 5–15)
BUN: 28 mg/dL — ABNORMAL HIGH (ref 8–23)
CO2: 25 mmol/L (ref 22–32)
Calcium: 9.2 mg/dL (ref 8.9–10.3)
Chloride: 94 mmol/L — ABNORMAL LOW (ref 98–111)
Creatinine, Ser: 1.57 mg/dL — ABNORMAL HIGH (ref 0.61–1.24)
GFR, Estimated: 44 mL/min — ABNORMAL LOW (ref >60)
Glucose, Bld: 258 mg/dL — ABNORMAL HIGH (ref 70–99)
Potassium: 5 mmol/L (ref 3.5–5.1)
Sodium: 132 mmol/L — ABNORMAL LOW (ref 135–145)

## 2024-02-12 LAB — CBC
HCT: 39.8 % (ref 39.0–52.0)
Hemoglobin: 12 g/dL — ABNORMAL LOW (ref 13.0–17.0)
MCH: 27.8 pg (ref 26.0–34.0)
MCHC: 30.2 g/dL (ref 30.0–36.0)
MCV: 92.3 fL (ref 80.0–100.0)
Platelets: 398 10*3/uL (ref 150–400)
RBC: 4.31 MIL/uL (ref 4.22–5.81)
RDW: 19 % — ABNORMAL HIGH (ref 11.5–15.5)
WBC: 7.8 10*3/uL (ref 4.0–10.5)
nRBC: 0 % (ref 0.0–0.2)

## 2024-02-12 LAB — SAMPLE TO BLOOD BANK

## 2024-02-12 LAB — HEMOGLOBIN A1C
Hgb A1c MFr Bld: 7.6 % — ABNORMAL HIGH (ref 4.8–5.6)
Mean Plasma Glucose: 171.42 mg/dL

## 2024-02-13 DIAGNOSIS — Z09 Encounter for follow-up examination after completed treatment for conditions other than malignant neoplasm: Secondary | ICD-10-CM | POA: Insufficient documentation

## 2024-02-13 NOTE — Assessment & Plan Note (Addendum)
 Had exertional dyspnea and fatigue, could be due to acute on chronic CHF On torsemide  20 mg QD now, he reports improved leg swelling despite taking torsemide  only once daily Daily weight checks Followed by cardiology Held Coreg  and lisinopril  recently On Farxiga  10 mg daily

## 2024-02-13 NOTE — Assessment & Plan Note (Signed)
 Due to Eliquis in the past Hb stable ~11 now Has had iron transfusions Continue oral iron supplement Followed by Hematology

## 2024-02-13 NOTE — Assessment & Plan Note (Signed)
 Hospital chart reviewed, including discharge summary Medications reconciled and reviewed with the patient in detail

## 2024-02-13 NOTE — Addendum Note (Signed)
 Addended byCleola Dach on: 02/13/2024 02:51 PM   Modules accepted: Level of Service

## 2024-02-13 NOTE — Progress Notes (Signed)
 Erroneous encounter - please disregard.

## 2024-02-17 ENCOUNTER — Ambulatory Visit: Attending: Nurse Practitioner | Admitting: Nurse Practitioner

## 2024-02-17 ENCOUNTER — Ambulatory Visit: Admitting: Nurse Practitioner

## 2024-02-17 ENCOUNTER — Encounter: Payer: Self-pay | Admitting: Nurse Practitioner

## 2024-02-17 VITALS — BP 124/74 | HR 84 | Ht 70.0 in | Wt 236.0 lb

## 2024-02-17 DIAGNOSIS — I272 Pulmonary hypertension, unspecified: Secondary | ICD-10-CM

## 2024-02-17 DIAGNOSIS — G4733 Obstructive sleep apnea (adult) (pediatric): Secondary | ICD-10-CM

## 2024-02-17 DIAGNOSIS — N1832 Chronic kidney disease, stage 3b: Secondary | ICD-10-CM

## 2024-02-17 DIAGNOSIS — R29818 Other symptoms and signs involving the nervous system: Secondary | ICD-10-CM

## 2024-02-17 DIAGNOSIS — I4819 Other persistent atrial fibrillation: Secondary | ICD-10-CM | POA: Diagnosis not present

## 2024-02-17 DIAGNOSIS — I10 Essential (primary) hypertension: Secondary | ICD-10-CM

## 2024-02-17 DIAGNOSIS — I5032 Chronic diastolic (congestive) heart failure: Secondary | ICD-10-CM

## 2024-02-17 DIAGNOSIS — I2510 Atherosclerotic heart disease of native coronary artery without angina pectoris: Secondary | ICD-10-CM | POA: Diagnosis not present

## 2024-02-17 DIAGNOSIS — E785 Hyperlipidemia, unspecified: Secondary | ICD-10-CM

## 2024-02-17 NOTE — Progress Notes (Signed)
 Cardiology Office Note:  .   Date:  02/17/2024 ID:  Lucas Dudley, DOB [birth year], MRN [id number] PCP: Meldon Sport, MD  [facility] Providers Cardiologist:  Teddie Favre, MD Electrophysiologist:  Jolly Needle, MD (Inactive)    History of Present Illness: .   Lucas Dudley is a 83 y.o. male with a PMH of CAD, chronic CHF, persistent A-fib, s/p Watchman implantation in 2023, AVM, HTN, T2DM, mixed HLD, OSA, and former smoker, who presents today for cardiac catheterization follow-up.   Last seen by Dr. Londa Dudley on December 04, 2023.  At the time, patient noted gradually worsening DOE and fatigue over the last few months.  Was noted to have a 20 pound weight gain since last office visit in August 2024.  Carvedilol  was discontinued, Demadex  was increased to 40 mg twice daily for the next 3 to 4 days, then changed to standing dose of 40 mg in the morning and 20 mg in the afternoon.  Potassium supplement was started.  Echocardiogram was ordered to be updated.  Hospital stay in early March 2025 for acute hypoxic respiratory failure and acute on chronic CHF exacerbation.  CT scan was negative for acute PE.  Showed cardiomegaly with small pleural effusions, nonspecific mild mediastinal and right hilar adenopathy, scattered punctate pulmonary nodules measuring less than 5 mm.  Was recommended to consider noncontrast CT of the chest that could possibly consider in 12 months the patient was considered to be high risk.  TTE revealed EF 55 to 60%, moderate LVH, mildly reduced right ventricular systolic function, severely elevated PASP, estimated right ventricular systolic pressure at 62.6 mmHg.   Today presents for hospital follow-up.  He is also pending endoscopy later this month with GI. He states he had a recent fall at home after chair was slick and fell out of chair at home, neighbor came and helped him up. Denies any syncope, acute injuries, or head injuries. Says overall he is doing fairly well,  breathing better since leaving the hospital, but does admit to weakness, says he feels like he lost his upper body strength. Denies any chest pain, shortness of breath, palpitations, syncope, presyncope, dizziness, orthopnea, PND, swelling or significant weight changes, acute bleeding, or claudication.  Saw Dr. Londa Dudley after hospital follow-up. Set up for heart cath - see report.   Hospitalized 01/13/2024 -  01/28/2024 for acute hypoxic respiratory failure and decompensated CHF, pulmonary HTN. Felt to have likely undiagnosed COPD/OSA contributing to pulm HTN. Did well with diuresis. Started on Jardiance. Ujnderwent RHC/LHC 01/27/2024 that showed nonobstructive CAD- see full report below. IV iron  by hem/onc.   See by HF clinic on 02/04/2024 - volume status appeared low. Was cut back on his diuretic. Stopped Amio. EKG that day showed A-fib, 42 bpm. Getting iron  infusions with Hem/Onc. sCr elevated to 2.25, potassium 5.7. Was instructed to stop Aldactone  and potassium, hold Torsemide  and then restart Demedex at 20 mg BID and to recheck labs in 1 week. Repeat labs were much better.   Today he presents for follow-up. He states he is doing better than last time he was seen in office. Says he seems to have a little bit more energy. Tells me he is compliant with his CPAP, but needs new straps for his machine. Denies any chest pain, shortness of breath, palpitations, syncope, presyncope, dizziness, orthopnea, PND, swelling or significant weight changes, acute bleeding, or claudication.  ROS: Negative. See HPI.   Studies Reviewed: Aaron Aas    EKG: EKG is not  ordered today.   Echo limited 02/11/2024:   1. Limited study.   2. Left ventricular ejection fraction, by estimation, is 55 to 60%. The  left ventricle has normal function. The left ventricle has no regional  wall motion abnormalities. There is moderate concentric left ventricular  hypertrophy. Left ventricular diastolic parameters are indeterminate.   3. Right  ventricular systolic function is mildly reduced. The right  ventricular size is normal. There is moderately elevated pulmonary artery systolic pressure. The estimated right ventricular systolic pressure is 47.4 mmHg.   4. A small pericardial effusion is present. The pericardial effusion is  posterior to the left ventricle.   5. Mild mitral valve regurgitation.   6. The aortic valve is tricuspid. There is mild calcification of the  aortic valve.   7. The inferior vena cava is normal in size with <50% respiratory  variability, suggesting right atrial pressure of 8 mmHg.   Comparison(s): A prior study was performed on 12/15/2023. Prior images  reviewed side by side. LVEF normal range at 55-60%. Mild RV dysfunction. Estimated RVSP of 47.4 mmHg has decreased in comparison.  Right/left heart cath 01/27/2024:    Prox RCA to Mid RCA lesion is 40% stenosed.   RPDA lesion is 60% stenosed.   HEMODYNAMICS: RA:       12 mmHg (mean) RV:       61/8, 12 mmHg PA:       63/17 mmHg (32 mean) PCWP: 16 mmHg (mean) with v waves to 26                                      Estimated Fick CO/CI   5.78L/min, 2.59L/min/m2                                            TPG  16  mmHg                                               PVR  2.7 Wood Units  PAPi  3.83       IMPRESSION: Moderate, nonobstructive coronary artery disease with mild progression from 2016. 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 Well compensated RV function. Normal cardiac output/index   RECOMMENDATIONS: Results conveyed to ordering cardiologist. Continued diuresis and evaluated for group III PH given extensive smoking history and OSA  Echo 12/2023:  1. Left ventricular ejection fraction, by estimation, is 55 to 60%. Left  ventricular ejection fraction by PLAX is 59 %. The left ventricle  has  normal function. The left ventricle has no regional wall motion  abnormalities. There is moderate concentric  left ventricular hypertrophy. Left ventricular diastolic parameters are  indeterminate.   2. Right ventricular systolic function is mildly reduced. The right  ventricular size is normal. There is severely elevated pulmonary artery  systolic pressure. The estimated right ventricular systolic pressure is  62.6 mmHg.   3. The mitral valve is grossly normal. No evidence of mitral valve  regurgitation.   4. The aortic valve is tricuspid. Aortic valve regurgitation  is not  visualized. Aortic valve sclerosis/calcification is present, without any  evidence of aortic stenosis.   5. The inferior vena cava is dilated in size with <50% respiratory  variability, suggesting right atrial pressure of 15 mmHg.   Comparison(s): Changes from prior study are noted. 06/06/2022: LVEF 60-65%, mild LVH, low normal RV function.   CT cardiac 08/2022: IMPRESSION: 1.  Mild bi atrial enlargement   2.  Trans septal puncture site sealed with no shunt   3. 31 mm Watchman FLX with 1/3 medial shoulder and 2.2 mm medial gap. Device is well endothelialized at surface. Average compression 15% Distal 1/3 of device and LAA not thrombosed and accepts contrast   4.  No pericardial effusion   5.  Normal ascending thoracic aorta 3.4 cm   6.  Normal PV anatomy  IMPRESSION: 1. Mildly prominent subcarinal lymph node, 1.6 cm in short axis, previously 2.1 cm on 05/31/2021. This is nonspecific but could be reactive. 2. Mild peripheral subpleural reticulation in the lungs, cannot exclude mild fibrosis. 3. Healing subacute right lateral rib fractures. Old left lateral lower rib fractures. 4. Scattered mild scarring or atelectasis and peripheral subpleural reticulation, unchanged from prior, cannot exclude mild fibrosis. 5. Descending thoracic aortic atherosclerotic vascular disease. 6. Mild peripheral  scarring or atelectasis in both lungs. 7. Mild mediastinal lipomatosis. This is a benign incidental finding. 8. Aortic atherosclerosis.   Aortic Atherosclerosis (ICD10-I70.0).  Lexiscan  01/2020:  Nuclear stress EF: 31%. There was no ST segment deviation noted during stress. Findings consistent with prior myocardial infarction. This is a high risk study. The left ventricular ejection fraction is moderately decreased (30-44%).   Low exercise tolerance (3 METs), unable to reach target heart rate, so study converted to pharmacologic. Baseline blood pressure very elevated at baseline (198/96). There are diffuse perfusion defects at rest and with stress, sparing only the mid-lateral wall. There is no significant reversibility on this study. Based on distribution, would be concerned that this is artifact. However, with low EF, suggests diffuse areas of poor perfusion without significant reversibility.   LHC 03/2015:  IMPRESSION:Mr. Harl has noncritical CAD with severe LV dysfunction. I think that his inferior perfusion abnormality was artifactual. I do not think his moderate proximal LAD lesion is hemostatically significant. 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 The patient received 5000 units of heparin  intravenously. He received radial cocktail. The SideArm sheath. A total of 55 mL of contrast was used for the case. The sheath was removed and a TR band was placed on the right wrist to achieve patent hemostasis. The patient left the lab in stable condition. He'll be discharged home in 2 hours and will follow-up with me in the office in several weeks.  Cardiac monitor 02/2015: Paroxysmal atrial fib   The patient was in atrial fib for a significant amount of the monitored time. Many days he had atrial fib for almost the entire day.   He had several pauses of > 2 seconds.  No prolonged pauses that would cause syncope  Physical Exam:   VS:  BP 124/74    Pulse 84   Ht 5\' 10"  (1.778 m)   Wt 236 lb (107 kg)   SpO2 95%   BMI 33.86 kg/m    Wt Readings from Last 3 Encounters:  02/17/24 236 lb (107 kg)  02/10/24 233 lb 9.6 oz (106 kg)  02/04/24 233 lb (105.7 kg)    GEN: Obese, 83 y.o. male in  no acute distress NECK: No JVD; No carotid bruits CARDIAC: S1/S2, RRR, no murmurs, rubs, gallops RESPIRATORY:  Clear to auscultation without rales, wheezing or rhonchi  ABDOMEN: Soft, non-tender, non-distended EXTREMITIES:  No edema; No deformity   ASSESSMENT AND PLAN: .    1. HFpEF, pulmonary hypertension Stage C, NYHA class I-II symptoms. EF 55-60% 01/2024. Recent Echo revealed moderately elevated pulmonary pressures, etiology most likely d/t group II/III. Recent right and left heart cath noted above. Recommended to continue diuresis and to evaluate for group 3 PH given his OSA and extensive smoking hx. Euvolemic and well compensated on exam.  Continue torsemide  and Farxiga . Low sodium diet, fluid restriction <2L, and daily weights encouraged. Educated to contact our office for weight gain of 2 lbs overnight or 5 lbs in one week.   3. CAD Hx of occluded first diagonal and moderate LAD/PDA dx, has been medically managed.  Most recent heart cath in April 2025 revealed moderate, nonobstructive CAD with mild progression from 2016.  No intervention performed.  Stable with no anginal symptoms. No medication changes at this time. Heart healthy diet and regular cardiovascular exercise encouraged.   4. Persistent A-fib, s/p Watchman implantation in 2023 Denies any tachycardia or palpitations. HR is well controlled without AV nodal blockers. Doing well s/p Watchman implantation in 2023. Continue ASA, not on OAC.  Heart healthy diet and regular cardiovascular exercise encouraged. Continue to follow-up with EP.   5. HTN BP well controlled. Discussed to monitor BP at home at least 2 hours after medications and sitting for 5-10 minutes. No medication changes at  this time. Heart healthy diet and regular cardiovascular exercise encouraged.   6. HLD LDL 83 01/2024. Continue current medication regimen. Heart healthy diet and regular cardiovascular exercise encouraged.   7. OSA on CPAP Recent right and left heart cath noted above. Recommended to continue diuresis and to evaluate for group 3 PH given his OSA and extensive smoking hx. Encouraged continued compliance with CPAP, admits to issues with strap of mask. Last sleep study 5 years ago per his report. Will place referral to Dr. Micael Adas for further evaluation and management of his OSA.    8. CKD stage 3b Most recent labs show overall stable kidney function.  Avoid nephrotoxic agents.  No medication changes at this time.  Continue follow-up with PCP.  Dispo: Follow-up with me/APP in 2-3 months or sooner if anything changes.   Signed, Lasalle Pointer, NP

## 2024-02-17 NOTE — Patient Instructions (Addendum)
 Medication Instructions:  Your physician recommends that you continue on your current medications as directed. Please refer to the Current Medication list given to you today.  Labwork: None   Testing/Procedures: None   Follow-Up: Your physician recommends that you schedule a follow-up appointment in: 3 months HEART FAILURE INSTRUCTION SHEET  Follow a low-salt diet-you are allowed no more than 2,000 mg of sodium per day. Watch your fluid intake. In general, you should not be taking more than 64 ounces a day (no more than 8 glasses per day). Sometimes we refer to this as "2 liters per day." This includes sources of water  in food like soup, coffee, tea, milk etc. Weigh yourself on the same scale at the same time of the day preferably immediately after your first void. Keep a log of your weights. Call your doctor: (Anytime you feel any of the following symptoms)  3 lbs weight gain overnight or 5 lbs within a week Shortness of breath, with or without a day hacking cough Swelling in hands, feet or stomach If you have to sleep on extra pillows at night in order to breathe   IT IS IMPORTANT TO LET YOUR DOCTOR KNOW EARLY ON IF YOU ARE HAVING SYMPTOMS SO WE CAN HELP YOU!     Any Other Special Instructions Will Be Listed Below (If Applicable).  If you need a refill on your cardiac medications before your next appointment, please call your pharmacy.

## 2024-02-19 ENCOUNTER — Encounter: Payer: Self-pay | Admitting: Podiatry

## 2024-02-19 ENCOUNTER — Ambulatory Visit: Payer: Medicare Other | Admitting: Podiatry

## 2024-02-19 DIAGNOSIS — Z794 Long term (current) use of insulin: Secondary | ICD-10-CM | POA: Diagnosis not present

## 2024-02-19 DIAGNOSIS — B351 Tinea unguium: Secondary | ICD-10-CM | POA: Diagnosis not present

## 2024-02-19 DIAGNOSIS — M79675 Pain in left toe(s): Secondary | ICD-10-CM

## 2024-02-19 DIAGNOSIS — E1169 Type 2 diabetes mellitus with other specified complication: Secondary | ICD-10-CM

## 2024-02-19 DIAGNOSIS — M79674 Pain in right toe(s): Secondary | ICD-10-CM

## 2024-02-19 NOTE — Progress Notes (Signed)
 This patient returns to my office for at risk foot care.  This patient requires this care by a professional since this patient will be at risk due to having type 2 diabetes, CKD and coagulation defect.  This patient is unable to cut nails himself since the patient cannot reach his nails.These nails are painful walking and wearing shoes.  This patient presents for at risk foot care today.  General Appearance  Alert, conversant and in no acute stress.  Vascular  Dorsalis pedis and posterior tibial  pulses are palpable  bilaterally.  Capillary return is within normal limits  bilaterally. Temperature is within normal limits  bilaterally.  Neurologic  Senn-Weinstein monofilament wire test within normal limits  bilaterally. Muscle power within normal limits bilaterally.  Nails Thick disfigured discolored nails with subungual debris  from hallux to fifth toes bilaterally. No evidence of bacterial infection or drainage bilaterally.  Orthopedic  No limitations of motion  feet .  No crepitus or effusions noted.  No bony pathology or digital deformities noted.  Skin  normotropic skin with no porokeratosis noted bilaterally.  No signs of infections or ulcers noted.     Onychomycosis  Pain in right toes  Pain in left toes  Consent was obtained for treatment procedures.   Mechanical debridement of nails 1-5  bilaterally performed with a nail nipper.  Filed with dremel without incident.    Return office visit    3 months                  Told patient to return for periodic foot care and evaluation due to potential at risk complications.   Ruffin Cotton DPM

## 2024-02-27 ENCOUNTER — Ambulatory Visit (INDEPENDENT_AMBULATORY_CARE_PROVIDER_SITE_OTHER): Payer: Medicare Other | Admitting: Gastroenterology

## 2024-02-27 ENCOUNTER — Encounter (INDEPENDENT_AMBULATORY_CARE_PROVIDER_SITE_OTHER): Payer: Self-pay | Admitting: Gastroenterology

## 2024-02-27 VITALS — BP 138/65 | HR 60 | Temp 97.5°F | Ht 70.5 in | Wt 235.4 lb

## 2024-02-27 DIAGNOSIS — D509 Iron deficiency anemia, unspecified: Secondary | ICD-10-CM | POA: Diagnosis not present

## 2024-02-27 DIAGNOSIS — D500 Iron deficiency anemia secondary to blood loss (chronic): Secondary | ICD-10-CM

## 2024-02-27 DIAGNOSIS — K5520 Angiodysplasia of colon without hemorrhage: Secondary | ICD-10-CM | POA: Diagnosis not present

## 2024-02-27 NOTE — Patient Instructions (Signed)
-  continue iron  pill daily -continue to follow with hematology for any further iron  infusions -will continue octreotide  injection every 4 weeks to prevent further GI bleeding from AVMs -continue protonix  40mg  daily  Follow up 6 months or sooner if you have new or worsening GI issues  It was a pleasure to see you today. I want to create trusting relationships with patients and provide genuine, compassionate, and quality care. I truly value your feedback! please be on the lookout for a survey regarding your visit with me today. I appreciate your input about our visit and your time in completing this!    Daud Cayer L. Angelyn Osterberg, MSN, APRN, AGNP-C Adult-Gerontology Nurse Practitioner [facility] at [facility]

## 2024-02-27 NOTE — Progress Notes (Addendum)
 Referring Provider: Meldon Sport, MD Primary Care Physician:  Meldon Sport, MD Primary GI Physician: Dr. Sammi Crick   Chief Complaint  Patient presents with   Follow-up    Patient here today for a follow up on Anemia, due to chronic blood loss. Patient says he stays cold, has fatigue, shortness of breath and dizziness, which he says is better than it had been in the past. Hgb on 02/12/2024 was 12.0. He says he takes po fe daily.   HPI:   Lucas Dudley is a 83 y.o. male with past medical history of atrial fibrillation s/p watchman procedure now on chronic ASA, systolic CHF, hypertension, diabetes   Patient presenting today for:  Follow up of IDA, recurrent AVMs   Last seen February, at that time seeing hematology with upcoming feraheme x3 infusions, feeling more fatigued, weak for the past onth. Maintained on iron  pills. No BRBPR, stools darker on iron . On ASA since watchman procedure. Constipation controlled with daily softener   Recommended enteroscopy, Consider CTE pending Push enteroscopy findings, continue to follow with hematology, continue iron  pills, continue PPI daily, consider initiation of octreotide  if continued presence of AVMs.  Enteroscopy in march with AVMs of duoenum, started on octreotide  20mg  Q4w  Last hgb 12 on 02/02/24  Ferritin 77, iron  69, TIBC 371 sat 19 on 01/06/24  Present:  States he was admitted for COPD/pulmonary htn in early April, hgb dropped to 8 at that time. Had cardiac cath done as well without blockages.  Had iron  infusion on 4/17. Has another upcoming iron  infusion. Currently on Iron  pill daily, as well as B12   He had first dose of octreotide  on 4/28. He notes some diarrhea for about 2 days thereafter. He did not take anything for this as it resolved.   Denies rectal bleeding or melena. Rare abdominal pain, usually improves with defecation. Weight is stable. Appetite is so so, states he does not get hungry very often. No nausea or vomiting.  Has some SOB at times which is chronic, but denies dizziness, syncope.  Overall has no GI complaints    01/07/24: enteroscopy - Normal esophagus.                           - Normal stomach.                           - Normal examined duodenum.                           - A tattoo was seen in the jejunum. The tattoo site                            appeared normal.                           - Two non-bleeding angiodysplastic lesions in the                            duodenum. Treated with argon plasma coagulation                            (APC).                           -  No specimens collected. 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 Z-line irregular, 41 cm from the incisors. - 1 cm hiatal hernia.  - Congested and nodular mucosa in the antrum. Biopsied.- A single duodenal polyp in the bulb.  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 Treated with APC  - 2 tattoos were seen in the proximal jejunum. - Within the 2 tattoos, a subepithelial lesion was  noted. 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 Treated with APC - Normal mucosa was found in the rest of the visualized proximal jejunum. Tattooed the distal  extent of today's SBE.  EUS impression: - An intramural (subepithelial) lesion was found. The lesion appeared to originate from within the  deep mucosa (Layer 2). Tissue has not been obtained. However, the endosonographic appearance  could be concerning for a duplication cyst versus  leiomyoma versus GIST.   Path: dudoenal mucosa with prominent brunners gland and focal foveolar metaplasia-chronic peptic duodenitis  STOMACH, ANTRUM, BIOPSY:  -  Predominantly antral type mucosa with features of both  chemical/reactive gastropathy with focal erosion and moderate chronic  focally active gastritis.  -  An immunohistochemical stain for Helicobacter pylori organisms is   negative.  - An immunohistochemical stain (CK AE1/AE3 is performed and highlights  surface epithelium.    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 Status post ablation. Small bowel nodule -junction distal duodenum/jejunum. Irregular with a submucosal component. Suspicious in appearance. 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  No active bleeding.  Capsule did not reach cecum. Last Colonoscopy:05/24/22- One 5 mm polyp in the sigmoid colon-hyperplastic - Diverticulosis in the descending colon and at the splenic flexure. Redundant and elongated colon. - The examination was otherwise normal on direct and retroflexion views. No blood or clot in the lower GI tract. Given negative evaluation today, small bowel evaluation warranted. Last EGD 05/24/22- Normal esophagus. - Small hiatal hernia. Friable gastric mucosa. Couple of tiny gastric erosions the significance of which is uncertain. Status post gastric biopsy - Normal duodenal bulb, second portion of the duodenum and third portion of the duodenum. Filed Weights   02/27/24 1047  Weight: 235 lb 6.4 oz (106.8 kg)     Past Medical History:  Diagnosis Date   Arthritis    Atrial fibrillation (HCC)    CAD (coronary artery disease)    a. Cath 03/17/15 showing 100% ostial D1, 50% prox LAD to mid LAD, 40% RPDA stenosis. Med rx. // Myoview  01/2020: EF 31 diffuse perfusion defect without reversibility (suspect artifact); reviewed with Dr. Lupe Salk study felt to be low risk   Cataract    Mixed form OD   Chronic diastolic CHF (congestive heart failure) (HCC)    Chronically elevated hemidiaphragm - Right Side    Diabetic retinopathy (HCC)    NPDR OU   Essential hypertension    Glaucoma    POAG OU   History of gout    Hyperlipidemia    Hypertensive retinopathy    OU   Kidney stones    Melanoma of  neck (HCC)    NICM (nonischemic cardiomyopathy) (HCC)    OSA on CPAP 2012   Prostate cancer (HCC)    Type II diabetes mellitus (HCC)     Past Surgical History:  Procedure Laterality Date   ABDOMINAL HERNIA  REPAIR     w/mesh   ATRIAL FIBRILLATION ABLATION N/A 06/06/2021   Procedure: ATRIAL FIBRILLATION ABLATION;  Surgeon: Jolly Needle, MD;  Location: [facility];  Service: Cardiovascular;  Laterality: N/A;   BIOPSY  05/24/2022   Procedure: BIOPSY;  Surgeon: Suzette Espy, MD;  Location: [facility];  Service: Endoscopy;;   BIOPSY  05/30/2022   Procedure: BIOPSY;  Surgeon: Suzette Espy, MD;  [facility]: [facility];  Service: Endoscopy;;   BIOPSY  01/10/2023   Procedure: BIOPSY;  Surgeon: Normie Becton., MD;  Location: [facility];  Service: Gastroenterology;;   CARDIAC CATHETERIZATION N/A 03/17/2015   Procedure: Left Heart Cath and Coronary Angiography;  Surgeon: Avanell Leigh, MD;  Location: [facility] INVASIVE CV LAB;  Service: Cardiovascular;  Laterality: N/A;   CARDIOVERSION N/A 08/09/2021   Procedure: CARDIOVERSION;  Surgeon: Maudine Sos, MD;  Location: [facility] ENDOSCOPY;  Service: Cardiovascular;  Laterality: N/A;   CARDIOVERSION N/A 11/10/2021   Procedure: CARDIOVERSION;  Surgeon: Jerryl Morin, DO;  Location: [facility];  Service: Cardiovascular;  Laterality: N/A;   carotid doppler  09/17/2008   rigt and left ICAs 0-49%;mildly  abnormal   CATARACT EXTRACTION Left 2020   Dr. Reyne Cave   COLONOSCOPY N/A 05/16/2017   Procedure: COLONOSCOPY;  Surgeon: Ruby Corporal, MD;  [facility]: [facility];  Service: Endoscopy;  Laterality: N/A;  930   COLONOSCOPY WITH PROPOFOL  N/A 05/24/2022   Procedure: COLONOSCOPY WITH PROPOFOL ;  Surgeon: Suzette Espy, MD;  [facility];  Service: Endoscopy;  Laterality: N/A;   DOPPLER ECHOCARDIOGRAPHY  05/25/2009   EF 50-55%,LA mildly dilated, LV function normal   ELECTROPHYSIOLOGIC STUDY N/A 04/05/2015   Procedure:  Cardioversion;  Surgeon: Luana Rumple, MD;  Location: [facility];  Service: Cardiovascular;  Laterality: N/A;   ELECTROPHYSIOLOGIC STUDY N/A 09/06/2015   Procedure: Atrial Fibrillation Ablation;  Surgeon: Jolly Needle, MD;  Location: [facility] INVASIVE CV LAB;  Service: Cardiovascular;  Laterality: N/A;   ELECTROPHYSIOLOGIC STUDY N/A 07/12/2016   redo afib ablation by Dr Nunzio Belch   ENTEROSCOPY N/A 05/30/2022   Procedure: ENTEROSCOPY;  Surgeon: Suzette Espy, MD;  [facility];  Service: Endoscopy;  Laterality: N/A;   ENTEROSCOPY N/A 01/07/2024   Procedure: ENTEROSCOPY;  Surgeon: Urban Garden, MD;  [facility];  Service: Gastroenterology;  Laterality: N/A;  1:45PM;ASA 3   ESOPHAGOGASTRODUODENOSCOPY (EGD) WITH PROPOFOL  N/A 05/24/2022   Procedure: ESOPHAGOGASTRODUODENOSCOPY (EGD) WITH PROPOFOL ;  Surgeon: Suzette Espy, MD;  [facility];  Service: Endoscopy;  Laterality: N/A;   ESOPHAGOGASTRODUODENOSCOPY (EGD) WITH PROPOFOL  N/A 01/10/2023   Procedure: ESOPHAGOGASTRODUODENOSCOPY (EGD) WITH PROPOFOL ;  Surgeon: Brice Campi Albino Alu., MD;  Location: [facility];  Service: Gastroenterology;  Laterality: N/A;   EUS N/A 01/10/2023   Procedure: UPPER ENDOSCOPIC ULTRASOUND (EUS) RADIAL;  Surgeon: Normie Becton., MD;  Location: [facility] ENDOSCOPY;  Service: Gastroenterology;  Laterality: N/A;   EXCISIONAL HEMORRHOIDECTOMY     "inside and out"   EYE SURGERY Left 2020   Cat Sx - Dr. Reyne Cave   FINE NEEDLE ASPIRATION Right    knee; "drew ~ 1 quart off"   GIVENS CAPSULE STUDY N/A 05/25/2022   Procedure: GIVENS CAPSULE STUDY;  Surgeon: Vinetta Greening, DO;  [facility];  Service: Endoscopy;  Laterality: N/A;   GIVENS CAPSULE STUDY N/A 01/23/2023   Procedure: GIVENS CAPSULE STUDY;  Surgeon: Urban Garden, MD;  [facility];  Service: Gastroenterology;  Laterality: N/A;  8:30am   HERNIA REPAIR     HOT HEMOSTASIS N/A 01/10/2023    Procedure: HOT HEMOSTASIS (ARGON PLASMA COAGULATION/BICAP);  Surgeon: Normie Becton., MD;  Location: [facility];  Service: Gastroenterology;  Laterality: N/A;   HOT HEMOSTASIS  01/07/2024   Procedure: EGD, WITH ARGON PLASMA COAGULATION;  Surgeon: Urban Garden, MD;  [facility];  Service: Gastroenterology;;   LAPAROSCOPIC CHOLECYSTECTOMY     LEFT ATRIAL APPENDAGE OCCLUSION N/A 06/28/2022   Procedure: LEFT ATRIAL APPENDAGE OCCLUSION;  Surgeon: Arnoldo Lapping, MD;  Location: [facility] INVASIVE CV LAB;  Service: Cardiovascular;  Laterality: N/A;   MELANOMA EXCISION Right    "neck"   NM MYOCAR PERF WALL MOTION  02/21/2012   EF 61% ,EXERCISE 7 METS. exercise stopped due to wheezing and shortness of breathe   POLYPECTOMY  05/16/2017   Procedure: POLYPECTOMY;  Surgeon: Ruby Corporal, MD;  [facility];  Service: Endoscopy;;  colon   POLYPECTOMY  05/24/2022   Procedure: POLYPECTOMY;  Surgeon: Suzette Espy, MD;  [facility];  Service: Endoscopy;;   POLYPECTOMY  01/10/2023   Procedure: POLYPECTOMY;  Surgeon: Brice Campi Albino Alu., MD;  Location: [facility];  Service: Gastroenterology;;   PROSTATECTOMY     RIGHT/LEFT HEART CATH AND CORONARY ANGIOGRAPHY N/A 01/27/2024   Procedure: RIGHT/LEFT HEART CATH AND CORONARY ANGIOGRAPHY;  Surgeon: Lauralee Poll, MD;  Location: [facility] INVASIVE CV LAB;  Service: Cardiovascular;  Laterality: N/A;   SHOULDER OPEN ROTATOR CUFF REPAIR Right X 2   SUBMUCOSAL TATTOO INJECTION  05/30/2022   Procedure: SUBMUCOSAL TATTOO INJECTION;  Surgeon: Suzette Espy, MD;  [facility];  Service: Endoscopy;;   SUBMUCOSAL TATTOO INJECTION  01/10/2023   Procedure: SUBMUCOSAL TATTOO INJECTION;  Surgeon: Normie Becton., MD;  Location: [facility];  Service: Gastroenterology;;   TEE WITHOUT CARDIOVERSION N/A 09/05/2015   Procedure: TRANSESOPHAGEAL ECHOCARDIOGRAM (TEE);  Surgeon: Jacqueline Matsu, MD;  Location: [facility]  ENDOSCOPY;  Service: Cardiovascular;  Laterality: N/A;   TEE WITHOUT CARDIOVERSION N/A 06/28/2022   Procedure: TRANSESOPHAGEAL ECHOCARDIOGRAM (TEE);  Surgeon: Arnoldo Lapping, MD;  Location: [facility] INVASIVE CV LAB;  Service: Cardiovascular;  Laterality: N/A;    Current Outpatient Medications  Medication Sig Dispense Refill   albuterol  (VENTOLIN  HFA) 108 (90 Base) MCG/ACT inhaler Inhale 2 puffs into the lungs every 6 (six) hours as needed for wheezing or shortness of breath.     ammonium lactate  (AMLACTIN DAILY) 12 % lotion Apply 1 Application topically as needed. 400 g 0   aspirin  81 MG chewable tablet Chew 81 mg by mouth daily.     COMBIGAN  0.2-0.5 % ophthalmic solution Place 1 drop into both eyes 2 (two) times daily.     dapagliflozin  propanediol (FARXIGA ) 10 MG TABS tablet Take 1 tablet (10 mg total) by mouth daily. 90 tablet 1   diphenhydramine -acetaminophen  (TYLENOL  PM) 25-500 MG TABS tablet Take 1 tablet by mouth at bedtime.     ELDERBERRY PO Take 2 tablets by mouth daily. Chewable     ferrous sulfate  325 (65 FE) MG tablet Take 1 tablet (325 mg total) by mouth daily with breakfast. 120 tablet 1   fluticasone  (FLONASE ) 50 MCG/ACT nasal spray SPRAY 2 SPRAYS INTO EACH NOSTRIL EVERY DAY (Patient taking differently: Place 2 sprays into both nostrils daily.) 48 mL 1   gemfibrozil  (LOPID ) 600 MG tablet Take 600 mg by mouth at bedtime.      glimepiride  (AMARYL ) 2 MG tablet Take 1 tablet (2 mg total)  by mouth daily with breakfast. (Patient taking differently: Take 2 mg by mouth at bedtime.) 30 tablet 0   Insulin  Glargine (BASAGLAR  KWIKPEN) 100 UNIT/ML Inject 45 Units into the skin at bedtime. (Patient taking differently: Inject 32 Units into the skin at bedtime.) 15 mL 3   magnesium  gluconate (MAGONATE) 500 MG tablet Take 500 mg by mouth daily.     mineral oil liquid Take 15 mLs by mouth daily as needed for moderate constipation.     Omega-3 Fatty Acids (FISH OIL EXTRA STRENGTH) 1200 MG CAPS Take 1  capsule by mouth daily.     OVER THE COUNTER MEDICATION Take 1 tablet by mouth daily. Beets Chew     pantoprazole  (PROTONIX ) 40 MG tablet TAKE 1 TABLET BY MOUTH EVERY DAY 90 tablet 1   torsemide  (DEMADEX ) 20 MG tablet Take 1 tablet (20 mg total) by mouth 2 (two) times daily. 60 tablet 1   vitamin E 45 MG (100 UNITS) capsule Take 100 Units by mouth daily.     zinc  gluconate 50 MG tablet Take 50 mg by mouth daily.     camphor-menthol (ANTI-ITCH) lotion Apply 1 Application topically daily as needed for itching. (Patient not taking: Reported on 02/27/2024)     No current facility-administered medications for this visit.    Allergies as of 02/27/2024 - Review Complete 02/27/2024  Allergen Reaction Noted   Azithromycin  Nausea Only 11/10/2021   Tape Rash and Other (See Comments) 01/28/2012    Social History   Socioeconomic History   Marital status: Widowed    Spouse name: Not on file   Number of children: 1   Years of education: Not on file   Highest education level: GED or equivalent  Occupational History   Occupation: Retired  Tobacco Use   Smoking status: Former    Current packs/day: 0.00    Average packs/day: 2.0 packs/day for 27.0 years (54.0 ttl pk-yrs)    Types: Cigarettes    Start date: 4    Quit date: 1992    Years since quitting: 33.3    Passive exposure: Current   Smokeless tobacco: Never   Tobacco comments:    Former smoker 09/27/21  Vaping Use   Vaping status: Never Used  Substance and Sexual Activity   Alcohol use: No    Alcohol/week: 0.0 standard drinks of alcohol    Comment: "used to drink; stopped ~ 2008"   Drug use: No   Sexual activity: Not Currently  Other Topics Concern   Not on file  Social History Narrative   Lives in [place], [place] with wife.   Social Drivers of Corporate investment banker Strain: Low Risk  (02/10/2024)   Overall Financial Resource Strain (CARDIA)    Difficulty of Paying Living Expenses: Not very hard  Food Insecurity: No Food  Insecurity (02/10/2024)   Hunger Vital Sign    Worried About Running Out of Food in the Last Year: Never true    Ran Out of Food in the Last Year: Never true  Transportation Needs: No Transportation Needs (02/10/2024)   PRAPARE - Administrator, Civil Service (Medical): No    Lack of Transportation (Non-Medical): No  Physical Activity: Inactive (02/10/2024)   Exercise Vital Sign    Days of Exercise per Week: 0 days    Minutes of Exercise per Session: 0 min  Stress: No Stress Concern Present (02/10/2024)   [employer] of Occupational Health - Occupational Stress Questionnaire    Feeling of Stress :  Only a little  Social Connections: Moderately Integrated (02/10/2024)   Social Connection and Isolation Panel [NHANES]    Frequency of Communication with Friends and Family: More than three times a week    Frequency of Social Gatherings with Friends and Family: More than three times a week    Attends Religious Services: More than 4 times per year    Active Member of [employer] or Organizations: Yes    Attends Banker Meetings: More than 4 times per year    Marital Status: Widowed  Recent Concern: Social Connections - Moderately Isolated (11/20/2023)   Social Connection and Isolation Panel [NHANES]    Frequency of Communication with Friends and Family: More than three times a week    Frequency of Social Gatherings with Friends and Family: Once a week    Attends Religious Services: More than 4 times per year    Active Member of [employer] or Organizations: No    Attends Banker Meetings: Never    Marital Status: Widowed    Review of systems General: negative for malaise, night sweats, fever, chills, weight loss Neck: Negative for lumps, goiter, pain and significant neck swelling Resp: Negative for cough, wheezing, dyspnea at rest CV: Negative for chest pain, leg swelling, palpitations, orthopnea GI: denies melena, hematochezia, nausea, vomiting, diarrhea,  constipation, dysphagia, odyonophagia, early satiety or unintentional weight loss.  The remainder of the review of systems is noncontributory.  Physical Exam: BP 138/65 (BP Location: Left Arm, Patient Position: Sitting, Cuff Size: Normal)   Pulse 60   Temp (!) 97.5 F (36.4 C) (Temporal)   Ht 5' 10.5" (1.791 m)   Wt 235 lb 6.4 oz (106.8 kg)   BMI 33.30 kg/m  General:   Alert and oriented. No distress noted. Pleasant and cooperative.  Head:  Normocephalic and atraumatic. Eyes:  Conjuctiva clear without scleral icterus. Mouth:  Oral mucosa pink and moist. Good dentition. No lesions. Heart: Normal rate and rhythm, s1 and s2 heart sounds present.  Lungs: Clear lung sounds in all lobes. Respirations equal and unlabored. Abdomen:  +BS, soft, non-tender and non-distended. No rebound or guarding. No HSM or masses noted. Neurologic:  Alert and  oriented x4 Psych:  Alert and cooperative. Normal mood and affect.  Invalid input(s): "6 MONTHS"   ASSESSMENT: Lucas Dudley is a 83 y.o. 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 Started on octreotide  in April. Denies rectal bleeding, melena. Following with hematology for iron  infusions, last hgb was 12 and iron  WNL in march. Appears stable from GI standpoint at this time.   PLAN:  -continue iron  pill daily -continue to follow with hematology -continue octreotide  injection every 4 weeks to prevent further GI bleeding from AVMs -continue protonix  40mg  daily  All questions were answered, patient verbalized understanding and is in agreement with plan as outlined above.   Follow Up: 6 months   Lajean Boese L. Adrien Alberta, MSN, APRN, AGNP-C Adult-Gerontology Nurse Practitioner [facility]  I have reviewed the note and agree with the APP's assessment as described in  this progress note  Samantha Cress, MD Gastroenterology and Hepatology [facility] Gastroenterology

## 2024-03-08 NOTE — Progress Notes (Unsigned)
 [facility] [address], [place] [address]   CLINIC:  Medical Oncology/Hematology  PCP:  Meldon Sport, MD [address] [place] [address] [phone]   REASON FOR VISIT:  Follow-up for normocytic anemia  PRIOR THERAPY: History of PRBC transfusions (2023)  CURRENT THERAPY: Oral iron , B12 supplement, intermittent IV iron   INTERVAL HISTORY:   Lucas Dudley 83 y.o. male returns for routine follow-up of normocytic anemia.  He was last seen by Sheril Dines PA-C on 01/13/2024.  In the interim since his last visit, he was hospitalized from 01/13/2024 through 01/28/2024 for workup and treatment of acute hypoxic respiratory failure, decompensated congestive heart failure, and coronary artery disease.  He received Venofer  200 mg while hospitalized, and received IV Feraheme x 1 on 01/30/2024.  He started octreotide  injections via GI office on 02/02/2024.  He does report that he feels "overall better" ever since starting octreotide  and receiving most recent IV iron .  He does have some mild ongoing fatigue, dyspnea on exertion, and ice pica.  He has occasional scant rectal bleeding from hemorrhoids about once a month.  He has not noticed any melena for over 6 months.  Denies any recent epistaxis. He denies any restless legs, headaches, lightheadedness, or syncope.  He is taking daily combination iron /B12 supplement.  He has 75% energy and 100% appetite. He endorses that he is maintaining a stable weight.  ASSESSMENT & PLAN:  1.  Normocytic anemia (iron  deficiency & B12 deficiency): - Patient seen at the request of Gayle Kava, NP -Hospitalized in August 2023 with Hgb 7.6, received 2 units PRBC.  He underwent EGD and colonoscopy which did not show any evidence of bleeding.  Subsequent enteroscopy again did not show a clear source of bleeding. 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  Small bowel nodule -  junction distal duodenum/jejunum.  Irregular with a submucosal component, suspicious in appearance.  Pathology was benign. - Colonoscopy (05/24/2022): 5 mm polyp in the sigmoid colon, diverticulosis in the descending colon and at the splenic flexure.  Pathology - hyperplastic polyp. - EGD (05/24/2022): Normal esophagus, small hiatal hernia, friable gastric mucosa, couple of tiny gastric erosions, normal duodenal bulb and second part of duodenum. - Small bowel endoscopy by Dr. 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 - He was on Eliquis  until September 2023, now s/p Watchman procedure.  Takes aspirin  daily. - He was started monthly octerotide via Gastroenterology as of April 2025 - Reports intermittent scant rectal bleeding from hemorrhoids.  Previously had issues with melanotic stool, but none since starting octreotide  injections - Hematology workup (09/03/2022): Hemoccult stool positive x 2 Normal reticulocytes, haptoglobin, DAT/Coombs. Persistent but improved iron  deficiency with ferritin 33, iron  saturation 17% Vitamin B12 deficiency with low B12 162, normal MMA.  Normal copper . SPEP negative.  Minimal elevations in free light chains with kappa 41.1, normal lambda 21.3, and mildly elevated ratio 1.93.  Immunofixation normal. He has CKD stage IIIa/b - Most recent IV iron  with Feraheme x 1 in April 2025 - He is taking emanation iron /B12 (unknown dose) supplement daily  - Labs today (03/10/2024): Hgb 12.3/MCV 90.5, ferritin 63, iron  saturation 63% with elevated serum iron  264 - DIFFERENTIAL DIAGNOSIS: Suspect iron  deficiency anemia in the setting of occult GI bleeding as well as vitamin B12 deficiency.  Also has aspect of anemia related to his CKD. - PLAN: Recommend IV Feraheme x  1 - Continue daily iron /B12 supplement - Labs in 3 months = CBC/D, BB sample, ferritin, iron /TIBC, B12, MMA - OFFICE visit in 3  months  - Continue GI follow-up (Dr. Sammi Crick and NP Gayle Kava)   2.  Social/family history: - Lives at home by himself.  He is independent of ADLs and IADLs.  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  He has exposure to torrent chemicals.  He smoked 1 pack/day for 35 years and quit smoking in 1992. - No family history of significant anemia. - Mother had mesothelioma.  Maternal aunt had colon cancer.  Maternal uncle had adrenal gland cancer.  PLAN SUMMARY:   >> IV Feraheme x 1 >> Labs in 3 months = CBC/D, ferritin, iron /TIBC, BB sample, B12, MMA >> OFFICE visit in 3 months (1 week after labs) + **possible** same day Feraheme     REVIEW OF SYSTEMS:   Review of Systems  Constitutional:  Positive for fatigue. Negative for appetite change, chills, diaphoresis, fever and unexpected weight change.  HENT:   Negative for lump/mass and nosebleeds.   Eyes:  Negative for eye problems.  Respiratory:  Positive for shortness of breath (with exertion). Negative for cough and hemoptysis.   Cardiovascular:  Negative for chest pain, leg swelling and palpitations.  Gastrointestinal:  Positive for diarrhea and nausea. Negative for abdominal pain, blood in stool, constipation and vomiting.  Genitourinary:  Negative for hematuria.   Skin: Negative.   Neurological:  Negative for dizziness, headaches, light-headedness and numbness.  Hematological:  Does not bruise/bleed easily.  Psychiatric/Behavioral:  Positive for sleep disturbance.      PHYSICAL EXAM:  ECOG PERFORMANCE STATUS: 1 - Symptomatic but completely ambulatory  Vitals:   03/10/24 1500  BP: 135/82  Pulse: (!) 58  Resp: 16  Temp: (!) 97.5 F (36.4 C)  SpO2: 97%    Filed Weights   03/10/24 1454  Weight: 231 lb 11.3 oz (105.1 kg)    Physical Exam Constitutional:      Appearance: Normal appearance. He is obese.  Cardiovascular:     Heart sounds: Normal heart sounds.  Pulmonary:     Breath sounds:  Normal breath sounds.  Neurological:     General: No focal deficit present.     Mental Status: Mental status is at baseline.  Psychiatric:        Behavior: Behavior normal. Behavior is cooperative.     PAST MEDICAL/SURGICAL HISTORY:  Past Medical History:  Diagnosis Date   Arthritis    Atrial fibrillation (HCC)    CAD (coronary artery disease)    a. Cath 03/17/15 showing 100% ostial D1, 50% prox LAD to mid LAD, 40% RPDA stenosis. Med rx. // Myoview  01/2020: EF 31 diffuse perfusion defect without reversibility (suspect artifact); reviewed with Dr. Lupe Salk study felt to be low risk   Cataract    Mixed form OD   Chronic diastolic CHF (congestive heart failure) (HCC)    Chronically elevated hemidiaphragm - Right Side    Diabetic retinopathy (HCC)    NPDR OU   Essential hypertension    Glaucoma    POAG OU   History of gout    Hyperlipidemia    Hypertensive retinopathy    OU   Kidney stones    Melanoma of neck (HCC)    NICM (nonischemic cardiomyopathy) (HCC)    OSA on CPAP 2012   Prostate cancer (HCC)    Type II diabetes mellitus (HCC)    Past Surgical  History:  Procedure Laterality Date   ABDOMINAL HERNIA REPAIR     w/mesh   ATRIAL FIBRILLATION ABLATION N/A 06/06/2021   Procedure: ATRIAL FIBRILLATION ABLATION;  Surgeon: Jolly Needle, MD;  Location: [facility] INVASIVE CV LAB;  Service: Cardiovascular;  Laterality: N/A;   BIOPSY  05/24/2022   Procedure: BIOPSY;  Surgeon: Suzette Espy, MD;  [facility]: [facility];  Service: Endoscopy;;   BIOPSY  05/30/2022   Procedure: BIOPSY;  Surgeon: Suzette Espy, MD;  [facility]: [facility];  Service: Endoscopy;;   BIOPSY  01/10/2023   Procedure: BIOPSY;  Surgeon: Normie Becton., MD;  Location: [facility];  Service: Gastroenterology;;   CARDIAC CATHETERIZATION N/A 03/17/2015   Procedure: Left Heart Cath and Coronary Angiography;  Surgeon: Avanell Leigh, MD;  Location: [facility] INVASIVE CV LAB;  Service: Cardiovascular;   Laterality: N/A;   CARDIOVERSION N/A 08/09/2021   Procedure: CARDIOVERSION;  Surgeon: Maudine Sos, MD;  Location: [facility] ENDOSCOPY;  Service: Cardiovascular;  Laterality: N/A;   CARDIOVERSION N/A 11/10/2021   Procedure: CARDIOVERSION;  Surgeon: Jerryl Morin, DO;  Location: [facility] ENDOSCOPY;  Service: Cardiovascular;  Laterality: N/A;   carotid doppler  09/17/2008   rigt and left ICAs 0-49%;mildly  abnormal   CATARACT EXTRACTION Left 2020   Dr. Reyne Cave   COLONOSCOPY N/A 05/16/2017   Procedure: COLONOSCOPY;  Surgeon: Ruby Corporal, MD;  [facility];  Service: Endoscopy;  Laterality: N/A;  930   COLONOSCOPY WITH PROPOFOL  N/A 05/24/2022   Procedure: COLONOSCOPY WITH PROPOFOL ;  Surgeon: Suzette Espy, MD;  [facility];  Service: Endoscopy;  Laterality: N/A;   DOPPLER ECHOCARDIOGRAPHY  05/25/2009   EF 50-55%,LA mildly dilated, LV function normal   ELECTROPHYSIOLOGIC STUDY N/A 04/05/2015   Procedure: Cardioversion;  Surgeon: Luana Rumple, MD;  Location: [facility];  Service: Cardiovascular;  Laterality: N/A;   ELECTROPHYSIOLOGIC STUDY N/A 09/06/2015   Procedure: Atrial Fibrillation Ablation;  Surgeon: Jolly Needle, MD;  Location: [facility] INVASIVE CV LAB;  Service: Cardiovascular;  Laterality: N/A;   ELECTROPHYSIOLOGIC STUDY N/A 07/12/2016   redo afib ablation by Dr Nunzio Belch   ENTEROSCOPY N/A 05/30/2022   Procedure: ENTEROSCOPY;  Surgeon: Suzette Espy, MD;  [facility];  Service: Endoscopy;  Laterality: N/A;   ENTEROSCOPY N/A 01/07/2024   Procedure: ENTEROSCOPY;  Surgeon: Urban Garden, MD;  [facility];  Service: Gastroenterology;  Laterality: N/A;  1:45PM;ASA 3   ESOPHAGOGASTRODUODENOSCOPY (EGD) WITH PROPOFOL  N/A 05/24/2022   Procedure: ESOPHAGOGASTRODUODENOSCOPY (EGD) WITH PROPOFOL ;  Surgeon: Suzette Espy, MD;  [facility];  Service: Endoscopy;  Laterality: N/A;   ESOPHAGOGASTRODUODENOSCOPY (EGD) WITH PROPOFOL  N/A  01/10/2023   Procedure: ESOPHAGOGASTRODUODENOSCOPY (EGD) WITH PROPOFOL ;  Surgeon: Brice Campi Albino Alu., MD;  Location: [facility] ENDOSCOPY;  Service: Gastroenterology;  Laterality: N/A;   EUS N/A 01/10/2023   Procedure: UPPER ENDOSCOPIC ULTRASOUND (EUS) RADIAL;  Surgeon: Normie Becton., MD;  Location: [facility] ENDOSCOPY;  Service: Gastroenterology;  Laterality: N/A;   EXCISIONAL HEMORRHOIDECTOMY     "inside and out"   EYE SURGERY Left 2020   Cat Sx - Dr. Reyne Cave   FINE NEEDLE ASPIRATION Right    knee; "drew ~ 1 quart off"   GIVENS CAPSULE STUDY N/A 05/25/2022   Procedure: GIVENS CAPSULE STUDY;  Surgeon: Vinetta Greening, DO;  [facility];  Service: Endoscopy;  Laterality: N/A;   GIVENS CAPSULE STUDY N/A 01/23/2023   Procedure: GIVENS CAPSULE STUDY;  Surgeon: Urban Garden, MD;  [facility];  Service: Gastroenterology;  Laterality: N/A;  8:30am   HERNIA REPAIR     HOT HEMOSTASIS N/A 01/10/2023   Procedure: HOT HEMOSTASIS (ARGON PLASMA COAGULATION/BICAP);  Surgeon: Normie Becton., MD;  Location: [facility];  Service: Gastroenterology;  Laterality: N/A;   HOT HEMOSTASIS  01/07/2024   Procedure: EGD, WITH ARGON PLASMA COAGULATION;  Surgeon: Urban Garden, MD;  [facility];  Service: Gastroenterology;;   LAPAROSCOPIC CHOLECYSTECTOMY     LEFT ATRIAL APPENDAGE OCCLUSION N/A 06/28/2022   Procedure: LEFT ATRIAL APPENDAGE OCCLUSION;  Surgeon: Arnoldo Lapping, MD;  Location: [facility] INVASIVE CV LAB;  Service: Cardiovascular;  Laterality: N/A;   MELANOMA EXCISION Right    "neck"   NM MYOCAR PERF WALL MOTION  02/21/2012   EF 61% ,EXERCISE 7 METS. exercise stopped due to wheezing and shortness of breathe   POLYPECTOMY  05/16/2017   Procedure: POLYPECTOMY;  Surgeon: Ruby Corporal, MD;  [facility];  Service: Endoscopy;;  colon   POLYPECTOMY  05/24/2022   Procedure: POLYPECTOMY;  Surgeon: Suzette Espy, MD;  [facility];   Service: Endoscopy;;   POLYPECTOMY  01/10/2023   Procedure: POLYPECTOMY;  Surgeon: Brice Campi Albino Alu., MD;  Location: [facility];  Service: Gastroenterology;;   PROSTATECTOMY     RIGHT/LEFT HEART CATH AND CORONARY ANGIOGRAPHY N/A 01/27/2024   Procedure: RIGHT/LEFT HEART CATH AND CORONARY ANGIOGRAPHY;  Surgeon: Lauralee Poll, MD;  Location: [facility] INVASIVE CV LAB;  Service: Cardiovascular;  Laterality: N/A;   SHOULDER OPEN ROTATOR CUFF REPAIR Right X 2   SUBMUCOSAL TATTOO INJECTION  05/30/2022   Procedure: SUBMUCOSAL TATTOO INJECTION;  Surgeon: Suzette Espy, MD;  [facility];  Service: Endoscopy;;   SUBMUCOSAL TATTOO INJECTION  01/10/2023   Procedure: SUBMUCOSAL TATTOO INJECTION;  Surgeon: Normie Becton., MD;  Location: [facility]OS[facility];  Service: Gastroenterology;;   TEE WITHOUT CARDIOVERSION N/A 09/05/2015   Procedure: TRANSESOPHAGEAL ECHOCARDIOGRAM (TEE);  Surgeon: Jacqueline Matsu, MD;  Location: [facility] ENDOSCOPY;  Service: Cardiovascular;  Laterality: N/A;   TEE WITHOUT CARDIOVERSION N/A 06/28/2022   Procedure: TRANSESOPHAGEAL ECHOCARDIOGRAM (TEE);  Surgeon: Arnoldo Lapping, MD;  Location: [facility] INVASIVE CV LAB;  Service: Cardiovascular;  Laterality: N/A;    SOCIAL HISTORY:  Social History   Socioeconomic History   Marital status: Widowed    Spouse name: Not on file   Number of children: 1   Years of education: Not on file   Highest education level: GED or equivalent  Occupational History   Occupation: Retired  Tobacco Use   Smoking status: Former    Current packs/day: 0.00    Average packs/day: 2.0 packs/day for 27.0 years (54.0 ttl pk-yrs)    Types: Cigarettes    Start date: 40    Quit date: 1992    Years since quitting: 33.4    Passive exposure: Current   Smokeless tobacco: Never   Tobacco comments:    Former smoker 09/27/21  Vaping Use   Vaping status: Never Used  Substance and Sexual Activity   Alcohol use: No    Alcohol/week: 0.0 standard drinks  of alcohol    Comment: "used to drink; stopped ~ 2008"   Drug use: No   Sexual activity: Not Currently  Other Topics Concern   Not on file  Social History Narrative   Lives in [place], [place] with wife.   Social Drivers of Corporate investment banker Strain: Low Risk  (02/10/2024)   Overall Physicist, medical  Strain (CARDIA)    Difficulty of Paying Living Expenses: Not very hard  Food Insecurity: No Food Insecurity (02/10/2024)   Hunger Vital Sign    Worried About Running Out of Food in the Last Year: Never true    Ran Out of Food in the Last Year: Never true  Transportation Needs: No Transportation Needs (02/10/2024)   PRAPARE - Administrator, Civil Service (Medical): No    Lack of Transportation (Non-Medical): No  Physical Activity: Inactive (02/10/2024)   Exercise Vital Sign    Days of Exercise per Week: 0 days    Minutes of Exercise per Session: 0 min  Stress: No Stress Concern Present (02/10/2024)   [employer] of Occupational Health - Occupational Stress Questionnaire    Feeling of Stress : Only a little  Social Connections: Moderately Integrated (02/10/2024)   Social Connection and Isolation Panel [NHANES]    Frequency of Communication with Friends and Family: More than three times a week    Frequency of Social Gatherings with Friends and Family: More than three times a week    Attends Religious Services: More than 4 times per year    Active Member of [employer] or Organizations: Yes    Attends Banker Meetings: More than 4 times per year    Marital Status: Widowed  Recent Concern: Social Connections - Moderately Isolated (11/20/2023)   Social Connection and Isolation Panel [NHANES]    Frequency of Communication with Friends and Family: More than three times a week    Frequency of Social Gatherings with Friends and Family: Once a week    Attends Religious Services: More than 4 times per year    Active Member of [employer] or Organizations: No    Attends Occupational hygienist Meetings: Never    Marital Status: Widowed  Intimate Partner Violence: Not At Risk (01/29/2024)   Humiliation, Afraid, Rape, and Kick questionnaire    Fear of Current or Ex-Partner: No    Emotionally Abused: No    Physically Abused: No    Sexually Abused: No    FAMILY HISTORY:  Family History  Problem Relation Age of Onset   Heart disease Mother    Lung cancer Mother    Heart attack Mother 82   Diabetes Father    Heart disease Father    Stroke Brother    Healthy Daughter    Colon cancer Maternal Aunt        70s    CURRENT MEDICATIONS:  Outpatient Encounter Medications as of 03/10/2024  Medication Sig   albuterol  (VENTOLIN  HFA) 108 (90 Base) MCG/ACT inhaler Inhale 2 puffs into the lungs every 6 (six) hours as needed for wheezing or shortness of breath.   ammonium lactate  (AMLACTIN DAILY) 12 % lotion Apply 1 Application topically as needed.   aspirin  81 MG chewable tablet Chew 81 mg by mouth daily.   camphor-menthol (ANTI-ITCH) lotion Apply 1 Application topically daily as needed for itching.   COMBIGAN  0.2-0.5 % ophthalmic solution Place 1 drop into both eyes 2 (two) times daily.   dapagliflozin  propanediol (FARXIGA ) 10 MG TABS tablet Take 1 tablet (10 mg total) by mouth daily.   diphenhydramine -acetaminophen  (TYLENOL  PM) 25-500 MG TABS tablet Take 1 tablet by mouth at bedtime.   ELDERBERRY PO Take 2 tablets by mouth daily. Chewable   ferrous sulfate  325 (65 FE) MG tablet Take 1 tablet (325 mg total) by mouth daily with breakfast.   fluticasone  (FLONASE ) 50 MCG/ACT nasal spray SPRAY 2  SPRAYS INTO EACH NOSTRIL EVERY DAY (Patient taking differently: Place 2 sprays into both nostrils daily.)   gemfibrozil  (LOPID ) 600 MG tablet Take 600 mg by mouth at bedtime.    glimepiride  (AMARYL ) 2 MG tablet Take 1 tablet (2 mg total) by mouth daily with breakfast. (Patient taking differently: Take 2 mg by mouth at bedtime.)   Insulin  Glargine (BASAGLAR  KWIKPEN) 100 UNIT/ML Inject  45 Units into the skin at bedtime. (Patient taking differently: Inject 32 Units into the skin at bedtime.)   magnesium  gluconate (MAGONATE) 500 MG tablet Take 500 mg by mouth daily.   mineral oil liquid Take 15 mLs by mouth daily as needed for moderate constipation.   Omega-3 Fatty Acids (FISH OIL EXTRA STRENGTH) 1200 MG CAPS Take 1 capsule by mouth daily.   OVER THE COUNTER MEDICATION Take 1 tablet by mouth daily. Beets Chew   pantoprazole  (PROTONIX ) 40 MG tablet TAKE 1 TABLET BY MOUTH EVERY DAY   torsemide  (DEMADEX ) 20 MG tablet Take 1 tablet (20 mg total) by mouth 2 (two) times daily.   vitamin E 45 MG (100 UNITS) capsule Take 100 Units by mouth daily.   zinc  gluconate 50 MG tablet Take 50 mg by mouth daily.   No facility-administered encounter medications on file as of 03/10/2024.    ALLERGIES:  Allergies  Allergen Reactions   Azithromycin  Nausea Only   Tape Rash and Other (See Comments)    Causes skin redness, Use paper tape only.    LABORATORY DATA:  I have reviewed the labs as listed.  CBC    Component Value Date/Time   WBC 7.4 03/10/2024 1307   RBC 4.31 03/10/2024 1307   HGB 12.3 (L) 03/10/2024 1307   HGB 11.7 (L) 12/30/2023 1322   HCT 39.0 03/10/2024 1307   HCT 37.4 (L) 12/30/2023 1322   PLT 334 03/10/2024 1307   PLT 465 (H) 12/30/2023 1322   MCV 90.5 03/10/2024 1307   MCV 93 12/30/2023 1322   MCH 28.5 03/10/2024 1307   MCHC 31.5 03/10/2024 1307   RDW 17.2 (H) 03/10/2024 1307   RDW 18.8 (H) 12/30/2023 1322   LYMPHSABS 1.0 03/10/2024 1307   LYMPHSABS 0.9 12/30/2023 1322   MONOABS 0.5 03/10/2024 1307   EOSABS 0.3 03/10/2024 1307   EOSABS 0.3 12/30/2023 1322   BASOSABS 0.0 03/10/2024 1307   BASOSABS 0.1 12/30/2023 1322      Latest Ref Rng & Units 02/12/2024    1:33 PM 02/04/2024    3:26 PM 01/28/2024    3:14 AM  CMP  Glucose 70 - 99 mg/dL 161  096  75   BUN 8 - 23 mg/dL 28  32  32   Creatinine 0.61 - 1.24 mg/dL 0.45  4.09  8.11   Sodium 135 - 145 mmol/L 132   134  136   Potassium 3.5 - 5.1 mmol/L 5.0  5.7  3.9   Chloride 98 - 111 mmol/L 94  95  92   CO2 22 - 32 mmol/L 25  26  34   Calcium  8.9 - 10.3 mg/dL 9.2  9.1  9.0     DIAGNOSTIC IMAGING:  I have independently reviewed the relevant imaging and discussed with the patient.   WRAP UP:  All questions were answered. The patient knows to call the clinic with any problems, questions or concerns.  Medical decision making: Moderate  Time spent on visit: I spent 20 minutes counseling the patient face to face. The total time spent in the appointment  was 30 minutes and more than 50% was on counseling.  Sonnie Dusky, PA-C  03/10/24 4:01 PM

## 2024-03-10 ENCOUNTER — Encounter: Attending: Gastroenterology | Admitting: *Deleted

## 2024-03-10 ENCOUNTER — Ambulatory Visit (HOSPITAL_COMMUNITY): Payer: Self-pay | Admitting: Adult Health

## 2024-03-10 ENCOUNTER — Inpatient Hospital Stay: Attending: Hematology | Admitting: Physician Assistant

## 2024-03-10 ENCOUNTER — Inpatient Hospital Stay

## 2024-03-10 VITALS — BP 135/82 | HR 58 | Temp 97.5°F | Resp 16 | Wt 231.7 lb

## 2024-03-10 VITALS — BP 151/78 | Temp 97.5°F | Resp 16

## 2024-03-10 DIAGNOSIS — G479 Sleep disorder, unspecified: Secondary | ICD-10-CM | POA: Insufficient documentation

## 2024-03-10 DIAGNOSIS — Z881 Allergy status to other antibiotic agents status: Secondary | ICD-10-CM | POA: Diagnosis not present

## 2024-03-10 DIAGNOSIS — Z87891 Personal history of nicotine dependence: Secondary | ICD-10-CM | POA: Diagnosis not present

## 2024-03-10 DIAGNOSIS — I4891 Unspecified atrial fibrillation: Secondary | ICD-10-CM | POA: Diagnosis not present

## 2024-03-10 DIAGNOSIS — Z7982 Long term (current) use of aspirin: Secondary | ICD-10-CM | POA: Insufficient documentation

## 2024-03-10 DIAGNOSIS — K5520 Angiodysplasia of colon without hemorrhage: Secondary | ICD-10-CM | POA: Diagnosis not present

## 2024-03-10 DIAGNOSIS — D631 Anemia in chronic kidney disease: Secondary | ICD-10-CM | POA: Diagnosis present

## 2024-03-10 DIAGNOSIS — K649 Unspecified hemorrhoids: Secondary | ICD-10-CM | POA: Insufficient documentation

## 2024-03-10 DIAGNOSIS — D509 Iron deficiency anemia, unspecified: Secondary | ICD-10-CM | POA: Insufficient documentation

## 2024-03-10 DIAGNOSIS — Z79899 Other long term (current) drug therapy: Secondary | ICD-10-CM | POA: Diagnosis not present

## 2024-03-10 DIAGNOSIS — I5032 Chronic diastolic (congestive) heart failure: Secondary | ICD-10-CM | POA: Insufficient documentation

## 2024-03-10 DIAGNOSIS — D500 Iron deficiency anemia secondary to blood loss (chronic): Secondary | ICD-10-CM

## 2024-03-10 DIAGNOSIS — Z801 Family history of malignant neoplasm of trachea, bronchus and lung: Secondary | ICD-10-CM | POA: Insufficient documentation

## 2024-03-10 DIAGNOSIS — I2510 Atherosclerotic heart disease of native coronary artery without angina pectoris: Secondary | ICD-10-CM | POA: Diagnosis not present

## 2024-03-10 DIAGNOSIS — Z9049 Acquired absence of other specified parts of digestive tract: Secondary | ICD-10-CM | POA: Diagnosis not present

## 2024-03-10 DIAGNOSIS — Z9079 Acquired absence of other genital organ(s): Secondary | ICD-10-CM | POA: Insufficient documentation

## 2024-03-10 DIAGNOSIS — E538 Deficiency of other specified B group vitamins: Secondary | ICD-10-CM | POA: Diagnosis not present

## 2024-03-10 DIAGNOSIS — R5383 Other fatigue: Secondary | ICD-10-CM | POA: Diagnosis not present

## 2024-03-10 DIAGNOSIS — Z823 Family history of stroke: Secondary | ICD-10-CM | POA: Insufficient documentation

## 2024-03-10 DIAGNOSIS — Z800 Family history of malignant neoplasm of digestive organs: Secondary | ICD-10-CM | POA: Insufficient documentation

## 2024-03-10 DIAGNOSIS — N1831 Chronic kidney disease, stage 3a: Secondary | ICD-10-CM | POA: Insufficient documentation

## 2024-03-10 DIAGNOSIS — G4733 Obstructive sleep apnea (adult) (pediatric): Secondary | ICD-10-CM | POA: Diagnosis not present

## 2024-03-10 DIAGNOSIS — R0609 Other forms of dyspnea: Secondary | ICD-10-CM | POA: Insufficient documentation

## 2024-03-10 DIAGNOSIS — R0602 Shortness of breath: Secondary | ICD-10-CM | POA: Diagnosis not present

## 2024-03-10 DIAGNOSIS — E785 Hyperlipidemia, unspecified: Secondary | ICD-10-CM | POA: Diagnosis not present

## 2024-03-10 DIAGNOSIS — I130 Hypertensive heart and chronic kidney disease with heart failure and stage 1 through stage 4 chronic kidney disease, or unspecified chronic kidney disease: Secondary | ICD-10-CM | POA: Diagnosis not present

## 2024-03-10 DIAGNOSIS — R197 Diarrhea, unspecified: Secondary | ICD-10-CM | POA: Insufficient documentation

## 2024-03-10 DIAGNOSIS — R110 Nausea: Secondary | ICD-10-CM | POA: Insufficient documentation

## 2024-03-10 DIAGNOSIS — Z8249 Family history of ischemic heart disease and other diseases of the circulatory system: Secondary | ICD-10-CM | POA: Insufficient documentation

## 2024-03-10 DIAGNOSIS — E1122 Type 2 diabetes mellitus with diabetic chronic kidney disease: Secondary | ICD-10-CM | POA: Insufficient documentation

## 2024-03-10 DIAGNOSIS — F5089 Other specified eating disorder: Secondary | ICD-10-CM | POA: Diagnosis not present

## 2024-03-10 DIAGNOSIS — Z833 Family history of diabetes mellitus: Secondary | ICD-10-CM | POA: Insufficient documentation

## 2024-03-10 LAB — IRON AND TIBC
Iron: 264 ug/dL — ABNORMAL HIGH (ref 45–182)
Saturation Ratios: 63 % — ABNORMAL HIGH (ref 17.9–39.5)
TIBC: 422 ug/dL (ref 250–450)
UIBC: 158 ug/dL

## 2024-03-10 LAB — CBC WITH DIFFERENTIAL/PLATELET
Abs Immature Granulocytes: 0.02 10*3/uL (ref 0.00–0.07)
Basophils Absolute: 0 10*3/uL (ref 0.0–0.1)
Basophils Relative: 1 %
Eosinophils Absolute: 0.3 10*3/uL (ref 0.0–0.5)
Eosinophils Relative: 3 %
HCT: 39 % (ref 39.0–52.0)
Hemoglobin: 12.3 g/dL — ABNORMAL LOW (ref 13.0–17.0)
Immature Granulocytes: 0 %
Lymphocytes Relative: 14 %
Lymphs Abs: 1 10*3/uL (ref 0.7–4.0)
MCH: 28.5 pg (ref 26.0–34.0)
MCHC: 31.5 g/dL (ref 30.0–36.0)
MCV: 90.5 fL (ref 80.0–100.0)
Monocytes Absolute: 0.5 10*3/uL (ref 0.1–1.0)
Monocytes Relative: 7 %
Neutro Abs: 5.6 10*3/uL (ref 1.7–7.7)
Neutrophils Relative %: 75 %
Platelets: 334 10*3/uL (ref 150–400)
RBC: 4.31 MIL/uL (ref 4.22–5.81)
RDW: 17.2 % — ABNORMAL HIGH (ref 11.5–15.5)
WBC: 7.4 10*3/uL (ref 4.0–10.5)
nRBC: 0 % (ref 0.0–0.2)

## 2024-03-10 LAB — SAMPLE TO BLOOD BANK

## 2024-03-10 LAB — FERRITIN: Ferritin: 63 ng/mL (ref 24–336)

## 2024-03-10 MED ORDER — OCTREOTIDE ACETATE 20 MG IM KIT
20.0000 mg | PACK | Freq: Once | INTRAMUSCULAR | Status: AC
  Administered 2024-03-10: 20 mg via INTRAMUSCULAR

## 2024-03-10 NOTE — Patient Instructions (Signed)
 [facility] at [facility] **VISIT SUMMARY & IMPORTANT INSTRUCTIONS **   You were seen today by Sheril Dines PA-C for your anemia.    IRON  DEFICIENCY ANEMIA This is most likely caused by blood loss in your stomach and intestines.  Continue to follow-up with gastroenterology and seek immediate medical attention if you notice any signs of major blood loss (bright red blood in the toilet or black/tarry bowel movements). Continue to take iron /B12 supplement daily. We will schedule you for 1 dose of IV iron .  B12 DEFICIENCY Continue taking daily iron /B12 supplement.  FOLLOW-UP APPOINTMENT: Office visit in 3 months (labs 1 week prior)  ** Thank you for trusting me with your healthcare!  I strive to provide all of my patients with quality care at each visit.  If you receive a survey for this visit, I would be so grateful to you for taking the time to provide feedback.  Thank you in advance!  ~ Jawara Latorre                   Dr. Paulett Boros   &   Sheril Dines, PA-C   - - - - - - - - - - - - - - - - - -    Thank you for choosing [facility] at [facility] to provide your oncology and hematology care.  To afford each patient quality time with our provider, please arrive at least 15 minutes before your scheduled appointment time.   If you have a lab appointment with the Cancer Center please come in thru the Main Entrance and check in at the main information desk.  You need to re-schedule your appointment should you arrive 10 or more minutes late.  We strive to give you quality time with our providers, and arriving late affects you and other patients whose appointments are after yours.  Also, if you no show three or more times for appointments you may be dismissed from the clinic at the providers discretion.     Again, thank you for choosing [facility].  Our hope is that these requests will decrease the amount of time that you  wait before being seen by our physicians.       _____________________________________________________________  Should you have questions after your visit to [facility], please contact our office at [phone] and follow the prompts.  Our office hours are 8:00 a.m. and 4:30 p.m. Monday - Friday.  Please note that voicemails left after 4:00 p.m. may not be returned until the following business day.  We are closed weekends and major holidays.  You do have access to a nurse 24-7, just call the main number to the clinic [phone] and do not press any options, hold on the line and a nurse will answer the phone.    For prescription refill requests, have your pharmacy contact our office and allow 72 hours.     Should you have questions after your visit to [facility], please contact our office at [phone] and follow the prompts.  Our office hours are 8:00 a.m. and 4:30 p.m. Monday - Friday.  Please note that voicemails left after 4:00 p.m. may not be returned until the following business day.  We are closed weekends and major holidays.  You do have access to a nurse 24-7, just call the main number to the clinic [phone] and do not press any options, hold on the  line and a nurse will answer the phone.    For prescription refill requests, have your pharmacy contact our office and allow 72 hours.

## 2024-03-10 NOTE — Progress Notes (Signed)
 Diagnosis: AVM of small bowel  Provider:  Samantha Cress MD  Procedure: Injection  Sandostatin , Dose: 20 mg, Site: intramuscular, Number of injections: 1  Injection Site(s): Right upper quad. gluteus  Post Care: Observation period completed  Discharge: Condition: Good, Destination: Home . AVS Provided  Performed by:  Verneda Golder, RN

## 2024-03-12 NOTE — Progress Notes (Signed)
 [facility] - Clinic Note  03/13/2024    CHIEF COMPLAINT Patient presents for Retina Follow Up  HISTORY OF PRESENT ILLNESS: Lucas Dudley is a 83 y.o. male who presents to the clinic today for:  HPI     Retina Follow Up   Patient presents with  Diabetic Retinopathy.  In both eyes.  This started 5 weeks ago.  I, the attending physician,  performed the HPI with the patient and updated documentation appropriately.        Comments   Patient here for 5 weeks retina follow up for NPDR OU. Patient states vision doing fairly well. Some days better than other days. No eye pain. Using drops regularly.      Last edited by Ronelle Coffee, MD on 03/13/2024  3:56 PM.     Pt states    Referring physician: Meldon Sport, MD [address] [place],  [place] [address]  HISTORICAL INFORMATION:   Selected notes from the MEDICAL RECORD NUMBER Referred by Dr. Reyne Cave for DEE   CURRENT MEDICATIONS: Current Outpatient Medications (Ophthalmic Drugs)  Medication Sig   COMBIGAN  0.2-0.5 % ophthalmic solution Place 1 drop into both eyes 2 (two) times daily.   No current facility-administered medications for this visit. (Ophthalmic Drugs)   Current Outpatient Medications (Other)  Medication Sig   albuterol  (VENTOLIN  HFA) 108 (90 Base) MCG/ACT inhaler Inhale 2 puffs into the lungs every 6 (six) hours as needed for wheezing or shortness of breath.   ammonium lactate  (AMLACTIN DAILY) 12 % lotion Apply 1 Application topically as needed.   aspirin  81 MG chewable tablet Chew 81 mg by mouth daily.   camphor-menthol (ANTI-ITCH) lotion Apply 1 Application topically daily as needed for itching.   dapagliflozin  propanediol (FARXIGA ) 10 MG TABS tablet Take 1 tablet (10 mg total) by mouth daily.   diphenhydramine -acetaminophen  (TYLENOL  PM) 25-500 MG TABS tablet Take 1 tablet by mouth at bedtime.   ELDERBERRY PO Take 2 tablets by mouth daily. Chewable   ferrous sulfate  325 (65 FE) MG tablet  Take 1 tablet (325 mg total) by mouth daily with breakfast.   fluticasone  (FLONASE ) 50 MCG/ACT nasal spray SPRAY 2 SPRAYS INTO EACH NOSTRIL EVERY DAY (Patient taking differently: Place 2 sprays into both nostrils daily.)   gemfibrozil  (LOPID ) 600 MG tablet Take 600 mg by mouth at bedtime.    glimepiride  (AMARYL ) 2 MG tablet Take 1 tablet (2 mg total) by mouth daily with breakfast. (Patient taking differently: Take 2 mg by mouth at bedtime.)   Insulin  Glargine (BASAGLAR  KWIKPEN) 100 UNIT/ML Inject 45 Units into the skin at bedtime. (Patient taking differently: Inject 32 Units into the skin at bedtime.)   magnesium  gluconate (MAGONATE) 500 MG tablet Take 500 mg by mouth daily.   mineral oil liquid Take 15 mLs by mouth daily as needed for moderate constipation.   Omega-3 Fatty Acids (FISH OIL EXTRA STRENGTH) 1200 MG CAPS Take 1 capsule by mouth daily.   OVER THE COUNTER MEDICATION Take 1 tablet by mouth daily. Beets Chew   pantoprazole  (PROTONIX ) 40 MG tablet TAKE 1 TABLET BY MOUTH EVERY DAY   torsemide  (DEMADEX ) 20 MG tablet Take 1 tablet (20 mg total) by mouth 2 (two) times daily.   vitamin E 45 MG (100 UNITS) capsule Take 100 Units by mouth daily.   zinc  gluconate 50 MG tablet Take 50 mg by mouth daily.   No current facility-administered medications for this visit. (Other)   REVIEW OF SYSTEMS:  ROS   Positive for: Gastrointestinal, Endocrine, Cardiovascular, Eyes, Respiratory Negative for: Constitutional, Neurological, Skin, Genitourinary, Musculoskeletal, HENT, Psychiatric, Allergic/Imm, Heme/Lymph Last edited by Sylvan Evener, COA on 03/13/2024  1:59 PM.       ALLERGIES Allergies  Allergen Reactions   Azithromycin  Nausea Only   Tape Rash and Other (See Comments)    Causes skin redness, Use paper tape only.   PAST MEDICAL HISTORY Past Medical History:  Diagnosis Date   Arthritis    Atrial fibrillation (HCC)    CAD (coronary artery disease)    a. Cath 03/17/15 showing 100%  ostial D1, 50% prox LAD to mid LAD, 40% RPDA stenosis. Med rx. // Myoview  01/2020: EF 31 diffuse perfusion defect without reversibility (suspect artifact); reviewed with Dr. Lupe Salk study felt to be low risk   Cataract    Mixed form OD   Chronic diastolic CHF (congestive heart failure) (HCC)    Chronically elevated hemidiaphragm - Right Side    Diabetic retinopathy (HCC)    NPDR OU   Essential hypertension    Glaucoma    POAG OU   History of gout    Hyperlipidemia    Hypertensive retinopathy    OU   Kidney stones    Melanoma of neck (HCC)    NICM (nonischemic cardiomyopathy) (HCC)    OSA on CPAP 2012   Prostate cancer (HCC)    Type II diabetes mellitus (HCC)    Past Surgical History:  Procedure Laterality Date   ABDOMINAL HERNIA REPAIR     w/mesh   ATRIAL FIBRILLATION ABLATION N/A 06/06/2021   Procedure: ATRIAL FIBRILLATION ABLATION;  Surgeon: Jolly Needle, MD;  Location: [facility] INVASIVE CV LAB;  Service: Cardiovascular;  Laterality: N/A;   BIOPSY  05/24/2022   Procedure: BIOPSY;  Surgeon: Suzette Espy, MD;  Location: [facility];  Service: Endoscopy;;   BIOPSY  05/30/2022   Procedure: BIOPSY;  Surgeon: Suzette Espy, MD;  [facility]: [facility];  Service: Endoscopy;;   BIOPSY  01/10/2023   Procedure: BIOPSY;  Surgeon: Normie Becton., MD;  Location: [facility];  Service: Gastroenterology;;   CARDIAC CATHETERIZATION N/A 03/17/2015   Procedure: Left Heart Cath and Coronary Angiography;  Surgeon: Avanell Leigh, MD;  Location: [facility] INVASIVE CV LAB;  Service: Cardiovascular;  Laterality: N/A;   CARDIOVERSION N/A 08/09/2021   Procedure: CARDIOVERSION;  Surgeon: Maudine Sos, MD;  Location: [facility] ENDOSCOPY;  Service: Cardiovascular;  Laterality: N/A;   CARDIOVERSION N/A 11/10/2021   Procedure: CARDIOVERSION;  Surgeon: Jerryl Morin, DO;  Location: [facility]OSCOPY;  Service: Cardiovascular;  Laterality: N/A;   carotid doppler  09/17/2008   rigt and left ICAs 0-49%;mildly   abnormal   CATARACT EXTRACTION Left 2020   Dr. Reyne Cave   COLONOSCOPY N/A 05/16/2017   Procedure: COLONOSCOPY;  Surgeon: Ruby Corporal, MD;  [facility]: [facility];  Service: Endoscopy;  Laterality: N/A;  930   COLONOSCOPY WITH PROPOFOL  N/A 05/24/2022   Procedure: COLONOSCOPY WITH PROPOFOL ;  Surgeon: Suzette Espy, MD;  [facility]: [facility];  Service: Endoscopy;  Laterality: N/A;   DOPPLER ECHOCARDIOGRAPHY  05/25/2009   EF 50-55%,LA mildly dilated, LV function normal   ELECTROPHYSIOLOGIC STUDY N/A 04/05/2015   Procedure: Cardioversion;  Surgeon: Luana Rumple, MD;  Location: [facility];  Service: Cardiovascular;  Laterality: N/A;   ELECTROPHYSIOLOGIC STUDY N/A 09/06/2015   Procedure: Atrial Fibrillation Ablation;  Surgeon: Jolly Needle, MD;  Location: [facility] INVASIVE CV LAB;  Service: Cardiovascular;  Laterality: N/A;  ELECTROPHYSIOLOGIC STUDY N/A 07/12/2016   redo afib ablation by Dr Nunzio Belch   ENTEROSCOPY N/A 05/30/2022   Procedure: ENTEROSCOPY;  Surgeon: Suzette Espy, MD;  [facility];  Service: Endoscopy;  Laterality: N/A;   ENTEROSCOPY N/A 01/07/2024   Procedure: ENTEROSCOPY;  Surgeon: Urban Garden, MD;  [facility];  Service: Gastroenterology;  Laterality: N/A;  1:45PM;ASA 3   ESOPHAGOGASTRODUODENOSCOPY (EGD) WITH PROPOFOL  N/A 05/24/2022   Procedure: ESOPHAGOGASTRODUODENOSCOPY (EGD) WITH PROPOFOL ;  Surgeon: Suzette Espy, MD;  [facility];  Service: Endoscopy;  Laterality: N/A;   ESOPHAGOGASTRODUODENOSCOPY (EGD) WITH PROPOFOL  N/A 01/10/2023   Procedure: ESOPHAGOGASTRODUODENOSCOPY (EGD) WITH PROPOFOL ;  Surgeon: Brice Campi Albino Alu., MD;  Location: [facility] ENDOSCOPY;  Service: Gastroenterology;  Laterality: N/A;   EUS N/A 01/10/2023   Procedure: UPPER ENDOSCOPIC ULTRASOUND (EUS) RADIAL;  Surgeon: Normie Becton., MD;  Location: [facility] ENDOSCOPY;  Service: Gastroenterology;  Laterality: N/A;   EXCISIONAL HEMORRHOIDECTOMY      "inside and out"   EYE SURGERY Left 2020   Cat Sx - Dr. Reyne Cave   FINE NEEDLE ASPIRATION Right    knee; "drew ~ 1 quart off"   GIVENS CAPSULE STUDY N/A 05/25/2022   Procedure: GIVENS CAPSULE STUDY;  Surgeon: Vinetta Greening, DO;  [facility]: [facility];  Service: Endoscopy;  Laterality: N/A;   GIVENS CAPSULE STUDY N/A 01/23/2023   Procedure: GIVENS CAPSULE STUDY;  Surgeon: Urban Garden, MD;  [facility]: [facility];  Service: Gastroenterology;  Laterality: N/A;  8:30am   HERNIA REPAIR     HOT HEMOSTASIS N/A 01/10/2023   Procedure: HOT HEMOSTASIS (ARGON PLASMA COAGULATION/BICAP);  Surgeon: Normie Becton., MD;  Location: [facility];  Service: Gastroenterology;  Laterality: N/A;   HOT HEMOSTASIS  01/07/2024   Procedure: EGD, WITH ARGON PLASMA COAGULATION;  Surgeon: Urban Garden, MD;  [facility];  Service: Gastroenterology;;   LAPAROSCOPIC CHOLECYSTECTOMY     LEFT ATRIAL APPENDAGE OCCLUSION N/A 06/28/2022   Procedure: LEFT ATRIAL APPENDAGE OCCLUSION;  Surgeon: Arnoldo Lapping, MD;  Location: [facility] INVASIVE CV LAB;  Service: Cardiovascular;  Laterality: N/A;   MELANOMA EXCISION Right    "neck"   NM MYOCAR PERF WALL MOTION  02/21/2012   EF 61% ,EXERCISE 7 METS. exercise stopped due to wheezing and shortness of breathe   POLYPECTOMY  05/16/2017   Procedure: POLYPECTOMY;  Surgeon: Ruby Corporal, MD;  [facility];  Service: Endoscopy;;  colon   POLYPECTOMY  05/24/2022   Procedure: POLYPECTOMY;  Surgeon: Suzette Espy, MD;  [facility];  Service: Endoscopy;;   POLYPECTOMY  01/10/2023   Procedure: POLYPECTOMY;  Surgeon: Brice Campi Albino Alu., MD;  Location: [facility];  Service: Gastroenterology;;   PROSTATECTOMY     RIGHT/LEFT HEART CATH AND CORONARY ANGIOGRAPHY N/A 01/27/2024   Procedure: RIGHT/LEFT HEART CATH AND CORONARY ANGIOGRAPHY;  Surgeon: Lauralee Poll, MD;  Location: [facility] INVASIVE CV LAB;  Service: Cardiovascular;   Laterality: N/A;   SHOULDER OPEN ROTATOR CUFF REPAIR Right X 2   SUBMUCOSAL TATTOO INJECTION  05/30/2022   Procedure: SUBMUCOSAL TATTOO INJECTION;  Surgeon: Suzette Espy, MD;  Location: [facility];  Service: Endoscopy;;   SUBMUCOSAL TATTOO INJECTION  01/10/2023   Procedure: SUBMUCOSAL TATTOO INJECTION;  Surgeon: Normie Becton., MD;  Location: [facility];  Service: Gastroenterology;;   TEE WITHOUT CARDIOVERSION N/A 09/05/2015   Procedure: TRANSESOPHAGEAL ECHOCARDIOGRAM (TEE);  Surgeon: Jacqueline Matsu, MD;  Location: [facility] ENDOSCOPY;  Service: Cardiovascular;  Laterality: N/A;   TEE WITHOUT CARDIOVERSION N/A 06/28/2022   Procedure: TRANSESOPHAGEAL ECHOCARDIOGRAM (TEE);  Surgeon: Arnoldo Lapping, MD;  Location: [facility] INVASIVE CV LAB;  Service: Cardiovascular;  Laterality: N/A;   FAMILY HISTORY Family History  Problem Relation Age of Onset   Heart disease Mother    Lung cancer Mother    Heart attack Mother 3   Diabetes Father    Heart disease Father    Stroke Brother    Healthy Daughter    Colon cancer Maternal Aunt        74s   SOCIAL HISTORY Social History   Tobacco Use   Smoking status: Former    Current packs/day: 0.00    Average packs/day: 2.0 packs/day for 27.0 years (54.0 ttl pk-yrs)    Types: Cigarettes    Start date: 33    Quit date: 1992    Years since quitting: 33.4    Passive exposure: Current   Smokeless tobacco: Never   Tobacco comments:    Former smoker 09/27/21  Vaping Use   Vaping status: Never Used  Substance Use Topics   Alcohol use: No    Alcohol/week: 0.0 standard drinks of alcohol    Comment: "used to drink; stopped ~ 2008"   Drug use: No       OPHTHALMIC EXAM: Base Eye Exam     Visual Acuity (Snellen - Linear)       Right Left   Dist cc 20/20 -2 20/25 -2    Correction: Glasses         Tonometry (Tonopen, 1:57 PM)       Right Left   Pressure 17 17         Pupils       Dark Light Shape React APD   Right 2 1 Round  Brisk None   Left 2 1 Round Brisk None         Visual Fields (Counting fingers)       Left Right    Full Full         Extraocular Movement       Right Left    Full, Ortho Full, Ortho         Neuro/Psych     Oriented x3: Yes   Mood/Affect: Normal         Dilation     Both eyes: 1.0% Mydriacyl, 2.5% Phenylephrine  @ 1:56 PM           Slit Lamp and Fundus Exam     Slit Lamp Exam       Right Left   Lids/Lashes Dermato, mild MGD Dermato, mild MGD   Conjunctiva/Sclera Temporal pinguecula, mild inferior sub conj heme Temporal pinguecula   Cornea EBMD, mild haze, trace PEE, mild Debris in tear film, well healed cataract wound trace haze, trace PEE, well healed temporal cataract wounds, arcus   Anterior Chamber Deep and clear; narrow temporal angle Deep and clear   Iris Round and dilated, mild anterior bowing, No NVI Round and moderately dilated to 5.51mm   Lens PCIOL in good position, trace PCO PC IOL in good position with open PC   Anterior Vitreous Synerisis Synerisis         Fundus Exam       Right Left   Disc Mild pallor, sharp rim, +cupping, thin inferior rim Mild pallor, sharp rim, +cupping, +PPA   C/D Ratio 0.7 0.7   Macula Flat, good foveal reflex, cystic changes - improved, RPE mottling and clumping,  no heme, Drusen, no exudates -- resolved Flat, blunted foveal reflex, trace cystic changes, punctate MA -- improved   Vessels attenuated, Tortuous attenuated, Tortuous   Periphery Attached, rare MA, focal DBH nasal to disc--improved Attached. No heme.           Refraction     Wearing Rx       Sphere Cylinder Axis Add   Right -0.25 +2.00 158 +2.50   Left -1.00 +1.25 008 +2.50           IMAGING AND PROCEDURES  Imaging and Procedures for 03/13/2024  OCT, Retina - OU - Both Eyes        Right Eye Quality was good. Central Foveal Thickness: 274. Progression has improved. Findings include no SRF, abnormal foveal contour, intraretinal fluid,  vitreomacular adhesion (Interval improvement in perifoveal IRF/IRHM -- almost resolved, partial PVD).   Left Eye Quality was good. Central Foveal Thickness: 276. Progression has been stable. Findings include normal foveal contour, no IRF, no SRF, intraretinal hyper-reflective material (persistent cystic changes and punctate IRHM inferior fovea -- slightly improved; partial PVD).   Notes  *Images captured and stored on drive  Diagnosis / Impression:  +DME OU OD: Interval improvement in perifoveal IRF/IRHM -- almost resolved, partial PVD OS: persistent cystic changes and punctate IRHM inferior fovea -- slightly improved; partial PVD  Clinical management:  See below  Abbreviations: NFP - Normal foveal profile. CME - cystoid macular edema. PED - pigment epithelial detachment. IRF - intraretinal fluid. SRF - subretinal fluid. EZ - ellipsoid zone. ERM - epiretinal membrane. ORA - outer retinal atrophy. ORT - outer retinal tubulation. SRHM - subretinal hyper-reflective material. IRHM - intraretinal hyper-reflective material     Intravitreal Injection, Pharmacologic Agent - OD - Right Eye        Time Out 03/13/2024. 2:41 PM. Confirmed correct patient, procedure, site, and patient consented.   Anesthesia Topical anesthesia was used. Anesthetic medications included Lidocaine  2%, Proparacaine 0.5%.   Procedure Preparation included 5% betadine to ocular surface, eyelid speculum. A (32g) needle was used.   Injection: 6 mg faricimab -svoa 6 MG/0.05ML (Patient supplied)   Route: Intravitreal, Site: Right Eye   NDC: 10960-454-09, Lot: [id number], Expiration date: 07/14/2025, Waste: 0 mL   Post-op Post injection exam found visual acuity of at least counting fingers. The patient tolerated the procedure well. There were no complications. The patient received written and verbal post procedure care education.   Notes  **SAMPLE MEDICATION ADMINISTERED**      Intravitreal Injection, Pharmacologic  Agent - OS - Left Eye        Time Out 03/13/2024. 2:42 PM. Confirmed correct patient, procedure, site, and patient consented.   Anesthesia Topical anesthesia was used. Anesthetic medications included Lidocaine  2%, Proparacaine 0.5%.   Procedure Preparation included 5% betadine to ocular surface, eyelid speculum. A (32g) needle was used.   Injection: 6 mg faricimab -svoa 6 MG/0.05ML (Patient supplied)   Route: Intravitreal, Site: Left Eye   NDC: 29562-130-86, Lot: [id number], Expiration date: 07/14/2025, Waste: 0 mL   Post-op Post injection exam found visual acuity of at least counting fingers. The patient tolerated the procedure well. There were no complications. The patient received written and verbal post procedure care education. Post injection medications were not given.   Notes  **SAMPLE MEDICATION ADMINISTERED**           ASSESSMENT/PLAN:   ICD-10-CM   1. 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, associated with type 2 diabetes mellitus (HCC)  E11.3213 OCT, Retina - OU - Both Eyes    Intravitreal Injection, Pharmacologic Agent - OD - Right Eye    Intravitreal Injection, Pharmacologic Agent - OS - Left Eye    faricimab -svoa (VABYSMO ) 6mg /0.73mL intravitreal injection    faricimab -svoa (VABYSMO ) 6mg /0.6mL intravitreal injection    2. Current use of insulin  (HCC)  Z79.4     3. Long term (current) use of oral hypoglycemic drugs  Z79.84     4. Essential hypertension  I10     5. Hypertensive retinopathy of both eyes  H35.033     6. Pseudophakia of both eyes  Z96.1     7. 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 History of herpes zoster of eye  Z86.19     9. Primary open angle glaucoma of both eyes, unspecified glaucoma stage  H40.1130      1-3. 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 Single focal MA superior to fovea; OS: Perifoveal Mas w/ late  leakage - s/p IVA OS # 1(07.26.21), #2 (08.24.21), #3 (09.21.21), #4 (10.22.21) -- IVA resistance ======================================================== - s/p IVE OD #1 (09.29.23), #2 (10.30.23), #3 (SAMPLE) (11.27.23) #4 (12.29.23) #5 (01.26.24) #6 (02.23.24) -- IVE resistance - s/p IVE OS #1 (11.22.21), #2 (12.21.21), #3 (01.20.22), #4 (02.18.22), #5 (03.18.22), #6 (04.15.22), #7 (05.10.22) -- sample, #8 (06.08.22), #9 (07.12.22), #10 (08.12.22), #11 (09.16.22), #12 (10.21.22), #13 (11.18.22), #14 (12.21.22), #15 (01.20.23), #16 (02.17.23), #17 (03.24.23), #18 (04.21.23), #19 (05.26.23), #20 (06.23.23) -- IVE resistance ======================================================== - s/p IVV OD #1 (03.22.24), #2 (04.19.24), #3 (05.17.24), #4 (06.18.24), #5 (07.19.24), #6 (08.23.24), #7 (09.27.24), #8 (11.01.24), #9 (12.06.24), #10 (01.10.25 -- sample), #11 (02.14.25 -- sample) #12 (03.21.25 -- sample), #13 (04.25.25 -- sample) - s/p IVV OS #1 (07.21.23), #2 (08.25.23), #3 (09.29.23), #4 (10.30.23), #5 (11.27.23) #6 (12.29.23), #7 (01.26.23), #8 (02.23.24), #9 (03.22.24), #10 (04.19.24), #11 (05.17.24), #12 (12.06.24), #13 (02.14.25 -- sample), #14 (03.21.25 -- sample),  #15 (04.25.25 -- sample) ======================================================== - BCVA OD: 20/20 -- stable; OS 20/30 from 20/25 - OCT shows [id number] improvement in perifoveal IRF/IRHM -- almost resolved,  partial PVD; OS: persistent cystic changes and punctate IRHM inferior fovea -- slightly improved; partial PVD at 5 weeks - recommend IVV OD #14 and IVV OS #16 [both samples] today, 05.30 25 with f/u in 5-6 weeks [[employer] funding unavailable] - pt wishes to proceed with injections OU  - RBA of procedure discussed, questions answered  - Vabysmo  informed consent obtained and signed, 02.14.25 (OU) - [employer] approved Vabysmo , no good days for 2025 as of now - see procedure note - f/u 5-6 weeks -- DFE/OCT, possible injection(s)  4,5.  Hypertensive retinopathy OU - discussed importance of tight BP control - continue to monitor  6. Pseudophakia OU  - s/p CE/IOL OS (2020, Dr. Reyne Cave)             - s/p CE/IOL OD (2022, Dr. Reyne Cave)  - s/p YAG cap OS (04.04.23) - BCVA 20/30 OD and 20/25 OS from 20/150  - IOLs in good position, doing well  - continue to monitor  7. 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 Lunda Salines in August 2022  - s/p SuperK OS on 12.12.2022  8. History of herpes zoster/iritis OS  - on Valtrex  1 g daily---maintenance  9. POAG OU  - was under the expert management of Dr. Reyne Cave, now follows with Dr. Carloyn Chi  - IOP 17  OU  - continue Cosopt  bid OU  Ophthalmic Meds Ordered this visit:  Meds ordered this encounter  Medications   faricimab -svoa (VABYSMO ) 6mg /0.51mL intravitreal injection   faricimab -svoa (VABYSMO ) 6mg /0.5mL intravitreal injection     This document serves as a record of services personally performed by Jeanice Millard, MD, PhD. It was created on their behalf by Eller Gut COT, an ophthalmic technician. The creation of this record is the provider's dictation and/or activities during the visit.    Electronically signed by: Eller Gut COT 05.29.25 4:05 PM  This document serves as a record of services personally performed by Jeanice Millard, MD, PhD. It was created on their behalf by Morley Arabia. Bevin Bucks, OA an ophthalmic technician. The creation of this record is the provider's dictation and/or activities during the visit.    Electronically signed by: Morley Arabia. Bevin Bucks, OA 03/13/24 4:05 PM  Jeanice Millard, M.D., Ph.D. Diseases & Surgery of the Retina and Vitreous [facility] 03/13/2024   I have reviewed the above documentation for accuracy and completeness, and I agree with the above. Jeanice Millard, M.D., Ph.D. 03/13/24 4:06 PM   Abbreviations: M myopia (nearsighted); A astigmatism; H hyperopia (farsighted); P presbyopia; Mrx spectacle  prescription;  CTL contact lenses; OD right eye; OS left eye; OU both eyes  XT exotropia; ET esotropia; PEK punctate epithelial keratitis; PEE punctate epithelial erosions; DES dry eye syndrome; MGD meibomian gland dysfunction; ATs artificial tears; PFAT's preservative free artificial tears; NSC nuclear sclerotic cataract; PSC posterior subcapsular cataract; ERM epi-retinal membrane; PVD posterior vitreous detachment; RD retinal detachment; DM diabetes mellitus; DR diabetic retinopathy; NPDR non-proliferative diabetic retinopathy; PDR proliferative diabetic retinopathy; CSME clinically significant macular edema; DME diabetic macular edema; dbh dot blot hemorrhages; CWS cotton wool spot; POAG primary open angle glaucoma; C/D cup-to-disc ratio; HVF humphrey visual field; GVF goldmann visual field; OCT optical coherence tomography; IOP intraocular pressure; BRVO Branch retinal vein occlusion; CRVO central retinal vein occlusion; CRAO central retinal artery occlusion; BRAO branch retinal artery occlusion; RT retinal tear; SB scleral buckle; PPV pars plana vitrectomy; VH Vitreous hemorrhage; PRP panretinal laser photocoagulation; IVK intravitreal kenalog; VMT vitreomacular traction; MH Macular hole;  NVD neovascularization of the disc; NVE neovascularization elsewhere; AREDS age related eye disease study; ARMD age related macular degeneration; POAG primary open angle glaucoma; EBMD epithelial/anterior basement membrane dystrophy; ACIOL anterior chamber intraocular lens; IOL intraocular lens; PCIOL posterior chamber intraocular lens; Phaco/IOL phacoemulsification with intraocular lens placement; PRK photorefractive keratectomy; LASIK laser assisted in situ keratomileusis; HTN hypertension; DM diabetes mellitus; COPD chronic obstructive pulmonary disease

## 2024-03-13 ENCOUNTER — Ambulatory Visit (INDEPENDENT_AMBULATORY_CARE_PROVIDER_SITE_OTHER): Admitting: Ophthalmology

## 2024-03-13 ENCOUNTER — Encounter (INDEPENDENT_AMBULATORY_CARE_PROVIDER_SITE_OTHER): Payer: Self-pay | Admitting: Ophthalmology

## 2024-03-13 DIAGNOSIS — Z794 Long term (current) use of insulin: Secondary | ICD-10-CM | POA: Diagnosis not present

## 2024-03-13 DIAGNOSIS — E113213 Type 2 diabetes mellitus with mild nonproliferative diabetic retinopathy with macular edema, bilateral: Secondary | ICD-10-CM

## 2024-03-13 DIAGNOSIS — I10 Essential (primary) hypertension: Secondary | ICD-10-CM | POA: Diagnosis not present

## 2024-03-13 DIAGNOSIS — Z7984 Long term (current) use of oral hypoglycemic drugs: Secondary | ICD-10-CM | POA: Diagnosis not present

## 2024-03-13 DIAGNOSIS — H401130 Primary open-angle glaucoma, bilateral, stage unspecified: Secondary | ICD-10-CM

## 2024-03-13 DIAGNOSIS — Z961 Presence of intraocular lens: Secondary | ICD-10-CM

## 2024-03-13 DIAGNOSIS — Z8619 Personal history of other infectious and parasitic diseases: Secondary | ICD-10-CM

## 2024-03-13 DIAGNOSIS — H18523 Epithelial (juvenile) corneal dystrophy, bilateral: Secondary | ICD-10-CM

## 2024-03-13 DIAGNOSIS — H35033 Hypertensive retinopathy, bilateral: Secondary | ICD-10-CM

## 2024-03-13 MED ORDER — FARICIMAB-SVOA 6 MG/0.05ML IZ SOLN
6.0000 mg | INTRAVITREAL | Status: AC | PRN
  Administered 2024-03-13: 6 mg via INTRAVITREAL

## 2024-03-16 ENCOUNTER — Inpatient Hospital Stay: Attending: Hematology

## 2024-03-16 VITALS — BP 164/82 | HR 68 | Temp 98.2°F | Resp 20 | Wt 229.0 lb

## 2024-03-16 DIAGNOSIS — Z79899 Other long term (current) drug therapy: Secondary | ICD-10-CM | POA: Insufficient documentation

## 2024-03-16 DIAGNOSIS — K922 Gastrointestinal hemorrhage, unspecified: Secondary | ICD-10-CM | POA: Diagnosis not present

## 2024-03-16 DIAGNOSIS — E538 Deficiency of other specified B group vitamins: Secondary | ICD-10-CM | POA: Diagnosis not present

## 2024-03-16 DIAGNOSIS — D500 Iron deficiency anemia secondary to blood loss (chronic): Secondary | ICD-10-CM | POA: Diagnosis present

## 2024-03-16 MED ORDER — SODIUM CHLORIDE 0.9 % IV SOLN
510.0000 mg | Freq: Once | INTRAVENOUS | Status: AC
  Administered 2024-03-16: 510 mg via INTRAVENOUS
  Filled 2024-03-16: qty 510

## 2024-03-16 MED ORDER — SODIUM CHLORIDE 0.9 % IV SOLN: INTRAVENOUS | Status: DC

## 2024-03-16 NOTE — Progress Notes (Signed)
 Patient took pre medications at home.     Feraheme iron  infusion  given per orders. Patient tolerated it well without problems. Vitals stable and discharged home from clinic ambulatory. Follow up as scheduled.

## 2024-03-16 NOTE — Patient Instructions (Signed)
 [facility] [place] - A DEPT OF [facility]  Discharge Instructions: Thank you for choosing [facility] to provide your oncology and hematology care.  If you have a lab appointment with the Cancer Center - please note that after April 8th, 2024, all labs will be drawn in the cancer center.  You do not have to check in or register with the main entrance as you have in the past but will complete your check-in in the cancer center.  Wear comfortable clothing and clothing appropriate for easy access to any Portacath or PICC line.   We strive to give you quality time with your provider. You may need to reschedule your appointment if you arrive late (15 or more minutes).  Arriving late affects you and other patients whose appointments are after yours.  Also, if you miss three or more appointments without notifying the office, you may be dismissed from the clinic at the provider's discretion.      For prescription refill requests, have your pharmacy contact our office and allow 72 hours for refills to be completed.    Today you received the following iron infusion: Feraheme   To help prevent nausea and vomiting after your treatment, we encourage you to take your nausea medication as directed.  BELOW ARE SYMPTOMS THAT SHOULD BE REPORTED IMMEDIATELY: *FEVER GREATER THAN 100.4 F (38 C) OR HIGHER *CHILLS OR SWEATING *NAUSEA AND VOMITING THAT IS NOT CONTROLLED WITH YOUR NAUSEA MEDICATION *UNUSUAL SHORTNESS OF BREATH *UNUSUAL BRUISING OR BLEEDING *URINARY PROBLEMS (pain or burning when urinating, or frequent urination) *BOWEL PROBLEMS (unusual diarrhea, constipation, pain near the anus) TENDERNESS IN MOUTH AND THROAT WITH OR WITHOUT PRESENCE OF ULCERS (sore throat, sores in mouth, or a toothache) UNUSUAL RASH, SWELLING OR PAIN  UNUSUAL VAGINAL DISCHARGE OR ITCHING   Items with * indicate a potential emergency and should be followed up as soon as possible or go to  the Emergency Department if any problems should occur.  Please show the CHEMOTHERAPY ALERT CARD or IMMUNOTHERAPY ALERT CARD at check-in to the Emergency Department and triage nurse.  Should you have questions after your visit or need to cancel or reschedule your appointment, please contact [facility] [place] - A DEPT OF Eligha Brideg[facility] [phone]  and follow the prompts.  Office hours are 8:00 a.m. to 4:30 p.m. Monday - Friday. Please note that voicemails left after 4:00 p.m. may not be returned until the following business day.  We are closed weekends and major holidays. You have access to a nurse at all times for urgent questions. Please call the main number to the clinic [phone] and follow the prompts.  For any non-urgent questions, you may also contact your provider using MyChart. We now offer e-Visits for anyone 39 and older to request care online for non-urgent symptoms. For details visit [email or web address].   Also download the MyChart app! Go to the app store, search "MyChart", open the app, select [facility], and log in with your MyChart username and password.

## 2024-03-18 ENCOUNTER — Ambulatory Visit: Admitting: Cardiovascular Disease

## 2024-03-20 ENCOUNTER — Encounter: Payer: Self-pay | Admitting: Family Medicine

## 2024-03-20 ENCOUNTER — Ambulatory Visit (INDEPENDENT_AMBULATORY_CARE_PROVIDER_SITE_OTHER): Admitting: Family Medicine

## 2024-03-20 VITALS — BP 153/79 | HR 68 | Resp 16 | Ht 71.0 in | Wt 229.1 lb

## 2024-03-20 DIAGNOSIS — I10 Essential (primary) hypertension: Secondary | ICD-10-CM

## 2024-03-20 DIAGNOSIS — M25572 Pain in left ankle and joints of left foot: Secondary | ICD-10-CM

## 2024-03-20 DIAGNOSIS — E1169 Type 2 diabetes mellitus with other specified complication: Secondary | ICD-10-CM

## 2024-03-20 DIAGNOSIS — J029 Acute pharyngitis, unspecified: Secondary | ICD-10-CM | POA: Diagnosis not present

## 2024-03-20 DIAGNOSIS — Z794 Long term (current) use of insulin: Secondary | ICD-10-CM

## 2024-03-20 MED ORDER — PREDNISONE 5 MG PO TABS: ORAL_TABLET | ORAL | 0 refills | Status: DC

## 2024-03-20 MED ORDER — PENICILLIN V POTASSIUM 500 MG PO TABS
500.0000 mg | ORAL_TABLET | Freq: Three times a day (TID) | ORAL | 0 refills | Status: AC
Start: 2024-03-20 — End: 2024-03-30

## 2024-03-20 NOTE — Patient Instructions (Signed)
 F/U as before with pCP  You are treated today for sore throat with 1 week of penicillin, take entire course please  You are treated for left ankle pain with prednisone  for 5 days only, one tablet once daily  Thanks for choosing [facility], we consider it a privelige to serve you.

## 2024-03-21 ENCOUNTER — Encounter: Payer: Self-pay | Admitting: Family Medicine

## 2024-03-21 NOTE — Assessment & Plan Note (Signed)
 Controlled.

## 2024-03-21 NOTE — Assessment & Plan Note (Signed)
 5 day course of low dose prednisone  prescribed

## 2024-03-21 NOTE — Progress Notes (Signed)
   Lucas Dudley     MRN: [id number]      DOB: Mar 11, 1941  Chief Complaint  Patient presents with   Sore Throat    Pt complains of throat and tongue pain x3 days. At first pt thought he burnt his throat with a cup of coffee but has not gotten better.    Ankle Pain    Pt complains of left ankle pain x1 week. No known injury. Has been using compression sock to maintain swelling     HPI Lucas Dudley is here for an acute visit with above  stated concerns Had a few pencillin tabs left over at home which he took and adforded some relief N/o trauma to ankle, has h/o gout reportedly   ROS Denies recent fever always " feels cold"c/o sore throat. Denies chest congestion, productive cough or wheezing. Denies chest pains, palpitations and leg swelling Denies abdominal pain, nausea, vomiting,diarrhea or constipation.   .   PE  BP (!) 153/79   Pulse 68   Resp 16   Ht 5\' 11"  (1.803 m)   Wt 229 lb 1.9 oz (103.9 kg)   SpO2 92%   BMI 31.96 kg/m   Patient alert and oriented and in no cardiopulmonary distress.  HEENT: No facial asymmetry, EOMI,     Neck supple .erythema of oropharynx no exudate, bilateral cervical adenitis, right greater than left  Chest: decreased air entry , no wheezes .  CVS: S1, S2 no murmurs,  Ext: No edema  MS: decreased  ROM spine, shoulders, hips and knees.left ankle swollen  and tender   Skin: Intact, no ulcerations or rash noted.  Psych: Good eye contact, normal affect. Memory intact not anxious or depressed appearing.  CNS: CN 2-12 intact, power,  normal throughout.no focal deficits noted.   Assessment & Plan  Acute left ankle pain 5 day course of low dose prednisone  prescribed  Type 2 diabetes mellitus with other specified complication (HCC) Controlled   Essential hypertension Elevated at visit, will f/u with PCP and Cardiology

## 2024-03-21 NOTE — Assessment & Plan Note (Signed)
 Elevated at visit, will f/u with PCP and Cardiology

## 2024-04-06 ENCOUNTER — Encounter: Payer: Self-pay | Admitting: Cardiology

## 2024-04-06 ENCOUNTER — Ambulatory Visit: Attending: Cardiology | Admitting: Cardiology

## 2024-04-06 VITALS — BP 136/68 | HR 66 | Ht 71.0 in | Wt 225.4 lb

## 2024-04-06 DIAGNOSIS — I5032 Chronic diastolic (congestive) heart failure: Secondary | ICD-10-CM | POA: Diagnosis not present

## 2024-04-06 DIAGNOSIS — I4819 Other persistent atrial fibrillation: Secondary | ICD-10-CM

## 2024-04-06 DIAGNOSIS — G4733 Obstructive sleep apnea (adult) (pediatric): Secondary | ICD-10-CM | POA: Diagnosis not present

## 2024-04-06 DIAGNOSIS — I10 Essential (primary) hypertension: Secondary | ICD-10-CM

## 2024-04-06 DIAGNOSIS — I272 Pulmonary hypertension, unspecified: Secondary | ICD-10-CM

## 2024-04-06 DIAGNOSIS — I2510 Atherosclerotic heart disease of native coronary artery without angina pectoris: Secondary | ICD-10-CM

## 2024-04-06 DIAGNOSIS — E785 Hyperlipidemia, unspecified: Secondary | ICD-10-CM

## 2024-04-06 NOTE — Patient Instructions (Signed)
 Medication Instructions:  Your physician recommends that you continue on your current medications as directed. Please refer to the Current Medication list given to you today.  *If you need a refill on your cardiac medications before your next appointment, please call your pharmacy*  Lab Work: None.  If you have labs (blood work) drawn today and your tests are completely normal, you will receive your results only by: MyChart Message (if you have MyChart) OR A paper copy in the mail If you have any lab test that is abnormal or we need to change your treatment, we will call you to review the results.  Testing/Procedures: None.  Follow-Up: At [facility], you and your health needs are our priority.  As part of our continuing mission to provide you with exceptional heart care, our providers are all part of one team.  This team includes your primary Cardiologist (physician) and Advanced Practice Providers or APPs (Physician Assistants and Nurse Practitioners) who all work together to provide you with the care you need, when you need it.  Your next appointment:   1 year(s)  Provider:   Dr. Wilbert Bihari, MD   We recommend signing up for the patient portal called MyChart.  Sign up information is provided on this After Visit Summary.  MyChart is used to connect with patients for Virtual Visits (Telemedicine).  Patients are able to view lab/test results, encounter notes, upcoming appointments, etc.  Non-urgent messages can be sent to your provider as well.   To learn more about what you can do with MyChart, go to [email or web address].   Other Instructions Dr. Bihari has ordered new cpap supplies for you. Please contact our office if you do not hear anything regarding delivery or pick up of your new supplies within 2 weeks.

## 2024-04-06 NOTE — Progress Notes (Signed)
 Sleep Medicine CONSULT Note    Date:  04/06/2024   ID:  Lucas Dudley, DOB [birth year], MRN [id number]  PCP:  [name], MD  Cardiologist: [name], MD   Chief Complaint  Patient presents with   New Patient (Initial Visit)    Obstructive sleep apnea    History of Present Illness:  Lucas Dudley is a 83 y.o. male who is being seen today for the evaluation of obstructive sleep apnea at the request of [name], MD.  This is an 83 year old male with a history of atrial fibrillation, CAD, chronic diastolic CHF, hypertension, hyperlipidemia, nonischemic cardiomyopathy, diabetes and obstructive sleep apnea on CPAP.  He was remotely followed by Dr. Burnard for his sleep apnea but then was lost to follow-up.  He underwent a split-night sleep study 07/05/2015 showing severe obstructive sleep apnea with an AHI of 71.5/h and AHI of 72/h during REM sleep.  O2 saturations were 83%.  He underwent CPAP titration was started on auto CPAP from 8 to 20 cm H2O.  At his last office visit in 2021 he was having a lot of problems with his CPAP machine needing multiple repairs.  His DME then went out of business.  He was changed to [facility] for his DME and given a ResMed AirFit N30 I mask.  A new ResMed air sense 10 CPAP auto unit was ordered.  He failed to follow-up after that.  He is now referred for sleep consultation for treatment of obstructive sleep apnea  He is doing well with his PAP device but needs new supplies.  He tolerates the nasal pillow mask and feels the pressure is adequate. For the most part he feels rested in the am and has no significant daytime sleepiness but sometimes will fall asleep sitting in the chair watching TV. He has significant problems with mouth dryness but does not like using a chin strap.  He denies any significant nasal dryness or nasal congestion.  He does not think that he snores.     Past Medical History:  Diagnosis Date   Arthritis    Atrial  fibrillation (HCC)    CAD (coronary artery disease)    a. Cath 03/17/15 showing 100% ostial D1, 50% prox LAD to mid LAD, 40% RPDA stenosis. Med rx. // Myoview  01/2020: EF 31 diffuse perfusion defect without reversibility (suspect artifact); reviewed with Dr. Suellen study felt to be low risk   Cataract    Mixed form OD   Chronic diastolic CHF (congestive heart failure) (HCC)    Chronically elevated hemidiaphragm - Right Side    Diabetic retinopathy (HCC)    NPDR OU   Essential hypertension    Glaucoma    POAG OU   History of gout    Hyperlipidemia    Hypertensive retinopathy    OU   Kidney stones    Melanoma of neck (HCC)    NICM (nonischemic cardiomyopathy) (HCC)    OSA on CPAP 2012   Prostate cancer (HCC)    Type II diabetes mellitus (HCC)     Past Surgical History:  Procedure Laterality Date   ABDOMINAL HERNIA REPAIR     w/mesh   ATRIAL FIBRILLATION ABLATION N/A 06/06/2021   Procedure: ATRIAL FIBRILLATION ABLATION;  Surgeon: Kelsie Agent, MD;  [facility]: [facility];  Service: Cardiovascular;  Laterality: N/A;   BIOPSY  05/24/2022   Procedure: BIOPSY;  Surgeon: Shaaron Lamar HERO, MD;  [facility]: [facility];  Service: Endoscopy;;  BIOPSY  05/30/2022   Procedure: BIOPSY;  Surgeon: Shaaron Lamar HERO, MD;  [facility];  Service: Endoscopy;;   BIOPSY  01/10/2023   Procedure: BIOPSY;  Surgeon: Wilhelmenia Aloha Raddle., MD;  Location: [facility];  Service: Gastroenterology;;   CARDIAC CATHETERIZATION N/A 03/17/2015   Procedure: Left Heart Cath and Coronary Angiography;  Surgeon: Dorn JINNY Lesches, MD;  Location: [facility] INVASIVE CV LAB;  Service: Cardiovascular;  Laterality: N/A;   CARDIOVERSION N/A 08/09/2021   Procedure: CARDIOVERSION;  Surgeon: Raford Riggs, MD;  Location: [facility] ENDOSCOPY;  Service: Cardiovascular;  Laterality: N/A;   CARDIOVERSION N/A 11/10/2021   Procedure: CARDIOVERSION;  Surgeon: Sheena Pugh, DO;  Location: [facility];  Service:  Cardiovascular;  Laterality: N/A;   carotid doppler  09/17/2008   rigt and left ICAs 0-49%;mildly  abnormal   CATARACT EXTRACTION Left 2020   Dr. Lelon   COLONOSCOPY N/A 05/16/2017   Procedure: COLONOSCOPY;  Surgeon: Golda Claudis PENNER, MD;  [facility];  Service: Endoscopy;  Laterality: N/A;  930   COLONOSCOPY WITH PROPOFOL  N/A 05/24/2022   Procedure: COLONOSCOPY WITH PROPOFOL ;  Surgeon: Shaaron Lamar HERO, MD;  [facility];  Service: Endoscopy;  Laterality: N/A;   DOPPLER ECHOCARDIOGRAPHY  05/25/2009   EF 50-55%,LA mildly dilated, LV function normal   ELECTROPHYSIOLOGIC STUDY N/A 04/05/2015   Procedure: Cardioversion;  Surgeon: Jerel Balding, MD;  Location: [facility];  Service: Cardiovascular;  Laterality: N/A;   ELECTROPHYSIOLOGIC STUDY N/A 09/06/2015   Procedure: Atrial Fibrillation Ablation;  Surgeon: [name], MD;  Location: [facility] INVASIVE CV LAB;  Service: Cardiovascular;  Laterality: N/A;   ELECTROPHYSIOLOGIC STUDY N/A 07/12/2016   redo afib ablation by Dr Rakers   ENTEROSCOPY N/A 05/30/2022   Procedure: ENTEROSCOPY;  Surgeon: Shaaron Lamar HERO, MD;  [facility];  Service: Endoscopy;  Laterality: N/A;   ENTEROSCOPY N/A 01/07/2024   Procedure: ENTEROSCOPY;  Surgeon: Eartha Angelia Sieving, MD;  [facility];  Service: Gastroenterology;  Laterality: N/A;  1:45PM;ASA 3   ESOPHAGOGASTRODUODENOSCOPY (EGD) WITH PROPOFOL  N/A 05/24/2022   Procedure: ESOPHAGOGASTRODUODENOSCOPY (EGD) WITH PROPOFOL ;  Surgeon: Shaaron Lamar HERO, MD;  [facility];  Service: Endoscopy;  Laterality: N/A;   ESOPHAGOGASTRODUODENOSCOPY (EGD) WITH PROPOFOL  N/A 01/10/2023   Procedure: ESOPHAGOGASTRODUODENOSCOPY (EGD) WITH PROPOFOL ;  Surgeon: Wilhelmenia Aloha Raddle., MD;  Location: [facility];  Service: Gastroenterology;  Laterality: N/A;   EUS N/A 01/10/2023   Procedure: UPPER ENDOSCOPIC ULTRASOUND (EUS) RADIAL;  Surgeon: Wilhelmenia Aloha Raddle., MD;  Location:  [facility]OSCOPY;  Service: Gastroenterology;  Laterality: N/A;   EXCISIONAL HEMORRHOIDECTOMY     inside and out   EYE SURGERY Left 2020   Cat Sx - Dr. Lelon   FINE NEEDLE ASPIRATION Right    knee; drew ~ 1 quart off   GIVENS CAPSULE STUDY N/A 05/25/2022   Procedure: GIVENS CAPSULE STUDY;  Surgeon: Cindie Carlin POUR, DO;  [facility];  Service: Endoscopy;  Laterality: N/A;   GIVENS CAPSULE STUDY N/A 01/23/2023   Procedure: GIVENS CAPSULE STUDY;  Surgeon: Eartha Angelia Sieving, MD;  [facility];  Service: Gastroenterology;  Laterality: N/A;  8:30am   HERNIA REPAIR     HOT HEMOSTASIS N/A 01/10/2023   Procedure: HOT HEMOSTASIS (ARGON PLASMA COAGULATION/BICAP);  Surgeon: Wilhelmenia Aloha Raddle., MD;  Location: [facility];  Service: Gastroenterology;  Laterality: N/A;   HOT HEMOSTASIS  01/07/2024   Procedure: EGD, WITH ARGON PLASMA COAGULATION;  Surgeon: Eartha Angelia Sieving, MD;  [facility];  Service: Gastroenterology;;   LAPAROSCOPIC CHOLECYSTECTOMY     LEFT ATRIAL APPENDAGE OCCLUSION N/A 06/28/2022   Procedure: LEFT ATRIAL APPENDAGE OCCLUSION;  Surgeon: Wonda Sharper, MD;  Location: [facility] INVASIVE CV LAB;  Service: Cardiovascular;  Laterality: N/A;   MELANOMA EXCISION Right    neck   NM MYOCAR PERF WALL MOTION  02/21/2012   EF 61% ,EXERCISE 7 METS. exercise stopped due to wheezing and shortness of breathe   POLYPECTOMY  05/16/2017   Procedure: POLYPECTOMY;  Surgeon: Golda Claudis PENNER, MD;  [facility];  Service: Endoscopy;;  colon   POLYPECTOMY  05/24/2022   Procedure: POLYPECTOMY;  Surgeon: Shaaron Lamar HERO, MD;  [facility];  Service: Endoscopy;;   POLYPECTOMY  01/10/2023   Procedure: POLYPECTOMY;  Surgeon: Wilhelmenia Aloha Raddle., MD;  Location: [facility];  Service: Gastroenterology;;   PROSTATECTOMY     RIGHT/LEFT HEART CATH AND CORONARY ANGIOGRAPHY N/A 01/27/2024   Procedure: RIGHT/LEFT HEART CATH AND CORONARY  ANGIOGRAPHY;  Surgeon: Zenaida Morene PARAS, MD;  Location: [facility] INVASIVE CV LAB;  Service: Cardiovascular;  Laterality: N/A;   SHOULDER OPEN ROTATOR CUFF REPAIR Right X 2   SUBMUCOSAL TATTOO INJECTION  05/30/2022   Procedure: SUBMUCOSAL TATTOO INJECTION;  Surgeon: Shaaron Lamar HERO, MD;  [facility];  Service: Endoscopy;;   SUBMUCOSAL TATTOO INJECTION  01/10/2023   Procedure: SUBMUCOSAL TATTOO INJECTION;  Surgeon: Wilhelmenia Aloha Raddle., MD;  Location: [facility];  Service: Gastroenterology;;   TEE WITHOUT CARDIOVERSION N/A 09/05/2015   Procedure: TRANSESOPHAGEAL ECHOCARDIOGRAM (TEE);  Surgeon: Wilbert JONELLE Bihari, MD;  Location: [facility] ENDOSCOPY;  Service: Cardiovascular;  Laterality: N/A;   TEE WITHOUT CARDIOVERSION N/A 06/28/2022   Procedure: TRANSESOPHAGEAL ECHOCARDIOGRAM (TEE);  Surgeon: Wonda Sharper, MD;  Location: [facility] INVASIVE CV LAB;  Service: Cardiovascular;  Laterality: N/A;    Current Medications: Current Meds  Medication Sig   albuterol  (VENTOLIN  HFA) 108 (90 Base) MCG/ACT inhaler Inhale 2 puffs into the lungs every 6 (six) hours as needed for wheezing or shortness of breath.   ammonium lactate  (AMLACTIN DAILY) 12 % lotion Apply 1 Application topically as needed.   aspirin  81 MG chewable tablet Chew 81 mg by mouth daily.   camphor-menthol (ANTI-ITCH) lotion Apply 1 Application topically daily as needed for itching.   COMBIGAN  0.2-0.5 % ophthalmic solution Place 1 drop into both eyes 2 (two) times daily.   dapagliflozin  propanediol (FARXIGA ) 10 MG TABS tablet Take 1 tablet (10 mg total) by mouth daily.   diphenhydramine -acetaminophen  (TYLENOL  PM) 25-500 MG TABS tablet Take 1 tablet by mouth at bedtime.   ferrous sulfate  325 (65 FE) MG tablet Take 1 tablet (325 mg total) by mouth daily with breakfast.   fluticasone  (FLONASE ) 50 MCG/ACT nasal spray SPRAY 2 SPRAYS INTO EACH NOSTRIL EVERY DAY (Patient taking differently: Place 2 sprays into both nostrils daily.)   gemfibrozil  (LOPID )  600 MG tablet Take 600 mg by mouth at bedtime.    glimepiride  (AMARYL ) 2 MG tablet Take 1 tablet (2 mg total) by mouth daily with breakfast. (Patient taking differently: Take 2 mg by mouth at bedtime.)   Insulin  Glargine (BASAGLAR  KWIKPEN) 100 UNIT/ML Inject 45 Units into the skin at bedtime. (Patient taking differently: Inject 32 Units into the skin at bedtime.)   pantoprazole  (PROTONIX ) 40 MG tablet TAKE 1 TABLET BY MOUTH EVERY DAY   predniSONE  (DELTASONE ) 5 MG tablet Take one tablet by mouth once daoily for 5 days only   torsemide  (DEMADEX ) 20 MG tablet  Take 1 tablet (20 mg total) by mouth 2 (two) times daily.    Allergies:   Azithromycin  and Tape   Social History   Socioeconomic History   Marital status: Widowed    Spouse name: Not on file   Number of children: 1   Years of education: Not on file   Highest education level: GED or equivalent  Occupational History   Occupation: Retired  Tobacco Use   Smoking status: Former    Current packs/day: 0.00    Average packs/day: 2.0 packs/day for 27.0 years (54.0 ttl pk-yrs)    Types: Cigarettes    Start date: 76    Quit date: 1992    Years since quitting: 33.4    Passive exposure: Current   Smokeless tobacco: Never   Tobacco comments:    Former smoker 09/27/21  Vaping Use   Vaping status: Never Used  Substance and Sexual Activity   Alcohol use: No    Alcohol/week: 0.0 standard drinks of alcohol    Comment: used to drink; stopped ~ 2008   Drug use: No   Sexual activity: Not Currently  Other Topics Concern   Not on file  Social History Narrative   Lives in [place], [place] with wife.   Social Drivers of Corporate investment banker Strain: Low Risk  (02/10/2024)   Overall Financial Resource Strain (CARDIA)    Difficulty of Paying Living Expenses: Not very hard  Food Insecurity: No Food Insecurity (02/10/2024)   Hunger Vital Sign    Worried About Running Out of Food in the Last Year: Never true    Ran Out of Food in the Last  Year: Never true  Transportation Needs: No Transportation Needs (02/10/2024)   PRAPARE - Administrator, Civil Service (Medical): No    Lack of Transportation (Non-Medical): No  Physical Activity: Inactive (02/10/2024)   Exercise Vital Sign    Days of Exercise per Week: 0 days    Minutes of Exercise per Session: 0 min  Stress: No Stress Concern Present (02/10/2024)   [employer] of Occupational Health - Occupational Stress Questionnaire    Feeling of Stress : Only a little  Social Connections: Moderately Integrated (02/10/2024)   Social Connection and Isolation Panel    Frequency of Communication with Friends and Family: More than three times a week    Frequency of Social Gatherings with Friends and Family: More than three times a week    Attends Religious Services: More than 4 times per year    Active Member of [employer] or Organizations: Yes    Attends Banker Meetings: More than 4 times per year    Marital Status: Widowed  Recent Concern: Social Connections - Moderately Isolated (11/20/2023)   Social Connection and Isolation Panel    Frequency of Communication with Friends and Family: More than three times a week    Frequency of Social Gatherings with Friends and Family: Once a week    Attends Religious Services: More than 4 times per year    Active Member of [employer] or Organizations: No    Attends Banker Meetings: Never    Marital Status: Widowed     Family History:  The patient's family history includes Colon cancer in his maternal aunt; Diabetes in his father; Healthy in his daughter; Heart attack (age of onset: 49) in his mother; Heart disease in his father and mother; Lung cancer in his mother; Stroke in his brother.   ROS:  Please see the history of present illness.    ROS All other systems reviewed and are negative.      No data to display             PHYSICAL EXAM:   VS:  BP 136/68   Pulse 66   Ht 5' 11 (1.803 m)   Wt 225  lb 6.4 oz (102.2 kg)   SpO2 99%   BMI 31.44 kg/m    GEN: Well nourished, well developed, in no acute distress  HEENT: normal  Neck: no JVD, carotid bruits, or masses Cardiac: RRR; no murmurs, rubs, or gallops,no edema.  Intact distal pulses bilaterally.  Respiratory:  clear to auscultation bilaterally, normal work of breathing GI: soft, nontender, nondistended, + BS MS: no deformity or atrophy  Skin: warm and dry, no rash Neuro:  Alert and Oriented x 3, Strength and sensation are intact Psych: euthymic mood, full affect  Wt Readings from Last 3 Encounters:  04/06/24 225 lb 6.4 oz (102.2 kg)  03/20/24 229 lb 1.9 oz (103.9 kg)  03/16/24 229 lb (103.9 kg)      Studies/Labs Reviewed:   Split-night sleep study, PAP compliance download  Recent Labs: 01/13/2024: ALT 20; B Natriuretic Peptide 327.0 01/23/2024: Magnesium  1.9 02/12/2024: BUN 28; Creatinine, Ser 1.57; Potassium 5.0; Sodium 132 03/10/2024: Hemoglobin 12.3; Platelets 334     ASSESSMENT:    1. OSA on CPAP   2. Essential hypertension, benign      PLAN:  In order of problems listed above:  #OSA - The patient is tolerating PAP therapy well without any problems. The PAP download performed by his DME was personally reviewed and interpreted by me today and showed an AHI of 2.3 /hr on auto CPAP from 8-20 cm H2O with 100% compliance in using more than 4 hours nightly.  The patient has been using and benefiting from PAP use and will continue to benefit from therapy.  -I will order new supplies and get him in with [facility] for his DME  #Hypertension - Diet controlled   Time Spent: 20 minutes total time of encounter, including 15 minutes spent in face-to-face patient care on the date of this encounter. This time includes coordination of care and counseling regarding above mentioned problem list. Remainder of non-face-to-face time involved reviewing chart documents/testing relevant to the patient encounter and documentation in the  medical record. I have independently reviewed documentation from referring provider  Medication Adjustments/Labs and Tests Ordered: Current medicines are reviewed at length with the patient today.  Concerns regarding medicines are outlined above.  Medication changes, Labs and Tests ordered today are listed in the Patient Instructions below.  There are no Patient Instructions on file for this visit.   Signed, Wilbert Bihari, MD  04/06/2024 3:06 PM    [facility] [address] [place], [place], [place]  [address] Phone: [phone]; Fax: [phone]

## 2024-04-07 ENCOUNTER — Encounter: Attending: Gastroenterology | Admitting: *Deleted

## 2024-04-07 VITALS — BP 125/62 | HR 65 | Temp 97.9°F | Resp 18

## 2024-04-07 DIAGNOSIS — K5520 Angiodysplasia of colon without hemorrhage: Secondary | ICD-10-CM | POA: Insufficient documentation

## 2024-04-07 MED ORDER — OCTREOTIDE ACETATE 20 MG IM KIT
20.0000 mg | PACK | Freq: Once | INTRAMUSCULAR | Status: AC
  Administered 2024-04-07: 20 mg via INTRAMUSCULAR

## 2024-04-07 NOTE — Progress Notes (Signed)
 Diagnosis: AVM of small bowel  Provider:  Samantha Cress MD  Procedure: Injection  Sandostatin , Dose: 20 mg, Site: intramuscular, Number of injections: 1  Injection Site(s): Right upper quad. gluteus  Post Care: Observation period completed  Discharge: Condition: Good, Destination: Home . AVS Provided  Performed by:  Verneda Golder, RN

## 2024-04-08 NOTE — Addendum Note (Signed)
 Addended by: BEVELY CONNELL BROCKS on: 04/08/2024 02:43 PM   Modules accepted: Orders

## 2024-04-09 ENCOUNTER — Ambulatory Visit: Admitting: Internal Medicine

## 2024-04-09 ENCOUNTER — Encounter: Payer: Self-pay | Admitting: Internal Medicine

## 2024-04-09 VITALS — BP 134/72 | HR 73 | Ht 71.0 in | Wt 229.2 lb

## 2024-04-09 DIAGNOSIS — N3000 Acute cystitis without hematuria: Secondary | ICD-10-CM | POA: Insufficient documentation

## 2024-04-09 MED ORDER — SULFAMETHOXAZOLE-TRIMETHOPRIM 800-160 MG PO TABS: 1.0000 | ORAL_TABLET | Freq: Two times a day (BID) | ORAL | 0 refills | Status: DC

## 2024-04-09 NOTE — Assessment & Plan Note (Signed)
 Considering his symptoms, started empiric Bactrim Check UA with reflex culture Maintain adequate hydration

## 2024-04-09 NOTE — Progress Notes (Signed)
 Acute Office Visit  Subjective:    Patient ID: Lucas Dudley, male    DOB: 01-11-1941, 83 y.o.   MRN: [id number]  Chief Complaint  Patient presents with   Urinary Tract Infection    Pt reports sx of uti    HPI Patient is in today for urinary frequency, dysuria and urgency for the last 5 days.  Denies any urethral discharge.  He reports history of UTI.  Of note, he takes Farxiga  for type II DM and CHF.  Past Medical History:  Diagnosis Date   Arthritis    Atrial fibrillation (HCC)    CAD (coronary artery disease)    a. Cath 03/17/15 showing 100% ostial D1, 50% prox LAD to mid LAD, 40% RPDA stenosis. Med rx. // Myoview  01/2020: EF 31 diffuse perfusion defect without reversibility (suspect artifact); reviewed with Dr. Suellen study felt to be low risk   Cataract    Mixed form OD   Chronic diastolic CHF (congestive heart failure) (HCC)    Chronically elevated hemidiaphragm - Right Side    Diabetic retinopathy (HCC)    NPDR OU   Essential hypertension    Glaucoma    POAG OU   History of gout    Hyperlipidemia    Hypertensive retinopathy    OU   Kidney stones    Melanoma of neck (HCC)    NICM (nonischemic cardiomyopathy) (HCC)    OSA on CPAP 2012   Prostate cancer (HCC)    Type II diabetes mellitus (HCC)     Past Surgical History:  Procedure Laterality Date   ABDOMINAL HERNIA REPAIR     w/mesh   ATRIAL FIBRILLATION ABLATION N/A 06/06/2021   Procedure: ATRIAL FIBRILLATION ABLATION;  Surgeon: Kelsie Agent, MD;  Location: [facility];  Service: Cardiovascular;  Laterality: N/A;   BIOPSY  05/24/2022   Procedure: BIOPSY;  Surgeon: Shaaron Lamar HERO, MD;  Location: [facility];  Service: Endoscopy;;   BIOPSY  05/30/2022   Procedure: BIOPSY;  Surgeon: Shaaron Lamar HERO, MD;  [facility];  Service: Endoscopy;;   BIOPSY  01/10/2023   Procedure: BIOPSY;  Surgeon: Wilhelmenia Aloha Raddle., MD;  Location: [facility];  Service: Gastroenterology;;   CARDIAC  CATHETERIZATION N/A 03/17/2015   Procedure: Left Heart Cath and Coronary Angiography;  Surgeon: Dorn JINNY Lesches, MD;  Location: [facility] INVASIVE CV LAB;  Service: Cardiovascular;  Laterality: N/A;   CARDIOVERSION N/A 08/09/2021   Procedure: CARDIOVERSION;  Surgeon: Raford Riggs, MD;  Location: [facility];  Service: Cardiovascular;  Laterality: N/A;   CARDIOVERSION N/A 11/10/2021   Procedure: CARDIOVERSION;  Surgeon: Sheena Pugh, DO;  Location: [facility];  Service: Cardiovascular;  Laterality: N/A;   carotid doppler  09/17/2008   rigt and left ICAs 0-49%;mildly  abnormal   CATARACT EXTRACTION Left 2020   Dr. Lelon   COLONOSCOPY N/A 05/16/2017   Procedure: COLONOSCOPY;  Surgeon: Golda Claudis PENNER, MD;  [facility]: [facility];  Service: Endoscopy;  Laterality: N/A;  930   COLONOSCOPY WITH PROPOFOL  N/A 05/24/2022   Procedure: COLONOSCOPY WITH PROPOFOL ;  Surgeon: Shaaron Lamar HERO, MD;  [facility];  Service: Endoscopy;  Laterality: N/A;   DOPPLER ECHOCARDIOGRAPHY  05/25/2009   EF 50-55%,LA mildly dilated, LV function normal   ELECTROPHYSIOLOGIC STUDY N/A 04/05/2015   Procedure: Cardioversion;  Surgeon: Jerel Balding, MD;  Location: [facility];  Service: Cardiovascular;  Laterality: N/A;   ELECTROPHYSIOLOGIC STUDY N/A 09/06/2015   Procedure: Atrial Fibrillation Ablation;  Surgeon: [name], MD;  Location: [facility] INVASIVE CV LAB;  Service: Cardiovascular;  Laterality: N/A;   ELECTROPHYSIOLOGIC STUDY N/A 07/12/2016   redo afib ablation by Dr Rakers   ENTEROSCOPY N/A 05/30/2022   Procedure: ENTEROSCOPY;  Surgeon: Shaaron Lamar HERO, MD;  [facility];  Service: Endoscopy;  Laterality: N/A;   ENTEROSCOPY N/A 01/07/2024   Procedure: ENTEROSCOPY;  Surgeon: Eartha Angelia Sieving, MD;  [facility];  Service: Gastroenterology;  Laterality: N/A;  1:45PM;ASA 3   ESOPHAGOGASTRODUODENOSCOPY (EGD) WITH PROPOFOL  N/A 05/24/2022   Procedure: ESOPHAGOGASTRODUODENOSCOPY  (EGD) WITH PROPOFOL ;  Surgeon: Shaaron Lamar HERO, MD;  [facility];  Service: Endoscopy;  Laterality: N/A;   ESOPHAGOGASTRODUODENOSCOPY (EGD) WITH PROPOFOL  N/A 01/10/2023   Procedure: ESOPHAGOGASTRODUODENOSCOPY (EGD) WITH PROPOFOL ;  Surgeon: Wilhelmenia Aloha Raddle., MD;  Location: [facility];  Service: Gastroenterology;  Laterality: N/A;   EUS N/A 01/10/2023   Procedure: UPPER ENDOSCOPIC ULTRASOUND (EUS) RADIAL;  Surgeon: Wilhelmenia Aloha Raddle., MD;  Location: [facility];  Service: Gastroenterology;  Laterality: N/A;   EXCISIONAL HEMORRHOIDECTOMY     inside and out   EYE SURGERY Left 2020   Cat Sx - Dr. Lelon   FINE NEEDLE ASPIRATION Right    knee; drew ~ 1 quart off   GIVENS CAPSULE STUDY N/A 05/25/2022   Procedure: GIVENS CAPSULE STUDY;  Surgeon: Cindie Carlin POUR, DO;  [facility]: [facility];  Service: Endoscopy;  Laterality: N/A;   GIVENS CAPSULE STUDY N/A 01/23/2023   Procedure: GIVENS CAPSULE STUDY;  Surgeon: Eartha Angelia Sieving, MD;  [facility];  Service: Gastroenterology;  Laterality: N/A;  8:30am   HERNIA REPAIR     HOT HEMOSTASIS N/A 01/10/2023   Procedure: HOT HEMOSTASIS (ARGON PLASMA COAGULATION/BICAP);  Surgeon: Wilhelmenia Aloha Raddle., MD;  Location: [facility];  Service: Gastroenterology;  Laterality: N/A;   HOT HEMOSTASIS  01/07/2024   Procedure: EGD, WITH ARGON PLASMA COAGULATION;  Surgeon: Eartha Angelia Sieving, MD;  [facility];  Service: Gastroenterology;;   LAPAROSCOPIC CHOLECYSTECTOMY     LEFT ATRIAL APPENDAGE OCCLUSION N/A 06/28/2022   Procedure: LEFT ATRIAL APPENDAGE OCCLUSION;  Surgeon: Wonda Sharper, MD;  Location[facility] LAB;  Service: Cardiovascular;  Laterality: N/A;   MELANOMA EXCISION Right    neck   NM MYOCAR PERF WALL MOTION  02/21/2012   EF 61% ,EXERCISE 7 METS. exercise stopped due to wheezing and shortness of breathe   POLYPECTOMY  05/16/2017   Procedure: POLYPECTOMY;  Surgeon: Golda Claudis PENNER, MD;  [facility];  Service: Endoscopy;;  colon   POLYPECTOMY  05/24/2022   Procedure: POLYPECTOMY;  Surgeon: Shaaron Lamar HERO, MD;  [facility];  Service: Endoscopy;;   POLYPECTOMY  01/10/2023   Procedure: POLYPECTOMY;  Surgeon: Wilhelmenia Aloha Raddle., MD;  Location: [facility];  Service: Gastroenterology;;   PROSTATECTOMY     RIGHT/LEFT HEART CATH AND CORONARY ANGIOGRAPHY N/A 01/27/2024   Procedure: RIGHT/LEFT HEART CATH AND CORONARY ANGIOGRAPHY;  Surgeon: Zenaida Morene PARAS, MD;  Location: [facility] INVASIVE CV LAB;  Service: Cardiovascular;  Laterality: N/A;   SHOULDER OPEN ROTATOR CUFF REPAIR Right X 2   SUBMUCOSAL TATTOO INJECTION  05/30/2022   Procedure: SUBMUCOSAL TATTOO INJECTION;  Surgeon: Shaaron Lamar HERO, MD;  [facility];  Service: Endoscopy;;   SUBMUCOSAL TATTOO INJECTION  01/10/2023   Procedure: SUBMUCOSAL TATTOO INJECTION;  Surgeon: Wilhelmenia Aloha Raddle., MD;  Location: [facility];  Service: Gastroenterology;;   TEE WITHOUT CARDIOVERSION N/A 09/05/2015  Procedure: TRANSESOPHAGEAL ECHOCARDIOGRAM (TEE);  Surgeon: Wilbert JONELLE Bihari, MD;  Location: [facility] ENDOSCOPY;  Service: Cardiovascular;  Laterality: N/A;   TEE WITHOUT CARDIOVERSION N/A 06/28/2022   Procedure: TRANSESOPHAGEAL ECHOCARDIOGRAM (TEE);  Surgeon: Wonda Sharper, MD;  Location: [facility] INVASIVE CV LAB;  Service: Cardiovascular;  Laterality: N/A;    Family History  Problem Relation Age of Onset   Heart disease Mother    Lung cancer Mother    Heart attack Mother 70   Diabetes Father    Heart disease Father    Stroke Brother    Healthy Daughter    Colon cancer Maternal Aunt        66s    Social History   Socioeconomic History   Marital status: Widowed    Spouse name: Not on file   Number of children: 1   Years of education: Not on file   Highest education level: GED or equivalent  Occupational History   Occupation: Retired  Tobacco Use   Smoking status: Former    Current  packs/day: 0.00    Average packs/day: 2.0 packs/day for 27.0 years (54.0 ttl pk-yrs)    Types: Cigarettes    Start date: 55    Quit date: 1992    Years since quitting: 33.5    Passive exposure: Current   Smokeless tobacco: Never   Tobacco comments:    Former smoker 09/27/21  Vaping Use   Vaping status: Never Used  Substance and Sexual Activity   Alcohol use: No    Alcohol/week: 0.0 standard drinks of alcohol    Comment: used to drink; stopped ~ 2008   Drug use: No   Sexual activity: Not Currently  Other Topics Concern   Not on file  Social History Narrative   Lives in [place], [place] with wife.   Social Drivers of Corporate investment banker Strain: Low Risk  (02/10/2024)   Overall Financial Resource Strain (CARDIA)    Difficulty of Paying Living Expenses: Not very hard  Food Insecurity: No Food Insecurity (02/10/2024)   Hunger Vital Sign    Worried About Running Out of Food in the Last Year: Never true    Ran Out of Food in the Last Year: Never true  Transportation Needs: No Transportation Needs (02/10/2024)   PRAPARE - Administrator, Civil Service (Medical): No    Lack of Transportation (Non-Medical): No  Physical Activity: Inactive (02/10/2024)   Exercise Vital Sign    Days of Exercise per Week: 0 days    Minutes of Exercise per Session: 0 min  Stress: No Stress Concern Present (02/10/2024)   [employer] of Occupational Health - Occupational Stress Questionnaire    Feeling of Stress : Only a little  Social Connections: Moderately Integrated (02/10/2024)   Social Connection and Isolation Panel    Frequency of Communication with Friends and Family: More than three times a week    Frequency of Social Gatherings with Friends and Family: More than three times a week    Attends Religious Services: More than 4 times per year    Active Member of [employer] or Organizations: Yes    Attends Banker Meetings: More than 4 times per year    Marital Status:  Widowed  Recent Concern: Social Connections - Moderately Isolated (11/20/2023)   Social Connection and Isolation Panel    Frequency of Communication with Friends and Family: More than three times a week    Frequency of Social Gatherings with Friends and Family: Once  a week    Attends Religious Services: More than 4 times per year    Active Member of Clubs or Organizations: No    Attends Banker Meetings: Never    Marital Status: Widowed  Intimate Partner Violence: Not At Risk (01/29/2024)   Humiliation, Afraid, Rape, and Kick questionnaire    Fear of Current or Ex-Partner: No    Emotionally Abused: No    Physically Abused: No    Sexually Abused: No    Outpatient Medications Prior to Visit  Medication Sig Dispense Refill   albuterol  (VENTOLIN  HFA) 108 (90 Base) MCG/ACT inhaler Inhale 2 puffs into the lungs every 6 (six) hours as needed for wheezing or shortness of breath.     ammonium lactate  (AMLACTIN DAILY) 12 % lotion Apply 1 Application topically as needed. 400 g 0   aspirin  81 MG chewable tablet Chew 81 mg by mouth daily.     camphor-menthol (ANTI-ITCH) lotion Apply 1 Application topically daily as needed for itching.     COMBIGAN  0.2-0.5 % ophthalmic solution Place 1 drop into both eyes 2 (two) times daily.     dapagliflozin  propanediol (FARXIGA ) 10 MG TABS tablet Take 1 tablet (10 mg total) by mouth daily. 90 tablet 1   diphenhydramine -acetaminophen  (TYLENOL  PM) 25-500 MG TABS tablet Take 1 tablet by mouth at bedtime.     ELDERBERRY PO Take 2 tablets by mouth daily. Chewable     ferrous sulfate  325 (65 FE) MG tablet Take 1 tablet (325 mg total) by mouth daily with breakfast. 120 tablet 1   fluticasone  (FLONASE ) 50 MCG/ACT nasal spray SPRAY 2 SPRAYS INTO EACH NOSTRIL EVERY DAY (Patient taking differently: Place 2 sprays into both nostrils daily.) 48 mL 1   gemfibrozil  (LOPID ) 600 MG tablet Take 600 mg by mouth at bedtime.      glimepiride  (AMARYL ) 2 MG tablet Take 1  tablet (2 mg total) by mouth daily with breakfast. (Patient taking differently: Take 2 mg by mouth at bedtime.) 30 tablet 0   Insulin  Glargine (BASAGLAR  KWIKPEN) 100 UNIT/ML Inject 45 Units into the skin at bedtime. (Patient taking differently: Inject 32 Units into the skin at bedtime.) 15 mL 3   magnesium  gluconate (MAGONATE) 500 MG tablet Take 500 mg by mouth daily.     mineral oil liquid Take 15 mLs by mouth daily as needed for moderate constipation.     Omega-3 Fatty Acids (FISH OIL EXTRA STRENGTH) 1200 MG CAPS Take 1 capsule by mouth daily.     OVER THE COUNTER MEDICATION Take 1 tablet by mouth daily. Beets Chew     pantoprazole  (PROTONIX ) 40 MG tablet TAKE 1 TABLET BY MOUTH EVERY DAY 90 tablet 1   torsemide  (DEMADEX ) 20 MG tablet Take 1 tablet (20 mg total) by mouth 2 (two) times daily. 60 tablet 1   vitamin E 45 MG (100 UNITS) capsule Take 100 Units by mouth daily.     zinc  gluconate 50 MG tablet Take 50 mg by mouth daily.     predniSONE  (DELTASONE ) 5 MG tablet Take one tablet by mouth once daoily for 5 days only 5 tablet 0   No facility-administered medications prior to visit.    Allergies  Allergen Reactions   Azithromycin  Nausea Only   Tape Rash and Other (See Comments)    Causes skin redness, Use paper tape only.    Review of Systems  Constitutional:  Positive for fatigue. Negative for chills and fever.  HENT:  Negative for congestion and  sore throat.   Eyes:  Negative for pain and discharge.  Respiratory:  Positive for shortness of breath (Intermittent). Negative for cough.   Cardiovascular:  Positive for leg swelling. Negative for chest pain and palpitations.  Gastrointestinal:  Negative for diarrhea, nausea and vomiting.  Endocrine: Negative for polydipsia and polyuria.  Genitourinary:  Positive for dysuria and frequency. Negative for hematuria.  Musculoskeletal:  Negative for neck pain and neck stiffness.  Skin:  Negative for rash.  Neurological:  Negative for  dizziness and weakness.  Psychiatric/Behavioral:  Negative for agitation and behavioral problems.        Objective:    Physical Exam Vitals reviewed.  Constitutional:      General: He is not in acute distress.    Appearance: He is obese. He is not diaphoretic.  HENT:     Head: Normocephalic and atraumatic.     Nose: No congestion.     Mouth/Throat:     Mouth: Mucous membranes are moist.   Eyes:     General: No scleral icterus.    Extraocular Movements: Extraocular movements intact.    Cardiovascular:     Rate and Rhythm: Normal rate and regular rhythm.     Heart sounds: Normal heart sounds. No murmur heard. Pulmonary:     Breath sounds: Normal breath sounds. No wheezing or rales.  Abdominal:     Palpations: Abdomen is soft.     Tenderness: There is abdominal tenderness (Mild, suprapubic).   Musculoskeletal:     Cervical back: Neck supple. No tenderness.     Right lower leg: Edema (Mild) present.     Left lower leg: Edema (Mild) present.   Skin:    General: Skin is warm.     Findings: Rash (Stasis dermatitis over bilateral legs) present.   Neurological:     General: No focal deficit present.     Mental Status: He is alert and oriented to person, place, and time.     Sensory: No sensory deficit.     Motor: No weakness.   Psychiatric:        Mood and Affect: Mood normal.        Behavior: Behavior normal.     BP 134/72 (BP Location: Left Arm)   Pulse 73   Ht 5' 11 (1.803 m)   Wt 229 lb 3.2 oz (104 kg)   SpO2 91%   BMI 31.97 kg/m  Wt Readings from Last 3 Encounters:  04/09/24 229 lb 3.2 oz (104 kg)  04/06/24 225 lb 6.4 oz (102.2 kg)  03/20/24 229 lb 1.9 oz (103.9 kg)        Assessment & Plan:   Problem List Items Addressed This Visit       Genitourinary   Acute cystitis without hematuria - Primary   Considering his symptoms, started empiric Bactrim Check UA with reflex culture Maintain adequate hydration      Relevant Medications    sulfamethoxazole-trimethoprim (BACTRIM DS) 800-160 MG tablet   Other Relevant Orders   UA/M w/rflx Culture, Comp     Meds ordered this encounter  Medications   sulfamethoxazole-trimethoprim (BACTRIM DS) 800-160 MG tablet    Sig: Take 1 tablet by mouth 2 (two) times daily.    Dispense:  10 tablet    Refill:  0     Avriel Kandel MARLA Blanch, MD

## 2024-04-09 NOTE — Patient Instructions (Addendum)
 Please start taking Bactrim as prescribed.  Please maintain at least 50 ounces of fluid intake in a day.

## 2024-04-10 LAB — UA/M W/RFLX CULTURE, ROUTINE
Bilirubin, UA: NEGATIVE
Ketones, UA: NEGATIVE
Leukocytes,UA: NEGATIVE
Nitrite, UA: NEGATIVE
Protein,UA: NEGATIVE
Specific Gravity, UA: 1.022 (ref 1.005–1.030)
Urobilinogen, Ur: 0.2 mg/dL (ref 0.2–1.0)
pH, UA: 6 (ref 5.0–7.5)

## 2024-04-10 LAB — MICROSCOPIC EXAMINATION
Bacteria, UA: NONE SEEN
Casts: NONE SEEN /LPF
Epithelial Cells (non renal): NONE SEEN /HPF (ref 0–10)
WBC, UA: NONE SEEN /HPF (ref 0–5)

## 2024-04-23 NOTE — Progress Notes (Addendum)
 [facility] - Clinic Note  04/24/2024    CHIEF COMPLAINT Patient presents for Retina Follow Up  HISTORY OF PRESENT ILLNESS: Lucas Dudley is a 83 y.o. male who presents to the clinic today for:  HPI     Retina Follow Up   Patient presents with  Diabetic Retinopathy.  In both eyes.  This started 6 weeks ago.  I, the attending physician,  performed the HPI with the patient and updated documentation appropriately.        Comments   Patient here for 6 weeks retina follow up for NPDR OU.  Patient states vision about like always. No eye pain.       Last edited by Valdemar Rogue, MD on 04/24/2024 10:00 PM.    Pt states    Referring physician: [name], MD [address] [place],  [place] [address]  HISTORICAL INFORMATION:   Selected notes from the MEDICAL RECORD NUMBER Referred by Dr. Lelon for DEE   CURRENT MEDICATIONS: Current Outpatient Medications (Ophthalmic Drugs)  Medication Sig   COMBIGAN  0.2-0.5 % ophthalmic solution Place 1 drop into both eyes 2 (two) times daily.   No current facility-administered medications for this visit. (Ophthalmic Drugs)   Current Outpatient Medications (Other)  Medication Sig   albuterol  (VENTOLIN  HFA) 108 (90 Base) MCG/ACT inhaler Inhale 2 puffs into the lungs every 6 (six) hours as needed for wheezing or shortness of breath.   ammonium lactate  (AMLACTIN DAILY) 12 % lotion Apply 1 Application topically as needed.   aspirin  81 MG chewable tablet Chew 81 mg by mouth daily.   camphor-menthol (ANTI-ITCH) lotion Apply 1 Application topically daily as needed for itching.   dapagliflozin  propanediol (FARXIGA ) 10 MG TABS tablet Take 1 tablet (10 mg total) by mouth daily.   diphenhydramine -acetaminophen  (TYLENOL  PM) 25-500 MG TABS tablet Take 1 tablet by mouth at bedtime.   ELDERBERRY PO Take 2 tablets by mouth daily. Chewable   ferrous sulfate  325 (65 FE) MG tablet Take 1 tablet (325 mg total) by mouth daily with  breakfast.   fluticasone  (FLONASE ) 50 MCG/ACT nasal spray SPRAY 2 SPRAYS INTO EACH NOSTRIL EVERY DAY (Patient taking differently: Place 2 sprays into both nostrils daily.)   gemfibrozil  (LOPID ) 600 MG tablet Take 600 mg by mouth at bedtime.    glimepiride  (AMARYL ) 2 MG tablet Take 1 tablet (2 mg total) by mouth daily with breakfast. (Patient taking differently: Take 2 mg by mouth at bedtime.)   Insulin  Glargine (BASAGLAR  KWIKPEN) 100 UNIT/ML Inject 45 Units into the skin at bedtime. (Patient taking differently: Inject 32 Units into the skin at bedtime.)   magnesium  gluconate (MAGONATE) 500 MG tablet Take 500 mg by mouth daily.   mineral oil liquid Take 15 mLs by mouth daily as needed for moderate constipation.   Omega-3 Fatty Acids (FISH OIL EXTRA STRENGTH) 1200 MG CAPS Take 1 capsule by mouth daily.   OVER THE COUNTER MEDICATION Take 1 tablet by mouth daily. Beets Chew   pantoprazole  (PROTONIX ) 40 MG tablet TAKE 1 TABLET BY MOUTH EVERY DAY   torsemide  (DEMADEX ) 20 MG tablet Take 1 tablet (20 mg total) by mouth 2 (two) times daily.   vitamin E 45 MG (100 UNITS) capsule Take 100 Units by mouth daily.   zinc  gluconate 50 MG tablet Take 50 mg by mouth daily.   sulfamethoxazole -trimethoprim  (BACTRIM  DS) 800-160 MG tablet Take 1 tablet by mouth 2 (two) times daily. (Patient not taking: Reported on 04/24/2024)  No current facility-administered medications for this visit. (Other)   REVIEW OF SYSTEMS: ROS   Positive for: Gastrointestinal, Endocrine, Cardiovascular, Eyes, Respiratory Negative for: Constitutional, Neurological, Skin, Genitourinary, Musculoskeletal, HENT, Psychiatric, Allergic/Imm, Heme/Lymph Last edited by [name], COA on 04/24/2024  2:02 PM.     ALLERGIES Allergies  Allergen Reactions   Azithromycin  Nausea Only   Tape Rash and Other (See Comments)    Causes skin redness, Use paper tape only.   PAST MEDICAL HISTORY Past Medical History:  Diagnosis Date   Arthritis     Atrial fibrillation (HCC)    CAD (coronary artery disease)    a. Cath 03/17/15 showing 100% ostial D1, 50% prox LAD to mid LAD, 40% RPDA stenosis. Med rx. // Myoview  01/2020: EF 31 diffuse perfusion defect without reversibility (suspect artifact); reviewed with Dr. Suellen study felt to be low risk   Cataract    Mixed form OD   Chronic diastolic CHF (congestive heart failure) (HCC)    Chronically elevated hemidiaphragm - Right Side    Diabetic retinopathy (HCC)    NPDR OU   Essential hypertension    Glaucoma    POAG OU   History of gout    Hyperlipidemia    Hypertensive retinopathy    OU   Kidney stones    Melanoma of neck (HCC)    NICM (nonischemic cardiomyopathy) (HCC)    OSA on CPAP 2012   Prostate cancer (HCC)    Type II diabetes mellitus (HCC)    Past Surgical History:  Procedure Laterality Date   ABDOMINAL HERNIA REPAIR     w/mesh   ATRIAL FIBRILLATION ABLATION N/A 06/06/2021   Procedure: ATRIAL FIBRILLATION ABLATION;  Surgeon: Kelsie Agent, MD;  Location: [facility];  Service: Cardiovascular;  Laterality: N/A;   BIOPSY  05/24/2022   Procedure: BIOPSY;  Surgeon: Shaaron Lamar HERO, MD;  [facility]: [facility];  Service: Endoscopy;;   BIOPSY  05/30/2022   Procedure: BIOPSY;  Surgeon: Shaaron Lamar HERO, MD;  [facility];  Service: Endoscopy;;   BIOPSY  01/10/2023   Procedure: BIOPSY;  Surgeon: Wilhelmenia Aloha Raddle., MD;  Location: [facility];  Service: Gastroenterology;;   CARDIAC CATHETERIZATION N/A 03/17/2015   Procedure: Left Heart Cath and Coronary Angiography;  Surgeon: Dorn JINNY Lesches, MD;  Location: [facility] INVASIVE CV LAB;  Service: Cardiovascular;  Laterality: N/A;   CARDIOVERSION N/A 08/09/2021   Procedure: CARDIOVERSION;  Surgeon: Raford Riggs, MD;  Location: [facility]OSCOPY;  Service: Cardiovascular;  Laterality: N/A;   CARDIOVERSION N/A 11/10/2021   Procedure: CARDIOVERSION;  Surgeon: Sheena Pugh, DO;  Location: [facility];  Service:  Cardiovascular;  Laterality: N/A;   carotid doppler  09/17/2008   rigt and left ICAs 0-49%;mildly  abnormal   CATARACT EXTRACTION Left 2020   Dr. Lelon   COLONOSCOPY N/A 05/16/2017   Procedure: COLONOSCOPY;  Surgeon: Golda Claudis PENNER, MD;  [facility];  Service: Endoscopy;  Laterality: N/A;  930   COLONOSCOPY WITH PROPOFOL  N/A 05/24/2022   Procedure: COLONOSCOPY WITH PROPOFOL ;  Surgeon: Shaaron Lamar HERO, MD;  [facility];  Service: Endoscopy;  Laterality: N/A;   DOPPLER ECHOCARDIOGRAPHY  05/25/2009   EF 50-55%,LA mildly dilated, LV function normal   ELECTROPHYSIOLOGIC STUDY N/A 04/05/2015   Procedure: Cardioversion;  Surgeon: Jerel Balding, MD;  Location: [facility];  Service: Cardiovascular;  Laterality: N/A;   ELECTROPHYSIOLOGIC STUDY N/A 09/06/2015   Procedure: Atrial Fibrillation Ablation;  Surgeon: Agent Kelsie, MD;  Location:  [facility];  Service: Cardiovascular;  Laterality: N/A;   ELECTROPHYSIOLOGIC STUDY N/A 07/12/2016   redo afib ablation by Dr Kelsie   ENTEROSCOPY N/A 05/30/2022   Procedure: ENTEROSCOPY;  Surgeon: Shaaron Lamar HERO, MD;  [facility];  Service: Endoscopy;  Laterality: N/A;   ENTEROSCOPY N/A 01/07/2024   Procedure: ENTEROSCOPY;  Surgeon: Eartha Angelia Sieving, MD;  [facility];  Service: Gastroenterology;  Laterality: N/A;  1:45PM;ASA 3   ESOPHAGOGASTRODUODENOSCOPY (EGD) WITH PROPOFOL  N/A 05/24/2022   Procedure: ESOPHAGOGASTRODUODENOSCOPY (EGD) WITH PROPOFOL ;  Surgeon: Shaaron Lamar HERO, MD;  [facility];  Service: Endoscopy;  Laterality: N/A;   ESOPHAGOGASTRODUODENOSCOPY (EGD) WITH PROPOFOL  N/A 01/10/2023   Procedure: ESOPHAGOGASTRODUODENOSCOPY (EGD) WITH PROPOFOL ;  Surgeon: Wilhelmenia Aloha Raddle., MD;  Location: [facility];  Service: Gastroenterology;  Laterality: N/A;   EUS N/A 01/10/2023   Procedure: UPPER ENDOSCOPIC ULTRASOUND (EUS) RADIAL;  Surgeon: Wilhelmenia Aloha Raddle., MD;  Location:  [facility];  Service: Gastroenterology;  Laterality: N/A;   EXCISIONAL HEMORRHOIDECTOMY     inside and out   EYE SURGERY Left 2020   Cat Sx - Dr. Lelon   FINE NEEDLE ASPIRATION Right    knee; drew ~ 1 quart off   GIVENS CAPSULE STUDY N/A 05/25/2022   Procedure: GIVENS CAPSULE STUDY;  Surgeon: Cindie Carlin POUR, DO;  [facility];  Service: Endoscopy;  Laterality: N/A;   GIVENS CAPSULE STUDY N/A 01/23/2023   Procedure: GIVENS CAPSULE STUDY;  Surgeon: Eartha Angelia Sieving, MD;  [facility];  Service: Gastroenterology;  Laterality: N/A;  8:30am   HERNIA REPAIR     HOT HEMOSTASIS N/A 01/10/2023   Procedure: HOT HEMOSTASIS (ARGON PLASMA COAGULATION/BICAP);  Surgeon: Wilhelmenia Aloha Raddle., MD;  Location: [facility];  Service: Gastroenterology;  Laterality: N/A;   HOT HEMOSTASIS  01/07/2024   Procedure: EGD, WITH ARGON PLASMA COAGULATION;  Surgeon: Eartha Angelia Sieving, MD;  [facility];  Service: Gastroenterology;;   LAPAROSCOPIC CHOLECYSTECTOMY     LEFT ATRIAL APPENDAGE OCCLUSION N/A 06/28/2022   Procedure: LEFT ATRIAL APPENDAGE OCCLUSION;  Surgeon: Wonda Sharper, MD;  Location: [facility] INVASIVE CV LAB;  Service: Cardiovascular;  Laterality: N/A;   MELANOMA EXCISION Right    neck   NM MYOCAR PERF WALL MOTION  02/21/2012   EF 61% ,EXERCISE 7 METS. exercise stopped due to wheezing and shortness of breathe   POLYPECTOMY  05/16/2017   Procedure: POLYPECTOMY;  Surgeon: Golda Claudis PENNER, MD;  [facility];  Service: Endoscopy;;  colon   POLYPECTOMY  05/24/2022   Procedure: POLYPECTOMY;  Surgeon: Shaaron Lamar HERO, MD;  [facility];  Service: Endoscopy;;   POLYPECTOMY  01/10/2023   Procedure: POLYPECTOMY;  Surgeon: Wilhelmenia Aloha Raddle., MD;  Location: [facility];  Service: Gastroenterology;;   PROSTATECTOMY     RIGHT/LEFT HEART CATH AND CORONARY ANGIOGRAPHY N/A 01/27/2024   Procedure: RIGHT/LEFT HEART CATH AND CORONARY  ANGIOGRAPHY;  Surgeon: Zenaida Morene PARAS, MD;  Location: [facility] INVASIVE CV LAB;  Service: Cardiovascular;  Laterality: N/A;   SHOULDER OPEN ROTATOR CUFF REPAIR Right X 2   SUBMUCOSAL TATTOO INJECTION  05/30/2022   Procedure: SUBMUCOSAL TATTOO INJECTION;  Surgeon: Shaaron Lamar HERO, MD;  [facility];  Service: Endoscopy;;   SUBMUCOSAL TATTOO INJECTION  01/10/2023   Procedure: SUBMUCOSAL TATTOO INJECTION;  Surgeon: Wilhelmenia Aloha Raddle., MD;  Location: [facility];  Service: Gastroenterology;;   TEE WITHOUT CARDIOVERSION N/A 09/05/2015   Procedure: TRANSESOPHAGEAL ECHOCARDIOGRAM (TEE);  Surgeon:  Wilbert JONELLE Bihari, MD;  Location: [facility];  Service: Cardiovascular;  Laterality: N/A;   TEE WITHOUT CARDIOVERSION N/A 06/28/2022   Procedure: TRANSESOPHAGEAL ECHOCARDIOGRAM (TEE);  Surgeon: Wonda Sharper, MD;  Location: [facility] INVASIVE CV LAB;  Service: Cardiovascular;  Laterality: N/A;   FAMILY HISTORY Family History  Problem Relation Age of Onset   Heart disease Mother    Lung cancer Mother    Heart attack Mother 33   Diabetes Father    Heart disease Father    Stroke Brother    Healthy Daughter    Colon cancer Maternal Aunt        38s   SOCIAL HISTORY Social History   Tobacco Use   Smoking status: Former    Current packs/day: 0.00    Average packs/day: 2.0 packs/day for 27.0 years (54.0 ttl pk-yrs)    Types: Cigarettes    Start date: 42    Quit date: 1992    Years since quitting: 33.5    Passive exposure: Current   Smokeless tobacco: Never   Tobacco comments:    Former smoker 09/27/21  Vaping Use   Vaping status: Never Used  Substance Use Topics   Alcohol use: No    Alcohol/week: 0.0 standard drinks of alcohol    Comment: used to drink; stopped ~ 2008   Drug use: No       OPHTHALMIC EXAM: Base Eye Exam     Visual Acuity (Snellen - Linear)       Right Left   Dist cc 20/20 -2 20/20    Correction: Glasses         Tonometry (Tonopen, 2:00 PM)        Right Left   Pressure 21 22         Pupils       Dark Light Shape React APD   Right 2 1 Round Brisk None   Left 2 1 Round Brisk None         Visual Fields (Counting fingers)       Left Right    Full Full         Extraocular Movement       Right Left    Full, Ortho Full, Ortho         Neuro/Psych     Oriented x3: Yes   Mood/Affect: Normal         Dilation     Both eyes: 1.0% Mydriacyl, 2.5% Phenylephrine  @ 2:00 PM           Slit Lamp and Fundus Exam     Slit Lamp Exam       Right Left   Lids/Lashes Dermato, mild MGD Dermato, mild MGD   Conjunctiva/Sclera Temporal pinguecula, mild inferior sub conj heme Temporal pinguecula   Cornea EBMD, mild haze, trace PEE, mild Debris in tear film, well healed cataract wound trace haze, trace PEE, well healed temporal cataract wounds, arcus   Anterior Chamber Deep and clear; narrow temporal angle Deep and clear   Iris Round and dilated, mild anterior bowing, No NVI Round and moderately dilated to 5.87mm   Lens PCIOL in good position, trace PCO PC IOL in good position with open PC   Anterior Vitreous Synerisis Synerisis         Fundus Exam       Right Left   Disc Mild pallor, sharp rim, +cupping, thin inferior rim Mild pallor, sharp rim, +cupping, +PPA   C/D Ratio 0.7 0.7   Macula Flat,  good foveal reflex, trace cystic changes - persistent, RPE mottling and clumping, no heme, Drusen, no exudates Flat, blunted foveal reflex, trace cystic changes, punctate MA -- improved   Vessels attenuated, Tortuous attenuated, Tortuous   Periphery Attached, rare MA, focal DBH nasal to disc--improved Attached. No heme.           Refraction     Wearing Rx       Sphere Cylinder Axis Add   Right -0.25 +2.00 158 +2.50   Left -1.00 +1.25 008 +2.50           IMAGING AND PROCEDURES  Imaging and Procedures for 04/24/2024  OCT, Retina - OU - Both Eyes       Right Eye Quality was good. Central Foveal Thickness:  271. Progression has been stable. Findings include no SRF, abnormal foveal contour, intraretinal fluid, vitreomacular adhesion (persistent perifoveal IRF/IRHM, partial PVD).   Left Eye Quality was good. Central Foveal Thickness: 274. Progression has been stable. Findings include normal foveal contour, no IRF, no SRF, intraretinal hyper-reflective material (persistent cystic changes and punctate IRHM, partial PVD).   Notes *Images captured and stored on drive  Diagnosis / Impression:  +DME OU OD: persistent perifoveal IRF/IRHM,  partial PVD OS: persistent cystic changes and punctate IRHM, partial PVD  Clinical management:  See below  Abbreviations: NFP - Normal foveal profile. CME - cystoid macular edema. PED - pigment epithelial detachment. IRF - intraretinal fluid. SRF - subretinal fluid. EZ - ellipsoid zone. ERM - epiretinal membrane. ORA - outer retinal atrophy. ORT - outer retinal tubulation. SRHM - subretinal hyper-reflective material. IRHM - intraretinal hyper-reflective material     Intravitreal Injection, Pharmacologic Agent - OD - Right Eye       Time Out 04/24/2024. 2:45 PM. Confirmed correct patient, procedure, site, and patient consented.   Anesthesia Topical anesthesia was used. Anesthetic medications included Lidocaine  2%, Proparacaine 0.5%.   Procedure Preparation included 5% betadine to ocular surface, eyelid speculum. A (32g) needle was used.   Injection: 6 mg faricimab -svoa 6 MG/0.05ML (Patient supplied)   Route: Intravitreal, Site: Right Eye   NDC: 49757-903-98, Lot: [id number], Expiration date: 02/11/2025, Waste: 0 mL   Post-op Post injection exam found visual acuity of at least counting fingers. The patient tolerated the procedure well. There were no complications. The patient received written and verbal post procedure care education.   Notes **PAP MEDICATION ADMINISTERED**      Intravitreal Injection, Pharmacologic Agent - OS - Left Eye       Time  Out 04/24/2024. 2:46 PM. Confirmed correct patient, procedure, site, and patient consented.   Anesthesia Topical anesthesia was used. Anesthetic medications included Lidocaine  2%, Proparacaine 0.5%.   Procedure Preparation included 5% betadine to ocular surface, eyelid speculum. A (32g) needle was used.   Injection: 6 mg faricimab -svoa 6 MG/0.05ML (Patient supplied)   Route: Intravitreal, Site: Left Eye   NDC: 49757-903-98, Lot: [id number], Expiration date: 02/11/2025, Waste: 0 mL   Post-op Post injection exam found visual acuity of at least counting fingers. The patient tolerated the procedure well. There were no complications. The patient received written and verbal post procedure care education. Post injection medications were not given.   Notes **PAP MEDICATION ADMINISTERED**           ASSESSMENT/PLAN:   ICD-10-CM   1. 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, associated with type 2 diabetes mellitus (HCC)  E11.3213 OCT, Retina - OU - Both Eyes    Intravitreal Injection,  Pharmacologic Agent - OD - Right Eye    Intravitreal Injection, Pharmacologic Agent - OS - Left Eye    faricimab -svoa (VABYSMO ) 6mg /0.69mL intravitreal injection    faricimab -svoa (VABYSMO ) 6mg /0.56mL intravitreal injection    2. Current use of insulin  (HCC)  Z79.4     3. Long term (current) use of oral hypoglycemic drugs  Z79.84     4. Essential hypertension  I10     5. Hypertensive retinopathy of both eyes  H35.033     6. Pseudophakia of both eyes  Z96.1     7. 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 Single focal MA superior to fovea; OS: Perifoveal Mas w/ late leakage - s/p IVA OS # 1(07.26.21), #2 (08.24.21), #3 (09.21.21), #4 (10.22.21) -- IVA resistance ============================= - s/p IVE OD #1 (09.29.23), #2 (10.30.23), #3 (SAMPLE)  (11.27.23) #4 (12.29.23) #5 (01.26.24) #6 (02.23.24) -- IVE resistance - s/p IVE OS #1 (11.22.21), #2 (12.21.21), #3 (01.20.22), #4 (02.18.22), #5 (03.18.22), #6 (04.15.22), #7 (05.10.22) -- sample, #8 (06.08.22), #9 (07.12.22), #10 (08.12.22), #11 (09.16.22), #12 (10.21.22), #13 (11.18.22), #14 (12.21.22), #15 (01.20.23), #16 (02.17.23), #17 (03.24.23), #18 (04.21.23), #19 (05.26.23), #20 (06.23.23) -- IVE resistance ============================ - s/p IVV OD #1 (03.22.24), #2 (04.19.24), #3 (05.17.24), #4 (06.18.24), #5 (07.19.24), #6 (08.23.24), #7 (09.27.24), #8 (11.01.24), #9 (12.06.24), #10 (01.10.25 -- sample), #11 (02.14.25 -- sample) #12 (03.21.25 -- sample), #13 (04.25.25 -- sample) #14(05.30.25) - s/p IVV OS #1 (07.21.23), #2 (08.25.23), #3 (09.29.23), #4 (10.30.23), #5 (11.27.23) #6 (12.29.23), #7 (01.26.23), #8 (02.23.24), #9 (03.22.24), #10 (04.19.24), #11 (05.17.24), #12 (12.06.24), #13 (02.14.25 -- sample), #14 (03.21.25 -- sample),  #15 (04.25.25 -- sample), #16 (05.30.25 -- sample) ============================ - BCVA OD: 20/20 -- stable; OS improved to 20/20 from 20/25 - OCT shows OD: persistent perifoveal IRF/IRHM,  partial PVD; OS: persistent cystic changes and punctate IRHM, partial PVD at 6 weeks - recommend IVV OD #15 and IVV OS #17 [PAP medication] today, 05.30 25 with f/u in 6 weeks again [Good Days funding unavailable] - pt wishes to proceed with injections OU  - RBA of procedure discussed, questions answered  - Vabysmo  informed consent obtained and signed, 02.14.25 (OU) - [employer] approved Vabysmo , no good days for 2025 as of now - see procedure note - pt has PAP medication - f/u 6 weeks -- DFE/OCT, possible injection(s)  4,5. Hypertensive retinopathy OU - discussed importance of tight BP control - continue to monitor  6. Pseudophakia OU  - s/p CE/IOL OS (2020, Dr. Lelon)             - s/p CE/IOL OD (2022, Dr. 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 Caresse in August 2022  - s/p SuperK OS on 12.12.2022  8. History of herpes zoster/iritis OS  - on Valtrex  1 g daily---maintenance  9. POAG OU  - was under the expert management of Dr. Lelon, now follows with Dr. Fleeta  - IOP 21,22  - continue Cosopt  bid OU  Ophthalmic Meds Ordered this visit:  Meds ordered this encounter  Medications   faricimab -svoa (VABYSMO ) 6mg /0.62mL intravitreal injection   faricimab -svoa (VABYSMO ) 6mg /0.42mL intravitreal injection     This document  serves as a record of services personally performed by Redell JUDITHANN Hans, MD, PhD. It was created on their behalf by Delon Newness COT, an ophthalmic technician. The creation of this record is the provider's dictation and/or activities during the visit.    Electronically signed by: Delon Newness COT 07.10.25  10:12 PM  Redell JUDITHANN Hans, M.D., Ph.D. Diseases & Surgery of the Retina and Vitreous [facility] 04/24/2024   I have reviewed the above documentation for accuracy and completeness, and I agree with the above. Redell JUDITHANN Hans, M.D., Ph.D. 04/24/24 10:12 PM   Abbreviations: M myopia (nearsighted); A astigmatism; H hyperopia (farsighted); P presbyopia; Mrx spectacle prescription;  CTL contact lenses; OD right eye; OS left eye; OU both eyes  XT exotropia; ET esotropia; PEK punctate epithelial keratitis; PEE punctate epithelial erosions; DES dry eye syndrome; MGD meibomian gland dysfunction; ATs artificial tears; PFAT's preservative free artificial tears; NSC nuclear sclerotic cataract; PSC posterior subcapsular cataract; ERM epi-retinal membrane; PVD posterior vitreous detachment; RD retinal detachment; DM diabetes mellitus; DR diabetic retinopathy; NPDR non-proliferative diabetic retinopathy; PDR proliferative diabetic retinopathy; CSME clinically  significant macular edema; DME diabetic macular edema; dbh dot blot hemorrhages; CWS cotton wool spot; POAG primary open angle glaucoma; C/D cup-to-disc ratio; HVF humphrey visual field; GVF goldmann visual field; OCT optical coherence tomography; IOP intraocular pressure; BRVO Branch retinal vein occlusion; CRVO central retinal vein occlusion; CRAO central retinal artery occlusion; BRAO branch retinal artery occlusion; RT retinal tear; SB scleral buckle; PPV pars plana vitrectomy; VH Vitreous hemorrhage; PRP panretinal laser photocoagulation; IVK intravitreal kenalog; VMT vitreomacular traction; MH Macular hole;  NVD neovascularization of the disc; NVE neovascularization elsewhere; AREDS age related eye disease study; ARMD age related macular degeneration; POAG primary open angle glaucoma; EBMD epithelial/anterior basement membrane dystrophy; ACIOL anterior chamber intraocular lens; IOL intraocular lens; PCIOL posterior chamber intraocular lens; Phaco/IOL phacoemulsification with intraocular lens placement; PRK photorefractive keratectomy; LASIK laser assisted in situ keratomileusis; HTN hypertension; DM diabetes mellitus; COPD chronic obstructive pulmonary disease

## 2024-04-24 ENCOUNTER — Encounter (INDEPENDENT_AMBULATORY_CARE_PROVIDER_SITE_OTHER): Payer: Self-pay | Admitting: Ophthalmology

## 2024-04-24 ENCOUNTER — Ambulatory Visit (INDEPENDENT_AMBULATORY_CARE_PROVIDER_SITE_OTHER): Admitting: Ophthalmology

## 2024-04-24 DIAGNOSIS — Z794 Long term (current) use of insulin: Secondary | ICD-10-CM | POA: Diagnosis not present

## 2024-04-24 DIAGNOSIS — E113213 Type 2 diabetes mellitus with mild nonproliferative diabetic retinopathy with macular edema, bilateral: Secondary | ICD-10-CM | POA: Diagnosis not present

## 2024-04-24 DIAGNOSIS — I10 Essential (primary) hypertension: Secondary | ICD-10-CM

## 2024-04-24 DIAGNOSIS — H35033 Hypertensive retinopathy, bilateral: Secondary | ICD-10-CM

## 2024-04-24 DIAGNOSIS — Z961 Presence of intraocular lens: Secondary | ICD-10-CM

## 2024-04-24 DIAGNOSIS — H18523 Epithelial (juvenile) corneal dystrophy, bilateral: Secondary | ICD-10-CM

## 2024-04-24 DIAGNOSIS — Z7984 Long term (current) use of oral hypoglycemic drugs: Secondary | ICD-10-CM | POA: Diagnosis not present

## 2024-04-24 MED ORDER — FARICIMAB-SVOA 6 MG/0.05ML IZ SOLN
6.0000 mg | INTRAVITREAL | Status: AC | PRN
  Administered 2024-04-24: 6 mg via INTRAVITREAL

## 2024-05-05 ENCOUNTER — Encounter: Attending: Gastroenterology | Admitting: *Deleted

## 2024-05-05 VITALS — BP 161/77 | HR 67 | Temp 97.8°F | Resp 16

## 2024-05-05 DIAGNOSIS — K5520 Angiodysplasia of colon without hemorrhage: Secondary | ICD-10-CM | POA: Insufficient documentation

## 2024-05-05 MED ORDER — OCTREOTIDE ACETATE 20 MG IM KIT
20.0000 mg | PACK | Freq: Once | INTRAMUSCULAR | Status: AC
Start: 2024-05-05 — End: 2024-05-05
  Administered 2024-05-05: 20 mg via INTRAMUSCULAR

## 2024-05-05 NOTE — Progress Notes (Signed)
 Diagnosis: AVM of small bowel  Provider:  Samantha Cress MD  Procedure: Injection  Sandostatin , Dose: 20 mg, Site: intramuscular, Number of injections: 1  Injection Site(s): Right upper quad. gluteus  Post Care: Observation period completed  Discharge: Condition: Good, Destination: Home . AVS Provided  Performed by:  Verneda Golder, RN

## 2024-05-17 ENCOUNTER — Other Ambulatory Visit: Payer: Self-pay | Admitting: Cardiology

## 2024-05-19 ENCOUNTER — Encounter: Payer: Self-pay | Admitting: Nurse Practitioner

## 2024-05-19 ENCOUNTER — Ambulatory Visit: Attending: Nurse Practitioner | Admitting: Nurse Practitioner

## 2024-05-19 VITALS — BP 120/68 | HR 61 | Ht 70.0 in | Wt 225.0 lb

## 2024-05-19 DIAGNOSIS — I4819 Other persistent atrial fibrillation: Secondary | ICD-10-CM | POA: Diagnosis not present

## 2024-05-19 DIAGNOSIS — N1832 Chronic kidney disease, stage 3b: Secondary | ICD-10-CM

## 2024-05-19 DIAGNOSIS — G4733 Obstructive sleep apnea (adult) (pediatric): Secondary | ICD-10-CM

## 2024-05-19 DIAGNOSIS — E785 Hyperlipidemia, unspecified: Secondary | ICD-10-CM

## 2024-05-19 DIAGNOSIS — I10 Essential (primary) hypertension: Secondary | ICD-10-CM

## 2024-05-19 DIAGNOSIS — I2510 Atherosclerotic heart disease of native coronary artery without angina pectoris: Secondary | ICD-10-CM

## 2024-05-19 DIAGNOSIS — I5032 Chronic diastolic (congestive) heart failure: Secondary | ICD-10-CM | POA: Diagnosis not present

## 2024-05-19 DIAGNOSIS — I272 Pulmonary hypertension, unspecified: Secondary | ICD-10-CM | POA: Diagnosis not present

## 2024-05-19 DIAGNOSIS — I5042 Chronic combined systolic (congestive) and diastolic (congestive) heart failure: Secondary | ICD-10-CM

## 2024-05-19 MED ORDER — DAPAGLIFLOZIN PROPANEDIOL 10 MG PO TABS: 10.0000 mg | ORAL_TABLET | Freq: Every day | ORAL | 1 refills | Status: AC

## 2024-05-19 NOTE — Patient Instructions (Addendum)
 Medication Instructions:  Your physician recommends that you continue on your current medications as directed. Please refer to the Current Medication list given to you today.  Labwork: None   Testing/Procedures: None   Follow-Up: Your physician recommends that you schedule a follow-up appointment in: 4-6 months   Any Other Special Instructions Will Be Listed Below (If Applicable).  If you need a refill on your cardiac medications before your next appointment, please call your pharmacy.

## 2024-05-19 NOTE — Progress Notes (Signed)
 Cardiology Office Note:  .   Date:  05/19/2024 ID:  GIOVANIE Dudley, DOB April 06, 1941, MRN [id number] PCP: [name], MD  [facility] Providers Cardiologist:  [name], MD Electrophysiologist:  [name], MD (Inactive)    History of Present Illness: .   Lucas Dudley is a 83 y.o. male with a PMH of CAD, chronic CHF, persistent A-fib, s/p Watchman implantation in 2023, AVM, HTN, T2DM, mixed HLD, OSA, and former smoker, who presents today for cardiac catheterization follow-up.   Last seen by Dr. Sierras on December 04, 2023.  At the time, patient noted gradually worsening DOE and fatigue over the last few months.  Was noted to have a 20 pound weight gain since last office visit in August 2024.  Carvedilol  was discontinued, Demadex  was increased to 40 mg twice daily for the next 3 to 4 days, then changed to standing dose of 40 mg in the morning and 20 mg in the afternoon.  Potassium supplement was started.  Echocardiogram was ordered to be updated.  Hospital stay in early March 2025 for acute hypoxic respiratory failure and acute on chronic CHF exacerbation.  CT scan was negative for acute PE.  Showed cardiomegaly with small pleural effusions, nonspecific mild mediastinal and right hilar adenopathy, scattered punctate pulmonary nodules measuring less than 5 mm.  Was recommended to consider noncontrast CT of the chest that could possibly consider in 12 months the patient was considered to be high risk.  TTE revealed EF 55 to 60%, moderate LVH, mildly reduced right ventricular systolic function, severely elevated PASP, estimated right ventricular systolic pressure at 62.6 mmHg.   Today presents for hospital follow-up.  He is also pending endoscopy later this month with GI. He states he had a recent fall at home after chair was slick and fell out of chair at home, neighbor came and helped him up. Denies any syncope, acute injuries, or head injuries. Says overall he is doing fairly well,  breathing better since leaving the hospital, but does admit to weakness, says he feels like he lost his upper body strength. Denies any chest pain, shortness of breath, palpitations, syncope, presyncope, dizziness, orthopnea, PND, swelling or significant weight changes, acute bleeding, or claudication.  Saw Dr. Sierras after hospital follow-up. Set up for heart cath - see report.   Hospitalized 01/13/2024 -  01/28/2024 for acute hypoxic respiratory failure and decompensated CHF, pulmonary HTN. Felt to have likely undiagnosed COPD/OSA contributing to pulm HTN. Did well with diuresis. Started on Jardiance. Ujnderwent RHC/LHC 01/27/2024 that showed nonobstructive CAD- see full report below. IV iron  by hem/onc.   See by HF clinic on 02/04/2024 - volume status appeared low. Was cut back on his diuretic. Stopped Amio. EKG that day showed A-fib, 42 bpm. Getting iron  infusions with Hem/Onc. sCr elevated to 2.25, potassium 5.7. Was instructed to stop Aldactone  and potassium, hold Torsemide  and then restart Demedex at 20 mg BID and to recheck labs in 1 week. Repeat labs were much better.   02/17/2024 - Today he presents for follow-up. He states he is doing better than last time he was seen in office. Says he seems to have a little bit more energy. Tells me he is compliant with his CPAP, but needs new straps for his machine. Denies any chest pain, shortness of breath, palpitations, syncope, presyncope, dizziness, orthopnea, PND, swelling or significant weight changes, acute bleeding, or claudication.  05/19/2024 -here for follow-up.  Tells me he has not been feeling well recently.  Admits to some ankle pain.  At times he notices some ankle swelling.  Currently is taking 1 tablet of torsemide  in the morning and 1 tablet of torsemide  in the afternoon, says sometimes he takes 2 tablets of torsemide  in the afternoon depending on his swelling. Denies any chest pain, shortness of breath, palpitations, syncope, presyncope,  dizziness, orthopnea, PND, significant weight changes, acute bleeding, or claudication.  ROS: Negative. See HPI.   Studies Reviewed: SABRA    EKG: EKG is not ordered today.   Echo limited 02/11/2024:   1. Limited study.   2. Left ventricular ejection fraction, by estimation, is 55 to 60%. The  left ventricle has normal function. The left ventricle has no regional  wall motion abnormalities. There is moderate concentric left ventricular  hypertrophy. Left ventricular diastolic parameters are indeterminate.   3. Right ventricular systolic function is mildly reduced. The right  ventricular size is normal. There is moderately elevated pulmonary artery systolic pressure. The estimated right ventricular systolic pressure is 47.4 mmHg.   4. A small pericardial effusion is present. The pericardial effusion is  posterior to the left ventricle.   5. Mild mitral valve regurgitation.   6. The aortic valve is tricuspid. There is mild calcification of the  aortic valve.   7. The inferior vena cava is normal in size with <50% respiratory  variability, suggesting right atrial pressure of 8 mmHg.   Comparison(s): A prior study was performed on 12/15/2023. Prior images  reviewed side by side. LVEF normal range at 55-60%. Mild RV dysfunction. Estimated RVSP of 47.4 mmHg has decreased in comparison.  Right/left heart cath 01/27/2024:    Prox RCA to Mid RCA lesion is 40% stenosed.   RPDA lesion is 60% stenosed.   HEMODYNAMICS: RA:       12 mmHg (mean) RV:       61/8, 12 mmHg PA:       63/17 mmHg (32 mean) PCWP: 16 mmHg (mean) with v waves to 26                                      Estimated Fick CO/CI   5.78L/min, 2.59L/min/m2                                            TPG  16  mmHg                                               PVR  2.7 Wood Units  PAPi  3.83       IMPRESSION: Moderate, nonobstructive coronary artery disease with mild progression from 2016. 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 Well compensated RV function. Normal cardiac output/index   RECOMMENDATIONS: Results conveyed to ordering cardiologist. Continued diuresis and evaluated for group III PH given extensive smoking history and OSA  Echo 12/2023:  1. Left ventricular ejection fraction, by estimation, is 55 to 60%. Left  ventricular ejection fraction by PLAX is 59 %. The left ventricle has  normal function. The left ventricle has  no regional wall motion  abnormalities. There is moderate concentric  left ventricular hypertrophy. Left ventricular diastolic parameters are  indeterminate.   2. Right ventricular systolic function is mildly reduced. The right  ventricular size is normal. There is severely elevated pulmonary artery  systolic pressure. The estimated right ventricular systolic pressure is  62.6 mmHg.   3. The mitral valve is grossly normal. No evidence of mitral valve  regurgitation.   4. The aortic valve is tricuspid. Aortic valve regurgitation is not  visualized. Aortic valve sclerosis/calcification is present, without any  evidence of aortic stenosis.   5. The inferior vena cava is dilated in size with <50% respiratory  variability, suggesting right atrial pressure of 15 mmHg.   Comparison(s): Changes from prior study are noted. 06/06/2022: LVEF 60-65%, mild LVH, low normal RV function.   CT cardiac 08/2022: IMPRESSION: 1.  Mild bi atrial enlargement   2.  Trans septal puncture site sealed with no shunt   3. 31 mm Watchman FLX with 1/3 medial shoulder and 2.2 mm medial gap. Device is well endothelialized at surface. Average compression 15% Distal 1/3 of device and LAA not thrombosed and accepts contrast   4.  No pericardial effusion   5.  Normal ascending thoracic aorta 3.4 cm   6.  Normal PV anatomy  IMPRESSION: 1. Mildly  prominent subcarinal lymph node, 1.6 cm in short axis, previously 2.1 cm on 05/31/2021. This is nonspecific but could be reactive. 2. Mild peripheral subpleural reticulation in the lungs, cannot exclude mild fibrosis. 3. Healing subacute right lateral rib fractures. Old left lateral lower rib fractures. 4. Scattered mild scarring or atelectasis and peripheral subpleural reticulation, unchanged from prior, cannot exclude mild fibrosis. 5. Descending thoracic aortic atherosclerotic vascular disease. 6. Mild peripheral scarring or atelectasis in both lungs. 7. Mild mediastinal lipomatosis. This is a benign incidental finding. 8. Aortic atherosclerosis.   Aortic Atherosclerosis (ICD10-I70.0).  Lexiscan  01/2020:  Nuclear stress EF: 31%. There was no ST segment deviation noted during stress. Findings consistent with prior myocardial infarction. This is a high risk study. The left ventricular ejection fraction is moderately decreased (30-44%).   Low exercise tolerance (3 METs), unable to reach target heart rate, so study converted to pharmacologic. Baseline blood pressure very elevated at baseline (198/96). There are diffuse perfusion defects at rest and with stress, sparing only the mid-lateral wall. There is no significant reversibility on this study. Based on distribution, would be concerned that this is artifact. However, with low EF, suggests diffuse areas of poor perfusion without significant reversibility.   LHC 03/2015:  IMPRESSION:Mr. Croswell has noncritical CAD with severe LV dysfunction. I think that his inferior perfusion abnormality was artifactual. I do not think his moderate proximal LAD lesion is hemostatically significant. 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 The patient received 5000 units of heparin  intravenously. He received radial cocktail. The SideArm sheath. A total of 55 mL of contrast was used for the case. The sheath was  removed and a TR band was placed on the right wrist to achieve patent hemostasis. The patient left the lab in stable condition. He'll be discharged home in 2 hours and will follow-up with me in the office in several weeks.  Cardiac monitor 02/2015: Paroxysmal atrial fib   The patient was in atrial fib for a significant amount of the monitored time. Many days he had atrial fib for almost the entire day.   He had several pauses of >  2 seconds.  No prolonged pauses that would cause syncope  Physical Exam:   VS:  BP 120/68   Pulse 61   Ht 5' 10 (1.778 m)   Wt 225 lb (102.1 kg)   SpO2 97%   BMI 32.28 kg/m    Wt Readings from Last 3 Encounters:  05/20/24 222 lb 9.6 oz (101 kg)  05/19/24 225 lb (102.1 kg)  04/09/24 229 lb 3.2 oz (104 kg)    GEN: Obese, 83 y.o. male in no acute distress NECK: No JVD; No carotid bruits CARDIAC: S1/S2, RRR, no murmurs, rubs, gallops RESPIRATORY:  Clear to auscultation without rales, wheezing or rhonchi  ABDOMEN: Soft, non-tender, non-distended EXTREMITIES:  No edema; No deformity   ASSESSMENT AND PLAN: .    1. HFpEF, pulmonary hypertension Stage C, NYHA class I-II symptoms. EF 55-60% 01/2024. Recent Echo revealed moderately elevated pulmonary pressures, etiology most likely d/t group II/III. Recent right and left heart cath noted above. Recommended to continue diuresis and to evaluate for group 3 PH given his OSA and extensive smoking hx. Euvolemic and well compensated on exam.  Continue torsemide  and Farxiga . Low sodium diet, fluid restriction <2L, and daily weights encouraged. Educated to contact our office for weight gain of 2 lbs overnight or 5 lbs in one week.   3. CAD Hx of occluded first diagonal and moderate LAD/PDA dx, has been medically managed.  Most recent heart cath in April 2025 revealed moderate, nonobstructive CAD with mild progression from 2016.  No intervention performed.  Stable with no anginal symptoms. No medication changes at this time.  Heart healthy diet and regular cardiovascular exercise encouraged.   4. Persistent A-fib, s/p Watchman implantation in 2023 Denies any tachycardia or palpitations. HR is well controlled without AV nodal blockers. Doing well s/p Watchman implantation in 2023. Continue ASA, not on OAC.  Heart healthy diet and regular cardiovascular exercise encouraged. Continue to follow-up with EP.   5. HTN BP well controlled. Discussed to monitor BP at home at least 2 hours after medications and sitting for 5-10 minutes. No medication changes at this time. Heart healthy diet and regular cardiovascular exercise encouraged.   6. HLD LDL 83 01/2024. Continue current medication regimen. Heart healthy diet and regular cardiovascular exercise encouraged.   7. OSA on CPAP Recent right and left heart cath noted above. Recommended to continue diuresis and to evaluate for group 3 PH given his OSA and extensive smoking hx. Encouraged continued compliance with CPAP. Continue to follow-up with Dr. Shlomo.   8. CKD stage 3b Most recent labs show overall stable kidney function.  Avoid nephrotoxic agents.  No medication changes at this time.  Continue follow-up with PCP.  Dispo: Will provide refills per his request.  Follow-up with me/APP in 4-6 months or sooner if anything changes.   Signed, Almarie Crate, NP

## 2024-05-20 ENCOUNTER — Ambulatory Visit: Admitting: Internal Medicine

## 2024-05-20 ENCOUNTER — Encounter: Payer: Self-pay | Admitting: Internal Medicine

## 2024-05-20 VITALS — BP 136/68 | HR 74 | Ht 70.0 in | Wt 222.6 lb

## 2024-05-20 DIAGNOSIS — E1169 Type 2 diabetes mellitus with other specified complication: Secondary | ICD-10-CM | POA: Diagnosis not present

## 2024-05-20 DIAGNOSIS — N1831 Chronic kidney disease, stage 3a: Secondary | ICD-10-CM | POA: Diagnosis not present

## 2024-05-20 DIAGNOSIS — J309 Allergic rhinitis, unspecified: Secondary | ICD-10-CM | POA: Diagnosis not present

## 2024-05-20 DIAGNOSIS — I5042 Chronic combined systolic (congestive) and diastolic (congestive) heart failure: Secondary | ICD-10-CM

## 2024-05-20 DIAGNOSIS — D500 Iron deficiency anemia secondary to blood loss (chronic): Secondary | ICD-10-CM

## 2024-05-20 DIAGNOSIS — M1A00X0 Idiopathic chronic gout, unspecified site, without tophus (tophi): Secondary | ICD-10-CM | POA: Insufficient documentation

## 2024-05-20 DIAGNOSIS — R0609 Other forms of dyspnea: Secondary | ICD-10-CM

## 2024-05-20 DIAGNOSIS — G4733 Obstructive sleep apnea (adult) (pediatric): Secondary | ICD-10-CM

## 2024-05-20 DIAGNOSIS — Z794 Long term (current) use of insulin: Secondary | ICD-10-CM

## 2024-05-20 MED ORDER — COLCHICINE 0.6 MG PO TABS: ORAL_TABLET | ORAL | 1 refills | Status: DC

## 2024-05-20 MED ORDER — FLUTICASONE PROPIONATE 50 MCG/ACT NA SUSP: 1.0000 | Freq: Every day | NASAL | 1 refills | Status: DC

## 2024-05-20 MED ORDER — ALBUTEROL SULFATE HFA 108 (90 BASE) MCG/ACT IN AERS
2.0000 | INHALATION_SPRAY | Freq: Four times a day (QID) | RESPIRATORY_TRACT | 3 refills | Status: AC | PRN
Start: 2024-05-20 — End: ?

## 2024-05-20 NOTE — Assessment & Plan Note (Signed)
 Due to Eliquis  in the past Hb stable ~12 now Has had iron  transfusions Continue oral iron  supplement Followed by Hematology

## 2024-05-20 NOTE — Assessment & Plan Note (Signed)
 Likely related to type II DM Last BMP reviewed, GFR stays around 45-50 Has history of CHF, on torsemide  20 mg BID Was on ACE inhibitor, had to be discontinued due to AKI Advised to maintain adequate hydration

## 2024-05-20 NOTE — Patient Instructions (Addendum)
 Please continue to take medications as prescribed.  Please continue to follow low carb diet and ambulate as tolerated.  Please take Tylenol  arthritis as needed for back pain.

## 2024-05-20 NOTE — Assessment & Plan Note (Signed)
Uses CPAP regularly, continues to benefit from it 

## 2024-05-20 NOTE — Assessment & Plan Note (Addendum)
 On torsemide  20 mg BID now, he reports improved leg swelling Daily weight checks Followed by cardiology Held Coreg  due to bradycardia and lisinopril  due to AKI by Cardiology On Farxiga  10 mg daily

## 2024-05-20 NOTE — Assessment & Plan Note (Signed)
 Has had gout flareups at times, likely related to diet Refilled colchicine  to be used as needed for acute flareups Check uric acid level

## 2024-05-20 NOTE — Progress Notes (Signed)
 Established Patient Office Visit  Subjective:  Patient ID: Lucas Dudley, male    DOB: 01-11-1941  Age: 83 y.o. MRN: [id number]  CC:  Chief Complaint  Patient presents with   Medical Management of Chronic Issues    3 month f/u    Hip Pain    Pt reports sx of hip and back pain on his right side.     HPI Lucas Dudley is a 83 y.o. male with past medical history of HTN, CAD, HFrEF, A-fib s/p watchman device, type II DM with retinopathy, HLD, OSA, prostate ca. and morbid obesity who presents for f/u of chronic medical conditions.  He has had hospitalizations for acute decompensated HFpEF.  He needs aggressive diuresis for it, currently takes torsemide  20 mg BID.  He had cardiac cath, which showed nonobstructive CAD. His hydralazine  was held due to better controlled blood pressure.  Metformin  was discontinued due to AKI on CKD.  HTN, CAD, A Fib and CHF: His BP was WNL today. He takes Torsemide  20 mg BID now.  Coreg  and lisinopril  have been discontinued during recent hospitalizations.  He has watchman device in place due to history of A-fib. Takes Aspirin  for h/o CAD. Followed by Cardiology. He reports improvement in chronic leg swelling and orthopnea.  Type 2 DM: He takes Basaglar  42 U qHS and Glimepiride  2 mg BID. He had to start taking Glipizide BID due to hyperglycemia. He has tried Ozempic in the past, but had supply issues. He denies trying Ozempic again. He checks blood glucose regularly in AM, which have been mostly around 120-150 with some blood glucose readings near 70s, but likely has hyperglycemia during the day. He had myalgias with statin, takes Gemfibrozil  currently. He is followed by Retina specialist for diabetic retinopathy.  He uses CPAP regularly for OSA.  Iron  deficiency anemia: He has history of GI bleeding due to Eliquis , leading to iron  deficiency anemia.  He has had iron  transfusions in the past.  Of note, his Hb has been stable around 12 now.  He is getting iron   transfusions at hematology clinic.  He denies melena or hematochezia currently.  He takes half tablet of iron  supplement as he has bloating and constipation with whole tablet.  He takes pantoprazole  40 mg QD for GERD.  He reports bloating and epigastric discomfort after eating.  Gout: He has had gout flareups with shrimp and red meat intake. He takes Colchicine  as needed and requests refill of it. Last flareup was in the last week on right 5th finger.  He states that he has had pneumococcal vaccine - details unknown.  Past Medical History:  Diagnosis Date   Arthritis    Atrial fibrillation (HCC)    CAD (coronary artery disease)    a. Cath 03/17/15 showing 100% ostial D1, 50% prox LAD to mid LAD, 40% RPDA stenosis. Med rx. // Myoview  01/2020: EF 31 diffuse perfusion defect without reversibility (suspect artifact); reviewed with Dr. Suellen study felt to be low risk   Cataract    Mixed form OD   Chronic diastolic CHF (congestive heart failure) (HCC)    Chronically elevated hemidiaphragm - Right Side    Diabetic retinopathy (HCC)    NPDR OU   Essential hypertension    Glaucoma    POAG OU   History of gout    Hyperlipidemia    Hypertensive retinopathy    OU   Kidney stones    Melanoma of neck (HCC)    NICM (nonischemic cardiomyopathy) (  HCC)    OSA on CPAP 2012   Prostate cancer (HCC)    Type II diabetes mellitus (HCC)     Past Surgical History:  Procedure Laterality Date   ABDOMINAL HERNIA REPAIR     w/mesh   ATRIAL FIBRILLATION ABLATION N/A 06/06/2021   Procedure: ATRIAL FIBRILLATION ABLATION;  Surgeon: Kelsie Agent, MD;  Location: [facility];  Service: Cardiovascular;  Laterality: N/A;   BIOPSY  05/24/2022   Procedure: BIOPSY;  Surgeon: Shaaron Lamar HERO, MD;  Location: [facility];  Service: Endoscopy;;   BIOPSY  05/30/2022   Procedure: BIOPSY;  Surgeon: Shaaron Lamar HERO, MD;  [facility]: [facility];  Service: Endoscopy;;   BIOPSY  01/10/2023   Procedure: BIOPSY;   Surgeon: Wilhelmenia Aloha Raddle., MD;  Location: [facility];  Service: Gastroenterology;;   CARDIAC CATHETERIZATION N/A 03/17/2015   Procedure: Left Heart Cath and Coronary Angiography;  Surgeon: Dorn JINNY Lesches, MD;  Location: [facility] INVASIVE CV LAB;  Service: Cardiovascular;  Laterality: N/A;   CARDIOVERSION N/A 08/09/2021   Procedure: CARDIOVERSION;  Surgeon: Raford Riggs, MD;  Location: [facility] ENDOSCOPY;  Service: Cardiovascular;  Laterality: N/A;   CARDIOVERSION N/A 11/10/2021   Procedure: CARDIOVERSION;  Surgeon: Sheena Pugh, DO;  Location: [facility];  Service: Cardiovascular;  Laterality: N/A;   carotid doppler  09/17/2008   rigt and left ICAs 0-49%;mildly  abnormal   CATARACT EXTRACTION Left 2020   Dr. Lelon   COLONOSCOPY N/A 05/16/2017   Procedure: COLONOSCOPY;  Surgeon: Golda Claudis PENNER, MD;  [facility]: [facility];  Service: Endoscopy;  Laterality: N/A;  930   COLONOSCOPY WITH PROPOFOL  N/A 05/24/2022   Procedure: COLONOSCOPY WITH PROPOFOL ;  Surgeon: Shaaron Lamar HERO, MD;  [facility]: [facility];  Service: Endoscopy;  Laterality: N/A;   DOPPLER ECHOCARDIOGRAPHY  05/25/2009   EF 50-55%,LA mildly dilated, LV function normal   ELECTROPHYSIOLOGIC STUDY N/A 04/05/2015   Procedure: Cardioversion;  Surgeon: Jerel Balding, MD;  Location: [facility];  Service: Cardiovascular;  Laterality: N/A;   ELECTROPHYSIOLOGIC STUDY N/A 09/06/2015   Procedure: Atrial Fibrillation Ablation;  Surgeon: Agent Kelsie, MD;  Location: [facility] INVASIVE CV LAB;  Service: Cardiovascular;  Laterality: N/A;   ELECTROPHYSIOLOGIC STUDY N/A 07/12/2016   redo afib ablation by Dr Kelsie   ENTEROSCOPY N/A 05/30/2022   Procedure: ENTEROSCOPY;  Surgeon: Shaaron Lamar HERO, MD;  [facility];  Service: Endoscopy;  Laterality: N/A;   ENTEROSCOPY N/A 01/07/2024   Procedure: ENTEROSCOPY;  Surgeon: Eartha Angelia Sieving, MD;  [facility];  Service: Gastroenterology;  Laterality: N/A;  1:45PM;ASA 3    ESOPHAGOGASTRODUODENOSCOPY (EGD) WITH PROPOFOL  N/A 05/24/2022   Procedure: ESOPHAGOGASTRODUODENOSCOPY (EGD) WITH PROPOFOL ;  Surgeon: Shaaron Lamar HERO, MD;  [facility];  Service: Endoscopy;  Laterality: N/A;   ESOPHAGOGASTRODUODENOSCOPY (EGD) WITH PROPOFOL  N/A 01/10/2023   Procedure: ESOPHAGOGASTRODUODENOSCOPY (EGD) WITH PROPOFOL ;  Surgeon: Wilhelmenia Aloha Raddle., MD;  Location: [facility];  Service: Gastroenterology;  Laterality: N/A;   EUS N/A 01/10/2023   Procedure: UPPER ENDOSCOPIC ULTRASOUND (EUS) RADIAL;  Surgeon: Wilhelmenia Aloha Raddle., MD;  Location: [facility];  Service: Gastroenterology;  Laterality: N/A;   EXCISIONAL HEMORRHOIDECTOMY     inside and out   EYE SURGERY Left 2020   Cat Sx - Dr. Lelon   FINE NEEDLE ASPIRATION Right    knee; drew ~ 1 quart off   GIVENS CAPSULE STUDY N/A 05/25/2022   Procedure: GIVENS CAPSULE STUDY;  Surgeon: Cindie Carlin POUR, DO;  [facility];  Service: Endoscopy;  Laterality: N/A;   GIVENS CAPSULE STUDY N/A 01/23/2023   Procedure: GIVENS CAPSULE STUDY;  Surgeon: Eartha Angelia Sieving, MD;  [facility]: [facility];  Service: Gastroenterology;  Laterality: N/A;  8:30am   HERNIA REPAIR     HOT HEMOSTASIS N/A 01/10/2023   Procedure: HOT HEMOSTASIS (ARGON PLASMA COAGULATION/BICAP);  Surgeon: Wilhelmenia Aloha Raddle., MD;  Location: [facility];  Service: Gastroenterology;  Laterality: N/A;   HOT HEMOSTASIS  01/07/2024   Procedure: EGD, WITH ARGON PLASMA COAGULATION;  Surgeon: Eartha Angelia Sieving, MD;  [facility];  Service: Gastroenterology;;   LAPAROSCOPIC CHOLECYSTECTOMY     LEFT ATRIAL APPENDAGE OCCLUSION N/A 06/28/2022   Procedure: LEFT ATRIAL APPENDAGE OCCLUSION;  Surgeon: Wonda Sharper, MD;  Location: [facility] INVASIVE CV LAB;  Service: Cardiovascular;  Laterality: N/A;   MELANOMA EXCISION Right    neck   NM MYOCAR PERF WALL MOTION  02/21/2012   EF 61% ,EXERCISE 7 METS. exercise stopped due to wheezing  and shortness of breathe   POLYPECTOMY  05/16/2017   Procedure: POLYPECTOMY;  Surgeon: Golda Claudis PENNER, MD;  [facility];  Service: Endoscopy;;  colon   POLYPECTOMY  05/24/2022   Procedure: POLYPECTOMY;  Surgeon: Shaaron Lamar HERO, MD;  [facility];  Service: Endoscopy;;   POLYPECTOMY  01/10/2023   Procedure: POLYPECTOMY;  Surgeon: Wilhelmenia Aloha Raddle., MD;  Location: [facility];  Service: Gastroenterology;;   PROSTATECTOMY     RIGHT/LEFT HEART CATH AND CORONARY ANGIOGRAPHY N/A 01/27/2024   Procedure: RIGHT/LEFT HEART CATH AND CORONARY ANGIOGRAPHY;  Surgeon: Zenaida Morene PARAS, MD;  Location: [facility] INVASIVE CV LAB;  Service: Cardiovascular;  Laterality: N/A;   SHOULDER OPEN ROTATOR CUFF REPAIR Right X 2   SUBMUCOSAL TATTOO INJECTION  05/30/2022   Procedure: SUBMUCOSAL TATTOO INJECTION;  Surgeon: Shaaron Lamar HERO, MD;  [facility];  Service: Endoscopy;;   SUBMUCOSAL TATTOO INJECTION  01/10/2023   Procedure: SUBMUCOSAL TATTOO INJECTION;  Surgeon: Wilhelmenia Aloha Raddle., MD;  Location: [facility];  Service: Gastroenterology;;   TEE WITHOUT CARDIOVERSION N/A 09/05/2015   Procedure: TRANSESOPHAGEAL ECHOCARDIOGRAM (TEE);  Surgeon: Wilbert JONELLE Bihari, MD;  Location: [facility] ENDOSCOPY;  Service: Cardiovascular;  Laterality: N/A;   TEE WITHOUT CARDIOVERSION N/A 06/28/2022   Procedure: TRANSESOPHAGEAL ECHOCARDIOGRAM (TEE);  Surgeon: Wonda Sharper, MD;  Location: [facility] INVASIVE CV LAB;  Service: Cardiovascular;  Laterality: N/A;    Family History  Problem Relation Age of Onset   Heart disease Mother    Lung cancer Mother    Heart attack Mother 45   Diabetes Father    Heart disease Father    Stroke Brother    Healthy Daughter    Colon cancer Maternal Aunt        72s    Social History   Socioeconomic History   Marital status: Widowed    Spouse name: Not on file   Number of children: 1   Years of education: Not on file   Highest education level: GED or equivalent   Occupational History   Occupation: Retired  Tobacco Use   Smoking status: Former    Current packs/day: 0.00    Average packs/day: 2.0 packs/day for 27.0 years (54.0 ttl pk-yrs)    Types: Cigarettes    Start date: 89    Quit date: 1992    Years since quitting: 33.6    Passive exposure: Current   Smokeless tobacco: Never   Tobacco comments:    Former smoker 09/27/21  Vaping  Use   Vaping status: Never Used  Substance and Sexual Activity   Alcohol use: No    Alcohol/week: 0.0 standard drinks of alcohol    Comment: used to drink; stopped ~ 2008   Drug use: No   Sexual activity: Not Currently  Other Topics Concern   Not on file  Social History Narrative   Lives in [place], [place] with wife.   Social Drivers of Corporate investment banker Strain: Low Risk  (02/10/2024)   Overall Financial Resource Strain (CARDIA)    Difficulty of Paying Living Expenses: Not very hard  Food Insecurity: No Food Insecurity (02/10/2024)   Hunger Vital Sign    Worried About Running Out of Food in the Last Year: Never true    Ran Out of Food in the Last Year: Never true  Transportation Needs: No Transportation Needs (02/10/2024)   PRAPARE - Administrator, Civil Service (Medical): No    Lack of Transportation (Non-Medical): No  Physical Activity: Inactive (02/10/2024)   Exercise Vital Sign    Days of Exercise per Week: 0 days    Minutes of Exercise per Session: 0 min  Stress: No Stress Concern Present (02/10/2024)   [employer] of Occupational Health - Occupational Stress Questionnaire    Feeling of Stress : Only a little  Social Connections: Moderately Integrated (02/10/2024)   Social Connection and Isolation Panel    Frequency of Communication with Friends and Family: More than three times a week    Frequency of Social Gatherings with Friends and Family: More than three times a week    Attends Religious Services: More than 4 times per year    Active Member of [employer] or  Organizations: Yes    Attends Banker Meetings: More than 4 times per year    Marital Status: Widowed  Recent Concern: Social Connections - Moderately Isolated (11/20/2023)   Social Connection and Isolation Panel    Frequency of Communication with Friends and Family: More than three times a week    Frequency of Social Gatherings with Friends and Family: Once a week    Attends Religious Services: More than 4 times per year    Active Member of [employer] or Organizations: No    Attends Banker Meetings: Never    Marital Status: Widowed  Intimate Partner Violence: Not At Risk (01/29/2024)   Humiliation, Afraid, Rape, and Kick questionnaire    Fear of Current or Ex-Partner: No    Emotionally Abused: No    Physically Abused: No    Sexually Abused: No    Outpatient Medications Prior to Visit  Medication Sig Dispense Refill   ammonium lactate  (AMLACTIN DAILY) 12 % lotion Apply 1 Application topically as needed. 400 g 0   aspirin  81 MG chewable tablet Chew 81 mg by mouth daily.     camphor-menthol (ANTI-ITCH) lotion Apply 1 Application topically daily as needed for itching.     COMBIGAN  0.2-0.5 % ophthalmic solution Place 1 drop into both eyes 2 (two) times daily.     dapagliflozin  propanediol (FARXIGA ) 10 MG TABS tablet Take 1 tablet (10 mg total) by mouth daily. 90 tablet 1   diphenhydramine -acetaminophen  (TYLENOL  PM) 25-500 MG TABS tablet Take 1 tablet by mouth at bedtime.     ELDERBERRY PO Take 2 tablets by mouth daily. Chewable     ferrous sulfate  325 (65 FE) MG tablet Take 1 tablet (325 mg total) by mouth daily with breakfast. 120 tablet 1  gemfibrozil  (LOPID ) 600 MG tablet Take 600 mg by mouth at bedtime.      glimepiride  (AMARYL ) 2 MG tablet Take 1 tablet (2 mg total) by mouth daily with breakfast. (Patient taking differently: Take 2 mg by mouth at bedtime.) 30 tablet 0   Insulin  Glargine (BASAGLAR  KWIKPEN) 100 UNIT/ML Inject 45 Units into the skin at bedtime.  (Patient taking differently: Inject 42 Units into the skin at bedtime.) 15 mL 3   magnesium  gluconate (MAGONATE) 500 MG tablet Take 500 mg by mouth daily.     mineral oil liquid Take 15 mLs by mouth daily as needed for moderate constipation.     Omega-3 Fatty Acids (FISH OIL EXTRA STRENGTH) 1200 MG CAPS Take 1 capsule by mouth daily.     OVER THE COUNTER MEDICATION Take 1 tablet by mouth daily. Beets Chew     pantoprazole  (PROTONIX ) 40 MG tablet TAKE 1 TABLET BY MOUTH EVERY DAY 90 tablet 1   torsemide  (DEMADEX ) 20 MG tablet 40 MG IN THE MORNING AND 20 MG IN THE EVENING (Patient taking differently: Take 20 mg by mouth 2 (two) times daily. With an extra tablet later in the day if needed) 270 tablet 1   vitamin E 45 MG (100 UNITS) capsule Take 100 Units by mouth daily.     zinc  gluconate 50 MG tablet Take 50 mg by mouth daily.     albuterol  (VENTOLIN  HFA) 108 (90 Base) MCG/ACT inhaler Inhale 2 puffs into the lungs every 6 (six) hours as needed for wheezing or shortness of breath.     fluticasone  (FLONASE ) 50 MCG/ACT nasal spray SPRAY 2 SPRAYS INTO EACH NOSTRIL EVERY DAY (Patient taking differently: Place 2 sprays into both nostrils daily.) 48 mL 1   sulfamethoxazole -trimethoprim  (BACTRIM  DS) 800-160 MG tablet Take 1 tablet by mouth 2 (two) times daily. 10 tablet 0   No facility-administered medications prior to visit.    Allergies  Allergen Reactions   Azithromycin  Nausea Only   Tape Rash and Other (See Comments)    Causes skin redness, Use paper tape only.    ROS Review of Systems  Constitutional:  Positive for fatigue. Negative for chills and fever.  HENT:  Negative for congestion and sore throat.   Eyes:  Negative for pain and discharge.  Respiratory:  Positive for shortness of breath (Intermittent). Negative for cough.   Cardiovascular:  Positive for leg swelling. Negative for chest pain and palpitations.  Gastrointestinal:  Negative for diarrhea, nausea and vomiting.  Endocrine:  Negative for polydipsia and polyuria.  Genitourinary:  Negative for dysuria and hematuria.  Musculoskeletal:  Negative for neck pain and neck stiffness.  Skin:  Negative for rash.  Neurological:  Negative for dizziness and weakness.  Psychiatric/Behavioral:  Negative for agitation and behavioral problems.       Objective:    Physical Exam Vitals reviewed.  Constitutional:      General: He is not in acute distress.    Appearance: He is obese. He is not diaphoretic.  HENT:     Head: Normocephalic and atraumatic.     Nose: No congestion.     Mouth/Throat:     Mouth: Mucous membranes are moist.  Eyes:     General: No scleral icterus.    Extraocular Movements: Extraocular movements intact.  Cardiovascular:     Rate and Rhythm: Normal rate and regular rhythm.     Heart sounds: Normal heart sounds. No murmur heard. Pulmonary:     Breath sounds: Normal breath sounds.  No wheezing or rales.  Musculoskeletal:     Cervical back: Neck supple. No tenderness.     Right lower leg: Edema (Mild) present.     Left lower leg: Edema (Mild) present.  Skin:    General: Skin is warm.     Findings: Rash (Stasis dermatitis over bilateral legs) present.  Neurological:     General: No focal deficit present.     Mental Status: He is alert and oriented to person, place, and time.     Sensory: No sensory deficit.     Motor: No weakness.  Psychiatric:        Mood and Affect: Mood normal.        Behavior: Behavior normal.     BP 136/68 (BP Location: Right Arm)   Pulse 74   Ht 5' 10 (1.778 m)   Wt 222 lb 9.6 oz (101 kg)   SpO2 100%   BMI 31.94 kg/m  Wt Readings from Last 3 Encounters:  05/20/24 222 lb 9.6 oz (101 kg)  05/19/24 225 lb (102.1 kg)  04/09/24 229 lb 3.2 oz (104 kg)    Lab Results  Component Value Date   TSH 3.037 05/21/2022   Lab Results  Component Value Date   WBC 7.4 03/10/2024   HGB 12.3 (L) 03/10/2024   HCT 39.0 03/10/2024   MCV 90.5 03/10/2024   PLT 334  03/10/2024   Lab Results  Component Value Date   NA 132 (L) 02/12/2024   K 5.0 02/12/2024   CO2 25 02/12/2024   GLUCOSE 258 (H) 02/12/2024   BUN 28 (H) 02/12/2024   CREATININE 1.57 (H) 02/12/2024   BILITOT 0.7 01/13/2024   ALKPHOS 52 01/13/2024   AST 28 01/13/2024   ALT 20 01/13/2024   PROT 7.5 01/13/2024   ALBUMIN 3.0 (L) 01/28/2024   CALCIUM  9.2 02/12/2024   ANIONGAP 13 02/12/2024   EGFR 40 (L) 12/30/2023   GFR 76.23 05/04/2015   Lab Results  Component Value Date   CHOL 133 01/24/2024   Lab Results  Component Value Date   HDL 26 (L) 01/24/2024   Lab Results  Component Value Date   LDLCALC 89 01/24/2024   Lab Results  Component Value Date   TRIG 91 01/24/2024   Lab Results  Component Value Date   CHOLHDL 5.1 01/24/2024   Lab Results  Component Value Date   HGBA1C 7.6 (H) 02/12/2024      Assessment & Plan:   Problem List Items Addressed This Visit       Cardiovascular and Mediastinum   Chronic combined systolic and diastolic CHF (congestive heart failure) (HCC) - Primary   On torsemide  20 mg BID now, he reports improved leg swelling Daily weight checks Followed by cardiology Held Coreg  due to bradycardia and lisinopril  due to AKI by Cardiology On Farxiga  10 mg daily        Respiratory   OSA on CPAP   Uses CPAP regularly, continues to benefit from it      Allergic rhinitis   Chronic nasal congestion Refilled Flonase       Relevant Medications   fluticasone  (FLONASE ) 50 MCG/ACT nasal spray     Endocrine   Type 2 diabetes mellitus with other specified complication (HCC)   Lab Results  Component Value Date   HGBA1C 7.6 (H) 02/12/2024   Well-controlled for his age Associated with diabetic retinopathy, HTN, CAD and HLD On Basaglar  42 U at bedtime, Farxiga  10 mg QD and Glimepiride  2 mg BID  Does not want to start GLP1 agonist Advised to follow diabetic diet On ACEi F/u CMP and lipid panel Diabetic eye exam: Advised to follow up with  Ophthalmology for diabetic eye exam      Relevant Orders   CMP14+EGFR   Hemoglobin A1c     Genitourinary   CKD stage 3a, GFR 45-59 ml/min (HCC) - baseline Scr 1.2-1.4   Likely related to type II DM Last BMP reviewed, GFR stays around 45-50 Has history of CHF, on torsemide  20 mg BID Was on ACE inhibitor, had to be discontinued due to AKI Advised to maintain adequate hydration      Relevant Orders   CBC with Differential/Platelet   CMP14+EGFR     Other   Dyspnea on exertion   Has a history of CAD, A-fib and CHF Currently appears euvolemic, on torsemide  20 mg twice daily Also has recurrent bronchitis, uses albuterol  as needed for dyspnea, refilled      Relevant Medications   albuterol  (VENTOLIN  HFA) 108 (90 Base) MCG/ACT inhaler   Anemia due to chronic blood loss   Due to Eliquis  in the past Hb stable ~12 now Has had iron  transfusions Continue oral iron  supplement Followed by Hematology      Idiopathic chronic gout without tophus   Has had gout flareups at times, likely related to diet Refilled colchicine  to be used as needed for acute flareups Check uric acid level      Relevant Medications   colchicine  0.6 MG tablet   Other Relevant Orders   Uric acid       Meds ordered this encounter  Medications   fluticasone  (FLONASE ) 50 MCG/ACT nasal spray    Sig: Place 1 spray into both nostrils daily. SPRAY 2 SPRAYS INTO EACH NOSTRIL EVERY DAY    Dispense:  48 mL    Refill:  1    Dx code-j30.9   albuterol  (VENTOLIN  HFA) 108 (90 Base) MCG/ACT inhaler    Sig: Inhale 2 puffs into the lungs every 6 (six) hours as needed for wheezing or shortness of breath.    Dispense:  18 g    Refill:  3   colchicine  0.6 MG tablet    Sig: Take 2 tablets on Day 1, followed by 1 tablet 1 hour later. Please take 1 tablet once daily from Day 2 till swelling improves.    Dispense:  20 tablet    Refill:  1    Follow-up: Return in about 4 months (around 09/19/2024) for Annual physical.     Suzzane MARLA Blanch, MD

## 2024-05-20 NOTE — Assessment & Plan Note (Addendum)
 Lab Results  Component Value Date   HGBA1C 7.6 (H) 02/12/2024   Well-controlled for his age Associated with diabetic retinopathy, HTN, CAD and HLD On Basaglar  42 U at bedtime, Farxiga  10 mg QD and Glimepiride  2 mg BID  Does not want to start GLP1 agonist Advised to follow diabetic diet On ACEi F/u CMP and lipid panel Diabetic eye exam: Advised to follow up with Ophthalmology for diabetic eye exam

## 2024-05-20 NOTE — Assessment & Plan Note (Signed)
 Chronic nasal congestion Refilled Flonase 

## 2024-05-20 NOTE — Assessment & Plan Note (Signed)
 Has a history of CAD, A-fib and CHF Currently appears euvolemic, on torsemide  20 mg twice daily Also has recurrent bronchitis, uses albuterol  as needed for dyspnea, refilled

## 2024-05-21 ENCOUNTER — Ambulatory Visit: Payer: Self-pay | Admitting: Internal Medicine

## 2024-05-21 ENCOUNTER — Encounter: Payer: Self-pay | Admitting: Podiatry

## 2024-05-21 ENCOUNTER — Ambulatory Visit (INDEPENDENT_AMBULATORY_CARE_PROVIDER_SITE_OTHER): Admitting: Podiatry

## 2024-05-21 DIAGNOSIS — B351 Tinea unguium: Secondary | ICD-10-CM

## 2024-05-21 DIAGNOSIS — M79674 Pain in right toe(s): Secondary | ICD-10-CM | POA: Diagnosis not present

## 2024-05-21 DIAGNOSIS — Z794 Long term (current) use of insulin: Secondary | ICD-10-CM | POA: Diagnosis not present

## 2024-05-21 DIAGNOSIS — M79675 Pain in left toe(s): Secondary | ICD-10-CM | POA: Diagnosis not present

## 2024-05-21 DIAGNOSIS — E1169 Type 2 diabetes mellitus with other specified complication: Secondary | ICD-10-CM

## 2024-05-21 LAB — CMP14+EGFR
ALT: 10 IU/L (ref 0–44)
AST: 20 IU/L (ref 0–40)
Albumin: 4.1 g/dL (ref 3.7–4.7)
Alkaline Phosphatase: 146 IU/L — ABNORMAL HIGH (ref 44–121)
BUN/Creatinine Ratio: 16 (ref 10–24)
BUN: 17 mg/dL (ref 8–27)
Bilirubin Total: 0.3 mg/dL (ref 0.0–1.2)
CO2: 27 mmol/L (ref 20–29)
Calcium: 8.7 mg/dL (ref 8.6–10.2)
Chloride: 92 mmol/L — ABNORMAL LOW (ref 96–106)
Creatinine, Ser: 1.07 mg/dL (ref 0.76–1.27)
Globulin, Total: 3 g/dL (ref 1.5–4.5)
Glucose: 297 mg/dL — ABNORMAL HIGH (ref 70–99)
Potassium: 3.2 mmol/L — ABNORMAL LOW (ref 3.5–5.2)
Sodium: 137 mmol/L (ref 134–144)
Total Protein: 7.1 g/dL (ref 6.0–8.5)
eGFR: 70 mL/min/1.73 (ref >59)

## 2024-05-21 LAB — CBC WITH DIFFERENTIAL/PLATELET
Basophils Absolute: 0 x10E3/uL (ref 0.0–0.2)
Basos: 1 %
EOS (ABSOLUTE): 0.2 x10E3/uL (ref 0.0–0.4)
Eos: 3 %
Hematocrit: 34.6 % — ABNORMAL LOW (ref 37.5–51.0)
Hemoglobin: 10.9 g/dL — ABNORMAL LOW (ref 13.0–17.7)
Immature Grans (Abs): 0 x10E3/uL (ref 0.0–0.1)
Immature Granulocytes: 0 %
Lymphocytes Absolute: 1.5 x10E3/uL (ref 0.7–3.1)
Lymphs: 21 %
MCH: 28.1 pg (ref 26.6–33.0)
MCHC: 31.5 g/dL (ref 31.5–35.7)
MCV: 89 fL (ref 79–97)
Monocytes Absolute: 0.7 x10E3/uL (ref 0.1–0.9)
Monocytes: 9 %
Neutrophils Absolute: 4.8 x10E3/uL (ref 1.4–7.0)
Neutrophils: 66 %
Platelets: 320 x10E3/uL (ref 150–450)
RBC: 3.88 x10E6/uL — ABNORMAL LOW (ref 4.14–5.80)
RDW: 14.2 % (ref 11.6–15.4)
WBC: 7.2 x10E3/uL (ref 3.4–10.8)

## 2024-05-21 LAB — HEMOGLOBIN A1C
Est. average glucose Bld gHb Est-mCnc: 220 mg/dL
Hgb A1c MFr Bld: 9.3 % — ABNORMAL HIGH (ref 4.8–5.6)

## 2024-05-21 LAB — URIC ACID: Uric Acid: 9.6 mg/dL — ABNORMAL HIGH (ref 3.8–8.4)

## 2024-05-21 NOTE — Progress Notes (Addendum)
 This patient returns to my office for at risk foot care.  This patient requires this care by a professional since this patient will be at risk due to having type 2 diabetes, CKD and coagulation defect.  This patient is unable to cut nails himself since the patient cannot reach his nails.These nails are painful walking and wearing shoes.  This patient presents for at risk foot care today.  General Appearance  Alert, conversant and in no acute stress.  Vascular  Dorsalis pedis and posterior tibial  pulses are palpable  bilaterally.  Capillary return is within normal limits  bilaterally. Temperature is within normal limits  bilaterally.  Neurologic  Senn-Weinstein monofilament wire test within normal limits  bilaterally. Muscle power within normal limits bilaterally.  Nails Thick disfigured discolored nails with subungual debris  from hallux to fifth toes bilaterally. No evidence of bacterial infection or drainage bilaterally.  Orthopedic  No limitations of motion  feet .  No crepitus or effusions noted.  No bony pathology or digital deformities noted.  Skin  normotropic skin with no porokeratosis noted bilaterally.  No signs of infections or ulcers noted.     Onychomycosis  Pain in right toes  Pain in left toes  Consent was obtained for treatment procedures.   Mechanical debridement of nails 1-5  bilaterally performed with a nail nipper.  Filed with dremel without incident.    Return office visit    3 months                  Told patient to return for periodic foot care and evaluation due to potential at risk complications.   Cordella Bold DPM  tomma

## 2024-05-27 ENCOUNTER — Telehealth: Payer: Self-pay

## 2024-05-27 ENCOUNTER — Ambulatory Visit: Payer: Self-pay

## 2024-05-27 ENCOUNTER — Other Ambulatory Visit: Payer: Self-pay | Admitting: Internal Medicine

## 2024-05-27 DIAGNOSIS — M545 Low back pain, unspecified: Secondary | ICD-10-CM

## 2024-05-27 MED ORDER — METHYLPREDNISOLONE 4 MG PO TBPK: ORAL_TABLET | ORAL | 0 refills | Status: DC

## 2024-05-27 NOTE — Telephone Encounter (Signed)
 Copied from CRM [id number]. Topic: Clinical - Medication Prior Auth >> May 27, 2024 11:35 AM Jasmin G wrote: Reason for CRM: Pt's daughter Ms. Jon called regarding pt being recently treated at [facility], he got X rays done and was prescribed steroids at clinic but due to his diabetes, prescription could not go through to help ease his pain, pt's daughter states that pt is in a lot of pain and steroids would help ease his pain a lot until he gets his joint injection. Please call Ms. Jon back at [phone] to discuss, also you can refer to NT encounter  from today at 9:37 a.m for more info.

## 2024-05-27 NOTE — Telephone Encounter (Signed)
 FYI Only or Action Required?: Action required by provider: would like medication to help with back pain.  Patient was last seen in primary care on 05/20/2024 by [name], MD.  Called Nurse Triage reporting Back Pain.  Symptoms began last Thursday.  Interventions attempted: Rest, hydration, or home remedies.  Symptoms are: gradually worsening.  Triage Disposition: See PCP When Office is Open (Within 3 Days)  Patient/caregiver understands and will follow disposition?: No, wishes to speak with PCP  Copied from CRM #[id number]. Topic: Clinical - Red Word Triage >> May 27, 2024  9:35 AM Tiffini S wrote: [facility] that prompted transfer to Nurse Triage: Patient called stating that is having back pain and cannot walk Reason for Disposition  [1] MODERATE back pain (e.g., interferes with normal activities) AND [2] present > 3 days  Answer Assessment - Initial Assessment Questions 1. ONSET: When did the pain begin? (e.g., minutes, hours, days)     X last Thursday  2. LOCATION: Where does it hurt? (upper, mid or lower back)     Back pain  near hip area 3. SEVERITY: How bad is the pain?  (e.g., Scale 1-10; mild, moderate, or severe)     10/10 - pt having trouble getting up and walking due to pain 4. PATTERN: Is the pain constant? (e.g., yes, no; constant, intermittent)      constant 5. RADIATION: Does the pain shoot into your legs or somewhere else?     no 6. CAUSE:  What do you think is causing the back pain?      unknown 7. BACK OVERUSE:  Any recent lifting of heavy objects, strenuous work or exercise?     na 8. MEDICINES: What have you taken so far for the pain? (e.g., nothing, acetaminophen , NSAIDS)     na 9. NEUROLOGIC SYMPTOMS: Do you have any weakness, numbness, or problems with bowel/bladder control?     Weakness all over due to pain in back 10. OTHER SYMPTOMS: Do you have any other symptoms? (e.g., fever, abdomen pain, burning with urination, blood in  urine)       no 11. PREGNANCY: Is there any chance you are pregnant? When was your last menstrual period?       Na  Pt needs something to help with pain. Pt stated that the last time he was put on steroids (Pills) and the steroid really helped with the pain and patient was able to walk.  Pt would like to see if he could get rx for steroids to help.  Please call patient.  Protocols used: Back Pain-A-AH

## 2024-05-27 NOTE — Telephone Encounter (Signed)
Pt informed

## 2024-06-01 ENCOUNTER — Telehealth: Payer: Self-pay | Admitting: *Deleted

## 2024-06-01 DIAGNOSIS — I2510 Atherosclerotic heart disease of native coronary artery without angina pectoris: Secondary | ICD-10-CM

## 2024-06-01 DIAGNOSIS — I5032 Chronic diastolic (congestive) heart failure: Secondary | ICD-10-CM

## 2024-06-01 DIAGNOSIS — I4819 Other persistent atrial fibrillation: Secondary | ICD-10-CM

## 2024-06-01 DIAGNOSIS — G4733 Obstructive sleep apnea (adult) (pediatric): Secondary | ICD-10-CM

## 2024-06-01 NOTE — Telephone Encounter (Signed)
 Per Dr Shlomo, -I will order new supplies and get him in with [employer] for his DME.  Order placed to [employer] [facility] via community message

## 2024-06-02 ENCOUNTER — Ambulatory Visit: Attending: Gastroenterology

## 2024-06-02 VITALS — BP 147/74 | HR 62 | Temp 97.6°F | Resp 18

## 2024-06-02 DIAGNOSIS — K5520 Angiodysplasia of colon without hemorrhage: Secondary | ICD-10-CM | POA: Diagnosis not present

## 2024-06-02 MED ORDER — OCTREOTIDE ACETATE 20 MG IM KIT
20.0000 mg | PACK | Freq: Once | INTRAMUSCULAR | Status: AC
Start: 2024-06-02 — End: 2024-06-02
  Administered 2024-06-02: 20 mg via INTRAMUSCULAR

## 2024-06-02 NOTE — Progress Notes (Signed)
 Diagnosis: AVM  Provider:  Eartha Sieving MD  Procedure: Injection  Sandostatin , Dose: 20 mg, Site: intramuscular, Number of injections: 1  Injection Site(s): Right upper quad. gluteus  Post Care: Observation period completed  Discharge: Condition: Good, Destination: Home . AVS Provided  Performed by:  Blanca Selinda SAUNDERS, LPN

## 2024-06-04 NOTE — Progress Notes (Shared)
 [facility] - Clinic Note  06/05/2024    CHIEF COMPLAINT Patient presents for No chief complaint on file.  HISTORY OF PRESENT ILLNESS: Lucas Dudley is a 83 y.o. male who presents to the clinic today for:   Pt states    Referring physician: [name], MD [address] [place],  [place] [address]  HISTORICAL INFORMATION:   Selected notes from the MEDICAL RECORD NUMBER Referred by Dr. Lelon for DEE   CURRENT MEDICATIONS: Current Outpatient Medications (Ophthalmic Drugs)  Medication Sig   COMBIGAN  0.2-0.5 % ophthalmic solution Place 1 drop into both eyes 2 (two) times daily.   No current facility-administered medications for this visit. (Ophthalmic Drugs)   Current Outpatient Medications (Other)  Medication Sig   methylPREDNISolone  (MEDROL  DOSEPAK) 4 MG TBPK tablet Take as package instructions.   albuterol  (VENTOLIN  HFA) 108 (90 Base) MCG/ACT inhaler Inhale 2 puffs into the lungs every 6 (six) hours as needed for wheezing or shortness of breath.   ammonium lactate  (AMLACTIN DAILY) 12 % lotion Apply 1 Application topically as needed.   aspirin  81 MG chewable tablet Chew 81 mg by mouth daily.   camphor-menthol (ANTI-ITCH) lotion Apply 1 Application topically daily as needed for itching.   colchicine  0.6 MG tablet Take 2 tablets on Day 1, followed by 1 tablet 1 hour later. Please take 1 tablet once daily from Day 2 till swelling improves.   dapagliflozin  propanediol (FARXIGA ) 10 MG TABS tablet Take 1 tablet (10 mg total) by mouth daily.   diphenhydramine -acetaminophen  (TYLENOL  PM) 25-500 MG TABS tablet Take 1 tablet by mouth at bedtime.   ELDERBERRY PO Take 2 tablets by mouth daily. Chewable   ferrous sulfate  325 (65 FE) MG tablet Take 1 tablet (325 mg total) by mouth daily with breakfast.   fluticasone  (FLONASE ) 50 MCG/ACT nasal spray Place 1 spray into both nostrils daily. SPRAY 2 SPRAYS INTO EACH NOSTRIL EVERY DAY   gemfibrozil  (LOPID ) 600 MG tablet  Take 600 mg by mouth at bedtime.    glimepiride  (AMARYL ) 2 MG tablet Take 1 tablet (2 mg total) by mouth daily with breakfast. (Patient taking differently: Take 2 mg by mouth at bedtime.)   Insulin  Glargine (BASAGLAR  KWIKPEN) 100 UNIT/ML Inject 45 Units into the skin at bedtime. (Patient taking differently: Inject 42 Units into the skin at bedtime.)   magnesium  gluconate (MAGONATE) 500 MG tablet Take 500 mg by mouth daily.   mineral oil liquid Take 15 mLs by mouth daily as needed for moderate constipation.   Omega-3 Fatty Acids (FISH OIL EXTRA STRENGTH) 1200 MG CAPS Take 1 capsule by mouth daily.   OVER THE COUNTER MEDICATION Take 1 tablet by mouth daily. Beets Chew   pantoprazole  (PROTONIX ) 40 MG tablet TAKE 1 TABLET BY MOUTH EVERY DAY   torsemide  (DEMADEX ) 20 MG tablet 40 MG IN THE MORNING AND 20 MG IN THE EVENING (Patient taking differently: Take 20 mg by mouth 2 (two) times daily. With an extra tablet later in the day if needed)   vitamin E 45 MG (100 UNITS) capsule Take 100 Units by mouth daily.   zinc  gluconate 50 MG tablet Take 50 mg by mouth daily.   No current facility-administered medications for this visit. (Other)   REVIEW OF SYSTEMS:   ALLERGIES Allergies  Allergen Reactions   Azithromycin  Nausea Only   Tape Rash and Other (See Comments)    Causes skin redness, Use paper tape only.   PAST MEDICAL HISTORY Past  Medical History:  Diagnosis Date   Arthritis    Atrial fibrillation (HCC)    CAD (coronary artery disease)    a. Cath 03/17/15 showing 100% ostial D1, 50% prox LAD to mid LAD, 40% RPDA stenosis. Med rx. // Myoview  01/2020: EF 31 diffuse perfusion defect without reversibility (suspect artifact); reviewed with Dr. Suellen study felt to be low risk   Cataract    Mixed form OD   Chronic diastolic CHF (congestive heart failure) (HCC)    Chronically elevated hemidiaphragm - Right Side    Diabetic retinopathy (HCC)    NPDR OU   Essential hypertension    Glaucoma     POAG OU   History of gout    Hyperlipidemia    Hypertensive retinopathy    OU   Kidney stones    Melanoma of neck (HCC)    NICM (nonischemic cardiomyopathy) (HCC)    OSA on CPAP 2012   Prostate cancer (HCC)    Type II diabetes mellitus (HCC)    Past Surgical History:  Procedure Laterality Date   ABDOMINAL HERNIA REPAIR     w/mesh   ATRIAL FIBRILLATION ABLATION N/A 06/06/2021   Procedure: ATRIAL FIBRILLATION ABLATION;  Surgeon: Kelsie Agent, MD;  Location: [facility] INVASIVE CV LAB;  Service: Cardiovascular;  Laterality: N/A;   BIOPSY  05/24/2022   Procedure: BIOPSY;  Surgeon: Shaaron Lamar HERO, MD;  Location: [facility];  Service: Endoscopy;;   BIOPSY  05/30/2022   Procedure: BIOPSY;  Surgeon: Shaaron Lamar HERO, MD;  [facility]: [facility];  Service: Endoscopy;;   BIOPSY  01/10/2023   Procedure: BIOPSY;  Surgeon: Wilhelmenia Aloha Raddle., MD;  Location: [facility];  Service: Gastroenterology;;   CARDIAC CATHETERIZATION N/A 03/17/2015   Procedure: Left Heart Cath and Coronary Angiography;  Surgeon: Dorn JINNY Lesches, MD;  Location: [facility] INVASIVE CV LAB;  Service: Cardiovascular;  Laterality: N/A;   CARDIOVERSION N/A 08/09/2021   Procedure: CARDIOVERSION;  Surgeon: Raford Riggs, MD;  Location: [facility] ENDOSCOPY;  Service: Cardiovascular;  Laterality: N/A;   CARDIOVERSION N/A 11/10/2021   Procedure: CARDIOVERSION;  Surgeon: Sheena Pugh, DO;  Location: [facility];  Service: Cardiovascular;  Laterality: N/A;   carotid doppler  09/17/2008   rigt and left ICAs 0-49%;mildly  abnormal   CATARACT EXTRACTION Left 2020   Dr. Lelon   COLONOSCOPY N/A 05/16/2017   Procedure: COLONOSCOPY;  Surgeon: Golda Claudis PENNER, MD;  Location: [facility];  Service: Endoscopy;  Laterality: N/A;  930   COLONOSCOPY WITH PROPOFOL  N/A 05/24/2022   Procedure: COLONOSCOPY WITH PROPOFOL ;  Surgeon: Shaaron Lamar HERO, MD;  Location: [facility];  Service: Endoscopy;  Laterality: N/A;   DOPPLER ECHOCARDIOGRAPHY  05/25/2009    EF 50-55%,LA mildly dilated, LV function normal   ELECTROPHYSIOLOGIC STUDY N/A 04/05/2015   Procedure: Cardioversion;  Surgeon: Jerel Balding, MD;  Location: [facility];  Service: Cardiovascular;  Laterality: N/A;   ELECTROPHYSIOLOGIC STUDY N/A 09/06/2015   Procedure: Atrial Fibrillation Ablation;  Surgeon: Agent Kelsie, MD;  Location[facility] INVASIVE CV LAB;  Service: Cardiovascular;  Laterality: N/A;   ELECTROPHYSIOLOGIC STUDY N/A 07/12/2016   redo afib ablation by Dr Kelsie   ENTEROSCOPY N/A 05/30/2022   Procedure: ENTEROSCOPY;  Surgeon: Shaaron Lamar HERO, MD;  [facility]: [facility];  Service: Endoscopy;  Laterality: N/A;   ENTEROSCOPY N/A 01/07/2024   Procedure: ENTEROSCOPY;  Surgeon: Eartha Angelia Sieving, MD;  [facility]: [facility];  Service: Gastroenterology;  Laterality: N/A;  1:45PM;ASA 3   ESOPHAGOGASTRODUODENOSCOPY (EGD) WITH  PROPOFOL  N/A 05/24/2022   Procedure: ESOPHAGOGASTRODUODENOSCOPY (EGD) WITH PROPOFOL ;  Surgeon: Shaaron Lamar HERO, MD;  [facility]: [facility];  Service: Endoscopy;  Laterality: N/A;   ESOPHAGOGASTRODUODENOSCOPY (EGD) WITH PROPOFOL  N/A 01/10/2023   Procedure: ESOPHAGOGASTRODUODENOSCOPY (EGD) WITH PROPOFOL ;  Surgeon: Wilhelmenia Aloha Raddle., MD;  Location: [facility];  Service: Gastroenterology;  Laterality: N/A;   EUS N/A 01/10/2023   Procedure: UPPER ENDOSCOPIC ULTRASOUND (EUS) RADIAL;  Surgeon: Wilhelmenia Aloha Raddle., MD;  Location: [facility];  Service: Gastroenterology;  Laterality: N/A;   EXCISIONAL HEMORRHOIDECTOMY     inside and out   EYE SURGERY Left 2020   Cat Sx - Dr. Lelon   FINE NEEDLE ASPIRATION Right    knee; drew ~ 1 quart off   GIVENS CAPSULE STUDY N/A 05/25/2022   Procedure: GIVENS CAPSULE STUDY;  Surgeon: Cindie Carlin POUR, DO;  Location: [facility];  Service: Endoscopy;  Laterality: N/A;   GIVENS CAPSULE STUDY N/A 01/23/2023   Procedure: GIVENS CAPSULE STUDY;  Surgeon: Eartha Angelia Sieving, MD;  Location: [facility];   Service: Gastroenterology;  Laterality: N/A;  8:30am   HERNIA REPAIR     HOT HEMOSTASIS N/A 01/10/2023   Procedure: HOT HEMOSTASIS (ARGON PLASMA COAGULATION/BICAP);  Surgeon: Wilhelmenia Aloha Raddle., MD;  Location: [facility];  Service: Gastroenterology;  Laterality: N/A;   HOT HEMOSTASIS  01/07/2024   Procedure: EGD, WITH ARGON PLASMA COAGULATION;  Surgeon: Eartha Angelia Sieving, MD;  [facility];  Service: Gastroenterology;;   LAPAROSCOPIC CHOLECYSTECTOMY     LEFT ATRIAL APPENDAGE OCCLUSION N/A 06/28/2022   Procedure: LEFT ATRIAL APPENDAGE OCCLUSION;  Surgeon: Wonda Sharper, MD;  Location: [facility] INVASIVE CV LAB;  Service: Cardiovascular;  Laterality: N/A;   MELANOMA EXCISION Right    neck   NM MYOCAR PERF WALL MOTION  02/21/2012   EF 61% ,EXERCISE 7 METS. exercise stopped due to wheezing and shortness of breathe   POLYPECTOMY  05/16/2017   Procedure: POLYPECTOMY;  Surgeon: Golda Claudis PENNER, MD;  [facility]: [facility];  Service: Endoscopy;;  colon   POLYPECTOMY  05/24/2022   Procedure: POLYPECTOMY;  Surgeon: Shaaron Lamar HERO, MD;  [facility];  Service: Endoscopy;;   POLYPECTOMY  01/10/2023   Procedure: POLYPECTOMY;  Surgeon: Wilhelmenia Aloha Raddle., MD;  Location: [facility];  Service: Gastroenterology;;   PROSTATECTOMY     RIGHT/LEFT HEART CATH AND CORONARY ANGIOGRAPHY N/A 01/27/2024   Procedure: RIGHT/LEFT HEART CATH AND CORONARY ANGIOGRAPHY;  Surgeon: Zenaida Morene PARAS, MD;  Location: [facility] INVASIVE CV LAB;  Service: Cardiovascular;  Laterality: N/A;   SHOULDER OPEN ROTATOR CUFF REPAIR Right X 2   SUBMUCOSAL TATTOO INJECTION  05/30/2022   Procedure: SUBMUCOSAL TATTOO INJECTION;  Surgeon: Shaaron Lamar HERO, MD;  Location[facility];  Service: Endoscopy;;   SUBMUCOSAL TATTOO INJECTION  01/10/2023   Procedure: SUBMUCOSAL TATTOO INJECTION;  Surgeon: Wilhelmenia Aloha Raddle., MD;  Location: [facility];  Service: Gastroenterology;;   TEE WITHOUT CARDIOVERSION N/A  09/05/2015   Procedure: TRANSESOPHAGEAL ECHOCARDIOGRAM (TEE);  Surgeon: Wilbert JONELLE Bihari, MD;  Location: [facility] ENDOSCOPY;  Service: Cardiovascular;  Laterality: N/A;   TEE WITHOUT CARDIOVERSION N/A 06/28/2022   Procedure: TRANSESOPHAGEAL ECHOCARDIOGRAM (TEE);  Surgeon: Wonda Sharper, MD;  Location: [facility] INVASIVE CV LAB;  Service: Cardiovascular;  Laterality: N/A;   FAMILY HISTORY Family History  Problem Relation Age of Onset   Heart disease Mother    Lung cancer Mother    Heart attack Mother 98   Diabetes Father    Heart disease Father  Stroke Brother    Healthy Daughter    Colon cancer Maternal Aunt        65s   SOCIAL HISTORY Social History   Tobacco Use   Smoking status: Former    Current packs/day: 0.00    Average packs/day: 2.0 packs/day for 27.0 years (54.0 ttl pk-yrs)    Types: Cigarettes    Start date: 72    Quit date: 1992    Years since quitting: 33.6    Passive exposure: Current   Smokeless tobacco: Never   Tobacco comments:    Former smoker 09/27/21  Vaping Use   Vaping status: Never Used  Substance Use Topics   Alcohol use: No    Alcohol/week: 0.0 standard drinks of alcohol    Comment: used to drink; stopped ~ 2008   Drug use: No       OPHTHALMIC EXAM: Not recorded    IMAGING AND PROCEDURES  Imaging and Procedures for 06/05/2024          ASSESSMENT/PLAN: No diagnosis found.  1-3. 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 Single focal MA superior to fovea; OS: Perifoveal Mas w/ late leakage - s/p IVA OS # 1(07.26.21), #2 (08.24.21), #3 (09.21.21), #4 (10.22.21) -- IVA resistance ============================= - s/p IVE OD #1 (09.29.23), #2 (10.30.23), #3 (SAMPLE) (11.27.23) #4 (12.29.23) #5 (01.26.24) #6 (02.23.24) -- IVE resistance - s/p IVE OS #1 (11.22.21), #2 (12.21.21), #3 (01.20.22), #4 (02.18.22), #5 (03.18.22), #6 (04.15.22), #7 (05.10.22) -- sample, #8 (06.08.22), #9 (07.12.22), #10  (08.12.22), #11 (09.16.22), #12 (10.21.22), #13 (11.18.22), #14 (12.21.22), #15 (01.20.23), #16 (02.17.23), #17 (03.24.23), #18 (04.21.23), #19 (05.26.23), #20 (06.23.23) -- IVE resistance ============================ - s/p IVV OD #1 (03.22.24), #2 (04.19.24), #3 (05.17.24), #4 (06.18.24), #5 (07.19.24), #6 (08.23.24), #7 (09.27.24), #8 (11.01.24), #9 (12.06.24), #10 (01.10.25 -- sample), #11 (02.14.25 -- sample) #12 (03.21.25 -- sample), #13 (04.25.25 -- sample) #14(05.30.25) #15(05.30.2025) - s/p IVV OS #1 (07.21.23), #2 (08.25.23), #3 (09.29.23), #4 (10.30.23), #5 (11.27.23) #6 (12.29.23), #7 (01.26.23), #8 (02.23.24), #9 (03.22.24), #10 (04.19.24), #11 (05.17.24), #12 (12.06.24), #13 (02.14.25 -- sample), #14 (03.21.25 -- sample),  #15 (04.25.25 -- sample), #16 (05.30.25 -- sample) #17(05.30.2025) ============================ - BCVA OD: 20/20 -- stable; OS improved to 20/20 from 20/25 - OCT shows OD: persistent perifoveal IRF/IRHM,  partial PVD; OS: persistent cystic changes and punctate IRHM, partial PVD at 6 weeks - recommend IVV OD #16 and IVV OS #18 [PAP medication] today, 08.21 25 with f/u in 6 weeks again [[employer] funding unavailable] - pt wishes to proceed with injections OU  - RBA of procedure discussed, questions answered  - Vabysmo  informed consent obtained and signed, 02.14.25 (OU) - [employer] approved Vabysmo , no good days for 2025 as of now - see procedure note - pt has PAP medication - f/u 6 weeks -- DFE/OCT, possible injection(s)  4,5. Hypertensive retinopathy OU - discussed importance of tight BP control - continue to monitor  6. Pseudophakia OU  - s/p CE/IOL OS (2020, Dr. Lelon)             - s/p CE/IOL OD (2022, Dr. 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 Caresse in August 2022  - s/p SuperK OS on 12.12.2022  8. History  of herpes zoster/iritis OS  -  on Valtrex  1 g daily---maintenance  9. POAG OU  - was under the expert management of Dr. Lelon, now follows with Dr. Fleeta  - IOP 21,22  - continue Cosopt  bid OU  Ophthalmic Meds Ordered this visit:  No orders of the defined types were placed in this encounter.    This document serves as a record of services personally performed by Redell JUDITHANN Hans, MD, PhD. It was created on their behalf by Delon Newness COT, an ophthalmic technician. The creation of this record is the provider's dictation and/or activities during the visit.    Electronically signed by: Delon Newness COT 0821.25  7:51 AM   Abbreviations: M myopia (nearsighted); A astigmatism; H hyperopia (farsighted); P presbyopia; Mrx spectacle prescription;  CTL contact lenses; OD right eye; OS left eye; OU both eyes  XT exotropia; ET esotropia; PEK punctate epithelial keratitis; PEE punctate epithelial erosions; DES dry eye syndrome; MGD meibomian gland dysfunction; ATs artificial tears; PFAT's preservative free artificial tears; NSC nuclear sclerotic cataract; PSC posterior subcapsular cataract; ERM epi-retinal membrane; PVD posterior vitreous detachment; RD retinal detachment; DM diabetes mellitus; DR diabetic retinopathy; NPDR non-proliferative diabetic retinopathy; PDR proliferative diabetic retinopathy; CSME clinically significant macular edema; DME diabetic macular edema; dbh dot blot hemorrhages; CWS cotton wool spot; POAG primary open angle glaucoma; C/D cup-to-disc ratio; HVF humphrey visual field; GVF goldmann visual field; OCT optical coherence tomography; IOP intraocular pressure; BRVO Branch retinal vein occlusion; CRVO central retinal vein occlusion; CRAO central retinal artery occlusion; BRAO branch retinal artery occlusion; RT retinal tear; SB scleral buckle; PPV pars plana vitrectomy; VH Vitreous hemorrhage; PRP panretinal laser photocoagulation; IVK intravitreal kenalog; VMT  vitreomacular traction; MH Macular hole;  NVD neovascularization of the disc; NVE neovascularization elsewhere; AREDS age related eye disease study; ARMD age related macular degeneration; POAG primary open angle glaucoma; EBMD epithelial/anterior basement membrane dystrophy; ACIOL anterior chamber intraocular lens; IOL intraocular lens; PCIOL posterior chamber intraocular lens; Phaco/IOL phacoemulsification with intraocular lens placement; PRK photorefractive keratectomy; LASIK laser assisted in situ keratomileusis; HTN hypertension; DM diabetes mellitus; COPD chronic obstructive pulmonary disease

## 2024-06-05 ENCOUNTER — Encounter (INDEPENDENT_AMBULATORY_CARE_PROVIDER_SITE_OTHER): Admitting: Ophthalmology

## 2024-06-05 DIAGNOSIS — H18523 Epithelial (juvenile) corneal dystrophy, bilateral: Secondary | ICD-10-CM

## 2024-06-05 DIAGNOSIS — Z7984 Long term (current) use of oral hypoglycemic drugs: Secondary | ICD-10-CM

## 2024-06-05 DIAGNOSIS — Z961 Presence of intraocular lens: Secondary | ICD-10-CM

## 2024-06-05 DIAGNOSIS — Z8619 Personal history of other infectious and parasitic diseases: Secondary | ICD-10-CM

## 2024-06-05 DIAGNOSIS — H401130 Primary open-angle glaucoma, bilateral, stage unspecified: Secondary | ICD-10-CM

## 2024-06-05 DIAGNOSIS — I10 Essential (primary) hypertension: Secondary | ICD-10-CM

## 2024-06-05 DIAGNOSIS — H35033 Hypertensive retinopathy, bilateral: Secondary | ICD-10-CM

## 2024-06-05 DIAGNOSIS — Z794 Long term (current) use of insulin: Secondary | ICD-10-CM

## 2024-06-05 DIAGNOSIS — E113213 Type 2 diabetes mellitus with mild nonproliferative diabetic retinopathy with macular edema, bilateral: Secondary | ICD-10-CM

## 2024-06-06 ENCOUNTER — Other Ambulatory Visit: Payer: Self-pay | Admitting: Internal Medicine

## 2024-06-06 DIAGNOSIS — E1169 Type 2 diabetes mellitus with other specified complication: Secondary | ICD-10-CM

## 2024-06-08 NOTE — Progress Notes (Signed)
 [facility] - Clinic Note  06/09/2024    CHIEF COMPLAINT Patient presents for Retina Follow Up  HISTORY OF PRESENT ILLNESS: Lucas Dudley is a 83 y.o. male who presents to the clinic today for:  HPI     Retina Follow Up   Patient presents with  Diabetic Retinopathy.  In both eyes.  This started 4 years ago.  Severity is moderate.  Duration of 6 weeks.  I, the attending physician,  performed the HPI with the patient and updated documentation appropriately.        Comments   Pt denies changes in vision/FOL/floaters/pain. Pt states it seems he has a film over his left eye while looking at the [place] chart. Pt is using cosopt  bid ou consistently.  A1c=9.3, 8.6.25 (higher due to temporary steroid medication taken for 7 days) Bs=64 this morning       Last edited by Valdemar Rogue, MD on 06/09/2024  4:49 PM.    Pt states he had to r/s last Friday for SI joint inflammation, had to have injection Wednesday before. Pt states VA is a little blurry, OS specifically.    Referring physician: [name], MD [address] [place],  [place] [address]  HISTORICAL INFORMATION:   Selected notes from the MEDICAL RECORD NUMBER Referred by Dr. Lelon for DEE   CURRENT MEDICATIONS: Current Outpatient Medications (Ophthalmic Drugs)  Medication Sig   COMBIGAN  0.2-0.5 % ophthalmic solution Place 1 drop into both eyes 2 (two) times daily.   No current facility-administered medications for this visit. (Ophthalmic Drugs)   Current Outpatient Medications (Other)  Medication Sig   aspirin  81 MG chewable tablet Chew 81 mg by mouth daily.   methylPREDNISolone  (MEDROL  DOSEPAK) 4 MG TBPK tablet Take as package instructions. (Patient not taking: Reported on 06/09/2024)   pantoprazole  (PROTONIX ) 40 MG tablet TAKE 1 TABLET BY MOUTH EVERY DAY   albuterol  (VENTOLIN  HFA) 108 (90 Base) MCG/ACT inhaler Inhale 2 puffs into the lungs every 6 (six) hours as needed for wheezing or shortness of  breath.   ammonium lactate  (AMLACTIN DAILY) 12 % lotion Apply 1 Application topically as needed.   camphor-menthol (ANTI-ITCH) lotion Apply 1 Application topically daily as needed for itching.   colchicine  0.6 MG tablet Take 2 tablets on Day 1, followed by 1 tablet 1 hour later. Please take 1 tablet once daily from Day 2 till swelling improves.   dapagliflozin  propanediol (FARXIGA ) 10 MG TABS tablet Take 1 tablet (10 mg total) by mouth daily.   diphenhydramine -acetaminophen  (TYLENOL  PM) 25-500 MG TABS tablet Take 1 tablet by mouth at bedtime.   ELDERBERRY PO Take 2 tablets by mouth daily. Chewable   ferrous sulfate  325 (65 FE) MG tablet Take 1 tablet (325 mg total) by mouth daily with breakfast.   fluticasone  (FLONASE ) 50 MCG/ACT nasal spray Place 1 spray into both nostrils daily. SPRAY 2 SPRAYS INTO EACH NOSTRIL EVERY DAY   gemfibrozil  (LOPID ) 600 MG tablet Take 600 mg by mouth at bedtime.    glimepiride  (AMARYL ) 2 MG tablet Take 1 tablet (2 mg total) by mouth daily with breakfast. (Patient taking differently: Take 2 mg by mouth at bedtime.)   Insulin  Glargine (BASAGLAR  KWIKPEN) 100 UNIT/ML INJECT 45 UNITS INTO THE SKIN AT BEDTIME.   magnesium  gluconate (MAGONATE) 500 MG tablet Take 500 mg by mouth daily.   mineral oil liquid Take 15 mLs by mouth daily as needed for moderate constipation.   Omega-3 Fatty Acids (FISH OIL  EXTRA STRENGTH) 1200 MG CAPS Take 1 capsule by mouth daily.   OVER THE COUNTER MEDICATION Take 1 tablet by mouth daily. Beets Chew   torsemide  (DEMADEX ) 20 MG tablet 40 MG IN THE MORNING AND 20 MG IN THE EVENING (Patient taking differently: Take 20 mg by mouth 2 (two) times daily. With an extra tablet later in the day if needed)   vitamin E 45 MG (100 UNITS) capsule Take 100 Units by mouth daily.   zinc  gluconate 50 MG tablet Take 50 mg by mouth daily.   No current facility-administered medications for this visit. (Other)   REVIEW OF SYSTEMS: ROS   Positive for:  Gastrointestinal, Endocrine, Cardiovascular, Eyes, Respiratory Negative for: Constitutional, Neurological, Skin, Genitourinary, Musculoskeletal, HENT, Psychiatric, Allergic/Imm, Heme/Lymph Last edited by Elnor Avelina RAMAN, COT on 06/09/2024  1:01 PM.      ALLERGIES Allergies  Allergen Reactions   Azithromycin  Nausea Only   Tape Rash and Other (See Comments)    Causes skin redness, Use paper tape only.   PAST MEDICAL HISTORY Past Medical History:  Diagnosis Date   Arthritis    Atrial fibrillation (HCC)    CAD (coronary artery disease)    a. Cath 03/17/15 showing 100% ostial D1, 50% prox LAD to mid LAD, 40% RPDA stenosis. Med rx. // Myoview  01/2020: EF 31 diffuse perfusion defect without reversibility (suspect artifact); reviewed with Dr. Suellen study felt to be low risk   Cataract    Mixed form OD   Chronic diastolic CHF (congestive heart failure) (HCC)    Chronically elevated hemidiaphragm - Right Side    Diabetic retinopathy (HCC)    NPDR OU   Essential hypertension    Glaucoma    POAG OU   History of gout    Hyperlipidemia    Hypertensive retinopathy    OU   Kidney stones    Melanoma of neck (HCC)    NICM (nonischemic cardiomyopathy) (HCC)    OSA on CPAP 2012   Prostate cancer (HCC)    Type II diabetes mellitus (HCC)    Past Surgical History:  Procedure Laterality Date   ABDOMINAL HERNIA REPAIR     w/mesh   ATRIAL FIBRILLATION ABLATION N/A 06/06/2021   Procedure: ATRIAL FIBRILLATION ABLATION;  Surgeon: Kelsie Agent, MD;  Location: [facility] IN[facility]ASIVE CV LAB;  Service: Cardiovascular;  Laterality: N/A;   BIOPSY  05/24/2022   Procedure: BIOPSY;  Surgeon: Shaaron Lamar HERO, MD;  [facility]: [facility];  Service: Endoscopy;;   BIOPSY  05/30/2022   Procedure: BIOPSY;  Surgeon: Shaaron Lamar HERO, MD;  [facility];  Service: Endoscopy;;   BIOPSY  01/10/2023   Procedure: BIOPSY;  Surgeon: Wilhelmenia Aloha Raddle., MD;  Location: [facility];  Service: Gastroenterology;;    CARDIAC CATHETERIZATION N/A 03/17/2015   Procedure: Left Heart Cath and Coronary Angiography;  Surgeon: Dorn JINNY Lesches, MD;  Location: [facility] INVASIVE CV LAB;  Service: Cardiovascular;  Laterality: N/A;   CARDIOVERSION N/A 08/09/2021   Procedure: CARDIOVERSION;  Surgeon: Raford Riggs, MD;  Location: [facility] ENDOSCOPY;  Service: Cardiovascular;  Laterality: N/A;   CARDIOVERSION N/A 11/10/2021   Procedure: CARDIOVERSION;  Surgeon: Sheena Pugh, DO;  Location: [facility];  Service: Cardiovascular;  Laterality: N/A;   carotid doppler  09/17/2008   rigt and left ICAs 0-49%;mildly  abnormal   CATARACT EXTRACTION Left 2020   Dr. Lelon   COLONOSCOPY N/A 05/16/2017   Procedure: COLONOSCOPY;  Surgeon: Golda Claudis PENNER, MD;  [facility];  Service: Endoscopy;  Laterality: N/A;  930   COLONOSCOPY WITH PROPOFOL  N/A 05/24/2022   Procedure: COLONOSCOPY WITH PROPOFOL ;  Surgeon: Shaaron Lamar HERO, MD;  [facility];  Service: Endoscopy;  Laterality: N/A;   DOPPLER ECHOCARDIOGRAPHY  05/25/2009   EF 50-55%,LA mildly dilated, LV function normal   ELECTROPHYSIOLOGIC STUDY N/A 04/05/2015   Procedure: Cardioversion;  Surgeon: Jerel Balding, MD;  Location: [facility];  Service: Cardiovascular;  Laterality: N/A;   ELECTROPHYSIOLOGIC STUDY N/A 09/06/2015   Procedure: Atrial Fibrillation Ablation;  Surgeon: [name], MD;  Location: [facility] INVASIVE CV LAB;  Service: Cardiovascular;  Laterality: N/A;   ELECTROPHYSIOLOGIC STUDY N/A 07/12/2016   redo afib ablation by Dr Rakers   ENTEROSCOPY N/A 05/30/2022   Procedure: ENTEROSCOPY;  Surgeon: Shaaron Lamar HERO, MD;  [facility];  Service: Endoscopy;  Laterality: N/A;   ENTEROSCOPY N/A 01/07/2024   Procedure: ENTEROSCOPY;  Surgeon: Eartha Angelia Sieving, MD;  [facility];  Service: Gastroenterology;  Laterality: N/A;  1:45PM;ASA 3   ESOPHAGOGASTRODUODENOSCOPY (EGD) WITH PROPOFOL  N/A 05/24/2022   Procedure:  ESOPHAGOGASTRODUODENOSCOPY (EGD) WITH PROPOFOL ;  Surgeon: Shaaron Lamar HERO, MD;  [facility];  Service: Endoscopy;  Laterality: N/A;   ESOPHAGOGASTRODUODENOSCOPY (EGD) WITH PROPOFOL  N/A 01/10/2023   Procedure: ESOPHAGOGASTRODUODENOSCOPY (EGD) WITH PROPOFOL ;  Surgeon: Wilhelmenia Aloha Raddle., MD;  Location: [facility];  Service: Gastroenterology;  Laterality: N/A;   EUS N/A 01/10/2023   Procedure: UPPER ENDOSCOPIC ULTRASOUND (EUS) RADIAL;  Surgeon: Wilhelmenia Aloha Raddle., MD;  Location: [facility];  Service: Gastroenterology;  Laterality: N/A;   EXCISIONAL HEMORRHOIDECTOMY     inside and out   EYE SURGERY Left 2020   Cat Sx - Dr. Lelon   FINE NEEDLE ASPIRATION Right    knee; drew ~ 1 quart off   GIVENS CAPSULE STUDY N/A 05/25/2022   Procedure: GIVENS CAPSULE STUDY;  Surgeon: Cindie Carlin POUR, DO;  [facility];  Service: Endoscopy;  Laterality: N/A;   GIVENS CAPSULE STUDY N/A 01/23/2023   Procedure: GIVENS CAPSULE STUDY;  Surgeon: Eartha Angelia Sieving, MD;  [facility];  Service: Gastroenterology;  Laterality: N/A;  8:30am   HERNIA REPAIR     HOT HEMOSTASIS N/A 01/10/2023   Procedure: HOT HEMOSTASIS (ARGON PLASMA COAGULATION/BICAP);  Surgeon: Wilhelmenia Aloha Raddle., MD;  Location: [facility];  Service: Gastroenterology;  Laterality: N/A;   HOT HEMOSTASIS  01/07/2024   Procedure: EGD, WITH ARGON PLASMA COAGULATION;  Surgeon: Eartha Angelia Sieving, MD;  [facility];  Service: Gastroenterology;;   LAPAROSCOPIC CHOLECYSTECTOMY     LEFT ATRIAL APPENDAGE OCCLUSION N/A 06/28/2022   Procedure: LEFT ATRIAL APPENDAGE OCCLUSION;  Surgeon: Wonda Sharper, MD;  Location: [facility] INVASIVE CV LAB;  Service: Cardiovascular;  Laterality: N/A;   MELANOMA EXCISION Right    neck   NM MYOCAR PERF WALL MOTION  02/21/2012   EF 61% ,EXERCISE 7 METS. exercise stopped due to wheezing and shortness of breathe   POLYPECTOMY  05/16/2017   Procedure:  POLYPECTOMY;  Surgeon: Golda Claudis PENNER, MD;  [facility];  Service: Endoscopy;;  colon   POLYPECTOMY  05/24/2022   Procedure: POLYPECTOMY;  Surgeon: Shaaron Lamar HERO, MD;  [facility];  Service: Endoscopy;;   POLYPECTOMY  01/10/2023   Procedure: POLYPECTOMY;  Surgeon: Wilhelmenia Aloha Raddle., MD;  Location: [facility];  Service: Gastroenterology;;   PROSTATECTOMY     RIGHT/LEFT HEART CATH AND CORONARY ANGIOGRAPHY N/A 01/27/2024   Procedure: RIGHT/LEFT HEART CATH AND CORONARY ANGIOGRAPHY;  Surgeon: Zenaida Morene PARAS, MD;  Location: [facility] INVASIVE CV LAB;  Service: Cardiovascular;  Laterality: N/A;   SHOULDER OPEN ROTATOR CUFF REPAIR Right X 2   SUBMUCOSAL TATTOO INJECTION  05/30/2022   Procedure: SUBMUCOSAL TATTOO INJECTION;  Surgeon: Shaaron Lamar HERO, MD;  [facility];  Service: Endoscopy;;   SUBMUCOSAL TATTOO INJECTION  01/10/2023   Procedure: SUBMUCOSAL TATTOO INJECTION;  Surgeon: Wilhelmenia Aloha Raddle., MD;  Location: [facility];  Service: Gastroenterology;;   TEE WITHOUT CARDIOVERSION N/A 09/05/2015   Procedure: TRANSESOPHAGEAL ECHOCARDIOGRAM (TEE);  Surgeon: Wilbert JONELLE Bihari, MD;  Location: [facility] ENDOSCOPY;  Service: Cardiovascular;  Laterality: N/A;   TEE WITHOUT CARDIOVERSION N/A 06/28/2022   Procedure: TRANSESOPHAGEAL ECHOCARDIOGRAM (TEE);  Surgeon: Wonda Sharper, MD;  Location: [facility] INVASIVE CV LAB;  Service: Cardiovascular;  Laterality: N/A;   FAMILY HISTORY Family History  Problem Relation Age of Onset   Heart disease Mother    Lung cancer Mother    Heart attack Mother 41   Diabetes Father    Heart disease Father    Stroke Brother    Healthy Daughter    Colon cancer Maternal Aunt        43s   SOCIAL HISTORY Social History   Tobacco Use   Smoking status: Former    Current packs/day: 0.00    Average packs/day: 2.0 packs/day for 27.0 years (54.0 ttl pk-yrs)    Types: Cigarettes    Start date: 33    Quit date: 1992    Years since quitting:  33.6    Passive exposure: Current   Smokeless tobacco: Never   Tobacco comments:    Former smoker 09/27/21  Vaping Use   Vaping status: Never Used  Substance Use Topics   Alcohol use: No    Alcohol/week: 0.0 standard drinks of alcohol    Comment: used to drink; stopped ~ 2008   Drug use: No       OPHTHALMIC EXAM: Base Eye Exam     Visual Acuity (Snellen - Linear)       Right Left   Dist cc 20/20 -2 20/30 -2   Dist ph cc  20/30 +1         Tonometry (Tonopen, 1:08 PM)       Right Left   Pressure 17 15         Pupils       Pupils Dark Light Shape React APD   Right PERRL 2 1 Round Brisk None   Left PERRL 2 1 Round Brisk None         Visual Fields       Left Right    Full Full         Extraocular Movement       Right Left    Full, Ortho Full, Ortho         Neuro/Psych     Oriented x3: Yes   Mood/Affect: Normal         Dilation     Both eyes: 1.0% Mydriacyl, 2.5% Phenylephrine  @ 1:08 PM           Slit Lamp and Fundus Exam     Slit Lamp Exam       Right Left   Lids/Lashes Dermato, mild MGD Dermato, mild MGD   Conjunctiva/Sclera Temporal pinguecula, mild inferior sub conj heme Temporal pinguecula   Cornea EBMD, mild haze, trace PEE, mild Debris in tear film, well healed cataract wound trace haze, trace PEE, well healed temporal cataract  wounds, arcus   Anterior Chamber Deep and clear; narrow temporal angle Deep and clear   Iris Round and dilated, mild anterior bowing, No NVI Round and moderately dilated to 5.61mm   Lens PCIOL in good position, trace PCO PC IOL in good position with open PC   Anterior Vitreous Synerisis Synerisis         Fundus Exam       Right Left   Disc Mild pallor, sharp rim, +cupping, thin inferior rim Mild pallor, sharp rim, +cupping, +PPA   C/D Ratio 0.7 0.7   Macula Flat, good foveal reflex, trace cystic changes - persistent, RPE mottling and clumping, no heme, Drusen, no exudates Flat, blunted foveal  reflex, trace cystic changes-improved, punctate MA -- improved   Vessels attenuated, Tortuous attenuated, Tortuous   Periphery Attached, rare MA, focal DBH nasal to disc--improved Attached. No heme.           IMAGING AND PROCEDURES  Imaging and Procedures for 06/09/2024  OCT, Retina - OU - Both Eyes       Right Eye Quality was good. Central Foveal Thickness: 270. Progression has been stable. Findings include no SRF, abnormal foveal contour, intraretinal fluid, vitreomacular adhesion (Trace cystic changes ST fovea, partial PVD).   Left Eye Quality was good. Central Foveal Thickness: 270. Progression has improved. Findings include normal foveal contour, no IRF, no SRF, intraretinal hyper-reflective material (persistent cystic changes and punctate IRHM--improved, partial PVD).   Notes *Images captured and stored on drive  Diagnosis / Impression:  +DME OU OD: Trace cystic changes ST fovea, partial PVD OS: persistent cystic changes and punctate IRHM--improved, partial PVD  Clinical management:  See below  Abbreviations: NFP - Normal foveal profile. CME - cystoid macular edema. PED - pigment epithelial detachment. IRF - intraretinal fluid. SRF - subretinal fluid. EZ - ellipsoid zone. ERM - epiretinal membrane. ORA - outer retinal atrophy. ORT - outer retinal tubulation. SRHM - subretinal hyper-reflective material. IRHM - intraretinal hyper-reflective material     Intravitreal Injection, Pharmacologic Agent - OD - Right Eye       Time Out 06/09/2024. 1:48 PM. Confirmed correct patient, procedure, site, and patient consented.   Anesthesia Topical anesthesia was used. Anesthetic medications included Lidocaine  2%, Proparacaine 0.5%.   Procedure Preparation included 5% betadine to ocular surface, eyelid speculum. A (32g) needle was used.   Injection: 6 mg faricimab -svoa 6 MG/0.05ML (Patient supplied)   Route: Intravitreal, Site: Right Eye   NDC: 49757-903-98, Lot: [id number],  Expiration date: 07/14/2025, Waste: 0 mL   Post-op Post injection exam found visual acuity of at least counting fingers. The patient tolerated the procedure well. There were no complications. The patient received written and verbal post procedure care education.   Notes **SAMPLE MEDICATION ADMINISTERED**      Intravitreal Injection, Pharmacologic Agent - OS - Left Eye       Time Out 06/09/2024. 1:49 PM. Confirmed correct patient, procedure, site, and patient consented.   Anesthesia Topical anesthesia was used. Anesthetic medications included Lidocaine  2%, Proparacaine 0.5%.   Procedure Preparation included 5% betadine to ocular surface, eyelid speculum. A (32g) needle was used.   Injection: 6 mg faricimab -svoa 6 MG/0.05ML (Patient supplied)   Route: Intravitreal, Site: Left Eye   NDC: 49757-903-98, Lot: [id number], Expiration date: 07/14/2025, Waste: 0 mL   Post-op Post injection exam found visual acuity of at least counting fingers. The patient tolerated the procedure well. There were no complications. The patient received written and verbal post procedure care education.  Post injection medications were not given.   Notes **SAMPLE MEDICATION ADMINISTERED**            ASSESSMENT/PLAN:   ICD-10-CM   1. 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, associated with type 2 diabetes mellitus (HCC)  E11.3213 OCT, Retina - OU - Both Eyes    Intravitreal Injection, Pharmacologic Agent - OD - Right Eye    Intravitreal Injection, Pharmacologic Agent - OS - Left Eye    faricimab -svoa (VABYSMO ) 6mg /0.25mL intravitreal injection    faricimab -svoa (VABYSMO ) 6mg /0.64mL intravitreal injection    2. Current use of insulin  (HCC)  Z79.4     3. Long term (current) use of oral hypoglycemic drugs  Z79.84     4. Essential hypertension  I10     5. Hypertensive retinopathy of both eyes  H35.033     6. Pseudophakia of both eyes  Z96.1     7. 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 History of herpes zoster of eye  Z86.19     9. Primary open angle glaucoma of both eyes, unspecified glaucoma stage  H40.1130       1-3. 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 Single focal MA superior to fovea; OS: Perifoveal Mas w/ late leakage - s/p IVA OS # 1(07.26.21), #2 (08.24.21), #3 (09.21.21), #4 (10.22.21) -- IVA resistance ============================= - s/p IVE OD #1 (09.29.23), #2 (10.30.23), #3 (SAMPLE) (11.27.23) #4 (12.29.23) #5 (01.26.24) #6 (02.23.24) -- IVE resistance - s/p IVE OS #1 (11.22.21), #2 (12.21.21), #3 (01.20.22), #4 (02.18.22), #5 (03.18.22), #6 (04.15.22), #7 (05.10.22) -- sample, #8 (06.08.22), #9 (07.12.22), #10 (08.12.22), #11 (09.16.22), #12 (10.21.22), #13 (11.18.22), #14 (12.21.22), #15 (01.20.23), #16 (02.17.23), #17 (03.24.23), #18 (04.21.23), #19 (05.26.23), #20 (06.23.23) -- IVE resistance ============================ - s/p IVV OD #1 (03.22.24), #2 (04.19.24), #3 (05.17.24), #4 (06.18.24), #5 (07.19.24), #6 (08.23.24), #7 (09.27.24), #8 (11.01.24), #9 (12.06.24), #10 (01.10.25 -- sample), #11 (02.14.25 -- sample) #12 (03.21.25 -- sample), #13 (04.25.25 -- sample) #14(05.30.25), #15 (07.11.25--PAP) - s/p IVV OS #1 (07.21.23), #2 (08.25.23), #3 (09.29.23), #4 (10.30.23), #5 (11.27.23) #6 (12.29.23), #7 (01.26.23), #8 (02.23.24), #9 (03.22.24), #10 (04.19.24), #11 (05.17.24), #12 (12.06.24), #13 (02.14.25 -- sample), #14 (03.21.25 -- sample),  #15 (04.25.25 -- sample), #16 (05.30.25 -- sample), #17 (07.11.25--PAP) ============================ - BCVA OD: 20/20 -- stable; OS improved to 20/20 from 20/25 - OCT shows OD: Trace cystic changes ST fovea,  partial PVD; OS: persistent cystic changes and punctate IRHM--improved, partial PVD at 6.9 weeks - recommend IVV OD #16 and IVV OS #18 [samples] today, 08.26.25 with f/u in 6 weeks again [[employer] funding  unavailable and PAP medication unavailable today] - pt wishes to proceed with injections OU  - RBA of procedure discussed, questions answered  - Vabysmo  informed consent obtained and signed, 02.14.25 (OU) - [employer] approved Vabysmo , no good days for 2025 as of now - see procedure note - pt has PAP medication -- do not have PAP meds today, will use samples.  - f/u 6 weeks -- DFE/OCT, possible injection(s)  4,5. Hypertensive retinopathy OU - discussed importance of tight BP control - continue to monitor  6. Pseudophakia OU  - s/p CE/IOL OS (2020, Dr. Lelon)             - s/p CE/IOL OD (2022, Dr. Lelon)  - s/p YAG cap OS (04.04.23) - BCVA 20/30 OD and 20/25 OS from 20/150  -  IOLs in good position, doing well  - continue to monitor  7. 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 Caresse in August 2022  - s/p SuperK OS on 12.12.2022  8. History of herpes zoster/iritis OS  - on valtrex  1g daily--maintenance  9. POAG OU  - was under the expert management of Dr. Lelon, now follows with Dr. Fleeta  - IOP   - continue cosopt  bid OU  Ophthalmic Meds Ordered this visit:  Meds ordered this encounter  Medications   faricimab -svoa (VABYSMO ) 6mg /0.20mL intravitreal injection   faricimab -svoa (VABYSMO ) 6mg /0.41mL intravitreal injection     This document serves as a record of services personally performed by Redell JUDITHANN Hans, MD, PhD. It was created on their behalf by Auston Muzzy, COMT. The creation of this record is the provider's dictation and/or activities during the visit.  Electronically signed by: Auston Muzzy, COMT 06/09/24 4:51 PM  This document serves as a record of services personally performed by Redell JUDITHANN Hans, MD, PhD. It was created on their behalf by Almetta Pesa, an ophthalmic technician. The creation of this record is the provider's dictation and/or activities during the visit.    Electronically signed by: Almetta Pesa, OA, 06/09/24  4:51 PM   Redell JUDITHANN Hans,  M.D., Ph.D. Diseases & Surgery of the Retina and Vitreous [facility]  I have reviewed the above documentation for accuracy and completeness, and I agree with the above. Redell JUDITHANN Hans, M.D., Ph.D. 06/09/24 4:52 PM   Abbreviations: M myopia (nearsighted); A astigmatism; H hyperopia (farsighted); P presbyopia; Mrx spectacle prescription;  CTL contact lenses; OD right eye; OS left eye; OU both eyes  XT exotropia; ET esotropia; PEK punctate epithelial keratitis; PEE punctate epithelial erosions; DES dry eye syndrome; MGD meibomian gland dysfunction; ATs artificial tears; PFAT's preservative free artificial tears; NSC nuclear sclerotic cataract; PSC posterior subcapsular cataract; ERM epi-retinal membrane; PVD posterior vitreous detachment; RD retinal detachment; DM diabetes mellitus; DR diabetic retinopathy; NPDR non-proliferative diabetic retinopathy; PDR proliferative diabetic retinopathy; CSME clinically significant macular edema; DME diabetic macular edema; dbh dot blot hemorrhages; CWS cotton wool spot; POAG primary open angle glaucoma; C/D cup-to-disc ratio; HVF humphrey visual field; GVF goldmann visual field; OCT optical coherence tomography; IOP intraocular pressure; BRVO Branch retinal vein occlusion; CRVO central retinal vein occlusion; CRAO central retinal artery occlusion; BRAO branch retinal artery occlusion; RT retinal tear; SB scleral buckle; PPV pars plana vitrectomy; VH Vitreous hemorrhage; PRP panretinal laser photocoagulation; IVK intravitreal kenalog; VMT vitreomacular traction; MH Macular hole;  NVD neovascularization of the disc; NVE neovascularization elsewhere; AREDS age related eye disease study; ARMD age related macular degeneration; POAG primary open angle glaucoma; EBMD epithelial/anterior basement membrane dystrophy; ACIOL anterior chamber intraocular lens; IOL intraocular lens; PCIOL posterior chamber intraocular lens; Phaco/IOL phacoemulsification with  intraocular lens placement; PRK photorefractive keratectomy; LASIK laser assisted in situ keratomileusis; HTN hypertension; DM diabetes mellitus; COPD chronic obstructive pulmonary disease

## 2024-06-09 ENCOUNTER — Encounter (INDEPENDENT_AMBULATORY_CARE_PROVIDER_SITE_OTHER): Payer: Self-pay | Admitting: Ophthalmology

## 2024-06-09 ENCOUNTER — Ambulatory Visit (INDEPENDENT_AMBULATORY_CARE_PROVIDER_SITE_OTHER): Admitting: Ophthalmology

## 2024-06-09 DIAGNOSIS — Z794 Long term (current) use of insulin: Secondary | ICD-10-CM | POA: Diagnosis not present

## 2024-06-09 DIAGNOSIS — I10 Essential (primary) hypertension: Secondary | ICD-10-CM

## 2024-06-09 DIAGNOSIS — Z8619 Personal history of other infectious and parasitic diseases: Secondary | ICD-10-CM

## 2024-06-09 DIAGNOSIS — Z7984 Long term (current) use of oral hypoglycemic drugs: Secondary | ICD-10-CM

## 2024-06-09 DIAGNOSIS — H18523 Epithelial (juvenile) corneal dystrophy, bilateral: Secondary | ICD-10-CM

## 2024-06-09 DIAGNOSIS — Z961 Presence of intraocular lens: Secondary | ICD-10-CM

## 2024-06-09 DIAGNOSIS — H35033 Hypertensive retinopathy, bilateral: Secondary | ICD-10-CM

## 2024-06-09 DIAGNOSIS — E113213 Type 2 diabetes mellitus with mild nonproliferative diabetic retinopathy with macular edema, bilateral: Secondary | ICD-10-CM | POA: Diagnosis not present

## 2024-06-09 DIAGNOSIS — H401130 Primary open-angle glaucoma, bilateral, stage unspecified: Secondary | ICD-10-CM

## 2024-06-09 MED ORDER — FARICIMAB-SVOA 6 MG/0.05ML IZ SOLN
6.0000 mg | INTRAVITREAL | Status: AC | PRN
  Administered 2024-06-09: 6 mg via INTRAVITREAL

## 2024-06-10 ENCOUNTER — Inpatient Hospital Stay: Attending: Hematology

## 2024-06-10 DIAGNOSIS — E538 Deficiency of other specified B group vitamins: Secondary | ICD-10-CM

## 2024-06-10 DIAGNOSIS — K921 Melena: Secondary | ICD-10-CM | POA: Diagnosis not present

## 2024-06-10 DIAGNOSIS — Z79899 Other long term (current) drug therapy: Secondary | ICD-10-CM | POA: Insufficient documentation

## 2024-06-10 DIAGNOSIS — D500 Iron deficiency anemia secondary to blood loss (chronic): Secondary | ICD-10-CM | POA: Insufficient documentation

## 2024-06-10 LAB — CBC WITH DIFFERENTIAL/PLATELET
Abs Immature Granulocytes: 0.02 K/uL (ref 0.00–0.07)
Basophils Absolute: 0 K/uL (ref 0.0–0.1)
Basophils Relative: 1 %
Eosinophils Absolute: 0.3 K/uL (ref 0.0–0.5)
Eosinophils Relative: 4 %
HCT: 36.6 % — ABNORMAL LOW (ref 39.0–52.0)
Hemoglobin: 11.3 g/dL — ABNORMAL LOW (ref 13.0–17.0)
Immature Granulocytes: 0 %
Lymphocytes Relative: 17 %
Lymphs Abs: 1.2 K/uL (ref 0.7–4.0)
MCH: 27.6 pg (ref 26.0–34.0)
MCHC: 30.9 g/dL (ref 30.0–36.0)
MCV: 89.5 fL (ref 80.0–100.0)
Monocytes Absolute: 0.5 K/uL (ref 0.1–1.0)
Monocytes Relative: 8 %
Neutro Abs: 5 K/uL (ref 1.7–7.7)
Neutrophils Relative %: 70 %
Platelets: 299 K/uL (ref 150–400)
RBC: 4.09 MIL/uL — ABNORMAL LOW (ref 4.22–5.81)
RDW: 14.2 % (ref 11.5–15.5)
WBC: 7 K/uL (ref 4.0–10.5)
nRBC: 0 % (ref 0.0–0.2)

## 2024-06-10 LAB — IRON AND TIBC
Iron: 128 ug/dL (ref 45–182)
Saturation Ratios: 28 % (ref 17.9–39.5)
TIBC: 456 ug/dL — ABNORMAL HIGH (ref 250–450)
UIBC: 328 ug/dL

## 2024-06-10 LAB — SAMPLE TO BLOOD BANK

## 2024-06-10 LAB — FERRITIN: Ferritin: 20 ng/mL — ABNORMAL LOW (ref 24–336)

## 2024-06-10 LAB — VITAMIN B12: Vitamin B-12: 489 pg/mL (ref 180–914)

## 2024-06-15 LAB — METHYLMALONIC ACID, SERUM: Methylmalonic Acid, Quantitative: 228 nmol/L (ref 0–378)

## 2024-06-16 NOTE — Progress Notes (Unsigned)
 [facility] [address], [place] [address]   CLINIC:  Medical Oncology/Hematology  PCP:  [name], MD [address] [place] [address] [phone]   REASON FOR VISIT:  Follow-up for normocytic anemia  PRIOR THERAPY: History of PRBC transfusions (2023)  CURRENT THERAPY: Oral iron , B12 supplement, intermittent IV iron   INTERVAL HISTORY:   Lucas Dudley 83 y.o. male returns for routine follow-up of normocytic anemia.   He was last seen by Pleasant Barefoot PA-C on 03/10/2024. He received IV Feraheme x 1 on 03/16/2024. He continues to receive octreotide  injections via GI office since 02/02/2024.  Patient's chief concern today is his right hip pain, for which he is following with primary care and orthopedics. He does report that he feels overall better ever since starting octreotide  and receiving most recent IV iron . He has some recurrent fatigue, dyspnea on exertion, and ice pica. He has occasional scant rectal bleeding from hemorrhoids about once a month.  He has not noticed any melena for over 6 months.  Denies any recent epistaxis. He denies any restless legs, headaches, lightheadedness, or syncope. He is taking daily combination iron /B12 supplement. He takes daily aspirin  81 mg.  He has 35% energy and 100% appetite.  He endorses that he is maintaining a stable weight.  ASSESSMENT & PLAN:  1.  Normocytic anemia (iron  deficiency & B12 deficiency): - Patient seen at the request of Mitzie Boettcher, NP - Hospitalized in August 2023 with Hgb 7.6, received 2 units PRBC.  He underwent EGD and colonoscopy which did not show any evidence of bleeding.  Subsequent enteroscopy again did not show a clear source of bleeding. 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  Small bowel nodule - junction distal duodenum/jejunum.  Irregular with a submucosal component, suspicious in appearance.  Pathology was  benign. - Colonoscopy (05/24/2022): 5 mm polyp in the sigmoid colon, diverticulosis in the descending colon and at the splenic flexure.  Pathology - hyperplastic polyp. - EGD (05/24/2022): Normal esophagus, small hiatal hernia, friable gastric mucosa, couple of tiny gastric erosions, normal duodenal bulb and second part of duodenum. - Small bowel endoscopy by Dr. 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 - He was on Eliquis  until September 2023, now s/p Watchman procedure.  Takes aspirin  daily. - He was started monthly octerotide via Gastroenterology as of April 2025 - Reports intermittent scant rectal bleeding from hemorrhoids.  Previously had issues with melanotic stool, but none since starting octreotide  injections - Hematology workup (09/03/2022): Hemoccult stool positive x 2 Normal reticulocytes, haptoglobin, DAT/Coombs. Persistent but improved iron  deficiency with ferritin 33, iron  saturation 17% Vitamin B12 deficiency with low B12 162, normal MMA.  Normal copper . SPEP negative.  Minimal elevations in free light chains with kappa 41.1, normal lambda 21.3, and mildly elevated ratio 1.93.  Immunofixation normal. He has CKD stage IIIa/b, does not follow with nephrology - Most recent IV iron  with Feraheme x 1 in June 2025 - He is taking iron  and B12 supplement daily  - Most recent labs (06/10/2024):  Hgb 11.3/MCV 89.5 Ferritin 20, iron  saturation 28% with elevated TIBC 456 Vitamin B12 489, normal MMA - DIFFERENTIAL DIAGNOSIS: Suspect iron  deficiency anemia in the setting of occult GI bleeding as well as vitamin B12 deficiency.  Also has aspect of anemia related to his CKD. - PLAN: Recommend IV Feraheme x 2.  Proceed with first dose  today. - Continue daily iron  and B12 supplement - Labs in 3 months = CBC/D, BB sample, ferritin, iron /TIBC (recheck B12/MMA around March 2026) - OFFICE visit in 3 months  -  Continue GI follow-up (Dr. Eartha and NP Mitzie Boettcher)   2.  Social/family history: - Lives at home by himself.  He is independent of ADLs and IADLs.  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  He has exposure to torrent chemicals.  He smoked 1 pack/day for 35 years and quit smoking in 1992. - No family history of significant anemia. - Mother had mesothelioma.  Maternal aunt had colon cancer.  Maternal uncle had adrenal gland cancer.  PLAN SUMMARY:   >> IV Feraheme x 2 (first dose today) >> Labs in 3 months = CBC/D, BMP, ferritin, iron /TIBC, BB sample >> OFFICE visit in 3 months (1 week after labs)      REVIEW OF SYSTEMS:   Review of Systems  Constitutional:  Positive for fatigue. Negative for appetite change, chills, diaphoresis, fever and unexpected weight change.  HENT:   Negative for lump/mass and nosebleeds.   Eyes:  Negative for eye problems.  Respiratory:  Positive for shortness of breath (with exertion). Negative for cough and hemoptysis.   Cardiovascular:  Negative for chest pain, leg swelling and palpitations.  Gastrointestinal:  Negative for abdominal pain, blood in stool, constipation, diarrhea, nausea and vomiting.  Genitourinary:  Negative for hematuria.   Musculoskeletal:  Positive for arthralgias (right hip).  Skin: Negative.   Neurological:  Negative for dizziness, headaches, light-headedness and numbness.  Hematological:  Does not bruise/bleed easily.  Psychiatric/Behavioral:  Positive for sleep disturbance.      PHYSICAL EXAM:  ECOG PERFORMANCE STATUS: 1 - Symptomatic but completely ambulatory  Vitals:   06/17/24 1308  BP: 101/66  Pulse: 77  Resp: 18  Temp: (!) 97.5 F (36.4 C)  SpO2: 98%     Filed Weights   06/17/24 1308  Weight: 217 lb 2.5 oz (98.5 kg)     Physical Exam Constitutional:      Appearance: Normal appearance. He is obese.  Cardiovascular:     Heart sounds: Normal heart sounds.  Pulmonary:     Breath  sounds: Normal breath sounds.  Neurological:     General: No focal deficit present.     Mental Status: Mental status is at baseline.  Psychiatric:        Behavior: Behavior normal. Behavior is cooperative.     PAST MEDICAL/SURGICAL HISTORY:  Past Medical History:  Diagnosis Date   Arthritis    Atrial fibrillation (HCC)    CAD (coronary artery disease)    a. Cath 03/17/15 showing 100% ostial D1, 50% prox LAD to mid LAD, 40% RPDA stenosis. Med rx. // Myoview  01/2020: EF 31 diffuse perfusion defect without reversibility (suspect artifact); reviewed with Dr. Suellen study felt to be low risk   Cataract    Mixed form OD   Chronic diastolic CHF (congestive heart failure) (HCC)    Chronically elevated hemidiaphragm - Right Side    Diabetic retinopathy (HCC)    NPDR OU   Essential hypertension    Glaucoma    POAG OU   History of gout    Hyperlipidemia    Hypertensive retinopathy    OU   Kidney stones    Melanoma of neck (HCC)    NICM (nonischemic cardiomyopathy) (HCC)    OSA on CPAP 2012   Prostate cancer (HCC)    Type  II diabetes mellitus (HCC)    Past Surgical History:  Procedure Laterality Date   ABDOMINAL HERNIA REPAIR     w/mesh   ATRIAL FIBRILLATION ABLATION N/A 06/06/2021   Procedure: ATRIAL FIBRILLATION ABLATION;  Surgeon: Kelsie Agent, MD;  Location: [facility] INVASIVE CV LAB;  Service: Cardiovascular;  Laterality: N/A;   BIOPSY  05/24/2022   Procedure: BIOPSY;  Surgeon: Shaaron Lamar HERO, MD;  Location: [facility];  Service: Endoscopy;;   BIOPSY  05/30/2022   Procedure: BIOPSY;  Surgeon: Shaaron Lamar HERO, MD;  [facility];  Service: Endoscopy;;   BIOPSY  01/10/2023   Procedure: BIOPSY;  Surgeon: Wilhelmenia Aloha Raddle., MD;  Location: [facility];  Service: Gastroenterology;;   CARDIAC CATHETERIZATION N/A 03/17/2015   Procedure: Left Heart Cath and Coronary Angiography;  Surgeon: Dorn JINNY Lesches, MD;  [facility]VAS[facility] CV LAB;  Service: Cardiovascular;   Laterality: N/A;   CARDIOVERSION N/A 08/09/2021   Procedure: CARDIOVERSION;  Surgeon: Raford Riggs, MD;  Location: [facility] ENDOSCOPY;  Service: Cardiovascular;  Laterality: N/A;   CARDIOVERSION N/A 11/10/2021   Procedure: CARDIOVERSION;  Surgeon: Sheena Pugh, DO;  Location: [facility] ENDOSCOPY;  Service: Cardiovascular;  Laterality: N/A;   carotid doppler  09/17/2008   rigt and left ICAs 0-49%;mildly  abnormal   CATARACT EXTRACTION Left 2020   Dr. Lelon   COLONOSCOPY N/A 05/16/2017   Procedure: COLONOSCOPY;  Surgeon: Golda Claudis PENNER, MD;  [facility]: [facility];  Service: Endoscopy;  Laterality: N/A;  930   COLONOSCOPY WITH PROPOFOL  N/A 05/24/2022   Procedure: COLONOSCOPY WITH PROPOFOL ;  Surgeon: Shaaron Lamar HERO, MD;  [facility]: [facility];  Service: Endoscopy;  Laterality: N/A;   DOPPLER ECHOCARDIOGRAPHY  05/25/2009   EF 50-55%,LA mildly dilated, LV function normal   ELECTROPHYSIOLOGIC STUDY N/A 04/05/2015   Procedure: Cardioversion;  Surgeon: Jerel Balding, MD;  Location: [facility];  Service: Cardiovascular;  Laterality: N/A;   ELECTROPHYSIOLOGIC STUDY N/A 09/06/2015   Procedure: Atrial Fibrillation Ablation;  Surgeon: Agent Kelsie, MD;  Location: [facility] INVASIVE CV LAB;  Service: Cardiovascular;  Laterality: N/A;   ELECTROPHYSIOLOGIC STUDY N/A 07/12/2016   redo afib ablation by Dr Kelsie   ENTEROSCOPY N/A 05/30/2022   Procedure: ENTEROSCOPY;  Surgeon: Shaaron Lamar HERO, MD;  [facility];  Service: Endoscopy;  Laterality: N/A;   ENTEROSCOPY N/A 01/07/2024   Procedure: ENTEROSCOPY;  Surgeon: Eartha Angelia Sieving, MD;  [facility];  Service: Gastroenterology;  Laterality: N/A;  1:45PM;ASA 3   ESOPHAGOGASTRODUODENOSCOPY (EGD) WITH PROPOFOL  N/A 05/24/2022   Procedure: ESOPHAGOGASTRODUODENOSCOPY (EGD) WITH PROPOFOL ;  Surgeon: Shaaron Lamar HERO, MD;  [facility];  Service: Endoscopy;  Laterality: N/A;   ESOPHAGOGASTRODUODENOSCOPY (EGD) WITH PROPOFOL  N/A  01/10/2023   Procedure: ESOPHAGOGASTRODUODENOSCOPY (EGD) WITH PROPOFOL ;  Surgeon: Wilhelmenia Aloha Raddle., MD;  Location: [facility]OS[facility];  Service: Gastroenterology;  Laterality: N/A;   EUS N/A 01/10/2023   Procedure: UPPER ENDOSCOPIC ULTRASOUND (EUS) RADIAL;  Surgeon: Wilhelmenia Aloha Raddle., MD;  Location: [facility] ENDOSCOPY;  Service: Gastroenterology;  Laterality: N/A;   EXCISIONAL HEMORRHOIDECTOMY     inside and out   EYE SURGERY Left 2020   Cat Sx - Dr. Lelon   FINE NEEDLE ASPIRATION Right    knee; drew ~ 1 quart off   GIVENS CAPSULE STUDY N/A 05/25/2022   Procedure: GIVENS CAPSULE STUDY;  Surgeon: Cindie Carlin POUR, DO;  [facility];  Service: Endoscopy;  Laterality: N/A;   GIVENS CAPSULE STUDY N/A 01/23/2023   Procedure: GIVENS  CAPSULE STUDY;  Surgeon: Eartha Flavors, Toribio, MD;  [facility];  Service: Gastroenterology;  Laterality: N/A;  8:30am   HERNIA REPAIR     HOT HEMOSTASIS N/A 01/10/2023   Procedure: HOT HEMOSTASIS (ARGON PLASMA COAGULATION/BICAP);  Surgeon: Wilhelmenia Aloha Raddle., MD;  Location: [facility];  Service: Gastroenterology;  Laterality: N/A;   HOT HEMOSTASIS  01/07/2024   Procedure: EGD, WITH ARGON PLASMA COAGULATION;  Surgeon: Eartha Flavors Toribio, MD;  [facility];  Service: Gastroenterology;;   LAPAROSCOPIC CHOLECYSTECTOMY     LEFT ATRIAL APPENDAGE OCCLUSION N/A 06/28/2022   Procedure: LEFT ATRIAL APPENDAGE OCCLUSION;  Surgeon: Wonda Sharper, MD;  Location: [facility] INVASIVE CV LAB;  Service: Cardiovascular;  Laterality: N/A;   MELANOMA EXCISION Right    neck   NM MYOCAR PERF WALL MOTION  02/21/2012   EF 61% ,EXERCISE 7 METS. exercise stopped due to wheezing and shortness of breathe   POLYPECTOMY  05/16/2017   Procedure: POLYPECTOMY;  Surgeon: Golda Claudis PENNER, MD;  [facility];  Service: Endoscopy;;  colon   POLYPECTOMY  05/24/2022   Procedure: POLYPECTOMY;  Surgeon: Shaaron Lamar HERO, MD;  [facility];   Service: Endoscopy;;   POLYPECTOMY  01/10/2023   Procedure: POLYPECTOMY;  Surgeon: Wilhelmenia Aloha Raddle., MD;  Location: [facility];  Service: Gastroenterology;;   PROSTATECTOMY     RIGHT/LEFT HEART CATH AND CORONARY ANGIOGRAPHY N/A 01/27/2024   Procedure: RIGHT/LEFT HEART CATH AND CORONARY ANGIOGRAPHY;  Surgeon: Zenaida Morene PARAS, MD;  Location: [facility] INVASIVE CV LAB;  Service: Cardiovascular;  Laterality: N/A;   SHOULDER OPEN ROTATOR CUFF REPAIR Right X 2   SUBMUCOSAL TATTOO INJECTION  05/30/2022   Procedure: SUBMUCOSAL TATTOO INJECTION;  Surgeon: Shaaron Lamar HERO, MD;  [facility];  Service: Endoscopy;;   SUBMUCOSAL TATTOO INJECTION  01/10/2023   Procedure: SUBMUCOSAL TATTOO INJECTION;  Surgeon: Wilhelmenia Aloha Raddle., MD;  Location: [facility];  Service: Gastroenterology;;   TEE WITHOUT CARDIOVERSION N/A 09/05/2015   Procedure: TRANSESOPHAGEAL ECHOCARDIOGRAM (TEE);  Surgeon: Wilbert JONELLE Bihari, MD;  Location: [facility] ENDOSCOPY;  Service: Cardiovascular;  Laterality: N/A;   TEE WITHOUT CARDIOVERSION N/A 06/28/2022   Procedure: TRANSESOPHAGEAL ECHOCARDIOGRAM (TEE);  Surgeon: Wonda Sharper, MD;  Location: [facility] INVASIVE CV LAB;  Service: Cardiovascular;  Laterality: N/A;    SOCIAL HISTORY:  Social History   Socioeconomic History   Marital status: Widowed    Spouse name: Not on file   Number of children: 1   Years of education: Not on file   Highest education level: GED or equivalent  Occupational History   Occupation: Retired  Tobacco Use   Smoking status: Former    Current packs/day: 0.00    Average packs/day: 2.0 packs/day for 27.0 years (54.0 ttl pk-yrs)    Types: Cigarettes    Start date: 49    Quit date: 1992    Years since quitting: 33.6    Passive exposure: Current   Smokeless tobacco: Never   Tobacco comments:    Former smoker 09/27/21  Vaping Use   Vaping status: Never Used  Substance and Sexual Activity   Alcohol use: No    Alcohol/week: 0.0 standard drinks  of alcohol    Comment: used to drink; stopped ~ 2008   Drug use: No   Sexual activity: Not Currently  Other Topics Concern   Not on file  Social History Narrative   Lives in [place], [place] with wife.   Social Drivers of Corporate investment banker Strain:  Low Risk  (02/10/2024)   Overall Financial Resource Strain (CARDIA)    Difficulty of Paying Living Expenses: Not very hard  Food Insecurity: No Food Insecurity (02/10/2024)   Hunger Vital Sign    Worried About Running Out of Food in the Last Year: Never true    Ran Out of Food in the Last Year: Never true  Transportation Needs: No Transportation Needs (02/10/2024)   PRAPARE - Administrator, Civil Service (Medical): No    Lack of Transportation (Non-Medical): No  Physical Activity: Inactive (02/10/2024)   Exercise Vital Sign    Days of Exercise per Week: 0 days    Minutes of Exercise per Session: 0 min  Stress: No Stress Concern Present (02/10/2024)   [employer] of Occupational Health - Occupational Stress Questionnaire    Feeling of Stress : Only a little  Social Connections: Moderately Integrated (02/10/2024)   Social Connection and Isolation Panel    Frequency of Communication with Friends and Family: More than three times a week    Frequency of Social Gatherings with Friends and Family: More than three times a week    Attends Religious Services: More than 4 times per year    Active Member of [employer] or Organizations: Yes    Attends Banker Meetings: More than 4 times per year    Marital Status: Widowed  Recent Concern: Social Connections - Moderately Isolated (11/20/2023)   Social Connection and Isolation Panel    Frequency of Communication with Friends and Family: More than three times a week    Frequency of Social Gatherings with Friends and Family: Once a week    Attends Religious Services: More than 4 times per year    Active Member of [employer] or Organizations: No    Attends Banker  Meetings: Never    Marital Status: Widowed  Intimate Partner Violence: Not At Risk (01/29/2024)   Humiliation, Afraid, Rape, and Kick questionnaire    Fear of Current or Ex-Partner: No    Emotionally Abused: No    Physically Abused: No    Sexually Abused: No    FAMILY HISTORY:  Family History  Problem Relation Age of Onset   Heart disease Mother    Lung cancer Mother    Heart attack Mother 68   Diabetes Father    Heart disease Father    Stroke Brother    Healthy Daughter    Colon cancer Maternal Aunt        70s    CURRENT MEDICATIONS:  Outpatient Encounter Medications as of 06/17/2024  Medication Sig   albuterol  (VENTOLIN  HFA) 108 (90 Base) MCG/ACT inhaler Inhale 2 puffs into the lungs every 6 (six) hours as needed for wheezing or shortness of breath.   ammonium lactate  (AMLACTIN DAILY) 12 % lotion Apply 1 Application topically as needed.   aspirin  81 MG chewable tablet Chew 81 mg by mouth daily.   camphor-menthol (ANTI-ITCH) lotion Apply 1 Application topically daily as needed for itching.   colchicine  0.6 MG tablet Take 2 tablets on Day 1, followed by 1 tablet 1 hour later. Please take 1 tablet once daily from Day 2 till swelling improves.   COMBIGAN  0.2-0.5 % ophthalmic solution Place 1 drop into both eyes 2 (two) times daily.   dapagliflozin  propanediol (FARXIGA ) 10 MG TABS tablet Take 1 tablet (10 mg total) by mouth daily.   diphenhydramine -acetaminophen  (TYLENOL  PM) 25-500 MG TABS tablet Take 1 tablet by mouth at bedtime.   ELDERBERRY  PO Take 2 tablets by mouth daily. Chewable   ferrous sulfate  325 (65 FE) MG tablet Take 1 tablet (325 mg total) by mouth daily with breakfast.   fluticasone  (FLONASE ) 50 MCG/ACT nasal spray Place 1 spray into both nostrils daily. SPRAY 2 SPRAYS INTO EACH NOSTRIL EVERY DAY   gemfibrozil  (LOPID ) 600 MG tablet Take 600 mg by mouth at bedtime.    glimepiride  (AMARYL ) 2 MG tablet Take 1 tablet (2 mg total) by mouth daily with breakfast. (Patient  taking differently: Take 2 mg by mouth at bedtime.)   Insulin  Glargine (BASAGLAR  KWIKPEN) 100 UNIT/ML INJECT 45 UNITS INTO THE SKIN AT BEDTIME.   magnesium  gluconate (MAGONATE) 500 MG tablet Take 500 mg by mouth daily.   methylPREDNISolone  (MEDROL  DOSEPAK) 4 MG TBPK tablet Take as package instructions.   mineral oil liquid Take 15 mLs by mouth daily as needed for moderate constipation.   Omega-3 Fatty Acids (FISH OIL EXTRA STRENGTH) 1200 MG CAPS Take 1 capsule by mouth daily.   OVER THE COUNTER MEDICATION Take 1 tablet by mouth daily. Beets Chew   pantoprazole  (PROTONIX ) 40 MG tablet TAKE 1 TABLET BY MOUTH EVERY DAY   torsemide  (DEMADEX ) 20 MG tablet 40 MG IN THE MORNING AND 20 MG IN THE EVENING (Patient taking differently: Take 20 mg by mouth 2 (two) times daily. With an extra tablet later in the day if needed)   vitamin E 45 MG (100 UNITS) capsule Take 100 Units by mouth daily.   zinc  gluconate 50 MG tablet Take 50 mg by mouth daily.   No facility-administered encounter medications on file as of 06/17/2024.    ALLERGIES:  Allergies  Allergen Reactions   Silicone Dermatitis   Other Dermatitis and Other (See Comments)    Adhesive agent (substance)   Azithromycin  Nausea Only   Tape Rash and Other (See Comments)    Causes skin redness, Use paper tape only.    LABORATORY DATA:  I have reviewed the labs as listed.  CBC    Component Value Date/Time   WBC 7.0 06/10/2024 1338   RBC 4.09 (L) 06/10/2024 1338   HGB 11.3 (L) 06/10/2024 1338   HGB 10.9 (L) 05/20/2024 1551   HCT 36.6 (L) 06/10/2024 1338   HCT 34.6 (L) 05/20/2024 1551   PLT 299 06/10/2024 1338   PLT 320 05/20/2024 1551   MCV 89.5 06/10/2024 1338   MCV 89 05/20/2024 1551   MCH 27.6 06/10/2024 1338   MCHC 30.9 06/10/2024 1338   RDW 14.2 06/10/2024 1338   RDW 14.2 05/20/2024 1551   LYMPHSABS 1.2 06/10/2024 1338   LYMPHSABS 1.5 05/20/2024 1551   MONOABS 0.5 06/10/2024 1338   EOSABS 0.3 06/10/2024 1338   EOSABS 0.2  05/20/2024 1551   BASOSABS 0.0 06/10/2024 1338   BASOSABS 0.0 05/20/2024 1551      Latest Ref Rng & Units 05/20/2024    3:51 PM 02/12/2024    1:33 PM 02/04/2024    3:26 PM  CMP  Glucose 70 - 99 mg/dL 702  741  847   BUN 8 - 27 mg/dL 17  28  32   Creatinine 0.76 - 1.27 mg/dL 8.92  8.42  7.74   Sodium 134 - 144 mmol/L 137  132  134   Potassium 3.5 - 5.2 mmol/L 3.2  5.0  5.7   Chloride 96 - 106 mmol/L 92  94  95   CO2 20 - 29 mmol/L 27  25  26    Calcium  8.6 -  10.2 mg/dL 8.7  9.2  9.1   Total Protein 6.0 - 8.5 g/dL 7.1     Total Bilirubin 0.0 - 1.2 mg/dL 0.3     Alkaline Phos 44 - 121 IU/L 146     AST 0 - 40 IU/L 20     ALT 0 - 44 IU/L 10       DIAGNOSTIC IMAGING:  I have independently reviewed the relevant imaging and discussed with the patient.   WRAP UP:  All questions were answered. The patient knows to call the clinic with any problems, questions or concerns.  Medical decision making: Moderate  Time spent on visit: I spent 20 minutes counseling the patient face to face. The total time spent in the appointment was 30 minutes and more than 50% was on counseling.  Pleasant CHRISTELLA Barefoot, PA-C  06/17/24 1:46 PM

## 2024-06-17 ENCOUNTER — Inpatient Hospital Stay

## 2024-06-17 ENCOUNTER — Inpatient Hospital Stay: Attending: Hematology | Admitting: Physician Assistant

## 2024-06-17 VITALS — BP 118/75 | HR 67 | Temp 97.2°F | Resp 18

## 2024-06-17 VITALS — BP 101/66 | HR 77 | Temp 97.5°F | Resp 18 | Wt 217.2 lb

## 2024-06-17 DIAGNOSIS — Z881 Allergy status to other antibiotic agents status: Secondary | ICD-10-CM | POA: Insufficient documentation

## 2024-06-17 DIAGNOSIS — E669 Obesity, unspecified: Secondary | ICD-10-CM | POA: Insufficient documentation

## 2024-06-17 DIAGNOSIS — K625 Hemorrhage of anus and rectum: Secondary | ICD-10-CM | POA: Diagnosis not present

## 2024-06-17 DIAGNOSIS — M25551 Pain in right hip: Secondary | ICD-10-CM | POA: Diagnosis not present

## 2024-06-17 DIAGNOSIS — Z85820 Personal history of malignant melanoma of skin: Secondary | ICD-10-CM | POA: Insufficient documentation

## 2024-06-17 DIAGNOSIS — I4891 Unspecified atrial fibrillation: Secondary | ICD-10-CM | POA: Insufficient documentation

## 2024-06-17 DIAGNOSIS — R0602 Shortness of breath: Secondary | ICD-10-CM | POA: Diagnosis not present

## 2024-06-17 DIAGNOSIS — F5089 Other specified eating disorder: Secondary | ICD-10-CM | POA: Diagnosis not present

## 2024-06-17 DIAGNOSIS — Z87442 Personal history of urinary calculi: Secondary | ICD-10-CM | POA: Insufficient documentation

## 2024-06-17 DIAGNOSIS — K5730 Diverticulosis of large intestine without perforation or abscess without bleeding: Secondary | ICD-10-CM | POA: Insufficient documentation

## 2024-06-17 DIAGNOSIS — Z8249 Family history of ischemic heart disease and other diseases of the circulatory system: Secondary | ICD-10-CM | POA: Insufficient documentation

## 2024-06-17 DIAGNOSIS — I2510 Atherosclerotic heart disease of native coronary artery without angina pectoris: Secondary | ICD-10-CM | POA: Diagnosis not present

## 2024-06-17 DIAGNOSIS — G4733 Obstructive sleep apnea (adult) (pediatric): Secondary | ICD-10-CM | POA: Insufficient documentation

## 2024-06-17 DIAGNOSIS — G479 Sleep disorder, unspecified: Secondary | ICD-10-CM | POA: Diagnosis not present

## 2024-06-17 DIAGNOSIS — I5032 Chronic diastolic (congestive) heart failure: Secondary | ICD-10-CM | POA: Insufficient documentation

## 2024-06-17 DIAGNOSIS — D631 Anemia in chronic kidney disease: Secondary | ICD-10-CM | POA: Diagnosis not present

## 2024-06-17 DIAGNOSIS — D500 Iron deficiency anemia secondary to blood loss (chronic): Secondary | ICD-10-CM

## 2024-06-17 DIAGNOSIS — Z833 Family history of diabetes mellitus: Secondary | ICD-10-CM | POA: Insufficient documentation

## 2024-06-17 DIAGNOSIS — Z800 Family history of malignant neoplasm of digestive organs: Secondary | ICD-10-CM | POA: Insufficient documentation

## 2024-06-17 DIAGNOSIS — K635 Polyp of colon: Secondary | ICD-10-CM | POA: Insufficient documentation

## 2024-06-17 DIAGNOSIS — Z87891 Personal history of nicotine dependence: Secondary | ICD-10-CM | POA: Insufficient documentation

## 2024-06-17 DIAGNOSIS — E785 Hyperlipidemia, unspecified: Secondary | ICD-10-CM | POA: Insufficient documentation

## 2024-06-17 DIAGNOSIS — E1122 Type 2 diabetes mellitus with diabetic chronic kidney disease: Secondary | ICD-10-CM | POA: Diagnosis not present

## 2024-06-17 DIAGNOSIS — R0609 Other forms of dyspnea: Secondary | ICD-10-CM | POA: Insufficient documentation

## 2024-06-17 DIAGNOSIS — K449 Diaphragmatic hernia without obstruction or gangrene: Secondary | ICD-10-CM | POA: Diagnosis not present

## 2024-06-17 DIAGNOSIS — Z801 Family history of malignant neoplasm of trachea, bronchus and lung: Secondary | ICD-10-CM | POA: Insufficient documentation

## 2024-06-17 DIAGNOSIS — E538 Deficiency of other specified B group vitamins: Secondary | ICD-10-CM | POA: Diagnosis not present

## 2024-06-17 DIAGNOSIS — Z79899 Other long term (current) drug therapy: Secondary | ICD-10-CM | POA: Insufficient documentation

## 2024-06-17 DIAGNOSIS — I130 Hypertensive heart and chronic kidney disease with heart failure and stage 1 through stage 4 chronic kidney disease, or unspecified chronic kidney disease: Secondary | ICD-10-CM | POA: Insufficient documentation

## 2024-06-17 DIAGNOSIS — K31819 Angiodysplasia of stomach and duodenum without bleeding: Secondary | ICD-10-CM | POA: Insufficient documentation

## 2024-06-17 DIAGNOSIS — N1831 Chronic kidney disease, stage 3a: Secondary | ICD-10-CM | POA: Insufficient documentation

## 2024-06-17 DIAGNOSIS — Z9049 Acquired absence of other specified parts of digestive tract: Secondary | ICD-10-CM | POA: Insufficient documentation

## 2024-06-17 DIAGNOSIS — Z823 Family history of stroke: Secondary | ICD-10-CM | POA: Insufficient documentation

## 2024-06-17 DIAGNOSIS — Z7982 Long term (current) use of aspirin: Secondary | ICD-10-CM | POA: Insufficient documentation

## 2024-06-17 DIAGNOSIS — Z9079 Acquired absence of other genital organ(s): Secondary | ICD-10-CM | POA: Insufficient documentation

## 2024-06-17 DIAGNOSIS — K649 Unspecified hemorrhoids: Secondary | ICD-10-CM | POA: Insufficient documentation

## 2024-06-17 DIAGNOSIS — R5383 Other fatigue: Secondary | ICD-10-CM | POA: Insufficient documentation

## 2024-06-17 MED ORDER — SODIUM CHLORIDE 0.9 % IV SOLN
INTRAVENOUS | Status: DC
Start: 2024-06-17 — End: 2024-06-17

## 2024-06-17 MED ORDER — SODIUM CHLORIDE 0.9 % IV SOLN
510.0000 mg | Freq: Once | INTRAVENOUS | Status: AC
  Administered 2024-06-17: 510 mg via INTRAVENOUS
  Filled 2024-06-17: qty 510

## 2024-06-17 NOTE — Progress Notes (Signed)
 Patient presents today for iron  infusion.  Patient is in satisfactory condition with no new complaints voiced.  Vital signs are stable.  IV placed in R arm.  IV flushed well with good blood return noted.  Tylenol  and Zyrtec  taken at home prior to visit.  We will proceed with infusion per provider orders.    Patient tolerated infusion well with no complaints voiced.  Patient refused to wait the recommended 30 minute post iron  wait time.  Patient left via wheelchair in stable condition.  Vital signs stable at discharge.  Follow up as scheduled.

## 2024-06-17 NOTE — Patient Instructions (Signed)
 [facility] **VISIT SUMMARY & IMPORTANT INSTRUCTIONS **   You were seen today by Pleasant Barefoot PA-C for your anemia.    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  Continue to follow-up with gastroenterology and seek immediate medical attention if you notice any signs of major blood loss (bright red blood in the toilet or black/tarry bowel movements). You may also have some mild chronic kidney disease that is contributing to your anemia. Continue to take your iron  and B12 supplements daily. We will schedule you for 2 doses of IV iron .  FOLLOW-UP APPOINTMENT: Office visit in 3 months (labs 1 week prior)   ** Thank you for trusting me with your healthcare!  I strive to provide all of my patients with quality care at each visit.  If you receive a survey for this visit, I would be so grateful to you for taking the time to provide feedback.  Thank you in advance!  ~ Dollye Glasser                                        Dr. Mickiel Davonna Pleasant Barefoot, PA-C       Delon Hope, NP   - - - - - - - - - - - - - - - - - -    Thank you for choosing [facility] to provide your oncology and hematology care.  To afford each patient quality time with our provider, please arrive at least 15 minutes before your scheduled appointment time.   If you have a lab appointment with the Cancer Center please come in thru the Main Entrance and check in at the main information desk.  You need to re-schedule your appointment should you arrive 10 or more minutes late.  We strive to give you quality time with our providers, and arriving late affects you and other patients whose appointments are after yours.  Also, if you no show three or more times for appointments you may be dismissed from the clinic at the providers discretion.     Again, thank you for  choosing [facility]  Our hope is that these requests will decrease the amount of time that you wait before being seen by our physicians.       _____________________________________________________________  Should you have questions after your visit to [facility], please contact our office at [phone] and follow the prompts.  Our office hours are 8:00 a.m. and 4:30 p.m. Monday - Friday.  Please note that voicemails left after 4:00 p.m. may not be returned until the following business day.  We are closed weekends and major holidays.  You do have access to a nurse 24-7, just call the main number to the clinic [phone] and do not press any options, hold on the line and a nurse will answer the phone.    For prescription refill requests, have your pharmacy contact our office and allow 72 hours.     Should you have questions after your visit to [facility], please contact our office at [phone] and follow the prompts.  Our office hours are 8:00 a.m. and 4:30 p.m. Monday - Friday.  Please note that voicemails left after 4:00 p.m.  may not be returned until the following business day.  We are closed weekends and major holidays.  You do have access to a nurse 24-7, just call the main number to the clinic [phone] and do not press any options, hold on the line and a nurse will answer the phone.    For prescription refill requests, have your pharmacy contact our office and allow 72 hours.

## 2024-06-17 NOTE — Patient Instructions (Signed)
 [facility] [place] - A DEPT OF [facility]  Discharge Instructions: Thank you for choosing [facility] to provide your oncology and hematology care.  If you have a lab appointment with the Cancer Center - please note that after April 8th, 2024, all labs will be drawn in the cancer center.  You do not have to check in or register with the main entrance as you have in the past but will complete your check-in in the cancer center.  Wear comfortable clothing and clothing appropriate for easy access to any Portacath or PICC line.   We strive to give you quality time with your provider. You may need to reschedule your appointment if you arrive late (15 or more minutes).  Arriving late affects you and other patients whose appointments are after yours.  Also, if you miss three or more appointments without notifying the office, you may be dismissed from the clinic at the provider's discretion.      For prescription refill requests, have your pharmacy contact our office and allow 72 hours for refills to be completed.    Today you received the following chemotherapy and/or immunotherapy agents Feraheme. Ferumoxytol Injection What is this medication? FERUMOXYTOL (FER ue MOX i tol) treats low levels of iron in your body (iron deficiency anemia). Iron is a mineral that plays an important role in making red blood cells, which carry oxygen from your lungs to the rest of your body. This medicine may be used for other purposes; ask your health care provider or pharmacist if you have questions. COMMON BRAND NAME(S): Feraheme What should I tell my care team before I take this medication? They need to know if you have any of these conditions: Anemia not caused by low iron levels High levels of iron in the blood Magnetic resonance imaging (MRI) test scheduled An unusual or allergic reaction to iron, other medications, foods, dyes, or preservatives Pregnant or trying to get  pregnant Breastfeeding How should I use this medication? This medication is injected into a vein. It is given by your care team in a hospital or clinic setting. Talk to your care team the use of this medication in children. Special care may be needed. Overdosage: If you think you have taken too much of this medicine contact a poison control center or emergency room at once. NOTE: This medicine is only for you. Do not share this medicine with others. What if I miss a dose? It is important not to miss your dose. Call your care team if you are unable to keep an appointment. What may interact with this medication? Other iron products This list may not describe all possible interactions. Give your health care provider a list of all the medicines, herbs, non-prescription drugs, or dietary supplements you use. Also tell them if you smoke, drink alcohol, or use illegal drugs. Some items may interact with your medicine. What should I watch for while using this medication? Visit your care team for regular checks on your progress. Tell your care team if your symptoms do not start to get better or if they get worse. You may need blood work done while you are taking this medication. You may need to eat more foods that contain iron. Talk to your care team. Foods that contain iron include whole grains or cereals, dried fruits, beans, peas, leafy green vegetables, and organ meats (liver, kidney). What side effects may I notice from receiving this medication? Side effects that  you should report to your care team as soon as possible: Allergic reactions--skin rash, itching, hives, swelling of the face, lips, tongue, or throat Low blood pressure--dizziness, feeling faint or lightheaded, blurry vision Shortness of breath Side effects that usually do not require medical attention (report to your care team if they continue or are bothersome): Flushing Headache Joint pain Muscle pain Nausea Pain, redness, or  irritation at injection site This list may not describe all possible side effects. Call your doctor for medical advice about side effects. You may report side effects to FDA at 1-800-FDA-1088. Where should I keep my medication? This medication is given in a hospital or clinic. It will not be stored at home. NOTE: This sheet is a summary. It may not cover all possible information. If you have questions about this medicine, talk to your doctor, pharmacist, or health care provider.  2024 Elsevier/Gold Standard (2023-05-22 00:00:00)      To help prevent nausea and vomiting after your treatment, we encourage you to take your nausea medication as directed.  BELOW ARE SYMPTOMS THAT SHOULD BE REPORTED IMMEDIATELY: *FEVER GREATER THAN 100.4 F (38 C) OR HIGHER *CHILLS OR SWEATING *NAUSEA AND VOMITING THAT IS NOT CONTROLLED WITH YOUR NAUSEA MEDICATION *UNUSUAL SHORTNESS OF BREATH *UNUSUAL BRUISING OR BLEEDING *URINARY PROBLEMS (pain or burning when urinating, or frequent urination) *BOWEL PROBLEMS (unusual diarrhea, constipation, pain near the anus) TENDERNESS IN MOUTH AND THROAT WITH OR WITHOUT PRESENCE OF ULCERS (sore throat, sores in mouth, or a toothache) UNUSUAL RASH, SWELLING OR PAIN  UNUSUAL VAGINAL DISCHARGE OR ITCHING   Items with * indicate a potential emergency and should be followed up as soon as possible or go to the Emergency Department if any problems should occur.  Please show the CHEMOTHERAPY ALERT CARD or IMMUNOTHERAPY ALERT CARD at check-in to the Emergency Department and triage nurse.  Should you have questions after your visit or need to cancel or reschedule your appointment, please contact [facility] [place] - A DEPT OF Eligha Bridegroom [facility] [phone]  and follow the prompts.  Office hours are 8:00 a.m. to 4:30 p.m. Monday - Friday. Please note that voicemails left after 4:00 p.m. may not be returned until the following business day.  We are closed weekends  and major holidays. You have access to a nurse at all times for urgent questions. Please call the main number to the clinic [phone] and follow the prompts.  For any non-urgent questions, you may also contact your provider using MyChart. We now offer e-Visits for anyone 67 and older to request care online for non-urgent symptoms. For details visit [email or web address].   Also download the MyChart app! Go to the app store, search "MyChart", open the app, select [place], and log in with your MyChart username and password.

## 2024-06-24 ENCOUNTER — Inpatient Hospital Stay

## 2024-06-24 VITALS — BP 148/78 | HR 66 | Temp 96.5°F | Resp 19

## 2024-06-24 DIAGNOSIS — D500 Iron deficiency anemia secondary to blood loss (chronic): Secondary | ICD-10-CM

## 2024-06-24 MED ORDER — ACETAMINOPHEN 325 MG PO TABS: 650.0000 mg | ORAL_TABLET | Freq: Once | ORAL | Status: DC

## 2024-06-24 MED ORDER — SODIUM CHLORIDE 0.9 % IV SOLN: INTRAVENOUS | Status: DC

## 2024-06-24 MED ORDER — SODIUM CHLORIDE 0.9 % IV SOLN
510.0000 mg | Freq: Once | INTRAVENOUS | Status: AC
  Administered 2024-06-24: 510 mg via INTRAVENOUS
  Filled 2024-06-24: qty 17

## 2024-06-24 MED ORDER — CETIRIZINE HCL 10 MG/ML IV SOLN: 5.0000 mg | Freq: Once | INTRAVENOUS | Status: DC

## 2024-06-24 NOTE — Patient Instructions (Signed)

## 2024-06-24 NOTE — Progress Notes (Signed)
 Per pt he took his premedications prior to appt.   Patient tolerated iron  infusion with no complaints voiced.  Peripheral IV site clean and dry with good blood return noted before and after infusion. Pt refused to wait the whole 30 minute post iron  observation.  VSS with discharge and left in satisfactory condition with no s/s of distress noted. All follow ups as scheduled.   Lucas Dudley

## 2024-06-30 ENCOUNTER — Ambulatory Visit

## 2024-07-09 LAB — HM DIABETES EYE EXAM

## 2024-07-13 ENCOUNTER — Other Ambulatory Visit: Payer: Self-pay | Admitting: Internal Medicine

## 2024-07-13 DIAGNOSIS — M1A00X0 Idiopathic chronic gout, unspecified site, without tophus (tophi): Secondary | ICD-10-CM

## 2024-07-20 NOTE — Progress Notes (Signed)
 [facility] - Clinic Note  07/24/2024    CHIEF COMPLAINT Patient presents for Retina Follow Up  HISTORY OF PRESENT ILLNESS: Lucas Dudley is a 83 y.o. male who presents to the clinic today for:  HPI     Retina Follow Up   Patient presents with  Diabetic Retinopathy.  In both eyes.  This started 6 weeks ago.  I, the attending physician,  performed the HPI with the patient and updated documentation appropriately.        Comments   Patient here for 6 weeks retina follow up for NPDR OU. Patient states vision fine. No eye pain. Has a new drop added. Dorzolamide  BID OU.       Last edited by Valdemar Rogue, MD on 07/27/2024  1:53 AM.    Pt states he's doing well vision wise, having issues w/ legs-back and hip pain.    Referring physician: [name], MD [address] [place],  [place] [address]  HISTORICAL INFORMATION:   Selected notes from the MEDICAL RECORD NUMBER Referred by Dr. Lelon for DEE   CURRENT MEDICATIONS: Current Outpatient Medications (Ophthalmic Drugs)  Medication Sig   COMBIGAN  0.2-0.5 % ophthalmic solution Place 1 drop into both eyes 2 (two) times daily.   No current facility-administered medications for this visit. (Ophthalmic Drugs)   Current Outpatient Medications (Other)  Medication Sig   albuterol  (VENTOLIN  HFA) 108 (90 Base) MCG/ACT inhaler Inhale 2 puffs into the lungs every 6 (six) hours as needed for wheezing or shortness of breath.   ammonium lactate  (AMLACTIN DAILY) 12 % lotion Apply 1 Application topically as needed.   aspirin  81 MG chewable tablet Chew 81 mg by mouth daily.   camphor-menthol (ANTI-ITCH) lotion Apply 1 Application topically daily as needed for itching.   colchicine  0.6 MG tablet TAKE 2 TABLETS ON DAY 1, FOLLOWED BY 1 TABLET 1 HOUR LATER. PLEASE TAKE 1 TABLET ONCE DAILY FROM DAY 2 TILL SWELLING IMPROVES.   dapagliflozin  propanediol (FARXIGA ) 10 MG TABS tablet Take 1 tablet (10 mg total) by mouth daily.    diphenhydramine -acetaminophen  (TYLENOL  PM) 25-500 MG TABS tablet Take 1 tablet by mouth at bedtime.   ELDERBERRY PO Take 2 tablets by mouth daily. Chewable   ferrous sulfate  325 (65 FE) MG tablet Take 1 tablet (325 mg total) by mouth daily with breakfast.   fluticasone  (FLONASE ) 50 MCG/ACT nasal spray Place 1 spray into both nostrils daily. SPRAY 2 SPRAYS INTO EACH NOSTRIL EVERY DAY   gemfibrozil  (LOPID ) 600 MG tablet Take 600 mg by mouth at bedtime.    glimepiride  (AMARYL ) 2 MG tablet Take 1 tablet (2 mg total) by mouth daily with breakfast. (Patient taking differently: Take 2 mg by mouth at bedtime.)   Insulin  Glargine (BASAGLAR  KWIKPEN) 100 UNIT/ML INJECT 45 UNITS INTO THE SKIN AT BEDTIME.   magnesium  gluconate (MAGONATE) 500 MG tablet Take 500 mg by mouth daily.   mineral oil liquid Take 15 mLs by mouth daily as needed for moderate constipation.   Omega-3 Fatty Acids (FISH OIL EXTRA STRENGTH) 1200 MG CAPS Take 1 capsule by mouth daily.   OVER THE COUNTER MEDICATION Take 1 tablet by mouth daily. Beets Chew   pantoprazole  (PROTONIX ) 40 MG tablet TAKE 1 TABLET BY MOUTH EVERY DAY   torsemide  (DEMADEX ) 20 MG tablet 40 MG IN THE MORNING AND 20 MG IN THE EVENING (Patient taking differently: Take 20 mg by mouth 2 (two) times daily. With an extra tablet later in  the day if needed)   vitamin E 45 MG (100 UNITS) capsule Take 100 Units by mouth daily.   zinc  gluconate 50 MG tablet Take 50 mg by mouth daily.   methylPREDNISolone  (MEDROL  DOSEPAK) 4 MG TBPK tablet Take as package instructions.   No current facility-administered medications for this visit. (Other)   REVIEW OF SYSTEMS: ROS   Positive for: Gastrointestinal, Endocrine, Cardiovascular, Eyes, Respiratory Negative for: Constitutional, Neurological, Skin, Genitourinary, Musculoskeletal, HENT, Psychiatric, Allergic/Imm, Heme/Lymph Last edited by [name], COA on 07/24/2024  2:02 PM.       ALLERGIES Allergies  Allergen Reactions    Silicone Dermatitis   Other Dermatitis and Other (See Comments)    Adhesive agent (substance)   Azithromycin  Nausea Only   Tape Rash and Other (See Comments)    Causes skin redness, Use paper tape only.   PAST MEDICAL HISTORY Past Medical History:  Diagnosis Date   Arthritis    Atrial fibrillation (HCC)    CAD (coronary artery disease)    a. Cath 03/17/15 showing 100% ostial D1, 50% prox LAD to mid LAD, 40% RPDA stenosis. Med rx. // Myoview  01/2020: EF 31 diffuse perfusion defect without reversibility (suspect artifact); reviewed with Dr. Suellen study felt to be low risk   Cataract    Mixed form OD   Chronic diastolic CHF (congestive heart failure) (HCC)    Chronically elevated hemidiaphragm - Right Side    Diabetic retinopathy (HCC)    NPDR OU   Essential hypertension    Glaucoma    POAG OU   History of gout    Hyperlipidemia    Hypertensive retinopathy    OU   Kidney stones    Melanoma of neck (HCC)    NICM (nonischemic cardiomyopathy) (HCC)    OSA on CPAP 2012   Prostate cancer (HCC)    Type II diabetes mellitus (HCC)    Past Surgical History:  Procedure Laterality Date   ABDOMINAL HERNIA REPAIR     w/mesh   ATRIAL FIBRILLATION ABLATION N/A 06/06/2021   Procedure: ATRIAL FIBRILLATION ABLATION;  Surgeon: Kelsie Agent, MD;  Location: [facility]ASIVE CV LAB;  Service: Cardiovascular;  Laterality: N/A;   BIOPSY  05/24/2022   Procedure: BIOPSY;  Surgeon: Shaaron Lamar HERO, MD;  Location: [facility];  Service: Endoscopy;;   BIOPSY  05/30/2022   Procedure: BIOPSY;  Surgeon: Shaaron Lamar HERO, MD;  [facility]: [facility];  Service: Endoscopy;;   BIOPSY  01/10/2023   Procedure: BIOPSY;  Surgeon: Wilhelmenia Aloha Raddle., MD;  Location: [facility];  Service: Gastroenterology;;   CARDIAC CATHETERIZATION N/A 03/17/2015   Procedure: Left Heart Cath and Coronary Angiography;  Surgeon: Dorn JINNY Lesches, MD;  Location: [facility] INVASIVE CV LAB;  Service: Cardiovascular;  Laterality: N/A;    CARDIOVERSION N/A 08/09/2021   Procedure: CARDIOVERSION;  Surgeon: Raford Riggs, MD;  Location: [facility] ENDOSCOPY;  Service: Cardiovascular;  Laterality: N/A;   CARDIOVERSION N/A 11/10/2021   Procedure: CARDIOVERSION;  Surgeon: Sheena Pugh, DO;  Location: [facility]OS[facility];  Service: Cardiovascular;  Laterality: N/A;   carotid doppler  09/17/2008   rigt and left ICAs 0-49%;mildly  abnormal   CATARACT EXTRACTION Left 2020   Dr. Lelon   COLONOSCOPY N/A 05/16/2017   Procedure: COLONOSCOPY;  Surgeon: Golda Claudis PENNER, MD;  [facility]: [facility];  Service: Endoscopy;  Laterality: N/A;  930   COLONOSCOPY WITH PROPOFOL  N/A 05/24/2022   Procedure: COLONOSCOPY WITH PROPOFOL ;  Surgeon: Shaaron Lamar HERO, MD;  [facility]: [facility];  Service: Endoscopy;  Laterality: N/A;   DOPPLER ECHOCARDIOGRAPHY  05/25/2009   EF 50-55%,LA mildly dilated, LV function normal   ELECTROPHYSIOLOGIC STUDY N/A 04/05/2015   Procedure: Cardioversion;  Surgeon: Jerel Balding, MD;  Location: [facility];  Service: Cardiovascular;  Laterality: N/A;   ELECTROPHYSIOLOGIC STUDY N/A 09/06/2015   Procedure: Atrial Fibrillation Ablation;  Surgeon: [name], MD;  Location: [facility] INVASIVE CV LAB;  Service: Cardiovascular;  Laterality: N/A;   ELECTROPHYSIOLOGIC STUDY N/A 07/12/2016   redo afib ablation by Dr Rakers   ENTEROSCOPY N/A 05/30/2022   Procedure: ENTEROSCOPY;  Surgeon: Shaaron Lamar HERO, MD;  [facility];  Service: Endoscopy;  Laterality: N/A;   ENTEROSCOPY N/A 01/07/2024   Procedure: ENTEROSCOPY;  Surgeon: Eartha Angelia Sieving, MD;  [facility];  Service: Gastroenterology;  Laterality: N/A;  1:45PM;ASA 3   ESOPHAGOGASTRODUODENOSCOPY (EGD) WITH PROPOFOL  N/A 05/24/2022   Procedure: ESOPHAGOGASTRODUODENOSCOPY (EGD) WITH PROPOFOL ;  Surgeon: Shaaron Lamar HERO, MD;  [facility]: [facility];  Service: Endoscopy;  Laterality: N/A;   ESOPHAGOGASTRODUODENOSCOPY (EGD) WITH PROPOFOL  N/A 01/10/2023   Procedure:  ESOPHAGOGASTRODUODENOSCOPY (EGD) WITH PROPOFOL ;  Surgeon: Wilhelmenia Aloha Raddle., MD;  Location: [facility];  Service: Gastroenterology;  Laterality: N/A;   EUS N/A 01/10/2023   Procedure: UPPER ENDOSCOPIC ULTRASOUND (EUS) RADIAL;  Surgeon: Wilhelmenia Aloha Raddle., MD;  Location: [facility];  Service: Gastroenterology;  Laterality: N/A;   EXCISIONAL HEMORRHOIDECTOMY     inside and out   EYE SURGERY Left 2020   Cat Sx - Dr. Lelon   FINE NEEDLE ASPIRATION Right    knee; drew ~ 1 quart off   GIVENS CAPSULE STUDY N/A 05/25/2022   Procedure: GIVENS CAPSULE STUDY;  Surgeon: Cindie Carlin POUR, DO;  [facility];  Service: Endoscopy;  Laterality: N/A;   GIVENS CAPSULE STUDY N/A 01/23/2023   Procedure: GIVENS CAPSULE STUDY;  Surgeon: Eartha Angelia Sieving, MD;  [facility];  Service: Gastroenterology;  Laterality: N/A;  8:30am   HERNIA REPAIR     HOT HEMOSTASIS N/A 01/10/2023   Procedure: HOT HEMOSTASIS (ARGON PLASMA COAGULATION/BICAP);  Surgeon: Wilhelmenia Aloha Raddle., MD;  Location: [facility];  Service: Gastroenterology;  Laterality: N/A;   HOT HEMOSTASIS  01/07/2024   Procedure: EGD, WITH ARGON PLASMA COAGULATION;  Surgeon: Eartha Angelia Sieving, MD;  [facility];  Service: Gastroenterology;;   LAPAROSCOPIC CHOLECYSTECTOMY     LEFT ATRIAL APPENDAGE OCCLUSION N/A 06/28/2022   Procedure: LEFT ATRIAL APPENDAGE OCCLUSION;  Surgeon: Wonda Sharper, MD;  Location: [facility] INVASIVE CV LAB;  Service: Cardiovascular;  Laterality: N/A;   MELANOMA EXCISION Right    neck   NM MYOCAR PERF WALL MOTION  02/21/2012   EF 61% ,EXERCISE 7 METS. exercise stopped due to wheezing and shortness of breathe   POLYPECTOMY  05/16/2017   Procedure: POLYPECTOMY;  Surgeon: Golda Claudis PENNER, MD;  [facility];  Service: Endoscopy;;  colon   POLYPECTOMY  05/24/2022   Procedure: POLYPECTOMY;  Surgeon: Shaaron Lamar HERO, MD;  [facility];  Service: Endoscopy;;    POLYPECTOMY  01/10/2023   Procedure: POLYPECTOMY;  Surgeon: Wilhelmenia Aloha Raddle., MD;  Location: [facility];  Service: Gastroenterology;;   PROSTATECTOMY     RIGHT/LEFT HEART CATH AND CORONARY ANGIOGRAPHY N/A 01/27/2024   Procedure: RIGHT/LEFT HEART CATH AND CORONARY ANGIOGRAPHY;  Surgeon: Zenaida Morene PARAS, MD;  Location: [facility] INVASIVE CV LAB;  Service: Cardiovascular;  Laterality: N/A;   SHOULDER OPEN ROTATOR CUFF REPAIR Right X 2  SUBMUCOSAL TATTOO INJECTION  05/30/2022   Procedure: SUBMUCOSAL TATTOO INJECTION;  Surgeon: Shaaron Lamar HERO, MD;  [facility];  Service: Endoscopy;;   SUBMUCOSAL TATTOO INJECTION  01/10/2023   Procedure: SUBMUCOSAL TATTOO INJECTION;  Surgeon: Wilhelmenia Aloha Raddle., MD;  Location: [facility];  Service: Gastroenterology;;   TEE WITHOUT CARDIOVERSION N/A 09/05/2015   Procedure: TRANSESOPHAGEAL ECHOCARDIOGRAM (TEE);  Surgeon: Wilbert JONELLE Bihari, MD;  Location: [facility] ENDOSCOPY;  Service: Cardiovascular;  Laterality: N/A;   TEE WITHOUT CARDIOVERSION N/A 06/28/2022   Procedure: TRANSESOPHAGEAL ECHOCARDIOGRAM (TEE);  Surgeon: Wonda Sharper, MD;  Location: [facility] INVASIVE CV LAB;  Service: Cardiovascular;  Laterality: N/A;   FAMILY HISTORY Family History  Problem Relation Age of Onset   Heart disease Mother    Lung cancer Mother    Heart attack Mother 93   Diabetes Father    Heart disease Father    Stroke Brother    Healthy Daughter    Colon cancer Maternal Aunt        64s   SOCIAL HISTORY Social History   Tobacco Use   Smoking status: Former    Current packs/day: 0.00    Average packs/day: 2.0 packs/day for 27.0 years (54.0 ttl pk-yrs)    Types: Cigarettes    Start date: 9    Quit date: 1992    Years since quitting: 33.8    Passive exposure: Current   Smokeless tobacco: Never   Tobacco comments:    Former smoker 09/27/21  Vaping Use   Vaping status: Never Used  Substance Use Topics   Alcohol use: No    Alcohol/week: 0.0 standard drinks  of alcohol    Comment: used to drink; stopped ~ 2008   Drug use: No       OPHTHALMIC EXAM: Base Eye Exam     Visual Acuity (Snellen - Linear)       Right Left   Dist cc 20/20 -2 20/20    Correction: Glasses         Tonometry (Tonopen, 2:00 PM)       Right Left   Pressure 20 15         Pupils       Dark Light Shape React APD   Right 2 1 Round Brisk None   Left 2 1 Round Brisk None         Visual Fields (Counting fingers)       Left Right    Full Full         Extraocular Movement       Right Left    Full, Ortho Full, Ortho         Neuro/Psych     Oriented x3: Yes   Mood/Affect: Normal         Dilation     Both eyes: 1.0% Mydriacyl, 2.5% Phenylephrine  @ 2:00 PM           Slit Lamp and Fundus Exam     Slit Lamp Exam       Right Left   Lids/Lashes Dermato, mild MGD Dermato, mild MGD   Conjunctiva/Sclera Temporal pinguecula, mild inferior sub conj heme Temporal pinguecula   Cornea EBMD, mild haze, trace PEE, mild Debris in tear film, well healed cataract wound trace haze, trace PEE, well healed temporal cataract wounds, arcus   Anterior Chamber Deep and clear; narrow temporal angle Deep and clear   Iris Round and dilated, mild anterior bowing, No NVI Round and moderately dilated to 5.24mm  Lens PCIOL in good position, trace PCO PC IOL in good position with open PC   Anterior Vitreous Synerisis Synerisis         Fundus Exam       Right Left   Disc Mild pallor, sharp rim, +cupping, thin inferior rim Mild pallor, sharp rim, +cupping, +PPA   C/D Ratio 0.7 0.7   Macula Flat, good foveal reflex, trace cystic changes - persistent, RPE mottling and clumping, no heme, Drusen, no exudates Flat, blunted foveal reflex, trace cystic changes and punctate MA-stably improved   Vessels attenuated, Tortuous attenuated, Tortuous   Periphery Attached, rare MA, focal DBH nasal to disc--improved Attached. No heme.           IMAGING AND  PROCEDURES  Imaging and Procedures for 07/24/2024  OCT, Retina - OU - Both Eyes       Right Eye Quality was good. Central Foveal Thickness: 267. Progression has been stable. Findings include normal foveal contour, no SRF, intraretinal fluid, vitreomacular adhesion (Trace cystic changes ST fovea, partial PVD).   Left Eye Quality was good. Central Foveal Thickness: 266. Progression has improved. Findings include normal foveal contour, no IRF, no SRF, intraretinal hyper-reflective material (persistent cystic changes and punctate IRHM--slightly improved, almost resolved, partial PVD).   Notes *Images captured and stored on drive  Diagnosis / Impression:  +DME OU OD: Trace cystic changes ST fovea, partial PVD OS: persistent cystic changes and punctate IRHM--slightly improved, almost resolved, partial PVD  Clinical management:  See below  Abbreviations: NFP - Normal foveal profile. CME - cystoid macular edema. PED - pigment epithelial detachment. IRF - intraretinal fluid. SRF - subretinal fluid. EZ - ellipsoid zone. ERM - epiretinal membrane. ORA - outer retinal atrophy. ORT - outer retinal tubulation. SRHM - subretinal hyper-reflective material. IRHM - intraretinal hyper-reflective material     Intravitreal Injection, Pharmacologic Agent - OD - Right Eye       Time Out 07/24/2024. 3:03 PM. Confirmed correct patient, procedure, site, and patient consented.   Anesthesia Topical anesthesia was used. Anesthetic medications included Lidocaine  2%, Proparacaine 0.5%.   Procedure Preparation included 5% betadine to ocular surface, eyelid speculum. A supplied needle was used.   Injection: 6 mg faricimab -svoa 6 MG/0.05ML (Patient supplied)   Route: Intravitreal, Site: Right Eye   NDC: 49757-903-93, Lot: [id number], Expiration date: 07/14/2025, Waste: 0 mL   Post-op Post injection exam found visual acuity of at least counting fingers. The patient tolerated the procedure well. There were no  complications. The patient received written and verbal post procedure care education. Post injection medications were not given.   Notes **PAP MEDICATION ADMINISTERED**      Intravitreal Injection, Pharmacologic Agent - OS - Left Eye       Time Out 07/24/2024. 3:05 PM. Confirmed correct patient, procedure, site, and patient consented.   Anesthesia Topical anesthesia was used. Anesthetic medications included Lidocaine  2%, Proparacaine 0.5%.   Procedure Preparation included 5% betadine to ocular surface, eyelid speculum. A supplied (32g) needle was used.   Injection: 6 mg faricimab -svoa 6 MG/0.05ML (Patient supplied)   Route: Intravitreal, Site: Left Eye   NDC: 49757-903-93, Lot: [id number], Expiration date: 07/14/2025, Waste: 0 mL   Post-op Post injection exam found visual acuity of at least counting fingers. The patient tolerated the procedure well. There were no complications. The patient received written and verbal post procedure care education. Post injection medications were not given.   Notes **PAP MEDICATION ADMINISTERED**  ASSESSMENT/PLAN:   ICD-10-CM   1. 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, associated with type 2 diabetes mellitus (HCC)  E11.3213 OCT, Retina - OU - Both Eyes    Intravitreal Injection, Pharmacologic Agent - OD - Right Eye    Intravitreal Injection, Pharmacologic Agent - OS - Left Eye    faricimab -svoa (VABYSMO ) 6mg /0.67mL intravitreal injection    faricimab -svoa (VABYSMO ) 6mg /0.1mL intravitreal injection    2. Current use of insulin  (HCC)  Z79.4     3. Long term (current) use of oral hypoglycemic drugs  Z79.84     4. Essential hypertension  I10     5. Hypertensive retinopathy of both eyes  H35.033     6. Pseudophakia of both eyes  Z96.1     7. 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 History of herpes zoster of eye  Z86.19     9. Primary open angle glaucoma  of both eyes, unspecified glaucoma stage  H40.1130      1-3. 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 Single focal MA superior to fovea; OS: Perifoveal Mas w/ late leakage - s/p IVA OS # 1(07.26.21), #2 (08.24.21), #3 (09.21.21), #4 (10.22.21) -- IVA resistance ============================= - s/p IVE OD #1 (09.29.23), #2 (10.30.23), #3 (SAMPLE) (11.27.23) #4 (12.29.23) #5 (01.26.24) #6 (02.23.24) -- IVE resistance - s/p IVE OS #1 (11.22.21), #2 (12.21.21), #3 (01.20.22), #4 (02.18.22), #5 (03.18.22), #6 (04.15.22), #7 (05.10.22) -- sample, #8 (06.08.22), #9 (07.12.22), #10 (08.12.22), #11 (09.16.22), #12 (10.21.22), #13 (11.18.22), #14 (12.21.22), #15 (01.20.23), #16 (02.17.23), #17 (03.24.23), #18 (04.21.23), #19 (05.26.23), #20 (06.23.23) -- IVE resistance ============================ - s/p IVV OD #1 (03.22.24), #2 (04.19.24), #3 (05.17.24), #4 (06.18.24), #5 (07.19.24), #6 (08.23.24), #7 (09.27.24), #8 (11.01.24), #9 (12.06.24), #10 (01.10.25 -- sample), #11 (02.14.25 -- sample) #12 (03.21.25 -- sample), #13 (04.25.25 -- sample) #14(05.30.25), #15 (07.11.25--PAP) #16(08.26.25) sample  - s/p IVV OS #1 (07.21.23), #2 (08.25.23), #3 (09.29.23), #4 (10.30.23), #5 (11.27.23) #6 (12.29.23), #7 (01.26.23), #8 (02.23.24), #9 (03.22.24), #10 (04.19.24), #11 (05.17.24), #12 (12.06.24), #13 (02.14.25 -- sample), #14 (03.21.25 -- sample),  #15 (04.25.25 -- sample), #16 (05.30.25 -- sample), #17 (07.11.25--PAP) #18(08.26.25) sample  ============================ - BCVA OD: 20/20 -- stable; OS improved to 20/20 from 20/30 - OCT shows OD: Trace cystic changes ST fovea,  partial PVD; OS: persistent cystic changes and punctate IRHM--slighlty improved, partial PVD at 6 weeks - recommend IVV OD #17 and IVV OS #19 (PAP MEDS) today, 10.10.25 with f/u ext to 6-7 weeks [[employer] Days funding unavailable)  - pt wishes to proceed with injections OU  - RBA of procedure discussed,  questions answered  - Vabysmo  informed consent obtained and signed, 02.14.25 (OU) - [employer] approved Vabysmo , no good days for 2025 as of now - see procedure note - pt has PAP medication - f/u 6-7 weeks -- DFE/OCT, possible injection(s)  4,5. Hypertensive retinopathy OU - discussed importance of tight BP control - continue to monitor  6. Pseudophakia OU  - s/p CE/IOL OS (2020, Dr. Lelon)             - s/p CE/IOL OD (2022, Dr. 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 Caresse in August 2022  -  s/p SuperK OS on 12.12.2022  8. History of herpes zoster/iritis OS  - on valtrex  1g daily--maintenance  9. POAG OU  - was under the expert management of Dr. Lelon, now follows with Dr. Fleeta  - IOP 20,15  - continue cosopt  bid OU  - Dr. Fleeta added orange top gtt  Ophthalmic Meds Ordered this visit:  Meds ordered this encounter  Medications   faricimab -svoa (VABYSMO ) 6mg /0.42mL intravitreal injection   faricimab -svoa (VABYSMO ) 6mg /0.46mL intravitreal injection     This document serves as a record of services personally performed by Redell JUDITHANN Hans, MD, PhD. It was created on their behalf by Delon Newness COT, an ophthalmic technician. The creation of this record is the provider's dictation and/or activities during the visit.    Electronically signed by: Delon Newness COT 10.06.25  1:58 AM  This document serves as a record of services personally performed by Redell JUDITHANN Hans, MD, PhD. It was created on their behalf by Almetta Pesa, an ophthalmic technician. The creation of this record is the provider's dictation and/or activities during the visit.    Electronically signed by: Almetta Pesa, OA, 07/27/24  1:58 AM  Redell JUDITHANN Hans, M.D., Ph.D. Diseases & Surgery of the Retina and Vitreous [facility] 07/24/2024   I have reviewed the above documentation for accuracy and completeness, and I agree with the above. Redell JUDITHANN Hans, M.D., Ph.D. 07/27/24 2:00 AM   Abbreviations: M myopia (nearsighted); A astigmatism; H hyperopia (farsighted); P presbyopia; Mrx spectacle prescription;  CTL contact lenses; OD right eye; OS left eye; OU both eyes  XT exotropia; ET esotropia; PEK punctate epithelial keratitis; PEE punctate epithelial erosions; DES dry eye syndrome; MGD meibomian gland dysfunction; ATs artificial tears; PFAT's preservative free artificial tears; NSC nuclear sclerotic cataract; PSC posterior subcapsular cataract; ERM epi-retinal membrane; PVD posterior vitreous detachment; RD retinal detachment; DM diabetes mellitus; DR diabetic retinopathy; NPDR non-proliferative diabetic retinopathy; PDR proliferative diabetic retinopathy; CSME clinically significant macular edema; DME diabetic macular edema; dbh dot blot hemorrhages; CWS cotton wool spot; POAG primary open angle glaucoma; C/D cup-to-disc ratio; HVF humphrey visual field; GVF goldmann visual field; OCT optical coherence tomography; IOP intraocular pressure; BRVO Branch retinal vein occlusion; CRVO central retinal vein occlusion; CRAO central retinal artery occlusion; BRAO branch retinal artery occlusion; RT retinal tear; SB scleral buckle; PPV pars plana vitrectomy; VH Vitreous hemorrhage; PRP panretinal laser photocoagulation; IVK intravitreal kenalog; VMT vitreomacular traction; MH Macular hole;  NVD neovascularization of the disc; NVE neovascularization elsewhere; AREDS age related eye disease study; ARMD age related macular degeneration; POAG primary open angle glaucoma; EBMD epithelial/anterior basement membrane dystrophy; ACIOL anterior chamber intraocular lens; IOL intraocular lens; PCIOL posterior chamber intraocular lens; Phaco/IOL phacoemulsification with intraocular lens placement; PRK photorefractive keratectomy; LASIK laser  assisted in situ keratomileusis; HTN hypertension; DM diabetes mellitus; COPD chronic obstructive pulmonary disease

## 2024-07-24 ENCOUNTER — Ambulatory Visit (INDEPENDENT_AMBULATORY_CARE_PROVIDER_SITE_OTHER): Admitting: Ophthalmology

## 2024-07-24 ENCOUNTER — Encounter (INDEPENDENT_AMBULATORY_CARE_PROVIDER_SITE_OTHER): Payer: Self-pay | Admitting: Ophthalmology

## 2024-07-24 DIAGNOSIS — Z7984 Long term (current) use of oral hypoglycemic drugs: Secondary | ICD-10-CM | POA: Diagnosis not present

## 2024-07-24 DIAGNOSIS — Z794 Long term (current) use of insulin: Secondary | ICD-10-CM | POA: Diagnosis not present

## 2024-07-24 DIAGNOSIS — Z8619 Personal history of other infectious and parasitic diseases: Secondary | ICD-10-CM

## 2024-07-24 DIAGNOSIS — E113213 Type 2 diabetes mellitus with mild nonproliferative diabetic retinopathy with macular edema, bilateral: Secondary | ICD-10-CM | POA: Diagnosis not present

## 2024-07-24 DIAGNOSIS — I10 Essential (primary) hypertension: Secondary | ICD-10-CM

## 2024-07-24 DIAGNOSIS — H401130 Primary open-angle glaucoma, bilateral, stage unspecified: Secondary | ICD-10-CM

## 2024-07-24 DIAGNOSIS — H35033 Hypertensive retinopathy, bilateral: Secondary | ICD-10-CM

## 2024-07-24 DIAGNOSIS — H18523 Epithelial (juvenile) corneal dystrophy, bilateral: Secondary | ICD-10-CM

## 2024-07-24 DIAGNOSIS — Z961 Presence of intraocular lens: Secondary | ICD-10-CM

## 2024-07-27 ENCOUNTER — Encounter (INDEPENDENT_AMBULATORY_CARE_PROVIDER_SITE_OTHER): Payer: Self-pay | Admitting: Ophthalmology

## 2024-07-27 MED ORDER — FARICIMAB-SVOA 6 MG/0.05ML IZ SOSY
6.0000 mg | PREFILLED_SYRINGE | INTRAVITREAL | Status: AC | PRN
  Administered 2024-07-27: 6 mg via INTRAVITREAL

## 2024-07-28 ENCOUNTER — Other Ambulatory Visit (INDEPENDENT_AMBULATORY_CARE_PROVIDER_SITE_OTHER): Payer: Self-pay | Admitting: Gastroenterology

## 2024-07-29 ENCOUNTER — Encounter (INDEPENDENT_AMBULATORY_CARE_PROVIDER_SITE_OTHER): Payer: Self-pay | Admitting: Gastroenterology

## 2024-08-12 ENCOUNTER — Ambulatory Visit: Attending: Cardiology | Admitting: Cardiology

## 2024-08-12 ENCOUNTER — Other Ambulatory Visit (HOSPITAL_BASED_OUTPATIENT_CLINIC_OR_DEPARTMENT_OTHER): Payer: Self-pay

## 2024-08-12 ENCOUNTER — Encounter: Payer: Self-pay | Admitting: Cardiology

## 2024-08-12 VITALS — BP 120/70 | HR 68 | Ht 70.0 in | Wt 224.0 lb

## 2024-08-12 DIAGNOSIS — I25119 Atherosclerotic heart disease of native coronary artery with unspecified angina pectoris: Secondary | ICD-10-CM

## 2024-08-12 DIAGNOSIS — I4819 Other persistent atrial fibrillation: Secondary | ICD-10-CM

## 2024-08-12 DIAGNOSIS — R5383 Other fatigue: Secondary | ICD-10-CM

## 2024-08-12 DIAGNOSIS — N1832 Chronic kidney disease, stage 3b: Secondary | ICD-10-CM | POA: Diagnosis not present

## 2024-08-12 DIAGNOSIS — I5032 Chronic diastolic (congestive) heart failure: Secondary | ICD-10-CM

## 2024-08-12 DIAGNOSIS — G4733 Obstructive sleep apnea (adult) (pediatric): Secondary | ICD-10-CM

## 2024-08-12 MED ORDER — EZETIMIBE 10 MG PO TABS: 10.0000 mg | ORAL_TABLET | Freq: Every day | ORAL | 3 refills | Status: AC

## 2024-08-12 MED ORDER — EZETIMIBE 10 MG PO TABS
10.0000 mg | ORAL_TABLET | Freq: Every day | ORAL | 3 refills | Status: DC
  Filled 2024-08-12: qty 90, 90d supply, fill #0

## 2024-08-12 NOTE — Patient Instructions (Addendum)
 Medication Instructions:  Your physician has recommended you make the following change in your medication:  Start zetia 10 mg daily Continue all other medications as prescribed  Labwork: TSH, Free T3 & Free T4 Non-[facility]ing [facility] ([email or web address] [place])  Testing/Procedures: none  Follow-Up: Your physician recommends that you schedule a follow-up appointment in: 3 months  Any Other Special Instructions Will Be Listed Below (If Applicable).  If you need a refill on your cardiac medications before your next appointment, please call your pharmacy.

## 2024-08-12 NOTE — Progress Notes (Signed)
 Cardiology Office Note  Date: 08/12/2024   ID: Page, Lancon 10-11-1941, MRN [id number]  History of Present Illness: Lucas Dudley is an 83 y.o. male last seen in August by Ms. Miriam NP, I reviewed her note.  He is here today with his daughter for a follow-up visit.  Complains of leg fatigue and paresthesias suggestive at least of a component of neuropathy.  May also be a situation where he would benefit from PT.  His daughter asked about checking his thyroid  status (TSH was normal in 2023).  He does not specifically complain of increasing shortness of breath, has had some intermittent fluid retention in his lower legs but his weight has fluctuated up and down without consistent upward trend.  I reviewed his interval cardiac testing including echocardiogram and cardiac catheterization.  Heart failure team recommended medical therapy for HFpEF.  He uses inhalers and also CPAP at nighttime.  Currently taking Demadex  20 mg twice daily on most days, sometimes with an extra dose, also on Farxiga  10 mg daily.  He stopped Lopid  in the interim, we discussed a trial of Zetia 10 mg daily.  He does have a history of statin intolerance.  I talked with him about possibility of considering Kerendia.  I reviewed his interval lab work.  Physical Exam: VS:  BP 120/70   Pulse 68   Ht 5' 10 (1.778 m)   Wt 224 lb (101.6 kg)   SpO2 100%   BMI 32.14 kg/m , BMI Body mass index is 32.14 kg/m.  Wt Readings from Last 3 Encounters:  08/12/24 224 lb (101.6 kg)  06/17/24 217 lb 2.5 oz (98.5 kg)  05/20/24 222 lb 9.6 oz (101 kg)    General: Patient appears comfortable at rest. HEENT: Conjunctiva and lids normal. Neck: Supple, no elevated JVP or carotid bruits. Lungs: Decreased breath sounds without wheezing, nonlabored breathing at rest. Cardiac: Irregular, rate controlled, no gallop. Extremities: Ankle edema noted, dominantly left-sided.  ECG:  An ECG dated 02/04/2024 was personally reviewed today and  demonstrated:  Sinus bradycardia with prolonged PR interval and PAC, low voltage.  Labwork: 01/13/2024: B Natriuretic Peptide 327.0 01/23/2024: Magnesium  1.9 05/20/2024: ALT 10; AST 20; BUN 17; Creatinine, Ser 1.07; Potassium 3.2; Sodium 137 06/10/2024: Hemoglobin 11.3; Platelets 299     Component Value Date/Time   CHOL 133 01/24/2024 0312   CHOL 147 07/22/2023 1034   TRIG 91 01/24/2024 0312   HDL 26 (L) 01/24/2024 0312   HDL 24 (L) 07/22/2023 1034   CHOLHDL 5.1 01/24/2024 0312   VLDL 18 01/24/2024 0312   LDLCALC 89 01/24/2024 0312   LDLCALC 87 07/22/2023 1034   LDLDIRECT 83 01/24/2024 0312   Other Studies Reviewed Today:  Left and right heart catheterization 01/27/2024:   Prox RCA to Mid RCA lesion is 40% stenosed.   RPDA lesion is 60% stenosed.   HEMODYNAMICS: RA:       12 mmHg (mean) RV:       61/8, 12 mmHg PA:       63/17 mmHg (32 mean) PCWP: 16 mmHg (mean) with v waves to 26                                      Estimated Fick CO/CI   5.78L/min, 2.59L/min/m2  TPG  16  mmHg                                               PVR  2.7 Wood Units  PAPi  3.83       IMPRESSION: Moderate, nonobstructive coronary artery disease with mild progression from 2016. 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 Well compensated RV function. Normal cardiac output/index  Echocardiogram 02/11/2024:  1. Limited study.   2. Left ventricular ejection fraction, by estimation, is 55 to 60%. The  left ventricle has normal function. The left ventricle has no regional  wall motion abnormalities. There is moderate concentric left ventricular  hypertrophy. Left ventricular  diastolic parameters are indeterminate.   3. Right ventricular systolic function is mildly reduced. The right  ventricular size is  normal. There is moderately elevated pulmonary artery  systolic pressure. The estimated right ventricular systolic pressure is  47.4 mmHg.   4. A small pericardial effusion is present. The pericardial effusion is  posterior to the left ventricle.   5. Mild mitral valve regurgitation.   6. The aortic valve is tricuspid. There is mild calcification of the  aortic valve.   7. The inferior vena cava is normal in size with <50% respiratory  variability, suggesting right atrial pressure of 8 mmHg.   Assessment and Plan:  1.  CAD with recent follow-up cardiac catheterization showing 40% proximal to mid RCA stenosis and 60% PDA stenosis.  Plan medical therapy and observation in the absence of angina.  He is on aspirin  81 mg daily.   2.  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  He was evaluated in the heart failure clinic with plan for medical therapy.  Currently on Farxiga  10 mg daily and Demadex  20 mg twice daily with additional dose as needed.  Discussed possibility of adding Kerendia next.  Continue to track weight at home.  3.  Persistent atrial fibrillation with CHA2DS2-VASc score of 5 status post Watchman implantation by Dr. Wonda.  No longer on antiarrhythmic therapy.  Heart rate controlled in the absence of AV nodal blockers.   4.  Primary hypertension.  Blood pressure is well-controlled today.   5.  Mixed hyperlipidemia.  He has a history of statin intolerance, stopped Lopid  as well with concerns about contribution to leg fatigue.  We will try Zetia 10 mg daily.  7.  OSA on CPAP.  He reports compliance with treatment.  8.  CKD stage IIIb, creatinine 1.07 and GFR 70 as of August.  9.  Leg fatigue, likely multifactorial, possible component of neuropathy.  May benefit from PT assessment.  We will check a TSH, T4 and T3 as well.  Disposition:  Follow up 3 months.  Signed, Jayson JUDITHANN Sierras, M.D., F.A.C.C. [facility]

## 2024-08-13 ENCOUNTER — Encounter (INDEPENDENT_AMBULATORY_CARE_PROVIDER_SITE_OTHER): Payer: Self-pay | Admitting: Gastroenterology

## 2024-08-13 ENCOUNTER — Other Ambulatory Visit (HOSPITAL_COMMUNITY)
Admission: RE | Admit: 2024-08-13 | Discharge: 2024-08-13 | Disposition: A | Discharge status: Home / Self Care | Source: Ambulatory Visit | Attending: Cardiology | Admitting: Cardiology

## 2024-08-13 DIAGNOSIS — R5383 Other fatigue: Secondary | ICD-10-CM | POA: Diagnosis present

## 2024-08-13 LAB — T4, FREE: Free T4: 0.82 ng/dL (ref 0.61–1.12)

## 2024-08-13 LAB — TSH: TSH: 3.48 u[IU]/mL (ref 0.350–4.500)

## 2024-08-14 ENCOUNTER — Ambulatory Visit: Payer: Self-pay | Admitting: Cardiology

## 2024-08-14 LAB — T3, FREE: T3, Free: 2.6 pg/mL (ref 2.0–4.4)

## 2024-08-20 ENCOUNTER — Encounter: Payer: Self-pay | Admitting: Podiatry

## 2024-08-20 ENCOUNTER — Ambulatory Visit (INDEPENDENT_AMBULATORY_CARE_PROVIDER_SITE_OTHER): Admitting: Podiatry

## 2024-08-20 DIAGNOSIS — B351 Tinea unguium: Secondary | ICD-10-CM | POA: Diagnosis not present

## 2024-08-20 DIAGNOSIS — M79674 Pain in right toe(s): Secondary | ICD-10-CM | POA: Diagnosis not present

## 2024-08-20 DIAGNOSIS — E1169 Type 2 diabetes mellitus with other specified complication: Secondary | ICD-10-CM | POA: Diagnosis not present

## 2024-08-20 DIAGNOSIS — M79675 Pain in left toe(s): Secondary | ICD-10-CM

## 2024-08-20 DIAGNOSIS — Z794 Long term (current) use of insulin: Secondary | ICD-10-CM

## 2024-08-20 NOTE — Progress Notes (Signed)
 This patient returns to my office for at risk foot care.  This patient requires this care by a professional since this patient will be at risk due to having type 2 diabetes, CKD and coagulation defect.  This patient is unable to cut nails himself since the patient cannot reach his nails.These nails are painful walking and wearing shoes.  This patient presents for at risk foot care today.  General Appearance  Alert, conversant and in no acute stress.  Vascular  Dorsalis pedis and posterior tibial  pulses are palpable  bilaterally.  Capillary return is within normal limits  bilaterally. Temperature is within normal limits  bilaterally.  Neurologic  Senn-Weinstein monofilament wire test within normal limits  bilaterally. Muscle power within normal limits bilaterally.  Nails Thick disfigured discolored nails with subungual debris  from hallux to fifth toes bilaterally. No evidence of bacterial infection or drainage bilaterally.  Orthopedic  No limitations of motion  feet .  No crepitus or effusions noted.  No bony pathology or digital deformities noted.  Skin  normotropic skin with no porokeratosis noted bilaterally.  No signs of infections or ulcers noted.     Onychomycosis  Pain in right toes  Pain in left toes  Consent was obtained for treatment procedures.   Mechanical debridement of nails 1-5  bilaterally performed with a nail nipper.  Filed with dremel without incident.    Return office visit    3 months                  Told patient to return for periodic foot care and evaluation due to potential at risk complications.   Cordella Bold DPM  tomma

## 2024-09-01 NOTE — Progress Notes (Signed)
 [facility] - Clinic Note  09/04/2024    CHIEF COMPLAINT Patient presents for Retina Follow Up  HISTORY OF PRESENT ILLNESS: Lucas Dudley is a 83 y.o. male who presents to the clinic today for:  HPI     Retina Follow Up   Patient presents with  Diabetic Retinopathy.  In both eyes.  This started 6 weeks ago.        Comments   Patient here for 6 weeks retina follow up for NPDR OU. Patient states vision about the same. Had laser OD this past week and OS later before end of year. No eye pain.       Last edited by [name], COA on 09/04/2024  1:35 PM.     Pt states he had a laser procedure w/ Dr. Fleeta this past week.    Referring physician: [name], MD [address] [place],  [place] [address]  HISTORICAL INFORMATION:   Selected notes from the MEDICAL RECORD NUMBER Referred by Dr. Lelon for DEE   CURRENT MEDICATIONS: Current Outpatient Medications (Ophthalmic Drugs)  Medication Sig   COMBIGAN  0.2-0.5 % ophthalmic solution Place 1 drop into both eyes 2 (two) times daily.   dorzolamide  (TRUSOPT ) 2 % ophthalmic solution 1 drop 2 (two) times daily.   No current facility-administered medications for this visit. (Ophthalmic Drugs)   Current Outpatient Medications (Other)  Medication Sig   albuterol  (VENTOLIN  HFA) 108 (90 Base) MCG/ACT inhaler Inhale 2 puffs into the lungs every 6 (six) hours as needed for wheezing or shortness of breath.   ammonium lactate  (AMLACTIN DAILY) 12 % lotion Apply 1 Application topically as needed.   aspirin  81 MG chewable tablet Chew 81 mg by mouth daily.   camphor-menthol (ANTI-ITCH) lotion Apply 1 Application topically daily as needed for itching.   colchicine  0.6 MG tablet TAKE 2 TABLETS ON DAY 1, FOLLOWED BY 1 TABLET 1 HOUR LATER. PLEASE TAKE 1 TABLET ONCE DAILY FROM DAY 2 TILL SWELLING IMPROVES.   dapagliflozin  propanediol (FARXIGA ) 10 MG TABS tablet Take 1 tablet (10 mg total) by mouth daily.    diphenhydramine -acetaminophen  (TYLENOL  PM) 25-500 MG TABS tablet Take 1 tablet by mouth at bedtime.   ELDERBERRY PO Take 2 tablets by mouth daily. Chewable   ezetimibe  (ZETIA ) 10 MG tablet Take 1 tablet (10 mg total) by mouth daily.   ferrous sulfate  325 (65 FE) MG tablet Take 1 tablet (325 mg total) by mouth daily with breakfast.   fluticasone  (FLONASE ) 50 MCG/ACT nasal spray Place 1 spray into both nostrils daily. SPRAY 2 SPRAYS INTO EACH NOSTRIL EVERY DAY   gabapentin  (NEURONTIN ) 300 MG capsule Take 1 capsule (300 mg total) by mouth 3 (three) times daily.   gemfibrozil  (LOPID ) 600 MG tablet Take 600 mg by mouth at bedtime.    glimepiride  (AMARYL ) 2 MG tablet Take 1 tablet (2 mg total) by mouth daily with breakfast.   Insulin  Glargine (BASAGLAR  KWIKPEN) 100 UNIT/ML INJECT 45 UNITS INTO THE SKIN AT BEDTIME.   magnesium  gluconate (MAGONATE) 500 MG tablet Take 500 mg by mouth daily.   mineral oil liquid Take 15 mLs by mouth daily as needed for moderate constipation.   Omega-3 Fatty Acids (FISH OIL EXTRA STRENGTH) 1200 MG CAPS Take 1 capsule by mouth daily.   OVER THE COUNTER MEDICATION Take 1 tablet by mouth daily. Beets Chew   pantoprazole  (PROTONIX ) 40 MG tablet TAKE 1 TABLET BY MOUTH EVERY DAY   vitamin E 45  MG (100 UNITS) capsule Take 100 Units by mouth daily.   zinc  gluconate 50 MG tablet Take 50 mg by mouth daily.   methylPREDNISolone  (MEDROL  DOSEPAK) 4 MG TBPK tablet Take as package instructions.   torsemide  (DEMADEX ) 20 MG tablet 40 MG IN THE MORNING AND 20 MG IN THE EVENING (Patient taking differently: Take 20 mg by mouth 2 (two) times daily. May take an extra 2 tabs as needed in the evening)   No current facility-administered medications for this visit. (Other)   REVIEW OF SYSTEMS: ROS   Positive for: Gastrointestinal, Endocrine, Cardiovascular, Eyes, Respiratory Negative for: Constitutional, Neurological, Skin, Genitourinary, Musculoskeletal, HENT, Psychiatric, Allergic/Imm,  Heme/Lymph Last edited by [name], COA on 09/04/2024  1:35 PM.        ALLERGIES Allergies  Allergen Reactions   Silicone Dermatitis   Other Dermatitis and Other (See Comments)    Adhesive agent (substance)   Azithromycin  Nausea Only   Tape Rash and Other (See Comments)    Causes skin redness, Use paper tape only.   PAST MEDICAL HISTORY Past Medical History:  Diagnosis Date   Arthritis    Atrial fibrillation (HCC)    CAD (coronary artery disease)    a. Cath 03/17/15 showing 100% ostial D1, 50% prox LAD to mid LAD, 40% RPDA stenosis. Med rx. // Myoview  01/2020: EF 31 diffuse perfusion defect without reversibility (suspect artifact); reviewed with Dr. Suellen study felt to be low risk   Cataract    Mixed form OD   Chronic diastolic CHF (congestive heart failure) (HCC)    Chronically elevated hemidiaphragm - Right Side    Diabetic retinopathy (HCC)    NPDR OU   Essential hypertension    Glaucoma    POAG OU   History of gout    Hyperlipidemia    Hypertensive retinopathy    OU   Kidney stones    Melanoma of neck (HCC)    NICM (nonischemic cardiomyopathy) (HCC)    OSA on CPAP 2012   Prostate cancer (HCC)    Type II diabetes mellitus (HCC)    Past Surgical History:  Procedure Laterality Date   ABDOMINAL HERNIA REPAIR     w/mesh   ATRIAL FIBRILLATION ABLATION N/A 06/06/2021   Procedure: ATRIAL FIBRILLATION ABLATION;  Surgeon: Kelsie Agent, MD;  Location: [facility]AS[facility] CV [facility];  Service: Cardiovascular;  Laterality: N/A;   BIOPSY  05/24/2022   Procedure: BIOPSY;  Surgeon: Shaaron Lamar HERO, MD;  Location: [facility];  Service: Endoscopy;;   BIOPSY  05/30/2022   Procedure: BIOPSY;  Surgeon: Shaaron Lamar HERO, MD;  [facility]: [facility];  Service: Endoscopy;;   BIOPSY  01/10/2023   Procedure: BIOPSY;  Surgeon: Wilhelmenia Aloha Raddle., MD;  Location: [facility];  Service: Gastroenterology;;   CARDIAC CATHETERIZATION N/A 03/17/2015   Procedure: Left Heart Cath and  Coronary Angiography;  Surgeon: Dorn JINNY Lesches, MD;  Location: [facility] INVASIVE CV LAB;  Service: Cardiovascular;  Laterality: N/A;   CARDIOVERSION N/A 08/09/2021   Procedure: CARDIOVERSION;  Surgeon: Raford Riggs, MD;  Location: [facility] ENDOSCOPY;  Service: Cardiovascular;  Laterality: N/A;   CARDIOVERSION N/A 11/10/2021   Procedure: CARDIOVERSION;  Surgeon: Sheena Pugh, DO;  Location: [facility];  Service: Cardiovascular;  Laterality: N/A;   carotid doppler  09/17/2008   rigt and left ICAs 0-49%;mildly  abnormal   CATARACT EXTRACTION Left 2020   Dr. Lelon   COLONOSCOPY N/A 05/16/2017   Procedure: COLONOSCOPY;  Surgeon: Golda Claudis PENNER, MD;  [facility];  Service: Endoscopy;  Laterality: N/A;  930   COLONOSCOPY WITH PROPOFOL  N/A 05/24/2022   Procedure: COLONOSCOPY WITH PROPOFOL ;  Surgeon: Shaaron Lamar HERO, MD;  [facility]: [facility];  Service: Endoscopy;  Laterality: N/A;   DOPPLER ECHOCARDIOGRAPHY  05/25/2009   EF 50-55%,LA mildly dilated, LV function normal   ELECTROPHYSIOLOGIC STUDY N/A 04/05/2015   Procedure: Cardioversion;  Surgeon: Jerel Balding, MD;  Location: [facility];  Service: Cardiovascular;  Laterality: N/A;   ELECTROPHYSIOLOGIC STUDY N/A 09/06/2015   Procedure: Atrial Fibrillation Ablation;  Surgeon: [name], MD;  Location[facility] INVASIVE CV LAB;  Service: Cardiovascular;  Laterality: N/A;   ELECTROPHYSIOLOGIC STUDY N/A 07/12/2016   redo afib ablation by Dr Rakers   ENTEROSCOPY N/A 05/30/2022   Procedure: ENTEROSCOPY;  Surgeon: Shaaron Lamar HERO, MD;  [facility];  Service: Endoscopy;  Laterality: N/A;   ENTEROSCOPY N/A 01/07/2024   Procedure: ENTEROSCOPY;  Surgeon: Eartha Angelia Sieving, MD;  [facility]: [facility];  Service: Gastroenterology;  Laterality: N/A;  1:45PM;ASA 3   ESOPHAGOGASTRODUODENOSCOPY (EGD) WITH PROPOFOL  N/A 05/24/2022   Procedure: ESOPHAGOGASTRODUODENOSCOPY (EGD) WITH PROPOFOL ;  Surgeon: Shaaron Lamar HERO, MD;  [facility];  Service: Endoscopy;  Laterality: N/A;   ESOPHAGOGASTRODUODENOSCOPY (EGD) WITH PROPOFOL  N/A 01/10/2023   Procedure: ESOPHAGOGASTRODUODENOSCOPY (EGD) WITH PROPOFOL ;  Surgeon: Wilhelmenia Aloha Raddle., MD;  Location: [facility];  Service: Gastroenterology;  Laterality: N/A;   EUS N/A 01/10/2023   Procedure: UPPER ENDOSCOPIC ULTRASOUND (EUS) RADIAL;  Surgeon: Wilhelmenia Aloha Raddle., MD;  Location: [facility];  Service: Gastroenterology;  Laterality: N/A;   EXCISIONAL HEMORRHOIDECTOMY     inside and out   EYE SURGERY Left 2020   Cat Sx - Dr. Lelon   FINE NEEDLE ASPIRATION Right    knee; drew ~ 1 quart off   GIVENS CAPSULE STUDY N/A 05/25/2022   Procedure: GIVENS CAPSULE STUDY;  Surgeon: Cindie Carlin POUR, DO;  [facility]: [facility];  Service: Endoscopy;  Laterality: N/A;   GIVENS CAPSULE STUDY N/A 01/23/2023   Procedure: GIVENS CAPSULE STUDY;  Surgeon: Eartha Angelia Sieving, MD;  [facility]: [facility];  Service: Gastroenterology;  Laterality: N/A;  8:30am   HERNIA REPAIR     HOT HEMOSTASIS N/A 01/10/2023   Procedure: HOT HEMOSTASIS (ARGON PLASMA COAGULATION/BICAP);  Surgeon: Wilhelmenia Aloha Raddle., MD;  Location: [facility];  Service: Gastroenterology;  Laterality: N/A;   HOT HEMOSTASIS  01/07/2024   Procedure: EGD, WITH ARGON PLASMA COAGULATION;  Surgeon: Eartha Angelia Sieving, MD;  [facility];  Service: Gastroenterology;;   LAPAROSCOPIC CHOLECYSTECTOMY     LEFT ATRIAL APPENDAGE OCCLUSION N/A 06/28/2022   Procedure: LEFT ATRIAL APPENDAGE OCCLUSION;  Surgeon: Wonda Sharper, MD;  Location: [facility] INVASIVE CV LAB;  Service: Cardiovascular;  Laterality: N/A;   MELANOMA EXCISION Right    neck   NM MYOCAR PERF WALL MOTION  02/21/2012   EF 61% ,EXERCISE 7 METS. exercise stopped due to wheezing and shortness of breathe   POLYPECTOMY  05/16/2017   Procedure: POLYPECTOMY;  Surgeon: Golda Claudis PENNER, MD;  [facility];  Service: Endoscopy;;  colon    POLYPECTOMY  05/24/2022   Procedure: POLYPECTOMY;  Surgeon: Shaaron Lamar HERO, MD;  [facility];  Service: Endoscopy;;   POLYPECTOMY  01/10/2023   Procedure: POLYPECTOMY;  Surgeon: Wilhelmenia Aloha Raddle., MD;  Location: [facility];  Service: Gastroenterology;;   PROSTATECTOMY     RIGHT/LEFT HEART CATH AND CORONARY ANGIOGRAPHY N/A 01/27/2024   Procedure: RIGHT/LEFT HEART  CATH AND CORONARY ANGIOGRAPHY;  Surgeon: Zenaida Morene PARAS, MD;  Location: [facility] INVASIVE CV LAB;  Service: Cardiovascular;  Laterality: N/A;   SHOULDER OPEN ROTATOR CUFF REPAIR Right X 2   SUBMUCOSAL TATTOO INJECTION  05/30/2022   Procedure: SUBMUCOSAL TATTOO INJECTION;  Surgeon: Shaaron Lamar HERO, MD;  [facility];  Service: Endoscopy;;   SUBMUCOSAL TATTOO INJECTION  01/10/2023   Procedure: SUBMUCOSAL TATTOO INJECTION;  Surgeon: Wilhelmenia Aloha Raddle., MD;  Location: [facility];  Service: Gastroenterology;;   TEE WITHOUT CARDIOVERSION N/A 09/05/2015   Procedure: TRANSESOPHAGEAL ECHOCARDIOGRAM (TEE);  Surgeon: Wilbert JONELLE Bihari, MD;  Location: [facility] ENDOSCOPY;  Service: Cardiovascular;  Laterality: N/A;   TEE WITHOUT CARDIOVERSION N/A 06/28/2022   Procedure: TRANSESOPHAGEAL ECHOCARDIOGRAM (TEE);  Surgeon: Wonda Sharper, MD;  Location: [facility] INVASIVE CV LAB;  Service: Cardiovascular;  Laterality: N/A;   FAMILY HISTORY Family History  Problem Relation Age of Onset   Heart disease Mother    Lung cancer Mother    Heart attack Mother 5   Diabetes Father    Heart disease Father    Stroke Brother    Healthy Daughter    Colon cancer Maternal Aunt        84s   SOCIAL HISTORY Social History   Tobacco Use   Smoking status: Former    Current packs/day: 0.00    Average packs/day: 2.0 packs/day for 27.0 years (54.0 ttl pk-yrs)    Types: Cigarettes    Start date: 55    Quit date: 1992    Years since quitting: 33.9    Passive exposure: Current   Smokeless tobacco: Never   Tobacco comments:    Former smoker  09/27/21  Vaping Use   Vaping status: Never Used  Substance Use Topics   Alcohol use: No    Alcohol/week: 0.0 standard drinks of alcohol    Comment: used to drink; stopped ~ 2008   Drug use: No       OPHTHALMIC EXAM: Base Eye Exam     Visual Acuity (Snellen - Linear)       Right Left   Dist cc 20/20 -2 20/20    Correction: Glasses         Tonometry (Tonopen, 1:33 PM)       Right Left   Pressure 19 18         Pupils       Dark Light Shape React APD   Right 2 1 Round Brisk None   Left 2 1 Round Brisk None         Visual Fields (Counting fingers)       Left Right    Full Full         Extraocular Movement       Right Left    Full, Ortho Full, Ortho         Neuro/Psych     Oriented x3: Yes   Mood/Affect: Normal         Dilation     Both eyes: 1.0% Mydriacyl, 2.5% Phenylephrine  @ 1:33 PM           Slit Lamp and Fundus Exam     Slit Lamp Exam       Right Left   Lids/Lashes Dermato, mild MGD Dermato, mild MGD   Conjunctiva/Sclera Temporal pinguecula, mild inferior sub conj heme Temporal pinguecula   Cornea EBMD, mild haze, trace PEE, mild Debris in tear film, well healed cataract wound trace haze, trace PEE, well healed temporal cataract  wounds, arcus   Anterior Chamber Deep and clear; narrow temporal angle Deep and clear   Iris Round and dilated, mild anterior bowing, No NVI Round and moderately dilated to 5.33mm   Lens PCIOL in good position, trace PCO PC IOL in good position with open PC   Anterior Vitreous Synerisis Synerisis         Fundus Exam       Right Left   Disc Mild pallor, sharp rim, +cupping, thin inferior rim Mild pallor, sharp rim, +cupping, +PPA   C/D Ratio 0.7 0.7   Macula Flat, good foveal reflex, trace cystic changes - persistent, RPE mottling and clumping, no heme, Drusen, no exudates Flat, blunted foveal reflex, trace cystic changes and punctate MA-stably improved   Vessels attenuated, Tortuous attenuated,  Tortuous   Periphery Attached, rare MA, focal DBH nasal to disc--improved Attached. No heme.           Refraction     Wearing Rx       Sphere Cylinder Axis Add   Right -0.25 +2.00 158 +2.50   Left -1.00 +1.25 008 +2.50           IMAGING AND PROCEDURES  Imaging and Procedures for 09/04/2024  OCT, Retina - OU - Both Eyes       Right Eye Quality was good. Central Foveal Thickness: 269. Progression has improved. Findings include normal foveal contour, no SRF, intraretinal fluid, vitreomacular adhesion (Interval resolution of trace cystic changes ST fovea, partial PVD).   Left Eye Quality was good. Central Foveal Thickness: 269. Progression has been stable. Findings include normal foveal contour, no IRF, no SRF, intraretinal hyper-reflective material (persistent cystic changes and punctate IRHM, partial PVD).   Notes *Images captured and stored on drive  Diagnosis / Impression:  +DME OU OD: Interval resolution of trace cystic changes ST fovea,  partial PVD OS: persistent cystic changes and punctate IRHM, partial PVD  Clinical management:  See below  Abbreviations: NFP - Normal foveal profile. CME - cystoid macular edema. PED - pigment epithelial detachment. IRF - intraretinal fluid. SRF - subretinal fluid. EZ - ellipsoid zone. ERM - epiretinal membrane. ORA - outer retinal atrophy. ORT - outer retinal tubulation. SRHM - subretinal hyper-reflective material. IRHM - intraretinal hyper-reflective material     Intravitreal Injection, Pharmacologic Agent - OD - Right Eye       Time Out 09/04/2024. 2:37 PM. Confirmed correct patient, procedure, site, and patient consented.   Anesthesia Topical anesthesia was used. Anesthetic medications included Lidocaine  2%, Proparacaine 0.5%.   Procedure Preparation included 5% betadine to ocular surface, eyelid speculum. A supplied needle was used.   Injection: 6 mg faricimab -svoa 6 MG/0.05ML (Patient supplied)   Route:  Intravitreal, Site: Right Eye   NDC: [phone], Waste: 0 mL   Post-op Post injection exam found visual acuity of at least counting fingers. The patient tolerated the procedure well. There were no complications. The patient received written and verbal post procedure care education. Post injection medications were not given.   Notes **PAP MEDICATION ADMINISTERED**      Intravitreal Injection, Pharmacologic Agent - OS - Left Eye       Time Out 09/04/2024. 2:37 PM. Confirmed correct patient, procedure, site, and patient consented.   Anesthesia Topical anesthesia was used. Anesthetic medications included Lidocaine  2%, Proparacaine 0.5%.   Procedure Preparation included 5% betadine to ocular surface, eyelid speculum. A supplied (32g) needle was used.   Injection: 6 mg faricimab -svoa 6 MG/0.05ML (Patient supplied)   Route:  Intravitreal, Site: Left Eye   NDC: [phone], Waste: 0 mL   Post-op Post injection exam found visual acuity of at least counting fingers. The patient tolerated the procedure well. There were no complications. The patient received written and verbal post procedure care education. Post injection medications were not given.   Notes **PAP MEDICATION ADMINISTERED**            ASSESSMENT/PLAN:   ICD-10-CM   1. 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, associated with type 2 diabetes mellitus (HCC)  E11.3213 OCT, Retina - OU - Both Eyes    Intravitreal Injection, Pharmacologic Agent - OD - Right Eye    Intravitreal Injection, Pharmacologic Agent - OS - Left Eye    2. Current use of insulin  (HCC)  Z79.4     3. Long term (current) use of oral hypoglycemic drugs  Z79.84     4. Essential hypertension  I10     5. Hypertensive retinopathy of both eyes  H35.033     6. Pseudophakia of both eyes  Z96.1     7. 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 History of herpes zoster of eye  Z86.19      9. Primary open angle glaucoma of both eyes, unspecified glaucoma stage  H40.1130       1-3. 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 Single focal MA superior to fovea; OS: Perifoveal Mas w/ late leakage - s/p IVA OS # 1(07.26.21), #2 (08.24.21), #3 (09.21.21), #4 (10.22.21) -- IVA resistance ============================= - s/p IVE OD #1 (09.29.23), #2 (10.30.23), #3 (SAMPLE) (11.27.23) #4 (12.29.23) #5 (01.26.24) #6 (02.23.24) -- IVE resistance - s/p IVE OS #1 (11.22.21), #2 (12.21.21), #3 (01.20.22), #4 (02.18.22), #5 (03.18.22), #6 (04.15.22), #7 (05.10.22) -- sample, #8 (06.08.22), #9 (07.12.22), #10 (08.12.22), #11 (09.16.22), #12 (10.21.22), #13 (11.18.22), #14 (12.21.22), #15 (01.20.23), #16 (02.17.23), #17 (03.24.23), #18 (04.21.23), #19 (05.26.23), #20 (06.23.23) -- IVE resistance ============================ - s/p IVV OD #1 (03.22.24), #2 (04.19.24), #3 (05.17.24), #4 (06.18.24), #5 (07.19.24), #6 (08.23.24), #7 (09.27.24), #8 (11.01.24), #9 (12.06.24), #10 (01.10.25 -- sample), #11 (02.14.25 -- sample) #12 (03.21.25 -- sample), #13 (04.25.25 -- sample) #14(05.30.25), #15 (07.11.25--PAP) #16(08.26.25) sample #17(10.10.25) - s/p IVV OS #1 (07.21.23), #2 (08.25.23), #3 (09.29.23), #4 (10.30.23), #5 (11.27.23) #6 (12.29.23), #7 (01.26.23), #8 (02.23.24), #9 (03.22.24), #10 (04.19.24), #11 (05.17.24), #12 (12.06.24), #13 (02.14.25 -- sample), #14 (03.21.25 -- sample),  #15 (04.25.25 -- sample), #16 (05.30.25 -- sample), #17 (07.11.25--PAP) #18(08.26.25) sample #19(10.10.2025) PAP ============================ - BCVA OD: 20/20 -- stable; OS improved to 20/20 from 20/30 - OCT shows OD: Interval resolution of trace cystic changes ST fovea,  partial PVD; OS: persistent cystic changes and punctate IRHM, partial PVD at 6 weeks - recommend IVV OD #18 and IVV OS #20 (PAP MEDS) today, 11.21.25 with f/u ext to 6 weeks [Good Days funding unavailable)  - pt  wishes to proceed with injections OU  - RBA of procedure discussed, questions answered  - Vabysmo  informed consent obtained and signed, 02.14.25 (OU) - [employer] approved Vabysmo , no good days for 2025 as of now - see procedure note - pt has PAP medication - f/u 6 weeks -- DFE/OCT, possible injection(s)  4,5. Hypertensive retinopathy OU - discussed importance of tight BP control - continue to monitor  6. Pseudophakia OU  - s/p CE/IOL OS (2020, Dr. Lelon)             -  s/p CE/IOL OD (2022, Dr. Lelon)  - s/p YAG cap OS (04.04.23)  - s/p YAG cap OD (11.17.25, Dr. Fleeta) - BCVA 20/30 OD and 20/25 OS from 20/150  - IOLs in good position, doing well  - continue to monitor  7. 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 Caresse in August 2022  - s/p SuperK OS on 12.12.2022  8. History of herpes zoster/iritis OS  - on valtrex  1g daily--maintenance  9. POAG OU  - was under the expert management of Dr. Lelon, now follows with Dr. Fleeta  - IOP 19,18  - continue combigan  bid OU  - Dr. Fleeta added orange top gtt  Ophthalmic Meds Ordered this visit:  No orders of the defined types were placed in this encounter.    This document serves as a record of services personally performed by Redell JUDITHANN Hans, MD, PhD. It was created on their behalf by Delon Newness COT, an ophthalmic technician. The creation of this record is the provider's dictation and/or activities during the visit.    Electronically signed by: Delon Newness COT 11.18.2025 2:39 PM  This document serves as a record of services personally performed by Redell JUDITHANN Hans, MD, PhD. It was created on their behalf by Almetta Pesa, an ophthalmic technician. The creation of this record is the provider's dictation and/or activities during the visit.    Electronically signed by: Almetta Pesa, OA, 09/04/24  2:45 PM   Abbreviations: M myopia (nearsighted); A astigmatism; H hyperopia (farsighted); P presbyopia; Mrx spectacle  prescription;  CTL contact lenses; OD right eye; OS left eye; OU both eyes  XT exotropia; ET esotropia; PEK punctate epithelial keratitis; PEE punctate epithelial erosions; DES dry eye syndrome; MGD meibomian gland dysfunction; ATs artificial tears; PFAT's preservative free artificial tears; NSC nuclear sclerotic cataract; PSC posterior subcapsular cataract; ERM epi-retinal membrane; PVD posterior vitreous detachment; RD retinal detachment; DM diabetes mellitus; DR diabetic retinopathy; NPDR non-proliferative diabetic retinopathy; PDR proliferative diabetic retinopathy; CSME clinically significant macular edema; DME diabetic macular edema; dbh dot blot hemorrhages; CWS cotton wool spot; POAG primary open angle glaucoma; C/D cup-to-disc ratio; HVF humphrey visual field; GVF goldmann visual field; OCT optical coherence tomography; IOP intraocular pressure; BRVO Branch retinal vein occlusion; CRVO central retinal vein occlusion; CRAO central retinal artery occlusion; BRAO branch retinal artery occlusion; RT retinal tear; SB scleral buckle; PPV pars plana vitrectomy; VH Vitreous hemorrhage; PRP panretinal laser photocoagulation; IVK intravitreal kenalog; VMT vitreomacular traction; MH Macular hole;  NVD neovascularization of the disc; NVE neovascularization elsewhere; AREDS age related eye disease study; ARMD age related macular degeneration; POAG primary open angle glaucoma; EBMD epithelial/anterior basement membrane dystrophy; ACIOL anterior chamber intraocular lens; IOL intraocular lens; PCIOL posterior chamber intraocular lens; Phaco/IOL phacoemulsification with intraocular lens placement; PRK photorefractive keratectomy; LASIK laser assisted in situ keratomileusis; HTN hypertension; DM diabetes mellitus; COPD chronic obstructive pulmonary disease

## 2024-09-02 ENCOUNTER — Ambulatory Visit (INDEPENDENT_AMBULATORY_CARE_PROVIDER_SITE_OTHER): Admitting: Podiatry

## 2024-09-02 DIAGNOSIS — G629 Polyneuropathy, unspecified: Secondary | ICD-10-CM

## 2024-09-02 DIAGNOSIS — M7752 Other enthesopathy of left foot: Secondary | ICD-10-CM | POA: Diagnosis not present

## 2024-09-02 MED ORDER — GABAPENTIN 300 MG PO CAPS: 300.0000 mg | ORAL_CAPSULE | Freq: Three times a day (TID) | ORAL | 2 refills | Status: DC

## 2024-09-02 NOTE — Progress Notes (Signed)
 Subjective:  Patient ID: Lucas Dudley, male    DOB: 09-12-1941,  MRN: [id number]  Chief Complaint  Patient presents with   Foot Pain    Pt stated that he has been having some discomfort with his left ankle no recent injuries     83 y.o. male presents with the above complaint.  Patient presents with left ankle pain that has been going for quite some time is progressive gotten worse worse with ambulation and shoe pressure causing some discomfort.  No recent injury.  He also is experiencing some neuropathy pain.  He would like to try gabapentin for pain scale 7 out of 10 dull aching nature hurts with ambulation worse with pressure   Review of Systems: Negative except as noted in the HPI. Denies N/V/F/Ch.  Past Medical History:  Diagnosis Date   Arthritis    Atrial fibrillation (HCC)    CAD (coronary artery disease)    a. Cath 03/17/15 showing 100% ostial D1, 50% prox LAD to mid LAD, 40% RPDA stenosis. Med rx. // Myoview  01/2020: EF 31 diffuse perfusion defect without reversibility (suspect artifact); reviewed with Dr. Suellen study felt to be low risk   Cataract    Mixed form OD   Chronic diastolic CHF (congestive heart failure) (HCC)    Chronically elevated hemidiaphragm - Right Side    Diabetic retinopathy (HCC)    NPDR OU   Essential hypertension    Glaucoma    POAG OU   History of gout    Hyperlipidemia    Hypertensive retinopathy    OU   Kidney stones    Melanoma of neck (HCC)    NICM (nonischemic cardiomyopathy) (HCC)    OSA on CPAP 2012   Prostate cancer (HCC)    Type II diabetes mellitus (HCC)     Current Outpatient Medications:    gabapentin (NEURONTIN) 300 MG capsule, Take 1 capsule (300 mg total) by mouth 3 (three) times daily., Disp: 90 capsule, Rfl: 2   albuterol  (VENTOLIN  HFA) 108 (90 Base) MCG/ACT inhaler, Inhale 2 puffs into the lungs every 6 (six) hours as needed for wheezing or shortness of breath., Disp: 18 g, Rfl: 3   ammonium lactate  (AMLACTIN DAILY)  12 % lotion, Apply 1 Application topically as needed., Disp: 400 g, Rfl: 0   aspirin  81 MG chewable tablet, Chew 81 mg by mouth daily., Disp: , Rfl:    camphor-menthol (ANTI-ITCH) lotion, Apply 1 Application topically daily as needed for itching., Disp: , Rfl:    colchicine  0.6 MG tablet, TAKE 2 TABLETS ON DAY 1, FOLLOWED BY 1 TABLET 1 HOUR LATER. PLEASE TAKE 1 TABLET ONCE DAILY FROM DAY 2 TILL SWELLING IMPROVES., Disp: 60 tablet, Rfl: 1   COMBIGAN  0.2-0.5 % ophthalmic solution, Place 1 drop into both eyes 2 (two) times daily., Disp: , Rfl:    dapagliflozin  propanediol (FARXIGA ) 10 MG TABS tablet, Take 1 tablet (10 mg total) by mouth daily., Disp: 90 tablet, Rfl: 1   diphenhydramine -acetaminophen  (TYLENOL  PM) 25-500 MG TABS tablet, Take 1 tablet by mouth at bedtime., Disp: , Rfl:    dorzolamide  (TRUSOPT ) 2 % ophthalmic solution, 1 drop 2 (two) times daily., Disp: , Rfl:    ELDERBERRY PO, Take 2 tablets by mouth daily. Chewable, Disp: , Rfl:    ezetimibe (ZETIA) 10 MG tablet, Take 1 tablet (10 mg total) by mouth daily., Disp: 90 tablet, Rfl: 3   ferrous sulfate  325 (65 FE) MG tablet, Take 1 tablet (325 mg total) by mouth  daily with breakfast., Disp: 120 tablet, Rfl: 1   fluticasone  (FLONASE ) 50 MCG/ACT nasal spray, Place 1 spray into both nostrils daily. SPRAY 2 SPRAYS INTO EACH NOSTRIL EVERY DAY, Disp: 48 mL, Rfl: 1   gemfibrozil  (LOPID ) 600 MG tablet, Take 600 mg by mouth at bedtime. , Disp: , Rfl:    glimepiride  (AMARYL ) 2 MG tablet, Take 1 tablet (2 mg total) by mouth daily with breakfast. (Patient taking differently: Take 2 mg by mouth 2 (two) times daily.), Disp: 30 tablet, Rfl: 0   Insulin  Glargine (BASAGLAR  KWIKPEN) 100 UNIT/ML, INJECT 45 UNITS INTO THE SKIN AT BEDTIME., Disp: 45 mL, Rfl: 1   magnesium  gluconate (MAGONATE) 500 MG tablet, Take 500 mg by mouth daily., Disp: , Rfl:    methylPREDNISolone  (MEDROL  DOSEPAK) 4 MG TBPK tablet, Take as package instructions., Disp: 1 each, Rfl: 0    mineral oil liquid, Take 15 mLs by mouth daily as needed for moderate constipation., Disp: , Rfl:    Omega-3 Fatty Acids (FISH OIL EXTRA STRENGTH) 1200 MG CAPS, Take 1 capsule by mouth daily., Disp: , Rfl:    OVER THE COUNTER MEDICATION, Take 1 tablet by mouth daily. Beets Chew, Disp: , Rfl:    pantoprazole  (PROTONIX ) 40 MG tablet, TAKE 1 TABLET BY MOUTH EVERY DAY, Disp: 90 tablet, Rfl: 1   torsemide  (DEMADEX ) 20 MG tablet, 40 MG IN THE MORNING AND 20 MG IN THE EVENING (Patient taking differently: Take 20 mg by mouth 2 (two) times daily. May take an extra 2 tabs as needed in the evening), Disp: 270 tablet, Rfl: 1   vitamin E 45 MG (100 UNITS) capsule, Take 100 Units by mouth daily., Disp: , Rfl:    zinc  gluconate 50 MG tablet, Take 50 mg by mouth daily., Disp: , Rfl:   Social History   Tobacco Use  Smoking Status Former   Current packs/day: 0.00   Average packs/day: 2.0 packs/day for 27.0 years (54.0 ttl pk-yrs)   Types: Cigarettes   Start date: 53   Quit date: 13   Years since quitting: 33.9   Passive exposure: Current  Smokeless Tobacco Never  Tobacco Comments   Former smoker 09/27/21    Allergies  Allergen Reactions   Silicone Dermatitis   Other Dermatitis and Other (See Comments)    Adhesive agent (substance)   Azithromycin  Nausea Only   Tape Rash and Other (See Comments)    Causes skin redness, Use paper tape only.   Objective:  There were no vitals filed for this visit. There is no height or weight on file to calculate BMI. Constitutional Well developed. Well nourished.  Vascular Dorsalis pedis pulses palpable bilaterally. Posterior tibial pulses palpable bilaterally. Capillary refill normal to all digits.  No cyanosis or clubbing noted. Pedal hair growth normal.  Neurologic Normal speech. Oriented to person, place, and time. Epicritic sensation to light touch grossly present bilaterally.  Dermatologic Nails well groomed and normal in appearance. No open  wounds. No skin lesions.  Orthopedic: Pain on palpation to the left ankle mild deep intra-articular pain noted pain at the medial lateral border of the ankle.  No pain at the Achilles tendon no pain at the peroneal tendon no pain at the ATFL ligament   Radiographs: None Assessment:   1. Capsulitis of ankle, left   2. Neuropathy    Plan:  Patient was evaluated and treated and all questions answered.  Left ankle capsulitis - All questions and concerns were discussed with the patient in extensive  detail given the amount of pain that he is experiencing he will benefit from steroid injection to help decrease active plantar complaints of joint pain.  Patient agrees with plan like to proceed with steroid injection -A steroid injection was performed at Left ankle using 1% plain Lidocaine  and 10 mg of Kenalog. This was well tolerated.  Neuropathy pain - Given the amount of neuropathy pain he is experiencing benefit from gabapentin 300 mg he states understanding of acute proceed with that.   No follow-ups on file.

## 2024-09-04 ENCOUNTER — Ambulatory Visit (INDEPENDENT_AMBULATORY_CARE_PROVIDER_SITE_OTHER): Admitting: Ophthalmology

## 2024-09-04 ENCOUNTER — Encounter (INDEPENDENT_AMBULATORY_CARE_PROVIDER_SITE_OTHER): Payer: Self-pay | Admitting: Ophthalmology

## 2024-09-04 DIAGNOSIS — Z961 Presence of intraocular lens: Secondary | ICD-10-CM

## 2024-09-04 DIAGNOSIS — I10 Essential (primary) hypertension: Secondary | ICD-10-CM

## 2024-09-04 DIAGNOSIS — Z794 Long term (current) use of insulin: Secondary | ICD-10-CM

## 2024-09-04 DIAGNOSIS — H18523 Epithelial (juvenile) corneal dystrophy, bilateral: Secondary | ICD-10-CM

## 2024-09-04 DIAGNOSIS — H35033 Hypertensive retinopathy, bilateral: Secondary | ICD-10-CM

## 2024-09-04 DIAGNOSIS — Z7984 Long term (current) use of oral hypoglycemic drugs: Secondary | ICD-10-CM

## 2024-09-04 DIAGNOSIS — Z8619 Personal history of other infectious and parasitic diseases: Secondary | ICD-10-CM

## 2024-09-04 DIAGNOSIS — H401130 Primary open-angle glaucoma, bilateral, stage unspecified: Secondary | ICD-10-CM

## 2024-09-04 DIAGNOSIS — E113213 Type 2 diabetes mellitus with mild nonproliferative diabetic retinopathy with macular edema, bilateral: Secondary | ICD-10-CM

## 2024-09-04 MED ORDER — FARICIMAB-SVOA 6 MG/0.05ML IZ SOSY
6.0000 mg | PREFILLED_SYRINGE | INTRAVITREAL | Status: AC | PRN
  Administered 2024-09-04: 6 mg via INTRAVITREAL

## 2024-09-09 ENCOUNTER — Inpatient Hospital Stay: Attending: Hematology

## 2024-09-09 DIAGNOSIS — D500 Iron deficiency anemia secondary to blood loss (chronic): Secondary | ICD-10-CM

## 2024-09-09 DIAGNOSIS — E538 Deficiency of other specified B group vitamins: Secondary | ICD-10-CM | POA: Insufficient documentation

## 2024-09-09 DIAGNOSIS — Z79899 Other long term (current) drug therapy: Secondary | ICD-10-CM | POA: Diagnosis not present

## 2024-09-09 DIAGNOSIS — D509 Iron deficiency anemia, unspecified: Secondary | ICD-10-CM | POA: Diagnosis present

## 2024-09-09 LAB — BASIC METABOLIC PANEL WITH GFR
Anion gap: 9 (ref 5–15)
BUN: 29 mg/dL — ABNORMAL HIGH (ref 8–23)
CO2: 32 mmol/L (ref 22–32)
Calcium: 8.5 mg/dL — ABNORMAL LOW (ref 8.9–10.3)
Chloride: 96 mmol/L — ABNORMAL LOW (ref 98–111)
Creatinine, Ser: 1.43 mg/dL — ABNORMAL HIGH (ref 0.61–1.24)
GFR, Estimated: 49 mL/min — ABNORMAL LOW (ref >60)
Glucose, Bld: 355 mg/dL — ABNORMAL HIGH (ref 70–99)
Potassium: 3.9 mmol/L (ref 3.5–5.1)
Sodium: 137 mmol/L (ref 135–145)

## 2024-09-09 LAB — CBC WITH DIFFERENTIAL/PLATELET
Abs Immature Granulocytes: 0.04 K/uL (ref 0.00–0.07)
Basophils Absolute: 0.1 K/uL (ref 0.0–0.1)
Basophils Relative: 1 %
Eosinophils Absolute: 0.2 K/uL (ref 0.0–0.5)
Eosinophils Relative: 2 %
HCT: 39.1 % (ref 39.0–52.0)
Hemoglobin: 11.4 g/dL — ABNORMAL LOW (ref 13.0–17.0)
Immature Granulocytes: 0 %
Lymphocytes Relative: 11 %
Lymphs Abs: 1.1 K/uL (ref 0.7–4.0)
MCH: 24.4 pg — ABNORMAL LOW (ref 26.0–34.0)
MCHC: 29.2 g/dL — ABNORMAL LOW (ref 30.0–36.0)
MCV: 83.5 fL (ref 80.0–100.0)
Monocytes Absolute: 0.8 K/uL (ref 0.1–1.0)
Monocytes Relative: 8 %
Neutro Abs: 8.6 K/uL — ABNORMAL HIGH (ref 1.7–7.7)
Neutrophils Relative %: 78 %
Platelets: 281 K/uL (ref 150–400)
RBC: 4.68 MIL/uL (ref 4.22–5.81)
RDW: 16.4 % — ABNORMAL HIGH (ref 11.5–15.5)
WBC: 10.8 K/uL — ABNORMAL HIGH (ref 4.0–10.5)
nRBC: 0 % (ref 0.0–0.2)

## 2024-09-09 LAB — SAMPLE TO BLOOD BANK

## 2024-09-09 LAB — IRON AND TIBC
Iron: 56 ug/dL (ref 45–182)
Saturation Ratios: 15 % — ABNORMAL LOW (ref 17.9–39.5)
TIBC: 382 ug/dL (ref 250–450)
UIBC: 326 ug/dL

## 2024-09-09 LAB — FERRITIN: Ferritin: 40 ng/mL (ref 24–336)

## 2024-09-15 NOTE — Progress Notes (Unsigned)
 [place] [address], [place] [address]   CLINIC:  Medical Oncology/Hematology  PCP:  Lucas Suzzane POUR, MD [address] [place] [address] [phone]   REASON FOR VISIT:  Follow-up for normocytic anemia  PRIOR THERAPY: History of PRBC transfusions (2023)  CURRENT THERAPY: Oral iron , B12 supplement, intermittent IV iron   INTERVAL HISTORY:   Lucas Dud[place] 83 y.o. male returns for routine follow-up of normocytic anemia.   He was last seen by Lucas Barefoot PA-C on 06/17/2024. He received IV Feraheme x 2 in September 2025. He is accompanied today by his friend Lucas [place].  His daughter Lucas Dud[place] joins via telephone.  He received octreotide  injections via GI office from 02/02/2024 through 06/02/2024.  Patient reports that octreotide  was discontinued due to insurance issues, but he has not followed up with GI since that time.  He had previously felt some improvement after starting octreotide , but has been feeling poorly for the past few months.  At today's visit, he reports feeling very poorly with significant fatigue. He is having a hard time sleeping. He reports dyspnea on exertion, feel like he is fluid overloaded. He has significant ice pica. No recurrent melena since octreotide  was stopped, but does have intermittent hemorrhoid bleeding (drips into toilet). Denies any recent epistaxis. He denies headaches, lightheadedness, or syncope. He is taking daily combination iron /B12 supplement. He takes daily aspirin  81 mg.  He has little to no energy and 25% appetite.  He endorses that he is maintaining a stable weight.  ASSESSMENT & PLAN:  1.  Normocytic anemia (iron  deficiency & B12 deficiency): - Patient seen at the request of Mitzie Boettcher, NP - Hospitalized in August 2023 with Hgb 7.6, received 2 units PRBC.  He underwent EGD and colonoscopy which did not show any evidence of bleeding.  Subsequent enteroscopy again did not show a clear source of  bleeding. 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  Small bowel nodule - junction distal duodenum/jejunum.  Irregular with a submucosal component, suspicious in appearance.  Pathology was benign. - Colonoscopy (05/24/2022): 5 mm polyp in the sigmoid colon, diverticulosis in the descending colon and at the splenic flexure.  Pathology - hyperplastic polyp. - EGD (05/24/2022): Normal esophagus, small hiatal hernia, friable gastric mucosa, couple of tiny gastric erosions, normal duodenal bulb and second part of duodenum. - Small bowel endoscopy by Dr. 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 - He was on Eliquis  until September 2023, now s/p Watchman procedure.  Takes aspirin  daily. - He was started monthly octerotide via Gastroenterology as of April 2025.  Discontinued in August 2025 due to insurance reasons. - Reports intermittent scant rectal bleeding from hemorrhoids.  Previously had issues with melanotic stool, but none since starting octreotide  injections.  Has not recurred after stopping octreotide . - Hematology workup (09/03/2022): Hemoccult stool positive x 2 Normal reticulocytes, haptoglobin, DAT/Coombs. Persistent but improved iron  deficiency with ferritin 33, iron  saturation 17% Vitamin B12 deficiency with low B12 162, normal MMA.  Normal copper . SPEP negative.  Minimal elevations in free light chains with kappa 41.1, normal lambda 21.3, and mildly elevated ratio 1.93.  Immunofixation normal. He has CKD stage IIIa/b, does not follow with nephrology - Most recent IV iron  with Feraheme x 2 in September 2025 - He is taking iron  and B12 supplement daily  - Most recent labs (09/09/2024):  Hgb 11.4/MCV 83.5 Ferritin 40, iron  saturation  15% (Labs from August 2025 showed Vitamin B12 489, normal MMA) - DIFFERENTIAL DIAGNOSIS: Suspect iron  deficiency  anemia in the setting of occult GI bleeding as well as vitamin B12 deficiency.  Also has aspect of anemia related to his CKD. - PLAN: Recommend IV Feraheme x 2.   - Continue daily iron  and B12 supplement - Labs in 3 months = CBC/D, BB sample, ferritin, iron /TIBC B12, MMA - OFFICE visit in 3 months  - Continue GI follow-up (Dr. Eartha and NP Mitzie Boettcher).  He is overdue for his 49-month follow-up visit. - Instructed to reach out sooner if he has any recurrent melena or symptoms of worsening iron  deficiency.   2.  Social/family history: - Lives at home by himself.  He is independent of ADLs and IADLs.  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  He has exposure to torrent chemicals.  He smoked 1 pack/day for 35 years and quit smoking in 1992. - No family history of significant anemia. - Mother had mesothelioma.  Maternal aunt had colon cancer.  Maternal uncle had adrenal gland cancer.  PLAN SUMMARY:   >> IV Feraheme x 2 >> Labs in 3 months = CBC/D, BMP, ferritin, iron /TIBC, B12, MMA >> OFFICE visit in 3 months (1 week after labs)      REVIEW OF SYSTEMS:   Review of Systems  Constitutional:  Positive for fatigue. Negative for appetite change, chills, diaphoresis, fever and unexpected weight change.  HENT:   Negative for lump/mass and nosebleeds.   Eyes:  Negative for eye problems.  Respiratory:  Positive for cough and shortness of breath (with exertion). Negative for hemoptysis.   Cardiovascular:  Positive for leg swelling. Negative for chest pain and palpitations.  Gastrointestinal:  Negative for abdominal pain, blood in stool, constipation, diarrhea, nausea and vomiting.  Genitourinary:  Negative for hematuria.   Musculoskeletal:  Positive for arthralgias (right hip), gait problem and myalgias.  Skin: Negative.   Neurological:  Positive for extremity weakness and gait problem. Negative for dizziness, headaches, light-headedness and numbness.  Hematological:   Does not bruise/bleed easily.  Psychiatric/Behavioral:  Positive for sleep disturbance.      PHYSICAL EXAM:  ECOG PERFORMANCE STATUS: 1 - Symptomatic but completely ambulatory  Vitals:   09/16/24 1319  BP: 131/79  Pulse: 76  Resp: 20  Temp: 98 F (36.7 C)  SpO2: 90%   Physical Exam Constitutional:      Appearance: Normal appearance. He is obese.  Cardiovascular:     Heart sounds: Normal heart sounds.  Pulmonary:     Breath sounds: Normal breath sounds.  Neurological:     General: No focal deficit present.     Mental Status: Mental status is at baseline.  Psychiatric:        Behavior: Behavior normal. Behavior is cooperative.     PAST MEDICAL/SURGICAL HISTORY:  Past Medical History:  Diagnosis Date   Arthritis    Atrial fibrillation (HCC)    CAD (coronary artery disease)    a. Cath 03/17/15 showing 100% ostial D1, 50% prox LAD to mid LAD, 40% RPDA stenosis. Med rx. // Myoview  01/2020: EF 31 diffuse perfusion defect without reversibility (suspect artifact); reviewed with Dr. Suellen study felt to be low risk   Cataract    Mixed form OD   Chronic diastolic CHF (congestive heart failure) (HCC)    Chronically elevated hemidiaphragm - Right Side    Diabetic retinopathy (HCC)    NPDR OU   Essential  hypertension    Glaucoma    POAG OU   History of gout    Hyperlipidemia    Hypertensive retinopathy    OU   Kidney stones    Melanoma of neck (HCC)    NICM (nonischemic cardiomyopathy) (HCC)    OSA on CPAP 2012   Prostate cancer (HCC)    Type II diabetes mellitus (HCC)    Past Surgical History:  Procedure Laterality Date   ABDOMINAL HERNIA REPAIR     w/mesh   ATRIAL FIBRILLATION ABLATION N/A 06/06/2021   Procedure: ATRIAL FIBRILLATION ABLATION;  Surgeon: Kelsie Agent, MD;  Location: [facility];  Service: Cardiovascular;  Laterality: N/A;   BIOPSY  05/24/2022   Procedure: BIOPSY;  Surgeon: Shaaron Lamar HERO, MD;  Location: [facility];  Service:  Endoscopy;;   BIOPSY  05/30/2022   Procedure: BIOPSY;  Surgeon: Shaaron Lamar HERO, MD;  [facility];  Service: Endoscopy;;   BIOPSY  01/10/2023   Procedure: BIOPSY;  Surgeon: Wilhelmenia Aloha Raddle., MD;  Location: [facility];  Service: Gastroenterology;;   CARDIAC CATHETERIZATION N/A 03/17/2015   Procedure: Left Heart Cath and Coronary Angiography;  Surgeon: Dorn JINNY Lesches, MD;  Location: [facility] INVASIVE CV LAB;  Service: Cardiovascular;  Laterality: N/A;   CARDIOVERSION N/A 08/09/2021   Procedure: CARDIOVERSION;  Surgeon: Raford Riggs, MD;  Location: [facility];  Service: Cardiovascular;  Laterality: N/A;   CARDIOVERSION N/A 11/10/2021   Procedure: CARDIOVERSION;  Surgeon: Sheena Pugh, DO;  Location: [facility];  Service: Cardiovascular;  Laterality: N/A;   carotid doppler  09/17/2008   rigt and left ICAs 0-49%;mildly  abnormal   CATARACT EXTRACTION Left 2020   Dr. Lelon   COLONOSCOPY N/A 05/16/2017   Procedure: COLONOSCOPY;  Surgeon: Golda Claudis PENNER, MD;  [facility];  Service: Endoscopy;  Laterality: N/A;  930   COLONOSCOPY WITH PROPOFOL  N/A 05/24/2022   Procedure: COLONOSCOPY WITH PROPOFOL ;  Surgeon: Shaaron Lamar HERO, MD;  [facility];  Service: Endoscopy;  Laterality: N/A;   DOPPLER ECHOCARDIOGRAPHY  05/25/2009   EF 50-55%,LA mildly dilated, LV function normal   ELECTROPHYSIOLOGIC STUDY N/A 04/05/2015   Procedure: Cardioversion;  Surgeon: Jerel Balding, MD;  Location: [facility];  Service: Cardiovascular;  Laterality: N/A;   ELECTROPHYSIOLOGIC STUDY N/A 09/06/2015   Procedure: Atrial Fibrillation Ablation;  Surgeon: Agent Kelsie, MD;  Location: [facility] INVASIVE CV LAB;  Service: Cardiovascular;  Laterality: N/A;   ELECTROPHYSIOLOGIC STUDY N/A 07/12/2016   redo afib ablation by Dr Kelsie   ENTEROSCOPY N/A 05/30/2022   Procedure: ENTEROSCOPY;  Surgeon: Shaaron Lamar HERO, MD;  [facility];  Service: Endoscopy;  Laterality: N/A;    ENTEROSCOPY N/A 01/07/2024   Procedure: ENTEROSCOPY;  Surgeon: Eartha Angelia Sieving, MD;  [facility];  Service: Gastroenterology;  Laterality: N/A;  1:45PM;ASA 3   ESOPHAGOGASTRODUODENOSCOPY (EGD) WITH PROPOFOL  N/A 05/24/2022   Procedure: ESOPHAGOGASTRODUODENOSCOPY (EGD) WITH PROPOFOL ;  Surgeon: Shaaron Lamar HERO, MD;  [facility];  Service: Endoscopy;  Laterality: N/A;   ESOPHAGOGASTRODUODENOSCOPY (EGD) WITH PROPOFOL  N/A 01/10/2023   Procedure: ESOPHAGOGASTRODUODENOSCOPY (EGD) WITH PROPOFOL ;  Surgeon: Wilhelmenia Aloha Raddle., MD;  Location: [facility];  Service: Gastroenterology;  Laterality: N/A;   EUS N/A 01/10/2023   Procedure: UPPER ENDOSCOPIC ULTRASOUND (EUS) RADIAL;  Surgeon: Wilhelmenia Aloha Raddle., MD;  Location: [facility];  Service: Gastroenterology;  Laterality: N/A;   EXCISIONAL HEMORRHOIDECTOMY     inside and out   EYE SURGERY Left 2020  Cat Sx - Dr. Lelon   FINE NEEDLE ASPIRATION Right    knee; drew ~ 1 quart off   GIVENS CAPSULE STUDY N/A 05/25/2022   Procedure: GIVENS CAPSULE STUDY;  Surgeon: Cindie Carlin POUR, DO;  [facility];  Service: Endoscopy;  Laterality: N/A;   GIVENS CAPSULE STUDY N/A 01/23/2023   Procedure: GIVENS CAPSULE STUDY;  Surgeon: Eartha Angelia Sieving, MD;  [facility];  Service: Gastroenterology;  Laterality: N/A;  8:30am   HERNIA REPAIR     HOT HEMOSTASIS N/A 01/10/2023   Procedure: HOT HEMOSTASIS (ARGON PLASMA COAGULATION/BICAP);  Surgeon: Wilhelmenia Aloha Raddle., MD;  Location: [facility];  Service: Gastroenterology;  Laterality: N/A;   HOT HEMOSTASIS  01/07/2024   Procedure: EGD, WITH ARGON PLASMA COAGULATION;  Surgeon: Eartha Angelia Sieving, MD;  [facility];  Service: Gastroenterology;;   LAPAROSCOPIC CHOLECYSTECTOMY     LEFT ATRIAL APPENDAGE OCCLUSION N/A 06/28/2022   Procedure: LEFT ATRIAL APPENDAGE OCCLUSION;  Surgeon: Wonda Sharper, MD;  Location: [facility] INVASIVE CV LAB;   Service: Cardiovascular;  Laterality: N/A;   MELANOMA EXCISION Right    neck   NM MYOCAR PERF WALL MOTION  02/21/2012   EF 61% ,EXERCISE 7 METS. exercise stopped due to wheezing and shortness of breathe   POLYPECTOMY  05/16/2017   Procedure: POLYPECTOMY;  Surgeon: Golda Claudis PENNER, MD;  [facility];  Service: Endoscopy;;  colon   POLYPECTOMY  05/24/2022   Procedure: POLYPECTOMY;  Surgeon: Shaaron Lamar HERO, MD;  [facility];  Service: Endoscopy;;   POLYPECTOMY  01/10/2023   Procedure: POLYPECTOMY;  Surgeon: Wilhelmenia Aloha Raddle., MD;  Location: [facility];  Service: Gastroenterology;;   PROSTATECTOMY     RIGHT/LEFT HEART CATH AND CORONARY ANGIOGRAPHY N/A 01/27/2024   Procedure: RIGHT/LEFT HEART CATH AND CORONARY ANGIOGRAPHY;  Surgeon: Zenaida Morene PARAS, MD;  Location: [facility] INVASIVE CV LAB;  Service: Cardiovascular;  Laterality: N/A;   SHOULDER OPEN ROTATOR CUFF REPAIR Right X 2   SUBMUCOSAL TATTOO INJECTION  05/30/2022   Procedure: SUBMUCOSAL TATTOO INJECTION;  Surgeon: Shaaron Lamar HERO, MD;  [facility];  Service: Endoscopy;;   SUBMUCOSAL TATTOO INJECTION  01/10/2023   Procedure: SUBMUCOSAL TATTOO INJECTION;  Surgeon: Wilhelmenia Aloha Raddle., MD;  Location: [facility];  Service: Gastroenterology;;   TEE WITHOUT CARDIOVERSION N/A 09/05/2015   Procedure: TRANSESOPHAGEAL ECHOCARDIOGRAM (TEE);  Surgeon: Wilbert JONELLE Bihari, MD;  Location: [facility] [place] [facility];  Service: Cardiovascular;  Laterality: N/A;   TEE WITHOUT CARDIOVERSION N/A 06/28/2022   Procedure: TRANSESOPHAGEAL ECHOCARDIOGRAM (TEE);  Surgeon: Wonda Sharper, MD;  Location: [facility] INVASIVE CV LAB;  Service: Cardiovascular;  Laterality: N/A;    SOCIAL HISTORY:  Social History   Socioeconomic History   Marital status: Widowed    Spouse name: Not on file   Number of children: 1   Years of education: Not on file   Highest education level: GED or equivalent  Occupational History   Occupation: Retired  Tobacco  Use   Smoking status: Former    Current packs/day: 0.00    Average packs/day: 2.0 packs/day for 27.0 years (54.0 ttl pk-yrs)    Types: Cigarettes    Start date: 72    Quit date: 1992    Years since quitting: 33.9    Passive exposure: Current   Smokeless tobacco: Never   Tobacco comments:    Former smoker 09/27/21  Vaping Use   Vaping status: Never Used  Substance and Sexual Activity   Alcohol use: No  Alcohol/week: 0.0 standard drinks of alcohol    Comment: used to drink; stopped ~ 2008   Drug use: No   Sexual activity: Not Currently  Other Topics Concern   Not on file  Social History Narrative   Lives in [place], [place] with wife.   Social Drivers of Corporate Investment Banker Strain: Low Risk  (02/10/2024)   Overall Financial Resource Strain (CARDIA)    Difficulty of Paying Living Expenses: Not very hard  Food Insecurity: No Food Insecurity (02/10/2024)   Hunger Vital Sign    Worried About Running Out of Food in the Last Year: Never true    Ran Out of Food in the Last Year: Never true  Transportation Needs: No Transportation Needs (02/10/2024)   PRAPARE - Administrator, Civil Service (Medical): No    Lack of Transportation (Non-Medical): No  Physical Activity: Inactive (02/10/2024)   Exercise Vital Sign    Days of Exercise per Week: 0 days    Minutes of Exercise per Session: 0 min  Stress: No Stress Concern Present (02/10/2024)   [employer] of Occupational Health - Occupational Stress Questionnaire    Feeling of Stress : Only a little  Social Connections: Moderately Integrated (02/10/2024)   Social Connection and Isolation Panel    Frequency of Communication with Friends and Family: More than three times a week    Frequency of Social Gatherings with Friends and Family: More than three times a week    Attends Religious Services: More than 4 times per year    Active Member of [employer] or Organizations: Yes    Attends Banker Meetings: More  than 4 times per year    Marital Status: Widowed  Recent Concern: Social Connections - Moderately Isolated (11/20/2023)   Social Connection and Isolation Panel    Frequency of Communication with Friends and Family: More than three times a week    Frequency of Social Gatherings with Friends and Family: Once a week    Attends Religious Services: More than 4 times per year    Active Member of [employer] or Organizations: No    Attends Banker Meetings: Never    Marital Status: Widowed  Intimate Partner Violence: Not At Risk (01/29/2024)   Humiliation, Afraid, Rape, and Kick questionnaire    Fear of Current or Ex-Partner: No    Emotionally Abused: No    Physically Abused: No    Sexually Abused: No    FAMILY HISTORY:  Family History  Problem Relation Age of Onset   Heart disease Mother    Lung cancer Mother    Heart attack Mother 52   Diabetes Father    Heart disease Father    Stroke Brother    Healthy Daughter    Colon cancer Maternal Aunt        70s    CURRENT MEDICATIONS:  Outpatient Encounter Medications as of 09/16/2024  Medication Sig   albuterol  (VENTOLIN  HFA) 108 (90 Base) MCG/ACT inhaler Inhale 2 puffs into the lungs every 6 (six) hours as needed for wheezing or shortness of breath.   ammonium lactate  (AMLACTIN DAILY) 12 % lotion Apply 1 Application topically as needed.   aspirin  81 MG chewable tablet Chew 81 mg by mouth daily.   camphor-menthol (ANTI-ITCH) lotion Apply 1 Application topically daily as needed for itching.   colchicine  0.6 MG tablet TAKE 2 TABLETS ON DAY 1, FOLLOWED BY 1 TABLET 1 HOUR LATER. PLEASE TAKE 1 TABLET ONCE DAILY FROM  DAY 2 TILL SWELLING IMPROVES.   COMBIGAN  0.2-0.5 % ophthalmic solution Place 1 drop into both eyes 2 (two) times daily.   dapagliflozin  propanediol (FARXIGA ) 10 MG TABS tablet Take 1 tablet (10 mg total) by mouth daily.   diphenhydramine -acetaminophen  (TYLENOL  PM) 25-500 MG TABS tablet Take 1 tablet by mouth at bedtime.    dorzolamide  (TRUSOPT ) 2 % ophthalmic solution 1 drop 2 (two) times daily.   ELDERBERRY PO Take 2 tablets by mouth daily. Chewable   ezetimibe  (ZETIA ) 10 MG tablet Take 1 tablet (10 mg total) by mouth daily.   ferrous sulfate  325 (65 FE) MG tablet Take 1 tablet (325 mg total) by mouth daily with breakfast.   fluticasone  (FLONASE ) 50 MCG/ACT nasal spray Place 1 spray into both nostrils daily. SPRAY 2 SPRAYS INTO EACH NOSTRIL EVERY DAY   gabapentin  (NEURONTIN ) 300 MG capsule Take 1 capsule (300 mg total) by mouth 3 (three) times daily.   gemfibrozil  (LOPID ) 600 MG tablet Take 600 mg by mouth at bedtime.    glimepiride  (AMARYL ) 2 MG tablet Take 1 tablet (2 mg total) by mouth daily with breakfast.   Insulin  Glargine (BASAGLAR  KWIKPEN) 100 UNIT/ML INJECT 45 UNITS INTO THE SKIN AT BEDTIME.   magnesium  gluconate (MAGONATE) 500 MG tablet Take 500 mg by mouth daily.   mineral oil liquid Take 15 mLs by mouth daily as needed for moderate constipation.   Omega-3 Fatty Acids (FISH OIL EXTRA STRENGTH) 1200 MG CAPS Take 1 capsule by mouth daily.   OVER THE COUNTER MEDICATION Take 1 tablet by mouth daily. Beets Chew   pantoprazole  (PROTONIX ) 40 MG tablet TAKE 1 TABLET BY MOUTH EVERY DAY   torsemide  (DEMADEX ) 20 MG tablet 40 MG IN THE MORNING AND 20 MG IN THE EVENING (Patient taking differently: Take 20 mg by mouth 2 (two) times daily. May take an extra 2 tabs as needed in the evening)   vitamin E 45 MG (100 UNITS) capsule Take 100 Units by mouth daily.   zinc  gluconate 50 MG tablet Take 50 mg by mouth daily.   [DISCONTINUED] methylPREDNISolone  (MEDROL  DOSEPAK) 4 MG TBPK tablet Take as package instructions.   No facility-administered encounter medications on file as of 09/16/2024.    ALLERGIES:  Allergies  Allergen Reactions   Silicone Dermatitis   Other Dermatitis and Other (See Comments)    Adhesive agent (substance)   Azithromycin  Nausea Only   Tape Rash and Other (See Comments)    Causes skin redness,  Use paper tape only.    LABORATORY DATA:  I have reviewed the labs as listed.  CBC    Component Value Date/Time   WBC 10.8 (H) 09/09/2024 1311   RBC 4.68 09/09/2024 1311   HGB 11.4 (L) 09/09/2024 1311   HGB 10.9 (L) 05/20/2024 1551   HCT 39.1 09/09/2024 1311   HCT 34.6 (L) 05/20/2024 1551   PLT 281 09/09/2024 1311   PLT 320 05/20/2024 1551   MCV 83.5 09/09/2024 1311   MCV 89 05/20/2024 1551   MCH 24.4 (L) 09/09/2024 1311   MCHC 29.2 (L) 09/09/2024 1311   RDW 16.4 (H) 09/09/2024 1311   RDW 14.2 05/20/2024 1551   LYMPHSABS 1.1 09/09/2024 1311   LYMPHSABS 1.5 05/20/2024 1551   MONOABS 0.8 09/09/2024 1311   EOSABS 0.2 09/09/2024 1311   EOSABS 0.2 05/20/2024 1551   BASOSABS 0.1 09/09/2024 1311   BASOSABS 0.0 05/20/2024 1551      Latest Ref Rng & Units 09/09/2024    1:11 PM  05/20/2024    3:51 PM 02/12/2024    1:33 PM  CMP  Glucose 70 - 99 mg/dL 644  702  741   BUN 8 - 23 mg/dL 29  17  28    Creatinine 0.61 - 1.24 mg/dL 8.56  8.92  8.42   Sodium 135 - 145 mmol/L 137  137  132   Potassium 3.5 - 5.1 mmol/L 3.9  3.2  5.0   Chloride 98 - 111 mmol/L 96  92  94   CO2 22 - 32 mmol/L 32  27  25   Calcium  8.9 - 10.3 mg/dL 8.5  8.7  9.2   Total Protein 6.0 - 8.5 g/dL  7.1    Total Bilirubin 0.0 - 1.2 mg/dL  0.3    Alkaline Phos 44 - 121 IU/L  146    AST 0 - 40 IU/L  20    ALT 0 - 44 IU/L  10      DIAGNOSTIC IMAGING:  I have independently reviewed the relevant imaging and discussed with the patient.   WRAP UP:  All questions were answered. The patient knows to call the clinic with any problems, questions or concerns.  Medical decision making: Moderate  Time spent on visit: I spent 20 minutes counseling the patient face to face. The total time spent in the appointment was 30 minutes and more than 50% was on counseling.  Lucas CHRISTELLA Barefoot, PA-C  09/16/24 1:48 PM

## 2024-09-16 ENCOUNTER — Inpatient Hospital Stay: Admitting: Physician Assistant

## 2024-09-16 VITALS — BP 131/79 | HR 76 | Temp 98.0°F | Resp 20

## 2024-09-16 DIAGNOSIS — Z801 Family history of malignant neoplasm of trachea, bronchus and lung: Secondary | ICD-10-CM | POA: Insufficient documentation

## 2024-09-16 DIAGNOSIS — F5089 Other specified eating disorder: Secondary | ICD-10-CM | POA: Insufficient documentation

## 2024-09-16 DIAGNOSIS — I2510 Atherosclerotic heart disease of native coronary artery without angina pectoris: Secondary | ICD-10-CM | POA: Insufficient documentation

## 2024-09-16 DIAGNOSIS — D500 Iron deficiency anemia secondary to blood loss (chronic): Secondary | ICD-10-CM

## 2024-09-16 DIAGNOSIS — Z881 Allergy status to other antibiotic agents status: Secondary | ICD-10-CM | POA: Insufficient documentation

## 2024-09-16 DIAGNOSIS — R0602 Shortness of breath: Secondary | ICD-10-CM | POA: Insufficient documentation

## 2024-09-16 DIAGNOSIS — H401130 Primary open-angle glaucoma, bilateral, stage unspecified: Secondary | ICD-10-CM | POA: Insufficient documentation

## 2024-09-16 DIAGNOSIS — R0609 Other forms of dyspnea: Secondary | ICD-10-CM | POA: Insufficient documentation

## 2024-09-16 DIAGNOSIS — K649 Unspecified hemorrhoids: Secondary | ICD-10-CM | POA: Insufficient documentation

## 2024-09-16 DIAGNOSIS — Z5971 Insufficient health insurance coverage: Secondary | ICD-10-CM | POA: Insufficient documentation

## 2024-09-16 DIAGNOSIS — Z79899 Other long term (current) drug therapy: Secondary | ICD-10-CM | POA: Insufficient documentation

## 2024-09-16 DIAGNOSIS — D519 Vitamin B12 deficiency anemia, unspecified: Secondary | ICD-10-CM | POA: Insufficient documentation

## 2024-09-16 DIAGNOSIS — E785 Hyperlipidemia, unspecified: Secondary | ICD-10-CM | POA: Insufficient documentation

## 2024-09-16 DIAGNOSIS — Z87442 Personal history of urinary calculi: Secondary | ICD-10-CM | POA: Insufficient documentation

## 2024-09-16 DIAGNOSIS — Z833 Family history of diabetes mellitus: Secondary | ICD-10-CM | POA: Insufficient documentation

## 2024-09-16 DIAGNOSIS — D631 Anemia in chronic kidney disease: Secondary | ICD-10-CM | POA: Insufficient documentation

## 2024-09-16 DIAGNOSIS — Z87891 Personal history of nicotine dependence: Secondary | ICD-10-CM | POA: Insufficient documentation

## 2024-09-16 DIAGNOSIS — G479 Sleep disorder, unspecified: Secondary | ICD-10-CM | POA: Insufficient documentation

## 2024-09-16 DIAGNOSIS — Z85820 Personal history of malignant melanoma of skin: Secondary | ICD-10-CM | POA: Insufficient documentation

## 2024-09-16 DIAGNOSIS — M2550 Pain in unspecified joint: Secondary | ICD-10-CM | POA: Insufficient documentation

## 2024-09-16 DIAGNOSIS — I5042 Chronic combined systolic (congestive) and diastolic (congestive) heart failure: Secondary | ICD-10-CM | POA: Insufficient documentation

## 2024-09-16 DIAGNOSIS — Z9842 Cataract extraction status, left eye: Secondary | ICD-10-CM | POA: Insufficient documentation

## 2024-09-16 DIAGNOSIS — R531 Weakness: Secondary | ICD-10-CM | POA: Insufficient documentation

## 2024-09-16 DIAGNOSIS — M7989 Other specified soft tissue disorders: Secondary | ICD-10-CM | POA: Insufficient documentation

## 2024-09-16 DIAGNOSIS — N1831 Chronic kidney disease, stage 3a: Secondary | ICD-10-CM | POA: Insufficient documentation

## 2024-09-16 DIAGNOSIS — E669 Obesity, unspecified: Secondary | ICD-10-CM | POA: Insufficient documentation

## 2024-09-16 DIAGNOSIS — I4891 Unspecified atrial fibrillation: Secondary | ICD-10-CM | POA: Insufficient documentation

## 2024-09-16 DIAGNOSIS — Z7982 Long term (current) use of aspirin: Secondary | ICD-10-CM | POA: Insufficient documentation

## 2024-09-16 DIAGNOSIS — R059 Cough, unspecified: Secondary | ICD-10-CM | POA: Insufficient documentation

## 2024-09-16 DIAGNOSIS — G4733 Obstructive sleep apnea (adult) (pediatric): Secondary | ICD-10-CM | POA: Insufficient documentation

## 2024-09-16 DIAGNOSIS — Z8249 Family history of ischemic heart disease and other diseases of the circulatory system: Secondary | ICD-10-CM | POA: Insufficient documentation

## 2024-09-16 DIAGNOSIS — R5383 Other fatigue: Secondary | ICD-10-CM | POA: Insufficient documentation

## 2024-09-16 DIAGNOSIS — I130 Hypertensive heart and chronic kidney disease with heart failure and stage 1 through stage 4 chronic kidney disease, or unspecified chronic kidney disease: Secondary | ICD-10-CM | POA: Insufficient documentation

## 2024-09-16 DIAGNOSIS — Z9079 Acquired absence of other genital organ(s): Secondary | ICD-10-CM | POA: Insufficient documentation

## 2024-09-16 DIAGNOSIS — E538 Deficiency of other specified B group vitamins: Secondary | ICD-10-CM

## 2024-09-16 DIAGNOSIS — Z9049 Acquired absence of other specified parts of digestive tract: Secondary | ICD-10-CM | POA: Insufficient documentation

## 2024-09-16 DIAGNOSIS — R269 Unspecified abnormalities of gait and mobility: Secondary | ICD-10-CM | POA: Insufficient documentation

## 2024-09-16 DIAGNOSIS — Z794 Long term (current) use of insulin: Secondary | ICD-10-CM | POA: Insufficient documentation

## 2024-09-16 DIAGNOSIS — E1122 Type 2 diabetes mellitus with diabetic chronic kidney disease: Secondary | ICD-10-CM | POA: Insufficient documentation

## 2024-09-16 DIAGNOSIS — D509 Iron deficiency anemia, unspecified: Secondary | ICD-10-CM | POA: Insufficient documentation

## 2024-09-16 DIAGNOSIS — I428 Other cardiomyopathies: Secondary | ICD-10-CM | POA: Insufficient documentation

## 2024-09-16 DIAGNOSIS — Z800 Family history of malignant neoplasm of digestive organs: Secondary | ICD-10-CM | POA: Insufficient documentation

## 2024-09-16 DIAGNOSIS — Z823 Family history of stroke: Secondary | ICD-10-CM | POA: Insufficient documentation

## 2024-09-16 NOTE — Patient Instructions (Signed)
 [facility] at [facility] **VISIT SUMMARY & IMPORTANT INSTRUCTIONS **   You were seen today by Pleasant Barefoot PA-C for your anemia.    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  Continue to follow-up with gastroenterology and seek immediate medical attention if you notice any signs of major blood loss (bright red blood in the toilet or black/tarry bowel movements). You are overdue for an appointment with your gastroenterologist (Dr. Eartha and NP Mitzie Boettcher).  Please contact their office at [phone]. You also have some chronic kidney disease that is contributing to your anemia.  You can discuss this with your primary care provider, and may need a referral to a kidney doctor (nephrologist) in the future. Continue to take your iron  and B12 supplements daily. We will schedule you for 2 doses of IV iron .  FOLLOW-UP APPOINTMENT: Office visit in 3 months (labs 1 week prior)   ** Thank you for trusting me with your healthcare!  I strive to provide all of my patients with quality care at each visit.  If you receive a survey for this visit, I would be so grateful to you for taking the time to provide feedback.  Thank you in advance!  ~ Tajuan Dufault                                        Dr. Mickiel Davonna Pleasant Barefoot, PA-C       Delon Hope, NP   - - - - - - - - - - - - - - - - - -    Thank you for choosing [facility] at [facility] to provide your oncology and hematology care.  To afford each patient quality time with our provider, please arrive at least 15 minutes before your scheduled appointment time.   If you have a lab appointment with the Cancer Center please come in thru the Main Entrance and check in at the main information desk.  You need to re-schedule your appointment should you arrive 10 or more minutes late.  We strive to give you  quality time with our providers, and arriving late affects you and other patients whose appointments are after yours.  Also, if you no show three or more times for appointments you may be dismissed from the clinic at the providers discretion.     Again, thank you for choosing [facility].  Our hope is that these requests will decrease the amount of time that you wait before being seen by our physicians.       _____________________________________________________________  Should you have questions after your visit to [facility], please contact our office at [phone] and follow the prompts.  Our office hours are 8:00 a.m. and 4:30 p.m. Monday - Friday.  Please note that voicemails left after 4:00 p.m. may not be returned until the following business day.  We are closed weekends and major holidays.  You do have access to a nurse 24-7, just call the main number to the clinic [phone] and do not press any options, hold on the line and a nurse will answer the phone.    For prescription refill requests, have your pharmacy contact our office and allow 72 hours.     Should you  have questions after your visit to [facility], please contact our office at [phone] and follow the prompts.  Our office hours are 8:00 a.m. and 4:30 p.m. Monday - Friday.  Please note that voicemails left after 4:00 p.m. may not be returned until the following business day.  We are closed weekends and major holidays.  You do have access to a nurse 24-7, just call the main number to the clinic [phone] and do not press any options, hold on the line and a nurse will answer the phone.    For prescription refill requests, have your pharmacy contact our office and allow 72 hours.

## 2024-09-18 ENCOUNTER — Telehealth: Payer: Self-pay | Admitting: Pharmacy Technician

## 2024-09-18 NOTE — Telephone Encounter (Addendum)
 Exhausted attempsts reached. Still need patient to bring in financial information to finish PAP application for Sandostatin  LAR

## 2024-09-19 ENCOUNTER — Inpatient Hospital Stay (HOSPITAL_COMMUNITY)

## 2024-09-19 ENCOUNTER — Inpatient Hospital Stay (HOSPITAL_COMMUNITY)
Admission: EM | Admit: 2024-09-19 | Discharge: 2024-09-29 | Disposition: A | Discharge status: Skilled Nursing Facility | Source: Home / Self Care | Attending: Internal Medicine | Admitting: Internal Medicine

## 2024-09-19 ENCOUNTER — Encounter (HOSPITAL_COMMUNITY): Payer: Self-pay | Admitting: Family Medicine

## 2024-09-19 ENCOUNTER — Emergency Department (HOSPITAL_COMMUNITY)

## 2024-09-19 ENCOUNTER — Other Ambulatory Visit: Payer: Self-pay

## 2024-09-19 DIAGNOSIS — Z5971 Insufficient health insurance coverage: Secondary | ICD-10-CM

## 2024-09-19 DIAGNOSIS — N1831 Chronic kidney disease, stage 3a: Secondary | ICD-10-CM | POA: Diagnosis present

## 2024-09-19 DIAGNOSIS — E1122 Type 2 diabetes mellitus with diabetic chronic kidney disease: Secondary | ICD-10-CM | POA: Diagnosis present

## 2024-09-19 DIAGNOSIS — E1140 Type 2 diabetes mellitus with diabetic neuropathy, unspecified: Secondary | ICD-10-CM | POA: Diagnosis present

## 2024-09-19 DIAGNOSIS — Z95818 Presence of other cardiac implants and grafts: Secondary | ICD-10-CM | POA: Diagnosis not present

## 2024-09-19 DIAGNOSIS — E785 Hyperlipidemia, unspecified: Secondary | ICD-10-CM | POA: Diagnosis present

## 2024-09-19 DIAGNOSIS — I130 Hypertensive heart and chronic kidney disease with heart failure and stage 1 through stage 4 chronic kidney disease, or unspecified chronic kidney disease: Secondary | ICD-10-CM | POA: Diagnosis present

## 2024-09-19 DIAGNOSIS — Z794 Long term (current) use of insulin: Secondary | ICD-10-CM

## 2024-09-19 DIAGNOSIS — D509 Iron deficiency anemia, unspecified: Secondary | ICD-10-CM | POA: Diagnosis present

## 2024-09-19 DIAGNOSIS — M5416 Radiculopathy, lumbar region: Secondary | ICD-10-CM | POA: Diagnosis present

## 2024-09-19 DIAGNOSIS — K649 Unspecified hemorrhoids: Secondary | ICD-10-CM | POA: Diagnosis present

## 2024-09-19 DIAGNOSIS — I4891 Unspecified atrial fibrillation: Secondary | ICD-10-CM | POA: Diagnosis not present

## 2024-09-19 DIAGNOSIS — W19XXXD Unspecified fall, subsequent encounter: Secondary | ICD-10-CM | POA: Diagnosis not present

## 2024-09-19 DIAGNOSIS — I4819 Other persistent atrial fibrillation: Secondary | ICD-10-CM | POA: Diagnosis present

## 2024-09-19 DIAGNOSIS — D519 Vitamin B12 deficiency anemia, unspecified: Secondary | ICD-10-CM | POA: Diagnosis present

## 2024-09-19 DIAGNOSIS — R748 Abnormal levels of other serum enzymes: Secondary | ICD-10-CM | POA: Diagnosis not present

## 2024-09-19 DIAGNOSIS — M25569 Pain in unspecified knee: Secondary | ICD-10-CM | POA: Diagnosis present

## 2024-09-19 DIAGNOSIS — D72829 Elevated white blood cell count, unspecified: Secondary | ICD-10-CM | POA: Diagnosis not present

## 2024-09-19 DIAGNOSIS — J181 Lobar pneumonia, unspecified organism: Secondary | ICD-10-CM | POA: Diagnosis present

## 2024-09-19 DIAGNOSIS — Z7982 Long term (current) use of aspirin: Secondary | ICD-10-CM | POA: Diagnosis not present

## 2024-09-19 DIAGNOSIS — D6869 Other thrombophilia: Secondary | ICD-10-CM | POA: Diagnosis present

## 2024-09-19 DIAGNOSIS — G4733 Obstructive sleep apnea (adult) (pediatric): Secondary | ICD-10-CM

## 2024-09-19 DIAGNOSIS — I10 Essential (primary) hypertension: Secondary | ICD-10-CM | POA: Diagnosis not present

## 2024-09-19 DIAGNOSIS — Y92009 Unspecified place in unspecified non-institutional (private) residence as the place of occurrence of the external cause: Secondary | ICD-10-CM

## 2024-09-19 DIAGNOSIS — I25119 Atherosclerotic heart disease of native coronary artery with unspecified angina pectoris: Secondary | ICD-10-CM | POA: Diagnosis present

## 2024-09-19 DIAGNOSIS — J690 Pneumonitis due to inhalation of food and vomit: Secondary | ICD-10-CM | POA: Diagnosis present

## 2024-09-19 DIAGNOSIS — M1A00X0 Idiopathic chronic gout, unspecified site, without tophus (tophi): Secondary | ICD-10-CM | POA: Diagnosis present

## 2024-09-19 DIAGNOSIS — E872 Acidosis, unspecified: Secondary | ICD-10-CM | POA: Diagnosis present

## 2024-09-19 DIAGNOSIS — I272 Pulmonary hypertension, unspecified: Secondary | ICD-10-CM | POA: Diagnosis present

## 2024-09-19 DIAGNOSIS — R7881 Bacteremia: Secondary | ICD-10-CM | POA: Diagnosis present

## 2024-09-19 DIAGNOSIS — I5033 Acute on chronic diastolic (congestive) heart failure: Secondary | ICD-10-CM | POA: Diagnosis present

## 2024-09-19 DIAGNOSIS — A419 Sepsis, unspecified organism: Secondary | ICD-10-CM | POA: Diagnosis not present

## 2024-09-19 DIAGNOSIS — K257 Chronic gastric ulcer without hemorrhage or perforation: Secondary | ICD-10-CM | POA: Diagnosis present

## 2024-09-19 DIAGNOSIS — I428 Other cardiomyopathies: Secondary | ICD-10-CM | POA: Diagnosis present

## 2024-09-19 DIAGNOSIS — E782 Mixed hyperlipidemia: Secondary | ICD-10-CM | POA: Diagnosis not present

## 2024-09-19 DIAGNOSIS — G720 Drug-induced myopathy: Secondary | ICD-10-CM | POA: Diagnosis present

## 2024-09-19 DIAGNOSIS — E1165 Type 2 diabetes mellitus with hyperglycemia: Secondary | ICD-10-CM | POA: Diagnosis present

## 2024-09-19 DIAGNOSIS — I5042 Chronic combined systolic (congestive) and diastolic (congestive) heart failure: Secondary | ICD-10-CM | POA: Diagnosis present

## 2024-09-19 DIAGNOSIS — E876 Hypokalemia: Secondary | ICD-10-CM | POA: Diagnosis present

## 2024-09-19 DIAGNOSIS — R296 Repeated falls: Secondary | ICD-10-CM | POA: Diagnosis present

## 2024-09-19 DIAGNOSIS — T466X5A Adverse effect of antihyperlipidemic and antiarteriosclerotic drugs, initial encounter: Secondary | ICD-10-CM | POA: Diagnosis present

## 2024-09-19 DIAGNOSIS — I480 Paroxysmal atrial fibrillation: Secondary | ICD-10-CM | POA: Diagnosis not present

## 2024-09-19 DIAGNOSIS — R627 Adult failure to thrive: Secondary | ICD-10-CM | POA: Diagnosis present

## 2024-09-19 DIAGNOSIS — Z23 Encounter for immunization: Secondary | ICD-10-CM | POA: Diagnosis present

## 2024-09-19 DIAGNOSIS — E113293 Type 2 diabetes mellitus with mild nonproliferative diabetic retinopathy without macular edema, bilateral: Secondary | ICD-10-CM | POA: Diagnosis present

## 2024-09-19 DIAGNOSIS — D631 Anemia in chronic kidney disease: Secondary | ICD-10-CM | POA: Diagnosis present

## 2024-09-19 DIAGNOSIS — Z6832 Body mass index (BMI) 32.0-32.9, adult: Secondary | ICD-10-CM | POA: Diagnosis not present

## 2024-09-19 DIAGNOSIS — W19XXXA Unspecified fall, initial encounter: Secondary | ICD-10-CM | POA: Diagnosis present

## 2024-09-19 LAB — URINALYSIS, ROUTINE W REFLEX MICROSCOPIC
Bacteria, UA: NONE SEEN
Bilirubin Urine: NEGATIVE
Glucose, UA: >=500 mg/dL — AB
Ketones, ur: 5 mg/dL — AB
Leukocytes,Ua: NEGATIVE
Nitrite: NEGATIVE
Protein, ur: 30 mg/dL — AB
Specific Gravity, Urine: 1.021 (ref 1.005–1.030)
pH: 5 (ref 5.0–8.0)

## 2024-09-19 LAB — CBC WITH DIFFERENTIAL/PLATELET
Abs Immature Granulocytes: 0.14 K/uL — ABNORMAL HIGH (ref 0.00–0.07)
Basophils Absolute: 0 K/uL (ref 0.0–0.1)
Basophils Relative: 0 %
Eosinophils Absolute: 1 K/uL — ABNORMAL HIGH (ref 0.0–0.5)
Eosinophils Relative: 6 %
HCT: 41 % (ref 39.0–52.0)
Hemoglobin: 11.9 g/dL — ABNORMAL LOW (ref 13.0–17.0)
Immature Granulocytes: 1 %
Lymphocytes Relative: 5 %
Lymphs Abs: 0.9 K/uL (ref 0.7–4.0)
MCH: 23.9 pg — ABNORMAL LOW (ref 26.0–34.0)
MCHC: 29 g/dL — ABNORMAL LOW (ref 30.0–36.0)
MCV: 82.5 fL (ref 80.0–100.0)
Monocytes Absolute: 1.6 K/uL — ABNORMAL HIGH (ref 0.1–1.0)
Monocytes Relative: 9 %
Neutro Abs: 13.7 K/uL — ABNORMAL HIGH (ref 1.7–7.7)
Neutrophils Relative %: 79 %
Platelets: 271 K/uL (ref 150–400)
RBC: 4.97 MIL/uL (ref 4.22–5.81)
RDW: 17.9 % — ABNORMAL HIGH (ref 11.5–15.5)
Smear Review: NORMAL
WBC: 17.4 K/uL — ABNORMAL HIGH (ref 4.0–10.5)
nRBC: 0 % (ref 0.0–0.2)

## 2024-09-19 LAB — GLUCOSE, CAPILLARY
Glucose-Capillary: 317 mg/dL — ABNORMAL HIGH (ref 70–99)
Glucose-Capillary: 234 mg/dL — ABNORMAL HIGH (ref 70–99)

## 2024-09-19 LAB — COMPREHENSIVE METABOLIC PANEL WITH GFR
ALT: 14 U/L (ref 0–44)
AST: 49 U/L — ABNORMAL HIGH (ref 15–41)
Albumin: 3.6 g/dL (ref 3.5–5.0)
Alkaline Phosphatase: 107 U/L (ref 38–126)
Anion gap: 23 — ABNORMAL HIGH (ref 5–15)
BUN: 34 mg/dL — ABNORMAL HIGH (ref 8–23)
CO2: 23 mmol/L (ref 22–32)
Calcium: 8.9 mg/dL (ref 8.9–10.3)
Chloride: 91 mmol/L — ABNORMAL LOW (ref 98–111)
Creatinine, Ser: 1.44 mg/dL — ABNORMAL HIGH (ref 0.61–1.24)
GFR, Estimated: 49 mL/min — ABNORMAL LOW (ref >60)
Glucose, Bld: 239 mg/dL — ABNORMAL HIGH (ref 70–99)
Potassium: 3.6 mmol/L (ref 3.5–5.1)
Sodium: 137 mmol/L (ref 135–145)
Total Bilirubin: 2.2 mg/dL — ABNORMAL HIGH (ref 0.0–1.2)
Total Protein: 7.1 g/dL (ref 6.5–8.1)

## 2024-09-19 LAB — TROPONIN T, HIGH SENSITIVITY
Troponin T High Sensitivity: 91 ng/L — ABNORMAL HIGH (ref 0–19)
Troponin T High Sensitivity: 84 ng/L — ABNORMAL HIGH (ref 0–19)

## 2024-09-19 LAB — PROCALCITONIN: Procalcitonin: 1.01 ng/mL

## 2024-09-19 LAB — BLOOD GAS, VENOUS
Acid-Base Excess: 4.3 mmol/L — ABNORMAL HIGH (ref 0.0–2.0)
Bicarbonate: 31.5 mmol/L — ABNORMAL HIGH (ref 20.0–28.0)
Drawn by: 27160
O2 Saturation: 49.8 %
Patient temperature: 37
pCO2, Ven: 57 mmHg (ref 44–60)
pH, Ven: 7.35 (ref 7.25–7.43)
pO2, Ven: 34 mmHg (ref 32–45)

## 2024-09-19 LAB — HEMOGLOBIN A1C
Hgb A1c MFr Bld: 11.1 % — ABNORMAL HIGH (ref 4.8–5.6)
Mean Plasma Glucose: 271.87 mg/dL

## 2024-09-19 LAB — CK: Total CK: 518 U/L — ABNORMAL HIGH (ref 49–397)

## 2024-09-19 LAB — PRO BRAIN NATRIURETIC PEPTIDE: Pro Brain Natriuretic Peptide: 4082 pg/mL — ABNORMAL HIGH (ref <300.0)

## 2024-09-19 MED ORDER — SODIUM CHLORIDE 0.9 % IV SOLN
2.0000 g | Freq: Once | INTRAVENOUS | Status: AC
  Administered 2024-09-19: 2 g via INTRAVENOUS
  Filled 2024-09-19: qty 20

## 2024-09-19 MED ORDER — LORAZEPAM 2 MG/ML IJ SOLN: 0.5000 mg | Freq: Once | INTRAMUSCULAR | Status: DC | PRN

## 2024-09-19 MED ORDER — EZETIMIBE 10 MG PO TABS
10.0000 mg | ORAL_TABLET | Freq: Every day | ORAL | Status: DC
  Filled 2024-09-19: qty 1

## 2024-09-19 MED ORDER — TIMOLOL MALEATE 0.5 % OP SOLN
1.0000 [drp] | Freq: Two times a day (BID) | OPHTHALMIC | Status: DC
  Administered 2024-09-20 – 2024-09-29 (×19): 1 [drp] via OPHTHALMIC
  Filled 2024-09-19 (×3): qty 5

## 2024-09-19 MED ORDER — ONDANSETRON HCL 4 MG/2ML IJ SOLN: 4.0000 mg | Freq: Four times a day (QID) | INTRAMUSCULAR | Status: DC | PRN

## 2024-09-19 MED ORDER — INSULIN ASPART 100 UNIT/ML IJ SOLN
0.0000 [IU] | Freq: Three times a day (TID) | INTRAMUSCULAR | Status: DC
  Administered 2024-09-19: 7 [IU] via SUBCUTANEOUS
  Administered 2024-09-20 – 2024-09-23 (×3): 1 [IU] via SUBCUTANEOUS
  Administered 2024-09-23 – 2024-09-24 (×2): 2 [IU] via SUBCUTANEOUS
  Administered 2024-09-24 – 2024-09-26 (×3): 1 [IU] via SUBCUTANEOUS
  Administered 2024-09-27 (×2): 2 [IU] via SUBCUTANEOUS
  Administered 2024-09-27 – 2024-09-28 (×2): 1 [IU] via SUBCUTANEOUS
  Administered 2024-09-28: 12:00:00 2 [IU] via SUBCUTANEOUS
  Administered 2024-09-29 (×2): 1 [IU] via SUBCUTANEOUS
  Filled 2024-09-19 (×14): qty 1

## 2024-09-19 MED ORDER — SODIUM CHLORIDE 0.9 % IV SOLN: INTRAVENOUS | Status: DC

## 2024-09-19 MED ORDER — DAPAGLIFLOZIN PROPANEDIOL 10 MG PO TABS
10.0000 mg | ORAL_TABLET | Freq: Every day | ORAL | Status: DC
  Administered 2024-09-20 – 2024-09-29 (×10): 10 mg via ORAL
  Filled 2024-09-19 (×10): qty 1

## 2024-09-19 MED ORDER — FUROSEMIDE 10 MG/ML IJ SOLN
40.0000 mg | INTRAMUSCULAR | Status: AC
  Administered 2024-09-19: 40 mg via INTRAVENOUS
  Filled 2024-09-19: qty 4

## 2024-09-19 MED ORDER — BISACODYL 5 MG PO TBEC: 5.0000 mg | DELAYED_RELEASE_TABLET | Freq: Every day | ORAL | Status: DC | PRN

## 2024-09-19 MED ORDER — SODIUM CHLORIDE 0.9 % IV SOLN
500.0000 mg | INTRAVENOUS | Status: DC
  Administered 2024-09-19: 500 mg via INTRAVENOUS
  Filled 2024-09-19: qty 5

## 2024-09-19 MED ORDER — CHLORHEXIDINE GLUCONATE CLOTH 2 % EX PADS
6.0000 | MEDICATED_PAD | Freq: Every day | CUTANEOUS | Status: DC
  Administered 2024-09-19 – 2024-09-29 (×6): 6 via TOPICAL

## 2024-09-19 MED ORDER — OXYCODONE HCL 5 MG PO TABS: 5.0000 mg | ORAL_TABLET | Freq: Four times a day (QID) | ORAL | Status: DC | PRN

## 2024-09-19 MED ORDER — TRAZODONE HCL 50 MG PO TABS
25.0000 mg | ORAL_TABLET | Freq: Every evening | ORAL | Status: DC | PRN
  Administered 2024-09-20 – 2024-09-27 (×6): 25 mg via ORAL
  Filled 2024-09-19 (×6): qty 1

## 2024-09-19 MED ORDER — MAGNESIUM SULFATE IN D5W 1-5 GM/100ML-% IV SOLN
1.0000 g | Freq: Once | INTRAVENOUS | Status: AC
  Administered 2024-09-19: 1 g via INTRAVENOUS
  Filled 2024-09-19: qty 100

## 2024-09-19 MED ORDER — IPRATROPIUM-ALBUTEROL 0.5-2.5 (3) MG/3ML IN SOLN: 3.0000 mL | RESPIRATORY_TRACT | Status: DC | PRN

## 2024-09-19 MED ORDER — ENOXAPARIN SODIUM 40 MG/0.4ML IJ SOSY
40.0000 mg | PREFILLED_SYRINGE | INTRAMUSCULAR | Status: DC
  Administered 2024-09-19 – 2024-09-28 (×10): 40 mg via SUBCUTANEOUS
  Filled 2024-09-19 (×10): qty 0.4

## 2024-09-19 MED ORDER — DILTIAZEM HCL-DEXTROSE 125-5 MG/125ML-% IV SOLN (PREMIX)
5.0000 mg/h | INTRAVENOUS | Status: DC
  Administered 2024-09-19 (×2): 5 mg/h via INTRAVENOUS
  Filled 2024-09-19 (×2): qty 125

## 2024-09-19 MED ORDER — BRIMONIDINE TARTRATE 0.2 % OP SOLN
1.0000 [drp] | Freq: Two times a day (BID) | OPHTHALMIC | Status: DC
  Administered 2024-09-19 – 2024-09-29 (×19): 1 [drp] via OPHTHALMIC
  Filled 2024-09-19 (×4): qty 5

## 2024-09-19 MED ORDER — SODIUM CHLORIDE 0.9 % IV SOLN
2.0000 g | INTRAVENOUS | Status: AC
  Administered 2024-09-20 – 2024-09-23 (×4): 2 g via INTRAVENOUS
  Filled 2024-09-19 (×4): qty 20

## 2024-09-19 MED ORDER — DORZOLAMIDE HCL 2 % OP SOLN
1.0000 [drp] | Freq: Two times a day (BID) | OPHTHALMIC | Status: DC
  Administered 2024-09-19 – 2024-09-29 (×20): 1 [drp] via OPHTHALMIC
  Filled 2024-09-19 (×3): qty 10

## 2024-09-19 MED ORDER — ASPIRIN 81 MG PO CHEW
81.0000 mg | CHEWABLE_TABLET | Freq: Every day | ORAL | Status: DC
  Administered 2024-09-20 – 2024-09-29 (×10): 81 mg via ORAL
  Filled 2024-09-19 (×10): qty 1

## 2024-09-19 MED ORDER — INSULIN GLARGINE-YFGN 100 UNIT/ML ~~LOC~~ SOLN
35.0000 [IU] | Freq: Every day | SUBCUTANEOUS | Status: DC
  Administered 2024-09-19 – 2024-09-20 (×2): 35 [IU] via SUBCUTANEOUS
  Filled 2024-09-19 (×3): qty 0.35

## 2024-09-19 MED ORDER — ACETAMINOPHEN 325 MG PO TABS
650.0000 mg | ORAL_TABLET | Freq: Four times a day (QID) | ORAL | Status: DC
  Administered 2024-09-19 – 2024-09-29 (×27): 650 mg via ORAL
  Filled 2024-09-19 (×27): qty 2

## 2024-09-19 MED ORDER — ACETAMINOPHEN 650 MG RE SUPP
650.0000 mg | Freq: Four times a day (QID) | RECTAL | Status: DC
  Filled 2024-09-19: qty 1

## 2024-09-19 MED ORDER — ONDANSETRON HCL 4 MG PO TABS: 4.0000 mg | ORAL_TABLET | Freq: Four times a day (QID) | ORAL | Status: DC | PRN

## 2024-09-19 MED ORDER — FERROUS SULFATE 325 (65 FE) MG PO TABS
325.0000 mg | ORAL_TABLET | Freq: Every day | ORAL | Status: DC
  Administered 2024-09-20 – 2024-09-21 (×2): 325 mg via ORAL
  Filled 2024-09-19 (×2): qty 1

## 2024-09-19 MED ORDER — FENTANYL CITRATE (PF) 100 MCG/2ML IJ SOLN: 12.5000 ug | INTRAMUSCULAR | Status: DC | PRN

## 2024-09-19 MED ORDER — PANTOPRAZOLE SODIUM 40 MG PO TBEC
40.0000 mg | DELAYED_RELEASE_TABLET | Freq: Every evening | ORAL | Status: DC
  Administered 2024-09-19 – 2024-09-28 (×10): 40 mg via ORAL
  Filled 2024-09-19 (×10): qty 1

## 2024-09-19 MED ORDER — BRIMONIDINE TARTRATE-TIMOLOL 0.2-0.5 % OP SOLN: 1.0000 [drp] | Freq: Two times a day (BID) | OPHTHALMIC | Status: DC

## 2024-09-19 MED ORDER — INSULIN ASPART 100 UNIT/ML IJ SOLN
10.0000 [IU] | Freq: Three times a day (TID) | INTRAMUSCULAR | Status: DC
  Administered 2024-09-19 – 2024-09-21 (×4): 10 [IU] via SUBCUTANEOUS
  Filled 2024-09-19 (×4): qty 1

## 2024-09-19 NOTE — H&P (Addendum)
 History and Physical  [facility]  Lucas Dudley FMW:[id number] DOB: 1941-03-03 DOA: 09/19/2024  PCP: [name], MD  Patient coming from: Home by [facility]  Level of care: Stepdown  I have personally briefly reviewed patient's old medical records in [facility]  Chief Complaint: found down after fall at home   HPI: Lucas Dudley is a 83 year old male coronary artery disease, HFpEF, pulmonary hypertension, persistent A-fib, status post watchman implantation, hypertension, hyperlipidemia, OSA on CPAP, stage IIIa CKD, normocytic anemia, uncontrolled type 2 diabetes mellitus on insulin , statin myopathy, gout, diabetic neuropathy, chronic gastric erosion, obesity, DOE, constipation who currently lives alone.  He apparently had a fall about 4 days ago at home.  He did not seek medical treatment at the time.  He fell again 2 days ago and was not able to get up and had been on the floor for about 1-2 days.  He has been having difficulty for past few months with lower extremity weakness and his legs giving out.  They have not been able to determine the cause of this.  His daughter called EMS to check on him because she had not heard from him in a couple of days.  EMS found him to have right knee swelling.  He complained of shortness of breath and knee pain.  He has not had his medications in a couple of days.  The ED found him to be tachycardic in A-fib with RVR and was hypoxic and placed on 3 L nasal cannula.  His chest x-ray was suspicious for atelectasis and/or pneumonia.  He was started on IV diltiazem  infusion, IV antibiotics, Lasix , IV fluids.  He was noted to be tachypneic and had normal venous pH.  His blood sugar was 239.  His CK was 518.  Creatinine 1.44, GFR 48.5 mL/min and troponin 91.  White blood cell count 17.4, hemoglobin 11.9, platelets 271.  Bilirubin 2.2.  His trauma workup revealed normal pelvis imaging, right knee imaging reveals small suprapatellar joint effusion.  Admission  requested for further management.   Past Medical History:  Diagnosis Date   Arthritis    Atrial fibrillation (HCC)    CAD (coronary artery disease)    a. Cath 03/17/15 showing 100% ostial D1, 50% prox LAD to mid LAD, 40% RPDA stenosis. Med rx. // Myoview  01/2020: EF 31 diffuse perfusion defect without reversibility (suspect artifact); reviewed with Dr. Suellen study felt to be low risk   Cataract    Mixed form OD   Chronic diastolic CHF (congestive heart failure) (HCC)    Chronically elevated hemidiaphragm - Right Side    Diabetic retinopathy (HCC)    NPDR OU   Essential hypertension    Glaucoma    POAG OU   History of gout    Hyperlipidemia    Hypertensive retinopathy    OU   Kidney stones    Melanoma of neck (HCC)    NICM (nonischemic cardiomyopathy) (HCC)    OSA on CPAP 2012   Prostate cancer (HCC)    Type II diabetes mellitus (HCC)     Past Surgical History:  Procedure Laterality Date   ABDOMINAL HERNIA REPAIR     w/mesh   ATRIAL FIBRILLATION ABLATION N/A 06/06/2021   Procedure: ATRIAL FIBRILLATION ABLATION;  Surgeon: Kelsie Agent, MD;  [facility]: [facility] INVAS[facility] CV LAB;  Service: Cardiovascular;  Laterality: N/A;   BIOPSY  05/24/2022   Procedure: BIOPSY;  Surgeon: Shaaron Lamar HERO, MD;  [facility]: [facility];  Service:  Endoscopy;;   BIOPSY  05/30/2022   Procedure: BIOPSY;  Surgeon: Shaaron Lamar HERO, MD;  [facility];  Service: Endoscopy;;   BIOPSY  01/10/2023   Procedure: BIOPSY;  Surgeon: Wilhelmenia Aloha Raddle., MD;  Location: [facility];  Service: Gastroenterology;;   CARDIAC CATHETERIZATION N/A 03/17/2015   Procedure: Left Heart Cath and Coronary Angiography;  Surgeon: Dorn JINNY Lesches, MD;  Location: [facility] INVASIVE CV LAB;  Service: Cardiovascular;  Laterality: N/A;   CARDIOVERSION N/A 08/09/2021   Procedure: CARDIOVERSION;  Surgeon: Raford Riggs, MD;  Location: [facility] ENDOSCOPY;  Service: Cardiovascular;  Laterality: N/A;   CARDIOVERSION N/A 11/10/2021    Procedure: CARDIOVERSION;  Surgeon: Sheena Pugh, DO;  Location: [facility]OS[facility];  Service: Cardiovascular;  Laterality: N/A;   carotid doppler  09/17/2008   rigt and left ICAs 0-49%;mildly  abnormal   CATARACT EXTRACTION Left 2020   Dr. Lelon   COLONOSCOPY N/A 05/16/2017   Procedure: COLONOSCOPY;  Surgeon: Golda Claudis PENNER, MD;  [facility];  Service: Endoscopy;  Laterality: N/A;  930   COLONOSCOPY WITH PROPOFOL  N/A 05/24/2022   Procedure: COLONOSCOPY WITH PROPOFOL ;  Surgeon: Shaaron Lamar HERO, MD;  [facility];  Service: Endoscopy;  Laterality: N/A;   DOPPLER ECHOCARDIOGRAPHY  05/25/2009   EF 50-55%,LA mildly dilated, LV function normal   ELECTROPHYSIOLOGIC STUDY N/A 04/05/2015   Procedure: Cardioversion;  Surgeon: Jerel Balding, MD;  [facility]: [facility];  Service: Cardiovascular;  Laterality: N/A;   ELECTROPHYSIOLOGIC STUDY N/A 09/06/2015   Procedure: Atrial Fibrillation Ablation;  Surgeon: [name], MD;  Location: [facility] INVASIVE CV LAB;  Service: Cardiovascular;  Laterality: N/A;   ELECTROPHYSIOLOGIC STUDY N/A 07/12/2016   redo afib ablation by Dr Rakers   ENTEROSCOPY N/A 05/30/2022   Procedure: ENTEROSCOPY;  Surgeon: Shaaron Lamar HERO, MD;  [facility];  Service: Endoscopy;  Laterality: N/A;   ENTEROSCOPY N/A 01/07/2024   Procedure: ENTEROSCOPY;  Surgeon: Eartha Angelia Sieving, MD;  [facility];  Service: Gastroenterology;  Laterality: N/A;  1:45PM;ASA 3   ESOPHAGOGASTRODUODENOSCOPY (EGD) WITH PROPOFOL  N/A 05/24/2022   Procedure: ESOPHAGOGASTRODUODENOSCOPY (EGD) WITH PROPOFOL ;  Surgeon: Shaaron Lamar HERO, MD;  [facility];  Service: Endoscopy;  Laterality: N/A;   ESOPHAGOGASTRODUODENOSCOPY (EGD) WITH PROPOFOL  N/A 01/10/2023   Procedure: ESOPHAGOGASTRODUODENOSCOPY (EGD) WITH PROPOFOL ;  Surgeon: Wilhelmenia Aloha Raddle., MD;  Location: [facility] ENDOS[facility];  Service: Gastroenterology;  Laterality: N/A;   EUS N/A 01/10/2023   Procedure:  UPPER ENDOSCOPIC ULTRASOUND (EUS) RADIAL;  Surgeon: Wilhelmenia Aloha Raddle., MD;  Location: [facility] ENDOSCOPY;  Service: Gastroenterology;  Laterality: N/A;   EXCISIONAL HEMORRHOIDECTOMY     inside and out   EYE SURGERY Left 2020   Cat Sx - Dr. Lelon   FINE NEEDLE ASPIRATION Right    knee; drew ~ 1 quart off   GIVENS CAPSULE STUDY N/A 05/25/2022   Procedure: GIVENS CAPSULE STUDY;  Surgeon: Cindie Carlin POUR, DO;  [facility];  Service: Endoscopy;  Laterality: N/A;   GIVENS CAPSULE STUDY N/A 01/23/2023   Procedure: GIVENS CAPSULE STUDY;  Surgeon: Eartha Angelia Sieving, MD;  [facility];  Service: Gastroenterology;  Laterality: N/A;  8:30am   HERNIA REPAIR     HOT HEMOSTASIS N/A 01/10/2023   Procedure: HOT HEMOSTASIS (ARGON PLASMA COAGULATION/BICAP);  Surgeon: Wilhelmenia Aloha Raddle., MD;  Location: [facility];  Service: Gastroenterology;  Laterality: N/A;   HOT HEMOSTASIS  01/07/2024   Procedure: EGD, WITH ARGON PLASMA COAGULATION;  Surgeon: Eartha Angelia Sieving,  MD;  [facility];  Service: Gastroenterology;;   LAPAROSCOPIC CHOLECYSTECTOMY     LEFT ATRIAL APPENDAGE OCCLUSION N/A 06/28/2022   Procedure: LEFT ATRIAL APPENDAGE OCCLUSION;  Surgeon: Wonda Sharper, MD;  Location: [facility] INVASIVE CV LAB;  Service: Cardiovascular;  Laterality: N/A;   MELANOMA EXCISION Right    neck   NM MYOCAR PERF WALL MOTION  02/21/2012   EF 61% ,EXERCISE 7 METS. exercise stopped due to wheezing and shortness of breathe   POLYPECTOMY  05/16/2017   Procedure: POLYPECTOMY;  Surgeon: Golda Claudis PENNER, MD;  [facility];  Service: Endoscopy;;  colon   POLYPECTOMY  05/24/2022   Procedure: POLYPECTOMY;  Surgeon: Shaaron Lamar HERO, MD;  [facility];  Service: Endoscopy;;   POLYPECTOMY  01/10/2023   Procedure: POLYPECTOMY;  Surgeon: Wilhelmenia Aloha Raddle., MD;  Location: [facility];  Service: Gastroenterology;;   PROSTATECTOMY     RIGHT/LEFT HEART CATH AND  CORONARY ANGIOGRAPHY N/A 01/27/2024   Procedure: RIGHT/LEFT HEART CATH AND CORONARY ANGIOGRAPHY;  Surgeon: Zenaida Morene PARAS, MD;  Location: [facility] INVASIVE CV LAB;  Service: Cardiovascular;  Laterality: N/A;   SHOULDER OPEN ROTATOR CUFF REPAIR Right X 2   SUBMUCOSAL TATTOO INJECTION  05/30/2022   Procedure: SUBMUCOSAL TATTOO INJECTION;  Surgeon: Shaaron Lamar HERO, MD;  [facility];  Service: Endoscopy;;   SUBMUCOSAL TATTOO INJECTION  01/10/2023   Procedure: SUBMUCOSAL TATTOO INJECTION;  Surgeon: Wilhelmenia Aloha Raddle., MD;  Location: [facility];  Service: Gastroenterology;;   TEE WITHOUT CARDIOVERSION N/A 09/05/2015   Procedure: TRANSESOPHAGEAL ECHOCARDIOGRAM (TEE);  Surgeon: Wilbert JONELLE Bihari, MD;  Location: [facility] ENDOSCOPY;  Service: Cardiovascular;  Laterality: N/A;   TEE WITHOUT CARDIOVERSION N/A 06/28/2022   Procedure: TRANSESOPHAGEAL ECHOCARDIOGRAM (TEE);  Surgeon: Wonda Sharper, MD;  Location: [facility] INVASIVE CV LAB;  Service: Cardiovascular;  Laterality: N/A;     reports that he quit smoking about 33 years ago. His smoking use included cigarettes. He started smoking about 60 years ago. He has a 54 pack-year smoking history. He has been exposed to tobacco smoke. He has never used smokeless tobacco. He reports that he does not drink alcohol and does not use drugs.  Allergies  Allergen Reactions   Silicone Dermatitis   Other Dermatitis and Other (See Comments)    Adhesive agent (substance)   Azithromycin  Nausea Only   Tape Rash and Other (See Comments)    Causes skin redness, Use paper tape only.    Family History  Problem Relation Age of Onset   Heart disease Mother    Lung cancer Mother    Heart attack Mother 71   Diabetes Father    Heart disease Father    Stroke Brother    Healthy Daughter    Colon cancer Maternal Aunt        70s    Prior to Admission medications   Medication Sig Start Date End Date Taking? Authorizing Provider  albuterol  (VENTOLIN  HFA) 108 (90 Base)  MCG/ACT inhaler Inhale 2 puffs into the lungs every 6 (six) hours as needed for wheezing or shortness of breath. 05/20/24   [name], MD  ammonium lactate  (AMLACTIN DAILY) 12 % lotion Apply 1 Application topically as needed. 11/22/23   Tobie Franky SQUIBB, DPM  aspirin  81 MG chewable tablet Chew 81 mg by mouth daily.    [provider]  camphor-menthol (ANTI-ITCH) lotion Apply 1 Application topically daily as needed for itching.    [provider]  colchicine  0.6 MG tablet  TAKE 2 TABLETS ON DAY 1, FOLLOWED BY 1 TABLET 1 HOUR LATER. PLEASE TAKE 1 TABLET ONCE DAILY FROM DAY 2 TILL SWELLING IMPROVES. 07/13/24   [name], MD  COMBIGAN  0.2-0.5 % ophthalmic solution Place 1 drop into both eyes 2 (two) times daily. 10/16/21   [provider]  dapagliflozin  propanediol (FARXIGA ) 10 MG TABS tablet Take 1 tablet (10 mg total) by mouth daily. 05/19/24   Miriam Norris, NP  diphenhydramine -acetaminophen  (TYLENOL  PM) 25-500 MG TABS tablet Take 1 tablet by mouth at bedtime.    [provider]  dorzolamide  (TRUSOPT ) 2 % ophthalmic solution 1 drop 2 (two) times daily. 07/09/24   [provider]  ELDERBERRY PO Take 2 tablets by mouth daily. Chewable    [provider]  ezetimibe  (ZETIA ) 10 MG tablet Take 1 tablet (10 mg total) by mouth daily. 08/12/24   Debera Jayson MATSU, MD  ferrous sulfate  325 (65 FE) MG tablet Take 1 tablet (325 mg total) by mouth daily with breakfast. 09/25/22   Lamon Herter M, PA-C  fluticasone  (FLONASE ) 50 MCG/ACT nasal spray Place 1 spray into both nostrils daily. SPRAY 2 SPRAYS INTO EACH NOSTRIL EVERY DAY 05/20/24   [name], MD  gabapentin  (NEURONTIN ) 300 MG capsule Take 1 capsule (300 mg total) by mouth 3 (three) times daily. 09/02/24   Tobie Franky SQUIBB, DPM  gemfibrozil  (LOPID ) 600 MG tablet Take 600 mg by mouth at bedtime.     [provider]  glimepiride  (AMARYL ) 2 MG tablet Take 1 tablet (2 mg total) by mouth daily  with breakfast. 01/28/24   Chatterjee, Srobona Tublu, MD  Insulin  Glargine (BASAGLAR  KWIKPEN) 100 UNIT/ML INJECT 45 UNITS INTO THE SKIN AT BEDTIME. 06/08/24   [name], MD  magnesium  gluconate (MAGONATE) 500 MG tablet Take 500 mg by mouth daily.    [provider]  mineral oil liquid Take 15 mLs by mouth daily as needed for moderate constipation.    [provider]  Omega-3 Fatty Acids (FISH OIL EXTRA STRENGTH) 1200 MG CAPS Take 1 capsule by mouth daily.    [provider]  OVER THE COUNTER MEDICATION Take 1 tablet by mouth daily. Beets Chew    [provider]  pantoprazole  (PROTONIX ) 40 MG tablet TAKE 1 TABLET BY MOUTH EVERY DAY 07/29/24   Carlan, Chelsea L, NP  torsemide  (DEMADEX ) 20 MG tablet 40 MG IN THE MORNING AND 20 MG IN THE EVENING Patient taking differently: Take 20 mg by mouth 2 (two) times daily. May take an extra 2 tabs as needed in the evening 05/19/24   Debera Jayson MATSU, MD  vitamin E 45 MG (100 UNITS) capsule Take 100 Units by mouth daily.    [provider]  zinc  gluconate 50 MG tablet Take 50 mg by mouth daily.    [provider]    Physical Exam: Vitals:   09/19/24 1230 09/19/24 1300 09/19/24 1332 09/19/24 1355  BP: 109/71 136/79 109/65   Pulse: 100 98 (!) 108   Resp: (!) 26 (!) 25 (!) 23   Temp:    98.2 F (36.8 C)  TempSrc:    Oral  SpO2:  100% 100%   Weight:    100.5 kg  Height:    5' 10 (1.778 m)    Constitutional: frail elderly male, somnolent but arousable, NAD, calm, comfortable Eyes: PERRL, lids and conjunctivae normal ENMT: Mucous membranes are dry.  Posterior pharynx clear of any exudate or lesions.  Neck: normal,  supple, no masses, no thyromegaly Respiratory: clear to auscultation bilaterally, no wheezing, no crackles. Normal respiratory effort. No accessory muscle use.  Cardiovascular: normal s1, s2 sounds, no murmurs / rubs / gallops. No extremity edema. 2+ pedal pulses. No carotid bruits.   Abdomen: no tenderness, no masses palpated. No hepatosplenomegaly. Bowel sounds positive.  Musculoskeletal: no clubbing / cyanosis. No joint deformity upper and lower extremities. Good ROM, no contractures. Normal muscle tone.  Skin: age related changes, no rashes, lesions, ulcers. No induration Neurologic: CN 2-12 grossly intact. Sensation intact, DTR normal. Strength 5/5 in all 4.  Psychiatric: unable to assess judgment and insight. somnolent. Flat affect.   Labs on Admission: I have personally reviewed following labs and imaging studies  CBC: Recent Labs  Lab 09/19/24 1128  WBC 17.4*  NEUTROABS 13.7*  HGB 11.9*  HCT 41.0  MCV 82.5  PLT 271   Basic Metabolic Panel: Recent Labs  Lab 09/19/24 1128  NA 137  K 3.6  CL 91*  CO2 23  GLUCOSE 239*  BUN 34*  CREATININE 1.44*  CALCIUM  8.9   GFR: Estimated Creatinine Clearance: 47.8 mL/min (A) (by C-G formula based on SCr of 1.44 mg/dL (H)). Liver Function Tests: Recent Labs  Lab 09/19/24 1128  AST 49*  ALT 14  ALKPHOS 107  BILITOT 2.2*  PROT 7.1  ALBUMIN 3.6   No results for input(s): LIPASE, AMYLASE in the last 168 hours. No results for input(s): AMMONIA in the last 168 hours. Coagulation Profile: No results for input(s): INR, PROTIME in the last 168 hours. Cardiac Enzymes: Recent Labs  Lab 09/19/24 1128  CKTOTAL 518*   BNP (last 3 results) Recent Labs    09/19/24 1128  PROBNP 4,082.0*   HbA1C: No results for input(s): HGBA1C in the last 72 hours. CBG: No results for input(s): GLUCAP in the last 168 hours. Lipid Profile: No results for input(s): CHOL, HDL, LDLCALC, TRIG, CHOLHDL, LDLDIRECT in the last 72 hours. Thyroid  Function Tests: No results for input(s): TSH, T4TOTAL, FREET4, T3FREE, THYROIDAB in the last 72 hours. Anemia Panel: No results for input(s): VITAMINB12, FOLATE, FERRITIN, TIBC, IRON , RETICCTPCT in the last 72 hours. Urine analysis:     Component Value Date/Time   COLORURINE YELLOW 09/19/2024 1124   APPEARANCEUR CLEAR 09/19/2024 1124   APPEARANCEUR Clear 04/09/2024 1623   LABSPEC 1.021 09/19/2024 1124   PHURINE 5.0 09/19/2024 1124   GLUCOSEU >=500 (A) 09/19/2024 1124   HGBUR MODERATE (A) 09/19/2024 1124   BILIRUBINUR NEGATIVE 09/19/2024 1124   BILIRUBINUR Negative 04/09/2024 1623   KETONESUR 5 (A) 09/19/2024 1124   PROTEINUR 30 (A) 09/19/2024 1124   NITRITE NEGATIVE 09/19/2024 1124   LEUKOCYTESUR NEGATIVE 09/19/2024 1124    Radiological Exams on Admission: DG Chest Port 1 View Result Date: 09/19/2024 EXAM: 1 VIEW(S) XRAY OF THE CHEST 09/19/2024 11:45:00 AM COMPARISON: 01/20/2024 CLINICAL HISTORY: shortness of breath FINDINGS: LUNGS AND PLEURA: Probable left retrocardiac opacity concerning for atelectasis or infiltrate. Possible effusion. No pneumothorax. HEART AND MEDIASTINUM: Stable cardiomegaly. No acute abnormality of the mediastinal silhouette. BONES AND SOFT TISSUES: No acute osseous abnormality. IMPRESSION: 1. Left retrocardiac opacity suspicious for atelectasis or infiltrate, with possible effusion. 2. Stable cardiomegaly. Electronically signed by: Lynwood Seip MD 09/19/2024 12:16 PM EST RP Workstation: [id number]   DG Knee Complete 4 Views Right Result Date: 09/19/2024 EXAM: 4 OR MORE VIEW(S) XRAY OF THE RIGHT KNEE 09/19/2024 11:45:00 AM COMPARISON: None available. CLINICAL HISTORY: shortness of breath FINDINGS: BONES AND JOINTS: No acute fracture. No malalignment.  Small suprapatellar joint effusion is noted. Minimal narrowing of lateral joint space is noted. SOFT TISSUES: The soft tissues are unremarkable. IMPRESSION: 1. Small suprapatellar joint effusion. 2. Minimal lateral compartment joint space narrowing. Electronically signed by: Lynwood Seip MD 09/19/2024 12:14 PM EST RP Workstation: [id number]   DG Pelvis 1-2 Views Result Date: 09/19/2024 EXAM: 1 or 2 view(s) Xray of the pelvis 09/19/2024 11:45:00 AM  COMPARISON: None available. CLINICAL HISTORY: Shortness of breath FINDINGS: BONES AND JOINTS: No acute fracture. No malalignment. SOFT TISSUES: The soft tissues are unremarkable. IMPRESSION: 1. No significant abnormality. Electronically signed by: Lynwood Seip MD 09/19/2024 12:13 PM EST RP Workstation: [id number]   EKG: Independently reviewed. Atrial fibrillation  Assessment/Plan Principal Problem:   Atrial fibrillation with RVR (HCC) Active Problems:   Essential hypertension   Hyperlipidemia   Persistent atrial fibrillation (HCC)   OSA on CPAP   Coronary artery disease involving native coronary artery of native heart with angina pectoris   Secondary hypercoagulable state   Statin myopathy   Chronic gastric erosion   Diabetic neuropathy (HCC)   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 06-28-2022   Chronic combined systolic and diastolic CHF (congestive heart failure) (HCC)   CKD stage 3a, GFR 45-59 ml/min (HCC) - baseline Scr 1.2-1.4   Pulmonary hypertension, unspecified (HCC)   Type 2 diabetes mellitus with hyperglycemia, with long-term current use of insulin  (HCC)   Iron  deficiency anemia   Idiopathic chronic gout without tophus   Fall at home   Elevated CK   Knee pain   Leukocytosis   Atrial fibrillation with RVR -- he was started on IV diltiazem  infusion in ED -- admit to stepdown ICU  -- wean off diltiazem  infusion as able  -- he is s/p watchman device as he could not tolerate full anticoagulation -- he previously had controlled rate Afib without AV nodal agent per cardiology notes  Persistent atrial fibrillation  -- he is no longer on antiarrythymic theapy -- his HR had been controlled without AV nodal blockers -- follow for now and decide if he will require them after he recovers from acute illness  Question of aspiration pneumonia Sepsis ruled out  -- could just be atelectasis on CXR -- added procalcitonin -- added incentive spirometry --  IV antibiotics given in ED -- repeat CXR in AM   Generalized weakness Adult failure to thrive  Lumbar radiculopathy with leg weakness -- discussed with daughter symptoms of leg weakness progressing and requested MRI L spine -- if patient can tolerate will try to obtain MRI L spine without contrast   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 -- resume basal and prandial insulin  -- SSI coverage and frequent CBG monitoring ordered -- updated A1c pending  Acute urinary retention -- I/O caths done in ED -- temporary foley cath placement ordered -- tamsulosin 0.4 mg daily   Essential hypertension  -- he is now on IV diltiazem  infusion and BP is controlled   Hyperlipidemia -- he is reportedly intolerent to statins  --  cardiology had started him on zetia  daily  Stage 3a CKD -- stable, follow   OSA on CPAP -- nightly CPAP ordered  Normocytic anemia -- he is followed by hematology for this  -- he is no longer on octreotide  injections  CAD -- recent cath demonstrated 40% proximal to mid RCA stenosis and 60% PDA stenosis managed with medical therapy by cardiology.  -- he is on aspirin  81 mg daily   DVT prophylaxis:  enoxaparin    Code Status: Full   Family Communication:   Disposition Plan: TBD   Consults called:   Admission status: INP  Critical Care Procedure Note Authorized and Performed by: KYM Louder MD  Total Critical Care time:  65 mins Due to a high probability of clinically significant, life threatening deterioration, the patient required my highest level of preparedness to intervene emergently and I personally spent this critical care time directly and personally managing the patient.  This critical care time included obtaining a history; examining the patient, pulse oximetry; ordering and review of studies; arranging urgent treatment with development of a management plan; evaluation of patient's response of treatment; frequent reassessment; and discussions with  other providers.  This critical care time was performed to assess and manage the high probability of imminent and life threatening deterioration that could result in multi-organ failure.  It was exclusive of separately billable procedures and treating other patients and teaching time.    Level of care: Stepdown Afton Louder MD [facility] How to contact the [facility] Attending or Consulting provider 7A - 7P or covering provider during after hours 7P -7A, for this patient?  Check the care team in [facility] and look for a) attending/consulting [facility] provider listed and b) the [facility] team listed Log into [email or web address] and use [place] [facility]'s universal password to access. If you do not have the password, please contact the hospital operator. Locate the [facility] provider you are looking for under [facility] and page to a number that you can be directly reached. If you still have difficulty reaching the provider, please page the [facility] (Director on Call) for the Hospitalists listed on amion for assistance.   If 7PM-7AM, please contact night-coverage [email or web address] Password [email or web address]  09/19/2024, 3:45 PM

## 2024-09-19 NOTE — ED Triage Notes (Signed)
 Pt arrived via [facility]. Pt states that they were on the floor the past 3 days. EMS arrived to pt still laying in the floor. Pt states he isn't sure what happened but thinks his legs just gave out because they've been known to do that. Pt A and O x4. Pt presented covered in urine. He claims that he does a daily dose of his inhaler but didn't get it today. Pt states he's having severe knee pain in his right leg after the fall. Pt admits to SOB and cough present. Afib on monitor with frequent PVCs.

## 2024-09-19 NOTE — ED Notes (Signed)
 Pt placed on three liters [place]  MD notified.

## 2024-09-19 NOTE — ED Provider Notes (Signed)
 [place] EMERGENCY DEPARTMENT AT [facility] Provider Note   CSN: [id number] Arrival date & time: 09/19/24  1051     Patient presents with: Lucas Dudley Lucas Dudley is a 83 y.o. male.    Fall   Patient is an 83 year old male with a history of congestive heart, according to the medical record the patient follows with oncology because of a history of chronic anemia, he follows with cardiology Dr. Debera and has known chronic heart failure, his last echocardiogram was from April of this year and showed that he had an ejection fraction of 55 to 60% with LVH but indeterminate diastolic parameters he had a left and right heart catheterization performed in that same month which showed moderate nonobstructive coronary disease elevated biventricular filling pressures and diastolic dysfunction  He has come to the hospital today at the request of his daughter who last saw him several days ago.  She is on chemotherapy and has not been at the house in the last few days however somebody did bring him food on Thursday night.  He was in his normal state of health.  He did have a fall on Tuesday when he was walking to his chair, he did not come to the hospital at that time.  He fell again on Thursday according to the patient's report and has been on the ground for the last 2 days.  Paramedics found the patient without any obvious signs of trauma except for right knee swelling.  The patient denies hitting his head.  He seems to be making sense, he is short of breath.  He does appear more edematous and has not had his medicines in a couple of days.  He also has a known history of atrial fibrillation  The patient's medication record was also reviewed showing that the patient takes Farxiga , gabapentin , glimepiride , Lantus , torsemide , I do not see any blood thinners on his list    Prior to Admission medications   Medication Sig Start Date End Date Taking? Authorizing Provider  albuterol  (VENTOLIN  HFA)  108 (90 Base) MCG/ACT inhaler Inhale 2 puffs into the lungs every 6 (six) hours as needed for wheezing or shortness of breath. 05/20/24   [name], MD  ammonium lactate  (AMLACTIN DAILY) 12 % lotion Apply 1 Application topically as needed. 11/22/23   Tobie Franky SQUIBB, DPM  aspirin  81 MG chewable tablet Chew 81 mg by mouth daily.    [provider]  camphor-menthol (ANTI-ITCH) lotion Apply 1 Application topically daily as needed for itching.    [provider]  colchicine  0.6 MG tablet TAKE 2 TABLETS ON DAY 1, FOLLOWED BY 1 TABLET 1 HOUR LATER. PLEASE TAKE 1 TABLET ONCE DAILY FROM DAY 2 TILL SWELLING IMPROVES. 07/13/24   [name], MD  COMBIGAN  0.2-0.5 % ophthalmic solution Place 1 drop into both eyes 2 (two) times daily. 10/16/21   [provider]  dapagliflozin  propanediol (FARXIGA ) 10 MG TABS tablet Take 1 tablet (10 mg total) by mouth daily. 05/19/24   Miriam Norris, NP  diphenhydramine -acetaminophen  (TYLENOL  PM) 25-500 MG TABS tablet Take 1 tablet by mouth at bedtime.    [provider]  dorzolamide  (TRUSOPT ) 2 % ophthalmic solution 1 drop 2 (two) times daily. 07/09/24   [provider]  ELDERBERRY PO Take 2 tablets by mouth daily. Chewable    [provider]  ezetimibe  (ZETIA ) 10 MG tablet Take 1 tablet (10 mg total) by mouth daily. 08/12/24   Debera Savant  G, MD  ferrous sulfate  325 (65 FE) MG tablet Take 1 tablet (325 mg total) by mouth daily with breakfast. 09/25/22   Lamon Herter M, PA-C  fluticasone  (FLONASE ) 50 MCG/ACT nasal spray Place 1 spray into both nostrils daily. SPRAY 2 SPRAYS INTO EACH NOSTRIL EVERY DAY 05/20/24   [name], MD  gabapentin  (NEURONTIN ) 300 MG capsule Take 1 capsule (300 mg total) by mouth 3 (three) times daily. 09/02/24   Tobie Franky SQUIBB, DPM  gemfibrozil  (LOPID ) 600 MG tablet Take 600 mg by mouth at bedtime.     [provider]  glimepiride  (AMARYL ) 2 MG tablet Take 1 tablet (2 mg total)  by mouth daily with breakfast. 01/28/24   Dana Ivan Standing, MD  Insulin  Glargine (BASAGLAR  KWIKPEN) 100 UNIT/ML INJECT 45 UNITS INTO THE SKIN AT BEDTIME. 06/08/24   [name], MD  magnesium  gluconate (MAGONATE) 500 MG tablet Take 500 mg by mouth daily.    [provider]  mineral oil liquid Take 15 mLs by mouth daily as needed for moderate constipation.    [provider]  Omega-3 Fatty Acids (FISH OIL EXTRA STRENGTH) 1200 MG CAPS Take 1 capsule by mouth daily.    [provider]  OVER THE COUNTER MEDICATION Take 1 tablet by mouth daily. Beets Chew    [provider]  pantoprazole  (PROTONIX ) 40 MG tablet TAKE 1 TABLET BY MOUTH EVERY DAY 07/29/24   Carlan, Chelsea L, NP  torsemide  (DEMADEX ) 20 MG tablet 40 MG IN THE MORNING AND 20 MG IN THE EVENING Patient taking differently: Take 20 mg by mouth 2 (two) times daily. May take an extra 2 tabs as needed in the evening 05/19/24   Debera Jayson MATSU, MD  vitamin E 45 MG (100 UNITS) capsule Take 100 Units by mouth daily.    [provider]  zinc  gluconate 50 MG tablet Take 50 mg by mouth daily.    [provider]    Allergies: Silicone, Other, Azithromycin , and Tape    Review of Systems  All other systems reviewed and are negative.   Updated Vital Signs BP (!) 145/82   Pulse (!) 105   Temp 97.8 F (36.6 C) (Oral)   Resp (!) 26   Ht 1.778 m (5' 10)   Wt 103.4 kg   SpO2 99%   BMI 32.71 kg/m   Physical Exam Vitals and nursing note reviewed.  Constitutional:      General: He is in acute distress.     Appearance: He is well-developed. He is ill-appearing.  HENT:     Head: Normocephalic and atraumatic.     Mouth/Throat:     Pharynx: No oropharyngeal exudate.  Eyes:     General: No scleral icterus.       Right eye: No discharge.        Left eye: No discharge.     Conjunctiva/sclera: Conjunctivae normal.     Pupils: Pupils are equal, round, and reactive to light.  Neck:      Thyroid : No thyromegaly.     Vascular: No JVD.  Cardiovascular:     Rate and Rhythm: Tachycardia present. Rhythm irregular.     Heart sounds: Normal heart sounds. No murmur heard.    No friction rub. No gallop.  Pulmonary:     Effort: Pulmonary effort is normal. No respiratory distress.     Breath sounds: Normal breath sounds. No wheezing or rales.  Abdominal:     General: Bowel sounds are  normal. There is no distension.     Palpations: Abdomen is soft. There is no mass.     Tenderness: There is no abdominal tenderness.  Musculoskeletal:        General: Swelling, tenderness and signs of injury present.     Cervical back: Normal range of motion and neck supple.     Right lower leg: Edema present.     Left lower leg: Edema present.  Lymphadenopathy:     Cervical: No cervical adenopathy.  Skin:    General: Skin is warm and dry.     Findings: No erythema or rash.  Neurological:     General: No focal deficit present.     Mental Status: He is alert.     Coordination: Coordination normal.  Psychiatric:        Behavior: Behavior normal.     (all labs ordered are listed, but only abnormal results are displayed) Labs Reviewed  CBC WITH DIFFERENTIAL/PLATELET - Abnormal; Notable for the following components:      Result Value   WBC 17.4 (*)    Hemoglobin 11.9 (*)    MCH 23.9 (*)    MCHC 29.0 (*)    RDW 17.9 (*)    Neutro Abs 13.7 (*)    Monocytes Absolute 1.6 (*)    Eosinophils Absolute 1.0 (*)    Abs Immature Granulocytes 0.14 (*)    All other components within normal limits  COMPREHENSIVE METABOLIC PANEL WITH GFR - Abnormal; Notable for the following components:   Chloride 91 (*)    Glucose, Bld 239 (*)    BUN 34 (*)    Creatinine, Ser 1.44 (*)    AST 49 (*)    Total Bilirubin 2.2 (*)    GFR, Estimated 49 (*)    Anion gap 23 (*)    All other components within normal limits  PRO BRAIN NATRIURETIC PEPTIDE - Abnormal; Notable for the following components:   Pro Brain  Natriuretic Peptide 4,082.0 (*)    All other components within normal limits  CK - Abnormal; Notable for the following components:   Total CK 518 (*)    All other components within normal limits  TROPONIN T, HIGH SENSITIVITY - Abnormal; Notable for the following components:   Troponin T High Sensitivity 91 (*)    All other components within normal limits  CULTURE, BLOOD (ROUTINE X 2)  CULTURE, BLOOD (ROUTINE X 2)  URINALYSIS, ROUTINE W REFLEX MICROSCOPIC  BLOOD GAS, VENOUS  LACTIC ACID, PLASMA  LACTIC ACID, PLASMA  TROPONIN T, HIGH SENSITIVITY    EKG: EKG Interpretation Date/Time:  Saturday September 19 2024 11:13:39 EST Ventricular Rate:  136 PR Interval:    QRS Duration:  102 QT Interval:  350 QTC Calculation: 535 R Axis:   -74  Text Interpretation: Atrial fibrillation Ventricular tachycardia, unsustained Incomplete RBBB and LAFB Anterior infarct, old Repolarization abnormality, prob rate related Prolonged QT interval Confirmed by Cleotilde Rogue ([id number]) on 09/19/2024 11:19:27 AM   EKG Interpretation Date/Time:  Saturday September 19 2024 12:22:53 EST Ventricular Rate:  108 PR Interval:    QRS Duration:  129 QT Interval:  415 QTC Calculation: 432 R Axis:   -75  Text Interpretation: Atrial fibrillation Ventricular tachycardia, unsustained RBBB and LAFB Nonspecific T abnormalities, lateral leads non sustained V tach Confirmed by Cleotilde Rogue ([id number]) on 09/19/2024 12:41:00 PM         Radiology: [facility] Chest Port 1 View Result Date: 09/19/2024 EXAM: 1 VIEW(S) XRAY OF THE CHEST 09/19/2024  11:45:00 AM COMPARISON: 01/20/2024 CLINICAL HISTORY: shortness of breath FINDINGS: LUNGS AND PLEURA: Probable left retrocardiac opacity concerning for atelectasis or infiltrate. Possible effusion. No pneumothorax. HEART AND MEDIASTINUM: Stable cardiomegaly. No acute abnormality of the mediastinal silhouette. BONES AND SOFT TISSUES: No acute osseous abnormality. IMPRESSION: 1. Left retrocardiac  opacity suspicious for atelectasis or infiltrate, with possible effusion. 2. Stable cardiomegaly. Electronically signed by: Lynwood Seip MD 09/19/2024 12:16 PM EST RP Workstation: [id number]   DG Knee Complete 4 Views Right Result Date: 09/19/2024 EXAM: 4 OR MORE VIEW(S) XRAY OF THE RIGHT KNEE 09/19/2024 11:45:00 AM COMPARISON: None available. CLINICAL HISTORY: shortness of breath FINDINGS: BONES AND JOINTS: No acute fracture. No malalignment. Small suprapatellar joint effusion is noted. Minimal narrowing of lateral joint space is noted. SOFT TISSUES: The soft tissues are unremarkable. IMPRESSION: 1. Small suprapatellar joint effusion. 2. Minimal lateral compartment joint space narrowing. Electronically signed by: Lynwood Seip MD 09/19/2024 12:14 PM EST RP Workstation: [id number]   DG Pelvis 1-2 Views Result Date: 09/19/2024 EXAM: 1 or 2 view(s) Xray of the pelvis 09/19/2024 11:45:00 AM COMPARISON: None available. CLINICAL HISTORY: Shortness of breath FINDINGS: BONES AND JOINTS: No acute fracture. No malalignment. SOFT TISSUES: The soft tissues are unremarkable. IMPRESSION: 1. No significant abnormality. Electronically signed by: Lynwood Seip MD 09/19/2024 12:13 PM EST RP Workstation: [id number]     .Critical Care  Performed by: Cleotilde Rogue, MD Authorized by: Cleotilde Rogue, MD   Critical care provider statement:    Critical care time (minutes):  75   Critical care time was exclusive of:  Separately billable procedures and treating other patients and teaching time   Critical care was necessary to treat or prevent imminent or life-threatening deterioration of the following conditions:  Metabolic crisis, sepsis and cardiac failure   Critical care was time spent personally by me on the following activities:  Development of treatment plan with patient or surrogate, discussions with consultants, evaluation of patient's response to treatment, examination of patient, obtaining history from patient or  surrogate, review of old charts, re-evaluation of patient's condition, pulse oximetry, ordering and review of radiographic studies, ordering and review of laboratory studies and ordering and performing treatments and interventions   I assumed direction of critical care for this patient from another provider in my specialty: no     Care discussed with: admitting provider   Comments:          Medications Ordered in the ED  0.9 %  sodium chloride  infusion ( Intravenous New Bag/Given 09/19/24 1208)  diltiazem  (CARDIZEM ) 125 mg in dextrose  5% 125 mL (1 mg/mL) infusion (5 mg/hr Intravenous New Bag/Given 09/19/24 1208)  furosemide  (LASIX ) injection 40 mg (has no administration in time range)  cefTRIAXone  (ROCEPHIN ) 2 g in sodium chloride  0.9 % 100 mL IVPB (has no administration in time range)  azithromycin  (ZITHROMAX ) 500 mg in sodium chloride  0.9 % 250 mL IVPB (has no administration in time range)                                    Medical Decision Making Amount and/or Complexity of Data Reviewed Labs: ordered. Radiology: ordered. ECG/medicine tests: ordered.  Risk Prescription drug management. Decision regarding hospitalization.   There is bruising to the right mid abdomen but the patient states this is where he gives himself injections for his diabetes.  He has findings of acute worsening of his shortness of breath, he is edematous, he  has signs of trauma to his right knee and he is in atrial fibrillation with RVR.  I suspect he will need to be admitted to the hospital for what appears to be congestive heart failure and atrial fibrillation, may have trauma as well.  The patient is agreeable to the workup   This patient presents to the ED for concern of shortness of breath, weakness, A-fib, this involves an extensive number of treatment options, and is a complaint that carries with it a high risk of complications and morbidity.  The differential diagnosis includes dehydration, medication  noncompliance secondary to a fall, generalized weakness   Co morbidities / Chronic conditions that complicate the patient evaluation  Chronic CHF   Additional history obtained:  Additional history obtained from EMR External records from outside source obtained and reviewed including record including heart echocardiogram and heart catheterization   Lab Tests:  I Ordered, and personally interpreted labs.  The pertinent results include: White blood cell 17,400, there is a neutrophil predominance at 13,700, CMP shows a creatinine of 1.44 which is at her baseline.  Metabolic panel shows that her electrolytes are unremarkable except for a slight hypochloremia, mildly hyperglycemic and an anion acidosis of 23, BNP is 4000, troponin is 91, CK is 518   Imaging Studies ordered:  I ordered imaging studies including chest x-ray knee x-ray and pelvis x-ray I independently visualized and interpreted imaging which showed apparent infiltrate at the left base I agree with the radiologist interpretation   Cardiac Monitoring: / EKG:  The patient was maintained on a cardiac monitor.  I personally viewed and interpreted the cardiac monitored which showed an underlying rhythm of: Atrial fibrillation   Problem List / ED Course / Critical interventions / Medication management  The patient is critically ill with multiple abnormalities but most importantly he appears to be septic from a pneumonia, he has a leukocytosis he is tachycardic, he is requiring oxygen and is in atrial fibrillation.  A small amount of Cardizem  was started, IV fluids were given, there was no significant hypotension, lactate pending, cultures ordered, antibiotics ordered I ordered medication including diltiazem  drip, Rocephin , Zithromax , IV fluids, pending lactic acid at the time of consultation with hospitalist Reevaluation of the patient after these medicines showed that the patient appears critically ill but slightly improving with  heart rate I have reviewed the patients home medicines and have made adjustments as needed Vitals:   09/19/24 1145 09/19/24 1215 09/19/24 1220 09/19/24 1230  BP: 131/70 (!) 145/82  109/71  Pulse: 98 (!) 108 (!) 105 100  Resp: (!) 29 (!) 25 (!) 26 (!) 26  Temp:      TempSrc:      SpO2: 91% 100% 99%   Weight:      Height:          Consultations Obtained:  I requested consultation with the hospitalist,  and discussed lab and imaging findings as well as pertinent plan - they recommend: Admission   Social Determinants of Health:  Elderly   Test / Admission - Considered:  The patient has had progressive weakness, he presents today with multiple life-threatening problems including uncontrolled arrhythmia, sepsis and acidosis likely secondary to the sepsis      Final diagnoses:  Sepsis, due to unspecified organism, unspecified whether acute organ dysfunction present [facility])  Atrial fibrillation with rapid ventricular response (HCC)  Metabolic acidosis    ED Discharge Orders     None  Cleotilde Rogue, MD 09/19/24 1250

## 2024-09-19 NOTE — Plan of Care (Signed)

## 2024-09-19 NOTE — Hospital Course (Addendum)
 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  He apparently had a fall about 4 days ago at home.  He did not seek medical treatment at the time.  He fell again 2 days ago and was not able to get up and had been on the floor for about 1-2 days.  He has been having difficulty for past few months with lower extremity weakness and his legs giving out.  They have not been able to determine the cause of this.  His daughter called EMS to check on him because she had not heard from him in a couple of days.  EMS found him to have right knee swelling.  He complained of shortness of breath and knee pain.  He has not had his medications in a couple of days.  The ED found him to be tachycardic in A-fib with RVR and was hypoxic and placed on 3 L nasal cannula.  His chest x-ray was suspicious for atelectasis and/or pneumonia.  He was started on IV diltiazem  infusion, IV antibiotics, Lasix , IV fluids.  He was noted to be tachypneic and had normal venous pH.  His blood sugar was 239.  His CK was 518.  Creatinine 1.44, GFR 48.5 mL/min and troponin 91.  White blood cell count 17.4, hemoglobin 11.9, platelets 271.  Bilirubin 2.2.  His trauma workup revealed normal pelvis imaging, right knee imaging reveals small suprapatellar joint effusion.  Admission requested for further management.

## 2024-09-20 ENCOUNTER — Inpatient Hospital Stay (HOSPITAL_COMMUNITY)

## 2024-09-20 ENCOUNTER — Encounter (HOSPITAL_COMMUNITY): Payer: Self-pay | Admitting: Family Medicine

## 2024-09-20 DIAGNOSIS — W19XXXD Unspecified fall, subsequent encounter: Secondary | ICD-10-CM

## 2024-09-20 DIAGNOSIS — I4891 Unspecified atrial fibrillation: Secondary | ICD-10-CM | POA: Diagnosis not present

## 2024-09-20 DIAGNOSIS — D72829 Elevated white blood cell count, unspecified: Secondary | ICD-10-CM

## 2024-09-20 DIAGNOSIS — M5416 Radiculopathy, lumbar region: Secondary | ICD-10-CM | POA: Diagnosis present

## 2024-09-20 DIAGNOSIS — Y92009 Unspecified place in unspecified non-institutional (private) residence as the place of occurrence of the external cause: Secondary | ICD-10-CM

## 2024-09-20 DIAGNOSIS — I10 Essential (primary) hypertension: Secondary | ICD-10-CM

## 2024-09-20 DIAGNOSIS — R748 Abnormal levels of other serum enzymes: Secondary | ICD-10-CM | POA: Diagnosis not present

## 2024-09-20 DIAGNOSIS — Z95818 Presence of other cardiac implants and grafts: Secondary | ICD-10-CM | POA: Diagnosis not present

## 2024-09-20 LAB — CBC WITH DIFFERENTIAL/PLATELET
Abs Immature Granulocytes: 0.06 K/uL (ref 0.00–0.07)
Basophils Absolute: 0 K/uL (ref 0.0–0.1)
Basophils Relative: 0 %
Eosinophils Absolute: 0.1 K/uL (ref 0.0–0.5)
Eosinophils Relative: 1 %
HCT: 35.1 % — ABNORMAL LOW (ref 39.0–52.0)
Hemoglobin: 10.4 g/dL — ABNORMAL LOW (ref 13.0–17.0)
Immature Granulocytes: 1 %
Lymphocytes Relative: 7 %
Lymphs Abs: 0.9 K/uL (ref 0.7–4.0)
MCH: 24.1 pg — ABNORMAL LOW (ref 26.0–34.0)
MCHC: 29.6 g/dL — ABNORMAL LOW (ref 30.0–36.0)
MCV: 81.3 fL (ref 80.0–100.0)
Monocytes Absolute: 1.1 K/uL — ABNORMAL HIGH (ref 0.1–1.0)
Monocytes Relative: 9 %
Neutro Abs: 10.3 K/uL — ABNORMAL HIGH (ref 1.7–7.7)
Neutrophils Relative %: 82 %
Platelets: 229 K/uL (ref 150–400)
RBC: 4.32 MIL/uL (ref 4.22–5.81)
RDW: 17.7 % — ABNORMAL HIGH (ref 11.5–15.5)
WBC: 12.4 K/uL — ABNORMAL HIGH (ref 4.0–10.5)
nRBC: 0 % (ref 0.0–0.2)

## 2024-09-20 LAB — COMPREHENSIVE METABOLIC PANEL WITH GFR
ALT: 13 U/L (ref 0–44)
AST: 28 U/L (ref 15–41)
Albumin: 3 g/dL — ABNORMAL LOW (ref 3.5–5.0)
Alkaline Phosphatase: 83 U/L (ref 38–126)
Anion gap: 15 (ref 5–15)
BUN: 38 mg/dL — ABNORMAL HIGH (ref 8–23)
CO2: 28 mmol/L (ref 22–32)
Calcium: 8.3 mg/dL — ABNORMAL LOW (ref 8.9–10.3)
Chloride: 95 mmol/L — ABNORMAL LOW (ref 98–111)
Creatinine, Ser: 1.25 mg/dL — ABNORMAL HIGH (ref 0.61–1.24)
GFR, Estimated: 58 mL/min — ABNORMAL LOW (ref >60)
Glucose, Bld: 137 mg/dL — ABNORMAL HIGH (ref 70–99)
Potassium: 2.7 mmol/L — CL (ref 3.5–5.1)
Sodium: 138 mmol/L (ref 135–145)
Total Bilirubin: 1 mg/dL (ref 0.0–1.2)
Total Protein: 5.9 g/dL — ABNORMAL LOW (ref 6.5–8.1)

## 2024-09-20 LAB — GLUCOSE, CAPILLARY
Glucose-Capillary: 150 mg/dL — ABNORMAL HIGH (ref 70–99)
Glucose-Capillary: 140 mg/dL — ABNORMAL HIGH (ref 70–99)
Glucose-Capillary: 141 mg/dL — ABNORMAL HIGH (ref 70–99)
Glucose-Capillary: 117 mg/dL — ABNORMAL HIGH (ref 70–99)
Glucose-Capillary: 166 mg/dL — ABNORMAL HIGH (ref 70–99)

## 2024-09-20 LAB — MAGNESIUM: Magnesium: 2.4 mg/dL (ref 1.7–2.4)

## 2024-09-20 LAB — MRSA NEXT GEN BY PCR, NASAL: MRSA by PCR Next Gen: NOT DETECTED

## 2024-09-20 LAB — CK: Total CK: 281 U/L (ref 49–397)

## 2024-09-20 MED ORDER — PNEUMOCOCCAL 20-VAL CONJ VACC 0.5 ML IM SUSY
0.5000 mL | PREFILLED_SYRINGE | INTRAMUSCULAR | Status: AC | PRN
  Administered 2024-09-23: 0.5 mL via INTRAMUSCULAR
  Filled 2024-09-20: qty 0.5

## 2024-09-20 MED ORDER — SODIUM CHLORIDE 0.9 % IV SOLN
100.0000 mg | Freq: Two times a day (BID) | INTRAVENOUS | Status: DC
  Administered 2024-09-20 (×2): 100 mg via INTRAVENOUS
  Filled 2024-09-20 (×5): qty 100

## 2024-09-20 MED ORDER — SODIUM CHLORIDE 0.9 % IV SOLN: INTRAVENOUS | Status: AC | PRN

## 2024-09-20 MED ORDER — GABAPENTIN 300 MG PO CAPS
300.0000 mg | ORAL_CAPSULE | Freq: Three times a day (TID) | ORAL | Status: DC
  Administered 2024-09-21 – 2024-09-23 (×8): 300 mg via ORAL
  Filled 2024-09-20 (×9): qty 1

## 2024-09-20 MED ORDER — POTASSIUM CHLORIDE CRYS ER 20 MEQ PO TBCR
40.0000 meq | EXTENDED_RELEASE_TABLET | Freq: Once | ORAL | Status: AC
  Administered 2024-09-20: 40 meq via ORAL
  Filled 2024-09-20: qty 2

## 2024-09-20 MED ORDER — POTASSIUM CHLORIDE 10 MEQ/100ML IV SOLN
10.0000 meq | INTRAVENOUS | Status: AC
  Administered 2024-09-20 (×4): 10 meq via INTRAVENOUS
  Filled 2024-09-20 (×4): qty 100

## 2024-09-20 MED ORDER — ORAL CARE MOUTH RINSE: 15.0000 mL | OROMUCOSAL | Status: DC | PRN

## 2024-09-20 MED ORDER — DILTIAZEM HCL-DEXTROSE 125-5 MG/125ML-% IV SOLN (PREMIX): 5.0000 mg/h | INTRAVENOUS | Status: AC

## 2024-09-20 MED ORDER — INFLUENZA VAC SPLIT HIGH-DOSE 0.5 ML IM SUSY
0.5000 mL | PREFILLED_SYRINGE | INTRAMUSCULAR | Status: AC | PRN
  Administered 2024-09-23: 0.5 mL via INTRAMUSCULAR
  Filled 2024-09-20: qty 0.5

## 2024-09-20 MED ORDER — DILTIAZEM HCL 30 MG PO TABS
30.0000 mg | ORAL_TABLET | Freq: Four times a day (QID) | ORAL | Status: AC
  Administered 2024-09-20 – 2024-09-25 (×21): 30 mg via ORAL
  Filled 2024-09-20 (×21): qty 1

## 2024-09-20 NOTE — Plan of Care (Signed)
 This patient remains on AP-ICCU as of time of writing. The patient is spontaneously AA+Ox4. The patient is requiring supplemental O2 via [place] at 5 L / min. The patient remains in atrial fibrillation per my measurement of telemetry. The patient is exhibiting poor motivation to participate with ADLs, so physical activity is encouraged by this RN and integrated into daily tasks as possible. The patient's admission profile is completed overnight by this RN. Standing orders for flu and pneumococcal vaccines, Fall prevention measures, and oral care protocol are initiated overnight by this RN.    Problem: Education: Goal: Knowledge of General Education information will improve Description: Including pain rating scale, medication(s)/side effects and non-pharmacologic comfort measures Outcome: Progressing   Problem: Health Behavior/Discharge Planning: Goal: Ability to manage health-related needs will improve Outcome: Progressing   Problem: Clinical Measurements: Goal: Ability to maintain clinical measurements within normal limits will improve Outcome: Progressing Goal: Will remain free from infection Outcome: Progressing Goal: Diagnostic test results will improve Outcome: Progressing Goal: Respiratory complications will improve Outcome: Progressing Goal: Cardiovascular complication will be avoided Outcome: Progressing   Problem: Activity: Goal: Risk for activity intolerance will decrease Outcome: Progressing   Problem: Nutrition: Goal: Adequate nutrition will be maintained Outcome: Progressing   Problem: Coping: Goal: Level of anxiety will decrease Outcome: Progressing   Problem: Elimination: Goal: Will not experience complications related to bowel motility Outcome: Progressing Goal: Will not experience complications related to urinary retention Outcome: Progressing   Problem: Pain Managment: Goal: General experience of comfort will improve and/or be controlled Outcome: Progressing    Problem: Safety: Goal: Ability to remain free from injury will improve Outcome: Progressing   Problem: Skin Integrity: Goal: Risk for impaired skin integrity will decrease Outcome: Progressing   Problem: Education: Goal: Ability to describe self-care measures that may prevent or decrease complications (Diabetes Survival Skills Education) will improve Outcome: Progressing Goal: Individualized Educational Video(s) Outcome: Progressing   Problem: Coping: Goal: Ability to adjust to condition or change in health will improve Outcome: Progressing   Problem: Fluid Volume: Goal: Ability to maintain a balanced intake and output will improve Outcome: Progressing   Problem: Health Behavior/Discharge Planning: Goal: Ability to identify and utilize available resources and services will improve Outcome: Progressing Goal: Ability to manage health-related needs will improve Outcome: Progressing   Problem: Metabolic: Goal: Ability to maintain appropriate glucose levels will improve Outcome: Progressing   Problem: Nutritional: Goal: Maintenance of adequate nutrition will improve Outcome: Progressing Goal: Progress toward achieving an optimal weight will improve Outcome: Progressing   Problem: Skin Integrity: Goal: Risk for impaired skin integrity will decrease Outcome: Progressing   Problem: Tissue Perfusion: Goal: Adequacy of tissue perfusion will improve Outcome: Progressing   Problem: Education: Goal: Ability to demonstrate management of disease process will improve Outcome: Progressing Goal: Ability to verbalize understanding of medication therapies will improve Outcome: Progressing Goal: Individualized Educational Video(s) Outcome: Progressing   Problem: Activity: Goal: Capacity to carry out activities will improve Outcome: Progressing   Problem: Cardiac: Goal: Ability to achieve and maintain adequate cardiopulmonary perfusion will improve Outcome: Progressing    Problem: Education: Goal: Knowledge of disease or condition will improve Outcome: Progressing Goal: Understanding of medication regimen will improve Outcome: Progressing Goal: Individualized Educational Video(s) Outcome: Progressing   Problem: Activity: Goal: Ability to tolerate increased activity will improve Outcome: Progressing   Problem: Cardiac: Goal: Ability to achieve and maintain adequate cardiopulmonary perfusion will improve Outcome: Progressing   Problem: Health Behavior/Discharge Planning: Goal: Ability to safely manage health-related needs after  discharge will improve Outcome: Progressing   Problem: Education: Goal: Knowledge of disease and its progression will improve Outcome: Progressing Goal: Individualized Educational Video(s) Outcome: Progressing   Problem: Fluid Volume: Goal: Compliance with measures to maintain balanced fluid volume will improve Outcome: Progressing   Problem: Health Behavior/Discharge Planning: Goal: Ability to manage health-related needs will improve Outcome: Progressing   Problem: Nutritional: Goal: Ability to make healthy dietary choices will improve Outcome: Progressing   Problem: Clinical Measurements: Goal: Complications related to the disease process, condition or treatment will be avoided or minimized Outcome: Progressing

## 2024-09-20 NOTE — Progress Notes (Signed)
 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  He apparently had a fall about 4 days ago at home.  He did not seek medical treatment at the time.  He fell again 2 days ago and was not able to get up and had been on the floor for about 1-2 days.  He has been having difficulty for past few months with lower extremity weakness and his legs giving out.  They have not been able to determine the cause of this.  His daughter called EMS to check on him because she had not heard from him in a couple of days.  EMS found him to have right knee swelling.  He complained of shortness of breath and knee pain.  He has not had his medications in a couple of days.  The ED found him to be tachycardic in A-fib with RVR and was hypoxic and placed on 3 L nasal cannula.  His chest x-ray was suspicious for atelectasis and/or pneumonia.  He was started on IV diltiazem  infusion, IV antibiotics, Lasix , IV fluids.  He was noted to be tachypneic and had normal venous pH.  His blood sugar was 239.  His CK was 518.  Creatinine 1.44, GFR 48.5 mL/min and troponin 91.  White blood cell count 17.4, hemoglobin 11.9, platelets 271.  Bilirubin 2.2.  His trauma workup revealed normal pelvis imaging, right knee imaging reveals small suprapatellar joint effusion.  Admission requested for further management.   Assessment and Plan:  Atrial fibrillation with RVR -- he was started on IV diltiazem  infusion in ED --  admitted to stepdown ICU  -- wean off diltiazem  infusion today to oral Cardizem  30 mg every 6 hours  -- he is s/p watchman device as he could not tolerate full anticoagulation -- he previously had controlled rate Afib without AV nodal agent per cardiology notes   Persistent atrial fibrillation  -- he is no longer on antiarrythymic therapy -- his HR had been controlled without AV nodal blockers -- follow for now and decide if he will require them after he recovers from acute illness   Presumed aspiration pneumonia Sepsis ruled out  -- could just be atelectasis on CXR but given he was down on floor for prolonged time he likely did aspirate  -- added incentive spirometry -- IV antibiotics  -- repeat CXR showing improvement    Generalized weakness Adult failure to thrive  Lumbar radiculopathy with leg weakness -- discussed with daughter symptoms of leg weakness progressing and requested MRI L spine -- if patient can tolerate will try to obtain MRI L spine without contrast  -- MRI with findings of 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  Will obtain PT evaluation.  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 -- resume basal and prandial insulin  -- SSI coverage and frequent CBG monitoring ordered -- updated A1c 11.1%  CBG (last 3)  Recent Labs    09/20/24 0303 09/20/24 0741 09/20/24 1120  GLUCAP 150* 140* 141*  Acute urinary retention -- I/O caths done in ED -- temporary foley cath placement ordered -- tamsulosin 0.4 mg daily  -- plan to remove foley on 12/8 and do a voiding trial    Essential hypertension  -- wean off IV diltiazem  infusion to oral diltiazem  30 mg every 6 hours     Hyperlipidemia -- he is reportedly intolerent to statins  --  cardiology had started him on zetia  daily previously but he now says he doesn't take it    Stage 3a CKD -- stable, follow    OSA on CPAP -- nightly CPAP ordered   Normocytic  anemia -- he is followed by hematology for this  -- he is no longer on octreotide  injections   CAD -- recent cath demonstrated 40% proximal to mid RCA stenosis and 60% PDA stenosis managed with medical therapy by cardiology.  -- he is on aspirin  81 mg daily    DVT prophylaxis: enoxaparin   Code Status: Full  Family Communication: daughter on phone 12/6  Disposition: anticipate SNF rehab    Consultants:   Procedures:   Antimicrobials:  Anti-infectives (From admission, onward)    Start     Dose/Rate Route Frequency Ordered Stop   09/20/24 1300  cefTRIAXone  (ROCEPHIN ) 2 g in sodium chloride  0.9 % 100 mL IVPB        2 g 200 mL/hr over 30 Minutes Intravenous Every 24 hours 09/19/24 1805 09/24/24 1259   09/20/24 1100  doxycycline  (VIBRAMYCIN ) 100 mg in sodium chloride  0.9 % 250 mL IVPB        100 mg 125 mL/hr over 120 Minutes Intravenous Every 12 hours 09/20/24 0918     09/19/24 1245  cefTRIAXone  (ROCEPHIN ) 2 g in sodium chloride  0.9 % 100 mL IVPB        2 g 200 mL/hr over 30 Minutes Intravenous  Once 09/19/24 1231 09/19/24 1342   09/19/24 1245  azithromycin  (ZITHROMAX ) 500 mg in sodium chloride  0.9 % 250 mL IVPB  Status:  Discontinued        500 mg 250 mL/hr over 60 Minutes Intravenous Every 24 hours 09/19/24 1231 09/20/24 0918        Subjective: Pt says he has been having weakness in his right leg for quite some time that is causing him to fall frequently.    Objective: Vitals:   09/20/24 0500 09/20/24 0527 09/20/24 0600 09/20/24 0746  BP: 127/81  125/73   Pulse: (!) 57 (!) 48 87   Resp: (!) 22 (!) 22 19   Temp:  97.8 F (36.6 C)  98 F (36.7 C)  TempSrc:  Oral  Oral  SpO2: 96% 98% 100%   Weight:      Height:        Intake/Output Summary (Last 24 hours) at 09/20/2024 1307 Last data filed at 09/20/2024 0818 Gross per 24 hour  Intake 3329.35 ml  Output 1580 ml  Net 1749.35 ml   Filed Weights   09/19/24 1118 09/19/24 1355  Weight: 103.4 kg 100.5 kg    Examination:  General exam: Appears calm and comfortable  Respiratory system: Clear to auscultation. Respiratory effort normal. Cardiovascular system: normal S1 & S2 heard. No JVD, murmurs, rubs, gallops or clicks. No pedal edema. Gastrointestinal system: Abdomen is nondistended, soft and nontender. No organomegaly or masses felt. Normal bowel sounds heard. Central nervous system: Alert and oriented. No focal neurological deficits. Extremities: Symmetric 5 x 5 power. Skin: No rashes, lesions or ulcers. Psychiatry: Judgement and insight appear normal.  Mood & affect appropriate.   Data Reviewed: I have personally reviewed following labs and imaging studies  CBC: Recent Labs  Lab 09/19/24 1128 09/20/24 0503  WBC 17.4* 12.4*  NEUTROABS 13.7* 10.3*  HGB 11.9* 10.4*  HCT 41.0 35.1*  MCV 82.5 81.3  PLT 271 229    Basic Metabolic Panel: Recent Labs  Lab 09/19/24 1128 09/20/24 0503  NA 137 138  K 3.6 2.7*  CL 91* 95*  CO2 23 28  GLUCOSE 239* 137*  BUN 34* 38*  CREATININE 1.44* 1.25*  CALCIUM  8.9 8.3*  MG  --  2.4    CBG: Recent Labs  Lab 09/19/24 1645 09/19/24 2106 09/20/24 0303 09/20/24 0741 09/20/24 1120  GLUCAP 317* 234* 150* 140* 141*    Recent Results (from the past 240 hours)  Blood culture (routine x 2)     Status: None (Preliminary result)   Collection Time: 09/19/24 12:51 PM   Specimen: BLOOD RIGHT ARM  Result Value Ref Range Status   Specimen Description BLOOD RIGHT ARM  Final   Special Requests Blood Culture adequate volume  Final   Culture   Final    NO GROWTH < 24 HOURS Performed at [facility], [address], [place], [place] [address]    Report Status PENDING  Incomplete  Blood culture (routine x 2)     Status: None (Preliminary result)   Collection Time: 09/19/24 12:56 PM   Specimen: Left Antecubital; Blood  Result Value Ref Range Status   Specimen Description LEFT ANTECUBITAL  Final   Special Requests Blood Culture adequate volume   Final   Culture   Final    NO GROWTH < 24 HOURS Performed at [facility], [address] [place], [place] [address]    Report Status PENDING  Incomplete  MRSA Next Gen by PCR, Nasal     Status: None   Collection Time: 09/19/24  2:05 PM   Specimen: Nasal Mucosa; Nasal Swab  Result Value Ref Range Status   MRSA by PCR Next Gen NOT DETECTED NOT DETECTED Final    Comment: (NOTE) The GeneXpert MRSA Assay (FDA approved for NASAL specimens only), is one component of a comprehensive MRSA colonization surveillance program. It is not intended to diagnose MRSA infection nor to guide or monitor treatment for MRSA infections. Test performance is not FDA approved in patients less than 76 years old. Performed at [facility], [address] [place], [place] [address]      Radiology Studies: DG CHEST PORT 1 VIEW Result Date: 09/20/2024 EXAM: 1 VIEW(S) XRAY OF THE CHEST 09/20/2024 05:29:00 AM COMPARISON: 09/19/2024 CLINICAL HISTORY: 91209 Atelectasis 91209; 10031 Cough 10031 FINDINGS: LUNGS AND PLEURA: Low lung volumes. Possible left retrocardiac opacity, favored to reflect atelectasis, but difficult to exclude superimposed airspace opacity. Decreased left pleural effusion. Trace right pleural effusion. No pneumothorax. HEART AND MEDIASTINUM: Cardiomegaly. Atherosclerotic calcifications. BONES AND SOFT TISSUES: No acute osseous abnormality. IMPRESSION: 1. Decreased left pleural effusion and trace right pleural effusion. 2. Possible left retrocardiac opacity, favored to reflect atelectasis, but difficult to exclude superimposed airspace opacity. 3. Cardiomegaly and atherosclerotic calcifications. Electronically signed by: Evalene Coho MD 09/20/2024 06:05 AM EST RP Workstation: [id number]   MR LUMBAR SPINE WO CONTRAST Result Date: 09/19/2024 EXAM: MRI LUMBAR SPINE 09/19/2024 04:34:40 PM TECHNIQUE: Multiplanar multisequence MRI of the lumbar spine was performed without the administration of intravenous  contrast. COMPARISON: 04/08/2020 CLINICAL HISTORY: Lumbar radiculopathy, symptoms persist with > 6 wks treatment; Myelopathy, acute, lumbar spine. FINDINGS: BONES AND  ALIGNMENT: Normal alignment except for slight retrolisthesis at L2-L3 and L3-L4, which is stable. Normal vertebral body heights. Bone marrow signal is unremarkable except for extensive type 2 Modic changes at L2-L3 and L3-L4, and edematous changes that have progressed at L4-L5 and L5-S1. SPINAL CORD: The conus medullaris terminates at L1. SOFT TISSUES: No paraspinal mass. L1-L2: No significant disc herniation. No spinal canal stenosis or neural foraminal narrowing. L2-L3: Uncovertebral broad-based disc protrusion results in stable mild foraminal narrowing bilaterally. A broad-based disc protrusion and prominent epidural fat results in moderate central stenosis. Severe left and moderate right foraminal narrowing demonstrate some progression since the prior study. L3-L4: Broad-based disc protrusion has progressed. Moderate facet hypertrophy has progressed bilaterally. 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 Progressive moderate foraminal stenosis is present bilaterally, right greater than left. L4-L5: Chronic positive disc height is present. Endplate ridging and epidural lipomatosis result in moderate central canal stenosis. Moderate-to-severe right and moderate left foraminal stenosis is stable. L5-S1: Edematous changes have progressed. No significant disc herniation. No spinal canal stenosis or neural foraminal narrowing. IMPRESSION: 1. 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 2. Moderate central canal stenosis at L2-3 from broad-based disc protrusion and prominent epidural fat. 3. Progressive moderate foraminal stenosis bilaterally at L4-5, right greater than left. 4. Severe left and moderate right foraminal narrowing at  L2-3 with some progression since prior study. 5. Progression of edematous changes at L4-5 and L5-S1. 6. Progression of broad-based disc protrusion and moderate facet hypertrophy bilaterally at L4-5. Electronically signed by: Lonni Necessary MD 09/19/2024 06:28 PM EST RP Workstation: [id number]   DG Chest Port 1 View Result Date: 09/19/2024 EXAM: 1 VIEW(S) XRAY OF THE CHEST 09/19/2024 11:45:00 AM COMPARISON: 01/20/2024 CLINICAL HISTORY: shortness of breath FINDINGS: LUNGS AND PLEURA: Probable left retrocardiac opacity concerning for atelectasis or infiltrate. Possible effusion. No pneumothorax. HEART AND MEDIASTINUM: Stable cardiomegaly. No acute abnormality of the mediastinal silhouette. BONES AND SOFT TISSUES: No acute osseous abnormality. IMPRESSION: 1. Left retrocardiac opacity suspicious for atelectasis or infiltrate, with possible effusion. 2. Stable cardiomegaly. Electronically signed by: Lynwood Seip MD 09/19/2024 12:16 PM EST RP Workstation: [id number]   DG Knee Complete 4 Views Right Result Date: 09/19/2024 EXAM: 4 OR MORE VIEW(S) XRAY OF THE RIGHT KNEE 09/19/2024 11:45:00 AM COMPARISON: None available. CLINICAL HISTORY: shortness of breath FINDINGS: BONES AND JOINTS: No acute fracture. No malalignment. Small suprapatellar joint effusion is noted. Minimal narrowing of lateral joint space is noted. SOFT TISSUES: The soft tissues are unremarkable. IMPRESSION: 1. Small suprapatellar joint effusion. 2. Minimal lateral compartment joint space narrowing. Electronically signed by: Lynwood Seip MD 09/19/2024 12:14 PM EST RP Workstation: [id number]   DG Pelvis 1-2 Views Result Date: 09/19/2024 EXAM: 1 or 2 view(s) Xray of the pelvis 09/19/2024 11:45:00 AM COMPARISON: None available. CLINICAL HISTORY: Shortness of breath FINDINGS: BONES AND JOINTS: No acute fracture. No malalignment. SOFT TISSUES: The soft tissues are unremarkable. IMPRESSION: 1. No significant abnormality. Electronically signed by: Lynwood Seip MD 09/19/2024 12:13 PM EST RP Workstation: [id number]    Scheduled Meds:  acetaminophen   650 mg Oral Q6H   Or   acetaminophen   650 mg Rectal Q6H   aspirin   81 mg Oral Daily   brimonidine   1 drop Both Eyes BID   And   timolol   1 drop Both Eyes BID   Chlorhexidine  Gluconate Cloth  6 each Topical Daily   dapagliflozin  propanediol  10 mg Oral  Daily   diltiazem   30 mg Oral Q6H   dorzolamide   1 drop Both Eyes BID   enoxaparin  (LOVENOX ) injection  40 mg Subcutaneous Q24H   ferrous sulfate   325 mg Oral Q breakfast   gabapentin   300 mg Oral TID   insulin  aspart  0-9 Units Subcutaneous TID WC   insulin  aspart  10 Units Subcutaneous TID WC   insulin  glargine-yfgn  35 Units Subcutaneous QHS   pantoprazole   40 mg Oral QPM   Continuous Infusions:  cefTRIAXone  (ROCEPHIN )  IV 2 g (09/20/24 1302)   diltiazem  (CARDIZEM ) infusion     doxycycline  (VIBRAMYCIN ) IV 100 mg (09/20/24 1147)     LOS: 1 day   Critical Care Procedure Note Authorized and Performed by: KYM Louder MD  Total Critical Care time:  56 mins Due to a high probability of clinically significant, life threatening deterioration, the patient required my highest level of preparedness to intervene emergently and I personally spent this critical care time directly and personally managing the patient.  This critical care time included obtaining a history; examining the patient, pulse oximetry; ordering and review of studies; arranging urgent treatment with development of a management plan; evaluation of patient's response of treatment; frequent reassessment; and discussions with other providers.  This critical care time was performed to assess and manage the high probability of imminent and life threatening deterioration that could result in multi-organ failure.  It was exclusive of separately billable procedures and treating other patients and teaching time.   Afton Louder, MD How to contact the TRH Attending or Consulting provider  7A - 7P or covering provider during after hours 7P -7A, for this patient?  Check the care team in [facility] and look for a) attending/consulting TRH provider listed and b) the TRH team listed Log into [email or web address] to find provider on call.  Locate the TRH provider you are looking for under [facility] and page to a number that you can be directly reached. If you still have difficulty reaching the provider, please page the [facility] (Director on Call) for the Hospitalists listed on amion for assistance.  09/20/2024, 1:07 PM

## 2024-09-20 NOTE — TOC Initial Note (Signed)
 Transition of Care [facility]) - Initial/Assessment Note    Patient Details  Name: Lucas Dudley MRN: [id number] Date of Birth: 02-02-1941  Transition of Care [facility]) CM/SW Contact:    Nancee Powell BIRCH, LCSW Phone Number: 09/20/2024, 3:15 PM  Clinical Narrative:                 Patient considered high risk for readmission. Patient from home alone. Independent at baseline. Uses a cane when he needs to. Still drives. Has a daughter who lives 45 minutes away that can assist if needed. Agreeable to HHPT services.   Expected Discharge Plan: Home w Home Health Services Barriers to Discharge: Continued Medical Work up   Patient Goals and CMS Choice Patient states their goals for this hospitalization and ongoing recovery are:: return home with home health services   Choice offered to / list presented to : Patient      Expected Discharge Plan and Services     Post Acute Care Choice: Home Health Living arrangements for the past 2 months: Single Family Home                                      Prior Living Arrangements/Services Living arrangements for the past 2 months: Single Family Home Lives with:: Self Patient language and need for interpreter reviewed:: Yes Do you feel safe going back to the place where you live?: Yes      Need for Family Participation in Patient Care: No (Comment) Care giver support system in place?: No (comment) Current home services: DME (cane) Criminal Activity/Legal Involvement Pertinent to Current Situation/Hospitalization: No - Comment as needed  Activities of Daily Living   ADL Screening (condition at time of admission) Independently performs ADLs?: No Does the patient have a NEW difficulty with bathing/dressing/toileting/self-feeding that is expected to last >3 days?: Yes (Initiates electronic notice to provider for possible OT consult) Does the patient have a NEW difficulty with getting in/out of bed, walking, or climbing stairs that is expected to  last >3 days?: Yes (Initiates electronic notice to provider for possible PT consult) Does the patient have a NEW difficulty with communication that is expected to last >3 days?: No Is the patient deaf or have difficulty hearing?: No Does the patient have difficulty seeing, even when wearing glasses/contacts?: No Does the patient have difficulty concentrating, remembering, or making decisions?: Yes  Permission Sought/Granted                  Emotional Assessment     Affect (typically observed): Appropriate Orientation: : Oriented to Self, Oriented to Place, Oriented to  Time, Oriented to Situation Alcohol / Substance Use: Not Applicable Psych Involvement: No (comment)  Admission diagnosis:  Metabolic acidosis [E87.20] Atrial fibrillation with rapid ventricular response (HCC) [I48.91] Atrial fibrillation with RVR (HCC) [I48.91] Sepsis, due to unspecified organism, unspecified whether acute organ dysfunction present [facility]) [A41.9] Patient Active Problem List   Diagnosis Date Noted   Lumbar radiculopathy 09/20/2024   Atrial fibrillation with RVR (HCC) 09/19/2024   Fall at home 09/19/2024   Elevated CK 09/19/2024   Knee pain 09/19/2024   Leukocytosis 09/19/2024   Idiopathic chronic gout without tophus 05/20/2024   Acute cystitis without hematuria 04/09/2024   Acute pharyngitis 03/20/2024   Acute left ankle pain 03/20/2024   Hospital discharge follow-up 02/13/2024   AKI (acute kidney injury) 01/23/2024   Type 2 diabetes mellitus with hyperglycemia,  with long-term current use of insulin  (HCC) 01/23/2024   Iron  deficiency anemia 01/23/2024   Atherosclerosis of native coronary artery of native heart without angina pectoris 01/23/2024   Acute on chronic heart failure with preserved ejection fraction (HCC) 01/22/2024   Pulmonary hypertension, unspecified (HCC) 01/22/2024   Chronic combined systolic and diastolic CHF (congestive heart failure) (HCC) 12/17/2023   CKD stage 3a, GFR  45-59 ml/min (HCC) - baseline Scr 1.2-1.4 12/17/2023   Acute hypoxic respiratory failure (HCC) 12/14/2023   Hypokalemia 12/14/2023   Pulmonary nodules 12/14/2023   Diabetic neuropathy (HCC) 11/15/2023   Acute bronchitis 11/11/2023   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   Encounter for general adult medical examination with abnormal findings 08/08/2023   Statin myopathy 07/18/2023   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, associated with type 2 diabetes mellitus (HCC) 07/18/2023   Allergic rhinitis 07/18/2023   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 06-28-2022 06/28/2022   Anemia due to chronic blood loss 06/08/2022   AVM (arteriovenous malformation) of small bowel, acquired    Obesity, Class II, BMI 35-39.9 05/22/2022   Constipation    Secondary hypercoagulable state 09/18/2021   History of colonic polyps 01/22/2017   Family hx of colorectal cancer 01/22/2017   Coronary artery disease involving native coronary artery of native heart with angina pectoris 05/17/2016   OSA on CPAP 04/25/2015   Persistent atrial fibrillation (HCC) 04/04/2015   Chronic anticoagulation    Dyspnea on exertion 01/18/2015   Paroxysmal atrial fibrillation (HCC) 07/06/2014   Calculus of kidney 06/14/2014   Type 2 diabetes mellitus with other specified complication (HCC) 06/14/2014   CA of prostate (HCC) 06/14/2014   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/14/2014   Essential hypertension 07/10/2013   Hyperlipidemia 07/10/2013   PCP:  [name], MD Pharmacy:   [id number] - [place], [place] - [address] AT [facility] [address] [place] [address] Phone: [phone] Fax: [phone]  [facility] - [place], [place] - [address] [facility] AT Portal to Registered [address] [place] [address] Phone: [phone] Fax: [phone]  [place]  - [facility] [address] [place] [address] Phone: [phone] Fax: [phone]     Social Drivers of Health (SDOH) Social History: SDOH Screenings   Food Insecurity: No Food Insecurity (09/19/2024)  Housing: Low Risk  (09/19/2024)  Transportation Needs: No Transportation Needs (09/19/2024)  Utilities: Not At Risk (09/19/2024)  Alcohol Screen: Low Risk  (01/27/2024)  Depression (PHQ2-9): Low Risk  (09/16/2024)  Financial Resource Strain: Low Risk  (02/10/2024)  Physical Activity: Inactive (02/10/2024)  Social Connections: Moderately Integrated (09/19/2024)  Stress: No Stress Concern Present (02/10/2024)  Tobacco Use: Medium Risk (09/04/2024)  Health Literacy: Adequate Health Literacy (11/20/2023)   SDOH Interventions:     Readmission Risk Interventions    01/28/2024    1:28 PM 01/14/2024   10:05 AM 12/17/2023   10:19 AM  Readmission Risk Prevention Plan  Transportation Screening Complete Complete Complete  Home Care Screening   Complete  Medication Review (RN CM)   Complete  HRI or Home Care Consult  Complete   Social Work Consult for Recovery Care Planning/Counseling  Complete   Palliative Care Screening  Not Applicable   Medication Review (RN Care Manager) Referral to Pharmacy Complete   PCP or Specialist appointment within 3-5 days of discharge Complete  HRI or Home Care Consult Complete    SW Recovery Care/Counseling Consult Complete    Palliative Care Screening Not Applicable    Skilled Nursing Facility Not Applicable

## 2024-09-20 NOTE — Plan of Care (Signed)

## 2024-09-20 NOTE — Evaluation (Signed)
 Physical Therapy Evaluation Patient Details Name: Lucas Dudley MRN: [id number] DOB: 12/12/1940 Today's Date: 09/20/2024  History of Present Illness  HPI: Lucas Dudley is a 83 year old male coronary artery disease, HFpEF, pulmonary hypertension, persistent A-fib, status post watchman implantation, hypertension, hyperlipidemia, OSA on CPAP, stage IIIa CKD, normocytic anemia, uncontrolled type 2 diabetes mellitus on insulin , statin myopathy, gout, diabetic neuropathy, chronic gastric erosion, obesity, DOE, constipation who currently lives alone.  He apparently had a fall about 4 days ago at home.  He did not seek medical treatment at the time.  He fell again 2 days ago and was not able to get up and had been on the floor for about 1-2 days.  He has been having difficulty for past few months with lower extremity weakness and his legs giving out.  They have not been able to determine the cause of this.  His daughter called EMS to check on him because she had not heard from him in a couple of days.  EMS found him to have right knee swelling.  He complained of shortness of breath and knee pain.  He has not had his medications in a couple of days.  The ED found him to be tachycardic in A-fib with RVR and was hypoxic and placed on 3 L nasal cannula.  His chest x-ray was suspicious for atelectasis and/or pneumonia.  He was started on IV diltiazem  infusion, IV antibiotics, Lasix , IV fluids.  He was noted to be tachypneic and had normal venous pH.  His blood sugar was 239.  His CK was 518.  Creatinine 1.44, GFR 48.5 mL/min and troponin 91.  White blood cell count 17.4, hemoglobin 11.9, platelets 271.  Bilirubin 2.2.  His trauma workup revealed normal pelvis imaging, right knee imaging reveals small suprapatellar joint effusion.  Admission requested for further management.  Clinical Impression  PT lives alone and has had multiple falls.  PT has significant weakness needing moderate assist to come sit to stand.  Pt will  benefit from SNF placement to improve his strength and endurance .          If plan is discharge home, recommend the following: A lot of help with walking and/or transfers;A little help with bathing/dressing/bathroom;Assistance with cooking/housework;Assist for transportation;Help with stairs or ramp for entrance   Can travel by private vehicle   Yes (SUV need to have higher level seats.)    Equipment Recommendations None recommended by PT  Recommendations for Other Services  OT consult    Functional Status Assessment Patient has had a recent decline in their functional status and demonstrates the ability to make significant improvements in function in a reasonable and predictable amount of time.     Precautions / Restrictions Precautions Precautions: Fall Recall of Precautions/Restrictions: Intact Restrictions Weight Bearing Restrictions Per Provider Order: No      Mobility  Bed Mobility Overal bed mobility: Needs Assistance Bed Mobility: Supine to Sit     Supine to sit: Mod assist          Transfers Overall transfer level: Needs assistance Equipment used: Rolling walker (2 wheels) Transfers: Sit to/from Stand Sit to Stand: Mod assist, From elevated surface                  Pertinent Vitals/Pain Pain Assessment Pain Assessment: No/denies pain    Home Living Family/patient expects to be discharged to:: Private residence Living Arrangements: Alone Available Help at Discharge: Family;Available PRN/intermittently Type of Home: House  Home Layout: One level Home Equipment: None      Prior Function Prior Level of Function : Independent/Modified Independent                     Extremity/Trunk Assessment        Lower Extremity Assessment Lower Extremity Assessment: Generalized weakness       Communication   Communication Communication: No apparent difficulties    Cognition Arousal: Alert Behavior During Therapy: WFL for  tasks assessed/performed   PT - Cognitive impairments: No apparent impairments                         Following commands: Intact       Cueing Cueing Techniques: Verbal cues     General Comments      Exercises General Exercises - Lower Extremity Ankle Circles/Pumps: Both, 10 reps Short Arc Quad: Both, 10 reps Heel Slides: Both, 5 reps Mini-Sqauts: 5 reps (sit to stand for improved LE strength)   Assessment/Plan    PT Assessment Patient needs continued PT services  PT Problem List Decreased strength;Decreased activity tolerance;Decreased balance;Decreased mobility       PT Treatment Interventions Gait training;Functional mobility training;Therapeutic exercise;Therapeutic activities;Balance training    PT Goals (Current goals can be found in the Care Plan section)  Acute Rehab PT Goals Patient Stated Goal: To stop falling PT Goal Formulation: With patient Time For Goal Achievement: 10/05/24 Potential to Achieve Goals: Good    Frequency Min 4X/week        AM-PAC PT 6 Clicks Mobility  Outcome Measure Help needed turning from your back to your side while in a flat bed without using bedrails?: A Little Help needed moving from lying on your back to sitting on the side of a flat bed without using bedrails?: A Lot Help needed moving to and from a bed to a chair (including a wheelchair)?: A Lot Help needed standing up from a chair using your arms (e.g., wheelchair or bedside chair)?: A Lot Help needed to walk in hospital room?: A Lot Help needed climbing 3-5 steps with a railing? : A Lot 6 Click Score: 13    End of Session Equipment Utilized During Treatment: Gait belt;Oxygen Activity Tolerance: Patient limited by fatigue Patient left: in chair;with call bell/phone within reach Nurse Communication: Mobility status PT Visit Diagnosis: Unsteadiness on feet (R26.81);Other abnormalities of gait and mobility (R26.89);Repeated falls (R29.6);Muscle weakness  (generalized) (M62.81);History of falling (Z91.81);Difficulty in walking, not elsewhere classified (R26.2)    Time: 9042-8951 PT Time Calculation (min) (ACUTE ONLY): 51 min   Charges:   PT Evaluation $PT Eval Moderate Complexity: 1 Mod PT Treatments $Therapeutic Exercise: 8-22 mins PT General Charges $$ ACUTE PT VISIT: 1 Visit        Montie Metro, PT CLT [phone]  09/20/2024, 10:54 AM

## 2024-09-21 ENCOUNTER — Encounter: Payer: Self-pay | Admitting: Physician Assistant

## 2024-09-21 ENCOUNTER — Encounter: Admitting: Internal Medicine

## 2024-09-21 ENCOUNTER — Encounter: Payer: Self-pay | Admitting: Cardiology

## 2024-09-21 LAB — CBC WITH DIFFERENTIAL/PLATELET
Abs Immature Granulocytes: 0.06 K/uL (ref 0.00–0.07)
Basophils Absolute: 0.1 K/uL (ref 0.0–0.1)
Basophils Relative: 0 %
Eosinophils Absolute: 0.2 K/uL (ref 0.0–0.5)
Eosinophils Relative: 1 %
HCT: 35.3 % — ABNORMAL LOW (ref 39.0–52.0)
Hemoglobin: 10.5 g/dL — ABNORMAL LOW (ref 13.0–17.0)
Immature Granulocytes: 1 %
Lymphocytes Relative: 8 %
Lymphs Abs: 1.1 K/uL (ref 0.7–4.0)
MCH: 24.1 pg — ABNORMAL LOW (ref 26.0–34.0)
MCHC: 29.7 g/dL — ABNORMAL LOW (ref 30.0–36.0)
MCV: 81 fL (ref 80.0–100.0)
Monocytes Absolute: 0.8 K/uL (ref 0.1–1.0)
Monocytes Relative: 6 %
Neutro Abs: 11 K/uL — ABNORMAL HIGH (ref 1.7–7.7)
Neutrophils Relative %: 84 %
Platelets: 234 K/uL (ref 150–400)
RBC: 4.36 MIL/uL (ref 4.22–5.81)
RDW: 17.9 % — ABNORMAL HIGH (ref 11.5–15.5)
WBC: 13.2 K/uL — ABNORMAL HIGH (ref 4.0–10.5)
nRBC: 0 % (ref 0.0–0.2)

## 2024-09-21 LAB — COMPREHENSIVE METABOLIC PANEL WITH GFR
ALT: 13 U/L (ref 0–44)
AST: 27 U/L (ref 15–41)
Albumin: 3 g/dL — ABNORMAL LOW (ref 3.5–5.0)
Alkaline Phosphatase: 98 U/L (ref 38–126)
Anion gap: 6 (ref 5–15)
BUN: 39 mg/dL — ABNORMAL HIGH (ref 8–23)
CO2: 33 mmol/L — ABNORMAL HIGH (ref 22–32)
Calcium: 8.3 mg/dL — ABNORMAL LOW (ref 8.9–10.3)
Chloride: 95 mmol/L — ABNORMAL LOW (ref 98–111)
Creatinine, Ser: 1.19 mg/dL (ref 0.61–1.24)
GFR, Estimated: >60 mL/min (ref >60)
Glucose, Bld: 118 mg/dL — ABNORMAL HIGH (ref 70–99)
Potassium: 3.5 mmol/L (ref 3.5–5.1)
Sodium: 134 mmol/L — ABNORMAL LOW (ref 135–145)
Total Bilirubin: 0.8 mg/dL (ref 0.0–1.2)
Total Protein: 6 g/dL — ABNORMAL LOW (ref 6.5–8.1)

## 2024-09-21 LAB — GLUCOSE, CAPILLARY
Glucose-Capillary: 142 mg/dL — ABNORMAL HIGH (ref 70–99)
Glucose-Capillary: 98 mg/dL (ref 70–99)
Glucose-Capillary: 75 mg/dL (ref 70–99)
Glucose-Capillary: 116 mg/dL — ABNORMAL HIGH (ref 70–99)
Glucose-Capillary: 260 mg/dL — ABNORMAL HIGH (ref 70–99)

## 2024-09-21 MED ORDER — INSULIN GLARGINE-YFGN 100 UNIT/ML ~~LOC~~ SOLN
30.0000 [IU] | Freq: Every day | SUBCUTANEOUS | Status: DC
  Administered 2024-09-21 – 2024-09-22 (×2): 30 [IU] via SUBCUTANEOUS
  Filled 2024-09-21 (×3): qty 0.3

## 2024-09-21 MED ORDER — FENTANYL CITRATE (PF) 50 MCG/ML IJ SOSY: 12.5000 ug | PREFILLED_SYRINGE | INTRAMUSCULAR | Status: DC | PRN

## 2024-09-21 MED ORDER — TORSEMIDE 20 MG PO TABS
20.0000 mg | ORAL_TABLET | Freq: Two times a day (BID) | ORAL | Status: DC
  Administered 2024-09-21 – 2024-09-22 (×3): 20 mg via ORAL
  Filled 2024-09-21 (×3): qty 1

## 2024-09-21 MED ORDER — INSULIN ASPART 100 UNIT/ML IJ SOLN
8.0000 [IU] | Freq: Three times a day (TID) | INTRAMUSCULAR | Status: DC
  Administered 2024-09-22 – 2024-09-23 (×3): 8 [IU] via SUBCUTANEOUS
  Filled 2024-09-21 (×5): qty 1

## 2024-09-21 MED ORDER — IRON SUCROSE 200 MG IVPB - SIMPLE MED
200.0000 mg | Freq: Once | Status: AC
  Administered 2024-09-21: 200 mg via INTRAVENOUS
  Filled 2024-09-21: qty 110

## 2024-09-21 MED ORDER — LIVING WELL WITH DIABETES BOOK: Freq: Once | Status: AC

## 2024-09-21 MED ORDER — DOXYCYCLINE HYCLATE 100 MG PO TABS
100.0000 mg | ORAL_TABLET | Freq: Two times a day (BID) | ORAL | Status: AC
  Administered 2024-09-21 – 2024-09-23 (×6): 100 mg via ORAL
  Filled 2024-09-21 (×6): qty 1

## 2024-09-21 NOTE — TOC Progression Note (Addendum)
 Transition of Care [facility]) - Progression Note    Patient Details  Name: SUN WILENSKY MRN: [id number] Date of Birth: 1941/08/24  [facility] CM/SW Contact  Sharlyne Stabs, RN Phone Number: 09/21/2024, 11:44 AM  Clinical Narrative:   Per MD patient now agreeable to SNF. CM called his daughter, she has is reviewing SNF's facilities for choices. Top 4 given. FL2 completed and sent out for bed offers.  Insurance Auth started, [employer] following.    Expected Discharge Plan: Skilled Nursing Facility Barriers to Discharge: Continued Medical Work up    Expected Discharge Plan and Services     Post Acute Care Choice: Home Health Living arrangements for the past 2 months: Single Family Home                      Social Drivers of Health (SDOH) Interventions SDOH Screenings   Food Insecurity: No Food Insecurity (09/19/2024)  Housing: Low Risk  (09/19/2024)  Transportation Needs: No Transportation Needs (09/19/2024)  Utilities: Not At Risk (09/19/2024)  Alcohol Screen: Low Risk  (01/27/2024)  Depression (PHQ2-9): Low Risk  (09/16/2024)  Financial Resource Strain: Low Risk  (02/10/2024)  Physical Activity: Inactive (02/10/2024)  Social Connections: Moderately Integrated (09/19/2024)  Stress: No Stress Concern Present (02/10/2024)  Tobacco Use: Medium Risk (09/20/2024)  Health Literacy: Adequate Health Literacy (11/20/2023)    Readmission Risk Interventions    01/28/2024    1:28 PM 01/14/2024   10:05 AM 12/17/2023   10:19 AM  Readmission Risk Prevention Plan  Transportation Screening Complete Complete Complete  Home Care Screening   Complete  Medication Review (RN CM)   Complete  HRI or Home Care Consult  Complete   Social Work Consult for Recovery Care Planning/Counseling  Complete   Palliative Care Screening  Not Applicable   Medication Review Oceanographer) Referral to Pharmacy Complete   PCP or Specialist appointment within 3-5 days of discharge Complete    HRI or Home Care  Consult Complete    SW Recovery Care/Counseling Consult Complete    Palliative Care Screening Not Applicable    Skilled Nursing Facility Not Applicable

## 2024-09-21 NOTE — Plan of Care (Signed)
  Problem: Acute Rehab OT Goals (only OT should resolve) Goal: Pt. Will Perform Grooming Flowsheets (Taken 09/21/2024 1407) Pt Will Perform Grooming: with modified independence Goal: Pt. Will Perform Lower Body Bathing Flowsheets (Taken 09/21/2024 1407) Pt Will Perform Lower Body Bathing: with modified independence Goal: Pt. Will Perform Lower Body Dressing Flowsheets (Taken 09/21/2024 1407) Pt Will Perform Lower Body Dressing: with modified independence Goal: Pt. Will Transfer To Toilet Flowsheets (Taken 09/21/2024 1407) Pt Will Transfer to Toilet: with modified independence Goal: Pt. Will Perform Toileting-Clothing Manipulation Flowsheets (Taken 09/21/2024 1407) Pt Will Perform Toileting - Clothing Manipulation and hygiene: with modified independence Goal: Pt/Caregiver Will Perform Home Exercise Program Flowsheets (Taken 09/21/2024 1407) Pt/caregiver will Perform Home Exercise Program:  Increased strength  Both right and left upper extremity  Independently  Cerissa Zeiger OT, MOT

## 2024-09-21 NOTE — Consult Note (Signed)
 Cardiology Consultation   Patient ID: Lucas Dudley MRN: [id number]; DOB: [birth year]  Admit date: 09/19/2024 Date of Consult: 09/21/2024  PCP:  [name], MD   [facility] Providers Cardiologist:  [name], MD  Electrophysiologist:  [name], MD (Inactive)  {    Patient Profile: Lucas Dudley is a 83 y.o. 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  Recurrent falls at home. Fall 4 days prior to admission, did not seek medical attention. Recurrent fall 2 days prior to admission, was not able to get up on his own. Reportedly down 24-48 hours before being found. Has had progressive LE weakness last several months. Being down had not taken his medications for that time period.  The ED found him to be tachycardic in A-fib with RVR and was hypoxic and placed on 3 L nasal cannula.    WBC 17.4 Hgb 11.9 Plt 271 K 3.6 BUN 34 Cr 1.44 proBNP 4082 CK 518 A1c 11.1 Procalc 1.01 VBG pH 7.35 pCO2 57 Trop 91-->84 EKG afib 130s CXR left retrocardiac opacity, atelectasis vs infiltrate with possible effusion MRI brain: no acute process   12/2023 echo: LVE 55-60%, indet diastolic function, mild RV dysfunction, severe pulm HTN PASP 63 01/2024 limited echo: LVEF 55-60%, no WMAs, indet diastolic function, mild RV dysfunction, mod pulm HTN PASP 47.   01/2024 cath: mod nonobstructive CAD, mean PA 32, PCWP 16, PVR 2.7, CI 2.59  Past Medical History:  Diagnosis Date   Arthritis    Atrial fibrillation (HCC)    CAD (coronary artery disease)    a. Cath 03/17/15 showing 100% ostial D1, 50% prox LAD to mid LAD, 40%  RPDA stenosis. Med rx. // Myoview  01/2020: EF 31 diffuse perfusion defect without reversibility (suspect artifact); reviewed with Dr. Suellen study felt to be low risk   Cataract    Mixed form OD   Chronic diastolic CHF (congestive heart failure) (HCC)    Chronically elevated hemidiaphragm - Right Side    Diabetic retinopathy (HCC)    NPDR OU   Essential hypertension    Glaucoma    POAG OU   History of gout    Hyperlipidemia    Hypertensive retinopathy    OU   Kidney stones    Melanoma of neck (HCC)    NICM (nonischemic cardiomyopathy) (HCC)    OSA on CPAP 2012   Prostate cancer (HCC)    Type II diabetes mellitus (HCC)     Past Surgical History:  Procedure Laterality Date   ABDOMINAL HERNIA REPAIR     w/mesh   ATRIAL FIBRILLATION ABLATION N/A 06/06/2021   Procedure: ATRIAL FIBRILLATION ABLATION;  Surgeon: Rakers Lynwood, MD;  Location: [facility]AS[facility] CV [facility];  Service: Cardiovascular;  Laterality: N/A;   BIOPSY  05/24/2022   Procedure: BIOPSY;  Surgeon: Shaaron Lamar HERO, MD;  Location: [facility];  Service: Endoscopy;;   BIOPSY  05/30/2022   Procedure: BIOPSY;  Surgeon: Shaaron Lamar HERO, MD;  [facility]: [facility];  Service: Endoscopy;;   BIOPSY  01/10/2023   Procedure: BIOPSY;  Surgeon: Wilhelmenia Aloha Raddle., MD;  Location: [facility];  Service: Gastroenterology;;   CARDIAC CATHETERIZATION N/A 03/17/2015   Procedure: Left Heart Cath and Coronary Angiography;  Surgeon: Dorn JINNY Lesches, MD;  Location: [facility] INVASIVE CV LAB;  Service: Cardiovascular;  Laterality: N/A;   CARDIOVERSION N/A 08/09/2021   Procedure: CARDIOVERSION;  Surgeon: Raford Riggs, MD;  Location: [facility] ENDOSCOPY;  Service: Cardiovascular;  Laterality: N/A;   CARDIOVERSION N/A 11/10/2021   Procedure: CARDIOVERSION;  Surgeon: Sheena Pugh, DO;  Location: [facility] ENDOSCOPY;  Service: Cardiovascular;  Laterality: N/A;   carotid doppler  09/17/2008   rigt and left ICAs 0-49%;mildly  abnormal   CATARACT EXTRACTION Left  2020   Dr. Lelon   COLONOSCOPY N/A 05/16/2017   Procedure: COLONOSCOPY;  Surgeon: Golda Claudis PENNER, MD;  [facility]: [facility];  Service: Endoscopy;  Laterality: N/A;  930   COLONOSCOPY WITH PROPOFOL  N/A 05/24/2022   Procedure: COLONOSCOPY WITH PROPOFOL ;  Surgeon: Shaaron Lamar HERO, MD;  [facility]: [facility];  Service: Endoscopy;  Laterality: N/A;   DOPPLER ECHOCARDIOGRAPHY  05/25/2009   EF 50-55%,LA mildly dilated, LV function normal   ELECTROPHYSIOLOGIC STUDY N/A 04/05/2015   Procedure: Cardioversion;  Surgeon: Jerel Balding, MD;  Location: [facility];  Service: Cardiovascular;  Laterality: N/A;   ELECTROPHYSIOLOGIC STUDY N/A 09/06/2015   Procedure: Atrial Fibrillation Ablation;  Surgeon: [name], MD;  Location: [facility] INVASIVE CV LAB;  Service: Cardiovascular;  Laterality: N/A;   ELECTROPHYSIOLOGIC STUDY N/A 07/12/2016   redo afib ablation by Dr Rakers   ENTEROSCOPY N/A 05/30/2022   Procedure: ENTEROSCOPY;  Surgeon: Shaaron Lamar HERO, MD;  [facility]: [facility];  Service: Endoscopy;  Laterality: N/A;   ENTEROSCOPY N/A 01/07/2024   Procedure: ENTEROSCOPY;  Surgeon: Eartha Angelia Sieving, MD;  [facility]: [facility];  Service: Gastroenterology;  Laterality: N/A;  1:45PM;ASA 3   ESOPHAGOGASTRODUODENOSCOPY (EGD) WITH PROPOFOL  N/A 05/24/2022   Procedure: ESOPHAGOGASTRODUODENOSCOPY (EGD) WITH PROPOFOL ;  Surgeon: Shaaron Lamar HERO, MD;  [facility];  Service: Endoscopy;  Laterality: N/A;   ESOPHAGOGASTRODUODENOSCOPY (EGD) WITH PROPOFOL  N/A 01/10/2023   Procedure: ESOPHAGOGASTRODUODENOSCOPY (EGD) WITH PROPOFOL ;  Surgeon: Wilhelmenia Aloha Raddle., MD;  Location: [facility] ENDOSCOPY;  Service: Gastroenterology;  Laterality: N/A;   EUS N/A 01/10/2023   Procedure: UPPER ENDOSCOPIC ULTRASOUND (EUS) RADIAL;  Surgeon: Wilhelmenia Aloha Raddle., MD;  Location: [facility] ENDOSCOPY;  Service: Gastroenterology;  Laterality: N/A;   EXCISIONAL HEMORRHOIDECTOMY     inside and out   EYE SURGERY Left 2020    Cat Sx - Dr. Lelon   FINE NEEDLE ASPIRATION Right    knee; drew ~ 1 quart off   GIVENS CAPSULE STUDY N/A 05/25/2022   Procedure: GIVENS CAPSULE STUDY;  Surgeon: Cindie Carlin POUR, DO;  [facility]: [facility];  Service: Endoscopy;  Laterality: N/A;   GIVENS CAPSULE STUDY N/A 01/23/2023   Procedure: GIVENS CAPSULE STUDY;  Surgeon: Eartha Angelia Sieving, MD;  [facility]: [facility];  Service: Gastroenterology;  Laterality: N/A;  8:30am   HERNIA REPAIR     HOT HEMOSTASIS N/A 01/10/2023   Procedure: HOT HEMOSTASIS (ARGON PLASMA COAGULATION/BICAP);  Surgeon: Wilhelmenia Aloha Raddle., MD;  Location: [facility];  Service: Gastroenterology;  Laterality: N/A;   HOT HEMOSTASIS  01/07/2024   Procedure: EGD, WITH ARGON PLASMA COAGULATION;  Surgeon: Eartha Angelia Sieving, MD;  [facility];  Service: Gastroenterology;;   LAPAROSCOPIC CHOLECYSTECTOMY     LEFT ATRIAL APPENDAGE OCCLUSION N/A 06/28/2022   Procedure: LEFT ATRIAL APPENDAGE OCCLUSION;  Surgeon: Wonda Sharper, MD;  Location: [facility] INVASIVE CV LAB;  Service: Cardiovascular;  Laterality: N/A;   MELANOMA EXCISION Right    neck   NM MYOCAR PERF WALL MOTION  02/21/2012   EF 61% ,EXERCISE 7 METS. exercise stopped due to wheezing and shortness of breathe   POLYPECTOMY  05/16/2017   Procedure: POLYPECTOMY;  Surgeon: Golda Claudis PENNER, MD;  [facility];  Service: Endoscopy;;  colon   POLYPECTOMY  05/24/2022   Procedure: POLYPECTOMY;  Surgeon: Shaaron Lamar HERO, MD;  [facility];  Service: Endoscopy;;   POLYPECTOMY  01/10/2023   Procedure: POLYPECTOMY;  Surgeon: Wilhelmenia Aloha Raddle., MD;  Location: [facility];  Service: Gastroenterology;;   PROSTATECTOMY     RIGHT/LEFT HEART CATH AND CORONARY ANGIOGRAPHY N/A 01/27/2024   Procedure: RIGHT/LEFT HEART CATH AND CORONARY ANGIOGRAPHY;  Surgeon: Zenaida Morene PARAS, MD;  Location: [facility] INVASIVE CV LAB;  Service: Cardiovascular;  Laterality: N/A;   SHOULDER OPEN ROTATOR  CUFF REPAIR Right X 2   SUBMUCOSAL TATTOO INJECTION  05/30/2022   Procedure: SUBMUCOSAL TATTOO INJECTION;  Surgeon: Shaaron Lamar HERO, MD;  [facility];  Service: Endoscopy;;   SUBMUCOSAL TATTOO INJECTION  01/10/2023   Procedure: SUBMUCOSAL TATTOO INJECTION;  Surgeon: Wilhelmenia Aloha Raddle., MD;  Location: [facility];  Service: Gastroenterology;;   TEE WITHOUT CARDIOVERSION N/A 09/05/2015   Procedure: TRANSESOPHAGEAL ECHOCARDIOGRAM (TEE);  Surgeon: Wilbert JONELLE Bihari, MD;  Location: [facility] ENDOSCOPY;  Service: Cardiovascular;  Laterality: N/A;   TEE WITHOUT CARDIOVERSION N/A 06/28/2022   Procedure: TRANSESOPHAGEAL ECHOCARDIOGRAM (TEE);  Surgeon: Wonda Sharper, MD;  Location: [facility] INVASIVE CV LAB;  Service: Cardiovascular;  Laterality: N/A;      Scheduled Meds:  acetaminophen   650 mg Oral Q6H   Or   acetaminophen   650 mg Rectal Q6H   aspirin   81 mg Oral Daily   brimonidine   1 drop Both Eyes BID   And   timolol   1 drop Both Eyes BID   Chlorhexidine  Gluconate Cloth  6 each Topical Daily   dapagliflozin  propanediol  10 mg Oral Daily   diltiazem   30 mg Oral Q6H   dorzolamide   1 drop Both Eyes BID   enoxaparin  (LOVENOX ) injection  40 mg Subcutaneous Q24H   ferrous sulfate   325 mg Oral Q breakfast   gabapentin   300 mg Oral TID   insulin  aspart  0-9 Units Subcutaneous TID WC   insulin  aspart  10 Units Subcutaneous TID WC   insulin  glargine-yfgn  35 Units Subcutaneous QHS   pantoprazole   40 mg Oral QPM   torsemide   20 mg Oral BID   Continuous Infusions:  sodium chloride  Stopped (09/20/24 2323)   cefTRIAXone  (ROCEPHIN )  IV Stopped (09/20/24 1332)   doxycycline  (VIBRAMYCIN ) IV 125 mL/hr at 09/20/24 2350   PRN Meds: sodium chloride , bisacodyl , fentaNYL  (SUBLIMAZE ) injection, Influenza vac split trivalent PF, ipratropium-albuterol , LORazepam , ondansetron  **OR** ondansetron  (ZOFRAN ) IV, mouth rinse, oxyCODONE , pneumococcal 20-valent conjugate vaccine, traZODone   Allergies:    Allergies   Allergen Reactions   Silicone Dermatitis   Other Dermatitis and Other (See Comments)    Adhesive agent (substance)   Azithromycin  Nausea Only   Tape Rash and Other (See Comments)    Causes skin redness, Use paper tape only.    Social History:   Social History   Socioeconomic History   Marital status: Widowed    Spouse name: Not on file   Number of children: 1   Years of education: Not on file   Highest education level: GED or equivalent  Occupational History  Occupation: Retired  Tobacco Use   Smoking status: Former    Current packs/day: 0.00    Average packs/day: 2.0 packs/day for 27.0 years (54.0 ttl pk-yrs)    Types: Cigarettes    Start date: 63    Quit date: 1992    Years since quitting: 33.9    Passive exposure: Current   Smokeless tobacco: Never   Tobacco comments:    Former smoker 09/27/21  Vaping Use   Vaping status: Never Used  Substance and Sexual Activity   Alcohol use: No    Alcohol/week: 0.0 standard drinks of alcohol    Comment: used to drink; stopped ~ 2008   Drug use: No   Sexual activity: Not Currently  Other Topics Concern   Not on file  Social History Narrative   Lives in [place], [place] with wife.   Social Drivers of Corporate Investment Banker Strain: Low Risk  (02/10/2024)   Overall Financial Resource Strain (CARDIA)    Difficulty of Paying Living Expenses: Not very hard  Food Insecurity: No Food Insecurity (09/19/2024)   Hunger Vital Sign    Worried About Running Out of Food in the Last Year: Never true    Ran Out of Food in the Last Year: Never true  Transportation Needs: No Transportation Needs (09/19/2024)   PRAPARE - Administrator, Civil Service (Medical): No    Lack of Transportation (Non-Medical): No  Physical Activity: Inactive (02/10/2024)   Exercise Vital Sign    Days of Exercise per Week: 0 days    Minutes of Exercise per Session: 0 min  Stress: No Stress Concern Present (02/10/2024)   [employer] of  Occupational Health - Occupational Stress Questionnaire    Feeling of Stress : Only a little  Social Connections: Moderately Integrated (09/19/2024)   Social Connection and Isolation Panel    Frequency of Communication with Friends and Family: More than three times a week    Frequency of Social Gatherings with Friends and Family: More than three times a week    Attends Religious Services: More than 4 times per year    Active Member of [employer] or Organizations: Yes    Attends Banker Meetings: More than 4 times per year    Marital Status: Widowed  Intimate Partner Violence: Patient Unable To Answer (09/19/2024)   Humiliation, Afraid, Rape, and Kick questionnaire    Fear of Current or Ex-Partner: Patient unable to answer    Emotionally Abused: Patient unable to answer    Physically Abused: Patient unable to answer    Sexually Abused: Patient unable to answer    Family History:    Family History  Problem Relation Age of Onset   Heart disease Mother    Lung cancer Mother    Heart attack Mother 76   Diabetes Father    Heart disease Father    Stroke Brother    Healthy Daughter    Colon cancer Maternal Aunt        70s     ROS:  Please see the history of present illness.   All other ROS reviewed and negative.     Physical Exam/Data: Vitals:   09/21/24 0500 09/21/24 0536 09/21/24 0734 09/21/24 0800  BP: 134/80   132/80  Pulse:    89  Resp: (!) 21   20  Temp:  98.2 F (36.8 C) 98.2 F (36.8 C)   TempSrc:  Oral Axillary   SpO2:    94%  Weight:  103.5 kg    Height:        Intake/Output Summary (Last 24 hours) at 09/21/2024 0845 Last data filed at 09/20/2024 2350 Gross per 24 hour  Intake 1253.01 ml  Output 400 ml  Net 853.01 ml      09/21/2024    5:36 AM 09/19/2024    1:55 PM 09/19/2024   11:18 AM  Last 3 Weights  Weight (lbs) 228 lb 2.8 oz 221 lb 9 oz 228 lb  Weight (kg) 103.5 kg 100.5 kg 103.42 kg     Body mass index is 32.74 kg/m.  General:  Well  nourished, well developed, in no acute distress HEENT: normal Neck: no JVD Vascular: No carotid bruits; Distal pulses 2+ bilaterally Cardiac:irreg Lungs:  coarse breath sounds Abd: soft, nontender, no hepatomegaly  Ext: no edema Musculoskeletal:  2+ bilateral LE edema Skin: warm and dry  Neuro:  CNs 2-12 intact, no focal abnormalities noted Psych:  Normal affect    Laboratory Data: High Sensitivity Troponin:  No results for input(s): TROPONINIHS in the last 720 hours.   Chemistry Recent Labs  Lab 09/19/24 1128 09/20/24 0503 09/21/24 0335  NA 137 138 134*  K 3.6 2.7* 3.5  CL 91* 95* 95*  CO2 23 28 33*  GLUCOSE 239* 137* 118*  BUN 34* 38* 39*  CREATININE 1.44* 1.25* 1.19  CALCIUM  8.9 8.3* 8.3*  MG  --  2.4  --   GFRNONAA 49* 58* >60  ANIONGAP 23* 15 6    Recent Labs  Lab 09/19/24 1128 09/20/24 0503 09/21/24 0335  PROT 7.1 5.9* 6.0*  ALBUMIN 3.6 3.0* 3.0*  AST 49* 28 27  ALT 14 13 13   ALKPHOS 107 83 98  BILITOT 2.2* 1.0 0.8   Lipids No results for input(s): CHOL, TRIG, HDL, LABVLDL, LDLCALC, CHOLHDL in the last 168 hours.  Hematology Recent Labs  Lab 09/19/24 1128 09/20/24 0503 09/21/24 0335  WBC 17.4* 12.4* 13.2*  RBC 4.97 4.32 4.36  HGB 11.9* 10.4* 10.5*  HCT 41.0 35.1* 35.3*  MCV 82.5 81.3 81.0  MCH 23.9* 24.1* 24.1*  MCHC 29.0* 29.6* 29.7*  RDW 17.9* 17.7* 17.9*  PLT 271 229 234   Thyroid  No results for input(s): TSH, FREET4 in the last 168 hours.  BNP Recent Labs  Lab 09/19/24 1128  PROBNP 4,082.0*    DDimer No results for input(s): DDIMER in the last 168 hours.  Radiology/Studies:  MR BRAIN WO CONTRAST Result Date: 09/20/2024 EXAM: MRI BRAIN WITHOUT CONTRAST 09/20/2024 02:20:10 PM TECHNIQUE: Multiplanar multisequence MRI of the head/brain was performed without the administration of intravenous contrast. COMPARISON: CT head 10/18/2018 CLINICAL HISTORY: Neuro deficit, acute, stroke suspected. Generalized weakness, rule out  acute Cerebrovascular Accident. FINDINGS: BRAIN AND VENTRICLES: No acute infarct. No intracranial hemorrhage. No mass. No midline shift. No hydrocephalus. Mild for age chronic microvascular ischemic change. A few small remote left frontal cortical infarcts. Small remote right cerebellar infarct. Normal flow voids. ORBITS: No acute abnormality. SINUSES AND MASTOIDS: No acute abnormality. BONES AND SOFT TISSUES: Normal marrow signal. No acute soft tissue abnormality. IMPRESSION: 1. No acute intracranial abnormality. Electronically signed by: Gilmore Molt MD 09/20/2024 03:15 PM EST RP Workstation: [id number]   DG CHEST PORT 1 VIEW Result Date: 09/20/2024 EXAM: 1 VIEW(S) XRAY OF THE CHEST 09/20/2024 05:29:00 AM COMPARISON: 09/19/2024 CLINICAL HISTORY: 08790 Atelectasis 91209; 10031 Cough 10031 FINDINGS: LUNGS AND PLEURA: Low lung volumes. Possible left retrocardiac opacity, favored to reflect atelectasis, but difficult to exclude superimposed airspace opacity. Decreased left  pleural effusion. Trace right pleural effusion. No pneumothorax. HEART AND MEDIASTINUM: Cardiomegaly. Atherosclerotic calcifications. BONES AND SOFT TISSUES: No acute osseous abnormality. IMPRESSION: 1. Decreased left pleural effusion and trace right pleural effusion. 2. Possible left retrocardiac opacity, favored to reflect atelectasis, but difficult to exclude superimposed airspace opacity. 3. Cardiomegaly and atherosclerotic calcifications. Electronically signed by: Evalene Coho MD 09/20/2024 06:05 AM EST RP Workstation: [id number]   MR LUMBAR SPINE WO CONTRAST Result Date: 09/19/2024 EXAM: MRI LUMBAR SPINE 09/19/2024 04:34:40 PM TECHNIQUE: Multiplanar multisequence MRI of the lumbar spine was performed without the administration of intravenous contrast. COMPARISON: 04/08/2020 CLINICAL HISTORY: Lumbar radiculopathy, symptoms persist with > 6 wks treatment; Myelopathy, acute, lumbar spine. FINDINGS: BONES AND ALIGNMENT: Normal  alignment except for slight retrolisthesis at L2-L3 and L3-L4, which is stable. Normal vertebral body heights. Bone marrow signal is unremarkable except for extensive type 2 Modic changes at L2-L3 and L3-L4, and edematous changes that have progressed at L4-L5 and L5-S1. SPINAL CORD: The conus medullaris terminates at L1. SOFT TISSUES: No paraspinal mass. L1-L2: No significant disc herniation. No spinal canal stenosis or neural foraminal narrowing. L2-L3: Uncovertebral broad-based disc protrusion results in stable mild foraminal narrowing bilaterally. A broad-based disc protrusion and prominent epidural fat results in moderate central stenosis. Severe left and moderate right foraminal narrowing demonstrate some progression since the prior study. L3-L4: Broad-based disc protrusion has progressed. Moderate facet hypertrophy has progressed bilaterally. 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 Progressive moderate foraminal stenosis is present bilaterally, right greater than left. L4-L5: Chronic positive disc height is present. Endplate ridging and epidural lipomatosis result in moderate central canal stenosis. Moderate-to-severe right and moderate left foraminal stenosis is stable. L5-S1: Edematous changes have progressed. No significant disc herniation. No spinal canal stenosis or neural foraminal narrowing. IMPRESSION: 1. 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 2. Moderate central canal stenosis at L2-3 from broad-based disc protrusion and prominent epidural fat. 3. Progressive moderate foraminal stenosis bilaterally at L4-5, right greater than left. 4. Severe left and moderate right foraminal narrowing at L2-3 with some progression since prior study. 5. Progression of edematous changes at L4-5 and L5-S1. 6. Progression of broad-based disc protrusion and moderate facet hypertrophy  bilaterally at L4-5. Electronically signed by: Lonni Necessary MD 09/19/2024 06:28 PM EST RP Workstation: [id number]   DG Chest Port 1 View Result Date: 09/19/2024 EXAM: 1 VIEW(S) XRAY OF THE CHEST 09/19/2024 11:45:00 AM COMPARISON: 01/20/2024 CLINICAL HISTORY: shortness of breath FINDINGS: LUNGS AND PLEURA: Probable left retrocardiac opacity concerning for atelectasis or infiltrate. Possible effusion. No pneumothorax. HEART AND MEDIASTINUM: Stable cardiomegaly. No acute abnormality of the mediastinal silhouette. BONES AND SOFT TISSUES: No acute osseous abnormality. IMPRESSION: 1. Left retrocardiac opacity suspicious for atelectasis or infiltrate, with possible effusion. 2. Stable cardiomegaly. Electronically signed by: Lynwood Seip MD 09/19/2024 12:16 PM EST RP Workstation: [id number]   DG Knee Complete 4 Views Right Result Date: 09/19/2024 EXAM: 4 OR MORE VIEW(S) XRAY OF THE RIGHT KNEE 09/19/2024 11:45:00 AM COMPARISON: None available. CLINICAL HISTORY: shortness of breath FINDINGS: BONES AND JOINTS: No acute fracture. No malalignment. Small suprapatellar joint effusion is noted. Minimal narrowing of lateral joint space is noted. SOFT TISSUES: The soft tissues are unremarkable. IMPRESSION: 1. Small suprapatellar joint effusion. 2. Minimal lateral compartment joint space narrowing. Electronically signed by: Lynwood Seip MD 09/19/2024 12:14 PM EST RP Workstation: [id number]   DG Pelvis 1-2 Views Result Date: 09/19/2024 EXAM: 1 or 2  view(s) Xray of the pelvis 09/19/2024 11:45:00 AM COMPARISON: None available. CLINICAL HISTORY: Shortness of breath FINDINGS: BONES AND JOINTS: No acute fracture. No malalignment. SOFT TISSUES: The soft tissues are unremarkable. IMPRESSION: 1. No significant abnormality. Electronically signed by: Lynwood Seip MD 09/19/2024 12:13 PM EST RP Workstation: [id number]     Assessment and Plan:  1. PAF - has watchman device - RVR on admission. Was found down at home after fall  not able to get up, did not get his meds 1-2 days at home.  - RVR in setting of not getting meds, systemic stress from fall as well as possible pneumonia.  - initially on dilt drip, now off. Currently on oral dilt 30mg  every 6 hours.  - rates well controlled. At home was self rate controlled.  - continue short acting dilt at this time and follow rates.    2.Pneumonia - abx per primary team  3.Chronic HFpEF - 12/2023 echo: LVEF 55-60%, indet diastolic, mild RV dysfunction, severe pulm HTN PASP 63, dilated fixed IVC,  01/2024 limited echo: LVEF 55-60%, no WMAs, indet diastolic function, mild RV dysfunction, mod pulm HTN PASP 47.  Elevated proBNP, CXR without clear edema. Has chronic LE edema. Body habitus somewhat difficult to assess volume status otherwise, will check reds vest. Order standing weights as opposed to bed weights.  - on his home torsemide  20mg  bid which was restarted today   4.CAD -01/2024 cath: mod nonobstructive CAD, mean PA 32, PCWP 16, PVR 2.7, CI 2.59 - no acute issues    5. 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 Unclear if any additional etiologies. 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   Risk Assessment/Risk Scores:          For questions or updates, please contact  [facility] Please consult [email or web address] for contact info under      Signed, Alvan Carrier, MD  09/21/2024 8:45 AM

## 2024-09-21 NOTE — Inpatient Diabetes Management (Signed)
 Inpatient Diabetes Program Recommendations  AACE/ADA: New Consensus Statement on Inpatient Glycemic Control (2015)  Target Ranges:  Prepandial:   less than 140 mg/dL      Peak postprandial:   less than 180 mg/dL (1-2 hours)      Critically ill patients:  140 - 180 mg/dL   Lab Results  Component Value Date   GLUCAP 98 09/21/2024   HGBA1C 11.1 (H) 09/19/2024    Latest Reference Range & Units 02/12/24 13:33 05/20/24 15:51 09/19/24 13:07  Hemoglobin A1C 4.8 - 5.6 % 7.6 (H) 9.3 (H) 11.1 (H)  (H): Data is abnormally high  Diabetes history: DM2 Outpatient Diabetes medications:  Basaglar  44 units daily Farxiga  10 mg daily Amaryl  2 mg bid  Current orders for Inpatient glycemic control: Semglee  35 units daily Novolog  10 units tid meal coverage Farxiga  10 mg daily  Inpatient Diabetes Program Recommendations:    Noted Hgb A1c has increased currently 11.1 (glucose level 272 over the past 2-3 months). Spoke with patient via phone (DM coordinator @ [facility]). Reviewed with patient about A1c has increased over the past few months. Patient shared he has been eating fruit cups and did not know would raise his blood glucose since listed as no sugar added. Discussed fruits that are less carbohydrate and patient states understanding. Ordered Living Well With Diabetes booklet for patient review.  Thank you, Kieffer Blatz E. Konica Stankowski, RN, MSN, CNS, CDCES  Diabetes Coordinator Inpatient Glycemic Control Team Team Pager [phone] (8am-5pm) 09/21/2024 9:57 AM

## 2024-09-21 NOTE — Evaluation (Signed)
 Occupational Therapy Evaluation Patient Details Name: Lucas Dudley MRN: [id number] DOB: December 10, 1940 Today's Date: 09/21/2024   History of Present Illness   Lucas Dudley is a 83 year old male coronary artery disease, HFpEF, pulmonary hypertension, persistent A-fib, status post watchman implantation, hypertension, hyperlipidemia, OSA on CPAP, stage IIIa CKD, normocytic anemia, uncontrolled type 2 diabetes mellitus on insulin , statin myopathy, gout, diabetic neuropathy, chronic gastric erosion, obesity, DOE, constipation who currently lives alone.  He apparently had a fall about 4 days ago at home.  He did not seek medical treatment at the time.  He fell again 2 days ago and was not able to get up and had been on the floor for about 1-2 days.  He has been having difficulty for past few months with lower extremity weakness and his legs giving out.  They have not been able to determine the cause of this.  His daughter called EMS to check on him because she had not heard from him in a couple of days.  EMS found him to have right knee swelling.  He complained of shortness of breath and knee pain.  He has not had his medications in a couple of days.  The ED found him to be tachycardic in A-fib with RVR and was hypoxic and placed on 3 L nasal cannula.  His chest x-ray was suspicious for atelectasis and/or pneumonia.  He was started on IV diltiazem  infusion, IV antibiotics, Lasix , IV fluids.  He was noted to be tachypneic and had normal venous pH.  His blood sugar was 239.  His CK was 518.  Creatinine 1.44, GFR 48.5 mL/min and troponin 91.  White blood cell count 17.4, hemoglobin 11.9, platelets 271.  Bilirubin 2.2.  His trauma workup revealed normal pelvis imaging, right knee imaging reveals small suprapatellar joint effusion.  Admission requested for further management. (per MD)     Clinical Impressions Pt agreeable to OT and PT co-evaluation. Pt has PRN family support at baseline. Today pt required mod A for  bed mobility and mod to max A for EOB to chair transfer RW. Much assist to boost from bed with RW. B UE generally weak with assist needed for lower body ADL's at this time. Pt left in the chair with call bell within reach and chair alarm set. Pt will benefit from continued OT in the hospital to increase strength, balance, and endurance for safe ADL's.        If plan is discharge home, recommend the following:   A lot of help with walking and/or transfers;A lot of help with bathing/dressing/bathroom;Assistance with cooking/housework;Assist for transportation;Help with stairs or ramp for entrance     Functional Status Assessment   Patient has had a recent decline in their functional status and demonstrates the ability to make significant improvements in function in a reasonable and predictable amount of time.     Equipment Recommendations   None recommended by OT             Precautions/Restrictions   Precautions Precautions: Fall Recall of Precautions/Restrictions: Intact Restrictions Weight Bearing Restrictions Per Provider Order: No     Mobility Bed Mobility Overal bed mobility: Needs Assistance Bed Mobility: Supine to Sit     Supine to sit: Mod A     General bed mobility comments: Assist to move B LE to EOB.    Transfers Overall transfer level: Needs assistance Equipment used: Rolling walker (2 wheels) Transfers: Sit to/from Stand, Bed to chair/wheelchair/BSC Sit to Stand: Mod assist to  max A     Step pivot transfers: Mod assist     General transfer comment: labored movement; unsteady; EOB to chair with RW      Balance Overall balance assessment: Needs assistance Sitting-balance support: No upper extremity supported, Feet supported Sitting balance-Leahy Scale: Fair Sitting balance - Comments: fair to good seated at EOB   Standing balance support: Bilateral upper extremity supported, During functional activity, Reliant on assistive device for  balance Standing balance-Leahy Scale: Poor Standing balance comment: poor to fair with RW                           ADL either performed or assessed with clinical judgement   ADL Overall ADL's : Needs assistance/impaired     Grooming: Set up;Sitting   Upper Body Bathing: Set up;Sitting   Lower Body Bathing: Maximal assistance;Moderate assistance;Sitting/lateral leans   Upper Body Dressing : Set up;Sitting   Lower Body Dressing: Moderate assistance;Maximal assistance;Sitting/lateral leans Lower Body Dressing Details (indicate cue type and reason): Pt wears sliding shoes at baseline. Toilet Transfer: Moderate assistance;Stand-pivot;Rolling walker (2 wheels) Toilet Transfer Details (indicate cue type and reason): EOB to chair with RW Toileting- Clothing Manipulation and Hygiene: Moderate assistance;Sitting/lateral lean               Vision Baseline Vision/History: 1 Wears glasses Ability to See in Adequate Light: 1 Impaired Patient Visual Report: No change from baseline Vision Assessment?: No apparent visual deficits     Perception Perception: Not tested       Praxis Praxis: Not tested       Pertinent Vitals/Pain Pain Assessment Pain Assessment: No/denies pain     Extremity/Trunk Assessment Upper Extremity Assessment Upper Extremity Assessment: Generalized weakness   Lower Extremity Assessment Lower Extremity Assessment: Defer to PT evaluation   Cervical / Trunk Assessment Cervical / Trunk Assessment: Kyphotic   Communication Communication Communication: No apparent difficulties   Cognition Arousal: Lethargic (improved once seated and in chair) Behavior During Therapy: WFL for tasks assessed/performed Cognition: No apparent impairments             OT - Cognition Comments: A and O.                 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[employer] [employer] ADLs Comments: Independent    OT Problem List: Decreased strength;Decreased activity tolerance;Impaired balance (sitting and/or standing)   OT Treatment/Interventions: Self-care/ADL training;Therapeutic exercise;Therapeutic activities;Patient/family education;Balance training;DME and/or AE instruction      OT Goals(Current goals can be found in the care plan section)   Acute Rehab OT Goals Patient Stated Goal: Improve function OT Goal Formulation: With patient Time For Goal Achievement: 10/05/24 Potential to Achieve Goals: Good   OT Frequency:  Min 2X/week  Co-evaluation PT/OT/SLP Co-Evaluation/Treatment: Yes Reason for Co-Treatment: To address functional/ADL transfers   OT goals addressed during session: ADL's and self-care                       End of Session Equipment Utilized During Treatment: Rolling walker (2 wheels);Gait belt  Activity Tolerance: Patient tolerated treatment well Patient left: in chair;with call bell/phone within reach;with chair alarm set  OT Visit Diagnosis: Unsteadiness on feet (R26.81);Other abnormalities of gait and mobility (R26.89);Muscle weakness (generalized) (M62.81);History of falling (Z91.81)                Time: 8950-8890 OT Time Calculation (min): 20 min Charges:  OT General Charges $OT Visit: 1 Visit OT Evaluation $OT Eval Low Complexity: 1 Low  Dillyn Joaquin OT, MOT  Jayson Person 09/21/2024,  2:05 PM

## 2024-09-21 NOTE — Plan of Care (Signed)
   Problem: Activity: Goal: Risk for activity intolerance will decrease Outcome: Progressing   Problem: Coping: Goal: Level of anxiety will decrease Outcome: Progressing

## 2024-09-21 NOTE — Plan of Care (Signed)
  Problem: Acute Rehab PT Goals(only PT should resolve) Goal: Pt Will Go Supine/Side To Sit Outcome: Progressing Flowsheets (Taken 09/21/2024 1536) Pt will go Supine/Side to Sit:  with contact guard assist  with minimal assist Goal: Patient Will Transfer Sit To/From Stand Outcome: Progressing Flowsheets (Taken 09/21/2024 1536) Patient will transfer sit to/from stand:  with contact guard assist  with minimal assist Goal: Pt Will Transfer Bed To Chair/Chair To Bed Outcome: Progressing Flowsheets (Taken 09/21/2024 1536) Pt will Transfer Bed to Chair/Chair to Bed:  with contact guard assist  with min assist Goal: Pt Will Ambulate Outcome: Progressing Flowsheets (Taken 09/21/2024 1536) Pt will Ambulate:  25 feet  with minimal assist  with moderate assist  with rolling walker   3:37 PM, 09/21/24 Lynwood Music, MPT Physical Therapist with [facility] [phone] office [phone] mobile phone

## 2024-09-21 NOTE — Progress Notes (Signed)
 Physical Therapy Treatment Patient Details Name: Lucas Dudley MRN: [id number] DOB: [birth year] Today's Date: 09/21/2024   History of Present Illness Lucas Dudley is a 83 year old male coronary artery disease, HFpEF, pulmonary hypertension, persistent A-fib, status post watchman implantation, hypertension, hyperlipidemia, OSA on CPAP, stage IIIa CKD, normocytic anemia, uncontrolled type 2 diabetes mellitus on insulin , statin myopathy, gout, diabetic neuropathy, chronic gastric erosion, obesity, DOE, constipation who currently lives alone.  He apparently had a fall about 4 days ago at home.  He did not seek medical treatment at the time.  He fell again 2 days ago and was not able to get up and had been on the floor for about 1-2 days.  He has been having difficulty for past few months with lower extremity weakness and his legs giving out.  They have not been able to determine the cause of this.  His daughter called EMS to check on him because she had not heard from him in a couple of days.  EMS found him to have right knee swelling.  He complained of shortness of breath and knee pain.  He has not had his medications in a couple of days.  The ED found him to be tachycardic in A-fib with RVR and was hypoxic and placed on 3 L nasal cannula.  His chest x-ray was suspicious for atelectasis and/or pneumonia.  He was started on IV diltiazem  infusion, IV antibiotics, Lasix , IV fluids.  He was noted to be tachypneic and had normal venous pH.  His blood sugar was 239.  His CK was 518.  Creatinine 1.44, GFR 48.5 mL/min and troponin 91.  White blood cell count 17.4, hemoglobin 11.9, platelets 271.  Bilirubin 2.2.  His trauma workup revealed normal pelvis imaging, right knee imaging reveals small suprapatellar joint effusion.  Admission requested for further management.    PT Comments  Patient required HOB rasied and demonstrates slow labored movement for sitting up at bedside, had most difficulty completing sit to  stands due to BLE weakness and limited to a few steps at bedside before having to sit due to c/o fatigue, generalized weakness. Patient tolerated sitting up in chair after therapy. Patient will benefit from continued skilled physical therapy in hospital and recommended venue below to increase strength, balance, endurance for safe ADLs and gait.     If plan is discharge home, recommend the following: A lot of help with walking and/or transfers;Assistance with cooking/housework;Assist for transportation;Help with stairs or ramp for entrance;A lot of help with bathing/dressing/bathroom   Can travel by private vehicle     Yes  Equipment Recommendations  None recommended by PT    Recommendations for Other Services       Precautions / Restrictions Precautions Precautions: Fall Recall of Precautions/Restrictions: Intact Restrictions Weight Bearing Restrictions Per Provider Order: No     Mobility  Bed Mobility Overal bed mobility: Needs Assistance Bed Mobility: Supine to Sit     Supine to sit: Mod assist, HOB elevated     General bed mobility comments: slow labored movement with HOB raised    Transfers Overall transfer level: Needs assistance Equipment used: Rolling walker (2 wheels) Transfers: Sit to/from Stand, Bed to chair/wheelchair/BSC Sit to Stand: Mod assist, Max assist   Step pivot transfers: Mod assist       General transfer comment: had most diffiuclty compoleting sit to stands due to BLE weakness    Ambulation/Gait Ambulation/Gait assistance: Mod assist Gait Distance (Feet): 5 Feet Assistive device: Rolling walker (2 wheels)  Gait Pattern/deviations: Decreased step length - right, Decreased step length - left, Decreased stride length Gait velocity: slow     General Gait Details: limited to a few slow labored side steps before having to sit due to c/o fatgue and weakness, on room air with SpO2 at 90%   Stairs             Wheelchair Mobility      Tilt Bed    Modified Rankin (Stroke Patients Only)       Balance Overall balance assessment: Needs assistance Sitting-balance support: Feet supported, No upper extremity supported Sitting balance-Leahy Scale: Fair Sitting balance - Comments: fair/good seated at EOB   Standing balance support: Reliant on assistive device for balance, During functional activity, Bilateral upper extremity supported Standing balance-Leahy Scale: Poor Standing balance comment: fair/poor using RW                            Communication Communication Communication: No apparent difficulties  Cognition Arousal: Alert Behavior During Therapy: WFL for tasks assessed/performed   PT - Cognitive impairments: No apparent impairments                         Following commands: Intact      Cueing Cueing Techniques: Verbal cues  Exercises      General Comments        Pertinent Vitals/Pain Pain Assessment Pain Assessment: No/denies pain    Home Living Family/patient expects to be discharged to:: Private residence Living Arrangements: Alone Available Help at Discharge: Family;Available PRN/intermittently Type of Home: House         Home Layout: One level Home Equipment: Cane - single point      Prior Function            PT Goals (current goals can now be found in the care plan section) Acute Rehab PT Goals Patient Stated Goal: return home after rehab PT Goal Formulation: With patient Time For Goal Achievement: 10/05/24 Potential to Achieve Goals: Good Progress towards PT goals: Progressing toward goals    Frequency    Min 3X/week      PT Plan      Co-evaluation PT/OT/SLP Co-Evaluation/Treatment: Yes Reason for Co-Treatment: To address functional/ADL transfers PT goals addressed during session: Mobility/safety with mobility;Balance;Proper use of DME OT goals addressed during session: ADL's and self-care      AM-PAC PT 6 Clicks Mobility    Outcome Measure  Help needed turning from your back to your side while in a flat bed without using bedrails?: A Little Help needed moving from lying on your back to sitting on the side of a flat bed without using bedrails?: A Lot Help needed moving to and from a bed to a chair (including a wheelchair)?: A Lot Help needed standing up from a chair using your arms (e.g., wheelchair or bedside chair)?: A Lot Help needed to walk in hospital room?: A Lot Help needed climbing 3-5 steps with a railing? : A Lot 6 Click Score: 13    End of Session Equipment Utilized During Treatment: Gait belt Activity Tolerance: Patient tolerated treatment well;Patient limited by fatigue Patient left: in chair;with call bell/phone within reach Nurse Communication: Mobility status PT Visit Diagnosis: Unsteadiness on feet (R26.81);Other abnormalities of gait and mobility (R26.89);Muscle weakness (generalized) (M62.81)     Time: 8951-8891 PT Time Calculation (min) (ACUTE ONLY): 20 min  Charges:    $Therapeutic Activity: 8-22  mins PT General Charges $$ ACUTE PT VISIT: 1 Visit                     3:33 PM, 09/21/24 Lynwood Music, MPT Physical Therapist with [facility] [phone] office [phone] mobile phone

## 2024-09-21 NOTE — [place] FL2 (Signed)
 [place]  MEDICAID FL2 LEVEL OF CARE FORM     IDENTIFICATION  Patient Name: Lucas Dudley Birthdate: April 27, 1941 Sex: male Admission Date (Current Location): 09/19/2024  [facility] and [place] Number:  [employer] and Address:  [facility],  [address] [place] [address]      Provider Number: [id number]  Attending Physician Name and Address:  Vicci Afton CROME, MD  Relative Name and Phone Number:  Jon Barters ( daughter ) [phone]    Current Level of Care: Hospital Recommended Level of Care: Skilled Nursing Facility Prior Approval Number:    Date Approved/Denied:   PASRR Number: [id number]  Discharge Plan: SNF    Current Diagnoses: Patient Active Problem List   Diagnosis Date Noted   Lumbar radiculopathy 09/20/2024   Atrial fibrillation with RVR (HCC) 09/19/2024   Fall at home 09/19/2024   Elevated CK 09/19/2024   Knee pain 09/19/2024   Leukocytosis 09/19/2024   Idiopathic chronic gout without tophus 05/20/2024   Acute cystitis without hematuria 04/09/2024   Acute pharyngitis 03/20/2024   Acute left ankle pain 03/20/2024   Hospital discharge follow-up 02/13/2024   AKI (acute kidney injury) 01/23/2024   Type 2 diabetes mellitus with hyperglycemia, with long-term current use of insulin  (HCC) 01/23/2024   Iron  deficiency anemia 01/23/2024   Atherosclerosis of native coronary artery of native heart without angina pectoris 01/23/2024   Acute on chronic heart failure with preserved ejection fraction (HCC) 01/22/2024   Pulmonary hypertension, unspecified (HCC) 01/22/2024   Chronic combined systolic and diastolic CHF (congestive heart failure) (HCC) 12/17/2023   CKD stage 3a, GFR 45-59 ml/min (HCC) - baseline Scr 1.2-1.4 12/17/2023   Acute hypoxic respiratory failure (HCC) 12/14/2023   Hypokalemia 12/14/2023   Pulmonary nodules 12/14/2023   Diabetic neuropathy (HCC) 11/15/2023   Acute bronchitis 11/11/2023   Chronic gastric erosion  08/08/2023   Encounter for general adult medical examination with abnormal findings 08/08/2023   Statin myopathy 07/18/2023   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, associated with type 2 diabetes mellitus (HCC) 07/18/2023   Allergic rhinitis 07/18/2023   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 06-28-2022 06/28/2022   Anemia due to chronic blood loss 06/08/2022   AVM (arteriovenous malformation) of small bowel, acquired    Obesity, Class II, BMI 35-39.9 05/22/2022   Constipation    Secondary hypercoagulable state 09/18/2021   History of colonic polyps 01/22/2017   Family hx of colorectal cancer 01/22/2017   Coronary artery disease involving native coronary artery of native heart with angina pectoris 05/17/2016   OSA on CPAP 04/25/2015   Persistent atrial fibrillation (HCC) 04/04/2015   Chronic anticoagulation    Dyspnea on exertion 01/18/2015   Paroxysmal atrial fibrillation (HCC) 07/06/2014   Calculus of kidney 06/14/2014   Type 2 diabetes mellitus with other specified complication (HCC) 06/14/2014   CA of prostate (HCC) 06/14/2014   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/14/2014   Essential hypertension 07/10/2013   Hyperlipidemia 07/10/2013    Orientation RESPIRATION BLADDER Height & Weight     Self, Time, Situation, Place  O2 (2L) Continent Weight: 228 lb 2.8 oz (103.5 kg) Height:  5' 10 (177.8 cm)  BEHAVIORAL SYMPTOMS/MOOD NEUROLOGICAL BOWEL NUTRITION STATUS      Continent Diet (Heart Healthy/ Carb)  AMBULATORY STATUS COMMUNICATION OF NEEDS Skin   Extensive Assist Verbally Normal  Personal Care Assistance Level of Assistance  Bathing, Feeding, Dressing Bathing Assistance: Maximum assistance Feeding assistance: Independent Dressing Assistance: Maximum assistance     Functional Limitations Info  Sight, Hearing, Speech Sight Info: Adequate Hearing Info:  Impaired Speech Info: Adequate    SPECIAL CARE FACTORS FREQUENCY  PT (By licensed PT), OT (By licensed OT)     PT Frequency: 5 x a week OT Frequency: 5 x a week            Contractures Contractures Info: Not present    Additional Factors Info  Code Status, Allergies Code Status Info: FULL Allergies Info: Silicone, Azithromycin , Tape           Current Medications (09/21/2024):  This is the current hospital active medication list Current Facility-Administered Medications  Medication Dose Route Frequency Provider Last Rate Last Admin   0.9 %  sodium chloride  infusion   Intravenous PRN Johnson, Clanford L, MD   Stopped at 09/20/24 2323   acetaminophen  (TYLENOL ) tablet 650 mg  650 mg Oral Q6H Johnson, Clanford L, MD   650 mg at 09/21/24 0820   Or   acetaminophen  (TYLENOL ) suppository 650 mg  650 mg Rectal Q6H Johnson, Clanford L, MD       aspirin  chewable tablet 81 mg  81 mg Oral Daily Johnson, Clanford L, MD   81 mg at 09/21/24 0820   bisacodyl  (DULCOLAX) EC tablet 5 mg  5 mg Oral Daily PRN Johnson, Clanford L, MD       brimonidine  (ALPHAGAN ) 0.2 % ophthalmic solution 1 drop  1 drop Both Eyes BID Johnson, Clanford L, MD   1 drop at 09/21/24 9177   And   timolol  (TIMOPTIC ) 0.5 % ophthalmic solution 1 drop  1 drop Both Eyes BID Johnson, Clanford L, MD   1 drop at 09/21/24 9178   cefTRIAXone  (ROCEPHIN ) 2 g in sodium chloride  0.9 % 100 mL IVPB  2 g Intravenous Q24H Vicci, Clanford L, MD   Stopped at 09/20/24 1332   Chlorhexidine  Gluconate Cloth 2 % PADS 6 each  6 each Topical Daily Vicci, Clanford L, MD   6 each at 09/20/24 0819   dapagliflozin  propanediol (FARXIGA ) tablet 10 mg  10 mg Oral Daily Johnson, Clanford L, MD   10 mg at 09/21/24 0820   diltiazem  (CARDIZEM ) tablet 30 mg  30 mg Oral Q6H Johnson, Clanford L, MD   30 mg at 09/21/24 0539   dorzolamide  (TRUSOPT ) 2 % ophthalmic solution 1 drop  1 drop Both Eyes BID Vicci, Clanford L, MD   1 drop at 09/21/24 9178    doxycycline  (VIBRA -TABS) tablet 100 mg  100 mg Oral Q12H Johnson, Clanford L, MD       enoxaparin  (LOVENOX ) injection 40 mg  40 mg Subcutaneous Q24H Johnson, Clanford L, MD   40 mg at 09/20/24 2149   fentaNYL  (SUBLIMAZE ) injection 12.5 mcg  12.5 mcg Intravenous Q2H PRN Johnson, Clanford L, MD       ferrous sulfate  tablet 325 mg  325 mg Oral Q breakfast Johnson, Clanford L, MD   325 mg at 09/21/24 0820   gabapentin  (NEURONTIN ) capsule 300 mg  300 mg Oral TID Vicci, Clanford L, MD   300 mg at 09/21/24 0820   Influenza vac split trivalent PF (FLUZONE HIGH-DOSE) injection 0.5 mL  0.5 mL Intramuscular Prior to discharge Johnson, Clanford L, MD       insulin  aspart (novoLOG ) injection 0-9 Units  0-9 Units Subcutaneous TID WC Johnson, Clanford L, MD  1 Units at 09/20/24 1145   insulin  aspart (novoLOG ) injection 10 Units  10 Units Subcutaneous TID WC Johnson, Clanford L, MD   10 Units at 09/21/24 9178   insulin  glargine-yfgn (SEMGLEE ) injection 35 Units  35 Units Subcutaneous QHS Johnson, Clanford L, MD   35 Units at 09/20/24 2150   ipratropium-albuterol  (DUONEB) 0.5-2.5 (3) MG/3ML nebulizer solution 3 mL  3 mL Nebulization Q4H PRN Vicci, Clanford L, MD       living well with diabetes book MISC   Does not apply Once Johnson, Clanford L, MD       LORazepam  (ATIVAN ) injection 0.5 mg  0.5 mg Intravenous Once PRN Johnson, Clanford L, MD       ondansetron  (ZOFRAN ) tablet 4 mg  4 mg Oral Q6H PRN Johnson, Clanford L, MD       Or   ondansetron  (ZOFRAN ) injection 4 mg  4 mg Intravenous Q6H PRN Johnson, Clanford L, MD       Oral care mouth rinse  15 mL Mouth Rinse PRN Johnson, Clanford L, MD       oxyCODONE  (Oxy IR/ROXICODONE ) immediate release tablet 5 mg  5 mg Oral Q6H PRN Johnson, Clanford L, MD       pantoprazole  (PROTONIX ) EC tablet 40 mg  40 mg Oral QPM Johnson, Clanford L, MD   40 mg at 09/20/24 1823   pneumococcal 20-valent conjugate vaccine (PREVNAR 20 ) injection 0.5 mL  0.5 mL Intramuscular Prior to  discharge Johnson, Clanford L, MD       torsemide  (DEMADEX ) tablet 20 mg  20 mg Oral BID Johnson, Clanford L, MD   20 mg at 09/21/24 0820   traZODone  (DESYREL ) tablet 25 mg  25 mg Oral QHS PRN Johnson, Clanford L, MD   25 mg at 09/20/24 2312     Discharge Medications: Please see discharge summary for a list of discharge medications.  Relevant Imaging Results:  Relevant Lab Results:   Additional Information SSN: [id number]  Noreen KATHEE [place], [place]

## 2024-09-21 NOTE — Plan of Care (Signed)
 Problem: Education: Goal: Knowledge of General Education information will improve Description: Including pain rating scale, medication(s)/side effects and non-pharmacologic comfort measures Outcome: Progressing   Problem: Health Behavior/Discharge Planning: Goal: Ability to manage health-related needs will improve Outcome: Progressing   Problem: Clinical Measurements: Goal: Ability to maintain clinical measurements within normal limits will improve Outcome: Progressing Goal: Will remain free from infection Outcome: Progressing Goal: Diagnostic test results will improve Outcome: Progressing Goal: Respiratory complications will improve Outcome: Progressing Goal: Cardiovascular complication will be avoided Outcome: Progressing   Problem: Activity: Goal: Risk for activity intolerance will decrease Outcome: Progressing   Problem: Nutrition: Goal: Adequate nutrition will be maintained Outcome: Progressing   Problem: Coping: Goal: Level of anxiety will decrease Outcome: Progressing   Problem: Elimination: Goal: Will not experience complications related to bowel motility Outcome: Progressing Goal: Will not experience complications related to urinary retention Outcome: Progressing   Problem: Pain Managment: Goal: General experience of comfort will improve and/or be controlled Outcome: Progressing   Problem: Safety: Goal: Ability to remain free from injury will improve Outcome: Progressing   Problem: Skin Integrity: Goal: Risk for impaired skin integrity will decrease Outcome: Progressing   Problem: Education: Goal: Ability to describe self-care measures that may prevent or decrease complications (Diabetes Survival Skills Education) will improve Outcome: Progressing Goal: Individualized Educational Video(s) Outcome: Progressing   Problem: Coping: Goal: Ability to adjust to condition or change in health will improve Outcome: Progressing   Problem: Fluid  Volume: Goal: Ability to maintain a balanced intake and output will improve Outcome: Progressing   Problem: Health Behavior/Discharge Planning: Goal: Ability to identify and utilize available resources and services will improve Outcome: Progressing Goal: Ability to manage health-related needs will improve Outcome: Progressing   Problem: Metabolic: Goal: Ability to maintain appropriate glucose levels will improve Outcome: Progressing   Problem: Nutritional: Goal: Maintenance of adequate nutrition will improve Outcome: Progressing Goal: Progress toward achieving an optimal weight will improve Outcome: Progressing   Problem: Skin Integrity: Goal: Risk for impaired skin integrity will decrease Outcome: Progressing   Problem: Tissue Perfusion: Goal: Adequacy of tissue perfusion will improve Outcome: Progressing   Problem: Education: Goal: Ability to demonstrate management of disease process will improve Outcome: Progressing Goal: Ability to verbalize understanding of medication therapies will improve Outcome: Progressing Goal: Individualized Educational Video(s) Outcome: Progressing   Problem: Activity: Goal: Capacity to carry out activities will improve Outcome: Progressing   Problem: Cardiac: Goal: Ability to achieve and maintain adequate cardiopulmonary perfusion will improve Outcome: Progressing   Problem: Education: Goal: Knowledge of disease or condition will improve Outcome: Progressing Goal: Understanding of medication regimen will improve Outcome: Progressing Goal: Individualized Educational Video(s) Outcome: Progressing   Problem: Activity: Goal: Ability to tolerate increased activity will improve Outcome: Progressing   Problem: Cardiac: Goal: Ability to achieve and maintain adequate cardiopulmonary perfusion will improve Outcome: Progressing   Problem: Health Behavior/Discharge Planning: Goal: Ability to safely manage health-related needs after  discharge will improve Outcome: Progressing   Problem: Education: Goal: Knowledge of disease and its progression will improve Outcome: Progressing Goal: Individualized Educational Video(s) Outcome: Progressing   Problem: Fluid Volume: Goal: Compliance with measures to maintain balanced fluid volume will improve Outcome: Progressing   Problem: Health Behavior/Discharge Planning: Goal: Ability to manage health-related needs will improve Outcome: Progressing   Problem: Nutritional: Goal: Ability to make healthy dietary choices will improve Outcome: Progressing   Problem: Clinical Measurements: Goal: Complications related to the disease process, condition or treatment will be avoided or minimized Outcome: Progressing

## 2024-09-21 NOTE — Care Management Important Message (Signed)
 Important Message  Patient Details  Name: Lucas Dudley MRN: [id number] Date of Birth: December 08, 1940   Important Message Given:  N/A - LOS <3 / Initial given by admissions     [name] 09/21/2024, 1:13 PM

## 2024-09-21 NOTE — Progress Notes (Signed)
 PROGRESS NOTE   Lucas Dudley  FMW:[id number] DOB: 19-Sep-1941 DOA: 09/19/2024 PCP: [name], MD   Chief Complaint  Patient presents with   Fall   Level of care: Telemetry  Brief Admission History:  83 year old male coronary artery disease, HFpEF, pulmonary hypertension, persistent A-fib, status post watchman implantation, hypertension, hyperlipidemia, OSA on CPAP, stage IIIa CKD, normocytic anemia, uncontrolled type 2 diabetes mellitus on insulin , statin myopathy, gout, diabetic neuropathy, chronic gastric erosion, obesity, DOE, constipation who currently lives alone.  He apparently had a fall about 4 days ago at home.  He did not seek medical treatment at the time.  He fell again 2 days ago and was not able to get up and had been on the floor for about 1-2 days.  He has been having difficulty for past few months with lower extremity weakness and his legs giving out.  They have not been able to determine the cause of this.  His daughter called EMS to check on him because she had not heard from him in a couple of days.  EMS found him to have right knee swelling.  He complained of shortness of breath and knee pain.  He has not had his medications in a couple of days.  The ED found him to be tachycardic in A-fib with RVR and was hypoxic and placed on 3 L nasal cannula.  His chest x-ray was suspicious for atelectasis and/or pneumonia.  He was started on IV diltiazem  infusion, IV antibiotics, Lasix , IV fluids.  He was noted to be tachypneic and had normal venous pH.  His blood sugar was 239.  His CK was 518.  Creatinine 1.44, GFR 48.5 mL/min and troponin 91.  White blood cell count 17.4, hemoglobin 11.9, platelets 271.  Bilirubin 2.2.  His trauma workup revealed normal pelvis imaging, right knee imaging reveals small suprapatellar joint effusion.  Admission requested for further management.   Assessment and Plan:  Atrial fibrillation with RVR -- he was started on IV diltiazem  infusion in ED --  admitted to stepdown ICU --- transfer to telemetry bed  -- weaned off diltiazem  infusion to oral Cardizem  30 mg every 6 hours  -- he is s/p watchman device as he could not tolerate full anticoagulation -- he previously had controlled rate Afib without AV nodal agent per cardiology notes   Persistent atrial fibrillation  -- he is no longer on antiarrythymic therapy -- his HR had been controlled without AV nodal blockers -- follow for now and decide if he will require them after he recovers from acute illness   Presumed aspiration pneumonia Sepsis ruled out  -- could just be atelectasis on CXR but given he was down on floor for prolonged time he likely did aspirate  -- added incentive spirometry -- IV antibiotics  -- repeat CXR showing improvement    Generalized weakness Adult failure to thrive  Lumbar radiculopathy with leg weakness -- discussed with daughter symptoms of leg weakness progressing and requested MRI L spine -- if patient can tolerate will try to obtain MRI L spine without contrast  -- MRI with findings of 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  Will obtain PT evaluation.  MRI brain ordered to rule out acute CVA -- MRI brain negative for CVA -- Pt recommending SNF and patient is agreeable. TOC consulted.     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 -- resume basal and prandial insulin  -- SSI coverage and frequent CBG monitoring ordered -- updated A1c 11.1%  CBG (last 3)  Recent Labs    09/20/24 2115 09/21/24 0251 09/21/24 0731  GLUCAP 166* 142* 98    Acute urinary retention -- I/O caths done in ED -- temporary foley cath placement ordered -- tamsulosin 0.4 mg daily  -- plan to remove foley 12/8 and do a voiding trial    Essential hypertension  -- weaned off IV diltiazem  infusion to oral diltiazem  30 mg every 6 hours     Hyperlipidemia -- he is reportedly intolerent to statins  --  cardiology had started him on zetia  daily previously but he now  says he doesn't take it    Stage 3a CKD -- stable, follow    OSA on CPAP -- nightly CPAP ordered   Normocytic anemia -- he is followed by hematology for this  -- he is no longer on octreotide  injections   CAD -- recent cath demonstrated 40% proximal to mid RCA stenosis and 60% PDA stenosis managed with medical therapy by cardiology.  -- he is on aspirin  81 mg daily   Chronic HFpEF -- resumed home torsemide  20 mg BID -- ReDs vest ordered by cardiology team   DVT prophylaxis: enoxaparin   Code Status: Full  Family Communication: daughter on phone 12/6, bedside update 12/7  Disposition: anticipate SNF rehab    Consultants:  cardiology Procedures:   Antimicrobials:  Anti-infectives (From admission, onward)    Start     Dose/Rate Route Frequency Ordered Stop   09/20/24 1300  cefTRIAXone  (ROCEPHIN ) 2 g in sodium chloride  0.9 % 100 mL IVPB        2 g 200 mL/hr over 30 Minutes Intravenous Every 24 hours 09/19/24 1805 09/24/24 1259   09/20/24 1100  doxycycline  (VIBRAMYCIN ) 100 mg in sodium chloride  0.9 % 250 mL IVPB        100 mg 125 mL/hr over 120 Minutes Intravenous Every 12 hours 09/20/24 0918     09/19/24 1245  cefTRIAXone  (ROCEPHIN ) 2 g in sodium chloride  0.9 % 100 mL IVPB        2 g 200 mL/hr over 30 Minutes Intravenous  Once 09/19/24 1231 09/19/24 1342   09/19/24 1245  azithromycin  (ZITHROMAX ) 500 mg in sodium chloride  0.9 % 250 mL IVPB  Status:  Discontinued        500 mg 250 mL/hr over 60 Minutes Intravenous Every 24 hours 09/19/24 1231 09/20/24 0918        Subjective: Pt says the weakness in his right leg is a bit better today and willing to work with PT more and agreeable to SNF rehab.   Objective: Vitals:   09/21/24 0536 09/21/24 0734 09/21/24 0800 09/21/24 0900  BP:   132/80 (!) 124/53  Pulse:   89 77  Resp:   20 (!) 21  Temp: 98.2 F (36.8 C) 98.2 F (36.8 C)    TempSrc: Oral Axillary    SpO2:   94% 92%  Weight: 103.5 kg     Height:         Intake/Output Summary (Last 24 hours) at 09/21/2024 1012 Last data filed at 09/21/2024 0956 Gross per 24 hour  Intake 1253.01 ml  Output 600 ml  Net 653.01 ml   Filed Weights   09/19/24 1118 09/19/24 1355 09/21/24 0536  Weight: 103.4 kg 100.5 kg 103.5 kg   Examination:  General exam: Appears calm and comfortable  Respiratory system: Clear to auscultation. Respiratory effort normal. Cardiovascular system: normal S1 & S2 heard. No JVD, murmurs, rubs, gallops or clicks. No  pedal edema. Gastrointestinal system: Abdomen is nondistended, soft and nontender. No organomegaly or masses felt. Normal bowel sounds heard. Central nervous system: Alert and oriented. No focal neurological deficits. Extremities: 1+ edema BLEs.  Symmetric 5 x 5 power. Skin: No rashes, lesions or ulcers. Psychiatry: Judgement and insight appear normal. Mood & affect appropriate.   Data Reviewed: I have personally reviewed following labs and imaging studies  CBC: Recent Labs  Lab 09/19/24 1128 09/20/24 0503 09/21/24 0335  WBC 17.4* 12.4* 13.2*  NEUTROABS 13.7* 10.3* 11.0*  HGB 11.9* 10.4* 10.5*  HCT 41.0 35.1* 35.3*  MCV 82.5 81.3 81.0  PLT 271 229 234    Basic Metabolic Panel: Recent Labs  Lab 09/19/24 1128 09/20/24 0503 09/21/24 0335  NA 137 138 134*  K 3.6 2.7* 3.5  CL 91* 95* 95*  CO2 23 28 33*  GLUCOSE 239* 137* 118*  BUN 34* 38* 39*  CREATININE 1.44* 1.25* 1.19  CALCIUM  8.9 8.3* 8.3*  MG  --  2.4  --     CBG: Recent Labs  Lab 09/20/24 1120 09/20/24 1631 09/20/24 2115 09/21/24 0251 09/21/24 0731  GLUCAP 141* 117* 166* 142* 98    Recent Results (from the past 240 hours)  Blood culture (routine x 2)     Status: None (Preliminary result)   Collection Time: 09/19/24 12:51 PM   Specimen: BLOOD RIGHT ARM  Result Value Ref Range Status   Specimen Description BLOOD RIGHT ARM  Final   Special Requests Blood Culture adequate volume  Final   Culture   Final    NO GROWTH 2  DAYS Performed at [facility], [address], [place], [place] [address]    Report Status PENDING  Incomplete  Blood culture (routine x 2)     Status: None (Preliminary result)   Collection Time: 09/19/24 12:56 PM   Specimen: Left Antecubital; Blood  Result Value Ref Range Status   Specimen Description LEFT ANTECUBITAL  Final   Special Requests Blood Culture adequate volume  Final   Culture   Final    NO GROWTH 2 DAYS Performed at [facility], [address], [place], [place] [address]    Report Status PENDING  Incomplete  MRSA Next Gen by PCR, Nasal     Status: None   Collection Time: 09/19/24  2:05 PM   Specimen: Nasal Mucosa; Nasal Swab  Result Value Ref Range Status   MRSA by PCR Next Gen NOT DETECTED NOT DETECTED Final    Comment: (NOTE) The GeneXpert MRSA Assay (FDA approved for NASAL specimens only), is one component of a comprehensive MRSA colonization surveillance program. It is not intended to diagnose MRSA infection nor to guide or monitor treatment for MRSA infections. Test performance is not FDA approved in patients less than 79 years old. Performed at [facility], [address] [place], [place] [address]     Radiology Studies: MR BRAIN WO CONTRAST Result Date: 09/20/2024 EXAM: MRI BRAIN WITHOUT CONTRAST 09/20/2024 02:20:10 PM TECHNIQUE: Multiplanar multisequence MRI of the head/brain was performed without the administration of intravenous contrast. COMPARISON: CT head 10/18/2018 CLINICAL HISTORY: Neuro deficit, acute, stroke suspected. Generalized weakness, rule out acute Cerebrovascular Accident. FINDINGS: BRAIN AND VENTRICLES: No acute infarct. No intracranial hemorrhage. No mass. No midline shift. No hydrocephalus. Mild for age chronic microvascular ischemic change. A few small remote left frontal cortical infarcts. Small remote right cerebellar infarct. Normal flow voids. ORBITS: No acute abnormality. SINUSES AND MASTOIDS: No acute abnormality. BONES AND SOFT  TISSUES: Normal  marrow signal. No acute soft tissue abnormality. IMPRESSION: 1. No acute intracranial abnormality. Electronically signed by: Gilmore Molt MD 09/20/2024 03:15 PM EST RP Workstation: [id number]   DG CHEST PORT 1 VIEW Result Date: 09/20/2024 EXAM: 1 VIEW(S) XRAY OF THE CHEST 09/20/2024 05:29:00 AM COMPARISON: 09/19/2024 CLINICAL HISTORY: 08790 Atelectasis 91209; 10031 Cough 10031 FINDINGS: LUNGS AND PLEURA: Low lung volumes. Possible left retrocardiac opacity, favored to reflect atelectasis, but difficult to exclude superimposed airspace opacity. Decreased left pleural effusion. Trace right pleural effusion. No pneumothorax. HEART AND MEDIASTINUM: Cardiomegaly. Atherosclerotic calcifications. BONES AND SOFT TISSUES: No acute osseous abnormality. IMPRESSION: 1. Decreased left pleural effusion and trace right pleural effusion. 2. Possible left retrocardiac opacity, favored to reflect atelectasis, but difficult to exclude superimposed airspace opacity. 3. Cardiomegaly and atherosclerotic calcifications. Electronically signed by: Evalene Coho MD 09/20/2024 06:05 AM EST RP Workstation: [id number]   MR LUMBAR SPINE WO CONTRAST Result Date: 09/19/2024 EXAM: MRI LUMBAR SPINE 09/19/2024 04:34:40 PM TECHNIQUE: Multiplanar multisequence MRI of the lumbar spine was performed without the administration of intravenous contrast. COMPARISON: 04/08/2020 CLINICAL HISTORY: Lumbar radiculopathy, symptoms persist with > 6 wks treatment; Myelopathy, acute, lumbar spine. FINDINGS: BONES AND ALIGNMENT: Normal alignment except for slight retrolisthesis at L2-L3 and L3-L4, which is stable. Normal vertebral body heights. Bone marrow signal is unremarkable except for extensive type 2 Modic changes at L2-L3 and L3-L4, and edematous changes that have progressed at L4-L5 and L5-S1. SPINAL CORD: The conus medullaris terminates at L1. SOFT TISSUES: No paraspinal mass. L1-L2: No significant disc herniation. No spinal canal  stenosis or neural foraminal narrowing. L2-L3: Uncovertebral broad-based disc protrusion results in stable mild foraminal narrowing bilaterally. A broad-based disc protrusion and prominent epidural fat results in moderate central stenosis. Severe left and moderate right foraminal narrowing demonstrate some progression since the prior study. L3-L4: Broad-based disc protrusion has progressed. Moderate facet hypertrophy has progressed bilaterally. 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 Progressive moderate foraminal stenosis is present bilaterally, right greater than left. L4-L5: Chronic positive disc height is present. Endplate ridging and epidural lipomatosis result in moderate central canal stenosis. Moderate-to-severe right and moderate left foraminal stenosis is stable. L5-S1: Edematous changes have progressed. No significant disc herniation. No spinal canal stenosis or neural foraminal narrowing. IMPRESSION: 1. 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 2. Moderate central canal stenosis at L2-3 from broad-based disc protrusion and prominent epidural fat. 3. Progressive moderate foraminal stenosis bilaterally at L4-5, right greater than left. 4. Severe left and moderate right foraminal narrowing at L2-3 with some progression since prior study. 5. Progression of edematous changes at L4-5 and L5-S1. 6. Progression of broad-based disc protrusion and moderate facet hypertrophy bilaterally at L4-5. Electronically signed by: Lonni Necessary MD 09/19/2024 06:28 PM EST RP Workstation: [id number]   DG Chest Port 1 View Result Date: 09/19/2024 EXAM: 1 VIEW(S) XRAY OF THE CHEST 09/19/2024 11:45:00 AM COMPARISON: 01/20/2024 CLINICAL HISTORY: shortness of breath FINDINGS: LUNGS AND PLEURA: Probable left retrocardiac opacity concerning for atelectasis or infiltrate. Possible effusion. No  pneumothorax. HEART AND MEDIASTINUM: Stable cardiomegaly. No acute abnormality of the mediastinal silhouette. BONES AND SOFT TISSUES: No acute osseous abnormality. IMPRESSION: 1. Left retrocardiac opacity suspicious for atelectasis or infiltrate, with possible effusion. 2. Stable cardiomegaly. Electronically signed by: Lynwood Seip MD 09/19/2024 12:16 PM EST RP Workstation: [id number]   DG Knee Complete 4 Views Right Result Date: 09/19/2024 EXAM: 4 OR MORE VIEW(S) XRAY OF THE RIGHT KNEE  09/19/2024 11:45:00 AM COMPARISON: None available. CLINICAL HISTORY: shortness of breath FINDINGS: BONES AND JOINTS: No acute fracture. No malalignment. Small suprapatellar joint effusion is noted. Minimal narrowing of lateral joint space is noted. SOFT TISSUES: The soft tissues are unremarkable. IMPRESSION: 1. Small suprapatellar joint effusion. 2. Minimal lateral compartment joint space narrowing. Electronically signed by: Lynwood Seip MD 09/19/2024 12:14 PM EST RP Workstation: [id number]   DG Pelvis 1-2 Views Result Date: 09/19/2024 EXAM: 1 or 2 view(s) Xray of the pelvis 09/19/2024 11:45:00 AM COMPARISON: None available. CLINICAL HISTORY: Shortness of breath FINDINGS: BONES AND JOINTS: No acute fracture. No malalignment. SOFT TISSUES: The soft tissues are unremarkable. IMPRESSION: 1. No significant abnormality. Electronically signed by: Lynwood Seip MD 09/19/2024 12:13 PM EST RP Workstation: [id number]    Scheduled Meds:  acetaminophen   650 mg Oral Q6H   Or   acetaminophen   650 mg Rectal Q6H   aspirin   81 mg Oral Daily   brimonidine   1 drop Both Eyes BID   And   timolol   1 drop Both Eyes BID   Chlorhexidine  Gluconate Cloth  6 each Topical Daily   dapagliflozin  propanediol  10 mg Oral Daily   diltiazem   30 mg Oral Q6H   dorzolamide   1 drop Both Eyes BID   enoxaparin  (LOVENOX ) injection  40 mg Subcutaneous Q24H   ferrous sulfate   325 mg Oral Q breakfast   gabapentin   300 mg Oral TID   insulin  aspart  0-9 Units  Subcutaneous TID WC   insulin  aspart  10 Units Subcutaneous TID WC   insulin  glargine-yfgn  35 Units Subcutaneous QHS   living well with diabetes book   Does not apply Once   pantoprazole   40 mg Oral QPM   torsemide   20 mg Oral BID   Continuous Infusions:  sodium chloride  Stopped (09/20/24 2323)   cefTRIAXone  (ROCEPHIN )  IV Stopped (09/20/24 1332)   doxycycline  (VIBRAMYCIN ) IV 125 mL/hr at 09/20/24 2350     LOS: 2 days   Time spent: 57 mins  Ymani Porcher Vicci, MD How to contact the [facility] Attending or Consulting provider 7A - 7P or covering provider during after hours 7P -7A, for this patient?  Check the care team in [facility] and look for a) attending/consulting [facility] provider listed and b) the [facility] team listed Log into [email or web address] to find provider on call.  Locate the [facility] provider you are looking for under [facility] and page to a number that you can be directly reached. If you still have difficulty reaching the provider, please page the [facility] (Director on Call) for the Hospitalists listed on amion for assistance.  09/21/2024, 10:12 AM

## 2024-09-21 NOTE — Progress Notes (Signed)
 Patient transported to 320 from ICU-10. All belongings (clothing, cell phone with charger, and dentures) sent with patient.

## 2024-09-22 ENCOUNTER — Inpatient Hospital Stay

## 2024-09-22 LAB — CBC WITH DIFFERENTIAL/PLATELET
Abs Immature Granulocytes: 0.06 K/uL (ref 0.00–0.07)
Basophils Absolute: 0 K/uL (ref 0.0–0.1)
Basophils Relative: 0 %
Eosinophils Absolute: 0.2 K/uL (ref 0.0–0.5)
Eosinophils Relative: 2 %
HCT: 39.8 % (ref 39.0–52.0)
Hemoglobin: 11.4 g/dL — ABNORMAL LOW (ref 13.0–17.0)
Immature Granulocytes: 1 %
Lymphocytes Relative: 13 %
Lymphs Abs: 1.3 K/uL (ref 0.7–4.0)
MCH: 23.7 pg — ABNORMAL LOW (ref 26.0–34.0)
MCHC: 28.6 g/dL — ABNORMAL LOW (ref 30.0–36.0)
MCV: 82.7 fL (ref 80.0–100.0)
Monocytes Absolute: 0.8 K/uL (ref 0.1–1.0)
Monocytes Relative: 8 %
Neutro Abs: 7.9 K/uL — ABNORMAL HIGH (ref 1.7–7.7)
Neutrophils Relative %: 76 %
Platelets: 308 K/uL (ref 150–400)
RBC: 4.81 MIL/uL (ref 4.22–5.81)
RDW: 18.1 % — ABNORMAL HIGH (ref 11.5–15.5)
WBC: 10.2 K/uL (ref 4.0–10.5)
nRBC: 0 % (ref 0.0–0.2)

## 2024-09-22 LAB — BASIC METABOLIC PANEL WITH GFR
Anion gap: 7 (ref 5–15)
BUN: 29 mg/dL — ABNORMAL HIGH (ref 8–23)
CO2: 35 mmol/L — ABNORMAL HIGH (ref 22–32)
Calcium: 8.3 mg/dL — ABNORMAL LOW (ref 8.9–10.3)
Chloride: 98 mmol/L (ref 98–111)
Creatinine, Ser: 1.18 mg/dL (ref 0.61–1.24)
GFR, Estimated: >60 mL/min (ref >60)
Glucose, Bld: 109 mg/dL — ABNORMAL HIGH (ref 70–99)
Potassium: 3.1 mmol/L — ABNORMAL LOW (ref 3.5–5.1)
Sodium: 140 mmol/L (ref 135–145)

## 2024-09-22 LAB — GLUCOSE, CAPILLARY
Glucose-Capillary: 149 mg/dL — ABNORMAL HIGH (ref 70–99)
Glucose-Capillary: 98 mg/dL (ref 70–99)
Glucose-Capillary: 112 mg/dL — ABNORMAL HIGH (ref 70–99)
Glucose-Capillary: 80 mg/dL (ref 70–99)
Glucose-Capillary: 164 mg/dL — ABNORMAL HIGH (ref 70–99)

## 2024-09-22 LAB — MAGNESIUM: Magnesium: 2.2 mg/dL (ref 1.7–2.4)

## 2024-09-22 MED ORDER — FUROSEMIDE 10 MG/ML IJ SOLN: 60.0000 mg | Freq: Once | INTRAMUSCULAR | Status: DC

## 2024-09-22 MED ORDER — POTASSIUM CHLORIDE CRYS ER 20 MEQ PO TBCR
40.0000 meq | EXTENDED_RELEASE_TABLET | Freq: Once | ORAL | Status: AC
  Administered 2024-09-22: 40 meq via ORAL
  Filled 2024-09-22: qty 2

## 2024-09-22 MED ORDER — FUROSEMIDE 10 MG/ML IJ SOLN
60.0000 mg | Freq: Once | INTRAMUSCULAR | Status: AC
  Administered 2024-09-22: 60 mg via INTRAVENOUS
  Filled 2024-09-22: qty 6

## 2024-09-22 NOTE — Progress Notes (Signed)
 Occupational Therapy Treatment Patient Details Name: Lucas Dudley MRN: [id number] DOB: 1941-03-21 Today's Date: 09/22/2024   History of present illness Lucas Dudley is a 83 year old male coronary artery disease, HFpEF, pulmonary hypertension, persistent A-fib, status post watchman implantation, hypertension, hyperlipidemia, OSA on CPAP, stage IIIa CKD, normocytic anemia, uncontrolled type 2 diabetes mellitus on insulin , statin myopathy, gout, diabetic neuropathy, chronic gastric erosion, obesity, DOE, constipation who currently lives alone.  He apparently had a fall about 4 days ago at home.  He did not seek medical treatment at the time.  He fell again 2 days ago and was not able to get up and had been on the floor for about 1-2 days.  He has been having difficulty for past few months with lower extremity weakness and his legs giving out.  They have not been able to determine the cause of this.  His daughter called EMS to check on him because she had not heard from him in a couple of days.  EMS found him to have right knee swelling.  He complained of shortness of breath and knee pain.  He has not had his medications in a couple of days.  The ED found him to be tachycardic in A-fib with RVR and was hypoxic and placed on 3 L nasal cannula.  His chest x-ray was suspicious for atelectasis and/or pneumonia.  He was started on IV diltiazem  infusion, IV antibiotics, Lasix , IV fluids.  He was noted to be tachypneic and had normal venous pH.  His blood sugar was 239.  His CK was 518.  Creatinine 1.44, GFR 48.5 mL/min and troponin 91.  White blood cell count 17.4, hemoglobin 11.9, platelets 271.  Bilirubin 2.2.  His trauma workup revealed normal pelvis imaging, right knee imaging reveals small suprapatellar joint effusion.  Admission requested for further management.   OT comments  Pt agreeable to OT treatment today. Pt seated at EOB seeming somewhat lethargic at the start of the session. Pt able to complete  several reps of sit to stand with mod A using RW. Balance improved somewhat with increased reps with standing until static balance was achieved. Pt required mod A to return to supine due to assist being needed to bring B LE into bed. Pt left in the bed with call bell within reach. Pt will benefit from continued OT in the hospital to increase strength, balance, and endurance for safe ADL's.         If plan is discharge home, recommend the following:  A lot of help with walking and/or transfers;A lot of help with bathing/dressing/bathroom;Assistance with cooking/housework;Assist for transportation;Help with stairs or ramp for entrance   Equipment Recommendations  None recommended by OT          Precautions / Restrictions Precautions Precautions: Fall Recall of Precautions/Restrictions: Intact Restrictions Weight Bearing Restrictions Per Provider Order: No       Mobility Bed Mobility Overal bed mobility: Needs Assistance Bed Mobility: Sit to Supine       Sit to supine: Mod assist   General bed mobility comments: Pt required assist to bring B LE into bed. Pt seated at the EOB to start the seession.    Transfers Overall transfer level: Needs assistance Equipment used: Rolling walker (2 wheels) Transfers: Sit to/from Stand Sit to Stand: Mod assist           General transfer comment: Sit to stand from EOB with RW.     Balance Overall balance assessment: Needs assistance Sitting-balance support:  No upper extremity supported, Feet supported Sitting balance-Leahy Scale: Fair Sitting balance - Comments: seated at EOB   Standing balance support: Bilateral upper extremity supported, During functional activity, Reliant on assistive device for balance Standing balance-Leahy Scale: Poor Standing balance comment: Pt often had a slight posterior lean when first standing from the bed with the RW.                           ADL either performed or assessed with clinical  judgement   ADL Overall ADL's : Needs assistance/impaired                                       General ADL Comments: Session focused on functional sit to stand for toilet transfers. Pt able to take a couple lateral steps to L side and was able to march in place for ~2 marches.    Extremity/Trunk Assessment                               Communication Communication Communication: No apparent difficulties   Cognition Arousal: Lethargic Behavior During Therapy: WFL for tasks assessed/performed Cognition: No apparent impairments             OT - Cognition Comments: Mildly lethargic.                 Following commands: Intact        Cueing   Cueing Techniques: Verbal cues  Exercises Exercises: Other exercises Other Exercises Other Exercises: ~x3 consecutive reps of sit to stand with one rest break followed by one for sit to stand. Mod A needed with use of RW.           General Comments Trialed pt off supplemental O2, but noted to desaturate to ~87% after first bout of sit to stand from EOB. Pt placed back on 2.5 LPM of supplemental O2 with saturation >90%.    Pertinent Vitals/ Pain       Pain Assessment Pain Assessment: 0-10 Pain Score: 5  Pain Location: R knee Pain Descriptors / Indicators: Stabbing Pain Intervention(s): Monitored during session, Repositioned                                                          Frequency  Min 2X/week        Progress Toward Goals  OT Goals(current goals can now be found in the care plan section)  Progress towards OT goals: Progressing toward goals  Acute Rehab OT Goals Patient Stated Goal: Improve function. OT Goal Formulation: With patient Time For Goal Achievement: 10/05/24 Potential to Achieve Goals: Good ADL Goals Pt Will Perform Grooming: with modified independence Pt Will Perform Lower Body Bathing: with modified independence Pt Will Perform Lower  Body Dressing: with modified independence Pt Will Transfer to Toilet: with modified independence Pt Will Perform Toileting - Clothing Manipulation and hygiene: with modified independence Pt/caregiver will Perform Home Exercise Program: Increased strength;Both right and left upper extremity;Independently  Plan  End of Session Equipment Utilized During Treatment: Gait belt;Rolling walker (2 wheels);Oxygen  OT Visit Diagnosis: Unsteadiness on feet (R26.81);Other abnormalities of gait and mobility (R26.89);Muscle weakness (generalized) (M62.81);History of falling (Z91.81)   Activity Tolerance Patient tolerated treatment well   Patient Left with call bell/phone within reach;in bed             Time: 1451-1517 OT Time Calculation (min): 26 min  Charges: OT General Charges $OT Visit: 1 Visit OT Treatments $Therapeutic Activity: 23-37 mins  Icelyn Navarrete OT, MOT   Jayson Person 09/22/2024, 3:53 PM

## 2024-09-22 NOTE — TOC Progression Note (Addendum)
 Transition of Care [facility]) - Progression Note    Patient Details  Name: ATARI NOVICK MRN: [id number] Date of Birth: 1941/03/21  Transition of Care [facility]) CM/SW Contact  Sharlyne Stabs, RN Phone Number: 09/22/2024, 10:21 AM  Clinical Narrative:   Discussed bed offer with Jon, She accepted [facility], they called her yesterday and she has signed consent. CMA adding to Auth. Cardiology is following patient added IV lasix  today, will reassess tomorrow. IPCM following.    Addendum BETHA Barrows approved 12/9 0 12/11, mayme barrows id [id number]    Expected Discharge Plan: Skilled Nursing Facility Barriers to Discharge: Continued Medical Work up   Expected Discharge Plan and Services     Post Acute Care Choice: Home Health Living arrangements for the past 2 months: Single Family Home                    Social Drivers of Health (SDOH) Interventions SDOH Screenings   Food Insecurity: No Food Insecurity (09/19/2024)  Housing: Low Risk  (09/19/2024)  Transportation Needs: No Transportation Needs (09/19/2024)  Utilities: Not At Risk (09/19/2024)  Alcohol Screen: Low Risk  (01/27/2024)  Depression (PHQ2-9): Low Risk  (09/16/2024)  Financial Resource Strain: Low Risk  (02/10/2024)  Physical Activity: Inactive (02/10/2024)  Social Connections: Moderately Integrated (09/19/2024)  Stress: No Stress Concern Present (02/10/2024)  Tobacco Use: Medium Risk (09/20/2024)  Health Literacy: Adequate Health Literacy (11/20/2023)    Readmission Risk Interventions    01/28/2024    1:28 PM 01/14/2024   10:05 AM 12/17/2023   10:19 AM  Readmission Risk Prevention Plan  Transportation Screening Complete Complete Complete  Home Care Screening   Complete  Medication Review (RN CM)   Complete  HRI or Home Care Consult  Complete   Social Work Consult for Recovery Care Planning/Counseling  Complete   Palliative Care Screening  Not Applicable   Medication Review Oceanographer) Referral to Pharmacy Complete   PCP or  Specialist appointment within 3-5 days of discharge Complete    HRI or Home Care Consult Complete    SW Recovery Care/Counseling Consult Complete    Palliative Care Screening Not Applicable    Skilled Nursing Facility Not Applicable

## 2024-09-22 NOTE — Progress Notes (Signed)
 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  He apparently had a fall about 4 days ago at home.  He did not seek medical treatment at the time.  He fell again 2 days ago and was not able to get up and had been on the floor for about 1-2 days.  He has been having difficulty for past few months with lower extremity weakness and his legs giving out.  They have not been able to determine the cause of this.  His daughter called EMS to check on him because she had not heard from him in a couple of days.  EMS found him to have right knee swelling.  He complained of shortness of breath and knee pain.  He has not had his medications in a couple of days.  The ED found him to be tachycardic in A-fib with RVR and was hypoxic and placed on 3 L nasal cannula.  His chest x-ray was suspicious for atelectasis and/or pneumonia.  He was started on IV diltiazem  infusion, IV antibiotics, Lasix , IV fluids.  He was noted to be tachypneic and had normal venous pH.  His blood sugar was 239.  His CK was 518.  Creatinine 1.44, GFR 48.5 mL/min and troponin 91.  White blood cell count 17.4, hemoglobin 11.9, platelets 271.  Bilirubin 2.2.  His trauma workup revealed normal pelvis imaging, right knee imaging reveals small suprapatellar joint effusion.  Admission requested for further management.   Assessment and Plan:  Atrial fibrillation with RVR -- he was started on IV diltiazem  infusion in ED --  admitted to stepdown ICU --- transfer to telemetry bed  -- weaned off diltiazem  infusion to oral Cardizem  30 mg every 6 hours  -- he is s/p watchman device as he could not tolerate full anticoagulation -- he previously had controlled rate Afib without AV nodal agent per cardiology notes   Persistent atrial fibrillation  -- he is no longer on antiarrythymic therapy -- his HR had been controlled without AV nodal blockers -- follow for now and decide if he will require them after he recovers from acute illness   Presumed aspiration pneumonia Sepsis ruled out  -- could just be atelectasis on CXR but given he was down on floor for prolonged time he likely did aspirate  -- added incentive spirometry -- IV antibiotics  -- repeat CXR showing improvement    Generalized weakness Adult failure to thrive  Lumbar radiculopathy with leg weakness -- discussed with daughter symptoms of leg weakness progressing and requested MRI L spine -- if patient can tolerate will try to obtain MRI L spine without contrast  -- MRI with findings of 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  Will obtain PT evaluation.  MRI brain ordered to rule out acute CVA -- MRI brain negative for CVA -- Pt recommending SNF and patient is agreeable. TOC consulted.     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 -- resume basal and prandial insulin  -- SSI coverage and frequent CBG monitoring ordered -- updated A1c 11.1%  CBG (last 3)  Recent Labs    09/22/24 0303 09/22/24 0723 09/22/24 1128  GLUCAP 149* 98 112*    Acute urinary retention -- I/O caths done in ED -- temporary foley cath placement ordered -- tamsulosin 0.4 mg daily  -- removed foley 12/8 and he has been voiding    Essential hypertension  -- weaned off IV diltiazem  infusion to oral diltiazem  30 mg every 6 hours     Hyperlipidemia -- he is reportedly intolerent to statins  --  cardiology had started him on zetia  daily previously but he now says he  doesn't take it    Stage 3a CKD -- stable, follow    OSA on CPAP -- nightly CPAP ordered   Iron  deficiency anemia -- he is followed by hematology for this  -- he is no longer on octreotide  injections -- he is receiving IV iron  infusions.  He received IV iron  12/8 in hospital   CAD -- recent cath demonstrated 40% proximal to mid RCA stenosis and 60% PDA stenosis managed with medical therapy by cardiology.  -- he is on aspirin  81 mg daily   Chronic HFpEF -- resumed home torsemide  20 mg BID -- currently on hold while on IV furosemide  -- ReDs vest ordered by cardiology team is 40 suggesting pulmonary edema  -- cardiology team started him on IV furosemide  injection  -- monitor response to IV furosemide   DVT prophylaxis: enoxaparin   Code Status: Full  Family Communication: daughter on phone 12/6, bedside update 12/7  Disposition: anticipate SNF rehab    Consultants:  cardiology Procedures:   Antimicrobials:  Anti-infectives (From admission, onward)    Start     Dose/Rate Route Frequency Ordered Stop   09/21/24 1200  doxycycline  (VIBRA -TABS) tablet 100 mg        100 mg Oral Every 12 hours 09/21/24 1019 09/24/24 0959   09/20/24 1300  cefTRIAXone  (ROCEPHIN ) 2 g in sodium chloride  0.9 % 100 mL IVPB        2 g 200 mL/hr over 30 Minutes Intravenous Every 24 hours 09/19/24 1805 09/24/24 1259   09/20/24 1100  doxycycline  (VIBRAMYCIN ) 100 mg in sodium chloride  0.9 % 250 mL IVPB  Status:  Discontinued        100 mg 125 mL/hr over 120 Minutes Intravenous Every 12 hours 09/20/24 0918 09/21/24 1018   09/19/24 1245  cefTRIAXone  (ROCEPHIN ) 2 g in sodium chloride  0.9 % 100 mL IVPB        2 g 200 mL/hr over 30 Minutes Intravenous  Once 09/19/24 1231 09/19/24 1342   09/19/24 1245  azithromycin  (ZITHROMAX ) 500 mg in sodium chloride  0.9 % 250 mL IVPB  Status:  Discontinued        500 mg 250 mL/hr over 60 Minutes Intravenous Every 24 hours 09/19/24 1231 09/20/24 0918        Subjective: Pt  having more SOB today.  He says he tolerated the IV iron  infusion. Reports a little less weakness in Right leg after working more with PT   Objective: Vitals:   09/22/24 0419 09/22/24 0438 09/22/24 0927 09/22/24 1212  BP:  132/80  113/69  Pulse:  (!) 55  70  Resp:  19 20 (!) 21  Temp:  97.7 F (36.5 C)  98.5 F (36.9 C)  TempSrc:  Oral  Oral  SpO2:  95%  97%  Weight: 103.6 kg     Height:        Intake/Output Summary (Last 24 hours) at 09/22/2024 1415  Last data filed at 09/22/2024 1003 Gross per 24 hour  Intake 340 ml  Output 1700 ml  Net -1360 ml   Filed Weights   09/21/24 1238 09/22/24 0257 09/22/24 0419  Weight: 109.3 kg 108.9 kg 103.6 kg   Examination:  General exam: Appears calm and comfortable  Respiratory system: crackles heard.  Cardiovascular system: normal S1 & S2 heard. No JVD, murmurs, rubs, gallops or clicks. No pedal edema. Gastrointestinal system: Abdomen is nondistended, soft and nontender. No organomegaly or masses felt. Normal bowel sounds heard. Central nervous system: Alert and oriented. 4/5 weakness RLE Extremities: 1+ edema BLEs.  Symmetric 5 x 5 power. Skin: No rashes, lesions or ulcers. Psychiatry: Judgement and insight appear normal. Mood & affect appropriate.   Data Reviewed: I have personally reviewed following labs and imaging studies  CBC: Recent Labs  Lab 09/19/24 1128 09/20/24 0503 09/21/24 0335 09/22/24 0448  WBC 17.4* 12.4* 13.2* 10.2  NEUTROABS 13.7* 10.3* 11.0* 7.9*  HGB 11.9* 10.4* 10.5* 11.4*  HCT 41.0 35.1* 35.3* 39.8  MCV 82.5 81.3 81.0 82.7  PLT 271 229 234 308    Basic Metabolic Panel: Recent Labs  Lab 09/19/24 1128 09/20/24 0503 09/21/24 0335 09/22/24 0448  NA 137 138 134* 140  K 3.6 2.7* 3.5 3.1*  CL 91* 95* 95* 98  CO2 23 28 33* 35*  GLUCOSE 239* 137* 118* 109*  BUN 34* 38* 39* 29*  CREATININE 1.44* 1.25* 1.19 1.18  CALCIUM  8.9 8.3* 8.3* 8.3*  MG  --  2.4  --  2.2    CBG: Recent Labs  Lab  09/21/24 1641 09/21/24 2004 09/22/24 0303 09/22/24 0723 09/22/24 1128  GLUCAP 116* 260* 149* 98 112*    Recent Results (from the past 240 hours)  Blood culture (routine x 2)     Status: None (Preliminary result)   Collection Time: 09/19/24 12:51 PM   Specimen: BLOOD RIGHT ARM  Result Value Ref Range Status   Specimen Description BLOOD RIGHT ARM  Final   Special Requests Blood Culture adequate volume  Final   Culture   Final    NO GROWTH 3 DAYS Performed at [facility], [address], [place], [place] [address]    Report Status PENDING  Incomplete  Blood culture (routine x 2)     Status: None (Preliminary result)   Collection Time: 09/19/24 12:56 PM   Specimen: Left Antecubital; Blood  Result Value Ref Range Status   Specimen Description LEFT ANTECUBITAL  Final   Special Requests Blood Culture adequate volume  Final   Culture   Final    NO GROWTH 3 DAYS Performed at [facility], [address], [place], [place] [address]    Report Status PENDING  Incomplete  MRSA Next Gen by PCR, Nasal     Status: None   Collection Time: 09/19/24  2:05 PM   Specimen: Nasal Mucosa; Nasal Swab  Result Value Ref Range Status   MRSA by PCR Next Gen NOT DETECTED NOT DETECTED Final    Comment: (NOTE) The GeneXpert MRSA Assay (FDA approved for NASAL specimens only), is one component of a comprehensive MRSA colonization surveillance program. It is not intended to diagnose MRSA infection nor to guide or monitor treatment for MRSA infections. Test performance is not FDA approved in patients less than 43 years old. Performed at [facility], [address], [place], [place] [address]     Radiology Studies: MR BRAIN WO CONTRAST Result Date: 09/20/2024 EXAM: MRI BRAIN WITHOUT CONTRAST 09/20/2024  02:20:10 PM TECHNIQUE: Multiplanar multisequence MRI of the head/brain was performed without the administration of intravenous contrast. COMPARISON: CT head 10/18/2018 CLINICAL HISTORY: Neuro deficit, acute,  stroke suspected. Generalized weakness, rule out acute Cerebrovascular Accident. FINDINGS: BRAIN AND VENTRICLES: No acute infarct. No intracranial hemorrhage. No mass. No midline shift. No hydrocephalus. Mild for age chronic microvascular ischemic change. A few small remote left frontal cortical infarcts. Small remote right cerebellar infarct. Normal flow voids. ORBITS: No acute abnormality. SINUSES AND MASTOIDS: No acute abnormality. BONES AND SOFT TISSUES: Normal marrow signal. No acute soft tissue abnormality. IMPRESSION: 1. No acute intracranial abnormality. Electronically signed by: Gilmore Molt MD 09/20/2024 03:15 PM EST RP Workstation: [id number]    Scheduled Meds:  acetaminophen   650 mg Oral Q6H   Or   acetaminophen   650 mg Rectal Q6H   aspirin   81 mg Oral Daily   brimonidine   1 drop Both Eyes BID   And   timolol   1 drop Both Eyes BID   Chlorhexidine  Gluconate Cloth  6 each Topical Daily   dapagliflozin  propanediol  10 mg Oral Daily   diltiazem   30 mg Oral Q6H   dorzolamide   1 drop Both Eyes BID   doxycycline   100 mg Oral Q12H   enoxaparin  (LOVENOX ) injection  40 mg Subcutaneous Q24H   furosemide   60 mg Intravenous Once   gabapentin   300 mg Oral TID   insulin  aspart  0-9 Units Subcutaneous TID WC   insulin  aspart  8 Units Subcutaneous TID WC   insulin  glargine-yfgn  30 Units Subcutaneous QHS   pantoprazole   40 mg Oral QPM   Continuous Infusions:  cefTRIAXone  (ROCEPHIN )  IV 2 g (09/22/24 1216)     LOS: 3 days   Time spent: 55 mins  Yarelis Ambrosino Vicci, MD How to contact the [facility]H Attending or Consulting provider 7A - 7P or covering provider during after hours 7P -7A, for this patient?  Check the care team in [facility] and look for a) attending/consulting TRH provider listed and b) the TRH team listed Log into [email or web address] to find provider on call.  Locate the [facility] provider you are looking for under [facility] and page to a number that you can be directly reached. If you  still have difficulty reaching the provider, please page the [facility] (Director on Call) for the Hospitalists listed on amion for assistance.  09/22/2024, 2:15 PM

## 2024-09-22 NOTE — Progress Notes (Signed)
 This AM patient was more awake and interactive. Throughout day patient began to get increasingly tired. He is easily stimulated. Daughter visited today and is concerned that his gabapentin  is making him mor sleepy. She is also concerned that his bulging discs could be contributing to urination issues. Pt was transferred to [facility] with 2 person assist. He is very weak and requires much coaching to transfer appropriately. Kellogg RN

## 2024-09-22 NOTE — Progress Notes (Signed)
 Pt constantly taking off home CPAP throughout the shift. Able to reorient pt each time but is still taking it off. Each time O2 is dropping. Pt is on 3L Acute with CPAP, able to get O2 in the 90s. Pt also had a 10 beat run of Vtach, this was address by MD.

## 2024-09-22 NOTE — Progress Notes (Signed)
 Rounding Note   Patient Name: Lucas Dudley Date of Encounter: 09/22/2024  [facility] Cardiologist: [name], MD   Subjective Some ongoing SOB  Scheduled Meds:  acetaminophen   650 mg Oral Q6H   Or   acetaminophen   650 mg Rectal Q6H   aspirin   81 mg Oral Daily   brimonidine   1 drop Both Eyes BID   And   timolol   1 drop Both Eyes BID   Chlorhexidine  Gluconate Cloth  6 each Topical Daily   dapagliflozin  propanediol  10 mg Oral Daily   diltiazem   30 mg Oral Q6H   dorzolamide   1 drop Both Eyes BID   doxycycline   100 mg Oral Q12H   enoxaparin  (LOVENOX ) injection  40 mg Subcutaneous Q24H   gabapentin   300 mg Oral TID   insulin  aspart  0-9 Units Subcutaneous TID WC   insulin  aspart  8 Units Subcutaneous TID WC   insulin  glargine-yfgn  30 Units Subcutaneous QHS   pantoprazole   40 mg Oral QPM   torsemide   20 mg Oral BID   Continuous Infusions:  cefTRIAXone  (ROCEPHIN )  IV Stopped (09/21/24 1232)   PRN Meds: bisacodyl , fentaNYL  (SUBLIMAZE ) injection, Influenza vac split trivalent PF, ipratropium-albuterol , LORazepam , ondansetron  **OR** ondansetron  (ZOFRAN ) IV, mouth rinse, oxyCODONE , pneumococcal 20-valent conjugate vaccine, traZODone    Vital Signs  Vitals:   09/22/24 0105 09/22/24 0257 09/22/24 0419 09/22/24 0438  BP: 120/63 138/64  132/80  Pulse: 74 89  (!) 55  Resp:  18  19  Temp: 97.8 F (36.6 C) 98.7 F (37.1 C)  97.7 F (36.5 C)  TempSrc: Oral Oral  Oral  SpO2: 92% 94%  95%  Weight:  108.9 kg 103.6 kg   Height:        Intake/Output Summary (Last 24 hours) at 09/22/2024 0855 Last data filed at 09/22/2024 0500 Gross per 24 hour  Intake 100 ml  Output 1800 ml  Net -1700 ml      09/22/2024    4:19 AM 09/22/2024    2:57 AM 09/21/2024   12:38 PM  Last 3 Weights  Weight (lbs) 228 lb 6.4 oz 240 lb 1.3 oz 240 lb 15.4 oz  Weight (kg) 103.602 kg 108.9 kg 109.3 kg      Telemetry Rate controlled afib. Rare short afib with aberrancy episodes -  Personally Reviewed  ECG  N/a - Personally Reviewed  Physical Exam  GEN: No acute distress.   Neck: difficult to visualize JVD Cardiac: irreg Respiratory: crackles bilatearlly GI: Soft, nontender, non-distended  MS: 2+ bilateral LE edema Neuro:  Nonfocal  Psych: Normal affect   Labs High Sensitivity Troponin:  No results for input(s): TROPONINIHS in the last 720 hours.   Chemistry Recent Labs  Lab 09/19/24 1128 09/20/24 0503 09/21/24 0335 09/22/24 0448  NA 137 138 134* 140  K 3.6 2.7* 3.5 3.1*  CL 91* 95* 95* 98  CO2 23 28 33* 35*  GLUCOSE 239* 137* 118* 109*  BUN 34* 38* 39* 29*  CREATININE 1.44* 1.25* 1.19 1.18  CALCIUM  8.9 8.3* 8.3* 8.3*  MG  --  2.4  --  2.2  PROT 7.1 5.9* 6.0*  --   ALBUMIN 3.6 3.0* 3.0*  --   AST 49* 28 27  --   ALT 14 13 13   --   ALKPHOS 107 83 98  --   BILITOT 2.2* 1.0 0.8  --   GFRNONAA 49* 58* >60 >60  ANIONGAP 23* 15 6 7     Lipids  No results for input(s): CHOL, TRIG, HDL, LABVLDL, LDLCALC, CHOLHDL in the last 168 hours.  Hematology Recent Labs  Lab 09/20/24 0503 09/21/24 0335 09/22/24 0448  WBC 12.4* 13.2* 10.2  RBC 4.32 4.36 4.81  HGB 10.4* 10.5* 11.4*  HCT 35.1* 35.3* 39.8  MCV 81.3 81.0 82.7  MCH 24.1* 24.1* 23.7*  MCHC 29.6* 29.7* 28.6*  RDW 17.7* 17.9* 18.1*  PLT 229 234 308   Thyroid  No results for input(s): TSH, FREET4 in the last 168 hours.  BNP Recent Labs  Lab 09/19/24 1128  PROBNP 4,082.0*    DDimer No results for input(s): DDIMER in the last 168 hours.   Radiology  MR BRAIN WO CONTRAST Result Date: 09/20/2024 EXAM: MRI BRAIN WITHOUT CONTRAST 09/20/2024 02:20:10 PM TECHNIQUE: Multiplanar multisequence MRI of the head/brain was performed without the administration of intravenous contrast. COMPARISON: CT head 10/18/2018 CLINICAL HISTORY: Neuro deficit, acute, stroke suspected. Generalized weakness, rule out acute Cerebrovascular Accident. FINDINGS: BRAIN AND VENTRICLES: No acute infarct. No  intracranial hemorrhage. No mass. No midline shift. No hydrocephalus. Mild for age chronic microvascular ischemic change. A few small remote left frontal cortical infarcts. Small remote right cerebellar infarct. Normal flow voids. ORBITS: No acute abnormality. SINUSES AND MASTOIDS: No acute abnormality. BONES AND SOFT TISSUES: Normal marrow signal. No acute soft tissue abnormality. IMPRESSION: 1. No acute intracranial abnormality. Electronically signed by: Gilmore Molt MD 09/20/2024 03:15 PM EST RP Workstation: [id number]     Patient Profile   KDEN WAGSTER is a 83 y.o. 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   Assessment & Plan   1. PAF - has watchman device - RVR on admission. Was found down at home after fall not able to get up, did not get his meds 1-2 days at home.  - RVR in setting of not getting meds, systemic stress from fall as well as possible pneumonia.   - initially on dilt drip, now off. Currently on oral dilt 30mg  every 6 hours.  - rates well controlled on current regimen. Of note athome was self rate controlled.  - continue short acting dilt at this time and follow rates. - short irregular wide complex rhythms up to 10 beats are afib with aberrancy as opposed to NSVT   2.Pneumonia - abx per primary team   3.Chronic HFpEF - 12/2023 echo: LVEF 55-60%, indet diastolic, mild RV dysfunction, severe pulm HTN PASP 63, dilated fixed IVC,  01/2024 limited echo: LVEF 55-60%, no WMAs, indet diastolic function, mild RV dysfunction, mod pulm HTN PASP 47.   Elevated proBNP, CXR without clear edema. Has chronic LE edema. Body habitus somewhat difficult to assess volume status otherwise - reds vest was 40% this AM.  - sorting out with nursing bed vs standing weights for him to get more accurate assessment.   -  remains volume overloaded, contributing to ongoing SOB and O2 requirement. Got oral torsemide  20mg  this AM already, will stop oral regimen and will dose IV lasix  60mg  as evening dose and reassess tomorrow.        4.CAD -01/2024 cath: mod nonobstructive CAD, mean PA 32, PCWP 16, PVR 2.7, CI 2.59 - no acute issues     5. Pulmonary HTN - 12/2023 echo: LVEF 55-60%, indet diastolic, mild RV dysfunction, severe pulm HTN PASP 63, dilated fixed IVC,  -  has history of OSA, HFpEF. Unclear if any additional etiologies. 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    6. Pneumonia - per primary team  For questions or updates, please contact [facility] Please consult [email or web address] for contact info under       Signed, Alvan Carrier, MD  09/22/2024, 8:55 AM

## 2024-09-22 NOTE — Progress Notes (Signed)
 Mobility Specialist Progress Note:    09/22/24 0940  Mobility  Activity Pivoted/transferred to/from BSC  Level of Assistance Moderate assist, patient does 50-74% (+2)  Assistive Device None  Distance Ambulated (ft) 3 ft  Range of Motion/Exercises Active;All extremities  Activity Response Tolerated well  Mobility Referral Yes  Mobility visit 1 Mobility  Mobility Specialist Start Time (ACUTE ONLY) 0940  Mobility Specialist Stop Time (ACUTE ONLY) 1000  Mobility Specialist Time Calculation (min) (ACUTE ONLY) 20 min   Pt received in bed, NT requesting assistance transferring to [facility]. Required ModA +2 to stand and transfer with no AD. Tolerated well, BLE weakness. NT in room, all needs met.  Jamerius Boeckman Mobility Specialist Please contact via Special Educational Needs Teacher or  Rehab office at [phone]

## 2024-09-22 NOTE — Progress Notes (Signed)
 Mobility Specialist Progress Note:    09/22/24 1030  Mobility  Activity Pivoted/transferred to/from BSC  Level of Assistance Moderate assist, patient does 50-74% (+2)  Assistive Device None  Distance Ambulated (ft) 3 ft  Range of Motion/Exercises Active;All extremities  Activity Response Tolerated well  Mobility Referral Yes  Mobility visit 1 Mobility  Mobility Specialist Start Time (ACUTE ONLY) 1030  Mobility Specialist Stop Time (ACUTE ONLY) 1045  Mobility Specialist Time Calculation (min) (ACUTE ONLY) 15 min   Pt received on BSC, NT requesting assistance transferring back to bed. Required ModA+2 to stand and transfer with no AD. Tolerated well, BLE weakness. NT in room. All needs met.   Cheikh Bramble Mobility Specialist Please contact via Special Educational Needs Teacher or  Rehab office at [phone]

## 2024-09-23 DIAGNOSIS — E1165 Type 2 diabetes mellitus with hyperglycemia: Secondary | ICD-10-CM | POA: Diagnosis not present

## 2024-09-23 DIAGNOSIS — Z794 Long term (current) use of insulin: Secondary | ICD-10-CM | POA: Diagnosis not present

## 2024-09-23 LAB — CBC WITH DIFFERENTIAL/PLATELET
Abs Immature Granulocytes: 0.04 K/uL (ref 0.00–0.07)
Basophils Absolute: 0.1 K/uL (ref 0.0–0.1)
Basophils Relative: 0 %
Eosinophils Absolute: 0.4 K/uL (ref 0.0–0.5)
Eosinophils Relative: 4 %
HCT: 39.5 % (ref 39.0–52.0)
Hemoglobin: 11.1 g/dL — ABNORMAL LOW (ref 13.0–17.0)
Immature Granulocytes: 0 %
Lymphocytes Relative: 12 %
Lymphs Abs: 1.3 K/uL (ref 0.7–4.0)
MCH: 23.7 pg — ABNORMAL LOW (ref 26.0–34.0)
MCHC: 28.1 g/dL — ABNORMAL LOW (ref 30.0–36.0)
MCV: 84.2 fL (ref 80.0–100.0)
Monocytes Absolute: 0.8 K/uL (ref 0.1–1.0)
Monocytes Relative: 7 %
Neutro Abs: 8.6 K/uL — ABNORMAL HIGH (ref 1.7–7.7)
Neutrophils Relative %: 77 %
Platelets: 363 K/uL (ref 150–400)
RBC: 4.69 MIL/uL (ref 4.22–5.81)
RDW: 18.5 % — ABNORMAL HIGH (ref 11.5–15.5)
WBC: 11.2 K/uL — ABNORMAL HIGH (ref 4.0–10.5)
nRBC: 0 % (ref 0.0–0.2)

## 2024-09-23 LAB — BASIC METABOLIC PANEL WITH GFR
Anion gap: 5 (ref 5–15)
BUN: 24 mg/dL — ABNORMAL HIGH (ref 8–23)
CO2: 37 mmol/L — ABNORMAL HIGH (ref 22–32)
Calcium: 8.6 mg/dL — ABNORMAL LOW (ref 8.9–10.3)
Chloride: 100 mmol/L (ref 98–111)
Creatinine, Ser: 1.25 mg/dL — ABNORMAL HIGH (ref 0.61–1.24)
GFR, Estimated: 58 mL/min — ABNORMAL LOW (ref >60)
Glucose, Bld: 153 mg/dL — ABNORMAL HIGH (ref 70–99)
Potassium: 3.8 mmol/L (ref 3.5–5.1)
Sodium: 141 mmol/L (ref 135–145)

## 2024-09-23 LAB — GLUCOSE, CAPILLARY
Glucose-Capillary: 230 mg/dL — ABNORMAL HIGH (ref 70–99)
Glucose-Capillary: 160 mg/dL — ABNORMAL HIGH (ref 70–99)
Glucose-Capillary: 144 mg/dL — ABNORMAL HIGH (ref 70–99)
Glucose-Capillary: 88 mg/dL (ref 70–99)
Glucose-Capillary: 187 mg/dL — ABNORMAL HIGH (ref 70–99)

## 2024-09-23 MED ORDER — INSULIN ASPART 100 UNIT/ML IJ SOLN
4.0000 [IU] | Freq: Three times a day (TID) | INTRAMUSCULAR | Status: DC
  Administered 2024-09-24 (×2): 4 [IU] via SUBCUTANEOUS
  Filled 2024-09-23: qty 1

## 2024-09-23 MED ORDER — FUROSEMIDE 10 MG/ML IJ SOLN
60.0000 mg | Freq: Two times a day (BID) | INTRAMUSCULAR | Status: DC
  Administered 2024-09-23 – 2024-09-24 (×3): 60 mg via INTRAVENOUS
  Filled 2024-09-23 (×3): qty 6

## 2024-09-23 MED ORDER — ZINC OXIDE 40 % EX OINT
TOPICAL_OINTMENT | Freq: Two times a day (BID) | CUTANEOUS | Status: DC
  Administered 2024-09-24 – 2024-09-27 (×4): 1 via TOPICAL
  Filled 2024-09-23: qty 57

## 2024-09-23 MED ORDER — INSULIN GLARGINE 100 UNIT/ML ~~LOC~~ SOLN
30.0000 [IU] | Freq: Every day | SUBCUTANEOUS | Status: DC
  Administered 2024-09-23 – 2024-09-28 (×6): 30 [IU] via SUBCUTANEOUS
  Filled 2024-09-23 (×7): qty 0.3

## 2024-09-23 MED ORDER — GABAPENTIN 300 MG PO CAPS: 300.0000 mg | ORAL_CAPSULE | Freq: Every day | ORAL | Status: DC

## 2024-09-23 NOTE — Plan of Care (Signed)
   Problem: Activity: Goal: Risk for activity intolerance will decrease Outcome: Progressing   Problem: Coping: Goal: Level of anxiety will decrease Outcome: Progressing

## 2024-09-23 NOTE — Progress Notes (Signed)
 Occupational Therapy Treatment Patient Details Name: Lucas Dudley MRN: [id number] DOB: Mar 11, 1941 Today's Date: 09/23/2024   History of present illness [name] is a 83 year old male coronary artery disease, HFpEF, pulmonary hypertension, persistent A-fib, status post watchman implantation, hypertension, hyperlipidemia, OSA on CPAP, stage IIIa CKD, normocytic anemia, uncontrolled type 2 diabetes mellitus on insulin , statin myopathy, gout, diabetic neuropathy, chronic gastric erosion, obesity, DOE, constipation who currently lives alone.  He apparently had a fall about 4 days ago at home.  He did not seek medical treatment at the time.  He fell again 2 days ago and was not able to get up and had been on the floor for about 1-2 days.  He has been having difficulty for past few months with lower extremity weakness and his legs giving out.  They have not been able to determine the cause of this.  His daughter called EMS to check on him because she had not heard from him in a couple of days.  EMS found him to have right knee swelling.  He complained of shortness of breath and knee pain.  He has not had his medications in a couple of days.  The ED found him to be tachycardic in A-fib with RVR and was hypoxic and placed on 3 L nasal cannula.  His chest x-ray was suspicious for atelectasis and/or pneumonia.  He was started on IV diltiazem  infusion, IV antibiotics, Lasix , IV fluids.  He was noted to be tachypneic and had normal venous pH.  His blood sugar was 239.  His CK was 518.  Creatinine 1.44, GFR 48.5 mL/min and troponin 91.  White blood cell count 17.4, hemoglobin 11.9, platelets 271.  Bilirubin 2.2.  His trauma workup revealed normal pelvis imaging, right knee imaging reveals small suprapatellar joint effusion.  Admission requested for further management.   OT comments  Pt agreeable to OT treatment. Pt seated in the chair to start the session. Mod A for sit to stand with RW. Min to mod A with RW to  functional ambulate beside the bed for ~20 feet. Pt then tolerated B UE strengthening, but required physical assist to reach full range for most movements. Pt left in the chair with call bell within reach. Pt will benefit from continued OT in the hospital to increase strength, balance, and endurance for safe ADL's.         If plan is discharge home, recommend the following:  A lot of help with walking and/or transfers;A lot of help with bathing/dressing/bathroom;Assistance with cooking/housework;Assist for transportation;Help with stairs or ramp for entrance   Equipment Recommendations  None recommended by OT          Precautions / Restrictions Precautions Precautions: Fall Recall of Precautions/Restrictions: Intact Restrictions Weight Bearing Restrictions Per Provider Order: No       Mobility Bed Mobility               General bed mobility comments: Pt seated in the chair to start the session.    Transfers Overall transfer level: Needs assistance Equipment used: Rolling walker (2 wheels) Transfers: Sit to/from Stand             General transfer comment: Sit to stand from recliner with mod A using RW. Sit to stand followed by functional ambulation as listed above.     Balance Overall balance assessment: Needs assistance Sitting-balance support: Bilateral upper extremity supported, Feet supported Sitting balance-Leahy Scale: Fair Sitting balance - Comments: Fair to good seated in the  recliner.   Standing balance support: Bilateral upper extremity supported, During functional activity, Reliant on assistive device for balance Standing balance-Leahy Scale: Poor Standing balance comment: poor to fair with RW                           ADL either performed or assessed with clinical judgement   ADL Overall ADL's : Needs assistance/impaired                                     Functional mobility during ADLs: Minimal assistance;Moderate  assistance;Rolling walker (2 wheels) General ADL Comments: Pt able to ambulate a total ~20 feet going forward and backward at bedside with RW.     Communication Communication Communication: No apparent difficulties   Cognition Arousal: Lethargic Behavior During Therapy: WFL for tasks assessed/performed Cognition: No apparent impairments             OT - Cognition Comments: Mildly lethargic at times.                 Following commands: Intact        Cueing   Cueing Techniques: Verbal cues  Exercises Exercises: General Upper Extremity, Shoulder General Exercises - Upper Extremity Shoulder Flexion: AAROM, 5 reps, Both, Seated Shoulder ABduction: AAROM, 5 reps, Both, Seated Shoulder Horizontal ABduction: 5 reps, Both, AAROM, Seated (x10 protractions as well but A/ROM since pt was able to achive near full range without assist.) Shoulder Exercises Shoulder External Rotation: AROM, 5 reps, Both, Seated           General Comments Pt on 4L throughout the session.    Pertinent Vitals/ Pain       Pain Assessment Pain Assessment: Faces Faces Pain Scale: No hurt                                                          Frequency  Min 2X/week        Progress Toward Goals  OT Goals(current goals can now be found in the care plan section)  Progress towards OT goals: Progressing toward goals  Acute Rehab OT Goals Patient Stated Goal: Improve function. OT Goal Formulation: With patient Time For Goal Achievement: 10/05/24 Potential to Achieve Goals: Good ADL Goals Pt Will Perform Grooming: with modified independence Pt Will Perform Lower Body Bathing: with modified independence Pt Will Perform Lower Body Dressing: with modified independence Pt Will Transfer to Toilet: with modified independence Pt Will Perform Toileting - Clothing Manipulation and hygiene: with modified independence Pt/caregiver will Perform Home Exercise Program:  Increased strength;Both right and left upper extremity;Independently  Plan                                      End of Session Equipment Utilized During Treatment: Gait belt;Rolling walker (2 wheels);Oxygen  OT Visit Diagnosis: Unsteadiness on feet (R26.81);Other abnormalities of gait and mobility (R26.89);Muscle weakness (generalized) (M62.81);History of falling (Z91.81)   Activity Tolerance Patient tolerated treatment well   Patient Left in chair;with call bell/phone within reach   Nurse Communication          Time:  8689-8670 OT Time Calculation (min): 19 min  Charges: OT General Charges $OT Visit: 1 Visit OT Treatments $Therapeutic Exercise: 8-22 mins  Brodyn Depuy OT, MOT  Jayson Person 09/23/2024, 2:31 PM

## 2024-09-23 NOTE — Progress Notes (Signed)
 Rounding Note   Patient Name: Lucas Dudley Date of Encounter: 09/23/2024  [facility] Cardiologist: [name], MD   Subjective Some ongoing SOB  Scheduled Meds:  acetaminophen   650 mg Oral Q6H   Or   acetaminophen   650 mg Rectal Q6H   aspirin   81 mg Oral Daily   brimonidine   1 drop Both Eyes BID   And   timolol   1 drop Both Eyes BID   Chlorhexidine  Gluconate Cloth  6 each Topical Daily   dapagliflozin  propanediol  10 mg Oral Daily   diltiazem   30 mg Oral Q6H   dorzolamide   1 drop Both Eyes BID   doxycycline   100 mg Oral Q12H   enoxaparin  (LOVENOX ) injection  40 mg Subcutaneous Q24H   gabapentin   300 mg Oral TID   insulin  aspart  0-9 Units Subcutaneous TID WC   insulin  aspart  8 Units Subcutaneous TID WC   insulin  glargine-yfgn  30 Units Subcutaneous QHS   pantoprazole   40 mg Oral QPM   Continuous Infusions:  cefTRIAXone  (ROCEPHIN )  IV Stopped (09/22/24 1340)   PRN Meds: bisacodyl , fentaNYL  (SUBLIMAZE ) injection, ipratropium-albuterol , LORazepam , ondansetron  **OR** ondansetron  (ZOFRAN ) IV, mouth rinse, oxyCODONE , traZODone    Vital Signs  Vitals:   09/22/24 2113 09/22/24 2215 09/23/24 0305 09/23/24 0500  BP: 125/66  113/63   Pulse: 93  71   Resp: 18  18   Temp: 97.8 F (36.6 C)  98 F (36.7 C)   TempSrc:      SpO2: 94% 92% 96%   Weight:    102.7 kg  Height:        Intake/Output Summary (Last 24 hours) at 09/23/2024 0849 Last data filed at 09/23/2024 0500 Gross per 24 hour  Intake 794.96 ml  Output 1300 ml  Net -505.04 ml      09/23/2024    5:00 AM 09/22/2024    4:19 AM 09/22/2024    2:57 AM  Last 3 Weights  Weight (lbs) 226 lb 6.6 oz 228 lb 6.4 oz 240 lb 1.3 oz  Weight (kg) 102.7 kg 103.602 kg 108.9 kg      Telemetry Rate controlled afib - Personally Reviewed  ECG  N/a - Personally Reviewed  Physical Exam  GEN: No acute distress.   Neck: No JVD Cardiac: irreg Respiratory: Clear to auscultation bilaterally. GI: Soft,  nontender, non-distended  MS: No edema; No deformity. Neuro:  Nonfocal  Psych: Normal affect   Labs High Sensitivity Troponin:  No results for input(s): TROPONINIHS in the last 720 hours.   Chemistry Recent Labs  Lab 09/19/24 1128 09/20/24 0503 09/21/24 0335 09/22/24 0448 09/23/24 0518  NA 137 138 134* 140 141  K 3.6 2.7* 3.5 3.1* 3.8  CL 91* 95* 95* 98 100  CO2 23 28 33* 35* 37*  GLUCOSE 239* 137* 118* 109* 153*  BUN 34* 38* 39* 29* 24*  CREATININE 1.44* 1.25* 1.19 1.18 1.25*  CALCIUM  8.9 8.3* 8.3* 8.3* 8.6*  MG  --  2.4  --  2.2  --   PROT 7.1 5.9* 6.0*  --   --   ALBUMIN 3.6 3.0* 3.0*  --   --   AST 49* 28 27  --   --   ALT 14 13 13   --   --   ALKPHOS 107 83 98  --   --   BILITOT 2.2* 1.0 0.8  --   --   GFRNONAA 49* 58* >60 >60 58*  ANIONGAP 23* 15 6  7 5    Lipids No results for input(s): CHOL, TRIG, HDL, LABVLDL, LDLCALC, CHOLHDL in the last 168 hours.  Hematology Recent Labs  Lab 09/21/24 0335 09/22/24 0448 09/23/24 0518  WBC 13.2* 10.2 11.2*  RBC 4.36 4.81 4.69  HGB 10.5* 11.4* 11.1*  HCT 35.3* 39.8 39.5  MCV 81.0 82.7 84.2  MCH 24.1* 23.7* 23.7*  MCHC 29.7* 28.6* 28.1*  RDW 17.9* 18.1* 18.5*  PLT 234 308 363   Thyroid  No results for input(s): TSH, FREET4 in the last 168 hours.  BNP Recent Labs  Lab 09/19/24 1128  PROBNP 4,082.0*    DDimer No results for input(s): DDIMER in the last 168 hours.   Radiology  No results found.   Patient Profile   Lucas Dudley is a 83 y.o. 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   Assessment & Plan   1. PAF - has watchman device - RVR on admission. Was found down at home after fall not able to get up, did not get his meds 1-2 days at home.  - RVR in setting of fall, pneumonia   - initially on dilt drip, now off.  Currently on oral dilt 30mg  every 6 hours.  - rates well controlled on current regimen. Of note at home was self rate controlled.  - continue short acting dilt at this time and follow rates.  2.Pneumonia - abx per primary team   3.Chronic HFpEF - 12/2023 echo: LVEF 55-60%, indet diastolic, mild RV dysfunction, severe pulm HTN PASP 63, dilated fixed IVC,  01/2024 limited echo: LVEF 55-60%, no WMAs, indet diastolic function, mild RV dysfunction, mod pulm HTN PASP 47.   Elevated proBNP, CXR without clear edema. Has chronic LE edema. Body habitus somewhat difficult to assess volume status otherwise - reds vest was 40% yesterday - torsemide  in 20mg  AM, IV lasix  60mg  x 1 in pm yesterday. Per I/Os negative . Weight 228-->226 lbs over 24 hrs, prior bed weights appear inaccurate. Weight at 10/29 clinic appt 224, had been 217 in 06/2024 - remains fluid overloaded, dose IV lasix  60mg  bid today.     - remains volume overloaded, contributing to ongoing SOB and O2 requirement. Got oral torsemide  20mg  this AM already, will stop oral regimen and will dose IV lasix  60mg  as evening dose and reassess tomorrow. Mild variation in Cr without clear trend.       4.CAD -01/2024 cath: mod nonobstructive CAD, mean PA 32, PCWP 16, PVR 2.7, CI 2.59 - no acute issues     5. Pulmonary HTN - 12/2023 echo: LVEF 55-60%, indet diastolic, mild RV dysfunction, severe pulm HTN PASP 63, dilated fixed IVC,  - has history of OSA, HFpEF. Unclear if any additional etiologies. 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    6. Pneumonia - per primary team    For questions or updates, please contact [facility] Please consult [email or web address] for contact info under       Signed, Alvan Carrier, MD  09/23/2024, 8:49 AM

## 2024-09-23 NOTE — Patient Instructions (Signed)

## 2024-09-23 NOTE — Progress Notes (Signed)
°   09/23/24 2250  BiPAP/CPAP/SIPAP  BiPAP/CPAP/SIPAP Pt Type Adult  BiPAP/CPAP/SIPAP DREAMSTATIOND  Mask Type Nasal pillows  Dentures removed? Not applicable  Mask Size Large  EPAP 8 cmH2O  Flow Rate 4 lpm  Patient Home Machine No  Patient Home Mask Yes  Patient Home Tubing No  Auto Titrate No  Device Plugged into RED Power Outlet Yes   Patient placed on CPAP for the night.

## 2024-09-23 NOTE — Progress Notes (Signed)
 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  He apparently had a fall about 4 days ago at home.  He did not seek medical treatment at the time.  He fell again 2 days ago and was not able to get up and had been on the floor for about 1-2 days.  He has been having difficulty for past few months with lower extremity weakness and his legs giving out.  They have not been able to determine the cause of this.  His daughter called EMS to check on him because she had not heard from him in a couple of days.  EMS found him to have right knee swelling.  He complained of shortness of breath and knee pain.  He has not had his medications in a couple of days.  The ED found him to be tachycardic in A-fib with RVR and was hypoxic and placed on 3 L nasal cannula.  His chest x-ray was suspicious for atelectasis and/or pneumonia.  He was started on IV diltiazem  infusion, IV antibiotics, Lasix , IV fluids.  He was noted to be tachypneic and had normal venous pH.  His blood sugar was 239.  His CK was 518.  Creatinine 1.44, GFR 48.5 mL/min and troponin 91.  White blood cell count 17.4, hemoglobin 11.9, platelets 271.  Bilirubin 2.2.  His trauma workup revealed normal pelvis imaging, right knee imaging reveals small suprapatellar joint effusion.  Admission requested for further management.   Assessment/Plan:  Paroxysmal Atrial fibrillation with RVR -- he was started on IV diltiazem  infusion in ED -- admitted to stepdown ICU --- transfer to telemetry bed  -- weaned off  diltiazem  infusion to oral Cardizem  30 mg every 6 hours  -- he is s/p watchman device as he could not tolerate full anticoagulation -- he previously had controlled rate Afib without AV nodal agent per cardiology notes PTA - continue short acting diltiazem    Acute on Chronic HFpEF -- ReDS vest 40% on 12/9 -- lasix  60 mg IV bid restarted -- cardiology team started him on IV furosemide  injection    Lobar  pneumonia Sepsis ruled out  -- 12/7 CXR with LLL opacity -- added incentive spirometry -- IV antibiotics--finished 5 days doxy/ceftriaxone    Generalized weakness Adult failure to thrive  Lumbar radiculopathy with leg weakness -- discussed with daughter symptoms of leg weakness progressing and requested MRI L spine -- if patient can tolerate will try to obtain MRI L spine without contrast  -- MRI with findings of 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  Will obtain PT evaluation.  MRI brain ordered to rule out acute CVA -- MRI brain negative for CVA -- Pt recommending SNF and patient is agreeable.   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 -- resume basal and prandial insulin  -- SSI coverage and frequent CBG monitoring ordered -- 09/19/24 A1c 11.1%   Acute urinary retention -- I/O caths done in ED -- temporary foley cath placement ordered -- tamsulosin 0.4 mg daily  -- removed foley 12/8 and he has been voiding    Essential hypertension  -- weaned off IV  diltiazem  infusion to oral diltiazem  30 mg every 6 hours     Hyperlipidemia -- he is reportedly intolerent to statins  --  cardiology had started him on zetia  daily previously but he now says he doesn't take it    Stage 3a CKD -- baseline creatinine 1.1-1.4 - monitor with diuresis   OSA on CPAP -- nightly CPAP ordered   Iron  deficiency anemia -- he is followed by hematology for this  -- he is no longer on octreotide  injections -- he is receiving IV iron  infusions.  He received IV iron  12/8 in hospital    CAD -- recent cath demonstrated 40% proximal to mid RCA stenosis and 60% PDA stenosis managed with medical therapy by cardiology.  -- he is on aspirin  81 mg daily  - no chest pain  Obesity -BMI 32.49 -lifestyle modification         Family Communication:   daughter at bedside 12/10  Consultants:  cardiology  Code Status:  FULL   DVT Prophylaxis:   [place] Lovenox    Procedures: As Listed in Progress Note Above  Antibiotics: Ceftriaxone  12/6>>12/10 Doxy 12/6>12/10    Subjective: Patient states that he is breathing better.  Denies any fevers, chills, chest pain, nausea, vomiting, diarrhea, Donnell pain.  Objective: Vitals:   09/22/24 2113 09/22/24 2215 09/23/24 0305 09/23/24 0500  BP: 125/66  113/63   Pulse: 93  71   Resp: 18  18   Temp: 97.8 F (36.6 C)  98 F (36.7 C)   TempSrc:      SpO2: 94% 92% 96%   Weight:    102.7 kg  Height:        Intake/Output Summary (Last 24 hours) at 09/23/2024 1757 Last data filed at 09/23/2024 0500 Gross per 24 hour  Intake 554.96 ml  Output 1000 ml  Net -445.04 ml   Weight change: -6.6 kg Exam:  General:  Pt is alert, follows commands appropriately, not in acute distress HEENT: No icterus, No thrush, No neck mass, [place]/AT Cardiovascular: IRRR, S1/S2, no rubs, no gallops Respiratory: Bibasilar rales but no wheezing.  Good air movement Abdomen: Soft/+BS, non tender, non distended, no guarding Extremities:2+LE edema, No lymphangitis, No petechiae, No rashes, no synovitis   Data Reviewed: I have personally reviewed following labs and imaging studies Basic Metabolic Panel: Recent Labs  Lab 09/19/24 1128 09/20/24 0503 09/21/24 0335 09/22/24 0448 09/23/24 0518  NA 137 138 134* 140 141  K 3.6 2.7* 3.5 3.1* 3.8  CL 91* 95* 95* 98 100  CO2 23 28 33* 35* 37*  GLUCOSE 239* 137* 118* 109* 153*  BUN 34* 38* 39* 29* 24*  CREATININE 1.44* 1.25* 1.19 1.18 1.25*  CALCIUM  8.9 8.3* 8.3* 8.3* 8.6*  MG  --  2.4  --  2.2  --     Liver Function Tests: Recent Labs  Lab 09/19/24 1128 09/20/24 0503 09/21/24 0335  AST 49* 28 27  ALT 14 13 13   ALKPHOS 107 83 98  BILITOT 2.2* 1.0 0.8  PROT 7.1 5.9* 6.0*  ALBUMIN 3.6 3.0* 3.0*   No results for input(s): LIPASE, AMYLASE in the last 168 hours. No results for input(s): AMMONIA in the last 168 hours. Coagulation Profile: No results for input(s): INR, PROTIME in the last 168 hours. CBC: Recent Labs  Lab 09/19/24 1128 09/20/24 0503 09/21/24 0335 09/22/24 0448 09/23/24 0518  WBC 17.4* 12.4* 13.2* 10.2 11.2*  NEUTROABS 13.7* 10.3* 11.0* 7.9* 8.6*  HGB 11.9* 10.4* 10.5* 11.4* 11.1*  HCT  41.0 35.1* 35.3* 39.8 39.5  MCV 82.5 81.3 81.0 82.7 84.2  PLT 271 229 234 308 363   Cardiac Enzymes: Recent Labs  Lab 09/19/24 1128 09/20/24 0503  CKTOTAL 518* 281   BNP: Invalid input(s): POCBNP CBG: Recent Labs  Lab 09/22/24 2115 09/23/24 0304 09/23/24 0805 09/23/24 1132 09/23/24 1654  GLUCAP 164* 230* 160* 144* 88   HbA1C: No results for input(s): HGBA1C in the last 72 hours. Urine analysis:    Component Value Date/Time   COLORURINE YELLOW 09/19/2024 1124   APPEARANCEUR CLEAR 09/19/2024 1124   APPEARANCEUR Clear 04/09/2024 1623   LABSPEC 1.021 09/19/2024 1124   PHURINE 5.0 09/19/2024 1124   GLUCOSEU >=500 (A) 09/19/2024 1124   HGBUR MODERATE (A) 09/19/2024 1124   BILIRUBINUR NEGATIVE 09/19/2024 1124   BILIRUBINUR Negative 04/09/2024 1623   KETONESUR 5 (A) 09/19/2024 1124   PROTEINUR 30 (A) 09/19/2024 1124   NITRITE NEGATIVE 09/19/2024 1124   LEUKOCYTESUR NEGATIVE 09/19/2024 1124   Sepsis Labs: @LABRCNTIP (procalcitonin:4,lacticidven:4) ) Recent Results (from the past 240 hours)  Blood culture (routine x 2)     Status: None (Preliminary result)   Collection Time: 09/19/24 12:51 PM   Specimen: BLOOD RIGHT ARM  Result Value Ref Range Status   Specimen Description BLOOD RIGHT ARM  Final   Special Requests Blood Culture adequate  volume  Final   Culture   Final    NO GROWTH 4 DAYS Performed at [facility], [address] [place], [place] [address]    Report Status PENDING  Incomplete  Blood culture (routine x 2)     Status: None (Preliminary result)   Collection Time: 09/19/24 12:56 PM   Specimen: Left Antecubital; Blood  Result Value Ref Range Status   Specimen Description LEFT ANTECUBITAL  Final   Special Requests Blood Culture adequate volume  Final   Culture   Final    NO GROWTH 4 DAYS Performed at [facility], [address], [place], [place] [address]    Report Status PENDING  Incomplete  MRSA Next Gen by PCR, Nasal     Status: None   Collection Time: 09/19/24  2:05 PM   Specimen: Nasal Mucosa; Nasal Swab  Result Value Ref Range Status   MRSA by PCR Next Gen NOT DETECTED NOT DETECTED Final    Comment: (NOTE) The GeneXpert MRSA Assay (FDA approved for NASAL specimens only), is one component of a comprehensive MRSA colonization surveillance program. It is not intended to diagnose MRSA infection nor to guide or monitor treatment for MRSA infections. Test performance is not FDA approved in patients less than 101 years old. Performed at [facility], [address], [place], [place] [address]      Scheduled Meds:  acetaminophen   650 mg Oral Q6H   Or   acetaminophen   650 mg Rectal Q6H   aspirin   81 mg Oral Daily   brimonidine   1 drop Both Eyes BID   And   timolol   1 drop Both Eyes BID   Chlorhexidine  Gluconate Cloth  6 each Topical Daily   dapagliflozin  propanediol  10 mg Oral Daily   diltiazem   30 mg Oral Q6H   dorzolamide   1 drop Both Eyes BID   doxycycline   100 mg Oral Q12H   enoxaparin  (LOVENOX ) injection  40 mg Subcutaneous Q24H   furosemide   60 mg Intravenous BID   [START ON 09/24/2024] gabapentin   300 mg Oral QHS   insulin  aspart  0-9 Units Subcutaneous TID WC   insulin  aspart  8 Units Subcutaneous TID WC   insulin  glargine  30 Units Subcutaneous QHS   liver oil-zinc  oxide   Topical  BID   pantoprazole   40 mg Oral QPM   Continuous Infusions:  Procedures/Studies: MR BRAIN WO CONTRAST Result Date: 09/20/2024 EXAM: MRI BRAIN WITHOUT CONTRAST 09/20/2024 02:20:10 PM TECHNIQUE: Multiplanar multisequence MRI of the head/brain was performed without the administration of intravenous contrast. COMPARISON: CT head 10/18/2018 CLINICAL HISTORY: Neuro deficit, acute, stroke suspected. Generalized weakness, rule out acute Cerebrovascular Accident. FINDINGS: BRAIN AND VENTRICLES: No acute infarct. No intracranial hemorrhage. No mass. No midline shift. No hydrocephalus. Mild for age chronic microvascular ischemic change. A few small remote left frontal cortical infarcts. Small remote right cerebellar infarct. Normal flow voids. ORBITS: No acute abnormality. SINUSES AND MASTOIDS: No acute abnormality. BONES AND SOFT TISSUES: Normal marrow signal. No acute soft tissue abnormality. IMPRESSION: 1. No acute intracranial abnormality. Electronically signed by: Gilmore Molt MD 09/20/2024 03:15 PM EST RP Workstation: [id number]   DG CHEST PORT 1 VIEW Result Date: 09/20/2024 EXAM: 1 VIEW(S) XRAY OF THE CHEST 09/20/2024 05:29:00 AM COMPARISON: 09/19/2024 CLINICAL HISTORY: 08790 Atelectasis 91209; 10031 Cough 10031 FINDINGS: LUNGS AND PLEURA: Low lung volumes. Possible left retrocardiac opacity, favored to reflect atelectasis, but difficult to exclude superimposed airspace opacity. Decreased left pleural effusion. Trace right pleural effusion. No pneumothorax. HEART AND MEDIASTINUM: Cardiomegaly. Atherosclerotic calcifications. BONES AND SOFT TISSUES: No acute osseous abnormality. IMPRESSION: 1. Decreased left pleural effusion and trace right pleural effusion. 2. Possible left retrocardiac opacity, favored to reflect atelectasis, but difficult to exclude superimposed airspace opacity. 3. Cardiomegaly and atherosclerotic calcifications. Electronically signed by: Evalene Coho MD 09/20/2024 06:05 AM EST RP  Workstation: [id number]   MR LUMBAR SPINE WO CONTRAST Result Date: 09/19/2024 EXAM: MRI LUMBAR SPINE 09/19/2024 04:34:40 PM TECHNIQUE: Multiplanar multisequence MRI of the lumbar spine was performed without the administration of intravenous contrast. COMPARISON: 04/08/2020 CLINICAL HISTORY: Lumbar radiculopathy, symptoms persist with > 6 wks treatment; Myelopathy, acute, lumbar spine. FINDINGS: BONES AND ALIGNMENT: Normal alignment except for slight retrolisthesis at L2-L3 and L3-L4, which is stable. Normal vertebral body heights. Bone marrow signal is unremarkable except for extensive type 2 Modic changes at L2-L3 and L3-L4, and edematous changes that have progressed at L4-L5 and L5-S1. SPINAL CORD: The conus medullaris terminates at L1. SOFT TISSUES: No paraspinal mass. L1-L2: No significant disc herniation. No spinal canal stenosis or neural foraminal narrowing. L2-L3: Uncovertebral broad-based disc protrusion results in stable mild foraminal narrowing bilaterally. A broad-based disc protrusion and prominent epidural fat results in moderate central stenosis. Severe left and moderate right foraminal narrowing demonstrate some progression since the prior study. L3-L4: Broad-based disc protrusion has progressed. Moderate facet hypertrophy has progressed bilaterally. 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 Progressive moderate foraminal stenosis is present bilaterally, right greater than left. L4-L5: Chronic positive disc height is present. Endplate ridging and epidural lipomatosis result in moderate central canal stenosis. Moderate-to-severe right and moderate left foraminal stenosis is stable. L5-S1: Edematous changes have progressed. No significant disc herniation. No spinal canal stenosis or neural foraminal narrowing. IMPRESSION: 1. 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 2.  Moderate central canal stenosis at L2-3 from broad-based disc protrusion and prominent epidural fat. 3. Progressive moderate foraminal stenosis bilaterally at L4-5, right greater than left. 4. Severe left and moderate right foraminal narrowing at L2-3 with some progression since prior study. 5. Progression of edematous changes at L4-5 and  L5-S1. 6. Progression of broad-based disc protrusion and moderate facet hypertrophy bilaterally at L4-5. Electronically signed by: Lonni Necessary MD 09/19/2024 06:28 PM EST RP Workstation: [id number]   DG Chest Port 1 View Result Date: 09/19/2024 EXAM: 1 VIEW(S) XRAY OF THE CHEST 09/19/2024 11:45:00 AM COMPARISON: 01/20/2024 CLINICAL HISTORY: shortness of breath FINDINGS: LUNGS AND PLEURA: Probable left retrocardiac opacity concerning for atelectasis or infiltrate. Possible effusion. No pneumothorax. HEART AND MEDIASTINUM: Stable cardiomegaly. No acute abnormality of the mediastinal silhouette. BONES AND SOFT TISSUES: No acute osseous abnormality. IMPRESSION: 1. Left retrocardiac opacity suspicious for atelectasis or infiltrate, with possible effusion. 2. Stable cardiomegaly. Electronically signed by: Lynwood Seip MD 09/19/2024 12:16 PM EST RP Workstation: [id number]   DG Knee Complete 4 Views Right Result Date: 09/19/2024 EXAM: 4 OR MORE VIEW(S) XRAY OF THE RIGHT KNEE 09/19/2024 11:45:00 AM COMPARISON: None available. CLINICAL HISTORY: shortness of breath FINDINGS: BONES AND JOINTS: No acute fracture. No malalignment. Small suprapatellar joint effusion is noted. Minimal narrowing of lateral joint space is noted. SOFT TISSUES: The soft tissues are unremarkable. IMPRESSION: 1. Small suprapatellar joint effusion. 2. Minimal lateral compartment joint space narrowing. Electronically signed by: Lynwood Seip MD 09/19/2024 12:14 PM EST RP Workstation: [id number]   DG Pelvis 1-2 Views Result Date: 09/19/2024 EXAM: 1 or 2 view(s) Xray of the pelvis 09/19/2024 11:45:00 AM  COMPARISON: None available. CLINICAL HISTORY: Shortness of breath FINDINGS: BONES AND JOINTS: No acute fracture. No malalignment. SOFT TISSUES: The soft tissues are unremarkable. IMPRESSION: 1. No significant abnormality. Electronically signed by: Lynwood Seip MD 09/19/2024 12:13 PM EST RP Workstation: [id number]   Intravitreal Injection, Pharmacologic Agent - OD - Right Eye Result Date: 09/04/2024 Time Out 09/04/2024. 2:37 PM. Confirmed correct patient, procedure, site, and patient consented. Anesthesia Topical anesthesia was used. Anesthetic medications included Lidocaine  2%, Proparacaine 0.5%. Procedure Preparation included 5% betadine to ocular surface, eyelid speculum. A supplied needle was used. Injection: 6 mg faricimab -svoa 6 MG/0.05ML (Patient supplied)   Route: Intravitreal, Site: Right Eye   NDC: 49757-903-93, Lot: [id number], Expiration date: 07/14/2025, Waste: 0 mL Post-op Post injection exam found visual acuity of at least counting fingers. The patient tolerated the procedure well. There were no complications. The patient received written and verbal post procedure care education. Post injection medications were not given. Notes **PAP MEDICATION ADMINISTERED**   Intravitreal Injection, Pharmacologic Agent - OS - Left Eye Result Date: 09/04/2024 Time Out 09/04/2024. 2:37 PM. Confirmed correct patient, procedure, site, and patient consented. Anesthesia Topical anesthesia was used. Anesthetic medications included Lidocaine  2%, Proparacaine 0.5%. Procedure Preparation included 5% betadine to ocular surface, eyelid speculum. A supplied (32g) needle was used. Injection: 6 mg faricimab -svoa 6 MG/0.05ML (Patient supplied)   Route: Intravitreal, Site: Left Eye   NDC: 49757-903-93, Lot: [id number], Expiration date: 07/14/2025, Waste: 0 mL Post-op Post injection exam found visual acuity of at least counting fingers. The patient tolerated the procedure well. There were no complications. The patient received  written and verbal post procedure care education. Post injection medications were not given. Notes **PAP MEDICATION ADMINISTERED**  OCT, Retina - OU - Both Eyes Result Date: 09/04/2024 Right Eye Quality was good. Central Foveal Thickness: 269. Progression has improved. Findings include normal foveal contour, no SRF, intraretinal fluid, vitreomacular adhesion (Interval resolution of trace cystic changes ST fovea, partial PVD). Left Eye Quality was good. Central Foveal Thickness: 269. Progression has been stable. Findings include normal foveal contour, no IRF, no SRF, intraretinal hyper-reflective material (persistent cystic changes and punctate IRHM, partial PVD). Notes *Images captured  and stored on drive Diagnosis / Impression: +DME OU OD: Interval resolution of trace cystic changes ST fovea,  partial PVD OS: persistent cystic changes and punctate IRHM, partial PVD Clinical management: See below Abbreviations: NFP - Normal foveal profile. CME - cystoid macular edema. PED - pigment epithelial detachment. IRF - intraretinal fluid. SRF - subretinal fluid. EZ - ellipsoid zone. ERM - epiretinal membrane. ORA - outer retinal atrophy. ORT - outer retinal tubulation. SRHM - subretinal hyper-reflective material. IRHM - intraretinal hyper-reflective material   Alm Schneider, DO  [facility]  If 7PM-7AM, please contact night-coverage [email or web address] Password [email or web address] 09/23/2024, 5:57 PM   LOS: 4 days

## 2024-09-23 NOTE — Progress Notes (Signed)
 Physical Therapy Treatment Patient Details Name: Lucas Dudley MRN: [id number] DOB: [birth year] Today's Date: 09/23/2024   History of Present Illness Lucas Dudley is a 83 year old male coronary artery disease, HFpEF, pulmonary hypertension, persistent A-fib, status post watchman implantation, hypertension, hyperlipidemia, OSA on CPAP, stage IIIa CKD, normocytic anemia, uncontrolled type 2 diabetes mellitus on insulin , statin myopathy, gout, diabetic neuropathy, chronic gastric erosion, obesity, DOE, constipation who currently lives alone.  He apparently had a fall about 4 days ago at home.  He did not seek medical treatment at the time.  He fell again 2 days ago and was not able to get up and had been on the floor for about 1-2 days.  He has been having difficulty for past few months with lower extremity weakness and his legs giving out.  They have not been able to determine the cause of this.  His daughter called EMS to check on him because she had not heard from him in a couple of days.  EMS found him to have right knee swelling.  He complained of shortness of breath and knee pain.  He has not had his medications in a couple of days.  The ED found him to be tachycardic in A-fib with RVR and was hypoxic and placed on 3 L nasal cannula.  His chest x-ray was suspicious for atelectasis and/or pneumonia.  He was started on IV diltiazem  infusion, IV antibiotics, Lasix , IV fluids.  He was noted to be tachypneic and had normal venous pH.  His blood sugar was 239.  His CK was 518.  Creatinine 1.44, GFR 48.5 mL/min and troponin 91.  White blood cell count 17.4, hemoglobin 11.9, platelets 271.  Bilirubin 2.2.  His trauma workup revealed normal pelvis imaging, right knee imaging reveals small suprapatellar joint effusion.  Admission requested for further management.    PT Comments  Patient agreeable to PT treatment session. Patient was received sitting in chair at start of session. Pt able to scoot bottom, one hip  at a time, to EOB for core engagement during seated LE exercises. Visual cueing given during exercises. Pt able to tolerate seated LE exercise well, with no reports of increased pain. Followed with STS, requiring min/mod assist. Improvement noted with verbal cueing to use BUE to push up from chair to stand versus pulling on RW. Pt able to ambulate forward and backward a few feet in room with RW and min/CGA; 2-3 instances of mild knee buckling occurs. Pt tolerates sitting in chair at end of session, call button in reach and all needs met. Patient will benefit from continued skilled physical therapy acutely and in recommended venue in order to address current deficits and improve overall function.     If plan is discharge home, recommend the following: A lot of help with walking and/or transfers;Assistance with cooking/housework;Assist for transportation;Help with stairs or ramp for entrance;A lot of help with bathing/dressing/bathroom   Can travel by private vehicle     Yes  Equipment Recommendations  None recommended by PT    Recommendations for Other Services       Precautions / Restrictions Precautions Precautions: Fall Recall of Precautions/Restrictions: Intact Restrictions Weight Bearing Restrictions Per Provider Order: No     Mobility  Bed Mobility               General bed mobility comments: Pt received seated in the chair at start of session.    Transfers Overall transfer level: Needs assistance Equipment used: Rolling walker (2 wheels)  Transfers: Sit to/from Stand Sit to Stand: Mod assist, Min assist           General transfer comment: STS from chair with RW and mod/min assist and verbal cues for hand placement on arm rests to push rather than pull on RW    Ambulation/Gait Ambulation/Gait assistance: Min assist Gait Distance (Feet): 8 Feet Assistive device: Rolling walker (2 wheels) Gait Pattern/deviations: Decreased step length - right, Decreased step  length - left, Decreased stride length, Trunk flexed, Knees buckling Gait velocity: Dec     General Gait Details: pt limited to two rounds of a few forward and backward steps with RW in room and min A/CGA due to mild knee buckling at times, limited due ot inc fatigue, step lengths greatly limited, and poor foot clearance,   Stairs             Wheelchair Mobility     Tilt Bed    Modified Rankin (Stroke Patients Only)       Balance Overall balance assessment: Needs assistance Sitting-balance support: Bilateral upper extremity supported, Feet supported Sitting balance-Leahy Scale: Fair Sitting balance - Comments: Fair to good seated in the recliner.   Standing balance support: Bilateral upper extremity supported, During functional activity, Reliant on assistive device for balance Standing balance-Leahy Scale: Poor Standing balance comment: poor to fair with RW                            Communication Communication Communication: No apparent difficulties  Cognition Arousal: Alert Behavior During Therapy: WFL for tasks assessed/performed                             Following commands: Intact      Cueing Cueing Techniques: Verbal cues, Visual cues  Exercises General Exercises - Lower Extremity Long Arc Quad: AROM, Strengthening, Both, 10 reps, Seated Hip ABduction/ADduction: AROM, Strengthening, Both, 10 reps, Seated Hip Flexion/Marching: AROM, Strengthening, Both, 10 reps, Seated Toe Raises: AROM, Strengthening, Both, 10 reps, Seated Heel Raises: AROM, Strengthening, Both, 10 reps, Seated    General Comments General comments (skin integrity, edema, etc.): Pt on 4L throughout the session.      Pertinent Vitals/Pain Pain Assessment Pain Assessment: 0-10 Pain Score: 2  Pain Location: hurt all over Pain Descriptors / Indicators: Constant Pain Intervention(s): Limited activity within patient's tolerance, Repositioned, Monitored during  session    Home Living                          Prior Function            PT Goals (current goals can now be found in the care plan section) Acute Rehab PT Goals Patient Stated Goal: return home after rehab PT Goal Formulation: With patient Time For Goal Achievement: 10/05/24 Potential to Achieve Goals: Good Progress towards PT goals: Progressing toward goals    Frequency    Min 3X/week      PT Plan      Co-evaluation              AM-PAC PT 6 Clicks Mobility   Outcome Measure  Help needed turning from your back to your side while in a flat bed without using bedrails?: A Little Help needed moving from lying on your back to sitting on the side of a flat bed without using bedrails?: A Lot Help needed moving  to and from a bed to a chair (including a wheelchair)?: A Little Help needed standing up from a chair using your arms (e.g., wheelchair or bedside chair)?: A Lot Help needed to walk in hospital room?: A Little Help needed climbing 3-5 steps with a railing? : A Lot 6 Click Score: 15    End of Session Equipment Utilized During Treatment: Gait belt Activity Tolerance: Patient tolerated treatment well;Patient limited by fatigue Patient left: in chair;with call bell/phone within reach Nurse Communication: Mobility status PT Visit Diagnosis: Unsteadiness on feet (R26.81);Other abnormalities of gait and mobility (R26.89);Muscle weakness (generalized) (M62.81)     Time: 8399-8383 PT Time Calculation (min) (ACUTE ONLY): 16 min  Charges:    $Therapeutic Exercise: 8-22 mins PT General Charges $$ ACUTE PT VISIT: 1 Visit                     4:40 PM, 09/23/24 Landen Knoedler Powell-Butler, PT, DPT [place] with [facility]

## 2024-09-24 DIAGNOSIS — E876 Hypokalemia: Secondary | ICD-10-CM

## 2024-09-24 LAB — BASIC METABOLIC PANEL WITH GFR
Anion gap: 3 — ABNORMAL LOW (ref 5–15)
BUN: 21 mg/dL (ref 8–23)
CO2: 39 mmol/L — ABNORMAL HIGH (ref 22–32)
Calcium: 8.5 mg/dL — ABNORMAL LOW (ref 8.9–10.3)
Chloride: 98 mmol/L (ref 98–111)
Creatinine, Ser: 1.21 mg/dL (ref 0.61–1.24)
GFR, Estimated: >60 mL/min (ref >60)
Glucose, Bld: 145 mg/dL — ABNORMAL HIGH (ref 70–99)
Potassium: 3.4 mmol/L — ABNORMAL LOW (ref 3.5–5.1)
Sodium: 140 mmol/L (ref 135–145)

## 2024-09-24 LAB — CULTURE, BLOOD (ROUTINE X 2)
Culture: NO GROWTH
Special Requests: ADEQUATE

## 2024-09-24 LAB — GLUCOSE, CAPILLARY
Glucose-Capillary: 179 mg/dL — ABNORMAL HIGH (ref 70–99)
Glucose-Capillary: 137 mg/dL — ABNORMAL HIGH (ref 70–99)
Glucose-Capillary: 142 mg/dL — ABNORMAL HIGH (ref 70–99)
Glucose-Capillary: 70 mg/dL (ref 70–99)
Glucose-Capillary: 184 mg/dL — ABNORMAL HIGH (ref 70–99)

## 2024-09-24 LAB — MAGNESIUM: Magnesium: 2.4 mg/dL (ref 1.7–2.4)

## 2024-09-24 MED ORDER — FUROSEMIDE 10 MG/ML IJ SOLN
80.0000 mg | Freq: Two times a day (BID) | INTRAMUSCULAR | Status: AC
  Administered 2024-09-24: 80 mg via INTRAVENOUS
  Filled 2024-09-24: qty 8

## 2024-09-24 MED ORDER — INSULIN ASPART 100 UNIT/ML IJ SOLN
2.0000 [IU] | Freq: Three times a day (TID) | INTRAMUSCULAR | Status: DC
  Administered 2024-09-25 – 2024-09-29 (×14): 2 [IU] via SUBCUTANEOUS
  Filled 2024-09-24 (×14): qty 1

## 2024-09-24 MED ORDER — GABAPENTIN 100 MG PO CAPS
100.0000 mg | ORAL_CAPSULE | Freq: Every day | ORAL | Status: DC
  Administered 2024-09-24 – 2024-09-28 (×5): 100 mg via ORAL
  Filled 2024-09-24 (×5): qty 1

## 2024-09-24 MED ORDER — FUROSEMIDE 10 MG/ML IJ SOLN
20.0000 mg | Freq: Once | INTRAMUSCULAR | Status: AC
  Administered 2024-09-24: 20 mg via INTRAVENOUS
  Filled 2024-09-24: qty 2

## 2024-09-24 MED ORDER — POTASSIUM CHLORIDE CRYS ER 20 MEQ PO TBCR
40.0000 meq | EXTENDED_RELEASE_TABLET | Freq: Four times a day (QID) | ORAL | Status: AC
  Administered 2024-09-24 (×2): 40 meq via ORAL
  Filled 2024-09-24 (×2): qty 2

## 2024-09-24 NOTE — Progress Notes (Signed)
 Lab called with Anaerobic bottle Gram positive rods unable to reach nurse Brandi so I received the result. Evonnie Lenis MD has been paged

## 2024-09-24 NOTE — TOC Progression Note (Signed)
 Transition of Care [facility]) - Progression Note    Patient Details  Name: Lucas Dudley MRN: [id number] Date of Birth: [birth year]  Transition [facility] CM/SW Contact  Noreen KATHEE [place], [place] Phone Number: 09/24/2024, 3:42 PM  Clinical Narrative:     CSW called [employer] and spoke with [facility], she confirmed that she will cancel patient auth today since patient is not medically ready.   Expected Discharge Plan: Skilled Nursing Facility Barriers to Discharge: Continued Medical Work up               Expected Discharge Plan and Services     Post Acute Care Choice: Home Health Living arrangements for the past 2 months: Single Family Home                                       Social Drivers of Health (SDOH) Interventions SDOH Screenings   Food Insecurity: No Food Insecurity (09/19/2024)  Housing: Low Risk (09/19/2024)  Transportation Needs: No Transportation Needs (09/19/2024)  Utilities: Not At Risk (09/19/2024)  Alcohol Screen: Low Risk (01/27/2024)  Depression (PHQ2-9): Low Risk (09/16/2024)  Financial Resource Strain: Low Risk (02/10/2024)  Physical Activity: Inactive (02/10/2024)  Social Connections: Moderately Integrated (09/19/2024)  Stress: No Stress Concern Present (02/10/2024)  Tobacco Use: Medium Risk (09/20/2024)  Health Literacy: Adequate Health Literacy (11/20/2023)    Readmission Risk Interventions    09/24/2024    3:40 PM 01/28/2024    1:28 PM 01/14/2024   10:05 AM  Readmission Risk Prevention Plan  Transportation Screening Complete Complete Complete  HRI or Home Care Consult   Complete  Social Work Consult for Recovery Care Planning/Counseling   Complete  Palliative Care Screening   Not Applicable  Medication Review Oceanographer)  Referral to Pharmacy Complete  PCP or Specialist appointment within 3-5 days of discharge Complete Complete   HRI or Home Care Consult Complete Complete   SW Recovery Care/Counseling Consult Complete Complete   Palliative  Care Screening Not Applicable Not Applicable   Skilled Nursing Facility Complete Not Applicable

## 2024-09-24 NOTE — Progress Notes (Signed)
°   09/24/24 2257  BiPAP/CPAP/SIPAP  BiPAP/CPAP/SIPAP Pt Type Adult  BiPAP/CPAP/SIPAP DREAMSTATIOND  Mask Type Nasal pillows  Dentures removed? Not applicable  Mask Size Large  EPAP 8 cmH2O  Flow Rate 4 lpm  Patient Home Machine No  Patient Home Mask Yes  Patient Home Tubing No  Auto Titrate No  Device Plugged into RED Power Outlet Yes   Patient placed on CPAP for the night.

## 2024-09-24 NOTE — Progress Notes (Addendum)
 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  He apparently had a fall about 4 days ago at home.  He did not seek medical treatment at the time.  He fell again 2 days ago and was not able to get up and had been on the floor for about 1-2 days.  He has been having difficulty for past few months with lower extremity weakness and his legs giving out.  They have not been able to determine the cause of this.  His daughter called EMS to check on him because she had not heard from him in a couple of days.  EMS found him to have right knee swelling.  He complained of shortness of breath and knee pain.  He has not had his medications in a couple of days.  The ED found him to be tachycardic in A-fib with RVR and was hypoxic and placed on 3 L nasal cannula.  His chest x-ray was suspicious for atelectasis and/or pneumonia.  He was started on IV diltiazem  infusion, IV antibiotics, Lasix , IV fluids.  He was noted to be tachypneic and had normal venous pH.  His blood sugar was 239.  His CK was 518.  Creatinine 1.44, GFR 48.5 mL/min and troponin 91.  White blood cell count 17.4, hemoglobin 11.9, platelets 271.  Bilirubin 2.2.  His trauma workup revealed normal pelvis imaging, right knee imaging reveals small suprapatellar joint effusion.  Admission requested for further management.   Assessment/Plan: Paroxysmal Atrial fibrillation with RVR -- he was started on IV diltiazem  infusion in ED -- admitted to stepdown ICU --- transfer to telemetry bed  -- weaned off  diltiazem  infusion to oral Cardizem  30 mg every 6 hours  -- he is s/p watchman device as he could not tolerate full anticoagulation -- he previously had controlled rate Afib without AV nodal agent per cardiology notes PTA - continue short acting diltiazem     Acute on Chronic HFpEF -- ReDS vest 40% on 12/9 -- lasix  increased to 80 mg IV bid -- cardiology team started him on IV furosemide  injection  - down to 225 lbs - I/O incomplete   Lobar  pneumonia Sepsis ruled out  -- 12/7 CXR with LLL opacity -- added incentive spirometry -- IV antibiotics--finished 5 days doxy/ceftriaxone    Generalized weakness Adult failure to thrive  Lumbar radiculopathy with leg weakness -- discussed with daughter symptoms of leg weakness progressing and requested MRI L spine -- if patient can tolerate will try to obtain MRI L spine without contrast  -- MRI with findings of 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  Will obtain PT evaluation.  MRI brain ordered to rule out acute CVA -- MRI brain negative for CVA -- Pt recommending SNF and patient is agreeable.   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 -- continue semglee  30 units and novolog  2 units with meals -- SSI coverage and frequent CBG monitoring ordered -- 09/19/24 A1c 11.1%    Acute urinary retention -- I/O caths done in ED -- temporary foley cath placement ordered -- tamsulosin 0.4 mg daily  -- removed foley 12/8  and he has been voiding    Essential hypertension  -- weaned off IV diltiazem  infusion to oral diltiazem  30 mg every 6 hours     Hyperlipidemia -- he is reportedly intolerent to statins  --  cardiology had started him on zetia  daily previously but he now says he doesn't take it    Stage 3a CKD -- baseline creatinine 1.1-1.4 - monitor with diuresis   OSA on CPAP -- nightly CPAP ordered   Iron  deficiency anemia -- he is followed by hematology  -- he is no longer on octreotide  injections -- he is receiving IV iron   infusions.  He received IV iron  12/8 in hospital   CAD -- recent cath demonstrated 40% proximal to mid RCA stenosis and 60% PDA stenosis managed with medical therapy by cardiology.  -- he is on aspirin  81 mg daily  - no chest pain   Obesity -BMI 32.49 -lifestyle modification  Hypokalemia -replete -mag - 2.4                 Family Communication:   daughter at bedside 12/10   Consultants:  cardiology   Code Status:  FULL    DVT Prophylaxis:   [place] Lovenox      Procedures: As Listed in Progress Note Above   Antibiotics: Ceftriaxone  12/6>>12/10 Doxy 12/6>12/10      Subjective: He is breathing better.  Denies cp, n/v/d, abd pain, f/c  Objective: Vitals:   09/23/24 2204 09/24/24 0540 09/24/24 0630 09/24/24 1342  BP: (!) 147/70  124/68 107/75  Pulse: 88  93 65  Resp: 16  20   Temp: 98.7 F (37.1 C)  98.9 F (37.2 C)   TempSrc: Oral  Oral   SpO2: 93%  100% 98%  Weight:  102.1 kg    Height:        Intake/Output Summary (Last 24 hours) at 09/24/2024 1758 Last data filed at 09/24/2024 1613 Gross per 24 hour  Intake 718 ml  Output 3950 ml  Net -3232 ml   Weight change: -0.6 kg Exam:  General:  Pt is alert, follows commands appropriately, not in acute distress HEENT: No icterus, No thrush, No neck mass, [place]/AT Cardiovascular: RRR, S1/S2, no rubs, no gallops Respiratory: bibasilar crackles. No wheeze Abdomen: Soft/+BS, non tender, non distended, no guarding Extremities: 1 + LE edema, No lymphangitis, No petechiae, No rashes, no synovitis   Data Reviewed: I have personally reviewed following labs and imaging studies Basic Metabolic Panel: Recent Labs  Lab 09/20/24 0503 09/21/24 0335 09/22/24 0448 09/23/24 0518 09/24/24 0624  NA 138 134* 140 141 140  K 2.7* 3.5 3.1* 3.8 3.4*  CL 95* 95* 98 100 98  CO2 28 33* 35* 37* 39*  GLUCOSE 137* 118* 109* 153* 145*  BUN 38* 39* 29* 24* 21  CREATININE 1.25* 1.19 1.18 1.25* 1.21  CALCIUM  8.3* 8.3* 8.3*  8.6* 8.5*  MG 2.4  --  2.2  --  2.4   Liver Function Tests: Recent Labs  Lab 09/19/24 1128 09/20/24 0503 09/21/24 0335  AST 49* 28 27  ALT 14 13 13   ALKPHOS 107 83 98  BILITOT 2.2* 1.0 0.8  PROT 7.1 5.9* 6.0*  ALBUMIN 3.6 3.0* 3.0*   No results for input(s): LIPASE, AMYLASE in the last 168 hours. No results for input(s): AMMONIA in the last 168 hours. Coagulation Profile: No results for input(s): INR, PROTIME in the last 168 hours. CBC: Recent Labs  Lab 09/19/24 1128 09/20/24 0503 09/21/24 0335 09/22/24 0448  09/23/24 0518  WBC 17.4* 12.4* 13.2* 10.2 11.2*  NEUTROABS 13.7* 10.3* 11.0* 7.9* 8.6*  HGB 11.9* 10.4* 10.5* 11.4* 11.1*  HCT 41.0 35.1* 35.3* 39.8 39.5  MCV 82.5 81.3 81.0 82.7 84.2  PLT 271 229 234 308 363   Cardiac Enzymes: Recent Labs  Lab 09/19/24 1128 09/20/24 0503  CKTOTAL 518* 281   BNP: Invalid input(s): POCBNP CBG: Recent Labs  Lab 09/23/24 2118 09/24/24 0324 09/24/24 0734 09/24/24 1209 09/24/24 1611  GLUCAP 187* 179* 137* 142* 70   HbA1C: No results for input(s): HGBA1C in the last 72 hours. Urine analysis:    Component Value Date/Time   COLORURINE YELLOW 09/19/2024 1124   APPEARANCEUR CLEAR 09/19/2024 1124   APPEARANCEUR Clear 04/09/2024 1623   LABSPEC 1.021 09/19/2024 1124   PHURINE 5.0 09/19/2024 1124   GLUCOSEU >=500 (A) 09/19/2024 1124   HGBUR MODERATE (A) 09/19/2024 1124   BILIRUBINUR NEGATIVE 09/19/2024 1124   BILIRUBINUR Negative 04/09/2024 1623   KETONESUR 5 (A) 09/19/2024 1124   PROTEINUR 30 (A) 09/19/2024 1124   NITRITE NEGATIVE 09/19/2024 1124   LEUKOCYTESUR NEGATIVE 09/19/2024 1124   Sepsis Labs: @LABRCNTIP (procalcitonin:4,lacticidven:4) ) Recent Results (from the past 240 hours)  Blood culture (routine x 2)     Status: None   Collection Time: 09/19/24 12:51 PM   Specimen: BLOOD RIGHT ARM  Result Value Ref Range Status   Specimen Description BLOOD RIGHT ARM  Final   Special Requests Blood  Culture adequate volume  Final   Culture  Setup Time   Final    ANAEROBIC BOTTLE ONLY GRAM POSITIVE RODS Gram Stain Report Called to,Read Back By and Verified With: JAYSON ISLAND AT 1556 ON 12.11.25 BY ADGER J  Performed at [facility], [address], [place], [place] [address]    Culture   Final    GRAM POSITIVE RODS CORRECTED ON 12/11 AT 1556: PREVIOUSLY REPORTED AS NO GROWTH 5 DAYS   Report Status 09/24/2024 FINAL  Final  Blood culture (routine x 2)     Status: None   Collection Time: 09/19/24 12:56 PM   Specimen: Left Antecubital; Blood  Result Value Ref Range Status   Specimen Description LEFT ANTECUBITAL  Final   Special Requests Blood Culture adequate volume  Final   Culture   Final    NO GROWTH 5 DAYS Performed at [facility], [address] [place], [place] [address]    Report Status 09/24/2024 FINAL  Final  MRSA Next Gen by PCR, Nasal     Status: None   Collection Time: 09/19/24  2:05 PM   Specimen: Nasal Mucosa; Nasal Swab  Result Value Ref Range Status   MRSA by PCR Next Gen NOT DETECTED NOT DETECTED Final    Comment: (NOTE) The GeneXpert MRSA Assay (FDA approved for NASAL specimens only), is one component of a comprehensive MRSA colonization surveillance program. It is not intended to diagnose MRSA infection nor to guide or monitor treatment for MRSA infections. Test performance is not FDA approved in patients less than 39 years old. Performed at [facility], [address] [place], [place] [address]      Scheduled Meds:  acetaminophen   650 mg Oral Q6H   Or   acetaminophen   650 mg Rectal Q6H   aspirin   81 mg Oral Daily   brimonidine   1 drop Both Eyes BID   And   timolol   1 drop Both Eyes BID   Chlorhexidine  Gluconate Cloth  6 each Topical Daily   dapagliflozin  propanediol  10 mg Oral Daily   diltiazem   30 mg Oral Q6H   dorzolamide   1 drop Both Eyes BID   enoxaparin  (LOVENOX ) injection  40 mg Subcutaneous Q24H   furosemide   80 mg Intravenous BID    gabapentin   100 mg Oral QHS   insulin  aspart  0-9 Units Subcutaneous TID WC   insulin  aspart  4 Units Subcutaneous TID WC   insulin  glargine  30 Units Subcutaneous QHS   liver oil-zinc  oxide   Topical BID   pantoprazole   40 mg Oral QPM   potassium chloride   40 mEq Oral Q6H   Continuous Infusions:  Procedures/Studies: MR BRAIN WO CONTRAST Result Date: 09/20/2024 EXAM: MRI BRAIN WITHOUT CONTRAST 09/20/2024 02:20:10 PM TECHNIQUE: Multiplanar multisequence MRI of the head/brain was performed without the administration of intravenous contrast. COMPARISON: CT head 10/18/2018 CLINICAL HISTORY: Neuro deficit, acute, stroke suspected. Generalized weakness, rule out acute Cerebrovascular Accident. FINDINGS: BRAIN AND VENTRICLES: No acute infarct. No intracranial hemorrhage. No mass. No midline shift. No hydrocephalus. Mild for age chronic microvascular ischemic change. A few small remote left frontal cortical infarcts. Small remote right cerebellar infarct. Normal flow voids. ORBITS: No acute abnormality. SINUSES AND MASTOIDS: No acute abnormality. BONES AND SOFT TISSUES: Normal marrow signal. No acute soft tissue abnormality. IMPRESSION: 1. No acute intracranial abnormality. Electronically signed by: Gilmore Molt MD 09/20/2024 03:15 PM EST RP Workstation: [id number]   DG CHEST PORT 1 VIEW Result Date: 09/20/2024 EXAM: 1 VIEW(S) XRAY OF THE CHEST 09/20/2024 05:29:00 AM COMPARISON: 09/19/2024 CLINICAL HISTORY: 08790 Atelectasis 91209; 10031 Cough 10031 FINDINGS: LUNGS AND PLEURA: Low lung volumes. Possible left retrocardiac opacity, favored to reflect atelectasis, but difficult to exclude superimposed airspace opacity. Decreased left pleural effusion. Trace right pleural effusion. No pneumothorax. HEART AND MEDIASTINUM: Cardiomegaly. Atherosclerotic calcifications. BONES AND SOFT TISSUES: No acute osseous abnormality. IMPRESSION: 1. Decreased left pleural effusion and trace right pleural effusion. 2. Possible  left retrocardiac opacity, favored to reflect atelectasis, but difficult to exclude superimposed airspace opacity. 3. Cardiomegaly and atherosclerotic calcifications. Electronically signed by: Evalene Coho MD 09/20/2024 06:05 AM EST RP Workstation: [id number]   MR LUMBAR SPINE WO CONTRAST Result Date: 09/19/2024 EXAM: MRI LUMBAR SPINE 09/19/2024 04:34:40 PM TECHNIQUE: Multiplanar multisequence MRI of the lumbar spine was performed without the administration of intravenous contrast. COMPARISON: 04/08/2020 CLINICAL HISTORY: Lumbar radiculopathy, symptoms persist with > 6 wks treatment; Myelopathy, acute, lumbar spine. FINDINGS: BONES AND ALIGNMENT: Normal alignment except for slight retrolisthesis at L2-L3 and L3-L4, which is stable. Normal vertebral body heights. Bone marrow signal is unremarkable except for extensive type 2 Modic changes at L2-L3 and L3-L4, and edematous changes that have progressed at L4-L5 and L5-S1. SPINAL CORD: The conus medullaris terminates at L1. SOFT TISSUES: No paraspinal mass. L1-L2: No significant disc herniation. No spinal canal stenosis or neural foraminal narrowing. L2-L3: Uncovertebral broad-based disc protrusion results in stable mild foraminal narrowing bilaterally. A broad-based disc protrusion and prominent epidural fat results in moderate central stenosis. Severe left and moderate right foraminal narrowing demonstrate some progression since the prior study. L3-L4: Broad-based disc protrusion has progressed. Moderate facet hypertrophy has progressed bilaterally. 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 Progressive moderate foraminal stenosis is present bilaterally, right greater than left. L4-L5: Chronic positive disc height is present. Endplate ridging and epidural lipomatosis result in moderate central canal stenosis. Moderate-to-severe right and moderate left foraminal stenosis is stable. L5-S1: Edematous changes have progressed. No significant disc  herniation. No spinal canal stenosis or neural foraminal narrowing. IMPRESSION: 1. 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 2. Moderate central canal stenosis at L2-3 from broad-based disc protrusion and prominent epidural fat. 3. Progressive moderate foraminal stenosis bilaterally at L4-5, right greater than left. 4. Severe left and moderate right foraminal narrowing at L2-3 with some progression since prior study. 5. Progression of edematous changes at L4-5 and L5-S1. 6. Progression of broad-based disc protrusion and moderate facet hypertrophy bilaterally at L4-5. Electronically signed by: Lonni Necessary MD 09/19/2024 06:28 PM EST RP Workstation: [id number]   DG Chest Port 1 View Result Date: 09/19/2024 EXAM: 1 VIEW(S) XRAY OF THE CHEST 09/19/2024 11:45:00 AM COMPARISON: 01/20/2024 CLINICAL HISTORY: shortness of breath FINDINGS: LUNGS AND PLEURA: Probable left retrocardiac opacity concerning for atelectasis or infiltrate. Possible effusion. No pneumothorax. HEART AND MEDIASTINUM: Stable cardiomegaly. No acute abnormality of the mediastinal silhouette. BONES AND SOFT TISSUES: No acute osseous abnormality. IMPRESSION: 1. Left retrocardiac opacity suspicious for atelectasis or infiltrate, with possible effusion. 2. Stable cardiomegaly. Electronically signed by: Lynwood Seip MD 09/19/2024 12:16 PM EST RP Workstation: [id number]   DG Knee Complete 4 Views Right Result Date: 09/19/2024 EXAM: 4 OR MORE VIEW(S) XRAY OF THE RIGHT KNEE 09/19/2024 11:45:00 AM COMPARISON: None available. CLINICAL HISTORY: shortness of breath FINDINGS: BONES AND JOINTS: No acute fracture. No malalignment. Small suprapatellar joint effusion is noted. Minimal narrowing of lateral joint space is noted. SOFT TISSUES: The soft tissues are unremarkable. IMPRESSION: 1. Small suprapatellar joint effusion. 2. Minimal lateral  compartment joint space narrowing. Electronically signed by: Lynwood Seip MD 09/19/2024 12:14 PM EST RP Workstation: [id number]   DG Pelvis 1-2 Views Result Date: 09/19/2024 EXAM: 1 or 2 view(s) Xray of the pelvis 09/19/2024 11:45:00 AM COMPARISON: None available. CLINICAL HISTORY: Shortness of breath FINDINGS: BONES AND JOINTS: No acute fracture. No malalignment. SOFT TISSUES: The soft tissues are unremarkable. IMPRESSION: 1. No significant abnormality. Electronically signed by: Lynwood Seip MD 09/19/2024 12:13 PM EST RP Workstation: [id number]   Intravitreal Injection, Pharmacologic Agent - OD - Right Eye Result Date: 09/04/2024 Time Out 09/04/2024. 2:37 PM. Confirmed correct patient, procedure, site, and patient consented. Anesthesia Topical anesthesia was used. Anesthetic medications included Lidocaine  2%, Proparacaine 0.5%. Procedure Preparation included 5% betadine to ocular surface, eyelid speculum. A supplied needle was used. Injection: 6 mg faricimab -svoa 6 MG/0.05ML (Patient supplied)   Route: Intravitreal, Site: Right Eye   NDC: 49757-903-93, Lot: [id number], Expiration date: 07/14/2025, Waste: 0 mL Post-op Post injection exam found visual acuity of at least counting fingers. The patient tolerated the procedure well. There were no complications. The patient received written and verbal post procedure care education. Post injection medications were not given. Notes **PAP MEDICATION ADMINISTERED**   Intravitreal Injection, Pharmacologic Agent - OS - Left Eye Result Date: 09/04/2024 Time Out 09/04/2024. 2:37 PM. Confirmed correct patient, procedure, site, and patient consented. Anesthesia Topical anesthesia was used. Anesthetic medications included Lidocaine  2%, Proparacaine 0.5%. Procedure Preparation included 5% betadine to ocular surface, eyelid speculum. A supplied (32g) needle was used. Injection: 6 mg faricimab -svoa 6 MG/0.05ML (Patient supplied)   Route: Intravitreal, Site: Left Eye   NDC:  49757-903-93, Lot: [id number], Expiration date: 07/14/2025, Waste: 0 mL Post-op Post injection exam found visual acuity of at least counting fingers. The patient tolerated the procedure well. There were no complications. The patient received written and verbal post procedure care education. Post injection medications were not given. Notes **PAP MEDICATION ADMINISTERED**  OCT, Retina - OU - Both Eyes Result  Date: 09/04/2024 Right Eye Quality was good. Central Foveal Thickness: 269. Progression has improved. Findings include normal foveal contour, no SRF, intraretinal fluid, vitreomacular adhesion (Interval resolution of trace cystic changes ST fovea, partial PVD). Left Eye Quality was good. Central Foveal Thickness: 269. Progression has been stable. Findings include normal foveal contour, no IRF, no SRF, intraretinal hyper-reflective material (persistent cystic changes and punctate IRHM, partial PVD). Notes *Images captured and stored on drive Diagnosis / Impression: +DME OU OD: Interval resolution of trace cystic changes ST fovea,  partial PVD OS: persistent cystic changes and punctate IRHM, partial PVD Clinical management: See below Abbreviations: NFP - Normal foveal profile. CME - cystoid macular edema. PED - pigment epithelial detachment. IRF - intraretinal fluid. SRF - subretinal fluid. EZ - ellipsoid zone. ERM - epiretinal membrane. ORA - outer retinal atrophy. ORT - outer retinal tubulation. SRHM - subretinal hyper-reflective material. IRHM - intraretinal hyper-reflective material   Alm Schneider, DO  [facility]  If 7PM-7AM, please contact night-coverage [email or web address] Password [email or web address] 09/24/2024, 5:58 PM   LOS: 5 days

## 2024-09-24 NOTE — Plan of Care (Signed)
 Problem: Education: Goal: Knowledge of General Education information will improve Description: Including pain rating scale, medication(s)/side effects and non-pharmacologic comfort measures Outcome: Progressing   Problem: Health Behavior/Discharge Planning: Goal: Ability to manage health-related needs will improve Outcome: Progressing   Problem: Clinical Measurements: Goal: Ability to maintain clinical measurements within normal limits will improve Outcome: Progressing Goal: Will remain free from infection Outcome: Progressing Goal: Diagnostic test results will improve Outcome: Progressing Goal: Respiratory complications will improve Outcome: Progressing Goal: Cardiovascular complication will be avoided Outcome: Progressing   Problem: Activity: Goal: Risk for activity intolerance will decrease Outcome: Progressing   Problem: Nutrition: Goal: Adequate nutrition will be maintained Outcome: Progressing   Problem: Coping: Goal: Level of anxiety will decrease Outcome: Progressing   Problem: Elimination: Goal: Will not experience complications related to bowel motility Outcome: Progressing Goal: Will not experience complications related to urinary retention Outcome: Progressing   Problem: Pain Managment: Goal: General experience of comfort will improve and/or be controlled Outcome: Progressing   Problem: Safety: Goal: Ability to remain free from injury will improve Outcome: Progressing   Problem: Skin Integrity: Goal: Risk for impaired skin integrity will decrease Outcome: Progressing   Problem: Education: Goal: Ability to describe self-care measures that may prevent or decrease complications (Diabetes Survival Skills Education) will improve Outcome: Progressing Goal: Individualized Educational Video(s) Outcome: Progressing   Problem: Coping: Goal: Ability to adjust to condition or change in health will improve Outcome: Progressing   Problem: Fluid  Volume: Goal: Ability to maintain a balanced intake and output will improve Outcome: Progressing   Problem: Health Behavior/Discharge Planning: Goal: Ability to identify and utilize available resources and services will improve Outcome: Progressing Goal: Ability to manage health-related needs will improve Outcome: Progressing   Problem: Metabolic: Goal: Ability to maintain appropriate glucose levels will improve Outcome: Progressing   Problem: Nutritional: Goal: Maintenance of adequate nutrition will improve Outcome: Progressing Goal: Progress toward achieving an optimal weight will improve Outcome: Progressing   Problem: Skin Integrity: Goal: Risk for impaired skin integrity will decrease Outcome: Progressing   Problem: Tissue Perfusion: Goal: Adequacy of tissue perfusion will improve Outcome: Progressing   Problem: Education: Goal: Ability to demonstrate management of disease process will improve Outcome: Progressing Goal: Ability to verbalize understanding of medication therapies will improve Outcome: Progressing Goal: Individualized Educational Video(s) Outcome: Progressing   Problem: Activity: Goal: Capacity to carry out activities will improve Outcome: Progressing   Problem: Cardiac: Goal: Ability to achieve and maintain adequate cardiopulmonary perfusion will improve Outcome: Progressing   Problem: Education: Goal: Knowledge of disease or condition will improve Outcome: Progressing Goal: Understanding of medication regimen will improve Outcome: Progressing Goal: Individualized Educational Video(s) Outcome: Progressing   Problem: Activity: Goal: Ability to tolerate increased activity will improve Outcome: Progressing   Problem: Cardiac: Goal: Ability to achieve and maintain adequate cardiopulmonary perfusion will improve Outcome: Progressing   Problem: Health Behavior/Discharge Planning: Goal: Ability to safely manage health-related needs after  discharge will improve Outcome: Progressing   Problem: Education: Goal: Knowledge of disease and its progression will improve Outcome: Progressing Goal: Individualized Educational Video(s) Outcome: Progressing   Problem: Fluid Volume: Goal: Compliance with measures to maintain balanced fluid volume will improve Outcome: Progressing   Problem: Health Behavior/Discharge Planning: Goal: Ability to manage health-related needs will improve Outcome: Progressing   Problem: Nutritional: Goal: Ability to make healthy dietary choices will improve Outcome: Progressing   Problem: Clinical Measurements: Goal: Complications related to the disease process, condition or treatment will be avoided or minimized Outcome: Progressing

## 2024-09-24 NOTE — Progress Notes (Signed)
 Rounding Note   Patient Name: Lucas Dudley Date of Encounter: 09/24/2024  [facility] Cardiologist: [name], MD   Subjective Some ongoing SOB  Scheduled Meds:  acetaminophen   650 mg Oral Q6H   Or   acetaminophen   650 mg Rectal Q6H   aspirin   81 mg Oral Daily   brimonidine   1 drop Both Eyes BID   And   timolol   1 drop Both Eyes BID   Chlorhexidine  Gluconate Cloth  6 each Topical Daily   dapagliflozin  propanediol  10 mg Oral Daily   diltiazem   30 mg Oral Q6H   dorzolamide   1 drop Both Eyes BID   enoxaparin  (LOVENOX ) injection  40 mg Subcutaneous Q24H   furosemide   60 mg Intravenous BID   gabapentin   300 mg Oral QHS   insulin  aspart  0-9 Units Subcutaneous TID WC   insulin  aspart  4 Units Subcutaneous TID WC   insulin  glargine  30 Units Subcutaneous QHS   liver oil-zinc  oxide   Topical BID   pantoprazole   40 mg Oral QPM   Continuous Infusions:  PRN Meds: bisacodyl , fentaNYL  (SUBLIMAZE ) injection, ipratropium-albuterol , LORazepam , ondansetron  **OR** ondansetron  (ZOFRAN ) IV, mouth rinse, oxyCODONE , traZODone    Vital Signs  Vitals:   09/23/24 0500 09/23/24 2204 09/24/24 0540 09/24/24 0630  BP:  (!) 147/70  124/68  Pulse:  88  93  Resp:  16  20  Temp:  98.7 F (37.1 C)  98.9 F (37.2 C)  TempSrc:  Oral  Oral  SpO2:  93%  100%  Weight: 102.7 kg  102.1 kg   Height:        Intake/Output Summary (Last 24 hours) at 09/24/2024 0900 Last data filed at 09/24/2024 9180 Gross per 24 hour  Intake 118 ml  Output 2400 ml  Net -2282 ml      09/24/2024    5:40 AM 09/23/2024    5:00 AM 09/22/2024    4:19 AM  Last 3 Weights  Weight (lbs) 225 lb 1.4 oz 226 lb 6.6 oz 228 lb 6.4 oz  Weight (kg) 102.1 kg 102.7 kg 103.602 kg      Telemetry Rate controlled afib - Personally Reviewed  ECG  N/a - Personally Reviewed  Physical Exam  GEN: No acute distress.   Neck: No JVD Cardiac: irreg  Respiratory: crackles bilaterally GI: Soft, nontender,  non-distended  MS: 1+ bilateral LE edema. Neuro:  Nonfocal  Psych: Normal affect   Labs High Sensitivity Troponin:  No results for input(s): TROPONINIHS in the last 720 hours.   Chemistry Recent Labs  Lab 09/19/24 1128 09/20/24 0503 09/21/24 0335 09/22/24 0448 09/23/24 0518 09/24/24 0624  NA 137 138 134* 140 141 140  K 3.6 2.7* 3.5 3.1* 3.8 3.4*  CL 91* 95* 95* 98 100 98  CO2 23 28 33* 35* 37* 39*  GLUCOSE 239* 137* 118* 109* 153* 145*  BUN 34* 38* 39* 29* 24* 21  CREATININE 1.44* 1.25* 1.19 1.18 1.25* 1.21  CALCIUM  8.9 8.3* 8.3* 8.3* 8.6* 8.5*  MG  --  2.4  --  2.2  --  2.4  PROT 7.1 5.9* 6.0*  --   --   --   ALBUMIN 3.6 3.0* 3.0*  --   --   --   AST 49* 28 27  --   --   --   ALT 14 13 13   --   --   --   ALKPHOS 107 83 98  --   --   --  BILITOT 2.2* 1.0 0.8  --   --   --   GFRNONAA 49* 58* >60 >60 58* >60  ANIONGAP 23* 15 6 7 5  3*    Lipids No results for input(s): CHOL, TRIG, HDL, LABVLDL, LDLCALC, CHOLHDL in the last 168 hours.  Hematology Recent Labs  Lab 09/21/24 0335 09/22/24 0448 09/23/24 0518  WBC 13.2* 10.2 11.2*  RBC 4.36 4.81 4.69  HGB 10.5* 11.4* 11.1*  HCT 35.3* 39.8 39.5  MCV 81.0 82.7 84.2  MCH 24.1* 23.7* 23.7*  MCHC 29.7* 28.6* 28.1*  RDW 17.9* 18.1* 18.5*  PLT 234 308 363   Thyroid  No results for input(s): TSH, FREET4 in the last 168 hours.  BNP Recent Labs  Lab 09/19/24 1128  PROBNP 4,082.0*    DDimer No results for input(s): DDIMER in the last 168 hours.   Radiology  No results found.    Patient Profile   Lucas Dudley is a 83 y.o. 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   Assessment & Plan   1. PAF - has watchman device - RVR on admission. Was found down at home after fall not able to get up, did not get his meds 1-2 days at home.   - RVR in setting of fall, pneumonia   - initially on dilt drip, now off. Currently on oral dilt 30mg  every 6 hours.  - rates well controlled on current regimen. Of note at home was self rate controlled.  - continue short acting dilt at this time and follow rates, may consolidate to long acting at dicharge if still requiring.    2.Pneumonia - abx per primary team   3.Acute on chronic HFpEF - 12/2023 echo: LVEF 55-60%, indet diastolic, mild RV dysfunction, severe pulm HTN PASP 63, dilated fixed IVC,  01/2024 limited echo: LVEF 55-60%, no WMAs, indet diastolic function, mild RV dysfunction, mod pulm HTN PASP 47.   Elevated proBNP, CXR without clear edema. Has chronic LE edema. Body habitus somewhat difficult to assess volume status otherwise  -received IV lasix  60mg  x 2 yesterday, I/Os incomplete. Roughly negative 2.1 L. Wt 228-->226-->225 lbs. Downtrend in Cr with diuresis consistent with venous congestion and HF.  - remains fluid overloaded, dose IV lasix  80mg  bid today and reassess    4.CAD -01/2024 cath: mod nonobstructive CAD, mean PA 32, PCWP 16, PVR 2.7, CI 2.59 - no acute issues     5. Pulmonary HTN - 12/2023 echo: LVEF 55-60%, indet diastolic, mild RV dysfunction, severe pulm HTN PASP 63, dilated fixed IVC,  - has history of OSA, HFpEF. Unclear if any additional etiologies. 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    6. Pneumonia - per primary team   For questions or updates, please contact [facility] Please consult [email or web address] for contact info under       Signed, Alvan Carrier, MD  09/24/2024, 9:00 AM

## 2024-09-24 NOTE — Progress Notes (Signed)
 Mobility Specialist Progress Note:    09/24/24 1125  Mobility  Activity Respositioned in chair  Level of Assistance Independent  Assistive Device None  Distance Ambulated (ft) 0 ft  Range of Motion/Exercises Active;All extremities  Activity Response Tolerated well  Mobility Referral Yes  Mobility visit 1 Mobility  Mobility Specialist Start Time (ACUTE ONLY) 1125  Mobility Specialist Stop Time (ACUTE ONLY) 1145  Mobility Specialist Time Calculation (min) (ACUTE ONLY) 20 min   Pt received in chair, agreeable to do some exercises. Performed seated heel toe raises, seated marches, and shoulder pronation exercise. Each exercise was performed for 1 set of 10 reps. Tolerated well, asx throughout. All needs met.  Norvin Ohlin Mobility Specialist Please contact via Special Educational Needs Teacher or  Rehab office at [phone]

## 2024-09-25 DIAGNOSIS — I4819 Other persistent atrial fibrillation: Secondary | ICD-10-CM | POA: Diagnosis not present

## 2024-09-25 DIAGNOSIS — I5033 Acute on chronic diastolic (congestive) heart failure: Secondary | ICD-10-CM

## 2024-09-25 DIAGNOSIS — A419 Sepsis, unspecified organism: Secondary | ICD-10-CM

## 2024-09-25 LAB — MAGNESIUM: Magnesium: 2 mg/dL (ref 1.7–2.4)

## 2024-09-25 LAB — BASIC METABOLIC PANEL WITH GFR
Anion gap: 5 (ref 5–15)
BUN: 18 mg/dL (ref 8–23)
CO2: 38 mmol/L — ABNORMAL HIGH (ref 22–32)
Calcium: 8.6 mg/dL — ABNORMAL LOW (ref 8.9–10.3)
Chloride: 97 mmol/L — ABNORMAL LOW (ref 98–111)
Creatinine, Ser: 1.07 mg/dL (ref 0.61–1.24)
GFR, Estimated: >60 mL/min (ref >60)
Glucose, Bld: 122 mg/dL — ABNORMAL HIGH (ref 70–99)
Potassium: 3.6 mmol/L (ref 3.5–5.1)
Sodium: 141 mmol/L (ref 135–145)

## 2024-09-25 LAB — GLUCOSE, CAPILLARY
Glucose-Capillary: 195 mg/dL — ABNORMAL HIGH (ref 70–99)
Glucose-Capillary: 93 mg/dL (ref 70–99)
Glucose-Capillary: 93 mg/dL (ref 70–99)
Glucose-Capillary: 113 mg/dL — ABNORMAL HIGH (ref 70–99)
Glucose-Capillary: 120 mg/dL — ABNORMAL HIGH (ref 70–99)
Glucose-Capillary: 129 mg/dL — ABNORMAL HIGH (ref 70–99)

## 2024-09-25 MED ORDER — POTASSIUM CHLORIDE CRYS ER 20 MEQ PO TBCR
40.0000 meq | EXTENDED_RELEASE_TABLET | Freq: Four times a day (QID) | ORAL | Status: AC
  Administered 2024-09-25 (×2): 40 meq via ORAL
  Filled 2024-09-25 (×2): qty 2

## 2024-09-25 MED ORDER — FUROSEMIDE 10 MG/ML IJ SOLN
80.0000 mg | Freq: Two times a day (BID) | INTRAMUSCULAR | Status: DC
  Administered 2024-09-25 – 2024-09-28 (×7): 80 mg via INTRAVENOUS
  Filled 2024-09-25 (×7): qty 8

## 2024-09-25 MED ADMIN — Diltiazem HCl Tab 60 MG: 60 mg | ORAL | NDC 50228048201

## 2024-09-25 MED FILL — Diltiazem HCl Tab 60 MG: 60.0000 mg | ORAL | Qty: 1 | Status: AC

## 2024-09-25 NOTE — Progress Notes (Signed)
°   09/25/24 2307  BiPAP/CPAP/SIPAP  BiPAP/CPAP/SIPAP Pt Type Adult  BiPAP/CPAP/SIPAP DREAMSTATIOND  Mask Type Nasal pillows  Dentures removed? Not applicable  Mask Size Large  EPAP 8 cmH2O  Flow Rate 4 lpm  Patient Home Machine No  Patient Home Mask Yes  Patient Home Tubing No  Auto Titrate No  Device Plugged into RED Power Outlet Yes  BiPAP/CPAP /SiPAP Vitals  Pulse Rate 76  Resp 17  SpO2 92 %  Bilateral Breath Sounds Clear;Diminished  MEWS Score/Color  MEWS Score 0  MEWS Score Color Landy

## 2024-09-25 NOTE — Care Management Important Message (Signed)
 Important Message  Patient Details  Name: Lucas Dudley MRN: [id number] Date of Birth: 12/08/1940   Important Message Given:  Yes - Medicare IM     Khyre Germond L Anelisse Jacobson 09/25/2024, 2:32 PM

## 2024-09-25 NOTE — Plan of Care (Signed)
°  Problem: Education: Goal: Knowledge of General Education information will improve Description: Including pain rating scale, medication(s)/side effects and non-pharmacologic comfort measures Outcome: Progressing   Problem: Health Behavior/Discharge Planning: Goal: Ability to manage health-related needs will improve Outcome: Progressing   Problem: Clinical Measurements: Goal: Ability to maintain clinical measurements within normal limits will improve Outcome: Progressing Goal: Will remain free from infection Outcome: Progressing Goal: Diagnostic test results will improve Outcome: Progressing Goal: Respiratory complications will improve Outcome: Progressing Goal: Cardiovascular complication will be avoided Outcome: Progressing   Problem: Activity: Goal: Risk for activity intolerance will decrease Outcome: Progressing   Problem: Nutrition: Goal: Adequate nutrition will be maintained Outcome: Progressing   Problem: Coping: Goal: Level of anxiety will decrease Outcome: Progressing   Problem: Pain Managment: Goal: General experience of comfort will improve and/or be controlled Outcome: Progressing   Problem: Safety: Goal: Ability to remain free from injury will improve Outcome: Progressing   Problem: Skin Integrity: Goal: Risk for impaired skin integrity will decrease Outcome: Progressing   Problem: Fluid Volume: Goal: Ability to maintain a balanced intake and output will improve Outcome: Progressing   Problem: Nutritional: Goal: Maintenance of adequate nutrition will improve Outcome: Progressing Goal: Progress toward achieving an optimal weight will improve Outcome: Progressing   Problem: Education: Goal: Ability to demonstrate management of disease process will improve Outcome: Progressing Goal: Ability to verbalize understanding of medication therapies will improve Outcome: Progressing Goal: Individualized Educational Video(s) Outcome: Progressing    Problem: Tissue Perfusion: Goal: Adequacy of tissue perfusion will improve Outcome: Progressing   Problem: Education: Goal: Knowledge of disease or condition will improve Outcome: Progressing Goal: Understanding of medication regimen will improve Outcome: Progressing Goal: Individualized Educational Video(s) Outcome: Progressing

## 2024-09-25 NOTE — Progress Notes (Signed)
 Rounding Note   Patient Name: Lucas Dudley Date of Encounter: 09/25/2024  [facility] Cardiologist: [name], MD   Subjective No complaints  Scheduled Meds:  acetaminophen   650 mg Oral Q6H   Or   acetaminophen   650 mg Rectal Q6H   aspirin   81 mg Oral Daily   brimonidine   1 drop Both Eyes BID   And   timolol   1 drop Both Eyes BID   Chlorhexidine  Gluconate Cloth  6 each Topical Daily   dapagliflozin  propanediol  10 mg Oral Daily   diltiazem   30 mg Oral Q6H   dorzolamide   1 drop Both Eyes BID   enoxaparin  (LOVENOX ) injection  40 mg Subcutaneous Q24H   gabapentin   100 mg Oral QHS   insulin  aspart  0-9 Units Subcutaneous TID WC   insulin  aspart  2 Units Subcutaneous TID WC   insulin  glargine  30 Units Subcutaneous QHS   liver oil-zinc  oxide   Topical BID   pantoprazole   40 mg Oral QPM   Continuous Infusions:  PRN Meds: bisacodyl , ipratropium-albuterol , LORazepam , ondansetron  **OR** ondansetron  (ZOFRAN ) IV, mouth rinse, traZODone    Vital Signs  Vitals:   09/24/24 1342 09/24/24 1944 09/25/24 0250 09/25/24 0532  BP: 107/75 (!) 144/83  (!) 143/71  Pulse: 65 74  74  Resp:  18    Temp:  97.7 F (36.5 C)  97.8 F (36.6 C)  TempSrc:  Oral  Oral  SpO2: 98% 94%  98%  Weight:   106.2 kg   Height:        Intake/Output Summary (Last 24 hours) at 09/25/2024 0956 Last data filed at 09/25/2024 0532 Gross per 24 hour  Intake 480 ml  Output 4200 ml  Net -3720 ml      09/25/2024    2:50 AM 09/24/2024    5:40 AM 09/23/2024    5:00 AM  Last 3 Weights  Weight (lbs) 234 lb 1.6 oz 225 lb 1.4 oz 226 lb 6.6 oz  Weight (kg) 106.187 kg 102.1 kg 102.7 kg      Telemetry Rate controlled afib - Personally Reviewed  ECG  N/a - Personally Reviewed  Physical Exam  GEN: No acute distress.   Neck: No JVD Cardiac: irreg Respiratory: crackles bilaterally GI: Soft, nontender, non-distended  MS: 2+ bilateral LE edema Neuro:  Nonfocal  Psych: Normal affect    Labs High Sensitivity Troponin:  No results for input(s): TROPONINIHS in the last 720 hours.  Recent Labs  Lab 09/19/24 1128 09/19/24 1325  TRNPT 91* 84*       Chemistry Recent Labs  Lab 09/19/24 1128 09/19/24 1128 09/20/24 0503 09/21/24 0335 09/22/24 0448 09/23/24 0518 09/24/24 0624 09/25/24 0501  NA 137  --  138 134* 140 141 140 141  K 3.6  --  2.7* 3.5 3.1* 3.8 3.4* 3.6  CL 91*  --  95* 95* 98 100 98 97*  CO2 23  --  28 33* 35* 37* 39* 38*  GLUCOSE 239*  --  137* 118* 109* 153* 145* 122*  BUN 34*  --  38* 39* 29* 24* 21 18  CREATININE 1.44*  --  1.25* 1.19 1.18 1.25* 1.21 1.07  CALCIUM  8.9  --  8.3* 8.3* 8.3* 8.6* 8.5* 8.6*  MG  --    < > 2.4  --  2.2  --  2.4 2.0  PROT 7.1  --  5.9* 6.0*  --   --   --   --   ALBUMIN  3.6  --  3.0* 3.0*  --   --   --   --   AST 49*  --  28 27  --   --   --   --   ALT 14  --  13 13  --   --   --   --   ALKPHOS 107  --  83 98  --   --   --   --   BILITOT 2.2*  --  1.0 0.8  --   --   --   --   GFRNONAA 49*  --  58* >60 >60 58* >60 >60  ANIONGAP 23*  --  15 6 7 5  3* 5   < > = values in this interval not displayed.    Lipids No results for input(s): CHOL, TRIG, HDL, LABVLDL, LDLCALC, CHOLHDL in the last 168 hours.  Hematology Recent Labs  Lab 09/21/24 0335 09/22/24 0448 09/23/24 0518  WBC 13.2* 10.2 11.2*  RBC 4.36 4.81 4.69  HGB 10.5* 11.4* 11.1*  HCT 35.3* 39.8 39.5  MCV 81.0 82.7 84.2  MCH 24.1* 23.7* 23.7*  MCHC 29.7* 28.6* 28.1*  RDW 17.9* 18.1* 18.5*  PLT 234 308 363   Thyroid  No results for input(s): TSH, FREET4 in the last 168 hours.  BNP Recent Labs  Lab 09/19/24 1128  PROBNP 4,082.0*    DDimer No results for input(s): DDIMER in the last 168 hours.   Radiology  No results found.    Patient Profile   Lucas Dudley is a 83 y.o. male with a hx of chronic HFpEF, CAD mod nonobstructive disease, pulm HTN persistent afib with watchman device, OSA, DM who is being seen 09/21/2024 for the  evaluation of afib with RVR at the request of Dr Vicci.   Assessment & Plan   1. PAF - has watchman device - RVR on admission. Was found down at home after fall not able to get up, did not get his meds 1-2 days at home.  - RVR in setting of fall, pneumonia   - initially on dilt drip, now off. Currently on oral dilt 30mg  every 6 hours.  - rates well controlled on current regimen. Of note at home was self rate controlled. Rates controlled on current regimen, will transition to 60mg  bid. If long term need could transition to long acting in the future.    2.Pneumonia - abx per primary team   3.Acute on chronic HFpEF - 12/2023 echo: LVEF 55-60%, indet diastolic, mild RV dysfunction, severe pulm HTN PASP 63, dilated fixed IVC,  01/2024 limited echo: LVEF 55-60%, no WMAs, indet diastolic function, mild RV dysfunction, mod pulm HTN PASP 47.   Elevated proBNP, CXR without clear edema. Has chronic LE edema. Body habitus somewhat difficult to assess volume status otherwise   -received IV lasix  80mg  x 2 yesterday, I/Os incomplete. Roughly negative 3.5 L yesterday. Wt 228-->226-->225 lbs prior trends, this AMs weight of 234 inaccurate will recheck. Downtrend in Cr with diuresis consistent with venous congestion and HF. Weaned off O2, sats 98% on RA - remains fluid overloaded, continue IV lasix  80mg  bid.      4.CAD -01/2024 cath: mod nonobstructive CAD, mean PA 32, PCWP 16, PVR 2.7, CI 2.59 - no acute issues     5. Pulmonary HTN - 12/2023 echo: LVEF 55-60%, indet diastolic, mild RV dysfunction, severe pulm HTN PASP 63, dilated fixed IVC,  - has history of OSA, HFpEF. Unclear if any additional etiologies. 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    6. Pneumonia - per primary team     For questions or updates, please contact [facility] Please consult [email or web address] for contact info under        Signed, Alvan Carrier, MD  09/25/2024, 9:56 AM

## 2024-09-25 NOTE — Progress Notes (Signed)
 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  He apparently had a fall about 4 days ago at home.  He did not seek medical treatment at the time.  He fell again 2 days ago and was not able to get up and had been on the floor for about 1-2 days.  He has been having difficulty for past few months with lower extremity weakness and his legs giving out.  They have not been able to determine the cause of this.  His daughter called EMS to check on him because she had not heard from him in a couple of days.  EMS found him to have right knee swelling.  He complained of shortness of breath and knee pain.  He has not had his medications in a couple of days.  The ED found him to be tachycardic in A-fib with RVR and was hypoxic and placed on 3 L nasal cannula.  His chest x-ray was suspicious for atelectasis and/or pneumonia.  He was started on IV diltiazem  infusion, IV antibiotics, Lasix , IV fluids.  He was noted to be tachypneic and had normal venous pH.  His blood sugar was 239.  His CK was 518.  Creatinine 1.44, GFR 48.5 mL/min and troponin 91.  White blood cell count 17.4, hemoglobin 11.9, platelets 271.  Bilirubin 2.2.  His trauma workup revealed normal pelvis imaging, right knee imaging reveals small suprapatellar joint effusion.  Admission requested for further management.   Assessment/Plan:  Paroxysmal Atrial fibrillation with RVR -- he was started on IV diltiazem  infusion in ED -- admitted to stepdown ICU --- transfer to telemetry bed  -- weaned off  diltiazem  infusion to oral Cardizem  30 mg every 6 hours  -- he is s/p watchman device as he could not tolerate full anticoagulation -- he previously had controlled rate Afib without AV nodal agent per cardiology notes PTA - continue short acting diltiazem     Acute on Chronic HFpEF -- ReDS vest 40% on 12/9 -- lasix  increased to 80 mg IV bid -- cardiology team started him on IV furosemide  injection  - down to 228>>>222 lbs - I/O incomplete, but NEG 3.5L 12/11   Lobar  pneumonia Sepsis ruled out  -- 12/7 CXR with LLL opacity -- added incentive spirometry -- IV antibiotics--finished 5 days doxy/ceftriaxone    Generalized weakness Adult failure to thrive  Lumbar radiculopathy with leg weakness -- discussed with daughter symptoms of leg weakness progressing and requested MRI L spine -- if patient can tolerate will try to obtain MRI L spine without contrast  -- MRI with findings of 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  Will obtain PT evaluation.  MRI brain ordered to rule out acute CVA -- MRI brain negative for CVA -- Pt recommending SNF and patient is agreeable.   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 -- continue semglee  30 units and novolog  2 units with meals -- SSI coverage and frequent CBG monitoring ordered -- 09/19/24 A1c 11.1%    Acute urinary retention -- I/O caths done in ED -- temporary foley cath placement ordered -- removed foley 12/8 and  he has been voiding    Essential hypertension  -- weaned off IV diltiazem  infusion to oral diltiazem  30 mg every 6 hours     Hyperlipidemia -- he is reportedly intolerent to statins  --  cardiology had started him on zetia  daily previously but he now says he doesn't take it    Stage 3a CKD -- baseline creatinine 1.1-1.4 - monitor with diuresis   OSA on CPAP -- nightly CPAP ordered   Iron  deficiency anemia -- he is followed by hematology  -- he is no longer on octreotide  injections -- he is receiving IV iron   infusions.  He received IV iron  12/8 in hospital   CAD -- recent cath demonstrated 40% proximal to mid RCA stenosis and 60% PDA stenosis managed with medical therapy by cardiology.  -- he is on aspirin  81 mg daily  - no chest pain   Obesity -BMI 32.49 -lifestyle modification   Hypokalemia -replete -mag - 2.0                 Family Communication:   daughter at bedside 12/10   Consultants:  cardiology   Code Status:  FULL    DVT Prophylaxis:    Lovenox      Procedures: As Listed in Progress Note Above   Antibiotics: Ceftriaxone  12/6>>12/10 Doxy 12/6>12/10         Subjective: Patient denies fevers, chills, headache, chest pain, dyspnea, nausea, vomiting, diarrhea, abdominal pain,   Objective: Vitals:   09/25/24 0250 09/25/24 0532 09/25/24 1100 09/25/24 1423  BP:  (!) 143/71  122/77  Pulse:  74  64  Resp:      Temp:  97.8 F (36.6 C)  (!) 97.4 F (36.3 C)  TempSrc:  Oral  Oral  SpO2:  98%  97%  Weight: 106.2 kg  100.7 kg   Height:        Intake/Output Summary (Last 24 hours) at 09/25/2024 1720 Last data filed at 09/25/2024 0532 Gross per 24 hour  Intake --  Output 2650 ml  Net -2650 ml   Weight change: 4.087 kg Exam:  General:  Pt is alert, follows commands appropriately, not in acute distress HEENT: No icterus, No thrush, No neck mass, [place]/AT Cardiovascular: RRR, S1/S2, no rubs, no gallops Respiratory: bibasilar rales. No wheeze Abdomen: Soft/+BS, non tender, non distended, no guarding Extremities: 2 + LE edema, No lymphangitis, No petechiae, No rashes, no synovitis   Data Reviewed: I have personally reviewed following labs and imaging studies Basic Metabolic Panel: Recent Labs  Lab 09/20/24 0503 09/21/24 0335 09/22/24 0448 09/23/24 0518 09/24/24 0624 09/25/24 0501  NA 138 134* 140 141 140 141  K 2.7* 3.5 3.1* 3.8 3.4* 3.6  CL 95* 95* 98 100 98 97*  CO2 28 33* 35* 37* 39* 38*  GLUCOSE 137* 118* 109* 153* 145* 122*  BUN 38*  39* 29* 24* 21 18  CREATININE 1.25* 1.19 1.18 1.25* 1.21 1.07  CALCIUM  8.3* 8.3* 8.3* 8.6* 8.5* 8.6*  MG 2.4  --  2.2  --  2.4 2.0   Liver Function Tests: Recent Labs  Lab 09/19/24 1128 09/20/24 0503 09/21/24 0335  AST 49* 28 27  ALT 14 13 13   ALKPHOS 107 83 98  BILITOT 2.2* 1.0 0.8  PROT 7.1 5.9* 6.0*  ALBUMIN 3.6 3.0* 3.0*   No results for input(s): LIPASE, AMYLASE in the last 168 hours. No results for input(s): AMMONIA in the last 168 hours. Coagulation Profile: No results for input(s): INR, PROTIME  in the last 168 hours. CBC: Recent Labs  Lab 09/19/24 1128 09/20/24 0503 09/21/24 0335 09/22/24 0448 09/23/24 0518  WBC 17.4* 12.4* 13.2* 10.2 11.2*  NEUTROABS 13.7* 10.3* 11.0* 7.9* 8.6*  HGB 11.9* 10.4* 10.5* 11.4* 11.1*  HCT 41.0 35.1* 35.3* 39.8 39.5  MCV 82.5 81.3 81.0 82.7 84.2  PLT 271 229 234 308 363   Cardiac Enzymes: Recent Labs  Lab 09/19/24 1128 09/20/24 0503  CKTOTAL 518* 281   BNP: Invalid input(s): POCBNP CBG: Recent Labs  Lab 09/25/24 0222 09/25/24 0734 09/25/24 1109 09/25/24 1631 09/25/24 1711  GLUCAP 195* 93 93 120* 113*   HbA1C: No results for input(s): HGBA1C in the last 72 hours. Urine analysis:    Component Value Date/Time   COLORURINE YELLOW 09/19/2024 1124   APPEARANCEUR CLEAR 09/19/2024 1124   APPEARANCEUR Clear 04/09/2024 1623   LABSPEC 1.021 09/19/2024 1124   PHURINE 5.0 09/19/2024 1124   GLUCOSEU >=500 (A) 09/19/2024 1124   HGBUR MODERATE (A) 09/19/2024 1124   BILIRUBINUR NEGATIVE 09/19/2024 1124   BILIRUBINUR Negative 04/09/2024 1623   KETONESUR 5 (A) 09/19/2024 1124   PROTEINUR 30 (A) 09/19/2024 1124   NITRITE NEGATIVE 09/19/2024 1124   LEUKOCYTESUR NEGATIVE 09/19/2024 1124   Sepsis Labs: @LABRCNTIP (procalcitonin:4,lacticidven:4) ) Recent Results (from the past 240 hours)  Blood culture (routine x 2)     Status: None (Preliminary result)   Collection Time: 09/19/24 12:51 PM   Specimen: BLOOD  RIGHT ARM  Result Value Ref Range Status   Specimen Description BLOOD RIGHT ARM  Final   Special Requests Blood Culture adequate volume  Final   Culture  Setup Time   Final    ANAEROBIC BOTTLE ONLY GRAM POSITIVE RODS Gram Stain Report Called to,Read Back By and Verified With: C FULCHER AT 1556 ON 12.11.25 BY ADGER J  Performed at [facility], [address], [place], [place] [address]    Culture   Final    GRAM POSITIVE RODS CORRECTED ON 12/11 AT 1556: PREVIOUSLY REPORTED AS NO GROWTH 5 DAYS   Report Status PENDING  Incomplete  Blood culture (routine x 2)     Status: None   Collection Time: 09/19/24 12:56 PM   Specimen: Left Antecubital; Blood  Result Value Ref Range Status   Specimen Description LEFT ANTECUBITAL  Final   Special Requests Blood Culture adequate volume  Final   Culture   Final    NO GROWTH 5 DAYS Performed at [facility], [address], [place], [place] [address]    Report Status 09/24/2024 FINAL  Final  MRSA Next Gen by PCR, Nasal     Status: None   Collection Time: 09/19/24  2:05 PM   Specimen: Nasal Mucosa; Nasal Swab  Result Value Ref Range Status   MRSA by PCR Next Gen NOT DETECTED NOT DETECTED Final    Comment: (NOTE) The GeneXpert MRSA Assay (FDA approved for NASAL specimens only), is one component of a comprehensive MRSA colonization surveillance program. It is not intended to diagnose MRSA infection nor to guide or monitor treatment for MRSA infections. Test performance is not FDA approved in patients less than 79 years old. Performed at [facility], [address], [place], [place] [address]      Scheduled Meds:  acetaminophen   650 mg Oral Q6H   Or   acetaminophen   650 mg Rectal Q6H   aspirin   81 mg Oral Daily   brimonidine   1 drop Both Eyes BID   And   timolol   1  drop Both Eyes BID   Chlorhexidine  Gluconate Cloth  6 each Topical Daily   dapagliflozin  propanediol  10 mg Oral Daily   diltiazem   60 mg Oral BID   dorzolamide   1 drop Both Eyes  BID   enoxaparin  (LOVENOX ) injection  40 mg Subcutaneous Q24H   furosemide   80 mg Intravenous BID   gabapentin   100 mg Oral QHS   insulin  aspart  0-9 Units Subcutaneous TID WC   insulin  aspart  2 Units Subcutaneous TID WC   insulin  glargine  30 Units Subcutaneous QHS   liver oil-zinc  oxide   Topical BID   pantoprazole   40 mg Oral QPM   potassium chloride   40 mEq Oral Q6H   Continuous Infusions:  Procedures/Studies: MR BRAIN WO CONTRAST Result Date: 09/20/2024 EXAM: MRI BRAIN WITHOUT CONTRAST 09/20/2024 02:20:10 PM TECHNIQUE: Multiplanar multisequence MRI of the head/brain was performed without the administration of intravenous contrast. COMPARISON: CT head 10/18/2018 CLINICAL HISTORY: Neuro deficit, acute, stroke suspected. Generalized weakness, rule out acute Cerebrovascular Accident. FINDINGS: BRAIN AND VENTRICLES: No acute infarct. No intracranial hemorrhage. No mass. No midline shift. No hydrocephalus. Mild for age chronic microvascular ischemic change. A few small remote left frontal cortical infarcts. Small remote right cerebellar infarct. Normal flow voids. ORBITS: No acute abnormality. SINUSES AND MASTOIDS: No acute abnormality. BONES AND SOFT TISSUES: Normal marrow signal. No acute soft tissue abnormality. IMPRESSION: 1. No acute intracranial abnormality. Electronically signed by: Gilmore Molt MD 09/20/2024 03:15 PM EST RP Workstation: [id number]   DG CHEST PORT 1 VIEW Result Date: 09/20/2024 EXAM: 1 VIEW(S) XRAY OF THE CHEST 09/20/2024 05:29:00 AM COMPARISON: 09/19/2024 CLINICAL HISTORY: 08790 Atelectasis 91209; 10031 Cough 10031 FINDINGS: LUNGS AND PLEURA: Low lung volumes. Possible left retrocardiac opacity, favored to reflect atelectasis, but difficult to exclude superimposed airspace opacity. Decreased left pleural effusion. Trace right pleural effusion. No pneumothorax. HEART AND MEDIASTINUM: Cardiomegaly. Atherosclerotic calcifications. BONES AND SOFT TISSUES: No acute osseous  abnormality. IMPRESSION: 1. Decreased left pleural effusion and trace right pleural effusion. 2. Possible left retrocardiac opacity, favored to reflect atelectasis, but difficult to exclude superimposed airspace opacity. 3. Cardiomegaly and atherosclerotic calcifications. Electronically signed by: Evalene Coho MD 09/20/2024 06:05 AM EST RP Workstation: [id number]   MR LUMBAR SPINE WO CONTRAST Result Date: 09/19/2024 EXAM: MRI LUMBAR SPINE 09/19/2024 04:34:40 PM TECHNIQUE: Multiplanar multisequence MRI of the lumbar spine was performed without the administration of intravenous contrast. COMPARISON: 04/08/2020 CLINICAL HISTORY: Lumbar radiculopathy, symptoms persist with > 6 wks treatment; Myelopathy, acute, lumbar spine. FINDINGS: BONES AND ALIGNMENT: Normal alignment except for slight retrolisthesis at L2-L3 and L3-L4, which is stable. Normal vertebral body heights. Bone marrow signal is unremarkable except for extensive type 2 Modic changes at L2-L3 and L3-L4, and edematous changes that have progressed at L4-L5 and L5-S1. SPINAL CORD: The conus medullaris terminates at L1. SOFT TISSUES: No paraspinal mass. L1-L2: No significant disc herniation. No spinal canal stenosis or neural foraminal narrowing. L2-L3: Uncovertebral broad-based disc protrusion results in stable mild foraminal narrowing bilaterally. A broad-based disc protrusion and prominent epidural fat results in moderate central stenosis. Severe left and moderate right foraminal narrowing demonstrate some progression since the prior study. L3-L4: Broad-based disc protrusion has progressed. Moderate facet hypertrophy has progressed bilaterally. 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 Progressive moderate foraminal stenosis is present bilaterally, right greater than left. L4-L5: Chronic positive disc height is present. Endplate ridging and epidural lipomatosis result in moderate central canal stenosis. Moderate-to-severe right and  moderate left foraminal stenosis is stable. L5-S1:  Edematous changes have progressed. No significant disc herniation. No spinal canal stenosis or neural foraminal narrowing. IMPRESSION: 1. 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 2. Moderate central canal stenosis at L2-3 from broad-based disc protrusion and prominent epidural fat. 3. Progressive moderate foraminal stenosis bilaterally at L4-5, right greater than left. 4. Severe left and moderate right foraminal narrowing at L2-3 with some progression since prior study. 5. Progression of edematous changes at L4-5 and L5-S1. 6. Progression of broad-based disc protrusion and moderate facet hypertrophy bilaterally at L4-5. Electronically signed by: Lonni Necessary MD 09/19/2024 06:28 PM EST RP Workstation: [id number]   DG Chest Port 1 View Result Date: 09/19/2024 EXAM: 1 VIEW(S) XRAY OF THE CHEST 09/19/2024 11:45:00 AM COMPARISON: 01/20/2024 CLINICAL HISTORY: shortness of breath FINDINGS: LUNGS AND PLEURA: Probable left retrocardiac opacity concerning for atelectasis or infiltrate. Possible effusion. No pneumothorax. HEART AND MEDIASTINUM: Stable cardiomegaly. No acute abnormality of the mediastinal silhouette. BONES AND SOFT TISSUES: No acute osseous abnormality. IMPRESSION: 1. Left retrocardiac opacity suspicious for atelectasis or infiltrate, with possible effusion. 2. Stable cardiomegaly. Electronically signed by: Lynwood Seip MD 09/19/2024 12:16 PM EST RP Workstation: [id number]   DG Knee Complete 4 Views Right Result Date: 09/19/2024 EXAM: 4 OR MORE VIEW(S) XRAY OF THE RIGHT KNEE 09/19/2024 11:45:00 AM COMPARISON: None available. CLINICAL HISTORY: shortness of breath FINDINGS: BONES AND JOINTS: No acute fracture. No malalignment. Small suprapatellar joint effusion is noted. Minimal narrowing of lateral joint space is noted. SOFT TISSUES: The  soft tissues are unremarkable. IMPRESSION: 1. Small suprapatellar joint effusion. 2. Minimal lateral compartment joint space narrowing. Electronically signed by: Lynwood Seip MD 09/19/2024 12:14 PM EST RP Workstation: [id number]   DG Pelvis 1-2 Views Result Date: 09/19/2024 EXAM: 1 or 2 view(s) Xray of the pelvis 09/19/2024 11:45:00 AM COMPARISON: None available. CLINICAL HISTORY: Shortness of breath FINDINGS: BONES AND JOINTS: No acute fracture. No malalignment. SOFT TISSUES: The soft tissues are unremarkable. IMPRESSION: 1. No significant abnormality. Electronically signed by: Lynwood Seip MD 09/19/2024 12:13 PM EST RP Workstation: [id number]   Intravitreal Injection, Pharmacologic Agent - OD - Right Eye Result Date: 09/04/2024 Time Out 09/04/2024. 2:37 PM. Confirmed correct patient, procedure, site, and patient consented. Anesthesia Topical anesthesia was used. Anesthetic medications included Lidocaine  2%, Proparacaine 0.5%. Procedure Preparation included 5% betadine to ocular surface, eyelid speculum. A supplied needle was used. Injection: 6 mg faricimab -svoa 6 MG/0.05ML (Patient supplied)   Route: Intravitreal, Site: Right Eye   NDC: 49757-903-93, Lot: [id number], Expiration date: 07/14/2025, Waste: 0 mL Post-op Post injection exam found visual acuity of at least counting fingers. The patient tolerated the procedure well. There were no complications. The patient received written and verbal post procedure care education. Post injection medications were not given. Notes **PAP MEDICATION ADMINISTERED**   Intravitreal Injection, Pharmacologic Agent - OS - Left Eye Result Date: 09/04/2024 Time Out 09/04/2024. 2:37 PM. Confirmed correct patient, procedure, site, and patient consented. Anesthesia Topical anesthesia was used. Anesthetic medications included Lidocaine  2%, Proparacaine 0.5%. Procedure Preparation included 5% betadine to ocular surface, eyelid speculum. A supplied (32g) needle was used. Injection: 6  mg faricimab -svoa 6 MG/0.05ML (Patient supplied)   Route: Intravitreal, Site: Left Eye   NDC: 49757-903-93, Lot: [id number], Expiration date: 07/14/2025, Waste: 0 mL Post-op Post injection exam found visual acuity of at least counting fingers. The patient tolerated the procedure well. There were no complications. The patient received written and verbal post procedure care education.  Post injection medications were not given. Notes **PAP MEDICATION ADMINISTERED**  OCT, Retina - OU - Both Eyes Result Date: 09/04/2024 Right Eye Quality was good. Central Foveal Thickness: 269. Progression has improved. Findings include normal foveal contour, no SRF, intraretinal fluid, vitreomacular adhesion (Interval resolution of trace cystic changes ST fovea, partial PVD). Left Eye Quality was good. Central Foveal Thickness: 269. Progression has been stable. Findings include normal foveal contour, no IRF, no SRF, intraretinal hyper-reflective material (persistent cystic changes and punctate IRHM, partial PVD). Notes *Images captured and stored on drive Diagnosis / Impression: +DME OU OD: Interval resolution of trace cystic changes ST fovea,  partial PVD OS: persistent cystic changes and punctate IRHM, partial PVD Clinical management: See below Abbreviations: NFP - Normal foveal profile. CME - cystoid macular edema. PED - pigment epithelial detachment. IRF - intraretinal fluid. SRF - subretinal fluid. EZ - ellipsoid zone. ERM - epiretinal membrane. ORA - outer retinal atrophy. ORT - outer retinal tubulation. SRHM - subretinal hyper-reflective material. IRHM - intraretinal hyper-reflective material   Alm Schneider, DO  [facility]  If 7PM-7AM, please contact night-coverage [email or web address] Password [email or web address] 09/25/2024, 5:20 PM   LOS: 6 days

## 2024-09-26 DIAGNOSIS — I5042 Chronic combined systolic (congestive) and diastolic (congestive) heart failure: Secondary | ICD-10-CM | POA: Diagnosis not present

## 2024-09-26 DIAGNOSIS — Z95818 Presence of other cardiac implants and grafts: Secondary | ICD-10-CM | POA: Diagnosis not present

## 2024-09-26 LAB — GLUCOSE, CAPILLARY
Glucose-Capillary: 118 mg/dL — ABNORMAL HIGH (ref 70–99)
Glucose-Capillary: 101 mg/dL — ABNORMAL HIGH (ref 70–99)
Glucose-Capillary: 143 mg/dL — ABNORMAL HIGH (ref 70–99)
Glucose-Capillary: 145 mg/dL — ABNORMAL HIGH (ref 70–99)
Glucose-Capillary: 253 mg/dL — ABNORMAL HIGH (ref 70–99)

## 2024-09-26 LAB — CULTURE, BLOOD (ROUTINE X 2)
Culture  Setup Time: NO GROWTH
Special Requests: ADEQUATE

## 2024-09-26 LAB — BASIC METABOLIC PANEL WITH GFR
Anion gap: 5 (ref 5–15)
BUN: 17 mg/dL (ref 8–23)
CO2: 37 mmol/L — ABNORMAL HIGH (ref 22–32)
Calcium: 8.9 mg/dL (ref 8.9–10.3)
Chloride: 97 mmol/L — ABNORMAL LOW (ref 98–111)
Creatinine, Ser: 1.13 mg/dL (ref 0.61–1.24)
GFR, Estimated: >60 mL/min (ref >60)
Glucose, Bld: 116 mg/dL — ABNORMAL HIGH (ref 70–99)
Potassium: 3.6 mmol/L (ref 3.5–5.1)
Sodium: 140 mmol/L (ref 135–145)

## 2024-09-26 LAB — MAGNESIUM: Magnesium: 2 mg/dL (ref 1.7–2.4)

## 2024-09-26 MED ORDER — POTASSIUM CHLORIDE CRYS ER 20 MEQ PO TBCR
40.0000 meq | EXTENDED_RELEASE_TABLET | Freq: Two times a day (BID) | ORAL | Status: DC
  Administered 2024-09-26 – 2024-09-29 (×6): 40 meq via ORAL
  Filled 2024-09-26 (×6): qty 2

## 2024-09-26 MED ADMIN — Diltiazem HCl Tab 60 MG: 60 mg | ORAL | NDC 00093031901

## 2024-09-26 MED ADMIN — Diltiazem HCl Tab 60 MG: 60 mg | ORAL | NDC 60687072811

## 2024-09-26 NOTE — Progress Notes (Signed)
 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  He apparently had a fall about 4 days ago at home.  He did not seek medical treatment at the time.  He fell again 2 days ago and was not able to get up and had been on the floor for about 1-2 days.  He has been having difficulty for past few months with lower extremity weakness and his legs giving out.  They have not been able to determine the cause of this.  His daughter called EMS to check on him because she had not heard from him in a couple of days.  EMS found him to have right knee swelling.  He complained of shortness of breath and knee pain.  He has not had his medications in a couple of days.  The ED found him to be tachycardic in A-fib with RVR and was hypoxic and placed on 3 L nasal cannula.  His chest x-ray was suspicious for atelectasis and/or pneumonia.  He was started on IV diltiazem  infusion, IV antibiotics, Lasix , IV fluids.  He was noted to be tachypneic and had normal venous pH.  His blood sugar was 239.  His CK was 518.  Creatinine 1.44, GFR 48.5 mL/min and troponin 91.  White blood cell count 17.4, hemoglobin 11.9, platelets 271.  Bilirubin 2.2.  His trauma workup revealed normal pelvis imaging, right knee imaging reveals small suprapatellar joint effusion.  Admission requested for further management.   Assessment/Plan: Paroxysmal Atrial fibrillation with RVR -- he was started on IV diltiazem  infusion in ED -- admitted to stepdown ICU --- transfer to telemetry bed  -- weaned off  diltiazem  infusion to oral Cardizem  30 mg every 6 hours  -- he is s/p watchman device as he could not tolerate full anticoagulation -- he previously had controlled rate Afib without AV nodal agent per cardiology notes PTA - continue short acting diltiazem     Acute on Chronic HFpEF -- ReDS vest 40% on 12/9 -- lasix  increased to 80 mg IV bid -- cardiology team started him on IV furosemide  injection  - down to 228>>>222 lbs - I/O incomplete, but NEG 7.7L  Bacteremia -Cutibacterium acnes 1/2 sets -likely contaminant -obtain echo as pt has Watchman   Lobar  pneumonia Sepsis ruled out  -- 12/7 CXR with LLL opacity -- IV antibiotics--finished 5 days doxy/ceftriaxone    Generalized weakness Adult failure to thrive  Lumbar radiculopathy with leg weakness -- discussed with daughter symptoms of leg weakness progressing and requested MRI L spine -- if patient can tolerate will try to obtain MRI L spine without contrast  -- MRI with findings of 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  Will obtain PT evaluation.  MRI brain ordered to rule out acute CVA -- MRI brain negative for CVA -- Pt recommending SNF and patient is agreeable.   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 -- continue semglee  30 units and novolog  2 units with meals -- SSI coverage and frequent CBG monitoring ordered -- 09/19/24 A1c 11.1%    Acute urinary retention -- I/O caths done in ED -- temporary foley  cath placement ordered -- removed foley 12/8 and he has been voiding    Essential hypertension  -- weaned off IV diltiazem  infusion to oral diltiazem  30 mg every 6 hours     Hyperlipidemia -- he is reportedly intolerent to statins  -- cardiology had started him on zetia  daily previously but he now says he doesn't take it    Stage 3a CKD -- baseline creatinine 1.1-1.4 - monitor with diuresis   OSA on CPAP -- nightly CPAP ordered   Iron  deficiency anemia -- he is followed by hematology  -- he is no  longer on octreotide  injections -- he is receiving IV iron  infusions.  He received IV iron  12/8 in hospital   CAD -- recent cath demonstrated 40% proximal to mid RCA stenosis and 60% PDA stenosis managed with medical therapy by cardiology.  -- he is on aspirin  81 mg daily  - no chest pain   Obesity -BMI 32.49 -lifestyle modification   Hypokalemia -replete -mag - 2.0                 Family Communication:   daughter at bedside 12/10   Consultants:  cardiology   Code Status:  FULL    DVT Prophylaxis:   [place] Lovenox      Procedures: As Listed in Progress Note Above   Antibiotics: Ceftriaxone  12/6>>12/10 Doxy 12/6>12/10  Subjective: He is breathing better.  Still has nonproductive cough.  Denies cp, n/v/d, abd pain  Objective: Vitals:   09/25/24 2307 09/26/24 0500 09/26/24 0902 09/26/24 1359  BP:  134/60 112/75 (!) 102/56  Pulse: 76 88  66  Resp: 17 18    Temp:  98.4 F (36.9 C)  98.2 F (36.8 C)  TempSrc:  Oral  Oral  SpO2: 92% 96%  95%  Weight:  92.3 kg    Height:        Intake/Output Summary (Last 24 hours) at 09/26/2024 1734 Last data filed at 09/26/2024 1330 Gross per 24 hour  Intake 840 ml  Output 3750 ml  Net -2910 ml   Weight change: -5.487 kg Exam:  General:  Pt is alert, follows commands appropriately, not in acute distress HEENT: No icterus, No thrush, No neck mass, [place]/AT Cardiovascular: RRR, S1/S2, no rubs, no gallops Respiratory: bibasilar crackles. No wheeze Abdomen: Soft/+BS, non tender, non distended, no guarding Extremities: 1+ LE edema, No lymphangitis, No petechiae, No rashes, no synovitis   Data Reviewed: I have personally reviewed following labs and imaging studies Basic Metabolic Panel: Recent Labs  Lab 09/20/24 0503 09/21/24 0335 09/22/24 0448 09/23/24 0518 09/24/24 0624 09/25/24 0501 09/26/24 0502  NA 138   < > 140 141 140 141 140  K 2.7*   < > 3.1* 3.8 3.4* 3.6 3.6  CL 95*   < > 98 100 98 97* 97*  CO2 28   < >  35* 37* 39* 38* 37*  GLUCOSE 137*   < > 109* 153* 145* 122* 116*  BUN 38*   < > 29* 24* 21 18 17   CREATININE 1.25*   < > 1.18 1.25* 1.21 1.07 1.13  CALCIUM  8.3*   < > 8.3* 8.6* 8.5* 8.6* 8.9  MG 2.4  --  2.2  --  2.4 2.0 2.0   < > = values in this interval not displayed.   Liver Function Tests: Recent Labs  Lab 09/20/24 0503 09/21/24 0335  AST 28 27  ALT 13 13  ALKPHOS 83 98  BILITOT 1.0 0.8  PROT  5.9* 6.0*  ALBUMIN 3.0* 3.0*   No results for input(s): LIPASE, AMYLASE in the last 168 hours. No results for input(s): AMMONIA in the last 168 hours. Coagulation Profile: No results for input(s): INR, PROTIME in the last 168 hours. CBC: Recent Labs  Lab 09/20/24 0503 09/21/24 0335 09/22/24 0448 09/23/24 0518  WBC 12.4* 13.2* 10.2 11.2*  NEUTROABS 10.3* 11.0* 7.9* 8.6*  HGB 10.4* 10.5* 11.4* 11.1*  HCT 35.1* 35.3* 39.8 39.5  MCV 81.3 81.0 82.7 84.2  PLT 229 234 308 363   Cardiac Enzymes: Recent Labs  Lab 09/20/24 0503  CKTOTAL 281   BNP: Invalid input(s): POCBNP CBG: Recent Labs  Lab 09/25/24 2047 09/26/24 0433 09/26/24 0751 09/26/24 1129 09/26/24 1634  GLUCAP 129* 118* 101* 143* 145*   HbA1C: No results for input(s): HGBA1C in the last 72 hours. Urine analysis:    Component Value Date/Time   COLORURINE YELLOW 09/19/2024 1124   APPEARANCEUR CLEAR 09/19/2024 1124   APPEARANCEUR Clear 04/09/2024 1623   LABSPEC 1.021 09/19/2024 1124   PHURINE 5.0 09/19/2024 1124   GLUCOSEU >=500 (A) 09/19/2024 1124   HGBUR MODERATE (A) 09/19/2024 1124   BILIRUBINUR NEGATIVE 09/19/2024 1124   BILIRUBINUR Negative 04/09/2024 1623   KETONESUR 5 (A) 09/19/2024 1124   PROTEINUR 30 (A) 09/19/2024 1124   NITRITE NEGATIVE 09/19/2024 1124   LEUKOCYTESUR NEGATIVE 09/19/2024 1124   Sepsis Labs: @LABRCNTIP (procalcitonin:4,lacticidven:4) ) Recent Results (from the past 240 hours)  Blood culture (routine x 2)     Status: Abnormal   Collection Time: 09/19/24 12:51  PM   Specimen: BLOOD RIGHT ARM  Result Value Ref Range Status   Specimen Description   Final    BLOOD RIGHT ARM Performed at [facility], [address] [place], [place] [address]    Special Requests   Final    Blood Culture adequate volume Performed at [facility], [address] [place], [place] [address]    Culture  Setup Time   Final    ANAEROBIC BOTTLE ONLY GRAM POSITIVE RODS Gram Stain Report Called to,Read Back By and Verified With: JAYSON ISLAND AT 1556 ON 12.11.25 BY ADGER J  Performed at [facility], [address] [place], [place] [address]    Culture (A)  Final    CUTIBACTERIUM ACNES Standardized susceptibility testing for this organism is not available. Performed at [facility], [address] [place], [place] [address]    Report Status 09/26/2024 FINAL  Final  Blood culture (routine x 2)     Status: None   Collection Time: 09/19/24 12:56 PM   Specimen: Left Antecubital; Blood  Result Value Ref Range Status   Specimen Description LEFT ANTECUBITAL  Final   Special Requests Blood Culture adequate volume  Final   Culture   Final    NO GROWTH 5 DAYS Performed at [facility], [address] [place], [place] [address]    Report Status 09/24/2024 FINAL  Final  MRSA Next Gen by PCR, Nasal     Status: None   Collection Time: 09/19/24  2:05 PM   Specimen: Nasal Mucosa; Nasal Swab  Result Value Ref Range Status   MRSA by PCR Next Gen NOT DETECTED NOT DETECTED Final    Comment: (NOTE) The GeneXpert MRSA Assay (FDA approved for NASAL specimens only), is one component of a comprehensive MRSA colonization surveillance program. It is not intended to diagnose MRSA infection nor to guide or monitor treatment for MRSA infections. Test performance is not FDA approved in  patients less than 32 years old. Performed at [facility], [address] [place], [place] [address]      Scheduled Meds:  acetaminophen   650 mg Oral Q6H   Or   acetaminophen   650 mg Rectal Q6H    aspirin   81 mg Oral Daily   brimonidine   1 drop Both Eyes BID   And   timolol   1 drop Both Eyes BID   Chlorhexidine  Gluconate Cloth  6 each Topical Daily   dapagliflozin  propanediol  10 mg Oral Daily   diltiazem   60 mg Oral BID   dorzolamide   1 drop Both Eyes BID   enoxaparin  (LOVENOX ) injection  40 mg Subcutaneous Q24H   furosemide   80 mg Intravenous BID   gabapentin   100 mg Oral QHS   insulin  aspart  0-9 Units Subcutaneous TID WC   insulin  aspart  2 Units Subcutaneous TID WC   insulin  glargine  30 Units Subcutaneous QHS   liver oil-zinc  oxide   Topical BID   pantoprazole   40 mg Oral QPM   potassium chloride   40 mEq Oral BID   Continuous Infusions:  Procedures/Studies: MR BRAIN WO CONTRAST Result Date: 09/20/2024 EXAM: MRI BRAIN WITHOUT CONTRAST 09/20/2024 02:20:10 PM TECHNIQUE: Multiplanar multisequence MRI of the head/brain was performed without the administration of intravenous contrast. COMPARISON: CT head 10/18/2018 CLINICAL HISTORY: Neuro deficit, acute, stroke suspected. Generalized weakness, rule out acute Cerebrovascular Accident. FINDINGS: BRAIN AND VENTRICLES: No acute infarct. No intracranial hemorrhage. No mass. No midline shift. No hydrocephalus. Mild for age chronic microvascular ischemic change. A few small remote left frontal cortical infarcts. Small remote right cerebellar infarct. Normal flow voids. ORBITS: No acute abnormality. SINUSES AND MASTOIDS: No acute abnormality. BONES AND SOFT TISSUES: Normal marrow signal. No acute soft tissue abnormality. IMPRESSION: 1. No acute intracranial abnormality. Electronically signed by: Gilmore Molt MD 09/20/2024 03:15 PM EST RP Workstation: [id number]   DG CHEST PORT 1 VIEW Result Date: 09/20/2024 EXAM: 1 VIEW(S) XRAY OF THE CHEST 09/20/2024 05:29:00 AM COMPARISON: 09/19/2024 CLINICAL HISTORY: 08790 Atelectasis 91209; 10031 Cough 10031 FINDINGS: LUNGS AND PLEURA: Low lung volumes. Possible left retrocardiac opacity, favored to  reflect atelectasis, but difficult to exclude superimposed airspace opacity. Decreased left pleural effusion. Trace right pleural effusion. No pneumothorax. HEART AND MEDIASTINUM: Cardiomegaly. Atherosclerotic calcifications. BONES AND SOFT TISSUES: No acute osseous abnormality. IMPRESSION: 1. Decreased left pleural effusion and trace right pleural effusion. 2. Possible left retrocardiac opacity, favored to reflect atelectasis, but difficult to exclude superimposed airspace opacity. 3. Cardiomegaly and atherosclerotic calcifications. Electronically signed by: Evalene Coho MD 09/20/2024 06:05 AM EST RP Workstation: [id number]   MR LUMBAR SPINE WO CONTRAST Result Date: 09/19/2024 EXAM: MRI LUMBAR SPINE 09/19/2024 04:34:40 PM TECHNIQUE: Multiplanar multisequence MRI of the lumbar spine was performed without the administration of intravenous contrast. COMPARISON: 04/08/2020 CLINICAL HISTORY: Lumbar radiculopathy, symptoms persist with > 6 wks treatment; Myelopathy, acute, lumbar spine. FINDINGS: BONES AND ALIGNMENT: Normal alignment except for slight retrolisthesis at L2-L3 and L3-L4, which is stable. Normal vertebral body heights. Bone marrow signal is unremarkable except for extensive type 2 Modic changes at L2-L3 and L3-L4, and edematous changes that have progressed at L4-L5 and L5-S1. SPINAL CORD: The conus medullaris terminates at L1. SOFT TISSUES: No paraspinal mass. L1-L2: No significant disc herniation. No spinal canal stenosis or neural foraminal narrowing. L2-L3: Uncovertebral broad-based disc protrusion results in stable mild foraminal narrowing bilaterally. A broad-based disc protrusion and prominent epidural fat results in moderate central stenosis. Severe left and moderate right foraminal  narrowing demonstrate some progression since the prior study. L3-L4: Broad-based disc protrusion has progressed. Moderate facet hypertrophy has progressed bilaterally. 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 Progressive moderate foraminal stenosis is present bilaterally, right greater than left. L4-L5: Chronic positive disc height is present. Endplate ridging and epidural lipomatosis result in moderate central canal stenosis. Moderate-to-severe right and moderate left foraminal stenosis is stable. L5-S1: Edematous changes have progressed. No significant disc herniation. No spinal canal stenosis or neural foraminal narrowing. IMPRESSION: 1. 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 2. Moderate central canal stenosis at L2-3 from broad-based disc protrusion and prominent epidural fat. 3. Progressive moderate foraminal stenosis bilaterally at L4-5, right greater than left. 4. Severe left and moderate right foraminal narrowing at L2-3 with some progression since prior study. 5. Progression of edematous changes at L4-5 and L5-S1. 6. Progression of broad-based disc protrusion and moderate facet hypertrophy bilaterally at L4-5. Electronically signed by: Lonni Necessary MD 09/19/2024 06:28 PM EST RP Workstation: [id number]   DG Chest Port 1 View Result Date: 09/19/2024 EXAM: 1 VIEW(S) XRAY OF THE CHEST 09/19/2024 11:45:00 AM COMPARISON: 01/20/2024 CLINICAL HISTORY: shortness of breath FINDINGS: LUNGS AND PLEURA: Probable left retrocardiac opacity concerning for atelectasis or infiltrate. Possible effusion. No pneumothorax. HEART AND MEDIASTINUM: Stable cardiomegaly. No acute abnormality of the mediastinal silhouette. BONES AND SOFT TISSUES: No acute osseous abnormality. IMPRESSION: 1. Left retrocardiac opacity suspicious for atelectasis or infiltrate, with possible effusion. 2. Stable cardiomegaly. Electronically signed by: Lynwood Seip MD 09/19/2024 12:16 PM EST RP Workstation: [id number]   DG Knee Complete 4 Views Right Result Date: 09/19/2024 EXAM: 4 OR MORE VIEW(S) XRAY OF THE RIGHT KNEE  09/19/2024 11:45:00 AM COMPARISON: None available. CLINICAL HISTORY: shortness of breath FINDINGS: BONES AND JOINTS: No acute fracture. No malalignment. Small suprapatellar joint effusion is noted. Minimal narrowing of lateral joint space is noted. SOFT TISSUES: The soft tissues are unremarkable. IMPRESSION: 1. Small suprapatellar joint effusion. 2. Minimal lateral compartment joint space narrowing. Electronically signed by: Lynwood Seip MD 09/19/2024 12:14 PM EST RP Workstation: [id number]   DG Pelvis 1-2 Views Result Date: 09/19/2024 EXAM: 1 or 2 view(s) Xray of the pelvis 09/19/2024 11:45:00 AM COMPARISON: None available. CLINICAL HISTORY: Shortness of breath FINDINGS: BONES AND JOINTS: No acute fracture. No malalignment. SOFT TISSUES: The soft tissues are unremarkable. IMPRESSION: 1. No significant abnormality. Electronically signed by: Lynwood Seip MD 09/19/2024 12:13 PM EST RP Workstation: [id number]   Intravitreal Injection, Pharmacologic Agent - OD - Right Eye Result Date: 09/04/2024 Time Out 09/04/2024. 2:37 PM. Confirmed correct patient, procedure, site, and patient consented. Anesthesia Topical anesthesia was used. Anesthetic medications included Lidocaine  2%, Proparacaine 0.5%. Procedure Preparation included 5% betadine to ocular surface, eyelid speculum. A supplied needle was used. Injection: 6 mg faricimab -svoa 6 MG/0.05ML (Patient supplied)   Route: Intravitreal, Site: Right Eye   NDC: 49757-903-93, Lot: [id number], Expiration date: 07/14/2025, Waste: 0 mL Post-op Post injection exam found visual acuity of at least counting fingers. The patient tolerated the procedure well. There were no complications. The patient received written and verbal post procedure care education. Post injection medications were not given. Notes **PAP MEDICATION ADMINISTERED**   Intravitreal Injection, Pharmacologic Agent - OS - Left Eye Result Date: 09/04/2024 Time Out 09/04/2024. 2:37 PM. Confirmed correct patient,  procedure, site, and patient consented. Anesthesia Topical anesthesia was used. Anesthetic medications included Lidocaine  2%, Proparacaine 0.5%. Procedure Preparation included 5% betadine to ocular surface,  eyelid speculum. A supplied (32g) needle was used. Injection: 6 mg faricimab -svoa 6 MG/0.05ML (Patient supplied)   Route: Intravitreal, Site: Left Eye   NDC: 49757-903-93, Lot: [id number], Expiration date: 07/14/2025, Waste: 0 mL Post-op Post injection exam found visual acuity of at least counting fingers. The patient tolerated the procedure well. There were no complications. The patient received written and verbal post procedure care education. Post injection medications were not given. Notes **PAP MEDICATION ADMINISTERED**  OCT, Retina - OU - Both Eyes Result Date: 09/04/2024 Right Eye Quality was good. Central Foveal Thickness: 269. Progression has improved. Findings include normal foveal contour, no SRF, intraretinal fluid, vitreomacular adhesion (Interval resolution of trace cystic changes ST fovea, partial PVD). Left Eye Quality was good. Central Foveal Thickness: 269. Progression has been stable. Findings include normal foveal contour, no IRF, no SRF, intraretinal hyper-reflective material (persistent cystic changes and punctate IRHM, partial PVD). Notes *Images captured and stored on drive Diagnosis / Impression: +DME OU OD: Interval resolution of trace cystic changes ST fovea,  partial PVD OS: persistent cystic changes and punctate IRHM, partial PVD Clinical management: See below Abbreviations: NFP - Normal foveal profile. CME - cystoid macular edema. PED - pigment epithelial detachment. IRF - intraretinal fluid. SRF - subretinal fluid. EZ - ellipsoid zone. ERM - epiretinal membrane. ORA - outer retinal atrophy. ORT - outer retinal tubulation. SRHM - subretinal hyper-reflective material. IRHM - intraretinal hyper-reflective material   Alm Schneider, DO  [facility]  If 7PM-7AM, please contact  night-coverage [email or web address] Password [email or web address] 09/26/2024, 5:34 PM   LOS: 7 days

## 2024-09-26 NOTE — Plan of Care (Signed)
 Problem: Education: Goal: Knowledge of General Education information will improve Description: Including pain rating scale, medication(s)/side effects and non-pharmacologic comfort measures Outcome: Progressing   Problem: Health Behavior/Discharge Planning: Goal: Ability to manage health-related needs will improve Outcome: Progressing   Problem: Clinical Measurements: Goal: Ability to maintain clinical measurements within normal limits will improve Outcome: Progressing Goal: Will remain free from infection Outcome: Progressing Goal: Diagnostic test results will improve Outcome: Progressing Goal: Respiratory complications will improve Outcome: Progressing Goal: Cardiovascular complication will be avoided Outcome: Progressing   Problem: Activity: Goal: Risk for activity intolerance will decrease Outcome: Progressing   Problem: Nutrition: Goal: Adequate nutrition will be maintained Outcome: Progressing   Problem: Coping: Goal: Level of anxiety will decrease Outcome: Progressing   Problem: Elimination: Goal: Will not experience complications related to bowel motility Outcome: Progressing Goal: Will not experience complications related to urinary retention Outcome: Progressing   Problem: Pain Managment: Goal: General experience of comfort will improve and/or be controlled Outcome: Progressing   Problem: Safety: Goal: Ability to remain free from injury will improve Outcome: Progressing   Problem: Skin Integrity: Goal: Risk for impaired skin integrity will decrease Outcome: Progressing   Problem: Education: Goal: Ability to describe self-care measures that may prevent or decrease complications (Diabetes Survival Skills Education) will improve Outcome: Progressing Goal: Individualized Educational Video(s) Outcome: Progressing   Problem: Coping: Goal: Ability to adjust to condition or change in health will improve Outcome: Progressing   Problem: Fluid  Volume: Goal: Ability to maintain a balanced intake and output will improve Outcome: Progressing   Problem: Health Behavior/Discharge Planning: Goal: Ability to identify and utilize available resources and services will improve Outcome: Progressing Goal: Ability to manage health-related needs will improve Outcome: Progressing   Problem: Metabolic: Goal: Ability to maintain appropriate glucose levels will improve Outcome: Progressing   Problem: Nutritional: Goal: Maintenance of adequate nutrition will improve Outcome: Progressing Goal: Progress toward achieving an optimal weight will improve Outcome: Progressing   Problem: Skin Integrity: Goal: Risk for impaired skin integrity will decrease Outcome: Progressing   Problem: Tissue Perfusion: Goal: Adequacy of tissue perfusion will improve Outcome: Progressing   Problem: Education: Goal: Ability to demonstrate management of disease process will improve Outcome: Progressing Goal: Ability to verbalize understanding of medication therapies will improve Outcome: Progressing Goal: Individualized Educational Video(s) Outcome: Progressing   Problem: Activity: Goal: Capacity to carry out activities will improve Outcome: Progressing   Problem: Cardiac: Goal: Ability to achieve and maintain adequate cardiopulmonary perfusion will improve Outcome: Progressing   Problem: Education: Goal: Knowledge of disease or condition will improve Outcome: Progressing Goal: Understanding of medication regimen will improve Outcome: Progressing Goal: Individualized Educational Video(s) Outcome: Progressing   Problem: Activity: Goal: Ability to tolerate increased activity will improve Outcome: Progressing   Problem: Cardiac: Goal: Ability to achieve and maintain adequate cardiopulmonary perfusion will improve Outcome: Progressing   Problem: Health Behavior/Discharge Planning: Goal: Ability to safely manage health-related needs after  discharge will improve Outcome: Progressing   Problem: Education: Goal: Knowledge of disease and its progression will improve Outcome: Progressing Goal: Individualized Educational Video(s) Outcome: Progressing   Problem: Fluid Volume: Goal: Compliance with measures to maintain balanced fluid volume will improve Outcome: Progressing   Problem: Health Behavior/Discharge Planning: Goal: Ability to manage health-related needs will improve Outcome: Progressing   Problem: Nutritional: Goal: Ability to make healthy dietary choices will improve Outcome: Progressing   Problem: Clinical Measurements: Goal: Complications related to the disease process, condition or treatment will be avoided or minimized Outcome: Progressing

## 2024-09-27 ENCOUNTER — Inpatient Hospital Stay (HOSPITAL_COMMUNITY)

## 2024-09-27 DIAGNOSIS — I5033 Acute on chronic diastolic (congestive) heart failure: Secondary | ICD-10-CM

## 2024-09-27 DIAGNOSIS — A419 Sepsis, unspecified organism: Secondary | ICD-10-CM | POA: Diagnosis not present

## 2024-09-27 LAB — ECHOCARDIOGRAM COMPLETE
Calc EF: 44.8 %
Height: 70 in
MV M vel: 4.35 m/s
MV Peak grad: 75.7 mmHg
S' Lateral: 4.65 cm
Single Plane A2C EF: 44.9 %
Single Plane A4C EF: 43.2 %
Weight: 3287.5 [oz_av]

## 2024-09-27 LAB — MAGNESIUM: Magnesium: 2.1 mg/dL (ref 1.7–2.4)

## 2024-09-27 LAB — GLUCOSE, CAPILLARY
Glucose-Capillary: 159 mg/dL — ABNORMAL HIGH (ref 70–99)
Glucose-Capillary: 121 mg/dL — ABNORMAL HIGH (ref 70–99)
Glucose-Capillary: 187 mg/dL — ABNORMAL HIGH (ref 70–99)
Glucose-Capillary: 185 mg/dL — ABNORMAL HIGH (ref 70–99)
Glucose-Capillary: 257 mg/dL — ABNORMAL HIGH (ref 70–99)

## 2024-09-27 LAB — BASIC METABOLIC PANEL WITH GFR
Anion gap: 5 (ref 5–15)
BUN: 18 mg/dL (ref 8–23)
CO2: 36 mmol/L — ABNORMAL HIGH (ref 22–32)
Calcium: 8.5 mg/dL — ABNORMAL LOW (ref 8.9–10.3)
Chloride: 99 mmol/L (ref 98–111)
Creatinine, Ser: 1.13 mg/dL (ref 0.61–1.24)
GFR, Estimated: >60 mL/min (ref >60)
Glucose, Bld: 142 mg/dL — ABNORMAL HIGH (ref 70–99)
Potassium: 3.6 mmol/L (ref 3.5–5.1)
Sodium: 140 mmol/L (ref 135–145)

## 2024-09-27 MED ORDER — COLCHICINE 0.6 MG PO TABS
0.6000 mg | ORAL_TABLET | Freq: Every day | ORAL | Status: DC
  Administered 2024-09-27 – 2024-09-29 (×3): 0.6 mg via ORAL
  Filled 2024-09-27 (×3): qty 1

## 2024-09-27 MED ADMIN — Diltiazem HCl Tab 60 MG: 60 mg | ORAL | NDC 50228048201

## 2024-09-27 MED ADMIN — Diltiazem HCl Tab 60 MG: 60 mg | ORAL | NDC 60687072811

## 2024-09-27 NOTE — Progress Notes (Signed)
°  Echocardiogram 2D Echocardiogram has been performed.  LAMON MAXWELL 09/27/2024, 8:44 AM

## 2024-09-27 NOTE — Plan of Care (Signed)
 Problem: Education: Goal: Knowledge of General Education information will improve Description: Including pain rating scale, medication(s)/side effects and non-pharmacologic comfort measures Outcome: Progressing   Problem: Health Behavior/Discharge Planning: Goal: Ability to manage health-related needs will improve Outcome: Progressing   Problem: Clinical Measurements: Goal: Ability to maintain clinical measurements within normal limits will improve Outcome: Progressing Goal: Will remain free from infection Outcome: Progressing Goal: Diagnostic test results will improve Outcome: Progressing Goal: Respiratory complications will improve Outcome: Progressing Goal: Cardiovascular complication will be avoided Outcome: Progressing   Problem: Activity: Goal: Risk for activity intolerance will decrease Outcome: Progressing   Problem: Nutrition: Goal: Adequate nutrition will be maintained Outcome: Progressing   Problem: Coping: Goal: Level of anxiety will decrease Outcome: Progressing   Problem: Elimination: Goal: Will not experience complications related to bowel motility Outcome: Progressing Goal: Will not experience complications related to urinary retention Outcome: Progressing   Problem: Pain Managment: Goal: General experience of comfort will improve and/or be controlled Outcome: Progressing   Problem: Safety: Goal: Ability to remain free from injury will improve Outcome: Progressing   Problem: Skin Integrity: Goal: Risk for impaired skin integrity will decrease Outcome: Progressing   Problem: Education: Goal: Ability to describe self-care measures that may prevent or decrease complications (Diabetes Survival Skills Education) will improve Outcome: Progressing Goal: Individualized Educational Video(s) Outcome: Progressing   Problem: Coping: Goal: Ability to adjust to condition or change in health will improve Outcome: Progressing   Problem: Fluid  Volume: Goal: Ability to maintain a balanced intake and output will improve Outcome: Progressing   Problem: Health Behavior/Discharge Planning: Goal: Ability to identify and utilize available resources and services will improve Outcome: Progressing Goal: Ability to manage health-related needs will improve Outcome: Progressing   Problem: Metabolic: Goal: Ability to maintain appropriate glucose levels will improve Outcome: Progressing   Problem: Nutritional: Goal: Maintenance of adequate nutrition will improve Outcome: Progressing Goal: Progress toward achieving an optimal weight will improve Outcome: Progressing   Problem: Skin Integrity: Goal: Risk for impaired skin integrity will decrease Outcome: Progressing   Problem: Tissue Perfusion: Goal: Adequacy of tissue perfusion will improve Outcome: Progressing   Problem: Education: Goal: Ability to demonstrate management of disease process will improve Outcome: Progressing Goal: Ability to verbalize understanding of medication therapies will improve Outcome: Progressing Goal: Individualized Educational Video(s) Outcome: Progressing   Problem: Activity: Goal: Capacity to carry out activities will improve Outcome: Progressing   Problem: Cardiac: Goal: Ability to achieve and maintain adequate cardiopulmonary perfusion will improve Outcome: Progressing   Problem: Education: Goal: Knowledge of disease or condition will improve Outcome: Progressing Goal: Understanding of medication regimen will improve Outcome: Progressing Goal: Individualized Educational Video(s) Outcome: Progressing   Problem: Activity: Goal: Ability to tolerate increased activity will improve Outcome: Progressing   Problem: Cardiac: Goal: Ability to achieve and maintain adequate cardiopulmonary perfusion will improve Outcome: Progressing   Problem: Health Behavior/Discharge Planning: Goal: Ability to safely manage health-related needs after  discharge will improve Outcome: Progressing   Problem: Education: Goal: Knowledge of disease and its progression will improve Outcome: Progressing Goal: Individualized Educational Video(s) Outcome: Progressing   Problem: Fluid Volume: Goal: Compliance with measures to maintain balanced fluid volume will improve Outcome: Progressing   Problem: Health Behavior/Discharge Planning: Goal: Ability to manage health-related needs will improve Outcome: Progressing   Problem: Nutritional: Goal: Ability to make healthy dietary choices will improve Outcome: Progressing   Problem: Clinical Measurements: Goal: Complications related to the disease process, condition or treatment will be avoided or minimized Outcome: Progressing

## 2024-09-27 NOTE — Progress Notes (Addendum)
 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  He apparently had a fall about 4 days ago at home.  He did not seek medical treatment at the time.  He fell again 2 days ago and was not able to get up and had been on the floor for about 1-2 days.  He has been having difficulty for past few months with lower extremity weakness and his legs giving out.  They have not been able to determine the cause of this.  His daughter called EMS to check on him because she had not heard from him in a couple of days.  EMS found him to have right knee swelling.  He complained of shortness of breath and knee pain.  He has not had his medications in a couple of days.  The ED found him to be tachycardic in A-fib with RVR and was hypoxic and placed on 3 L nasal cannula.  His chest x-ray was suspicious for atelectasis and/or pneumonia.  He was started on IV diltiazem  infusion, IV antibiotics, Lasix , IV fluids.  He was noted to be tachypneic and had normal venous pH.  His blood sugar was 239.  His CK was 518.  Creatinine 1.44, GFR 48.5 mL/min and troponin 91.  White blood cell count 17.4, hemoglobin 11.9, platelets 271.  Bilirubin 2.2.  His trauma workup revealed normal pelvis imaging, right knee imaging reveals small suprapatellar joint effusion.  Admission requested for further management.   Assessment/Plan: Paroxysmal Atrial fibrillation with RVR -- he was started on IV diltiazem  infusion in ED -- admitted to stepdown ICU --- transfer to telemetry bed  -- weaned off  diltiazem  infusion to oral Cardizem  30 mg every 6 hours  -- he is s/p watchman device as he could not tolerate full anticoagulation -- he previously had controlled rate Afib without AV nodal agent per cardiology notes PTA - continue short acting diltiazem     Acute on Chronic HFpEF -- ReDS vest 40% on 12/9 -- lasix  increased to 80 mg IV bid -- cardiology team started him on IV furosemide  injection  - down to 228>>>222 lbs - I/O incomplete, but NEG 10.6L   Bacteremia -Cutibacterium acnes 1/2 sets -likely contaminant -obtain echo as pt has Watchman   Lobar  pneumonia Sepsis ruled out  -- 12/7 CXR with LLL opacity -- IV antibiotics--finished 5 days doxy/ceftriaxone    Generalized weakness Adult failure to thrive  Lumbar radiculopathy with leg weakness -- discussed with daughter symptoms of leg weakness progressing and requested MRI L spine -- if patient can tolerate will try to obtain MRI L spine without contrast  -- MRI with findings of 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  Will obtain PT evaluation.  MRI brain ordered to rule out acute CVA -- MRI brain negative for CVA -- Pt recommending SNF and patient is agreeable.   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 -- continue semglee  30 units and novolog  2 units with meals -- SSI coverage and frequent CBG monitoring ordered -- 09/19/24 A1c 11.1%    Acute urinary retention -- I/O caths done in ED -- temporary  foley cath placement ordered -- removed foley 12/8 and he has been voiding    Essential hypertension  -- weaned off IV diltiazem  infusion to oral diltiazem  30 mg every 6 hours     Hyperlipidemia -- he is reportedly intolerent to statins  -- cardiology had started him on zetia  daily previously but he now says he doesn't take it    Stage 3a CKD -- baseline creatinine 1.1-1.4 - monitor with diuresis   OSA on CPAP -- nightly CPAP ordered   Iron  deficiency anemia -- he is followed by hematology  -- he is no  longer on octreotide  injections -- he is receiving IV iron  infusions.  He received IV iron  12/8 in hospital - pt requesting recheck iron  studies   CAD -- recent cath demonstrated 40% proximal to mid RCA stenosis and 60% PDA stenosis managed with medical therapy by cardiology.  -- he is on aspirin  81 mg daily  - no chest pain   Obesity -BMI 32.49 -lifestyle modification   Hypokalemia -replete -mag - 2.0  Acute gout  -start colchicine  -monitor renal function on colchicine           Family Communication:  Family at bedside 12/14  Consultants:  cardiology  Code Status:  FULL   DVT Prophylaxis:   [place] Lovenox    Procedures: As Listed in Progress Note Above  Antibiotics: Ceftriaxone  12/6>>12/10 Doxy 12/6>12/10    Subjective: Pt complains of left hand pain in fingers.  Denies n/v/d, abd pain, f/c.  States sob is improving.  No cp  Objective: Vitals:   09/26/24 1950 09/26/24 2025 09/27/24 0442 09/27/24 0500  BP: (!) 98/54 128/60 90/60   Pulse: 72  75   Resp: 19  19   Temp: 98.2 F (36.8 C)  98.2 F (36.8 C)   TempSrc: Oral  Oral   SpO2: 95%  97%   Weight:    93.2 kg  Height:        Intake/Output Summary (Last 24 hours) at 09/27/2024 1038 Last data filed at 09/27/2024 0900 Gross per 24 hour  Intake 480 ml  Output 3100 ml  Net -2620 ml   Weight change: -7.5 kg Exam:  General:  Pt is alert, follows commands appropriately, not in acute distress HEENT: No icterus, No thrush, No neck mass, [place]/AT Cardiovascular: RRR, S1/S2, no rubs, no gallops Respiratory: fine bibasilar rales.  Now wheeze Abdomen: Soft/+BS, non tender, non distended, no guarding Extremities: 1 + LE edema, No lymphangitis, No petechiae, No rashes, no synovitis +synovitis left third/fifth PIP   Data Reviewed: I have personally reviewed following labs and imaging studies Basic Metabolic Panel: Recent Labs  Lab 09/22/24 0448 09/23/24 0518 09/24/24 0624 09/25/24 0501 09/26/24 0502  09/27/24 0417  NA 140 141 140 141 140 140  K 3.1* 3.8 3.4* 3.6 3.6 3.6  CL 98 100 98 97* 97* 99  CO2 35* 37* 39* 38* 37* 36*  GLUCOSE 109* 153* 145* 122* 116* 142*  BUN 29* 24* 21 18 17 18   CREATININE 1.18 1.25* 1.21 1.07 1.13 1.13  CALCIUM  8.3* 8.6* 8.5* 8.6* 8.9 8.5*  MG 2.2  --  2.4 2.0 2.0 2.1   Liver Function Tests: Recent Labs  Lab 09/21/24 0335  AST 27  ALT 13  ALKPHOS 98  BILITOT 0.8  PROT 6.0*  ALBUMIN 3.0*   No results for input(s): LIPASE, AMYLASE in the last 168 hours. No results for input(s): AMMONIA in the last 168 hours. Coagulation Profile: No results for input(s): INR,  PROTIME in the last 168 hours. CBC: Recent Labs  Lab 09/21/24 0335 09/22/24 0448 09/23/24 0518  WBC 13.2* 10.2 11.2*  NEUTROABS 11.0* 7.9* 8.6*  HGB 10.5* 11.4* 11.1*  HCT 35.3* 39.8 39.5  MCV 81.0 82.7 84.2  PLT 234 308 363   Cardiac Enzymes: No results for input(s): CKTOTAL, CKMB, CKMBINDEX, TROPONINI in the last 168 hours. BNP: Invalid input(s): POCBNP CBG: Recent Labs  Lab 09/26/24 1129 09/26/24 1634 09/26/24 1951 09/27/24 0307 09/27/24 0743  GLUCAP 143* 145* 253* 159* 121*   HbA1C: No results for input(s): HGBA1C in the last 72 hours. Urine analysis:    Component Value Date/Time   COLORURINE YELLOW 09/19/2024 1124   APPEARANCEUR CLEAR 09/19/2024 1124   APPEARANCEUR Clear 04/09/2024 1623   LABSPEC 1.021 09/19/2024 1124   PHURINE 5.0 09/19/2024 1124   GLUCOSEU >=500 (A) 09/19/2024 1124   HGBUR MODERATE (A) 09/19/2024 1124   BILIRUBINUR NEGATIVE 09/19/2024 1124   BILIRUBINUR Negative 04/09/2024 1623   KETONESUR 5 (A) 09/19/2024 1124   PROTEINUR 30 (A) 09/19/2024 1124   NITRITE NEGATIVE 09/19/2024 1124   LEUKOCYTESUR NEGATIVE 09/19/2024 1124   Sepsis Labs: @LABRCNTIP (procalcitonin:4,lacticidven:4) ) Recent Results (from the past 240 hours)  Blood culture (routine x 2)     Status: Abnormal   Collection Time: 09/19/24 12:51 PM    Specimen: BLOOD RIGHT ARM  Result Value Ref Range Status   Specimen Description   Final    BLOOD RIGHT ARM Performed at [facility], [address], [place], [place] [address]    Special Requests   Final    Blood Culture adequate volume Performed at [facility], [address] [place], [place] [address]    Culture  Setup Time   Final    ANAEROBIC BOTTLE ONLY GRAM POSITIVE RODS Gram Stain Report Called to,Read Back By and Verified With: JAYSON ISLAND AT 1556 ON 12.11.25 BY ADGER J  Performed at [facility], [address], [place], [place] [address]    Culture (A)  Final    CUTIBACTERIUM ACNES Standardized susceptibility testing for this organism is not available. Performed at [facility], [address], [place], [place] [address]    Report Status 09/26/2024 FINAL  Final  Blood culture (routine x 2)     Status: None   Collection Time: 09/19/24 12:56 PM   Specimen: Left Antecubital; Blood  Result Value Ref Range Status   Specimen Description LEFT ANTECUBITAL  Final   Special Requests Blood Culture adequate volume  Final   Culture   Final    NO GROWTH 5 DAYS Performed at [facility], [address] [place], [place] [address]    Report Status 09/24/2024 FINAL  Final  MRSA Next Gen by PCR, Nasal     Status: None   Collection Time: 09/19/24  2:05 PM   Specimen: Nasal Mucosa; Nasal Swab  Result Value Ref Range Status   MRSA by PCR Next Gen NOT DETECTED NOT DETECTED Final    Comment: (NOTE) The GeneXpert MRSA Assay (FDA approved for NASAL specimens only), is one component of a comprehensive MRSA colonization surveillance program. It is not intended to diagnose MRSA infection nor to guide or monitor treatment for MRSA infections. Test performance is not FDA approved in patients less than 4 years old. Performed at [facility], [address] [place], [place] [address]      Scheduled Meds:  acetaminophen   650 mg Oral Q6H   Or   acetaminophen   650 mg Rectal Q6H  aspirin   81 mg Oral Daily   brimonidine   1 drop Both Eyes BID   And   timolol   1 drop Both Eyes BID   Chlorhexidine  Gluconate Cloth  6 each Topical Daily   colchicine   0.6 mg Oral Daily   dapagliflozin  propanediol  10 mg Oral Daily   diltiazem   60 mg Oral BID   dorzolamide   1 drop Both Eyes BID   enoxaparin  (LOVENOX ) injection  40 mg Subcutaneous Q24H   furosemide   80 mg Intravenous BID   gabapentin   100 mg Oral QHS   insulin  aspart  0-9 Units Subcutaneous TID WC   insulin  aspart  2 Units Subcutaneous TID WC   insulin  glargine  30 Units Subcutaneous QHS   liver oil-zinc  oxide   Topical BID   pantoprazole   40 mg Oral QPM   potassium chloride   40 mEq Oral BID   Continuous Infusions:  Procedures/Studies: MR BRAIN WO CONTRAST Result Date: 09/20/2024 EXAM: MRI BRAIN WITHOUT CONTRAST 09/20/2024 02:20:10 PM TECHNIQUE: Multiplanar multisequence MRI of the head/brain was performed without the administration of intravenous contrast. COMPARISON: CT head 10/18/2018 CLINICAL HISTORY: Neuro deficit, acute, stroke suspected. Generalized weakness, rule out acute Cerebrovascular Accident. FINDINGS: BRAIN AND VENTRICLES: No acute infarct. No intracranial hemorrhage. No mass. No midline shift. No hydrocephalus. Mild for age chronic microvascular ischemic change. A few small remote left frontal cortical infarcts. Small remote right cerebellar infarct. Normal flow voids. ORBITS: No acute abnormality. SINUSES AND MASTOIDS: No acute abnormality. BONES AND SOFT TISSUES: Normal marrow signal. No acute soft tissue abnormality. IMPRESSION: 1. No acute intracranial abnormality. Electronically signed by: Gilmore Molt MD 09/20/2024 03:15 PM EST RP Workstation: [id number]   DG CHEST PORT 1 VIEW Result Date: 09/20/2024 EXAM: 1 VIEW(S) XRAY OF THE CHEST 09/20/2024 05:29:00 AM COMPARISON: 09/19/2024 CLINICAL HISTORY: 08790 Atelectasis 91209; 10031 Cough 10031 FINDINGS: LUNGS AND PLEURA: Low lung volumes. Possible left  retrocardiac opacity, favored to reflect atelectasis, but difficult to exclude superimposed airspace opacity. Decreased left pleural effusion. Trace right pleural effusion. No pneumothorax. HEART AND MEDIASTINUM: Cardiomegaly. Atherosclerotic calcifications. BONES AND SOFT TISSUES: No acute osseous abnormality. IMPRESSION: 1. Decreased left pleural effusion and trace right pleural effusion. 2. Possible left retrocardiac opacity, favored to reflect atelectasis, but difficult to exclude superimposed airspace opacity. 3. Cardiomegaly and atherosclerotic calcifications. Electronically signed by: Evalene Coho MD 09/20/2024 06:05 AM EST RP Workstation: [id number]   MR LUMBAR SPINE WO CONTRAST Result Date: 09/19/2024 EXAM: MRI LUMBAR SPINE 09/19/2024 04:34:40 PM TECHNIQUE: Multiplanar multisequence MRI of the lumbar spine was performed without the administration of intravenous contrast. COMPARISON: 04/08/2020 CLINICAL HISTORY: Lumbar radiculopathy, symptoms persist with > 6 wks treatment; Myelopathy, acute, lumbar spine. FINDINGS: BONES AND ALIGNMENT: Normal alignment except for slight retrolisthesis at L2-L3 and L3-L4, which is stable. Normal vertebral body heights. Bone marrow signal is unremarkable except for extensive type 2 Modic changes at L2-L3 and L3-L4, and edematous changes that have progressed at L4-L5 and L5-S1. SPINAL CORD: The conus medullaris terminates at L1. SOFT TISSUES: No paraspinal mass. L1-L2: No significant disc herniation. No spinal canal stenosis or neural foraminal narrowing. L2-L3: Uncovertebral broad-based disc protrusion results in stable mild foraminal narrowing bilaterally. A broad-based disc protrusion and prominent epidural fat results in moderate central stenosis. Severe left and moderate right foraminal narrowing demonstrate some progression since the prior study. L3-L4: Broad-based disc protrusion has progressed. Moderate facet hypertrophy has progressed bilaterally. Epidural  lipomatosis further compromises the thecal sac. Progressive moderate foraminal stenosis is present bilaterally, right greater  than left. L4-L5: Chronic positive disc height is present. Endplate ridging and epidural lipomatosis result in moderate central canal stenosis. Moderate-to-severe right and moderate left foraminal stenosis is stable. L5-S1: Edematous changes have progressed. No significant disc herniation. No spinal canal stenosis or neural foraminal narrowing. IMPRESSION: 1. 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 2. Moderate central canal stenosis at L2-3 from broad-based disc protrusion and prominent epidural fat. 3. Progressive moderate foraminal stenosis bilaterally at L4-5, right greater than left. 4. Severe left and moderate right foraminal narrowing at L2-3 with some progression since prior study. 5. Progression of edematous changes at L4-5 and L5-S1. 6. Progression of broad-based disc protrusion and moderate facet hypertrophy bilaterally at L4-5. Electronically signed by: Lonni Necessary MD 09/19/2024 06:28 PM EST RP Workstation: [id number]   DG Chest Port 1 View Result Date: 09/19/2024 EXAM: 1 VIEW(S) XRAY OF THE CHEST 09/19/2024 11:45:00 AM COMPARISON: 01/20/2024 CLINICAL HISTORY: shortness of breath FINDINGS: LUNGS AND PLEURA: Probable left retrocardiac opacity concerning for atelectasis or infiltrate. Possible effusion. No pneumothorax. HEART AND MEDIASTINUM: Stable cardiomegaly. No acute abnormality of the mediastinal silhouette. BONES AND SOFT TISSUES: No acute osseous abnormality. IMPRESSION: 1. Left retrocardiac opacity suspicious for atelectasis or infiltrate, with possible effusion. 2. Stable cardiomegaly. Electronically signed by: Lynwood Seip MD 09/19/2024 12:16 PM EST RP Workstation: [id number]   DG Knee Complete 4 Views Right Result Date: 09/19/2024 EXAM: 4 OR MORE  VIEW(S) XRAY OF THE RIGHT KNEE 09/19/2024 11:45:00 AM COMPARISON: None available. CLINICAL HISTORY: shortness of breath FINDINGS: BONES AND JOINTS: No acute fracture. No malalignment. Small suprapatellar joint effusion is noted. Minimal narrowing of lateral joint space is noted. SOFT TISSUES: The soft tissues are unremarkable. IMPRESSION: 1. Small suprapatellar joint effusion. 2. Minimal lateral compartment joint space narrowing. Electronically signed by: Lynwood Seip MD 09/19/2024 12:14 PM EST RP Workstation: [id number]   DG Pelvis 1-2 Views Result Date: 09/19/2024 EXAM: 1 or 2 view(s) Xray of the pelvis 09/19/2024 11:45:00 AM COMPARISON: None available. CLINICAL HISTORY: Shortness of breath FINDINGS: BONES AND JOINTS: No acute fracture. No malalignment. SOFT TISSUES: The soft tissues are unremarkable. IMPRESSION: 1. No significant abnormality. Electronically signed by: Lynwood Seip MD 09/19/2024 12:13 PM EST RP Workstation: [id number]   Intravitreal Injection, Pharmacologic Agent - OD - Right Eye Result Date: 09/04/2024 Time Out 09/04/2024. 2:37 PM. Confirmed correct patient, procedure, site, and patient consented. Anesthesia Topical anesthesia was used. Anesthetic medications included Lidocaine  2%, Proparacaine 0.5%. Procedure Preparation included 5% betadine to ocular surface, eyelid speculum. A supplied needle was used. Injection: 6 mg faricimab -svoa 6 MG/0.05ML (Patient supplied)   Route: Intravitreal, Site: Right Eye   NDC: 49757-903-93, Lot: [id number], Expiration date: 07/14/2025, Waste: 0 mL Post-op Post injection exam found visual acuity of at least counting fingers. The patient tolerated the procedure well. There were no complications. The patient received written and verbal post procedure care education. Post injection medications were not given. Notes **PAP MEDICATION ADMINISTERED**   Intravitreal Injection, Pharmacologic Agent - OS - Left Eye Result Date: 09/04/2024 Time Out 09/04/2024. 2:37 PM.  Confirmed correct patient, procedure, site, and patient consented. Anesthesia Topical anesthesia was used. Anesthetic medications included Lidocaine  2%, Proparacaine 0.5%. Procedure Preparation included 5% betadine to ocular surface, eyelid speculum. A supplied (32g) needle was used. Injection: 6 mg faricimab -svoa 6 MG/0.05ML (Patient supplied)   Route: Intravitreal, Site: Left Eye   NDC: 49757-903-93, Lot: [id number], Expiration date: 07/14/2025, Waste: 0 mL Post-op Post  injection exam found visual acuity of at least counting fingers. The patient tolerated the procedure well. There were no complications. The patient received written and verbal post procedure care education. Post injection medications were not given. Notes **PAP MEDICATION ADMINISTERED**  OCT, Retina - OU - Both Eyes Result Date: 09/04/2024 Right Eye Quality was good. Central Foveal Thickness: 269. Progression has improved. Findings include normal foveal contour, no SRF, intraretinal fluid, vitreomacular adhesion (Interval resolution of trace cystic changes ST fovea, partial PVD). Left Eye Quality was good. Central Foveal Thickness: 269. Progression has been stable. Findings include normal foveal contour, no IRF, no SRF, intraretinal hyper-reflective material (persistent cystic changes and punctate IRHM, partial PVD). Notes *Images captured and stored on drive Diagnosis / Impression: +DME OU OD: Interval resolution of trace cystic changes ST fovea,  partial PVD OS: persistent cystic changes and punctate IRHM, partial PVD Clinical management: See below Abbreviations: NFP - Normal foveal profile. CME - cystoid macular edema. PED - pigment epithelial detachment. IRF - intraretinal fluid. SRF - subretinal fluid. EZ - ellipsoid zone. ERM - epiretinal membrane. ORA - outer retinal atrophy. ORT - outer retinal tubulation. SRHM - subretinal hyper-reflective material. IRHM - intraretinal hyper-reflective material   Alm Schneider, DO  [facility]  If 7PM-7AM, please contact night-coverage [email or web address] Password [email or web address] 09/27/2024, 10:38 AM   LOS: 8 days

## 2024-09-28 DIAGNOSIS — I4891 Unspecified atrial fibrillation: Secondary | ICD-10-CM | POA: Diagnosis not present

## 2024-09-28 LAB — BASIC METABOLIC PANEL WITH GFR
Anion gap: 4 — ABNORMAL LOW (ref 5–15)
BUN: 17 mg/dL (ref 8–23)
CO2: 36 mmol/L — ABNORMAL HIGH (ref 22–32)
Calcium: 8.5 mg/dL — ABNORMAL LOW (ref 8.9–10.3)
Chloride: 99 mmol/L (ref 98–111)
Creatinine, Ser: 1.11 mg/dL (ref 0.61–1.24)
GFR, Estimated: >60 mL/min (ref >60)
Glucose, Bld: 152 mg/dL — ABNORMAL HIGH (ref 70–99)
Potassium: 3.9 mmol/L (ref 3.5–5.1)
Sodium: 139 mmol/L (ref 135–145)

## 2024-09-28 LAB — IRON AND TIBC
Iron: 19 ug/dL — ABNORMAL LOW (ref 45–182)
Saturation Ratios: 7 % — ABNORMAL LOW (ref 17.9–39.5)
TIBC: 274 ug/dL (ref 250–450)
UIBC: 256 ug/dL

## 2024-09-28 LAB — FERRITIN: Ferritin: 84 ng/mL (ref 24–336)

## 2024-09-28 LAB — MAGNESIUM: Magnesium: 2.1 mg/dL (ref 1.7–2.4)

## 2024-09-28 LAB — GLUCOSE, CAPILLARY
Glucose-Capillary: 170 mg/dL — ABNORMAL HIGH (ref 70–99)
Glucose-Capillary: 105 mg/dL — ABNORMAL HIGH (ref 70–99)
Glucose-Capillary: 167 mg/dL — ABNORMAL HIGH (ref 70–99)
Glucose-Capillary: 138 mg/dL — ABNORMAL HIGH (ref 70–99)
Glucose-Capillary: 231 mg/dL — ABNORMAL HIGH (ref 70–99)

## 2024-09-28 MED ORDER — COLCHICINE 0.6 MG PO TABS
0.6000 mg | ORAL_TABLET | Freq: Once | ORAL | Status: AC
  Administered 2024-09-28: 15:00:00 0.6 mg via ORAL
  Filled 2024-09-28: qty 1

## 2024-09-28 MED ORDER — SODIUM CHLORIDE 0.9 % IV SOLN
200.0000 mg | Freq: Once | INTRAVENOUS | Status: AC
  Administered 2024-09-28: 23:00:00 200 mg via INTRAVENOUS
  Filled 2024-09-28: qty 10

## 2024-09-28 MED ORDER — DILTIAZEM HCL ER COATED BEADS 120 MG PO CP24
120.0000 mg | ORAL_CAPSULE | Freq: Every day | ORAL | Status: DC
  Administered 2024-09-29: 09:00:00 120 mg via ORAL
  Filled 2024-09-28: qty 1

## 2024-09-28 MED ORDER — TORSEMIDE 20 MG PO TABS
40.0000 mg | ORAL_TABLET | Freq: Every morning | ORAL | Status: DC
  Administered 2024-09-29: 06:00:00 40 mg via ORAL
  Filled 2024-09-28: qty 2

## 2024-09-28 MED ORDER — IRON SUCROSE 200 MG IVPB - SIMPLE MED: 200.0000 mg | Freq: Once | Status: DC

## 2024-09-28 MED ORDER — TORSEMIDE 20 MG PO TABS
20.0000 mg | ORAL_TABLET | Freq: Every evening | ORAL | Status: DC
  Administered 2024-09-28: 17:00:00 20 mg via ORAL
  Filled 2024-09-28: qty 1

## 2024-09-28 MED ORDER — IRON SUCROSE 200 MG IVPB - SIMPLE MED
200.0000 mg | Freq: Once | Status: DC
  Filled 2024-09-28: qty 110

## 2024-09-28 MED ADMIN — Diltiazem HCl Tab 60 MG: 60 mg | ORAL | @ 17:00:00 | NDC 60687072811

## 2024-09-28 MED ADMIN — Diltiazem HCl Tab 60 MG: 60 mg | ORAL | @ 09:00:00 | NDC 60687072811

## 2024-09-28 NOTE — Plan of Care (Signed)
   Problem: Education: Goal: Knowledge of General Education information will improve Description Including pain rating scale, medication(s)/side effects and non-pharmacologic comfort measures Outcome: Progressing

## 2024-09-28 NOTE — Progress Notes (Signed)
 Mobility Specialist Progress Note:    09/28/24 0941  Mobility  Activity Ambulated with assistance  Level of Assistance Minimal assist, patient does 75% or more  Assistive Device Front wheel walker  Distance Ambulated (ft) 10 ft  Range of Motion/Exercises Active;All extremities  Activity Response Tolerated well  Mobility Referral Yes  Mobility visit 1 Mobility  Mobility Specialist Start Time (ACUTE ONLY) 0941  Mobility Specialist Stop Time (ACUTE ONLY) 1002  Mobility Specialist Time Calculation (min) (ACUTE ONLY) 21 min   Pt received in chair, requesting assistance to bathroom. Required MinA to stand and CGA to ambulate with RW. Tolerated well, left in bathroom. Notified pt to pull string when finished, NT notified. All needs met.  Tianna Baus Mobility Specialist Please contact via Special Educational Needs Teacher or  Rehab office at [phone]

## 2024-09-28 NOTE — Plan of Care (Signed)
   Problem: Education: Goal: Knowledge of General Education information will improve Description: Including pain rating scale, medication(s)/side effects and non-pharmacologic comfort measures Outcome: Progressing   Problem: Clinical Measurements: Goal: Ability to maintain clinical measurements within normal limits will improve Outcome: Progressing Goal: Diagnostic test results will improve Outcome: Progressing

## 2024-09-28 NOTE — Plan of Care (Signed)
 Problem: Education: Goal: Knowledge of General Education information will improve Description: Including pain rating scale, medication(s)/side effects and non-pharmacologic comfort measures Outcome: Progressing   Problem: Health Behavior/Discharge Planning: Goal: Ability to manage health-related needs will improve Outcome: Progressing   Problem: Clinical Measurements: Goal: Ability to maintain clinical measurements within normal limits will improve Outcome: Progressing Goal: Will remain free from infection Outcome: Progressing Goal: Diagnostic test results will improve Outcome: Progressing Goal: Respiratory complications will improve Outcome: Progressing Goal: Cardiovascular complication will be avoided Outcome: Progressing   Problem: Activity: Goal: Risk for activity intolerance will decrease Outcome: Progressing   Problem: Nutrition: Goal: Adequate nutrition will be maintained Outcome: Progressing   Problem: Coping: Goal: Level of anxiety will decrease Outcome: Progressing   Problem: Elimination: Goal: Will not experience complications related to bowel motility Outcome: Progressing Goal: Will not experience complications related to urinary retention Outcome: Progressing   Problem: Pain Managment: Goal: General experience of comfort will improve and/or be controlled Outcome: Progressing   Problem: Safety: Goal: Ability to remain free from injury will improve Outcome: Progressing   Problem: Skin Integrity: Goal: Risk for impaired skin integrity will decrease Outcome: Progressing   Problem: Education: Goal: Ability to describe self-care measures that may prevent or decrease complications (Diabetes Survival Skills Education) will improve Outcome: Progressing Goal: Individualized Educational Video(s) Outcome: Progressing   Problem: Coping: Goal: Ability to adjust to condition or change in health will improve Outcome: Progressing   Problem: Fluid  Volume: Goal: Ability to maintain a balanced intake and output will improve Outcome: Progressing   Problem: Health Behavior/Discharge Planning: Goal: Ability to identify and utilize available resources and services will improve Outcome: Progressing Goal: Ability to manage health-related needs will improve Outcome: Progressing   Problem: Metabolic: Goal: Ability to maintain appropriate glucose levels will improve Outcome: Progressing   Problem: Nutritional: Goal: Maintenance of adequate nutrition will improve Outcome: Progressing Goal: Progress toward achieving an optimal weight will improve Outcome: Progressing   Problem: Skin Integrity: Goal: Risk for impaired skin integrity will decrease Outcome: Progressing   Problem: Tissue Perfusion: Goal: Adequacy of tissue perfusion will improve Outcome: Progressing   Problem: Education: Goal: Ability to demonstrate management of disease process will improve Outcome: Progressing Goal: Ability to verbalize understanding of medication therapies will improve Outcome: Progressing Goal: Individualized Educational Video(s) Outcome: Progressing   Problem: Activity: Goal: Capacity to carry out activities will improve Outcome: Progressing   Problem: Cardiac: Goal: Ability to achieve and maintain adequate cardiopulmonary perfusion will improve Outcome: Progressing   Problem: Education: Goal: Knowledge of disease or condition will improve Outcome: Progressing Goal: Understanding of medication regimen will improve Outcome: Progressing Goal: Individualized Educational Video(s) Outcome: Progressing   Problem: Activity: Goal: Ability to tolerate increased activity will improve Outcome: Progressing   Problem: Cardiac: Goal: Ability to achieve and maintain adequate cardiopulmonary perfusion will improve Outcome: Progressing   Problem: Health Behavior/Discharge Planning: Goal: Ability to safely manage health-related needs after  discharge will improve Outcome: Progressing   Problem: Education: Goal: Knowledge of disease and its progression will improve Outcome: Progressing Goal: Individualized Educational Video(s) Outcome: Progressing   Problem: Fluid Volume: Goal: Compliance with measures to maintain balanced fluid volume will improve Outcome: Progressing   Problem: Health Behavior/Discharge Planning: Goal: Ability to manage health-related needs will improve Outcome: Progressing   Problem: Nutritional: Goal: Ability to make healthy dietary choices will improve Outcome: Progressing   Problem: Clinical Measurements: Goal: Complications related to the disease process, condition or treatment will be avoided or minimized Outcome: Progressing

## 2024-09-28 NOTE — Progress Notes (Signed)
 Rounding Note   Patient Name: Lucas Dudley Date of Encounter: 09/28/2024  [facility] Cardiologist: [name], MD   Subjective No complaints  Scheduled Meds:  acetaminophen   650 mg Oral Q6H   Or   acetaminophen   650 mg Rectal Q6H   aspirin   81 mg Oral Daily   brimonidine   1 drop Both Eyes BID   And   timolol   1 drop Both Eyes BID   Chlorhexidine  Gluconate Cloth  6 each Topical Daily   colchicine   0.6 mg Oral Daily   dapagliflozin  propanediol  10 mg Oral Daily   diltiazem   60 mg Oral BID   dorzolamide   1 drop Both Eyes BID   enoxaparin  (LOVENOX ) injection  40 mg Subcutaneous Q24H   furosemide   80 mg Intravenous BID   gabapentin   100 mg Oral QHS   insulin  aspart  0-9 Units Subcutaneous TID WC   insulin  aspart  2 Units Subcutaneous TID WC   insulin  glargine  30 Units Subcutaneous QHS   liver oil-zinc  oxide   Topical BID   pantoprazole   40 mg Oral QPM   potassium chloride   40 mEq Oral BID   Continuous Infusions:  PRN Meds: bisacodyl , ipratropium-albuterol , LORazepam , ondansetron  **OR** ondansetron  (ZOFRAN ) IV, mouth rinse, traZODone    Vital Signs  Vitals:   09/27/24 1338 09/27/24 2011 09/28/24 0324 09/28/24 0500  BP: 110/77 112/72 130/72   Pulse: 80 74 66   Resp:  18 18   Temp: 98.7 F (37.1 C) 97.9 F (36.6 C) 98.2 F (36.8 C)   TempSrc: Oral Oral Oral   SpO2: 93% 95% 97%   Weight:    97.5 kg  Height:        Intake/Output Summary (Last 24 hours) at 09/28/2024 1001 Last data filed at 09/28/2024 0948 Gross per 24 hour  Intake --  Output 2900 ml  Net -2900 ml      09/28/2024    5:00 AM 09/27/2024    5:00 AM 09/26/2024    5:00 AM  Last 3 Weights  Weight (lbs) 214 lb 15.2 oz 205 lb 7.5 oz 203 lb 8 oz  Weight (kg) 97.5 kg 93.2 kg 92.307 kg      Telemetry Rate controlled afib - Personally Reviewed  ECG  N/a - Personally Reviewed  Physical Exam  GEN: No acute distress.   Neck: No JVD Cardiac: irreg Respiratory: Clear to  auscultation bilaterally. GI: Soft, nontender, non-distended  MS: 1+ bilateral LE edema Neuro:  Nonfocal  Psych: Normal affect   Labs High Sensitivity Troponin:  No results for input(s): TROPONINIHS in the last 720 hours.  Recent Labs  Lab 09/19/24 1128 09/19/24 1325  TRNPT 91* 84*       Chemistry Recent Labs  Lab 09/26/24 0502 09/27/24 0417 09/28/24 0411  NA 140 140 139  K 3.6 3.6 3.9  CL 97* 99 99  CO2 37* 36* 36*  GLUCOSE 116* 142* 152*  BUN 17 18 17   CREATININE 1.13 1.13 1.11  CALCIUM  8.9 8.5* 8.5*  MG 2.0 2.1 2.1  GFRNONAA >60 >60 >60  ANIONGAP 5 5 4*    Lipids No results for input(s): CHOL, TRIG, HDL, LABVLDL, LDLCALC, CHOLHDL in the last 168 hours.  Hematology Recent Labs  Lab 09/22/24 0448 09/23/24 0518  WBC 10.2 11.2*  RBC 4.81 4.69  HGB 11.4* 11.1*  HCT 39.8 39.5  MCV 82.7 84.2  MCH 23.7* 23.7*  MCHC 28.6* 28.1*  RDW 18.1* 18.5*  PLT 308 363  Thyroid  No results for input(s): TSH, FREET4 in the last 168 hours.  BNPNo results for input(s): BNP, PROBNP in the last 168 hours.  DDimer No results for input(s): DDIMER in the last 168 hours.   Radiology  ECHOCARDIOGRAM COMPLETE Result Date: 09/27/2024    ECHOCARDIOGRAM REPORT   Patient Name:   Lucas Dudley Date of Exam: 09/27/2024 Medical Rec #:  [id number]     Height:       70.0 in Accession #:    [id number]    Weight:       205.5 lb Date of Birth:  May 02, 1941      BSA:          2.111 m Patient Age:    83 years      BP:           90/60 mmHg Patient Gender: M             HR:           85 bpm. Exam Location:  Inpatient Procedure: 2D Echo (Both Spectral and Color Flow Doppler were utilized during            procedure). Indications:    acute diastolic chf. Bacteremia.  History:        Patient has prior history of Echocardiogram examinations, most                 recent 02/11/2024. CHF, chronic kidney disease, Arrythmias:Atrial                 Fibrillation; Risk Factors:Sleep Apnea,  Hypertension and                 Dyslipidemia.  Sonographer:    Tinnie Barefoot RDCS Referring Phys: [id number] DAVID TAT IMPRESSIONS  1. Left ventricular ejection fraction, by estimation, is 40 to 45%. Left ventricular ejection fraction by 2D MOD biplane is 44.8 %. The left ventricle has mildly decreased function. The left ventricle demonstrates global hypokinesis. There is mild left ventricular hypertrophy. Left ventricular diastolic function could not be evaluated.  2. Right ventricular systolic function is mildly reduced. The right ventricular size is mildly enlarged. There is severely elevated pulmonary artery systolic pressure. The estimated right ventricular systolic pressure is 73.1 mmHg.  3. Left atrial size was moderately dilated.  4. Right atrial size was mildly dilated.  5. The mitral valve was not well visualized. Moderate mitral valve regurgitation.  6. Tricuspid valve regurgitation is mild to moderate.  7. The aortic valve is tricuspid. Aortic valve regurgitation is not visualized. Aortic valve sclerosis/calcification is present, without any evidence of aortic stenosis.  8. The inferior vena cava is dilated in size with <50% respiratory variability, suggesting right atrial pressure of 15 mmHg.  9. Rhythm strip during this exam demonstrates atrial fibrillation. Comparison(s): Changes from prior study are noted. 02/11/2024: LVEF 55-60%. FINDINGS  Left Ventricle: Left ventricular ejection fraction, by estimation, is 40 to 45%. Left ventricular ejection fraction by 2D MOD biplane is 44.8 %. The left ventricle has mildly decreased function. The left ventricle demonstrates global hypokinesis. The left ventricular internal cavity size was normal in size. There is mild left ventricular hypertrophy. Left ventricular diastolic function could not be evaluated due to atrial fibrillation. Left ventricular diastolic function could not be evaluated. Right Ventricle: The right ventricular size is mildly enlarged. No  increase in right ventricular wall thickness. Right ventricular systolic function is mildly reduced. There is severely elevated pulmonary artery systolic pressure. The tricuspid regurgitant velocity  is 3.81 m/s, and with an assumed right atrial pressure of 15 mmHg, the estimated right ventricular systolic pressure is 73.1 mmHg. Left Atrium: Left atrial size was moderately dilated. Right Atrium: Right atrial size was mildly dilated. Pericardium: There is no evidence of pericardial effusion. Mitral Valve: The mitral valve was not well visualized. Moderate mitral valve regurgitation. Tricuspid Valve: The tricuspid valve is grossly normal. Tricuspid valve regurgitation is mild to moderate. Aortic Valve: The aortic valve is tricuspid. Aortic valve regurgitation is not visualized. Aortic valve sclerosis/calcification is present, without any evidence of aortic stenosis. Pulmonic Valve: The pulmonic valve was grossly normal. Pulmonic valve regurgitation is trivial. Aorta: The aortic root and ascending aorta are structurally normal, with no evidence of dilitation. Venous: The inferior vena cava is dilated in size with less than 50% respiratory variability, suggesting right atrial pressure of 15 mmHg. IAS/Shunts: No atrial level shunt detected by color flow Doppler. EKG: Rhythm strip during this exam demonstrates atrial fibrillation.  LEFT VENTRICLE PLAX 2D                        Biplane EF (MOD) LVIDd:         5.90 cm         LV Biplane EF:   Left LVIDs:         4.65 cm                          ventricular LV PW:         1.20 cm                          ejection LV IVS:        1.40 cm                          fraction by LVOT diam:     2.50 cm                          2D MOD LV SV:         55                               biplane is LV SV Index:   26                               44.8 %. LVOT Area:     4.91 cm  LV Volumes (MOD) LV vol d, MOD    106.0 ml A2C: LV vol d, MOD    137.0 ml A4C: LV vol s, MOD    58.4 ml A2C: LV  vol s, MOD    77.8 ml A4C: LV SV MOD A2C:   47.6 ml LV SV MOD A4C:   137.0 ml LV SV MOD BP:    55.1 ml RIGHT VENTRICLE          IVC RV Basal diam:  3.90 cm  IVC diam: 2.30 cm LEFT ATRIUM             Index        RIGHT ATRIUM           Index LA diam:        4.30 cm 2.04  cm/m   RA Area:     20.80 cm LA Vol (A2C):   94.5 ml 44.76 ml/m  RA Volume:   61.30 ml  29.03 ml/m LA Vol (A4C):   90.6 ml 42.91 ml/m LA Biplane Vol: 94.6 ml 44.80 ml/m  AORTIC VALVE LVOT Vmax:   66.88 cm/s LVOT Vmean:  40.825 cm/s LVOT VTI:    0.112 m  AORTA Ao Root diam: 3.40 cm MR Peak grad: 75.7 mmHg   TRICUSPID VALVE MR Mean grad: 60.0 mmHg   TR Peak grad:   58.1 mmHg MR Vmax:      435.00 cm/s TR Vmax:        381.00 cm/s MR Vmean:     369.0 cm/s                           SHUNTS                           Systemic VTI:  0.11 m                           Systemic Diam: 2.50 cm Vinie Maxcy MD Electronically signed by Vinie Maxcy MD Signature Date/Time: 09/27/2024/4:25:22 PM    Final       Patient Profile   Lucas Dudley is a 83 y.o. 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   Assessment & Plan  1. PAF - has watchman device - RVR on admission. Was found down at home after fall not able to get up - RVR in setting of fall, pneumonia   - initially on dilt drip, now off. Currently on oral dilt 60mg  bid. Rates 60s to 80s.  - rates well controlled on current regimen. Of note at home was self rate controlled. - transition to long acting dilt 120mg  tomorrow.     2.Pneumonia - abx per primary team   3.Acute on chronic HFpEF - 12/2023 echo: LVEF 55-60%, indet diastolic, mild RV dysfunction, severe pulm HTN PASP 63, dilated fixed IVC,  01/2024 limited echo: LVEF 55-60%, no WMAs, indet diastolic function, mild RV dysfunction, mod pulm HTN PASP 47.   Elevated  proBNP, CXR without clear edema. Has chronic LE edema. Body habitus somewhat difficult to assess volume status otherwise   -he is on IV lasix  80mg  bid. I/Os incomplete this admission, roughly negative 2.4 L yesterday and 13 L for admission. Weights appear inaccurate,recheck standing weight. Yesterday down to 205 lbs, variable data but roughly down about 20 lbs this admission. Renal function is stable.  - he has chronic LE edema, not precise guide on his volume status - appears near euvolemic, stop IV lasix .  - start toresmide 40mg  in AM and 20mg  in PM (was on 20mg  bid on admission)     4.CAD -01/2024 cath: mod nonobstructive CAD, mean PA 32, PCWP 16, PVR 2.7, CI 2.59 - no acute issues     5. Pulmonary HTN - 12/2023 echo: LVEF 55-60%, indet diastolic, mild RV dysfunction, severe pulm HTN PASP 63, dilated fixed IVC,  - has history of OSA, HFpEF. Unclear if any additional etiologies. 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    6. Pneumonia - per primary team  We will sign off inpatient care and arrange outpatient f/u.    For questions or updates, please contact [facility]      Please consult [email or web address] for contact info under       Signed, Alvan Carrier, MD  09/28/2024, 10:01 AM

## 2024-09-28 NOTE — TOC Progression Note (Signed)
 Transition of Care [facility]) - Progression Note    Patient Details  Name: Lucas Dudley MRN: [id number] Date of Birth: 04/05/1941  Transition of Care [facility]) CM/SW Contact  Hoy DELENA Bigness, LCSW Phone Number: 09/28/2024, 11:35 AM  Clinical Narrative:    Insurance auth requested for SNF placement and is currently pending.    Expected Discharge Plan: Skilled Nursing Facility Barriers to Discharge: Continued Medical Work up               Expected Discharge Plan and Services     Post Acute Care Choice: Home Health Living arrangements for the past 2 months: Single Family Home                                       Social Drivers of Health (SDOH) Interventions SDOH Screenings   Food Insecurity: No Food Insecurity (09/19/2024)  Housing: Low Risk (09/19/2024)  Transportation Needs: No Transportation Needs (09/19/2024)  Utilities: Not At Risk (09/19/2024)  Alcohol Screen: Low Risk (01/27/2024)  Depression (PHQ2-9): Low Risk (09/16/2024)  Financial Resource Strain: Low Risk (02/10/2024)  Physical Activity: Inactive (02/10/2024)  Social Connections: Moderately Integrated (09/19/2024)  Stress: No Stress Concern Present (02/10/2024)  Tobacco Use: Medium Risk (09/20/2024)  Health Literacy: Adequate Health Literacy (11/20/2023)    Readmission Risk Interventions    09/25/2024   10:46 AM 09/24/2024    3:40 PM 01/28/2024    1:28 PM  Readmission Risk Prevention Plan  Transportation Screening Complete Complete Complete  Medication Review Oceanographer) Complete  Referral to Pharmacy  PCP or Specialist appointment within 3-5 days of discharge  Complete Complete  HRI or Home Care Consult Complete Complete Complete  SW Recovery Care/Counseling Consult Complete Complete Complete  Palliative Care Screening Not Applicable Not Applicable Not Applicable  Skilled Nursing Facility Complete Complete Not Applicable

## 2024-09-28 NOTE — Progress Notes (Signed)
 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  He apparently had a fall about 4 days ago at home.  He did not seek medical treatment at the time.  He fell again 2 days ago and was not able to get up and had been on the floor for about 1-2 days.  He has been having difficulty for past few months with lower extremity weakness and his legs giving out.  They have not been able to determine the cause of this.  His daughter called EMS to check on him because she had not heard from him in a couple of days.  EMS found him to have right knee swelling.  He complained of shortness of breath and knee pain.  He has not had his medications in a couple of days.  The ED found him to be tachycardic in A-fib with RVR and was hypoxic and placed on 3 L nasal cannula.  His chest x-ray was suspicious for atelectasis and/or pneumonia.  He was started on IV diltiazem  infusion, IV antibiotics, Lasix , IV fluids.  He was noted to be tachypneic and had normal venous pH.  His blood sugar was 239.  His CK was 518.  Creatinine 1.44, GFR 48.5 mL/min and troponin 91.  White blood cell count 17.4, hemoglobin 11.9, platelets 271.  Bilirubin 2.2.  His trauma workup revealed normal pelvis imaging, right knee imaging reveals small suprapatellar joint effusion.  Admission requested for further management.    PT Comments  Patient presents in chair and agreeable for therapy. Patient demonstrates fair/good return for completing BLE ROM/strengthening  exercises with verbal cueing, required increased time with labored movement for completing sit to stands from chair, had increased endurance/distance for gait training using RW without loss of balance, but limited mostly due to c/o fatigue and generalized weakness. Patient tolerated staying  up in chair after therapy with visitors present. Patient will benefit from continued skilled physical therapy in hospital and recommended venue below to increase strength, balance, endurance for safe ADLs and gait.       If plan is discharge home, recommend the following: A lot of help with bathing/dressing/bathroom;A lot of help with walking and/or transfers;Help with stairs or ramp for entrance;Assistance with cooking/housework;Assist for transportation   Can travel by private vehicle     Yes  Equipment Recommendations  None recommended by PT    Recommendations for Other Services       Precautions / Restrictions Precautions Precautions: Fall Recall of Precautions/Restrictions: Intact Restrictions Weight Bearing Restrictions Per Provider Order: No     Mobility  Bed Mobility               General bed mobility comments: Patient presents seated in chair (assisted by nursing staff)    Transfers Overall transfer level: Needs assistance Equipment used: Rolling walker (2 wheels) Transfers: Sit to/from Stand, Bed to chair/wheelchair/BSC Sit to Stand: Min assist, Mod assist   Step pivot transfers: Min assist, Mod assist       General transfer comment: increased time, labored movement    Ambulation/Gait Ambulation/Gait assistance: Min assist Gait Distance (Feet): 45 Feet Assistive  device: Rolling walker (2 wheels) Gait Pattern/deviations: Decreased step length - right, Decreased step length - left, Decreased stride length Gait velocity: Dec     General Gait Details: increased endurance/distance for gait training with slow labored movement, no loss of balance, limited mostly due to  fatigue   Stairs             Wheelchair Mobility     Tilt Bed    Modified Rankin (Stroke Patients Only)       Balance Overall balance assessment: Needs assistance Sitting-balance support: Feet supported, No upper extremity supported Sitting balance-Leahy Scale: Fair Sitting balance - Comments: fair/good seated in chair   Standing balance support: Reliant on assistive device for balance, During functional activity, Bilateral upper extremity supported Standing balance-Leahy Scale: Fair Standing balance comment: using RW                            Communication Communication Communication: No apparent difficulties  Cognition Arousal: Alert Behavior During Therapy: WFL for tasks assessed/performed   PT - Cognitive impairments: No apparent impairments                         Following commands: Intact      Cueing Cueing Techniques: Verbal cues  Exercises General Exercises - Lower Extremity Long Arc Quad: Seated, AROM, Strengthening, Both, 10 reps Hip Flexion/Marching: Seated, AROM, Strengthening, Both, 10 reps Toe Raises: Seated, AROM, Strengthening, Both, 10 reps Heel Raises: Seated, AROM, Strengthening, Both, 10 reps    General Comments        Pertinent Vitals/Pain Pain Assessment Pain Assessment: Faces Faces Pain Scale: Hurts little more Pain Location: left hand, shoulder Pain Intervention(s): Limited activity within patient's tolerance, Monitored during session, Repositioned    Home Living                          Prior Function            PT Goals (current goals can now be found in the care plan section) Acute Rehab PT Goals Patient Stated Goal: return home after rehab PT Goal Formulation: With patient Time For Goal Achievement: 10/05/24 Potential to Achieve Goals: Good Progress towards PT goals: Progressing toward goals    Frequency    Min 3X/week      PT Plan      Co-evaluation               AM-PAC PT 6 Clicks Mobility   Outcome Measure  Help needed turning from your back to your side while in a flat bed without using bedrails?: A Little Help needed moving from lying on your back to sitting on the side of a flat bed without using bedrails?: A Lot Help needed moving to and from a bed to a chair (including a wheelchair)?: A Little Help needed standing up from a chair using your arms (e.g., wheelchair or bedside chair)?: A Little Help needed to walk in hospital room?: A Little Help needed climbing 3-5 steps with a railing? : A Lot 6 Click Score: 16    End of Session   Activity Tolerance: Patient tolerated treatment well;Patient limited by fatigue Patient left: in chair;with call bell/phone within reach;with family/visitor present Nurse Communication: Mobility status PT Visit Diagnosis: Unsteadiness on feet (R26.81);Other abnormalities of gait and mobility (R26.89);Muscle weakness (generalized) (M62.81)     Time: 8963-8896 PT Time Calculation (min) (  ACUTE ONLY): 27 min  Charges:    $Gait Training: 8-22 mins $Therapeutic Exercise: 8-22 mins PT General Charges $$ ACUTE PT VISIT: 1 Visit                     11:24 AM, 09/28/2024 Lynwood Music, MPT Physical Therapist with [facility] [phone] office [phone] mobile phone

## 2024-09-28 NOTE — Progress Notes (Addendum)
 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  He apparently had a fall about 4 days ago at home.  He did not seek medical treatment at the time.  He fell again 2 days ago and was not able to get up and had been on the floor for about 1-2 days.  He has been having difficulty for past few months with lower extremity weakness and his legs giving out.  They have not been able to determine the cause of this.  His daughter called EMS to check on him because she had not heard from him in a couple of days.  EMS found him to have right knee swelling.  He complained of shortness of breath and knee pain.  He has not had his medications in a couple of days.  The ED found him to be tachycardic in A-fib with RVR and was hypoxic and placed on 3 L nasal cannula.  His chest x-ray was suspicious for atelectasis and/or pneumonia.  He was started on IV diltiazem  infusion, IV antibiotics, Lasix , IV fluids.  He was noted to be tachypneic and had normal venous pH.  His blood sugar was 239.  His CK was 518.  Creatinine 1.44, GFR 48.5 mL/min and troponin 91.  White blood cell count 17.4, hemoglobin 11.9, platelets 271.  Bilirubin 2.2.  His trauma workup revealed normal pelvis imaging, right knee imaging reveals small suprapatellar joint effusion.  Admission requested for further management.   Assessment/Plan: Paroxysmal Atrial fibrillation with RVR -- he was started on IV diltiazem  infusion in ED -- admitted to stepdown ICU --- transfer to telemetry bed  -- weaned off  diltiazem  infusion to oral Cardizem  30 mg every 6 hours  -- he is s/p watchman device as he could not tolerate full anticoagulation -- he previously had controlled rate Afib without AV nodal agent per cardiology notes PTA - continue short acting diltiazem >>convert to long acting    Acute on Chronic HFpEF -- ReDS vest 40% on 12/9 -- lasix  increased to 80 mg IV bid -- appreciate cardiology - down to 228>>>213 lbs - I/O incomplete, but NEG 13L - 12/15--transition to po torsemide    Bacteremia -Cutibacterium acnes 1/2 sets -likely contaminant -12/14 Echo EF 40-45%, global HK, mild reduced RVF, RVSP 73.1   Lobar  pneumonia Sepsis ruled out  -- 12/7 CXR with LLL opacity -- IV antibiotics--finished 5 days doxy/ceftriaxone    Generalized weakness Adult failure to thrive  Lumbar radiculopathy with leg weakness -- discussed with daughter symptoms of leg weakness progressing and requested MRI L spine -- if patient can tolerate will try to obtain MRI L spine without contrast  -- MRI with findings of 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  Will obtain PT evaluation.  MRI brain ordered to rule out acute CVA -- MRI brain negative for CVA -- Pt recommending SNF and patient is agreeable.   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 -- continue semglee  30 units and novolog  2 units with meals -- SSI coverage and frequent CBG monitoring ordered -- 09/19/24 A1c 11.1%    Acute urinary retention -- I/O  caths done in ED -- temporary foley cath placement ordered -- removed foley 12/8 and he has been voiding    Essential hypertension  -- weaned off IV diltiazem  infusion to oral diltiazem  30 mg every 6 hours   -now transitioned to long acting diltiazem    Hyperlipidemia -- he is reportedly intolerent to statins  -- cardiology had started him on zetia  daily previously but he now says he doesn't take it    Stage 3a CKD -- baseline creatinine 1.1-1.4 - monitor with diuresis   OSA on  CPAP -- nightly CPAP ordered   Iron  deficiency anemia -- he is followed by hematology  -- he is no longer on octreotide  injections -- he is receiving IV iron  infusions.  He received IV iron  12/8 in hospital - pt requesting recheck iron  studies   CAD -- recent cath demonstrated 40% proximal to mid RCA stenosis and 60% PDA stenosis managed with medical therapy by cardiology.  -- he is on aspirin  81 mg daily  - no chest pain   Obesity -BMI 32.49 -lifestyle modification   Hypokalemia -replete -mag - 2.0   Acute gout  -start colchicine  -monitor renal function on colchicine                  Family Communication:  Family at bedside 12/15   Consultants:  cardiology   Code Status:  FULL    DVT Prophylaxis:   [place] Lovenox      Procedures: As Listed in Progress Note Above   Antibiotics: Ceftriaxone  12/6>>12/10 Doxy 12/6>12/10             Subjective: Patient denies fevers, chills, headache, chest pain, dyspnea, nausea, vomiting, diarrhea, abdominal pain, dysuria, hematuria, hematochezia, and melena.   Objective: Vitals:   09/28/24 0324 09/28/24 0500 09/28/24 1045 09/28/24 1325  BP: 130/72   105/74  Pulse: 66   (!) 53  Resp: 18     Temp: 98.2 F (36.8 C)   97.6 F (36.4 C)  TempSrc: Oral   Oral  SpO2: 97%   94%  Weight:  97.5 kg 97 kg   Height:        Intake/Output Summary (Last 24 hours) at 09/28/2024 1804 Last data filed at 09/28/2024 0948 Gross per 24 hour  Intake --  Output 2900 ml  Net -2900 ml   Weight change: 4.3 kg Exam:  General:  Pt is alert, follows commands appropriately, not in acute distress HEENT: No icterus, No thrush, No neck mass, [place]/AT Cardiovascular: RRR, S1/S2, no rubs, no gallops Respiratory: CTA bilaterally, no wheezing, no crackles, no rhonchi Abdomen: Soft/+BS, non tender, non distended, no guarding Extremities: 1 + LE edema, No lymphangitis, No petechiae, No rashes, no synovitis   Data Reviewed: I have  personally reviewed following labs and imaging studies Basic Metabolic Panel: Recent Labs  Lab 09/24/24 0624 09/25/24 0501 09/26/24 0502 09/27/24 0417 09/28/24 0411  NA 140 141 140 140 139  K 3.4* 3.6 3.6 3.6 3.9  CL 98 97* 97* 99 99  CO2 39* 38* 37* 36* 36*  GLUCOSE 145* 122* 116* 142* 152*  BUN 21 18 17 18 17   CREATININE 1.21 1.07 1.13 1.13 1.11  CALCIUM  8.5* 8.6* 8.9 8.5* 8.5*  MG 2.4 2.0 2.0 2.1 2.1   Liver Function Tests: No results for input(s): AST, ALT, ALKPHOS, BILITOT, PROT, ALBUMIN in the last 168 hours. No results for input(s): LIPASE, AMYLASE in the last 168 hours. No results for input(s): AMMONIA in the last 168 hours. Coagulation  Profile: No results for input(s): INR, PROTIME in the last 168 hours. CBC: Recent Labs  Lab 09/22/24 0448 09/23/24 0518  WBC 10.2 11.2*  NEUTROABS 7.9* 8.6*  HGB 11.4* 11.1*  HCT 39.8 39.5  MCV 82.7 84.2  PLT 308 363   Cardiac Enzymes: No results for input(s): CKTOTAL, CKMB, CKMBINDEX, TROPONINI in the last 168 hours. BNP: Invalid input(s): POCBNP CBG: Recent Labs  Lab 09/27/24 2013 09/28/24 0325 09/28/24 0740 09/28/24 1106 09/28/24 1629  GLUCAP 257* 170* 105* 167* 138*   HbA1C: No results for input(s): HGBA1C in the last 72 hours. Urine analysis:    Component Value Date/Time   COLORURINE YELLOW 09/19/2024 1124   APPEARANCEUR CLEAR 09/19/2024 1124   APPEARANCEUR Clear 04/09/2024 1623   LABSPEC 1.021 09/19/2024 1124   PHURINE 5.0 09/19/2024 1124   GLUCOSEU >=500 (A) 09/19/2024 1124   HGBUR MODERATE (A) 09/19/2024 1124   BILIRUBINUR NEGATIVE 09/19/2024 1124   BILIRUBINUR Negative 04/09/2024 1623   KETONESUR 5 (A) 09/19/2024 1124   PROTEINUR 30 (A) 09/19/2024 1124   NITRITE NEGATIVE 09/19/2024 1124   LEUKOCYTESUR NEGATIVE 09/19/2024 1124   Sepsis Labs: @LABRCNTIP (procalcitonin:4,lacticidven:4) ) Recent Results (from the past 240 hours)  Blood culture (routine x 2)      Status: Abnormal   Collection Time: 09/19/24 12:51 PM   Specimen: BLOOD RIGHT ARM  Result Value Ref Range Status   Specimen Description   Final    BLOOD RIGHT ARM Performed at [facility], [address] [place], [place] [address]    Special Requests   Final    Blood Culture adequate volume Performed at [facility], [address], [place], [place] [address]    Culture  Setup Time   Final    ANAEROBIC BOTTLE ONLY GRAM POSITIVE RODS Gram Stain Report Called to,Read Back By and Verified With: JAYSON ISLAND AT 1556 ON 12.11.25 BY ADGER J  Performed at [facility], [address], [place], [place] [address]    Culture (A)  Final    CUTIBACTERIUM ACNES Standardized susceptibility testing for this organism is not available. Performed at [facility], [address], [place], [place] [address]    Report Status 09/26/2024 FINAL  Final  Blood culture (routine x 2)     Status: None   Collection Time: 09/19/24 12:56 PM   Specimen: Left Antecubital; Blood  Result Value Ref Range Status   Specimen Description LEFT ANTECUBITAL  Final   Special Requests Blood Culture adequate volume  Final   Culture   Final    NO GROWTH 5 DAYS Performed at [facility], [address] [place], [place] [address]    Report Status 09/24/2024 FINAL  Final  MRSA Next Gen by PCR, Nasal     Status: None   Collection Time: 09/19/24  2:05 PM   Specimen: Nasal Mucosa; Nasal Swab  Result Value Ref Range Status   MRSA by PCR Next Gen NOT DETECTED NOT DETECTED Final    Comment: (NOTE) The GeneXpert MRSA Assay (FDA approved for NASAL specimens only), is one component of a comprehensive MRSA colonization surveillance program. It is not intended to diagnose MRSA infection nor to guide or monitor treatment for MRSA infections. Test performance is not FDA approved in patients less than 67 years old. Performed at [facility], [address] [place], [place] [address]      Scheduled Meds:  acetaminophen   650 mg  Oral Q6H   Or   acetaminophen   650 mg Rectal Q6H  aspirin   81 mg Oral Daily   brimonidine   1 drop Both Eyes BID   And   timolol   1 drop Both Eyes BID   Chlorhexidine  Gluconate Cloth  6 each Topical Daily   colchicine   0.6 mg Oral Daily   dapagliflozin  propanediol  10 mg Oral Daily   [START ON 09/29/2024] diltiazem   120 mg Oral Daily   dorzolamide   1 drop Both Eyes BID   enoxaparin  (LOVENOX ) injection  40 mg Subcutaneous Q24H   gabapentin   100 mg Oral QHS   insulin  aspart  0-9 Units Subcutaneous TID WC   insulin  aspart  2 Units Subcutaneous TID WC   insulin  glargine  30 Units Subcutaneous QHS   liver oil-zinc  oxide   Topical BID   pantoprazole   40 mg Oral QPM   potassium chloride   40 mEq Oral BID   torsemide   20 mg Oral QPM   [START ON 09/29/2024] torsemide   40 mg Oral q AM   Continuous Infusions:  Procedures/Studies: ECHOCARDIOGRAM COMPLETE Result Date: 09/27/2024    ECHOCARDIOGRAM REPORT   Patient Name:   ZACKARIAH VANDERPOL Date of Exam: 09/27/2024 Medical Rec #:  [id number]     Height:       70.0 in Accession #:    [id number]    Weight:       205.5 lb Date of Birth:  22-May-1941      BSA:          2.111 m Patient Age:    83 years      BP:           90/60 mmHg Patient Gender: M             HR:           85 bpm. Exam Location:  Inpatient Procedure: 2D Echo (Both Spectral and Color Flow Doppler were utilized during            procedure). Indications:    acute diastolic chf. Bacteremia.  History:        Patient has prior history of Echocardiogram examinations, most                 recent 02/11/2024. CHF, chronic kidney disease, Arrythmias:Atrial                 Fibrillation; Risk Factors:Sleep Apnea, Hypertension and                 Dyslipidemia.  Sonographer:    Tinnie Barefoot RDCS Referring Phys: [phone] Jilliam Bellmore IMPRESSIONS  1. Left ventricular ejection fraction, by estimation, is 40 to 45%. Left ventricular ejection fraction by 2D MOD biplane is 44.8 %. The left ventricle has mildly decreased  function. The left ventricle demonstrates global hypokinesis. There is mild left ventricular hypertrophy. Left ventricular diastolic function could not be evaluated.  2. Right ventricular systolic function is mildly reduced. The right ventricular size is mildly enlarged. There is severely elevated pulmonary artery systolic pressure. The estimated right ventricular systolic pressure is 73.1 mmHg.  3. Left atrial size was moderately dilated.  4. Right atrial size was mildly dilated.  5. The mitral valve was not well visualized. Moderate mitral valve regurgitation.  6. Tricuspid valve regurgitation is mild to moderate.  7. The aortic valve is tricuspid. Aortic valve regurgitation is not visualized. Aortic valve sclerosis/calcification is present, without any evidence of aortic stenosis.  8. The inferior vena cava is dilated in size with <50% respiratory variability, suggesting right atrial pressure  of 15 mmHg.  9. Rhythm strip during this exam demonstrates atrial fibrillation. Comparison(s): Changes from prior study are noted. 02/11/2024: LVEF 55-60%. FINDINGS  Left Ventricle: Left ventricular ejection fraction, by estimation, is 40 to 45%. Left ventricular ejection fraction by 2D MOD biplane is 44.8 %. The left ventricle has mildly decreased function. The left ventricle demonstrates global hypokinesis. The left ventricular internal cavity size was normal in size. There is mild left ventricular hypertrophy. Left ventricular diastolic function could not be evaluated due to atrial fibrillation. Left ventricular diastolic function could not be evaluated. Right Ventricle: The right ventricular size is mildly enlarged. No increase in right ventricular wall thickness. Right ventricular systolic function is mildly reduced. There is severely elevated pulmonary artery systolic pressure. The tricuspid regurgitant velocity is 3.81 m/s, and with an assumed right atrial pressure of 15 mmHg, the estimated right ventricular systolic  pressure is 73.1 mmHg. Left Atrium: Left atrial size was moderately dilated. Right Atrium: Right atrial size was mildly dilated. Pericardium: There is no evidence of pericardial effusion. Mitral Valve: The mitral valve was not well visualized. Moderate mitral valve regurgitation. Tricuspid Valve: The tricuspid valve is grossly normal. Tricuspid valve regurgitation is mild to moderate. Aortic Valve: The aortic valve is tricuspid. Aortic valve regurgitation is not visualized. Aortic valve sclerosis/calcification is present, without any evidence of aortic stenosis. Pulmonic Valve: The pulmonic valve was grossly normal. Pulmonic valve regurgitation is trivial. Aorta: The aortic root and ascending aorta are structurally normal, with no evidence of dilitation. Venous: The inferior vena cava is dilated in size with less than 50% respiratory variability, suggesting right atrial pressure of 15 mmHg. IAS/Shunts: No atrial level shunt detected by color flow Doppler. EKG: Rhythm strip during this exam demonstrates atrial fibrillation.  LEFT VENTRICLE PLAX 2D                        Biplane EF (MOD) LVIDd:         5.90 cm         LV Biplane EF:   Left LVIDs:         4.65 cm                          ventricular LV PW:         1.20 cm                          ejection LV IVS:        1.40 cm                          fraction by LVOT diam:     2.50 cm                          2D MOD LV SV:         55                               biplane is LV SV Index:   26                               44.8 %. LVOT Area:     4.91 cm  LV Volumes (MOD) LV vol d,  MOD    106.0 ml A2C: LV vol d, MOD    137.0 ml A4C: LV vol s, MOD    58.4 ml A2C: LV vol s, MOD    77.8 ml A4C: LV SV MOD A2C:   47.6 ml LV SV MOD A4C:   137.0 ml LV SV MOD BP:    55.1 ml RIGHT VENTRICLE          IVC RV Basal diam:  3.90 cm  IVC diam: 2.30 cm LEFT ATRIUM             Index        RIGHT ATRIUM           Index LA diam:        4.30 cm 2.04 cm/m   RA Area:     20.80 cm LA Vol  (A2C):   94.5 ml 44.76 ml/m  RA Volume:   61.30 ml  29.03 ml/m LA Vol (A4C):   90.6 ml 42.91 ml/m LA Biplane Vol: 94.6 ml 44.80 ml/m  AORTIC VALVE LVOT Vmax:   66.88 cm/s LVOT Vmean:  40.825 cm/s LVOT VTI:    0.112 m  AORTA Ao Root diam: 3.40 cm MR Peak grad: 75.7 mmHg   TRICUSPID VALVE MR Mean grad: 60.0 mmHg   TR Peak grad:   58.1 mmHg MR Vmax:      435.00 cm/s TR Vmax:        381.00 cm/s MR Vmean:     369.0 cm/s                           SHUNTS                           Systemic VTI:  0.11 m                           Systemic Diam: 2.50 cm Vinie Maxcy MD Electronically signed by Vinie Maxcy MD Signature Date/Time: 09/27/2024/4:25:22 PM    Final    MR BRAIN WO CONTRAST Result Date: 09/20/2024 EXAM: MRI BRAIN WITHOUT CONTRAST 09/20/2024 02:20:10 PM TECHNIQUE: Multiplanar multisequence MRI of the head/brain was performed without the administration of intravenous contrast. COMPARISON: CT head 10/18/2018 CLINICAL HISTORY: Neuro deficit, acute, stroke suspected. Generalized weakness, rule out acute Cerebrovascular Accident. FINDINGS: BRAIN AND VENTRICLES: No acute infarct. No intracranial hemorrhage. No mass. No midline shift. No hydrocephalus. Mild for age chronic microvascular ischemic change. A few small remote left frontal cortical infarcts. Small remote right cerebellar infarct. Normal flow voids. ORBITS: No acute abnormality. SINUSES AND MASTOIDS: No acute abnormality. BONES AND SOFT TISSUES: Normal marrow signal. No acute soft tissue abnormality. IMPRESSION: 1. No acute intracranial abnormality. Electronically signed by: Gilmore Molt MD 09/20/2024 03:15 PM EST RP Workstation: [id number]   DG CHEST PORT 1 VIEW Result Date: 09/20/2024 EXAM: 1 VIEW(S) XRAY OF THE CHEST 09/20/2024 05:29:00 AM COMPARISON: 09/19/2024 CLINICAL HISTORY: 08790 Atelectasis 91209; 10031 Cough 10031 FINDINGS: LUNGS AND PLEURA: Low lung volumes. Possible left retrocardiac opacity, favored to reflect atelectasis, but  difficult to exclude superimposed airspace opacity. Decreased left pleural effusion. Trace right pleural effusion. No pneumothorax. HEART AND MEDIASTINUM: Cardiomegaly. Atherosclerotic calcifications. BONES AND SOFT TISSUES: No acute osseous abnormality. IMPRESSION: 1. Decreased left pleural effusion and trace right pleural effusion. 2. Possible left retrocardiac opacity, favored to reflect atelectasis, but difficult to exclude superimposed airspace opacity. 3.  Cardiomegaly and atherosclerotic calcifications. Electronically signed by: Evalene Coho MD 09/20/2024 06:05 AM EST RP Workstation: [id number]   MR LUMBAR SPINE WO CONTRAST Result Date: 09/19/2024 EXAM: MRI LUMBAR SPINE 09/19/2024 04:34:40 PM TECHNIQUE: Multiplanar multisequence MRI of the lumbar spine was performed without the administration of intravenous contrast. COMPARISON: 04/08/2020 CLINICAL HISTORY: Lumbar radiculopathy, symptoms persist with > 6 wks treatment; Myelopathy, acute, lumbar spine. FINDINGS: BONES AND ALIGNMENT: Normal alignment except for slight retrolisthesis at L2-L3 and L3-L4, which is stable. Normal vertebral body heights. Bone marrow signal is unremarkable except for extensive type 2 Modic changes at L2-L3 and L3-L4, and edematous changes that have progressed at L4-L5 and L5-S1. SPINAL CORD: The conus medullaris terminates at L1. SOFT TISSUES: No paraspinal mass. L1-L2: No significant disc herniation. No spinal canal stenosis or neural foraminal narrowing. L2-L3: Uncovertebral broad-based disc protrusion results in stable mild foraminal narrowing bilaterally. A broad-based disc protrusion and prominent epidural fat results in moderate central stenosis. Severe left and moderate right foraminal narrowing demonstrate some progression since the prior study. L3-L4: Broad-based disc protrusion has progressed. Moderate facet hypertrophy has progressed bilaterally. 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 Progressive  moderate foraminal stenosis is present bilaterally, right greater than left. L4-L5: Chronic positive disc height is present. Endplate ridging and epidural lipomatosis result in moderate central canal stenosis. Moderate-to-severe right and moderate left foraminal stenosis is stable. L5-S1: Edematous changes have progressed. No significant disc herniation. No spinal canal stenosis or neural foraminal narrowing. IMPRESSION: 1. 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 2. Moderate central canal stenosis at L2-3 from broad-based disc protrusion and prominent epidural fat. 3. Progressive moderate foraminal stenosis bilaterally at L4-5, right greater than left. 4. Severe left and moderate right foraminal narrowing at L2-3 with some progression since prior study. 5. Progression of edematous changes at L4-5 and L5-S1. 6. Progression of broad-based disc protrusion and moderate facet hypertrophy bilaterally at L4-5. Electronically signed by: Lonni Necessary MD 09/19/2024 06:28 PM EST RP Workstation: [id number]   DG Chest Port 1 View Result Date: 09/19/2024 EXAM: 1 VIEW(S) XRAY OF THE CHEST 09/19/2024 11:45:00 AM COMPARISON: 01/20/2024 CLINICAL HISTORY: shortness of breath FINDINGS: LUNGS AND PLEURA: Probable left retrocardiac opacity concerning for atelectasis or infiltrate. Possible effusion. No pneumothorax. HEART AND MEDIASTINUM: Stable cardiomegaly. No acute abnormality of the mediastinal silhouette. BONES AND SOFT TISSUES: No acute osseous abnormality. IMPRESSION: 1. Left retrocardiac opacity suspicious for atelectasis or infiltrate, with possible effusion. 2. Stable cardiomegaly. Electronically signed by: Lynwood Seip MD 09/19/2024 12:16 PM EST RP Workstation: [id number]   DG Knee Complete 4 Views Right Result Date: 09/19/2024 EXAM: 4 OR MORE VIEW(S) XRAY OF THE RIGHT KNEE 09/19/2024 11:45:00 AM  COMPARISON: None available. CLINICAL HISTORY: shortness of breath FINDINGS: BONES AND JOINTS: No acute fracture. No malalignment. Small suprapatellar joint effusion is noted. Minimal narrowing of lateral joint space is noted. SOFT TISSUES: The soft tissues are unremarkable. IMPRESSION: 1. Small suprapatellar joint effusion. 2. Minimal lateral compartment joint space narrowing. Electronically signed by: Lynwood Seip MD 09/19/2024 12:14 PM EST RP Workstation: [id number]   DG Pelvis 1-2 Views Result Date: 09/19/2024 EXAM: 1 or 2 view(s) Xray of the pelvis 09/19/2024 11:45:00 AM COMPARISON: None available. CLINICAL HISTORY: Shortness of breath FINDINGS: BONES AND JOINTS: No acute fracture. No malalignment. SOFT TISSUES: The soft tissues are unremarkable. IMPRESSION: 1. No significant abnormality. Electronically signed by: Lynwood Seip MD 09/19/2024 12:13 PM EST RP Workstation: [id number]  Intravitreal Injection, Pharmacologic Agent - OD - Right Eye Result Date: 09/04/2024 Time Out 09/04/2024. 2:37 PM. Confirmed correct patient, procedure, site, and patient consented. Anesthesia Topical anesthesia was used. Anesthetic medications included Lidocaine  2%, Proparacaine 0.5%. Procedure Preparation included 5% betadine to ocular surface, eyelid speculum. A supplied needle was used. Injection: 6 mg faricimab -svoa 6 MG/0.05ML (Patient supplied)   Route: Intravitreal, Site: Right Eye   NDC: 49757-903-93, Lot: [id number], Expiration date: 07/14/2025, Waste: 0 mL Post-op Post injection exam found visual acuity of at least counting fingers. The patient tolerated the procedure well. There were no complications. The patient received written and verbal post procedure care education. Post injection medications were not given. Notes **PAP MEDICATION ADMINISTERED**   Intravitreal Injection, Pharmacologic Agent - OS - Left Eye Result Date: 09/04/2024 Time Out 09/04/2024. 2:37 PM. Confirmed correct patient, procedure, site, and  patient consented. Anesthesia Topical anesthesia was used. Anesthetic medications included Lidocaine  2%, Proparacaine 0.5%. Procedure Preparation included 5% betadine to ocular surface, eyelid speculum. A supplied (32g) needle was used. Injection: 6 mg faricimab -svoa 6 MG/0.05ML (Patient supplied)   Route: Intravitreal, Site: Left Eye   NDC: 49757-903-93, Lot: [id number], Expiration date: 07/14/2025, Waste: 0 mL Post-op Post injection exam found visual acuity of at least counting fingers. The patient tolerated the procedure well. There were no complications. The patient received written and verbal post procedure care education. Post injection medications were not given. Notes **PAP MEDICATION ADMINISTERED**  OCT, Retina - OU - Both Eyes Result Date: 09/04/2024 Right Eye Quality was good. Central Foveal Thickness: 269. Progression has improved. Findings include normal foveal contour, no SRF, intraretinal fluid, vitreomacular adhesion (Interval resolution of trace cystic changes ST fovea, partial PVD). Left Eye Quality was good. Central Foveal Thickness: 269. Progression has been stable. Findings include normal foveal contour, no IRF, no SRF, intraretinal hyper-reflective material (persistent cystic changes and punctate IRHM, partial PVD). Notes *Images captured and stored on drive Diagnosis / Impression: +DME OU OD: Interval resolution of trace cystic changes ST fovea,  partial PVD OS: persistent cystic changes and punctate IRHM, partial PVD Clinical management: See below Abbreviations: NFP - Normal foveal profile. CME - cystoid macular edema. PED - pigment epithelial detachment. IRF - intraretinal fluid. SRF - subretinal fluid. EZ - ellipsoid zone. ERM - epiretinal membrane. ORA - outer retinal atrophy. ORT - outer retinal tubulation. SRHM - subretinal hyper-reflective material. IRHM - intraretinal hyper-reflective material   Alm Schneider, DO  [facility]  If 7PM-7AM, please contact  night-coverage [email or web address] Password [email or web address] 09/28/2024, 6:04 PM   LOS: 9 days

## 2024-09-29 ENCOUNTER — Inpatient Hospital Stay

## 2024-09-29 LAB — BASIC METABOLIC PANEL WITH GFR
Anion gap: 4 — ABNORMAL LOW (ref 5–15)
BUN: 16 mg/dL (ref 8–23)
CO2: 35 mmol/L — ABNORMAL HIGH (ref 22–32)
Calcium: 8.5 mg/dL — ABNORMAL LOW (ref 8.9–10.3)
Chloride: 100 mmol/L (ref 98–111)
Creatinine, Ser: 1.08 mg/dL (ref 0.61–1.24)
GFR, Estimated: >60 mL/min (ref >60)
Glucose, Bld: 154 mg/dL — ABNORMAL HIGH (ref 70–99)
Potassium: 3.9 mmol/L (ref 3.5–5.1)
Sodium: 139 mmol/L (ref 135–145)

## 2024-09-29 LAB — GLUCOSE, CAPILLARY
Glucose-Capillary: 111 mg/dL — ABNORMAL HIGH (ref 70–99)
Glucose-Capillary: 135 mg/dL — ABNORMAL HIGH (ref 70–99)
Glucose-Capillary: 132 mg/dL — ABNORMAL HIGH (ref 70–99)

## 2024-09-29 MED ORDER — DILTIAZEM HCL ER COATED BEADS 120 MG PO CP24: 120.0000 mg | ORAL_CAPSULE | Freq: Every day | ORAL | Status: DC

## 2024-09-29 NOTE — TOC Transition Note (Signed)
 Transition of Care [facility]) - Discharge Note   Patient Details  Name: Lucas Dudley MRN: [id number] Date of Birth: [birth year]  [facility]) CM/SW Contact:  Sharlyne Stabs, RN Phone Number: 09/29/2024, 11:03 AM   Clinical Narrative:  YVONNA confirmed INS AUTH approved. Clinicals sent to [facility],  Kristin provided room number, RN calling report. EMS scheduled. IPCM called his daughter to update her with discharge plan.     Final next level of care: Skilled Nursing Facility Barriers to Discharge: Barriers Resolved   Patient Goals and CMS Choice Patient states their goals for this hospitalization and ongoing recovery are:: Agreeable to SNF   Choice offered to / list presented to : Patient     Discharge Placement          Name of family member notified: Daughter Patient and family notified of of transfer: 09/29/24  Discharge Plan and Services Additional resources added to the After Visit Summary for       Post Acute Care Choice: Home Health                Social Drivers of Health (SDOH) Interventions SDOH Screenings   Food Insecurity: No Food Insecurity (09/19/2024)  Housing: Low Risk (09/19/2024)  Transportation Needs: No Transportation Needs (09/19/2024)  Utilities: Not At Risk (09/19/2024)  Alcohol Screen: Low Risk (01/27/2024)  Depression (PHQ2-9): Low Risk (09/16/2024)  Financial Resource Strain: Low Risk (02/10/2024)  Physical Activity: Inactive (02/10/2024)  Social Connections: Moderately Integrated (09/19/2024)  Stress: No Stress Concern Present (02/10/2024)  Tobacco Use: Medium Risk (09/20/2024)  Health Literacy: Adequate Health Literacy (11/20/2023)     Readmission Risk Interventions    09/25/2024   10:46 AM 09/24/2024    3:40 PM 01/28/2024    1:28 PM  Readmission Risk Prevention Plan  Transportation Screening Complete Complete Complete  Medication Review [employer]) Complete  Referral to Pharmacy  PCP or Specialist appointment within 3-5  days of discharge  Complete Complete  HRI or Home Care Consult Complete Complete Complete  SW Recovery Care/Counseling Consult Complete Complete Complete  Palliative Care Screening Not Applicable Not Applicable Not Applicable  Skilled Nursing Facility Complete Complete Not Applicable

## 2024-09-29 NOTE — Care Management Important Message (Signed)
 Important Message  Patient Details  Name: Lucas Dudley MRN: [id number] Date of Birth: 07-28-1941   Important Message Given:  Yes - Medicare IM     Jaden Abreu L Ares Cardozo 09/29/2024, 11:19 AM

## 2024-09-29 NOTE — Discharge Summary (Signed)
 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  He apparently had a fall about 4 days ago at home.  He did not seek medical treatment at the time.  He fell again 2 days ago and was not able to get up and had been on the floor for about 1-2 days.  He has been having difficulty for past few months with lower extremity weakness and his legs giving out.  They have not been able to determine the cause of this.  His daughter called EMS to check on him because she had not heard from him in a couple of days.  EMS found him to have right knee swelling.  He complained of shortness of breath and knee pain.  He has not had his medications in a couple of days.  The ED found him to be tachycardic in A-fib with RVR and was hypoxic and placed on 3 L nasal cannula.  His chest x-ray was suspicious for atelectasis and/or pneumonia.  He was started on IV diltiazem  infusion, IV antibiotics, Lasix , IV fluids.  He was noted to be tachypneic and had normal venous pH.  His blood sugar was 239.  His CK was 518.  Creatinine 1.44, GFR 48.5 mL/min and troponin 91.  White blood cell count 17.4, hemoglobin 11.9, platelets 271.  Bilirubin 2.2.  His trauma workup revealed normal pelvis imaging, right knee imaging reveals small suprapatellar joint effusion.   Admission requested for further management.  Assessment and Plan:  Paroxysmal Atrial fibrillation with RVR -- he was started on IV diltiazem  infusion in ED -- admitted to stepdown ICU --- transfer to telemetry bed  -- weaned off diltiazem  infusion to oral Cardizem  30 mg every 6 hours  -- he is s/p watchman device as he could not tolerate full anticoagulation -- he previously had controlled rate Afib without AV nodal agent per cardiology notes PTA - continue short acting diltiazem >>convert to long acting - now rate controlled    Acute on Chronic HFpEF -- ReDS vest 40% on 12/9 -- lasix  to 80 mg IV bid during hospitalization -- appreciate cardiology - down to 228>>>213 lbs - I/O incomplete, but NEG 13L - 12/15--transition to po torsemide  40 mg AM, 20 PM   Bacteremia -Cutibacterium acnes 1/2 sets -likely contaminant -12/14 Echo EF 40-45%, global HK, mild reduced RVF, RVSP 73.1   Lobar  pneumonia Sepsis ruled out  -- 12/7 CXR with LLL opacity -- IV antibiotics--finished 5 days doxy/ceftriaxone    Generalized weakness Adult failure to thrive  Lumbar radiculopathy with leg weakness -- discussed with daughter symptoms of leg weakness progressing and requested MRI L spine -- if patient can tolerate will try to obtain MRI L spine without contrast  -- MRI with findings of 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  Will obtain PT evaluation.  MRI brain ordered to rule out acute CVA -- MRI brain  negative for CVA -- Pt recommending SNF and patient is agreeable.   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 -- continue semglee  30 units and novolog  2 units with meals -- SSI coverage and frequent CBG monitoring ordered -- 09/19/24 A1c 11.1%    Acute urinary retention -- I/O caths done in ED -- temporary foley cath placement ordered -- removed foley 12/8 and he has been voiding without difficulty   Essential hypertension  -- weaned off IV diltiazem  infusion to oral diltiazem  30 mg  every 6 hours   -now transitioned to long acting diltiazem    Hyperlipidemia -- he is reportedly intolerent to statins  -- cardiology had started him on zetia  daily previously but he now says he doesn't take it    Stage 3a CKD -- baseline creatinine 1.1-1.4 - monitor with diuresis - serum creatinine 1.08 of day of dc   OSA on CPAP -- nightly CPAP ordered   Iron  deficiency anemia -- he is followed by hematology  -- he is no longer on octreotide  injections -- he is receiving IV iron  infusions.  He received IV iron  12/8 and 12/15 - iron  saturation 7, ferritin 84 - restart po iron    CAD -- recent cath demonstrated 40% proximal to mid RCA stenosis and 60% PDA stenosis managed with medical therapy by cardiology.  -- he is on aspirin  81 mg daily  - no chest pain   Obesity -BMI 32.49 -lifestyle modification   Hypokalemia -repleted -mag - 2.0   Acute gout  -started colchicine  which he takes at home -monitor renal function on colchicine        Consultants: cardiology Procedures performed: none  Disposition: Skilled nursing facility Diet recommendation:  Cardiac and Carb modified diet DISCHARGE MEDICATION: Allergies as of 09/29/2024       Reactions   Silicone Dermatitis   Other Dermatitis, Other (See Comments)   Adhesive agent (substance)   Azithromycin  Nausea Only   Tape Rash, Other (See Comments)   Causes skin redness, Use paper tape only.        Medication List     STOP taking these medications    diphenhydramine -acetaminophen  25-500 MG Tabs tablet Commonly known as: TYLENOL  PM   Fish Oil Extra Strength 1200 MG Caps   gemfibrozil  600 MG tablet Commonly known as: LOPID    glimepiride  2 MG tablet Commonly known as: AMARYL        TAKE these medications    albuterol  108 (90 Base) MCG/ACT inhaler Commonly known as: VENTOLIN  HFA Inhale 2 puffs into the lungs every 6 (six) hours as needed for wheezing or shortness of breath. What changed: when to take  this   ammonium lactate  12 % lotion Commonly known as: Amlactin Daily Apply 1 Application topically as needed.   Anti-Itch lotion Generic drug: camphor-menthol Apply 1 Application topically daily as needed for itching.   aspirin  81 MG chewable tablet Chew 81 mg by mouth daily.   Basaglar  KwikPen 100 UNIT/ML INJECT 45 UNITS INTO THE SKIN AT BEDTIME. What changed: how much to take   colchicine  0.6 MG tablet TAKE 2 TABLETS ON DAY 1, FOLLOWED BY 1 TABLET 1 HOUR LATER. PLEASE TAKE 1 TABLET ONCE DAILY FROM DAY 2 TILL SWELLING IMPROVES. What changed: See the new instructions.   Combigan  0.2-0.5 % ophthalmic solution Generic drug: brimonidine -timolol  Place 1 drop into both eyes 2 (two) times daily.   dapagliflozin  propanediol 10 MG Tabs tablet Commonly known as: FARXIGA  Take 1 tablet (10 mg total) by mouth daily.  diltiazem  120 MG 24 hr capsule Commonly known as: CARDIZEM  CD Take 1 capsule (120 mg total) by mouth daily. Start taking on: September 30, 2024   dorzolamide  2 % ophthalmic solution Commonly known as: TRUSOPT  1 drop 2 (two) times daily.   ELDERBERRY PO Take 2 tablets by mouth daily. Chewable   ezetimibe  10 MG tablet Commonly known as: ZETIA  Take 1 tablet (10 mg total) by mouth daily.   ferrous sulfate  325 (65 FE) MG tablet Take 1 tablet (325 mg total) by mouth daily with breakfast.   fluticasone  50 MCG/ACT nasal spray Commonly known as: FLONASE  Place 1 spray into both nostrils daily. SPRAY 2 SPRAYS INTO EACH NOSTRIL EVERY DAY   gabapentin  300 MG capsule Commonly known as: NEURONTIN  Take 1 capsule (300 mg total) by mouth 3 (three) times daily. What changed: when to take this   magnesium  gluconate 500 MG tablet Commonly known as: MAGONATE Take 500 mg by mouth daily.   mineral oil liquid Take 15 mLs by mouth daily as needed for moderate constipation.   OVER THE COUNTER MEDICATION Take 1 tablet by mouth daily. Beets Chew   pantoprazole  40 MG  tablet Commonly known as: PROTONIX  TAKE 1 TABLET BY MOUTH EVERY DAY   torsemide  20 MG tablet Commonly known as: DEMADEX  40 MG IN THE MORNING AND 20 MG IN THE EVENING What changed: See the new instructions.   vitamin E 45 MG (100 UNITS) capsule Take 100 Units by mouth daily.   zinc  gluconate 50 MG tablet Take 50 mg by mouth daily.        Contact information for after-discharge care     Destination     [facility] .   Service: Skilled Nursing Contact information: [address] [place]  [address] [phone]                    Discharge Exam: Filed Weights   09/28/24 0500 09/28/24 1045 09/29/24 0553  Weight: 97.5 kg 97 kg 98.3 kg   HEENT:  [place]/AT, No thrush, no icterus CV:  RRR, no rub, no S3, no S4 Lung:  CTA, no wheeze, no rhonchi Abd:  soft/+BS, NT Ext:  1 + LE edema, no lymphangitis, no synovitis, no rash   Condition at discharge: stable  The results of significant diagnostics from this hospitalization (including imaging, microbiology, ancillary and laboratory) are listed below for reference.   Imaging Studies: ECHOCARDIOGRAM COMPLETE Result Date: 09/27/2024    ECHOCARDIOGRAM REPORT   Patient Name:   KENSLEY LARES Date of Exam: 09/27/2024 Medical Rec #:  [id number]     Height:       70.0 in Accession #:    [id number]    Weight:       205.5 lb Date of Birth:  09-18-1941      BSA:          2.111 m Patient Age:    83 years      BP:           90/60 mmHg Patient Gender: M             HR:           85 bpm. Exam Location:  Inpatient Procedure: 2D Echo (Both Spectral and Color Flow Doppler were utilized during            procedure). Indications:    acute diastolic chf. Bacteremia.  History:  Patient has prior history of Echocardiogram examinations, most                 recent 02/11/2024. CHF, chronic kidney disease, Arrythmias:Atrial                 Fibrillation; Risk Factors:Sleep Apnea, Hypertension and                  Dyslipidemia.  Sonographer:    Tinnie Barefoot RDCS Referring Phys: [phone] Jance Siek IMPRESSIONS  1. Left ventricular ejection fraction, by estimation, is 40 to 45%. Left ventricular ejection fraction by 2D MOD biplane is 44.8 %. The left ventricle has mildly decreased function. The left ventricle demonstrates global hypokinesis. There is mild left ventricular hypertrophy. Left ventricular diastolic function could not be evaluated.  2. Right ventricular systolic function is mildly reduced. The right ventricular size is mildly enlarged. There is severely elevated pulmonary artery systolic pressure. The estimated right ventricular systolic pressure is 73.1 mmHg.  3. Left atrial size was moderately dilated.  4. Right atrial size was mildly dilated.  5. The mitral valve was not well visualized. Moderate mitral valve regurgitation.  6. Tricuspid valve regurgitation is mild to moderate.  7. The aortic valve is tricuspid. Aortic valve regurgitation is not visualized. Aortic valve sclerosis/calcification is present, without any evidence of aortic stenosis.  8. The inferior vena cava is dilated in size with <50% respiratory variability, suggesting right atrial pressure of 15 mmHg.  9. Rhythm strip during this exam demonstrates atrial fibrillation. Comparison(s): Changes from prior study are noted. 02/11/2024: LVEF 55-60%. FINDINGS  Left Ventricle: Left ventricular ejection fraction, by estimation, is 40 to 45%. Left ventricular ejection fraction by 2D MOD biplane is 44.8 %. The left ventricle has mildly decreased function. The left ventricle demonstrates global hypokinesis. The left ventricular internal cavity size was normal in size. There is mild left ventricular hypertrophy. Left ventricular diastolic function could not be evaluated due to atrial fibrillation. Left ventricular diastolic function could not be evaluated. Right Ventricle: The right ventricular size is mildly enlarged. No increase in right  ventricular wall thickness. Right ventricular systolic function is mildly reduced. There is severely elevated pulmonary artery systolic pressure. The tricuspid regurgitant velocity is 3.81 m/s, and with an assumed right atrial pressure of 15 mmHg, the estimated right ventricular systolic pressure is 73.1 mmHg. Left Atrium: Left atrial size was moderately dilated. Right Atrium: Right atrial size was mildly dilated. Pericardium: There is no evidence of pericardial effusion. Mitral Valve: The mitral valve was not well visualized. Moderate mitral valve regurgitation. Tricuspid Valve: The tricuspid valve is grossly normal. Tricuspid valve regurgitation is mild to moderate. Aortic Valve: The aortic valve is tricuspid. Aortic valve regurgitation is not visualized. Aortic valve sclerosis/calcification is present, without any evidence of aortic stenosis. Pulmonic Valve: The pulmonic valve was grossly normal. Pulmonic valve regurgitation is trivial. Aorta: The aortic root and ascending aorta are structurally normal, with no evidence of dilitation. Venous: The inferior vena cava is dilated in size with less than 50% respiratory variability, suggesting right atrial pressure of 15 mmHg. IAS/Shunts: No atrial level shunt detected by color flow Doppler. EKG: Rhythm strip during this exam demonstrates atrial fibrillation.  LEFT VENTRICLE PLAX 2D                        Biplane EF (MOD) LVIDd:         5.90 cm         LV Biplane  EF:   Left LVIDs:         4.65 cm                          ventricular LV PW:         1.20 cm                          ejection LV IVS:        1.40 cm                          fraction by LVOT diam:     2.50 cm                          2D MOD LV SV:         55                               biplane is LV SV Index:   26                               44.8 %. LVOT Area:     4.91 cm  LV Volumes (MOD) LV vol d, MOD    106.0 ml A2C: LV vol d, MOD    137.0 ml A4C: LV vol s, MOD    58.4 ml A2C: LV vol s, MOD    77.8  ml A4C: LV SV MOD A2C:   47.6 ml LV SV MOD A4C:   137.0 ml LV SV MOD BP:    55.1 ml RIGHT VENTRICLE          IVC RV Basal diam:  3.90 cm  IVC diam: 2.30 cm LEFT ATRIUM             Index        RIGHT ATRIUM           Index LA diam:        4.30 cm 2.04 cm/m   RA Area:     20.80 cm LA Vol (A2C):   94.5 ml 44.76 ml/m  RA Volume:   61.30 ml  29.03 ml/m LA Vol (A4C):   90.6 ml 42.91 ml/m LA Biplane Vol: 94.6 ml 44.80 ml/m  AORTIC VALVE LVOT Vmax:   66.88 cm/s LVOT Vmean:  40.825 cm/s LVOT VTI:    0.112 m  AORTA Ao Root diam: 3.40 cm MR Peak grad: 75.7 mmHg   TRICUSPID VALVE MR Mean grad: 60.0 mmHg   TR Peak grad:   58.1 mmHg MR Vmax:      435.00 cm/s TR Vmax:        381.00 cm/s MR Vmean:     369.0 cm/s                           SHUNTS                           Systemic VTI:  0.11 m                           Systemic Diam: 2.50 cm Vinie Maxcy MD Electronically signed by  Vinie Maxcy MD Signature Date/Time: 09/27/2024/4:25:22 PM    Final    MR BRAIN WO CONTRAST Result Date: 09/20/2024 EXAM: MRI BRAIN WITHOUT CONTRAST 09/20/2024 02:20:10 PM TECHNIQUE: Multiplanar multisequence MRI of the head/brain was performed without the administration of intravenous contrast. COMPARISON: CT head 10/18/2018 CLINICAL HISTORY: Neuro deficit, acute, stroke suspected. Generalized weakness, rule out acute Cerebrovascular Accident. FINDINGS: BRAIN AND VENTRICLES: No acute infarct. No intracranial hemorrhage. No mass. No midline shift. No hydrocephalus. Mild for age chronic microvascular ischemic change. A few small remote left frontal cortical infarcts. Small remote right cerebellar infarct. Normal flow voids. ORBITS: No acute abnormality. SINUSES AND MASTOIDS: No acute abnormality. BONES AND SOFT TISSUES: Normal marrow signal. No acute soft tissue abnormality. IMPRESSION: 1. No acute intracranial abnormality. Electronically signed by: Gilmore Molt MD 09/20/2024 03:15 PM EST RP Workstation: [id number]   DG CHEST PORT 1  VIEW Result Date: 09/20/2024 EXAM: 1 VIEW(S) XRAY OF THE CHEST 09/20/2024 05:29:00 AM COMPARISON: 09/19/2024 CLINICAL HISTORY: 08790 Atelectasis 91209; 10031 Cough 10031 FINDINGS: LUNGS AND PLEURA: Low lung volumes. Possible left retrocardiac opacity, favored to reflect atelectasis, but difficult to exclude superimposed airspace opacity. Decreased left pleural effusion. Trace right pleural effusion. No pneumothorax. HEART AND MEDIASTINUM: Cardiomegaly. Atherosclerotic calcifications. BONES AND SOFT TISSUES: No acute osseous abnormality. IMPRESSION: 1. Decreased left pleural effusion and trace right pleural effusion. 2. Possible left retrocardiac opacity, favored to reflect atelectasis, but difficult to exclude superimposed airspace opacity. 3. Cardiomegaly and atherosclerotic calcifications. Electronically signed by: Evalene Coho MD 09/20/2024 06:05 AM EST RP Workstation: [id number]   MR LUMBAR SPINE WO CONTRAST Result Date: 09/19/2024 EXAM: MRI LUMBAR SPINE 09/19/2024 04:34:40 PM TECHNIQUE: Multiplanar multisequence MRI of the lumbar spine was performed without the administration of intravenous contrast. COMPARISON: 04/08/2020 CLINICAL HISTORY: Lumbar radiculopathy, symptoms persist with > 6 wks treatment; Myelopathy, acute, lumbar spine. FINDINGS: BONES AND ALIGNMENT: Normal alignment except for slight retrolisthesis at L2-L3 and L3-L4, which is stable. Normal vertebral body heights. Bone marrow signal is unremarkable except for extensive type 2 Modic changes at L2-L3 and L3-L4, and edematous changes that have progressed at L4-L5 and L5-S1. SPINAL CORD: The conus medullaris terminates at L1. SOFT TISSUES: No paraspinal mass. L1-L2: No significant disc herniation. No spinal canal stenosis or neural foraminal narrowing. L2-L3: Uncovertebral broad-based disc protrusion results in stable mild foraminal narrowing bilaterally. A broad-based disc protrusion and prominent epidural fat results in moderate central  stenosis. Severe left and moderate right foraminal narrowing demonstrate some progression since the prior study. L3-L4: Broad-based disc protrusion has progressed. Moderate facet hypertrophy has progressed bilaterally. 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 Progressive moderate foraminal stenosis is present bilaterally, right greater than left. L4-L5: Chronic positive disc height is present. Endplate ridging and epidural lipomatosis result in moderate central canal stenosis. Moderate-to-severe right and moderate left foraminal stenosis is stable. L5-S1: Edematous changes have progressed. No significant disc herniation. No spinal canal stenosis or neural foraminal narrowing. IMPRESSION: 1. 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 2. Moderate central canal stenosis at L2-3 from broad-based disc protrusion and prominent epidural fat. 3. Progressive moderate foraminal stenosis bilaterally at L4-5, right greater than left. 4. Severe left and moderate right foraminal narrowing at L2-3 with some progression since prior study. 5. Progression of edematous changes at L4-5 and L5-S1. 6. Progression of broad-based disc protrusion and moderate facet hypertrophy bilaterally at L4-5. Electronically signed by: Lonni Necessary MD 09/19/2024 06:28 PM EST  RP Workstation: [id number]   DG Chest Port 1 View Result Date: 09/19/2024 EXAM: 1 VIEW(S) XRAY OF THE CHEST 09/19/2024 11:45:00 AM COMPARISON: 01/20/2024 CLINICAL HISTORY: shortness of breath FINDINGS: LUNGS AND PLEURA: Probable left retrocardiac opacity concerning for atelectasis or infiltrate. Possible effusion. No pneumothorax. HEART AND MEDIASTINUM: Stable cardiomegaly. No acute abnormality of the mediastinal silhouette. BONES AND SOFT TISSUES: No acute osseous abnormality. IMPRESSION: 1. Left retrocardiac opacity suspicious for atelectasis or  infiltrate, with possible effusion. 2. Stable cardiomegaly. Electronically signed by: Lynwood Seip MD 09/19/2024 12:16 PM EST RP Workstation: [id number]   DG Knee Complete 4 Views Right Result Date: 09/19/2024 EXAM: 4 OR MORE VIEW(S) XRAY OF THE RIGHT KNEE 09/19/2024 11:45:00 AM COMPARISON: None available. CLINICAL HISTORY: shortness of breath FINDINGS: BONES AND JOINTS: No acute fracture. No malalignment. Small suprapatellar joint effusion is noted. Minimal narrowing of lateral joint space is noted. SOFT TISSUES: The soft tissues are unremarkable. IMPRESSION: 1. Small suprapatellar joint effusion. 2. Minimal lateral compartment joint space narrowing. Electronically signed by: Lynwood Seip MD 09/19/2024 12:14 PM EST RP Workstation: [id number]   DG Pelvis 1-2 Views Result Date: 09/19/2024 EXAM: 1 or 2 view(s) Xray of the pelvis 09/19/2024 11:45:00 AM COMPARISON: None available. CLINICAL HISTORY: Shortness of breath FINDINGS: BONES AND JOINTS: No acute fracture. No malalignment. SOFT TISSUES: The soft tissues are unremarkable. IMPRESSION: 1. No significant abnormality. Electronically signed by: Lynwood Seip MD 09/19/2024 12:13 PM EST RP Workstation: [id number]   Intravitreal Injection, Pharmacologic Agent - OD - Right Eye Result Date: 09/04/2024 Time Out 09/04/2024. 2:37 PM. Confirmed correct patient, procedure, site, and patient consented. Anesthesia Topical anesthesia was used. Anesthetic medications included Lidocaine  2%, Proparacaine 0.5%. Procedure Preparation included 5% betadine to ocular surface, eyelid speculum. A supplied needle was used. Injection: 6 mg faricimab -svoa 6 MG/0.05ML (Patient supplied)   Route: Intravitreal, Site: Right Eye   NDC: 49757-903-93, Lot: [id number], Expiration date: 07/14/2025, Waste: 0 mL Post-op Post injection exam found visual acuity of at least counting fingers. The patient tolerated the procedure well. There were no complications. The patient received written and verbal  post procedure care education. Post injection medications were not given. Notes **PAP MEDICATION ADMINISTERED**   Intravitreal Injection, Pharmacologic Agent - OS - Left Eye Result Date: 09/04/2024 Time Out 09/04/2024. 2:37 PM. Confirmed correct patient, procedure, site, and patient consented. Anesthesia Topical anesthesia was used. Anesthetic medications included Lidocaine  2%, Proparacaine 0.5%. Procedure Preparation included 5% betadine to ocular surface, eyelid speculum. A supplied (32g) needle was used. Injection: 6 mg faricimab -svoa 6 MG/0.05ML (Patient supplied)   Route: Intravitreal, Site: Left Eye   NDC: 49757-903-93, Lot: [id number], Expiration date: 07/14/2025, Waste: 0 mL Post-op Post injection exam found visual acuity of at least counting fingers. The patient tolerated the procedure well. There were no complications. The patient received written and verbal post procedure care education. Post injection medications were not given. Notes **PAP MEDICATION ADMINISTERED**  OCT, Retina - OU - Both Eyes Result Date: 09/04/2024 Right Eye Quality was good. Central Foveal Thickness: 269. Progression has improved. Findings include normal foveal contour, no SRF, intraretinal fluid, vitreomacular adhesion (Interval resolution of trace cystic changes ST fovea, partial PVD). Left Eye Quality was good. Central Foveal Thickness: 269. Progression has been stable. Findings include normal foveal contour, no IRF, no SRF, intraretinal hyper-reflective material (persistent cystic changes and punctate IRHM, partial PVD). Notes *Images captured and stored on drive Diagnosis / Impression: +DME OU OD: Interval resolution of trace cystic changes ST fovea,  partial PVD OS: persistent cystic  changes and punctate IRHM, partial PVD Clinical management: See below Abbreviations: NFP - Normal foveal profile. CME - cystoid macular edema. PED - pigment epithelial detachment. IRF - intraretinal fluid. SRF - subretinal fluid. EZ -  ellipsoid zone. ERM - epiretinal membrane. ORA - outer retinal atrophy. ORT - outer retinal tubulation. SRHM - subretinal hyper-reflective material. IRHM - intraretinal hyper-reflective material   Microbiology: Results for orders placed or performed during the hospital encounter of 09/19/24  Blood culture (routine x 2)     Status: Abnormal   Collection Time: 09/19/24 12:51 PM   Specimen: BLOOD RIGHT ARM  Result Value Ref Range Status   Specimen Description   Final    BLOOD RIGHT ARM Performed at [facility], [address], [place], [place] [address]    Special Requests   Final    Blood Culture adequate volume Performed at [facility], [address], [place], [place] [address]    Culture  Setup Time   Final    ANAEROBIC BOTTLE ONLY GRAM POSITIVE RODS Gram Stain Report Called to,Read Back By and Verified With: JAYSON ISLAND AT 1556 ON 12.11.25 BY ADGER J  Performed at [facility], [address], [place], [place] [address]    Culture (A)  Final    CUTIBACTERIUM ACNES Standardized susceptibility testing for this organism is not available. Performed at [facility], [address], [place], [place] [address]    Report Status 09/26/2024 FINAL  Final  Blood culture (routine x 2)     Status: None   Collection Time: 09/19/24 12:56 PM   Specimen: Left Antecubital; Blood  Result Value Ref Range Status   Specimen Description LEFT ANTECUBITAL  Final   Special Requests Blood Culture adequate volume  Final   Culture   Final    NO GROWTH 5 DAYS Performed at [facility], [address], [place], [place] [address]    Report Status 09/24/2024 FINAL  Final  MRSA Next Gen by PCR, Nasal     Status: None   Collection Time: 09/19/24  2:05 PM   Specimen: Nasal Mucosa; Nasal Swab  Result Value Ref Range Status   MRSA by PCR Next Gen NOT DETECTED NOT DETECTED Final    Comment: (NOTE) The GeneXpert MRSA Assay (FDA approved for NASAL specimens only), is one component of a comprehensive MRSA  colonization surveillance program. It is not intended to diagnose MRSA infection nor to guide or monitor treatment for MRSA infections. Test performance is not FDA approved in patients less than 22 years old. Performed at [facility], [address], [place], [place] [address]     Labs: CBC: Recent Labs  Lab 09/23/24 0518  WBC 11.2*  NEUTROABS 8.6*  HGB 11.1*  HCT 39.5  MCV 84.2  PLT 363   Basic Metabolic Panel: Recent Labs  Lab 09/24/24 0624 09/25/24 0501 09/26/24 0502 09/27/24 0417 09/28/24 0411 09/29/24 0440  NA 140 141 140 140 139 139  K 3.4* 3.6 3.6 3.6 3.9 3.9  CL 98 97* 97* 99 99 100  CO2 39* 38* 37* 36* 36* 35*  GLUCOSE 145* 122* 116* 142* 152* 154*  BUN 21 18 17 18 17 16   CREATININE 1.21 1.07 1.13 1.13 1.11 1.08  CALCIUM  8.5* 8.6* 8.9 8.5* 8.5* 8.5*  MG 2.4 2.0 2.0 2.1 2.1  --    Liver Function Tests: No results for input(s): AST, ALT, ALKPHOS, BILITOT, PROT, ALBUMIN in the last 168 hours. CBG: Recent Labs  Lab 09/28/24 1106 09/28/24  1629 09/28/24 2126 09/29/24 0246 09/29/24 0800  GLUCAP 167* 138* 231* 111* 135*    Discharge time spent: greater than 30 minutes.  Signed: Alm Schneider, MD [facility] 09/29/2024

## 2024-09-29 NOTE — Progress Notes (Signed)
 Heart Failure Navigator Progress Note  Assessed for Heart & Vascular TOC clinic readiness. Attempted to call patient in his room at [facility] with no answer at this time.  I spoke with him on 12/17/23 for a CHF admission.  He was provided with CHF education then.  He declined a hospital CHF TOC appointment at that encounter and shared he would only be following up with his cardiologist Dr. [name], MD.    Navigator available for reassessment of patient and will sign off at this time.  Charmaine Pines, RN, BSN [facility] Heart Failure Navigator Secure Chat Only

## 2024-09-29 NOTE — Progress Notes (Signed)
 Mobility Specialist Progress Note:    09/29/24 0900  Mobility  Activity Ambulated with assistance  Level of Assistance Minimal assist, patient does 75% or more  Assistive Device Front wheel walker  Distance Ambulated (ft) 140 ft  Range of Motion/Exercises Active;All extremities  Activity Response Tolerated well  Mobility Referral Yes  Mobility visit 1 Mobility  Mobility Specialist Start Time (ACUTE ONLY) 0900  Mobility Specialist Stop Time (ACUTE ONLY) 0935  Mobility Specialist Time Calculation (min) (ACUTE ONLY) 35 min   Pt received in bed, agreeable to mobility. Required MinA to stand and SBA to ambulate with RW. Tolerated well, asx throughout. Left in chair, alarm on. Call bell in reach, all needs met.  Jason Hauge Mobility Specialist Please contact via Special Educational Needs Teacher or  Rehab office at [phone]

## 2024-09-29 NOTE — Progress Notes (Signed)
 Report called to Arland Ee RN at [facility]. EMS called per social.

## 2024-10-06 NOTE — Progress Notes (Signed)
 " [facility] - Clinic Note  10/20/2024    CHIEF COMPLAINT Patient presents for Retina Follow Up  HISTORY OF PRESENT ILLNESS: Lucas Dudley is a 83 y.o. male who presents to the clinic today for:  HPI     Retina Follow Up   Patient presents with  Diabetic Retinopathy.  In both eyes.  This started 6 weeks ago.  I, the attending physician,  performed the HPI with the patient and updated documentation appropriately.        Comments   Pt denies any changes or concerns with vision. PT has been consistent with IOP meds.      Last edited by Valdemar Rogue, MD on 10/20/2024  5:32 PM.    Pt states he suffered a fall in December, pt was hospitalized for 10 days then in rehab for 2 weeks. Pt does not report a decrease in vision.    Referring physician: [name], MD [address] [place],  [place] [address]  HISTORICAL INFORMATION:   Selected notes from the MEDICAL RECORD NUMBER Referred by Dr. Lelon for DEE   CURRENT MEDICATIONS: Current Outpatient Medications (Ophthalmic Drugs)  Medication Sig   COMBIGAN  0.2-0.5 % ophthalmic solution Place 1 drop into both eyes 2 (two) times daily.   dorzolamide  (TRUSOPT ) 2 % ophthalmic solution 1 drop 2 (two) times daily.   No current facility-administered medications for this visit. (Ophthalmic Drugs)   Current Outpatient Medications (Other)  Medication Sig   albuterol  (VENTOLIN  HFA) 108 (90 Base) MCG/ACT inhaler Inhale 2 puffs into the lungs every 6 (six) hours as needed for wheezing or shortness of breath. (Patient taking differently: Inhale 2 puffs into the lungs daily.)   ammonium lactate  (AMLACTIN DAILY) 12 % lotion Apply 1 Application topically as needed.   aspirin  81 MG chewable tablet Chew 81 mg by mouth daily.   camphor-menthol (ANTI-ITCH) lotion Apply 1 Application topically daily as needed for itching.   colchicine  0.6 MG tablet TAKE 2 TABLETS ON DAY 1, FOLLOWED BY 1 TABLET 1 HOUR LATER. PLEASE TAKE 1 TABLET  ONCE DAILY FROM DAY 2 TILL SWELLING IMPROVES.   dapagliflozin  propanediol (FARXIGA ) 10 MG TABS tablet Take 1 tablet (10 mg total) by mouth daily.   diltiazem  (CARDIZEM  CD) 120 MG 24 hr capsule Take 1 capsule (120 mg total) by mouth daily.   ELDERBERRY PO Take 2 tablets by mouth daily. Chewable   ezetimibe  (ZETIA ) 10 MG tablet Take 1 tablet (10 mg total) by mouth daily.   ferrous sulfate  325 (65 FE) MG tablet Take 1 tablet (325 mg total) by mouth daily with breakfast.   fluticasone  (FLONASE ) 50 MCG/ACT nasal spray Place 1 spray into both nostrils daily. SPRAY 2 SPRAYS INTO EACH NOSTRIL EVERY DAY   gabapentin  (NEURONTIN ) 100 MG capsule Take 1 capsule (100 mg total) by mouth 3 (three) times daily.   glimepiride  (AMARYL ) 2 MG tablet Take 1 tablet (2 mg total) by mouth daily with breakfast.   insulin  degludec (TRESIBA  FLEXTOUCH) 100 UNIT/ML FlexTouch Pen Inject 50 Units into the skin daily.   magnesium  gluconate (MAGONATE) 500 MG tablet Take 500 mg by mouth daily.   mineral oil liquid Take 15 mLs by mouth daily as needed for moderate constipation.   OVER THE COUNTER MEDICATION Take 1 tablet by mouth daily. Beets Chew (Patient not taking: Reported on 10/19/2024)   pantoprazole  (PROTONIX ) 40 MG tablet TAKE 1 TABLET BY MOUTH EVERY DAY   torsemide  (DEMADEX ) 20 MG tablet  40 MG IN THE MORNING AND 20 MG IN THE EVENING (Patient taking differently: Take 20 mg by mouth 2 (two) times daily.)   vitamin E 45 MG (100 UNITS) capsule Take 100 Units by mouth daily.   zinc  gluconate 50 MG tablet Take 50 mg by mouth daily.   No current facility-administered medications for this visit. (Other)   REVIEW OF SYSTEMS: ROS   Positive for: Gastrointestinal, Endocrine, Cardiovascular, Eyes, Respiratory Negative for: Constitutional, Neurological, Skin, Genitourinary, Musculoskeletal, HENT, Psychiatric, Allergic/Imm, Heme/Lymph Last edited by Elnor Avelina RAMAN, COT on 10/20/2024  2:12 PM.         ALLERGIES Allergies   Allergen Reactions   Silicone Dermatitis   Other Dermatitis and Other (See Comments)    Adhesive agent (substance)   Azithromycin  Nausea Only   Tape Rash and Other (See Comments)    Causes skin redness, Use paper tape only.   PAST MEDICAL HISTORY Past Medical History:  Diagnosis Date   Arthritis    Atrial fibrillation (HCC)    CAD (coronary artery disease)    a. Cath 03/17/15 showing 100% ostial D1, 50% prox LAD to mid LAD, 40% RPDA stenosis. Med rx. // Myoview  01/2020: EF 31 diffuse perfusion defect without reversibility (suspect artifact); reviewed with Dr. Suellen study felt to be low risk   Cataract    Mixed form OD   Chronic diastolic CHF (congestive heart failure) (HCC)    Chronically elevated hemidiaphragm - Right Side    Diabetic retinopathy (HCC)    NPDR OU   Essential hypertension    Glaucoma    POAG OU   History of gout    Hyperlipidemia    Hypertensive retinopathy    OU   Kidney stones    Melanoma of neck (HCC)    NICM (nonischemic cardiomyopathy) (HCC)    OSA on CPAP 2012   Prostate cancer (HCC)    Type II diabetes mellitus (HCC)    Past Surgical History:  Procedure Laterality Date   ABDOMINAL HERNIA REPAIR     w/mesh   ATRIAL FIBRILLATION ABLATION N/A 06/06/2021   Procedure: ATRIAL FIBRILLATION ABLATION;  Surgeon: Kelsie Agent, MD;  Location: [facility] INVASIVE CV LAB;  Service: Cardiovascular;  Laterality: N/A;   BIOPSY  05/24/2022   Procedure: BIOPSY;  Surgeon: Shaaron Lamar HERO, MD;  Location: [facility];  Service: Endoscopy;;   BIOPSY  05/30/2022   Procedure: BIOPSY;  Surgeon: Shaaron Lamar HERO, MD;  [facility];  Service: Endoscopy;;   BIOPSY  01/10/2023   Procedure: BIOPSY;  Surgeon: Wilhelmenia Aloha Raddle., MD;  Location: [facility];  Service: Gastroenterology;;   CARDIAC CATHETERIZATION N/A 03/17/2015   Procedure: Left Heart Cath and Coronary Angiography;  Surgeon: Dorn JINNY Lesches, MD;  Location: [facility] INVASIVE CV LAB;  Service:  Cardiovascular;  Laterality: N/A;   CARDIOVERSION N/A 08/09/2021   Procedure: CARDIOVERSION;  Surgeon: Raford Riggs, MD;  Location: [facility]OSCOPY;  Service: Cardiovascular;  Laterality: N/A;   CARDIOVERSION N/A 11/10/2021   Procedure: CARDIOVERSION;  Surgeon: Sheena Pugh, DO;  Location: [facility];  Service: Cardiovascular;  Laterality: N/A;   carotid doppler  09/17/2008   rigt and left ICAs 0-49%;mildly  abnormal   CATARACT EXTRACTION Left 2020   Dr. Lelon   COLONOSCOPY N/A 05/16/2017   Procedure: COLONOSCOPY;  Surgeon: Golda Claudis PENNER, MD;  [facility]: [facility];  Service: Endoscopy;  Laterality: N/A;  930   COLONOSCOPY WITH PROPOFOL  N/A 05/24/2022   Procedure: COLONOSCOPY WITH PROPOFOL ;  Surgeon: Shaaron,  Lamar HERO, MD;  [facility]: [facility];  Service: Endoscopy;  Laterality: N/A;   DOPPLER ECHOCARDIOGRAPHY  05/25/2009   EF 50-55%,LA mildly dilated, LV function normal   ELECTROPHYSIOLOGIC STUDY N/A 04/05/2015   Procedure: Cardioversion;  Surgeon: Jerel Balding, MD;  Location: [facility];  Service: Cardiovascular;  Laterality: N/A;   ELECTROPHYSIOLOGIC STUDY N/A 09/06/2015   Procedure: Atrial Fibrillation Ablation;  Surgeon: [name], MD;  Location: [facility] INVASIVE CV LAB;  Service: Cardiovascular;  Laterality: N/A;   ELECTROPHYSIOLOGIC STUDY N/A 07/12/2016   redo afib ablation by Dr Rakers   ENTEROSCOPY N/A 05/30/2022   Procedure: ENTEROSCOPY;  Surgeon: Shaaron Lamar HERO, MD;  [facility];  Service: Endoscopy;  Laterality: N/A;   ENTEROSCOPY N/A 01/07/2024   Procedure: ENTEROSCOPY;  Surgeon: Eartha Angelia Sieving, MD;  [facility];  Service: Gastroenterology;  Laterality: N/A;  1:45PM;ASA 3   ESOPHAGOGASTRODUODENOSCOPY (EGD) WITH PROPOFOL  N/A 05/24/2022   Procedure: ESOPHAGOGASTRODUODENOSCOPY (EGD) WITH PROPOFOL ;  Surgeon: Shaaron Lamar HERO, MD;  [facility];  Service: Endoscopy;  Laterality: N/A;   ESOPHAGOGASTRODUODENOSCOPY (EGD) WITH  PROPOFOL  N/A 01/10/2023   Procedure: ESOPHAGOGASTRODUODENOSCOPY (EGD) WITH PROPOFOL ;  Surgeon: Wilhelmenia Aloha Raddle., MD;  Location: [facility];  Service: Gastroenterology;  Laterality: N/A;   EUS N/A 01/10/2023   Procedure: UPPER ENDOSCOPIC ULTRASOUND (EUS) RADIAL;  Surgeon: Wilhelmenia Aloha Raddle., MD;  Location: [facility]OS[facility];  Service: Gastroenterology;  Laterality: N/A;   EXCISIONAL HEMORRHOIDECTOMY     inside and out   EYE SURGERY Left 2020   Cat Sx - Dr. Lelon   FINE NEEDLE ASPIRATION Right    knee; drew ~ 1 quart off   GIVENS CAPSULE STUDY N/A 05/25/2022   Procedure: GIVENS CAPSULE STUDY;  Surgeon: Cindie Carlin POUR, DO;  [facility];  Service: Endoscopy;  Laterality: N/A;   GIVENS CAPSULE STUDY N/A 01/23/2023   Procedure: GIVENS CAPSULE STUDY;  Surgeon: Eartha Angelia Sieving, MD;  [facility];  Service: Gastroenterology;  Laterality: N/A;  8:30am   HERNIA REPAIR     HOT HEMOSTASIS N/A 01/10/2023   Procedure: HOT HEMOSTASIS (ARGON PLASMA COAGULATION/BICAP);  Surgeon: Wilhelmenia Aloha Raddle., MD;  Location: [facility];  Service: Gastroenterology;  Laterality: N/A;   HOT HEMOSTASIS  01/07/2024   Procedure: EGD, WITH ARGON PLASMA COAGULATION;  Surgeon: Eartha Angelia Sieving, MD;  [facility];  Service: Gastroenterology;;   LAPAROSCOPIC CHOLECYSTECTOMY     LEFT ATRIAL APPENDAGE OCCLUSION N/A 06/28/2022   Procedure: LEFT ATRIAL APPENDAGE OCCLUSION;  Surgeon: Wonda Sharper, MD;  Location: [facility] INVASIVE CV LAB;  Service: Cardiovascular;  Laterality: N/A;   MELANOMA EXCISION Right    neck   NM MYOCAR PERF WALL MOTION  02/21/2012   EF 61% ,EXERCISE 7 METS. exercise stopped due to wheezing and shortness of breathe   POLYPECTOMY  05/16/2017   Procedure: POLYPECTOMY;  Surgeon: Golda Claudis PENNER, MD;  [facility];  Service: Endoscopy;;  colon   POLYPECTOMY  05/24/2022   Procedure: POLYPECTOMY;  Surgeon: Shaaron Lamar HERO, MD;  [facility];  Service: Endoscopy;;   POLYPECTOMY  01/10/2023   Procedure: POLYPECTOMY;  Surgeon: Wilhelmenia Aloha Raddle., MD;  Location: [facility];  Service: Gastroenterology;;   PROSTATECTOMY     RIGHT/LEFT HEART CATH AND CORONARY ANGIOGRAPHY N/A 01/27/2024   Procedure: RIGHT/LEFT HEART CATH AND CORONARY ANGIOGRAPHY;  Surgeon: Zenaida Morene PARAS, MD;  Location: [facility] INVASIVE CV LAB;  Service: Cardiovascular;  Laterality: N/A;   SHOULDER  OPEN ROTATOR CUFF REPAIR Right X 2   SUBMUCOSAL TATTOO INJECTION  05/30/2022   Procedure: SUBMUCOSAL TATTOO INJECTION;  Surgeon: Shaaron Lamar HERO, MD;  [facility];  Service: Endoscopy;;   SUBMUCOSAL TATTOO INJECTION  01/10/2023   Procedure: SUBMUCOSAL TATTOO INJECTION;  Surgeon: Wilhelmenia Aloha Raddle., MD;  Location: [facility];  Service: Gastroenterology;;   TEE WITHOUT CARDIOVERSION N/A 09/05/2015   Procedure: TRANSESOPHAGEAL ECHOCARDIOGRAM (TEE);  Surgeon: Wilbert JONELLE Bihari, MD;  Location: [facility] ENDOSCOPY;  Service: Cardiovascular;  Laterality: N/A;   TEE WITHOUT CARDIOVERSION N/A 06/28/2022   Procedure: TRANSESOPHAGEAL ECHOCARDIOGRAM (TEE);  Surgeon: Wonda Sharper, MD;  Location: [facility] INVASIVE CV LAB;  Service: Cardiovascular;  Laterality: N/A;   FAMILY HISTORY Family History  Problem Relation Age of Onset   Heart disease Mother    Lung cancer Mother    Heart attack Mother 93   Diabetes Father    Heart disease Father    Stroke Brother    Healthy Daughter    Colon cancer Maternal Aunt        72s   SOCIAL HISTORY Social History   Tobacco Use   Smoking status: Former    Current packs/day: 0.00    Average packs/day: 2.0 packs/day for 27.0 years (54.0 ttl pk-yrs)    Types: Cigarettes    Start date: 89    Quit date: 1992    Years since quitting: 34.0    Passive exposure: Current   Smokeless tobacco: Never   Tobacco comments:    Former smoker 09/27/21  Vaping Use   Vaping status: Never Used  Substance Use Topics   Alcohol use: No     Alcohol/week: 0.0 standard drinks of alcohol    Comment: used to drink; stopped ~ 2008   Drug use: No       OPHTHALMIC EXAM: Base Eye Exam     Visual Acuity (Snellen - Linear)       Right Left   Dist cc 20/30 -2 20/40 +2   Dist ph cc 20/25 -2 20/25 -2    Correction: Glasses         Tonometry (Tonopen, 2:20 PM)       Right Left   Pressure 16 17         Pupils       Pupils Dark Light Shape React APD   Right PERRL 3 2 Round Minimal None   Left PERRL 3 2 Round Minimal None         Visual Fields       Left Right    Full Full         Extraocular Movement       Right Left    Full, Ortho Full, Ortho         Neuro/Psych     Oriented x3: Yes   Mood/Affect: Normal         Dilation     Both eyes: 1.0% Mydriacyl, 2.5% Phenylephrine  @ 2:22 PM           Slit Lamp and Fundus Exam     Slit Lamp Exam       Right Left   Lids/Lashes Dermato, mild MGD Dermato, mild MGD   Conjunctiva/Sclera Temporal pinguecula, mild inferior sub conj heme Temporal pinguecula   Cornea EBMD, mild haze, trace PEE, mild Debris in tear film, well healed cataract wound trace haze, trace PEE, well healed temporal cataract wounds, arcus   Anterior Chamber Deep and clear; narrow temporal angle Deep and  clear   Iris Round and dilated, mild anterior bowing, No NVI Round and moderately dilated to 5.1mm   Lens PCIOL in good position, open PC PC IOL in good position with open PC   Anterior Vitreous Synerisis Synerisis         Fundus Exam       Right Left   Disc Mild pallor, sharp rim, +cupping, thin inferior rim Mild pallor, sharp rim, +cupping, +PPA   C/D Ratio 0.7 0.7   Macula Flat, good foveal reflex, trace cystic changes - improved, RPE mottling and clumping, no heme, Drusen, no exudates Flat, blunted foveal reflex, trace cystic changes, punctate MA-stably improved   Vessels attenuated, Tortuous attenuated, Tortuous   Periphery Attached, rare MA, focal DBH nasal to  disc--improved Attached. No heme.           Refraction     Wearing Rx       Sphere Cylinder Axis Add   Right -0.25 +2.00 158 +2.50   Left -1.00 +1.25 008 +2.50           IMAGING AND PROCEDURES  Imaging and Procedures for 10/20/2024  OCT, Retina - OU - Both Eyes       Right Eye Quality was good. Central Foveal Thickness: 265. Progression has been stable. Findings include normal foveal contour, no IRF, no SRF, vitreomacular adhesion (Stable resolution of trace cystic changes ST fovea, partial PVD).   Left Eye Quality was good. Central Foveal Thickness: 259. Progression has been stable. Findings include normal foveal contour, no IRF, no SRF, intraretinal hyper-reflective material (persistent cystic changes and punctate IRHM, partial PVD).   Notes *Images captured and stored on drive  Diagnosis / Impression:  +DME OU OD: Stable resolution of trace cystic changes ST fovea,  partial PVD OS: persistent cystic changes and punctate IRHM, partial PVD  Clinical management:  See below  Abbreviations: NFP - Normal foveal profile. CME - cystoid macular edema. PED - pigment epithelial detachment. IRF - intraretinal fluid. SRF - subretinal fluid. EZ - ellipsoid zone. ERM - epiretinal membrane. ORA - outer retinal atrophy. ORT - outer retinal tubulation. SRHM - subretinal hyper-reflective material. IRHM - intraretinal hyper-reflective material     Intravitreal Injection, Pharmacologic Agent - OD - Right Eye       Time Out 10/20/2024. 3:26 PM. Confirmed correct patient, procedure, site, and patient consented.   Anesthesia Topical anesthesia was used. Anesthetic medications included Lidocaine  2%, Proparacaine 0.5%.   Procedure Preparation included 5% betadine to ocular surface, eyelid speculum. A supplied needle was used.   Injection: 6 mg faricimab -svoa 6 MG/0.05ML (Patient supplied)   Route: Intravitreal, Site: Right Eye   NDC: 49757-903-93, Lot: [id number], Expiration date:  07/14/2025, Waste: 0 mL   Post-op Post injection exam found visual acuity of at least counting fingers. The patient tolerated the procedure well. There were no complications. The patient received written and verbal post procedure care education. Post injection medications were not given.   Notes **PAP MEDICATION ADMINISTERED**      Intravitreal Injection, Pharmacologic Agent - OS - Left Eye       Time Out 10/20/2024. 3:27 PM. Confirmed correct patient, procedure, site, and patient consented.   Anesthesia Topical anesthesia was used. Anesthetic medications included Lidocaine  2%, Proparacaine 0.5%.   Procedure Preparation included 5% betadine to ocular surface, eyelid speculum. A supplied (32g) needle was used.   Injection: 6 mg faricimab -svoa 6 MG/0.05ML (Patient supplied)   Route: Intravitreal, Site: Left Eye   NDC: 49757-903-93, Lot:  [id number], Expiration date: 07/14/2025, Waste: 0 mL   Post-op Post injection exam found visual acuity of at least counting fingers. The patient tolerated the procedure well. There were no complications. The patient received written and verbal post procedure care education. Post injection medications were not given.   Notes **PAP MEDICATION ADMINISTERED**           ASSESSMENT/PLAN:   ICD-10-CM   1. 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 Current use of insulin  (HCC)  Z79.4     3. Long term (current) use of oral hypoglycemic drugs  Z79.84     4. Essential hypertension  I10     5. Hypertensive retinopathy of both eyes  H35.033     6. Pseudophakia of both eyes   Z96.1     7. 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 History of herpes zoster of eye  Z86.19     9. Primary open angle glaucoma of both eyes, unspecified glaucoma stage  H40.1130       1-3. 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 Single focal MA superior to fovea; OS: Perifoveal Mas w/ late leakage - s/p IVA OS # 1(07.26.21), #2 (08.24.21), #3 (09.21.21), #4 (10.22.21)  -- IVA resistance ============================= - s/p IVE OD #1 (09.29.23), #2 (10.30.23), #3 (SAMPLE) (11.27.23) #4 (12.29.23) #5 (01.26.24) #6 (02.23.24) -- IVE resistance - s/p IVE OS #1 (11.22.21), #2 (12.21.21), #3 (01.20.22), #4 (02.18.22), #5 (03.18.22), #6 (04.15.22), #7 (05.10.22) -- sample, #8 (06.08.22), #9 (07.12.22), #10 (08.12.22), #11 (09.16.22), #12 (10.21.22), #13 (11.18.22), #14 (12.21.22), #15 (01.20.23), #16 (02.17.23), #17 (03.24.23), #18 (04.21.23), #19 (05.26.23), #20 (06.23.23) -- IVE resistance ============================ - s/p IVV OD #1 (03.22.24), #2 (04.19.24), #3 (05.17.24), #4 (06.18.24), #5 (07.19.24), #6 (08.23.24), #7 (09.27.24), #8 (11.01.24), #9 (12.06.24), #10 (01.10.25 -- sample), #11 (02.14.25 -- sample) #12 (03.21.25 -- sample), #13 (04.25.25 -- sample) #14(05.30.25), #15 (07.11.25--PAP) #16(08.26.25) sample #17(10.10.25), #18 (11.21.25 PAP) - s/p IVV OS #1 (07.21.23), #2 (08.25.23), #3 (09.29.23), #4 (10.30.23), #5 (11.27.23) #6 (12.29.23), #7 (01.26.23), #8 (02.23.24), #9 (03.22.24), #10 (04.19.24), #11 (05.17.24), #12 (12.06.24), #13 (02.14.25 -- sample), #14 (03.21.25 -- sample),  #15 (04.25.25 -- sample), #16 (05.30.25 -- sample), #17 (07.11.25--PAP), #18 (08.26.25) sample, #19 (10.10.2025) PAP, #20 (PAP 11.21.25) ============================ - BCVA OD: 20/25 from 20/20; OS 20/25 from 20/20 - OCT shows OD: Stable resolution of trace cystic changes ST fovea, partial PVD; OS: persistent cystic changes and punctate  IRHM, partial PVD at 6+ weeks - recommend IVV OD #19 and IVV OS #21 (PAP MEDS) today, 01.06.26 with f/u in 6 weeks [[employer] funding unavailable)  - pt wishes to proceed with injections OU  - RBA of procedure discussed, questions answered  - Vabysmo  informed consent obtained and signed, 02.14.25 (OU) - [employer] approved Vabysmo , no good days for 2025 as of now - see procedure note - pt has PAP medication - f/u 6 weeks -- DFE/OCT, possible injection(s)  4,5. Hypertensive retinopathy OU - discussed importance of tight BP control - continue to monitor  6. Pseudophakia OU  - s/p  CE/IOL OS (2020, Dr. Lelon)             - s/p CE/IOL OD (2022, Dr. Lelon)  - s/p YAG cap OS (04.04.23)  - s/p YAG cap OD (11.17.25, Dr. Fleeta) - BCVA 20/30 OD and 20/25 OS from 20/150  - IOLs in good position, doing well  - continue to monitor  7. 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 Caresse in August 2022  - s/p SuperK OS on 12.12.2022  8. History of herpes zoster/iritis OS  - on valtrex  1g daily--maintenance  9. POAG OU  - was under the expert management of Dr. Lelon, now follows with Dr. Fleeta  - IOP 16,17  - continue combigan  bid OU  - Dr. Fleeta added orange top gtt  Ophthalmic Meds Ordered this visit:  Meds ordered this encounter  Medications   faricimab -svoa (VABYSMO ) 6mg /0.56mL intravitreal injection   faricimab -svoa (VABYSMO ) 6mg /0.58mL intravitreal injection     This document serves as a record of services personally performed by Redell JUDITHANN Hans, MD, PhD. It was created on their behalf by Wanda GEANNIE Keens, COT an ophthalmic technician. The creation of this record is the provider's dictation and/or activities during the visit.    Electronically signed by:  Wanda GEANNIE Keens, COT  10/22/2024 9:07 PM  This document serves as a record of services personally performed by Redell JUDITHANN Hans, MD, PhD. It was created on their behalf by Almetta Pesa, an ophthalmic technician. The creation of  this record is the provider's dictation and/or activities during the visit.    Electronically signed by: Almetta Pesa, OA, 10/22/2024  9:07 PM  Redell JUDITHANN Hans, M.D., Ph.D. Diseases & Surgery of the Retina and Vitreous [facility]  I have reviewed the above documentation for accuracy and completeness, and I agree with the above. Redell JUDITHANN Hans, M.D., Ph.D. 10/22/2024 9:10 PM    Abbreviations: M myopia (nearsighted); A astigmatism; H hyperopia (farsighted); P presbyopia; Mrx spectacle prescription;  CTL contact lenses; OD right eye; OS left eye; OU both eyes  XT exotropia; ET esotropia; PEK punctate epithelial keratitis; PEE punctate epithelial erosions; DES dry eye syndrome; MGD meibomian gland dysfunction; ATs artificial tears; PFAT's preservative free artificial tears; NSC nuclear sclerotic cataract; PSC posterior subcapsular cataract; ERM epi-retinal membrane; PVD posterior vitreous detachment; RD retinal detachment; DM diabetes mellitus; DR diabetic retinopathy; NPDR non-proliferative diabetic retinopathy; PDR proliferative diabetic retinopathy; CSME clinically significant macular edema; DME diabetic macular edema; dbh dot blot hemorrhages; CWS cotton wool spot; POAG primary open angle glaucoma; C/D cup-to-disc ratio; HVF humphrey visual field; GVF goldmann visual field; OCT optical coherence tomography; IOP intraocular pressure; BRVO Branch retinal vein occlusion; CRVO central retinal vein occlusion; CRAO central retinal artery occlusion; BRAO branch retinal artery occlusion; RT retinal tear; SB scleral buckle; PPV pars plana vitrectomy; VH Vitreous hemorrhage; PRP panretinal laser photocoagulation; IVK intravitreal kenalog; VMT vitreomacular traction; MH Macular hole;  NVD neovascularization of the disc; NVE neovascularization elsewhere; AREDS age related eye disease study; ARMD age related macular degeneration; POAG primary open angle glaucoma; EBMD epithelial/anterior  basement membrane dystrophy; ACIOL anterior chamber intraocular lens; IOL intraocular lens; PCIOL posterior chamber intraocular lens; Phaco/IOL phacoemulsification with intraocular lens placement; PRK photorefractive keratectomy; LASIK laser assisted in situ keratomileusis; HTN hypertension; DM diabetes mellitus; COPD chronic obstructive pulmonary disease "

## 2024-10-11 ENCOUNTER — Other Ambulatory Visit: Payer: Self-pay | Admitting: Internal Medicine

## 2024-10-11 DIAGNOSIS — M1A00X0 Idiopathic chronic gout, unspecified site, without tophus (tophi): Secondary | ICD-10-CM

## 2024-10-12 ENCOUNTER — Encounter: Payer: Self-pay | Admitting: *Deleted

## 2024-10-19 ENCOUNTER — Telehealth: Payer: Self-pay

## 2024-10-19 NOTE — Transitions of Care (Post Inpatient/ED Visit) (Signed)
 "  10/19/2024  Name: Lucas Dudley MRN: [id number] DOB: [birth year]  Today's TOC FU Call Status: Today's TOC FU Call Status:: Successful TOC FU Call Completed TOC FU Call Complete Date: 10/19/24  Patient's Name and Date of Birth confirmed. Name, DOB  Transition Care Management Follow-up Telephone Call Date of Discharge: 10/15/24 Discharge Facility: Other [facility]) Name of Other ([facility]) Discharge Facility: [facility] Type of Discharge: Inpatient Admission Primary Inpatient Discharge Diagnosis:: bactermia How have you been since you were released from the hospital?: Better Any questions or concerns?: No  Items Reviewed: Did you receive and understand the discharge instructions provided?: Yes Medications obtained,verified, and reconciled?: Yes (Medications Reviewed) Any new allergies since your discharge?: No Dietary orders reviewed?: Yes Do you have support at home?: No  Medications Reviewed Today: Medications Reviewed Today     Reviewed by Emmitt Pan, LPN (Licensed Practical Nurse) on 10/19/24 at 1021  Med List Status: <None>   Medication Order Taking? Sig Documenting Provider Last Dose Status Informant  albuterol  (VENTOLIN  HFA) 108 (90 Base) MCG/ACT inhaler [id number] Yes Inhale 2 puffs into the lungs every 6 (six) hours as needed for wheezing or shortness of breath.  Patient taking differently: Inhale 2 puffs into the lungs daily.   [name], MD  Active Self  ammonium lactate  [facility] DAILY) 12 % lotion [id number] Yes Apply 1 Application topically as needed. Tobie Franky SQUIBB, DPM  Active Self  aspirin  81 MG chewable tablet [id number] Yes Chew 81 mg by mouth daily. [provider]  Active Self  camphor-menthol (ANTI-ITCH) lotion [id number] Yes Apply 1 Application topically daily as needed for itching. [provider]  Active Self  colchicine  0.6 MG tablet [id number] Yes TAKE 2 TABLETS ON DAY 1, FOLLOWED BY 1 TABLET 1 HOUR LATER. PLEASE TAKE 1  TABLET ONCE DAILY FROM DAY 2 TILL SWELLING IMPROVES. [name], MD  Active   COMBIGAN  0.2-0.5 % ophthalmic solution [id number] Yes Place 1 drop into both eyes 2 (two) times daily. [provider]  Active Self  dapagliflozin  propanediol (FARXIGA ) 10 MG TABS tablet [id number] Yes Take 1 tablet (10 mg total) by mouth daily. Miriam Norris, NP  Active   diltiazem  (CARDIZEM  CD) 120 MG 24 hr capsule [id number] Yes Take 1 capsule (120 mg total) by mouth daily. Evonnie Lenis, MD  Active   dorzolamide  (TRUSOPT ) 2 % ophthalmic solution [id number] Yes 1 drop 2 (two) times daily. [provider]  Active   ELDERBERRY PO [id number] Yes Take 2 tablets by mouth daily. Chewable [provider]  Active Self  ezetimibe  (ZETIA ) 10 MG tablet [id number] Yes Take 1 tablet (10 mg total) by mouth daily. Debera Jayson MATSU, MD  Active   ferrous sulfate  325 (65 FE) MG tablet [id number] Yes Take 1 tablet (325 mg total) by mouth daily with breakfast. Lamon Herter M, PA-C  Active Self  fluticasone  (FLONASE ) 50 MCG/ACT nasal spray [id number] Yes Place 1 spray into both nostrils daily. SPRAY 2 SPRAYS INTO EACH NOSTRIL EVERY DAY [name], MD  Active   gabapentin  (NEURONTIN ) 300 MG capsule [id number] Yes Take 1 capsule (300 mg total) by mouth 3 (three) times daily.  Patient taking differently: Take 100 mg by mouth 2 (two) times daily.   Tobie Franky SQUIBB, DPM  Active   Insulin  Glargine (BASAGLAR  KWIKPEN) 100 UNIT/ML [id number] Yes INJECT 45 UNITS INTO THE SKIN AT BEDTIME.  Patient taking differently: Inject 44 Units into the skin at bedtime.   [name], MD  Active   magnesium  gluconate (MAGONATE) 500 MG tablet [id number] Yes Take 500 mg by mouth daily. [provider]  Active Self  mineral oil liquid [id number] Yes Take 15 mLs by mouth daily as needed for moderate constipation. [provider]  Active Self  OVER THE COUNTER MEDICATION [id number]  Take 1 tablet by mouth daily.  Beets Chew  Patient not taking: Reported on 10/19/2024   [provider]  Active Self  pantoprazole  (PROTONIX ) 40 MG tablet [id number] Yes TAKE 1 TABLET BY MOUTH EVERY DAY Carlan, Chelsea L, NP  Active   torsemide  (DEMADEX ) 20 MG tablet [id number] Yes 40 MG IN THE MORNING AND 20 MG IN THE EVENING  Patient taking differently: Take 20 mg by mouth 2 (two) times daily.   Debera Jayson MATSU, MD  Active   vitamin E 45 MG (100 UNITS) capsule [id number] Yes Take 100 Units by mouth daily. [provider]  Active   zinc  gluconate 50 MG tablet [id number] Yes Take 50 mg by mouth daily. [provider]  Active Self            Home Care and Equipment/Supplies: Were Home Health Services Ordered?: Yes Name of Home Health Agency:: unknown Has Agency set up a time to come to your home?: Yes First Home Health Visit Date: 10/19/24 Any new equipment or medical supplies ordered?: NA  Functional Questionnaire: Do you need assistance with bathing/showering or dressing?: No Do you need assistance with meal preparation?: No Do you need assistance with eating?: No Do you have difficulty maintaining continence: No Do you need assistance with getting out of bed/getting out of a chair/moving?: No Do you have difficulty managing or taking your medications?: No  Follow up appointments reviewed: PCP Follow-up appointment confirmed?: No Specialist Hospital Follow-up appointment confirmed?: NA Do you need transportation to your follow-up appointment?: No Do you understand care options if your condition(s) worsen?: Yes-patient verbalized understanding    SIGNATURE Julian Lemmings, LPN [facility] Nurse Health Advisor Direct Dial [phone]  "

## 2024-10-19 NOTE — Telephone Encounter (Signed)
 Copied from CRM [id number]. Topic: General - Other >> Oct 16, 2024  4:55 PM Hadassah PARAS wrote: Reason for CRM: Jasmine from [facility] is calling to advise that pt has delated PT home orders. He has req to begin Mon Jan 5th. Any questions please call  #[phone]

## 2024-10-20 ENCOUNTER — Encounter (INDEPENDENT_AMBULATORY_CARE_PROVIDER_SITE_OTHER): Payer: Self-pay | Admitting: Ophthalmology

## 2024-10-20 ENCOUNTER — Ambulatory Visit (INDEPENDENT_AMBULATORY_CARE_PROVIDER_SITE_OTHER): Admitting: Ophthalmology

## 2024-10-20 DIAGNOSIS — E113213 Type 2 diabetes mellitus with mild nonproliferative diabetic retinopathy with macular edema, bilateral: Secondary | ICD-10-CM | POA: Diagnosis not present

## 2024-10-20 DIAGNOSIS — H35033 Hypertensive retinopathy, bilateral: Secondary | ICD-10-CM | POA: Diagnosis not present

## 2024-10-20 DIAGNOSIS — I10 Essential (primary) hypertension: Secondary | ICD-10-CM | POA: Diagnosis not present

## 2024-10-20 DIAGNOSIS — Z7984 Long term (current) use of oral hypoglycemic drugs: Secondary | ICD-10-CM | POA: Diagnosis not present

## 2024-10-20 DIAGNOSIS — Z961 Presence of intraocular lens: Secondary | ICD-10-CM

## 2024-10-20 DIAGNOSIS — H18523 Epithelial (juvenile) corneal dystrophy, bilateral: Secondary | ICD-10-CM

## 2024-10-20 DIAGNOSIS — Z794 Long term (current) use of insulin: Secondary | ICD-10-CM

## 2024-10-20 DIAGNOSIS — Z8619 Personal history of other infectious and parasitic diseases: Secondary | ICD-10-CM

## 2024-10-20 DIAGNOSIS — H401130 Primary open-angle glaucoma, bilateral, stage unspecified: Secondary | ICD-10-CM

## 2024-10-20 MED ORDER — FARICIMAB-SVOA 6 MG/0.05ML IZ SOSY
6.0000 mg | PREFILLED_SYRINGE | INTRAVITREAL | Status: AC | PRN
  Administered 2024-10-20: 6 mg via INTRAVITREAL

## 2024-10-22 ENCOUNTER — Encounter: Payer: Self-pay | Admitting: Internal Medicine

## 2024-10-22 ENCOUNTER — Ambulatory Visit: Payer: Self-pay | Admitting: Internal Medicine

## 2024-10-22 ENCOUNTER — Other Ambulatory Visit: Payer: Self-pay | Admitting: Internal Medicine

## 2024-10-22 VITALS — BP 117/75 | HR 67 | Wt 222.4 lb

## 2024-10-22 DIAGNOSIS — J181 Lobar pneumonia, unspecified organism: Secondary | ICD-10-CM | POA: Diagnosis not present

## 2024-10-22 DIAGNOSIS — E1165 Type 2 diabetes mellitus with hyperglycemia: Secondary | ICD-10-CM

## 2024-10-22 DIAGNOSIS — Z09 Encounter for follow-up examination after completed treatment for conditions other than malignant neoplasm: Secondary | ICD-10-CM | POA: Diagnosis not present

## 2024-10-22 DIAGNOSIS — E1140 Type 2 diabetes mellitus with diabetic neuropathy, unspecified: Secondary | ICD-10-CM

## 2024-10-22 DIAGNOSIS — I480 Paroxysmal atrial fibrillation: Secondary | ICD-10-CM | POA: Diagnosis not present

## 2024-10-22 DIAGNOSIS — A419 Sepsis, unspecified organism: Secondary | ICD-10-CM | POA: Diagnosis not present

## 2024-10-22 DIAGNOSIS — I5042 Chronic combined systolic (congestive) and diastolic (congestive) heart failure: Secondary | ICD-10-CM | POA: Diagnosis not present

## 2024-10-22 DIAGNOSIS — M1A00X0 Idiopathic chronic gout, unspecified site, without tophus (tophi): Secondary | ICD-10-CM

## 2024-10-22 DIAGNOSIS — Z794 Long term (current) use of insulin: Secondary | ICD-10-CM

## 2024-10-22 DIAGNOSIS — N1831 Chronic kidney disease, stage 3a: Secondary | ICD-10-CM

## 2024-10-22 DIAGNOSIS — E782 Mixed hyperlipidemia: Secondary | ICD-10-CM | POA: Diagnosis not present

## 2024-10-22 MED ORDER — TRESIBA FLEXTOUCH 100 UNIT/ML ~~LOC~~ SOPN: 50.0000 [IU] | PEN_INJECTOR | Freq: Every day | SUBCUTANEOUS | 1 refills | Status: DC

## 2024-10-22 MED ORDER — GLIMEPIRIDE 2 MG PO TABS: 2.0000 mg | ORAL_TABLET | Freq: Every day | ORAL | 1 refills | Status: AC

## 2024-10-22 MED ORDER — GABAPENTIN 100 MG PO CAPS: 100.0000 mg | ORAL_CAPSULE | Freq: Three times a day (TID) | ORAL | 3 refills | Status: AC

## 2024-10-22 MED ORDER — DILTIAZEM HCL ER COATED BEADS 120 MG PO CP24: 120.0000 mg | ORAL_CAPSULE | Freq: Every day | ORAL | 1 refills | Status: AC

## 2024-10-22 NOTE — Assessment & Plan Note (Signed)
 Has decreased sensation in certain areas of foot Needs toenail trimming as well - has thick toenails Had referred to podiatry Refilled gabapentin  100 mg 3 times daily

## 2024-10-22 NOTE — Assessment & Plan Note (Signed)
 On torsemide  40 mg QAM and 20 mg QPM now, he reports improved leg swelling Daily weight checks Followed by cardiology Held Coreg  due to bradycardia and lisinopril  due to AKI by Cardiology On Farxiga  10 mg daily

## 2024-10-22 NOTE — Assessment & Plan Note (Signed)
 Resolved now Was likely due to lobar pneumonia Was given IV antibiotics - Rocephin  and doxycycline  in the hospital Check CXR after 4 weeks

## 2024-10-22 NOTE — Assessment & Plan Note (Addendum)
 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

## 2024-10-22 NOTE — Assessment & Plan Note (Signed)
 Has had gout flareups at times, likely related to diet Refilled colchicine  to be used as needed for acute flareups Check uric acid level

## 2024-10-22 NOTE — Patient Instructions (Signed)
 Please take Tresiba  50 Units once daily. Please take Glimepiride  2 mg once daily.  Please continue to take other medications as prescribed.  Please continue to follow low carb diet and perform moderate exercise/walking at least 150 mins/week.  Please get fasting blood tests done before the next visit.

## 2024-10-22 NOTE — Assessment & Plan Note (Signed)
On Lopid Check lipid profile

## 2024-10-22 NOTE — Assessment & Plan Note (Addendum)
 Lab Results  Component Value Date   HGBA1C 11.1 (H) 09/19/2024   Uncontrolled, likely due to diet noncompliance Associated with diabetic retinopathy, HTN, CAD and HLD On Basaglar  44 U at bedtime, Farxiga  10 mg QD  Glimepiride  2 mg BID was discontinued during recent hospitalization  Basaglar  not covered by insurance now, switched to Tresiba , increase dose to 50 units QD Restart glimepiride  2 mg QD Does not want to start GLP1 agonist Advised to follow diabetic diet On ACEi F/u CMP and lipid panel Diabetic eye exam: Advised to follow up with Ophthalmology for diabetic eye exam

## 2024-10-22 NOTE — Progress Notes (Signed)
 "  Established Patient Office Visit  Subjective:  Patient ID: Lucas Dudley, male    DOB: 10-Jan-1941  Age: 84 y.o. MRN: [id number]  CC:  Chief Complaint  Patient presents with   Hospitalization Follow-up    Hospital f/u     HPI Lucas Dudley is a 84 y.o. male with past medical history of HTN, CAD, HFrEF, A-fib s/p watchman device, type II DM with retinopathy, HLD, OSA, prostate ca. and morbid obesity who presents for follow-up after recent hospitalization.  He had 2 falls within 5 days prior to the hospitalization.  He was having bilateral LE weakness. His daughter called EMS to check on him because she had not heard from him in a couple of days. EMS found him to have right knee swelling. He complained of shortness of breath and knee pain. He has not had his medications in a couple of days. The ED found him to be tachycardic in A-fib with RVR and was hypoxic and placed on 3 L nasal cannula. His chest x-ray was suspicious for atelectasis and/or pneumonia. He was started on IV diltiazem  infusion, IV antibiotics, Lasix , IV fluids. He was noted to be tachypneic and had normal venous pH. His blood sugar was 239. His CK was 518. Creatinine 1.44, GFR 48.5 mL/min and troponin 91.  He had cutibacterium acnes on initial blood culture, but was negative on repeat check.  He has finished 5 days of antibiotics for lobar pneumonia.  MRI brain was negative for CVA.  MRI lumbar spine showed spinal stenosis and bulging disks.  He reports feeling overall better since returning to home.  He is taking torsemide  40 mg every morning and 20 mg every evening.  He is taking diltiazem  120 mg QD, requests refill of it.  He is in A-fib today, rate controlled.  Type 2 DM: His HbA1c has worsened to 11.1 now. He takes Basaglar  44 U qHS and Glimepiride  2 mg BID (?). He has tried Ozempic in the past, but had supply issues. He denies trying Ozempic again. He checks blood glucose regularly in AM, which have been mostly around 150s,  but likely has hyperglycemia during the day. He had myalgias with statin, takes Gemfibrozil  currently. He is followed by Retina specialist for diabetic retinopathy.   Past Medical History:  Diagnosis Date   Arthritis    Atrial fibrillation (HCC)    CAD (coronary artery disease)    a. Cath 03/17/15 showing 100% ostial D1, 50% prox LAD to mid LAD, 40% RPDA stenosis. Med rx. // Myoview  01/2020: EF 31 diffuse perfusion defect without reversibility (suspect artifact); reviewed with Dr. Suellen study felt to be low risk   Cataract    Mixed form OD   Chronic diastolic CHF (congestive heart failure) (HCC)    Chronically elevated hemidiaphragm - Right Side    Diabetic retinopathy (HCC)    NPDR OU   Essential hypertension    Glaucoma    POAG OU   History of gout    Hyperlipidemia    Hypertensive retinopathy    OU   Kidney stones    Melanoma of neck (HCC)    NICM (nonischemic cardiomyopathy) (HCC)    OSA on CPAP 2012   Prostate cancer (HCC)    Type II diabetes mellitus (HCC)     Past Surgical History:  Procedure Laterality Date   ABDOMINAL HERNIA REPAIR     w/mesh   ATRIAL FIBRILLATION ABLATION N/A 06/06/2021   Procedure: ATRIAL FIBRILLATION ABLATION;  Surgeon: Kelsie,  Lynwood, MD;  Location: [facility];  Service: Cardiovascular;  Laterality: N/A;   BIOPSY  05/24/2022   Procedure: BIOPSY;  Surgeon: Shaaron Lamar HERO, MD;  Location: [facility];  Service: Endoscopy;;   BIOPSY  05/30/2022   Procedure: BIOPSY;  Surgeon: Shaaron Lamar HERO, MD;  [facility]: [facility];  Service: Endoscopy;;   BIOPSY  01/10/2023   Procedure: BIOPSY;  Surgeon: Wilhelmenia Aloha Raddle., MD;  Location: [facility];  Service: Gastroenterology;;   CARDIAC CATHETERIZATION N/A 03/17/2015   Procedure: Left Heart Cath and Coronary Angiography;  Surgeon: Dorn JINNY Lesches, MD;  Location: [facility] CV LAB;  Service: Cardiovascular;  Laterality: N/A;   CARDIOVERSION N/A 08/09/2021   Procedure: CARDIOVERSION;   Surgeon: Raford Riggs, MD;  Location: [facility] ENDOSCOPY;  Service: Cardiovascular;  Laterality: N/A;   CARDIOVERSION N/A 11/10/2021   Procedure: CARDIOVERSION;  Surgeon: Sheena Pugh, DO;  Location: [facility];  Service: Cardiovascular;  Laterality: N/A;   carotid doppler  09/17/2008   rigt and left ICAs 0-49%;mildly  abnormal   CATARACT EXTRACTION Left 2020   Dr. Lelon   COLONOSCOPY N/A 05/16/2017   Procedure: COLONOSCOPY;  Surgeon: Golda Claudis PENNER, MD;  [facility]: [facility];  Service: Endoscopy;  Laterality: N/A;  930   COLONOSCOPY WITH PROPOFOL  N/A 05/24/2022   Procedure: COLONOSCOPY WITH PROPOFOL ;  Surgeon: Shaaron Lamar HERO, MD;  [facility]: [facility];  Service: Endoscopy;  Laterality: N/A;   DOPPLER ECHOCARDIOGRAPHY  05/25/2009   EF 50-55%,LA mildly dilated, LV function normal   ELECTROPHYSIOLOGIC STUDY N/A 04/05/2015   Procedure: Cardioversion;  Surgeon: Jerel Balding, MD;  Location: [facility];  Service: Cardiovascular;  Laterality: N/A;   ELECTROPHYSIOLOGIC STUDY N/A 09/06/2015   Procedure: Atrial Fibrillation Ablation;  Surgeon: [name], MD;  Location: [facility] INVASIVE CV LAB;  Service: Cardiovascular;  Laterality: N/A;   ELECTROPHYSIOLOGIC STUDY N/A 07/12/2016   redo afib ablation by Dr Rakers   ENTEROSCOPY N/A 05/30/2022   Procedure: ENTEROSCOPY;  Surgeon: Shaaron Lamar HERO, MD;  [facility];  Service: Endoscopy;  Laterality: N/A;   ENTEROSCOPY N/A 01/07/2024   Procedure: ENTEROSCOPY;  Surgeon: Eartha Angelia Sieving, MD;  [facility];  Service: Gastroenterology;  Laterality: N/A;  1:45PM;ASA 3   ESOPHAGOGASTRODUODENOSCOPY (EGD) WITH PROPOFOL  N/A 05/24/2022   Procedure: ESOPHAGOGASTRODUODENOSCOPY (EGD) WITH PROPOFOL ;  Surgeon: Shaaron Lamar HERO, MD;  [facility];  Service: Endoscopy;  Laterality: N/A;   ESOPHAGOGASTRODUODENOSCOPY (EGD) WITH PROPOFOL  N/A 01/10/2023   Procedure: ESOPHAGOGASTRODUODENOSCOPY (EGD) WITH PROPOFOL ;  Surgeon:  Wilhelmenia Aloha Raddle., MD;  Location: [facility];  Service: Gastroenterology;  Laterality: N/A;   EUS N/A 01/10/2023   Procedure: UPPER ENDOSCOPIC ULTRASOUND (EUS) RADIAL;  Surgeon: Wilhelmenia Aloha Raddle., MD;  Location: [facility];  Service: Gastroenterology;  Laterality: N/A;   EXCISIONAL HEMORRHOIDECTOMY     inside and out   EYE SURGERY Left 2020   Cat Sx - Dr. Lelon   FINE NEEDLE ASPIRATION Right    knee; drew ~ 1 quart off   GIVENS CAPSULE STUDY N/A 05/25/2022   Procedure: GIVENS CAPSULE STUDY;  Surgeon: Cindie Carlin POUR, DO;  [facility]: [facility];  Service: Endoscopy;  Laterality: N/A;   GIVENS CAPSULE STUDY N/A 01/23/2023   Procedure: GIVENS CAPSULE STUDY;  Surgeon: Eartha Angelia Sieving, MD;  [facility]: [facility];  Service: Gastroenterology;  Laterality: N/A;  8:30am   HERNIA REPAIR     HOT HEMOSTASIS N/A 01/10/2023   Procedure: HOT HEMOSTASIS (ARGON PLASMA  COAGULATION/BICAP);  Surgeon: Wilhelmenia Aloha Raddle., MD;  Location: [facility];  Service: Gastroenterology;  Laterality: N/A;   HOT HEMOSTASIS  01/07/2024   Procedure: EGD, WITH ARGON PLASMA COAGULATION;  Surgeon: Eartha Angelia Sieving, MD;  [facility];  Service: Gastroenterology;;   LAPAROSCOPIC CHOLECYSTECTOMY     LEFT ATRIAL APPENDAGE OCCLUSION N/A 06/28/2022   Procedure: LEFT ATRIAL APPENDAGE OCCLUSION;  Surgeon: Wonda Sharper, MD;  Location: [facility] INVASIVE CV LAB;  Service: Cardiovascular;  Laterality: N/A;   MELANOMA EXCISION Right    neck   NM MYOCAR PERF WALL MOTION  02/21/2012   EF 61% ,EXERCISE 7 METS. exercise stopped due to wheezing and shortness of breathe   POLYPECTOMY  05/16/2017   Procedure: POLYPECTOMY;  Surgeon: Golda Claudis PENNER, MD;  [facility];  Service: Endoscopy;;  colon   POLYPECTOMY  05/24/2022   Procedure: POLYPECTOMY;  Surgeon: Shaaron Lamar HERO, MD;  [facility];  Service: Endoscopy;;   POLYPECTOMY  01/10/2023   Procedure: POLYPECTOMY;   Surgeon: Wilhelmenia Aloha Raddle., MD;  Location: [facility];  Service: Gastroenterology;;   PROSTATECTOMY     RIGHT/LEFT HEART CATH AND CORONARY ANGIOGRAPHY N/A 01/27/2024   Procedure: RIGHT/LEFT HEART CATH AND CORONARY ANGIOGRAPHY;  Surgeon: Zenaida Morene PARAS, MD;  Location: [facility] INVASIVE CV LAB;  Service: Cardiovascular;  Laterality: N/A;   SHOULDER OPEN ROTATOR CUFF REPAIR Right X 2   SUBMUCOSAL TATTOO INJECTION  05/30/2022   Procedure: SUBMUCOSAL TATTOO INJECTION;  Surgeon: Shaaron Lamar HERO, MD;  [facility];  Service: Endoscopy;;   SUBMUCOSAL TATTOO INJECTION  01/10/2023   Procedure: SUBMUCOSAL TATTOO INJECTION;  Surgeon: Wilhelmenia Aloha Raddle., MD;  Location: [facility];  Service: Gastroenterology;;   TEE WITHOUT CARDIOVERSION N/A 09/05/2015   Procedure: TRANSESOPHAGEAL ECHOCARDIOGRAM (TEE);  Surgeon: Wilbert JONELLE Bihari, MD;  Location: [facility] END[facility];  Service: Cardiovascular;  Laterality: N/A;   TEE WITHOUT CARDIOVERSION N/A 06/28/2022   Procedure: TRANSESOPHAGEAL ECHOCARDIOGRAM (TEE);  Surgeon: Wonda Sharper, MD;  Location: [facility] INVASIVE CV LAB;  Service: Cardiovascular;  Laterality: N/A;    Family History  Problem Relation Age of Onset   Heart disease Mother    Lung cancer Mother    Heart attack Mother 23   Diabetes Father    Heart disease Father    Stroke Brother    Healthy Daughter    Colon cancer Maternal Aunt        78s    Social History   Socioeconomic History   Marital status: Widowed    Spouse name: Not on file   Number of children: 1   Years of education: Not on file   Highest education level: GED or equivalent  Occupational History   Occupation: Retired  Tobacco Use   Smoking status: Former    Current packs/day: 0.00    Average packs/day: 2.0 packs/day for 27.0 years (54.0 ttl pk-yrs)    Types: Cigarettes    Start date: 5    Quit date: 1992    Years since quitting: 34.0    Passive exposure: Current   Smokeless tobacco: Never   Tobacco  comments:    Former smoker 09/27/21  Vaping Use   Vaping status: Never Used  Substance and Sexual Activity   Alcohol use: No    Alcohol/week: 0.0 standard drinks of alcohol    Comment: used to drink; stopped ~ 2008   Drug use: No   Sexual activity: Not Currently  Other Topics Concern   Not on file  Social  History Narrative   Lives in [place], [place] with wife.   Social Drivers of Health   Tobacco Use: Medium Risk (10/22/2024)   Patient History    Smoking Tobacco Use: Former    Smokeless Tobacco Use: Never    Passive Exposure: Current  Physicist, Medical Strain: Low Risk (02/10/2024)   Overall Financial Resource Strain (CARDIA)    Difficulty of Paying Living Expenses: Not very hard  Food Insecurity: No Food Insecurity (09/19/2024)   Epic    Worried About Programme Researcher, Broadcasting/film/video in the Last Year: Never true    Ran Out of Food in the Last Year: Never true  Transportation Needs: No Transportation Needs (09/19/2024)   Epic    Lack of Transportation (Medical): No    Lack of Transportation (Non-Medical): No  Physical Activity: Inactive (02/10/2024)   Exercise Vital Sign    Days of Exercise per Week: 0 days    Minutes of Exercise per Session: 0 min  Stress: No Stress Concern Present (02/10/2024)   [employer] of Occupational Health - Occupational Stress Questionnaire    Feeling of Stress : Only a little  Social Connections: Moderately Integrated (09/19/2024)   Social Connection and Isolation Panel    Frequency of Communication with Friends and Family: More than three times a week    Frequency of Social Gatherings with Friends and Family: More than three times a week    Attends Religious Services: More than 4 times per year    Active Member of [employer] or Organizations: Yes    Attends Banker Meetings: More than 4 times per year    Marital Status: Widowed  Intimate Partner Violence: Patient Unable To Answer (09/19/2024)   Epic    Fear of Current or Ex-Partner: Patient unable  to answer    Emotionally Abused: Patient unable to answer    Physically Abused: Patient unable to answer    Sexually Abused: Patient unable to answer  Depression (PHQ2-9): Low Risk (10/22/2024)   Depression (PHQ2-9)    PHQ-2 Score: 0  Alcohol Screen: Low Risk (01/27/2024)   Alcohol Screen    Last Alcohol Screening Score (AUDIT): 0  Housing: Low Risk (09/19/2024)   Epic    Unable to Pay for Housing in the Last Year: No    Number of Times Moved in the Last Year: 0    Homeless in the Last Year: No  Utilities: Not At Risk (09/19/2024)   Epic    Threatened with loss of utilities: No  Health Literacy: Adequate Health Literacy (11/20/2023)   B1300 Health Literacy    Frequency of need for help with medical instructions: Never    Outpatient Medications Prior to Visit  Medication Sig Dispense Refill   glimepiride  (AMARYL ) 2 MG tablet Take 2 mg by mouth 2 (two) times daily.     albuterol  (VENTOLIN  HFA) 108 (90 Base) MCG/ACT inhaler Inhale 2 puffs into the lungs every 6 (six) hours as needed for wheezing or shortness of breath. (Patient taking differently: Inhale 2 puffs into the lungs daily.) 18 g 3   ammonium lactate  (AMLACTIN DAILY) 12 % lotion Apply 1 Application topically as needed. 400 g 0   aspirin  81 MG chewable tablet Chew 81 mg by mouth daily.     camphor-menthol (ANTI-ITCH) lotion Apply 1 Application topically daily as needed for itching.     colchicine  0.6 MG tablet TAKE 2 TABLETS ON DAY 1, FOLLOWED BY 1 TABLET 1 HOUR LATER. PLEASE TAKE 1 TABLET ONCE DAILY FROM  DAY 2 TILL SWELLING IMPROVES. 90 tablet 1   COMBIGAN  0.2-0.5 % ophthalmic solution Place 1 drop into both eyes 2 (two) times daily.     dapagliflozin  propanediol (FARXIGA ) 10 MG TABS tablet Take 1 tablet (10 mg total) by mouth daily. 90 tablet 1   dorzolamide  (TRUSOPT ) 2 % ophthalmic solution 1 drop 2 (two) times daily.     ELDERBERRY PO Take 2 tablets by mouth daily. Chewable     ezetimibe  (ZETIA ) 10 MG tablet Take 1 tablet (10 mg  total) by mouth daily. 90 tablet 3   ferrous sulfate  325 (65 FE) MG tablet Take 1 tablet (325 mg total) by mouth daily with breakfast. 120 tablet 1   fluticasone  (FLONASE ) 50 MCG/ACT nasal spray Place 1 spray into both nostrils daily. SPRAY 2 SPRAYS INTO EACH NOSTRIL EVERY DAY 48 mL 1   magnesium  gluconate (MAGONATE) 500 MG tablet Take 500 mg by mouth daily.     mineral oil liquid Take 15 mLs by mouth daily as needed for moderate constipation.     OVER THE COUNTER MEDICATION Take 1 tablet by mouth daily. Beets Chew (Patient not taking: Reported on 10/19/2024)     pantoprazole  (PROTONIX ) 40 MG tablet TAKE 1 TABLET BY MOUTH EVERY DAY 90 tablet 1   torsemide  (DEMADEX ) 20 MG tablet 40 MG IN THE MORNING AND 20 MG IN THE EVENING (Patient taking differently: Take 20 mg by mouth 2 (two) times daily.) 270 tablet 1   vitamin E 45 MG (100 UNITS) capsule Take 100 Units by mouth daily.     zinc  gluconate 50 MG tablet Take 50 mg by mouth daily.     diltiazem  (CARDIZEM  CD) 120 MG 24 hr capsule Take 1 capsule (120 mg total) by mouth daily.     gabapentin  (NEURONTIN ) 300 MG capsule Take 1 capsule (300 mg total) by mouth 3 (three) times daily. (Patient taking differently: Take 100 mg by mouth 2 (two) times daily.) 90 capsule 2   Insulin  Glargine (BASAGLAR  KWIKPEN) 100 UNIT/ML INJECT 45 UNITS INTO THE SKIN AT BEDTIME. (Patient taking differently: Inject 44 Units into the skin at bedtime.) 45 mL 1   No facility-administered medications prior to visit.    Allergies[1]  ROS Review of Systems  Constitutional:  Positive for fatigue. Negative for chills and fever.  HENT:  Negative for congestion and sore throat.   Eyes:  Negative for pain and discharge.  Respiratory:  Positive for shortness of breath (Intermittent). Negative for cough.   Cardiovascular:  Positive for leg swelling. Negative for chest pain and palpitations.  Gastrointestinal:  Negative for diarrhea, nausea and vomiting.  Endocrine: Negative for  polydipsia and polyuria.  Genitourinary:  Negative for dysuria and hematuria.  Musculoskeletal:  Positive for arthralgias, back pain and gait problem. Negative for neck pain and neck stiffness.  Skin:  Negative for rash.  Neurological:  Negative for dizziness and weakness.  Psychiatric/Behavioral:  Negative for agitation and behavioral problems.       Objective:    Physical Exam Vitals reviewed.  Constitutional:      General: He is not in acute distress.    Appearance: He is obese. He is not diaphoretic.  HENT:     Head: Normocephalic and atraumatic.     Nose: No congestion.     Mouth/Throat:     Mouth: Mucous membranes are moist.  Eyes:     General: No scleral icterus.    Extraocular Movements: Extraocular movements intact.  Cardiovascular:  Rate and Rhythm: Normal rate and regular rhythm.     Heart sounds: Normal heart sounds. No murmur heard. Pulmonary:     Breath sounds: Normal breath sounds. No wheezing or rales.  Musculoskeletal:     Cervical back: Neck supple. No tenderness.     Right lower leg: Edema (Mild) present.     Left lower leg: Edema (Mild) present.  Skin:    General: Skin is warm.     Findings: Rash (Stasis dermatitis over bilateral legs) present.  Neurological:     General: No focal deficit present.     Mental Status: He is alert and oriented to person, place, and time.     Sensory: No sensory deficit.     Motor: No weakness.  Psychiatric:        Mood and Affect: Mood normal.        Behavior: Behavior normal.     BP 117/75   Pulse 67   Wt 222 lb 6.4 oz (100.9 kg)   SpO2 98%   BMI 31.91 kg/m  Wt Readings from Last 3 Encounters:  10/22/24 222 lb 6.4 oz (100.9 kg)  09/29/24 216 lb 11.4 oz (98.3 kg)  08/12/24 224 lb (101.6 kg)    Lab Results  Component Value Date   TSH 3.480 08/13/2024   Lab Results  Component Value Date   WBC 11.2 (H) 09/23/2024   HGB 11.1 (L) 09/23/2024   HCT 39.5 09/23/2024   MCV 84.2 09/23/2024   PLT 363  09/23/2024   Lab Results  Component Value Date   NA 139 09/29/2024   K 3.9 09/29/2024   CO2 35 (H) 09/29/2024   GLUCOSE 154 (H) 09/29/2024   BUN 16 09/29/2024   CREATININE 1.08 09/29/2024   BILITOT 0.8 09/21/2024   ALKPHOS 98 09/21/2024   AST 27 09/21/2024   ALT 13 09/21/2024   PROT 6.0 (L) 09/21/2024   ALBUMIN 3.0 (L) 09/21/2024   CALCIUM  8.5 (L) 09/29/2024   ANIONGAP 4 (L) 09/29/2024   EGFR 70 05/20/2024   GFR 76.23 05/04/2015   Lab Results  Component Value Date   CHOL 133 01/24/2024   Lab Results  Component Value Date   HDL 26 (L) 01/24/2024   Lab Results  Component Value Date   LDLCALC 89 01/24/2024   Lab Results  Component Value Date   TRIG 91 01/24/2024   Lab Results  Component Value Date   CHOLHDL 5.1 01/24/2024   Lab Results  Component Value Date   HGBA1C 11.1 (H) 09/19/2024      Assessment & Plan:   Problem List Items Addressed This Visit       Cardiovascular and Mediastinum   Paroxysmal atrial fibrillation (HCC) - Primary   Currently in sinus rhythm On diltiazem  120 mg QD, refilled Has watchman device in place, had GI bleeding due to Eliquis       Relevant Medications   diltiazem  (CARDIZEM  CD) 120 MG 24 hr capsule   Other Relevant Orders   TSH   CMP14+EGFR   CBC with Differential/Platelet   Chronic combined systolic and diastolic CHF (congestive heart failure) (HCC)   On torsemide  40 mg QAM and 20 mg QPM now, he reports improved leg swelling Daily weight checks Followed by cardiology Held Coreg  due to bradycardia and lisinopril  due to AKI by Cardiology On Farxiga  10 mg daily      Relevant Medications   diltiazem  (CARDIZEM  CD) 120 MG 24 hr capsule     Endocrine   Type  2 diabetes mellitus with diabetic neuropathy, with long-term current use of insulin  (HCC)   Has decreased sensation in certain areas of foot Needs toenail trimming as well - has thick toenails Had referred to podiatry Refilled gabapentin  100 mg 3 times daily       Relevant Medications   insulin  degludec (TRESIBA  FLEXTOUCH) 100 UNIT/ML FlexTouch Pen   gabapentin  (NEURONTIN ) 100 MG capsule   glimepiride  (AMARYL ) 2 MG tablet   Type 2 diabetes mellitus with hyperglycemia, with long-term current use of insulin  (HCC)   Lab Results  Component Value Date   HGBA1C 11.1 (H) 09/19/2024   Uncontrolled, likely due to diet noncompliance Associated with diabetic retinopathy, HTN, CAD and HLD On Basaglar  44 U at bedtime, Farxiga  10 mg QD  Glimepiride  2 mg BID was discontinued during recent hospitalization  Basaglar  not covered by insurance now, switched to Tresiba , increase dose to 50 units QD Restart glimepiride  2 mg QD Does not want to start GLP1 agonist Advised to follow diabetic diet On ACEi F/u CMP and lipid panel Diabetic eye exam: Advised to follow up with Ophthalmology for diabetic eye exam      Relevant Medications   insulin  degludec (TRESIBA  FLEXTOUCH) 100 UNIT/ML FlexTouch Pen   glimepiride  (AMARYL ) 2 MG tablet   Other Relevant Orders   Hemoglobin A1c   CMP14+EGFR     Genitourinary   CKD stage 3a, GFR 45-59 ml/min (HCC) - baseline Scr 1.2-1.4   Likely related to type II DM Last BMP reviewed, GFR usually stays around 45-50 Has history of CHF, on torsemide  Was on ACE inhibitor, had to be discontinued due to AKI Advised to maintain adequate hydration      Relevant Orders   VITAMIN D 25 Hydroxy (Vit-D Deficiency, Fractures)   CMP14+EGFR   CBC with Differential/Platelet     Other   Hyperlipidemia   On Lopid  Check lipid profile      Relevant Medications   diltiazem  (CARDIZEM  CD) 120 MG 24 hr capsule   Other Relevant Orders   Lipid panel   Hospital discharge follow-up   Hospital chart reviewed, including discharge summary Medications reconciled and reviewed with the patient in detail      Idiopathic chronic gout without tophus   Has had gout flareups at times, likely related to diet Refilled colchicine  to be used as needed  for acute flareups Check uric acid level      Relevant Orders   Uric acid   Sepsis (HCC)   Resolved now Was likely due to lobar pneumonia Was given IV antibiotics - Rocephin  and doxycycline  in the hospital Check CXR after 4 weeks      Other Visit Diagnoses       Lobar pneumonia       Relevant Orders   DG Chest 2 View       Meds ordered this encounter  Medications   insulin  degludec (TRESIBA  FLEXTOUCH) 100 UNIT/ML FlexTouch Pen    Sig: Inject 50 Units into the skin daily.    Dispense:  45 mL    Refill:  1   diltiazem  (CARDIZEM  CD) 120 MG 24 hr capsule    Sig: Take 1 capsule (120 mg total) by mouth daily.    Dispense:  90 capsule    Refill:  1   gabapentin  (NEURONTIN ) 100 MG capsule    Sig: Take 1 capsule (100 mg total) by mouth 3 (three) times daily.    Dispense:  270 capsule    Refill:  3  glimepiride  (AMARYL ) 2 MG tablet    Sig: Take 1 tablet (2 mg total) by mouth daily with breakfast.    Dispense:  90 tablet    Refill:  1    Follow-up: Return in about 2 months (around 12/20/2024) for Annual physical.    Suzzane MARLA Blanch, MD     [1]  Allergies Allergen Reactions   Silicone Dermatitis   Other Dermatitis and Other (See Comments)    Adhesive agent (substance)   Azithromycin  Nausea Only   Tape Rash and Other (See Comments)    Causes skin redness, Use paper tape only.   "

## 2024-10-22 NOTE — Assessment & Plan Note (Signed)
 Hospital chart reviewed, including discharge summary Medications reconciled and reviewed with the patient in detail

## 2024-10-22 NOTE — Assessment & Plan Note (Signed)
 Currently in sinus rhythm On diltiazem  120 mg QD, refilled Has watchman device in place, had GI bleeding due to Eliquis 

## 2024-10-27 ENCOUNTER — Telehealth: Payer: Self-pay

## 2024-10-27 NOTE — Telephone Encounter (Signed)
 Copied from CRM [id number]. Topic: Clinical - Medical Advice >> Oct 27, 2024  3:22 PM Winona SAUNDERS wrote: Oneil PT from [facility] calling to report weight changes in pt. 01/07- 211 Pt report weight today - 218. Please contact the pt if needed

## 2024-10-28 NOTE — Telephone Encounter (Signed)
 Pt informed/verbalized understanding

## 2024-10-29 ENCOUNTER — Ambulatory Visit: Attending: Nurse Practitioner | Admitting: Nurse Practitioner

## 2024-10-29 ENCOUNTER — Encounter: Payer: Self-pay | Admitting: Nurse Practitioner

## 2024-10-29 VITALS — BP 110/68 | HR 57 | Ht 71.0 in | Wt 223.4 lb

## 2024-10-29 DIAGNOSIS — R600 Localized edema: Secondary | ICD-10-CM | POA: Diagnosis not present

## 2024-10-29 DIAGNOSIS — I272 Pulmonary hypertension, unspecified: Secondary | ICD-10-CM

## 2024-10-29 DIAGNOSIS — I2510 Atherosclerotic heart disease of native coronary artery without angina pectoris: Secondary | ICD-10-CM

## 2024-10-29 DIAGNOSIS — R5383 Other fatigue: Secondary | ICD-10-CM | POA: Diagnosis not present

## 2024-10-29 DIAGNOSIS — R0609 Other forms of dyspnea: Secondary | ICD-10-CM | POA: Diagnosis not present

## 2024-10-29 DIAGNOSIS — I5020 Unspecified systolic (congestive) heart failure: Secondary | ICD-10-CM

## 2024-10-29 DIAGNOSIS — G4733 Obstructive sleep apnea (adult) (pediatric): Secondary | ICD-10-CM | POA: Diagnosis not present

## 2024-10-29 DIAGNOSIS — I10 Essential (primary) hypertension: Secondary | ICD-10-CM

## 2024-10-29 DIAGNOSIS — I4819 Other persistent atrial fibrillation: Secondary | ICD-10-CM

## 2024-10-29 MED ORDER — TORSEMIDE 20 MG PO TABS: 80.0000 mg | ORAL_TABLET | Freq: Every day | ORAL | 1 refills | Status: AC

## 2024-10-29 NOTE — Progress Notes (Addendum)
 " Cardiology Office Note:  .   Date:  10/29/2024 ID:  Lucas Dudley, DOB 07/24/1941, MRN [id number] PCP: Lucas Suzzane POUR, MD  [facility] Providers Cardiologist:  [name], MD Electrophysiologist:  [name], MD (Inactive)    History of Present Illness: .   Lucas Dudley is a 84 y.o. male with a PMH of CAD, chronic CHF, persistent A-fib, s/p Watchman implantation in 2023, AVM, HTN, T2DM, mixed HLD, OSA, and former smoker, who presents today for hospital follow-up.   Last seen by Dr. Sierras on December 04, 2023.  At the time, patient noted gradually worsening DOE and fatigue over the last few months.  Was noted to have a 20 pound weight gain since last office visit in August 2024.  Carvedilol  was discontinued, Demadex  was increased to 40 mg twice daily for the next 3 to 4 days, then changed to standing dose of 40 mg in the morning and 20 mg in the afternoon.  Potassium supplement was started.  Echocardiogram was ordered to be updated.  Hospital stay in early March 2025 for acute hypoxic respiratory failure and acute on chronic CHF exacerbation.  CT scan was negative for acute PE.  Showed cardiomegaly with small pleural effusions, nonspecific mild mediastinal and right hilar adenopathy, scattered punctate pulmonary nodules measuring less than 5 mm.  Was recommended to consider noncontrast CT of the chest that could possibly consider in 12 months the patient was considered to be high risk.  TTE revealed EF 55 to 60%, moderate LVH, mildly reduced right ventricular systolic function, severely elevated PASP, estimated right ventricular systolic pressure at 62.6 mmHg.   Today presents for hospital follow-up.  He is also pending endoscopy later this month with GI. He states he had a recent fall at home after chair was slick and fell out of chair at home, neighbor came and helped him up. Denies any syncope, acute injuries, or head injuries. Says overall he is doing fairly well, breathing  better since leaving the hospital, but does admit to weakness, says he feels like he lost his upper body strength. Denies any chest pain, shortness of breath, palpitations, syncope, presyncope, dizziness, orthopnea, PND, swelling or significant weight changes, acute bleeding, or claudication.  Saw Dr. Sierras after hospital follow-up. Set up for heart cath - see report.   Hospitalized 01/13/2024 -  01/28/2024 for acute hypoxic respiratory failure and decompensated CHF, pulmonary HTN. Felt to have likely undiagnosed COPD/OSA contributing to pulm HTN. Did well with diuresis. Started on Jardiance. Ujnderwent RHC/LHC 01/27/2024 that showed nonobstructive CAD- see full report below. IV iron  by hem/onc.   See by HF clinic on 02/04/2024 - volume status appeared low. Was cut back on his diuretic. Stopped Amio. EKG that day showed A-fib, 42 bpm. Getting iron  infusions with Hem/Onc. sCr elevated to 2.25, potassium 5.7. Was instructed to stop Aldactone  and potassium, hold Torsemide  and then restart Demedex at 20 mg BID and to recheck labs in 1 week. Repeat labs were much better.   Last seen by Dr. Sierras on 08/12/2024. Pt noted leg fatigue, felt to have possible component of neuropathy. Weights fluctuating up and down. Denied any angina or increasing SHOB.   Hospitalized 09/2024 d/t falls, had been having difficulty for past few months with lower extremity weakness and legs giving out.  Daughter called EMS to check on him because she had not heard from him in a couple of days.  EMS found patient to have right knee swelling and complaint of  shortness of breath and knee pain.  In the ED, found to be A-fib with RVR.  Chest x-ray suspicious for atelectasis and/or pneumonia.  Started on IV diltiazem , IV antibiotics and Lasix , IV fluids.  Trauma workup revealed normal pelvis imaging, right knee imaging revealed small suprapatellar joint effusion.  Echo revealed EF 40 to 45%, mildly reduced RVEF, MRI of spine showed spinal  stenosis of lumbar area with bulging disc, MRI of brain negative for CVA.  Recommended for SNF placement.  Today he is here for follow-up.  He states does have low energy, wonders if his iron  levels are low. Says he had a brief episode of chest pain yesterday and says he belched, and then symptoms were resolved. Very seldomly notices being short of breath. Does admit to recent issues of leg edema. Denies any palpitations, syncope, presyncope, dizziness, orthopnea, PND, significant weight changes, acute bleeding, or claudication.   ROS: Negative. See HPI.   Studies Reviewed: SABRA    EKG: EKG is not ordered today.   Echo 09/2024:  1. Left ventricular ejection fraction, by estimation, is 40 to 45%. Left  ventricular ejection fraction by 2D MOD biplane is 44.8 %. The left  ventricle has mildly decreased function. The left ventricle demonstrates  global hypokinesis. There is mild left  ventricular hypertrophy. Left ventricular diastolic function could not be  evaluated.   2. Right ventricular systolic function is mildly reduced. The right  ventricular size is mildly enlarged. There is severely elevated pulmonary  artery systolic pressure. The estimated right ventricular systolic  pressure is 73.1 mmHg.   3. Left atrial size was moderately dilated.   4. Right atrial size was mildly dilated.   5. The mitral valve was not well visualized. Moderate mitral valve  regurgitation.   6. Tricuspid valve regurgitation is mild to moderate.   7. The aortic valve is tricuspid. Aortic valve regurgitation is not  visualized. Aortic valve sclerosis/calcification is present, without any  evidence of aortic stenosis.   8. The inferior vena cava is dilated in size with <50% respiratory  variability, suggesting right atrial pressure of 15 mmHg.   9. Rhythm strip during this exam demonstrates atrial fibrillation.   Comparison(s): Changes from prior study are noted. 02/11/2024: LVEF 55-60%.  Echo limited  02/11/2024:   1. Limited study.   2. Left ventricular ejection fraction, by estimation, is 55 to 60%. The  left ventricle has normal function. The left ventricle has no regional  wall motion abnormalities. There is moderate concentric left ventricular  hypertrophy. Left ventricular diastolic parameters are indeterminate.   3. Right ventricular systolic function is mildly reduced. The right  ventricular size is normal. There is moderately elevated pulmonary artery systolic pressure. The estimated right ventricular systolic pressure is 47.4 mmHg.   4. A small pericardial effusion is present. The pericardial effusion is  posterior to the left ventricle.   5. Mild mitral valve regurgitation.   6. The aortic valve is tricuspid. There is mild calcification of the  aortic valve.   7. The inferior vena cava is normal in size with <50% respiratory  variability, suggesting right atrial pressure of 8 mmHg.   Comparison(s): A prior study was performed on 12/15/2023. Prior images  reviewed side by side. LVEF normal range at 55-60%. Mild RV dysfunction. Estimated RVSP of 47.4 mmHg has decreased in comparison.  Right/left heart cath 01/27/2024:    Prox RCA to Mid RCA lesion is 40% stenosed.   RPDA lesion is 60% stenosed.  HEMODYNAMICS: RA:       12 mmHg (mean) RV:       61/8, 12 mmHg PA:       63/17 mmHg (32 mean) PCWP: 16 mmHg (mean) with v waves to 26                                      Estimated Fick CO/CI   5.78L/min, 2.59L/min/m2                                            TPG  16  mmHg                                               PVR  2.7 Wood Units  PAPi  3.83       IMPRESSION: Moderate, nonobstructive coronary artery disease with mild progression from 2016. 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 Well compensated RV  function. Normal cardiac output/index   RECOMMENDATIONS: Results conveyed to ordering cardiologist. Continued diuresis and evaluated for group III PH given extensive smoking history and OSA  Echo 12/2023:  1. Left ventricular ejection fraction, by estimation, is 55 to 60%. Left  ventricular ejection fraction by PLAX is 59 %. The left ventricle has  normal function. The left ventricle has no regional wall motion  abnormalities. There is moderate concentric  left ventricular hypertrophy. Left ventricular diastolic parameters are  indeterminate.   2. Right ventricular systolic function is mildly reduced. The right  ventricular size is normal. There is severely elevated pulmonary artery  systolic pressure. The estimated right ventricular systolic pressure is  62.6 mmHg.   3. The mitral valve is grossly normal. No evidence of mitral valve  regurgitation.   4. The aortic valve is tricuspid. Aortic valve regurgitation is not  visualized. Aortic valve sclerosis/calcification is present, without any  evidence of aortic stenosis.   5. The inferior vena cava is dilated in size with <50% respiratory  variability, suggesting right atrial pressure of 15 mmHg.   Comparison(s): Changes from prior study are noted. 06/06/2022: LVEF 60-65%, mild LVH, low normal RV function.   CT cardiac 08/2022: IMPRESSION: 1.  Mild bi atrial enlargement   2.  Trans septal puncture site sealed with no shunt   3. 31 mm Watchman FLX with 1/3 medial shoulder and 2.2 mm medial gap. Device is well endothelialized at surface. Average compression 15% Distal 1/3 of device and LAA not thrombosed and accepts contrast   4.  No pericardial effusion   5.  Normal ascending thoracic aorta 3.4 cm   6.  Normal PV anatomy  IMPRESSION: 1. Mildly prominent subcarinal lymph node, 1.6 cm in short axis, previously 2.1 cm on 05/31/2021. This is nonspecific but could be reactive. 2. Mild peripheral subpleural reticulation in the  lungs, cannot exclude mild fibrosis. 3. Healing subacute right lateral rib fractures. Old left lateral lower rib fractures. 4. Scattered mild scarring or atelectasis and peripheral subpleural reticulation, unchanged from prior, cannot exclude mild fibrosis. 5. Descending thoracic aortic atherosclerotic vascular disease. 6.  Mild peripheral scarring or atelectasis in both lungs. 7. Mild mediastinal lipomatosis. This is a benign incidental finding. 8. Aortic atherosclerosis.   Aortic Atherosclerosis (ICD10-I70.0).  Lexiscan  01/2020:  Nuclear stress EF: 31%. There was no ST segment deviation noted during stress. Findings consistent with prior myocardial infarction. This is a high risk study. The left ventricular ejection fraction is moderately decreased (30-44%).   Low exercise tolerance (3 METs), unable to reach target heart rate, so study converted to pharmacologic. Baseline blood pressure very elevated at baseline (198/96). There are diffuse perfusion defects at rest and with stress, sparing only the mid-lateral wall. There is no significant reversibility on this study. Based on distribution, would be concerned that this is artifact. However, with low EF, suggests diffuse areas of poor perfusion without significant reversibility.   LHC 03/2015:  IMPRESSION:Mr. Cisnero has noncritical CAD with severe LV dysfunction. I think that his inferior perfusion abnormality was artifactual. I do not think his moderate proximal LAD lesion is hemostatically significant. 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 The patient received 5000 units of heparin  intravenously. He received radial cocktail. The SideArm sheath. A total of 55 mL of contrast was used for the case. The sheath was removed and a TR band was placed on the right wrist to achieve patent hemostasis. The patient left the lab in stable condition. He'll be discharged home in 2 hours and will follow-up with  me in the office in several weeks.  Cardiac monitor 02/2015: Paroxysmal atrial fib   The patient was in atrial fib for a significant amount of the monitored time. Many days he had atrial fib for almost the entire day.   He had several pauses of > 2 seconds.  No prolonged pauses that would cause syncope  Physical Exam:   VS:  BP 110/68 (BP Location: Right Arm)   Pulse (!) 57   Ht 5' 11 (1.803 m)   Wt 223 lb 6.4 oz (101.3 kg)   SpO2 100%   BMI 31.16 kg/m    Wt Readings from Last 3 Encounters:  10/29/24 223 lb 6.4 oz (101.3 kg)  10/22/24 222 lb 6.4 oz (100.9 kg)  09/29/24 216 lb 11.4 oz (98.3 kg)    GEN: Obese, 84 y.o. male in no acute distress NECK: No JVD; No carotid bruits CARDIAC: S1/S2, RRR, no murmurs, rubs, gallops RESPIRATORY:  Clear and diminished to auscultation without rales, wheezing or rhonchi  ABDOMEN: Soft, non-tender, non-distended EXTREMITIES:  Nonpitting edema to BLE; No deformity   ASSESSMENT AND PLAN: .    1. HFmrEF, DOE, pulmonary hypertension, leg edema Stage C, NYHA class I-II symptoms. DOE etiology is multifactorial. EF 40-45% 09/2024. WHO Group 2/3. Does show leg edema on exam. Came to shared medical decision with patient to take Torsemide  80 mg daily or 40 mg BID and will obtain proBNP, BMET, and Mag in 1 week. Recent right and left heart cath noted above.  Continue rest of medication regimen. Low sodium diet, fluid restriction <2L, and daily weights encouraged. Educated to contact our office for weight gain of 2 lbs overnight or 5 lbs in one week.   3. CAD Recent atypical episode, sounds r/t GERD. Hx of occluded first diagonal and moderate LAD/PDA dx, has been medically managed.  Most recent heart cath in April 2025 revealed moderate, nonobstructive CAD with mild progression from 2016.  No intervention performed.  Stable with no anginal symptoms. No medication changes at this time. Heart healthy  diet and regular cardiovascular exercise encouraged.   4.  Persistent A-fib, s/p Watchman implantation in 2023 Denies any tachycardia or palpitations. HR is well controlled without AV nodal blockers. Doing well s/p Watchman implantation in 2023. Continue ASA, not on OAC.  Heart healthy diet and regular cardiovascular exercise encouraged. Continue to follow-up with EP.   5. HTN BP well controlled. Discussed to monitor BP at home at least 2 hours after medications and sitting for 5-10 minutes. No medication changes at this time. Heart healthy diet and regular cardiovascular exercise encouraged.   6. HLD LDL 83 01/2024. Continue current medication regimen. Heart healthy diet and regular cardiovascular exercise encouraged.   7. OSA on CPAP, pulmonary HTN Recent right and left heart cath noted above. Previously recommended to continue diuresis and to evaluate for group 3 PH given his OSA and extensive smoking hx. Encouraged continued compliance with CPAP. Continue to follow-up with Dr. Shlomo.   8. Fatigue Etiology multifactorial. Follows Hem/Onc for IDA. Continue to follow with PCP.    Dispo: Care and ED precautions discussed. Follow-up with me/APP in 4-6 weeks or sooner if anything changes.   Signed, Almarie Crate, NP   "

## 2024-10-29 NOTE — Patient Instructions (Addendum)
 Medication Instructions:  Your physician has recommended you make the following change in your medication:  Increase Torsemide  80 mg daily (Can take 2 tablets in the a.m and 2 tablets in the p.m) Continue taking all there medications as prescribed   Labwork: BMET, Magnesium  and ProBNP to be completed at [facility] in one week (11/05/2024)  Testing/Procedures: None  Follow-Up: Your physician recommends that you schedule a follow-up appointment in: 4-6 weeks  Any Other Special Instructions Will Be Listed Below (If Applicable). Thank you for choosing [facility]!     If you need a refill on your cardiac medications before your next appointment, please call your pharmacy.

## 2024-10-30 ENCOUNTER — Encounter: Payer: Self-pay | Admitting: Physician Assistant

## 2024-10-30 ENCOUNTER — Encounter: Payer: Self-pay | Admitting: Gastroenterology

## 2024-11-05 ENCOUNTER — Telehealth: Payer: Self-pay

## 2024-11-05 NOTE — Telephone Encounter (Unsigned)
 Copied from CRM [id number]. Topic: General - Other >> Nov 05, 2024  4:01 PM Victoria B wrote: Reason for CRM: Oneil from [place], reporting patient's weight today is 220 , the weekend was 217

## 2024-11-06 NOTE — Telephone Encounter (Signed)
 Will contact cardiologist office due to Dr. Tobie being out of the office at this time.

## 2024-11-15 ENCOUNTER — Other Ambulatory Visit: Payer: Self-pay | Admitting: Internal Medicine

## 2024-11-15 DIAGNOSIS — J309 Allergic rhinitis, unspecified: Secondary | ICD-10-CM

## 2024-11-17 ENCOUNTER — Other Ambulatory Visit (HOSPITAL_COMMUNITY)
Admission: RE | Admit: 2024-11-17 | Discharge: 2024-11-17 | Disposition: A | Source: Ambulatory Visit | Attending: Nurse Practitioner | Admitting: Nurse Practitioner

## 2024-11-17 DIAGNOSIS — I5020 Unspecified systolic (congestive) heart failure: Secondary | ICD-10-CM

## 2024-11-17 LAB — MAGNESIUM: Magnesium: 2.4 mg/dL (ref 1.7–2.4)

## 2024-11-17 LAB — BASIC METABOLIC PANEL WITH GFR
Anion gap: 10 (ref 5–15)
BUN: 20 mg/dL (ref 8–23)
CO2: 33 mmol/L — ABNORMAL HIGH (ref 22–32)
Calcium: 8.9 mg/dL (ref 8.9–10.3)
Chloride: 99 mmol/L (ref 98–111)
Creatinine, Ser: 1.11 mg/dL (ref 0.61–1.24)
GFR, Estimated: >60 mL/min
Glucose, Bld: 84 mg/dL (ref 70–99)
Potassium: 3 mmol/L — ABNORMAL LOW (ref 3.5–5.1)
Sodium: 141 mmol/L (ref 135–145)

## 2024-11-17 LAB — PRO BRAIN NATRIURETIC PEPTIDE: Pro Brain Natriuretic Peptide: 2029 pg/mL — ABNORMAL HIGH

## 2024-11-18 ENCOUNTER — Ambulatory Visit: Payer: Self-pay | Admitting: Nurse Practitioner

## 2024-11-18 DIAGNOSIS — E876 Hypokalemia: Secondary | ICD-10-CM

## 2024-11-18 MED ORDER — POTASSIUM CHLORIDE CRYS ER 20 MEQ PO TBCR: 40.0000 meq | EXTENDED_RELEASE_TABLET | Freq: Two times a day (BID) | ORAL | 3 refills | Status: AC

## 2024-11-18 NOTE — Telephone Encounter (Signed)
 The patient has been notified of the result and verbalized understanding.  All questions (if any) were answered. Prescription sent to pharmacy and lab ordered placed at [facility] CHRISTELLA Croak, CMA 11/18/2024 1:48 PM

## 2024-11-18 NOTE — Telephone Encounter (Signed)
-----   Message from Almarie Crate, NP sent at 11/18/2024 10:57 AM EST ----- proBNP is still quite elevated but improved from 2 months ago.  Potassium level is low.  Please begin potassium chloride  40 mill equivalent tablet BID.  Continue torsemide . Repeat BMET in 1-2 weeks.   Thanks!  Almarie Crate, AGNP-C

## 2024-11-19 ENCOUNTER — Encounter: Payer: Self-pay | Admitting: Podiatry

## 2024-11-19 ENCOUNTER — Ambulatory Visit: Admitting: Podiatry

## 2024-11-19 DIAGNOSIS — E1169 Type 2 diabetes mellitus with other specified complication: Secondary | ICD-10-CM

## 2024-11-19 DIAGNOSIS — B351 Tinea unguium: Secondary | ICD-10-CM

## 2024-11-19 NOTE — Progress Notes (Signed)
 This patient returns to my office for at risk foot care.  This patient requires this care by a professional since this patient will be at risk due to having type 2 diabetes, CKD and coagulation defect.  This patient is unable to cut nails himself since the patient cannot reach his nails.These nails are painful walking and wearing shoes.  This patient presents for at risk foot care today.  General Appearance  Alert, conversant and in no acute stress.  Vascular  Dorsalis pedis and posterior tibial  pulses are palpable  bilaterally.  Capillary return is within normal limits  bilaterally. Temperature is within normal limits  bilaterally.  Neurologic  Senn-Weinstein monofilament wire test within normal limits  bilaterally. Muscle power within normal limits bilaterally.  Nails Thick disfigured discolored nails with subungual debris  from hallux to fifth toes bilaterally. No evidence of bacterial infection or drainage bilaterally.  Orthopedic  No limitations of motion  feet .  No crepitus or effusions noted.  No bony pathology or digital deformities noted.  Skin  normotropic skin with no porokeratosis noted bilaterally.  No signs of infections or ulcers noted.     Onychomycosis  Pain in right toes  Pain in left toes  Consent was obtained for treatment procedures.   Mechanical debridement of nails 1-5  bilaterally performed with a nail nipper.  Filed with dremel without incident.    Return office visit    3 months                  Told patient to return for periodic foot care and evaluation due to potential at risk complications.   Cordella Bold DPM  tomma

## 2024-11-23 ENCOUNTER — Encounter: Payer: Medicare Other | Admitting: Internal Medicine

## 2024-11-23 ENCOUNTER — Ambulatory Visit

## 2024-11-24 ENCOUNTER — Ambulatory Visit: Admitting: Cardiology

## 2024-12-04 ENCOUNTER — Encounter (INDEPENDENT_AMBULATORY_CARE_PROVIDER_SITE_OTHER): Admitting: Ophthalmology

## 2024-12-23 ENCOUNTER — Inpatient Hospital Stay: Attending: Hematology

## 2024-12-30 ENCOUNTER — Inpatient Hospital Stay: Admitting: Physician Assistant

## 2025-01-07 ENCOUNTER — Encounter: Payer: Self-pay | Admitting: Internal Medicine

## 2025-03-18 ENCOUNTER — Ambulatory Visit: Admitting: Podiatry
